# Patient Record
Sex: Female | Born: 1940 | Race: White | Hispanic: No | Marital: Married | Smoking: Former smoker
Health system: Southern US, Community
[De-identification: ages and names within clinical notes are randomized; demographics above are authoritative.]

## PROBLEM LIST (undated history)

## (undated) DIAGNOSIS — I4819 Other persistent atrial fibrillation: Secondary | ICD-10-CM

## (undated) DIAGNOSIS — E785 Hyperlipidemia, unspecified: Secondary | ICD-10-CM

## (undated) DIAGNOSIS — I351 Nonrheumatic aortic (valve) insufficiency: Secondary | ICD-10-CM

## (undated) DIAGNOSIS — I272 Pulmonary hypertension, unspecified: Secondary | ICD-10-CM

## (undated) DIAGNOSIS — C801 Malignant (primary) neoplasm, unspecified: Secondary | ICD-10-CM

## (undated) DIAGNOSIS — Z780 Asymptomatic menopausal state: Secondary | ICD-10-CM

## (undated) DIAGNOSIS — J392 Other diseases of pharynx: Secondary | ICD-10-CM

## (undated) DIAGNOSIS — J385 Laryngeal spasm: Secondary | ICD-10-CM

## (undated) DIAGNOSIS — I639 Cerebral infarction, unspecified: Secondary | ICD-10-CM

## (undated) DIAGNOSIS — K5790 Diverticulosis of intestine, part unspecified, without perforation or abscess without bleeding: Secondary | ICD-10-CM

## (undated) DIAGNOSIS — IMO0002 Reserved for concepts with insufficient information to code with codable children: Secondary | ICD-10-CM

## (undated) DIAGNOSIS — H409 Unspecified glaucoma: Secondary | ICD-10-CM

## (undated) DIAGNOSIS — N183 Chronic kidney disease, stage 3 unspecified: Secondary | ICD-10-CM

## (undated) DIAGNOSIS — E042 Nontoxic multinodular goiter: Secondary | ICD-10-CM

## (undated) DIAGNOSIS — K648 Other hemorrhoids: Secondary | ICD-10-CM

## (undated) DIAGNOSIS — I10 Essential (primary) hypertension: Secondary | ICD-10-CM

## (undated) DIAGNOSIS — R05 Cough: Secondary | ICD-10-CM

## (undated) DIAGNOSIS — J309 Allergic rhinitis, unspecified: Secondary | ICD-10-CM

## (undated) DIAGNOSIS — I340 Nonrheumatic mitral (valve) insufficiency: Secondary | ICD-10-CM

## (undated) DIAGNOSIS — F0390 Unspecified dementia without behavioral disturbance: Secondary | ICD-10-CM

## (undated) DIAGNOSIS — E039 Hypothyroidism, unspecified: Secondary | ICD-10-CM

## (undated) DIAGNOSIS — K449 Diaphragmatic hernia without obstruction or gangrene: Secondary | ICD-10-CM

## (undated) DIAGNOSIS — M1990 Unspecified osteoarthritis, unspecified site: Secondary | ICD-10-CM

## (undated) DIAGNOSIS — J45991 Cough variant asthma: Secondary | ICD-10-CM

## (undated) DIAGNOSIS — N882 Stricture and stenosis of cervix uteri: Secondary | ICD-10-CM

## (undated) DIAGNOSIS — R053 Chronic cough: Secondary | ICD-10-CM

## (undated) DIAGNOSIS — M8580 Other specified disorders of bone density and structure, unspecified site: Secondary | ICD-10-CM

## (undated) DIAGNOSIS — K219 Gastro-esophageal reflux disease without esophagitis: Secondary | ICD-10-CM

## (undated) DIAGNOSIS — I482 Chronic atrial fibrillation, unspecified: Secondary | ICD-10-CM

## (undated) DIAGNOSIS — I5032 Chronic diastolic (congestive) heart failure: Secondary | ICD-10-CM

## (undated) DIAGNOSIS — IMO0001 Reserved for inherently not codable concepts without codable children: Secondary | ICD-10-CM

## (undated) HISTORY — DX: Diverticulosis of intestine, part unspecified, without perforation or abscess without bleeding: K57.90

## (undated) HISTORY — PX: COLONOSCOPY: SHX174

## (undated) HISTORY — DX: Allergic rhinitis, unspecified: J30.9

## (undated) HISTORY — DX: Reserved for inherently not codable concepts without codable children: IMO0001

## (undated) HISTORY — DX: Unspecified glaucoma: H40.9

## (undated) HISTORY — DX: Chronic kidney disease, stage 3 (moderate): N18.3

## (undated) HISTORY — PX: BREAST SURGERY: SHX581

## (undated) HISTORY — DX: Reserved for concepts with insufficient information to code with codable children: IMO0002

## (undated) HISTORY — DX: Other specified disorders of bone density and structure, unspecified site: M85.80

## (undated) HISTORY — DX: Chronic kidney disease, stage 3 unspecified: N18.30

## (undated) HISTORY — DX: Chronic atrial fibrillation, unspecified: I48.20

## (undated) HISTORY — DX: Asymptomatic menopausal state: Z78.0

## (undated) HISTORY — DX: Essential (primary) hypertension: I10

## (undated) HISTORY — DX: Other persistent atrial fibrillation: I48.19

## (undated) HISTORY — DX: Stricture and stenosis of cervix uteri: N88.2

## (undated) HISTORY — PX: BREAST LUMPECTOMY: SHX2

## (undated) HISTORY — DX: Diaphragmatic hernia without obstruction or gangrene: K44.9

## (undated) HISTORY — DX: Gastro-esophageal reflux disease without esophagitis: K21.9

## (undated) HISTORY — DX: Other hemorrhoids: K64.8

## (undated) HISTORY — DX: Pulmonary hypertension, unspecified: I27.20

## (undated) HISTORY — DX: Other diseases of pharynx: J39.2

## (undated) HISTORY — DX: Nonrheumatic aortic (valve) insufficiency: I35.1

## (undated) HISTORY — DX: Chronic diastolic (congestive) heart failure: I50.32

## (undated) HISTORY — PX: EYE SURGERY: SHX253

## (undated) HISTORY — DX: Nonrheumatic mitral (valve) insufficiency: I34.0

## (undated) HISTORY — DX: Hyperlipidemia, unspecified: E78.5

## (undated) HISTORY — DX: Malignant (primary) neoplasm, unspecified: C80.1

## (undated) HISTORY — DX: Cough variant asthma: J45.991

## (undated) HISTORY — DX: Laryngeal spasm: J38.5

---

## 2001-01-19 ENCOUNTER — Ambulatory Visit (HOSPITAL_COMMUNITY): Admission: RE | Admit: 2001-01-19 | Discharge: 2001-01-19 | Payer: Self-pay | Admitting: Gastroenterology

## 2001-03-02 HISTORY — PX: LUNG CANCER SURGERY: SHX702

## 2005-05-22 ENCOUNTER — Encounter: Admission: RE | Admit: 2005-05-22 | Discharge: 2005-05-22 | Payer: Self-pay | Admitting: Family Medicine

## 2005-05-25 ENCOUNTER — Encounter: Admission: RE | Admit: 2005-05-25 | Discharge: 2005-05-25 | Payer: Self-pay | Admitting: Family Medicine

## 2005-05-27 ENCOUNTER — Ambulatory Visit: Payer: Self-pay | Admitting: Internal Medicine

## 2005-05-29 ENCOUNTER — Ambulatory Visit (HOSPITAL_COMMUNITY): Admission: RE | Admit: 2005-05-29 | Discharge: 2005-05-29 | Payer: Self-pay | Admitting: Internal Medicine

## 2005-06-19 ENCOUNTER — Encounter (INDEPENDENT_AMBULATORY_CARE_PROVIDER_SITE_OTHER): Payer: Self-pay | Admitting: *Deleted

## 2005-06-19 ENCOUNTER — Inpatient Hospital Stay (HOSPITAL_COMMUNITY): Admission: RE | Admit: 2005-06-19 | Discharge: 2005-06-23 | Payer: Self-pay | Admitting: Thoracic Surgery

## 2005-06-30 ENCOUNTER — Encounter: Admission: RE | Admit: 2005-06-30 | Discharge: 2005-06-30 | Payer: Self-pay | Admitting: Thoracic Surgery

## 2005-07-21 ENCOUNTER — Encounter: Admission: RE | Admit: 2005-07-21 | Discharge: 2005-07-21 | Payer: Self-pay | Admitting: Thoracic Surgery

## 2005-09-23 ENCOUNTER — Encounter: Admission: RE | Admit: 2005-09-23 | Discharge: 2005-09-23 | Payer: Self-pay | Admitting: Thoracic Surgery

## 2006-01-13 ENCOUNTER — Ambulatory Visit: Payer: Self-pay | Admitting: Family Medicine

## 2006-01-13 LAB — CONVERTED CEMR LAB
Chol/HDL Ratio, serum: 3
HCT: 42.1 % (ref 36.0–46.0)
Hemoglobin: 14.7 g/dL (ref 12.0–15.0)
MCHC: 35 g/dL (ref 30.0–36.0)
MCV: 91.3 fL (ref 78.0–100.0)
Platelets: 300 10*3/uL (ref 150–400)
RDW: 12 % (ref 11.5–14.6)
WBC: 5.1 10*3/uL (ref 4.5–10.5)

## 2006-01-25 ENCOUNTER — Ambulatory Visit: Payer: Self-pay | Admitting: Family Medicine

## 2006-01-25 LAB — CONVERTED CEMR LAB
Free T4: 0.9 ng/dL (ref 0.9–1.8)
TSH: 0.56 microintl units/mL (ref 0.35–5.50)

## 2006-01-27 ENCOUNTER — Encounter: Admission: RE | Admit: 2006-01-27 | Discharge: 2006-01-27 | Payer: Self-pay | Admitting: Thoracic Surgery

## 2006-05-03 ENCOUNTER — Ambulatory Visit: Payer: Self-pay | Admitting: Family Medicine

## 2006-05-12 ENCOUNTER — Ambulatory Visit: Payer: Self-pay | Admitting: Internal Medicine

## 2006-05-17 ENCOUNTER — Encounter: Admission: RE | Admit: 2006-05-17 | Discharge: 2006-05-17 | Payer: Self-pay | Admitting: Internal Medicine

## 2006-05-31 ENCOUNTER — Ambulatory Visit: Payer: Self-pay | Admitting: Family Medicine

## 2006-07-21 ENCOUNTER — Ambulatory Visit: Payer: Self-pay | Admitting: Family Medicine

## 2006-07-28 ENCOUNTER — Encounter: Admission: RE | Admit: 2006-07-28 | Discharge: 2006-07-28 | Payer: Self-pay | Admitting: Thoracic Surgery

## 2006-07-28 ENCOUNTER — Ambulatory Visit: Payer: Self-pay | Admitting: Thoracic Surgery

## 2006-07-29 DIAGNOSIS — J309 Allergic rhinitis, unspecified: Secondary | ICD-10-CM | POA: Insufficient documentation

## 2006-07-29 DIAGNOSIS — Z85118 Personal history of other malignant neoplasm of bronchus and lung: Secondary | ICD-10-CM | POA: Insufficient documentation

## 2007-01-12 ENCOUNTER — Ambulatory Visit: Payer: Self-pay | Admitting: Thoracic Surgery

## 2007-01-12 ENCOUNTER — Encounter: Admission: RE | Admit: 2007-01-12 | Discharge: 2007-01-12 | Payer: Self-pay | Admitting: Thoracic Surgery

## 2007-01-21 ENCOUNTER — Ambulatory Visit: Payer: Self-pay | Admitting: Family Medicine

## 2007-01-21 DIAGNOSIS — R053 Chronic cough: Secondary | ICD-10-CM | POA: Insufficient documentation

## 2007-01-21 DIAGNOSIS — R05 Cough: Secondary | ICD-10-CM

## 2007-01-21 DIAGNOSIS — D499 Neoplasm of unspecified behavior of unspecified site: Secondary | ICD-10-CM

## 2007-01-31 ENCOUNTER — Telehealth (INDEPENDENT_AMBULATORY_CARE_PROVIDER_SITE_OTHER): Payer: Self-pay | Admitting: *Deleted

## 2007-02-01 ENCOUNTER — Ambulatory Visit: Payer: Self-pay | Admitting: Family Medicine

## 2007-02-07 ENCOUNTER — Telehealth (INDEPENDENT_AMBULATORY_CARE_PROVIDER_SITE_OTHER): Payer: Self-pay | Admitting: *Deleted

## 2007-02-11 ENCOUNTER — Telehealth (INDEPENDENT_AMBULATORY_CARE_PROVIDER_SITE_OTHER): Payer: Self-pay | Admitting: *Deleted

## 2007-02-14 ENCOUNTER — Ambulatory Visit: Payer: Self-pay | Admitting: Family Medicine

## 2007-02-14 DIAGNOSIS — J392 Other diseases of pharynx: Secondary | ICD-10-CM

## 2007-02-25 ENCOUNTER — Telehealth (INDEPENDENT_AMBULATORY_CARE_PROVIDER_SITE_OTHER): Payer: Self-pay | Admitting: *Deleted

## 2007-02-25 ENCOUNTER — Ambulatory Visit: Payer: Self-pay | Admitting: Family Medicine

## 2007-03-04 ENCOUNTER — Ambulatory Visit: Payer: Self-pay | Admitting: Gastroenterology

## 2007-03-10 ENCOUNTER — Encounter (INDEPENDENT_AMBULATORY_CARE_PROVIDER_SITE_OTHER): Payer: Self-pay | Admitting: Family Medicine

## 2007-03-17 ENCOUNTER — Encounter (INDEPENDENT_AMBULATORY_CARE_PROVIDER_SITE_OTHER): Payer: Self-pay | Admitting: Family Medicine

## 2007-03-17 ENCOUNTER — Ambulatory Visit: Payer: Self-pay | Admitting: Gastroenterology

## 2007-03-17 LAB — HM COLONOSCOPY: HM Colonoscopy: NORMAL

## 2007-03-21 ENCOUNTER — Ambulatory Visit: Payer: Self-pay | Admitting: Internal Medicine

## 2007-03-24 ENCOUNTER — Ambulatory Visit: Payer: Self-pay | Admitting: Cardiology

## 2007-04-25 ENCOUNTER — Ambulatory Visit: Payer: Self-pay | Admitting: Internal Medicine

## 2007-05-06 ENCOUNTER — Other Ambulatory Visit: Admission: RE | Admit: 2007-05-06 | Discharge: 2007-05-06 | Payer: Self-pay | Admitting: Family Medicine

## 2007-05-06 ENCOUNTER — Ambulatory Visit: Payer: Self-pay | Admitting: Family Medicine

## 2007-05-06 ENCOUNTER — Encounter: Payer: Self-pay | Admitting: Family Medicine

## 2007-05-06 DIAGNOSIS — N882 Stricture and stenosis of cervix uteri: Secondary | ICD-10-CM | POA: Insufficient documentation

## 2007-05-06 DIAGNOSIS — N951 Menopausal and female climacteric states: Secondary | ICD-10-CM

## 2007-05-09 ENCOUNTER — Telehealth (INDEPENDENT_AMBULATORY_CARE_PROVIDER_SITE_OTHER): Payer: Self-pay | Admitting: *Deleted

## 2007-05-09 ENCOUNTER — Encounter: Payer: Self-pay | Admitting: Internal Medicine

## 2007-05-09 ENCOUNTER — Ambulatory Visit (HOSPITAL_COMMUNITY): Admission: RE | Admit: 2007-05-09 | Discharge: 2007-05-09 | Payer: Self-pay | Admitting: Internal Medicine

## 2007-05-16 ENCOUNTER — Encounter (INDEPENDENT_AMBULATORY_CARE_PROVIDER_SITE_OTHER): Payer: Self-pay | Admitting: *Deleted

## 2007-05-18 ENCOUNTER — Encounter: Payer: Self-pay | Admitting: Family Medicine

## 2007-05-19 ENCOUNTER — Encounter: Payer: Self-pay | Admitting: Family Medicine

## 2007-05-20 ENCOUNTER — Telehealth (INDEPENDENT_AMBULATORY_CARE_PROVIDER_SITE_OTHER): Payer: Self-pay | Admitting: *Deleted

## 2007-05-20 ENCOUNTER — Ambulatory Visit: Payer: Self-pay | Admitting: Family Medicine

## 2007-05-20 LAB — CONVERTED CEMR LAB
Bilirubin Urine: NEGATIVE
Blood in Urine, dipstick: NEGATIVE
Glucose, Urine, Semiquant: NEGATIVE
Nitrite: NEGATIVE
Protein, U semiquant: NEGATIVE
Urobilinogen, UA: 0.2
WBC Urine, dipstick: NEGATIVE
pH: 7.5

## 2007-05-24 ENCOUNTER — Ambulatory Visit: Payer: Self-pay | Admitting: Internal Medicine

## 2007-05-28 ENCOUNTER — Encounter: Payer: Self-pay | Admitting: Family Medicine

## 2007-05-28 DIAGNOSIS — M8580 Other specified disorders of bone density and structure, unspecified site: Secondary | ICD-10-CM

## 2007-05-28 LAB — CONVERTED CEMR LAB
AST: 24 units/L (ref 0–37)
Albumin: 4.2 g/dL (ref 3.5–5.2)
Basophils Absolute: 0 10*3/uL (ref 0.0–0.1)
Basophils Relative: 0.7 % (ref 0.0–1.0)
CO2: 30 meq/L (ref 19–32)
Chloride: 102 meq/L (ref 96–112)
Creatinine, Ser: 0.9 mg/dL (ref 0.4–1.2)
Direct LDL: 161.6 mg/dL
Eosinophils Relative: 0.1 % (ref 0.0–5.0)
Glucose, Bld: 97 mg/dL (ref 70–99)
HCT: 43.2 % (ref 36.0–46.0)
Hemoglobin: 14.6 g/dL (ref 12.0–15.0)
MCHC: 33.8 g/dL (ref 30.0–36.0)
Monocytes Absolute: 0.4 10*3/uL (ref 0.2–0.7)
Neutrophils Relative %: 54.8 % (ref 43.0–77.0)
Potassium: 4.1 meq/L (ref 3.5–5.1)
RBC: 4.73 M/uL (ref 3.87–5.11)
RDW: 11.6 % (ref 11.5–14.6)
Sodium: 138 meq/L (ref 135–145)
TSH: 0.36 microintl units/mL (ref 0.35–5.50)
Total Bilirubin: 1 mg/dL (ref 0.3–1.2)
Total CHOL/HDL Ratio: 3.2
Total Protein: 7.1 g/dL (ref 6.0–8.3)
Triglycerides: 56 mg/dL (ref 0–149)
WBC: 4.4 10*3/uL — ABNORMAL LOW (ref 4.5–10.5)

## 2007-05-30 ENCOUNTER — Telehealth (INDEPENDENT_AMBULATORY_CARE_PROVIDER_SITE_OTHER): Payer: Self-pay | Admitting: *Deleted

## 2007-05-31 ENCOUNTER — Encounter (INDEPENDENT_AMBULATORY_CARE_PROVIDER_SITE_OTHER): Payer: Self-pay | Admitting: *Deleted

## 2007-06-06 ENCOUNTER — Encounter: Payer: Self-pay | Admitting: Family Medicine

## 2007-06-09 ENCOUNTER — Encounter: Payer: Self-pay | Admitting: Family Medicine

## 2007-06-27 ENCOUNTER — Ambulatory Visit: Payer: Self-pay | Admitting: Pulmonary Disease

## 2007-06-27 ENCOUNTER — Telehealth (INDEPENDENT_AMBULATORY_CARE_PROVIDER_SITE_OTHER): Payer: Self-pay | Admitting: *Deleted

## 2007-06-27 DIAGNOSIS — J45991 Cough variant asthma: Secondary | ICD-10-CM | POA: Insufficient documentation

## 2007-07-13 ENCOUNTER — Ambulatory Visit: Payer: Self-pay | Admitting: Internal Medicine

## 2007-07-28 ENCOUNTER — Ambulatory Visit: Payer: Self-pay | Admitting: Internal Medicine

## 2007-08-24 ENCOUNTER — Ambulatory Visit: Payer: Self-pay | Admitting: Internal Medicine

## 2007-09-09 ENCOUNTER — Ambulatory Visit: Payer: Self-pay | Admitting: Internal Medicine

## 2007-10-19 ENCOUNTER — Ambulatory Visit: Payer: Self-pay | Admitting: Internal Medicine

## 2008-01-02 ENCOUNTER — Telehealth (INDEPENDENT_AMBULATORY_CARE_PROVIDER_SITE_OTHER): Payer: Self-pay | Admitting: *Deleted

## 2008-01-17 ENCOUNTER — Ambulatory Visit: Payer: Self-pay | Admitting: Internal Medicine

## 2008-02-07 ENCOUNTER — Ambulatory Visit: Payer: Self-pay | Admitting: Internal Medicine

## 2008-03-08 ENCOUNTER — Ambulatory Visit: Payer: Self-pay | Admitting: Internal Medicine

## 2008-04-11 ENCOUNTER — Ambulatory Visit: Payer: Self-pay | Admitting: Family Medicine

## 2008-04-11 ENCOUNTER — Emergency Department (HOSPITAL_COMMUNITY): Admission: EM | Admit: 2008-04-11 | Discharge: 2008-04-12 | Payer: Self-pay | Admitting: Emergency Medicine

## 2008-04-11 DIAGNOSIS — J040 Acute laryngitis: Secondary | ICD-10-CM | POA: Insufficient documentation

## 2008-04-12 ENCOUNTER — Encounter (INDEPENDENT_AMBULATORY_CARE_PROVIDER_SITE_OTHER): Payer: Self-pay | Admitting: *Deleted

## 2008-04-13 ENCOUNTER — Ambulatory Visit: Payer: Self-pay | Admitting: Internal Medicine

## 2008-04-25 ENCOUNTER — Encounter: Payer: Self-pay | Admitting: Family Medicine

## 2008-04-30 ENCOUNTER — Encounter: Payer: Self-pay | Admitting: Family Medicine

## 2008-05-01 ENCOUNTER — Telehealth (INDEPENDENT_AMBULATORY_CARE_PROVIDER_SITE_OTHER): Payer: Self-pay | Admitting: *Deleted

## 2008-05-28 ENCOUNTER — Ambulatory Visit: Payer: Self-pay | Admitting: Internal Medicine

## 2008-06-04 ENCOUNTER — Ambulatory Visit: Payer: Self-pay | Admitting: Family Medicine

## 2008-06-06 ENCOUNTER — Encounter (INDEPENDENT_AMBULATORY_CARE_PROVIDER_SITE_OTHER): Payer: Self-pay | Admitting: *Deleted

## 2008-06-06 LAB — CONVERTED CEMR LAB
ALT: 19 units/L (ref 0–35)
AST: 19 units/L (ref 0–37)
Albumin: 3.9 g/dL (ref 3.5–5.2)
BUN: 22 mg/dL (ref 6–23)
Chloride: 111 meq/L (ref 96–112)
Direct LDL: 135.5 mg/dL
Eosinophils Relative: 0.1 % (ref 0.0–5.0)
GFR calc non Af Amer: 66.25 mL/min (ref >60)
Glucose, Bld: 100 mg/dL — ABNORMAL HIGH (ref 70–99)
HCT: 41.4 % (ref 36.0–46.0)
HDL: 70.2 mg/dL (ref >39.00)
Hemoglobin: 14.4 g/dL (ref 12.0–15.0)
Lymphs Abs: 1 10*3/uL (ref 0.7–4.0)
MCV: 92.3 fL (ref 78.0–100.0)
Monocytes Absolute: 0.4 10*3/uL (ref 0.1–1.0)
Monocytes Relative: 5.9 % (ref 3.0–12.0)
Neutro Abs: 4.6 10*3/uL (ref 1.4–7.7)
Potassium: 4.3 meq/L (ref 3.5–5.1)
RDW: 12.3 % (ref 11.5–14.6)
Sodium: 144 meq/L (ref 135–145)
TSH: 0.21 microintl units/mL — ABNORMAL LOW (ref 0.35–5.50)
WBC: 6 10*3/uL (ref 4.5–10.5)

## 2008-06-26 ENCOUNTER — Ambulatory Visit: Payer: Self-pay | Admitting: Internal Medicine

## 2008-07-02 ENCOUNTER — Ambulatory Visit: Payer: Self-pay | Admitting: Family Medicine

## 2008-07-02 DIAGNOSIS — R0989 Other specified symptoms and signs involving the circulatory and respiratory systems: Secondary | ICD-10-CM

## 2008-07-02 DIAGNOSIS — R0609 Other forms of dyspnea: Secondary | ICD-10-CM | POA: Insufficient documentation

## 2008-07-03 ENCOUNTER — Encounter (INDEPENDENT_AMBULATORY_CARE_PROVIDER_SITE_OTHER): Payer: Self-pay | Admitting: *Deleted

## 2008-07-03 LAB — CONVERTED CEMR LAB
BUN: 22 mg/dL (ref 6–23)
CO2: 30 meq/L (ref 19–32)
Calcium: 9.1 mg/dL (ref 8.4–10.5)
Creatinine, Ser: 0.8 mg/dL (ref 0.4–1.2)
Glucose, Bld: 94 mg/dL (ref 70–99)

## 2008-07-13 ENCOUNTER — Ambulatory Visit: Payer: Self-pay | Admitting: Family Medicine

## 2008-07-17 ENCOUNTER — Telehealth (INDEPENDENT_AMBULATORY_CARE_PROVIDER_SITE_OTHER): Payer: Self-pay | Admitting: *Deleted

## 2008-08-06 ENCOUNTER — Telehealth: Payer: Self-pay | Admitting: Internal Medicine

## 2008-09-21 ENCOUNTER — Ambulatory Visit: Payer: Self-pay | Admitting: Internal Medicine

## 2008-11-20 ENCOUNTER — Telehealth: Payer: Self-pay | Admitting: Internal Medicine

## 2008-12-25 ENCOUNTER — Ambulatory Visit: Payer: Self-pay | Admitting: Internal Medicine

## 2009-01-01 ENCOUNTER — Ambulatory Visit: Payer: Self-pay | Admitting: Family Medicine

## 2009-01-07 ENCOUNTER — Ambulatory Visit: Payer: Self-pay | Admitting: Family Medicine

## 2009-01-07 DIAGNOSIS — L509 Urticaria, unspecified: Secondary | ICD-10-CM

## 2009-01-07 DIAGNOSIS — R21 Rash and other nonspecific skin eruption: Secondary | ICD-10-CM | POA: Insufficient documentation

## 2009-01-25 ENCOUNTER — Encounter: Payer: Self-pay | Admitting: Internal Medicine

## 2009-02-06 ENCOUNTER — Encounter: Payer: Self-pay | Admitting: Internal Medicine

## 2009-02-07 ENCOUNTER — Ambulatory Visit: Payer: Self-pay | Admitting: Internal Medicine

## 2009-02-07 DIAGNOSIS — H409 Unspecified glaucoma: Secondary | ICD-10-CM | POA: Insufficient documentation

## 2009-05-17 ENCOUNTER — Encounter (INDEPENDENT_AMBULATORY_CARE_PROVIDER_SITE_OTHER): Payer: Self-pay | Admitting: *Deleted

## 2009-05-17 ENCOUNTER — Encounter: Payer: Self-pay | Admitting: Family Medicine

## 2009-05-22 ENCOUNTER — Encounter: Payer: Self-pay | Admitting: Family Medicine

## 2009-05-22 ENCOUNTER — Encounter (INDEPENDENT_AMBULATORY_CARE_PROVIDER_SITE_OTHER): Payer: Self-pay | Admitting: *Deleted

## 2009-06-04 ENCOUNTER — Encounter (INDEPENDENT_AMBULATORY_CARE_PROVIDER_SITE_OTHER): Payer: Self-pay | Admitting: *Deleted

## 2009-06-04 ENCOUNTER — Ambulatory Visit: Payer: Self-pay | Admitting: Family Medicine

## 2009-06-04 ENCOUNTER — Telehealth (INDEPENDENT_AMBULATORY_CARE_PROVIDER_SITE_OTHER): Payer: Self-pay | Admitting: *Deleted

## 2009-06-04 DIAGNOSIS — R1310 Dysphagia, unspecified: Secondary | ICD-10-CM | POA: Insufficient documentation

## 2009-06-06 ENCOUNTER — Telehealth (INDEPENDENT_AMBULATORY_CARE_PROVIDER_SITE_OTHER): Payer: Self-pay | Admitting: *Deleted

## 2009-06-10 ENCOUNTER — Ambulatory Visit: Payer: Self-pay | Admitting: Gastroenterology

## 2009-06-10 ENCOUNTER — Encounter (INDEPENDENT_AMBULATORY_CARE_PROVIDER_SITE_OTHER): Payer: Self-pay | Admitting: *Deleted

## 2009-06-10 DIAGNOSIS — K5730 Diverticulosis of large intestine without perforation or abscess without bleeding: Secondary | ICD-10-CM

## 2009-06-10 DIAGNOSIS — R1319 Other dysphagia: Secondary | ICD-10-CM

## 2009-06-10 DIAGNOSIS — E785 Hyperlipidemia, unspecified: Secondary | ICD-10-CM | POA: Insufficient documentation

## 2009-07-03 ENCOUNTER — Ambulatory Visit: Payer: Self-pay | Admitting: Gastroenterology

## 2009-07-08 ENCOUNTER — Encounter: Payer: Self-pay | Admitting: Gastroenterology

## 2009-07-10 ENCOUNTER — Telehealth (INDEPENDENT_AMBULATORY_CARE_PROVIDER_SITE_OTHER): Payer: Self-pay | Admitting: *Deleted

## 2009-08-15 ENCOUNTER — Ambulatory Visit: Payer: Self-pay | Admitting: Internal Medicine

## 2009-08-15 DIAGNOSIS — H9190 Unspecified hearing loss, unspecified ear: Secondary | ICD-10-CM | POA: Insufficient documentation

## 2009-08-15 DIAGNOSIS — H6120 Impacted cerumen, unspecified ear: Secondary | ICD-10-CM

## 2009-08-21 ENCOUNTER — Ambulatory Visit: Payer: Self-pay | Admitting: Family Medicine

## 2009-08-22 LAB — CONVERTED CEMR LAB
ALT: 18 units/L (ref 0–35)
Total Bilirubin: 0.6 mg/dL (ref 0.3–1.2)
Total Protein: 6.9 g/dL (ref 6.0–8.3)

## 2009-09-03 ENCOUNTER — Telehealth (INDEPENDENT_AMBULATORY_CARE_PROVIDER_SITE_OTHER): Payer: Self-pay | Admitting: *Deleted

## 2009-09-26 ENCOUNTER — Ambulatory Visit: Payer: Self-pay | Admitting: Gastroenterology

## 2009-09-26 DIAGNOSIS — K219 Gastro-esophageal reflux disease without esophagitis: Secondary | ICD-10-CM | POA: Insufficient documentation

## 2009-09-26 DIAGNOSIS — K222 Esophageal obstruction: Secondary | ICD-10-CM

## 2009-11-28 ENCOUNTER — Ambulatory Visit: Payer: Self-pay | Admitting: Family Medicine

## 2009-11-28 LAB — CONVERTED CEMR LAB
ALT: 19 units/L (ref 0–35)
AST: 23 units/L (ref 0–37)
Albumin: 4.2 g/dL (ref 3.5–5.2)
Alkaline Phosphatase: 59 units/L (ref 39–117)
Calcium: 9.3 mg/dL (ref 8.4–10.5)
Cholesterol: 157 mg/dL (ref 0–200)
Creatinine, Ser: 0.8 mg/dL (ref 0.4–1.2)
Total Protein: 6.8 g/dL (ref 6.0–8.3)
Triglycerides: 44 mg/dL (ref 0.0–149.0)

## 2010-01-15 ENCOUNTER — Telehealth (INDEPENDENT_AMBULATORY_CARE_PROVIDER_SITE_OTHER): Payer: Self-pay | Admitting: *Deleted

## 2010-01-28 ENCOUNTER — Telehealth (INDEPENDENT_AMBULATORY_CARE_PROVIDER_SITE_OTHER): Payer: Self-pay | Admitting: *Deleted

## 2010-03-23 ENCOUNTER — Encounter: Payer: Self-pay | Admitting: Thoracic Surgery

## 2010-03-30 LAB — CONVERTED CEMR LAB
ALT: 18 units/L (ref 0–35)
Alkaline Phosphatase: 63 units/L (ref 39–117)
BUN: 24 mg/dL — ABNORMAL HIGH (ref 6–23)
Basophils Absolute: 0 10*3/uL (ref 0.0–0.1)
Bilirubin, Direct: 0.1 mg/dL (ref 0.0–0.3)
Calcium: 9.1 mg/dL (ref 8.4–10.5)
Creatinine, Ser: 0.9 mg/dL (ref 0.4–1.2)
GFR calc non Af Amer: 66.05 mL/min (ref >60)
Glucose, Bld: 78 mg/dL (ref 70–99)
HCT: 42 % (ref 36.0–46.0)
HDL: 73.2 mg/dL (ref >39.00)
Lymphs Abs: 1.6 10*3/uL (ref 0.7–4.0)
Monocytes Relative: 7.6 % (ref 3.0–12.0)
Neutrophils Relative %: 59.5 % (ref 43.0–77.0)
Platelets: 305 10*3/uL (ref 150.0–400.0)
Potassium: 4.2 meq/L (ref 3.5–5.1)
RDW: 13 % (ref 11.5–14.6)
TSH: 0.36 microintl units/mL (ref 0.35–5.50)
Total Protein: 6.7 g/dL (ref 6.0–8.3)

## 2010-04-01 NOTE — Miscellaneous (Signed)
Summary: omeprazole prescription  Clinical Lists Changes  Medications: Added new medication of OMEPRAZOLE 20 MG  CPDR (OMEPRAZOLE) 1 each day 30 minutes before meal - Signed Rx of OMEPRAZOLE 20 MG  CPDR (OMEPRAZOLE) 1 each day 30 minutes before meal;  #30 x 11;  Signed;  Entered by: Laverna Peace RN;  Authorized by: Meryl Dare MD [facility];  Method used: [facility]  [address] [facility] [address] [place], [place], [place]  [address], Ph: [phone] or [phone], Fax: [phone]    Prescriptions: OMEPRAZOLE 20 MG  CPDR (OMEPRAZOLE) 1 each day 30 minutes before meal  #30 x 11   Entered by:   Laverna Peace RN   Authorized by:   Meryl Dare MD [facility]   Signed by:   Laverna Peace RN on 07/03/2009   Method used:   [facility]ically to        [facility]  [address] [facility]       [address]       [place], [place]  [address]       Ph: [phone] or [phone]       Fax: [phone]   RxID:   [id number]

## 2010-04-01 NOTE — Assessment & Plan Note (Signed)
Summary: RIGHT EAR PAIN--PH   Vital Signs:  Patient profile:   70 year old female Weight:      135.4 pounds Temp:     98.0 degrees F oral Pulse rate:   64 / minute Resp:     14 per minute BP sitting:   118 / 62  (left arm) Cuff size:   regular  Vitals Entered By: Shonna Chock (August 15, 2009 3:04 PM) CC: Right ear feels blocked Comments REVIEWED MED LIST, PATIENT AGREED DOSE AND INSTRUCTION CORRECT    Primary Care Provider:  Neena Rhymes, MD   CC:  Right ear feels blocked.  History of Present Illness: Pressure in R ear for several weeks  with decreased hearing . No better with flushing & drops.She has used Qtips . No URI symptoms.  Allergies: 1)  ! Sulfa  Review of Systems General:  Denies chills, fever, and sweats. ENT:  Denies nasal congestion, ringing in ears, and sinus pressure; No facial pain , purulence, or frontal headache. Allergy:  Denies itching eyes and sneezing.  Physical Exam  General:  well-nourished,in no acute distress; alert,appropriate and cooperative throughout examination Eyes:  No corneal or conjunctival inflammation noted. Ears:  L ear normal.  Complete occlusion with dramatic hearing loss on  R. After soaking & gavage ; the impaction was removed in toto .TM normal ; whisper heard @ 6 feet after procedure. Nose:  External nasal examination shows no deformity or inflammation. Nasal mucosa are pink and moist without lesions or exudates. Mouth:  Oral mucosa and oropharynx without lesions or exudates.  Teeth in good repair. Cervical Nodes:  No lymphadenopathy noted Axillary Nodes:  No palpable lymphadenopathy   Impression & Recommendations:  Problem # 1:  HEARING LOSS, UNSPEC. (ICD-389.9)  Problem # 2:  IMPACTED CERUMEN (ICD-380.4)  Complete Medication List: 1)  Flonase 50 Mcg/act Susp (Fluticasone propionate) .... One spray each nostril twice dialy 2)  Adult Aspirin Low Strength 81 Mg Tbdp (Aspirin) .Marland Kitchen.. 1 by mouth daily 3)  Multivitamin/iron  Tabs (Multiple vitamins-iron) .Marland Kitchen.. 1 by mouth daily 4)  Diovan 80 Mg Tabs (Valsartan) .Marland Kitchen.. 1 by mouth daily 5)  Neurontin 400 Mg Caps (Gabapentin) .... One twice daily 6)  Chlor-trimeton 4 Mg Tabs (Chlorpheniramine maleate) .... One every 6 hours as needed for nasal drainage 7)  Tramadol Hcl 50 Mg Tabs (Tramadol hcl) .... One to two by mouth every 4-6 hours as needed 8)  Ventolin Hfa 108 (90 Base) Mcg/act Aers (Albuterol sulfate) .... 2 puffs every 4 hours as needed with spacer 9)  Symbicort 80-4.5 Mcg/act Aero (Budesonide-formoterol fumarate) .... 2 puffs two times a day 10)  Lumigan 0.03 % Soln (Bimatoprost) .Marland Kitchen.. 1 drop each eye every evening 11)  Istalol 0.5 % Soln (Timolol maleate) .Marland Kitchen.. 1 drop each eye every am 12)  Simvastatin 40 Mg Tabs (Simvastatin) .... Take one tablet at bedtime 13)  Omeprazole 20 Mg Cpdr (Omeprazole) .Marland Kitchen.. 1 each day 30 minutes before meal  Patient Instructions: 1)  Ear Hygiene as discussed (written instructions provided).

## 2010-04-01 NOTE — Assessment & Plan Note (Signed)
Summary: 6 month roa//lch   Vital Signs:  Patient profile:   70 year old female Height:      58 inches (147.32 cm) Weight:      132.38 pounds (60.17 kg) BMI:     27.77 Temp:     97.0 degrees F (36.11 degrees C) oral BP sitting:   130 / 78  (left arm)  Vitals Entered By: Lucious Groves CMA (November 28, 2009 9:35 AM) CC: 6 mo rtn ov./kb Is Patient Diabetic? No Pain Assessment Patient in pain? no      Comments Patient notes that she would like flu vacc. today and did come fasting just in case labs are needed./kb   History of Present Illness: 70 yo woman here today for f/u on  1) HTN- BP well controlled on Diovan.  no CP, SOB, HAs, visual changes, edema  2) hyperlipidemia- tolerating Simvastatin w/out difficulty.  no N/V, abd pain, myalgias.  Current Medications (verified): 1)  Adult Aspirin Low Strength 81 Mg  Tbdp (Aspirin) .Marland Kitchen.. 1 By Mouth Daily 2)  Multivitamin/iron   Tabs (Multiple Vitamins-Iron) .Marland Kitchen.. 1 By Mouth Daily 3)  Diovan 80 Mg Tabs (Valsartan) .Marland Kitchen.. 1 By Mouth Daily 4)  Neurontin 400 Mg Caps (Gabapentin) .... One Twice Daily 5)  Chlor-Trimeton 4 Mg Tabs (Chlorpheniramine Maleate) .... One Every 6 Hours As Needed For Nasal Drainage 6)  Tramadol Hcl 50 Mg  Tabs (Tramadol Hcl) .... One To Two By Mouth Every 4-6 Hours As Needed 7)  Ventolin Hfa 108 (90 Base) Mcg/act Aers (Albuterol Sulfate) .... 2 Puffs Every 4 Hours As Needed With Spacer 8)  Symbicort 80-4.5 Mcg/act Aero (Budesonide-Formoterol Fumarate) .... 2 Puffs Two Times A Day 9)  Lumigan 0.03 % Soln (Bimatoprost) .Marland Kitchen.. 1 Drop Each Eye Every Evening 10)  Istalol 0.5 % Soln (Timolol Maleate) .Marland Kitchen.. 1 Drop Each Eye Every Am 11)  Simvastatin 40 Mg Tabs (Simvastatin) .... Take One Tablet At Bedtime 12)  Omeprazole 20 Mg  Cpdr (Omeprazole) .Marland Kitchen.. 1 Each Day 30 Minutes Before Meal  Allergies (verified): 1)  ! 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 No apparent alopecia or balding. Lungs:  Normal respiratory effort, chest expands symmetrically. Lungs are clear to auscultation, no crackles or wheezes. Heart:  Normal rate and regular rhythm. S1 and S2 normal without gallop, murmur, click, rub or other extra sounds. Abdomen:  Bowel sounds positive,abdomen soft and non-tender without masses, organomegaly or hernias noted. Pulses:  +2 carotid, radial, DP Extremities:  No clubbing, cyanosis, edema, or deformity noted    Impression & Recommendations:  Problem # 1:  HYPERTENSION, BENIGN ESSENTIAL (ICD-401.1) Assessment Unchanged BP adequately controlled.  asymptomatic.  check labs. Her updated medication list for this problem includes:    Diovan 80 Mg Tabs (Valsartan) .Marland Kitchen... 1 by mouth daily  Orders: TLB-BMP (Basic Metabolic Panel-BMET) (80048-METABOL) Specimen Handling ([address])  Problem # 2:  HYPERLIPIDEMIA (ICD-272.4) Assessment: Unchanged tolerating statin.  due for labs.  adjust meds as needed based on lab results Her updated medication list for this problem includes:    Simvastatin 40 Mg Tabs (Simvastatin) .Marland KitchenMarland KitchenMarland KitchenMarland Kitchen  Take one tablet at bedtime  Orders: Venipuncture ([address]) TLB-Lipid Panel (80061-LIPID) TLB-Hepatic/Liver Function Pnl (80076-HEPATIC) Specimen Handling ([address])  Complete Medication List: 1)  Adult Aspirin Low Strength 81 Mg Tbdp (Aspirin) .Marland Kitchen.. 1 by mouth daily 2)  Multivitamin/iron Tabs (Multiple vitamins-iron) .Marland Kitchen.. 1 by mouth daily 3)  Diovan 80 Mg Tabs (Valsartan) .Marland Kitchen.. 1 by mouth daily 4)  Neurontin 400 Mg Caps  (Gabapentin) .... One twice daily 5)  Chlor-trimeton 4 Mg Tabs (Chlorpheniramine maleate) .... One every 6 hours as needed for nasal drainage 6)  Tramadol Hcl 50 Mg Tabs (Tramadol hcl) .... One to two by mouth every 4-6 hours as needed 7)  Ventolin Hfa 108 (90 Base) Mcg/act Aers (Albuterol sulfate) .... 2 puffs every 4 hours as needed with spacer 8)  Symbicort 80-4.5 Mcg/act Aero (Budesonide-formoterol fumarate) .... 2 puffs two times a day 9)  Lumigan 0.03 % Soln (Bimatoprost) .Marland Kitchen.. 1 drop each eye every evening 10)  Istalol 0.5 % Soln (Timolol maleate) .Marland Kitchen.. 1 drop each eye every am 11)  Simvastatin 40 Mg Tabs (Simvastatin) .... Take one tablet at bedtime 12)  Omeprazole 20 Mg Cpdr (Omeprazole) .Marland Kitchen.. 1 each day 30 minutes before meal  Other Orders: Flu Vaccine 69yrs + MEDICARE PATIENTS (U9811) Administration Flu vaccine - MCR (B1478) Prescription Created Electronically [phone])  Patient Instructions: 1)  Please schedule a follow-up appointment in 6 months for your complete physical. 2)  We'll notify you of your lab results 3)  Keep up the good work- you look fantastic! 4)  Call with any questions or concerns 5)  Have a great holiday season!!!  Prescriptions: OMEPRAZOLE 20 MG  CPDR (OMEPRAZOLE) 1 each day 30 minutes before meal  #90 x 3   Entered and Authorized by:   Neena Rhymes MD   Signed by:   Neena Rhymes MD on 11/28/2009   Method used:   Faxed to ...       [facility] education [facility])       [address] [address]       [place], [place]  [address]       Ph: [phone]       Fax: [phone]   RxID:   [id number]     Flu Vaccine Consent Questions     Do you have a history of severe allergic reactions to this vaccine? no    Any prior history of allergic reactions to egg and/or gelatin? no    Do you have a sensitivity to the preservative Thimersol? no    Do you have a past history of Guillan-Barre Syndrome? no    Do you currently have an acute febrile illness? no    Have you  ever had a severe reaction to latex? no    Vaccine information given and explained to patient? yes    Are you currently pregnant? no    Lot Number:[id number]   Exp Date:08/30/2010   Site Given  Left Deltoid IMdflu

## 2010-04-01 NOTE — Letter (Signed)
Summary: Patient [facility] Biopsy Results  [facility]  [address] [place], [place] [address]   Phone: [phone]  Fax: [phone]        Jul 08, 2009 MRN: [id number]    [facility] [address] [place], [place]  [address]    Dear Ms. Alcocer,  I am pleased to inform you that the biopsies taken during your recent endoscopic examination did not show any evidence of cancer upon pathologic examination. The biopsies showed a mild fungal infection.  Please keep your scheduled return visit to review your condition.  Continue with the treatment plan as outlined on the day of your      exam.  Please call us if you are having persistent problems or have questions about your condition that have not been fully answered at this time.  Sincerely,  Meryl Dare MD [facility]  This letter has been electronically signed by your physician.  Appended Document: Patient Notice-Endo Biopsy Results letter mailed

## 2010-04-01 NOTE — Procedures (Signed)
Summary: Upper Endoscopy w/DIL  Patient: Patricia [name] Note: All result statuses are Final unless otherwise noted.  Tests: (1) Upper Endoscopy w/DIL (UED)  UED Upper Endoscopy w/DIL                             DONE     [facility]     [address]     [place], [place]  [address]           ENDOSCOPY PROCEDURE REPORT           PATIENT:  Patricia [name], Patricia [name]  MR#:  [id number]     BIRTHDATE:  1940/06/07, 68 yrs. old  GENDER:  female     ENDOSCOPIST:  Judie Petit T. Russella Dar, MD, [facility]           PROCEDURE DATE:  07/03/2009     PROCEDURE:  EGD with dilatation over guidewire, EGD with biopsy     ASA CLASS:  Class II     INDICATIONS:  1) dysphagia     MEDICATIONS:  Fentanyl 25 mcg IV, Versed 5 mg IV     TOPICAL ANESTHETIC:  Exactacain Spray     DESCRIPTION OF PROCEDURE:   After the risks benefits and     alternatives of the procedure were thoroughly explained, informed     consent was obtained.  The [id number] endoscope was     introduced through the mouth and advanced to the second portion of     the duodenum, without limitations.  The instrument was slowly     withdrawn as the mucosa was carefully examined.     <<PROCEDUREIMAGES>>           A stricture was found in the distal esophagus. It was     circumferential and benign appearing. Multiple biopsies were     obtained and sent to pathology.  Esophagitis was found in the     distal esophagus. It was erosive. LA Classification Grade A.     Multiple erosions were found in the cardia. They were linear     arrayed and associate with the [facility]. Mild gastritis was found in the     body and the antrum of the stomach. It was erosive and     erythematous. Multiple biopsies were obtained and sent to     pathology.  The duodenal bulb was normal in appearance, as was the     postbulbar duodenum.  A hiatal hernia was found in the cardia. It     was 4 cm in size.  The examination with otherwise normal. 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 Russella Dar, MD, [facility]           CC:  Helane Rima. Beverely Low, M.D.           n.     eSIGNEDVenita Lick. Stark at 07/03/2009 03:38 PM           Patricia [name], [id number]  Note: An exclamation mark (!) indicates a result that was not dispersed into the flowsheet. Document Creation Date: 07/03/2009 3:39 PM _______________________________________________________________________  (1) Order result status: Final Collection or observation date-time: 07/03/2009 15:29 Requested date-time:  Receipt date-time:  Reported date-time:  Referring Physician:   Ordering Physician: Claudette Head [phone]) Specimen Source:  Source: Launa Grill Order Number: [id number] Lab site:

## 2010-04-01 NOTE — Progress Notes (Signed)
Summary: dx for lft -   Phone Note Call from Patient   Caller: Patient Summary of Call:  patient was called on [id number] to start simvastatin & have lab lft  6-8 weeks after starting -lab scheduled [id number] -is that the only lab - also need dx for lft Initial call taken by: Okey Regal Spring,  Jul 10, 2009 10:15 AM  Follow-up for Phone Call        pt needs liver function test (LFT) 6 - 8 weeks after starting med  Dx is Hyperlipidemia 272.4  dx added to lab order Follow-up by: Okey Regal Spring,  Jul 10, 2009 4:35 PM

## 2010-04-01 NOTE — Progress Notes (Signed)
Summary: flonase  Phone Note Refill Request Message from:  Patient  Refills Requested: Medication #1:  FLONASE 50 MCG/ACT SUSP one spray each nostril twice dialy [facility] - ph  [phone] -----     Method Requested: Fax to [facility] Initial call taken by: Okey Regal Spring,  Jul 10, 2009 10:08 AM    Prescriptions: Aleda Grana 50 MCG/ACT SUSP (FLUTICASONE PROPIONATE) one spray each nostril twice dialy  #3 x 1   Entered by:   Doristine Devoid   Authorized by:   Neena Rhymes MD   Signed by:   Doristine Devoid on 07/10/2009   Method used:   Faxed to ...       [facility])       [address] [address]       [place], [place]  [address]       Ph: [phone]       Fax: [phone]   RxID:   [id number]

## 2010-04-01 NOTE — Progress Notes (Signed)
Summary: discuss labs  Phone Note Outgoing Call   Call placed by: Doristine Devoid,  June 06, 2009 4:30 PM Call placed to: Patient Summary of Call: pt's total cholesterol and LDL are elevated.  needs to start Simvastatin 40mg  at bedtime. recheck LFTs in 6-8 weeks.   Follow-up for Phone Call        left message on machine ..........Marland KitchenDoristine Devoid  June 06, 2009 4:30 PM   spoke w/ patient aware of labs and start of medication .Marland KitchenMarland KitchenMarland KitchenDoristine Devoid  June 06, 2009 4:44 PM     New/Updated Medications: SIMVASTATIN 40 MG TABS (SIMVASTATIN) take one tablet at bedtime Prescriptions: SIMVASTATIN 40 MG TABS (SIMVASTATIN) take one tablet at bedtime  #90 x 0   Entered by:   Doristine Devoid   Authorized by:   Neena Rhymes MD   Signed by:   Doristine Devoid on 06/06/2009   Method used:   Faxed to ...       [facility] education [facility])       [address] [address]       [place], [place]  [address]       Ph: [phone]       Fax: [phone]   RxID:   [id number]

## 2010-04-01 NOTE — Progress Notes (Signed)
Summary: diovan refill   Phone Note Refill Request Message from:  Patient on [facility]  Refills Requested: Medication #1:  DIOVAN 80 MG TABS 1 by mouth daily   Dosage confirmed as above?Dosage Confirmed   Supply Requested: 3 months Next Appointment Scheduled: just seen today Initial call taken by: Harold Barban,  June 04, 2009 9:47 AM    Prescriptions: DIOVAN 80 MG TABS (VALSARTAN) 1 by mouth daily  #90 x 3   Entered by:   Doristine Devoid   Authorized by:   Neena Rhymes MD   Signed by:   Doristine Devoid on 06/04/2009   Method used:   Faxed to ...       [facility] education [facility])       [address] [address]       [place], [place]  [address]       Ph: [phone]       Fax: [phone]   RxID:   [id number]

## 2010-04-01 NOTE — Miscellaneous (Signed)
Summary: additonal view of left breast   Clinical Lists Changes  Observations: Added new observation of MAMMOGRAM: normal left (05/22/2009 8:42)      Preventive Care Screening  Mammogram:    Date:  05/22/2009    Results:  normal left

## 2010-04-01 NOTE — Progress Notes (Signed)
Summary: simvastatin refill   Phone Note Refill Request Message from:  Patient  Refills Requested: Medication #1:  SIMVASTATIN 40 MG TABS take one tablet at bedtime [facility]   Initial call taken by: Okey Regal Spring,  September 03, 2009 1:08 PM    Prescriptions: SIMVASTATIN 40 MG TABS (SIMVASTATIN) take one tablet at bedtime  #90 x 1   Entered by:   Doristine Devoid   Authorized by:   Neena Rhymes MD   Signed by:   Doristine Devoid on 09/03/2009   Method used:   Faxed to ...       [facility] Environmental education [facility])       [address] [address]       [place], [place]  [address]       Ph: [phone]       Fax: [phone]   RxID:   [id number]

## 2010-04-01 NOTE — Letter (Signed)
Summary: New Patient letter  [facility]  [address] [place], [place] [address]   Phone: [phone]  Fax: [phone]       06/04/2009 MRN: [id number]  [facility] [address] [place], [place]  [address]  Dear Patricia [name],  Welcome to the Gastroenterology Division at [facility]    You are scheduled to see Dr.  Russella Dar on 07-22-09 at 10:15a.m. on the 3rd floor at [facility], [address]  We ask that you try to arrive at our office 15 minutes prior to your appointment time to allow for check-in.  We would like you to complete the enclosed self-administered evaluation form prior to your visit and bring it with you on the day of your appointment.  We will review it with you.  Also, please bring a complete list of all your medications or, if you prefer, bring the medication bottles and we will list them.  Please bring your insurance card so that we may make a copy of it.  If your insurance requires a referral to see a specialist, please bring your referral form from your primary care physician.  Co-payments are due at the time of your visit and may be paid by cash, check or credit card.     Your office visit will consist of a consult with your physician (includes a physical exam), any laboratory testing he/she may order, scheduling of any necessary diagnostic testing (e.g. x-ray, ultrasound, CT-scan), and scheduling of a procedure (e.g. Endoscopy, Colonoscopy) if required.  Please allow enough time on your schedule to allow for any/all of these possibilities.    If you cannot keep your appointment, please call [phone] to cancel or reschedule prior to your appointment date.  This allows [name] the opportunity to schedule an appointment for another patient in need of care.  If you do not cancel or reschedule by 5 p.m. the business day prior to your appointment date, you will be charged a $50.00 late cancellation/no-show fee.    Thank you for choosing  [facility] for your medical needs.  We appreciate the opportunity to care for you.  Please visit [name] at our website  to learn more about our practice.                     Sincerely,                                                             The Gastroenterology Division

## 2010-04-01 NOTE — Procedures (Signed)
Summary: Colonoscopy   Colonoscopy  Procedure date:  03/17/2007  Findings:      Location:  [facility].    Colonoscopy  Procedure date:  03/17/2007  Findings:      Location:  [facility].   Patient Name: Patricia [name], Patricia [name] MRN:  Procedure Procedures: Colorectal cancer screening, average risk CPT: G0121.  Personnel: Endoscopist: Venita Lick. Russella Dar, MD, [facility].  Referred By: Leanne Chang, MD.  Exam Location: Exam performed in Outpatient Clinic. Outpatient  Patient Consent: Procedure, Alternatives, Risks and Benefits discussed, consent obtained, from patient. Consent was obtained by the RN.  Indications  Average Risk Screening Routine.  History  Current Medications: Patient is not currently taking Coumadin.  Pre-Exam Physical: Performed Mar 17, 2007. Cardio-pulmonary exam, Rectal exam, HEENT exam , Abdominal exam, Mental status exam WNL.  Comments: Pt. history reviewed/updated, physical exam performed prior to initiation of sedation?Yes Exam Exam: Extent of exam reached: Cecum, extent intended: Cecum.  The cecum was identified by appendiceal orifice and IC valve. Time to Cecum: 00:04: 30. Time for Withdrawl: 00:11:08. Colon retroflexion performed. ASA Classification: II. Tolerance: excellent.  Monitoring: Pulse and BP monitoring, Oximetry used. Supplemental O2 given.  Colon Prep Used MoviPrep for colon prep. Prep results: excellent.  Sedation Meds: Patient assessed and found to be appropriate for moderate (conscious) sedation. Fentanyl 50 mcg. given IV. Versed 7 mg. given IV.  Findings - DIVERTICULOSIS: Sigmoid Colon. Not bleeding. ICD9: Diverticulosis: 562.10.  NORMAL EXAM: Cecum to Descending Colon.  HEMORRHOIDS: Internal. Size: Small. Not bleeding. Not thrombosed. ICD9: Hemorrhoids, Internal: 455.0.   Assessment  Diagnoses: 562.10: Diverticulosis.  455.0: Hemorrhoids, Internal.   Events  Unplanned Interventions: No  intervention was required.  Unplanned Events: There were no complications. Plans  Post Exam Instructions: Post sedation instructions given.  Patient Education: Patient given standard instructions for: Diverticulosis. Hemorrhoids.  Disposition: After procedure patient sent to recovery. After recovery patient sent home.  Scheduling/Referral: Colonoscopy, to [facility] T. Russella Dar, MD, [facility], around Mar 16, 2017.    cc: Leanne Chang, MD  This report was created from the original endoscopy report, which was reviewed and signed by the above listed endoscopist.

## 2010-04-01 NOTE — Assessment & Plan Note (Signed)
Summary: dysphagia--ch.   History of Present Illness Visit Type: Follow-up Visit Primary GI MD: Elie Goody MD [facility] Primary Provider: Neena Rhymes, MD  Requesting Provider: Neena Rhymes, MD  Chief Complaint: dysphagia with solids  History of Present Illness:   This is a 70 year old female with a 6 month history of solid food dysphasia. Her symptoms have occurred intermittently and mainly with breads. She has had to induce vomiting on several occasions. She has no reflux symptoms and no prior history of swallowing difficulties.  She underwent colonoscopy for routine colorectal cancer screening in January 2009.   GI Review of Systems    Reports dysphagia with solids.      Denies abdominal pain, acid reflux, belching, bloating, chest pain, dysphagia with liquids, heartburn, loss of appetite, nausea, vomiting, vomiting blood, weight loss, and  weight gain.        Denies anal fissure, black tarry stools, change in bowel habit, constipation, diarrhea, diverticulosis, fecal incontinence, heme positive stool, hemorrhoids, irritable bowel syndrome, jaundice, light color stool, liver problems, rectal bleeding, and  rectal pain.   Current Medications (verified): 1)  Flonase 50 Mcg/act Susp (Fluticasone Propionate) .... One Spray Each Nostril Twice Dialy 2)  Adult Aspirin Low Strength 81 Mg  Tbdp (Aspirin) .Marland Kitchen.. 1 By Mouth Daily 3)  Multivitamin/iron   Tabs (Multiple Vitamins-Iron) .Marland Kitchen.. 1 By Mouth Daily 4)  Diovan 80 Mg Tabs (Valsartan) .Marland Kitchen.. 1 By Mouth Daily 5)  Neurontin 400 Mg Caps (Gabapentin) .... One Twice Daily 6)  Chlor-Trimeton 4 Mg Tabs (Chlorpheniramine Maleate) .... One Every 6 Hours As Needed For Nasal Drainage 7)  Tramadol Hcl 50 Mg  Tabs (Tramadol Hcl) .... One To Two By Mouth Every 4-6 Hours As Needed 8)  Ventolin Hfa 108 (90 Base) Mcg/act Aers (Albuterol Sulfate) .... 2 Puffs Every 4 Hours As Needed With Spacer 9)  Symbicort 80-4.5 Mcg/act Aero (Budesonide-Formoterol  Fumarate) .... 2 Puffs Two Times A Day 10)  Lumigan 0.03 % Soln (Bimatoprost) .Marland Kitchen.. 1 Drop Each Eye Every Evening 11)  Istalol 0.5 % Soln (Timolol Maleate) .Marland Kitchen.. 1 Drop Each Eye Every Am 12)  Simvastatin 40 Mg Tabs (Simvastatin) .... Take One Tablet At Bedtime  Allergies (verified): 1)  ! Sulfa  Past History:  Past Medical History: Reviewed history from 06/10/2009 and no changes required. 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 lung cancer resection carcinoid lingula April 2007 breast biopsy  Family History: Family History Stroke-- F and M F--TIA, carotid artery stenosis  Brother and sister have sinus problems otherwise no resp dz or atopy mother w/ HTN No FH of Colon Cancer:  Social History: Retired  Married Alcohol use-yes Drug use-no Regular exercise-yes Patient is a former smoker: Quit 1965  Smoking Status:  quit  Review of Systems       The pertinent positives  and negatives are noted as above and in the HPI. All other ROS were reviewed and were negative.   Vital Signs:  Patient profile:   70 year old female Height:      58 inches Weight:      136 pounds BMI:     28.53 BSA:      1.55 Pulse rate:   72 / minute Pulse rhythm:   regular BP sitting:   132 / 84  (left arm) Cuff size:   regular  Vitals Entered By: Ok Anis CMA (June 10, 2009 2:57 PM)  Physical Exam  General:  Well developed, well nourished, no acute distress. Head:  Normocephalic and atraumatic. Eyes:  PERRLA, no icterus. Ears:  Normal auditory acuity. Mouth:  No deformity or lesions, dentition normal. Neck:  Supple; no masses or thyromegaly. Lungs:  Clear throughout to auscultation. Heart:  Regular rate and rhythm; no murmurs, rubs,  or bruits. Abdomen:  Soft, nontender and nondistended. No masses, hepatosplenomegaly or hernias noted. Normal bowel sounds. Msk:  Symmetrical with no gross deformities. Normal posture. Pulses:  Normal pulses noted. Extremities:  No clubbing, cyanosis, edema or deformities noted. Neurologic:  Alert and  oriented x4;  grossly normal neurologically. Cervical Nodes:  No significant cervical adenopathy. Inguinal Nodes:  No significant inguinal adenopathy. Psych:  Alert and cooperative. Normal mood and affect.  Impression & Recommendations:  Problem # 1:  OTHER DYSPHAGIA (ICD-787.29) Solid food dysphagia. Rule out strictures and less likely neoplasms. The risks, benefits and alternatives to endoscopy with possible biopsy and possible dilation were discussed with the patient and they consent to proceed. The procedure will be scheduled electively. Orders: EGD SAV (EGD SAV)  Problem # 2:  DIVERTICULOSIS-COLON (ICD-562.10) Long-term high fiber diet with adequate fluid intake.  Patient Instructions: 1)  Upper Endoscopy with Dilatation brochure given.  2)  Copy sent to : Neena Rhymes, MD 3)  The medication list was reviewed and reconciled.  All changed / newly prescribed medications were explained.  A complete medication list was provided to the patient / caregiver.

## 2010-04-01 NOTE — Miscellaneous (Signed)
Summary: mammogram    Clinical Lists Changes  Observations: Added new observation of MAMMOGRAM: abnormal left (05/17/2009 13:17)      Preventive Care Screening  Mammogram:    Date:  05/17/2009    Results:  abnormal left     going for additional view 05/22/09

## 2010-04-01 NOTE — Assessment & Plan Note (Signed)
Summary: f/u from procedure--ch.   History of Present Illness Visit Type: Follow-up Visit Primary GI MD: Elie Goody MD [facility] Primary Provider: Neena Rhymes, MD  Requesting Provider: Neena Rhymes, MD  Chief Complaint: follow-up endoscopy for dysphagia History of Present Illness:   Patricia [name] returns today for followup after esophageal dilation to 16 mm. She states for the first week after dilation she had some mild dysphagia but since that time she has had no difficulty swallowing. She has no gastrointestinal complaints.   GI Review of Systems      Denies abdominal pain, acid reflux, belching, bloating, chest pain, dysphagia with liquids, dysphagia with solids, heartburn, loss of appetite, nausea, vomiting, vomiting blood, weight loss, and  weight gain.        Denies anal fissure, black tarry stools, change in bowel habit, constipation, diarrhea, diverticulosis, fecal incontinence, heme positive stool, hemorrhoids, irritable bowel syndrome, jaundice, light color stool, liver problems, rectal bleeding, and  rectal pain.   Current Medications (verified): 1)  Adult Aspirin Low Strength 81 Mg  Tbdp (Aspirin) .Marland Kitchen.. 1 By Mouth Daily 2)  Multivitamin/iron   Tabs (Multiple Vitamins-Iron) .Marland Kitchen.. 1 By Mouth Daily 3)  Diovan 80 Mg Tabs (Valsartan) .Marland Kitchen.. 1 By Mouth Daily 4)  Neurontin 400 Mg Caps (Gabapentin) .... One Twice Daily 5)  Chlor-Trimeton 4 Mg Tabs (Chlorpheniramine Maleate) .... One Every 6 Hours As Needed For Nasal Drainage 6)  Tramadol Hcl 50 Mg  Tabs (Tramadol Hcl) .... One To Two By Mouth Every 4-6 Hours As Needed 7)  Ventolin Hfa 108 (90 Base) Mcg/act Aers (Albuterol Sulfate) .... 2 Puffs Every 4 Hours As Needed With Spacer 8)  Symbicort 80-4.5 Mcg/act Aero (Budesonide-Formoterol Fumarate) .... 2 Puffs Two Times A Day 9)  Lumigan 0.03 % Soln (Bimatoprost) .Marland Kitchen.. 1 Drop Each Eye Every Evening 10)  Istalol 0.5 % Soln (Timolol Maleate) .Marland Kitchen.. 1 Drop Each Eye Every Am 11)   Simvastatin 40 Mg Tabs (Simvastatin) .... Take One Tablet At Bedtime 12)  Omeprazole 20 Mg  Cpdr (Omeprazole) .Marland Kitchen.. 1 Each Day 30 Minutes Before Meal  Allergies (verified): 1)  ! 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 lung cancer resection carcinoid lingula April 2007 breast biopsy  Family History: Reviewed history from 06/10/2009 and no changes required. Family History Stroke-- F and M F--TIA, carotid artery stenosis  Brother and sister have sinus problems otherwise no resp dz or atopy mother w/ HTN No FH of Colon Cancer:  Social History: Reviewed history from 06/10/2009 and no changes required. Retired  Married Alcohol use-yes Drug use-no Regular exercise-yes Patient is a former smoker: Quit 1965   Review of Systems       The patient complains of cough.  The pertinent positives and negatives are noted as above and in the HPI. All other ROS were reviewed and were negative.   Vital Signs:  Patient profile:   70 year old female Height:      58 inches Weight:      136  pounds BMI:     28.53 Pulse rate:   62 / minute Pulse rhythm:   regular BP sitting:   112 / 68  (left arm)  Vitals Entered By: Milford Cage NCMA (September 26, 2009 10:52 AM)  Physical Exam  General:  Well developed, well nourished, no acute distress. Head:  Normocephalic and atraumatic. Eyes:  PERRLA, no icterus. Ears:  Normal auditory acuity. Mouth:  No deformity or lesions, dentition normal. Lungs:  Clear throughout to auscultation. Heart:  Regular rate and rhythm; no murmurs, rubs,  or bruits. Abdomen:  Soft, nontender and nondistended. No masses, hepatosplenomegaly or hernias noted. Normal bowel sounds. Psych:  Alert and cooperative. Normal mood and affect.  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 She has had a very good response to dilation. Continue a daily proton pump inhibitor long term. Return as needed for recurrent dysphagia or other gastrointestinal problems.  Problem # 2:  ESOPHAGEAL STRICTURE (ICD-530.3) See problem #1.  Problem # 3:  SCREENING COLORECTAL-CANCER (ICD-V76.51) Colon cancer screening with colonoscopy January 2019.  Patient Instructions: 1)  Please continue current medications.  2)  Please schedule a follow-up appointment as needed.  3)  Copy sent to : Neena Rhymes, MD 4)  The medication list was reviewed and reconciled.  All changed / newly prescribed medications were explained.  A complete medication list was provided to the patient / caregiver.

## 2010-04-01 NOTE — Progress Notes (Signed)
Summary: simvastatin refill   Phone Note Refill Request Call back at [phone] Message from:  Pharmacy on January 15, 2010 8:16 AM  Refills Requested: Medication #1:  SIMVASTATIN 40 MG TABS take one tablet at bedtime   Dosage confirmed as above?Dosage Confirmed   Supply Requested: 3 months   Last Refilled: 09/03/2009   Notes: 1 refill [facility]  Next Appointment Scheduled: 3.5.12 Initial call taken by: Harold Barban,  January 15, 2010 8:16 AM    Prescriptions: SIMVASTATIN 40 MG TABS (SIMVASTATIN) take one tablet at bedtime  #90 x 1   Entered by:   Doristine Devoid CMA   Authorized by:   Neena Rhymes MD   Signed by:   Doristine Devoid CMA on 01/15/2010   Method used:   Faxed to ...       [facility] [employer] education [employer])       [address] [address]       [place], [place]  [address]       Ph: [phone]       Fax: [phone]   RxID:   [id number]

## 2010-04-01 NOTE — Letter (Signed)
Summary: EGD Instructions  [facility]  [address] [place], [place] [address]   Phone: [phone]  Fax: [phone]       LYNDIE VANDERLOOP    Dec 26, 1940    MRN: [id number]       Procedure Day Dorna Bloom: Wednesday May 4th, 2011     Arrival Time: 2:00pm     Procedure Time: 3:00pm     Location of Procedure:                    _ x _  [facility]oscopy Center (4th Floor)    PREPARATION FOR ENDOSCOPY   On 07/03/09 THE DAY OF THE PROCEDURE:  1.   No solid foods, milk or milk products are allowed after midnight the night before your procedure.  2.   Do not drink anything colored red or purple.  Avoid juices with pulp.  No orange juice.  3.  You may drink clear liquids until 1:00pm, which is 2 hours before your procedure.                                                                                                CLEAR LIQUIDS INCLUDE: Water Jello Ice Popsicles Tea (sugar ok, no milk/cream) Powdered fruit flavored drinks Coffee (sugar ok, no milk/cream) Gatorade Juice: apple, white grape, white cranberry  Lemonade Clear bullion, consomm, broth Carbonated beverages (any kind) Strained chicken noodle soup Hard Candy   MEDICATION INSTRUCTIONS  Unless otherwise instructed, you should take regular prescription medications with a small sip of water as early as possible the morning of your procedure.          OTHER INSTRUCTIONS  You will need a responsible adult at least 70 years of age to accompany you and drive you home.   This person must remain in the waiting room during your procedure.  Wear loose fitting clothing that is easily removed.  Leave jewelry and other valuables at home.  However, you may wish to bring a book to read or an iPod/MP3 player to listen to music as you wait for your procedure to start.  Remove all body piercing jewelry and leave at home.  Total time from sign-in until discharge is approximately 2-3 hours.  You should go home  directly after your procedure and rest.  You can resume normal activities the day after your procedure.  The day of your procedure you should not:   Drive   Make legal decisions   Operate machinery   Drink alcohol   Return to work  You will receive specific instructions about eating, activities and medications before you leave.    The above instructions have been reviewed and explained to me by   Marchelle Folks.     I fully understand and can verbalize these instructions _____________________________ Date _________

## 2010-04-01 NOTE — Assessment & Plan Note (Signed)
Summary: cpx//pt will be fasting//lch   Vital Signs:  Patient profile:   70 year old female Height:      58 inches Weight:      136 pounds BMI:     28.53 Pulse rate:   66 / minute BP sitting:   130 / 80  (left arm)  Vitals Entered By: Doristine Devoid (June 04, 2009 8:29 AM) CC: CPX AND LABS    History of Present Illness: 70 yo woman here today for yearly visit  1) HTN- BP well controlled on Diovan.  no CP, SOB, HAs, edema.  2) Dysphagia- pt notices difficulty swallowing w/ food, especially breads.  feels as if they get stuck.  sxs started 4-6 months ago.  no difficulty w/ liquids.  some regurg.  stopped Omeprazole 2 months ago but not related to sxs.  Has seen GI for colonoscopy ([facility])  3) Health Maintainence- UTD on pap (09), Colonoscopy, Mammogram.  4) Allergic Rhinitis- allergies well controlled on current regimen.  occasional bloody nose w/ Flonase.  chronic cough now controlled.  told by Dr [name] that she no longer needs to see him.  Preventive Screening-Counseling & Management  Alcohol-Tobacco     Alcohol drinks/day: <1     Smoking Status: quit > 6 months  Caffeine-Diet-Exercise     Does Patient Exercise: yes     Type of exercise: Curves     Times/week: 3      Sexual History:  currently monogamous.        Drug Use:  never.    Current Medications (verified): 1)  Flonase 50 Mcg/act Susp (Fluticasone Propionate) .... One Spray Each Nostril Twice Dialy 2)  Adult Aspirin Low Strength 81 Mg  Tbdp (Aspirin) .Marland Kitchen.. 1 By Mouth Daily 3)  Multivitamin/iron   Tabs (Multiple Vitamins-Iron) .Marland Kitchen.. 1 By Mouth Daily 4)  Diovan 80 Mg Tabs (Valsartan) .Marland Kitchen.. 1 By Mouth Daily 5)  Neurontin 400 Mg Caps (Gabapentin) .... One Twice Daily 6)  Chlor-Trimeton 4 Mg Tabs (Chlorpheniramine Maleate) .... One Every 6 Hours As Needed For Nasal Drainage 7)  Tramadol Hcl 50 Mg  Tabs (Tramadol Hcl) .... One To Two By Mouth Every 4-6 Hours As Needed 8)  Ventolin Hfa 108 (90 Base) Mcg/act Aers  (Albuterol Sulfate) .... 2 Puffs Every 4 Hours As Needed With Spacer 9)  Symbicort 80-4.5 Mcg/act Aero (Budesonide-Formoterol Fumarate) .... 2 Puffs Two Times A Day 10)  Lumigan 0.03 % Soln (Bimatoprost) .Marland Kitchen.. 1 Drop Each Eye Every Evening 11)  Istalol 0.5 % Soln (Timolol Maleate) .Marland Kitchen.. 1 Drop Each Eye Every Am  Allergies (verified): 1)  ! Sulfa  Past History:  Past Surgical History: Last updated: 03/21/2007 lung cancer resection carcinoid lingula April 2007 breast biopsy  Family History: Last updated: 06/04/2008 Family History Stroke-- F and M F--TIA, carotid artery stenosis  Brother and sister have sinus problems otherwise no resp dz or atopy mother w/ HTN  Social History: Last updated: 09/09/2007 Former Smoker quit 1965 Married Alcohol use-yes Drug use-no Regular exercise-yes  Review of Systems  The patient denies anorexia, fever, weight loss, weight gain, vision loss, decreased hearing, hoarseness, chest pain, syncope, dyspnea on exertion, peripheral edema, prolonged cough, headaches, abdominal pain, melena, hematochezia, severe indigestion/heartburn, hematuria, suspicious skin lesions, depression, abnormal bleeding, enlarged lymph nodes, and breast masses.    Physical Exam  General:  Well-developed,well-nourished,in no acute distress; alert,appropriate and cooperative throughout examination Head:  Normocephalic and atraumatic without obvious abnormalities. No apparent alopecia or balding. Eyes:  No  corneal or conjunctival inflammation noted. EOMI. Perrla. Funduscopic exam benign, without hemorrhages, exudates or papilledema. Vision grossly normal. Ears:  External ear exam shows no significant lesions or deformities.  Otoscopic examination reveals clear canals, tympanic membranes are intact bilaterally without bulging, retraction, inflammation or discharge. Hearing is grossly normal bilaterally. Nose:  External nasal examination shows no deformity or inflammation. Nasal  mucosa are pink and moist without lesions or exudates. Mouth:  Oral mucosa and oropharynx without lesions or exudates.  Teeth in good repair.  + PND Neck:  No deformities, masses, or tenderness noted. Breasts:  No mass, nodules, thickening, tenderness, bulging, retraction, inflamation, nipple discharge or skin changes noted.   Lungs:  Normal respiratory effort, chest expands symmetrically. Lungs are clear to auscultation, no crackles or wheezes. Heart:  Normal rate and regular rhythm. S1 and S2 normal without gallop, murmur, click, rub or other extra sounds. Abdomen:  Bowel sounds positive,abdomen soft and non-tender without masses, organomegaly or hernias noted. Genitalia:  Pelvic Exam:        External: normal female genitalia without lesions or masses        Vagina: normal without lesions or masses        Cervix: normal to palpation        Adnexa: normal bimanual exam without masses or fullness        Uterus: normal by palpation Msk:  No deformity or scoliosis noted of thoracic or lumbar spine.   Pulses:  +2 carotid, radial, DP Extremities:  No clubbing, cyanosis, edema, or deformity noted with normal full range of motion of all joints.   Neurologic:  No cranial nerve deficits noted. Station and gait are normal. Plantar reflexes are down-going bilaterally. DTRs are symmetrical throughout. Sensory, motor and coordinative functions appear intact. Skin:  small inclusion cyst in center of back Cervical Nodes:  No lymphadenopathy noted Axillary Nodes:  No palpable lymphadenopathy Psych:  Cognition and judgment appear intact. Alert and cooperative with normal attention span and concentration. No apparent delusions, illusions, hallucinations   Impression & Recommendations:  Problem # 1:  HYPERTENSION, BENIGN ESSENTIAL (ICD-401.1) Assessment Unchanged BP adequately controlled.  no need to change meds at this time.  check labs. Her updated medication list for this problem includes:    Diovan 80  Mg Tabs (Valsartan) .Marland Kitchen... 1 by mouth daily  Orders: Venipuncture (13086) TLB-BMP (Basic Metabolic Panel-BMET) (80048-METABOL) TLB-CBC Platelet - w/Differential (85025-CBCD) TLB-TSH (Thyroid Stimulating Hormone) (84443-TSH) TLB-Lipid Panel (80061-LIPID) TLB-Hepatic/Liver Function Pnl (80076-HEPATIC) EKG w/ Interpretation (93000)  Problem # 2:  DYSPHAGIA (ICD-787.20) Assessment: New pt w/ sxs while eating solids.  diff dx includes stricture, hiatal hernia.  sxs started before pt stopped PPI.  will refer to GI for evaluation and tx. Orders: Gastroenterology Referral (GI)  Problem # 3:  ALLERGIC RHINITIS (ICD-477.9) Assessment: Unchanged pt's sxs currently well controlled.  no need to change meds at this time. Her updated medication list for this problem includes:    Flonase 50 Mcg/act Susp (Fluticasone propionate) ..... One spray each nostril twice dialy    Chlor-trimeton 4 Mg Tabs (Chlorpheniramine maleate) ..... One every 6 hours as needed for nasal drainage  Problem # 4:  HEALTHY ADULT FEMALE (ICD-V70.0) Assessment: Unchanged pt UTD on health maintainence.  will be due for pap next year.  PE WNL.  labs collected as above.  Complete Medication List: 1)  Flonase 50 Mcg/act Susp (Fluticasone propionate) .... One spray each nostril twice dialy 2)  Adult Aspirin Low Strength 81 Mg Tbdp (Aspirin) .Marland KitchenMarland KitchenMarland Kitchen  1 by mouth daily 3)  Multivitamin/iron Tabs (Multiple vitamins-iron) .Marland Kitchen.. 1 by mouth daily 4)  Diovan 80 Mg Tabs (Valsartan) .Marland Kitchen.. 1 by mouth daily 5)  Neurontin 400 Mg Caps (Gabapentin) .... One twice daily 6)  Chlor-trimeton 4 Mg Tabs (Chlorpheniramine maleate) .... One every 6 hours as needed for nasal drainage 7)  Tramadol Hcl 50 Mg Tabs (Tramadol hcl) .... One to two by mouth every 4-6 hours as needed 8)  Ventolin Hfa 108 (90 Base) Mcg/act Aers (Albuterol sulfate) .... 2 puffs every 4 hours as needed with spacer 9)  Symbicort 80-4.5 Mcg/act Aero (Budesonide-formoterol fumarate) .... 2  puffs two times a day 10)  Lumigan 0.03 % Soln (Bimatoprost) .Marland Kitchen.. 1 drop each eye every evening 11)  Istalol 0.5 % Soln (Timolol maleate) .Marland Kitchen.. 1 drop each eye every am  Patient Instructions: 1)  Please schedule a follow-up appointment in 6 months to recheck blood pressure. 2)  Keep up the good work!  Your exam looks great! 3)  We'll notify you of your lab results 4)  Someone will call you with your GI appt 5)  Please call with any questions or concerns 6)  Happy Easter!!   Preventive Care Screening  Pap Smear:    Date:  05/06/2007    Results:  normal

## 2010-04-01 NOTE — Progress Notes (Signed)
Summary: neurontin and symbicort refill   Phone Note Refill Request Message from:  Patient on January 28, 2010 11:12 AM  Refills Requested: Medication #1:  NEURONTIN 400 MG CAPS one twice daily   Dosage confirmed as above?Dosage Confirmed   Supply Requested: 3 months   Last Refilled: 11/20/2009  Medication #2:  SYMBICORT 80-4.5 MCG/ACT AERO 2 puffs two times a day   Dosage confirmed as above?Dosage Confirmed   Supply Requested: 3 months [facility]  Next Appointment Scheduled: 3.5.11 Initial call taken by: Harold Barban,  January 28, 2010 11:12 AM    Prescriptions: SYMBICORT 80-4.5 MCG/ACT AERO (BUDESONIDE-FORMOTEROL FUMARATE) 2 puffs two times a day  #3 x 2   Entered by:   Doristine Devoid CMA   Authorized by:   Neena Rhymes MD   Signed by:   Doristine Devoid CMA on 01/28/2010   Method used:   Faxed to ...       [facility] Environmental education officer)       [address] [address]       [place], [place]  [address]       Ph: [phone]       Fax: [phone]   RxID:   [id number] NEURONTIN 400 MG CAPS (GABAPENTIN) one twice daily  #180 x 2   Entered by:   Doristine Devoid CMA   Authorized by:   Neena Rhymes MD   Signed by:   Doristine Devoid CMA on 01/28/2010   Method used:   Faxed to ...       [facility] Environmental education officer)       [address] [address]       [place], [place]  [address]       Ph: [phone]       Fax: [phone]   RxID:   [id number]

## 2010-04-25 ENCOUNTER — Encounter: Payer: Self-pay | Admitting: Family Medicine

## 2010-05-05 ENCOUNTER — Other Ambulatory Visit: Payer: Self-pay | Admitting: Family Medicine

## 2010-05-05 ENCOUNTER — Ambulatory Visit (INDEPENDENT_AMBULATORY_CARE_PROVIDER_SITE_OTHER): Payer: Medicare Other | Admitting: Family Medicine

## 2010-05-05 ENCOUNTER — Encounter: Payer: Self-pay | Admitting: Family Medicine

## 2010-05-05 DIAGNOSIS — M899 Disorder of bone, unspecified: Secondary | ICD-10-CM

## 2010-05-05 DIAGNOSIS — I10 Essential (primary) hypertension: Secondary | ICD-10-CM

## 2010-05-05 DIAGNOSIS — E785 Hyperlipidemia, unspecified: Secondary | ICD-10-CM

## 2010-05-05 LAB — HEPATIC FUNCTION PANEL
ALT: 22 U/L (ref 0–35)
Total Bilirubin: 0.8 mg/dL (ref 0.3–1.2)

## 2010-05-05 LAB — CBC WITH DIFFERENTIAL/PLATELET
Eosinophils Relative: 0.1 % (ref 0.0–5.0)
HCT: 42.5 % (ref 36.0–46.0)
Hemoglobin: 14.9 g/dL (ref 12.0–15.0)
Lymphs Abs: 1.9 10*3/uL (ref 0.7–4.0)
MCV: 93 fl (ref 78.0–100.0)
Monocytes Relative: 6.8 % (ref 3.0–12.0)
Neutro Abs: 2.9 10*3/uL (ref 1.4–7.7)
RDW: 12.5 % (ref 11.5–14.6)

## 2010-05-05 LAB — BASIC METABOLIC PANEL
Chloride: 100 mEq/L (ref 96–112)
GFR: 63.43 mL/min (ref >60.00)
Glucose, Bld: 97 mg/dL (ref 70–99)
Potassium: 4.3 mEq/L (ref 3.5–5.1)
Sodium: 135 mEq/L (ref 135–145)

## 2010-05-05 LAB — TSH: TSH: 0.43 u[IU]/mL (ref 0.35–5.50)

## 2010-05-05 LAB — LIPID PANEL
HDL: 69.2 mg/dL (ref >39.00)
LDL Cholesterol: 89 mg/dL (ref 0–99)
Total CHOL/HDL Ratio: 2
VLDL: 13 mg/dL (ref 0.0–40.0)

## 2010-05-06 LAB — CONVERTED CEMR LAB: Vit D, 25-Hydroxy: 38 ng/mL (ref 30–89)

## 2010-05-13 NOTE — Assessment & Plan Note (Signed)
Summary: 6 mo f/u -[facility]   Vital Signs:  Patient profile:   70 year old female Height:      58 inches (147.32 cm) Weight:      138.38 pounds (62.90 kg) BMI:     29.03 Temp:     97.4 degrees F (36.33 degrees C) oral BP sitting:   130 / 80  (left arm) Cuff size:   regular  Vitals Entered By: Lucious Groves CMA (May 05, 2010 9:19 AM) CC: 6 mo fu./kb Is Patient Diabetic? No Pain Assessment Patient in pain? no      Comments Patient notes that she need renewal of Diovan. Patient denies any symptoms or complaints.   History of Present Illness: 70 yo woman here today for f/u on  1) HTN- BP well controlled.  no CP, SOB, HAs, visual changes, edema.  exercising regularly.  2) Hyperlipidemia- on simvastatin.  no abd pain, N/V, myalgias.    Current Medications (verified): 1)  Adult Aspirin Low Strength 81 Mg  Tbdp (Aspirin) .Marland Kitchen.. 1 By Mouth Daily 2)  Multivitamin/iron   Tabs (Multiple Vitamins-Iron) .Marland Kitchen.. 1 By Mouth Daily 3)  Diovan 80 Mg Tabs (Valsartan) .Marland Kitchen.. 1 By Mouth Daily 4)  Neurontin 400 Mg Caps (Gabapentin) .... One Twice Daily 5)  Chlor-Trimeton 4 Mg Tabs (Chlorpheniramine Maleate) .... One Every 6 Hours As Needed For Nasal Drainage 6)  Tramadol Hcl 50 Mg  Tabs (Tramadol Hcl) .... One To Two By Mouth Every 4-6 Hours As Needed 7)  Ventolin Hfa 108 (90 Base) Mcg/act Aers (Albuterol Sulfate) .... 2 Puffs Every 4 Hours As Needed With Spacer 8)  Symbicort 80-4.5 Mcg/act Aero (Budesonide-Formoterol Fumarate) .... 2 Puffs Two Times A Day 9)  Lumigan 0.03 % Soln (Bimatoprost) .Marland Kitchen.. 1 Drop Each Eye Every Evening 10)  Istalol 0.5 % Soln (Timolol Maleate) .Marland Kitchen.. 1 Drop Each Eye Every Am 11)  Simvastatin 40 Mg Tabs (Simvastatin) .... Take One Tablet At Bedtime 12)  Omeprazole 20 Mg  Cpdr (Omeprazole) .Marland Kitchen.. 1 Each Day 30 Minutes Before Meal  Allergies (verified): 1)  ! Sulfa  Review of Systems      See HPI  Physical Exam  General:  well-nourished,in no acute distress; alert,appropriate  and cooperative throughout examination Head:  Normocephalic and atraumatic without obvious abnormalities. No apparent alopecia or balding. Neck:  No deformities, masses, or tenderness noted. Lungs:  Normal respiratory effort, chest expands symmetrically. Lungs are clear to auscultation, no crackles or wheezes. Heart:  Normal rate and regular rhythm. S1 and S2 normal without gallop, murmur, click, rub or other extra sounds. Abdomen:  Bowel sounds positive,abdomen soft and non-tender without masses, organomegaly or hernias noted. Pulses:  +2 carotid, radial, DP Extremities:  No clubbing, cyanosis, edema, or deformity noted    Impression & Recommendations:  Problem # 1:  HYPERTENSION, BENIGN ESSENTIAL (ICD-401.1) Assessment Unchanged BP adequately controlled.  check labs. Her updated medication list for this problem includes:    Diovan 80 Mg Tabs (Valsartan) .Marland Kitchen... 1 by mouth daily  Orders: TLB-BMP (Basic Metabolic Panel-BMET) (80048-METABOL) TLB-CBC Platelet - w/Differential (85025-CBCD) TLB-TSH (Thyroid Stimulating Hormone) (84443-TSH) Specimen Handling (81191)  Problem # 2:  HYPERLIPIDEMIA (ICD-272.4) Assessment: Unchanged making healthy food choices, exercising regularly, taking Simvastatin.  check labs and adjust meds as needed. Her updated medication list for this problem includes:    Simvastatin 40 Mg Tabs (Simvastatin) .Marland Kitchen... Take one tablet at bedtime  Orders: Venipuncture (47829) TLB-Lipid Panel (80061-LIPID) TLB-Hepatic/Liver Function Pnl (80076-HEPATIC) Specimen Handling (56213)  Complete Medication  List: 1)  Adult Aspirin Low Strength 81 Mg Tbdp (Aspirin) .Marland Kitchen.. 1 by mouth daily 2)  Multivitamin/iron Tabs (Multiple vitamins-iron) .Marland Kitchen.. 1 by mouth daily 3)  Diovan 80 Mg Tabs (Valsartan) .Marland Kitchen.. 1 by mouth daily 4)  Neurontin 400 Mg Caps (Gabapentin) .... One twice daily 5)  Chlor-trimeton 4 Mg Tabs (Chlorpheniramine maleate) .... One every 6 hours as needed for nasal  drainage 6)  Tramadol Hcl 50 Mg Tabs (Tramadol hcl) .... One to two by mouth every 4-6 hours as needed 7)  Ventolin Hfa 108 (90 Base) Mcg/act Aers (Albuterol sulfate) .... 2 puffs every 4 hours as needed with spacer 8)  Symbicort 80-4.5 Mcg/act Aero (Budesonide-formoterol fumarate) .... 2 puffs two times a day 9)  Lumigan 0.03 % Soln (Bimatoprost) .Marland Kitchen.. 1 drop each eye every evening 10)  Istalol 0.5 % Soln (Timolol maleate) .Marland Kitchen.. 1 drop each eye every am 11)  Simvastatin 40 Mg Tabs (Simvastatin) .... Take one tablet at bedtime 12)  Omeprazole 20 Mg Cpdr (Omeprazole) .Marland Kitchen.. 1 each day 30 minutes before meal  Other Orders: T-Vitamin D (25-Hydroxy) (04540-98119)  Patient Instructions: 1)  Schedule your complete physical in late April/May- you can eat before this appt 2)  We'll notify you of your lab results 3)  You look great! 4)  Call with any questions or concerns 5)  Enjoy your spring!!! Prescriptions: DIOVAN 80 MG TABS (VALSARTAN) 1 by mouth daily  #90 x 3   Entered and Authorized by:   Neena Rhymes MD   Signed by:   Neena Rhymes MD on 05/05/2010   Method used:   Faxed to ...       [facility] Environmental education officer)       [address] [address]       [place], [place]  [address]       Ph: [phone]       Fax: [phone]   RxID:   [id number]    Orders Added: 1)  Venipuncture [64403] 2)  TLB-Lipid Panel [80061-LIPID] 3)  TLB-Hepatic/Liver Function Pnl [80076-HEPATIC] 4)  TLB-BMP (Basic Metabolic Panel-BMET) [80048-METABOL] 5)  TLB-CBC Platelet - w/Differential [85025-CBCD] 6)  TLB-TSH (Thyroid Stimulating Hormone) [84443-TSH] 7)  T-Vitamin D (25-Hydroxy) [47425-95638] 8)  Specimen Handling [99000] 9)  Est. Patient Level III [75643]

## 2010-06-06 ENCOUNTER — Encounter: Payer: Self-pay | Admitting: Family Medicine

## 2010-06-09 ENCOUNTER — Ambulatory Visit (INDEPENDENT_AMBULATORY_CARE_PROVIDER_SITE_OTHER): Payer: Medicare Other | Admitting: Family Medicine

## 2010-06-09 ENCOUNTER — Other Ambulatory Visit (HOSPITAL_COMMUNITY)
Admission: RE | Admit: 2010-06-09 | Discharge: 2010-06-09 | Disposition: A | Discharge status: Home / Self Care | Payer: Medicare Other | Source: Ambulatory Visit | Attending: Family Medicine | Admitting: Family Medicine

## 2010-06-09 DIAGNOSIS — Z124 Encounter for screening for malignant neoplasm of cervix: Secondary | ICD-10-CM | POA: Insufficient documentation

## 2010-06-09 DIAGNOSIS — Z85118 Personal history of other malignant neoplasm of bronchus and lung: Secondary | ICD-10-CM

## 2010-06-09 DIAGNOSIS — M949 Disorder of cartilage, unspecified: Secondary | ICD-10-CM

## 2010-06-09 DIAGNOSIS — M899 Disorder of bone, unspecified: Secondary | ICD-10-CM

## 2010-06-09 DIAGNOSIS — Z0000 Encounter for general adult medical examination without abnormal findings: Secondary | ICD-10-CM

## 2010-06-09 DIAGNOSIS — I10 Essential (primary) hypertension: Secondary | ICD-10-CM

## 2010-06-09 DIAGNOSIS — E785 Hyperlipidemia, unspecified: Secondary | ICD-10-CM

## 2010-06-09 NOTE — Assessment & Plan Note (Signed)
Well controlled.  Recent labs look good.  No changes.

## 2010-06-09 NOTE — Assessment & Plan Note (Signed)
Pt w/ excellent control as evidenced by recent labs.  No changes.

## 2010-06-09 NOTE — Assessment & Plan Note (Signed)
Pt's PE WNL.  Recent labs normal.  Will refer for mammo and DEXA.  UTD on remaining health maintenance.

## 2010-06-09 NOTE — Assessment & Plan Note (Signed)
Check CXR- pt 5 yrs out from cancer dx.

## 2010-06-09 NOTE — Assessment & Plan Note (Signed)
Pap collected. 

## 2010-06-09 NOTE — Assessment & Plan Note (Signed)
Due for DEXA.  Recent vitamin D level normal.

## 2010-06-09 NOTE — Progress Notes (Signed)
  Subjective:    Patient ID: Patricia [name], female    DOB: Mar 21, 1940, 70 y.o.   MRN: [id number]  HPI Here today for CPE.  Risk Factors: HTN-  Well controlled on current meds, asymptomatic Hyperlipidemia- Well controlled on current meds, labs checked at last visit. Osteopenia- due for DEXA. Hx of lung cancer- 5 yrs out.  Due for CXR.  Physical Activity: exercising regularly, 3 days/week, plays golf regularly Depression: denies sxs Hearing: normal to whispered voice at 6 ft ADL's: independent Cognitive: normal linear thought process, memory intact for recent and remote, no deficits noted Home Safety: safe at home, lives w/ husband Height, Weight, BMI, Visual Acuity: see vitals, vision corrected to 20/20 w/ glasses Counseling: UTD on colonoscopy.  Due for mammo/bone density.  Pap today.  UTD on flu, pneumovax, zoster.  Reviewed importance of healthy diet, regular exercise. Labs Ordered: Done at last visit. Care Plan: See A&P    Review of Systems Patient reports no vision/hearing changes, adenopathy, fever, weight change,  persistant/recurrent hoarseness , swallowing issues, chest pain, palpitations, edema, persistant /recurrent cough, hemoptysis, dyspnea( rest/ exertional/paroxysmal nocturnal), gastrointestinal bleeding(melena, rectal bleeding), abdominal pain, significant heartburn, bowel changes,GU symptoms(dysuria, hematuria,pyuria, incontinence) ), Gyn symptoms(abnormal  bleeding , pain),  syncope, focal weakness, memory loss, numbness & tingling, skin/hair/nail changes, abnormal bruising or bleeding, anxiety,or depression.      Objective:   Physical Exam BP 126/88  Pulse 74  Ht 4' 10.25" (1.48 m)  Wt 138 lb (62.596 kg)  BMI 28.59 kg/m2  General Appearance:    Alert, cooperative, no distress, appears stated age  Head:    Normocephalic, without obvious abnormality, atraumatic  Eyes:    PERRL, conjunctiva/corneas clear, EOM's intact  Ears:    Normal TM's and external ear  canals, both ears  Nose:   Nares normal, septum midline, mucosa normal, no drainage    or sinus tenderness  Throat:   Lips, mucosa, and tongue normal; teeth and gums normal  Neck:   Supple, symmetrical, trachea midline, no adenopathy;    thyroid:  no enlargement/tenderness/nodules  Back:     Symmetric, ROM normal, no CVA tenderness  Lungs:     Clear to auscultation bilaterally, respirations unlabored  Chest Wall:    No tenderness or deformity   Heart:    Regular rate and rhythm, S1 and S2 normal, no murmur, rub   or gallop  Breast Exam:    No tenderness, masses, or nipple abnormality  Abdomen:     Soft, non-tender, bowel sounds active all four quadrants,    no masses, no organomegaly  Genitalia:    Normal female without lesion, discharge or tenderness, adnexa normal, uterus normal in size, shape, position.  Cervix w/out abnormality.  No external abnormalities.  Rectal:    Small external hemorrhoid  Extremities:   Extremities normal, atraumatic, no cyanosis or edema  Pulses:   2+ and symmetric all extremities  Skin:   Skin color, texture, turgor normal, no rashes or lesions  Lymph nodes:   Cervical, supraclavicular, and axillary nodes normal  Neurologic:   CNII-XII intact, normal strength, sensation and reflexes    throughout          Assessment & Plan:

## 2010-06-09 NOTE — Patient Instructions (Addendum)
Follow up in 6 months- sooner if you need me! Your exam looks great!  Keep up the good work! Go to [address] for your chest xray- any time at your convenience Someone will call you with your mammogram and bone density appt Call with any questions or concerns Happy Spring!

## 2010-06-12 NOTE — Progress Notes (Signed)
Addended byCandie Echevaria on: 06/12/2010 10:00 AM   Modules accepted: Orders

## 2010-07-14 ENCOUNTER — Ambulatory Visit (INDEPENDENT_AMBULATORY_CARE_PROVIDER_SITE_OTHER)
Admission: RE | Admit: 2010-07-14 | Discharge: 2010-07-14 | Disposition: A | Discharge status: Home / Self Care | Payer: Medicare Other | Source: Ambulatory Visit | Attending: Family Medicine | Admitting: Family Medicine

## 2010-07-14 DIAGNOSIS — Z85118 Personal history of other malignant neoplasm of bronchus and lung: Secondary | ICD-10-CM

## 2010-07-15 NOTE — Assessment & Plan Note (Signed)
OFFICE VISIT   HEIRESS, WILLIAMSON  DOB:  01/04/1941                                        Jul 28, 2006  CHART #:  [id number]   She came today for one year followup after having a left lingulectomy  for a carcinoid tumor. A CT scan today showed no evidence of recurrence.  She is doing well overall.   Her blood pressure was 162/80, pulse 71, respirations 18, sats were 99%.   I will see her back again in six months with a chest x-ray.   Ines Bloomer, M.D.  Electronically Signed   DPB/MEDQ  D:  07/28/2006  T:  07/28/2006  Job:  [id number]

## 2010-07-15 NOTE — Assessment & Plan Note (Signed)
OFFICE VISIT   ANGELIQUE, CHEVALIER  DOB:  02-11-1941                                        January 12, 2007  CHART #:  [id number]   She now returns 18 months since her lingulectomy for a carcinoid tumor.  She is doing well.  Her chest x-ray just showed scarring in her left  lingua.  She is doing well overall.  I will see her back again in six  months with a CT scan.  At that time, we will probably discontinue  following her.   Ines Bloomer, M.D.  Electronically Signed   DPB/MEDQ  D:  01/12/2007  T:  01/13/2007  Job:  [id number]

## 2010-07-18 NOTE — H&P (Signed)
NAMETAJE, TONDREAU              ACCOUNT NO.:  [id number]   MEDICAL RECORD NO.:  [id number]           PATIENT TYPE:   LOCATION:                                 FACILITY:   PHYSICIAN:  Ines Bloomer, M.D.      DATE OF BIRTH:   DATE OF ADMISSION:  06/19/2005  DATE OF DISCHARGE:                                HISTORY & PHYSICAL   HISTORY OF PRESENT ILLNESS:  This 70 year old female was having a lot of  trouble with post nasal drip, cough, and sinusitis. Had a chest x-ray that  showed a left lingular lesion. CT scan showed a 2 cm lesion in the left  lingula. A PET scan showed that this was a 2.9 lesion, felt to be a low  grade cancer or possibly a benign lesion. She quit smoking in 07-31-1966. She  smoked for 6 years. She has had no pleuritic pain, weight loss, hemoptysis,  fever, chills, __________ or dyspnea. Pulmonary function tests showed an FVC  of 1.79 or 69% of predicted. An FEV 1 of 1.42 or 67% of predicted.   PAST MEDICAL HISTORY:  Significant for breast biopsy in 07/31/70.   ALLERGIES:  SULFA.   MEDICATIONS:  Include baby aspirin, multivitamin, and __________ twice  daily.   FAMILY HISTORY:  Father died of cardiac disease. Her mother died of a stroke  in 07/30/1997. Her father also had a history of carotid disease. Family history is  negative for cancer.   SOCIAL HISTORY:  She is married and has 2 children. Quit smoking in 07-31-66.  Does not drink alcohol on a regular basis.   REVIEW OF SYSTEMS:  GENERAL:  She is 130 pounds. Her weight had been stable.  She is 5 foot. CARDIOVASCULAR:  She has no angina or atrial fibrillation.  PULMONARY:  See history of present illness. GASTROINTESTINAL:  No nausea,  vomiting, constipation, diarrhea or dysphagia. GENITOURINARY:  No dysuria or  frequent urination. VASCULAR:  No claudication, DVT, or TIA's. NEUROLOGIC:  No headache, blackout, or seizures. ORTHOPEDIC:  No arthritis, joint pain,  or rash. PSYCHIATRIC:  No illnesses.  HEENT:  No change in  her eye sight or  hearing. HEMATOLOGIC:  No problems with bleeding or clotting disorders.   PHYSICAL EXAMINATION:  GENERAL:  She is a well developed female in no acute  distress.  VITAL SIGNS:  Blood pressure 140/80, pulse 90, respiratory rate 18.  Saturations were 98%.  HEENT:  Head atraumatic. Eyes:  Pupils are equal, round, and reactive to  light and accommodation. Extraocular movements are normal. Ears:  Tympanic  membranes intact.  Nose, there  is no septal deviation. Throat is without  lesions.  NECK:  Supple. No thyromegaly.  CHEST:  Clear to auscultation and percussion.  HEART:  Regular sinus rhythm. No murmurs.  ABDOMEN:  Soft. There is no hepatosplenomegaly.  EXTREMITIES:  Pulses are 2+. No clubbing or edema.  NEUROLOGIC:  Intact. Oriented x3. Sensory and motor intact.  SKIN:  Without lesions.   IMPRESSION:  Left upper lobe lesion.   PLAN:  Left lingulectomy.  ______________________________  Ines Bloomer, M.D.     DPB/MEDQ  D:  06/17/2005  T:  06/18/2005  Job:  [id number]

## 2010-07-18 NOTE — Op Note (Signed)
Patricia [name], Patricia [name]              ACCOUNT NO.:  [id number]   MEDICAL RECORD NO.:  [id number]          PATIENT TYPE:  INP   LOCATION:  3304                         FACILITY:  [facility]   PHYSICIAN:  Ines Bloomer, M.D. DATE OF BIRTH:  12-18-1940   DATE OF PROCEDURE:  06/19/2005  DATE OF DISCHARGE:                                 OPERATIVE REPORT   PREOPERATIVE DIAGNOSIS:  Left lingular mass.   POSTOPERATIVE DIAGNOSIS:  Carcinoid tumor, left lingula.   OPERATION PERFORMED:  Left video assisted thoracoscopic surgery, left  lingulectomy.   SURGEON:  Ines Bloomer, M.D.   FIRST ASSISTANT:  Rowe Clack, P.A.-C.   ANESTHESIA:  General.   DESCRIPTION OF PROCEDURE:  After general anesthesia and percutaneous  insertion of all monitoring lines, the patient was turned to the left  lateral thoracotomy position.  A dual lumen tube was inserted.  The left  lung was deflated.  The patient was prepped and draped in the usual sterile  manner.  Two trocar sites were made, one in the anterior axillary line at  the 7th intercostal space, one at the midaxillary line in the posterior  intercostal space.  Two trocars were inserted.  A 30 degree scope was  inserted.  A 0 degree scope was inserted.  Exploration was carried out.  The  lesion was seen in the lingula of the left lower lobe.  An anterior  thoracotomy was made over the fifth intercostal space with the serratus  being split.  The fifth intercostal space was entered and a small retractor  was inserted to keep the tissues away and not spread the ribs.  Through this  and using the scope, a left lingulectomy was performed.  The first stapled  the fissure with two applications of the EZ-45 stapler.  Then we dissected  down in the fissure, found the lingula and the medial basilar artery.  There  was a large 11L node between then and this was removed and then dissecting  inferiorly, a 10L node was dissected out.  The rest of the fissure was  divided with the EZ-45 stapler.  This exposed the lingular branch of the  pulmonary artery which was ligated proximally and distally with 2-0 silk,  clipped and divided.  Then the bronchus was dissected free and it was stable  with the Auto Suture 30 blue stapler.  Finally the lingular branch of the  vein was clipped proximally and distally and divided.  The distal part had  evidence of bleeding, so it was oversewed with 2-0 silk.  Then the rest of  the lingulectomy was performed with EZ-45 stapler, resecting above the mass,  giving at least 2 to 3 cm margin.  After this had been done, it was sent to  frozen section and was found to be a carcinoid. CoSeal was applied to the  staple line.  Two chest tubes were brought into the trocar sites.  An On-Q  catheter was placed subpleurally in the usual fashion.  The chest was closed  with two pericostals #1 Vicryl in the muscle layer and 3-0 Vicryl as  subcuticular stitch.  The patient was then transferred to the recovery room  in stable condition.           ______________________________  Ines Bloomer, M.D.    DPB/MEDQ  D:  06/19/2005  T:  06/20/2005  Job:  [id number]

## 2010-07-18 NOTE — Discharge Summary (Signed)
Patricia [name], Patricia [name]              ACCOUNT NO.:  [id number]   MEDICAL RECORD NO.:  [id number]          PATIENT TYPE:  INP   LOCATION:  3304                         FACILITY:  [facility]   PHYSICIAN:  Rowe Clack, P.A.-C. DATE OF BIRTH:  Jan 01, 1941   DATE OF ADMISSION:  06/19/2005  DATE OF DISCHARGE:  06/23/2005                                 DISCHARGE SUMMARY   HISTORY OF PRESENT ILLNESS:  The patient is a 70 year old female referred to  Dr. Edwyna Shell in thoracic surgical consultation.  The patient was found on  chest x-ray to have a left lingular lesion, and this was confirmed by a CT  scan revealing a 2 cm lesion in the left lingula.  PET scan was subsequently  done and it showed the possibility of a low grade carcinoma versus possible  benign lesion with an SUV of 2.9.  The patient quit smoking in 1968 and  smoked for approximately 6 years.  She has no recent pleuritic pain, weight  loss, hemoptysis, fevers, chills or dyspnea.  She was felt to be a candidate  for surgical resection of this lesion by Dr. Edwyna Shell and admitted this  hospitalization for the procedure.   PAST MEDICAL HISTORY:  Breast biopsy in 1972.   ALLERGIES:  SULFA.   MEDICATIONS PRIOR TO ADMISSION:  1.  Baby aspirin daily.  2.  Multivitamin daily.  3.  Bromphenex b.i.d.   FAMILY/SOCIAL HISTORY/REVIEW OF SYSTEMS/PHYSICAL EXAMINATION:  Please see  the History and Physical done at the time of admission.   HOSPITAL COURSE:  The patient was admitted electively on June 19, 2005, and  was taken to the operating room.  She underwent the following procedure:  Left video assisted thoracoscopic surgery/mini thoracotomy with left  lingulectomy and lymph node sampling.  Patient tolerated the procedure well  and was taken to the post anesthesia care unit in stable condition.  Frozen  section revealed what was felt to be a carcinoid tumor.   POSTOPERATIVE HOSPITAL COURSE:  The patient has done well.  Final pathology  is  currently pending to the chart.  All routine lines, monitors and drainage  devices have been discontinued in the standard fashion.  She has tolerated a  rapid advancement in activity commensurate for level of postoperative  convalescence using routine protocols.  Incision is healing well without  evidence of infection.  Laboratory values are stable.  Most recent  hemoglobin and hematocrit dated June 22, 2005, are 13 and 36.8  respectively.  Electrolytes, BUN and creatinine are all within normal  limits.  The patient has been weaned from oxygen, maintained good  saturations on room air.  The patient has had some difficulties with cough  but is improving.  The patient's overall status is felt to be stable for  discharge in the morning of June 23, 2005, pending morning round  reevaluation.   DISCHARGE MEDICATIONS:  1.  Tylox 1-2 q.6 h p.r.n. pain.  2.  Other medications as previously prior to admission.   FOLLOWUP:  Dr. Edwyna Shell on Tuesday, May 1 at 4 p.m. with chest x-ray from  [facility].   DISCHARGE INSTRUCTIONS:  The patient received written instructions regarding  the medications, activity and diet, wound care and follow up.   FINAL DIAGNOSES:  Left lingular mass, now status post surgical resection as  described.  Frozen section was carcinoid.  Final pathology pending.   OTHER DIAGNOSES:  1.  Remote tobacco use.  2.  Previous breast biopsy.      Rowe Clack, P.A.-C.     Sherryll Burger  D:  06/22/2005  T:  06/23/2005  Job:  [id number]

## 2010-07-18 NOTE — Op Note (Signed)
[facility]  Patient:    Patricia [name], Patricia [name] Visit Number: [id number] MRN: [id number]          Service Type: END Location: ENDO Attending Physician:  Louie Bun Dictated by:   Everardo All Madilyn Fireman, M.D. Proc. Date: 01/19/01 Admit Date:  01/19/2001 Discharge Date: 01/19/2001   CC:         Crista Luria, M.D.   Operative Report  PROCEDURE:  Colonoscopy.  ENDOSCOPIST:  Everardo All. Madilyn Fireman, M.D.  INDICATIONS:  Screening colonoscopy in a 70 year old patient.  DESCRIPTION OF PROCEDURE:  The patient was placed in the left lateral decubitus position and placed on the pulse monitor with continuous low flow oxygen delivered by nasal cannula.  She was sedated with 70 mg of IV Demerol and 8 mg of IV Versed.  The Olympus video colonoscope was inserted into the rectum and advanced to the cecum, confirmed by transillumination of McBurneys point and visualization of the ileocecal valve and appendiceal orifice.  The prep was excellent.  The cecum, ascending, transverse and descending colon all appeared normal with no masses, polyps, diverticula or other mucosal abnormalities.  The rectum likewise appeared normal and retroflexed view of the anus revealed no obvious internal hemorrhoids.  The colonoscope was then withdrawn, and the patient returned to the recovery roomin stable condition. She tolerated the procedure well.  There were no immediate complications.  IMPRESSION:  Normal colonoscopy.  PLAN:  Hemoccults and flexible sigmoidoscopy in five years. Dictated by:   Everardo All Madilyn Fireman, M.D. Attending Physician:  Louie Bun DD:  01/20/23 TD:  01/20/01 Job: [id number]

## 2010-07-29 ENCOUNTER — Encounter: Payer: Self-pay | Admitting: Family Medicine

## 2010-07-31 ENCOUNTER — Encounter: Payer: Self-pay | Admitting: Family Medicine

## 2010-08-29 ENCOUNTER — Other Ambulatory Visit: Payer: Self-pay | Admitting: Family Medicine

## 2010-08-29 NOTE — Telephone Encounter (Signed)
Sent one refill, per [facility] pt is due for CPX. Sent letter.

## 2010-10-23 ENCOUNTER — Other Ambulatory Visit: Payer: Self-pay | Admitting: Family Medicine

## 2010-10-23 NOTE — Telephone Encounter (Signed)
Rx sent 

## 2010-10-23 NOTE — Telephone Encounter (Signed)
Last filled 01-28-10 #180 2, last OV 06-09-10

## 2010-12-02 ENCOUNTER — Other Ambulatory Visit: Payer: Self-pay | Admitting: Family Medicine

## 2010-12-10 ENCOUNTER — Ambulatory Visit: Payer: Medicare Other | Admitting: Family Medicine

## 2010-12-11 ENCOUNTER — Ambulatory Visit: Payer: Medicare Other | Admitting: Family Medicine

## 2010-12-11 ENCOUNTER — Encounter: Payer: Self-pay | Admitting: Family Medicine

## 2010-12-11 ENCOUNTER — Ambulatory Visit (INDEPENDENT_AMBULATORY_CARE_PROVIDER_SITE_OTHER): Payer: Medicare Other | Admitting: Family Medicine

## 2010-12-11 DIAGNOSIS — Z23 Encounter for immunization: Secondary | ICD-10-CM

## 2010-12-11 DIAGNOSIS — E785 Hyperlipidemia, unspecified: Secondary | ICD-10-CM

## 2010-12-11 DIAGNOSIS — I10 Essential (primary) hypertension: Secondary | ICD-10-CM

## 2010-12-11 DIAGNOSIS — J309 Allergic rhinitis, unspecified: Secondary | ICD-10-CM

## 2010-12-11 LAB — BASIC METABOLIC PANEL
Chloride: 102 mEq/L (ref 96–112)
Potassium: 4.1 mEq/L (ref 3.5–5.1)
Sodium: 138 mEq/L (ref 135–145)

## 2010-12-11 LAB — CBC WITH DIFFERENTIAL/PLATELET
Basophils Absolute: 0 10*3/uL (ref 0.0–0.1)
Eosinophils Absolute: 0 10*3/uL (ref 0.0–0.7)
Lymphocytes Relative: 28.9 % (ref 12.0–46.0)
MCHC: 33.9 g/dL (ref 30.0–36.0)
Neutrophils Relative %: 63.3 % (ref 43.0–77.0)
RBC: 4.25 Mil/uL (ref 3.87–5.11)
RDW: 12.8 % (ref 11.5–14.6)

## 2010-12-11 LAB — HEPATIC FUNCTION PANEL
ALT: 19 U/L (ref 0–35)
AST: 21 U/L (ref 0–37)
Bilirubin, Direct: 0 mg/dL (ref 0.0–0.3)
Total Bilirubin: 0.6 mg/dL (ref 0.3–1.2)

## 2010-12-11 LAB — LIPID PANEL
LDL Cholesterol: 72 mg/dL (ref 0–99)
Total CHOL/HDL Ratio: 2

## 2010-12-11 MED ORDER — VALSARTAN 40 MG PO TABS: 40.0000 mg | ORAL_TABLET | Freq: Every day | ORAL | Status: DC

## 2010-12-11 MED ORDER — TRAMADOL HCL 50 MG PO TABS: 50.0000 mg | ORAL_TABLET | Freq: Four times a day (QID) | ORAL | Status: DC | PRN

## 2010-12-11 NOTE — Assessment & Plan Note (Signed)
Pt's sxs appear well controlled on PE.  Continue current meds.

## 2010-12-11 NOTE — Patient Instructions (Signed)
Follow up in 6 weeks to recheck BP We'll notify you of your lab results Start Diovan 40mg - 1/2 tab of what you have at home, 1 of the new tabs Continue the Simvastatin Call with any questions or concerns You look great! Happy Fall!

## 2010-12-11 NOTE — Assessment & Plan Note (Signed)
Pt actually trending towards hypotension.  Decrease diovan to 40mg  daily.  Encouraged increased fluids, changing position slowly.  Will follow BP closely.

## 2010-12-11 NOTE — Progress Notes (Signed)
Quick Note:    Labs mailed.  ______

## 2010-12-11 NOTE — Progress Notes (Signed)
  Subjective:    Patient ID: Patricia [name], female    DOB: Sep 20, 1940, 70 y.o.   MRN: [id number]  HPI HTN- chronic problem for pt.  On Diovan 80mg  daily.  Has been using Glaucoma meds- Combigan and other med.  Reports she has had increased dizziness w/ position change, increased fatigue.  No CP, SOB, HAs, edema  Hyperlipidemia- chronic problem for pt, on Simvastatin daily.  Denies abdominal pain, N/V, myalgias.  Allergic rhinitis- chronic problem for pt, reports cough is 'starting to get bad'.  Hx of similar.  Using Albuterol, chlorpheniramine, Symbicort and OTC antihistamine.  Thought some of dizziness was related to sinus congestion.  Review of Systems For ROS see HPI     Objective:   Physical Exam  Vitals reviewed. Constitutional: She is oriented to person, place, and time. She appears well-developed and well-nourished. No distress.  HENT:  Head: Normocephalic and atraumatic.  Nose: Nose normal.  Mouth/Throat: Oropharynx is clear and moist.       TMs normal bilaterally No TTP over sinuses  Eyes: Conjunctivae and EOM are normal. Pupils are equal, round, and reactive to light.  Neck: Normal range of motion. Neck supple. No thyromegaly present.  Cardiovascular: Normal rate, regular rhythm and intact distal pulses.   Pulmonary/Chest: Effort normal and breath sounds normal. No respiratory distress.  Abdominal: Soft. She exhibits no distension. There is no tenderness.  Musculoskeletal: She exhibits no edema.  Lymphadenopathy:    She has no cervical adenopathy.  Neurological: She is alert and oriented to person, place, and time. No cranial nerve deficit. Coordination normal.  Skin: Skin is warm and dry.  Psychiatric: She has a normal mood and affect. Her behavior is normal.          Assessment & Plan:

## 2010-12-11 NOTE — Assessment & Plan Note (Signed)
Tolerating simvastatin w/out difficulty.  Exercising 3x/week.  Check labs.  Adjust meds prn.

## 2011-01-02 ENCOUNTER — Other Ambulatory Visit: Payer: Self-pay | Admitting: Family Medicine

## 2011-01-02 NOTE — Telephone Encounter (Signed)
rx sent to pharmacy by e-script For zocor,symbicort,prilosec and gabepentin

## 2011-01-26 ENCOUNTER — Ambulatory Visit (INDEPENDENT_AMBULATORY_CARE_PROVIDER_SITE_OTHER): Payer: Medicare Other | Admitting: Family Medicine

## 2011-01-26 ENCOUNTER — Encounter: Payer: Self-pay | Admitting: Family Medicine

## 2011-01-26 VITALS — BP 121/62 | HR 61 | Temp 98.0°F | Ht 58.25 in | Wt 139.6 lb

## 2011-01-26 DIAGNOSIS — I10 Essential (primary) hypertension: Secondary | ICD-10-CM

## 2011-01-26 DIAGNOSIS — Z23 Encounter for immunization: Secondary | ICD-10-CM

## 2011-01-26 NOTE — Progress Notes (Signed)
  Subjective:    Patient ID: Patricia [name], female    DOB: [birth year], 70 y.o.   MRN: [id number]  HPI HTN- we decreased Diovan at last visit b/c of low BP.  Pt was experiencing dizziness w/ standing.  Now 'I feel great!'.  Denies dizziness, CP, SOB, HAs, visual changes.  Health maintenance- had Pneumovax in 2000.  Due for repeat dose.   Review of Systems For ROS see HPI     Objective:   Physical Exam  Vitals reviewed. Constitutional: She is oriented to person, place, and time. She appears well-developed and well-nourished. No distress.  HENT:  Head: Normocephalic and atraumatic.  Eyes: Conjunctivae and EOM are normal. Pupils are equal, round, and reactive to light.  Neck: Normal range of motion. Neck supple. No thyromegaly present.  Cardiovascular: Normal rate, regular rhythm, normal heart sounds and intact distal pulses.   No murmur heard. Pulmonary/Chest: Effort normal and breath sounds normal. No respiratory distress.  Abdominal: Soft. She exhibits no distension. There is no tenderness.  Musculoskeletal: She exhibits no edema.  Lymphadenopathy:    She has no cervical adenopathy.  Neurological: She is alert and oriented to person, place, and time.  Skin: Skin is warm and dry.  Psychiatric: She has a normal mood and affect. Her behavior is normal.          Assessment & Plan:

## 2011-01-26 NOTE — Patient Instructions (Signed)
Follow up in 2 months to recheck BP (if they change your eye drops) Your blood pressure looks great!! Happy Holidays!

## 2011-01-27 NOTE — Assessment & Plan Note (Signed)
Pt feels much better since decreasing meds.  BP remains well controlled.  Asymptomatic.  No changes at this time.  Will continue to follow.

## 2011-02-25 ENCOUNTER — Telehealth: Payer: Self-pay | Admitting: Family Medicine

## 2011-02-25 MED ORDER — ALBUTEROL SULFATE HFA 108 (90 BASE) MCG/ACT IN AERS: 2.0000 | INHALATION_SPRAY | RESPIRATORY_TRACT | Status: DC | PRN

## 2011-02-25 NOTE — Telephone Encounter (Signed)
Patient would like a refill of albuterol be sent to [facility].

## 2011-02-25 NOTE — Telephone Encounter (Signed)
rx sent to pharmacy by e-script  

## 2011-03-06 DIAGNOSIS — H4010X0 Unspecified open-angle glaucoma, stage unspecified: Secondary | ICD-10-CM | POA: Diagnosis not present

## 2011-03-10 ENCOUNTER — Telehealth: Payer: Self-pay | Admitting: Family Medicine

## 2011-03-10 MED ORDER — VALSARTAN 40 MG PO TABS: 40.0000 mg | ORAL_TABLET | Freq: Every day | ORAL | Status: DC

## 2011-03-10 NOTE — Telephone Encounter (Signed)
rx sent to pharmacy by e-script #[id number] to [facility]

## 2011-03-10 NOTE — Telephone Encounter (Signed)
Please send a new rx of diovan 40mg  to [facility] on [address]. Patient states that [facility] but will run out before the meds arrive through the mail.

## 2011-05-05 ENCOUNTER — Telehealth: Payer: Self-pay | Admitting: Family Medicine

## 2011-05-05 NOTE — Telephone Encounter (Signed)
rx refill  gabapentin 400 MG capsule             Dispense Quantity: 180 capsule                Sig: TAKE 1 CAPSULE TWICE DAILY

## 2011-05-06 MED ORDER — GABAPENTIN 400 MG PO CAPS: 400.0000 mg | ORAL_CAPSULE | Freq: Two times a day (BID) | ORAL | Status: DC

## 2011-05-06 NOTE — Telephone Encounter (Signed)
rx sent to pharmacy by e-script  

## 2011-05-07 ENCOUNTER — Ambulatory Visit (INDEPENDENT_AMBULATORY_CARE_PROVIDER_SITE_OTHER): Payer: Medicare Other | Admitting: Family Medicine

## 2011-05-07 ENCOUNTER — Encounter: Payer: Self-pay | Admitting: Family Medicine

## 2011-05-07 VITALS — BP 117/80 | HR 75 | Temp 98.3°F | Ht <= 58 in | Wt 138.8 lb

## 2011-05-07 DIAGNOSIS — J019 Acute sinusitis, unspecified: Secondary | ICD-10-CM | POA: Insufficient documentation

## 2011-05-07 MED ORDER — BENZONATATE 200 MG PO CAPS: 200.0000 mg | ORAL_CAPSULE | Freq: Three times a day (TID) | ORAL | Status: AC | PRN

## 2011-05-07 MED ORDER — AMOXICILLIN 875 MG PO TABS: 875.0000 mg | ORAL_TABLET | Freq: Two times a day (BID) | ORAL | Status: AC

## 2011-05-07 NOTE — Progress Notes (Signed)
  Subjective:    Patient ID: Patricia [name], female    DOB: 1940-10-10, 71 y.o.   MRN: [id number]  HPI URI- sxs started 2 days ago.  Increased fatigue, chills, chest congestion, 'bad cough'- productive.  Some dizziness.  + sinus pain.  No ear pain.  No fever.  + sick contacts.   Review of Systems For ROS see HPI     Objective:   Physical Exam  Vitals reviewed. Constitutional: She appears well-developed and well-nourished. No distress.  HENT:  Head: Normocephalic and atraumatic.  Right Ear: Tympanic membrane normal.  Left Ear: Tympanic membrane normal.  Nose: Mucosal edema and rhinorrhea present. Right sinus exhibits maxillary sinus tenderness and frontal sinus tenderness. Left sinus exhibits maxillary sinus tenderness and frontal sinus tenderness.  Mouth/Throat: Uvula is midline and mucous membranes are normal. Posterior oropharyngeal erythema present. No oropharyngeal exudate.  Eyes: Conjunctivae and EOM are normal. Pupils are equal, round, and reactive to light.  Neck: Normal range of motion. Neck supple.  Cardiovascular: Normal rate, regular rhythm and normal heart sounds.   Pulmonary/Chest: Effort normal and breath sounds normal. No respiratory distress. She has no wheezes.  Lymphadenopathy:    She has no cervical adenopathy.          Assessment & Plan:

## 2011-05-07 NOTE — Assessment & Plan Note (Signed)
New.  Pt's sxs and PE consistent w/ infxn.  Start abx.  Cough meds prn.  Reviewed supportive care and red flags that should prompt return.  Pt expressed understanding and is in agreement w/ plan.  

## 2011-05-07 NOTE — Patient Instructions (Signed)
This is a sinus infection Drink plenty of fluids Start the Amoxicillin twice daily for the sinuses- take w/ food Add Mucinex to thin your chest and nasal congestion Use the Tessalon as needed for cough REST! Hang in there!!!

## 2011-05-25 ENCOUNTER — Other Ambulatory Visit: Payer: Self-pay | Admitting: Family Medicine

## 2011-05-25 NOTE — Telephone Encounter (Signed)
Refill for   1-Gabapentin, 400 MG, Capsule  qty 180 Take 1-capsule twice daily Last fill date 3.6.13  2-Symbicort, 80-4.5 MCG HFA AER AD Qty 2 Inhale 2-puffs twice daily  Last written 11.2.12  3-simvastatin, 40 MG, tablet  Qty 90 Take 1-tablet at bedtime Last written 11.2.12

## 2011-05-25 NOTE — Telephone Encounter (Signed)
Gabapentin 400mg  capsule. Take 1 capsule twice a day. Qty 180. 90 day supply  Simvastatin 40mg  tablet. Take 1 tablet at bedtime. Qty 90.  90 day supply  Symbicort 80-4.5 mcg HFA aer ad. Inhale 2 puffs twice daily. Qty 2. 60 day supply

## 2011-05-26 MED ORDER — GABAPENTIN 400 MG PO CAPS: 400.0000 mg | ORAL_CAPSULE | Freq: Two times a day (BID) | ORAL | Status: DC

## 2011-05-26 MED ORDER — SIMVASTATIN 40 MG PO TABS: 40.0000 mg | ORAL_TABLET | Freq: Every day | ORAL | Status: DC

## 2011-05-26 MED ORDER — BUDESONIDE-FORMOTEROL FUMARATE 80-4.5 MCG/ACT IN AERO: 2.0000 | INHALATION_SPRAY | Freq: Two times a day (BID) | RESPIRATORY_TRACT | Status: DC

## 2011-05-26 NOTE — Telephone Encounter (Signed)
rx sent to pharmacy by e-script  

## 2011-06-04 DIAGNOSIS — H409 Unspecified glaucoma: Secondary | ICD-10-CM | POA: Diagnosis not present

## 2011-06-04 DIAGNOSIS — H4010X0 Unspecified open-angle glaucoma, stage unspecified: Secondary | ICD-10-CM | POA: Diagnosis not present

## 2011-06-11 ENCOUNTER — Ambulatory Visit: Payer: Medicare Other | Admitting: Family Medicine

## 2011-06-11 ENCOUNTER — Encounter: Payer: Self-pay | Admitting: Family Medicine

## 2011-06-11 ENCOUNTER — Ambulatory Visit (INDEPENDENT_AMBULATORY_CARE_PROVIDER_SITE_OTHER): Payer: Medicare Other | Admitting: Family Medicine

## 2011-06-11 VITALS — BP 117/78 | HR 80 | Temp 98.3°F | Ht <= 58 in | Wt 130.6 lb

## 2011-06-11 DIAGNOSIS — E785 Hyperlipidemia, unspecified: Secondary | ICD-10-CM

## 2011-06-11 DIAGNOSIS — H409 Unspecified glaucoma: Secondary | ICD-10-CM | POA: Diagnosis not present

## 2011-06-11 DIAGNOSIS — I10 Essential (primary) hypertension: Secondary | ICD-10-CM

## 2011-06-11 DIAGNOSIS — H4011X0 Primary open-angle glaucoma, stage unspecified: Secondary | ICD-10-CM | POA: Diagnosis not present

## 2011-06-11 MED ORDER — BUDESONIDE-FORMOTEROL FUMARATE 80-4.5 MCG/ACT IN AERO: 2.0000 | INHALATION_SPRAY | Freq: Two times a day (BID) | RESPIRATORY_TRACT | Status: DC

## 2011-06-11 MED ORDER — SIMVASTATIN 40 MG PO TABS: 40.0000 mg | ORAL_TABLET | Freq: Every day | ORAL | Status: DC

## 2011-06-11 NOTE — Patient Instructions (Signed)
Schedule your complete physical for October You look great (minus the eyes!) keep up the good work! We'll notify you of your lab results We sent your refills to [facility] Call with any questions or concerns Happy Spring!

## 2011-06-11 NOTE — Progress Notes (Signed)
  Subjective:    Patient ID: Patricia [name], female    DOB: 1940-08-28, 71 y.o.   MRN: [id number]  HPI HTN- chronic problem, excellent control today.  Denies CP, SOB, HAs, visual changes, edema.  Glaucoma- pt was switched to Combigan eye drops due to increased eye pressure.  And this caused decreased BP.  Was switched back to Lumigan.  Hyperlipidemia- chronic problem, on Simvastatin.  Denies abd pain, N/V, myalgias.     Review of Systems For ROS see HPI     Objective:   Physical Exam  Vitals reviewed. Constitutional: She is oriented to person, place, and time. She appears well-developed and well-nourished. No distress.  HENT:  Head: Normocephalic and atraumatic.  Eyes: EOM are normal. Pupils are equal, round, and reactive to light.       Injected conjunctiva bilaterally  Neck: Normal range of motion. Neck supple. No thyromegaly present.  Cardiovascular: Normal rate, regular rhythm, normal heart sounds and intact distal pulses.   No murmur heard. Pulmonary/Chest: Effort normal and breath sounds normal. No respiratory distress.  Abdominal: Soft. She exhibits no distension. There is no tenderness.  Musculoskeletal: She exhibits no edema.  Lymphadenopathy:    She has no cervical adenopathy.  Neurological: She is alert and oriented to person, place, and time.  Skin: Skin is warm and dry.  Psychiatric: She has a normal mood and affect. Her behavior is normal.          Assessment & Plan:

## 2011-06-12 LAB — BASIC METABOLIC PANEL
Calcium: 8.8 mg/dL (ref 8.4–10.5)
Creatinine, Ser: 0.9 mg/dL (ref 0.4–1.2)
GFR: 63.23 mL/min (ref >60.00)
Glucose, Bld: 97 mg/dL (ref 70–99)
Sodium: 139 mEq/L (ref 135–145)

## 2011-06-12 LAB — LIPID PANEL
HDL: 68.5 mg/dL (ref >39.00)
Total CHOL/HDL Ratio: 2
Triglycerides: 70 mg/dL (ref 0.0–149.0)
VLDL: 14 mg/dL (ref 0.0–40.0)

## 2011-06-12 LAB — HEPATIC FUNCTION PANEL
Albumin: 4 g/dL (ref 3.5–5.2)
Alkaline Phosphatase: 64 U/L (ref 39–117)
Bilirubin, Direct: 0.1 mg/dL (ref 0.0–0.3)

## 2011-06-14 NOTE — Assessment & Plan Note (Signed)
Chronic problem.  Tolerating meds w/out difficulty.  Due for labs.  Adjust meds prn. 

## 2011-06-14 NOTE — Assessment & Plan Note (Signed)
Deteriorated.  Pt currently recovering from allergic rxn to eye drop.  Pt now seeing a specialist and recently had meds changed.  Will follow along.

## 2011-06-14 NOTE — Assessment & Plan Note (Signed)
Chronic problem.  Well controlled.  Asymptomatic.  No changes. 

## 2011-06-15 ENCOUNTER — Encounter: Payer: Self-pay | Admitting: *Deleted

## 2011-07-30 DIAGNOSIS — H409 Unspecified glaucoma: Secondary | ICD-10-CM | POA: Diagnosis not present

## 2011-07-30 DIAGNOSIS — H4011X0 Primary open-angle glaucoma, stage unspecified: Secondary | ICD-10-CM | POA: Diagnosis not present

## 2011-08-10 ENCOUNTER — Other Ambulatory Visit: Payer: Self-pay | Admitting: Family Medicine

## 2011-08-10 NOTE — Telephone Encounter (Signed)
Refill Gabapentin 400 MG Capsule Qty 180 Take one capsule by mouth two times daily  Last wrt. 3.26.13 Last OV 4.11.13

## 2011-08-11 MED ORDER — GABAPENTIN 400 MG PO CAPS: 400.0000 mg | ORAL_CAPSULE | Freq: Two times a day (BID) | ORAL | Status: DC

## 2011-08-11 NOTE — Telephone Encounter (Signed)
rx sent to pharmacy by e-script  

## 2011-09-17 DIAGNOSIS — H4011X0 Primary open-angle glaucoma, stage unspecified: Secondary | ICD-10-CM | POA: Diagnosis not present

## 2011-09-17 DIAGNOSIS — H409 Unspecified glaucoma: Secondary | ICD-10-CM | POA: Diagnosis not present

## 2011-09-19 LAB — HM MAMMOGRAPHY

## 2011-10-08 ENCOUNTER — Ambulatory Visit (INDEPENDENT_AMBULATORY_CARE_PROVIDER_SITE_OTHER): Payer: Medicare Other | Admitting: Family Medicine

## 2011-10-08 ENCOUNTER — Encounter: Payer: Self-pay | Admitting: Family Medicine

## 2011-10-08 VITALS — BP 118/76 | HR 79 | Temp 98.0°F | Ht <= 58 in | Wt 139.2 lb

## 2011-10-08 DIAGNOSIS — J3489 Other specified disorders of nose and nasal sinuses: Secondary | ICD-10-CM | POA: Insufficient documentation

## 2011-10-08 MED ORDER — MUPIROCIN CALCIUM 2 % NA OINT: TOPICAL_OINTMENT | Freq: Two times a day (BID) | NASAL | Status: DC

## 2011-10-08 NOTE — Patient Instructions (Addendum)
This appears to be a skin infection and not a skin cancer Apply the Bactroban twice daily both inside and outside This will likely flatten and scab and then heal.  If not, call me and we'll get you to see Derm Call with any questions or concerns Happy Early Birthday!!!

## 2011-10-08 NOTE — Progress Notes (Signed)
  Subjective:    Patient ID: Patricia [name], female    DOB: 28-Dec-1940, 71 y.o.   MRN: [id number]  HPI Nasal lesion- R lateral nostril.  It's been there 'awhile' but recently 'came up like a boil'.  Had a head and was inflammed.  Can feel corresponding area on inner nostril- this is sore.  External area drained over night.  No hx of similar.   Review of Systems For ROS see HPI     Objective:   Physical Exam  Vitals reviewed. Constitutional: She appears well-developed and well-nourished. No distress.  HENT:  Nose: Sinus tenderness present. No mucosal edema.            Assessment & Plan:

## 2011-10-08 NOTE — Assessment & Plan Note (Signed)
New.  Start topical bactroban both internally and externally.  Pt to call if sxs don't improve.  Reviewed supportive care and red flags that should prompt return.  Pt expressed understanding and is in agreement w/ plan.

## 2011-11-16 DIAGNOSIS — Z1231 Encounter for screening mammogram for malignant neoplasm of breast: Secondary | ICD-10-CM | POA: Diagnosis not present

## 2011-11-20 ENCOUNTER — Encounter: Payer: Self-pay | Admitting: Family Medicine

## 2011-12-03 ENCOUNTER — Other Ambulatory Visit: Payer: Self-pay | Admitting: Family Medicine

## 2011-12-03 MED ORDER — BUDESONIDE-FORMOTEROL FUMARATE 80-4.5 MCG/ACT IN AERO: 2.0000 | INHALATION_SPRAY | Freq: Two times a day (BID) | RESPIRATORY_TRACT | Status: DC

## 2011-12-03 MED ORDER — VALSARTAN 40 MG PO TABS: 40.0000 mg | ORAL_TABLET | Freq: Every day | ORAL | Status: DC

## 2011-12-03 NOTE — Telephone Encounter (Signed)
SYMBICORT INH AEROSOL 80/4.5 QTY: 3 INHALE 2 PUFFS INTO THE LUNGS TWICE DAILY  DIOVAN 40 MG QTY:90 TAKE 1 TABLET BY MOUTH DAILY

## 2011-12-03 NOTE — Telephone Encounter (Signed)
rx sent to pharmacy by e-script  

## 2011-12-08 ENCOUNTER — Other Ambulatory Visit: Payer: Self-pay | Admitting: Family Medicine

## 2011-12-08 MED ORDER — GABAPENTIN 400 MG PO CAPS: 400.0000 mg | ORAL_CAPSULE | Freq: Two times a day (BID) | ORAL | Status: DC

## 2011-12-08 NOTE — Telephone Encounter (Signed)
rx sent to pharmacy by e-script  

## 2011-12-08 NOTE — Telephone Encounter (Signed)
refill Gabapentin (Cap) 400 MG Take 1 capsule (400 mg total) by mouth 2 (two) times daily. #180, last wrt 6.11.13 #180, last ov 8.8.13 acute

## 2011-12-11 ENCOUNTER — Encounter: Payer: Medicare Other | Admitting: Family Medicine

## 2011-12-18 ENCOUNTER — Ambulatory Visit (INDEPENDENT_AMBULATORY_CARE_PROVIDER_SITE_OTHER): Payer: Medicare Other | Admitting: Family Medicine

## 2011-12-18 ENCOUNTER — Encounter: Payer: Self-pay | Admitting: Family Medicine

## 2011-12-18 ENCOUNTER — Encounter (HOSPITAL_BASED_OUTPATIENT_CLINIC_OR_DEPARTMENT_OTHER): Payer: Self-pay

## 2011-12-18 ENCOUNTER — Emergency Department (HOSPITAL_BASED_OUTPATIENT_CLINIC_OR_DEPARTMENT_OTHER)
Admission: EM | Admit: 2011-12-18 | Discharge: 2011-12-18 | Disposition: A | Discharge status: Home / Self Care | Payer: Medicare Other | Attending: Emergency Medicine | Admitting: Emergency Medicine

## 2011-12-18 ENCOUNTER — Emergency Department (HOSPITAL_BASED_OUTPATIENT_CLINIC_OR_DEPARTMENT_OTHER): Payer: Medicare Other

## 2011-12-18 VITALS — BP 115/80 | HR 87 | Temp 98.2°F | Ht 59.0 in | Wt 140.8 lb

## 2011-12-18 DIAGNOSIS — J45991 Cough variant asthma: Secondary | ICD-10-CM | POA: Diagnosis not present

## 2011-12-18 DIAGNOSIS — J302 Other seasonal allergic rhinitis: Secondary | ICD-10-CM

## 2011-12-18 DIAGNOSIS — J309 Allergic rhinitis, unspecified: Secondary | ICD-10-CM | POA: Diagnosis not present

## 2011-12-18 DIAGNOSIS — R0989 Other specified symptoms and signs involving the circulatory and respiratory systems: Secondary | ICD-10-CM

## 2011-12-18 DIAGNOSIS — J45901 Unspecified asthma with (acute) exacerbation: Secondary | ICD-10-CM

## 2011-12-18 DIAGNOSIS — R05 Cough: Secondary | ICD-10-CM

## 2011-12-18 DIAGNOSIS — R0609 Other forms of dyspnea: Secondary | ICD-10-CM

## 2011-12-18 DIAGNOSIS — I10 Essential (primary) hypertension: Secondary | ICD-10-CM | POA: Insufficient documentation

## 2011-12-18 DIAGNOSIS — Z79899 Other long term (current) drug therapy: Secondary | ICD-10-CM | POA: Insufficient documentation

## 2011-12-18 DIAGNOSIS — Z7982 Long term (current) use of aspirin: Secondary | ICD-10-CM | POA: Diagnosis not present

## 2011-12-18 DIAGNOSIS — R0602 Shortness of breath: Secondary | ICD-10-CM | POA: Diagnosis not present

## 2011-12-18 DIAGNOSIS — J385 Laryngeal spasm: Secondary | ICD-10-CM

## 2011-12-18 MED ORDER — LORATADINE 10 MG PO TABS
10.0000 mg | ORAL_TABLET | Freq: Once | ORAL | Status: AC
  Administered 2011-12-18: 10 mg via ORAL
  Filled 2011-12-18: qty 1

## 2011-12-18 MED ORDER — LORATADINE 10 MG PO TABS: 10.0000 mg | ORAL_TABLET | Freq: Every day | ORAL | Status: DC

## 2011-12-18 MED ORDER — ALBUTEROL SULFATE (5 MG/ML) 0.5% IN NEBU
2.5000 mg | INHALATION_SOLUTION | Freq: Once | RESPIRATORY_TRACT | Status: AC
  Administered 2011-12-18: 2.5 mg via RESPIRATORY_TRACT

## 2011-12-18 MED ORDER — ALBUTEROL SULFATE (5 MG/ML) 0.5% IN NEBU
5.0000 mg | INHALATION_SOLUTION | Freq: Once | RESPIRATORY_TRACT | Status: AC
  Administered 2011-12-18: 5 mg via RESPIRATORY_TRACT
  Filled 2011-12-18: qty 1

## 2011-12-18 MED ORDER — IPRATROPIUM BROMIDE 0.02 % IN SOLN
0.5000 mg | Freq: Once | RESPIRATORY_TRACT | Status: AC
  Administered 2011-12-18: 0.5 mg via RESPIRATORY_TRACT

## 2011-12-18 MED ORDER — AEROCHAMBER PLUS FLO-VU LARGE MISC
1.0000 | Freq: Once | Status: DC
  Filled 2011-12-18: qty 1

## 2011-12-18 MED ORDER — PREDNISONE 50 MG PO TABS: 50.0000 mg | ORAL_TABLET | Freq: Every day | ORAL | Status: DC

## 2011-12-18 MED ORDER — METHYLPREDNISOLONE ACETATE 80 MG/ML IJ SUSP
80.0000 mg | Freq: Once | INTRAMUSCULAR | Status: AC
  Administered 2011-12-18: 80 mg via INTRAMUSCULAR

## 2011-12-18 NOTE — ED Notes (Signed)
Pt was given DepoMedrol 80mg  IM at PMD office

## 2011-12-18 NOTE — Progress Notes (Signed)
  Subjective:    Patient ID: Patricia [name], female    DOB: Apr 01, 1940, 71 y.o.   MRN: [id number]  HPI SOB- pt w/ SOB but no wheezing when walking from waiting room to exam room.  Started on duoneb.  Pt started having coughing fit w/ severe respiratory distress and periods of inability to breathe.  Pt was started on 4 L O2 and received 80mg  Depomedrol.  EMS called for emergent transport.  Pt was finally able to stop coughing and report that she has been coughing and short of breath since golfing on Tuesday.  No fevers.  No known sick contacts.  O2 96% on 4 L.  Denies CP.  + diaphoresis   Review of Systems For ROS see HPI     Objective:   Physical Exam  Vitals reviewed. Constitutional: She is oriented to person, place, and time. She appears distressed (obvious respiratory distress).  HENT:  Head: Normocephalic and atraumatic.  Neck: Neck supple.  Cardiovascular: Regular rhythm, normal heart sounds and intact distal pulses.   Pulmonary/Chest:       Increased WOB Decreased BS diffusely No wheezing + paroxysm of cough   Lymphadenopathy:    She has no cervical adenopathy.  Neurological: She is alert and oriented to person, place, and time.  Skin: Skin is warm.       diaphoretic          Assessment & Plan:

## 2011-12-18 NOTE — ED Provider Notes (Addendum)
History     CSN: [id number]  Arrival date & time 12/18/11  1444   First MD Initiated Contact with Patient 12/18/11 1456      Chief Complaint  Patient presents with  . Shortness of Breath  . Cough    (Consider location/radiation/quality/duration/timing/severity/associated sxs/prior treatment) HPI Patricia [name] is a 71 y.o. female the past medical history significant for allergic rhinitis and remote smoking history presents from PCPs office With difficu she says she developed a cough and some allergy symptoms on Tuesday while golfing which worsened on Wednesday. She was seen today by her PCP and had a sensation that her throat was closing while getting a duo neb treatments. Patient is had a history of this before-on coughing fits, like preceded this episode , whi and felt an immediate sense of her throat closing. This was severe, it resolves, but it was constant for a short time. This resolved with time and some oxygen. At one point she had to have EMS called to her house twice in one day secondary to coughing fits.  At that point they gave her a intramuscular injection of steroids and sent her to the ER for further evaluation.   She currently has no sensation that her throat is closing, she is breathing unassisted on room air.  She still has her allergic symptoms and sensation of wheezing. She denies any chest pain. She's had a productive cough with yellow sputum, no hemoptysis.  No fevers, no chills, no lower extremity edema.  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 She's had no symptoms similar to that.  Patient was previously on Flonase for allergic rhinitis however terminated at due to ulcerations in her mucosa. Patient has not tried any Claritin/Allegra/Zyrtec.    Past Medical History  Diagnosis Date  . Cough variant asthma   . Osteopenia   . Stricture and stenosis of cervix   . Postmenopausal   . GERD (gastroesophageal reflux  disease)   . Disease of pharynx or nasopharynx   . Elevated BP   . Neoplasm   . Personal history of malignant neoplasm of bronchus and lung   . Allergic rhinitis, cause unspecified   . Diverticulosis   . Internal hemorrhoids   . Hiatal hernia   . Hypertension     Past Surgical History  Procedure Date  . Lung cancer surgery     resection carcinoid lingula  . Breast biopsy     Family History  Problem Relation Age of Onset  . Stroke Mother   . Hypertension Mother   . Stroke Father   . Transient ischemic attack Father     History  Substance Use Topics  . Smoking status: Former Games developer  . Smokeless tobacco: Not on file  . Alcohol Use: No    OB History    Grav Para Term Preterm Abortions TAB SAB Ect Mult Living                  Review of Systems At least 10pt or greater review of systems completed and are negative except where specified in the HPI.  Allergies  Combigan and Sulfonamide derivatives  Home Medications   Current Outpatient Rx  Name Route Sig Dispense Refill  . ALBUTEROL SULFATE HFA 108 (90 BASE) MCG/ACT IN AERS Inhalation Inhale 2 puffs into the lungs every 4 (four) hours as needed. 1 Inhaler 3  . ASPIRIN 81 MG PO TBDP Oral Take 81 mg by mouth  daily.      Marland Kitchen BIMATOPROST 0.03 % OP SOLN  1 drop at bedtime.      . BUDESONIDE-FORMOTEROL FUMARATE 80-4.5 MCG/ACT IN AERO Inhalation Inhale 2 puffs into the lungs 2 (two) times daily. 20.7 g 3  . CHLORPHENIRAMINE MALEATE 4 MG PO TABS Oral Take 4 mg by mouth. One every 6 hours as needed for nasal drainage     . GABAPENTIN 400 MG PO CAPS Oral Take 1 capsule (400 mg total) by mouth 2 (two) times daily. 180 capsule 0  . MULTIVITAMIN/IRON PO TABS Oral Take by mouth daily.      Marland Kitchen MUPIROCIN CALCIUM 2 % NA OINT Nasal Place into the nose 2 (two) times daily. for five (5) days. After application, press sides of nose together and gently massage.    Marland Kitchen OMEPRAZOLE 20 MG PO CPDR  TAKE 1 CAPSULE EVERY DAY 30 MINUTES BEFORE A MEAL  90 capsule 2  . SIMVASTATIN 40 MG PO TABS Oral Take 1 tablet (40 mg total) by mouth at bedtime. 90 tablet 3  . TIMOLOL MALEATE 0.5 % (DAILY) OP SOLN Ophthalmic Apply to eye. 1 drop each eye every am     . TRAMADOL HCL 50 MG PO TABS Oral Take 1 tablet (50 mg total) by mouth every 6 (six) hours as needed. 1-2 tablets 45 tablet 1  . VALSARTAN 40 MG PO TABS Oral Take 1 tablet (40 mg total) by mouth daily. 90 tablet 1    BP 144/71  Pulse 80  Temp 98.6 F (37 C) (Oral)  Resp 18  Ht 4\' 11"  (1.499 m)  Wt 138 lb (62.596 kg)  BMI 27.87 kg/m2  SpO2 100%  Physical Exam  Nursing notes reviewed.  Electronic medical record reviewed. VITAL SIGNS:   Filed Vitals:   12/18/11 1445 12/18/11 1448 12/18/11 1513 12/18/11 1515  BP: 144/71     Pulse: 80     Temp: 98.6 F (37 C)     TempSrc: Oral     Resp: 18     Height: 4\' 11"  (1.499 m)     Weight: 138 lb (62.596 kg)     SpO2: 95% 98% 97% 100%   CONSTITUTIONAL: Awake, oriented, appears non-toxic HENT: Atraumatic, normocephalic, oral mucosa pink and moist, airway patent. Nares patent without drainage. External ears normal. EYES: Conjunctiva clear, EOMI, PERRLA NECK: Trachea midline, non-tender, supple CARDIOVASCULAR: Normal heart rate, Normal rhythm, No murmurs, rubs, gallops PULMONARY/CHEST:  Expiratory phase prolonged, expiratory wheezes. Slightly diminished bilaterally ABDOMINAL: Non-distended, soft, non-tender - no rebound or guarding.  BS normal. NEUROLOGIC: Non-focal, moving all four extremities, no gross sensory or motor deficits. EXTREMITIES: No clubbing, cyanosis, or edema SKIN: Warm, Dry, No erythema, No rash  ED Course  Procedures (including critical care time)  Date: 12/18/2011  Rate: 74  Rhythm: Sinus arrhythmia  QRS Axis: normal  Intervals: normal  ST/T Wave abnormalities: normal  Conduction Disutrbances: none  Narrative Interpretation: Sinus arrhythmia-no malignant arrhythmia, no ST or T wave abnormalities suggestive of  ischemia or infarction. No real change since last EKG dated 06/17/2005-no rhythm strip for comparison     Labs Reviewed - No data to display Dg Chest 2 View  12/18/2011  *RADIOLOGY REPORT*  Clinical Data: Shortness of breath and cough  CHEST - 2 VIEW  Comparison: 07/14/2010  Findings: Anastomotic chain sutures project over the left mid chest.  Heart size at upper limits of normal. Aorta is ectatic and unfolded.  No focal pulmonary opacity.  No pleural effusion.  Lower thoracic spine kyphosis is stable.  IMPRESSION: No acute cardiopulmonary process.   Original Report Authenticated By: Harrel Lemon, M.D.      1. Asthma exacerbation   2. Seasonal allergies   3. Laryngospasm   4. Cough     MDM  Patricia [name] is a 71 y.o. female presenting with seasonal allergies, asthma exacerbation, and apparent laryngospasm. It sounds like the patient has had laryngospasm in the past triggered by coughing episodes. Sounds like her nebulized treatment today her PCPs office triggered another such episode which resolved.    Patient's chest x-ray is clear-no indication of pneumonia, no indication to treat with antibiotics at this time. I think the best way to control her cough and subsequent laryngospasm is to control her allergic symptoms and inflammatory cascade with anti-histamines and steroids respectively. Patient does have an albuterol inhaler at home however does not have a spacer-will provide spacer to use with her rescue inhaler.  I do not think her shortness of breath is an anginal equivalent-I think this is likely due to her asthma and allergy exacerbation.  I explained the diagnosis and have given explicit precautions to return to the ER including chest pain, worsening shortness of breath, laryngospasm or any other new or worsening symptoms. The patient understands and accepts the medical plan as it's been dictated and I have answered their questions. Discharge instructions concerning home care  and prescriptions have been given.  The patient is STABLE and is discharged to home in good condition.   Jones Skene, MD 12/18/11 1653

## 2011-12-18 NOTE — Assessment & Plan Note (Signed)
Deteriorated.  Pt w/ severe respiratory distress w/ paroxysms of cough.  Diff dx includes PNA, pertussis, asthma.  Due to new O2 requirement and witnessed inability to breathe pt to transport to ER for evaluation.  Pt expressed understanding and is in agreement w/ plan.

## 2011-12-18 NOTE — ED Notes (Signed)
Pt developed a cough on Tuesday, seen by PMD PTA and developed a sensation of her throat closing during nebulizer tx.

## 2011-12-23 ENCOUNTER — Encounter: Payer: Self-pay | Admitting: Family Medicine

## 2011-12-23 ENCOUNTER — Ambulatory Visit (INDEPENDENT_AMBULATORY_CARE_PROVIDER_SITE_OTHER): Payer: Medicare Other | Admitting: Family Medicine

## 2011-12-23 VITALS — BP 126/84 | HR 80 | Temp 98.2°F | Ht <= 58 in | Wt 144.8 lb

## 2011-12-23 DIAGNOSIS — J385 Laryngeal spasm: Secondary | ICD-10-CM

## 2011-12-23 MED ORDER — PREDNISONE 20 MG PO TABS: ORAL_TABLET | ORAL | Status: DC

## 2011-12-23 MED ORDER — TRAMADOL HCL 50 MG PO TABS: 50.0000 mg | ORAL_TABLET | Freq: Four times a day (QID) | ORAL | Status: DC | PRN

## 2011-12-23 NOTE — Progress Notes (Signed)
  Subjective:    Patient ID: Patricia [name], female    DOB: 09-06-1940, 71 y.o.   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  Nearly complete airway obstruction.  Pt reports hx of similar- 7 or 8 of them.  Was given 5 day course of steroids.  Still having SOB.  Normal CXR.  Has not seen pulm recently Patricia [name]).  Will take Ultram prn for cough w/ good relief.  Using Symbicort regularly, has not been using albuterol- 'i don't know when to use it'.  Denies productive cough, fever, wheezing.   Review of Systems For ROS see HPI     Objective:   Physical Exam  Vitals reviewed. Constitutional: She appears well-developed and well-nourished. No distress.  HENT:  Head: Normocephalic and atraumatic.       TMs normal bilaterally Mild nasal congestion Throat w/out erythema, edema, or exudate  Eyes: Conjunctivae normal and EOM are normal. Pupils are equal, round, and reactive to light.  Neck: Normal range of motion. Neck supple.  Cardiovascular: Normal rate, regular rhythm, normal heart sounds and intact distal pulses.   No murmur heard. Pulmonary/Chest: Effort normal and breath sounds normal. No respiratory distress. She has no wheezes.       + dry cough  Lymphadenopathy:    She has no cervical adenopathy.          Assessment & Plan:

## 2011-12-23 NOTE — Patient Instructions (Addendum)
We'll call you with your ENT referral Restart the prednisone- 2 tabs at the same time daily- take w/ food Use the inhaler w/ any shortness of breath Use the tramadol as needed for cough Hang in there!!

## 2011-12-23 NOTE — Assessment & Plan Note (Signed)
New.  Pt had severe episode in office last week, had repeat episode on Sunday.  Will extend steroid course for total of 10 days.  Instructed on Albuterol use.  Refill on Ultram.  Refer to ENT for formal eval.  Reviewed supportive care and red flags that should prompt return.  Pt expressed understanding and is in agreement w/ plan.

## 2011-12-24 DIAGNOSIS — J385 Laryngeal spasm: Secondary | ICD-10-CM | POA: Diagnosis not present

## 2011-12-28 ENCOUNTER — Encounter: Payer: Self-pay | Admitting: Family Medicine

## 2011-12-28 ENCOUNTER — Ambulatory Visit (INDEPENDENT_AMBULATORY_CARE_PROVIDER_SITE_OTHER): Payer: Medicare Other | Admitting: Family Medicine

## 2011-12-28 VITALS — BP 125/80 | HR 90 | Temp 98.3°F | Ht <= 58 in | Wt 145.0 lb

## 2011-12-28 DIAGNOSIS — M949 Disorder of cartilage, unspecified: Secondary | ICD-10-CM | POA: Diagnosis not present

## 2011-12-28 DIAGNOSIS — R6889 Other general symptoms and signs: Secondary | ICD-10-CM

## 2011-12-28 DIAGNOSIS — E785 Hyperlipidemia, unspecified: Secondary | ICD-10-CM

## 2011-12-28 DIAGNOSIS — I10 Essential (primary) hypertension: Secondary | ICD-10-CM | POA: Diagnosis not present

## 2011-12-28 DIAGNOSIS — Z0000 Encounter for general adult medical examination without abnormal findings: Secondary | ICD-10-CM

## 2011-12-28 DIAGNOSIS — M899 Disorder of bone, unspecified: Secondary | ICD-10-CM

## 2011-12-28 DIAGNOSIS — R7989 Other specified abnormal findings of blood chemistry: Secondary | ICD-10-CM

## 2011-12-28 LAB — HEPATIC FUNCTION PANEL
ALT: 20 U/L (ref 0–35)
Total Bilirubin: 0.8 mg/dL (ref 0.3–1.2)
Total Protein: 6.6 g/dL (ref 6.0–8.3)

## 2011-12-28 LAB — BASIC METABOLIC PANEL
BUN: 24 mg/dL — ABNORMAL HIGH (ref 6–23)
CO2: 28 mEq/L (ref 19–32)
Chloride: 103 mEq/L (ref 96–112)
Creatinine, Ser: 0.9 mg/dL (ref 0.4–1.2)

## 2011-12-28 LAB — TSH: TSH: 0.11 u[IU]/mL — ABNORMAL LOW (ref 0.35–5.50)

## 2011-12-28 LAB — CBC WITH DIFFERENTIAL/PLATELET
Basophils Relative: 0.4 % (ref 0.0–3.0)
Eosinophils Absolute: 0 10*3/uL (ref 0.0–0.7)
HCT: 42.8 % (ref 36.0–46.0)
Hemoglobin: 14.4 g/dL (ref 12.0–15.0)
Lymphocytes Relative: 31.1 % (ref 12.0–46.0)
Lymphs Abs: 2.8 10*3/uL (ref 0.7–4.0)
MCHC: 33.5 g/dL (ref 30.0–36.0)
Neutro Abs: 5.3 10*3/uL (ref 1.4–7.7)
RBC: 4.55 Mil/uL (ref 3.87–5.11)

## 2011-12-28 LAB — LIPID PANEL: Cholesterol: 198 mg/dL (ref 0–200)

## 2011-12-28 NOTE — Assessment & Plan Note (Signed)
Chronic problem.  Well controlled on current med.  Asymptomatic.  No changes.  Will follow.

## 2011-12-28 NOTE — Patient Instructions (Addendum)
Follow up in 6 months to recheck your cholesterol and blood pressure We'll notify you of your lab results and make any changes if needed You look great!  Keep up the good work! Call with any questions or concerns Happy Fall!!!

## 2011-12-28 NOTE — Assessment & Plan Note (Signed)
Pt's PE WNL.  UTD on health maintenance.  Check labs.  Anticipatory guidance provided.  

## 2011-12-28 NOTE — Progress Notes (Signed)
  Subjective:    Patient ID: Patricia [name], female    DOB: Jan 18, 1941, 71 y.o.   MRN: [id number]  HPI Here today for CPE.  Risk Factors: HTN- chronic problem, good control on Diovan.  Denies CP, SOB, HAs, visual changes.  Mild edema since starting steriods Hyperlipidemia- chronic problem, tolerating on Zocor.  Denies abd pain, N/V, myalgias Physical Activity: very active Fall Risk: low risk, very steady on feet Depression: no current sxs Hearing: normal to whispered voice and conversational tones at 6 ft ADL's: independent Cognitive: normal linear thought process, memory and attention intact Home Safety: safe at home, lives w/ husband Height, Weight, BMI, Visual Acuity: see vitals, vision corrected to 20/20 w/ glasses Counseling: UTD on pap, mammo, DEXA, colonoscopy Labs Ordered: See A&P Care Plan: See A&P    Review of Systems Patient reports no vision/ hearing changes, adenopathy,fever, weight change,  persistant/recurrent hoarseness , swallowing issues, chest pain, palpitations, edema, persistant/recurrent cough, hemoptysis, gastrointestinal bleeding (melena, rectal bleeding), abdominal pain, significant heartburn, bowel changes, GU symptoms (dysuria, hematuria, incontinence), Gyn symptoms (abnormal  bleeding, pain),  syncope, focal weakness, memory loss, numbness & tingling, skin/hair/nail changes, abnormal bruising or bleeding, anxiety, or depression.     Objective:   Physical Exam General Appearance:    Alert, cooperative, no distress, appears stated age  Head:    Normocephalic, without obvious abnormality, atraumatic  Eyes:    PERRL, conjunctiva/corneas clear, EOM's intact, fundi    benign, both eyes  Ears:    Normal TM's and external ear canals, both ears  Nose:   Nares normal, septum midline, mucosa normal, no drainage    or sinus tenderness  Throat:   Lips, mucosa, and tongue normal; teeth and gums normal  Neck:   Supple, symmetrical, trachea midline, no adenopathy;   Thyroid: no enlargement/tenderness/nodules  Back:     Symmetric, no curvature, ROM normal, no CVA tenderness  Lungs:     Clear to auscultation bilaterally, respirations unlabored  Chest Wall:    No tenderness or deformity   Heart:    Regular rate and rhythm, S1 and S2 normal, no murmur, rub   or gallop  Breast Exam:    Deferred  Abdomen:     Soft, non-tender, bowel sounds active all four quadrants,    no masses, no organomegaly  Genitalia:    Deferred  Rectal:    Extremities:   Extremities normal, atraumatic, no cyanosis or edema  Pulses:   2+ and symmetric all extremities  Skin:   Skin color, texture, turgor normal, no rashes or lesions  Lymph nodes:   Cervical, supraclavicular, and axillary nodes normal  Neurologic:   CNII-XII intact, normal strength, sensation and reflexes    throughout          Assessment & Plan:

## 2011-12-28 NOTE — Assessment & Plan Note (Signed)
Chronic problem.  Tolerating statin w/out difficulty.  Check labs.  Adjust meds prn  

## 2011-12-28 NOTE — Assessment & Plan Note (Signed)
UTD on DEXA.  Check labs.  Replete prn.

## 2011-12-29 LAB — T3, FREE: T3, Free: 2.2 pg/mL — ABNORMAL LOW (ref 2.3–4.2)

## 2011-12-29 LAB — T4, FREE: Free T4: 0.99 ng/dL (ref 0.60–1.60)

## 2011-12-30 ENCOUNTER — Encounter: Payer: Self-pay | Admitting: *Deleted

## 2011-12-30 NOTE — Addendum Note (Signed)
Addended by: Derry Lory A on: 12/30/2011 05:35 PM   Modules accepted: Orders

## 2012-01-03 LAB — VITAMIN D 1,25 DIHYDROXY
Vitamin D2 1, 25 (OH)2: <8 pg/mL
Vitamin D3 1, 25 (OH)2: 59 pg/mL

## 2012-01-05 ENCOUNTER — Ambulatory Visit: Payer: Medicare Other | Admitting: Endocrinology

## 2012-01-08 ENCOUNTER — Encounter: Payer: Self-pay | Admitting: Family Medicine

## 2012-01-12 ENCOUNTER — Ambulatory Visit: Payer: Medicare Other | Admitting: Endocrinology

## 2012-01-20 ENCOUNTER — Encounter: Payer: Self-pay | Admitting: Endocrinology

## 2012-01-20 ENCOUNTER — Ambulatory Visit (INDEPENDENT_AMBULATORY_CARE_PROVIDER_SITE_OTHER): Payer: Medicare Other | Admitting: Endocrinology

## 2012-01-20 VITALS — BP 118/64 | HR 73 | Temp 97.9°F | Resp 16 | Ht <= 58 in | Wt 141.0 lb

## 2012-01-20 DIAGNOSIS — E0590 Thyrotoxicosis, unspecified without thyrotoxic crisis or storm: Secondary | ICD-10-CM

## 2012-01-20 NOTE — Progress Notes (Signed)
Subjective:    Patient ID: Patricia [name], female    DOB: 1940-12-19, 71 y.o.   MRN: [id number]  HPI Pt has a few years of intermittently suppressed TSH.  She says she is unaware of ever having had any other type of thyroid tests or treatment.   She has intermittent hoarseness sensation in the throat, and assoc wheezing. Past Medical History  Diagnosis Date  . Cough variant asthma   . Osteopenia   . Stricture and stenosis of cervix   . Postmenopausal   . GERD (gastroesophageal reflux disease)   . Disease of pharynx or nasopharynx   . Elevated BP   . Neoplasm   . Personal history of malignant neoplasm of bronchus and lung   . Allergic rhinitis, cause unspecified   . Diverticulosis   . Internal hemorrhoids   . Hiatal hernia   . Hypertension   . Laryngospasm   . 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 Hyperlipidemia   . Glaucoma   . Laryngospasm     Past Surgical History  Procedure Date  . Lung cancer surgery     resection carcinoid lingula  . Breast biopsy   . Lung surgery     carcinoid tumor removed    History   Social History  . Marital Status: Married    Spouse Name: N/A    Number of Children: N/A  . Years of Education: N/A   Occupational History  . Not on file.   Social History Main Topics  . Smoking status: Former Games developer  . Smokeless tobacco: Not on file  . Alcohol Use: No  . Drug Use: No  . Sexually Active: Not on file   Other Topics Concern  . Not on file   Social History Narrative  . No narrative on file    Current Outpatient Prescriptions on File Prior to Visit  Medication Sig Dispense Refill  . albuterol (VENTOLIN HFA) 108 (90 BASE) MCG/ACT inhaler Inhale 2 puffs into the lungs every 4 (four) hours as needed.  1 Inhaler  3  . Aspirin (ADULT ASPIRIN LOW STRENGTH) 81 MG EC tablet Take 81 mg by mouth daily.        . bimatoprost (LUMIGAN) 0.03 % ophthalmic drops 1 drop at bedtime.        . budesonide-formoterol (SYMBICORT) 80-4.5  MCG/ACT inhaler Inhale 2 puffs into the lungs 2 (two) times daily.  20.7 g  3  . chlorpheniramine (CHLOR-TRIMETON) 4 MG tablet Take 4 mg by mouth. One every 6 hours as needed for nasal drainage       . loratadine (CLARITIN) 10 MG tablet Take 1 tablet (10 mg total) by mouth daily.  30 tablet  0  . Multiple Vitamins-Iron (MULTIVITAMIN/IRON) TABS Take by mouth daily.        Marland Kitchen omeprazole (PRILOSEC) 20 MG capsule TAKE 1 CAPSULE EVERY DAY 30 MINUTES BEFORE A MEAL  90 capsule  2  . simvastatin (ZOCOR) 40 MG tablet Take 1 tablet (40 mg total) by mouth at bedtime.  90 tablet  3  . Timolol Maleate (ISTALOL) 0.5 % (DAILY) SOLN Apply to eye. 1 drop each eye every am       . traMADol (ULTRAM) 50 MG tablet Take 1 tablet (50 mg total) by mouth every 6 (six) hours as needed. 1-2 tablets  45 tablet  1  . valsartan (DIOVAN) 40 MG tablet Take 1 tablet (40 mg total) by mouth daily.  90 tablet  1  . gabapentin (NEURONTIN) 400 MG capsule Take 1 capsule (400 mg total) by mouth 2 (two) times daily.  180 capsule  0  . mupirocin nasal ointment (BACTROBAN) 2 % Place into the nose 2 (two) times daily. for five (5) days. After application, press sides of nose together and gently massage.      . predniSONE (DELTASONE) 20 MG tablet 2 tabs daily x5 days- take w/ food  10 tablet  0    Allergies  Allergen Reactions  . Combigan (Brimonidine Tartrate-Timolol)     Eyes itch, reddened  . Sulfonamide Derivatives     Family History  Problem Relation Age of Onset  . Stroke Mother   . Hypertension Mother   . Hyperlipidemia Mother   . Stroke Father   . Transient ischemic attack Father   . Hyperlipidemia Father   . Hypertension Father   son has uncertain type of thyroid cancer  BP 118/64  Pulse 73  Temp 97.9 F (36.6 C) (Oral)  Resp 16  Ht 4\' 10"  (1.473 m)  Wt 141 lb (63.957 kg)  BMI 29.47 kg/m2  SpO2 95%   Review of Systems denies headache, double vision, palpitations, diarrhea, polyuria, myalgias, numbness,  tremor, anxiety, menopausal sxs, and easy bruising.  She has weight gain. She has intermittent sob, rhinorrhea, and excessive diaphoresis.      Objective:   Physical Exam VS: see vs page GEN: no distress HEAD: head: no deformity eyes: no periorbital swelling, no proptosis external nose and ears are normal mouth: no lesion seen NECK: 2 cm right thyroid nodule CHEST WALL: no deformity LUNGS:  Clear to auscultation CV: reg rate and rhythm, no murmur ABD: abdomen is soft, nontender.  no hepatosplenomegaly.  not distended.  no hernia MUSCULOSKELETAL: muscle bulk and strength are grossly normal.  no obvious joint swelling.  gait is normal and steady EXTEMITIES: no deformity.  no edema PULSES: no carotid bruit NEURO:  cn 2-12 grossly intact.   readily moves all 4's.  sensation is intact to touch on all 4's. SKIN:  Normal texture and temperature.  No rash or suspicious lesion is visible.   NODES:  None palpable at the neck PSYCH: alert, oriented x3.  Does not appear anxious nor depressed.  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  This causes high risk to her health Hoarseness, very unlikely to be thyroid-related Weight gain, not thyroid-related Excessive diaphoresis.  This could be thyroid-related

## 2012-01-20 NOTE — Patient Instructions (Addendum)
Please let me know what type of thyroid cancer your son has.   Please consider the treatment options we discussed today, and let me know.

## 2012-01-21 DIAGNOSIS — E0590 Thyrotoxicosis, unspecified without thyrotoxic crisis or storm: Secondary | ICD-10-CM | POA: Insufficient documentation

## 2012-01-22 DIAGNOSIS — H52209 Unspecified astigmatism, unspecified eye: Secondary | ICD-10-CM | POA: Diagnosis not present

## 2012-01-22 DIAGNOSIS — H409 Unspecified glaucoma: Secondary | ICD-10-CM | POA: Diagnosis not present

## 2012-01-22 DIAGNOSIS — H4011X0 Primary open-angle glaucoma, stage unspecified: Secondary | ICD-10-CM | POA: Diagnosis not present

## 2012-01-22 DIAGNOSIS — H2510 Age-related nuclear cataract, unspecified eye: Secondary | ICD-10-CM | POA: Diagnosis not present

## 2012-01-25 ENCOUNTER — Telehealth: Payer: Self-pay | Admitting: *Deleted

## 2012-01-25 DIAGNOSIS — E0590 Thyrotoxicosis, unspecified without thyrotoxic crisis or storm: Secondary | ICD-10-CM

## 2012-01-25 NOTE — Telephone Encounter (Signed)
Patient called stated her son had Papillary Thyroid Cancer 6 years ago. Patient is now requesting to pursue having the testing you discussed with her on last visit.

## 2012-01-26 NOTE — Telephone Encounter (Signed)
i ordered

## 2012-01-26 NOTE — Telephone Encounter (Signed)
Pt advised and states an understanding 

## 2012-01-27 ENCOUNTER — Other Ambulatory Visit: Payer: Self-pay | Admitting: *Deleted

## 2012-01-27 MED ORDER — OMEPRAZOLE 20 MG PO CPDR: DELAYED_RELEASE_CAPSULE | ORAL | Status: DC

## 2012-01-27 NOTE — Telephone Encounter (Signed)
Rx sent 

## 2012-02-09 ENCOUNTER — Other Ambulatory Visit: Payer: Self-pay | Admitting: *Deleted

## 2012-02-09 MED ORDER — GABAPENTIN 400 MG PO CAPS: 400.0000 mg | ORAL_CAPSULE | Freq: Two times a day (BID) | ORAL | Status: DC

## 2012-02-09 NOTE — Telephone Encounter (Signed)
Rx refill sent//AB/CMA 

## 2012-02-15 ENCOUNTER — Telehealth: Payer: Self-pay | Admitting: Family Medicine

## 2012-02-15 NOTE — Telephone Encounter (Signed)
Refill: tramadol hcl 50 mg tablet. Take 1 tablet by mouth every 6 hours as needed.

## 2012-02-15 NOTE — Telephone Encounter (Signed)
Ok for #45, 1 refill 

## 2012-02-15 NOTE — Telephone Encounter (Signed)
Last ov 12-28-11, last filled 12-23-11 #45 1

## 2012-02-16 MED ORDER — TRAMADOL HCL 50 MG PO TABS: 50.0000 mg | ORAL_TABLET | Freq: Four times a day (QID) | ORAL | Status: DC | PRN

## 2012-02-16 NOTE — Telephone Encounter (Signed)
Rx sent 

## 2012-03-03 ENCOUNTER — Encounter (HOSPITAL_COMMUNITY)
Admission: RE | Admit: 2012-03-03 | Discharge: 2012-03-03 | Disposition: A | Discharge status: Home / Self Care | Payer: Medicare Other | Source: Ambulatory Visit | Attending: Endocrinology | Admitting: Endocrinology

## 2012-03-03 DIAGNOSIS — E042 Nontoxic multinodular goiter: Secondary | ICD-10-CM | POA: Diagnosis not present

## 2012-03-03 DIAGNOSIS — E0590 Thyrotoxicosis, unspecified without thyrotoxic crisis or storm: Secondary | ICD-10-CM | POA: Insufficient documentation

## 2012-03-04 ENCOUNTER — Encounter (HOSPITAL_COMMUNITY)
Admission: RE | Admit: 2012-03-04 | Discharge: 2012-03-04 | Disposition: A | Discharge status: Home / Self Care | Payer: Medicare Other | Source: Ambulatory Visit | Attending: Endocrinology | Admitting: Endocrinology

## 2012-03-04 ENCOUNTER — Encounter (HOSPITAL_COMMUNITY): Payer: Self-pay

## 2012-03-04 DIAGNOSIS — E0590 Thyrotoxicosis, unspecified without thyrotoxic crisis or storm: Secondary | ICD-10-CM | POA: Diagnosis not present

## 2012-03-04 MED ORDER — SODIUM PERTECHNETATE TC 99M INJECTION
10.2000 | Freq: Once | INTRAVENOUS | Status: AC | PRN
  Administered 2012-03-04: 10.2 via INTRAVENOUS

## 2012-03-04 MED ORDER — SODIUM IODIDE I 131 CAPSULE
11.8000 | Freq: Once | INTRAVENOUS | Status: AC | PRN
  Administered 2012-03-03: 11.8 via ORAL

## 2012-03-07 ENCOUNTER — Telehealth: Payer: Self-pay | Admitting: Endocrinology

## 2012-03-07 ENCOUNTER — Other Ambulatory Visit: Payer: Self-pay | Admitting: Endocrinology

## 2012-03-07 DIAGNOSIS — E0590 Thyrotoxicosis, unspecified without thyrotoxic crisis or storm: Secondary | ICD-10-CM

## 2012-03-07 NOTE — Telephone Encounter (Signed)
The patient states she was called today, 03/07/12, regarding a procedure or test she is supposed to have and she would like more information regarding this phone call.  Please call the patient at [phone].

## 2012-03-07 NOTE — Telephone Encounter (Signed)
This is the radioactive pill, to treat the overactive thyroid.

## 2012-03-08 NOTE — Telephone Encounter (Signed)
Pt advised and states an understanding 

## 2012-03-18 ENCOUNTER — Encounter (HOSPITAL_COMMUNITY): Payer: Self-pay

## 2012-03-18 ENCOUNTER — Encounter (HOSPITAL_COMMUNITY)
Admission: RE | Admit: 2012-03-18 | Discharge: 2012-03-18 | Disposition: A | Discharge status: Home / Self Care | Payer: Medicare Other | Source: Ambulatory Visit | Attending: Endocrinology | Admitting: Endocrinology

## 2012-03-18 DIAGNOSIS — E0520 Thyrotoxicosis with toxic multinodular goiter without thyrotoxic crisis or storm: Secondary | ICD-10-CM | POA: Diagnosis not present

## 2012-03-18 DIAGNOSIS — E0590 Thyrotoxicosis, unspecified without thyrotoxic crisis or storm: Secondary | ICD-10-CM

## 2012-03-18 HISTORY — DX: Nontoxic multinodular goiter: E04.2

## 2012-03-18 MED ORDER — SODIUM IODIDE I 131 CAPSULE
32.1000 | Freq: Once | INTRAVENOUS | Status: AC | PRN
  Administered 2012-03-18: 32.1 via ORAL

## 2012-03-24 DIAGNOSIS — J385 Laryngeal spasm: Secondary | ICD-10-CM | POA: Diagnosis not present

## 2012-04-06 ENCOUNTER — Other Ambulatory Visit: Payer: Self-pay | Admitting: Family Medicine

## 2012-04-06 NOTE — Telephone Encounter (Signed)
refill TraMADol HCl (Tab) 50 MG Take 1 tablet (50 mg total) by mouth every 6 (six) hours as needed. 1-2 tablets #45 wt/1-refill last fill 1.15.14

## 2012-04-07 NOTE — Telephone Encounter (Signed)
Please advise on RF request.  Last OV:12-28-11.//AB/CMA

## 2012-04-07 NOTE — Telephone Encounter (Signed)
Ok for #45 w/ 3 refills

## 2012-04-08 MED ORDER — TRAMADOL HCL 50 MG PO TABS: 50.0000 mg | ORAL_TABLET | Freq: Four times a day (QID) | ORAL | Status: DC | PRN

## 2012-04-08 NOTE — Telephone Encounter (Signed)
Rx sent to the pharmacy by e-script.//AB/CMA 

## 2012-04-22 DIAGNOSIS — H4011X0 Primary open-angle glaucoma, stage unspecified: Secondary | ICD-10-CM | POA: Diagnosis not present

## 2012-04-22 DIAGNOSIS — H409 Unspecified glaucoma: Secondary | ICD-10-CM | POA: Diagnosis not present

## 2012-04-22 DIAGNOSIS — H04129 Dry eye syndrome of unspecified lacrimal gland: Secondary | ICD-10-CM | POA: Diagnosis not present

## 2012-04-25 ENCOUNTER — Ambulatory Visit (INDEPENDENT_AMBULATORY_CARE_PROVIDER_SITE_OTHER): Payer: Medicare Other | Admitting: Endocrinology

## 2012-04-25 ENCOUNTER — Encounter: Payer: Self-pay | Admitting: Endocrinology

## 2012-04-25 VITALS — BP 124/74 | HR 85 | Wt 147.0 lb

## 2012-04-25 DIAGNOSIS — E0590 Thyrotoxicosis, unspecified without thyrotoxic crisis or storm: Secondary | ICD-10-CM | POA: Diagnosis not present

## 2012-04-25 NOTE — Progress Notes (Signed)
Subjective:    Patient ID: Patricia [name], female    DOB: 05-Oct-1940, 72 y.o.   MRN: [id number]  HPI In January, 2014, pt had i-131 rx for hyperthyroidism, due to a multinodular goiter.  pt states she feels well in general, except for fatigue.   Past Medical History  Diagnosis Date  . Cough variant asthma   . Osteopenia   . Stricture and stenosis of cervix   . Postmenopausal   . GERD (gastroesophageal reflux disease)   . Disease of pharynx or nasopharynx   . Elevated BP   . Neoplasm   . Personal history of malignant neoplasm of bronchus and lung   . Allergic rhinitis, cause unspecified   . Diverticulosis   . Internal hemorrhoids   . Hiatal hernia   . Hypertension   . Laryngospasm   . 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 Hyperlipidemia   . Glaucoma   . Laryngospasm   . Multinodular goiter     Past Surgical History  Procedure Laterality Date  . Lung cancer surgery      resection carcinoid lingula  . Breast biopsy    . Lung surgery      carcinoid tumor removed    History   Social History  . Marital Status: Married    Spouse Name: N/A    Number of Children: N/A  . Years of Education: N/A   Occupational History  . Not on file.   Social History Main Topics  . Smoking status: Former Games developer  . Smokeless tobacco: Not on file  . Alcohol Use: No  . Drug Use: No  . Sexually Active: Not on file   Other Topics Concern  . Not on file   Social History Narrative  . No narrative on file    Current Outpatient Prescriptions on File Prior to Visit  Medication Sig Dispense Refill  . albuterol (VENTOLIN HFA) 108 (90 BASE) MCG/ACT inhaler Inhale 2 puffs into the lungs every 4 (four) hours as needed.  1 Inhaler  3  . Aspirin (ADULT ASPIRIN LOW STRENGTH) 81 MG EC tablet Take 81 mg by mouth daily.        . bimatoprost (LUMIGAN) 0.03 % ophthalmic drops 1 drop at bedtime.        . budesonide-formoterol (SYMBICORT) 80-4.5 MCG/ACT inhaler Inhale 2 puffs into  the lungs 2 (two) times daily.  20.7 g  3  . chlorpheniramine (CHLOR-TRIMETON) 4 MG tablet Take 4 mg by mouth. One every 6 hours as needed for nasal drainage       . gabapentin (NEURONTIN) 400 MG capsule Take 1 capsule (400 mg total) by mouth 2 (two) times daily.  180 capsule  2  . loratadine (CLARITIN) 10 MG tablet Take 1 tablet (10 mg total) by mouth daily.  30 tablet  0  . Multiple Vitamins-Iron (MULTIVITAMIN/IRON) TABS Take by mouth daily.        . mupirocin nasal ointment (BACTROBAN) 2 % Place into the nose 2 (two) times daily. for five (5) days. After application, press sides of nose together and gently massage.      Marland Kitchen omeprazole (PRILOSEC) 20 MG capsule TAKE 1 CAPSULE EVERY DAY 30 MINUTES BEFORE A MEAL  90 capsule  1  . predniSONE (DELTASONE) 20 MG tablet 2 tabs daily x5 days- take w/ food  10 tablet  0  . simvastatin (ZOCOR) 40 MG tablet Take 1 tablet (40 mg total) by mouth at bedtime.  90 tablet  3  . Timolol Maleate (ISTALOL) 0.5 % (DAILY) SOLN Apply to eye. 1 drop each eye every am       . traMADol (ULTRAM) 50 MG tablet Take 1 tablet (50 mg total) by mouth every 6 (six) hours as needed. 1-2 tablets  45 tablet  3  . valsartan (DIOVAN) 40 MG tablet Take 1 tablet (40 mg total) by mouth daily.  90 tablet  1   No current facility-administered medications on file prior to visit.    Allergies  Allergen Reactions  . Combigan (Brimonidine Tartrate-Timolol)     Eyes itch, reddened  . Sulfonamide Derivatives     Family History  Problem Relation Age of Onset  . Stroke Mother   . Hypertension Mother   . Hyperlipidemia Mother   . Stroke Father   . Transient ischemic attack Father   . Hyperlipidemia Father   . Hypertension Father     BP 124/74  Pulse 85  Wt 147 lb (66.679 kg)  BMI 30.73 kg/m2  SpO2 96%  Review of Systems She has slight weight gain    Objective:   Physical Exam VITAL SIGNS:  See vs page GENERAL: no distress NECK: 2 cm right thyroid nodule, unchanged      Assessment & Plan:  S/p i-131 rx, ready for recheck of tft Dominant nodule is unchanged

## 2012-04-25 NOTE — Patient Instructions (Addendum)
blood tests are being requested for you today.  We'll contact you with results. Please come back for a follow-up appointment for 1 month.

## 2012-05-05 ENCOUNTER — Other Ambulatory Visit: Payer: Self-pay | Admitting: Family Medicine

## 2012-05-17 DIAGNOSIS — H4011X0 Primary open-angle glaucoma, stage unspecified: Secondary | ICD-10-CM | POA: Diagnosis not present

## 2012-05-17 DIAGNOSIS — H409 Unspecified glaucoma: Secondary | ICD-10-CM | POA: Diagnosis not present

## 2012-05-23 ENCOUNTER — Encounter: Payer: Self-pay | Admitting: Endocrinology

## 2012-05-23 ENCOUNTER — Ambulatory Visit (INDEPENDENT_AMBULATORY_CARE_PROVIDER_SITE_OTHER): Payer: Medicare Other | Admitting: Endocrinology

## 2012-05-23 VITALS — BP 126/80 | HR 77 | Wt 147.0 lb

## 2012-05-23 DIAGNOSIS — E0590 Thyrotoxicosis, unspecified without thyrotoxic crisis or storm: Secondary | ICD-10-CM | POA: Diagnosis not present

## 2012-05-23 NOTE — Patient Instructions (Addendum)
blood tests are being requested for you today.  We'll contact you with results. Please come back for a follow-up appointment for 1 month.

## 2012-05-23 NOTE — Progress Notes (Signed)
Subjective:    Patient ID: Patricia [name], female    DOB: 29-Sep-1940, 72 y.o.   MRN: [id number]  HPI In January, 2014, pt had i-131 rx for hyperthyroidism, due to a multinodular goiter.  pt states she feels well in general, except for fatigue and weight gain. Past Medical History  Diagnosis Date  . Cough variant asthma   . Osteopenia   . Stricture and stenosis of cervix   . Postmenopausal   . GERD (gastroesophageal reflux disease)   . Disease of pharynx or nasopharynx   . Elevated BP   . Neoplasm   . Personal history of malignant neoplasm of bronchus and lung   . Allergic rhinitis, cause unspecified   . Diverticulosis   . Internal hemorrhoids   . Hiatal hernia   . Hypertension   . Laryngospasm   . 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 Hyperlipidemia   . Glaucoma   . Laryngospasm   . Multinodular goiter     Past Surgical History  Procedure Laterality Date  . Lung cancer surgery      resection carcinoid lingula  . Breast biopsy    . Lung surgery      carcinoid tumor removed    History   Social History  . Marital Status: Married    Spouse Name: N/A    Number of Children: N/A  . Years of Education: N/A   Occupational History  . Not on file.   Social History Main Topics  . Smoking status: Former Games developer  . Smokeless tobacco: Not on file  . Alcohol Use: No  . Drug Use: No  . Sexually Active: Not on file   Other Topics Concern  . Not on file   Social History Narrative  . No narrative on file    Current Outpatient Prescriptions on File Prior to Visit  Medication Sig Dispense Refill  . albuterol (VENTOLIN HFA) 108 (90 BASE) MCG/ACT inhaler Inhale 2 puffs into the lungs every 4 (four) hours as needed.  1 Inhaler  3  . Aspirin (ADULT ASPIRIN LOW STRENGTH) 81 MG EC tablet Take 81 mg by mouth daily.        . bimatoprost (LUMIGAN) 0.03 % ophthalmic drops 1 drop at bedtime.        . budesonide-formoterol (SYMBICORT) 80-4.5 MCG/ACT inhaler Inhale 2  puffs into the lungs 2 (two) times daily.  20.7 g  3  . chlorpheniramine (CHLOR-TRIMETON) 4 MG tablet Take 4 mg by mouth. One every 6 hours as needed for nasal drainage       . DIOVAN 40 MG tablet TAKE 1 TABLET BY MOUTH DAILY  90 tablet  3  . gabapentin (NEURONTIN) 400 MG capsule Take 1 capsule (400 mg total) by mouth 2 (two) times daily.  180 capsule  2  . loratadine (CLARITIN) 10 MG tablet Take 1 tablet (10 mg total) by mouth daily.  30 tablet  0  . Multiple Vitamins-Iron (MULTIVITAMIN/IRON) TABS Take by mouth daily.        . mupirocin nasal ointment (BACTROBAN) 2 % Place into the nose 2 (two) times daily. for five (5) days. After application, press sides of nose together and gently massage.      Marland Kitchen omeprazole (PRILOSEC) 20 MG capsule TAKE 1 CAPSULE EVERY DAY 30 MINUTES BEFORE A MEAL  90 capsule  1  . predniSONE (DELTASONE) 20 MG tablet 2 tabs daily x5 days- take w/ food  10 tablet  0  .  simvastatin (ZOCOR) 40 MG tablet TAKE 1 TABLET BY MOUTH AT BEDTIME  90 tablet  3  . Timolol Maleate (ISTALOL) 0.5 % (DAILY) SOLN Apply to eye. 1 drop each eye every am       . traMADol (ULTRAM) 50 MG tablet Take 1 tablet (50 mg total) by mouth every 6 (six) hours as needed. 1-2 tablets  45 tablet  3   No current facility-administered medications on file prior to visit.    Allergies  Allergen Reactions  . Combigan (Brimonidine Tartrate-Timolol)     Eyes itch, reddened  . Sulfonamide Derivatives     Family History  Problem Relation Age of Onset  . Stroke Mother   . Hypertension Mother   . Hyperlipidemia Mother   . Stroke Father   . Transient ischemic attack Father   . Hyperlipidemia Father   . Hypertension Father     BP 126/80  Pulse 77  Wt 147 lb (66.679 kg)  BMI 30.73 kg/m2  SpO2 97%  Review of Systems Denies neck pain    Objective:   Physical Exam VITAL SIGNS:  See vs page GENERAL: no distress NECK: 2 cm right thyroid nodule, unchanged.  Lab Results  Component Value Date   TSH  1.68 05/23/2012      Assessment & Plan:  Hyperthyroidism, resolved with i-131 rx

## 2012-06-24 ENCOUNTER — Encounter: Payer: Self-pay | Admitting: Lab

## 2012-06-27 ENCOUNTER — Ambulatory Visit (INDEPENDENT_AMBULATORY_CARE_PROVIDER_SITE_OTHER): Payer: Medicare Other | Admitting: Family Medicine

## 2012-06-27 ENCOUNTER — Encounter: Payer: Self-pay | Admitting: Family Medicine

## 2012-06-27 ENCOUNTER — Ambulatory Visit (INDEPENDENT_AMBULATORY_CARE_PROVIDER_SITE_OTHER): Payer: Medicare Other | Admitting: Endocrinology

## 2012-06-27 ENCOUNTER — Encounter: Payer: Self-pay | Admitting: Endocrinology

## 2012-06-27 VITALS — BP 126/72 | HR 75 | Wt 143.0 lb

## 2012-06-27 VITALS — BP 128/80 | HR 66 | Temp 97.8°F | Ht 58.25 in | Wt 143.8 lb

## 2012-06-27 DIAGNOSIS — E785 Hyperlipidemia, unspecified: Secondary | ICD-10-CM

## 2012-06-27 DIAGNOSIS — R7309 Other abnormal glucose: Secondary | ICD-10-CM

## 2012-06-27 DIAGNOSIS — E0590 Thyrotoxicosis, unspecified without thyrotoxic crisis or storm: Secondary | ICD-10-CM

## 2012-06-27 DIAGNOSIS — L989 Disorder of the skin and subcutaneous tissue, unspecified: Secondary | ICD-10-CM | POA: Diagnosis not present

## 2012-06-27 DIAGNOSIS — I10 Essential (primary) hypertension: Secondary | ICD-10-CM | POA: Diagnosis not present

## 2012-06-27 LAB — T4, FREE: Free T4: 0.86 ng/dL (ref 0.60–1.60)

## 2012-06-27 LAB — HEPATIC FUNCTION PANEL
ALT: 19 U/L (ref 0–35)
AST: 20 U/L (ref 0–37)
Albumin: 3.9 g/dL (ref 3.5–5.2)
Total Bilirubin: 0.7 mg/dL (ref 0.3–1.2)

## 2012-06-27 LAB — LIPID PANEL
Cholesterol: 151 mg/dL (ref 0–200)
LDL Cholesterol: 79 mg/dL (ref 0–99)

## 2012-06-27 LAB — BASIC METABOLIC PANEL
BUN: 26 mg/dL — ABNORMAL HIGH (ref 6–23)
CO2: 26 mEq/L (ref 19–32)
Chloride: 107 mEq/L (ref 96–112)
Glucose, Bld: 116 mg/dL — ABNORMAL HIGH (ref 70–99)
Potassium: 4.3 mEq/L (ref 3.5–5.1)

## 2012-06-27 NOTE — Progress Notes (Signed)
Subjective:    Patient ID: Patricia [name], female    DOB: January 18, 1941, 72 y.o.   MRN: [id number]  HPI In January, 2014, pt had i-131 rx for hyperthyroidism, due to a multinodular goiter.  pt states she feels well in general, except for fatigue and weight gain.  Past Medical History  Diagnosis Date  . Cough variant asthma   . Osteopenia   . Stricture and stenosis of cervix   . Postmenopausal   . GERD (gastroesophageal reflux disease)   . Disease of pharynx or nasopharynx   . Elevated BP   . Neoplasm   . Personal history of malignant neoplasm of bronchus and lung   . Allergic rhinitis, cause unspecified   . Diverticulosis   . Internal hemorrhoids   . Hiatal hernia   . Hypertension   . Laryngospasm   . 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 Hyperlipidemia   . Glaucoma   . Laryngospasm   . Multinodular goiter     Past Surgical History  Procedure Laterality Date  . Lung cancer surgery      resection carcinoid lingula  . Breast biopsy    . Lung surgery      carcinoid tumor removed    History   Social History  . Marital Status: Married    Spouse Name: N/A    Number of Children: N/A  . Years of Education: N/A   Occupational History  . Not on file.   Social History Main Topics  . Smoking status: Former Games developer  . Smokeless tobacco: Not on file  . Alcohol Use: No  . Drug Use: No  . Sexually Active: Not on file   Other Topics Concern  . Not on file   Social History Narrative  . No narrative on file    Current Outpatient Prescriptions on File Prior to Visit  Medication Sig Dispense Refill  . albuterol (VENTOLIN HFA) 108 (90 BASE) MCG/ACT inhaler Inhale 2 puffs into the lungs every 4 (four) hours as needed.  1 Inhaler  3  . Aspirin (ADULT ASPIRIN LOW STRENGTH) 81 MG EC tablet Take 81 mg by mouth daily.        . bimatoprost (LUMIGAN) 0.03 % ophthalmic drops 1 drop at bedtime.        . budesonide-formoterol (SYMBICORT) 80-4.5 MCG/ACT inhaler Inhale  2 puffs into the lungs 2 (two) times daily.  20.7 g  3  . chlorpheniramine (CHLOR-TRIMETON) 4 MG tablet Take 4 mg by mouth. One every 6 hours as needed for nasal drainage       . DIOVAN 40 MG tablet TAKE 1 TABLET BY MOUTH DAILY  90 tablet  3  . gabapentin (NEURONTIN) 400 MG capsule Take 1 capsule (400 mg total) by mouth 2 (two) times daily.  180 capsule  2  . loratadine (CLARITIN) 10 MG tablet Take 1 tablet (10 mg total) by mouth daily.  30 tablet  0  . Multiple Vitamins-Iron (MULTIVITAMIN/IRON) TABS Take by mouth daily.        . mupirocin nasal ointment (BACTROBAN) 2 % Place into the nose 2 (two) times daily. for five (5) days. After application, press sides of nose together and gently massage.      Marland Kitchen omeprazole (PRILOSEC) 20 MG capsule TAKE 1 CAPSULE EVERY DAY 30 MINUTES BEFORE A MEAL  90 capsule  1  . predniSONE (DELTASONE) 20 MG tablet 2 tabs daily x5 days- take w/ food  10 tablet  0  . simvastatin (ZOCOR) 40 MG tablet TAKE 1 TABLET BY MOUTH AT BEDTIME  90 tablet  3  . Timolol Maleate (ISTALOL) 0.5 % (DAILY) SOLN Apply to eye. 1 drop each eye every am       . traMADol (ULTRAM) 50 MG tablet Take 1 tablet (50 mg total) by mouth every 6 (six) hours as needed. 1-2 tablets  45 tablet  3   No current facility-administered medications on file prior to visit.    Allergies  Allergen Reactions  . Combigan (Brimonidine Tartrate-Timolol)     Eyes itch, reddened  . Sulfonamide Derivatives     Family History  Problem Relation Age of Onset  . Stroke Mother   . Hypertension Mother   . Hyperlipidemia Mother   . Stroke Father   . Transient ischemic attack Father   . Hyperlipidemia Father   . Hypertension Father     BP 126/72  Pulse 75  Wt 143 lb (64.864 kg)  BMI 29.61 kg/m2  SpO2 98%  Review of Systems She does not notice the goiter.    Objective:   Physical Exam VITAL SIGNS:  See vs page GENERAL: no distress NECK: 2 cm right thyroid nodule, unchanged  Lab Results  Component Value  Date   TSH 0.66 06/27/2012      Assessment & Plan:  Hyperthyroidism, resolved with i-131 rx Multinodular goiter, clinically unchanged after i-131 rx

## 2012-06-27 NOTE — Patient Instructions (Addendum)
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

## 2012-06-27 NOTE — Assessment & Plan Note (Signed)
Chronic problem.  Tolerating med w/out difficulty.  Check labs.  Adjust meds prn  

## 2012-06-27 NOTE — Progress Notes (Signed)
  Subjective:    Patient ID: Patricia [name], female    DOB: 29-May-1940, 72 y.o.   MRN: [id number]  HPI HTN- chronic problem, well controlled today.  On Diovan daily.  No CP, SOB, HAs, visual changes, edema.  + weight gain since thyroid ablation.  Hyperlipidemia- chronic problem, on Zocor nightly.  No abd pain, N/V, myalgias.  Spot on face- first noticed 4-6 weeks ago.  Initially thought it was a black head but has not resolved.  No pain, no drainage, not crusting.  Pt is concerned for possible skin cancer   Review of Systems For ROS see HPI     Objective:   Physical Exam  Vitals reviewed. Constitutional: She is oriented to person, place, and time. She appears well-developed and well-nourished. No distress.  HENT:  Head: Normocephalic and atraumatic.  Eyes: Conjunctivae and EOM are normal. Pupils are equal, round, and reactive to light.  Neck: Normal range of motion. Neck supple. No thyromegaly present.  Cardiovascular: Normal rate, regular rhythm, normal heart sounds and intact distal pulses.   No murmur heard. Pulmonary/Chest: Effort normal and breath sounds normal. No respiratory distress.  Abdominal: Soft. She exhibits no distension. There is no tenderness.  Musculoskeletal: She exhibits no edema.  Lymphadenopathy:    She has no cervical adenopathy.  Neurological: She is alert and oriented to person, place, and time.  Skin: Skin is warm and dry.  ~1 cm hyperpigmented area on R cheek under eye.  Color drains w/ pressure- appears consistent w/ venous lake  Psychiatric: She has a normal mood and affect. Her behavior is normal.          Assessment & Plan:

## 2012-06-27 NOTE — Assessment & Plan Note (Signed)
New.  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  Refer to derm for complete evaluation.  Pt expressed understanding and is in agreement w/ plan.

## 2012-06-27 NOTE — Assessment & Plan Note (Signed)
Chronic problem.  Adequate control.  Asymptomatic.  Check labs.  No anticipated changes. 

## 2012-06-27 NOTE — Patient Instructions (Addendum)
blood tests are being requested for you today.  We'll contact you with results. Please come back for a follow-up appointment in 4-6 weeks. 

## 2012-06-29 DIAGNOSIS — Z79899 Other long term (current) drug therapy: Secondary | ICD-10-CM | POA: Diagnosis not present

## 2012-06-30 DIAGNOSIS — H409 Unspecified glaucoma: Secondary | ICD-10-CM | POA: Diagnosis not present

## 2012-06-30 DIAGNOSIS — H4011X0 Primary open-angle glaucoma, stage unspecified: Secondary | ICD-10-CM | POA: Diagnosis not present

## 2012-07-08 NOTE — Addendum Note (Signed)
Addended by: Jackson Latino on: 07/08/2012 09:02 AM   Modules accepted: Orders

## 2012-07-13 DIAGNOSIS — D485 Neoplasm of uncertain behavior of skin: Secondary | ICD-10-CM | POA: Diagnosis not present

## 2012-07-13 DIAGNOSIS — D1801 Hemangioma of skin and subcutaneous tissue: Secondary | ICD-10-CM | POA: Diagnosis not present

## 2012-08-01 ENCOUNTER — Encounter: Payer: Self-pay | Admitting: Endocrinology

## 2012-08-01 ENCOUNTER — Ambulatory Visit (INDEPENDENT_AMBULATORY_CARE_PROVIDER_SITE_OTHER): Payer: Medicare Other | Admitting: Endocrinology

## 2012-08-01 VITALS — BP 126/78 | HR 75 | Ht 59.0 in | Wt 141.0 lb

## 2012-08-01 DIAGNOSIS — R7309 Other abnormal glucose: Secondary | ICD-10-CM | POA: Diagnosis not present

## 2012-08-01 DIAGNOSIS — E0590 Thyrotoxicosis, unspecified without thyrotoxic crisis or storm: Secondary | ICD-10-CM | POA: Diagnosis not present

## 2012-08-01 LAB — T4, FREE: Free T4: 0.82 ng/dL (ref 0.60–1.60)

## 2012-08-01 LAB — TSH: TSH: 0.89 u[IU]/mL (ref 0.35–5.50)

## 2012-08-01 NOTE — Progress Notes (Signed)
Subjective:    Patient ID: Patricia [name], female    DOB: [birth year], 72 y.o.   MRN: [id number]  HPI In January, 2014, pt had i-131 rx for hyperthyroidism, due to a multinodular goiter.  pt states she feels well in general, except for weight gain. Past Medical History  Diagnosis Date  . Cough variant asthma   . Osteopenia   . Stricture and stenosis of cervix   . Postmenopausal   . GERD (gastroesophageal reflux disease)   . Disease of pharynx or nasopharynx   . Elevated BP   . Neoplasm   . Personal history of malignant neoplasm of bronchus and lung   . Allergic rhinitis, cause unspecified   . Diverticulosis   . Internal hemorrhoids   . Hiatal hernia   . Hypertension   . Laryngospasm   . 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 Hyperlipidemia   . Glaucoma   . Laryngospasm   . Multinodular goiter     Past Surgical History  Procedure Laterality Date  . Lung cancer surgery      resection carcinoid lingula  . Breast biopsy    . Lung surgery      carcinoid tumor removed    History   Social History  . Marital Status: Married    Spouse Name: N/A    Number of Children: N/A  . Years of Education: N/A   Occupational History  . Not on file.   Social History Main Topics  . Smoking status: Former Games developer  . Smokeless tobacco: Not on file  . Alcohol Use: No  . Drug Use: No  . Sexually Active: Not on file   Other Topics Concern  . Not on file   Social History Narrative  . No narrative on file    Current Outpatient Prescriptions on File Prior to Visit  Medication Sig Dispense Refill  . albuterol (VENTOLIN HFA) 108 (90 BASE) MCG/ACT inhaler Inhale 2 puffs into the lungs every 4 (four) hours as needed.  1 Inhaler  3  . Aspirin (ADULT ASPIRIN LOW STRENGTH) 81 MG EC tablet Take 81 mg by mouth daily.        . bimatoprost (LUMIGAN) 0.03 % ophthalmic drops 1 drop at bedtime.        . budesonide-formoterol (SYMBICORT) 80-4.5 MCG/ACT inhaler Inhale 2 puffs into  the lungs 2 (two) times daily.  20.7 g  3  . chlorpheniramine (CHLOR-TRIMETON) 4 MG tablet Take 4 mg by mouth. One every 6 hours as needed for nasal drainage       . DIOVAN 40 MG tablet TAKE 1 TABLET BY MOUTH DAILY  90 tablet  3  . gabapentin (NEURONTIN) 400 MG capsule Take 1 capsule (400 mg total) by mouth 2 (two) times daily.  180 capsule  2  . loratadine (CLARITIN) 10 MG tablet Take 1 tablet (10 mg total) by mouth daily.  30 tablet  0  . Multiple Vitamins-Iron (MULTIVITAMIN/IRON) TABS Take by mouth daily.        . mupirocin nasal ointment (BACTROBAN) 2 % Place into the nose 2 (two) times daily. for five (5) days. After application, press sides of nose together and gently massage.      Marland Kitchen omeprazole (PRILOSEC) 20 MG capsule TAKE 1 CAPSULE EVERY DAY 30 MINUTES BEFORE A MEAL  90 capsule  1  . predniSONE (DELTASONE) 20 MG tablet 2 tabs daily x5 days- take w/ food  10 tablet  0  .  simvastatin (ZOCOR) 40 MG tablet TAKE 1 TABLET BY MOUTH AT BEDTIME  90 tablet  3  . Timolol Maleate (ISTALOL) 0.5 % (DAILY) SOLN Apply to eye. 1 drop each eye every am       . traMADol (ULTRAM) 50 MG tablet Take 1 tablet (50 mg total) by mouth every 6 (six) hours as needed. 1-2 tablets  45 tablet  3   No current facility-administered medications on file prior to visit.    Allergies  Allergen Reactions  . Combigan (Brimonidine Tartrate-Timolol)     Eyes itch, reddened  . Sulfonamide Derivatives     Family History  Problem Relation Age of Onset  . Stroke Mother   . Hypertension Mother   . Hyperlipidemia Mother   . Stroke Father   . Transient ischemic attack Father   . Hyperlipidemia Father   . Hypertension Father     BP 126/78  Pulse 75  Ht 4\' 11"  (1.499 m)  Wt 141 lb (63.957 kg)  BMI 28.46 kg/m2  SpO2 98%    Review of Systems Denies neck pain.    Objective:   Physical Exam VITAL SIGNS:  See vs page. GENERAL: no distress. NECK: 2 cm right thyroid nodule, unchanged.        Assessment & Plan:   Goiter, unchanged Hyperthyroidism, resolved with i-131 rx

## 2012-08-01 NOTE — Patient Instructions (Signed)
Thyroid blood tests are being requested for you today.  We'll contact you with results. Please come back for a follow-up appointment in 2 months. 

## 2012-08-05 ENCOUNTER — Other Ambulatory Visit: Payer: Self-pay | Admitting: Family Medicine

## 2012-08-05 MED ORDER — FLUTICASONE-SALMETEROL 230-21 MCG/ACT IN AERO: 2.0000 | INHALATION_SPRAY | Freq: Two times a day (BID) | RESPIRATORY_TRACT | Status: DC

## 2012-08-05 NOTE — Telephone Encounter (Signed)
Pt called stating that she received a letter from [facility] symbicort no longer covered without a PA. However, pt can be switched to Advair. Please advise.

## 2012-08-05 NOTE — Telephone Encounter (Signed)
Med filled.  

## 2012-08-05 NOTE — Telephone Encounter (Signed)
meds filled. Pt notified.

## 2012-08-05 NOTE — Telephone Encounter (Signed)
Ok to switch to Advair MDI 250/50- 2 puffs twice daily, #3, 3 refills

## 2012-08-09 ENCOUNTER — Other Ambulatory Visit: Payer: Self-pay | Admitting: Family Medicine

## 2012-08-10 NOTE — Telephone Encounter (Signed)
Rx sent to the pharmacy by e-script.//AB/CMA 

## 2012-10-05 ENCOUNTER — Other Ambulatory Visit: Payer: Self-pay | Admitting: Family Medicine

## 2012-10-06 NOTE — Telephone Encounter (Signed)
Last refill:08-12-12 Last OV:06-27-12-sch'ed for CPE (10-30-140 UDS:06-27-12-Low Risk Please advise.//AB/CMA

## 2012-10-06 NOTE — Telephone Encounter (Signed)
OK #45

## 2012-10-10 ENCOUNTER — Telehealth: Payer: Self-pay | Admitting: *Deleted

## 2012-10-10 ENCOUNTER — Ambulatory Visit: Payer: Medicare Other | Admitting: Endocrinology

## 2012-10-10 ENCOUNTER — Other Ambulatory Visit: Payer: Medicare Other

## 2012-10-10 MED ORDER — TRAMADOL HCL 50 MG PO TABS: 50.0000 mg | ORAL_TABLET | Freq: Four times a day (QID) | ORAL | Status: DC | PRN

## 2012-10-10 NOTE — Telephone Encounter (Signed)
Ok for #45, 3 refills 

## 2012-10-10 NOTE — Telephone Encounter (Signed)
Pharmacy is requesting a refill for Tramadol 50 mg.  Last ov 06/27/2012 last date filled #45 R-3 No UDS on file patient has a controlled substance contract on file.  Please Advise   Ag CMA

## 2012-10-10 NOTE — Telephone Encounter (Signed)
Rx printed and faxed to the pharmacy.//AB/CMA 

## 2012-10-11 NOTE — Telephone Encounter (Signed)
Rx was refilled on 10-10-12.//AB/CMA

## 2012-10-12 ENCOUNTER — Ambulatory Visit (INDEPENDENT_AMBULATORY_CARE_PROVIDER_SITE_OTHER): Payer: Medicare Other | Admitting: Endocrinology

## 2012-10-12 ENCOUNTER — Other Ambulatory Visit: Payer: Self-pay | Admitting: Family Medicine

## 2012-10-12 ENCOUNTER — Encounter: Payer: Self-pay | Admitting: Endocrinology

## 2012-10-12 VITALS — BP 126/80 | HR 76 | Ht 61.0 in | Wt 141.0 lb

## 2012-10-12 DIAGNOSIS — D485 Neoplasm of uncertain behavior of skin: Secondary | ICD-10-CM | POA: Diagnosis not present

## 2012-10-12 DIAGNOSIS — E0590 Thyrotoxicosis, unspecified without thyrotoxic crisis or storm: Secondary | ICD-10-CM

## 2012-10-12 DIAGNOSIS — D1801 Hemangioma of skin and subcutaneous tissue: Secondary | ICD-10-CM | POA: Diagnosis not present

## 2012-10-12 NOTE — Telephone Encounter (Signed)
Rx sent to the pharmacy on 10-10-12.//AB/CMA

## 2012-10-12 NOTE — Patient Instructions (Addendum)
A thyroid blood test is being requested for you today.  We'll contact you with results. Please come back for a follow-up appointment in 3 months.   most of the time, a "lumpy thyroid" will eventually become overactive again.  this may be a slow process, happening over the span of many years.

## 2012-10-12 NOTE — Progress Notes (Signed)
Subjective:    Patient ID: Patricia [name], female    DOB: January 07, 1941, 72 y.o.   MRN: [id number]  HPI In January, 2014, pt had i-131 rx for hyperthyroidism, due to a multinodular goiter.  pt states she feels well in general.   Past Medical History  Diagnosis Date  . Cough variant asthma   . Osteopenia   . Stricture and stenosis of cervix   . Postmenopausal   . GERD (gastroesophageal reflux disease)   . Disease of pharynx or nasopharynx   . Elevated BP   . Neoplasm   . Personal history of malignant neoplasm of bronchus and lung   . Allergic rhinitis, cause unspecified   . Diverticulosis   . Internal hemorrhoids   . Hiatal hernia   . Hypertension   . Laryngospasm   . 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 Hyperlipidemia   . Glaucoma   . Laryngospasm   . Multinodular goiter     Past Surgical History  Procedure Laterality Date  . Lung cancer surgery      resection carcinoid lingula  . Breast biopsy    . Lung surgery      carcinoid tumor removed    History   Social History  . Marital Status: Married    Spouse Name: N/A    Number of Children: N/A  . Years of Education: N/A   Occupational History  . Not on file.   Social History Main Topics  . Smoking status: Former Games developer  . Smokeless tobacco: Not on file  . Alcohol Use: No  . Drug Use: No  . Sexual Activity: Not on file   Other Topics Concern  . Not on file   Social History Narrative  . No narrative on file    Current Outpatient Prescriptions on File Prior to Visit  Medication Sig Dispense Refill  . albuterol (VENTOLIN HFA) 108 (90 BASE) MCG/ACT inhaler Inhale 2 puffs into the lungs every 4 (four) hours as needed.  1 Inhaler  3  . Aspirin (ADULT ASPIRIN LOW STRENGTH) 81 MG EC tablet Take 81 mg by mouth daily.        . bimatoprost (LUMIGAN) 0.03 % ophthalmic drops 1 drop at bedtime.        . chlorpheniramine (CHLOR-TRIMETON) 4 MG tablet Take 4 mg by mouth. One every 6 hours as needed for  nasal drainage       . DIOVAN 40 MG tablet TAKE 1 TABLET BY MOUTH DAILY  90 tablet  3  . fluticasone-salmeterol (ADVAIR HFA) 230-21 MCG/ACT inhaler Inhale 2 puffs into the lungs 2 (two) times daily.  3 Inhaler  3  . gabapentin (NEURONTIN) 400 MG capsule Take 1 capsule (400 mg total) by mouth 2 (two) times daily.  180 capsule  2  . loratadine (CLARITIN) 10 MG tablet Take 1 tablet (10 mg total) by mouth daily.  30 tablet  0  . Multiple Vitamins-Iron (MULTIVITAMIN/IRON) TABS Take by mouth daily.        . mupirocin nasal ointment (BACTROBAN) 2 % Place into the nose 2 (two) times daily. for five (5) days. After application, press sides of nose together and gently massage.      Marland Kitchen omeprazole (PRILOSEC) 20 MG capsule TAKE 1 CAPSULE DAILY 30 MINUTES BEFORE A MEAL  90 capsule  0  . predniSONE (DELTASONE) 20 MG tablet 2 tabs daily x5 days- take w/ food  10 tablet  0  . simvastatin (ZOCOR)  40 MG tablet TAKE 1 TABLET BY MOUTH AT BEDTIME  90 tablet  3  . SYMBICORT 80-4.5 MCG/ACT inhaler INHALE 2 PUFFS INTO THE LUNGS TWICE A DAY  6.9 g  5  . Timolol Maleate (ISTALOL) 0.5 % (DAILY) SOLN Apply to eye. 1 drop each eye every am       . traMADol (ULTRAM) 50 MG tablet Take 1 tablet (50 mg total) by mouth every 6 (six) hours as needed. 1-2 tablets  45 tablet  3   No current facility-administered medications on file prior to visit.    Allergies  Allergen Reactions  . Combigan [Brimonidine Tartrate-Timolol]     Eyes itch, reddened  . Sulfonamide Derivatives     Family History  Problem Relation Age of Onset  . Stroke Mother   . Hypertension Mother   . Hyperlipidemia Mother   . Stroke Father   . Transient ischemic attack Father   . Hyperlipidemia Father   . Hypertension Father    BP 126/80  Pulse 76  Ht 5\' 1"  (1.549 m)  Wt 141 lb (63.957 kg)  BMI 26.66 kg/m2  SpO2 98%  Review of Systems denies neck pain.      Objective:   Physical Exam VITAL SIGNS:  See vs page GENERAL: no distress NECK: 2 cm  right thyroid nodule, unchanged.    Lab Results  Component Value Date   TSH 2.45 10/12/2012      Assessment & Plan:  Multinodular goiter, clinically unchanged Hyperthyroidism, resolved with i-131 rx

## 2012-10-13 ENCOUNTER — Other Ambulatory Visit (INDEPENDENT_AMBULATORY_CARE_PROVIDER_SITE_OTHER): Payer: Medicare Other

## 2012-10-13 DIAGNOSIS — R7309 Other abnormal glucose: Secondary | ICD-10-CM

## 2012-10-13 LAB — BASIC METABOLIC PANEL
BUN: 25 mg/dL — ABNORMAL HIGH (ref 6–23)
Calcium: 9.1 mg/dL (ref 8.4–10.5)
GFR: 70.84 mL/min (ref >60.00)
Glucose, Bld: 99 mg/dL (ref 70–99)
Sodium: 137 mEq/L (ref 135–145)

## 2012-10-16 ENCOUNTER — Other Ambulatory Visit: Payer: Self-pay | Admitting: Family Medicine

## 2012-10-17 NOTE — Telephone Encounter (Signed)
Med filled.  

## 2012-10-19 ENCOUNTER — Ambulatory Visit: Payer: Medicare Other | Admitting: Family Medicine

## 2012-10-28 DIAGNOSIS — H4011X0 Primary open-angle glaucoma, stage unspecified: Secondary | ICD-10-CM | POA: Diagnosis not present

## 2012-10-28 DIAGNOSIS — H409 Unspecified glaucoma: Secondary | ICD-10-CM | POA: Diagnosis not present

## 2012-11-30 ENCOUNTER — Other Ambulatory Visit: Payer: Self-pay | Admitting: Family Medicine

## 2012-11-30 NOTE — Telephone Encounter (Signed)
Last OV 06-27-12 Med filled 10-10-12 #45 with 3 refills

## 2012-11-30 NOTE — Telephone Encounter (Signed)
Med filled.  

## 2012-12-27 ENCOUNTER — Telehealth: Payer: Self-pay

## 2012-12-27 NOTE — Telephone Encounter (Signed)
Left message for call back  identifiable     

## 2012-12-28 ENCOUNTER — Other Ambulatory Visit: Payer: Self-pay | Admitting: Family Medicine

## 2012-12-28 NOTE — Telephone Encounter (Signed)
Medication and allergies: reviewed and updated  90 day supply/mail order: [facility] Local pharmacy: [facility] [address] [place]   Immunizations due: Tdap (discussed with patient)   A/P:   Thyroid nodules--followed by Dr Pricilla Larsson appt 01/11/2013 Needs Tdap this visit Pneumonia vaccine---1/07 and 01/2011 Zostavax vaccine---05/2006 Flu vaccine---10/2012 (HM updated) CCS--07/2007--Dr Stark--next 2019 Dexa---07/2010--next 2014 MMG--11/2011--next 2014  To Discuss with Provider: Will be going to [place] in Feb 2015 Questions about any immunizations she will need

## 2012-12-28 NOTE — Telephone Encounter (Signed)
Med filled.  

## 2012-12-29 ENCOUNTER — Encounter: Payer: Self-pay | Admitting: Family Medicine

## 2012-12-29 ENCOUNTER — Ambulatory Visit (INDEPENDENT_AMBULATORY_CARE_PROVIDER_SITE_OTHER): Payer: Medicare Other | Admitting: Family Medicine

## 2012-12-29 VITALS — BP 124/74 | HR 72 | Temp 98.1°F | Resp 16 | Ht 58.5 in | Wt 144.0 lb

## 2012-12-29 DIAGNOSIS — Z23 Encounter for immunization: Secondary | ICD-10-CM

## 2012-12-29 DIAGNOSIS — Z79899 Other long term (current) drug therapy: Secondary | ICD-10-CM | POA: Diagnosis not present

## 2012-12-29 DIAGNOSIS — Z0184 Encounter for antibody response examination: Secondary | ICD-10-CM

## 2012-12-29 DIAGNOSIS — I10 Essential (primary) hypertension: Secondary | ICD-10-CM

## 2012-12-29 DIAGNOSIS — Z0000 Encounter for general adult medical examination without abnormal findings: Secondary | ICD-10-CM | POA: Diagnosis not present

## 2012-12-29 DIAGNOSIS — E785 Hyperlipidemia, unspecified: Secondary | ICD-10-CM

## 2012-12-29 LAB — CBC WITH DIFFERENTIAL/PLATELET
Basophils Absolute: 0 10*3/uL (ref 0.0–0.1)
Eosinophils Relative: 0 % (ref 0.0–5.0)
Hemoglobin: 14.5 g/dL (ref 12.0–15.0)
Lymphocytes Relative: 26.5 % (ref 12.0–46.0)
Monocytes Relative: 8.2 % (ref 3.0–12.0)
Platelets: 275 10*3/uL (ref 150.0–400.0)
RDW: 13 % (ref 11.5–14.6)
WBC: 4.4 10*3/uL — ABNORMAL LOW (ref 4.5–10.5)

## 2012-12-29 LAB — HEPATIC FUNCTION PANEL
ALT: 20 U/L (ref 0–35)
AST: 23 U/L (ref 0–37)
Alkaline Phosphatase: 77 U/L (ref 39–117)
Bilirubin, Direct: 0 mg/dL (ref 0.0–0.3)
Total Protein: 6.9 g/dL (ref 6.0–8.3)

## 2012-12-29 LAB — LIPID PANEL
LDL Cholesterol: 93 mg/dL (ref 0–99)
Total CHOL/HDL Ratio: 2

## 2012-12-29 LAB — BASIC METABOLIC PANEL
CO2: 27 mEq/L (ref 19–32)
Chloride: 105 mEq/L (ref 96–112)
Sodium: 140 mEq/L (ref 135–145)

## 2012-12-29 NOTE — Progress Notes (Signed)
  Subjective:    Patient ID: Patricia [name], female    DOB: [birth year], 72 y.o.   MRN: [id number]  HPI Here today for CPE.  Risk Factors: HTN- chronic problem, well controlled on Diovan.  No CP, SOB, HAs, visual changes, edema. Hyperlipidemia- chronic problem, on Zocor.  No abd pain, N/V, myalgias Physical Activity: exercising regularly Fall Risk: low Depression: no current sxs Hearing: normal to conversational tones, decreased to whispered voice ADL's: independent Cognitive: normal linear thought process, memory and attention intact Home Safety: safe at home Height, Weight, BMI, Visual Acuity: see vitals, vision corrected to 20/20 w/ glasses Counseling: UTD on colonoscopy, mammo, pap, DEXA Labs Ordered: See A&P Care Plan: See A&P    Review of Systems Patient reports no vision/ hearing changes, adenopathy,fever, weight change,  persistant/recurrent hoarseness , swallowing issues, chest pain, palpitations, edema, persistant/recurrent cough, hemoptysis, dyspnea (rest/exertional/paroxysmal nocturnal), gastrointestinal bleeding (melena, rectal bleeding), abdominal pain, significant heartburn, bowel changes, GU symptoms (dysuria, hematuria, incontinence), Gyn symptoms (abnormal  bleeding, pain),  syncope, focal weakness, memory loss, numbness & tingling, skin/hair/nail changes, abnormal bruising or bleeding, anxiety, or depression.     Objective:   Physical Exam General Appearance:    Alert, cooperative, no distress, appears stated age  Head:    Normocephalic, without obvious abnormality, atraumatic  Eyes:    PERRL, conjunctiva/corneas clear, EOM's intact, fundi    benign, both eyes  Ears:    Normal TM's and external ear canals, both ears  Nose:   Nares normal, septum midline, mucosa normal, no drainage    or sinus tenderness  Throat:   Lips, mucosa, and tongue normal; teeth and gums normal  Neck:   Supple, symmetrical, trachea midline, no adenopathy;    Thyroid: no  enlargement/tenderness/nodules  Back:     Symmetric, no curvature, ROM normal, no CVA tenderness  Lungs:     Clear to auscultation bilaterally, respirations unlabored  Chest Wall:    No tenderness or deformity   Heart:    Regular rate and rhythm, S1 and S2 normal, no murmur, rub   or gallop  Breast Exam:    Deferred to mammo  Abdomen:     Soft, non-tender, bowel sounds active all four quadrants,    no masses, no organomegaly  Genitalia:    Deferred  Rectal:    Extremities:   Extremities normal, atraumatic, no cyanosis or edema  Pulses:   2+ and symmetric all extremities  Skin:   Skin color, texture, turgor normal, no rashes or lesions  Lymph nodes:   Cervical, supraclavicular, and axillary nodes normal  Neurologic:   CNII-XII intact, normal strength, sensation and reflexes    throughout          Assessment & Plan:

## 2012-12-29 NOTE — Assessment & Plan Note (Signed)
Chronic problem.  Good control.  Asymptomatic.  Check labs.  No anticipated med changes.

## 2012-12-29 NOTE — Assessment & Plan Note (Signed)
Pt's PE WNL.  UTD on health maintenance.  Check labs.  Anticipatory guidance provided.  

## 2012-12-29 NOTE — Patient Instructions (Signed)
Follow up in 6 months to recheck BP and cholesterol We'll notify you of your lab results and make any changes if needed Keep up the good work!  You look great! Go get your mammo at your convenience Call with any questions or concerns Happy Holidays and have a wonderful trip!!

## 2012-12-29 NOTE — Assessment & Plan Note (Signed)
Chronic problem.  Tolerating statin w/out difficulty.  Check labs.  Adjust meds prn  

## 2012-12-30 ENCOUNTER — Encounter: Payer: Self-pay | Admitting: General Practice

## 2012-12-30 LAB — MEASLES/MUMPS/RUBELLA IMMUNITY
Mumps IgG: 188 AU/mL — ABNORMAL HIGH (ref <9.00)
Rubella: 7.38 Index — ABNORMAL HIGH (ref <0.90)

## 2012-12-30 LAB — HEPATITIS B SURFACE ANTIBODY, QUANTITATIVE: Hepatitis B-Post: 1.8 m[IU]/mL

## 2013-01-11 ENCOUNTER — Encounter: Payer: Self-pay | Admitting: Endocrinology

## 2013-01-11 ENCOUNTER — Ambulatory Visit (INDEPENDENT_AMBULATORY_CARE_PROVIDER_SITE_OTHER): Payer: Medicare Other | Admitting: Endocrinology

## 2013-01-11 VITALS — BP 130/80 | HR 80 | Temp 97.7°F | Resp 20 | Ht <= 58 in | Wt 143.5 lb

## 2013-01-11 DIAGNOSIS — E042 Nontoxic multinodular goiter: Secondary | ICD-10-CM | POA: Diagnosis not present

## 2013-01-11 NOTE — Progress Notes (Signed)
Subjective:    Patient ID: Patricia [name], female    DOB: 10-22-1940, 72 y.o.   MRN: [id number]  HPI In January, 2014, pt had i-131 rx for hyperthyroidism, due to a multinodular goiter.  pt states she feels well in general, except for weight gain.   Past Medical History  Diagnosis Date  . Cough variant asthma   . Osteopenia   . Stricture and stenosis of cervix   . Postmenopausal   . GERD (gastroesophageal reflux disease)   . Disease of pharynx or nasopharynx   . Elevated BP   . Neoplasm   . Personal history of malignant neoplasm of bronchus and lung   . Allergic rhinitis, cause unspecified   . Diverticulosis   . Internal hemorrhoids   . Hiatal hernia   . Hypertension   . Laryngospasm   . 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 Hyperlipidemia   . Glaucoma   . Laryngospasm   . Multinodular goiter     Past Surgical History  Procedure Laterality Date  . Lung cancer surgery      resection carcinoid lingula  . Breast biopsy    . Lung surgery      carcinoid tumor removed    History   Social History  . Marital Status: Married    Spouse Name: N/A    Number of Children: N/A  . Years of Education: N/A   Occupational History  . Not on file.   Social History Main Topics  . Smoking status: Former Games developer  . Smokeless tobacco: Not on file  . Alcohol Use: No  . Drug Use: No  . Sexual Activity: Not on file   Other Topics Concern  . Not on file   Social History Narrative  . No narrative on file    Current Outpatient Prescriptions on File Prior to Visit  Medication Sig Dispense Refill  . albuterol (VENTOLIN HFA) 108 (90 BASE) MCG/ACT inhaler Inhale 2 puffs into the lungs every 4 (four) hours as needed.  1 Inhaler  3  . Aspirin (ADULT ASPIRIN LOW STRENGTH) 81 MG EC tablet Take 81 mg by mouth daily.        . bimatoprost (LUMIGAN) 0.03 % ophthalmic drops 1 drop at bedtime.        . chlorpheniramine (CHLOR-TRIMETON) 4 MG tablet Take 4 mg by mouth. One every  6 hours as needed for nasal drainage       . DIOVAN 40 MG tablet TAKE 1 TABLET BY MOUTH DAILY  90 tablet  3  . fluticasone-salmeterol (ADVAIR HFA) 230-21 MCG/ACT inhaler Inhale 2 puffs into the lungs 2 (two) times daily.  3 Inhaler  3  . gabapentin (NEURONTIN) 400 MG capsule Take 1 capsule (400 mg total) by mouth 2 (two) times daily.  180 capsule  2  . loratadine (CLARITIN) 10 MG tablet Take 1 tablet (10 mg total) by mouth daily.  30 tablet  0  . Multiple Vitamins-Iron (MULTIVITAMIN/IRON) TABS Take by mouth daily.        Marland Kitchen omeprazole (PRILOSEC) 20 MG capsule TAKE 1 CAPSULE DAILY 30 MINUTES BEFORE A MEAL  90 capsule  1  . simvastatin (ZOCOR) 40 MG tablet TAKE 1 TABLET BY MOUTH AT BEDTIME  90 tablet  3   No current facility-administered medications on file prior to visit.   Allergies  Allergen Reactions  . Combigan [Brimonidine Tartrate-Timolol]     Eyes itch, reddened  . Sulfonamide Derivatives  Family History  Problem Relation Age of Onset  . Stroke Mother   . Hypertension Mother   . Hyperlipidemia Mother   . Stroke Father   . Transient ischemic attack Father   . Hyperlipidemia Father   . Hypertension Father    BP 130/80  Pulse 80  Temp(Src) 97.7 F (36.5 C) (Oral)  Resp 20  Ht 4\' 10"  (1.473 m)  Wt 143 lb 8 oz (65.091 kg)  BMI 30.00 kg/m2  Review of Systems Denies tremor.    Objective:   Physical Exam VITAL SIGNS:  See vs page GENERAL: no distress NECK: 2 cm right thyroid nodule, unchanged.    Lab Results  Component Value Date   TSH 4.04 12/29/2012      Assessment & Plan:  Hyperthyroidism, due to multinodular goiter.  Resolved with i-131 rx.  She is at risk for the development of hypothyroidism, or recurrent hyperthyroidism.

## 2013-01-11 NOTE — Patient Instructions (Addendum)
No medication is needed for the thyroid now Let's check the thyroid ultrasound, so we can follow this in the future. Please come back for a follow-up appointment in 6 months

## 2013-01-12 ENCOUNTER — Other Ambulatory Visit: Payer: Self-pay | Admitting: Family Medicine

## 2013-01-12 NOTE — Telephone Encounter (Signed)
Med faxed   

## 2013-01-12 NOTE — Telephone Encounter (Signed)
Last ov 12-29-12 Med filled 11-30-12 345 with 0

## 2013-01-13 ENCOUNTER — Other Ambulatory Visit: Payer: Self-pay | Admitting: Family Medicine

## 2013-01-16 ENCOUNTER — Ambulatory Visit
Admission: RE | Admit: 2013-01-16 | Discharge: 2013-01-16 | Disposition: A | Discharge status: Home / Self Care | Payer: Medicare Other | Source: Ambulatory Visit | Attending: Endocrinology | Admitting: Endocrinology

## 2013-01-16 DIAGNOSIS — E041 Nontoxic single thyroid nodule: Secondary | ICD-10-CM | POA: Diagnosis not present

## 2013-01-16 NOTE — Telephone Encounter (Signed)
Med filled.  

## 2013-02-09 ENCOUNTER — Encounter: Payer: Self-pay | Admitting: Family Medicine

## 2013-02-21 ENCOUNTER — Other Ambulatory Visit: Payer: Self-pay | Admitting: Family Medicine

## 2013-02-21 NOTE — Telephone Encounter (Signed)
Last OV 12-29-12 Med filled 01-12-13 #45 with 0  Low Risk

## 2013-02-21 NOTE — Telephone Encounter (Signed)
Med filled and faxed.  

## 2013-02-24 ENCOUNTER — Other Ambulatory Visit: Payer: Self-pay | Admitting: General Practice

## 2013-02-24 MED ORDER — FLUTICASONE-SALMETEROL 230-21 MCG/ACT IN AERO: 2.0000 | INHALATION_SPRAY | Freq: Two times a day (BID) | RESPIRATORY_TRACT | Status: DC

## 2013-03-09 DIAGNOSIS — H2510 Age-related nuclear cataract, unspecified eye: Secondary | ICD-10-CM | POA: Diagnosis not present

## 2013-03-09 DIAGNOSIS — H4011X0 Primary open-angle glaucoma, stage unspecified: Secondary | ICD-10-CM | POA: Diagnosis not present

## 2013-03-09 DIAGNOSIS — H04129 Dry eye syndrome of unspecified lacrimal gland: Secondary | ICD-10-CM | POA: Diagnosis not present

## 2013-03-09 DIAGNOSIS — H409 Unspecified glaucoma: Secondary | ICD-10-CM | POA: Diagnosis not present

## 2013-04-11 ENCOUNTER — Other Ambulatory Visit: Payer: Self-pay | Admitting: Family Medicine

## 2013-04-11 NOTE — Telephone Encounter (Signed)
Med filled and faxed.  

## 2013-04-11 NOTE — Telephone Encounter (Signed)
Last OV 12-29-12 Med filled 02-21-13 #45 with 0  Low Risk

## 2013-04-13 DIAGNOSIS — H4011X0 Primary open-angle glaucoma, stage unspecified: Secondary | ICD-10-CM | POA: Diagnosis not present

## 2013-04-13 DIAGNOSIS — H409 Unspecified glaucoma: Secondary | ICD-10-CM | POA: Diagnosis not present

## 2013-04-30 ENCOUNTER — Other Ambulatory Visit: Payer: Self-pay | Admitting: Family Medicine

## 2013-05-01 NOTE — Telephone Encounter (Signed)
Med filled.  

## 2013-05-03 ENCOUNTER — Other Ambulatory Visit: Payer: Self-pay | Admitting: Family Medicine

## 2013-05-03 NOTE — Telephone Encounter (Signed)
Med filled.  

## 2013-05-26 DIAGNOSIS — N63 Unspecified lump in unspecified breast: Secondary | ICD-10-CM | POA: Diagnosis not present

## 2013-05-26 DIAGNOSIS — N6459 Other signs and symptoms in breast: Secondary | ICD-10-CM | POA: Diagnosis not present

## 2013-05-26 DIAGNOSIS — Z1231 Encounter for screening mammogram for malignant neoplasm of breast: Secondary | ICD-10-CM | POA: Diagnosis not present

## 2013-05-26 LAB — HM MAMMOGRAPHY

## 2013-05-29 ENCOUNTER — Other Ambulatory Visit: Payer: Self-pay | Admitting: Family Medicine

## 2013-05-29 NOTE — Telephone Encounter (Signed)
Last OV 12-29-12 Med filled 04-11-13  Low risk

## 2013-05-29 NOTE — Telephone Encounter (Signed)
Med filled and faxed.  

## 2013-05-31 ENCOUNTER — Encounter: Payer: Self-pay | Admitting: General Practice

## 2013-06-02 DIAGNOSIS — Z1231 Encounter for screening mammogram for malignant neoplasm of breast: Secondary | ICD-10-CM | POA: Diagnosis not present

## 2013-06-02 DIAGNOSIS — N6459 Other signs and symptoms in breast: Secondary | ICD-10-CM | POA: Diagnosis not present

## 2013-06-02 DIAGNOSIS — N63 Unspecified lump in unspecified breast: Secondary | ICD-10-CM | POA: Diagnosis not present

## 2013-06-02 LAB — HM MAMMOGRAPHY

## 2013-06-05 ENCOUNTER — Other Ambulatory Visit: Payer: Self-pay

## 2013-06-05 DIAGNOSIS — C50919 Malignant neoplasm of unspecified site of unspecified female breast: Secondary | ICD-10-CM | POA: Diagnosis not present

## 2013-06-08 ENCOUNTER — Telehealth: Payer: Self-pay | Admitting: *Deleted

## 2013-06-08 ENCOUNTER — Other Ambulatory Visit: Payer: Self-pay | Admitting: Radiology

## 2013-06-08 DIAGNOSIS — C50912 Malignant neoplasm of unspecified site of left female breast: Secondary | ICD-10-CM

## 2013-06-08 DIAGNOSIS — C50412 Malignant neoplasm of upper-outer quadrant of left female breast: Secondary | ICD-10-CM | POA: Insufficient documentation

## 2013-06-08 DIAGNOSIS — Z170 Estrogen receptor positive status [ER+]: Secondary | ICD-10-CM

## 2013-06-08 NOTE — Telephone Encounter (Signed)
Received call back from patient and confirmed [facility] appointment for 06/14/13 at 8am.  Instructions and contact information given.  Encouraged patient to call with any needs.

## 2013-06-08 NOTE — Telephone Encounter (Signed)
Left message for a return phone call to schedule patient for [facility] 06/14/13.  Awaiting patient response.

## 2013-06-13 ENCOUNTER — Ambulatory Visit
Admission: RE | Admit: 2013-06-13 | Discharge: 2013-06-13 | Disposition: A | Discharge status: Home / Self Care | Payer: Medicare Other | Source: Ambulatory Visit | Attending: Radiology | Admitting: Radiology

## 2013-06-13 DIAGNOSIS — C50919 Malignant neoplasm of unspecified site of unspecified female breast: Secondary | ICD-10-CM | POA: Diagnosis not present

## 2013-06-13 DIAGNOSIS — C50912 Malignant neoplasm of unspecified site of left female breast: Secondary | ICD-10-CM

## 2013-06-13 MED ORDER — GADOBENATE DIMEGLUMINE 529 MG/ML IV SOLN
13.0000 mL | Freq: Once | INTRAVENOUS | Status: AC | PRN
  Administered 2013-06-13: 13 mL via INTRAVENOUS

## 2013-06-14 ENCOUNTER — Other Ambulatory Visit: Payer: Self-pay

## 2013-06-14 ENCOUNTER — Ambulatory Visit (HOSPITAL_BASED_OUTPATIENT_CLINIC_OR_DEPARTMENT_OTHER): Payer: Medicare Other | Admitting: General Surgery

## 2013-06-14 ENCOUNTER — Ambulatory Visit (HOSPITAL_BASED_OUTPATIENT_CLINIC_OR_DEPARTMENT_OTHER): Payer: Medicare Other | Admitting: Oncology

## 2013-06-14 ENCOUNTER — Ambulatory Visit: Payer: Medicare Other | Attending: General Surgery | Admitting: Physical Therapy

## 2013-06-14 ENCOUNTER — Ambulatory Visit
Admission: RE | Admit: 2013-06-14 | Discharge: 2013-06-14 | Disposition: A | Discharge status: Home / Self Care | Payer: Medicare Other | Source: Ambulatory Visit | Attending: Radiation Oncology | Admitting: Radiation Oncology

## 2013-06-14 ENCOUNTER — Telehealth: Payer: Self-pay | Admitting: Oncology

## 2013-06-14 ENCOUNTER — Other Ambulatory Visit (HOSPITAL_BASED_OUTPATIENT_CLINIC_OR_DEPARTMENT_OTHER): Payer: Medicare Other

## 2013-06-14 ENCOUNTER — Ambulatory Visit: Payer: Medicare Other

## 2013-06-14 ENCOUNTER — Encounter: Payer: Self-pay | Admitting: Oncology

## 2013-06-14 VITALS — BP 164/99 | HR 59 | Temp 97.9°F | Resp 18 | Ht <= 58 in | Wt 145.3 lb

## 2013-06-14 DIAGNOSIS — R293 Abnormal posture: Secondary | ICD-10-CM | POA: Diagnosis not present

## 2013-06-14 DIAGNOSIS — M8580 Other specified disorders of bone density and structure, unspecified site: Secondary | ICD-10-CM

## 2013-06-14 DIAGNOSIS — C50919 Malignant neoplasm of unspecified site of unspecified female breast: Secondary | ICD-10-CM

## 2013-06-14 DIAGNOSIS — M949 Disorder of cartilage, unspecified: Secondary | ICD-10-CM

## 2013-06-14 DIAGNOSIS — M899 Disorder of bone, unspecified: Secondary | ICD-10-CM

## 2013-06-14 DIAGNOSIS — C50419 Malignant neoplasm of upper-outer quadrant of unspecified female breast: Secondary | ICD-10-CM | POA: Diagnosis not present

## 2013-06-14 DIAGNOSIS — Z170 Estrogen receptor positive status [ER+]: Secondary | ICD-10-CM | POA: Diagnosis not present

## 2013-06-14 DIAGNOSIS — C50412 Malignant neoplasm of upper-outer quadrant of left female breast: Secondary | ICD-10-CM

## 2013-06-14 DIAGNOSIS — IMO0001 Reserved for inherently not codable concepts without codable children: Secondary | ICD-10-CM | POA: Diagnosis not present

## 2013-06-14 LAB — CBC WITH DIFFERENTIAL/PLATELET
BASO%: 0.4 % (ref 0.0–2.0)
BASOS ABS: 0 10*3/uL (ref 0.0–0.1)
EOS%: 0.1 % (ref 0.0–7.0)
Eosinophils Absolute: 0 10*3/uL (ref 0.0–0.5)
HEMATOCRIT: 41.5 % (ref 34.8–46.6)
HEMOGLOBIN: 14.1 g/dL (ref 11.6–15.9)
LYMPH#: 1.4 10*3/uL (ref 0.9–3.3)
LYMPH%: 23.3 % (ref 14.0–49.7)
MCH: 31 pg (ref 25.1–34.0)
MCHC: 33.9 g/dL (ref 31.5–36.0)
MCV: 91.4 fL (ref 79.5–101.0)
MONO#: 0.5 10*3/uL (ref 0.1–0.9)
MONO%: 8.1 % (ref 0.0–14.0)
NEUT#: 4 10*3/uL (ref 1.5–6.5)
NEUT%: 68.1 % (ref 38.4–76.8)
PLATELETS: 269 10*3/uL (ref 145–400)
RBC: 4.55 10*6/uL (ref 3.70–5.45)
RDW: 13.1 % (ref 11.2–14.5)
WBC: 5.9 10*3/uL (ref 3.9–10.3)

## 2013-06-14 LAB — COMPREHENSIVE METABOLIC PANEL (CC13)
ALT: 14 U/L (ref 0–55)
AST: 18 U/L (ref 5–34)
Albumin: 3.7 g/dL (ref 3.5–5.0)
Alkaline Phosphatase: 79 U/L (ref 40–150)
Anion Gap: 8 mEq/L (ref 3–11)
BUN: 22 mg/dL (ref 7.0–26.0)
CALCIUM: 9.1 mg/dL (ref 8.4–10.4)
CHLORIDE: 112 meq/L — AB (ref 98–109)
CO2: 22 mEq/L (ref 22–29)
CREATININE: 0.8 mg/dL (ref 0.6–1.1)
Glucose: 79 mg/dl (ref 70–140)
Potassium: 4.3 mEq/L (ref 3.5–5.1)
Sodium: 142 mEq/L (ref 136–145)
Total Bilirubin: 0.45 mg/dL (ref 0.20–1.20)
Total Protein: 6.5 g/dL (ref 6.4–8.3)

## 2013-06-14 MED ORDER — ANASTROZOLE 1 MG PO TABS: 1.0000 mg | ORAL_TABLET | Freq: Every day | ORAL | Status: DC

## 2013-06-14 NOTE — Progress Notes (Signed)
Checked in new patient with no financial asst. She has appt card and [facility] packet. [place] been [place]

## 2013-06-14 NOTE — Telephone Encounter (Signed)
, °

## 2013-06-14 NOTE — Progress Notes (Signed)
Radiation Oncology         [phone]) [phone] ________________________________  Initial outpatient Consultation - Date: 06/14/2013   Name: Patricia [name] MRN: [id number]   DOB: Feb 27, 1941  REFERRING PHYSICIAN: Stark Klein, MD  DIAGNOSIS:  1. Breast cancer of upper-outer quadrant of left female breast     STAGE: Breast cancer of upper-outer quadrant of left female breast   Primary site: Breast (Left)   Staging method: AJCC 7th Edition   Clinical: Stage IIA (T2, N0, cM0)   Summary: Stage IIA (T2, N0, cM0)   Clinical comments: Staged at breast conference 06/14/13  HISTORY OF PRESENT ILLNESS::Patricia [name] is a 73 y.o. female  was noted to have architectural distortion. A 2 cm irregular mass was noted. A biopsy showed invasive mammary carcinoma which is likely lobular. ER positive PR negative Ki-67 was 20%. MRI of the bilateral breasts showed a 1.9 x 2.8 x 1.6 cm mass. The right breast was negative. The tumor was ER positive PR negative Ki-67 was 20%. There was not enough tissue to perform HER-2 testing. He is accompanied by her daughter-in-law and 2 friends. She is very active and lives alone. She has elected for neoadjuvant antiestrogen therapy. She had menses at 13 and her date of her last period was over 50 years ago. She asked P2 with her first live birth 6. He took hormone replacement for a year but quit many years ago. She has no family history of malignancy.Marland Kitchen  PREVIOUS RADIATION THERAPY: No  PAST MEDICAL HISTORY:  has a past medical history of Cough variant asthma; Osteopenia; Stricture and stenosis of cervix; Postmenopausal; GERD (gastroesophageal reflux disease); Disease of pharynx or nasopharynx; Elevated BP; Neoplasm; Personal history of malignant neoplasm of bronchus and lung; Allergic rhinitis, cause unspecified; Diverticulosis; Internal hemorrhoids; Hiatal hernia; Hypertension; Laryngospasm; Cancer; Hyperlipidemia; Glaucoma; Laryngospasm; and Multinodular goiter.    PAST SURGICAL  HISTORY: Past Surgical History  Procedure Laterality Date  . Lung cancer surgery      resection carcinoid lingula  . Breast biopsy    . Lung surgery      carcinoid tumor removed    FAMILY HISTORY:  Family History  Problem Relation Age of Onset  . Stroke Mother   . Hypertension Mother   . Hyperlipidemia Mother   . Stroke Father   . Transient ischemic attack Father   . Hyperlipidemia Father   . Hypertension Father     SOCIAL HISTORY:  History  Substance Use Topics  . Smoking status: Former Smoker    Quit date: 06/15/1963  . Smokeless tobacco: Not on file  . Alcohol Use: Yes     Comment: occasional wine    ALLERGIES: Combigan and Sulfonamide derivatives  MEDICATIONS:  Current Outpatient Prescriptions  Medication Sig Dispense Refill  . albuterol (VENTOLIN HFA) 108 (90 BASE) MCG/ACT inhaler Inhale 2 puffs into the lungs every 4 (four) hours as needed.  1 Inhaler  3  . Aspirin (ADULT ASPIRIN LOW STRENGTH) 81 MG EC tablet Take 81 mg by mouth daily.        . bimatoprost (LUMIGAN) 0.03 % ophthalmic drops 1 drop at bedtime.        . chlorpheniramine (CHLOR-TRIMETON) 4 MG tablet Take 4 mg by mouth. One every 6 hours as needed for nasal drainage       . DIOVAN 40 MG tablet TAKE 1 TABLET DAILY  90 tablet  1  . fluticasone-salmeterol (ADVAIR HFA) 230-21 MCG/ACT inhaler Inhale 2 puffs into the lungs 2 (two) times daily.  3 Inhaler  3  . gabapentin (NEURONTIN) 400 MG capsule TAKE 1 CAPSULE TWO TIMES DAILY  180 capsule  1  . loratadine (CLARITIN) 10 MG tablet Take 1 tablet (10 mg total) by mouth daily.  30 tablet  0  . Multiple Vitamins-Iron (MULTIVITAMIN/IRON) TABS Take by mouth daily.        Marland Kitchen omeprazole (PRILOSEC) 20 MG capsule TAKE 1 CAPSULE DAILY 30 MINUTES BEFORE A MEAL  90 capsule  1  . simvastatin (ZOCOR) 40 MG tablet TAKE 1 TABLET AT BEDTIME  90 tablet  0  . traMADol (ULTRAM) 50 MG tablet TAKE 1 TABLET EVERY 6 HOURS AS NEEDED FOR PAIN  45 tablet  0   No current  facility-administered medications for this encounter.    REVIEW OF SYSTEMS:  A 15 point review of systems is documented in the electronic medical record. This was obtained by the nursing staff. However, I reviewed this with the patient to discuss relevant findings and make appropriate changes.  Pertinent items are noted in HPI.  PHYSICAL EXAM: There were no vitals filed for this visit.. . Pleasant female in no distress sitting comfortably on examining table. She appears younger than her stated age. She has no palpable cervical or supraclavicular adenopathy. No palpable axillary adenopathy bilaterally. The right breast has no palpable abnormalities. There is an upper outer quadrant fullness although no discrete mass in the left breast. She is alert and oriented x3.  LABORATORY DATA:  Lab Results  Component Value Date   WBC 5.9 06/14/2013   HGB 14.1 06/14/2013   HCT 41.5 06/14/2013   MCV 91.4 06/14/2013   PLT 269 06/14/2013   Lab Results  Component Value Date   NA 142 06/14/2013   K 4.3 06/14/2013   CL 105 12/29/2012   CO2 22 06/14/2013   Lab Results  Component Value Date   ALT 14 06/14/2013   AST 18 06/14/2013   ALKPHOS 79 06/14/2013   BILITOT 0.45 06/14/2013     RADIOGRAPHY: Mr Breast Bilateral W Wo Contrast  06/13/2013   CLINICAL DATA:  73 year old female with recently diagnosed invasive mammary carcinoma of the left breast at 12 o'clock (at palpable mass at site of distortion).  LABS:  BUN and creatinine were obtained on site at [place] at  [address]  Results:  BUN 21 mg/dL,  Creatinine 0 point No mg/dL.  EXAM: BILATERAL BREAST MRI WITH AND WITHOUT CONTRAST  TECHNIQUE: Multiplanar, multisequence MR images of both breasts were obtained prior to and following the intravenous administration of 60m of MultiHance.  THREE-DIMENSIONAL MR IMAGE RENDERING ON INDEPENDENT WORKSTATION:  Three-dimensional MR images were rendered by post-processing of the original MR data on an  independent workstation. The three-dimensional MR images were interpreted, and findings are reported in the following complete MRI report for this study. Three dimensional images were evaluated at the independent DynaCad workstation  COMPARISON:  Previous exams.  FINDINGS: Breast composition: c:  Heterogeneous fibroglandular tissue  Background parenchymal enhancement: Minimal.  Right breast: No suspicious rapidly enhancing masses or abnormal areas of enhancement in the right breast to suggest malignancy.  Left breast: There is an irregular enhancing mass/ area of distortion in the superior left breast at the 12 o'clock position measuring approximately 1.9 cm AP, 2.8 cm transverse, and approximately 1.6 cm craniocaudal. The biopsy marking clip is present along the lateral margin of the mass. No additional suspicious enhancing masses or abnormal areas of enhancement are seen in the left breast to  suggest additional sites of disease.  Lymph nodes: No morphologically abnormal axillary lymph nodes. No internal mammary lymphadenopathy.  Ancillary findings:  None.  IMPRESSION: 1. Biopsy proven malignancy in the superior left breast at 12 o'clock measuring up to 2.8 cm transverse. No findings to suggest multifocal or multicentric disease.  2. No MRI evidence of malignancy in the right breast.  RECOMMENDATION: Treatment plan for known left breast malignancy.  BI-RADS CATEGORY  6: Known biopsy-proven malignancy - appropriate action should be taken.   Electronically Signed   By: Everlean Alstrom M.D.   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 Wechter. She is very interested in breast conservation. This will require neoadjuvant treatment and she elected for neoadjuvant antiestrogen treatment. She is going to proceed without for 6-8 weeks and undergo reevaluation. We discussed that if she did have a lumpectomy she will require  her post lumpectomy radiation. We discussed the process of simulation the placement tattoos. We discussed 3-6 weeks of treatment as an outpatient. We discussed in redness and fatigue as possible side effects. She will need her to performed on her surgical specimen. We discussed that she could also elected for a mastectomy at any point. At this point she doesn't have any indications for postmastectomy radiation however with her lobular cancer and given its size I would hold off on immediate reconstruction which she may or may not be interested in at this point anyway. We discussed the low likelihood of symptomatic lung Rippon heart damage. We discussed the low likelihood of radiation-induced malignancies. I'll plan on seeing her back after surgery.  I spent 60 minutes  face to face with the patient and more than 50% of that time was spent in counseling and/or coordination of care.   ------------------------------------------------  Thea Silversmith, MD

## 2013-06-14 NOTE — Assessment & Plan Note (Signed)
Patient is to undergo a short course of neoadjuvant hormones.  If she does have some significant response, we would continue for a short period of time longer. I am concerned that if we went straight for lumpectomy, that we would have difficulty getting a good margin with a good cosmetic result.  She will need a lumpectomy with sentinel lymph node by biopsy versus a mastectomy with sentinel node biopsy.  She is going to receive anastrozole. I will see her back at the midpoint of treatment.    We briefly discussed what surgery would entail. I advised her to write down any questions that she comes up with in order for [name] to answer them at her next clinic visit. She should not need any cardiac clearance prior to surgery. She is not on any blood thinners other than baby aspirin.    45 min spent with evaluation, examination, counseling, and coordination of care.    >50% spent in counseling.

## 2013-06-14 NOTE — Progress Notes (Signed)
[place]  Telephone:([phone]) [phone] Fax:(336) [phone]     ID: Patricia [name] OB: 1940-07-22  MR#: [id number]  ERD#:[id number]  PCP: Annye Asa, MD GYN:   SU: Stark Klein OTHER MD: Thea Silversmith, Charlena Cross   CHIEF COMPLAINT: "I have breast cancer"  BREAST CANCER HISTORY: Patricia [name] had screening mammography with tomography at [facility] 05/26/2013 showing some architectural distortion in the left breast superiorly. Ultrasound of the left breast 06/02/2013 showed a 2 cm irregular mass superiorly in the left breast, medial to the scar on the skin from a prior biopsy. This mass was palpable. 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  On 06/13/2013 the patient underwent bilateral breast MRI. This showed a 2.8 cm irregular enhancing mass in the superior left breast but no additional suspicious masses, and no abnormal adenopathy, and no findings in the right breast.  The patient's subsequent history is as detailed below.  INTERVAL HISTORY: Patricia [name] was evaluated in the multidisciplinary breast cancer clinic 06/14/2013 accompanied by her friends Patricia [name], and Patricia [name]. Her case was also discussed at the multidisciplinary breast cancer conference that morning  REVIEW OF SYSTEMS: There were no specific symptoms leading to the original mammogram, which was routinely scheduled. The patient denies unusual headaches, visual changes, nausea, vomiting, stiff neck, dizziness, or gait imbalance. There has been no cough, phlegm production, or pleurisy, no chest pain or pressure, and no change in bowel or bladder habits. The patient denies fever, rash, bleeding, unexplained fatigue or unexplained weight loss. A detailed review of systems was otherwise entirely  negative.  PAST MEDICAL HISTORY: Past Medical History  Diagnosis Date  . Cough variant asthma   . Osteopenia   . Stricture and stenosis of cervix   . Postmenopausal   . GERD (gastroesophageal reflux disease)   . Disease of pharynx or nasopharynx   . Elevated BP   . Neoplasm   . Personal history of malignant neoplasm of bronchus and lung   . Allergic rhinitis, cause unspecified   . Diverticulosis   . Internal hemorrhoids   . Hiatal hernia   . Hypertension   . Laryngospasm   . 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 Hyperlipidemia   . Glaucoma   . Laryngospasm   . Multinodular goiter     PAST SURGICAL HISTORY: Past Surgical History  Procedure Laterality Date  . Lung cancer surgery      resection carcinoid lingula  . Breast biopsy    . Lung surgery      carcinoid tumor removed    FAMILY HISTORY Family History  Problem Relation Age of Onset  . Stroke Mother   . Hypertension Mother   . Hyperlipidemia Mother   . Stroke Father   . Transient ischemic attack Father   . Hyperlipidemia Father   . Hypertension Father    the patient's father died from pneumonia the age of 66, in the setting of dementia. The patient's mother died at the age of 95 following a stroke. The patient had one brother, 4 sisters. There is no history of breast or ovarian cancer in the family  GYNECOLOGIC HISTORY:  Menarche age 51, first live birth age 33, she is [name]. She did not use hormone replacement at menopause. She never took birth control pills.   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 She does a little bit of swelling on the side. Her husband Patricia [name] is retired from the [employer] and then worked for her [employer]. He is an avid Animator. Son Patricia [name] lives in [place] and is an Art gallery manager. Daughter Patricia [name] lives in [place] and is a Astronomer. The patient has 5 grandchildren. She attends the [employer]    ADVANCED  DIRECTIVES: In place   HEALTH MAINTENANCE: History  Substance Use Topics  . Smoking status: Former Smoker    Quit date: 06/15/1963  . Smokeless tobacco: Not on file  . Alcohol Use: Yes     Comment: occasional wine     Colonoscopy:  PAP:  Bone density: 2012 at [place], "normal"  Lipid panel:  Allergies  Allergen Reactions  . Combigan [Brimonidine Tartrate-Timolol]     Eyes itch, reddened  . Sulfonamide Derivatives     Current Outpatient Prescriptions  Medication Sig Dispense Refill  . albuterol (VENTOLIN HFA) 108 (90 BASE) MCG/ACT inhaler Inhale 2 puffs into the lungs every 4 (four) hours as needed.  1 Inhaler  3  . Aspirin (ADULT ASPIRIN LOW STRENGTH) 81 MG EC tablet Take 81 mg by mouth daily.        . bimatoprost (LUMIGAN) 0.03 % ophthalmic drops 1 drop at bedtime.        . chlorpheniramine (CHLOR-TRIMETON) 4 MG tablet Take 4 mg by mouth. One every 6 hours as needed for nasal drainage       . DIOVAN 40 MG tablet TAKE 1 TABLET DAILY  90 tablet  1  . fluticasone-salmeterol (ADVAIR HFA) 230-21 MCG/ACT inhaler Inhale 2 puffs into the lungs 2 (two) times daily.  3 Inhaler  3  . gabapentin (NEURONTIN) 400 MG capsule TAKE 1 CAPSULE TWO TIMES DAILY  180 capsule  1  . loratadine (CLARITIN) 10 MG tablet Take 1 tablet (10 mg total) by mouth daily.  30 tablet  0  . Multiple Vitamins-Iron (MULTIVITAMIN/IRON) TABS Take by mouth daily.        Marland Kitchen omeprazole (PRILOSEC) 20 MG capsule TAKE 1 CAPSULE DAILY 30 MINUTES BEFORE A MEAL  90 capsule  1  . simvastatin (ZOCOR) 40 MG tablet TAKE 1 TABLET AT BEDTIME  90 tablet  0  . traMADol (ULTRAM) 50 MG tablet TAKE 1 TABLET EVERY 6 HOURS AS NEEDED FOR PAIN  45 tablet  0   No current facility-administered medications for this visit.    OBJECTIVE: Middle-aged white woman in no acute distress Filed Vitals:   06/14/13 0857  BP: 164/99  Pulse: 59  Temp: 97.9 F (36.6 C)  Resp: 18     Body mass index is 30.38 kg/(m^2).    ECOG FS:0 -  Asymptomatic  Ocular: Sclerae unicteric, pupils equal, round and reactive to light Ear-nose-throat: Oropharynx clear, dentition fair Lymphatic: No cervical or supraclavicular adenopathy Lungs no rales or rhonchi, good excursion bilaterally Heart regularly irregular rhythm (bigeminy), no murmur appreciated Abd soft, nontender, positive bowel sounds MSK no focal spinal tenderness, no joint edema Neuro: non-focal, well-oriented, appropriate affect Breasts: The right breast is unremarkable. The left breast is status post recent biopsy and remote biopsy. There is an area superiorly which is thickened, measuring approximately 2 cm. The patient however tells me this has been there for many years and has not changed. This may be from scar from the prior biopsy. I do not palpate a central mass. There are no skin or nipple changes  of concern. The left axilla is benign.  LAB RESULTS:  CMP     Component Value Date/Time   NA 142 06/14/2013 0756   NA 140 12/29/2012 0922   K 4.3 06/14/2013 0756   K 4.2 12/29/2012 0922   CL 105 12/29/2012 0922   CO2 22 06/14/2013 0756   CO2 27 12/29/2012 0922   GLUCOSE 79 06/14/2013 0756   GLUCOSE 95 12/29/2012 0922   GLUCOSE 88 01/13/2006 1012   BUN 22.0 06/14/2013 0756   BUN 20 12/29/2012 0922   CREATININE 0.8 06/14/2013 0756   CREATININE 0.9 12/29/2012 0922   CALCIUM 9.1 06/14/2013 0756   CALCIUM 9.3 12/29/2012 0922   PROT 6.5 06/14/2013 0756   PROT 6.9 12/29/2012 0922   ALBUMIN 3.7 06/14/2013 0756   ALBUMIN 4.1 12/29/2012 0922   AST 18 06/14/2013 0756   AST 23 12/29/2012 0922   ALT 14 06/14/2013 0756   ALT 20 12/29/2012 0922   ALKPHOS 79 06/14/2013 0756   ALKPHOS 77 12/29/2012 0922   BILITOT 0.45 06/14/2013 0756   BILITOT 0.8 12/29/2012 0922   GFRNONAA 71.43 11/28/2009 1015   GFRAA 81 05/20/2007 1015    I No results found for this basename: SPEP, UPEP,  kappa and lambda light chains    Lab Results  Component Value Date   WBC 5.9 06/14/2013   NEUTROABS 4.0  06/14/2013   HGB 14.1 06/14/2013   HCT 41.5 06/14/2013   MCV 91.4 06/14/2013   PLT 269 06/14/2013      Chemistry      Component Value Date/Time   NA 142 06/14/2013 0756   NA 140 12/29/2012 0922   K 4.3 06/14/2013 0756   K 4.2 12/29/2012 0922   CL 105 12/29/2012 0922   CO2 22 06/14/2013 0756   CO2 27 12/29/2012 0922   BUN 22.0 06/14/2013 0756   BUN 20 12/29/2012 0922   CREATININE 0.8 06/14/2013 0756   CREATININE 0.9 12/29/2012 0922      Component Value Date/Time   CALCIUM 9.1 06/14/2013 0756   CALCIUM 9.3 12/29/2012 0922   ALKPHOS 79 06/14/2013 0756   ALKPHOS 77 12/29/2012 0922   AST 18 06/14/2013 0756   AST 23 12/29/2012 0922   ALT 14 06/14/2013 0756   ALT 20 12/29/2012 0922   BILITOT 0.45 06/14/2013 0756   BILITOT 0.8 12/29/2012 0922       No results found for this basename: LABCA2    No components found with this basename: LABCA125    No results found for this basename: INR,  in the last 168 hours  Urinalysis    Component Value Date/Time   COLORURINE yellow 05/20/2007 0942   APPEARANCEUR Clear 05/20/2007 0942   LABSPEC <1.005 05/20/2007 0942   PHURINE 7.5 05/20/2007 0942   HGBUR negative 05/20/2007 0942   BILIRUBINUR negative 05/20/2007 0942   UROBILINOGEN 0.2 05/20/2007 0942   NITRITE negative 05/20/2007 0942    STUDIES: Mr Breast Bilateral W Wo Contrast  06/13/2013   CLINICAL DATA:  73 year old female with recently diagnosed invasive mammary carcinoma of the left breast at 12 o'clock (at palpable mass at site of distortion).  LABS:  BUN and creatinine were obtained on site at [place] at  [address]  Results:  BUN 21 mg/dL,  Creatinine 0 point No mg/dL.  EXAM: BILATERAL BREAST MRI WITH AND WITHOUT CONTRAST  TECHNIQUE: Multiplanar, multisequence MR images of both breasts were obtained prior to and following the intravenous administration of 93m of  MultiHance.  THREE-DIMENSIONAL MR IMAGE RENDERING ON INDEPENDENT WORKSTATION:  Three-dimensional MR images  were rendered by post-processing of the original MR data on an independent workstation. The three-dimensional MR images were interpreted, and findings are reported in the following complete MRI report for this study. Three dimensional images were evaluated at the independent DynaCad workstation  COMPARISON:  Previous exams.  FINDINGS: Breast composition: c:  Heterogeneous fibroglandular tissue  Background parenchymal enhancement: Minimal.  Right breast: No suspicious rapidly enhancing masses or abnormal areas of enhancement in the right breast to suggest malignancy.  Left breast: There is an irregular enhancing mass/ area of distortion in the superior left breast at the 12 o'clock position measuring approximately 1.9 cm AP, 2.8 cm transverse, and approximately 1.6 cm craniocaudal. The biopsy marking clip is present along the lateral margin of the mass. No additional suspicious enhancing masses or abnormal areas of enhancement are seen in the left breast to suggest additional sites of disease.  Lymph nodes: No morphologically abnormal axillary lymph nodes. No internal mammary lymphadenopathy.  Ancillary findings:  None.  IMPRESSION: 1. Biopsy proven malignancy in the superior left breast at 12 o'clock measuring up to 2.8 cm transverse. No findings to suggest multifocal or multicentric disease.  2. No MRI evidence of malignancy in the right breast.  RECOMMENDATION: Treatment plan for known left breast malignancy.  BI-RADS CATEGORY  6: Known biopsy-proven malignancy - appropriate action should be taken.   Electronically Signed   By: Everlean Alstrom M.D.   On: 06/13/2013 10:26    ASSESSMENT: 54 y.o. [place] woman status post left breast biopsy for 08/19/2013 for a clinical T2 N0, stage IIA invasive lobular breast cancer, estrogen receptor positive, progesterone receptor negative, with an MIB-1 of 20%. There was not sufficient tissue for HER-2 testing.  PLAN: We spent the better part of today's hour-long  appointment discussing the biology of breast cancer in general, and the specifics of the patient's tumor in particular. Eda Keys understands she has a lobular breast cancer, and that these can be difficult to detect both by radiology and by palpation. We do not have enough tissue for HER-2 determination. While breast conserving surgery at this point is feasible, our feeling is that she would benefit from neoadjuvant treatment to shrink the tumor if possible and make the surgery is medically more acceptable.  She understands that starting with systemic treatment does not affect survival. We then discussed anti-estrogens and since she had a normal bone density 3 years ago we will start with anastrozole. We discussed the possible toxicities, side effects and complications of this agent. We generally do not obtained as brisk a response with the progesterone receptor is negative as well and it is also positive, but we will repeat a breast MRI after she has been under treatment for 3 months. If we are not seeing a significant response we will consider proceeding to surgery at that time.  We are not making a decision regarding chemotherapy at this point. For one thing we would need be sure to information. For another we would want to send an Oncotype from her definitive surgical specimen to help [name] make that decision.  Eda Keys has a good understanding of the overall plan. She agrees with it. She knows a goal of treatment in her cases cure. She will call with any problems that may develop before her next visit here.   Chauncey Cruel, MD   06/14/2013 9:45 AM

## 2013-06-14 NOTE — Progress Notes (Signed)
Chief complaint:  New left breast cancer  HISTORY: Patient is a 73 year old female who has a new diagnosis of left breast cancer.  She presented with screening detected area of distortion. She has no family history of cancer. On her followup films, it was noted that she did have a palpable mass corresponding to the area of abnormality. There was 2 cm on ultrasound. She subsequently underwent MRI and was found to have a 2.8 cm area at 12:00 in the central breast. She on biopsies was found to have a probable lobular cancer that was ER positive, PR negative, and there was inadequate tissue for her 2. Ki-67 was 20%. She has not palpated a mass. She has been up-to-date with her mammograms. She had a benign breast cyst on the inside required surgery. She is a former smoker. She drinks alcohol occasionally. She had menarche at age 59. She is no longer having periods. She took hormone replacement for a year. She had 2 children with the first at age 58. She has not received oral or other hormonal contraceptives. She has had a colonoscopy and bone density study. She's up-to-date on her Pap smear.  Past Medical History  Diagnosis Date  . Cough variant asthma   . Osteopenia   . Stricture and stenosis of cervix   . Postmenopausal   . GERD (gastroesophageal reflux disease)   . Disease of pharynx or nasopharynx   . Elevated BP   . Neoplasm   . Personal history of malignant neoplasm of bronchus and lung   . Allergic rhinitis, cause unspecified   . Diverticulosis   . Internal hemorrhoids   . Hiatal hernia   . Hypertension   . Laryngospasm   . 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 Hyperlipidemia   . Glaucoma   . Laryngospasm   . Multinodular goiter     Past Surgical History  Procedure Laterality Date  . Lung cancer surgery      resection carcinoid lingula  . Breast biopsy    . Lung surgery      carcinoid tumor removed    Current Outpatient Prescriptions  Medication Sig Dispense  Refill  . albuterol (VENTOLIN HFA) 108 (90 BASE) MCG/ACT inhaler Inhale 2 puffs into the lungs every 4 (four) hours as needed.  1 Inhaler  3  . Aspirin (ADULT ASPIRIN LOW STRENGTH) 81 MG EC tablet Take 81 mg by mouth daily.        . bimatoprost (LUMIGAN) 0.03 % ophthalmic drops 1 drop at bedtime.        . chlorpheniramine (CHLOR-TRIMETON) 4 MG tablet Take 4 mg by mouth. One every 6 hours as needed for nasal drainage       . DIOVAN 40 MG tablet TAKE 1 TABLET DAILY  90 tablet  1  . fluticasone-salmeterol (ADVAIR HFA) 230-21 MCG/ACT inhaler Inhale 2 puffs into the lungs 2 (two) times daily.  3 Inhaler  3  . gabapentin (NEURONTIN) 400 MG capsule TAKE 1 CAPSULE TWO TIMES DAILY  180 capsule  1  . loratadine (CLARITIN) 10 MG tablet Take 1 tablet (10 mg total) by mouth daily.  30 tablet  0  . Multiple Vitamins-Iron (MULTIVITAMIN/IRON) TABS Take by mouth daily.        Marland Kitchen omeprazole (PRILOSEC) 20 MG capsule TAKE 1 CAPSULE DAILY 30 MINUTES BEFORE A MEAL  90 capsule  1  . simvastatin (ZOCOR) 40 MG tablet TAKE 1 TABLET AT BEDTIME  90 tablet  0  . traMADol (ULTRAM) 50 MG tablet TAKE 1 TABLET EVERY 6 HOURS AS NEEDED FOR PAIN  45 tablet  0   No current facility-administered medications for this visit.     Allergies  Allergen Reactions  . Combigan [Brimonidine Tartrate-Timolol]     Eyes itch, reddened  . Sulfonamide Derivatives      Family History  Problem Relation Age of Onset  . Stroke Mother   . Hypertension Mother   . Hyperlipidemia Mother   . Stroke Father   . Transient ischemic attack Father   . Hyperlipidemia Father   . Hypertension Father      History   Social History  . Marital Status: Married    Spouse Name: N/A    Number of Children: N/A  . Years of Education: N/A   Social History Main Topics  . Smoking status: Former Smoker    Quit date: 06/15/1963  . Smokeless tobacco: Not on file  . Alcohol Use: Yes     Comment: occasional wine  . Drug Use: No  . Sexual Activity: Not  on file   Other Topics Concern  . Not on file   Social History Narrative  . No narrative on file     REVIEW OF SYSTEMS - PERTINENT POSITIVES ONLY: 12 point review of systems negative other than HPI and PMH except for thumb arthritis.  Thyroid nodules.    EXAM: Wt Readings from Last 3 Encounters:  06/14/13 145 lb 4.8 oz (65.908 kg)  01/11/13 143 lb 8 oz (65.091 kg)  12/29/12 144 lb (65.318 kg)   Temp Readings from Last 3 Encounters:  06/14/13 97.9 F (36.6 C) Oral  01/11/13 97.7 F (36.5 C) Oral  12/29/12 98.1 F (36.7 C) Oral   BP Readings from Last 3 Encounters:  06/14/13 164/99  01/11/13 130/80  12/29/12 124/74   Pulse Readings from Last 3 Encounters:  06/14/13 59  01/11/13 80  12/29/12 72     Wt Readings from Last 3 Encounters:  06/14/13 145 lb 4.8 oz (65.908 kg)  01/11/13 143 lb 8 oz (65.091 kg)  12/29/12 144 lb (65.318 kg)     Gen:  No acute distress.  Well nourished and well groomed.   Neurological: Alert and oriented to person, place, and time. Coordination normal.  Head: Normocephalic and atraumatic.  Eyes: Conjunctivae are normal. Pupils are equal, round, and reactive to light. No scleral icterus.  Neck: Normal range of motion. Neck supple. No tracheal deviation or thyromegaly present.  Cardiovascular: Normal rate, regular rhythm, normal heart sounds and intact distal pulses.  Exam reveals no gallop and no friction rub.  No murmur heard. Breast: Palpable distortion at 12 o'clock.  No lymphadenopathy, no nipple discharge.  No skin dimpling.   Respiratory: Effort normal.  No respiratory distress. No chest wall tenderness. Breath sounds normal.  No wheezes, rales or rhonchi.  GI: Soft. Bowel sounds are normal. The abdomen is soft and nontender.  There is no rebound and no guarding.  Musculoskeletal: Normal range of motion. Extremities are nontender.  Lymphadenopathy: No cervical, preauricular, postauricular or axillary adenopathy is present Skin: Skin is  warm and dry. No rash noted. No diaphoresis. No erythema. No pallor. No clubbing, cyanosis, or edema.   Psychiatric: Normal mood and affect. Behavior is normal. Judgment and thought content normal.    LABORATORY RESULTS: Available labs are reviewed   Recent Results (from the past 2160 hour(s))  HM MAMMOGRAPHY     Status: None   Collection  Time    05/26/13 12:00 AM      Result Value Ref Range   HM Mammogram [name] recommended, [facility]    HM MAMMOGRAPHY     Status: None   Collection Time    06/02/13 12:00 AM      Result Value Ref Range   HM Mammogram 2cm mass, [name] recommended, [facility]    CBC WITH DIFFERENTIAL     Status: None   Collection Time    06/14/13  7:56 AM      Result Value Ref Range   WBC 5.9  3.9 - 10.3 10e3/uL   NEUT# 4.0  1.5 - 6.5 10e3/uL   HGB 14.1  11.6 - 15.9 g/dL   HCT 41.5  34.8 - 46.6 %   Platelets 269  145 - 400 10e3/uL   MCV 91.4  79.5 - 101.0 fL   MCH 31.0  25.1 - 34.0 pg   MCHC 33.9  31.5 - 36.0 g/dL   RBC 4.55  3.70 - 5.45 10e6/uL   RDW 13.1  11.2 - 14.5 %   lymph# 1.4  0.9 - 3.3 10e3/uL   MONO# 0.5  0.1 - 0.9 10e3/uL   Eosinophils Absolute 0.0  0.0 - 0.5 10e3/uL   Basophils Absolute 0.0  0.0 - 0.1 10e3/uL   NEUT% 68.1  38.4 - 76.8 %   LYMPH% 23.3  14.0 - 49.7 %   MONO% 8.1  0.0 - 14.0 %   EOS% 0.1  0.0 - 7.0 %   BASO% 0.4  0.0 - 2.0 %  COMPREHENSIVE METABOLIC PANEL (IF02)     Status: Abnormal   Collection Time    06/14/13  7:56 AM      Result Value Ref Range   Sodium 142  136 - 145 mEq/L   Potassium 4.3  3.5 - 5.1 mEq/L   Chloride 112 (*) 98 - 109 mEq/L   CO2 22  22 - 29 mEq/L   Glucose 79  70 - 140 mg/dl   BUN 22.0  7.0 - 26.0 mg/dL   Creatinine 0.8  0.6 - 1.1 mg/dL   Total Bilirubin 0.45  0.20 - 1.20 mg/dL   Alkaline Phosphatase 79  40 - 150 U/L   AST 18  5 - 34 U/L   ALT 14  0 - 55 U/L   Total Protein 6.5  6.4 - 8.3 g/dL   Albumin 3.7  3.5 - 5.0 g/dL   Calcium 9.1  8.4 - 10.4 mg/dL   Anion Gap 8  3 - 11 mEq/L     RADIOLOGY RESULTS: See  E-Chart or I-Site for most recent results.  Images and reports are reviewed.  Mammogram  2 cm irregular mass at 12 o'clock deep to nipple.  Corresponds with palpable mass.    Mr Breast Bilateral W Wo Contrast  06/13/2013   CLINICAL DATA:  73 year old female with recently diagnosed invasive mammary carcinoma of the left breast at 12 o'clock (at palpable mass at site of distortion).  LABS:  BUN and creatinine were obtained on site at [facility] at  [address]  Results:  BUN 21 mg/dL,  Creatinine 0 point No mg/dL.  EXAM: BILATERAL BREAST MRI WITH AND WITHOUT CONTRAST  TECHNIQUE: Multiplanar, multisequence MR images of both breasts were obtained prior to and following the intravenous administration of 20m of MultiHance.  THREE-DIMENSIONAL MR IMAGE RENDERING ON INDEPENDENT WORKSTATION:  Three-dimensional MR images were rendered by post-processing of the original MR data on an independent  workstation. The three-dimensional MR images were interpreted, and findings are reported in the following complete MRI report for this study. Three dimensional images were evaluated at the independent DynaCad workstation  COMPARISON:  Previous exams.  FINDINGS: Breast composition: c:  Heterogeneous fibroglandular tissue  Background parenchymal enhancement: Minimal.  Right breast: No suspicious rapidly enhancing masses or abnormal areas of enhancement in the right breast to suggest malignancy.  Left breast: There is an irregular enhancing mass/ area of distortion in the superior left breast at the 12 o'clock position measuring approximately 1.9 cm AP, 2.8 cm transverse, and approximately 1.6 cm craniocaudal. The biopsy marking clip is present along the lateral margin of the mass. No additional suspicious enhancing masses or abnormal areas of enhancement are seen in the left breast to suggest additional sites of disease.  Lymph nodes: No morphologically abnormal axillary lymph nodes. No internal mammary  lymphadenopathy.  Ancillary findings:  None.  IMPRESSION: 1. Biopsy proven malignancy in the superior left breast at 12 o'clock measuring up to 2.8 cm transverse. No findings to suggest multifocal or multicentric disease.  2. No MRI evidence of malignancy in the right breast.  RECOMMENDATION: Treatment plan for known left breast malignancy.  BI-RADS CATEGORY  6: Known biopsy-proven malignancy - appropriate action should be taken.   Electronically Signed   By: Everlean Alstrom M.D.   On: 06/13/2013 10:26      ASSESSMENT AND PLAN: Breast cancer of upper-outer quadrant of left female breast Patient is to undergo a short course of neoadjuvant hormones.  If she does have some significant response, we would continue for a short period of time longer. I am concerned that if we went straight for lumpectomy, that we would have difficulty getting a good margin with a good cosmetic result.  She will need a lumpectomy with sentinel lymph node by biopsy versus a mastectomy with sentinel node biopsy.  She is going to receive anastrozole. I will see her back at the midpoint of treatment.    We briefly discussed what surgery would entail. I advised her to write down any questions that she comes up with in order for [name] to answer them at her next clinic visit. She should not need any cardiac clearance prior to surgery. She is not on any blood thinners other than baby aspirin.    45 min spent with evaluation, examination, counseling, and coordination of care.    >50% spent in counseling.       Milus Height MD Surgical Oncology, General and [facility]      Visit Diagnoses: 1. Breast cancer of upper-outer quadrant of left female breast     Primary Care Physician: Annye Asa, MD  [facility]

## 2013-06-19 ENCOUNTER — Encounter: Payer: Self-pay | Admitting: *Deleted

## 2013-06-19 ENCOUNTER — Other Ambulatory Visit: Payer: Self-pay | Admitting: Family Medicine

## 2013-06-19 NOTE — Progress Notes (Signed)
[employer] Psychosocial Distress Screening Clinical Social Work  Patient completed distress screening protocol, and scored a 4 on the Psychosocial Distress Thermometer which indicates low distress. Clinical Social Worker met with pt in [facility] to assess for distress and other psychosocial needs.  Pt stated she was doing well,  and felt comfortable after meeting with the medical team and getting more information on her treatment plan. CSW informed pt of the support team and support services at [facility].  Pt was appreciative of support information, and describe a strong support system in her family and friends.  CSW provided contact information and encouraged pt to call with any needs or concerns.    Johnnye Lana, MSW, [place] Worker [facility] [phone]

## 2013-06-19 NOTE — Telephone Encounter (Signed)
Rx sent to the pharmacy by e-script.//AB/CMA 

## 2013-06-20 ENCOUNTER — Telehealth: Payer: Self-pay | Admitting: *Deleted

## 2013-06-20 NOTE — Telephone Encounter (Signed)
Left message for a return phone call from [facility] 06/14/13.  Awaiting patient response.

## 2013-06-23 ENCOUNTER — Encounter: Payer: Self-pay | Admitting: Family Medicine

## 2013-07-03 ENCOUNTER — Encounter: Payer: Self-pay | Admitting: Family Medicine

## 2013-07-08 ENCOUNTER — Other Ambulatory Visit: Payer: Self-pay | Admitting: Family Medicine

## 2013-07-10 NOTE — Telephone Encounter (Signed)
Med filled.  

## 2013-07-12 ENCOUNTER — Other Ambulatory Visit: Payer: Self-pay | Admitting: Family Medicine

## 2013-07-12 DIAGNOSIS — M899 Disorder of bone, unspecified: Secondary | ICD-10-CM | POA: Diagnosis not present

## 2013-07-12 DIAGNOSIS — Z1231 Encounter for screening mammogram for malignant neoplasm of breast: Secondary | ICD-10-CM | POA: Diagnosis not present

## 2013-07-12 LAB — HM DEXA SCAN

## 2013-07-12 NOTE — Telephone Encounter (Signed)
Med filled.  

## 2013-07-19 ENCOUNTER — Other Ambulatory Visit: Payer: Self-pay | Admitting: Family Medicine

## 2013-07-20 NOTE — Telephone Encounter (Signed)
Med filled and faxed.  

## 2013-07-20 NOTE — Telephone Encounter (Signed)
Last OV 12-29-12 Last refill 05-29-13 #45 with 0  Low risk, Due for UDS

## 2013-07-26 ENCOUNTER — Encounter: Payer: Self-pay | Admitting: General Practice

## 2013-08-02 ENCOUNTER — Ambulatory Visit (HOSPITAL_BASED_OUTPATIENT_CLINIC_OR_DEPARTMENT_OTHER): Payer: Medicare Other | Admitting: Oncology

## 2013-08-02 VITALS — BP 155/84 | HR 80 | Temp 98.1°F | Resp 18 | Ht <= 58 in | Wt 143.5 lb

## 2013-08-02 DIAGNOSIS — C50919 Malignant neoplasm of unspecified site of unspecified female breast: Secondary | ICD-10-CM

## 2013-08-02 DIAGNOSIS — C50412 Malignant neoplasm of upper-outer quadrant of left female breast: Secondary | ICD-10-CM

## 2013-08-02 DIAGNOSIS — M899 Disorder of bone, unspecified: Secondary | ICD-10-CM

## 2013-08-02 DIAGNOSIS — M949 Disorder of cartilage, unspecified: Secondary | ICD-10-CM | POA: Diagnosis not present

## 2013-08-02 DIAGNOSIS — Z170 Estrogen receptor positive status [ER+]: Secondary | ICD-10-CM

## 2013-08-02 MED ORDER — TAMOXIFEN CITRATE 20 MG PO TABS: 20.0000 mg | ORAL_TABLET | Freq: Every day | ORAL | Status: DC

## 2013-08-02 NOTE — Addendum Note (Signed)
Addended by: Chauncey Cruel on: 08/02/2013 04:54 PM   Modules accepted: Orders

## 2013-08-02 NOTE — Progress Notes (Signed)
[facility]  [phone] [phone]     ID: [name] OB: October 08, 1940  MR#: [id number]  KHT#:[id number]  PCP: Annye Asa, MD GYN:   SU: Stark Klein OTHER MD: Thea Silversmith, Charlena Cross   CHIEF COMPLAINT: left breast cnacer, clinical stage II TREATMENT: neoadjuvant hormone therapy  BREAST CANCER HISTORY: From the initial intake note 06/14/2013:  [name] had screening mammography with tomography at [facility] 05/26/2013 showing some architectural distortion in the left breast superiorly. Ultrasound of the left breast 06/02/2013 showed a 2 cm irregular mass superiorly in the left breast, medial to the scar on the skin from a prior biopsy. This mass was palpable. 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  On 06/13/2013 the patient underwent bilateral breast MRI. This showed a 2.8 cm irregular enhancing mass in the superior left breast but no additional suspicious masses, and no abnormal adenopathy, and no findings in the right breast.  The patient's subsequent history is as detailed below.  INTERVAL HISTORY: Kalb returns today for followup of her early stage breast cancer. She was started on neoadjuvant anastrozole mid April. She is not tolerating this well. She feels tired, and has significant discomfort not only in her hands but also her wrists and forearms. "It's just not me". In addition we obtained a bone density 07/12/2013 which shows osteopenia with a T score of -1.8 at the femoral neck.  REVIEW OF SYSTEMS: Aside from the fatigue and arthralgias/myalgias, that he has a mild cough which she attributes to seasonal allergies. She is having slightly more hot flashes than before, but this was not a major issue nor was  vaginal dryness. A detailed review of systems today was otherwise noncontributory  PAST MEDICAL HISTORY: Past Medical History  Diagnosis Date  . Cough variant asthma   . Osteopenia   . Stricture and stenosis of cervix   . Postmenopausal   . GERD (gastroesophageal reflux disease)   . Disease of pharynx or nasopharynx   . Elevated BP   . Neoplasm   . Personal history of malignant neoplasm of bronchus and lung   . Allergic rhinitis, cause unspecified   . Diverticulosis   . Internal hemorrhoids   . Hiatal hernia   . Hypertension   . Laryngospasm   . 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 Hyperlipidemia   . Glaucoma   . Laryngospasm   . Multinodular goiter     PAST SURGICAL HISTORY: Past Surgical History  Procedure Laterality Date  . Lung cancer surgery      resection carcinoid lingula  . Breast biopsy    . Lung surgery      carcinoid tumor removed    FAMILY HISTORY Family History  Problem Relation Age of Onset  . Stroke Mother   . Hypertension Mother   . Hyperlipidemia Mother   . Stroke Father   . Transient ischemic attack Father   . Hyperlipidemia Father   . Hypertension Father    the patient's father died from pneumonia the age of 93, in the setting of dementia. The patient's mother died at the age of 10 following a stroke. The patient had one brother, 4 sisters. There is no history of breast or ovarian cancer in the family  GYNECOLOGIC HISTORY:  Menarche age 73, first  live birth age 64, she is GX P2. She did not use hormone replacement at menopause. She never took birth control pills.   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 She does a little bit of swelling on the side. Her husband [name] is retired from the [employer] and then worked for her [employer]. He is an avid Animator. Son [name] lives in [place] and is an Art gallery manager. Daughter [name] lives in [place] and is a Astronomer.  The patient has 5 grandchildren. She attends the [employer]    ADVANCED DIRECTIVES: In place   HEALTH MAINTENANCE: History  Substance Use Topics  . Smoking status: Former Smoker    Quit date: 06/15/1963  . Smokeless tobacco: Not on file  . Alcohol Use: Yes     Comment: occasional wine     Colonoscopy:  PAP:  Bone density: 3 07/12/2013 [facility], T score of -1.8 at the femoral neck  Lipid panel:  Allergies  Allergen Reactions  . Combigan [Brimonidine Tartrate-Timolol]     Eyes itch, reddened  . Sulfonamide Derivatives     Current Outpatient Prescriptions  Medication Sig Dispense Refill  . albuterol (VENTOLIN HFA) 108 (90 BASE) MCG/ACT inhaler Inhale 2 puffs into the lungs every 4 (four) hours as needed.  1 Inhaler  3  . anastrozole (ARIMIDEX) 1 MG tablet Take 1 tablet (1 mg total) by mouth daily.  90 tablet  4  . Aspirin (ADULT ASPIRIN LOW STRENGTH) 81 MG EC tablet Take 81 mg by mouth daily.        . bimatoprost (LUMIGAN) 0.03 % ophthalmic drops 1 drop at bedtime.        . chlorpheniramine (CHLOR-TRIMETON) 4 MG tablet Take 4 mg by mouth. One every 6 hours as needed for nasal drainage       . DIOVAN 40 MG tablet TAKE 1 TABLET DAILY  90 tablet  1  . fluticasone-salmeterol (ADVAIR HFA) 230-21 MCG/ACT inhaler Inhale 2 puffs into the lungs 2 (two) times daily.  3 Inhaler  3  . gabapentin (NEURONTIN) 400 MG capsule TAKE 1 CAPSULE TWICE A DAY  180 capsule  0  . loratadine (CLARITIN) 10 MG tablet Take 1 tablet (10 mg total) by mouth daily.  30 tablet  0  . Multiple Vitamins-Iron (MULTIVITAMIN/IRON) TABS Take by mouth daily.        Marland Kitchen omeprazole (PRILOSEC) 20 MG capsule TAKE 1 CAPSULE DAILY 30 MINUTES BEFORE A MEAL  90 capsule  1  . simvastatin (ZOCOR) 40 MG tablet TAKE 1 TABLET AT BEDTIME  90 tablet  0  . traMADol (ULTRAM) 50 MG tablet TAKE 1 TABLET BY MOUTH EVERY 6 HOURS AS NEEDED FOR PAIN  45 tablet  0   No current facility-administered medications for this visit.    OBJECTIVE:  Middle-aged white woman who appears stated age 22 Vitals:   08/02/13 1623  BP: 155/84  Pulse: 80  Temp: 98.1 F (36.7 C)  Resp: 18     Body mass index is 30 kg/(m^2).    ECOG FS:1 - Symptomatic but completely ambulatory  Ocular: Sclerae unicteric, EOMs intact Ear-nose-throat: Oropharynx clear and moist Lymphatic: No cervical or supraclavicular adenopathy Lungs no rales or rhonchi Heart sounds regular today), no murmur appreciated Abd soft, nontender, positive bowel sounds MSK no focal spinal tenderness, no joint edema or erythema Neuro: non-focal, well-oriented, appropriate affect Breasts: Deferred  LAB RESULTS:  CMP     Component Value Date/Time  NA 142 06/14/2013 0756   NA 140 12/29/2012 0922   K 4.3 06/14/2013 0756   K 4.2 12/29/2012 0922   CL 105 12/29/2012 0922   CO2 22 06/14/2013 0756   CO2 27 12/29/2012 0922   GLUCOSE 79 06/14/2013 0756   GLUCOSE 95 12/29/2012 0922   GLUCOSE 88 01/13/2006 1012   BUN 22.0 06/14/2013 0756   BUN 20 12/29/2012 0922   CREATININE 0.8 06/14/2013 0756   CREATININE 0.9 12/29/2012 0922   CALCIUM 9.1 06/14/2013 0756   CALCIUM 9.3 12/29/2012 0922   PROT 6.5 06/14/2013 0756   PROT 6.9 12/29/2012 0922   ALBUMIN 3.7 06/14/2013 0756   ALBUMIN 4.1 12/29/2012 0922   AST 18 06/14/2013 0756   AST 23 12/29/2012 0922   ALT 14 06/14/2013 0756   ALT 20 12/29/2012 0922   ALKPHOS 79 06/14/2013 0756   ALKPHOS 77 12/29/2012 0922   BILITOT 0.45 06/14/2013 0756   BILITOT 0.8 12/29/2012 0922   GFRNONAA 71.43 11/28/2009 1015   GFRAA 81 05/20/2007 1015    I No results found for this basename: SPEP,  UPEP,   kappa and lambda light chains    Lab Results  Component Value Date   WBC 5.9 06/14/2013   NEUTROABS 4.0 06/14/2013   HGB 14.1 06/14/2013   HCT 41.5 06/14/2013   MCV 91.4 06/14/2013   PLT 269 06/14/2013      Chemistry      Component Value Date/Time   NA 142 06/14/2013 0756   NA 140 12/29/2012 0922   K 4.3 06/14/2013 0756   K 4.2 12/29/2012 0922    CL 105 12/29/2012 0922   CO2 22 06/14/2013 0756   CO2 27 12/29/2012 0922   BUN 22.0 06/14/2013 0756   BUN 20 12/29/2012 0922   CREATININE 0.8 06/14/2013 0756   CREATININE 0.9 12/29/2012 0922      Component Value Date/Time   CALCIUM 9.1 06/14/2013 0756   CALCIUM 9.3 12/29/2012 0922   ALKPHOS 79 06/14/2013 0756   ALKPHOS 77 12/29/2012 0922   AST 18 06/14/2013 0756   AST 23 12/29/2012 0922   ALT 14 06/14/2013 0756   ALT 20 12/29/2012 0922   BILITOT 0.45 06/14/2013 0756   BILITOT 0.8 12/29/2012 0922       No results found for this basename: LABCA2    No components found with this basename: LABCA125    No results found for this basename: INR,  in the last 168 hours  Urinalysis    Component Value Date/Time   COLORURINE yellow 05/20/2007 0942   APPEARANCEUR Clear 05/20/2007 0942   LABSPEC <1.005 05/20/2007 0942   PHURINE 7.5 05/20/2007 0942   HGBUR negative 05/20/2007 0942   BILIRUBINUR negative 05/20/2007 0942   UROBILINOGEN 0.2 05/20/2007 0942   NITRITE negative 05/20/2007 0942    STUDIES: No results found.    ASSESSMENT: 73 y.o. [place] woman status post left breast biopsy for 08/19/2013 for a clinical T2 N0, stage IIA invasive lobular breast cancer, estrogen receptor positive, progesterone receptor negative, with an MIB-1 of 20%. There was not sufficient tissue for HER-2 testing.  (1) neoadjuvant anastrozole started mid April 2015, discontinued early June 2015 with poor tolerance  (2) tamoxifen to be started first week in July  (3) osteopenia with a T score of -1.8 at the femoral necks 07/12/2013  PLAN: Clearly anastrozole is not going to be the drug for [facility]. I have asked her to stop it and I expect she will start feeling better  after 2-3 weeks.  We do want a "washout period" before she starts tamoxifen, since we have evidence of these 2 drugs "work against each other" from the ATAC trial  Today we went over the possible toxicities, side effects and complications of  tamoxifen, including the risk of blood clots and endometrial carcinoma. We also discussed the possible benefits. She is going to start the tamoxifen 09/04/2013 and return to see me early October. Before that visit we will repeat a breast MRI.  She has a good understanding of the overall plan, and agrees with it. She knows a goal of treatment in her cases cure.Velta Addison will call with any problems that may develop before her next visit here   Chauncey Cruel, MD   08/02/2013 4:41 PM

## 2013-08-04 ENCOUNTER — Telehealth: Payer: Self-pay | Admitting: Oncology

## 2013-08-04 NOTE — Telephone Encounter (Signed)
pt cld to chge her appt to 10/15-chgd @ gave pt appt time & date

## 2013-09-05 ENCOUNTER — Other Ambulatory Visit: Payer: Self-pay | Admitting: Family Medicine

## 2013-09-05 NOTE — Telephone Encounter (Signed)
Med filled and faxed.  

## 2013-09-05 NOTE — Telephone Encounter (Signed)
Last OV 12-29-12 Med last filled 05-29-13 #45 with 0

## 2013-09-05 NOTE — Telephone Encounter (Signed)
Left message on home phone making patient aware that her prescription has been faxed.

## 2013-09-15 DIAGNOSIS — H4011X0 Primary open-angle glaucoma, stage unspecified: Secondary | ICD-10-CM | POA: Diagnosis not present

## 2013-09-15 DIAGNOSIS — H409 Unspecified glaucoma: Secondary | ICD-10-CM | POA: Diagnosis not present

## 2013-09-20 ENCOUNTER — Other Ambulatory Visit: Payer: Self-pay | Admitting: Family Medicine

## 2013-09-20 NOTE — Telephone Encounter (Signed)
Med filled pt due for CPE in October.

## 2013-09-21 ENCOUNTER — Other Ambulatory Visit: Payer: Self-pay | Admitting: *Deleted

## 2013-09-21 MED ORDER — TAMOXIFEN CITRATE 20 MG PO TABS: 20.0000 mg | ORAL_TABLET | Freq: Every day | ORAL | Status: DC

## 2013-09-22 ENCOUNTER — Other Ambulatory Visit: Payer: Self-pay | Admitting: Family Medicine

## 2013-09-22 NOTE — Telephone Encounter (Signed)
Med filled and letter mailed.

## 2013-09-24 ENCOUNTER — Other Ambulatory Visit: Payer: Self-pay | Admitting: Family Medicine

## 2013-09-25 NOTE — Telephone Encounter (Signed)
Med filled.  

## 2013-10-19 ENCOUNTER — Other Ambulatory Visit: Payer: Self-pay | Admitting: Family Medicine

## 2013-10-19 NOTE — Telephone Encounter (Signed)
Last OV 12-29-12 Tramadol filled 09-05-13 #45 with 0  No upcoming appts,

## 2013-10-19 NOTE — Telephone Encounter (Signed)
Med filled and faxed.  

## 2013-11-10 ENCOUNTER — Other Ambulatory Visit: Payer: Self-pay | Admitting: Family Medicine

## 2013-11-10 NOTE — Telephone Encounter (Signed)
Med filled.  

## 2013-11-29 ENCOUNTER — Ambulatory Visit
Admission: RE | Admit: 2013-11-29 | Discharge: 2013-11-29 | Disposition: A | Discharge status: Home / Self Care | Payer: Medicare Other | Source: Ambulatory Visit | Attending: Oncology | Admitting: Oncology

## 2013-11-29 DIAGNOSIS — C50412 Malignant neoplasm of upper-outer quadrant of left female breast: Secondary | ICD-10-CM

## 2013-11-29 DIAGNOSIS — C50919 Malignant neoplasm of unspecified site of unspecified female breast: Secondary | ICD-10-CM | POA: Diagnosis not present

## 2013-11-29 MED ORDER — GADOBENATE DIMEGLUMINE 529 MG/ML IV SOLN
13.0000 mL | Freq: Once | INTRAVENOUS | Status: AC | PRN
  Administered 2013-11-29: 13 mL via INTRAVENOUS

## 2013-12-01 ENCOUNTER — Other Ambulatory Visit: Payer: Self-pay | Admitting: Family Medicine

## 2013-12-01 NOTE — Telephone Encounter (Signed)
Last OV:  12/29/12 CPE scheduled for 02/08/14

## 2013-12-01 NOTE — Telephone Encounter (Signed)
Refill until cpe

## 2013-12-06 ENCOUNTER — Other Ambulatory Visit: Payer: Self-pay | Admitting: Family Medicine

## 2013-12-06 NOTE — Telephone Encounter (Signed)
Med filled.  

## 2013-12-13 ENCOUNTER — Ambulatory Visit: Payer: Medicare Other | Admitting: Oncology

## 2013-12-14 ENCOUNTER — Ambulatory Visit (HOSPITAL_BASED_OUTPATIENT_CLINIC_OR_DEPARTMENT_OTHER): Payer: Medicare Other | Admitting: Oncology

## 2013-12-14 ENCOUNTER — Telehealth: Payer: Self-pay | Admitting: General Practice

## 2013-12-14 ENCOUNTER — Telehealth: Payer: Self-pay | Admitting: Oncology

## 2013-12-14 VITALS — BP 144/90 | HR 75 | Temp 98.0°F | Resp 20 | Ht <= 58 in | Wt 142.9 lb

## 2013-12-14 DIAGNOSIS — Z170 Estrogen receptor positive status [ER+]: Secondary | ICD-10-CM | POA: Diagnosis not present

## 2013-12-14 DIAGNOSIS — M8580 Other specified disorders of bone density and structure, unspecified site: Secondary | ICD-10-CM | POA: Diagnosis not present

## 2013-12-14 DIAGNOSIS — C50412 Malignant neoplasm of upper-outer quadrant of left female breast: Secondary | ICD-10-CM

## 2013-12-14 MED ORDER — TRAMADOL HCL 50 MG PO TABS: ORAL_TABLET | ORAL | Status: DC

## 2013-12-14 NOTE — Telephone Encounter (Signed)
, °

## 2013-12-14 NOTE — Telephone Encounter (Signed)
Med filled and faxed.  

## 2013-12-14 NOTE — Progress Notes (Signed)
[place]  Telephone:(336) [phone] Fax:(336) [phone]     ID: Patricia [name] OB: 07/03/1940  MR#: [id number]  ZHY#:[id number]  PCP: Annye Asa, MD GYN:   SU: Stark Klein OTHER MD: Thea Silversmith, Charlena Cross   CHIEF COMPLAINT: left breast cnacer, clinical stage II TREATMENT: neoadjuvant hormone therapy  BREAST CANCER HISTORY: From the initial intake note 06/14/2013:  Patricia [name] had screening mammography with tomography at [facility] 05/26/2013 showing some architectural distortion in the left breast superiorly. Ultrasound of the left breast 06/02/2013 showed a 2 cm irregular mass superiorly in the left breast, medial to the scar on the skin from a prior biopsy. This mass was palpable. 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  On 06/13/2013 the patient underwent bilateral breast MRI. This showed a 2.8 cm irregular enhancing mass in the superior left breast but no additional suspicious masses, and no abnormal adenopathy, and no findings in the right breast.  The patient's subsequent history is as detailed below.  INTERVAL HISTORY: Patricia [name] returns today for followup of her early stage breast cancer accompanied by her husband Harrie Jeans. After a washout period she started tamoxifen sometime in mid July. She had some hot flashes at first, but these are now milder. He mostly tend to occur at night, perhaps 2 nights a week. She has not had any vaginal wetness or other symptoms from the medication.  REVIEW OF SYSTEMS: She is feeling a little listless and tired as she puts it. She still walks 9 holes of golf 3 times a week. She has not had any ankle swelling, unusual headaches, visual changes, cough, phlegm production, pleurisy, shortness of breath, chest  pain or pressure, or change in bowel or bladder habits. A detailed review of systems today was negative except as noted  PAST MEDICAL HISTORY: Past Medical History  Diagnosis Date  . Cough variant asthma   . Osteopenia   . Stricture and stenosis of cervix   . Postmenopausal   . GERD (gastroesophageal reflux disease)   . Disease of pharynx or nasopharynx   . Elevated BP   . Neoplasm   . Personal history of malignant neoplasm of bronchus and lung   . Allergic rhinitis, cause unspecified   . Diverticulosis   . Internal hemorrhoids   . Hiatal hernia   . Hypertension   . Laryngospasm   . 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 Hyperlipidemia   . Glaucoma   . Laryngospasm   . Multinodular goiter     PAST SURGICAL HISTORY: Past Surgical History  Procedure Laterality Date  . Lung cancer surgery      resection carcinoid lingula  . Breast biopsy    . Lung surgery      carcinoid tumor removed    FAMILY HISTORY Family History  Problem Relation Age of Onset  . Stroke Mother   . Hypertension Mother   . Hyperlipidemia Mother   . Stroke Father   . Transient ischemic attack Father   . Hyperlipidemia Father   . Hypertension Father    the patient's father died from pneumonia the age of 23, in the setting of dementia. The patient's mother died at the age of 59 following a stroke. The patient had one brother, 4 sisters. There is no history of breast or ovarian  cancer in the family  GYNECOLOGIC HISTORY:  Menarche age 54, first live birth age 67, she is Patricia [name] P2. She did not use hormone replacement at menopause. She never took birth control pills.   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 She does a little bit of swelling on the side. Her husband [name] is retired from the [employer] (Designer, fashion/clothing) and then worked for [employer]. He is an avid Animator. Son [name] lives in [place] and is an Art gallery manager. Daughter [name] lives in [place] and is a Astronomer. The patient has 5 grandchildren. She attends the [employer]    ADVANCED DIRECTIVES: In place   HEALTH MAINTENANCE: History  Substance Use Topics  . Smoking status: Former Smoker    Quit date: 06/15/1963  . Smokeless tobacco: Not on file  . Alcohol Use: Yes     Comment: occasional wine     Colonoscopy:  PAP:  Bone density: 07/12/2013 at [facility], T score of -1.8 at the femoral neck  Lipid panel:  Allergies  Allergen Reactions  . Combigan [Brimonidine Tartrate-Timolol]     Eyes itch, reddened  . Sulfonamide Derivatives     Current Outpatient Prescriptions  Medication Sig Dispense Refill  . albuterol (VENTOLIN HFA) 108 (90 BASE) MCG/ACT inhaler Inhale 2 puffs into the lungs every 4 (four) hours as needed.  1 Inhaler  3  . Aspirin (ADULT ASPIRIN LOW STRENGTH) 81 MG EC tablet Take 81 mg by mouth daily.        . bimatoprost (LUMIGAN) 0.03 % ophthalmic drops 1 drop at bedtime.        . chlorpheniramine (CHLOR-TRIMETON) 4 MG tablet Take 4 mg by mouth. One every 6 hours as needed for nasal drainage       . fluticasone-salmeterol (ADVAIR HFA) 230-21 MCG/ACT inhaler Inhale 2 puffs into the lungs 2 (two) times daily.  3 Inhaler  3  . gabapentin (NEURONTIN) 400 MG capsule TAKE 1 CAPSULE TWICE A DAY  180 capsule  0  . loratadine (CLARITIN) 10 MG tablet Take 1 tablet (10 mg total) by mouth daily.  30 tablet  0  . Multiple Vitamins-Iron (MULTIVITAMIN/IRON) TABS Take by mouth daily.        Marland Kitchen omeprazole (PRILOSEC) 20 MG capsule TAKE 1 CAPSULE DAILY 30 MINUTES BEFORE A MEAL  90 capsule  0  . simvastatin (ZOCOR) 40 MG tablet TAKE 1 TABLET AT BEDTIME  90 tablet  1  . tamoxifen (NOLVADEX) 20 MG tablet Take 1 tablet (20 mg total) by mouth daily.  30 tablet  4  . traMADol (ULTRAM) 50 MG tablet TAKE 1 TABLET BY MOUTH EVERY 6 HOURS AS NEEDED FOR PAIN  45 tablet  0  . valsartan (DIOVAN) 40 MG tablet TAKE 1 TABLET DAILY  90 tablet  0   No current facility-administered  medications for this visit.    OBJECTIVE: Middle-aged white woman in no acute distress Filed Vitals:   12/14/13 1528  BP: 144/90  Pulse: 75  Temp: 98 F (36.7 C)  Resp: 20     Body mass index is 29.87 kg/(m^2).    ECOG FS:1 - Symptomatic but completely ambulatory  Ocular: Sclerae unicteric, pupils round and equal Ear-nose-throat: Oropharynx clear, teeth in good repair Lymphatic: No cervical or supraclavicular adenopathy Lungs no rales or rhonchi Heart regular rate and rhythm, distant heart sounds Abd soft, obese, nontender, positive bowel sounds MSK no focal spinal tenderness, no upper extremity edema Neuro: non-focal, well-oriented, positive  affect Breasts: The right breast is unremarkable. In the left breast upper outer quadrant there is still a palpable mass which appears to measure about 2 cm. It is easily movable. There is no skin involvement. There is no nipple involvement. The left axilla is benign.  LAB RESULTS:  CMP     Component Value Date/Time   NA 142 06/14/2013 0756   NA 140 12/29/2012 0922   K 4.3 06/14/2013 0756   K 4.2 12/29/2012 0922   CL 105 12/29/2012 0922   CO2 22 06/14/2013 0756   CO2 27 12/29/2012 0922   GLUCOSE 79 06/14/2013 0756   GLUCOSE 95 12/29/2012 0922   GLUCOSE 88 01/13/2006 1012   BUN 22.0 06/14/2013 0756   BUN 20 12/29/2012 0922   CREATININE 0.8 06/14/2013 0756   CREATININE 0.9 12/29/2012 0922   CALCIUM 9.1 06/14/2013 0756   CALCIUM 9.3 12/29/2012 0922   PROT 6.5 06/14/2013 0756   PROT 6.9 12/29/2012 0922   ALBUMIN 3.7 06/14/2013 0756   ALBUMIN 4.1 12/29/2012 0922   AST 18 06/14/2013 0756   AST 23 12/29/2012 0922   ALT 14 06/14/2013 0756   ALT 20 12/29/2012 0922   ALKPHOS 79 06/14/2013 0756   ALKPHOS 77 12/29/2012 0922   BILITOT 0.45 06/14/2013 0756   BILITOT 0.8 12/29/2012 0922   GFRNONAA 71.43 11/28/2009 1015   GFRAA 81 05/20/2007 1015    I No results found for this basename: SPEP,  UPEP,   kappa and lambda light chains    Lab Results   Component Value Date   WBC 5.9 06/14/2013   NEUTROABS 4.0 06/14/2013   HGB 14.1 06/14/2013   HCT 41.5 06/14/2013   MCV 91.4 06/14/2013   PLT 269 06/14/2013      Chemistry      Component Value Date/Time   NA 142 06/14/2013 0756   NA 140 12/29/2012 0922   K 4.3 06/14/2013 0756   K 4.2 12/29/2012 0922   CL 105 12/29/2012 0922   CO2 22 06/14/2013 0756   CO2 27 12/29/2012 0922   BUN 22.0 06/14/2013 0756   BUN 20 12/29/2012 0922   CREATININE 0.8 06/14/2013 0756   CREATININE 0.9 12/29/2012 0922      Component Value Date/Time   CALCIUM 9.1 06/14/2013 0756   CALCIUM 9.3 12/29/2012 0922   ALKPHOS 79 06/14/2013 0756   ALKPHOS 77 12/29/2012 0922   AST 18 06/14/2013 0756   AST 23 12/29/2012 0922   ALT 14 06/14/2013 0756   ALT 20 12/29/2012 0922   BILITOT 0.45 06/14/2013 0756   BILITOT 0.8 12/29/2012 0922       No results found for this basename: LABCA2    No components found with this basename: LABCA125    No results found for this basename: INR,  in the last 168 hours  Urinalysis    Component Value Date/Time   COLORURINE yellow 05/20/2007 0942   APPEARANCEUR Clear 05/20/2007 0942   LABSPEC <1.005 05/20/2007 0942   PHURINE 7.5 05/20/2007 0942   HGBUR negative 05/20/2007 0942   BILIRUBINUR negative 05/20/2007 0942   UROBILINOGEN 0.2 05/20/2007 0942   NITRITE negative 05/20/2007 0942    STUDIES: Mr Breast Bilateral W Wo Contrast  11/30/2013   CLINICAL DATA:  Re-evaluate known left breast malignancy following neoadjuvant therapy.  LABS:  BUN and creatinine were obtained on site at [facility] at  [address]  Results:  BUN 19 mg/dL,  Creatinine 1.0 mg/dL.  EXAM: BILATERAL BREAST MRI  WITH AND WITHOUT CONTRAST  TECHNIQUE: Multiplanar, multisequence MR images of both breasts were obtained prior to and following the intravenous administration of 46m of MultiHance  THREE-DIMENSIONAL MR IMAGE RENDERING ON INDEPENDENT WORKSTATION:  Three-dimensional MR images were rendered by  post-processing of the original MR data on an independent workstation. The three-dimensional MR images were interpreted, and findings are reported in the following complete MRI report for this study. Three dimensional images were evaluated at the independent DynaCad workstation  COMPARISON:  Previous breast MRI dated 06/13/2013.  FINDINGS: Breast composition: c:  Heterogeneous fibroglandular tissue  Background parenchymal enhancement: Mild  Right breast: No suspicious rapidly enhancing masses or abnormal areas of enhancement within the right breast to suggest malignancy.  Left breast: The known irregular left breast mass located at the 12:30 o'clock position shows remarkable decrease in enhancement consistent with central necrosis. A small amount of faint, wispy rim enhancement remains associated with the superior portion of this mass. There are no additional findings.  Lymph nodes: No abnormal appearing lymph nodes.  Ancillary findings:  None.  IMPRESSION: Considerable improvement in the enhancement associated with the known malignancy within the superior left breast located at the 12:30 o'clock position. No additional findings.  RECOMMENDATION: Treatment plan  BI-RADS CATEGORY  6: Known biopsy-proven malignancy.   Electronically Signed   By: RLuberta RobertsonM.D.   On: 11/30/2013 11:36    ASSESSMENT: 73y.o. [place] woman status post left breast biopsy for 08/19/2013 for a clinical T2 N0, stage IIA invasive lobular breast cancer, estrogen receptor positive, progesterone receptor negative, with an MIB-1 of 20%. There was not sufficient tissue for HER-2 testing.  (1) neoadjuvant anastrozole started mid April 2015, discontinued early June 2015 with poor tolerance  (2) tamoxifen started first week in July 2015  (3) osteopenia with a T score of -1.8 at the femoral necks 07/12/2013  PLAN: MBienaimeis having an early and significant response to tamoxifen. The tumor appears to be shrinking "in Swiss cheese  fashion"--not so much shrinking in size as just melting away. The image clearly shows decreased densities throughout.  Accordingly we are going to continue the tamoxifen for another 3 months and obtain another breast MRI at that point. Depending on results she may be ready to go to surgery then, or she may wish to continue yet another 3 months before definitive surgery. She does understand that surgery is inevitable regardless.  She has a good understanding of the overall plan, and agrees with it. She knows of the goal of treatment in her case is cure..Velta Addisonwill call with any problems that may develop before her next visit here   MChauncey Cruel MD   12/14/2013 3:52 PM

## 2013-12-14 NOTE — Telephone Encounter (Signed)
Last OV 12-29-12 Tramadol filled 10-19-13 #45 with 0

## 2013-12-14 NOTE — Telephone Encounter (Signed)
Ok for #45

## 2014-01-21 ENCOUNTER — Other Ambulatory Visit: Payer: Self-pay | Admitting: Family Medicine

## 2014-01-22 NOTE — Telephone Encounter (Signed)
Med filled CPE next month.

## 2014-01-26 ENCOUNTER — Other Ambulatory Visit: Payer: Self-pay | Admitting: Family Medicine

## 2014-01-26 NOTE — Telephone Encounter (Signed)
Med filled.  

## 2014-02-01 ENCOUNTER — Telehealth: Payer: Self-pay | Admitting: General Practice

## 2014-02-01 MED ORDER — TRAMADOL HCL 50 MG PO TABS: ORAL_TABLET | ORAL | Status: DC

## 2014-02-01 NOTE — Telephone Encounter (Signed)
Last OV 12-29-12 Tramadol filled 12-14-13 #45 with 0  CPE scheduled 02-08-14

## 2014-02-01 NOTE — Telephone Encounter (Signed)
Ok for #45

## 2014-02-01 NOTE — Telephone Encounter (Signed)
Med filled and faxed.  

## 2014-02-08 ENCOUNTER — Encounter: Payer: Self-pay | Admitting: Family Medicine

## 2014-02-08 ENCOUNTER — Ambulatory Visit (INDEPENDENT_AMBULATORY_CARE_PROVIDER_SITE_OTHER): Payer: Medicare Other | Admitting: Family Medicine

## 2014-02-08 VITALS — BP 124/84 | HR 79 | Temp 98.0°F | Resp 16 | Ht 59.0 in | Wt 145.2 lb

## 2014-02-08 DIAGNOSIS — Z23 Encounter for immunization: Secondary | ICD-10-CM | POA: Diagnosis not present

## 2014-02-08 DIAGNOSIS — I10 Essential (primary) hypertension: Secondary | ICD-10-CM

## 2014-02-08 DIAGNOSIS — C50412 Malignant neoplasm of upper-outer quadrant of left female breast: Secondary | ICD-10-CM

## 2014-02-08 DIAGNOSIS — M8580 Other specified disorders of bone density and structure, unspecified site: Secondary | ICD-10-CM

## 2014-02-08 DIAGNOSIS — E785 Hyperlipidemia, unspecified: Secondary | ICD-10-CM

## 2014-02-08 DIAGNOSIS — Z0000 Encounter for general adult medical examination without abnormal findings: Secondary | ICD-10-CM | POA: Diagnosis not present

## 2014-02-08 NOTE — Progress Notes (Signed)
   Subjective:    Patient ID: Patricia [name], female    DOB: Jul 25, 1940, 73 y.o.   MRN: [id number]  HPI Here today for CPE.  Risk Factors: HTN- chronic problem, on Diovan daily. Hyperlipidemia- chronic problem, on Simvastatin Osteopenia- chronic problem.  UTD on DEXA.  On Ca and Vit D Breast Cancer- dx'd this summer, following w/ Dr [name].  Currently on Tamoxifen.  Pt has plans for surgery  in January. Physical Activity: exercising regularly Fall Risk: low Depression: denies current sxs Hearing: normal to conversational tones, mildly decreased to whispered voice ADL's: independent Cognitive: normal linear thought process, memory and attention intact. Home Safety: safe at home Height, Weight, BMI, Visual Acuity: see vitals, vision corrected to 20/20 w/ glasses Counseling: UTD on mammo, DEXA, colonoscopy.  No need for paps. Labs Ordered: See A&P Care Plan: See A&P    Review of Systems Patient reports no vision/ hearing changes, adenopathy,fever, weight change,  persistant/recurrent hoarseness , swallowing issues, chest pain, palpitations, edema, persistant/recurrent cough, hemoptysis, dyspnea (rest/exertional/paroxysmal nocturnal), gastrointestinal bleeding (melena, rectal bleeding), abdominal pain, significant heartburn, bowel changes, GU symptoms (dysuria, hematuria, incontinence), Gyn symptoms (abnormal  bleeding, pain),  syncope, focal weakness, memory loss, numbness & tingling, skin/hair/nail changes, abnormal bruising or bleeding, anxiety, or depression.     Objective:   Physical Exam General Appearance:    Alert, cooperative, no distress, appears stated age  Head:    Normocephalic, without obvious abnormality, atraumatic  Eyes:    PERRL, conjunctiva/corneas clear, EOM's intact, fundi    benign, both eyes  Ears:    Normal TM's and external ear canals, both ears  Nose:   Nares normal, septum midline, mucosa normal, no drainage    or sinus tenderness  Throat:   Lips, mucosa, and  tongue normal; teeth and gums normal  Neck:   Supple, symmetrical, trachea midline, no adenopathy;    Thyroid: no enlargement/tenderness/nodules  Back:     Symmetric, no curvature, ROM normal, no CVA tenderness  Lungs:     Clear to auscultation bilaterally, respirations unlabored  Chest Wall:    No tenderness or deformity   Heart:    Regular rate and rhythm, S1 and S2 normal, no murmur, rub   or gallop  Breast Exam:    Deferred to GYN  Abdomen:     Soft, non-tender, bowel sounds active all four quadrants,    no masses, no organomegaly  Genitalia:    Deferred to GYN  Rectal:    Extremities:   Extremities normal, atraumatic, no cyanosis or edema  Pulses:   2+ and symmetric all extremities  Skin:   Skin color, texture, turgor normal, no rashes or lesions  Lymph nodes:   Cervical, supraclavicular, and axillary nodes normal  Neurologic:   CNII-XII intact, normal strength, sensation and reflexes    throughout          Assessment & Plan:

## 2014-02-08 NOTE — Patient Instructions (Signed)
Follow up in 6 months to recheck BP We'll notify you of your lab results and make any changes if needed Keep up the good work!  You look great! Call with any questions or concerns Happy Holidays! Enjoy your trips!!

## 2014-02-08 NOTE — Progress Notes (Signed)
Pre visit review using our clinic review tool, if applicable. No additional management support is needed unless otherwise documented below in the visit note. 

## 2014-02-09 ENCOUNTER — Other Ambulatory Visit: Payer: Self-pay | Admitting: Family Medicine

## 2014-02-09 DIAGNOSIS — R7989 Other specified abnormal findings of blood chemistry: Secondary | ICD-10-CM

## 2014-02-09 LAB — LIPID PANEL
CHOLESTEROL: 185 mg/dL (ref 0–200)
HDL: 63.5 mg/dL (ref >39.00)
LDL CALC: 90 mg/dL (ref 0–99)
NonHDL: 121.5
TRIGLYCERIDES: 159 mg/dL — AB (ref 0.0–149.0)
Total CHOL/HDL Ratio: 3
VLDL: 31.8 mg/dL (ref 0.0–40.0)

## 2014-02-09 LAB — CBC WITH DIFFERENTIAL/PLATELET
BASOS ABS: 0 10*3/uL (ref 0.0–0.1)
Basophils Relative: 0.3 % (ref 0.0–3.0)
Eosinophils Absolute: 0 10*3/uL (ref 0.0–0.7)
Eosinophils Relative: 0.2 % (ref 0.0–5.0)
HCT: 41 % (ref 36.0–46.0)
Hemoglobin: 13.8 g/dL (ref 12.0–15.0)
LYMPHS PCT: 23.6 % (ref 12.0–46.0)
Lymphs Abs: 1.3 10*3/uL (ref 0.7–4.0)
MCHC: 33.6 g/dL (ref 30.0–36.0)
MCV: 93.7 fl (ref 78.0–100.0)
MONOS PCT: 4.2 % (ref 3.0–12.0)
Monocytes Absolute: 0.2 10*3/uL (ref 0.1–1.0)
NEUTROS PCT: 71.7 % (ref 43.0–77.0)
Neutro Abs: 3.9 10*3/uL (ref 1.4–7.7)
PLATELETS: 267 10*3/uL (ref 150.0–400.0)
RBC: 4.37 Mil/uL (ref 3.87–5.11)
RDW: 12.9 % (ref 11.5–15.5)
WBC: 5.5 10*3/uL (ref 4.0–10.5)

## 2014-02-09 LAB — HEPATIC FUNCTION PANEL
ALBUMIN: 3.7 g/dL (ref 3.5–5.2)
ALT: 16 U/L (ref 0–35)
AST: 21 U/L (ref 0–37)
Alkaline Phosphatase: 53 U/L (ref 39–117)
Bilirubin, Direct: 0.1 mg/dL (ref 0.0–0.3)
TOTAL PROTEIN: 6.4 g/dL (ref 6.0–8.3)
Total Bilirubin: 0.5 mg/dL (ref 0.2–1.2)

## 2014-02-09 LAB — TSH: TSH: 5.15 u[IU]/mL — ABNORMAL HIGH (ref 0.35–4.50)

## 2014-02-09 LAB — BASIC METABOLIC PANEL
BUN: 25 mg/dL — AB (ref 6–23)
CHLORIDE: 109 meq/L (ref 96–112)
CO2: 25 mEq/L (ref 19–32)
CREATININE: 1 mg/dL (ref 0.4–1.2)
Calcium: 9 mg/dL (ref 8.4–10.5)
GFR: 59.78 mL/min — ABNORMAL LOW (ref >60.00)
GLUCOSE: 109 mg/dL — AB (ref 70–99)
POTASSIUM: 3.5 meq/L (ref 3.5–5.1)
Sodium: 138 mEq/L (ref 135–145)

## 2014-02-09 MED ORDER — LEVOTHYROXINE SODIUM 50 MCG PO TABS: 50.0000 ug | ORAL_TABLET | Freq: Every day | ORAL | Status: DC

## 2014-02-11 ENCOUNTER — Other Ambulatory Visit: Payer: Self-pay | Admitting: Family Medicine

## 2014-02-11 NOTE — Assessment & Plan Note (Signed)
Pt's PE WNL.  UTD on health maintenance.  Written screening schedule updated and given to pt.  Check labs.  Anticipatory guidance provided.  

## 2014-02-11 NOTE — Assessment & Plan Note (Signed)
Pt dx'd earlier this year.  Following w/ Dr [name].  Currently on Tamoxifen, attempting to shrink tumor to allow for better surgical outcome next year.  Will follow and assist as able.

## 2014-02-11 NOTE — Assessment & Plan Note (Signed)
Chronic problem.  Well controlled.  Asymptomatic.  Check labs.  No anticipated med changes. 

## 2014-02-11 NOTE — Assessment & Plan Note (Signed)
Chronic problem.  Tolerating statin w/o difficulty.  Check labs.  Adjust meds prn  

## 2014-02-11 NOTE — Assessment & Plan Note (Signed)
Chronic problem.  UTD on DEXA.  Attempted to check Vit D but insurance would not cover this test.  Will follow.

## 2014-02-12 NOTE — Telephone Encounter (Signed)
Med filled.  

## 2014-02-13 ENCOUNTER — Other Ambulatory Visit: Payer: Self-pay | Admitting: Family Medicine

## 2014-02-13 NOTE — Telephone Encounter (Signed)
Med filled.  

## 2014-02-15 ENCOUNTER — Other Ambulatory Visit: Payer: Self-pay | Admitting: Oncology

## 2014-02-15 DIAGNOSIS — H52203 Unspecified astigmatism, bilateral: Secondary | ICD-10-CM | POA: Diagnosis not present

## 2014-02-15 DIAGNOSIS — H2513 Age-related nuclear cataract, bilateral: Secondary | ICD-10-CM | POA: Diagnosis not present

## 2014-02-15 DIAGNOSIS — H4011X3 Primary open-angle glaucoma, severe stage: Secondary | ICD-10-CM | POA: Diagnosis not present

## 2014-02-15 DIAGNOSIS — C50412 Malignant neoplasm of upper-outer quadrant of left female breast: Secondary | ICD-10-CM

## 2014-02-15 DIAGNOSIS — H04123 Dry eye syndrome of bilateral lacrimal glands: Secondary | ICD-10-CM | POA: Diagnosis not present

## 2014-03-07 ENCOUNTER — Other Ambulatory Visit: Payer: Self-pay | Admitting: *Deleted

## 2014-03-07 DIAGNOSIS — C50412 Malignant neoplasm of upper-outer quadrant of left female breast: Secondary | ICD-10-CM

## 2014-03-08 ENCOUNTER — Other Ambulatory Visit (HOSPITAL_BASED_OUTPATIENT_CLINIC_OR_DEPARTMENT_OTHER): Payer: Medicare Other

## 2014-03-08 DIAGNOSIS — C50412 Malignant neoplasm of upper-outer quadrant of left female breast: Secondary | ICD-10-CM | POA: Diagnosis not present

## 2014-03-08 LAB — CBC WITH DIFFERENTIAL/PLATELET
BASO%: 0.9 % (ref 0.0–2.0)
Basophils Absolute: 0.1 10*3/uL (ref 0.0–0.1)
EOS%: 0.1 % (ref 0.0–7.0)
Eosinophils Absolute: 0 10*3/uL (ref 0.0–0.5)
HCT: 43.2 % (ref 34.8–46.6)
HGB: 14.1 g/dL (ref 11.6–15.9)
LYMPH%: 22.6 % (ref 14.0–49.7)
MCH: 31.4 pg (ref 25.1–34.0)
MCHC: 32.7 g/dL (ref 31.5–36.0)
MCV: 95.8 fL (ref 79.5–101.0)
MONO#: 0.4 10*3/uL (ref 0.1–0.9)
MONO%: 7 % (ref 0.0–14.0)
NEUT#: 3.9 10*3/uL (ref 1.5–6.5)
NEUT%: 69.4 % (ref 38.4–76.8)
PLATELETS: 239 10*3/uL (ref 145–400)
RBC: 4.5 10*6/uL (ref 3.70–5.45)
RDW: 12.6 % (ref 11.2–14.5)
WBC: 5.5 10*3/uL (ref 3.9–10.3)
lymph#: 1.3 10*3/uL (ref 0.9–3.3)

## 2014-03-08 LAB — COMPREHENSIVE METABOLIC PANEL (CC13)
ALT: 15 U/L (ref 0–55)
AST: 19 U/L (ref 5–34)
Albumin: 3.6 g/dL (ref 3.5–5.0)
Alkaline Phosphatase: 63 U/L (ref 40–150)
Anion Gap: 8 mEq/L (ref 3–11)
BUN: 24 mg/dL (ref 7.0–26.0)
CALCIUM: 8.9 mg/dL (ref 8.4–10.4)
CHLORIDE: 107 meq/L (ref 98–109)
CO2: 27 meq/L (ref 22–29)
Creatinine: 0.9 mg/dL (ref 0.6–1.1)
EGFR: 60 mL/min/{1.73_m2} — AB (ref >90)
Glucose: 79 mg/dl (ref 70–140)
Potassium: 4.2 mEq/L (ref 3.5–5.1)
Sodium: 141 mEq/L (ref 136–145)
Total Bilirubin: 0.46 mg/dL (ref 0.20–1.20)
Total Protein: 6.5 g/dL (ref 6.4–8.3)

## 2014-03-15 ENCOUNTER — Telehealth: Payer: Self-pay | Admitting: Oncology

## 2014-03-15 ENCOUNTER — Ambulatory Visit (HOSPITAL_BASED_OUTPATIENT_CLINIC_OR_DEPARTMENT_OTHER): Payer: Medicare Other | Admitting: Oncology

## 2014-03-15 VITALS — BP 114/63 | HR 77 | Temp 98.1°F | Resp 18 | Ht 59.0 in | Wt 147.3 lb

## 2014-03-15 DIAGNOSIS — I10 Essential (primary) hypertension: Secondary | ICD-10-CM

## 2014-03-15 DIAGNOSIS — C50812 Malignant neoplasm of overlapping sites of left female breast: Secondary | ICD-10-CM | POA: Diagnosis not present

## 2014-03-15 DIAGNOSIS — C50412 Malignant neoplasm of upper-outer quadrant of left female breast: Secondary | ICD-10-CM

## 2014-03-15 DIAGNOSIS — M8580 Other specified disorders of bone density and structure, unspecified site: Secondary | ICD-10-CM | POA: Diagnosis not present

## 2014-03-15 DIAGNOSIS — K5730 Diverticulosis of large intestine without perforation or abscess without bleeding: Secondary | ICD-10-CM

## 2014-03-15 DIAGNOSIS — J45991 Cough variant asthma: Secondary | ICD-10-CM

## 2014-03-15 DIAGNOSIS — Z170 Estrogen receptor positive status [ER+]: Secondary | ICD-10-CM | POA: Diagnosis not present

## 2014-03-15 DIAGNOSIS — K222 Esophageal obstruction: Secondary | ICD-10-CM

## 2014-03-15 DIAGNOSIS — Z85118 Personal history of other malignant neoplasm of bronchus and lung: Secondary | ICD-10-CM

## 2014-03-15 MED ORDER — TAMOXIFEN CITRATE 20 MG PO TABS: 20.0000 mg | ORAL_TABLET | Freq: Every day | ORAL | Status: DC

## 2014-03-15 NOTE — Telephone Encounter (Signed)
[facility] APPT SCHEDULE FOR APRIL AND APPT W/DR Barry Dienes FORO 3/21 @ 2:15PM - 1ST AVAILABLE.

## 2014-03-15 NOTE — Progress Notes (Signed)
[facility]  [phone] [phone]     ID: [name] OB: May 15, 74 1942  MR#: [id number]  POE#:[id number]  PCP: Annye Asa, MD GYN:   SU: Stark Klein OTHER MD: Thea Silversmith, Charlena Cross   CHIEF COMPLAINT: left breast cnacer, clinical stage II TREATMENT: neoadjuvant tamoxifen  BREAST CANCER HISTORY: From the initial intake note 06/14/2013:  [name] had screening mammography with tomography at [facility] 05/26/2013 showing some architectural distortion in the left breast superiorly. Ultrasound of the left breast 06/02/2013 showed a 2 cm irregular mass superiorly in the left breast, medial to the scar on the skin from a prior biopsy. This mass was palpable. 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  On 06/13/2013 the patient underwent bilateral breast MRI. This showed a 2.8 cm irregular enhancing mass in the superior left breast but no additional suspicious masses, and no abnormal adenopathy, and no findings in the right breast.  The patient's subsequent history is as detailed below.  INTERVAL HISTORY: Bethune returns today for followup of her early stage breast the interval history is unremarkable. She is tolerating the tamoxifen with no hot flashes, vaginal discharge, or other side effects that she is aware of..She pays $9 a month for the drug, but tells me if [facility] she could receive 3 months for the same cost.   REVIEW OF SYSTEMS: She keeps a mild cough secondary to her asthma, but this is well-controlled. She has minimal arthritis symptoms. She plays golf twice a week. She takes occasional walks. A detailed review of systems today was otherwise stable  PAST MEDICAL HISTORY: Past  Medical History  Diagnosis Date  . Cough variant asthma   . Osteopenia   . Stricture and stenosis of cervix   . Postmenopausal   . GERD (gastroesophageal reflux disease)   . Disease of pharynx or nasopharynx   . Elevated BP   . Neoplasm   . Personal history of malignant neoplasm of bronchus and lung   . Allergic rhinitis, cause unspecified   . Diverticulosis   . Internal hemorrhoids   . Hiatal hernia   . Hypertension   . Laryngospasm   . 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 Hyperlipidemia   . Glaucoma   . Laryngospasm   . Multinodular goiter     PAST SURGICAL HISTORY: Past Surgical History  Procedure Laterality Date  . Lung cancer surgery      resection carcinoid lingula  . Breast biopsy    . Lung surgery      carcinoid tumor removed    FAMILY HISTORY Family History  Problem Relation Age of Onset  . Stroke Mother   . Hypertension Mother   . Hyperlipidemia Mother   . Stroke Father   . Transient ischemic attack Father   . Hyperlipidemia Father   . Hypertension Father    the patient's father died from pneumonia the age of 23, in the setting of dementia. The patient's mother died at the age of 61 following a stroke. The patient had one brother, 4 sisters. There is no history of breast or ovarian cancer in the family  GYNECOLOGIC HISTORY:  Menarche age 18, first live birth age 43, she is [place] P2. She did not use hormone replacement at menopause.  She never took birth control pills.   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 She does a little bit of swelling on the side. Her husband [name] is retired from the [employer] (Designer, fashion/clothing) and then worked for [employer]. He is an avid Animator. Son [name] lives in [place] and is an Art gallery manager. Daughter [name] lives in [place] and is a Astronomer. The patient has 5 grandchildren. She attends the [employer]    ADVANCED DIRECTIVES: In place   HEALTH  MAINTENANCE: History  Substance Use Topics  . Smoking status: Former Smoker    Quit date: 06/15/1963  . Smokeless tobacco: Not on file  . Alcohol Use: Yes     Comment: occasional wine     Colonoscopy:  PAP:  Bone density: 07/12/2013 at [facility], T score of -1.8 at the femoral neck  Lipid panel:  Allergies  Allergen Reactions  . Combigan [Brimonidine Tartrate-Timolol]     Eyes itch, reddened  . Sulfonamide Derivatives     Current Outpatient Prescriptions  Medication Sig Dispense Refill  . ADVAIR HFA 230-21 MCG/ACT inhaler INHALE 2 PUFFS INTO THE LUNGS TWICE A DAY 3 Inhaler 2  . albuterol (VENTOLIN HFA) 108 (90 BASE) MCG/ACT inhaler Inhale 2 puffs into the lungs every 4 (four) hours as needed. 1 Inhaler 3  . Aspirin (ADULT ASPIRIN LOW STRENGTH) 81 MG EC tablet Take 81 mg by mouth daily.      . bimatoprost (LUMIGAN) 0.03 % ophthalmic drops 1 drop at bedtime.      . chlorpheniramine (CHLOR-TRIMETON) 4 MG tablet Take 4 mg by mouth. One every 6 hours as needed for nasal drainage     . gabapentin (NEURONTIN) 400 MG capsule TAKE 1 CAPSULE TWICE A DAY 180 capsule 0  . levothyroxine (SYNTHROID, LEVOTHROID) 50 MCG tablet Take 1 tablet (50 mcg total) by mouth daily. 30 tablet 1  . loratadine (CLARITIN) 10 MG tablet Take 1 tablet (10 mg total) by mouth daily. 30 tablet 0  . Multiple Vitamins-Iron (MULTIVITAMIN/IRON) TABS Take by mouth daily.      Marland Kitchen omeprazole (PRILOSEC) 20 MG capsule TAKE 1 CAPSULE DAILY 30 MINUTES BEFORE A MEAL 90 capsule 0  . simvastatin (ZOCOR) 40 MG tablet TAKE 1 TABLET AT BEDTIME 90 tablet 0  . tamoxifen (NOLVADEX) 20 MG tablet TAKE 1 TABLET (20 MG TOTAL) BY MOUTH DAILY. 30 tablet 1  . traMADol (ULTRAM) 50 MG tablet TAKE 1 TABLET BY MOUTH EVERY 6 HOURS AS NEEDED FOR PAIN 45 tablet 0  . valsartan (DIOVAN) 40 MG tablet TAKE 1 TABLET DAILY 90 tablet 0   No current facility-administered medications for this visit.    OBJECTIVE: Middle-aged white woman who appears stated  age  74 Vitals:   03/15/14 1415  BP: 114/63  Pulse: 77  Temp: 98.1 F (36.7 C)  Resp: 18     Body mass index is 29.74 kg/(m^2).    ECOG FS:1 - Symptomatic but completely ambulatory  Sclerae unicteric but the right sclera is slightly injected  Oropharynx clear, teeth in good repair No cervical or supraclavicular adenopathy Lungs no rales or rhonchi Heart regular rate and rhythm, Abd soft, obese, nontender, positive bowel sounds MSK no focal spinal tenderness, no upper extremity edema Neuro: non-focal, well-oriented, positive affect Breasts: The right breast is unremarkable. In the left breast upper outer quadrant therewas a palpable mass which I can no longer locate  There is no skin involvement. There is no nipple involvement. The left  axilla is benign.  LAB RESULTS:  CMP     Component Value Date/Time   NA 141 03/08/2014 1031   NA 138 02/08/2014 1614   K 4.2 03/08/2014 1031   K 3.5 02/08/2014 1614   CL 109 02/08/2014 1614   CO2 27 03/08/2014 1031   CO2 25 02/08/2014 1614   GLUCOSE 79 03/08/2014 1031   GLUCOSE 109* 02/08/2014 1614   GLUCOSE 88 01/13/2006 1012   BUN 24.0 03/08/2014 1031   BUN 25* 02/08/2014 1614   CREATININE 0.9 03/08/2014 1031   CREATININE 1.0 02/08/2014 1614   CALCIUM 8.9 03/08/2014 1031   CALCIUM 9.0 02/08/2014 1614   PROT 6.5 03/08/2014 1031   PROT 6.4 02/08/2014 1614   ALBUMIN 3.6 03/08/2014 1031   ALBUMIN 3.7 02/08/2014 1614   AST 19 03/08/2014 1031   AST 21 02/08/2014 1614   ALT 15 03/08/2014 1031   ALT 16 02/08/2014 1614   ALKPHOS 63 03/08/2014 1031   ALKPHOS 53 02/08/2014 1614   BILITOT 0.46 03/08/2014 1031   BILITOT 0.5 02/08/2014 1614   GFRNONAA 71.43 11/28/2009 1015   GFRAA 81 05/20/2007 1015    I No results found for: SPEP  Lab Results  Component Value Date   WBC 5.5 03/08/2014   NEUTROABS 3.9 03/08/2014   HGB 14.1 03/08/2014   HCT 43.2 03/08/2014   MCV 95.8 03/08/2014   PLT 239 03/08/2014      Chemistry       Component Value Date/Time   NA 141 03/08/2014 1031   NA 138 02/08/2014 1614   K 4.2 03/08/2014 1031   K 3.5 02/08/2014 1614   CL 109 02/08/2014 1614   CO2 27 03/08/2014 1031   CO2 25 02/08/2014 1614   BUN 24.0 03/08/2014 1031   BUN 25* 02/08/2014 1614   CREATININE 0.9 03/08/2014 1031   CREATININE 1.0 02/08/2014 1614      Component Value Date/Time   CALCIUM 8.9 03/08/2014 1031   CALCIUM 9.0 02/08/2014 1614   ALKPHOS 63 03/08/2014 1031   ALKPHOS 53 02/08/2014 1614   AST 19 03/08/2014 1031   AST 21 02/08/2014 1614   ALT 15 03/08/2014 1031   ALT 16 02/08/2014 1614   BILITOT 0.46 03/08/2014 1031   BILITOT 0.5 02/08/2014 1614       No results found for: LABCA2  No components found for: LABCA125  No results for input(s): INR in the last 168 hours.  Urinalysis    Component Value Date/Time   COLORURINE yellow 05/20/2007 0942   APPEARANCEUR Clear 05/20/2007 0942   LABSPEC <1.005 05/20/2007 0942   PHURINE 7.5 05/20/2007 0942   HGBUR negative 05/20/2007 0942   BILIRUBINUR negative 05/20/2007 0942   UROBILINOGEN 0.2 05/20/2007 0942   NITRITE negative 05/20/2007 0942    STUDIES: CLINICAL DATA: Re-evaluate known left breast malignancy following neoadjuvant therapy.  LABS: BUN and creatinine were obtained on site at [place] at  [address]  Results: BUN 19 mg/dL, Creatinine 1.0 mg/dL.  EXAM: BILATERAL BREAST MRI WITH AND WITHOUT CONTRAST  TECHNIQUE: Multiplanar, multisequence MR images of both breasts were obtained prior to and following the intravenous administration of 43m of MultiHance  THREE-DIMENSIONAL MR IMAGE RENDERING ON INDEPENDENT WORKSTATION:  Three-dimensional MR images were rendered by post-processing of the original MR data on an independent workstation. The three-dimensional MR images were interpreted, and findings are reported in the following complete MRI report for this study. Three dimensional images were  evaluated at the independent DynaCad workstation  COMPARISON: Previous breast MRI dated 06/13/2013.  FINDINGS: Breast composition: c: Heterogeneous fibroglandular tissue  Background parenchymal enhancement: Mild  Right breast: No suspicious rapidly enhancing masses or abnormal areas of enhancement within the right breast to suggest malignancy.  Left breast: The known irregular left breast mass located at the 12:30 o'clock position shows remarkable decrease in enhancement consistent with central necrosis. A small amount of faint, wispy rim enhancement remains associated with the superior portion of this mass. There are no additional findings.  Lymph nodes: No abnormal appearing lymph nodes.  Ancillary findings: None.  IMPRESSION: Considerable improvement in the enhancement associated with the known malignancy within the superior left breast located at the 12:30 o'clock position. No additional findings.  RECOMMENDATION: Treatment plan  BI-RADS CATEGORY 6: Known biopsy-proven malignancy.   Electronically Signed  By: Luberta Robertson M.D.  On: 11/30/2013 11:36   ASSESSMENT: 74 y.o. [place] woman status post left breast biopsy for 08/19/2013 for a clinical T2 N0, stage IIA invasive lobular breast cancer, estrogen receptor positive, progesterone receptor negative, with an MIB-1 of 20%. There was not sufficient tissue for HER-2 testing.  (1) neoadjuvant anastrozole started mid April 2015, discontinued early June 2015 with poor tolerance  (2) tamoxifen started first week in July 2015  (3) osteopenia with a T score of -1.8 at the femoral necks 07/12/2013  (4) definitive surgery tentatively planned for March 2016  (5) radiation to follow surgery as appropriate   PLAN: Reveron is tolerating the tamoxifen well, and it appears to be working, since there is no palpable abnormality in the right breast at present. The MRI in October showed a significant  response.  she is obtaining the tamoxifen at a good price but she will get it at an even better price by [facility] that I was glad to transfer the prescription there today.   At this time we are ready to proceed to surgery. The patient has a [place] cruise planned and it will not be returning until the end of February. Accordingly I'm requesting an appointment with Dr. Barry Dienes for the first week in March.  In the meantime of course we are continuing the tamoxifen and the plan is to stay on that drug for a minimum of 5 years. After the surgery the patient will return to see me and radiation oncology to make a decision regarding adjuvant radiation treatment  Montejano has a good understanding of the overall plan, and agrees with it. She knows of the goal of treatment in her case is cure.Velta Addison will call with any problems that may develop before her next visit here   Chauncey Cruel, MD   03/15/2014 2:36 PM

## 2014-03-15 NOTE — Telephone Encounter (Signed)
ADD TO PREVIOUS NOTE - S/W JESSICA AT [facility] TO SCHEDULE 3/21 APPT.

## 2014-03-20 ENCOUNTER — Other Ambulatory Visit: Payer: Self-pay | Admitting: Family Medicine

## 2014-03-20 ENCOUNTER — Telehealth: Payer: Self-pay | Admitting: General Practice

## 2014-03-20 MED ORDER — TRAMADOL HCL 50 MG PO TABS: ORAL_TABLET | ORAL | Status: DC

## 2014-03-20 NOTE — Telephone Encounter (Signed)
Last OV 02-08-14 Tramadol last filled 02-01-14 #45 with 0

## 2014-03-20 NOTE — Telephone Encounter (Signed)
Ok for #45

## 2014-03-20 NOTE — Telephone Encounter (Signed)
Med filled.  

## 2014-03-20 NOTE — Telephone Encounter (Signed)
Med filled and faxed.  

## 2014-03-29 ENCOUNTER — Other Ambulatory Visit: Payer: Self-pay | Admitting: Family Medicine

## 2014-03-29 NOTE — Telephone Encounter (Signed)
Med filled.  

## 2014-04-04 ENCOUNTER — Other Ambulatory Visit: Payer: Self-pay | Admitting: Family Medicine

## 2014-04-04 NOTE — Telephone Encounter (Signed)
Med filled.  

## 2014-04-26 ENCOUNTER — Other Ambulatory Visit: Payer: Self-pay | Admitting: Family Medicine

## 2014-04-27 ENCOUNTER — Other Ambulatory Visit: Payer: Self-pay | Admitting: Family Medicine

## 2014-04-27 NOTE — Telephone Encounter (Signed)
Med filled.  

## 2014-05-03 ENCOUNTER — Other Ambulatory Visit: Payer: Self-pay | Admitting: General Practice

## 2014-05-03 MED ORDER — TRAMADOL HCL 50 MG PO TABS: ORAL_TABLET | ORAL | Status: DC

## 2014-05-03 NOTE — Telephone Encounter (Signed)
Last OV 02-08-14 Tramadol last filled 03-20-14 #45 with 0

## 2014-05-03 NOTE — Telephone Encounter (Signed)
Med filled and faxed.  

## 2014-05-14 ENCOUNTER — Ambulatory Visit: Payer: Medicare Other | Admitting: Family Medicine

## 2014-05-14 ENCOUNTER — Ambulatory Visit (INDEPENDENT_AMBULATORY_CARE_PROVIDER_SITE_OTHER): Payer: Medicare Other | Admitting: Physician Assistant

## 2014-05-14 VITALS — BP 124/78 | HR 76 | Temp 98.3°F | Resp 16 | Ht 59.0 in | Wt 145.4 lb

## 2014-05-14 DIAGNOSIS — R5383 Other fatigue: Secondary | ICD-10-CM | POA: Diagnosis not present

## 2014-05-14 DIAGNOSIS — E032 Hypothyroidism due to medicaments and other exogenous substances: Secondary | ICD-10-CM

## 2014-05-14 NOTE — Progress Notes (Signed)
Pre visit review using our clinic review tool, if applicable. No additional management support is needed unless otherwise documented below in the visit note/SLS  

## 2014-05-14 NOTE — Patient Instructions (Signed)
Please go to the lab for blood work.  I will call you with your results. Stay well hydrated and eat a well balanced diet.  Continue medications as directed.  We will alter your regimen based on results.

## 2014-05-15 ENCOUNTER — Encounter: Payer: Self-pay | Admitting: Physician Assistant

## 2014-05-15 DIAGNOSIS — E032 Hypothyroidism due to medicaments and other exogenous substances: Secondary | ICD-10-CM | POA: Insufficient documentation

## 2014-05-15 LAB — CBC
HEMATOCRIT: 40.5 % (ref 36.0–46.0)
HEMOGLOBIN: 13.7 g/dL (ref 12.0–15.0)
MCHC: 33.9 g/dL (ref 30.0–36.0)
MCV: 92.6 fl (ref 78.0–100.0)
Platelets: 248 10*3/uL (ref 150.0–400.0)
RBC: 4.38 Mil/uL (ref 3.87–5.11)
RDW: 13 % (ref 11.5–15.5)
WBC: 5.6 10*3/uL (ref 4.0–10.5)

## 2014-05-15 LAB — COMPREHENSIVE METABOLIC PANEL
ALK PHOS: 51 U/L (ref 39–117)
ALT: 18 U/L (ref 0–35)
AST: 20 U/L (ref 0–37)
Albumin: 3.8 g/dL (ref 3.5–5.2)
BILIRUBIN TOTAL: 0.4 mg/dL (ref 0.2–1.2)
BUN: 19 mg/dL (ref 6–23)
CO2: 26 mEq/L (ref 19–32)
Calcium: 8.8 mg/dL (ref 8.4–10.5)
Chloride: 109 mEq/L (ref 96–112)
Creatinine, Ser: 1.05 mg/dL (ref 0.40–1.20)
GFR: 54.51 mL/min — ABNORMAL LOW (ref >60.00)
GLUCOSE: 127 mg/dL — AB (ref 70–99)
POTASSIUM: 3.6 meq/L (ref 3.5–5.1)
Sodium: 139 mEq/L (ref 135–145)
Total Protein: 6.3 g/dL (ref 6.0–8.3)

## 2014-05-15 LAB — TSH: TSH: 2.15 u[IU]/mL (ref 0.35–4.50)

## 2014-05-15 NOTE — Progress Notes (Signed)
Patient presents to clinic today c/o continued fatigue, mental sluggishness and cold intolerance.  Patient recently underwent RIA of thyroid.  Was started on 50 mcg levothyroxine due to hypothyroid state and symptoms.  Endorses taking as directed.  Denies increased stress or anxiety. Endorses good fluid and food intake.  Endorses good urinary and bowel output.  Past Medical History  Diagnosis Date  . Cough variant asthma   . Osteopenia   . Stricture and stenosis of cervix   . Postmenopausal   . GERD (gastroesophageal reflux disease)   . Disease of pharynx or nasopharynx   . Elevated BP   . Neoplasm   . Personal history of malignant neoplasm of bronchus and lung   . Allergic rhinitis, cause unspecified   . Diverticulosis   . Internal hemorrhoids   . Hiatal hernia   . Hypertension   . Laryngospasm   . 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 Hyperlipidemia   . Glaucoma   . Laryngospasm   . Multinodular goiter     Current Outpatient Prescriptions on File Prior to Visit  Medication Sig Dispense Refill  . ADVAIR HFA 230-21 MCG/ACT inhaler INHALE 2 PUFFS INTO THE LUNGS TWICE A DAY 3 Inhaler 2  . albuterol (VENTOLIN HFA) 108 (90 BASE) MCG/ACT inhaler Inhale 2 puffs into the lungs every 4 (four) hours as needed. 1 Inhaler 3  . Aspirin (ADULT ASPIRIN LOW STRENGTH) 81 MG EC tablet Take 81 mg by mouth daily.      . bimatoprost (LUMIGAN) 0.03 % ophthalmic drops 1 drop at bedtime.      . gabapentin (NEURONTIN) 400 MG capsule TAKE 1 CAPSULE TWICE A DAY 180 capsule 1  . levothyroxine (SYNTHROID, LEVOTHROID) 50 MCG tablet TAKE 1 TABLET (50 MCG TOTAL) BY MOUTH DAILY. 30 tablet 3  . loratadine (CLARITIN) 10 MG tablet Take 1 tablet (10 mg total) by mouth daily. 30 tablet 0  . Multiple Vitamins-Iron (MULTIVITAMIN/IRON) TABS Take by mouth daily.      Marland Kitchen omeprazole (PRILOSEC) 20 MG capsule TAKE 1 CAPSULE DAILY 30 MINUTES BEFORE A MEAL 90 capsule 0  . simvastatin (ZOCOR) 40 MG tablet  TAKE 1 TABLET AT BEDTIME 90 tablet 1  . tamoxifen (NOLVADEX) 20 MG tablet Take 1 tablet (20 mg total) by mouth daily. 90 tablet 4  . traMADol (ULTRAM) 50 MG tablet TAKE 1 TABLET BY MOUTH EVERY 6 HOURS AS NEEDED FOR PAIN 45 tablet 0  . valsartan (DIOVAN) 40 MG tablet TAKE 1 TABLET DAILY 90 tablet 1   No current facility-administered medications on file prior to visit.    Allergies  Allergen Reactions  . Combigan [Brimonidine Tartrate-Timolol]     Eyes itch, reddened  . Sulfonamide Derivatives     Family History  Problem Relation Age of Onset  . Stroke Mother   . Hypertension Mother   . Hyperlipidemia Mother   . Stroke Father   . Transient ischemic attack Father   . Hyperlipidemia Father   . Hypertension Father     History   Social History  . Marital Status: Married    Spouse Name: N/A  . Number of Children: N/A  . Years of Education: N/A   Social History Main Topics  . Smoking status: Former Smoker    Quit date: 06/15/1963  . Smokeless tobacco: Not on file  . Alcohol Use: Yes     Comment: occasional wine  . Drug Use: No  . Sexual Activity:  Not on file   Other Topics Concern  . Not on file   Social History Narrative    Review of Systems - See HPI.  All other ROS are negative.  BP 124/78 mmHg  Pulse 76  Temp(Src) 98.3 F (36.8 C) (Oral)  Resp 16  Ht 4' 11"  (1.499 m)  Wt 145 lb 6 oz (65.942 kg)  BMI 29.35 kg/m2  SpO2 98%  Physical Exam  Constitutional: She is oriented to person, place, and time and well-developed, well-nourished, and in no distress.  HENT:  Head: Normocephalic and atraumatic.  Eyes: Conjunctivae are normal.  Neck: Neck supple.  Cardiovascular: Normal rate, regular rhythm, normal heart sounds and intact distal pulses.   Pulmonary/Chest: Effort normal and breath sounds normal. No respiratory distress. She has no wheezes. She has no rales. She exhibits no tenderness.  Lymphadenopathy:    She has no cervical adenopathy.  Neurological:  She is alert and oriented to person, place, and time.  Skin: Skin is warm and dry. No rash noted.  Psychiatric: Affect normal.  Vitals reviewed.   Recent Results (from the past 2160 hour(s))  CBC with Differential     Status: None   Collection Time: 03/08/14 10:31 AM  Result Value Ref Range   WBC 5.5 3.9 - 10.3 10e3/uL   NEUT# 3.9 1.5 - 6.5 10e3/uL   HGB 14.1 11.6 - 15.9 g/dL   HCT 43.2 34.8 - 46.6 %   Platelets 239 145 - 400 10e3/uL   MCV 95.8 79.5 - 101.0 fL   MCH 31.4 25.1 - 34.0 pg   MCHC 32.7 31.5 - 36.0 g/dL   RBC 4.50 3.70 - 5.45 10e6/uL   RDW 12.6 11.2 - 14.5 %   lymph# 1.3 0.9 - 3.3 10e3/uL   MONO# 0.4 0.1 - 0.9 10e3/uL   Eosinophils Absolute 0.0 0.0 - 0.5 10e3/uL   Basophils Absolute 0.1 0.0 - 0.1 10e3/uL   NEUT% 69.4 38.4 - 76.8 %   LYMPH% 22.6 14.0 - 49.7 %   MONO% 7.0 0.0 - 14.0 %   EOS% 0.1 0.0 - 7.0 %   BASO% 0.9 0.0 - 2.0 %  Comprehensive metabolic panel (Cmet) - [facility]     Status: Abnormal   Collection Time: 03/08/14 10:31 AM  Result Value Ref Range   Sodium 141 136 - 145 mEq/L   Potassium 4.2 3.5 - 5.1 mEq/L   Chloride 107 98 - 109 mEq/L   CO2 27 22 - 29 mEq/L   Glucose 79 70 - 140 mg/dl   BUN 24.0 7.0 - 26.0 mg/dL   Creatinine 0.9 0.6 - 1.1 mg/dL   Total Bilirubin 0.46 0.20 - 1.20 mg/dL   Alkaline Phosphatase 63 40 - 150 U/L   AST 19 5 - 34 U/L   ALT 15 0 - 55 U/L   Total Protein 6.5 6.4 - 8.3 g/dL   Albumin 3.6 3.5 - 5.0 g/dL   Calcium 8.9 8.4 - 10.4 mg/dL   Anion Gap 8 3 - 11 mEq/L   EGFR 60 (L) >90 ml/min/1.73 m2    Comment: eGFR is calculated using the CKD-EPI Creatinine Equation (2009)    Assessment/Plan: Hypothyroidism due to medication Will recheck TSH today.  Giving fatigue will also check CBC and CMP.  Will treat or proceed with further workup based on results. Supportive measures discussed.  Continue levothyroxine at current dose until called with results.

## 2014-05-15 NOTE — Assessment & Plan Note (Signed)
Will recheck TSH today.  Giving fatigue will also check CBC and CMP.  Will treat or proceed with further workup based on results. Supportive measures discussed.  Continue levothyroxine at current dose until called with results.

## 2014-05-21 ENCOUNTER — Other Ambulatory Visit: Payer: Self-pay | Admitting: General Surgery

## 2014-05-21 ENCOUNTER — Telehealth: Payer: Self-pay | Admitting: Family Medicine

## 2014-05-21 DIAGNOSIS — C50412 Malignant neoplasm of upper-outer quadrant of left female breast: Secondary | ICD-10-CM

## 2014-05-21 MED ORDER — LEVOTHYROXINE SODIUM 50 MCG PO TABS: ORAL_TABLET | ORAL | Status: DC

## 2014-05-21 NOTE — Telephone Encounter (Signed)
Caller name:Nofziger, Malcolm Relation to RA:QTMA Call back [phone] [facility]  Reason for call: pt states she is needing her rx levothyroxine (SYNTHROID, LEVOTHROID) 50 MCG tablet [facility].

## 2014-05-21 NOTE — Telephone Encounter (Signed)
Med filled.  

## 2014-06-01 ENCOUNTER — Other Ambulatory Visit: Payer: Self-pay | Admitting: General Surgery

## 2014-06-01 DIAGNOSIS — C50412 Malignant neoplasm of upper-outer quadrant of left female breast: Secondary | ICD-10-CM

## 2014-06-04 ENCOUNTER — Telehealth: Payer: Self-pay | Admitting: Family Medicine

## 2014-06-04 DIAGNOSIS — E032 Hypothyroidism due to medicaments and other exogenous substances: Secondary | ICD-10-CM

## 2014-06-04 MED ORDER — THYROID 15 MG PO TABS: 15.0000 mg | ORAL_TABLET | Freq: Every day | ORAL | Status: DC

## 2014-06-04 NOTE — Telephone Encounter (Signed)
It is very unlikely to be related to her levothyroxine.  We can switch her to Armour Thyroid 15 if she prefers but I don't think this will explain her hip and knee pain.  If she does want to switch, will need lab only TSH visit in 1 month

## 2014-06-04 NOTE — Telephone Encounter (Signed)
Pt notified, med filled to local pharmacy as requested, labs ordered, and pt scheduled for 07/04/14.

## 2014-06-04 NOTE — Telephone Encounter (Signed)
Caller name: Patricia [name] Relation to pt: self Call back number: [phone] Pharmacy: [facility] on [address]  Reason for call:   Patient states that she has been having joint pain(hip and knee) and wants to know if that could be a side effect to levothryoxine? If so, she wants to know if she could start taking armour thryroid?

## 2014-06-12 ENCOUNTER — Other Ambulatory Visit: Payer: Self-pay | Admitting: *Deleted

## 2014-06-12 DIAGNOSIS — C50412 Malignant neoplasm of upper-outer quadrant of left female breast: Secondary | ICD-10-CM

## 2014-06-13 ENCOUNTER — Other Ambulatory Visit: Payer: Self-pay | Admitting: *Deleted

## 2014-06-13 ENCOUNTER — Telehealth: Payer: Self-pay | Admitting: Oncology

## 2014-06-13 ENCOUNTER — Ambulatory Visit (HOSPITAL_BASED_OUTPATIENT_CLINIC_OR_DEPARTMENT_OTHER): Payer: Medicare Other | Admitting: Oncology

## 2014-06-13 ENCOUNTER — Other Ambulatory Visit (HOSPITAL_BASED_OUTPATIENT_CLINIC_OR_DEPARTMENT_OTHER): Payer: Medicare Other

## 2014-06-13 VITALS — BP 150/89 | HR 61 | Temp 98.0°F | Resp 18 | Ht 59.0 in | Wt 146.7 lb

## 2014-06-13 DIAGNOSIS — C50412 Malignant neoplasm of upper-outer quadrant of left female breast: Secondary | ICD-10-CM

## 2014-06-13 DIAGNOSIS — Z853 Personal history of malignant neoplasm of breast: Secondary | ICD-10-CM

## 2014-06-13 DIAGNOSIS — M8580 Other specified disorders of bone density and structure, unspecified site: Secondary | ICD-10-CM | POA: Diagnosis not present

## 2014-06-13 DIAGNOSIS — Z79810 Long term (current) use of selective estrogen receptor modulators (SERMs): Secondary | ICD-10-CM

## 2014-06-13 LAB — COMPREHENSIVE METABOLIC PANEL (CC13)
ALT: 18 U/L (ref 0–55)
ANION GAP: 8 meq/L (ref 3–11)
AST: 21 U/L (ref 5–34)
Albumin: 3.7 g/dL (ref 3.5–5.0)
Alkaline Phosphatase: 61 U/L (ref 40–150)
BUN: 22.3 mg/dL (ref 7.0–26.0)
CO2: 27 meq/L (ref 22–29)
CREATININE: 0.9 mg/dL (ref 0.6–1.1)
Calcium: 8.7 mg/dL (ref 8.4–10.4)
Chloride: 107 mEq/L (ref 98–109)
EGFR: 68 mL/min/{1.73_m2} — ABNORMAL LOW (ref >90)
Glucose: 100 mg/dl (ref 70–140)
Potassium: 4.5 mEq/L (ref 3.5–5.1)
Sodium: 142 mEq/L (ref 136–145)
Total Bilirubin: 0.54 mg/dL (ref 0.20–1.20)
Total Protein: 6.5 g/dL (ref 6.4–8.3)

## 2014-06-13 LAB — CBC WITH DIFFERENTIAL/PLATELET
BASO%: 0.7 % (ref 0.0–2.0)
BASOS ABS: 0 10*3/uL (ref 0.0–0.1)
EOS%: 0.1 % (ref 0.0–7.0)
Eosinophils Absolute: 0 10*3/uL (ref 0.0–0.5)
HCT: 42.9 % (ref 34.8–46.6)
HGB: 14.1 g/dL (ref 11.6–15.9)
LYMPH%: 27.2 % (ref 14.0–49.7)
MCH: 31 pg (ref 25.1–34.0)
MCHC: 32.9 g/dL (ref 31.5–36.0)
MCV: 94.3 fL (ref 79.5–101.0)
MONO#: 0.5 10*3/uL (ref 0.1–0.9)
MONO%: 8.3 % (ref 0.0–14.0)
NEUT#: 4 10*3/uL (ref 1.5–6.5)
NEUT%: 63.7 % (ref 38.4–76.8)
Platelets: 244 10*3/uL (ref 145–400)
RBC: 4.55 10*6/uL (ref 3.70–5.45)
RDW: 12.4 % (ref 11.2–14.5)
WBC: 6.2 10*3/uL (ref 3.9–10.3)
lymph#: 1.7 10*3/uL (ref 0.9–3.3)

## 2014-06-13 SURGERY — Surgical Case
Anesthesia: *Unknown

## 2014-06-13 NOTE — Progress Notes (Signed)
[place]  [phone] Fax:([phone]) [phone]     ID: Patricia [name] OB: [birth year]  MR#: [id number]  WSF#:[id number]  PCP: Annye Asa, MD GYN:   SU: Stark Klein OTHER MD: Thea Silversmith, Charlena Cross   CHIEF COMPLAINT: left breast cnacer, clinical stage II  TREATMENT: neoadjuvant tamoxifen; awaiting definitive surgery  BREAST CANCER HISTORY: From the initial intake note 06/14/2013:  Patricia [name] had screening mammography with tomography at [facility] 05/26/2013 showing some architectural distortion in the left breast superiorly. Ultrasound of the left breast 06/02/2013 showed a 2 cm irregular mass superiorly in the left breast, medial to the scar on the skin from a prior biopsy. This mass was palpable. 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  On 06/13/2013 the patient underwent bilateral breast MRI. This showed a 2.8 cm irregular enhancing mass in the superior left breast but no additional suspicious masses, and no abnormal adenopathy, and no findings in the right breast.  The patient's subsequent history is as detailed below.  INTERVAL HISTORY: Patricia [name] returns today for followup of her estrogen receptor positive breast cancer. She just returned from her [place] cruise which she enjoyed quite a bit. She continues on tamoxifen. She is not having any hot flashes or vaginal discharge from this medication. She obtains it at an excellent price. She is scheduled for definitive surgery, namely lumpectomy and sentinel lymph node sampling, next week.    REVIEW OF SYSTEMS: She noted mild bilateral ankle swelling while she was in her cruise. She has arthritic pains here and there, not more persistent or intense than prior. She is having some  issues with her thyroid, which her primary care physician is dealing with. Otherwise a detailed review of systems today was noncontributory   PAST MEDICAL HISTORY: Past Medical History  Diagnosis Date  . Cough variant asthma   . Osteopenia   . Stricture and stenosis of cervix   . Postmenopausal   . GERD (gastroesophageal reflux disease)   . Disease of pharynx or nasopharynx   . Elevated BP   . Neoplasm   . Personal history of malignant neoplasm of bronchus and lung   . Allergic rhinitis, cause unspecified   . Diverticulosis   . Internal hemorrhoids   . Hiatal hernia   . Hypertension   . Laryngospasm   . 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 Hyperlipidemia   . Glaucoma   . Laryngospasm   . Multinodular goiter     PAST SURGICAL HISTORY: Past Surgical History  Procedure Laterality Date  . Lung cancer surgery      resection carcinoid lingula  . Breast biopsy    . Lung surgery      carcinoid tumor removed    FAMILY HISTORY Family History  Problem Relation Age of Onset  . Stroke Mother   . Hypertension Mother   . Hyperlipidemia Mother   . Stroke Father   . Transient ischemic attack Father   . Hyperlipidemia Father   . Hypertension Father    the patient's father died from pneumonia the age of 31, in the setting of dementia. The patient's mother died at the age of 40 following a stroke. The patient had one brother, 4 sisters. There is no history of breast or ovarian cancer in the family  GYNECOLOGIC  HISTORY:  Menarche age 31, first live birth age 26, she is Patricia [name] P2. She did not use hormone replacement at menopause. She never took birth control pills.   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 She does a little bit of swelling on the side. Her husband [name] is retired from the [employer] (Designer, fashion/clothing) and then worked for [employer]. He is an avid Animator. Son Patricia [name] lives in [place] and is an Art gallery manager. Daughter Patricia [name]  lives in [place] and is a Astronomer. The patient has 74 grandchildren. She attends the [employer]    ADVANCED DIRECTIVES: In place   HEALTH MAINTENANCE: History  Substance Use Topics  . Smoking status: Former Smoker    Quit date: 06/15/1963  . Smokeless tobacco: Not on file  . Alcohol Use: Yes     Comment: occasional wine     Colonoscopy:  PAP:  Bone density: 07/12/2013 at [facility], T score of -1.8 at the femoral neck  Lipid panel:  Allergies  Allergen Reactions  . Combigan [Brimonidine Tartrate-Timolol]     Eyes itch, reddened  . Sulfonamide Derivatives     Current Outpatient Prescriptions  Medication Sig Dispense Refill  . ADVAIR HFA 230-21 MCG/ACT inhaler INHALE 2 PUFFS INTO THE LUNGS TWICE A DAY 3 Inhaler 2  . albuterol (VENTOLIN HFA) 108 (90 BASE) MCG/ACT inhaler Inhale 2 puffs into the lungs every 4 (four) hours as needed. 1 Inhaler 3  . Aspirin (ADULT ASPIRIN LOW STRENGTH) 81 MG EC tablet Take 81 mg by mouth daily.      . bimatoprost (LUMIGAN) 0.03 % ophthalmic drops 1 drop at bedtime.      . gabapentin (NEURONTIN) 400 MG capsule TAKE 1 CAPSULE TWICE A DAY 180 capsule 1  . levothyroxine (SYNTHROID, LEVOTHROID) 50 MCG tablet TAKE 1 TABLET (50 MCG TOTAL) BY MOUTH DAILY. 90 tablet 1  . loratadine (CLARITIN) 10 MG tablet Take 1 tablet (10 mg total) by mouth daily. 30 tablet 0  . Multiple Vitamins-Iron (MULTIVITAMIN/IRON) TABS Take by mouth daily.      Marland Kitchen omeprazole (PRILOSEC) 20 MG capsule TAKE 1 CAPSULE DAILY 30 MINUTES BEFORE A MEAL 90 capsule 0  . simvastatin (ZOCOR) 40 MG tablet TAKE 1 TABLET AT BEDTIME 90 tablet 1  . tamoxifen (NOLVADEX) 20 MG tablet Take 1 tablet (20 mg total) by mouth daily. 90 tablet 4  . thyroid (ARMOUR) 15 MG tablet Take 1 tablet (15 mg total) by mouth daily. 30 tablet 1  . traMADol (ULTRAM) 50 MG tablet TAKE 1 TABLET BY MOUTH EVERY 6 HOURS AS NEEDED FOR PAIN 45 tablet 0  . valsartan (DIOVAN) 40 MG tablet TAKE 1 TABLET DAILY 90  tablet 1   No current facility-administered medications for this visit.    OBJECTIVE: Middle-aged white woman who appears stated age  74 Vitals:   06/13/14 1121  BP: 150/89  Pulse: 61  Temp: 98 F (36.7 C)  Resp: 18     Body mass index is 29.61 kg/(m^2).    ECOG FS:1 - Symptomatic but completely ambulatory  Sclerae unicteric but the right sclera is slightly injected  Oropharynx clear, teeth in good repair No cervical or supraclavicular adenopathy Lungs no rales or rhonchi Heart regular rate and rhythm, Abd soft, obese, nontender, positive bowel sounds MSK no focal spinal tenderness, no upper extremity edema Neuro: non-focal, well-oriented, positive affect Breasts: The right breast is unremarkable. In the left breast upper outer quadrant therewas a palpable mass which I  can no longer locate  There is no skin involvement. There is no nipple involvement. The left axilla is benign.  LAB RESULTS:  CMP     Component Value Date/Time   NA 142 06/13/2014 1036   NA 139 05/14/2014 1656   K 4.5 06/13/2014 1036   K 3.6 05/14/2014 1656   CL 109 05/14/2014 1656   CO2 27 06/13/2014 1036   CO2 26 05/14/2014 1656   GLUCOSE 100 06/13/2014 1036   GLUCOSE 127* 05/14/2014 1656   GLUCOSE 88 01/13/2006 1012   BUN 22.3 06/13/2014 1036   BUN 19 05/14/2014 1656   CREATININE 0.9 06/13/2014 1036   CREATININE 1.05 05/14/2014 1656   CALCIUM 8.7 06/13/2014 1036   CALCIUM 8.8 05/14/2014 1656   PROT 6.5 06/13/2014 1036   PROT 6.3 05/14/2014 1656   ALBUMIN 3.7 06/13/2014 1036   ALBUMIN 3.8 05/14/2014 1656   AST 21 06/13/2014 1036   AST 20 05/14/2014 1656   ALT 18 06/13/2014 1036   ALT 18 05/14/2014 1656   ALKPHOS 61 06/13/2014 1036   ALKPHOS 51 05/14/2014 1656   BILITOT 0.54 06/13/2014 1036   BILITOT 0.4 05/14/2014 1656   GFRNONAA 71.43 11/28/2009 1015   GFRAA 81 05/20/2007 1015    I No results found for: SPEP  Lab Results  Component Value Date   WBC 6.2 06/13/2014   NEUTROABS 4.0  06/13/2014   HGB 14.1 06/13/2014   HCT 42.9 06/13/2014   MCV 94.3 06/13/2014   PLT 244 06/13/2014      Chemistry      Component Value Date/Time   NA 142 06/13/2014 1036   NA 139 05/14/2014 1656   K 4.5 06/13/2014 1036   K 3.6 05/14/2014 1656   CL 109 05/14/2014 1656   CO2 27 06/13/2014 1036   CO2 26 05/14/2014 1656   BUN 22.3 06/13/2014 1036   BUN 19 05/14/2014 1656   CREATININE 0.9 06/13/2014 1036   CREATININE 1.05 05/14/2014 1656      Component Value Date/Time   CALCIUM 8.7 06/13/2014 1036   CALCIUM 8.8 05/14/2014 1656   ALKPHOS 61 06/13/2014 1036   ALKPHOS 51 05/14/2014 1656   AST 21 06/13/2014 1036   AST 20 05/14/2014 1656   ALT 18 06/13/2014 1036   ALT 18 05/14/2014 1656   BILITOT 0.54 06/13/2014 1036   BILITOT 0.4 05/14/2014 1656       No results found for: LABCA2  No components found for: LABCA125  No results for input(s): INR in the last 168 hours.  Urinalysis    Component Value Date/Time   COLORURINE yellow 05/20/2007 0942   APPEARANCEUR Clear 05/20/2007 0942   LABSPEC <1.005 05/20/2007 0942   PHURINE 7.5 05/20/2007 0942   HGBUR negative 05/20/2007 0942   BILIRUBINUR negative 05/20/2007 0942   UROBILINOGEN 0.2 05/20/2007 0942   NITRITE negative 05/20/2007 0942    STUDIES: CLINICAL DATA: Re-evaluate known left breast malignancy following neoadjuvant therapy.  LABS: BUN and creatinine were obtained on site at [place] at  [address]  Results: BUN 19 mg/dL, Creatinine 1.0 mg/dL.  EXAM: BILATERAL BREAST MRI WITH AND WITHOUT CONTRAST  TECHNIQUE: Multiplanar, multisequence MR images of both breasts were obtained prior to and following the intravenous administration of 15m of MultiHance  THREE-DIMENSIONAL MR IMAGE RENDERING ON INDEPENDENT WORKSTATION:  Three-dimensional MR images were rendered by post-processing of the original MR data on an independent workstation. The three-dimensional MR images were  interpreted, and findings are reported in the  following complete MRI report for this study. Three dimensional images were evaluated at the independent DynaCad workstation  COMPARISON: Previous breast MRI dated 06/13/2013.  FINDINGS: Breast composition: c: Heterogeneous fibroglandular tissue  Background parenchymal enhancement: Mild  Right breast: No suspicious rapidly enhancing masses or abnormal areas of enhancement within the right breast to suggest malignancy.  Left breast: The known irregular left breast mass located at the 12:30 o'clock position shows remarkable decrease in enhancement consistent with central necrosis. A small amount of faint, wispy rim enhancement remains associated with the superior portion of this mass. There are no additional findings.  Lymph nodes: No abnormal appearing lymph nodes.  Ancillary findings: None.  IMPRESSION: Considerable improvement in the enhancement associated with the known malignancy within the superior left breast located at the 12:30 o'clock position. No additional findings.  RECOMMENDATION: Treatment plan  BI-RADS CATEGORY 6: Known biopsy-proven malignancy.   Electronically Signed  By: Patricia [name] M.D.  On: 11/30/2013 11:36   ASSESSMENT: 74 y.o. [place] woman status post left breast biopsy for 08/19/2013 for a clinical T2 N0, stage IIA invasive lobular breast cancer, estrogen receptor positive, progesterone receptor negative, with an MIB-1 of 20%. There was not sufficient tissue for HER-2 testing.  (1) neoadjuvant anastrozole started mid April 2015, discontinued early June 2015 with poor tolerance  (2) tamoxifen started first week in July 2015  (3) osteopenia with a T score of -1.8 at the femoral necks 07/12/2013  (4) definitive surgery tentatively planned for 06/26/2014  (5) radiation to follow surgery as appropriate   PLAN: Zirkelbach is scheduled for her definitive surgery next week. Unless  we have a major surprise from the pathology, the plan is for her not to receive chemotherapy, so the next step after surgery will be radiation and I am making her an appointment with Dr. Pablo Ledger for early May 2 to operationalize that. She will see [name] around the same time just to discuss the surgical results  The plan for now is to continue tamoxifen for 5 years and then consider whether to continue or stop at that point. Because the tamoxifen blood level takes about 4 months to decreased significantly, it would make very little difference of tamoxifen were stopped preop for example so we are discontinuing that drug right through surgery and radiation.  She had many questions regarding what the breast will look like postop, with her reconstruction will be necessary, and so on. Hopefully she will end up with a good cosmetic result, but if not certainly we can place a plastic referral for consideration perhaps of fat "fillings" of any dimples or other areas that might not look as well as she would like.  Sieben has a good understanding of the overall plan. She agrees with it. She knows the goal of treatment in her case is cure. She knows to call for any problems that may develop before her next visit here.      Chauncey Cruel, MD   06/13/2014 11:24 AM

## 2014-06-13 NOTE — Telephone Encounter (Signed)
per pof ot sch pt appt-per pof to [facility] on same day-per [place] Glad she will sch appt-pt aware

## 2014-06-14 ENCOUNTER — Other Ambulatory Visit: Payer: Self-pay | Admitting: Family Medicine

## 2014-06-14 NOTE — Telephone Encounter (Signed)
Med filled.  

## 2014-06-15 ENCOUNTER — Telehealth: Payer: Self-pay | Admitting: General Practice

## 2014-06-15 MED ORDER — TRAMADOL HCL 50 MG PO TABS: ORAL_TABLET | ORAL | Status: DC

## 2014-06-15 NOTE — Telephone Encounter (Signed)
Med filled and faxed.  

## 2014-06-15 NOTE — Telephone Encounter (Signed)
Ok for #45

## 2014-06-15 NOTE — Telephone Encounter (Signed)
Last OV 02/08/14 Tramadol last filled 05/03/14 #45 with 0

## 2014-06-25 ENCOUNTER — Encounter (HOSPITAL_BASED_OUTPATIENT_CLINIC_OR_DEPARTMENT_OTHER): Payer: Self-pay | Admitting: *Deleted

## 2014-06-25 DIAGNOSIS — C50912 Malignant neoplasm of unspecified site of left female breast: Secondary | ICD-10-CM | POA: Diagnosis not present

## 2014-06-25 DIAGNOSIS — Z0000 Encounter for general adult medical examination without abnormal findings: Secondary | ICD-10-CM | POA: Diagnosis not present

## 2014-06-25 NOTE — H&P (Signed)
Patricia [name] Location: [facility] Patient #: [phone] DOB: December 07, 1940 Married / Language: English / Race: White Female  History of Present Illness  Patient words: breast follow up.  The patient is a 74 year old female who presents with breast cancer. Pt is a 74 yo F who has been undergoing neoadjuvant hormonal therapy since last summer. Diagnosis was in June 2015 with cT2N0 Stage IIA invasive lobular carcinoma +/-/qns cancer of the upper outer quadrant of the left female breast. She was started on neoadjuvant anastrazole, but did not tolerate it. She was switched to tamoxifen, and she has been tolerating this better. She had good response on MR in october. She now has no palpable disease.  She is here to discuss definitive surgery wtih goal for cure. She desires breast conservation.    Other Problems Asthma Breast Cancer High blood pressure Hypercholesterolemia Thyroid Disease  Past Surgical History  Breast Biopsy Left. Colon Polyp Removal - Colonoscopy Lung Surgery Left.  Diagnostic Studies History Pap Smear 1-5 years ago  Allergies  Combigan *OPHTHALMIC AGENTS*  Medication History Aspirin EC ('81MG'$  Tablet DR, Oral) Active. Lumigan (0.03% Solution, Ophthalmic) Active. Neurontin ('400MG'$  Capsule, Oral) Active. Synthroid (50MCG Tablet, Oral) Active. Claritin ('10MG'$  Tablet, Oral) Active. PriLOSEC ('20MG'$  Capsule DR, Oral) Active. Zocor ('40MG'$  Tablet, Oral) Active. Nolvadex ('20MG'$  Tablet, Oral) Active. Ultram ('50MG'$  Tablet, Oral) Active. Diovan ('40MG'$  Tablet, Oral) Active. Medications Reconciled  Social History  Alcohol use Occasional alcohol use. Caffeine use Carbonated beverages, Coffee, Tea. No drug use Tobacco use Former smoker.  Family History Cerebrovascular Accident Father, Mother. Heart Disease Mother. Hypertension Mother. Respiratory Condition Brother.  Pregnancy / Birth History  Contraceptive History Oral  contraceptives. Gravida 2 Para 2  Review of Systems  General Present- Appetite Loss, Fatigue and Weight Gain. Not Present- Chills, Fever, Night Sweats and Weight Loss. Skin Not Present- Change in Wart/Mole, Dryness, Hives, Jaundice, New Lesions, Non-Healing Wounds, Rash and Ulcer. HEENT Present- Seasonal Allergies. Not Present- Earache, Hearing Loss, Hoarseness, Nose Bleed, Oral Ulcers, Ringing in the Ears, Sinus Pain, Sore Throat, Visual Disturbances, Wears glasses/contact lenses and Yellow Eyes. Cardiovascular Present- Swelling of Extremities. Not Present- Chest Pain, Difficulty Breathing Lying Down, Leg Cramps, Palpitations, Rapid Heart Rate and Shortness of Breath. Female Genitourinary Present- Nocturia. Not Present- Frequency, Painful Urination, Pelvic Pain and Urgency. Musculoskeletal Present- Joint Pain. Not Present- Back Pain, Joint Stiffness, Muscle Pain, Muscle Weakness and Swelling of Extremities. Neurological Not Present- Decreased Memory, Fainting, Headaches, Numbness, Seizures, Tingling, Tremor, Trouble walking and Weakness. Endocrine Present- Hair Changes. Not Present- Cold Intolerance, Excessive Hunger, Heat Intolerance, Hot flashes and New Diabetes.   Vitals Wt Readings from Last 3 Encounters:  06/13/14 66.543 kg (146 lb 11.2 oz)  06/25/14 65.772 kg (145 lb)  05/14/14 65.942 kg (145 lb 6 oz)   Temp Readings from Last 3 Encounters:  06/13/14 98 F (36.7 C) Oral  05/14/14 98.3 F (36.8 C) Oral  03/15/14 98.1 F (36.7 C) Oral   BP Readings from Last 3 Encounters:  06/13/14 150/89  05/14/14 124/78  03/15/14 114/63   Pulse Readings from Last 3 Encounters:  06/13/14 61  05/14/14 76  03/15/14 77    Physical Exam General Mental Status-Alert. General Appearance-Consistent with stated age. Hydration-Well hydrated. Voice-Normal.  Head and Neck Head-normocephalic, atraumatic with no lesions or palpable masses.  Eye Sclera/Conjunctiva -  Bilateral-No scleral icterus.  Chest and Lung Exam Chest and lung exam reveals -quiet, even and easy respiratory effort with no use of accessory muscles. Inspection  Chest Wall - Normal. Back - normal.  Breast Note: no palpable mass or lymphadenopathy. no nipple retraction, discharge, or skin dimpling.   Cardiovascular Cardiovascular examination reveals -normal pedal pulses bilaterally. Note: regular rate and rhythm  Abdomen Inspection-Inspection Normal. Palpation/Percussion Palpation and Percussion of the abdomen reveal - Soft, Non Tender, No Rebound tenderness, No Rigidity (guarding) and No hepatosplenomegaly.  Peripheral Vascular Upper Extremity Inspection - Bilateral - Normal - No Clubbing, No Cyanosis, No Edema, Pulses Intact. Lower Extremity Palpation - Edema - Bilateral - No edema.  Neurologic Neurologic evaluation reveals -alert and oriented x 3 with no impairment of recent or remote memory. Mental Status-Normal.  Musculoskeletal Global Assessment -Note: no gross deformities.  Normal Exam - Left-Upper Extremity Strength Normal and Lower Extremity Strength Normal. Normal Exam - Right-Upper Extremity Strength Normal and Lower Extremity Strength Normal.  Lymphatic Head & Neck  General Head & Neck Lymphatics: Bilateral - Description - Normal. Axillary  General Axillary Region: Bilateral - Description - Normal. Tenderness - Non Tender.    Assessment & Plan PRIMARY CANCER OF UPPER OUTER QUADRANT OF LEFT FEMALE BREAST (174.4  C50.412) Impression: Pt is ready for surgery. We discussed the process for seed localized lumpectomy and SLN bx. The surgical procedure was described to the patient. I discussed the incision type and location and that we would need radiology involved on with a wire or seed marker and/or sentinel node.  The risks and benefits of the procedure were described to the patient and she wishes to proceed.  We discussed the risks  bleeding, infection, damage to other structures, need for further procedures/surgeries. We discussed the risk of seroma. The patient was advised if the area in the breast in cancer, we may need to go back to surgery for additional tissue to obtain negative margins or for additional lymph nodes. The patient was advised that these are the most common complications, but that others can occur as well. She were advised against taking aspirin or other anti-inflammatory agents/blood thinners the week before surgery. Current Plans  Schedule for Surgery Pt Education - flb breast cancer surgery: discussed with patient and provided information.

## 2014-06-25 NOTE — Progress Notes (Signed)
Labs and ekg done

## 2014-06-26 ENCOUNTER — Encounter (HOSPITAL_COMMUNITY)
Admission: RE | Admit: 2014-06-26 | Discharge: 2014-06-26 | Disposition: A | Discharge status: Home / Self Care | Payer: Medicare Other | Source: Ambulatory Visit | Attending: General Surgery | Admitting: General Surgery

## 2014-06-26 ENCOUNTER — Ambulatory Visit (HOSPITAL_BASED_OUTPATIENT_CLINIC_OR_DEPARTMENT_OTHER): Payer: Medicare Other | Admitting: Anesthesiology

## 2014-06-26 ENCOUNTER — Ambulatory Visit (HOSPITAL_BASED_OUTPATIENT_CLINIC_OR_DEPARTMENT_OTHER)
Admission: RE | Admit: 2014-06-26 | Discharge: 2014-06-26 | Disposition: A | Discharge status: Home / Self Care | Payer: Medicare Other | Source: Ambulatory Visit | Attending: General Surgery | Admitting: General Surgery

## 2014-06-26 ENCOUNTER — Encounter (HOSPITAL_BASED_OUTPATIENT_CLINIC_OR_DEPARTMENT_OTHER): Payer: Self-pay | Admitting: *Deleted

## 2014-06-26 ENCOUNTER — Encounter (HOSPITAL_BASED_OUTPATIENT_CLINIC_OR_DEPARTMENT_OTHER)
Admission: RE | Disposition: A | Discharge status: Home / Self Care | Payer: Self-pay | Source: Ambulatory Visit | Attending: General Surgery

## 2014-06-26 DIAGNOSIS — E780 Pure hypercholesterolemia: Secondary | ICD-10-CM | POA: Insufficient documentation

## 2014-06-26 DIAGNOSIS — Z87891 Personal history of nicotine dependence: Secondary | ICD-10-CM | POA: Insufficient documentation

## 2014-06-26 DIAGNOSIS — Z79891 Long term (current) use of opiate analgesic: Secondary | ICD-10-CM | POA: Diagnosis not present

## 2014-06-26 DIAGNOSIS — E079 Disorder of thyroid, unspecified: Secondary | ICD-10-CM | POA: Diagnosis not present

## 2014-06-26 DIAGNOSIS — N6012 Diffuse cystic mastopathy of left breast: Secondary | ICD-10-CM | POA: Diagnosis not present

## 2014-06-26 DIAGNOSIS — R079 Chest pain, unspecified: Secondary | ICD-10-CM | POA: Diagnosis not present

## 2014-06-26 DIAGNOSIS — Z0000 Encounter for general adult medical examination without abnormal findings: Secondary | ICD-10-CM | POA: Diagnosis not present

## 2014-06-26 DIAGNOSIS — Z7982 Long term (current) use of aspirin: Secondary | ICD-10-CM | POA: Diagnosis not present

## 2014-06-26 DIAGNOSIS — N6092 Unspecified benign mammary dysplasia of left breast: Secondary | ICD-10-CM | POA: Insufficient documentation

## 2014-06-26 DIAGNOSIS — C50412 Malignant neoplasm of upper-outer quadrant of left female breast: Secondary | ICD-10-CM | POA: Insufficient documentation

## 2014-06-26 DIAGNOSIS — R920 Mammographic microcalcification found on diagnostic imaging of breast: Secondary | ICD-10-CM | POA: Diagnosis not present

## 2014-06-26 DIAGNOSIS — G8918 Other acute postprocedural pain: Secondary | ICD-10-CM | POA: Diagnosis not present

## 2014-06-26 DIAGNOSIS — J45909 Unspecified asthma, uncomplicated: Secondary | ICD-10-CM | POA: Insufficient documentation

## 2014-06-26 DIAGNOSIS — I10 Essential (primary) hypertension: Secondary | ICD-10-CM | POA: Diagnosis not present

## 2014-06-26 DIAGNOSIS — N6022 Fibroadenosis of left breast: Secondary | ICD-10-CM | POA: Diagnosis not present

## 2014-06-26 DIAGNOSIS — Z79899 Other long term (current) drug therapy: Secondary | ICD-10-CM | POA: Insufficient documentation

## 2014-06-26 DIAGNOSIS — C50912 Malignant neoplasm of unspecified site of left female breast: Secondary | ICD-10-CM | POA: Diagnosis not present

## 2014-06-26 DIAGNOSIS — Z170 Estrogen receptor positive status [ER+]: Secondary | ICD-10-CM | POA: Diagnosis not present

## 2014-06-26 HISTORY — PX: RADIOACTIVE SEED GUIDED PARTIAL MASTECTOMY WITH AXILLARY SENTINEL LYMPH NODE BIOPSY: SHX6520

## 2014-06-26 HISTORY — DX: Chronic cough: R05.3

## 2014-06-26 HISTORY — DX: Cough: R05

## 2014-06-26 SURGERY — RADIOACTIVE SEED GUIDED PARTIAL MASTECTOMY WITH AXILLARY SENTINEL LYMPH NODE BIOPSY
Anesthesia: General | Site: Breast | Laterality: Left

## 2014-06-26 MED ORDER — EPHEDRINE SULFATE 50 MG/ML IJ SOLN
INTRAMUSCULAR | Status: DC | PRN
  Administered 2014-06-26: 10 mg via INTRAVENOUS
  Administered 2014-06-26: 15 mg via INTRAVENOUS
  Administered 2014-06-26: 10 mg via INTRAVENOUS

## 2014-06-26 MED ORDER — ONDANSETRON HCL 4 MG/2ML IJ SOLN
INTRAMUSCULAR | Status: DC | PRN
  Administered 2014-06-26: 4 mg via INTRAVENOUS

## 2014-06-26 MED ORDER — BUPIVACAINE-EPINEPHRINE (PF) 0.25% -1:200000 IJ SOLN
INTRAMUSCULAR | Status: AC
  Filled 2014-06-26: qty 30

## 2014-06-26 MED ORDER — CEFAZOLIN SODIUM-DEXTROSE 2-3 GM-% IV SOLR
2.0000 g | INTRAVENOUS | Status: AC
  Administered 2014-06-26: 2 g via INTRAVENOUS

## 2014-06-26 MED ORDER — MIDAZOLAM HCL 2 MG/2ML IJ SOLN
INTRAMUSCULAR | Status: AC
  Filled 2014-06-26: qty 2

## 2014-06-26 MED ORDER — FENTANYL CITRATE (PF) 100 MCG/2ML IJ SOLN
INTRAMUSCULAR | Status: AC
  Filled 2014-06-26: qty 2

## 2014-06-26 MED ORDER — FENTANYL CITRATE (PF) 100 MCG/2ML IJ SOLN
50.0000 ug | INTRAMUSCULAR | Status: DC | PRN
  Administered 2014-06-26: 50 ug via INTRAVENOUS

## 2014-06-26 MED ORDER — TECHNETIUM TC 99M SULFUR COLLOID FILTERED
1.0000 | Freq: Once | INTRAVENOUS | Status: AC | PRN
  Administered 2014-06-26: 1 via INTRADERMAL

## 2014-06-26 MED ORDER — PROPOFOL 500 MG/50ML IV EMUL
INTRAVENOUS | Status: AC
  Filled 2014-06-26: qty 100

## 2014-06-26 MED ORDER — PROPOFOL 10 MG/ML IV BOLUS
INTRAVENOUS | Status: DC | PRN
  Administered 2014-06-26: 180 mg via INTRAVENOUS

## 2014-06-26 MED ORDER — ONDANSETRON HCL 4 MG/2ML IJ SOLN: 4.0000 mg | Freq: Once | INTRAMUSCULAR | Status: DC | PRN

## 2014-06-26 MED ORDER — FENTANYL CITRATE (PF) 100 MCG/2ML IJ SOLN
INTRAMUSCULAR | Status: DC | PRN
  Administered 2014-06-26: 75 ug via INTRAVENOUS

## 2014-06-26 MED ORDER — LIDOCAINE-EPINEPHRINE (PF) 1 %-1:200000 IJ SOLN
INTRAMUSCULAR | Status: AC
  Filled 2014-06-26: qty 10

## 2014-06-26 MED ORDER — HYDROMORPHONE HCL 1 MG/ML IJ SOLN: 0.2500 mg | INTRAMUSCULAR | Status: DC | PRN

## 2014-06-26 MED ORDER — OXYCODONE HCL 5 MG/5ML PO SOLN: 5.0000 mg | Freq: Once | ORAL | Status: DC | PRN

## 2014-06-26 MED ORDER — LACTATED RINGERS IV SOLN
INTRAVENOUS | Status: DC
Start: 2014-06-26 — End: 2014-06-26
  Administered 2014-06-26 (×3): via INTRAVENOUS

## 2014-06-26 MED ORDER — SUCCINYLCHOLINE CHLORIDE 20 MG/ML IJ SOLN
INTRAMUSCULAR | Status: AC
  Filled 2014-06-26: qty 1

## 2014-06-26 MED ORDER — GLYCOPYRROLATE 0.2 MG/ML IJ SOLN: 0.2000 mg | Freq: Once | INTRAMUSCULAR | Status: DC | PRN

## 2014-06-26 MED ORDER — LIDOCAINE-EPINEPHRINE (PF) 1 %-1:200000 IJ SOLN
INTRAMUSCULAR | Status: DC | PRN
  Administered 2014-06-26: 30 mL

## 2014-06-26 MED ORDER — SODIUM CHLORIDE 0.9 % IJ SOLN
INTRAMUSCULAR | Status: AC
  Filled 2014-06-26: qty 10

## 2014-06-26 MED ORDER — METHYLENE BLUE 1 % INJ SOLN
INTRAMUSCULAR | Status: AC
  Filled 2014-06-26: qty 10

## 2014-06-26 MED ORDER — LIDOCAINE HCL (CARDIAC) 20 MG/ML IV SOLN
INTRAVENOUS | Status: DC | PRN
  Administered 2014-06-26: 50 mg via INTRAVENOUS

## 2014-06-26 MED ORDER — DEXAMETHASONE SODIUM PHOSPHATE 4 MG/ML IJ SOLN
INTRAMUSCULAR | Status: DC | PRN
  Administered 2014-06-26: 10 mg via INTRAVENOUS

## 2014-06-26 MED ORDER — OXYCODONE-ACETAMINOPHEN 5-325 MG PO TABS: 1.0000 | ORAL_TABLET | ORAL | Status: DC | PRN

## 2014-06-26 MED ORDER — BUPIVACAINE HCL (PF) 0.25 % IJ SOLN
INTRAMUSCULAR | Status: AC
  Filled 2014-06-26: qty 30

## 2014-06-26 MED ORDER — CEFAZOLIN SODIUM-DEXTROSE 2-3 GM-% IV SOLR
INTRAVENOUS | Status: AC
  Filled 2014-06-26: qty 50

## 2014-06-26 MED ORDER — MIDAZOLAM HCL 2 MG/2ML IJ SOLN
1.0000 mg | INTRAMUSCULAR | Status: DC | PRN
  Administered 2014-06-26: 1 mg via INTRAVENOUS

## 2014-06-26 MED ORDER — FENTANYL CITRATE (PF) 100 MCG/2ML IJ SOLN
INTRAMUSCULAR | Status: AC
  Filled 2014-06-26: qty 6

## 2014-06-26 MED ORDER — OXYCODONE HCL 5 MG PO TABS: 5.0000 mg | ORAL_TABLET | Freq: Once | ORAL | Status: DC | PRN

## 2014-06-26 SURGICAL SUPPLY — 66 items
APPLIER CLIP 9.375 MED OPEN (MISCELLANEOUS)
BINDER BREAST LRG (GAUZE/BANDAGES/DRESSINGS) IMPLANT
BINDER BREAST MEDIUM (GAUZE/BANDAGES/DRESSINGS) ×3 IMPLANT
BINDER BREAST XLRG (GAUZE/BANDAGES/DRESSINGS) IMPLANT
BINDER BREAST XXLRG (GAUZE/BANDAGES/DRESSINGS) IMPLANT
BLADE HEX COATED 2.75 (ELECTRODE) ×3 IMPLANT
BLADE SURG 10 STRL SS (BLADE) ×3 IMPLANT
BLADE SURG 15 STRL LF DISP TIS (BLADE) ×1 IMPLANT
BLADE SURG 15 STRL SS (BLADE) ×2
BNDG COHESIVE 4X5 TAN STRL (GAUZE/BANDAGES/DRESSINGS) ×3 IMPLANT
CANISTER SUC SOCK COL 7IN (MISCELLANEOUS) IMPLANT
CANISTER SUCT 1200ML W/VALVE (MISCELLANEOUS) ×3 IMPLANT
CHLORAPREP W/TINT 26ML (MISCELLANEOUS) ×3 IMPLANT
CLIP APPLIE 9.375 MED OPEN (MISCELLANEOUS) IMPLANT
CLIP TI LARGE 6 (CLIP) ×6 IMPLANT
CLIP TI MEDIUM 6 (CLIP) ×6 IMPLANT
CLIP TI WIDE RED SMALL 6 (CLIP) IMPLANT
CLOSURE WOUND 1/2 X4 (GAUZE/BANDAGES/DRESSINGS) ×1
COVER MAYO STAND STRL (DRAPES) ×3 IMPLANT
COVER PROBE W GEL 5X96 (DRAPES) ×3 IMPLANT
DECANTER SPIKE VIAL GLASS SM (MISCELLANEOUS) IMPLANT
DEVICE DUBIN W/COMP PLATE 8390 (MISCELLANEOUS) ×3 IMPLANT
DRAPE UTILITY XL STRL (DRAPES) ×3 IMPLANT
DRSG PAD ABDOMINAL 8X10 ST (GAUZE/BANDAGES/DRESSINGS) ×3 IMPLANT
ELECT REM PT RETURN 9FT ADLT (ELECTROSURGICAL) ×3
ELECTRODE REM PT RTRN 9FT ADLT (ELECTROSURGICAL) ×1 IMPLANT
GLOVE BIO SURGEON STRL SZ 6 (GLOVE) ×3 IMPLANT
GLOVE BIO SURGEON STRL SZ7 (GLOVE) ×3 IMPLANT
GLOVE BIOGEL PI IND STRL 6.5 (GLOVE) ×1 IMPLANT
GLOVE BIOGEL PI IND STRL 7.0 (GLOVE) ×3 IMPLANT
GLOVE BIOGEL PI INDICATOR 6.5 (GLOVE) ×2
GLOVE BIOGEL PI INDICATOR 7.0 (GLOVE) ×6
GLOVE ECLIPSE 6.5 STRL STRAW (GLOVE) ×3 IMPLANT
GLOVE EXAM NITRILE EXT CUFF MD (GLOVE) ×3 IMPLANT
GLOVE SURG SS PI 7.0 STRL IVOR (GLOVE) ×3 IMPLANT
GOWN STRL REUS W/ TWL LRG LVL3 (GOWN DISPOSABLE) ×3 IMPLANT
GOWN STRL REUS W/TWL 2XL LVL3 (GOWN DISPOSABLE) ×3 IMPLANT
GOWN STRL REUS W/TWL LRG LVL3 (GOWN DISPOSABLE) ×9
KIT MARKER MARGIN INK (KITS) ×3 IMPLANT
LIQUID BAND (GAUZE/BANDAGES/DRESSINGS) ×3 IMPLANT
NEEDLE HYPO 25X1 1.5 SAFETY (NEEDLE) ×3 IMPLANT
NS IRRIG 1000ML POUR BTL (IV SOLUTION) ×3 IMPLANT
PACK BASIN DAY SURGERY FS (CUSTOM PROCEDURE TRAY) ×3 IMPLANT
PACK UNIVERSAL I (CUSTOM PROCEDURE TRAY) ×3 IMPLANT
PENCIL BUTTON HOLSTER BLD 10FT (ELECTRODE) ×3 IMPLANT
SLEEVE SCD COMPRESS KNEE MED (MISCELLANEOUS) ×3 IMPLANT
SPONGE GAUZE 4X4 12PLY STER LF (GAUZE/BANDAGES/DRESSINGS) ×3 IMPLANT
SPONGE LAP 18X18 X RAY DECT (DISPOSABLE) ×3 IMPLANT
STAPLER VISISTAT 35W (STAPLE) IMPLANT
STOCKINETTE IMPERVIOUS LG (DRAPES) ×3 IMPLANT
STRIP CLOSURE SKIN 1/2X4 (GAUZE/BANDAGES/DRESSINGS) ×2 IMPLANT
SUT ETHILON 2 0 FS 18 (SUTURE) IMPLANT
SUT MNCRL AB 4-0 PS2 18 (SUTURE) ×6 IMPLANT
SUT MON AB 5-0 PS2 18 (SUTURE) IMPLANT
SUT SILK 2 0 SH (SUTURE) IMPLANT
SUT VIC AB 2-0 SH 27 (SUTURE) ×3
SUT VIC AB 2-0 SH 27XBRD (SUTURE) ×1 IMPLANT
SUT VIC AB 3-0 SH 27 (SUTURE) ×6
SUT VIC AB 3-0 SH 27X BRD (SUTURE) ×2 IMPLANT
SUT VIC AB 5-0 PS2 18 (SUTURE) IMPLANT
SYR CONTROL 10ML LL (SYRINGE) ×3 IMPLANT
TOWEL OR 17X24 6PK STRL BLUE (TOWEL DISPOSABLE) ×3 IMPLANT
TOWEL OR NON WOVEN STRL DISP B (DISPOSABLE) IMPLANT
TUBE CONNECTING 20'X1/4 (TUBING) ×1
TUBE CONNECTING 20X1/4 (TUBING) ×2 IMPLANT
YANKAUER SUCT BULB TIP NO VENT (SUCTIONS) ×3 IMPLANT

## 2014-06-26 NOTE — Transfer of Care (Signed)
Immediate Anesthesia Transfer of Care Note  Patient: Patricia [name]  Procedure(s) Performed: Procedure(s): RADIOACTIVE SEED GUIDED PARTIAL MASTECTOMY WITH AXILLARY SENTINEL LYMPH NODE BIOPSY (Left)  Patient Location: PACU  Anesthesia Type:General  Level of Consciousness: awake, alert  and oriented  Airway & Oxygen Therapy: Patient Spontanous Breathing and Patient connected to face mask oxygen  Post-op Assessment: Report given to RN and Post -op Vital signs reviewed and stable  Post vital signs: Reviewed and stable  Last Vitals:  Filed Vitals:   06/26/14 0725  BP:   Pulse: 51  Temp:   Resp: 13    Complications: No apparent anesthesia complications

## 2014-06-26 NOTE — Anesthesia Procedure Notes (Addendum)
Anesthesia Regional Block:  Pectoralis block  Pre-Anesthetic Checklist: ,, timeout performed, Correct Patient, Correct Site, Correct Laterality, Correct Procedure, Correct Position, site marked, Risks and benefits discussed,  Surgical consent,  Pre-op evaluation,  At surgeon's request and post-op pain management  Laterality: Left  Prep: chloraprep       Needles:  Injection technique: Single-shot  Needle Type: Echogenic Stimulator Needle     Needle Length: 9cm 9 cm Needle Gauge: 22 and 22 G    Additional Needles:  Procedures: ultrasound guided (picture in chart) Pectoralis block Narrative:  Start time: 06/26/2014 7:15 AM End time: 06/26/2014 7:20 AM Injection made incrementally with aspirations every 5 mL.  Performed by: Personally   Additional Notes: 30 cc 0.5% bupivacaine wth 1:200 Epi injected easily   Procedure Name: LMA Insertion Date/Time: 06/26/2014 7:44 AM Performed by: Melynda Ripple D Pre-anesthesia Checklist: Patient identified, Emergency Drugs available, Suction available and Patient being monitored Patient Re-evaluated:Patient Re-evaluated prior to inductionOxygen Delivery Method: Circle System Utilized Preoxygenation: Pre-oxygenation with 100% oxygen Intubation Type: IV induction Ventilation: Mask ventilation without difficulty LMA: LMA inserted LMA Size: 4.0 Number of attempts: 1 Airway Equipment and Method: Bite block Placement Confirmation: positive ETCO2 Tube secured with: Tape Dental Injury: Teeth and Oropharynx as per pre-operative assessment

## 2014-06-26 NOTE — Progress Notes (Signed)
Assisted Dr. Joslin with left, ultrasound guided, pectoralis block. Side rails up, monitors on throughout procedure. See vital signs in flow sheet. Tolerated Procedure well. 

## 2014-06-26 NOTE — Anesthesia Preprocedure Evaluation (Signed)
Anesthesia Evaluation  Patient identified by MRN, date of birth, ID band Patient awake    Reviewed: Allergy & Precautions, NPO status , Patient's Chart, lab work & pertinent test results  Airway Mallampati: II  TM Distance: >3 FB Neck ROM: Full    Dental  (+) Teeth Intact, Dental Advisory Given   Pulmonary former smoker,  breath sounds clear to auscultation        Cardiovascular hypertension, Rhythm:Regular Rate:Normal     Neuro/Psych    GI/Hepatic   Endo/Other    Renal/GU      Musculoskeletal   Abdominal   Peds  Hematology   Anesthesia Other Findings   Reproductive/Obstetrics                             Anesthesia Physical Anesthesia Plan  ASA: III  Anesthesia Plan: General   Post-op Pain Management:    Induction: Intravenous  Airway Management Planned: LMA  Additional Equipment:   Intra-op Plan:   Post-operative Plan:   Informed Consent: I have reviewed the patients History and Physical, chart, labs and discussed the procedure including the risks, benefits and alternatives for the proposed anesthesia with the patient or authorized representative who has indicated his/her understanding and acceptance.   Dental advisory given  Plan Discussed with: CRNA and Anesthesiologist  Anesthesia Plan Comments:         Anesthesia Quick Evaluation

## 2014-06-26 NOTE — Discharge Instructions (Addendum)
[facility] °Office Phone Number [phone] ° °BREAST BIOPSY/ PARTIAL MASTECTOMY: POST OP INSTRUCTIONS ° °Always review your discharge instruction sheet given to you by the facility where your surgery was performed. ° °IF YOU HAVE DISABILITY OR FAMILY LEAVE FORMS, YOU MUST BRING THEM TO THE OFFICE FOR PROCESSING.  DO NOT GIVE THEM TO YOUR DOCTOR. ° °1. A prescription for pain medication may be given to you upon discharge.  Take your pain medication as prescribed, if needed.  If narcotic pain medicine is not needed, then you may take acetaminophen (Tylenol) or ibuprofen (Advil) as needed. °2. Take your usually prescribed medications unless otherwise directed °3. If you need a refill on your pain medication, please contact your pharmacy.  They will contact our office to request authorization.  Prescriptions will not be filled after 5pm or on week-ends. °4. You should eat very light the first 24 hours after surgery, such as soup, crackers, pudding, etc.  Resume your normal diet the day after surgery. °5. Most patients will experience some swelling and bruising in the breast.  Ice packs and a good support bra will help.  Swelling and bruising can take several days to resolve.  °6. It is common to experience some constipation if taking pain medication after surgery.  Increasing fluid intake and taking a stool softener will usually help or prevent this problem from occurring.  A mild laxative (Milk of Magnesia or Miralax) should be taken according to package directions if there are no bowel movements after 48 hours. °7. Unless discharge instructions indicate otherwise, you may remove your bandages 48 hours after surgery, and you may shower at that time.  You may have steri-strips (small skin tapes) in place directly over the incision.  These strips should be left on the skin for 7-10 days.   Any sutures or staples will be removed at the office during your follow-up visit. °8. ACTIVITIES:  You may resume  regular daily activities (gradually increasing) beginning the next day.  Wearing a good support bra or sports bra (or the breast binder) minimizes pain and swelling.  You may have sexual intercourse when it is comfortable. °a. You may drive when you no longer are taking prescription pain medication, you can comfortably wear a seatbelt, and you can safely maneuver your car and apply brakes. °b. RETURN TO WORK:  __________1 week_______________ °9. You should see your doctor in the office for a follow-up appointment approximately two weeks after your surgery.  Your doctor’s nurse will typically make your follow-up appointment when she calls you with your pathology report.  Expect your pathology report 2-3 business days after your surgery.  You may call to check if you do not hear from us after three days. ° ° °WHEN TO CALL YOUR DOCTOR: °1. Fever over 101.0 °2. Nausea and/or vomiting. °3. Extreme swelling or bruising. °4. Continued bleeding from incision. °5. Increased pain, redness, or drainage from the incision. ° °The clinic staff is available to answer your questions during regular business hours.  Please don’t hesitate to call and ask to speak to one of the nurses for clinical concerns.  If you have a medical emergency, go to the nearest emergency room or call 911.  A surgeon from [facility] is always on call at the hospital. ° °For further questions, please visit [email or web address]  ° ° °Post Anesthesia Home Care Instructions ° °Activity: °Get plenty of rest for the remainder of the day. A responsible adult should stay with you for 24   hours following the procedure.  °For the next 24 hours, DO NOT: °-Drive a car °-Operate machinery °-Drink alcoholic beverages °-Take any medication unless instructed by your physician °-Make any legal decisions or sign important papers. ° °Meals: °Start with liquid foods such as gelatin or soup. Progress to regular foods as tolerated. Avoid greasy, spicy, heavy  foods. If nausea and/or vomiting occur, drink only clear liquids until the nausea and/or vomiting subsides. Call your physician if vomiting continues. ° °Special Instructions/Symptoms: °Your throat may feel dry or sore from the anesthesia or the breathing tube placed in your throat during surgery. If this causes discomfort, gargle with warm salt water. The discomfort should disappear within 24 hours. ° °If you had a scopolamine patch placed behind your ear for the management of post- operative nausea and/or vomiting: ° °1. The medication in the patch is effective for 72 hours, after which it should be removed.  Wrap patch in a tissue and discard in the trash. Wash hands thoroughly with soap and water. °2. You may remove the patch earlier than 72 hours if you experience unpleasant side effects which may include dry mouth, dizziness or visual disturbances. °3. Avoid touching the patch. Wash your hands with soap and water after contact with the patch. °  ° °

## 2014-06-26 NOTE — Interval H&P Note (Signed)
History and Physical Interval Note:  06/26/2014 7:25 AM  Patricia [name]  has presented today for surgery, with the diagnosis of LEFT BREAST CANCER  The various methods of treatment have been discussed with the patient and family. After consideration of risks, benefits and other options for treatment, the patient has consented to  Procedure(s): RADIOACTIVE SEED GUIDED PARTIAL MASTECTOMY WITH AXILLARY SENTINEL LYMPH NODE BIOPSY (Left) as a surgical intervention .  The patient's history has been reviewed, patient examined, no change in status, stable for surgery.  I have reviewed the patient's chart and labs.  Questions were answered to the patient's satisfaction.     Mauri Tolen

## 2014-06-26 NOTE — Anesthesia Postprocedure Evaluation (Signed)
  Anesthesia Post-op Note  Patient: Patricia [name]  Procedure(s) Performed: Procedure(s): RADIOACTIVE SEED GUIDED PARTIAL MASTECTOMY WITH AXILLARY SENTINEL LYMPH NODE BIOPSY (Left)  Patient Location: PACU  Anesthesia Type:GA combined with regional for post-op pain  Level of Consciousness: awake and alert   Airway and Oxygen Therapy: Patient Spontanous Breathing  Post-op Pain: mild  Post-op Assessment: Post-op Vital signs reviewed  Post-op Vital Signs: Reviewed  Last Vitals:  Filed Vitals:   06/26/14 1101  BP: 123/65  Pulse: 79  Temp: 36.1 C  Resp: 18    Complications: No apparent anesthesia complications

## 2014-06-26 NOTE — Op Note (Signed)
Left Breast Radioactive seed localized lumpectomy and sentinel lymph node biopsy  Indications: This patient presents with history of left breast cancer, upper outer quadrant, cT2N0M0, s/p neoadjuvant chemo (aborted), followed by neoadjuvant tamoxifen  Pre-operative Diagnosis: left breast cancer  Post-operative Diagnosis: same  Surgeon: Stark Klein   Anesthesia: General endotracheal anesthesia  ASA Class: 3  Procedure Details  The patient was seen in the Holding Room. The risks, benefits, complications, treatment options, and expected outcomes were discussed with the patient. The possibilities of bleeding, infection, the need for additional procedures, failure to diagnose a condition, and creating a complication requiring transfusion or operation were discussed with the patient. The patient concurred with the proposed plan, giving informed consent.  The site of surgery properly noted/marked. The patient was taken to Operating Room # 8, identified, and the procedure verified as left Breast seed localized Lumpectomy with sentinel lymph node biopsy. A Time Out was held and the above information confirmed.  The left arm, breast, and chest were prepped and draped in standard fashion. The lumpectomy was performed by creating an transverse incision over the upper otuer quadrant of the breast over the previously placed radioactive seed.  Dissection was carried down to around the point of maximum signal intensity. The cautery was used to perform the dissection.  Hemostasis was achieved with cautery. The edges of the cavity were marked with large clips, with one each medial, lateral, inferior and superior, and two clips posteriorly.   The posterior margin is pectoralis.  The specimen was inked with the margin marker paint kit.    Specimen radiography confirmed inclusion of the mammographic lesion, the clip, and the seed.  The background signal in the breast was zero.  The wound was irrigated and closed with  3-0 vicryl in layers and 4-0 monocryl subcuticular suture.    Using a hand-held gamma probe, left axillary sentinel nodes were identified transcutaneously.  An oblique incision was created below the axillary hairline.  Dissection was carried through the clavipectoral fascia.  Two level 2 axillary sentinel nodes were removed.  Counts per second were 890 and 175.    The background count was 0 cps.  The wound was irrigated.  Hemostasis was achieved with cautery.  The axillary incision was closed with a 3-0 vicryl deep dermal interrupted sutures and a 4-0 monocryl subcuticular closure.    Sterile dressings were applied. At the end of the operation, all sponge, instrument, and needle counts were correct.  Findings: grossly clear surgical margins and no adenopathy  Estimated Blood Loss:  min         Specimens: Left breast lumpectomy and two axillary sentinel lymph nodes.             Complications:  None; patient tolerated the procedure well.         Disposition: PACU - hemodynamically stable.         Condition: stable

## 2014-06-27 ENCOUNTER — Encounter (HOSPITAL_BASED_OUTPATIENT_CLINIC_OR_DEPARTMENT_OTHER): Payer: Self-pay | Admitting: General Surgery

## 2014-06-28 NOTE — Anesthesia Postprocedure Evaluation (Signed)
  Anesthesia Post-op Note  Patient: Patricia [name]  Procedure(s) Performed: Procedure(s): RADIOACTIVE SEED GUIDED PARTIAL MASTECTOMY WITH AXILLARY SENTINEL LYMPH NODE BIOPSY (Left)  Patient Location: PACU  Anesthesia Type:General  Level of Consciousness: awake, alert  and oriented  Airway and Oxygen Therapy: Patient Spontanous Breathing  Post-op Pain: none  Post-op Assessment: Post-op Vital signs reviewed, Patient's Cardiovascular Status Stable, Respiratory Function Stable, Patent Airway and Pain level controlled  Post-op Vital Signs: stable  Last Vitals:  Filed Vitals:   06/26/14 1101  BP: 123/65  Pulse: 79  Temp: 36.1 C  Resp: 18    Complications: No apparent anesthesia complications

## 2014-07-02 ENCOUNTER — Other Ambulatory Visit: Payer: Self-pay | Admitting: Nurse Practitioner

## 2014-07-02 ENCOUNTER — Other Ambulatory Visit: Payer: Self-pay | Admitting: Oncology

## 2014-07-03 ENCOUNTER — Telehealth: Payer: Self-pay | Admitting: General Surgery

## 2014-07-03 NOTE — Telephone Encounter (Signed)
Discussed path with patient.  LN negative, one margin positive.  Will plan reexcision.  Reviewed plan with patient.

## 2014-07-04 ENCOUNTER — Telehealth: Payer: Self-pay | Admitting: Oncology

## 2014-07-04 ENCOUNTER — Other Ambulatory Visit (INDEPENDENT_AMBULATORY_CARE_PROVIDER_SITE_OTHER): Payer: Medicare Other

## 2014-07-04 DIAGNOSIS — E032 Hypothyroidism due to medicaments and other exogenous substances: Secondary | ICD-10-CM

## 2014-07-04 NOTE — Telephone Encounter (Signed)
Left message to confirm appointment change for 05/11

## 2014-07-05 ENCOUNTER — Other Ambulatory Visit: Payer: Self-pay | Admitting: General Surgery

## 2014-07-05 LAB — TSH: TSH: 3.66 u[IU]/mL (ref 0.35–4.50)

## 2014-07-09 ENCOUNTER — Other Ambulatory Visit: Payer: Self-pay | Admitting: Oncology

## 2014-07-09 ENCOUNTER — Other Ambulatory Visit: Payer: Self-pay | Admitting: *Deleted

## 2014-07-10 ENCOUNTER — Telehealth: Payer: Self-pay | Admitting: Oncology

## 2014-07-10 NOTE — Telephone Encounter (Signed)
Called and left a message with new appointment per pof   anne

## 2014-07-11 ENCOUNTER — Ambulatory Visit
Admission: RE | Admit: 2014-07-11 | Discharge: 2014-07-11 | Disposition: A | Discharge status: Home / Self Care | Payer: Medicare Other | Source: Ambulatory Visit | Attending: Radiation Oncology | Admitting: Radiation Oncology

## 2014-07-11 ENCOUNTER — Ambulatory Visit: Admission: RE | Admit: 2014-07-11 | Payer: Medicare Other | Source: Ambulatory Visit

## 2014-07-11 ENCOUNTER — Ambulatory Visit: Payer: Medicare Other | Admitting: Oncology

## 2014-07-12 ENCOUNTER — Encounter: Payer: Self-pay | Admitting: *Deleted

## 2014-07-12 ENCOUNTER — Telehealth: Payer: Self-pay | Admitting: Oncology

## 2014-07-12 NOTE — Telephone Encounter (Signed)
Called patient and she is aware of her new follow up   Patricia [name]

## 2014-07-31 ENCOUNTER — Other Ambulatory Visit: Payer: Self-pay | Admitting: Family Medicine

## 2014-07-31 NOTE — Telephone Encounter (Signed)
Med filled.  

## 2014-08-01 ENCOUNTER — Encounter (HOSPITAL_COMMUNITY)
Admission: RE | Admit: 2014-08-01 | Discharge: 2014-08-01 | Disposition: A | Discharge status: Home / Self Care | Payer: Medicare Other | Source: Ambulatory Visit | Attending: General Surgery | Admitting: General Surgery

## 2014-08-01 ENCOUNTER — Encounter (HOSPITAL_COMMUNITY): Payer: Self-pay

## 2014-08-01 DIAGNOSIS — M8580 Other specified disorders of bone density and structure, unspecified site: Secondary | ICD-10-CM | POA: Diagnosis not present

## 2014-08-01 DIAGNOSIS — C50912 Malignant neoplasm of unspecified site of left female breast: Secondary | ICD-10-CM | POA: Diagnosis not present

## 2014-08-01 DIAGNOSIS — Z888 Allergy status to other drugs, medicaments and biological substances status: Secondary | ICD-10-CM | POA: Diagnosis not present

## 2014-08-01 DIAGNOSIS — K5730 Diverticulosis of large intestine without perforation or abscess without bleeding: Secondary | ICD-10-CM | POA: Diagnosis not present

## 2014-08-01 DIAGNOSIS — E0590 Thyrotoxicosis, unspecified without thyrotoxic crisis or storm: Secondary | ICD-10-CM | POA: Diagnosis not present

## 2014-08-01 DIAGNOSIS — Z882 Allergy status to sulfonamides status: Secondary | ICD-10-CM | POA: Diagnosis not present

## 2014-08-01 DIAGNOSIS — K648 Other hemorrhoids: Secondary | ICD-10-CM | POA: Diagnosis not present

## 2014-08-01 DIAGNOSIS — I10 Essential (primary) hypertension: Secondary | ICD-10-CM | POA: Diagnosis not present

## 2014-08-01 DIAGNOSIS — M1990 Unspecified osteoarthritis, unspecified site: Secondary | ICD-10-CM | POA: Diagnosis not present

## 2014-08-01 DIAGNOSIS — Z85110 Personal history of malignant carcinoid tumor of bronchus and lung: Secondary | ICD-10-CM | POA: Diagnosis not present

## 2014-08-01 DIAGNOSIS — E042 Nontoxic multinodular goiter: Secondary | ICD-10-CM | POA: Diagnosis not present

## 2014-08-01 DIAGNOSIS — Z87891 Personal history of nicotine dependence: Secondary | ICD-10-CM | POA: Diagnosis not present

## 2014-08-01 DIAGNOSIS — Z902 Acquired absence of lung [part of]: Secondary | ICD-10-CM | POA: Diagnosis not present

## 2014-08-01 DIAGNOSIS — K449 Diaphragmatic hernia without obstruction or gangrene: Secondary | ICD-10-CM | POA: Diagnosis not present

## 2014-08-01 DIAGNOSIS — Z7982 Long term (current) use of aspirin: Secondary | ICD-10-CM | POA: Diagnosis not present

## 2014-08-01 DIAGNOSIS — Z9012 Acquired absence of left breast and nipple: Secondary | ICD-10-CM | POA: Diagnosis not present

## 2014-08-01 DIAGNOSIS — E785 Hyperlipidemia, unspecified: Secondary | ICD-10-CM | POA: Diagnosis not present

## 2014-08-01 DIAGNOSIS — K219 Gastro-esophageal reflux disease without esophagitis: Secondary | ICD-10-CM | POA: Diagnosis not present

## 2014-08-01 DIAGNOSIS — J45909 Unspecified asthma, uncomplicated: Secondary | ICD-10-CM | POA: Diagnosis not present

## 2014-08-01 HISTORY — DX: Unspecified osteoarthritis, unspecified site: M19.90

## 2014-08-01 LAB — CBC
HCT: 41.4 % (ref 36.0–46.0)
HEMOGLOBIN: 14 g/dL (ref 12.0–15.0)
MCH: 31.6 pg (ref 26.0–34.0)
MCHC: 33.8 g/dL (ref 30.0–36.0)
MCV: 93.5 fL (ref 78.0–100.0)
Platelets: 219 10*3/uL (ref 150–400)
RBC: 4.43 MIL/uL (ref 3.87–5.11)
RDW: 12.4 % (ref 11.5–15.5)
WBC: 6.3 10*3/uL (ref 4.0–10.5)

## 2014-08-01 LAB — BASIC METABOLIC PANEL
ANION GAP: 7 (ref 5–15)
BUN: 15 mg/dL (ref 6–20)
CO2: 25 mmol/L (ref 22–32)
CREATININE: 0.96 mg/dL (ref 0.44–1.00)
Calcium: 8.7 mg/dL — ABNORMAL LOW (ref 8.9–10.3)
Chloride: 108 mmol/L (ref 101–111)
GFR, EST NON AFRICAN AMERICAN: 57 mL/min — AB (ref >60)
GLUCOSE: 106 mg/dL — AB (ref 65–99)
Potassium: 4.2 mmol/L (ref 3.5–5.1)
Sodium: 140 mmol/L (ref 135–145)

## 2014-08-01 NOTE — Pre-Procedure Instructions (Signed)
    Patricia [name]  08/01/2014      [facility] - [place], [place] [address] [place] [address] Phone: [phone] Fax: [phone]  [id number] [address] [place] [address]: [phone]: [phone]   Your procedure is scheduled on Tuesday August 07, 2014 at 11:15 AM .  [facility]  Call this number if you have problems the morning of surgery:  [phone]   Remember:  Do not eat food or drink liquids after midnight.  Take these medicines the morning of surgery with A SIP OF WATER all inhalers, armour thyroid, bimatroprost (Lumigan), gabapentin  (Neurotin), loratadine (Claritin), omeprazole (Prilosec), tamoxifen (Nolvadex), tramadol (ultram).  DO NOT TAKE ASPIRIN AND HERBAL MEDICATIONS five days prior to surgery.    Do not wear jewelry, make-up or nail polish.  Do not wear lotions, powders, or perfumes.  You may wear deodorant.  Do not shave 48 hours prior to surgery.  Men may shave face and neck.  Do not bring valuables to the hospital.  [facility] or [facility].   Please read over the following fact sheets that you were given. Pain Booklet, Coughing and Deep Breathing and Surgical Site Infection Prevention

## 2014-08-03 ENCOUNTER — Encounter (HOSPITAL_COMMUNITY): Payer: Self-pay | Admitting: *Deleted

## 2014-08-06 ENCOUNTER — Other Ambulatory Visit: Payer: Self-pay | Admitting: General Practice

## 2014-08-06 MED ORDER — THYROID 15 MG PO TABS: ORAL_TABLET | ORAL | Status: DC

## 2014-08-07 ENCOUNTER — Encounter (HOSPITAL_BASED_OUTPATIENT_CLINIC_OR_DEPARTMENT_OTHER): Payer: Self-pay | Admitting: Anesthesiology

## 2014-08-07 ENCOUNTER — Ambulatory Visit (HOSPITAL_BASED_OUTPATIENT_CLINIC_OR_DEPARTMENT_OTHER): Payer: Medicare Other | Admitting: Anesthesiology

## 2014-08-07 ENCOUNTER — Ambulatory Visit (HOSPITAL_COMMUNITY)
Admission: RE | Admit: 2014-08-07 | Discharge: 2014-08-07 | Disposition: A | Discharge status: Home / Self Care | Payer: Medicare Other | Source: Ambulatory Visit | Attending: General Surgery | Admitting: General Surgery

## 2014-08-07 ENCOUNTER — Encounter (HOSPITAL_BASED_OUTPATIENT_CLINIC_OR_DEPARTMENT_OTHER)
Admission: RE | Disposition: A | Discharge status: Home / Self Care | Payer: Self-pay | Source: Ambulatory Visit | Attending: General Surgery

## 2014-08-07 DIAGNOSIS — M8580 Other specified disorders of bone density and structure, unspecified site: Secondary | ICD-10-CM | POA: Diagnosis not present

## 2014-08-07 DIAGNOSIS — J45909 Unspecified asthma, uncomplicated: Secondary | ICD-10-CM | POA: Insufficient documentation

## 2014-08-07 DIAGNOSIS — C50912 Malignant neoplasm of unspecified site of left female breast: Secondary | ICD-10-CM | POA: Diagnosis not present

## 2014-08-07 DIAGNOSIS — K449 Diaphragmatic hernia without obstruction or gangrene: Secondary | ICD-10-CM | POA: Insufficient documentation

## 2014-08-07 DIAGNOSIS — E042 Nontoxic multinodular goiter: Secondary | ICD-10-CM | POA: Insufficient documentation

## 2014-08-07 DIAGNOSIS — K648 Other hemorrhoids: Secondary | ICD-10-CM | POA: Insufficient documentation

## 2014-08-07 DIAGNOSIS — Z9012 Acquired absence of left breast and nipple: Secondary | ICD-10-CM | POA: Insufficient documentation

## 2014-08-07 DIAGNOSIS — Z902 Acquired absence of lung [part of]: Secondary | ICD-10-CM | POA: Insufficient documentation

## 2014-08-07 DIAGNOSIS — Z888 Allergy status to other drugs, medicaments and biological substances status: Secondary | ICD-10-CM | POA: Insufficient documentation

## 2014-08-07 DIAGNOSIS — M1990 Unspecified osteoarthritis, unspecified site: Secondary | ICD-10-CM | POA: Insufficient documentation

## 2014-08-07 DIAGNOSIS — E0590 Thyrotoxicosis, unspecified without thyrotoxic crisis or storm: Secondary | ICD-10-CM | POA: Insufficient documentation

## 2014-08-07 DIAGNOSIS — Z7982 Long term (current) use of aspirin: Secondary | ICD-10-CM | POA: Insufficient documentation

## 2014-08-07 DIAGNOSIS — Z882 Allergy status to sulfonamides status: Secondary | ICD-10-CM | POA: Insufficient documentation

## 2014-08-07 DIAGNOSIS — K5730 Diverticulosis of large intestine without perforation or abscess without bleeding: Secondary | ICD-10-CM | POA: Insufficient documentation

## 2014-08-07 DIAGNOSIS — Z87891 Personal history of nicotine dependence: Secondary | ICD-10-CM | POA: Insufficient documentation

## 2014-08-07 DIAGNOSIS — Z85110 Personal history of malignant carcinoid tumor of bronchus and lung: Secondary | ICD-10-CM | POA: Insufficient documentation

## 2014-08-07 DIAGNOSIS — K219 Gastro-esophageal reflux disease without esophagitis: Secondary | ICD-10-CM | POA: Diagnosis not present

## 2014-08-07 DIAGNOSIS — E785 Hyperlipidemia, unspecified: Secondary | ICD-10-CM | POA: Insufficient documentation

## 2014-08-07 DIAGNOSIS — I10 Essential (primary) hypertension: Secondary | ICD-10-CM | POA: Insufficient documentation

## 2014-08-07 HISTORY — PX: RE-EXCISION OF BREAST LUMPECTOMY: SHX6048

## 2014-08-07 SURGERY — EXCISION, LESION, BREAST
Anesthesia: General | Site: Breast | Laterality: Left

## 2014-08-07 MED ORDER — LIDOCAINE HCL (PF) 1 % IJ SOLN
INTRAMUSCULAR | Status: AC
  Filled 2014-08-07: qty 60

## 2014-08-07 MED ORDER — PHENYLEPHRINE HCL 10 MG/ML IJ SOLN
INTRAMUSCULAR | Status: DC | PRN
  Administered 2014-08-07: 80 ug via INTRAVENOUS

## 2014-08-07 MED ORDER — BUPIVACAINE HCL (PF) 0.25 % IJ SOLN
INTRAMUSCULAR | Status: AC
  Filled 2014-08-07: qty 60

## 2014-08-07 MED ORDER — OXYCODONE HCL 5 MG PO TABS
5.0000 mg | ORAL_TABLET | Freq: Once | ORAL | Status: DC | PRN
Start: 2014-08-07 — End: 2014-08-07

## 2014-08-07 MED ORDER — MIDAZOLAM HCL 2 MG/2ML IJ SOLN
INTRAMUSCULAR | Status: AC
  Filled 2014-08-07: qty 2

## 2014-08-07 MED ORDER — EPHEDRINE SULFATE 50 MG/ML IJ SOLN
INTRAMUSCULAR | Status: DC | PRN
  Administered 2014-08-07: 10 mg via INTRAVENOUS

## 2014-08-07 MED ORDER — FENTANYL CITRATE (PF) 100 MCG/2ML IJ SOLN
INTRAMUSCULAR | Status: DC | PRN
  Administered 2014-08-07 (×2): 50 ug via INTRAVENOUS

## 2014-08-07 MED ORDER — DEXAMETHASONE SODIUM PHOSPHATE 4 MG/ML IJ SOLN
INTRAMUSCULAR | Status: DC | PRN
  Administered 2014-08-07: 10 mg via INTRAVENOUS

## 2014-08-07 MED ORDER — BUPIVACAINE-EPINEPHRINE (PF) 0.5% -1:200000 IJ SOLN
INTRAMUSCULAR | Status: AC
  Filled 2014-08-07: qty 30

## 2014-08-07 MED ORDER — MIDAZOLAM HCL 5 MG/5ML IJ SOLN
INTRAMUSCULAR | Status: DC | PRN
  Administered 2014-08-07: 2 mg via INTRAVENOUS

## 2014-08-07 MED ORDER — FENTANYL CITRATE (PF) 100 MCG/2ML IJ SOLN: 25.0000 ug | INTRAMUSCULAR | Status: DC | PRN

## 2014-08-07 MED ORDER — FENTANYL CITRATE (PF) 100 MCG/2ML IJ SOLN
INTRAMUSCULAR | Status: AC
  Filled 2014-08-07: qty 4

## 2014-08-07 MED ORDER — LIDOCAINE HCL 1 % IJ SOLN
INTRAMUSCULAR | Status: DC | PRN
  Administered 2014-08-07: 10 mL

## 2014-08-07 MED ORDER — PROPOFOL 10 MG/ML IV BOLUS
INTRAVENOUS | Status: DC | PRN
  Administered 2014-08-07: 150 mg via INTRAVENOUS

## 2014-08-07 MED ORDER — OXYCODONE HCL 5 MG/5ML PO SOLN: 5.0000 mg | Freq: Once | ORAL | Status: DC | PRN

## 2014-08-07 MED ORDER — GLYCOPYRROLATE 0.2 MG/ML IJ SOLN: 0.2000 mg | Freq: Once | INTRAMUSCULAR | Status: DC | PRN

## 2014-08-07 MED ORDER — MIDAZOLAM HCL 2 MG/2ML IJ SOLN: 1.0000 mg | INTRAMUSCULAR | Status: DC | PRN

## 2014-08-07 MED ORDER — LACTATED RINGERS IV SOLN
INTRAVENOUS | Status: DC
  Administered 2014-08-07 (×2): via INTRAVENOUS

## 2014-08-07 MED ORDER — ONDANSETRON HCL 4 MG/2ML IJ SOLN: 4.0000 mg | Freq: Four times a day (QID) | INTRAMUSCULAR | Status: DC | PRN

## 2014-08-07 MED ORDER — CEFAZOLIN SODIUM-DEXTROSE 2-3 GM-% IV SOLR
2.0000 g | INTRAVENOUS | Status: AC
  Administered 2014-08-07: 2 g via INTRAVENOUS

## 2014-08-07 MED ORDER — FENTANYL CITRATE (PF) 100 MCG/2ML IJ SOLN: 50.0000 ug | INTRAMUSCULAR | Status: DC | PRN

## 2014-08-07 MED ORDER — CEFAZOLIN SODIUM-DEXTROSE 2-3 GM-% IV SOLR
INTRAVENOUS | Status: AC
  Filled 2014-08-07: qty 50

## 2014-08-07 SURGICAL SUPPLY — 58 items
BINDER BREAST 3XL (BIND) IMPLANT
BINDER BREAST LRG (GAUZE/BANDAGES/DRESSINGS) IMPLANT
BINDER BREAST MEDIUM (GAUZE/BANDAGES/DRESSINGS) IMPLANT
BINDER BREAST XLRG (GAUZE/BANDAGES/DRESSINGS) IMPLANT
BINDER BREAST XXLRG (GAUZE/BANDAGES/DRESSINGS) IMPLANT
BLADE HEX COATED 2.75 (ELECTRODE) ×3 IMPLANT
BLADE SURG 15 STRL LF DISP TIS (BLADE) ×1 IMPLANT
BLADE SURG 15 STRL SS (BLADE) ×2
CANISTER SUCT 1200ML W/VALVE (MISCELLANEOUS) ×3 IMPLANT
CHLORAPREP W/TINT 26ML (MISCELLANEOUS) ×3 IMPLANT
CLIP TI LARGE 6 (CLIP) IMPLANT
CLOSURE WOUND 1/2 X4 (GAUZE/BANDAGES/DRESSINGS) ×1
COVER BACK TABLE 60X90IN (DRAPES) ×3 IMPLANT
COVER MAYO STAND STRL (DRAPES) ×3 IMPLANT
DECANTER SPIKE VIAL GLASS SM (MISCELLANEOUS) IMPLANT
DRAPE LAPAROTOMY 100X72 PEDS (DRAPES) ×3 IMPLANT
DRAPE UTILITY XL STRL (DRAPES) ×3 IMPLANT
ELECT REM PT RETURN 9FT ADLT (ELECTROSURGICAL) ×3
ELECTRODE REM PT RTRN 9FT ADLT (ELECTROSURGICAL) ×1 IMPLANT
GAUZE SPONGE 4X4 12PLY STRL (GAUZE/BANDAGES/DRESSINGS) IMPLANT
GLOVE BIO SURGEON STRL SZ 6 (GLOVE) ×3 IMPLANT
GLOVE BIO SURGEON STRL SZ7 (GLOVE) ×3 IMPLANT
GLOVE BIOGEL PI IND STRL 6.5 (GLOVE) ×1 IMPLANT
GLOVE BIOGEL PI IND STRL 7.5 (GLOVE) ×1 IMPLANT
GLOVE BIOGEL PI IND STRL 8 (GLOVE) ×1 IMPLANT
GLOVE BIOGEL PI INDICATOR 6.5 (GLOVE) ×2
GLOVE BIOGEL PI INDICATOR 7.5 (GLOVE) ×2
GLOVE BIOGEL PI INDICATOR 8 (GLOVE) ×2
GLOVE SURG SS PI 7.5 STRL IVOR (GLOVE) ×3 IMPLANT
GOWN STRL REUS W/ TWL LRG LVL3 (GOWN DISPOSABLE) ×1 IMPLANT
GOWN STRL REUS W/TWL 2XL LVL3 (GOWN DISPOSABLE) ×3 IMPLANT
GOWN STRL REUS W/TWL LRG LVL3 (GOWN DISPOSABLE) ×2
KIT MARKER MARGIN INK (KITS) IMPLANT
LIQUID BAND (GAUZE/BANDAGES/DRESSINGS) ×3 IMPLANT
NEEDLE HYPO 25X1 1.5 SAFETY (NEEDLE) ×3 IMPLANT
NS IRRIG 1000ML POUR BTL (IV SOLUTION) ×3 IMPLANT
PACK BASIN DAY SURGERY FS (CUSTOM PROCEDURE TRAY) ×3 IMPLANT
PENCIL BUTTON HOLSTER BLD 10FT (ELECTRODE) ×3 IMPLANT
SLEEVE SCD COMPRESS KNEE MED (MISCELLANEOUS) ×3 IMPLANT
SPONGE GAUZE 4X4 12PLY STER LF (GAUZE/BANDAGES/DRESSINGS) ×3 IMPLANT
SPONGE LAP 18X18 X RAY DECT (DISPOSABLE) ×3 IMPLANT
STAPLER VISISTAT 35W (STAPLE) IMPLANT
STRIP CLOSURE SKIN 1/2X4 (GAUZE/BANDAGES/DRESSINGS) ×2 IMPLANT
SUT MON AB 4-0 PC3 18 (SUTURE) ×3 IMPLANT
SUT SILK 2 0 SH (SUTURE) IMPLANT
SUT VIC AB 2-0 SH 27 (SUTURE) ×3
SUT VIC AB 2-0 SH 27XBRD (SUTURE) ×1 IMPLANT
SUT VIC AB 3-0 54X BRD REEL (SUTURE) IMPLANT
SUT VIC AB 3-0 BRD 54 (SUTURE)
SUT VIC AB 3-0 SH 27 (SUTURE) ×3
SUT VIC AB 3-0 SH 27X BRD (SUTURE) ×1 IMPLANT
SYR BULB 3OZ (MISCELLANEOUS) ×3 IMPLANT
SYR CONTROL 10ML LL (SYRINGE) ×3 IMPLANT
TOWEL OR 17X24 6PK STRL BLUE (TOWEL DISPOSABLE) ×3 IMPLANT
TOWEL OR NON WOVEN STRL DISP B (DISPOSABLE) ×3 IMPLANT
TUBE CONNECTING 20'X1/4 (TUBING) ×1
TUBE CONNECTING 20X1/4 (TUBING) ×2 IMPLANT
YANKAUER SUCT BULB TIP NO VENT (SUCTIONS) ×3 IMPLANT

## 2014-08-07 NOTE — H&P (Signed)
Patricia [name] is an 74 y.o. female.   Chief Complaint: left breast cancer HPI:  Pt is s/p breast conservation surgery and had a positive superior margin.  She presents for reexcisional lumpectomy.    Past Medical History  Diagnosis Date  . Cough variant asthma   . Osteopenia   . Stricture and stenosis of cervix   . Postmenopausal   . GERD (gastroesophageal reflux disease)   . Disease of pharynx or nasopharynx   . Elevated BP   . Neoplasm   . Personal history of malignant neoplasm of bronchus and lung   . Allergic rhinitis, cause unspecified   . Diverticulosis   . Internal hemorrhoids   . Hiatal hernia   . Hypertension   . Laryngospasm   . 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 Hyperlipidemia   . Glaucoma   . Laryngospasm   . Multinodular goiter   . Chronic cough   . 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 Lung cancer surgery  2003    resection carcinoid lingula-lt upper lobe  . Colonoscopy    . Radioactive seed guided mastectomy with axillary sentinel lymph node biopsy Left 06/26/2014    Procedure: RADIOACTIVE SEED GUIDED PARTIAL MASTECTOMY WITH AXILLARY SENTINEL LYMPH NODE BIOPSY;  Surgeon: Stark Klein, MD;  Location: [facility];  Service: General;  Laterality: Left;  . Breast surgery      Family History  Problem Relation Age of Onset  . Stroke Mother   . Hypertension Mother   . Hyperlipidemia Mother   . Stroke Father   . Transient ischemic attack Father   . Hyperlipidemia Father   . Hypertension Father    Social History:  reports that she quit smoking about 51 years ago. She does not have any smokeless tobacco history on file. She reports that she drinks alcohol. She reports that she does not use illicit drugs.  Allergies:  Allergies  Allergen Reactions  . Combigan [Brimonidine Tartrate-Timolol]     Eyes itch, reddened  . Sulfa Antibiotics Rash    Medications Prior to Admission   Medication Sig Dispense Refill  . ADVAIR HFA 230-21 MCG/ACT inhaler INHALE 2 PUFFS INTO THE LUNGS TWICE A DAY 3 Inhaler 2  . albuterol (VENTOLIN HFA) 108 (90 BASE) MCG/ACT inhaler Inhale 2 puffs into the lungs every 4 (four) hours as needed. (Patient taking differently: Inhale 2 puffs into the lungs as needed for wheezing or shortness of breath. ) 1 Inhaler 3  . aspirin EC 81 MG tablet Take 81 mg by mouth daily.    . bimatoprost (LUMIGAN) 0.03 % ophthalmic drops Place 1 drop into both eyes at bedtime.     . gabapentin (NEURONTIN) 400 MG capsule TAKE 1 CAPSULE TWICE A DAY 180 capsule 1  . loratadine (CLARITIN) 10 MG tablet Take 1 tablet (10 mg total) by mouth daily. 30 tablet 0  . Multiple Vitamins-Iron (MULTIVITAMIN/IRON) TABS Take 1 tablet by mouth daily.     Marland Kitchen omeprazole (PRILOSEC) 20 MG capsule TAKE 1 CAPSULE DAILY 30 MINUTES BEFORE A MEAL 90 capsule 1  . simvastatin (ZOCOR) 40 MG tablet TAKE 1 TABLET AT BEDTIME 90 tablet 1  . tamoxifen (NOLVADEX) 20 MG tablet Take 1 tablet (20 mg total) by mouth daily. 90 tablet 4  . traMADol (ULTRAM) 50 MG tablet TAKE 1 TABLET BY MOUTH EVERY 6 HOURS AS NEEDED FOR PAIN (  Patient taking differently: Take 50 mg by mouth daily. ) 45 tablet 0  . valsartan (DIOVAN) 40 MG tablet TAKE 1 TABLET DAILY 90 tablet 1  . thyroid (ARMOUR THYROID) 15 MG tablet TAKE 1 TABLET (15 MG TOTAL) BY MOUTH DAILY. 90 tablet 1    No results found for this or any previous visit (from the past 48 hour(s)). No results found.  Review of Systems  All other systems reviewed and are negative.   Height '4\' 11"'$  (1.499 m), weight 65.318 kg (144 lb). Physical Exam  Constitutional: She is oriented to person, place, and time. She appears well-developed and well-nourished. No distress.  HENT:  Head: Normocephalic and atraumatic.  Eyes: Conjunctivae are normal. Pupils are equal, round, and reactive to light. No scleral icterus.  Neck: Neck supple.  Cardiovascular: Normal rate.    Respiratory: Effort normal. No respiratory distress.  Left breast incision without evidence of infection.    Musculoskeletal: Normal range of motion.  Neurological: She is alert and oriented to person, place, and time.  Skin: Skin is warm and dry. She is not diaphoretic.  Psychiatric: She has a normal mood and affect. Her behavior is normal. Judgment and thought content normal.     Assessment/Plan Left breast cancer Plan reexcision of superior margin of left breast cancer. Discussed risks and benefits.    Sylar Voong 08/07/2014, 10:14 AM

## 2014-08-07 NOTE — Anesthesia Preprocedure Evaluation (Signed)
Anesthesia Evaluation  Patient identified by MRN, date of birth, ID band Patient awake    Reviewed: Allergy & Precautions, NPO status , Patient's Chart, lab work & pertinent test results  Airway Mallampati: II   Neck ROM: full    Dental   Pulmonary asthma , former smoker,  H/o lung CA breath sounds clear to auscultation        Cardiovascular hypertension, Rhythm:regular Rate:Normal     Neuro/Psych  Neuromuscular disease    GI/Hepatic hiatal hernia, GERD-  ,  Endo/Other  Hypothyroidism Hyperthyroidism   Renal/GU      Musculoskeletal  (+) Arthritis -,   Abdominal   Peds  Hematology   Anesthesia Other Findings   Reproductive/Obstetrics Breast CA                             Anesthesia Physical Anesthesia Plan  ASA: III  Anesthesia Plan: General   Post-op Pain Management:    Induction: Intravenous  Airway Management Planned: LMA  Additional Equipment:   Intra-op Plan:   Post-operative Plan:   Informed Consent: I have reviewed the patients History and Physical, chart, labs and discussed the procedure including the risks, benefits and alternatives for the proposed anesthesia with the patient or authorized representative who has indicated his/her understanding and acceptance.     Plan Discussed with: CRNA, Anesthesiologist and Surgeon  Anesthesia Plan Comments:         Anesthesia Quick Evaluation

## 2014-08-07 NOTE — Anesthesia Postprocedure Evaluation (Signed)
Anesthesia Post Note  Patient: Patricia [name]  Procedure(s) Performed: Procedure(s) (LRB): RE-EXCISION OF LEFT BREAST LUMPECTOMY (Left)  Anesthesia type: General  Patient location: PACU  Post pain: Pain level controlled and Adequate analgesia  Post assessment: Post-op Vital signs reviewed, Patient's Cardiovascular Status Stable, Respiratory Function Stable, Patent Airway and Pain level controlled  Last Vitals:  Filed Vitals:   08/07/14 1330  BP: 118/65  Pulse: 74  Temp:   Resp: 15    Post vital signs: Reviewed and stable  Level of consciousness: awake, alert  and oriented  Complications: No apparent anesthesia complications

## 2014-08-07 NOTE — Transfer of Care (Signed)
Immediate Anesthesia Transfer of Care Note  Patient: Patricia [name]  Procedure(s) Performed: Procedure(s): RE-EXCISION OF LEFT BREAST LUMPECTOMY (Left)  Patient Location: PACU  Anesthesia Type:General  Level of Consciousness: awake and patient cooperative  Airway & Oxygen Therapy: Patient Spontanous Breathing and Patient connected to face mask oxygen  Post-op Assessment: Report given to RN and Post -op Vital signs reviewed and stable  Post vital signs: Reviewed and stable  Last Vitals:  Filed Vitals:   08/07/14 1319  BP:   Pulse: 78  Temp:   Resp: 21    Complications: No apparent anesthesia complications

## 2014-08-07 NOTE — Discharge Instructions (Addendum)
[facility] °Office Phone Number [phone] ° °BREAST BIOPSY/ PARTIAL MASTECTOMY: POST OP INSTRUCTIONS ° °Always review your discharge instruction sheet given to you by the facility where your surgery was performed. ° °IF YOU HAVE DISABILITY OR FAMILY LEAVE FORMS, YOU MUST BRING THEM TO THE OFFICE FOR PROCESSING.  DO NOT GIVE THEM TO YOUR DOCTOR. ° °1. A prescription for pain medication may be given to you upon discharge.  Take your pain medication as prescribed, if needed.  If narcotic pain medicine is not needed, then you may take acetaminophen (Tylenol) or ibuprofen (Advil) as needed. °2. Take your usually prescribed medications unless otherwise directed °3. If you need a refill on your pain medication, please contact your pharmacy.  They will contact our office to request authorization.  Prescriptions will not be filled after 5pm or on week-ends. °4. You should eat very light the first 24 hours after surgery, such as soup, crackers, pudding, etc.  Resume your normal diet the day after surgery. °5. Most patients will experience some swelling and bruising in the breast.  Ice packs and a good support bra will help.  Swelling and bruising can take several days to resolve.  °6. It is common to experience some constipation if taking pain medication after surgery.  Increasing fluid intake and taking a stool softener will usually help or prevent this problem from occurring.  A mild laxative (Milk of Magnesia or Miralax) should be taken according to package directions if there are no bowel movements after 48 hours. °7. Unless discharge instructions indicate otherwise, you may remove your bandages 48 hours after surgery, and you may shower at that time.  You may have steri-strips (small skin tapes) in place directly over the incision.  These strips should be left on the skin for 7-10 days.   Any sutures or staples will be removed at the office during your follow-up visit. °8. ACTIVITIES:  You may resume  regular daily activities (gradually increasing) beginning the next day.  Wearing a good support bra or sports bra (or the breast binder) minimizes pain and swelling.  You may have sexual intercourse when it is comfortable. °a. You may drive when you no longer are taking prescription pain medication, you can comfortably wear a seatbelt, and you can safely maneuver your car and apply brakes. °b. RETURN TO WORK:  __________1 week_______________ °9. You should see your doctor in the office for a follow-up appointment approximately two weeks after your surgery.  Your doctor’s nurse will typically make your follow-up appointment when she calls you with your pathology report.  Expect your pathology report 2-3 business days after your surgery.  You may call to check if you do not hear from us after three days. ° ° °WHEN TO CALL YOUR DOCTOR: °1. Fever over 101.0 °2. Nausea and/or vomiting. °3. Extreme swelling or bruising. °4. Continued bleeding from incision. °5. Increased pain, redness, or drainage from the incision. ° °The clinic staff is available to answer your questions during regular business hours.  Please don’t hesitate to call and ask to speak to one of the nurses for clinical concerns.  If you have a medical emergency, go to the nearest emergency room or call 911.  A surgeon from [facility] is always on call at the hospital. ° °For further questions, please visit [email or web address]  ° ° °Post Anesthesia Home Care Instructions ° °Activity: °Get plenty of rest for the remainder of the day. A responsible adult should stay with you for 24   hours following the procedure.  °For the next 24 hours, DO NOT: °-Drive a car °-Operate machinery °-Drink alcoholic beverages °-Take any medication unless instructed by your physician °-Make any legal decisions or sign important papers. ° °Meals: °Start with liquid foods such as gelatin or soup. Progress to regular foods as tolerated. Avoid greasy, spicy, heavy  foods. If nausea and/or vomiting occur, drink only clear liquids until the nausea and/or vomiting subsides. Call your physician if vomiting continues. ° °Special Instructions/Symptoms: °Your throat may feel dry or sore from the anesthesia or the breathing tube placed in your throat during surgery. If this causes discomfort, gargle with warm salt water. The discomfort should disappear within 24 hours. ° °If you had a scopolamine patch placed behind your ear for the management of post- operative nausea and/or vomiting: ° °1. The medication in the patch is effective for 72 hours, after which it should be removed.  Wrap patch in a tissue and discard in the trash. Wash hands thoroughly with soap and water. °2. You may remove the patch earlier than 72 hours if you experience unpleasant side effects which may include dry mouth, dizziness or visual disturbances. °3. Avoid touching the patch. Wash your hands with soap and water after contact with the patch. °  ° °

## 2014-08-07 NOTE — Anesthesia Procedure Notes (Signed)
Procedure Name: LMA Insertion Date/Time: 08/07/2014 12:33 PM Performed by: Toula Moos L Pre-anesthesia Checklist: Patient identified, Emergency Drugs available, Suction available, Patient being monitored and Timeout performed Patient Re-evaluated:Patient Re-evaluated prior to inductionOxygen Delivery Method: Circle System Utilized Preoxygenation: Pre-oxygenation with 100% oxygen Intubation Type: IV induction Ventilation: Mask ventilation without difficulty LMA: LMA inserted LMA Size: 4.0 Number of attempts: 1 Airway Equipment and Method: Bite block Placement Confirmation: positive ETCO2 Tube secured with: Tape Dental Injury: Teeth and Oropharynx as per pre-operative assessment

## 2014-08-07 NOTE — Op Note (Signed)
Re-excisional left Breast Lumpectomy   Indications: This patient presents with history of positive superior margin after partial mastectomy for left breast cancer   Pre-operative Diagnosis: left breast cancer   Post-operative Diagnosis: left breast cancer   Surgeon: Stark Klein   Assistants: n/a   Anesthesia: General anesthesia and Local anesthesia 0.5% bupivacaine, with epinephrine   ASA Class: 3   Procedure Details  The patient was seen in the Holding Room. The risks, benefits, complications, treatment options, and expected outcomes were discussed with the patient. The possibilities of reaction to medication, pulmonary aspiration, bleeding, infection, the need for additional procedures, failure to diagnose a condition, and creating a complication requiring transfusion or operation were discussed with the patient. The patient concurred with the proposed plan, giving informed consent. The site of surgery properly noted/marked. The patient was taken to Operating Room # 8, identified, and the procedure verified as re-excision of left breast cancer.  After induction of anesthesia, the left breast and chest were prepped and draped in standard fashion.  The lumpectomy was performed by reopening the prior incision. Seroma was aspirated. The mastopexy sutures were removed. Additional margins were taken at superior borders of the partial mastectomy cavity. Dissection was carried down to the pectoral fascia. The specimen was inked. Hemostasis was achieved with cautery. The wound was irrigated and closed with a 3-0 Vicryl deep dermal interrupted and a 4-0 Monocryl subcuticular closure in layers.  Sterile dressings were applied. At the end of the operation, all sponge, instrument, and needle counts were correct.   Findings:  grossly clear surgical margins  Estimated Blood Loss: Minimal   Drains: none   Specimens: additional superior margin  Complications: None; patient tolerated the procedure  well.   Disposition: PACU - hemodynamically stable.   Condition: stable

## 2014-08-08 NOTE — Addendum Note (Signed)
Addendum  created 08/08/14 0754 by Ernesta Amble Corrina Steffensen, CRNA   Modules edited: Charges VN

## 2014-08-09 ENCOUNTER — Ambulatory Visit: Payer: Medicare Other | Admitting: Oncology

## 2014-08-09 ENCOUNTER — Encounter (HOSPITAL_BASED_OUTPATIENT_CLINIC_OR_DEPARTMENT_OTHER): Payer: Self-pay | Admitting: General Surgery

## 2014-08-09 ENCOUNTER — Telehealth: Payer: Self-pay | Admitting: General Practice

## 2014-08-09 MED ORDER — TRAMADOL HCL 50 MG PO TABS: ORAL_TABLET | ORAL | Status: DC

## 2014-08-09 NOTE — Telephone Encounter (Signed)
Ok for #45

## 2014-08-09 NOTE — Telephone Encounter (Signed)
Med filled and faxed.  

## 2014-08-09 NOTE — Telephone Encounter (Signed)
Last OV 02/08/14 Tramadol last filled 06-15-14 #45 with 0

## 2014-08-10 ENCOUNTER — Other Ambulatory Visit: Payer: Self-pay | Admitting: Oncology

## 2014-08-10 ENCOUNTER — Ambulatory Visit (INDEPENDENT_AMBULATORY_CARE_PROVIDER_SITE_OTHER): Payer: Medicare Other | Admitting: Family Medicine

## 2014-08-10 ENCOUNTER — Encounter: Payer: Self-pay | Admitting: Family Medicine

## 2014-08-10 VITALS — BP 140/70 | HR 65 | Temp 98.2°F | Ht 59.0 in | Wt 146.2 lb

## 2014-08-10 DIAGNOSIS — I10 Essential (primary) hypertension: Secondary | ICD-10-CM

## 2014-08-10 DIAGNOSIS — E785 Hyperlipidemia, unspecified: Secondary | ICD-10-CM

## 2014-08-10 LAB — LIPID PANEL
CHOL/HDL RATIO: 3
Cholesterol: 180 mg/dL (ref 0–200)
HDL: 62.5 mg/dL (ref >39.00)
LDL CALC: 98 mg/dL (ref 0–99)
NONHDL: 117.5
TRIGLYCERIDES: 97 mg/dL (ref 0.0–149.0)
VLDL: 19.4 mg/dL (ref 0.0–40.0)

## 2014-08-10 LAB — BASIC METABOLIC PANEL
BUN: 25 mg/dL — AB (ref 6–23)
CO2: 27 meq/L (ref 19–32)
Calcium: 8.9 mg/dL (ref 8.4–10.5)
Chloride: 107 mEq/L (ref 96–112)
Creatinine, Ser: 0.95 mg/dL (ref 0.40–1.20)
GFR: 61.15 mL/min (ref >60.00)
Glucose, Bld: 95 mg/dL (ref 70–99)
Potassium: 4.2 mEq/L (ref 3.5–5.1)
Sodium: 138 mEq/L (ref 135–145)

## 2014-08-10 LAB — HEPATIC FUNCTION PANEL
ALK PHOS: 51 U/L (ref 39–117)
ALT: 13 U/L (ref 0–35)
AST: 17 U/L (ref 0–37)
Albumin: 3.7 g/dL (ref 3.5–5.2)
Bilirubin, Direct: 0.1 mg/dL (ref 0.0–0.3)
TOTAL PROTEIN: 6.2 g/dL (ref 6.0–8.3)
Total Bilirubin: 0.5 mg/dL (ref 0.2–1.2)

## 2014-08-10 MED ORDER — FLUTICASONE-SALMETEROL 230-21 MCG/ACT IN AERO: INHALATION_SPRAY | RESPIRATORY_TRACT | Status: DC

## 2014-08-10 MED ORDER — VALSARTAN 40 MG PO TABS: 40.0000 mg | ORAL_TABLET | Freq: Every day | ORAL | Status: DC

## 2014-08-10 MED ORDER — GABAPENTIN 400 MG PO CAPS: 400.0000 mg | ORAL_CAPSULE | Freq: Two times a day (BID) | ORAL | Status: DC

## 2014-08-10 MED ORDER — SIMVASTATIN 40 MG PO TABS: 40.0000 mg | ORAL_TABLET | Freq: Every day | ORAL | Status: DC

## 2014-08-10 NOTE — Assessment & Plan Note (Signed)
Chronic problem.  Adequate control.  Asymptomatic.  Check labs.  No anticipated med changes 

## 2014-08-10 NOTE — Assessment & Plan Note (Signed)
Chronic problem.  Tolerating statin w/o difficulty.  Check labs.  Adjust meds prn  

## 2014-08-10 NOTE — Progress Notes (Unsigned)
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  Margins were positive. She had additional resection 08/07/2014([id number]) with persistently positive margins.

## 2014-08-10 NOTE — Patient Instructions (Signed)
Schedule your complete physical in 6 months We'll notify you of your lab results and make any changes if needed Keep up the good work!  You look great! Call with any questions or concerns Have an AMAZING adventure and bring pictures next time!!!

## 2014-08-10 NOTE — Progress Notes (Signed)
Pre visit review using our clinic review tool, if applicable. No additional management support is needed unless otherwise documented below in the visit note. 

## 2014-08-10 NOTE — Progress Notes (Signed)
   Subjective:    Patient ID: Tayana Shankle, female    DOB: [birth year], 74 y.o.   MRN: [id number]  HPI HTN- chronic problem, on Diovan.  Denies CP, SOB, HAs, visual changes, edema.  Hyperlipidemia- chronic problem, on Simvastatin.  Denies abd pain, N/V.  Review of Systems For ROS see HPI     Objective:   Physical Exam  Constitutional: She is oriented to person, place, and time. She appears well-developed and well-nourished. No distress.  HENT:  Head: Normocephalic and atraumatic.  Eyes: Conjunctivae and EOM are normal. Pupils are equal, round, and reactive to light.  Neck: Normal range of motion. Neck supple. No thyromegaly present.  Cardiovascular: Normal rate, regular rhythm, normal heart sounds and intact distal pulses.   No murmur heard. Pulmonary/Chest: Effort normal and breath sounds normal. No respiratory distress.  Abdominal: Soft. She exhibits no distension. There is no tenderness.  Musculoskeletal: She exhibits no edema.  Lymphadenopathy:    She has no cervical adenopathy.  Neurological: She is alert and oriented to person, place, and time.  Skin: Skin is warm and dry.  Psychiatric: She has a normal mood and affect. Her behavior is normal.  Vitals reviewed.         Assessment & Plan:

## 2014-08-15 NOTE — Progress Notes (Signed)
Quick Note:  Please let patient know we do not need to do additional surgery. Also make sure we have a follow up appt. ______

## 2014-08-17 NOTE — Progress Notes (Signed)
Location of Breast Cancer:Left breast  Histology per Pathology Report:08/07/14 Diagnosis Breast, excision, Left breast superior margin - INVASIVE LOBULAR CARCINOMA, SEE COMMENT. - TUMOR IS PRESENT AT POSTERIOR MARGIN.  Receptor Status: ER(+, PR (-), Her2-neu (-)  Did patient present with symptoms (if so, please note symptoms) or was this found on screening mammography?: Found on 3-d imaging.Patient states she always palpated area over old lumpectomy scar but nothing showed up on mammogram. Mri on 06/13/13 . Past/Anticipated interventions by surgeon, if any:08/07/14 RE-EXCISION OF BREAST LUMPECTOMY  Past/Anticipated interventions by medical oncology, if any: Chemotherapy not reccommended.Plan for radiation followed by anti-estrogen.  Lymphedema issues, if any: No  Pain issues, if any: No  SAFETY ISSUES:  Prior radiation? No  Pacemaker/ICD?No  Possible current pregnancy?post-menopausal  Is the patient on methotrexate?No  Current Complaints / other details:Married. menses age 74.first live birth age 58.took hormone replacement therapy 1 to 1.5 years. No family h/o cancer. Allergies:combigan and sulfa BP 131/61 mmHg  Pulse 58  Temp(Src) 98.3 F (36.8 C)  Wt 143 lb 6.4 oz (65.046 kg)    Arlyss Repress, RN 08/17/2014,10:36 AM

## 2014-08-21 ENCOUNTER — Ambulatory Visit (HOSPITAL_BASED_OUTPATIENT_CLINIC_OR_DEPARTMENT_OTHER): Payer: Medicare Other | Admitting: Oncology

## 2014-08-21 ENCOUNTER — Telehealth: Payer: Self-pay | Admitting: Oncology

## 2014-08-21 VITALS — BP 119/58 | HR 64 | Temp 98.2°F | Resp 18 | Ht 59.0 in | Wt 142.6 lb

## 2014-08-21 DIAGNOSIS — Z853 Personal history of malignant neoplasm of breast: Secondary | ICD-10-CM

## 2014-08-21 DIAGNOSIS — M8580 Other specified disorders of bone density and structure, unspecified site: Secondary | ICD-10-CM

## 2014-08-21 DIAGNOSIS — C50412 Malignant neoplasm of upper-outer quadrant of left female breast: Secondary | ICD-10-CM

## 2014-08-21 NOTE — Telephone Encounter (Signed)
Gave avs & calendar for September °

## 2014-08-21 NOTE — Progress Notes (Signed)
[facility]  [phone] [phone]     ID: Patricia [name] OB: June 22, 1940  MR#: [id number]  JHE#:[id number]  PCP: Annye Asa, MD GYN:   SU: Stark Klein OTHER MD: Thea Silversmith, Charlena Cross   CHIEF COMPLAINT: left breast cnacer, clinical stage II  TREATMENT: neoadjuvant tamoxifen; awaiting adjuvant radiation  BREAST CANCER HISTORY: From the initial intake note 06/14/2013:  Patricia [name] had screening mammography with tomography at [facility] 05/26/2013 showing some architectural distortion in the left breast superiorly. Ultrasound of the left breast 06/02/2013 showed a 2 cm irregular mass superiorly in the left breast, medial to the scar on the skin from a prior biopsy. This mass was palpable. 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  On 06/13/2013 the patient underwent bilateral breast MRI. This showed a 2.8 cm irregular enhancing mass in the superior left breast but no additional suspicious masses, and no abnormal adenopathy, and no findings in the right breast.  The patient's subsequent history is as detailed below.  INTERVAL HISTORY: Patricia [name] returns today for followup of her estrogen receptor positive breast cancer. Since her last visit here, she underwent left lumpectomy and sentinel lymph node sampling 06/26/2014. The final pathology ([id number]) showed an invasive lobular carcinoma, grade 1, measuring 1.5 cm. Both sentinel lymph nodes were clear. Repeat HER-2 was again negative, with a signals ratio of 1.28 and number per cell 1.60. The superior margin was broadly positive. Accordingly on 08/07/2014 the patient underwent left superior margin reexcision. This showed ([id number]) multiple foci of invasive lobular carcinoma  involving multiple tissue sections. Tumor was broadly adjacent to the posterior margin.  The patient's situation was discussed at the multidisciplinary breast cancer conference 08/15/2014. At that time the issue of margins was discussed. The posterior margin was at the pectoralis fashion and therefore is done. The other margin was focally positive. It was felt the patient could proceed to radiation, with concurrent antiestrogen therapy. All this has been discussed with the patient by Dr. Barry Dienes. Patricia [name] is meeting with radiation oncology 2 days from now.  REVIEW OF SYSTEMS: Patricia [name] did not have significant problems with pain fever or bleeding from the recent surgeries. She is tolerating tamoxifen with no side effects that she is aware of. She is back on the golf course, with good range of motion in the left upper extremity and no lymphedema problems. A detailed review of systems today was noncontributory  PAST MEDICAL HISTORY: Past Medical History  Diagnosis Date  . Cough variant asthma   . Osteopenia   . Stricture and stenosis of cervix   . Postmenopausal   . GERD (gastroesophageal reflux disease)   . Disease of pharynx or nasopharynx   . Elevated BP   . Neoplasm   . Personal history of malignant neoplasm of bronchus and lung   . Allergic rhinitis, cause unspecified   . Diverticulosis   . Internal hemorrhoids   . Hiatal hernia   . Hypertension   . Laryngospasm   . 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 Hyperlipidemia   . Glaucoma   . Laryngospasm   . Multinodular goiter   . Chronic cough   . 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  Lung cancer surgery  2003    resection carcinoid lingula-lt upper lobe  . Colonoscopy    . Radioactive seed guided mastectomy with axillary sentinel lymph node biopsy Left 06/26/2014    Procedure: RADIOACTIVE SEED GUIDED PARTIAL MASTECTOMY WITH AXILLARY SENTINEL LYMPH NODE  BIOPSY;  Surgeon: Stark Klein, MD;  Location: [facility];  Service: General;  Laterality: Left;  . Breast surgery    . Re-excision of breast lumpectomy Left 08/07/2014    Procedure: RE-EXCISION OF LEFT BREAST LUMPECTOMY;  Surgeon: Stark Klein, MD;  Location: [facility];  Service: General;  Laterality: Left;    FAMILY HISTORY Family History  Problem Relation Age of Onset  . Stroke Mother   . Hypertension Mother   . Hyperlipidemia Mother   . Stroke Father   . Transient ischemic attack Father   . Hyperlipidemia Father   . Hypertension Father    the patient's father died from pneumonia the age of 31, in the setting of dementia. The patient's mother died at the age of 6 following a stroke. The patient had one brother, 4 sisters. There is no history of breast or ovarian cancer in the family  GYNECOLOGIC HISTORY:  Menarche age 77, first live birth age 9, she is [place] P2. She did not use hormone replacement at menopause. She never took birth control pills.   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 She does a little bit of swelling on the side. Her husband [name] is retired from the [employer] (Designer, fashion/clothing) and then worked for [employer]. He is an avid Animator. Son [name] lives in [place] and is an Art gallery manager. Daughter [name] lives in [place] and is a Astronomer. The patient has 5 grandchildren. She attends the [employer]    ADVANCED DIRECTIVES: In place   HEALTH MAINTENANCE: History  Substance Use Topics  . Smoking status: Former Smoker    Quit date: 06/15/1963  . Smokeless tobacco: Not on file  . Alcohol Use: Yes     Comment: occasional wine     Colonoscopy:  PAP:  Bone density: 07/12/2013 at [facility], T score of -1.8 at the femoral neck  Lipid panel:  Allergies  Allergen Reactions  . Combigan [Brimonidine Tartrate-Timolol]     Eyes itch, reddened  . Sulfa Antibiotics Rash    Current Outpatient  Prescriptions  Medication Sig Dispense Refill  . albuterol (VENTOLIN HFA) 108 (90 BASE) MCG/ACT inhaler Inhale 2 puffs into the lungs every 4 (four) hours as needed. (Patient taking differently: Inhale 2 puffs into the lungs as needed for wheezing or shortness of breath. ) 1 Inhaler 3  . aspirin EC 81 MG tablet Take 81 mg by mouth daily.    . bimatoprost (LUMIGAN) 0.03 % ophthalmic drops Place 1 drop into both eyes at bedtime.     . fluticasone-salmeterol (ADVAIR HFA) 230-21 MCG/ACT inhaler INHALE 2 PUFFS INTO THE LUNGS TWICE A DAY 3 Inhaler 2  . gabapentin (NEURONTIN) 400 MG capsule Take 1 capsule (400 mg total) by mouth 2 (two) times daily. 180 capsule 1  . loratadine (CLARITIN) 10 MG tablet Take 1 tablet (10 mg total) by mouth daily. 30 tablet 0  . Multiple Vitamins-Iron (MULTIVITAMIN/IRON) TABS Take 1 tablet by mouth daily.     Marland Kitchen omeprazole (PRILOSEC) 20 MG capsule TAKE 1 CAPSULE DAILY 30 MINUTES BEFORE A MEAL 90 capsule 1  . simvastatin (ZOCOR) 40 MG tablet Take 1 tablet (40 mg total) by mouth at  bedtime. 90 tablet 1  . tamoxifen (NOLVADEX) 20 MG tablet Take 1 tablet (20 mg total) by mouth daily. 90 tablet 4  . thyroid (ARMOUR THYROID) 15 MG tablet TAKE 1 TABLET (15 MG TOTAL) BY MOUTH DAILY. 90 tablet 1  . traMADol (ULTRAM) 50 MG tablet TAKE 1 TABLET BY MOUTH EVERY 6 HOURS AS NEEDED FOR PAIN 45 tablet 0  . valsartan (DIOVAN) 40 MG tablet Take 1 tablet (40 mg total) by mouth daily. 90 tablet 1   No current facility-administered medications for this visit.    OBJECTIVE: Middle-aged white woman  Filed Vitals:   08/21/14 0846  BP: 119/58  Pulse: 64  Temp: 98.2 F (36.8 C)  Resp: 18     Body mass index is 28.79 kg/(m^2).    ECOG FS:0 - Asymptomatic  Sclerae unicteric, pupils round and equal Oropharynx clear and moist-- no thrush or other lesions No cervical or supraclavicular adenopathy Lungs no rales or rhonchi Heart regular rate and rhythm Abd soft, nontender, positive bowel  sounds MSK no focal spinal tenderness, no upper extremity lymphedema Neuro: nonfocal, well oriented, appropriate affect Breasts: The right breast is unremarkable. The left breast is status post recent lumpectomy. The cosmetic result is excellent. The incisions are healing nicely, with no swelling, erythema, or dehiscence. There is no evidence of residual or recurrent disease. The left axilla is benign.  Marland Kitchen  LAB RESULTS:  CMP     Component Value Date/Time   NA 138 08/10/2014 1012   NA 142 06/13/2014 1036   K 4.2 08/10/2014 1012   K 4.5 06/13/2014 1036   CL 107 08/10/2014 1012   CO2 27 08/10/2014 1012   CO2 27 06/13/2014 1036   GLUCOSE 95 08/10/2014 1012   GLUCOSE 100 06/13/2014 1036   GLUCOSE 88 01/13/2006 1012   BUN 25* 08/10/2014 1012   BUN 22.3 06/13/2014 1036   CREATININE 0.95 08/10/2014 1012   CREATININE 0.9 06/13/2014 1036   CALCIUM 8.9 08/10/2014 1012   CALCIUM 8.7 06/13/2014 1036   PROT 6.2 08/10/2014 1012   PROT 6.5 06/13/2014 1036   ALBUMIN 3.7 08/10/2014 1012   ALBUMIN 3.7 06/13/2014 1036   AST 17 08/10/2014 1012   AST 21 06/13/2014 1036   ALT 13 08/10/2014 1012   ALT 18 06/13/2014 1036   ALKPHOS 51 08/10/2014 1012   ALKPHOS 61 06/13/2014 1036   BILITOT 0.5 08/10/2014 1012   BILITOT 0.54 06/13/2014 1036   GFRNONAA 57* 08/01/2014 1417   GFRAA >60 08/01/2014 1417    I No results found for: SPEP  Lab Results  Component Value Date   WBC 6.3 08/01/2014   NEUTROABS 4.0 06/13/2014   HGB 14.0 08/01/2014   HCT 41.4 08/01/2014   MCV 93.5 08/01/2014   PLT 219 08/01/2014      Chemistry      Component Value Date/Time   NA 138 08/10/2014 1012   NA 142 06/13/2014 1036   K 4.2 08/10/2014 1012   K 4.5 06/13/2014 1036   CL 107 08/10/2014 1012   CO2 27 08/10/2014 1012   CO2 27 06/13/2014 1036   BUN 25* 08/10/2014 1012   BUN 22.3 06/13/2014 1036   CREATININE 0.95 08/10/2014 1012   CREATININE 0.9 06/13/2014 1036      Component Value Date/Time   CALCIUM 8.9  08/10/2014 1012   CALCIUM 8.7 06/13/2014 1036   ALKPHOS 51 08/10/2014 1012   ALKPHOS 61 06/13/2014 1036   AST 17 08/10/2014 1012   AST 21 06/13/2014  1036   ALT 13 08/10/2014 1012   ALT 18 06/13/2014 1036   BILITOT 0.5 08/10/2014 1012   BILITOT 0.54 06/13/2014 1036       No results found for: LABCA2  No components found for: LABCA125  No results for input(s): INR in the last 168 hours.  Urinalysis    Component Value Date/Time   COLORURINE yellow 05/20/2007 0942   APPEARANCEUR Clear 05/20/2007 0942   LABSPEC <1.005 05/20/2007 0942   PHURINE 7.5 05/20/2007 0942   HGBUR negative 05/20/2007 0942   BILIRUBINUR negative 05/20/2007 0942   UROBILINOGEN 0.2 05/20/2007 0942   NITRITE negative 05/20/2007 0942    STUDIES: No results found.  ASSESSMENT: 74 y.o.  woman status post left breast biopsy for 08/19/2013 for a clinical T2 N0, stage IIA invasive lobular breast cancer, estrogen receptor positive, progesterone receptor negative, with an MIB-1 of 20%. There was not sufficient tissue for HER-2 testing.  (1) neoadjuvant anastrozole started mid April 2015, discontinued early June 2015 with poor tolerance  (2) tamoxifen started first week in July 2015  (3) osteopenia with a T score of -1.8 at the femoral necks 07/12/2013  (4) left lumpectomy 06/26/2014 showed a pT1c pN0, stage IA invasive lobular carcinoma, grade 1, again HER-2 negative, with positive margins   (a) additional surgery 08/07/2014 did not completely clear margins, but no further surgery was advised  (5) radiation to follow surgery  (6) continue tamoxifen to total 10 years   PLAN: Patricia [name] did well with her surgery. She understands that while the margins are not entirely clear, further margin excision likely wouldn't lead only to mastectomy. We feel comfortable with her keeping her breast and proceeding to radiation, which is the next.  We sometimes stop anti-estrogens during radiation, but in her case  I would feel comfortable continuing so long as her radiation oncologist also is comfortable. The overall plan is for [facility] to be an anti-estrogens for 10 years total.  She will see me again in about 3 months. She will be done with radiation at that time. We will start her routine follow-up then. She will call with any problems that may develop before that visit.     Chauncey Cruel, MD   08/21/2014 9:26 AM

## 2014-08-22 ENCOUNTER — Ambulatory Visit
Admission: RE | Admit: 2014-08-22 | Discharge: 2014-08-22 | Disposition: A | Discharge status: Home / Self Care | Payer: Medicare Other | Source: Ambulatory Visit | Attending: Radiation Oncology | Admitting: Radiation Oncology

## 2014-08-22 VITALS — BP 131/61 | HR 58 | Temp 98.3°F | Wt 143.4 lb

## 2014-08-22 DIAGNOSIS — C50412 Malignant neoplasm of upper-outer quadrant of left female breast: Secondary | ICD-10-CM

## 2014-08-22 NOTE — Progress Notes (Signed)
   Department of Radiation Oncology  Phone:  [phone] Fax:        [phone]   Name: Patricia [name] MRN: [id number]  DOB: [birth year]  Date: 08/22/2014  Follow Up Visit Note  Diagnosis: Breast cancer of upper-outer quadrant of left female breast   Staging form: Breast, AJCC 7th Edition     Clinical: Stage IIA (T2, N0, cM0) - Unsigned       Staging comments: Staged at breast conference 06/14/13      Pathologic stage from 06/28/2014: Stage IA (yT1c, N0, cM0) - Signed by Enid Cutter, MD on 07/12/2014       Staging comments: Staged on final lumpectomy specimen by Dr. Donato Heinz  Interval History: Patricia [name] presents today for routine followup.  She had her lumpectomy on 4/26 and then had a re-excision for margins on 6/7. The lumpectomy showed a 1.5 cm invasive lobular carcinoma. Her re-excision showed more invasive lobular carcinoma which was focally present at the posterior margin which was the pec muscle. After discussion at tumor board, we elected to proceed with radiation. She has healed up well. She and her husband are embarking on a cruise in July for 15 days and she would like to wait until after that to begin. She was treated with neoadjuvant antiestrogen therapy and will not be receiving chemotherapy.   Physical Exam:  Filed Vitals:   08/22/14 1405  BP: 131/61  Pulse: 58  Temp: 98.3 F (36.8 C)  Weight: 143 lb 6.4 oz (65.046 kg)   Pleasant. No distress.   IMPRESSION: Patricia [name] is a 74 y.o. female s/p neoadjuvant antiestrogen therapy followed by lumpectomy with positive posterior margin  PLAN:  I spoke to the patient today regarding her diagnosis and options for treatment. We discussed the equivalence in terms of survival and local failure between mastectomy and breast conservation. We discussed the role of radiation in decreasing local failures in patients who undergo lumpectomy. We discussed the process of simulation and the placement tattoos. We discussed 4 weeks of treatment as an outpatient  including a boost to this area of concern along the posterior margin/chest wall.. We discussed the possibility of asymptomatic lung damage. We discussed the low likelihood of secondary malignancies. We discussed the possible side effects including but not limited to skin redness, fatigue, permanent skin darkening, and breast swelling. She signed informed consent. She will call to schedule simulation when she returns from her trip.     Thea Silversmith, MD

## 2014-08-30 DIAGNOSIS — H4011X3 Primary open-angle glaucoma, severe stage: Secondary | ICD-10-CM | POA: Diagnosis not present

## 2014-08-30 NOTE — Addendum Note (Signed)
Encounter addended by: Norm Salt, RN on: 08/30/2014  9:29 AM<BR>     Documentation filed: Charges VN

## 2014-09-21 ENCOUNTER — Telehealth: Payer: Self-pay | Admitting: Family Medicine

## 2014-09-21 MED ORDER — TRAMADOL HCL 50 MG PO TABS: ORAL_TABLET | ORAL | Status: DC

## 2014-09-21 NOTE — Telephone Encounter (Signed)
Caller name: Remingtyn Relation to pt: Call back number: [phone] Pharmacy: [facility] [address]   Reason for call:   Patient is requesting tramadol refill. She states that she had called the pharmacy earlier in the week and is needing this by this afternoon because she will be leaving the country for a couple of weeks.

## 2014-09-21 NOTE — Telephone Encounter (Signed)
Ok for #45

## 2014-09-21 NOTE — Telephone Encounter (Signed)
Rx faxed to [id number] [place], [place] And patient notified.

## 2014-09-21 NOTE — Telephone Encounter (Signed)
Patient requesting Tramadol: Last refill 08/09/14 for 45 and 0 LOV 08/10/14  Last UDS 12/29/12 low risk  Please advise.

## 2014-09-24 ENCOUNTER — Other Ambulatory Visit: Payer: Self-pay | Admitting: Family Medicine

## 2014-10-11 ENCOUNTER — Ambulatory Visit
Admission: RE | Admit: 2014-10-11 | Discharge: 2014-10-11 | Disposition: A | Discharge status: Home / Self Care | Payer: Medicare Other | Source: Ambulatory Visit | Attending: Radiation Oncology | Admitting: Radiation Oncology

## 2014-10-11 DIAGNOSIS — C50412 Malignant neoplasm of upper-outer quadrant of left female breast: Secondary | ICD-10-CM | POA: Diagnosis not present

## 2014-10-11 DIAGNOSIS — Z510 Encounter for antineoplastic radiation therapy: Secondary | ICD-10-CM | POA: Insufficient documentation

## 2014-10-11 DIAGNOSIS — C50912 Malignant neoplasm of unspecified site of left female breast: Secondary | ICD-10-CM | POA: Diagnosis not present

## 2014-10-11 NOTE — Progress Notes (Signed)
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  IMMOBILIZATION: The following immobilization was used:Custom Moldable Pillow, breast board.  NARRATIVE: Patricia [name] was brought to the [facility].  Identity was confirmed.  All relevant records and images related to the planned course of therapy were reviewed.  Then, the patient was positioned in a stable reproducible clinical set-up for radiation therapy.  Wires were placed to delineate the clinical extent of breast tissue. A wire was placed on the scar as well.  CT images were obtained.  An isocenter was placed. Skin markings were placed.  The position of the heart was then analyzed.  Due to the proximity of the heart to the chest wall, I felt she would benefit from deep inspiration breath hold for cardiac sparing.  She was then coached and rescanned in the breath hold position.  Acceptable cardiac sparing was achieved. The CT images were loaded into the planning software where the target and avoidance structures were contoured.  The radiation prescription was entered and confirmed. The patient was discharged in stable condition and tolerated simulation well.    TREATMENT PLANNING NOTE/3D Simulation Note Treatment planning then occurred. I have requested : MLC's, isodose plan, basic dose calculation  3D simulation was performed.  I personally supervised and oversaw the construction of 5 medically necessary complex treatment devices in the form of MLCs which will be used for beam modification purposes and to protect critical structures including the heart and lung as well as the immobilization devices which will be used to ensure reproducible set up.  I have requested a dose volume histogram of the heart lung and tumor cavity.    Special treatment procedure was  performed today due to the extra time and effort required by myself to plan and prepare this patient for deep inspiration breath hold technique.  I have determined cardiac sparing to be of benefit to this patient to prevent long term cardiac damage due to radiation of the heart.  Bellows were placed on the patient's abdomen. To facilitate cardiac sparing, the patient was coached by the radiation therapists on breath hold techniques and breathing practice was performed. Practice waveforms were obtained. The patient was then scanned while maintaining breath hold in the treatment position.  This image was then transferred over to the imaging specialist. The imaging specialist then created a fusion of the free breathing and breath hold scans using the chest wall as the stable structure. I personally reviewed the fusion in axial, coronal and sagittal image planes.  Excellent cardiac sparing was obtained.  I felt the patient is an appropriate candidate for breath hold and the patient will be treated as such.  The image fusion was then reviewed with the patient to reinforce the necessity of reproducible breath hold.  If she develops any further questions or concerns in regards to her treatment, she has been encouraged to contact Dr. Pablo Ledger.  This document serves as a record of services personally performed by Thea Silversmith , MD. It was created on her behalf by Lenn Cal, a trained medical scribe. The creation of this record is based on the scribe's personal observations and the provider's statements to them. This document has been checked and approved by the attending provider.   ------------------------------------------------  Thea Silversmith, MD

## 2014-10-16 DIAGNOSIS — C50412 Malignant neoplasm of upper-outer quadrant of left female breast: Secondary | ICD-10-CM | POA: Diagnosis not present

## 2014-10-16 DIAGNOSIS — C50912 Malignant neoplasm of unspecified site of left female breast: Secondary | ICD-10-CM | POA: Diagnosis not present

## 2014-10-16 DIAGNOSIS — Z510 Encounter for antineoplastic radiation therapy: Secondary | ICD-10-CM | POA: Diagnosis not present

## 2014-10-17 DIAGNOSIS — C50912 Malignant neoplasm of unspecified site of left female breast: Secondary | ICD-10-CM | POA: Diagnosis not present

## 2014-10-17 DIAGNOSIS — Z510 Encounter for antineoplastic radiation therapy: Secondary | ICD-10-CM | POA: Diagnosis not present

## 2014-10-18 ENCOUNTER — Ambulatory Visit
Admission: RE | Admit: 2014-10-18 | Discharge: 2014-10-18 | Disposition: A | Discharge status: Home / Self Care | Payer: Medicare Other | Source: Ambulatory Visit | Attending: Radiation Oncology | Admitting: Radiation Oncology

## 2014-10-18 DIAGNOSIS — C50912 Malignant neoplasm of unspecified site of left female breast: Secondary | ICD-10-CM | POA: Diagnosis not present

## 2014-10-18 DIAGNOSIS — Z510 Encounter for antineoplastic radiation therapy: Secondary | ICD-10-CM | POA: Diagnosis not present

## 2014-10-18 DIAGNOSIS — C50412 Malignant neoplasm of upper-outer quadrant of left female breast: Secondary | ICD-10-CM | POA: Diagnosis not present

## 2014-10-22 ENCOUNTER — Ambulatory Visit
Admission: RE | Admit: 2014-10-22 | Discharge: 2014-10-22 | Disposition: A | Discharge status: Home / Self Care | Payer: Medicare Other | Source: Ambulatory Visit | Attending: Radiation Oncology | Admitting: Radiation Oncology

## 2014-10-22 DIAGNOSIS — C50412 Malignant neoplasm of upper-outer quadrant of left female breast: Secondary | ICD-10-CM | POA: Diagnosis not present

## 2014-10-22 DIAGNOSIS — Z510 Encounter for antineoplastic radiation therapy: Secondary | ICD-10-CM | POA: Diagnosis not present

## 2014-10-22 DIAGNOSIS — C50912 Malignant neoplasm of unspecified site of left female breast: Secondary | ICD-10-CM | POA: Diagnosis not present

## 2014-10-23 ENCOUNTER — Encounter: Payer: Self-pay | Admitting: Radiation Oncology

## 2014-10-23 ENCOUNTER — Ambulatory Visit
Admission: RE | Admit: 2014-10-23 | Discharge: 2014-10-23 | Disposition: A | Discharge status: Home / Self Care | Payer: Medicare Other | Source: Ambulatory Visit | Attending: Radiation Oncology | Admitting: Radiation Oncology

## 2014-10-23 VITALS — BP 114/62 | HR 63 | Temp 98.2°F | Ht 71.0 in | Wt 146.7 lb

## 2014-10-23 DIAGNOSIS — C50912 Malignant neoplasm of unspecified site of left female breast: Secondary | ICD-10-CM | POA: Diagnosis not present

## 2014-10-23 DIAGNOSIS — Z510 Encounter for antineoplastic radiation therapy: Secondary | ICD-10-CM | POA: Diagnosis not present

## 2014-10-23 DIAGNOSIS — C50412 Malignant neoplasm of upper-outer quadrant of left female breast: Secondary | ICD-10-CM | POA: Insufficient documentation

## 2014-10-23 MED ORDER — RADIAPLEXRX EX GEL
Freq: Once | CUTANEOUS | Status: AC
  Administered 2014-10-23: 18:00:00 via TOPICAL

## 2014-10-23 MED ORDER — ALRA NON-METALLIC DEODORANT (RAD-ONC)
1.0000 "application " | Freq: Once | TOPICAL | Status: AC
  Administered 2014-10-23: 1 via TOPICAL

## 2014-10-23 NOTE — Addendum Note (Signed)
Encounter addended by: Norm Salt, RN on: 10/23/2014  5:54 PM<BR>     Documentation filed: [employer], Orders

## 2014-10-23 NOTE — Progress Notes (Signed)
Patricia [name] has received 2 fractions to his left breast.  No voiced concerns at this time. Pt here for patient teaching.  Pt given Radiation and You booklet, skin care instructions, Alra deodorant and Radiaplex gel. Pt reports they have Given link ot watch, watched, not watched and refused to watch the Radiation Therapy Education video on Reviewed areas of pertinence such as fatigue, skin changes, breast tenderness, breast swelling, earaches and taste changes . Pt able to give teach back of to pat skin, use unscented/gentle soap and drink plenty of water,apply Radiaplex bid, avoid applying anything to skin within 4 hours of treatment, avoid wearing an under wire bra and to use an electric razor if they must shave. Pt demonstrated understanding and verbalizes understanding of information given and will contact nursing with any questions or concerns.    Will watch video at home.

## 2014-10-23 NOTE — Addendum Note (Signed)
Encounter addended by: Norm Salt, RN on: 10/23/2014  6:00 PM<BR>     Documentation filed: Inpatient MAR

## 2014-10-23 NOTE — Progress Notes (Signed)
Weekly Management Note Current Dose: 5.34  Gy  Projected Dose: 42.72 Gy   Narrative:  The patient presents for routine under treatment assessment.  CBCT/MVCT images/Port film x-rays were reviewed.  The chart was checked. Doing well. RN education performed.  Physical Findings: Weight: 146 lb 11.2 oz (66.543 kg). Unchanged  Impression:  The patient is tolerating radiation.  Plan:  Continue treatment as planned. Start radiaplex.

## 2014-10-24 ENCOUNTER — Ambulatory Visit
Admission: RE | Admit: 2014-10-24 | Discharge: 2014-10-24 | Disposition: A | Discharge status: Home / Self Care | Payer: Medicare Other | Source: Ambulatory Visit | Attending: Radiation Oncology | Admitting: Radiation Oncology

## 2014-10-24 DIAGNOSIS — Z510 Encounter for antineoplastic radiation therapy: Secondary | ICD-10-CM | POA: Diagnosis not present

## 2014-10-24 DIAGNOSIS — C50412 Malignant neoplasm of upper-outer quadrant of left female breast: Secondary | ICD-10-CM | POA: Diagnosis not present

## 2014-10-24 DIAGNOSIS — C50912 Malignant neoplasm of unspecified site of left female breast: Secondary | ICD-10-CM | POA: Diagnosis not present

## 2014-10-25 ENCOUNTER — Ambulatory Visit
Admission: RE | Admit: 2014-10-25 | Discharge: 2014-10-25 | Disposition: A | Discharge status: Home / Self Care | Payer: Medicare Other | Source: Ambulatory Visit | Attending: Radiation Oncology | Admitting: Radiation Oncology

## 2014-10-25 DIAGNOSIS — C50412 Malignant neoplasm of upper-outer quadrant of left female breast: Secondary | ICD-10-CM | POA: Diagnosis not present

## 2014-10-25 DIAGNOSIS — Z510 Encounter for antineoplastic radiation therapy: Secondary | ICD-10-CM | POA: Diagnosis not present

## 2014-10-25 DIAGNOSIS — C50912 Malignant neoplasm of unspecified site of left female breast: Secondary | ICD-10-CM | POA: Diagnosis not present

## 2014-10-26 ENCOUNTER — Ambulatory Visit
Admission: RE | Admit: 2014-10-26 | Discharge: 2014-10-26 | Disposition: A | Discharge status: Home / Self Care | Payer: Medicare Other | Source: Ambulatory Visit | Attending: Radiation Oncology | Admitting: Radiation Oncology

## 2014-10-26 DIAGNOSIS — Z510 Encounter for antineoplastic radiation therapy: Secondary | ICD-10-CM | POA: Diagnosis not present

## 2014-10-26 DIAGNOSIS — C50412 Malignant neoplasm of upper-outer quadrant of left female breast: Secondary | ICD-10-CM | POA: Diagnosis not present

## 2014-10-26 DIAGNOSIS — C50912 Malignant neoplasm of unspecified site of left female breast: Secondary | ICD-10-CM | POA: Diagnosis not present

## 2014-10-29 ENCOUNTER — Ambulatory Visit
Admission: RE | Admit: 2014-10-29 | Discharge: 2014-10-29 | Disposition: A | Discharge status: Home / Self Care | Payer: Medicare Other | Source: Ambulatory Visit | Attending: Radiation Oncology | Admitting: Radiation Oncology

## 2014-10-29 DIAGNOSIS — Z510 Encounter for antineoplastic radiation therapy: Secondary | ICD-10-CM | POA: Diagnosis not present

## 2014-10-29 DIAGNOSIS — C50912 Malignant neoplasm of unspecified site of left female breast: Secondary | ICD-10-CM | POA: Diagnosis not present

## 2014-10-29 DIAGNOSIS — C50412 Malignant neoplasm of upper-outer quadrant of left female breast: Secondary | ICD-10-CM | POA: Diagnosis not present

## 2014-10-30 ENCOUNTER — Ambulatory Visit
Admission: RE | Admit: 2014-10-30 | Discharge: 2014-10-30 | Disposition: A | Discharge status: Home / Self Care | Payer: Medicare Other | Source: Ambulatory Visit | Attending: Radiation Oncology | Admitting: Radiation Oncology

## 2014-10-30 DIAGNOSIS — C50412 Malignant neoplasm of upper-outer quadrant of left female breast: Secondary | ICD-10-CM | POA: Diagnosis not present

## 2014-10-30 DIAGNOSIS — C50912 Malignant neoplasm of unspecified site of left female breast: Secondary | ICD-10-CM | POA: Diagnosis not present

## 2014-10-30 DIAGNOSIS — Z510 Encounter for antineoplastic radiation therapy: Secondary | ICD-10-CM | POA: Diagnosis not present

## 2014-10-30 NOTE — Progress Notes (Signed)
Weekly Management Note Current Dose: 10.68  Gy  Projected Dose: 42.72 Gy   Narrative:  The patient presents for routine under treatment assessment.  CBCT/MVCT images/Port film x-rays were reviewed.  The chart was checked. Doing well.   Physical Findings: Weight:  . Unchanged skin.   Impression:  The patient is tolerating radiation.  Plan:  Continue treatment as planned. Continueradiaplex.

## 2014-10-31 ENCOUNTER — Ambulatory Visit
Admission: RE | Admit: 2014-10-31 | Discharge: 2014-10-31 | Disposition: A | Discharge status: Home / Self Care | Payer: Medicare Other | Source: Ambulatory Visit | Attending: Radiation Oncology | Admitting: Radiation Oncology

## 2014-10-31 DIAGNOSIS — C50412 Malignant neoplasm of upper-outer quadrant of left female breast: Secondary | ICD-10-CM | POA: Diagnosis not present

## 2014-10-31 DIAGNOSIS — C50912 Malignant neoplasm of unspecified site of left female breast: Secondary | ICD-10-CM | POA: Diagnosis not present

## 2014-10-31 DIAGNOSIS — Z510 Encounter for antineoplastic radiation therapy: Secondary | ICD-10-CM | POA: Diagnosis not present

## 2014-11-01 ENCOUNTER — Ambulatory Visit
Admission: RE | Admit: 2014-11-01 | Discharge: 2014-11-01 | Disposition: A | Discharge status: Home / Self Care | Payer: Medicare Other | Source: Ambulatory Visit | Attending: Radiation Oncology | Admitting: Radiation Oncology

## 2014-11-01 DIAGNOSIS — C50412 Malignant neoplasm of upper-outer quadrant of left female breast: Secondary | ICD-10-CM | POA: Diagnosis not present

## 2014-11-01 DIAGNOSIS — C50912 Malignant neoplasm of unspecified site of left female breast: Secondary | ICD-10-CM | POA: Diagnosis not present

## 2014-11-01 DIAGNOSIS — Z510 Encounter for antineoplastic radiation therapy: Secondary | ICD-10-CM | POA: Diagnosis not present

## 2014-11-02 ENCOUNTER — Ambulatory Visit
Admission: RE | Admit: 2014-11-02 | Discharge: 2014-11-02 | Disposition: A | Discharge status: Home / Self Care | Payer: Medicare Other | Source: Ambulatory Visit | Attending: Radiation Oncology | Admitting: Radiation Oncology

## 2014-11-02 DIAGNOSIS — Z510 Encounter for antineoplastic radiation therapy: Secondary | ICD-10-CM | POA: Diagnosis not present

## 2014-11-02 DIAGNOSIS — C50412 Malignant neoplasm of upper-outer quadrant of left female breast: Secondary | ICD-10-CM | POA: Diagnosis not present

## 2014-11-02 DIAGNOSIS — C50912 Malignant neoplasm of unspecified site of left female breast: Secondary | ICD-10-CM | POA: Diagnosis not present

## 2014-11-06 ENCOUNTER — Ambulatory Visit
Admission: RE | Admit: 2014-11-06 | Discharge: 2014-11-06 | Disposition: A | Discharge status: Home / Self Care | Payer: Medicare Other | Source: Ambulatory Visit | Attending: Radiation Oncology | Admitting: Radiation Oncology

## 2014-11-06 VITALS — BP 144/82 | HR 63 | Temp 97.5°F | Wt 146.3 lb

## 2014-11-06 DIAGNOSIS — Z510 Encounter for antineoplastic radiation therapy: Secondary | ICD-10-CM | POA: Diagnosis not present

## 2014-11-06 DIAGNOSIS — C50412 Malignant neoplasm of upper-outer quadrant of left female breast: Secondary | ICD-10-CM | POA: Diagnosis not present

## 2014-11-06 DIAGNOSIS — C50912 Malignant neoplasm of unspecified site of left female breast: Secondary | ICD-10-CM | POA: Diagnosis not present

## 2014-11-06 NOTE — Progress Notes (Signed)
Weekly assessment of radiation to left breast.Completed 11 of 16 fractions.Denies pain.No skin changes.Has some fatigue.

## 2014-11-06 NOTE — Progress Notes (Signed)
Weekly Management Note Current Dose:  29.37 Gy  Projected Dose: 42.72 Gy   Narrative:  The patient presents for routine under treatment assessment.  CBCT/MVCT images/Port film x-rays were reviewed.  The chart was checked. Some fatigue. Doing well.   Physical Findings: Weight: 146 lb 4.8 oz (66.361 kg). Unchanged  Impression:  The patient is tolerating radiation.  Plan:  Continue treatment as planned. Continue radiaplex.

## 2014-11-07 ENCOUNTER — Ambulatory Visit
Admission: RE | Admit: 2014-11-07 | Discharge: 2014-11-07 | Disposition: A | Discharge status: Home / Self Care | Payer: Medicare Other | Source: Ambulatory Visit | Attending: Radiation Oncology | Admitting: Radiation Oncology

## 2014-11-07 DIAGNOSIS — C50912 Malignant neoplasm of unspecified site of left female breast: Secondary | ICD-10-CM | POA: Diagnosis not present

## 2014-11-07 DIAGNOSIS — Z510 Encounter for antineoplastic radiation therapy: Secondary | ICD-10-CM | POA: Diagnosis not present

## 2014-11-07 DIAGNOSIS — C50412 Malignant neoplasm of upper-outer quadrant of left female breast: Secondary | ICD-10-CM | POA: Diagnosis not present

## 2014-11-08 ENCOUNTER — Ambulatory Visit
Admission: RE | Admit: 2014-11-08 | Discharge: 2014-11-08 | Disposition: A | Discharge status: Home / Self Care | Payer: Medicare Other | Source: Ambulatory Visit | Attending: Radiation Oncology | Admitting: Radiation Oncology

## 2014-11-08 DIAGNOSIS — C50412 Malignant neoplasm of upper-outer quadrant of left female breast: Secondary | ICD-10-CM | POA: Diagnosis not present

## 2014-11-08 DIAGNOSIS — Z510 Encounter for antineoplastic radiation therapy: Secondary | ICD-10-CM | POA: Diagnosis not present

## 2014-11-08 DIAGNOSIS — C50912 Malignant neoplasm of unspecified site of left female breast: Secondary | ICD-10-CM | POA: Diagnosis not present

## 2014-11-09 ENCOUNTER — Ambulatory Visit
Admission: RE | Admit: 2014-11-09 | Discharge: 2014-11-09 | Disposition: A | Discharge status: Home / Self Care | Payer: Medicare Other | Source: Ambulatory Visit | Attending: Radiation Oncology | Admitting: Radiation Oncology

## 2014-11-09 ENCOUNTER — Other Ambulatory Visit: Payer: Self-pay | Admitting: Family Medicine

## 2014-11-09 DIAGNOSIS — Z510 Encounter for antineoplastic radiation therapy: Secondary | ICD-10-CM | POA: Diagnosis not present

## 2014-11-09 DIAGNOSIS — C50412 Malignant neoplasm of upper-outer quadrant of left female breast: Secondary | ICD-10-CM | POA: Diagnosis not present

## 2014-11-09 DIAGNOSIS — C50912 Malignant neoplasm of unspecified site of left female breast: Secondary | ICD-10-CM | POA: Diagnosis not present

## 2014-11-09 NOTE — Telephone Encounter (Signed)
Medication filled to pharmacy as requested.   

## 2014-11-09 NOTE — Telephone Encounter (Signed)
Last OV 08/10/14 Tramadol last filled 09/21/14 #45 with 0

## 2014-11-12 ENCOUNTER — Ambulatory Visit
Admission: RE | Admit: 2014-11-12 | Discharge: 2014-11-12 | Disposition: A | Discharge status: Home / Self Care | Payer: Medicare Other | Source: Ambulatory Visit | Attending: Radiation Oncology | Admitting: Radiation Oncology

## 2014-11-12 DIAGNOSIS — C50912 Malignant neoplasm of unspecified site of left female breast: Secondary | ICD-10-CM | POA: Diagnosis not present

## 2014-11-12 DIAGNOSIS — Z510 Encounter for antineoplastic radiation therapy: Secondary | ICD-10-CM | POA: Diagnosis not present

## 2014-11-12 DIAGNOSIS — C50412 Malignant neoplasm of upper-outer quadrant of left female breast: Secondary | ICD-10-CM | POA: Diagnosis not present

## 2014-11-13 ENCOUNTER — Ambulatory Visit: Admission: RE | Admit: 2014-11-13 | Payer: Medicare Other | Source: Ambulatory Visit | Admitting: Radiation Oncology

## 2014-11-13 ENCOUNTER — Ambulatory Visit
Admission: RE | Admit: 2014-11-13 | Discharge: 2014-11-13 | Disposition: A | Discharge status: Home / Self Care | Payer: Medicare Other | Source: Ambulatory Visit | Attending: Radiation Oncology | Admitting: Radiation Oncology

## 2014-11-13 DIAGNOSIS — C50912 Malignant neoplasm of unspecified site of left female breast: Secondary | ICD-10-CM | POA: Diagnosis not present

## 2014-11-13 DIAGNOSIS — Z510 Encounter for antineoplastic radiation therapy: Secondary | ICD-10-CM | POA: Diagnosis not present

## 2014-11-13 DIAGNOSIS — C50412 Malignant neoplasm of upper-outer quadrant of left female breast: Secondary | ICD-10-CM | POA: Diagnosis not present

## 2014-11-14 ENCOUNTER — Ambulatory Visit
Admission: RE | Admit: 2014-11-14 | Discharge: 2014-11-14 | Disposition: A | Discharge status: Home / Self Care | Payer: Medicare Other | Source: Ambulatory Visit | Attending: Radiation Oncology | Admitting: Radiation Oncology

## 2014-11-14 DIAGNOSIS — C50412 Malignant neoplasm of upper-outer quadrant of left female breast: Secondary | ICD-10-CM | POA: Diagnosis not present

## 2014-11-14 DIAGNOSIS — C50912 Malignant neoplasm of unspecified site of left female breast: Secondary | ICD-10-CM | POA: Diagnosis not present

## 2014-11-14 DIAGNOSIS — Z510 Encounter for antineoplastic radiation therapy: Secondary | ICD-10-CM | POA: Diagnosis not present

## 2014-11-15 ENCOUNTER — Ambulatory Visit
Admission: RE | Admit: 2014-11-15 | Discharge: 2014-11-15 | Disposition: A | Discharge status: Home / Self Care | Payer: Medicare Other | Source: Ambulatory Visit | Attending: Radiation Oncology | Admitting: Radiation Oncology

## 2014-11-15 DIAGNOSIS — C50912 Malignant neoplasm of unspecified site of left female breast: Secondary | ICD-10-CM | POA: Diagnosis not present

## 2014-11-15 DIAGNOSIS — C50412 Malignant neoplasm of upper-outer quadrant of left female breast: Secondary | ICD-10-CM

## 2014-11-15 DIAGNOSIS — Z510 Encounter for antineoplastic radiation therapy: Secondary | ICD-10-CM | POA: Diagnosis not present

## 2014-11-16 ENCOUNTER — Ambulatory Visit
Admission: RE | Admit: 2014-11-16 | Discharge: 2014-11-16 | Disposition: A | Discharge status: Home / Self Care | Payer: Medicare Other | Source: Ambulatory Visit | Attending: Radiation Oncology | Admitting: Radiation Oncology

## 2014-11-16 ENCOUNTER — Other Ambulatory Visit: Payer: Self-pay | Admitting: Family Medicine

## 2014-11-16 DIAGNOSIS — Z510 Encounter for antineoplastic radiation therapy: Secondary | ICD-10-CM | POA: Diagnosis not present

## 2014-11-16 DIAGNOSIS — C50912 Malignant neoplasm of unspecified site of left female breast: Secondary | ICD-10-CM | POA: Diagnosis not present

## 2014-11-16 NOTE — Telephone Encounter (Signed)
Medication was refilled 11/09/14.  Notified patient of this via voicemail and told patient to please return call if there were any issues or she was unable to get medication.

## 2014-11-16 NOTE — Telephone Encounter (Signed)
Patient requested refill too soon.  Notified patient via voicemail and stated for patient to return call if she was unable to get medication or if there was an issue.

## 2014-11-19 ENCOUNTER — Ambulatory Visit
Admission: RE | Admit: 2014-11-19 | Discharge: 2014-11-19 | Disposition: A | Discharge status: Home / Self Care | Payer: Medicare Other | Source: Ambulatory Visit | Attending: Radiation Oncology | Admitting: Radiation Oncology

## 2014-11-19 ENCOUNTER — Telehealth: Payer: Self-pay | Admitting: Family Medicine

## 2014-11-19 DIAGNOSIS — C50912 Malignant neoplasm of unspecified site of left female breast: Secondary | ICD-10-CM | POA: Diagnosis not present

## 2014-11-19 DIAGNOSIS — Z510 Encounter for antineoplastic radiation therapy: Secondary | ICD-10-CM | POA: Diagnosis not present

## 2014-11-19 MED ORDER — TRAMADOL HCL 50 MG PO TABS: 50.0000 mg | ORAL_TABLET | Freq: Four times a day (QID) | ORAL | Status: DC | PRN

## 2014-11-19 NOTE — Addendum Note (Signed)
Addended by: Davis Gourd on: 11/19/2014 04:29 PM   Modules accepted: Orders

## 2014-11-19 NOTE — Telephone Encounter (Signed)
Noted, med filled and faxed as requested.

## 2014-11-19 NOTE — Telephone Encounter (Signed)
Pt called stating that she has been having [facility] and she is not going to use them at all. Please remove from her list. [facility]. She will be out today but wants med sent to  [place], [place] [phone] (Phone) [phone] (Fax)        She states short term or 30 day meds sent to [facility] moving forward. She said [facility] for long term meds.

## 2014-11-20 ENCOUNTER — Ambulatory Visit
Admission: RE | Admit: 2014-11-20 | Discharge: 2014-11-20 | Disposition: A | Discharge status: Home / Self Care | Payer: Medicare Other | Source: Ambulatory Visit | Attending: Radiation Oncology | Admitting: Radiation Oncology

## 2014-11-20 DIAGNOSIS — C50912 Malignant neoplasm of unspecified site of left female breast: Secondary | ICD-10-CM | POA: Diagnosis not present

## 2014-11-20 DIAGNOSIS — Z510 Encounter for antineoplastic radiation therapy: Secondary | ICD-10-CM | POA: Diagnosis not present

## 2014-11-20 DIAGNOSIS — C50412 Malignant neoplasm of upper-outer quadrant of left female breast: Secondary | ICD-10-CM

## 2014-11-20 NOTE — Progress Notes (Signed)
Weekly Management Note Current Dose: 50.72  Gy  Projected Dose:  Gy   Narrative:  The patient presents for routine under treatment assessment.  CBCT/MVCT images/Port film x-rays were reviewed.  The chart was checked.  Physical Findings: Weight:  . Unchanged  Impression:  The patient is tolerating radiation.  Plan:  Continue treatment as planned.

## 2014-11-21 ENCOUNTER — Other Ambulatory Visit: Payer: Self-pay

## 2014-11-21 ENCOUNTER — Ambulatory Visit: Payer: Medicare Other

## 2014-11-21 ENCOUNTER — Ambulatory Visit
Admission: RE | Admit: 2014-11-21 | Discharge: 2014-11-21 | Disposition: A | Discharge status: Home / Self Care | Payer: Medicare Other | Source: Ambulatory Visit | Attending: Radiation Oncology | Admitting: Radiation Oncology

## 2014-11-21 DIAGNOSIS — Z510 Encounter for antineoplastic radiation therapy: Secondary | ICD-10-CM | POA: Diagnosis not present

## 2014-11-21 DIAGNOSIS — C50412 Malignant neoplasm of upper-outer quadrant of left female breast: Secondary | ICD-10-CM

## 2014-11-21 DIAGNOSIS — C50912 Malignant neoplasm of unspecified site of left female breast: Secondary | ICD-10-CM | POA: Diagnosis not present

## 2014-11-22 ENCOUNTER — Ambulatory Visit
Admission: RE | Admit: 2014-11-22 | Discharge: 2014-11-22 | Disposition: A | Discharge status: Home / Self Care | Payer: Medicare Other | Source: Ambulatory Visit | Attending: Radiation Oncology | Admitting: Radiation Oncology

## 2014-11-22 ENCOUNTER — Other Ambulatory Visit (HOSPITAL_BASED_OUTPATIENT_CLINIC_OR_DEPARTMENT_OTHER): Payer: Medicare Other

## 2014-11-22 ENCOUNTER — Telehealth: Payer: Self-pay | Admitting: Oncology

## 2014-11-22 ENCOUNTER — Ambulatory Visit: Payer: Medicare Other

## 2014-11-22 ENCOUNTER — Ambulatory Visit (HOSPITAL_BASED_OUTPATIENT_CLINIC_OR_DEPARTMENT_OTHER): Payer: Medicare Other | Admitting: Oncology

## 2014-11-22 VITALS — BP 132/73 | HR 61 | Temp 98.4°F | Resp 18 | Ht 71.0 in | Wt 145.9 lb

## 2014-11-22 DIAGNOSIS — Z853 Personal history of malignant neoplasm of breast: Secondary | ICD-10-CM

## 2014-11-22 DIAGNOSIS — M8580 Other specified disorders of bone density and structure, unspecified site: Secondary | ICD-10-CM

## 2014-11-22 DIAGNOSIS — C50412 Malignant neoplasm of upper-outer quadrant of left female breast: Secondary | ICD-10-CM

## 2014-11-22 LAB — COMPREHENSIVE METABOLIC PANEL (CC13)
ALBUMIN: 3.3 g/dL — AB (ref 3.5–5.0)
ALK PHOS: 61 U/L (ref 40–150)
ALT: 28 U/L (ref 0–55)
AST: 20 U/L (ref 5–34)
Anion Gap: 6 mEq/L (ref 3–11)
BUN: 19.9 mg/dL (ref 7.0–26.0)
CHLORIDE: 111 meq/L — AB (ref 98–109)
CO2: 25 mEq/L (ref 22–29)
Calcium: 8.6 mg/dL (ref 8.4–10.4)
Creatinine: 0.9 mg/dL (ref 0.6–1.1)
EGFR: 62 mL/min/{1.73_m2} — AB (ref >90)
Glucose: 97 mg/dl (ref 70–140)
POTASSIUM: 4 meq/L (ref 3.5–5.1)
Sodium: 142 mEq/L (ref 136–145)
Total Bilirubin: 0.4 mg/dL (ref 0.20–1.20)
Total Protein: 6 g/dL — ABNORMAL LOW (ref 6.4–8.3)

## 2014-11-22 LAB — CBC WITH DIFFERENTIAL/PLATELET
BASO%: 0.6 % (ref 0.0–2.0)
BASOS ABS: 0 10*3/uL (ref 0.0–0.1)
EOS ABS: 0 10*3/uL (ref 0.0–0.5)
EOS%: 0.1 % (ref 0.0–7.0)
HCT: 40.4 % (ref 34.8–46.6)
HGB: 13.8 g/dL (ref 11.6–15.9)
LYMPH%: 14 % (ref 14.0–49.7)
MCH: 32.1 pg (ref 25.1–34.0)
MCHC: 34.1 g/dL (ref 31.5–36.0)
MCV: 94 fL (ref 79.5–101.0)
MONO#: 0.4 10*3/uL (ref 0.1–0.9)
MONO%: 8.3 % (ref 0.0–14.0)
NEUT#: 3.7 10*3/uL (ref 1.5–6.5)
NEUT%: 77 % — AB (ref 38.4–76.8)
Platelets: 212 10*3/uL (ref 145–400)
RBC: 4.3 10*6/uL (ref 3.70–5.45)
RDW: 12.8 % (ref 11.2–14.5)
WBC: 4.8 10*3/uL (ref 3.9–10.3)
lymph#: 0.7 10*3/uL — ABNORMAL LOW (ref 0.9–3.3)

## 2014-11-22 MED ORDER — TAMOXIFEN CITRATE 20 MG PO TABS: 20.0000 mg | ORAL_TABLET | Freq: Every day | ORAL | Status: DC

## 2014-11-22 NOTE — Progress Notes (Signed)
[facility] [place]  [phone] [phone]     ID: [name] OB: [birth year]  MR#: [id number]  QHU#:[id number]  PCP: Annye Asa, MD GYN:   SU: Stark Klein OTHER MD: Thea Silversmith, Charlena Cross   CHIEF COMPLAINT: Estrogen receptor positive breast cancer  TREATMENT: Tamoxifen  BREAST CANCER HISTORY: From the initial intake note 06/14/2013:  [name] had screening mammography with tomography at [facility] 05/26/2013 showing some architectural distortion in the left breast superiorly. Ultrasound of the left breast 06/02/2013 showed a 2 cm irregular mass superiorly in the left breast, medial to the scar on the skin from a prior biopsy. This mass was palpable. 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  On 06/13/2013 the patient underwent bilateral breast MRI. This showed a 2.8 cm irregular enhancing mass in the superior left breast but no additional suspicious masses, and no abnormal adenopathy, and no findings in the right breast.  The patient's subsequent history is as detailed below.  INTERVAL HISTORY: Holck returns today for followup of her estrogen receptor positive breast cancer. She completes her radiation treatments today. Generally she has done well, with no desquamation and only some sensitivity around the nipple. She did fine she got tired more easily and she is no longer playing cards in the evening as she did before. She gets to bed by 9. She continues on tamoxifen. She does not have hot flashes or vaginal wetness. She obtains a drug at an excellent price  REVIEW OF SYSTEMS: For exercise [name] mostly walks and plays golf. A detailed review of systems today was otherwise entirely negative.  PAST MEDICAL  HISTORY: Past Medical History  Diagnosis Date  . Cough variant asthma   . Osteopenia   . Stricture and stenosis of cervix   . Postmenopausal   . GERD (gastroesophageal reflux disease)   . Disease of pharynx or nasopharynx   . Elevated BP   . Neoplasm   . Personal history of malignant neoplasm of bronchus and lung   . Allergic rhinitis, cause unspecified   . Diverticulosis   . Internal hemorrhoids   . Hiatal hernia   . Hypertension   . Laryngospasm   . 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 Hyperlipidemia   . Glaucoma   . Laryngospasm   . Multinodular goiter   . Chronic cough   . 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 Lung cancer surgery  2003    resection carcinoid lingula-lt upper lobe  . Colonoscopy    . Radioactive seed guided mastectomy with axillary sentinel lymph node biopsy Left 06/26/2014    Procedure: RADIOACTIVE SEED GUIDED PARTIAL MASTECTOMY WITH AXILLARY SENTINEL LYMPH NODE BIOPSY;  Surgeon: Stark Klein, MD;  Location: [facility];  Service: General;  Laterality: Left;  . Breast surgery    . Re-excision of breast lumpectomy Left 08/07/2014    Procedure: RE-EXCISION OF LEFT BREAST LUMPECTOMY;  Surgeon: Stark Klein, MD;  Location: [facility];  Service: General;  Laterality: Left;    FAMILY HISTORY Family History  Problem Relation Age of Onset  . Stroke Mother   . Hypertension Mother   . Hyperlipidemia Mother   . Stroke Father   .  Transient ischemic attack Father   . Hyperlipidemia Father   . Hypertension Father    the patient's father died from pneumonia the age of 32, in the setting of dementia. The patient's mother died at the age of 74 following a stroke. The patient had one brother, 4 sisters. There is no history of breast or ovarian cancer in the family  GYNECOLOGIC HISTORY:  Menarche age 10, first live birth age 35, she is [place] P2. She did  not use hormone replacement at menopause. She never took birth control pills.   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 She does a little bit of swelling on the side. Her husband [name] is retired from the [employer] (Designer, fashion/clothing) and then worked for [employer]. He is an avid Animator. Son [name] lives in [place] and is an Art gallery manager. Daughter [name] lives in [place] and is a Astronomer. The patient has 5 grandchildren. She attends the [employer]    ADVANCED DIRECTIVES: In place   HEALTH MAINTENANCE: Social History  Substance Use Topics  . Smoking status: Former Smoker    Quit date: 06/15/1963  . Smokeless tobacco: Not on file  . Alcohol Use: Yes     Comment: occasional wine     Colonoscopy:  PAP:  Bone density: 07/12/2013 at [facility], T score of -1.8 at the femoral neck  Lipid panel:  Allergies  Allergen Reactions  . Combigan [Brimonidine Tartrate-Timolol]     Eyes itch, reddened  . Sulfa Antibiotics Rash    Current Outpatient Prescriptions  Medication Sig Dispense Refill  . albuterol (VENTOLIN HFA) 108 (90 BASE) MCG/ACT inhaler Inhale 2 puffs into the lungs every 4 (four) hours as needed. (Patient taking differently: Inhale 2 puffs into the lungs as needed for wheezing or shortness of breath. ) 1 Inhaler 3  . aspirin EC 81 MG tablet Take 81 mg by mouth daily.    . bimatoprost (LUMIGAN) 0.03 % ophthalmic drops Place 1 drop into both eyes at bedtime.     . fluticasone-salmeterol (ADVAIR HFA) 230-21 MCG/ACT inhaler INHALE 2 PUFFS INTO THE LUNGS TWICE A DAY 3 Inhaler 2  . gabapentin (NEURONTIN) 400 MG capsule Take 1 capsule (400 mg total) by mouth 2 (two) times daily. 180 capsule 1  . loratadine (CLARITIN) 10 MG tablet Take 1 tablet (10 mg total) by mouth daily. 30 tablet 0  . Multiple Vitamins-Iron (MULTIVITAMIN/IRON) TABS Take 1 tablet by mouth daily.     . simvastatin (ZOCOR) 40 MG tablet Take 1 tablet (40 mg total) by mouth  at bedtime. 90 tablet 1  . tamoxifen (NOLVADEX) 20 MG tablet Take 1 tablet (20 mg total) by mouth daily. 90 tablet 4  . thyroid (ARMOUR THYROID) 15 MG tablet TAKE 1 TABLET (15 MG TOTAL) BY MOUTH DAILY. 90 tablet 1  . traMADol (ULTRAM) 50 MG tablet Take 1 tablet (50 mg total) by mouth every 6 (six) hours as needed. for pain 45 tablet 0  . valsartan (DIOVAN) 40 MG tablet Take 1 tablet (40 mg total) by mouth daily. 90 tablet 1   No current facility-administered medications for this visit.    OBJECTIVE: Middle-aged white woman who appears well Filed Vitals:   11/22/14 0913  BP: 132/73  Pulse: 61  Temp: 98.4 F (36.9 C)  Resp: 18     Body mass index is 20.36 kg/(m^2).    ECOG FS:0 - Asymptomatic  Sclerae unicteric, EOMs intact Oropharynx clear, dentition in good repair No  cervical or supraclavicular adenopathy Lungs no rales or rhonchi Heart regular rate and rhythm Abd soft, nontender, positive bowel sounds MSK no focal spinal tenderness, no upper extremity lymphedema Neuro: nonfocal, well oriented, appropriate affect Breasts: The right breast is benign. The left breast is status post lumpectomy and is currently receiving radiation. There is erythema but no desquamation. There are no palpable masses or any suggestion of disease recurrence. The cosmetic result is excellent. The left axilla is benign.  Marland Kitchen  LAB RESULTS:  CMP     Component Value Date/Time   NA 138 08/10/2014 1012   NA 142 06/13/2014 1036   K 4.2 08/10/2014 1012   K 4.5 06/13/2014 1036   CL 107 08/10/2014 1012   CO2 27 08/10/2014 1012   CO2 27 06/13/2014 1036   GLUCOSE 95 08/10/2014 1012   GLUCOSE 100 06/13/2014 1036   GLUCOSE 88 01/13/2006 1012   BUN 25* 08/10/2014 1012   BUN 22.3 06/13/2014 1036   CREATININE 0.95 08/10/2014 1012   CREATININE 0.9 06/13/2014 1036   CALCIUM 8.9 08/10/2014 1012   CALCIUM 8.7 06/13/2014 1036   PROT 6.2 08/10/2014 1012   PROT 6.5 06/13/2014 1036   ALBUMIN 3.7 08/10/2014 1012    ALBUMIN 3.7 06/13/2014 1036   AST 17 08/10/2014 1012   AST 21 06/13/2014 1036   ALT 13 08/10/2014 1012   ALT 18 06/13/2014 1036   ALKPHOS 51 08/10/2014 1012   ALKPHOS 61 06/13/2014 1036   BILITOT 0.5 08/10/2014 1012   BILITOT 0.54 06/13/2014 1036   GFRNONAA 57* 08/01/2014 1417   GFRAA >60 08/01/2014 1417    I No results found for: SPEP  Lab Results  Component Value Date   WBC 4.8 11/22/2014   NEUTROABS 3.7 11/22/2014   HGB 13.8 11/22/2014   HCT 40.4 11/22/2014   MCV 94.0 11/22/2014   PLT 212 11/22/2014      Chemistry      Component Value Date/Time   NA 138 08/10/2014 1012   NA 142 06/13/2014 1036   K 4.2 08/10/2014 1012   K 4.5 06/13/2014 1036   CL 107 08/10/2014 1012   CO2 27 08/10/2014 1012   CO2 27 06/13/2014 1036   BUN 25* 08/10/2014 1012   BUN 22.3 06/13/2014 1036   CREATININE 0.95 08/10/2014 1012   CREATININE 0.9 06/13/2014 1036      Component Value Date/Time   CALCIUM 8.9 08/10/2014 1012   CALCIUM 8.7 06/13/2014 1036   ALKPHOS 51 08/10/2014 1012   ALKPHOS 61 06/13/2014 1036   AST 17 08/10/2014 1012   AST 21 06/13/2014 1036   ALT 13 08/10/2014 1012   ALT 18 06/13/2014 1036   BILITOT 0.5 08/10/2014 1012   BILITOT 0.54 06/13/2014 1036       No results found for: LABCA2  No components found for: LABCA125  No results for input(s): INR in the last 168 hours.  Urinalysis    Component Value Date/Time   COLORURINE yellow 05/20/2007 0942   APPEARANCEUR Clear 05/20/2007 0942   LABSPEC <1.005 05/20/2007 0942   PHURINE 7.5 05/20/2007 0942   HGBUR negative 05/20/2007 0942   BILIRUBINUR negative 05/20/2007 0942   UROBILINOGEN 0.2 05/20/2007 0942   NITRITE negative 05/20/2007 0942    STUDIES: No results found.  ASSESSMENT: 74 y.o. Stark woman status post left breast biopsy for 08/19/2013 for a clinical T2 N0, stage IIA invasive lobular breast cancer, estrogen receptor positive, progesterone receptor negative, with an MIB-1 of 20%. There was  not sufficient  tissue for HER-2 testing.  (1) neoadjuvant anastrozole started mid April 2015, discontinued early June 2015 with poor tolerance  (2) tamoxifen started first week in July 2015  (3) osteopenia with a T score of -1.8 at the femoral necks 07/12/2013  (4) left lumpectomy 06/26/2014 showed a pT1c pN0, stage IA invasive lobular carcinoma, grade 1, HER-2 negative, with positive margins   (a) additional surgery 08/07/2014 did not completely clear margins, but no further surgery was advised  (5) radiation completed 11/22/2014  (6) continue tamoxifen to total 10 years   PLAN: Karp completes her radiation today. She has done very well with that. She is also doing excellently with tamoxifen.  Currently the plan is to continue tamoxifen for total of 10 years. However she will have the opportunity to change that at 2 years and 5 years. I will make sure to see her at those possible transition points  We discussed the fact that normal follow-up for someone like her would be every 3 months for the first 2 years and every 6 months for 3 years then yearly for 5 years. However she really is not only doing very well but is also very competent and she will let us know if any problems develop.  She will be seeing Dr. Pablo Ledger in about a month for radiation follow-up and she will see Dr. Birdie Riddle shortly after that. Accordingly she will return to see [name] in April 2017, after her marrow March mammography. If she then sees Dr. Barry Dienes in July 2017 and Dr. Birdie Riddle in October 2017, we can start seeing her on an every 6 month basis thereafter (which means she would see [name] here every April)  She is very favorable this plan. She will call with any problems that may develop before her next visit here.     Chauncey Cruel, MD   11/22/2014 9:40 AM

## 2014-11-22 NOTE — Telephone Encounter (Signed)
Mailed calender for April 2017

## 2014-11-23 ENCOUNTER — Ambulatory Visit
Admission: RE | Admit: 2014-11-23 | Discharge: 2014-11-23 | Disposition: A | Discharge status: Home / Self Care | Payer: Medicare Other | Source: Ambulatory Visit | Attending: Radiation Oncology | Admitting: Radiation Oncology

## 2014-11-23 ENCOUNTER — Telehealth: Payer: Self-pay | Admitting: *Deleted

## 2014-11-23 DIAGNOSIS — C50912 Malignant neoplasm of unspecified site of left female breast: Secondary | ICD-10-CM | POA: Diagnosis not present

## 2014-11-23 DIAGNOSIS — Z510 Encounter for antineoplastic radiation therapy: Secondary | ICD-10-CM | POA: Diagnosis not present

## 2014-11-23 DIAGNOSIS — H4011X3 Primary open-angle glaucoma, severe stage: Secondary | ICD-10-CM | POA: Diagnosis not present

## 2014-11-23 NOTE — Telephone Encounter (Signed)
CALLED PATIENT TO INFORM OF SURVIVORSHIP PROGRAM APPT. ON 11-29-14, LVM FOR A RETURN CALL

## 2014-11-29 ENCOUNTER — Encounter: Payer: Self-pay | Admitting: Radiation Oncology

## 2014-11-29 ENCOUNTER — Encounter: Payer: Self-pay | Admitting: Nurse Practitioner

## 2014-11-29 NOTE — Progress Notes (Signed)
  Radiation Oncology         [phone] ________________________________  Name: Patricia [name] MRN: [id number]  Date: 11/29/2014  DOB: 1940-12-16  End of Treatment Note  Diagnosis:   Breast cancer of upper-outer quadrant of left female breast   Staging form: Breast, AJCC 7th Edition     Clinical: Stage IIA (T2, N0, cM0) - Unsigned       Staging comments: Staged at breast conference 06/14/13      Pathologic stage from 06/28/2014: Stage IA (yT1c, N0, cM0) - Signed by Enid Cutter, MD on 07/12/2014       Staging comments: Staged on final lumpectomy specimen by Dr. Donato Heinz     Indication for treatment:  Curative     Radiation treatment dates: 10/22/2014-11/23/2014  Site/dose:   Left breast/ 42.72 Gy at 2.67 Gy per fraction x 16 fractions.  Left breast boost/ 12 Gy at 2 Gy per fraction x 6 fractions  Beams/energy:  Opposed tangents with 3D breath hold with 6/10 MV photons Enface/ 15 MeV  Narrative: The patient tolerated radiation treatment relatively well. She had minimal dermitis medially. She used provided Radiaplex cream to address this symptom.  Plan: The patient has completed radiation treatment. The patient will return to radiation oncology clinic for routine followup in one month. I advised them to call or return sooner if they have any questions or concerns related to their recovery or treatment.  This document serves as a record of services personally performed by Thea Silversmith , MD. It was created on her behalf by Lenn Cal, a trained medical scribe. The creation of this record is based on the scribe's personal observations and the Arden Axon's statements to them. This document has been checked and approved by the attending Kristine Chahal.   ------------------------------------------------  Thea Silversmith, MD

## 2014-11-29 NOTE — Progress Notes (Signed)
Name: Patricia [name]   MRN: [id number]  Date:  11/08/2014   DOB: August 30, 1940  Status:outpatient    DIAGNOSIS: Breast cancer of upper-outer quadrant of left female breast   Staging form: Breast, AJCC 7th Edition     Clinical: Stage IIA (T2, N0, cM0) - Unsigned       Staging comments: Staged at breast conference 06/14/13      Pathologic stage from 06/28/2014: Stage IA (yT1c, N0, cM0) - Signed by Enid Cutter, MD on 07/12/2014       Staging comments: Staged on final lumpectomy specimen by Dr. Donato Heinz    CONSENT VERIFIED: yes   SET UP: Patient is setup supine   IMMOBILIZATION:  The following immobilization was used:Custom Moldable Pillow, breast board.   NARRATIVE: Patricia [name] underwent complex simulation and treatment planning for her boost treatment today.  Her tumor volume was outlined on the planning CT scan. The depth of her cavity was felt to be appropriate for treatment with electrons    15  MeV electrons will be prescribed to the 100%  isodose line.   I personally oversaw and approved the construction of a unique block which will be used for beam modification purposes.  An isodose plan is requested.   This document serves as a record of services personally performed by Thea Silversmith , MD. It was created on her behalf by Lenn Cal, a trained medical scribe. The creation of this record is based on the scribe's personal observations and the provider's statements to them. This document has been checked and approved by the attending provider.   ------------------------------------------------  Thea Silversmith, MD

## 2014-11-29 NOTE — Progress Notes (Signed)
Radiation Oncology         [phone] ________________________________  Name: Brylinn Teaney      MRN: [id number]          Date: 10/11/2014           DOB: 24-Mar-1940  Optical Surface Tracking Plan:  Since intensity modulated radiotherapy (IMRT) and 3D conformal radiation treatment methods are predicated on accurate and precise positioning for treatment, intrafraction motion monitoring is medically necessary to ensure accurate and safe treatment delivery.  The ability to quantify intrafraction motion without excessive ionizing radiation dose can only be performed with optical surface tracking. Accordingly, surface imaging offers the opportunity to obtain 3D measurements of patient position throughout IMRT and 3D treatments without excessive radiation exposure.  I am ordering optical surface tracking for this patient's upcoming course of radiotherapy. ________________________________ Signature   Reference:   Ursula Alert, J, et al. Surface imaging-based analysis of intrafraction motion for breast radiotherapy patients.Journal of [place], n. 6, nov. 2014. ISSN [id number].   Available at: <http://www.jacmp.org/index.php/jacmp/article/view/4957>.   This document serves as a record of services personally performed by Thea Silversmith , MD. It was created on her behalf by Lenn Cal, a trained medical scribe. The creation of this record is based on the scribe's personal observations and the provider's statements to them. This document has been checked and approved by the attending provider.   ------------------------------------------------  Thea Silversmith, MD

## 2014-12-04 ENCOUNTER — Telehealth: Payer: Self-pay | Admitting: *Deleted

## 2014-12-04 NOTE — Telephone Encounter (Signed)
Called pt to reschedule missed appt with Survivorship Program. Pt declines an appt at this time states she is doing fine. She does not want to have any meetings with anyone until April with Dr. [name]. Pt would like the care plan mailed to her address.

## 2014-12-05 ENCOUNTER — Encounter: Payer: Self-pay | Admitting: Nurse Practitioner

## 2014-12-05 NOTE — Progress Notes (Signed)
The Survivorship Care Plan was mailed to Patricia [name] as she reported not being able to come in to the Survivorship Clinic for an in-person visit at this time. A letter was mailed to her outlining the purpose of the content of the care plan, as well as encouraging her to reach out to me with any questions or concerns.  My business card was included in the correspondence to the patient as well.  A copy of the care plan was also routed/faxed/mailed to Patricia Asa, MD, the patient's PCP.  I will not be placing any follow-up appointments to the Survivorship Clinic for Patricia [name], but I am happy to see her at any time in the future for any survivorship concerns that may arise. Thank you for allowing me to participate in her care!  Kenn File, [place] [phone]

## 2014-12-11 ENCOUNTER — Other Ambulatory Visit: Payer: Self-pay | Admitting: Family Medicine

## 2014-12-11 NOTE — Telephone Encounter (Signed)
Medication filled to pharmacy as requested.   

## 2014-12-20 ENCOUNTER — Ambulatory Visit
Admission: RE | Admit: 2014-12-20 | Discharge: 2014-12-20 | Disposition: A | Discharge status: Home / Self Care | Payer: Medicare Other | Source: Ambulatory Visit | Attending: Radiation Oncology | Admitting: Radiation Oncology

## 2014-12-20 ENCOUNTER — Encounter: Payer: Self-pay | Admitting: Radiation Oncology

## 2014-12-20 VITALS — BP 125/62 | HR 70 | Resp 16 | Wt 145.3 lb

## 2014-12-20 DIAGNOSIS — C50412 Malignant neoplasm of upper-outer quadrant of left female breast: Secondary | ICD-10-CM

## 2014-12-20 NOTE — Progress Notes (Signed)
Department of Radiation Oncology  Phone:  [phone] Fax:        [phone]   Name: Patricia [name] MRN: [id number]  DOB: [birth year]  Date: 12/20/2014  Follow Up Visit Note  Diagnosis: Breast cancer of upper-outer quadrant of left female breast [facility])   Staging form: Breast, AJCC 7th Edition     Clinical: Stage IIA (T2, N0, cM0) - Unsigned       Staging comments: Staged at breast conference 06/14/13      Pathologic stage from 06/28/2014: Stage IA (yT1c, N0, cM0) - Signed by Enid Cutter, MD on 07/12/2014       Staging comments: Staged on final lumpectomy specimen by Dr. Donato Heinz    Summary and Interval since last radiation: The patient's radiation treatment dates extended from 10/22/2014-11/23/2014. The site and dose included the left breast at 42.72 Gy at 2.67 Gy per fraction x 16 fractions and the left breast boost at 12 Gy at 2 Gy per fraction x 6 fractions. The beams and energy includes Opposed tangents with 3D breath hold with 6/10 MV photons and Enface/ 15 MeV.  Interval History: Patricia [name] presents today for routine followup. Her weight and vitals are stable. She denies symptoms of  pain. Reports she is disappointed her energy level has yet to return to normal. However, patient reports she played 18 holes of golf yesterday without a break. Reports occasional sharp shooting pain in her left/treated breast continues. Denies nipple discharge from her left breast. Denies palpating any abnormalities in her left breast. Full ROM of both upper extremities demonstrated. No edema noted in either upper extremity. Reports skin within treatment field remains slightly hyperpigmented. She reports administering Tamoxifen. "I'm just disappointed with my energy level. I'm kind of a high energy person and low energy is not my thing," the patient stated. She also vocalized that her nipple looks different. This address was concerned.   Physical Exam:  Filed Vitals:   12/20/14 1455  BP: 125/62  Pulse: 70    Resp: 16  Weight: 145 lb 4.8 oz (65.908 kg)  SpO2: 100%  The patient is alert and oriented. There is no significant changes to the status of overall health to be noted at this time. Everted nipple on the left with some volume loss. Minor skin darkening superiorly.   IMPRESSION: Patricia [name] is a 74 y.o. female presenting to clinic in regards to her breast cancer of upper-outer quadrant of left female breast ([place]). She is recovering well from radiation treatments and understands the importance of receiving annual mammograms. She understands the importance of using sun protection and the benefits of applying vitamin E cream. She is managing reports symptoms appropriately and administering Tamoxifen approrpriatley. The patient understands that she can access her appointments and medical records via [place].   PLAN: She is doing well. We discussed the need for follow up every 4-6 months which she has scheduled.  We discussed the need for yearly mammograms which she can schedule with her OBGYN or with medical oncology. We discussed the need for sun protection in the treated area. She can always call me with questions.  I will follow up with her on an as needed basis. Healthy methods of management in regards to reported symptoms were reviewed in detail. The patient is advised of her appointment with Leo Rod, NP to take place as schduled and of her appointment with Dr. Birdie Riddle, MD in December of 2016. All vocalized questions and concerns have been addressed. If the patient develops any further  questions or concerns in regards to her treatment and recovery, she has been encouraged to contact Dr. Pablo Ledger, MD.   This document serves as a record of services personally performed by Thea Silversmith , MD. It was created on her behalf by Lenn Cal, a trained medical scribe. The creation of this record is based on the scribe's personal observations and the provider's statements to them. This document has been  checked and approved by the attending provider.   ------------------------------------------------  Thea Silversmith, MD

## 2014-12-20 NOTE — Progress Notes (Signed)
Weight and vitals stable. Denies pain. Reports she is disappointed her energy level has yet to return to normal. However, patient reports she played 18 holes of golf yesterday without a break. Reports occasional sharp shooting pain in her left/treated breast continues. Denies nipple discharge from her left breast. Denies palpating any abnormalities in her left breast. Full ROM of both upper extremities demonstrated. No edema noted in either upper extremity. Reports skin within treatment field remains slightly hyperpigmented.   BP 125/62 mmHg  Pulse 70  Resp 16  Wt 145 lb 4.8 oz (65.908 kg)  SpO2 100% Wt Readings from Last 3 Encounters:  12/20/14 145 lb 4.8 oz (65.908 kg)  11/22/14 145 lb 14.4 oz (66.18 kg)  11/06/14 146 lb 4.8 oz (66.361 kg)

## 2014-12-31 ENCOUNTER — Other Ambulatory Visit: Payer: Self-pay | Admitting: Family Medicine

## 2014-12-31 ENCOUNTER — Telehealth: Payer: Self-pay | Admitting: General Practice

## 2014-12-31 MED ORDER — TRAMADOL HCL 50 MG PO TABS: 50.0000 mg | ORAL_TABLET | Freq: Four times a day (QID) | ORAL | Status: DC | PRN

## 2014-12-31 NOTE — Telephone Encounter (Signed)
Ok for #45

## 2014-12-31 NOTE — Telephone Encounter (Signed)
This prescription was filled last week by your office. Pt is now requesting it be filled to [facility]. Can you please resend?

## 2014-12-31 NOTE — Telephone Encounter (Signed)
Last OV 08/10/14 Tramadol last filled 11-19-14 #45 with 0

## 2014-12-31 NOTE — Telephone Encounter (Signed)
Caller name: Patricia [name]   Relationship to patient: Self  Can be reached: [phone]  Pharmacy: [facility]   Reason for call: pt is requesting a refill on her tamoxifen Rx.

## 2014-12-31 NOTE — Telephone Encounter (Signed)
Medication filled to pharmacy as requested.   

## 2015-01-01 ENCOUNTER — Other Ambulatory Visit: Payer: Self-pay | Admitting: *Deleted

## 2015-01-01 DIAGNOSIS — C50412 Malignant neoplasm of upper-outer quadrant of left female breast: Secondary | ICD-10-CM

## 2015-01-01 MED ORDER — TAMOXIFEN CITRATE 20 MG PO TABS: 20.0000 mg | ORAL_TABLET | Freq: Every day | ORAL | Status: DC

## 2015-01-01 NOTE — Telephone Encounter (Signed)
Please do

## 2015-01-14 ENCOUNTER — Other Ambulatory Visit: Payer: Self-pay | Admitting: Family Medicine

## 2015-01-15 NOTE — Telephone Encounter (Signed)
done

## 2015-02-13 ENCOUNTER — Telehealth: Payer: Self-pay | Admitting: Behavioral Health

## 2015-02-13 NOTE — Telephone Encounter (Signed)
Unable to reach patient at time of Pre-Visit Call.  Left message for patient to return call when available.    

## 2015-02-14 ENCOUNTER — Encounter: Payer: Self-pay | Admitting: Family Medicine

## 2015-02-14 ENCOUNTER — Other Ambulatory Visit: Payer: Self-pay | Admitting: General Practice

## 2015-02-14 ENCOUNTER — Ambulatory Visit (INDEPENDENT_AMBULATORY_CARE_PROVIDER_SITE_OTHER): Payer: Medicare Other | Admitting: Family Medicine

## 2015-02-14 VITALS — BP 122/68 | HR 68 | Temp 98.1°F | Resp 16 | Ht 59.0 in | Wt 145.5 lb

## 2015-02-14 DIAGNOSIS — Z0000 Encounter for general adult medical examination without abnormal findings: Secondary | ICD-10-CM

## 2015-02-14 DIAGNOSIS — E785 Hyperlipidemia, unspecified: Secondary | ICD-10-CM | POA: Diagnosis not present

## 2015-02-14 DIAGNOSIS — M8580 Other specified disorders of bone density and structure, unspecified site: Secondary | ICD-10-CM | POA: Diagnosis not present

## 2015-02-14 DIAGNOSIS — E032 Hypothyroidism due to medicaments and other exogenous substances: Secondary | ICD-10-CM

## 2015-02-14 DIAGNOSIS — I10 Essential (primary) hypertension: Secondary | ICD-10-CM | POA: Diagnosis not present

## 2015-02-14 LAB — BASIC METABOLIC PANEL
BUN: 22 mg/dL (ref 6–23)
CHLORIDE: 105 meq/L (ref 96–112)
CO2: 28 meq/L (ref 19–32)
CREATININE: 0.98 mg/dL (ref 0.40–1.20)
Calcium: 9.1 mg/dL (ref 8.4–10.5)
GFR: 58.91 mL/min — ABNORMAL LOW (ref >60.00)
Glucose, Bld: 98 mg/dL (ref 70–99)
Potassium: 4.4 mEq/L (ref 3.5–5.1)
Sodium: 140 mEq/L (ref 135–145)

## 2015-02-14 LAB — CBC WITH DIFFERENTIAL/PLATELET
BASOS PCT: 0.3 % (ref 0.0–3.0)
Basophils Absolute: 0 10*3/uL (ref 0.0–0.1)
EOS ABS: 0 10*3/uL (ref 0.0–0.7)
Eosinophils Relative: 0.1 % (ref 0.0–5.0)
HCT: 42.8 % (ref 36.0–46.0)
HEMOGLOBIN: 14.4 g/dL (ref 12.0–15.0)
Lymphocytes Relative: 21.5 % (ref 12.0–46.0)
Lymphs Abs: 1.3 10*3/uL (ref 0.7–4.0)
MCHC: 33.8 g/dL (ref 30.0–36.0)
MCV: 94.2 fl (ref 78.0–100.0)
MONO ABS: 0.4 10*3/uL (ref 0.1–1.0)
Monocytes Relative: 6.5 % (ref 3.0–12.0)
NEUTROS ABS: 4.4 10*3/uL (ref 1.4–7.7)
NEUTROS PCT: 71.6 % (ref 43.0–77.0)
PLATELETS: 254 10*3/uL (ref 150.0–400.0)
RBC: 4.54 Mil/uL (ref 3.87–5.11)
RDW: 12.7 % (ref 11.5–15.5)
WBC: 6.2 10*3/uL (ref 4.0–10.5)

## 2015-02-14 LAB — LIPID PANEL
Cholesterol: 192 mg/dL (ref 0–200)
HDL: 70 mg/dL (ref >39.00)
LDL CALC: 105 mg/dL — AB (ref 0–99)
NONHDL: 122.15
Total CHOL/HDL Ratio: 3
Triglycerides: 88 mg/dL (ref 0.0–149.0)
VLDL: 17.6 mg/dL (ref 0.0–40.0)

## 2015-02-14 LAB — HEPATIC FUNCTION PANEL
ALT: 15 U/L (ref 0–35)
AST: 17 U/L (ref 0–37)
Albumin: 4 g/dL (ref 3.5–5.2)
Alkaline Phosphatase: 56 U/L (ref 39–117)
BILIRUBIN TOTAL: 0.5 mg/dL (ref 0.2–1.2)
Bilirubin, Direct: 0.1 mg/dL (ref 0.0–0.3)
Total Protein: 6.6 g/dL (ref 6.0–8.3)

## 2015-02-14 LAB — TSH: TSH: 5.63 u[IU]/mL — ABNORMAL HIGH (ref 0.35–4.50)

## 2015-02-14 LAB — VITAMIN D 25 HYDROXY (VIT D DEFICIENCY, FRACTURES): VITD: 33.77 ng/mL (ref 30.00–100.00)

## 2015-02-14 MED ORDER — ARMOUR THYROID 30 MG PO TABS: 30.0000 mg | ORAL_TABLET | Freq: Every day | ORAL | Status: DC

## 2015-02-14 NOTE — Assessment & Plan Note (Signed)
Chronic problem.  Asymptomatic.  Check labs.  Adjust meds prn  

## 2015-02-14 NOTE — Assessment & Plan Note (Signed)
Pt's PE unchanged from previous.  UTD on colonoscopy, mammo, DEXA, vaccines.  Written screening schedule updated and given to pt.  Check labs.  Anticipatory guidance provided.

## 2015-02-14 NOTE — Progress Notes (Signed)
Pre visit review using our clinic review tool, if applicable. No additional management support is needed unless otherwise documented below in the visit note. 

## 2015-02-14 NOTE — Patient Instructions (Signed)
Follow up in 6 months to recheck BP and cholesterol We'll notify you of your lab results and make any changes if needed Keep up the good work on healthy diet and regular exercise- you look great!!! Call with any questions or concerns If you want to join [name] at the new [place] office, any scheduled appointments will automatically transfer and we will see you at [address] [name] [address] [place], [place] [address] ([place] 03/05/15) Happy Holidays!!!

## 2015-02-14 NOTE — Assessment & Plan Note (Signed)
Chronic problem.  UTD on DEXA.  Check Vit D level.  Replete prn. 

## 2015-02-14 NOTE — Assessment & Plan Note (Signed)
Chronic problem.  Tolerating statin w/o difficulty.  Check labs.  Adjust meds prn  

## 2015-02-14 NOTE — Assessment & Plan Note (Signed)
Chronic problem.  Well controlled.  Asymptomatic.  Check labs.  No anticipated med changes. 

## 2015-02-14 NOTE — Progress Notes (Signed)
   Subjective:    Patient ID: Patricia [name], female    DOB: [birth year], 74 y.o.   MRN: [id number]  HPI Here today for CPE.  Risk Factors: HTN- chronic problem, on Diovan daily w/ good BP control. Hyperlipidemia- chronic problem, on Simvastatin daily Hypothyroid- chronic problem, on Armour thyroid daily. Osteopenia- UTD on DEXA.  Due for Vit D Physical Activity: walking regularly Depression: denies current sxs Fall Risk: low Hearing: normal to conversational tones, mildly decreased to whispered voice ADL's: independent Cognitive: normal linear thought process, memory and attention intact Home Safety: safe at home Height, Weight, BMI, Visual Acuity: see vitals, vision corrected to 20/20 w/ glasses Counseling: UTD on mammo, DEXA, colonoscopy (due 2019).  UTD on vaccines. Care team reviewed and updated w/ pt Labs Ordered: See A&P Care Plan: See A&P    Review of Systems Patient reports no vision/ hearing changes, adenopathy,fever, weight change,  persistant/recurrent hoarseness , swallowing issues, chest pain, palpitations, edema, persistant/recurrent cough, hemoptysis, dyspnea (rest/exertional/paroxysmal nocturnal), gastrointestinal bleeding (melena, rectal bleeding), abdominal pain, significant heartburn, bowel changes, GU symptoms (dysuria, hematuria, incontinence), Gyn symptoms (abnormal  bleeding, pain),  syncope, focal weakness, memory loss, numbness & tingling, skin/hair/nail changes, abnormal bruising or bleeding, anxiety, or depression.     Objective:   Physical Exam General Appearance:    Alert, cooperative, no distress, appears stated age  Head:    Normocephalic, without obvious abnormality, atraumatic  Eyes:    PERRL, conjunctiva/corneas clear, EOM's intact, fundi    benign, both eyes  Ears:    Normal TM's and external ear canals, both ears  Nose:   Nares normal, septum midline, mucosa normal, no drainage    or sinus tenderness  Throat:   Lips, mucosa, and tongue normal;  teeth and gums normal  Neck:   Supple, symmetrical, trachea midline, no adenopathy;    Thyroid: no enlargement/tenderness/nodules  Back:     Symmetric, no curvature, ROM normal, no CVA tenderness  Lungs:     Clear to auscultation bilaterally, respirations unlabored  Chest Wall:    No tenderness or deformity   Heart:    Regular rate and rhythm, S1 and S2 normal, no murmur, rub   or gallop  Breast Exam:    Deferred to mammo  Abdomen:     Soft, non-tender, bowel sounds active all four quadrants,    no masses, no organomegaly  Genitalia:    Deferred  Rectal:    Extremities:   Extremities normal, atraumatic, no cyanosis or edema  Pulses:   2+ and symmetric all extremities  Skin:   Skin color, texture, turgor normal, no rashes or lesions  Lymph nodes:   Cervical, supraclavicular, and axillary nodes normal  Neurologic:   CNII-XII intact, normal strength, sensation and reflexes    throughout          Assessment & Plan:

## 2015-02-18 ENCOUNTER — Other Ambulatory Visit: Payer: Self-pay | Admitting: General Practice

## 2015-02-18 MED ORDER — TRAMADOL HCL 50 MG PO TABS: 50.0000 mg | ORAL_TABLET | Freq: Four times a day (QID) | ORAL | Status: DC | PRN

## 2015-02-18 NOTE — Telephone Encounter (Signed)
Medication filled to pharmacy as requested.   

## 2015-02-18 NOTE — Telephone Encounter (Signed)
Last OV 02/14/15 Tramadol last filled 12/31/14 #45 with 0

## 2015-02-27 ENCOUNTER — Encounter: Payer: Self-pay | Admitting: *Deleted

## 2015-04-01 DIAGNOSIS — C50412 Malignant neoplasm of upper-outer quadrant of left female breast: Secondary | ICD-10-CM | POA: Diagnosis not present

## 2015-04-03 ENCOUNTER — Other Ambulatory Visit: Payer: Self-pay | Admitting: Family Medicine

## 2015-04-03 NOTE — Telephone Encounter (Signed)
Medication filled to pharmacy as requested.   

## 2015-04-08 ENCOUNTER — Other Ambulatory Visit: Payer: Self-pay

## 2015-04-08 DIAGNOSIS — H401122 Primary open-angle glaucoma, left eye, moderate stage: Secondary | ICD-10-CM | POA: Insufficient documentation

## 2015-04-08 DIAGNOSIS — H401111 Primary open-angle glaucoma, right eye, mild stage: Secondary | ICD-10-CM | POA: Diagnosis not present

## 2015-04-08 MED ORDER — TRAMADOL HCL 50 MG PO TABS: 50.0000 mg | ORAL_TABLET | Freq: Four times a day (QID) | ORAL | Status: DC | PRN

## 2015-04-08 NOTE — Telephone Encounter (Signed)
Medication filled to pharmacy as requested.   

## 2015-04-10 ENCOUNTER — Other Ambulatory Visit: Payer: Self-pay | Admitting: Family Medicine

## 2015-04-11 ENCOUNTER — Other Ambulatory Visit: Payer: Self-pay

## 2015-04-11 MED ORDER — TRAMADOL HCL 50 MG PO TABS: 50.0000 mg | ORAL_TABLET | Freq: Four times a day (QID) | ORAL | Status: DC | PRN

## 2015-04-11 NOTE — Telephone Encounter (Signed)
Just wanted to make sure you printed this prescription and faxed today. Saw that per the chart it was printed this morning.

## 2015-04-27 ENCOUNTER — Inpatient Hospital Stay (HOSPITAL_COMMUNITY)
Admission: EM | Admit: 2015-04-27 | Discharge: 2015-05-02 | DRG: 308 | Disposition: A | Discharge status: Home / Self Care | Payer: Medicare Other | Attending: Internal Medicine | Admitting: Internal Medicine

## 2015-04-27 ENCOUNTER — Encounter (HOSPITAL_COMMUNITY): Payer: Self-pay | Admitting: Emergency Medicine

## 2015-04-27 ENCOUNTER — Emergency Department (HOSPITAL_COMMUNITY): Payer: Medicare Other

## 2015-04-27 DIAGNOSIS — M8580 Other specified disorders of bone density and structure, unspecified site: Secondary | ICD-10-CM | POA: Diagnosis present

## 2015-04-27 DIAGNOSIS — R0789 Other chest pain: Secondary | ICD-10-CM | POA: Diagnosis not present

## 2015-04-27 DIAGNOSIS — Z85118 Personal history of other malignant neoplasm of bronchus and lung: Secondary | ICD-10-CM | POA: Diagnosis not present

## 2015-04-27 DIAGNOSIS — J811 Chronic pulmonary edema: Secondary | ICD-10-CM | POA: Diagnosis present

## 2015-04-27 DIAGNOSIS — K219 Gastro-esophageal reflux disease without esophagitis: Secondary | ICD-10-CM | POA: Diagnosis not present

## 2015-04-27 DIAGNOSIS — I480 Paroxysmal atrial fibrillation: Principal | ICD-10-CM

## 2015-04-27 DIAGNOSIS — Z7982 Long term (current) use of aspirin: Secondary | ICD-10-CM

## 2015-04-27 DIAGNOSIS — J4522 Mild intermittent asthma with status asthmaticus: Secondary | ICD-10-CM

## 2015-04-27 DIAGNOSIS — Z923 Personal history of irradiation: Secondary | ICD-10-CM

## 2015-04-27 DIAGNOSIS — R4702 Dysphasia: Secondary | ICD-10-CM | POA: Diagnosis not present

## 2015-04-27 DIAGNOSIS — M47812 Spondylosis without myelopathy or radiculopathy, cervical region: Secondary | ICD-10-CM | POA: Diagnosis present

## 2015-04-27 DIAGNOSIS — J385 Laryngeal spasm: Secondary | ICD-10-CM | POA: Diagnosis not present

## 2015-04-27 DIAGNOSIS — J45901 Unspecified asthma with (acute) exacerbation: Secondary | ICD-10-CM | POA: Diagnosis not present

## 2015-04-27 DIAGNOSIS — E039 Hypothyroidism, unspecified: Secondary | ICD-10-CM | POA: Diagnosis present

## 2015-04-27 DIAGNOSIS — R0602 Shortness of breath: Secondary | ICD-10-CM | POA: Diagnosis not present

## 2015-04-27 DIAGNOSIS — H409 Unspecified glaucoma: Secondary | ICD-10-CM | POA: Diagnosis present

## 2015-04-27 DIAGNOSIS — R471 Dysarthria and anarthria: Secondary | ICD-10-CM | POA: Diagnosis not present

## 2015-04-27 DIAGNOSIS — E785 Hyperlipidemia, unspecified: Secondary | ICD-10-CM | POA: Diagnosis present

## 2015-04-27 DIAGNOSIS — R410 Disorientation, unspecified: Secondary | ICD-10-CM

## 2015-04-27 DIAGNOSIS — I63411 Cerebral infarction due to embolism of right middle cerebral artery: Secondary | ICD-10-CM | POA: Diagnosis not present

## 2015-04-27 DIAGNOSIS — R071 Chest pain on breathing: Secondary | ICD-10-CM

## 2015-04-27 DIAGNOSIS — R079 Chest pain, unspecified: Secondary | ICD-10-CM | POA: Diagnosis not present

## 2015-04-27 DIAGNOSIS — J45909 Unspecified asthma, uncomplicated: Secondary | ICD-10-CM

## 2015-04-27 DIAGNOSIS — I639 Cerebral infarction, unspecified: Secondary | ICD-10-CM | POA: Diagnosis not present

## 2015-04-27 DIAGNOSIS — I082 Rheumatic disorders of both aortic and tricuspid valves: Secondary | ICD-10-CM | POA: Diagnosis not present

## 2015-04-27 DIAGNOSIS — Z853 Personal history of malignant neoplasm of breast: Secondary | ICD-10-CM

## 2015-04-27 DIAGNOSIS — E0590 Thyrotoxicosis, unspecified without thyrotoxic crisis or storm: Secondary | ICD-10-CM | POA: Diagnosis not present

## 2015-04-27 DIAGNOSIS — I481 Persistent atrial fibrillation: Secondary | ICD-10-CM | POA: Diagnosis present

## 2015-04-27 DIAGNOSIS — I10 Essential (primary) hypertension: Secondary | ICD-10-CM | POA: Diagnosis present

## 2015-04-27 DIAGNOSIS — Z66 Do not resuscitate: Secondary | ICD-10-CM | POA: Diagnosis present

## 2015-04-27 DIAGNOSIS — R0600 Dyspnea, unspecified: Secondary | ICD-10-CM | POA: Diagnosis not present

## 2015-04-27 DIAGNOSIS — Z79810 Long term (current) use of selective estrogen receptor modulators (SERMs): Secondary | ICD-10-CM

## 2015-04-27 DIAGNOSIS — Z87891 Personal history of nicotine dependence: Secondary | ICD-10-CM

## 2015-04-27 LAB — BASIC METABOLIC PANEL
ANION GAP: 9 (ref 5–15)
BUN: 18 mg/dL (ref 6–20)
CALCIUM: 8.7 mg/dL — AB (ref 8.9–10.3)
CO2: 21 mmol/L — AB (ref 22–32)
CREATININE: 0.91 mg/dL (ref 0.44–1.00)
Chloride: 110 mmol/L (ref 101–111)
GFR calc Af Amer: >60 mL/min (ref >60)
GFR calc non Af Amer: >60 mL/min (ref >60)
GLUCOSE: 92 mg/dL (ref 65–99)
Potassium: 4.2 mmol/L (ref 3.5–5.1)
Sodium: 140 mmol/L (ref 135–145)

## 2015-04-27 LAB — CBC
HCT: 41.3 % (ref 36.0–46.0)
HEMOGLOBIN: 13.9 g/dL (ref 12.0–15.0)
MCH: 32 pg (ref 26.0–34.0)
MCHC: 33.7 g/dL (ref 30.0–36.0)
MCV: 94.9 fL (ref 78.0–100.0)
Platelets: 202 10*3/uL (ref 150–400)
RBC: 4.35 MIL/uL (ref 3.87–5.11)
RDW: 12.8 % (ref 11.5–15.5)
WBC: 8 10*3/uL (ref 4.0–10.5)

## 2015-04-27 LAB — TSH: TSH: 2.802 u[IU]/mL (ref 0.350–4.500)

## 2015-04-27 LAB — I-STAT TROPONIN, ED: TROPONIN I, POC: 0.01 ng/mL (ref 0.00–0.08)

## 2015-04-27 LAB — PROTIME-INR
INR: 1.04 (ref 0.00–1.49)
PROTHROMBIN TIME: 13.8 s (ref 11.6–15.2)

## 2015-04-27 LAB — D-DIMER, QUANTITATIVE (NOT AT ARMC): D DIMER QUANT: 0.44 ug{FEU}/mL (ref 0.00–0.50)

## 2015-04-27 LAB — BRAIN NATRIURETIC PEPTIDE: B Natriuretic Peptide: 404.7 pg/mL — ABNORMAL HIGH (ref 0.0–100.0)

## 2015-04-27 MED ORDER — GABAPENTIN 400 MG PO CAPS
400.0000 mg | ORAL_CAPSULE | Freq: Two times a day (BID) | ORAL | Status: DC
  Administered 2015-04-27 – 2015-05-02 (×11): 400 mg via ORAL
  Filled 2015-04-27 (×11): qty 1

## 2015-04-27 MED ORDER — MOMETASONE FURO-FORMOTEROL FUM 100-5 MCG/ACT IN AERO
2.0000 | INHALATION_SPRAY | Freq: Two times a day (BID) | RESPIRATORY_TRACT | Status: DC
  Administered 2015-04-27 – 2015-05-02 (×8): 2 via RESPIRATORY_TRACT
  Filled 2015-04-27: qty 8.8

## 2015-04-27 MED ORDER — NITROGLYCERIN 0.4 MG SL SUBL
0.4000 mg | SUBLINGUAL_TABLET | SUBLINGUAL | Status: AC | PRN
  Administered 2015-04-27 (×3): 0.4 mg via SUBLINGUAL
  Filled 2015-04-27: qty 1

## 2015-04-27 MED ORDER — SIMVASTATIN 40 MG PO TABS
40.0000 mg | ORAL_TABLET | Freq: Every day | ORAL | Status: DC
  Administered 2015-04-27 – 2015-04-28 (×2): 40 mg via ORAL
  Filled 2015-04-27 (×2): qty 1

## 2015-04-27 MED ORDER — FUROSEMIDE 10 MG/ML IJ SOLN
40.0000 mg | Freq: Once | INTRAMUSCULAR | Status: AC
  Administered 2015-04-27: 40 mg via INTRAVENOUS
  Filled 2015-04-27: qty 4

## 2015-04-27 MED ORDER — TAMOXIFEN CITRATE 10 MG PO TABS
20.0000 mg | ORAL_TABLET | Freq: Two times a day (BID) | ORAL | Status: DC
  Administered 2015-04-27 – 2015-04-29 (×5): 20 mg via ORAL
  Filled 2015-04-27 (×7): qty 2

## 2015-04-27 MED ORDER — IRBESARTAN 75 MG PO TABS: 37.5000 mg | ORAL_TABLET | Freq: Every day | ORAL | Status: DC

## 2015-04-27 MED ORDER — ASPIRIN 81 MG PO CHEW: 324.0000 mg | CHEWABLE_TABLET | Freq: Once | ORAL | Status: DC

## 2015-04-27 MED ORDER — PREDNISONE 20 MG PO TABS
40.0000 mg | ORAL_TABLET | Freq: Every day | ORAL | Status: DC
  Administered 2015-04-27 – 2015-04-28 (×2): 40 mg via ORAL
  Filled 2015-04-27 (×2): qty 2

## 2015-04-27 MED ORDER — TAMOXIFEN CITRATE 20 MG PO TABS: 20.0000 mg | ORAL_TABLET | Freq: Every day | ORAL | Status: DC

## 2015-04-27 MED ORDER — ONDANSETRON HCL 4 MG/2ML IJ SOLN: 4.0000 mg | Freq: Four times a day (QID) | INTRAMUSCULAR | Status: DC | PRN

## 2015-04-27 MED ORDER — LEVALBUTEROL HCL 1.25 MG/0.5ML IN NEBU
1.2500 mg | INHALATION_SOLUTION | Freq: Four times a day (QID) | RESPIRATORY_TRACT | Status: DC | PRN
  Filled 2015-04-27: qty 0.5

## 2015-04-27 MED ORDER — GUAIFENESIN ER 600 MG PO TB12
600.0000 mg | ORAL_TABLET | Freq: Two times a day (BID) | ORAL | Status: DC
  Administered 2015-04-27 – 2015-05-02 (×10): 600 mg via ORAL
  Filled 2015-04-27 (×11): qty 1

## 2015-04-27 MED ORDER — SODIUM CHLORIDE 0.9% FLUSH
3.0000 mL | Freq: Two times a day (BID) | INTRAVENOUS | Status: DC
  Administered 2015-04-27: 3 mL via INTRAVENOUS

## 2015-04-27 MED ORDER — SODIUM CHLORIDE 0.9 % IV SOLN: 250.0000 mL | INTRAVENOUS | Status: DC | PRN

## 2015-04-27 MED ORDER — SODIUM CHLORIDE 0.9% FLUSH: 3.0000 mL | INTRAVENOUS | Status: DC | PRN

## 2015-04-27 MED ORDER — ENOXAPARIN SODIUM 40 MG/0.4ML ~~LOC~~ SOLN
40.0000 mg | SUBCUTANEOUS | Status: DC
  Administered 2015-04-27: 40 mg via SUBCUTANEOUS
  Filled 2015-04-27 (×2): qty 0.4

## 2015-04-27 MED ORDER — THYROID 30 MG PO TABS
30.0000 mg | ORAL_TABLET | Freq: Every day | ORAL | Status: DC
  Administered 2015-04-28 – 2015-05-02 (×5): 30 mg via ORAL
  Filled 2015-04-27 (×6): qty 1

## 2015-04-27 MED ORDER — ASPIRIN EC 81 MG PO TBEC
81.0000 mg | DELAYED_RELEASE_TABLET | Freq: Every day | ORAL | Status: DC
Start: 2015-04-27 — End: 2015-04-28
  Administered 2015-04-28: 81 mg via ORAL
  Filled 2015-04-27: qty 1

## 2015-04-27 MED ORDER — ACETAMINOPHEN 325 MG PO TABS
650.0000 mg | ORAL_TABLET | ORAL | Status: DC | PRN
  Administered 2015-04-27: 650 mg via ORAL
  Filled 2015-04-27: qty 2

## 2015-04-27 MED ORDER — GUAIFENESIN-DM 100-10 MG/5ML PO SYRP
5.0000 mL | ORAL_SOLUTION | ORAL | Status: DC | PRN
  Administered 2015-05-01: 5 mL via ORAL
  Filled 2015-04-27: qty 5

## 2015-04-27 MED ORDER — PANTOPRAZOLE SODIUM 40 MG PO TBEC
40.0000 mg | DELAYED_RELEASE_TABLET | Freq: Every day | ORAL | Status: DC
  Administered 2015-04-27 – 2015-05-02 (×6): 40 mg via ORAL
  Filled 2015-04-27 (×6): qty 1

## 2015-04-27 MED ORDER — LATANOPROST 0.005 % OP SOLN
1.0000 [drp] | Freq: Every day | OPHTHALMIC | Status: DC
  Administered 2015-04-27 – 2015-05-01 (×5): 1 [drp] via OPHTHALMIC
  Filled 2015-04-27: qty 2.5

## 2015-04-27 NOTE — ED Notes (Signed)
Admitting at bedside 

## 2015-04-27 NOTE — H&P (Signed)
[facility] History and Physical  Patricia [name] UUV:[id number] DOB: 26-Nov-1940 DOA: 04/27/2015  Referring physician: Regenia Skeeter, MD PCP: Annye Asa, MD   Chief Complaint: Chest pain and Shortness of Breath  HPI: Patricia [name] is a 75 y.o. female with a history of lung cancer s/p resection 2003,  and ER+ breast Ca, s/p  lumpectomy on observation after Tamoxifen and radiation, last in 11/2014, HTN, hyperlipidemia, hypothyroidism, presenting with 1 day history of substernal chest pain described as 6/10, sharp, constant since yesterday and stabbing in nature without radiation. She did not seek medical help at the time. Pain not  worsened with deep inspiration, movement or exertion. Patient took an unknown amount of her home nitroglycerin without improvement as well as a baby aspirin. She reports not feeling well for the last 2 days. She denies any jaw pain. Denies any dizziness or falls. Reports some acute on chronic dyspnea, worse on exertion. She has chronic cough, not worse since symptoms began.  Mild sorethroat reported. She denies any fever or chills. She denies any nausea or vomiting or abdominal pain. Appetite is normal and she eats salt rich foods. Denies any leg swelling or calf pain.. Denies any headaches or vision changes. She denies any seizures. She denies any confusion. She denies any prior history of cardiac disease.The patient never had an echocardiogram or seen by Cardiology. She has just returned form a long distance trip to [place] to a [place] to [place], but denies any sick contacts in the process.   At the ED,  She received ASA and Nitroglycerin, her EKG shows NSR with non specific ST and TW abnormality, new since last EKG 2016. QTC 410. Her CMET and CBC are essentially normal. Her troponins are negative to date, 0.01. 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   HENT: Positive for sore throat.   Eyes:       She has glaucoma  Respiratory: Positive for cough, sputum production and shortness of breath. Negative for hemoptysis and wheezing.   Cardiovascular: Positive for chest pain and orthopnea. Negative for palpitations, claudication, leg swelling and PND.  Gastrointestinal: Negative.   Genitourinary: Negative.   Musculoskeletal: Negative.   Skin: Negative.   Neurological: Negative.   Endo/Heme/Allergies: Negative.   Psychiatric/Behavioral: Negative.     Past Medical History  Diagnosis Date  . Cough variant asthma   . Osteopenia   . Stricture and stenosis of cervix   . Postmenopausal   . GERD (gastroesophageal reflux disease)   . Disease of pharynx or nasopharynx   . Elevated BP   . Neoplasm   . Personal history of malignant neoplasm of bronchus and lung   . Allergic rhinitis, cause unspecified   . Diverticulosis   . Internal hemorrhoids   . Hiatal hernia   . Hypertension   . Laryngospasm   . 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 Hyperlipidemia   . Glaucoma   . Laryngospasm   . Multinodular goiter   . Chronic cough   . 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 Radiation 10/22/14-11/23/14    Left Breast   Past Surgical History  Procedure Laterality Date  . Lung cancer surgery  2003    resection carcinoid lingula-lt upper lobe  . Colonoscopy    . Radioactive seed guided mastectomy with  axillary sentinel lymph node biopsy Left 06/26/2014    Procedure: RADIOACTIVE SEED GUIDED PARTIAL MASTECTOMY WITH AXILLARY SENTINEL LYMPH NODE BIOPSY;  Surgeon: Stark Klein, MD;  Location: [place];  Service: General;  Laterality: Left;  . Breast surgery    . Re-excision of breast lumpectomy Left 08/07/2014    Procedure: RE-EXCISION OF LEFT BREAST LUMPECTOMY;  Surgeon: Stark Klein, MD;  Location: [place];  Service: General;  Laterality:  Left;   Social History:  reports that she quit smoking about 51 years ago. She does not have any smokeless tobacco history on file. She reports that she drinks alcohol. She reports that she does not use illicit drugs.  Allergies  Allergen Reactions  . Combigan [Brimonidine Tartrate-Timolol]     Eyes itch, reddened  . Sulfa Antibiotics Rash    Family History  Problem Relation Age of Onset  . Stroke Mother   . Hypertension Mother   . Hyperlipidemia Mother   . Stroke Father   . Transient ischemic attack Father   . Hyperlipidemia Father   . Hypertension Father      Prior to Admission medications   Medication Sig Start Date End Date Taking? Authorizing Provider  albuterol (VENTOLIN HFA) 108 (90 BASE) MCG/ACT inhaler Inhale 2 puffs into the lungs every 4 (four) hours as needed. Patient taking differently: Inhale 2 puffs into the lungs as needed for wheezing or shortness of breath.  02/25/11  Yes Midge Minium, MD  ARMOUR THYROID 30 MG tablet Take 1 tablet (30 mg total) by mouth daily before breakfast. 02/14/15  Yes Midge Minium, MD  aspirin EC 81 MG tablet Take 81 mg by mouth daily.   Yes Historical Provider, MD  bimatoprost (LUMIGAN) 0.03 % ophthalmic drops Place 1 drop into both eyes at bedtime.    Yes Historical Provider, MD  dorzolamide (TRUSOPT) 2 % ophthalmic solution Place 1 drop into both eyes 2 (two) times daily.   Yes Historical Provider, MD  fluticasone-salmeterol (ADVAIR HFA) 230-21 MCG/ACT inhaler INHALE 2 PUFFS INTO THE LUNGS TWICE A DAY 08/10/14  Yes Midge Minium, MD  gabapentin (NEURONTIN) 400 MG capsule Take 1 capsule (400 mg total) by mouth 2 (two) times daily. 08/10/14  Yes Midge Minium, MD  loratadine (CLARITIN) 10 MG tablet Take 1 tablet (10 mg total) by mouth daily. Patient taking differently: Take 10 mg by mouth daily as needed for allergies.  12/18/11  Yes John-Adam Bonk, MD  Multiple Vitamins-Iron (MULTIVITAMIN/IRON) TABS Take 1 tablet by  mouth daily.    Yes Historical Provider, MD  omeprazole (PRILOSEC) 20 MG capsule TAKE 1 CAPSULE DAILY 30 MINUTES BEFORE A MEAL 12/11/14  Yes Midge Minium, MD  simvastatin (ZOCOR) 40 MG tablet Take 1 tablet (40 mg total) by mouth at bedtime. 08/10/14  Yes Midge Minium, MD  tamoxifen (NOLVADEX) 20 MG tablet Take 1 tablet (20 mg total) by mouth daily. 01/01/15  Yes Chauncey Cruel, MD  traMADol (ULTRAM) 50 MG tablet Take 1 tablet (50 mg total) by mouth every 6 (six) hours as needed. for pain 04/11/15  Yes Midge Minium, MD  valsartan (DIOVAN) 40 MG tablet TAKE 1 TABLET DAILY 04/03/15  Yes Midge Minium, MD   Physical Exam: Filed Vitals:   04/27/15 1216 04/27/15 1300 04/27/15 1400 04/27/15 1604  BP: 114/67 120/74 122/76   Pulse:  73 75   Temp: 98.6 F (37 C)     TempSrc: Oral  Resp: '18 20 24   '$ Height:    '4\' 11"'$  (1.499 m)  Weight:    68.266 kg (150 lb 8 oz)  SpO2: 94% 98% 96%     Wt Readings from Last 3 Encounters:  04/27/15 68.266 kg (150 lb 8 oz)  02/14/15 65.998 kg (145 lb 8 oz)  12/20/14 65.908 kg (145 lb 4.8 oz)    Physical Exam  Constitutional: She is oriented to person, place, and time and well-developed, well-nourished, and in no distress.  HENT:  Head: Normocephalic and atraumatic.  Mouth/Throat: No oropharyngeal exudate.  Eyes: EOM are normal. Pupils are equal, round, and reactive to light. No scleral icterus.  Neck: Normal range of motion. Neck supple. No JVD present.  Cardiovascular: Normal rate and regular rhythm.  Exam reveals no gallop and no friction rub.   No murmur heard. Pulmonary/Chest: Effort normal. No respiratory distress. She has wheezes. She has no rales. She exhibits no tenderness.  Rhonchi and decreased breath sounds at the bases  Abdominal: Soft. Bowel sounds are normal. She exhibits no distension. There is no tenderness.  Musculoskeletal: Normal range of motion. She exhibits no edema or tenderness.  Lymphadenopathy:    She has no  cervical adenopathy.  Neurological: She is alert and oriented to person, place, and time. She exhibits normal muscle tone.  Skin: Skin is warm and dry. No rash noted. No erythema. No pallor.  Psychiatric: Mood, memory, affect and judgment normal.            Labs on Admission:  Basic Metabolic Panel:  Recent Labs Lab 04/27/15 1140  NA 140  K 4.2  CL 110  CO2 21*  GLUCOSE 92  BUN 18  CREATININE 0.91  CALCIUM 8.7*    Liver Function Tests: No results for input(s): AST, ALT, ALKPHOS, BILITOT, PROT, ALBUMIN in the last 168 hours. No results for input(s): LIPASE, AMYLASE in the last 168 hours. No results for input(s): AMMONIA in the last 168 hours.  CBC:  Recent Labs Lab 04/27/15 1140  WBC 8.0  HGB 13.9  HCT 41.3  MCV 94.9  PLT 202    Cardiac Enzymes: No results for input(s): CKTOTAL, CKMB, CKMBINDEX, TROPONINI in the last 168 hours.  BNP (last 3 results)  Recent Labs  04/27/15 1140  BNP 404.7*    ProBNP (last 3 results) No results for input(s): PROBNP in the last 8760 hours.   CREATININE: 0.91 (04/27/15 1140) Estimated creatinine clearance - 45.6 mL/min  CBG: No results for input(s): GLUCAP in the last 168 hours.  Radiological Exams on Admission: Dg Chest 2 View  04/27/2015  CLINICAL DATA:  75 year old current history of asthma presenting with acute onset of shortness of breath and chest heaviness which began yesterday. Personal history of left upper lobe lung cancer post resection in 2007 and personal history of left breast cancer post lumpectomy in 2016. EXAM: CHEST  2 VIEW COMPARISON:  12/18/2011 and earlier. FINDINGS: Cardiac silhouette upper normal in size to slightly enlarged, unchanged. Thoracic aorta mildly atherosclerotic and tortuous, unchanged. Hilar and mediastinal contours otherwise unremarkable. Interstitial opacities throughout both lungs associated with small bilateral pleural effusions. Mild atelectasis in the lower lobes. Postsurgical  changes in the lingula and left hilum. Surgical clips in the left breast and left axilla prior lumpectomy and node dissection. Degenerative changes throughout the thoracic and lumbar spine with thoracolumbar scoliosis convex left. IMPRESSION: Mild diffuse interstitial pulmonary edema and small bilateral pleural effusions likely indicating mild CHF. However, lymphangitic carcinomatosis can have a  similar appearance. Electronically Signed   By: Evangeline Dakin M.D.   On: 04/27/2015 11:15    EKG: NSR with non specific ST and TW abnormality, new since last EKG 2016. QTC 410     Assessment/Plan Principal Problem:   Chest pain Active Problems:   Pulmonary edema   HYPERTENSION, BENIGN ESSENTIAL   GERD   CHEST PAIN   Hyperthyroidism   Asthma   Chest pain syndrome  HEART score 4-5. Troponin negative to date, EKG NSR with non specific ST and TW abnormality, new since last EKG 2016. QTC 410  No prior History of CHF, or CAD or MI.  CP relieved by nitroglycerin, aspirin. CXR mild diffuse interstitial pulmonary edema and small bilateral pleural effusions likely secondary to mild CHF   - Admit to Telemetry/ Observation Cycle troponins 2 D echo EKG in am continue  ASA, O2 and NTG as needed Continue Statins  Hold Avapro due to risk of renal failure GI cocktail Lasix 40 mg IV x1, no further diuretics till echo results  D Dimer due to pt history of malignancy , Tamoxifen and recent long distance trip. If positive will proceed with CT angio chest.  Asthma. SOB present. Question acute exacerbation vs. Viral illness vs. CHF exacerbation with a component of laryngitis Dulera inhaler Xopenex nebulizer Prednisone 40 mg po daily Robitussin DM for cough  Mucinex  Hypothyroidism: Chronic. Last TSH was on 5.63 02/14/15 -Continue home Armour  Hypertension, controled BP 122/76 mmHg  Hold Avapro due to risk of renal failure May add hydralazine forSBP >254 and 270 diastolic  Hyperlipidemia HDL 70, LDL  105, TG 88, total cholesterol 192, VLDL 17.6, Continue home statins with Zocor  GERD  continue PPI  History of Breast Cancer,  Clinical St 2 A , s/p lumpectomy L, on Tamoxifen Continue Tamoxifen  History of lung cancer on observation cxr admission  mild diffuse interstitial pulmonary edema and small bilateral pleural effusions likely secondary to mild CHF versus lymphangitic carcinomatosis      Code Status: DNR DVT Prophylaxis: Lovenox Family Communication: husband at bedside Disposition Plan: Pending Improvement. Admitted for observation in tele bed. Expected LOS 24-48 hrs  [facility] E,PA-C [facility] [email or web address] Password [email or web address]

## 2015-04-27 NOTE — ED Provider Notes (Signed)
CSN: [id number]     Arrival date & time 04/27/15  1036 History   First MD Initiated Contact with Patient 04/27/15 1136     Chief Complaint  Patient presents with  . Shortness of Breath  . Chest Pain     (Consider location/radiation/quality/duration/timing/severity/associated sxs/prior Treatment) HPI  75 year old female presents with chest heaviness and shorts of breath since yesterday at around 1 PM. Pain has been pretty constant since starting. Does not radiate anywhere. No back pain. Has not been feeling well for the couple days prior to this but no fevers. Chronic cough but no change in her cough. Prior history of lung cancer and a breast lump but has had no treatment for either of these in over one year. No leg swelling. Shortness of breath is much worse with minimal exertion. No history of CAD or CHF. Currently rates her heaviness had a 6/10.  Past Medical History  Diagnosis Date  . Cough variant asthma   . Osteopenia   . Stricture and stenosis of cervix   . Postmenopausal   . GERD (gastroesophageal reflux disease)   . Disease of pharynx or nasopharynx   . Elevated BP   . Neoplasm   . Personal history of malignant neoplasm of bronchus and lung   . Allergic rhinitis, cause unspecified   . Diverticulosis   . Internal hemorrhoids   . Hiatal hernia   . Hypertension   . Laryngospasm   . 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 Hyperlipidemia   . Glaucoma   . Laryngospasm   . Multinodular goiter   . Chronic cough   . 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 Radiation 10/22/14-11/23/14    Left Breast   Past Surgical History  Procedure Laterality Date  . Lung cancer surgery  2003    resection carcinoid lingula-lt upper lobe  . Colonoscopy    . Radioactive seed guided mastectomy with axillary sentinel lymph node biopsy Left 06/26/2014    Procedure: RADIOACTIVE SEED GUIDED PARTIAL MASTECTOMY WITH AXILLARY SENTINEL LYMPH NODE BIOPSY;  Surgeon: Stark Klein, MD;   Location: [place];  Service: General;  Laterality: Left;  . Breast surgery    . Re-excision of breast lumpectomy Left 08/07/2014    Procedure: RE-EXCISION OF LEFT BREAST LUMPECTOMY;  Surgeon: Stark Klein, MD;  Location: [place];  Service: General;  Laterality: Left;   Family History  Problem Relation Age of Onset  . Stroke Mother   . Hypertension Mother   . Hyperlipidemia Mother   . Stroke Father   . Transient ischemic attack Father   . Hyperlipidemia Father   . Hypertension Father    Social History  Substance Use Topics  . Smoking status: Former Smoker    Quit date: 06/15/1963  . Smokeless tobacco: None  . Alcohol Use: Yes     Comment: occasional wine   OB History    No data available     Review of Systems  Constitutional: Negative for fever.  Respiratory: Positive for cough and shortness of breath.   Cardiovascular: Positive for chest pain. Negative for leg swelling.  All other systems reviewed and are negative.     Allergies  Combigan and Sulfa antibiotics  Home Medications   Prior to Admission medications   Medication Sig Start Date End Date Taking? Authorizing Provider  albuterol (VENTOLIN HFA) 108 (90 BASE) MCG/ACT inhaler Inhale 2 puffs into the lungs every 4 (  four) hours as needed. Patient taking differently: Inhale 2 puffs into the lungs as needed for wheezing or shortness of breath.  02/25/11  Yes Midge Minium, MD  ARMOUR THYROID 30 MG tablet Take 1 tablet (30 mg total) by mouth daily before breakfast. 02/14/15  Yes Midge Minium, MD  aspirin EC 81 MG tablet Take 81 mg by mouth daily.   Yes Historical Provider, MD  bimatoprost (LUMIGAN) 0.03 % ophthalmic drops Place 1 drop into both eyes at bedtime.    Yes Historical Provider, MD  dorzolamide (TRUSOPT) 2 % ophthalmic solution Place 1 drop into both eyes 2 (two) times daily.   Yes Historical Provider, MD  fluticasone-salmeterol (ADVAIR HFA) 230-21 MCG/ACT inhaler  INHALE 2 PUFFS INTO THE LUNGS TWICE A DAY 08/10/14  Yes Midge Minium, MD  gabapentin (NEURONTIN) 400 MG capsule Take 1 capsule (400 mg total) by mouth 2 (two) times daily. 08/10/14  Yes Midge Minium, MD  loratadine (CLARITIN) 10 MG tablet Take 1 tablet (10 mg total) by mouth daily. Patient taking differently: Take 10 mg by mouth daily as needed for allergies.  12/18/11  Yes John-Adam Bonk, MD  Multiple Vitamins-Iron (MULTIVITAMIN/IRON) TABS Take 1 tablet by mouth daily.    Yes Historical Provider, MD  omeprazole (PRILOSEC) 20 MG capsule TAKE 1 CAPSULE DAILY 30 MINUTES BEFORE A MEAL 12/11/14  Yes Midge Minium, MD  simvastatin (ZOCOR) 40 MG tablet Take 1 tablet (40 mg total) by mouth at bedtime. 08/10/14  Yes Midge Minium, MD  tamoxifen (NOLVADEX) 20 MG tablet Take 1 tablet (20 mg total) by mouth daily. 01/01/15  Yes Chauncey Cruel, MD  traMADol (ULTRAM) 50 MG tablet Take 1 tablet (50 mg total) by mouth every 6 (six) hours as needed. for pain 04/11/15  Yes Midge Minium, MD  valsartan (DIOVAN) 40 MG tablet TAKE 1 TABLET DAILY 04/03/15  Yes Midge Minium, MD   BP 153/94 mmHg  Pulse 79  Temp(Src) 98 F (36.7 C) (Oral)  Resp 18  Ht '4\' 11"'$  (1.499 m)  Wt 143 lb (64.864 kg)  BMI 28.87 kg/m2  SpO2 92% Physical Exam  Constitutional: She is oriented to person, place, and time. She appears well-developed and well-nourished. No distress.  HENT:  Head: Normocephalic and atraumatic.  Right Ear: External ear normal.  Left Ear: External ear normal.  Nose: Nose normal.  Eyes: Right eye exhibits no discharge. Left eye exhibits no discharge.  Cardiovascular: Normal rate, regular rhythm and normal heart sounds.   Pulmonary/Chest: Effort normal and breath sounds normal.  Abdominal: Soft. She exhibits no distension. There is no tenderness.  Musculoskeletal: She exhibits no edema.  Neurological: She is alert and oriented to person, place, and time.  Skin: Skin is warm and dry.  She is not diaphoretic.  Nursing note and vitals reviewed.   ED Course  Procedures (including critical care time) Labs Review Labs Reviewed  BASIC METABOLIC PANEL - Abnormal; Notable for the following:    CO2 21 (*)    Calcium 8.7 (*)    All other components within normal limits  BRAIN NATRIURETIC PEPTIDE - Abnormal; Notable for the following:    B Natriuretic Peptide 404.7 (*)    All other components within normal limits  CBC  PROTIME-INR  TSH  D-DIMER, QUANTITATIVE (NOT AT [facility])  BASIC METABOLIC PANEL  Randolm Idol, ED    Imaging Review Dg Chest 2 View  04/27/2015  CLINICAL DATA:  75 year old current history of asthma  presenting with acute onset of shortness of breath and chest heaviness which began yesterday. Personal history of left upper lobe lung cancer post resection in 2007 and personal history of left breast cancer post lumpectomy in 2016. EXAM: CHEST  2 VIEW COMPARISON:  12/18/2011 and earlier. FINDINGS: Cardiac silhouette upper normal in size to slightly enlarged, unchanged. Thoracic aorta mildly atherosclerotic and tortuous, unchanged. Hilar and mediastinal contours otherwise unremarkable. Interstitial opacities throughout both lungs associated with small bilateral pleural effusions. Mild atelectasis in the lower lobes. Postsurgical changes in the lingula and left hilum. Surgical clips in the left breast and left axilla prior lumpectomy and node dissection. Degenerative changes throughout the thoracic and lumbar spine with thoracolumbar scoliosis convex left. IMPRESSION: Mild diffuse interstitial pulmonary edema and small bilateral pleural effusions likely indicating mild CHF. However, lymphangitic carcinomatosis can have a similar appearance. Electronically Signed   By: Evangeline Dakin M.D.   On: 04/27/2015 11:15   I have personally reviewed and evaluated these images and lab results as part of my medical decision-making.   EKG Interpretation   Date/Time:  Saturday  April 27 2015 10:45:33 EST Ventricular Rate:  75 PR Interval:  146 QRS Duration: 76 QT Interval:  368 QTC Calculation: 410 R Axis:   88 Text Interpretation:  Normal sinus rhythm Low voltage QRS Nonspecific ST  and T wave abnormality Abnormal ECG changes noted since June 2016  Confirmed by Regenia Skeeter  MD, Sarrinah Gardin ([id number]) on 04/27/2015 10:52:50 AM      MDM   Final diagnoses:  Shortness of breath    Chest heaviness resolved with nitro, and her BP improved. Appears to have mild CHF which is new. EKG with nonspecific changes but no ST depression/elevation. New compared to most recent. Troponin normal. Given new findings with chest pain, will admit to hospitalist for ACS/CHF workup.    Sherwood Gambler, MD 04/27/15 [id number]

## 2015-04-27 NOTE — ED Notes (Signed)
Pt received '325mg'$  of aspirin at [facility]. D/c order for aspirin in ED.

## 2015-04-27 NOTE — ED Notes (Signed)
Pt sent from urgent care for further eval of shortness of breath and heaviness in chest onset yesterday at 5pm.

## 2015-04-28 ENCOUNTER — Observation Stay (HOSPITAL_COMMUNITY): Payer: Medicare Other

## 2015-04-28 ENCOUNTER — Observation Stay (HOSPITAL_BASED_OUTPATIENT_CLINIC_OR_DEPARTMENT_OTHER): Payer: Medicare Other

## 2015-04-28 DIAGNOSIS — Z5181 Encounter for therapeutic drug level monitoring: Secondary | ICD-10-CM

## 2015-04-28 DIAGNOSIS — R9431 Abnormal electrocardiogram [ECG] [EKG]: Secondary | ICD-10-CM

## 2015-04-28 DIAGNOSIS — R079 Chest pain, unspecified: Secondary | ICD-10-CM | POA: Diagnosis not present

## 2015-04-28 DIAGNOSIS — R0602 Shortness of breath: Secondary | ICD-10-CM | POA: Diagnosis not present

## 2015-04-28 DIAGNOSIS — I480 Paroxysmal atrial fibrillation: Secondary | ICD-10-CM | POA: Diagnosis not present

## 2015-04-28 DIAGNOSIS — I10 Essential (primary) hypertension: Secondary | ICD-10-CM

## 2015-04-28 DIAGNOSIS — I639 Cerebral infarction, unspecified: Secondary | ICD-10-CM | POA: Insufficient documentation

## 2015-04-28 DIAGNOSIS — J810 Acute pulmonary edema: Secondary | ICD-10-CM

## 2015-04-28 DIAGNOSIS — J4520 Mild intermittent asthma, uncomplicated: Secondary | ICD-10-CM | POA: Diagnosis not present

## 2015-04-28 DIAGNOSIS — I63411 Cerebral infarction due to embolism of right middle cerebral artery: Secondary | ICD-10-CM | POA: Diagnosis not present

## 2015-04-28 DIAGNOSIS — Z7901 Long term (current) use of anticoagulants: Secondary | ICD-10-CM

## 2015-04-28 DIAGNOSIS — I4891 Unspecified atrial fibrillation: Secondary | ICD-10-CM

## 2015-04-28 DIAGNOSIS — M7989 Other specified soft tissue disorders: Secondary | ICD-10-CM | POA: Diagnosis not present

## 2015-04-28 LAB — URINALYSIS, ROUTINE W REFLEX MICROSCOPIC
BILIRUBIN URINE: NEGATIVE
GLUCOSE, UA: NEGATIVE mg/dL
KETONES UR: NEGATIVE mg/dL
LEUKOCYTES UA: NEGATIVE
Nitrite: NEGATIVE
PH: 6.5 (ref 5.0–8.0)
Protein, ur: NEGATIVE mg/dL
Specific Gravity, Urine: 1.01 (ref 1.005–1.030)

## 2015-04-28 LAB — URINE MICROSCOPIC-ADD ON

## 2015-04-28 LAB — BASIC METABOLIC PANEL
ANION GAP: 11 (ref 5–15)
BUN: 22 mg/dL — ABNORMAL HIGH (ref 6–20)
CALCIUM: 9.2 mg/dL (ref 8.9–10.3)
CO2: 25 mmol/L (ref 22–32)
Chloride: 107 mmol/L (ref 101–111)
Creatinine, Ser: 1.17 mg/dL — ABNORMAL HIGH (ref 0.44–1.00)
GFR, EST AFRICAN AMERICAN: 52 mL/min — AB (ref >60)
GFR, EST NON AFRICAN AMERICAN: 45 mL/min — AB (ref >60)
Glucose, Bld: 157 mg/dL — ABNORMAL HIGH (ref 65–99)
POTASSIUM: 4.2 mmol/L (ref 3.5–5.1)
SODIUM: 143 mmol/L (ref 135–145)

## 2015-04-28 LAB — TROPONIN I

## 2015-04-28 LAB — MAGNESIUM: MAGNESIUM: 2.1 mg/dL (ref 1.7–2.4)

## 2015-04-28 LAB — HEPARIN LEVEL (UNFRACTIONATED): Heparin Unfractionated: 0.22 IU/mL — ABNORMAL LOW (ref 0.30–0.70)

## 2015-04-28 MED ORDER — METOPROLOL TARTRATE 12.5 MG HALF TABLET
12.5000 mg | ORAL_TABLET | Freq: Two times a day (BID) | ORAL | Status: DC
Start: 2015-04-28 — End: 2015-04-29
  Administered 2015-04-28 (×2): 12.5 mg via ORAL
  Filled 2015-04-28 (×2): qty 1

## 2015-04-28 MED ORDER — HEPARIN (PORCINE) IN NACL 100-0.45 UNIT/ML-% IJ SOLN
750.0000 [IU]/h | INTRAMUSCULAR | Status: DC
  Administered 2015-04-28: 750 [IU]/h via INTRAVENOUS
  Administered 2015-04-29: 850 [IU]/h via INTRAVENOUS
  Filled 2015-04-28: qty 250

## 2015-04-28 MED ORDER — STROKE: EARLY STAGES OF RECOVERY BOOK
Freq: Once | Status: AC
  Administered 2015-04-28: 1
  Filled 2015-04-28: qty 1

## 2015-04-28 NOTE — Consult Note (Signed)
Cardiologist:  New Reason for Consult: Chest Pain Referring Physician:   Charniece Venturino is an 75 y.o. female.  HPI:   The patient is a 75 yo female with a history of lung cancer with resection in 2003, breast cancer- radiation therapy last year, GERD, HLD.  She presents with  Chest pain.  She reports right-sided chest pressure since Tuesday of last week.   It did ease off a little bit at times and was associated with a little shortness of breath. She plays golf and feels she's been more short of breath with some chest pressure when walking up hills.  She's had more lower extremity edema when usual and is sleeping on an extra pillow because it makes her breathing easier.  She also feels generally fatigued.  Quit smoking over 40 years ago. She says she was given a sublingual nitroglycerin in the emergency room with which relieved the pain. She also reports reports being confused since Saturday and having difficulty getting her words out.  The patient currently denies nausea, vomiting, fever, chills, palpitations,  dizziness, PND, cough, congestion, abdominal pain, hematochezia, melena, claudication.   Past Medical History  Diagnosis Date  . Cough variant asthma   . Osteopenia   . Stricture and stenosis of cervix   . Postmenopausal   . GERD (gastroesophageal reflux disease)   . Disease of pharynx or nasopharynx   . Elevated BP   . Neoplasm   . Personal history of malignant neoplasm of bronchus and lung   . Allergic rhinitis, cause unspecified   . Diverticulosis   . Internal hemorrhoids   . Hiatal hernia   . Hypertension   . Laryngospasm   . 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 Hyperlipidemia   . Glaucoma   . Laryngospasm   . Multinodular goiter   . Chronic cough   . 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 Radiation 10/22/14-11/23/14    Left Breast    Past Surgical History  Procedure Laterality Date  . Lung cancer surgery  2003    resection carcinoid lingula-lt  upper lobe  . Colonoscopy    . Radioactive seed guided mastectomy with axillary sentinel lymph node biopsy Left 06/26/2014    Procedure: RADIOACTIVE SEED GUIDED PARTIAL MASTECTOMY WITH AXILLARY SENTINEL LYMPH NODE BIOPSY;  Surgeon: Stark Klein, MD;  Location: [place];  Service: General;  Laterality: Left;  . Breast surgery    . Re-excision of breast lumpectomy Left 08/07/2014    Procedure: RE-EXCISION OF LEFT BREAST LUMPECTOMY;  Surgeon: Stark Klein, MD;  Location: [place];  Service: General;  Laterality: Left;    Family History  Problem Relation Age of Onset  . Stroke Mother   . Hypertension Mother   . Hyperlipidemia Mother   . Stroke Father   . Transient ischemic attack Father   . Hyperlipidemia Father   . Hypertension Father     Social History:  reports that she quit smoking about 51 years ago. She does not have any smokeless tobacco history on file. She reports that she drinks alcohol. She reports that she does not use illicit drugs.  Allergies:  Allergies  Allergen Reactions  . Combigan [Brimonidine Tartrate-Timolol]     Eyes itch, reddened  . Sulfa Antibiotics Rash    Medications:  Scheduled Meds: . aspirin EC  81 mg Oral Daily  . enoxaparin (LOVENOX) injection  40 mg Subcutaneous Q24H  .  gabapentin  400 mg Oral BID  . guaiFENesin  600 mg Oral BID  . latanoprost  1 drop Both Eyes QHS  . mometasone-formoterol  2 puff Inhalation BID  . pantoprazole  40 mg Oral Daily  . simvastatin  40 mg Oral QHS  . sodium chloride flush  3 mL Intravenous Q12H  . tamoxifen  20 mg Oral BID  . thyroid  30 mg Oral QAC breakfast   Continuous Infusions:  PRN Meds:.sodium chloride, acetaminophen, guaiFENesin-dextromethorphan, levalbuterol, ondansetron (ZOFRAN) IV, sodium chloride flush   Results for orders placed or performed during the hospital encounter of 04/27/15 (from the past 48 hour(s))  Basic metabolic panel     Status: Abnormal   Collection  Time: 04/27/15 11:40 AM  Result Value Ref Range   Sodium 140 135 - 145 mmol/L   Potassium 4.2 3.5 - 5.1 mmol/L   Chloride 110 101 - 111 mmol/L   CO2 21 (L) 22 - 32 mmol/L   Glucose, Bld 92 65 - 99 mg/dL   BUN 18 6 - 20 mg/dL   Creatinine, Ser 0.91 0.44 - 1.00 mg/dL   Calcium 8.7 (L) 8.9 - 10.3 mg/dL   GFR calc non Af Amer >60 >60 mL/min   GFR calc Af Amer >60 >60 mL/min    Comment: (NOTE) The eGFR has been calculated using the CKD EPI equation. This calculation has not been validated in all clinical situations. eGFR's persistently <60 mL/min signify possible Chronic Kidney Disease.    Anion gap 9 5 - 15  CBC     Status: None   Collection Time: 04/27/15 11:40 AM  Result Value Ref Range   WBC 8.0 4.0 - 10.5 K/uL   RBC 4.35 3.87 - 5.11 MIL/uL   Hemoglobin 13.9 12.0 - 15.0 g/dL   HCT 41.3 36.0 - 46.0 %   MCV 94.9 78.0 - 100.0 fL   MCH 32.0 26.0 - 34.0 pg   MCHC 33.7 30.0 - 36.0 g/dL   RDW 12.8 11.5 - 15.5 %   Platelets 202 150 - 400 K/uL  Brain natriuretic peptide     Status: Abnormal   Collection Time: 04/27/15 11:40 AM  Result Value Ref Range   B Natriuretic Peptide 404.7 (H) 0.0 - 100.0 pg/mL  I-stat troponin, ED (not at [facility], [facility])     Status: None   Collection Time: 04/27/15 11:49 AM  Result Value Ref Range   Troponin i, poc 0.01 0.00 - 0.08 ng/mL   Comment 3            Comment: Due to the release kinetics of cTnI, a negative result within the first hours of the onset of symptoms does not rule out myocardial infarction with certainty. If myocardial infarction is still suspected, repeat the test at appropriate intervals.   Protime-INR     Status: None   Collection Time: 04/27/15  3:20 PM  Result Value Ref Range   Prothrombin Time 13.8 11.6 - 15.2 seconds   INR 1.04 0.00 - 1.49  TSH     Status: None   Collection Time: 04/27/15  3:20 PM  Result Value Ref Range   TSH 2.802 0.350 - 4.500 uIU/mL  D-dimer, quantitative (not at [facility])     Status: None   Collection  Time: 04/27/15  3:20 PM  Result Value Ref Range   D-Dimer, Quant 0.44 0.00 - 0.50 ug/mL-FEU    Comment: (NOTE) At the manufacturer cut-off of 0.50 ug/mL FEU, this assay has been documented to  exclude PE with a sensitivity and negative predictive value of 97 to 99%.  At this time, this assay has not been approved by the FDA to exclude DVT/VTE. Results should be correlated with clinical presentation.   Basic metabolic panel     Status: Abnormal   Collection Time: 04/28/15  6:27 AM  Result Value Ref Range   Sodium 143 135 - 145 mmol/L   Potassium 4.2 3.5 - 5.1 mmol/L   Chloride 107 101 - 111 mmol/L   CO2 25 22 - 32 mmol/L   Glucose, Bld 157 (H) 65 - 99 mg/dL   BUN 22 (H) 6 - 20 mg/dL   Creatinine, Ser 1.17 (H) 0.44 - 1.00 mg/dL   Calcium 9.2 8.9 - 10.3 mg/dL   GFR calc non Af Amer 45 (L) >60 mL/min   GFR calc Af Amer 52 (L) >60 mL/min    Comment: (NOTE) The eGFR has been calculated using the CKD EPI equation. This calculation has not been validated in all clinical situations. eGFR's persistently <60 mL/min signify possible Chronic Kidney Disease.    Anion gap 11 5 - 15  Magnesium     Status: None   Collection Time: 04/28/15  6:27 AM  Result Value Ref Range   Magnesium 2.1 1.7 - 2.4 mg/dL    Dg Chest 2 View  04/28/2015  CLINICAL DATA:  Shortness of breath today EXAM: CHEST  2 VIEW COMPARISON:  04/27/2015 FINDINGS: Bibasilar atelectasis. Small left pleural effusion. Heart is borderline in size. Postoperative changes in the left breast/axilla and left hilum. Improving interstitial prominence. IMPRESSION: Improving interstitial edema pattern. Bibasilar atelectasis with small left effusion. Electronically Signed   By: Rolm Baptise M.D.   On: 04/28/2015 07:25   Dg Chest 2 View  04/27/2015  CLINICAL DATA:  75 year old current history of asthma presenting with acute onset of shortness of breath and chest heaviness which began yesterday. Personal history of left upper lobe lung cancer  post resection in 2007 and personal history of left breast cancer post lumpectomy in 2016. EXAM: CHEST  2 VIEW COMPARISON:  12/18/2011 and earlier. FINDINGS: Cardiac silhouette upper normal in size to slightly enlarged, unchanged. Thoracic aorta mildly atherosclerotic and tortuous, unchanged. Hilar and mediastinal contours otherwise unremarkable. Interstitial opacities throughout both lungs associated with small bilateral pleural effusions. Mild atelectasis in the lower lobes. Postsurgical changes in the lingula and left hilum. Surgical clips in the left breast and left axilla prior lumpectomy and node dissection. Degenerative changes throughout the thoracic and lumbar spine with thoracolumbar scoliosis convex left. IMPRESSION: Mild diffuse interstitial pulmonary edema and small bilateral pleural effusions likely indicating mild CHF. However, lymphangitic carcinomatosis can have a similar appearance. Electronically Signed   By: Evangeline Dakin M.D.   On: 04/27/2015 11:15    Review of Systems  Constitutional: Positive for malaise/fatigue. Negative for fever, chills and diaphoresis.  HENT: Negative for congestion and sore throat.   Respiratory: Positive for shortness of breath. Negative for cough.   Cardiovascular: Positive for chest pain, orthopnea and leg swelling. Negative for palpitations and PND.  Gastrointestinal: Negative for nausea, vomiting, abdominal pain, blood in stool and melena.  Musculoskeletal: Negative for myalgias and neck pain.  Neurological: Positive for speech change. Negative for dizziness.        Confusion  All other systems reviewed and are negative.  Blood pressure 107/57, pulse 86, temperature 97.8 F (36.6 C), temperature source Oral, resp. rate 15, height 4' 11" (1.499 m), weight 146 lb 4.8 oz (66.361  kg), SpO2 96 %. Physical Exam  Nursing note and vitals reviewed. Constitutional: She is oriented to person, place, and time. She appears well-developed and well-nourished.  No distress.  HENT:  Head: Normocephalic and atraumatic.  Mouth/Throat: No oropharyngeal exudate.  Eyes: EOM are normal. Pupils are equal, round, and reactive to light. No scleral icterus.  Neck: Normal range of motion. Neck supple. No JVD present.  Cardiovascular: Normal rate, S1 normal and S2 normal.  An irregular rhythm present. Exam reveals no gallop.   No murmur heard. Pulses:      Radial pulses are 2+ on the right side, and 2+ on the left side.       Dorsalis pedis pulses are 2+ on the right side, and 2+ on the left side.  No carotid bruit  Respiratory: Effort normal and breath sounds normal. No respiratory distress. She has no wheezes. She has no rales.  GI: Soft. Bowel sounds are normal. She exhibits no distension. There is no tenderness.  Musculoskeletal: She exhibits no edema.  No lower extremity edema  Lymphadenopathy:    She has no cervical adenopathy.  Neurological: She is alert and oriented to person, place, and time. She exhibits normal muscle tone.  Skin: Skin is warm and dry.  Psychiatric: She has a normal mood and affect.    Assessment/Plan: Principal Problem:   Chest pain Active Problems:   Paroxysmal atrial fibrillation (HCC)   HYPERTENSION, BENIGN ESSENTIAL   GERD   CHEST PAIN   Hyperthyroidism   Pulmonary edema   Asthma   75 yo female with a history of lung cancer with resection in 2003, breast cancer- radiation therapy last year, GERD, HLD.  She presents with  Chest pain, confusion, fatigue. Initial troponin was negative.  EKG shows lateral Q waves and flattened T waves in V2 and 3.  Rechecking troponin this morning 2.  Chest pain seemed to be responsive to nitroglycerin.   Echocardiogram pending.  Lexiscan tomorrow unless the Troponin is positive or wall motion abnormality on echo.  NPO after MDN.   Review of telemetry appears to show a short run of atrial fibrillation last night it 0244 hrs. she is mostly in sinus rhythm.  TSH within normal limits.  She  also reports confusion since Saturday. Dr.Singh is checking a UA and MRI head.  Prolonged fatigued would coincide with a longer runs of Afib.  She needs IV heparin once MRI is done.  I would not start oral anticoagulation until stress test is complete.  Start lopressor 12.5 BID and monitor BP.  She did have an elevated BNP of 404.  Chest x-ray revealed mild diffuse interstitial pulmonary edema and small bilateral pleural effusions likely indicating mild CHF. She was given 40 mg of IV Lasix. Follow-up chest x-ray showed improving edema pattern.  SCr increase slightly.  She looks euvolemic right now.     Tarri Fuller, [place] 04/28/2015, 9:08 AM   The patient was seen and examined, and I agree with the assessment and plan as documented above, with modifications as noted below. Pt with aforementioned presentation and admitted with chest pain, shortness of breath, leg swelling, pulmonary edema, and fatigue. ECG with nonspecific abnormalities. Telemetry demonstrates paroxysms of atrial fibrillation. Echo pending. One troponin normal, others to be checked this morning. If normal, Lexiscan Cardiolite stress test on 2/27.  Brain MRI shows multifocal areas of acute infarction affecting right MCA territory. Based on this as well as documented atrial fibrillation, she will require direct oral anticoagulant therapy. However,  would defer on this until after stress testing is completed as she may require coronary angiography as well. Discussed with Dr. Candiss Norse. I have recommended she be placed on IV heparin if permissible with neurology. Continue metoprolol and simvastatin.   Kate Sable, MD, [facility]  04/28/2015 10:45 AM

## 2015-04-28 NOTE — Consult Note (Signed)
NEURO HOSPITALIST CONSULT NOTE   Requestig physician: Dr. Candiss Norse   Reason for Consult: Dysphasia.   HPI:                                                                                                                                          Patricia [name] is an 75 y.o. female admitted for evaluation of chest pain yesterday. On admission she stated that her speech had been slurred for 2 days. A new diagnosis of paroxysmal atrial fibrillation was made this admission. Has chads-vasc score of 4. Given the slurred speech and atrial fibrillation, possible stroke was suspected. 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 Shower emboli were suspected based upon the overall MRI appearance. She was started on heparin without bolus and statin was continued. Plan was to switch anticoagulation to Eliquis following cardiac catheterization.   Past Medical History  Diagnosis Date  . Cough variant asthma   . Osteopenia   . Stricture and stenosis of cervix   . Postmenopausal   . GERD (gastroesophageal reflux disease)   . Disease of pharynx or nasopharynx   . Elevated BP   . Neoplasm   . Personal history of malignant neoplasm of bronchus and lung   . Allergic rhinitis, cause unspecified   . Diverticulosis   . Internal hemorrhoids   . Hiatal hernia   . Hypertension   . Laryngospasm   . 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 Hyperlipidemia   . Glaucoma   . Laryngospasm   . Multinodular goiter   . Chronic cough   . 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 Radiation 10/22/14-11/23/14    Left Breast    Past Surgical History  Procedure Laterality Date  . Lung cancer surgery  2003    resection carcinoid lingula-lt upper lobe  . Colonoscopy    . Radioactive seed guided mastectomy with axillary sentinel lymph node biopsy Left 06/26/2014    Procedure: RADIOACTIVE SEED GUIDED PARTIAL MASTECTOMY WITH AXILLARY  SENTINEL LYMPH NODE BIOPSY;  Surgeon: Stark Klein, MD;  Location: [place];  Service: General;  Laterality: Left;  . Breast surgery    . Re-excision of breast lumpectomy Left 08/07/2014    Procedure: RE-EXCISION OF LEFT BREAST LUMPECTOMY;  Surgeon: Stark Klein, MD;  Location: [place];  Service: General;  Laterality: Left;    Family History  Problem Relation Age of Onset  . Stroke Mother   . Hypertension Mother   . Hyperlipidemia Mother   . Stroke Father   . Transient ischemic attack Father   . Hyperlipidemia Father   . Hypertension Father     Social History:  reports that  she quit smoking about 51 years ago. She does not have any smokeless tobacco history on file. She reports that she drinks alcohol. She reports that she does not use illicit drugs.  Allergies  Allergen Reactions  . Combigan [Brimonidine Tartrate-Timolol]     Eyes itch, reddened  . Sulfa Antibiotics Rash    MEDICATIONS:                                                                                                                     . gabapentin  400 mg Oral BID  . guaiFENesin  600 mg Oral BID  . latanoprost  1 drop Both Eyes QHS  . metoprolol tartrate  12.5 mg Oral BID  . mometasone-formoterol  2 puff Inhalation BID  . pantoprazole  40 mg Oral Daily  . simvastatin  40 mg Oral QHS  . tamoxifen  20 mg Oral BID  . thyroid  30 mg Oral QAC breakfast   Continuous medications: IV heparin   ROS:                                                                                                                                       History obtained from patient. No headache, abdominal pain or limb pain. No fevers or chills.   Blood pressure 109/79, pulse 76, temperature 98.4 F (36.9 C), temperature source Oral, resp. rate 16, height '4\' 11"'$  (1.499 m), weight 66.361 kg (146 lb 4.8 oz), SpO2 95 %.  Neurologic Examination:                                                                                                       HEENT-  Normocephalic, no lesions, without obvious abnormality.   Extremities - Warm and well-perfused  Neurological Examination Mental Status: Alert, oriented, thought content appropriate.  Speech fluent with intact naming, comprehension and fluency. Occasional phonemic paraphasia noted.  Cranial Nerves: II: Visual fields intact, PERRL III,IV, VI: ptosis not present, EOMI, no nystagmus V,VII: smile  symmetric, facial light touch sensation normal bilaterally VIII: hearing intact to conversation IX,X: no dysphonia XI: bilateral shoulder shrug equal XII: midline tongue extension Motor: Right : Upper extremity   5/5    Left:     Upper extremity   5/5  Lower extremity   5/5     Lower extremity   5/5 Normal tone throughout; no atrophy noted Sensory: Light touch intact x 4 without extinction Deep Tendon Reflexes: 2+ and symmetric throughout Cerebellar: normal finger-to-nose bilaterally.  Gait: Deferred.    Lab Results: Basic Metabolic Panel:  Recent Labs Lab 04/27/15 1140 04/28/15 0627  NA 140 143  K 4.2 4.2  CL 110 107  CO2 21* 25  GLUCOSE 92 157*  BUN 18 22*  CREATININE 0.91 1.17*  CALCIUM 8.7* 9.2  MG  --  2.1    Liver Function Tests: No results for input(s): AST, ALT, ALKPHOS, BILITOT, PROT, ALBUMIN in the last 168 hours. No results for input(s): LIPASE, AMYLASE in the last 168 hours. No results for input(s): AMMONIA in the last 168 hours.  CBC:  Recent Labs Lab 04/27/15 1140  WBC 8.0  HGB 13.9  HCT 41.3  MCV 94.9  PLT 202    Cardiac Enzymes:  Recent Labs Lab 04/28/15 0806 04/28/15 1502  TROPONINI <0.03 <0.03    Lipid Panel: No results for input(s): CHOL, TRIG, HDL, CHOLHDL, VLDL, LDLCALC in the last 168 hours.  CBG: No results for input(s): GLUCAP in the last 168 hours.  Microbiology: No results found for this or any previous visit.  Coagulation Studies:  Recent Labs  04/27/15 1520  LABPROT 13.8  INR  1.04    Imaging: Dg Chest 2 View  04/28/2015  CLINICAL DATA:  Shortness of breath today EXAM: CHEST  2 VIEW COMPARISON:  04/27/2015 FINDINGS: Bibasilar atelectasis. Small left pleural effusion. Heart is borderline in size. Postoperative changes in the left breast/axilla and left hilum. Improving interstitial prominence. IMPRESSION: Improving interstitial edema pattern. Bibasilar atelectasis with small left effusion. Electronically Signed   By: Rolm Baptise M.D.   On: 04/28/2015 07:25   Dg Chest 2 View  04/27/2015  CLINICAL DATA:  75 year old current history of asthma presenting with acute onset of shortness of breath and chest heaviness which began yesterday. Personal history of left upper lobe lung cancer post resection in 2007 and personal history of left breast cancer post lumpectomy in 2016. EXAM: CHEST  2 VIEW COMPARISON:  12/18/2011 and earlier. FINDINGS: Cardiac silhouette upper normal in size to slightly enlarged, unchanged. Thoracic aorta mildly atherosclerotic and tortuous, unchanged. Hilar and mediastinal contours otherwise unremarkable. Interstitial opacities throughout both lungs associated with small bilateral pleural effusions. Mild atelectasis in the lower lobes. Postsurgical changes in the lingula and left hilum. Surgical clips in the left breast and left axilla prior lumpectomy and node dissection. Degenerative changes throughout the thoracic and lumbar spine with thoracolumbar scoliosis convex left. IMPRESSION: Mild diffuse interstitial pulmonary edema and small bilateral pleural effusions likely indicating mild CHF. However, lymphangitic carcinomatosis can have a similar appearance. Electronically Signed   By: Evangeline Dakin M.D.   On: 04/27/2015 11:15   [name] Contrast  04/28/2015  CLINICAL DATA:  Chest pain.  Confusion.  Breast cancer. EXAM: MRI HEAD WITHOUT CONTRAST TECHNIQUE: Multiplanar, multiecho pulse sequences of the brain and surrounding structures were obtained without  intravenous contrast. COMPARISON:  MR brain 05/16/2004. FINDINGS: Small foci of restricted diffusion are seen in the RIGHT parietal cortex, RIGHT insula, and RIGHT temporal operculum,  consistent with multifocal areas of acute infarction. Although they are clustered within the same vascular distribution (RIGHT ICA/MCA), shower of emboli is suggested as the distribution is not typical of a watershed insult. No other areas infarction are seen. No hemorrhage, mass lesion, hydrocephalus, or extra-axial fluid. Moderate atrophy. T2 and FLAIR hyperintensity throughout the periventricular and subcortical white matter, likely chronic microvascular ischemic change. LEFT parietal subcortical infarct was present 2006. Flow voids are maintained throughout the carotid, basilar, and vertebral arteries. There are no areas of chronic hemorrhage. No intracranial large vessel occlusion. Normal pituitary and cerebellar tonsils. Advanced cervical spondylosis at C2-3, C3-4, and C4-5. Cord flattening may be present at the C4-5 level, similar to priors. There is no sinus disease. Negative orbits. 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 IMPRESSION: Multifocal subcentimeter areas of acute infarction affect the RIGHT MCA territory. Shower of emboli is suspected. No hemorrhage or significant mass effect. Chronic changes of atrophy and white matter disease as described. No MR evidence for large vessel occlusion. Cervical spondylosis, most severe at C4-C5. Electronically Signed   By: Staci Righter M.D.   On: 04/28/2015 10:33   Assessment: 1. Acute right opercular ischemic stroke. Most likely secondary to atrial fibrillation. The stroke is small in size with low risk of hemorrhagic conversion.  2. Degenerative cervical spondylosis. If clinically indicated, an MRI of the cervical spine can be obtained to further evaluate.  3.  HTN  Recommendations: 1. MRA of the brain without contrast 2. HgbA1c, fasting lipid panel 3. PT consult, OT consult, Speech consult 4. Carotid dopplers 5. Continue IV heparin. Low risk of hemorrhagic conversion given small size of the stroke. Agree with switching to oral anticoagulant following catheterization 6. BP control 7. Continue statin 8. Telemetry monitoring 9. Frequent neuro checks 10. NPO until passes stroke swallow screen   Kerney Elbe, MD 04/28/2015, 8:29 PM

## 2015-04-28 NOTE — Progress Notes (Signed)
  Echocardiogram 2D Echocardiogram has been performed.  Johny Chess 04/28/2015, 1:18 PM

## 2015-04-28 NOTE — Progress Notes (Addendum)
Patient Demographics:    Patricia [name], is a 75 y.o. female, DOB - 27-Apr-1940, YPP:[id number]  Admit date - 04/27/2015   Admitting Physician Reyne Dumas, MD  Outpatient Primary MD for the patient is Annye Asa, MD  LOS -    Chief Complaint  Patient presents with  . Shortness of Breath  . Chest Pain        Subjective:    Patricia [name] today has, No headache, No chest pain, No abdominal pain - No Nausea, No new weakness tingling or numbness, No Cough - SOB. Slight problems with her speech.   Assessment  & Plan :     1. Chest pain. Ruled out for MI, cardiology on board, she is now pain-free, on beta blocker and statin, due for left heart cath on 04/29/2015.  2. Paroxysmal atrial fibrillation diagnosed this admission. [place] vasc 2 score of 4 above. Discussed with both cardiology and neurology, for now heparin drip without bolus, full stroke protocol initiated. Neurology consulted. Continue statin.  3. 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 neuro consulted, stroke protocol initiated, Heparin without bolus, after her left heart catheterization on 04/29/2015 she will be switched to Eliquis, continue statin.  4. Mild asthma exacerbation upon admission. Resolved. Stop steroids and monitor with supportive care.  5. Hypothyroidism. Continue supplementation.  6. Dyslipidemia. On statin continue. Repeat lipid panel pending.  7. GERD. On PPI continue.  8. Essential hypertension. Continue present regimen.  9. History of carcinoid lung cancer and breast cancer. Continue follow-up with Dr. [name]. Repeat 2 view chest x-ray in 7-10 days.    Code Status : Full  Family Communication  : None  Disposition Plan  : Home 1-2 days  Consults  :  Cards,  Neuro  Procedures  :   MRI brain. Right MCA territory embolic infarcts.  Echogram  Carotid duplex  Left heart cath 04/29/2015  DVT Prophylaxis  :    Heparin gtt  Lab Results  Component Value Date   PLT 202 04/27/2015    Inpatient Medications  Scheduled Meds: .  stroke: mapping our early stages of recovery book   Does not apply Once  . enoxaparin (LOVENOX) injection  40 mg Subcutaneous Q24H  . gabapentin  400 mg Oral BID  . guaiFENesin  600 mg Oral BID  . latanoprost  1 drop Both Eyes QHS  . metoprolol tartrate  12.5 mg Oral BID  . mometasone-formoterol  2 puff Inhalation BID  . pantoprazole  40 mg Oral Daily  . simvastatin  40 mg Oral QHS  . sodium chloride flush  3 mL Intravenous Q12H  . tamoxifen  20 mg Oral BID  . thyroid  30 mg Oral QAC breakfast   Continuous Infusions:  PRN Meds:.sodium chloride, acetaminophen, guaiFENesin-dextromethorphan, levalbuterol, ondansetron (ZOFRAN) IV, sodium chloride flush  Antibiotics  :     Anti-infectives    None        Objective:   Filed Vitals:   04/27/15 1802 04/27/15 1824 04/28/15 0501 04/28/15 0819  BP:  151/82 107/57   Pulse:  87 86   Temp:  98.6 F (37 C) 97.8 F (36.6 C)   TempSrc:  Oral Oral   Resp:   15  Height: '4\' 11"'$  (1.499 m)     Weight: 67.1 kg (147 lb 14.9 oz)  66.361 kg (146 lb 4.8 oz)   SpO2:  97% 91% 96%    Wt Readings from Last 3 Encounters:  04/28/15 66.361 kg (146 lb 4.8 oz)  02/14/15 65.998 kg (145 lb 8 oz)  12/20/14 65.908 kg (145 lb 4.8 oz)     Intake/Output Summary (Last 24 hours) at 04/28/15 1050 Last data filed at 04/27/15 2209  Gross per 24 hour  Intake    243 ml  Output    900 ml  Net   -657 ml     Physical Exam  Awake Alert, Oriented X 3, No new F.N deficits, Mild dysarthria, Normal affect [place].AT,PERRAL Supple Neck,No JVD, No cervical lymphadenopathy appriciated.  Symmetrical Chest wall movement, Good air movement bilaterally, CTAB RRR,No Gallops,Rubs or new Murmurs, No  Parasternal Heave +ve B.Sounds, Abd Soft, No tenderness, No organomegaly appriciated, No rebound - guarding or rigidity. No Cyanosis, Clubbing or edema, No new Rash or bruise      Data Review:   Micro Results No results found for this or any previous visit (from the past 240 hour(s)).  Radiology Reports Dg Chest 2 View  04/28/2015  CLINICAL DATA:  Shortness of breath today EXAM: CHEST  2 VIEW COMPARISON:  04/27/2015 FINDINGS: Bibasilar atelectasis. Small left pleural effusion. Heart is borderline in size. Postoperative changes in the left breast/axilla and left hilum. Improving interstitial prominence. IMPRESSION: Improving interstitial edema pattern. Bibasilar atelectasis with small left effusion. Electronically Signed   By: Rolm Baptise M.D.   On: 04/28/2015 07:25   Dg Chest 2 View  04/27/2015  CLINICAL DATA:  75 year old current history of asthma presenting with acute onset of shortness of breath and chest heaviness which began yesterday. Personal history of left upper lobe lung cancer post resection in 2007 and personal history of left breast cancer post lumpectomy in 2016. EXAM: CHEST  2 VIEW COMPARISON:  12/18/2011 and earlier. FINDINGS: Cardiac silhouette upper normal in size to slightly enlarged, unchanged. Thoracic aorta mildly atherosclerotic and tortuous, unchanged. Hilar and mediastinal contours otherwise unremarkable. Interstitial opacities throughout both lungs associated with small bilateral pleural effusions. Mild atelectasis in the lower lobes. Postsurgical changes in the lingula and left hilum. Surgical clips in the left breast and left axilla prior lumpectomy and node dissection. Degenerative changes throughout the thoracic and lumbar spine with thoracolumbar scoliosis convex left. IMPRESSION: Mild diffuse interstitial pulmonary edema and small bilateral pleural effusions likely indicating mild CHF. However, lymphangitic carcinomatosis can have a similar appearance. Electronically  Signed   By: Evangeline Dakin M.D.   On: 04/27/2015 11:15   [name] Contrast  04/28/2015  CLINICAL DATA:  Chest pain.  Confusion.  Breast cancer. EXAM: MRI HEAD WITHOUT CONTRAST TECHNIQUE: Multiplanar, multiecho pulse sequences of the brain and surrounding structures were obtained without intravenous contrast. COMPARISON:  MR brain 05/16/2004. FINDINGS: Small foci of restricted diffusion are seen in the RIGHT parietal cortex, RIGHT insula, and RIGHT temporal operculum, consistent with multifocal areas of acute infarction. Although they are clustered within the same vascular distribution (RIGHT ICA/MCA), shower of emboli is suggested as the distribution is not typical of a watershed insult. No other areas infarction are seen. No hemorrhage, mass lesion, hydrocephalus, or extra-axial fluid. Moderate atrophy. T2 and FLAIR hyperintensity throughout the periventricular and subcortical white matter, likely chronic microvascular ischemic change. LEFT parietal subcortical infarct was present 2006. Flow voids are maintained throughout the carotid, basilar, and  vertebral arteries. There are no areas of chronic hemorrhage. No intracranial large vessel occlusion. Normal pituitary and cerebellar tonsils. Advanced cervical spondylosis at C2-3, C3-4, and C4-5. Cord flattening may be present at the C4-5 level, similar to priors. There is no sinus disease. Negative orbits. 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 IMPRESSION: Multifocal subcentimeter areas of acute infarction affect the RIGHT MCA territory. Shower of emboli is suspected. No hemorrhage or significant mass effect. Chronic changes of atrophy and white matter disease as described. No MR evidence for large vessel occlusion. Cervical spondylosis, most severe at C4-C5. Electronically Signed   By: Staci Righter M.D.   On: 04/28/2015 10:33     CBC  Recent Labs Lab  04/27/15 1140  WBC 8.0  HGB 13.9  HCT 41.3  PLT 202  MCV 94.9  MCH 32.0  MCHC 33.7  RDW 12.8    Chemistries   Recent Labs Lab 04/27/15 1140 04/28/15 0627  NA 140 143  K 4.2 4.2  CL 110 107  CO2 21* 25  GLUCOSE 92 157*  BUN 18 22*  CREATININE 0.91 1.17*  CALCIUM 8.7* 9.2  MG  --  2.1   ------------------------------------------------------------------------------------------------------------------ No results for input(s): CHOL, HDL, LDLCALC, TRIG, CHOLHDL, LDLDIRECT in the last 72 hours.  Lab Results  Component Value Date   HGBA1C 5.7 08/01/2012   ------------------------------------------------------------------------------------------------------------------  Recent Labs  04/27/15 1520  TSH 2.802   ------------------------------------------------------------------------------------------------------------------ No results for input(s): VITAMINB12, FOLATE, FERRITIN, TIBC, IRON, RETICCTPCT in the last 72 hours.  Coagulation profile  Recent Labs Lab 04/27/15 1520  INR 1.04     Recent Labs  04/27/15 1520  DDIMER 0.44    Cardiac Enzymes  Recent Labs Lab 04/28/15 0806  TROPONINI <0.03   ------------------------------------------------------------------------------------------------------------------    Component Value Date/Time   BNP 404.7* 04/27/2015 1140    Time Spent in minutes  30   SINGH,PRASHANT K M.D on 04/28/2015 at 10:50 AM  Between 7am to 7pm - Pager - [phone]  After 7pm go to [email or web address] - password [facility]  [facility] -  Office  [phone]

## 2015-04-28 NOTE — Progress Notes (Signed)
[place] for Heparin Indication: chest pain/ACS, new atrial fibrillation and recent stroke  Allergies  Allergen Reactions  . Combigan [Brimonidine Tartrate-Timolol]     Eyes itch, reddened  . Sulfa Antibiotics Rash    Patient Measurements: Height: '4\' 11"'$  (149.9 cm) Weight: 146 lb 4.8 oz (66.361 kg) IBW/kg (Calculated) : 43.2 Heparin Dosing Weight: 58 kg  Vital Signs: Temp: 98.4 F (36.9 C) (02/26 1550) Temp Source: Oral (02/26 1550) BP: 109/79 mmHg (02/26 1550) Pulse Rate: 76 (02/26 1550)  Labs:  Recent Labs  04/27/15 1140 04/27/15 1520 04/28/15 0627 04/28/15 0806 04/28/15 1502 04/28/15 2011  HGB 13.9  --   --   --   --   --   HCT 41.3  --   --   --   --   --   PLT 202  --   --   --   --   --   LABPROT  --  13.8  --   --   --   --   INR  --  1.04  --   --   --   --   HEPARINUNFRC  --   --   --   --   --  0.22*  CREATININE 0.91  --  1.17*  --   --   --   TROPONINI  --   --   --  <0.03 <0.03  --     Estimated Creatinine Clearance: 35 mL/min (by C-G formula based on Cr of 1.17).   Medical History: Past Medical History  Diagnosis Date  . Cough variant asthma   . Osteopenia   . Stricture and stenosis of cervix   . Postmenopausal   . GERD (gastroesophageal reflux disease)   . Disease of pharynx or nasopharynx   . Elevated BP   . Neoplasm   . Personal history of malignant neoplasm of bronchus and lung   . Allergic rhinitis, cause unspecified   . Diverticulosis   . Internal hemorrhoids   . Hiatal hernia   . Hypertension   . Laryngospasm   . Cancer [facility])     lung carcinoid tumor removed 6 year ago  . Hyperlipidemia   . Glaucoma   . Laryngospasm   . Multinodular goiter   . Chronic cough   . 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 Radiation 10/22/14-11/23/14    Left Breast    Medications:  Prescriptions prior to admission  Medication Sig Dispense Refill Last Dose  . albuterol (VENTOLIN HFA) 108 (90 BASE) MCG/ACT inhaler  Inhale 2 puffs into the lungs every 4 (four) hours as needed. (Patient taking differently: Inhale 2 puffs into the lungs as needed for wheezing or shortness of breath. ) 1 Inhaler 3 04/26/2015 at Unknown time  . ARMOUR THYROID 30 MG tablet Take 1 tablet (30 mg total) by mouth daily before breakfast. 90 tablet 1 04/27/2015 at Unknown time  . aspirin EC 81 MG tablet Take 81 mg by mouth daily.   04/26/2015 at Unknown time  . bimatoprost (LUMIGAN) 0.03 % ophthalmic drops Place 1 drop into both eyes at bedtime.    04/26/2015 at Unknown time  . dorzolamide (TRUSOPT) 2 % ophthalmic solution Place 1 drop into both eyes 2 (two) times daily.   04/27/2015 at Unknown time  . fluticasone-salmeterol (ADVAIR HFA) 230-21 MCG/ACT inhaler INHALE 2 PUFFS INTO THE LUNGS TWICE A DAY 3 Inhaler 2 04/26/2015 at Unknown time  . gabapentin (NEURONTIN)  400 MG capsule Take 1 capsule (400 mg total) by mouth 2 (two) times daily. 180 capsule 1 04/26/2015 at Unknown time  . loratadine (CLARITIN) 10 MG tablet Take 1 tablet (10 mg total) by mouth daily. (Patient taking differently: Take 10 mg by mouth daily as needed for allergies. ) 30 tablet 0 Past Month at Unknown time  . Multiple Vitamins-Iron (MULTIVITAMIN/IRON) TABS Take 1 tablet by mouth daily.    04/26/2015 at Unknown time  . omeprazole (PRILOSEC) 20 MG capsule TAKE 1 CAPSULE DAILY 30 MINUTES BEFORE A MEAL 90 capsule 1 04/26/2015 at Unknown time  . simvastatin (ZOCOR) 40 MG tablet Take 1 tablet (40 mg total) by mouth at bedtime. 90 tablet 1 04/26/2015 at Unknown time  . tamoxifen (NOLVADEX) 20 MG tablet Take 1 tablet (20 mg total) by mouth daily. 90 tablet 4 04/26/2015 at Unknown time  . traMADol (ULTRAM) 50 MG tablet Take 1 tablet (50 mg total) by mouth every 6 (six) hours as needed. for pain 45 tablet 0 04/27/2015 at Unknown time  . 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 Trop  neg and seen by cards who recommended stress test 2/27. Of note, pt also reported increased confusion so MD checked MRI which showed multifocal areas of infarct. Recent stroke but unsure of timing based on patient report of increased confusion for the last week.  Also found to have PAF.  To start low dose heparin.  Ultimately will need oral anticoagulation. Patient did receive Lovenox '40mg'$  SQ 2/25 at 1835.  Initial HL low (0.22) on 750 units/h. No bleed/iv line issues per RN.  Goal of Therapy:  Heparin level 0.3-0.5 units/ml Monitor platelets by anticoagulation protocol: Yes   Plan:  Increase heparin to 850 units/h 8h HL Daily HL/CBC Monitor s/sx bleeding  Elicia Lamp, PharmD, BCPS Clinical Pharmacist Pager [phone] 04/28/2015 9:00 PM

## 2015-04-28 NOTE — Progress Notes (Signed)
ANTICOAGULATION CONSULT NOTE - Initial Consult  Pharmacy Consult for Heparin Indication: chest pain/ACS, new atrial fibrillation and recent stroke  Allergies  Allergen Reactions  . Combigan [Brimonidine Tartrate-Timolol]     Eyes itch, reddened  . Sulfa Antibiotics Rash    Patient Measurements: Height: '4\' 11"'$  (149.9 cm) Weight: 146 lb 4.8 oz (66.361 kg) IBW/kg (Calculated) : 43.2 Heparin Dosing Weight: 58 kg  Vital Signs: Temp: 97.8 F (36.6 C) (02/26 0501) Temp Source: Oral (02/26 0501) BP: 107/57 mmHg (02/26 0501) Pulse Rate: 86 (02/26 0501)  Labs:  Recent Labs  04/27/15 1140 04/27/15 1520 04/28/15 0627 04/28/15 0806  HGB 13.9  --   --   --   HCT 41.3  --   --   --   PLT 202  --   --   --   LABPROT  --  13.8  --   --   INR  --  1.04  --   --   CREATININE 0.91  --  1.17*  --   TROPONINI  --   --   --  <0.03    Estimated Creatinine Clearance: 35 mL/min (by C-G formula based on Cr of 1.17).   Medical History: Past Medical History  Diagnosis Date  . Cough variant asthma   . Osteopenia   . Stricture and stenosis of cervix   . Postmenopausal   . GERD (gastroesophageal reflux disease)   . Disease of pharynx or nasopharynx   . Elevated BP   . Neoplasm   . Personal history of malignant neoplasm of bronchus and lung   . Allergic rhinitis, cause unspecified   . Diverticulosis   . Internal hemorrhoids   . Hiatal hernia   . Hypertension   . Laryngospasm   . Cancer [facility])     lung carcinoid tumor removed 6 year ago  . Hyperlipidemia   . Glaucoma   . Laryngospasm   . Multinodular goiter   . Chronic cough   . 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 Radiation 10/22/14-11/23/14    Left Breast    Medications:  Prescriptions prior to admission  Medication Sig Dispense Refill Last Dose  . albuterol (VENTOLIN HFA) 108 (90 BASE) MCG/ACT inhaler Inhale 2 puffs into the lungs every 4 (four) hours as needed. (Patient taking differently: Inhale 2 puffs into the lungs as  needed for wheezing or shortness of breath. ) 1 Inhaler 3 04/26/2015 at Unknown time  . ARMOUR THYROID 30 MG tablet Take 1 tablet (30 mg total) by mouth daily before breakfast. 90 tablet 1 04/27/2015 at Unknown time  . aspirin EC 81 MG tablet Take 81 mg by mouth daily.   04/26/2015 at Unknown time  . bimatoprost (LUMIGAN) 0.03 % ophthalmic drops Place 1 drop into both eyes at bedtime.    04/26/2015 at Unknown time  . dorzolamide (TRUSOPT) 2 % ophthalmic solution Place 1 drop into both eyes 2 (two) times daily.   04/27/2015 at Unknown time  . fluticasone-salmeterol (ADVAIR HFA) 230-21 MCG/ACT inhaler INHALE 2 PUFFS INTO THE LUNGS TWICE A DAY 3 Inhaler 2 04/26/2015 at Unknown time  . gabapentin (NEURONTIN) 400 MG capsule Take 1 capsule (400 mg total) by mouth 2 (two) times daily. 180 capsule 1 04/26/2015 at Unknown time  . loratadine (CLARITIN) 10 MG tablet Take 1 tablet (10 mg total) by mouth daily. (Patient taking differently: Take 10 mg by mouth daily as needed for allergies. ) 30 tablet 0 Past Month at Unknown  time  . Multiple Vitamins-Iron (MULTIVITAMIN/IRON) TABS Take 1 tablet by mouth daily.    04/26/2015 at Unknown time  . omeprazole (PRILOSEC) 20 MG capsule TAKE 1 CAPSULE DAILY 30 MINUTES BEFORE A MEAL 90 capsule 1 04/26/2015 at Unknown time  . simvastatin (ZOCOR) 40 MG tablet Take 1 tablet (40 mg total) by mouth at bedtime. 90 tablet 1 04/26/2015 at Unknown time  . tamoxifen (NOLVADEX) 20 MG tablet Take 1 tablet (20 mg total) by mouth daily. 90 tablet 4 04/26/2015 at Unknown time  . traMADol (ULTRAM) 50 MG tablet Take 1 tablet (50 mg total) by mouth every 6 (six) hours as needed. for pain 45 tablet 0 04/27/2015 at Unknown time  . 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 Trop neg and seen by cards who recommended stress test 2/27. Of note, pt also reported increased confusion so MD checked MRI  which showed multifocal areas of infarct. Recent stroke but unsure of timing based on patient report of increased confusion for the last week.  Also found to have PAF.  To start low dose heparin.  Ultimately will need oral anticoagulation.  Patient did receive Lovenox '40mg'$  SQ 2/25 at 1835  Goal of Therapy:  Heparin level 0.3-0.5 units/ml Monitor platelets by anticoagulation protocol: Yes   Plan:  Start heparin infusion at 750 units/hr Check anti-Xa level in 8 hours and daily while on heparin Continue to monitor H&H and platelets  [employer], Pharm.D., BCPS Clinical Pharmacist Pager [phone] 04/28/2015 11:36 AM

## 2015-04-29 ENCOUNTER — Encounter (HOSPITAL_COMMUNITY): Payer: Self-pay | Admitting: Radiology

## 2015-04-29 ENCOUNTER — Observation Stay (HOSPITAL_COMMUNITY): Payer: Medicare Other

## 2015-04-29 ENCOUNTER — Encounter (HOSPITAL_COMMUNITY): Payer: Self-pay

## 2015-04-29 DIAGNOSIS — J45901 Unspecified asthma with (acute) exacerbation: Secondary | ICD-10-CM | POA: Diagnosis not present

## 2015-04-29 DIAGNOSIS — R079 Chest pain, unspecified: Secondary | ICD-10-CM | POA: Diagnosis not present

## 2015-04-29 DIAGNOSIS — J4520 Mild intermittent asthma, uncomplicated: Secondary | ICD-10-CM | POA: Diagnosis not present

## 2015-04-29 DIAGNOSIS — I480 Paroxysmal atrial fibrillation: Secondary | ICD-10-CM | POA: Diagnosis not present

## 2015-04-29 DIAGNOSIS — I63412 Cerebral infarction due to embolism of left middle cerebral artery: Secondary | ICD-10-CM | POA: Diagnosis not present

## 2015-04-29 DIAGNOSIS — E0590 Thyrotoxicosis, unspecified without thyrotoxic crisis or storm: Secondary | ICD-10-CM | POA: Diagnosis not present

## 2015-04-29 DIAGNOSIS — R918 Other nonspecific abnormal finding of lung field: Secondary | ICD-10-CM | POA: Diagnosis not present

## 2015-04-29 DIAGNOSIS — I63411 Cerebral infarction due to embolism of right middle cerebral artery: Secondary | ICD-10-CM | POA: Diagnosis not present

## 2015-04-29 DIAGNOSIS — I6300 Cerebral infarction due to thrombosis of unspecified precerebral artery: Secondary | ICD-10-CM | POA: Diagnosis not present

## 2015-04-29 DIAGNOSIS — I639 Cerebral infarction, unspecified: Secondary | ICD-10-CM | POA: Diagnosis not present

## 2015-04-29 DIAGNOSIS — I10 Essential (primary) hypertension: Secondary | ICD-10-CM | POA: Diagnosis not present

## 2015-04-29 LAB — LIPID PANEL
CHOL/HDL RATIO: 2.9 ratio
Cholesterol: 167 mg/dL (ref 0–200)
HDL: 57 mg/dL (ref >40)
LDL Cholesterol: 95 mg/dL (ref 0–99)
Triglycerides: 75 mg/dL (ref <150)
VLDL: 15 mg/dL (ref 0–40)

## 2015-04-29 LAB — CBC
HEMATOCRIT: 39.8 % (ref 36.0–46.0)
HEMOGLOBIN: 13.1 g/dL (ref 12.0–15.0)
MCH: 31.1 pg (ref 26.0–34.0)
MCHC: 32.9 g/dL (ref 30.0–36.0)
MCV: 94.5 fL (ref 78.0–100.0)
Platelets: 195 10*3/uL (ref 150–400)
RBC: 4.21 MIL/uL (ref 3.87–5.11)
RDW: 13 % (ref 11.5–15.5)
WBC: 11.1 10*3/uL — ABNORMAL HIGH (ref 4.0–10.5)

## 2015-04-29 LAB — BASIC METABOLIC PANEL
ANION GAP: 11 (ref 5–15)
BUN: 23 mg/dL — AB (ref 6–20)
CALCIUM: 8.7 mg/dL — AB (ref 8.9–10.3)
CO2: 22 mmol/L (ref 22–32)
Chloride: 107 mmol/L (ref 101–111)
Creatinine, Ser: 1.01 mg/dL — ABNORMAL HIGH (ref 0.44–1.00)
GFR calc Af Amer: >60 mL/min (ref >60)
GFR calc non Af Amer: 53 mL/min — ABNORMAL LOW (ref >60)
GLUCOSE: 112 mg/dL — AB (ref 65–99)
Potassium: 3.9 mmol/L (ref 3.5–5.1)
Sodium: 140 mmol/L (ref 135–145)

## 2015-04-29 LAB — URINE CULTURE

## 2015-04-29 LAB — HEPARIN LEVEL (UNFRACTIONATED)
HEPARIN UNFRACTIONATED: 0.51 [IU]/mL (ref 0.30–0.70)
HEPARIN UNFRACTIONATED: 0.34 [IU]/mL (ref 0.30–0.70)

## 2015-04-29 MED ORDER — METOPROLOL TARTRATE 25 MG PO TABS
25.0000 mg | ORAL_TABLET | Freq: Two times a day (BID) | ORAL | Status: DC
  Administered 2015-04-29 – 2015-05-02 (×7): 25 mg via ORAL
  Filled 2015-04-29 (×7): qty 1

## 2015-04-29 MED ORDER — ROSUVASTATIN CALCIUM 10 MG PO TABS
40.0000 mg | ORAL_TABLET | Freq: Every day | ORAL | Status: DC
  Administered 2015-04-29 – 2015-05-01 (×3): 40 mg via ORAL
  Filled 2015-04-29 (×3): qty 4

## 2015-04-29 MED ORDER — METOPROLOL TARTRATE 1 MG/ML IV SOLN
5.0000 mg | INTRAVENOUS | Status: DC | PRN
  Administered 2015-04-29: 5 mg via INTRAVENOUS
  Filled 2015-04-29 (×2): qty 5

## 2015-04-29 MED ORDER — IOHEXOL 350 MG/ML SOLN
50.0000 mL | Freq: Once | INTRAVENOUS | Status: AC | PRN
  Administered 2015-04-29: 50 mL via INTRAVENOUS

## 2015-04-29 MED ORDER — TECHNETIUM TC 99M SESTAMIBI GENERIC - CARDIOLITE
10.0000 | Freq: Once | INTRAVENOUS | Status: AC | PRN
  Administered 2015-04-29: 10 via INTRAVENOUS

## 2015-04-29 MED ORDER — DILTIAZEM HCL 25 MG/5ML IV SOLN
10.0000 mg | Freq: Four times a day (QID) | INTRAVENOUS | Status: DC | PRN
  Administered 2015-04-29 – 2015-04-30 (×2): 10 mg via INTRAVENOUS
  Filled 2015-04-29 (×4): qty 5

## 2015-04-29 MED ORDER — DILTIAZEM HCL 100 MG IV SOLR
5.0000 mg/h | INTRAVENOUS | Status: DC
  Administered 2015-04-29: 5 mg/h via INTRAVENOUS
  Administered 2015-04-29 – 2015-04-30 (×3): 15 mg/h via INTRAVENOUS
  Filled 2015-04-29 (×6): qty 100

## 2015-04-29 MED ORDER — SODIUM CHLORIDE 0.9 % IV BOLUS (SEPSIS)
500.0000 mL | Freq: Once | INTRAVENOUS | Status: AC
  Administered 2015-04-29: 500 mL via INTRAVENOUS

## 2015-04-29 NOTE — Progress Notes (Signed)
Pt went into afib in 140's at stress test. Cardiology and Candiss Norse, MD notified. Called [facility] to have them stop and bring patient back to floor.

## 2015-04-29 NOTE — Progress Notes (Signed)
STROKE TEAM PROGRESS NOTE   HISTORY OF PRESENT ILLNESS Patricia [name] is an 75 y.o. female admitted for evaluation of chest pain yesterday 04/27/2015. On admission she stated that her speech had been slurred for 2 days. A new diagnosis of paroxysmal atrial fibrillation was made this admission. Has chads-vasc score of 4. Given the slurred speech and atrial fibrillation, possible stroke was suspected. 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 Shower emboli were suspected based upon the overall MRI appearance. She was started on heparin without bolus and statin was continued. Plan was to switch anticoagulation to Eliquis following cardiac catheterization. Patient was not considered for TPA secondary to delay in arrival. She was admitted for further evaluation and treatment.   SUBJECTIVE (INTERVAL HISTORY) Dr. Percival Spanish is at the bedside. She is scheduled for nuclear stress test tomorrow. 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 She also has left breast cancer s/p radiation and currently on tamoxifen. Her slurry speech much improved.      OBJECTIVE Temp:  [97.2 F (36.2 C)-98.9 F (37.2 C)] 97.2 F (36.2 C) (02/27 0400) Pulse Rate:  [76-119] 119 (02/27 0851) Cardiac Rhythm:  [-] Normal sinus rhythm (02/27 0700) Resp:  [16-35] 35 (02/27 0850) BP: (109-155)/(59-107) 155/107 mmHg (02/27 0822) SpO2:  [93 %-96 %] 94 % (02/27 0903) Weight:  [66.452 kg (146 lb 8 oz)] 66.452 kg (146 lb 8 oz) (02/27 0400)  CBC:  Recent Labs Lab 04/27/15 1140 04/29/15 0510  WBC 8.0 11.1*  HGB 13.9 13.1  HCT 41.3 39.8  MCV 94.9 94.5  PLT 202 742    Basic Metabolic Panel:  Recent Labs Lab 04/28/15 0627 04/29/15 0510  NA 143 140  K 4.2 3.9  CL 107 107  CO2 25 22  GLUCOSE 157* 112*  BUN 22* 23*  CREATININE 1.17* 1.01*  CALCIUM 9.2 8.7*  MG 2.1  --     Lipid Panel:    Component Value Date/Time   CHOL 167 04/29/2015 0510    TRIG 75 04/29/2015 0510   TRIG 52 01/13/2006 1012   HDL 57 04/29/2015 0510   CHOLHDL 2.9 04/29/2015 0510   CHOLHDL 3.0 CALC 01/13/2006 1012   VLDL 15 04/29/2015 0510   LDLCALC 95 04/29/2015 0510   HgbA1c:  Lab Results  Component Value Date   HGBA1C 5.7 08/01/2012   Urine Drug Screen: No results found for: LABOPIA, COCAINSCRNUR, LABBENZ, AMPHETMU, THCU, LABBARB    IMAGING I have personally reviewed the radiological images below and agree with the radiology interpretations.  Dg Chest 2 View 04/29/2015   1. Improving aeration. Persistent patchy opacities at the left lung base.  04/28/2015  Improving interstitial edema pattern. Bibasilar atelectasis with small left effusion.   [name] Contrast 04/28/2015  Multifocal subcentimeter areas of acute infarction affect the RIGHT MCA territory. Shower of emboli is suspected. No hemorrhage or significant mass effect. Chronic changes of atrophy and white matter disease as described. No MR evidence for large vessel occlusion. Cervical spondylosis, most severe at C4-C5.   2D Echocardiogram  - Left ventricle: The cavity size was normal. Systolic function wasnormal. The estimated ejection fraction was in the range of 55%to 60%. Wall motion was normal; there were no regional wallmotion abnormalities. The study is not technically sufficient toallow evaluation of LV diastolic function. Doppler parameters areconsistent with high ventricular filling pressure. - Aortic valve: Transvalvular velocity was within the normal range.There was no stenosis. There was  mild to moderate regurgitation.Regurgitation pressure half-time: 369 ms. - Mitral valve: Transvalvular velocity was within the normal range.There was no evidence for stenosis. There was trivialregurgitation. - Left atrium: The atrium was severely dilated. - Right ventricle: The cavity size was normal. Wall thickness wasnormal. Systolic function was normal. - Atrial septum: No defect or  patent foramen ovale was identifiedby color flow Doppler. - Tricuspid valve: There was mild regurgitation. - Inferior vena cava: The vessel was normal in size. Therespirophasic diameter changes were in the normal range (>= 50%),consistent with normal central venous pressure.  CTA head and neck 1. Stable appearance of posterior right insular cortex infarct. No new infarcts or significant progression are evident. 2. Atherosclerotic changes at the carotid bifurcations bilaterally without significant stenosis. 3. Less than 50% narrowing of the proximal right vertebral artery. 4. Atherosclerotic changes within the cavernous internal carotid arteries bilaterally without focal stenosis.   Physical exam  Temp:  [97.2 F (36.2 C)-98.2 F (36.8 C)] 98.2 F (36.8 C) (02/27 1600) Pulse Rate:  [76-119] 107 (02/27 1600) Resp:  [16-35] 25 (02/27 1028) BP: (107-155)/(49-107) 111/73 mmHg (02/27 1900) SpO2:  [93 %-95 %] 93 % (02/27 1600) Weight:  [146 lb 8 oz (66.452 kg)] 146 lb 8 oz (66.452 kg) (02/27 0400)  General - Well nourished, well developed, in no apparent distress.  Ophthalmologic - Fundi not visualized due to eye movement.  Cardiovascular - irregularly irregular heart rate and rhythm.  Mental Status -  Level of arousal and orientation to time, place, and person were intact. Language including expression, naming, repetition, comprehension was assessed and found intact. Fund of Knowledge was assessed and was intact.  Cranial Nerves II - XII - II - Visual field intact OU. III, IV, VI - Extraocular movements intact. V - Facial sensation intact bilaterally. VII - Facial movement intact bilaterally. VIII - Hearing & vestibular intact bilaterally. X - Palate elevates symmetrically. XI - Chin turning & shoulder shrug intact bilaterally XII - Tongue protrusion intact.  Motor Strength - The patient's strength was normal in all extremities and pronator drift was absent.  Bulk was  normal and fasciculations were absent   Motor Tone - Muscle tone was assessed at the neck and appendages and was normal.  Reflexes - The patient's reflexes were 1+ in all extremities and she had no pathological reflexes  Sensory - Light touch, temperature/pinprick were assessed and were symmetrical.    Coordination - The patient had normal movements in the hands and feet with no ataxia or dysmetria.  Tremor was absent.  Gait and Station - deferred due to safety concerns.   ASSESSMENT/PLAN [name] is a 75 y.o. female with history of lung carcinoid cancer s/p resection 2003, and ER+ breast Ca s/p lumpectomy, XRT and  on Tamoxifen, HTN, hyperlipidemia, hypothyroidism, admitted with chest pain who also reported slurred reach 2 days. She did not receive IV t-PA due to unknown last known well.   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 Plan change to eliquis after stress test.  Ongoing aggressive stroke risk factor management  Therapy recommendations:  Outpatient OT, no PT  Disposition:  Return home  Atrial Fibrillation with RVR  New diagnosis this admission  Home anticoagulation:  No antithrombotic   on IV heparin with plans to change to Eliquis after stress test.   Hypertension  Cardiology adjusting meds  Long-term goal normotensive  Hyperlipidemia  Home meds:  zocor 40   LDL 95, goal < 70  Changed to crestor  Continue statin at discharge  Other Stroke Risk Factors  Advanced age  Former Cigarette smoker, quit smoking 51 years ago   ETOH use  Family hx stroke (mother and father)  Coronary artery  disease  Other Active Problems  Chest pain  Mild asthma exacerbation  Hypothyroidism   GERD  Hx carcinoid lung cancer tumor, removed  Hx breast cancer s/p lumpectomy and XRT followed by tamoxifen   Hospital day # 1  Neurology will sign off. Please call with questions. Pt will follow up with carolyn Hassell Done NP at [facility] in about 1 month. Thanks for the consult.  Rosalin Hawking, MD PhD Stroke Neurology 04/29/2015 9:04 PM     To contact Stroke Continuity provider, please refer to [email or web address] After hours, contact General Neurology

## 2015-04-29 NOTE — Plan of Care (Signed)
Problem: Education: Goal: Knowledge of disease or condition will improve Outcome: Progressing Stroke care plan initiated; MRI positive for several acute right MCA-territory infarcts. Patient still has some difficulty with slight memory deficits Very mild expressive aphasia that was also noted earlier this shift appears to have resolved at this point. Patient was initially admitted with chest pain to rule-out MI and was found to have some degree of heart failure exacerbation; has been chest pain-free since admission. Good urine output with one dose of IV lasix earlier today; still urinating frequently. Cardiac work-up ongoing; patient for myoview in AM. Stroke work-up ongoing; scheduled for carotid ultrasound and follow-up labs in AM. Continuing frequent neuro assessments and NIHSS documentation every shift per orders.  Vital signs have been stable with slight elevation in oral temp this evening (afebrile): Filed Vitals:    04/28/15 1316 04/28/15 1550 04/28/15 1916 04/28/15 2000  BP: 151/85 109/79   123/59  Pulse: 84 76   90  Temp:   98.4 F (36.9 C)   98.9 F (37.2 C)  TempSrc:   Oral      Resp:   16   18  Height:          Weight:          SpO2:   93% 95% 96%    Patient was educated earlier this evening re:  Plans of care (chest pain/CHF/stroke)  Labs/tests/procedures  Carotid doppler  Myoview  AM labs  Medications:  Gabapentin  Tamoxifen  Metoprolol tartrate  IV heparin infusion  Guaifenesin  Lovastatin  Latanoprost  Frequent neuro checks  Activity progression  Safety plan  Initial discharge plan  Patient was primarily concerned with timing/schedule of tests ordered in AM. Explained to patient that she will likely be early on the schedule for myoview, considering that she was cancelled yesterday after mistakenly eating breakfast tray. Also explained that it was not possible to ascertain a time for her carotid doppler study after normal business hours;  that RN on day shift will call to enquire tomorrow during the day. She seemed satisfied with this information.  Patricia [name] is still exhibiting some mild memory deficit, though the aphasia as previously noted appears to be resolved. She is not receiving education very effectively at this time. She frequently changes the subject and it is difficult to steer conversation back to the education plan; needs reinforcement. She will need continued and focused education with family/caregiver present to ensure safety and compliance with medications and plan of care post-discharge.  No acute needs at this time. Continuing to monitor closely.

## 2015-04-29 NOTE — Progress Notes (Signed)
Physical Therapy Evaluation Patient Details Name: Patricia [name] MRN: [id number] DOB: 05/09/1940 Today's Date: 04/29/2015   History of Present Illness  This 75 y.o. female admitted with CP.  MI was ruled out.  Telemetry revealed new onset paroxysmal A-Fib.  On 04/28/15, she developed acute dysphasia.  MRI of brain showed multifocal areas of acute infarct affecting MCA territory.  PMH includes:  h/o lung CA with resection 2003; breast CA s/p radiation 2015, GERD.     Clinical Impression  Pt admitted with above diagnosis. Pt currently with functional limitations due to the deficits listed below (see PT Problem List). [name] presents w/ memory deficits and is impulsive.  Session limited to ambulating short distance in her room as her HR increased to the 150's w/ this.  No instability noted w/ ambulation; however pt reaching out for counter and bed while ambulating in room.  She will have 24/7 assist available from her husband at d/c.  Pt will benefit from skilled PT to increase their independence and safety with mobility to allow discharge to the venue listed below.      Follow Up Recommendations No PT follow up;Supervision for mobility/OOB    Equipment Recommendations  None recommended by PT    Recommendations for Other Services OT consult     Precautions / Restrictions Precautions Precaution Comments: monitor HR  Restrictions Weight Bearing Restrictions: No      Mobility  Bed Mobility Overal bed mobility: Independent                Transfers Overall transfer level: Needs assistance Equipment used: None Transfers: Sit to/from Stand Sit to Stand: Supervision Stand pivot transfers: Supervision       General transfer comment: Supervision for safety and management of IV pole.    Ambulation/Gait Ambulation/Gait assistance: Supervision Ambulation Distance (Feet): 30 Feet Assistive device: None Gait Pattern/deviations: Step-through pattern   Gait velocity  interpretation: at or above normal speed for age/gender General Gait Details: Limited to ambulating in room as pt's HR increased to 150's after ambulating to door.  No instability noted while ambulating in room although pt reaching out for counter and bed while walking by.  Stairs            Wheelchair Mobility    Modified Rankin (Stroke Patients Only) Modified Rankin (Stroke Patients Only) Pre-Morbid Rankin Score: No symptoms Modified Rankin: Moderately severe disability (supervision for safe ambulation)     Balance Overall balance assessment: Needs assistance Sitting-balance support: Feet supported;No upper extremity supported Sitting balance-Leahy Scale: Normal     Standing balance support: No upper extremity supported;During functional activity Standing balance-Leahy Scale: Good Standing balance comment: No instability noted but pt reaching out for counter and bed during dynamic tasks                             Pertinent Vitals/Pain Pain Assessment: No/denies pain    Home Living Family/patient expects to be discharged to:: Private residence Living Arrangements: Spouse/significant other Available Help at Discharge: Family;Available 24 hours/day Type of Home: House Home Access: Stairs to enter Entrance Stairs-Rails: Right Entrance Stairs-Number of Steps: 2   Home Equipment: Shower seat - built in      Prior Function Level of Independence: Independent         Comments: Pt playes golf two days/wk, 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      Hand Dominance   Dominant Hand: Right  Extremity/Trunk Assessment   Upper Extremity Assessment: Defer to OT evaluation           Lower Extremity Assessment: Overall WFL for tasks assessed      Cervical / Trunk Assessment: Normal  Communication   Communication: No difficulties  Cognition Arousal/Alertness: Awake/alert Behavior During Therapy: Impulsive Overall Cognitive Status:  Impaired/Different from baseline Area of Impairment: Attention;Memory;Problem solving   Current Attention Level: Selective Memory: Decreased short-term memory       Problem Solving: Difficulty sequencing;Requires verbal cues General Comments: Pt impulsive and attempting to get OOB w/ IV still in place and turning w/ phone cord wrapped around her.  She did this x3 each after already being told to wait until the therapist could assist.    General Comments      Exercises        Assessment/Plan    PT Assessment Patient needs continued PT services  PT Diagnosis Altered mental status;Difficulty walking   PT Problem List Decreased activity tolerance;Decreased balance;Decreased knowledge of use of DME;Decreased safety awareness;Cardiopulmonary status limiting activity  PT Treatment Interventions DME instruction;Gait training;Stair training;Functional mobility training;Therapeutic activities;Balance training;Therapeutic exercise;Neuromuscular re-education;Cognitive remediation;Patient/family education   PT Goals (Current goals can be found in the Care Plan section) Acute Rehab PT Goals Patient Stated Goal: to return to normal  PT Goal Formulation: With patient Time For Goal Achievement: 05/13/15 Potential to Achieve Goals: Good    Frequency Min 3X/week   Barriers to discharge Inaccessible home environment steps to enter home    Co-evaluation               End of Session Equipment Utilized During Treatment: Gait belt Activity Tolerance: Patient tolerated treatment well Patient left: in chair;with call bell/phone within reach;with chair alarm set Nurse Communication: Mobility status;Other (comment) (HR)    Functional Assessment Tool Used: Clinical Judgement Functional Limitation: Mobility: Walking and moving around Mobility: Walking and Moving Around Current Status [phone]): At least 1 percent but less than 20 percent impaired, limited or restricted Mobility: Walking and  Moving Around Goal Status [phone]): 0 percent impaired, limited or restricted    Time: 6256-3893 PT Time Calculation (min) (ACUTE ONLY): 18 min   Charges:   PT Evaluation $PT Eval Low Complexity: 1 Procedure     PT G Codes:   PT G-Codes **NOT FOR INPATIENT CLASS** Functional Assessment Tool Used: Clinical Judgement Functional Limitation: Mobility: Walking and moving around Mobility: Walking and Moving Around Current Status (T3428): At least 1 percent but less than 20 percent impaired, limited or restricted Mobility: Walking and Moving Around Goal Status [phone]): 0 percent impaired, limited or restricted   Joslyn Hy PT, DPT [phone] Pager: [phone] 04/29/2015, 2:28 PM

## 2015-04-29 NOTE — Progress Notes (Signed)
[facility] for Heparin Indication: chest pain/ACS, new atrial fibrillation and recent stroke  Allergies  Allergen Reactions  . Combigan [Brimonidine Tartrate-Timolol]     Eyes itch, reddened  . Sulfa Antibiotics Rash    Patient Measurements: Height: '4\' 11"'$  (149.9 cm) Weight: 146 lb 8 oz (66.452 kg) IBW/kg (Calculated) : 43.2 Heparin Dosing Weight: 58 kg  Vital Signs: Temp: 97.2 F (36.2 C) (02/27 0400) BP: 155/107 mmHg (02/27 0822) Pulse Rate: 119 (02/27 0851)  Labs:  Recent Labs  04/27/15 1140 04/27/15 1520 04/28/15 0627 04/28/15 0806 04/28/15 1502 04/28/15 2011 04/29/15 0450 04/29/15 0510 04/29/15 1419  HGB 13.9  --   --   --   --   --   --  13.1  --   HCT 41.3  --   --   --   --   --   --  39.8  --   PLT 202  --   --   --   --   --   --  195  --   LABPROT  --  13.8  --   --   --   --   --   --   --   INR  --  1.04  --   --   --   --   --   --   --   HEPARINUNFRC  --   --   --   --   --  0.22* 0.51  --  0.34  CREATININE 0.91  --  1.17*  --   --   --   --  1.01*  --   TROPONINI  --   --   --  <0.03 <0.03 <0.03  --   --   --     Estimated Creatinine Clearance: 40.5 mL/min (by C-G formula based on Cr of 1.01).   Medical History: Past Medical History  Diagnosis Date  . Cough variant asthma   . Osteopenia   . Stricture and stenosis of cervix   . Postmenopausal   . GERD (gastroesophageal reflux disease)   . Disease of pharynx or nasopharynx   . Elevated BP   . Neoplasm   . Personal history of malignant neoplasm of bronchus and lung   . Allergic rhinitis, cause unspecified   . Diverticulosis   . Internal hemorrhoids   . Hiatal hernia   . Hypertension   . Laryngospasm   . Cancer [facility])     lung carcinoid tumor removed 6 year ago  . Hyperlipidemia   . Glaucoma   . Laryngospasm   . Multinodular goiter   . Chronic cough   . 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 Radiation 10/22/14-11/23/14    Left Breast  . CHF (congestive  heart failure) [facility])      Assessment: 75 yo F admitted 2/25 with 1d hx of SSCP. Trop neg and seen by cards who recommended stress test 2/27. Of note, pt also reported increased confusion so MD checked MRI which showed multifocal areas of infarct. Recent stroke but unsure of timing based on patient report of increased confusion for the last week.  Also found to have PAF.  To start low dose heparin.  Ultimately will need oral anticoagulation. Patient did receive Lovenox '40mg'$  SQ 2/25 at [facility].  Heparin level therapeutic at 0.34 on 850 un/hr  Goal of Therapy:  Heparin level 0.3-0.5 units/ml Monitor platelets by anticoagulation protocol: Yes   Plan:  Continue heparin at 850 units/h, check f/u 8 hour confirmatory level  Daily Heparin Level/CBC Monitor s/sx bleeding  Thank you for allowing [name] to participate in this patients care. [name], PharmD Pager: [phone] 04/29/2015 3:53 PM

## 2015-04-29 NOTE — Progress Notes (Signed)
Occupational Therapy Treatment Patient Details Name: Patricia [name] MRN: [id number] DOB: 11-05-1940 Today's Date: 04/29/2015    History of present illness This 75 y.o. female admitted with CP.  MI was rules out.  Telemetry revealed new onset paroxysmal A-Fib.  On 04/28/15, she developed acute dysphasia.  MRI of brain showed multifocal areas of cute infarct affecting MCA territory.  PMH includes:  h/o lung CA with resection 2003; breast CA s/p radiation 2015, GERD.    OT comments  Pt admitted with above. She demonstrates the below listed deficits and will benefit from continued OT to maximize safety and independence with BADLs.  OT eval limited to bedside due to HR to 150 upon standing.  She presents with impaired cognition including decreased attention, memory, and problem solving (all appear mild).   She also has mild lt visual field deficit which she reports is long standing.   She was very active and independent PTA.  Will follow acutely.  At this time, recommend OPOT.      Follow Up Recommendations  Outpatient OT;Other (comment) (depending on progress )    Equipment Recommendations  None recommended by OT    Recommendations for Other Services      Precautions / Restrictions Precautions Precaution Comments: monitor HR        Mobility Bed Mobility Overal bed mobility: Independent                Transfers Overall transfer level: Needs assistance   Transfers: Sit to/from Stand;Stand Pivot Transfers Sit to Stand: Supervision Stand pivot transfers: Supervision            Balance Overall balance assessment: No apparent balance deficits (not formally assessed) (very bried assessment )                                 ADL Overall ADL's : Needs assistance/impaired                                       General ADL Comments: Pt able to perform ADLs with supervision to min guard assist.  Eval limited due to HR elevated to 151 with standing.  No  apparent balance deficits noted with that minimal activity       Vision Eye Alignment: Within Functional Limits     Tracking/Visual Pursuits: Able to track stimulus in all quads without difficulty         Additional Comments: Pt reports she has long standing Lt field deficit due to glaucoma - field testing was inconsitent in Lt lower field.  She reports she frequently has fields tested by ophthalmology    Perception Perception Perception Tested?: Yes   Praxis Praxis Praxis tested?: Within functional limits    Cognition   Behavior During Therapy: [facility] for tasks assessed/performed Overall Cognitive Status: Impaired/Different from baseline Area of Impairment: Attention;Problem solving   Current Attention Level: Selective Memory: Decreased short-term memory        Problem Solving: Difficulty sequencing;Requires verbal cues General Comments: Pt with mod difficulty performing serial subtraction by 7s, and she endorses mild memory deficits     Extremity/Trunk Assessment  Upper Extremity Assessment Upper Extremity Assessment: Overall WFL for tasks assessed   Lower Extremity Assessment Lower Extremity Assessment: Defer to PT evaluation   Cervical / Trunk Assessment Cervical / Trunk Assessment: Normal    Exercises  Shoulder Instructions       General Comments      Pertinent Vitals/ Pain       Pain Assessment: No/denies pain  Home Living Family/patient expects to be discharged to:: Private residence Living Arrangements: Spouse/significant other Available Help at Discharge: Family Type of Home: House             Bathroom Shower/Tub: Occupational psychologist: Standard     Home Equipment: Civil engineer, contracting - built in          Prior Functioning/Environment Level of Independence: Independent        Comments: Pt playes golf two days/wk, 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    Frequency Min 2X/week     Progress Toward  Goals  OT Goals(current goals can now be found in the care plan section)     Acute Rehab OT Goals Patient Stated Goal: to return to normal  OT Goal Formulation: With patient Time For Goal Achievement: 05/13/15 Potential to Achieve Goals: Good ADL Goals Additional ADL Goal #1: Pt will be independent with ADLs Additional ADL Goal #2: Pt will perform path finding with supervision  Additional ADL Goal #3: Pt will perform math related calculations mod I as needed for financial management and work related activities   Plan      Co-evaluation                 End of Session     Activity Tolerance Other (comment) (elevated HR )   Patient Left in bed;with call bell/phone within reach;with bed alarm set   Nurse Communication Mobility status    Functional Limitation: Self care Self Care Current Status (B1694): At least 1 percent but less than 20 percent impaired, limited or restricted Self Care Goal Status (H0388): 0 percent impaired, limited or restricted   Time: 1330-1350 OT Time Calculation (min): 20 min  Charges: OT G-codes **NOT FOR INPATIENT CLASS** Functional Limitation: Self care Self Care Current Status (E2800): At least 1 percent but less than 20 percent impaired, limited or restricted Self Care Goal Status (L4917): 0 percent impaired, limited or restricted OT General Charges $OT Visit: 1 Procedure OT Evaluation $OT Eval Moderate Complexity: 1 Procedure  Ival Pacer M 04/29/2015, 2:03 PM

## 2015-04-29 NOTE — Progress Notes (Signed)
ANTICOAGULATION CONSULT NOTE - Follow Up Consult  Pharmacy Consult for heparin Indication: atrial fibrillation and stroke  Labs:  Recent Labs  04/27/15 1140 04/27/15 1520 04/28/15 0627 04/28/15 0806 04/28/15 1502 04/28/15 2011 04/29/15 0450 04/29/15 0510  HGB 13.9  --   --   --   --   --   --  13.1  HCT 41.3  --   --   --   --   --   --  39.8  PLT 202  --   --   --   --   --   --  195  LABPROT  --  13.8  --   --   --   --   --   --   INR  --  1.04  --   --   --   --   --   --   HEPARINUNFRC  --   --   --   --   --  0.22* 0.51  --   CREATININE 0.91  --  1.17*  --   --   --   --   --   TROPONINI  --   --   --  <0.03 <0.03 <0.03  --   --     Assessment/Plan:  74yo female therapeutic on heparin after rate increase. Will continue gtt at current rate and confirm stable with additional level.   Wynona Neat, PharmD, BCPS  04/29/2015,5:50 AM

## 2015-04-29 NOTE — Progress Notes (Signed)
Daughter states, "pt is finally asleep, she needs sleep. Please don't wake her."

## 2015-04-29 NOTE — Progress Notes (Addendum)
Patient Demographics:    Patricia [name], is a 75 y.o. female, DOB - Oct 08, 1940, AGT:[id number]  Admit date - 04/27/2015   Admitting Physician Reyne Dumas, MD  Outpatient Primary MD for the patient is Annye Asa, MD  LOS -    Chief Complaint  Patient presents with  . Shortness of Breath  . Chest Pain        Subjective:    Patricia [name] today has, No headache, No chest pain, No abdominal pain - No Nausea, No new weakness tingling or numbness, No Cough - SOB. Slight problems with her speech.   Assessment  & Plan :     1. Chest pain. Ruled out for MI, cardiology on board, she is now pain-free, on beta blocker and statin, due for stress test on 04/29/2015.  2. Paroxysmal atrial fibrillation diagnosed this admission. [place] vasc 2 score of 4 above. Discussed with both cardiology and neurology, for now heparin drip without bolus, full stroke protocol initiated. Neurology consulted. Continue statin. Placed on as needed IV Cardizem and Lopressor for rate control. Cardiology following.  3. 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 neuro consulted, stroke protocol initiated, Heparin without bolus, after Stress test she will be switched to Eliquis, continue statin. LDL was above goal and she has been switched to Crestor, A1c was 5.7. CT angiogram head and neck pending. Echo stable.  4. Mild asthma exacerbation upon admission. Resolved. Stop steroids and monitor with supportive care.  5. Hypothyroidism. Continue supplementation.  6. Dyslipidemia. On statin continue. Repeat lipid panel pending.  7. GERD. On PPI continue.  8. Essential hypertension. Continue present regimen.  9. History of carcinoid lung cancer and breast cancer. Continue follow-up with Dr. [name]. Repeat 2  view chest x-ray in 7-10 days.    Code Status : Full  Family Communication  : None  Disposition Plan  : Home 1-2 days  Consults  :  Cards, Neuro  Procedures  :   MRI brain. Right MCA territory embolic infarcts.  Echogram   Left ventricle: The cavity size was normal. Systolic function wasnormal. The estimated ejection fraction was in the range of 55%to 60%. Wall motion was normal; there were no regional wallmotion abnormalities. The study is not technically sufficient toallow evaluation of LV diastolic function. Doppler parameters areconsistent with high ventricular filling pressure. - Aortic valve: Transvalvular velocity was within the normal range.There was no stenosis. There was mild to moderate regurgitation.Regurgitation pressure half-time: 369 ms. - Mitral valve: Transvalvular velocity was within the normal range.There was no evidence for stenosis. There was trivialregurgitation. - Left atrium: The atrium was severely dilated. - Right ventricle: The cavity size was normal. Wall thickness wasnormal. Systolic function was normal. - Atrial septum: No defect or patent foramen ovale was identifiedby color flow Doppler. - Tricuspid valve: There was mild regurgitation. - Inferior vena cava: The vessel was normal in size. Therespirophasic diameter changes were in the normal range (>= 50%),consistent with normal central venous pressure  CT angiogram head and neck  Stress test 04/29/2015  DVT Prophylaxis  :    Heparin gtt  Lab Results  Component Value Date   PLT 195 04/29/2015    Inpatient Medications  Scheduled Meds: . gabapentin  400 mg  Oral BID  . guaiFENesin  600 mg Oral BID  . latanoprost  1 drop Both Eyes QHS  . metoprolol tartrate  25 mg Oral BID  . mometasone-formoterol  2 puff Inhalation BID  . pantoprazole  40 mg Oral Daily  . simvastatin  40 mg Oral QHS  . sodium chloride  500 mL Intravenous Once  . tamoxifen  20 mg Oral BID  . thyroid  30 mg Oral  QAC breakfast   Continuous Infusions: . heparin 850 Units/hr (04/28/15 2101)   PRN Meds:.acetaminophen, diltiazem, guaiFENesin-dextromethorphan, levalbuterol, metoprolol, ondansetron (ZOFRAN) IV  Antibiotics  :     Anti-infectives    None        Objective:   Filed Vitals:   04/29/15 0822 04/29/15 0850 04/29/15 0851 04/29/15 0903  BP: 155/107     Pulse:   119   Temp:      TempSrc:      Resp:  35    Height:      Weight:      SpO2:    94%    Wt Readings from Last 3 Encounters:  04/29/15 66.452 kg (146 lb 8 oz)  02/14/15 65.998 kg (145 lb 8 oz)  12/20/14 65.908 kg (145 lb 4.8 oz)     Intake/Output Summary (Last 24 hours) at 04/29/15 0936 Last data filed at 04/29/15 0659  Gross per 24 hour  Intake 875.47 ml  Output   1150 ml  Net -274.53 ml     Physical Exam  Awake Alert, Oriented X 3, No new F.N deficits, Mild dysarthria, Normal affect [place].AT,PERRAL Supple Neck,No JVD, No cervical lymphadenopathy appriciated.  Symmetrical Chest wall movement, Good air movement bilaterally, CTAB RRR,No Gallops,Rubs or new Murmurs, No Parasternal Heave +ve B.Sounds, Abd Soft, No tenderness, No organomegaly appriciated, No rebound - guarding or rigidity. No Cyanosis, Clubbing or edema, No new Rash or bruise      Data Review:   Micro Results No results found for this or any previous visit (from the past 240 hour(s)).  Radiology Reports Dg Chest 2 View  04/28/2015  CLINICAL DATA:  Shortness of breath today EXAM: CHEST  2 VIEW COMPARISON:  04/27/2015 FINDINGS: Bibasilar atelectasis. Small left pleural effusion. Heart is borderline in size. Postoperative changes in the left breast/axilla and left hilum. Improving interstitial prominence. IMPRESSION: Improving interstitial edema pattern. Bibasilar atelectasis with small left effusion. Electronically Signed   By: Patricia [name] M.D.   On: 04/28/2015 07:25   Dg Chest 2 View  04/27/2015  CLINICAL DATA:  75 year old current history of  asthma presenting with acute onset of shortness of breath and chest heaviness which began yesterday. Personal history of left upper lobe lung cancer post resection in 2007 and personal history of left breast cancer post lumpectomy in 2016. EXAM: CHEST  2 VIEW COMPARISON:  12/18/2011 and earlier. FINDINGS: Cardiac silhouette upper normal in size to slightly enlarged, unchanged. Thoracic aorta mildly atherosclerotic and tortuous, unchanged. Hilar and mediastinal contours otherwise unremarkable. Interstitial opacities throughout both lungs associated with small bilateral pleural effusions. Mild atelectasis in the lower lobes. Postsurgical changes in the lingula and left hilum. Surgical clips in the left breast and left axilla prior lumpectomy and node dissection. Degenerative changes throughout the thoracic and lumbar spine with thoracolumbar scoliosis convex left. IMPRESSION: Mild diffuse interstitial pulmonary edema and small bilateral pleural effusions likely indicating mild CHF. However, lymphangitic carcinomatosis can have a similar appearance. Electronically Signed   By: Evangeline Dakin M.D.   On: 04/27/2015  11:15   [name] Contrast  04/28/2015  CLINICAL DATA:  Chest pain.  Confusion.  Breast cancer. EXAM: MRI HEAD WITHOUT CONTRAST TECHNIQUE: Multiplanar, multiecho pulse sequences of the brain and surrounding structures were obtained without intravenous contrast. COMPARISON:  MR brain 05/16/2004. FINDINGS: Small foci of restricted diffusion are seen in the RIGHT parietal cortex, RIGHT insula, and RIGHT temporal operculum, consistent with multifocal areas of acute infarction. Although they are clustered within the same vascular distribution (RIGHT ICA/MCA), shower of emboli is suggested as the distribution is not typical of a watershed insult. No other areas infarction are seen. No hemorrhage, mass lesion, hydrocephalus, or extra-axial fluid. Moderate atrophy. T2 and FLAIR hyperintensity throughout the  periventricular and subcortical white matter, likely chronic microvascular ischemic change. LEFT parietal subcortical infarct was present 2006. Flow voids are maintained throughout the carotid, basilar, and vertebral arteries. There are no areas of chronic hemorrhage. No intracranial large vessel occlusion. Normal pituitary and cerebellar tonsils. Advanced cervical spondylosis at C2-3, C3-4, and C4-5. Cord flattening may be present at the C4-5 level, similar to priors. There is no sinus disease. Negative orbits. 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 IMPRESSION: Multifocal subcentimeter areas of acute infarction affect the RIGHT MCA territory. Shower of emboli is suspected. No hemorrhage or significant mass effect. Chronic changes of atrophy and white matter disease as described. No MR evidence for large vessel occlusion. Cervical spondylosis, most severe at C4-C5. Electronically Signed   By: Staci Righter M.D.   On: 04/28/2015 10:33     CBC  Recent Labs Lab 04/27/15 1140 04/29/15 0510  WBC 8.0 11.1*  HGB 13.9 13.1  HCT 41.3 39.8  PLT 202 195  MCV 94.9 94.5  MCH 32.0 31.1  MCHC 33.7 32.9  RDW 12.8 13.0    Chemistries   Recent Labs Lab 04/27/15 1140 04/28/15 0627 04/29/15 0510  NA 140 143 140  K 4.2 4.2 3.9  CL 110 107 107  CO2 21* 25 22  GLUCOSE 92 157* 112*  BUN 18 22* 23*  CREATININE 0.91 1.17* 1.01*  CALCIUM 8.7* 9.2 8.7*  MG  --  2.1  --    ------------------------------------------------------------------------------------------------------------------  Recent Labs  04/29/15 0510  CHOL 167  HDL 57  LDLCALC 95  TRIG 75  CHOLHDL 2.9    Lab Results  Component Value Date   HGBA1C 5.7 08/01/2012   ------------------------------------------------------------------------------------------------------------------  Recent Labs  04/27/15 1520  TSH 2.802    ------------------------------------------------------------------------------------------------------------------ No results for input(s): VITAMINB12, FOLATE, FERRITIN, TIBC, IRON, RETICCTPCT in the last 72 hours.  Coagulation profile  Recent Labs Lab 04/27/15 1520  INR 1.04     Recent Labs  04/27/15 1520  DDIMER 0.44    Cardiac Enzymes  Recent Labs Lab 04/28/15 0806 04/28/15 1502 04/28/15 2011  TROPONINI <0.03 <0.03 <0.03   ------------------------------------------------------------------------------------------------------------------    Component Value Date/Time   BNP 404.7* 04/27/2015 1140    Time Spent in minutes  30   Josyah Achor K M.D on 04/29/2015 at 9:36 AM  Between 7am to 7pm - Pager - [phone]  After 7pm go to [email or web address] - password [facility]  [facility] -  Office  [phone]

## 2015-04-29 NOTE — Progress Notes (Signed)
Patient Profile: 75 yo female with a history of lung cancer with resection in 2003, breast cancer s/p radiation therapy last year, GERD and HLD.No prior Cardiac history. She presented 04/27/15 withChest pain and ruled out for MI with negative enzymes. Telemetry showed new onset paroxsymal atrial fibrillation. 04/28/15 she developed acute dysphasia. Brain MRI shows multifocal areas of acute infarction affecting right MCA territory. EF 55-60% by echo. Scheduled for nuclear stress test  today to assess for ischemia, however study canceled as patient went into atrial fibrillation w/ RVR.   Subjective: No complaints. She is fairly asymptomatic with her arrhthymia. No chest pain or dyspnea. Speech is back to normal. She denies any other neurological deficits.   Objective: Vital signs in last 24 hours: Temp:  [97.2 F (36.2 C)-98.9 F (37.2 C)] 97.2 F (36.2 C) (02/27 0400) Pulse Rate:  [76-90] 76 (02/27 0400) Resp:  [16-22] 22 (02/27 0400) BP: (109-151)/(59-85) 151/66 mmHg (02/27 0400) SpO2:  [93 %-96 %] 95 % (02/27 0400) Weight:  [146 lb 8 oz (66.452 kg)] 146 lb 8 oz (66.452 kg) (02/27 0400) Last BM Date: 04/27/15  Intake/Output from previous day: 02/26 0701 - 02/27 0700 In: 995.5 [P.O.:720; I.V.:155.5] Out: 1150 [Urine:1150] Intake/Output this shift:    Medications Current Facility-Administered Medications  Medication Dose Route Frequency Provider Last Rate Last Dose  . acetaminophen (TYLENOL) tablet 650 mg  650 mg Oral Q4H PRN Rondel Jumbo, PA-C   650 mg at 04/27/15 1835  . diltiazem (CARDIZEM) injection 10 mg  10 mg Intravenous Q6H PRN Thurnell Lose, MD      . gabapentin (NEURONTIN) capsule 400 mg  400 mg Oral BID Rondel Jumbo, PA-C   400 mg at 04/28/15 2116  . guaiFENesin (MUCINEX) 12 hr tablet 600 mg  600 mg Oral BID Rondel Jumbo, PA-C   600 mg at 04/28/15 2116  . guaiFENesin-dextromethorphan (ROBITUSSIN DM) 100-10 MG/5ML syrup 5 mL  5 mL Oral Q4H PRN Rondel Jumbo,  PA-C      . heparin ADULT infusion 100 units/mL (25000 units/250 mL)  850 Units/hr Intravenous Continuous Romona Curls, RPH 8.5 mL/hr at 04/28/15 2101 850 Units/hr at 04/28/15 2101  . latanoprost (XALATAN) 0.005 % ophthalmic solution 1 drop  1 drop Both Eyes QHS Rondel Jumbo, PA-C   1 drop at 04/28/15 2116  . levalbuterol (XOPENEX) nebulizer solution 1.25 mg  1.25 mg Nebulization Q6H PRN Rondel Jumbo, PA-C      . metoprolol (LOPRESSOR) injection 5 mg  5 mg Intravenous Q4H PRN Thurnell Lose, MD      . metoprolol tartrate (LOPRESSOR) tablet 12.5 mg  12.5 mg Oral BID Brett Canales, PA-C   12.5 mg at 04/28/15 2116  . mometasone-formoterol (DULERA) 100-5 MCG/ACT inhaler 2 puff  2 puff Inhalation BID Rondel Jumbo, PA-C   2 puff at 04/28/15 1914  . ondansetron (ZOFRAN) injection 4 mg  4 mg Intravenous Q6H PRN Rondel Jumbo, PA-C      . pantoprazole (PROTONIX) EC tablet 40 mg  40 mg Oral Daily Rondel Jumbo, PA-C   40 mg at 04/28/15 0804  . simvastatin (ZOCOR) tablet 40 mg  40 mg Oral QHS Rondel Jumbo, PA-C   40 mg at 04/28/15 2116  . tamoxifen (NOLVADEX) tablet 20 mg  20 mg Oral BID Reyne Dumas, MD   20 mg at 04/28/15 2116  . thyroid (ARMOUR) tablet 30 mg  30 mg Oral QAC breakfast Clarise Cruz  E Wertman, PA-C   30 mg at 04/28/15 0805    PE: General appearance: alert, cooperative and no distress Neck: no carotid bruit and no JVD Lungs: clear to auscultation bilaterally Heart: irregularly irregular rhythm and tachy rate Extremities: no LEE Pulses: 2+ and symmetric Skin: warm and dry Neurologic: Grossly normal  Lab Results:   Recent Labs  04/27/15 1140 04/29/15 0510  WBC 8.0 11.1*  HGB 13.9 13.1  HCT 41.3 39.8  PLT 202 195   BMET  Recent Labs  04/27/15 1140 04/28/15 0627 04/29/15 0510  NA 140 143 140  K 4.2 4.2 3.9  CL 110 107 107  CO2 21* 25 22  GLUCOSE 92 157* 112*  BUN 18 22* 23*  CREATININE 0.91 1.17* 1.01*  CALCIUM 8.7* 9.2 8.7*   PT/INR  Recent Labs   04/27/15 1520  LABPROT 13.8  INR 1.04   Cholesterol  Recent Labs  04/29/15 0510  CHOL 167   Cardiac Panel (last 3 results)  Recent Labs  04/28/15 0806 04/28/15 1502 04/28/15 2011  TROPONINI <0.03 <0.03 <0.03    Studies/Results: 2D echo 04/28/15 Study Conclusions   - Left ventricle: The cavity size was normal. Systolic function was normal. The estimated ejection fraction was in the range of 55% to 60%. Wall motion was normal; there were no regional wall motion abnormalities. The study is not technically sufficient to allow evaluation of LV diastolic function. Doppler parameters are consistent with high ventricular filling pressure. - Aortic valve: Transvalvular velocity was within the normal range. There was no stenosis. There was mild to moderate regurgitation. Regurgitation pressure half-time: 369 ms. - Mitral valve: Transvalvular velocity was within the normal range. There was no evidence for stenosis. There was trivial regurgitation. - Left atrium: The atrium was severely dilated. - Right ventricle: The cavity size was normal. Wall thickness was normal. Systolic function was normal. - Atrial septum: No defect or patent foramen ovale was identified by color flow Doppler. - Tricuspid valve: There was mild regurgitation. - Inferior vena cava: The vessel was normal in size. The respirophasic diameter changes were in the normal range (>= 50%), consistent with normal central venous pressure.  Brain MRI 04/28/15  IMPRESSION: Multifocal subcentimeter areas of acute infarction affect the RIGHT MCA territory. Shower of emboli is suspected. No hemorrhage or significant mass effect.  Chronic changes of atrophy and white matter disease as described.  No MR evidence for large vessel occlusion.  Cervical spondylosis, most severe at C4-C5. Assessment/Plan    Principal Problem:   Chest pain Active Problems:   HYPERTENSION, BENIGN ESSENTIAL    GERD   CHEST PAIN   Hyperthyroidism   Pulmonary edema   Asthma   Paroxysmal atrial fibrillation (HCC)   CVA (cerebral infarction)   1. Atrial Fibrillation w/ RVR: new onset. Discovered this admission. TSH, Mg and K all WNL. Rate is currently poorly controlled in the 130s. BP is stable and patient is fairly asymptomatic. IV Lopressor given by IM earlier. We will increase PO metoprolol to 25 mg BID. May need to add PO Cardizem if additional rate control is needed. EF is normal. Continue IV heparin for now. Will hold stress test until rate improves.   2. CVA: in the setting of atrial fibrillation. Multifocal subcentimeter areas of acute infarction affect the RIGHT MCA territory. Shower of emboli is suspected. Neurological deficits have resolved. Given Afib as likely cause, she will need long term oral anticoagulation, however ontinue IV heparin for now. Plan is to see if  patient may potentially need LHC to rule out underlying CAD prior to committing to oral anticoagulation.   3. HTN: moderately elevated. Will increase metoprolol to 25 mg BID for BP and for better rate control.   Brittainy M. Rosita Fire, PA-C 04/29/2015 8:21 AM  History and all data above reviewed.  Patient examined.  I agree with the findings as above.  The Lexiscan Myoview was scheduled for today but she went into atrial fib with RVR.    The patient exam reveals [id number]  ,  Lungs: Clear  ,  Abd: Positive bowel sounds, no rebound no guarding, Ext No edema  .  All available labs, radiology testing, previous records reviewed. Agree with documented assessment and plan. Atrial fib:  Eliquis after Lexiscan Myoview.     Chest pain:  For Lexiscan Myoview. In am.    Minus Breeding  10:23 AM  04/29/2015

## 2015-04-29 NOTE — Progress Notes (Signed)
At 06:22, patient had short run of atrial fibrillation with rapid ventricular response with rate in 140s. She sustained atrial fibrillation for several minutes with rates 90-110 on telemetry. Stated she had felt "a little funny," but did not qualify this further; was OOB at sink brushing hair and teeth with no overt signs of problems.  Escorted back to bed and obtained 12-lead ECG at 06:32, which did not capture rhythm change. Telemetry currently shows sinus rhythm with wandering atrial pacemaker.  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  She is receiving IV heparin at 850 units/hr without complications.  Noted for MD review and follow-up as necessary.

## 2015-04-29 NOTE — Care Management Obs Status (Signed)
[place] NOTIFICATION   Patient Details  Name: Patricia [name] MRN: [id number] Date of Birth: 1941-01-27   Medicare Observation Status Notification Given:  Yes    Bethena Roys, RN 04/29/2015, 3:49 PM

## 2015-04-30 DIAGNOSIS — Z923 Personal history of irradiation: Secondary | ICD-10-CM | POA: Diagnosis not present

## 2015-04-30 DIAGNOSIS — R4702 Dysphasia: Secondary | ICD-10-CM | POA: Diagnosis not present

## 2015-04-30 DIAGNOSIS — E785 Hyperlipidemia, unspecified: Secondary | ICD-10-CM | POA: Diagnosis present

## 2015-04-30 DIAGNOSIS — I480 Paroxysmal atrial fibrillation: Secondary | ICD-10-CM | POA: Diagnosis not present

## 2015-04-30 DIAGNOSIS — I10 Essential (primary) hypertension: Secondary | ICD-10-CM | POA: Diagnosis not present

## 2015-04-30 DIAGNOSIS — Z85118 Personal history of other malignant neoplasm of bronchus and lung: Secondary | ICD-10-CM | POA: Diagnosis not present

## 2015-04-30 DIAGNOSIS — I082 Rheumatic disorders of both aortic and tricuspid valves: Secondary | ICD-10-CM | POA: Diagnosis present

## 2015-04-30 DIAGNOSIS — E0590 Thyrotoxicosis, unspecified without thyrotoxic crisis or storm: Secondary | ICD-10-CM | POA: Diagnosis not present

## 2015-04-30 DIAGNOSIS — Z66 Do not resuscitate: Secondary | ICD-10-CM | POA: Diagnosis present

## 2015-04-30 DIAGNOSIS — R0602 Shortness of breath: Secondary | ICD-10-CM | POA: Diagnosis not present

## 2015-04-30 DIAGNOSIS — M47812 Spondylosis without myelopathy or radiculopathy, cervical region: Secondary | ICD-10-CM | POA: Diagnosis present

## 2015-04-30 DIAGNOSIS — H409 Unspecified glaucoma: Secondary | ICD-10-CM | POA: Diagnosis present

## 2015-04-30 DIAGNOSIS — K219 Gastro-esophageal reflux disease without esophagitis: Secondary | ICD-10-CM | POA: Diagnosis present

## 2015-04-30 DIAGNOSIS — J45901 Unspecified asthma with (acute) exacerbation: Secondary | ICD-10-CM | POA: Diagnosis not present

## 2015-04-30 DIAGNOSIS — Z853 Personal history of malignant neoplasm of breast: Secondary | ICD-10-CM | POA: Diagnosis not present

## 2015-04-30 DIAGNOSIS — I639 Cerebral infarction, unspecified: Secondary | ICD-10-CM | POA: Diagnosis not present

## 2015-04-30 DIAGNOSIS — M8580 Other specified disorders of bone density and structure, unspecified site: Secondary | ICD-10-CM | POA: Diagnosis present

## 2015-04-30 DIAGNOSIS — R471 Dysarthria and anarthria: Secondary | ICD-10-CM | POA: Diagnosis not present

## 2015-04-30 DIAGNOSIS — Z87891 Personal history of nicotine dependence: Secondary | ICD-10-CM | POA: Diagnosis not present

## 2015-04-30 DIAGNOSIS — R079 Chest pain, unspecified: Secondary | ICD-10-CM | POA: Diagnosis not present

## 2015-04-30 DIAGNOSIS — I63411 Cerebral infarction due to embolism of right middle cerebral artery: Secondary | ICD-10-CM | POA: Diagnosis not present

## 2015-04-30 DIAGNOSIS — I481 Persistent atrial fibrillation: Secondary | ICD-10-CM | POA: Diagnosis not present

## 2015-04-30 DIAGNOSIS — Z79810 Long term (current) use of selective estrogen receptor modulators (SERMs): Secondary | ICD-10-CM | POA: Diagnosis not present

## 2015-04-30 DIAGNOSIS — E039 Hypothyroidism, unspecified: Secondary | ICD-10-CM | POA: Diagnosis present

## 2015-04-30 DIAGNOSIS — Z7982 Long term (current) use of aspirin: Secondary | ICD-10-CM | POA: Diagnosis not present

## 2015-04-30 DIAGNOSIS — J385 Laryngeal spasm: Secondary | ICD-10-CM | POA: Diagnosis not present

## 2015-04-30 LAB — BASIC METABOLIC PANEL
Anion gap: 13 (ref 5–15)
BUN: 19 mg/dL (ref 6–20)
CALCIUM: 9 mg/dL (ref 8.9–10.3)
CO2: 24 mmol/L (ref 22–32)
Chloride: 104 mmol/L (ref 101–111)
Creatinine, Ser: 1.1 mg/dL — ABNORMAL HIGH (ref 0.44–1.00)
GFR calc Af Amer: 56 mL/min — ABNORMAL LOW (ref >60)
GFR, EST NON AFRICAN AMERICAN: 48 mL/min — AB (ref >60)
GLUCOSE: 127 mg/dL — AB (ref 65–99)
Potassium: 3.4 mmol/L — ABNORMAL LOW (ref 3.5–5.1)
Sodium: 141 mmol/L (ref 135–145)

## 2015-04-30 LAB — NM MYOCAR MULTI W/SPECT W/WALL MOTION / EF
Estimated workload: 1 METS
Exercise duration (min): 7 min
Exercise duration (sec): 27 s
Peak HR: 110 {beats}/min
Rest HR: 83 {beats}/min

## 2015-04-30 LAB — CBC
HEMATOCRIT: 44.5 % (ref 36.0–46.0)
HEMOGLOBIN: 14.7 g/dL (ref 12.0–15.0)
MCH: 31.6 pg (ref 26.0–34.0)
MCHC: 33 g/dL (ref 30.0–36.0)
MCV: 95.7 fL (ref 78.0–100.0)
Platelets: 265 10*3/uL (ref 150–400)
RBC: 4.65 MIL/uL (ref 3.87–5.11)
RDW: 13.2 % (ref 11.5–15.5)
WBC: 9.9 10*3/uL (ref 4.0–10.5)

## 2015-04-30 LAB — HEPARIN LEVEL (UNFRACTIONATED): HEPARIN UNFRACTIONATED: 0.71 [IU]/mL — AB (ref 0.30–0.70)

## 2015-04-30 LAB — HEMOGLOBIN A1C
Hgb A1c MFr Bld: 5.7 % — ABNORMAL HIGH (ref 4.8–5.6)
Mean Plasma Glucose: 117 mg/dL

## 2015-04-30 MED ORDER — POTASSIUM CHLORIDE CRYS ER 20 MEQ PO TBCR
40.0000 meq | EXTENDED_RELEASE_TABLET | ORAL | Status: DC
  Administered 2015-04-30: 40 meq via ORAL
  Filled 2015-04-30: qty 2

## 2015-04-30 MED ORDER — TECHNETIUM TC 99M SESTAMIBI GENERIC - CARDIOLITE
30.0000 | Freq: Once | INTRAVENOUS | Status: AC | PRN
  Administered 2015-04-30: 30 via INTRAVENOUS

## 2015-04-30 MED ORDER — POTASSIUM CHLORIDE CRYS ER 20 MEQ PO TBCR
40.0000 meq | EXTENDED_RELEASE_TABLET | Freq: Once | ORAL | Status: AC
  Administered 2015-04-30: 40 meq via ORAL
  Filled 2015-04-30: qty 2

## 2015-04-30 MED ORDER — REGADENOSON 0.4 MG/5ML IV SOLN
0.4000 mg | Freq: Once | INTRAVENOUS | Status: DC
  Filled 2015-04-30: qty 5

## 2015-04-30 MED ORDER — TAMOXIFEN CITRATE 10 MG PO TABS
20.0000 mg | ORAL_TABLET | Freq: Every day | ORAL | Status: DC
  Administered 2015-04-30 – 2015-05-01 (×2): 20 mg via ORAL
  Filled 2015-04-30 (×2): qty 2

## 2015-04-30 MED ORDER — APIXABAN 5 MG PO TABS
5.0000 mg | ORAL_TABLET | Freq: Two times a day (BID) | ORAL | Status: DC
  Administered 2015-04-30 – 2015-05-02 (×5): 5 mg via ORAL
  Filled 2015-04-30 (×5): qty 1

## 2015-04-30 MED ORDER — MAGNESIUM HYDROXIDE 400 MG/5ML PO SUSP
5.0000 mL | Freq: Every day | ORAL | Status: DC | PRN
  Filled 2015-04-30: qty 30

## 2015-04-30 MED ORDER — REGADENOSON 0.4 MG/5ML IV SOLN
INTRAVENOUS | Status: AC
  Filled 2015-04-30: qty 5

## 2015-04-30 NOTE — Progress Notes (Signed)
Occupational Therapy Treatment Patient Details Name: Patricia [name] MRN: [id number] DOB: Apr 03, 1940 Today's Date: 04/30/2015    History of present illness This 75 y.o. female admitted with CP.  MI was ruled out.  Telemetry revealed new onset paroxysmal A-Fib.  On 04/28/15, she developed acute dysphasia.  MRI of brain showed multifocal areas of acute infarct affecting MCA territory.  PMH includes:  h/o lung CA with resection 2003; breast CA s/p radiation 2015, GERD.    OT comments  Pt educated on [place] results ( 27 out 61) and how this relates to everyday task. Husband present and also verbalizes understanding of executive cogntive deficits.    Follow Up Recommendations  Outpatient OT;Other (comment)    Equipment Recommendations  None recommended by OT    Recommendations for Other Services      Precautions / Restrictions Precautions Precaution Comments: monitor HR  Restrictions Weight Bearing Restrictions: No       Mobility Bed Mobility Overal bed mobility: Independent                Transfers Overall transfer level: Modified independent Equipment used: None Transfers: Sit to/from Stand Sit to Stand: Modified independent (Device/Increase time)         General transfer comment: No cues or assist needed    Balance Overall balance assessment: Modified Independent Sitting-balance support: Feet supported;No upper extremity supported Sitting balance-Leahy Scale: Normal     Standing balance support: No upper extremity supported;During functional activity Standing balance-Leahy Scale: Good                     ADL                                         General ADL Comments: pt supine with cardizem drip going and kept to bed level for IV running at this time. Pt passed MOCA and demonstrates hand stitch. Pt and husband encouraged to return to work slowly with very simple task.       Vision                     Perception      Praxis      Cognition   Behavior During Therapy: [facility] for tasks assessed/performed Overall Cognitive Status: Impaired/Different from baseline Area of Impairment: Attention   Current Attention Level: Alternating            General Comments: Pt completed moca exam- pt demonstrates visuospaital deficits with omission of "12" when making a clock and inability to count backward from 100 once total of 14 had been subtracted . 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 pt demonstrates ability to thread string through various hole punches to simulate hand stitch.     Extremity/Trunk Assessment               Exercises     Shoulder Instructions       General Comments      Pertinent Vitals/ Pain       Pain Assessment: No/denies pain  Home Living                                          Prior Functioning/Environment  Frequency Min 2X/week     Progress Toward Goals  OT Goals(current goals can now be found in the care plan section)  Progress towards OT goals: Progressing toward goals  Acute Rehab OT Goals Patient Stated Goal: to go home tomorrow OT Goal Formulation: With patient Time For Goal Achievement: 05/13/15 Potential to Achieve Goals: Good ADL Goals Additional ADL Goal #1: Pt will be independent with ADLs Additional ADL Goal #2: Pt will perform path finding with supervision  Additional ADL Goal #3: Pt will perform math related calculations mod I as needed for financial management and work related activities   Plan Discharge plan remains appropriate    Co-evaluation                 End of Session     Activity Tolerance Patient tolerated treatment well   Patient Left in bed;with call bell/phone within reach;with nursing/sitter in room;with family/visitor present   Nurse Communication Mobility status;Precautions        Time: 5520-8022 OT Time Calculation (min): 56 min  Charges: OT General  Charges $OT Visit: 1 Procedure OT Treatments $Cognitive Skills Development: 53-67 mins  Parke Poisson B 04/30/2015, 3:40 PM   Patricia [name]   OTR/L Pager: [phone] Office: [phone] .

## 2015-04-30 NOTE — Progress Notes (Signed)
Patient Name: Patricia [name] Date of Encounter: 04/30/2015   SUBJECTIVE  Seen in nuc med. Feeling well. No chest pain, sob or palpitations.   CURRENT MEDS . gabapentin  400 mg Oral BID  . guaiFENesin  600 mg Oral BID  . latanoprost  1 drop Both Eyes QHS  . metoprolol tartrate  25 mg Oral BID  . mometasone-formoterol  2 puff Inhalation BID  . pantoprazole  40 mg Oral Daily  . potassium chloride  40 mEq Oral Q4H  . regadenoson      . regadenoson  0.4 mg Intravenous Once  . rosuvastatin  40 mg Oral QHS  . tamoxifen  20 mg Oral QHS  . thyroid  30 mg Oral QAC breakfast    OBJECTIVE  Filed Vitals:   04/30/15 1111 04/30/15 1113 04/30/15 1115 04/30/15 1117  BP: 137/46 91/56 103/62 91/64  Pulse: 90 97 102 100  Temp:      TempSrc:      Resp:      Height:      Weight:      SpO2:        Intake/Output Summary (Last 24 hours) at 04/30/15 1118 Last data filed at 04/30/15 0816  Gross per 24 hour  Intake 1629.35 ml  Output   3600 ml  Net -1970.65 ml   Filed Weights   04/28/15 0501 04/29/15 0400 04/30/15 0523  Weight: 146 lb 4.8 oz (66.361 kg) 146 lb 8 oz (66.452 kg) 145 lb 1.6 oz (65.817 kg)    PHYSICAL EXAM  General: Pleasant, NAD. Neuro: Alert and oriented X 3. Moves all extremities spontaneously. Psych: Normal affect. HEENT:  Normal  Neck: Supple without bruits or JVD. Lungs:  Resp regular and unlabored, CTA. Heart: RRR no s3, s4, or murmurs. Abdomen: Soft, non-tender, non-distended, BS + x 4.  Extremities: No clubbing, cyanosis or edema. DP/PT/Radials 2+ and equal bilaterally.  Accessory Clinical Findings  CBC  Recent Labs  04/29/15 0510 04/30/15 0610  WBC 11.1* 9.9  HGB 13.1 14.7  HCT 39.8 44.5  MCV 94.5 95.7  PLT 195 694   Basic Metabolic Panel  Recent Labs  04/28/15 0627 04/29/15 0510 04/30/15 0610  NA 143 140 141  K 4.2 3.9 3.4*  CL 107 107 104  CO2 '25 22 24  '$ GLUCOSE 157* 112* 127*  BUN 22* 23* 19  CREATININE 1.17* 1.01* 1.10*  CALCIUM  9.2 8.7* 9.0  MG 2.1  --   --    Cardiac Enzymes  Recent Labs  04/28/15 0806 04/28/15 1502 04/28/15 2011  TROPONINI <0.03 <0.03 <0.03   BNP Invalid input(s): POCBNP D-Dimer  Recent Labs  04/27/15 1520  DDIMER 0.44   Hemoglobin A1C  Recent Labs  04/29/15 0510  HGBA1C 5.7*   Fasting Lipid Panel  Recent Labs  04/29/15 0510  CHOL 167  HDL 57  LDLCALC 95  TRIG 75  CHOLHDL 2.9   Thyroid Function Tests  Recent Labs  04/27/15 1520  TSH 2.802    TELE  Unable to review as patient seen in nuc med.   Radiology/Studies  Ct Angio Head W/cm &/or Wo Cm  04/29/2015  CLINICAL DATA:  Multi focal areas of acute nonhemorrhagic right MCA territory infarct. EXAM: CT ANGIOGRAPHY HEAD AND NECK TECHNIQUE: Multidetector CT imaging of the head and neck was performed using the standard protocol during bolus administration of intravenous contrast. Multiplanar CT image reconstructions and MIPs were obtained to evaluate the vascular anatomy. Carotid stenosis measurements (when applicable) are  obtained utilizing NASCET criteria, using the distal internal carotid diameter as the denominator. CONTRAST:  65m OMNIPAQUE IOHEXOL 350 MG/ML SOLN COMPARISON:  MRI brain 04/28/2015. FINDINGS: CT HEAD Brain: The area of acute infarction involving the posterior right insular cortex and sylvian fissure is not well visualized on either the noncontrast or source images. No other acute infarcts are present. Mild atrophy and white matter disease is again noted. Calvarium and skull base: Within normal limits. Paranasal sinuses: Clear. Orbits: The globes and orbits are intact. CTA NECK Aortic arch: A 3 vessel arch configuration is present. No significant atherosclerotic changes or stenosis are present at the level of the arch. Right carotid system: The right common carotid artery is within normal limits. Minimal atherosclerotic changes are present at the right carotid bifurcation and proximal right ICA without a  significant stenosis. The more distal cervical right ICA is normal. Left carotid system: The left common carotid artery is mildly tortuous. Atherosclerotic irregularity is present at the left carotid bifurcation. There is no significant stenosis at the carotid bifurcation or within the cervical left ICA. Vertebral arteries:Mild narrowing of less than 50% is present at the origin of the right vertebral artery. Both vertebral arteries originate from the subclavian arteries. The left vertebral artery is slightly dominant to the right. There is tortuosity in both vertebral arteries without a significant focal stenosis or vessel injury in the neck. Skeleton: There straightening and slight reversal of the normal cervical lordosis. Slight degenerative anterolisthesis is present at C2-3 and C3-4. There is chronic loss of disc height with endplate change in uncovertebral spurring at C4-5, C5-6, and C6-7 Other neck: A 13 mm hypodense nodule is present in the right lobe of the thyroid. There is mild heterogeneity throughout the thyroid. No other focal lesions are present. No focal mucosal or submucosal lesions are present. The salivary glands are within normal limits bilaterally. No significant adenopathy is present. Bilateral pleural effusions are present. There is associated atelectasis. Scattered ground-glass attenuation reflects edema or atelectasis. CTA HEAD Anterior circulation: Atherosclerotic changes are present within the cavernous internal carotid arteries bilaterally. There is no significant focal stenosis. The supraclinoid internal carotid artery is normal bilaterally. The A1 and M1 segments are within normal limits. The anterior communicating artery is patent. The MCA bifurcations are unremarkable. The ACA and MCA branch vessels are within normal limits. Posterior circulation: The left vertebral artery is the dominant vessel. The vertebrobasilar junction is within normal limits. The PICA origins are visualized and  normal bilaterally. The left posterior cerebral artery originates from the basilar tip. The right posterior cerebral artery is of fetal type. The PCA branch vessels are within normal limits bilaterally. Venous sinuses: The dural sinuses are patent. The right transverse sinus is dominant. The straight sinus and deep cerebral veins are intact. The cortical veins are unremarkable. Anatomic variants: Fetal type posterior artery on the right. Delayed phase: The postcontrast images demonstrate no pathologic enhancement. The posterior right insular cortex infarct is better visualized. IMPRESSION: 1. Stable appearance of posterior right insular cortex infarct. No new infarcts or significant progression are evident. 2. Atherosclerotic changes at the carotid bifurcations bilaterally without significant stenosis. 3. Less than 50% narrowing of the proximal right vertebral artery. 4. Atherosclerotic changes within the cavernous internal carotid arteries bilaterally without focal stenosis. Electronically Signed   By: CSan MorelleM.D.   On: 04/29/2015 15:45   Dg Chest 2 View  04/29/2015  CLINICAL DATA:  Admitted Friday for Afib and rapid heart rate. No chest complaints  today. EXAM: CHEST - 2 VIEW COMPARISON:  the previous day's study FINDINGS: Heart size upper limits normal as before. Slightly improved aeration. Patchy airspace opacities in the left lower lung. Coarse interstitial markings with some improvement in the interstitial edema or infiltrate seen previously. Surgical clips and staple lines left mid lung. No pneumothorax. No significant effusion. Spondylitic changes   in the lower thoracic and upper lumbar spine. IMPRESSION: 1. Improving aeration. Persistent patchy opacities at the left lung base. Electronically Signed   By: Lucrezia Europe M.D.   On: 04/29/2015 11:07   Dg Chest 2 View  04/28/2015  CLINICAL DATA:  Shortness of breath today EXAM: CHEST  2 VIEW COMPARISON:  04/27/2015 FINDINGS: Bibasilar  atelectasis. Small left pleural effusion. Heart is borderline in size. Postoperative changes in the left breast/axilla and left hilum. Improving interstitial prominence. IMPRESSION: Improving interstitial edema pattern. Bibasilar atelectasis with small left effusion. Electronically Signed   By: Rolm Baptise M.D.   On: 04/28/2015 07:25   Dg Chest 2 View  04/27/2015  CLINICAL DATA:  75 year old current history of asthma presenting with acute onset of shortness of breath and chest heaviness which began yesterday. Personal history of left upper lobe lung cancer post resection in 2007 and personal history of left breast cancer post lumpectomy in 2016. EXAM: CHEST  2 VIEW COMPARISON:  12/18/2011 and earlier. FINDINGS: Cardiac silhouette upper normal in size to slightly enlarged, unchanged. Thoracic aorta mildly atherosclerotic and tortuous, unchanged. Hilar and mediastinal contours otherwise unremarkable. Interstitial opacities throughout both lungs associated with small bilateral pleural effusions. Mild atelectasis in the lower lobes. Postsurgical changes in the lingula and left hilum. Surgical clips in the left breast and left axilla prior lumpectomy and node dissection. Degenerative changes throughout the thoracic and lumbar spine with thoracolumbar scoliosis convex left. IMPRESSION: Mild diffuse interstitial pulmonary edema and small bilateral pleural effusions likely indicating mild CHF. However, lymphangitic carcinomatosis can have a similar appearance. Electronically Signed   By: Evangeline Dakin M.D.   On: 04/27/2015 11:15   Ct Angio Neck W/cm &/or Wo/cm  04/29/2015  CLINICAL DATA:  Multi focal areas of acute nonhemorrhagic right MCA territory infarct. EXAM: CT ANGIOGRAPHY HEAD AND NECK TECHNIQUE: Multidetector CT imaging of the head and neck was performed using the standard protocol during bolus administration of intravenous contrast. Multiplanar CT image reconstructions and MIPs were obtained to evaluate  the vascular anatomy. Carotid stenosis measurements (when applicable) are obtained utilizing NASCET criteria, using the distal internal carotid diameter as the denominator. CONTRAST:  31m OMNIPAQUE IOHEXOL 350 MG/ML SOLN COMPARISON:  MRI brain 04/28/2015. FINDINGS: CT HEAD Brain: The area of acute infarction involving the posterior right insular cortex and sylvian fissure is not well visualized on either the noncontrast or source images. No other acute infarcts are present. Mild atrophy and white matter disease is again noted. Calvarium and skull base: Within normal limits. Paranasal sinuses: Clear. Orbits: The globes and orbits are intact. CTA NECK Aortic arch: A 3 vessel arch configuration is present. No significant atherosclerotic changes or stenosis are present at the level of the arch. Right carotid system: The right common carotid artery is within normal limits. Minimal atherosclerotic changes are present at the right carotid bifurcation and proximal right ICA without a significant stenosis. The more distal cervical right ICA is normal. Left carotid system: The left common carotid artery is mildly tortuous. Atherosclerotic irregularity is present at the left carotid bifurcation. There is no significant stenosis at the carotid bifurcation or within the  cervical left ICA. Vertebral arteries:Mild narrowing of less than 50% is present at the origin of the right vertebral artery. Both vertebral arteries originate from the subclavian arteries. The left vertebral artery is slightly dominant to the right. There is tortuosity in both vertebral arteries without a significant focal stenosis or vessel injury in the neck. Skeleton: There straightening and slight reversal of the normal cervical lordosis. Slight degenerative anterolisthesis is present at C2-3 and C3-4. There is chronic loss of disc height with endplate change in uncovertebral spurring at C4-5, C5-6, and C6-7 Other neck: A 13 mm hypodense nodule is present  in the right lobe of the thyroid. There is mild heterogeneity throughout the thyroid. No other focal lesions are present. No focal mucosal or submucosal lesions are present. The salivary glands are within normal limits bilaterally. No significant adenopathy is present. Bilateral pleural effusions are present. There is associated atelectasis. Scattered ground-glass attenuation reflects edema or atelectasis. CTA HEAD Anterior circulation: Atherosclerotic changes are present within the cavernous internal carotid arteries bilaterally. There is no significant focal stenosis. The supraclinoid internal carotid artery is normal bilaterally. The A1 and M1 segments are within normal limits. The anterior communicating artery is patent. The MCA bifurcations are unremarkable. The ACA and MCA branch vessels are within normal limits. Posterior circulation: The left vertebral artery is the dominant vessel. The vertebrobasilar junction is within normal limits. The PICA origins are visualized and normal bilaterally. The left posterior cerebral artery originates from the basilar tip. The right posterior cerebral artery is of fetal type. The PCA branch vessels are within normal limits bilaterally. Venous sinuses: The dural sinuses are patent. The right transverse sinus is dominant. The straight sinus and deep cerebral veins are intact. The cortical veins are unremarkable. Anatomic variants: Fetal type posterior artery on the right. Delayed phase: The postcontrast images demonstrate no pathologic enhancement. The posterior right insular cortex infarct is better visualized. IMPRESSION: 1. Stable appearance of posterior right insular cortex infarct. No new infarcts or significant progression are evident. 2. Atherosclerotic changes at the carotid bifurcations bilaterally without significant stenosis. 3. Less than 50% narrowing of the proximal right vertebral artery. 4. Atherosclerotic changes within the cavernous internal carotid arteries  bilaterally without focal stenosis. Electronically Signed   By: San Morelle M.D.   On: 04/29/2015 15:45   [name] Contrast  04/28/2015  CLINICAL DATA:  Chest pain.  Confusion.  Breast cancer. EXAM: MRI HEAD WITHOUT CONTRAST TECHNIQUE: Multiplanar, multiecho pulse sequences of the brain and surrounding structures were obtained without intravenous contrast. COMPARISON:  MR brain 05/16/2004. FINDINGS: Small foci of restricted diffusion are seen in the RIGHT parietal cortex, RIGHT insula, and RIGHT temporal operculum, consistent with multifocal areas of acute infarction. Although they are clustered within the same vascular distribution (RIGHT ICA/MCA), shower of emboli is suggested as the distribution is not typical of a watershed insult. No other areas infarction are seen. No hemorrhage, mass lesion, hydrocephalus, or extra-axial fluid. Moderate atrophy. T2 and FLAIR hyperintensity throughout the periventricular and subcortical white matter, likely chronic microvascular ischemic change. LEFT parietal subcortical infarct was present 2006. Flow voids are maintained throughout the carotid, basilar, and vertebral arteries. There are no areas of chronic hemorrhage. No intracranial large vessel occlusion. Normal pituitary and cerebellar tonsils. Advanced cervical spondylosis at C2-3, C3-4, and C4-5. Cord flattening may be present at the C4-5 level, similar to priors. There is no sinus disease. Negative orbits. 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 IMPRESSION: Multifocal subcentimeter areas of acute infarction affect the RIGHT MCA territory. Shower of emboli is suspected. No hemorrhage or significant mass effect. Chronic changes of atrophy and white matter disease as described. No MR evidence for large vessel occlusion. Cervical spondylosis, most severe at C4-C5. Electronically Signed   By: Staci Righter M.D.   On:  04/28/2015 10:33    ASSESSMENT AND PLAN   1. Atrial Fibrillation w/ RVR: new onset. n. TSH, Mg and K all WNL. Rate was stable in nuc med. On Iv heparin for anticoagulations.  - echo showed LV Ef of 55-60%, no wm abnormality, technically difficult study, mild to moderate AR.   2. CVA: in the setting of atrial fibrillation. Multifocal subcentimeter areas of acute infarction affect the RIGHT MCA territory. Shower of emboli is suspected. Neurological deficits have resolved. Given Afib as likely cause, she will need long term oral anticoagulation, however continue IV heparin for now. Pending nuc result.   3. HTN: Borderline low. Follow closely.   Signed, Bhagat,Bhavinkumar PA-C Pager [phone]  History and all data above reviewed.  Patient examined.  I agree with the findings as above.   No chest pain.  No SOB.  The patient exam reveals NOB:[id number]  ,  Lungs: Clear  ,  Abd: Positive bowel sounds, no rebound no guarding, Ext no edema  .  All available labs, radiology testing, previous records reviewed. Agree with documented assessment and plan. Chest pain:  Nuclear negative.  No further chest pain work up.  OK to stop heparin and start Eliquis.  Need to understand from neuro whether to keep on ASA.  No indication for ASA from a cardiac standpoint.  Atrial fib:  Rate seems to be OK.  We can follow up with a Holter to make sure that she has good rate control.   Jeneen Rinks Larsen Zettel  1:26 PM  04/30/2015

## 2015-04-30 NOTE — Progress Notes (Signed)
Patricia [name] for Heparin Indication: chest pain/ACS, new atrial fibrillation and recent stroke  Allergies  Allergen Reactions  . Combigan [Brimonidine Tartrate-Timolol]     Eyes itch, reddened  . Sulfa Antibiotics Rash    Patient Measurements: Height: '4\' 11"'$  (149.9 cm) Weight: 145 lb 1.6 oz (65.817 kg) IBW/kg (Calculated) : 43.2 Heparin Dosing Weight: 58 kg  Vital Signs: Temp: 98.1 F (36.7 C) (02/28 0523) BP: 119/88 mmHg (02/28 0621) Pulse Rate: 117 (02/28 0017)  Labs:  Recent Labs  04/27/15 1140 04/27/15 1520 04/28/15 0627 04/28/15 0806 04/28/15 1502  04/28/15 2011 04/29/15 0450 04/29/15 0510 04/29/15 1419 04/30/15 0610  HGB 13.9  --   --   --   --   --   --   --  13.1  --  14.7  HCT 41.3  --   --   --   --   --   --   --  39.8  --  44.5  PLT 202  --   --   --   --   --   --   --  195  --  265  LABPROT  --  13.8  --   --   --   --   --   --   --   --   --   INR  --  1.04  --   --   --   --   --   --   --   --   --   HEPARINUNFRC  --   --   --   --   --   < > 0.22* 0.51  --  0.34 0.71*  CREATININE 0.91  --  1.17*  --   --   --   --   --  1.01*  --  1.10*  TROPONINI  --   --   --  <0.03 <0.03  --  <0.03  --   --   --   --   < > = values in this interval not displayed.  Estimated Creatinine Clearance: 37 mL/min (by C-G formula based on Cr of 1.1).   Medical History: Past Medical History  Diagnosis Date  . Cough variant asthma   . Osteopenia   . Stricture and stenosis of cervix   . Postmenopausal   . GERD (gastroesophageal reflux disease)   . Disease of pharynx or nasopharynx   . Elevated BP   . Neoplasm   . Personal history of malignant neoplasm of bronchus and lung   . Allergic rhinitis, cause unspecified   . Diverticulosis   . Internal hemorrhoids   . Hiatal hernia   . Hypertension   . Laryngospasm   . 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 Hyperlipidemia   . Glaucoma   . Laryngospasm   .  Multinodular goiter   . Chronic cough   . 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 Radiation 10/22/14-11/23/14    Left Breast  . CHF (congestive heart failure) [facility])      Assessment: 75 yo F admitted 2/25 with 1d hx of SSCP. Trop neg and seen by cards who recommended stress test 2/27. Of note, pt also reported increased confusion so MD checked MRI which showed multifocal areas of infarct. Recent stroke but unsure of timing based on patient report of increased confusion for the last week.  Also found to have PAF.  To start low dose heparin.  Ultimately will need oral anticoagulation.  Heparin level supratherapeutic at 0.71 on 850 un/hr  Goal of Therapy:  Heparin level 0.3-0.5 units/ml Monitor platelets by anticoagulation protocol: Yes   Plan:  Decrease heparin drip to 750 units/h, check 8 hour level  Daily Heparin Level/CBC Monitor s/sx bleeding  Thank you for allowing [name] to participate in this patients care. [name], PharmD Pager: [phone] 04/30/2015 7:44 AM

## 2015-04-30 NOTE — Progress Notes (Signed)
Physical Therapy Treatment and Discharge Patient Details Name: Patricia [name] MRN: [id number] DOB: 1940-10-26 Today's Date: 04/30/2015    History of Present Illness This 75 y.o. female admitted with CP.  MI was ruled out.  Telemetry revealed new onset paroxysmal A-Fib.  On 04/28/15, she developed acute dysphasia.  MRI of brain showed multifocal areas of acute infarct affecting MCA territory.  PMH includes:  h/o lung CA with resection 2003; breast CA s/p radiation 2015, GERD.     PT Comments    Ms. Hamm is at supervision level ambulating in hall. Reviewed signs and symptoms of a stroke w/ pt and pt's husband.  No skilled PT needs at this time, PT is signing off.   Follow Up Recommendations  No PT follow up;Supervision for mobility/OOB     Equipment Recommendations  None recommended by PT    Recommendations for Other Services       Precautions / Restrictions Precautions Precaution Comments: monitor HR  Restrictions Weight Bearing Restrictions: No    Mobility  Bed Mobility Overal bed mobility: Independent                Transfers Overall transfer level: Modified independent Equipment used: None Transfers: Sit to/from Stand Sit to Stand: Modified independent (Device/Increase time)         General transfer comment: No cues or assist needed  Ambulation/Gait Ambulation/Gait assistance: Supervision Ambulation Distance (Feet): 300 Feet Assistive device: None Gait Pattern/deviations: Step-through pattern   Gait velocity interpretation: at or above normal speed for age/gender General Gait Details: HR highest at 110.  Min instability when turning to look at therapist while ambulating but otherwise steady.   Stairs            Wheelchair Mobility    Modified Rankin (Stroke Patients Only) Modified Rankin (Stroke Patients Only) Pre-Morbid Rankin Score: No symptoms Modified Rankin: Moderately severe disability     Balance Overall balance assessment: Modified  Independent Sitting-balance support: Feet supported;No upper extremity supported Sitting balance-Leahy Scale: Normal     Standing balance support: No upper extremity supported;During functional activity Standing balance-Leahy Scale: Good                      Cognition Arousal/Alertness: Awake/alert Behavior During Therapy: Impulsive Overall Cognitive Status: Impaired/Different from baseline Area of Impairment: Attention   Current Attention Level: Alternating                Exercises      General Comments General comments (skin integrity, edema, etc.): Reviewed signs and symptoms of a stroke w/ the pt and her husband and what to do if any noted.      Pertinent Vitals/Pain Pain Assessment: No/denies pain    Home Living                      Prior Function            PT Goals (current goals can now be found in the care plan section) Acute Rehab PT Goals Patient Stated Goal: to go home tomorrow Progress towards PT goals: Goals met/education completed, patient discharged from PT    Frequency       PT Plan      Co-evaluation             End of Session Equipment Utilized During Treatment: Gait belt Activity Tolerance: Patient tolerated treatment well Patient left: with call bell/phone within reach;in bed;with family/visitor present     Time: 6269-4854  PT Time Calculation (min) (ACUTE ONLY): 20 min  Charges:  $Gait Training: 8-22 mins                    G Codes:      Collie Siad PT, DPT  Pager: [phone] Phone: [phone] 04/30/2015, 3:34 PM

## 2015-04-30 NOTE — Progress Notes (Signed)
Patient Demographics:    Patricia [name], is a 75 y.o. female, DOB - January 10, 1941, MWN:[id number]  Admit date - 04/27/2015   Admitting Physician Reyne Dumas, MD  Outpatient Primary MD for the patient is Annye Asa, MD  LOS -    Chief Complaint  Patient presents with  . Shortness of Breath  . Chest Pain        Subjective:    Patricia [name] today has, No headache, No chest pain, No abdominal pain - No Nausea, No new weakness tingling or numbness, No Cough - SOB. Slight problems with her speech.   Assessment  & Plan :     1. Chest pain. Ruled out for MI, cardiology on board, she is now pain-free, on beta blocker and statin, due for stress test on 04/29/2015.  2. Paroxysmal atrial fibrillation diagnosed this admission. [place] vasc 2 score of 4 above. Discussed with both cardiology and neurology, for now heparin drip without bolus, full stroke protocol initiated. Neurology consulted. Continue statin. Placed on as needed IV Cardizem and Lopressor for rate control. Cardiology following.  3. 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 Likely due to undiagnosed Afib, Neuro consulted, stroke protocol initiated, Heparin without bolus, after Stress test she will be switched to Eliquis. LDL was above goal and she has been switched to Crestor, A1c was 5.7. CT angiogram head and neck non acute. Echo stable.  4. Mild asthma exacerbation upon admission. Resolved. Stop steroids and monitor with supportive care.  5. Hypothyroidism. Continue supplementation.  6. Dyslipidemia. On statin continue. Repeat lipid panel pending.  7. GERD. On PPI continue.  8. Essential hypertension. Continue present regimen.  9. History of carcinoid lung cancer and breast cancer. Continue follow-up with Dr.  [name]. Repeat 2 view chest x-ray in 7-10 days.    Code Status : Full  Family Communication  : None  Disposition Plan  : Home 1-2 days  Consults  :  Cards, Neuro  Procedures  :   MRI brain. Right MCA territory embolic infarcts.  Echogram   Left ventricle: The cavity size was normal. Systolic function wasnormal. The estimated ejection fraction was in the range of 55%to 60%. Wall motion was normal; there were no regional wallmotion abnormalities. The study is not technically sufficient toallow evaluation of LV diastolic function. Doppler parameters areconsistent with high ventricular filling pressure. - Aortic valve: Transvalvular velocity was within the normal range.There was no stenosis. There was mild to moderate regurgitation.Regurgitation pressure half-time: 369 ms. - Mitral valve: Transvalvular velocity was within the normal range.There was no evidence for stenosis. There was trivialregurgitation. - Left atrium: The atrium was severely dilated. - Right ventricle: The cavity size was normal. Wall thickness wasnormal. Systolic function was normal. - Atrial septum: No defect or patent foramen ovale was identifiedby color flow Doppler. - Tricuspid valve: There was mild regurgitation. - Inferior vena cava: The vessel was normal in size. Therespirophasic diameter changes were in the normal range (>= 50%),consistent with normal central venous pressure  CT angiogram head and neck  Stress test 04/29/2015  DVT Prophylaxis  :    Heparin gtt  Lab Results  Component Value Date   PLT 265 04/30/2015    Inpatient Medications  Scheduled Meds: .  gabapentin  400 mg Oral BID  . guaiFENesin  600 mg Oral BID  . latanoprost  1 drop Both Eyes QHS  . metoprolol tartrate  25 mg Oral BID  . mometasone-formoterol  2 puff Inhalation BID  . pantoprazole  40 mg Oral Daily  . potassium chloride  40 mEq Oral Once  . regadenoson      . regadenoson  0.4 mg Intravenous Once  .  rosuvastatin  40 mg Oral QHS  . tamoxifen  20 mg Oral QHS  . thyroid  30 mg Oral QAC breakfast   Continuous Infusions: . diltiazem (CARDIZEM) infusion 15 mg/hr (04/29/15 2143)  . heparin 750 Units/hr (04/30/15 0755)   PRN Meds:.acetaminophen, diltiazem, guaiFENesin-dextromethorphan, levalbuterol, magnesium hydroxide, metoprolol, ondansetron (ZOFRAN) IV  Antibiotics  :     Anti-infectives    None        Objective:   Filed Vitals:   04/30/15 1111 04/30/15 1113 04/30/15 1115 04/30/15 1117  BP: 137/46 91/56 103/62 91/64  Pulse: 90 97 102 100  Temp:      TempSrc:      Resp:      Height:      Weight:      SpO2:        Wt Readings from Last 3 Encounters:  04/30/15 65.817 kg (145 lb 1.6 oz)  02/14/15 65.998 kg (145 lb 8 oz)  12/20/14 65.908 kg (145 lb 4.8 oz)     Intake/Output Summary (Last 24 hours) at 04/30/15 1128 Last data filed at 04/30/15 0816  Gross per 24 hour  Intake 1629.35 ml  Output   3600 ml  Net -1970.65 ml     Physical Exam  Awake Alert, Oriented X 3, No new F.N deficits, Mild dysarthria, Normal affect [place].AT,PERRAL Supple Neck,No JVD, No cervical lymphadenopathy appriciated.  Symmetrical Chest wall movement, Good air movement bilaterally, CTAB RRR,No Gallops,Rubs or new Murmurs, No Parasternal Heave +ve B.Sounds, Abd Soft, No tenderness, No organomegaly appriciated, No rebound - guarding or rigidity. No Cyanosis, Clubbing or edema, No new Rash or bruise      Data Review:   Micro Results Recent Results (from the past 240 hour(s))  Urine culture     Status: None   Collection Time: 04/28/15 11:14 AM  Result Value Ref Range Status   Specimen Description URINE, RANDOM  Final   Special Requests NONE  Final   Culture 3,000 COLONIES/mL INSIGNIFICANT GROWTH  Final   Report Status 04/29/2015 FINAL  Final    Radiology Reports Ct Angio Head W/cm &/or Wo Cm  04/29/2015  CLINICAL DATA:  Multi focal areas of acute nonhemorrhagic right MCA territory  infarct. EXAM: CT ANGIOGRAPHY HEAD AND NECK TECHNIQUE: Multidetector CT imaging of the head and neck was performed using the standard protocol during bolus administration of intravenous contrast. Multiplanar CT image reconstructions and MIPs were obtained to evaluate the vascular anatomy. Carotid stenosis measurements (when applicable) are obtained utilizing NASCET criteria, using the distal internal carotid diameter as the denominator. CONTRAST:  58m OMNIPAQUE IOHEXOL 350 MG/ML SOLN COMPARISON:  MRI brain 04/28/2015. FINDINGS: CT HEAD Brain: The area of acute infarction involving the posterior right insular cortex and sylvian fissure is not well visualized on either the noncontrast or source images. No other acute infarcts are present. Mild atrophy and white matter disease is again noted. Calvarium and skull base: Within normal limits. Paranasal sinuses: Clear. Orbits: The globes and orbits are intact. CTA NECK Aortic arch: A 3 vessel arch configuration is present. No  significant atherosclerotic changes or stenosis are present at the level of the arch. Right carotid system: The right common carotid artery is within normal limits. Minimal atherosclerotic changes are present at the right carotid bifurcation and proximal right ICA without a significant stenosis. The more distal cervical right ICA is normal. Left carotid system: The left common carotid artery is mildly tortuous. Atherosclerotic irregularity is present at the left carotid bifurcation. There is no significant stenosis at the carotid bifurcation or within the cervical left ICA. Vertebral arteries:Mild narrowing of less than 50% is present at the origin of the right vertebral artery. Both vertebral arteries originate from the subclavian arteries. The left vertebral artery is slightly dominant to the right. There is tortuosity in both vertebral arteries without a significant focal stenosis or vessel injury in the neck. Skeleton: There straightening and  slight reversal of the normal cervical lordosis. Slight degenerative anterolisthesis is present at C2-3 and C3-4. There is chronic loss of disc height with endplate change in uncovertebral spurring at C4-5, C5-6, and C6-7 Other neck: A 13 mm hypodense nodule is present in the right lobe of the thyroid. There is mild heterogeneity throughout the thyroid. No other focal lesions are present. No focal mucosal or submucosal lesions are present. The salivary glands are within normal limits bilaterally. No significant adenopathy is present. Bilateral pleural effusions are present. There is associated atelectasis. Scattered ground-glass attenuation reflects edema or atelectasis. CTA HEAD Anterior circulation: Atherosclerotic changes are present within the cavernous internal carotid arteries bilaterally. There is no significant focal stenosis. The supraclinoid internal carotid artery is normal bilaterally. The A1 and M1 segments are within normal limits. The anterior communicating artery is patent. The MCA bifurcations are unremarkable. The ACA and MCA branch vessels are within normal limits. Posterior circulation: The left vertebral artery is the dominant vessel. The vertebrobasilar junction is within normal limits. The PICA origins are visualized and normal bilaterally. The left posterior cerebral artery originates from the basilar tip. The right posterior cerebral artery is of fetal type. The PCA branch vessels are within normal limits bilaterally. Venous sinuses: The dural sinuses are patent. The right transverse sinus is dominant. The straight sinus and deep cerebral veins are intact. The cortical veins are unremarkable. Anatomic variants: Fetal type posterior artery on the right. Delayed phase: The postcontrast images demonstrate no pathologic enhancement. The posterior right insular cortex infarct is better visualized. IMPRESSION: 1. Stable appearance of posterior right insular cortex infarct. No new infarcts or  significant progression are evident. 2. Atherosclerotic changes at the carotid bifurcations bilaterally without significant stenosis. 3. Less than 50% narrowing of the proximal right vertebral artery. 4. Atherosclerotic changes within the cavernous internal carotid arteries bilaterally without focal stenosis. Electronically Signed   By: San Morelle M.D.   On: 04/29/2015 15:45   Dg Chest 2 View  04/29/2015  CLINICAL DATA:  Admitted Friday for Afib and rapid heart rate. No chest complaints today. EXAM: CHEST - 2 VIEW COMPARISON:  the previous day's study FINDINGS: Heart size upper limits normal as before. Slightly improved aeration. Patchy airspace opacities in the left lower lung. Coarse interstitial markings with some improvement in the interstitial edema or infiltrate seen previously. Surgical clips and staple lines left mid lung. No pneumothorax. No significant effusion. Spondylitic changes   in the lower thoracic and upper lumbar spine. IMPRESSION: 1. Improving aeration. Persistent patchy opacities at the left lung base. Electronically Signed   By: Lucrezia Europe M.D.   On: 04/29/2015 11:07   Dg  Chest 2 View  04/28/2015  CLINICAL DATA:  Shortness of breath today EXAM: CHEST  2 VIEW COMPARISON:  04/27/2015 FINDINGS: Bibasilar atelectasis. Small left pleural effusion. Heart is borderline in size. Postoperative changes in the left breast/axilla and left hilum. Improving interstitial prominence. IMPRESSION: Improving interstitial edema pattern. Bibasilar atelectasis with small left effusion. Electronically Signed   By: Rolm Baptise M.D.   On: 04/28/2015 07:25   Dg Chest 2 View  04/27/2015  CLINICAL DATA:  75 year old current history of asthma presenting with acute onset of shortness of breath and chest heaviness which began yesterday. Personal history of left upper lobe lung cancer post resection in 2007 and personal history of left breast cancer post lumpectomy in 2016. EXAM: CHEST  2 VIEW COMPARISON:   12/18/2011 and earlier. FINDINGS: Cardiac silhouette upper normal in size to slightly enlarged, unchanged. Thoracic aorta mildly atherosclerotic and tortuous, unchanged. Hilar and mediastinal contours otherwise unremarkable. Interstitial opacities throughout both lungs associated with small bilateral pleural effusions. Mild atelectasis in the lower lobes. Postsurgical changes in the lingula and left hilum. Surgical clips in the left breast and left axilla prior lumpectomy and node dissection. Degenerative changes throughout the thoracic and lumbar spine with thoracolumbar scoliosis convex left. IMPRESSION: Mild diffuse interstitial pulmonary edema and small bilateral pleural effusions likely indicating mild CHF. However, lymphangitic carcinomatosis can have a similar appearance. Electronically Signed   By: Evangeline Dakin M.D.   On: 04/27/2015 11:15   Ct Angio Neck W/cm &/or Wo/cm  04/29/2015  CLINICAL DATA:  Multi focal areas of acute nonhemorrhagic right MCA territory infarct. EXAM: CT ANGIOGRAPHY HEAD AND NECK TECHNIQUE: Multidetector CT imaging of the head and neck was performed using the standard protocol during bolus administration of intravenous contrast. Multiplanar CT image reconstructions and MIPs were obtained to evaluate the vascular anatomy. Carotid stenosis measurements (when applicable) are obtained utilizing NASCET criteria, using the distal internal carotid diameter as the denominator. CONTRAST:  35m OMNIPAQUE IOHEXOL 350 MG/ML SOLN COMPARISON:  MRI brain 04/28/2015. FINDINGS: CT HEAD Brain: The area of acute infarction involving the posterior right insular cortex and sylvian fissure is not well visualized on either the noncontrast or source images. No other acute infarcts are present. Mild atrophy and white matter disease is again noted. Calvarium and skull base: Within normal limits. Paranasal sinuses: Clear. Orbits: The globes and orbits are intact. CTA NECK Aortic arch: A 3 vessel arch  configuration is present. No significant atherosclerotic changes or stenosis are present at the level of the arch. Right carotid system: The right common carotid artery is within normal limits. Minimal atherosclerotic changes are present at the right carotid bifurcation and proximal right ICA without a significant stenosis. The more distal cervical right ICA is normal. Left carotid system: The left common carotid artery is mildly tortuous. Atherosclerotic irregularity is present at the left carotid bifurcation. There is no significant stenosis at the carotid bifurcation or within the cervical left ICA. Vertebral arteries:Mild narrowing of less than 50% is present at the origin of the right vertebral artery. Both vertebral arteries originate from the subclavian arteries. The left vertebral artery is slightly dominant to the right. There is tortuosity in both vertebral arteries without a significant focal stenosis or vessel injury in the neck. Skeleton: There straightening and slight reversal of the normal cervical lordosis. Slight degenerative anterolisthesis is present at C2-3 and C3-4. There is chronic loss of disc height with endplate change in uncovertebral spurring at C4-5, C5-6, and C6-7 Other neck: A 13 mm  hypodense nodule is present in the right lobe of the thyroid. There is mild heterogeneity throughout the thyroid. No other focal lesions are present. No focal mucosal or submucosal lesions are present. The salivary glands are within normal limits bilaterally. No significant adenopathy is present. Bilateral pleural effusions are present. There is associated atelectasis. Scattered ground-glass attenuation reflects edema or atelectasis. CTA HEAD Anterior circulation: Atherosclerotic changes are present within the cavernous internal carotid arteries bilaterally. There is no significant focal stenosis. The supraclinoid internal carotid artery is normal bilaterally. The A1 and M1 segments are within normal limits.  The anterior communicating artery is patent. The MCA bifurcations are unremarkable. The ACA and MCA branch vessels are within normal limits. Posterior circulation: The left vertebral artery is the dominant vessel. The vertebrobasilar junction is within normal limits. The PICA origins are visualized and normal bilaterally. The left posterior cerebral artery originates from the basilar tip. The right posterior cerebral artery is of fetal type. The PCA branch vessels are within normal limits bilaterally. Venous sinuses: The dural sinuses are patent. The right transverse sinus is dominant. The straight sinus and deep cerebral veins are intact. The cortical veins are unremarkable. Anatomic variants: Fetal type posterior artery on the right. Delayed phase: The postcontrast images demonstrate no pathologic enhancement. The posterior right insular cortex infarct is better visualized. IMPRESSION: 1. Stable appearance of posterior right insular cortex infarct. No new infarcts or significant progression are evident. 2. Atherosclerotic changes at the carotid bifurcations bilaterally without significant stenosis. 3. Less than 50% narrowing of the proximal right vertebral artery. 4. Atherosclerotic changes within the cavernous internal carotid arteries bilaterally without focal stenosis. Electronically Signed   By: San Morelle M.D.   On: 04/29/2015 15:45   [name] Contrast  04/28/2015  CLINICAL DATA:  Chest pain.  Confusion.  Breast cancer. EXAM: MRI HEAD WITHOUT CONTRAST TECHNIQUE: Multiplanar, multiecho pulse sequences of the brain and surrounding structures were obtained without intravenous contrast. COMPARISON:  MR brain 05/16/2004. FINDINGS: Small foci of restricted diffusion are seen in the RIGHT parietal cortex, RIGHT insula, and RIGHT temporal operculum, consistent with multifocal areas of acute infarction. Although they are clustered within the same vascular distribution (RIGHT ICA/MCA), shower of emboli  is suggested as the distribution is not typical of a watershed insult. No other areas infarction are seen. No hemorrhage, mass lesion, hydrocephalus, or extra-axial fluid. Moderate atrophy. T2 and FLAIR hyperintensity throughout the periventricular and subcortical white matter, likely chronic microvascular ischemic change. LEFT parietal subcortical infarct was present 2006. Flow voids are maintained throughout the carotid, basilar, and vertebral arteries. There are no areas of chronic hemorrhage. No intracranial large vessel occlusion. Normal pituitary and cerebellar tonsils. Advanced cervical spondylosis at C2-3, C3-4, and C4-5. Cord flattening may be present at the C4-5 level, similar to priors. There is no sinus disease. Negative orbits. 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 IMPRESSION: Multifocal subcentimeter areas of acute infarction affect the RIGHT MCA territory. Shower of emboli is suspected. No hemorrhage or significant mass effect. Chronic changes of atrophy and white matter disease as described. No MR evidence for large vessel occlusion. Cervical spondylosis, most severe at C4-C5. Electronically Signed   By: Staci Righter M.D.   On: 04/28/2015 10:33     CBC  Recent Labs Lab 04/27/15 1140 04/29/15 0510 04/30/15 0610  WBC 8.0 11.1* 9.9  HGB 13.9 13.1 14.7  HCT 41.3 39.8 44.5  PLT 202 195  265  MCV 94.9 94.5 95.7  MCH 32.0 31.1 31.6  MCHC 33.7 32.9 33.0  RDW 12.8 13.0 13.2    Chemistries   Recent Labs Lab 04/27/15 1140 04/28/15 0627 04/29/15 0510 04/30/15 0610  NA 140 143 140 141  K 4.2 4.2 3.9 3.4*  CL 110 107 107 104  CO2 21* '25 22 24  '$ GLUCOSE 92 157* 112* 127*  BUN 18 22* 23* 19  CREATININE 0.91 1.17* 1.01* 1.10*  CALCIUM 8.7* 9.2 8.7* 9.0  MG  --  2.1  --   --     ------------------------------------------------------------------------------------------------------------------  Recent Labs  04/29/15 0510  CHOL 167  HDL 57  LDLCALC 95  TRIG 75  CHOLHDL 2.9    Lab Results  Component Value Date   HGBA1C 5.7* 04/29/2015   ------------------------------------------------------------------------------------------------------------------  Recent Labs  04/27/15 1520  TSH 2.802   ------------------------------------------------------------------------------------------------------------------ No results for input(s): VITAMINB12, FOLATE, FERRITIN, TIBC, IRON, RETICCTPCT in the last 72 hours.  Coagulation profile  Recent Labs Lab 04/27/15 1520  INR 1.04     Recent Labs  04/27/15 1520  DDIMER 0.44    Cardiac Enzymes  Recent Labs Lab 04/28/15 0806 04/28/15 1502 04/28/15 2011  TROPONINI <0.03 <0.03 <0.03   ------------------------------------------------------------------------------------------------------------------    Component Value Date/Time   BNP 404.7* 04/27/2015 1140    Time Spent in minutes  30   Kerry Chisolm K M.D on 04/30/2015 at 11:28 AM  Between 7am to 7pm - Pager - [phone]  After 7pm go to [email or web address] - password [facility]  [facility] -  Office  [phone]

## 2015-05-01 DIAGNOSIS — I481 Persistent atrial fibrillation: Secondary | ICD-10-CM

## 2015-05-01 LAB — CBC
HEMATOCRIT: 40.6 % (ref 36.0–46.0)
Hemoglobin: 13.4 g/dL (ref 12.0–15.0)
MCH: 31.5 pg (ref 26.0–34.0)
MCHC: 33 g/dL (ref 30.0–36.0)
MCV: 95.5 fL (ref 78.0–100.0)
PLATELETS: 229 10*3/uL (ref 150–400)
RBC: 4.25 MIL/uL (ref 3.87–5.11)
RDW: 13.3 % (ref 11.5–15.5)
WBC: 6.6 10*3/uL (ref 4.0–10.5)

## 2015-05-01 LAB — BASIC METABOLIC PANEL
Anion gap: 9 (ref 5–15)
BUN: 21 mg/dL — ABNORMAL HIGH (ref 6–20)
CALCIUM: 8.9 mg/dL (ref 8.9–10.3)
CO2: 22 mmol/L (ref 22–32)
CREATININE: 1.2 mg/dL — AB (ref 0.44–1.00)
Chloride: 111 mmol/L (ref 101–111)
GFR calc non Af Amer: 43 mL/min — ABNORMAL LOW (ref >60)
GFR, EST AFRICAN AMERICAN: 50 mL/min — AB (ref >60)
GLUCOSE: 116 mg/dL — AB (ref 65–99)
Potassium: 4.2 mmol/L (ref 3.5–5.1)
Sodium: 142 mmol/L (ref 135–145)

## 2015-05-01 LAB — MAGNESIUM: Magnesium: 2.1 mg/dL (ref 1.7–2.4)

## 2015-05-01 MED ORDER — DILTIAZEM HCL 100 MG IV SOLR: 5.0000 mg/h | INTRAVENOUS | Status: DC

## 2015-05-01 MED ORDER — DILTIAZEM HCL 60 MG PO TABS: 60.0000 mg | ORAL_TABLET | Freq: Two times a day (BID) | ORAL | Status: DC

## 2015-05-01 MED ORDER — DIGOXIN 0.25 MG/ML IJ SOLN
0.1250 mg | Freq: Four times a day (QID) | INTRAMUSCULAR | Status: AC
  Administered 2015-05-01 (×2): 0.125 mg via INTRAVENOUS
  Filled 2015-05-01 (×2): qty 2

## 2015-05-01 MED ORDER — DILTIAZEM HCL 60 MG PO TABS
60.0000 mg | ORAL_TABLET | Freq: Four times a day (QID) | ORAL | Status: DC
  Administered 2015-05-01 – 2015-05-02 (×4): 60 mg via ORAL
  Filled 2015-05-01 (×4): qty 1

## 2015-05-01 MED ORDER — DIGOXIN 125 MCG PO TABS
0.1250 mg | ORAL_TABLET | Freq: Every day | ORAL | Status: DC
  Administered 2015-05-02: 0.125 mg via ORAL
  Filled 2015-05-01: qty 1

## 2015-05-01 NOTE — Discharge Instructions (Signed)

## 2015-05-01 NOTE — Progress Notes (Signed)
Notified Dr. Candiss Norse of pt's current HR at rest and with ambulation. Also informed him of cardiology's recommendations. New orders received for HR control. Will continue to monitor patient closely

## 2015-05-01 NOTE — Clinical Documentation Improvement (Signed)
Cardiology Internal Medicine  Can chest pain be further specified?   2/2 afib  2/2 asthma  2/2 CHF/pulmoary edema (please specify acuity and type of CHF)  Angina  Other  Clinically Undetermined  Supporting Information: -- New diagnosis PAF this admit -- CXR shows CHF -- ECHO 55-60% EF  Please exercise your independent, professional judgment when responding. A specific answer is not anticipated or expected.  Thank You,  Ezekiel Ina RN [place] [phone]

## 2015-05-01 NOTE — Progress Notes (Signed)
Patricia [name] Profile: 75 yo female with a history of lung cancer with resection in 2003, breast cancer s/p radiation therapy last year, GERD and HLD.No prior Cardiac history. She presented 04/27/15 withChest pain and ruled out for MI with negative enzymes. Telemetry showed new onset paroxsymal atrial fibrillation. 04/28/15 she developed acute dysphasia. Brain MRI shows multifocal areas of acute infarction affecting right MCA territory. EF 55-60% by echo.   Subjective: Feels fine. Denies any recurrent CP. Asymptomatic from her afib. No residual neurological deficits.   Objective: Vital signs in last 24 hours: Temp:  [98 F (36.7 C)-98.8 F (37.1 C)] 98.4 F (36.9 C) (03/01 0751) Pulse Rate:  [75-102] 83 (03/01 0751) Resp:  [15-18] 18 (03/01 0751) BP: (91-152)/(46-77) 113/63 mmHg (03/01 0751) SpO2:  [94 %-96 %] 94 % (03/01 0751) Weight:  [145 lb 3.2 oz (65.862 kg)] 145 lb 3.2 oz (65.862 kg) (03/01 0335) Last BM Date: 05/01/15  Intake/Output from previous day: 02/28 0701 - 03/01 0700 In: 919.9 [P.O.:480; I.V.:439.9] Out: 200 [Urine:200] Intake/Output this shift:    Medications Current Facility-Administered Medications  Medication Dose Route Frequency Provider Last Rate Last Dose  . acetaminophen (TYLENOL) tablet 650 mg  650 mg Oral Q4H PRN Rondel Jumbo, PA-C   650 mg at 04/27/15 1835  . apixaban (ELIQUIS) tablet 5 mg  5 mg Oral BID Minus Breeding, MD   5 mg at 05/01/15 0758  . diltiazem (CARDIZEM) 100 mg in dextrose 5 % 100 mL (1 mg/mL) infusion  5-15 mg/hr Intravenous Titrated Brittainy Erie Noe, PA-C 15 mL/hr at 05/01/15 0400 15 mg/hr at 05/01/15 0400  . diltiazem (CARDIZEM) injection 10 mg  10 mg Intravenous Q6H PRN Thurnell Lose, MD   10 mg at 04/30/15 0522  . gabapentin (NEURONTIN) capsule 400 mg  400 mg Oral BID Rondel Jumbo, PA-C   400 mg at 05/01/15 0757  . guaiFENesin (MUCINEX) 12 hr tablet 600 mg  600 mg Oral BID Rondel Jumbo, PA-C   600 mg at 05/01/15 7341  .  guaiFENesin-dextromethorphan (ROBITUSSIN DM) 100-10 MG/5ML syrup 5 mL  5 mL Oral Q4H PRN Rondel Jumbo, PA-C      . latanoprost (XALATAN) 0.005 % ophthalmic solution 1 drop  1 drop Both Eyes QHS Rondel Jumbo, PA-C   1 drop at 04/30/15 2112  . levalbuterol (XOPENEX) nebulizer solution 1.25 mg  1.25 mg Nebulization Q6H PRN Rondel Jumbo, PA-C      . magnesium hydroxide (MILK OF MAGNESIA) suspension 5 mL  5 mL Oral Daily PRN Thurnell Lose, MD      . metoprolol (LOPRESSOR) injection 5 mg  5 mg Intravenous Q4H PRN Thurnell Lose, MD   5 mg at 04/29/15 0824  . metoprolol tartrate (LOPRESSOR) tablet 25 mg  25 mg Oral BID Consuelo Pandy, PA-C   25 mg at 05/01/15 0758  . mometasone-formoterol (DULERA) 100-5 MCG/ACT inhaler 2 puff  2 puff Inhalation BID Rondel Jumbo, PA-C   2 puff at 05/01/15 0757  . ondansetron (ZOFRAN) injection 4 mg  4 mg Intravenous Q6H PRN Rondel Jumbo, PA-C      . pantoprazole (PROTONIX) EC tablet 40 mg  40 mg Oral Daily Rondel Jumbo, PA-C   40 mg at 05/01/15 0758  . regadenoson (LEXISCAN) injection SOLN 0.4 mg  0.4 mg Intravenous Once Thurnell Lose, MD   0.4 mg at 04/30/15 1236  . rosuvastatin (CRESTOR) tablet 40 mg  40 mg Oral  QHS Thurnell Lose, MD   40 mg at 04/30/15 2111  . tamoxifen (NOLVADEX) tablet 20 mg  20 mg Oral QHS Thurnell Lose, MD   20 mg at 04/30/15 2111  . thyroid (ARMOUR) tablet 30 mg  30 mg Oral QAC breakfast Rondel Jumbo, PA-C   30 mg at 05/01/15 0757    PE: General appearance: alert, cooperative and no distress Neck: no carotid bruit and no JVD Lungs: clear to auscultation bilaterally Heart: irregularly irregular rhythm and tachy rate Extremities: no LEE Pulses: 2+ and symmetric Skin: warm and dry Neurologic: Grossly normal  Lab Results:   Recent Labs  04/29/15 0510 04/30/15 0610 05/01/15 0420  WBC 11.1* 9.9 6.6  HGB 13.1 14.7 13.4  HCT 39.8 44.5 40.6  PLT 195 265 229   BMET  Recent Labs  04/29/15 0510  04/30/15 0610 05/01/15 0420  NA 140 141 142  K 3.9 3.4* 4.2  CL 107 104 111  CO2 '22 24 22  '$ GLUCOSE 112* 127* 116*  BUN 23* 19 21*  CREATININE 1.01* 1.10* 1.20*  CALCIUM 8.7* 9.0 8.9   PT/INR No results for input(s): LABPROT, INR in the last 72 hours. Cholesterol  Recent Labs  04/29/15 0510  CHOL 167   Cardiac Panel (last 3 results)  Recent Labs  04/28/15 1502 04/28/15 2011  TROPONINI <0.03 <0.03    Studies/Results: NST 04/30/15  FINDINGS: Perfusion: No decreased activity in the left ventricle on stress imaging to suggest reversible ischemia or infarction. There is a combination of mild breast attenuation and apical thinning.  Wall Motion: Normal left ventricular wall motion. No left ventricular dilation.  Left Ventricular Ejection Fraction: 60 %  End diastolic volume 47 ml  End systolic volume 19 ml  IMPRESSION: 1. No reversible ischemia or infarction.  2. Normal left ventricular wall motion.  3. Left ventricular ejection fraction 60%  4. Low-risk stress test findings*. Assessment/Plan  Principal Problem:   Chest pain Active Problems:   HYPERTENSION, BENIGN ESSENTIAL   GERD   CHEST PAIN   Hyperthyroidism   Pulmonary edema   Asthma   Paroxysmal atrial fibrillation (HCC)   CVA (cerebral infarction)   Acute CVA (cerebrovascular accident) ([place])   1. Chest Pain: Patricia [name] ruled out for MI with negative cardiac enzymes. 2D echo with normal LVEF of 55-60%. NST was negative for reversible ischemia and infarction. Low risk study. Symptoms may have been caused by her atrial fibrillation w/ RVR. No further ischemic w/u indicated at this time.   2. New Onset Atrial Fibrillation: still in atrial fibrillation. Rate is better controlled but still slightly elevated in the low 100-110s. She had difficulties with rate control with PO and PRN metoprolol along. She has had better success with addition of IV Cardizem. Recommend continuation of dual therapy  with metoprolol and Cardizem for rate control. Will transition to PO Cardizem today with 2 hr IV Cardizem overlap. CHA2DS2 VASc score is 4 for CVA, female sex and age 62-74. Her stroke occurred this admission. Continue Eliquis for secondary prevention.   3.  CVA: in the setting of atrial fibrillation. Multifocal subcentimeter areas of acute infarction affect the RIGHT MCA territory. Shower of emboli is suspected. Neurological deficits have resolved. Given her Afib, she will need to continue long term oral anticoagulation. Continue Eliquis 5 mg BID.   5. DLD: LDL 105 mg/dL. She is on statin therapy with Crestor.      LOS: 1 day    Brittainy M. Rosita Fire, PA-C 05/01/2015  8:29 AM  History and all data above reviewed.  Patricia [name] examined.  I agree with the findings as above.  No chest pain.  No SOB. Ambulated.    The Patricia [name] exam reveals [id number]  ,  Lungs: .clear  ,  Abd: Positive bowel sounds, no rebound no guarding, Ext No edema   .  All available labs, radiology testing, previous records reviewed. Agree with documented assessment and plan. Atrial fib:  OK to go home later today I think when on PO Cardizem.  We can further adjust meds for rate control as an outpatient.   Jeneen Rinks Goldie Tregoning  11:49 AM  05/01/2015

## 2015-05-01 NOTE — Progress Notes (Addendum)
Patient Demographics:    Patricia [name], is a 75 y.o. female, DOB - January 11, 1941, YIF:[id number]  Admit date - 04/27/2015   Admitting Physician Reyne Dumas, MD  Outpatient Primary MD for the patient is Annye Asa, MD  LOS -   Summary - pleasant 75yrold caucasian female with H/O lung cancer s/p resection 2003, and ER+ breast Ca, s/p lumpectomy on observation after Tamoxifen and radiation, last in 11/2014, HTN, hyperlipidemia, hypothyroidism, presenting with 1 day history of substernal chest pain described as 6/10, she ruled out for MI, but was found to have new Afib and CVA upon admission, Cards and Neuro following, on Cardizem drip, likely transition to PO meds soon and DC, symptom free now.   Chief Complaint  Patient presents with  . Shortness of Breath  . Chest Pain        Subjective:    [name] has, No headache, No chest pain, No abdominal pain - No Nausea, No new weakness tingling or numbness, No Cough - SOB. Slight problems with her speech.   Assessment  & Plan :     1. Chest pain. Ruled out for MI, cardiology on board, she is now pain-free, on beta blocker and statin, -ve Myoview stress test on 04/29/2015.  2. Paroxysmal atrial fibrillation diagnosed this admission. CMalivasc 2 score of 4 above. Discussed with both cardiology and neurology, was on heparin drip and now on Eliquis, currently on IV Cardizem drip and PO Lopressor for rate control. Cardiology following. Will likely be switched to PO meds soon.  Addendum - at 3.30pm H Rate still 115s, SBP 110, added Digoxin.  3. 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 Likely due to undiagnosed Afib, Neuro consulted, stroke protocol initiated, initially on Heparin now switched to Eliquis. LDL was above  goal and she has been switched to Crestor, A1c was 5.7. CT angiogram head and neck non acute. Echo stable. Symptoms almost resolved.  4. Mild asthma exacerbation upon admission. Resolved. Stop steroids and monitor with supportive care.  5. Hypothyroidism. Continue supplementation.  6. Dyslipidemia. On statin continue. Repeat lipid panel pending.  7. GERD. On PPI continue.  8. Essential hypertension. Continue present regimen.  9. History of carcinoid lung cancer and breast cancer. Continue follow-up with Dr. MJana Hakim Repeat 2 view chest x-ray in 7-10 days.    Code Status : Full  Family Communication  : None  Disposition Plan  : Home 1-2 days  Consults  :  Cards, Neuro  Procedures  :   MRI brain. Right MCA territory embolic infarcts.  Echogram   Left ventricle: The cavity size was normal. Systolic function wasnormal. The estimated ejection fraction was in the range of 55%to 60%. Wall motion was normal; there were no regional wallmotion abnormalities. The study is not technically sufficient toallow evaluation of LV diastolic function. Doppler parameters areconsistent with high ventricular filling pressure. - Aortic valve: Transvalvular velocity was within the normal range.There was no stenosis. There was mild to moderate regurgitation.Regurgitation pressure half-time: 369 ms. - Mitral valve: Transvalvular velocity was within the normal range.There was no evidence for stenosis. There was trivialregurgitation. - Left atrium: The atrium was severely dilated. - Right ventricle: The cavity size was normal. Wall thickness  wasnormal. Systolic function was normal. - Atrial septum: No defect or patent foramen ovale was identifiedby color flow Doppler. - Tricuspid valve: There was mild regurgitation. - Inferior vena cava: The vessel was normal in size. Therespirophasic diameter changes were in the normal range (>= 50%),consistent with normal central venous pressure  CT  angiogram head and neck  Myoview Stress test 04/29/2015 -  Negative   DVT Prophylaxis  :    Heparin gtt  Lab Results  Component Value Date   PLT 229 05/01/2015    Inpatient Medications  Scheduled Meds: . apixaban  5 mg Oral BID  . gabapentin  400 mg Oral BID  . guaiFENesin  600 mg Oral BID  . latanoprost  1 drop Both Eyes QHS  . metoprolol tartrate  25 mg Oral BID  . mometasone-formoterol  2 puff Inhalation BID  . pantoprazole  40 mg Oral Daily  . regadenoson  0.4 mg Intravenous Once  . rosuvastatin  40 mg Oral QHS  . tamoxifen  20 mg Oral QHS  . thyroid  30 mg Oral QAC breakfast   Continuous Infusions: . diltiazem (CARDIZEM) infusion 15 mg/hr (05/01/15 0400)   PRN Meds:.acetaminophen, diltiazem, guaiFENesin-dextromethorphan, levalbuterol, magnesium hydroxide, metoprolol, ondansetron (ZOFRAN) IV  Antibiotics  :     Anti-infectives    None        Objective:   Filed Vitals:   04/30/15 2111 05/01/15 0015 05/01/15 0335 05/01/15 0751  BP: 111/77 91/62 114/69 113/63  Pulse:    83  Temp:  98 F (36.7 C) 98.1 F (36.7 C) 98.4 F (36.9 C)  TempSrc:  Oral Oral Oral  Resp:   18 18  Height:      Weight:   65.862 kg (145 lb 3.2 oz)   SpO2:   96% 94%    Wt Readings from Last 3 Encounters:  05/01/15 65.862 kg (145 lb 3.2 oz)  02/14/15 65.998 kg (145 lb 8 oz)  12/20/14 65.908 kg (145 lb 4.8 oz)     Intake/Output Summary (Last 24 hours) at 05/01/15 0816 Last data filed at 05/01/15 0600  Gross per 24 hour  Intake 919.88 ml  Output      0 ml  Net 919.88 ml     Physical Exam  Awake Alert, Oriented X 3, No new F.N deficits, Mild dysarthria, Normal affect .AT,PERRAL Supple Neck,No JVD, No cervical lymphadenopathy appriciated.  Symmetrical Chest wall movement, Good air movement bilaterally, CTAB RRR,No Gallops,Rubs or new Murmurs, No Parasternal Heave +ve B.Sounds, Abd Soft, No tenderness, No organomegaly appriciated, No rebound - guarding or rigidity. No  Cyanosis, Clubbing or edema, No new Rash or bruise      Data Review:   Micro Results Recent Results (from the past 240 hour(s))  Urine culture     Status: None   Collection Time: 04/28/15 11:14 AM  Result Value Ref Range Status   Specimen Description URINE, RANDOM  Final   Special Requests NONE  Final   Culture 3,000 COLONIES/mL INSIGNIFICANT GROWTH  Final   Report Status 04/29/2015 FINAL  Final    Radiology Reports Ct Angio Head W/cm &/or Wo Cm  04/29/2015  CLINICAL DATA:  Multi focal areas of acute nonhemorrhagic right MCA territory infarct. EXAM: CT ANGIOGRAPHY HEAD AND NECK TECHNIQUE: Multidetector CT imaging of the head and neck was performed using the standard protocol during bolus administration of intravenous contrast. Multiplanar CT image reconstructions and MIPs were obtained to evaluate the vascular anatomy. Carotid stenosis measurements (when applicable) are  obtained utilizing NASCET criteria, using the distal internal carotid diameter as the denominator. CONTRAST:  81m OMNIPAQUE IOHEXOL 350 MG/ML SOLN COMPARISON:  MRI brain 04/28/2015. FINDINGS: CT HEAD Brain: The area of acute infarction involving the posterior right insular cortex and sylvian fissure is not well visualized on either the noncontrast or source images. No other acute infarcts are present. Mild atrophy and white matter disease is again noted. Calvarium and skull base: Within normal limits. Paranasal sinuses: Clear. Orbits: The globes and orbits are intact. CTA NECK Aortic arch: A 3 vessel arch configuration is present. No significant atherosclerotic changes or stenosis are present at the level of the arch. Right carotid system: The right common carotid artery is within normal limits. Minimal atherosclerotic changes are present at the right carotid bifurcation and proximal right ICA without a significant stenosis. The more distal cervical right ICA is normal. Left carotid system: The left common carotid artery is mildly  tortuous. Atherosclerotic irregularity is present at the left carotid bifurcation. There is no significant stenosis at the carotid bifurcation or within the cervical left ICA. Vertebral arteries:Mild narrowing of less than 50% is present at the origin of the right vertebral artery. Both vertebral arteries originate from the subclavian arteries. The left vertebral artery is slightly dominant to the right. There is tortuosity in both vertebral arteries without a significant focal stenosis or vessel injury in the neck. Skeleton: There straightening and slight reversal of the normal cervical lordosis. Slight degenerative anterolisthesis is present at C2-3 and C3-4. There is chronic loss of disc height with endplate change in uncovertebral spurring at C4-5, C5-6, and C6-7 Other neck: A 13 mm hypodense nodule is present in the right lobe of the thyroid. There is mild heterogeneity throughout the thyroid. No other focal lesions are present. No focal mucosal or submucosal lesions are present. The salivary glands are within normal limits bilaterally. No significant adenopathy is present. Bilateral pleural effusions are present. There is associated atelectasis. Scattered ground-glass attenuation reflects edema or atelectasis. CTA HEAD Anterior circulation: Atherosclerotic changes are present within the cavernous internal carotid arteries bilaterally. There is no significant focal stenosis. The supraclinoid internal carotid artery is normal bilaterally. The A1 and M1 segments are within normal limits. The anterior communicating artery is patent. The MCA bifurcations are unremarkable. The ACA and MCA branch vessels are within normal limits. Posterior circulation: The left vertebral artery is the dominant vessel. The vertebrobasilar junction is within normal limits. The PICA origins are visualized and normal bilaterally. The left posterior cerebral artery originates from the basilar tip. The right posterior cerebral artery is of  fetal type. The PCA branch vessels are within normal limits bilaterally. Venous sinuses: The dural sinuses are patent. The right transverse sinus is dominant. The straight sinus and deep cerebral veins are intact. The cortical veins are unremarkable. Anatomic variants: Fetal type posterior artery on the right. Delayed phase: The postcontrast images demonstrate no pathologic enhancement. The posterior right insular cortex infarct is better visualized. IMPRESSION: 1. Stable appearance of posterior right insular cortex infarct. No new infarcts or significant progression are evident. 2. Atherosclerotic changes at the carotid bifurcations bilaterally without significant stenosis. 3. Less than 50% narrowing of the proximal right vertebral artery. 4. Atherosclerotic changes within the cavernous internal carotid arteries bilaterally without focal stenosis. Electronically Signed   By: CSan MorelleM.D.   On: 04/29/2015 15:45   Dg Chest 2 View  04/29/2015  CLINICAL DATA:  Admitted Friday for Afib and rapid heart rate. No chest complaints  today. EXAM: CHEST - 2 VIEW COMPARISON:  the previous day's study FINDINGS: Heart size upper limits normal as before. Slightly improved aeration. Patchy airspace opacities in the left lower lung. Coarse interstitial markings with some improvement in the interstitial edema or infiltrate seen previously. Surgical clips and staple lines left mid lung. No pneumothorax. No significant effusion. Spondylitic changes   in the lower thoracic and upper lumbar spine. IMPRESSION: 1. Improving aeration. Persistent patchy opacities at the left lung base. Electronically Signed   By: Lucrezia Europe M.D.   On: 04/29/2015 11:07   Dg Chest 2 View  04/28/2015  CLINICAL DATA:  Shortness of breath today EXAM: CHEST  2 VIEW COMPARISON:  04/27/2015 FINDINGS: Bibasilar atelectasis. Small left pleural effusion. Heart is borderline in size. Postoperative changes in the left breast/axilla and left hilum.  Improving interstitial prominence. IMPRESSION: Improving interstitial edema pattern. Bibasilar atelectasis with small left effusion. Electronically Signed   By: Rolm Baptise M.D.   On: 04/28/2015 07:25   Dg Chest 2 View  04/27/2015  CLINICAL DATA:  75 year old current history of asthma presenting with acute onset of shortness of breath and chest heaviness which began yesterday. Personal history of left upper lobe lung cancer post resection in 2007 and personal history of left breast cancer post lumpectomy in 2016. EXAM: CHEST  2 VIEW COMPARISON:  12/18/2011 and earlier. FINDINGS: Cardiac silhouette upper normal in size to slightly enlarged, unchanged. Thoracic aorta mildly atherosclerotic and tortuous, unchanged. Hilar and mediastinal contours otherwise unremarkable. Interstitial opacities throughout both lungs associated with small bilateral pleural effusions. Mild atelectasis in the lower lobes. Postsurgical changes in the lingula and left hilum. Surgical clips in the left breast and left axilla prior lumpectomy and node dissection. Degenerative changes throughout the thoracic and lumbar spine with thoracolumbar scoliosis convex left. IMPRESSION: Mild diffuse interstitial pulmonary edema and small bilateral pleural effusions likely indicating mild CHF. However, lymphangitic carcinomatosis can have a similar appearance. Electronically Signed   By: Evangeline Dakin M.D.   On: 04/27/2015 11:15   Ct Angio Neck W/cm &/or Wo/cm  04/29/2015  CLINICAL DATA:  Multi focal areas of acute nonhemorrhagic right MCA territory infarct. EXAM: CT ANGIOGRAPHY HEAD AND NECK TECHNIQUE: Multidetector CT imaging of the head and neck was performed using the standard protocol during bolus administration of intravenous contrast. Multiplanar CT image reconstructions and MIPs were obtained to evaluate the vascular anatomy. Carotid stenosis measurements (when applicable) are obtained utilizing NASCET criteria, using the distal internal  carotid diameter as the denominator. CONTRAST:  40m OMNIPAQUE IOHEXOL 350 MG/ML SOLN COMPARISON:  MRI brain 04/28/2015. FINDINGS: CT HEAD Brain: The area of acute infarction involving the posterior right insular cortex and sylvian fissure is not well visualized on either the noncontrast or source images. No other acute infarcts are present. Mild atrophy and white matter disease is again noted. Calvarium and skull base: Within normal limits. Paranasal sinuses: Clear. Orbits: The globes and orbits are intact. CTA NECK Aortic arch: A 3 vessel arch configuration is present. No significant atherosclerotic changes or stenosis are present at the level of the arch. Right carotid system: The right common carotid artery is within normal limits. Minimal atherosclerotic changes are present at the right carotid bifurcation and proximal right ICA without a significant stenosis. The more distal cervical right ICA is normal. Left carotid system: The left common carotid artery is mildly tortuous. Atherosclerotic irregularity is present at the left carotid bifurcation. There is no significant stenosis at the carotid bifurcation or within the  cervical left ICA. Vertebral arteries:Mild narrowing of less than 50% is present at the origin of the right vertebral artery. Both vertebral arteries originate from the subclavian arteries. The left vertebral artery is slightly dominant to the right. There is tortuosity in both vertebral arteries without a significant focal stenosis or vessel injury in the neck. Skeleton: There straightening and slight reversal of the normal cervical lordosis. Slight degenerative anterolisthesis is present at C2-3 and C3-4. There is chronic loss of disc height with endplate change in uncovertebral spurring at C4-5, C5-6, and C6-7 Other neck: A 13 mm hypodense nodule is present in the right lobe of the thyroid. There is mild heterogeneity throughout the thyroid. No other focal lesions are present. No focal  mucosal or submucosal lesions are present. The salivary glands are within normal limits bilaterally. No significant adenopathy is present. Bilateral pleural effusions are present. There is associated atelectasis. Scattered ground-glass attenuation reflects edema or atelectasis. CTA HEAD Anterior circulation: Atherosclerotic changes are present within the cavernous internal carotid arteries bilaterally. There is no significant focal stenosis. The supraclinoid internal carotid artery is normal bilaterally. The A1 and M1 segments are within normal limits. The anterior communicating artery is patent. The MCA bifurcations are unremarkable. The ACA and MCA branch vessels are within normal limits. Posterior circulation: The left vertebral artery is the dominant vessel. The vertebrobasilar junction is within normal limits. The PICA origins are visualized and normal bilaterally. The left posterior cerebral artery originates from the basilar tip. The right posterior cerebral artery is of fetal type. The PCA branch vessels are within normal limits bilaterally. Venous sinuses: The dural sinuses are patent. The right transverse sinus is dominant. The straight sinus and deep cerebral veins are intact. The cortical veins are unremarkable. Anatomic variants: Fetal type posterior artery on the right. Delayed phase: The postcontrast images demonstrate no pathologic enhancement. The posterior right insular cortex infarct is better visualized. IMPRESSION: 1. Stable appearance of posterior right insular cortex infarct. No new infarcts or significant progression are evident. 2. Atherosclerotic changes at the carotid bifurcations bilaterally without significant stenosis. 3. Less than 50% narrowing of the proximal right vertebral artery. 4. Atherosclerotic changes within the cavernous internal carotid arteries bilaterally without focal stenosis. Electronically Signed   By: San Morelle M.D.   On: 04/29/2015 15:45   [name]  Contrast  04/28/2015  CLINICAL DATA:  Chest pain.  Confusion.  Breast cancer. EXAM: MRI HEAD WITHOUT CONTRAST TECHNIQUE: Multiplanar, multiecho pulse sequences of the brain and surrounding structures were obtained without intravenous contrast. COMPARISON:  MR brain 05/16/2004. FINDINGS: Small foci of restricted diffusion are seen in the RIGHT parietal cortex, RIGHT insula, and RIGHT temporal operculum, consistent with multifocal areas of acute infarction. Although they are clustered within the same vascular distribution (RIGHT ICA/MCA), shower of emboli is suggested as the distribution is not typical of a watershed insult. No other areas infarction are seen. No hemorrhage, mass lesion, hydrocephalus, or extra-axial fluid. Moderate atrophy. T2 and FLAIR hyperintensity throughout the periventricular and subcortical white matter, likely chronic microvascular ischemic change. LEFT parietal subcortical infarct was present 2006. Flow voids are maintained throughout the carotid, basilar, and vertebral arteries. There are no areas of chronic hemorrhage. No intracranial large vessel occlusion. Normal pituitary and cerebellar tonsils. Advanced cervical spondylosis at C2-3, C3-4, and C4-5. Cord flattening may be present at the C4-5 level, similar to priors. There is no sinus disease. Negative orbits. 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 IMPRESSION: Multifocal subcentimeter areas of acute infarction affect the RIGHT MCA territory. Shower of emboli is suspected. No hemorrhage or significant mass effect. Chronic changes of atrophy and white matter disease as described. No MR evidence for large vessel occlusion. Cervical spondylosis, most severe at C4-C5. Electronically Signed   By: Staci Righter M.D.   On: 04/28/2015 10:33   Nm Myocar Multi W/spect W/wall Motion / Ef  04/30/2015  CLINICAL DATA:  Chest pain since last Tuesday.   Shortness of breath. EXAM: MYOCARDIAL IMAGING WITH SPECT (REST AND PHARMACOLOGIC-STRESS) GATED LEFT VENTRICULAR WALL MOTION STUDY LEFT VENTRICULAR EJECTION FRACTION TECHNIQUE: Standard myocardial SPECT imaging was performed after resting intravenous injection of 10 mCi [id number]. Subsequently, intravenous infusion of Lexiscan was performed under the supervision of the Cardiology staff. At peak effect of the drug, 30 mCi [id number] was injected intravenously and standard myocardial SPECT imaging was performed. Quantitative gated imaging was also performed to evaluate left ventricular wall motion, and estimate left ventricular ejection fraction. COMPARISON:  None. FINDINGS: Perfusion: No decreased activity in the left ventricle on stress imaging to suggest reversible ischemia or infarction. There is a combination of mild breast attenuation and apical thinning. Wall Motion: Normal left ventricular wall motion. No left ventricular dilation. Left Ventricular Ejection Fraction: 60 % End diastolic volume 47 ml End systolic volume 19 ml IMPRESSION: 1. No reversible ischemia or infarction. 2. Normal left ventricular wall motion. 3. Left ventricular ejection fraction 60% 4. Low-risk stress test findings*. *2012 Appropriate Use Criteria for Coronary Revascularization Focused Update: J Am Coll Cardiol. 203532;99(2):[email or web address]?articleid=[email or web address] Electronically Signed   By: P.Marijo Sanes.D.   On: 04/30/2015 12:45     CBC  Recent Labs Lab 04/27/15 1140 04/29/15 0510 04/30/15 0610 05/01/15 0420  WBC 8.0 11.1* 9.9 6.6  HGB 13.9 13.1 14.7 13.4  HCT 41.3 39.8 44.5 40.6  PLT 202 195 265 229  MCV 94.9 94.5 95.7 95.5  MCH 32.0 31.1 31.6 31.5  MCHC 33.7 32.9 33.0 33.0  RDW 12.8 13.0 13.2 13.3    Chemistries   Recent Labs Lab 04/27/15 1140 04/28/15 0627 04/29/15 0510 04/30/15 0610 05/01/15 0420  NA 140 143 140 141 142  K 4.2 4.2 3.9 3.4* 4.2  CL 110 107  107 104 111  CO2 21* '25 22 24 22  '$ GLUCOSE 92 157* 112* 127* 116*  BUN 18 22* 23* 19 21*  CREATININE 0.91 1.17* 1.01* 1.10* 1.20*  CALCIUM 8.7* 9.2 8.7* 9.0 8.9  MG  --  2.1  --   --  2.1   ------------------------------------------------------------------------------------------------------------------  Recent Labs  04/29/15 0510  CHOL 167  HDL 57  LDLCALC 95  TRIG 75  CHOLHDL 2.9    Lab Results  Component Value Date   HGBA1C 5.7* 04/29/2015   ------------------------------------------------------------------------------------------------------------------ No results for input(s): TSH, T4TOTAL, T3FREE, THYROIDAB in the last 72 hours.  Invalid input(s): FREET3 ------------------------------------------------------------------------------------------------------------------ No results for input(s): VITAMINB12, FOLATE, FERRITIN, TIBC, IRON, RETICCTPCT in the last 72 hours.  Coagulation profile  Recent Labs Lab 04/27/15 1520  INR 1.04    No results for input(s): DDIMER in the last 72 hours.  Cardiac Enzymes  Recent Labs Lab 04/28/15 0806 04/28/15 1502 04/28/15 2011  TROPONINI <0.03 <0.03 <0.03   ------------------------------------------------------------------------------------------------------------------    Component Value Date/Time   BNP 404.7* 04/27/2015 1140    Time Spent in minutes  30   SINGH,PRASHANT K M.D on 05/01/2015 at 8:16 AM  Between 7am to 7pm -  Pager - [phone]  After 7pm go to [email or web address] - password [facility]  [facility] -  Office  [phone]

## 2015-05-02 LAB — CBC
HCT: 42.5 % (ref 36.0–46.0)
Hemoglobin: 13.9 g/dL (ref 12.0–15.0)
MCH: 31.3 pg (ref 26.0–34.0)
MCHC: 32.7 g/dL (ref 30.0–36.0)
MCV: 95.7 fL (ref 78.0–100.0)
PLATELETS: 244 10*3/uL (ref 150–400)
RBC: 4.44 MIL/uL (ref 3.87–5.11)
RDW: 13 % (ref 11.5–15.5)
WBC: 7.2 10*3/uL (ref 4.0–10.5)

## 2015-05-02 MED ORDER — ROSUVASTATIN CALCIUM 40 MG PO TABS: 40.0000 mg | ORAL_TABLET | Freq: Every day | ORAL | Status: DC

## 2015-05-02 MED ORDER — DILTIAZEM HCL ER COATED BEADS 180 MG PO CP24: 180.0000 mg | ORAL_CAPSULE | Freq: Every day | ORAL | Status: DC

## 2015-05-02 MED ORDER — APIXABAN 5 MG PO TABS: 5.0000 mg | ORAL_TABLET | Freq: Two times a day (BID) | ORAL | Status: DC

## 2015-05-02 MED ORDER — SIMVASTATIN 80 MG PO TABS: 80.0000 mg | ORAL_TABLET | Freq: Every day | ORAL | Status: DC

## 2015-05-02 MED ORDER — METOPROLOL TARTRATE 25 MG PO TABS: 25.0000 mg | ORAL_TABLET | Freq: Two times a day (BID) | ORAL | Status: DC

## 2015-05-02 MED ORDER — DILTIAZEM HCL ER COATED BEADS 180 MG PO CP24
180.0000 mg | ORAL_CAPSULE | Freq: Every day | ORAL | Status: DC
  Administered 2015-05-02: 180 mg via ORAL
  Filled 2015-05-02: qty 1

## 2015-05-02 MED ORDER — DIGOXIN 125 MCG PO TABS: 0.1250 mg | ORAL_TABLET | Freq: Every day | ORAL | Status: DC

## 2015-05-02 NOTE — Discharge Summary (Signed)
Physician Discharge Summary  Patricia [name] KGU:[id number] DOB: [birth year] DOA: 04/27/2015  PCP: Annye Asa, MD  Admit date: 04/27/2015 Discharge date: 05/02/2015  Time spent:55 minutes  Recommendations for Outpatient Follow-up:  Repeat 2 view chest x-ray in 7-10 days to ensure clearing of b/l basilar infiltrates   Discharge Condition: stable    Discharge Diagnoses:  Principal Problem:   Chest pain Active Problems:   HYPERTENSION, BENIGN ESSENTIAL   GERD   CHEST PAIN   Hyperthyroidism   Pulmonary edema   Asthma   Paroxysmal atrial fibrillation (HCC)   CVA (cerebral infarction)   Acute CVA (cerebrovascular accident) ([place])   Persistent atrial fibrillation ([place])   History of present illness:  Pleasant 75yrold caucasian female with H/O lung cancer s/p resection 2003, and ER+ breast Ca, s/p lumpectomy on observation after Tamoxifen and radiation, last in 11/2014, HTN, hyperlipidemia, hypothyroidism, presenting with 1 day history of substernal chest pain described as 6/10, she ruled out for MI, but was found to have new Afib and CVA upon admission.  Hospital Course:  1. Chest pain- cause uncertain ? Due to A-fib with RVR - Ruled out for MI, she is now pain-free, on beta blocker and statin, -ve Myoview stress test on 04/29/2015.  2. Paroxysmal atrial fibrillation diagnosed this admission. - CMalivasc 2 score of 4 above - plan was discussed with both cardiology and neurology, was on heparin drip and now on Eliquis -  IV Cardizem drip transitioned to oral Cardizem- cont PO Lopressor as well  3. 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  -Likely due to undiagnosed Afib, Neuro consulted, stroke protocol initiated, initially on Heparin now switched to Eliquis - Neuro recommendeds to stop ASA 81 mg - LDL at 95 was above goal - attempted to  switch to from Zocor 40 mg to Crestor 40 mg but insurance is not covering it -will increase Zocor to 80 mg for  now - A1c was 5.7. CT angiogram head and neck non acute. Echo stable. Symptoms almost resolved.  4. Mild asthma exacerbation upon admission. Resolved. Prednisone stopped on 2/ 26  5. Hypothyroidism. Continue supplementation.  6. Dyslipidemia. On statin continue. Repeat lipid panel pending.  7. GERD. On PPI continue.  8. Essential hypertension. Continue present regimen.  9. History of carcinoid lung cancer and breast cancer. Continue follow-up with Dr. MJana Hakim Repeat 2 view chest x-ray in 7-10 days.    Procedures: MRI brain. Right MCA territory embolic infarcts.  Echogram   Left ventricle: The cavity size was normal. Systolic function wasnormal. The estimated ejection fraction was in the range of 55%to 60%. Wall motion was normal; there were no regional wallmotion abnormalities. The study is not technically sufficient toallow evaluation of LV diastolic function. Doppler parameters areconsistent with high ventricular filling pressure. - Aortic valve: Transvalvular velocity was within the normal range.There was no stenosis. There was mild to moderate regurgitation.Regurgitation pressure half-time: 369 ms. - Mitral valve: Transvalvular velocity was within the normal range.There was no evidence for stenosis. There was trivialregurgitation. - Left atrium: The atrium was severely dilated. - Right ventricle: The cavity size was normal. Wall thickness wasnormal. Systolic function was normal. - Atrial septum: No defect or patent foramen ovale was identifiedby color flow Doppler. - Tricuspid valve: There was mild regurgitation. - Inferior vena cava: The vessel was normal in size. Therespirophasic diameter changes were in the normal range (>= 50%),consistent with normal central venous pressure  CT angiogram head and neck  Myoview Stress  test 04/29/2015 - Negative    Consultations:  Neurology  Cardiology   Discharge Exam: Filed Weights   04/30/15 0523 05/01/15 0335  05/02/15 0522  Weight: 65.817 kg (145 lb 1.6 oz) 65.862 kg (145 lb 3.2 oz) 64.91 kg (143 lb 1.6 oz)   Filed Vitals:   05/02/15 1100 05/02/15 1248  BP: 106/72 116/65  Pulse:  76  Temp:  97.4 F (36.3 C)  Resp:      General: AAO x 3, no distress Cardiovascular: RRR, no murmurs  Respiratory: clear to auscultation bilaterally GI: soft, non-tender, non-distended, bowel sound positive  Discharge Instructions You were cared for by a hospitalist during your hospital stay. If you have any questions about your discharge medications or the care you received while you were in the hospital after you are discharged, you can call the unit and asked to speak with the hospitalist on call if the hospitalist that took care of you is not available. Once you are discharged, your primary care physician will handle any further medical issues. Please note that NO REFILLS for any discharge medications will be authorized once you are discharged, as it is imperative that you return to your primary care physician (or establish a relationship with a primary care physician if you do not have one) for your aftercare needs so that they can reassess your need for medications and monitor your lab values.      Discharge Instructions    Ambulatory referral to Neurology    Complete by:  As directed   Follow up with carolyn martin NP at [facility] in one month. Thanks.     Diet - low sodium heart healthy    Complete by:  As directed      Increase activity slowly    Complete by:  As directed             Medication List    STOP taking these medications        aspirin EC 81 MG tablet      TAKE these medications        albuterol 108 (90 Base) MCG/ACT inhaler  Commonly known as:  VENTOLIN HFA  Inhale 2 puffs into the lungs every 4 (four) hours as needed.     apixaban 5 MG Tabs tablet  Commonly known as:  ELIQUIS  Take 1 tablet (5 mg total) by mouth 2 (two) times daily.     ARMOUR THYROID 30 MG tablet  Generic  drug:  thyroid  Take 1 tablet (30 mg total) by mouth daily before breakfast.     digoxin 0.125 MG tablet  Commonly known as:  LANOXIN  Take 1 tablet (0.125 mg total) by mouth daily.     diltiazem 180 MG 24 hr capsule  Commonly known as:  CARDIZEM CD  Take 1 capsule (180 mg total) by mouth daily.     dorzolamide 2 % ophthalmic solution  Commonly known as:  TRUSOPT  Place 1 drop into both eyes 2 (two) times daily.     fluticasone-salmeterol 230-21 MCG/ACT inhaler  Commonly known as:  ADVAIR HFA  INHALE 2 PUFFS INTO THE LUNGS TWICE A DAY     gabapentin 400 MG capsule  Commonly known as:  NEURONTIN  Take 1 capsule (400 mg total) by mouth 2 (two) times daily.     loratadine 10 MG tablet  Commonly known as:  CLARITIN  Take 1 tablet (10 mg total) by mouth daily.     LUMIGAN 0.03 % ophthalmic  solution  Generic drug:  bimatoprost  Place 1 drop into both eyes at bedtime.     metoprolol tartrate 25 MG tablet  Commonly known as:  LOPRESSOR  Take 1 tablet (25 mg total) by mouth 2 (two) times daily.     Multivitamin/Iron Tabs  Take 1 tablet by mouth daily.     omeprazole 20 MG capsule  Commonly known as:  PRILOSEC  TAKE 1 CAPSULE DAILY 30 MINUTES BEFORE A MEAL     simvastatin 80 MG tablet  Commonly known as:  ZOCOR  Take 1 tablet (80 mg total) by mouth at bedtime.     tamoxifen 20 MG tablet  Commonly known as:  NOLVADEX  Take 1 tablet (20 mg total) by mouth daily.     traMADol 50 MG tablet  Commonly known as:  ULTRAM  Take 1 tablet (50 mg total) by mouth every 6 (six) hours as needed. for pain     valsartan 40 MG tablet  Commonly known as:  DIOVAN  TAKE 1 TABLET DAILY       Allergies  Allergen Reactions  . Combigan [Brimonidine Tartrate-Timolol]     Eyes itch, reddened  . Sulfa Antibiotics Rash   Follow-up Information    Follow up with Dennie Bible, NP. Call in 1 month.   Specialty:  Family Medicine   Why:  stroke clinic   Contact information:   [address] [place] [address] [phone]       Follow up with Eileen Stanford, PA-C On 05/08/2015.   Specialties:  Cardiology, Radiology   Why:  9:00 am (cardiology follow-up).   Contact information:   [address] [place] [address] [phone]        The results of significant diagnostics from this hospitalization (including imaging, microbiology, ancillary and laboratory) are listed below for reference.    Significant Diagnostic Studies: Ct Angio Head W/cm &/or Wo Cm  04/29/2015  CLINICAL DATA:  Multi focal areas of acute nonhemorrhagic right MCA territory infarct. EXAM: CT ANGIOGRAPHY HEAD AND NECK TECHNIQUE: Multidetector CT imaging of the head and neck was performed using the standard protocol during bolus administration of intravenous contrast. Multiplanar CT image reconstructions and MIPs were obtained to evaluate the vascular anatomy. Carotid stenosis measurements (when applicable) are obtained utilizing NASCET criteria, using the distal internal carotid diameter as the denominator. CONTRAST:  48m OMNIPAQUE IOHEXOL 350 MG/ML SOLN COMPARISON:  MRI brain 04/28/2015. FINDINGS: CT HEAD Brain: The area of acute infarction involving the posterior right insular cortex and sylvian fissure is not well visualized on either the noncontrast or source images. No other acute infarcts are present. Mild atrophy and white matter disease is again noted. Calvarium and skull base: Within normal limits. Paranasal sinuses: Clear. Orbits: The globes and orbits are intact. CTA NECK Aortic arch: A 3 vessel arch configuration is present. No significant atherosclerotic changes or stenosis are present at the level of the arch. Right carotid system: The right common carotid artery is within normal limits. Minimal atherosclerotic changes are present at the right carotid bifurcation and proximal right ICA without a significant stenosis. The more distal cervical right ICA is  normal. Left carotid system: The left common carotid artery is mildly tortuous. Atherosclerotic irregularity is present at the left carotid bifurcation. There is no significant stenosis at the carotid bifurcation or within the cervical left ICA. Vertebral arteries:Mild narrowing of less than 50% is present at the origin of the right vertebral artery.  Both vertebral arteries originate from the subclavian arteries. The left vertebral artery is slightly dominant to the right. There is tortuosity in both vertebral arteries without a significant focal stenosis or vessel injury in the neck. Skeleton: There straightening and slight reversal of the normal cervical lordosis. Slight degenerative anterolisthesis is present at C2-3 and C3-4. There is chronic loss of disc height with endplate change in uncovertebral spurring at C4-5, C5-6, and C6-7 Other neck: A 13 mm hypodense nodule is present in the right lobe of the thyroid. There is mild heterogeneity throughout the thyroid. No other focal lesions are present. No focal mucosal or submucosal lesions are present. The salivary glands are within normal limits bilaterally. No significant adenopathy is present. Bilateral pleural effusions are present. There is associated atelectasis. Scattered ground-glass attenuation reflects edema or atelectasis. CTA HEAD Anterior circulation: Atherosclerotic changes are present within the cavernous internal carotid arteries bilaterally. There is no significant focal stenosis. The supraclinoid internal carotid artery is normal bilaterally. The A1 and M1 segments are within normal limits. The anterior communicating artery is patent. The MCA bifurcations are unremarkable. The ACA and MCA branch vessels are within normal limits. Posterior circulation: The left vertebral artery is the dominant vessel. The vertebrobasilar junction is within normal limits. The PICA origins are visualized and normal bilaterally. The left posterior cerebral artery  originates from the basilar tip. The right posterior cerebral artery is of fetal type. The PCA branch vessels are within normal limits bilaterally. Venous sinuses: The dural sinuses are patent. The right transverse sinus is dominant. The straight sinus and deep cerebral veins are intact. The cortical veins are unremarkable. Anatomic variants: Fetal type posterior artery on the right. Delayed phase: The postcontrast images demonstrate no pathologic enhancement. The posterior right insular cortex infarct is better visualized. IMPRESSION: 1. Stable appearance of posterior right insular cortex infarct. No new infarcts or significant progression are evident. 2. Atherosclerotic changes at the carotid bifurcations bilaterally without significant stenosis. 3. Less than 50% narrowing of the proximal right vertebral artery. 4. Atherosclerotic changes within the cavernous internal carotid arteries bilaterally without focal stenosis. Electronically Signed   By: San Morelle M.D.   On: 04/29/2015 15:45   Dg Chest 2 View  04/29/2015  CLINICAL DATA:  Admitted Friday for Afib and rapid heart rate. No chest complaints today. EXAM: CHEST - 2 VIEW COMPARISON:  the previous day's study FINDINGS: Heart size upper limits normal as before. Slightly improved aeration. Patchy airspace opacities in the left lower lung. Coarse interstitial markings with some improvement in the interstitial edema or infiltrate seen previously. Surgical clips and staple lines left mid lung. No pneumothorax. No significant effusion. Spondylitic changes   in the lower thoracic and upper lumbar spine. IMPRESSION: 1. Improving aeration. Persistent patchy opacities at the left lung base. Electronically Signed   By: Lucrezia Europe M.D.   On: 04/29/2015 11:07   Dg Chest 2 View  04/28/2015  CLINICAL DATA:  Shortness of breath today EXAM: CHEST  2 VIEW COMPARISON:  04/27/2015 FINDINGS: Bibasilar atelectasis. Small left pleural effusion. Heart is borderline in  size. Postoperative changes in the left breast/axilla and left hilum. Improving interstitial prominence. IMPRESSION: Improving interstitial edema pattern. Bibasilar atelectasis with small left effusion. Electronically Signed   By: Rolm Baptise M.D.   On: 04/28/2015 07:25   Dg Chest 2 View  04/27/2015  CLINICAL DATA:  75 year old current history of asthma presenting with acute onset of shortness of breath and chest heaviness which began yesterday. Personal history  of left upper lobe lung cancer post resection in 2007 and personal history of left breast cancer post lumpectomy in 2016. EXAM: CHEST  2 VIEW COMPARISON:  12/18/2011 and earlier. FINDINGS: Cardiac silhouette upper normal in size to slightly enlarged, unchanged. Thoracic aorta mildly atherosclerotic and tortuous, unchanged. Hilar and mediastinal contours otherwise unremarkable. Interstitial opacities throughout both lungs associated with small bilateral pleural effusions. Mild atelectasis in the lower lobes. Postsurgical changes in the lingula and left hilum. Surgical clips in the left breast and left axilla prior lumpectomy and node dissection. Degenerative changes throughout the thoracic and lumbar spine with thoracolumbar scoliosis convex left. IMPRESSION: Mild diffuse interstitial pulmonary edema and small bilateral pleural effusions likely indicating mild CHF. However, lymphangitic carcinomatosis can have a similar appearance. Electronically Signed   By: Evangeline Dakin M.D.   On: 04/27/2015 11:15   Ct Angio Neck W/cm &/or Wo/cm  04/29/2015  CLINICAL DATA:  Multi focal areas of acute nonhemorrhagic right MCA territory infarct. EXAM: CT ANGIOGRAPHY HEAD AND NECK TECHNIQUE: Multidetector CT imaging of the head and neck was performed using the standard protocol during bolus administration of intravenous contrast. Multiplanar CT image reconstructions and MIPs were obtained to evaluate the vascular anatomy. Carotid stenosis measurements (when  applicable) are obtained utilizing NASCET criteria, using the distal internal carotid diameter as the denominator. CONTRAST:  61m OMNIPAQUE IOHEXOL 350 MG/ML SOLN COMPARISON:  MRI brain 04/28/2015. FINDINGS: CT HEAD Brain: The area of acute infarction involving the posterior right insular cortex and sylvian fissure is not well visualized on either the noncontrast or source images. No other acute infarcts are present. Mild atrophy and white matter disease is again noted. Calvarium and skull base: Within normal limits. Paranasal sinuses: Clear. Orbits: The globes and orbits are intact. CTA NECK Aortic arch: A 3 vessel arch configuration is present. No significant atherosclerotic changes or stenosis are present at the level of the arch. Right carotid system: The right common carotid artery is within normal limits. Minimal atherosclerotic changes are present at the right carotid bifurcation and proximal right ICA without a significant stenosis. The more distal cervical right ICA is normal. Left carotid system: The left common carotid artery is mildly tortuous. Atherosclerotic irregularity is present at the left carotid bifurcation. There is no significant stenosis at the carotid bifurcation or within the cervical left ICA. Vertebral arteries:Mild narrowing of less than 50% is present at the origin of the right vertebral artery. Both vertebral arteries originate from the subclavian arteries. The left vertebral artery is slightly dominant to the right. There is tortuosity in both vertebral arteries without a significant focal stenosis or vessel injury in the neck. Skeleton: There straightening and slight reversal of the normal cervical lordosis. Slight degenerative anterolisthesis is present at C2-3 and C3-4. There is chronic loss of disc height with endplate change in uncovertebral spurring at C4-5, C5-6, and C6-7 Other neck: A 13 mm hypodense nodule is present in the right lobe of the thyroid. There is mild  heterogeneity throughout the thyroid. No other focal lesions are present. No focal mucosal or submucosal lesions are present. The salivary glands are within normal limits bilaterally. No significant adenopathy is present. Bilateral pleural effusions are present. There is associated atelectasis. Scattered ground-glass attenuation reflects edema or atelectasis. CTA HEAD Anterior circulation: Atherosclerotic changes are present within the cavernous internal carotid arteries bilaterally. There is no significant focal stenosis. The supraclinoid internal carotid artery is normal bilaterally. The A1 and M1 segments are within normal limits. The anterior communicating artery  is patent. The MCA bifurcations are unremarkable. The ACA and MCA branch vessels are within normal limits. Posterior circulation: The left vertebral artery is the dominant vessel. The vertebrobasilar junction is within normal limits. The PICA origins are visualized and normal bilaterally. The left posterior cerebral artery originates from the basilar tip. The right posterior cerebral artery is of fetal type. The PCA branch vessels are within normal limits bilaterally. Venous sinuses: The dural sinuses are patent. The right transverse sinus is dominant. The straight sinus and deep cerebral veins are intact. The cortical veins are unremarkable. Anatomic variants: Fetal type posterior artery on the right. Delayed phase: The postcontrast images demonstrate no pathologic enhancement. The posterior right insular cortex infarct is better visualized. IMPRESSION: 1. Stable appearance of posterior right insular cortex infarct. No new infarcts or significant progression are evident. 2. Atherosclerotic changes at the carotid bifurcations bilaterally without significant stenosis. 3. Less than 50% narrowing of the proximal right vertebral artery. 4. Atherosclerotic changes within the cavernous internal carotid arteries bilaterally without focal stenosis.  Electronically Signed   By: San Morelle M.D.   On: 04/29/2015 15:45   [name] Contrast  04/28/2015  CLINICAL DATA:  Chest pain.  Confusion.  Breast cancer. EXAM: MRI HEAD WITHOUT CONTRAST TECHNIQUE: Multiplanar, multiecho pulse sequences of the brain and surrounding structures were obtained without intravenous contrast. COMPARISON:  MR brain 05/16/2004. FINDINGS: Small foci of restricted diffusion are seen in the RIGHT parietal cortex, RIGHT insula, and RIGHT temporal operculum, consistent with multifocal areas of acute infarction. Although they are clustered within the same vascular distribution (RIGHT ICA/MCA), shower of emboli is suggested as the distribution is not typical of a watershed insult. No other areas infarction are seen. No hemorrhage, mass lesion, hydrocephalus, or extra-axial fluid. Moderate atrophy. T2 and FLAIR hyperintensity throughout the periventricular and subcortical white matter, likely chronic microvascular ischemic change. LEFT parietal subcortical infarct was present 2006. Flow voids are maintained throughout the carotid, basilar, and vertebral arteries. There are no areas of chronic hemorrhage. No intracranial large vessel occlusion. Normal pituitary and cerebellar tonsils. Advanced cervical spondylosis at C2-3, C3-4, and C4-5. Cord flattening may be present at the C4-5 level, similar to priors. There is no sinus disease. Negative orbits. 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 IMPRESSION: Multifocal subcentimeter areas of acute infarction affect the RIGHT MCA territory. Shower of emboli is suspected. No hemorrhage or significant mass effect. Chronic changes of atrophy and white matter disease as described. No MR evidence for large vessel occlusion. Cervical spondylosis, most severe at C4-C5. Electronically Signed   By: Staci Righter M.D.   On: 04/28/2015 10:33   Nm Myocar Multi  W/spect W/wall Motion / Ef  04/30/2015  CLINICAL DATA:  Chest pain since last Tuesday.  Shortness of breath. EXAM: MYOCARDIAL IMAGING WITH SPECT (REST AND PHARMACOLOGIC-STRESS) GATED LEFT VENTRICULAR WALL MOTION STUDY LEFT VENTRICULAR EJECTION FRACTION TECHNIQUE: Standard myocardial SPECT imaging was performed after resting intravenous injection of 10 mCi [id number]. Subsequently, intravenous infusion of Lexiscan was performed under the supervision of the Cardiology staff. At peak effect of the drug, 30 mCi [id number] was injected intravenously and standard myocardial SPECT imaging was performed. Quantitative gated imaging was also performed to evaluate left ventricular wall motion, and estimate left ventricular ejection fraction. COMPARISON:  None. FINDINGS: Perfusion: No decreased activity in the left ventricle on stress imaging to suggest reversible ischemia or infarction. There is a combination of  mild breast attenuation and apical thinning. Wall Motion: Normal left ventricular wall motion. No left ventricular dilation. Left Ventricular Ejection Fraction: 60 % End diastolic volume 47 ml End systolic volume 19 ml IMPRESSION: 1. No reversible ischemia or infarction. 2. Normal left ventricular wall motion. 3. Left ventricular ejection fraction 60% 4. Low-risk stress test findings*. *2012 Appropriate Use Criteria for Coronary Revascularization Focused Update: J Am Coll Cardiol. 0355;97(4):163-845. http://content.airportbarriers.com.aspx?articleid=[email or web address] Electronically Signed   By: Marijo Sanes M.D.   On: 04/30/2015 12:45    Microbiology: Recent Results (from the past 240 hour(s))  Urine culture     Status: None   Collection Time: 04/28/15 11:14 AM  Result Value Ref Range Status   Specimen Description URINE, RANDOM  Final   Special Requests NONE  Final   Culture 3,000 COLONIES/mL INSIGNIFICANT GROWTH  Final   Report Status 04/29/2015 FINAL  Final     Labs: Basic Metabolic  Panel:  Recent Labs Lab 04/27/15 1140 04/28/15 0627 04/29/15 0510 04/30/15 0610 05/01/15 0420  NA 140 143 140 141 142  K 4.2 4.2 3.9 3.4* 4.2  CL 110 107 107 104 111  CO2 21* '25 22 24 22  '$ GLUCOSE 92 157* 112* 127* 116*  BUN 18 22* 23* 19 21*  CREATININE 0.91 1.17* 1.01* 1.10* 1.20*  CALCIUM 8.7* 9.2 8.7* 9.0 8.9  MG  --  2.1  --   --  2.1   Liver Function Tests: No results for input(s): AST, ALT, ALKPHOS, BILITOT, PROT, ALBUMIN in the last 168 hours. No results for input(s): LIPASE, AMYLASE in the last 168 hours. No results for input(s): AMMONIA in the last 168 hours. CBC:  Recent Labs Lab 04/27/15 1140 04/29/15 0510 04/30/15 0610 05/01/15 0420 05/02/15 0520  WBC 8.0 11.1* 9.9 6.6 7.2  HGB 13.9 13.1 14.7 13.4 13.9  HCT 41.3 39.8 44.5 40.6 42.5  MCV 94.9 94.5 95.7 95.5 95.7  PLT 202 195 265 229 244   Cardiac Enzymes:  Recent Labs Lab 04/28/15 0806 04/28/15 1502 04/28/15 2011  TROPONINI <0.03 <0.03 <0.03   BNP: BNP (last 3 results)  Recent Labs  04/27/15 1140  BNP 404.7*    ProBNP (last 3 results) No results for input(s): PROBNP in the last 8760 hours.  CBG: No results for input(s): GLUCAP in the last 168 hours.     SignedDebbe Odea, MD [facility] 05/02/2015, 6:00 PM

## 2015-05-02 NOTE — Care Management Note (Signed)
Case Management Note  Patient Details  Name: Patricia [name] MRN: [id number] Date of Birth: November 01, 1940  Subjective/Objective: Pt admitted for Chest Pain. Plan is to return home with support of family.                    Action/Plan: CM did speak with pt and she states she will not need Outpatient Occupational Therapy at this time. CM did relay information to Staff RN Janett Billow. No further needs from CM at this time.    Expected Discharge Date:                  Expected Discharge Plan:  Home/Self Care  In-House Referral:  NA  Discharge planning Services  CM Consult  Post Acute Care Choice:  NA Choice offered to:  NA  DME Arranged:  N/A DME Agency:  NA  HH Arranged:  NA HH Agency:  NA  Status of Service:  Completed, signed off  Medicare Important Message Given:    Date Medicare IM Given:    Medicare IM give by:    Date Additional Medicare IM Given:    Additional Medicare Important Message give by:     If discussed at [place] of Stay Meetings, dates discussed:    Additional Comments:  Bethena Roys, RN 05/02/2015, 11:22 AM

## 2015-05-02 NOTE — Progress Notes (Signed)
    SUBJECTIVE:  No SOB.  No pain.  Walked and doing OK.     PHYSICAL EXAM Filed Vitals:   05/02/15 0522 05/02/15 0809 05/02/15 0821 05/02/15 0843  BP: 95/78 105/81  104/57  Pulse: 74   79  Temp: 97.7 F (36.5 C)   98.5 F (36.9 C)  TempSrc: Oral   Oral  Resp:      Height:      Weight: 143 lb 1.6 oz (64.91 kg)     SpO2: 97%  98% 96%   General:  No distress Lungs:  Clear Heart:  Irregular Abdomen:  Positive bowel sounds, no rebound no guarding Extremities:  No edema  LABS:  Results for orders placed or performed during the hospital encounter of 04/27/15 (from the past 24 hour(s))  CBC     Status: None   Collection Time: 05/02/15  5:20 AM  Result Value Ref Range   WBC 7.2 4.0 - 10.5 K/uL   RBC 4.44 3.87 - 5.11 MIL/uL   Hemoglobin 13.9 12.0 - 15.0 g/dL   HCT 42.5 36.0 - 46.0 %   MCV 95.7 78.0 - 100.0 fL   MCH 31.3 26.0 - 34.0 pg   MCHC 32.7 30.0 - 36.0 g/dL   RDW 13.0 11.5 - 15.5 %   Platelets 244 150 - 400 K/uL    Intake/Output Summary (Last 24 hours) at 05/02/15 0949 Last data filed at 05/02/15 0843  Gross per 24 hour  Intake   1440 ml  Output      0 ml  Net   1440 ml     ASSESSMENT AND PLAN:  CHEST PAIN:  Unclear etiology.  Possibly related to atrial fib with rapid rate.  However, no ischemia on perfusion study.  No further work up.  ATRIAL FIB WITH RVR:   Rate at rest is OK.  OK to go home.  I would change to Cardizem CD 180 mg and low dose beta blocker.  I asked her to get a pulse ox for home use and to let us know if her average heart rate is above 100 and we would need to further adjust.  We will see her within the week to further assess rate control and further plans for atrial fib management.    Jeneen Rinks [facility] 05/02/2015 9:49 AM

## 2015-05-03 ENCOUNTER — Telehealth: Payer: Self-pay | Admitting: *Deleted

## 2015-05-03 NOTE — Telephone Encounter (Signed)
Unable to reach patient at time of TCM call. Left message for patient to return call when available.

## 2015-05-06 NOTE — Progress Notes (Signed)
Cardiology Office Note   Date:  05/08/2015   ID:  Patricia [name], DOB 1940-06-16, MRN [id number]  PCP:  Annye Asa, MD  Cardiologist:  Dr. Radford Pax ( her husband's and son's Dr.)   [place] hospital follow up    History of Present Illness: Patricia [name] is a 75 y.o. female with a history of lung CA s/p resection (2003), breast CA s/p lumpectomy/Tamoxifen/XRT, GERD, hypothyroidism, HTN and HLD who presents to clinic for post hospitalization follow up after a recent admission for chest pain, CHF and afib with RVR as well as acute CVA.   She presented to [facility] on 04/27/15 with chest pain, SOB, orthopnea as well as some confusion and word finding difficulties. 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 CXR with CHF and BNP elevated. She was given a short course of IV lasix with improvement and started on IV heparin gtt. She underwent nuclear stress testing on 04/28/14 which was low risk for ischemia; EF 60%. 2D ECHO showed normal LV function, no RWMA, high ventricular filling pressures, mild-mod AR, severe LA dilation, mild TR. She was eventually transitioned to Eliquis '5mg'$  BID and started on oral lopressor 12.'5mg'$  BID, digoxin 0.'125mg'$  daily and Cardizem '180mg'$  daily. She had some mild renal insufficiency creat did go up from 0.91--> 1.2 during her admission.  Today she presents to clinic for follow up. No residual deficits from recent CVA. She just notes that she has generalized fatigue. But no chest pain, shortness of breath, lower extremity edema, palpitations, orthopnea, PND. No dizziness or syncope. She has been taking all of her medicines as prescribed.    Past Medical History  Diagnosis Date  . Cough variant asthma   . Osteopenia   . Stricture and stenosis of cervix   . Postmenopausal   . GERD (gastroesophageal reflux disease)   . Disease of pharynx or nasopharynx   . Elevated BP   . Neoplasm   . Personal  history of malignant neoplasm of bronchus and lung   . Allergic rhinitis, cause unspecified   . Diverticulosis   . Internal hemorrhoids   . Hiatal hernia   . Hypertension   . Laryngospasm   . 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 Hyperlipidemia   . Glaucoma   . Laryngospasm   . Multinodular goiter   . Chronic cough   . 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 Radiation 10/22/14-11/23/14    Left Breast  . CHF (congestive heart failure) [facility])     Past Surgical History  Procedure Laterality Date  . Lung cancer surgery  2003    resection carcinoid lingula-lt upper lobe  . Colonoscopy    . Radioactive seed guided mastectomy with axillary sentinel lymph node biopsy Left 06/26/2014    Procedure: RADIOACTIVE SEED GUIDED PARTIAL MASTECTOMY WITH AXILLARY SENTINEL LYMPH NODE BIOPSY;  Surgeon: Stark Klein, MD;  Location: [place];  Service: General;  Laterality: Left;  . Breast surgery    . Re-excision of breast lumpectomy Left 08/07/2014    Procedure: RE-EXCISION OF LEFT BREAST LUMPECTOMY;  Surgeon: Stark Klein, MD;  Location: [place];  Service: General;  Laterality: Left;     Current Outpatient Prescriptions  Medication Sig Dispense Refill  . albuterol (PROVENTIL HFA;VENTOLIN HFA) 108 (90 Base) MCG/ACT inhaler Inhale 2 puffs into the lungs every 6 (six) hours as needed  for wheezing or shortness of breath.    Marland Kitchen apixaban (ELIQUIS) 5 MG TABS tablet Take 1 tablet (5 mg total) by mouth 2 (two) times daily. 180 tablet 3  . ARMOUR THYROID 30 MG tablet Take 1 tablet (30 mg total) by mouth daily before breakfast. 90 tablet 1  . bimatoprost (LUMIGAN) 0.03 % ophthalmic drops Place 1 drop into both eyes at bedtime.     . digoxin (LANOXIN) 0.125 MG tablet Take 1 tablet (0.125 mg total) by mouth daily. 90 tablet 3  . diltiazem (CARDIZEM CD) 180 MG 24 hr capsule Take 1 capsule (180 mg total) by mouth daily. 30 capsule 0  . dorzolamide (TRUSOPT) 2 %  ophthalmic solution Place 1 drop into both eyes 2 (two) times daily.    . fluticasone-salmeterol (ADVAIR HFA) 230-21 MCG/ACT inhaler INHALE 2 PUFFS INTO THE LUNGS TWICE A DAY 3 Inhaler 2  . gabapentin (NEURONTIN) 400 MG capsule Take 1 capsule (400 mg total) by mouth 2 (two) times daily. 180 capsule 1  . loratadine (CLARITIN) 10 MG tablet Take 10 mg by mouth daily as needed for allergies.    . metoprolol tartrate (LOPRESSOR) 25 MG tablet Take 1 tablet (25 mg total) by mouth 2 (two) times daily. 180 tablet 3  . Multiple Vitamins-Iron (MULTIVITAMIN/IRON) TABS Take 1 tablet by mouth daily.     Marland Kitchen omeprazole (PRILOSEC) 20 MG capsule TAKE 1 CAPSULE DAILY 30 MINUTES BEFORE A MEAL 90 capsule 1  . simvastatin (ZOCOR) 40 MG tablet Take 1 tablet (40 mg total) by mouth at bedtime. 90 tablet 3  . tamoxifen (NOLVADEX) 20 MG tablet Take 1 tablet (20 mg total) by mouth daily. 90 tablet 4  . traMADol (ULTRAM) 50 MG tablet Take 1 tablet (50 mg total) by mouth every 6 (six) hours as needed. for pain 45 tablet 0   No current facility-administered medications for this visit.    Allergies:   Combigan and Sulfa antibiotics    Social History:  The patient  reports that she quit smoking about 51 years ago. She does not have any smokeless tobacco history on file. She reports that she drinks alcohol. She reports that she does not use illicit drugs.   Family History:  The patient's family history includes Hyperlipidemia in her father and mother; Hypertension in her father and mother; Stroke in her father and mother; Transient ischemic attack in her father. There is no history of Heart attack.    ROS:  Please see the history of present illness.   Otherwise, review of systems are positive for none.   All other systems are reviewed and negative.    PHYSICAL EXAM: VS:  BP 90/60 mmHg  Pulse 68  Ht '4\' 11"'$  (1.499 m)  Wt 144 lb 12.8 oz (65.681 kg)  BMI 29.23 kg/m2 , BMI Body mass index is 29.23 kg/(m^2). GEN: Well  nourished, well developed, in no acute distressOverweight HEENT: normal Neck: no JVD, carotid bruits, or masses Cardiac: irreg irreg; no murmurs, rubs, or gallops,no edema  Respiratory:  clear to auscultation bilaterally, normal work of breathing GI: soft, nontender, nondistended, + BS MS: no deformity or atrophy Skin: warm and dry, no rash Neuro:  Strength and sensation are intact Psych: euthymic mood, full affect   EKG:  EKG is ordered today. The ekg ordered today demonstrates afib with HR 68   Recent Labs: 02/14/2015: ALT 15 04/27/2015: B Natriuretic Peptide 404.7*; TSH 2.802 05/01/2015: BUN 21*; Creatinine, Ser 1.20*; Magnesium 2.1; Potassium 4.2; Sodium 142  05/02/2015: Hemoglobin 13.9; Platelets 244    Lipid Panel    Component Value Date/Time   CHOL 167 04/29/2015 0510   TRIG 75 04/29/2015 0510   TRIG 52 01/13/2006 1012   HDL 57 04/29/2015 0510   CHOLHDL 2.9 04/29/2015 0510   CHOLHDL 3.0 CALC 01/13/2006 1012   VLDL 15 04/29/2015 0510   LDLCALC 95 04/29/2015 0510   LDLDIRECT 174.3 06/04/2009 0000   LDLDIRECT 158.9 01/13/2006 1012      Wt Readings from Last 3 Encounters:  05/08/15 144 lb 12.8 oz (65.681 kg)  05/02/15 143 lb 1.6 oz (64.91 kg)  02/14/15 145 lb 8 oz (65.998 kg)      Other studies Reviewed: Additional studies/ records that were reviewed today include: lexscan myoview, 2D ECHO. Review of the above records demonstrates:   04/29/15 [employer].  IMPRESSION:  1. No reversible ischemia or infarction.  2. Normal left ventricular wall motion.  3. Left ventricular ejection fraction 60%  4. Low-risk stress test findings*.  2D ECHO 04/28/15 LV EF: 55% -  60% Study Conclusions - Left ventricle: The cavity size was normal. Systolic function was normal. The estimated ejection fraction was in the range of 55% to 60%. Wall motion was normal; there were no regional wall motion abnormalities. The study is not technically sufficient to allow  evaluation of LV diastolic function. Doppler parameters are consistent with high ventricular filling pressure. - Aortic valve: Transvalvular velocity was within the normal range. There was no stenosis. There was mild to moderate regurgitation. Regurgitation pressure half-time: 369 ms. - Mitral valve: Transvalvular velocity was within the normal range. There was no evidence for stenosis. There was trivial regurgitation. - Left atrium: The atrium was severely dilated. - Right ventricle: The cavity size was normal. Wall thickness was normal. Systolic function was normal. - Atrial septum: No defect or patent foramen ovale was identified by color flow Doppler. - Tricuspid valve: There was mild regurgitation. - Inferior vena cava: The vessel was normal in size. The respirophasic diameter changes were in the normal range (>= 50%), consistent with normal central venous pressure.    ASSESSMENT AND PLAN:  Patricia [name] is a 75 y.o. female with a history of lung CA s/p resection (2003), breast CA s/p lumpectomy/Tamoxifen/XRT, GERD, hypothyroidism, HTN and HLD who presents to clinic for post hospitalization follow up after a recent admission for chest pain, CHF and afib with RVR as well as acute CVA.   PAF: plan is for rate control. ECG today with afib with HR 68. Continue Cardizem CD '180mg'$  daily, digoxin 0.'125mg'$  daily, and Lopressor '25mg'$  BID for rate control -- CHADVASC at least 4 (CHF 1, HTN 1, Age 15, F sex1). She was started on Eliquis '5mg'$  BID.   Chronic diastolic CHF: likely related to afib with RVR. Now that HR is under control, she has had no issues with swelling or SOB.  -- She was not discharged on lasix. Today she appears euvolemic today. If she notices any signs of volume overload or SOB she will call us and we can start a low-dose diuretic  HTN: BP soft on Lopressor '25mg'$  BID, valsartan '40mg'$  daily and Cardizem CD '180mg'$  daily. I will stop Valsartan '40mg'$  today and she will  continue to watch her BP at home. If becomes elevated >140/90 she will call us and we will start her back on '20mg'$  Valsartan  AKI: creat did go up from 0.91--> 1.2 during her admission. Will check a BMET today.   CVA: continue Eliquis  BID   HLD: continue simvastatin '40mg'$  daily   Fatigue: She does complain of generalized fatigue. We discussed how this could be related to her low blood pressures or some the medications she is on. We will continue to watch this for now.  Current medicines are reviewed at length with the patient today.  The patient does not any questions have regarding medicines.  The following changes have been made:  Stop valsartan.  Labs/ tests ordered today include:   Orders Placed This Encounter  Procedures  . Basic Metabolic Panel (BMET)  . EKG 12-Lead     Disposition:   FU with Dr. Radford Pax in 6 weeks.   Renea Ee  05/08/2015 9:28 AM    [facility] [address], [place], [place]  [address] Phone: [phone]; Fax: [phone]

## 2015-05-06 NOTE — Telephone Encounter (Signed)
Transition Care Management Follow-up Telephone Call  PCP: Annye Asa, MD  Admit date: 04/27/2015 Discharge date: 05/02/2015  Time spent:55 minutes  Recommendations for Outpatient Follow-up:  Repeat 2 view chest x-ray in 7-10 days to ensure clearing of b/l basilar infiltrates   Discharge Condition: stable   Discharge Diagnoses:  Principal Problem:  Chest pain Active Problems:  HYPERTENSION, BENIGN ESSENTIAL  GERD  CHEST PAIN  Hyperthyroidism  Pulmonary edema  Asthma  Paroxysmal atrial fibrillation (HCC)  CVA (cerebral infarction)  Acute CVA (cerebrovascular accident) ([place])  Persistent atrial fibrillation ([place])   How have you been since you were released from the hospital? Patient stated, "I've been fine, just trying to get some of my energy back".   Do you understand why you were in the hospital? yes   Do you understand the discharge instructions? yes   Where were you discharged to? Home with husband.    Items Reviewed:  Medications reviewed: yes  Allergies reviewed: yes  Dietary changes reviewed: yes, heart healthy, low sodium diet  Referrals reviewed: Per the patient, no referrals made other than a follow-up for chest x-ray.   Functional Questionnaire:   Activities of Daily Living (ADLs):   She states they are independent in the following: ambulation, bathing and hygiene, feeding, continence, grooming, toileting and dressing States they require assistance with the following: None   Any transportation issues/concerns?: no   Any patient concerns? no   Confirmed importance and date/time of follow-up visits scheduled yes, 05/09/15 at 11:00 AM.  Provider Appointment booked with Dr. Birdie Riddle.  Confirmed with patient if condition begins to worsen call PCP or go to the ER.  Patient was given the office number and encouraged to call back with question or concerns.  : yes

## 2015-05-06 NOTE — Addendum Note (Signed)
Addended by: Kathlen Brunswick on: 05/06/2015 03:43 PM   Modules accepted: Medications

## 2015-05-08 ENCOUNTER — Encounter: Payer: Self-pay | Admitting: Physician Assistant

## 2015-05-08 ENCOUNTER — Ambulatory Visit (INDEPENDENT_AMBULATORY_CARE_PROVIDER_SITE_OTHER): Payer: Medicare Other | Admitting: Physician Assistant

## 2015-05-08 ENCOUNTER — Other Ambulatory Visit: Payer: Self-pay | Admitting: *Deleted

## 2015-05-08 VITALS — BP 90/60 | HR 68 | Ht 59.0 in | Wt 144.8 lb

## 2015-05-08 DIAGNOSIS — I480 Paroxysmal atrial fibrillation: Secondary | ICD-10-CM

## 2015-05-08 DIAGNOSIS — I10 Essential (primary) hypertension: Secondary | ICD-10-CM

## 2015-05-08 DIAGNOSIS — N179 Acute kidney failure, unspecified: Secondary | ICD-10-CM

## 2015-05-08 LAB — BASIC METABOLIC PANEL
BUN: 25 mg/dL (ref 7–25)
CHLORIDE: 104 mmol/L (ref 98–110)
CO2: 27 mmol/L (ref 20–31)
Calcium: 8.8 mg/dL (ref 8.6–10.4)
Creat: 1.32 mg/dL — ABNORMAL HIGH (ref 0.60–0.93)
GLUCOSE: 104 mg/dL — AB (ref 65–99)
POTASSIUM: 4.7 mmol/L (ref 3.5–5.3)
SODIUM: 140 mmol/L (ref 135–146)

## 2015-05-08 MED ORDER — DIGOXIN 125 MCG PO TABS: 0.1250 mg | ORAL_TABLET | Freq: Every day | ORAL | Status: DC

## 2015-05-08 MED ORDER — APIXABAN 5 MG PO TABS: 5.0000 mg | ORAL_TABLET | Freq: Two times a day (BID) | ORAL | Status: DC

## 2015-05-08 MED ORDER — SIMVASTATIN 40 MG PO TABS: 40.0000 mg | ORAL_TABLET | Freq: Every day | ORAL | Status: DC

## 2015-05-08 MED ORDER — METOPROLOL TARTRATE 25 MG PO TABS: 25.0000 mg | ORAL_TABLET | Freq: Two times a day (BID) | ORAL | Status: DC

## 2015-05-08 MED ORDER — DILTIAZEM HCL ER COATED BEADS 180 MG PO CP24: 180.0000 mg | ORAL_CAPSULE | Freq: Every day | ORAL | Status: DC

## 2015-05-08 NOTE — Patient Instructions (Signed)
Medication Instructions:  Your physician has recommended you make the following change in your medication:  1.  DECREASE the Simvastatin back to 40 mg taking 1 tablet daily 2.  STOP the Valsartan   We have sent in 90 refills to your [facility]  Labwork: TODAY:  BMET  Testing/Procedures: None ordered  Follow-Up: Your physician recommends that you schedule a follow-up appointment in: [place]   Any Other Special Instructions Will Be Listed Below (If Applicable).  Please monitor your blood pressure.  If it goes over 140/90, please call our office at the # listed above.  If you need a refill on your cardiac medications before your next appointment, please call your pharmacy.

## 2015-05-09 ENCOUNTER — Ambulatory Visit (HOSPITAL_BASED_OUTPATIENT_CLINIC_OR_DEPARTMENT_OTHER)
Admission: RE | Admit: 2015-05-09 | Discharge: 2015-05-09 | Disposition: A | Discharge status: Home / Self Care | Payer: Medicare Other | Source: Ambulatory Visit | Attending: Family Medicine | Admitting: Family Medicine

## 2015-05-09 ENCOUNTER — Encounter: Payer: Self-pay | Admitting: Family Medicine

## 2015-05-09 ENCOUNTER — Ambulatory Visit (INDEPENDENT_AMBULATORY_CARE_PROVIDER_SITE_OTHER): Payer: Medicare Other | Admitting: Family Medicine

## 2015-05-09 VITALS — BP 132/70 | HR 75 | Temp 98.0°F | Resp 16 | Ht 59.0 in | Wt 145.2 lb

## 2015-05-09 DIAGNOSIS — J45909 Unspecified asthma, uncomplicated: Secondary | ICD-10-CM | POA: Diagnosis not present

## 2015-05-09 DIAGNOSIS — I4819 Other persistent atrial fibrillation: Secondary | ICD-10-CM

## 2015-05-09 DIAGNOSIS — J810 Acute pulmonary edema: Secondary | ICD-10-CM | POA: Diagnosis not present

## 2015-05-09 DIAGNOSIS — I481 Persistent atrial fibrillation: Secondary | ICD-10-CM

## 2015-05-09 DIAGNOSIS — I639 Cerebral infarction, unspecified: Secondary | ICD-10-CM | POA: Diagnosis not present

## 2015-05-09 DIAGNOSIS — I63412 Cerebral infarction due to embolism of left middle cerebral artery: Secondary | ICD-10-CM

## 2015-05-09 DIAGNOSIS — Z9889 Other specified postprocedural states: Secondary | ICD-10-CM | POA: Insufficient documentation

## 2015-05-09 DIAGNOSIS — R0989 Other specified symptoms and signs involving the circulatory and respiratory systems: Secondary | ICD-10-CM | POA: Diagnosis not present

## 2015-05-09 NOTE — Patient Instructions (Signed)
Follow up as scheduled Go downstairs and get your chest xray Keep up the good work- you look great! Call with any questions or concerns If you want to join [name] at the new [place] office, any scheduled appointments will automatically transfer and we will see you at [address] [name] [address] [place], [place], [place] [address] ([place]) [place] in there!!!  You're doing great!!!

## 2015-05-09 NOTE — Progress Notes (Signed)
Pre visit review using our clinic review tool, if applicable. No additional management support is needed unless otherwise documented below in the visit note. 

## 2015-05-09 NOTE — Progress Notes (Signed)
   Subjective:    Patient ID: Patricia [name], female    DOB: June 14, 1940, 75 y.o.   MRN: [id number]  HPI Hospital F/U- pt was admitted 2/25-3/2 w/ chest pain and found to have new onset Afib and CVA in [facility] distribution.  Was started on Eliquis '5mg'$  BID and Cardizem.  Now following w/ Cards and started on dig.  Has neuro f/u scheduled.  Pt denies any deficits from CVA.  No CP, SOB w/ exception of deconditioning, HAs, visual changes, edema, palpitations, bleeding/bruising.  Denies cough.   Review of Systems For ROS see HPI     Objective:   Physical Exam  Constitutional: She is oriented to person, place, and time. She appears well-developed and well-nourished. No distress.  HENT:  Head: Normocephalic and atraumatic.  Eyes: Conjunctivae and EOM are normal. Pupils are equal, round, and reactive to light.  Neck: Normal range of motion. Neck supple. No thyromegaly present.  Cardiovascular: Normal rate, normal heart sounds and intact distal pulses.   No murmur heard. Irregularly irregular S1/S2  Pulmonary/Chest: Effort normal and breath sounds normal. No respiratory distress.  Abdominal: Soft. She exhibits no distension. There is no tenderness.  Musculoskeletal: She exhibits no edema.  Lymphadenopathy:    She has no cervical adenopathy.  Neurological: She is alert and oriented to person, place, and time.  Skin: Skin is warm and dry.  Psychiatric: She has a normal mood and affect. Her behavior is normal.  Vitals reviewed.         Assessment & Plan:

## 2015-05-12 NOTE — Assessment & Plan Note (Signed)
New.  Noted on CXR during recent hospitalizations.  D/C summary requested repeat CXR to assess.  CXR ordered.  Will make adjustments based on results.  Pt expressed understanding and is in agreement w/ plan.

## 2015-05-12 NOTE — Assessment & Plan Note (Signed)
Pt remains in Afib today.  Following w/ Cards.  Now on Dig, Dilt, Metoprolol.  On Eliquis to reduce risk for future emboli.  She is currently asymptomatic.  Will follow along and assist as able.

## 2015-05-12 NOTE — Assessment & Plan Note (Signed)
New.  Due to embolism from afib.  Pt currently on Eliquis and following w/ Cardiology.  Has neuro f/u but thankfully no current deficits.  On statin.  Will continue to follow.

## 2015-05-14 ENCOUNTER — Telehealth: Payer: Self-pay | Admitting: *Deleted

## 2015-05-14 DIAGNOSIS — I10 Essential (primary) hypertension: Secondary | ICD-10-CM

## 2015-05-14 NOTE — Telephone Encounter (Signed)
-----   Message from Eileen Stanford, PA-C sent at 05/08/2015  8:23 PM EST ----- Renal function a little worse than last week. Lets recheck in 1 week.

## 2015-05-14 NOTE — Telephone Encounter (Signed)
Pt has been made aware of her lab results and kidney function being little worse than the previous week.  She will come in 05/16/15 and do a repeat BMET. Order in [place]. Pt verbalized understanding.

## 2015-05-16 ENCOUNTER — Other Ambulatory Visit (INDEPENDENT_AMBULATORY_CARE_PROVIDER_SITE_OTHER): Payer: Medicare Other | Admitting: *Deleted

## 2015-05-16 DIAGNOSIS — I10 Essential (primary) hypertension: Secondary | ICD-10-CM | POA: Diagnosis not present

## 2015-05-16 LAB — BASIC METABOLIC PANEL
BUN: 24 mg/dL (ref 7–25)
CHLORIDE: 105 mmol/L (ref 98–110)
CO2: 27 mmol/L (ref 20–31)
CREATININE: 1.1 mg/dL — AB (ref 0.60–0.93)
Calcium: 8.7 mg/dL (ref 8.6–10.4)
Glucose, Bld: 106 mg/dL — ABNORMAL HIGH (ref 65–99)
POTASSIUM: 4.4 mmol/L (ref 3.5–5.3)
SODIUM: 140 mmol/L (ref 135–146)

## 2015-05-16 NOTE — Addendum Note (Signed)
Addended by: Eulis Foster on: 05/16/2015 10:20 AM   Modules accepted: Orders

## 2015-05-24 DIAGNOSIS — H52202 Unspecified astigmatism, left eye: Secondary | ICD-10-CM | POA: Diagnosis not present

## 2015-05-24 DIAGNOSIS — H2513 Age-related nuclear cataract, bilateral: Secondary | ICD-10-CM | POA: Diagnosis not present

## 2015-05-24 DIAGNOSIS — H01004 Unspecified blepharitis left upper eyelid: Secondary | ICD-10-CM | POA: Diagnosis not present

## 2015-05-24 DIAGNOSIS — H01001 Unspecified blepharitis right upper eyelid: Secondary | ICD-10-CM | POA: Diagnosis not present

## 2015-06-03 ENCOUNTER — Other Ambulatory Visit: Payer: Self-pay | Admitting: Family Medicine

## 2015-06-03 NOTE — Telephone Encounter (Signed)
Medication filled to pharmacy as requested.   

## 2015-06-04 ENCOUNTER — Encounter: Payer: Self-pay | Admitting: Nurse Practitioner

## 2015-06-04 ENCOUNTER — Ambulatory Visit (INDEPENDENT_AMBULATORY_CARE_PROVIDER_SITE_OTHER): Payer: Medicare Other | Admitting: Nurse Practitioner

## 2015-06-04 VITALS — BP 113/80 | HR 67 | Ht 59.0 in | Wt 149.4 lb

## 2015-06-04 DIAGNOSIS — E785 Hyperlipidemia, unspecified: Secondary | ICD-10-CM

## 2015-06-04 DIAGNOSIS — I480 Paroxysmal atrial fibrillation: Secondary | ICD-10-CM | POA: Diagnosis not present

## 2015-06-04 DIAGNOSIS — I639 Cerebral infarction, unspecified: Secondary | ICD-10-CM | POA: Diagnosis not present

## 2015-06-04 DIAGNOSIS — I10 Essential (primary) hypertension: Secondary | ICD-10-CM

## 2015-06-04 NOTE — Progress Notes (Signed)
I reviewed above note and agree with the assessment and plan.  Rosalin Hawking, MD PhD Stroke Neurology 06/04/2015 10:52 PM

## 2015-06-04 NOTE — Progress Notes (Signed)
[facility]  PATIENT: Patricia [name] DOB: [birth year]   REASON FOR VISIT: Hospital follow-up for stroke  HISTORY FROM: Patient and husband    HISTORY OF PRESENT ILLNESS:Patricia [name] is an 75 y.o. female returns for hospital follow-up after a stroke in February. She was originally admitted for evaluation of chest pain . On admission she stated that her speech had been slurred for 2 days. A new diagnosis of paroxysmal atrial fibrillation was made. Given the slurred speech and atrial fibrillation, possible stroke was suspected. 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 Shower emboli were suspected based upon the overall MRI appearance. Patient was not considered for TPA secondary to delay in arrival. CT angiogram of the head and neck was unremarkable. 2-D echo no source of embolism. LDL 95. Hemoglobin A1c 5.7. She was switched to Eliquis from daily aspirin prior to admission. She was changed to Crestor for her LDL however her insurance would not cover this. She was placed on Zocor 80 mg, but Dr. Radford Pax reduced to 40 mg at  follow-up visit. She has not had further stroke or TIA symptoms. She does complain of some fatigue she returns for reevaluation   REVIEW OF SYSTEMS: Full 14 system review of systems performed and notable only for those listed, all others are neg:  Constitutional: Fatigue Cardiovascular: neg Ear/Nose/Throat: neg  Skin: neg Eyes: neg Respiratory: neg Gastroitestinal: neg  Hematology/Lymphatic: neg  Endocrine: neg Musculoskeletal:neg Allergy/Immunology: neg Neurological: neg Psychiatric: neg Sleep : neg   ALLERGIES: Allergies  Allergen Reactions  . Combigan [Brimonidine Tartrate-Timolol]     Eyes itch, reddened  . Sulfa Antibiotics Rash    HOME MEDICATIONS: Outpatient Prescriptions Prior to Visit  Medication Sig Dispense Refill  . albuterol (PROVENTIL HFA;VENTOLIN HFA) 108 (90 Base)  MCG/ACT inhaler Inhale 2 puffs into the lungs every 6 (six) hours as needed for wheezing or shortness of breath.    Marland Kitchen apixaban (ELIQUIS) 5 MG TABS tablet Take 1 tablet (5 mg total) by mouth 2 (two) times daily. 180 tablet 3  . ARMOUR THYROID 15 MG tablet TAKE 1 TABLET DAILY 90 tablet 1  . bimatoprost (LUMIGAN) 0.03 % ophthalmic drops Place 1 drop into both eyes at bedtime.     . digoxin (LANOXIN) 0.125 MG tablet Take 1 tablet (0.125 mg total) by mouth daily. 90 tablet 3  . diltiazem (CARDIZEM CD) 180 MG 24 hr capsule Take 1 capsule (180 mg total) by mouth daily. 90 capsule 3  . dorzolamide (TRUSOPT) 2 % ophthalmic solution Place 1 drop into both eyes 2 (two) times daily.    . fluticasone-salmeterol (ADVAIR HFA) 230-21 MCG/ACT inhaler INHALE 2 PUFFS INTO THE LUNGS TWICE A DAY 3 Inhaler 2  . gabapentin (NEURONTIN) 400 MG capsule Take 1 capsule (400 mg total) by mouth 2 (two) times daily. 180 capsule 1  . loratadine (CLARITIN) 10 MG tablet Take 10 mg by mouth daily as needed for allergies.    . metoprolol tartrate (LOPRESSOR) 25 MG tablet Take 1 tablet (25 mg total) by mouth 2 (two) times daily. 180 tablet 3  . Multiple Vitamins-Iron (MULTIVITAMIN/IRON) TABS Take 1 tablet by mouth daily.     Marland Kitchen omeprazole (PRILOSEC) 20 MG capsule TAKE 1 CAPSULE DAILY 30 MINUTES BEFORE A MEAL 90 capsule 1  . simvastatin (ZOCOR) 40 MG tablet Take 1 tablet (40 mg total) by mouth at bedtime. 90 tablet 3  . tamoxifen (NOLVADEX) 20 MG tablet Take 1 tablet (20  mg total) by mouth daily. 90 tablet 4  . traMADol (ULTRAM) 50 MG tablet Take 1 tablet (50 mg total) by mouth every 6 (six) hours as needed. for pain 45 tablet 0   No facility-administered medications prior to visit.    PAST MEDICAL HISTORY: Past Medical History  Diagnosis Date  . Cough variant asthma   . Osteopenia   . Stricture and stenosis of cervix   . Postmenopausal   . GERD (gastroesophageal reflux disease)   . Disease of pharynx or nasopharynx   .  Elevated BP   . Neoplasm   . Personal history of malignant neoplasm of bronchus and lung   . Allergic rhinitis, cause unspecified   . Diverticulosis   . Internal hemorrhoids   . Hiatal hernia   . Hypertension   . Laryngospasm   . 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 Hyperlipidemia   . Glaucoma   . Laryngospasm   . Multinodular goiter   . Chronic cough   . 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 Radiation 10/22/14-11/23/14    Left Breast  . CHF (congestive heart failure) ([place])     PAST SURGICAL HISTORY: Past Surgical History  Procedure Laterality Date  . Lung cancer surgery  2003    resection carcinoid lingula-lt upper lobe  . Colonoscopy    . Radioactive seed guided mastectomy with axillary sentinel lymph node biopsy Left 06/26/2014    Procedure: RADIOACTIVE SEED GUIDED PARTIAL MASTECTOMY WITH AXILLARY SENTINEL LYMPH NODE BIOPSY;  Surgeon: Stark Klein, MD;  Location: [place];  Service: General;  Laterality: Left;  . Breast surgery    . Re-excision of breast lumpectomy Left 08/07/2014    Procedure: RE-EXCISION OF LEFT BREAST LUMPECTOMY;  Surgeon: Stark Klein, MD;  Location: [place];  Service: General;  Laterality: Left;    FAMILY HISTORY: Family History  Problem Relation Age of Onset  . Stroke Mother   . Hypertension Mother   . Hyperlipidemia Mother   . Stroke Father   . Transient ischemic attack Father   . Hyperlipidemia Father   . Hypertension Father   . Heart attack Neg Hx   . Atrial fibrillation Son     SOCIAL HISTORY: Social History   Social History  . Marital Status: Married    Spouse Name: N/A  . Number of Children: N/A  . Years of Education: N/A   Occupational History  . Not on file.   Social History Main Topics  . Smoking status: Former Smoker    Quit date: 06/15/1963  . Smokeless tobacco: Not on file  . Alcohol Use: Yes     Comment: occasional wine  . Drug Use: No  . Sexual Activity: Not on  file   Other Topics Concern  . Not on file   Social History Narrative     PHYSICAL EXAM  Filed Vitals:   06/04/15 0951  BP: 113/80  Pulse: 67  Height: '4\' 11"'$  (1.499 m)  Weight: 149 lb 6.4 oz (67.767 kg)   Body mass index is 30.16 kg/(m^2).  Generalized: Well developed, in no acute distress  Head: normocephalic and atraumatic,. Oropharynx benign  Neck: Supple, no carotid bruits  Cardiac: Irregularly irregular rate  Musculoskeletal: No deformity   Neurological examination   Mentation: Alert oriented to time, place, history taking. Attention span and concentration appropriate. Recent and remote memory intact.  Follows all commands speech and language fluent.  Cranial nerve II-XII: Pupils were equal round reactive to light extraocular movements were full, visual field were full on confrontational test. Facial sensation and strength were normal. hearing was intact to finger rubbing bilaterally. Uvula tongue midline. head turning and shoulder shrug were normal and symmetric.Tongue protrusion into cheek strength was normal. Motor: normal bulk and tone, full strength in the BUE, BLE, fine finger movements normal, no pronator drift. No focal weakness Sensory: normal and symmetric to light touch,  Coordination: finger-nose-finger, heel-to-shin bilaterally, no dysmetria Reflexes: 1+ upper lower and symmetric plantar responses were flexor bilaterally. Gait and Station: Rising up from seated position without assistance, normal stance,  moderate stride, good arm swing, smooth turning, able to perform tiptoe, and heel walking without difficulty. Tandem gait is steady  DIAGNOSTIC DATA (LABS, IMAGING, TESTING) - I reviewed patient records, labs, notes, testing and imaging myself where available.  Lab Results  Component Value Date   WBC 7.2 05/02/2015   HGB 13.9 05/02/2015   HCT 42.5 05/02/2015   MCV 95.7 05/02/2015   PLT 244 05/02/2015      Component Value Date/Time   NA 140  05/16/2015 1020   NA 142 11/22/2014 0902   K 4.4 05/16/2015 1020   K 4.0 11/22/2014 0902   CL 105 05/16/2015 1020   CO2 27 05/16/2015 1020   CO2 25 11/22/2014 0902   GLUCOSE 106* 05/16/2015 1020   GLUCOSE 97 11/22/2014 0902   GLUCOSE 88 01/13/2006 1012   BUN 24 05/16/2015 1020   BUN 19.9 11/22/2014 0902   CREATININE 1.10* 05/16/2015 1020   CREATININE 1.20* 05/01/2015 0420   CREATININE 0.9 11/22/2014 0902   CALCIUM 8.7 05/16/2015 1020   CALCIUM 8.6 11/22/2014 0902   PROT 6.6 02/14/2015 1032   PROT 6.0* 11/22/2014 0902   ALBUMIN 4.0 02/14/2015 1032   ALBUMIN 3.3* 11/22/2014 0902   AST 17 02/14/2015 1032   AST 20 11/22/2014 0902   ALT 15 02/14/2015 1032   ALT 28 11/22/2014 0902   ALKPHOS 56 02/14/2015 1032   ALKPHOS 61 11/22/2014 0902   BILITOT 0.5 02/14/2015 1032   BILITOT 0.40 11/22/2014 0902   GFRNONAA 43* 05/01/2015 0420   GFRAA 50* 05/01/2015 0420   Lab Results  Component Value Date   CHOL 167 04/29/2015   HDL 57 04/29/2015   LDLCALC 95 04/29/2015   LDLDIRECT 174.3 06/04/2009   TRIG 75 04/29/2015   CHOLHDL 2.9 04/29/2015   Lab Results  Component Value Date   HGBA1C 5.7* 04/29/2015    Lab Results  Component Value Date   TSH 2.802 04/27/2015      ASSESSMENT AND PLAN  75 y.o. year old female  with hospital admission for right MCA infarct secondary to newly diagnosed atrial fibrillation.She has risk factors of hypertension, hyperlipidemia.  PLAN: Monitoring stroke risk factors Continue Eliquis for atrial fibrillation and secondary stroke prevention Blood pressure in excellent control today 341/96 keep systolic less than 222 continue present management Continue statin Zocor 40 mg daily for secondary stroke prevention reduced by Dr. Radford Pax (Crestor not covered) Heart healthy low sodium diet Increase activity slowly Follow-up in 3 months Dennie Bible, [facility], [facility], [facility] [address], [place], [place]  [address] [phone]

## 2015-06-04 NOTE — Patient Instructions (Signed)
Continue Eliquis for atrial fibrillation and secondary stroke prevention Blood pressure in excellent control today 504/13 keep systolic less than 643 continue present management Continue statin Zocor 80 mg daily for secondary stroke prevention Heart healthy low sodium diet Increase activity slowly Follow-up in 3 months

## 2015-06-05 ENCOUNTER — Other Ambulatory Visit: Payer: Self-pay | Admitting: General Practice

## 2015-06-05 MED ORDER — TRAMADOL HCL 50 MG PO TABS: 50.0000 mg | ORAL_TABLET | Freq: Four times a day (QID) | ORAL | Status: DC | PRN

## 2015-06-05 NOTE — Telephone Encounter (Signed)
Last OV 05/09/15 Tramadol last filled 04-11-15 #45 with 0

## 2015-06-05 NOTE — Telephone Encounter (Signed)
Medication filled to pharmacy as requested.   

## 2015-06-07 ENCOUNTER — Other Ambulatory Visit: Payer: Self-pay | Admitting: General Practice

## 2015-06-07 MED ORDER — TRAMADOL HCL 50 MG PO TABS: 50.0000 mg | ORAL_TABLET | Freq: Four times a day (QID) | ORAL | Status: DC | PRN

## 2015-06-10 ENCOUNTER — Other Ambulatory Visit: Payer: Self-pay | Admitting: Family Medicine

## 2015-06-10 NOTE — Telephone Encounter (Signed)
Medication filled to pharmacy as requested.   

## 2015-06-11 ENCOUNTER — Telehealth: Payer: Self-pay | Admitting: Oncology

## 2015-06-11 NOTE — Telephone Encounter (Signed)
returned call and s.w. pt and r/s lab to later time on the same day....pt ok and aware

## 2015-06-12 ENCOUNTER — Telehealth: Payer: Self-pay | Admitting: Family Medicine

## 2015-06-12 NOTE — Telephone Encounter (Signed)
Can be reached: [phone]   Reason for call: pt said that she just received new shipment of thyroid meds last week of '30mg'$  and yesterday she received '15mg'$ . She isn't sure which to take. If before 1pm leave msg or after 1pm to talk with pt.

## 2015-06-12 NOTE — Telephone Encounter (Signed)
Called pt and LMOVM to inform that the error occurred on [facility] end. They did not cancel out the '15mg'$  prescription and it was set for automatic refill. Pt advised to continue '30mg'$  thyroid medication and to contact her pharmacy to have this '15mg'$  rx discontinued.

## 2015-06-13 ENCOUNTER — Other Ambulatory Visit: Payer: Self-pay

## 2015-06-13 ENCOUNTER — Other Ambulatory Visit (HOSPITAL_BASED_OUTPATIENT_CLINIC_OR_DEPARTMENT_OTHER): Payer: Medicare Other

## 2015-06-13 DIAGNOSIS — M8580 Other specified disorders of bone density and structure, unspecified site: Secondary | ICD-10-CM

## 2015-06-13 DIAGNOSIS — Z853 Personal history of malignant neoplasm of breast: Secondary | ICD-10-CM

## 2015-06-13 DIAGNOSIS — H401111 Primary open-angle glaucoma, right eye, mild stage: Secondary | ICD-10-CM | POA: Diagnosis not present

## 2015-06-13 DIAGNOSIS — C50412 Malignant neoplasm of upper-outer quadrant of left female breast: Secondary | ICD-10-CM

## 2015-06-13 LAB — CBC WITH DIFFERENTIAL/PLATELET
BASO%: 0.2 % (ref 0.0–2.0)
BASOS ABS: 0 10*3/uL (ref 0.0–0.1)
EOS%: 0 % (ref 0.0–7.0)
Eosinophils Absolute: 0 10*3/uL (ref 0.0–0.5)
HEMATOCRIT: 41.1 % (ref 34.8–46.6)
HGB: 14.1 g/dL (ref 11.6–15.9)
LYMPH#: 1.7 10*3/uL (ref 0.9–3.3)
LYMPH%: 26.3 % (ref 14.0–49.7)
MCH: 32.5 pg (ref 25.1–34.0)
MCHC: 34.3 g/dL (ref 31.5–36.0)
MCV: 94.7 fL (ref 79.5–101.0)
MONO#: 0.5 10*3/uL (ref 0.1–0.9)
MONO%: 7.8 % (ref 0.0–14.0)
NEUT#: 4.3 10*3/uL (ref 1.5–6.5)
NEUT%: 65.7 % (ref 38.4–76.8)
PLATELETS: 208 10*3/uL (ref 145–400)
RBC: 4.34 10*6/uL (ref 3.70–5.45)
RDW: 12.9 % (ref 11.2–14.5)
WBC: 6.5 10*3/uL (ref 3.9–10.3)

## 2015-06-13 LAB — COMPREHENSIVE METABOLIC PANEL
ALT: 15 U/L (ref 0–55)
ANION GAP: 8 meq/L (ref 3–11)
AST: 17 U/L (ref 5–34)
Albumin: 3.5 g/dL (ref 3.5–5.0)
Alkaline Phosphatase: 57 U/L (ref 40–150)
BUN: 20.5 mg/dL (ref 7.0–26.0)
CALCIUM: 8.7 mg/dL (ref 8.4–10.4)
CHLORIDE: 108 meq/L (ref 98–109)
CO2: 25 mEq/L (ref 22–29)
Creatinine: 1.1 mg/dL (ref 0.6–1.1)
EGFR: 49 mL/min/{1.73_m2} — AB (ref >90)
Glucose: 124 mg/dl (ref 70–140)
POTASSIUM: 4 meq/L (ref 3.5–5.1)
Sodium: 141 mEq/L (ref 136–145)
Total Bilirubin: 0.36 mg/dL (ref 0.20–1.20)
Total Protein: 6.3 g/dL — ABNORMAL LOW (ref 6.4–8.3)

## 2015-06-14 DIAGNOSIS — H401111 Primary open-angle glaucoma, right eye, mild stage: Secondary | ICD-10-CM | POA: Diagnosis not present

## 2015-06-17 ENCOUNTER — Encounter: Payer: Self-pay | Admitting: Cardiology

## 2015-06-17 ENCOUNTER — Other Ambulatory Visit: Payer: Self-pay | Admitting: Cardiology

## 2015-06-17 ENCOUNTER — Ambulatory Visit (INDEPENDENT_AMBULATORY_CARE_PROVIDER_SITE_OTHER): Payer: Medicare Other | Admitting: Cardiology

## 2015-06-17 VITALS — BP 122/62 | HR 74 | Ht 59.0 in | Wt 147.4 lb

## 2015-06-17 DIAGNOSIS — I10 Essential (primary) hypertension: Secondary | ICD-10-CM | POA: Diagnosis not present

## 2015-06-17 DIAGNOSIS — Z01812 Encounter for preprocedural laboratory examination: Secondary | ICD-10-CM

## 2015-06-17 DIAGNOSIS — I4891 Unspecified atrial fibrillation: Secondary | ICD-10-CM | POA: Diagnosis not present

## 2015-06-17 DIAGNOSIS — I481 Persistent atrial fibrillation: Secondary | ICD-10-CM

## 2015-06-17 DIAGNOSIS — I351 Nonrheumatic aortic (valve) insufficiency: Secondary | ICD-10-CM

## 2015-06-17 DIAGNOSIS — I639 Cerebral infarction, unspecified: Secondary | ICD-10-CM

## 2015-06-17 DIAGNOSIS — I480 Paroxysmal atrial fibrillation: Secondary | ICD-10-CM

## 2015-06-17 DIAGNOSIS — I5032 Chronic diastolic (congestive) heart failure: Secondary | ICD-10-CM | POA: Diagnosis not present

## 2015-06-17 DIAGNOSIS — I4819 Other persistent atrial fibrillation: Secondary | ICD-10-CM

## 2015-06-17 DIAGNOSIS — I359 Nonrheumatic aortic valve disorder, unspecified: Secondary | ICD-10-CM | POA: Diagnosis not present

## 2015-06-17 HISTORY — DX: Chronic diastolic (congestive) heart failure: I50.32

## 2015-06-17 HISTORY — DX: Nonrheumatic aortic (valve) insufficiency: I35.1

## 2015-06-17 LAB — CBC WITH DIFFERENTIAL/PLATELET
BASOS PCT: 0 %
Basophils Absolute: 0 cells/uL (ref 0–200)
EOS ABS: 0 {cells}/uL — AB (ref 15–500)
EOS PCT: 0 %
HCT: 39.9 % (ref 35.0–45.0)
Hemoglobin: 13.5 g/dL (ref 11.7–15.5)
Lymphocytes Relative: 26 %
Lymphs Abs: 1872 cells/uL (ref 850–3900)
MCH: 32.1 pg (ref 27.0–33.0)
MCHC: 33.8 g/dL (ref 32.0–36.0)
MCV: 95 fL (ref 80.0–100.0)
MONOS PCT: 8 %
MPV: 9 fL (ref 7.5–12.5)
Monocytes Absolute: 576 cells/uL (ref 200–950)
NEUTROS ABS: 4752 {cells}/uL (ref 1500–7800)
Neutrophils Relative %: 66 %
PLATELETS: 221 10*3/uL (ref 140–400)
RBC: 4.2 MIL/uL (ref 3.80–5.10)
RDW: 13.4 % (ref 11.0–15.0)
WBC: 7.2 10*3/uL (ref 3.8–10.8)

## 2015-06-17 LAB — BASIC METABOLIC PANEL
BUN: 22 mg/dL (ref 7–25)
CHLORIDE: 107 mmol/L (ref 98–110)
CO2: 23 mmol/L (ref 20–31)
CREATININE: 1.14 mg/dL — AB (ref 0.60–0.93)
Calcium: 8.5 mg/dL — ABNORMAL LOW (ref 8.6–10.4)
Glucose, Bld: 125 mg/dL — ABNORMAL HIGH (ref 65–99)
Potassium: 4 mmol/L (ref 3.5–5.3)
Sodium: 140 mmol/L (ref 135–146)

## 2015-06-17 LAB — APTT: aPTT: 29 seconds (ref 24–37)

## 2015-06-17 LAB — PROTIME-INR
INR: 1.41 (ref <1.50)
PROTHROMBIN TIME: 17.4 s — AB (ref 11.6–15.2)

## 2015-06-17 MED ORDER — FUROSEMIDE 20 MG PO TABS: 20.0000 mg | ORAL_TABLET | Freq: Every day | ORAL | Status: DC | PRN

## 2015-06-17 MED ORDER — FUROSEMIDE 20 MG PO TABS: 20.0000 mg | ORAL_TABLET | Freq: Every day | ORAL | Status: DC

## 2015-06-17 NOTE — Progress Notes (Signed)
Cardiology Office Note    Date:  06/17/2015   ID:  Patricia [name], DOB [birth year], MRN [id number]  PCP:  Annye Asa, MD  Cardiologist:  Sueanne Margarita, MD   Chief Complaint  Patient presents with  . Atrial Fibrillation  . Congestive Heart Failure    History of Present Illness:  Patricia [name] is a 75 y.o. female with a history of lung CA s/p resection (2003), breast CA s/p lumpectomy/Tamoxifen/XRT, GERD, hypothyroidism, HTN and HLD who presents for follow up after a recent admission for chest pain, CHF and afib with RVR as well as acute CVA.   She presented to [facility] with chest pain, SOB, orthopnea as well as some confusion and word finding difficulties. 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 CXR with CHF and BNP elevated. She was given a short course of IV lasix with improvement and started on IV heparin gtt. She underwent nuclear stress testing on 04/29/15 which was low risk for ischemia; EF 60%. 2D ECHO showed normal LV function, no RWMA, high ventricular filling pressures, mild-mod AR, severe LA dilation, mild TR. She was eventually transitioned to Eliquis '5mg'$  BID and started on oral lopressor 12.'5mg'$  BID, digoxin 0.'125mg'$  daily and Cardizem '180mg'$  daily. She had some mild renal insufficiency creat did go up from 0.91--> 1.2 during her admission.  Today she presents to clinic for follow up.  She denies any chest pain,  palpitations, orthopnea, PND. No dizziness or syncope. She has been taking all of her medicines as prescribed.  She has had some LE edema since coming home.  She has been using the salt shaker and today her legs are swollen more but she ate Ham yesterday for Easter.  She feels more fatigued and has noticed some SOB since having afib that she did not have in the past.       Past Medical History  Diagnosis Date  . Cough variant asthma   . Osteopenia   . Stricture and stenosis  of cervix   . Postmenopausal   . GERD (gastroesophageal reflux disease)   . Disease of pharynx or nasopharynx   . Benign essential HTN   . Allergic rhinitis, cause unspecified   . Diverticulosis   . Internal hemorrhoids   . Hiatal hernia   . Laryngospasm   . 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 Hyperlipidemia   . Glaucoma   . Laryngospasm   . Multinodular goiter   . Chronic cough   . 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 Radiation 10/22/14-11/23/14    Left Breast  . Chronic diastolic heart failure ([place]) 06/17/2015  . Aortic regurgitation 06/17/2015    Mild to moderate AR by echo 04/2015  . Persistent atrial fibrillation ([facility])     CHADS2VASC score is 7 - on Apixaban    Past Surgical History  Procedure Laterality Date  . Lung cancer surgery  2003    resection carcinoid lingula-lt upper lobe  . Colonoscopy    . Radioactive seed guided mastectomy with axillary sentinel lymph node biopsy Left 06/26/2014    Procedure: RADIOACTIVE SEED GUIDED PARTIAL MASTECTOMY WITH AXILLARY SENTINEL LYMPH NODE BIOPSY;  Surgeon: Stark Klein, MD;  Location: [place];  Service: General;  Laterality: Left;  . Breast surgery    . Re-excision of breast lumpectomy Left 08/07/2014    Procedure: RE-EXCISION OF  LEFT BREAST LUMPECTOMY;  Surgeon: Stark Klein, MD;  Location: [place];  Service: General;  Laterality: Left;    Current Medications: Outpatient Prescriptions Prior to Visit  Medication Sig Dispense Refill  . albuterol (PROVENTIL HFA;VENTOLIN HFA) 108 (90 Base) MCG/ACT inhaler Inhale 2 puffs into the lungs every 6 (six) hours as needed for wheezing or shortness of breath.    Marland Kitchen apixaban (ELIQUIS) 5 MG TABS tablet Take 1 tablet (5 mg total) by mouth 2 (two) times daily. 180 tablet 3  . ARMOUR THYROID 15 MG tablet TAKE 1 TABLET DAILY 90 tablet 1  . bimatoprost (LUMIGAN) 0.03 % ophthalmic drops Place 1 drop into both eyes at bedtime.     . digoxin  (LANOXIN) 0.125 MG tablet Take 1 tablet (0.125 mg total) by mouth daily. 90 tablet 3  . diltiazem (CARDIZEM CD) 180 MG 24 hr capsule Take 1 capsule (180 mg total) by mouth daily. 90 capsule 3  . dorzolamide (TRUSOPT) 2 % ophthalmic solution Place 1 drop into both eyes 2 (two) times daily.    . fluticasone-salmeterol (ADVAIR HFA) 230-21 MCG/ACT inhaler INHALE 2 PUFFS INTO THE LUNGS TWICE A DAY 3 Inhaler 2  . gabapentin (NEURONTIN) 400 MG capsule Take 1 capsule (400 mg total) by mouth 2 (two) times daily. 180 capsule 1  . loratadine (CLARITIN) 10 MG tablet Take 10 mg by mouth daily as needed for allergies.    . metoprolol tartrate (LOPRESSOR) 25 MG tablet Take 1 tablet (25 mg total) by mouth 2 (two) times daily. 180 tablet 3  . Multiple Vitamins-Iron (MULTIVITAMIN/IRON) TABS Take 1 tablet by mouth daily.     Marland Kitchen omeprazole (PRILOSEC) 20 MG capsule TAKE 1 CAPSULE DAILY 30 MINUTES BEFORE A MEAL 90 capsule 1  . simvastatin (ZOCOR) 40 MG tablet Take 1 tablet (40 mg total) by mouth at bedtime. 90 tablet 3  . tamoxifen (NOLVADEX) 20 MG tablet Take 1 tablet (20 mg total) by mouth daily. 90 tablet 4  . traMADol (ULTRAM) 50 MG tablet Take 1 tablet (50 mg total) by mouth every 6 (six) hours as needed. for pain 45 tablet 0   No facility-administered medications prior to visit.     Allergies:   Combigan and Sulfa antibiotics   Social History   Social History  . Marital Status: Married    Spouse Name: N/A  . Number of Children: N/A  . Years of Education: N/A   Social History Main Topics  . Smoking status: Former Smoker    Quit date: 06/15/1963  . Smokeless tobacco: Never Used  . Alcohol Use: 0.0 oz/week    0 Standard drinks or equivalent per week     Comment: occasional wine  . Drug Use: No  . Sexual Activity: Not Asked   Other Topics Concern  . None   Social History Narrative     Family History:  The patient's family history includes Atrial fibrillation in her son; Hyperlipidemia in her  father and mother; Hypertension in her father and mother; Stroke in her father and mother; Transient ischemic attack in her father. There is no history of Heart attack.   ROS:   Please see the history of present illness.    Review of Systems  Constitution: Positive for malaise/fatigue.  HENT: Negative.   Eyes: Negative.   Cardiovascular: Positive for dyspnea on exertion and leg swelling.  Respiratory: Negative.   Skin: Negative.   Musculoskeletal: Negative.   Gastrointestinal: Negative.   Genitourinary: Negative.   Neurological:  Negative.   Psychiatric/Behavioral: Negative.    All other systems reviewed and are negative.   PHYSICAL EXAM:   VS:  BP 122/62 mmHg  Pulse 74  Ht '4\' 11"'$  (1.499 m)  Wt 147 lb 6.4 oz (66.86 kg)  BMI 29.76 kg/m2   GEN: Well nourished, well developed, in no acute distress HEENT: normal Neck: no JVD, carotid bruits, or masses Cardiac: irregularly irregular; no murmurs, rubs, or gallops.  Intact distal pulses bilaterally. 1+ edema Respiratory:  clear to auscultation bilaterally, normal work of breathing GI: soft, nontender, nondistended, + BS MS: no deformity or atrophy Skin: warm and dry, no rash Neuro:  Alert and Oriented x 3, Strength and sensation are intact Psych: euthymic mood, full affect  Wt Readings from Last 3 Encounters:  06/17/15 147 lb 6.4 oz (66.86 kg)  06/04/15 149 lb 6.4 oz (67.767 kg)  05/09/15 145 lb 4 oz (65.885 kg)      Studies/Labs Reviewed:   EKG:  EKG is not ordered today.    Recent Labs: 04/27/2015: B Natriuretic Peptide 404.7*; TSH 2.802 05/01/2015: Magnesium 2.1 06/13/2015: ALT 15; BUN 20.5; Creatinine 1.1; HGB 14.1; Platelets 208; Potassium 4.0; Sodium 141   Lipid Panel    Component Value Date/Time   CHOL 167 04/29/2015 0510   TRIG 75 04/29/2015 0510   TRIG 52 01/13/2006 1012   HDL 57 04/29/2015 0510   CHOLHDL 2.9 04/29/2015 0510   CHOLHDL 3.0 CALC 01/13/2006 1012   VLDL 15 04/29/2015 0510   LDLCALC 95 04/29/2015  0510   LDLDIRECT 174.3 06/04/2009 0000   LDLDIRECT 158.9 01/13/2006 1012    Additional studies/ records that were reviewed today include:  none    ASSESSMENT:    1. Persistent atrial fibrillation ([place])   2. Chronic diastolic heart failure ([place])   3. Aortic regurgitation   4. Benign essential HTN      PLAN:  In order of problems listed above:  1. Persistent atrial fibrillation with controlled VR.  She is having some LE edema, fatigue and SOB which I think are from the atrial fibrillation.  Her CHADS2VASC score is 7.  Continue Apixaban/digoxin/BB and CCB.  She has not missed any doses of Apixaban since March 2nd so will set up for DCCV.  I have explained the risks and benefits of DCCV including MI, CVA, death, aspiration PNA, arrhythmia or bradyarrhythmias requiring temporary transcutaneous pacing.  She understands and wishes to proceed. 2. Chronic diastolic CHF - she has had some DOE and has LE edema which is secondary to dietary indiscretion with sodium as well as loss of atrial kick with afib. Continue BB/CCB.  Start lasix '20mg'$  take 2 tablets daily for 2 days then 1 tablet PRN for edema.  We discussed 2gm sodium diet.   3. Mild to moderate AR - will repeat echo in 1 year.   4. HTN - controlled on BB and CCB    Medication Adjustments/Labs and Tests Ordered: Current medicines are reviewed at length with the patient today.  Concerns regarding medicines are outlined above.  Medication changes, Labs and Tests ordered today are listed in the Patient Instructions below. There are no Patient Instructions on file for this visit.   Lurena Nida, MD  06/17/2015 2:39 PM    [facility] [address], [place], [place]  [address] Phone: [phone]; Fax: [phone]

## 2015-06-17 NOTE — Patient Instructions (Addendum)
Medication Instructions:  Your physician has recommended you make the following change in your medication:  1) START LASIX 20 mg DAILY AS NEEDED for swelling. For the first 2 days, take 2 tablets (for a total of 40 mg).  Labwork: TODAY: BMET, CBC, PT/INR  Testing/Procedures: Your physician has requested that you have an echocardiogram in February, 2018. Echocardiography is a painless test that uses sound waves to create images of your heart. It provides your doctor with information about the size and shape of your heart and how well your heart's chambers and valves are working. This procedure takes approximately one hour. There are no restrictions for this procedure.  Your physician has recommended that you have a Cardioversion (DCCV) on June 24, 2015 at 10:00AM. Electrical Cardioversion uses a jolt of electricity to your heart either through paddles or wired patches attached to your chest. This is a controlled, usually prescheduled, procedure. Defibrillation is done under light anesthesia in the hospital, and you usually go home the day of the procedure. This is done to get your heart back into a normal rhythm. You are not awake for the procedure. Please see the instruction sheet given to you today.  Follow-Up: You have a follow-up appointment with Dr. Radford Pax scheduled 07/30/15 at 3:15 PM.  Any Other Special Instructions Will Be Listed Below (If Applicable).     If you need a refill on your cardiac medications before your next appointment, please call your pharmacy.

## 2015-06-18 ENCOUNTER — Other Ambulatory Visit: Payer: Self-pay | Admitting: General Practice

## 2015-06-18 NOTE — Telephone Encounter (Signed)
Received a tramadol refill request. This is from [facility] and they are requesting 90 days of this medication. Last filled 06-07-15 #45 with 0.

## 2015-06-19 MED ORDER — TRAMADOL HCL 50 MG PO TABS: 50.0000 mg | ORAL_TABLET | Freq: Four times a day (QID) | ORAL | Status: DC | PRN

## 2015-06-19 NOTE — Telephone Encounter (Signed)
Medication filled to pharmacy as requested.   

## 2015-06-20 ENCOUNTER — Ambulatory Visit (HOSPITAL_BASED_OUTPATIENT_CLINIC_OR_DEPARTMENT_OTHER): Payer: Medicare Other | Admitting: Nurse Practitioner

## 2015-06-20 ENCOUNTER — Encounter: Payer: Self-pay | Admitting: Nurse Practitioner

## 2015-06-20 ENCOUNTER — Telehealth: Payer: Self-pay | Admitting: Nurse Practitioner

## 2015-06-20 VITALS — BP 116/84 | HR 69 | Temp 98.0°F | Resp 18 | Wt 146.3 lb

## 2015-06-20 DIAGNOSIS — C50412 Malignant neoplasm of upper-outer quadrant of left female breast: Secondary | ICD-10-CM

## 2015-06-20 DIAGNOSIS — Z170 Estrogen receptor positive status [ER+]: Secondary | ICD-10-CM

## 2015-06-20 DIAGNOSIS — M8580 Other specified disorders of bone density and structure, unspecified site: Secondary | ICD-10-CM | POA: Diagnosis not present

## 2015-06-20 DIAGNOSIS — C50912 Malignant neoplasm of unspecified site of left female breast: Secondary | ICD-10-CM | POA: Diagnosis not present

## 2015-06-20 DIAGNOSIS — E2839 Other primary ovarian failure: Secondary | ICD-10-CM

## 2015-06-20 MED ORDER — ANASTROZOLE 1 MG PO TABS: 1.0000 mg | ORAL_TABLET | Freq: Every day | ORAL | Status: DC

## 2015-06-20 NOTE — Telephone Encounter (Signed)
appt made and avs printed. Mammo sch for [facility] May 9th at 1015 am

## 2015-06-20 NOTE — Telephone Encounter (Signed)
Bone density is sch for May 9 at [place] as well

## 2015-06-20 NOTE — Progress Notes (Signed)
[place]  [phone] [phone]   ID: Patricia [name] OB: [birth year]  MR#: [id number]  OFH#:[id number]  PCP: Patricia Asa, MD GYN:   SU: Patricia [name] OTHER MD: Patricia [name], Patricia [name]  CHIEF COMPLAINT: Estrogen receptor positive breast cancer  TREATMENT: Tamoxifen  BREAST CANCER HISTORY: From the initial intake note 06/14/2013:  Patricia [name] had screening mammography with tomography at [facility] 05/26/2013 showing some architectural distortion in the left breast superiorly. Ultrasound of the left breast 06/02/2013 showed a 2 cm irregular mass superiorly in the left breast, medial to the scar on the skin from a prior biopsy. This mass was palpable. 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  On 06/13/2013 the patient underwent bilateral breast MRI. This showed a 2.8 cm irregular enhancing mass in the superior left breast but no additional suspicious masses, and no abnormal adenopathy, and no findings in the right breast.  The patient's subsequent history is as detailed below.  INTERVAL HISTORY: Maryreturns today for followup of her estrogen receptor positive breast cancer. She has been on tamoxifen since July 2015. She has mild vaginal wetness, but denies hot flashes or other issues. The interval history is remarkable for a hospitalization earlier this year. She was diagnosed with a-fib and suffered a stroke while admitted. She has no residual damage from this event. She is scheduled for a cardioversion early next week. She denies chest pain or palpitations today.  REVIEW OF SYSTEMS: Patricia [name] continues to be regularly physically active by walking and playing golf three times weekly. She has some shortness of breath with exertion. She  denies pain. A detailed review of systems today was otherwise entirely negative.  PAST MEDICAL HISTORY: Past Medical History  Diagnosis Date  . Cough variant asthma   . Osteopenia   . Stricture and stenosis of cervix   . Postmenopausal   . GERD (gastroesophageal reflux disease)   . Disease of pharynx or nasopharynx   . Benign essential HTN   . Allergic rhinitis, cause unspecified   . Diverticulosis   . Internal hemorrhoids   . Hiatal hernia   . Laryngospasm   . 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 Hyperlipidemia   . Glaucoma   . Laryngospasm   . Multinodular goiter   . Chronic cough   . 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 Radiation 10/22/14-11/23/14    Left Breast  . Chronic diastolic heart failure (Patricia [name]) 06/17/2015  . Aortic regurgitation 06/17/2015    Mild to moderate AR by echo 04/2015  . Persistent atrial fibrillation (HCC)     CHADS2VASC score is 7 - on Apixaban    PAST SURGICAL HISTORY: Past Surgical History  Procedure Laterality Date  . Lung cancer surgery  2003    resection carcinoid lingula-lt upper lobe  . Colonoscopy    . Radioactive seed guided mastectomy with axillary sentinel lymph node biopsy Left 06/26/2014    Procedure: RADIOACTIVE SEED GUIDED PARTIAL MASTECTOMY WITH AXILLARY SENTINEL LYMPH NODE BIOPSY;  Surgeon: Patricia Klein, MD;  Location: [facility];  Service: General;  Laterality: Left;  . Breast surgery    . Re-excision of breast lumpectomy Left 08/07/2014    Procedure: RE-EXCISION OF LEFT BREAST LUMPECTOMY;  Surgeon: Patricia Klein, MD;  Location:   SUR[facility]ERY [facility];  Service: General;  Laterality: Left;    FAMILY HISTORY Family History  Problem Relation Age of Onset  . Stroke Mother   . Hypertension Mother   . Hyperlipidemia Mother   . Stroke Father   . Transient ischemic attack Father   . Hyperlipidemia Father   . Hypertension Father   . Heart attack Neg Hx   . Atrial fibrillation Son    the patient's  father died from pneumonia the age of 79, in the setting of dementia. The patient's mother died at the age of 33 following a stroke. The patient had one brother, 4 sisters. There is no history of breast or ovarian cancer in the family  GYNECOLOGIC HISTORY:  Menarche age 47, first live birth age 57, she is [place] P2. She did not use hormone replacement at menopause. She never took birth control pills.   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 She does a little bit of swelling on the side. Her husband Patricia [name] is retired from the [employer] (Designer, fashion/clothing) and then worked for [employer]. He is an avid Animator. Son Patricia [name] lives in [place] and is an Art gallery manager. Daughter Patricia [name] lives in [place] and is a Astronomer. The patient has 5 grandchildren. She attends the [employer]    ADVANCED DIRECTIVES: In place   HEALTH MAINTENANCE: Social History  Substance Use Topics  . Smoking status: Former Smoker    Quit date: 06/15/1963  . Smokeless tobacco: Never Used  . Alcohol Use: 0.0 oz/week    0 Standard drinks or equivalent per week     Comment: occasional wine     Colonoscopy:  PAP:  Bone density: 07/12/2013 at [facility], T score of -1.8 at the femoral neck  Lipid panel:  Allergies  Allergen Reactions  . Combigan [Brimonidine Tartrate-Timolol]     Eyes itch, reddened  . Sulfa Antibiotics Rash    Current Outpatient Prescriptions  Medication Sig Dispense Refill  . albuterol (PROVENTIL HFA;VENTOLIN HFA) 108 (90 Base) MCG/ACT inhaler Inhale 2 puffs into the lungs every 6 (six) hours as needed for wheezing or shortness of breath.    Marland Kitchen apixaban (ELIQUIS) 5 MG TABS tablet Take 1 tablet (5 mg total) by mouth 2 (two) times daily. 180 tablet 3  . ARMOUR THYROID 15 MG tablet TAKE 1 TABLET DAILY 90 tablet 1  . bimatoprost (LUMIGAN) 0.03 % ophthalmic drops Place 1 drop into both eyes at bedtime.     . digoxin (LANOXIN) 0.125 MG tablet Take 1 tablet (0.125 mg  total) by mouth daily. 90 tablet 3  . diltiazem (CARDIZEM CD) 180 MG 24 hr capsule Take 1 capsule (180 mg total) by mouth daily. 90 capsule 3  . dorzolamide (TRUSOPT) 2 % ophthalmic solution Place 1 drop into both eyes 2 (two) times daily.    . fluticasone-salmeterol (ADVAIR HFA) 230-21 MCG/ACT inhaler INHALE 2 PUFFS INTO THE LUNGS TWICE A DAY 3 Inhaler 2  . furosemide (LASIX) 20 MG tablet Take 1 tablet (20 mg total) by mouth daily as needed. 30 tablet 6  . gabapentin (NEURONTIN) 400 MG capsule Take 1 capsule (400 mg total) by mouth 2 (two) times daily. 180 capsule 1  . loratadine (CLARITIN) 10 MG tablet Take 10 mg by mouth daily as needed for allergies.    . metoprolol tartrate (LOPRESSOR) 25 MG tablet Take 1 tablet (25 mg total) by mouth 2 (two) times daily. 180 tablet 3  . Multiple Vitamins-Iron (MULTIVITAMIN/IRON) TABS Take  1 tablet by mouth daily.     Marland Kitchen omeprazole (PRILOSEC) 20 MG capsule TAKE 1 CAPSULE DAILY 30 MINUTES BEFORE A MEAL 90 capsule 1  . simvastatin (ZOCOR) 80 MG tablet   0  . tamoxifen (NOLVADEX) 20 MG tablet Take 1 tablet (20 mg total) by mouth daily. 90 tablet 4  . traMADol (ULTRAM) 50 MG tablet Take 1 tablet (50 mg total) by mouth every 6 (six) hours as needed. for pain 90 tablet 0   No current facility-administered medications for this visit.    OBJECTIVE: Middle-aged white woman who appears well Filed Vitals:   06/20/15 1017  BP: 116/84  Pulse: 69  Temp: 98 F (36.7 C)  Resp: 18     Body mass index is 29.53 kg/(m^2).    ECOG FS:0 - Asymptomatic  Skin: warm, dry  HEENT: sclerae anicteric, conjunctivae pink, oropharynx clear. No thrush or mucositis.  Lymph Nodes: No cervical or supraclavicular lymphadenopathy  Lungs: clear to auscultation bilaterally, no rales, wheezes, or rhonci  Heart: regular rate and rhythm  Abdomen: round, soft, non tender, positive bowel sounds  Musculoskeletal: No focal spinal tenderness, no peripheral edema  Neuro: non focal, well  oriented, positive affect  Breasts: left breast status post lumpectomy and radiation. No evidence of recurrent disease. Left axilla benign. Right breast unremarkable.  LAB RESULTS:  CMP     Component Value Date/Time   NA 140 06/17/2015 1521   NA 141 06/13/2015 1340   K 4.0 06/17/2015 1521   K 4.0 06/13/2015 1340   CL 107 06/17/2015 1521   CO2 23 06/17/2015 1521   CO2 25 06/13/2015 1340   GLUCOSE 125* 06/17/2015 1521   GLUCOSE 124 06/13/2015 1340   GLUCOSE 88 01/13/2006 1012   BUN 22 06/17/2015 1521   BUN 20.5 06/13/2015 1340   CREATININE 1.14* 06/17/2015 1521   CREATININE 1.1 06/13/2015 1340   CREATININE 1.20* 05/01/2015 0420   CALCIUM 8.5* 06/17/2015 1521   CALCIUM 8.7 06/13/2015 1340   PROT 6.3* 06/13/2015 1340   PROT 6.6 02/14/2015 1032   ALBUMIN 3.5 06/13/2015 1340   ALBUMIN 4.0 02/14/2015 1032   AST 17 06/13/2015 1340   AST 17 02/14/2015 1032   ALT 15 06/13/2015 1340   ALT 15 02/14/2015 1032   ALKPHOS 57 06/13/2015 1340   ALKPHOS 56 02/14/2015 1032   BILITOT 0.36 06/13/2015 1340   BILITOT 0.5 02/14/2015 1032   GFRNONAA 43* 05/01/2015 0420   GFRAA 50* 05/01/2015 0420    I No results found for: SPEP  Lab Results  Component Value Date   WBC 7.2 06/17/2015   NEUTROABS 4752 06/17/2015   HGB 13.5 06/17/2015   HCT 39.9 06/17/2015   MCV 95.0 06/17/2015   PLT 221 06/17/2015      Chemistry      Component Value Date/Time   NA 140 06/17/2015 1521   NA 141 06/13/2015 1340   K 4.0 06/17/2015 1521   K 4.0 06/13/2015 1340   CL 107 06/17/2015 1521   CO2 23 06/17/2015 1521   CO2 25 06/13/2015 1340   BUN 22 06/17/2015 1521   BUN 20.5 06/13/2015 1340   CREATININE 1.14* 06/17/2015 1521   CREATININE 1.1 06/13/2015 1340   CREATININE 1.20* 05/01/2015 0420      Component Value Date/Time   CALCIUM 8.5* 06/17/2015 1521   CALCIUM 8.7 06/13/2015 1340   ALKPHOS 57 06/13/2015 1340   ALKPHOS 56 02/14/2015 1032   AST 17 06/13/2015 1340   AST 17 02/14/2015  1032   ALT  15 06/13/2015 1340   ALT 15 02/14/2015 1032   BILITOT 0.36 06/13/2015 1340   BILITOT 0.5 02/14/2015 1032       No results found for: LABCA2  No components found for: LABCA125   Recent Labs Lab 06/17/15 1521  INR 1.41    Urinalysis    Component Value Date/Time   COLORURINE YELLOW 04/28/2015 1114   APPEARANCEUR CLEAR 04/28/2015 1114   LABSPEC 1.010 04/28/2015 1114   PHURINE 6.5 04/28/2015 1114   GLUCOSEU NEGATIVE 04/28/2015 1114   HGBUR TRACE* 04/28/2015 1114   HGBUR negative 05/20/2007 0942   BILIRUBINUR NEGATIVE 04/28/2015 1114   KETONESUR NEGATIVE 04/28/2015 1114   PROTEINUR NEGATIVE 04/28/2015 1114   UROBILINOGEN 0.2 05/20/2007 0942   NITRITE NEGATIVE 04/28/2015 1114   LEUKOCYTESUR NEGATIVE 04/28/2015 1114    STUDIES: No results found.  ASSESSMENT: 75 y.o. [place] woman status post left breast biopsy for 08/19/2013 for a clinical T2 N0, stage IIA invasive lobular breast cancer, estrogen receptor positive, progesterone receptor negative, with an MIB-1 of 20%. There was not sufficient tissue for HER-2 testing.  (1) neoadjuvant anastrozole started mid April 2015, discontinued early June 2015 with poor tolerance  (2) tamoxifen started first week in July 2015. Discontinued 06/20/15 after stroke. Anastrozole to start 07/01/15.   (3) osteopenia with a T score of -1.8 at the femoral necks 07/12/2013  (4) left lumpectomy 06/26/2014 showed a pT1c pN0, stage IA invasive lobular carcinoma, grade 1, HER-2 negative, with positive margins   (a) additional surgery 08/07/2014 did not completely clear margins, but no further surgery was advised  (5) radiation completed 11/22/2014   PLAN: Arwa is doing well today as far as her breast cancer is concerned. Her breast exam was normal. She is due for a repeat mammogram this month, so I have placed orders for this to be performed at [facility].   Due to her history of afib and acute CVA, I am discontinuing the tamoxifen. She will begin  anastrozole in its place in 2 weeks. This drug was sent to her pharmacy today. We reviewed potential side effects such as hot flashes, vagina dryness, and arthralgias. She will have a bone density scan performed along with her mammogram.   Patricia [name] will return in July for follow up with Dr. [name] to assess her tolerance to this drug. She understands and agrees with this plan. She knows the goal of treatment in her case is cure. She has been encouraged to call with any issues that might arise before her next visit here.    Patricia Panda, NP   06/20/2015 10:59 AM

## 2015-06-20 NOTE — Telephone Encounter (Signed)
lvm to inform pt of Sept appt change to July 19 at 230 pm

## 2015-06-24 ENCOUNTER — Ambulatory Visit (HOSPITAL_COMMUNITY): Payer: Medicare Other | Admitting: Certified Registered Nurse Anesthetist

## 2015-06-24 ENCOUNTER — Ambulatory Visit (HOSPITAL_COMMUNITY)
Admission: RE | Admit: 2015-06-24 | Discharge: 2015-06-24 | Disposition: A | Discharge status: Home / Self Care | Payer: Medicare Other | Source: Ambulatory Visit | Attending: Cardiology | Admitting: Cardiology

## 2015-06-24 ENCOUNTER — Encounter (HOSPITAL_COMMUNITY)
Admission: RE | Disposition: A | Discharge status: Home / Self Care | Payer: Self-pay | Source: Ambulatory Visit | Attending: Cardiology

## 2015-06-24 ENCOUNTER — Other Ambulatory Visit: Payer: Self-pay | Admitting: Nurse Practitioner

## 2015-06-24 ENCOUNTER — Encounter (HOSPITAL_COMMUNITY): Payer: Self-pay | Admitting: *Deleted

## 2015-06-24 ENCOUNTER — Telehealth: Payer: Self-pay | Admitting: Cardiology

## 2015-06-24 DIAGNOSIS — I4819 Other persistent atrial fibrillation: Secondary | ICD-10-CM | POA: Insufficient documentation

## 2015-06-24 DIAGNOSIS — I5032 Chronic diastolic (congestive) heart failure: Secondary | ICD-10-CM | POA: Diagnosis not present

## 2015-06-24 DIAGNOSIS — I10 Essential (primary) hypertension: Secondary | ICD-10-CM | POA: Diagnosis not present

## 2015-06-24 DIAGNOSIS — K219 Gastro-esophageal reflux disease without esophagitis: Secondary | ICD-10-CM | POA: Diagnosis not present

## 2015-06-24 DIAGNOSIS — Z85118 Personal history of other malignant neoplasm of bronchus and lung: Secondary | ICD-10-CM | POA: Diagnosis not present

## 2015-06-24 DIAGNOSIS — I480 Paroxysmal atrial fibrillation: Secondary | ICD-10-CM

## 2015-06-24 DIAGNOSIS — Z8673 Personal history of transient ischemic attack (TIA), and cerebral infarction without residual deficits: Secondary | ICD-10-CM | POA: Diagnosis not present

## 2015-06-24 DIAGNOSIS — I509 Heart failure, unspecified: Secondary | ICD-10-CM | POA: Diagnosis not present

## 2015-06-24 DIAGNOSIS — E039 Hypothyroidism, unspecified: Secondary | ICD-10-CM | POA: Diagnosis not present

## 2015-06-24 DIAGNOSIS — Z7901 Long term (current) use of anticoagulants: Secondary | ICD-10-CM | POA: Insufficient documentation

## 2015-06-24 DIAGNOSIS — I481 Persistent atrial fibrillation: Secondary | ICD-10-CM | POA: Insufficient documentation

## 2015-06-24 DIAGNOSIS — E785 Hyperlipidemia, unspecified: Secondary | ICD-10-CM | POA: Diagnosis not present

## 2015-06-24 DIAGNOSIS — I110 Hypertensive heart disease with heart failure: Secondary | ICD-10-CM | POA: Insufficient documentation

## 2015-06-24 DIAGNOSIS — I4891 Unspecified atrial fibrillation: Secondary | ICD-10-CM | POA: Diagnosis present

## 2015-06-24 DIAGNOSIS — Z87891 Personal history of nicotine dependence: Secondary | ICD-10-CM | POA: Diagnosis not present

## 2015-06-24 HISTORY — PX: CARDIOVERSION: SHX1299

## 2015-06-24 SURGERY — CARDIOVERSION
Anesthesia: Monitor Anesthesia Care

## 2015-06-24 MED ORDER — HYDROCORTISONE 1 % EX CREA: 1.0000 "application " | TOPICAL_CREAM | Freq: Three times a day (TID) | CUTANEOUS | Status: DC | PRN

## 2015-06-24 MED ORDER — LIDOCAINE HCL (CARDIAC) 20 MG/ML IV SOLN
INTRAVENOUS | Status: DC | PRN
  Administered 2015-06-24: 100 mg via INTRAVENOUS

## 2015-06-24 MED ORDER — SODIUM CHLORIDE 0.9% FLUSH: 3.0000 mL | INTRAVENOUS | Status: DC | PRN

## 2015-06-24 MED ORDER — SODIUM CHLORIDE 0.9% FLUSH: 3.0000 mL | Freq: Two times a day (BID) | INTRAVENOUS | Status: DC

## 2015-06-24 MED ORDER — SODIUM CHLORIDE 0.9 % IV SOLN
250.0000 mL | INTRAVENOUS | Status: DC
  Administered 2015-06-24: 250 mL via INTRAVENOUS

## 2015-06-24 MED ORDER — SODIUM CHLORIDE 0.9 % IV SOLN: 250.0000 mL | INTRAVENOUS | Status: DC

## 2015-06-24 MED ORDER — PROPOFOL 10 MG/ML IV BOLUS
INTRAVENOUS | Status: DC | PRN
  Administered 2015-06-24: 50 mg via INTRAVENOUS

## 2015-06-24 NOTE — Anesthesia Preprocedure Evaluation (Signed)
Anesthesia Evaluation  Patient identified by MRN, date of birth, ID band Patient awake    Reviewed: Allergy & Precautions, NPO status , Patient's Chart, lab work & pertinent test results  Airway Mallampati: II  TM Distance: >3 FB Neck ROM: Full    Dental no notable dental hx.    Pulmonary asthma , former smoker,    Pulmonary exam normal breath sounds clear to auscultation       Cardiovascular hypertension, +CHF  Normal cardiovascular exam+ dysrhythmias Atrial Fibrillation + Valvular Problems/Murmurs (mild to moderate) AI  Rhythm:Regular Rate:Normal     Neuro/Psych negative neurological ROS  negative psych ROS   GI/Hepatic negative GI ROS, Neg liver ROS,   Endo/Other  negative endocrine ROS  Renal/GU negative Renal ROS  negative genitourinary   Musculoskeletal negative musculoskeletal ROS (+)   Abdominal   Peds negative pediatric ROS (+)  Hematology negative hematology ROS (+)   Anesthesia Other Findings   Reproductive/Obstetrics negative OB ROS                             Anesthesia Physical Anesthesia Plan  ASA: III  Anesthesia Plan: MAC   Post-op Pain Management:    Induction: Intravenous  Airway Management Planned: Nasal Cannula  Additional Equipment:   Intra-op Plan:   Post-operative Plan:   Informed Consent: I have reviewed the patients History and Physical, chart, labs and discussed the procedure including the risks, benefits and alternatives for the proposed anesthesia with the patient or authorized representative who has indicated his/her understanding and acceptance.   Dental advisory given  Plan Discussed with: CRNA  Anesthesia Plan Comments:         Anesthesia Quick Evaluation

## 2015-06-24 NOTE — Interval H&P Note (Signed)
History and Physical Interval Note:  06/24/2015 11:58 AM  Patricia [name]  has presented today for surgery, with the diagnosis of AFIB  The various methods of treatment have been discussed with the patient and family. After consideration of risks, benefits and other options for treatment, the patient has consented to  Procedure(s): CARDIOVERSION (N/A) as a surgical intervention .  The patient's history has been reviewed, patient examined, no change in status, stable for surgery.  I have reviewed the patient's chart and labs.  Questions were answered to the patient's satisfaction.     Patricia [name] R

## 2015-06-24 NOTE — Telephone Encounter (Signed)
Please call patient with followup appt post cardioversion with APP in 2 weeks

## 2015-06-24 NOTE — Transfer of Care (Signed)
Immediate Anesthesia Transfer of Care Note  Patient: Patricia [name]  Procedure(s) Performed: Procedure(s): CARDIOVERSION (N/A)  Patient Location: Endoscopy Unit  Anesthesia Type:MAC  Level of Consciousness: sedated, patient cooperative and responds to stimulation  Airway & Oxygen Therapy: Patient Spontanous Breathing and Patient connected to nasal cannula oxygen  Post-op Assessment: Report given to RN and Post -op Vital signs reviewed and stable  Post vital signs: Reviewed and stable  Last Vitals:  Filed Vitals:   06/24/15 1005  BP: 126/80  Pulse: 80  Temp: 36.6 C  Resp: 18    Complications: No apparent anesthesia complications

## 2015-06-24 NOTE — CV Procedure (Signed)
   Electrical Cardioversion Procedure Note Patricia [name] [id number] 03-29-1940  Procedure: Electrical Cardioversion Indications:  Atrial Fibrillation  Time Out: Verified patient identification, verified procedure,medications/allergies/relevent history reviewed, required imaging and test results available.  Performed  Procedure Details  The patient was NPO after midnight. Anesthesia was administered at the beside  by Dr.Carigan with '50mg'$  of propofol and Lidocaine '100mg'$ .  Cardioversion was done with synchronized biphasic defibrillation with AP pads with 150watts.  The patient converted to normal sinus rhythm. The patient tolerated the procedure well   IMPRESSION:  Successful cardioversion of atrial fibrillation    Patricia [name] 06/24/2015, 11:59 AM

## 2015-06-24 NOTE — Anesthesia Postprocedure Evaluation (Signed)
Anesthesia Post Note  Patient: Patricia [name]  Procedure(s) Performed: Procedure(s) (LRB): CARDIOVERSION (N/A)  Patient location during evaluation: Endoscopy Anesthesia Type: MAC Level of consciousness: awake and alert Pain management: pain level controlled Vital Signs Assessment: post-procedure vital signs reviewed and stable Respiratory status: spontaneous breathing, nonlabored ventilation, respiratory function stable and patient connected to nasal cannula oxygen Cardiovascular status: stable and blood pressure returned to baseline Anesthetic complications: no    Last Vitals:  Filed Vitals:   06/24/15 1220 06/24/15 1230  BP: 120/69 138/81  Pulse: 71 69  Temp:    Resp: 21 24    Last Pain: There were no vitals filed for this visit.               Montez Hageman

## 2015-06-24 NOTE — H&P (View-Only) (Signed)
Cardiology Office Note    Date:  06/17/2015   ID:  Patricia [name], DOB May 17, 1940, MRN [id number]  PCP:  Patricia Asa, MD  Cardiologist:  Patricia Margarita, MD   Chief Complaint  Patient presents with  . Atrial Fibrillation  . Congestive Heart Failure    History of Present Illness:  Patricia [name] is a 75 y.o. female with a history of lung CA s/p resection (2003), breast CA s/p lumpectomy/Tamoxifen/XRT, GERD, hypothyroidism, HTN and HLD who presents for follow up after a recent admission for chest pain, CHF and afib with RVR as well as acute CVA.   She presented to [facility] on 04/27/15 with chest pain, SOB, orthopnea as well as some confusion and word finding difficulties. 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 CXR with CHF and BNP elevated. She was given a short course of IV lasix with improvement and started on IV heparin gtt. She underwent nuclear stress testing on 04/29/15 which was low risk for ischemia; EF 60%. 2D ECHO showed normal LV function, no RWMA, high ventricular filling pressures, mild-mod AR, severe LA dilation, mild TR. She was eventually transitioned to Eliquis '5mg'$  BID and started on oral lopressor 12.'5mg'$  BID, digoxin 0.'125mg'$  daily and Cardizem '180mg'$  daily. She had some mild renal insufficiency creat did go up from 0.91--> 1.2 during her admission.  Today she presents to clinic for follow up.  She denies any chest pain,  palpitations, orthopnea, PND. No dizziness or syncope. She has been taking all of her medicines as prescribed.  She has had some LE edema since coming home.  She has been using the salt shaker and today her legs are swollen more but she ate Ham yesterday for Easter.  She feels more fatigued and has noticed some SOB since having afib that she did not have in the past.       Past Medical History  Diagnosis Date  . Cough variant asthma   . Osteopenia   . Stricture and stenosis  of cervix   . Postmenopausal   . GERD (gastroesophageal reflux disease)   . Disease of pharynx or nasopharynx   . Benign essential HTN   . Allergic rhinitis, cause unspecified   . Diverticulosis   . Internal hemorrhoids   . Hiatal hernia   . Laryngospasm   . 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 Hyperlipidemia   . Glaucoma   . Laryngospasm   . Multinodular goiter   . Chronic cough   . 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 Radiation 10/22/14-11/23/14    Left Breast  . Chronic diastolic heart failure ([place]) 06/17/2015  . Aortic regurgitation 06/17/2015    Mild to moderate AR by echo 04/2015  . Persistent atrial fibrillation (HCC)     CHADS2VASC score is 7 - on Apixaban    Past Surgical History  Procedure Laterality Date  . Lung cancer surgery  2003    resection carcinoid lingula-lt upper lobe  . Colonoscopy    . Radioactive seed guided mastectomy with axillary sentinel lymph node biopsy Left 06/26/2014    Procedure: RADIOACTIVE SEED GUIDED PARTIAL MASTECTOMY WITH AXILLARY SENTINEL LYMPH NODE BIOPSY;  Surgeon: Patricia Klein, MD;  Location: [facility];  Service: General;  Laterality: Left;  . Breast surgery    . Re-excision of breast lumpectomy Left 08/07/2014    Procedure: RE-EXCISION OF  LEFT BREAST LUMPECTOMY;  Surgeon: Patricia Klein, MD;  Location: [place];  Service: General;  Laterality: Left;    Current Medications: Outpatient Prescriptions Prior to Visit  Medication Sig Dispense Refill  . albuterol (PROVENTIL HFA;VENTOLIN HFA) 108 (90 Base) MCG/ACT inhaler Inhale 2 puffs into the lungs every 6 (six) hours as needed for wheezing or shortness of breath.    Marland Kitchen apixaban (ELIQUIS) 5 MG TABS tablet Take 1 tablet (5 mg total) by mouth 2 (two) times daily. 180 tablet 3  . ARMOUR THYROID 15 MG tablet TAKE 1 TABLET DAILY 90 tablet 1  . bimatoprost (LUMIGAN) 0.03 % ophthalmic drops Place 1 drop into both eyes at bedtime.     . digoxin  (LANOXIN) 0.125 MG tablet Take 1 tablet (0.125 mg total) by mouth daily. 90 tablet 3  . diltiazem (CARDIZEM CD) 180 MG 24 hr capsule Take 1 capsule (180 mg total) by mouth daily. 90 capsule 3  . dorzolamide (TRUSOPT) 2 % ophthalmic solution Place 1 drop into both eyes 2 (two) times daily.    . fluticasone-salmeterol (ADVAIR HFA) 230-21 MCG/ACT inhaler INHALE 2 PUFFS INTO THE LUNGS TWICE A DAY 3 Inhaler 2  . gabapentin (NEURONTIN) 400 MG capsule Take 1 capsule (400 mg total) by mouth 2 (two) times daily. 180 capsule 1  . loratadine (CLARITIN) 10 MG tablet Take 10 mg by mouth daily as needed for allergies.    . metoprolol tartrate (LOPRESSOR) 25 MG tablet Take 1 tablet (25 mg total) by mouth 2 (two) times daily. 180 tablet 3  . Multiple Vitamins-Iron (MULTIVITAMIN/IRON) TABS Take 1 tablet by mouth daily.     Marland Kitchen omeprazole (PRILOSEC) 20 MG capsule TAKE 1 CAPSULE DAILY 30 MINUTES BEFORE A MEAL 90 capsule 1  . simvastatin (ZOCOR) 40 MG tablet Take 1 tablet (40 mg total) by mouth at bedtime. 90 tablet 3  . tamoxifen (NOLVADEX) 20 MG tablet Take 1 tablet (20 mg total) by mouth daily. 90 tablet 4  . traMADol (ULTRAM) 50 MG tablet Take 1 tablet (50 mg total) by mouth every 6 (six) hours as needed. for pain 45 tablet 0   No facility-administered medications prior to visit.     Allergies:   Combigan and Sulfa antibiotics   Social History   Social History  . Marital Status: Married    Spouse Name: N/A  . Number of Children: N/A  . Years of Education: N/A   Social History Main Topics  . Smoking status: Former Smoker    Quit date: 06/15/1963  . Smokeless tobacco: Never Used  . Alcohol Use: 0.0 oz/week    0 Standard drinks or equivalent per week     Comment: occasional wine  . Drug Use: No  . Sexual Activity: Not Asked   Other Topics Concern  . None   Social History Narrative     Family History:  The patient's family history includes Atrial fibrillation in her son; Hyperlipidemia in her  father and mother; Hypertension in her father and mother; Stroke in her father and mother; Transient ischemic attack in her father. There is no history of Heart attack.   ROS:   Please see the history of present illness.    Review of Systems  Constitution: Positive for malaise/fatigue.  HENT: Negative.   Eyes: Negative.   Cardiovascular: Positive for dyspnea on exertion and leg swelling.  Respiratory: Negative.   Skin: Negative.   Musculoskeletal: Negative.   Gastrointestinal: Negative.   Genitourinary: Negative.   Neurological:  Negative.   Psychiatric/Behavioral: Negative.    All other systems reviewed and are negative.   PHYSICAL EXAM:   VS:  BP 122/62 mmHg  Pulse 74  Ht '4\' 11"'$  (1.499 m)  Wt 147 lb 6.4 oz (66.86 kg)  BMI 29.76 kg/m2   GEN: Well nourished, well developed, in no acute distress HEENT: normal Neck: no JVD, carotid bruits, or masses Cardiac: irregularly irregular; no murmurs, rubs, or gallops.  Intact distal pulses bilaterally. 1+ edema Respiratory:  clear to auscultation bilaterally, normal work of breathing GI: soft, nontender, nondistended, + BS MS: no deformity or atrophy Skin: warm and dry, no rash Neuro:  Alert and Oriented x 3, Strength and sensation are intact Psych: euthymic mood, full affect  Wt Readings from Last 3 Encounters:  06/17/15 147 lb 6.4 oz (66.86 kg)  06/04/15 149 lb 6.4 oz (67.767 kg)  05/09/15 145 lb 4 oz (65.885 kg)      Studies/Labs Reviewed:   EKG:  EKG is not ordered today.    Recent Labs: 04/27/2015: B Natriuretic Peptide 404.7*; TSH 2.802 05/01/2015: Magnesium 2.1 06/13/2015: ALT 15; BUN 20.5; Creatinine 1.1; HGB 14.1; Platelets 208; Potassium 4.0; Sodium 141   Lipid Panel    Component Value Date/Time   CHOL 167 04/29/2015 0510   TRIG 75 04/29/2015 0510   TRIG 52 01/13/2006 1012   HDL 57 04/29/2015 0510   CHOLHDL 2.9 04/29/2015 0510   CHOLHDL 3.0 CALC 01/13/2006 1012   VLDL 15 04/29/2015 0510   LDLCALC 95 04/29/2015  0510   LDLDIRECT 174.3 06/04/2009 0000   LDLDIRECT 158.9 01/13/2006 1012    Additional studies/ records that were reviewed today include:  none    ASSESSMENT:    1. Persistent atrial fibrillation ([place])   2. Chronic diastolic heart failure ([place])   3. Aortic regurgitation   4. Benign essential HTN      PLAN:  In order of problems listed above:  1. Persistent atrial fibrillation with controlled VR.  She is having some LE edema, fatigue and SOB which I think are from the atrial fibrillation.  Her CHADS2VASC score is 7.  Continue Apixaban/digoxin/BB and CCB.  She has not missed any doses of Apixaban since March 2nd so will set up for DCCV.  I have explained the risks and benefits of DCCV including MI, CVA, death, aspiration PNA, arrhythmia or bradyarrhythmias requiring temporary transcutaneous pacing.  She understands and wishes to proceed. 2. Chronic diastolic CHF - she has had some DOE and has LE edema which is secondary to dietary indiscretion with sodium as well as loss of atrial kick with afib. Continue BB/CCB.  Start lasix '20mg'$  take 2 tablets daily for 2 days then 1 tablet PRN for edema.  We discussed 2gm sodium diet.   3. Mild to moderate AR - will repeat echo in 1 year.   4. HTN - controlled on BB and CCB    Medication Adjustments/Labs and Tests Ordered: Current medicines are reviewed at length with the patient today.  Concerns regarding medicines are outlined above.  Medication changes, Labs and Tests ordered today are listed in the Patient Instructions below. There are no Patient Instructions on file for this visit.   Lurena Nida, MD  06/17/2015 2:39 PM    [facility] [place], [place], [place]  [address] Phone: [phone]; Fax: [phone]

## 2015-06-24 NOTE — Discharge Instructions (Signed)
Electrical Cardioversion, Care After °Refer to this sheet in the next few weeks. These instructions provide you with information on caring for yourself after your procedure. Your health care provider may also give you more specific instructions. Your treatment has been planned according to current medical practices, but problems sometimes occur. Call your health care provider if you have any problems or questions after your procedure. °WHAT TO EXPECT AFTER THE PROCEDURE °After your procedure, it is typical to have the following sensations: °· Some redness on the skin where the shocks were delivered. If this is tender, a sunburn lotion or hydrocortisone cream may help. °· Possible return of an abnormal heart rhythm within hours or days after the procedure. °HOME CARE INSTRUCTIONS °· Take medicines only as directed by your health care provider. Be sure you understand how and when to take your medicine. °· Learn how to feel your pulse and check it often. °· Limit your activity for 48 hours after the procedure or as directed by your health care provider. °· Avoid or minimize caffeine and other stimulants as directed by your health care provider. °SEEK MEDICAL CARE IF: °· You feel like your heart is beating too fast or your pulse is not regular. °· You have any questions about your medicines. °· You have bleeding that will not stop. °SEEK IMMEDIATE MEDICAL CARE IF: °· You are dizzy or feel faint. °· It is hard to breathe or you feel short of breath. °· There is a change in discomfort in your chest. °· Your speech is slurred or you have trouble moving an arm or leg on one side of your body. °· You get a serious muscle cramp that does not go away. °· Your fingers or toes turn cold or blue. °  °This information is not intended to replace advice given to you by your health care provider. Make sure you discuss any questions you have with your health care provider. °  °Document Released: 12/07/2012 Document Revised: 03/09/2014  Document Reviewed: 12/07/2012 °Elsevier Interactive Patient Education ©2016 [employer] Inc[employer] ° °

## 2015-06-26 ENCOUNTER — Encounter (HOSPITAL_COMMUNITY): Payer: Self-pay | Admitting: Cardiology

## 2015-07-09 DIAGNOSIS — Z853 Personal history of malignant neoplasm of breast: Secondary | ICD-10-CM | POA: Diagnosis not present

## 2015-07-09 DIAGNOSIS — M8589 Other specified disorders of bone density and structure, multiple sites: Secondary | ICD-10-CM | POA: Diagnosis not present

## 2015-07-09 LAB — HM DEXA SCAN

## 2015-07-10 ENCOUNTER — Encounter: Payer: Self-pay | Admitting: Gastroenterology

## 2015-07-12 ENCOUNTER — Telehealth: Payer: Self-pay | Admitting: Cardiology

## 2015-07-12 NOTE — Telephone Encounter (Signed)
Pt c/o swelling: STAT is pt has developed SOB within 24 hours  1. How long have you been experiencing swelling? This am- 5/12  2. Where is the swelling located? feet  3.  Are you currently taking a "fluid pill"? Normally takes fluid pill as needed.   4.  Are you currently SOB? Not really- some palp  5.  Have you traveled recently? no

## 2015-07-12 NOTE — Telephone Encounter (Signed)
Pt called to report that last night while lying down, she had a "funny feeling" in her chest that felt like fluttering. This lasted about 10-15 minutes and was accompanied with slight SOB. She st her heart was not racing. After the episode was over, she felt better and was able to sleep.  This was the first episode she has had since her DCCV in April. She has had no fluttering since. She also complains of swelling in her ankles/feet that started today. There is no indentation when she presses on the swollen parts. They are not red and they do not hurt. She is still able to easily put on her shoes. Instructed patient to take PRN Lasix today and tomorrow. She st she had not taken any doses in about a week.  Instructed her to elevate legs while sitting and to limit salt in diet.   To Dr. Radford Pax for further instructions.

## 2015-07-13 NOTE — Telephone Encounter (Signed)
Agree with recommendations.  She needs to call if swelling does not resolve or if she has any further flutterint

## 2015-07-15 NOTE — Telephone Encounter (Signed)
Left message to call back  

## 2015-07-16 ENCOUNTER — Encounter: Payer: Self-pay | Admitting: General Practice

## 2015-07-17 NOTE — Telephone Encounter (Signed)
Patient st she feels much better since taking her PRN Lasix over the weekend and her swelling is down. She st she cannot tell if she is actually out of NSR, but sometimes gets this "mental feeling" that she is out of rhythm.  Instructed her to call if symptoms return. Confirmed OV with Dr. Radford Pax 5/30. Patient was grateful for call.

## 2015-07-30 ENCOUNTER — Ambulatory Visit (INDEPENDENT_AMBULATORY_CARE_PROVIDER_SITE_OTHER): Payer: Medicare Other | Admitting: Cardiology

## 2015-07-30 ENCOUNTER — Encounter: Payer: Self-pay | Admitting: Cardiology

## 2015-07-30 VITALS — BP 116/73 | HR 64 | Ht 59.0 in | Wt 140.8 lb

## 2015-07-30 DIAGNOSIS — I5032 Chronic diastolic (congestive) heart failure: Secondary | ICD-10-CM | POA: Diagnosis not present

## 2015-07-30 DIAGNOSIS — I480 Paroxysmal atrial fibrillation: Secondary | ICD-10-CM | POA: Diagnosis not present

## 2015-07-30 DIAGNOSIS — I351 Nonrheumatic aortic (valve) insufficiency: Secondary | ICD-10-CM

## 2015-07-30 DIAGNOSIS — I10 Essential (primary) hypertension: Secondary | ICD-10-CM | POA: Diagnosis not present

## 2015-07-30 DIAGNOSIS — E785 Hyperlipidemia, unspecified: Secondary | ICD-10-CM

## 2015-07-30 DIAGNOSIS — I639 Cerebral infarction, unspecified: Secondary | ICD-10-CM

## 2015-07-30 MED ORDER — ATORVASTATIN CALCIUM 40 MG PO TABS: 40.0000 mg | ORAL_TABLET | Freq: Every day | ORAL | Status: DC

## 2015-07-30 MED ORDER — AMIODARONE HCL 200 MG PO TABS: 200.0000 mg | ORAL_TABLET | Freq: Two times a day (BID) | ORAL | Status: DC

## 2015-07-30 NOTE — Progress Notes (Signed)
Cardiology Office Note    Date:  07/30/2015   ID:  Patricia [name], DOB Dec 28, 1940, MRN [id number]  PCP:  Annye Asa, MD  Cardiologist:  Fransico Him, MD   Chief Complaint  Patient presents with  . Atrial Fibrillation  . Hypertension    History of Present Illness:  Patricia [name] is a 75 y.o. female with a history of lung CA s/p resection (2003), breast CA s/p lumpectomy/Tamoxifen/XRT, GERD, hypothyroidism, HTN and HLD who presents for follow up. 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  She underwent DCCV to NSR on 06/24/2015.  Today she presents to clinic for follow up. She says that about 2 weeks ago while laying in bed, she felt her heart flip flop and since then she has felt SOB.  She denies any chest pain, palpitations, orthopnea, PND. No dizziness or syncope. She has been taking all of her medicines as prescribed.    Past Medical History  Diagnosis Date  . Cough variant asthma   . Osteopenia   . Stricture and stenosis of cervix   . Postmenopausal   . GERD (gastroesophageal reflux disease)   . Disease of pharynx or nasopharynx   . Benign essential HTN   . Allergic rhinitis, cause unspecified   . Diverticulosis   . Internal hemorrhoids   . Hiatal hernia   . Laryngospasm   . 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 Hyperlipidemia   . Glaucoma   . Laryngospasm   . Multinodular goiter   . Chronic cough   . 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 Radiation 10/22/14-11/23/14    Left Breast  . Chronic diastolic heart failure ([place]) 06/17/2015  . Aortic regurgitation 06/17/2015    Mild to moderate AR by echo 04/2015  . PAF (paroxysmal atrial fibrillation) (HCC)     CHADS2VASC score is 7 - on Apixaban.  S/P DCCV 06/2015    Past Surgical History  Procedure Laterality Date  . Lung cancer surgery  2003    resection carcinoid lingula-lt upper lobe  . Colonoscopy    .  Radioactive seed guided mastectomy with axillary sentinel lymph node biopsy Left 06/26/2014    Procedure: RADIOACTIVE SEED GUIDED PARTIAL MASTECTOMY WITH AXILLARY SENTINEL LYMPH NODE BIOPSY;  Surgeon: Stark Klein, MD;  Location: [facility];  Service: General;  Laterality: Left;  . Breast surgery    . Re-excision of breast lumpectomy Left 08/07/2014    Procedure: RE-EXCISION OF LEFT BREAST LUMPECTOMY;  Surgeon: Stark Klein, MD;  Location: [facility];  Service: General;  Laterality: Left;  . Cardioversion N/A 06/24/2015    Procedure: CARDIOVERSION;  Surgeon: Sueanne Margarita, MD;  Location: [facility];  Service: Cardiovascular;  Laterality: N/A;    Current Medications: Outpatient Prescriptions Prior to Visit  Medication Sig Dispense Refill  . albuterol (PROVENTIL HFA;VENTOLIN HFA) 108 (90 Base) MCG/ACT inhaler Inhale 2 puffs into the lungs every 6 (six) hours as needed for wheezing or shortness of breath.    . anastrozole (ARIMIDEX) 1 MG tablet Take 1 tablet (1 mg total) by mouth daily. 90 tablet 3  . apixaban (ELIQUIS) 5 MG TABS tablet Take 1 tablet (5 mg total) by mouth 2 (two) times daily. 180 tablet 3  . ARMOUR THYROID 15 MG tablet TAKE 1 TABLET DAILY 90 tablet 1  . bimatoprost (LUMIGAN) 0.03 %  ophthalmic drops Place 1 drop into both eyes at bedtime.     Marland Kitchen diltiazem (CARDIZEM CD) 180 MG 24 hr capsule Take 1 capsule (180 mg total) by mouth daily. 90 capsule 3  . dorzolamide (TRUSOPT) 2 % ophthalmic solution Place 1 drop into both eyes 2 (two) times daily.    . fluticasone-salmeterol (ADVAIR HFA) 230-21 MCG/ACT inhaler INHALE 2 PUFFS INTO THE LUNGS TWICE A DAY 3 Inhaler 2  . furosemide (LASIX) 20 MG tablet Take 1 tablet (20 mg total) by mouth daily as needed. 30 tablet 6  . gabapentin (NEURONTIN) 400 MG capsule Take 1 capsule (400 mg total) by mouth 2 (two) times daily. 180 capsule 1  . loratadine (CLARITIN) 10 MG tablet Take 10 mg by mouth daily as needed for  allergies.    . metoprolol tartrate (LOPRESSOR) 25 MG tablet Take 1 tablet (25 mg total) by mouth 2 (two) times daily. 180 tablet 3  . Multiple Vitamins-Iron (MULTIVITAMIN/IRON) TABS Take 1 tablet by mouth daily.     Marland Kitchen omeprazole (PRILOSEC) 20 MG capsule TAKE 1 CAPSULE DAILY 30 MINUTES BEFORE A MEAL 90 capsule 1  . traMADol (ULTRAM) 50 MG tablet Take 1 tablet (50 mg total) by mouth every 6 (six) hours as needed. for pain 90 tablet 0  . digoxin (LANOXIN) 0.125 MG tablet Take 1 tablet (0.125 mg total) by mouth daily. 90 tablet 3  . simvastatin (ZOCOR) 80 MG tablet   0   No facility-administered medications prior to visit.     Allergies:   Combigan and Sulfa antibiotics   Social History   Social History  . Marital Status: Married    Spouse Name: N/A  . Number of Children: N/A  . Years of Education: N/A   Social History Main Topics  . Smoking status: Former Smoker    Quit date: 06/15/1963  . Smokeless tobacco: Never Used  . Alcohol Use: 0.0 oz/week    0 Standard drinks or equivalent per week     Comment: occasional wine  . Drug Use: No  . Sexual Activity: Not Asked   Other Topics Concern  . None   Social History Narrative     Family History:  The patient's family history includes Atrial fibrillation in her son; Hyperlipidemia in her father and mother; Hypertension in her father and mother; Stroke in her father and mother; Transient ischemic attack in her father. There is no history of Heart attack.   ROS:   Please see the history of present illness.    ROS All other systems reviewed and are negative.   PHYSICAL EXAM:   VS:  BP 116/73 mmHg  Pulse 64  Ht '4\' 11"'$  (1.499 m)  Wt 140 lb 12.8 oz (63.866 kg)  BMI 28.42 kg/m2   GEN: Well nourished, well developed, in no acute distress HEENT: normal Neck: no JVD, carotid bruits, or masses Cardiac: RRR; no murmurs, rubs, or gallops,no edema.  Intact distal pulses bilaterally.  Respiratory:  clear to auscultation bilaterally,  normal work of breathing GI: soft, nontender, nondistended, + BS MS: no deformity or atrophy Skin: warm and dry, no rash Neuro:  Alert and Oriented x 3, Strength and sensation are intact Psych: euthymic mood, full affect  Wt Readings from Last 3 Encounters:  07/30/15 140 lb 12.8 oz (63.866 kg)  06/20/15 146 lb 4.8 oz (66.361 kg)  06/17/15 147 lb 6.4 oz (66.86 kg)      Studies/Labs Reviewed:   EKG:  EKG is  ordered today.  The ekg ordered today demonstrates atrial fibrillation with CVR at 75bpm.  Nonspecific ST abnormality in the inferior and lateral leads ? Dig effect  Recent Labs: 04/27/2015: B Natriuretic Peptide 404.7*; TSH 2.802 05/01/2015: Magnesium 2.1 06/13/2015: ALT 15 06/17/2015: BUN 22; Creat 1.14*; Hemoglobin 13.5; Platelets 221; Potassium 4.0; Sodium 140   Lipid Panel    Component Value Date/Time   CHOL 167 04/29/2015 0510   TRIG 75 04/29/2015 0510   TRIG 52 01/13/2006 1012   HDL 57 04/29/2015 0510   CHOLHDL 2.9 04/29/2015 0510   CHOLHDL 3.0 CALC 01/13/2006 1012   VLDL 15 04/29/2015 0510   LDLCALC 95 04/29/2015 0510   LDLDIRECT 174.3 06/04/2009 0000   LDLDIRECT 158.9 01/13/2006 1012    Additional studies/ records that were reviewed today include:  none    ASSESSMENT:    1. Paroxysmal atrial fibrillation (HCC)   2. Benign essential HTN   3. Aortic regurgitation   4. Chronic diastolic heart failure ([place])   5. Hyperlipidemia      PLAN:  In order of problems listed above:  1. PAF s/p DCC to NSR and now back in atrial fibrillation rate controlled on CCB and BB.  Continue NOAC. Renal function stable.  Stop digoxin in the setting of normal LVF and starting Amio.  Start Amio '200mg'$  BID.  I will see her in 2 weeks and check an Amio level and if therapeutic will plan DCCV.  I have discussed the potential side effects of Amio with her and she understands.  2. HTN - BP controlled on current medical management. 3. Mild to moderate AR - repeat echo  04/2016 4. Chronic diastolic CHF - appears euvolemic on exam.  Continue BB/CCB and diuretic.   5. Dyslipidemia - stop simvastatin due to Amio and start Lipitor '40mg'$  daily and repeat FLp and ALT in 6 weeks    Medication Adjustments/Labs and Tests Ordered: Current medicines are reviewed at length with the patient today.  Concerns regarding medicines are outlined above.  Medication changes, Labs and Tests ordered today are listed in the Patient Instructions below.  Patient Instructions  Medication Instructions:  1) STOP DIGOXIN 2) START AMIODARONE 200 mg TWICE DAILY  Labwork: None  Testing/Procedures: Your physician has recommended that you have a pulmonary function test. Pulmonary Function Tests are a group of tests that measure how well air moves in and out of your lungs.  Follow-Up: Your physician recommends that you schedule a follow-up appointment on August 14, 2015 at 1:45 PM with Dr. Radford Pax.  Any Other Special Instructions Will Be Listed Below (If Applicable).     If you need a refill on your cardiac medications before your next appointment, please call your pharmacy.       Signed, Fransico Him, MD  07/30/2015 3:44 PM    [facility] [place], [place], [place]  [address] Phone: [phone]; Fax: [phone]

## 2015-07-30 NOTE — Patient Instructions (Addendum)
Medication Instructions:  1) STOP DIGOXIN 2) START AMIODARONE 200 mg TWICE DAILY 3) STOP ZOCOR 4) START LIPITOR 40 mg daily  Labwork: Your physician recommends that you return for FASTING lab work in 6 weeks.   Testing/Procedures: Your physician has recommended that you have a pulmonary function test. Pulmonary Function Tests are a group of tests that measure how well air moves in and out of your lungs.  Follow-Up: Your physician recommends that you schedule a follow-up appointment on August 14, 2015 at 1:45 PM with Dr. Radford Pax.  Any Other Special Instructions Will Be Listed Below (If Applicable).     If you need a refill on your cardiac medications before your next appointment, please call your pharmacy.

## 2015-08-02 NOTE — Addendum Note (Signed)
Addended by: Freada Bergeron on: 08/02/2015 09:06 AM   Modules accepted: Orders

## 2015-08-05 ENCOUNTER — Other Ambulatory Visit: Payer: Self-pay | Admitting: *Deleted

## 2015-08-05 DIAGNOSIS — C50412 Malignant neoplasm of upper-outer quadrant of left female breast: Secondary | ICD-10-CM

## 2015-08-12 DIAGNOSIS — H401122 Primary open-angle glaucoma, left eye, moderate stage: Secondary | ICD-10-CM | POA: Diagnosis not present

## 2015-08-12 DIAGNOSIS — H401111 Primary open-angle glaucoma, right eye, mild stage: Secondary | ICD-10-CM | POA: Diagnosis not present

## 2015-08-14 ENCOUNTER — Other Ambulatory Visit: Payer: Self-pay | Admitting: Family Medicine

## 2015-08-14 ENCOUNTER — Ambulatory Visit (INDEPENDENT_AMBULATORY_CARE_PROVIDER_SITE_OTHER): Payer: Medicare Other | Admitting: Cardiology

## 2015-08-14 ENCOUNTER — Encounter: Payer: Self-pay | Admitting: Cardiology

## 2015-08-14 VITALS — BP 134/74 | HR 80 | Ht 59.0 in | Wt 143.0 lb

## 2015-08-14 DIAGNOSIS — I481 Persistent atrial fibrillation: Secondary | ICD-10-CM

## 2015-08-14 DIAGNOSIS — I639 Cerebral infarction, unspecified: Secondary | ICD-10-CM

## 2015-08-14 DIAGNOSIS — I10 Essential (primary) hypertension: Secondary | ICD-10-CM | POA: Diagnosis not present

## 2015-08-14 DIAGNOSIS — I4891 Unspecified atrial fibrillation: Secondary | ICD-10-CM | POA: Diagnosis not present

## 2015-08-14 DIAGNOSIS — I4819 Other persistent atrial fibrillation: Secondary | ICD-10-CM

## 2015-08-14 DIAGNOSIS — I351 Nonrheumatic aortic (valve) insufficiency: Secondary | ICD-10-CM | POA: Diagnosis not present

## 2015-08-14 DIAGNOSIS — I5032 Chronic diastolic (congestive) heart failure: Secondary | ICD-10-CM | POA: Diagnosis not present

## 2015-08-14 MED ORDER — FUROSEMIDE 20 MG PO TABS: 40.0000 mg | ORAL_TABLET | Freq: Every day | ORAL | Status: DC

## 2015-08-14 NOTE — Patient Instructions (Signed)
Medication Instructions:  1) INCREASE LASIX to 40 mg daily  Labwork: TODAY: BMET, amiodarone level  Testing/Procedures: None  Follow-Up: Your physician recommends that you schedule a follow-up appointment in 10 DAYS with a PA or NP.  Your physician recommends that you schedule a follow-up appointment in 3 MONTHS with Dr. Radford Pax.  Any Other Special Instructions Will Be Listed Below (If Applicable).     If you need a refill on your cardiac medications before your next appointment, please call your pharmacy.

## 2015-08-14 NOTE — Telephone Encounter (Signed)
Medication filled to pharmacy as requested.   

## 2015-08-14 NOTE — Progress Notes (Signed)
Cardiology Office Note    Date:  08/14/2015   ID:  Patricia [name], DOB Nov 13, 1940, MRN [id number]  PCP:  Annye Asa, MD  Cardiologist:  Fransico Him, MD   Chief Complaint  Patient presents with  . Atrial Fibrillation  . Hypertension  . Congestive Heart Failure    History of Present Illness:  Patricia [name] is a 75 y.o. female  with a history of lung CA s/p resection (2003), breast CA s/p lumpectomy/Tamoxifen/XRT, GERD, hypothyroidism, HTN and HLD who presents for follow up. 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 She underwent DCCV to NSR on 06/24/2015. I saw her back in followup and she was back in atrial fibrillation.  She was started on Amio '200mg'$  BID and she is now back for followup.  She denies any chest pain, palpitations, orthopnea, PND. No dizziness or syncope. She says that she thinks she is still in afib because she has been having some DOE which she had int he past with her afib.  She also has had some ankle edema.  She has been taking all of her medicines as prescribed with no missed doses of Eliquis.     Past Medical History  Diagnosis Date  . Cough variant asthma   . Osteopenia   . Stricture and stenosis of cervix   . Postmenopausal   . GERD (gastroesophageal reflux disease)   . Disease of pharynx or nasopharynx   . Benign essential HTN   . Allergic rhinitis, cause unspecified   . Diverticulosis   . Internal hemorrhoids   . Hiatal hernia   . Laryngospasm   . 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 Hyperlipidemia   . Glaucoma   . Laryngospasm   . Multinodular goiter   . Chronic cough   . 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 Radiation 10/22/14-11/23/14    Left Breast  . Chronic diastolic heart failure ([place]) 06/17/2015  . Aortic regurgitation 06/17/2015    Mild to moderate AR by echo 04/2015  . PAF (paroxysmal atrial fibrillation) (HCC)     CHADS2VASC  score is 7 - on Apixaban.  S/P DCCV 06/2015    Past Surgical History  Procedure Laterality Date  . Lung cancer surgery  2003    resection carcinoid lingula-lt upper lobe  . Colonoscopy    . Radioactive seed guided mastectomy with axillary sentinel lymph node biopsy Left 06/26/2014    Procedure: RADIOACTIVE SEED GUIDED PARTIAL MASTECTOMY WITH AXILLARY SENTINEL LYMPH NODE BIOPSY;  Surgeon: Stark Klein, MD;  Location: [place];  Service: General;  Laterality: Left;  . Breast surgery    . Re-excision of breast lumpectomy Left 08/07/2014    Procedure: RE-EXCISION OF LEFT BREAST LUMPECTOMY;  Surgeon: Stark Klein, MD;  Location: [place];  Service: General;  Laterality: Left;  . Cardioversion N/A 06/24/2015    Procedure: CARDIOVERSION;  Surgeon: Sueanne Margarita, MD;  Location: [place] [facility];  Service: Cardiovascular;  Laterality: N/A;    Current Medications: Outpatient Prescriptions Prior to Visit  Medication Sig Dispense Refill  . ADVAIR HFA 230-21 MCG/ACT inhaler USE 2 INHALATIONS TWICE A DAY 36 g 1  . albuterol (PROVENTIL HFA;VENTOLIN HFA) 108 (90 Base) MCG/ACT inhaler Inhale 2 puffs into the lungs every 6 (six) hours as needed for wheezing or shortness of breath.    Marland Kitchen amiodarone (  PACERONE) 200 MG tablet Take 1 tablet (200 mg total) by mouth 2 (two) times daily. 60 tablet 6  . anastrozole (ARIMIDEX) 1 MG tablet Take 1 tablet (1 mg total) by mouth daily. 90 tablet 3  . apixaban (ELIQUIS) 5 MG TABS tablet Take 1 tablet (5 mg total) by mouth 2 (two) times daily. 180 tablet 3  . ARMOUR THYROID 15 MG tablet TAKE 1 TABLET DAILY 90 tablet 1  . atorvastatin (LIPITOR) 40 MG tablet Take 1 tablet (40 mg total) by mouth daily. 30 tablet 6  . bimatoprost (LUMIGAN) 0.03 % ophthalmic drops Place 1 drop into both eyes at bedtime.     Marland Kitchen diltiazem (CARDIZEM CD) 180 MG 24 hr capsule Take 1 capsule (180 mg total) by mouth daily. 90 capsule 3  . dorzolamide (TRUSOPT) 2 % ophthalmic  solution Place 1 drop into both eyes 2 (two) times daily.    Marland Kitchen gabapentin (NEURONTIN) 400 MG capsule TAKE 1 CAPSULE TWICE A DAY 180 capsule 1  . loratadine (CLARITIN) 10 MG tablet Take 10 mg by mouth daily as needed for allergies.    . metoprolol tartrate (LOPRESSOR) 25 MG tablet Take 1 tablet (25 mg total) by mouth 2 (two) times daily. 180 tablet 3  . Multiple Vitamins-Iron (MULTIVITAMIN/IRON) TABS Take 1 tablet by mouth daily.     Marland Kitchen omeprazole (PRILOSEC) 20 MG capsule TAKE 1 CAPSULE DAILY 30 MINUTES BEFORE A MEAL 90 capsule 1  . traMADol (ULTRAM) 50 MG tablet Take 1 tablet (50 mg total) by mouth every 6 (six) hours as needed. for pain 90 tablet 0  . furosemide (LASIX) 20 MG tablet Take 1 tablet (20 mg total) by mouth daily as needed. 30 tablet 6   No facility-administered medications prior to visit.     Allergies:   Combigan and Sulfa antibiotics   Social History   Social History  . Marital Status: Married    Spouse Name: N/A  . Number of Children: N/A  . Years of Education: N/A   Social History Main Topics  . Smoking status: Former Smoker    Quit date: 06/15/1963  . Smokeless tobacco: Never Used  . Alcohol Use: 0.0 oz/week    0 Standard drinks or equivalent per week     Comment: occasional wine  . Drug Use: No  . Sexual Activity: Not Asked   Other Topics Concern  . None   Social History Narrative     Family History:  The patient's family history includes Atrial fibrillation in her son; Hyperlipidemia in her father and mother; Hypertension in her father and mother; Stroke in her father and mother; Transient ischemic attack in her father. There is no history of Heart attack.   ROS:   Please see the history of present illness.    ROS All other systems reviewed and are negative.   PHYSICAL EXAM:   VS:  BP 134/74 mmHg  Pulse 80  Ht '4\' 11"'$  (1.499 m)  Wt 143 lb (64.864 kg)  BMI 28.87 kg/m2   GEN: Well nourished, well developed, in no acute distress HEENT: normal Neck:  no JVD, carotid bruits, or masses Cardiac: irregularly irregular; no murmurs, rubs, or gallops,no edema.  Intact distal pulses bilaterally.  Respiratory:  clear to auscultation bilaterally, normal work of breathing GI: soft, nontender, nondistended, + BS MS: no deformity or atrophy Skin: warm and dry, no rash Neuro:  Alert and Oriented x 3, Strength and sensation are intact Psych: euthymic mood, full affect  Wt Readings  from Last 3 Encounters:  08/14/15 143 lb (64.864 kg)  07/30/15 140 lb 12.8 oz (63.866 kg)  06/20/15 146 lb 4.8 oz (66.361 kg)      Studies/Labs Reviewed:   EKG:  EKG is not ordered today.    Recent Labs: 04/27/2015: B Natriuretic Peptide 404.7*; TSH 2.802 05/01/2015: Magnesium 2.1 06/13/2015: ALT 15 06/17/2015: BUN 22; Creat 1.14*; Hemoglobin 13.5; Platelets 221; Potassium 4.0; Sodium 140   Lipid Panel    Component Value Date/Time   CHOL 167 04/29/2015 0510   TRIG 75 04/29/2015 0510   TRIG 52 01/13/2006 1012   HDL 57 04/29/2015 0510   CHOLHDL 2.9 04/29/2015 0510   CHOLHDL 3.0 CALC 01/13/2006 1012   VLDL 15 04/29/2015 0510   LDLCALC 95 04/29/2015 0510   LDLDIRECT 174.3 06/04/2009 0000   LDLDIRECT 158.9 01/13/2006 1012    Additional studies/ records that were reviewed today include:  none    ASSESSMENT:    1. Persistent atrial fibrillation ([place])   2. Benign essential HTN   3. Aortic regurgitation   4. Chronic diastolic heart failure (HCC)      PLAN:  In order of problems listed above:  1. Persistent atrial fibrillation - she remains in afib despite Amio load for 3 weeks.  I will check an amio level today and if in the therapeutic range, will set up for DCCV.  She has not missed any doses of her Eliquis.  Continue Amkio/Eliquis/BB/CCB. 2. HTN - her BP is controlled today.  Continue BB/CCB. 3. Aortic insufficiency - followed on echo 4. Chronic diastolic CHF - her weight is up 3lbs today along with LE edema and SOB. I suspect her afib is triggering  this. I will increase Lasix to '40mg'$  daily.  Continue BB and CCB.  I will have her see the PA in 10 days to go over Amio level and set up DCCV if Amio level therapeutic.  Medication Adjustments/Labs and Tests Ordered: Current medicines are reviewed at length with the patient today.  Concerns regarding medicines are outlined above.  Medication changes, Labs and Tests ordered today are listed in the Patient Instructions below.  Patient Instructions  Medication Instructions:  1) INCREASE LASIX to 40 mg daily  Labwork: TODAY: BMET, amiodarone level  Testing/Procedures: None  Follow-Up: Your physician recommends that you schedule a follow-up appointment in 10 DAYS with a PA or NP.  Your physician recommends that you schedule a follow-up appointment in 3 MONTHS with Dr. Radford Pax.  Any Other Special Instructions Will Be Listed Below (If Applicable).     If you need a refill on your cardiac medications before your next appointment, please call your pharmacy.       Signed, Fransico Him, MD  08/14/2015 2:16 PM    [facility] [place], [place], [place]  [address] Phone: [phone]; Fax: [phone]

## 2015-08-15 ENCOUNTER — Encounter: Payer: Self-pay | Admitting: Family Medicine

## 2015-08-15 ENCOUNTER — Ambulatory Visit (INDEPENDENT_AMBULATORY_CARE_PROVIDER_SITE_OTHER): Payer: Medicare Other | Admitting: Family Medicine

## 2015-08-15 VITALS — BP 114/72 | HR 62 | Temp 98.1°F | Resp 16 | Ht 59.0 in | Wt 142.5 lb

## 2015-08-15 DIAGNOSIS — I639 Cerebral infarction, unspecified: Secondary | ICD-10-CM | POA: Diagnosis not present

## 2015-08-15 DIAGNOSIS — I481 Persistent atrial fibrillation: Secondary | ICD-10-CM | POA: Diagnosis not present

## 2015-08-15 DIAGNOSIS — E785 Hyperlipidemia, unspecified: Secondary | ICD-10-CM

## 2015-08-15 DIAGNOSIS — I10 Essential (primary) hypertension: Secondary | ICD-10-CM | POA: Diagnosis not present

## 2015-08-15 DIAGNOSIS — I4819 Other persistent atrial fibrillation: Secondary | ICD-10-CM

## 2015-08-15 LAB — BASIC METABOLIC PANEL
BUN: 19 mg/dL (ref 7–25)
CO2: 22 mmol/L (ref 20–31)
CREATININE: 1.14 mg/dL — AB (ref 0.60–0.93)
Calcium: 8.7 mg/dL (ref 8.6–10.4)
Chloride: 107 mmol/L (ref 98–110)
Glucose, Bld: 125 mg/dL — ABNORMAL HIGH (ref 65–99)
POTASSIUM: 3.7 mmol/L (ref 3.5–5.3)
Sodium: 140 mmol/L (ref 135–146)

## 2015-08-15 LAB — HEPATIC FUNCTION PANEL
ALK PHOS: 64 U/L (ref 39–117)
ALT: 30 U/L (ref 0–35)
AST: 18 U/L (ref 0–37)
Albumin: 3.9 g/dL (ref 3.5–5.2)
BILIRUBIN DIRECT: 0.1 mg/dL (ref 0.0–0.3)
TOTAL PROTEIN: 6.2 g/dL (ref 6.0–8.3)
Total Bilirubin: 0.5 mg/dL (ref 0.2–1.2)

## 2015-08-15 LAB — LIPID PANEL
CHOL/HDL RATIO: 3
Cholesterol: 141 mg/dL (ref 0–200)
HDL: 48.4 mg/dL (ref >39.00)
LDL Cholesterol: 69 mg/dL (ref 0–99)
NONHDL: 92.52
TRIGLYCERIDES: 118 mg/dL (ref 0.0–149.0)
VLDL: 23.6 mg/dL (ref 0.0–40.0)

## 2015-08-15 LAB — CBC WITH DIFFERENTIAL/PLATELET
Basophils Absolute: 0 10*3/uL (ref 0.0–0.1)
Basophils Relative: 0.1 % (ref 0.0–3.0)
EOS PCT: 0.1 % (ref 0.0–5.0)
Eosinophils Absolute: 0 10*3/uL (ref 0.0–0.7)
HCT: 42.3 % (ref 36.0–46.0)
Hemoglobin: 14.2 g/dL (ref 12.0–15.0)
LYMPHS ABS: 1.3 10*3/uL (ref 0.7–4.0)
Lymphocytes Relative: 16.7 % (ref 12.0–46.0)
MCHC: 33.6 g/dL (ref 30.0–36.0)
MCV: 94.5 fl (ref 78.0–100.0)
MONO ABS: 0.6 10*3/uL (ref 0.1–1.0)
MONOS PCT: 6.8 % (ref 3.0–12.0)
NEUTROS ABS: 6.2 10*3/uL (ref 1.4–7.7)
NEUTROS PCT: 76.3 % (ref 43.0–77.0)
PLATELETS: 256 10*3/uL (ref 150.0–400.0)
RBC: 4.48 Mil/uL (ref 3.87–5.11)
RDW: 13.1 % (ref 11.5–15.5)
WBC: 8.1 10*3/uL (ref 4.0–10.5)

## 2015-08-15 NOTE — Patient Instructions (Signed)
Schedule your complete physical in 6 months We'll notify you of your lab results and make any changes if needed Keep up the good work on healthy diet and regular exercise- you look great! Call with any questions or concerns Thanks for sticking with [name]!!! Have a great summer!!!

## 2015-08-15 NOTE — Progress Notes (Signed)
   Subjective:    Patient ID: Patricia [name], female    DOB: 04-24-1940, 75 y.o.   MRN: [id number]  HPI HTN- chronic problem, on Dilt, Lasix, Metoprolol w/ good BP control.  Denies CP, SOB, HAs, visual changes.  Some ongoing foot/ankle edema- lasix increased yesterday by Dr Radford Pax  Hyperlipidemia- chronic problem, on Lipitor.  Still walking and playing golf.  Denies abd pain, N/V, myalgias.  Afib- ongoing issue, following w/ Dr Radford Pax.  Not able to get heart back in rhythm despite cardioversion and Amiodarone.  Plan is to increase amiodarone and Lasix.  Pt reports fatigue.  Intermittent palpitations.     Review of Systems For ROS see HPI     Objective:   Physical Exam  Constitutional: She is oriented to person, place, and time. She appears well-developed and well-nourished. No distress.  HENT:  Head: Normocephalic and atraumatic.  Eyes: Conjunctivae and EOM are normal. Pupils are equal, round, and reactive to light.  Neck: Normal range of motion. Neck supple. No thyromegaly present.  Cardiovascular: Normal rate, normal heart sounds and intact distal pulses.   No murmur heard. Irregularly irregular  Pulmonary/Chest: Effort normal and breath sounds normal. No respiratory distress.  Abdominal: Soft. She exhibits no distension. There is no tenderness.  Musculoskeletal: She exhibits edema (1-2+ swelling of feet bilaterally).  Lymphadenopathy:    She has no cervical adenopathy.  Neurological: She is alert and oriented to person, place, and time.  Skin: Skin is warm and dry.  Psychiatric: She has a normal mood and affect. Her behavior is normal.  Vitals reviewed.         Assessment & Plan:

## 2015-08-15 NOTE — Assessment & Plan Note (Signed)
Chronic problem.  Well controlled on current medications.  Reviewed BMP done yesterday.  No med changes at this time.  Will follow closely.

## 2015-08-15 NOTE — Assessment & Plan Note (Signed)
Chronic problem.  Tolerating statin w/o difficulty.  Check labs.  Adjust meds prn  

## 2015-08-15 NOTE — Assessment & Plan Note (Signed)
Ongoing issue.  Pt saw Dr Radford Pax yesterday.  Still in Afib, symptomatic w/ swelling and fatigue.  Plan is to increase Lasix and Amiodarone.  Will continue to follow along and assist as able.

## 2015-08-15 NOTE — Progress Notes (Signed)
Pre visit review using our clinic review tool, if applicable. No additional management support is needed unless otherwise documented below in the visit note. 

## 2015-08-20 LAB — AMIODARONE LEVEL
AMIODARONE LVL: 0.8 ug/mL — AB (ref 1.5–2.5)
DESETHYLAMIODARONE: 0.4 ug/mL — AB (ref 1.5–2.5)

## 2015-08-23 ENCOUNTER — Ambulatory Visit (INDEPENDENT_AMBULATORY_CARE_PROVIDER_SITE_OTHER): Payer: Medicare Other | Admitting: Physician Assistant

## 2015-08-23 ENCOUNTER — Encounter: Payer: Self-pay | Admitting: Physician Assistant

## 2015-08-23 VITALS — BP 120/80 | HR 80 | Ht 59.0 in | Wt 140.0 lb

## 2015-08-23 DIAGNOSIS — I481 Persistent atrial fibrillation: Secondary | ICD-10-CM

## 2015-08-23 DIAGNOSIS — I351 Nonrheumatic aortic (valve) insufficiency: Secondary | ICD-10-CM

## 2015-08-23 DIAGNOSIS — I5032 Chronic diastolic (congestive) heart failure: Secondary | ICD-10-CM

## 2015-08-23 DIAGNOSIS — I10 Essential (primary) hypertension: Secondary | ICD-10-CM

## 2015-08-23 DIAGNOSIS — I639 Cerebral infarction, unspecified: Secondary | ICD-10-CM

## 2015-08-23 DIAGNOSIS — I4819 Other persistent atrial fibrillation: Secondary | ICD-10-CM

## 2015-08-23 LAB — BASIC METABOLIC PANEL
BUN: 26 mg/dL — ABNORMAL HIGH (ref 7–25)
CHLORIDE: 106 mmol/L (ref 98–110)
CO2: 25 mmol/L (ref 20–31)
CREATININE: 1.49 mg/dL — AB (ref 0.60–0.93)
Calcium: 8.5 mg/dL — ABNORMAL LOW (ref 8.6–10.4)
Glucose, Bld: 119 mg/dL — ABNORMAL HIGH (ref 65–99)
POTASSIUM: 4.3 mmol/L (ref 3.5–5.3)
SODIUM: 140 mmol/L (ref 135–146)

## 2015-08-23 NOTE — Addendum Note (Signed)
Addended by: Eulis Foster on: 08/23/2015 12:50 PM   Modules accepted: Orders

## 2015-08-23 NOTE — Progress Notes (Signed)
Cardiology Office Note    Date:  08/23/2015   ID:  Tyne Banta, DOB 1940/04/14, MRN [id number]  PCP:  Annye Asa, MD  Cardiologist:  Dr. Radford Pax  Chief Complaint: Atrial fibrillation  History of Present Illness:   Patricia [name] is a 75 y.o. 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   She presented [facility]15 with chest pain, SOB, orthopnea as well as some confusion and word finding difficulties. 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 CXR with CHF and BNP elevated. She was given a short course of IV lasix with improvement and started on IV heparin gtt. She underwent nuclear stress testing on 04/28/14 which was low risk for ischemia; EF 60%. 2D ECHO showed normal LV function, no RWMA, high ventricular filling pressures, mild-mod AR, severe LA dilation, mild TR. She was eventually transitioned to Eliquis '5mg'$  BID   She underwent DCCV to NSR on 06/24/2015. She was back in atrial fibrillation during follow visit 07/30/15. At that time, she was started on Amio '200mg'$  BID. She remained  in afib despite Amio load for 3 weeks during office visit of 08/14/15. Amio level was low on 08/14/15 and her amiodarone increased to '400mg'$  BID for 1 week then '300mg'$  daily and repeat amio load in 2 weeks. LFT were normal. Increased lasix to '40mg'$  daily during last OV.   She presents today for followup.She states that she never increased her amiodarone. Taking lasix '40mg'$  qd with resolution  of LE edema.  She denies any chest pain, palpitations, orthopnea, PND. No dizziness or syncope. Still having DOE however improved sightly. She has been taking all of her other medicines as prescribed with no missed doses of  Eliquis.    Past Medical History  Diagnosis Date  . Cough variant asthma   . Osteopenia   . Stricture and stenosis of cervix   . Postmenopausal   . GERD (gastroesophageal reflux disease)   . Disease of pharynx or nasopharynx   . Benign essential HTN   . Allergic rhinitis, cause unspecified   . Diverticulosis   . Internal hemorrhoids   . Hiatal hernia   . Laryngospasm   . 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 Hyperlipidemia   . Glaucoma   . Laryngospasm   . Multinodular goiter   . Chronic cough   . 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 Radiation 10/22/14-11/23/14    Left Breast  . Chronic diastolic heart failure ([place]) 06/17/2015  . Aortic regurgitation 06/17/2015    Mild to moderate AR by echo 04/2015  . PAF (paroxysmal atrial fibrillation) (HCC)     CHADS2VASC score is 7 - on Apixaban.  S/P DCCV 06/2015    Past Surgical History  Procedure Laterality Date  . Lung cancer surgery  2003    resection carcinoid lingula-lt upper lobe  . Colonoscopy    . Radioactive seed guided mastectomy with axillary sentinel lymph node biopsy Left 06/26/2014    Procedure: RADIOACTIVE SEED GUIDED PARTIAL MASTECTOMY WITH AXILLARY SENTINEL LYMPH NODE BIOPSY;  Surgeon: Stark Klein, MD;  Location: [place];  Service: General;  Laterality: Left;  . Breast surgery    .  Re-excision of breast lumpectomy Left 08/07/2014    Procedure: RE-EXCISION OF LEFT BREAST LUMPECTOMY;  Surgeon: Stark Klein, MD;  Location: [place];  Service: General;  Laterality: Left;  . Cardioversion N/A 06/24/2015    Procedure: CARDIOVERSION;  Surgeon: Sueanne Margarita, MD;  Location: [place] [facility];  Service: Cardiovascular;  Laterality: N/A;    Current Medications: Prior to Admission medications   Medication Sig Start Date End Date Taking? Authorizing Provider  ADVAIR HFA 230-21 MCG/ACT inhaler USE 2 INHALATIONS TWICE A DAY 08/14/15   Midge Minium, MD  albuterol (PROVENTIL  HFA;VENTOLIN HFA) 108 (90 Base) MCG/ACT inhaler Inhale 2 puffs into the lungs every 6 (six) hours as needed for wheezing or shortness of breath.    Historical Provider, MD  amiodarone (PACERONE) 200 MG tablet Take 1 tablet (200 mg total) by mouth 2 (two) times daily. 07/30/15   Sueanne Margarita, MD  anastrozole (ARIMIDEX) 1 MG tablet Take 1 tablet (1 mg total) by mouth daily. 06/20/15   Laurie Panda, NP  apixaban (ELIQUIS) 5 MG TABS tablet Take 1 tablet (5 mg total) by mouth 2 (two) times daily. 05/08/15   Eileen Stanford, PA-C  ARMOUR THYROID 15 MG tablet TAKE 1 TABLET DAILY 06/03/15   Midge Minium, MD  atorvastatin (LIPITOR) 40 MG tablet Take 1 tablet (40 mg total) by mouth daily. 07/30/15   Sueanne Margarita, MD  bimatoprost (LUMIGAN) 0.03 % ophthalmic drops Place 1 drop into both eyes at bedtime.     Historical Provider, MD  diltiazem (CARDIZEM CD) 180 MG 24 hr capsule Take 1 capsule (180 mg total) by mouth daily. 05/08/15   Eileen Stanford, PA-C  dorzolamide (TRUSOPT) 2 % ophthalmic solution Place 1 drop into both eyes 2 (two) times daily.    Historical Provider, MD  furosemide (LASIX) 20 MG tablet Take 2 tablets (40 mg total) by mouth daily. 08/14/15   Sueanne Margarita, MD  gabapentin (NEURONTIN) 400 MG capsule TAKE 1 CAPSULE TWICE A DAY 08/14/15   Midge Minium, MD  loratadine (CLARITIN) 10 MG tablet Take 10 mg by mouth daily as needed for allergies.    Historical Provider, MD  metoprolol tartrate (LOPRESSOR) 25 MG tablet Take 1 tablet (25 mg total) by mouth 2 (two) times daily. 05/08/15   Eileen Stanford, PA-C  Multiple Vitamins-Iron (MULTIVITAMIN/IRON) TABS Take 1 tablet by mouth daily.     Historical Provider, MD  omeprazole (PRILOSEC) 20 MG capsule TAKE 1 CAPSULE DAILY 30 MINUTES BEFORE A MEAL 06/10/15   Midge Minium, MD  traMADol (ULTRAM) 50 MG tablet Take 1 tablet (50 mg total) by mouth every 6 (six) hours as needed. for pain 06/19/15   Midge Minium, MD    Allergies:    Combigan and Sulfa antibiotics   Social History   Social History  . Marital Status: Married    Spouse Name: N/A  . Number of Children: N/A  . Years of Education: N/A   Social History Main Topics  . Smoking status: Former Smoker    Quit date: 06/15/1963  . Smokeless tobacco: Never Used  . Alcohol Use: 0.0 oz/week    0 Standard drinks or equivalent per week     Comment: occasional wine  . Drug Use: No  . Sexual Activity: Not on file   Other Topics Concern  . Not on file   Social History Narrative     Family History:  The patient's family history includes  Atrial fibrillation in her son; Hyperlipidemia in her father and mother; Hypertension in her father and mother; Stroke in her father and mother; Transient ischemic attack in her father. There is no history of Heart attack.   ROS:   Please see the history of present illness.    ROS All other systems reviewed and are negative.   PHYSICAL EXAM:   VS:  There were no vitals taken for this visit.   GEN: Well nourished, well developed, in no acute distress HEENT: normal Neck: no JVD, carotid bruits, or masses Cardiac: Ir Ir RRR; no murmurs, rubs, or gallops,no edema  Respiratory:  clear to auscultation bilaterally, normal work of breathing GI: soft, nontender, nondistended, + BS MS: no deformity or atrophy Skin: warm and dry, no rash Neuro:  Alert and Oriented x 3, Strength and sensation are intact Psych: euthymic mood, full affect  Wt Readings from Last 3 Encounters:  08/15/15 142 lb 8 oz (64.638 kg)  08/14/15 143 lb (64.864 kg)  07/30/15 140 lb 12.8 oz (63.866 kg)      Studies/Labs Reviewed:   EKG:  EKG is ordered today.  The ekg ordered today demonstrates Afib at rate of 80 bpm. Non specific ST/ T changes. No acute changes.   Recent Labs: 04/27/2015: B Natriuretic Peptide 404.7*; TSH 2.802 05/01/2015: Magnesium 2.1 08/14/2015: BUN 19; Creat 1.14*; Potassium 3.7; Sodium 140 08/15/2015: ALT 30; Hemoglobin 14.2;  Platelets 256.0   Lipid Panel    Component Value Date/Time   CHOL 141 08/15/2015 1118   TRIG 118.0 08/15/2015 1118   TRIG 52 01/13/2006 1012   HDL 48.40 08/15/2015 1118   CHOLHDL 3 08/15/2015 1118   CHOLHDL 3.0 CALC 01/13/2006 1012   VLDL 23.6 08/15/2015 1118   LDLCALC 69 08/15/2015 1118   LDLDIRECT 174.3 06/04/2009 0000   LDLDIRECT 158.9 01/13/2006 1012    Additional studies/ records that were reviewed today include:   Echocardiogram: 04/28/15 LV EF: 55% - 60%  ------------------------------------------------------------------- Indications: Chest pain 786.51.  ------------------------------------------------------------------- History: Risk factors: Paroxysmal A-Fib. Asthma. Pulmonary edema. Hypertension.  ------------------------------------------------------------------- Study Conclusions  - Left ventricle: The cavity size was normal. Systolic function was  normal. The estimated ejection fraction was in the range of 55%  to 60%. Wall motion was normal; there were no regional wall  motion abnormalities. The study is not technically sufficient to  allow evaluation of LV diastolic function. Doppler parameters are  consistent with high ventricular filling pressure. - Aortic valve: Transvalvular velocity was within the normal range.  There was no stenosis. There was mild to moderate regurgitation.  Regurgitation pressure half-time: 369 ms. - Mitral valve: Transvalvular velocity was within the normal range.  There was no evidence for stenosis. There was trivial  regurgitation. - Left atrium: The atrium was severely dilated. - Right ventricle: The cavity size was normal. Wall thickness was  normal. Systolic function was normal. - Atrial septum: No defect or patent foramen ovale was identified  by color flow Doppler. - Tricuspid valve: There was mild regurgitation. - Inferior vena cava: The vessel was normal in size. The  respirophasic diameter  changes were in the normal range (>= 50%),  consistent with normal central venous pressure.    ASSESSMENT & PLAN:   1. Persistent atrial fibrillation  - she remains in afib at controlled rate. She never increased her amiodarone as advised due to miss communications. Will go back to original plan to increase amiodarone to '400mg'$  BID for 1 week then '300mg'$  daily. Recheck in 2  weeks and we will repeat amio level at that time and possible set up for DCCV if therapeutic range of amio. Continue Amio/Eliquis/BB/CCB.  2. Chronic diastolic CHF  -Resolved LE Edema. Continue lasix '40mg'$  qd. Will get BMET today. Euvolemic on exam. Heart failure education given.   3. HTN  - BP is stable and well controlled. Continue BB/CCB.  4. Aortic insufficiency  - Followed on echo    Medication Adjustments/Labs and Tests Ordered: Current medicines are reviewed at length with the patient today.  Concerns regarding medicines are outlined above.  Medication changes, Labs and Tests ordered today are listed in the Patient Instructions below. There are no Patient Instructions on file for this visit.   Jarrett Soho, [place]  08/23/2015 9:13 AM    [facility] [place], [place], [place]  [address] Phone: [phone]; Fax: [phone]

## 2015-08-23 NOTE — Addendum Note (Signed)
Addended by: Eulis Foster on: 08/23/2015 12:02 PM   Modules accepted: Orders

## 2015-08-23 NOTE — Patient Instructions (Addendum)
Medication Instructions:   TAKE AMIODARONE 400 MG TWICE A DAY FOR 1  WEEK   THEN START TAKING AMIODARONE 300 MG ONCE A DAY   If you need a refill on your cardiac medications before your next appointment, please call your pharmacy.  Labwork: BNP    Testing/Procedures: NONE ORDER TODAY    Follow-Up: ON July 10 WITH BHAGAT    Any Other Special Instructions Will Be Listed Below (If Applicable).

## 2015-08-25 ENCOUNTER — Encounter (HOSPITAL_COMMUNITY): Payer: Self-pay | Admitting: *Deleted

## 2015-08-25 ENCOUNTER — Emergency Department (HOSPITAL_COMMUNITY): Payer: Medicare Other

## 2015-08-25 ENCOUNTER — Emergency Department (HOSPITAL_COMMUNITY)
Admission: EM | Admit: 2015-08-25 | Discharge: 2015-08-25 | Disposition: A | Discharge status: Home / Self Care | Payer: Medicare Other | Attending: Emergency Medicine | Admitting: Emergency Medicine

## 2015-08-25 DIAGNOSIS — Z7901 Long term (current) use of anticoagulants: Secondary | ICD-10-CM | POA: Insufficient documentation

## 2015-08-25 DIAGNOSIS — I110 Hypertensive heart disease with heart failure: Secondary | ICD-10-CM | POA: Diagnosis not present

## 2015-08-25 DIAGNOSIS — N289 Disorder of kidney and ureter, unspecified: Secondary | ICD-10-CM | POA: Diagnosis not present

## 2015-08-25 DIAGNOSIS — Z87891 Personal history of nicotine dependence: Secondary | ICD-10-CM | POA: Diagnosis not present

## 2015-08-25 DIAGNOSIS — J45991 Cough variant asthma: Secondary | ICD-10-CM | POA: Insufficient documentation

## 2015-08-25 DIAGNOSIS — I5033 Acute on chronic diastolic (congestive) heart failure: Secondary | ICD-10-CM | POA: Diagnosis not present

## 2015-08-25 DIAGNOSIS — R0602 Shortness of breath: Secondary | ICD-10-CM | POA: Diagnosis not present

## 2015-08-25 DIAGNOSIS — I4891 Unspecified atrial fibrillation: Secondary | ICD-10-CM | POA: Diagnosis present

## 2015-08-25 DIAGNOSIS — E785 Hyperlipidemia, unspecified: Secondary | ICD-10-CM | POA: Diagnosis not present

## 2015-08-25 DIAGNOSIS — R05 Cough: Secondary | ICD-10-CM | POA: Diagnosis not present

## 2015-08-25 DIAGNOSIS — Z85110 Personal history of malignant carcinoid tumor of bronchus and lung: Secondary | ICD-10-CM | POA: Insufficient documentation

## 2015-08-25 LAB — BASIC METABOLIC PANEL
Anion gap: 6 (ref 5–15)
BUN: 27 mg/dL — AB (ref 6–20)
CHLORIDE: 105 mmol/L (ref 101–111)
CO2: 24 mmol/L (ref 22–32)
CREATININE: 1.34 mg/dL — AB (ref 0.44–1.00)
Calcium: 8.8 mg/dL — ABNORMAL LOW (ref 8.9–10.3)
GFR calc Af Amer: 44 mL/min — ABNORMAL LOW (ref >60)
GFR calc non Af Amer: 38 mL/min — ABNORMAL LOW (ref >60)
GLUCOSE: 139 mg/dL — AB (ref 65–99)
POTASSIUM: 4.2 mmol/L (ref 3.5–5.1)
Sodium: 135 mmol/L (ref 135–145)

## 2015-08-25 LAB — CBC
HEMATOCRIT: 42 % (ref 36.0–46.0)
Hemoglobin: 13.5 g/dL (ref 12.0–15.0)
MCH: 30.5 pg (ref 26.0–34.0)
MCHC: 32.1 g/dL (ref 30.0–36.0)
MCV: 94.8 fL (ref 78.0–100.0)
PLATELETS: 238 10*3/uL (ref 150–400)
RBC: 4.43 MIL/uL (ref 3.87–5.11)
RDW: 13 % (ref 11.5–15.5)
WBC: 8.7 10*3/uL (ref 4.0–10.5)

## 2015-08-25 LAB — I-STAT TROPONIN, ED: Troponin i, poc: 0 ng/mL (ref 0.00–0.08)

## 2015-08-25 LAB — BRAIN NATRIURETIC PEPTIDE: B Natriuretic Peptide: 484.1 pg/mL — ABNORMAL HIGH (ref 0.0–100.0)

## 2015-08-25 MED ORDER — FUROSEMIDE 10 MG/ML IJ SOLN
40.0000 mg | Freq: Once | INTRAMUSCULAR | Status: AC
  Administered 2015-08-25: 40 mg via INTRAVENOUS
  Filled 2015-08-25: qty 4

## 2015-08-25 NOTE — ED Notes (Signed)
Pt c/o shortness of breath and afib. Pt has hx of afib and on Eliquis. Pt some chest pressure and some mild dizziness when walking today.

## 2015-08-25 NOTE — Discharge Instructions (Signed)
Increase your furosemide (Lasix) to 80 mg (4 tablets) once a day for the next 3 days. After that, he resumed taking 40 mg (2 tablets) once a day.  Heart Failure Heart failure is a condition in which the heart has trouble pumping blood. This means your heart does not pump blood efficiently for your body to work well. In some cases of heart failure, fluid may back up into your lungs or you may have swelling (edema) in your lower legs. Heart failure is usually a long-term (chronic) condition. It is important for you to take good care of yourself and follow your health care provider's treatment plan. CAUSES  Some health conditions can cause heart failure. Those health conditions include:  High blood pressure (hypertension). Hypertension causes the heart muscle to work harder than normal. When pressure in the blood vessels is high, the heart needs to pump (contract) with more force in order to circulate blood throughout the body. High blood pressure eventually causes the heart to become stiff and weak.  Coronary artery disease (CAD). CAD is the buildup of cholesterol and fat (plaque) in the arteries of the heart. The blockage in the arteries deprives the heart muscle of oxygen and blood. This can cause chest pain and may lead to a heart attack. High blood pressure can also contribute to CAD.  Heart attack (myocardial infarction). A heart attack occurs when one or more arteries in the heart become blocked. The loss of oxygen damages the muscle tissue of the heart. When this happens, part of the heart muscle dies. The injured tissue does not contract as well and weakens the heart's ability to pump blood.  Abnormal heart valves. When the heart valves do not open and close properly, it can cause heart failure. This makes the heart muscle pump harder to keep the blood flowing.  Heart muscle disease (cardiomyopathy or myocarditis). Heart muscle disease is damage to the heart muscle from a variety of causes.  These can include drug or alcohol abuse, infections, or unknown reasons. These can increase the risk of heart failure.  Lung disease. Lung disease makes the heart work harder because the lungs do not work properly. This can cause a strain on the heart, leading it to fail.  Diabetes. Diabetes increases the risk of heart failure. High blood sugar contributes to high fat (lipid) levels in the blood. Diabetes can also cause slow damage to tiny blood vessels that carry important nutrients to the heart muscle. When the heart does not get enough oxygen and food, it can cause the heart to become weak and stiff. This leads to a heart that does not contract efficiently.  Other conditions can contribute to heart failure. These include abnormal heart rhythms, thyroid problems, and low blood counts (anemia). Certain unhealthy behaviors can increase the risk of heart failure, including:  Being overweight.  Smoking or chewing tobacco.  Eating foods high in fat and cholesterol.  Abusing illicit drugs or alcohol.  Lacking physical activity. SYMPTOMS  Heart failure symptoms may vary and can be hard to detect. Symptoms may include:  Shortness of breath with activity, such as climbing stairs.  Persistent cough.  Swelling of the feet, ankles, legs, or abdomen.  Unexplained weight gain.  Difficulty breathing when lying flat (orthopnea).  Waking from sleep because of the need to sit up and get more air.  Rapid heartbeat.  Fatigue and loss of energy.  Feeling light-headed, dizzy, or close to fainting.  Loss of appetite.  Nausea.  Increased urination  during the night (nocturia). DIAGNOSIS  A diagnosis of heart failure is based on your history, symptoms, physical examination, and diagnostic tests. Diagnostic tests for heart failure may include:  Echocardiography.  Electrocardiography.  Chest X-ray.  Blood tests.  Exercise stress test.  Cardiac angiography.  Radionuclide  scans. TREATMENT  Treatment is aimed at managing the symptoms of heart failure. Medicines, behavioral changes, or surgical intervention may be necessary to treat heart failure.  Medicines to help treat heart failure may include:  Angiotensin-converting enzyme (ACE) inhibitors. This type of medicine blocks the effects of a blood protein called angiotensin-converting enzyme. ACE inhibitors relax (dilate) the blood vessels and help lower blood pressure.  Angiotensin receptor blockers (ARBs). This type of medicine blocks the actions of a blood protein called angiotensin. Angiotensin receptor blockers dilate the blood vessels and help lower blood pressure.  Water pills (diuretics). Diuretics cause the kidneys to remove salt and water from the blood. The extra fluid is removed through urination. This loss of extra fluid lowers the volume of blood the heart pumps.  Beta blockers. These prevent the heart from beating too fast and improve heart muscle strength.  Digitalis. This increases the force of the heartbeat.  Healthy behavior changes include:  Obtaining and maintaining a healthy weight.  Stopping smoking or chewing tobacco.  Eating heart-healthy foods.  Limiting or avoiding alcohol.  Stopping illicit drug use.  Physical activity as directed by your health care provider.  Surgical treatment for heart failure may include:  A procedure to open blocked arteries, repair damaged heart valves, or remove damaged heart muscle tissue.  A pacemaker to improve heart muscle function and control certain abnormal heart rhythms.  An internal cardioverter defibrillator to treat certain serious abnormal heart rhythms.  A left ventricular assist device (LVAD) to assist the pumping ability of the heart. HOME CARE INSTRUCTIONS   Take medicines only as directed by your health care provider. Medicines are important in reducing the workload of your heart, slowing the progression of heart failure, and  improving your symptoms.  Do not stop taking your medicine unless directed by your health care provider.  Do not skip any dose of medicine.  Refill your prescriptions before you run out of medicine. Your medicines are needed every day.  Engage in moderate physical activity if directed by your health care provider. Moderate physical activity can benefit some people. The elderly and people with severe heart failure should consult with a health care provider for physical activity recommendations.  Eat heart-healthy foods. Food choices should be free of trans fat and low in saturated fat, cholesterol, and salt (sodium). Healthy choices include fresh or frozen fruits and vegetables, fish, lean meats, legumes, fat-free or low-fat dairy products, and whole grain or high fiber foods. Talk to a dietitian to learn more about heart-healthy foods.  Limit sodium if directed by your health care provider. Sodium restriction may reduce symptoms of heart failure in some people. Talk to a dietitian to learn more about heart-healthy seasonings.  Use healthy cooking methods. Healthy cooking methods include roasting, grilling, broiling, baking, poaching, steaming, or stir-frying. Talk to a dietitian to learn more about healthy cooking methods.  Limit fluids if directed by your health care provider. Fluid restriction may reduce symptoms of heart failure in some people.  Weigh yourself every day. Daily weights are important in the early recognition of excess fluid. You should weigh yourself every morning after you urinate and before you eat breakfast. Wear the same amount of clothing each  time you weigh yourself. Record your daily weight. Provide your health care provider with your weight record.  Monitor and record your blood pressure if directed by your health care provider.  Check your pulse if directed by your health care provider.  Lose weight if directed by your health care provider. Weight loss may reduce  symptoms of heart failure in some people.  Stop smoking or chewing tobacco. Nicotine makes your heart work harder by causing your blood vessels to constrict. Do not use nicotine gum or patches before talking to your health care provider.  Keep all follow-up visits as directed by your health care provider. This is important.  Limit alcohol intake to no more than 1 drink per day for nonpregnant women and 2 drinks per day for men. One drink equals 12 ounces of beer, 5 ounces of wine, or 1 ounces of hard liquor. Drinking more than that is harmful to your heart. Tell your health care provider if you drink alcohol several times a week. Talk with your health care provider about whether alcohol is safe for you. If your heart has already been damaged by alcohol or you have severe heart failure, drinking alcohol should be stopped completely.  Stop illicit drug use.  Stay up-to-date with immunizations. It is especially important to prevent respiratory infections through current pneumococcal and influenza immunizations.  Manage other health conditions such as hypertension, diabetes, thyroid disease, or abnormal heart rhythms as directed by your health care provider.  Learn to manage stress.  Plan rest periods when fatigued.  Learn strategies to manage high temperatures. If the weather is extremely hot:  Avoid vigorous physical activity.  Use air conditioning or fans or seek a cooler location.  Avoid caffeine and alcohol.  Wear loose-fitting, lightweight, and light-colored clothing.  Learn strategies to manage cold temperatures. If the weather is extremely cold:  Avoid vigorous physical activity.  Layer clothes.  Wear mittens or gloves, a hat, and a scarf when going outside.  Avoid alcohol.  Obtain ongoing education and support as needed.  Participate in or seek rehabilitation as needed to maintain or improve independence and quality of life. SEEK MEDICAL CARE IF:   You have a rapid  weight gain.  You have increasing shortness of breath that is unusual for you.  You are unable to participate in your usual physical activities.  You tire easily.  You cough more than normal, especially with physical activity.  You have any or more swelling in areas such as your hands, feet, ankles, or abdomen.  You are unable to sleep because it is hard to breathe.  You feel like your heart is beating fast (palpitations).  You become dizzy or light-headed upon standing up. SEEK IMMEDIATE MEDICAL CARE IF:   You have difficulty breathing.  There is a change in mental status such as decreased alertness or difficulty with concentration.  You have a pain or discomfort in your chest.  You have an episode of fainting (syncope). MAKE SURE YOU:   Understand these instructions.  Will watch your condition.  Will get help right away if you are not doing well or get worse.   This information is not intended to replace advice given to you by your health care provider. Make sure you discuss any questions you have with your health care provider.   Document Released: 02/16/2005 Document Revised: 07/03/2014 Document Reviewed: 03/18/2012 Elsevier Interactive Patient Education [employer].

## 2015-08-25 NOTE — ED Notes (Signed)
Noted to be visibly dyspneic at rest with O2 sat 90-92%.  O2 placed at 2L for comfort at this time.

## 2015-08-25 NOTE — ED Provider Notes (Signed)
CSN: [id number]     Arrival date & time 08/25/15  0054 History  By signing my name below, I, Reola Mosher, attest that this documentation has been prepared under the direction and in the presence of Delora Fuel, MD.  Electronically Signed: Reola Mosher, ED Scribe. 08/25/2015. 2:02 AM.      Chief Complaint  Patient presents with  . Atrial Fibrillation  . Shortness of Breath   The history is provided by the patient and the spouse. No language interpreter was used.   HPI Comments: Patricia [name] is a 75 y.o. female with a PMHx significant for paroxysmal Afib, cough variant asthma, GERD, HLD, and CHF who presents to the Emergency Department complaining of gradual onset, gradually worsening, constant, 6/10, heavy center chest pain onset two days ago. Pt reports that she has a hx of Afib and states that her pain today feels similar to her previous episodes. Pt has had associated mild dizziness and SOB. She reports that her dizziness and chest pain are both worsened with ambulation or positional changes. Pt notes that her last episode of Afib was 2 weeks ago, and Dr. Radford Pax (Cardiology) performed cardioversion on her and sent her home with a prescription for Amiodarone. Pt also is currently taking '5mg'$  Eliquis and '20mg'$  Lasix for her hx of Afib, and has been compliant with all of her medications. Pt denies nausea or diaphoresis.   Past Medical History  Diagnosis Date  . Cough variant asthma   . Osteopenia   . Stricture and stenosis of cervix   . Postmenopausal   . GERD (gastroesophageal reflux disease)   . Disease of pharynx or nasopharynx   . Benign essential HTN   . Allergic rhinitis, cause unspecified   . Diverticulosis   . Internal hemorrhoids   . Hiatal hernia   . Laryngospasm   . 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 Hyperlipidemia   . Glaucoma   . Laryngospasm   . Multinodular goiter   . Chronic cough   . 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 Radiation  10/22/14-11/23/14    Left Breast  . Chronic diastolic heart failure ([place]) 06/17/2015  . Aortic regurgitation 06/17/2015    Mild to moderate AR by echo 04/2015  . PAF (paroxysmal atrial fibrillation) (HCC)     CHADS2VASC score is 7 - on Apixaban.  S/P DCCV 06/2015   Past Surgical History  Procedure Laterality Date  . Lung cancer surgery  2003    resection carcinoid lingula-lt upper lobe  . Colonoscopy    . Radioactive seed guided mastectomy with axillary sentinel lymph node biopsy Left 06/26/2014    Procedure: RADIOACTIVE SEED GUIDED PARTIAL MASTECTOMY WITH AXILLARY SENTINEL LYMPH NODE BIOPSY;  Surgeon: Stark Klein, MD;  Location: [place];  Service: General;  Laterality: Left;  . Breast surgery    . Re-excision of breast lumpectomy Left 08/07/2014    Procedure: RE-EXCISION OF LEFT BREAST LUMPECTOMY;  Surgeon: Stark Klein, MD;  Location: [place];  Service: General;  Laterality: Left;  . Cardioversion N/A 06/24/2015    Procedure: CARDIOVERSION;  Surgeon: Sueanne Margarita, MD;  Location: [facility];  Service: Cardiovascular;  Laterality: N/A;   Family History  Problem Relation Age of Onset  . Stroke Mother   . Hypertension Mother   . Hyperlipidemia Mother   . Stroke Father   . Transient ischemic attack Father   . Hyperlipidemia Father   .  Hypertension Father   . Heart attack Neg Hx   . Atrial fibrillation Son    Social History  Substance Use Topics  . Smoking status: Former Smoker    Quit date: 06/15/1963  . Smokeless tobacco: Never Used  . Alcohol Use: 0.0 oz/week    0 Standard drinks or equivalent per week     Comment: occasional wine   OB History    No data available     Review of Systems  Constitutional: Negative for diaphoresis.  Respiratory: Positive for shortness of breath.   Cardiovascular: Positive for chest pain.  Gastrointestinal: Negative for nausea.  Neurological: Positive for dizziness.  All other systems reviewed and are  negative.  Allergies  Combigan and Sulfa antibiotics  Home Medications   Prior to Admission medications   Medication Sig Start Date End Date Taking? Authorizing Provider  ADVAIR HFA 230-21 MCG/ACT inhaler USE 2 INHALATIONS TWICE A DAY 08/14/15   Midge Minium, MD  albuterol (PROVENTIL HFA;VENTOLIN HFA) 108 (90 Base) MCG/ACT inhaler Inhale 2 puffs into the lungs every 6 (six) hours as needed for wheezing or shortness of breath.    Historical Provider, MD  amiodarone (PACERONE) 200 MG tablet Take 1 tablet (200 mg total) by mouth 2 (two) times daily. 07/30/15   Sueanne Margarita, MD  anastrozole (ARIMIDEX) 1 MG tablet Take 1 tablet (1 mg total) by mouth daily. 06/20/15   Laurie Panda, NP  apixaban (ELIQUIS) 5 MG TABS tablet Take 1 tablet (5 mg total) by mouth 2 (two) times daily. 05/08/15   Eileen Stanford, PA-C  atorvastatin (LIPITOR) 40 MG tablet Take 1 tablet (40 mg total) by mouth daily. 07/30/15   Sueanne Margarita, MD  bimatoprost (LUMIGAN) 0.03 % ophthalmic drops Place 1 drop into both eyes at bedtime.     Historical Provider, MD  diltiazem (CARDIZEM CD) 180 MG 24 hr capsule Take 1 capsule (180 mg total) by mouth daily. 05/08/15   Eileen Stanford, PA-C  dorzolamide (TRUSOPT) 2 % ophthalmic solution Place 1 drop into both eyes 2 (two) times daily.    Historical Provider, MD  furosemide (LASIX) 20 MG tablet Take 2 tablets (40 mg total) by mouth daily. 08/14/15   Sueanne Margarita, MD  gabapentin (NEURONTIN) 400 MG capsule Take 400 mg by mouth 2 (two) times daily.    Historical Provider, MD  loratadine (CLARITIN) 10 MG tablet Take 10 mg by mouth daily as needed for allergies.    Historical Provider, MD  metoprolol tartrate (LOPRESSOR) 25 MG tablet Take 1 tablet (25 mg total) by mouth 2 (two) times daily. 05/08/15   Eileen Stanford, PA-C  Multiple Vitamins-Iron (MULTIVITAMIN/IRON) TABS Take 1 tablet by mouth daily.     Historical Provider, MD  omeprazole (PRILOSEC) 20 MG capsule Take 20 mg by  mouth daily. TAKE 30 MINUTES BEFORE A MEAL    Historical Provider, MD  thyroid (ARMOUR) 15 MG tablet Take 15 mg by mouth daily.    Historical Provider, MD  traMADol (ULTRAM) 50 MG tablet Take 1 tablet (50 mg total) by mouth every 6 (six) hours as needed. for pain 06/19/15   Midge Minium, MD   BP 140/93 mmHg  Pulse 63  Temp(Src) 97.5 F (36.4 C) (Oral)  Resp 26  Ht '4\' 11"'$  (1.499 m)  Wt 140 lb (63.504 kg)  BMI 28.26 kg/m2  SpO2 95%   Physical Exam  Constitutional: She is oriented to person, place, and time. She appears well-developed  and well-nourished.  HENT:  Head: Normocephalic and atraumatic.  Eyes: Conjunctivae are normal. Pupils are equal, round, and reactive to light.  Neck: Normal range of motion. Neck supple. JVD (60 degrees) present.  Cardiovascular: Normal rate, regular rhythm and normal heart sounds.   Pulmonary/Chest: Effort normal. She has no wheezes. She has rales (L lung). She exhibits no tenderness.  L lung: Pt has few bibasilar rales to auscultation.   Abdominal: Soft. Bowel sounds are normal. She exhibits no distension and no mass. There is no tenderness.  Musculoskeletal: Normal range of motion. She exhibits edema.  Pt has 1+ pitting edema bilaterally and presacral edema.  Neurological: She is alert and oriented to person, place, and time. No cranial nerve deficit. She exhibits normal muscle tone. Coordination normal.  Skin: Skin is warm and dry. No rash noted.  Psychiatric: She has a normal mood and affect. Her behavior is normal. Judgment and thought content normal.  Nursing note and vitals reviewed.  ED Course  Procedures (including critical care time) DIAGNOSTIC STUDIES: Oxygen Saturation is 95% on RA, adequate by my interpretation.   COORDINATION OF CARE: 1:55 AM-Discussed next steps with pt including IV Lasix. Pt verbalized understanding and is agreeable with the plan.   Labs Review Results for orders placed or performed during the hospital  encounter of 56/38/75  Basic metabolic panel  Result Value Ref Range   Sodium 135 135 - 145 mmol/L   Potassium 4.2 3.5 - 5.1 mmol/L   Chloride 105 101 - 111 mmol/L   CO2 24 22 - 32 mmol/L   Glucose, Bld 139 (H) 65 - 99 mg/dL   BUN 27 (H) 6 - 20 mg/dL   Creatinine, Ser 1.34 (H) 0.44 - 1.00 mg/dL   Calcium 8.8 (L) 8.9 - 10.3 mg/dL   GFR calc non Af Amer 38 (L) >60 mL/min   GFR calc Af Amer 44 (L) >60 mL/min   Anion gap 6 5 - 15  CBC  Result Value Ref Range   WBC 8.7 4.0 - 10.5 K/uL   RBC 4.43 3.87 - 5.11 MIL/uL   Hemoglobin 13.5 12.0 - 15.0 g/dL   HCT 42.0 36.0 - 46.0 %   MCV 94.8 78.0 - 100.0 fL   MCH 30.5 26.0 - 34.0 pg   MCHC 32.1 30.0 - 36.0 g/dL   RDW 13.0 11.5 - 15.5 %   Platelets 238 150 - 400 K/uL  Brain natriuretic peptide  Result Value Ref Range   B Natriuretic Peptide 484.1 (H) 0.0 - 100.0 pg/mL  I-stat troponin, ED  Result Value Ref Range   Troponin i, poc 0.00 0.00 - 0.08 ng/mL   Comment 3           Imaging Review Dg Chest 2 View  08/25/2015  CLINICAL DATA:  Acute onset of cough and shortness of breath. Atrial fibrillation. Initial encounter. EXAM: CHEST  2 VIEW COMPARISON:  Chest radiograph performed 05/09/2015 FINDINGS: The lungs are well-aerated. Vascular congestion is noted. Increased interstitial markings raise concern for pulmonary edema. A small left pleural effusion is noted. There is no evidence of pneumothorax. The heart is borderline normal in size. No acute osseous abnormalities are seen. Postoperative change is noted overlying the left chest wall. IMPRESSION: Vascular congestion noted. Increased interstitial markings raise concern for pulmonary edema. Small left pleural effusion noted. Electronically Signed   By: Garald Balding M.D.   On: 08/25/2015 01:39   I have personally reviewed and evaluated these images and lab results as  part of my medical decision-making.   EKG Interpretation   Date/Time:  Sunday August 25 2015 01:03:42 EDT Ventricular Rate:   61 PR Interval:  168 QRS Duration: 80 QT Interval:  526 QTC Calculation: 529 R Axis:   84 Text Interpretation:  Normal sinus rhythm Anterior infarct , age  undetermined Abnormal ECG When compared with ECG of 06/24/2015, Nonspecific  T wave abnormality has improved Confirmed by [facility]  MD, Tayo Maute ([id number]) on  08/25/2015 3:24:53 AM         MDM   Final diagnoses:  Acute on chronic diastolic heart failure (HCC)  Renal insufficiency    CHF exacerbation review of past records shows that she has known history of diastolic heart failure as well as atrial fibrillation. ECG shows that she is not in atrial fibrillation. Chest x-ray shows mild pulmonary edema. Laboratory workup shows stable renal insufficiency and BNP level at about baseline. She's given a dose of furosemide in the ED with good diuresis and subjective improvement. She was ambulated in the ED without any oxygen desaturation. She is discharged with instructions to increase her furosemide to 80 mg a day for the next 3 days and then resume her prior dose of 40 mg once a day. Follow-up with PCP.  I personally performed the services described in this documentation, which was scribed in my presence. The recorded information has been reviewed and is accurate.      Delora Fuel, MD 60/45/40 [id number]

## 2015-08-25 NOTE — ED Notes (Signed)
Ambulated in hall.  Sat remained between 96 and 97%.  Tolerated well.

## 2015-08-30 ENCOUNTER — Encounter: Payer: Self-pay | Admitting: *Deleted

## 2015-08-30 ENCOUNTER — Encounter: Payer: Self-pay | Admitting: Family Medicine

## 2015-08-30 ENCOUNTER — Ambulatory Visit (INDEPENDENT_AMBULATORY_CARE_PROVIDER_SITE_OTHER): Payer: Medicare Other | Admitting: Family Medicine

## 2015-08-30 VITALS — BP 131/83 | HR 81 | Temp 98.1°F | Resp 16 | Ht 59.0 in | Wt 137.4 lb

## 2015-08-30 DIAGNOSIS — I639 Cerebral infarction, unspecified: Secondary | ICD-10-CM

## 2015-08-30 DIAGNOSIS — I5032 Chronic diastolic (congestive) heart failure: Secondary | ICD-10-CM

## 2015-08-30 LAB — BASIC METABOLIC PANEL
BUN: 23 mg/dL (ref 6–23)
CALCIUM: 9.2 mg/dL (ref 8.4–10.5)
CO2: 31 mEq/L (ref 19–32)
Chloride: 104 mEq/L (ref 96–112)
Creatinine, Ser: 1.38 mg/dL — ABNORMAL HIGH (ref 0.40–1.20)
GFR: 39.63 mL/min — AB (ref >60.00)
GLUCOSE: 96 mg/dL (ref 70–99)
POTASSIUM: 4.7 meq/L (ref 3.5–5.1)
SODIUM: 141 meq/L (ref 135–145)

## 2015-08-30 NOTE — Progress Notes (Signed)
   Subjective:    Patient ID: Patricia [name], female    DOB: 1940/12/21, 75 y.o.   MRN: [id number]  HPI ER f/u- pt was seen on 6/25 w/ 6 out of 10 CP for 2 days.  Pt was volume overloaded on xray and PE- 1+ edema, rales.  Her Lasix was increased to '80mg'$  x3 days (per ER notes but '60mg'$  per pt) and then decreased back to '40mg'$  daily.  Pt reports feeling better at this point.  She admits to increased salt in her diet Thursday/Friday.  Was unable to lie down Saturday night due to SOB.  Still has mild swelling in ankles bilaterally.  No SOB, CP.   Review of Systems For ROS see HPI     Objective:   Physical Exam  Constitutional: She is oriented to person, place, and time. She appears well-developed and well-nourished. No distress.  HENT:  Head: Normocephalic and atraumatic.  Eyes: Conjunctivae and EOM are normal. Pupils are equal, round, and reactive to light.  Neck: Normal range of motion. Neck supple. No thyromegaly present.  Cardiovascular: Normal rate, normal heart sounds and intact distal pulses.   No murmur heard. Irregularly irregular  Pulmonary/Chest: Effort normal and breath sounds normal. No respiratory distress.  Abdominal: Soft. She exhibits no distension. There is no tenderness.  Musculoskeletal: She exhibits edema (trace edema on R). She exhibits no tenderness.  Lymphadenopathy:    She has no cervical adenopathy.  Neurological: She is alert and oriented to person, place, and time.  Skin: Skin is warm and dry.  Psychiatric: She has a normal mood and affect. Her behavior is normal.  Vitals reviewed.         Assessment & Plan:

## 2015-08-30 NOTE — Progress Notes (Signed)
Pre visit review using our clinic review tool, if applicable. No additional management support is needed unless otherwise documented below in the visit note. 

## 2015-08-30 NOTE — Patient Instructions (Signed)
Follow up as needed We'll notify you of your lab results and make any changes if needed Continue the Furosemide 2 tabs daily Try and limit your salt intake Call with any questions or concerns Happy 4th!!!

## 2015-08-30 NOTE — Assessment & Plan Note (Signed)
Pt w/ recent CHF exacerbation after eating high salt diet for 2-3 days.  She is now asymptomatic since increasing Lasix x3 days.  Reviewed lifestyle and dietary modifications to help keep her out of ER.  Check BMP to assess electrolytes in setting of increased lasix dose.  Reviewed supportive care and red flags that should prompt return.  Pt expressed understanding and is in agreement w/ plan.

## 2015-09-02 DIAGNOSIS — C50412 Malignant neoplasm of upper-outer quadrant of left female breast: Secondary | ICD-10-CM | POA: Diagnosis not present

## 2015-09-06 ENCOUNTER — Telehealth: Payer: Self-pay | Admitting: Cardiology

## 2015-09-06 MED ORDER — FUROSEMIDE 20 MG PO TABS: 20.0000 mg | ORAL_TABLET | Freq: Every day | ORAL | Status: DC

## 2015-09-06 NOTE — Telephone Encounter (Signed)
Pt received the letter we sent out for her to call us back to get her lab results.  On the lab results, she was to decrease the Lasix 20 mg to 1 daily and repeat bmet on her f/u app 09/09/15. I spoke with Dr. Acie Fredrickson since the las lab was 08/23/15 and pt is just now calling for her results. Per Dr. Acie Fredrickson, go ahead and have her cut her lasix to 20 mg daily and repeat bmet 09/09/15.  Pt agreeable with this plan.

## 2015-09-06 NOTE — Telephone Encounter (Signed)
Pt got letter regarding labs-pls call

## 2015-09-08 NOTE — Progress Notes (Signed)
Cardiology Office Note:    Date:  09/09/2015   ID:  [name], DOB [birth year], MRN [id number]  PCP:  Annye Asa, MD  Cardiologist:  Dr. Fransico Him   Electrophysiologist:  n/a  Referring MD: Midge Minium, MD   Chief Complaint  Patient presents with  . Follow-up    PAF, CHF    History of Present Illness:     Patricia [name] is a 75 y.o. female with a hx of Lung CA status post resection in 2003, breast CA status post lumpectomy, tamoxifen and radiation therapy, GERD, hypothyroidism, HTN, HL, diastolic CHF, mild to moderate AI, prior CVA and PAF. She underwent DCCV in 4/17. In follow-up, she was noted to be back in atrial fibrillation. She was placed on amiodarone. Amiodarone level was low in follow-up in her dose was adjusted. Last seen in this office by Robbie Lis, PA-C 08/23/15.  She remained in atrial fibrillation. Amiodarone dose had not been adjusted as previously recommended. At that visit, her amiodarone was increased to 400 mg twice a day 1 week, then 300 mg daily. She returns for follow-up.   Here with her husband.  Since last seen, she developed volume excess.  Her PCP increased her Lasix for several days.  The patient notes return of symptoms of DOE, orthopnea, PND.  She notes chest pressure with lying flat when she gets short of breath that resolves with sitting up. She denies exertional chest pain.  She denies syncope.  She she notes a non-productive cough.    Past Medical History  Diagnosis Date  . Cough variant asthma   . Osteopenia   . Stricture and stenosis of cervix   . Postmenopausal   . GERD (gastroesophageal reflux disease)   . Disease of pharynx or nasopharynx   . Benign essential HTN   . Allergic rhinitis, cause unspecified   . Diverticulosis   . Internal hemorrhoids   . Hiatal hernia   . Laryngospasm   . 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 Hyperlipidemia   . Glaucoma   . Laryngospasm   . Multinodular goiter   .  Chronic cough   . 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 Radiation 10/22/14-11/23/14    Left Breast  . Chronic diastolic heart failure ([place]) 06/17/2015  . Aortic regurgitation 06/17/2015    Mild to moderate AR by echo 04/2015  . PAF (paroxysmal atrial fibrillation) (HCC)     CHADS2VASC score is 7 - on Apixaban.  S/P DCCV 06/2015    Past Surgical History  Procedure Laterality Date  . Lung cancer surgery  2003    resection carcinoid lingula-lt upper lobe  . Colonoscopy    . Radioactive seed guided mastectomy with axillary sentinel lymph node biopsy Left 06/26/2014    Procedure: RADIOACTIVE SEED GUIDED PARTIAL MASTECTOMY WITH AXILLARY SENTINEL LYMPH NODE BIOPSY;  Surgeon: Stark Klein, MD;  Location: [facility];  Service: General;  Laterality: Left;  . Breast surgery    . Re-excision of breast lumpectomy Left 08/07/2014    Procedure: RE-EXCISION OF LEFT BREAST LUMPECTOMY;  Surgeon: Stark Klein, MD;  Location: [place];  Service: General;  Laterality: Left;  . Cardioversion N/A 06/24/2015    Procedure: CARDIOVERSION;  Surgeon: Sueanne Margarita, MD;  Location: [facility];  Service: Cardiovascular;  Laterality: N/A;    Current Medications: Outpatient Prescriptions Prior to Visit  Medication Sig Dispense Refill  . ADVAIR HFA  230-21 MCG/ACT inhaler USE 2 INHALATIONS TWICE A DAY 36 g 1  . albuterol (PROVENTIL HFA;VENTOLIN HFA) 108 (90 Base) MCG/ACT inhaler Inhale 2 puffs into the lungs every 6 (six) hours as needed for wheezing or shortness of breath.    Marland Kitchen amiodarone (PACERONE) 200 MG tablet Take 1 tablet (200 mg total) by mouth 2 (two) times daily. 60 tablet 6  . anastrozole (ARIMIDEX) 1 MG tablet Take 1 tablet (1 mg total) by mouth daily. 90 tablet 3  . apixaban (ELIQUIS) 5 MG TABS tablet Take 1 tablet (5 mg total) by mouth 2 (two) times daily. 180 tablet 3  . atorvastatin (LIPITOR) 40 MG tablet Take 1 tablet (40 mg total) by mouth daily. 30 tablet 6  . bimatoprost  (LUMIGAN) 0.03 % ophthalmic drops Place 1 drop into both eyes at bedtime.     . dorzolamide (TRUSOPT) 2 % ophthalmic solution Place 1 drop into both eyes 2 (two) times daily.    Marland Kitchen gabapentin (NEURONTIN) 400 MG capsule Take 400 mg by mouth 2 (two) times daily.    Marland Kitchen loratadine (CLARITIN) 10 MG tablet Take 10 mg by mouth daily as needed for allergies.    . metoprolol tartrate (LOPRESSOR) 25 MG tablet Take 1 tablet (25 mg total) by mouth 2 (two) times daily. 180 tablet 3  . Multiple Vitamins-Iron (MULTIVITAMIN/IRON) TABS Take 1 tablet by mouth daily.     Marland Kitchen omeprazole (PRILOSEC) 20 MG capsule Take 20 mg by mouth daily. TAKE 30 MINUTES BEFORE A MEAL    . thyroid (ARMOUR) 15 MG tablet Take 15 mg by mouth daily.    Marland Kitchen diltiazem (CARDIZEM CD) 180 MG 24 hr capsule Take 1 capsule (180 mg total) by mouth daily. 90 capsule 3  . furosemide (LASIX) 20 MG tablet Take 1 tablet (20 mg total) by mouth daily. (Patient not taking: Reported on 09/09/2015) 30 tablet 11  . traMADol (ULTRAM) 50 MG tablet Take 1 tablet (50 mg total) by mouth every 6 (six) hours as needed. for pain (Patient not taking: Reported on 09/09/2015) 90 tablet 0   No facility-administered medications prior to visit.      Allergies:   Combigan and Sulfa antibiotics   Social History   Social History  . Marital Status: Married    Spouse Name: N/A  . Number of Children: N/A  . Years of Education: N/A   Social History Main Topics  . Smoking status: Former Smoker    Quit date: 06/15/1963  . Smokeless tobacco: Never Used  . Alcohol Use: 0.0 oz/week    0 Standard drinks or equivalent per week     Comment: occasional wine  . Drug Use: No  . Sexual Activity: Not Asked   Other Topics Concern  . None   Social History Narrative     Family History:  The patient's family history includes Atrial fibrillation in her son; Hyperlipidemia in her father and mother; Hypertension in her father and mother; Stroke in her father and mother; Transient  ischemic attack in her father. There is no history of Heart attack.   ROS:   Please see the history of present illness.    Review of Systems  Cardiovascular: Positive for chest pain, dyspnea on exertion and leg swelling.  Respiratory: Positive for cough and shortness of breath.    All other systems reviewed and are negative.   Physical Exam:    VS:  BP 90/50 mmHg  Pulse 52  Ht '4\' 11"'$  (1.499 m)  Wt 137  lb (62.143 kg)  BMI 27.66 kg/m2  SpO2 95%   Physical Exam  Constitutional: She is oriented to person, place, and time. She appears well-developed and well-nourished.  HENT:  Head: Normocephalic and atraumatic.  Neck:  JVP 7-8 cm  Cardiovascular: Normal rate, regular rhythm, S1 normal and S2 normal.  Exam reveals no gallop.   No murmur heard. Pulmonary/Chest: Effort normal and breath sounds normal. She has no wheezes. She has no rales.  Abdominal: Soft. There is no tenderness.  Musculoskeletal:  1+ bilateral LE edema  Neurological: She is alert and oriented to person, place, and time.  Skin: Skin is warm and dry.  Psychiatric: She has a normal mood and affect.    Wt Readings from Last 3 Encounters:  09/09/15 137 lb (62.143 kg)  08/30/15 137 lb 6 oz (62.313 kg)  08/25/15 140 lb (63.504 kg)      Studies/Labs Reviewed:     EKG:  EKG is  ordered today.  The ekg ordered today demonstrates Sinus bradycardia, HR 52, normal axis, QTc of 580 ms  Recent Labs: 04/27/2015: TSH 2.802 05/01/2015: Magnesium 2.1 08/25/2015: B Natriuretic Peptide 484.1*; Hemoglobin 13.5; Platelets 238 08/30/2015: BUN 23; Creatinine, Ser 1.38*; Potassium 4.7; Sodium 141 09/09/2015: ALT 28   Recent Lipid Panel    Component Value Date/Time   CHOL 126 09/09/2015 1031   TRIG 71 09/09/2015 1031   TRIG 52 01/13/2006 1012   HDL 58 09/09/2015 1031   CHOLHDL 2.2 09/09/2015 1031   CHOLHDL 3.0 CALC 01/13/2006 1012   VLDL 14 09/09/2015 1031   LDLCALC 54 09/09/2015 1031   LDLDIRECT 174.3 06/04/2009 0000    LDLDIRECT 158.9 01/13/2006 1012    Additional studies/ records that were reviewed today include:   Myoview 04/30/15 IMPRESSION: 1. No reversible ischemia or infarction. 2. Normal left ventricular wall motion.  3. Left ventricular ejection fraction 60%  4. Low-risk stress test findings*.  Echo 04/28/15 EF 55-60%, no RWMA, mild to mod AI, severe LAE, mild TR   ASSESSMENT:     1. Acute on chronic diastolic CHF (congestive heart failure) (HCC)   2. Persistent atrial fibrillation ([place])   3. Aortic regurgitation   4. Benign essential HTN   5. CKD (chronic kidney disease), stage 3 (moderate)     PLAN:     In order of problems listed above:  1. A/C Diastolic CHF - She is mildly volume overloaded on exam. She felt better on furosemide 40 mg in the morning and 20 mg in the evening. Diuresis has been somewhat limited by increased creatinine. I suspect that her volume overload is exacerbated by chronic kidney disease. She seems to be adherent to a low-salt diet.  -  BMET, magnesium today  -  Increase Lasix to 40 mg in the morning and 20 mg in the afternoon  -  BMET 1 week  2. Persistent AF - She is back in sinus rhythm today. Heart rate is somewhat slow and her blood pressure is also low. I will cut back on her diltiazem to 120 mg daily. She remains on amiodarone 200 mg twice a day (instead of 300 mg QD. QTc is somewhat prolonged. I reviewed this with Dr. Rayann Heman (DOD). He prefers that I discuss this with Dr. Radford Pax. I will be in touch with the patient once we have made a decision regarding her amiodarone dose.  Continue Eliquis.  3. Aortic Insufficiency - Mild to mod by last echo. Plan repeat Echo in 2/18.    4.  HTN - BP now running low. She is not symptomatic. Decrease Cardizem to 120 mg QD.  5. CKD - I suspect her baseline creatinine is 1.2-1.4.  We will have to likely accept a higher creatinine level to keep her volume status stable.    Medication Adjustments/Labs and Tests  Ordered: Current medicines are reviewed at length with the patient today.  Concerns regarding medicines are outlined above.  Medication changes, Labs and Tests ordered today are outlined in the Patient Instructions noted below. Patient Instructions  Medication Instructions:  1. DECREASE CARDIZEM TO 120 MG DAILY; NEW RX SENT TO [facility] 2. INCREASE LASIX TO 40 MG IN THE MORNING AND 20 MG IN THE PM; RX SENT TO [facility]  Labwork: 1. TODAY BMET, MAGNESIUM LEVEL, BNP 2. IN 1 WEEK BMET 09/19/15  Testing/Procedures: NONE Follow-Up: 10/03/15 @ 9:45 WITH Patricia [name], PAC  Any Other Special Instructions Will Be Listed Below (If Applicable). If you need a refill on your cardiac medications before your next appointment, please call your pharmacy.    Signed, Richardson Dopp, PA-C  09/09/2015 [facility] [place], [place],   [address] Phone: [phone]; Fax: [phone]

## 2015-09-09 ENCOUNTER — Other Ambulatory Visit (INDEPENDENT_AMBULATORY_CARE_PROVIDER_SITE_OTHER): Payer: Medicare Other | Admitting: *Deleted

## 2015-09-09 ENCOUNTER — Encounter: Payer: Self-pay | Admitting: Physician Assistant

## 2015-09-09 ENCOUNTER — Ambulatory Visit (INDEPENDENT_AMBULATORY_CARE_PROVIDER_SITE_OTHER): Payer: Medicare Other | Admitting: Physician Assistant

## 2015-09-09 ENCOUNTER — Other Ambulatory Visit: Payer: Self-pay | Admitting: Cardiology

## 2015-09-09 VITALS — BP 90/50 | HR 52 | Ht 59.0 in | Wt 137.0 lb

## 2015-09-09 DIAGNOSIS — N183 Chronic kidney disease, stage 3 (moderate): Secondary | ICD-10-CM

## 2015-09-09 DIAGNOSIS — I4819 Other persistent atrial fibrillation: Secondary | ICD-10-CM

## 2015-09-09 DIAGNOSIS — I5033 Acute on chronic diastolic (congestive) heart failure: Secondary | ICD-10-CM | POA: Diagnosis not present

## 2015-09-09 DIAGNOSIS — E785 Hyperlipidemia, unspecified: Secondary | ICD-10-CM | POA: Diagnosis not present

## 2015-09-09 DIAGNOSIS — I10 Essential (primary) hypertension: Secondary | ICD-10-CM

## 2015-09-09 DIAGNOSIS — I639 Cerebral infarction, unspecified: Secondary | ICD-10-CM

## 2015-09-09 DIAGNOSIS — I351 Nonrheumatic aortic (valve) insufficiency: Secondary | ICD-10-CM

## 2015-09-09 DIAGNOSIS — I481 Persistent atrial fibrillation: Secondary | ICD-10-CM | POA: Diagnosis not present

## 2015-09-09 LAB — HEPATIC FUNCTION PANEL
ALBUMIN: 3.7 g/dL (ref 3.6–5.1)
ALK PHOS: 68 U/L (ref 33–130)
ALT: 28 U/L (ref 6–29)
AST: 18 U/L (ref 10–35)
BILIRUBIN INDIRECT: 0.5 mg/dL (ref 0.2–1.2)
Bilirubin, Direct: 0.1 mg/dL (ref <=0.2)
TOTAL PROTEIN: 5.8 g/dL — AB (ref 6.1–8.1)
Total Bilirubin: 0.6 mg/dL (ref 0.2–1.2)

## 2015-09-09 LAB — LIPID PANEL
Cholesterol: 126 mg/dL (ref 125–200)
HDL: 58 mg/dL (ref >=46)
LDL CALC: 54 mg/dL (ref <130)
Total CHOL/HDL Ratio: 2.2 Ratio (ref <=5.0)
Triglycerides: 71 mg/dL (ref <150)
VLDL: 14 mg/dL (ref <30)

## 2015-09-09 MED ORDER — DILTIAZEM HCL ER COATED BEADS 120 MG PO CP24: 120.0000 mg | ORAL_CAPSULE | Freq: Every day | ORAL | Status: DC

## 2015-09-09 MED ORDER — FUROSEMIDE 20 MG PO TABS: 20.0000 mg | ORAL_TABLET | ORAL | Status: DC

## 2015-09-09 NOTE — Patient Instructions (Addendum)
Medication Instructions:  1. DECREASE CARDIZEM TO 120 MG DAILY; NEW RX SENT TO [facility] 2. INCREASE LASIX TO 40 MG IN THE MORNING AND 20 MG IN THE PM; RX SENT TO [facility]  Labwork: 1. TODAY BMET, MAGNESIUM LEVEL,  2. IN 1 WEEK BMET 09/20/15 Testing/Procedures: NONE Follow-Up: 10/03/15 @ 9:45 WITH SCOTT WEAVER, PAC  Any Other Special Instructions Will Be Listed Below (If Applicable). If you need a refill on your cardiac medications before your next appointment, please call your pharmacy.

## 2015-09-09 NOTE — Addendum Note (Signed)
Addended by: Eulis Foster on: 09/09/2015 10:30 AM   Modules accepted: Orders

## 2015-09-10 ENCOUNTER — Other Ambulatory Visit: Payer: Self-pay

## 2015-09-10 ENCOUNTER — Telehealth: Payer: Self-pay | Admitting: Physician Assistant

## 2015-09-10 DIAGNOSIS — I4819 Other persistent atrial fibrillation: Secondary | ICD-10-CM

## 2015-09-10 DIAGNOSIS — I5033 Acute on chronic diastolic (congestive) heart failure: Secondary | ICD-10-CM

## 2015-09-10 LAB — BASIC METABOLIC PANEL
BUN: 23 mg/dL (ref 7–25)
CHLORIDE: 103 mmol/L (ref 98–110)
CO2: 28 mmol/L (ref 20–31)
Calcium: 8.3 mg/dL — ABNORMAL LOW (ref 8.6–10.4)
Creat: 1.39 mg/dL — ABNORMAL HIGH (ref 0.60–0.93)
GLUCOSE: 104 mg/dL — AB (ref 65–99)
POTASSIUM: 3.9 mmol/L (ref 3.5–5.3)
SODIUM: 140 mmol/L (ref 135–146)

## 2015-09-10 LAB — MAGNESIUM: Magnesium: 2.1 mg/dL (ref 1.5–2.5)

## 2015-09-10 MED ORDER — AMIODARONE HCL 200 MG PO TABS: 200.0000 mg | ORAL_TABLET | Freq: Every day | ORAL | Status: DC

## 2015-09-10 NOTE — Telephone Encounter (Signed)
Ptcb and has been advised per Dr. Radford Pax and Brynda Rim. PA to decrease Amiodarone to 200 mg daily and that she will need an EKG this Friday per PA and Dr. Radford Pax . Pt agreeable to plan of care.

## 2015-09-10 NOTE — Telephone Encounter (Signed)
Please tell patient I reviewed her case with Dr. Fransico Him. She would like the patient to decrease her Amiodarone to 200 mg QD. Have the patient come in Friday AM for an ECG with the RN. Richardson Dopp, PA-C   09/10/2015 8:56 AM

## 2015-09-10 NOTE — Addendum Note (Signed)
Addended by: Michae Kava on: 09/10/2015 10:38 AM   Modules accepted: Orders

## 2015-09-10 NOTE — Telephone Encounter (Deleted)
furosemide (LASIX) 20 MG tablet  Medication   Date: 09/09/2015  Department: [address] Office  Ordering/Authorizing: Liliane Shi, PA-C      Order Providers    Prescribing Provider Encounter Provider   Liliane Shi, PA-C Liliane Shi, PA-C    Supervision Information    Supervising Provider Type of Supervision   Thompson Grayer, MD Incident To    Medication Detail      Disp Refills Start End     furosemide (LASIX) 20 MG tablet 270 tablet 3 09/09/2015     Sig - Route: Take 1 tablet (20 mg total) by mouth as directed. Take 2 tabs in the morning and 1 tab in the pm - Oral    E-Prescribing Status: Receipt confirmed by pharmacy (09/09/2015 4:41 PM EDT)     Associated Diagnoses    Acute on chronic diastolic CHF (congestive heart failure) ([place]) - [place] on file with [facility] as a 90 day supply as of 09/09/15

## 2015-09-10 NOTE — Telephone Encounter (Signed)
Lmtcb to s/w pt in regards to Amiodarone dose change per Brynda Rim. PA and Dr. Radford Pax. Pt will need Nurse visit appt for EKG 7/14 per [employer]. PA

## 2015-09-10 NOTE — Telephone Encounter (Signed)
furosemide (LASIX) 20 MG tablet  Medication   Date: 09/09/2015  Department: [address] Office  Ordering/Authorizing: Liliane Shi, PA-C      Order Providers    Prescribing Provider Encounter Provider   Liliane Shi, PA-C Liliane Shi, PA-C    Supervision Information    Supervising Provider Type of Supervision   Thompson Grayer, MD Incident To    Medication Detail      Disp Refills Start End     furosemide (LASIX) 20 MG tablet 270 tablet 3 09/09/2015     Sig - Route: Take 1 tablet (20 mg total) by mouth as directed. Take 2 tabs in the morning and 1 tab in the pm - Oral    E-Prescribing Status: Receipt confirmed by pharmacy (09/09/2015 4:41 PM EDT)     Associated Diagnoses    Acute on chronic diastolic CHF (congestive heart failure) ([place]) - [place], [place] [facility] say supply as of 09/09/15

## 2015-09-11 ENCOUNTER — Telehealth: Payer: Self-pay | Admitting: Physician Assistant

## 2015-09-11 NOTE — Telephone Encounter (Signed)
Spoke with pharmacist at [facility] and verified Furosemide 20 mg 2 tablets in the morning and 1 in the evening

## 2015-09-11 NOTE — Telephone Encounter (Signed)
New message      Calling to get clairfications on directions on furosemide tab '20mg'$ .  Please use ref num [id number]. Please call before fri.

## 2015-09-12 ENCOUNTER — Encounter: Payer: Self-pay | Admitting: Nurse Practitioner

## 2015-09-12 ENCOUNTER — Ambulatory Visit (INDEPENDENT_AMBULATORY_CARE_PROVIDER_SITE_OTHER): Payer: Medicare Other | Admitting: Nurse Practitioner

## 2015-09-12 VITALS — BP 115/74 | HR 52 | Ht 59.0 in | Wt 133.8 lb

## 2015-09-12 DIAGNOSIS — I639 Cerebral infarction, unspecified: Secondary | ICD-10-CM

## 2015-09-12 DIAGNOSIS — I63412 Cerebral infarction due to embolism of left middle cerebral artery: Secondary | ICD-10-CM

## 2015-09-12 DIAGNOSIS — I10 Essential (primary) hypertension: Secondary | ICD-10-CM | POA: Diagnosis not present

## 2015-09-12 DIAGNOSIS — I481 Persistent atrial fibrillation: Secondary | ICD-10-CM

## 2015-09-12 DIAGNOSIS — I4819 Other persistent atrial fibrillation: Secondary | ICD-10-CM

## 2015-09-12 NOTE — Progress Notes (Signed)
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 She has a history of stroke event in February 2017. She has not had further stroke or TIA symptoms. She is currently on Eliquis for secondary stroke prevention and atrial fibrillation along with Lipitor. She recently had some fluid retention and was placed on Lasix. She says she is playing some golf and is back to most of her normal activities. She returns for reevaluation  HISTORY: 06/04/15 CMMary [name] is an 75 y.o. female returns for hospital follow-up after a stroke in February. She was originally admitted for evaluation of chest pain . On admission she stated that her speech had been slurred for 2 days. A new diagnosis of paroxysmal atrial fibrillation was made. Given the slurred speech and atrial fibrillation, possible stroke was suspected. 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 Shower emboli were suspected based upon the overall MRI appearance. Patient was not considered for TPA secondary to delay in arrival. CT angiogram of the head and neck was unremarkable. 2-D echo no source of embolism. LDL 95. Hemoglobin A1c 5.7. She was switched to Eliquis from daily aspirin prior to admission. She was changed to Crestor for her LDL however her insurance would not cover this. She was placed on Lipitor 80 mg, but Dr. Radford Pax reduced to 40 mg at follow-up visit. She has not had further stroke or TIA symptoms. She does complain of some fatigue she returns for reevaluation REVIEW OF SYSTEMS: Full 14 system review of systems performed and notable only for those listed, all others are neg:  Constitutional: neg  Cardiovascular:  neg Ear/Nose/Throat: neg  Skin: neg Eyes: neg Respiratory: neg Gastroitestinal: neg  Hematology/Lymphatic: neg  Endocrine: neg Musculoskeletal:neg Allergy/Immunology: neg Neurological: neg Psychiatric: neg Sleep : neg   ALLERGIES: Allergies  Allergen Reactions  . Combigan [Brimonidine Tartrate-Timolol] Itching    Eyes itch, reddened  . Sulfa Antibiotics Rash    HOME MEDICATIONS: Outpatient Prescriptions Prior to Visit  Medication Sig Dispense Refill  . ADVAIR HFA 230-21 MCG/ACT inhaler USE 2 INHALATIONS TWICE A DAY 36 g 1  . albuterol (PROVENTIL HFA;VENTOLIN HFA) 108 (90 Base) MCG/ACT inhaler Inhale 2 puffs into the lungs every 6 (six) hours as needed for wheezing or shortness of breath.    Marland Kitchen amiodarone (PACERONE) 200 MG tablet Take 1 tablet (200 mg total) by mouth daily.    Marland Kitchen anastrozole (ARIMIDEX) 1 MG tablet Take 1 tablet (1 mg total) by mouth daily. 90 tablet 3  . apixaban (ELIQUIS) 5 MG TABS tablet Take 1 tablet (5 mg total) by mouth 2 (two) times daily. 180 tablet 3  . atorvastatin (LIPITOR) 40 MG tablet Take 1 tablet (40 mg total) by mouth daily. 30 tablet 6  . bimatoprost (LUMIGAN) 0.03 % ophthalmic drops Place 1 drop into both eyes at bedtime.     Marland Kitchen diltiazem (CARDIZEM CD) 120 MG 24 hr capsule Take 1 capsule (120 mg total) by mouth daily. 30 capsule 3  . dorzolamide (TRUSOPT) 2 % ophthalmic solution Place 1 drop into both eyes 2 (two) times daily.    . furosemide (LASIX) 20 MG tablet Take 1 tablet (20 mg total) by mouth as directed. Take 2 tabs in the morning and 1 tab in the pm 270 tablet  3  . gabapentin (NEURONTIN) 400 MG capsule Take 400 mg by mouth 2 (two) times daily.    Marland Kitchen loratadine (CLARITIN) 10 MG tablet Take 10 mg by mouth daily as needed for allergies.    . metoprolol tartrate (LOPRESSOR) 25 MG tablet Take 1 tablet (25 mg total) by mouth 2 (two) times daily. 180 tablet 3  . Multiple Vitamins-Iron (MULTIVITAMIN/IRON) TABS Take 1 tablet by mouth daily.     Marland Kitchen  omeprazole (PRILOSEC) 20 MG capsule Take 20 mg by mouth daily. TAKE 30 MINUTES BEFORE A MEAL    . thyroid (ARMOUR) 15 MG tablet Take 15 mg by mouth daily.    . traMADol (ULTRAM) 50 MG tablet Take 50 mg by mouth every 6 (six) hours as needed for moderate pain.     No facility-administered medications prior to visit.    PAST MEDICAL HISTORY: Past Medical History  Diagnosis Date  . Cough variant asthma   . Osteopenia   . Stricture and stenosis of cervix   . Postmenopausal   . GERD (gastroesophageal reflux disease)   . Disease of pharynx or nasopharynx   . Benign essential HTN   . Allergic rhinitis, cause unspecified   . Diverticulosis   . Internal hemorrhoids   . Hiatal hernia   . Laryngospasm   . 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 Hyperlipidemia   . Glaucoma   . Laryngospasm   . Multinodular goiter   . Chronic cough   . 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 Radiation 10/22/14-11/23/14    Left Breast  . Chronic diastolic heart failure ([place]) 06/17/2015  . Aortic regurgitation 06/17/2015    Mild to moderate AR by echo 04/2015  . PAF (paroxysmal atrial fibrillation) (HCC)     CHADS2VASC score is 7 - on Apixaban.  S/P DCCV 06/2015    PAST SURGICAL HISTORY: Past Surgical History  Procedure Laterality Date  . Lung cancer surgery  2003    resection carcinoid lingula-lt upper lobe  . Colonoscopy    . Radioactive seed guided mastectomy with axillary sentinel lymph node biopsy Left 06/26/2014    Procedure: RADIOACTIVE SEED GUIDED PARTIAL MASTECTOMY WITH AXILLARY SENTINEL LYMPH NODE BIOPSY;  Surgeon: Stark Klein, MD;  Location: [place];  Service: General;  Laterality: Left;  . Breast surgery    . Re-excision of breast lumpectomy Left 08/07/2014    Procedure: RE-EXCISION OF LEFT BREAST LUMPECTOMY;  Surgeon: Stark Klein, MD;  Location: [place];  Service: General;  Laterality: Left;  . Cardioversion N/A 06/24/2015    Procedure:  CARDIOVERSION;  Surgeon: Sueanne Margarita, MD;  Location: [facility];  Service: Cardiovascular;  Laterality: N/A;    FAMILY HISTORY: Family History  Problem Relation Age of Onset  . Stroke Mother   . Hypertension Mother   . Hyperlipidemia Mother   . Stroke Father   . Transient ischemic attack Father   . Hyperlipidemia Father   . Hypertension Father   . Heart attack Neg Hx   . Atrial fibrillation Son     SOCIAL HISTORY: Social History   Social History  . Marital Status: Married    Spouse Name: N/A  . Number of Children: N/A  . Years of Education: N/A   Occupational History  . Not on file.   Social History Main Topics  . Smoking status: Former Smoker    Quit date: 06/15/1963  . Smokeless tobacco: Never  Used  . Alcohol Use: 0.0 oz/week    0 Standard drinks or equivalent per week     Comment: occasional wine  . Drug Use: No  . Sexual Activity: Not on file   Other Topics Concern  . Not on file   Social History Narrative     PHYSICAL EXAM  Filed Vitals:   09/12/15 0859  BP: 115/74  Pulse: 52  Height: '4\' 11"'$  (1.499 m)  Weight: 133 lb 12.8 oz (60.691 kg)   Body mass index is 27.01 kg/(m^2). Generalized: Well developed, in no acute distress  Head: normocephalic and atraumatic,. Oropharynx benign  Neck: Supple, no carotid bruits  Cardiac: regular rate and rhythm no murmur Skin no peripheral edema  Musculoskeletal: No deformity   Neurological examination   Mentation: Alert oriented to time, place, history taking. Attention span and concentration appropriate. Recent and remote memory intact. Follows all commands speech and language fluent.   Cranial nerve II-XII: Pupils were equal round reactive to light extraocular movements were full, visual field were full on confrontational test. Facial sensation and strength were normal. hearing was intact to finger rubbing bilaterally. Uvula tongue midline. head turning and shoulder shrug were normal and  symmetric.Tongue protrusion into cheek strength was normal. Motor: normal bulk and tone, full strength in the BUE, BLE, fine finger movements normal, no pronator drift. No focal weakness Sensory: normal and symmetric to light touch, pinprick and vibratory in the upper and lower extremities  Coordination: finger-nose-finger, heel-to-shin bilaterally, no dysmetria Reflexes: 1+ upper lower and symmetric plantar responses were flexor bilaterally. Gait and Station: Rising up from seated position without assistance, normal stance, moderate stride, good arm swing, smooth turning, able to perform tiptoe, and heel walking without difficulty. Tandem gait is steady  DIAGNOSTIC DATA (LABS, IMAGING, TESTING) - I reviewed patient records, labs, notes, testing and imaging myself where available.  Lab Results  Component Value Date   WBC 8.7 08/25/2015   HGB 13.5 08/25/2015   HCT 42.0 08/25/2015   MCV 94.8 08/25/2015   PLT 238 08/25/2015      Component Value Date/Time   NA 140 09/09/2015 1031   NA 141 06/13/2015 1340   K 3.9 09/09/2015 1031   K 4.0 06/13/2015 1340   CL 103 09/09/2015 1031   CO2 28 09/09/2015 1031   CO2 25 06/13/2015 1340   GLUCOSE 104* 09/09/2015 1031   GLUCOSE 124 06/13/2015 1340   GLUCOSE 88 01/13/2006 1012   BUN 23 09/09/2015 1031   BUN 20.5 06/13/2015 1340   CREATININE 1.39* 09/09/2015 1031   CREATININE 1.38* 08/30/2015 1139   CREATININE 1.1 06/13/2015 1340   CALCIUM 8.3* 09/09/2015 1031   CALCIUM 8.7 06/13/2015 1340   PROT 5.8* 09/09/2015 1031   PROT 6.3* 06/13/2015 1340   ALBUMIN 3.7 09/09/2015 1031   ALBUMIN 3.5 06/13/2015 1340   AST 18 09/09/2015 1031   AST 17 06/13/2015 1340   ALT 28 09/09/2015 1031   ALT 15 06/13/2015 1340   ALKPHOS 68 09/09/2015 1031   ALKPHOS 57 06/13/2015 1340   BILITOT 0.6 09/09/2015 1031   BILITOT 0.36 06/13/2015 1340   GFRNONAA 38* 08/25/2015 0104   GFRAA 44* 08/25/2015 0104   Lab Results  Component Value Date   CHOL 126  09/09/2015   HDL 58 09/09/2015   LDLCALC 54 09/09/2015   LDLDIRECT 174.3 06/04/2009   TRIG 71 09/09/2015   CHOLHDL 2.2 09/09/2015   Lab Results  Component Value Date   HGBA1C 5.7* 04/29/2015  No results found for: VITAMINB12 Lab Results  Component Value Date   TSH 2.802 04/27/2015      ASSESSMENT AND PLAN 75 y.o. year old female with hospital admission for right MCA infarct secondary to newly diagnosed atrial fibrillation.She has risk factors of hypertension, hyperlipidemia.  PLAN: Monitoring stroke risk factors Continue Eliquis for atrial fibrillation and secondary stroke prevention Blood pressure in excellent control today 989/21 keep systolic less than 194 continue present management Continue statin Lipitor 40 mg daily for secondary stroke prevention  Heart healthy low sodium diet Continue exercise as tolerated Stay well hydrated Follow-up in 6 months Dennie Bible, [facility], [facility], [facility] [address], [place], [place] [address] [phone]

## 2015-09-12 NOTE — Patient Instructions (Addendum)
Continue Eliquis for atrial fibrillation and secondary stroke prevention Blood pressure in excellent control today 017/79 keep systolic less than 390 continue present management Continue statin Lipitor 40 mg daily for secondary stroke prevention  Heart healthy low sodium diet Continue exercise as tolerated Stay well hydrated Follow-up in 6 months

## 2015-09-12 NOTE — Progress Notes (Signed)
I reviewed above note and agree with the assessment and plan.  If she continues to do well, we may send her back to her PCP after next visit. Thanks.  Rosalin Hawking, MD PhD Stroke Neurology 09/12/2015 6:19 PM

## 2015-09-13 ENCOUNTER — Ambulatory Visit (INDEPENDENT_AMBULATORY_CARE_PROVIDER_SITE_OTHER): Payer: Medicare Other

## 2015-09-13 DIAGNOSIS — I4819 Other persistent atrial fibrillation: Secondary | ICD-10-CM

## 2015-09-13 DIAGNOSIS — I481 Persistent atrial fibrillation: Secondary | ICD-10-CM | POA: Diagnosis not present

## 2015-09-13 NOTE — Patient Instructions (Addendum)
**Note De-Identified  Obfuscation** The pt arrives in the office for an EKG. On 7/11 the pt was advised per Dr Radford Pax and Richardson Dopp, PA-c to decrease Amiodarone to 200 mg daily and to come to the office this morning for an EKG. EKG was obtained and given to Dr Radford Pax for her review.  Per Dr Radford Pax the pt is advised to stop taking Amiodarone and that she needs to be seen in our A-fib clinic. The pt verbalized understanding and is in agreement with plan. Appt scheduled with the A-Fib clinic on Monday 09/16/15 at 9 am and the pt is in agreement with date and time of appt.

## 2015-09-16 ENCOUNTER — Ambulatory Visit (HOSPITAL_COMMUNITY)
Admission: RE | Admit: 2015-09-16 | Discharge: 2015-09-16 | Disposition: A | Discharge status: Home / Self Care | Payer: Medicare Other | Source: Ambulatory Visit | Attending: Nurse Practitioner | Admitting: Nurse Practitioner

## 2015-09-16 ENCOUNTER — Encounter (HOSPITAL_COMMUNITY): Payer: Self-pay | Admitting: Nurse Practitioner

## 2015-09-16 VITALS — BP 118/70 | HR 51 | Ht 59.0 in | Wt 135.8 lb

## 2015-09-16 DIAGNOSIS — Z79899 Other long term (current) drug therapy: Secondary | ICD-10-CM | POA: Diagnosis not present

## 2015-09-16 DIAGNOSIS — E039 Hypothyroidism, unspecified: Secondary | ICD-10-CM | POA: Diagnosis not present

## 2015-09-16 DIAGNOSIS — K219 Gastro-esophageal reflux disease without esophagitis: Secondary | ICD-10-CM | POA: Insufficient documentation

## 2015-09-16 DIAGNOSIS — I481 Persistent atrial fibrillation: Secondary | ICD-10-CM | POA: Insufficient documentation

## 2015-09-16 DIAGNOSIS — Z85118 Personal history of other malignant neoplasm of bronchus and lung: Secondary | ICD-10-CM | POA: Insufficient documentation

## 2015-09-16 DIAGNOSIS — I110 Hypertensive heart disease with heart failure: Secondary | ICD-10-CM | POA: Diagnosis not present

## 2015-09-16 DIAGNOSIS — Z87891 Personal history of nicotine dependence: Secondary | ICD-10-CM | POA: Insufficient documentation

## 2015-09-16 DIAGNOSIS — I480 Paroxysmal atrial fibrillation: Secondary | ICD-10-CM | POA: Diagnosis not present

## 2015-09-16 DIAGNOSIS — I5032 Chronic diastolic (congestive) heart failure: Secondary | ICD-10-CM | POA: Diagnosis not present

## 2015-09-16 DIAGNOSIS — Z7901 Long term (current) use of anticoagulants: Secondary | ICD-10-CM | POA: Insufficient documentation

## 2015-09-16 NOTE — Progress Notes (Signed)
Hi Scott ,  Bmet and Mag level in pt's chart 09/10/15. Looks like went to Dr. Radford Pax maybe.  Arbie Cookey

## 2015-09-16 NOTE — Progress Notes (Signed)
Patient ID: Patricia [name], female   DOB: 10-04-1940, 75 y.o.   MRN: [id number]     Primary Care Physician: Annye Asa, MD Referring Physician:Dr. Amarrah Meinhart is a 75 y.o. female with a h/o Lung CA status post resection in 2003, breast CA status post lumpectomy, tamoxifen and radiation therapy, GERD, hypothyroidism, HTN, HL, diastolic CHF, mild to moderate AI, prior CVA and PAF. She underwent DCCV in 4/17. In follow-up, she was noted to be back in atrial fibrillation. She was placed on amiodarone. Amiodarone level was low in follow-up in her dose was adjusted. Last seen by Patricia [name] 09/09/15. Amiodarone dose had not been adjusted as previously recommended. Prior to seeing Patricia [name], her amiodarone was increased to 400 mg twice a day 1 week, then 300 mg daily. She returned for follow-up.  Since last seen, she developed volume excess. Her PCP increased her Lasix for several days. The patient notes return of symptoms of DOE, orthopnea, PND. She notes chest pressure with lying flat when she gets short of breath that resolves with sitting up. She denies exertional chest pain. She denies syncope. She she notes a non-productive cough.   Patricia Dopp, PA, increased lasix to 40 mg am and 20 mg PM, She does have CKD. Cardizem was decreased to 120 mg qd and Amiodsrone dose was kept at 200 mg a day but due to prolonged QTc of 599, on repeat EKG, 7/14, amiodarone was stopped.  Today, in the afib clinic, pt is in NSR. She feels as though her fluid status is stable. She has just been off amiodarone for a few days and probably still getting effects from the amiodarone. She was in SR 7/11 and 7/14 when evaluated. Pt c/o feeling heavy in her chest, fatigued and winded when in afib, but does not feel palpitations. She denies any significant alcohol, no caffeine, no significant snoring.  Today, she denies symptoms of palpitations, chest pain, shortness of breath, orthopnea, PND, lower extremity edema,  dizziness, presyncope, syncope, or neurologic sequela. The patient is tolerating medications without difficulties and is otherwise without complaint today.   Past Medical History  Diagnosis Date  . Cough variant asthma   . Osteopenia   . Stricture and stenosis of cervix   . Postmenopausal   . GERD (gastroesophageal reflux disease)   . Disease of pharynx or nasopharynx   . Benign essential HTN   . Allergic rhinitis, cause unspecified   . Diverticulosis   . Internal hemorrhoids   . Hiatal hernia   . Laryngospasm   . 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 Hyperlipidemia   . Glaucoma   . Laryngospasm   . Multinodular goiter   . Chronic cough   . 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 Radiation 10/22/14-11/23/14    Left Breast  . Chronic diastolic heart failure ([place]) 06/17/2015  . Aortic regurgitation 06/17/2015    Mild to moderate AR by echo 04/2015  . PAF (paroxysmal atrial fibrillation) (HCC)     CHADS2VASC score is 7 - on Apixaban.  S/P DCCV 06/2015   Past Surgical History  Procedure Laterality Date  . Lung cancer surgery  2003    resection carcinoid lingula-lt upper lobe  . Colonoscopy    . Radioactive seed guided mastectomy with axillary sentinel lymph node biopsy Left 06/26/2014    Procedure: RADIOACTIVE SEED GUIDED PARTIAL MASTECTOMY WITH AXILLARY SENTINEL LYMPH NODE BIOPSY;  Surgeon: Stark Klein, MD;  Location: [place];  Service: General;  Laterality: Left;  . Breast surgery    . Re-excision of breast lumpectomy Left 08/07/2014    Procedure: RE-EXCISION OF LEFT BREAST LUMPECTOMY;  Surgeon: Stark Klein, MD;  Location: [place];  Service: General;  Laterality: Left;  . Cardioversion N/A 06/24/2015    Procedure: CARDIOVERSION;  Surgeon: Sueanne Margarita, MD;  Location: [place] [facility];  Service: Cardiovascular;  Laterality: N/A;    Current Outpatient Prescriptions  Medication Sig Dispense Refill  . ADVAIR HFA 230-21 MCG/ACT inhaler  USE 2 INHALATIONS TWICE A DAY 36 g 1  . albuterol (PROVENTIL HFA;VENTOLIN HFA) 108 (90 Base) MCG/ACT inhaler Inhale 2 puffs into the lungs every 6 (six) hours as needed for wheezing or shortness of breath.    . anastrozole (ARIMIDEX) 1 MG tablet Take 1 tablet (1 mg total) by mouth daily. 90 tablet 3  . apixaban (ELIQUIS) 5 MG TABS tablet Take 1 tablet (5 mg total) by mouth 2 (two) times daily. 180 tablet 3  . atorvastatin (LIPITOR) 40 MG tablet Take 1 tablet (40 mg total) by mouth daily. 30 tablet 6  . bimatoprost (LUMIGAN) 0.03 % ophthalmic drops Place 1 drop into both eyes at bedtime.     Marland Kitchen diltiazem (CARDIZEM CD) 120 MG 24 hr capsule Take 1 capsule (120 mg total) by mouth daily. 30 capsule 3  . dorzolamide (TRUSOPT) 2 % ophthalmic solution Place 1 drop into both eyes 2 (two) times daily.    . furosemide (LASIX) 20 MG tablet Take 1 tablet (20 mg total) by mouth as directed. Take 2 tabs in the morning and 1 tab in the pm 270 tablet 3  . gabapentin (NEURONTIN) 400 MG capsule Take 400 mg by mouth 2 (two) times daily.    . metoprolol tartrate (LOPRESSOR) 25 MG tablet Take 1 tablet (25 mg total) by mouth 2 (two) times daily. 180 tablet 3  . Multiple Vitamins-Iron (MULTIVITAMIN/IRON) TABS Take 1 tablet by mouth daily.     Marland Kitchen omeprazole (PRILOSEC) 20 MG capsule Take 20 mg by mouth daily. TAKE 30 MINUTES BEFORE A MEAL    . thyroid (ARMOUR) 15 MG tablet Take 15 mg by mouth daily.    . traMADol (ULTRAM) 50 MG tablet Take 50 mg by mouth every 6 (six) hours as needed for moderate pain.    Marland Kitchen loratadine (CLARITIN) 10 MG tablet Take 10 mg by mouth daily as needed for allergies. Reported on 09/16/2015     No current facility-administered medications for this encounter.    Allergies  Allergen Reactions  . Combigan [Brimonidine Tartrate-Timolol] Itching    Eyes itch, reddened  . Sulfa Antibiotics Rash    Social History   Social History  . Marital Status: Married    Spouse Name: N/A  . Number of  Children: N/A  . Years of Education: N/A   Occupational History  . Not on file.   Social History Main Topics  . Smoking status: Former Smoker    Quit date: 06/15/1963  . Smokeless tobacco: Never Used  . Alcohol Use: 0.0 oz/week    0 Standard drinks or equivalent per week     Comment: occasional wine  . Drug Use: No  . Sexual Activity: Not on file   Other Topics Concern  . Not on file   Social History Narrative    Family History  Problem Relation Age of Onset  . Stroke Mother   . Hypertension Mother   . Hyperlipidemia  Mother   . Stroke Father   . Transient ischemic attack Father   . Hyperlipidemia Father   . Hypertension Father   . Heart attack Neg Hx   . Atrial fibrillation Son     ROS- All systems are reviewed and negative except as per the HPI above  Physical Exam: Filed Vitals:   09/16/15 0905  BP: 118/70  Pulse: 51  Height: '4\' 11"'$  (1.499 m)  Weight: 135 lb 12.8 oz (61.598 kg)    GEN- The patient is well appearing, alert and oriented x 3 today.   Head- normocephalic, atraumatic Eyes-  Sclera clear, conjunctiva pink Ears- hearing intact Oropharynx- clear Neck- supple, no JVP Lymph- no cervical lymphadenopathy Lungs- Clear to ausculation bilaterally, normal work of breathing Heart- Slow, regular rate and rhythm, no murmurs, rubs or gallops, PMI not laterally displaced GI- soft, NT, ND, + BS Extremities- no clubbing, cyanosis, or edema MS- no significant deformity or atrophy Skin- no rash or lesion Psych- euthymic mood, full affect Neuro- strength and sensation are intact  EKG- Sinus brady at 51 bpm, Pr int 178 ms, qrs int 74 ms, qtc 458 ms Epic records reviewed Creatinine  1.39, K+at 3.9, mag at 2.1 Echo-2/17-------------------------------------------------------------------- LV EF: 55% - 60%  ------------------------------------------------------------------- Indications: Chest pain  786.51.  ------------------------------------------------------------------- History: Risk factors: Paroxysmal A-Fib. Asthma. Pulmonary edema. Hypertension.  ------------------------------------------------------------------- Study Conclusions  - Left ventricle: The cavity size was normal. Systolic function was  normal. The estimated ejection fraction was in the range of 55%  to 60%. Wall motion was normal; there were no regional wall  motion abnormalities. The study is not technically sufficient to  allow evaluation of LV diastolic function. Doppler parameters are  consistent with high ventricular filling pressure. - Aortic valve: Transvalvular velocity was within the normal range.  There was no stenosis. There was mild to moderate regurgitation.  Regurgitation pressure half-time: 369 ms. - Mitral valve: Transvalvular velocity was within the normal range.  There was no evidence for stenosis. There was trivial  regurgitation. - Left atrium: The atrium was severely dilated. 49 mm - Right ventricle: The cavity size was normal. Wall thickness was  normal. Systolic function was normal. - Atrial septum: No defect or patent foramen ovale was identified  by color flow Doppler. - Tricuspid valve: There was mild regurgitation. - Inferior vena cava: The vessel was normal in size. The  respirophasic diameter changes were in the normal range (>= 50%),  consistent with normal central venous pressure.   Assessment and Plan: 1. Persistent afib Off amiodarone and maintaining SR but probably still under the effects of amiodarone Will get amiodarone level at next lab visit 7/21 Will have to discuss with Dr. Rayann Heman re next best step to keep in rhythm She may be an ablation candidate having failed one antiarrythmic due to prolonged QTc, but her left atrium is severly enlarged at 49 mm and am not sure if he thinks she would be best candidate. Changes to left atrium possibly  secondary to radiation from L breast cancer. Amiodarone level is pending in another week, but if considering tikosyn, will have to make sure amiodarone is out of her system. I also have some concerns re kidney function and the ability to get sufficient amount of tikosyn on board without prolonging qtc.  Continue apixaban  2. Diastolic HF Continue lasix 40 mg am and 20 mg pm Limit fluids Limit salt  3. HTN Stable   F/u in afib  Clinic in 3 weeks  Geroge Baseman Fusako Tanabe, [facility] [address] [place], [place] [address] [phone]

## 2015-09-17 ENCOUNTER — Ambulatory Visit (INDEPENDENT_AMBULATORY_CARE_PROVIDER_SITE_OTHER): Payer: Medicare Other | Admitting: Internal Medicine

## 2015-09-17 DIAGNOSIS — I480 Paroxysmal atrial fibrillation: Secondary | ICD-10-CM

## 2015-09-17 LAB — PULMONARY FUNCTION TEST
DL/VA % pred: 97 %
DL/VA: 3.81 ml/min/mmHg/L
DLCO COR % PRED: 69 %
DLCO cor: 11.32 ml/min/mmHg
DLCO unc % pred: 72 %
DLCO unc: 11.72 ml/min/mmHg
FEF 25-75 Post: 1.28 L/sec
FEF 25-75 Pre: 1.56 L/sec
FEF2575-%CHANGE-POST: -17 %
FEF2575-%PRED-POST: 96 %
FEF2575-%Pred-Pre: 117 %
FEV1-%CHANGE-POST: -2 %
FEV1-%PRED-POST: 79 %
FEV1-%Pred-Pre: 81 %
FEV1-POST: 1.25 L
FEV1-Pre: 1.28 L
FEV1FVC-%CHANGE-POST: 2 %
FEV1FVC-%Pred-Pre: 112 %
FEV6-%Change-Post: -4 %
FEV6-%Pred-Post: 72 %
FEV6-%Pred-Pre: 75 %
FEV6-PRE: 1.51 L
FEV6-Post: 1.45 L
FEV6FVC-%Change-Post: 0 %
FEV6FVC-%PRED-PRE: 105 %
FEV6FVC-%Pred-Post: 106 %
FVC-%CHANGE-POST: -4 %
FVC-%Pred-Post: 68 %
FVC-%Pred-Pre: 71 %
FVC-Post: 1.45 L
FVC-Pre: 1.52 L
POST FEV1/FVC RATIO: 86 %
PRE FEV6/FVC RATIO: 99 %
Post FEV6/FVC ratio: 100 %
Pre FEV1/FVC ratio: 85 %
RV % PRED: 84 %
RV: 1.67 L
TLC % pred: 83 %
TLC: 3.48 L

## 2015-09-17 NOTE — Progress Notes (Signed)
PFT done today. 

## 2015-09-18 ENCOUNTER — Other Ambulatory Visit (HOSPITAL_BASED_OUTPATIENT_CLINIC_OR_DEPARTMENT_OTHER): Payer: Medicare Other

## 2015-09-18 ENCOUNTER — Telehealth: Payer: Self-pay | Admitting: Oncology

## 2015-09-18 ENCOUNTER — Ambulatory Visit (HOSPITAL_BASED_OUTPATIENT_CLINIC_OR_DEPARTMENT_OTHER): Payer: Medicare Other | Admitting: Oncology

## 2015-09-18 VITALS — BP 109/62 | HR 58 | Temp 98.3°F | Resp 18 | Ht 59.0 in | Wt 136.5 lb

## 2015-09-18 DIAGNOSIS — Z170 Estrogen receptor positive status [ER+]: Secondary | ICD-10-CM | POA: Diagnosis not present

## 2015-09-18 DIAGNOSIS — C50412 Malignant neoplasm of upper-outer quadrant of left female breast: Secondary | ICD-10-CM

## 2015-09-18 DIAGNOSIS — M8580 Other specified disorders of bone density and structure, unspecified site: Secondary | ICD-10-CM

## 2015-09-18 DIAGNOSIS — Z8673 Personal history of transient ischemic attack (TIA), and cerebral infarction without residual deficits: Secondary | ICD-10-CM

## 2015-09-18 LAB — CBC WITH DIFFERENTIAL/PLATELET
BASO%: 0.8 % (ref 0.0–2.0)
BASOS ABS: 0 10*3/uL (ref 0.0–0.1)
EOS ABS: 0 10*3/uL (ref 0.0–0.5)
EOS%: 0.1 % (ref 0.0–7.0)
HEMATOCRIT: 43.5 % (ref 34.8–46.6)
HEMOGLOBIN: 14.5 g/dL (ref 11.6–15.9)
LYMPH#: 1.8 10*3/uL (ref 0.9–3.3)
LYMPH%: 28.8 % (ref 14.0–49.7)
MCH: 31.4 pg (ref 25.1–34.0)
MCHC: 33.3 g/dL (ref 31.5–36.0)
MCV: 94.5 fL (ref 79.5–101.0)
MONO#: 0.5 10*3/uL (ref 0.1–0.9)
MONO%: 8.9 % (ref 0.0–14.0)
NEUT#: 3.8 10*3/uL (ref 1.5–6.5)
NEUT%: 61.4 % (ref 38.4–76.8)
PLATELETS: 207 10*3/uL (ref 145–400)
RBC: 4.6 10*6/uL (ref 3.70–5.45)
RDW: 13 % (ref 11.2–14.5)
WBC: 6.1 10*3/uL (ref 3.9–10.3)

## 2015-09-18 LAB — COMPREHENSIVE METABOLIC PANEL
ALBUMIN: 3.6 g/dL (ref 3.5–5.0)
ALK PHOS: 75 U/L (ref 40–150)
ALT: 30 U/L (ref 0–55)
AST: 24 U/L (ref 5–34)
Anion Gap: 9 mEq/L (ref 3–11)
BUN: 21.6 mg/dL (ref 7.0–26.0)
CALCIUM: 8.9 mg/dL (ref 8.4–10.4)
CO2: 28 mEq/L (ref 22–29)
Chloride: 106 mEq/L (ref 98–109)
Creatinine: 1.4 mg/dL — ABNORMAL HIGH (ref 0.6–1.1)
EGFR: 37 mL/min/{1.73_m2} — AB (ref >90)
Glucose: 107 mg/dl (ref 70–140)
POTASSIUM: 3.9 meq/L (ref 3.5–5.1)
Sodium: 143 mEq/L (ref 136–145)
Total Bilirubin: 0.67 mg/dL (ref 0.20–1.20)
Total Protein: 6.7 g/dL (ref 6.4–8.3)

## 2015-09-18 NOTE — Progress Notes (Signed)
[place]  [phone] [phone]) [phone]   ID: [name] OB: [birth year]  MR#: [id number]  XBL#:[id number]  PCP: Annye Asa, MD GYN:   SU: Stark Klein OTHER MD: Thea Silversmith, Charlena Cross  CHIEF COMPLAINT: Estrogen receptor positive breast cancer  TREATMENT: Tamoxifen; denosumab/Prolia  BREAST CANCER HISTORY: From the initial intake note 06/14/2013:  [name] had screening mammography with tomography at [facility] 05/26/2013 showing some architectural distortion in the left breast superiorly. Ultrasound of the left breast 06/02/2013 showed a 2 cm irregular mass superiorly in the left breast, medial to the scar on the skin from a prior biopsy. This mass was palpable. 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  On 06/13/2013 the patient underwent bilateral breast MRI. This showed a 2.8 cm irregular enhancing mass in the superior left breast but no additional suspicious masses, and no abnormal adenopathy, and no findings in the right breast.  The patient's subsequent history is as detailed below.  INTERVAL HISTORY: Maryreturns today for followup of her  breast cancer. She resumed anastrozole in May and is tolerating it well so far. She has had no hot flashes and no worsening vaginal dryness. She did not experience the arthralgias and myalgias that so many patients have on this medication.  REVIEW OF SYSTEMS: Angelly is playing golf for fun--she would not tell me her scores--and that involves a great deal of walking. She is busy with her yard as well. She continues on apixaban, with no bleeding complications. A detailed review of systems today was entirely noncontributory  PAST MEDICAL HISTORY: Past Medical History  Diagnosis Date   . Cough variant asthma   . Osteopenia   . Stricture and stenosis of cervix   . Postmenopausal   . GERD (gastroesophageal reflux disease)   . Disease of pharynx or nasopharynx   . Benign essential HTN   . Allergic rhinitis, cause unspecified   . Diverticulosis   . Internal hemorrhoids   . Hiatal hernia   . Laryngospasm   . 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 Hyperlipidemia   . Glaucoma   . Laryngospasm   . Multinodular goiter   . Chronic cough   . 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 Radiation 10/22/14-11/23/14    Left Breast  . Chronic diastolic heart failure ([place]) 06/17/2015  . Aortic regurgitation 06/17/2015    Mild to moderate AR by echo 04/2015  . PAF (paroxysmal atrial fibrillation) ([facility])     CHADS2VASC score is 7 - on Apixaban.  S/P DCCV 06/2015    PAST SURGICAL HISTORY: Past Surgical History  Procedure Laterality Date  . Lung cancer surgery  2003    resection carcinoid lingula-lt upper lobe  . Colonoscopy    . Radioactive seed guided mastectomy with axillary sentinel lymph node biopsy Left 06/26/2014    Procedure: RADIOACTIVE SEED GUIDED PARTIAL MASTECTOMY WITH AXILLARY SENTINEL LYMPH NODE BIOPSY;  Surgeon: Stark Klein, MD;  Location: [place];  Service: General;  Laterality: Left;  . Breast surgery    . Re-excision of breast lumpectomy Left 08/07/2014    Procedure: RE-EXCISION OF LEFT BREAST LUMPECTOMY;  Surgeon: Stark Klein, MD;  Location: [place];  Service: General;  Laterality: Left;  . Cardioversion N/A 06/24/2015  Procedure: CARDIOVERSION;  Surgeon: Sueanne Margarita, MD;  Location: [facility];  Service: Cardiovascular;  Laterality: N/A;    FAMILY HISTORY Family History  Problem Relation Age of Onset  . Stroke Mother   . Hypertension Mother   . Hyperlipidemia Mother   . Stroke Father   . Transient ischemic attack Father   . Hyperlipidemia Father   . Hypertension Father   . Heart attack Neg Hx   .  Atrial fibrillation Son    the patient's father died from pneumonia the age of 24, in the setting of dementia. The patient's mother died at the age of 70 following a stroke. The patient had one brother, 4 sisters. There is no history of breast or ovarian cancer in the family  GYNECOLOGIC HISTORY:  Menarche age 82, first live birth age 17, she is [place] P2. She did not use hormone replacement at menopause. She never took birth control pills.   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 She does a little bit of swelling on the side. Her husband [name] is retired from the [employer] (Designer, fashion/clothing) and then worked for [employer]. He is an avid Animator. Son [name] lives in [place] and is an Art gallery manager. Daughter [name] lives in [place] and is a Astronomer. The patient has 5 grandchildren. She attends the [employer]    ADVANCED DIRECTIVES: In place   HEALTH MAINTENANCE: Social History  Substance Use Topics  . Smoking status: Former Smoker    Quit date: 06/15/1963  . Smokeless tobacco: Never Used  . Alcohol Use: 0.0 oz/week    0 Standard drinks or equivalent per week     Comment: occasional wine     Colonoscopy:  PAP:  Bone density: 07/12/2013 at [facility], T score of -1.8 at the femoral neck  Lipid panel:  Allergies  Allergen Reactions  . Combigan [Brimonidine Tartrate-Timolol] Itching    Eyes itch, reddened  . Sulfa Antibiotics Rash    Current Outpatient Prescriptions  Medication Sig Dispense Refill  . ADVAIR HFA 230-21 MCG/ACT inhaler USE 2 INHALATIONS TWICE A DAY 36 g 1  . albuterol (PROVENTIL HFA;VENTOLIN HFA) 108 (90 Base) MCG/ACT inhaler Inhale 2 puffs into the lungs every 6 (six) hours as needed for wheezing or shortness of breath.    . anastrozole (ARIMIDEX) 1 MG tablet Take 1 tablet (1 mg total) by mouth daily. 90 tablet 3  . apixaban (ELIQUIS) 5 MG TABS tablet Take 1 tablet (5 mg total) by mouth 2 (two) times daily. 180 tablet 3   . atorvastatin (LIPITOR) 40 MG tablet Take 1 tablet (40 mg total) by mouth daily. 30 tablet 6  . bimatoprost (LUMIGAN) 0.03 % ophthalmic drops Place 1 drop into both eyes at bedtime.     Marland Kitchen diltiazem (CARDIZEM CD) 120 MG 24 hr capsule Take 1 capsule (120 mg total) by mouth daily. 30 capsule 3  . dorzolamide (TRUSOPT) 2 % ophthalmic solution Place 1 drop into both eyes 2 (two) times daily.    . furosemide (LASIX) 20 MG tablet Take 1 tablet (20 mg total) by mouth as directed. Take 2 tabs in the morning and 1 tab in the pm 270 tablet 3  . gabapentin (NEURONTIN) 400 MG capsule Take 400 mg by mouth 2 (two) times daily.    Marland Kitchen loratadine (CLARITIN) 10 MG tablet Take 10 mg by mouth daily as needed for allergies. Reported on 09/16/2015    . metoprolol tartrate (LOPRESSOR) 25 MG tablet Take  1 tablet (25 mg total) by mouth 2 (two) times daily. 180 tablet 3  . Multiple Vitamins-Iron (MULTIVITAMIN/IRON) TABS Take 1 tablet by mouth daily.     Marland Kitchen omeprazole (PRILOSEC) 20 MG capsule Take 20 mg by mouth daily. TAKE 30 MINUTES BEFORE A MEAL    . thyroid (ARMOUR) 15 MG tablet Take 15 mg by mouth daily.    . traMADol (ULTRAM) 50 MG tablet Take 50 mg by mouth every 6 (six) hours as needed for moderate pain.     No current facility-administered medications for this visit.    OBJECTIVE: Middle-aged white woman In no acute distress  Filed Vitals:   09/18/15 1510  BP: 109/62  Pulse: 58  Temp: 98.3 F (36.8 C)  Resp: 18     Body mass index is 27.55 kg/(m^2).    ECOG FS:0 - Asymptomatic  Sclerae unicteric, pupils round and equal Oropharynx clear and moist-- no thrush or other lesions No cervical or supraclavicular adenopathy Lungs no rales or rhonchi Heart regular rate and rhythm Abd soft, nontender, positive bowel sounds MSK no focal spinal tenderness, no upper extremity lymphedema Neuro: nonfocal, well oriented, appropriate affect Breasts: The right breast is unremarkable. The left breast is status post  lumpectomy followed by radiation. There is no evidence of local recurrence. Axilla is benign.    LAB RESULTS:  CMP     Component Value Date/Time   NA 143 09/18/2015 1428   NA 140 09/09/2015 1031   K 3.9 09/18/2015 1428   K 3.9 09/09/2015 1031   CL 103 09/09/2015 1031   CO2 28 09/18/2015 1428   CO2 28 09/09/2015 1031   GLUCOSE 107 09/18/2015 1428   GLUCOSE 104* 09/09/2015 1031   GLUCOSE 88 01/13/2006 1012   BUN 21.6 09/18/2015 1428   BUN 23 09/09/2015 1031   CREATININE 1.4* 09/18/2015 1428   CREATININE 1.39* 09/09/2015 1031   CREATININE 1.38* 08/30/2015 1139   CALCIUM 8.9 09/18/2015 1428   CALCIUM 8.3* 09/09/2015 1031   PROT 6.7 09/18/2015 1428   PROT 5.8* 09/09/2015 1031   ALBUMIN 3.6 09/18/2015 1428   ALBUMIN 3.7 09/09/2015 1031   AST 24 09/18/2015 1428   AST 18 09/09/2015 1031   ALT 30 09/18/2015 1428   ALT 28 09/09/2015 1031   ALKPHOS 75 09/18/2015 1428   ALKPHOS 68 09/09/2015 1031   BILITOT 0.67 09/18/2015 1428   BILITOT 0.6 09/09/2015 1031   GFRNONAA 38* 08/25/2015 0104   GFRAA 44* 08/25/2015 0104    I No results found for: SPEP  Lab Results  Component Value Date   WBC 6.1 09/18/2015   NEUTROABS 3.8 09/18/2015   HGB 14.5 09/18/2015   HCT 43.5 09/18/2015   MCV 94.5 09/18/2015   PLT 207 09/18/2015      Chemistry      Component Value Date/Time   NA 143 09/18/2015 1428   NA 140 09/09/2015 1031   K 3.9 09/18/2015 1428   K 3.9 09/09/2015 1031   CL 103 09/09/2015 1031   CO2 28 09/18/2015 1428   CO2 28 09/09/2015 1031   BUN 21.6 09/18/2015 1428   BUN 23 09/09/2015 1031   CREATININE 1.4* 09/18/2015 1428   CREATININE 1.39* 09/09/2015 1031   CREATININE 1.38* 08/30/2015 1139      Component Value Date/Time   CALCIUM 8.9 09/18/2015 1428   CALCIUM 8.3* 09/09/2015 1031   ALKPHOS 75 09/18/2015 1428   ALKPHOS 68 09/09/2015 1031   AST 24 09/18/2015 1428   AST 18  09/09/2015 1031   ALT 30 09/18/2015 1428   ALT 28 09/09/2015 1031   BILITOT 0.67  09/18/2015 1428   BILITOT 0.6 09/09/2015 1031       No results found for: LABCA2  No components found for: LABCA125  No results for input(s): INR in the last 168 hours.  Urinalysis    Component Value Date/Time   COLORURINE YELLOW 04/28/2015 1114   APPEARANCEUR CLEAR 04/28/2015 1114   LABSPEC 1.010 04/28/2015 1114   PHURINE 6.5 04/28/2015 1114   GLUCOSEU NEGATIVE 04/28/2015 1114   HGBUR TRACE* 04/28/2015 1114   HGBUR negative 05/20/2007 0942   BILIRUBINUR NEGATIVE 04/28/2015 1114   KETONESUR NEGATIVE 04/28/2015 1114   PROTEINUR NEGATIVE 04/28/2015 1114   UROBILINOGEN 0.2 05/20/2007 0942   NITRITE NEGATIVE 04/28/2015 1114   LEUKOCYTESUR NEGATIVE 04/28/2015 1114    STUDIES: Dg Chest 2 View  08/25/2015  CLINICAL DATA:  Acute onset of cough and shortness of breath. Atrial fibrillation. Initial encounter. EXAM: CHEST  2 VIEW COMPARISON:  Chest radiograph performed 05/09/2015 FINDINGS: The lungs are well-aerated. Vascular congestion is noted. Increased interstitial markings raise concern for pulmonary edema. A small left pleural effusion is noted. There is no evidence of pneumothorax. The heart is borderline normal in size. No acute osseous abnormalities are seen. Postoperative change is noted overlying the left chest wall. IMPRESSION: Vascular congestion noted. Increased interstitial markings raise concern for pulmonary edema. Small left pleural effusion noted. Electronically Signed   By: Garald Balding M.D.   On: 08/25/2015 01:39    ASSESSMENT: 75 y.o. [place] woman status post left breast upper outer quadrant biopsy 08/19/2013 for a clinical T2 N0, stage IIA invasive lobular breast cancer, estrogen receptor positive, progesterone receptor negative, with an MIB-1 of 20%. There was not sufficient tissue for HER-2 testing.  (1) neoadjuvant anastrozole started mid April 2015, discontinued early June 2015 with poor tolerance  (2) tamoxifen started first week in July 2015.  Discontinued 06/20/15 after stroke.  (a) anastrozole resumed  07/01/2015   (3) osteopenia with a T score of -1.8 at the femoral necks 07/12/2013  (a) bone density at [facility]  07/09/2015 shows T score of -2.0  (b) Prolia started 09/24/2015  (4) left lumpectomy 06/26/2014 showed a pT1c pN0, stage IA invasive lobular carcinoma, grade 1, HER-2 negative, with positive margins   (a) additional surgery 08/07/2014 did not completely clear margins, but no further surgery was advised  (5) radiation completed 11/22/2014   PLAN: Madilynne is now a little over a year out from definitive surgery for her breast cancer with no evidence of disease recurrence. This is very favorable.  She is tolerating the anastrozole excellently and obtaining it at a good price. The plan will be to continue that until July 2020  The one concern with this drug is worsening osteopenia. She already has a T score of -2.0. Today we discussed the multiple possible treatments including bisphosphonates, Forteo, and denosumab. Because denosumab is non-nephrotoxic and because it is very convenient to receive, she decided for that option. She has a good understanding of the possible toxicities, side effects and complications including the rare possibility of osteonecrosis of the jaw. She will let her dentist know that she is on this drug. Also she will take additional calcium on the day of Prolia and the following day.  Incidentally she is behind on her mammography and I have gone ahead and placed that order through [facility].  She will receive her first Prolia dose next week, and then a  second dose in 6 months. She will return to see me a year from now. She will see Dr. Barry Dienes in approximately 6 months.  [place] knows to call for any problems that may develop before her next visit.   Chauncey Cruel, MD   09/18/2015 7:45 PM

## 2015-09-18 NOTE — Telephone Encounter (Signed)
appts made and avs printed.mammogram sch for [facility] has been done in May of 2017

## 2015-09-19 ENCOUNTER — Telehealth: Payer: Self-pay | Admitting: Cardiology

## 2015-09-19 ENCOUNTER — Other Ambulatory Visit: Payer: Self-pay

## 2015-09-19 DIAGNOSIS — R942 Abnormal results of pulmonary function studies: Secondary | ICD-10-CM

## 2015-09-19 NOTE — Telephone Encounter (Signed)
-----   Message from Sueanne Margarita, MD sent at 09/18/2015  9:46 AM EDT ----- PFTs with pulmonary vascular process with reduced DLCO - refer to Dr. Chase Caller

## 2015-09-19 NOTE — Telephone Encounter (Signed)
Follow-up ° ° ° ° °The pt is returning the nurses call °

## 2015-09-19 NOTE — Telephone Encounter (Signed)
Informed patient of results and verbal understanding expressed.   Pulmonary referral placed for scheduling. Patient agrees with treatment plan. 

## 2015-09-20 ENCOUNTER — Other Ambulatory Visit (INDEPENDENT_AMBULATORY_CARE_PROVIDER_SITE_OTHER): Payer: Medicare Other | Admitting: *Deleted

## 2015-09-20 DIAGNOSIS — I5033 Acute on chronic diastolic (congestive) heart failure: Secondary | ICD-10-CM

## 2015-09-20 DIAGNOSIS — N183 Chronic kidney disease, stage 3 (moderate): Secondary | ICD-10-CM

## 2015-09-20 DIAGNOSIS — I480 Paroxysmal atrial fibrillation: Secondary | ICD-10-CM | POA: Diagnosis not present

## 2015-09-20 LAB — BASIC METABOLIC PANEL
BUN: 28 mg/dL — AB (ref 7–25)
CALCIUM: 8.5 mg/dL — AB (ref 8.6–10.4)
CO2: 27 mmol/L (ref 20–31)
Chloride: 104 mmol/L (ref 98–110)
Creat: 1.25 mg/dL — ABNORMAL HIGH (ref 0.60–0.93)
GLUCOSE: 92 mg/dL (ref 65–99)
Potassium: 3.8 mmol/L (ref 3.5–5.3)
SODIUM: 141 mmol/L (ref 135–146)

## 2015-09-20 NOTE — Addendum Note (Signed)
Addended by: Eulis Foster on: 09/20/2015 09:29 AM   Modules accepted: Orders

## 2015-09-23 ENCOUNTER — Other Ambulatory Visit: Payer: Self-pay | Admitting: *Deleted

## 2015-09-23 DIAGNOSIS — R030 Elevated blood-pressure reading, without diagnosis of hypertension: Secondary | ICD-10-CM

## 2015-09-24 ENCOUNTER — Other Ambulatory Visit (HOSPITAL_BASED_OUTPATIENT_CLINIC_OR_DEPARTMENT_OTHER): Payer: Medicare Other

## 2015-09-24 ENCOUNTER — Ambulatory Visit (HOSPITAL_BASED_OUTPATIENT_CLINIC_OR_DEPARTMENT_OTHER): Payer: Medicare Other

## 2015-09-24 VITALS — BP 120/65 | HR 54 | Temp 98.2°F | Resp 20

## 2015-09-24 DIAGNOSIS — M8580 Other specified disorders of bone density and structure, unspecified site: Secondary | ICD-10-CM

## 2015-09-24 DIAGNOSIS — C50412 Malignant neoplasm of upper-outer quadrant of left female breast: Secondary | ICD-10-CM

## 2015-09-24 DIAGNOSIS — Z853 Personal history of malignant neoplasm of breast: Secondary | ICD-10-CM | POA: Diagnosis present

## 2015-09-24 LAB — CBC WITH DIFFERENTIAL/PLATELET
BASO%: 0.6 % (ref 0.0–2.0)
Basophils Absolute: 0 10e3/uL (ref 0.0–0.1)
EOS%: 0.1 % (ref 0.0–7.0)
Eosinophils Absolute: 0 10e3/uL (ref 0.0–0.5)
HCT: 45.4 % (ref 34.8–46.6)
HGB: 15 g/dL (ref 11.6–15.9)
LYMPH%: 26.1 % (ref 14.0–49.7)
MCH: 31.2 pg (ref 25.1–34.0)
MCHC: 33 g/dL (ref 31.5–36.0)
MCV: 94.3 fL (ref 79.5–101.0)
MONO#: 0.6 10e3/uL (ref 0.1–0.9)
MONO%: 9.6 % (ref 0.0–14.0)
NEUT#: 4.2 10e3/uL (ref 1.5–6.5)
NEUT%: 63.6 % (ref 38.4–76.8)
Platelets: 218 10e3/uL (ref 145–400)
RBC: 4.82 10e6/uL (ref 3.70–5.45)
RDW: 13.5 % (ref 11.2–14.5)
WBC: 6.6 10e3/uL (ref 3.9–10.3)
lymph#: 1.7 10e3/uL (ref 0.9–3.3)

## 2015-09-24 LAB — COMPREHENSIVE METABOLIC PANEL
ALBUMIN: 3.7 g/dL (ref 3.5–5.0)
ALK PHOS: 71 U/L (ref 40–150)
ALT: 27 U/L (ref 0–55)
AST: 21 U/L (ref 5–34)
Anion Gap: 9 mEq/L (ref 3–11)
BILIRUBIN TOTAL: 0.65 mg/dL (ref 0.20–1.20)
BUN: 26.4 mg/dL — AB (ref 7.0–26.0)
CALCIUM: 9.2 mg/dL (ref 8.4–10.4)
CO2: 28 mEq/L (ref 22–29)
Chloride: 105 mEq/L (ref 98–109)
Creatinine: 1.3 mg/dL — ABNORMAL HIGH (ref 0.6–1.1)
EGFR: 39 mL/min/{1.73_m2} — AB (ref >90)
GLUCOSE: 89 mg/dL (ref 70–140)
Potassium: 4 mEq/L (ref 3.5–5.1)
SODIUM: 143 meq/L (ref 136–145)
TOTAL PROTEIN: 6.9 g/dL (ref 6.4–8.3)

## 2015-09-24 MED ORDER — DENOSUMAB 60 MG/ML ~~LOC~~ SOLN
60.0000 mg | Freq: Once | SUBCUTANEOUS | Status: AC
  Administered 2015-09-24: 60 mg via SUBCUTANEOUS
  Filled 2015-09-24: qty 1

## 2015-09-24 NOTE — Patient Instructions (Signed)
Denosumab injection  What is this medicine?  DENOSUMAB (den oh sue mab) slows bone breakdown. Prolia is used to treat osteoporosis in women after menopause and in men. Xgeva is used to prevent bone fractures and other bone problems caused by cancer bone metastases. Xgeva is also used to treat giant cell tumor of the bone.  This medicine may be used for other purposes; ask your health care provider or pharmacist if you have questions.  What should I tell my health care provider before I take this medicine?  They need to know if you have any of these conditions:  -dental disease  -eczema  -infection or history of infections  -kidney disease or on dialysis  -low blood calcium or vitamin D  -malabsorption syndrome  -scheduled to have surgery or tooth extraction  -taking medicine that contains denosumab  -thyroid or parathyroid disease  -an unusual reaction to denosumab, other medicines, foods, dyes, or preservatives  -pregnant or trying to get pregnant  -breast-feeding  How should I use this medicine?  This medicine is for injection under the skin. It is given by a health care professional in a hospital or clinic setting.  If you are getting Prolia, a special MedGuide will be given to you by the pharmacist with each prescription and refill. Be sure to read this information carefully each time.  For Prolia, talk to your pediatrician regarding the use of this medicine in children. Special care may be needed. For Xgeva, talk to your pediatrician regarding the use of this medicine in children. While this drug may be prescribed for children as young as 13 years for selected conditions, precautions do apply.  Overdosage: If you think you have taken too much of this medicine contact a poison control center or emergency room at once.  NOTE: This medicine is only for you. Do not share this medicine with others.  What if I miss a dose?  It is important not to miss your dose. Call your doctor or health care professional if you are  unable to keep an appointment.  What may interact with this medicine?  Do not take this medicine with any of the following medications:  -other medicines containing denosumab  This medicine may also interact with the following medications:  -medicines that suppress the immune system  -medicines that treat cancer  -steroid medicines like prednisone or cortisone  This list may not describe all possible interactions. Give your health care provider a list of all the medicines, herbs, non-prescription drugs, or dietary supplements you use. Also tell them if you smoke, drink alcohol, or use illegal drugs. Some items may interact with your medicine.  What should I watch for while using this medicine?  Visit your doctor or health care professional for regular checks on your progress. Your doctor or health care professional may order blood tests and other tests to see how you are doing.  Call your doctor or health care professional if you get a cold or other infection while receiving this medicine. Do not treat yourself. This medicine may decrease your body's ability to fight infection.  You should make sure you get enough calcium and vitamin D while you are taking this medicine, unless your doctor tells you not to. Discuss the foods you eat and the vitamins you take with your health care professional.  See your dentist regularly. Brush and floss your teeth as directed. Before you have any dental work done, tell your dentist you are receiving this medicine.  Do   not become pregnant while taking this medicine or for 5 months after stopping it. Women should inform their doctor if they wish to become pregnant or think they might be pregnant. There is a potential for serious side effects to an unborn child. Talk to your health care professional or pharmacist for more information.  What side effects may I notice from receiving this medicine?  Side effects that you should report to your doctor or health care professional as soon as  possible:  -allergic reactions like skin rash, itching or hives, swelling of the face, lips, or tongue  -breathing problems  -chest pain  -fast, irregular heartbeat  -feeling faint or lightheaded, falls  -fever, chills, or any other sign of infection  -muscle spasms, tightening, or twitches  -numbness or tingling  -skin blisters or bumps, or is dry, peels, or red  -slow healing or unexplained pain in the mouth or jaw  -unusual bleeding or bruising  Side effects that usually do not require medical attention (Report these to your doctor or health care professional if they continue or are bothersome.):  -muscle pain  -stomach upset, gas  This list may not describe all possible side effects. Call your doctor for medical advice about side effects. You may report side effects to FDA at 1-800-FDA-1088.  Where should I keep my medicine?  This medicine is only given in a clinic, doctor's office, or other health care setting and will not be stored at home.  NOTE: This sheet is a summary. It may not cover all possible information. If you have questions about this medicine, talk to your doctor, pharmacist, or health care provider.      2016, Elsevier/Gold Standard. (2011-08-17 12:37:47)

## 2015-09-25 LAB — AMIODARONE LEVEL
Amiodarone Lvl: 0.4 ug/mL — ABNORMAL LOW (ref 1.5–2.5)
Desethylamiodarone: 0.7 ug/mL — ABNORMAL LOW (ref 1.5–2.5)

## 2015-10-03 ENCOUNTER — Ambulatory Visit: Payer: Self-pay | Admitting: Physician Assistant

## 2015-10-07 ENCOUNTER — Other Ambulatory Visit (INDEPENDENT_AMBULATORY_CARE_PROVIDER_SITE_OTHER): Payer: Medicare Other

## 2015-10-07 DIAGNOSIS — R030 Elevated blood-pressure reading, without diagnosis of hypertension: Secondary | ICD-10-CM

## 2015-10-07 LAB — BASIC METABOLIC PANEL
BUN: 21 mg/dL (ref 7–25)
CHLORIDE: 105 mmol/L (ref 98–110)
CO2: 27 mmol/L (ref 20–31)
Calcium: 8.4 mg/dL — ABNORMAL LOW (ref 8.6–10.4)
Creat: 1.16 mg/dL — ABNORMAL HIGH (ref 0.60–0.93)
Glucose, Bld: 88 mg/dL (ref 65–99)
POTASSIUM: 3.7 mmol/L (ref 3.5–5.3)
SODIUM: 139 mmol/L (ref 135–146)

## 2015-10-08 ENCOUNTER — Telehealth: Payer: Self-pay | Admitting: *Deleted

## 2015-10-08 NOTE — Telephone Encounter (Signed)
Pt notified of lab results by phone with verbal understanding.  

## 2015-10-11 ENCOUNTER — Encounter (HOSPITAL_COMMUNITY): Payer: Self-pay | Admitting: Nurse Practitioner

## 2015-10-11 ENCOUNTER — Ambulatory Visit (HOSPITAL_COMMUNITY)
Admission: RE | Admit: 2015-10-11 | Discharge: 2015-10-11 | Disposition: A | Discharge status: Home / Self Care | Payer: Medicare Other | Source: Ambulatory Visit | Attending: Nurse Practitioner | Admitting: Nurse Practitioner

## 2015-10-11 VITALS — BP 122/66 | HR 52 | Ht 59.0 in | Wt 134.8 lb

## 2015-10-11 DIAGNOSIS — Z8249 Family history of ischemic heart disease and other diseases of the circulatory system: Secondary | ICD-10-CM | POA: Insufficient documentation

## 2015-10-11 DIAGNOSIS — Z8673 Personal history of transient ischemic attack (TIA), and cerebral infarction without residual deficits: Secondary | ICD-10-CM | POA: Insufficient documentation

## 2015-10-11 DIAGNOSIS — Z923 Personal history of irradiation: Secondary | ICD-10-CM | POA: Insufficient documentation

## 2015-10-11 DIAGNOSIS — I481 Persistent atrial fibrillation: Secondary | ICD-10-CM | POA: Diagnosis not present

## 2015-10-11 DIAGNOSIS — Z79811 Long term (current) use of aromatase inhibitors: Secondary | ICD-10-CM | POA: Diagnosis not present

## 2015-10-11 DIAGNOSIS — E785 Hyperlipidemia, unspecified: Secondary | ICD-10-CM | POA: Insufficient documentation

## 2015-10-11 DIAGNOSIS — Z823 Family history of stroke: Secondary | ICD-10-CM | POA: Diagnosis not present

## 2015-10-11 DIAGNOSIS — Z87891 Personal history of nicotine dependence: Secondary | ICD-10-CM | POA: Insufficient documentation

## 2015-10-11 DIAGNOSIS — I5022 Chronic systolic (congestive) heart failure: Secondary | ICD-10-CM | POA: Insufficient documentation

## 2015-10-11 DIAGNOSIS — Z79899 Other long term (current) drug therapy: Secondary | ICD-10-CM | POA: Diagnosis not present

## 2015-10-11 DIAGNOSIS — E039 Hypothyroidism, unspecified: Secondary | ICD-10-CM | POA: Diagnosis not present

## 2015-10-11 DIAGNOSIS — Z7901 Long term (current) use of anticoagulants: Secondary | ICD-10-CM | POA: Diagnosis not present

## 2015-10-11 DIAGNOSIS — I130 Hypertensive heart and chronic kidney disease with heart failure and stage 1 through stage 4 chronic kidney disease, or unspecified chronic kidney disease: Secondary | ICD-10-CM | POA: Diagnosis not present

## 2015-10-11 DIAGNOSIS — Z85118 Personal history of other malignant neoplasm of bronchus and lung: Secondary | ICD-10-CM | POA: Insufficient documentation

## 2015-10-11 DIAGNOSIS — Z853 Personal history of malignant neoplasm of breast: Secondary | ICD-10-CM | POA: Insufficient documentation

## 2015-10-11 DIAGNOSIS — I351 Nonrheumatic aortic (valve) insufficiency: Secondary | ICD-10-CM | POA: Diagnosis not present

## 2015-10-11 DIAGNOSIS — Z888 Allergy status to other drugs, medicaments and biological substances status: Secondary | ICD-10-CM | POA: Insufficient documentation

## 2015-10-11 DIAGNOSIS — I4891 Unspecified atrial fibrillation: Secondary | ICD-10-CM | POA: Diagnosis present

## 2015-10-11 DIAGNOSIS — Z882 Allergy status to sulfonamides status: Secondary | ICD-10-CM | POA: Diagnosis not present

## 2015-10-11 DIAGNOSIS — K219 Gastro-esophageal reflux disease without esophagitis: Secondary | ICD-10-CM | POA: Diagnosis not present

## 2015-10-11 DIAGNOSIS — I480 Paroxysmal atrial fibrillation: Secondary | ICD-10-CM

## 2015-10-11 DIAGNOSIS — M8580 Other specified disorders of bone density and structure, unspecified site: Secondary | ICD-10-CM | POA: Diagnosis not present

## 2015-10-11 DIAGNOSIS — N189 Chronic kidney disease, unspecified: Secondary | ICD-10-CM | POA: Diagnosis not present

## 2015-10-11 NOTE — Progress Notes (Addendum)
Patient ID: Patricia [name], female   DOB: 01/18/1941, 75 y.o.   MRN: [id number]     Primary Care Physician: Annye Asa, MD Referring Physician:Dr. Leeya Rusconi is a 75 y.o. female with a h/o Lung CA status post resection in 2003, breast CA status post lumpectomy, tamoxifen and radiation therapy, GERD, hypothyroidism, HTN, HL, diastolic CHF, mild to moderate AI, prior CVA and PAF. She underwent DCCV in 4/17. In follow-up, she was noted to be back in atrial fibrillation. She was placed on amiodarone. Amiodarone level was low in follow-up in her dose was adjusted. Last seen by Richardson Dopp 09/09/15. Amiodarone dose had not been adjusted as previously recommended. Prior to seeing Nicki Reaper, her amiodarone was increased to 400 mg twice a day 1 week, then 300 mg daily. She returned for follow-up.  Since last seen, she developed volume excess. Her PCP increased her Lasix for several days. The patient notes return of symptoms of DOE, orthopnea, PND. She notes chest pressure with lying flat when she gets short of breath that resolves with sitting up. She denies exertional chest pain. She denies syncope. She  notes a non-productive cough.   Richardson Dopp, PA, increased lasix to 40 mg am and 20 mg PM, She does have CKD. Cardizem was decreased to 120 mg qd and Amiodarone dose was kept at 200 mg a day but due to prolonged QTc of 599, on repeat EKG, 7/14, amiodarone was stopped.  Today, in the afib clinic, pt is in NSR. She feels as though her fluid status is stable. She has just been off amiodarone for a few days and probably still getting effects from the amiodarone. She was in SR 7/11 and 7/14 when evaluated. Pt c/o feeling heavy in her chest, fatigued and winded when in afib, but does not feel palpitations. She denies any significant alcohol, no caffeine, no significant snoring.  F/u afib clinic, 8/11, she is doing well. No further afib. Fluid status is normal. Amiodarone level was checked and low. But  pt prefers as long as afib is quiescent, she prefers to stay off AAD's. However, if she notices any increase in afib, she will be back in touch.  Today, she denies symptoms of palpitations, chest pain, shortness of breath, orthopnea, PND, lower extremity edema, dizziness, presyncope, syncope, or neurologic sequela. The patient is tolerating medications without difficulties and is otherwise without complaint today.   Past Medical History:  Diagnosis Date  . Allergic rhinitis, cause unspecified   . Aortic regurgitation 06/17/2015   Mild to moderate AR by echo 04/2015  . 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 Benign essential HTN   . 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 Chronic cough   . Chronic diastolic heart failure ([place]) 06/17/2015  . Cough variant asthma   . Disease of pharynx or nasopharynx   . Diverticulosis   . GERD (gastroesophageal reflux disease)   . Glaucoma   . Hiatal hernia   . Hyperlipidemia   . Internal hemorrhoids   . Laryngospasm   . Laryngospasm   . Multinodular goiter   . Osteopenia   . PAF (paroxysmal atrial fibrillation) ([facility])    CHADS2VASC score is 7 - on Apixaban.  S/P DCCV 06/2015  . Postmenopausal   . Radiation 10/22/14-11/23/14   Left Breast  . Stricture and stenosis of cervix    Past Surgical History:  Procedure Laterality Date  . BREAST SURGERY    .  CARDIOVERSION N/A 06/24/2015   Procedure: CARDIOVERSION;  Surgeon: Sueanne Margarita, MD;  [facility];  Service: Cardiovascular;  Laterality: N/A;  . COLONOSCOPY    . LUNG CANCER SURGERY  2003   resection carcinoid lingula-lt upper lobe  . RADIOACTIVE SEED GUIDED MASTECTOMY WITH AXILLARY SENTINEL LYMPH NODE BIOPSY Left 06/26/2014   Procedure: RADIOACTIVE SEED GUIDED PARTIAL MASTECTOMY WITH AXILLARY SENTINEL LYMPH NODE BIOPSY;  Surgeon: Stark Klein, MD;  Location: [place];  Service: General;  Laterality: Left;  . RE-EXCISION OF BREAST LUMPECTOMY Left 08/07/2014    Procedure: RE-EXCISION OF LEFT BREAST LUMPECTOMY;  Surgeon: Stark Klein, MD;  Location: [place];  Service: General;  Laterality: Left;    Current Outpatient Prescriptions  Medication Sig Dispense Refill  . ADVAIR HFA 230-21 MCG/ACT inhaler USE 2 INHALATIONS TWICE A DAY 36 g 1  . albuterol (PROVENTIL HFA;VENTOLIN HFA) 108 (90 Base) MCG/ACT inhaler Inhale 2 puffs into the lungs every 6 (six) hours as needed for wheezing or shortness of breath.    . anastrozole (ARIMIDEX) 1 MG tablet Take 1 tablet (1 mg total) by mouth daily. 90 tablet 3  . apixaban (ELIQUIS) 5 MG TABS tablet Take 1 tablet (5 mg total) by mouth 2 (two) times daily. 180 tablet 3  . atorvastatin (LIPITOR) 40 MG tablet Take 1 tablet (40 mg total) by mouth daily. 30 tablet 6  . bimatoprost (LUMIGAN) 0.03 % ophthalmic drops Place 1 drop into both eyes at bedtime.     Marland Kitchen diltiazem (CARDIZEM CD) 120 MG 24 hr capsule Take 1 capsule (120 mg total) by mouth daily. 30 capsule 3  . dorzolamide (TRUSOPT) 2 % ophthalmic solution Place 1 drop into both eyes 2 (two) times daily.    . furosemide (LASIX) 20 MG tablet Take 1 tablet (20 mg total) by mouth as directed. Take 2 tabs in the morning and 1 tab in the pm 270 tablet 3  . gabapentin (NEURONTIN) 400 MG capsule Take 400 mg by mouth 2 (two) times daily.    Marland Kitchen loratadine (CLARITIN) 10 MG tablet Take 10 mg by mouth daily as needed for allergies. Reported on 09/16/2015    . metoprolol tartrate (LOPRESSOR) 25 MG tablet Take 1 tablet (25 mg total) by mouth 2 (two) times daily. 180 tablet 3  . Multiple Vitamins-Iron (MULTIVITAMIN/IRON) TABS Take 1 tablet by mouth daily.     Marland Kitchen omeprazole (PRILOSEC) 20 MG capsule Take 20 mg by mouth daily. TAKE 30 MINUTES BEFORE A MEAL    . thyroid (ARMOUR) 15 MG tablet Take 15 mg by mouth daily.    . traMADol (ULTRAM) 50 MG tablet Take 50 mg by mouth every 6 (six) hours as needed for moderate pain.     No current facility-administered medications for  this encounter.     Allergies  Allergen Reactions  . Combigan [Brimonidine Tartrate-Timolol] Itching    Eyes itch, reddened  . Sulfa Antibiotics Rash    Social History   Social History  . Marital status: Married    Spouse name: N/A  . Number of children: N/A  . Years of education: N/A   Occupational History  . Not on file.   Social History Main Topics  . Smoking status: Former Smoker    Quit date: 06/15/1963  . Smokeless tobacco: Never Used  . Alcohol use 0.0 oz/week     Comment: occasional wine  . Drug use: No  . Sexual activity: Not on file   Other Topics Concern  .  Not on file   Social History Narrative  . No narrative on file    Family History  Problem Relation Age of Onset  . Stroke Mother   . Hypertension Mother   . Hyperlipidemia Mother   . Stroke Father   . Transient ischemic attack Father   . Hyperlipidemia Father   . Hypertension Father   . Heart attack Neg Hx   . Atrial fibrillation Son     ROS- All systems are reviewed and negative except as per the HPI above  Physical Exam: Vitals:   10/11/15 0933  BP: 122/66  Pulse: (!) 52  Weight: 134 lb 12.8 oz (61.1 kg)  Height: '4\' 11"'$  (1.499 m)    GEN- The patient is well appearing, alert and oriented x 3 today.   Head- normocephalic, atraumatic Eyes-  Sclera clear, conjunctiva pink Ears- hearing intact Oropharynx- clear Neck- supple, no JVP Lymph- no cervical lymphadenopathy Lungs- Clear to ausculation bilaterally, normal work of breathing Heart-  regular rate and rhythm, no murmurs, rubs or gallops, PMI not laterally displaced GI- soft, NT, ND, + BS Extremities- no clubbing, cyanosis, or edema MS- no significant deformity or atrophy Skin- no rash or lesion Psych- euthymic mood, full affect Neuro- strength and sensation are intact  EKG- Sinus brady at 51 bpm, Pr int 178 ms, qrs int 74 ms, qtc 458 ms Epic records reviewed Creatinine  1.39, K+at 3.9, mag at  2.1 Echo-2/17-------------------------------------------------------------------- LV EF: 55% - 60%  ------------------------------------------------------------------- Indications: Chest pain 786.51.  ------------------------------------------------------------------- History: Risk factors: Paroxysmal A-Fib. Asthma. Pulmonary edema. Hypertension.  ------------------------------------------------------------------- Study Conclusions  - Left ventricle: The cavity size was normal. Systolic function was  normal. The estimated ejection fraction was in the range of 55%  to 60%. Wall motion was normal; there were no regional wall  motion abnormalities. The study is not technically sufficient to  allow evaluation of LV diastolic function. Doppler parameters are  consistent with high ventricular filling pressure. - Aortic valve: Transvalvular velocity was within the normal range.  There was no stenosis. There was mild to moderate regurgitation.  Regurgitation pressure half-time: 369 ms. - Mitral valve: Transvalvular velocity was within the normal range.  There was no evidence for stenosis. There was trivial  regurgitation. - Left atrium: The atrium was severely dilated. 49 mm - Right ventricle: The cavity size was normal. Wall thickness was  normal. Systolic function was normal. - Atrial septum: No defect or patent foramen ovale was identified  by color flow Doppler. - Tricuspid valve: There was mild regurgitation. - Inferior vena cava: The vessel was normal in size. The  respirophasic diameter changes were in the normal range (>= 50%),  consistent with normal central venous pressure.   Assessment and Plan: 1. Persistent afib Off amiodarone and maintaining SR  Amiodarone level is low Will have to discuss with Dr. Rayann Heman re next best step to keep in rhythm/ when afib returns, for now, she would like to avoid AAD's. She may be an ablation candidate having  failed one antiarrythmic due to prolonged QTc, but her left atrium is severly enlarged at 49 mm and am not sure if he thinks she would be best candidate. Changes to left atrium possibly secondary to radiation from L breast cancer. I have concerns with tikosyn, due to qtc prolongation on amiodarone. Continue apixaban  2. Diastolic HF Fluid status stable Continue lasix 40 mg am and 20 mg pm Limit fluids Limit salt  3. HTN Stable   F/u in afib  Clinic in 4 months, sooner if return of [place]. [place], [facility] [address] [place], [place] [address] [phone]

## 2015-10-24 ENCOUNTER — Encounter: Payer: Self-pay | Admitting: Internal Medicine

## 2015-10-24 ENCOUNTER — Ambulatory Visit (INDEPENDENT_AMBULATORY_CARE_PROVIDER_SITE_OTHER): Payer: Medicare Other | Admitting: Internal Medicine

## 2015-10-24 VITALS — BP 106/62 | HR 61 | Ht 59.0 in | Wt 132.8 lb

## 2015-10-24 DIAGNOSIS — R0600 Dyspnea, unspecified: Secondary | ICD-10-CM

## 2015-10-24 DIAGNOSIS — R942 Abnormal results of pulmonary function studies: Secondary | ICD-10-CM | POA: Diagnosis not present

## 2015-10-24 DIAGNOSIS — R0689 Other abnormalities of breathing: Secondary | ICD-10-CM

## 2015-10-24 DIAGNOSIS — R053 Chronic cough: Secondary | ICD-10-CM

## 2015-10-24 DIAGNOSIS — R05 Cough: Secondary | ICD-10-CM | POA: Diagnosis not present

## 2015-10-24 NOTE — Patient Instructions (Signed)
ICD-9-CM ICD-10-CM   1. Dyspnea and respiratory abnormality 786.09 R06.00     R06.89   2. Chronic cough 786.2 R05   3. Abnormal PFT 794.2 R94.2     Need to rule out lung inflammation or a group of diseases called ILD - Interstitial lung disease   Plan HRCT chest supine and prone; will call with results to discuss next step

## 2015-10-24 NOTE — Addendum Note (Signed)
Addended by: Collier Salina on: 10/24/2015 03:39 PM   Modules accepted: Orders

## 2015-10-24 NOTE — Progress Notes (Signed)
Subjective:    Patient ID: Patricia [name], female    DOB: Sep 28, 1940, 75 y.o.   MRN: [id number]  PCP Annye Asa, MD  Onc - Dr Magrinat [facility] [place]  HPI  IOV 10/24/2015  Chief Complaint  Patient presents with  . Pulmonary Consult    Pt referred by Dr. Radford Pax for abnormal PFT. Pt has occassional SOB with activity, cough with little mucus production - clear in color. Pt denies CP/tightness and f/c/s.    Patricia [name] is a 75 year old female with history of breast cancer status post left lumpectomy April 2016 stage IA. Status post urinary resection followed by radiation completing 11/22/2014. Last seen by oncology July 2017 and believed to be in complete remission on anastrozole which she is supposed to take to July 2020. She is also on tamoxifen denosumab/prlia. 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 She tells me for the last few to several years she's had chronic cough. Sometimes it is mild sometimes it is severe. It is improved overall. He can occasionally wake her up. It is mostly dry. There is no associated postnasal drainage or acid reflux. She does have significant shortness of breath for the last 1 year. It is on exertion moderate in severity. There is no associated chest pain or wheezing. Walking desaturation test 185 feet 3 laps on room air on a full head probe resting pulse ox was 97%. She dropped to 92% at the end. Also ox went from 60 at rest to 77/m.    Radiology review visualization of the images show in 2007 she had surgery in her lingula. Pathology shows that was done 06/19/2005 and it was a carcinoid. 2008 CT chest shows postop changes in the lingula but otherwise clear lung fields. However in June 2017 test etc. she seems to have increased interstitial infiltrates.   july 2017 Pulm function test: Restriction with low DLCO. FVC 1.5 L/71%, FEV1 1.28 L/81% ratio of 85/112%. Total lung capacity 3.48 L/83%. DLCO 11.3/69%  She did get started on amiodarone which  she believes was after the pulmonary function test. It was then. Within 2 weeks.    has a past medical history of Allergic rhinitis, cause unspecified; Aortic regurgitation (06/17/2015); Arthritis; Benign essential HTN; Cancer ([place]); Chronic cough; Chronic diastolic heart failure ([place]) (06/17/2015); Cough variant asthma; Disease of pharynx or nasopharynx; Diverticulosis; GERD (gastroesophageal reflux disease); Glaucoma; Hiatal hernia; Hyperlipidemia; Internal hemorrhoids; Laryngospasm; Laryngospasm; Multinodular goiter; Osteopenia; PAF (paroxysmal atrial fibrillation) ([place]); Postmenopausal; Radiation (10/22/14-11/23/14); and Stricture and stenosis of cervix.   reports that she quit smoking about 54 years ago. She has a 3.75 pack-year smoking history. She has never used smokeless tobacco.  Past Surgical History:  Procedure Laterality Date  . BREAST SURGERY    . CARDIOVERSION N/A 06/24/2015   Procedure: CARDIOVERSION;  Surgeon: Sueanne Margarita, MD;  [facility];  Service: Cardiovascular;  Laterality: N/A;  . COLONOSCOPY    . LUNG CANCER SURGERY  2003   resection carcinoid lingula-lt upper lobe  . RADIOACTIVE SEED GUIDED MASTECTOMY WITH AXILLARY SENTINEL LYMPH NODE BIOPSY Left 06/26/2014   Procedure: RADIOACTIVE SEED GUIDED PARTIAL MASTECTOMY WITH AXILLARY SENTINEL LYMPH NODE BIOPSY;  Surgeon: Stark Klein, MD;  Location: [place];  Service: General;  Laterality: Left;  . RE-EXCISION OF BREAST LUMPECTOMY Left 08/07/2014   Procedure: RE-EXCISION OF LEFT BREAST LUMPECTOMY;  Surgeon: Stark Klein, MD;  Location: [place];  Service: General;  Laterality: Left;    Allergies  Allergen Reactions  .  Combigan [Brimonidine Tartrate-Timolol] Itching    Eyes itch, reddened  . Sulfa Antibiotics Rash    Immunization History  Administered Date(s) Administered  . Hepatitis A 10/01/2015  . Hepatitis A, Adult 12/29/2012  . Influenza Split 12/11/2010, 12/01/2011  .  Influenza Whole 01/21/2007, 01/01/2009, 11/28/2009  . Influenza, High Dose Seasonal PF 12/01/2013, 12/14/2014  . Influenza,inj,Quad PF,36+ Mos 12/29/2012, 12/01/2013  . Pneumococcal Conjugate-13 02/08/2014  . Pneumococcal Polysaccharide-23 03/02/2005, 01/26/2011  . Td 03/02/2000, 12/29/2012  . Typhoid Inactivated 10/01/2015  . Yellow Fever 10/01/2015  . Zoster 05/03/2006    Family History  Problem Relation Age of Onset  . Stroke Mother   . Hypertension Mother   . Hyperlipidemia Mother   . Stroke Father   . Transient ischemic attack Father   . Hyperlipidemia Father   . Hypertension Father   . Atrial fibrillation Son   . Heart attack Neg Hx      Current Outpatient Prescriptions:  .  ADVAIR HFA 400-86 MCG/ACT inhaler, USE 2 INHALATIONS TWICE A DAY, Disp: 36 g, Rfl: 1 .  albuterol (PROVENTIL HFA;VENTOLIN HFA) 108 (90 Base) MCG/ACT inhaler, Inhale 2 puffs into the lungs every 6 (six) hours as needed for wheezing or shortness of breath., Disp: , Rfl:  .  anastrozole (ARIMIDEX) 1 MG tablet, Take 1 tablet (1 mg total) by mouth daily., Disp: 90 tablet, Rfl: 3 .  apixaban (ELIQUIS) 5 MG TABS tablet, Take 1 tablet (5 mg total) by mouth 2 (two) times daily., Disp: 180 tablet, Rfl: 3 .  atorvastatin (LIPITOR) 40 MG tablet, Take 1 tablet (40 mg total) by mouth daily., Disp: 30 tablet, Rfl: 6 .  bimatoprost (LUMIGAN) 0.03 % ophthalmic drops, Place 1 drop into both eyes at bedtime. , Disp: , Rfl:  .  diltiazem (CARDIZEM CD) 120 MG 24 hr capsule, Take 1 capsule (120 mg total) by mouth daily., Disp: 30 capsule, Rfl: 3 .  dorzolamide (TRUSOPT) 2 % ophthalmic solution, Place 1 drop into both eyes 2 (two) times daily., Disp: , Rfl:  .  furosemide (LASIX) 20 MG tablet, Take 1 tablet (20 mg total) by mouth as directed. Take 2 tabs in the morning and 1 tab in the pm, Disp: 270 tablet, Rfl: 3 .  gabapentin (NEURONTIN) 400 MG capsule, Take 400 mg by mouth 2 (two) times daily., Disp: , Rfl:  .  loratadine  (CLARITIN) 10 MG tablet, Take 10 mg by mouth daily as needed for allergies. Reported on 09/16/2015, Disp: , Rfl:  .  metoprolol tartrate (LOPRESSOR) 25 MG tablet, Take 1 tablet (25 mg total) by mouth 2 (two) times daily., Disp: 180 tablet, Rfl: 3 .  Multiple Vitamins-Iron (MULTIVITAMIN/IRON) TABS, Take 1 tablet by mouth daily. , Disp: , Rfl:  .  omeprazole (PRILOSEC) 20 MG capsule, Take 20 mg by mouth daily. TAKE 30 MINUTES BEFORE A MEAL, Disp: , Rfl:  .  thyroid (ARMOUR) 15 MG tablet, Take 15 mg by mouth daily., Disp: , Rfl:  .  traMADol (ULTRAM) 50 MG tablet, Take 50 mg by mouth every 6 (six) hours as needed for moderate pain., Disp: , Rfl:     Review of Systems  Constitutional: Negative for fever and unexpected weight change.  HENT: Negative for congestion, dental problem, ear pain, nosebleeds, postnasal drip, rhinorrhea, sinus pressure, sneezing, sore throat and trouble swallowing.   Eyes: Negative for redness and itching.  Respiratory: Negative for cough, chest tightness, shortness of breath and wheezing.   Cardiovascular: Negative for palpitations and leg swelling.  Gastrointestinal: Negative for nausea and vomiting.  Genitourinary: Negative for dysuria.  Musculoskeletal: Negative for joint swelling.  Skin: Negative for rash.  Neurological: Negative for headaches.  Hematological: Does not bruise/bleed easily.  Psychiatric/Behavioral: Negative for dysphoric mood. The patient is not nervous/anxious.        Objective:   Physical Exam  Constitutional: She is oriented to person, place, and time. She appears well-developed and well-nourished. No distress.  HENT:  Head: Normocephalic and atraumatic.  Right Ear: External ear normal.  Left Ear: External ear normal.  Mouth/Throat: Oropharynx is clear and moist. No oropharyngeal exudate.  Eyes: Conjunctivae and EOM are normal. Pupils are equal, round, and reactive to light. Right eye exhibits no discharge. Left eye exhibits no discharge.  No scleral icterus.  Neck: Normal range of motion. Neck supple. No JVD present. No tracheal deviation present. No thyromegaly present.  Cardiovascular: Normal rate, regular rhythm, normal heart sounds and intact distal pulses.  Exam reveals no gallop and no friction rub.   No murmur heard. Pulmonary/Chest: Effort normal and breath sounds normal. No respiratory distress. She has no wheezes. She has no rales. She exhibits no tenderness.  Infra-axillary scar from previous resection Left greater than right basal crackles very mild in intensity  Abdominal: Soft. Bowel sounds are normal. She exhibits no distension and no mass. There is no tenderness. There is no rebound and no guarding.  Musculoskeletal: Normal range of motion. She exhibits no edema or tenderness.  Lymphadenopathy:    She has no cervical adenopathy.  Neurological: She is alert and oriented to person, place, and time. She has normal reflexes. No cranial nerve deficit. She exhibits normal muscle tone. Coordination normal.  Skin: Skin is warm and dry. No rash noted. She is not diaphoretic. No erythema. No pallor.  Psychiatric: She has a normal mood and affect. Her behavior is normal. Judgment and thought content normal.  Vitals reviewed.   Vitals:   10/24/15 1454  BP: 106/62  Pulse: 61  SpO2: 95%  Weight: 132 lb 12.8 oz (60.2 kg)  Height: '4\' 11"'$  (1.499 m)          Assessment & Plan:     ICD-9-CM ICD-10-CM   1. Dyspnea and respiratory abnormality 786.09 R06.00     R06.89   2. Chronic cough 786.2 R05   3. Abnormal PFT 794.2 R94.2     The constellation of shortness of breath, cough, possible crackles and a tendency to desaturate even though pulse ox is adequate associated with a history of radiation therapy and breast cancer suggest that we need to rule out interstitial lung disease   Plan HRCT chest supine and prone; will call with results to discuss next step If she does have ILD and then we will proceed with  autoimmune workup and medication review look for drug causes of ILD If there is no ILD or CT scan does not reveal a source of the dyspnea, consider CPST +/- feno   She verbalizes agreement and understanding for the plan  Dr. Brand Males, M.D., [facility].C.P Pulmonary and Critical Care Medicine Staff Physician [facility] Pager: [phone], If no answer or between  15:00h - 7:00h: call [phone]  10/24/2015 3:37 PM

## 2015-10-29 ENCOUNTER — Ambulatory Visit (INDEPENDENT_AMBULATORY_CARE_PROVIDER_SITE_OTHER)
Admission: RE | Admit: 2015-10-29 | Discharge: 2015-10-29 | Disposition: A | Discharge status: Home / Self Care | Payer: Medicare Other | Source: Ambulatory Visit | Attending: Internal Medicine | Admitting: Internal Medicine

## 2015-10-29 DIAGNOSIS — R0600 Dyspnea, unspecified: Secondary | ICD-10-CM | POA: Diagnosis not present

## 2015-10-29 DIAGNOSIS — R0602 Shortness of breath: Secondary | ICD-10-CM | POA: Diagnosis not present

## 2015-10-29 DIAGNOSIS — R0689 Other abnormalities of breathing: Secondary | ICD-10-CM | POA: Diagnosis not present

## 2015-11-01 ENCOUNTER — Telehealth: Payer: Self-pay | Admitting: Internal Medicine

## 2015-11-01 DIAGNOSIS — R0600 Dyspnea, unspecified: Secondary | ICD-10-CM

## 2015-11-01 NOTE — Telephone Encounter (Signed)
No evidence of ILD Dyspnea unexplained  Plan  - do cpst with eib challenge and return for fu  Thanks  Dr. Brand Males, M.D., [facility].C.P Pulmonary and Critical Care Medicine Staff Physician [place] [facility] Pager: [phone], If no answer or between  15:00h - 7:00h: call [phone]  11/01/2015 3:29 PM      Ct Chest High Resolution  Result Date: 10/30/2015 CLINICAL DATA:  Shortness of breath and respiratory abnormality. History of lung and breast cancer. EXAM: CT CHEST WITHOUT CONTRAST TECHNIQUE: Multidetector CT imaging of the chest was performed following the standard protocol without intravenous contrast. High resolution imaging of the lungs, as well as inspiratory and expiratory imaging, was performed. COMPARISON:  Chest radiographs dating back to 09/09/2007. CT chest 07/28/2006. FINDINGS: Cardiovascular: Atherosclerotic calcification of the arterial vasculature. Heart is mildly enlarged. No pericardial effusion. Mediastinum/Nodes: Low-attenuation lesion in the right lobe of the thyroid measures 1.6 cm, incompletely imaged but similar to 07/28/2006. No pathologically enlarged mediastinal or axillary lymph nodes. There are surgical clips in the left axilla. Hilar regions are difficult to definitively evaluate without IV contrast. Tiny hiatal hernia. Esophagus is grossly unremarkable. Lungs/Pleura: No subpleural reticulation, traction bronchiectasis/bronchiolectasis, architectural distortion or honeycombing. There is mosaic attenuation in the lungs, worst at the lung bases which manifests as air trapping on expiratory imaging. Postoperative changes in the left hemi thorax with associated volume loss and scarring. Subpleural radiation changes in the anterior left upper lobe. No pleural fluid. Airway is otherwise unremarkable. Upper Abdomen: Visualized portions of the liver, gallbladder are and right adrenal gland are unremarkable. Fluid density  nodule in the left adrenal gland measures 2.1 cm. Visualized portions of the kidneys, spleen, pancreas, stomach and bowel are grossly unremarkable with the exception of a tiny hiatal hernia. No upper abdominal adenopathy. Musculoskeletal: No worrisome lytic or sclerotic lesions. Degenerative changes are seen in the spine. Postoperative changes in the left breast. IMPRESSION: 1. 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 No evidence of fibrotic interstitial lung disease. 2. Aortic atherosclerosis. 3. Left adrenal adenoma. Electronically Signed   By: Lorin Picket M.D.   On: 10/30/2015 08:22

## 2015-11-01 NOTE — Telephone Encounter (Signed)
LVM for pt to return call

## 2015-11-12 NOTE — Telephone Encounter (Signed)
Spoke with pt and gave results and recommendations. She is fine with doing CPST. Order placed. F/u appt made with MR for 12/06/15. Nothing further needed.

## 2015-11-12 NOTE — Telephone Encounter (Signed)
Patient returning our call - pt can be reached at [phone]

## 2015-11-20 ENCOUNTER — Ambulatory Visit: Payer: Self-pay | Admitting: Oncology

## 2015-11-20 ENCOUNTER — Other Ambulatory Visit: Payer: Self-pay

## 2015-11-21 ENCOUNTER — Ambulatory Visit: Payer: Self-pay | Admitting: Cardiology

## 2015-11-25 ENCOUNTER — Other Ambulatory Visit: Payer: Self-pay | Admitting: General Practice

## 2015-11-25 ENCOUNTER — Encounter: Payer: Self-pay | Admitting: Cardiology

## 2015-11-25 ENCOUNTER — Ambulatory Visit (INDEPENDENT_AMBULATORY_CARE_PROVIDER_SITE_OTHER): Payer: Medicare Other | Admitting: Cardiology

## 2015-11-25 VITALS — BP 120/62 | HR 46 | Ht 59.0 in | Wt 136.0 lb

## 2015-11-25 DIAGNOSIS — I10 Essential (primary) hypertension: Secondary | ICD-10-CM

## 2015-11-25 DIAGNOSIS — I351 Nonrheumatic aortic (valve) insufficiency: Secondary | ICD-10-CM

## 2015-11-25 DIAGNOSIS — I639 Cerebral infarction, unspecified: Secondary | ICD-10-CM

## 2015-11-25 DIAGNOSIS — I4819 Other persistent atrial fibrillation: Secondary | ICD-10-CM

## 2015-11-25 DIAGNOSIS — I480 Paroxysmal atrial fibrillation: Secondary | ICD-10-CM

## 2015-11-25 DIAGNOSIS — I5032 Chronic diastolic (congestive) heart failure: Secondary | ICD-10-CM

## 2015-11-25 MED ORDER — GABAPENTIN 400 MG PO CAPS: 400.0000 mg | ORAL_CAPSULE | Freq: Two times a day (BID) | ORAL | 1 refills | Status: DC

## 2015-11-25 MED ORDER — TRAMADOL HCL 50 MG PO TABS: 50.0000 mg | ORAL_TABLET | Freq: Four times a day (QID) | ORAL | 0 refills | Status: DC | PRN

## 2015-11-25 NOTE — Telephone Encounter (Signed)
Last OV 08/30/15 Gabapentin last filled 08/10/14 #180 with 1 Tramadol last filled 06/19/15 #90 with 0  Pt wants these to mail order please advise?

## 2015-11-25 NOTE — Patient Instructions (Addendum)
Medication Instructions:  1) STOP CARDIZEM  Labwork: None  Testing/Procedures: Your physician has requested that you have an echocardiogram in March, 2018. Echocardiography is a painless test that uses sound waves to create images of your heart. It provides your doctor with information about the size and shape of your heart and how well your heart's chambers and valves are working. This procedure takes approximately one hour. There are no restrictions for this procedure.  Follow-Up: Your physician recommends that you schedule a follow-up appointment in 2 weeks with Roderic Palau in the [facility].  Your physician wants you to follow-up in: 6 months with Dr. Radford Pax. You will receive a reminder letter in the mail two months in advance. If you don't receive a letter, please call our office to schedule the follow-up appointment.   Any Other Special Instructions Will Be Listed Below (If Applicable).     If you need a refill on your cardiac medications before your next appointment, please call your pharmacy.

## 2015-11-25 NOTE — Progress Notes (Signed)
Cardiology Office Note    Date:  11/25/2015   ID:  Patricia [name], DOB 09/19/1940, MRN [id number]  PCP:  Annye Asa, MD  Cardiologist:  Fransico Him, MD   No chief complaint on file.   History of Present Illness:  Patricia [name] is a 75 y.o. female with a history of lung CA s/p resection (2003), breast CA s/p lumpectomy/Tamoxifen/XRT, GERD, hypothyroidism, HTN and HLD who presents for follow up. 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 She underwent DCCV to NSR on 06/24/2015. I saw her back in followup and she was back in atrial fibrillation.  She was started on Amio '200mg'$  BID but this was subsequently stopped due to prolonged QTc.  She also had some problems with diastolic CHF and her Lasix was increased for a few days by Richardson Dopp, PA.  She was recently seen in afib clinic and complained of chest heaviness and fatigue as well as SOB when in atrial fibrillation but nuclear stress test with no ischemia 04/2015.  She was not in afib on that OV.  The patient did not want to go back on AADs.  She was seen recently by Dr. Chase Caller for SOB and a CPST is scheduled for tomorrow.  She is currently undergoing evaluation for other etiologies of SOB.  Chest CT showed no evidence of ILD.  There was some calcification of the aorta by chest CT.  She is now back for followup.  She denies any chest pain,palpitations, orthopnea, PND. No dizziness, LE edema or syncope. She has not had any further SOB and also has not had any further PAF.   Past Medical History:  Diagnosis Date  . Allergic rhinitis, cause unspecified   . Aortic regurgitation 06/17/2015   Mild to moderate AR by echo 04/2015  . 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 Benign essential HTN   . 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 Chronic cough   . Chronic diastolic heart failure ([place]) 06/17/2015  . Cough variant asthma   . Disease of  pharynx or nasopharynx   . Diverticulosis   . GERD (gastroesophageal reflux disease)   . Glaucoma   . Hiatal hernia   . Hyperlipidemia   . Internal hemorrhoids   . Laryngospasm   . Laryngospasm   . Multinodular goiter   . Osteopenia   . PAF (paroxysmal atrial fibrillation) (HCC)    CHADS2VASC score is 7 - on Apixaban.  S/P DCCV 06/2015  . Postmenopausal   . Radiation 10/22/14-11/23/14   Left Breast  . Stricture and stenosis of cervix     Past Surgical History:  Procedure Laterality Date  . BREAST SURGERY    . CARDIOVERSION N/A 06/24/2015   Procedure: CARDIOVERSION;  Surgeon: Sueanne Margarita, MD;  [facility];  Service: Cardiovascular;  Laterality: N/A;  . COLONOSCOPY    . LUNG CANCER SURGERY  2003   resection carcinoid lingula-lt upper lobe  . RADIOACTIVE SEED GUIDED MASTECTOMY WITH AXILLARY SENTINEL LYMPH NODE BIOPSY Left 06/26/2014   Procedure: RADIOACTIVE SEED GUIDED PARTIAL MASTECTOMY WITH AXILLARY SENTINEL LYMPH NODE BIOPSY;  Surgeon: Stark Klein, MD;  Location: [place];  Service: General;  Laterality: Left;  . RE-EXCISION OF BREAST LUMPECTOMY Left 08/07/2014   Procedure: RE-EXCISION OF LEFT BREAST LUMPECTOMY;  Surgeon: Stark Klein, MD;  Location: [place];  Service: General;  Laterality: Left;    Current Medications: Outpatient Medications Prior to Visit  Medication Sig Dispense Refill  . ADVAIR HFA 230-21 MCG/ACT inhaler USE 2 INHALATIONS TWICE A DAY 36 g 1  . albuterol (PROVENTIL HFA;VENTOLIN HFA) 108 (90 Base) MCG/ACT inhaler Inhale 2 puffs into the lungs every 6 (six) hours as needed for wheezing or shortness of breath.    . anastrozole (ARIMIDEX) 1 MG tablet Take 1 tablet (1 mg total) by mouth daily. 90 tablet 3  . apixaban (ELIQUIS) 5 MG TABS tablet Take 1 tablet (5 mg total) by mouth 2 (two) times daily. 180 tablet 3  . atorvastatin (LIPITOR) 40 MG tablet Take 1 tablet (40 mg total) by mouth daily. 30 tablet 6  .  bimatoprost (LUMIGAN) 0.03 % ophthalmic drops Place 1 drop into both eyes at bedtime.     Marland Kitchen diltiazem (CARDIZEM CD) 120 MG 24 hr capsule Take 1 capsule (120 mg total) by mouth daily. 30 capsule 3  . dorzolamide (TRUSOPT) 2 % ophthalmic solution Place 1 drop into both eyes 2 (two) times daily.    . furosemide (LASIX) 20 MG tablet Take 1 tablet (20 mg total) by mouth as directed. Take 2 tabs in the morning and 1 tab in the pm 270 tablet 3  . gabapentin (NEURONTIN) 400 MG capsule Take 400 mg by mouth 2 (two) times daily.    Marland Kitchen loratadine (CLARITIN) 10 MG tablet Take 10 mg by mouth daily as needed for allergies. Reported on 09/16/2015    . metoprolol tartrate (LOPRESSOR) 25 MG tablet Take 1 tablet (25 mg total) by mouth 2 (two) times daily. 180 tablet 3  . Multiple Vitamins-Iron (MULTIVITAMIN/IRON) TABS Take 1 tablet by mouth daily.     Marland Kitchen omeprazole (PRILOSEC) 20 MG capsule Take 20 mg by mouth daily. TAKE 30 MINUTES BEFORE A MEAL    . thyroid (ARMOUR) 15 MG tablet Take 15 mg by mouth daily.    . traMADol (ULTRAM) 50 MG tablet Take 50 mg by mouth every 6 (six) hours as needed for moderate pain.     No facility-administered medications prior to visit.      Allergies:   Combigan [brimonidine tartrate-timolol]; Other; Sulfamethoxazole-trimethoprim; and Sulfa antibiotics   Social History   Social History  . Marital status: Married    Spouse name: N/A  . Number of children: N/A  . Years of education: N/A   Social History Main Topics  . Smoking status: Former Smoker    Packs/day: 0.75    Years: 5.00    Quit date: 06/14/1961  . Smokeless tobacco: Never Used  . Alcohol use 0.0 oz/week     Comment: occasional wine  . Drug use: No  . Sexual activity: Not Asked   Other Topics Concern  . None   Social History Narrative   Retired. Lives with husband.      Family History:  The patient's family history includes Atrial fibrillation in her son; Hyperlipidemia in her father and mother; Hypertension  in her father and mother; Stroke in her father and mother; Transient ischemic attack in her father.   ROS:   Please see the history of present illness.    ROS All other systems reviewed and are negative.  No flowsheet data found.     PHYSICAL EXAM:   VS:  BP 120/62   Pulse (!) 46   Ht '4\' 11"'$  (1.499 m)   Wt 136 lb (61.7 kg)   SpO2 96%   BMI 27.47 kg/m  GEN: Well nourished, well developed, in no acute distress  HEENT: normal  Neck: no JVD, carotid bruits, or masses Cardiac: RRR; no murmurs, rubs, or gallops,no edema.  Intact distal pulses bilaterally.  Respiratory:  clear to auscultation bilaterally, normal work of breathing GI: soft, nontender, nondistended, + BS MS: no deformity or atrophy  Skin: warm and dry, no rash Neuro:  Alert and Oriented x 3, Strength and sensation are intact Psych: euthymic mood, full affect  Wt Readings from Last 3 Encounters:  11/25/15 136 lb (61.7 kg)  10/24/15 132 lb 12.8 oz (60.2 kg)  10/11/15 134 lb 12.8 oz (61.1 kg)      Studies/Labs Reviewed:   EKG:  EKG is ordered today.  The ekg ordered today demonstrates sinus bradycardia at 47bpm with nonspecific T wave abnormality  Recent Labs: 04/27/2015: TSH 2.802 08/25/2015: B Natriuretic Peptide 484.1 09/09/2015: Magnesium 2.1 09/24/2015: ALT 27; HGB 15.0; Platelets 218 10/07/2015: BUN 21; Creat 1.16; Potassium 3.7; Sodium 139   Lipid Panel    Component Value Date/Time   CHOL 126 09/09/2015 1031   TRIG 71 09/09/2015 1031   TRIG 52 01/13/2006 1012   HDL 58 09/09/2015 1031   CHOLHDL 2.2 09/09/2015 1031   VLDL 14 09/09/2015 1031   LDLCALC 54 09/09/2015 1031   LDLDIRECT 174.3 06/04/2009 0000    Additional studies/ records that were reviewed today include:  none    ASSESSMENT:    1. PAF (paroxysmal atrial fibrillation) ([place])   2. Chronic diastolic heart failure ([place])   3. Benign essential HTN   4. Aortic regurgitation      PLAN:  In order of problems listed above:  1.  PAF  with no further episodes of afib recently.  She is off Amio due to QT prolongation.  She is not interested in pursuring other AADs but unfortunately is very symptomatic when she goes into afib.  Not sure she would be an ablation candidate with severe LAE on echo.  LA volume was 38m/m2.  She is bradycardic today with a HR of 47bpm.  I will stop her CCB and have her followup in afib clinic in 2 weeks.  Continue DOAC and BB.  Creatinine and Hbg stable.  2.  Chronic diastolic CHF - she appears euvolemic on exam. I stressed the importance of low sodium diet and daily weights.   Continue diuretic/CCB/BB.   3.  HTN - BP controlled on current meds.  Continue CCB/BB 4.  Mild to moderate AR - repeat echo 05/2015.      Medication Adjustments/Labs and Tests Ordered: Current medicines are reviewed at length with the patient today.  Concerns regarding medicines are outlined above.  Medication changes, Labs and Tests ordered today are listed in the Patient Instructions below.  There are no Patient Instructions on file for this visit.   Signed, TFransico Him MD  11/25/2015 10:39 AM    [facility] [address] [place]  [id number]: [phone] Fax: [phone]

## 2015-11-25 NOTE — Addendum Note (Signed)
Addended by: Harland German A on: 11/25/2015 11:42 AM   Modules accepted: Orders

## 2015-11-25 NOTE — Telephone Encounter (Signed)
Medication filled to pharmacy as requested.   

## 2015-11-26 ENCOUNTER — Ambulatory Visit (HOSPITAL_COMMUNITY): Payer: Medicare Other | Attending: Internal Medicine

## 2015-11-26 DIAGNOSIS — R0600 Dyspnea, unspecified: Secondary | ICD-10-CM | POA: Diagnosis not present

## 2015-11-27 ENCOUNTER — Telehealth: Payer: Self-pay | Admitting: Family Medicine

## 2015-11-27 ENCOUNTER — Other Ambulatory Visit: Payer: Self-pay | Admitting: Cardiology

## 2015-11-27 MED ORDER — METOPROLOL TARTRATE 25 MG PO TABS: 25.0000 mg | ORAL_TABLET | Freq: Two times a day (BID) | ORAL | 0 refills | Status: DC

## 2015-11-27 NOTE — Telephone Encounter (Signed)
Pt last OV 08/30/15, ok for medication?

## 2015-11-27 NOTE — Telephone Encounter (Signed)
Pt states that she will be traveling the [facility] in Jan and asking for a Rx for malaria pills before her travels, [place] [facility]

## 2015-11-29 ENCOUNTER — Other Ambulatory Visit: Payer: Self-pay | Admitting: *Deleted

## 2015-11-29 MED ORDER — ATORVASTATIN CALCIUM 40 MG PO TABS: 40.0000 mg | ORAL_TABLET | Freq: Every day | ORAL | 3 refills | Status: DC

## 2015-11-30 ENCOUNTER — Other Ambulatory Visit: Payer: Self-pay | Admitting: Family Medicine

## 2015-11-30 DIAGNOSIS — I351 Nonrheumatic aortic (valve) insufficiency: Secondary | ICD-10-CM

## 2015-11-30 DIAGNOSIS — I5032 Chronic diastolic (congestive) heart failure: Secondary | ICD-10-CM

## 2015-11-30 DIAGNOSIS — I4819 Other persistent atrial fibrillation: Secondary | ICD-10-CM

## 2015-11-30 DIAGNOSIS — I10 Essential (primary) hypertension: Secondary | ICD-10-CM

## 2015-12-02 DIAGNOSIS — R0600 Dyspnea, unspecified: Secondary | ICD-10-CM | POA: Diagnosis not present

## 2015-12-03 NOTE — Telephone Encounter (Signed)
I will be happy to prescribe a malaria medication but I have a few questions: how long are you traveling for?  This makes a difference in how long you need to take the medication. Also, they are recommending a Typhoid vaccine

## 2015-12-03 NOTE — Telephone Encounter (Signed)
Pt is traveling to [place], she will be gone from December 16th -January 6th, typhoid and yellow fever and second Hepatitis vaccinations were completed at the [facility] travel clinic.   Mr. Defino advised that Dr. Larose Kells was sending in his malaria medication in November.

## 2015-12-03 NOTE — Telephone Encounter (Signed)
Called pt and LMOVM to find out what country exactly is she traveling to? How long will she be gone? Has she had a typhoid vaccination?

## 2015-12-06 ENCOUNTER — Ambulatory Visit: Payer: Self-pay | Admitting: Internal Medicine

## 2015-12-07 ENCOUNTER — Other Ambulatory Visit: Payer: Self-pay | Admitting: Family Medicine

## 2015-12-07 DIAGNOSIS — I4819 Other persistent atrial fibrillation: Secondary | ICD-10-CM

## 2015-12-07 DIAGNOSIS — I5032 Chronic diastolic (congestive) heart failure: Secondary | ICD-10-CM

## 2015-12-07 DIAGNOSIS — I351 Nonrheumatic aortic (valve) insufficiency: Secondary | ICD-10-CM

## 2015-12-07 DIAGNOSIS — I10 Essential (primary) hypertension: Secondary | ICD-10-CM

## 2015-12-09 ENCOUNTER — Encounter (HOSPITAL_COMMUNITY): Payer: Self-pay | Admitting: Nurse Practitioner

## 2015-12-09 ENCOUNTER — Ambulatory Visit (HOSPITAL_COMMUNITY)
Admission: RE | Admit: 2015-12-09 | Discharge: 2015-12-09 | Disposition: A | Discharge status: Home / Self Care | Payer: Medicare Other | Source: Ambulatory Visit | Attending: Nurse Practitioner | Admitting: Nurse Practitioner

## 2015-12-09 VITALS — BP 122/74 | HR 58 | Ht 59.0 in | Wt 137.6 lb

## 2015-12-09 DIAGNOSIS — K219 Gastro-esophageal reflux disease without esophagitis: Secondary | ICD-10-CM | POA: Insufficient documentation

## 2015-12-09 DIAGNOSIS — K5790 Diverticulosis of intestine, part unspecified, without perforation or abscess without bleeding: Secondary | ICD-10-CM | POA: Diagnosis not present

## 2015-12-09 DIAGNOSIS — Z85118 Personal history of other malignant neoplasm of bronchus and lung: Secondary | ICD-10-CM | POA: Diagnosis not present

## 2015-12-09 DIAGNOSIS — K648 Other hemorrhoids: Secondary | ICD-10-CM | POA: Diagnosis not present

## 2015-12-09 DIAGNOSIS — I4819 Other persistent atrial fibrillation: Secondary | ICD-10-CM

## 2015-12-09 DIAGNOSIS — E042 Nontoxic multinodular goiter: Secondary | ICD-10-CM | POA: Insufficient documentation

## 2015-12-09 DIAGNOSIS — I5032 Chronic diastolic (congestive) heart failure: Secondary | ICD-10-CM | POA: Insufficient documentation

## 2015-12-09 DIAGNOSIS — I10 Essential (primary) hypertension: Secondary | ICD-10-CM | POA: Diagnosis not present

## 2015-12-09 DIAGNOSIS — I481 Persistent atrial fibrillation: Secondary | ICD-10-CM | POA: Insufficient documentation

## 2015-12-09 DIAGNOSIS — I351 Nonrheumatic aortic (valve) insufficiency: Secondary | ICD-10-CM | POA: Insufficient documentation

## 2015-12-09 DIAGNOSIS — Z7901 Long term (current) use of anticoagulants: Secondary | ICD-10-CM | POA: Insufficient documentation

## 2015-12-09 DIAGNOSIS — Z87891 Personal history of nicotine dependence: Secondary | ICD-10-CM | POA: Diagnosis not present

## 2015-12-09 DIAGNOSIS — J309 Allergic rhinitis, unspecified: Secondary | ICD-10-CM | POA: Diagnosis not present

## 2015-12-09 DIAGNOSIS — M19049 Primary osteoarthritis, unspecified hand: Secondary | ICD-10-CM | POA: Insufficient documentation

## 2015-12-09 DIAGNOSIS — H409 Unspecified glaucoma: Secondary | ICD-10-CM | POA: Insufficient documentation

## 2015-12-09 DIAGNOSIS — E785 Hyperlipidemia, unspecified: Secondary | ICD-10-CM | POA: Insufficient documentation

## 2015-12-09 DIAGNOSIS — K449 Diaphragmatic hernia without obstruction or gangrene: Secondary | ICD-10-CM | POA: Diagnosis not present

## 2015-12-09 DIAGNOSIS — M8580 Other specified disorders of bone density and structure, unspecified site: Secondary | ICD-10-CM | POA: Insufficient documentation

## 2015-12-09 NOTE — Progress Notes (Signed)
Primary Care Physician: Annye Asa, MD Primary Cardiologist: Anjannette Gauger is a 75 y.o. female with a history of persistent atrial fibrillation who presents for follow up in the [facility].  Since last being seen in clinic, the patient reports doing very well. She is largely unaware of atrial fibrillation and is unable to tell when she is in SR vs AF.  Today, she denies symptoms of palpitations, chest pain, shortness of breath, orthopnea, PND, lower extremity edema, dizziness, presyncope, syncope, snoring, daytime somnolence, bleeding, or neurologic sequela. The patient is tolerating medications without difficulties and is otherwise without complaint today.    Past Medical History:  Diagnosis Date  . Allergic rhinitis, cause unspecified   . Aortic regurgitation 06/17/2015   Mild to moderate AR by echo 04/2015  . 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 Benign essential HTN   . 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 Chronic cough   . Chronic diastolic heart failure ([place]) 06/17/2015  . Cough variant asthma   . Disease of pharynx or nasopharynx   . Diverticulosis   . GERD (gastroesophageal reflux disease)   . Glaucoma   . Hiatal hernia   . Hyperlipidemia   . Internal hemorrhoids   . Laryngospasm   . Laryngospasm   . Multinodular goiter   . Osteopenia   . PAF (paroxysmal atrial fibrillation) ([facility])    CHADS2VASC score is 7 - on Apixaban.  S/P DCCV 06/2015  . Postmenopausal   . Radiation 10/22/14-11/23/14   Left Breast  . Stricture and stenosis of cervix    Past Surgical History:  Procedure Laterality Date  . BREAST SURGERY    . CARDIOVERSION N/A 06/24/2015   Procedure: CARDIOVERSION;  Surgeon: Sueanne Margarita, MD;  [facility];  Service: Cardiovascular;  Laterality: N/A;  . COLONOSCOPY    . LUNG CANCER SURGERY  2003   resection carcinoid lingula-lt upper lobe  . RADIOACTIVE SEED GUIDED MASTECTOMY WITH AXILLARY SENTINEL  LYMPH NODE BIOPSY Left 06/26/2014   Procedure: RADIOACTIVE SEED GUIDED PARTIAL MASTECTOMY WITH AXILLARY SENTINEL LYMPH NODE BIOPSY;  Surgeon: Stark Klein, MD;  Location: [place];  Service: General;  Laterality: Left;  . RE-EXCISION OF BREAST LUMPECTOMY Left 08/07/2014   Procedure: RE-EXCISION OF LEFT BREAST LUMPECTOMY;  Surgeon: Stark Klein, MD;  Location: [place];  Service: General;  Laterality: Left;    Current Outpatient Prescriptions  Medication Sig Dispense Refill  . ADVAIR HFA 230-21 MCG/ACT inhaler USE 2 INHALATIONS TWICE A DAY 36 g 1  . albuterol (PROVENTIL HFA;VENTOLIN HFA) 108 (90 Base) MCG/ACT inhaler Inhale 2 puffs into the lungs every 6 (six) hours as needed for wheezing or shortness of breath.    . anastrozole (ARIMIDEX) 1 MG tablet Take 1 tablet (1 mg total) by mouth daily. 90 tablet 3  . apixaban (ELIQUIS) 5 MG TABS tablet Take 1 tablet (5 mg total) by mouth 2 (two) times daily. 180 tablet 3  . ARMOUR THYROID 15 MG tablet TAKE 1 TABLET DAILY 90 tablet 1  . atorvastatin (LIPITOR) 40 MG tablet Take 1 tablet (40 mg total) by mouth daily. 90 tablet 3  . bimatoprost (LUMIGAN) 0.03 % ophthalmic drops Place 1 drop into both eyes at bedtime.     . dorzolamide (TRUSOPT) 2 % ophthalmic solution Place 1 drop into both eyes 2 (two) times daily.    . furosemide (LASIX) 20 MG tablet  Take 1 tablet (20 mg total) by mouth as directed. Take 2 tabs in the morning and 1 tab in the pm 270 tablet 3  . gabapentin (NEURONTIN) 400 MG capsule Take 1 capsule (400 mg total) by mouth 2 (two) times daily. 180 capsule 1  . loratadine (CLARITIN) 10 MG tablet Take 10 mg by mouth daily as needed for allergies. Reported on 09/16/2015    . metoprolol tartrate (LOPRESSOR) 25 MG tablet Take 1 tablet (25 mg total) by mouth 2 (two) times daily. 20 tablet 0  . Multiple Vitamins-Iron (MULTIVITAMIN/IRON) TABS Take 1 tablet by mouth daily.     Marland Kitchen omeprazole (PRILOSEC) 20 MG capsule TAKE 1  CAPSULE DAILY 30 MINUTES BEFORE A MEAL 90 capsule 1  . traMADol (ULTRAM) 50 MG tablet Take 1 tablet (50 mg total) by mouth every 6 (six) hours as needed for moderate pain. 90 tablet 0   No current facility-administered medications for this encounter.     Allergies  Allergen Reactions  . Combigan [Brimonidine Tartrate-Timolol] Itching    Eyes itch, reddened  . Other Other (See Comments)    Per patient made OU red, Sore, and sensitivity to light  . Sulfamethoxazole-Trimethoprim Hives  . Sulfa Antibiotics Rash    Social History   Social History  . Marital status: Married    Spouse name: N/A  . Number of children: N/A  . Years of education: N/A   Occupational History  . Not on file.   Social History Main Topics  . Smoking status: Former Smoker    Packs/day: 0.75    Years: 5.00    Quit date: 06/14/1961  . Smokeless tobacco: Never Used  . Alcohol use 0.0 oz/week     Comment: occasional wine  . Drug use: No  . Sexual activity: Not on file   Other Topics Concern  . Not on file   Social History Narrative   Retired. Lives with husband.     Family History  Problem Relation Age of Onset  . Stroke Mother   . Hypertension Mother   . Hyperlipidemia Mother   . Stroke Father   . Transient ischemic attack Father   . Hyperlipidemia Father   . Hypertension Father   . Atrial fibrillation Son   . Heart attack Neg Hx     ROS- All systems are reviewed and negative except as per the HPI above.  Physical Exam: Vitals:   12/09/15 0855  BP: 122/74  Pulse: (!) 58  Weight: 137 lb 9.6 oz (62.4 kg)  Height: '4\' 11"'$  (1.499 m)    GEN- The patient is elderly appearing, alert and oriented x 3 today.   Head- normocephalic, atraumatic Eyes-  Sclera clear, conjunctiva pink Ears- hearing intact Oropharynx- clear Neck- supple  Lungs- Clear to ausculation bilaterally, normal work of breathing Heart- Regular rate and rhythm  GI- soft, NT, ND, + BS Extremities- no clubbing, cyanosis,  or edema MS- no significant deformity or atrophy Skin- no rash or lesion Psych- euthymic mood, full affect Neuro- strength and sensation are intact  Wt Readings from Last 3 Encounters:  12/09/15 137 lb 9.6 oz (62.4 kg)  11/25/15 136 lb (61.7 kg)  10/24/15 132 lb 12.8 oz (60.2 kg)    EKG today demonstrates sinus bradycardia, rate 58, QRS 71mec, QTc 4138mc Echo 04/2015 demonstrated EF 55-60%, no RWMA, LA 49  Epic records are reviewed at length today  Assessment and Plan:  1. Persistent atrial fibrillation The patient has asymptomatic persistent atrial fibrillation.  With severely enlarged LA, I think our ability to maintain SR long term is reduced. For now, will continue current therapy.  She does rarely have increased shortness of breath with AF - ok to take 1 extra Lasix as needed no more than once per week. She does not want to take other AAD's which I think is reasonable with low burden of symptoms.  Continue Eliquis for CHADS2VASC of 4  2. HTN Stable No change required today  3.  Chronic diastolic heart failure Euvolemic on exam Continue current therapy  Follow up with Butch Penny in AF clinic in 3 months.    Chanetta Marshall, NP 12/09/2015 9:30 AM

## 2015-12-10 ENCOUNTER — Telehealth: Payer: Self-pay | Admitting: Internal Medicine

## 2015-12-10 ENCOUNTER — Encounter: Payer: Self-pay | Admitting: Internal Medicine

## 2015-12-10 ENCOUNTER — Ambulatory Visit (INDEPENDENT_AMBULATORY_CARE_PROVIDER_SITE_OTHER): Payer: Medicare Other | Admitting: Internal Medicine

## 2015-12-10 VITALS — BP 112/68 | HR 63 | Ht 59.0 in | Wt 138.0 lb

## 2015-12-10 DIAGNOSIS — R0689 Other abnormalities of breathing: Secondary | ICD-10-CM

## 2015-12-10 DIAGNOSIS — I639 Cerebral infarction, unspecified: Secondary | ICD-10-CM | POA: Diagnosis not present

## 2015-12-10 DIAGNOSIS — R0600 Dyspnea, unspecified: Secondary | ICD-10-CM | POA: Diagnosis not present

## 2015-12-10 NOTE — Patient Instructions (Addendum)
ICD-9-CM ICD-10-CM   1. Dyspnea and respiratory abnormality 786.09 R06.00     R06.89     Will talk to Dr Radford Pax about reducing lopressor If this does not help, then might need right heart cath or cardiac stress test or both  Followup  - as needed

## 2015-12-10 NOTE — Progress Notes (Signed)
Pt seen today . 12/10/15 Results were discussed. Nothing further needed at this time.

## 2015-12-10 NOTE — Telephone Encounter (Signed)
Discussed with Dr. Chase Caller - please stop metoprolol and increase Cardizem to '180mg'$  daily for afib suppression.  Please have her seen in office by extender in 2 weeks for followup

## 2015-12-10 NOTE — Progress Notes (Signed)
Subjective:     Patient ID: Patricia [name], female   DOB: 1940/07/06, 75 y.o.   MRN: [id number]  HPI  PCP Annye Asa, MD  Onc - Dr [name] [facility] [place]  HPI  IOV 10/24/2015  Chief Complaint  Patient presents with  . Pulmonary Consult    Pt referred by Dr. Radford Pax for abnormal PFT. Pt has occassional SOB with activity, cough with little mucus production - clear in color. Pt denies CP/tightness and f/c/s.    Patricia [name] is a 75 year old female with history of breast cancer status post left lumpectomy April 2016 stage IA. Status post urinary resection followed by radiation completing 11/22/2014. Last seen by oncology July 2017 and believed to be in complete remission on anastrozole which she is supposed to take to July 2020. She is also on tamoxifen denosumab/prlia. 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 She tells me for the last few to several years she's had chronic cough. Sometimes it is mild sometimes it is severe. It is improved overall. He can occasionally wake her up. It is mostly dry. There is no associated postnasal drainage or acid reflux. She does have significant shortness of breath for the last 1 year. It is on exertion moderate in severity. There is no associated chest pain or wheezing. Walking desaturation test 185 feet 3 laps on room air on a full head probe resting pulse ox was 97%. She dropped to 92% at the end. Also ox went from 60 at rest to 77/m.    Radiology review visualization of the images show in 2007 she had surgery in her lingula. Pathology shows that was done 06/19/2005 and it was a carcinoid. 2008 CT chest shows postop changes in the lingula but otherwise clear lung fields. However in June 2017 test etc. she seems to have increased interstitial infiltrates.   july 2017 Pulm function test: Restriction with low DLCO. FVC 1.5 L/71%, FEV1 1.28 L/81% ratio of 85/112%. Total lung capacity 3.48 L/83%. DLCO 11.3/69%  She did get started on amiodarone  which she believes was after the pulmonary function test. It was then. Within 2 weeks.    has a past medical history of Allergic rhinitis, cause unspecified; Aortic regurgitation (06/17/2015); Arthritis; Benign essential HTN; Cancer (Patricia [name]); Chronic cough; Chronic diastolic heart failure (Patricia [name]) (06/17/2015); Cough variant asthma; Disease of pharynx or nasopharynx; Diverticulosis; GERD (gastroesophageal reflux disease); Glaucoma; Hiatal hernia; Hyperlipidemia; Internal hemorrhoids; Laryngospasm; Laryngospasm; Multinodular goiter; Osteopenia; PAF (paroxysmal atrial fibrillation) ([place]); Postmenopausal; Radiation (10/22/14-11/23/14); and Stricture and stenosis of cervix.   reports that she quit smoking about 54 years ago. She has a 3.75 pack-year smoking history. She has never used smokeless tobacco.   ................ OV 12/10/2015  Chief Complaint  Patient presents with  . Follow-up    Pt here after CPST. Pt states her SOB is unchanged - at baseline. Pt denies significant cough, CP/tightness and f/c/s.    Dyspnea fu - review of results  High-resolution CT chest 10/29/2015: No evidence of interstitial lung disease  Pulmonary stress test 11/27/2015: She gave a very good effort.Marland Kitchen Respiratory exchange ratio 1.2 suggesting good effort There is a transient drop in pulse ox symmetry to 93% but was still adequate. She had a normal peak VO2 of 16.3 ML per kilogram per minute which is 96%. When adjusted for ideal body weight was 103% predicted suggesting her being overweight might be playing a role in dyspnea. The delta VO2/delta WR is low normal at 9.5 indicating possible mild cardiovascular impairment. The VE/VCO2  slope is significantly elevated increase dead space ventilation. Anaerobic threshold was normal. Patient reports 70% of maximum voluntary ventilation suggesting ventilatory limits were approaching her CO2 was low. There is no indication of exercise-induced. Resting ECG was normal sinus bradycardia with  T-wave abnormality heart rate response was markedly blunted blood pressure response was appropriate. She is noted to be on Lopressor for several years. Was on amio for 30 days only some 3 to 4 months ago   No cough anymore    has a past medical history of Allergic rhinitis, cause unspecified; Aortic regurgitation (06/17/2015); Arthritis; Benign essential HTN; Cancer ([place]); Chronic cough; Chronic diastolic heart failure (Patricia [name]) (06/17/2015); Cough variant asthma; Disease of pharynx or nasopharynx; Diverticulosis; GERD (gastroesophageal reflux disease); Glaucoma; Hiatal hernia; Hyperlipidemia; Internal hemorrhoids; Laryngospasm; Laryngospasm; Multinodular goiter; Osteopenia; PAF (paroxysmal atrial fibrillation) (Patricia [name]); Postmenopausal; Radiation (10/22/14-11/23/14); and Stricture and stenosis of cervix.   reports that she quit smoking about 54 years ago. She has a 3.75 pack-year smoking history. She has never used smokeless tobacco.  Past Surgical History:  Procedure Laterality Date  . BREAST SURGERY    . CARDIOVERSION N/A 06/24/2015   Procedure: CARDIOVERSION;  Surgeon: Sueanne Margarita, MD;  [facility];  Service: Cardiovascular;  Laterality: N/A;  . COLONOSCOPY    . LUNG CANCER SURGERY  2003   resection carcinoid lingula-lt upper lobe  . RADIOACTIVE SEED GUIDED MASTECTOMY WITH AXILLARY SENTINEL LYMPH NODE BIOPSY Left 06/26/2014   Procedure: RADIOACTIVE SEED GUIDED PARTIAL MASTECTOMY WITH AXILLARY SENTINEL LYMPH NODE BIOPSY;  Surgeon: Stark Klein, MD;  Location: [facility];  Service: General;  Laterality: Left;  . RE-EXCISION OF BREAST LUMPECTOMY Left 08/07/2014   Procedure: RE-EXCISION OF LEFT BREAST LUMPECTOMY;  Surgeon: Stark Klein, MD;  Location: [facility];  Service: General;  Laterality: Left;    Allergies  Allergen Reactions  . Combigan [Brimonidine Tartrate-Timolol] Itching    Eyes itch, reddened  . Other Other (See Comments)    Per patient made OU  red, Sore, and sensitivity to light  . Sulfamethoxazole-Trimethoprim Hives  . Sulfa Antibiotics Rash    Immunization History  Administered Date(s) Administered  . Hepatitis A 10/01/2015  . Hepatitis A, Adult 12/29/2012  . Influenza Split 12/11/2010, 12/01/2011  . Influenza Whole 01/21/2007, 01/01/2009, 11/28/2009  . Influenza, High Dose Seasonal PF 12/01/2013, 12/14/2014, 12/02/2015  . Influenza,inj,Quad PF,36+ Mos 12/29/2012, 12/01/2013  . Pneumococcal Conjugate-13 02/08/2014  . Pneumococcal Polysaccharide-23 03/02/2005, 01/26/2011  . Td 03/02/2000, 12/29/2012  . Typhoid Inactivated 10/01/2015  . Yellow Fever 10/01/2015  . Zoster 05/03/2006    Family History  Problem Relation Age of Onset  . Stroke Mother   . Hypertension Mother   . Hyperlipidemia Mother   . Stroke Father   . Transient ischemic attack Father   . Hyperlipidemia Father   . Hypertension Father   . Atrial fibrillation Son   . Heart attack Neg Hx      Current Outpatient Prescriptions:  .  ADVAIR HFA 725-36 MCG/ACT inhaler, USE 2 INHALATIONS TWICE A DAY, Disp: 36 g, Rfl: 1 .  albuterol (PROVENTIL HFA;VENTOLIN HFA) 108 (90 Base) MCG/ACT inhaler, Inhale 2 puffs into the lungs every 6 (six) hours as needed for wheezing or shortness of breath., Disp: , Rfl:  .  anastrozole (ARIMIDEX) 1 MG tablet, Take 1 tablet (1 mg total) by mouth daily., Disp: 90 tablet, Rfl: 3 .  apixaban (ELIQUIS) 5 MG TABS tablet, Take 1 tablet (5 mg total) by mouth 2 (two) times daily.,  Disp: 180 tablet, Rfl: 3 .  ARMOUR THYROID 15 MG tablet, TAKE 1 TABLET DAILY, Disp: 90 tablet, Rfl: 1 .  atorvastatin (LIPITOR) 40 MG tablet, Take 1 tablet (40 mg total) by mouth daily., Disp: 90 tablet, Rfl: 3 .  bimatoprost (LUMIGAN) 0.03 % ophthalmic drops, Place 1 drop into both eyes at bedtime. , Disp: , Rfl:  .  dorzolamide (TRUSOPT) 2 % ophthalmic solution, Place 1 drop into both eyes 2 (two) times daily., Disp: , Rfl:  .  furosemide (LASIX) 20 MG  tablet, Take 1 tablet (20 mg total) by mouth as directed. Take 2 tabs in the morning and 1 tab in the pm, Disp: 270 tablet, Rfl: 3 .  gabapentin (NEURONTIN) 400 MG capsule, Take 1 capsule (400 mg total) by mouth 2 (two) times daily., Disp: 180 capsule, Rfl: 1 .  loratadine (CLARITIN) 10 MG tablet, Take 10 mg by mouth daily as needed for allergies. Reported on 09/16/2015, Disp: , Rfl:  .  metoprolol tartrate (LOPRESSOR) 25 MG tablet, Take 1 tablet (25 mg total) by mouth 2 (two) times daily., Disp: 20 tablet, Rfl: 0 .  Multiple Vitamins-Iron (MULTIVITAMIN/IRON) TABS, Take 1 tablet by mouth daily. , Disp: , Rfl:  .  omeprazole (PRILOSEC) 20 MG capsule, TAKE 1 CAPSULE DAILY 30 MINUTES BEFORE A MEAL, Disp: 90 capsule, Rfl: 1 .  traMADol (ULTRAM) 50 MG tablet, Take 1 tablet (50 mg total) by mouth every 6 (six) hours as needed for moderate pain., Disp: 90 tablet, Rfl: 0   Review of Systems       Objective:   Physical Exam   Vitals:   12/10/15 0901  BP: 112/68  Pulse: 63  SpO2: 97%  Weight: 138 lb (62.6 kg)  Height: '4\' 11"'$  (1.499 m)    Estimated body mass index is 27.87 kg/m as calculated from the following:   Height as of this encounter: '4\' 11"'$  (1.499 m).   Weight as of this encounter: 138 lb (62.6 kg).  Discussion only visit    Assessment:       ICD-9-CM ICD-10-CM   1. Dyspnea and respiratory abnormality 786.09 R06.00     R06.89        Plan:     The principal finding here is significant bradycardia which could be contributing to dyspnea.. So step one would be to try to reduce Lopressor and see if dyspnea improves. We have to keep in mind the mild desaturation although still adequate that takes place. This would suggest possible pulmonary vascular etiology. Therefore second step would be to consider right heart cath versus cardiac stress test versus both  I've explained this to the patient.  I'll send my message to Dr. Dineen Kid I will update Dr. Sheliah Hatch   (> 50% of  this 15 min visit spent in face to face counseling or/and coordination of care) Noisy feeling well   Dr. Brand Males, M.D., [facility].C.P Pulmonary and Critical Care Medicine Staff Physician [place] [facility] Pager: [phone], If no answer or between  15:00h - 7:00h: call [phone]  12/10/2015 9:27 AM

## 2015-12-10 NOTE — Telephone Encounter (Signed)
Stop metoprolol and restart Cardizem CD at '120mg'$  daily

## 2015-12-10 NOTE — Telephone Encounter (Signed)
Cardizem was stopped at [place] 9/25. To Dr. Radford Pax for medication clarification.

## 2015-12-10 NOTE — Telephone Encounter (Signed)
Hi Patricia [name]  Multiple findings on her CPST but as step 1 - ? Possible to reduce lopressor. IF this does not help dyspnea, then right heart cath   Thanks  Dr. Brand Males, M.D., [facility].C.P Pulmonary and Critical Care Medicine Staff Physician [place] [facility] Pager: [phone], If no answer or between  15:00h - 7:00h: call [phone]  12/10/2015 9:29 AM

## 2015-12-11 ENCOUNTER — Encounter: Payer: Self-pay | Admitting: Cardiology

## 2015-12-11 MED ORDER — DILTIAZEM HCL ER COATED BEADS 120 MG PO CP24: 120.0000 mg | ORAL_CAPSULE | Freq: Every day | ORAL | 3 refills | Status: DC

## 2015-12-11 NOTE — Telephone Encounter (Signed)
Instructed patient to STOP METOPROLOL and START CARDIZEM CD 120 mg daily. OV scheduled 10/27 with Dayna Dunn. Patient agrees with treatment plan.

## 2015-12-11 NOTE — Telephone Encounter (Signed)
This encounter was created in error - please disregard.

## 2015-12-11 NOTE — Telephone Encounter (Signed)
Follow Up:; ° ° °Returning your call. °

## 2015-12-11 NOTE — Telephone Encounter (Signed)
Left message to call back  

## 2015-12-13 ENCOUNTER — Encounter: Payer: Self-pay | Admitting: General Practice

## 2015-12-13 MED ORDER — ATOVAQUONE-PROGUANIL HCL 250-100 MG PO TABS: 1.0000 | ORAL_TABLET | Freq: Every day | ORAL | 0 refills | Status: DC

## 2015-12-13 NOTE — Telephone Encounter (Signed)
We will start Atovaquone-Proguanil 1 tab daily starting 2 days prior to departure and extending through trip and ending 7 days after her return.  # 30, no refills

## 2015-12-13 NOTE — Telephone Encounter (Signed)
The 250/100...Marland Kitchen The other dose is the peds formulation

## 2015-12-13 NOTE — Addendum Note (Signed)
Addended by: Davis Gourd on: 12/13/2015 09:37 AM   Modules accepted: Orders

## 2015-12-13 NOTE — Telephone Encounter (Signed)
Can you please advise on the dose? One is 250-100 and the other is 62.5-25

## 2015-12-13 NOTE — Telephone Encounter (Signed)
Pt informed on mychart that medication was sent to pharmacy and of directions for use.

## 2015-12-19 ENCOUNTER — Encounter: Payer: Self-pay | Admitting: Physician Assistant

## 2015-12-20 DIAGNOSIS — H401133 Primary open-angle glaucoma, bilateral, severe stage: Secondary | ICD-10-CM | POA: Diagnosis not present

## 2015-12-27 ENCOUNTER — Encounter: Payer: Self-pay | Admitting: Physician Assistant

## 2015-12-27 ENCOUNTER — Ambulatory Visit (INDEPENDENT_AMBULATORY_CARE_PROVIDER_SITE_OTHER): Payer: Medicare Other | Admitting: Physician Assistant

## 2015-12-27 VITALS — BP 128/74 | HR 69 | Ht 59.0 in | Wt 139.0 lb

## 2015-12-27 DIAGNOSIS — I10 Essential (primary) hypertension: Secondary | ICD-10-CM | POA: Diagnosis not present

## 2015-12-27 DIAGNOSIS — R0602 Shortness of breath: Secondary | ICD-10-CM | POA: Diagnosis not present

## 2015-12-27 DIAGNOSIS — I5032 Chronic diastolic (congestive) heart failure: Secondary | ICD-10-CM | POA: Diagnosis not present

## 2015-12-27 DIAGNOSIS — I4819 Other persistent atrial fibrillation: Secondary | ICD-10-CM

## 2015-12-27 DIAGNOSIS — I481 Persistent atrial fibrillation: Secondary | ICD-10-CM

## 2015-12-27 DIAGNOSIS — I639 Cerebral infarction, unspecified: Secondary | ICD-10-CM

## 2015-12-27 NOTE — Patient Instructions (Signed)
Medication Instructions:   Your physician recommends that you continue on your current medications as directed. Please refer to the Current Medication list given to you today.    If you need a refill on your cardiac medications before your next appointment, please call your pharmacy.  Labwork: NONE ORDERED  TODAY    Testing/Procedures: NONE ORDERED  TODAY    Follow-Up: IN 3 MONTHS WITH DR TURNER..   Any Other Special Instructions Will Be Listed Below (If Applicable).

## 2015-12-27 NOTE — Progress Notes (Signed)
Cardiology Office Note    Date:  12/27/2015  ID:  Patricia [name], DOB 01/07/1941, MRN [id number] PCP:  Annye Asa, MD  Cardiologist:  Radford Pax   Chief Complaint: SOB  History of Present Illness:  Patricia [name] is a 75 y.o. female with history of persistent atrial fibrillation, HTN, chronic diastolic CHF, cough variant asthma, hyperlipidemia, internal hemorrhoids, diverticulosis, left lingula carcinoid resection 2007, breast CA s/p lumpectomy/tamoxifen/XRT, stroke 04/2015, probable CKD Stage III (baseline 1.1-1.4) who presents for follow-up of dyspnea. She was diagnosed with afib at time of CVA in 04/2015 and was changed from aspirin to Eliquis. TSH and d-dimer wnl at that time. 2D Echo 04/2015: EF 55-60% , no GRWMA, mild-mod AI, trivial MR. Nuc 04/2015 was normal. She underwent DCCV to NSR 06/24/15. When Dr. Radford Pax saw her back in clinic she was back in afib so amiodarone '200mg'$  BID was started but subsequently stopped due to prolonged QTC. She has also had interim issues with chronic diastolic CHF. She has not wished to resume any AAD. She has been followed by the afib clinic and recommended to continue rate control only for now as the likelihood of maintaining SR without AAD would be low given her LA size. She was recently evaluated by pulm for progressive SOB and abnormal PFTs. CT chest 10/30/15 showed air trapping indicative of small airway disease, no evidence of fibrotic interstitial lung disease, aortic atherosclerosis. Dr. Golden Pop note indicates "Walking desaturation test 185 feet 3 laps on room air on a full head probe resting pulse ox was 97%. She dropped to 92% at the end. Also ox went from 60 at rest to 77/m." I think he meant pulse as his assessment was that he felt bradycardia could be contributing to dyspnea. He recommended to stop metoprolol. Per telephone notes, the patient was recommended to change to Cardizem '120mg'$  daily. Dr. Chase Caller felt that if this did not help dyspnea, to consider  R/LHC.  CP Exercise Test 11/27/15: Conclusion: Exercise testing with gas exchange demonstrates normal functional capacity when compared to matched sedentary norms. There is no indication of exercise-induced bronchospasm. Although normal functional capacity, patient appears at least mildly ventilatory limited as O2 sat gradually dropped to 93% at peak exercise with elevated VE/VCO2 slope, suggesting increased dead space ventialtion. Recommend considering evaluation for pulmonary vascular disease.  She returns to clinic today for follow. She is feeling much better. She has not noticed any recurrent DOE. She was able to work in the yard pruning bushes and pulling branches from 9:30-2:30 over the weekend and did not have any CP or DOE. She has also been able to pull her golf bag on the golf course without any problem. She is in NSR today.    Past Medical History:  Diagnosis Date  . Allergic rhinitis, cause unspecified   . Aortic regurgitation 06/17/2015   Mild to moderate AR by echo 04/2015  . 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 Chronic cough   . Chronic diastolic heart failure ([place]) 06/17/2015  . CKD (chronic kidney disease), stage III   . Cough variant asthma   . Disease of pharynx or nasopharynx   . Diverticulosis   . Essential hypertension   . GERD (gastroesophageal reflux disease)   . Glaucoma   . Hiatal hernia   . Hyperlipidemia   . Internal hemorrhoids   . Laryngospasm   . Multinodular goiter   . Osteopenia   .  Persistent atrial fibrillation ([place])    a. Dx 04/2015 at time of stroke. b. DCCV 06/2015 - did not hold. Amio started then stopped due to QT prolongation.  Marland Kitchen Postmenopausal   . Radiation 10/22/14-11/23/14   Left Breast  . Stricture and stenosis of cervix     Past Surgical History:  Procedure Laterality Date  . BREAST SURGERY    . CARDIOVERSION N/A 06/24/2015   Procedure: CARDIOVERSION;  Surgeon: Sueanne Margarita, MD;   [facility];  Service: Cardiovascular;  Laterality: N/A;  . COLONOSCOPY    . LUNG CANCER SURGERY  2003   resection carcinoid lingula-lt upper lobe  . RADIOACTIVE SEED GUIDED MASTECTOMY WITH AXILLARY SENTINEL LYMPH NODE BIOPSY Left 06/26/2014   Procedure: RADIOACTIVE SEED GUIDED PARTIAL MASTECTOMY WITH AXILLARY SENTINEL LYMPH NODE BIOPSY;  Surgeon: Stark Klein, MD;  Location: [facility];  Service: General;  Laterality: Left;  . RE-EXCISION OF BREAST LUMPECTOMY Left 08/07/2014   Procedure: RE-EXCISION OF LEFT BREAST LUMPECTOMY;  Surgeon: Stark Klein, MD;  Location: [facility];  Service: General;  Laterality: Left;    Current Medications: Current Outpatient Prescriptions  Medication Sig Dispense Refill  . ADVAIR HFA 230-21 MCG/ACT inhaler USE 2 INHALATIONS TWICE A DAY 36 g 1  . albuterol (PROVENTIL HFA;VENTOLIN HFA) 108 (90 Base) MCG/ACT inhaler Inhale 2 puffs into the lungs every 6 (six) hours as needed for wheezing or shortness of breath.    . anastrozole (ARIMIDEX) 1 MG tablet Take 1 tablet (1 mg total) by mouth daily. 90 tablet 3  . apixaban (ELIQUIS) 5 MG TABS tablet Take 1 tablet (5 mg total) by mouth 2 (two) times daily. 180 tablet 3  . ARMOUR THYROID 15 MG tablet TAKE 1 TABLET DAILY 90 tablet 1  . atorvastatin (LIPITOR) 40 MG tablet Take 1 tablet (40 mg total) by mouth daily. 90 tablet 3  . atovaquone-proguanil (MALARONE) 250-100 MG TABS tablet Take 1 tablet by mouth daily. Start medication 2 days before your trip, continue medication through your trip, and continue for 7 days after your trip. 30 tablet 0  . bimatoprost (LUMIGAN) 0.03 % ophthalmic drops Place 1 drop into both eyes at bedtime.     Marland Kitchen diltiazem (CARDIZEM CD) 120 MG 24 hr capsule Take 1 capsule (120 mg total) by mouth daily. 30 capsule 3  . dorzolamide (TRUSOPT) 2 % ophthalmic solution Place 1 drop into both eyes 2 (two) times daily.    . furosemide (LASIX) 20 MG tablet Take 1 tablet  (20 mg total) by mouth as directed. Take 2 tabs in the morning and 1 tab in the pm 270 tablet 3  . gabapentin (NEURONTIN) 400 MG capsule Take 1 capsule (400 mg total) by mouth 2 (two) times daily. 180 capsule 1  . loratadine (CLARITIN) 10 MG tablet Take 10 mg by mouth daily as needed for allergies. Reported on 09/16/2015    . Multiple Vitamins-Iron (MULTIVITAMIN/IRON) TABS Take 1 tablet by mouth daily.     Marland Kitchen omeprazole (PRILOSEC) 20 MG capsule TAKE 1 CAPSULE DAILY 30 MINUTES BEFORE A MEAL 90 capsule 1  . traMADol (ULTRAM) 50 MG tablet Take 1 tablet (50 mg total) by mouth every 6 (six) hours as needed for moderate pain. 90 tablet 0   No current facility-administered medications for this visit.      Allergies:   Combigan [brimonidine tartrate-timolol]; Other; Sulfamethoxazole-trimethoprim; and Sulfa antibiotics   Social History   Social History  . Marital status: Married  Spouse name: N/A  . Number of children: N/A  . Years of education: N/A   Social History Main Topics  . Smoking status: Former Smoker    Packs/day: 0.75    Years: 5.00    Quit date: 06/14/1961  . Smokeless tobacco: Never Used  . Alcohol use 0.0 oz/week     Comment: occasional wine  . Drug use: No  . Sexual activity: Not Asked   Other Topics Concern  . None   Social History Narrative   Retired. Lives with husband.      Family History:  The patient's family history includes Atrial fibrillation in her son; Hyperlipidemia in her father and mother; Hypertension in her father and mother; Stroke in her father and mother; Transient ischemic attack in her father.   ROS:   Please see the history of present illness.  All other systems are reviewed and otherwise negative.    PHYSICAL EXAM:   VS:  BP 128/74   Pulse 69   Ht '4\' 11"'$  (1.499 m)   Wt 139 lb (63 kg)   BMI 28.07 kg/m   BMI: Body mass index is 28.07 kg/m. GEN: Well nourished, well developed WF in no acute distress  HEENT: normocephalic,  atraumatic Neck: no JVD, carotid bruits, or masses Cardiac: RRR; no murmurs, rubs, or gallops, no edema  Respiratory:  clear to auscultation bilaterally, normal work of breathing GI: soft, nontender, nondistended, + BS MS: no deformity or atrophy  Skin: warm and dry, no rash Neuro:  Alert and Oriented x 3, Strength and sensation are intact, follows commands Psych: euthymic mood, full affect  Wt Readings from Last 3 Encounters:  12/27/15 139 lb (63 kg)  12/10/15 138 lb (62.6 kg)  12/09/15 137 lb 9.6 oz (62.4 kg)      Studies/Labs Reviewed:   EKG:  EKG was ordered today and personally reviewed by me and demonstrates NSR 69bpm, nonspecific T wave changes with TWI I, avL, flattening V3-V6  Recent Labs: 04/27/2015: TSH 2.802 08/25/2015: B Natriuretic Peptide 484.1 09/09/2015: Magnesium 2.1 09/24/2015: ALT 27; HGB 15.0; Platelets 218 10/07/2015: BUN 21; Creat 1.16; Potassium 3.7; Sodium 139   Lipid Panel    Component Value Date/Time   CHOL 126 09/09/2015 1031   TRIG 71 09/09/2015 1031   TRIG 52 01/13/2006 1012   HDL 58 09/09/2015 1031   CHOLHDL 2.2 09/09/2015 1031   VLDL 14 09/09/2015 1031   LDLCALC 54 09/09/2015 1031   LDLDIRECT 174.3 06/04/2009 0000    Additional studies/ records that were reviewed today include: Summarized above.    ASSESSMENT & PLAN:   1. Shortness of breath - improved with discontinuation of metoprolol and switch to diltiazem. Notably she is also in NSR today. Will continue current regimen as above. If she develops recurrent symptoms, will consider R/LHC as suggested by pulmonology but for now the medication switch seems to have helped. 2. Persistent atrial fib - maintaining NSR today on diltiazem. Continue Eliquis. No longer on amiodarone due to QT prolongation. 3. Chronic diastolic CHF - appears euvolemic. Reviewed low sodium diet, daily weights, and importance of BP control. 4. Essential HTN - controlled.  Disposition: F/u with Dr. Radford Pax in 3  months.   Medication Adjustments/Labs and Tests Ordered: Current medicines are reviewed at length with the patient today.  Concerns regarding medicines are outlined above. Medication changes, Labs and Tests ordered today are summarized above and listed in the Patient Instructions accessible in Encounters.   Signed, Melina Copa PA-C  12/27/2015  2:54 PM    [facility] [place], [place], [place]  [address] Phone: [phone]; Fax: [phone]

## 2016-02-03 ENCOUNTER — Telehealth: Payer: Self-pay | Admitting: Cardiology

## 2016-02-03 NOTE — Telephone Encounter (Signed)
The patient states Dr. [name] instructed her to stop Cardizem about 3-4 weeks ago. MD did not give her a reason. She states her BP is "fine," usually around 125/70. She reports she feels like her heart is in a regular rhythm.  She is taking all other medications as directed. She understands she will be called if Dr. Radford Pax has any further recommendations.   Med list updated.

## 2016-02-03 NOTE — Telephone Encounter (Signed)
Patricia [name] is calling because she is wanting the Diltiazem removed from her medication list. She no longer takes this medication . Please call if you have any questions .Marland Kitchen Thanks

## 2016-02-06 ENCOUNTER — Ambulatory Visit (HOSPITAL_COMMUNITY)
Admission: RE | Admit: 2016-02-06 | Discharge: 2016-02-06 | Disposition: A | Discharge status: Home / Self Care | Payer: Medicare Other | Source: Ambulatory Visit | Attending: Nurse Practitioner | Admitting: Nurse Practitioner

## 2016-02-06 ENCOUNTER — Ambulatory Visit (HOSPITAL_COMMUNITY): Payer: Self-pay | Admitting: Nurse Practitioner

## 2016-02-06 ENCOUNTER — Encounter (HOSPITAL_COMMUNITY): Payer: Self-pay | Admitting: Nurse Practitioner

## 2016-02-06 VITALS — BP 132/88 | HR 69 | Ht 59.0 in | Wt 149.0 lb

## 2016-02-06 DIAGNOSIS — K219 Gastro-esophageal reflux disease without esophagitis: Secondary | ICD-10-CM | POA: Insufficient documentation

## 2016-02-06 DIAGNOSIS — R05 Cough: Secondary | ICD-10-CM | POA: Diagnosis not present

## 2016-02-06 DIAGNOSIS — N183 Chronic kidney disease, stage 3 (moderate): Secondary | ICD-10-CM | POA: Insufficient documentation

## 2016-02-06 DIAGNOSIS — M19041 Primary osteoarthritis, right hand: Secondary | ICD-10-CM | POA: Diagnosis not present

## 2016-02-06 DIAGNOSIS — I481 Persistent atrial fibrillation: Secondary | ICD-10-CM | POA: Diagnosis not present

## 2016-02-06 DIAGNOSIS — J309 Allergic rhinitis, unspecified: Secondary | ICD-10-CM | POA: Insufficient documentation

## 2016-02-06 DIAGNOSIS — Z8673 Personal history of transient ischemic attack (TIA), and cerebral infarction without residual deficits: Secondary | ICD-10-CM | POA: Diagnosis not present

## 2016-02-06 DIAGNOSIS — Z9889 Other specified postprocedural states: Secondary | ICD-10-CM | POA: Insufficient documentation

## 2016-02-06 DIAGNOSIS — E042 Nontoxic multinodular goiter: Secondary | ICD-10-CM | POA: Diagnosis not present

## 2016-02-06 DIAGNOSIS — Z87891 Personal history of nicotine dependence: Secondary | ICD-10-CM | POA: Diagnosis not present

## 2016-02-06 DIAGNOSIS — I480 Paroxysmal atrial fibrillation: Secondary | ICD-10-CM

## 2016-02-06 DIAGNOSIS — M19042 Primary osteoarthritis, left hand: Secondary | ICD-10-CM | POA: Diagnosis not present

## 2016-02-06 DIAGNOSIS — K5790 Diverticulosis of intestine, part unspecified, without perforation or abscess without bleeding: Secondary | ICD-10-CM | POA: Insufficient documentation

## 2016-02-06 DIAGNOSIS — I351 Nonrheumatic aortic (valve) insufficiency: Secondary | ICD-10-CM | POA: Diagnosis not present

## 2016-02-06 DIAGNOSIS — H409 Unspecified glaucoma: Secondary | ICD-10-CM | POA: Diagnosis not present

## 2016-02-06 DIAGNOSIS — I130 Hypertensive heart and chronic kidney disease with heart failure and stage 1 through stage 4 chronic kidney disease, or unspecified chronic kidney disease: Secondary | ICD-10-CM | POA: Diagnosis not present

## 2016-02-06 DIAGNOSIS — K648 Other hemorrhoids: Secondary | ICD-10-CM | POA: Diagnosis not present

## 2016-02-06 DIAGNOSIS — E785 Hyperlipidemia, unspecified: Secondary | ICD-10-CM | POA: Insufficient documentation

## 2016-02-06 DIAGNOSIS — I5032 Chronic diastolic (congestive) heart failure: Secondary | ICD-10-CM | POA: Insufficient documentation

## 2016-02-06 DIAGNOSIS — M8580 Other specified disorders of bone density and structure, unspecified site: Secondary | ICD-10-CM | POA: Insufficient documentation

## 2016-02-06 DIAGNOSIS — Z7901 Long term (current) use of anticoagulants: Secondary | ICD-10-CM | POA: Insufficient documentation

## 2016-02-06 DIAGNOSIS — K449 Diaphragmatic hernia without obstruction or gangrene: Secondary | ICD-10-CM | POA: Diagnosis not present

## 2016-02-06 MED ORDER — DILTIAZEM HCL 30 MG PO TABS: ORAL_TABLET | ORAL | 3 refills | Status: DC

## 2016-02-06 NOTE — Progress Notes (Signed)
Primary Care Physician: Patricia Asa, MD Cardiologist: [name] is a 75 y.o. female with a h/o persisitent afib that is for f/u in the afib clinic. Since stopping amiodarone for prolonged QT, she has not had any further afib. She feels well. She will be cruising  down the [employer] [place] for  3 weeks, leaving soon.Marland Kitchen She will be given cardizem 30 mg to use if she has afib on her trip since she is not on any rate control. Triggers discussed.   Today, she denies symptoms of palpitations, chest pain, shortness of breath, orthopnea, PND, lower extremity edema, dizziness, presyncope, syncope, or neurologic sequela. The patient is tolerating medications without difficulties and is otherwise without complaint today.   Past Medical History:  Diagnosis Date  . Allergic rhinitis, cause unspecified   . Aortic regurgitation 06/17/2015   Mild to moderate AR by echo 04/2015  . 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 Chronic cough   . Chronic diastolic heart failure ([place]) 06/17/2015  . CKD (chronic kidney disease), stage III   . Cough variant asthma   . Disease of pharynx or nasopharynx   . Diverticulosis   . Essential hypertension   . GERD (gastroesophageal reflux disease)   . Glaucoma   . Hiatal hernia   . Hyperlipidemia   . Internal hemorrhoids   . Laryngospasm   . Multinodular goiter   . Osteopenia   . Persistent atrial fibrillation ([place])    a. Dx 04/2015 at time of stroke. b. DCCV 06/2015 - did not hold. Amio started then stopped due to QT prolongation.  Marland Kitchen Postmenopausal   . Radiation 10/22/14-11/23/14   Left Breast  . Stricture and stenosis of cervix    Past Surgical History:  Procedure Laterality Date  . BREAST SURGERY    . CARDIOVERSION N/A 06/24/2015   Procedure: CARDIOVERSION;  Surgeon: Sueanne Margarita, MD;  [facility];  Service: Cardiovascular;  Laterality: N/A;  . COLONOSCOPY    . LUNG CANCER SURGERY  2003   resection carcinoid lingula-lt upper lobe  . RADIOACTIVE SEED GUIDED MASTECTOMY WITH AXILLARY SENTINEL LYMPH NODE BIOPSY Left 06/26/2014   Procedure: RADIOACTIVE SEED GUIDED PARTIAL MASTECTOMY WITH AXILLARY SENTINEL LYMPH NODE BIOPSY;  Surgeon: Stark Klein, MD;  Location: [place];  Service: General;  Laterality: Left;  . RE-EXCISION OF BREAST LUMPECTOMY Left 08/07/2014   Procedure: RE-EXCISION OF LEFT BREAST LUMPECTOMY;  Surgeon: Stark Klein, MD;  Location: [place];  Service: General;  Laterality: Left;    Current Outpatient Prescriptions  Medication Sig Dispense Refill  . ADVAIR HFA 230-21 MCG/ACT inhaler USE 2 INHALATIONS TWICE A DAY 36 g 1  . albuterol (PROVENTIL HFA;VENTOLIN HFA) 108 (90 Base) MCG/ACT inhaler Inhale 2 puffs into the lungs every 6 (six) hours as needed for wheezing or shortness of breath.    . anastrozole (ARIMIDEX) 1 MG tablet Take 1 tablet (1 mg total) by mouth daily. 90 tablet 3  . apixaban (ELIQUIS) 5 MG TABS tablet Take 1 tablet (5 mg total) by mouth 2 (two) times daily. 180 tablet 3  . ARMOUR THYROID 15 MG tablet TAKE 1 TABLET DAILY 90 tablet 1  . atorvastatin (LIPITOR) 40 MG tablet Take 1 tablet (40 mg total) by mouth daily. 90 tablet 3  . bimatoprost (LUMIGAN) 0.03 % ophthalmic drops Place 1 drop into both eyes at bedtime.     Marland Kitchen  dorzolamide (TRUSOPT) 2 % ophthalmic solution Place 1 drop into both eyes 2 (two) times daily.    . furosemide (LASIX) 20 MG tablet Take 1 tablet (20 mg total) by mouth as directed. Take 2 tabs in the morning and 1 tab in the pm 270 tablet 3  . gabapentin (NEURONTIN) 400 MG capsule Take 1 capsule (400 mg total) by mouth 2 (two) times daily. 180 capsule 1  . loratadine (CLARITIN) 10 MG tablet Take 10 mg by mouth daily as needed for allergies. Reported on 09/16/2015    . Multiple Vitamins-Iron (MULTIVITAMIN/IRON) TABS Take 1 tablet by mouth daily.     Marland Kitchen omeprazole (PRILOSEC) 20 MG capsule TAKE 1 CAPSULE DAILY 30  MINUTES BEFORE A MEAL 90 capsule 1  . traMADol (ULTRAM) 50 MG tablet Take 1 tablet (50 mg total) by mouth every 6 (six) hours as needed for moderate pain. 90 tablet 0  . atovaquone-proguanil (MALARONE) 250-100 MG TABS tablet Take 1 tablet by mouth daily. Start medication 2 days before your trip, continue medication through your trip, and continue for 7 days after your trip. (Patient not taking: Reported on 02/06/2016) 30 tablet 0  . diltiazem (CARDIZEM) 30 MG tablet Take 1 tablet every 4 hours AS NEEDED for fasting heart rate >100 as long as blood pressure >100. 45 tablet 3   No current facility-administered medications for this encounter.     Allergies  Allergen Reactions  . Combigan [Brimonidine Tartrate-Timolol] Itching    Eyes itch, reddened  . Other Other (See Comments)    Per patient made OU red, Sore, and sensitivity to light  . Sulfamethoxazole-Trimethoprim Hives  . Sulfa Antibiotics Rash    Social History   Social History  . Marital status: Married    Spouse name: N/A  . Number of children: N/A  . Years of education: N/A   Occupational History  . Not on file.   Social History Main Topics  . Smoking status: Former Smoker    Packs/day: 0.75    Years: 5.00    Quit date: 06/14/1961  . Smokeless tobacco: Never Used  . Alcohol use 0.0 oz/week     Comment: occasional wine  . Drug use: No  . Sexual activity: Not on file   Other Topics Concern  . Not on file   Social History Narrative   Retired. Lives with husband.     Family History  Problem Relation Age of Onset  . Stroke Mother   . Hypertension Mother   . Hyperlipidemia Mother   . Stroke Father   . Transient ischemic attack Father   . Hyperlipidemia Father   . Hypertension Father   . Atrial fibrillation Son   . Heart attack Neg Hx     ROS- All systems are reviewed and negative except as per the HPI above  Physical Exam: Vitals:   02/06/16 0920  BP: 132/88  Pulse: 69  Weight: 149 lb (67.6 kg)    Height: '4\' 11"'$  (1.499 m)   Wt Readings from Last 3 Encounters:  02/06/16 149 lb (67.6 kg)  12/27/15 139 lb (63 kg)  12/10/15 138 lb (62.6 kg)    Labs: Lab Results  Component Value Date   NA 139 10/07/2015   K 3.7 10/07/2015   CL 105 10/07/2015   CO2 27 10/07/2015   GLUCOSE 88 10/07/2015   BUN 21 10/07/2015   CREATININE 1.16 (H) 10/07/2015   CALCIUM 8.4 (L) 10/07/2015   MG 2.1 09/09/2015   Lab Results  Component Value Date   INR 1.41 06/17/2015   Lab Results  Component Value Date   CHOL 126 09/09/2015   HDL 58 09/09/2015   LDLCALC 54 09/09/2015   TRIG 71 09/09/2015     GEN- The patient is well appearing, alert and oriented x 3 today.   Head- normocephalic, atraumatic Eyes-  Sclera clear, conjunctiva pink Ears- hearing intact Oropharynx- clear Neck- supple, no JVP Lymph- no cervical lymphadenopathy Lungs- Clear to ausculation bilaterally, normal work of breathing Heart- Regular rate and rhythm, no murmurs, rubs or gallops, PMI not laterally displaced GI- soft, NT, ND, + BS Extremities- no clubbing, cyanosis, or edema MS- no significant deformity or atrophy Skin- no rash or lesion Psych- euthymic mood, full affect Neuro- strength and sensation are intact  EKG-NSR ar 69 bpm, Pr int 148 ms, qrs itn 74 ms, qtc 409 ms Epic records reviewed    Assessment and Plan: 1. PAF Doing well off amiodarone and staying in SR Given rx for pill in pocket cardizem 30 mg q 4 hours as needed  with instructions for use, if has afib on trip  Continue on eliquis 5 mg bid  F/u with Dr. Radford Pax 03/26/16 afib clinic as needed  Butch Penny C. Alix Stowers, [facility] [address] [place], [place] [address] [phone]

## 2016-02-10 ENCOUNTER — Other Ambulatory Visit: Payer: Self-pay | Admitting: Family Medicine

## 2016-03-19 ENCOUNTER — Ambulatory Visit: Payer: Medicare Other | Admitting: Nurse Practitioner

## 2016-03-20 ENCOUNTER — Ambulatory Visit (HOSPITAL_COMMUNITY): Payer: Medicare Other | Admitting: Nurse Practitioner

## 2016-03-23 ENCOUNTER — Other Ambulatory Visit: Payer: Self-pay | Admitting: *Deleted

## 2016-03-23 DIAGNOSIS — C50412 Malignant neoplasm of upper-outer quadrant of left female breast: Secondary | ICD-10-CM

## 2016-03-24 ENCOUNTER — Other Ambulatory Visit: Payer: Self-pay | Admitting: *Deleted

## 2016-03-24 ENCOUNTER — Other Ambulatory Visit: Payer: Self-pay | Admitting: Oncology

## 2016-03-24 ENCOUNTER — Other Ambulatory Visit (HOSPITAL_BASED_OUTPATIENT_CLINIC_OR_DEPARTMENT_OTHER): Payer: Medicare Other

## 2016-03-24 ENCOUNTER — Ambulatory Visit (HOSPITAL_BASED_OUTPATIENT_CLINIC_OR_DEPARTMENT_OTHER): Payer: Medicare Other

## 2016-03-24 VITALS — BP 117/61 | HR 63 | Temp 98.0°F | Resp 18

## 2016-03-24 DIAGNOSIS — M8580 Other specified disorders of bone density and structure, unspecified site: Secondary | ICD-10-CM | POA: Diagnosis present

## 2016-03-24 DIAGNOSIS — C50412 Malignant neoplasm of upper-outer quadrant of left female breast: Secondary | ICD-10-CM

## 2016-03-24 LAB — CBC WITH DIFFERENTIAL/PLATELET
BASO%: 0.8 % (ref 0.0–2.0)
BASOS ABS: 0 10*3/uL (ref 0.0–0.1)
EOS%: 0.1 % (ref 0.0–7.0)
Eosinophils Absolute: 0 10*3/uL (ref 0.0–0.5)
HCT: 44.4 % (ref 34.8–46.6)
HGB: 15 g/dL (ref 11.6–15.9)
LYMPH%: 29.9 % (ref 14.0–49.7)
MCH: 32.5 pg (ref 25.1–34.0)
MCHC: 33.9 g/dL (ref 31.5–36.0)
MCV: 95.8 fL (ref 79.5–101.0)
MONO#: 0.4 10*3/uL (ref 0.1–0.9)
MONO%: 7.1 % (ref 0.0–14.0)
NEUT#: 3.6 10*3/uL (ref 1.5–6.5)
NEUT%: 62.1 % (ref 38.4–76.8)
Platelets: 261 10*3/uL (ref 145–400)
RBC: 4.63 10*6/uL (ref 3.70–5.45)
RDW: 12.6 % (ref 11.2–14.5)
WBC: 5.8 10*3/uL (ref 3.9–10.3)
lymph#: 1.7 10*3/uL (ref 0.9–3.3)

## 2016-03-24 LAB — COMPREHENSIVE METABOLIC PANEL
ALT: 17 U/L (ref 0–55)
AST: 18 U/L (ref 5–34)
Albumin: 3.8 g/dL (ref 3.5–5.0)
Alkaline Phosphatase: 76 U/L (ref 40–150)
Anion Gap: 8 mEq/L (ref 3–11)
BUN: 18.9 mg/dL (ref 7.0–26.0)
CALCIUM: 9.3 mg/dL (ref 8.4–10.4)
CHLORIDE: 105 meq/L (ref 98–109)
CO2: 28 meq/L (ref 22–29)
CREATININE: 1.1 mg/dL (ref 0.6–1.1)
EGFR: 51 mL/min/{1.73_m2} — AB (ref >90)
Glucose: 110 mg/dl (ref 70–140)
POTASSIUM: 3.4 meq/L — AB (ref 3.5–5.1)
Sodium: 141 mEq/L (ref 136–145)
Total Bilirubin: 0.9 mg/dL (ref 0.20–1.20)
Total Protein: 7.1 g/dL (ref 6.4–8.3)

## 2016-03-24 MED ORDER — DENOSUMAB 60 MG/ML ~~LOC~~ SOLN
60.0000 mg | Freq: Once | SUBCUTANEOUS | Status: AC
  Administered 2016-03-24: 60 mg via SUBCUTANEOUS
  Filled 2016-03-24: qty 1

## 2016-03-26 ENCOUNTER — Other Ambulatory Visit: Payer: Self-pay | Admitting: General Practice

## 2016-03-26 ENCOUNTER — Ambulatory Visit (INDEPENDENT_AMBULATORY_CARE_PROVIDER_SITE_OTHER): Payer: Medicare Other | Admitting: Cardiology

## 2016-03-26 ENCOUNTER — Encounter: Payer: Self-pay | Admitting: Cardiology

## 2016-03-26 VITALS — BP 122/70 | HR 70 | Ht 59.0 in | Wt 141.8 lb

## 2016-03-26 DIAGNOSIS — I4819 Other persistent atrial fibrillation: Secondary | ICD-10-CM

## 2016-03-26 DIAGNOSIS — I481 Persistent atrial fibrillation: Secondary | ICD-10-CM | POA: Diagnosis not present

## 2016-03-26 DIAGNOSIS — I351 Nonrheumatic aortic (valve) insufficiency: Secondary | ICD-10-CM

## 2016-03-26 DIAGNOSIS — I10 Essential (primary) hypertension: Secondary | ICD-10-CM | POA: Diagnosis not present

## 2016-03-26 DIAGNOSIS — I5032 Chronic diastolic (congestive) heart failure: Secondary | ICD-10-CM

## 2016-03-26 MED ORDER — POTASSIUM CHLORIDE CRYS ER 20 MEQ PO TBCR: 20.0000 meq | EXTENDED_RELEASE_TABLET | Freq: Every day | ORAL | 11 refills | Status: DC

## 2016-03-26 NOTE — Progress Notes (Signed)
Cardiology Office Note    Date:  03/26/2016   ID:  Patricia [name], DOB March 04, 1940, MRN [id number]  PCP:  Annye Asa, MD  Cardiologist:  Fransico Him, MD   Chief Complaint  Patient presents with  . Congestive Heart Failure  . Atrial Fibrillation  . Hypertension    History of Present Illness:  Patricia [name] is a 76 y.o. female  with a history of lung CA s/p resection (2003), breast CA s/p lumpectomy/Tamoxifen/XRT, GERD, hypothyroidism, HTN and HLD who presents for follow up. 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 She underwent DCCV to NSR on 06/24/2015. I saw her back in followup and she was back in atrial fibrillation. She was started on Amio '200mg'$  BID but this was subsequently stopped due to prolonged QTc.  She also had some problems with diastolic CHF and her Lasix was increased for a few days by Richardson Dopp, PA.  She was  seen in afib clinic and complained of chest heaviness and fatigue as well as SOB when in atrial fibrillation but nuclear stress test with no ischemia 04/2015.  She was not in afib on that OV.  The patient did not want to go back on AADs.  She is followed by Dr. Chase Caller for SOB which has now resolved and thought to be due to deconditioning.  She is now back for followup.  She denies any chest pain,SOB, DOE, palpitations, orthopnea, PND. No dizziness, LE edema or syncope. She has not had any further SOB and also has not had any further PAF.     Past Medical History:  Diagnosis Date  . Allergic rhinitis, cause unspecified   . Aortic regurgitation 06/17/2015   Mild to moderate AR by echo 04/2015  . 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 Cancer 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 Chronic cough   . Chronic diastolic heart failure ([place]) 06/17/2015  . CKD (chronic kidney disease), stage III   . Cough variant asthma   . Disease of pharynx or nasopharynx   .  Diverticulosis   . Essential hypertension   . GERD (gastroesophageal reflux disease)   . Glaucoma   . Hiatal hernia   . Hyperlipidemia   . Internal hemorrhoids   . Laryngospasm   . Multinodular goiter   . Osteopenia   . Persistent atrial fibrillation ([place])    a. Dx 04/2015 at time of stroke. b. DCCV 06/2015 - did not hold. Amio started then stopped due to QT prolongation.  Marland Kitchen Postmenopausal   . Radiation 10/22/14-11/23/14   Left Breast  . Stricture and stenosis of cervix     Past Surgical History:  Procedure Laterality Date  . BREAST SURGERY    . CARDIOVERSION N/A 06/24/2015   Procedure: CARDIOVERSION;  Surgeon: Sueanne Margarita, MD;  [facility];  Service: Cardiovascular;  Laterality: N/A;  . COLONOSCOPY    . LUNG CANCER SURGERY  2003   resection carcinoid lingula-lt upper lobe  . RADIOACTIVE SEED GUIDED MASTECTOMY WITH AXILLARY SENTINEL LYMPH NODE BIOPSY Left 06/26/2014   Procedure: RADIOACTIVE SEED GUIDED PARTIAL MASTECTOMY WITH AXILLARY SENTINEL LYMPH NODE BIOPSY;  Surgeon: Stark Klein, MD;  Location: [place];  Service: General;  Laterality: Left;  . RE-EXCISION OF BREAST LUMPECTOMY Left 08/07/2014   Procedure: RE-EXCISION OF LEFT BREAST LUMPECTOMY;  Surgeon: Stark Klein, MD;  Location: [facility];  Service: General;  Laterality: Left;    Current Medications: Outpatient Medications Prior to Visit  Medication Sig Dispense Refill  . ADVAIR HFA 230-21 MCG/ACT inhaler USE 2 INHALATIONS TWICE A DAY 36 g 1  . albuterol (PROVENTIL HFA;VENTOLIN HFA) 108 (90 Base) MCG/ACT inhaler Inhale 2 puffs into the lungs every 6 (six) hours as needed for wheezing or shortness of breath.    . anastrozole (ARIMIDEX) 1 MG tablet Take 1 tablet (1 mg total) by mouth daily. 90 tablet 3  . apixaban (ELIQUIS) 5 MG TABS tablet Take 1 tablet (5 mg total) by mouth 2 (two) times daily. 180 tablet 3  . ARMOUR THYROID 15 MG tablet TAKE 1 TABLET DAILY 90 tablet 1  .  atorvastatin (LIPITOR) 40 MG tablet Take 1 tablet (40 mg total) by mouth daily. 90 tablet 3  . bimatoprost (LUMIGAN) 0.03 % ophthalmic drops Place 1 drop into both eyes at bedtime.     Marland Kitchen diltiazem (CARDIZEM) 30 MG tablet Take 1 tablet every 4 hours AS NEEDED for fasting heart rate >100 as long as blood pressure >100. 45 tablet 3  . dorzolamide (TRUSOPT) 2 % ophthalmic solution Place 1 drop into both eyes 2 (two) times daily.    . furosemide (LASIX) 20 MG tablet Take 1 tablet (20 mg total) by mouth as directed. Take 2 tabs in the morning and 1 tab in the pm 270 tablet 3  . gabapentin (NEURONTIN) 400 MG capsule Take 1 capsule (400 mg total) by mouth 2 (two) times daily. 180 capsule 1  . loratadine (CLARITIN) 10 MG tablet Take 10 mg by mouth daily as needed for allergies. Reported on 09/16/2015    . Multiple Vitamins-Iron (MULTIVITAMIN/IRON) TABS Take 1 tablet by mouth daily.     Marland Kitchen omeprazole (PRILOSEC) 20 MG capsule TAKE 1 CAPSULE DAILY 30 MINUTES BEFORE A MEAL 90 capsule 1  . traMADol (ULTRAM) 50 MG tablet Take 1 tablet (50 mg total) by mouth every 6 (six) hours as needed for moderate pain. 90 tablet 0  . atovaquone-proguanil (MALARONE) 250-100 MG TABS tablet Take 1 tablet by mouth daily. Start medication 2 days before your trip, continue medication through your trip, and continue for 7 days after your trip. (Patient not taking: Reported on 02/06/2016) 30 tablet 0   No facility-administered medications prior to visit.      Allergies:   Combigan [brimonidine tartrate-timolol]; Other; Sulfamethoxazole-trimethoprim; and Sulfa antibiotics   Social History   Social History  . Marital status: Married    Spouse name: N/A  . Number of children: N/A  . Years of education: N/A   Social History Main Topics  . Smoking status: Former Smoker    Packs/day: 0.75    Years: 5.00    Quit date: 06/14/1961  . Smokeless tobacco: Never Used  . Alcohol use 0.0 oz/week     Comment: occasional wine  . Drug use:  No  . Sexual activity: Not Asked   Other Topics Concern  . None   Social History Narrative   Retired. Lives with husband.      Family History:  The patient's family history includes Atrial fibrillation in her son; Hyperlipidemia in her father and mother; Hypertension in her father and mother; Stroke in her father and mother; Transient ischemic attack in her father.   ROS:   Please see the history of present illness.    ROS All other systems reviewed and are negative.  No flowsheet data found.  PHYSICAL EXAM:   VS:  BP 122/70   Pulse 70   Ht '4\' 11"'$  (1.499 m)   Wt 141 lb 12.8 oz (64.3 kg)   BMI 28.64 kg/m    GEN: Well nourished, well developed, in no acute distress  HEENT: normal  Neck: no JVD, carotid bruits, or masses Cardiac: RRR; no murmurs, rubs, or gallops,no edema.  Intact distal pulses bilaterally.  Respiratory:  clear to auscultation bilaterally, normal work of breathing GI: soft, nontender, nondistended, + BS MS: no deformity or atrophy  Skin: warm and dry, no rash Neuro:  Alert and Oriented x 3, Strength and sensation are intact Psych: euthymic mood, full affect  Wt Readings from Last 3 Encounters:  03/26/16 141 lb 12.8 oz (64.3 kg)  02/06/16 149 lb (67.6 kg)  12/27/15 139 lb (63 kg)      Studies/Labs Reviewed:   EKG:  EKG is not ordered today.    Recent Labs: 04/27/2015: TSH 2.802 08/25/2015: B Natriuretic Peptide 484.1 09/09/2015: Magnesium 2.1 03/24/2016: ALT 17; BUN 18.9; Creatinine 1.1; HGB 15.0; Platelets 261; Potassium 3.4; Sodium 141   Lipid Panel    Component Value Date/Time   CHOL 126 09/09/2015 1031   TRIG 71 09/09/2015 1031   TRIG 52 01/13/2006 1012   HDL 58 09/09/2015 1031   CHOLHDL 2.2 09/09/2015 1031   VLDL 14 09/09/2015 1031   LDLCALC 54 09/09/2015 1031   LDLDIRECT 174.3 06/04/2009 0000    Additional studies/ records that were reviewed today include:  none    ASSESSMENT:    1. Chronic diastolic heart failure ([place])     2. Aortic valve insufficiency, etiology of cardiac valve disease unspecified   3. Benign essential HTN   4. Persistent atrial fibrillation ([facility])      PLAN:  In order of problems listed above:  1. Chronic diastolic CHF - she appears euvolemic on exam and weight is stable. Continue Lasix. Renal function stable but K was 3.4 on labs 1/23.  I will start her on Kdur 48mq daily and repeat BMET in 1 week. 2. Aortic insufficiency mild to moderate by echo 04/2015 - I will repeat echo 04/2016. 3. HTN - BP controlled on diet alone. 4. Persistent atrial fibrillation maintaining NSR off amio.  Continue CCB PRN for breakthrough.  Continue Eliquis.  Creatinine and CBC are normal.    Medication Adjustments/Labs and Tests Ordered: Current medicines are reviewed at length with the patient today.  Concerns regarding medicines are outlined above.  Medication changes, Labs and Tests ordered today are listed in the Patient Instructions below.  There are no Patient Instructions on file for this visit.   Signed, TFransico Him MD  03/26/2016 1:49 PM    [facility] [address]  [id number]: [phone] Fax: [phone]

## 2016-03-26 NOTE — Telephone Encounter (Signed)
Last OV 08/30/15 Tramadol last filled 11/25/15 #90 with 0

## 2016-03-26 NOTE — Patient Instructions (Signed)
Medication Instructions:  1) START KDUR 20 meq daily  Labwork: IN ONE WEEK: BMET  Testing/Procedures: Please reschedule your ECHO from March to February.  Follow-Up: Your physician wants you to follow-up in: 6 months with Dr. Radford Pax. You will receive a reminder letter in the mail two months in advance. If you don't receive a letter, please call our office to schedule the follow-up appointment.   Any Other Special Instructions Will Be Listed Below (If Applicable).     If you need a refill on your cardiac medications before your next appointment, please call your pharmacy.

## 2016-03-27 MED ORDER — TRAMADOL HCL 50 MG PO TABS: 50.0000 mg | ORAL_TABLET | Freq: Four times a day (QID) | ORAL | 0 refills | Status: DC | PRN

## 2016-03-27 NOTE — Telephone Encounter (Signed)
Medication filled to pharmacy as requested.   

## 2016-03-27 NOTE — Telephone Encounter (Signed)
[place] for refill, #90, 0 refills. Contract signed Database reviewed 03/27/16 Please verify w/ pt what she is taking this for so we can complete our documentation

## 2016-04-01 ENCOUNTER — Encounter: Payer: Self-pay | Admitting: Family Medicine

## 2016-04-01 ENCOUNTER — Ambulatory Visit (INDEPENDENT_AMBULATORY_CARE_PROVIDER_SITE_OTHER): Payer: Medicare Other | Admitting: Family Medicine

## 2016-04-01 VITALS — BP 110/70 | HR 80 | Temp 97.9°F | Resp 16 | Ht 59.0 in | Wt 142.1 lb

## 2016-04-01 DIAGNOSIS — E785 Hyperlipidemia, unspecified: Secondary | ICD-10-CM | POA: Diagnosis not present

## 2016-04-01 DIAGNOSIS — I481 Persistent atrial fibrillation: Secondary | ICD-10-CM

## 2016-04-01 DIAGNOSIS — Z0000 Encounter for general adult medical examination without abnormal findings: Secondary | ICD-10-CM

## 2016-04-01 DIAGNOSIS — C50412 Malignant neoplasm of upper-outer quadrant of left female breast: Secondary | ICD-10-CM

## 2016-04-01 DIAGNOSIS — I10 Essential (primary) hypertension: Secondary | ICD-10-CM | POA: Diagnosis not present

## 2016-04-01 DIAGNOSIS — E032 Hypothyroidism due to medicaments and other exogenous substances: Secondary | ICD-10-CM

## 2016-04-01 DIAGNOSIS — I4819 Other persistent atrial fibrillation: Secondary | ICD-10-CM

## 2016-04-01 DIAGNOSIS — M8580 Other specified disorders of bone density and structure, unspecified site: Secondary | ICD-10-CM

## 2016-04-01 LAB — CBC WITH DIFFERENTIAL/PLATELET
Basophils Absolute: 0 10*3/uL (ref 0.0–0.1)
Basophils Relative: 0.7 % (ref 0.0–3.0)
EOS PCT: 0.2 % (ref 0.0–5.0)
Eosinophils Absolute: 0 10*3/uL (ref 0.0–0.7)
HCT: 43 % (ref 36.0–46.0)
HEMOGLOBIN: 14.8 g/dL (ref 12.0–15.0)
Lymphocytes Relative: 25.2 % (ref 12.0–46.0)
Lymphs Abs: 1.7 10*3/uL (ref 0.7–4.0)
MCHC: 34.4 g/dL (ref 30.0–36.0)
MCV: 95.1 fl (ref 78.0–100.0)
MONO ABS: 0.5 10*3/uL (ref 0.1–1.0)
Monocytes Relative: 7.1 % (ref 3.0–12.0)
Neutro Abs: 4.4 10*3/uL (ref 1.4–7.7)
Neutrophils Relative %: 66.8 % (ref 43.0–77.0)
Platelets: 284 10*3/uL (ref 150.0–400.0)
RBC: 4.53 Mil/uL (ref 3.87–5.11)
RDW: 13 % (ref 11.5–15.5)
WBC: 6.6 10*3/uL (ref 4.0–10.5)

## 2016-04-01 LAB — LIPID PANEL
CHOL/HDL RATIO: 3
Cholesterol: 180 mg/dL (ref 0–200)
HDL: 67.2 mg/dL (ref >39.00)
LDL CALC: 94 mg/dL (ref 0–99)
NonHDL: 113.05
TRIGLYCERIDES: 96 mg/dL (ref 0.0–149.0)
VLDL: 19.2 mg/dL (ref 0.0–40.0)

## 2016-04-01 LAB — BASIC METABOLIC PANEL
BUN: 22 mg/dL (ref 6–23)
CALCIUM: 9.1 mg/dL (ref 8.4–10.5)
CHLORIDE: 104 meq/L (ref 96–112)
CO2: 30 mEq/L (ref 19–32)
CREATININE: 1 mg/dL (ref 0.40–1.20)
GFR: 57.38 mL/min — ABNORMAL LOW (ref >60.00)
Glucose, Bld: 109 mg/dL — ABNORMAL HIGH (ref 70–99)
Potassium: 4.5 mEq/L (ref 3.5–5.1)
Sodium: 140 mEq/L (ref 135–145)

## 2016-04-01 LAB — HEPATIC FUNCTION PANEL
ALBUMIN: 4.3 g/dL (ref 3.5–5.2)
ALT: 16 U/L (ref 0–35)
AST: 18 U/L (ref 0–37)
Alkaline Phosphatase: 70 U/L (ref 39–117)
Bilirubin, Direct: 0.2 mg/dL (ref 0.0–0.3)
Total Bilirubin: 0.9 mg/dL (ref 0.2–1.2)
Total Protein: 7 g/dL (ref 6.0–8.3)

## 2016-04-01 LAB — TSH: TSH: 19.27 u[IU]/mL — AB (ref 0.35–4.50)

## 2016-04-01 NOTE — Assessment & Plan Note (Signed)
Following w/ Dr [name].  Remains on Arimidex tx.

## 2016-04-01 NOTE — Assessment & Plan Note (Signed)
Chronic problem.  Well controlled today.  Asymptomatic.  Check labs.  No anticipated med changes.  Will follow. 

## 2016-04-01 NOTE — Assessment & Plan Note (Signed)
Chronic problem.  Currently asymptomatic.  Check labs.  Adjust meds prn  

## 2016-04-01 NOTE — Assessment & Plan Note (Signed)
Chronic problem.  Tolerating statin w/o difficulty.  Check labs.  Adjust meds prn  

## 2016-04-01 NOTE — Progress Notes (Signed)
   Subjective:    Patient ID: Patricia [name], female    DOB: January 18, 1941, 76 y.o.   MRN: [id number]  HPI Here today for CPE.  Risk Factors: HTN- chronic problem, on Lasix, Diltiazem w/ good control.  Denies CP, SOB, HAs, visual changes, edema. Hypothyroid- chronic problem, on Armour thyroid 24mg daily.  Denies fatigue, heat/cold intolerance, bowel changes. Hyperlipidemia- chronic problem, on Lipitor '40mg'$  daily.  Exercising regularly.  Denies abd pain, N/V, myalgias. Afib- on Eliquis, asymptomatic since cardioversion.  No CP, SOB, edema, palpitations Physical Activity: exercising regularly Fall Risk: low Depression: denies Hearing: normal to conversational tones and whispered voice ADL's: independent Cognitive: normal linear thought process, memory and attention intact Home Safety: safe at home Height, Weight, BMI, Visual Acuity: see vitals, vision corrected to 20/20 w/ glasses Counseling: UTD on colonoscopy.  [facility]  [facility] on immunizations Care team reviewed and updated w/ pt Labs Ordered: See A&P Care Plan: See A&P    Review of Systems Patient reports no vision/ hearing changes, adenopathy,fever, weight change,  persistant/recurrent hoarseness , swallowing issues, chest pain, palpitations, edema, persistant/recurrent cough, hemoptysis, dyspnea (rest/exertional/paroxysmal nocturnal), gastrointestinal bleeding (melena, rectal bleeding), abdominal pain, significant heartburn, bowel changes, GU symptoms (dysuria, hematuria, incontinence), Gyn symptoms (abnormal  bleeding, pain),  syncope, focal weakness, memory loss, numbness & tingling, skin/hair/nail changes, abnormal bruising or bleeding, anxiety, or depression.     Objective:   Physical Exam General Appearance:    Alert, cooperative, no distress, appears stated age  Head:    Normocephalic, without obvious abnormality, atraumatic  Eyes:    PERRL, conjunctiva/corneas clear, EOM's intact, fundi   benign, both eyes  Ears:    Normal TM's and external ear canals, both ears  Nose:   Nares normal, septum midline, mucosa normal, no drainage    or sinus tenderness  Throat:   Lips, mucosa, and tongue normal; teeth and gums normal  Neck:   Supple, symmetrical, trachea midline, no adenopathy;    Thyroid: no enlargement/tenderness/nodules  Back:     Symmetric, no curvature, ROM normal, no CVA tenderness  Lungs:     Clear to auscultation bilaterally, respirations unlabored  Chest Wall:    No tenderness or deformity   Heart:    Regular rate and rhythm, S1 and S2 normal, no murmur, rub   or gallop  Breast Exam:    Deferred to mammo  Abdomen:     Soft, non-tender, bowel sounds active all four quadrants,    no masses, no organomegaly  Genitalia:    Deferred  Rectal:    Extremities:   Extremities normal, atraumatic, no cyanosis or edema  Pulses:   2+ and symmetric all extremities  Skin:   Skin color, texture, turgor normal, no rashes or lesions  Lymph nodes:   Cervical, supraclavicular, and axillary nodes normal  Neurologic:   CNII-XII intact, normal strength, sensation and reflexes    throughout          Assessment & Plan:

## 2016-04-01 NOTE — Assessment & Plan Note (Signed)
Pt's PE WNL.  UTD on colonoscopy (due next year).  Overdue on mammo- pt has order entered by Oncology.  UTD on immunizations.  Written screening schedule updated and given to pt.  Check labs.  Anticipatory guidance provided.

## 2016-04-01 NOTE — Patient Instructions (Signed)
Follow up in 6 months to recheck BP and cholesterol We'll notify you of your lab results and make any changes if needed Continue to work on healthy diet and regular exercise- you look great! Please call [facility] and schedule your mammogram You are up to date on bone density, colonoscopy (due next year), immunizations- yay! Call with any questions or concerns Happy New Year!!!

## 2016-04-01 NOTE — Progress Notes (Signed)
Pre visit review using our clinic review tool, if applicable. No additional management support is needed unless otherwise documented below in the visit note. 

## 2016-04-02 ENCOUNTER — Other Ambulatory Visit: Payer: Self-pay | Admitting: General Practice

## 2016-04-02 DIAGNOSIS — E032 Hypothyroidism due to medicaments and other exogenous substances: Secondary | ICD-10-CM

## 2016-04-02 MED ORDER — THYROID 60 MG PO TABS: 60.0000 mg | ORAL_TABLET | Freq: Every day | ORAL | 1 refills | Status: DC

## 2016-04-09 ENCOUNTER — Ambulatory Visit (HOSPITAL_COMMUNITY): Payer: Medicare Other | Attending: Cardiology

## 2016-04-09 ENCOUNTER — Other Ambulatory Visit: Payer: Self-pay

## 2016-04-09 ENCOUNTER — Other Ambulatory Visit: Payer: Medicare Other | Admitting: *Deleted

## 2016-04-09 DIAGNOSIS — Z87891 Personal history of nicotine dependence: Secondary | ICD-10-CM | POA: Diagnosis not present

## 2016-04-09 DIAGNOSIS — I340 Nonrheumatic mitral (valve) insufficiency: Secondary | ICD-10-CM | POA: Insufficient documentation

## 2016-04-09 DIAGNOSIS — I5032 Chronic diastolic (congestive) heart failure: Secondary | ICD-10-CM

## 2016-04-09 DIAGNOSIS — I110 Hypertensive heart disease with heart failure: Secondary | ICD-10-CM | POA: Insufficient documentation

## 2016-04-09 DIAGNOSIS — I361 Nonrheumatic tricuspid (valve) insufficiency: Secondary | ICD-10-CM | POA: Insufficient documentation

## 2016-04-09 DIAGNOSIS — E785 Hyperlipidemia, unspecified: Secondary | ICD-10-CM | POA: Insufficient documentation

## 2016-04-09 DIAGNOSIS — I351 Nonrheumatic aortic (valve) insufficiency: Secondary | ICD-10-CM | POA: Diagnosis not present

## 2016-04-09 DIAGNOSIS — I509 Heart failure, unspecified: Secondary | ICD-10-CM | POA: Insufficient documentation

## 2016-04-10 LAB — BASIC METABOLIC PANEL
BUN/Creatinine Ratio: 20 (ref 12–28)
BUN: 19 mg/dL (ref 8–27)
CO2: 26 mmol/L (ref 18–29)
Calcium: 8.8 mg/dL (ref 8.7–10.3)
Chloride: 101 mmol/L (ref 96–106)
Creatinine, Ser: 0.95 mg/dL (ref 0.57–1.00)
GFR, EST AFRICAN AMERICAN: 68 mL/min/{1.73_m2} (ref >59)
GFR, EST NON AFRICAN AMERICAN: 59 mL/min/{1.73_m2} — AB (ref >59)
Glucose: 98 mg/dL (ref 65–99)
POTASSIUM: 3.7 mmol/L (ref 3.5–5.2)
SODIUM: 143 mmol/L (ref 134–144)

## 2016-04-16 ENCOUNTER — Ambulatory Visit (INDEPENDENT_AMBULATORY_CARE_PROVIDER_SITE_OTHER): Payer: Medicare Other | Admitting: Cardiology

## 2016-04-16 ENCOUNTER — Encounter: Payer: Self-pay | Admitting: Cardiology

## 2016-04-16 VITALS — BP 140/62 | HR 84 | Ht 59.0 in | Wt 138.8 lb

## 2016-04-16 DIAGNOSIS — I270 Primary pulmonary hypertension: Secondary | ICD-10-CM | POA: Diagnosis not present

## 2016-04-16 NOTE — Patient Instructions (Addendum)
Medication Instructions:  Your physician recommends that you continue on your current medications as directed. Please refer to the Current Medication list given to you today.   Labwork: Your physician recommends that you return for lab work on: 04/24/16 for CBC, BMET, PT/INR.   Testing/Procedures: Your physician has requested that you have a cardiac catheterization. Cardiac catheterization is used to diagnose and/or treat various heart conditions. Doctors may recommend this procedure for a number of different reasons. The most common reason is to evaluate chest pain. Chest pain can be a symptom of coronary artery disease (CAD), and cardiac catheterization can show whether plaque is narrowing or blocking your heart's arteries. This procedure is also used to evaluate the valves, as well as measure the blood flow and oxygen levels in different parts of your heart. For further information please visit [email or web address]. Please follow instruction sheet, as given.   Your provider has recommended a cardiac catherization   Coronary Angiogram A coronary angiogram, also called coronary angiography, is an X-ray procedure used to look at the arteries in the heart. In this procedure, a dye (contrast dye) is injected through a long, hollow tube (catheter). The catheter is about the size of a piece of cooked spaghetti and is inserted through your groin, wrist, or arm. The dye is injected into each artery, and X-rays are then taken to show if there is a blockage in the arteries of your heart.  LET [facility] CARE PROVIDER KNOW ABOUT:  Any allergies you have, including allergies to shellfish or contrast dye.   All medicines you are taking, including vitamins, herbs, eye drops, creams, and over-the-counter medicines.   Previous problems you or members of your family have had with the use of anesthetics.   Any blood disorders you have.   Previous surgeries you have had.  History of kidney problems  or failure.   Other medical conditions you have.  RISKS AND COMPLICATIONS  Generally, a coronary angiogram is a safe procedure. However, about 1 person out of 1000 can have problems that may include:  Allergic reaction to the dye.  Bleeding/bruising from the access site or other locations.  Kidney injury, especially in people with impaired kidney function.  Stroke (rare).  Heart attack (rare).  Irregular rhythms (rare)  Death (rare)  BEFORE THE PROCEDURE   Do not eat or drink anything after midnight the night before the procedure or as directed by your health care provider.   Ask your health care provider about changing or stopping your regular medicines. This is especially important if you are taking diabetes medicines or blood thinners.  PROCEDURE  You may be given a medicine to help you relax (sedative) before the procedure. This medicine is given through an intravenous (IV) access tube that is inserted into one of your veins.   The area where the catheter will be inserted will be washed and shaved. This is usually done in the groin but may be done in the fold of your arm (near your elbow) or in the wrist.   A medicine will be given to numb the area where the catheter will be inserted (local anesthetic).   The health care provider will insert the catheter into an artery. The catheter will be guided by using a special type of X-ray (fluoroscopy) of the blood vessel being examined.   A special dye will then be injected into the catheter, and X-rays will be taken. The dye will help to show where any narrowing or blockages are located  in the heart arteries.    AFTER THE PROCEDURE   If the procedure is done through the leg, you will be kept in bed lying flat for several hours. You will be instructed to not bend or cross your legs.  The insertion site will be checked frequently.   The pulse in your feet or wrist will be checked frequently.   Additional blood  tests, X-rays, and an electrocardiogram may be done.        Follow-Up: Follow up with Dr. Radford Pax in 5 months.   Any Other Special Instructions Will Be Listed Below (If Applicable).     If you need a refill on your cardiac medications before your next appointment, please call your pharmacy.

## 2016-04-16 NOTE — Progress Notes (Signed)
04/16/2016 Patricia [name]   [birth year]  [id number]  Primary Physician Annye Asa, MD Primary Cardiologist: Dr. Radford Pax    Reason for Visit/CC: Abnormal 2D Echo, discuss indication for RHC  HPI:  The patient is a 76 y/o female, followed by Dr. Radford Pax, with a prior h/o lung CA s/p resection (2003), breast CA s/p lumpectomy/Tamoxifen/XRT, GERD, hypothyroidism, HTN and HLD. 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 She underwent DCCV to NSR on 06/24/2015. She was on amiodarone for a period of time but this was discontinued due to prolonged QT. She is on chronic anticoagulation therapy with Eliquis. She has also been followed by Dr. Chase Caller for SOB.  She was recently seen by Dr. Radford Pax on 03/26/16 for routine f/u.  Pt was noted to be stable w/o any symptoms or issues.  Dr. Radford Pax elected to check a repeat 2D echo for surveillance of her aortic valve. Echo showed normal LVF with increased stiffness of heart muscle, mild AR, mild MR, severe LAE, moderate TR and moderate pulmonary HTN. Compared to prior echo, her pulmonary HTN was noted to be new and considered moderate. PA pressure 52 mmHg. It was also noted that Dr. Chase Caller was concerned she could have a pulmonary vascular problem as chest CT was negative for interstitial lung disease. Given her new findings on echo and history of dyspnea, it was recommended that she undergo a RHC for further assessment.   She presents to clinic today with her husband to review indication for procedure and to outline potential risk. She is stable at rest. She denies resting dyspnea however notes exertional dyspnea with moderate activity. No chest pain or breakthrough symptoms of afib. No orthopnea, PND or LEE. No syncope/ near syncope.     Current Meds  Medication Sig  . ADVAIR HFA 230-21 MCG/ACT inhaler USE 2 INHALATIONS TWICE A DAY  . albuterol (PROVENTIL HFA;VENTOLIN  HFA) 108 (90 Base) MCG/ACT inhaler Inhale 2 puffs into the lungs every 6 (six) hours as needed for wheezing or shortness of breath.  . anastrozole (ARIMIDEX) 1 MG tablet Take 1 tablet (1 mg total) by mouth daily.  Marland Kitchen apixaban (ELIQUIS) 5 MG TABS tablet Take 1 tablet (5 mg total) by mouth 2 (two) times daily.  Marland Kitchen atorvastatin (LIPITOR) 40 MG tablet Take 1 tablet (40 mg total) by mouth daily.  . bimatoprost (LUMIGAN) 0.03 % ophthalmic drops Place 1 drop into both eyes at bedtime.   Marland Kitchen diltiazem (CARDIZEM) 30 MG tablet Take 1 tablet every 4 hours AS NEEDED for fasting heart rate >100 as long as blood pressure >100.  . dorzolamide (TRUSOPT) 2 % ophthalmic solution Place 1 drop into both eyes 2 (two) times daily.  . furosemide (LASIX) 20 MG tablet Take 1 tablet (20 mg total) by mouth as directed. Take 2 tabs in the morning and 1 tab in the pm  . gabapentin (NEURONTIN) 400 MG capsule Take 1 capsule (400 mg total) by mouth 2 (two) times daily.  Marland Kitchen loratadine (CLARITIN) 10 MG tablet Take 10 mg by mouth daily as needed for allergies. Reported on 09/16/2015  . Multiple Vitamins-Iron (MULTIVITAMIN/IRON) TABS Take 1 tablet by mouth daily.   Marland Kitchen omeprazole (PRILOSEC) 20 MG capsule TAKE 1 CAPSULE DAILY 30 MINUTES BEFORE A MEAL  . potassium chloride SA (K-DUR,KLOR-CON) 20 MEQ tablet Take 1 tablet (20 mEq total) by mouth daily.  Marland Kitchen thyroid (ARMOUR THYROID) 60 MG tablet Take  1 tablet (60 mg total) by mouth daily before breakfast.  . traMADol (ULTRAM) 50 MG tablet Take 1 tablet (50 mg total) by mouth every 6 (six) hours as needed for moderate pain.   Allergies  Allergen Reactions  . Combigan [Brimonidine Tartrate-Timolol] Itching    Eyes itch, reddened  . Other Other (See Comments)    Per patient made OU red, Sore, and sensitivity to light  . Sulfamethoxazole-Trimethoprim Hives  . Sulfa Antibiotics Rash   Past Medical History:  Diagnosis Date  . Allergic rhinitis, cause unspecified   . Aortic regurgitation  06/17/2015   Mild to moderate AR by echo 04/2015  . 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 Chronic cough   . Chronic diastolic heart failure ([place]) 06/17/2015  . CKD (chronic kidney disease), stage III   . Cough variant asthma   . Disease of pharynx or nasopharynx   . Diverticulosis   . Essential hypertension   . GERD (gastroesophageal reflux disease)   . Glaucoma   . Hiatal hernia   . Hyperlipidemia   . Internal hemorrhoids   . Laryngospasm   . Multinodular goiter   . Osteopenia   . Persistent atrial fibrillation ([place])    a. Dx 04/2015 at time of stroke. b. DCCV 06/2015 - did not hold. Amio started then stopped due to QT prolongation.  Marland Kitchen Postmenopausal   . Radiation 10/22/14-11/23/14   Left Breast  . Stricture and stenosis of cervix    Family History  Problem Relation Age of Onset  . Stroke Mother   . Hypertension Mother   . Hyperlipidemia Mother   . Stroke Father   . Transient ischemic attack Father   . Hyperlipidemia Father   . Hypertension Father   . Atrial fibrillation Son   . Heart attack Neg Hx    Past Surgical History:  Procedure Laterality Date  . BREAST SURGERY    . CARDIOVERSION N/A 06/24/2015   Procedure: CARDIOVERSION;  Surgeon: Sueanne Margarita, MD;  Location: [facility];  Service: Cardiovascular;  Laterality: N/A;  . COLONOSCOPY    . LUNG CANCER SURGERY  2003   resection carcinoid lingula-lt upper lobe  . RADIOACTIVE SEED GUIDED MASTECTOMY WITH AXILLARY SENTINEL LYMPH NODE BIOPSY Left 06/26/2014   Procedure: RADIOACTIVE SEED GUIDED PARTIAL MASTECTOMY WITH AXILLARY SENTINEL LYMPH NODE BIOPSY;  Surgeon: Stark Klein, MD;  Location: [place];  Service: General;  Laterality: Left;  . RE-EXCISION OF BREAST LUMPECTOMY Left 08/07/2014   Procedure: RE-EXCISION OF LEFT BREAST LUMPECTOMY;  Surgeon: Stark Klein, MD;  Location: [place];  Service: General;  Laterality: Left;   Social  History   Social History  . Marital status: Married    Spouse name: N/A  . Number of children: N/A  . Years of education: N/A   Occupational History  . Not on file.   Social History Main Topics  . Smoking status: Former Smoker    Packs/day: 0.75    Years: 5.00    Quit date: 06/14/1961  . Smokeless tobacco: Never Used  . Alcohol use 0.0 oz/week     Comment: occasional wine  . Drug use: No  . Sexual activity: Not on file   Other Topics Concern  . Not on file   Social History Narrative   Retired. Lives with husband.      Review of Systems: General: negative for chills, fever, night sweats  or weight changes.  Cardiovascular: negative for chest pain, dyspnea on exertion, edema, orthopnea, palpitations, paroxysmal nocturnal dyspnea or shortness of breath Dermatological: negative for rash Respiratory: negative for cough or wheezing Urologic: negative for hematuria Abdominal: negative for nausea, vomiting, diarrhea, bright red blood per rectum, melena, or hematemesis Neurologic: negative for visual changes, syncope, or dizziness All other systems reviewed and are otherwise negative except as noted above.   Physical Exam:  Blood pressure 140/62, pulse 84, height '4\' 11"'$  (1.499 m), weight 138 lb 12.8 oz (63 kg).  General appearance: alert, cooperative and no distress Neck: no carotid bruit and no JVD Lungs: clear to auscultation bilaterally Heart: regular rate and rhythm, S1, S2 normal, no murmur, click, rub or gallop Extremities: extremities normal, atraumatic, no cyanosis or edema Pulses: 2+ and symmetric Skin: Skin color, texture, turgor normal. No rashes or lesions Neurologic: Grossly normal  EKG not performed   2D Echo 04/09/16 Study Conclusions  - Left ventricle: The cavity size was normal. Wall thickness was   normal. Systolic function was normal. The estimated ejection   fraction was in the range of 55% to 60%. Wall motion was normal;   there were no regional  wall motion abnormalities. Features are   consistent with a pseudonormal left ventricular filling pattern,   with concomitant abnormal relaxation and increased filling   pressure (grade 2 diastolic dysfunction). Doppler parameters are   consistent with high ventricular filling pressure. - Aortic valve: There was mild regurgitation. - Mitral valve: Calcified annulus. There was mild regurgitation. - Left atrium: The atrium was severely dilated. - Tricuspid valve: There was moderate regurgitation. - Pulmonary arteries: Systolic pressure was moderately increased.   PA peak pressure: 52 mm Hg (S).  Impressions:  - Normal LV systolic function; grade 2 diastolic dysfunction;   elevated LV filling pressure; mild AI; mild MR; severe LAE;   moderate TR with moderately elevated pulmonary pressure.   ASSESSMENT AND PLAN:   1. Abnormal 2D Echo Suggestive of Moderate PAH: PA pressured measured at 52 mgHg with moderate TR by echocardiogram. Pt has h/o exertional dyspnea. Non ischemic stress test 04/2015. She has been followed by Dr. Chase Caller, who was concerned she could have a pulmonary vascular problem as chest CT was negative for interstitial lung disease. Given her new findings on echo and history of dyspnea, it was recommended that she undergo a RHC for further assessment. We discussed indication for procedure and well as discussed potential risk. Pt agrees to proceed. We will schedule her for outpatient RHC with Dr. Aundra Dubin on 04/27/16. I've discussed case with pharmacy, who has recommended that she hold Eliquis for 24 hrs prior to procedure.   2. Chronic Diastolic HF: euvolemic on physical exam. Continue Lasix. Pt instructed to hold morning of cath.   3. Aortic Insuffiencey: mild by recent echo. Continue routine survalience per Dr. Radford Pax.   4. Atrial fibrillation:  maintaining NSR off amio.  Continue CCB PRN for breakthrough.  Continue Eliquis but hold 24 hrs prior to cath.    PLAN  Plan for  RHC with Dr. Aundra Dubin 04/27/16.   Brittainy Simmons PA-C 04/16/2016 10:11 AM

## 2016-04-24 ENCOUNTER — Other Ambulatory Visit: Payer: Medicare Other

## 2016-04-24 DIAGNOSIS — H401133 Primary open-angle glaucoma, bilateral, severe stage: Secondary | ICD-10-CM | POA: Diagnosis not present

## 2016-04-24 DIAGNOSIS — I270 Primary pulmonary hypertension: Secondary | ICD-10-CM

## 2016-04-24 DIAGNOSIS — H04123 Dry eye syndrome of bilateral lacrimal glands: Secondary | ICD-10-CM | POA: Diagnosis not present

## 2016-04-24 DIAGNOSIS — H524 Presbyopia: Secondary | ICD-10-CM | POA: Diagnosis not present

## 2016-04-24 DIAGNOSIS — H2513 Age-related nuclear cataract, bilateral: Secondary | ICD-10-CM | POA: Diagnosis not present

## 2016-04-25 LAB — CBC
Hematocrit: 43 % (ref 34.0–46.6)
Hemoglobin: 14.6 g/dL (ref 11.1–15.9)
MCH: 31.7 pg (ref 26.6–33.0)
MCHC: 34 g/dL (ref 31.5–35.7)
MCV: 94 fL (ref 79–97)
PLATELETS: 288 10*3/uL (ref 150–379)
RBC: 4.6 x10E6/uL (ref 3.77–5.28)
RDW: 13.6 % (ref 12.3–15.4)
WBC: 7 10*3/uL (ref 3.4–10.8)

## 2016-04-25 LAB — BASIC METABOLIC PANEL
BUN/Creatinine Ratio: 21 (ref 12–28)
BUN: 22 mg/dL (ref 8–27)
CALCIUM: 9.5 mg/dL (ref 8.7–10.3)
CHLORIDE: 103 mmol/L (ref 96–106)
CO2: 28 mmol/L (ref 18–29)
Creatinine, Ser: 1.07 mg/dL — ABNORMAL HIGH (ref 0.57–1.00)
GFR calc Af Amer: 59 mL/min/{1.73_m2} — ABNORMAL LOW (ref >59)
GFR calc non Af Amer: 51 mL/min/{1.73_m2} — ABNORMAL LOW (ref >59)
Glucose: 97 mg/dL (ref 65–99)
POTASSIUM: 4 mmol/L (ref 3.5–5.2)
Sodium: 146 mmol/L — ABNORMAL HIGH (ref 134–144)

## 2016-04-25 LAB — PROTIME-INR
INR: 1 (ref 0.8–1.2)
PROTHROMBIN TIME: 10.5 s (ref 9.1–12.0)

## 2016-04-27 ENCOUNTER — Ambulatory Visit (HOSPITAL_COMMUNITY)
Admission: RE | Admit: 2016-04-27 | Discharge: 2016-04-27 | Disposition: A | Discharge status: Home / Self Care | Payer: Medicare Other | Source: Ambulatory Visit | Attending: Cardiology | Admitting: Cardiology

## 2016-04-27 ENCOUNTER — Encounter (HOSPITAL_COMMUNITY)
Admission: RE | Disposition: A | Discharge status: Home / Self Care | Payer: Self-pay | Source: Ambulatory Visit | Attending: Cardiology

## 2016-04-27 DIAGNOSIS — I130 Hypertensive heart and chronic kidney disease with heart failure and stage 1 through stage 4 chronic kidney disease, or unspecified chronic kidney disease: Secondary | ICD-10-CM | POA: Diagnosis not present

## 2016-04-27 DIAGNOSIS — M8580 Other specified disorders of bone density and structure, unspecified site: Secondary | ICD-10-CM | POA: Insufficient documentation

## 2016-04-27 DIAGNOSIS — Z8673 Personal history of transient ischemic attack (TIA), and cerebral infarction without residual deficits: Secondary | ICD-10-CM | POA: Insufficient documentation

## 2016-04-27 DIAGNOSIS — N183 Chronic kidney disease, stage 3 (moderate): Secondary | ICD-10-CM | POA: Insufficient documentation

## 2016-04-27 DIAGNOSIS — I272 Pulmonary hypertension, unspecified: Secondary | ICD-10-CM | POA: Diagnosis present

## 2016-04-27 DIAGNOSIS — Z8249 Family history of ischemic heart disease and other diseases of the circulatory system: Secondary | ICD-10-CM | POA: Diagnosis not present

## 2016-04-27 DIAGNOSIS — I340 Nonrheumatic mitral (valve) insufficiency: Secondary | ICD-10-CM | POA: Diagnosis not present

## 2016-04-27 DIAGNOSIS — I480 Paroxysmal atrial fibrillation: Secondary | ICD-10-CM | POA: Diagnosis not present

## 2016-04-27 DIAGNOSIS — I5032 Chronic diastolic (congestive) heart failure: Secondary | ICD-10-CM | POA: Insufficient documentation

## 2016-04-27 DIAGNOSIS — Z7951 Long term (current) use of inhaled steroids: Secondary | ICD-10-CM | POA: Insufficient documentation

## 2016-04-27 DIAGNOSIS — Z823 Family history of stroke: Secondary | ICD-10-CM | POA: Diagnosis not present

## 2016-04-27 DIAGNOSIS — Z882 Allergy status to sulfonamides status: Secondary | ICD-10-CM | POA: Insufficient documentation

## 2016-04-27 DIAGNOSIS — K449 Diaphragmatic hernia without obstruction or gangrene: Secondary | ICD-10-CM | POA: Diagnosis not present

## 2016-04-27 DIAGNOSIS — Z87891 Personal history of nicotine dependence: Secondary | ICD-10-CM | POA: Diagnosis not present

## 2016-04-27 DIAGNOSIS — K219 Gastro-esophageal reflux disease without esophagitis: Secondary | ICD-10-CM | POA: Diagnosis not present

## 2016-04-27 DIAGNOSIS — H409 Unspecified glaucoma: Secondary | ICD-10-CM | POA: Insufficient documentation

## 2016-04-27 DIAGNOSIS — Z7901 Long term (current) use of anticoagulants: Secondary | ICD-10-CM | POA: Insufficient documentation

## 2016-04-27 DIAGNOSIS — E785 Hyperlipidemia, unspecified: Secondary | ICD-10-CM | POA: Insufficient documentation

## 2016-04-27 DIAGNOSIS — E039 Hypothyroidism, unspecified: Secondary | ICD-10-CM | POA: Insufficient documentation

## 2016-04-27 DIAGNOSIS — M19049 Primary osteoarthritis, unspecified hand: Secondary | ICD-10-CM | POA: Insufficient documentation

## 2016-04-27 HISTORY — PX: RIGHT HEART CATH: CATH118263

## 2016-04-27 LAB — POCT I-STAT 3, VENOUS BLOOD GAS (G3P V)
BICARBONATE: 25.2 mmol/L (ref 20.0–28.0)
O2 SAT: 61 %
PCO2 VEN: 42.5 mmHg — AB (ref 44.0–60.0)
TCO2: 26 mmol/L (ref 0–100)
pH, Ven: 7.381 (ref 7.250–7.430)
pO2, Ven: 32 mmHg (ref 32.0–45.0)

## 2016-04-27 SURGERY — RIGHT HEART CATH
Anesthesia: LOCAL

## 2016-04-27 MED ORDER — SODIUM CHLORIDE 0.9% FLUSH: 3.0000 mL | Freq: Two times a day (BID) | INTRAVENOUS | Status: DC

## 2016-04-27 MED ORDER — SODIUM CHLORIDE 0.9% FLUSH: 3.0000 mL | INTRAVENOUS | Status: DC | PRN

## 2016-04-27 MED ORDER — HEPARIN (PORCINE) IN NACL 2-0.9 UNIT/ML-% IJ SOLN
INTRAMUSCULAR | Status: AC
  Filled 2016-04-27: qty 500

## 2016-04-27 MED ORDER — SODIUM CHLORIDE 0.9 % IV SOLN: 250.0000 mL | INTRAVENOUS | Status: DC | PRN

## 2016-04-27 MED ORDER — SODIUM CHLORIDE 0.9 % WEIGHT BASED INFUSION: 1.0000 mL/kg/h | INTRAVENOUS | Status: DC

## 2016-04-27 MED ORDER — LIDOCAINE HCL (PF) 1 % IJ SOLN
INTRAMUSCULAR | Status: DC | PRN
  Administered 2016-04-27: 2 mL via INTRADERMAL

## 2016-04-27 MED ORDER — DILTIAZEM HCL ER COATED BEADS 180 MG PO CP24
180.0000 mg | ORAL_CAPSULE | Freq: Every day | ORAL | Status: DC
  Administered 2016-04-27: 180 mg via ORAL
  Filled 2016-04-27 (×2): qty 1

## 2016-04-27 MED ORDER — LIDOCAINE HCL (PF) 1 % IJ SOLN
INTRAMUSCULAR | Status: AC
  Filled 2016-04-27: qty 30

## 2016-04-27 MED ORDER — HEPARIN (PORCINE) IN NACL 2-0.9 UNIT/ML-% IJ SOLN
INTRAMUSCULAR | Status: DC | PRN
  Administered 2016-04-27: 500 mL

## 2016-04-27 MED ORDER — SODIUM CHLORIDE 0.9 % WEIGHT BASED INFUSION
3.0000 mL/kg/h | INTRAVENOUS | Status: AC
  Administered 2016-04-27: 3 mL/kg/h via INTRAVENOUS

## 2016-04-27 MED ORDER — ONDANSETRON HCL 4 MG/2ML IJ SOLN: 4.0000 mg | Freq: Four times a day (QID) | INTRAMUSCULAR | Status: DC | PRN

## 2016-04-27 MED ORDER — APIXABAN 5 MG PO TABS: 5.0000 mg | ORAL_TABLET | Freq: Two times a day (BID) | ORAL | Status: DC

## 2016-04-27 MED ORDER — ACETAMINOPHEN 325 MG PO TABS: 650.0000 mg | ORAL_TABLET | ORAL | Status: DC | PRN

## 2016-04-27 SURGICAL SUPPLY — 6 items
CATH BALLN WEDGE 5F 110CM (CATHETERS) ×2 IMPLANT
KIT HEART LEFT (KITS) ×2 IMPLANT
PACK CARDIAC CATHETERIZATION (CUSTOM PROCEDURE TRAY) ×2 IMPLANT
SYR MEDRAD MARK V 150ML (SYRINGE) ×2 IMPLANT
TRANSDUCER W/STOPCOCK (MISCELLANEOUS) ×2 IMPLANT
TUBING CIL FLEX 10 FLL-RA (TUBING) ×2 IMPLANT

## 2016-04-27 NOTE — H&P (View-Only) (Signed)
04/16/2016 Patricia [name]   05-Dec-1940  [id number]  Primary Physician Patricia Asa, MD Primary Cardiologist: Dr. Radford [name]    Reason for Visit/CC: Abnormal 2D Echo, discuss indication for RHC  HPI:  The patient is a 76 y/o female, followed by Dr. Radford [name], with a prior h/o lung CA s/p resection (2003), breast CA s/p lumpectomy/Tamoxifen/XRT, GERD, hypothyroidism, HTN and HLD. 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 She underwent DCCV to NSR on 06/24/2015. She was on amiodarone for a period of time but this was discontinued due to prolonged QT. She is on chronic anticoagulation therapy with Eliquis. She has also been followed by Dr. Chase [name] for SOB.  She was recently seen by Dr. Radford [name] on 03/26/16 for routine f/u.  Pt was noted to be stable w/o any symptoms or issues.  Dr. Radford [name] elected to check a repeat 2D echo for surveillance of her aortic valve. Echo showed normal LVF with increased stiffness of heart muscle, mild AR, mild MR, severe LAE, moderate TR and moderate pulmonary HTN. Compared to prior echo, her pulmonary HTN was noted to be new and considered moderate. PA pressure 52 mmHg. It was also noted that Dr. Chase [name] was concerned she could have a pulmonary vascular problem as chest CT was negative for interstitial lung disease. Given her new findings on echo and history of dyspnea, it was recommended that she undergo a RHC for further assessment.   She presents to clinic today with her husband to review indication for procedure and to outline potential risk. She is stable at rest. She denies resting dyspnea however notes exertional dyspnea with moderate activity. No chest pain or breakthrough symptoms of afib. No orthopnea, PND or LEE. No syncope/ near syncope.     Current Meds  Medication Sig  . ADVAIR HFA 230-21 MCG/ACT inhaler USE 2 INHALATIONS TWICE A DAY  . albuterol (PROVENTIL HFA;VENTOLIN  HFA) 108 (90 Base) MCG/ACT inhaler Inhale 2 puffs into the lungs every 6 (six) hours as needed for wheezing or shortness of breath.  . anastrozole (ARIMIDEX) 1 MG tablet Take 1 tablet (1 mg total) by mouth daily.  Marland Kitchen apixaban (ELIQUIS) 5 MG TABS tablet Take 1 tablet (5 mg total) by mouth 2 (two) times daily.  Marland Kitchen atorvastatin (LIPITOR) 40 MG tablet Take 1 tablet (40 mg total) by mouth daily.  . bimatoprost (LUMIGAN) 0.03 % ophthalmic drops Place 1 drop into both eyes at bedtime.   Marland Kitchen diltiazem (CARDIZEM) 30 MG tablet Take 1 tablet every 4 hours AS NEEDED for fasting heart rate >100 as long as blood pressure >100.  . dorzolamide (TRUSOPT) 2 % ophthalmic solution Place 1 drop into both eyes 2 (two) times daily.  . furosemide (LASIX) 20 MG tablet Take 1 tablet (20 mg total) by mouth as directed. Take 2 tabs in the morning and 1 tab in the pm  . gabapentin (NEURONTIN) 400 MG capsule Take 1 capsule (400 mg total) by mouth 2 (two) times daily.  Marland Kitchen loratadine (CLARITIN) 10 MG tablet Take 10 mg by mouth daily as needed for allergies. Reported on 09/16/2015  . Multiple Vitamins-Iron (MULTIVITAMIN/IRON) TABS Take 1 tablet by mouth daily.   Marland Kitchen omeprazole (PRILOSEC) 20 MG capsule TAKE 1 CAPSULE DAILY 30 MINUTES BEFORE A MEAL  . potassium chloride SA (K-DUR,KLOR-CON) 20 MEQ tablet Take 1 tablet (20 mEq total) by mouth daily.  Marland Kitchen thyroid (ARMOUR THYROID) 60 MG tablet Take  1 tablet (60 mg total) by mouth daily before breakfast.  . traMADol (ULTRAM) 50 MG tablet Take 1 tablet (50 mg total) by mouth every 6 (six) hours as needed for moderate pain.   Allergies  Allergen Reactions  . Combigan [Brimonidine Tartrate-Timolol] Itching    Eyes itch, reddened  . Other Other (See Comments)    Per patient made OU red, Sore, and sensitivity to light  . Sulfamethoxazole-Trimethoprim Hives  . Sulfa Antibiotics Rash   Past Medical History:  Diagnosis Date  . Allergic rhinitis, cause unspecified   . Aortic regurgitation  06/17/2015   Mild to moderate AR by echo 04/2015  . 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 Chronic cough   . Chronic diastolic heart failure ([place]) 06/17/2015  . CKD (chronic kidney disease), stage III   . Cough variant asthma   . Disease of pharynx or nasopharynx   . Diverticulosis   . Essential hypertension   . GERD (gastroesophageal reflux disease)   . Glaucoma   . Hiatal hernia   . Hyperlipidemia   . Internal hemorrhoids   . Laryngospasm   . Multinodular goiter   . Osteopenia   . Persistent atrial fibrillation ([place])    a. Dx 04/2015 at time of stroke. b. DCCV 06/2015 - did not hold. Amio started then stopped due to QT prolongation.  Marland Kitchen Postmenopausal   . Radiation 10/22/14-11/23/14   Left Breast  . Stricture and stenosis of cervix    Family History  Problem Relation Age of Onset  . Stroke Mother   . Hypertension Mother   . Hyperlipidemia Mother   . Stroke Father   . Transient ischemic attack Father   . Hyperlipidemia Father   . Hypertension Father   . Atrial fibrillation Son   . Heart attack Neg Hx    Past Surgical History:  Procedure Laterality Date  . BREAST SURGERY    . CARDIOVERSION N/A 06/24/2015   Procedure: CARDIOVERSION;  Surgeon: Patricia Margarita, MD;  Location[facility];  Service: Cardiovascular;  Laterality: N/A;  . COLONOSCOPY    . LUNG CANCER SURGERY  2003   resection carcinoid lingula-lt upper lobe  . RADIOACTIVE SEED GUIDED MASTECTOMY WITH AXILLARY SENTINEL LYMPH NODE BIOPSY Left 06/26/2014   Procedure: RADIOACTIVE SEED GUIDED PARTIAL MASTECTOMY WITH AXILLARY SENTINEL LYMPH NODE BIOPSY;  Surgeon: Patricia Klein, MD;  Location: [place];  Service: General;  Laterality: Left;  . RE-EXCISION OF BREAST LUMPECTOMY Left 08/07/2014   Procedure: RE-EXCISION OF LEFT BREAST LUMPECTOMY;  Surgeon: Patricia Klein, MD;  Location: [place];  Service: General;  Laterality: Left;   Social  History   Social History  . Marital status: Married    Spouse name: N/A  . Number of children: N/A  . Years of education: N/A   Occupational History  . Not on file.   Social History Main Topics  . Smoking status: Former Smoker    Packs/day: 0.75    Years: 5.00    Quit date: 06/14/1961  . Smokeless tobacco: Never Used  . Alcohol use 0.0 oz/week     Comment: occasional wine  . Drug use: No  . Sexual activity: Not on file   Other Topics Concern  . Not on file   Social History Narrative   Retired. Lives with husband.      Review of Systems: General: negative for chills, fever, night sweats  or weight changes.  Cardiovascular: negative for chest pain, dyspnea on exertion, edema, orthopnea, palpitations, paroxysmal nocturnal dyspnea or shortness of breath Dermatological: negative for rash Respiratory: negative for cough or wheezing Urologic: negative for hematuria Abdominal: negative for nausea, vomiting, diarrhea, bright red blood per rectum, melena, or hematemesis Neurologic: negative for visual changes, syncope, or dizziness All other systems reviewed and are otherwise negative except as noted above.   Physical Exam:  Blood pressure 140/62, pulse 84, height '4\' 11"'$  (1.499 m), weight 138 lb 12.8 oz (63 kg).  General appearance: alert, cooperative and no distress Neck: no carotid bruit and no JVD Lungs: clear to auscultation bilaterally Heart: regular rate and rhythm, S1, S2 normal, no murmur, click, rub or gallop Extremities: extremities normal, atraumatic, no cyanosis or edema Pulses: 2+ and symmetric Skin: Skin color, texture, turgor normal. No rashes or lesions Neurologic: Grossly normal  EKG not performed   2D Echo 04/09/16 Study Conclusions  - Left ventricle: The cavity size was normal. Wall thickness was   normal. Systolic function was normal. The estimated ejection   fraction was in the range of 55% to 60%. Wall motion was normal;   there were no regional  wall motion abnormalities. Features are   consistent with a pseudonormal left ventricular filling pattern,   with concomitant abnormal relaxation and increased filling   pressure (grade 2 diastolic dysfunction). Doppler parameters are   consistent with high ventricular filling pressure. - Aortic valve: There was mild regurgitation. - Mitral valve: Calcified annulus. There was mild regurgitation. - Left atrium: The atrium was severely dilated. - Tricuspid valve: There was moderate regurgitation. - Pulmonary arteries: Systolic pressure was moderately increased.   PA peak pressure: 52 mm Hg (S).  Impressions:  - Normal LV systolic function; grade 2 diastolic dysfunction;   elevated LV filling pressure; mild AI; mild MR; severe LAE;   moderate TR with moderately elevated pulmonary pressure.   ASSESSMENT AND PLAN:   1. Abnormal 2D Echo Suggestive of Moderate PAH: PA pressured measured at 52 mgHg with moderate TR by echocardiogram. Pt has h/o exertional dyspnea. Non ischemic stress test 04/2015. She has been followed by Dr. Chase [name], who was concerned she could have a pulmonary vascular problem as chest CT was negative for interstitial lung disease. Given her new findings on echo and history of dyspnea, it was recommended that she undergo a RHC for further assessment. We discussed indication for procedure and well as discussed potential risk. Pt agrees to proceed. We will schedule her for outpatient RHC with Dr. Aundra Dubin on 04/27/16. I've discussed case with pharmacy, who has recommended that she hold Eliquis for 24 hrs prior to procedure.   2. Chronic Diastolic HF: euvolemic on physical exam. Continue Lasix. Pt instructed to hold morning of cath.   3. Aortic Insuffiencey: mild by recent echo. Continue routine survalience per Dr. Radford [name].   4. Atrial fibrillation:  maintaining NSR off amio.  Continue CCB PRN for breakthrough.  Continue Eliquis but hold 24 hrs prior to cath.    PLAN  Plan for  RHC with Dr. Aundra Dubin 04/27/16.   Scheryl Sanborn PA-C 04/16/2016 10:11 AM

## 2016-04-27 NOTE — Interval H&P Note (Signed)
History and Physical Interval Note:  04/27/2016 8:12 AM  Patricia [name]  has presented today for surgery, with the diagnosis of pul htn  The various methods of treatment have been discussed with the patient and family. After consideration of risks, benefits and other options for treatment, the patient has consented to  Procedure(s): Right Heart Cath (N/A) as a surgical intervention .  The patient's history has been reviewed, patient examined, no change in status, stable for surgery.  I have reviewed the patient's chart and labs.  Questions were answered to the patient's satisfaction.     Shawntelle Ungar [employer]

## 2016-04-27 NOTE — Discharge Instructions (Signed)
Please monitor right arm site.  Call for any signs or symptoms of infection. Next dose of Eliquis is 5:00pm today, 04/27/16  You have an appointment set up with the [facility].  Multiple studies have shown that being followed by a dedicated atrial fibrillation clinic in addition to the standard care you receive from your other physicians improves health. We believe that enrollment in the atrial fibrillation clinic will allow [name] to better care for you.   The phone number to the [facility] is [phone]. The clinic is staffed Monday through Friday from 8:30am to 5pm.  Parking Directions: The clinic is located in the Heart and Vascular Building connected to [facility]. 1)From [address] turn on to [address] and go to the 3rd entrance  (Heart and Vascular entrance) on the right. 2)Look to the right for Heart &Vascular Parking Garage. 3)A code for the entrance is required please call the clinic to receive this.   4)Take the elevators to the 1st floor. Registration is in the room with the glass walls at the end of the hallway.  If you have any trouble parking or locating the clinic, please dont hesitate to call [phone].

## 2016-04-28 ENCOUNTER — Encounter (HOSPITAL_COMMUNITY): Payer: Self-pay | Admitting: Cardiology

## 2016-04-29 ENCOUNTER — Ambulatory Visit (HOSPITAL_COMMUNITY)
Admit: 2016-04-29 | Discharge: 2016-04-29 | Disposition: A | Discharge status: Home / Self Care | Payer: Medicare Other | Attending: Nurse Practitioner | Admitting: Nurse Practitioner

## 2016-04-29 ENCOUNTER — Telehealth: Payer: Self-pay | Admitting: Cardiology

## 2016-04-29 ENCOUNTER — Encounter (HOSPITAL_COMMUNITY): Payer: Self-pay | Admitting: Nurse Practitioner

## 2016-04-29 VITALS — BP 118/82 | HR 110 | Ht 59.0 in | Wt 140.0 lb

## 2016-04-29 DIAGNOSIS — H409 Unspecified glaucoma: Secondary | ICD-10-CM | POA: Diagnosis not present

## 2016-04-29 DIAGNOSIS — K449 Diaphragmatic hernia without obstruction or gangrene: Secondary | ICD-10-CM | POA: Diagnosis not present

## 2016-04-29 DIAGNOSIS — I130 Hypertensive heart and chronic kidney disease with heart failure and stage 1 through stage 4 chronic kidney disease, or unspecified chronic kidney disease: Secondary | ICD-10-CM | POA: Diagnosis not present

## 2016-04-29 DIAGNOSIS — I480 Paroxysmal atrial fibrillation: Secondary | ICD-10-CM | POA: Diagnosis not present

## 2016-04-29 DIAGNOSIS — Z8673 Personal history of transient ischemic attack (TIA), and cerebral infarction without residual deficits: Secondary | ICD-10-CM | POA: Insufficient documentation

## 2016-04-29 DIAGNOSIS — E785 Hyperlipidemia, unspecified: Secondary | ICD-10-CM | POA: Insufficient documentation

## 2016-04-29 DIAGNOSIS — I5032 Chronic diastolic (congestive) heart failure: Secondary | ICD-10-CM | POA: Diagnosis not present

## 2016-04-29 DIAGNOSIS — K648 Other hemorrhoids: Secondary | ICD-10-CM | POA: Diagnosis not present

## 2016-04-29 DIAGNOSIS — I272 Pulmonary hypertension, unspecified: Secondary | ICD-10-CM | POA: Diagnosis not present

## 2016-04-29 DIAGNOSIS — M8580 Other specified disorders of bone density and structure, unspecified site: Secondary | ICD-10-CM | POA: Diagnosis not present

## 2016-04-29 DIAGNOSIS — I481 Persistent atrial fibrillation: Secondary | ICD-10-CM | POA: Diagnosis not present

## 2016-04-29 DIAGNOSIS — Z85118 Personal history of other malignant neoplasm of bronchus and lung: Secondary | ICD-10-CM | POA: Insufficient documentation

## 2016-04-29 DIAGNOSIS — K219 Gastro-esophageal reflux disease without esophagitis: Secondary | ICD-10-CM | POA: Diagnosis not present

## 2016-04-29 DIAGNOSIS — Z87891 Personal history of nicotine dependence: Secondary | ICD-10-CM | POA: Insufficient documentation

## 2016-04-29 DIAGNOSIS — N183 Chronic kidney disease, stage 3 (moderate): Secondary | ICD-10-CM | POA: Diagnosis not present

## 2016-04-29 LAB — POCT I-STAT 3, VENOUS BLOOD GAS (G3P V)
BICARBONATE: 25.1 mmol/L (ref 20.0–28.0)
O2 SAT: 62 %
PO2 VEN: 33 mmHg (ref 32.0–45.0)
TCO2: 26 mmol/L (ref 0–100)
pCO2, Ven: 42.6 mmHg — ABNORMAL LOW (ref 44.0–60.0)
pH, Ven: 7.378 (ref 7.250–7.430)

## 2016-04-29 NOTE — Telephone Encounter (Signed)
Returned pts call and we went over her pre-cath labs and pt was very appreciative of the call.

## 2016-04-29 NOTE — Progress Notes (Signed)
Primary Care Physician: Annye Asa, MD Cardiologist: Dr. Radford Pax Referring MD: Dr. Gordy Clement Patricia [name] is a 76 y.o. female with a h/o persisitent afib that with last f/u in the afib clinic 02/16/16 for f/u in the afib clinic. Since stopping amiodarone for prolonged QT, she has not had any further afib. She felt well.  She had a rt heart cath with Dr. Aundra Dubin on Monday, after echo showed pulmonary hypertension, and found to be in afib with RVR. She was asymptomatic. He started diltazem 180 mg which she has now taken twice. She missed one day of eliquis and restarted pm of cath. Dr. Aundra Dubin mentioned that [place] may be needed.  In the afib clinic,she is in afib at 110 bpm, again she cannot tell she is in afib. She feels well.  Today, she denies symptoms of palpitations, chest pain, shortness of breath, orthopnea, PND, lower extremity edema, dizziness, presyncope, syncope, or neurologic sequela. The patient is tolerating medications without difficulties and is otherwise without complaint today.   Past Medical History:  Diagnosis Date  . Allergic rhinitis, cause unspecified   . Aortic regurgitation 06/17/2015   Mild to moderate AR by echo 04/2015  . 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 Chronic cough   . Chronic diastolic heart failure ([place]) 06/17/2015  . CKD (chronic kidney disease), stage III   . Cough variant asthma   . Disease of pharynx or nasopharynx   . Diverticulosis   . Essential hypertension   . GERD (gastroesophageal reflux disease)   . Glaucoma   . Hiatal hernia   . Hyperlipidemia   . Internal hemorrhoids   . Laryngospasm   . Multinodular goiter   . Osteopenia   . Persistent atrial fibrillation ([place])    a. Dx 04/2015 at time of stroke. b. DCCV 06/2015 - did not hold. Amio started then stopped due to QT prolongation.  Marland Kitchen Postmenopausal   . Radiation 10/22/14-11/23/14   Left Breast  . Stricture and stenosis of  cervix    Past Surgical History:  Procedure Laterality Date  . BREAST SURGERY    . CARDIOVERSION N/A 06/24/2015   Procedure: CARDIOVERSION;  Surgeon: Sueanne Margarita, MD;  [facility]: [facility];  Service: Cardiovascular;  Laterality: N/A;  . COLONOSCOPY    . LUNG CANCER SURGERY  2003   resection carcinoid lingula-lt upper lobe  . RADIOACTIVE SEED GUIDED MASTECTOMY WITH AXILLARY SENTINEL LYMPH NODE BIOPSY Left 06/26/2014   Procedure: RADIOACTIVE SEED GUIDED PARTIAL MASTECTOMY WITH AXILLARY SENTINEL LYMPH NODE BIOPSY;  Surgeon: Stark Klein, MD;  Location: [place];  Service: General;  Laterality: Left;  . RE-EXCISION OF BREAST LUMPECTOMY Left 08/07/2014   Procedure: RE-EXCISION OF LEFT BREAST LUMPECTOMY;  Surgeon: Stark Klein, MD;  Location: [place];  Service: General;  Laterality: Left;  . RIGHT HEART CATH N/A 04/27/2016   Procedure: Right Heart Cath;  Surgeon: Larey Dresser, MD;  Location: [place] CV LAB;  Service: Cardiovascular;  Laterality: N/A;    Current Outpatient Prescriptions  Medication Sig Dispense Refill  . ADVAIR HFA 230-21 MCG/ACT inhaler USE 2 INHALATIONS TWICE A DAY 36 g 1  . albuterol (PROVENTIL HFA;VENTOLIN HFA) 108 (90 Base) MCG/ACT inhaler Inhale 2 puffs into the lungs every 6 (six) hours as needed for wheezing or shortness of breath.    . anastrozole (ARIMIDEX) 1 MG tablet Take 1 tablet (1 mg  total) by mouth daily. 90 tablet 3  . apixaban (ELIQUIS) 5 MG TABS tablet Take 1 tablet (5 mg total) by mouth 2 (two) times daily. (Patient taking differently: Take 5 mg by mouth 2 (two) times daily. Will stop prior to procedure) 180 tablet 3  . atorvastatin (LIPITOR) 40 MG tablet Take 1 tablet (40 mg total) by mouth daily. (Patient taking differently: Take 40 mg by mouth daily at 6 PM. ) 90 tablet 3  . bimatoprost (LUMIGAN) 0.03 % ophthalmic drops Place 1 drop into both eyes at bedtime.     Marland Kitchen diltiazem (CARDIZEM) 30 MG tablet Take 1 tablet  every 4 hours AS NEEDED for fasting heart rate >100 as long as blood pressure >100. (Patient taking differently: Take 30 mg by mouth See admin instructions. Take 1 tablet every 4 hours AS NEEDED for fasting heart rate >100 as long as blood pressure >100.) 45 tablet 3  . dorzolamide (TRUSOPT) 2 % ophthalmic solution Place 1 drop into both eyes 2 (two) times daily.    . furosemide (LASIX) 20 MG tablet Take 1 tablet (20 mg total) by mouth as directed. Take 2 tabs in the morning and 1 tab in the pm (Patient taking differently: Take 20-40 mg by mouth See admin instructions. Take 2 tabs in the morning and 1 tab in the pm) 270 tablet 3  . gabapentin (NEURONTIN) 400 MG capsule Take 1 capsule (400 mg total) by mouth 2 (two) times daily. 180 capsule 1  . loratadine (CLARITIN) 10 MG tablet Take 10 mg by mouth daily as needed for allergies. Reported on 09/16/2015    . Multiple Vitamins-Iron (MULTIVITAMIN/IRON) TABS Take 1 tablet by mouth daily.     Marland Kitchen omeprazole (PRILOSEC) 20 MG capsule TAKE 1 CAPSULE DAILY 30 MINUTES BEFORE A MEAL 90 capsule 1  . potassium chloride SA (K-DUR,KLOR-CON) 20 MEQ tablet Take 1 tablet (20 mEq total) by mouth daily. 30 tablet 11  . thyroid (ARMOUR THYROID) 60 MG tablet Take 1 tablet (60 mg total) by mouth daily before breakfast. 30 tablet 1  . traMADol (ULTRAM) 50 MG tablet Take 1 tablet (50 mg total) by mouth every 6 (six) hours as needed for moderate pain. 90 tablet 0   No current facility-administered medications for this encounter.     Allergies  Allergen Reactions  . Combigan [Brimonidine Tartrate-Timolol] Itching    Eyes itch, reddened  . Other Other (See Comments)    Per patient made OU red, Sore, and sensitivity to light  . Sulfamethoxazole-Trimethoprim Hives  . Sulfa Antibiotics Rash    Social History   Social History  . Marital status: Married    Spouse name: N/A  . Number of children: N/A  . Years of education: N/A   Occupational History  . Not on file.    Social History Main Topics  . Smoking status: Former Smoker    Packs/day: 0.75    Years: 5.00    Quit date: 06/14/1961  . Smokeless tobacco: Never Used  . Alcohol use 0.0 oz/week     Comment: occasional wine  . Drug use: No  . Sexual activity: Not on file   Other Topics Concern  . Not on file   Social History Narrative   Retired. Lives with husband.     Family History  Problem Relation Age of Onset  . Stroke Mother   . Hypertension Mother   . Hyperlipidemia Mother   . Stroke Father   . Transient ischemic attack Father   .  Hyperlipidemia Father   . Hypertension Father   . Atrial fibrillation Son   . Heart attack Neg Hx     ROS- All systems are reviewed and negative except as per the HPI above  Physical Exam: Vitals:   04/29/16 0917  BP: 118/82  Pulse: (!) 110  Weight: 140 lb (63.5 kg)  Height: '4\' 11"'$  (1.499 m)   Wt Readings from Last 3 Encounters:  04/29/16 140 lb (63.5 kg)  04/27/16 135 lb (61.2 kg)  04/16/16 138 lb 12.8 oz (63 kg)    Labs: Lab Results  Component Value Date   NA 146 (H) 04/24/2016   K 4.0 04/24/2016   CL 103 04/24/2016   CO2 28 04/24/2016   GLUCOSE 97 04/24/2016   BUN 22 04/24/2016   CREATININE 1.07 (H) 04/24/2016   CALCIUM 9.5 04/24/2016   MG 2.1 09/09/2015   Lab Results  Component Value Date   INR 1.0 04/24/2016   Lab Results  Component Value Date   CHOL 180 04/01/2016   HDL 67.20 04/01/2016   LDLCALC 94 04/01/2016   TRIG 96.0 04/01/2016     GEN- The patient is well appearing, alert and oriented x 3 today.   Head- normocephalic, atraumatic Eyes-  Sclera clear, conjunctiva pink Ears- hearing intact Oropharynx- clear Neck- supple, no JVP Lymph- no cervical lymphadenopathy Lungs- Clear to ausculation bilaterally, normal work of breathing Heart- Regular rate and rhythm, no murmurs, rubs or gallops, PMI not laterally displaced GI- soft, NT, ND, + BS Extremities- no clubbing, cyanosis, or edema MS- no significant  deformity or atrophy Skin- no rash or lesion Psych- euthymic mood, full affect Neuro- strength and sensation are intact  EKG-NSR ar 69 bpm, Pr int 148 ms, qrs itn 74 ms, qtc 409 ms Epic records reviewed Echo-04/09/16-Study Conclusions  - Left ventricle: The cavity size was normal. Wall thickness was   normal. Systolic function was normal. The estimated ejection   fraction was in the range of 55% to 60%. Wall motion was normal;   there were no regional wall motion abnormalities. Features are   consistent with a pseudonormal left ventricular filling pattern,   with concomitant abnormal relaxation and increased filling   pressure (grade 2 diastolic dysfunction). Doppler parameters are   consistent with high ventricular filling pressure. - Aortic valve: There was mild regurgitation. - Mitral valve: Calcified annulus. There was mild regurgitation. - Left atrium: The atrium was severely dilated.48 mm - Tricuspid valve: There was moderate regurgitation. - Pulmonary arteries: Systolic pressure was moderately increased.   PA peak pressure: 52 mm Hg (S).  Impressions:  - Normal LV systolic function; grade 2 diastolic dysfunction;   elevated LV filling pressure; mild AI; mild MR; severe LAE;   moderate TR with moderately elevated pulmonary pressure.  Rt heart cath-Conclusion   1. Mildly elevated right and left heart filling pressures.  2. Borderline pulmonary hypertension.   Overall, hemodynamics look ok. I do not think she has significant pulmonary hypertension.  However, she is in atrial fibrillation with RVR today.  She is not on rate control meds at home.  I will start diltiazem CD 180 mg daily, giving dose now.  She will restart Eliquis this evening.  I will have her followup in atrial fibrillation clinic.  If she does not revert to NSR, would consider DCCV.  She is, of note, not particularly symptomatic (did not know that she was back in atrial fibrillation).          Assessment and  Plan: 1. Paroxsymal afib Off amiodarone due to QTC prolongation, concern is that would happen with other antiarrythmic's Not a good ablation candidate due to left atrial size Held SR for some time off amiodarone, but now has returned Diltiazem just started at 180 mg a day x 2 doses Eliquis was interrupted Sunday x one day for rt heart cath  Will f/u in one week to reevaluate rhythm and rate, anticipate cardioversion but she did interrupt eliquis, will have to have 3 weeks of uninterrupted use or if becomes symptomatic, TEE guided cardioversion  Butch Penny C. Sohil Timko, [facility] [address] [place], [place] [address] [phone]

## 2016-04-29 NOTE — Telephone Encounter (Signed)
New message ° ° ° ° °Returning a call to the nurse to get lab results °

## 2016-05-01 ENCOUNTER — Telehealth: Payer: Self-pay | Admitting: Cardiology

## 2016-05-01 NOTE — Telephone Encounter (Signed)
Attempted to call pt back.  Phone rang several times with no answer and no VM option.  Unable to LVM.

## 2016-05-01 NOTE — Telephone Encounter (Signed)
New Message  Pt voiced wanting to speak with Lenice Llamas, but advised pt she's out of the office and would like for another nurse to give her a call.  Pt did not elaborate when I asked as to why.  Please f/u with pt

## 2016-05-04 ENCOUNTER — Other Ambulatory Visit (HOSPITAL_COMMUNITY): Payer: Medicare Other

## 2016-05-04 NOTE — Telephone Encounter (Signed)
Patient's husband called to report the patient is not taking Cardizem 30 mg as needed but is taking Cardizem CD 180 mg daily. He states Dr. Aundra Dubin started her on it when she was in afib the day of her cath. Confirmed OV in Afib Clinic this Thursday.  Med list updated.

## 2016-05-04 NOTE — Telephone Encounter (Signed)
New Message    Patient husband called stating that his wife's chart is not correct, and he needs to get it fixed. Per husband diltiazem (CARDIZEM) 30 MG tablet is showing up, and it's not the correct prescription dosage.He is requesting call back from nurse

## 2016-05-07 ENCOUNTER — Ambulatory Visit (HOSPITAL_COMMUNITY)
Admission: RE | Admit: 2016-05-07 | Discharge: 2016-05-07 | Disposition: A | Discharge status: Home / Self Care | Payer: Medicare Other | Source: Ambulatory Visit | Attending: Nurse Practitioner | Admitting: Nurse Practitioner

## 2016-05-07 ENCOUNTER — Encounter (HOSPITAL_COMMUNITY): Payer: Self-pay | Admitting: Nurse Practitioner

## 2016-05-07 VITALS — BP 124/82 | HR 118 | Ht 59.0 in | Wt 140.0 lb

## 2016-05-07 DIAGNOSIS — I272 Pulmonary hypertension, unspecified: Secondary | ICD-10-CM | POA: Insufficient documentation

## 2016-05-07 DIAGNOSIS — E042 Nontoxic multinodular goiter: Secondary | ICD-10-CM | POA: Diagnosis not present

## 2016-05-07 DIAGNOSIS — I481 Persistent atrial fibrillation: Secondary | ICD-10-CM | POA: Diagnosis not present

## 2016-05-07 DIAGNOSIS — E785 Hyperlipidemia, unspecified: Secondary | ICD-10-CM | POA: Insufficient documentation

## 2016-05-07 DIAGNOSIS — N183 Chronic kidney disease, stage 3 (moderate): Secondary | ICD-10-CM | POA: Insufficient documentation

## 2016-05-07 DIAGNOSIS — Z85118 Personal history of other malignant neoplasm of bronchus and lung: Secondary | ICD-10-CM | POA: Diagnosis not present

## 2016-05-07 DIAGNOSIS — K219 Gastro-esophageal reflux disease without esophagitis: Secondary | ICD-10-CM | POA: Diagnosis not present

## 2016-05-07 DIAGNOSIS — M8580 Other specified disorders of bone density and structure, unspecified site: Secondary | ICD-10-CM | POA: Diagnosis not present

## 2016-05-07 DIAGNOSIS — Z87891 Personal history of nicotine dependence: Secondary | ICD-10-CM | POA: Insufficient documentation

## 2016-05-07 DIAGNOSIS — I5032 Chronic diastolic (congestive) heart failure: Secondary | ICD-10-CM | POA: Diagnosis not present

## 2016-05-07 DIAGNOSIS — I4581 Long QT syndrome: Secondary | ICD-10-CM | POA: Diagnosis not present

## 2016-05-07 DIAGNOSIS — I130 Hypertensive heart and chronic kidney disease with heart failure and stage 1 through stage 4 chronic kidney disease, or unspecified chronic kidney disease: Secondary | ICD-10-CM | POA: Insufficient documentation

## 2016-05-07 DIAGNOSIS — Z8673 Personal history of transient ischemic attack (TIA), and cerebral infarction without residual deficits: Secondary | ICD-10-CM | POA: Diagnosis not present

## 2016-05-07 DIAGNOSIS — H409 Unspecified glaucoma: Secondary | ICD-10-CM | POA: Insufficient documentation

## 2016-05-07 DIAGNOSIS — K648 Other hemorrhoids: Secondary | ICD-10-CM | POA: Diagnosis not present

## 2016-05-07 DIAGNOSIS — I4819 Other persistent atrial fibrillation: Secondary | ICD-10-CM

## 2016-05-07 DIAGNOSIS — K449 Diaphragmatic hernia without obstruction or gangrene: Secondary | ICD-10-CM | POA: Insufficient documentation

## 2016-05-07 LAB — CBC
HCT: 42.8 % (ref 36.0–46.0)
HEMOGLOBIN: 14.5 g/dL (ref 12.0–15.0)
MCH: 31.7 pg (ref 26.0–34.0)
MCHC: 33.9 g/dL (ref 30.0–36.0)
MCV: 93.7 fL (ref 78.0–100.0)
Platelets: 262 10*3/uL (ref 150–400)
RBC: 4.57 MIL/uL (ref 3.87–5.11)
RDW: 13.2 % (ref 11.5–15.5)
WBC: 5 10*3/uL (ref 4.0–10.5)

## 2016-05-07 LAB — BASIC METABOLIC PANEL
ANION GAP: 9 (ref 5–15)
BUN: 20 mg/dL (ref 6–20)
CHLORIDE: 103 mmol/L (ref 101–111)
CO2: 27 mmol/L (ref 22–32)
Calcium: 9.1 mg/dL (ref 8.9–10.3)
Creatinine, Ser: 0.91 mg/dL (ref 0.44–1.00)
GFR calc Af Amer: >60 mL/min (ref >60)
Glucose, Bld: 122 mg/dL — ABNORMAL HIGH (ref 65–99)
POTASSIUM: 3.3 mmol/L — AB (ref 3.5–5.1)
SODIUM: 139 mmol/L (ref 135–145)

## 2016-05-07 NOTE — Progress Notes (Signed)
Primary Care Physician: Annye Asa, MD Cardiologist: Dr. Radford Pax Referring MD: Dr. Gordy Clement Patricia [name] is a 76 y.o. female with a h/o persisitent afib that with last f/u in the afib clinic 02/16/16 for f/u in the afib clinic. Since stopping amiodarone for prolonged QT, she had not had any further afib. She felt well.  She had a rt heart cath with Dr. Aundra Dubin on Monday,2/26, after echo showed pulmonary hypertension, and found to be in afib with RVR. She was asymptomatic. He started diltazem 180 mg which she has now taken twice. She missed one day of eliquis and restarted pm of cath. Dr. Aundra Dubin mentioned that [place] may be needed.  In the afib clinic,she is in afib at 110 bpm, again she cannot tell she is in afib. She feels well.  F/u in the afib clinic, 3/8, she is in rapid afib but after several minutes of sitting, HR decreased to 85-100. She still cannot tell she is in afib. Feels well. Will plan for cardioversion when she has been back on anticoagulation x 3 weeks which should be satisfied 3/19.  Today, she denies symptoms of palpitations, chest pain, shortness of breath, orthopnea, PND, lower extremity edema, dizziness, presyncope, syncope, or neurologic sequela. The patient is tolerating medications without difficulties and is otherwise without complaint today.   Past Medical History:  Diagnosis Date  . Allergic rhinitis, cause unspecified   . Aortic regurgitation 06/17/2015   Mild to moderate AR by echo 04/2015  . 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 Chronic cough   . Chronic diastolic heart failure ([place]) 06/17/2015  . CKD (chronic kidney disease), stage III   . Cough variant asthma   . Disease of pharynx or nasopharynx   . Diverticulosis   . Essential hypertension   . GERD (gastroesophageal reflux disease)   . Glaucoma   . Hiatal hernia   . Hyperlipidemia   . Internal hemorrhoids   . Laryngospasm   . Multinodular  goiter   . Osteopenia   . Persistent atrial fibrillation ([place])    a. Dx 04/2015 at time of stroke. b. DCCV 06/2015 - did not hold. Amio started then stopped due to QT prolongation.  Marland Kitchen Postmenopausal   . Radiation 10/22/14-11/23/14   Left Breast  . Stricture and stenosis of cervix    Past Surgical History:  Procedure Laterality Date  . BREAST SURGERY    . CARDIOVERSION N/A 06/24/2015   Procedure: CARDIOVERSION;  Surgeon: Sueanne Margarita, MD;  [facility];  Service: Cardiovascular;  Laterality: N/A;  . COLONOSCOPY    . LUNG CANCER SURGERY  2003   resection carcinoid lingula-lt upper lobe  . RADIOACTIVE SEED GUIDED MASTECTOMY WITH AXILLARY SENTINEL LYMPH NODE BIOPSY Left 06/26/2014   Procedure: RADIOACTIVE SEED GUIDED PARTIAL MASTECTOMY WITH AXILLARY SENTINEL LYMPH NODE BIOPSY;  Surgeon: Stark Klein, MD;  Location: [place];  Service: General;  Laterality: Left;  . RE-EXCISION OF BREAST LUMPECTOMY Left 08/07/2014   Procedure: RE-EXCISION OF LEFT BREAST LUMPECTOMY;  Surgeon: Stark Klein, MD;  Location: [place];  Service: General;  Laterality: Left;  . RIGHT HEART CATH N/A 04/27/2016   Procedure: Right Heart Cath;  Surgeon: Larey Dresser, MD;  Location: [place] CV LAB;  Service: Cardiovascular;  Laterality: N/A;    Current Outpatient Prescriptions  Medication Sig Dispense Refill  . ADVAIR HFA 230-21 MCG/ACT inhaler USE  2 INHALATIONS TWICE A DAY 36 g 1  . albuterol (PROVENTIL HFA;VENTOLIN HFA) 108 (90 Base) MCG/ACT inhaler Inhale 2 puffs into the lungs every 6 (six) hours as needed for wheezing or shortness of breath.    . anastrozole (ARIMIDEX) 1 MG tablet Take 1 tablet (1 mg total) by mouth daily. 90 tablet 3  . apixaban (ELIQUIS) 5 MG TABS tablet Take 1 tablet (5 mg total) by mouth 2 (two) times daily. (Patient taking differently: Take 5 mg by mouth 2 (two) times daily. Will stop prior to procedure) 180 tablet 3  . atorvastatin (LIPITOR) 40 MG  tablet Take 1 tablet (40 mg total) by mouth daily. (Patient taking differently: Take 40 mg by mouth daily at 6 PM. ) 90 tablet 3  . bimatoprost (LUMIGAN) 0.03 % ophthalmic drops Place 1 drop into both eyes at bedtime.     Marland Kitchen diltiazem (CARDIZEM CD) 180 MG 24 hr capsule Take 180 mg by mouth daily.    . dorzolamide (TRUSOPT) 2 % ophthalmic solution Place 1 drop into both eyes 2 (two) times daily.    . furosemide (LASIX) 20 MG tablet Take 1 tablet (20 mg total) by mouth as directed. Take 2 tabs in the morning and 1 tab in the pm (Patient taking differently: Take 20-40 mg by mouth See admin instructions. Take 2 tabs in the morning and 1 tab in the pm) 270 tablet 3  . gabapentin (NEURONTIN) 400 MG capsule Take 1 capsule (400 mg total) by mouth 2 (two) times daily. 180 capsule 1  . loratadine (CLARITIN) 10 MG tablet Take 10 mg by mouth daily as needed for allergies. Reported on 09/16/2015    . Multiple Vitamins-Iron (MULTIVITAMIN/IRON) TABS Take 1 tablet by mouth daily.     Marland Kitchen omeprazole (PRILOSEC) 20 MG capsule TAKE 1 CAPSULE DAILY 30 MINUTES BEFORE A MEAL 90 capsule 1  . potassium chloride SA (K-DUR,KLOR-CON) 20 MEQ tablet Take 1 tablet (20 mEq total) by mouth daily. 30 tablet 11  . thyroid (ARMOUR THYROID) 60 MG tablet Take 1 tablet (60 mg total) by mouth daily before breakfast. 30 tablet 1  . traMADol (ULTRAM) 50 MG tablet Take 1 tablet (50 mg total) by mouth every 6 (six) hours as needed for moderate pain. 90 tablet 0   No current facility-administered medications for this encounter.     Allergies  Allergen Reactions  . Combigan [Brimonidine Tartrate-Timolol] Itching    Eyes itch, reddened  . Other Other (See Comments)    Per patient made OU red, Sore, and sensitivity to light  . Sulfamethoxazole-Trimethoprim Hives  . Sulfa Antibiotics Rash    Social History   Social History  . Marital status: Married    Spouse name: N/A  . Number of children: N/A  . Years of education: N/A    Occupational History  . Not on file.   Social History Main Topics  . Smoking status: Former Smoker    Packs/day: 0.75    Years: 5.00    Quit date: 06/14/1961  . Smokeless tobacco: Never Used  . Alcohol use 0.0 oz/week     Comment: occasional wine  . Drug use: No  . Sexual activity: Not on file   Other Topics Concern  . Not on file   Social History Narrative   Retired. Lives with husband.     Family History  Problem Relation Age of Onset  . Stroke Mother   . Hypertension Mother   . Hyperlipidemia Mother   . Stroke  Father   . Transient ischemic attack Father   . Hyperlipidemia Father   . Hypertension Father   . Atrial fibrillation Son   . Heart attack Neg Hx     ROS- All systems are reviewed and negative except as per the HPI above  Physical Exam: Vitals:   05/07/16 1332  BP: 124/82  Pulse: (!) 118  Weight: 140 lb (63.5 kg)  Height: '4\' 11"'$  (1.499 m)   Wt Readings from Last 3 Encounters:  05/07/16 140 lb (63.5 kg)  04/29/16 140 lb (63.5 kg)  04/27/16 135 lb (61.2 kg)    Labs: Lab Results  Component Value Date   NA 146 (H) 04/24/2016   K 4.0 04/24/2016   CL 103 04/24/2016   CO2 28 04/24/2016   GLUCOSE 97 04/24/2016   BUN 22 04/24/2016   CREATININE 1.07 (H) 04/24/2016   CALCIUM 9.5 04/24/2016   MG 2.1 09/09/2015   Lab Results  Component Value Date   INR 1.0 04/24/2016   Lab Results  Component Value Date   CHOL 180 04/01/2016   HDL 67.20 04/01/2016   LDLCALC 94 04/01/2016   TRIG 96.0 04/01/2016     GEN- The patient is well appearing, alert and oriented x 3 today.   Head- normocephalic, atraumatic Eyes-  Sclera clear, conjunctiva pink Ears- hearing intact Oropharynx- clear Neck- supple, no JVP Lymph- no cervical lymphadenopathy Lungs- Clear to ausculation bilaterally, normal work of breathing Heart- Regular rate and rhythm, no murmurs, rubs or gallops, PMI not laterally displaced GI- soft, NT, ND, + BS Extremities- no clubbing,  cyanosis, or edema MS- no significant deformity or atrophy Skin- no rash or lesion Psych- euthymic mood, full affect Neuro- strength and sensation are intact  EKG-NSR ar 69 bpm, Pr int 148 ms, qrs itn 74 ms, qtc 409 ms Epic records reviewed Echo-04/09/16-Study Conclusions  - Left ventricle: The cavity size was normal. Wall thickness was   normal. Systolic function was normal. The estimated ejection   fraction was in the range of 55% to 60%. Wall motion was normal;   there were no regional wall motion abnormalities. Features are   consistent with a pseudonormal left ventricular filling pattern,   with concomitant abnormal relaxation and increased filling   pressure (grade 2 diastolic dysfunction). Doppler parameters are   consistent with high ventricular filling pressure. - Aortic valve: There was mild regurgitation. - Mitral valve: Calcified annulus. There was mild regurgitation. - Left atrium: The atrium was severely dilated.48 mm - Tricuspid valve: There was moderate regurgitation. - Pulmonary arteries: Systolic pressure was moderately increased.   PA peak pressure: 52 mm Hg (S).  Impressions:  - Normal LV systolic function; grade 2 diastolic dysfunction;   elevated LV filling pressure; mild AI; mild MR; severe LAE;   moderate TR with moderately elevated pulmonary pressure.  Rt heart cath-Conclusion   1. Mildly elevated right and left heart filling pressures.  2. Borderline pulmonary hypertension.   Overall, hemodynamics look ok. I do not think she has significant pulmonary hypertension.  However, she is in atrial fibrillation with RVR today.  She is not on rate control meds at home.  I will start diltiazem CD 180 mg daily, giving dose now.  She will restart Eliquis this evening.  I will have her followup in atrial fibrillation clinic.  If she does not revert to NSR, would consider DCCV.  She is, of note, not particularly symptomatic (did not know that she was back in atrial  fibrillation).  Assessment and Plan: 1. Paroxsymal afib Off amiodarone due to QTC prolongation, concern is that would happen with other antiarrythmic's Not a good ablation candidate due to left atrial size Held SR for some time off amiodarone, but now has returned to afib Continue cardizem 180 mg a day  Will have to have 3 weeks of uninterrupted [place] use, will schedule for cardioversion 3/20. I have asked her to check BP/HR at home,and make me aware if her heart rate is above 100, the majority of the time, for additional rate control.   Geroge Baseman Nidhi Jacome, [facility] [address] [place], [place] [address] [phone]

## 2016-05-07 NOTE — Patient Instructions (Signed)
Cardioversion scheduled for Tuesday, March 20th  - Arrive at the [facility] and go to admitting at 11:30AM  -Do not eat or drink anything after midnight the night prior to your procedure.  - Take all your medication with a sip of water prior to arrival.  - You will not be able to drive home after your procedure.

## 2016-05-14 ENCOUNTER — Ambulatory Visit (INDEPENDENT_AMBULATORY_CARE_PROVIDER_SITE_OTHER): Payer: Medicare Other | Admitting: Nurse Practitioner

## 2016-05-14 ENCOUNTER — Telehealth: Payer: Self-pay | Admitting: General Practice

## 2016-05-14 ENCOUNTER — Encounter: Payer: Self-pay | Admitting: Nurse Practitioner

## 2016-05-14 VITALS — BP 110/82 | HR 81 | Ht 59.0 in | Wt 138.4 lb

## 2016-05-14 DIAGNOSIS — E785 Hyperlipidemia, unspecified: Secondary | ICD-10-CM | POA: Diagnosis not present

## 2016-05-14 DIAGNOSIS — I10 Essential (primary) hypertension: Secondary | ICD-10-CM

## 2016-05-14 DIAGNOSIS — Z8673 Personal history of transient ischemic attack (TIA), and cerebral infarction without residual deficits: Secondary | ICD-10-CM | POA: Insufficient documentation

## 2016-05-14 DIAGNOSIS — I481 Persistent atrial fibrillation: Secondary | ICD-10-CM | POA: Diagnosis not present

## 2016-05-14 DIAGNOSIS — I4819 Other persistent atrial fibrillation: Secondary | ICD-10-CM

## 2016-05-14 MED ORDER — TRAMADOL HCL 50 MG PO TABS: 50.0000 mg | ORAL_TABLET | Freq: Four times a day (QID) | ORAL | 0 refills | Status: DC | PRN

## 2016-05-14 NOTE — Telephone Encounter (Signed)
Medication filled to pharmacy as requested.   

## 2016-05-14 NOTE — Progress Notes (Signed)
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 She is currently on eliquis for secondary stroke prevention and atrial fibrillation without recurrent stroke or TIA symptoms. She was found to be in atrial fib during cardiac cath last month, she is due for cardioversion next week by Dr. Radford Pax. She remains on Lipitor for hyperlipidemia without complaints of myalgias. Blood pressure the office today 110/82. She is continuing to exercise by playing golf several times a week and has to increase that as the weather improves. She is participating in her normal activities and feels she is back to baseline. She does however state her husband says she has some memory issues. It is noted that her recent TSH was 19.27 with adjustment in  medication She is not aware of her irregular heartbeat she returns for reevaluation   UPDATE 07/13/2017[name], 76 year old female returns for follow-up. She has a history of stroke event in February 2017. She has not had further stroke or TIA symptoms. She is currently on Eliquis for secondary stroke prevention and atrial fibrillation along with Lipitor. She recently had some fluid retention and was placed on Lasix. She says she is playing some golf and is back to most of her normal activities. She returns for reevaluation  HISTORY: 06/04/15 CMMary [name] is an 76 y.o. female returns for hospital follow-up after a stroke in February. She was originally admitted for evaluation of chest pain . On admission she stated that her speech had been slurred for 2 days. A  new diagnosis of paroxysmal atrial fibrillation was made. Given the slurred speech and atrial fibrillation, possible stroke was suspected. 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 Shower emboli were suspected based upon the overall MRI appearance. Patient was not considered for TPA secondary to delay in arrival. CT angiogram of the head and neck was unremarkable. 2-D echo no source of embolism. LDL 95. Hemoglobin A1c 5.7. She was switched to Eliquis from daily aspirin prior to admission. She was changed to Crestor for her LDL however her insurance would not cover this. She was placed on Lipitor 80 mg, but Dr. Radford Pax reduced to 40 mg at follow-up visit. She has not had further stroke or TIA symptoms. She does complain of some fatigue she returns for reevaluation REVIEW OF SYSTEMS: Full 14 system review of systems performed and notable only for those listed, all others are neg:  Constitutional: neg  Cardiovascular: For cardioversion next week   Ear/Nose/Throat: neg  Skin: neg Eyes: neg Respiratory: neg Gastroitestinal: neg  Hematology/Lymphatic: neg  Endocrine: neg Musculoskeletal:neg Allergy/Immunology: neg Neurological: Mild memory difficulties Psychiatric: neg Sleep : neg   ALLERGIES: Allergies  Allergen Reactions  . Combigan [Brimonidine Tartrate-Timolol] Itching    Eyes itch, reddened  . Other Other (See Comments)    Per patient made OU red, Sore, and sensitivity to light  . Sulfamethoxazole-Trimethoprim Hives  . Sulfa Antibiotics Rash    HOME MEDICATIONS: Outpatient Medications Prior to Visit  Medication Sig Dispense Refill  . ADVAIR HFA 230-21 MCG/ACT inhaler USE 2 INHALATIONS TWICE A DAY 36 g 1  . albuterol (PROVENTIL HFA;VENTOLIN HFA) 108 (90  Base) MCG/ACT inhaler Inhale 2 puffs into the lungs every 6 (six) hours as needed for wheezing or shortness of breath.    . anastrozole (ARIMIDEX) 1 MG tablet Take 1  tablet (1 mg total) by mouth daily. 90 tablet 3  . apixaban (ELIQUIS) 5 MG TABS tablet Take 1 tablet (5 mg total) by mouth 2 (two) times daily. (Patient taking differently: Take 5 mg by mouth 2 (two) times daily. Will stop prior to procedure) 180 tablet 3  . atorvastatin (LIPITOR) 40 MG tablet Take 1 tablet (40 mg total) by mouth daily. (Patient taking differently: Take 40 mg by mouth daily at 6 PM. ) 90 tablet 3  . bimatoprost (LUMIGAN) 0.03 % ophthalmic drops Place 1 drop into both eyes at bedtime.     Marland Kitchen diltiazem (CARDIZEM CD) 180 MG 24 hr capsule Take 180 mg by mouth daily.    . dorzolamide (TRUSOPT) 2 % ophthalmic solution Place 1 drop into both eyes 2 (two) times daily.    . furosemide (LASIX) 20 MG tablet Take 1 tablet (20 mg total) by mouth as directed. Take 2 tabs in the morning and 1 tab in the pm (Patient taking differently: Take 20-40 mg by mouth See admin instructions. Take 2 tabs in the morning and 1 tab in the pm) 270 tablet 3  . gabapentin (NEURONTIN) 400 MG capsule Take 1 capsule (400 mg total) by mouth 2 (two) times daily. 180 capsule 1  . loratadine (CLARITIN) 10 MG tablet Take 10 mg by mouth daily as needed for allergies. Reported on 09/16/2015    . Multiple Vitamins-Iron (MULTIVITAMIN/IRON) TABS Take 1 tablet by mouth daily.     Marland Kitchen omeprazole (PRILOSEC) 20 MG capsule TAKE 1 CAPSULE DAILY 30 MINUTES BEFORE A MEAL 90 capsule 1  . potassium chloride SA (K-DUR,KLOR-CON) 20 MEQ tablet Take 1 tablet (20 mEq total) by mouth daily. 30 tablet 11  . thyroid (ARMOUR THYROID) 60 MG tablet Take 1 tablet (60 mg total) by mouth daily before breakfast. 30 tablet 1  . traMADol (ULTRAM) 50 MG tablet Take 1 tablet (50 mg total) by mouth every 6 (six) hours as needed for moderate pain. 90 tablet 0   No facility-administered medications prior to visit.     PAST MEDICAL HISTORY: Past Medical History:  Diagnosis Date  . Allergic rhinitis, cause unspecified   . Aortic regurgitation 06/17/2015   Mild  to moderate AR by echo 04/2015  . 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 Chronic cough   . Chronic diastolic heart failure ([place]) 06/17/2015  . CKD (chronic kidney disease), stage III   . Cough variant asthma   . Disease of pharynx or nasopharynx   . Diverticulosis   . Essential hypertension   . GERD (gastroesophageal reflux disease)   . Glaucoma   . Hiatal hernia   . Hyperlipidemia   . Internal hemorrhoids   . Laryngospasm   . Multinodular goiter   . Osteopenia   . Persistent atrial fibrillation ([place])    a. Dx 04/2015 at time of stroke. b. DCCV 06/2015 - did not hold. Amio started then stopped due to QT prolongation.  Marland Kitchen Postmenopausal   . Radiation 10/22/14-11/23/14   Left Breast  . Stricture and stenosis of cervix     PAST SURGICAL HISTORY: Past Surgical History:  Procedure Laterality Date  . BREAST SURGERY    . CARDIOVERSION N/A 06/24/2015  Procedure: CARDIOVERSION;  Surgeon: Sueanne Margarita, MD;  Location[facility] ENDOSCOPY;  Service: Cardiovascular;  Laterality: N/A;  . COLONOSCOPY    . LUNG CANCER SURGERY  2003   resection carcinoid lingula-lt upper lobe  . RADIOACTIVE SEED GUIDED MASTECTOMY WITH AXILLARY SENTINEL LYMPH NODE BIOPSY Left 06/26/2014   Procedure: RADIOACTIVE SEED GUIDED PARTIAL MASTECTOMY WITH AXILLARY SENTINEL LYMPH NODE BIOPSY;  Surgeon: Stark Klein, MD;  Location: [place];  Service: General;  Laterality: Left;  . RE-EXCISION OF BREAST LUMPECTOMY Left 08/07/2014   Procedure: RE-EXCISION OF LEFT BREAST LUMPECTOMY;  Surgeon: Stark Klein, MD;  Location: [place];  Service: General;  Laterality: Left;  . RIGHT HEART CATH N/A 04/27/2016   Procedure: Right Heart Cath;  Surgeon: Larey Dresser, MD;  Location: [place] [facility];  Service: Cardiovascular;  Laterality: N/A;    FAMILY HISTORY: Family History  Problem Relation Age of Onset  . Stroke Mother   . Hypertension Mother   .  Hyperlipidemia Mother   . Stroke Father   . Transient ischemic attack Father   . Hyperlipidemia Father   . Hypertension Father   . Atrial fibrillation Son   . Heart attack Neg Hx     SOCIAL HISTORY: Social History   Social History  . Marital status: Married    Spouse name: N/A  . Number of children: N/A  . Years of education: N/A   Occupational History  . Not on file.   Social History Main Topics  . Smoking status: Former Smoker    Packs/day: 0.75    Years: 5.00    Quit date: 06/14/1961  . Smokeless tobacco: Never Used  . Alcohol use 0.0 oz/week     Comment: occasional wine  . Drug use: No  . Sexual activity: Not on file   Other Topics Concern  . Not on file   Social History Narrative   Retired. Lives with husband.      PHYSICAL EXAM  Vitals:   05/14/16 1043  BP: 110/82  Pulse: 81  Weight: 138 lb 6.4 oz (62.8 kg)  Height: '4\' 11"'$  (1.499 m)   Body mass index is 27.95 kg/m. Generalized: Well developed, in no acute distress  Head: normocephalic and atraumatic,. Oropharynx benign  Neck: Supple, no carotid bruits  Cardiac: irregular rate and rhythm no murmur Skin no peripheral edema  Musculoskeletal: No deformity   Neurological examination   Mentation: Alert oriented to time, place, history taking. Attention span and concentration appropriate. Recent and remote memory intact. Follows all commands speech and language fluent.MMSE 28/30. AFT 7. Clock drawing 4/4   Cranial nerve II-XII: Pupils were equal round reactive to light extraocular movements were full, visual field were full on confrontational test. Facial sensation and strength were normal. hearing was intact to finger rubbing bilaterally. Uvula tongue midline. head turning and shoulder shrug were normal and symmetric.Tongue protrusion into cheek strength was normal. Motor: normal bulk and tone, full strength in the BUE, BLE, fine finger movements normal, no pronator drift. No focal weakness Sensory:  normal and symmetric to light touch, pinprick and vibratory in the upper and lower extremities  Coordination: finger-nose-finger, heel-to-shin bilaterally, no dysmetria Reflexes: 1+ upper lower and symmetric plantar responses were flexor bilaterally. Gait and Station: Rising up from seated position without assistance, normal stance, moderate stride, good arm swing, smooth turning, able to perform tiptoe, and heel walking without difficulty. Tandem gait is steady  DIAGNOSTIC DATA (LABS, IMAGING, TESTING) - I reviewed patient records,  labs, notes, testing and imaging myself where available.  Lab Results  Component Value Date   WBC 5.0 05/07/2016   HGB 14.5 05/07/2016   HCT 42.8 05/07/2016   MCV 93.7 05/07/2016   PLT 262 05/07/2016      Component Value Date/Time   NA 139 05/07/2016 1435   NA 146 (H) 04/24/2016 1515   NA 141 03/24/2016 1435   K 3.3 (L) 05/07/2016 1435   K 3.4 (L) 03/24/2016 1435   CL 103 05/07/2016 1435   CO2 27 05/07/2016 1435   CO2 28 03/24/2016 1435   GLUCOSE 122 (H) 05/07/2016 1435   GLUCOSE 110 03/24/2016 1435   GLUCOSE 88 01/13/2006 1012   BUN 20 05/07/2016 1435   BUN 22 04/24/2016 1515   BUN 18.9 03/24/2016 1435   CREATININE 0.91 05/07/2016 1435   CREATININE 1.1 03/24/2016 1435   CALCIUM 9.1 05/07/2016 1435   CALCIUM 9.3 03/24/2016 1435   PROT 7.0 04/01/2016 0952   PROT 7.1 03/24/2016 1435   ALBUMIN 4.3 04/01/2016 0952   ALBUMIN 3.8 03/24/2016 1435   AST 18 04/01/2016 0952   AST 18 03/24/2016 1435   ALT 16 04/01/2016 0952   ALT 17 03/24/2016 1435   ALKPHOS 70 04/01/2016 0952   ALKPHOS 76 03/24/2016 1435   BILITOT 0.9 04/01/2016 0952   BILITOT 0.90 03/24/2016 1435   GFRNONAA >60 05/07/2016 1435   GFRAA >60 05/07/2016 1435   Lab Results  Component Value Date   CHOL 180 04/01/2016   HDL 67.20 04/01/2016   LDLCALC 94 04/01/2016   LDLDIRECT 174.3 06/04/2009   TRIG 96.0 04/01/2016   CHOLHDL 3 04/01/2016   Lab Results  Component Value Date     HGBA1C 5.7 (H) 04/29/2015   No results found for: VITAMINB12 Lab Results  Component Value Date   TSH 19.27 (H) 04/01/2016      ASSESSMENT AND PLAN 76 y.o. year old female returns for follow-up with history of  right MCA infarct secondary to newly diagnosed atrial fibrillation.She has risk factors of hypertension, hyperlipidemia. She reports mild memory problems today. In reviewing the chart recent TSH 19.27. The patient is a current patient of Dr. Erlinda Hong  who is out of the office today . This note is sent to the work in doctor.     PLAN: Continue to Monitor stroke risk factors Continue Eliquis for atrial fibrillation and secondary stroke prevention Blood pressure in excellent control today 779/39 keep systolic less than 030 continue present management Continue statin Lipitor 40 mg daily for secondary stroke prevention  Heart healthy low sodium diet Continue exercise as tolerated Stay well hydrated Follow-up in 6 months to recheck memory, if stable distress at that time Greater than 50% of time during this 25 minute visit was spent on counseling,explanation of diagnosis, planning of further management, discussion with patient  and coordination of care and answering her questions about memory loss and thyroid disease. Dennie Bible, [facility], [facility], APRN  [facility] [address] [place],  [address] [phone]

## 2016-05-14 NOTE — Patient Instructions (Addendum)
Continue to Monitor stroke risk factors Continue Eliquis for atrial fibrillation and secondary stroke prevention Blood pressure in excellent control today 643/83 keep systolic less than 779 continue present management Continue statin Lipitor 40 mg daily for secondary stroke prevention  Heart healthy low sodium diet Continue exercise as tolerated Stay well hydrated Follow-up in 6 months to recheck memory

## 2016-05-14 NOTE — Telephone Encounter (Signed)
Last OV 04/01/16 Tramadol last filled 03/27/16 #90 with 0

## 2016-05-14 NOTE — Telephone Encounter (Signed)
Ok for #90, no refills 

## 2016-05-15 NOTE — Progress Notes (Signed)
I agree with the above plan 

## 2016-05-19 ENCOUNTER — Ambulatory Visit (HOSPITAL_COMMUNITY): Payer: Medicare Other | Admitting: Certified Registered Nurse Anesthetist

## 2016-05-19 ENCOUNTER — Encounter (HOSPITAL_COMMUNITY)
Admission: RE | Disposition: A | Discharge status: Home / Self Care | Payer: Self-pay | Source: Ambulatory Visit | Attending: Cardiology

## 2016-05-19 ENCOUNTER — Encounter (HOSPITAL_COMMUNITY): Payer: Self-pay | Admitting: *Deleted

## 2016-05-19 ENCOUNTER — Ambulatory Visit (HOSPITAL_COMMUNITY)
Admission: RE | Admit: 2016-05-19 | Discharge: 2016-05-19 | Disposition: A | Discharge status: Home / Self Care | Payer: Medicare Other | Source: Ambulatory Visit | Attending: Cardiology | Admitting: Cardiology

## 2016-05-19 DIAGNOSIS — I130 Hypertensive heart and chronic kidney disease with heart failure and stage 1 through stage 4 chronic kidney disease, or unspecified chronic kidney disease: Secondary | ICD-10-CM | POA: Insufficient documentation

## 2016-05-19 DIAGNOSIS — Z888 Allergy status to other drugs, medicaments and biological substances status: Secondary | ICD-10-CM | POA: Diagnosis not present

## 2016-05-19 DIAGNOSIS — I481 Persistent atrial fibrillation: Secondary | ICD-10-CM | POA: Insufficient documentation

## 2016-05-19 DIAGNOSIS — Z7951 Long term (current) use of inhaled steroids: Secondary | ICD-10-CM | POA: Insufficient documentation

## 2016-05-19 DIAGNOSIS — K219 Gastro-esophageal reflux disease without esophagitis: Secondary | ICD-10-CM | POA: Insufficient documentation

## 2016-05-19 DIAGNOSIS — N183 Chronic kidney disease, stage 3 (moderate): Secondary | ICD-10-CM | POA: Diagnosis not present

## 2016-05-19 DIAGNOSIS — Z79899 Other long term (current) drug therapy: Secondary | ICD-10-CM | POA: Diagnosis not present

## 2016-05-19 DIAGNOSIS — I272 Pulmonary hypertension, unspecified: Secondary | ICD-10-CM | POA: Insufficient documentation

## 2016-05-19 DIAGNOSIS — H409 Unspecified glaucoma: Secondary | ICD-10-CM | POA: Diagnosis not present

## 2016-05-19 DIAGNOSIS — I4891 Unspecified atrial fibrillation: Secondary | ICD-10-CM | POA: Diagnosis present

## 2016-05-19 DIAGNOSIS — Z87891 Personal history of nicotine dependence: Secondary | ICD-10-CM | POA: Diagnosis not present

## 2016-05-19 DIAGNOSIS — Z79811 Long term (current) use of aromatase inhibitors: Secondary | ICD-10-CM | POA: Diagnosis not present

## 2016-05-19 DIAGNOSIS — Z882 Allergy status to sulfonamides status: Secondary | ICD-10-CM | POA: Insufficient documentation

## 2016-05-19 DIAGNOSIS — E785 Hyperlipidemia, unspecified: Secondary | ICD-10-CM | POA: Insufficient documentation

## 2016-05-19 DIAGNOSIS — I5032 Chronic diastolic (congestive) heart failure: Secondary | ICD-10-CM | POA: Diagnosis not present

## 2016-05-19 DIAGNOSIS — Z7901 Long term (current) use of anticoagulants: Secondary | ICD-10-CM | POA: Diagnosis not present

## 2016-05-19 DIAGNOSIS — I10 Essential (primary) hypertension: Secondary | ICD-10-CM | POA: Diagnosis not present

## 2016-05-19 HISTORY — PX: CARDIOVERSION: SHX1299

## 2016-05-19 LAB — POCT I-STAT, CHEM 8
BUN: 18 mg/dL (ref 6–20)
CREATININE: 0.9 mg/dL (ref 0.44–1.00)
Calcium, Ion: 1.12 mmol/L — ABNORMAL LOW (ref 1.15–1.40)
Chloride: 105 mmol/L (ref 101–111)
Glucose, Bld: 109 mg/dL — ABNORMAL HIGH (ref 65–99)
HCT: 42 % (ref 36.0–46.0)
Hemoglobin: 14.3 g/dL (ref 12.0–15.0)
Potassium: 3.5 mmol/L (ref 3.5–5.1)
Sodium: 143 mmol/L (ref 135–145)
TCO2: 27 mmol/L (ref 0–100)

## 2016-05-19 SURGERY — CARDIOVERSION
Anesthesia: General

## 2016-05-19 MED ORDER — POTASSIUM CHLORIDE CRYS ER 20 MEQ PO TBCR: 20.0000 meq | EXTENDED_RELEASE_TABLET | Freq: Two times a day (BID) | ORAL | 3 refills | Status: DC

## 2016-05-19 MED ORDER — PROPOFOL 10 MG/ML IV BOLUS
INTRAVENOUS | Status: DC | PRN
  Administered 2016-05-19: 80 mg via INTRAVENOUS

## 2016-05-19 MED ORDER — SODIUM CHLORIDE 0.9 % IV SOLN
INTRAVENOUS | Status: DC
  Administered 2016-05-19: 12:00:00 via INTRAVENOUS

## 2016-05-19 MED ORDER — LIDOCAINE 2% (20 MG/ML) 5 ML SYRINGE
INTRAMUSCULAR | Status: DC | PRN
  Administered 2016-05-19: 100 mg via INTRAVENOUS

## 2016-05-19 MED ORDER — PHENYLEPHRINE 40 MCG/ML (10ML) SYRINGE FOR IV PUSH (FOR BLOOD PRESSURE SUPPORT)
PREFILLED_SYRINGE | INTRAVENOUS | Status: DC | PRN
  Administered 2016-05-19 (×2): 120 ug via INTRAVENOUS

## 2016-05-19 NOTE — H&P (View-Only) (Signed)
Primary Care Physician: Patricia Asa, MD Cardiologist: Dr. Radford [name] Referring MD: Dr. Gordy [name] Patricia [name] is a 76 y.o. female with a h/o persisitent afib that with last f/u in the afib clinic 02/16/16 for f/u in the afib clinic. Since stopping amiodarone for prolonged QT, she had not had any further afib. She felt well.  She had a rt heart cath with Dr. Aundra [name] on Monday,2/26, after echo showed pulmonary hypertension, and found to be in afib with RVR. She was asymptomatic. He started diltazem 180 mg which she has now taken twice. She missed one day of eliquis and restarted pm of cath. Dr. Aundra [name] mentioned that [place] may be needed.  In the afib clinic,she is in afib at 110 bpm, again she cannot tell she is in afib. She feels well.  F/u in the afib clinic, 3/8, she is in rapid afib but after several minutes of sitting, HR decreased to 85-100. She still cannot tell she is in afib. Feels well. Will plan for cardioversion when she has been back on anticoagulation x 3 weeks which should be satisfied 3/19.  Today, she denies symptoms of palpitations, chest pain, shortness of breath, orthopnea, PND, lower extremity edema, dizziness, presyncope, syncope, or neurologic sequela. The patient is tolerating medications without difficulties and is otherwise without complaint today.   Past Medical History:  Diagnosis Date  . Allergic rhinitis, cause unspecified   . Aortic regurgitation 06/17/2015   Mild to moderate AR by echo 04/2015  . 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 Chronic cough   . Chronic diastolic heart failure ([place]) 06/17/2015  . CKD (chronic kidney disease), stage III   . Cough variant asthma   . Disease of pharynx or nasopharynx   . Diverticulosis   . Essential hypertension   . GERD (gastroesophageal reflux disease)   . Glaucoma   . Hiatal hernia   . Hyperlipidemia   . Internal hemorrhoids   . Laryngospasm   . Multinodular  goiter   . Osteopenia   . Persistent atrial fibrillation ([place])    a. Dx 04/2015 at time of stroke. b. DCCV 06/2015 - did not hold. Amio started then stopped due to QT prolongation.  Marland Kitchen Postmenopausal   . Radiation 10/22/14-11/23/14   Left Breast  . Stricture and stenosis of cervix    Past Surgical History:  Procedure Laterality Date  . BREAST SURGERY    . CARDIOVERSION N/A 06/24/2015   Procedure: CARDIOVERSION;  Surgeon: Patricia Margarita, MD;  Location: [facility];  Service: Cardiovascular;  Laterality: N/A;  . COLONOSCOPY    . LUNG CANCER SURGERY  2003   resection carcinoid lingula-lt upper lobe  . RADIOACTIVE SEED GUIDED MASTECTOMY WITH AXILLARY SENTINEL LYMPH NODE BIOPSY Left 06/26/2014   Procedure: RADIOACTIVE SEED GUIDED PARTIAL MASTECTOMY WITH AXILLARY SENTINEL LYMPH NODE BIOPSY;  Surgeon: Patricia Klein, MD;  Location: [place];  Service: General;  Laterality: Left;  . RE-EXCISION OF BREAST LUMPECTOMY Left 08/07/2014   Procedure: RE-EXCISION OF LEFT BREAST LUMPECTOMY;  Surgeon: Patricia Klein, MD;  Location: [place];  Service: General;  Laterality: Left;  . RIGHT HEART CATH N/A 04/27/2016   Procedure: Right Heart Cath;  Surgeon: Patricia Dresser, MD;  Location: [place] [facility];  Service: Cardiovascular;  Laterality: N/A;    Current Outpatient Prescriptions  Medication Sig Dispense Refill  . ADVAIR HFA 230-21 MCG/ACT inhaler USE  2 INHALATIONS TWICE A DAY 36 g 1  . albuterol (PROVENTIL HFA;VENTOLIN HFA) 108 (90 Base) MCG/ACT inhaler Inhale 2 puffs into the lungs every 6 (six) hours as needed for wheezing or shortness of breath.    . anastrozole (ARIMIDEX) 1 MG tablet Take 1 tablet (1 mg total) by mouth daily. 90 tablet 3  . apixaban (ELIQUIS) 5 MG TABS tablet Take 1 tablet (5 mg total) by mouth 2 (two) times daily. (Patient taking differently: Take 5 mg by mouth 2 (two) times daily. Will stop prior to procedure) 180 tablet 3  . atorvastatin (LIPITOR) 40 MG  tablet Take 1 tablet (40 mg total) by mouth daily. (Patient taking differently: Take 40 mg by mouth daily at 6 PM. ) 90 tablet 3  . bimatoprost (LUMIGAN) 0.03 % ophthalmic drops Place 1 drop into both eyes at bedtime.     Marland Kitchen diltiazem (CARDIZEM CD) 180 MG 24 hr capsule Take 180 mg by mouth daily.    . dorzolamide (TRUSOPT) 2 % ophthalmic solution Place 1 drop into both eyes 2 (two) times daily.    . furosemide (LASIX) 20 MG tablet Take 1 tablet (20 mg total) by mouth as directed. Take 2 tabs in the morning and 1 tab in the pm (Patient taking differently: Take 20-40 mg by mouth See admin instructions. Take 2 tabs in the morning and 1 tab in the pm) 270 tablet 3  . gabapentin (NEURONTIN) 400 MG capsule Take 1 capsule (400 mg total) by mouth 2 (two) times daily. 180 capsule 1  . loratadine (CLARITIN) 10 MG tablet Take 10 mg by mouth daily as needed for allergies. Reported on 09/16/2015    . Multiple Vitamins-Iron (MULTIVITAMIN/IRON) TABS Take 1 tablet by mouth daily.     Marland Kitchen omeprazole (PRILOSEC) 20 MG capsule TAKE 1 CAPSULE DAILY 30 MINUTES BEFORE A MEAL 90 capsule 1  . potassium chloride SA (K-DUR,KLOR-CON) 20 MEQ tablet Take 1 tablet (20 mEq total) by mouth daily. 30 tablet 11  . thyroid (ARMOUR THYROID) 60 MG tablet Take 1 tablet (60 mg total) by mouth daily before breakfast. 30 tablet 1  . traMADol (ULTRAM) 50 MG tablet Take 1 tablet (50 mg total) by mouth every 6 (six) hours as needed for moderate pain. 90 tablet 0   No current facility-administered medications for this encounter.     Allergies  Allergen Reactions  . Combigan [Brimonidine Tartrate-Timolol] Itching    Eyes itch, reddened  . Other Other (See Comments)    Per patient made OU red, Sore, and sensitivity to light  . Sulfamethoxazole-Trimethoprim Hives  . Sulfa Antibiotics Rash    Social History   Social History  . Marital status: Married    Spouse name: N/A  . Number of children: N/A  . Years of education: N/A    Occupational History  . Not on file.   Social History Main Topics  . Smoking status: Former Smoker    Packs/day: 0.75    Years: 5.00    Quit date: 06/14/1961  . Smokeless tobacco: Never Used  . Alcohol use 0.0 oz/week     Comment: occasional wine  . Drug use: No  . Sexual activity: Not on file   Other Topics Concern  . Not on file   Social History Narrative   Retired. Lives with husband.     Family History  Problem Relation Age of Onset  . Stroke Mother   . Hypertension Mother   . Hyperlipidemia Mother   . Stroke  Father   . Transient ischemic attack Father   . Hyperlipidemia Father   . Hypertension Father   . Atrial fibrillation Son   . Heart attack Neg Hx     ROS- All systems are reviewed and negative except as per the HPI above  Physical Exam: Vitals:   05/07/16 1332  BP: 124/82  Pulse: (!) 118  Weight: 140 lb (63.5 kg)  Height: '4\' 11"'$  (1.499 m)   Wt Readings from Last 3 Encounters:  05/07/16 140 lb (63.5 kg)  04/29/16 140 lb (63.5 kg)  04/27/16 135 lb (61.2 kg)    Labs: Lab Results  Component Value Date   NA 146 (H) 04/24/2016   K 4.0 04/24/2016   CL 103 04/24/2016   CO2 28 04/24/2016   GLUCOSE 97 04/24/2016   BUN 22 04/24/2016   CREATININE 1.07 (H) 04/24/2016   CALCIUM 9.5 04/24/2016   MG 2.1 09/09/2015   Lab Results  Component Value Date   INR 1.0 04/24/2016   Lab Results  Component Value Date   CHOL 180 04/01/2016   HDL 67.20 04/01/2016   LDLCALC 94 04/01/2016   TRIG 96.0 04/01/2016     GEN- The patient is well appearing, alert and oriented x 3 today.   Head- normocephalic, atraumatic Eyes-  Sclera clear, conjunctiva pink Ears- hearing intact Oropharynx- clear Neck- supple, no JVP Lymph- no cervical lymphadenopathy Lungs- Clear to ausculation bilaterally, normal work of breathing Heart- Regular rate and rhythm, no murmurs, rubs or gallops, PMI not laterally displaced GI- soft, NT, ND, + BS Extremities- no clubbing,  cyanosis, or edema MS- no significant deformity or atrophy Skin- no rash or lesion Psych- euthymic mood, full affect Neuro- strength and sensation are intact  EKG-NSR ar 69 bpm, Pr int 148 ms, qrs itn 74 ms, qtc 409 ms Epic records reviewed Echo-04/09/16-Study Conclusions  - Left ventricle: The cavity size was normal. Wall thickness was   normal. Systolic function was normal. The estimated ejection   fraction was in the range of 55% to 60%. Wall motion was normal;   there were no regional wall motion abnormalities. Features are   consistent with a pseudonormal left ventricular filling pattern,   with concomitant abnormal relaxation and increased filling   pressure (grade 2 diastolic dysfunction). Doppler parameters are   consistent with high ventricular filling pressure. - Aortic valve: There was mild regurgitation. - Mitral valve: Calcified annulus. There was mild regurgitation. - Left atrium: The atrium was severely dilated.48 mm - Tricuspid valve: There was moderate regurgitation. - Pulmonary arteries: Systolic pressure was moderately increased.   PA peak pressure: 52 mm Hg (S).  Impressions:  - Normal LV systolic function; grade 2 diastolic dysfunction;   elevated LV filling pressure; mild AI; mild MR; severe LAE;   moderate TR with moderately elevated pulmonary pressure.  Rt heart cath-Conclusion   1. Mildly elevated right and left heart filling pressures.  2. Borderline pulmonary hypertension.   Overall, hemodynamics look ok. I do not think she has significant pulmonary hypertension.  However, she is in atrial fibrillation with RVR today.  She is not on rate control meds at home.  I will start diltiazem CD 180 mg daily, giving dose now.  She will restart Eliquis this evening.  I will have her followup in atrial fibrillation clinic.  If she does not revert to NSR, would consider DCCV.  She is, of note, not particularly symptomatic (did not know that she was back in atrial  fibrillation).  Assessment and Plan: 1. Paroxsymal afib Off amiodarone due to QTC prolongation, concern is that would happen with other antiarrythmic's Not a good ablation candidate due to left atrial size Held SR for some time off amiodarone, but now has returned to afib Continue cardizem 180 mg a day  Will have to have 3 weeks of uninterrupted Doac use, will schedule for cardioversion 3/20. I have asked her to check BP/HR at home,and make me aware if her heart rate is above 100, the majority of the time, for additional rate control.   Geroge Baseman Meiling Hendriks, [facility] [address] [place], [place] [address] [phone]

## 2016-05-19 NOTE — CV Procedure (Addendum)
   Electrical Cardioversion Procedure Note Patricia [name] [id number] January 08, 1941  Procedure: Electrical Cardioversion Indications:  Atrial Fibrillation  Time Out: Verified patient identification, verified procedure,medications/allergies/relevent history reviewed, required imaging and test results available.  Performed  Procedure Details  The patient was NPO after midnight. Anesthesia was administered at the beside  by [facility] with '80mg'$  of propofol and '100mg'$  Lidocaine.  Cardioversion was done with synchronized biphasic defibrillation with AP pads with 150watts.  The patient converted to normal sinus rhythm. The patient tolerated the procedure well   IMPRESSION:  Successful cardioversion of atrial fibrillation  Patient noted to have K+ of 3.5.  I have instructed patient to increase Kdur to 24mq BID and will set up BMET in 1 week.      Patricia [name] 05/19/2016, 11:28 AM

## 2016-05-19 NOTE — Anesthesia Postprocedure Evaluation (Signed)
Anesthesia Post Note  Patient: Patricia [name]  Procedure(s) Performed: Procedure(s) (LRB): CARDIOVERSION (N/A)  Patient location during evaluation: PACU Anesthesia Type: General Level of consciousness: awake and alert Pain management: pain level controlled Vital Signs Assessment: post-procedure vital signs reviewed and stable Respiratory status: spontaneous breathing, nonlabored ventilation, respiratory function stable and patient connected to nasal cannula oxygen Cardiovascular status: blood pressure returned to baseline and stable Postop Assessment: no signs of nausea or vomiting Anesthetic complications: no       Last Vitals:  Vitals:   05/19/16 1305 05/19/16 1315  BP: 104/82 103/76  Pulse: 74 71  Resp: 15 19  Temp:      Last Pain:  Vitals:   05/19/16 1248  TempSrc: Oral                 [name] DAVID

## 2016-05-19 NOTE — Anesthesia Preprocedure Evaluation (Signed)
Anesthesia Evaluation  Patient identified by MRN, date of birth, ID band Patient awake    Reviewed: Allergy & Precautions, NPO status , Patient's Chart, lab work & pertinent test results  Airway Mallampati: I  TM Distance: >3 FB Neck ROM: Full    Dental   Pulmonary former smoker,    Pulmonary exam normal        Cardiovascular hypertension, Normal cardiovascular exam     Neuro/Psych    GI/Hepatic GERD  Medicated and Controlled,  Endo/Other    Renal/GU Renal InsufficiencyRenal disease     Musculoskeletal   Abdominal   Peds  Hematology   Anesthesia Other Findings   Reproductive/Obstetrics                             Anesthesia Physical Anesthesia Plan  ASA: III  Anesthesia Plan: General   Post-op Pain Management:    Induction: Intravenous  Airway Management Planned: Mask  Additional Equipment:   Intra-op Plan:   Post-operative Plan:   Informed Consent: I have reviewed the patients History and Physical, chart, labs and discussed the procedure including the risks, benefits and alternatives for the proposed anesthesia with the patient or authorized representative who has indicated his/her understanding and acceptance.     Plan Discussed with: CRNA and Surgeon  Anesthesia Plan Comments:         Anesthesia Quick Evaluation

## 2016-05-19 NOTE — Interval H&P Note (Signed)
History and Physical Interval Note:  05/19/2016 11:27 AM  Patricia [name]  has presented today for surgery, with the diagnosis of AFIB  The various methods of treatment have been discussed with the patient and family. After consideration of risks, benefits and other options for treatment, the patient has consented to  Procedure(s): CARDIOVERSION (N/A) as a surgical intervention .  The patient's history has been reviewed, patient examined, no change in status, stable for surgery.  I have reviewed the patient's chart and labs.  Questions were answered to the patient's satisfaction.     Fransico Him

## 2016-05-19 NOTE — Transfer of Care (Signed)
Immediate Anesthesia Transfer of Care Note  Patient: Patricia [name]  Procedure(s) Performed: Procedure(s): CARDIOVERSION (N/A)  Patient Location: PACU  Anesthesia Type:General  Level of Consciousness: awake, alert , oriented and patient cooperative  Airway & Oxygen Therapy: Patient Spontanous Breathing and Patient connected to nasal cannula oxygen  Post-op Assessment: Report given to RN and Post -op Vital signs reviewed and stable  Post vital signs: Reviewed and stable  Last Vitals:  Vitals:   05/19/16 1244 05/19/16 1245  BP:  107/69  Pulse: 65 68  Resp: 17 18    Last Pain:  Vitals:   05/19/16 1139  TempSrc: Oral         Complications: No apparent anesthesia complications

## 2016-05-19 NOTE — Anesthesia Procedure Notes (Signed)
Date/Time: 05/19/2016 12:35 PM Performed by: Everlean Cherry A Pre-anesthesia Checklist: Patient identified, Emergency Drugs available, Patient being monitored and Suction available Patient Re-evaluated:Patient Re-evaluated prior to inductionOxygen Delivery Method: Ambu bag Preoxygenation: Pre-oxygenation with 100% oxygen Intubation Type: IV induction Ventilation: Mask ventilation without difficulty

## 2016-05-19 NOTE — Discharge Instructions (Signed)
Electrical Cardioversion, Care After °This sheet gives you information about how to care for yourself after your procedure. Your health care provider may also give you more specific instructions. If you have problems or questions, contact your health care provider. °What can I expect after the procedure? °After the procedure, it is common to have: °· Some redness on the skin where the shocks were given. °Follow these instructions at home: °· Do not drive for 24 hours if you were given a medicine to help you relax (sedative). °· Take over-the-counter and prescription medicines only as told by your health care provider. °· Ask your health care provider how to check your pulse. Check it often. °· Rest for 48 hours after the procedure or as told by your health care provider. °· Avoid or limit your caffeine use as told by your health care provider. °Contact a health care provider if: °· You feel like your heart is beating too quickly or your pulse is not regular. °· You have a serious muscle cramp that does not go away. °Get help right away if: °· You have discomfort in your chest. °· You are dizzy or you feel faint. °· You have trouble breathing or you are short of breath. °· Your speech is slurred. °· You have trouble moving an arm or leg on one side of your body. °· Your fingers or toes turn cold or blue. °This information is not intended to replace advice given to you by your health care provider. Make sure you discuss any questions you have with your health care provider. °Document Released: 12/07/2012 Document Revised: 09/20/2015 Document Reviewed: 08/23/2015 °Elsevier Interactive Patient Education © 2017 [employer] ° °

## 2016-05-20 ENCOUNTER — Telehealth: Payer: Self-pay

## 2016-05-20 ENCOUNTER — Telehealth: Payer: Self-pay | Admitting: Family Medicine

## 2016-05-20 ENCOUNTER — Other Ambulatory Visit: Payer: Self-pay | Admitting: *Deleted

## 2016-05-20 DIAGNOSIS — I351 Nonrheumatic aortic (valve) insufficiency: Secondary | ICD-10-CM

## 2016-05-20 DIAGNOSIS — I10 Essential (primary) hypertension: Secondary | ICD-10-CM

## 2016-05-20 DIAGNOSIS — I5032 Chronic diastolic (congestive) heart failure: Secondary | ICD-10-CM

## 2016-05-20 DIAGNOSIS — I4819 Other persistent atrial fibrillation: Secondary | ICD-10-CM

## 2016-05-20 MED ORDER — GABAPENTIN 400 MG PO CAPS: 400.0000 mg | ORAL_CAPSULE | Freq: Two times a day (BID) | ORAL | 1 refills | Status: DC

## 2016-05-20 NOTE — Telephone Encounter (Signed)
Sueanne Margarita, MD  Theodoro Parma, RN        Patient told to increase Kdur to 6mq BID. Please set up BMET in 1 week     [facility] Spoke with SMarzetta Board RN who states BMET will be drawn at that time.

## 2016-05-20 NOTE — Telephone Encounter (Signed)
Pt asking that gabapentin be sent in for a [facility] day supply [facility].

## 2016-05-20 NOTE — Telephone Encounter (Signed)
Ok to send 90 day supply w/ 1 refill

## 2016-05-20 NOTE — Telephone Encounter (Signed)
Medication filled to pharmacy as requested.   

## 2016-05-21 ENCOUNTER — Telehealth: Payer: Self-pay | Admitting: Cardiology

## 2016-05-21 ENCOUNTER — Other Ambulatory Visit: Payer: Self-pay | Admitting: General Practice

## 2016-05-21 MED ORDER — THYROID 60 MG PO TABS: 60.0000 mg | ORAL_TABLET | Freq: Every day | ORAL | 1 refills | Status: DC

## 2016-05-21 NOTE — Telephone Encounter (Signed)
Patient had DCCV 3/20. Patient states she converted to afib yesterday afternoon and is still in it now. She denies CP and SOB - she can simply feel she is out of rhythm.  Her Fitbit reads her HR in the 90s. She states she has not missed any Eliquis and understands to take as directed.  Confirmed with patient OV in the AFib Clinic Monday. She will call if symptoms occur or if HR maintains over 110 bpm. She was grateful for call and agrees with treatment plan.  Dr. Radford Pax made aware and agrees with above plan.

## 2016-05-21 NOTE — Telephone Encounter (Signed)
Patient calling, states that she had a recent A FIB episode. It started yesterday afternoon. Please call to discuss, thanks.

## 2016-05-25 ENCOUNTER — Encounter (HOSPITAL_COMMUNITY): Payer: Self-pay | Admitting: Nurse Practitioner

## 2016-05-25 ENCOUNTER — Ambulatory Visit (HOSPITAL_COMMUNITY)
Admission: RE | Admit: 2016-05-25 | Discharge: 2016-05-25 | Disposition: A | Discharge status: Home / Self Care | Payer: Medicare Other | Source: Ambulatory Visit | Attending: Nurse Practitioner | Admitting: Nurse Practitioner

## 2016-05-25 VITALS — BP 118/72 | HR 72 | Ht 59.0 in | Wt 141.4 lb

## 2016-05-25 DIAGNOSIS — E042 Nontoxic multinodular goiter: Secondary | ICD-10-CM | POA: Insufficient documentation

## 2016-05-25 DIAGNOSIS — E785 Hyperlipidemia, unspecified: Secondary | ICD-10-CM | POA: Insufficient documentation

## 2016-05-25 DIAGNOSIS — I130 Hypertensive heart and chronic kidney disease with heart failure and stage 1 through stage 4 chronic kidney disease, or unspecified chronic kidney disease: Secondary | ICD-10-CM | POA: Diagnosis not present

## 2016-05-25 DIAGNOSIS — I5032 Chronic diastolic (congestive) heart failure: Secondary | ICD-10-CM | POA: Insufficient documentation

## 2016-05-25 DIAGNOSIS — Z7901 Long term (current) use of anticoagulants: Secondary | ICD-10-CM | POA: Insufficient documentation

## 2016-05-25 DIAGNOSIS — N183 Chronic kidney disease, stage 3 (moderate): Secondary | ICD-10-CM | POA: Insufficient documentation

## 2016-05-25 DIAGNOSIS — I272 Pulmonary hypertension, unspecified: Secondary | ICD-10-CM | POA: Diagnosis not present

## 2016-05-25 DIAGNOSIS — I481 Persistent atrial fibrillation: Secondary | ICD-10-CM | POA: Insufficient documentation

## 2016-05-25 DIAGNOSIS — K219 Gastro-esophageal reflux disease without esophagitis: Secondary | ICD-10-CM | POA: Insufficient documentation

## 2016-05-25 DIAGNOSIS — M8580 Other specified disorders of bone density and structure, unspecified site: Secondary | ICD-10-CM | POA: Diagnosis not present

## 2016-05-25 DIAGNOSIS — H409 Unspecified glaucoma: Secondary | ICD-10-CM | POA: Insufficient documentation

## 2016-05-25 DIAGNOSIS — K648 Other hemorrhoids: Secondary | ICD-10-CM | POA: Diagnosis not present

## 2016-05-25 DIAGNOSIS — K449 Diaphragmatic hernia without obstruction or gangrene: Secondary | ICD-10-CM | POA: Diagnosis not present

## 2016-05-25 DIAGNOSIS — Z85118 Personal history of other malignant neoplasm of bronchus and lung: Secondary | ICD-10-CM | POA: Insufficient documentation

## 2016-05-25 DIAGNOSIS — Z87891 Personal history of nicotine dependence: Secondary | ICD-10-CM | POA: Diagnosis not present

## 2016-05-25 DIAGNOSIS — I351 Nonrheumatic aortic (valve) insufficiency: Secondary | ICD-10-CM | POA: Insufficient documentation

## 2016-05-25 DIAGNOSIS — Z8673 Personal history of transient ischemic attack (TIA), and cerebral infarction without residual deficits: Secondary | ICD-10-CM | POA: Diagnosis not present

## 2016-05-25 DIAGNOSIS — I4819 Other persistent atrial fibrillation: Secondary | ICD-10-CM

## 2016-05-25 DIAGNOSIS — Z8249 Family history of ischemic heart disease and other diseases of the circulatory system: Secondary | ICD-10-CM | POA: Insufficient documentation

## 2016-05-25 LAB — BASIC METABOLIC PANEL
Anion gap: 7 (ref 5–15)
BUN: 14 mg/dL (ref 6–20)
CO2: 28 mmol/L (ref 22–32)
Calcium: 8.7 mg/dL — ABNORMAL LOW (ref 8.9–10.3)
Chloride: 105 mmol/L (ref 101–111)
Creatinine, Ser: 0.85 mg/dL (ref 0.44–1.00)
GFR calc Af Amer: >60 mL/min (ref >60)
GLUCOSE: 101 mg/dL — AB (ref 65–99)
POTASSIUM: 3.7 mmol/L (ref 3.5–5.1)
Sodium: 140 mmol/L (ref 135–145)

## 2016-05-25 NOTE — Progress Notes (Signed)
Primary Care Physician: Annye Asa, MD Cardiologist: Dr. Radford Pax Referring MD: Dr. Gordy Clement Brecht is a 76 y.o. female with a h/o persisitent afib that with last f/u in the afib clinic 02/16/16 for f/u in the afib clinic. Since stopping amiodarone for prolonged QT, she had not had any further afib. She felt well.  She had a rt heart cath with Dr. Aundra Dubin on Monday,2/26, after echo showed pulmonary hypertension, and found to be in afib with RVR. She was asymptomatic. He started diltazem 180 mg which she has now taken twice. She missed one day of eliquis and restarted pm of cath. Dr. Aundra Dubin mentioned that [place] may be needed.  In the afib clinic,she is in afib at 110 bpm, again she cannot tell she is in afib. She feels well.  F/u in the afib clinic, 3/8, she is in rapid afib but after several minutes of sitting, HR decreased to 85-100. She still cannot tell she is in afib. Feels well. Will plan for cardioversion when she has been back on anticoagulation x 3 weeks which should be satisfied 3/19.  F/u cardioversion which was done 3/20. She felt that one night a few days ago she felt that she may be in afib but EKG today shows SR. She feels good today.  Today, she denies symptoms of palpitations, chest pain, shortness of breath, orthopnea, PND, lower extremity edema, dizziness, presyncope, syncope, or neurologic sequela. The patient is tolerating medications without difficulties and is otherwise without complaint today.   Past Medical History:  Diagnosis Date  . Allergic rhinitis, cause unspecified   . Aortic regurgitation 06/17/2015   Mild to moderate AR by echo 04/2015  . 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 Chronic cough   . Chronic diastolic heart failure ([place]) 06/17/2015  . CKD (chronic kidney disease), stage III   . Cough variant asthma   . Disease of pharynx or nasopharynx   . Diverticulosis   . Essential  hypertension   . GERD (gastroesophageal reflux disease)   . Glaucoma   . Hiatal hernia   . Hyperlipidemia   . Internal hemorrhoids   . Laryngospasm   . Multinodular goiter   . Osteopenia   . Persistent atrial fibrillation ([place])    a. Dx 04/2015 at time of stroke. b. DCCV 06/2015 - did not hold. Amio started then stopped due to QT prolongation.  Marland Kitchen Postmenopausal   . Radiation 10/22/14-11/23/14   Left Breast  . Stricture and stenosis of cervix    Past Surgical History:  Procedure Laterality Date  . BREAST SURGERY    . CARDIOVERSION N/A 06/24/2015   Procedure: CARDIOVERSION;  Surgeon: Sueanne Margarita, MD;  Location: [place];  Service: Cardiovascular;  Laterality: N/A;  . CARDIOVERSION N/A 05/19/2016   Procedure: CARDIOVERSION;  Surgeon: Sueanne Margarita, MD;  Location: [facility];  Service: Cardiovascular;  Laterality: N/A;  . COLONOSCOPY    . LUNG CANCER SURGERY  2003   resection carcinoid lingula-lt upper lobe  . RADIOACTIVE SEED GUIDED MASTECTOMY WITH AXILLARY SENTINEL LYMPH NODE BIOPSY Left 06/26/2014   Procedure: RADIOACTIVE SEED GUIDED PARTIAL MASTECTOMY WITH AXILLARY SENTINEL LYMPH NODE BIOPSY;  Surgeon: Stark Klein, MD;  Location: [place];  Service: General;  Laterality: Left;  . RE-EXCISION OF BREAST LUMPECTOMY Left 08/07/2014   Procedure: RE-EXCISION OF LEFT BREAST LUMPECTOMY;  Surgeon: Stark Klein, MD;  Location: [place]  [facility];  Service: General;  Laterality: Left;  . RIGHT HEART CATH N/A 04/27/2016   Procedure: Right Heart Cath;  Surgeon: Larey Dresser, MD;  Location: [place] [facility];  Service: Cardiovascular;  Laterality: N/A;    Current Outpatient Prescriptions  Medication Sig Dispense Refill  . ADVAIR HFA 230-21 MCG/ACT inhaler USE 2 INHALATIONS TWICE A DAY 36 g 1  . albuterol (PROVENTIL HFA;VENTOLIN HFA) 108 (90 Base) MCG/ACT inhaler Inhale 2 puffs into the lungs every 6 (six) hours as needed for wheezing or shortness of breath.    .  anastrozole (ARIMIDEX) 1 MG tablet Take 1 tablet (1 mg total) by mouth daily. 90 tablet 3  . apixaban (ELIQUIS) 5 MG TABS tablet Take 1 tablet (5 mg total) by mouth 2 (two) times daily. (Patient taking differently: Take 5 mg by mouth 2 (two) times daily. Will stop prior to procedure) 180 tablet 3  . atorvastatin (LIPITOR) 40 MG tablet Take 1 tablet (40 mg total) by mouth daily. (Patient taking differently: Take 40 mg by mouth daily at 6 PM. ) 90 tablet 3  . bimatoprost (LUMIGAN) 0.03 % ophthalmic drops Place 1 drop into both eyes at bedtime.     Marland Kitchen diltiazem (CARDIZEM CD) 180 MG 24 hr capsule Take 180 mg by mouth daily.    . dorzolamide (TRUSOPT) 2 % ophthalmic solution Place 1 drop into both eyes 2 (two) times daily.    . furosemide (LASIX) 20 MG tablet Take 1 tablet (20 mg total) by mouth as directed. Take 2 tabs in the morning and 1 tab in the pm (Patient taking differently: Take 20-40 mg by mouth See admin instructions. Take 2 tabs in the morning and 1 tab in the pm) 270 tablet 3  . gabapentin (NEURONTIN) 400 MG capsule Take 1 capsule (400 mg total) by mouth 2 (two) times daily. 180 capsule 1  . loratadine (CLARITIN) 10 MG tablet Take 10 mg by mouth daily as needed for allergies. Reported on 09/16/2015    . Multiple Vitamins-Iron (MULTIVITAMIN/IRON) TABS Take 1 tablet by mouth daily.     Marland Kitchen omeprazole (PRILOSEC) 20 MG capsule TAKE 1 CAPSULE DAILY 30 MINUTES BEFORE A MEAL 90 capsule 1  . potassium chloride SA (K-DUR,KLOR-CON) 20 MEQ tablet Take 1 tablet (20 mEq total) by mouth 2 (two) times daily. 180 tablet 3  . thyroid (ARMOUR THYROID) 60 MG tablet Take 1 tablet (60 mg total) by mouth daily before breakfast. 90 tablet 1  . traMADol (ULTRAM) 50 MG tablet Take 1 tablet (50 mg total) by mouth every 6 (six) hours as needed for moderate pain. 90 tablet 0   No current facility-administered medications for this encounter.     Allergies  Allergen Reactions  . Combigan [Brimonidine Tartrate-Timolol]  Itching    Eyes itch, reddened  . Other Other (See Comments)    Per patient made OU red, Sore, and sensitivity to light  . Sulfamethoxazole-Trimethoprim Hives  . Sulfa Antibiotics Rash    Social History   Social History  . Marital status: Married    Spouse name: N/A  . Number of children: N/A  . Years of education: N/A   Occupational History  . Not on file.   Social History Main Topics  . Smoking status: Former Smoker    Packs/day: 0.75    Years: 5.00    Quit date: 06/14/1961  . Smokeless tobacco: Never Used  . Alcohol use 0.0 oz/week     Comment: occasional wine  .  Drug use: No  . Sexual activity: Not on file   Other Topics Concern  . Not on file   Social History Narrative   Retired. Lives with husband.     Family History  Problem Relation Age of Onset  . Stroke Mother   . Hypertension Mother   . Hyperlipidemia Mother   . Stroke Father   . Transient ischemic attack Father   . Hyperlipidemia Father   . Hypertension Father   . Atrial fibrillation Son   . Heart attack Neg Hx     ROS- All systems are reviewed and negative except as per the HPI above  Physical Exam: Vitals:   05/25/16 1359  BP: 118/72  Pulse: 72  Weight: 141 lb 6.4 oz (64.1 kg)  Height: '4\' 11"'$  (1.499 m)   Wt Readings from Last 3 Encounters:  05/25/16 141 lb 6.4 oz (64.1 kg)  05/14/16 138 lb 6.4 oz (62.8 kg)  05/07/16 140 lb (63.5 kg)    Labs: Lab Results  Component Value Date   NA 140 05/25/2016   K 3.7 05/25/2016   CL 105 05/25/2016   CO2 28 05/25/2016   GLUCOSE 101 (H) 05/25/2016   BUN 14 05/25/2016   CREATININE 0.85 05/25/2016   CALCIUM 8.7 (L) 05/25/2016   MG 2.1 09/09/2015   Lab Results  Component Value Date   INR 1.0 04/24/2016   Lab Results  Component Value Date   CHOL 180 04/01/2016   HDL 67.20 04/01/2016   LDLCALC 94 04/01/2016   TRIG 96.0 04/01/2016     GEN- The patient is well appearing, alert and oriented x 3 today.   Head- normocephalic,  atraumatic Eyes-  Sclera clear, conjunctiva pink Ears- hearing intact Oropharynx- clear Neck- supple, no JVP Lymph- no cervical lymphadenopathy Lungs- Clear to ausculation bilaterally, normal work of breathing Heart- Regular rate and rhythm, no murmurs, rubs or gallops, PMI not laterally displaced GI- soft, NT, ND, + BS Extremities- no clubbing, cyanosis, or edema MS- no significant deformity or atrophy Skin- no rash or lesion Psych- euthymic mood, full affect Neuro- strength and sensation are intact  EKG-NSR ar 69 bpm, Pr int 148 ms, qrs itn 74 ms, qtc 409 ms Epic records reviewed Echo-04/09/16-Study Conclusions  - Left ventricle: The cavity size was normal. Wall thickness was   normal. Systolic function was normal. The estimated ejection   fraction was in the range of 55% to 60%. Wall motion was normal;   there were no regional wall motion abnormalities. Features are   consistent with a pseudonormal left ventricular filling pattern,   with concomitant abnormal relaxation and increased filling   pressure (grade 2 diastolic dysfunction). Doppler parameters are   consistent with high ventricular filling pressure. - Aortic valve: There was mild regurgitation. - Mitral valve: Calcified annulus. There was mild regurgitation. - Left atrium: The atrium was severely dilated.48 mm - Tricuspid valve: There was moderate regurgitation. - Pulmonary arteries: Systolic pressure was moderately increased.   PA peak pressure: 52 mm Hg (S).  Impressions:  - Normal LV systolic function; grade 2 diastolic dysfunction;   elevated LV filling pressure; mild AI; mild MR; severe LAE;   moderate TR with moderately elevated pulmonary pressure.  Rt heart cath-Conclusion   1. Mildly elevated right and left heart filling pressures.  2. Borderline pulmonary hypertension.   Overall, hemodynamics look ok. I do not think she has significant pulmonary hypertension.  However, she is in atrial fibrillation  with RVR today.  She is  not on rate control meds at home.  I will start diltiazem CD 180 mg daily, giving dose now.  She will restart Eliquis this evening.  I will have her followup in atrial fibrillation clinic.  If she does not revert to NSR, would consider DCCV.  She is, of note, not particularly symptomatic (did not know that she was back in atrial fibrillation).         Assessment and Plan: 1. Paroxsymal afib Off amiodarone due to QTC prolongation, concern is that would happen with other antiarrythmic's, sotalol/tikosyn Not a good ablation candidate due to left atrial size Held SR for some time off amiodarone, but now has returned to afib In SR today following cardioversion Continue cardizem 180 mg a day  Continue eliquis 5 mg bid Bmet today shows K+ at 3.7, continue K+ supplements at 20 meq bid   F/u per scheduled with Dr, Radford Pax afib clinic as needed   Geroge Baseman. Kashlyn Salinas, [facility] [address] [place], [place] [address] [phone]

## 2016-05-29 ENCOUNTER — Other Ambulatory Visit: Payer: Self-pay | Admitting: Physician Assistant

## 2016-06-04 ENCOUNTER — Other Ambulatory Visit: Payer: Self-pay | Admitting: *Deleted

## 2016-06-04 MED ORDER — DILTIAZEM HCL ER COATED BEADS 180 MG PO CP24: 180.0000 mg | ORAL_CAPSULE | Freq: Every day | ORAL | 0 refills | Status: DC

## 2016-06-06 ENCOUNTER — Other Ambulatory Visit: Payer: Self-pay | Admitting: Family Medicine

## 2016-06-06 DIAGNOSIS — I4819 Other persistent atrial fibrillation: Secondary | ICD-10-CM

## 2016-06-06 DIAGNOSIS — I351 Nonrheumatic aortic (valve) insufficiency: Secondary | ICD-10-CM

## 2016-06-06 DIAGNOSIS — I5032 Chronic diastolic (congestive) heart failure: Secondary | ICD-10-CM

## 2016-06-06 DIAGNOSIS — I10 Essential (primary) hypertension: Secondary | ICD-10-CM

## 2016-06-07 ENCOUNTER — Other Ambulatory Visit: Payer: Self-pay | Admitting: Physician Assistant

## 2016-06-08 ENCOUNTER — Other Ambulatory Visit: Payer: Self-pay | Admitting: General Practice

## 2016-06-08 MED ORDER — TRAMADOL HCL 50 MG PO TABS: 50.0000 mg | ORAL_TABLET | Freq: Four times a day (QID) | ORAL | 0 refills | Status: DC | PRN

## 2016-06-08 NOTE — Telephone Encounter (Signed)
Medication filled to pharmacy as requested.   

## 2016-06-08 NOTE — Telephone Encounter (Signed)
Last OV 04/01/16 Tramadol last filled 05/14/16 #90 with 0

## 2016-06-15 ENCOUNTER — Other Ambulatory Visit: Payer: Self-pay

## 2016-06-15 MED ORDER — ANASTROZOLE 1 MG PO TABS: 1.0000 mg | ORAL_TABLET | Freq: Every day | ORAL | 3 refills | Status: DC

## 2016-07-07 ENCOUNTER — Other Ambulatory Visit: Payer: Self-pay | Admitting: Family Medicine

## 2016-07-21 DIAGNOSIS — R922 Inconclusive mammogram: Secondary | ICD-10-CM | POA: Diagnosis not present

## 2016-07-21 DIAGNOSIS — Z853 Personal history of malignant neoplasm of breast: Secondary | ICD-10-CM | POA: Diagnosis not present

## 2016-07-21 LAB — HM MAMMOGRAPHY

## 2016-07-28 ENCOUNTER — Encounter: Payer: Self-pay | Admitting: General Practice

## 2016-08-19 ENCOUNTER — Other Ambulatory Visit: Payer: Self-pay | Admitting: Physician Assistant

## 2016-08-19 ENCOUNTER — Other Ambulatory Visit: Payer: Self-pay | Admitting: Family Medicine

## 2016-08-19 DIAGNOSIS — I5033 Acute on chronic diastolic (congestive) heart failure: Secondary | ICD-10-CM

## 2016-08-20 ENCOUNTER — Other Ambulatory Visit: Payer: Self-pay | Admitting: General Practice

## 2016-08-20 MED ORDER — TRAMADOL HCL 50 MG PO TABS: 50.0000 mg | ORAL_TABLET | Freq: Four times a day (QID) | ORAL | 0 refills | Status: DC | PRN

## 2016-08-20 NOTE — Telephone Encounter (Signed)
Medication filled to pharmacy as requested.   

## 2016-08-20 NOTE — Telephone Encounter (Signed)
Last OV 04/01/16 Tramadol last filled 06/08/16 #90 with 0

## 2016-08-26 ENCOUNTER — Other Ambulatory Visit: Payer: Self-pay | Admitting: General Practice

## 2016-08-26 MED ORDER — THYROID 60 MG PO TABS: 60.0000 mg | ORAL_TABLET | Freq: Every day | ORAL | 1 refills | Status: DC

## 2016-08-31 ENCOUNTER — Telehealth: Payer: Self-pay | Admitting: Cardiology

## 2016-08-31 DIAGNOSIS — D692 Other nonthrombocytopenic purpura: Secondary | ICD-10-CM | POA: Diagnosis not present

## 2016-08-31 NOTE — Telephone Encounter (Signed)
Patient states she went on a [place] cruise and traveled home yesterday. Before she got on the plane, she noticed red blotchiness on both of her legs. It did not itch, but she said she thought she walked through some plants and maybe got a rash.  When she got off the plane, both her legs were very swollen. She was even scared to take her shoes off in case she couldn't get them back on.  She has remained sitting with her legs elevated all day and the swelling has improved (it has not resolved but it is improved).  She reports her legs are the same size, are not painful, and are not hot to the touch.  She reports no other symptoms and denies SOB and CP. Since the swelling is improving and the redness was present on both extremities prior to the flight, the patient was instructed to go to PCP for evaluation.  She understands to call if PCP requires further Cardiology follow-up.

## 2016-08-31 NOTE — Telephone Encounter (Signed)
New message    Pt c/o swelling: STAT is pt has developed SOB within 24 hours  1. How long have you been experiencing swelling? Both legs have a red rash on them and swollen  2. Where is the swelling located? legs  3.  Are you currently taking a "fluid pill"? Yes takes 2 furosemide pilles  4.  Are you currently SOB? no  5.  Have you traveled recently? Happened after [place]

## 2016-09-02 ENCOUNTER — Other Ambulatory Visit: Payer: Self-pay | Admitting: Cardiology

## 2016-09-08 ENCOUNTER — Ambulatory Visit (INDEPENDENT_AMBULATORY_CARE_PROVIDER_SITE_OTHER): Payer: Medicare Other | Admitting: Family Medicine

## 2016-09-08 ENCOUNTER — Encounter: Payer: Self-pay | Admitting: Family Medicine

## 2016-09-08 VITALS — BP 138/82 | HR 77 | Ht 59.0 in | Wt 134.7 lb

## 2016-09-08 DIAGNOSIS — I482 Chronic atrial fibrillation, unspecified: Secondary | ICD-10-CM

## 2016-09-08 DIAGNOSIS — C3492 Malignant neoplasm of unspecified part of left bronchus or lung: Secondary | ICD-10-CM

## 2016-09-08 DIAGNOSIS — I638 Other cerebral infarction: Secondary | ICD-10-CM

## 2016-09-08 DIAGNOSIS — C3410 Malignant neoplasm of upper lobe, unspecified bronchus or lung: Secondary | ICD-10-CM | POA: Insufficient documentation

## 2016-09-08 DIAGNOSIS — I5032 Chronic diastolic (congestive) heart failure: Secondary | ICD-10-CM

## 2016-09-08 DIAGNOSIS — C50412 Malignant neoplasm of upper-outer quadrant of left female breast: Secondary | ICD-10-CM

## 2016-09-08 DIAGNOSIS — I6389 Other cerebral infarction: Secondary | ICD-10-CM

## 2016-09-08 NOTE — Progress Notes (Signed)
New patient office visit note:  Impression and Recommendations:    1. Chronic atrial fibrillation ([place])   2. Cerebrovascular accident (CVA) due to other mechanism ([place])   3. Chronic diastolic heart failure ([place])   4. Malignant neoplasm of upper-outer quadrant of left female breast, unspecified estrogen receptor status ([place])   5. Malignant neoplasm of left lung, unspecified part of lung (HCC)      Chronic diastolic heart failure (HCC)  Around afib- 10 yrs ago.    The patient was counseled, risk factors were discussed, anticipatory guidance given.   This SmartLink is deprecated. Use AVSMEDLIST instead to display the medication list for a patient.   This SmartLink is deprecated. Use AVSMEDLIST instead to display the medication list for a patient.    No orders of the defined types were placed in this encounter.    Gross side effects, risk and benefits, and alternatives of medications discussed with patient.  Patient is aware that all medications have potential side effects and we are unable to predict every side effect or drug-drug interaction that may occur.  Expresses verbal understanding and consents to current therapy plan and treatment regimen.  Return for 4 months follow-up chronic medical issues.  Please see AVS handed out to patient at the end of our visit for further patient instructions/ counseling done pertaining to today's office visit.    Note: This document was prepared using Dragon voice recognition software and may include unintentional dictation errors.  ----------------------------------------------------------------------------------------------------------------------    Subjective:    Chief complaint:   Chief Complaint  Patient presents with  . Establish Care     HPI: Patricia [name] is a pleasant 76 y.o. female who presents to [facility] today to review their medical history with me and establish care.   I asked  the patient to review their chronic problem list with me to ensure everything was updated and accurate.    All recent office visits with other providers, any medical records that patient brought in etc  - I reviewed today.     Also asked pt to get me medical records from [facility] providers/ specialists that they had seen within the past 3-5 years- if they are in private practice and/or do not work for a [employer], [facility], [place], [place] or [employer] owned practice.  Told them to call their specialists to clarify this if they are not sure.   Changing from Dr Birdie Riddle in [place] due to location- 42yrs been with her.   Problem  Chronic Diastolic Heart Failure (Hcc)   EF 60% by echo 04/2015       Wt Readings from Last 3 Encounters:  01/06/17 129 lb 14.4 oz (58.9 kg)  12/02/16 133 lb (60.3 kg)  11/19/16 131 lb 9.6 oz (59.7 kg)   BP Readings from Last 3 Encounters:  01/06/17 119/76  12/02/16 116/64  11/19/16 139/76   Pulse Readings from Last 3 Encounters:  01/06/17 77  12/02/16 86  11/19/16 86   BMI Readings from Last 3 Encounters:  01/06/17 26.24 kg/m  12/02/16 26.86 kg/m  11/19/16 26.58 kg/m    Patient Care Team    Relationship Specialty Notifications Start End  Mellody Dance, DO PCP - General Family Medicine  09/08/16   Thea Silversmith, MD (Inactive) Consulting Physician Radiation Oncology  11/29/14   Magrinat, Virgie Dad, MD Consulting Physician Oncology  11/29/14   Stark Klein, MD Consulting Physician General Surgery  11/29/14   Jake Shark,  Johny Blamer, NP Nurse Practitioner Nurse Practitioner  11/29/14    Comment: Survivorship  Ladene Artist, MD Consulting Physician Gastroenterology  02/14/15   Sueanne Margarita, MD Consulting Physician Cardiology  09/08/16     Patient Active Problem List   Diagnosis Date Noted  . Persistent atrial fibrillation (HCC)     Priority: High  . Chronic diastolic heart failure ([place]) 06/17/2015    Priority: High  . CVA (cerebral vascular  accident) ([place]) 04/30/2015    Priority: High  . Lung cancer, lingula ([place]) 09/08/2016    Priority: Medium  . Malignant neoplasm of upper-outer quadrant of left breast in female, estrogen receptor positive ([place]) 06/08/2013    Priority: Medium  . CKD (chronic kidney disease), stage III ([place]) 10/30/2016  . History of CVA (cerebrovascular accident) 05/14/2016  . Abnormal PFT 10/24/2015  . Aortic regurgitation 06/17/2015  . Essential hypertension 06/17/2015  . CVA (cerebral infarction)   . Pulmonary edema 04/27/2015  . 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 Multinodular goiter 01/11/2013  . 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 General medical examination 06/09/2010  . Screening for malignant neoplasm of the cervix 06/09/2010  . ESOPHAGEAL STRICTURE 09/26/2009  . GERD 09/26/2009  . HEARING LOSS, UNSPEC. 08/15/2009  . Hyperlipidemia 06/10/2009  . Diverticulosis of large intestine 06/10/2009  . GLAUCOMA, BILATERAL 02/07/2009  . COUGH VARIANT ASTHMA 06/27/2007  . Osteopenia 05/28/2007  . STRICTURE AND STENOSIS OF CERVIX 05/06/2007  . POSTMENOPAUSAL STATUS 05/06/2007  . Chronic cough 01/21/2007  . LUNG CANCER, HX OF 07/29/2006     Past Medical History:  Diagnosis Date  . Allergic rhinitis, cause unspecified   . Aortic regurgitation 06/17/2015   Mild to moderate AR by echo 04/2015  . 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 Chronic cough   . Chronic diastolic heart failure ([place]) 06/17/2015  . CKD (chronic kidney disease), stage III ([place])   . Cough variant asthma   . Disease of pharynx or nasopharynx   . Diverticulosis   . Essential hypertension   . GERD (gastroesophageal reflux disease)   . Glaucoma   . Hiatal hernia   . Hyperlipidemia   . Internal hemorrhoids   . Laryngospasm   . Multinodular goiter   . Osteopenia   . Persistent atrial fibrillation ([place])    a. Dx 04/2015 at time of stroke. b. DCCV 06/2015 - did not hold. Amio  started then stopped due to QT prolongation.  Marland Kitchen Postmenopausal   . Radiation 10/22/14-11/23/14   Left Breast  . Stricture and stenosis of cervix      Past Medical History:  Diagnosis Date  . Allergic rhinitis, cause unspecified   . Aortic regurgitation 06/17/2015   Mild to moderate AR by echo 04/2015  . 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 Chronic cough   . Chronic diastolic heart failure ([place]) 06/17/2015  . CKD (chronic kidney disease), stage III ([place])   . Cough variant asthma   . Disease of pharynx or nasopharynx   . Diverticulosis   . Essential hypertension   . GERD (gastroesophageal reflux disease)   . Glaucoma   . Hiatal hernia   . Hyperlipidemia   . Internal hemorrhoids   . Laryngospasm   . Multinodular goiter   . Osteopenia   . Persistent atrial fibrillation ([place])  a. Dx 04/2015 at time of stroke. b. DCCV 06/2015 - did not hold. Amio started then stopped due to QT prolongation.  Marland Kitchen Postmenopausal   . Radiation 10/22/14-11/23/14   Left Breast  . Stricture and stenosis of cervix      Past Surgical History:  Procedure Laterality Date  . BREAST SURGERY    . COLONOSCOPY    . LUNG CANCER SURGERY  2003   resection carcinoid lingula-lt upper lobe     Family History  Problem Relation Age of Onset  . Stroke Mother   . Hypertension Mother   . Hyperlipidemia Mother   . Stroke Father   . Transient ischemic attack Father   . Hyperlipidemia Father   . Hypertension Father   . Atrial fibrillation Son   . Heart attack Neg Hx      Social History   Substance and Sexual Activity  Drug Use No     Social History   Substance and Sexual Activity  Alcohol Use Yes  . Alcohol/week: 0.0 oz   Comment: occasional wine     Social History   Tobacco Use  Smoking Status Former Smoker  . Packs/day: 0.75  . Years: 5.00  . Pack years: 3.75  . Last attempt to quit: 06/14/1961  . Years since quitting: 55.6  Smokeless  Tobacco Never Used     Outpatient Encounter Medications as of 09/08/2016  Medication Sig  . albuterol (PROVENTIL HFA;VENTOLIN HFA) 108 (90 Base) MCG/ACT inhaler Inhale 2 puffs into the lungs every 6 (six) hours as needed for wheezing or shortness of breath.  . anastrozole (ARIMIDEX) 1 MG tablet Take 1 tablet (1 mg total) by mouth daily.  . digoxin (LANOXIN) 0.125 MG tablet TAKE 1 TABLET DAILY  . diltiazem (CARDIZEM CD) 180 MG 24 hr capsule TAKE 1 CAPSULE DAILY  . furosemide (LASIX) 20 MG tablet TAKE 2 TABLETS EVERY MORNING AND 1 TABLET EVERY EVENING  . loratadine (CLARITIN) 10 MG tablet Take 10 mg by mouth daily as needed for allergies. Reported on 09/16/2015  . Multiple Vitamins-Iron (MULTIVITAMIN/IRON) TABS Take 1 tablet by mouth daily.   . potassium chloride SA (K-DUR,KLOR-CON) 20 MEQ tablet Take 1 tablet (20 mEq total) by mouth 2 (two) times daily.  . [DISCONTINUED] ADVAIR HFA 230-21 MCG/ACT inhaler USE 2 INHALATIONS TWICE A DAY  . [DISCONTINUED] atorvastatin (LIPITOR) 40 MG tablet Take 1 tablet (40 mg total) by mouth daily. (Patient taking differently: Take 40 mg by mouth daily at 6 PM. )  . [DISCONTINUED] ELIQUIS 5 MG TABS tablet TAKE 1 TABLET TWICE A DAY  . [DISCONTINUED] gabapentin (NEURONTIN) 400 MG capsule Take 1 capsule (400 mg total) by mouth 2 (two) times daily.  . [DISCONTINUED] omeprazole (PRILOSEC) 20 MG capsule TAKE 1 CAPSULE DAILY 30 MINUTES BEFORE A MEAL  . [DISCONTINUED] thyroid (ARMOUR THYROID) 60 MG tablet Take 1 tablet (60 mg total) by mouth daily before breakfast.  . [DISCONTINUED] bimatoprost (LUMIGAN) 0.03 % ophthalmic drops Place 1 drop into both eyes at bedtime.   . [DISCONTINUED] dorzolamide (TRUSOPT) 2 % ophthalmic solution Place 1 drop into both eyes 2 (two) times daily.  . [DISCONTINUED] metoprolol tartrate (LOPRESSOR) 25 MG tablet TAKE 1 TABLET TWICE A DAY  . [DISCONTINUED] traMADol (ULTRAM) 50 MG tablet Take 1 tablet (50 mg total) by mouth every 6 (six) hours as  needed for moderate pain.   No facility-administered encounter medications on file as of 09/08/2016.     Allergies: Combigan [brimonidine tartrate-timolol]; Other; Sulfa antibiotics; and Sulfamethoxazole-trimethoprim  Review of Systems  Constitutional: Negative for chills, diaphoresis, fever, malaise/fatigue and weight loss.  HENT: Negative for congestion, sore throat and tinnitus.   Eyes: Negative for blurred vision, double vision and photophobia.  Respiratory: Negative for cough and wheezing.   Cardiovascular: Negative for chest pain and palpitations.  Gastrointestinal: Negative for blood in stool, diarrhea, nausea and vomiting.  Genitourinary: Negative for dysuria, frequency and urgency.  Musculoskeletal: Negative for joint pain and myalgias.  Skin: Negative for itching and rash.  Neurological: Negative for dizziness, focal weakness, weakness and headaches.  Endo/Heme/Allergies: Negative for environmental allergies and polydipsia. Does not bruise/bleed easily.  Psychiatric/Behavioral: Negative for depression and memory loss. The patient is not nervous/anxious and does not have insomnia.      Objective:   Blood pressure 138/82, pulse 77, height 4\' 11"  (1.499 m), weight 134 lb 11.2 oz (61.1 kg). Body mass index is 27.21 kg/m. General: Well Developed, well nourished, and in no acute distress.  Neuro: Alert and oriented x3, extra-ocular muscles intact, sensation grossly intact.  HEENT:[place]/AT, PERRLA, neck supple, No carotid bruits Skin: no gross rashes  Cardiac: Regular rate and rhythm Respiratory: Essentially clear to auscultation bilaterally. Not using accessory muscles, speaking in full sentences.  Abdominal: not grossly distended Musculoskeletal: Ambulates w/o diff, FROM * 4 ext.  Vasc: less 2 sec cap RF, warm and pink  Psych:  No HI/SI, judgement and insight good, Euthymic mood. Full Affect.    Recent Results (from the past 2160 hour(s))  CBC With Differential      Status: None   Collection Time: 11/05/16  8:31 AM  Result Value Ref Range   WBC 6.6 3.4 - 10.8 x10E3/uL   RBC 4.63 3.77 - 5.28 x10E6/uL   Hemoglobin 14.8 11.1 - 15.9 g/dL   Hematocrit 42.7 34.0 - 46.6 %   MCV 92 79 - 97 fL   MCH 32.0 26.6 - 33.0 pg   MCHC 34.7 31.5 - 35.7 g/dL   RDW 13.9 12.3 - 15.4 %   Neutrophils 65 Not Estab. %   Lymphs 25 Not Estab. %   Monocytes 9 Not Estab. %   Eos 0 Not Estab. %   Basos 0 Not Estab. %   Neutrophils Absolute 4.4 1.4 - 7.0 x10E3/uL   Lymphocytes Absolute 1.6 0.7 - 3.1 x10E3/uL   Monocytes Absolute 0.6 0.1 - 0.9 x10E3/uL   EOS (ABSOLUTE) 0.0 0.0 - 0.4 x10E3/uL   Basophils Absolute 0.0 0.0 - 0.2 x10E3/uL   Immature Granulocytes 1 Not Estab. %   Immature Grans (Abs) 0.0 0.0 - 0.1 x10E3/uL  Comprehensive metabolic panel     Status: Abnormal   Collection Time: 11/05/16  8:31 AM  Result Value Ref Range   Glucose 116 (H) 65 - 99 mg/dL   BUN 18 8 - 27 mg/dL   Creatinine, Ser 0.98 0.57 - 1.00 mg/dL   GFR calc non Af Amer 56 (L) >59 mL/min/1.73   GFR calc Af Amer 65 >59 mL/min/1.73   BUN/Creatinine Ratio 18 12 - 28   Sodium 143 134 - 144 mmol/L   Potassium 4.2 3.5 - 5.2 mmol/L   Chloride 102 96 - 106 mmol/L   CO2 24 20 - 29 mmol/L   Calcium 9.2 8.7 - 10.3 mg/dL   Total Protein 6.6 6.0 - 8.5 g/dL   Albumin 4.2 3.5 - 4.8 g/dL   Globulin, Total 2.4 1.5 - 4.5 g/dL   Albumin/Globulin Ratio 1.8 1.2 - 2.2   Bilirubin Total 0.6 0.0 - 1.2  mg/dL   Alkaline Phosphatase 95 39 - 117 IU/L   AST 14 0 - 40 IU/L   ALT 21 0 - 32 IU/L  Hemoglobin A1c     Status: Abnormal   Collection Time: 11/05/16  8:31 AM  Result Value Ref Range   Hgb A1c MFr Bld 6.0 (H) 4.8 - 5.6 %    Comment:          Prediabetes: 5.7 - 6.4          Diabetes: >6.4          Glycemic control for adults with diabetes: <7.0    Est. average glucose Bld gHb Est-mCnc 126 mg/dL  Lipid panel     Status: None   Collection Time: 11/05/16  8:31 AM  Result Value Ref Range   Cholesterol, Total  136 100 - 199 mg/dL   Triglycerides 116 0 - 149 mg/dL   HDL 43 >39 mg/dL   VLDL Cholesterol Cal 23 5 - 40 mg/dL   LDL Calculated 70 0 - 99 mg/dL   Chol/HDL Ratio 3.2 0.0 - 4.4 ratio    Comment:                                   T. Chol/HDL Ratio                                             Men  Women                               1/2 Avg.Risk  3.4    3.3                                   Avg.Risk  5.0    4.4                                2X Avg.Risk  9.6    7.1                                3X Avg.Risk 23.4   11.0   VITAMIN D 25 Hydroxy (Vit-D Deficiency, Fractures)     Status: Abnormal   Collection Time: 11/05/16  8:31 AM  Result Value Ref Range   Vit D, 25-Hydroxy 26.7 (L) 30.0 - 100.0 ng/mL    Comment: Vitamin D deficiency has been defined by the [place] practice guideline as a level of serum 25-OH vitamin D less than 20 ng/mL (1,2). The Endocrine Society went on to further define vitamin D insufficiency as a level between 21 and 29 ng/mL (2). 1. IOM (Institute of Medicine). 2010. Dietary reference    intakes for calcium and D. [place]: The    [employer]. 2. Holick MF, Binkley [place], Bischoff-Ferrari HA, et al.    Evaluation, treatment, and prevention of vitamin D    deficiency: an Endocrine Society clinical practice    guideline. JCEM. 2011 Jul; 96(7):1911-30.   TSH     Status: None   Collection Time: 11/05/16  8:31 AM  Result Value Ref Range   TSH 1.540 0.450 - 4.500 uIU/mL  Vitamin B12     Status: None   Collection Time: 11/05/16  8:31 AM  Result Value Ref Range   Vitamin B-12 441 232 - 1,245 pg/mL  T4, free     Status: None   Collection Time: 11/05/16  8:31 AM  Result Value Ref Range   Free T4 0.95 0.82 - 1.77 ng/dL

## 2016-09-08 NOTE — Patient Instructions (Signed)
 Please realize, EXERCISE IS MEDICINE!  -  American Heart Association ( AHA) guidelines for exercise : If you are in good health, without any medical conditions, you should engage in 150 minutes of moderate intensity aerobic activity per week.  This means you should be huffing and puffing throughout your workout.   Engaging in regular exercise will improve brain function and memory, as well as improve mood, boost immune system and help with weight management.  As well as the other, more well-known effects of exercise such as decreasing blood sugar levels, decreasing blood pressure,  and decreasing bad cholesterol levels/ increasing good cholesterol levels.     -  The AHA strongly endorses consumption of a diet that contains a variety of foods from all the food categories with an emphasis on fruits and vegetables; fat-free and low-fat dairy products; cereal and grain products; legumes and nuts; and fish, poultry, and/or extra lean meats.    Excessive food intake, especially of foods high in saturated and trans fats, sugar, and salt, should be avoided.    Adequate water intake of roughly 1/2 of your weight in pounds, should equal the ounces of water per day you should drink.  So for instance, if you're 200 pounds, that would be 100 ounces of water per day.         Mediterranean Diet  Why follow it? Research shows. . Those who follow the Mediterranean diet have a reduced risk of heart disease  . The diet is associated with a reduced incidence of Parkinson's and Alzheimer's diseases . People following the diet may have longer life expectancies and lower rates of chronic diseases  . The Dietary Guidelines for Americans recommends the Mediterranean diet as an eating plan to promote health and prevent disease  What Is the Mediterranean Diet?  . Healthy eating plan based on typical foods and recipes of Mediterranean-style cooking . The diet is primarily a plant based diet; these foods should make up a  majority of meals   Starches - Plant based foods should make up a majority of meals - They are an important sources of vitamins, minerals, energy, antioxidants, and fiber - Choose whole grains, foods high in fiber and minimally processed items  - Typical grain sources include wheat, oats, barley, corn, brown rice, bulgar, farro, millet, polenta, couscous  - Various types of beans include chickpeas, lentils, fava beans, black beans, white beans   Fruits  Veggies - Large quantities of antioxidant rich fruits & veggies; 6 or more servings  - Vegetables can be eaten raw or lightly drizzled with oil and cooked  - Vegetables common to the traditional Mediterranean Diet include: artichokes, arugula, beets, broccoli, brussel sprouts, cabbage, carrots, celery, collard greens, cucumbers, eggplant, kale, leeks, lemons, lettuce, mushrooms, okra, onions, peas, peppers, potatoes, pumpkin, radishes, rutabaga, shallots, spinach, sweet potatoes, turnips, zucchini - Fruits common to the Mediterranean Diet include: apples, apricots, avocados, cherries, clementines, dates, figs, grapefruits, grapes, melons, nectarines, oranges, peaches, pears, pomegranates, strawberries, tangerines  Fats - Replace butter and margarine with healthy oils, such as olive oil, canola oil, and tahini  - Limit nuts to no more than a handful a day  - Nuts include walnuts, almonds, pecans, pistachios, pine nuts  - Limit or avoid candied, honey roasted or heavily salted nuts - Olives are central to the Mediterranean diet - can be eaten whole or used in a variety of dishes   Meats Protein - Limiting red meat: no more than a few times a   month - When eating red meat: choose lean cuts and keep the portion to the size of deck of cards - Eggs: approx. 0 to 4 times a week  - Fish and lean poultry: at least 2 a week  - Healthy protein sources include, chicken, turkey, lean beef, lamb - Increase intake of seafood such as tuna, salmon, trout,  mackerel, shrimp, scallops - Avoid or limit high fat processed meats such as sausage and bacon  Dairy - Include moderate amounts of low fat dairy products  - Focus on healthy dairy such as fat free yogurt, skim milk, low or reduced fat cheese - Limit dairy products higher in fat such as whole or 2% milk, cheese, ice cream  Alcohol - Moderate amounts of red wine is ok  - No more than 5 oz daily for women (all ages) and men older than age 65  - No more than 10 oz of wine daily for men younger than 65  Other - Limit sweets and other desserts  - Use herbs and spices instead of salt to flavor foods  - Herbs and spices common to the traditional Mediterranean Diet include: basil, bay leaves, chives, cloves, cumin, fennel, garlic, lavender, marjoram, mint, oregano, parsley, pepper, rosemary, sage, savory, sumac, tarragon, thyme   It's not just a diet, it's a lifestyle:  . The Mediterranean diet includes lifestyle factors typical of those in the region  . Foods, drinks and meals are best eaten with others and savored . Daily physical activity is important for overall good health . This could be strenuous exercise like running and aerobics . This could also be more leisurely activities such as walking, housework, yard-work, or taking the stairs . Moderation is the key; a balanced and healthy diet accommodates most foods and drinks . Consider portion sizes and frequency of consumption of certain foods   Meal Ideas & Options:  . Breakfast:  o Whole wheat toast or whole wheat English muffins with peanut butter & hard boiled egg o Steel cut oats topped with apples & cinnamon and skim milk  o Fresh fruit: banana, strawberries, melon, berries, peaches  o Smoothies: strawberries, bananas, greek yogurt, peanut butter o Low fat greek yogurt with blueberries and granola  o Egg white omelet with spinach and mushrooms o Breakfast couscous: whole wheat couscous, apricots, skim milk, cranberries  . Sandwiches:   o Hummus and grilled vegetables (peppers, zucchini, squash) on whole wheat bread   o Grilled chicken on whole wheat pita with lettuce, tomatoes, cucumbers or tzatziki  o Tuna salad on whole wheat bread: tuna salad made with greek yogurt, olives, red peppers, capers, green onions o Garlic rosemary lamb pita: lamb sauted with garlic, rosemary, salt & pepper; add lettuce, cucumber, greek yogurt to pita - flavor with lemon juice and black pepper  . Seafood:  o Mediterranean grilled salmon, seasoned with garlic, basil, parsley, lemon juice and black pepper o Shrimp, lemon, and spinach whole-grain pasta salad made with low fat greek yogurt  o Seared scallops with lemon orzo  o Seared tuna steaks seasoned salt, pepper, coriander topped with tomato mixture of olives, tomatoes, olive oil, minced garlic, parsley, green onions and cappers  . Meats:  o Herbed greek chicken salad with kalamata olives, cucumber, feta  o Red bell peppers stuffed with spinach, bulgur, lean ground beef (or lentils) & topped with feta   o Kebabs: skewers of chicken, tomatoes, onions, zucchini, squash  o Turkey burgers: made with red onions, mint, dill, lemon   juice, feta cheese topped with roasted red peppers . Vegetarian o Cucumber salad: cucumbers, artichoke hearts, celery, red onion, feta cheese, tossed in olive oil & lemon juice  o Hummus and whole grain pita points with a greek salad (lettuce, tomato, feta, olives, cucumbers, red onion) o Lentil soup with celery, carrots made with vegetable broth, garlic, salt and pepper  o Tabouli salad: parsley, bulgur, mint, scallions, cucumbers, tomato, radishes, lemon juice, olive oil, salt and pepper.  

## 2016-09-08 NOTE — Assessment & Plan Note (Signed)
Around afib- 10 yrs ago.

## 2016-09-15 DIAGNOSIS — H401123 Primary open-angle glaucoma, left eye, severe stage: Secondary | ICD-10-CM | POA: Diagnosis not present

## 2016-09-16 ENCOUNTER — Ambulatory Visit: Payer: Self-pay | Admitting: Oncology

## 2016-09-21 ENCOUNTER — Other Ambulatory Visit: Payer: Self-pay

## 2016-09-21 DIAGNOSIS — C50412 Malignant neoplasm of upper-outer quadrant of left female breast: Secondary | ICD-10-CM

## 2016-09-21 DIAGNOSIS — C3492 Malignant neoplasm of unspecified part of left bronchus or lung: Secondary | ICD-10-CM

## 2016-09-21 NOTE — Progress Notes (Signed)
Patricia [name]  [phone] Fax:([phone]) [phone]   ID: Patricia [name] OB: 1940/05/22  MR#: [id number]  TMA#:[id number]  PCP: Mellody Dance, DO GYN:   SU: Stark Klein OTHER MD: Thea Silversmith, Charlena Cross  CHIEF COMPLAINT: Estrogen receptor positive breast cancer  TREATMENT: Anastrozole; denosumab/Prolia  BREAST CANCER HISTORY: From the initial intake note 06/14/2013:  Patricia [name] had screening mammography with tomography at [facility] 05/26/2013 showing some architectural distortion in the left breast superiorly. Ultrasound of the left breast 06/02/2013 showed a 2 cm irregular mass superiorly in the left breast, medial to the scar on the skin from a prior biopsy. This mass was palpable. 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  On 06/13/2013 the patient underwent bilateral breast MRI. This showed a 2.8 cm irregular enhancing mass in the superior left breast but no additional suspicious masses, and no abnormal adenopathy, and no findings in the right breast.  The patient's subsequent history is as detailed below.  INTERVAL HISTORY: Patricia [name] returns today for follow-up and treatment of her estrogen receptor positive breast cancer. Interval history is generally benign. She continues on anastrozole, with excellent tolerance.Hot flashes and vaginal dryness are not a major issue. She never developed the arthralgias or myalgias that many patients can experience on this medication. She obtains it at a good price.  REVIEW OF SYSTEMS: Patricia [name] plays 9 holes of golf about 4 times a week. She doesn't exercise otherwise. She is having no bleeding or bruising, dictations from her apixaban. A detailed review of systems today was noncontributory  PAST MEDICAL  HISTORY: Past Medical History:  Diagnosis Date  . A-fib ([place])   . Allergic rhinitis, cause unspecified   . Aortic regurgitation 06/17/2015   Mild to moderate AR by echo 04/2015  . 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 Chronic cough   . Chronic diastolic heart failure ([place]) 06/17/2015  . CKD (chronic kidney disease), stage III   . Cough variant asthma   . Disease of pharynx or nasopharynx   . Diverticulosis   . Essential hypertension   . GERD (gastroesophageal reflux disease)   . Glaucoma   . Hiatal hernia   . Hyperlipidemia   . Internal hemorrhoids   . Laryngospasm   . Multinodular goiter   . Osteopenia   . Persistent atrial fibrillation ([place])    a. Dx 04/2015 at time of stroke. b. DCCV 06/2015 - did not hold. Amio started then stopped due to QT prolongation.  Marland Kitchen Postmenopausal   . Radiation 10/22/14-11/23/14   Left Breast  . Stricture and stenosis of cervix     PAST SURGICAL HISTORY: Past Surgical History:  Procedure Laterality Date  . BREAST SURGERY    . CARDIOVERSION N/A 06/24/2015   Procedure: CARDIOVERSION;  Surgeon: Sueanne Margarita, MD;  Location: [place];  Service: Cardiovascular;  Laterality: N/A;  . CARDIOVERSION N/A 05/19/2016   Procedure: CARDIOVERSION;  Surgeon: Sueanne Margarita, MD;  [facility];  Service: Cardiovascular;  Laterality: N/A;  . COLONOSCOPY    . LUNG CANCER SURGERY  2003   resection carcinoid lingula-lt upper lobe  . RADIOACTIVE SEED GUIDED MASTECTOMY WITH AXILLARY SENTINEL LYMPH NODE BIOPSY Left 06/26/2014   Procedure: RADIOACTIVE SEED GUIDED PARTIAL MASTECTOMY WITH AXILLARY SENTINEL  LYMPH NODE BIOPSY;  Surgeon: Stark Klein, MD;  Location: [place];  Service: General;  Laterality: Left;  . RE-EXCISION OF BREAST LUMPECTOMY Left 08/07/2014   Procedure: RE-EXCISION OF LEFT BREAST LUMPECTOMY;  Surgeon: Stark Klein, MD;  Location: [place];  Service: General;   Laterality: Left;  . RIGHT HEART CATH N/A 04/27/2016   Procedure: Right Heart Cath;  Surgeon: Larey Dresser, MD;  Location: [place] [facility];  Service: Cardiovascular;  Laterality: N/A;    FAMILY HISTORY Family History  Problem Relation Age of Onset  . Stroke Mother   . Hypertension Mother   . Hyperlipidemia Mother   . Stroke Father   . Transient ischemic attack Father   . Hyperlipidemia Father   . Hypertension Father   . Atrial fibrillation Son   . Heart attack Neg Hx    the patient's father died from pneumonia the age of 40, in the setting of dementia. The patient's mother died at the age of 31 following a stroke. The patient had one brother, 4 sisters. There is no history of breast or ovarian cancer in the family  GYNECOLOGIC HISTORY:  Menarche age 62, first live birth age 54, she is [place] P2. She did not use hormone replacement at menopause. She never took birth control pills.   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 Her husband Patricia [name] is retired from the [employer] and then worked for [employer]. He is an avid Animator. Son Patricia [name] lives in [place] and is an Art gallery manager. Daughter Patricia [name] lives in [place] and is a Astronomer. The patient has 5 grandchildren. She attends the [employer]    ADVANCED DIRECTIVES: In place   HEALTH MAINTENANCE: Social History  Substance Use Topics  . Smoking status: Former Smoker    Packs/day: 0.75    Years: 5.00    Quit date: 06/14/1961  . Smokeless tobacco: Never Used  . Alcohol use 0.0 oz/week     Comment: occasional wine     Colonoscopy:  PAP:  Bone density: 07/12/2013 at [facility], T score of -1.8 at the femoral neck  Lipid panel:  Allergies  Allergen Reactions  . Combigan [Brimonidine Tartrate-Timolol] Itching    Eyes itch, reddened  . Other Other (See Comments)    Per patient made OU red, Sore, and sensitivity to light  .  Sulfa Antibiotics Rash  . Sulfamethoxazole-Trimethoprim Hives    Current Outpatient Prescriptions  Medication Sig Dispense Refill  . ADVAIR HFA 230-21 MCG/ACT inhaler USE 2 INHALATIONS TWICE A DAY 36 g 1  . albuterol (PROVENTIL HFA;VENTOLIN HFA) 108 (90 Base) MCG/ACT inhaler Inhale 2 puffs into the lungs every 6 (six) hours as needed for wheezing or shortness of breath.    . anastrozole (ARIMIDEX) 1 MG tablet Take 1 tablet (1 mg total) by mouth daily. 90 tablet 3  . atorvastatin (LIPITOR) 40 MG tablet Take 1 tablet (40 mg total) by mouth daily. (Patient taking differently: Take 40 mg by mouth daily at 6 PM. ) 90 tablet 3  . digoxin (LANOXIN) 0.125 MG tablet TAKE 1 TABLET DAILY 90 tablet 3  . diltiazem (CARDIZEM CD) 180 MG 24 hr capsule TAKE 1 CAPSULE DAILY 90 capsule 1  . ELIQUIS 5 MG TABS tablet TAKE 1 TABLET TWICE A DAY 180 tablet 1  . furosemide (LASIX) 20 MG tablet TAKE 2 TABLETS EVERY MORNING AND 1 TABLET EVERY  EVENING 270 tablet 3  . gabapentin (NEURONTIN) 400 MG capsule Take 1 capsule (400 mg total) by mouth 2 (two) times daily. 180 capsule 1  . loratadine (CLARITIN) 10 MG tablet Take 10 mg by mouth daily as needed for allergies. Reported on 09/16/2015    . Multiple Vitamins-Iron (MULTIVITAMIN/IRON) TABS Take 1 tablet by mouth daily.     Marland Kitchen omeprazole (PRILOSEC) 20 MG capsule TAKE 1 CAPSULE DAILY 30 MINUTES BEFORE A MEAL 90 capsule 1  . potassium chloride SA (K-DUR,KLOR-CON) 20 MEQ tablet Take 1 tablet (20 mEq total) by mouth 2 (two) times daily. 180 tablet 3  . thyroid (ARMOUR THYROID) 60 MG tablet Take 1 tablet (60 mg total) by mouth daily before breakfast. 90 tablet 1   No current facility-administered medications for this visit.    Facility-Administered Medications Ordered in Other Visits  Medication Dose Route Frequency Provider Last Rate Last Dose  . denosumab (PROLIA) injection 60 mg  60 mg Subcutaneous Once Magrinat, Virgie Dad, MD        OBJECTIVE: Middle-aged white woman Who  appears stated age  89:   09/22/16 1524  BP: 105/86  Pulse: (!) 107  Resp: 18  Temp: 98.6 F (37 C)     Body mass index is 27.57 kg/m.    ECOG FS:0 - Asymptomatic Sclerae unicteric, EOMs intact Oropharynx clear and moist No cervical or supraclavicular adenopathy Lungs no rales or rhonchi Heart regular rate and rhythm Abd soft, nontender, positive bowel sounds MSK no focal spinal tenderness, no upper extremity lymphedema Neuro: nonfocal, well oriented, appropriate affect Breasts: The right breast is benign. The left breast is undergone lumpectomy and radiation with no evidence of local recurrence. Both axillae are benign.  LAB RESULTS:  CMP     Component Value Date/Time   NA 141 09/22/2016 1459   K 3.8 09/22/2016 1459   CL 105 05/25/2016 1409   CO2 26 09/22/2016 1459   GLUCOSE 116 09/22/2016 1459   GLUCOSE 88 01/13/2006 1012   BUN 19.8 09/22/2016 1459   CREATININE 1.1 09/22/2016 1459   CALCIUM 9.2 09/22/2016 1459   PROT 7.1 09/22/2016 1459   ALBUMIN 3.8 09/22/2016 1459   AST 15 09/22/2016 1459   ALT 21 09/22/2016 1459   ALKPHOS 98 09/22/2016 1459   BILITOT 0.65 09/22/2016 1459   GFRNONAA >60 05/25/2016 1409   GFRAA >60 05/25/2016 1409    I No results found for: SPEP  Lab Results  Component Value Date   WBC 8.5 09/22/2016   NEUTROABS 5.5 09/22/2016   HGB 15.1 09/22/2016   HCT 44.7 09/22/2016   MCV 93.6 09/22/2016   PLT 295 09/22/2016      Chemistry      Component Value Date/Time   NA 141 09/22/2016 1459   K 3.8 09/22/2016 1459   CL 105 05/25/2016 1409   CO2 26 09/22/2016 1459   BUN 19.8 09/22/2016 1459   CREATININE 1.1 09/22/2016 1459      Component Value Date/Time   CALCIUM 9.2 09/22/2016 1459   ALKPHOS 98 09/22/2016 1459   AST 15 09/22/2016 1459   ALT 21 09/22/2016 1459   BILITOT 0.65 09/22/2016 1459       No results found for: LABCA2  No components found for: LABCA125  No results for input(s): INR in the last 168  hours.  Urinalysis    Component Value Date/Time   COLORURINE YELLOW 04/28/2015 1114   APPEARANCEUR CLEAR 04/28/2015 1114   LABSPEC 1.010 04/28/2015 1114  PHURINE 6.5 04/28/2015 1114   GLUCOSEU NEGATIVE 04/28/2015 1114   HGBUR TRACE (A) 04/28/2015 1114   HGBUR negative 05/20/2007 0942   BILIRUBINUR NEGATIVE 04/28/2015 1114   KETONESUR NEGATIVE 04/28/2015 1114   PROTEINUR NEGATIVE 04/28/2015 1114   UROBILINOGEN 0.2 05/20/2007 0942   NITRITE NEGATIVE 04/28/2015 1114   LEUKOCYTESUR NEGATIVE 04/28/2015 1114    STUDIES: Bilateral diagnostic mammography at [facility] 07/21/2016 found a breast density to be category C. There was no evidence of malignancy.  ASSESSMENT: 76 y.o. [place] woman status post left breast upper outer quadrant biopsy 08/19/2013 for a clinical T2 N0, stage IIA invasive lobular breast cancer, estrogen receptor positive, progesterone receptor negative, with an MIB-1 of 20%. There was not sufficient tissue for HER-2 testing.  (1) neoadjuvant anastrozole started mid April 2015, discontinued early June 2015 with poor tolerance  (2) tamoxifen started first week in July 2015. Discontinued 06/20/15 after stroke.  (a) anastrozole resumed  07/01/2015   (3) osteopenia with a T score of -1.8 at the femoral necks 07/12/2013  (a) bone density at [facility]  07/09/2015 shows T score of -2.0  (b) Prolia started 09/24/2015  (4) left lumpectomy 06/26/2014 showed a pT1c pN0, stage IA invasive lobular carcinoma, grade 1, HER-2 negative, with positive margins   (a) additional surgery 08/07/2014 did not completely clear margins, but no further surgery was advised  (5) radiation completed 10/22/2014-11/23/2014 Site/dose:   Left breast/ 42.72 Gy at 2.67 Gy per fraction x 16 fractions.  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 This is very favorable.  She continues on anastrozole, with good tolerance. The one concern of course is the bone density. She was started on denosumab/Prolia for this year ago and is tolerating it well.  She will receive her treatment today, then again in 6 months, and then again in 12 months. We will repeat a bone density may of 2019  Otherwise I encouraged her to continue her excellent walking program (namely golf) and vitamin D supplementation  She knows to call for any problems that may develop before the next visit here.  Chauncey Cruel, MD   09/22/2016 8:31 PM

## 2016-09-22 ENCOUNTER — Ambulatory Visit: Payer: Medicare Other

## 2016-09-22 ENCOUNTER — Other Ambulatory Visit (HOSPITAL_BASED_OUTPATIENT_CLINIC_OR_DEPARTMENT_OTHER): Payer: Medicare Other

## 2016-09-22 ENCOUNTER — Ambulatory Visit (HOSPITAL_BASED_OUTPATIENT_CLINIC_OR_DEPARTMENT_OTHER): Payer: Medicare Other | Admitting: Oncology

## 2016-09-22 VITALS — BP 105/86 | HR 107 | Temp 98.6°F | Resp 18 | Ht 59.0 in | Wt 136.5 lb

## 2016-09-22 DIAGNOSIS — C50412 Malignant neoplasm of upper-outer quadrant of left female breast: Secondary | ICD-10-CM

## 2016-09-22 DIAGNOSIS — C3410 Malignant neoplasm of upper lobe, unspecified bronchus or lung: Secondary | ICD-10-CM

## 2016-09-22 DIAGNOSIS — Z79811 Long term (current) use of aromatase inhibitors: Secondary | ICD-10-CM

## 2016-09-22 DIAGNOSIS — M8580 Other specified disorders of bone density and structure, unspecified site: Secondary | ICD-10-CM

## 2016-09-22 DIAGNOSIS — Z170 Estrogen receptor positive status [ER+]: Secondary | ICD-10-CM | POA: Diagnosis not present

## 2016-09-22 DIAGNOSIS — C3492 Malignant neoplasm of unspecified part of left bronchus or lung: Secondary | ICD-10-CM

## 2016-09-22 DIAGNOSIS — Z85110 Personal history of malignant carcinoid tumor of bronchus and lung: Secondary | ICD-10-CM | POA: Diagnosis not present

## 2016-09-22 LAB — COMPREHENSIVE METABOLIC PANEL
ALBUMIN: 3.8 g/dL (ref 3.5–5.0)
ALK PHOS: 98 U/L (ref 40–150)
ALT: 21 U/L (ref 0–55)
AST: 15 U/L (ref 5–34)
Anion Gap: 9 mEq/L (ref 3–11)
BILIRUBIN TOTAL: 0.65 mg/dL (ref 0.20–1.20)
BUN: 19.8 mg/dL (ref 7.0–26.0)
CALCIUM: 9.2 mg/dL (ref 8.4–10.4)
CO2: 26 mEq/L (ref 22–29)
CREATININE: 1.1 mg/dL (ref 0.6–1.1)
Chloride: 105 mEq/L (ref 98–109)
EGFR: 48 mL/min/{1.73_m2} — ABNORMAL LOW (ref >90)
Glucose: 116 mg/dl (ref 70–140)
Potassium: 3.8 mEq/L (ref 3.5–5.1)
Sodium: 141 mEq/L (ref 136–145)
Total Protein: 7.1 g/dL (ref 6.4–8.3)

## 2016-09-22 LAB — CBC WITH DIFFERENTIAL/PLATELET
BASO%: 0.8 % (ref 0.0–2.0)
Basophils Absolute: 0.1 10*3/uL (ref 0.0–0.1)
EOS%: 0 % (ref 0.0–7.0)
Eosinophils Absolute: 0 10*3/uL (ref 0.0–0.5)
HCT: 44.7 % (ref 34.8–46.6)
HGB: 15.1 g/dL (ref 11.6–15.9)
LYMPH%: 26.5 % (ref 14.0–49.7)
MCH: 31.7 pg (ref 25.1–34.0)
MCHC: 33.9 g/dL (ref 31.5–36.0)
MCV: 93.6 fL (ref 79.5–101.0)
MONO#: 0.7 10*3/uL (ref 0.1–0.9)
MONO%: 7.8 % (ref 0.0–14.0)
NEUT%: 64.9 % (ref 38.4–76.8)
NEUTROS ABS: 5.5 10*3/uL (ref 1.5–6.5)
PLATELETS: 295 10*3/uL (ref 145–400)
RBC: 4.78 10*6/uL (ref 3.70–5.45)
RDW: 13.7 % (ref 11.2–14.5)
WBC: 8.5 10*3/uL (ref 3.9–10.3)
lymph#: 2.3 10*3/uL (ref 0.9–3.3)

## 2016-09-22 LAB — DRAW EXTRA CLOT TUBE

## 2016-09-22 MED ORDER — DENOSUMAB 60 MG/ML ~~LOC~~ SOLN
60.0000 mg | Freq: Once | SUBCUTANEOUS | Status: DC
  Filled 2016-09-22: qty 1

## 2016-09-22 NOTE — Progress Notes (Unsigned)
Paged pt several times to give injection and was unable to locate.

## 2016-09-28 ENCOUNTER — Ambulatory Visit: Payer: Medicare Other | Admitting: Family Medicine

## 2016-10-29 ENCOUNTER — Other Ambulatory Visit: Payer: Self-pay

## 2016-10-29 DIAGNOSIS — E032 Hypothyroidism due to medicaments and other exogenous substances: Secondary | ICD-10-CM

## 2016-10-29 NOTE — Telephone Encounter (Signed)
Last thyroid function test in patient's chart was early 2018 and was abnormal.  Patient needs labs before I will refill.

## 2016-10-29 NOTE — Telephone Encounter (Signed)
Patient requesting a refill on Armour Thyroid 60mg . Medication was last fill by previous provider.  Sent to Dr. [name] for review.  MPulliam, CMA/RT(R)

## 2016-10-30 ENCOUNTER — Other Ambulatory Visit: Payer: Self-pay

## 2016-10-30 DIAGNOSIS — E032 Hypothyroidism due to medicaments and other exogenous substances: Secondary | ICD-10-CM

## 2016-10-30 DIAGNOSIS — Z131 Encounter for screening for diabetes mellitus: Secondary | ICD-10-CM

## 2016-10-30 DIAGNOSIS — N183 Chronic kidney disease, stage 3 unspecified: Secondary | ICD-10-CM

## 2016-10-30 DIAGNOSIS — I10 Essential (primary) hypertension: Secondary | ICD-10-CM

## 2016-10-30 DIAGNOSIS — E7849 Other hyperlipidemia: Secondary | ICD-10-CM

## 2016-10-30 DIAGNOSIS — I5032 Chronic diastolic (congestive) heart failure: Secondary | ICD-10-CM

## 2016-10-30 DIAGNOSIS — M8580 Other specified disorders of bone density and structure, unspecified site: Secondary | ICD-10-CM

## 2016-10-30 DIAGNOSIS — I4891 Unspecified atrial fibrillation: Secondary | ICD-10-CM

## 2016-10-30 DIAGNOSIS — R5383 Other fatigue: Secondary | ICD-10-CM

## 2016-10-30 NOTE — Telephone Encounter (Signed)
Patient is going to come in Tuesday afternoon for labs.  MPulliam, CMA/RT(R)

## 2016-11-05 ENCOUNTER — Other Ambulatory Visit: Payer: Self-pay | Admitting: Cardiology

## 2016-11-05 ENCOUNTER — Other Ambulatory Visit: Payer: Medicare Other

## 2016-11-05 DIAGNOSIS — I10 Essential (primary) hypertension: Secondary | ICD-10-CM

## 2016-11-05 DIAGNOSIS — Z131 Encounter for screening for diabetes mellitus: Secondary | ICD-10-CM

## 2016-11-05 DIAGNOSIS — R5383 Other fatigue: Secondary | ICD-10-CM | POA: Diagnosis not present

## 2016-11-05 DIAGNOSIS — N183 Chronic kidney disease, stage 3 unspecified: Secondary | ICD-10-CM

## 2016-11-05 DIAGNOSIS — E032 Hypothyroidism due to medicaments and other exogenous substances: Secondary | ICD-10-CM | POA: Diagnosis not present

## 2016-11-05 DIAGNOSIS — I4891 Unspecified atrial fibrillation: Secondary | ICD-10-CM

## 2016-11-05 DIAGNOSIS — M8580 Other specified disorders of bone density and structure, unspecified site: Secondary | ICD-10-CM

## 2016-11-05 DIAGNOSIS — E784 Other hyperlipidemia: Secondary | ICD-10-CM | POA: Diagnosis not present

## 2016-11-05 DIAGNOSIS — I5032 Chronic diastolic (congestive) heart failure: Secondary | ICD-10-CM

## 2016-11-05 DIAGNOSIS — E7849 Other hyperlipidemia: Secondary | ICD-10-CM

## 2016-11-05 DIAGNOSIS — R7302 Impaired glucose tolerance (oral): Secondary | ICD-10-CM | POA: Diagnosis not present

## 2016-11-05 DIAGNOSIS — Z1321 Encounter for screening for nutritional disorder: Secondary | ICD-10-CM | POA: Diagnosis not present

## 2016-11-06 LAB — LIPID PANEL
CHOL/HDL RATIO: 3.2 ratio (ref 0.0–4.4)
Cholesterol, Total: 136 mg/dL (ref 100–199)
HDL: 43 mg/dL (ref >39)
LDL CALC: 70 mg/dL (ref 0–99)
TRIGLYCERIDES: 116 mg/dL (ref 0–149)
VLDL Cholesterol Cal: 23 mg/dL (ref 5–40)

## 2016-11-06 LAB — T4, FREE: Free T4: 0.95 ng/dL (ref 0.82–1.77)

## 2016-11-06 LAB — COMPREHENSIVE METABOLIC PANEL
A/G RATIO: 1.8 (ref 1.2–2.2)
ALBUMIN: 4.2 g/dL (ref 3.5–4.8)
ALK PHOS: 95 IU/L (ref 39–117)
ALT: 21 IU/L (ref 0–32)
AST: 14 IU/L (ref 0–40)
BILIRUBIN TOTAL: 0.6 mg/dL (ref 0.0–1.2)
BUN / CREAT RATIO: 18 (ref 12–28)
BUN: 18 mg/dL (ref 8–27)
CHLORIDE: 102 mmol/L (ref 96–106)
CO2: 24 mmol/L (ref 20–29)
Calcium: 9.2 mg/dL (ref 8.7–10.3)
Creatinine, Ser: 0.98 mg/dL (ref 0.57–1.00)
GFR calc non Af Amer: 56 mL/min/{1.73_m2} — ABNORMAL LOW (ref >59)
GFR, EST AFRICAN AMERICAN: 65 mL/min/{1.73_m2} (ref >59)
GLOBULIN, TOTAL: 2.4 g/dL (ref 1.5–4.5)
Glucose: 116 mg/dL — ABNORMAL HIGH (ref 65–99)
POTASSIUM: 4.2 mmol/L (ref 3.5–5.2)
SODIUM: 143 mmol/L (ref 134–144)
TOTAL PROTEIN: 6.6 g/dL (ref 6.0–8.5)

## 2016-11-06 LAB — CBC WITH DIFFERENTIAL
BASOS ABS: 0 10*3/uL (ref 0.0–0.2)
BASOS: 0 %
EOS (ABSOLUTE): 0 10*3/uL (ref 0.0–0.4)
Eos: 0 %
Hematocrit: 42.7 % (ref 34.0–46.6)
Hemoglobin: 14.8 g/dL (ref 11.1–15.9)
IMMATURE GRANS (ABS): 0 10*3/uL (ref 0.0–0.1)
Immature Granulocytes: 1 %
LYMPHS ABS: 1.6 10*3/uL (ref 0.7–3.1)
LYMPHS: 25 %
MCH: 32 pg (ref 26.6–33.0)
MCHC: 34.7 g/dL (ref 31.5–35.7)
MCV: 92 fL (ref 79–97)
MONOCYTES: 9 %
Monocytes Absolute: 0.6 10*3/uL (ref 0.1–0.9)
NEUTROS ABS: 4.4 10*3/uL (ref 1.4–7.0)
Neutrophils: 65 %
RBC: 4.63 x10E6/uL (ref 3.77–5.28)
RDW: 13.9 % (ref 12.3–15.4)
WBC: 6.6 10*3/uL (ref 3.4–10.8)

## 2016-11-06 LAB — HEMOGLOBIN A1C
Est. average glucose Bld gHb Est-mCnc: 126 mg/dL
HEMOGLOBIN A1C: 6 % — AB (ref 4.8–5.6)

## 2016-11-06 LAB — VITAMIN B12: VITAMIN B 12: 441 pg/mL (ref 232–1245)

## 2016-11-06 LAB — TSH: TSH: 1.54 u[IU]/mL (ref 0.450–4.500)

## 2016-11-06 LAB — VITAMIN D 25 HYDROXY (VIT D DEFICIENCY, FRACTURES): VIT D 25 HYDROXY: 26.7 ng/mL — AB (ref 30.0–100.0)

## 2016-11-11 ENCOUNTER — Other Ambulatory Visit: Payer: Self-pay

## 2016-11-11 DIAGNOSIS — E032 Hypothyroidism due to medicaments and other exogenous substances: Secondary | ICD-10-CM

## 2016-11-11 NOTE — Telephone Encounter (Signed)
Patient requesting a refill on Armour Thyroid.  Medication was last filled by a pervious provider, sent to Dr. [name] to review.  MPulliam, CMA/RT(R)

## 2016-11-13 MED ORDER — THYROID 60 MG PO TABS: 60.0000 mg | ORAL_TABLET | Freq: Every day | ORAL | 0 refills | Status: DC

## 2016-11-18 NOTE — Progress Notes (Signed)
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 She has risk factors of hypertension atrial fibrillation hyperlipidemia. She remains on eliquis for secondary stroke prevention and atrial fibrillation. She has minimal bruising and no bleeding she remains on Lipitor without myalgias. She is currently playing golf 3 times a week. Blood pressure in the office today 139/76. She continues to participate in normal activities and is back to her baseline. She says her memory is stable. She is following a low-fat diet. She has no new neurologic complaints.  UPDATE 03/15/2018[name], 76 year old female returns for follow-up with history of CVA in February 2017 with risk factors of hypertension atrial fibrillation and hyperlipidemia. She is currently on eliquis for secondary stroke prevention and atrial fibrillation without recurrent stroke or TIA symptoms. She was found to be in atrial fib during cardiac cath last month, she is due for cardioversion next week by Dr. Radford Pax. She remains on Lipitor for hyperlipidemia without complaints of myalgias. Blood pressure the office today 110/82. She is continuing to exercise by playing golf several times a week and has to increase that as the weather improves. She is participating in her normal activities and feels she is back to baseline. She does however state her husband says she has some memory issues. It is noted that her recent TSH was 19.27 with adjustment in  medication She is not aware of her irregular heartbeat she returns for reevaluation   UPDATE 07/13/2017[name], 76 year old female returns for follow-up. She has a history of stroke event in  February 2017. She has not had further stroke or TIA symptoms. She is currently on Eliquis for secondary stroke prevention and atrial fibrillation along with Lipitor. She recently had some fluid retention and was placed on Lasix. She says she is playing some golf and is back to most of her normal activities. She returns for reevaluation  HISTORY: 06/04/15 CMMary [name] is an 76 y.o. female returns for hospital follow-up after a stroke in February. She was originally admitted for evaluation of chest pain . On admission she stated that her speech had been slurred for 2 days. A new diagnosis of paroxysmal atrial fibrillation was made. Given the slurred speech and atrial fibrillation, possible stroke was suspected. 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 Shower emboli were suspected based upon the overall MRI appearance. Patient was not considered for TPA secondary to delay in arrival. CT angiogram of the head and neck was unremarkable. 2-D echo no source of embolism. LDL 95. Hemoglobin A1c 5.7. She was switched to Eliquis from daily aspirin prior to admission. She was changed to Crestor for her LDL however her insurance would not cover this. She was placed on Lipitor 80 mg, but Dr. Radford Pax reduced to 40 mg at follow-up visit. She has not had further stroke or TIA symptoms. She does complain of some fatigue she returns for reevaluation REVIEW OF SYSTEMS: Full 14 system review of systems performed and notable only for those listed, all others are neg:  Constitutional: neg  Cardiovascular: For cardioversion next week   Ear/Nose/Throat: neg  Skin: neg Eyes: neg Respiratory: neg Gastroitestinal: neg  Hematology/Lymphatic: neg  Endocrine: neg Musculoskeletal:neg Allergy/Immunology: neg Neurological: neg Psychiatric: neg  Sleep : neg   ALLERGIES: Allergies  Allergen Reactions  . Combigan [Brimonidine Tartrate-Timolol] Itching    Eyes itch,  reddened  . Other Other (See Comments)    Per patient made OU red, Sore, and sensitivity to light  . Sulfa Antibiotics Rash  . Sulfamethoxazole-Trimethoprim Hives    HOME MEDICATIONS: Outpatient Medications Prior to Visit  Medication Sig Dispense Refill  . ADVAIR HFA 230-21 MCG/ACT inhaler USE 2 INHALATIONS TWICE A DAY 36 g 1  . albuterol (PROVENTIL HFA;VENTOLIN HFA) 108 (90 Base) MCG/ACT inhaler Inhale 2 puffs into the lungs every 6 (six) hours as needed for wheezing or shortness of breath.    . anastrozole (ARIMIDEX) 1 MG tablet Take 1 tablet (1 mg total) by mouth daily. 90 tablet 3  . atorvastatin (LIPITOR) 40 MG tablet TAKE 1 TABLET DAILY 90 tablet 3  . digoxin (LANOXIN) 0.125 MG tablet TAKE 1 TABLET DAILY 90 tablet 3  . diltiazem (CARDIZEM CD) 180 MG 24 hr capsule TAKE 1 CAPSULE DAILY 90 capsule 1  . ELIQUIS 5 MG TABS tablet TAKE 1 TABLET TWICE A DAY 180 tablet 1  . furosemide (LASIX) 20 MG tablet TAKE 2 TABLETS EVERY MORNING AND 1 TABLET EVERY EVENING 270 tablet 3  . gabapentin (NEURONTIN) 400 MG capsule Take 1 capsule (400 mg total) by mouth 2 (two) times daily. 180 capsule 1  . loratadine (CLARITIN) 10 MG tablet Take 10 mg by mouth daily as needed for allergies. Reported on 09/16/2015    . Multiple Vitamins-Iron (MULTIVITAMIN/IRON) TABS Take 1 tablet by mouth daily.     Marland Kitchen omeprazole (PRILOSEC) 20 MG capsule TAKE 1 CAPSULE DAILY 30 MINUTES BEFORE A MEAL 90 capsule 1  . potassium chloride SA (K-DUR,KLOR-CON) 20 MEQ tablet Take 1 tablet (20 mEq total) by mouth 2 (two) times daily. 180 tablet 3  . thyroid (ARMOUR THYROID) 60 MG tablet Take 1 tablet (60 mg total) by mouth daily before breakfast. 90 tablet 0   Facility-Administered Medications Prior to Visit  Medication Dose Route Frequency Provider Last Rate Last Dose  . denosumab (PROLIA) injection 60 mg  60 mg Subcutaneous Once Magrinat, Virgie Dad, MD        PAST MEDICAL HISTORY: Past Medical History:  Diagnosis Date  . A-fib  ([place])   . Allergic rhinitis, cause unspecified   . Aortic regurgitation 06/17/2015   Mild to moderate AR by echo 04/2015  . 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 Chronic cough   . Chronic diastolic heart failure ([place]) 06/17/2015  . CKD (chronic kidney disease), stage III   . Cough variant asthma   . Disease of pharynx or nasopharynx   . Diverticulosis   . Essential hypertension   . GERD (gastroesophageal reflux disease)   . Glaucoma   . Hiatal hernia   . Hyperlipidemia   . Internal hemorrhoids   . Laryngospasm   . Multinodular goiter   . Osteopenia   . Persistent atrial fibrillation ([place])    a. Dx 04/2015 at time of stroke. b. DCCV 06/2015 - did not hold. Amio started then stopped due to QT prolongation.  Marland Kitchen Postmenopausal   . Radiation 10/22/14-11/23/14   Left Breast  . Stricture and stenosis of cervix     PAST SURGICAL HISTORY: Past Surgical History:  Procedure Laterality Date  . BREAST SURGERY    . CARDIOVERSION N/A 06/24/2015   Procedure: CARDIOVERSION;  Surgeon:  Sueanne Margarita, MD;  Location: [place];  Service: Cardiovascular;  Laterality: N/A;  . CARDIOVERSION N/A 05/19/2016   Procedure: CARDIOVERSION;  Surgeon: Sueanne Margarita, MD;  [facility];  Service: Cardiovascular;  Laterality: N/A;  . COLONOSCOPY    . LUNG CANCER SURGERY  2003   resection carcinoid lingula-lt upper lobe  . RADIOACTIVE SEED GUIDED MASTECTOMY WITH AXILLARY SENTINEL LYMPH NODE BIOPSY Left 06/26/2014   Procedure: RADIOACTIVE SEED GUIDED PARTIAL MASTECTOMY WITH AXILLARY SENTINEL LYMPH NODE BIOPSY;  Surgeon: Stark Klein, MD;  Location: [place];  Service: General;  Laterality: Left;  . RE-EXCISION OF BREAST LUMPECTOMY Left 08/07/2014   Procedure: RE-EXCISION OF LEFT BREAST LUMPECTOMY;  Surgeon: Stark Klein, MD;  Location: [place];  Service: General;  Laterality: Left;  . RIGHT HEART CATH N/A 04/27/2016    Procedure: Right Heart Cath;  Surgeon: Larey Dresser, MD;  Location: [place] [facility];  Service: Cardiovascular;  Laterality: N/A;    FAMILY HISTORY: Family History  Problem Relation Age of Onset  . Stroke Mother   . Hypertension Mother   . Hyperlipidemia Mother   . Stroke Father   . Transient ischemic attack Father   . Hyperlipidemia Father   . Hypertension Father   . Atrial fibrillation Son   . Heart attack Neg Hx     SOCIAL HISTORY: Social History   Social History  . Marital status: Married    Spouse name: N/A  . Number of children: N/A  . Years of education: N/A   Occupational History  . Not on file.   Social History Main Topics  . Smoking status: Former Smoker    Packs/day: 0.75    Years: 5.00    Quit date: 06/14/1961  . Smokeless tobacco: Never Used  . Alcohol use 0.0 oz/week     Comment: occasional wine  . Drug use: No  . Sexual activity: Not Currently   Other Topics Concern  . Not on file   Social History Narrative   Retired. Lives with husband.      PHYSICAL EXAM  Vitals:   11/19/16 1057  BP: 139/76  Pulse: 86  Weight: 131 lb 9.6 oz (59.7 kg)  Height: 4\' 11"  (1.499 m)   Body mass index is 26.58 kg/m. Generalized: Well developed, in no acute distress  Head: normocephalic and atraumatic,. Oropharynx benign  Neck: Supple, no carotid bruits  Cardiac:regular rate and rhythm no murmur Skin no peripheral edema  Musculoskeletal: No deformity   Neurological examination   Mentation: Alert oriented to time, place, history taking. Attention span and concentration appropriate. Recent and remote memory intact. Follows all commands speech and language fluent.MMSE 29/30. Missing the date .AFT 12. Clock drawing 4/4   Cranial nerve II-XII: Pupils were equal round reactive to light extraocular movements were full, visual field were full on confrontational test. Facial sensation and strength were normal. hearing was intact to finger rubbing  bilaterally. Uvula tongue midline. head turning and shoulder shrug were normal and symmetric.Tongue protrusion into cheek strength was normal. Motor: normal bulk and tone, full strength in the BUE, BLE, fine finger movements normal, no pronator drift. No focal weakness Sensory: normal and symmetric to light touch, pinprick and vibratory in the upper and lower extremities  Coordination: finger-nose-finger, heel-to-shin bilaterally, no dysmetria Reflexes: 1+ upper lower and symmetric plantar responses were flexor bilaterally. Gait and Station: Rising up from seated position without assistance, normal stance, moderate stride, good arm swing, smooth turning, able to  perform tiptoe, and heel walking without difficulty. Tandem gait is steady  DIAGNOSTIC DATA (LABS, IMAGING, TESTING) - I reviewed patient records, labs, notes, testing and imaging myself where available.  Lab Results  Component Value Date   WBC 6.6 11/05/2016   HGB 14.8 11/05/2016   HCT 42.7 11/05/2016   MCV 92 11/05/2016   PLT 295 09/22/2016      Component Value Date/Time   NA 143 11/05/2016 0831   NA 141 09/22/2016 1459   K 4.2 11/05/2016 0831   K 3.8 09/22/2016 1459   CL 102 11/05/2016 0831   CO2 24 11/05/2016 0831   CO2 26 09/22/2016 1459   GLUCOSE 116 (H) 11/05/2016 0831   GLUCOSE 116 09/22/2016 1459   GLUCOSE 88 01/13/2006 1012   BUN 18 11/05/2016 0831   BUN 19.8 09/22/2016 1459   CREATININE 0.98 11/05/2016 0831   CREATININE 1.1 09/22/2016 1459   CALCIUM 9.2 11/05/2016 0831   CALCIUM 9.2 09/22/2016 1459   PROT 6.6 11/05/2016 0831   PROT 7.1 09/22/2016 1459   ALBUMIN 4.2 11/05/2016 0831   ALBUMIN 3.8 09/22/2016 1459   AST 14 11/05/2016 0831   AST 15 09/22/2016 1459   ALT 21 11/05/2016 0831   ALT 21 09/22/2016 1459   ALKPHOS 95 11/05/2016 0831   ALKPHOS 98 09/22/2016 1459   BILITOT 0.6 11/05/2016 0831   BILITOT 0.65 09/22/2016 1459   GFRNONAA 56 (L) 11/05/2016 0831   GFRAA 65 11/05/2016 0831   Lab  Results  Component Value Date   CHOL 136 11/05/2016   HDL 43 11/05/2016   LDLCALC 70 11/05/2016   LDLDIRECT 174.3 06/04/2009   TRIG 116 11/05/2016   CHOLHDL 3.2 11/05/2016   Lab Results  Component Value Date   HGBA1C 6.0 (H) 11/05/2016   Lab Results  Component Value Date   VITAMINB12 441 11/05/2016   Lab Results  Component Value Date   TSH 1.540 11/05/2016      ASSESSMENT AND PLAN 76 y.o. year old female returns for follow-up with history of  right MCA infarct  newly diagnosed atrial fibrillation.She has risk factors of hypertension, hyperlipidemia.  The patient is a current patient of Dr. Erlinda Hong  who is out of the office today . This note is sent to the work in doctor.     PLAN: Continue to Monitor stroke risk factors and follow up with PCP Continue Eliquis for atrial fibrillation and secondary stroke prevention Keep systolic B/P less than 287 continue present management Continue statin Lipitor 40 mg daily for secondary stroke prevention  Heart healthy low sodium diet Continue exercise as tolerated and golf Stay well hydrated Memory score is stable Will dismiss from stroke clinic I spent 25 minutes in total face to face time with the patient more than 50% of which was spent counseling and coordination of care, reviewing test results reviewing medications and discussing and reviewing the diagnosis of stroke and importance of management of risk factors. Written information provided as well Dennie Bible, [facility], [facility], APRN  [facility] [address] [place], [place] [address] [phone]

## 2016-11-19 ENCOUNTER — Encounter: Payer: Self-pay | Admitting: Nurse Practitioner

## 2016-11-19 ENCOUNTER — Ambulatory Visit (INDEPENDENT_AMBULATORY_CARE_PROVIDER_SITE_OTHER): Payer: Medicare Other | Admitting: Nurse Practitioner

## 2016-11-19 VITALS — BP 139/76 | HR 86 | Ht 59.0 in | Wt 131.6 lb

## 2016-11-19 DIAGNOSIS — I10 Essential (primary) hypertension: Secondary | ICD-10-CM | POA: Diagnosis not present

## 2016-11-19 DIAGNOSIS — Z8673 Personal history of transient ischemic attack (TIA), and cerebral infarction without residual deficits: Secondary | ICD-10-CM

## 2016-11-19 DIAGNOSIS — E784 Other hyperlipidemia: Secondary | ICD-10-CM

## 2016-11-19 DIAGNOSIS — I638 Other cerebral infarction: Secondary | ICD-10-CM

## 2016-11-19 DIAGNOSIS — E7849 Other hyperlipidemia: Secondary | ICD-10-CM

## 2016-11-19 NOTE — Patient Instructions (Addendum)
Continue to Monitor stroke risk factors and follow up with PCP Continue Eliquis for atrial fibrillation and secondary stroke prevention Keep systolic B/P less than 182 continue present management Continue statin Lipitor 40 mg daily for secondary stroke prevention  Heart healthy low sodium diet Continue exercise as tolerated and golf Stay well hydrated Memory score is stable Will dismiss from stroke clinic Stroke Prevention Some medical conditions and behaviors are associated with an increased chance of having a stroke. You may prevent a stroke by making healthy choices and managing medical conditions. How can I reduce my risk of having a stroke?  Stay physically active. Get at least 30 minutes of activity on most or all days.  Do not smoke. It may also be helpful to avoid exposure to secondhand smoke.  Limit alcohol use. Moderate alcohol use is considered to be: ? No more than 2 drinks per day for men. ? No more than 1 drink per day for nonpregnant women.  Eat healthy foods. This involves: ? Eating 5 or more servings of fruits and vegetables a day. ? Making dietary changes that address high blood pressure (hypertension), high cholesterol, diabetes, or obesity.  Manage your cholesterol levels. ? Making food choices that are high in fiber and low in saturated fat, trans fat, and cholesterol may control cholesterol levels. ? Take any prescribed medicines to control cholesterol as directed by your health care provider.  Manage your diabetes. ? Controlling your carbohydrate and sugar intake is recommended to manage diabetes. ? Take any prescribed medicines to control diabetes as directed by your health care provider.  Control your hypertension. ? Making food choices that are low in salt (sodium), saturated fat, trans fat, and cholesterol is recommended to manage hypertension. ? Ask your health care provider if you need treatment to lower your blood pressure. Take any prescribed  medicines to control hypertension as directed by your health care provider. ? If you are 95-71 years of age, have your blood pressure checked every 3-5 years. If you are 36 years of age or older, have your blood pressure checked every year.  Maintain a healthy weight. ? Reducing calorie intake and making food choices that are low in sodium, saturated fat, trans fat, and cholesterol are recommended to manage weight.  Stop drug abuse.  Avoid taking birth control pills. ? Talk to your health care provider about the risks of taking birth control pills if you are over 74 years old, smoke, get migraines, or have ever had a blood clot.  Get evaluated for sleep disorders (sleep apnea). ? Talk to your health care provider about getting a sleep evaluation if you snore a lot or have excessive sleepiness.  Take medicines only as directed by your health care provider. ? For some people, aspirin or blood thinners (anticoagulants) are helpful in reducing the risk of forming abnormal blood clots that can lead to stroke. If you have the irregular heart rhythm of atrial fibrillation, you should be on a blood thinner unless there is a good reason you cannot take them. ? Understand all your medicine instructions.  Make sure that other conditions (such as anemia or atherosclerosis) are addressed. Get help right away if:  You have sudden weakness or numbness of the face, arm, or leg, especially on one side of the body.  Your face or eyelid droops to one side.  You have sudden confusion.  You have trouble speaking (aphasia) or understanding.  You have sudden trouble seeing in one or both eyes.  You have sudden trouble walking.  You have dizziness.  You have a loss of balance or coordination.  You have a sudden, severe headache with no known cause.  You have new chest pain or an irregular heartbeat. Any of these symptoms may represent a serious problem that is an emergency. Do not wait to see if the  symptoms will go away. Get medical help at once. Call your local emergency services (911 in [place] Do not drive yourself to the hospital. This information is not intended to replace advice given to you by your health care provider. Make sure you discuss any questions you have with your health care provider. Document Released: 03/26/2004 Document Revised: 07/25/2015 Document Reviewed: 08/19/2012 Elsevier Interactive Patient Education  2017 [employer].

## 2016-11-25 ENCOUNTER — Telehealth: Payer: Self-pay

## 2016-11-25 ENCOUNTER — Other Ambulatory Visit: Payer: Self-pay | Admitting: Cardiology

## 2016-11-25 NOTE — Telephone Encounter (Signed)
Received Epic notification that pt had not read MyChart message sent on 11/10/16.  Verbal report given to pt and confirmed appt info with the patient.  Charyl Bigger, CMA

## 2016-11-25 NOTE — Telephone Encounter (Signed)
Eliquis 5mg  refill request received; pt is 76 yrs old, Wt-59.7kg, Crea-0.98 on 11/15/16, last seen by Roderic Palau on 05/25/16. Will send in refill request to requested pharamacy.

## 2016-12-01 ENCOUNTER — Telehealth: Payer: Self-pay | Admitting: Family Medicine

## 2016-12-01 DIAGNOSIS — I4819 Other persistent atrial fibrillation: Secondary | ICD-10-CM

## 2016-12-01 DIAGNOSIS — I351 Nonrheumatic aortic (valve) insufficiency: Secondary | ICD-10-CM

## 2016-12-01 DIAGNOSIS — I10 Essential (primary) hypertension: Secondary | ICD-10-CM

## 2016-12-01 DIAGNOSIS — I5032 Chronic diastolic (congestive) heart failure: Secondary | ICD-10-CM

## 2016-12-01 IMAGING — DX DG CHEST 2V
2 series · 2 of 2 positions shown · non-contrast
Comparison: PA and lateral chest x-ray April 29, 2015

CLINICAL DATA: Acute pulmonary edema recently, history of asthma,
lung malignancy, former smoker.

EXAM:
CHEST  2 VIEW

[chest pa]
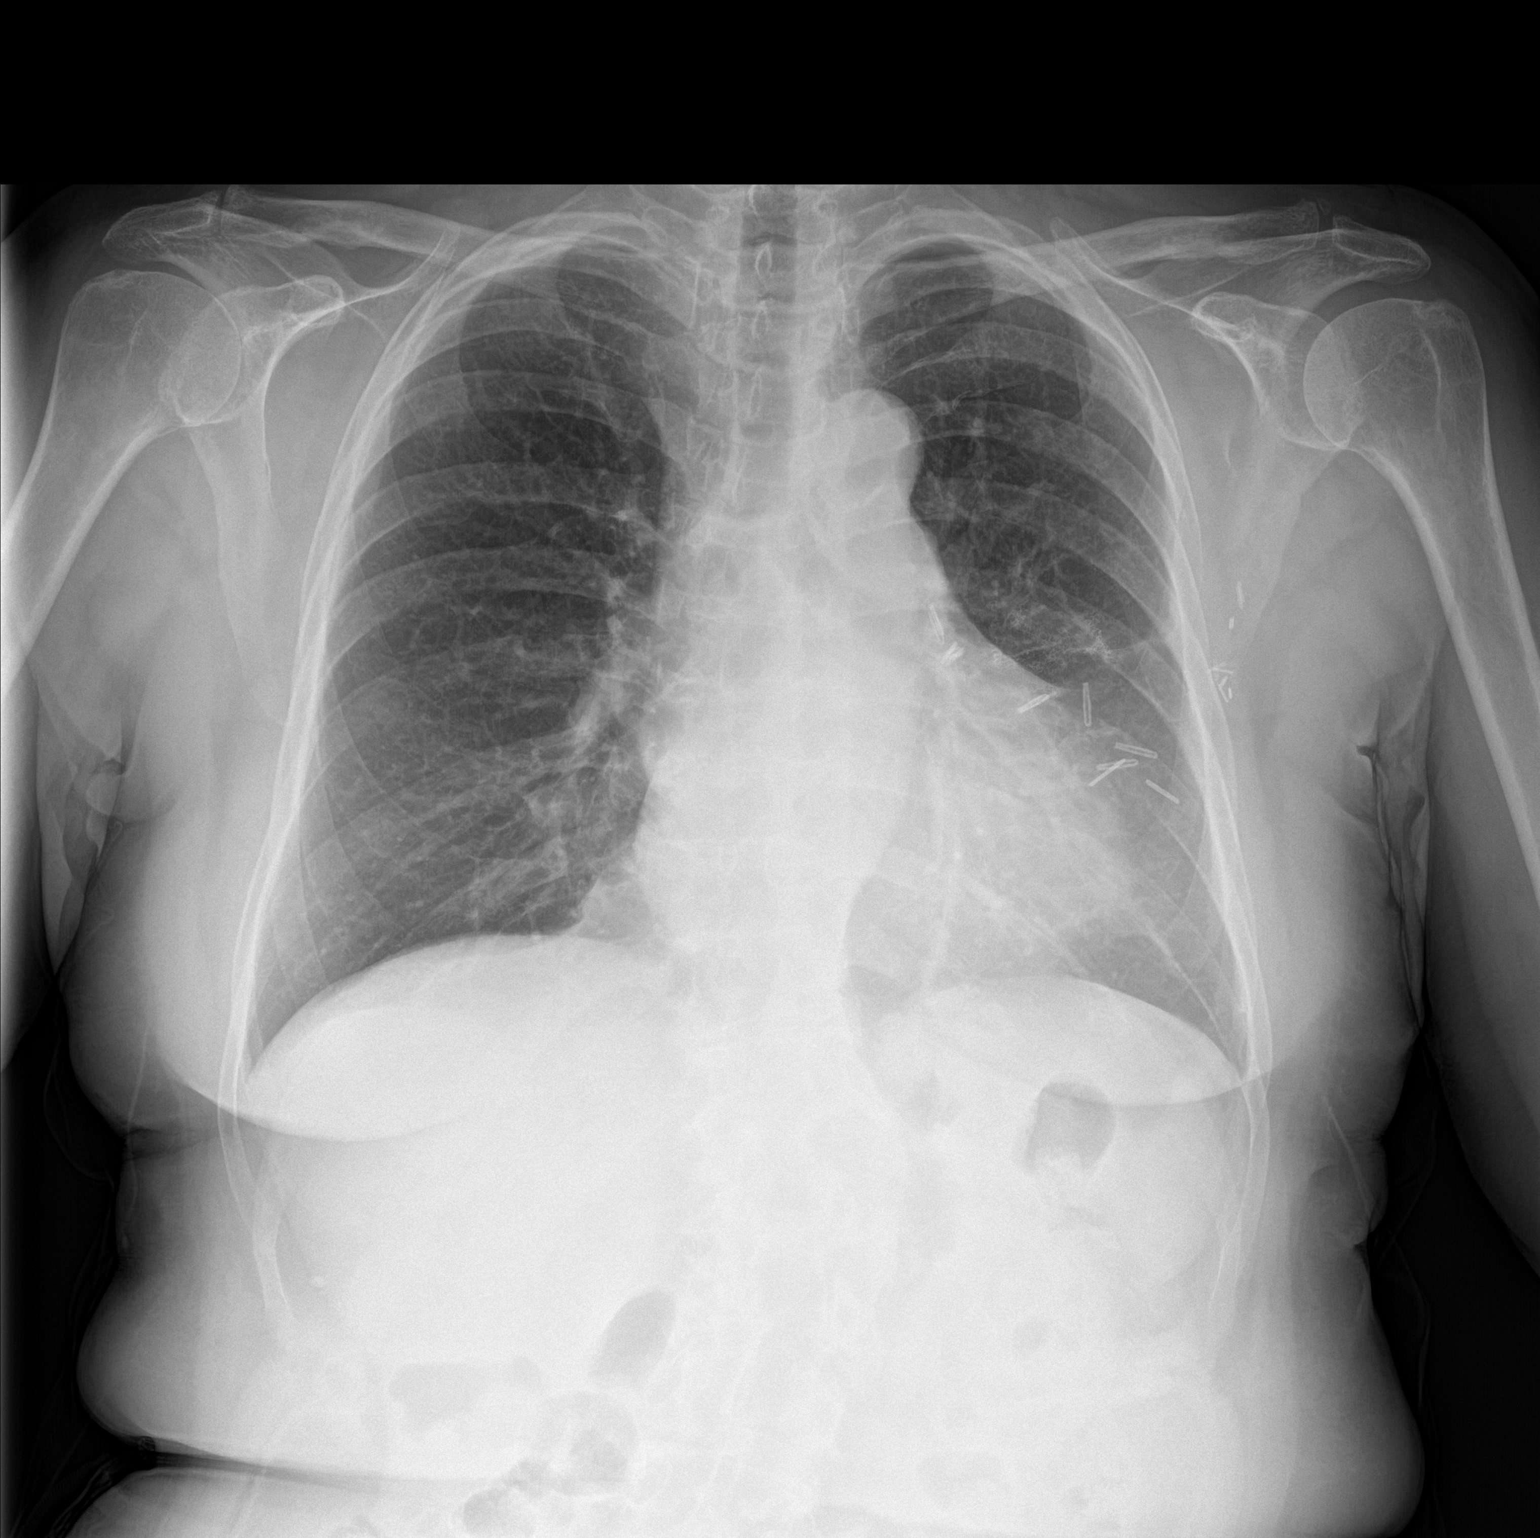

[chest lat]
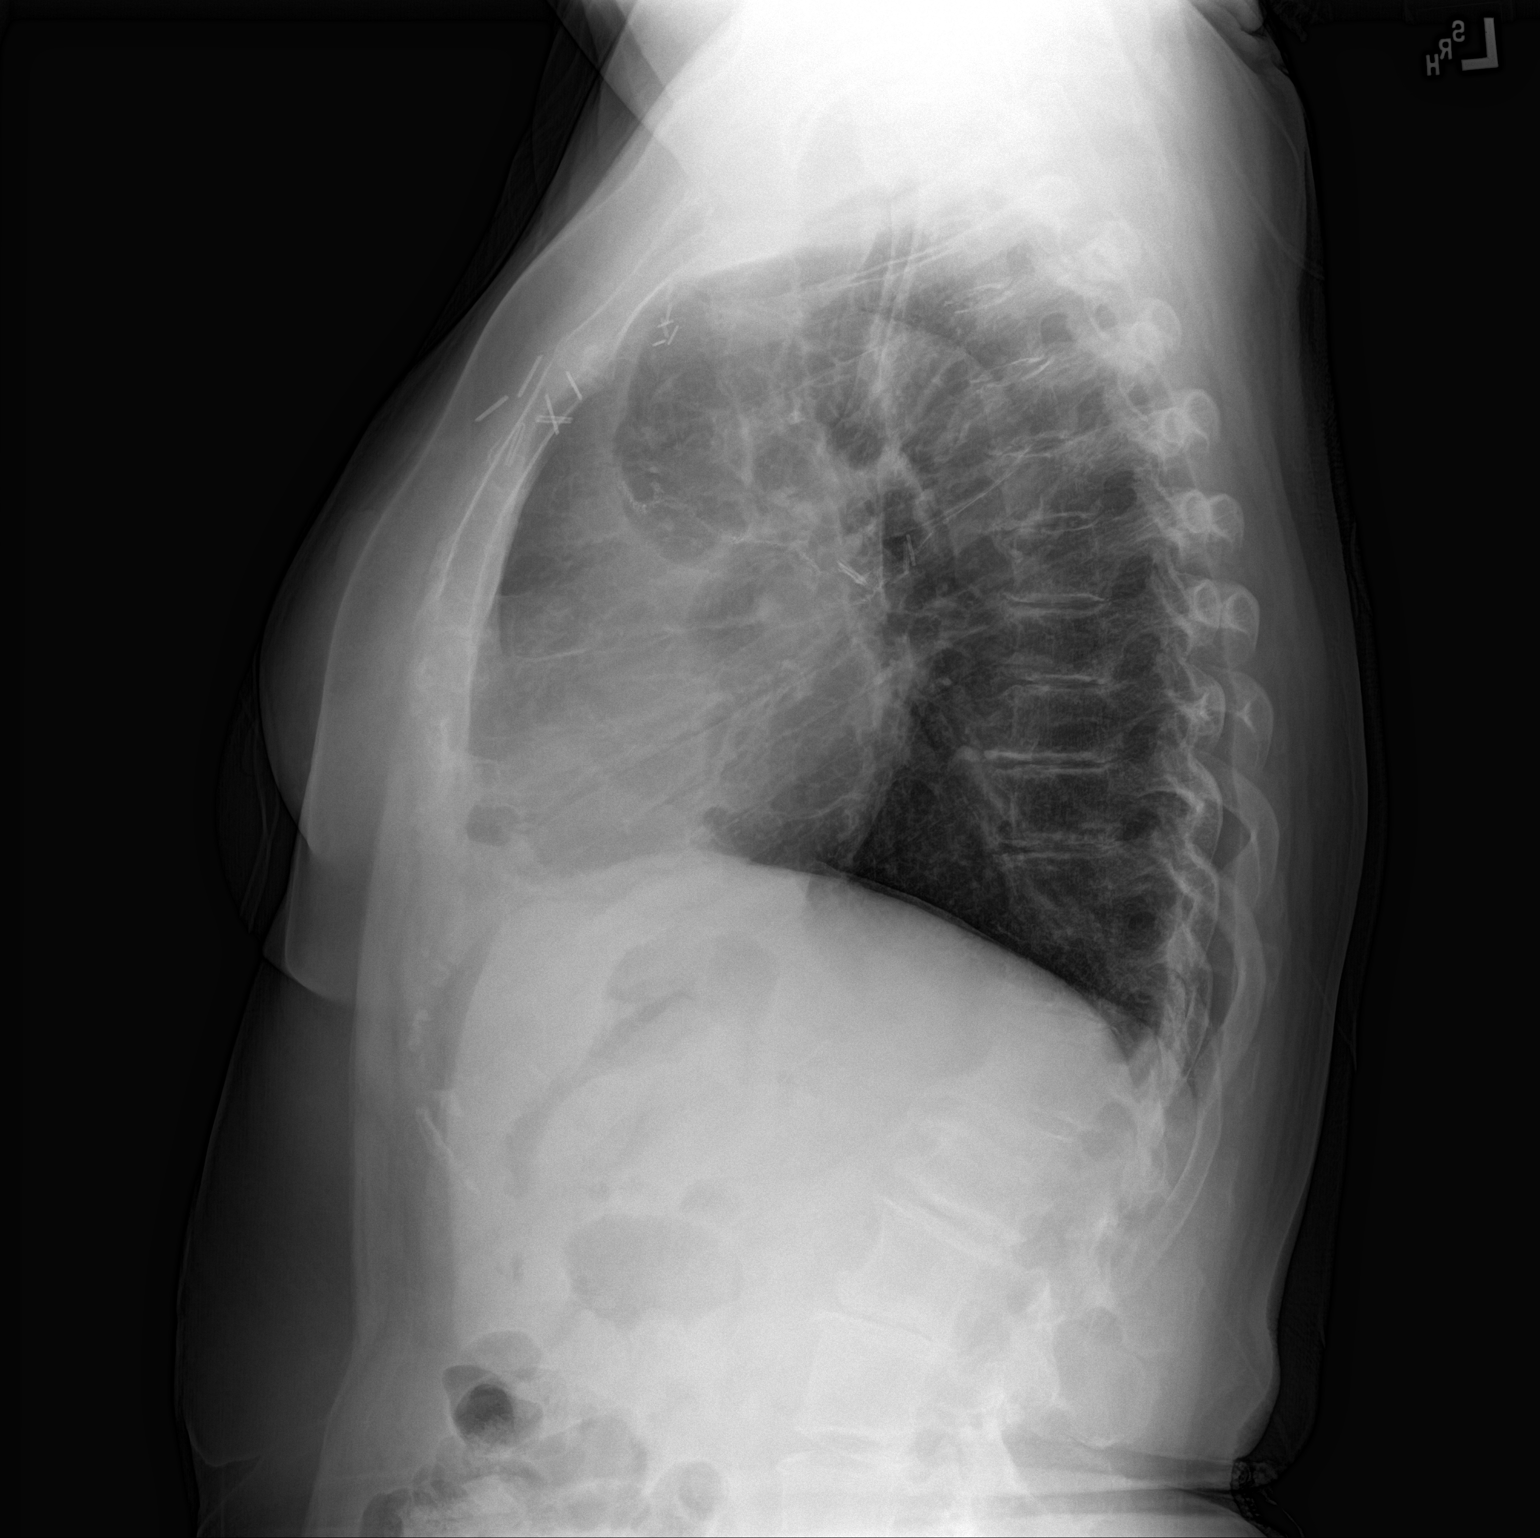

[2 of 2 positions shown; findings below may reference images not displayed]

FINDINGS: The lungs are adequately inflated. There is no pleural effusion or
pneumothorax. There is no alveolar infiltrate. The pulmonary
interstitium is normal. There are postsurgical changes on the left.
The heart is normal in size. The pulmonary vascularity is not
engorged. The bony thorax exhibits no acute abnormality.
IMPRESSION: There is no evidence of CHF or pneumonia. There is underlying
reactive airway disease. Postsurgical changes in the left lung are
stable.

## 2016-12-01 MED ORDER — FLUTICASONE-SALMETEROL 230-21 MCG/ACT IN AERO: INHALATION_SPRAY | RESPIRATORY_TRACT | 1 refills | Status: DC

## 2016-12-01 MED ORDER — GABAPENTIN 400 MG PO CAPS: 400.0000 mg | ORAL_CAPSULE | Freq: Two times a day (BID) | ORAL | 0 refills | Status: DC

## 2016-12-01 MED ORDER — OMEPRAZOLE 20 MG PO CPDR: DELAYED_RELEASE_CAPSULE | ORAL | 0 refills | Status: DC

## 2016-12-01 NOTE — Telephone Encounter (Signed)
Patient is requesting a refill of her omeprazole, gabapentin, and Advair inhaler. Sent to [facility], and needs to be a 90 day supply for insurance.

## 2016-12-01 NOTE — Addendum Note (Signed)
Addended by: Fonnie Mu on: 12/01/2016 03:14 PM   Modules accepted: Orders

## 2016-12-02 ENCOUNTER — Ambulatory Visit (INDEPENDENT_AMBULATORY_CARE_PROVIDER_SITE_OTHER): Payer: Medicare Other | Admitting: Cardiology

## 2016-12-02 ENCOUNTER — Encounter: Payer: Self-pay | Admitting: Cardiology

## 2016-12-02 VITALS — BP 116/64 | HR 86 | Ht 59.0 in | Wt 133.0 lb

## 2016-12-02 DIAGNOSIS — I5032 Chronic diastolic (congestive) heart failure: Secondary | ICD-10-CM

## 2016-12-02 DIAGNOSIS — I10 Essential (primary) hypertension: Secondary | ICD-10-CM | POA: Diagnosis not present

## 2016-12-02 DIAGNOSIS — I481 Persistent atrial fibrillation: Secondary | ICD-10-CM | POA: Diagnosis not present

## 2016-12-02 DIAGNOSIS — I6389 Other cerebral infarction: Secondary | ICD-10-CM

## 2016-12-02 DIAGNOSIS — I351 Nonrheumatic aortic (valve) insufficiency: Secondary | ICD-10-CM

## 2016-12-02 DIAGNOSIS — I4819 Other persistent atrial fibrillation: Secondary | ICD-10-CM

## 2016-12-02 NOTE — Progress Notes (Signed)
Cardiology Office Note:    Date:  12/02/2016   ID:  Patricia [name], DOB December 08, 1940, MRN [id number]  PCP:  Mellody Dance, DO  Cardiologist:  Fransico Him, MD   Referring MD: Mellody Dance, DO   Chief Complaint  Patient presents with  . Hypertension  . Congestive Heart Failure  . Aortic Insuffiency  . Atrial Fibrillation    History of Present Illness:    Patricia [name] is a 76 y.o. female with a hx of history of lung CA s/p resection (2003), breast CA s/p lumpectomy/Tamoxifen/XRT, GERD, hypothyroidism, HTN and HLD.Marland Kitchen She has a history chest pain (normal nuclear stress test), chronic diastolic CHF, mild to moderate AR and persistent afib in setting of CVA. She underwent DCCV to NSR on 06/24/2015. I saw her back in followup and she was back in atrial fibrillation. She was started on Amio 200mg  BID but this was subsequently stopped due to prolonged QTc. Nuclear stress test showed no ischemia 04/2015.She is followed by Dr. Chase Caller for SOB which has now resolved and thought to be due to deconditioning.  She is here today for followup and is doing well.  She denies any chest pain or pressure, SOB, DOE, PND, orthopnea, LE edema, dizziness, palpitations or syncope. She is compliant with her meds and is tolerating meds with no SE.    Past Medical History:  Diagnosis Date  . Allergic rhinitis, cause unspecified   . Aortic regurgitation 06/17/2015   Mild to moderate AR by echo 04/2015  . 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 Chronic cough   . Chronic diastolic heart failure ([place]) 06/17/2015  . CKD (chronic kidney disease), stage III ([place])   . Cough variant asthma   . Disease of pharynx or nasopharynx   . Diverticulosis   . Essential hypertension   . GERD (gastroesophageal reflux disease)   . Glaucoma   . Hiatal hernia   . Hyperlipidemia   . Internal hemorrhoids   . Laryngospasm   . Multinodular goiter   . Osteopenia   .  Persistent atrial fibrillation ([place])    a. Dx 04/2015 at time of stroke. b. DCCV 06/2015 - did not hold. Amio started then stopped due to QT prolongation.  Marland Kitchen Postmenopausal   . Radiation 10/22/14-11/23/14   Left Breast  . Stricture and stenosis of cervix     Past Surgical History:  Procedure Laterality Date  . BREAST SURGERY    . CARDIOVERSION N/A 06/24/2015   Procedure: CARDIOVERSION;  Surgeon: Sueanne Margarita, MD;  Location: [place];  Service: Cardiovascular;  Laterality: N/A;  . CARDIOVERSION N/A 05/19/2016   Procedure: CARDIOVERSION;  Surgeon: Sueanne Margarita, MD;  [facility];  Service: Cardiovascular;  Laterality: N/A;  . COLONOSCOPY    . LUNG CANCER SURGERY  2003   resection carcinoid lingula-lt upper lobe  . RADIOACTIVE SEED GUIDED MASTECTOMY WITH AXILLARY SENTINEL LYMPH NODE BIOPSY Left 06/26/2014   Procedure: RADIOACTIVE SEED GUIDED PARTIAL MASTECTOMY WITH AXILLARY SENTINEL LYMPH NODE BIOPSY;  Surgeon: Stark Klein, MD;  Location: [place];  Service: General;  Laterality: Left;  . RE-EXCISION OF BREAST LUMPECTOMY Left 08/07/2014   Procedure: RE-EXCISION OF LEFT BREAST LUMPECTOMY;  Surgeon: Stark Klein, MD;  Location: [place];  Service: General;  Laterality: Left;  . RIGHT HEART CATH N/A 04/27/2016   Procedure: Right Heart Cath;  Surgeon: Larey Dresser, MD;  Location: [facility]  INVASIVE CV LAB;  Service: Cardiovascular;  Laterality: N/A;    Current Medications: Current Meds  Medication Sig  . albuterol (PROVENTIL HFA;VENTOLIN HFA) 108 (90 Base) MCG/ACT inhaler Inhale 2 puffs into the lungs every 6 (six) hours as needed for wheezing or shortness of breath.  . anastrozole (ARIMIDEX) 1 MG tablet Take 1 tablet (1 mg total) by mouth daily.  Marland Kitchen atorvastatin (LIPITOR) 40 MG tablet TAKE 1 TABLET DAILY  . digoxin (LANOXIN) 0.125 MG tablet TAKE 1 TABLET DAILY  . diltiazem (CARDIZEM CD) 180 MG 24 hr capsule TAKE 1 CAPSULE DAILY  . ELIQUIS 5 MG TABS  tablet TAKE 1 TABLET TWICE A DAY  . fluticasone-salmeterol (ADVAIR HFA) 230-21 MCG/ACT inhaler USE 2 INHALATIONS TWICE A DAY  . furosemide (LASIX) 20 MG tablet TAKE 2 TABLETS EVERY MORNING AND 1 TABLET EVERY EVENING  . gabapentin (NEURONTIN) 400 MG capsule Take 1 capsule (400 mg total) by mouth 2 (two) times daily.  Marland Kitchen loratadine (CLARITIN) 10 MG tablet Take 10 mg by mouth daily as needed for allergies. Reported on 09/16/2015  . Multiple Vitamins-Iron (MULTIVITAMIN/IRON) TABS Take 1 tablet by mouth daily.   Marland Kitchen omeprazole (PRILOSEC) 20 MG capsule TAKE 1 CAPSULE DAILY 30 MINUTES BEFORE A MEAL  . potassium chloride SA (K-DUR,KLOR-CON) 20 MEQ tablet Take 1 tablet (20 mEq total) by mouth 2 (two) times daily.  Marland Kitchen thyroid (ARMOUR THYROID) 60 MG tablet Take 1 tablet (60 mg total) by mouth daily before breakfast.     Allergies:   Combigan [brimonidine tartrate-timolol]; Other; Sulfa antibiotics; and Sulfamethoxazole-trimethoprim   Social History   Social History  . Marital status: Married    Spouse name: N/A  . Number of children: N/A  . Years of education: N/A   Social History Main Topics  . Smoking status: Former Smoker    Packs/day: 0.75    Years: 5.00    Quit date: 06/14/1961  . Smokeless tobacco: Never Used  . Alcohol use 0.0 oz/week     Comment: occasional wine  . Drug use: No  . Sexual activity: Not Currently   Other Topics Concern  . None   Social History Narrative   Retired. Lives with husband.      Family History: The patient's family history includes Atrial fibrillation in her son; Hyperlipidemia in her father and mother; Hypertension in her father and mother; Stroke in her father and mother; Transient ischemic attack in her father. There is no history of Heart attack.  ROS:   Please see the history of present illness.    ROS  All other systems reviewed and negative.   EKGs/Labs/Other Studies Reviewed:    The following studies were reviewed today: none  EKG:  EKG is   ordered today.  The ekg ordered today demonstrates atrial fibrillation with CVR at 86bpm with ST/T wave abnormality inferolaterally  Recent Labs: 09/22/2016: Platelets 295 11/05/2016: ALT 21; BUN 18; Creatinine, Ser 0.98; Hemoglobin 14.8; Potassium 4.2; Sodium 143; TSH 1.540   Recent Lipid Panel    Component Value Date/Time   CHOL 136 11/05/2016 0831   TRIG 116 11/05/2016 0831   TRIG 52 01/13/2006 1012   HDL 43 11/05/2016 0831   CHOLHDL 3.2 11/05/2016 0831   CHOLHDL 3 04/01/2016 0952   VLDL 19.2 04/01/2016 0952   LDLCALC 70 11/05/2016 0831   LDLDIRECT 174.3 06/04/2009 0000    Physical Exam:    VS:  BP 116/64   Pulse 86   Ht 4\' 11"  (1.499 m)   Wt  133 lb (60.3 kg)   SpO2 96%   BMI 26.86 kg/m     Wt Readings from Last 3 Encounters:  12/02/16 133 lb (60.3 kg)  11/19/16 131 lb 9.6 oz (59.7 kg)  09/22/16 136 lb 8 oz (61.9 kg)     GEN:  Well nourished, well developed in no acute distress HEENT: Normal NECK: No JVD; No carotid bruits LYMPHATICS: No lymphadenopathy CARDIAC: RRR, no rubs, gallops.  1/6 SM at RUSB  RESPIRATORY:  Clear to auscultation without rales, wheezing or rhonchi  ABDOMEN: Soft, non-tender, non-distended MUSCULOSKELETAL:  No edema; No deformity  SKIN: Warm and dry NEUROLOGIC:  Alert and oriented x 3 PSYCHIATRIC:  Normal affect   ASSESSMENT:    1. Chronic diastolic heart failure ([place])   2. Nonrheumatic aortic valve insufficiency   3. Essential hypertension   4. Persistent atrial fibrillation ([facility])    PLAN:    In order of problems listed above:  1.  Chronic diastolic CHF - she appears euvolemic on exam today and weight is stable.  She will continue on Lasix 40mg  daily.    2.  Mild AR by echo 04/2016.    3.  HTN - BP is well controlled on current meds.  She will continue on Cardizem CD 180mg  daily  4.  Persistent atrial fibrillation - she is back in afib after DCCV.  Her LA is severely dilated so not a candidate for ablation.  She did not tolerate  amio due to prolonged QT so likely would happen with other Class III AAD.  She is completely asymptomatic and so we discussed rate vs. Rhythm control.  She has opted for rate control at this time.  Will continue on Cardizem CD and Eliquis 5mg  BID for CHADS2VASC score of 5.  Creatinine was normal at 0.98 last month and Hbg 14.8.  5.  Pulmonary HTN - RHC with no significant pulmonary HTN with mean PAP 43mmHg and PCWP elevated at 38mmHg in the setting of afib.     Medication Adjustments/Labs and Tests Ordered: Current medicines are reviewed at length with the patient today.  Concerns regarding medicines are outlined above.  No orders of the defined types were placed in this encounter.  No orders of the defined types were placed in this encounter.   Signed, Fransico Him, MD  12/02/2016 1:51 PM    [place]

## 2016-12-02 NOTE — Patient Instructions (Signed)
Medication Instructions:  Your physician recommends that you continue on your current medications as directed. Please refer to the Current Medication list given to you today.   Labwork: None   Testing/Procedures: None  Follow-Up: Your physician wants you to follow-up in: 6 months with Dr Radford Pax. (April 2019).  You will receive a reminder letter in the mail two months in advance. If you don't receive a letter, please call our office to schedule the follow-up appointment.        If you need a refill on your cardiac medications before your next appointment, please call your pharmacy.

## 2016-12-30 NOTE — Progress Notes (Signed)
Personally  participated in, made any corrections needed, and agree with history, physical, neuro exam,assessment and plan as stated above.    Antonia Ahern, MD [facility] 

## 2017-01-06 ENCOUNTER — Ambulatory Visit (INDEPENDENT_AMBULATORY_CARE_PROVIDER_SITE_OTHER): Payer: Medicare Other | Admitting: Family Medicine

## 2017-01-06 ENCOUNTER — Encounter: Payer: Self-pay | Admitting: Family Medicine

## 2017-01-06 VITALS — BP 119/76 | HR 77 | Ht 59.0 in | Wt 129.9 lb

## 2017-01-06 DIAGNOSIS — I6389 Other cerebral infarction: Secondary | ICD-10-CM | POA: Diagnosis not present

## 2017-01-06 DIAGNOSIS — R7303 Prediabetes: Secondary | ICD-10-CM

## 2017-01-06 DIAGNOSIS — Z719 Counseling, unspecified: Secondary | ICD-10-CM | POA: Diagnosis not present

## 2017-01-06 DIAGNOSIS — Z0000 Encounter for general adult medical examination without abnormal findings: Secondary | ICD-10-CM

## 2017-01-06 DIAGNOSIS — R944 Abnormal results of kidney function studies: Secondary | ICD-10-CM | POA: Diagnosis not present

## 2017-01-06 DIAGNOSIS — E559 Vitamin D deficiency, unspecified: Secondary | ICD-10-CM | POA: Diagnosis not present

## 2017-01-06 DIAGNOSIS — Z13820 Encounter for screening for osteoporosis: Secondary | ICD-10-CM

## 2017-01-06 DIAGNOSIS — M810 Age-related osteoporosis without current pathological fracture: Secondary | ICD-10-CM | POA: Insufficient documentation

## 2017-01-06 MED ORDER — VITAMIN D (ERGOCALCIFEROL) 1.25 MG (50000 UNIT) PO CAPS: 50000.0000 [IU] | ORAL_CAPSULE | ORAL | 10 refills | Status: DC

## 2017-01-06 NOTE — Progress Notes (Signed)
Impression and Recommendations:    1. Encounter for wellness examination   2. Health education/counseling   3. Prediabetes   4. Vitamin D deficiency   5. Postmenopausal bone loss   6. Osteoporosis screening   7. Decreased calculated GFR    -A lso discussed with patient her recent lab work.  It was news to her that she is now a prediabetic.  Also it was news to her that her GFR is decreased and she is stage III chronic kidney disease.  We did extensive counseling regarding this.  Handouts were provided in addition to the counseling done. -Vitamin D deficiency-patient preferred once weekly dosing of ergocalciferol.  Recheck in 4 months or so. -Last bone density was done  5 years ago.   I ordered another one for patient  Please see orders section below for further details of actions taken during this office visit.  Gross side effects, risk and benefits, and alternatives of medications discussed with patient.  Patient is aware that all medications have potential side effects and we are unable to predict every side effect or drug-drug interaction that may occur.  Expresses verbal understanding and consents to current therapy plan and treatment regiment.  1) Anticipatory Guidance: Discussed importance of wearing a seatbelt while driving, not texting while driving; sunscreen when outside along with yearly skin surveillance; eating a well balanced and modest diet; physical activity at least 25 minutes per day or 150 min/ week of moderate to intense activity.  2) Immunizations / Screenings / Labs:  All immunizations and screenings that patient agrees to, are up-to-date per recommendations or will be updated today.  Patient understands the needs for q 66mo dental and yearly vision screens which pt will schedule independently. Obtain CBC, CMP, HgA1c, Lipid panel, TSH and vit D when fasting if not already done recently.   3) Weight:   Discussed goal of losing even 5-10% of current body weight  which would improve overall feelings of well being and improve objective health data significantly.   Improve nutrient density of diet through increasing intake of fruits and vegetables and decreasing saturated/trans fats, white flour products and refined sugar products.   F-up preventative CPE in 1 year. F/up sooner for chronic care management as discussed and/or prn.   No problem-specific Assessment & Plan notes found for this encounter.    Orders Placed This Encounter  Procedures  . Hemoglobin A1c     Meds ordered this encounter  Medications  . DISCONTD: Vitamin D, Ergocalciferol, (DRISDOL) 50000 units CAPS capsule    Sig: Take 1 capsule (50,000 Units total) every 7 (seven) days by mouth.    Dispense:  12 capsule    Refill:  10     Discontinued Medications   ANASTROZOLE (ARIMIDEX) 1 MG TABLET    Take 1 tablet (1 mg total) by mouth daily.   ATORVASTATIN (LIPITOR) 40 MG TABLET    TAKE 1 TABLET DAILY   DILTIAZEM (CARDIZEM CD) 180 MG 24 HR CAPSULE    TAKE 1 CAPSULE DAILY   GABAPENTIN (NEURONTIN) 400 MG CAPSULE    Take 1 capsule (400 mg total) by mouth 2 (two) times daily.   OMEPRAZOLE (PRILOSEC) 20 MG CAPSULE    TAKE 1 CAPSULE DAILY 30 MINUTES BEFORE A MEAL   POTASSIUM CHLORIDE SA (K-DUR,KLOR-CON) 20 MEQ TABLET    Take 1 tablet (20 mEq total) by mouth 2 (two) times daily.   THYROID (ARMOUR THYROID) 60 MG TABLET    Take 1  tablet (60 mg total) by mouth daily before breakfast.      Please see orders placed and AVS handed out to patient at the end of our visit for further patient instructions/ counseling done pertaining to today's office visit.     Subjective:   No chief complaint on file.  CC: physical  HPI: Kagan Hietpas is a 76 y.o. female who presents to [facility] [facility] today a yearly health maintenance exam.  Health Maintenance Summary Reviewed and updated, unless pt declines services.  --> moved here 15-20 yrs from [facility]. No derm eval  since.    Aspirin: administering 81 mg daily Colonoscopy:      Last performed on 03/16/2017 only found internal hemorrhoids and diverticulosis.  Was told to repeat in 10 years per Dr. Lucio Edward on March 16, 2017. Tobacco History Reviewed:   Y -  smoked btwn [age] yo-  Socially.  Alcohol:    No concerns, no excessive use Exercise Habits:   Not meeting AHA guidelines STD concerns:   none Drug Use:   None Eye exam:   Glaucoma- within last year.  Dental exam- within past 3 mo-->   Every 6-15mo.  Birth control method:   n/a Menses regular:     n/a Lumps or breast concerns:       Breast Cancer Family History:      No Bone/ DEXA scan:    See health records  Health Maintenance  Topic Date Due  . MAMMOGRAM  07/23/2018  . TETANUS/TDAP  12/30/2022  . INFLUENZA VACCINE  Completed  . DEXA SCAN  Completed  . PNA vac Low Risk Adult  Completed      Depression screen [facility] 2/9 04/18/2018 02/08/2018 12/14/2017 09/27/2017 05/27/2017  Decreased Interest 0 0 0 0 0  Down, Depressed, Hopeless 0 0 0 0 0  PHQ - 2 Score 0 0 0 0 0  Altered sleeping 0 0 0 0 0  Tired, decreased energy 0 0 0 0 0  Change in appetite 0 0 0 0 0  Feeling bad or failure about yourself  0 0 0 0 0  Trouble concentrating 0 0 0 0 0  Moving slowly or fidgety/restless 0 0 0 0 0  Suicidal thoughts 0 0 0 0 0  PHQ-9 Score 0 0 0 0 0  Difficult doing work/chores Not difficult at all Not difficult at all Not difficult at all - Not difficult at all  Some recent data might be hidden     Health Maintenance  Topic Date Due  . MAMMOGRAM  07/23/2018  . TETANUS/TDAP  12/30/2022  . INFLUENZA VACCINE  Completed  . DEXA SCAN  Completed  . PNA vac Low Risk Adult  Completed     Wt Readings from Last 3 Encounters:  04/18/18 134 lb 4.8 oz (60.9 kg)  02/08/18 130 lb 9.6 oz (59.2 kg)  12/14/17 131 lb 9.6 oz (59.7 kg)   BP Readings from Last 3 Encounters:  04/18/18 122/82  02/08/18 120/82  12/14/17 109/76   Pulse Readings from Last  3 Encounters:  04/18/18 82  02/08/18 69  12/14/17 76     Past Medical History:  Diagnosis Date  . Allergic rhinitis, cause unspecified   . Aortic regurgitation 06/17/2015   Mild to moderate AR by echo 04/2015  . 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 Chronic cough   .  Chronic diastolic heart failure ([place]) 06/17/2015  . CKD (chronic kidney disease), stage III ([place])   . Cough variant asthma   . Disease of pharynx or nasopharynx   . Diverticulosis   . Essential hypertension   . GERD (gastroesophageal reflux disease)   . Glaucoma   . Hiatal hernia   . Hyperlipidemia   . Internal hemorrhoids   . Laryngospasm   . Multinodular goiter   . Osteopenia   . Persistent atrial fibrillation    a. Dx 04/2015 at time of stroke. b. DCCV 06/2015 - did not hold. Amio started then stopped due to QT prolongation.  Marland Kitchen Postmenopausal   . Radiation 10/22/14-11/23/14   Left Breast  . Stricture and stenosis of cervix       Past Surgical History:  Procedure Laterality Date  . BREAST SURGERY    . CARDIOVERSION N/A 06/24/2015   Procedure: CARDIOVERSION;  Surgeon: Sueanne Margarita, MD;  Location: [place];  Service: Cardiovascular;  Laterality: N/A;  . CARDIOVERSION N/A 05/19/2016   Procedure: CARDIOVERSION;  Surgeon: Sueanne Margarita, MD;  [facility];  Service: Cardiovascular;  Laterality: N/A;  . COLONOSCOPY    . LUNG CANCER SURGERY  2003   resection carcinoid lingula-lt upper lobe  . RADIOACTIVE SEED GUIDED PARTIAL MASTECTOMY WITH AXILLARY SENTINEL LYMPH NODE BIOPSY Left 06/26/2014   Procedure: RADIOACTIVE SEED GUIDED PARTIAL MASTECTOMY WITH AXILLARY SENTINEL LYMPH NODE BIOPSY;  Surgeon: Stark Klein, MD;  Location: [place];  Service: General;  Laterality: Left;  . RE-EXCISION OF BREAST LUMPECTOMY Left 08/07/2014   Procedure: RE-EXCISION OF LEFT BREAST LUMPECTOMY;  Surgeon: Stark Klein, MD;  Location: [place];  Service: General;  Laterality: Left;  . RIGHT HEART CATH N/A 04/27/2016   Procedure: Right Heart Cath;  Surgeon: Larey Dresser, MD;  Location: [place] CV LAB;  Service: Cardiovascular;  Laterality: N/A;      Family History  Problem Relation Age of Onset  . Stroke Mother   . Hypertension Mother   . Hyperlipidemia Mother   . Stroke Father   . Transient ischemic attack Father   . Hyperlipidemia Father   . Hypertension Father   . Atrial fibrillation Son   . Heart attack Neg Hx       Social History   Substance and Sexual Activity  Drug Use No  ,   Social History   Substance and Sexual Activity  Alcohol Use Yes  . Alcohol/week: 0.0 standard drinks   Comment: occasional wine  ,   Social History   Tobacco Use  Smoking Status Former Smoker  . Packs/day: 0.75  . Years: 5.00  . Pack years: 3.75  . Last attempt to quit: 06/14/1961  . Years since quitting: 56.9  Smokeless Tobacco Never Used  ,   Social History   Substance and Sexual Activity  Sexual Activity Not Currently    Current Outpatient Medications on File Prior to Visit  Medication Sig Dispense Refill  . albuterol (PROVENTIL HFA;VENTOLIN HFA) 108 (90 Base) MCG/ACT inhaler Inhale 2 puffs into the lungs every 6 (six) hours as needed for wheezing or shortness of breath.    . loratadine (CLARITIN) 10 MG tablet Take 10 mg by mouth daily as needed for allergies. Reported on 09/16/2015    . Multiple Vitamins-Iron (MULTIVITAMIN/IRON) TABS Take 1 tablet by mouth daily.      Current Facility-Administered Medications on File Prior to Visit  Medication Dose Route Frequency Provider Last Rate Last Dose  .  denosumab (PROLIA) injection 60 mg  60 mg Subcutaneous Once Magrinat, Virgie Dad, MD        Allergies: Combigan [brimonidine tartrate-timolol]; Other; Sulfa antibiotics; and Sulfamethoxazole-trimethoprim  Review of Systems: General:   Denies fever, chills, unexplained weight loss.  Optho/Auditory:    Denies visual changes, blurred vision/LOV Respiratory:   Denies SOB, DOE more than baseline levels.  Cardiovascular:   Denies chest pain, palpitations, new onset peripheral edema  Gastrointestinal:   Denies nausea, vomiting, diarrhea.  Genitourinary: Denies dysuria, freq/ urgency, flank pain or discharge from genitals.  Endocrine:     Denies hot or cold intolerance, polyuria, polydipsia. Musculoskeletal:   Denies unexplained myalgias, joint swelling, unexplained arthralgias, gait problems.  Skin:  Denies rash, suspicious lesions Neurological:     Denies dizziness, unexplained weakness, numbness  Psychiatric/Behavioral:   Denies mood changes, suicidal or homicidal ideations, hallucinations    Objective:    Blood pressure 119/76, pulse 77, height 4\' 11"  (1.499 m), weight 129 lb 14.4 oz (58.9 kg). Body mass index is 26.24 kg/m. General Appearance:    Alert, cooperative, no distress, appears stated age  Head:    Normocephalic, without obvious abnormality, atraumatic  Eyes:    PERRL, conjunctiva/corneas clear, EOM's intact, fundi    benign, both eyes  Ears:    Normal TM's and external ear canals, both ears  Nose:   Nares normal, septum midline, mucosa normal, no drainage    or sinus tenderness  Throat:   Lips w/o lesion, mucosa moist, and tongue normal; teeth and   gums normal  Neck:   Supple, symmetrical, trachea midline, no adenopathy;    thyroid:  no enlargement/tenderness/nodules; no carotid   bruit or JVD  Back:     Symmetric, no curvature, ROM normal, no CVA tenderness  Lungs:     Clear to auscultation bilaterally, respirations unlabored, no       Wh/ R/ R  Chest Wall:    No tenderness or gross deformity; normal excursion   Heart:    Regular rate and rhythm, S1 and S2 normal, no murmur, rub   or gallop  Breast Exam:    No tenderness, masses, or nipple abnormality b/l; no d/c  Abdomen:     Soft, non-tender, bowel sounds active all four quadrants, NO   G/R/R, no masses, no  organomegaly  Genitalia:    Ext genitalia: without lesion, no rash or discharge, No         tenderness;  Cervix: WNL's w/o discharge or lesion;        Adnexa:  No tenderness or palpable masses   Rectal:    Normal tone, no masses or tenderness;   guaiac negative stool  Extremities:   Extremities normal, atraumatic, no cyanosis or gross edema  Pulses:   2+ and symmetric all extremities  Skin:   Warm, dry, Skin color, texture, turgor normal, no obvious rashes or lesions Psych: No HI/SI, judgement and insight good, Euthymic mood. Full Affect.  Neurologic:   CNII-XII intact, normal strength, sensation and reflexes    Throughout   DEXA:    [email or web address]   Based on the [place] FRAX tool, a 76 year old white woman with no other risk factors has a 9.3% 10-year risk for any osteoporotic fracture. White women between the ages of 47 and 45 years with equivalent or greater 10-year fracture risks based on specific risk factors include but are not limited to the following persons: 26) a 76 year old current smoker with a BMI less than 21 kg/m2, daily  alcohol use, and parental fracture history; 31) a 76 year old woman with a parental fracture history; 46) a 76 year old woman with a BMI less than 21 kg/m2 and daily alcohol use; and 13) a 76 year old current smoker with daily alcohol use.   The FRAX tool also predicts 10-year fracture risks for black, Asian, and Hispanic women in the [place]. In general, estimated fracture risks in nonwhite women are lower than those for white women of the same age. Although the USPSTF recommends using a 9.3% 10-year fracture risk threshold to screen women aged 85 to 16 years,

## 2017-01-06 NOTE — Patient Instructions (Addendum)
-Please locate patient's [place] results,   -I put in referral for you to get a bone density.  If you do not hear anything within a week please let Dorothea Ogle our referral coordinator now.  -You will need a repeat colonoscopy in January 2019 per Dr. Lucio Edward.     Risk factors for prediabetes and type 2 diabetes  Researchers don't fully understand why some people develop prediabetes and type 2 diabetes and others don't.  It's clear that certain factors increase the risk, however, including:  Weight. The more fatty tissue you have, the more resistant your cells become to insulin.  Inactivity. The less active you are, the greater your risk. Physical activity helps you control your weight, uses up glucose as energy and makes your cells more sensitive to insulin.  Family history. Your risk increases if a parent or sibling has type 2 diabetes.  Race. Although it's unclear why, people of certain races - including blacks, Hispanics, American Indians and Asian-Americans - are at higher risk.  Age. Your risk increases as you get older. This may be because you tend to exercise less, lose muscle mass and gain weight as you age. But type 2 diabetes is also increasing dramatically among children, adolescents and younger adults.  Gestational diabetes. If you developed gestational diabetes when you were pregnant, your risk of developing prediabetes and type 2 diabetes later increases. If you gave birth to a baby weighing more than 9 pounds (4 kilograms), you're also at risk of type 2 diabetes.  Polycystic ovary syndrome. For women, having polycystic ovary syndrome - a common condition characterized by irregular menstrual periods, excess hair growth and obesity - increases the risk of diabetes.  High blood pressure. Having blood pressure over 140/90 millimeters of mercury (mm Hg) is linked to an increased risk of type 2 diabetes.  Abnormal cholesterol and triglyceride levels. If you have low levels of  high-density lipoprotein (HDL), or "good," cholesterol, your risk of type 2 diabetes is higher. Triglycerides are another type of fat carried in the blood. People with high levels of triglycerides have an increased risk of type 2 diabetes. Your doctor can let you know what your cholesterol and triglyceride levels are.  A good guide to good carbs: The glycemic index ---If you have diabetes, or at risk for diabetes, you know all too well that when you eat carbohydrates, your blood sugar goes up. The total amount of carbs you consume at a meal or in a snack mostly determines what your blood sugar will do. But the food itself also plays a role. A serving of white rice has almost the same effect as eating pure table sugar - a quick, high spike in blood sugar. A serving of lentils has a slower, smaller effect.  ---Picking good sources of carbs can help you control your blood sugar and your weight. Even if you don't have diabetes, eating healthier carbohydrate-rich foods can help ward off a host of chronic conditions, from heart disease to various cancers to, well, diabetes.  ---One way to choose foods is with the glycemic index (GI). This tool measures how much a food boosts blood sugar.  The glycemic index rates the effect of a specific amount of a food on blood sugar compared with the same amount of pure glucose. A food with a glycemic index of 28 boosts blood sugar only 28% as much as pure glucose. One with a GI of 95 acts like pure glucose.    High glycemic foods result  in a quick spike in insulin and blood sugar (also known as blood glucose).  Low glycemic foods have a slower, smaller effect- these are healthier for you.   Using the glycemic index Using the glycemic index is easy: choose foods in the low GI category instead of those in the high GI category (see below), and go easy on those in between. Low glycemic index (GI of 55 or less): Most fruits and vegetables, beans, minimally processed grains,  pasta, low-fat dairy foods, and nuts.  Moderate glycemic index (GI 56 to 69): White and sweet potatoes, corn, white rice, couscous, breakfast cereals such as Cream of Wheat and Mini Wheats.  High glycemic index (GI of 70 or higher): White bread, rice cakes, most crackers, bagels, cakes, doughnuts, croissants, most packaged breakfast cereals. You can see the values for 100 commons foods and get links to more at www.[email or web address].si.  Swaps for lowering glycemic index  Instead of this high-glycemic index food Eat this lower-glycemic index food  White rice Brown rice or converted rice  Instant oatmeal Steel-cut oats  Cornflakes Bran flakes  Baked potato Pasta, bulgur  White bread Whole-grain bread  Corn Peas or leafy greens       Prediabetes Eating Plan  Prediabetes--also called impaired glucose tolerance or impaired fasting glucose--is a condition that causes blood sugar (blood glucose) levels to be higher than normal. Following a healthy diet can help to keep prediabetes under control. It can also help to lower the risk of type 2 diabetes and heart disease, which are increased in people who have prediabetes. Along with regular exercise, a healthy diet:  Promotes weight loss.  Helps to control blood sugar levels.  Helps to improve the way that the body uses insulin.   WHAT DO I NEED TO KNOW ABOUT THIS EATING PLAN?   Use the glycemic index (GI) to plan your meals. The index tells you how quickly a food will raise your blood sugar. Choose low-GI foods. These foods take a longer time to raise blood sugar.  Pay close attention to the amount of carbohydrates in the food that you eat. Carbohydrates increase blood sugar levels.  Keep track of how many calories you take in. Eating the right amount of calories will help you to achieve a healthy weight. Losing about 7 percent of your starting weight can help to prevent type 2 diabetes.  You may want to follow a Mediterranean  diet. This diet includes a lot of vegetables, lean meats or fish, whole grains, fruits, and healthy oils and fats.   WHAT FOODS CAN I EAT?  Grains Whole grains, such as whole-wheat or whole-grain breads, crackers, cereals, and pasta. Unsweetened oatmeal. Bulgur. Barley. Quinoa. Brown rice. Corn or whole-wheat flour tortillas or taco shells. Vegetables Lettuce. Spinach. Peas. Beets. Cauliflower. Cabbage. Broccoli. Carrots. Tomatoes. Squash. Eggplant. Herbs. Peppers. Onions. Cucumbers. Brussels sprouts. Fruits Berries. Bananas. Apples. Oranges. Grapes. Papaya. Mango. Pomegranate. Kiwi. Grapefruit. Cherries. Meats and Other Protein Sources Seafood. Lean meats, such as chicken and [place] or lean cuts of pork and beef. Tofu. Eggs. Nuts. Beans. Dairy Low-fat or fat-free dairy products, such as yogurt, cottage cheese, and cheese. Beverages Water. Tea. Coffee. Sugar-free or diet soda. Seltzer water. Milk. Milk alternatives, such as soy or almond milk. Condiments Mustard. Relish. Low-fat, low-sugar ketchup. Low-fat, low-sugar barbecue sauce. Low-fat or fat-free mayonnaise. Sweets and Desserts Sugar-free or low-fat pudding. Sugar-free or low-fat ice cream and other frozen treats. Fats and Oils Avocado. Walnuts. Olive oil. The items listed above may  not be a complete list of recommended foods or beverages. Contact your dietitian for more options.    WHAT FOODS ARE NOT RECOMMENDED?  Grains Refined white flour and flour products, such as bread, pasta, snack foods, and cereals. Beverages Sweetened drinks, such as sweet iced tea and soda. Sweets and Desserts Baked goods, such as cake, cupcakes, pastries, cookies, and cheesecake. The items listed above may not be a complete list of foods and beverages to avoid. Contact your dietitian for more information.   This information is not intended to replace advice given to you by your health care provider. Make sure you discuss any questions you have  with your health care provider.   Document Released: 07/03/2014 Document Reviewed: 07/03/2014 Elsevier Interactive Patient Education 2016 [place] for Adults, Female  A healthy lifestyle and preventive care can promote health and wellness. Preventive health guidelines for women include the following key practices.   A routine yearly physical is a good way to check with your health care provider about your health and preventive screening. It is a chance to share any concerns and updates on your health and to receive a thorough exam.   Visit your dentist for a routine exam and preventive care every 6 months. Brush your teeth twice a day and floss once a day. Good oral hygiene prevents tooth decay and gum disease.   The frequency of eye exams is based on your age, health, family medical history, use of contact lenses, and other factors. Follow your health care provider's recommendations for frequency of eye exams.   Eat a healthy diet. Foods like vegetables, fruits, whole grains, low-fat dairy products, and lean protein foods contain the nutrients you need without too many calories. Decrease your intake of foods high in solid fats, added sugars, and salt. Eat the right amount of calories for you.Get information about a proper diet from your health care provider, if necessary.   Regular physical exercise is one of the most important things you can do for your health. Most adults should get at least 150 minutes of moderate-intensity exercise (any activity that increases your heart rate and causes you to sweat) each week. In addition, most adults need muscle-strengthening exercises on 2 or more days a week.   Maintain a healthy weight. The body mass index (BMI) is a screening tool to identify possible weight problems. It provides an estimate of body fat based on height and weight. Your health care provider can find your BMI, and can help you achieve or maintain  a healthy weight.For adults 20 years and older:   - A BMI below 18.5 is considered underweight.   - A BMI of 18.5 to 24.9 is normal.   - A BMI of 25 to 29.9 is considered overweight.   - A BMI of 30 and above is considered obese.   Maintain normal blood lipids and cholesterol levels by exercising and minimizing your intake of trans and saturated fats.  Eat a balanced diet with plenty of fruit and vegetables. Blood tests for lipids and cholesterol should begin at age 4 and be repeated every 5 years minimum.  If your lipid or cholesterol levels are high, you are over 40, or you are at high risk for heart disease, you may need your cholesterol levels checked more frequently.Ongoing high lipid and cholesterol levels should be treated with medicines if diet and exercise are not working.   If you smoke, find out  from your health care provider how to quit. If you do not use tobacco, do not start.   Lung cancer screening is recommended for adults aged 39-80 years who are at high risk for developing lung cancer because of a history of smoking. A yearly low-dose CT scan of the lungs is recommended for people who have at least a 30-pack-year history of smoking and are a current smoker or have quit within the past 15 years. A pack year of smoking is smoking an average of 1 pack of cigarettes a day for 1 year (for example: 1 pack a day for 30 years or 2 packs a day for 15 years). Yearly screening should continue until the smoker has stopped smoking for at least 15 years. Yearly screening should be stopped for people who develop a health problem that would prevent them from having lung cancer treatment.   If you are pregnant, do not drink alcohol. If you are breastfeeding, be very cautious about drinking alcohol. If you are not pregnant and choose to drink alcohol, do not have more than 1 drink per day. One drink is considered to be 12 ounces (355 mL) of beer, 5 ounces (148 mL) of wine, or 1.5 ounces (44 mL) of  liquor.   Avoid use of street drugs. Do not share needles with anyone. Ask for help if you need support or instructions about stopping the use of drugs.   High blood pressure causes heart disease and increases the risk of stroke. Your blood pressure should be checked at least yearly.  Ongoing high blood pressure should be treated with medicines if weight loss and exercise do not work.   If you are 73-52 years old, ask your health care provider if you should take aspirin to prevent strokes.   Diabetes screening involves taking a blood sample to check your fasting blood sugar level. This should be done once every 3 years, after age 97, if you are within normal weight and without risk factors for diabetes. Testing should be considered at a younger age or be carried out more frequently if you are overweight and have at least 1 risk factor for diabetes.   Breast cancer screening is essential preventive care for women. You should practice "breast self-awareness."  This means understanding the normal appearance and feel of your breasts and may include breast self-examination.  Any changes detected, no matter how small, should be reported to a health care provider.  Women in their 70s and 30s should have a clinical breast exam (CBE) by a health care provider as part of a regular health exam every 1 to 3 years.  After age 69, women should have a CBE every year.  Starting at age 65, women should consider having a mammogram (breast X-ray test) every year.  Women who have a family history of breast cancer should talk to their health care provider about genetic screening.  Women at a high risk of breast cancer should talk to their health care providers about having an MRI and a mammogram every year.   -Breast cancer gene (BRCA)-related cancer risk assessment is recommended for women who have family members with BRCA-related cancers. BRCA-related cancers include breast, ovarian, tubal, and peritoneal cancers.  Having family members with these cancers may be associated with an increased risk for harmful changes (mutations) in the breast cancer genes BRCA1 and BRCA2. Results of the assessment will determine the need for genetic counseling and BRCA1 and BRCA2 testing.   The Pap test is  a screening test for cervical cancer. A Pap test can show cell changes on the cervix that might become cervical cancer if left untreated. A Pap test is a procedure in which cells are obtained and examined from the lower end of the uterus (cervix).   - Women should have a Pap test starting at age 63.   - Between ages 23 and 57, Pap tests should be repeated every 2 years.   - Beginning at age 47, you should have a Pap test every 3 years as long as the past 3 Pap tests have been normal.   - Some women have medical problems that increase the chance of getting cervical cancer. Talk to your health care provider about these problems. It is especially important to talk to your health care provider if a new problem develops soon after your last Pap test. In these cases, your health care provider may recommend more frequent screening and Pap tests.   - The above recommendations are the same for women who have or have not gotten the vaccine for human papillomavirus (HPV).   - If you had a hysterectomy for a problem that was not cancer or a condition that could lead to cancer, then you no longer need Pap tests. Even if you no longer need a Pap test, a regular exam is a good idea to make sure no other problems are starting.   - If you are between ages 20 and 31 years, and you have had normal Pap tests going back 10 years, you no longer need Pap tests. Even if you no longer need a Pap test, a regular exam is a good idea to make sure no other problems are starting.   - If you have had past treatment for cervical cancer or a condition that could lead to cancer, you need Pap tests and screening for cancer for at least 20 years after your  treatment.   - If Pap tests have been discontinued, risk factors (such as a new sexual partner) need to be reassessed to determine if screening should be resumed.   - The HPV test is an additional test that may be used for cervical cancer screening. The HPV test looks for the virus that can cause the cell changes on the cervix. The cells collected during the Pap test can be tested for HPV. The HPV test could be used to screen women aged 38 years and older, and should be used in women of any age who have unclear Pap test results. After the age of 69, women should have HPV testing at the same frequency as a Pap test.   Colorectal cancer can be detected and often prevented. Most routine colorectal cancer screening begins at the age of 95 years and continues through age 60 years. However, your health care provider may recommend screening at an earlier age if you have risk factors for colon cancer. On a yearly basis, your health care provider may provide home test kits to check for hidden blood in the stool.  Use of a small camera at the end of a tube, to directly examine the colon (sigmoidoscopy or colonoscopy), can detect the earliest forms of colorectal cancer. Talk to your health care provider about this at age 46, when routine screening begins. Direct exam of the colon should be repeated every 5 -10 years through age 75 years, unless early forms of pre-cancerous polyps or small growths are found.   People who are at an increased risk for  hepatitis B should be screened for this virus. You are considered at high risk for hepatitis B if:  -You were born in a country where hepatitis B occurs often. Talk with your health care provider about which countries are considered high risk.  - Your parents were born in a high-risk country and you have not received a shot to protect against hepatitis B (hepatitis B vaccine).  - You have HIV or AIDS.  - You use needles to inject street drugs.  - You live with, or  have sex with, someone who has Hepatitis B.  - You get hemodialysis treatment.  - You take certain medicines for conditions like cancer, organ transplantation, and autoimmune conditions.   Hepatitis C blood testing is recommended for all people born from [birth year] through 1965 and any individual with known risks for hepatitis C.   Practice safe sex. Use condoms and avoid high-risk sexual practices to reduce the spread of sexually transmitted infections (STIs). STIs include gonorrhea, chlamydia, syphilis, trichomonas, herpes, HPV, and human immunodeficiency virus (HIV). Herpes, HIV, and HPV are viral illnesses that have no cure. They can result in disability, cancer, and death. Sexually active women aged 43 years and younger should be checked for chlamydia. Older women with new or multiple partners should also be tested for chlamydia. Testing for other STIs is recommended if you are sexually active and at increased risk.   Osteoporosis is a disease in which the bones lose minerals and strength with aging. This can result in serious bone fractures or breaks. The risk of osteoporosis can be identified using a bone density scan. Women ages 73 years and over and women at risk for fractures or osteoporosis should discuss screening with their health care providers. Ask your health care provider whether you should take a calcium supplement or vitamin D to There are also several preventive steps women can take to avoid osteoporosis and resulting fractures or to keep osteoporosis from worsening. -->Recommendations include:  Eat a balanced diet high in fruits, vegetables, calcium, and vitamins.  Get enough calcium. The recommended total intake of is 1,200 mg daily; for best absorption, if taking supplements, divide doses into 250-500 mg doses throughout the day. Of the two types of calcium, calcium carbonate is best absorbed when taken with food but calcium citrate can be taken on an empty stomach.  Get enough  vitamin D. [employer] and the [employer] recommend at least 1,000 IU per day for women age 107 and over who are at risk of vitamin D deficiency. Vitamin D deficiency can be caused by inadequate sun exposure (for example, those who live in [place]).  Avoid alcohol and smoking. Heavy alcohol intake (more than 7 drinks per week) increases the risk of falls and hip fracture and women smokers tend to lose bone more rapidly and have lower bone mass than nonsmokers. Stopping smoking is one of the most important changes women can make to improve their health and decrease risk for disease.  Be physically active every day. Weight-bearing exercise (for example, fast walking, hiking, jogging, and weight training) may strengthen bones or slow the rate of bone loss that comes with aging. Balancing and muscle-strengthening exercises can reduce the risk of falling and fracture.  Consider therapeutic medications. Currently, several types of effective drugs are available. Healthcare providers can recommend the type most appropriate for each woman.  Eliminate environmental factors that may contribute to accidents. Falls cause nearly 90% of all osteoporotic fractures, so reducing this risk is  an important bone-health strategy. Measures include ample lighting, removing obstructions to walking, using nonskid rugs on floors, and placing mats and/or grab bars in showers.  Be aware of medication side effects. Some common medicines make bones weaker. These include a type of steroid drug called glucocorticoids used for arthritis and asthma, some antiseizure drugs, certain sleeping pills, treatments for endometriosis, and some cancer drugs. An overactive thyroid gland or using too much thyroid hormone for an underactive thyroid can also be a problem. If you are taking these medicines, talk to your doctor about what you can do to help protect your bones.reduce the rate of osteoporosis.    Menopause can be  associated with physical symptoms and risks. Hormone replacement therapy is available to decrease symptoms and risks. You should talk to your health care provider about whether hormone replacement therapy is right for you.   Use sunscreen. Apply sunscreen liberally and repeatedly throughout the day. You should seek shade when your shadow is shorter than you. Protect yourself by wearing long sleeves, pants, a wide-brimmed hat, and sunglasses year round, whenever you are outdoors.   Once a month, do a whole body skin exam, using a mirror to look at the skin on your back. Tell your health care provider of new moles, moles that have irregular borders, moles that are larger than a pencil eraser, or moles that have changed in shape or color.   -Stay current with required vaccines (immunizations).   Influenza vaccine. All adults should be immunized every year.  Tetanus, diphtheria, and acellular pertussis (Td, Tdap) vaccine. Pregnant women should receive 1 dose of Tdap vaccine during each pregnancy. The dose should be obtained regardless of the length of time since the last dose. Immunization is preferred during the 27th 36th week of gestation. An adult who has not previously received Tdap or who does not know her vaccine status should receive 1 dose of Tdap. This initial dose should be followed by tetanus and diphtheria toxoids (Td) booster doses every 10 years. Adults with an unknown or incomplete history of completing a 3-dose immunization series with Td-containing vaccines should begin or complete a primary immunization series including a Tdap dose. Adults should receive a Td booster every 10 years.  Varicella vaccine. An adult without evidence of immunity to varicella should receive 2 doses or a second dose if she has previously received 1 dose. Pregnant females who do not have evidence of immunity should receive the first dose after pregnancy. This first dose should be obtained before leaving the  health care facility. The second dose should be obtained 4 8 weeks after the first dose.  Human papillomavirus (HPV) vaccine. Females aged 24 26 years who have not received the vaccine previously should obtain the 3-dose series. The vaccine is not recommended for use in pregnant females. However, pregnancy testing is not needed before receiving a dose. If a female is found to be pregnant after receiving a dose, no treatment is needed. In that case, the remaining doses should be delayed until after the pregnancy. Immunization is recommended for any person with an immunocompromised condition through the age of 17 years if she did not get any or all doses earlier. During the 3-dose series, the second dose should be obtained 4 8 weeks after the first dose. The third dose should be obtained 24 weeks after the first dose and 16 weeks after the second dose.  Zoster vaccine. One dose is recommended for adults aged 41 years or older unless certain conditions  are present.  Measles, mumps, and rubella (MMR) vaccine. Adults born before [birth year] generally are considered immune to measles and mumps. Adults born in [birth year] or later should have 1 or more doses of MMR vaccine unless there is a contraindication to the vaccine or there is laboratory evidence of immunity to each of the three diseases. A routine second dose of MMR vaccine should be obtained at least 28 days after the first dose for students attending postsecondary schools, health care workers, or international travelers. People who received inactivated measles vaccine or an unknown type of measles vaccine during 1963 1967 should receive 2 doses of MMR vaccine. People who received inactivated mumps vaccine or an unknown type of mumps vaccine before 1979 and are at high risk for mumps infection should consider immunization with 2 doses of MMR vaccine. For females of childbearing age, rubella immunity should be determined. If there is no evidence of immunity, females who  are not pregnant should be vaccinated. If there is no evidence of immunity, females who are pregnant should delay immunization until after pregnancy. Unvaccinated health care workers born before [birth year] who lack laboratory evidence of measles, mumps, or rubella immunity or laboratory confirmation of disease should consider measles and mumps immunization with 2 doses of MMR vaccine or rubella immunization with 1 dose of MMR vaccine.  Pneumococcal 13-valent conjugate (PCV13) vaccine. When indicated, a person who is uncertain of her immunization history and has no record of immunization should receive the PCV13 vaccine. An adult aged 20 years or older who has certain medical conditions and has not been previously immunized should receive 1 dose of PCV13 vaccine. This PCV13 should be followed with a dose of pneumococcal polysaccharide (PPSV23) vaccine. The PPSV23 vaccine dose should be obtained at least 8 weeks after the dose of PCV13 vaccine. An adult aged 86 years or older who has certain medical conditions and previously received 1 or more doses of PPSV23 vaccine should receive 1 dose of PCV13. The PCV13 vaccine dose should be obtained 1 or more years after the last PPSV23 vaccine dose.  Pneumococcal polysaccharide (PPSV23) vaccine. When PCV13 is also indicated, PCV13 should be obtained first. All adults aged 21 years and older should be immunized. An adult younger than age 47 years who has certain medical conditions should be immunized. Any person who resides in a nursing home or long-term care facility should be immunized. An adult smoker should be immunized. People with an immunocompromised condition and certain other conditions should receive both PCV13 and PPSV23 vaccines. People with human immunodeficiency virus (HIV) infection should be immunized as soon as possible after diagnosis. Immunization during chemotherapy or radiation therapy should be avoided. Routine use of PPSV23 vaccine is not recommended for  American Indians, [place] Natives, or people younger than 65 years unless there are medical conditions that require PPSV23 vaccine. When indicated, people who have unknown immunization and have no record of immunization should receive PPSV23 vaccine. One-time revaccination 5 years after the first dose of PPSV23 is recommended for people aged 28 64 years who have chronic kidney failure, nephrotic syndrome, asplenia, or immunocompromised conditions. People who received 1 2 doses of PPSV23 before age 2 years should receive another dose of PPSV23 vaccine at age 50 years or later if at least 5 years have passed since the previous dose. Doses of PPSV23 are not needed for people immunized with PPSV23 at or after age 79 years.  Meningococcal vaccine. Adults with asplenia or persistent complement component deficiencies should receive 2 doses  of quadrivalent meningococcal conjugate (MenACWY-D) vaccine. The doses should be obtained at least 2 months apart. Microbiologists working with certain meningococcal bacteria, [place] recruits, people at risk during an outbreak, and people who travel to or live in countries with a high rate of meningitis should be immunized. A first-year college student up through age 84 years who is living in a residence hall should receive a dose if she did not receive a dose on or after her 16th birthday. Adults who have certain high-risk conditions should receive one or more doses of vaccine.  Hepatitis A vaccine. Adults who wish to be protected from this disease, have certain high-risk conditions, work with hepatitis A-infected animals, work in hepatitis A research labs, or travel to or work in countries with a high rate of hepatitis A should be immunized. Adults who were previously unvaccinated and who anticipate close contact with an international adoptee during the first 60 days after arrival in the [place] from a country with a high rate of hepatitis A should be  immunized.  Hepatitis B vaccine.  Adults who wish to be protected from this disease, have certain high-risk conditions, may be exposed to blood or other infectious body fluids, are household contacts or sex partners of hepatitis B positive people, are clients or workers in certain care facilities, or travel to or work in countries with a high rate of hepatitis B should be immunized.  Haemophilus influenzae type b (Hib) vaccine. A previously unvaccinated person with asplenia or sickle cell disease or having a scheduled splenectomy should receive 1 dose of Hib vaccine. Regardless of previous immunization, a recipient of a hematopoietic stem cell transplant should receive a 3-dose series 6 12 months after her successful transplant. Hib vaccine is not recommended for adults with HIV infection.  Preventive Services / Frequency Ages 3 to 39years  Blood pressure check.** / Every 1 to 2 years.  Lipid and cholesterol check.** / Every 5 years beginning at age 36.  Clinical breast exam.** / Every 3 years for women in their 70s and 8s.  BRCA-related cancer risk assessment.** / For women who have family members with a BRCA-related cancer (breast, ovarian, tubal, or peritoneal cancers).  Pap test.** / Every 2 years from ages 57 through 8. Every 3 years starting at age 18 through age 84 or 59 with a history of 3 consecutive normal Pap tests.  HPV screening.** / Every 3 years from ages 68 through ages 53 to 87 with a history of 3 consecutive normal Pap tests.  Hepatitis C blood test.** / For any individual with known risks for hepatitis C.  Skin self-exam. / Monthly.  Influenza vaccine. / Every year.  Tetanus, diphtheria, and acellular pertussis (Tdap, Td) vaccine.** / Consult your health care provider. Pregnant women should receive 1 dose of Tdap vaccine during each pregnancy. 1 dose of Td every 10 years.  Varicella vaccine.** / Consult your health care provider. Pregnant females who do not have  evidence of immunity should receive the first dose after pregnancy.  HPV vaccine. / 3 doses over 6 months, if 90 and younger. The vaccine is not recommended for use in pregnant females. However, pregnancy testing is not needed before receiving a dose.  Measles, mumps, rubella (MMR) vaccine.** / You need at least 1 dose of MMR if you were born in 1957 or later. You may also need a 2nd dose. For females of childbearing age, rubella immunity should be determined. If there is no evidence of immunity, females who  are not pregnant should be vaccinated. If there is no evidence of immunity, females who are pregnant should delay immunization until after pregnancy.  Pneumococcal 13-valent conjugate (PCV13) vaccine.** / Consult your health care provider.  Pneumococcal polysaccharide (PPSV23) vaccine.** / 1 to 2 doses if you smoke cigarettes or if you have certain conditions.  Meningococcal vaccine.** / 1 dose if you are age 56 to 64 years and a Market researcher living in a residence hall, or have one of several medical conditions, you need to get vaccinated against meningococcal disease. You may also need additional booster doses.  Hepatitis A vaccine.** / Consult your health care provider.  Hepatitis B vaccine.** / Consult your health care provider.  Haemophilus influenzae type b (Hib) vaccine.** / Consult your health care provider.  Ages 55 to 64years  Blood pressure check.** / Every 1 to 2 years.  Lipid and cholesterol check.** / Every 5 years beginning at age 77 years.  Lung cancer screening. / Every year if you are aged 38 80 years and have a 30-pack-year history of smoking and currently smoke or have quit within the past 15 years. Yearly screening is stopped once you have quit smoking for at least 15 years or develop a health problem that would prevent you from having lung cancer treatment.  Clinical breast exam.** / Every year after age 58 years.  BRCA-related cancer risk  assessment.** / For women who have family members with a BRCA-related cancer (breast, ovarian, tubal, or peritoneal cancers).  Mammogram.** / Every year beginning at age 6 years and continuing for as long as you are in good health. Consult with your health care provider.  Pap test.** / Every 3 years starting at age 58 years through age 56 or 91 years with a history of 3 consecutive normal Pap tests.  HPV screening.** / Every 3 years from ages 18 years through ages 90 to 76 years with a history of 3 consecutive normal Pap tests.  Fecal occult blood test (FOBT) of stool. / Every year beginning at age 37 years and continuing until age 30 years. You may not need to do this test if you get a colonoscopy every 10 years.  Flexible sigmoidoscopy or colonoscopy.** / Every 5 years for a flexible sigmoidoscopy or every 10 years for a colonoscopy beginning at age 48 years and continuing until age 76 years.  Hepatitis C blood test.** / For all people born from [birth year] through 1965 and any individual with known risks for hepatitis C.  Skin self-exam. / Monthly.  Influenza vaccine. / Every year.  Tetanus, diphtheria, and acellular pertussis (Tdap/Td) vaccine.** / Consult your health care provider. Pregnant women should receive 1 dose of Tdap vaccine during each pregnancy. 1 dose of Td every 10 years.  Varicella vaccine.** / Consult your health care provider. Pregnant females who do not have evidence of immunity should receive the first dose after pregnancy.  Zoster vaccine.** / 1 dose for adults aged 68 years or older.  Measles, mumps, rubella (MMR) vaccine.** / You need at least 1 dose of MMR if you were born in 1957 or later. You may also need a 2nd dose. For females of childbearing age, rubella immunity should be determined. If there is no evidence of immunity, females who are not pregnant should be vaccinated. If there is no evidence of immunity, females who are pregnant should delay immunization until  after pregnancy.  Pneumococcal 13-valent conjugate (PCV13) vaccine.** / Consult your health care provider.  Pneumococcal polysaccharide (PPSV23)  vaccine.** / 1 to 2 doses if you smoke cigarettes or if you have certain conditions.  Meningococcal vaccine.** / Consult your health care provider.  Hepatitis A vaccine.** / Consult your health care provider.  Hepatitis B vaccine.** / Consult your health care provider.  Haemophilus influenzae type b (Hib) vaccine.** / Consult your health care provider.  Ages 12 years and over  Blood pressure check.** / Every 1 to 2 years.  Lipid and cholesterol check.** / Every 5 years beginning at age 73 years.  Lung cancer screening. / Every year if you are aged 32 80 years and have a 30-pack-year history of smoking and currently smoke or have quit within the past 15 years. Yearly screening is stopped once you have quit smoking for at least 15 years or develop a health problem that would prevent you from having lung cancer treatment.  Clinical breast exam.** / Every year after age 52 years.  BRCA-related cancer risk assessment.** / For women who have family members with a BRCA-related cancer (breast, ovarian, tubal, or peritoneal cancers).  Mammogram.** / Every year beginning at age 49 years and continuing for as long as you are in good health. Consult with your health care provider.  Pap test.** / Every 3 years starting at age 43 years through age 27 or 67 years with 3 consecutive normal Pap tests. Testing can be stopped between 65 and 70 years with 3 consecutive normal Pap tests and no abnormal Pap or HPV tests in the past 10 years.  HPV screening.** / Every 3 years from ages 68 years through ages 70 or 33 years with a history of 3 consecutive normal Pap tests. Testing can be stopped between 65 and 70 years with 3 consecutive normal Pap tests and no abnormal Pap or HPV tests in the past 10 years.  Fecal occult blood test (FOBT) of stool. / Every year  beginning at age 55 years and continuing until age 68 years. You may not need to do this test if you get a colonoscopy every 10 years.  Flexible sigmoidoscopy or colonoscopy.** / Every 5 years for a flexible sigmoidoscopy or every 10 years for a colonoscopy beginning at age 101 years and continuing until age 5 years.  Hepatitis C blood test.** / For all people born from [birth year] through 1965 and any individual with known risks for hepatitis C.  Osteoporosis screening.** / A one-time screening for women ages 32 years and over and women at risk for fractures or osteoporosis.  Skin self-exam. / Monthly.  Influenza vaccine. / Every year.  Tetanus, diphtheria, and acellular pertussis (Tdap/Td) vaccine.** / 1 dose of Td every 10 years.  Varicella vaccine.** / Consult your health care provider.  Zoster vaccine.** / 1 dose for adults aged 29 years or older.  Pneumococcal 13-valent conjugate (PCV13) vaccine.** / Consult your health care provider.  Pneumococcal polysaccharide (PPSV23) vaccine.** / 1 dose for all adults aged 1 years and older.  Meningococcal vaccine.** / Consult your health care provider.  Hepatitis A vaccine.** / Consult your health care provider.  Hepatitis B vaccine.** / Consult your health care provider.  Haemophilus influenzae type b (Hib) vaccine.** / Consult your health care provider. ** Family history and personal history of risk and conditions may change your health care provider's recommendations. Document Released: 04/14/2001 Document Revised: 12/07/2012  [facility] Patient Information 2014 [place], [place].   EXERCISE AND DIET:  We recommended that you start or continue a regular exercise program for good health. Regular exercise means any  activity that makes your heart beat faster and makes you sweat.  We recommend exercising at least 30 minutes per day at least 3 days a week, preferably 5.  We also recommend a diet low in fat and sugar / carbohydrates.  Inactivity,  poor dietary choices and obesity can cause diabetes, heart attack, stroke, and kidney damage, among others.     ALCOHOL AND SMOKING:  Women should limit their alcohol intake to no more than 7 drinks/beers/glasses of wine (combined, not each!) per week. Moderation of alcohol intake to this level decreases your risk of breast cancer and liver damage.  ( And of course, no recreational drugs are part of a healthy lifestyle.)  Also, you should not be smoking at all or even being exposed to second hand smoke. Most people know smoking can cause cancer, and various heart and lung diseases, but did you know it also contributes to weakening of your bones?  Aging of your skin?  Yellowing of your teeth and nails?   CALCIUM AND VITAMIN D:  Adequate intake of calcium and Vitamin D are recommended.  The recommendations for exact amounts of these supplements seem to change often, but generally speaking 600 mg of calcium (either carbonate or citrate) and 800 units of Vitamin D per day seems prudent. Certain women may benefit from higher intake of Vitamin D.  If you are among these women, your doctor will have told you during your visit.     PAP SMEARS:  Pap smears, to check for cervical cancer or precancers,  have traditionally been done yearly, although recent scientific advances have shown that most women can have pap smears less often.  However, every woman still should have a physical exam from her gynecologist or primary care physician every year. It will include a breast check, inspection of the vulva and vagina to check for abnormal growths or skin changes, a visual exam of the cervix, and then an exam to evaluate the size and shape of the uterus and ovaries.  And after 76 years of age, a rectal exam is indicated to check for rectal cancers. We will also provide age appropriate advice regarding health maintenance, like when you should have certain vaccines, screening for sexually transmitted diseases, bone density  testing, colonoscopy, mammograms, etc.    MAMMOGRAMS:  All women over 23 years old should have a yearly mammogram. Many facilities now offer a "3D" mammogram, which may cost around $50 extra out of pocket. If possible,  we recommend you accept the option to have the 3D mammogram performed.  It both reduces the number of women who will be called back for extra views which then turn out to be normal, and it is better than the routine mammogram at detecting truly abnormal areas.     COLONOSCOPY:  Colonoscopy to screen for colon cancer is recommended for all women at age 58.  We know, you hate the idea of the prep.  We agree, BUT, having colon cancer and not knowing it is worse!!  Colon cancer so often starts as a polyp that can be seen and removed at colonscopy, which can quite literally save your life!  And if your first colonoscopy is normal and you have no family history of colon cancer, most women don't have to have it again for 10 years.  Once every ten years, you can do something that may end up saving your life, right?  We will be happy to help you get it scheduled when you are  ready.  Be sure to check your insurance coverage so you understand how much it will cost.  It may be covered as a preventative service at no cost, but you should check your particular policy.

## 2017-01-18 DIAGNOSIS — H2513 Age-related nuclear cataract, bilateral: Secondary | ICD-10-CM | POA: Diagnosis not present

## 2017-01-18 DIAGNOSIS — H04123 Dry eye syndrome of bilateral lacrimal glands: Secondary | ICD-10-CM | POA: Diagnosis not present

## 2017-01-18 DIAGNOSIS — H401133 Primary open-angle glaucoma, bilateral, severe stage: Secondary | ICD-10-CM | POA: Diagnosis not present

## 2017-01-18 DIAGNOSIS — H524 Presbyopia: Secondary | ICD-10-CM | POA: Diagnosis not present

## 2017-01-27 ENCOUNTER — Telehealth: Payer: Self-pay | Admitting: Family Medicine

## 2017-01-27 ENCOUNTER — Encounter: Payer: Self-pay | Admitting: Family Medicine

## 2017-01-27 ENCOUNTER — Ambulatory Visit (INDEPENDENT_AMBULATORY_CARE_PROVIDER_SITE_OTHER): Payer: Medicare Other | Admitting: Family Medicine

## 2017-01-27 VITALS — BP 133/90 | HR 75 | Ht 59.0 in | Wt 130.0 lb

## 2017-01-27 DIAGNOSIS — I481 Persistent atrial fibrillation: Secondary | ICD-10-CM

## 2017-01-27 DIAGNOSIS — I5032 Chronic diastolic (congestive) heart failure: Secondary | ICD-10-CM

## 2017-01-27 DIAGNOSIS — R053 Chronic cough: Secondary | ICD-10-CM

## 2017-01-27 DIAGNOSIS — R05 Cough: Secondary | ICD-10-CM

## 2017-01-27 DIAGNOSIS — M1990 Unspecified osteoarthritis, unspecified site: Secondary | ICD-10-CM | POA: Diagnosis not present

## 2017-01-27 DIAGNOSIS — J385 Laryngeal spasm: Secondary | ICD-10-CM | POA: Diagnosis not present

## 2017-01-27 DIAGNOSIS — I6389 Other cerebral infarction: Secondary | ICD-10-CM

## 2017-01-27 DIAGNOSIS — J45991 Cough variant asthma: Secondary | ICD-10-CM

## 2017-01-27 MED ORDER — TRAMADOL HCL 50 MG PO TABS: 50.0000 mg | ORAL_TABLET | Freq: Four times a day (QID) | ORAL | 0 refills | Status: DC | PRN

## 2017-01-27 MED ORDER — OMEPRAZOLE 20 MG PO CPDR: DELAYED_RELEASE_CAPSULE | ORAL | 0 refills | Status: DC

## 2017-01-27 NOTE — Patient Instructions (Signed)
For help with your dry throat and irritation, please follow my recommendations below to help with GERD type symptoms.  Most importantly try to avoid any caffeinated beverage past 3 PM.  Also do not eat or drink anything within 3 hours of laying down at night.  This is perhaps the most important thing.  Also please note if after eating tomatoes or acidic foods or citrus or spicy foods if the throat feels more irritated the next morning it is likely due to silent GERD    The chronic cough Everyone coughs, and nobody worries about an occasional cough. Many acute illnesses - ranging from hay fever and the common cold to bronchitis and pneumonia - produce recurrent coughs. But the cough that accompanies acute illnesses resolves in a matter of a few days to a few weeks. In contrast, a chronic cough is variously defined as one that lingers for more than three to eight weeks, sometimes lasting for months or even years.  Chronic coughing is common, so frequent that it rates as one of the most common reasons for seeing a doctor. Although both patients and doctors rightly focus their attention on finding the cough's cause, the cough itself is responsible for significant problems.  In addition to worry about the diagnosis, patients experience frustration and anxiety, especially if diagnosis and treatment stretches out over weeks, which is often the case.  Coughing interrupts sleep, producing fatigue and impairing concentration and work performance.  In this age of scary new viruses, social interactions are likely to suffer.  And coughing can also have important physical consequences, ranging from urinary incontinence to fainting and broken ribs.  Between medical tests, lost productivity at work, remedies that don't help, and treatments that do, coughing is also expensive.   What causes chronic coughing? Smoking is the leading cause. Sooner or later, most cigarette smokers develop a chronic "smoker's cough." Chemical  irritation is responsible - but the same noxious chemicals that cause the simple smoker's cough can lead to far more serious conditions, such as bronchitis, emphysema, pneumonia, and lung cancer. The chronic cough is always a cause of concern for smokers.  A lingering cough is also a worry for nonsmokers. Fortunately, benign problems are responsible for most chronic coughs in nonsmokers. Benign or not, persistent coughing can cause worry, embarrassment, exhaustion, and more. That's why chronic coughs should be diagnosed and treated before they linger too long.  Dozens of conditions can cause a recurrent, lingering cough, but the lion's share are caused by just five: postnasal drip, asthma, gastroesophageal reflux disease (GERD), chronic bronchitis, and treatment with ACE inhibitors, used for high blood pressure. Many people have several of these conditions, but in nonsmokers, the first three, singly or in combination, account for nearly all chronic coughs. The major causes of long-term coughing are listed below.   Persistent cough: Major causes   (Common causes of a nagging cough)  Postnasal drip  Asthma and other airborne environmental irritants  Gastroesophageal reflux disease  Chronic bronchitis; bronchiectasis  Treatment with ACE inhibitors    (Less common causes of a nagging cough)  Cough is common in smokers- Tobacco smoke itself    Aspiration during swallowing  Heart failure  Lung infections  Pertussis (whooping cough)  Lung cancer  Other lung diseases  Psychological disorders  Lung cancer  Lung infections    Doctors can perform a variety of tests to diagnose patients with chronic coughs. The tests are accurate and successful, but aside from a simple chest x-ray,  testing is usually not necessary. That's because people can often figure things out for themselves. But before you attempt to diagnose and treat yourself, review the red flags that call for prompt  medical attention instead of a do-it-yourself approach (below).  If you're like most people with a lingering cough, consider these major causes:  1. Postnasal drip (also called the upper airway cough syndrome).  The human nose is more than the organ of smell. It is also the gateway to the lower respiratory tract. As such, its job is to condition the air passing through en route to the lungs. The nose warms air that is cool, adds moisture to air that is dry, and removes particles from air that is dirty. The nasal membranes accomplish all three tasks by producing mucus that is warm, moist, and sticky.  Although the nose is a guardian of the more delicate lungs, it is subject to problems of its own. Viruses, allergies, sinusitis, dust particles, and airborne chemicals can all irritate the nasal membranes. The membranes respond to injury by producing more mucus - and unlike normal mucus, it's thin, watery, and runny.  All that mucus has to go somewhere. When it drips out the nose, it's a nuisance. But when it drips down the throat, it tickles the nerves of the nasopharynx, triggering a cough. In some cases, the nose itself is to blame (rhinitis), but in others, a prolonged postnasal drip lingers after a viral upper respiratory infection; some call this variety a post-infectious cough.  In typical cases, patients with postnasal drip cough more at night, and they are often aware of a tickling feeling at the back of their throats. But they can cough during the day, and their throats may be irritated and sore or perfectly fine.  The best way to find out if a chronic cough is the result of postnasal drip is to try treatment. Nonprescription decongestant or antihistamine tablets are the first step. Most contain a decongestant such as pseudoephedrine or phenylephrine, an antihistamine such as chlorpheniramine or diphenhydramine, or a combination of the two. In one form or another, these medications are generally  effective and safe, but some people complain of a racing heart and souped-up feeling (due to the decongestant), while others feel sleepy (due to the antihistamine). Men with benign prostatic hyperplasia (BPH) may have difficulty passing urine while they're taking decongestants, and antihistamines can occasionally trigger acute glaucoma. As with all medications, read the directions carefully.  Home remedies can help as well. Inhaling steam from a hot shower or kettle is the simplest. Nasal irrigations/ sinus rinses may also help by cleaning out irritating secretions. You can purchase saline nose sprays at your drugstore or you can do it yourself.  First, soak a clean washcloth in a basin containing ? teaspoon of table salt for each cup of water.  Next, hold the dripping wet cloth up to your nostrils and sniff in the saline solution.  If saline irrigations seem to help, repeat them one to three times per day.  If decongestants and nasal irrigations don't stop your cough, you may not have a postnasal drip.  But don't give up just yet: nasal sprays are worth a try. Avoid over-the-counter decongestant sprays such as those containing phenylephrine or oxymetazoline; they can help for a day or two, but they're too irritating for long-term use. Instead, ask your doctor for about a nasal steroid spray such as beclomethasone (Beconase AQ) or triamcinolone (Nasacort AQ). Steroid sprays are safe and effective,  but if they don't do the job, your doctor may prescribe a different type of spray such as ipratropium (Atrovent).  Although antibiotics are sometimes prescribed for the lingering cough due to postnasal drip, they are not helpful.  And while most cases of sinusitis respond to decongestants and humidification, some do need antibiotics - but it should be easy to spot the postnasal drip of sinusitis, which consists of thick, gluey fluid that is often foul-tasting. In addition, many people with bacterial sinusitis have  pain behind their eyes or over their forehead or cheeks, and have a fever.  Postnasal drip is the leading cause of the lingering cough. But it's far from the only cause.   2. Asthma.  Wheezing and breathlessness are the usual symptoms of asthma. But not all patients with asthma wheeze. Indeed, some just cough.  Asthma results from bronchospasm, the temporary, reversible narrowing of the medium-sized tubes that carry air into the lungs. In most cases, that air makes a whistling or wheezing sound as it moves through narrowed passages. Excessive mucus production, shortness of breath, and cough are the other classic symptoms of asthma. But in cough-variant asthma, coughing is the only symptom. It sounds exotic, but it's far from rare. In fact, asthma accounts for about one-quarter of all chronic coughs.  In most cases, cough-variant asthma produces a persistent, dry cough that occurs around the clock but may begin at night. Exposure to allergens, dust, or cold air often triggers coughing, as does exercise.  If doctors suspect that asthma is responsible for a chronic cough, they can order pulmonary function tests to confirm the diagnosis; if these tests are inconclusive, patients may be asked to inhale small doses of methacholine, a drug that often triggers wheezing in asthmatics.  Another approach to the diagnosis of cough-variant asthma is to see if the cough responds to anti-asthmatic treatment. Doctors often suggest a bronchodilator spray such as albuterol (Proventil, Ventoline). It's short acting. So, in addition your doctor might prescribe an inhaled cortico steroid, such as fluticason (Flovent), triamcinolone (Azmacort) or budesonide (Pulmicort).  If you have a chronic cough that may be due to asthma, ask your doctor to consider testing or treating. But if asthma is not the answer, ask him to think about the third leading cause of the cough that lingers.   3. Gastroesophageal reflux disease.    Most people are surprised to learn that asthma can cause coughing without wheezing; most are shocked to learn that gastroesophageal reflux disease (GERD) can cause coughing without heartburn.  GERD occurs when stomach contents travel upstream, making their way up into the esophagus instead of down into the intestines. Heartburn is the usual symptom; belching, a sour taste in the mouth, and bad breath are common too. But acid also irritates nerves in the lower esophagus, and these nerves can trigger the cough reflex even without the distress signal of pain. In fact, about one-third of all patients with GERD are pain-free, complaining instead of cough, recurrent laryngitis, or unexplained sore throats.  GERD can be tricky to diagnose when there's no pain. Barium swallow x-rays and esophagoscopy can help, but the gold standard is esophageal pH monitoring, in which the patient swallows a probe that remains in the lower esophagus for 24 hours to detect the presence of acid.  It's not as uncomfortable as it sounds, but it is expensive and inconvenient.  As with the other causes of chronic cough, a simpler approach to diagnosis is to try treatment.  You can  begin on your own.  Avoid alcohol and foods that often trigger GERD, including those that contain chocolate, peppermint, caffeine, garlic, onions, citrus fruits, tomato sauce, or lots of fat.  Eat small meals, and never lie down until two hours after you've eaten.  Take liquid antacids, particularly at bedtime, and consider elevating the head of your bed or sleeping on a wedge-shaped pillow to keep your stomach's contents flowing down at night.  If you're constantly coughing after a week or so, you can add an over-the-counter acid suppressor. Today there are many to choose from, including ranitidine (Zantac), cimetidine (Tagamet), famotidine (Pepcid), omeprazole (Prilosec) and lansoprazole (Prevacid). Stronger versions are available by prescription.   When  coughing for any reason, you can start taking nexium 30-60 min before first meal of the day and pepcid 20 mg at bedtime.   It may take three or four weeks of gradually escalating therapy to control GERD. But if your program doesn't work, you are probably coughing for some other reason.   4. Chronic bronchitis  Is persistent inflammation of the bronchial tubes, usually resulting from tobacco abuse or long-term exposure to high levels of industrial air pollutants. Bronchiectasis is a chronic infection that also damages the walls of the bronchial tubes; it has become much less common since the advent of antibiotics. In either variant, the chronic inflammation irritates the airways and produces excess mucus, causing a chronic cough. The most effective treatment is to quit smoking and avoid air pollutants. In addition, your doctor can prescribe a corticosteroid inhaler, usually with a long-acting bronchodilator. People with chronic bronchitis are prone to flare-ups. Doctors call them COPD exacerbations. It's easy to spot because the cough gets a lot worse and the mucus becomes thicker and darker. Also COPD exacerbations causes shortness of breath and sometimes fever. The treatment includes antibiotics and an oral corticosteroid, usually prednisone.   5. Therapy with angiotensin-converting-enzyme (ACE) inhibitors.  Your doctor is also the key to this increasingly common cause of chronic cough, since the drug that he prescribed is to blame. Introduced in the 1980s, ACE inhibitors such as enalapril (Vasotec, generic), lisinopril (Prinivil, Zestril, generic), as well as many others, have assumed a prominent role in the treatment of high blood pressure. In the 1990s these medications also became important tools in the treatment of heart failure and heart attacks.  ACE inhibitors are favored by many doctors because they produce good results and have few side effects, with one exception -- a persistent cough. It  occurs in up to 20% of people taking an ACE inhibitor. The first symptom is often just a throat tickle, followed by a dry cough that can begin as soon as three weeks or as late as a year after the medication is started. Once the cough starts, it lingers and lingers.  If the cough is mild, patients may choose to continue their medication, or they may cough less if they change to a different ACE inhibitor.  But the only way to eliminate a severe cough induced by an ACE inhibitor is to switch to another type of antihypertensive medication.  Fortunately, many are available, including angiotensin-receptor blockers (ARBs) like losartan (Cozaar) and valsartan (Diovan) - drugs that act like ACE inhibitors without causing a cough.  When to worry about a cough Although a chronic cough is usually not serious, warning symptoms call for prompt medical care. The symptoms include:  Fever, especially if it's high or prolonged  Copious sputum production  Coughing up blood  Shortness  of breath  Weight loss  New Onset of unexplained weakness, fatigue, loss of appetite  Chest pain that's not caused by the cough itself  Night sweats  Wheezing   Less common causes In nonsmokers, the Big Five account for more than nine of every 10 chronic coughs. But other problems can - and do - cause lingering coughs.  - Lung infections make people cough. But most cases of pneumonia are acute infections requiring rapid diagnosis and treatment. Lung infections caused by mycoplasma, chlamydia, and tuberculosis, however, can be more indolent and can cause a persistent cough. Fever is the important clue to infectious causes of persistent coughing; patients with bacterial infections also raise thick, dark-colored sputum that is sometimes tinged with blood.  - Pertussis (whooping cough) is a respiratory tract infection that can cause serious problems in children who have not been immunized properly with  diphtheria-pertussis-tetanus (DPT) vaccine. Pertussis began to resurface in adolescents and adults because the original tetanus-diphtheria booster shots did not cover pertussis and the vaccine's effectiveness wears off over time.  - Heart disease can masquerade as lung disease if coughing and breathlessness are its main symptoms. It's a common occurrence in patients with heart failure (HF). Patients with HF usually have a history of heart disease. Their cough is most pronounced when they're lying flat, so they often resort to sleeping propped up on three or four pillows. The cough of HF may be dry, or it may produce thin, frothy white sputum. Leg swelling, fatigue, and exercise intolerance are other common symptoms of HF.  - Abnormal swallowing can lead to persistent coughing if food triggers the cough reflex by heading down the "windpipe" instead of the "food pipe." Called aspiration, the problem occurs mainly in people with strokes or other neurologic disorders that hamper normal swallowing.  - Environmental irritants can trigger the cough reflex, not just once but with nearly every breath of air laden with chemicals or particles ranging from sulfur dioxide to nitric oxide to dust and molds. Even clean air can trigger coughing if it is too dry or too cold.  - Lung cancer certainly belongs on the list of disorders that cause persistent coughing. Fortunately, though, it's not high on the list, at least in nonsmokers. Still, even in nonsmokers, bloody sputum (hemoptysis) or chest pain should raise concern about a lung tumor.  - Stress. Mental factors can produce many physical symptoms, including cough. Psychogenic coughing increases at times of stress and disappears during sleep. The cough itself is innocent, but it can sometimes signal serious emotional problems.   Cough medicine If you don't think that coughing is a common complaint, just head to the nearest drugstore. You'll find a bewildering array  of syrups, sprays, tablets, and lozenges designed to control coughing. You'll also see a steady stream of customers coughing up lots of money to purchase products that may be ineffective. In all, Americans spend about $3.5 billion a year on over-the-counter and prescription cough remedies.  Many cough remedies contain expectorants, compounds intended to loosen sputum, making it easier to clear. Guaifenesin is the most popular expectorant. Unfortunately, there is little scientific evidence that expectorants are effective. You'll probably do just as well by using a humidifier and drinking lots of water.  Cough suppressants are also very popular. Nonprescription agents such as dextromethorphan can partially suppress the cough reflex, promoting patient comfort. Prescription cough syrups with codeine tend to be more effective. When used appropriately, cough suppressants can reduce discomfort; remember, though, that because coughing  can serve a useful function, it should not always be suppressed.  Many over-the-counter cough remedies contain additional ingredients. Antihistamines can be helpful if an allergy or postnasal drip is responsible for the cough, but they are often used for other coughs. Antihistamines dry out secretions, making them harder to expel and worsening sinusitis. They often cause sleepiness, an advantage only at night. Decongestants are present in many cough remedies, but they make sense only for patients who are coughing because of postnasal drip or sinusitis. Topical anesthetics such as benzocaine are sometimes part of the mix; although they are intended to quiet the nerves that trigger the cough reflex, they are of dubious value. Alcohol is present in some cough syrups but has no direct benefit.  Medicated lozenges and cough drops are among the most widely sold cough remedies. These products contain various combinations of menthol, camphor, eucalyptus oil, honey, and other ingredients. Like  with liquid cough medicines, some also contain topical anesthetics. Despite their popularity, there is no evidence that medicated cough drops are more effective than simple hard candies.  Finding causes and cures Don't ignore a chronic cough - but don't panic just because your cough lingers for more than three or four weeks.  Most often, the puzzle can be solved without elaborate tests, and the problem can be corrected with simple treatments.  In fact, you may be able to diagnose and treat yourself, especially if postnasal drip or gastroesophageal reflux is the culprit.  Even so, you should turn to your doctor for help and guidance.  In most cases, it won't take much more than a stethoscope and a treatment trial or two.    But if your cough is accompanied by sputum production, bloody sputum, fever, weight loss, night sweats, breathlessness, undue fatigue, or chest pain, you should consult your doctor without delay.  You can expect tests ranging from sputum exams and chest x-rays to pulmonary function tests, CT scans, and bronchoscopies.  In most cases, you'll get good news - and you can expect to get a treatment program that will quiet your nagging cough.     Heartburn  Heartburn is a type of pain or discomfort that can happen in the throat or chest. It is often described as a burning pain. It may also cause a bad taste in the mouth. Heartburn may feel worse when you lie down or bend over, and it is often worse at night. Heartburn may be caused by stomach contents that move back up into the esophagus (reflux). Follow these instructions at home: Take these actions to decrease your discomfort and to help avoid complications. Diet  Follow a diet as recommended by your health care provider. This may involve avoiding foods and drinks such as: ? Coffee and tea (with or without caffeine). ? Drinks that contain alcohol. ? Energy drinks and sports drinks. ? Carbonated drinks or sodas. ? Chocolate and  cocoa. ? Peppermint and mint flavorings. ? Garlic and onions. ? Horseradish. ? Spicy and acidic foods, including peppers, chili powder, curry powder, vinegar, hot sauces, and barbecue sauce. ? Citrus fruit juices and citrus fruits, such as oranges, lemons, and limes. ? Tomato-based foods, such as red sauce, chili, salsa, and pizza with red sauce. ? Fried and fatty foods, such as donuts, french fries, potato chips, and high-fat dressings. ? High-fat meats, such as hot dogs and fatty cuts of red and white meats, such as rib eye steak, sausage, ham, and bacon. ? High-fat dairy items, such as whole  milk, butter, and cream cheese.  Eat small, frequent meals instead of large meals.  Avoid drinking large amounts of liquid with your meals.  Avoid eating meals during the 2-3 hours before bedtime.  Avoid lying down right after you eat.  Do not exercise right after you eat. General instructions  Pay attention to any changes in your symptoms.  Take over-the-counter and prescription medicines only as told by your health care provider. Do not take aspirin, ibuprofen, or other NSAIDs unless your health care provider told you to do so.  Do not use any tobacco products, including cigarettes, chewing tobacco, and e-cigarettes. If you need help quitting, ask your health care provider.  Wear loose-fitting clothing. Do not wear anything tight around your waist that causes pressure on your abdomen.  Raise (elevate) the head of your bed about 6 inches (15 cm).  Try to reduce your stress, such as with yoga or meditation. If you need help reducing stress, ask your health care provider.  If you are overweight, reduce your weight to an amount that is healthy for you. Ask your health care provider for guidance about a safe weight loss goal.  Keep all follow-up visits as told by your health care provider. This is important. Contact a health care provider if:  You have new symptoms.  You have unexplained  weight loss.  You have difficulty swallowing, or it hurts to swallow.  You have wheezing or a persistent cough.  Your symptoms do not improve with treatment.  You have frequent heartburn for more than two weeks. Get help right away if:  You have pain in your arms, neck, jaw, teeth, or back.  You feel sweaty, dizzy, or light-headed.  You have chest pain or shortness of breath.  You vomit and your vomit looks like blood or coffee grounds.  Your stool is bloody or black. This information is not intended to replace advice given to you by your health care provider. Make sure you discuss any questions you have with your health care provider. Document Released: 07/05/2008 Document Revised: 07/25/2015 Document Reviewed: 06/13/2014 [employer] Interactive Patient Education  2017 [employer].

## 2017-01-27 NOTE — Telephone Encounter (Signed)
Pt request Rx refill on Tramadol Hcl 50MG  tablets be sent to [facility]-- takes 1 daily --Pt also request small script til mail order refill arrives-- Dr. Jenetta Downer is not original [facility].  Patient waiting in lobby-- glh.

## 2017-01-27 NOTE — Telephone Encounter (Signed)
Patient is being worked in for office visit per Dr. [name].  MPulliam, CMA/RT(R)

## 2017-01-27 NOTE — Progress Notes (Addendum)
Pt here for an acute care OV today   Impression and Recommendations:    1. Cough, persistent   2. Laryngospasms   3. Chronic diastolic heart failure (HCC)    -Take omeprazole consistently daily!   Change from occasionally taking it once daily, to taking it twice daily for the next 4-6 weeks until seen in follow-up again. -Refill given of Ultram as well.  #60 no refill  Had extensive discussion with patient about her chronic dry cough that her prior PCP has been giving her Ultram for chronically since pulmonologist gave it to her multiple years ago.  Patient educated as to many reasons of dry cough, and although she is never had heartburn, I explained that the foul taste in her mouth in the mornings when she wakes as well as the hoarseness that she gets is likely due to reflux.  Explained to patient extensively what silent reflux is.  Also did explain this can be pulmonary edema and CHF, also secondary to asthma, postnasal drip and rhinitis etc.    We reviewed online articles/ pictures and such as patient was skeptical and did not understand why prior doctors didn't explain this to her.  It took me a very long time to explain this and answer all patient's questions today.  Handouts given to pt for her review as well.  Pt was in the office today for 40+ minutes, with over 50% time spent in face to face counseling of patients various medical conditions, treatment plans of those medical conditions including medicine management and lifestyle modification, strategies to improve health and well being; and in coordination of care. SEE ABOVE FOR DETAILS There are no diagnoses linked to this encounter.   Education and routine counseling performed. Handouts provided  Gross side effects, risk and benefits, and alternatives of medications and treatment plan in general discussed with patient.  Patient is aware that all medications have potential side effects and we are unable to predict every side effect  or drug-drug interaction that may occur.   Patient will call with any questions prior to using medication if they have concerns.  Expresses verbal understanding and consents to current therapy and treatment regimen.  No barriers to understanding were identified.  Red flag symptoms and signs discussed in detail.  Patient expressed understanding regarding what to do in case of emergency\urgent symptoms  Please see AVS handed out to patient at the end of our visit for further patient instructions/ counseling done pertaining to today's office visit.   Return for 2 mo- f/up chronic cough/ laryngeal spasms/ haorse.     Note: This document was prepared using Dragon voice recognition software and may include unintentional dictation errors.  Mellody Dance 2:43 PM --------------------------------------------------------------------------------------------------------------------------------------------------------------------------------------------------------------------------------------------    Subjective:    CC:  Chief Complaint  Patient presents with  . Medication Refill    HPI: Patricia [name] is a 76 y.o. female who presents to [place] at [facility] today for issues as discussed below.    Cough starts as a tremendous tickle in throat- makes her hoarse.   Problem  Persistent Atrial Fibrillation (Hcc)   7-8 yrs now- not sure what precipitated it.  Caused CVA-    Chronic Diastolic Heart Failure (Hcc)   EF 60% by echo 04/2015   Cva (Cerebral Vascular Accident) (Hcc)   10 yrs ago--> after Afib    Vitamin D Deficiency     Wt Readings from Last 3 Encounters:  01/27/17 130 lb (59 kg)  01/06/17 129 lb 14.4 oz (58.9 kg)  12/02/16 133 lb (60.3 kg)   BP Readings from Last 3 Encounters:  01/27/17 133/90  01/06/17 119/76  12/02/16 116/64   BMI Readings from Last 3 Encounters:  01/27/17 26.26 kg/m  01/06/17 26.24 kg/m  12/02/16 26.86 kg/m     Patient  Care Team    Relationship Specialty Notifications Start End  Mellody Dance, DO PCP - General Family Medicine  09/08/16   Thea Silversmith, MD (Inactive) Consulting Physician Radiation Oncology  11/29/14   Magrinat, Virgie Dad, MD Consulting Physician Oncology  11/29/14   Stark Klein, MD Consulting Physician General Surgery  11/29/14   Sylvan Cheese, NP Nurse Practitioner Nurse Practitioner  11/29/14    Comment: Survivorship  Ladene Artist, MD Consulting Physician Gastroenterology  02/14/15   Sueanne Margarita, MD Consulting Physician Cardiology  09/08/16   Marygrace Drought, MD Consulting Physician Ophthalmology  01/06/17    Comment: Sees him for glaucoma     Patient Active Problem List   Diagnosis Date Noted  . Prediabetes 01/06/2017    Priority: High  . Persistent atrial fibrillation (HCC)     Priority: High  . Chronic diastolic heart failure ([place]) 06/17/2015    Priority: High  . CVA (cerebral vascular accident) ([place]) 04/30/2015    Priority: High  . Lung cancer, lingula ([place]) 09/08/2016    Priority: Medium  . Malignant neoplasm of upper-outer quadrant of left breast in female, estrogen receptor positive ([place]) 06/08/2013    Priority: Medium  . Vitamin D deficiency 01/06/2017  . Postmenopausal bone loss 01/06/2017  . CKD (chronic kidney disease), stage III ([place]) 10/30/2016  . History of CVA (cerebrovascular accident) 05/14/2016  . Abnormal PFT 10/24/2015  . Aortic regurgitation 06/17/2015  . Essential hypertension 06/17/2015  . CVA (cerebral infarction)   . Pulmonary edema 04/27/2015  . 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 Multinodular goiter 01/11/2013  . 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 General medical examination 06/09/2010  . Screening for malignant neoplasm of the cervix 06/09/2010  . ESOPHAGEAL STRICTURE 09/26/2009  . GERD 09/26/2009  . HEARING LOSS, UNSPEC. 08/15/2009  . Hyperlipidemia 06/10/2009  . Diverticulosis of large intestine 06/10/2009  .  GLAUCOMA, BILATERAL 02/07/2009  . COUGH VARIANT ASTHMA 06/27/2007  . Osteopenia 05/28/2007  . STRICTURE AND STENOSIS OF CERVIX 05/06/2007  . POSTMENOPAUSAL STATUS 05/06/2007  . Chronic cough 01/21/2007  . LUNG CANCER, HX OF 07/29/2006    Past Medical history, Surgical history, Family history, Social history, Allergies and Medications have been entered into the medical record, reviewed and changed as needed.    No outpatient medications have been marked as taking for the 01/27/17 encounter (Office Visit) with Mellody Dance, DO.    Allergies:  Allergies  Allergen Reactions  . Combigan [Brimonidine Tartrate-Timolol] Itching    Eyes itch, reddened  . Other Other (See Comments)    Per patient made OU red, Sore, and sensitivity to light  . Sulfa Antibiotics Rash  . Sulfamethoxazole-Trimethoprim Hives     Review of Systems: General:   Denies fever, chills, unexplained weight loss.  Optho/Auditory:   Denies visual changes, blurred vision/LOV Respiratory:   Denies wheeze, DOE more than baseline levels.  Cardiovascular:   Denies chest pain, palpitations, new onset peripheral edema  Gastrointestinal:   Denies nausea, vomiting, diarrhea, abd pain.  Genitourinary: Denies dysuria, freq/ urgency, flank pain or discharge from genitals.  Endocrine:     Denies hot or cold intolerance, polyuria, polydipsia. Musculoskeletal:   Denies unexplained  myalgias, joint swelling, unexplained arthralgias, gait problems.  Skin:  Denies new onset rash, suspicious lesions Neurological:     Denies dizziness, unexplained weakness, numbness  Psychiatric/Behavioral:   Denies mood changes, suicidal or homicidal ideations, hallucinations    Objective:   Blood pressure 133/90, pulse 75, height 4\' 11"  (1.499 m), weight 130 lb (59 kg). Body mass index is 26.26 kg/m. General:  Well Developed, well nourished, appropriate for stated age.  Neuro:  Alert and oriented,  extra-ocular muscles intact  HEENT:   Normocephalic, atraumatic, neck supple Skin:  no gross rash, warm, pink. Cardiac:  RRR, S1 S2 Respiratory:  ECTA B/L and A/P, Not using accessory muscles, speaking in full sentences- unlabored. Vascular:  Ext warm, no cyanosis apprec.; cap RF less 2 sec. Psych:  No HI/SI, judgement and insight good, Euthymic mood. Full Affect.

## 2017-03-04 ENCOUNTER — Other Ambulatory Visit: Payer: Self-pay | Admitting: Nurse Practitioner

## 2017-03-08 ENCOUNTER — Encounter: Payer: Self-pay | Admitting: Family Medicine

## 2017-03-08 ENCOUNTER — Ambulatory Visit (INDEPENDENT_AMBULATORY_CARE_PROVIDER_SITE_OTHER): Payer: Medicare Other | Admitting: Family Medicine

## 2017-03-08 VITALS — BP 130/90 | HR 99 | Ht 59.0 in | Wt 129.6 lb

## 2017-03-08 DIAGNOSIS — R7303 Prediabetes: Secondary | ICD-10-CM

## 2017-03-08 DIAGNOSIS — R05 Cough: Secondary | ICD-10-CM

## 2017-03-08 DIAGNOSIS — R413 Other amnesia: Secondary | ICD-10-CM

## 2017-03-08 DIAGNOSIS — R053 Chronic cough: Secondary | ICD-10-CM

## 2017-03-08 DIAGNOSIS — J385 Laryngeal spasm: Secondary | ICD-10-CM

## 2017-03-08 LAB — POCT GLYCOSYLATED HEMOGLOBIN (HGB A1C): Hemoglobin A1C: 6.3

## 2017-03-08 NOTE — Progress Notes (Signed)
Impression and Recommendations:    1. Prediabetes   2. Cough, persistent   3. Laryngospasms   4. Memory changes     Prediabetes - A1c is 6.3 today  1 year ago: 5.7  4 months ago: 6.0 - The nature of diabetes was reviewed and discussed in detail with the patient. Prediabetes educational materials provided. - Prediabetes diet recommended, low glycemic index foods, less breads. Educational materials provided on prediabetic food consumption. - 150 minutes of cardiovascular exercise per week recommended per AHA guidelines. Patient knows to work toward this goal, starting with about 15 minutes a day. - We will check her A1c again in 3 months.  Persistent Cough & Laryngospasms - Reflux was discussed in detail with the patient today. - continues taking 1 omeprazole every day, with symptoms much improved. Persistent chronic cough and laryngopasms have decreased in severity and frequency. - If symptoms worsen again and further treatment is needed, patient knows to look into taking Zantac OTC 150 mg twice daily in addition to omeprazole.  Memory Concerns - Dementia screen administered today to help evaluate memory loss- was WNL"s for age/ education level. - Cont to monitor sx - Educational materials provided today on dementia vs age-related memory loss given the patient from Alzheimer's Association website - Exercise and mental stimulation (learning a new language or instrument), along with a healthy diet with fruits and vegetables, was encouraged as a preventative measure for memory loss.   Orders Placed This Encounter  Procedures  . POCT glycosylated hemoglobin (Hb A1C)    Gross side effects, risk and benefits, and alternatives of medications and treatment plan in general discussed with patient.  Patient is aware that all medications have potential side effects and we are unable to predict every side effect or drug-drug interaction that may occur.   Patient will call with any  questions prior to using medication if they have concerns.  Expresses verbal understanding and consents to current therapy and treatment regimen.  No barriers to understanding were identified.  Red flag symptoms and signs discussed in detail.  Patient expressed understanding regarding what to do in case of emergency\urgent symptoms  Please see AVS handed out to patient at the end of our visit for further patient instructions/ counseling done pertaining to today's office visit.   Return in about 3 months (around 06/06/2017) for check A1c.    Note: This note was prepared with assistance of Dragon voice recognition software. Occasional wrong-word or sound-a-like substitutions may have occurred due to the inherent limitations of voice recognition software.   This document serves as a record of services personally performed by Mellody Dance, DO. It was created on her behalf by [name], a trained medical scribe. The creation of this record is based on the scribe's personal observations and the provider's statements to them.   I have reviewed the above medical documentation for accuracy and completeness and I concur.  Mellody Dance 03/08/17 4:06 PM   --------------------------------------------------------------------------------------------------------------------------------------------------------------------------------------------------------------------------------------------    Subjective:     HPI: Patricia [name] is a 77 y.o. female who presents to [place] at [facility] today for issues as discussed below.  Since her last visit, regarding her chronic cough laryngospasms, she believes her cough has decreased and everything is going well. She continues taking one omeprazole every day, and believes that her symptoms are now very manageable.  She did state she still does not believe in silent reflux though.  Pt further notes that drinking  warm water helps to help  alleviate her symptoms, with ice water exacerbating her symptoms.  She declines need for any further management or treatment.  She does know certain trigger foods do make her symptoms worse  Had a cold that started 3 days ago, but notes that it is "almost gone." As a result, she has post-nasal drip and "throat stuff," and is taking Cold-EEZE for relief, noting that "for some reason, zinc seems to help."  She notes that her husband says that he's concerned about her memory. He says that she does not remember certain things she tells him.  They also had a family friend who also also stated to her husband he was concerned about her as well.    6CIT Screen 03/08/2017  What Year? 0 points  What month? 0 points  What time? 0 points  Count back from 20 0 points  Months in reverse 0 points  Repeat phrase 2 points  Total Score 2   Denies hearing problems.  Wt Readings from Last 3 Encounters:  03/08/17 129 lb 9.6 oz (58.8 kg)  01/27/17 130 lb (59 kg)  01/06/17 129 lb 14.4 oz (58.9 kg)   BP Readings from Last 3 Encounters:  03/08/17 130/90  01/27/17 133/90  01/06/17 119/76   Pulse Readings from Last 3 Encounters:  03/08/17 99  01/27/17 75  01/06/17 77   BMI Readings from Last 3 Encounters:  03/08/17 26.18 kg/m  01/27/17 26.26 kg/m  01/06/17 26.24 kg/m     Patient Care Team    Relationship Specialty Notifications Start End  Mellody Dance, DO PCP - General Family Medicine  09/08/16   Thea Silversmith, MD (Inactive) Consulting Physician Radiation Oncology  11/29/14   Magrinat, Virgie Dad, MD Consulting Physician Oncology  11/29/14   Stark Klein, MD Consulting Physician General Surgery  11/29/14   Sylvan Cheese, NP Nurse Practitioner Nurse Practitioner  11/29/14    Comment: Survivorship  Ladene Artist, MD Consulting Physician Gastroenterology  02/14/15   Sueanne Margarita, MD Consulting Physician Cardiology  09/08/16   Marygrace Drought, MD Consulting Physician Ophthalmology   01/06/17    Comment: Sees him for glaucoma     Patient Active Problem List   Diagnosis Date Noted  . Prediabetes 01/06/2017    Priority: High  . CKD (chronic kidney disease), stage III ([place]) 10/30/2016    Priority: High  . Persistent atrial fibrillation (HCC)     Priority: High  . Chronic diastolic heart failure ([place]) 06/17/2015    Priority: High  . Essential hypertension 06/17/2015    Priority: High  . CVA (cerebral vascular accident) ([place]) 04/30/2015    Priority: High  . Hyperlipidemia 06/10/2009    Priority: High  . Lung cancer, lingula ([place]) 09/08/2016    Priority: Medium  . 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 Malignant neoplasm of upper-outer quadrant of left breast in female, estrogen receptor positive ([place]) 06/08/2013    Priority: Medium  . Multinodular goiter 01/11/2013    Priority: Medium  . 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 ESOPHAGEAL STRICTURE 09/26/2009    Priority: Medium  . GERD 09/26/2009    Priority: Medium  . Cough variant asthma 06/27/2007    Priority: Medium  . Chronic cough 01/21/2007    Priority: Medium  . LUNG CANCER, HX OF 07/29/2006    Priority: Medium  . Arthritis 01/27/2017    Priority: Low  . Vitamin D deficiency 01/06/2017  Priority: Low  . Postmenopausal bone loss 01/06/2017    Priority: Low  . Osteopenia 05/28/2007    Priority: Low  . History of CVA (cerebrovascular accident) 05/14/2016  . Abnormal PFT 10/24/2015  . Aortic regurgitation 06/17/2015  . CVA (cerebral infarction)   . Pulmonary edema 04/27/2015  . General medical examination 06/09/2010  . Screening for malignant neoplasm of the cervix 06/09/2010  . HEARING LOSS, UNSPEC. 08/15/2009  . Diverticulosis of large intestine 06/10/2009  . GLAUCOMA, BILATERAL 02/07/2009  . STRICTURE AND STENOSIS OF CERVIX 05/06/2007  . POSTMENOPAUSAL STATUS 05/06/2007    Past Medical history, Surgical history, Family history, Social history,  Allergies and Medications have been entered into the medical record, reviewed and changed as needed.    Current Meds  Medication Sig  . albuterol (PROVENTIL HFA;VENTOLIN HFA) 108 (90 Base) MCG/ACT inhaler Inhale 2 puffs into the lungs every 6 (six) hours as needed for wheezing or shortness of breath.  . anastrozole (ARIMIDEX) 1 MG tablet Take 1 tablet (1 mg total) by mouth daily.  Marland Kitchen atorvastatin (LIPITOR) 40 MG tablet TAKE 1 TABLET DAILY  . digoxin (LANOXIN) 0.125 MG tablet TAKE 1 TABLET DAILY  . diltiazem (CARDIZEM CD) 180 MG 24 hr capsule TAKE 1 CAPSULE DAILY  . ELIQUIS 5 MG TABS tablet TAKE 1 TABLET TWICE A DAY  . fluticasone-salmeterol (ADVAIR HFA) 230-21 MCG/ACT inhaler USE 2 INHALATIONS TWICE A DAY  . furosemide (LASIX) 20 MG tablet TAKE 2 TABLETS EVERY MORNING AND 1 TABLET EVERY EVENING  . gabapentin (NEURONTIN) 400 MG capsule Take 1 capsule (400 mg total) by mouth 2 (two) times daily.  Marland Kitchen loratadine (CLARITIN) 10 MG tablet Take 10 mg by mouth daily as needed for allergies. Reported on 09/16/2015  . Multiple Vitamins-Iron (MULTIVITAMIN/IRON) TABS Take 1 tablet by mouth daily.   Marland Kitchen omeprazole (PRILOSEC) 20 MG capsule TAKE 1 CAPSULE 30 MINUTES BEFORE A MEAL, BID ( before lunch and dinner)  . potassium chloride SA (K-DUR,KLOR-CON) 20 MEQ tablet Take 1 tablet (20 mEq total) by mouth 2 (two) times daily.  Marland Kitchen thyroid (ARMOUR THYROID) 60 MG tablet Take 1 tablet (60 mg total) by mouth daily before breakfast.  . traMADol (ULTRAM) 50 MG tablet Take 1 tablet (50 mg total) by mouth every 6 (six) hours as needed. OV prior to RF  . Vitamin D, Ergocalciferol, (DRISDOL) 50000 units CAPS capsule Take 1 capsule (50,000 Units total) every 7 (seven) days by mouth.    Allergies:  Allergies  Allergen Reactions  . Combigan [Brimonidine Tartrate-Timolol] Itching    Eyes itch, reddened  . Other Other (See Comments)    Per patient made OU red, Sore, and sensitivity to light  . Sulfa Antibiotics Rash  .  Sulfamethoxazole-Trimethoprim Hives    Review of Systems:  A fourteen system review of systems was performed and found to be positive as per HPI.   Objective:   Blood pressure 130/90, pulse 99, height 4\' 11"  (1.499 m), weight 129 lb 9.6 oz (58.8 kg), SpO2 98 %. Body mass index is 26.18 kg/m. General:  Well Developed, well nourished, appropriate for stated age.  Neuro:  Alert and oriented,  extra-ocular muscles intact  HEENT: Erythematous posterior oropharynx consistent with PND. Normocephalic, atraumatic, neck supple, no LAD apprec cervical region Skin:  no gross rash, warm, pink. Cardiac:  RRR, S1 S2 Respiratory:  ECTA B/L and A/P, Not using accessory muscles, speaking in full sentences- unlabored. Vascular:  Ext warm, no cyanosis apprec.; cap RF less 2 sec. Psych:  No HI/SI, judgement and insight good, Euthymic mood. Full Affect.

## 2017-03-08 NOTE — Patient Instructions (Addendum)
So glad to hear the your symptoms have improved very much on the omeprazole daily.  However if the symptoms do flare up you can always take Zantac over-the-counter 150 mg twice daily in addition to your omeprazole.   Heartburn Heartburn is a type of pain or discomfort that can happen in the throat or chest. It is often described as a burning pain. It may also cause a bad taste in the mouth. Heartburn may feel worse when you lie down or bend over, and it is often worse at night. Heartburn may be caused by stomach contents that move back up into the esophagus (reflux). Follow these instructions at home: Take these actions to decrease your discomfort and to help avoid complications. Diet  Follow a diet as recommended by your health care provider. This may involve avoiding foods and drinks such as: ? Coffee and tea (with or without caffeine). ? Drinks that contain alcohol. ? Energy drinks and sports drinks. ? Carbonated drinks or sodas. ? Chocolate and cocoa. ? Peppermint and mint flavorings. ? Garlic and onions. ? Horseradish. ? Spicy and acidic foods, including peppers, chili powder, curry powder, vinegar, hot sauces, and barbecue sauce. ? Citrus fruit juices and citrus fruits, such as oranges, lemons, and limes. ? Tomato-based foods, such as red sauce, chili, salsa, and pizza with red sauce. ? Fried and fatty foods, such as donuts, french fries, potato chips, and high-fat dressings. ? High-fat meats, such as hot dogs and fatty cuts of red and white meats, such as rib eye steak, sausage, ham, and bacon. ? High-fat dairy items, such as whole milk, butter, and cream cheese.  Eat small, frequent meals instead of large meals.  Avoid drinking large amounts of liquid with your meals.  Avoid eating meals during the 2-3 hours before bedtime.  Avoid lying down right after you eat.  Do not exercise right after you eat. General instructions  Pay attention to any changes in your  symptoms.  Take over-the-counter and prescription medicines only as told by your health care provider. Do not take aspirin, ibuprofen, or other NSAIDs unless your health care provider told you to do so.  Do not use any tobacco products, including cigarettes, chewing tobacco, and e-cigarettes. If you need help quitting, ask your health care provider.  Wear loose-fitting clothing. Do not wear anything tight around your waist that causes pressure on your abdomen.  Raise (elevate) the head of your bed about 6 inches (15 cm).  Try to reduce your stress, such as with yoga or meditation. If you need help reducing stress, ask your health care provider.  If you are overweight, reduce your weight to an amount that is healthy for you. Ask your health care provider for guidance about a safe weight loss goal.  Keep all follow-up visits as told by your health care provider. This is important. Contact a health care provider if:  You have new symptoms.  You have unexplained weight loss.  You have difficulty swallowing, or it hurts to swallow.  You have wheezing or a persistent cough.  Your symptoms do not improve with treatment.  You have frequent heartburn for more than two weeks. Get help right away if:  You have pain in your arms, neck, jaw, teeth, or back.  You feel sweaty, dizzy, or light-headed.  You have chest pain or shortness of breath.  You vomit and your vomit looks like blood or coffee grounds.  Your stool is bloody or black. This information is not intended  to replace advice given to you by your health care provider. Make sure you discuss any questions you have with your health care provider. Document Released: 07/05/2008 Document Revised: 07/25/2015 Document Reviewed: 06/13/2014 Elsevier Interactive Patient Education  2018 [place] factors for prediabetes and type 2 diabetes  Researchers don't fully understand why some people develop prediabetes and type 2  diabetes and others don't.  It's clear that certain factors increase the risk, however, including:  Weight. The more fatty tissue you have, the more resistant your cells become to insulin.  Inactivity. The less active you are, the greater your risk. Physical activity helps you control your weight, uses up glucose as energy and makes your cells more sensitive to insulin.  Family history. Your risk increases if a parent or sibling has type 2 diabetes.  Race. Although it's unclear why, people of certain races - including blacks, Hispanics, American Indians and Asian-Americans - are at higher risk.  Age. Your risk increases as you get older. This may be because you tend to exercise less, lose muscle mass and gain weight as you age. But type 2 diabetes is also increasing dramatically among children, adolescents and younger adults.  Gestational diabetes. If you developed gestational diabetes when you were pregnant, your risk of developing prediabetes and type 2 diabetes later increases. If you gave birth to a baby weighing more than 9 pounds (4 kilograms), you're also at risk of type 2 diabetes.  Polycystic ovary syndrome. For women, having polycystic ovary syndrome - a common condition characterized by irregular menstrual periods, excess hair growth and obesity - increases the risk of diabetes.  High blood pressure. Having blood pressure over 140/90 millimeters of mercury (mm Hg) is linked to an increased risk of type 2 diabetes.  Abnormal cholesterol and triglyceride levels. If you have low levels of high-density lipoprotein (HDL), or "good," cholesterol, your risk of type 2 diabetes is higher. Triglycerides are another type of fat carried in the blood. People with high levels of triglycerides have an increased risk of type 2 diabetes. Your doctor can let you know what your cholesterol and triglyceride levels are.  A good guide to good carbs: The glycemic index ---If you have diabetes, or at risk for  diabetes, you know all too well that when you eat carbohydrates, your blood sugar goes up. The total amount of carbs you consume at a meal or in a snack mostly determines what your blood sugar will do. But the food itself also plays a role. A serving of white rice has almost the same effect as eating pure table sugar - a quick, high spike in blood sugar. A serving of lentils has a slower, smaller effect.  ---Picking good sources of carbs can help you control your blood sugar and your weight. Even if you don't have diabetes, eating healthier carbohydrate-rich foods can help ward off a host of chronic conditions, from heart disease to various cancers to, well, diabetes.  ---One way to choose foods is with the glycemic index (GI). This tool measures how much a food boosts blood sugar.  The glycemic index rates the effect of a specific amount of a food on blood sugar compared with the same amount of pure glucose. A food with a glycemic index of 28 boosts blood sugar only 28% as much as pure glucose. One with a GI of 95 acts like pure glucose.   High glycemic foods result in a quick spike in insulin and blood sugar (  also known as blood glucose).  Low glycemic foods have a slower, smaller effect- these are healthier for you.   Using the glycemic index Using the glycemic index is easy: choose foods in the low GI category instead of those in the high GI category (see below), and go easy on those in between. Low glycemic index (GI of 55 or less): Most fruits and vegetables, beans, minimally processed grains, pasta, low-fat dairy foods, and nuts.  Moderate glycemic index (GI 56 to 69): White and sweet potatoes, corn, white rice, couscous, breakfast cereals such as Cream of Wheat and Mini Wheats.  High glycemic index (GI of 70 or higher): White bread, rice cakes, most crackers, bagels, cakes, doughnuts, croissants, most packaged breakfast cereals. You can see the values for 100 commons foods and get links to more  at www.health.CheapToothpicks.si.  Swaps for lowering glycemic index  Instead of this high-glycemic index food Eat this lower-glycemic index food  White rice Brown rice or converted rice  Instant oatmeal Steel-cut oats  Cornflakes Bran flakes  Baked potato Pasta, bulgur  White bread Whole-grain bread  Corn Peas or leafy greens       Prediabetes Eating Plan  Prediabetes--also called impaired glucose tolerance or impaired fasting glucose--is a condition that causes blood sugar (blood glucose) levels to be higher than normal. Following a healthy diet can help to keep prediabetes under control. It can also help to lower the risk of type 2 diabetes and heart disease, which are increased in people who have prediabetes. Along with regular exercise, a healthy diet:  Promotes weight loss.  Helps to control blood sugar levels.  Helps to improve the way that the body uses insulin.   WHAT DO I NEED TO KNOW ABOUT THIS EATING PLAN?   Use the glycemic index (GI) to plan your meals. The index tells you how quickly a food will raise your blood sugar. Choose low-GI foods. These foods take a longer time to raise blood sugar.  Pay close attention to the amount of carbohydrates in the food that you eat. Carbohydrates increase blood sugar levels.  Keep track of how many calories you take in. Eating the right amount of calories will help you to achieve a healthy weight. Losing about 7 percent of your starting weight can help to prevent type 2 diabetes.  You may want to follow a Mediterranean diet. This diet includes a lot of vegetables, lean meats or fish, whole grains, fruits, and healthy oils and fats.   WHAT FOODS CAN I EAT?  Grains Whole grains, such as whole-wheat or whole-grain breads, crackers, cereals, and pasta. Unsweetened oatmeal. Bulgur. Barley. Quinoa. Brown rice. Corn or whole-wheat flour tortillas or taco shells. Vegetables Lettuce. Spinach. Peas. Beets. Cauliflower. Cabbage.  Broccoli. Carrots. Tomatoes. Squash. Eggplant. Herbs. Peppers. Onions. Cucumbers. Brussels sprouts. Fruits Berries. Bananas. Apples. Oranges. Grapes. Papaya. Mango. Pomegranate. Kiwi. Grapefruit. Cherries. Meats and Other Protein Sources Seafood. Lean meats, such as chicken and [place] or lean cuts of pork and beef. Tofu. Eggs. Nuts. Beans. Dairy Low-fat or fat-free dairy products, such as yogurt, cottage cheese, and cheese. Beverages Water. Tea. Coffee. Sugar-free or diet soda. Seltzer water. Milk. Milk alternatives, such as soy or almond milk. Condiments Mustard. Relish. Low-fat, low-sugar ketchup. Low-fat, low-sugar barbecue sauce. Low-fat or fat-free mayonnaise. Sweets and Desserts Sugar-free or low-fat pudding. Sugar-free or low-fat ice cream and other frozen treats. Fats and Oils Avocado. Walnuts. Olive oil. The items listed above may not be a complete list of recommended foods or  beverages. Contact your dietitian for more options.    WHAT FOODS ARE NOT RECOMMENDED?  Grains Refined white flour and flour products, such as bread, pasta, snack foods, and cereals. Beverages Sweetened drinks, such as sweet iced tea and soda. Sweets and Desserts Baked goods, such as cake, cupcakes, pastries, cookies, and cheesecake. The items listed above may not be a complete list of foods and beverages to avoid. Contact your dietitian for more information.   This information is not intended to replace advice given to you by your health care provider. Make sure you discuss any questions you have with your health care provider.   Document Released: 07/03/2014 Document Reviewed: 07/03/2014 Elsevier Interactive Patient Education [employer].

## 2017-03-12 ENCOUNTER — Other Ambulatory Visit: Payer: Self-pay | Admitting: Family Medicine

## 2017-03-12 DIAGNOSIS — I10 Essential (primary) hypertension: Secondary | ICD-10-CM

## 2017-03-12 DIAGNOSIS — I5032 Chronic diastolic (congestive) heart failure: Secondary | ICD-10-CM

## 2017-03-12 DIAGNOSIS — I4819 Other persistent atrial fibrillation: Secondary | ICD-10-CM

## 2017-03-12 DIAGNOSIS — I351 Nonrheumatic aortic (valve) insufficiency: Secondary | ICD-10-CM

## 2017-03-19 IMAGING — CR DG CHEST 2V
2 series · 2 of 2 positions shown · non-contrast
Comparison: Chest radiograph performed 05/09/2015

CLINICAL DATA: Acute onset of cough and shortness of breath. Atrial
fibrillation. Initial encounter.

EXAM:
CHEST  2 VIEW

[chest pa]
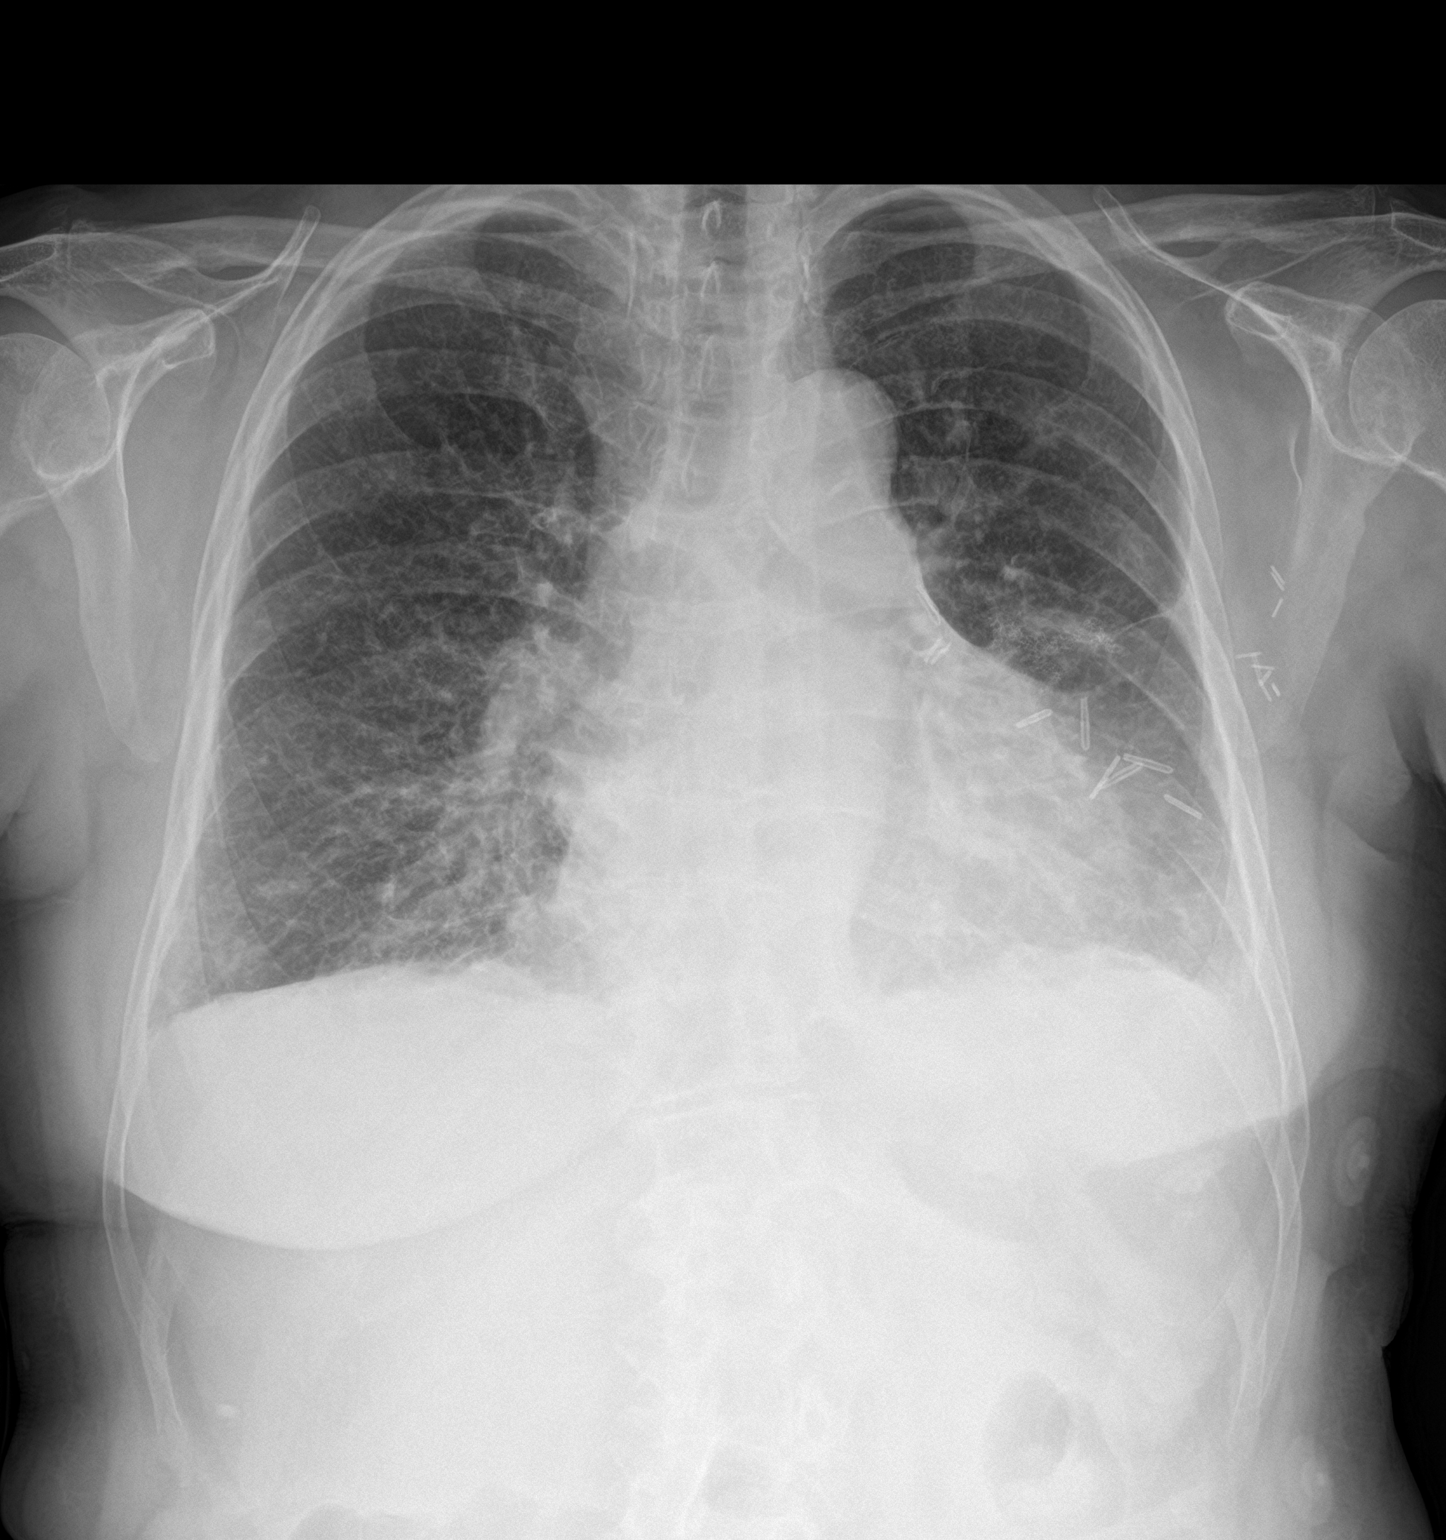

[chest lat]
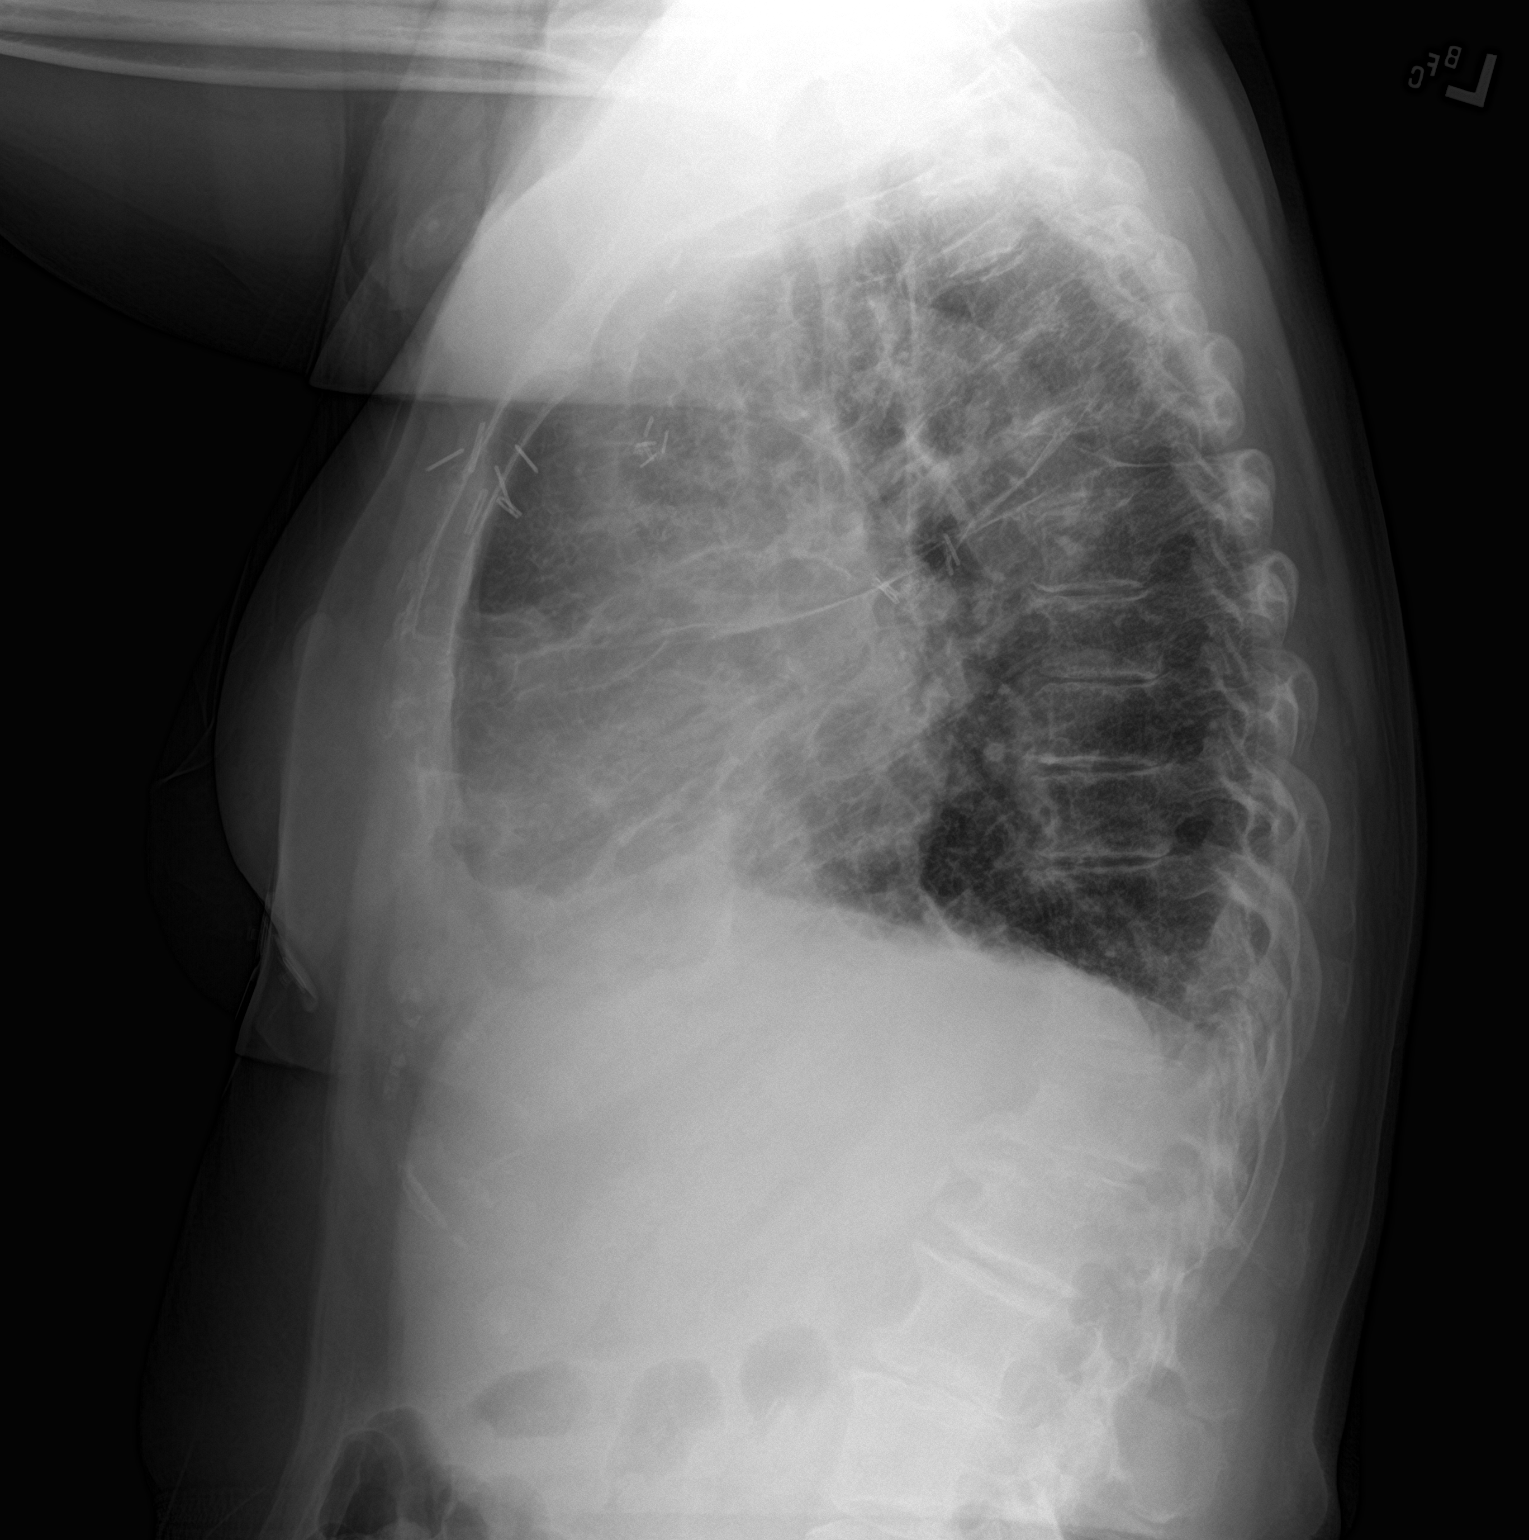

[2 of 2 positions shown; findings below may reference images not displayed]

FINDINGS: The lungs are well-aerated. Vascular congestion is noted. Increased
interstitial markings raise concern for pulmonary edema. A small
left pleural effusion is noted. There is no evidence of
pneumothorax.

The heart is borderline normal in size. No acute osseous
abnormalities are seen. Postoperative change is noted overlying the
left chest wall.
IMPRESSION: Vascular congestion noted. Increased interstitial markings raise
concern for pulmonary edema. Small left pleural effusion noted.

## 2017-03-24 ENCOUNTER — Other Ambulatory Visit: Payer: Self-pay | Admitting: *Deleted

## 2017-03-24 DIAGNOSIS — Z170 Estrogen receptor positive status [ER+]: Principal | ICD-10-CM

## 2017-03-24 DIAGNOSIS — C50412 Malignant neoplasm of upper-outer quadrant of left female breast: Secondary | ICD-10-CM

## 2017-03-25 ENCOUNTER — Inpatient Hospital Stay: Payer: Medicare Other

## 2017-03-25 ENCOUNTER — Other Ambulatory Visit: Payer: Self-pay | Admitting: Oncology

## 2017-03-25 ENCOUNTER — Inpatient Hospital Stay: Payer: Medicare Other | Attending: Oncology

## 2017-03-25 VITALS — BP 116/65 | HR 88 | Temp 97.7°F | Resp 18

## 2017-03-25 DIAGNOSIS — M8580 Other specified disorders of bone density and structure, unspecified site: Secondary | ICD-10-CM | POA: Diagnosis not present

## 2017-03-25 DIAGNOSIS — C50412 Malignant neoplasm of upper-outer quadrant of left female breast: Secondary | ICD-10-CM | POA: Insufficient documentation

## 2017-03-25 DIAGNOSIS — Z170 Estrogen receptor positive status [ER+]: Secondary | ICD-10-CM | POA: Insufficient documentation

## 2017-03-25 LAB — CBC WITH DIFFERENTIAL (CANCER CENTER ONLY)
BASOS PCT: 0 %
Basophils Absolute: 0 10*3/uL (ref 0.0–0.1)
EOS ABS: 0 10*3/uL (ref 0.0–0.5)
Eosinophils Relative: 0 %
HEMATOCRIT: 46 % (ref 34.8–46.6)
HEMOGLOBIN: 15.7 g/dL (ref 11.6–15.9)
Lymphocytes Relative: 28 %
Lymphs Abs: 1.7 10*3/uL (ref 0.9–3.3)
MCH: 32.3 pg (ref 25.1–34.0)
MCHC: 34.1 g/dL (ref 31.5–36.0)
MCV: 94.7 fL (ref 79.5–101.0)
Monocytes Absolute: 0.5 10*3/uL (ref 0.1–0.9)
Monocytes Relative: 8 %
NEUTROS ABS: 3.9 10*3/uL (ref 1.5–6.5)
NEUTROS PCT: 64 %
Platelet Count: 261 10*3/uL (ref 145–400)
RBC: 4.86 MIL/uL (ref 3.70–5.45)
RDW: 12.9 % (ref 11.2–16.1)
WBC Count: 6 10*3/uL (ref 3.9–10.3)

## 2017-03-25 LAB — CMP (CANCER CENTER ONLY)
ALK PHOS: 122 U/L (ref 40–150)
ALT: 28 U/L (ref 0–55)
ANION GAP: 10 (ref 3–11)
AST: 19 U/L (ref 5–34)
Albumin: 3.8 g/dL (ref 3.5–5.0)
BILIRUBIN TOTAL: 0.7 mg/dL (ref 0.2–1.2)
BUN: 17 mg/dL (ref 7–26)
CALCIUM: 9.5 mg/dL (ref 8.4–10.4)
CO2: 29 mmol/L (ref 22–29)
CREATININE: 1.04 mg/dL (ref 0.60–1.10)
Chloride: 105 mmol/L (ref 98–109)
GFR, EST AFRICAN AMERICAN: 59 mL/min — AB (ref >60)
GFR, Estimated: 51 mL/min — ABNORMAL LOW (ref >60)
Glucose, Bld: 117 mg/dL (ref 70–140)
Potassium: 3.9 mmol/L (ref 3.3–4.7)
SODIUM: 144 mmol/L (ref 136–145)
TOTAL PROTEIN: 7.2 g/dL (ref 6.4–8.3)

## 2017-03-25 MED ORDER — DENOSUMAB 60 MG/ML ~~LOC~~ SOLN
60.0000 mg | Freq: Once | SUBCUTANEOUS | Status: AC
  Administered 2017-03-25: 60 mg via SUBCUTANEOUS

## 2017-04-02 ENCOUNTER — Telehealth: Payer: Self-pay | Admitting: Family Medicine

## 2017-04-02 NOTE — Telephone Encounter (Signed)
Patient is requesting a refill of her tramadol, if approved would like a small supply sent to [place] since it will take time for [facility] to get her the full amount. Please advise.

## 2017-04-05 ENCOUNTER — Other Ambulatory Visit: Payer: Self-pay

## 2017-04-05 DIAGNOSIS — J385 Laryngeal spasm: Secondary | ICD-10-CM

## 2017-04-05 DIAGNOSIS — R053 Chronic cough: Secondary | ICD-10-CM

## 2017-04-05 DIAGNOSIS — R05 Cough: Secondary | ICD-10-CM

## 2017-04-05 NOTE — Telephone Encounter (Signed)
Error. MPulliam, CMA/RT(R)

## 2017-04-05 NOTE — Telephone Encounter (Signed)
Patient called requesting a refill of her tramadol,  she would like a small supply sent to [facility] and full amount sent to [facility] to get her the full amount. Medication was last filled 01/27/2017 and per Dr. [name] patient needs an office visit for refills. Last office visit 03/08/2017.  I called the patient to notify that she needs an office visit.  Patient wants to verify that since she was in for office visit recently that she still needs to follow up for refills. Please advise. MPulliam, CMA/RT(R)

## 2017-04-06 ENCOUNTER — Encounter: Payer: Self-pay | Admitting: Gastroenterology

## 2017-04-06 ENCOUNTER — Telehealth: Payer: Self-pay | Admitting: Family Medicine

## 2017-04-06 NOTE — Telephone Encounter (Signed)
Sent request to Dr. [name] for review. MPulliam, CMA/RT(R)

## 2017-04-06 NOTE — Telephone Encounter (Signed)
Patient states provider called left msg for her to call office-- forwarding message to medical assistant that Patient returned phone call.  --glh

## 2017-04-06 NOTE — Telephone Encounter (Signed)
If we do not have a pain contract with patient, she will need to come in for office visit for me to document why she takes this medicine on a regular basis, and assess her pain etc.  She will also need to enter into a pain contract with [name] if she does not have one.

## 2017-04-06 NOTE — Telephone Encounter (Signed)
Per Dr. [name] patient will need to come in for a follow up appointment and to sign pain contract if this is needed for long term care. MPulliam, CMA/RT(R)

## 2017-04-07 NOTE — Telephone Encounter (Signed)
Called patient left message to call back. MPulliam, CMA/RT(R)

## 2017-04-07 NOTE — Telephone Encounter (Signed)
Please see pervious note. MPulliam, CMA/RT(R)

## 2017-04-09 NOTE — Telephone Encounter (Signed)
Patient has been called several times to make appointment, messages left for the patient to call the office. MPulliam, CMA/RT(R)

## 2017-04-09 NOTE — Telephone Encounter (Signed)
Called patient left message to call the office. MPulliam, CMA/RT(R)  

## 2017-04-12 ENCOUNTER — Telehealth: Payer: Self-pay

## 2017-04-12 MED ORDER — ANASTROZOLE 1 MG PO TABS: 1.0000 mg | ORAL_TABLET | Freq: Every day | ORAL | 3 refills | Status: DC

## 2017-04-12 NOTE — Telephone Encounter (Signed)
Prescription for anastrozole faxed to [facility].

## 2017-04-14 ENCOUNTER — Ambulatory Visit (INDEPENDENT_AMBULATORY_CARE_PROVIDER_SITE_OTHER): Payer: Medicare Other | Admitting: Family Medicine

## 2017-04-14 ENCOUNTER — Encounter: Payer: Self-pay | Admitting: Family Medicine

## 2017-04-14 ENCOUNTER — Other Ambulatory Visit: Payer: Self-pay | Admitting: *Deleted

## 2017-04-14 ENCOUNTER — Telehealth: Payer: Self-pay | Admitting: Family Medicine

## 2017-04-14 VITALS — BP 134/90 | HR 89 | Ht 59.0 in | Wt 133.0 lb

## 2017-04-14 DIAGNOSIS — I10 Essential (primary) hypertension: Secondary | ICD-10-CM

## 2017-04-14 DIAGNOSIS — Z85118 Personal history of other malignant neoplasm of bronchus and lung: Secondary | ICD-10-CM

## 2017-04-14 DIAGNOSIS — IMO0002 Reserved for concepts with insufficient information to code with codable children: Secondary | ICD-10-CM

## 2017-04-14 DIAGNOSIS — E7849 Other hyperlipidemia: Secondary | ICD-10-CM

## 2017-04-14 DIAGNOSIS — E559 Vitamin D deficiency, unspecified: Secondary | ICD-10-CM | POA: Diagnosis not present

## 2017-04-14 DIAGNOSIS — R7303 Prediabetes: Secondary | ICD-10-CM | POA: Diagnosis not present

## 2017-04-14 DIAGNOSIS — Z79899 Other long term (current) drug therapy: Secondary | ICD-10-CM

## 2017-04-14 DIAGNOSIS — Z170 Estrogen receptor positive status [ER+]: Secondary | ICD-10-CM

## 2017-04-14 DIAGNOSIS — R053 Chronic cough: Secondary | ICD-10-CM

## 2017-04-14 DIAGNOSIS — E032 Hypothyroidism due to medicaments and other exogenous substances: Secondary | ICD-10-CM

## 2017-04-14 DIAGNOSIS — N183 Chronic kidney disease, stage 3 unspecified: Secondary | ICD-10-CM

## 2017-04-14 DIAGNOSIS — M810 Age-related osteoporosis without current pathological fracture: Secondary | ICD-10-CM | POA: Diagnosis not present

## 2017-04-14 DIAGNOSIS — I5032 Chronic diastolic (congestive) heart failure: Secondary | ICD-10-CM | POA: Diagnosis not present

## 2017-04-14 DIAGNOSIS — C50412 Malignant neoplasm of upper-outer quadrant of left female breast: Secondary | ICD-10-CM

## 2017-04-14 DIAGNOSIS — M8580 Other specified disorders of bone density and structure, unspecified site: Secondary | ICD-10-CM

## 2017-04-14 DIAGNOSIS — I6389 Other cerebral infarction: Secondary | ICD-10-CM

## 2017-04-14 DIAGNOSIS — R05 Cough: Secondary | ICD-10-CM

## 2017-04-14 DIAGNOSIS — I481 Persistent atrial fibrillation: Secondary | ICD-10-CM | POA: Diagnosis not present

## 2017-04-14 DIAGNOSIS — IMO0001 Reserved for inherently not codable concepts without codable children: Secondary | ICD-10-CM | POA: Insufficient documentation

## 2017-04-14 DIAGNOSIS — I4819 Other persistent atrial fibrillation: Secondary | ICD-10-CM

## 2017-04-14 MED ORDER — ANASTROZOLE 1 MG PO TABS: 1.0000 mg | ORAL_TABLET | Freq: Every day | ORAL | 3 refills | Status: DC

## 2017-04-14 MED ORDER — THYROID 60 MG PO TABS
60.0000 mg | ORAL_TABLET | Freq: Every day | ORAL | 1 refills | Status: DC
Start: 2017-04-14 — End: 2017-10-23

## 2017-04-14 MED ORDER — OMEPRAZOLE 20 MG PO CPDR: DELAYED_RELEASE_CAPSULE | ORAL | 0 refills | Status: DC

## 2017-04-14 NOTE — Telephone Encounter (Signed)
Patient is requesting a new prescription for [place] Thyroid 60mg . If approved please send to [facility]

## 2017-04-14 NOTE — Telephone Encounter (Signed)
Medication sent into the pharmacy. MPulliam, CMA/RT(R)

## 2017-04-14 NOTE — Progress Notes (Signed)
Impression and Recommendations:    1. Chronic cough   2. Persistent atrial fibrillation ([place])   3. CKD (chronic kidney disease), stage III ([place])   4. Chronic diastolic heart failure ([place])   5. Malignant neoplasm of upper-outer quadrant of left breast in female, estrogen receptor positive ([place])   6. Prediabetes   7. Essential hypertension   8. Other hyperlipidemia   9. Vitamin D deficiency   10. Cerebrovascular accident (CVA) due to other mechanism   31. LUNG CANCER, HX OF   12. Postmenopausal bone loss   13. Osteopenia, unspecified location   14. High risk medication use   15. Hypothyroidism due to medication     1. Persistent Cough & Laryngospasms - Silent Reflux - Reviewed half-lives and the fact that the tramadol hasn't been in her system for several days.  Discontinuing the tramadol.  - Patient should continue taking her omeprazole twice daily as prescribed.  She should look for the chronic cough or the funny taste in her mouth as evidence of her reflux, since it is silent.  Reviewed that our goal is to treat the reflux before the cough returns.  - Noted that omeprazole has a slight tie to increased incidence of dementia, and our goal to limit the patient's use of it.  Eventually, the patient should go down to one omeprazole per day on those days she doesn't eat trigger foods.  If she's having a very light lunch or meal, she should try to avoid taking the omeprazole.  - Reviewed trigger foods for reflux: caffeine, spicy foods, citrusy foods, acetic acid, tomatoes, lemons, oranges, acidic foods, salsa, eating late at night, waiting at least 3 hours from when she last ate or drank something.  Particulraly recommended that the patient wait 3 hours before sleeping after the last thing she eats or drinks at night.  - If she knows she's going to have citrusy, tomatoes, sauce, [place] with salsa, then the patient should make sure she's very good about not eating or drinking within  3 hours of going to bed at night, and taking an extra heartburn pill at times she might be triggered.  - Scientist, clinical (histocompatibility and immunogenetics) on food choices for GERD supplied to the patient today.  A list of foods to avoid with reflux was also provided.  2. General Health - Generally reviewed the patient's last lab work.  It looked very good, especially given her history of chronic issues.    - We will continue monitoring the patient very closely given her history of CHF, lung cancer, breast cancer, stroke, chronic kidney disease, chronic anticoagulation, afib, etc.   3. Eye Health - Recommended that the patient go once a year for a dilated eye exam.  4. Osteopenia - To continue strengthening her bones, recommended getting on the machines and doing some weight lifting with very low weights.  5. Prediabetes - Reviewed the fact that we need to prevent her from becoming diabetic if at all possible.  A1C 2 years ago, A1C was 5.7 (January 2017). A1C back on 11/05/2016 was 6.0.   On 03/08/2017, her A1C was 6.3    - Patient should cut back on carbs, bread, rice, pasta, potatoes - sugary foods, sweets, candies.  - We will check A1C again in late March/early April with the goal of it being under 6.0.  6. Follow-Up - Non-fasting bloodwork in mid March.  Bloodwork will include CMP to check kidney function, vitamin D (history of vitamin D deficiency -  patient has been compliant with taking her weekly vitamin D), A1C.  - OV March 18-22 to follow up on blood work.   Meds ordered this encounter  Medications  . omeprazole (PRILOSEC) 20 MG capsule    Sig: TAKE 1 CAPSULE 30 MINUTES BEFORE A MEAL, BID ( before lunch and dinner)    Dispense:  180 capsule    Refill:  0  . thyroid (ARMOUR THYROID) 60 MG tablet    Sig: Take 1 tablet (60 mg total) by mouth daily before breakfast.    Dispense:  90 tablet    Refill:  1    Gross side effects, risk and benefits, and alternatives of medications and treatment plan in  general discussed with patient.  Patient is aware that all medications have potential side effects and we are unable to predict every side effect or drug-drug interaction that may occur.   Patient will call with any questions prior to using medication if they have concerns.  Expresses verbal understanding and consents to current therapy and treatment regimen.  No barriers to understanding were identified.  Red flag symptoms and signs discussed in detail.  Patient expressed understanding regarding what to do in case of emergency\urgent symptoms  Please see AVS handed out to patient at the end of our visit for further patient instructions/ counseling done pertaining to today's office visit.   Return for come in for bldwrk- A!C, cmp, vit D- not fasting  mid-March,  ov WITH ME mARCH 18-22nd.    Note: This note was prepared with assistance of Dragon voice recognition software. Occasional wrong-word or sound-a-like substitutions may have occurred due to the inherent limitations of voice recognition software.   This document serves as a record of services personally performed by Mellody Dance, DO. It was created on her behalf by [name], a trained medical scribe. The creation of this record is based on the scribe's personal observations and the provider's statements to them.   I have reviewed the above medical documentation for accuracy and completeness and I concur.  Mellody Dance 05/06/17 4:23 PM   -----------------------------------------------------------------------------------------------------------------------------------------------------------------------------------------------   Subjective:     HPI: Patricia [name] is a 77 y.o. female who presents to [place] at [facility] today for issues as discussed below.  Persistent Cough & Laryngospasms The patient returns to discuss her ongoing use of tramadol to control a cough.  Pulmonologist or ENT started her on  it in the past, due to a chronic cough she couldn't get rid of.  01/27/2017 was the last appointment at this clinic regarding her dry cough.  She describes the cough as starting as a tickle, which then became a cough she couldn't stop.  Warm water would be the only way she could stop it sometimes.    She ran out of tramadol about a week ago, but she hasn't been bothered by the cough since.  She has not noticed any trouble with pain in her body in the meantime.  In the meantime, she has been taking omeprazole consistently daily, and confirms that she's been taking it twice per day.  The cough hasn't been bothering her since taking the omeprazole daily.  She is obtaining her omeprazole via prescription.    She does still sometimes have a sour/metallic taste in the mornings.  She doesn't notice any kind of reflux or heartburn symptoms, "from what I understand of that, I've never noticed that."  She believes that any reflux she has is likely silent.  Heart Health / Cardiology Patient continues to see her heart specialist - has a follow-up appointment in March.  Notes that she follows up with the specialist every 6-8 months.  Osteopenia Had her last prolia shot about a month ago, with Dr. [name].  She does not continue to see radiation oncology.  Well-Being & Health Overall, she feels good.  She's still playing golf - she just played Tuesday.  When she plays golf, she walks, but has a powered pushcart.  Given her history of CHF, the patient denies swelling in her legs, no difficulties lying flat, denies pitting edema unless she eats a bunch of salty foods.  Given her history of afib, the patient denies dizziness, lightheadedness, chest pains - she gets dizzy if she "flips around too quickly."  No SOB or chest pain.    Wt Readings from Last 3 Encounters:  04/14/17 133 lb (60.3 kg)  03/08/17 129 lb 9.6 oz (58.8 kg)  01/27/17 130 lb (59 kg)   BP Readings from Last 3 Encounters:  04/14/17  134/90  03/25/17 116/65  03/08/17 130/90   Pulse Readings from Last 3 Encounters:  04/14/17 89  03/25/17 88  03/08/17 99   BMI Readings from Last 3 Encounters:  04/14/17 26.86 kg/m  03/08/17 26.18 kg/m  01/27/17 26.26 kg/m     Patient Care Team    Relationship Specialty Notifications Start End  Mellody Dance, DO PCP - General Family Medicine  09/08/16   Thea Silversmith, MD (Inactive) Consulting Physician Radiation Oncology  11/29/14   Magrinat, Virgie Dad, MD Consulting Physician Oncology  11/29/14   Stark Klein, MD Consulting Physician General Surgery  11/29/14   Sylvan Cheese, NP Nurse Practitioner Nurse Practitioner  11/29/14    Comment: Survivorship  Ladene Artist, MD Consulting Physician Gastroenterology  02/14/15   Sueanne Margarita, MD Consulting Physician Cardiology  09/08/16   Marygrace Drought, MD Consulting Physician Ophthalmology  01/06/17    Comment: Sees him for glaucoma     Patient Active Problem List   Diagnosis Date Noted  . Prediabetes 01/06/2017    Priority: High  . CKD (chronic kidney disease), stage III ([place]) 10/30/2016    Priority: High  . Persistent atrial fibrillation ([facility])     Priority: High  . Chronic diastolic heart failure ([place]) 06/17/2015    Priority: High  . Essential hypertension 06/17/2015    Priority: High  . CVA (cerebral vascular accident) ([place]) 04/30/2015    Priority: High  . Hyperlipidemia 06/10/2009    Priority: High  . Lung cancer, lingula ([place]) 09/08/2016    Priority: Medium  . 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 Malignant neoplasm of upper-outer quadrant of left breast in female, estrogen receptor positive ([place]) 06/08/2013    Priority: Medium  . Multinodular goiter 01/11/2013    Priority: Medium  . 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 ESOPHAGEAL STRICTURE 09/26/2009    Priority: Medium  . GERD 09/26/2009    Priority: Medium  . Cough variant asthma 06/27/2007     Priority: Medium  . Chronic cough 01/21/2007    Priority: Medium  . LUNG CANCER, HX OF 07/29/2006    Priority: Medium  . Arthritis 01/27/2017    Priority: Low  . Vitamin D deficiency 01/06/2017    Priority: Low  . Postmenopausal bone loss 01/06/2017    Priority: Low  . Osteopenia 05/28/2007    Priority: Low  . h/o Radiation 04/14/2017  . High  risk medication use 04/14/2017  . History of CVA (cerebrovascular accident) 05/14/2016  . Abnormal PFT 10/24/2015  . Aortic regurgitation 06/17/2015  . CVA (cerebral infarction)   . Pulmonary edema 04/27/2015  . General medical examination 06/09/2010  . Screening for malignant neoplasm of the cervix 06/09/2010  . HEARING LOSS, UNSPEC. 08/15/2009  . Diverticulosis of large intestine 06/10/2009  . GLAUCOMA, BILATERAL 02/07/2009  . STRICTURE AND STENOSIS OF CERVIX 05/06/2007  . POSTMENOPAUSAL STATUS 05/06/2007    Past Medical history, Surgical history, Family history, Social history, Allergies and Medications have been entered into the medical record, reviewed and changed as needed.    Current Meds  Medication Sig  . albuterol (PROVENTIL HFA;VENTOLIN HFA) 108 (90 Base) MCG/ACT inhaler Inhale 2 puffs into the lungs every 6 (six) hours as needed for wheezing or shortness of breath.  Marland Kitchen atorvastatin (LIPITOR) 40 MG tablet TAKE 1 TABLET DAILY  . digoxin (LANOXIN) 0.125 MG tablet TAKE 1 TABLET DAILY  . diltiazem (CARDIZEM CD) 180 MG 24 hr capsule TAKE 1 CAPSULE DAILY  . ELIQUIS 5 MG TABS tablet TAKE 1 TABLET TWICE A DAY  . fluticasone-salmeterol (ADVAIR HFA) 230-21 MCG/ACT inhaler USE 2 INHALATIONS TWICE A DAY  . furosemide (LASIX) 20 MG tablet TAKE 2 TABLETS EVERY MORNING AND 1 TABLET EVERY EVENING  . gabapentin (NEURONTIN) 400 MG capsule TAKE 1 CAPSULE TWICE A DAY  . loratadine (CLARITIN) 10 MG tablet Take 10 mg by mouth daily as needed for allergies. Reported on 09/16/2015  . Multiple Vitamins-Iron (MULTIVITAMIN/IRON) TABS Take 1 tablet by  mouth daily.   Marland Kitchen omeprazole (PRILOSEC) 20 MG capsule TAKE 1 CAPSULE 30 MINUTES BEFORE A MEAL, BID ( before lunch and dinner)  . thyroid (ARMOUR THYROID) 60 MG tablet Take 1 tablet (60 mg total) by mouth daily before breakfast.  . Vitamin D, Ergocalciferol, (DRISDOL) 50000 units CAPS capsule Take 1 capsule (50,000 Units total) every 7 (seven) days by mouth.  . [DISCONTINUED] anastrozole (ARIMIDEX) 1 MG tablet Take 1 tablet (1 mg total) by mouth daily.  . [DISCONTINUED] omeprazole (PRILOSEC) 20 MG capsule TAKE 1 CAPSULE 30 MINUTES BEFORE A MEAL, BID ( before lunch and dinner)  . [DISCONTINUED] potassium chloride SA (K-DUR,KLOR-CON) 20 MEQ tablet Take 1 tablet (20 mEq total) by mouth 2 (two) times daily.  . [DISCONTINUED] thyroid (ARMOUR THYROID) 60 MG tablet Take 1 tablet (60 mg total) by mouth daily before breakfast.  . [DISCONTINUED] traMADol (ULTRAM) 50 MG tablet Take 1 tablet (50 mg total) by mouth every 6 (six) hours as needed. OV prior to RF    Allergies:  Allergies  Allergen Reactions  . Combigan [Brimonidine Tartrate-Timolol] Itching    Eyes itch, reddened  . Other Other (See Comments)    Per patient made OU red, Sore, and sensitivity to light  . Sulfa Antibiotics Rash  . Sulfamethoxazole-Trimethoprim Hives     Review of Systems:  A fourteen system review of systems was performed and found to be positive as per HPI.   Objective:   Blood pressure 134/90, pulse 89, height 4\' 11"  (1.499 m), weight 133 lb (60.3 kg), SpO2 99 %. Body mass index is 26.86 kg/m. General:  Well Developed, well nourished, appropriate for stated age.  Neuro:  Alert and oriented,  extra-ocular muscles intact  HEENT:  Normocephalic, atraumatic, neck supple, no carotid bruits appreciated  Skin:  no gross rash, warm, pink. Cardiac:  Irregularly irregular, S1 S2 Respiratory:  ECTA B/L and A/P, Not using accessory muscles, speaking in full  sentences- unlabored. Vascular:  Ext warm, no cyanosis apprec.; cap  RF less 2 sec. Psych:  No HI/SI, judgement and insight good, Euthymic mood. Full Affect.

## 2017-04-14 NOTE — Patient Instructions (Addendum)
Omeprazole Usage Try to only take omeprazole once daily before your nighttime meal.   If you are not eating any of the trigger foods for reflux, try to stick the lunchtime omeprazole. If you start getting the cough or bad taste in your mouth, resume taking it twice a year. Look for the chronic cough and funny taste in your mouth as evidence of the reflux.    Gastroesophageal Reflux Disease, Adult Normally, food travels down the esophagus and stays in the stomach to be digested. However, when a person has gastroesophageal reflux disease (GERD), food and stomach acid move back up into the esophagus. When this happens, the esophagus becomes sore and inflamed. Over time, GERD can create small holes (ulcers) in the lining of the esophagus. What are the causes? This condition is caused by a problem with the muscle between the esophagus and the stomach (lower esophageal sphincter, or LES). Normally, the LES muscle closes after food passes through the esophagus to the stomach. When the LES is weakened or abnormal, it does not close properly, and that allows food and stomach acid to go back up into the esophagus. The LES can be weakened by certain dietary substances, medicines, and medical conditions, including:  Tobacco use.  Pregnancy.  Having a hiatal hernia.  Heavy alcohol use.  Certain foods and beverages, such as coffee, chocolate, onions, and peppermint.  What increases the risk? This condition is more likely to develop in:  People who have an increased body weight.  People who have connective tissue disorders.  People who use NSAID medicines.  What are the signs or symptoms? Symptoms of this condition include:  Heartburn.  Difficult or painful swallowing.  The feeling of having a lump in the throat.  Abitter taste in the mouth.  Bad breath.  Having a large amount of saliva.  Having an upset or bloated stomach.  Belching.  Chest pain.  Shortness of breath or  wheezing.  Ongoing (chronic) cough or a night-time cough.  Wearing away of tooth enamel.  Weight loss.  Different conditions can cause chest pain. Make sure to see your health care provider if you experience chest pain. How is this diagnosed? Your health care provider will take a medical history and perform a physical exam. To determine if you have mild or severe GERD, your health care provider may also monitor how you respond to treatment. You may also have other tests, including:  An endoscopy toexamine your stomach and esophagus with a small camera.  A test thatmeasures the acidity level in your esophagus.  A test thatmeasures how much pressure is on your esophagus.  A barium swallow or modified barium swallow to show the shape, size, and functioning of your esophagus.  How is this treated? The goal of treatment is to help relieve your symptoms and to prevent complications. Treatment for this condition may vary depending on how severe your symptoms are. Your health care provider may recommend:  Changes to your diet.  Medicine.  Surgery.  Follow these instructions at home: Diet  Follow a diet as recommended by your health care provider. This may involve avoiding foods and drinks such as: ? Coffee and tea (with or without caffeine). ? Drinks that containalcohol. ? Energy drinks and sports drinks. ? Carbonated drinks or sodas. ? Chocolate and cocoa. ? Peppermint and mint flavorings. ? Garlic and onions. ? Horseradish. ? Spicy and acidic foods, including peppers, chili powder, curry powder, vinegar, hot sauces, and barbecue sauce. ? Citrus fruit juices  and citrus fruits, such as oranges, lemons, and limes. ? Tomato-based foods, such as red sauce, chili, salsa, and pizza with red sauce. ? Fried and fatty foods, such as donuts, french fries, potato chips, and high-fat dressings. ? High-fat meats, such as hot dogs and fatty cuts of red and white meats, such as rib eye  steak, sausage, ham, and bacon. ? High-fat dairy items, such as whole milk, butter, and cream cheese.  Eat small, frequent meals instead of large meals.  Avoid drinking large amounts of liquid with your meals.  Avoid eating meals during the 2-3 hours before bedtime.  Avoid lying down right after you eat.  Do not exercise right after you eat. General instructions  Pay attention to any changes in your symptoms.  Take over-the-counter and prescription medicines only as told by your health care provider. Do not take aspirin, ibuprofen, or other NSAIDs unless your health care provider told you to do so.  Do not use any tobacco products, including cigarettes, chewing tobacco, and e-cigarettes. If you need help quitting, ask your health care provider.  Wear loose-fitting clothing. Do not wear anything tight around your waist that causes pressure on your abdomen.  Raise (elevate) the head of your bed 6 inches (15cm).  Try to reduce your stress, such as with yoga or meditation. If you need help reducing stress, ask your health care provider.  If you are overweight, reduce your weight to an amount that is healthy for you. Ask your health care provider for guidance about a safe weight loss goal.  Keep all follow-up visits as told by your health care provider. This is important. Contact a health care provider if:  You have new symptoms.  You have unexplained weight loss.  You have difficulty swallowing, or it hurts to swallow.  You have wheezing or a persistent cough.  Your symptoms do not improve with treatment.  You have a hoarse voice. Get help right away if:  You have pain in your arms, neck, jaw, teeth, or back.  You feel sweaty, dizzy, or light-headed.  You have chest pain or shortness of breath.  You vomit and your vomit looks like blood or coffee grounds.  You faint.  Your stool is bloody or black.  You cannot swallow, drink, or eat. This information is not  intended to replace advice given to you by your health care provider. Make sure you discuss any questions you have with your health care provider. Document Released: 11/26/2004 Document Revised: 07/17/2015 Document Reviewed: 06/13/2014 Elsevier Interactive Patient Education  2018 [place] for Gastroesophageal Reflux Disease, Adult When you have gastroesophageal reflux disease (GERD), the foods you eat and your eating habits are very important. Choosing the right foods can help ease the discomfort of GERD. Consider working with a diet and nutrition specialist (dietitian) to help you make healthy food choices. What general guidelines should I follow? Eating plan  Choose healthy foods low in fat, such as fruits, vegetables, whole grains, low-fat dairy products, and lean meat, fish, and poultry.  Eat frequent, small meals instead of three large meals each day. Eat your meals slowly, in a relaxed setting. Avoid bending over or lying down until 2-3 hours after eating.  Limit high-fat foods such as fatty meats or fried foods.  Limit your intake of oils, butter, and shortening to less than 8 teaspoons each day.  Avoid the following: ? Foods that cause symptoms. These may be different for different people.  Keep a food diary to keep track of foods that cause symptoms. ? Alcohol. ? Drinking large amounts of liquid with meals. ? Eating meals during the 2-3 hours before bed.  Cook foods using methods other than frying. This may include baking, grilling, or broiling. Lifestyle   Maintain a healthy weight. Ask your health care provider what weight is healthy for you. If you need to lose weight, work with your health care provider to do so safely.  Exercise for at least 30 minutes on 5 or more days each week, or as told by your health care provider.  Avoid wearing clothes that fit tightly around your waist and chest.  Do not use any products that contain nicotine or  tobacco, such as cigarettes and e-cigarettes. If you need help quitting, ask your health care provider.  Sleep with the head of your bed raised. Use a wedge under the mattress or blocks under the bed frame to raise the head of the bed. What foods are not recommended? The items listed may not be a complete list. Talk with your dietitian about what dietary choices are best for you. Grains Pastries or quick breads with added fat. [place] toast. Vegetables Deep fried vegetables. [place] fries. Any vegetables prepared with added fat. Any vegetables that cause symptoms. For some people this may include tomatoes and tomato products, chili peppers, onions and garlic, and horseradish. Fruits Any fruits prepared with added fat. Any fruits that cause symptoms. For some people this may include citrus fruits, such as oranges, grapefruit, pineapple, and lemons. Meats and other protein foods High-fat meats, such as fatty beef or pork, hot dogs, ribs, ham, sausage, salami and bacon. Fried meat or protein, including fried fish and fried chicken. Nuts and nut butters. Dairy Whole milk and chocolate milk. Sour cream. Cream. Ice cream. Cream cheese. Milk shakes. Beverages Coffee and tea, with or without caffeine. Carbonated beverages. Sodas. Energy drinks. Fruit juice made with acidic fruits (such as orange or grapefruit). Tomato juice. Alcoholic drinks. Fats and oils Butter. Margarine. Shortening. Ghee. Sweets and desserts Chocolate and cocoa. Donuts. Seasoning and other foods Pepper. Peppermint and spearmint. Any condiments, herbs, or seasonings that cause symptoms. For some people, this may include curry, hot sauce, or vinegar-based salad dressings. Summary  When you have gastroesophageal reflux disease (GERD), food and lifestyle choices are very important to help ease the discomfort of GERD.  Eat frequent, small meals instead of three large meals each day. Eat your meals slowly, in a relaxed setting. Avoid  bending over or lying down until 2-3 hours after eating.  Limit high-fat foods such as fatty meat or fried foods. This information is not intended to replace advice given to you by your health care provider. Make sure you discuss any questions you have with your health care provider. Document Released: 02/16/2005 Document Revised: 02/18/2016 Document Reviewed: 02/18/2016 Elsevier Interactive Patient Education  [employer].

## 2017-04-29 ENCOUNTER — Other Ambulatory Visit: Payer: Self-pay | Admitting: Cardiology

## 2017-05-13 ENCOUNTER — Other Ambulatory Visit: Payer: Self-pay

## 2017-05-13 ENCOUNTER — Other Ambulatory Visit: Payer: Medicare Other

## 2017-05-13 DIAGNOSIS — N183 Chronic kidney disease, stage 3 unspecified: Secondary | ICD-10-CM

## 2017-05-13 DIAGNOSIS — E559 Vitamin D deficiency, unspecified: Secondary | ICD-10-CM

## 2017-05-13 DIAGNOSIS — R7303 Prediabetes: Secondary | ICD-10-CM | POA: Diagnosis not present

## 2017-05-14 LAB — COMPREHENSIVE METABOLIC PANEL
A/G RATIO: 1.5 (ref 1.2–2.2)
ALK PHOS: 96 IU/L (ref 39–117)
ALT: 25 IU/L (ref 0–32)
AST: 17 IU/L (ref 0–40)
Albumin: 3.8 g/dL (ref 3.5–4.8)
BILIRUBIN TOTAL: 0.6 mg/dL (ref 0.0–1.2)
BUN/Creatinine Ratio: 14 (ref 12–28)
BUN: 14 mg/dL (ref 8–27)
CO2: 25 mmol/L (ref 20–29)
Calcium: 8.4 mg/dL — ABNORMAL LOW (ref 8.7–10.3)
Chloride: 101 mmol/L (ref 96–106)
Creatinine, Ser: 1.02 mg/dL — ABNORMAL HIGH (ref 0.57–1.00)
GFR calc Af Amer: 62 mL/min/{1.73_m2} (ref >59)
GFR calc non Af Amer: 54 mL/min/{1.73_m2} — ABNORMAL LOW (ref >59)
GLOBULIN, TOTAL: 2.5 g/dL (ref 1.5–4.5)
Glucose: 116 mg/dL — ABNORMAL HIGH (ref 65–99)
POTASSIUM: 4.4 mmol/L (ref 3.5–5.2)
SODIUM: 140 mmol/L (ref 134–144)
Total Protein: 6.3 g/dL (ref 6.0–8.5)

## 2017-05-14 LAB — VITAMIN D 25 HYDROXY (VIT D DEFICIENCY, FRACTURES): Vit D, 25-Hydroxy: 21.1 ng/mL — ABNORMAL LOW (ref 30.0–100.0)

## 2017-05-14 LAB — HEMOGLOBIN A1C
ESTIMATED AVERAGE GLUCOSE: 128 mg/dL
HEMOGLOBIN A1C: 6.1 % — AB (ref 4.8–5.6)

## 2017-05-20 ENCOUNTER — Ambulatory Visit: Payer: Medicare Other | Admitting: Family Medicine

## 2017-05-22 ENCOUNTER — Other Ambulatory Visit: Payer: Self-pay | Admitting: Physician Assistant

## 2017-05-23 ENCOUNTER — Encounter: Payer: Self-pay | Admitting: Gastroenterology

## 2017-05-27 ENCOUNTER — Ambulatory Visit (INDEPENDENT_AMBULATORY_CARE_PROVIDER_SITE_OTHER): Payer: Medicare Other | Admitting: Family Medicine

## 2017-05-27 ENCOUNTER — Telehealth: Payer: Self-pay

## 2017-05-27 ENCOUNTER — Encounter: Payer: Self-pay | Admitting: Family Medicine

## 2017-05-27 VITALS — BP 138/82 | HR 76 | Ht 59.0 in | Wt 131.4 lb

## 2017-05-27 DIAGNOSIS — N183 Chronic kidney disease, stage 3 unspecified: Secondary | ICD-10-CM

## 2017-05-27 DIAGNOSIS — E559 Vitamin D deficiency, unspecified: Secondary | ICD-10-CM

## 2017-05-27 DIAGNOSIS — R7303 Prediabetes: Secondary | ICD-10-CM | POA: Diagnosis not present

## 2017-05-27 DIAGNOSIS — M8580 Other specified disorders of bone density and structure, unspecified site: Secondary | ICD-10-CM | POA: Diagnosis not present

## 2017-05-27 DIAGNOSIS — I481 Persistent atrial fibrillation: Secondary | ICD-10-CM | POA: Diagnosis not present

## 2017-05-27 DIAGNOSIS — I5032 Chronic diastolic (congestive) heart failure: Secondary | ICD-10-CM | POA: Diagnosis not present

## 2017-05-27 DIAGNOSIS — I10 Essential (primary) hypertension: Secondary | ICD-10-CM

## 2017-05-27 DIAGNOSIS — I6389 Other cerebral infarction: Secondary | ICD-10-CM

## 2017-05-27 DIAGNOSIS — E785 Hyperlipidemia, unspecified: Secondary | ICD-10-CM | POA: Diagnosis not present

## 2017-05-27 DIAGNOSIS — I4819 Other persistent atrial fibrillation: Secondary | ICD-10-CM

## 2017-05-27 MED ORDER — VITAMIN D (ERGOCALCIFEROL) 1.25 MG (50000 UNIT) PO CAPS: ORAL_CAPSULE | ORAL | 3 refills | Status: DC

## 2017-05-27 NOTE — Patient Instructions (Addendum)
Please do not do any heavy exertion and putting herself through any more cardiac stress test.  Please do not lift anything more than 10 pounds.  If you have any funny rhythm change or funny sensation of fluttering in your heart during exertion, please stop exerting yourself until symptoms resolve.  If they do not, dial 911.  Please follow-up with cardiology in the very near future as we have scheduled for you.  Since you are not experience any chest pain, shortness of breath, dizziness etc., if you do develop any along with exertion, please dial 911   -Switch once weekly vitamin D to twice weekly.  Take 1 tablet on Wednesday and 1 tablet on Sunday.      Continue taking your multivitamin daily as long as it has 1200 mg of elemental calcium in it.   Remember it is important for your chronic kidney disease to make sure that your blood sugars and blood pressures are well controlled.  Continue to make sure you are drinking at least half of your weight in ounces of water per day.  Try to follow the prediabetes diet below and choose low glycemic index foods.  We will recheck your A1c in 4 months.  As well we will recheck your BMP with GFR  .Risk factors for prediabetes and type 2 diabetes  Researchers don't fully understand why some people develop prediabetes and type 2 diabetes and others don't.  It's clear that certain factors increase the risk, however, including:  Weight. The more fatty tissue you have, the more resistant your cells become to insulin.  Inactivity. The less active you are, the greater your risk. Physical activity helps you control your weight, uses up glucose as energy and makes your cells more sensitive to insulin.  Family history. Your risk increases if a parent or sibling has type 2 diabetes.  Race. Although it's unclear why, people of certain races -- including blacks, Hispanics, American Indians and Asian-Americans -- are at higher risk.  Age. Your risk increases as you get older.  This may be because you tend to exercise less, lose muscle mass and gain weight as you age. But type 2 diabetes is also increasing dramatically among children, adolescents and younger adults.  Gestational diabetes. If you developed gestational diabetes when you were pregnant, your risk of developing prediabetes and type 2 diabetes later increases. If you gave birth to a baby weighing more than 9 pounds (4 kilograms), you're also at risk of type 2 diabetes.  Polycystic ovary syndrome. For women, having polycystic ovary syndrome -- a common condition characterized by irregular menstrual periods, excess hair growth and obesity -- increases the risk of diabetes.  High blood pressure. Having blood pressure over 140/90 millimeters of mercury (mm Hg) is linked to an increased risk of type 2 diabetes.  Abnormal cholesterol and triglyceride levels. If you have low levels of high-density lipoprotein (HDL), or "good," cholesterol, your risk of type 2 diabetes is higher. Triglycerides are another type of fat carried in the blood. People with high levels of triglycerides have an increased risk of type 2 diabetes. Your doctor can let you know what your cholesterol and triglyceride levels are.  A good guide to good carbs: The glycemic index ---If you have diabetes, or at risk for diabetes, you know all too well that when you eat carbohydrates, your blood sugar goes up. The total amount of carbs you consume at a meal or in a snack mostly determines what your blood sugar will  do. But the food itself also plays a role. A serving of white rice has almost the same effect as eating pure table sugar -- a quick, high spike in blood sugar. A serving of lentils has a slower, smaller effect.  ---Picking good sources of carbs can help you control your blood sugar and your weight. Even if you don't have diabetes, eating healthier carbohydrate-rich foods can help ward off a host of chronic conditions, from heart disease to various  cancers to, well, diabetes.  ---One way to choose foods is with the glycemic index (GI). This tool measures how much a food boosts blood sugar.  The glycemic index rates the effect of a specific amount of a food on blood sugar compared with the same amount of pure glucose. A food with a glycemic index of 28 boosts blood sugar only 28% as much as pure glucose. One with a GI of 95 acts like pure glucose.    High glycemic foods result in a quick spike in insulin and blood sugar (also known as blood glucose).  Low glycemic foods have a slower, smaller effect- these are healthier for you.   Using the glycemic index Using the glycemic index is easy: choose foods in the low GI category instead of those in the high GI category (see below), and go easy on those in between. Low glycemic index (GI of 55 or less): Most fruits and vegetables, beans, minimally processed grains, pasta, low-fat dairy foods, and nuts.  Moderate glycemic index (GI 56 to 69): White and sweet potatoes, corn, white rice, couscous, breakfast cereals such as Cream of Wheat and Mini Wheats.  High glycemic index (GI of 70 or higher): White bread, rice cakes, most crackers, bagels, cakes, doughnuts, croissants, most packaged breakfast cereals. You can see the values for 100 commons foods and get links to more at www.health.CheapToothpicks.si.  Swaps for lowering glycemic index  Instead of this high-glycemic index food Eat this lower-glycemic index food  White rice Brown rice or converted rice  Instant oatmeal Steel-cut oats  Cornflakes Bran flakes  Baked potato Pasta, bulgur  White bread Whole-grain bread  Corn Peas or leafy greens       Prediabetes Eating Plan  Prediabetes--also called impaired glucose tolerance or impaired fasting glucose--is a condition that causes blood sugar (blood glucose) levels to be higher than normal. Following a healthy diet can help to keep prediabetes under control. It can also help to lower the  risk of type 2 diabetes and heart disease, which are increased in people who have prediabetes. Along with regular exercise, a healthy diet:  Promotes weight loss.  Helps to control blood sugar levels.  Helps to improve the way that the body uses insulin.   WHAT DO I NEED TO KNOW ABOUT THIS EATING PLAN?   Use the glycemic index (GI) to plan your meals. The index tells you how quickly a food will raise your blood sugar. Choose low-GI foods. These foods take a longer time to raise blood sugar.  Pay close attention to the amount of carbohydrates in the food that you eat. Carbohydrates increase blood sugar levels.  Keep track of how many calories you take in. Eating the right amount of calories will help you to achieve a healthy weight. Losing about 7 percent of your starting weight can help to prevent type 2 diabetes.  You may want to follow a Mediterranean diet. This diet includes a lot of vegetables, lean meats or fish, whole grains, fruits, and healthy  oils and fats.   WHAT FOODS CAN I EAT?  Grains Whole grains, such as whole-wheat or whole-grain breads, crackers, cereals, and pasta. Unsweetened oatmeal. Bulgur. Barley. Quinoa. Brown rice. Corn or whole-wheat flour tortillas or taco shells. Vegetables Lettuce. Spinach. Peas. Beets. Cauliflower. Cabbage. Broccoli. Carrots. Tomatoes. Squash. Eggplant. Herbs. Peppers. Onions. Cucumbers. Brussels sprouts. Fruits Berries. Bananas. Apples. Oranges. Grapes. Papaya. Mango. Pomegranate. Kiwi. Grapefruit. Cherries. Meats and Other Protein Sources Seafood. Lean meats, such as chicken and [place] or lean cuts of pork and beef. Tofu. Eggs. Nuts. Beans. Dairy Low-fat or fat-free dairy products, such as yogurt, cottage cheese, and cheese. Beverages Water. Tea. Coffee. Sugar-free or diet soda. Seltzer water. Milk. Milk alternatives, such as soy or almond milk. Condiments Mustard. Relish. Low-fat, low-sugar ketchup. Low-fat, low-sugar barbecue  sauce. Low-fat or fat-free mayonnaise. Sweets and Desserts Sugar-free or low-fat pudding. Sugar-free or low-fat ice cream and other frozen treats. Fats and Oils Avocado. Walnuts. Olive oil. The items listed above may not be a complete list of recommended foods or beverages. Contact your dietitian for more options.    WHAT FOODS ARE NOT RECOMMENDED?  Grains Refined white flour and flour products, such as bread, pasta, snack foods, and cereals. Beverages Sweetened drinks, such as sweet iced tea and soda. Sweets and Desserts Baked goods, such as cake, cupcakes, pastries, cookies, and cheesecake. The items listed above may not be a complete list of foods and beverages to avoid. Contact your dietitian for more information.   This information is not intended to replace advice given to you by your health care provider. Make sure you discuss any questions you have with your health care provider.   Document Released: 07/03/2014 Document Reviewed: 07/03/2014 Elsevier Interactive Patient Education [employer].

## 2017-05-27 NOTE — Telephone Encounter (Signed)
Call received from Pt PCP office requesting appt for Pt to see Dr. Radford Pax or APP within next week for irregular heartbeats with exertion.  Pt scheduled with Dr. Curt Bears.

## 2017-05-27 NOTE — Progress Notes (Signed)
Impression and Recommendations:    1. Persistent atrial fibrillation ([place])   2. Prediabetes   3. CKD (chronic kidney disease), stage III ([place])   4. Chronic diastolic heart failure ([place])   5. Hyperlipidemia, unspecified hyperlipidemia type   6. Essential hypertension   7. Osteopenia, unspecified location   8. Vitamin D deficiency   9. Hypocalcemia     1. Persistent atrial fibrillation -Pt is stable at this time and is not currently having any symptoms. She describes her symptoms as lasting 1-2 seconds and only with heavy exertion. She denies CP and SOB or any other associated symptoms.  -Follow up with Dr. Fransico Him or like colleague in cardiology, to address Afib and whether or not further evaluation is necessary to r/o ischemia. -Pt was told to do no heavy exertion until seen by cardiology. If symptoms worsen, she will dial 911.  2. Prediabetes -A1c is 6.1 on 05-13-17 , up slightly from prior on 11-05-16 where it was 6.0.  -Continue to monitor. Recheck in 4 months.  -Continue AHA dietary and exercise guidelines. -Reduce sugar intake.   3. CKD stage 3 -Drink adequate amounts of water, equal to half of your weight in oz per day.  -serum creatinine was slightly improved from 2 months prior, as well as GFR improved as well. Will continue to monitor in 4 months.   4. Chronic diastolic heart failure-  -Pt completely asymptomatic at this time.  -Pt is followed closely by Dr. Radford Pax, cardiology.   5. HLD -continue meds. Pt is stable at this time. Repeat liver enzymes were normal.   6. Essential HTN -BP is well-controlled in office today.  -Continue meds per cardiology, whom she follows up with closely.  7. Osteopenia -Continue daily multivitamin. Ensure the MVI includes 1200 mg elemental calcium. -will continue to monitor levels.   8. Vitamin D deficiency -Increase ergocalciferol to twice weekly. -Pt's recent blood work shows Vitamin D at 21.1 ng/mL, which is not  at goal of being between 50-60 ng/mL.   9. Hypocalcemia -Ensure the MVI includes 1200 mg elemental calcium. -will check ionized calcium in near future.  -Recommend daily exercise.     Orders Placed This Encounter  Procedures  . BASIC METABOLIC PANEL WITH GFR  . Hemoglobin A1c  . VITAMIN D 25 Hydroxy (Vit-D Deficiency, Fractures)  . Calcium, ionized  . Magnesium  . Phosphorus    Meds ordered this encounter  Medications  . Vitamin D, Ergocalciferol, (DRISDOL) 50000 units CAPS capsule    Sig: 1 capsule every Wednesday and Sunday    Dispense:  24 capsule    Refill:  3    Gross side effects, risk and benefits, and alternatives of medications and treatment plan in general discussed with patient.  Patient is aware that all medications have potential side effects and we are unable to predict every side effect or drug-drug interaction that may occur.   Patient will call with any questions prior to using medication if they have concerns.  Expresses verbal understanding and consents to current therapy and treatment regimen.  No barriers to understanding were identified.  Red flag symptoms and signs discussed in detail.  Patient expressed understanding regarding what to do in case of emergency\urgent symptoms  Please see AVS handed out to patient at the end of our visit for further patient instructions/ counseling done pertaining to today's office visit.   Return in about 4 months (around 09/26/2017).    Note: This note was prepared  with assistance of Systems analyst. Occasional wrong-word or sound-a-like substitutions may have occurred due to the inherent limitations of voice recognition software.  This document serves as a record of services personally performed by Mellody Dance, DO. It was created on her behalf by Mayer Masker, a trained medical scribe. The creation of this record is based on the scribe's personal observations and the provider's statements to them.    I have reviewed the above medical documentation for accuracy and completeness and I concur.  Mellody Dance 06/08/17 6:31 PM  --------------------------------------------------------------------------------------------------------------------------------------------------------------------------------------------------------------------------------------------    Subjective:     HPI: Patricia [name] is a 77 y.o. female who presents to [place] [facility] today for issues as discussed below.  Heart Pt has a h/o Afib and CVA. She has occasional heart flutter she describes as if she was climbing a hill and felt a small blip in her heart beat. This was exertional at the time when her symptoms began (either playing golf or walking up flights of stairs with 6-8 grocery bags). This has only happened 1-2 times in about 2 months and lasts for 1 about second. This does not happen every time she plays golf or walk up the stairs. She denies CP and SOB. She denies any associated symptoms other than general tiredness. She is on eliquis and is not missing any doses.   She only takes 1 advil per week.   She is taking a multivitamin but not calcium vitamin D. She has not gotten that much sunlight recently since the winter season.   Wt Readings from Last 3 Encounters:  05/31/17 133 lb 9.6 oz (60.6 kg)  05/27/17 131 lb 6.4 oz (59.6 kg)  04/14/17 133 lb (60.3 kg)   BP Readings from Last 3 Encounters:  05/31/17 124/78  05/27/17 138/82  04/14/17 134/90   Pulse Readings from Last 3 Encounters:  05/31/17 73  05/27/17 76  04/14/17 89   BMI Readings from Last 3 Encounters:  05/31/17 26.98 kg/m  05/27/17 26.54 kg/m  04/14/17 26.86 kg/m     Patient Care Team    Relationship Specialty Notifications Start End  Mellody Dance, DO PCP - General Family Medicine  09/08/16   Thea Silversmith, MD (Inactive) Consulting Physician Radiation Oncology  11/29/14   Magrinat, Virgie Dad, MD  Consulting Physician Oncology  11/29/14   Stark Klein, MD Consulting Physician General Surgery  11/29/14   Sylvan Cheese, NP Nurse Practitioner Nurse Practitioner  11/29/14    Comment: Survivorship  Ladene Artist, MD Consulting Physician Gastroenterology  02/14/15   Sueanne Margarita, MD Consulting Physician Cardiology  09/08/16   Marygrace Drought, MD Consulting Physician Ophthalmology  01/06/17    Comment: Sees him for glaucoma     Patient Active Problem List   Diagnosis Date Noted  . Prediabetes 01/06/2017    Priority: High  . CKD (chronic kidney disease), stage III ([employer]) 10/30/2016    Priority: High  . Persistent atrial fibrillation ([facility])     Priority: High  . Chronic diastolic heart failure ([place]) 06/17/2015    Priority: High  . Essential hypertension 06/17/2015    Priority: High  . CVA (cerebral vascular accident) ([place]) 04/30/2015    Priority: High  . Hyperlipidemia 06/10/2009    Priority: High  . Lung cancer, lingula ([place]) 09/08/2016    Priority: Medium  . 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 Malignant neoplasm of upper-outer quadrant of left breast in female,  estrogen receptor positive ([place]) 06/08/2013    Priority: Medium  . Multinodular goiter 01/11/2013    Priority: Medium  . 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 ESOPHAGEAL STRICTURE 09/26/2009    Priority: Medium  . GERD 09/26/2009    Priority: Medium  . Cough variant asthma 06/27/2007    Priority: Medium  . Chronic cough 01/21/2007    Priority: Medium  . LUNG CANCER, HX OF 07/29/2006    Priority: Medium  . Arthritis 01/27/2017    Priority: Low  . Vitamin D deficiency 01/06/2017    Priority: Low  . Postmenopausal bone loss 01/06/2017    Priority: Low  . Osteopenia 05/28/2007    Priority: Low  . Hypocalcemia 05/27/2017  . h/o Radiation 04/14/2017  . High risk medication use 04/14/2017  . History of CVA (cerebrovascular accident) 05/14/2016  . Abnormal  PFT 10/24/2015  . Aortic regurgitation 06/17/2015  . CVA (cerebral infarction)   . Pulmonary edema 04/27/2015  . General medical examination 06/09/2010  . Screening for malignant neoplasm of the cervix 06/09/2010  . HEARING LOSS, UNSPEC. 08/15/2009  . Diverticulosis of large intestine 06/10/2009  . GLAUCOMA, BILATERAL 02/07/2009  . STRICTURE AND STENOSIS OF CERVIX 05/06/2007  . POSTMENOPAUSAL STATUS 05/06/2007    Past Medical history, Surgical history, Family history, Social history, Allergies and Medications have been entered into the medical record, reviewed and changed as needed.    Current Meds  Medication Sig  . albuterol (PROVENTIL HFA;VENTOLIN HFA) 108 (90 Base) MCG/ACT inhaler Inhale 2 puffs into the lungs every 6 (six) hours as needed for wheezing or shortness of breath.  . anastrozole (ARIMIDEX) 1 MG tablet Take 1 tablet (1 mg total) by mouth daily.  Marland Kitchen atorvastatin (LIPITOR) 40 MG tablet TAKE 1 TABLET DAILY  . diltiazem (CARDIZEM CD) 180 MG 24 hr capsule TAKE 1 CAPSULE DAILY  . ELIQUIS 5 MG TABS tablet TAKE 1 TABLET TWICE A DAY  . fluticasone-salmeterol (ADVAIR HFA) 230-21 MCG/ACT inhaler USE 2 INHALATIONS TWICE A DAY  . furosemide (LASIX) 20 MG tablet TAKE 2 TABLETS EVERY MORNING AND 1 TABLET EVERY EVENING  . KLOR-CON M20 20 MEQ tablet TAKE 1 TABLET TWICE A DAY  . loratadine (CLARITIN) 10 MG tablet Take 10 mg by mouth daily as needed for allergies. Reported on 09/16/2015  . Multiple Vitamins-Iron (MULTIVITAMIN/IRON) TABS Take 1 tablet by mouth daily.   Marland Kitchen omeprazole (PRILOSEC) 20 MG capsule TAKE 1 CAPSULE 30 MINUTES BEFORE A MEAL, BID ( before lunch and dinner)  . thyroid (ARMOUR THYROID) 60 MG tablet Take 1 tablet (60 mg total) by mouth daily before breakfast.  . Vitamin D, Ergocalciferol, (DRISDOL) 50000 units CAPS capsule 1 capsule every Wednesday and Sunday  . [DISCONTINUED] digoxin (LANOXIN) 0.125 MG tablet TAKE 1 TABLET DAILY  . [DISCONTINUED] gabapentin (NEURONTIN) 400  MG capsule TAKE 1 CAPSULE TWICE A DAY  . [DISCONTINUED] Vitamin D, Ergocalciferol, (DRISDOL) 50000 units CAPS capsule Take 1 capsule (50,000 Units total) every 7 (seven) days by mouth.    Allergies:  Allergies  Allergen Reactions  . Combigan [Brimonidine Tartrate-Timolol] Itching    Eyes itch, reddened  . Other Other (See Comments)    Per patient made OU red, Sore, and sensitivity to light  . Sulfa Antibiotics Rash  . Sulfamethoxazole-Trimethoprim Hives     Review of Systems:  A fourteen system review of systems was performed and found to be positive as per HPI.   Objective:   Blood pressure 138/82, pulse 76, height 4'  11" (1.499 m), weight 131 lb 6.4 oz (59.6 kg), SpO2 98 %. Body mass index is 26.54 kg/m. General:  Well Developed, well nourished, appropriate for stated age.  Neuro:  Alert and oriented,  extra-ocular muscles intact  HEENT:  Normocephalic, atraumatic, neck supple, no carotid bruits appreciated  Skin:  no gross rash, warm, pink. Cardiac:  S1 S2 irregularly irregular. Respiratory:  ECTA B/L and A/P, Not using accessory muscles, speaking in full sentences- unlabored. Vascular:  Ext warm, no cyanosis apprec.; cap RF less 2 sec. Psych:  No HI/SI, judgement and insight good, Euthymic mood. Full Affect.

## 2017-05-31 ENCOUNTER — Encounter: Payer: Self-pay | Admitting: Cardiology

## 2017-05-31 ENCOUNTER — Other Ambulatory Visit: Payer: Self-pay

## 2017-05-31 ENCOUNTER — Ambulatory Visit (INDEPENDENT_AMBULATORY_CARE_PROVIDER_SITE_OTHER): Payer: Medicare Other | Admitting: Cardiology

## 2017-05-31 VITALS — BP 124/78 | HR 73 | Ht 59.0 in | Wt 133.6 lb

## 2017-05-31 DIAGNOSIS — I481 Persistent atrial fibrillation: Secondary | ICD-10-CM | POA: Diagnosis not present

## 2017-05-31 DIAGNOSIS — I10 Essential (primary) hypertension: Secondary | ICD-10-CM | POA: Diagnosis not present

## 2017-05-31 DIAGNOSIS — I4819 Other persistent atrial fibrillation: Secondary | ICD-10-CM

## 2017-05-31 DIAGNOSIS — I5032 Chronic diastolic (congestive) heart failure: Secondary | ICD-10-CM

## 2017-05-31 NOTE — Patient Instructions (Signed)
Medication Instructions:  Your physician recommends that you continue on your current medications as directed. Please refer to the Current Medication list given to you today.  Labwork: None ordered  Testing/Procedures: None ordered  Follow-Up: No follow up is needed at this time with Dr. Curt Bears.  He will see you on an as needed basis.  * If you need a refill on your cardiac medications before your next appointment, please call your pharmacy.   *Please note that any paperwork needing to be filled out by the provider will need to be addressed at the front desk prior to seeing the provider. Please note that any FMLA, disability or other documents regarding health condition is subject to a $25.00 charge that must be received prior to completion of paperwork in the form of a money order or check.  Thank you for choosing [facility]!!   Trinidad Curet, RN [phone]

## 2017-05-31 NOTE — Addendum Note (Signed)
Addended by: Stanton Kidney on: 05/31/2017 11:28 AM   Modules accepted: Orders

## 2017-05-31 NOTE — Progress Notes (Signed)
Electrophysiology Office Note   Date:  05/31/2017   ID:  Patricia [name], DOB 08/01/1940, MRN [id number]  PCP:  Mellody Dance, DO  Cardiologist:  Radford Pax Primary Electrophysiologist:  Constance Haw, MD    Chief Complaint  Patient presents with  . Atrial Fibrillation     History of Present Illness: Patricia [name] is a 77 y.o. female who is being seen today for the evaluation of atrial fibrillation at the request of Mellody Dance. Presenting today for electrophysiology evaluation.  She has a history of lung cancer status post resection in 2003, breast cancer status post lumpectomy chemo, and radiation, hypothyroidism, hypertension, hyperlipidemia.  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  She had cardioversion to sinus rhythm in 2017.  She was previously put on amiodarone 200 mg twice a day which was stopped due to prolonged QTC.  She overall is feeling well today.  She has noted that at times when she exerts herself, she has palpitations.  The palpitations last a second or 2 and feel like a flip flopping of her heart.  This only occurs when she exerts herself.  She has not had any further episodes of rapid heart rates with her atrial fibrillation.  Today, she denies symptoms of chest pain, shortness of breath, orthopnea, PND, lower extremity edema, claudication, dizziness, presyncope, syncope, bleeding, or neurologic sequela. The patient is tolerating medications without difficulties.    Past Medical History:  Diagnosis Date  . Allergic rhinitis, cause unspecified   . Aortic regurgitation 06/17/2015   Mild to moderate AR by echo 04/2015  . 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 Chronic cough   . Chronic diastolic heart failure ([place]) 06/17/2015  . CKD (chronic kidney disease), stage III ([place])   . Cough variant asthma     . Disease of pharynx or nasopharynx   . Diverticulosis   . Essential hypertension   . GERD (gastroesophageal reflux disease)   . Glaucoma   . Hiatal hernia   . Hyperlipidemia   . Internal hemorrhoids   . Laryngospasm   . Multinodular goiter   . Osteopenia   . Persistent atrial fibrillation ([place])    a. Dx 04/2015 at time of stroke. b. DCCV 06/2015 - did not hold. Amio started then stopped due to QT prolongation.  Marland Kitchen Postmenopausal   . Radiation 10/22/14-11/23/14   Left Breast  . Stricture and stenosis of cervix    Past Surgical History:  Procedure Laterality Date  . BREAST SURGERY    . CARDIOVERSION N/A 06/24/2015   Procedure: CARDIOVERSION;  Surgeon: Sueanne Margarita, MD;  Location: [facility];  Service: Cardiovascular;  Laterality: N/A;  . CARDIOVERSION N/A 05/19/2016   Procedure: CARDIOVERSION;  Surgeon: Sueanne Margarita, MD;  Location: [facility];  Service: Cardiovascular;  Laterality: N/A;  . COLONOSCOPY    . LUNG CANCER SURGERY  2003   resection carcinoid lingula-lt upper lobe  . RADIOACTIVE SEED GUIDED PARTIAL MASTECTOMY WITH AXILLARY SENTINEL LYMPH NODE BIOPSY Left 06/26/2014   Procedure: RADIOACTIVE SEED GUIDED PARTIAL MASTECTOMY WITH AXILLARY SENTINEL LYMPH NODE BIOPSY;  Surgeon: Stark Klein, MD;  Location: [place];  Service: General;  Laterality: Left;  . RE-EXCISION OF BREAST LUMPECTOMY Left 08/07/2014   Procedure: RE-EXCISION OF LEFT BREAST LUMPECTOMY;  Surgeon: Stark Klein, MD;  Location:  [place];  Service: General;  Laterality: Left;  . RIGHT HEART CATH N/A 04/27/2016   Procedure: Right Heart Cath;  Surgeon: Larey Dresser, MD;  Location: [facility];  Service: Cardiovascular;  Laterality: N/A;     Current Outpatient Medications  Medication Sig Dispense Refill  . albuterol (PROVENTIL HFA;VENTOLIN HFA) 108 (90 Base) MCG/ACT inhaler Inhale 2 puffs into the lungs every 6 (six) hours as needed for wheezing or shortness of breath.     . anastrozole (ARIMIDEX) 1 MG tablet Take 1 tablet (1 mg total) by mouth daily. 90 tablet 3  . atorvastatin (LIPITOR) 40 MG tablet TAKE 1 TABLET DAILY 90 tablet 3  . digoxin (LANOXIN) 0.125 MG tablet TAKE 1 TABLET DAILY 90 tablet 3  . diltiazem (CARDIZEM CD) 180 MG 24 hr capsule TAKE 1 CAPSULE DAILY 90 capsule 2  . ELIQUIS 5 MG TABS tablet TAKE 1 TABLET TWICE A DAY 180 tablet 3  . fluticasone-salmeterol (ADVAIR HFA) 230-21 MCG/ACT inhaler USE 2 INHALATIONS TWICE A DAY 36 g 1  . furosemide (LASIX) 20 MG tablet TAKE 2 TABLETS EVERY MORNING AND 1 TABLET EVERY EVENING 270 tablet 3  . gabapentin (NEURONTIN) 400 MG capsule TAKE 1 CAPSULE TWICE A DAY 180 capsule 0  . KLOR-CON M20 20 MEQ tablet TAKE 1 TABLET TWICE A DAY 180 tablet 1  . loratadine (CLARITIN) 10 MG tablet Take 10 mg by mouth daily as needed for allergies. Reported on 09/16/2015    . Multiple Vitamins-Iron (MULTIVITAMIN/IRON) TABS Take 1 tablet by mouth daily.     Marland Kitchen omeprazole (PRILOSEC) 20 MG capsule TAKE 1 CAPSULE 30 MINUTES BEFORE A MEAL, BID ( before lunch and dinner) 180 capsule 0  . thyroid (ARMOUR THYROID) 60 MG tablet Take 1 tablet (60 mg total) by mouth daily before breakfast. 90 tablet 1  . Vitamin D, Ergocalciferol, (DRISDOL) 50000 units CAPS capsule 1 capsule every Wednesday and Sunday 24 capsule 3   No current facility-administered medications for this visit.    Facility-Administered Medications Ordered in Other Visits  Medication Dose Route Frequency Provider Last Rate Last Dose  . denosumab (PROLIA) injection 60 mg  60 mg Subcutaneous Once Magrinat, Virgie Dad, MD        Allergies:   Combigan [brimonidine tartrate-timolol]; Other; Sulfa antibiotics; and Sulfamethoxazole-trimethoprim   Social History:  The patient  reports that she quit smoking about 56 years ago. She has a 3.75 pack-year smoking history. She has never used smokeless tobacco. She reports that she drinks alcohol. She reports that she does not use drugs.    Family History:  The patient's family history includes Atrial fibrillation in her son; Hyperlipidemia in her father and mother; Hypertension in her father and mother; Stroke in her father and mother; Transient ischemic attack in her father.    ROS:  Please see the history of present illness.   Otherwise, review of systems is positive for none.   All other systems are reviewed and negative.    PHYSICAL EXAM: VS:  BP 124/78   Pulse 73   Ht 4\' 11"  (1.499 m)   Wt 133 lb 9.6 oz (60.6 kg)   BMI 26.98 kg/m  , BMI Body mass index is 26.98 kg/m. GEN: Well nourished, well developed, in no acute distress  HEENT: normal  Neck: no JVD, carotid bruits, or masses Cardiac: iRRR; no murmurs, rubs, or gallops,no edema  Respiratory:  clear to auscultation bilaterally, normal work of breathing GI: soft, nontender, nondistended, +  BS MS: no deformity or atrophy  Skin: warm and dry Neuro:  Strength and sensation are intact Psych: euthymic mood, full affect  EKG:  EKG is ordered today. Personal review of the ekg ordered shows atrial fibrillation, rate 73, nonspecific T wave abnormalities  Recent Labs: 11/05/2016: Hemoglobin 14.8; TSH 1.540 03/25/2017: Platelet Count 261 05/13/2017: ALT 25; BUN 14; Creatinine, Ser 1.02; Potassium 4.4; Sodium 140    Lipid Panel     Component Value Date/Time   CHOL 136 11/05/2016 0831   TRIG 116 11/05/2016 0831   TRIG 52 01/13/2006 1012   HDL 43 11/05/2016 0831   CHOLHDL 3.2 11/05/2016 0831   CHOLHDL 3 04/01/2016 0952   VLDL 19.2 04/01/2016 0952   LDLCALC 70 11/05/2016 0831   LDLDIRECT 174.3 06/04/2009 0000     Wt Readings from Last 3 Encounters:  05/31/17 133 lb 9.6 oz (60.6 kg)  05/27/17 131 lb 6.4 oz (59.6 kg)  04/14/17 133 lb (60.3 kg)      Other studies Reviewed: Additional studies/ records that were reviewed today include: TTE 04/09/16  Review of the above records today demonstrates:  - Left ventricle: The cavity size was normal. Wall thickness  was   normal. Systolic function was normal. The estimated ejection   fraction was in the range of 55% to 60%. Wall motion was normal;   there were no regional wall motion abnormalities. Features are   consistent with a pseudonormal left ventricular filling pattern,   with concomitant abnormal relaxation and increased filling   pressure (grade 2 diastolic dysfunction). Doppler parameters are   consistent with high ventricular filling pressure. - Aortic valve: There was mild regurgitation. - Mitral valve: Calcified annulus. There was mild regurgitation. - Left atrium: The atrium was severely dilated. - Tricuspid valve: There was moderate regurgitation. - Pulmonary arteries: Systolic pressure was moderately increased.   PA peak pressure: 52 mm Hg (S).   ASSESSMENT AND PLAN:  1.  Persistent atrial fibrillation: Left atrium severely dilated on echo.  Currently on Eliquis.  She is minimally symptomatic thus a rate control strategy has been adopted.  She has been having palpitations when she exerts herself the palpitations last for a second or 2.  It is likely that her palpitations are due to either atrial fibrillation or isolated PVCs.  I did tell her that we could put a cardiac monitor on to see exactly what they are, but at this point, she would like to hold off.  I told her that if she has further symptoms that worsen, we would be happy to see her again.  This patients CHA2DS2-VASc Score and unadjusted Ischemic Stroke Rate (% per year) is equal to 7.2 % stroke rate/year from a score of 5  Above score calculated as 1 point each if present [CHF, HTN, DM, Vascular=MI/PAD/Aortic Plaque, Age if 65-74, or Female] Above score calculated as 2 points each if present [Age > 75, or Stroke/TIA/TE]  2.  Chronic diastolic heart failure: Volume status stable  3.  Hypertension: Well-controlled today   Current medicines are reviewed at length with the patient today.   The patient does not have concerns  regarding her medicines.  The following changes were made today:  none  Labs/ tests ordered today include:  No orders of the defined types were placed in this encounter.    Disposition:   FU with Taniaya Rudder PRN  Signed, Kryslyn Helbig Meredith Leeds, MD  05/31/2017 8:42 AM     [address] [place] [address] [phone] (office) [phone] (fax)

## 2017-06-01 DIAGNOSIS — H401133 Primary open-angle glaucoma, bilateral, severe stage: Secondary | ICD-10-CM | POA: Diagnosis not present

## 2017-06-03 ENCOUNTER — Other Ambulatory Visit: Payer: Self-pay | Admitting: Physician Assistant

## 2017-06-04 ENCOUNTER — Other Ambulatory Visit: Payer: Self-pay | Admitting: Family Medicine

## 2017-06-04 DIAGNOSIS — I5032 Chronic diastolic (congestive) heart failure: Secondary | ICD-10-CM

## 2017-06-04 DIAGNOSIS — I10 Essential (primary) hypertension: Secondary | ICD-10-CM

## 2017-06-04 DIAGNOSIS — I351 Nonrheumatic aortic (valve) insufficiency: Secondary | ICD-10-CM

## 2017-06-04 DIAGNOSIS — I4819 Other persistent atrial fibrillation: Secondary | ICD-10-CM

## 2017-07-13 ENCOUNTER — Other Ambulatory Visit: Payer: Self-pay | Admitting: Family Medicine

## 2017-07-13 DIAGNOSIS — I5032 Chronic diastolic (congestive) heart failure: Secondary | ICD-10-CM

## 2017-07-22 DIAGNOSIS — R922 Inconclusive mammogram: Secondary | ICD-10-CM | POA: Diagnosis not present

## 2017-07-22 DIAGNOSIS — Z853 Personal history of malignant neoplasm of breast: Secondary | ICD-10-CM | POA: Diagnosis not present

## 2017-07-22 DIAGNOSIS — M8589 Other specified disorders of bone density and structure, multiple sites: Secondary | ICD-10-CM | POA: Diagnosis not present

## 2017-08-09 DIAGNOSIS — H401133 Primary open-angle glaucoma, bilateral, severe stage: Secondary | ICD-10-CM | POA: Diagnosis not present

## 2017-08-14 ENCOUNTER — Other Ambulatory Visit: Payer: Self-pay | Admitting: Physician Assistant

## 2017-08-14 DIAGNOSIS — I5033 Acute on chronic diastolic (congestive) heart failure: Secondary | ICD-10-CM

## 2017-08-24 ENCOUNTER — Other Ambulatory Visit: Payer: Self-pay | Admitting: Family Medicine

## 2017-08-24 DIAGNOSIS — I4819 Other persistent atrial fibrillation: Secondary | ICD-10-CM

## 2017-08-24 DIAGNOSIS — I5032 Chronic diastolic (congestive) heart failure: Secondary | ICD-10-CM

## 2017-08-24 DIAGNOSIS — I351 Nonrheumatic aortic (valve) insufficiency: Secondary | ICD-10-CM

## 2017-08-24 DIAGNOSIS — I10 Essential (primary) hypertension: Secondary | ICD-10-CM

## 2017-08-25 ENCOUNTER — Telehealth: Payer: Self-pay | Admitting: Oncology

## 2017-08-25 NOTE — Telephone Encounter (Signed)
Patient called to reschedule  °

## 2017-09-14 DIAGNOSIS — H401133 Primary open-angle glaucoma, bilateral, severe stage: Secondary | ICD-10-CM | POA: Diagnosis not present

## 2017-09-22 ENCOUNTER — Ambulatory Visit: Payer: Medicare Other | Admitting: Oncology

## 2017-09-22 ENCOUNTER — Ambulatory Visit: Payer: Medicare Other

## 2017-09-22 ENCOUNTER — Other Ambulatory Visit: Payer: Medicare Other

## 2017-09-27 ENCOUNTER — Encounter: Payer: Self-pay | Admitting: Family Medicine

## 2017-09-27 ENCOUNTER — Ambulatory Visit (INDEPENDENT_AMBULATORY_CARE_PROVIDER_SITE_OTHER): Payer: Medicare Other | Admitting: Family Medicine

## 2017-09-27 VITALS — BP 135/83 | HR 80 | Ht 59.0 in | Wt 130.5 lb

## 2017-09-27 DIAGNOSIS — E559 Vitamin D deficiency, unspecified: Secondary | ICD-10-CM | POA: Diagnosis not present

## 2017-09-27 DIAGNOSIS — I6389 Other cerebral infarction: Secondary | ICD-10-CM | POA: Diagnosis not present

## 2017-09-27 DIAGNOSIS — K2100 Gastro-esophageal reflux disease with esophagitis, without bleeding: Secondary | ICD-10-CM

## 2017-09-27 DIAGNOSIS — E032 Hypothyroidism due to medicaments and other exogenous substances: Secondary | ICD-10-CM

## 2017-09-27 DIAGNOSIS — I5032 Chronic diastolic (congestive) heart failure: Secondary | ICD-10-CM | POA: Diagnosis not present

## 2017-09-27 DIAGNOSIS — R7303 Prediabetes: Secondary | ICD-10-CM

## 2017-09-27 DIAGNOSIS — R413 Other amnesia: Secondary | ICD-10-CM

## 2017-09-27 DIAGNOSIS — N183 Chronic kidney disease, stage 3 unspecified: Secondary | ICD-10-CM

## 2017-09-27 DIAGNOSIS — I10 Essential (primary) hypertension: Secondary | ICD-10-CM

## 2017-09-27 DIAGNOSIS — E785 Hyperlipidemia, unspecified: Secondary | ICD-10-CM

## 2017-09-27 DIAGNOSIS — J45991 Cough variant asthma: Secondary | ICD-10-CM

## 2017-09-27 DIAGNOSIS — M810 Age-related osteoporosis without current pathological fracture: Secondary | ICD-10-CM | POA: Diagnosis not present

## 2017-09-27 DIAGNOSIS — I481 Persistent atrial fibrillation: Secondary | ICD-10-CM | POA: Diagnosis not present

## 2017-09-27 DIAGNOSIS — I4819 Other persistent atrial fibrillation: Secondary | ICD-10-CM

## 2017-09-27 DIAGNOSIS — I639 Cerebral infarction, unspecified: Secondary | ICD-10-CM | POA: Insufficient documentation

## 2017-09-27 NOTE — Progress Notes (Signed)
Impression and Recommendations:    1. Chronic diastolic heart failure ([place])   2. Cerebrovascular accident (CVA) due to other mechanism ([place])   3. Persistent atrial fibrillation ([place])   4. CKD (chronic kidney disease), stage III ([place])   5. Prediabetes   6. Essential hypertension   7. Cough variant asthma   8. Gastroesophageal reflux disease with esophagitis   9. Hyperlipidemia, unspecified hyperlipidemia type   10. Hypocalcemia   11. Vitamin D deficiency   12. Postmenopausal bone loss   13. Memory changes   14. Hypothyroidism due to medication     Chronic Heart Failure -Asymptomatic, stable.  - Managed by cardiology  CVA  - last seen by Bartolo Darter 10/2016 and discharged from stroke clinic; remains completely asymptomatic and on Eliquis  HTN -Discussed risk for cerebrovascular disease and red flag symptoms due to HTN -Reviewed previous neurology encounters regarding stroke with the patient  -Recommended referral to neurology  Memory -Conducted an in office memory test -Knows person, place and time; accurately drew a clock; remembered three assigned objects within the room; counted down from 100 in increments of three -Counseled patient to continue to monitor memory and notify her if issues arise for referral to neurology -Recommended learning new skills such as language or instruments, brainteasers, and remaining active as ways to improve memory retention  Lifestyle Please realize, EXERCISE IS MEDICINE! -  American Heart Association ( AHA) guidelines for exercise : you should engage in 150 minutes of moderate intensity aerobic activity per week.  - Engaging in regular exercise will improve brain function and memory, as well as improve mood, boost immune system and help with weight management.   - Other beneficial effects of exercise: decreasing blood sugar levels, decreasing blood pressure,  and decreasing bad cholesterol levels/ increasing good cholesterol levels.   -   The AHA strongly endorses consumption of a diet that contains a variety of foods from all the food categories with an emphasis on fruits and vegetables; fat-free and low-fat dairy products; cereal and grain products; legumes and nuts; and fish, poultry, and/or extra lean meats.      GERD -Counseled patient on importance of preventing acid reflux for overall health  -Discussed avoiding acidic food or drinks and avoiding eating or drinking before bed   Cholesterol -Dietary changes such as low saturated & trans fat and low carb/ ketogenic diets discussed with patient.  -Encouraged regular exercise and weight loss when appropriate.   -Educational handouts provided at patient's desire.  -Continue current medication(s).   Also, risks and benefits of medications discussed with patient, including alternative treatments.   Encouraged patient to read drug information handouts to further educate self about the medicine prior to starting it.   -Contact us prior with any Q's/ concerns.  -Instructed patient to schedule follow up in September for lipid panel  Chronic Kidney Disease -CMP last obtained Mar 2019, Serum creatinine stable at 1.02  Continuing Care -Informed patient to come in for fasting blood work in September.  -Pt was in the office today for 32.5+ minutes, with over 50% time spent in face to face counseling of patients various medical conditions, treatment plans of those medical conditions including medicine management and lifestyle modification, strategies to improve health and well being; and in coordination of care. SEE ABOVE TREATMENT PLAN FOR DETAILS    Education and routine counseling performed. Handouts provided.  Orders Placed This Encounter  Procedures  . T4, free  . VITAMIN D 25 Hydroxy (  Vit-D Deficiency, Fractures)  . Magnesium  . Phosphorus  . Comprehensive metabolic panel  . Hemoglobin A1c  . RPR  . Vitamin B12  . Sedimentation rate  . CBC  . TSH  . Folate  .  Lipid panel  . Calcium, ionized  . POCT glycosylated hemoglobin (Hb A1C)    The patient was counseled, risk factors were discussed, anticipatory guidance given.  Gross side effects, risk and benefits, and alternatives of medications discussed with patient.  Patient is aware that all medications have potential side effects and we are unable to predict every side effect or drug-drug interaction that may occur.  Expresses verbal understanding and consents to current therapy plan and treatment regimen.  Return for sept- FBW then OV with  me.  Please see AVS handed out to patient at the end of our visit for further patient instructions/ counseling done pertaining to today's office visit.    Note:  This document was prepared using Dragon voice recognition software and may include unintentional dictation errors.    This document serves as a record of services personally performed by Mellody Dance, MD. It was created on her behalf by Georga Bora, a trained medical scribe. The creation of this record is based on the scribe's personal observations and the provider's statements to them.   I have reviewed the above medical documentation for accuracy and completeness and I concur.  Mellody Dance 10/07/17 1:11 PM    Subjective:    HPI: Patricia [name] is a 77 y.o. female who presents to [place] at [facility] today for follow up for HTN.      HTN:  -  Her blood pressure has been controlled at home.  -Denies dizziness, headaches, slurred speech  - Patient reports good compliance with blood pressure medications  - Denies medication S-E   - Smoking Status noted   - She denies new onset of: chest pain, exercise intolerance, shortness of breath, dizziness, visual changes, headache, lower extremity swelling or claudication.   Breathing -Patient states she has no issues breathing and has not needed her albuterol inhaler  GERD -Patient states she has been compliant with  medication -She does not remember having issues with reflux, but does remember having an esophageal endoscopy  Prediabetes -A1C reduced to 6.0 in Sep 2019 from 6.1 in Mar 2019   Lifestyle -States husband believes her memory has been slightly changed -Patient says she doesn't remember things that she is not concerned with -Stays active playing golf for several hours 4-5 days per week  Cholesterol -Last lipid panel 10/2016 -LDL 70   Last 3 blood pressure readings in our office are as follows: BP Readings from Last 3 Encounters:  09/27/17 135/83  05/31/17 124/78  05/27/17 138/82    Pulse Readings from Last 3 Encounters:  09/27/17 80  05/31/17 73  05/27/17 76    Filed Weights   09/27/17 1511  Weight: 130 lb 8 oz (59.2 kg)      Patient Care Team    Relationship Specialty Notifications Start End  Mellody Dance, DO PCP - General Family Medicine  09/08/16   Thea Silversmith, MD Consulting Physician Radiation Oncology  11/29/14   Magrinat, Virgie Dad, MD Consulting Physician Oncology  11/29/14   Stark Klein, MD Consulting Physician General Surgery  11/29/14   Sylvan Cheese, NP Nurse Practitioner Nurse Practitioner  11/29/14    Comment: Survivorship  Ladene Artist, MD Consulting Physician Gastroenterology  02/14/15   Sueanne Margarita,  MD Consulting Physician Cardiology  09/08/16   Marygrace Drought, MD Consulting Physician Ophthalmology  01/06/17    Comment: Sees him for glaucoma     Lab Results  Component Value Date   CREATININE 1.02 (H) 05/13/2017   BUN 14 05/13/2017   NA 140 05/13/2017   K 4.4 05/13/2017   CL 101 05/13/2017   CO2 25 05/13/2017    Lab Results  Component Value Date   CHOL 136 11/05/2016   CHOL 180 04/01/2016   CHOL 126 09/09/2015    Lab Results  Component Value Date   HDL 43 11/05/2016   HDL 67.20 04/01/2016   HDL 58 09/09/2015    Lab Results  Component Value Date   LDLCALC 70 11/05/2016   LDLCALC 94 04/01/2016   LDLCALC 54  09/09/2015    Lab Results  Component Value Date   TRIG 116 11/05/2016   TRIG 96.0 04/01/2016   TRIG 71 09/09/2015    Lab Results  Component Value Date   CHOLHDL 3.2 11/05/2016   CHOLHDL 3 04/01/2016   CHOLHDL 2.2 09/09/2015    Lab Results  Component Value Date   LDLDIRECT 174.3 06/04/2009   LDLDIRECT 135.5 06/04/2008   LDLDIRECT 161.6 05/20/2007   ===================================================================   Patient Active Problem List   Diagnosis Date Noted  . Prediabetes 01/06/2017    Priority: High  . CKD (chronic kidney disease), stage III ([place]) 10/30/2016    Priority: High  . Persistent atrial fibrillation ([facility])     Priority: High  . Chronic diastolic heart failure ([place]) 06/17/2015    Priority: High  . Essential hypertension 06/17/2015    Priority: High  . CVA (cerebral vascular accident) ([place]) 04/30/2015    Priority: High  . Hyperlipidemia 06/10/2009    Priority: High  . Lung cancer, lingula ([place]) 09/08/2016    Priority: Medium  . 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 Malignant neoplasm of upper-outer quadrant of left breast in female, estrogen receptor positive ([place]) 06/08/2013    Priority: Medium  . Multinodular goiter 01/11/2013    Priority: Medium  . 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 ESOPHAGEAL STRICTURE 09/26/2009    Priority: Medium  . GERD 09/26/2009    Priority: Medium  . Cough variant asthma 06/27/2007    Priority: Medium  . Chronic cough 01/21/2007    Priority: Medium  . LUNG CANCER, HX OF 07/29/2006    Priority: Medium  . Arthritis 01/27/2017    Priority: Low  . Vitamin D deficiency 01/06/2017    Priority: Low  . Postmenopausal bone loss 01/06/2017    Priority: Low  . Osteopenia 05/28/2007    Priority: Low  . Cerebrovascular accident ([place]) 09/27/2017  . Memory changes 09/27/2017  . Hypocalcemia 05/27/2017  . h/o Radiation 04/14/2017  . High risk medication use 04/14/2017  .  History of CVA (cerebrovascular accident) 05/14/2016  . Abnormal PFT 10/24/2015  . Aortic regurgitation 06/17/2015  . CVA (cerebral infarction)   . Pulmonary edema 04/27/2015  . General medical examination 06/09/2010  . Screening for malignant neoplasm of the cervix 06/09/2010  . HEARING LOSS, UNSPEC. 08/15/2009  . Diverticulosis of large intestine 06/10/2009  . GLAUCOMA, BILATERAL 02/07/2009  . STRICTURE AND STENOSIS OF CERVIX 05/06/2007  . POSTMENOPAUSAL STATUS 05/06/2007     Past Medical History:  Diagnosis Date  . Allergic rhinitis, cause unspecified   . Aortic regurgitation 06/17/2015   Mild to moderate AR by echo 04/2015  . Arthritis  in my fingers  . Cancer 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 Chronic cough   . Chronic diastolic heart failure ([place]) 06/17/2015  . CKD (chronic kidney disease), stage III ([place])   . Cough variant asthma   . Disease of pharynx or nasopharynx   . Diverticulosis   . Essential hypertension   . GERD (gastroesophageal reflux disease)   . Glaucoma   . Hiatal hernia   . Hyperlipidemia   . Internal hemorrhoids   . Laryngospasm   . Multinodular goiter   . Osteopenia   . Persistent atrial fibrillation ([place])    a. Dx 04/2015 at time of stroke. b. DCCV 06/2015 - did not hold. Amio started then stopped due to QT prolongation.  Marland Kitchen Postmenopausal   . Radiation 10/22/14-11/23/14   Left Breast  . Stricture and stenosis of cervix      Past Surgical History:  Procedure Laterality Date  . BREAST SURGERY    . CARDIOVERSION N/A 06/24/2015   Procedure: CARDIOVERSION;  Surgeon: Sueanne Margarita, MD;  Location: [facility];  Service: Cardiovascular;  Laterality: N/A;  . CARDIOVERSION N/A 05/19/2016   Procedure: CARDIOVERSION;  Surgeon: Sueanne Margarita, MD;  Location: [facility];  Service: Cardiovascular;  Laterality: N/A;  . COLONOSCOPY    . LUNG CANCER SURGERY  2003   resection carcinoid lingula-lt upper lobe  . RADIOACTIVE SEED GUIDED  PARTIAL MASTECTOMY WITH AXILLARY SENTINEL LYMPH NODE BIOPSY Left 06/26/2014   Procedure: RADIOACTIVE SEED GUIDED PARTIAL MASTECTOMY WITH AXILLARY SENTINEL LYMPH NODE BIOPSY;  Surgeon: Stark Klein, MD;  Location: [place];  Service: General;  Laterality: Left;  . RE-EXCISION OF BREAST LUMPECTOMY Left 08/07/2014   Procedure: RE-EXCISION OF LEFT BREAST LUMPECTOMY;  Surgeon: Stark Klein, MD;  Location: [place];  Service: General;  Laterality: Left;  . RIGHT HEART CATH N/A 04/27/2016   Procedure: Right Heart Cath;  Surgeon: Larey Dresser, MD;  Location: [place] [facility];  Service: Cardiovascular;  Laterality: N/A;     Family History  Problem Relation Age of Onset  . Stroke Mother   . Hypertension Mother   . Hyperlipidemia Mother   . Stroke Father   . Transient ischemic attack Father   . Hyperlipidemia Father   . Hypertension Father   . Atrial fibrillation Son   . Heart attack Neg Hx      Social History   Substance and Sexual Activity  Drug Use No  ,  Social History   Substance and Sexual Activity  Alcohol Use Yes  . Alcohol/week: 0.0 standard drinks   Comment: occasional wine  ,  Social History   Tobacco Use  Smoking Status Former Smoker  . Packs/day: 0.75  . Years: 5.00  . Pack years: 3.75  . Last attempt to quit: 06/14/1961  . Years since quitting: 56.3  Smokeless Tobacco Never Used  ,    Current Outpatient Medications on File Prior to Visit  Medication Sig Dispense Refill  . albuterol (PROVENTIL HFA;VENTOLIN HFA) 108 (90 Base) MCG/ACT inhaler Inhale 2 puffs into the lungs every 6 (six) hours as needed for wheezing or shortness of breath.    . anastrozole (ARIMIDEX) 1 MG tablet Take 1 tablet (1 mg total) by mouth daily. 90 tablet 3  . atorvastatin (LIPITOR) 40 MG tablet TAKE 1 TABLET DAILY 90 tablet 3  . digoxin (LANOXIN) 0.125 MG tablet TAKE 1 TABLET DAILY 90 tablet 3  . diltiazem (CARDIZEM CD) 180  MG 24 hr capsule TAKE 1 CAPSULE  DAILY 90 capsule 2  . ELIQUIS 5 MG TABS tablet TAKE 1 TABLET TWICE A DAY 180 tablet 3  . fluticasone-salmeterol (ADVAIR HFA) 230-21 MCG/ACT inhaler USE 2 INHALATIONS TWICE A DAY 36 g 1  . furosemide (LASIX) 20 MG tablet TAKE 2 TABLETS EVERY MORNING AND 1 TABLET EVERY EVENING 270 tablet 3  . gabapentin (NEURONTIN) 400 MG capsule TAKE 1 CAPSULE TWICE A DAY 180 capsule 0  . KLOR-CON M20 20 MEQ tablet TAKE 1 TABLET TWICE A DAY 180 tablet 1  . loratadine (CLARITIN) 10 MG tablet Take 10 mg by mouth daily as needed for allergies. Reported on 09/16/2015    . Multiple Vitamins-Iron (MULTIVITAMIN/IRON) TABS Take 1 tablet by mouth daily.     Marland Kitchen omeprazole (PRILOSEC) 20 MG capsule TAKE 1 CAPSULE TWICE A DAY 30 MINUTES BEFORE MEALS (BEFORE LUNCH AND DINNER) 180 capsule 0  . thyroid (ARMOUR THYROID) 60 MG tablet Take 1 tablet (60 mg total) by mouth daily before breakfast. 90 tablet 1  . Vitamin D, Ergocalciferol, (DRISDOL) 50000 units CAPS capsule 1 capsule every Wednesday and Sunday 24 capsule 3   Current Facility-Administered Medications on File Prior to Visit  Medication Dose Route Frequency Provider Last Rate Last Dose  . denosumab (PROLIA) injection 60 mg  60 mg Subcutaneous Once Magrinat, Virgie Dad, MD         Allergies  Allergen Reactions  . Combigan [Brimonidine Tartrate-Timolol] Itching    Eyes itch, reddened  . Other Other (See Comments)    Per patient made OU red, Sore, and sensitivity to light  . Sulfa Antibiotics Rash  . Sulfamethoxazole-Trimethoprim Hives     Review of Systems:   General:  Denies fever, chills Optho/Auditory:   Denies visual changes, blurred vision Respiratory:   Denies SOB, cough, wheeze, DIB  Cardiovascular:   Denies chest pain, palpitations, painful respirations Gastrointestinal:   Denies nausea, vomiting, diarrhea.  Endocrine:     Denies new hot or cold intolerance Musculoskeletal:  Denies joint swelling, gait issues, or new unexplained myalgias/  arthralgias Skin:  Denies rash, suspicious lesions  Neurological:    Denies dizziness, unexplained weakness, numbness  Psychiatric/Behavioral:   Denies mood changes  Objective:    Blood pressure 135/83, pulse 80, height 4\' 11"  (1.499 m), weight 130 lb 8 oz (59.2 kg), SpO2 97 %.  Body mass index is 26.36 kg/m.  General: Well Developed, well nourished, and in no acute distress.  HEENT: Normocephalic, atraumatic, pupils equal round reactive to light, neck supple, No carotid bruits, no JVD Skin: Warm and dry, cap RF less 2 sec Cardiac: Regularly irregular, S1, S2 WNL's, no murmurs rubs or gallops Respiratory: ECTA B/L, Not using accessory muscles, speaking in full sentences. NeuroM-Sk: Ambulates w/o assistance, moves ext * 4 w/o difficulty, sensation grossly intact.  Ext: scant edema b/l lower ext Psych: No HI/SI, judgement and insight good, Euthymic mood. Full Affect.

## 2017-09-27 NOTE — Patient Instructions (Addendum)
Around 11/05/2017, Loyda please come in for fasting blood work.  This would be a complete panel to include but not limited to your cholesterol panel, kidney function, calcium levels, thyroid etc.  Then please come in for office visit with me shortly there after to discuss them.  Also please stay on the omeprazole as written as we looked back and Dr. Fuller Plan found erosive esophagitis due to reflux in the past.  Your memory test in the office today all looked great and totally appropriate for someone your age.  However if you have further concerns or questions, please do not hesitate to contact us as we can refer you to neurology for more extensive evaluation if you would like.    Please realize, EXERCISE IS MEDICINE!  -  American Heart Association [facility]) guidelines for exercise : If you are in good health, without any medical conditions, you should engage in 150 minutes of moderate intensity aerobic activity per week.  This means you should be huffing and puffing throughout your workout.   Engaging in regular exercise will improve brain function and memory, as well as improve mood, boost immune system and help with weight management.  As well as the other, more well-known effects of exercise such as decreasing blood sugar levels, decreasing blood pressure,  and decreasing bad cholesterol levels/ increasing good cholesterol levels.     -  The AHA strongly endorses consumption of a diet that contains a variety of foods from all the food categories with an emphasis on fruits and vegetables; fat-free and low-fat dairy products; cereal and grain products; legumes and nuts; and fish, poultry, and/or extra lean meats.    Excessive food intake, especially of foods high in saturated and trans fats, sugar, and salt, should be avoided.    Adequate water intake of roughly 1/2 of your weight in pounds, should equal the ounces of water per day you should drink.  So for instance, if you're 200 pounds, that would be 100  ounces of water per day.         Mediterranean Diet  Why follow it? Research shows. . Those who follow the Mediterranean diet have a reduced risk of heart disease  . The diet is associated with a reduced incidence of Parkinson's and Alzheimer's diseases . People following the diet may have longer life expectancies and lower rates of chronic diseases  . The Dietary Guidelines for Americans recommends the Mediterranean diet as an eating plan to promote health and prevent disease  What Is the Mediterranean Diet?  . Healthy eating plan based on typical foods and recipes of Mediterranean-style cooking . The diet is primarily a plant based diet; these foods should make up a majority of meals   Starches - Plant based foods should make up a majority of meals - They are an important sources of vitamins, minerals, energy, antioxidants, and fiber - Choose whole grains, foods high in fiber and minimally processed items  - Typical grain sources include wheat, oats, barley, corn, brown rice, bulgar, farro, millet, polenta, couscous  - Various types of beans include chickpeas, lentils, fava beans, black beans, white beans   Fruits  Veggies - Large quantities of antioxidant rich fruits & veggies; 6 or more servings  - Vegetables can be eaten raw or lightly drizzled with oil and cooked  - Vegetables common to the traditional Mediterranean Diet include: artichokes, arugula, beets, broccoli, brussel sprouts, cabbage, carrots, celery, collard greens, cucumbers, eggplant, kale, leeks, lemons, lettuce, mushrooms, okra, onions,  peas, peppers, potatoes, pumpkin, radishes, rutabaga, shallots, spinach, sweet potatoes, turnips, zucchini - Fruits common to the Mediterranean Diet include: apples, apricots, avocados, cherries, clementines, dates, figs, grapefruits, grapes, melons, nectarines, oranges, peaches, pears, pomegranates, strawberries, tangerines  Fats - Replace butter and margarine with healthy oils, such as  olive oil, canola oil, and tahini  - Limit nuts to no more than a handful a day  - Nuts include walnuts, almonds, pecans, pistachios, pine nuts  - Limit or avoid candied, honey roasted or heavily salted nuts - Olives are central to the [employer] - can be eaten whole or used in a variety of dishes   Meats Protein - Limiting red meat: no more than a few times a month - When eating red meat: choose lean cuts and keep the portion to the size of deck of cards - Eggs: approx. 0 to 4 times a week  - Fish and lean poultry: at least 2 a week  - Healthy protein sources include, chicken, [place], lean beef, lamb - Increase intake of seafood such as tuna, salmon, trout, mackerel, shrimp, scallops - Avoid or limit high fat processed meats such as sausage and bacon  Dairy - Include moderate amounts of low fat dairy products  - Focus on healthy dairy such as fat free yogurt, skim milk, low or reduced fat cheese - Limit dairy products higher in fat such as whole or 2% milk, cheese, ice cream  Alcohol - Moderate amounts of red wine is ok  - No more than 5 oz daily for women (all ages) and men older than age 45  - No more than 10 oz of wine daily for men younger than 40  Other - Limit sweets and other desserts  - Use herbs and spices instead of salt to flavor foods  - Herbs and spices common to the traditional Mediterranean Diet include: basil, bay leaves, chives, cloves, cumin, fennel, garlic, lavender, marjoram, mint, oregano, parsley, pepper, rosemary, sage, savory, sumac, tarragon, thyme   It's not just a diet, it's a lifestyle:  . The Mediterranean diet includes lifestyle factors typical of those in the region  . Foods, drinks and meals are best eaten with others and savored . Daily physical activity is important for overall good health . This could be strenuous exercise like running and aerobics . This could also be more leisurely activities such as walking, housework, yard-work, or taking the  stairs . Moderation is the key; a balanced and healthy diet accommodates most foods and drinks . Consider portion sizes and frequency of consumption of certain foods   Meal Ideas & Options:  . Breakfast:  o Whole wheat toast or whole wheat English muffins with peanut butter & hard boiled egg o Steel cut oats topped with apples & cinnamon and skim milk  o Fresh fruit: banana, strawberries, melon, berries, peaches  o Smoothies: strawberries, bananas, greek yogurt, peanut butter o Low fat greek yogurt with blueberries and granola  o Egg white omelet with spinach and mushrooms o Breakfast couscous: whole wheat couscous, apricots, skim milk, cranberries  . Sandwiches:  o Hummus and grilled vegetables (peppers, zucchini, squash) on whole wheat bread   o Grilled chicken on whole wheat pita with lettuce, tomatoes, cucumbers or tzatziki  o Tuna salad on whole wheat bread: tuna salad made with greek yogurt, olives, red peppers, capers, green onions o Garlic rosemary lamb pita: lamb sauted with garlic, rosemary, salt & pepper; add lettuce, cucumber, greek yogurt to [employer] - flavor  with lemon juice and black pepper  . Seafood:  o Mediterranean grilled salmon, seasoned with garlic, basil, parsley, lemon juice and black pepper o Shrimp, lemon, and spinach whole-grain pasta salad made with low fat greek yogurt  o Seared scallops with lemon orzo  o Seared tuna steaks seasoned salt, pepper, coriander topped with tomato mixture of olives, tomatoes, olive oil, minced garlic, parsley, green onions and cappers  . Meats:  o Herbed greek chicken salad with kalamata olives, cucumber, feta  o Red bell peppers stuffed with spinach, bulgur, lean ground beef (or lentils) & topped with feta   o Kebabs: skewers of chicken, tomatoes, onions, zucchini, squash  o [place] burgers: made with red onions, mint, dill, lemon juice, feta cheese topped with roasted red peppers . Vegetarian o Cucumber salad: cucumbers, artichoke  hearts, celery, red onion, feta cheese, tossed in olive oil & lemon juice  o Hummus and whole grain pita points with a greek salad (lettuce, tomato, feta, olives, cucumbers, red onion) o Lentil soup with celery, carrots made with vegetable broth, garlic, salt and pepper  o Tabouli salad: parsley, bulgur, mint, scallions, cucumbers, tomato, radishes, lemon juice, olive oil, salt and pepper.

## 2017-10-07 LAB — POCT GLYCOSYLATED HEMOGLOBIN (HGB A1C): Hemoglobin A1C: 6.2 % — AB (ref 4.0–5.6)

## 2017-10-11 ENCOUNTER — Other Ambulatory Visit: Payer: Self-pay | Admitting: Family Medicine

## 2017-10-11 DIAGNOSIS — I5032 Chronic diastolic (congestive) heart failure: Secondary | ICD-10-CM

## 2017-10-16 ENCOUNTER — Other Ambulatory Visit: Payer: Self-pay | Admitting: Cardiology

## 2017-10-19 ENCOUNTER — Other Ambulatory Visit: Payer: Self-pay | Admitting: *Deleted

## 2017-10-19 DIAGNOSIS — Z170 Estrogen receptor positive status [ER+]: Principal | ICD-10-CM

## 2017-10-19 DIAGNOSIS — C50412 Malignant neoplasm of upper-outer quadrant of left female breast: Secondary | ICD-10-CM

## 2017-10-19 NOTE — Progress Notes (Signed)
[place]  [phone] [phone]   ID: Patricia [name] OB: [birth year]  MR#: [id number]  KJZ#:[id number]  PCP: Mellody Dance, DO GYN:   SU: Stark Klein OTHER MD: Thea Silversmith, Charlena Cross  CHIEF COMPLAINT: Estrogen receptor positive breast cancer  TREATMENT: Anastrozole; denosumab/Prolia  BREAST CANCER HISTORY: From the initial intake note 06/14/2013:  Patricia [name] had screening mammography with tomography [facility] 05/26/2013 showing some architectural distortion in the left breast superiorly. Ultrasound of the left breast 06/02/2013 showed a 2 cm irregular mass superiorly in the left breast, medial to the scar on the skin from a prior biopsy. This mass was palpable. 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  On 06/13/2013 the patient underwent bilateral breast MRI. This showed a 2.8 cm irregular enhancing mass in the superior left breast but no additional suspicious masses, and no abnormal adenopathy, and no findings in the right breast.  The patient's subsequent history is as detailed below.  INTERVAL HISTORY: Patricia [name] returns today for follow-up and treatment of her estrogen receptor positive breast cancer. She continues on anastrozole, with good tolerance. She denies having hot flashes. She has some vaginal dryness, but it is not bothersome.   She also receive denosumab/ Prolia every 6 months, with a dose due today. She also tolerates this well without any complications.   Since her last visit, she underwent diagnostic bilateral mammography with CAD and tomography on 07/22/2017 at [facility] showing: breast density category D. There was no evidence of malignancy.   She also completed a bone density at [facility] on 07/22/2017 which  showed a T score of -1.9.  This is slightly improved.   REVIEW OF SYSTEMS: Patricia [name] reports that she plays golf and does house work for exercise. She denies unusual headaches, visual changes, nausea, vomiting, or dizziness. There has been no unusual cough, phlegm production, or pleurisy. There has been no change in bowel or bladder habits. She denies unexplained fatigue or unexplained weight loss, bleeding, rash, or fever. A detailed review of systems was otherwise stable.    PAST MEDICAL HISTORY: Past Medical History:  Diagnosis Date  . Allergic rhinitis, cause unspecified   . Aortic regurgitation 06/17/2015   Mild to moderate AR by echo 04/2015  . 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 Chronic cough   . Chronic diastolic heart failure ([place]) 06/17/2015  . CKD (chronic kidney disease), stage III ([place])   . Cough variant asthma   . Disease of pharynx or nasopharynx   . Diverticulosis   . Essential hypertension   . GERD (gastroesophageal reflux disease)   . Glaucoma   . Hiatal hernia   . Hyperlipidemia   . Internal hemorrhoids   . Laryngospasm   . Multinodular goiter   . Osteopenia   . Persistent atrial fibrillation ([place])    a. Dx 04/2015 at time of stroke. b. DCCV 06/2015 - did not hold. Amio started then stopped due to QT prolongation.  Marland Kitchen Postmenopausal   . Radiation 10/22/14-11/23/14   Left Breast  . Stricture and stenosis of cervix     PAST SURGICAL HISTORY: Past Surgical History:  Procedure Laterality Date  . BREAST SURGERY    . CARDIOVERSION N/A 06/24/2015   Procedure: CARDIOVERSION;  Surgeon:  Sueanne Margarita, MD;  Location: [place];  Service: Cardiovascular;  Laterality: N/A;  . CARDIOVERSION N/A 05/19/2016   Procedure: CARDIOVERSION;  Surgeon: Sueanne Margarita, MD;  Location[facility];  Service: Cardiovascular;  Laterality: N/A;  . COLONOSCOPY    . LUNG CANCER SURGERY  2003   resection carcinoid lingula-lt upper lobe   . RADIOACTIVE SEED GUIDED PARTIAL MASTECTOMY WITH AXILLARY SENTINEL LYMPH NODE BIOPSY Left 06/26/2014   Procedure: RADIOACTIVE SEED GUIDED PARTIAL MASTECTOMY WITH AXILLARY SENTINEL LYMPH NODE BIOPSY;  Surgeon: Stark Klein, MD;  Location: [place];  Service: General;  Laterality: Left;  . RE-EXCISION OF BREAST LUMPECTOMY Left 08/07/2014   Procedure: RE-EXCISION OF LEFT BREAST LUMPECTOMY;  Surgeon: Stark Klein, MD;  Location: [place];  Service: General;  Laterality: Left;  . RIGHT HEART CATH N/A 04/27/2016   Procedure: Right Heart Cath;  Surgeon: Larey Dresser, MD;  Location: [place] CV LAB;  Service: Cardiovascular;  Laterality: N/A;    FAMILY HISTORY Family History  Problem Relation Age of Onset  . Stroke Mother   . Hypertension Mother   . Hyperlipidemia Mother   . Stroke Father   . Transient ischemic attack Father   . Hyperlipidemia Father   . Hypertension Father   . Atrial fibrillation Son   . Heart attack Neg Hx    the patient's father died from pneumonia the age of 45, in the setting of dementia. The patient's mother died at the age of 62 following a stroke. The patient had one brother, 4 sisters. There is no history of breast or ovarian cancer in the family  GYNECOLOGIC HISTORY:  Menarche age 39, first live birth age 39, she is [place] P2. She did not use hormone replacement at menopause. She never took birth control pills.   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 Her husband [name] is retired from the [employer]) and then worked for [employer]. He is an avid Animator. Son [name] lives in [place] and is an Art gallery manager. Daughter [name] lives in [place] and is a Astronomer. The patient has 5 grandchildren. She attends the [employer]    ADVANCED DIRECTIVES: In place   HEALTH MAINTENANCE: Social History   Tobacco Use  . Smoking status:  Former Smoker    Packs/day: 0.75    Years: 5.00    Pack years: 3.75    Last attempt to quit: 06/14/1961    Years since quitting: 56.3  . Smokeless tobacco: Never Used  Substance Use Topics  . Alcohol use: Yes    Alcohol/week: 0.0 standard drinks    Comment: occasional wine  . Drug use: No     Colonoscopy:  PAP:  Bone density: 07/22/2017 [facility] which showed a T score of -1.9  Lipid panel:  Allergies  Allergen Reactions  . Combigan [Brimonidine Tartrate-Timolol] Itching    Eyes itch, reddened  . Other Other (See Comments)    Per patient made OU red, Sore, and sensitivity to light  . Sulfa Antibiotics Rash  . Sulfamethoxazole-Trimethoprim Hives    Current Outpatient Medications  Medication Sig Dispense Refill  . albuterol (PROVENTIL HFA;VENTOLIN HFA) 108 (90 Base) MCG/ACT inhaler Inhale 2 puffs into the lungs every 6 (six) hours as needed for wheezing or shortness of breath.    . anastrozole (ARIMIDEX) 1 MG tablet Take 1 tablet (1 mg total) by mouth daily. 90 tablet  3  . atorvastatin (LIPITOR) 40 MG tablet TAKE 1 TABLET DAILY 90 tablet 0  . digoxin (LANOXIN) 0.125 MG tablet TAKE 1 TABLET DAILY 90 tablet 3  . diltiazem (CARDIZEM CD) 180 MG 24 hr capsule TAKE 1 CAPSULE DAILY 90 capsule 2  . ELIQUIS 5 MG TABS tablet TAKE 1 TABLET TWICE A DAY 180 tablet 3  . fluticasone-salmeterol (ADVAIR HFA) 230-21 MCG/ACT inhaler USE 2 INHALATIONS TWICE A DAY 36 g 1  . furosemide (LASIX) 20 MG tablet TAKE 2 TABLETS EVERY MORNING AND 1 TABLET EVERY EVENING 270 tablet 3  . gabapentin (NEURONTIN) 400 MG capsule TAKE 1 CAPSULE TWICE A DAY 180 capsule 0  . KLOR-CON M20 20 MEQ tablet TAKE 1 TABLET TWICE A DAY 180 tablet 1  . loratadine (CLARITIN) 10 MG tablet Take 10 mg by mouth daily as needed for allergies. Reported on 09/16/2015    . Multiple Vitamins-Iron (MULTIVITAMIN/IRON) TABS Take 1 tablet by mouth daily.     Marland Kitchen omeprazole (PRILOSEC) 20 MG capsule TAKE 1 CAPSULE TWICE A DAY 30 MINUTES BEFORE  MEALS (BEFORE LUNCH AND DINNER) 180 capsule 0  . thyroid (ARMOUR THYROID) 60 MG tablet Take 1 tablet (60 mg total) by mouth daily before breakfast. 90 tablet 1  . Vitamin D, Ergocalciferol, (DRISDOL) 50000 units CAPS capsule 1 capsule every Wednesday and Sunday 24 capsule 3   No current facility-administered medications for this visit.    Facility-Administered Medications Ordered in Other Visits  Medication Dose Route Frequency Provider Last Rate Last Dose  . denosumab (PROLIA) injection 60 mg  60 mg Subcutaneous Once , Virgie Dad, MD        OBJECTIVE: Middle-aged white woman in no acute distress  Vitals:   10/20/17 0958  BP: 118/72  Pulse: 91  Resp: 18  Temp: 98.2 F (36.8 C)  SpO2: 98%     Body mass index is 26.68 kg/m.    ECOG FS:0 - Asymptomatic  Sclerae unicteric, EOMs intact Oropharynx clear and moist No cervical or supraclavicular adenopathy Lungs no rales or rhonchi Heart regular rate and rhythm Abd soft, nontender, positive bowel sounds MSK no focal spinal tenderness, no upper extremity lymphedema Neuro: nonfocal, well oriented, appropriate affect Breasts: Right breast is unremarkable.  The left breast status post lumpectomy and radiation.  There is no evidence of local recurrence.  Both axillae are benign.  LAB RESULTS:  CMP     Component Value Date/Time   NA 140 05/13/2017 0852   NA 141 09/22/2016 1459   K 4.4 05/13/2017 0852   K 3.8 09/22/2016 1459   CL 101 05/13/2017 0852   CO2 25 05/13/2017 0852   CO2 26 09/22/2016 1459   GLUCOSE 116 (H) 05/13/2017 0852   GLUCOSE 117 03/25/2017 1411   GLUCOSE 116 09/22/2016 1459   GLUCOSE 88 01/13/2006 1012   BUN 14 05/13/2017 0852   BUN 19.8 09/22/2016 1459   CREATININE 1.02 (H) 05/13/2017 0852   CREATININE 1.04 03/25/2017 1411   CREATININE 1.1 09/22/2016 1459   CALCIUM 8.4 (L) 05/13/2017 0852   CALCIUM 9.2 09/22/2016 1459   PROT 6.3 05/13/2017 0852   PROT 7.1 09/22/2016 1459   ALBUMIN 3.8 05/13/2017  0852   ALBUMIN 3.8 09/22/2016 1459   AST 17 05/13/2017 0852   AST 19 03/25/2017 1411   AST 15 09/22/2016 1459   ALT 25 05/13/2017 0852   ALT 28 03/25/2017 1411   ALT 21 09/22/2016 1459   ALKPHOS 96 05/13/2017 0852   ALKPHOS 98  09/22/2016 1459   BILITOT 0.6 05/13/2017 0852   BILITOT 0.7 03/25/2017 1411   BILITOT 0.65 09/22/2016 1459   GFRNONAA 54 (L) 05/13/2017 0852   GFRNONAA 51 (L) 03/25/2017 1411   GFRAA 62 05/13/2017 0852   GFRAA 59 (L) 03/25/2017 1411    I No results found for: SPEP  Lab Results  Component Value Date   WBC 7.5 10/20/2017   NEUTROABS 5.2 10/20/2017   HGB 15.1 10/20/2017   HCT 45.3 10/20/2017   MCV 96.2 10/20/2017   PLT 235 10/20/2017      Chemistry      Component Value Date/Time   NA 140 05/13/2017 0852   NA 141 09/22/2016 1459   K 4.4 05/13/2017 0852   K 3.8 09/22/2016 1459   CL 101 05/13/2017 0852   CO2 25 05/13/2017 0852   CO2 26 09/22/2016 1459   BUN 14 05/13/2017 0852   BUN 19.8 09/22/2016 1459   CREATININE 1.02 (H) 05/13/2017 0852   CREATININE 1.04 03/25/2017 1411   CREATININE 1.1 09/22/2016 1459      Component Value Date/Time   CALCIUM 8.4 (L) 05/13/2017 0852   CALCIUM 9.2 09/22/2016 1459   ALKPHOS 96 05/13/2017 0852   ALKPHOS 98 09/22/2016 1459   AST 17 05/13/2017 0852   AST 19 03/25/2017 1411   AST 15 09/22/2016 1459   ALT 25 05/13/2017 0852   ALT 28 03/25/2017 1411   ALT 21 09/22/2016 1459   BILITOT 0.6 05/13/2017 0852   BILITOT 0.7 03/25/2017 1411   BILITOT 0.65 09/22/2016 1459       No results found for: LABCA2  No components found for: LABCA125  No results for input(s): INR in the last 168 hours.  Urinalysis    Component Value Date/Time   COLORURINE YELLOW 04/28/2015 1114   APPEARANCEUR CLEAR 04/28/2015 1114   LABSPEC 1.010 04/28/2015 1114   PHURINE 6.5 04/28/2015 1114   GLUCOSEU NEGATIVE 04/28/2015 1114   HGBUR TRACE (A) 04/28/2015 1114   HGBUR negative 05/20/2007 0942   BILIRUBINUR NEGATIVE 04/28/2015  1114   KETONESUR NEGATIVE 04/28/2015 1114   PROTEINUR NEGATIVE 04/28/2015 1114   UROBILINOGEN 0.2 05/20/2007 0942   NITRITE NEGATIVE 04/28/2015 1114   LEUKOCYTESUR NEGATIVE 04/28/2015 1114    STUDIES: Since her last visit, she underwent diagnostic bilateral mammography with CAD and tomography on 07/22/2017 at [facility] showing: breast density category D. There was no evidence of malignancy.   She also completed a bone density at [facility] on 07/22/2017 which showed a T score of -1.9  ASSESSMENT: 77 y.o. [place] woman status post left breast upper outer quadrant biopsy 08/19/2013 for a clinical T2 N0, stage IIA invasive lobular breast cancer, estrogen receptor positive, progesterone receptor negative, with an MIB-1 of 20%. There was not sufficient tissue for HER-2 testing.  (1) neoadjuvant anastrozole started mid April 2015, discontinued early June 2015 with poor tolerance  (2) tamoxifen started first week in July 2015. Discontinued 06/20/15 after stroke.  (a) anastrozole resumed  07/01/2015   (3) osteopenia with a T score of -1.8 at the femoral necks 07/12/2013  (a) bone density at [facility]  07/09/2015 shows T score of -2.0  (b) Prolia started 09/24/2015  (c)  bone density on 07/22/2017 showed a T score of -1.9  (4) left lumpectomy 06/26/2014 showed a pT1c pN0, stage IA invasive lobular carcinoma, grade 1, HER-2 negative, with positive margins   (a) additional surgery 08/07/2014 did not completely clear margins, but no further surgery was advised  (5) radiation  completed 10/22/2014-11/23/2014 Site/dose:   Left breast/ 42.72 Gy at 2.67 Gy per fraction x 16 fractions.  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  This is very  favorable.  She is tolerating anastrozole well and the plan will be to continue that a total of 5 years.  She continues on Prolia every 6 months.  This is keeping her bone density stable, or actually slightly improved.  The plan is to continue the Prolia so long as she continues on anastrozole  The only concern I have is that her breasts are very dense.  This means the mammograms essentially useless.  I am going to write for breast MRI which will be able to see the right through the density and let us know if there is an early evidence of recurrence or a new breast cancer is developing  Otherwise she will see me again in a year.  She knows to call for any other issues that may develop before then.  Verdell [name], Virgie Dad, MD  10/20/17 10:11 AM Medical Oncology and Hematology [facility] [address] [place], [place] [address] Tel. [phone]    Fax. [phone]  Alice Rieger, am acting as scribe for Chauncey Cruel MD.  I, Lurline Del MD, have reviewed the above documentation for accuracy and completeness, and I agree with the above.

## 2017-10-20 ENCOUNTER — Inpatient Hospital Stay (HOSPITAL_BASED_OUTPATIENT_CLINIC_OR_DEPARTMENT_OTHER): Payer: Medicare Other | Admitting: Oncology

## 2017-10-20 ENCOUNTER — Inpatient Hospital Stay: Payer: Medicare Other | Attending: Oncology

## 2017-10-20 ENCOUNTER — Inpatient Hospital Stay: Payer: Medicare Other

## 2017-10-20 ENCOUNTER — Telehealth: Payer: Self-pay | Admitting: Oncology

## 2017-10-20 VITALS — BP 118/72 | HR 91 | Temp 98.2°F | Resp 18 | Ht 59.0 in | Wt 132.1 lb

## 2017-10-20 DIAGNOSIS — Z170 Estrogen receptor positive status [ER+]: Secondary | ICD-10-CM

## 2017-10-20 DIAGNOSIS — R922 Inconclusive mammogram: Secondary | ICD-10-CM

## 2017-10-20 DIAGNOSIS — Z79811 Long term (current) use of aromatase inhibitors: Secondary | ICD-10-CM

## 2017-10-20 DIAGNOSIS — C3410 Malignant neoplasm of upper lobe, unspecified bronchus or lung: Secondary | ICD-10-CM

## 2017-10-20 DIAGNOSIS — M8580 Other specified disorders of bone density and structure, unspecified site: Secondary | ICD-10-CM | POA: Diagnosis not present

## 2017-10-20 DIAGNOSIS — C50412 Malignant neoplasm of upper-outer quadrant of left female breast: Secondary | ICD-10-CM | POA: Diagnosis not present

## 2017-10-20 DIAGNOSIS — Z85118 Personal history of other malignant neoplasm of bronchus and lung: Secondary | ICD-10-CM

## 2017-10-20 LAB — CBC WITH DIFFERENTIAL (CANCER CENTER ONLY)
BASOS ABS: 0 10*3/uL (ref 0.0–0.1)
BASOS PCT: 0 %
EOS PCT: 0 %
Eosinophils Absolute: 0 10*3/uL (ref 0.0–0.5)
HCT: 45.3 % (ref 34.8–46.6)
Hemoglobin: 15.1 g/dL (ref 11.6–15.9)
Lymphocytes Relative: 23 %
Lymphs Abs: 1.7 10*3/uL (ref 0.9–3.3)
MCH: 32.1 pg (ref 25.1–34.0)
MCHC: 33.3 g/dL (ref 31.5–36.0)
MCV: 96.2 fL (ref 79.5–101.0)
MONO ABS: 0.5 10*3/uL (ref 0.1–0.9)
MONOS PCT: 7 %
Neutro Abs: 5.2 10*3/uL (ref 1.5–6.5)
Neutrophils Relative %: 70 %
PLATELETS: 235 10*3/uL (ref 145–400)
RBC: 4.71 MIL/uL (ref 3.70–5.45)
RDW: 13.5 % (ref 11.2–14.5)
WBC Count: 7.5 10*3/uL (ref 3.9–10.3)

## 2017-10-20 LAB — CMP (CANCER CENTER ONLY)
ALT: 28 U/L (ref 0–44)
ANION GAP: 9 (ref 5–15)
AST: 18 U/L (ref 15–41)
Albumin: 3.8 g/dL (ref 3.5–5.0)
Alkaline Phosphatase: 104 U/L (ref 38–126)
BILIRUBIN TOTAL: 0.7 mg/dL (ref 0.3–1.2)
BUN: 19 mg/dL (ref 8–23)
CHLORIDE: 104 mmol/L (ref 98–111)
CO2: 29 mmol/L (ref 22–32)
Calcium: 8.9 mg/dL (ref 8.9–10.3)
Creatinine: 0.98 mg/dL (ref 0.44–1.00)
GFR, EST NON AFRICAN AMERICAN: 54 mL/min — AB (ref >60)
GFR, Est AFR Am: >60 mL/min (ref >60)
Glucose, Bld: 121 mg/dL — ABNORMAL HIGH (ref 70–99)
Potassium: 3.7 mmol/L (ref 3.5–5.1)
Sodium: 142 mmol/L (ref 135–145)
TOTAL PROTEIN: 7.1 g/dL (ref 6.5–8.1)

## 2017-10-20 MED ORDER — ANASTROZOLE 1 MG PO TABS: 1.0000 mg | ORAL_TABLET | Freq: Every day | ORAL | 3 refills | Status: DC

## 2017-10-20 MED ORDER — DENOSUMAB 60 MG/ML ~~LOC~~ SOLN
60.0000 mg | Freq: Once | SUBCUTANEOUS | Status: AC
  Administered 2017-10-20: 60 mg via SUBCUTANEOUS

## 2017-10-20 NOTE — Telephone Encounter (Signed)
Gave patient avs and calendar.  She is going to be inquiring about cost of MRI.

## 2017-10-20 NOTE — Patient Instructions (Signed)

## 2017-10-23 ENCOUNTER — Other Ambulatory Visit: Payer: Self-pay | Admitting: Family Medicine

## 2017-10-23 DIAGNOSIS — E032 Hypothyroidism due to medicaments and other exogenous substances: Secondary | ICD-10-CM

## 2017-10-26 ENCOUNTER — Other Ambulatory Visit: Payer: Self-pay | Admitting: Cardiology

## 2017-11-03 ENCOUNTER — Other Ambulatory Visit: Payer: Self-pay

## 2017-11-03 MED ORDER — CALCIUM CARBONATE-VITAMIN D 500-200 MG-UNIT PO TABS: 1.0000 | ORAL_TABLET | Freq: Every day | ORAL | 0 refills | Status: DC

## 2017-11-05 ENCOUNTER — Other Ambulatory Visit: Payer: Medicare Other

## 2017-11-05 DIAGNOSIS — I481 Persistent atrial fibrillation: Secondary | ICD-10-CM | POA: Diagnosis not present

## 2017-11-05 DIAGNOSIS — I5032 Chronic diastolic (congestive) heart failure: Secondary | ICD-10-CM

## 2017-11-05 DIAGNOSIS — N183 Chronic kidney disease, stage 3 unspecified: Secondary | ICD-10-CM

## 2017-11-05 DIAGNOSIS — E032 Hypothyroidism due to medicaments and other exogenous substances: Secondary | ICD-10-CM

## 2017-11-05 DIAGNOSIS — E785 Hyperlipidemia, unspecified: Secondary | ICD-10-CM | POA: Diagnosis not present

## 2017-11-05 DIAGNOSIS — I10 Essential (primary) hypertension: Secondary | ICD-10-CM | POA: Diagnosis not present

## 2017-11-05 DIAGNOSIS — R413 Other amnesia: Secondary | ICD-10-CM

## 2017-11-05 DIAGNOSIS — M810 Age-related osteoporosis without current pathological fracture: Secondary | ICD-10-CM

## 2017-11-05 DIAGNOSIS — E559 Vitamin D deficiency, unspecified: Secondary | ICD-10-CM | POA: Diagnosis not present

## 2017-11-05 DIAGNOSIS — R7303 Prediabetes: Secondary | ICD-10-CM | POA: Diagnosis not present

## 2017-11-05 DIAGNOSIS — I4819 Other persistent atrial fibrillation: Secondary | ICD-10-CM

## 2017-11-05 DIAGNOSIS — I6389 Other cerebral infarction: Secondary | ICD-10-CM | POA: Diagnosis not present

## 2017-11-06 LAB — CBC
Hematocrit: 45.6 % (ref 34.0–46.6)
Hemoglobin: 15.4 g/dL (ref 11.1–15.9)
MCH: 31.8 pg (ref 26.6–33.0)
MCHC: 33.8 g/dL (ref 31.5–35.7)
MCV: 94 fL (ref 79–97)
PLATELETS: 263 10*3/uL (ref 150–450)
RBC: 4.85 x10E6/uL (ref 3.77–5.28)
RDW: 13.4 % (ref 12.3–15.4)
WBC: 6.1 10*3/uL (ref 3.4–10.8)

## 2017-11-06 LAB — LIPID PANEL
CHOL/HDL RATIO: 2.7 ratio (ref 0.0–4.4)
Cholesterol, Total: 145 mg/dL (ref 100–199)
HDL: 54 mg/dL (ref >39)
LDL CALC: 66 mg/dL (ref 0–99)
TRIGLYCERIDES: 123 mg/dL (ref 0–149)
VLDL Cholesterol Cal: 25 mg/dL (ref 5–40)

## 2017-11-06 LAB — COMPREHENSIVE METABOLIC PANEL
ALK PHOS: 98 IU/L (ref 39–117)
ALT: 26 IU/L (ref 0–32)
AST: 17 IU/L (ref 0–40)
Albumin/Globulin Ratio: 1.8 (ref 1.2–2.2)
Albumin: 4.4 g/dL (ref 3.5–4.8)
BUN/Creatinine Ratio: 19 (ref 12–28)
BUN: 18 mg/dL (ref 8–27)
Bilirubin Total: 0.8 mg/dL (ref 0.0–1.2)
CALCIUM: 8.8 mg/dL (ref 8.7–10.3)
CO2: 24 mmol/L (ref 20–29)
CREATININE: 0.97 mg/dL (ref 0.57–1.00)
Chloride: 103 mmol/L (ref 96–106)
GFR calc Af Amer: 65 mL/min/{1.73_m2} (ref >59)
GFR, EST NON AFRICAN AMERICAN: 57 mL/min/{1.73_m2} — AB (ref >59)
Globulin, Total: 2.4 g/dL (ref 1.5–4.5)
Glucose: 110 mg/dL — ABNORMAL HIGH (ref 65–99)
POTASSIUM: 4.6 mmol/L (ref 3.5–5.2)
Sodium: 143 mmol/L (ref 134–144)
Total Protein: 6.8 g/dL (ref 6.0–8.5)

## 2017-11-06 LAB — HEMOGLOBIN A1C
ESTIMATED AVERAGE GLUCOSE: 126 mg/dL
HEMOGLOBIN A1C: 6 % — AB (ref 4.8–5.6)

## 2017-11-06 LAB — SEDIMENTATION RATE: SED RATE: 5 mm/h (ref 0–40)

## 2017-11-06 LAB — RPR: RPR Ser Ql: NONREACTIVE

## 2017-11-06 LAB — T4, FREE: FREE T4: 1.06 ng/dL (ref 0.82–1.77)

## 2017-11-06 LAB — TSH: TSH: 1.72 u[IU]/mL (ref 0.450–4.500)

## 2017-11-06 LAB — VITAMIN D 25 HYDROXY (VIT D DEFICIENCY, FRACTURES): VIT D 25 HYDROXY: 93.9 ng/mL (ref 30.0–100.0)

## 2017-11-06 LAB — CALCIUM, IONIZED: Calcium, Ion: 4.7 mg/dL (ref 4.5–5.6)

## 2017-11-06 LAB — FOLATE: FOLATE: 9.3 ng/mL (ref >3.0)

## 2017-11-06 LAB — VITAMIN B12: Vitamin B-12: 403 pg/mL (ref 232–1245)

## 2017-11-06 LAB — MAGNESIUM: MAGNESIUM: 2.4 mg/dL — AB (ref 1.6–2.3)

## 2017-11-08 ENCOUNTER — Other Ambulatory Visit: Payer: Self-pay | Admitting: *Deleted

## 2017-11-08 DIAGNOSIS — Z1231 Encounter for screening mammogram for malignant neoplasm of breast: Secondary | ICD-10-CM

## 2017-11-20 ENCOUNTER — Other Ambulatory Visit: Payer: Self-pay | Admitting: Cardiology

## 2017-11-22 NOTE — Telephone Encounter (Signed)
Eliquis 5mg  refill request; pt is 77 yrs old, wt-59.9kg, Crea-0.97 on 11/15/17, last seen by Dr. Curt Bears on 05/31/17; will send in refill to requested pharmacy.

## 2017-11-23 ENCOUNTER — Other Ambulatory Visit: Payer: Self-pay | Admitting: Family Medicine

## 2017-11-23 DIAGNOSIS — I351 Nonrheumatic aortic (valve) insufficiency: Secondary | ICD-10-CM

## 2017-11-23 DIAGNOSIS — I4819 Other persistent atrial fibrillation: Secondary | ICD-10-CM

## 2017-11-23 DIAGNOSIS — E032 Hypothyroidism due to medicaments and other exogenous substances: Secondary | ICD-10-CM

## 2017-11-23 DIAGNOSIS — I10 Essential (primary) hypertension: Secondary | ICD-10-CM

## 2017-11-23 DIAGNOSIS — I5032 Chronic diastolic (congestive) heart failure: Secondary | ICD-10-CM

## 2017-11-25 ENCOUNTER — Ambulatory Visit
Admission: RE | Admit: 2017-11-25 | Discharge: 2017-11-25 | Disposition: A | Discharge status: Home / Self Care | Payer: Medicare Other | Source: Ambulatory Visit | Attending: Oncology | Admitting: Oncology

## 2017-11-25 DIAGNOSIS — Z1231 Encounter for screening mammogram for malignant neoplasm of breast: Secondary | ICD-10-CM

## 2017-11-25 DIAGNOSIS — Z803 Family history of malignant neoplasm of breast: Secondary | ICD-10-CM | POA: Diagnosis not present

## 2017-11-25 MED ORDER — GADOBENATE DIMEGLUMINE 529 MG/ML IV SOLN
12.0000 mL | Freq: Once | INTRAVENOUS | Status: AC | PRN
  Administered 2017-11-25: 12 mL via INTRAVENOUS

## 2017-11-26 ENCOUNTER — Telehealth: Payer: Self-pay | Admitting: Adult Health

## 2017-11-26 ENCOUNTER — Other Ambulatory Visit: Payer: Self-pay | Admitting: Adult Health

## 2017-11-26 DIAGNOSIS — I517 Cardiomegaly: Secondary | ICD-10-CM

## 2017-11-26 NOTE — Telephone Encounter (Signed)
Called and reviewed MRI breast results.  No cancer in bilateral breasts.  Concern for cardiomegaly noted on MRI.  Will repeat chest xray for comparison to 2017 heart.  Patient understands and will come next week.   Wilber Bihari NP

## 2017-11-29 ENCOUNTER — Other Ambulatory Visit: Payer: Self-pay | Admitting: Cardiology

## 2017-12-01 ENCOUNTER — Ambulatory Visit (HOSPITAL_COMMUNITY)
Admission: RE | Admit: 2017-12-01 | Discharge: 2017-12-01 | Disposition: A | Discharge status: Home / Self Care | Payer: Medicare Other | Source: Ambulatory Visit | Attending: Adult Health | Admitting: Adult Health

## 2017-12-01 DIAGNOSIS — I517 Cardiomegaly: Secondary | ICD-10-CM | POA: Diagnosis not present

## 2017-12-01 DIAGNOSIS — R918 Other nonspecific abnormal finding of lung field: Secondary | ICD-10-CM | POA: Diagnosis not present

## 2017-12-02 ENCOUNTER — Telehealth: Payer: Self-pay | Admitting: Adult Health

## 2017-12-02 NOTE — Telephone Encounter (Signed)
Called patient and left message at her house to please call me back.  Was planning on reviewing her chest xray results with her and that they her heart size is stable when compared to the chest xray from 2017.  No other areas of concern noted.  Wilber Bihari, NP

## 2017-12-06 ENCOUNTER — Telehealth: Payer: Self-pay

## 2017-12-06 NOTE — Telephone Encounter (Signed)
Per [facility] note on 12/02/17 reviewed xray result with pt.  Pt voiced understanding.  No further questions or concerns.

## 2017-12-14 ENCOUNTER — Encounter: Payer: Self-pay | Admitting: Family Medicine

## 2017-12-14 ENCOUNTER — Ambulatory Visit (INDEPENDENT_AMBULATORY_CARE_PROVIDER_SITE_OTHER): Payer: Medicare Other | Admitting: Family Medicine

## 2017-12-14 VITALS — BP 109/76 | HR 76 | Ht 59.0 in | Wt 131.6 lb

## 2017-12-14 DIAGNOSIS — N183 Chronic kidney disease, stage 3 unspecified: Secondary | ICD-10-CM

## 2017-12-14 DIAGNOSIS — Z23 Encounter for immunization: Secondary | ICD-10-CM | POA: Diagnosis not present

## 2017-12-14 DIAGNOSIS — I10 Essential (primary) hypertension: Secondary | ICD-10-CM

## 2017-12-14 DIAGNOSIS — C50412 Malignant neoplasm of upper-outer quadrant of left female breast: Secondary | ICD-10-CM | POA: Diagnosis not present

## 2017-12-14 DIAGNOSIS — E785 Hyperlipidemia, unspecified: Secondary | ICD-10-CM | POA: Diagnosis not present

## 2017-12-14 DIAGNOSIS — Z85118 Personal history of other malignant neoplasm of bronchus and lung: Secondary | ICD-10-CM | POA: Diagnosis not present

## 2017-12-14 DIAGNOSIS — Z170 Estrogen receptor positive status [ER+]: Secondary | ICD-10-CM

## 2017-12-14 DIAGNOSIS — R7303 Prediabetes: Secondary | ICD-10-CM

## 2017-12-14 DIAGNOSIS — E032 Hypothyroidism due to medicaments and other exogenous substances: Secondary | ICD-10-CM

## 2017-12-14 DIAGNOSIS — I6389 Other cerebral infarction: Secondary | ICD-10-CM | POA: Diagnosis not present

## 2017-12-14 DIAGNOSIS — I4819 Other persistent atrial fibrillation: Secondary | ICD-10-CM

## 2017-12-14 NOTE — Progress Notes (Signed)
Assessment and plan:  1. CKD (chronic kidney disease), stage III ([place])   2. Essential hypertension   3. Hyperlipidemia, unspecified hyperlipidemia type   4. Prediabetes   5. Persistent atrial fibrillation   6. Hypothyroidism due to medication   7. Malignant neoplasm of upper-outer quadrant of left breast in female, estrogen receptor positive ([place])   8. LUNG CANCER, HX OF   9. Cerebrovascular accident (CVA) due to other mechanism ([place])   10. Flu vaccine need     1. Reviewed recent lab work (11/05/2017) in depth with patient today.  All lab work within normal limits unless otherwise noted.  2. CKD (chronic kidney disease), stage III (HCC) - GFR = 57. - Encouraged patient to drink more water and prudently hydrate.  3. Essential hypertension - Stable at this time. - Continue treatment plan as prescribed.  See med list below. - Patient tolerating meds well without complication.  Denies S-E  4. Hyperlipidemia, unspecified hyperlipidemia type Triglycerides = 123 LDL = 66 HDL = 54  - Stable at this time. - Continue treatment plan as prescribed.  See med list below. - Patient tolerating meds well without complication.  Denies S-E  - Dietary changes such as low saturated & trans fat and low carb/ ketogenic diets discussed with patient.  Encouraged regular cardiovascular exercise and weight loss when appropriate.   Educational handouts provided at patient's desire.  5. Vitamin D - Up to 93.9, stable at this time. - Reviewed that we do not want her value to increase too far above current value. - Advised patient to continue habits and treatment as established.  However, do not take any other Vitamin D besides the prescription and multivitamin.  6. Persistent atrial fibrillation - Stable at this time. - Continue treatment plan as prescribed.  See med list today. - Continue to have treatment plan managed by  specialists as recommended.  7. Hypothyroidism due to medication - Stable at this time. - Continue treatment plan as prescribed.  See med list below. - Patient tolerating meds well without complication.  Denies S-E  8. Magnesium - slightly elevated - 2.4, up from 2.1 two years ago. - Advised patient to continue eating her regular diet.   9. HbA1c - Prediabetes - Down to 6.0 from 6.2 last check 2 months ago. - Advised and educated patient at length about importance of keeping A1c down.  - Counseled patient on prevention of disease and discussed various treatment options, which often includes prudent dietary and lifestyle modifications as first line.  Importance of low carb/ketogenic diet discussed with patient in addition to regular exercise.   - Continue to exercise and eat more healthfully.  10. BMI Counseling Explained to patient what BMI refers to, and what it means medically.    Told patient to think about it as a "medical risk stratification measurement" and how increasing BMI is associated with increasing risk/ or worsening state of various diseases such as hypertension, hyperlipidemia, diabetes, premature OA, depression etc.  American Heart Association guidelines for healthy diet, basically Mediterranean diet, and exercise guidelines of 30 minutes 5 days per week or more discussed in detail.  Health counseling performed.  All questions answered.  11. Lifestyle & Preventative Health Maintenance - Advised patient to continue working toward exercising to improve overall mental, physical, and emotional health.   - Encouraged patient to continue taking a multivitamin such as Centrum Silver.   - Encouraged patient to engage in daily physical activity,  especially a formal exercise routine.  Recommended that the patient eventually strive for at least 150 minutes of moderate cardiovascular activity per week according to guidelines established by the [facility].   - Healthy dietary habits  encouraged, including low-carb, and high amounts of lean protein in diet.   - Patient should also be sure to consume adequate amounts of water.   Education and routine counseling performed. Handouts provided.  12. Follow-Up - Prescriptions refilled today PRN. - Continue to follow up with specialists as established. - Otherwise, continue to return for CPE and chronic follow-up in 4 months.   - Patient knows to call in sooner if desired to address acute concerns.   Orders Placed This Encounter  Procedures  . Flu vaccine HIGH DOSE PF (Fluzone High dose)    Return for Hypertension, Pre-Dm, Chol follow up every 4 mo.   Anticipatory guidance and routine counseling done re: condition, txmnt options and need for follow up. All questions of patient's were answered.   Gross side effects, risk and benefits, and alternatives of medications discussed with patient.  Patient is aware that all medications have potential side effects and we are unable to predict every sideeffect or drug-drug interaction that may occur.  Expresses verbal understanding and consents to current therapy plan and treatment regiment.  Please see AVS handed out to patient at the end of our visit for additional patient instructions/ counseling done pertaining to today's office visit.  Note:  This document was prepared using Dragon voice recognition software and may include unintentional dictation errors.    This document serves as a record of services personally performed by Mellody Dance, DO. It was created on her behalf by [name], a trained medical scribe. The creation of this record is based on the scribe's personal observations and the provider's statements to them.   I have reviewed the above medical documentation for accuracy and completeness and I concur.  Mellody Dance 12/14/17 5:21  PM   ----------------------------------------------------------------------------------------------------------------------  Subjective:   CC:   Patricia [name] is a 77 y.o. female who presents to [place] [facility] today for review and discussion of recent bloodwork that was done in addition to f/up on chronic conditions we are managing for pt.  1. All recent blood work that we ordered was reviewed with patient today.  Patient was counseled on all abnormalities and we discussed dietary and lifestyle changes that could help those values (also medications when appropriate).  Extensive health counseling performed and all patient's concerns/ questions were addressed.  See labs below and also plan for more details of these abnormalities  Lifestyle & Exercise States she's been good.  Still golfing.  Patient is taking prescriptions as recommended. She also takes a multivitamin, Centrum Silver.  1. HTN HPI:  -  Her blood pressure may or may not be controlled at home.  Pt is not checking her blood pressure at home.  Patient is asymptomatic.  - Patient reports good compliance with blood pressure medications  - Denies medication S-E   - Smoking Status noted   - She denies new onset of: chest pain, exercise intolerance, shortness of breath, dizziness, visual changes, headache, lower extremity swelling or claudication.   Last 3 blood pressure readings in our office are as follows: BP Readings from Last 3 Encounters:  12/14/17 109/76  10/20/17 118/72  09/27/17 135/83    Filed Weights   12/14/17 1508  Weight: 131 lb 9.6 oz (59.7 kg)    Wt  Readings from Last 3 Encounters:  12/14/17 131 lb 9.6 oz (59.7 kg)  10/20/17 132 lb 1.6 oz (59.9 kg)  09/27/17 130 lb 8 oz (59.2 kg)   BP Readings from Last 3 Encounters:  12/14/17 109/76  10/20/17 118/72  09/27/17 135/83   Pulse Readings from Last 3 Encounters:  12/14/17 76  10/20/17 91  09/27/17 80   BMI Readings from  Last 3 Encounters:  12/14/17 26.58 kg/m  10/20/17 26.68 kg/m  09/27/17 26.36 kg/m     Patient Care Team    Relationship Specialty Notifications Start End  Mellody Dance, DO PCP - General Family Medicine  09/08/16   Thea Silversmith, MD Consulting Physician Radiation Oncology  11/29/14   Magrinat, Virgie Dad, MD Consulting Physician Oncology  11/29/14   Stark Klein, MD Consulting Physician General Surgery  11/29/14   Sylvan Cheese, NP Nurse Practitioner Nurse Practitioner  11/29/14    Comment: Survivorship  Ladene Artist, MD Consulting Physician Gastroenterology  02/14/15   Sueanne Margarita, MD Consulting Physician Cardiology  09/08/16   Marygrace Drought, MD Consulting Physician Ophthalmology  01/06/17    Comment: Sees him for glaucoma    Full medical history updated and reviewed in the office today  Patient Active Problem List   Diagnosis Date Noted  . Prediabetes 01/06/2017    Priority: High  . CKD (chronic kidney disease), stage III ([place]) 10/30/2016    Priority: High  . Persistent atrial fibrillation     Priority: High  . Chronic diastolic heart failure ([place]) 06/17/2015    Priority: High  . Essential hypertension 06/17/2015    Priority: High  . CVA (cerebral vascular accident) ([place]) 04/30/2015    Priority: High  . Hyperlipidemia 06/10/2009    Priority: High  . Lung cancer, lingula ([place]) 09/08/2016    Priority: Medium  . 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 Malignant neoplasm of upper-outer quadrant of left breast in female, estrogen receptor positive ([place]) 06/08/2013    Priority: Medium  . Multinodular goiter 01/11/2013    Priority: Medium  . 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 ESOPHAGEAL STRICTURE 09/26/2009    Priority: Medium  . GERD 09/26/2009    Priority: Medium  . Cough variant asthma 06/27/2007    Priority: Medium  . Chronic cough 01/21/2007    Priority: Medium  . LUNG CANCER, HX OF 07/29/2006     Priority: Medium  . Arthritis 01/27/2017    Priority: Low  . Vitamin D deficiency 01/06/2017    Priority: Low  . Postmenopausal bone loss 01/06/2017    Priority: Low  . Osteopenia 05/28/2007    Priority: Low  . Cerebrovascular accident ([place]) 09/27/2017  . Memory changes 09/27/2017  . Hypocalcemia 05/27/2017  . h/o Radiation 04/14/2017  . High risk medication use 04/14/2017  . History of CVA (cerebrovascular accident) 05/14/2016  . Abnormal PFT 10/24/2015  . Aortic regurgitation 06/17/2015  . CVA (cerebral infarction)   . Pulmonary edema 04/27/2015  . General medical examination 06/09/2010  . Screening for malignant neoplasm of the cervix 06/09/2010  . HEARING LOSS, UNSPEC. 08/15/2009  . Diverticulosis of large intestine 06/10/2009  . GLAUCOMA, BILATERAL 02/07/2009  . STRICTURE AND STENOSIS OF CERVIX 05/06/2007  . POSTMENOPAUSAL STATUS 05/06/2007    Past Medical History:  Diagnosis Date  . Allergic rhinitis, cause unspecified   . Aortic regurgitation 06/17/2015   Mild to moderate AR by echo 04/2015  . Arthritis    in  my fingers  . 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 Chronic cough   . Chronic diastolic heart failure ([place]) 06/17/2015  . CKD (chronic kidney disease), stage III ([place])   . Cough variant asthma   . Disease of pharynx or nasopharynx   . Diverticulosis   . Essential hypertension   . GERD (gastroesophageal reflux disease)   . Glaucoma   . Hiatal hernia   . Hyperlipidemia   . Internal hemorrhoids   . Laryngospasm   . Multinodular goiter   . Osteopenia   . Persistent atrial fibrillation    a. Dx 04/2015 at time of stroke. b. DCCV 06/2015 - did not hold. Amio started then stopped due to QT prolongation.  Marland Kitchen Postmenopausal   . Radiation 10/22/14-11/23/14   Left Breast  . Stricture and stenosis of cervix     Past Surgical History:  Procedure Laterality Date  . BREAST SURGERY    . CARDIOVERSION N/A 06/24/2015   Procedure: CARDIOVERSION;   Surgeon: Sueanne Margarita, MD;  Location: [facility];  Service: Cardiovascular;  Laterality: N/A;  . CARDIOVERSION N/A 05/19/2016   Procedure: CARDIOVERSION;  Surgeon: Sueanne Margarita, MD;  [facility];  Service: Cardiovascular;  Laterality: N/A;  . COLONOSCOPY    . LUNG CANCER SURGERY  2003   resection carcinoid lingula-lt upper lobe  . RADIOACTIVE SEED GUIDED PARTIAL MASTECTOMY WITH AXILLARY SENTINEL LYMPH NODE BIOPSY Left 06/26/2014   Procedure: RADIOACTIVE SEED GUIDED PARTIAL MASTECTOMY WITH AXILLARY SENTINEL LYMPH NODE BIOPSY;  Surgeon: Stark Klein, MD;  Location: [facility];  Service: General;  Laterality: Left;  . RE-EXCISION OF BREAST LUMPECTOMY Left 08/07/2014   Procedure: RE-EXCISION OF LEFT BREAST LUMPECTOMY;  Surgeon: Stark Klein, MD;  Location: [facility];  Service: General;  Laterality: Left;  . RIGHT HEART CATH N/A 04/27/2016   Procedure: Right Heart Cath;  Surgeon: Larey Dresser, MD;  Location: [facility];  Service: Cardiovascular;  Laterality: N/A;    Social History   Tobacco Use  . Smoking status: Former Smoker    Packs/day: 0.75    Years: 5.00    Pack years: 3.75    Last attempt to quit: 06/14/1961    Years since quitting: 56.5  . Smokeless tobacco: Never Used  Substance Use Topics  . Alcohol use: Yes    Alcohol/week: 0.0 standard drinks    Comment: occasional wine    Family Hx: Family History  Problem Relation Age of Onset  . Stroke Mother   . Hypertension Mother   . Hyperlipidemia Mother   . Stroke Father   . Transient ischemic attack Father   . Hyperlipidemia Father   . Hypertension Father   . Atrial fibrillation Son   . Heart attack Neg Hx      Medications: Current Outpatient Medications  Medication Sig Dispense Refill  . albuterol (PROVENTIL HFA;VENTOLIN HFA) 108 (90 Base) MCG/ACT inhaler Inhale 2 puffs into the lungs every 6 (six) hours as needed for wheezing or shortness of breath.    .  anastrozole (ARIMIDEX) 1 MG tablet Take 1 tablet (1 mg total) by mouth daily. 90 tablet 3  . atorvastatin (LIPITOR) 40 MG tablet TAKE 1 TABLET DAILY 90 tablet 0  . calcium-vitamin D (OSCAL WITH D) 500-200 MG-UNIT tablet Take 1 tablet by mouth daily with breakfast. 30 tablet 0  . digoxin (LANOXIN) 0.125 MG tablet TAKE 1 TABLET DAILY 90 tablet 3  . diltiazem (CARDIZEM  CD) 180 MG 24 hr capsule Take 1 capsule (180 mg total) by mouth daily. Please make overdue yearly appt with Dr. Radford Pax for October before anymore refills.1st attempt 30 capsule 0  . ELIQUIS 5 MG TABS tablet TAKE 1 TABLET TWICE A DAY 180 tablet 3  . fluticasone-salmeterol (ADVAIR HFA) 230-21 MCG/ACT inhaler USE 2 INHALATIONS TWICE A DAY 36 g 1  . furosemide (LASIX) 20 MG tablet TAKE 2 TABLETS EVERY MORNING AND 1 TABLET EVERY EVENING 270 tablet 3  . gabapentin (NEURONTIN) 400 MG capsule TAKE 1 CAPSULE TWICE A DAY 180 capsule 0  . loratadine (CLARITIN) 10 MG tablet Take 10 mg by mouth daily as needed for allergies. Reported on 09/16/2015    . Multiple Vitamins-Iron (MULTIVITAMIN/IRON) TABS Take 1 tablet by mouth daily.     Marland Kitchen omeprazole (PRILOSEC) 20 MG capsule TAKE 1 CAPSULE TWICE A DAY 30 MINUTES BEFORE MEALS (BEFORE LUNCH AND DINNER) 180 capsule 0  . potassium chloride SA (K-DUR,KLOR-CON) 20 MEQ tablet Take 1 tablet (20 mEq total) by mouth 2 (two) times daily. Please make yearly appt with Dr.Turner for October for future refills.1st attempt 180 tablet 0  . thyroid (ARMOUR THYROID) 60 MG tablet Take 1 tablet (60 mg total) by mouth daily before breakfast. 90 tablet 0  . Vitamin D, Ergocalciferol, (DRISDOL) 50000 units CAPS capsule 1 capsule every Wednesday and Sunday 24 capsule 3   No current facility-administered medications for this visit.    Facility-Administered Medications Ordered in Other Visits  Medication Dose Route Frequency Provider Last Rate Last Dose  . denosumab (PROLIA) injection 60 mg  60 mg Subcutaneous Once Magrinat,  Virgie Dad, MD        Allergies:  Allergies  Allergen Reactions  . Combigan [Brimonidine Tartrate-Timolol] Itching    Eyes itch, reddened  . Other Other (See Comments)    Per patient made OU red, Sore, and sensitivity to light  . Sulfa Antibiotics Rash  . Sulfamethoxazole-Trimethoprim Hives     Review of Systems: General:   No F/C, wt loss Pulm:   No DIB, SOB, pleuritic chest pain Card:  No CP, palpitations Abd:  No n/v/d or pain Ext:  No inc edema from baseline  Objective:  Blood pressure 109/76, pulse 76, height 4' 11"  (1.499 m), weight 131 lb 9.6 oz (59.7 kg), SpO2 98 %. Body mass index is 26.58 kg/m. Gen:   Well NAD, A and O *3 HEENT:    [place]/AT, EOMI,  MMM Lungs:   Normal work of breathing. CTA B/L, no Wh, rhonchi Heart:   Irregularly irregular, S1, S2 WNL's, no MRG Abd:   No gross distention Exts:    warm, pink,  Brisk capillary refill, warm and well perfused.  Psych:    No HI/SI, judgement and insight good, Euthymic mood. Full Affect.   Recent Results (from the past 2160 hour(s))  POCT glycosylated hemoglobin (Hb A1C)     Status: Abnormal   Collection Time: 10/07/17  1:36 PM  Result Value Ref Range   Hemoglobin A1C 6.2 (A) 4.0 - 5.6 %   HbA1c POC (<> result, manual entry)     HbA1c, POC (prediabetic range)     HbA1c, POC (controlled diabetic range)    CBC with Differential (Cancer Center Only)     Status: None   Collection Time: 10/20/17  9:38 AM  Result Value Ref Range   WBC Count 7.5 3.9 - 10.3 K/uL   RBC 4.71 3.70 - 5.45 MIL/uL   Hemoglobin 15.1 11.6 -  15.9 g/dL   HCT 45.3 34.8 - 46.6 %   MCV 96.2 79.5 - 101.0 fL   MCH 32.1 25.1 - 34.0 pg   MCHC 33.3 31.5 - 36.0 g/dL   RDW 13.5 11.2 - 14.5 %   Platelet Count 235 145 - 400 K/uL   Neutrophils Relative % 70 %   Neutro Abs 5.2 1.5 - 6.5 K/uL   Lymphocytes Relative 23 %   Lymphs Abs 1.7 0.9 - 3.3 K/uL   Monocytes Relative 7 %   Monocytes Absolute 0.5 0.1 - 0.9 K/uL   Eosinophils Relative 0 %    Eosinophils Absolute 0.0 0.0 - 0.5 K/uL   Basophils Relative 0 %   Basophils Absolute 0.0 0.0 - 0.1 K/uL    Comment: Performed at [facility], [place] [address], [place], [place] [address]  CMP ([place] only)     Status: Abnormal   Collection Time: 10/20/17  9:38 AM  Result Value Ref Range   Sodium 142 135 - 145 mmol/L   Potassium 3.7 3.5 - 5.1 mmol/L   Chloride 104 98 - 111 mmol/L   CO2 29 22 - 32 mmol/L   Glucose, Bld 121 (H) 70 - 99 mg/dL   BUN 19 8 - 23 mg/dL   Creatinine 0.98 0.44 - 1.00 mg/dL   Calcium 8.9 8.9 - 10.3 mg/dL   Total Protein 7.1 6.5 - 8.1 g/dL   Albumin 3.8 3.5 - 5.0 g/dL   AST 18 15 - 41 U/L   ALT 28 0 - 44 U/L   Alkaline Phosphatase 104 38 - 126 U/L   Total Bilirubin 0.7 0.3 - 1.2 mg/dL   GFR, Est Non Af Am 54 (L) >60 mL/min   GFR, Est AFR Am >60 >60 mL/min    Comment: (NOTE) The eGFR has been calculated using the CKD EPI equation. This calculation has not been validated in all clinical situations. eGFR's persistently <60 mL/min signify possible Chronic Kidney Disease.    Anion gap 9 5 - 15    Comment: Performed at [facility], [address] [place], [place] [address]  Calcium, ionized     Status: None   Collection Time: 11/05/17  8:42 AM  Result Value Ref Range   Calcium, Ion 4.7 4.5 - 5.6 mg/dL  Lipid panel     Status: None   Collection Time: 11/05/17  8:42 AM  Result Value Ref Range   Cholesterol, Total 145 100 - 199 mg/dL   Triglycerides 123 0 - 149 mg/dL   HDL 54 >39 mg/dL   VLDL Cholesterol Cal 25 5 - 40 mg/dL   LDL Calculated 66 0 - 99 mg/dL   Chol/HDL Ratio 2.7 0.0 - 4.4 ratio    Comment:                                   T. Chol/HDL Ratio                                             Men  Women                               1/2 Avg.Risk  3.4    3.3  Avg.Risk  5.0    4.4                                2X Avg.Risk  9.6    7.1                                 3X Avg.Risk 23.4   11.0   Folate     Status: None   Collection Time: 11/05/17  8:42 AM  Result Value Ref Range   Folate 9.3 >3.0 ng/mL    Comment: A serum folate concentration of less than 3.1 ng/mL is considered to represent clinical deficiency.   TSH     Status: None   Collection Time: 11/05/17  8:42 AM  Result Value Ref Range   TSH 1.720 0.450 - 4.500 uIU/mL  CBC     Status: None   Collection Time: 11/05/17  8:42 AM  Result Value Ref Range   WBC 6.1 3.4 - 10.8 x10E3/uL   RBC 4.85 3.77 - 5.28 x10E6/uL   Hemoglobin 15.4 11.1 - 15.9 g/dL   Hematocrit 45.6 34.0 - 46.6 %   MCV 94 79 - 97 fL   MCH 31.8 26.6 - 33.0 pg   MCHC 33.8 31.5 - 35.7 g/dL   RDW 13.4 12.3 - 15.4 %   Platelets 263 150 - 450 x10E3/uL  Sedimentation rate     Status: None   Collection Time: 11/05/17  8:42 AM  Result Value Ref Range   Sed Rate 5 0 - 40 mm/hr  Vitamin B12     Status: None   Collection Time: 11/05/17  8:42 AM  Result Value Ref Range   Vitamin B-12 403 232 - 1,245 pg/mL  RPR     Status: None   Collection Time: 11/05/17  8:42 AM  Result Value Ref Range   RPR Ser Ql Non Reactive Non Reactive  Hemoglobin A1c     Status: Abnormal   Collection Time: 11/05/17  8:42 AM  Result Value Ref Range   Hgb A1c MFr Bld 6.0 (H) 4.8 - 5.6 %    Comment:          Prediabetes: 5.7 - 6.4          Diabetes: >6.4          Glycemic control for adults with diabetes: <7.0    Est. average glucose Bld gHb Est-mCnc 126 mg/dL  Comprehensive metabolic panel     Status: Abnormal   Collection Time: 11/05/17  8:42 AM  Result Value Ref Range   Glucose 110 (H) 65 - 99 mg/dL   BUN 18 8 - 27 mg/dL   Creatinine, Ser 0.97 0.57 - 1.00 mg/dL   GFR calc non Af Amer 57 (L) >59 mL/min/1.73   GFR calc Af Amer 65 >59 mL/min/1.73   BUN/Creatinine Ratio 19 12 - 28   Sodium 143 134 - 144 mmol/L   Potassium 4.6 3.5 - 5.2 mmol/L   Chloride 103 96 - 106 mmol/L   CO2 24 20 - 29 mmol/L   Calcium 8.8 8.7 - 10.3 mg/dL   Total  Protein 6.8 6.0 - 8.5 g/dL   Albumin 4.4 3.5 - 4.8 g/dL   Globulin, Total 2.4 1.5 - 4.5 g/dL   Albumin/Globulin Ratio 1.8 1.2 - 2.2   Bilirubin Total 0.8 0.0 - 1.2 mg/dL   Alkaline Phosphatase  98 39 - 117 IU/L   AST 17 0 - 40 IU/L   ALT 26 0 - 32 IU/L  Magnesium     Status: Abnormal   Collection Time: 11/05/17  8:42 AM  Result Value Ref Range   Magnesium 2.4 (H) 1.6 - 2.3 mg/dL  VITAMIN D 25 Hydroxy (Vit-D Deficiency, Fractures)     Status: None   Collection Time: 11/05/17  8:42 AM  Result Value Ref Range   Vit D, 25-Hydroxy 93.9 30.0 - 100.0 ng/mL    Comment: Vitamin D deficiency has been defined by the [place] practice guideline as a level of serum 25-OH vitamin D less than 20 ng/mL (1,2). The Endocrine Society went on to further define vitamin D insufficiency as a level between 21 and 29 ng/mL (2). 1. IOM (Institute of Medicine). 2010. Dietary reference    intakes for calcium and D. [place]: The    [employer]. 2. Holick MF, Binkley [place], Bischoff-Ferrari HA, et al.    Evaluation, treatment, and prevention of vitamin D    deficiency: an Endocrine Society clinical practice    guideline. JCEM. 2011 Jul; 96(7):1911-30.   T4, free     Status: None   Collection Time: 11/05/17  8:42 AM  Result Value Ref Range   Free T4 1.06 0.82 - 1.77 ng/dL

## 2017-12-14 NOTE — Patient Instructions (Signed)
patient needs her flu vaccine.  -[name] keep up the good work with controlling your cholesterol and blood sugars as well as blood pressures.  Please continue to keep active and eating a heart healthy diet.  Please realize, EXERCISE IS MEDICINE!  -  American Heart Association [facility]) guidelines for exercise : If you are in good health, without any medical conditions, you should engage in 150 minutes of moderate intensity aerobic activity per week.  This means you should be huffing and puffing throughout your workout.   Engaging in regular exercise will improve brain function and memory, as well as improve mood, boost immune system and help with weight management.  As well as the other, more well-known effects of exercise such as decreasing blood sugar levels, decreasing blood pressure,  and decreasing bad cholesterol levels/ increasing good cholesterol levels.     -  The AHA strongly endorses consumption of a diet that contains a variety of foods from all the food categories with an emphasis on fruits and vegetables; fat-free and low-fat dairy products; cereal and grain products; legumes and nuts; and fish, poultry, and/or extra lean meats.    Excessive food intake, especially of foods high in saturated and trans fats, sugar, and salt, should be avoided.    Adequate water intake of roughly 1/2 of your weight in pounds, should equal the ounces of water per day you should drink.  So for instance, if you're 200 pounds, that would be 100 ounces of water per day.         Mediterranean Diet  Why follow it? Research shows. . Those who follow the Mediterranean diet have a reduced risk of heart disease  . The diet is associated with a reduced incidence of Parkinson's and Alzheimer's diseases . People following the diet may have longer life expectancies and lower rates of chronic diseases  . The Dietary Guidelines for Americans recommends the Mediterranean diet as an eating plan to promote health and  prevent disease  What Is the Mediterranean Diet?  . Healthy eating plan based on typical foods and recipes of Mediterranean-style cooking . The diet is primarily a plant based diet; these foods should make up a majority of meals   Starches - Plant based foods should make up a majority of meals - They are an important sources of vitamins, minerals, energy, antioxidants, and fiber - Choose whole grains, foods high in fiber and minimally processed items  - Typical grain sources include wheat, oats, barley, corn, brown rice, bulgar, farro, millet, polenta, couscous  - Various types of beans include chickpeas, lentils, fava beans, black beans, white beans   Fruits  Veggies - Large quantities of antioxidant rich fruits & veggies; 6 or more servings  - Vegetables can be eaten raw or lightly drizzled with oil and cooked  - Vegetables common to the traditional Mediterranean Diet include: artichokes, arugula, beets, broccoli, brussel sprouts, cabbage, carrots, celery, collard greens, cucumbers, eggplant, kale, leeks, lemons, lettuce, mushrooms, okra, onions, peas, peppers, potatoes, pumpkin, radishes, rutabaga, shallots, spinach, sweet potatoes, turnips, zucchini - Fruits common to the Mediterranean Diet include: apples, apricots, avocados, cherries, clementines, dates, figs, grapefruits, grapes, melons, nectarines, oranges, peaches, pears, pomegranates, strawberries, tangerines  Fats - Replace butter and margarine with healthy oils, such as olive oil, canola oil, and tahini  - Limit nuts to no more than a handful a day  - Nuts include walnuts, almonds, pecans, pistachios, pine nuts  - Limit or avoid candied, honey roasted or heavily  salted nuts - Olives are central to the Mediterranean diet - can be eaten whole or used in a variety of dishes   Meats Protein - Limiting red meat: no more than a few times a month - When eating red meat: choose lean cuts and keep the portion to the size of deck of cards -  Eggs: approx. 0 to 4 times a week  - Fish and lean poultry: at least 2 a week  - Healthy protein sources include, chicken, [place], lean beef, lamb - Increase intake of seafood such as tuna, salmon, trout, mackerel, shrimp, scallops - Avoid or limit high fat processed meats such as sausage and bacon  Dairy - Include moderate amounts of low fat dairy products  - Focus on healthy dairy such as fat free yogurt, skim milk, low or reduced fat cheese - Limit dairy products higher in fat such as whole or 2% milk, cheese, ice cream  Alcohol - Moderate amounts of red wine is ok  - No more than 5 oz daily for women (all ages) and men older than age 68  - No more than 10 oz of wine daily for men younger than 41  Other - Limit sweets and other desserts  - Use herbs and spices instead of salt to flavor foods  - Herbs and spices common to the traditional Mediterranean Diet include: basil, bay leaves, chives, cloves, cumin, fennel, garlic, lavender, marjoram, mint, oregano, parsley, pepper, rosemary, sage, savory, sumac, tarragon, thyme   It's not just a diet, it's a lifestyle:  . The Mediterranean diet includes lifestyle factors typical of those in the region  . Foods, drinks and meals are best eaten with others and savored . Daily physical activity is important for overall good health . This could be strenuous exercise like running and aerobics . This could also be more leisurely activities such as walking, housework, yard-work, or taking the stairs . Moderation is the key; a balanced and healthy diet accommodates most foods and drinks . Consider portion sizes and frequency of consumption of certain foods   Meal Ideas & Options:  . Breakfast:  o Whole wheat toast or whole wheat English muffins with peanut butter & hard boiled egg o Steel cut oats topped with apples & cinnamon and skim milk  o Fresh fruit: banana, strawberries, melon, berries, peaches  o Smoothies: strawberries, bananas, greek yogurt,  peanut butter o Low fat greek yogurt with blueberries and granola  o Egg white omelet with spinach and mushrooms o Breakfast couscous: whole wheat couscous, apricots, skim milk, cranberries  . Sandwiches:  o Hummus and grilled vegetables (peppers, zucchini, squash) on whole wheat bread   o Grilled chicken on whole wheat pita with lettuce, tomatoes, cucumbers or tzatziki  o Tuna salad on whole wheat bread: tuna salad made with greek yogurt, olives, red peppers, capers, green onions o Garlic rosemary lamb pita: lamb sauted with garlic, rosemary, salt & pepper; add lettuce, cucumber, greek yogurt to pita - flavor with lemon juice and black pepper  . Seafood:  o Mediterranean grilled salmon, seasoned with garlic, basil, parsley, lemon juice and black pepper o Shrimp, lemon, and spinach whole-grain pasta salad made with low fat greek yogurt  o Seared scallops with lemon orzo  o Seared tuna steaks seasoned salt, pepper, coriander topped with tomato mixture of olives, tomatoes, olive oil, minced garlic, parsley, green onions and cappers  . Meats:  o Herbed greek chicken salad with kalamata olives, cucumber, feta  o Red  bell peppers stuffed with spinach, bulgur, lean ground beef (or lentils) & topped with feta   o Kebabs: skewers of chicken, tomatoes, onions, zucchini, squash  o [place] burgers: made with red onions, mint, dill, lemon juice, feta cheese topped with roasted red peppers . Vegetarian o Cucumber salad: cucumbers, artichoke hearts, celery, red onion, feta cheese, tossed in olive oil & lemon juice  o Hummus and whole grain pita points with a greek salad (lettuce, tomato, feta, olives, cucumbers, red onion) o Lentil soup with celery, carrots made with vegetable broth, garlic, salt and pepper  o Tabouli salad: parsley, bulgur, mint, scallions, cucumbers, tomato, radishes, lemon juice, olive oil, salt and pepper.

## 2017-12-30 ENCOUNTER — Other Ambulatory Visit: Payer: Self-pay | Admitting: Cardiology

## 2017-12-30 ENCOUNTER — Other Ambulatory Visit: Payer: Self-pay | Admitting: Family Medicine

## 2018-01-09 ENCOUNTER — Other Ambulatory Visit: Payer: Self-pay | Admitting: Family Medicine

## 2018-01-09 DIAGNOSIS — I5032 Chronic diastolic (congestive) heart failure: Secondary | ICD-10-CM

## 2018-01-14 ENCOUNTER — Other Ambulatory Visit: Payer: Self-pay | Admitting: Cardiology

## 2018-01-17 DIAGNOSIS — H2513 Age-related nuclear cataract, bilateral: Secondary | ICD-10-CM | POA: Diagnosis not present

## 2018-01-17 DIAGNOSIS — H401133 Primary open-angle glaucoma, bilateral, severe stage: Secondary | ICD-10-CM | POA: Diagnosis not present

## 2018-01-17 DIAGNOSIS — H524 Presbyopia: Secondary | ICD-10-CM | POA: Diagnosis not present

## 2018-01-17 DIAGNOSIS — H04123 Dry eye syndrome of bilateral lacrimal glands: Secondary | ICD-10-CM | POA: Diagnosis not present

## 2018-01-24 ENCOUNTER — Other Ambulatory Visit: Payer: Self-pay | Admitting: Cardiology

## 2018-02-08 ENCOUNTER — Encounter: Payer: Self-pay | Admitting: Family Medicine

## 2018-02-08 ENCOUNTER — Ambulatory Visit (INDEPENDENT_AMBULATORY_CARE_PROVIDER_SITE_OTHER): Payer: Medicare Other | Admitting: Family Medicine

## 2018-02-08 VITALS — BP 120/82 | HR 69 | Temp 97.4°F | Ht 59.0 in | Wt 130.6 lb

## 2018-02-08 DIAGNOSIS — Z719 Counseling, unspecified: Secondary | ICD-10-CM | POA: Diagnosis not present

## 2018-02-08 DIAGNOSIS — Z79899 Other long term (current) drug therapy: Secondary | ICD-10-CM

## 2018-02-08 DIAGNOSIS — I4819 Other persistent atrial fibrillation: Secondary | ICD-10-CM

## 2018-02-08 DIAGNOSIS — B9789 Other viral agents as the cause of diseases classified elsewhere: Secondary | ICD-10-CM

## 2018-02-08 DIAGNOSIS — J069 Acute upper respiratory infection, unspecified: Secondary | ICD-10-CM

## 2018-02-08 DIAGNOSIS — I5032 Chronic diastolic (congestive) heart failure: Secondary | ICD-10-CM

## 2018-02-08 DIAGNOSIS — N183 Chronic kidney disease, stage 3 unspecified: Secondary | ICD-10-CM

## 2018-02-08 DIAGNOSIS — I6389 Other cerebral infarction: Secondary | ICD-10-CM | POA: Diagnosis not present

## 2018-02-08 MED ORDER — HYDROCOD POLST-CPM POLST ER 10-8 MG/5ML PO SUER: 5.0000 mL | Freq: Two times a day (BID) | ORAL | 0 refills | Status: DC | PRN

## 2018-02-08 MED ORDER — FLUTICASONE PROPIONATE 50 MCG/ACT NA SUSP: NASAL | 2 refills | Status: DC

## 2018-02-08 MED ORDER — AMOXICILLIN-POT CLAVULANATE 875-125 MG PO TABS: 1.0000 | ORAL_TABLET | Freq: Two times a day (BID) | ORAL | 0 refills | Status: DC

## 2018-02-08 NOTE — Patient Instructions (Signed)
  Symptoms for a upper respiratory tract infection usually last 3-7 days but can stretch out to 2-3 weeks before you're feeling back to normal.  Your symptoms should not worsen after 7-10 days and if they do, please notify our office, as you may need additional evaluation/ antibiotics.   You can use over-the-counter afrin nasal spray for up to 3 days (NO longer than that) which will help acutely with nasal drainage/ congestion short term.   Also, sterile saline nasal rinses, such as Milta Deiters med or AYR sinus rinses, can be very helpful and should be done twice daily- especially throughout the allergy season.   Remember you should use distilled water or previously boiled water to do this.  Then you may use over-the-counter Flonase 1 spray each nostril twice daily after sinus rinses.   You can also use an over the counter cold and flu medication such as Tylenol Severe Cold and Sinus/Flu or Dayquil, Nyquil and the like, which will help with cough, congestion, headache/ pain, fevers/chills etc.  Please note, if you being treated for hypertension or have high blood pressure, you should be using the cold meds designated "HBP".    Wash your hands frequently, as you did not want to get those around you sick as well. Never sneeze or cough on others.  And you should not be going to school or work if you are running a temperature of 100.5 or more on two separate occasions.   Drink plenty of fluids and stay hydrated, especially if you are running fevers.  We don't know why, but chicken soup also helps, try it! :)

## 2018-02-08 NOTE — Progress Notes (Signed)
Acute Care Office visit  Assessment and plan:  1. Viral URI with cough   2. Chronic diastolic heart failure ([place])   3. CKD (chronic kidney disease), stage III (HCC)   4. Persistent atrial fibrillation   5. High risk medication use   6. Health education/counseling     Viral URI with cough:    - Viral vs Allergic vs Bacterial causes for pt's symptoms reveiwed.    - Supportive care and various OTC medications discussed in addition to any prescribed. - Call or RTC if new symptoms, or if no improvement or worse over next several days.   - Will consider ABX if sx continue past 10 days and worsening if not already given.   -Informed the patient that I doubt that this is an exacerbation of her CHF. However, I advised the patient to decrease salt intake, weight yourself daily, and monitor for leg swelling.   Follow up PRN or as previously scheduled.    Meds ordered this encounter  Medications  . amoxicillin-clavulanate (AUGMENTIN) 875-125 MG tablet    Sig: Take 1 tablet by mouth 2 (two) times daily.    Dispense:  20 tablet    Refill:  0  . chlorpheniramine-HYDROcodone (TUSSIONEX) 10-8 MG/5ML SUER    Sig: Take 5 mLs by mouth every 12 (twelve) hours as needed for cough (cough, will cause drowsiness.).    Dispense:  115 mL    Refill:  0  . fluticasone (FLONASE) 50 MCG/ACT nasal spray    Sig: 1 spray each nostril following sinus rinses twice daily    Dispense:  16 g    Refill:  2   Note:  This document was prepared occasionally using Dragon voice recognition software and may include unintentional dictation errors in addition to a scribe.  This document serves as a record of services personally performed by Mellody Dance, DO. It was created on her behalf by Steva Colder, a trained medical scribe. The creation of this record is based on the scribe's personal observations and the provider's statements to them.   I have reviewed the above medical documentation for accuracy and  completeness and I concur.  Mellody Dance, DO      Subjective:    CC:  Chief Complaint  Patient presents with  . Cough     HPI: Patricia [name] is a 77 y.o. female who presents to [place] [facility] today for issues as discussed below.  She is complaining of cough onset less than 48 hours ago. She is having associated symptoms of sore throat, chest heaviness, post-nasal drip, SOB attributed to her current cold, eye redness, and mild left sided facial pain. She has not tried any medications for the relief of her symptoms. She denies fever, leg swelling, weight gain, and any other symptoms.     Patient Care Team    Relationship Specialty Notifications Start End  Mellody Dance, DO PCP - General Family Medicine  09/08/16   Thea Silversmith, MD Consulting Physician Radiation Oncology  11/29/14   Magrinat, Virgie Dad, MD Consulting Physician Oncology  11/29/14   Stark Klein, MD Consulting Physician General Surgery  11/29/14   Sylvan Cheese, NP Nurse Practitioner Nurse Practitioner  11/29/14    Comment: Survivorship  Ladene Artist, MD Consulting Physician Gastroenterology  02/14/15   Sueanne Margarita, MD Consulting Physician Cardiology  09/08/16   Marygrace Drought, MD Consulting Physician Ophthalmology  01/06/17    Comment: Sees him  for glaucoma    Past medical history, Surgical history, Family history reviewed and noted below, Social history, Allergies, and Medications have been entered into the medical record, reviewed and changed as needed.   Allergies  Allergen Reactions  . Combigan [Brimonidine Tartrate-Timolol] Itching    Eyes itch, reddened  . Other Other (See Comments)    Per patient made OU red, Sore, and sensitivity to light  . Sulfa Antibiotics Rash  . Sulfamethoxazole-Trimethoprim Hives    Review of Systems: - see above HPI for pertinent positives General:   No F/C, wt loss Pulm:   No DIB, pleuritic chest pain Card:  No CP,  palpitations Abd:  No n/v/d or pain Ext:  No inc edema from baseline   Objective:   Blood pressure 120/82, pulse 69, temperature (!) 97.4 F (36.3 C), height 4\' 11"  (1.499 m), weight 130 lb 9.6 oz (59.2 kg), SpO2 97 %. Body mass index is 26.38 kg/m.    General: Well Developed, well nourished, appropriate for stated age.  Neuro: Alert and oriented x3, extra-ocular muscles intact, sensation grossly intact.  HEENT: Normocephalic, atraumatic, pupils equal round reactive to light, neck supple, no masses, no painful lymphadenopathy, TM's intact B/L, no acute findings. Nares- patent, clear d/c, OP- clear, mild erythema, No TTP sinuses Skin: Warm and dry, no gross rash. Cardiac: RRR, S1 S2,  no murmurs rubs or gallops.  Respiratory: ECTA B/L and A/P, Not using accessory muscles, speaking in full sentences- unlabored. Vascular:  No gross lower ext edema, cap RF less 2 sec. Psych: No HI/SI, judgement and insight good, Euthymic mood. Full Affect.

## 2018-02-22 ENCOUNTER — Other Ambulatory Visit: Payer: Self-pay | Admitting: Family Medicine

## 2018-02-22 DIAGNOSIS — E032 Hypothyroidism due to medicaments and other exogenous substances: Secondary | ICD-10-CM

## 2018-03-26 ENCOUNTER — Other Ambulatory Visit: Payer: Self-pay | Admitting: Family Medicine

## 2018-03-26 DIAGNOSIS — E559 Vitamin D deficiency, unspecified: Secondary | ICD-10-CM

## 2018-04-18 ENCOUNTER — Ambulatory Visit (INDEPENDENT_AMBULATORY_CARE_PROVIDER_SITE_OTHER): Payer: Medicare Other | Admitting: Family Medicine

## 2018-04-18 ENCOUNTER — Encounter: Payer: Self-pay | Admitting: Family Medicine

## 2018-04-18 VITALS — BP 122/82 | HR 82 | Temp 97.8°F | Ht 59.0 in | Wt 134.3 lb

## 2018-04-18 DIAGNOSIS — N183 Chronic kidney disease, stage 3 unspecified: Secondary | ICD-10-CM

## 2018-04-18 DIAGNOSIS — I5032 Chronic diastolic (congestive) heart failure: Secondary | ICD-10-CM

## 2018-04-18 DIAGNOSIS — R7303 Prediabetes: Secondary | ICD-10-CM | POA: Diagnosis not present

## 2018-04-18 DIAGNOSIS — I10 Essential (primary) hypertension: Secondary | ICD-10-CM | POA: Diagnosis not present

## 2018-04-18 DIAGNOSIS — E785 Hyperlipidemia, unspecified: Secondary | ICD-10-CM

## 2018-04-18 DIAGNOSIS — I4819 Other persistent atrial fibrillation: Secondary | ICD-10-CM | POA: Diagnosis not present

## 2018-04-18 DIAGNOSIS — Z79899 Other long term (current) drug therapy: Secondary | ICD-10-CM | POA: Diagnosis not present

## 2018-04-18 LAB — POCT GLYCOSYLATED HEMOGLOBIN (HGB A1C): Hemoglobin A1C: 6 % — AB (ref 4.0–5.6)

## 2018-04-18 NOTE — Progress Notes (Signed)
Impression and Recommendations:    1. Prediabetes   2. Chronic diastolic heart failure (HCC)   3. Persistent atrial fibrillation   4. CKD (chronic kidney disease), stage III ([place])   5. High risk medication use   6. Essential hypertension   7. Hyperlipidemia, unspecified hyperlipidemia type     - Patient knows to take her med list and compare it to what she is actually taking at home.  1. Prediabetes - A1c last check was 6.0.  A1c is 6.0 today, stable.  - Counseled patient on prevention of disease and discussed prudent dietary and lifestyle modifications as first line.  Importance of low carb/ketogenic diet discussed with patient in addition to regular exercise.   - Will continue to monitor.  2. Eye Redness - Erythematous infiltrate of conjunctiva bilaterally.  No exudate, pus, etc.  Per patient, feels they are red and dry, but not itchy.  - Recommended Naphcon-A or Refresh eye drops OTC for relief PRN. - Will continue to monitor.  3. Chronic Diastolic Heart Failure, Persistent Atrial Fibrillation - Treatment plan managed by cardiology. - Continue on medications as prescribed. - Patient knows to continue to follow up with cardiology as established.  - Will continue to monitor.  4. Essential Hypertension - Stable at this time. - Continue treatment plan as established. - Will continue to monitor.  5. CKD Stage III - Reviewed critical need to control blood pressure and blood sugar.  - To help improve kidney and overall organ health, avoid nephrotoxic substances, increase hydration, and engage in regular physical activity to improve blood flow to all areas of the body.  - Strongly emphasized importance of adequate intake daily of water.  6. Lifestyle & Preventative Health Maintenance - Advised patient to continue working toward daily exercising to improve overall mental, physical, and emotional health.    - American Heart Association guidelines for healthy diet,  basically Mediterranean diet, and exercise guidelines of 30 minutes 5 days per week or more discussed in detail.  - Health counseling performed.  All questions answered.  - Encouraged patient to engage in daily physical activity, especially a formal exercise routine.  Recommended that the patient eventually strive for at least 150 minutes of moderate cardiovascular activity per week according to guidelines established by the [facility].   - Healthy dietary habits encouraged, including low-carb, and high amounts of lean protein in diet.   - Patient should also consume adequate amounts of water.   Orders Placed This Encounter  Procedures  . POCT glycosylated hemoglobin (Hb A1C)    Medications Discontinued During This Encounter  Medication Reason  . amoxicillin-clavulanate (AUGMENTIN) 875-125 MG tablet Completed Course  . chlorpheniramine-HYDROcodone (TUSSIONEX) 10-8 MG/5ML SUER Completed Course     Gross side effects, risk and benefits, and alternatives of medications and treatment plan in general discussed with patient.  Patient is aware that all medications have potential side effects and we are unable to predict every side effect or drug-drug interaction that may occur.   Patient will call with any questions prior to using medication if they have concerns.    Expresses verbal understanding and consents to current therapy and treatment regimen.  No barriers to understanding were identified.  Red flag symptoms and signs discussed in detail.  Patient expressed understanding regarding what to do in case of emergency\urgent symptoms  Please see AVS handed out to patient at the end of our visit for further patient instructions/ counseling done pertaining to today's office visit.  Return for pt to be seen in 4mo for chornic care or sooner if concerns.     Note:  This note was prepared with assistance of Dragon voice recognition software. Occasional wrong-word or sound-a-like substitutions may  have occurred due to the inherent limitations of voice recognition software.   This document serves as a record of services personally performed by Mellody Dance, DO. It was created on her behalf by [name], a trained medical scribe. The creation of this record is based on the scribe's personal observations and the provider's statements to them.   I have reviewed the above medical documentation for accuracy and completeness and I concur.  Mellody Dance, DO 04/19/2018 9:43 AM      ---------------------------------------------------------------------------------------------------------------------------------    Subjective:     HPI: Jaiyanna Safran is a 78 y.o. female who presents to [place] [facility] today for issues as discussed below.  Eye Dryness & Redness Per patient, her eyes are just dry.  Feeling good, feeling fine.    Sleep Sleeping well, about 8 hours every night.  Hydration Feels she is not drinking enough water.  1. HTN HPI:  -  Her blood pressure has been controlled at home.  Pt is checking it at home.   Her blood pressure runs around 120/70-80 at home.  Continues playing golf, walking the golf course.  Does 9 about three times per week, around 2-3 miles.  - Patient reports good compliance with treatment plan.  - Denies medication S-E   - Smoking Status noted   - She denies new onset of: chest pain, exercise intolerance, shortness of breath, dizziness, visual changes, headache, lower extremity swelling or claudication.   Last 3 blood pressure readings in our office are as follows: BP Readings from Last 3 Encounters:  04/18/18 122/82  02/08/18 120/82  12/14/17 109/76    Filed Weights   04/18/18 0940  Weight: 134 lb 4.8 oz (60.9 kg)     Wt Readings from Last 3 Encounters:  04/18/18 134 lb 4.8 oz (60.9 kg)  02/08/18 130 lb 9.6 oz (59.2 kg)  12/14/17 131 lb 9.6 oz (59.7 kg)   BP Readings from Last 3 Encounters:   04/18/18 122/82  02/08/18 120/82  12/14/17 109/76   Pulse Readings from Last 3 Encounters:  04/18/18 82  02/08/18 69  12/14/17 76   BMI Readings from Last 3 Encounters:  04/18/18 27.13 kg/m  02/08/18 26.38 kg/m  12/14/17 26.58 kg/m     Patient Care Team    Relationship Specialty Notifications Start End  Mellody Dance, DO PCP - General Family Medicine  09/08/16   Thea Silversmith, [place] Physician Radiation Oncology  11/29/14   Magrinat, Virgie Dad, MD Consulting Physician Oncology  11/29/14   Stark Klein, MD Consulting Physician General Surgery  11/29/14   Sylvan Cheese, NP Nurse Practitioner Nurse Practitioner  11/29/14    Comment: Survivorship  Ladene Artist, MD Consulting Physician Gastroenterology  02/14/15   Sueanne Margarita, MD Consulting Physician Cardiology  09/08/16   Marygrace Drought, MD Consulting Physician Ophthalmology  01/06/17    Comment: Sees him for glaucoma     Patient Active Problem List   Diagnosis Date Noted  . Prediabetes 01/06/2017    Priority: High  . CKD (chronic kidney disease), stage III ([place]) 10/30/2016    Priority: High  . Persistent atrial fibrillation     Priority: High  . Chronic diastolic heart failure ([place]) 06/17/2015    Priority: High  .  Essential hypertension 06/17/2015    Priority: High  . CVA (cerebral vascular accident) ([place]) 04/30/2015    Priority: High  . Hyperlipidemia 06/10/2009    Priority: High  . Lung cancer, lingula ([place]) 09/08/2016    Priority: Medium  . 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 Malignant neoplasm of upper-outer quadrant of left breast in female, estrogen receptor positive ([place]) 06/08/2013    Priority: Medium  . Multinodular goiter 01/11/2013    Priority: Medium  . 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 ESOPHAGEAL STRICTURE 09/26/2009    Priority: Medium  . GERD 09/26/2009    Priority: Medium  . Cough variant asthma 06/27/2007    Priority:  Medium  . Chronic cough 01/21/2007    Priority: Medium  . LUNG CANCER, HX OF 07/29/2006    Priority: Medium  . Arthritis 01/27/2017    Priority: Low  . Vitamin D deficiency 01/06/2017    Priority: Low  . Postmenopausal bone loss 01/06/2017    Priority: Low  . Osteopenia 05/28/2007    Priority: Low  . Cerebrovascular accident ([place]) 09/27/2017  . Memory changes 09/27/2017  . Hypocalcemia 05/27/2017  . h/o Radiation 04/14/2017  . High risk medication use 04/14/2017  . History of CVA (cerebrovascular accident) 05/14/2016  . Abnormal PFT 10/24/2015  . Aortic regurgitation 06/17/2015  . CVA (cerebral infarction)   . Pulmonary edema 04/27/2015  . General medical examination 06/09/2010  . Screening for malignant neoplasm of the cervix 06/09/2010  . HEARING LOSS, UNSPEC. 08/15/2009  . Diverticulosis of large intestine 06/10/2009  . GLAUCOMA, BILATERAL 02/07/2009  . STRICTURE AND STENOSIS OF CERVIX 05/06/2007  . POSTMENOPAUSAL STATUS 05/06/2007    Past Medical history, Surgical history, Family history, Social history, Allergies and Medications have been entered into the medical record, reviewed and changed as needed.    Current Meds  Medication Sig  . albuterol (PROVENTIL HFA;VENTOLIN HFA) 108 (90 Base) MCG/ACT inhaler Inhale 2 puffs into the lungs every 6 (six) hours as needed for wheezing or shortness of breath.  . anastrozole (ARIMIDEX) 1 MG tablet Take 1 tablet (1 mg total) by mouth daily.  Francia Greaves THYROID 60 MG tablet TAKE 1 TABLET DAILY BEFORE BREAKFAST  . atorvastatin (LIPITOR) 40 MG tablet TAKE 1 TABLET DAILY  . calcium-vitamin D (OSCAL WITH D) 500-200 MG-UNIT tablet Take 1 tablet by mouth daily with breakfast.  . digoxin (LANOXIN) 0.125 MG tablet TAKE 1 TABLET DAILY  . diltiazem (CARDIZEM CD) 180 MG 24 hr capsule TAKE AS INSTRUCTED BY YOUR PRESCRIBER (NEED APPOINTMENT)  . ELIQUIS 5 MG TABS tablet TAKE 1 TABLET TWICE A DAY  . fluticasone (FLONASE) 50 MCG/ACT nasal spray 1  spray each nostril following sinus rinses twice daily  . fluticasone-salmeterol (ADVAIR HFA) 230-21 MCG/ACT inhaler USE 2 INHALATIONS TWICE A DAY  . furosemide (LASIX) 20 MG tablet TAKE 2 TABLETS EVERY MORNING AND 1 TABLET EVERY EVENING  . gabapentin (NEURONTIN) 400 MG capsule TAKE 1 CAPSULE TWICE A DAY  . loratadine (CLARITIN) 10 MG tablet Take 10 mg by mouth daily as needed for allergies. Reported on 09/16/2015  . Multiple Vitamins-Iron (MULTIVITAMIN/IRON) TABS Take 1 tablet by mouth daily.   Marland Kitchen omeprazole (PRILOSEC) 20 MG capsule TAKE 1 CAPSULE TWICE A DAY 30 MINUTES BEFORE MEALS (BEFORE LUNCH AND DINNER)  . potassium chloride SA (K-DUR,KLOR-CON) 20 MEQ tablet Take 1 tablet by mouth twice a day  . Vitamin D, Ergocalciferol, (DRISDOL) 1.25 MG (50000 UT) CAPS capsule  TAKE 1 CAPSULE EVERY WEDNESDAY AND SUNDAY    Allergies:  Allergies  Allergen Reactions  . Combigan [Brimonidine Tartrate-Timolol] Itching    Eyes itch, reddened  . Other Other (See Comments)    Per patient made OU red, Sore, and sensitivity to light  . Sulfa Antibiotics Rash  . Sulfamethoxazole-Trimethoprim Hives     Review of Systems:  A fourteen system review of systems was performed and found to be positive as per HPI.   Objective:   Blood pressure 122/82, pulse 82, temperature 97.8 F (36.6 C), height 4\' 11"  (1.499 m), weight 134 lb 4.8 oz (60.9 kg), SpO2 97 %. Body mass index is 27.13 kg/m. General:  Well Developed, well nourished, appropriate for stated age.  Neuro:  Alert and oriented,  extra-ocular muscles intact  HEENT:  Normocephalic, atraumatic, neck supple, no carotid bruits appreciated  Eyes: Erythematous infiltrate of conjunctiva bilaterally.  No exudate, pus, etc.  Patient feels they are red but not itchy. Skin:  no gross rash, warm, pink. Cardiac:  Irregularly irregular, S1 S2 Respiratory:  ECTA B/L and A/P, Not using accessory muscles, speaking in full sentences- unlabored. Vascular:  Ext warm, no  cyanosis apprec.; cap RF less 2 sec. Psych:  No HI/SI, judgement and insight good, Euthymic mood. Full Affect.

## 2018-04-18 NOTE — Patient Instructions (Addendum)
Naphcon A for eyes if itchy otherwise if only red- take any OTC visine -type solution for dry eyes   Please realize, EXERCISE IS MEDICINE!  -  American Heart Association [facility]) guidelines for exercise : If you are in good health, without any medical conditions, you should engage in 150-300 minutes of moderate intensity aerobic activity per week.  This means you should be huffing and puffing throughout your workout.   Engaging in regular exercise will improve brain function and memory, as well as improve mood, boost immune system and help with weight management.  As well as the other, more well-known effects of exercise such as decreasing blood sugar levels, decreasing blood pressure,  and decreasing bad cholesterol levels/ increasing good cholesterol levels.     -  The AHA strongly endorses consumption of a diet that contains a variety of foods from all the food categories with an emphasis on fruits and vegetables; fat-free and low-fat dairy products; cereal and grain products; legumes and nuts; and fish, poultry, and/or extra lean meats.    Excessive food intake, especially of foods high in saturated and trans fats, sugar, and salt, should be avoided.    Adequate water intake of roughly 1/2 of your weight in pounds, should equal the ounces of water per day you should drink.  So for instance, if you're 200 pounds, that would be 100 ounces of water per day.         Mediterranean Diet  Why follow it? Research shows. . Those who follow the Mediterranean diet have a reduced risk of heart disease  . The diet is associated with a reduced incidence of Parkinson's and Alzheimer's diseases . People following the diet may have longer life expectancies and lower rates of chronic diseases  . The Dietary Guidelines for Americans recommends the Mediterranean diet as an eating plan to promote health and prevent disease  What Is the Mediterranean Diet?  . Healthy eating plan based on typical foods and recipes  of Mediterranean-style cooking . The diet is primarily a plant based diet; these foods should make up a majority of meals   Starches - Plant based foods should make up a majority of meals - They are an important sources of vitamins, minerals, energy, antioxidants, and fiber - Choose whole grains, foods high in fiber and minimally processed items  - Typical grain sources include wheat, oats, barley, corn, brown rice, bulgar, farro, millet, polenta, couscous  - Various types of beans include chickpeas, lentils, fava beans, black beans, white beans   Fruits  Veggies - Large quantities of antioxidant rich fruits & veggies; 6 or more servings  - Vegetables can be eaten raw or lightly drizzled with oil and cooked  - Vegetables common to the traditional Mediterranean Diet include: artichokes, arugula, beets, broccoli, brussel sprouts, cabbage, carrots, celery, collard greens, cucumbers, eggplant, kale, leeks, lemons, lettuce, mushrooms, okra, onions, peas, peppers, potatoes, pumpkin, radishes, rutabaga, shallots, spinach, sweet potatoes, turnips, zucchini - Fruits common to the Mediterranean Diet include: apples, apricots, avocados, cherries, clementines, dates, figs, grapefruits, grapes, melons, nectarines, oranges, peaches, pears, pomegranates, strawberries, tangerines  Fats - Replace butter and margarine with healthy oils, such as olive oil, canola oil, and tahini  - Limit nuts to no more than a handful a day  - Nuts include walnuts, almonds, pecans, pistachios, pine nuts  - Limit or avoid candied, honey roasted or heavily salted nuts - Olives are central to the Mediterranean diet - can be eaten whole or used  in a variety of dishes   Meats Protein - Limiting red meat: no more than a few times a month - When eating red meat: choose lean cuts and keep the portion to the size of deck of cards - Eggs: approx. 0 to 4 times a week  - Fish and lean poultry: at least 2 a week  - Healthy protein sources  include, chicken, [place], lean beef, lamb - Increase intake of seafood such as tuna, salmon, trout, mackerel, shrimp, scallops - Avoid or limit high fat processed meats such as sausage and bacon  Dairy - Include moderate amounts of low fat dairy products  - Focus on healthy dairy such as fat free yogurt, skim milk, low or reduced fat cheese - Limit dairy products higher in fat such as whole or 2% milk, cheese, ice cream  Alcohol - Moderate amounts of red wine is ok  - No more than 5 oz daily for women (all ages) and men older than age 26  - No more than 10 oz of wine daily for men younger than 28  Other - Limit sweets and other desserts  - Use herbs and spices instead of salt to flavor foods  - Herbs and spices common to the traditional Mediterranean Diet include: basil, bay leaves, chives, cloves, cumin, fennel, garlic, lavender, marjoram, mint, oregano, parsley, pepper, rosemary, sage, savory, sumac, tarragon, thyme   It's not just a diet, it's a lifestyle:  . The Mediterranean diet includes lifestyle factors typical of those in the region  . Foods, drinks and meals are best eaten with others and savored . Daily physical activity is important for overall good health . This could be strenuous exercise like running and aerobics . This could also be more leisurely activities such as walking, housework, yard-work, or taking the stairs . Moderation is the key; a balanced and healthy diet accommodates most foods and drinks . Consider portion sizes and frequency of consumption of certain foods   Meal Ideas & Options:  . Breakfast:  o Whole wheat toast or whole wheat English muffins with peanut butter & hard boiled egg o Steel cut oats topped with apples & cinnamon and skim milk  o Fresh fruit: banana, strawberries, melon, berries, peaches  o Smoothies: strawberries, bananas, greek yogurt, peanut butter o Low fat greek yogurt with blueberries and granola  o Egg white omelet with spinach and  mushrooms o Breakfast couscous: whole wheat couscous, apricots, skim milk, cranberries  . Sandwiches:  o Hummus and grilled vegetables (peppers, zucchini, squash) on whole wheat bread   o Grilled chicken on whole wheat pita with lettuce, tomatoes, cucumbers or tzatziki  o Tuna salad on whole wheat bread: tuna salad made with greek yogurt, olives, red peppers, capers, green onions o Garlic rosemary lamb pita: lamb sauted with garlic, rosemary, salt & pepper; add lettuce, cucumber, greek yogurt to pita - flavor with lemon juice and black pepper  . Seafood:  o Mediterranean grilled salmon, seasoned with garlic, basil, parsley, lemon juice and black pepper o Shrimp, lemon, and spinach whole-grain pasta salad made with low fat greek yogurt  o Seared scallops with lemon orzo  o Seared tuna steaks seasoned salt, pepper, coriander topped with tomato mixture of olives, tomatoes, olive oil, minced garlic, parsley, green onions and cappers  . Meats:  o Herbed greek chicken salad with kalamata olives, cucumber, feta  o Red bell peppers stuffed with spinach, bulgur, lean ground beef (or lentils) & topped with feta  o Kebabs: skewers of chicken, tomatoes, onions, zucchini, squash  o [place] burgers: made with red onions, mint, dill, lemon juice, feta cheese topped with roasted red peppers . Vegetarian o Cucumber salad: cucumbers, artichoke hearts, celery, red onion, feta cheese, tossed in olive oil & lemon juice  o Hummus and whole grain pita points with a greek salad (lettuce, tomato, feta, olives, cucumbers, red onion) o Lentil soup with celery, carrots made with vegetable broth, garlic, salt and pepper  o Tabouli salad: parsley, bulgur, mint, scallions, cucumbers, tomato, radishes, lemon juice, olive oil, salt and pepper.

## 2018-04-19 ENCOUNTER — Telehealth: Payer: Self-pay | Admitting: Oncology

## 2018-04-19 NOTE — Telephone Encounter (Signed)
Called patient per scheduling voicemail log.  Rescheduled patient injection appt.  Patient aware of appt date and time.

## 2018-04-25 ENCOUNTER — Ambulatory Visit: Payer: Medicare Other

## 2018-04-29 ENCOUNTER — Telehealth: Payer: Self-pay | Admitting: Oncology

## 2018-04-29 NOTE — Telephone Encounter (Signed)
Scheduled appt per 2/27 sch message - pt is aware of appt date and time

## 2018-05-02 ENCOUNTER — Other Ambulatory Visit: Payer: Self-pay | Admitting: *Deleted

## 2018-05-02 ENCOUNTER — Inpatient Hospital Stay: Payer: Medicare Other | Attending: Oncology

## 2018-05-02 ENCOUNTER — Inpatient Hospital Stay: Payer: Medicare Other

## 2018-05-02 DIAGNOSIS — M8580 Other specified disorders of bone density and structure, unspecified site: Secondary | ICD-10-CM

## 2018-05-02 DIAGNOSIS — Z170 Estrogen receptor positive status [ER+]: Principal | ICD-10-CM

## 2018-05-02 DIAGNOSIS — C50412 Malignant neoplasm of upper-outer quadrant of left female breast: Secondary | ICD-10-CM | POA: Diagnosis not present

## 2018-05-02 DIAGNOSIS — Z79811 Long term (current) use of aromatase inhibitors: Secondary | ICD-10-CM | POA: Diagnosis not present

## 2018-05-02 LAB — CBC WITH DIFFERENTIAL (CANCER CENTER ONLY)
ABS IMMATURE GRANULOCYTES: 0.05 10*3/uL (ref 0.00–0.07)
BASOS ABS: 0 10*3/uL (ref 0.0–0.1)
BASOS PCT: 1 %
Eosinophils Absolute: 0 10*3/uL (ref 0.0–0.5)
Eosinophils Relative: 0 %
HCT: 43.6 % (ref 36.0–46.0)
Hemoglobin: 14.3 g/dL (ref 12.0–15.0)
IMMATURE GRANULOCYTES: 1 %
LYMPHS ABS: 1.5 10*3/uL (ref 0.7–4.0)
Lymphocytes Relative: 23 %
MCH: 31.4 pg (ref 26.0–34.0)
MCHC: 32.8 g/dL (ref 30.0–36.0)
MCV: 95.6 fL (ref 80.0–100.0)
MONOS PCT: 8 %
Monocytes Absolute: 0.5 10*3/uL (ref 0.1–1.0)
NEUTROS ABS: 4.5 10*3/uL (ref 1.7–7.7)
NEUTROS PCT: 67 %
NRBC: 0 % (ref 0.0–0.2)
Platelet Count: 264 10*3/uL (ref 150–400)
RBC: 4.56 MIL/uL (ref 3.87–5.11)
RDW: 12.9 % (ref 11.5–15.5)
WBC Count: 6.5 10*3/uL (ref 4.0–10.5)

## 2018-05-02 LAB — CMP (CANCER CENTER ONLY)
ALBUMIN: 3.5 g/dL (ref 3.5–5.0)
ALT: 40 U/L (ref 0–44)
AST: 27 U/L (ref 15–41)
Alkaline Phosphatase: 91 U/L (ref 38–126)
Anion gap: 11 (ref 5–15)
BUN: 15 mg/dL (ref 8–23)
CO2: 27 mmol/L (ref 22–32)
CREATININE: 0.97 mg/dL (ref 0.44–1.00)
Calcium: 9.5 mg/dL (ref 8.9–10.3)
Chloride: 104 mmol/L (ref 98–111)
GFR, EST NON AFRICAN AMERICAN: 56 mL/min — AB (ref >60)
GFR, Est AFR Am: >60 mL/min (ref >60)
GLUCOSE: 119 mg/dL — AB (ref 70–99)
Potassium: 4.2 mmol/L (ref 3.5–5.1)
Sodium: 142 mmol/L (ref 135–145)
Total Bilirubin: 1.1 mg/dL (ref 0.3–1.2)
Total Protein: 7 g/dL (ref 6.5–8.1)

## 2018-05-02 MED ORDER — DENOSUMAB 60 MG/ML ~~LOC~~ SOLN
60.0000 mg | Freq: Once | SUBCUTANEOUS | Status: AC
  Administered 2018-05-02: 60 mg via SUBCUTANEOUS

## 2018-05-02 MED ORDER — DENOSUMAB 60 MG/ML ~~LOC~~ SOSY: 60.0000 mg | PREFILLED_SYRINGE | Freq: Once | SUBCUTANEOUS | Status: DC

## 2018-05-02 MED ORDER — DENOSUMAB 60 MG/ML ~~LOC~~ SOSY
PREFILLED_SYRINGE | SUBCUTANEOUS | Status: AC
  Filled 2018-05-02: qty 1

## 2018-05-02 NOTE — Patient Instructions (Signed)

## 2018-05-03 ENCOUNTER — Other Ambulatory Visit: Payer: Self-pay | Admitting: Cardiology

## 2018-05-11 ENCOUNTER — Other Ambulatory Visit: Payer: Medicare Other

## 2018-05-19 ENCOUNTER — Other Ambulatory Visit: Payer: Self-pay | Admitting: Family Medicine

## 2018-05-19 ENCOUNTER — Other Ambulatory Visit: Payer: Self-pay | Admitting: Physician Assistant

## 2018-05-19 DIAGNOSIS — I5032 Chronic diastolic (congestive) heart failure: Secondary | ICD-10-CM

## 2018-05-19 DIAGNOSIS — I10 Essential (primary) hypertension: Secondary | ICD-10-CM

## 2018-05-19 DIAGNOSIS — I351 Nonrheumatic aortic (valve) insufficiency: Secondary | ICD-10-CM

## 2018-05-19 DIAGNOSIS — I4819 Other persistent atrial fibrillation: Secondary | ICD-10-CM

## 2018-05-23 DIAGNOSIS — H401133 Primary open-angle glaucoma, bilateral, severe stage: Secondary | ICD-10-CM | POA: Diagnosis not present

## 2018-07-09 ENCOUNTER — Other Ambulatory Visit: Payer: Self-pay | Admitting: Cardiology

## 2018-07-25 ENCOUNTER — Other Ambulatory Visit: Payer: Self-pay | Admitting: Physician Assistant

## 2018-08-02 ENCOUNTER — Other Ambulatory Visit: Payer: Self-pay | Admitting: Cardiology

## 2018-08-04 DIAGNOSIS — Z853 Personal history of malignant neoplasm of breast: Secondary | ICD-10-CM | POA: Diagnosis not present

## 2018-08-04 LAB — HM MAMMOGRAPHY

## 2018-08-14 ENCOUNTER — Other Ambulatory Visit: Payer: Self-pay | Admitting: Physician Assistant

## 2018-08-14 DIAGNOSIS — I5033 Acute on chronic diastolic (congestive) heart failure: Secondary | ICD-10-CM

## 2018-08-15 ENCOUNTER — Other Ambulatory Visit: Payer: Self-pay

## 2018-08-15 ENCOUNTER — Ambulatory Visit (INDEPENDENT_AMBULATORY_CARE_PROVIDER_SITE_OTHER): Payer: Medicare Other | Admitting: Family Medicine

## 2018-08-15 ENCOUNTER — Encounter: Payer: Self-pay | Admitting: Family Medicine

## 2018-08-15 VITALS — BP 113/75 | HR 87 | Temp 98.4°F | Ht 60.0 in | Wt 131.0 lb

## 2018-08-15 DIAGNOSIS — N183 Chronic kidney disease, stage 3 unspecified: Secondary | ICD-10-CM

## 2018-08-15 DIAGNOSIS — J385 Laryngeal spasm: Secondary | ICD-10-CM | POA: Diagnosis not present

## 2018-08-15 DIAGNOSIS — I10 Essential (primary) hypertension: Secondary | ICD-10-CM

## 2018-08-15 DIAGNOSIS — E559 Vitamin D deficiency, unspecified: Secondary | ICD-10-CM

## 2018-08-15 DIAGNOSIS — M810 Age-related osteoporosis without current pathological fracture: Secondary | ICD-10-CM | POA: Diagnosis not present

## 2018-08-15 DIAGNOSIS — E042 Nontoxic multinodular goiter: Secondary | ICD-10-CM | POA: Diagnosis not present

## 2018-08-15 DIAGNOSIS — I5032 Chronic diastolic (congestive) heart failure: Secondary | ICD-10-CM | POA: Diagnosis not present

## 2018-08-15 DIAGNOSIS — R7303 Prediabetes: Secondary | ICD-10-CM

## 2018-08-15 NOTE — Progress Notes (Signed)
Impression and Recommendations:    1. Laryngospasm   2. Prediabetes   3. Essential hypertension   4. CKD (chronic kidney disease), stage III ([place])   5. Chronic diastolic heart failure ([place])   6. Multinodular goiter   7. Vitamin D deficiency   8. Postmenopausal bone loss     Laryngospasm - Plan: Patient will continue the Neurontin that were giving her.  Tolerating well.  No side effects.  Prediabetes - Plan: A1c is checked every 6 months.  Has consistently been 6.0 for the past year.  Will recheck in 3 months.  Encourage patient to maintain weight, activity, and prudent diet to prevent onset of becoming diabetic  Essential hypertension - Plan: Encouraged patient to check blood pressure at home couple times a week and whenever she is symptomatic at all.  Followed by cardiology closely for this due to extensive cardiovascular history  CKD (chronic kidney disease), stage III ([place]) - Plan: Last CMP obtained by Dr. [name] about 3 months ago was perfect.  Serum creatinine less than 1.  GFR 56  Chronic diastolic heart failure ([place]) - Plan: Symptoms stable.  Continue treatment per cards  Multinodular goiter - Plan: We will give patient her thyroid medicines.  Symptoms stable.  TSH within normal limits will recheck yearly, or sooner if becomes symptomatic  Vitamin D deficiency - Plan: Told patient to cut back to 1 tablet weekly during the summer and take 2 tablets weekly during the winter.  We will recheck her levels in 3 months-once yearly.  Postmenopausal bone loss - Plan: Prolia infusions per Dr. [name].  Recently had a 3 months ago.  She understands importance of weightbearing exercises, vitamin D and calcium supplements.  Gross side effects, risk and benefits, and alternatives of medications and treatment plan in general discussed with patient.  Patient is aware that all medications have potential side effects and we are unable to predict every side effect or drug-drug  interaction that may occur.   Patient will call with any questions prior to using medication if they have concerns.    Expresses verbal understanding and consents to current therapy and treatment regimen.  No barriers to understanding were identified.  Red flag symptoms and signs discussed in detail.  Patient expressed understanding regarding what to do in case of emergency\urgent symptoms  Please see AVS handed out to patient at the end of our visit for further patient instructions/ counseling done pertaining to today's office visit.   Return Will follow-up as needed as well., for medicare yrly in 3 mo-telehealth. NEEDs full set FBW at that time as well- lab only sep appt.     Note:  This note was prepared with assistance of Dragon voice recognition software. Occasional wrong-word or sound-a-like substitutions may have occurred due to the inherent limitations of voice recognition software.  Mellody Dance, DO 08/15/2018 10:40 AM     ------------------------------------------------------------------------------------------------------------------------------------------------------------------------------------------------------------------------------    Subjective:     HPI: Patricia [name] is a 78 y.o. female who presents to [place] at [facility] today for issues as discussed below.   Chf/ A-fib:  Still very active.  Denies any heart palpitations, increase in weight, orthopnea or paroxysmal nocturnal dyspnea/ swelling in the legs etc.  Pre-dm:   a1c 3 mo ago- 6.0.  Patient is continuing to eat well, keep her weight consistent and has maintained her walking a full set of 18 holes 3 times weekly for her golf game.    Bp;  113/75, P-87, bp's well controlled at home whenever she checks.  Patient is completely asymptomatic and feeling well  laryngospasms-patient still taking her Neurontin twice daily.  Tolerating well no problems.  Osteoporosis.  Approximately 8 to now  3 months ago patient went in for her Prolia infusion with Dr. [name].  CMP and CBC at that time was normal.  Patient is taking her vitamin D as well as her calcium supplements.     Wt Readings from Last 3 Encounters:  08/15/18 131 lb (59.4 kg)  04/18/18 134 lb 4.8 oz (60.9 kg)  02/08/18 130 lb 9.6 oz (59.2 kg)   BP Readings from Last 3 Encounters:  08/15/18 113/75  04/18/18 122/82  02/08/18 120/82   Pulse Readings from Last 3 Encounters:  08/15/18 87  04/18/18 82  02/08/18 69   BMI Readings from Last 3 Encounters:  08/15/18 25.58 kg/m  04/18/18 27.13 kg/m  02/08/18 26.38 kg/m     Patient Care Team    Relationship Specialty Notifications Start End  Mellody Dance, DO PCP - General Family Medicine  09/08/16   Thea Silversmith, MD Consulting Physician Radiation Oncology  11/29/14   Magrinat, Virgie Dad, MD Consulting Physician Oncology  11/29/14   Stark Klein, MD Consulting Physician General Surgery  11/29/14   Sylvan Cheese, NP Nurse Practitioner Nurse Practitioner  11/29/14    Comment: Survivorship  Ladene Artist, MD Consulting Physician Gastroenterology  02/14/15   Sueanne Margarita, MD Consulting Physician Cardiology  09/08/16   Marygrace Drought, MD Consulting Physician Ophthalmology  01/06/17    Comment: Sees him for glaucoma     Patient Active Problem List   Diagnosis Date Noted  . Prediabetes 01/06/2017    Priority: High  . CKD (chronic kidney disease), stage III ([place]) 10/30/2016    Priority: High  . Persistent atrial fibrillation     Priority: High  . Chronic diastolic heart failure ([employer]) 06/17/2015    Priority: High  . Essential hypertension 06/17/2015    Priority: High  . CVA (cerebral vascular accident) ([place]) 04/30/2015    Priority: High  . Hyperlipidemia 06/10/2009    Priority: High  . Lung cancer, lingula ([place]) 09/08/2016    Priority: Medium  . 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 Malignant neoplasm of  upper-outer quadrant of left breast in female, estrogen receptor positive ([place]) 06/08/2013    Priority: Medium  . Multinodular goiter 01/11/2013    Priority: Medium  . 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 ESOPHAGEAL STRICTURE 09/26/2009    Priority: Medium  . GERD 09/26/2009    Priority: Medium  . Cough variant asthma 06/27/2007    Priority: Medium  . Chronic cough 01/21/2007    Priority: Medium  . LUNG CANCER, HX OF 07/29/2006    Priority: Medium  . Arthritis 01/27/2017    Priority: Low  . Vitamin D deficiency 01/06/2017    Priority: Low  . Postmenopausal bone loss 01/06/2017    Priority: Low  . Osteopenia 05/28/2007    Priority: Low  . Cerebrovascular accident ([place]) 09/27/2017  . Memory changes 09/27/2017  . Hypocalcemia 05/27/2017  . h/o Radiation 04/14/2017  . High risk medication use 04/14/2017  . History of CVA (cerebrovascular accident) 05/14/2016  . Abnormal PFT 10/24/2015  . Aortic regurgitation 06/17/2015  . CVA (cerebral infarction)   . Pulmonary edema 04/27/2015  . General medical examination 06/09/2010  . Screening for malignant neoplasm of the cervix 06/09/2010  .  HEARING LOSS, UNSPEC. 08/15/2009  . Diverticulosis of large intestine 06/10/2009  . GLAUCOMA, BILATERAL 02/07/2009  . STRICTURE AND STENOSIS OF CERVIX 05/06/2007  . POSTMENOPAUSAL STATUS 05/06/2007    Past Medical history, Surgical history, Family history, Social history, Allergies and Medications have been entered into the medical record, reviewed and changed as needed.    Current Meds  Medication Sig  . albuterol (PROVENTIL HFA;VENTOLIN HFA) 108 (90 Base) MCG/ACT inhaler Inhale 2 puffs into the lungs every 6 (six) hours as needed for wheezing or shortness of breath.  . anastrozole (ARIMIDEX) 1 MG tablet Take 1 tablet (1 mg total) by mouth daily.  Francia Greaves THYROID 60 MG tablet TAKE 1 TABLET DAILY BEFORE BREAKFAST  . atorvastatin (LIPITOR) 40 MG tablet TAKE 1 TABLET DAILY  .  calcium-vitamin D (OSCAL WITH D) 500-200 MG-UNIT tablet Take 1 tablet by mouth daily with breakfast.  . digoxin (LANOXIN) 0.125 MG tablet TAKE 1 TABLET DAILY (NEED TO CALL AND MAKE APPOINTMENT BY APRIL FOR FURTHER REFILLS)  . diltiazem (CARDIZEM CD) 180 MG 24 hr capsule Take 1 capsule (180 mg total) by mouth daily. Please schedule an appt 1st attempt  . ELIQUIS 5 MG TABS tablet TAKE 1 TABLET TWICE A DAY  . fluticasone (FLONASE) 50 MCG/ACT nasal spray 1 spray each nostril following sinus rinses twice daily  . fluticasone-salmeterol (ADVAIR HFA) 230-21 MCG/ACT inhaler USE 2 INHALATIONS TWICE A DAY  . furosemide (LASIX) 20 MG tablet TAKE 2 TABLETS EVERY MORNING AND 1 TABLET EVERY EVENING  . gabapentin (NEURONTIN) 400 MG capsule TAKE 1 CAPSULE TWICE A DAY  . loratadine (CLARITIN) 10 MG tablet Take 10 mg by mouth daily as needed for allergies. Reported on 09/16/2015  . Multiple Vitamins-Iron (MULTIVITAMIN/IRON) TABS Take 1 tablet by mouth daily.   Marland Kitchen omeprazole (PRILOSEC) 20 MG capsule TAKE 1 CAPSULE TWICE A DAY 30 MINUTES BEFORE MEALS (BEFORE LUNCH AND DINNER)  . potassium chloride SA (K-DUR,KLOR-CON) 20 MEQ tablet Take 1 tablet by mouth twice a day  . Vitamin D, Ergocalciferol, (DRISDOL) 1.25 MG (50000 UT) CAPS capsule TAKE 1 CAPSULE EVERY WEDNESDAY AND SUNDAY    Allergies:  Allergies  Allergen Reactions  . Combigan [Brimonidine Tartrate-Timolol] Itching    Eyes itch, reddened  . Other Other (See Comments)    Per patient made OU red, Sore, and sensitivity to light  . Sulfa Antibiotics Rash  . Sulfamethoxazole-Trimethoprim Hives     Review of Systems:  A fourteen system review of systems was performed and found to be positive as per HPI.   Objective:   Blood pressure 113/75, pulse 87, temperature 98.4 F (36.9 C), height 5' (1.524 m), weight 131 lb (59.4 kg). Body mass index is 25.58 kg/m. General:  Well Developed, well nourished, appropriate for stated age.  Neuro:  Alert and  oriented,  extra-ocular muscles intact  HEENT:  Normocephalic, atraumatic, neck supple, no carotid bruits appreciated  Skin:  no gross rash, warm, pink. Cardiac:  RRR, S1 S2 Respiratory:  ECTA B/L and A/P, Not using accessory muscles, speaking in full sentences- unlabored. Vascular:  Ext warm, no cyanosis apprec.; cap RF less 2 sec. Psych:  No HI/SI, judgement and insight good, Euthymic mood. Full Affect.

## 2018-09-01 ENCOUNTER — Other Ambulatory Visit: Payer: Self-pay | Admitting: Family Medicine

## 2018-09-01 DIAGNOSIS — I10 Essential (primary) hypertension: Secondary | ICD-10-CM

## 2018-09-01 DIAGNOSIS — I4819 Other persistent atrial fibrillation: Secondary | ICD-10-CM

## 2018-09-01 DIAGNOSIS — I351 Nonrheumatic aortic (valve) insufficiency: Secondary | ICD-10-CM

## 2018-09-01 DIAGNOSIS — I5032 Chronic diastolic (congestive) heart failure: Secondary | ICD-10-CM

## 2018-09-15 ENCOUNTER — Telehealth: Payer: Self-pay | Admitting: Oncology

## 2018-09-15 NOTE — Telephone Encounter (Signed)
[facility] 8/26 appointments moved from 8/26 to 8/31. Confirmed with patient.

## 2018-09-20 ENCOUNTER — Telehealth: Payer: Self-pay | Admitting: Family Medicine

## 2018-09-20 NOTE — Telephone Encounter (Signed)
Patient called states she is taking an OTC calcium suppliment which is different from one  PCP prescribed she request call back to discuss which one she should be taking.  Dr.O prescribed :  calcium-vitamin D (OSCAL WITH D) 500-200 MG-UNIT tablet [[id number]]   Order Details Dose: 1 tablet Route: Oral Frequency: Daily with breakfast  Dispense Quantity: 30 tablet Refills: 0 Fills remaining      ---- Forwarding message to medical assistant to call her at [phone].  --glh

## 2018-09-21 DIAGNOSIS — H401133 Primary open-angle glaucoma, bilateral, severe stage: Secondary | ICD-10-CM | POA: Diagnosis not present

## 2018-09-21 NOTE — Telephone Encounter (Signed)
Called patient is taking calcium 600 mg with Vitamin d.  Advised the patient that this is okay to continue. MPulliam, CMA/RT(R)

## 2018-10-15 DIAGNOSIS — Z23 Encounter for immunization: Secondary | ICD-10-CM | POA: Diagnosis not present

## 2018-10-24 ENCOUNTER — Ambulatory Visit: Payer: Medicare Other | Admitting: Oncology

## 2018-10-24 ENCOUNTER — Ambulatory Visit: Payer: Medicare Other

## 2018-10-24 ENCOUNTER — Other Ambulatory Visit: Payer: Medicare Other

## 2018-10-26 ENCOUNTER — Ambulatory Visit: Payer: Self-pay

## 2018-10-26 ENCOUNTER — Ambulatory Visit: Payer: Medicare Other

## 2018-10-26 ENCOUNTER — Other Ambulatory Visit: Payer: Medicare Other

## 2018-10-26 ENCOUNTER — Ambulatory Visit: Payer: Medicare Other | Admitting: Oncology

## 2018-10-27 ENCOUNTER — Other Ambulatory Visit: Payer: Self-pay | Admitting: Oncology

## 2018-10-28 ENCOUNTER — Other Ambulatory Visit: Payer: Self-pay | Admitting: *Deleted

## 2018-10-28 ENCOUNTER — Other Ambulatory Visit: Payer: Self-pay | Admitting: Physician Assistant

## 2018-10-28 DIAGNOSIS — I5033 Acute on chronic diastolic (congestive) heart failure: Secondary | ICD-10-CM

## 2018-10-28 DIAGNOSIS — C50412 Malignant neoplasm of upper-outer quadrant of left female breast: Secondary | ICD-10-CM

## 2018-10-29 NOTE — Progress Notes (Signed)
[place]  [phone] [phone]) [phone]    ID: [name] OB: [birth year]  MR#: [id number]  OAC#:[id number]  PCP: Mellody Dance, DO GYN:   SU: Stark Klein OTHER MD: Thea Silversmith, Charlena Cross   CHIEF COMPLAINT: Estrogen receptor positive breast cancer  TREATMENT: Anastrozole; denosumab/Prolia   BREAST CANCER HISTORY: From the initial intake note 06/14/2013:  [name] had screening mammography with tomography at [facility] 05/26/2013 showing some architectural distortion in the left breast superiorly. Ultrasound of the left breast 06/02/2013 showed a 2 cm irregular mass superiorly in the left breast, medial to the scar on the skin from a prior biopsy. This mass was palpable. 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  On 06/13/2013 the patient underwent bilateral breast MRI. This showed a 2.8 cm irregular enhancing mass in the superior left breast but no additional suspicious masses, and no abnormal adenopathy, and no findings in the right breast.  The patient's subsequent history is as detailed below.   INTERVAL HISTORY: Vonnetta returns today for follow-up and treatment of her estrogen receptor positive breast cancer. She was last seen here on 10/20/2017.   She continues on anastrozole.  She has no unusual side effects and hot flashes have become easier.  She does not feel she needs any intervention for this  She also continues on Prolia.  She tolerates this well  Clemma's last bone density screening on 05 23 2019 at [facility] showed a T score of -1.9.  Since her last visit here, she underwent a bilateral breast MRI with and without contrast on 11/25/2017 showing: Breast Density Category D.  No evidence of malignancy within either  breast. Expected postsurgical changes of the LEFT breast. On today's MRI, the heart appears to be enlarged, at least mildly increased since previous MRI. Patient's most recent prior chest x-ray reports in 2017 described the heart size as upper normal. Recommend follow-up chest x-ray now to compare with the previous exams.  She also underwent a digital diagnostic bilateral mammogram on 08/04/2018 at [facility] showing breast density category D.  There was no evidence of malignancy.    REVIEW OF SYSTEMS: Setsuko walks regularly and does a lot of gardening.  She is keeping appropriate pandemic precautions.  Her husband does most of the shopping.  A detailed review of systems today was otherwise noncontributory    PAST MEDICAL HISTORY: Past Medical History:  Diagnosis Date  . Allergic rhinitis, cause unspecified   . Aortic regurgitation 06/17/2015   Mild to moderate AR by echo 04/2015  . 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 Chronic cough   . Chronic diastolic heart failure ([place]) 06/17/2015  . CKD (chronic kidney disease), stage III ([place])   . Cough variant asthma   . Disease of pharynx or nasopharynx   . Diverticulosis   . Essential hypertension   . GERD (gastroesophageal reflux disease)   . Glaucoma   . Hiatal hernia   . Hyperlipidemia   . Internal hemorrhoids   . Laryngospasm   . Multinodular goiter   . Osteopenia   . Persistent atrial fibrillation    a. Dx 04/2015 at time of stroke. b. DCCV 06/2015 - did not hold. Amio started then stopped due to QT prolongation.  Marland Kitchen  Postmenopausal   . Radiation 10/22/14-11/23/14   Left Breast  . Stricture and stenosis of cervix     PAST SURGICAL HISTORY: Past Surgical History:  Procedure Laterality Date  . BREAST SURGERY    . CARDIOVERSION N/A 06/24/2015   Procedure: CARDIOVERSION;  Surgeon: Sueanne Margarita, MD;  Location: [place];  Service: Cardiovascular;  Laterality: N/A;  . CARDIOVERSION N/A  05/19/2016   Procedure: CARDIOVERSION;  Surgeon: Sueanne Margarita, MD;  [facility];  Service: Cardiovascular;  Laterality: N/A;  . COLONOSCOPY    . LUNG CANCER SURGERY  2003   resection carcinoid lingula-lt upper lobe  . RADIOACTIVE SEED GUIDED PARTIAL MASTECTOMY WITH AXILLARY SENTINEL LYMPH NODE BIOPSY Left 06/26/2014   Procedure: RADIOACTIVE SEED GUIDED PARTIAL MASTECTOMY WITH AXILLARY SENTINEL LYMPH NODE BIOPSY;  Surgeon: Stark Klein, MD;  Location: [place];  Service: General;  Laterality: Left;  . RE-EXCISION OF BREAST LUMPECTOMY Left 08/07/2014   Procedure: RE-EXCISION OF LEFT BREAST LUMPECTOMY;  Surgeon: Stark Klein, MD;  Location: [place];  Service: General;  Laterality: Left;  . RIGHT HEART CATH N/A 04/27/2016   Procedure: Right Heart Cath;  Surgeon: Larey Dresser, MD;  Location: [place] CV LAB;  Service: Cardiovascular;  Laterality: N/A;    FAMILY HISTORY Family History  Problem Relation Age of Onset  . Stroke Mother   . Hypertension Mother   . Hyperlipidemia Mother   . Stroke Father   . Transient ischemic attack Father   . Hyperlipidemia Father   . Hypertension Father   . Atrial fibrillation Son   . Heart attack Neg Hx    the patient's father died from pneumonia the age of 58, in the setting of dementia. The patient's mother died at the age of 60 following a stroke. The patient had one brother, 4 sisters. There is no history of breast or ovarian cancer in the family  GYNECOLOGIC HISTORY:  Menarche age 30, first live birth age 55, she is [place] P2. She did not use hormone replacement at menopause. She never took birth control pills.   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 Her husband [name] is retired from the [employer]) and then worked for [employer]. He is an avid Animator. Son [name] lives in [place] and is an Art gallery manager. Daughter  [name] lives in [place] and is a Astronomer. The patient has 5 grandchildren. She attends the [employer]    ADVANCED DIRECTIVES: In place   HEALTH MAINTENANCE: Social History   Tobacco Use  . Smoking status: Former Smoker    Packs/day: 0.75    Years: 5.00    Pack years: 3.75    Quit date: 06/14/1961    Years since quitting: 57.4  . Smokeless tobacco: Never Used  Substance Use Topics  . Alcohol use: Yes    Alcohol/week: 0.0 standard drinks    Comment: occasional wine  . Drug use: No     Colonoscopy:  PAP:  Bone density: 07/22/2017 at [facility] which showed a T score of -1.9  Lipid panel:  Allergies  Allergen Reactions  . Combigan [Brimonidine Tartrate-Timolol] Itching    Eyes itch, reddened  . Other Other (See Comments)    Per patient made OU red, Sore, and sensitivity to light  . Sulfa Antibiotics Rash  . Sulfamethoxazole-Trimethoprim Hives    Current Outpatient Medications  Medication Sig Dispense Refill  . albuterol (  PROVENTIL HFA;VENTOLIN HFA) 108 (90 Base) MCG/ACT inhaler Inhale 2 puffs into the lungs every 6 (six) hours as needed for wheezing or shortness of breath.    . anastrozole (ARIMIDEX) 1 MG tablet Take 1 tablet (1 mg total) by mouth daily. 90 tablet 3  . ARMOUR THYROID 60 MG tablet TAKE 1 TABLET DAILY BEFORE BREAKFAST 90 tablet 1  . atorvastatin (LIPITOR) 40 MG tablet TAKE 1 TABLET DAILY 90 tablet 1  . calcium-vitamin D (OSCAL WITH D) 500-200 MG-UNIT tablet Take 1 tablet by mouth daily with breakfast. 30 tablet 0  . digoxin (LANOXIN) 0.125 MG tablet TAKE 1 TABLET DAILY (NEED TO CALL AND MAKE APPOINTMENT BY APRIL FOR FURTHER REFILLS) 90 tablet 1  . diltiazem (CARDIZEM CD) 180 MG 24 hr capsule Take 1 capsule (180 mg total) by mouth daily. Please schedule an appt 1st attempt 90 capsule 0  . ELIQUIS 5 MG TABS tablet TAKE 1 TABLET TWICE A DAY 180 tablet 3  . fluticasone (FLONASE) 50 MCG/ACT nasal spray 1 spray each nostril following sinus rinses  twice daily 16 g 2  . fluticasone-salmeterol (ADVAIR HFA) 230-21 MCG/ACT inhaler USE 2 INHALATIONS TWICE A DAY 36 g 4  . furosemide (LASIX) 20 MG tablet TAKE 2 TABLETS EVERY MORNING AND 1 TABLET EVERY EVENING (NEED APPOINTMENT, 2ND ATTEMPT) 90 tablet 1  . gabapentin (NEURONTIN) 400 MG capsule TAKE 1 CAPSULE TWICE A DAY 180 capsule 1  . loratadine (CLARITIN) 10 MG tablet Take 10 mg by mouth daily as needed for allergies. Reported on 09/16/2015    . Multiple Vitamins-Iron (MULTIVITAMIN/IRON) TABS Take 1 tablet by mouth daily.     Marland Kitchen omeprazole (PRILOSEC) 20 MG capsule TAKE 1 CAPSULE TWICE A DAY 30 MINUTES BEFORE MEALS (BEFORE LUNCH AND DINNER) 180 capsule 1  . potassium chloride SA (K-DUR,KLOR-CON) 20 MEQ tablet Take 1 tablet by mouth twice a day 180 tablet 3  . Vitamin D, Ergocalciferol, (DRISDOL) 1.25 MG (50000 UT) CAPS capsule TAKE 1 CAPSULE EVERY WEDNESDAY AND SUNDAY 24 capsule 4   No current facility-administered medications for this visit.    Facility-Administered Medications Ordered in Other Visits  Medication Dose Route Frequency Provider Last Rate Last Dose  . denosumab (PROLIA) injection 60 mg  60 mg Subcutaneous Once Osha Errico, Virgie Dad, MD        OBJECTIVE: Middle-aged white woman who appears stated age  There were no vitals filed for this visit. Wt Readings from Last 3 Encounters:  08/15/18 131 lb (59.4 kg)  04/18/18 134 lb 4.8 oz (60.9 kg)  02/08/18 130 lb 9.6 oz (59.2 kg)   There is no height or weight on file to calculate BMI.    ECOG FS:1  Ocular: Sclerae unicteric, pupils round and equal Ear-nose-throat: Wearing a mask Lymphatic: No cervical or supraclavicular adenopathy Lungs no rales or rhonchi Heart regular rate and rhythm Abd soft, nontender, positive bowel sounds MSK no focal spinal tenderness, no joint edema Neuro: non-focal, well-oriented, appropriate affect Breasts: The right breast is benign.  The left breast has undergone lumpectomy and radiation.  There is  no evidence of disease recurrence.  Both axillae are benign.   LAB RESULTS:  CMP     Component Value Date/Time   NA 140 10/31/2018 1412   NA 143 11/05/2017 0842   NA 141 09/22/2016 1459   K 4.2 10/31/2018 1412   K 3.8 09/22/2016 1459   CL 101 10/31/2018 1412   CO2 30 10/31/2018 1412   CO2 26 09/22/2016 1459  GLUCOSE 95 10/31/2018 1412   GLUCOSE 116 09/22/2016 1459   GLUCOSE 88 01/13/2006 1012   BUN 17 10/31/2018 1412   BUN 18 11/05/2017 0842   BUN 19.8 09/22/2016 1459   CREATININE 1.09 (H) 10/31/2018 1412   CREATININE 1.1 09/22/2016 1459   CALCIUM 9.3 10/31/2018 1412   CALCIUM 9.2 09/22/2016 1459   PROT 7.1 10/31/2018 1412   PROT 6.8 11/05/2017 0842   PROT 7.1 09/22/2016 1459   ALBUMIN 3.9 10/31/2018 1412   ALBUMIN 4.4 11/05/2017 0842   ALBUMIN 3.8 09/22/2016 1459   AST 25 10/31/2018 1412   AST 15 09/22/2016 1459   ALT 36 10/31/2018 1412   ALT 21 09/22/2016 1459   ALKPHOS 90 10/31/2018 1412   ALKPHOS 98 09/22/2016 1459   BILITOT 0.7 10/31/2018 1412   BILITOT 0.65 09/22/2016 1459   GFRNONAA 49 (L) 10/31/2018 1412   GFRAA 56 (L) 10/31/2018 1412    I No results found for: SPEP  Lab Results  Component Value Date   WBC 8.1 10/31/2018   NEUTROABS 5.2 10/31/2018   HGB 14.9 10/31/2018   HCT 44.4 10/31/2018   MCV 94.3 10/31/2018   PLT 265 10/31/2018      Chemistry      Component Value Date/Time   NA 140 10/31/2018 1412   NA 143 11/05/2017 0842   NA 141 09/22/2016 1459   K 4.2 10/31/2018 1412   K 3.8 09/22/2016 1459   CL 101 10/31/2018 1412   CO2 30 10/31/2018 1412   CO2 26 09/22/2016 1459   BUN 17 10/31/2018 1412   BUN 18 11/05/2017 0842   BUN 19.8 09/22/2016 1459   CREATININE 1.09 (H) 10/31/2018 1412   CREATININE 1.1 09/22/2016 1459      Component Value Date/Time   CALCIUM 9.3 10/31/2018 1412   CALCIUM 9.2 09/22/2016 1459   ALKPHOS 90 10/31/2018 1412   ALKPHOS 98 09/22/2016 1459   AST 25 10/31/2018 1412   AST 15 09/22/2016 1459   ALT 36  10/31/2018 1412   ALT 21 09/22/2016 1459   BILITOT 0.7 10/31/2018 1412   BILITOT 0.65 09/22/2016 1459       No results found for: LABCA2  No components found for: LABCA125  No results for input(s): INR in the last 168 hours.  Urinalysis    Component Value Date/Time   COLORURINE YELLOW 04/28/2015 1114   APPEARANCEUR CLEAR 04/28/2015 1114   LABSPEC 1.010 04/28/2015 1114   PHURINE 6.5 04/28/2015 1114   GLUCOSEU NEGATIVE 04/28/2015 1114   HGBUR TRACE (A) 04/28/2015 1114   HGBUR negative 05/20/2007 0942   BILIRUBINUR NEGATIVE 04/28/2015 1114   KETONESUR NEGATIVE 04/28/2015 1114   PROTEINUR NEGATIVE 04/28/2015 1114   UROBILINOGEN 0.2 05/20/2007 0942   NITRITE NEGATIVE 04/28/2015 1114   LEUKOCYTESUR NEGATIVE 04/28/2015 1114    STUDIES: No results found.   ASSESSMENT: 78 y.o. [place] woman status post left breast upper outer quadrant biopsy 08/19/2013 for a clinical T2 N0, stage IIA invasive lobular breast cancer, estrogen receptor positive, progesterone receptor negative, with an MIB-1 of 20%. There was not sufficient tissue for HER-2 testing.  (1) neoadjuvant anastrozole started mid April 2015, discontinued early June 2015 with poor tolerance  (2) tamoxifen started first week in July 2015. Discontinued 06/20/15 after stroke.  (a) anastrozole resumed  07/01/2015   (3) osteopenia with a T score of -1.8 at the femoral necks 07/12/2013  (a) bone density at [facility]  07/09/2015 shows T score of -2.0  (b) Prolia started 09/24/2015  (  c)  bone density on 07/22/2017 showed a T score of -1.9  (4) left lumpectomy 06/26/2014 showed a pT1c pN0, stage IA invasive lobular carcinoma, grade 1, HER-2 negative, with positive margins   (a) additional surgery 08/07/2014 did not completely clear margins, but no further surgery was advised  (5) radiation completed 10/22/2014-11/23/2014 Site/dose:   Left breast/ 42.72 Gy at 2.67 Gy per fraction x 16 fractions.  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  This is very favorable.  She is tolerating anastrozole well and the plan will be to continue that a minimum of 5 years.  She continues to have very dense breasts.  I am hoping they will become less dense as she continues on the anastrozole.  In the meantime I think it would be prudent to do MRIs on a yearly basis.  She will receive Prolia today and again 6 months from now.  She will see me at that point.  She knows to call for any other issue that may develop before the next visit. Macall Mccroskey, Virgie Dad, MD  10/31/18 3:04 PM Medical Oncology and Hematology [facility] [address] [place], [place] [address] Tel. [phone]    Fax. [phone]  I, Jacqualyn Posey am acting as a Education administrator for Chauncey Cruel, MD.   I, Lurline Del MD, have reviewed the above documentation for accuracy and completeness, and I agree with the above.

## 2018-10-31 ENCOUNTER — Inpatient Hospital Stay: Payer: Medicare Other

## 2018-10-31 ENCOUNTER — Inpatient Hospital Stay (HOSPITAL_BASED_OUTPATIENT_CLINIC_OR_DEPARTMENT_OTHER): Payer: Medicare Other | Admitting: Oncology

## 2018-10-31 ENCOUNTER — Other Ambulatory Visit: Payer: Self-pay

## 2018-10-31 ENCOUNTER — Inpatient Hospital Stay: Payer: Medicare Other | Attending: Oncology

## 2018-10-31 VITALS — BP 128/68 | HR 84 | Temp 98.2°F | Resp 18 | Ht 60.0 in | Wt 132.1 lb

## 2018-10-31 DIAGNOSIS — M8580 Other specified disorders of bone density and structure, unspecified site: Secondary | ICD-10-CM | POA: Diagnosis not present

## 2018-10-31 DIAGNOSIS — C50919 Malignant neoplasm of unspecified site of unspecified female breast: Secondary | ICD-10-CM | POA: Diagnosis not present

## 2018-10-31 DIAGNOSIS — Z79811 Long term (current) use of aromatase inhibitors: Secondary | ICD-10-CM | POA: Insufficient documentation

## 2018-10-31 DIAGNOSIS — M8588 Other specified disorders of bone density and structure, other site: Secondary | ICD-10-CM | POA: Diagnosis not present

## 2018-10-31 DIAGNOSIS — C50412 Malignant neoplasm of upper-outer quadrant of left female breast: Secondary | ICD-10-CM | POA: Diagnosis not present

## 2018-10-31 DIAGNOSIS — Z170 Estrogen receptor positive status [ER+]: Secondary | ICD-10-CM | POA: Diagnosis not present

## 2018-10-31 DIAGNOSIS — Z7901 Long term (current) use of anticoagulants: Secondary | ICD-10-CM | POA: Insufficient documentation

## 2018-10-31 DIAGNOSIS — C3410 Malignant neoplasm of upper lobe, unspecified bronchus or lung: Secondary | ICD-10-CM

## 2018-10-31 DIAGNOSIS — Z1231 Encounter for screening mammogram for malignant neoplasm of breast: Secondary | ICD-10-CM | POA: Diagnosis not present

## 2018-10-31 LAB — CBC WITH DIFFERENTIAL (CANCER CENTER ONLY)
Abs Immature Granulocytes: 0.05 10*3/uL (ref 0.00–0.07)
Basophils Absolute: 0 10*3/uL (ref 0.0–0.1)
Basophils Relative: 0 %
Eosinophils Absolute: 0 10*3/uL (ref 0.0–0.5)
Eosinophils Relative: 0 %
HCT: 44.4 % (ref 36.0–46.0)
Hemoglobin: 14.9 g/dL (ref 12.0–15.0)
Immature Granulocytes: 1 %
Lymphocytes Relative: 26 %
Lymphs Abs: 2.1 10*3/uL (ref 0.7–4.0)
MCH: 31.6 pg (ref 26.0–34.0)
MCHC: 33.6 g/dL (ref 30.0–36.0)
MCV: 94.3 fL (ref 80.0–100.0)
Monocytes Absolute: 0.7 10*3/uL (ref 0.1–1.0)
Monocytes Relative: 9 %
Neutro Abs: 5.2 10*3/uL (ref 1.7–7.7)
Neutrophils Relative %: 64 %
Platelet Count: 265 10*3/uL (ref 150–400)
RBC: 4.71 MIL/uL (ref 3.87–5.11)
RDW: 12.8 % (ref 11.5–15.5)
WBC Count: 8.1 10*3/uL (ref 4.0–10.5)
nRBC: 0 % (ref 0.0–0.2)

## 2018-10-31 LAB — CMP (CANCER CENTER ONLY)
ALT: 36 U/L (ref 0–44)
AST: 25 U/L (ref 15–41)
Albumin: 3.9 g/dL (ref 3.5–5.0)
Alkaline Phosphatase: 90 U/L (ref 38–126)
Anion gap: 9 (ref 5–15)
BUN: 17 mg/dL (ref 8–23)
CO2: 30 mmol/L (ref 22–32)
Calcium: 9.3 mg/dL (ref 8.9–10.3)
Chloride: 101 mmol/L (ref 98–111)
Creatinine: 1.09 mg/dL — ABNORMAL HIGH (ref 0.44–1.00)
GFR, Est AFR Am: 56 mL/min — ABNORMAL LOW (ref >60)
GFR, Estimated: 49 mL/min — ABNORMAL LOW (ref >60)
Glucose, Bld: 95 mg/dL (ref 70–99)
Potassium: 4.2 mmol/L (ref 3.5–5.1)
Sodium: 140 mmol/L (ref 135–145)
Total Bilirubin: 0.7 mg/dL (ref 0.3–1.2)
Total Protein: 7.1 g/dL (ref 6.5–8.1)

## 2018-10-31 MED ORDER — DENOSUMAB 60 MG/ML ~~LOC~~ SOSY
PREFILLED_SYRINGE | SUBCUTANEOUS | Status: AC
  Filled 2018-10-31: qty 1

## 2018-10-31 MED ORDER — DENOSUMAB 60 MG/ML ~~LOC~~ SOSY
60.0000 mg | PREFILLED_SYRINGE | Freq: Once | SUBCUTANEOUS | Status: AC
  Administered 2018-10-31: 60 mg via SUBCUTANEOUS

## 2018-10-31 NOTE — Patient Instructions (Signed)

## 2018-11-01 ENCOUNTER — Other Ambulatory Visit: Payer: Self-pay | Admitting: Cardiology

## 2018-11-01 ENCOUNTER — Telehealth: Payer: Self-pay | Admitting: Oncology

## 2018-11-01 NOTE — Telephone Encounter (Signed)
I talk with patient regarding schedule  

## 2018-11-12 ENCOUNTER — Other Ambulatory Visit: Payer: Self-pay | Admitting: Cardiology

## 2018-11-28 ENCOUNTER — Other Ambulatory Visit: Payer: Self-pay

## 2018-11-28 ENCOUNTER — Encounter (HOSPITAL_COMMUNITY): Payer: Self-pay

## 2018-11-28 ENCOUNTER — Emergency Department (HOSPITAL_COMMUNITY)
Admission: EM | Admit: 2018-11-28 | Discharge: 2018-11-28 | Discharge status: Against Medical Advice | Payer: Medicare Other | Attending: Emergency Medicine | Admitting: Emergency Medicine

## 2018-11-28 DIAGNOSIS — Z5321 Procedure and treatment not carried out due to patient leaving prior to being seen by health care provider: Secondary | ICD-10-CM | POA: Diagnosis not present

## 2018-11-28 DIAGNOSIS — R42 Dizziness and giddiness: Secondary | ICD-10-CM | POA: Diagnosis present

## 2018-11-28 LAB — CBG MONITORING, ED: Glucose-Capillary: 87 mg/dL (ref 70–99)

## 2018-11-28 LAB — BASIC METABOLIC PANEL
Anion gap: 12 (ref 5–15)
BUN: 15 mg/dL (ref 8–23)
CO2: 26 mmol/L (ref 22–32)
Calcium: 9.7 mg/dL (ref 8.9–10.3)
Chloride: 98 mmol/L (ref 98–111)
Creatinine, Ser: 1.02 mg/dL — ABNORMAL HIGH (ref 0.44–1.00)
GFR calc Af Amer: >60 mL/min (ref >60)
GFR calc non Af Amer: 53 mL/min — ABNORMAL LOW (ref >60)
Glucose, Bld: 113 mg/dL — ABNORMAL HIGH (ref 70–99)
Potassium: 3.7 mmol/L (ref 3.5–5.1)
Sodium: 136 mmol/L (ref 135–145)

## 2018-11-28 LAB — CBC
HCT: 46.5 % — ABNORMAL HIGH (ref 36.0–46.0)
Hemoglobin: 16 g/dL — ABNORMAL HIGH (ref 12.0–15.0)
MCH: 32.5 pg (ref 26.0–34.0)
MCHC: 34.4 g/dL (ref 30.0–36.0)
MCV: 94.5 fL (ref 80.0–100.0)
Platelets: 359 10*3/uL (ref 150–400)
RBC: 4.92 MIL/uL (ref 3.87–5.11)
RDW: 12.8 % (ref 11.5–15.5)
WBC: 11.9 10*3/uL — ABNORMAL HIGH (ref 4.0–10.5)
nRBC: 0 % (ref 0.0–0.2)

## 2018-11-28 MED ORDER — SODIUM CHLORIDE 0.9% FLUSH: 3.0000 mL | Freq: Once | INTRAVENOUS | Status: DC

## 2018-11-28 NOTE — ED Notes (Signed)
No answer for vitals recheck x1 

## 2018-11-28 NOTE — ED Triage Notes (Signed)
Pt reports dizziness this morning and "stumbling" Denies a fall today. Pt a.o, nad noted.

## 2018-11-29 ENCOUNTER — Telehealth: Payer: Self-pay | Admitting: Cardiology

## 2018-11-29 NOTE — Telephone Encounter (Signed)
I spoke to the patient's husband who informed us that the patient went to the ED on 9/28 concerned about stroke.  Apparently they left without advisement, but have an appointment on 9/30 with Estella Husk.  They will monitor her symptoms today and update [name] with changes, if any.

## 2018-11-29 NOTE — Progress Notes (Deleted)
Cardiology Office Note    Date:  11/30/2018   ID:  Patricia [name], DOB 1940-04-17, MRN [id number]  PCP:  Mellody Dance, DO  Cardiologist: Fransico Him, MD EPS: None  Chief Complaint  Patient presents with  . Hospitalization Follow-up    History of Present Illness:  Patricia [name] is a 79 y.o. female with history of persistent AFib on Eliquis, CVA, amiodarone stopped because of prolonged QT, chronic diastolic CHF, mild-mod AI,normal NST 2017, lung CA resection 2003, breast CA s/p lumpectomy/Tamoxifen/XRT, HTN, HLD. RHC 04/2016 borderline pulmonary HTN.  LOV Dr. Radford Pax 11/2016, Dr. Curt Bears 05/2017 and opted for rate control.  11/29/18 patient was found slumped against a wall with slurred speech and weakness. Symptoms resolved spontaneously. Went to ER but left triage because of wait time. Labs stable and EKG unchanged.  Patient went into house to get checkbook. Husband found her laying on the floor slumped against the wall. She was awake but incoherent and wouldn't move. She was back to normal within 5 min.EMS blood sugar normal, vitals normal and stroke eval normal. Yesterday husband noticed her left eye was droopy but it's fine today. Also more fatigued.She feels off balance. Can't tell me who the president is or senator of [place]. Denies dizziness, or any prodrome. No loss of bowel or bladder funciton. No history of syncope, seizure. Hasn't missed any Eliquis doses.     Past Medical History:  Diagnosis Date  . Allergic rhinitis, cause unspecified   . Aortic regurgitation 06/17/2015   Mild to moderate AR by echo 04/2015  . 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 Chronic cough   . Chronic diastolic heart failure ([place]) 06/17/2015  . CKD (chronic kidney disease), stage III ([place])   . Cough variant asthma   . Disease of pharynx or nasopharynx   . Diverticulosis   . Essential hypertension   . GERD (gastroesophageal reflux disease)   .  Glaucoma   . Hiatal hernia   . Hyperlipidemia   . Internal hemorrhoids   . Laryngospasm   . Multinodular goiter   . Osteopenia   . Persistent atrial fibrillation    a. Dx 04/2015 at time of stroke. b. DCCV 06/2015 - did not hold. Amio started then stopped due to QT prolongation.  Marland Kitchen Postmenopausal   . Radiation 10/22/14-11/23/14   Left Breast  . Stricture and stenosis of cervix     Past Surgical History:  Procedure Laterality Date  . BREAST SURGERY    . CARDIOVERSION N/A 06/24/2015   Procedure: CARDIOVERSION;  Surgeon: Sueanne Margarita, MD;  Location: [place];  Service: Cardiovascular;  Laterality: N/A;  . CARDIOVERSION N/A 05/19/2016   Procedure: CARDIOVERSION;  Surgeon: Sueanne Margarita, MD;  [facility];  Service: Cardiovascular;  Laterality: N/A;  . COLONOSCOPY    . LUNG CANCER SURGERY  2003   resection carcinoid lingula-lt upper lobe  . RADIOACTIVE SEED GUIDED PARTIAL MASTECTOMY WITH AXILLARY SENTINEL LYMPH NODE BIOPSY Left 06/26/2014   Procedure: RADIOACTIVE SEED GUIDED PARTIAL MASTECTOMY WITH AXILLARY SENTINEL LYMPH NODE BIOPSY;  Surgeon: Stark Klein, MD;  Location: [place];  Service: General;  Laterality: Left;  . RE-EXCISION OF BREAST LUMPECTOMY Left 08/07/2014   Procedure: RE-EXCISION OF LEFT BREAST LUMPECTOMY;  Surgeon: Stark Klein, MD;  Location: [place];  Service: General;  Laterality: Left;  . RIGHT HEART CATH N/A 04/27/2016   Procedure: Right Heart  Cath;  Surgeon: Larey Dresser, MD;  Location: [place] [facility];  Service: Cardiovascular;  Laterality: N/A;    Current Medications: Current Meds  Medication Sig  . albuterol (PROVENTIL HFA;VENTOLIN HFA) 108 (90 Base) MCG/ACT inhaler Inhale 2 puffs into the lungs every 6 (six) hours as needed for wheezing or shortness of breath.  . anastrozole (ARIMIDEX) 1 MG tablet Take 1 tablet (1 mg total) by mouth daily.  Francia Greaves THYROID 60 MG tablet TAKE 1 TABLET DAILY BEFORE BREAKFAST  .  atorvastatin (LIPITOR) 40 MG tablet TAKE 1 TABLET DAILY  . calcium-vitamin D (OSCAL WITH D) 500-200 MG-UNIT tablet Take 1 tablet by mouth daily with breakfast.  . digoxin (LANOXIN) 0.125 MG tablet TAKE 1 TABLET DAILY (NEED TO CALL AND MAKE APPOINTMENT BY APRIL FOR FURTHER REFILLS)  . diltiazem (CARDIZEM CD) 180 MG 24 hr capsule TAKE 1 CAPSULE DAILY (PLEASE MAKE OVERDUE APPOINTMENT WITH DR. Radford Pax BEFORE ANYMORE REFILLS [phone])  . ELIQUIS 5 MG TABS tablet TAKE 1 TABLET TWICE A DAY  . fluticasone (FLONASE) 50 MCG/ACT nasal spray 1 spray each nostril following sinus rinses twice daily  . fluticasone-salmeterol (ADVAIR HFA) 230-21 MCG/ACT inhaler USE 2 INHALATIONS TWICE A DAY  . furosemide (LASIX) 20 MG tablet TAKE 2 TABLETS EVERY MORNING AND 1 TABLET EVERY EVENING (NEED APPOINTMENT, 2ND ATTEMPT)  . gabapentin (NEURONTIN) 400 MG capsule TAKE 1 CAPSULE TWICE A DAY  . Multiple Vitamins-Iron (MULTIVITAMIN/IRON) TABS Take 1 tablet by mouth daily.   Marland Kitchen omeprazole (PRILOSEC) 20 MG capsule TAKE 1 CAPSULE TWICE A DAY 30 MINUTES BEFORE MEALS (BEFORE LUNCH AND DINNER)  . potassium chloride SA (K-DUR,KLOR-CON) 20 MEQ tablet Take 1 tablet by mouth twice a day  . Vitamin D, Ergocalciferol, (DRISDOL) 1.25 MG (50000 UT) CAPS capsule TAKE 1 CAPSULE EVERY WEDNESDAY AND SUNDAY     Allergies:   Combigan [brimonidine tartrate-timolol], Other, Sulfa antibiotics, and Sulfamethoxazole-trimethoprim   Social History   Socioeconomic History  . Marital status: Married    Spouse name: Not on file  . Number of children: Not on file  . Years of education: Not on file  . Highest education level: Not on file  Occupational History  . Not on file  Social Needs  . Financial resource strain: Not on file  . Food insecurity    Worry: Not on file    Inability: Not on file  . Transportation needs    Medical: Not on file    Non-medical: Not on file  Tobacco Use  . Smoking status: Former Smoker    Packs/day: 0.75     Years: 5.00    Pack years: 3.75    Quit date: 06/14/1961    Years since quitting: 57.5  . Smokeless tobacco: Never Used  Substance and Sexual Activity  . Alcohol use: Yes    Alcohol/week: 0.0 standard drinks    Comment: occasional wine  . Drug use: No  . Sexual activity: Not Currently  Lifestyle  . Physical activity    Days per week: Not on file    Minutes per session: Not on file  . Stress: Not on file  Relationships  . Social Herbalist on phone: Not on file    Gets together: Not on file    Attends religious service: Not on file    Active member of club or organization: Not on file    Attends meetings of clubs or organizations: Not on file    Relationship status: Not on file  Other Topics Concern  . Not on file  Social History Narrative   Retired. Lives with husband.      Family History:  The patient's   family history includes Atrial fibrillation in her son; Hyperlipidemia in her father and mother; Hypertension in her father and mother; Stroke in her father and mother; Transient ischemic attack in her father.   ROS:   Please see the history of present illness.    Review of Systems  Constitution: Negative.  HENT: Negative.   Eyes: Negative.   Cardiovascular: Negative.   Respiratory: Negative.   Hematologic/Lymphatic: Negative.   Musculoskeletal: Negative.  Negative for joint pain.  Gastrointestinal: Negative.   Genitourinary: Negative.   Neurological: Positive for difficulty with concentration, loss of balance and weakness.  Psychiatric/Behavioral: Positive for memory loss.   All other systems reviewed and are negative.   PHYSICAL EXAM:   VS:  BP 116/60   Pulse 70   Ht 5' (1.524 m)   Wt 129 lb 1.9 oz (58.6 kg)   SpO2 97%   BMI 25.22 kg/m   Physical Exam  GEN: Well nourished, well developed, in no acute distress  HEENT: normal  Neck: no JVD, carotid bruits, or masses Cardiac:RRR; no murmurs, rubs, or gallops  Respiratory:  clear to auscultation  bilaterally, normal work of breathing GI: soft, nontender, nondistended, + BS Ext: without cyanosis, clubbing, or edema, Good distal pulses bilaterally MS: no deformity or atrophy  Skin: warm and dry, no rash Neuro:  Alert and Oriented x 2, Strength and sensation are intact Off balance, can't tell me who [name] president is or [place] senator Psych: euthymic mood, full affect  Wt Readings from Last 3 Encounters:  11/30/18 129 lb 1.9 oz (58.6 kg)  10/31/18 132 lb 1.6 oz (59.9 kg)  08/15/18 131 lb (59.4 kg)      Studies/Labs Reviewed:   EKG:  EKG is not ordered today.  The ekg reviewed from EF 11/28/18 Afib with old septal MI and inf/lat ST changes similar to 05/2017  Recent Labs: 10/31/2018: ALT 36 11/28/2018: BUN 15; Creatinine, Ser 1.02; Hemoglobin 16.0; Platelets 359; Potassium 3.7; Sodium 136   Lipid Panel    Component Value Date/Time   CHOL 145 11/05/2017 0842   TRIG 123 11/05/2017 0842   TRIG 52 01/13/2006 1012   HDL 54 11/05/2017 0842   CHOLHDL 2.7 11/05/2017 0842   CHOLHDL 3 04/01/2016 0952   VLDL 19.2 04/01/2016 0952   LDLCALC 66 11/05/2017 0842   LDLDIRECT 174.3 06/04/2009 0000    Additional studies/ records that were reviewed today include:   Echo 2/8/18Study Conclusions   - Left ventricle: The cavity size was normal. Wall thickness was   normal. Systolic function was normal. The estimated ejection   fraction was in the range of 55% to 60%. Wall motion was normal;   there were no regional wall motion abnormalities. Features are   consistent with a pseudonormal left ventricular filling pattern,   with concomitant abnormal relaxation and increased filling   pressure (grade 2 diastolic dysfunction). Doppler parameters are   consistent with high ventricular filling pressure. - Aortic valve: There was mild regurgitation. - Mitral valve: Calcified annulus. There was mild regurgitation. - Left atrium: The atrium was severely dilated. - Tricuspid valve: There was moderate  regurgitation. - Pulmonary arteries: Systolic pressure was moderately increased.   PA peak pressure: 52 mm Hg (S).   Impressions:   - Normal LV systolic function; grade 2 diastolic dysfunction;   elevated  LV filling pressure; mild AI; mild MR; severe LAE;   moderate TR with moderately elevated pulmonary pressure.  [place] 2/20181. Mildly elevated right and left heart filling pressures.  2. Borderline pulmonary hypertension.    Overall, hemodynamics look ok. I do not think she has significant pulmonary hypertension.  However, she is in atrial fibrillation with RVR today.  She is not on rate control meds at home.  I will start diltiazem CD 180 mg daily, giving dose now.  She will restart Eliquis this evening.  I will have her followup in atrial fibrillation clinic.  If she does not revert to NSR, would consider DCCV.  She is, of note, not particularly symptomatic (did not know that she was back in atrial fibrillation    ASSESSMENT:    1. Suspected cerebrovascular accident (CVA)   2. Persistent atrial fibrillation   3. Essential hypertension   4. Chronic diastolic heart failure ([place])   5. Aortic valve insufficiency, etiology of cardiac valve disease unspecified   6. Hyperlipidemia, unspecified hyperlipidemia type   7. History of CVA (cerebrovascular accident)   8. CKD (chronic kidney disease), stage III ([place])      PLAN:  In order of problems listed above:  Suspected CVA-patient found slumped on the floor disoriented 2 days ago went to ER but wait was too long so left before seen. She had eye drooping yest, normal today, off balance, can't tell me who president is. Is in Afib rate controlled, hasn't missed eliquis dose. I spoke with Dr.Dohmeier who recommends inpatient workup as this is only 29 days old and will facilitate treatment. She will alert her team at [facility] so the patient is seen quickly and avoid wait in ER. Patient and husband agreeable.  History of CVA 2017 in setting of Afib.  Suspect another CVA 2 days ago. Recent memory problems as well  Paroxysmal Afib on Eliquis  Essential HTN BP compensated  Chronic diastolic CHF compensated  Aortic regurgitation last echo 2018  HLD LDL 66 10/2017  CKD stage 3 Crt 1.02-9/28/20     Medication Adjustments/Labs and Tests Ordered: Current medicines are reviewed at length with the patient today.  Concerns regarding medicines are outlined above.  Medication changes, Labs and Tests ordered today are listed in the Patient Instructions below. Patient Instructions  YOU ARE BEING SENT TO THE EMERGENCY ROOM    Signed, Ermalinda Barrios, PA-C  11/30/2018 10:16 AM    [facility] [place], [place], [place]  [address] Phone: [phone]; Fax: [phone]

## 2018-11-29 NOTE — Telephone Encounter (Signed)
°  Spouse calling to report on 9/28 around 3pm he found patient slumped against a wall. Patient had slurred speech and weakness temporarily  EMS called , went to ED, started feeling better,  left after being triaged due to the wait time.  Patient feeling cold and tired today. No other symptoms . Requesting call from nurse

## 2018-11-30 ENCOUNTER — Emergency Department (HOSPITAL_COMMUNITY): Payer: Medicare Other

## 2018-11-30 ENCOUNTER — Telehealth: Payer: Self-pay | Admitting: Neurology

## 2018-11-30 ENCOUNTER — Other Ambulatory Visit: Payer: Self-pay

## 2018-11-30 ENCOUNTER — Encounter: Payer: Self-pay | Admitting: Physician Assistant

## 2018-11-30 ENCOUNTER — Emergency Department (HOSPITAL_COMMUNITY)
Admission: EM | Admit: 2018-11-30 | Discharge: 2018-11-30 | Disposition: A | Discharge status: Home / Self Care | Payer: Medicare Other | Attending: Emergency Medicine | Admitting: Emergency Medicine

## 2018-11-30 ENCOUNTER — Ambulatory Visit (INDEPENDENT_AMBULATORY_CARE_PROVIDER_SITE_OTHER): Payer: Medicare Other | Admitting: Physician Assistant

## 2018-11-30 VITALS — BP 116/60 | HR 70 | Ht 60.0 in | Wt 129.1 lb

## 2018-11-30 DIAGNOSIS — I4819 Other persistent atrial fibrillation: Secondary | ICD-10-CM | POA: Diagnosis not present

## 2018-11-30 DIAGNOSIS — R0989 Other specified symptoms and signs involving the circulatory and respiratory systems: Secondary | ICD-10-CM

## 2018-11-30 DIAGNOSIS — R270 Ataxia, unspecified: Secondary | ICD-10-CM | POA: Diagnosis not present

## 2018-11-30 DIAGNOSIS — R2681 Unsteadiness on feet: Secondary | ICD-10-CM | POA: Insufficient documentation

## 2018-11-30 DIAGNOSIS — I5032 Chronic diastolic (congestive) heart failure: Secondary | ICD-10-CM | POA: Diagnosis not present

## 2018-11-30 DIAGNOSIS — Z8673 Personal history of transient ischemic attack (TIA), and cerebral infarction without residual deficits: Secondary | ICD-10-CM

## 2018-11-30 DIAGNOSIS — N183 Chronic kidney disease, stage 3 unspecified: Secondary | ICD-10-CM

## 2018-11-30 DIAGNOSIS — I130 Hypertensive heart and chronic kidney disease with heart failure and stage 1 through stage 4 chronic kidney disease, or unspecified chronic kidney disease: Secondary | ICD-10-CM | POA: Diagnosis not present

## 2018-11-30 DIAGNOSIS — R4189 Other symptoms and signs involving cognitive functions and awareness: Secondary | ICD-10-CM | POA: Insufficient documentation

## 2018-11-30 DIAGNOSIS — Z20828 Contact with and (suspected) exposure to other viral communicable diseases: Secondary | ICD-10-CM | POA: Insufficient documentation

## 2018-11-30 DIAGNOSIS — R531 Weakness: Secondary | ICD-10-CM | POA: Diagnosis not present

## 2018-11-30 DIAGNOSIS — I10 Essential (primary) hypertension: Secondary | ICD-10-CM | POA: Diagnosis not present

## 2018-11-30 DIAGNOSIS — Z79899 Other long term (current) drug therapy: Secondary | ICD-10-CM | POA: Insufficient documentation

## 2018-11-30 DIAGNOSIS — Z85118 Personal history of other malignant neoplasm of bronchus and lung: Secondary | ICD-10-CM | POA: Insufficient documentation

## 2018-11-30 DIAGNOSIS — Z7901 Long term (current) use of anticoagulants: Secondary | ICD-10-CM | POA: Diagnosis not present

## 2018-11-30 DIAGNOSIS — E785 Hyperlipidemia, unspecified: Secondary | ICD-10-CM | POA: Diagnosis not present

## 2018-11-30 DIAGNOSIS — I4891 Unspecified atrial fibrillation: Secondary | ICD-10-CM | POA: Diagnosis not present

## 2018-11-30 DIAGNOSIS — Z87891 Personal history of nicotine dependence: Secondary | ICD-10-CM | POA: Diagnosis not present

## 2018-11-30 DIAGNOSIS — I351 Nonrheumatic aortic (valve) insufficiency: Secondary | ICD-10-CM | POA: Diagnosis not present

## 2018-11-30 DIAGNOSIS — R464 Slowness and poor responsiveness: Secondary | ICD-10-CM | POA: Diagnosis not present

## 2018-11-30 LAB — URINALYSIS, ROUTINE W REFLEX MICROSCOPIC
Bilirubin Urine: NEGATIVE
Glucose, UA: NEGATIVE mg/dL
Hgb urine dipstick: NEGATIVE
Ketones, ur: NEGATIVE mg/dL
Nitrite: NEGATIVE
Protein, ur: NEGATIVE mg/dL
Specific Gravity, Urine: 1.012 (ref 1.005–1.030)
pH: 6 (ref 5.0–8.0)

## 2018-11-30 LAB — COMPREHENSIVE METABOLIC PANEL
ALT: 29 U/L (ref 0–44)
AST: 23 U/L (ref 15–41)
Albumin: 3.4 g/dL — ABNORMAL LOW (ref 3.5–5.0)
Alkaline Phosphatase: 71 U/L (ref 38–126)
Anion gap: 13 (ref 5–15)
BUN: 17 mg/dL (ref 8–23)
CO2: 24 mmol/L (ref 22–32)
Calcium: 8.9 mg/dL (ref 8.9–10.3)
Chloride: 99 mmol/L (ref 98–111)
Creatinine, Ser: 1.14 mg/dL — ABNORMAL HIGH (ref 0.44–1.00)
GFR calc Af Amer: 53 mL/min — ABNORMAL LOW (ref >60)
GFR calc non Af Amer: 46 mL/min — ABNORMAL LOW (ref >60)
Glucose, Bld: 134 mg/dL — ABNORMAL HIGH (ref 70–99)
Potassium: 3.7 mmol/L (ref 3.5–5.1)
Sodium: 136 mmol/L (ref 135–145)
Total Bilirubin: 1.7 mg/dL — ABNORMAL HIGH (ref 0.3–1.2)
Total Protein: 7.4 g/dL (ref 6.5–8.1)

## 2018-11-30 LAB — DIFFERENTIAL
Abs Immature Granulocytes: 0.07 10*3/uL (ref 0.00–0.07)
Basophils Absolute: 0 10*3/uL (ref 0.0–0.1)
Basophils Relative: 0 %
Eosinophils Absolute: 0 10*3/uL (ref 0.0–0.5)
Eosinophils Relative: 0 %
Immature Granulocytes: 1 %
Lymphocytes Relative: 11 %
Lymphs Abs: 1.4 10*3/uL (ref 0.7–4.0)
Monocytes Absolute: 1.1 10*3/uL — ABNORMAL HIGH (ref 0.1–1.0)
Monocytes Relative: 8 %
Neutro Abs: 10.1 10*3/uL — ABNORMAL HIGH (ref 1.7–7.7)
Neutrophils Relative %: 80 %

## 2018-11-30 LAB — RAPID URINE DRUG SCREEN, HOSP PERFORMED
Amphetamines: NOT DETECTED
Barbiturates: NOT DETECTED
Benzodiazepines: NOT DETECTED
Cocaine: NOT DETECTED
Opiates: NOT DETECTED
Tetrahydrocannabinol: NOT DETECTED

## 2018-11-30 LAB — CBC
HCT: 43.7 % (ref 36.0–46.0)
Hemoglobin: 14.4 g/dL (ref 12.0–15.0)
MCH: 31.8 pg (ref 26.0–34.0)
MCHC: 33 g/dL (ref 30.0–36.0)
MCV: 96.5 fL (ref 80.0–100.0)
Platelets: 313 10*3/uL (ref 150–400)
RBC: 4.53 MIL/uL (ref 3.87–5.11)
RDW: 12.8 % (ref 11.5–15.5)
WBC: 12.7 10*3/uL — ABNORMAL HIGH (ref 4.0–10.5)
nRBC: 0 % (ref 0.0–0.2)

## 2018-11-30 LAB — PROTIME-INR
INR: 1.7 — ABNORMAL HIGH (ref 0.8–1.2)
Prothrombin Time: 19.9 seconds — ABNORMAL HIGH (ref 11.4–15.2)

## 2018-11-30 LAB — TROPONIN I (HIGH SENSITIVITY)
Troponin I (High Sensitivity): 12 ng/L (ref <18)
Troponin I (High Sensitivity): 11 ng/L (ref <18)

## 2018-11-30 LAB — APTT: aPTT: 39 seconds — ABNORMAL HIGH (ref 24–36)

## 2018-11-30 LAB — SARS CORONAVIRUS 2 (TAT 6-24 HRS): SARS Coronavirus 2: NEGATIVE

## 2018-11-30 MED ORDER — IOHEXOL 300 MG/ML  SOLN: 80.0000 mL | Freq: Once | INTRAMUSCULAR | Status: DC | PRN

## 2018-11-30 MED ORDER — IOHEXOL 350 MG/ML SOLN
80.0000 mL | Freq: Once | INTRAVENOUS | Status: AC | PRN
  Administered 2018-11-30: 14:00:00 100 mL via INTRAVENOUS

## 2018-11-30 NOTE — ED Notes (Signed)
Patient discharged to home and discharge instructions discussed. Patient was able to teach-back the education she received. Patient left the facility with her husband.

## 2018-11-30 NOTE — Procedures (Addendum)
Patient Name: [name]  MRN: [id number]  Epilepsy Attending: Lora Havens  Referring Physician/Provider: Etta Quill, PA Date: 11/30/2018  Duration: 2  Patient history: 78yo F with transient weakness and fall. EEG to evaluate for seizure.  Level of alertness: awake, asleep  AEDs during EEG study: None  Technical aspects: This EEG study was done with scalp electrodes positioned according to the 10-20 International system of electrode placement. Electrical activity was acquired at a sampling rate of 500Hz  and reviewed with a high frequency filter of 70Hz  and a low frequency filter of 1Hz . EEG data were recorded continuously and digitally stored.   DESCRIPTION: The posterior dominant rhythm consists of 9-10 Hz activity of moderate voltage (25-35 uV) seen predominantly in posterior head regions, symmetric and reactive to eye opening and eye closing. Sleep was characterized by sleep spindles ( 12-14hz ), maximal frontocentral. There was intermittent 2-3Hz  delta slowing, in left and right temporal region.  Hyperventilation and photic stimulation were not performed.  ABNORMALITY: - Intermittent slow, bitemporal  IMPRESSION: This study is suggestive of non specific cortical dysfunction in bitemporal region. No seizures or epileptiform discharges were seen throughout the recording.  Lenora Gomes Barbra Sarks

## 2018-11-30 NOTE — ED Triage Notes (Signed)
Patient states that she came to the ER because yesterday she was talking while standing up and reached for an object. When she did, she leaned against a mobile chair and slid down the wall. It was at this time the husband says she was disoriented. The patient states she feels the way she does normally now.

## 2018-11-30 NOTE — Consult Note (Addendum)
Neurology Consultation  Reason for Consult: stroke Referring Physician: Melina Copa  History is obtained from: husband and patient who has some neurodegenerative decline  HPI: Patricia [name] is a 78 y.o. female past medical history of left breast cancer with radiation, persistent atrial fibrillation on Eliquis, multinodular goiter, laryngeal spasms, essential hypertension, chronic kidney disease, diastolic heart failure, and hyperlipidemia.  Per husband patient was normal on Monday until approximately 12:00.  Patient was sent up to get a checkbook.  She recalls trying to move a rolling chair and then suddenly she fell backwards toward the wall and slid down to the floor.  She states that she recalls some of what happened but there was an amnestic to it she does not recall her husband coming up in finding her.  Husband states that there was no shaking, urinary incontinence however she was talking nonsensically.  He called EMS and by the time EMS had come she was back to her baseline.  Could not give me a significant timeline.  Patient denies any type of tunnel vision or sensation as though she was going to faint at that time.  Patient states since that event she is noticed that she has been off balance and using the wall for balance.  She does not tend to lean one way or the other.  Patient denies missing any of her Eliquis.  Patient was seen for stroke back in 2017.  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  Echo was normal at 55 to 60%, LDL was 95, HbA1c was 5.7 and patient was changed from aspirin to Eliquis at that time.   LKW: 1200 on 11/28/2018 tpa given?: no, out of the window Premorbid modified Rankin scale (mRS): 0 NIH stroke score of 0   ROS: A 14 point ROS was performed and is negative except as noted in the HPI.   Past Medical History:  Diagnosis Date  . Allergic rhinitis, cause unspecified   . Aortic  regurgitation 06/17/2015   Mild to moderate AR by echo 04/2015  . 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 Chronic cough   . Chronic diastolic heart failure ([place]) 06/17/2015  . CKD (chronic kidney disease), stage III ([place])   . Cough variant asthma   . Disease of pharynx or nasopharynx   . Diverticulosis   . Essential hypertension   . GERD (gastroesophageal reflux disease)   . Glaucoma   . Hiatal hernia   . Hyperlipidemia   . Internal hemorrhoids   . Laryngospasm   . Multinodular goiter   . Osteopenia   . Persistent atrial fibrillation    a. Dx 04/2015 at time of stroke. b. DCCV 06/2015 - did not hold. Amio started then stopped due to QT prolongation.  Marland Kitchen Postmenopausal   . Radiation 10/22/14-11/23/14   Left Breast  . Stricture and stenosis of cervix     Family History  Problem Relation Age of Onset  . Stroke Mother   . Hypertension Mother   . Hyperlipidemia Mother   . Stroke Father   . Transient ischemic attack Father   . Hyperlipidemia Father   . Hypertension Father   . Atrial fibrillation Son   . Heart attack Neg Hx     Social History:   reports that she quit smoking about 57 years ago. She has a 3.75 pack-year smoking history.  She has never used smokeless tobacco. She reports current alcohol use. She reports that she does not use drugs.  Medications No current facility-administered medications for this encounter.   Current Outpatient Medications:  .  albuterol (PROVENTIL HFA;VENTOLIN HFA) 108 (90 Base) MCG/ACT inhaler, Inhale 2 puffs into the lungs every 6 (six) hours as needed for wheezing or shortness of breath., Disp: , Rfl:  .  anastrozole (ARIMIDEX) 1 MG tablet, Take 1 tablet (1 mg total) by mouth daily., Disp: 90 tablet, Rfl: 3 .  ARMOUR THYROID 60 MG tablet, TAKE 1 TABLET DAILY BEFORE BREAKFAST, Disp: 90 tablet, Rfl: 1 .  atorvastatin (LIPITOR) 40 MG tablet, TAKE 1 TABLET DAILY, Disp: 90 tablet, Rfl: 1 .   calcium-vitamin D (OSCAL WITH D) 500-200 MG-UNIT tablet, Take 1 tablet by mouth daily with breakfast., Disp: 30 tablet, Rfl: 0 .  digoxin (LANOXIN) 0.125 MG tablet, TAKE 1 TABLET DAILY (NEED TO CALL AND MAKE APPOINTMENT BY APRIL FOR FURTHER REFILLS), Disp: 90 tablet, Rfl: 1 .  diltiazem (CARDIZEM CD) 180 MG 24 hr capsule, TAKE 1 CAPSULE DAILY (PLEASE MAKE OVERDUE APPOINTMENT WITH DR. Radford Pax BEFORE ANYMORE REFILLS [phone]), Disp: 15 capsule, Rfl: 0 .  ELIQUIS 5 MG TABS tablet, TAKE 1 TABLET TWICE A DAY, Disp: 180 tablet, Rfl: 3 .  fluticasone (FLONASE) 50 MCG/ACT nasal spray, 1 spray each nostril following sinus rinses twice daily, Disp: 16 g, Rfl: 2 .  fluticasone-salmeterol (ADVAIR HFA) 230-21 MCG/ACT inhaler, USE 2 INHALATIONS TWICE A DAY, Disp: 36 g, Rfl: 4 .  furosemide (LASIX) 20 MG tablet, TAKE 2 TABLETS EVERY MORNING AND 1 TABLET EVERY EVENING (NEED APPOINTMENT, 2ND ATTEMPT), Disp: 90 tablet, Rfl: 1 .  gabapentin (NEURONTIN) 400 MG capsule, TAKE 1 CAPSULE TWICE A DAY, Disp: 180 capsule, Rfl: 1 .  Multiple Vitamins-Iron (MULTIVITAMIN/IRON) TABS, Take 1 tablet by mouth daily. , Disp: , Rfl:  .  omeprazole (PRILOSEC) 20 MG capsule, TAKE 1 CAPSULE TWICE A DAY 30 MINUTES BEFORE MEALS (BEFORE LUNCH AND DINNER), Disp: 180 capsule, Rfl: 1 .  potassium chloride SA (K-DUR,KLOR-CON) 20 MEQ tablet, Take 1 tablet by mouth twice a day, Disp: 180 tablet, Rfl: 3 .  Vitamin D, Ergocalciferol, (DRISDOL) 1.25 MG (50000 UT) CAPS capsule, TAKE 1 CAPSULE EVERY WEDNESDAY AND SUNDAY, Disp: 24 capsule, Rfl: 4  Facility-Administered Medications Ordered in Other Encounters:  .  denosumab (PROLIA) injection 60 mg, 60 mg, Subcutaneous, Once, Magrinat, Virgie Dad, MD   Exam: Current vital signs: BP 123/67   Pulse 91   Temp 98.4 F (36.9 C) (Oral)   Resp (!) 37   Ht 5' (1.524 m)   Wt 58.6 kg   SpO2 91%   BMI 25.23 kg/m  Vital signs in last 24 hours: Temp:  [98.4 F (36.9 C)] 98.4 F (36.9 C) (09/30  1032) Pulse Rate:  [70-91] 91 (09/30 1145) Resp:  [14-37] 37 (09/30 1145) BP: (116-133)/(60-84) 123/67 (09/30 1145) SpO2:  [91 %-99 %] 91 % (09/30 1145) Weight:  [58.6 kg] 58.6 kg (09/30 1053)  Physical Exam  Constitutional: Appears well-developed and well-nourished.  Psych: Affect appropriate to situation Eyes: No scleral injection HENT: No OP obstrucion Head: Normocephalic.  Cardiovascular: Normal rate and regular rhythm.  Respiratory: Effort normal, non-labored breathing GI: Soft.  No distension. There is no tenderness.  Skin: WDI  Neuro: Mental Status: Patient is awake, alert, oriented to person, place, month, year, and situation. Patient is able to give a clear and coherent history. No signs of aphasia or neglect  Cranial Nerves: II: Visual Fields are full.  III,IV, VI: EOMI without ptosis or diploplia. Pupils equal, round and reactive to light V: Facial sensation is symmetric to temperature VII: Facial movement is symmetric.  VIII: hearing is intact to voice X: Palat elevates symmetrically XI: Shoulder shrug is symmetric. XII: tongue is midline without atrophy or fasciculations.  Motor: Tone is normal. Bulk is normal. 5/5 strength was present in all four extremities.  Sensory: Sensation is symmetric to light touch and temperature in the arms and legs. Deep Tendon Reflexes: 2+ and symmetric in the biceps and patellae.  Plantars: Toes are downgoing bilaterally.  Cerebellar: FNF and HKS are intact bilaterally  Labs I have reviewed labs in epic and the results pertinent to this consultation are:   CBC    Component Value Date/Time   WBC 11.9 (H) 11/28/2018 1746   RBC 4.92 11/28/2018 1746   HGB 16.0 (H) 11/28/2018 1746   HGB 14.9 10/31/2018 1412   HGB 15.4 11/05/2017 0842   HGB 15.1 09/22/2016 1459   HCT 46.5 (H) 11/28/2018 1746   HCT 45.6 11/05/2017 0842   HCT 44.7 09/22/2016 1459   PLT 359 11/28/2018 1746   PLT 265 10/31/2018 1412   PLT 263 11/05/2017  0842   MCV 94.5 11/28/2018 1746   MCV 94 11/05/2017 0842   MCV 93.6 09/22/2016 1459   MCH 32.5 11/28/2018 1746   MCHC 34.4 11/28/2018 1746   RDW 12.8 11/28/2018 1746   RDW 13.4 11/05/2017 0842   RDW 13.7 09/22/2016 1459   LYMPHSABS 2.1 10/31/2018 1412   LYMPHSABS 1.6 11/05/2016 0831   LYMPHSABS 2.3 09/22/2016 1459   MONOABS 0.7 10/31/2018 1412   MONOABS 0.7 09/22/2016 1459   EOSABS 0.0 10/31/2018 1412   EOSABS 0.0 11/05/2016 0831   BASOSABS 0.0 10/31/2018 1412   BASOSABS 0.0 11/05/2016 0831   BASOSABS 0.1 09/22/2016 1459    CMP     Component Value Date/Time   NA 136 11/28/2018 1746   NA 143 11/05/2017 0842   NA 141 09/22/2016 1459   K 3.7 11/28/2018 1746   K 3.8 09/22/2016 1459   CL 98 11/28/2018 1746   CO2 26 11/28/2018 1746   CO2 26 09/22/2016 1459   GLUCOSE 113 (H) 11/28/2018 1746   GLUCOSE 116 09/22/2016 1459   GLUCOSE 88 01/13/2006 1012   BUN 15 11/28/2018 1746   BUN 18 11/05/2017 0842   BUN 19.8 09/22/2016 1459   CREATININE 1.02 (H) 11/28/2018 1746   CREATININE 1.09 (H) 10/31/2018 1412   CREATININE 1.1 09/22/2016 1459   CALCIUM 9.7 11/28/2018 1746   CALCIUM 9.2 09/22/2016 1459   PROT 7.1 10/31/2018 1412   PROT 6.8 11/05/2017 0842   PROT 7.1 09/22/2016 1459   ALBUMIN 3.9 10/31/2018 1412   ALBUMIN 4.4 11/05/2017 0842   ALBUMIN 3.8 09/22/2016 1459   AST 25 10/31/2018 1412   AST 15 09/22/2016 1459   ALT 36 10/31/2018 1412   ALT 21 09/22/2016 1459   ALKPHOS 90 10/31/2018 1412   ALKPHOS 98 09/22/2016 1459   BILITOT 0.7 10/31/2018 1412   BILITOT 0.65 09/22/2016 1459   GFRNONAA 53 (L) 11/28/2018 1746   GFRNONAA 49 (L) 10/31/2018 1412   GFRAA >60 11/28/2018 1746   GFRAA 56 (L) 10/31/2018 1412    Lipid Panel     Component Value Date/Time   CHOL 145 11/05/2017 0842   TRIG 123 11/05/2017 0842   TRIG 52 01/13/2006 1012   HDL 54 11/05/2017 0842  CHOLHDL 2.7 11/05/2017 0842   CHOLHDL 3 04/01/2016 0952   VLDL 19.2 04/01/2016 0952   LDLCALC 66 11/05/2017  0842   LDLDIRECT 174.3 06/04/2009 0000     Imaging I have reviewed the images obtained:    CTA of head and neck- pending  MRI examination of the brain- pending  Etta Quill PA-C [facility] [phone]  M-F  (9:00 am- 5:00 PM)  11/30/2018, 12:02 PM  I have seen the patient and reviewed the above note.  It is unclear exactly how long she was confused for but she did feel lightheaded prior to going down.  She is continued to feel slightly woozy since that time.   Assessment: 78 year old female with transient alteration of awareness of unclear etiology.  The description with period of amnesia and the description of going down, are not clearly consistent with transient ischemic attack.  Even if it was TIA, she is already anticoagulated and vascular imaging does not reveal any clearly actionable lesions.  There is no description of any type of seizure activity and the fact that she remembers going down would argue against this, but could be a possibility.  Finally, I think most likely this represents syncope/presyncope.  I would favor further evaluation with MRI/EEG, but if these are negative, then I am not certain how much benefit we would obtain through further work-up.  Certainly if she has recurrent spells, then further consideration of seizure or other etiologies may need to be pursued.  Impression: Transient alteration of awareness  Recommend MRI EEG If negative, could follow-up with outpatient neurology  Roland Rack, MD [facility] [phone]  If 7pm- 7am, please page neurology on call as listed in [place].

## 2018-11-30 NOTE — Telephone Encounter (Signed)
[facility] PA member called and asked about urgent work in for a known stroke patient on anticoagulation having stroke symptoms for 36 hours , progressing .  I am not sure from which office, and which speciality-" across from hospital "  I urged the proivder to send patient to [place] ED , and to announce her as a stroke, not code stroke. There is a high risk of bleeding.  Inpatient work up will be faster than out-patient.   Pramod, can you help with facilitating her being seen in ED without a waiting time?  CD   Addendum, Dr Leonie Man will contact neuro hospitalist, patient should go to [place] -

## 2018-11-30 NOTE — Patient Instructions (Signed)
YOU ARE BEING SENT TO THE EMERGENCY ROOM

## 2018-11-30 NOTE — Discharge Instructions (Addendum)
You were seen in the emergency department for evaluation of an unresponsive episode.  You had a neurology consult along with an MRI and a CAT scan that did not show an obvious explanation for your symptoms.  The recommendation from neurology is that you not drive until you follow-up outpatient with your primary neurologist [facility] neurology.  It is possible that this was a syncopal or fainting spell.  You should stay well-hydrated and continue your regular medications.  Please return to the emergency department if any concerns.

## 2018-11-30 NOTE — Telephone Encounter (Signed)
Thanks. She was seen. No acute stroke or hemorrhage

## 2018-11-30 NOTE — ED Provider Notes (Signed)
[facility] EMERGENCY DEPARTMENT Provider Note   CSN: [id number] Arrival date & time: 11/30/18  1028     History   Chief Complaint Chief Complaint  Patient presents with   Weakness    HPI Patricia [name] is a 78 y.o. female.  She was sent here from cardiology office for evaluation of a possible strokelike event.  She said yesterday although cardiology note says 2 days ago she was at home and walked into the house to get something when she apparently slumped against the wall and lay on the floor.  Her husband reportedly found her unresponsive with some possible garbled speech.  Her symptoms lasted an unclear period of time and then she ultimately came to the emergency department but the wait was so long so she went home.  She said yesterday she is noticed her left eyelid drooping a little bit.  Today she went to her cardiologist's office where they heard the story and recommended that she come here for evaluation.  She notices that her balance feels off that she staggers a little bit.  No headache blurry vision double vision weakness numbness.  No prior history of same.  She is A. fib on Eliquis and does not think she is missed any doses.     The history is provided by the patient.  Cerebrovascular Accident This is a new problem. The current episode started 2 days ago. The problem has been gradually improving. Pertinent negatives include no chest pain, no abdominal pain, no headaches and no shortness of breath. Nothing aggravates the symptoms. Nothing relieves the symptoms. She has tried rest for the symptoms. The treatment provided moderate relief.    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 Dx 04/2015 at time of stroke. b. DCCV 06/2015 - did not hold. Amio started then stopped due to QT prolongation.   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   History of CVA (cerebrovascular accident) 05/14/2016   Abnormal PFT 10/24/2015   Persistent atrial fibrillation    Chronic diastolic heart failure ([place]) 06/17/2015   Aortic regurgitation 06/17/2015   Essential hypertension 06/17/2015   CVA (cerebral vascular accident) ([place]) 04/30/2015   CVA (cerebral infarction)  Pulmonary edema 04/27/2015   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 08/15/2009   Hyperlipidemia 06/10/2009   Diverticulosis of large intestine 06/10/2009   GLAUCOMA, BILATERAL  02/07/2009   Cough variant asthma 06/27/2007   Osteopenia 05/28/2007   STRICTURE AND STENOSIS OF CERVIX 05/06/2007   POSTMENOPAUSAL STATUS 05/06/2007   Chronic cough 01/21/2007   LUNG CANCER, HX OF 07/29/2006    Past Surgical History:  Procedure Laterality Date   BREAST SURGERY     CARDIOVERSION N/A 06/24/2015   Procedure: CARDIOVERSION;  Surgeon: Sueanne Margarita, MD;  Location: [place];  Service: Cardiovascular;  Laterality: N/A;   CARDIOVERSION N/A 05/19/2016   Procedure: CARDIOVERSION;  Surgeon: Sueanne Margarita, MD;  Location: [place];  Service: Cardiovascular;  Laterality: N/A;   COLONOSCOPY     LUNG CANCER SURGERY  2003   resection carcinoid lingula-lt upper lobe   RADIOACTIVE SEED GUIDED PARTIAL MASTECTOMY WITH AXILLARY SENTINEL LYMPH NODE BIOPSY Left 06/26/2014   Procedure: RADIOACTIVE SEED GUIDED PARTIAL MASTECTOMY WITH AXILLARY SENTINEL LYMPH NODE BIOPSY;  Surgeon: Stark Klein, MD;  Location: [place];  Service: General;  Laterality: Left;   RE-EXCISION OF BREAST LUMPECTOMY Left 08/07/2014   Procedure: RE-EXCISION OF LEFT BREAST LUMPECTOMY;  Surgeon: Stark Klein, MD;  Location: [place];  Service: General;  Laterality: Left;   RIGHT HEART CATH N/A 04/27/2016   Procedure: Right Heart Cath;  Surgeon: Larey Dresser, MD;  Location: [facility];  Service: Cardiovascular;  Laterality: N/A;     OB History   No obstetric history on file.      Home Medications    Prior to Admission medications   Medication Sig Start Date End Date Taking? Authorizing Provider  albuterol (PROVENTIL HFA;VENTOLIN HFA) 108 (90 Base) MCG/ACT inhaler Inhale 2 puffs into the lungs every 6 (six) hours as needed for wheezing or shortness of breath.    [provider]  anastrozole (ARIMIDEX) 1 MG tablet Take 1 tablet (1 mg total) by mouth daily. 10/20/17   Magrinat, Virgie Dad, MD  ARMOUR THYROID 60 MG tablet TAKE 1 TABLET DAILY BEFORE  BREAKFAST 02/24/18   Opalski, Neoma Laming, DO  atorvastatin (LIPITOR) 40 MG tablet TAKE 1 TABLET DAILY 07/11/18   Sueanne Margarita, MD  calcium-vitamin D (OSCAL WITH D) 500-200 MG-UNIT tablet Take 1 tablet by mouth daily with breakfast. 11/03/17   Opalski, Neoma Laming, DO  digoxin (LANOXIN) 0.125 MG tablet TAKE 1 TABLET DAILY (NEED TO CALL AND MAKE APPOINTMENT BY APRIL FOR FURTHER REFILLS) 07/27/18   Eileen Stanford, PA-C  diltiazem (CARDIZEM CD) 180 MG 24 hr capsule TAKE 1 CAPSULE DAILY (PLEASE MAKE OVERDUE APPOINTMENT WITH DR. Radford Pax BEFORE ANYMORE REFILLS [phone]) 11/14/18   Sueanne Margarita, MD  ELIQUIS 5 MG TABS tablet TAKE 1 TABLET TWICE A DAY 11/22/17   Sueanne Margarita, MD  fluticasone (FLONASE) 50 MCG/ACT nasal spray 1 spray each nostril following sinus rinses twice daily 02/08/18   Mellody Dance, DO  fluticasone-salmeterol (ADVAIR HFA) 230-21 MCG/ACT inhaler USE 2 INHALATIONS TWICE A DAY 12/31/17   Opalski, Deborah, DO  furosemide (LASIX) 20 MG tablet TAKE 2 TABLETS EVERY MORNING AND 1 TABLET  EVERY EVENING (NEED APPOINTMENT, 2ND ATTEMPT) 10/31/18   Sueanne Margarita, MD  gabapentin (NEURONTIN) 400 MG capsule TAKE 1 CAPSULE TWICE A DAY 09/01/18   Opalski, Deborah, DO  Multiple Vitamins-Iron (MULTIVITAMIN/IRON) TABS Take 1 tablet by mouth daily.     [provider]  omeprazole (PRILOSEC) 20 MG capsule TAKE 1 CAPSULE TWICE A DAY 30 MINUTES BEFORE MEALS (BEFORE LUNCH AND DINNER) 01/10/18   Opalski, Neoma Laming, DO  potassium chloride SA (K-DUR,KLOR-CON) 20 MEQ tablet Take 1 tablet by mouth twice a day 01/24/18   Sueanne Margarita, MD  Vitamin D, Ergocalciferol, (DRISDOL) 1.25 MG (50000 UT) CAPS capsule TAKE 1 CAPSULE EVERY [facility] AND SUNDAY 03/28/18   Mellody Dance, DO    Family History Family History  Problem Relation Age of Onset   Stroke Mother    Hypertension Mother    Hyperlipidemia Mother    Stroke Father    Transient ischemic attack Father    Hyperlipidemia Father     Hypertension Father    Atrial fibrillation Son    Heart attack Neg Hx     Social History Social History   Tobacco Use   Smoking status: Former Smoker    Packs/day: 0.75    Years: 5.00    Pack years: 3.75    Quit date: 06/14/1961    Years since quitting: 57.5   Smokeless tobacco: Never Used  Substance Use Topics   Alcohol use: Yes    Alcohol/week: 0.0 standard drinks    Comment: occasional wine   Drug use: No     Allergies   Combigan [brimonidine tartrate-timolol], Other, Sulfa antibiotics, and Sulfamethoxazole-trimethoprim   Review of Systems Review of Systems  Constitutional: Negative for fever.  HENT: Negative for sore throat.   Eyes: Negative for visual disturbance.  Respiratory: Negative for shortness of breath.   Cardiovascular: Negative for chest pain.  Gastrointestinal: Negative for abdominal pain.  Genitourinary: Negative for dysuria.  Musculoskeletal: Positive for gait problem.  Skin: Negative for rash.  Neurological: Positive for speech difficulty. Negative for headaches.     Physical Exam Updated Vital Signs BP 133/69 (BP Location: Right Arm)    Pulse 87    Temp 98.4 F (36.9 C) (Oral)    Resp 20    Ht 5' (1.524 m)    Wt 58.6 kg    SpO2 96%    BMI 25.23 kg/m   Physical Exam Vitals signs and nursing note reviewed.  Constitutional:      General: She is not in acute distress.    Appearance: She is well-developed.  HENT:     Head: Normocephalic and atraumatic.  Eyes:     Extraocular Movements: Extraocular movements intact.     Conjunctiva/sclera: Conjunctivae normal.     Pupils: Pupils are equal, round, and reactive to light.  Neck:     Musculoskeletal: Neck supple.  Cardiovascular:     Rate and Rhythm: Normal rate. Rhythm irregular.     Pulses: Normal pulses.     Heart sounds: No murmur.  Pulmonary:     Effort: Pulmonary effort is normal. No respiratory distress.     Breath sounds: Normal breath sounds.  Abdominal:     Palpations:  Abdomen is soft.     Tenderness: There is no abdominal tenderness.  Musculoskeletal: Normal range of motion.     Right lower leg: No edema.     Left lower leg: No edema.  Skin:    General: Skin is warm and dry.  Capillary Refill: Capillary refill takes less than 2 seconds.  Neurological:     Mental Status: She is alert. She is disoriented.     Cranial Nerves: No cranial nerve deficit.     Sensory: No sensory deficit.     Motor: No weakness.     Coordination: Coordination normal.     Comments: Not sure of day of week      ED Treatments / Results  Labs (all labs ordered are listed, but only abnormal results are displayed) Labs Reviewed  PROTIME-INR - Abnormal; Notable for the following components:      Result Value   Prothrombin Time 19.9 (*)    INR 1.7 (*)    All other components within normal limits  APTT - Abnormal; Notable for the following components:   aPTT 39 (*)    All other components within normal limits  CBC - Abnormal; Notable for the following components:   WBC 12.7 (*)    All other components within normal limits  DIFFERENTIAL - Abnormal; Notable for the following components:   Neutro Abs 10.1 (*)    Monocytes Absolute 1.1 (*)    All other components within normal limits  COMPREHENSIVE METABOLIC PANEL - Abnormal; Notable for the following components:   Glucose, Bld 134 (*)    Creatinine, Ser 1.14 (*)    Albumin 3.4 (*)    Total Bilirubin 1.7 (*)    GFR calc non Af Amer 46 (*)    GFR calc Af Amer 53 (*)    All other components within normal limits  URINALYSIS, ROUTINE W REFLEX MICROSCOPIC - Abnormal; Notable for the following components:   Leukocytes,Ua LARGE (*)    Bacteria, UA RARE (*)    All other components within normal limits  SARS CORONAVIRUS 2 (TAT 6-24 HRS)  RAPID URINE DRUG SCREEN, HOSP PERFORMED  ETHANOL  TROPONIN I (HIGH SENSITIVITY)  TROPONIN I (HIGH SENSITIVITY)    EKG None  Radiology Ct Angio Head W Or Wo Contrast  Result  Date: 11/30/2018 CLINICAL DATA:  Ataxia, stroke suspected EXAM: CT ANGIOGRAPHY HEAD AND NECK TECHNIQUE: Multidetector CT imaging of the head and neck was performed using the standard protocol during bolus administration of intravenous contrast. Multiplanar CT image reconstructions and MIPs were obtained to evaluate the vascular anatomy. Carotid stenosis measurements (when applicable) are obtained utilizing NASCET criteria, using the distal internal carotid diameter as the denominator. CONTRAST:  80 mL OMNIPAQUE IOHEXOL 350 MG/ML SOLN COMPARISON:  Brain MRI 11/30/2018, CT angiogram head/neck 04/29/2015 FINDINGS: CT HEAD FINDINGS No evidence of acute intracranial hemorrhage. No evidence of acute demarcated cortical infarction. Small chronic infarcts within the posterior right insula and right parietooccipital lobe were better appreciated on prior MRI examinations. No evidence of intracranial mass. No midline shift or extra-axial fluid collection. Mild ill-defined hypoattenuation of the cerebral white matter is nonspecific, but consistent with chronic small vessel ischemic disease. Mild generalized parenchymal atrophy. Vascular: Reported separately Skull: Normal. Negative for fracture or focal lesion. Sinuses: Mild scattered paranasal sinus mucosal thickening. Small amount of frothy secretions within posterior left ethmoid air cells. No significant mastoid effusion. Orbits: Visualized orbits demonstrate no acute abnormality. Review of the MIP images confirms the above findings CTA NECK FINDINGS Aortic arch: Standard branching. Included portions of the aortic arch demonstrate no evidence of dissection or aneurysm. Mild atherosclerotic plaque within the visualized aortic arch and proximal major branch vessels of the neck. Right carotid system: CCA and ICA patent within the neck without significant stenosis (50%  or greater). Minimal plaque within the proximal right ICA. Left carotid system: CCA and ICA patent within the  neck without significant stenosis (50% or greater). Mild soft and calcified plaque within the carotid bulb. Vertebral arteries: The bilateral vertebral arteries are patent within the neck without significant stenosis (50% or greater). Calcified plaque at the origin of the right vertebral artery results in mild ostial narrowing. Skeleton: Cervical spondylosis with multilevel posterior disc osteophytes, uncovertebral and facet hypertrophy. Moderate/severe disc height loss at C4-C5 through C6-C7. Grade 1 anterolisthesis at C2-C3, C3-C4, C7-T1. Other neck: 13 mm hypodense right thyroid lobe nodule with a small amount of associated coarse calcification, unchanged as compared to CTA head neck 04/29/2015. Upper chest: Nonspecific mosaic attenuation within the imaged lung apices (greater on the left). Review of the MIP images confirms the above findings CTA HEAD FINDINGS Anterior circulation: Calcified plaque within the bilateral carotid artery siphons. No significant stenosis within these vessels. Right middle and anterior cerebral arteries patent without significant proximal stenosis. Left middle and anterior cerebral arteries patent without significant proximal stenosis. No intracranial aneurysm identified. Posterior circulation: Intracranial vertebral arteries are patent without significant stenosis. Basilar artery is patent without significant stenosis. Bilateral posterior cerebral arteries patent without significant proximal stenosis. Fetal origin of the right posterior cerebral artery. Venous sinuses: Within limitations of contrast timing, no convincing thrombus. Anatomic variants: As described Review of the MIP images confirms the above findings IMPRESSION: CT head: 1. No CT evidence of acute intracranial abnormality. 2. Small chronic cortical infarcts within the posterior right insula and right parietooccipital lobes better appreciated on prior brain MRI. 3. Mild generalized parenchymal atrophy and chronic small  vessel ischemic disease. 4. Paranasal sinus disease as described. This includes a small amount of frothy secretions within posterior left ethmoid air cells. Correlate for acute sinusitis. CTA head: 1. No intracranial large vessel occlusion or proximal high-grade arterial stenosis. 2. Atherosclerotic calcification of the carotid artery siphons without significant stenosis within these vessels. CTA neck: 1. Common carotid, internal carotid and vertebral arteries patent within the neck bilaterally without significant stenosis (50% or greater). Mild atherosclerotic disease as described. 2. Nonspecific mosaic attenuation within the imaged lung apices (greater on the left). This may reflect edema or sequela of small airways disease. 3. Cervical spondylosis. Electronically Signed   By: Kellie Simmering   On: 11/30/2018 15:11   Ct Angio Neck W And/or Wo Contrast  Result Date: 11/30/2018 CLINICAL DATA:  Ataxia, stroke suspected EXAM: CT ANGIOGRAPHY HEAD AND NECK TECHNIQUE: Multidetector CT imaging of the head and neck was performed using the standard protocol during bolus administration of intravenous contrast. Multiplanar CT image reconstructions and MIPs were obtained to evaluate the vascular anatomy. Carotid stenosis measurements (when applicable) are obtained utilizing NASCET criteria, using the distal internal carotid diameter as the denominator. CONTRAST:  80 mL OMNIPAQUE IOHEXOL 350 MG/ML SOLN COMPARISON:  Brain MRI 11/30/2018, CT angiogram head/neck 04/29/2015 FINDINGS: CT HEAD FINDINGS No evidence of acute intracranial hemorrhage. No evidence of acute demarcated cortical infarction. Small chronic infarcts within the posterior right insula and right parietooccipital lobe were better appreciated on prior MRI examinations. No evidence of intracranial mass. No midline shift or extra-axial fluid collection. Mild ill-defined hypoattenuation of the cerebral white matter is nonspecific, but consistent with chronic small  vessel ischemic disease. Mild generalized parenchymal atrophy. Vascular: Reported separately Skull: Normal. Negative for fracture or focal lesion. Sinuses: Mild scattered paranasal sinus mucosal thickening. Small amount of frothy secretions within posterior left ethmoid air cells. No  significant mastoid effusion. Orbits: Visualized orbits demonstrate no acute abnormality. Review of the MIP images confirms the above findings CTA NECK FINDINGS Aortic arch: Standard branching. Included portions of the aortic arch demonstrate no evidence of dissection or aneurysm. Mild atherosclerotic plaque within the visualized aortic arch and proximal major branch vessels of the neck. Right carotid system: CCA and ICA patent within the neck without significant stenosis (50% or greater). Minimal plaque within the proximal right ICA. Left carotid system: CCA and ICA patent within the neck without significant stenosis (50% or greater). Mild soft and calcified plaque within the carotid bulb. Vertebral arteries: The bilateral vertebral arteries are patent within the neck without significant stenosis (50% or greater). Calcified plaque at the origin of the right vertebral artery results in mild ostial narrowing. Skeleton: Cervical spondylosis with multilevel posterior disc osteophytes, uncovertebral and facet hypertrophy. Moderate/severe disc height loss at C4-C5 through C6-C7. Grade 1 anterolisthesis at C2-C3, C3-C4, C7-T1. Other neck: 13 mm hypodense right thyroid lobe nodule with a small amount of associated coarse calcification, unchanged as compared to CTA head neck 04/29/2015. Upper chest: Nonspecific mosaic attenuation within the imaged lung apices (greater on the left). Review of the MIP images confirms the above findings CTA HEAD FINDINGS Anterior circulation: Calcified plaque within the bilateral carotid artery siphons. No significant stenosis within these vessels. Right middle and anterior cerebral arteries patent without  significant proximal stenosis. Left middle and anterior cerebral arteries patent without significant proximal stenosis. No intracranial aneurysm identified. Posterior circulation: Intracranial vertebral arteries are patent without significant stenosis. Basilar artery is patent without significant stenosis. Bilateral posterior cerebral arteries patent without significant proximal stenosis. Fetal origin of the right posterior cerebral artery. Venous sinuses: Within limitations of contrast timing, no convincing thrombus. Anatomic variants: As described Review of the MIP images confirms the above findings IMPRESSION: CT head: 1. No CT evidence of acute intracranial abnormality. 2. Small chronic cortical infarcts within the posterior right insula and right parietooccipital lobes better appreciated on prior brain MRI. 3. Mild generalized parenchymal atrophy and chronic small vessel ischemic disease. 4. Paranasal sinus disease as described. This includes a small amount of frothy secretions within posterior left ethmoid air cells. Correlate for acute sinusitis. CTA head: 1. No intracranial large vessel occlusion or proximal high-grade arterial stenosis. 2. Atherosclerotic calcification of the carotid artery siphons without significant stenosis within these vessels. CTA neck: 1. Common carotid, internal carotid and vertebral arteries patent within the neck bilaterally without significant stenosis (50% or greater). Mild atherosclerotic disease as described. 2. Nonspecific mosaic attenuation within the imaged lung apices (greater on the left). This may reflect edema or sequela of small airways disease. 3. Cervical spondylosis. Electronically Signed   By: Kellie Simmering   On: 11/30/2018 15:11   [name] Contrast  Result Date: 11/30/2018 CLINICAL DATA:  Initial evaluation for acute ataxia, stroke suspected. EXAM: MRI HEAD WITHOUT CONTRAST TECHNIQUE: Multiplanar, multiecho pulse sequences of the brain and surrounding  structures were obtained without intravenous contrast. COMPARISON:  Prior MRI from 04/28/2015. FINDINGS: Brain: Examination moderately degraded by motion artifact. Generalized age-related cerebral atrophy with mild chronic small vessel ischemic disease. Small remote cortical infarct noted involving the right temporal occipital region. Small amount of chronic hemosiderin staining. No abnormal foci of restricted diffusion to suggest acute or subacute ischemia. Gray-white matter differentiation maintained. No evidence for acute intracranial hemorrhage. No mass lesion, midline shift or mass effect. No hydrocephalus. No extra-axial fluid collection. Pituitary gland and suprasellar region normal. Midline structures intact.  Vascular: Major intracranial vascular flow voids are maintained. Skull and upper cervical spine: Craniocervical junction within normal limits. Upper cervical spine normal. Bone marrow signal intensity within normal limits. No scalp soft tissue abnormality. Sinuses/Orbits: Globes and orbital soft tissues within normal limits. Paranasal sinuses are largely clear. Trace left mastoid effusion, of doubtful significance. Inner ear structures grossly normal. Other: None. IMPRESSION: 1. No acute intracranial infarct or other process identified. 2. Small remote right temporal occipital cortical infarct. 3. Generalized age-related cerebral atrophy with mild chronic small vessel ischemic disease. Electronically Signed   By: Jeannine Boga M.D.   On: 11/30/2018 14:09    Procedures Procedures (including critical care time)  Medications Ordered in ED Medications  iohexol (OMNIPAQUE) 350 MG/ML injection 80 mL (100 mLs Intravenous Contrast Given 11/30/18 1402)     Initial Impression / Assessment and Plan / ED Course  I have reviewed the triage vital signs and the nursing notes.  Pertinent labs & imaging results that were available during my care of the patient were reviewed by me and considered in  my medical decision making (see chart for details).  Clinical Course as of Nov 30 1918  Wed Nov 30, 3275  [id number] 78 year old female A. fib on Eliquis here after a possible unresponsive episode 2 days ago with some speech difficulty.  That seems to have resolved although she still feels like her gait is off.  Differential includes TIA, stroke, bleed, metabolic derangement.  Checking labs EKG head CT and neurology consult.   [MB]  1108 Discussed with Dr. Leonel Ramsay from neurology.  He is recommending to go straight to MRI noncontrast brain instead of a head CT first.  Will follow along.   [MB]  1110 EKG shows her to be in A. fib at a rate of 91, nonspecific ST depressions inferiorly and laterally.  Similar to prior EKG 9/20   [MB]  1234 Neuro has evaluated the patient other asking for CTA head and neck along with her MRI.  Working differential is stroke versus seizure.  Likely will need admission   [MB]  1538 Patient has an EEG ongoing right now.   [MB]  6948 MRI not crossing over into epic but it sounds like no acute findings, comment about an old small infarct.   [MB]  5462 Neuro feels ok with patient being discharged and following up with outpatient neuro.    [MB]    Clinical Course User Index [MB] Hayden Rasmussen, MD        Final Clinical Impressions(s) / ED Diagnoses   Final diagnoses:  Unresponsive episode    ED Discharge Orders    None       Hayden Rasmussen, MD 11/30/18 1921

## 2018-11-30 NOTE — Progress Notes (Signed)
EEG complete - results pending 

## 2018-11-30 NOTE — ED Notes (Signed)
EEG tech at bedside. EEG in process

## 2018-11-30 NOTE — ED Notes (Signed)
EEG completed, swallow screen performed and patient passed

## 2018-11-30 NOTE — Progress Notes (Signed)
Cardiology Office Note    Date:  11/30/2018   ID:  Patricia [name], DOB December 26, 1940, MRN [id number]  PCP:  Mellody Dance, DO  Cardiologist: Fransico Him, MD EPS: None  Chief Complaint  Patient presents with  . Hospitalization Follow-up    History of Present Illness:  Patricia [name] is a 78 y.o. female with history of persistent AFib on Eliquis, CVA, amiodarone stopped because of prolonged QT, chronic diastolic CHF, mild-mod AI,normal NST 2017, lung CA resection 2003, breast CA s/p lumpectomy/Tamoxifen/XRT, HTN, HLD. RHC 04/2016 borderline pulmonary HTN.  LOV Dr. Radford Pax 11/2016, Dr. Curt Bears 05/2017 and opted for rate control.  11/29/18 patient was found slumped against a wall with slurred speech and weakness. Symptoms resolved spontaneously. Went to ER but left triage because of wait time. Labs stable and EKG unchanged.  Patient went into house to get checkbook. Husband found her laying on the floor slumped against the wall. She was awake but incoherent and wouldn't move. She was back to normal within 5 min.EMS blood sugar normal, vitals normal and stroke eval normal. Yesterday husband noticed her left eye was droopy but it's fine today. Also more fatigued.She feels off balance. Can't tell me who the president is or senator of [place]. Denies dizziness, or any prodrome. No loss of bowel or bladder funciton. No history of syncope, seizure. Hasn't missed any Eliquis doses.     Past Medical History:  Diagnosis Date  . Allergic rhinitis, cause unspecified   . Aortic regurgitation 06/17/2015   Mild to moderate AR by echo 04/2015  . 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 Chronic cough   . Chronic diastolic heart failure ([place]) 06/17/2015  . CKD (chronic kidney disease), stage III ([place])   . Cough variant asthma   . Disease of pharynx or nasopharynx   . Diverticulosis   . Essential hypertension   . GERD (gastroesophageal reflux disease)   .  Glaucoma   . Hiatal hernia   . Hyperlipidemia   . Internal hemorrhoids   . Laryngospasm   . Multinodular goiter   . Osteopenia   . Persistent atrial fibrillation    a. Dx 04/2015 at time of stroke. b. DCCV 06/2015 - did not hold. Amio started then stopped due to QT prolongation.  Marland Kitchen Postmenopausal   . Radiation 10/22/14-11/23/14   Left Breast  . Stricture and stenosis of cervix     Past Surgical History:  Procedure Laterality Date  . BREAST SURGERY    . CARDIOVERSION N/A 06/24/2015   Procedure: CARDIOVERSION;  Surgeon: Sueanne Margarita, MD;  Location: [place];  Service: Cardiovascular;  Laterality: N/A;  . CARDIOVERSION N/A 05/19/2016   Procedure: CARDIOVERSION;  Surgeon: Sueanne Margarita, MD;  Location[facility];  Service: Cardiovascular;  Laterality: N/A;  . COLONOSCOPY    . LUNG CANCER SURGERY  2003   resection carcinoid lingula-lt upper lobe  . RADIOACTIVE SEED GUIDED PARTIAL MASTECTOMY WITH AXILLARY SENTINEL LYMPH NODE BIOPSY Left 06/26/2014   Procedure: RADIOACTIVE SEED GUIDED PARTIAL MASTECTOMY WITH AXILLARY SENTINEL LYMPH NODE BIOPSY;  Surgeon: Stark Klein, MD;  Location: [facility];  Service: General;  Laterality: Left;  . RE-EXCISION OF BREAST LUMPECTOMY Left 08/07/2014   Procedure: RE-EXCISION OF LEFT BREAST LUMPECTOMY;  Surgeon: Stark Klein, MD;  Location: [facility];  Service: General;  Laterality: Left;  . RIGHT HEART CATH N/A 04/27/2016   Procedure: Right Heart  Cath;  Surgeon: Larey Dresser, MD;  Location: [facility];  Service: Cardiovascular;  Laterality: N/A;    Current Medications: Current Meds  Medication Sig  . albuterol (PROVENTIL HFA;VENTOLIN HFA) 108 (90 Base) MCG/ACT inhaler Inhale 2 puffs into the lungs every 6 (six) hours as needed for wheezing or shortness of breath.  . anastrozole (ARIMIDEX) 1 MG tablet Take 1 tablet (1 mg total) by mouth daily.  Francia Greaves THYROID 60 MG tablet TAKE 1 TABLET DAILY BEFORE BREAKFAST  .  atorvastatin (LIPITOR) 40 MG tablet TAKE 1 TABLET DAILY  . calcium-vitamin D (OSCAL WITH D) 500-200 MG-UNIT tablet Take 1 tablet by mouth daily with breakfast.  . digoxin (LANOXIN) 0.125 MG tablet TAKE 1 TABLET DAILY (NEED TO CALL AND MAKE APPOINTMENT BY APRIL FOR FURTHER REFILLS)  . diltiazem (CARDIZEM CD) 180 MG 24 hr capsule TAKE 1 CAPSULE DAILY (PLEASE MAKE OVERDUE APPOINTMENT WITH DR. Radford Pax BEFORE ANYMORE REFILLS [phone])  . ELIQUIS 5 MG TABS tablet TAKE 1 TABLET TWICE A DAY  . fluticasone (FLONASE) 50 MCG/ACT nasal spray 1 spray each nostril following sinus rinses twice daily  . fluticasone-salmeterol (ADVAIR HFA) 230-21 MCG/ACT inhaler USE 2 INHALATIONS TWICE A DAY  . furosemide (LASIX) 20 MG tablet TAKE 2 TABLETS EVERY MORNING AND 1 TABLET EVERY EVENING (NEED APPOINTMENT, 2ND ATTEMPT)  . gabapentin (NEURONTIN) 400 MG capsule TAKE 1 CAPSULE TWICE A DAY  . Multiple Vitamins-Iron (MULTIVITAMIN/IRON) TABS Take 1 tablet by mouth daily.   Marland Kitchen omeprazole (PRILOSEC) 20 MG capsule TAKE 1 CAPSULE TWICE A DAY 30 MINUTES BEFORE MEALS (BEFORE LUNCH AND DINNER)  . potassium chloride SA (K-DUR,KLOR-CON) 20 MEQ tablet Take 1 tablet by mouth twice a day  . Vitamin D, Ergocalciferol, (DRISDOL) 1.25 MG (50000 UT) CAPS capsule TAKE 1 CAPSULE EVERY WEDNESDAY AND SUNDAY     Allergies:   Combigan [brimonidine tartrate-timolol], Other, Sulfa antibiotics, and Sulfamethoxazole-trimethoprim   Social History   Socioeconomic History  . Marital status: Married    Spouse name: Not on file  . Number of children: Not on file  . Years of education: Not on file  . Highest education level: Not on file  Occupational History  . Not on file  Social Needs  . Financial resource strain: Not on file  . Food insecurity    Worry: Not on file    Inability: Not on file  . Transportation needs    Medical: Not on file    Non-medical: Not on file  Tobacco Use  . Smoking status: Former Smoker    Packs/day: 0.75     Years: 5.00    Pack years: 3.75    Quit date: 06/14/1961    Years since quitting: 57.5  . Smokeless tobacco: Never Used  Substance and Sexual Activity  . Alcohol use: Yes    Alcohol/week: 0.0 standard drinks    Comment: occasional wine  . Drug use: No  . Sexual activity: Not Currently  Lifestyle  . Physical activity    Days per week: Not on file    Minutes per session: Not on file  . Stress: Not on file  Relationships  . Social Herbalist on phone: Not on file    Gets together: Not on file    Attends religious service: Not on file    Active member of club or organization: Not on file    Attends meetings of clubs or organizations: Not on file    Relationship status: Not on file  Other Topics Concern  . Not on file  Social History Narrative   Retired. Lives with husband.      Family History:  The patient's   family history includes Atrial fibrillation in her son; Hyperlipidemia in her father and mother; Hypertension in her father and mother; Stroke in her father and mother; Transient ischemic attack in her father.   ROS:   Please see the history of present illness.    Review of Systems  Constitution: Negative.  HENT: Negative.   Eyes: Negative.   Cardiovascular: Negative.   Respiratory: Negative.   Hematologic/Lymphatic: Negative.   Musculoskeletal: Negative.  Negative for joint pain.  Gastrointestinal: Negative.   Genitourinary: Negative.   Neurological: Positive for difficulty with concentration, loss of balance and weakness.  Psychiatric/Behavioral: Positive for memory loss.   All other systems reviewed and are negative.   PHYSICAL EXAM:   VS:  BP 116/60   Pulse 70   Ht 5' (1.524 m)   Wt 129 lb 1.9 oz (58.6 kg)   SpO2 97%   BMI 25.22 kg/m   Physical Exam  GEN: Well nourished, well developed, in no acute distress  HEENT: normal  Neck: no JVD, carotid bruits, or masses Cardiac:RRR; no murmurs, rubs, or gallops  Respiratory:  clear to auscultation  bilaterally, normal work of breathing GI: soft, nontender, nondistended, + BS Ext: without cyanosis, clubbing, or edema, Good distal pulses bilaterally MS: no deformity or atrophy  Skin: warm and dry, no rash Neuro:  Alert and Oriented x 2, Strength and sensation are intact Off balance, can't tell me who [name] president is or [place] senator Psych: euthymic mood, full affect  Wt Readings from Last 3 Encounters:  11/30/18 129 lb 1.9 oz (58.6 kg)  10/31/18 132 lb 1.6 oz (59.9 kg)  08/15/18 131 lb (59.4 kg)      Studies/Labs Reviewed:   EKG:  EKG is not ordered today.  The ekg reviewed from EF 11/28/18 Afib with old septal MI and inf/lat ST changes similar to 05/2017  Recent Labs: 10/31/2018: ALT 36 11/28/2018: BUN 15; Creatinine, Ser 1.02; Hemoglobin 16.0; Platelets 359; Potassium 3.7; Sodium 136   Lipid Panel    Component Value Date/Time   CHOL 145 11/05/2017 0842   TRIG 123 11/05/2017 0842   TRIG 52 01/13/2006 1012   HDL 54 11/05/2017 0842   CHOLHDL 2.7 11/05/2017 0842   CHOLHDL 3 04/01/2016 0952   VLDL 19.2 04/01/2016 0952   LDLCALC 66 11/05/2017 0842   LDLDIRECT 174.3 06/04/2009 0000    Additional studies/ records that were reviewed today include:   Echo 2/8/18Study Conclusions   - Left ventricle: The cavity size was normal. Wall thickness was   normal. Systolic function was normal. The estimated ejection   fraction was in the range of 55% to 60%. Wall motion was normal;   there were no regional wall motion abnormalities. Features are   consistent with a pseudonormal left ventricular filling pattern,   with concomitant abnormal relaxation and increased filling   pressure (grade 2 diastolic dysfunction). Doppler parameters are   consistent with high ventricular filling pressure. - Aortic valve: There was mild regurgitation. - Mitral valve: Calcified annulus. There was mild regurgitation. - Left atrium: The atrium was severely dilated. - Tricuspid valve: There was moderate  regurgitation. - Pulmonary arteries: Systolic pressure was moderately increased.   PA peak pressure: 52 mm Hg (S).   Impressions:   - Normal LV systolic function; grade 2 diastolic dysfunction;   elevated  LV filling pressure; mild AI; mild MR; severe LAE;   moderate TR with moderately elevated pulmonary pressure.  [place] 2/20181. Mildly elevated right and left heart filling pressures.  2. Borderline pulmonary hypertension.    Overall, hemodynamics look ok. I do not think she has significant pulmonary hypertension.  However, she is in atrial fibrillation with RVR today.  She is not on rate control meds at home.  I will start diltiazem CD 180 mg daily, giving dose now.  She will restart Eliquis this evening.  I will have her followup in atrial fibrillation clinic.  If she does not revert to NSR, would consider DCCV.  She is, of note, not particularly symptomatic (did not know that she was back in atrial fibrillation    ASSESSMENT:    1. Suspected cerebrovascular accident (CVA)   2. Persistent atrial fibrillation   3. Essential hypertension   4. Chronic diastolic heart failure ([place])   5. Aortic valve insufficiency, etiology of cardiac valve disease unspecified   6. Hyperlipidemia, unspecified hyperlipidemia type   7. History of CVA (cerebrovascular accident)   8. CKD (chronic kidney disease), stage III ([place])      PLAN:  In order of problems listed above:  Suspected CVA-patient found slumped on the floor disoriented 2 days ago went to ER but wait was too long so left before seen. She had eye drooping yest, normal today, off balance, can't tell me who president is. Is in Afib rate controlled, hasn't missed eliquis dose. I spoke with Dr.Dohmeier who recommends inpatient workup as this is only 8 days old and will facilitate treatment. She will alert her team at [facility] so the patient is seen quickly and avoid wait in ER. Patient and husband agreeable.  History of CVA 2017 in setting of Afib.  Suspect another CVA 2 days ago. Recent memory problems as well  Paroxysmal Afib on Eliquis  Essential HTN BP compensated  Chronic diastolic CHF compensated  Aortic regurgitation last echo 2018  HLD LDL 66 10/2017  CKD stage 3 Crt 1.02-9/28/20     Medication Adjustments/Labs and Tests Ordered: Current medicines are reviewed at length with the patient today.  Concerns regarding medicines are outlined above.  Medication changes, Labs and Tests ordered today are listed in the Patient Instructions below. Patient Instructions  YOU ARE BEING SENT TO THE EMERGENCY ROOM    Signed, Ermalinda Barrios, PA-C  11/30/2018 10:16 AM    [facility] [address], [place], [place]  [address] Phone: [phone]; Fax: [phone]

## 2018-12-03 ENCOUNTER — Other Ambulatory Visit: Payer: Self-pay | Admitting: Cardiology

## 2018-12-05 ENCOUNTER — Encounter: Payer: Self-pay | Admitting: Family Medicine

## 2018-12-05 ENCOUNTER — Other Ambulatory Visit: Payer: Self-pay

## 2018-12-05 ENCOUNTER — Ambulatory Visit (INDEPENDENT_AMBULATORY_CARE_PROVIDER_SITE_OTHER): Payer: Medicare Other | Admitting: Family Medicine

## 2018-12-05 VITALS — BP 121/81 | HR 82 | Ht 60.0 in | Wt 132.0 lb

## 2018-12-05 DIAGNOSIS — R413 Other amnesia: Secondary | ICD-10-CM | POA: Diagnosis not present

## 2018-12-05 DIAGNOSIS — R0989 Other specified symptoms and signs involving the circulatory and respiratory systems: Secondary | ICD-10-CM

## 2018-12-05 DIAGNOSIS — Z09 Encounter for follow-up examination after completed treatment for conditions other than malignant neoplasm: Secondary | ICD-10-CM | POA: Diagnosis not present

## 2018-12-05 DIAGNOSIS — R4182 Altered mental status, unspecified: Secondary | ICD-10-CM | POA: Diagnosis not present

## 2018-12-05 DIAGNOSIS — D72829 Elevated white blood cell count, unspecified: Secondary | ICD-10-CM | POA: Diagnosis not present

## 2018-12-05 DIAGNOSIS — R8271 Bacteriuria: Secondary | ICD-10-CM | POA: Diagnosis not present

## 2018-12-05 LAB — POCT URINALYSIS DIPSTICK
Bilirubin, UA: NEGATIVE
Glucose, UA: NEGATIVE
Ketones, UA: NEGATIVE
Nitrite, UA: NEGATIVE
Protein, UA: NEGATIVE
Spec Grav, UA: 1.02 (ref 1.010–1.025)
Urobilinogen, UA: 1 E.U./dL
pH, UA: 7 (ref 5.0–8.0)

## 2018-12-05 NOTE — Telephone Encounter (Signed)
Eliquis 5mg  refill request received; pt is 78 yrs old, weight-58.6kg, Crea-1.14 on 11/30/2018, Diagnosis-Afib, and last seen by Dr. Radford Pax on 11/30/2018. Dose is appropriate based on dosing criteria. Will send in refill to requested pharmacy.

## 2018-12-05 NOTE — Patient Instructions (Addendum)
Please follow-up with your cardiology's office for your atrial fibrillation as they discussed with you recently.  -Also its extreme important you follow-up with neurology at your upcoming appointment on the 22nd with Dr. Antony Contras

## 2018-12-05 NOTE — Progress Notes (Signed)
Impression and Recommendations:    1. Hospital/ED discharge follow-up   2. Altered mental status, unspecified altered mental status type   3. Memory changes   4. Bacteria in urine   5. Leukocytosis, unspecified type     Hospital Follow-Up - Altered Mental Status - Regarding pt's recent hospitalization and/or ED visit: reviewed in great detail recent hospitalization notes, clinical lab tests, tests in the radiology section of CPT, tests in the medicine testing of CPT, and obtained history from family member. Moderate-to-significant complexity  - Recent imaging reviewed and discussed at length with patient during appointment. - Discussed that there were no acute findings in the hospital.  - Patient dismissed from stroke clinic in 9/ 2018.  - Ambulatory referral to neurology discussed for evaluation of mental status changes.  - Per patient's husband, she already has an appointment upcoming with [facility] on October 22. - Will be followed by Dr. Antony Contras.  -  Very lengthy discussion held with patient during appointment. -   In addition, discussion held with patient's husband separately and also in the room with him and patient as well.  -  Had several questions about findings in the ER and what work-up was done as well as next steps.   -  We discussed differential diagnosis of vascular dementia, cerebrovascular disease, in rare cases meningitis, possibly pyelonephritis or other infectious process etc. etc.  -   education provided and all questions answered.  - Will continue to monitor.  - Patient and husband know to continue to obtain management with other specialists as established.    - Discussed red flag symptoms with patient and husband today. - Advised return to ED if experiencing acute emergency changes as discussed   Bacteria in Urine, Leukocytosis -Upon review of all labs etc. recently drawn in hospital-urinalysis in hospital was positive for bacteria,  positive for leukocytes.  However there was no urine culture performed. -Explained to family-patient and husband that acute mental status changes can be common in older folks with UTI.  We discussed this phenomenon and, if it comes up positive for UTI, I stressed the importance of her still following up with neurology for further evaluation of memory and mental status change - Urinalysis drawn today for further evaluation-see test result.    Will be sent to culture. - Will wait on antibiotics today and treat with antibiotics ONLY if culture is positive  - Reviewed recent lab work 11/30/2018. - Patient with left shift, and white blood cell count up to 12.7.   Acute Memory Changes - Dementia panel ordered today.  See orders. - Extensive education provided and all questions answered.  - Will continue to monitor closely.   Patient in Atrial Fibrillation; Cardiac Care - Per pt husband, patient was last seen by cardiology last Tuesday. - Discussed that if tiredness/sluggish feelings continue and seem to be cardiologically related, patient or husband should contact cardiology PRN. - Will continue to monitor.   Patient was interviewed and evaluated by me/ [facility] staff members in the clinic today for 40+ minutes, with over 50% of my time spent in face to face counseling of patients various medical conditions, treatment plans of those medical conditions including medicine management and lifestyle modification, strategies to improve health and well being; and in coordination of care.   SEE TREATMENT PLAN FOR DETAILS    Orders Placed This Encounter  Procedures  . CULTURE, URINE COMPREHENSIVE  . RPR  . Vitamin B12  .  Sedimentation rate  . CBC  . TSH  . Folate  . BASIC METABOLIC PANEL WITH GFR  . POCT urinalysis dipstick    Gross side effects, risk and benefits, and alternatives of medications and treatment plan in general discussed with patient.  Patient is aware that all medications have  potential side effects and we are unable to predict every side effect or drug-drug interaction that may occur.   Patient will call with any questions prior to using medication if they have concerns.    Expresses verbal understanding and consents to current therapy and treatment regimen.  No barriers to understanding were identified.  Red flag symptoms and signs discussed in detail.  Patient expressed understanding regarding what to do in case of emergency\urgent symptoms  Please see AVS handed out to patient at the end of our visit for further patient instructions/ counseling done pertaining to today's office visit.   Return if symptoms worsen or fail to improve- go to ED for acute mental status changes/ chest pain, SOB etc, for f/up with specialists and with me in 2-61mo or sooner if issues.     Note:  This note was prepared with assistance of Dragon voice recognition software. Occasional wrong-word or sound-a-like substitutions may have occurred due to the inherent limitations of voice recognition software.  Patient was interviewed and evaluated by me/ [facility] staff members in the clinic today for 40+ minutes, with over 50% of my time spent in face to face counseling of patients various medical conditions, treatment plans of those medical conditions including medicine management and lifestyle modification, strategies to improve health and well being; and in coordination of care.   SEE TREATMENT PLAN FOR DETAILS  This document serves as a record of services personally performed by Mellody Dance, DO. It was created on her behalf by [name], a trained medical scribe. The creation of this record is based on the scribe's personal observations and the provider's statements to them.   I have reviewed the above medical documentation for accuracy and completeness and I concur.  Mellody Dance, DO 12/05/2018 5:49 PM         --------------------------------------------------------------------------------------------------------------------------------------------------------------------------------------------------------------------------------------------    Subjective:     HPI: Patricia [name] is a 78 y.o. female who presents to [place] at [facility] today for issues as discussed below.  Last seen in June for chronic follow-up.  Notes she's doing okay, but says "I've just been not as well as last year; I have to say that."  Says "I don't seem as interested in the things as I was last year."  Says "I don't know.  I'm not nervous and worried about this COVID, but I might be more nervous and worried than I think I am.  Sometimes I can't find the words I want."  Says "I forget what I'm doing; I can go downstairs to get something from the freezer and then wonder 'what did I come here for.'"  Says most of these developments are new.  Says it's happened between the last month and three months.  States this visit "was supposed to be just a routine yearly checkup," but was recently in the hospital.  Confirms that her memory has been off and "this is what my husband is concerned about."  - [place]; Acute Mental Status Change Was recently seen for evaluation for stroke-like event.  They went to the ER initially on Monday; followed up with cardiology on Tuesday, and seen again in the hospital on Wednesday.  Notes cardiology told the patient that she was in afib at that time.   Notes "walking into house to get something when she slumped on the wall and laid down on floor."  Husband found her unresponsive after this event with possible garbled speech.  Husband was out on the porch and they were talking about power washing the house and painting it; "he sent me in for the checkbook."  States to get to the check book, she moved a black chair with wheels, and this motion made her dizzy and she "slid  down the wall and sat there."  Notes husband came and saw her laying on the floor "and he couldn't get a coherent answer out of me," and then he called 911.  Confirms using a once-weekly pillbox.  Does not think that there was a chance of her taking an unusual medication.  - Interview with Husband Husband thinks her recent memory loss started about three months ago.  Says "It's getting worse and worse now."  Denies incident or event three months ago that he can remember.  Says the thought the recent event this past Monday was a stroke.  Notes "when paramedics came in with the ambulance, they did a stroke test," and confirms she was negative for this.  Husband states patient has an appointment coming up on October 22 with the stroke department through [facility].  Notes she hasn't had much energy lately.  States "Friday she had pretty good energy, but Saturday, Sunday, and today, she hasn't."  States back in 2017, she felt poorly for a week and they were told at Urgent Care that she was in atrial fibrillation.  Husband states that her current tiredness is similar to this.   Wt Readings from Last 3 Encounters:  12/05/18 132 lb (59.9 kg)  11/30/18 129 lb 3 oz (58.6 kg)  11/30/18 129 lb 1.9 oz (58.6 kg)   BP Readings from Last 3 Encounters:  12/05/18 121/81  11/30/18 125/75  11/30/18 116/60   Pulse Readings from Last 3 Encounters:  12/05/18 82  11/30/18 96  11/30/18 70   BMI Readings from Last 3 Encounters:  12/05/18 25.78 kg/m  11/30/18 25.23 kg/m  11/30/18 25.22 kg/m   Urinalysis    Component Value Date/Time   COLORURINE YELLOW 11/30/2018 1104   APPEARANCEUR CLEAR 11/30/2018 1104   LABSPEC 1.012 11/30/2018 1104   PHURINE 6.0 11/30/2018 [place] 11/30/2018 [place] 11/30/2018 1104   HGBUR negative 05/20/2007 0942   BILIRUBINUR negative 12/05/2018 [place] 11/30/2018 1104   PROTEINUR Negative 12/05/2018 1540    PROTEINUR NEGATIVE 11/30/2018 1104   UROBILINOGEN 1.0 12/05/2018 1540   UROBILINOGEN 0.2 05/20/2007 0942   NITRITE negative 12/05/2018 1540   NITRITE NEGATIVE 11/30/2018 1104   LEUKOCYTESUR Small (1+) (A) 12/05/2018 1540   LEUKOCYTESUR LARGE (A) 11/30/2018 1104      Patient Care Team    Relationship Specialty Notifications Start End  Mellody Dance, DO PCP - General Family Medicine  09/08/16   Sueanne Margarita, MD PCP - Cardiology Cardiology Admissions 11/30/18   Constance Haw, MD PCP - Electrophysiology Cardiology Admissions 11/30/18   Thea Silversmith, MD Consulting Physician Radiation Oncology  11/29/14   Magrinat, Virgie Dad, MD Consulting Physician Oncology  11/29/14   Stark Klein, MD Consulting Physician General Surgery  11/29/14   Sylvan Cheese, NP Nurse Practitioner Nurse Practitioner  11/29/14    Comment: Survivorship  Ladene Artist,  MD Consulting Physician Gastroenterology  02/14/15   Sueanne Margarita, MD Consulting Physician Cardiology  09/08/16   Marygrace Drought, MD Consulting Physician Ophthalmology  01/06/17    Comment: Sees him for glaucoma  Garvin Fila, MD Consulting Physician Neurology  12/05/18      Patient Active Problem List   Diagnosis Date Noted  . Prediabetes 01/06/2017    Priority: High  . CKD (chronic kidney disease), stage III 10/30/2016    Priority: High  . Persistent atrial fibrillation (HCC)     Priority: High  . Chronic diastolic heart failure ([place]) 06/17/2015    Priority: High  . Essential hypertension 06/17/2015    Priority: High  . CVA (cerebral vascular accident) ([place]) 04/30/2015    Priority: High  . Hyperlipidemia 06/10/2009    Priority: High  . Lung cancer, lingula ([place]) 09/08/2016    Priority: Medium  . 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 Malignant neoplasm of upper-outer quadrant of left breast in female, estrogen receptor positive ([place]) 06/08/2013    Priority: Medium  . Multinodular  goiter 01/11/2013    Priority: Medium  . 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 ESOPHAGEAL STRICTURE 09/26/2009    Priority: Medium  . GERD 09/26/2009    Priority: Medium  . Cough variant asthma 06/27/2007    Priority: Medium  . Chronic cough 01/21/2007    Priority: Medium  . LUNG CANCER, HX OF 07/29/2006    Priority: Medium  . Arthritis 01/27/2017    Priority: Low  . Vitamin D deficiency 01/06/2017    Priority: Low  . Postmenopausal bone loss 01/06/2017    Priority: Low  . Osteopenia 05/28/2007    Priority: Low  . Leukocytosis 12/05/2018  . Bacteria in urine 12/05/2018  . Altered mental status 12/05/2018  . Cerebrovascular accident ([place]) 09/27/2017  . Memory changes 09/27/2017  . Hypocalcemia 05/27/2017  . h/o Radiation 04/14/2017  . High risk medication use 04/14/2017  . History of CVA (cerebrovascular accident) 05/14/2016  . Abnormal PFT 10/24/2015  . Aortic regurgitation 06/17/2015  . CVA (cerebral infarction)   . Pulmonary edema 04/27/2015  . General medical examination 06/09/2010  . Screening for malignant neoplasm of the cervix 06/09/2010  . HEARING LOSS, UNSPEC. 08/15/2009  . Diverticulosis of large intestine 06/10/2009  . GLAUCOMA, BILATERAL 02/07/2009  . STRICTURE AND STENOSIS OF CERVIX 05/06/2007  . POSTMENOPAUSAL STATUS 05/06/2007    Past Medical history, Surgical history, Family history, Social history, Allergies and Medications have been entered into the medical record, reviewed and changed as needed.    Current Meds  Medication Sig  . albuterol (PROVENTIL HFA;VENTOLIN HFA) 108 (90 Base) MCG/ACT inhaler Inhale 2 puffs into the lungs every 6 (six) hours as needed for wheezing or shortness of breath.  . anastrozole (ARIMIDEX) 1 MG tablet Take 1 tablet (1 mg total) by mouth daily.  Francia Greaves THYROID 60 MG tablet TAKE 1 TABLET DAILY BEFORE BREAKFAST  . atorvastatin (LIPITOR) 40 MG tablet TAKE 1 TABLET DAILY  . calcium-vitamin D (OSCAL WITH D)  500-200 MG-UNIT tablet Take 1 tablet by mouth daily with breakfast.  . digoxin (LANOXIN) 0.125 MG tablet TAKE 1 TABLET DAILY (NEED TO CALL AND MAKE APPOINTMENT BY APRIL FOR FURTHER REFILLS)  . diltiazem (CARDIZEM CD) 180 MG 24 hr capsule TAKE 1 CAPSULE DAILY (PLEASE MAKE OVERDUE APPOINTMENT WITH DR. Radford Pax BEFORE ANYMORE REFILLS [phone])  . ELIQUIS 5 MG TABS tablet TAKE 1 TABLET TWICE A DAY  .  fluticasone (FLONASE) 50 MCG/ACT nasal spray 1 spray each nostril following sinus rinses twice daily  . fluticasone-salmeterol (ADVAIR HFA) 230-21 MCG/ACT inhaler USE 2 INHALATIONS TWICE A DAY  . furosemide (LASIX) 20 MG tablet TAKE 2 TABLETS EVERY MORNING AND 1 TABLET EVERY EVENING (NEED APPOINTMENT, 2ND ATTEMPT)  . gabapentin (NEURONTIN) 400 MG capsule TAKE 1 CAPSULE TWICE A DAY  . Multiple Vitamins-Iron (MULTIVITAMIN/IRON) TABS Take 1 tablet by mouth daily.   Marland Kitchen omeprazole (PRILOSEC) 20 MG capsule TAKE 1 CAPSULE TWICE A DAY 30 MINUTES BEFORE MEALS (BEFORE LUNCH AND DINNER)  . potassium chloride SA (K-DUR,KLOR-CON) 20 MEQ tablet Take 1 tablet by mouth twice a day  . Vitamin D, Ergocalciferol, (DRISDOL) 1.25 MG (50000 UT) CAPS capsule TAKE 1 CAPSULE EVERY WEDNESDAY AND SUNDAY    Allergies:  Allergies  Allergen Reactions  . Combigan [Brimonidine Tartrate-Timolol] Itching    Eyes itch, reddened  . Other Other (See Comments)    Per patient made OU red, Sore, and sensitivity to light  . Sulfa Antibiotics Rash  . Sulfamethoxazole-Trimethoprim Hives     Review of Systems:  A fourteen system review of systems was performed and found to be positive as per HPI.   Objective:   Blood pressure 121/81, pulse 82, height 5' (1.524 m), weight 132 lb (59.9 kg), SpO2 97 %. Body mass index is 25.78 kg/m. General:  Well Developed, well nourished, appropriate for stated age.  Neuro:  Alert and oriented,  extra-ocular muscles intact  HEENT:  Normocephalic, atraumatic, neck supple, no carotid bruits  appreciated  Skin:  no gross rash, warm, pink. Cardiac: Irregularly irregular Respiratory:  ECTA B/L and A/P, Not using accessory muscles, speaking in full sentences- unlabored. Vascular:  Ext warm, no cyanosis apprec.; cap RF less 2 sec. Psych:  No HI/SI, judgement and insight good but patient does appear confused, Euthymic mood. Full Affect.  6CIT Screen 12/05/2018 03/08/2017  What Year? 0 points 0 points  What month? 0 points 0 points  What time? 0 points 0 points  Count back from 20 0 points 0 points  Months in reverse 0 points 0 points  Repeat phrase 0 points 2 points  Total Score 0 2

## 2018-12-07 LAB — BASIC METABOLIC PANEL
BUN/Creatinine Ratio: 16 (ref 12–28)
BUN: 15 mg/dL (ref 8–27)
CO2: 25 mmol/L (ref 20–29)
Calcium: 9.5 mg/dL (ref 8.7–10.3)
Chloride: 96 mmol/L (ref 96–106)
Creatinine, Ser: 0.91 mg/dL (ref 0.57–1.00)
GFR calc Af Amer: 70 mL/min/{1.73_m2} (ref >59)
GFR calc non Af Amer: 61 mL/min/{1.73_m2} (ref >59)
Glucose: 79 mg/dL (ref 65–99)
Potassium: 4 mmol/L (ref 3.5–5.2)
Sodium: 138 mmol/L (ref 134–144)

## 2018-12-07 LAB — CBC
Hematocrit: 39.2 % (ref 34.0–46.6)
Hemoglobin: 13.8 g/dL (ref 11.1–15.9)
MCH: 31.6 pg (ref 26.6–33.0)
MCHC: 35.2 g/dL (ref 31.5–35.7)
MCV: 90 fL (ref 79–97)
Platelets: 441 10*3/uL (ref 150–450)
RBC: 4.37 x10E6/uL (ref 3.77–5.28)
RDW: 12.6 % (ref 11.7–15.4)
WBC: 9.7 10*3/uL (ref 3.4–10.8)

## 2018-12-07 LAB — SPECIMEN STATUS REPORT

## 2018-12-07 LAB — TSH: TSH: 1.35 u[IU]/mL (ref 0.450–4.500)

## 2018-12-07 LAB — CULTURE, URINE COMPREHENSIVE

## 2018-12-07 LAB — RPR: RPR Ser Ql: NONREACTIVE

## 2018-12-07 LAB — VITAMIN B12: Vitamin B-12: 451 pg/mL (ref 232–1245)

## 2018-12-07 LAB — FOLATE: Folate: 8.9 ng/mL (ref >3.0)

## 2018-12-07 LAB — SEDIMENTATION RATE: Sed Rate: 71 mm/hr — ABNORMAL HIGH (ref 0–40)

## 2018-12-20 ENCOUNTER — Other Ambulatory Visit: Payer: Self-pay | Admitting: Oncology

## 2018-12-22 ENCOUNTER — Other Ambulatory Visit: Payer: Self-pay

## 2018-12-22 ENCOUNTER — Ambulatory Visit (INDEPENDENT_AMBULATORY_CARE_PROVIDER_SITE_OTHER): Payer: Medicare Other | Admitting: Neurology

## 2018-12-22 ENCOUNTER — Encounter: Payer: Self-pay | Admitting: Neurology

## 2018-12-22 VITALS — BP 111/74 | HR 96 | Temp 96.3°F | Wt 127.6 lb

## 2018-12-22 DIAGNOSIS — R0989 Other specified symptoms and signs involving the circulatory and respiratory systems: Secondary | ICD-10-CM | POA: Diagnosis not present

## 2018-12-22 DIAGNOSIS — R410 Disorientation, unspecified: Secondary | ICD-10-CM | POA: Diagnosis not present

## 2018-12-22 DIAGNOSIS — G3184 Mild cognitive impairment, so stated: Secondary | ICD-10-CM

## 2018-12-22 DIAGNOSIS — I6389 Other cerebral infarction: Secondary | ICD-10-CM | POA: Diagnosis not present

## 2018-12-22 MED ORDER — LEVETIRACETAM ER 500 MG PO TB24: 500.0000 mg | ORAL_TABLET | Freq: Every day | ORAL | 3 refills | Status: DC

## 2018-12-22 NOTE — Progress Notes (Signed)
[facility] [address] [place] [address] [phone]       OFFICE FOLLOW-UP NOTE  [name] Date of Birth:  [birth year] Medical Record Number:  [id number]   HPI: [name] is a 78 year old pleasant Caucasian lady with past medical history of left breast cancer with radiation, persistent atrial fibrillation on Eliquis, multinodular goiter, laryngeal spasms, essential hypertension, chronic kidney disease, diastolic heart failure, and hyperlipidemia who was last seen in office on 11/19/2016.  She is seen today for follow-up following recent ER visit on November 30, 2018.  She was doing well until 2 days prior when the husband sent her to another room at home to get her checkbook.  The patient recalled trying to move a rolling chair out of her way and suddenly she fell backwards leaning on the wall and slid down to the floor.  The husband arrived a few minutes later and noted that she was unresponsive and was not speaking or answering his questions.  He called EMS and by the time they arrived patient was gradually returning back to normal.  She could not remember the episode and did not have a memory of the event.  She denied any chest pain, sweating, palpitations, headache, tongue bite injury or incontinence.  CT scan of the head was unremarkable and MRI scan showed no evidence of acute stroke or hemorrhage.  EEG showed bitemporal focal slowing.  Patient was on Eliquis for atrial fibrillation and prior stroke which was continued.  She states she is done well since then and has not had any further episodes of office consciousness or seizures.  The patient's husband however feels that is noticed that her memory is not the same and she has had some cognitive decline.  There have been no episodes of confusion, disorientation or agitation.  She has not had any delusions hallucinations.  She has not had any lab work to look for reversible causes of cognitive impairment.  She  denies any headache, falls or injuries.  She feels her balance may be slightly off. 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 Dx 04/2015 at time of stroke. b. DCCV 06/2015 - did not hold. Amio started then stopped due to QT prolongation.   Postmenopausal    Radiation 10/22/14-11/23/14   Left Breast   Stricture and stenosis of cervix     Social History:  Social History   Socioeconomic History   Marital status: Married    Spouse name: Not on file   Number of children: Not on file  Years of education: Not on file   Highest education level: Not on file  Occupational History   Not on file  Social Needs   Financial resource strain: Not on file   Food insecurity    Worry: Not on file    Inability: Not on file   Transportation needs    Medical: Not on file    Non-medical: Not on file  Tobacco Use   Smoking status: Former Smoker    Packs/day: 0.75    Years: 5.00    Pack years: 3.75    Quit date: 06/14/1961    Years since quitting: 57.5   Smokeless tobacco: Never Used  Substance and Sexual Activity   Alcohol use: Yes    Alcohol/week: 0.0 standard drinks    Comment:  occasional wine   Drug use: No   Sexual activity: Not Currently  Lifestyle   Physical activity    Days per week: Not on file    Minutes per session: Not on file   Stress: Not on file  Relationships   Social connections    Talks on phone: Not on file    Gets together: Not on file    Attends religious service: Not on file    Active member of club or organization: Not on file    Attends meetings of clubs or organizations: Not on file    Relationship status: Not on file   Intimate partner violence    Fear of current or ex partner: Not on file    Emotionally abused: Not on file    Physically abused: Not on file    Forced sexual activity: Not on file  Other Topics Concern   Not on file  Social History Narrative   Retired. Lives with husband.     Medications:   Current Outpatient Medications on File Prior to Visit  Medication Sig Dispense Refill   albuterol (PROVENTIL HFA;VENTOLIN HFA) 108 (90 Base) MCG/ACT inhaler Inhale 2 puffs into the lungs every 6 (six) hours as needed for wheezing or shortness of breath.     anastrozole (ARIMIDEX) 1 MG tablet TAKE 1 TABLET DAILY 90 tablet 3   ARMOUR THYROID 60 MG tablet TAKE 1 TABLET DAILY BEFORE BREAKFAST 90 tablet 1   atorvastatin (LIPITOR) 40 MG tablet TAKE 1 TABLET DAILY 90 tablet 1   calcium-vitamin D (OSCAL WITH D) 500-200 MG-UNIT tablet Take 1 tablet by mouth daily with breakfast. 30 tablet 0   digoxin (LANOXIN) 0.125 MG tablet TAKE 1 TABLET DAILY (NEED TO CALL AND MAKE APPOINTMENT BY APRIL FOR FURTHER REFILLS) 90 tablet 1   diltiazem (CARDIZEM CD) 180 MG 24 hr capsule TAKE 1 CAPSULE DAILY (PLEASE MAKE OVERDUE APPOINTMENT WITH DR. Radford Pax BEFORE ANYMORE REFILLS [phone]) 15 capsule 0   ELIQUIS 5 MG TABS tablet TAKE 1 TABLET TWICE A DAY 180 tablet 3   fluticasone (FLONASE) 50 MCG/ACT nasal spray 1 spray each nostril following sinus rinses twice daily 16 g 2   fluticasone-salmeterol (ADVAIR HFA) 230-21 MCG/ACT inhaler  USE 2 INHALATIONS TWICE A DAY 36 g 4   furosemide (LASIX) 20 MG tablet TAKE 2 TABLETS EVERY MORNING AND 1 TABLET EVERY EVENING (NEED APPOINTMENT, 2ND ATTEMPT) 90 tablet 1   gabapentin (NEURONTIN) 400 MG capsule TAKE 1 CAPSULE TWICE A DAY 180 capsule 1   Multiple Vitamins-Iron (MULTIVITAMIN/IRON) TABS Take 1 tablet by mouth daily.      omeprazole (PRILOSEC) 20 MG capsule TAKE 1 CAPSULE TWICE A DAY 30 MINUTES BEFORE MEALS (BEFORE LUNCH  AND DINNER) 180 capsule 1   potassium chloride SA (K-DUR,KLOR-CON) 20 MEQ tablet Take 1 tablet by mouth twice a day 180 tablet 3   Vitamin D, Ergocalciferol, (DRISDOL) 1.25 MG (50000 UT) CAPS capsule TAKE 1 CAPSULE EVERY WEDNESDAY AND SUNDAY 24 capsule 4   Current Facility-Administered Medications on File Prior to Visit  Medication Dose Route Frequency Provider Last Rate Last Dose   denosumab (PROLIA) injection 60 mg  60 mg Subcutaneous Once Magrinat, Virgie Dad, MD        Allergies:   Allergies  Allergen Reactions   Combigan [Brimonidine Tartrate-Timolol] Itching    Eyes itch, reddened   Other Other (See Comments)    Per patient made OU red, Sore, and sensitivity to light   Sulfa Antibiotics Rash   Sulfamethoxazole-Trimethoprim Hives    Physical Exam General: Petite elderly Caucasian lady, seated, in no evident distress Head: head normocephalic and atraumatic.  Neck: supple with no carotid or supraclavicular bruits Cardiovascular: regular rate and rhythm, no murmurs Musculoskeletal: Mild kyphoscoliosis Skin:  no rash/petichiae Vascular:  Normal pulses all extremities Vitals:   12/22/18 1304  BP: 111/74  Pulse: 96  Temp: (!) 96.3 F (35.7 C)   Neurologic Exam Mental Status: Awake and fully alert. Oriented to place and time. Recent and remote memory diminished. Attention span, concentration and fund of knowledge appropriate. Mood and affect appropriate.  Diminished recall 0/3.  Able to name only 5 animals which can walk on 4 legs.  Clock  drawing 3/4. Cranial Nerves: Fundoscopic exam reveals sharp disc margins. Pupils equal, briskly reactive to light. Extraocular movements full without nystagmus. Visual fields full to confrontation. Hearing intact. Facial sensation intact. Face, tongue, palate moves normally and symmetrically.  Motor: Normal bulk and tone. Normal strength in all tested extremity muscles. Sensory.: intact to touch ,pinprick .position and vibratory sensation.  Coordination: Rapid alternating movements normal in all extremities. Finger-to-nose and heel-to-shin performed accurately bilaterally. Gait and Station: Arises from chair without difficulty. Stance is normal. Gait demonstrates normal stride length and balance . Able to heel, toe and tandem walk with moderate difficulty.  Reflexes: 1+ and symmetric. Toes downgoing.       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  Recent episode of brief loss of consciousness followed by confusion possibly unwitnessed seizure with postictal confusion in September 2020.  Also new complaints of memory loss and mild cognitive impairment.     PLAN: I had a long discussion the patient and her husband regarding her recent episode of brief loss of consciousness confusion as well as mild memory loss.  I believe she may be had an unwitnessed seizure followed by postictal confusion.  I recommend repeating EEG as well as trial of Keppra XR 500 mg daily.  She was advised not to drive 6 months since her episode in September as per [facility] law check dementia panel labs.  She was also encouraged to participate in increasing cognitively challenging activities like solving crossword puzzles, playing bridge and sudoku.  Continue Eliquis for stroke prevention for A. fib and maintain strict control of hypertension with blood pressure goal below 130/90,  lipids with LDL cholesterol goal of 70 mg percent and diabetes with hemoglobin A1c goal below 6.5%.  She will return for follow-up in the future in 3 months or call earlier if necessary. Greater than 50% of time during this 25 minute visit was spent on  counseling,explanation of diagnosis, planning of further management, discussion with patient and family and coordination of care Antony Contras, MD Note: This document was prepared with digital dictation and possible smart phrase technology. Any transcriptional errors that result from this process are unintentional

## 2018-12-22 NOTE — Patient Instructions (Addendum)
I had a long discussion the patient and her husband regarding her recent episode of brief loss of consciousness confusion as well as mild memory loss.  I believe she may be had an unwitnessed seizure followed by postictal confusion.  I recommend repeating EEG as well as trial of Keppra XR 500 mg daily.  She was advised not to drive 6 months since her episode in September as per [facility] law check dementia panel labs.  She was also encouraged to participate in increasing cognitively challenging activities like solving crossword puzzles, playing bridge and sudoku.  Continue Eliquis for stroke prevention for A. fib and maintain strict control of hypertension with blood pressure goal below 130/90, lipids with LDL cholesterol goal of 70 mg percent and diabetes with hemoglobin A1c goal below 6.5%.  She will return for follow-up in the future in 3 months or call earlier if necessary.

## 2018-12-23 LAB — DEMENTIA PANEL
Homocysteine: 13 umol/L (ref 0.0–19.2)
RPR Ser Ql: NONREACTIVE
TSH: 0.729 u[IU]/mL (ref 0.450–4.500)
Vitamin B-12: 376 pg/mL (ref 232–1245)

## 2018-12-28 ENCOUNTER — Other Ambulatory Visit: Payer: Self-pay | Admitting: Cardiology

## 2018-12-30 ENCOUNTER — Other Ambulatory Visit: Payer: Self-pay | Admitting: Cardiology

## 2019-01-08 ENCOUNTER — Other Ambulatory Visit: Payer: Self-pay | Admitting: Cardiology

## 2019-01-11 ENCOUNTER — Other Ambulatory Visit: Payer: Medicare Other

## 2019-01-13 DIAGNOSIS — H524 Presbyopia: Secondary | ICD-10-CM | POA: Diagnosis not present

## 2019-01-13 DIAGNOSIS — H2513 Age-related nuclear cataract, bilateral: Secondary | ICD-10-CM | POA: Diagnosis not present

## 2019-01-13 DIAGNOSIS — H43813 Vitreous degeneration, bilateral: Secondary | ICD-10-CM | POA: Diagnosis not present

## 2019-01-13 DIAGNOSIS — H401133 Primary open-angle glaucoma, bilateral, severe stage: Secondary | ICD-10-CM | POA: Diagnosis not present

## 2019-01-18 ENCOUNTER — Ambulatory Visit (INDEPENDENT_AMBULATORY_CARE_PROVIDER_SITE_OTHER): Payer: Medicare Other | Admitting: Neurology

## 2019-01-18 ENCOUNTER — Other Ambulatory Visit: Payer: Self-pay

## 2019-01-18 DIAGNOSIS — G3184 Mild cognitive impairment, so stated: Secondary | ICD-10-CM

## 2019-01-18 DIAGNOSIS — R410 Disorientation, unspecified: Secondary | ICD-10-CM | POA: Diagnosis not present

## 2019-01-23 ENCOUNTER — Other Ambulatory Visit: Payer: Self-pay | Admitting: Physician Assistant

## 2019-01-30 ENCOUNTER — Other Ambulatory Visit: Payer: Self-pay | Admitting: Family Medicine

## 2019-01-30 DIAGNOSIS — I5032 Chronic diastolic (congestive) heart failure: Secondary | ICD-10-CM

## 2019-02-16 ENCOUNTER — Other Ambulatory Visit: Payer: Self-pay | Admitting: Family Medicine

## 2019-02-16 DIAGNOSIS — I10 Essential (primary) hypertension: Secondary | ICD-10-CM

## 2019-02-16 DIAGNOSIS — I351 Nonrheumatic aortic (valve) insufficiency: Secondary | ICD-10-CM

## 2019-02-16 DIAGNOSIS — I4819 Other persistent atrial fibrillation: Secondary | ICD-10-CM

## 2019-02-16 DIAGNOSIS — I5032 Chronic diastolic (congestive) heart failure: Secondary | ICD-10-CM

## 2019-02-17 NOTE — Telephone Encounter (Signed)
Pt last visit was 12/05/2018 for hospital f/u and told to f/u in 2-3 months.  She has appt 03/06/2019, which is within the 2-3 month f/u period as directed.  Please review for appropriateness of refill and authorize if appropriate.  Charyl Bigger, CMA

## 2019-03-06 ENCOUNTER — Other Ambulatory Visit: Payer: Self-pay

## 2019-03-06 ENCOUNTER — Encounter: Payer: Self-pay | Admitting: Family Medicine

## 2019-03-06 ENCOUNTER — Ambulatory Visit (INDEPENDENT_AMBULATORY_CARE_PROVIDER_SITE_OTHER): Payer: Medicare Other | Admitting: Family Medicine

## 2019-03-06 DIAGNOSIS — E785 Hyperlipidemia, unspecified: Secondary | ICD-10-CM | POA: Diagnosis not present

## 2019-03-06 DIAGNOSIS — I4819 Other persistent atrial fibrillation: Secondary | ICD-10-CM

## 2019-03-06 DIAGNOSIS — I6389 Other cerebral infarction: Secondary | ICD-10-CM

## 2019-03-06 DIAGNOSIS — N183 Chronic kidney disease, stage 3 unspecified: Secondary | ICD-10-CM | POA: Diagnosis not present

## 2019-03-06 DIAGNOSIS — I639 Cerebral infarction, unspecified: Secondary | ICD-10-CM | POA: Diagnosis not present

## 2019-03-06 DIAGNOSIS — R413 Other amnesia: Secondary | ICD-10-CM

## 2019-03-06 DIAGNOSIS — Z79899 Other long term (current) drug therapy: Secondary | ICD-10-CM

## 2019-03-06 DIAGNOSIS — R4182 Altered mental status, unspecified: Secondary | ICD-10-CM | POA: Diagnosis not present

## 2019-03-06 DIAGNOSIS — G40909 Epilepsy, unspecified, not intractable, without status epilepticus: Secondary | ICD-10-CM | POA: Insufficient documentation

## 2019-03-06 DIAGNOSIS — I10 Essential (primary) hypertension: Secondary | ICD-10-CM | POA: Diagnosis not present

## 2019-03-06 DIAGNOSIS — E032 Hypothyroidism due to medicaments and other exogenous substances: Secondary | ICD-10-CM | POA: Diagnosis not present

## 2019-03-06 DIAGNOSIS — I5032 Chronic diastolic (congestive) heart failure: Secondary | ICD-10-CM | POA: Diagnosis not present

## 2019-03-06 DIAGNOSIS — E559 Vitamin D deficiency, unspecified: Secondary | ICD-10-CM

## 2019-03-06 DIAGNOSIS — R7303 Prediabetes: Secondary | ICD-10-CM | POA: Diagnosis not present

## 2019-03-06 NOTE — Progress Notes (Signed)
Telehealth office visit note for Patricia [name], D.O- at [facility] at [facility]   I connected with current patient today and verified that I am speaking with the correct person using two identifiers.   . Location of the patient: Home . Location of the provider: Office Only the patient (+/- their family members at pt's discretion) and myself were participating in the encounter - This visit type was conducted due to national recommendations for restrictions regarding the COVID-19 Pandemic (e.g. social distancing) in an effort to limit this patient's exposure and mitigate transmission in our community.  This format is felt to be most appropriate for this patient at this time.   - The patient did not have access to video technology or had technical difficulties with video requiring transitioning to audio format only. - No physical exam could be performed with this format, beyond that communicated to [name] by the patient/ family members as noted.   - Additionally my office staff/ schedulers discussed with the patient that there may be a monetary charge related to this service, depending on their medical insurance.   The patient expressed understanding, and agreed to proceed.       History of Present Illness: Hypertension and Chronic Kidney Disease   I, Patricia [name], am serving as scribe for Dr. Mellody [name].  Notes that she's been fine; overall feeling well.  - Hospitalization after Seizure, Neurology Follow-Up She has followed up with neurology; "he really didn't make much sense to me."  Say "I won't say he poopoo'ed it, he just didn't seem to make much sense."  Patient agrees that there is no way to know whether she had a seizure or not, because she was alone when the incident happened.  She has not obtained her repeat EEG.  Patient notes she has not driven since the episode.  Says "it's pretty rough, but Chuck's good at taking me where I want to go."  Notes she has  friends and people to go places with if desired, "but it's not really the time of year to go places."  She is not currently taking anti-seizure medication.  Says "he had one refill, and when that was over, I just assumed that was the end."  Notes she was taking one tablet by mouth daily, and has one refill before October of 2021.  Her husband feels her memory is not what it used to be.  States her husband has voiced his concerns regarding her memory several times.  HPI:  Hypertension:  Denies concerns about BP at home.  Says she's been fine; hasn't had any further episodes.  - Patient reports good compliance with medication and/or lifestyle modification  - Her denies acute concerns or problems related to treatment plan  - She denies new onset of: chest pain, exercise intolerance, shortness of breath, dizziness, visual changes, headache, lower extremity swelling or claudication.   Last 3 blood pressure readings in our office are as follows: BP Readings from Last 3 Encounters:  12/22/18 111/74  12/05/18 121/81  11/30/18 125/75   There were no vitals filed for this visit.      No flowsheet data found.  Depression screen [facility] 2/9 03/06/2019 12/05/2018 08/15/2018 04/18/2018 02/08/2018  Decreased Interest 0 1 0 0 0  Down, Depressed, Hopeless 0 0 0 0 0  PHQ - 2 Score 0 1 0 0 0  Altered sleeping 0 3 0 0 0  Tired, decreased energy 0 3 0 0 0  Change in appetite 0 2 0 0 0  Feeling bad or failure about yourself  0 0 0 0 0  Trouble concentrating 0 0 0 0 0  Moving slowly or fidgety/restless 0 0 0 0 0  Suicidal thoughts 0 0 0 0 0  PHQ-9 Score 0 9 0 0 0  Difficult doing work/chores - Somewhat difficult Somewhat difficult Not difficult at all Not difficult at all  Some recent data might be hidden      Impression and Recommendations:    1. Cerebrovascular accident (CVA) due to other mechanism ([place])   2. Seizure disorder ([place])- txed by Leonie Man- Neuro   3. Memory changes-  txed by Leonie Man-  Neuro   4. Essential hypertension   5. Prediabetes   6. Hyperlipidemia, unspecified hyperlipidemia type   7. Persistent atrial fibrillation ([place])   8. Cerebrovascular accident (CVA), unspecified mechanism ([place])   9. Stage 3 chronic kidney disease, unspecified whether stage 3a or 3b CKD   10. Chronic diastolic heart failure ([place])   11. Hypothyroidism due to medication   12. Altered mental status, unspecified altered mental status type   13. High risk medication use   14. Vitamin D deficiency disease      Essential Hypertension - Stable at this time. - Continue treatment plan as established.  - Ongoing ambulatory BP monitoring encouraged. - Will continue to monitor.  CVA, Memory Changes, Seizure Disorder, Treated by Neuro - Stable at this time.  - Discussed that per evaluation of neurology, patient may have had an unwitnessed seizure. - Discussed need for repeat EEG and all follow-up as recommended by neurology.  - Encouraged patient to call Neurology and ask whether or not she is meant to be continuing anti-seizure medication as prescribed.  - Told patient to ask Dr. Leonie Man of neurology all applicable concerns and questions. - Reviewed need for follow-up with neurology on January 25 as scheduled. - Encouraged patient to ask Neurology regarding evaluation for memory deficit as well.  - Will continue to monitor alongside specialist.  Recommendations - Need for Medicare Wellness and blood work near future. - Discussed that this appointment may be held over the phone.    - As part of my medical decision making, I reviewed the following data within the [place] History obtained from pt /family, CMA notes reviewed and incorporated if applicable, Labs reviewed, Radiograph/ tests reviewed if applicable and OV notes from prior OV's with me, as well as other specialists she/he has seen since seeing me last, were all reviewed and used in my medical decision making  process today.    - Additionally, discussion had with patient regarding our treatment plan, and their biases/concerns about that plan were used in my medical decision making today.    - The patient agreed with the plan and demonstrated an understanding of the instructions.   No barriers to understanding were identified.     Return for Medicare Wellness and full fasting lab work same day in near future.    Orders Placed This Encounter  Procedures  . Magnesium  . Phosphorus  . T4, free- Future  . TSH  . VITAMIN D 25 Hydroxy (Vit-D Deficiency, Fractures)  . Lipid panel  . Hemoglobin A1c  . Comprehensive metabolic panel    Medications Discontinued During This Encounter  Medication Reason  . levETIRAcetam (KEPPRA XR) 500 MG 24 hr tablet Error      I provided 20+ minutes of non face-to-face time during this encounter.  Additional time was spent with charting and coordination of care before and after the actual visit commenced.   Note:  This note was prepared with assistance of Dragon voice recognition software. Occasional wrong-word or sound-a-like substitutions may have occurred due to the inherent limitations of voice recognition software.   This document serves as a record of services personally performed by Patricia Dance, DO. It was created on her behalf by Patricia [name], a trained medical scribe. The creation of this record is based on the scribe's personal observations and the provider's statements to them.   This case required medical decision making of at least moderate complexity. The above documentation has been reviewed to be accurate and was completed by Marjory Sneddon, D.O.      Patient Care Team    Relationship Specialty Notifications Start End  Patricia Dance, DO PCP - General Family Medicine  09/08/16   Sueanne Margarita, MD PCP - Cardiology Cardiology Admissions 11/30/18   Constance Haw, MD PCP - Electrophysiology Cardiology Admissions 11/30/18    Thea Silversmith, MD Consulting Physician Radiation Oncology  11/29/14   Magrinat, Virgie Dad, MD Consulting Physician Oncology  11/29/14   Stark Klein, MD Consulting Physician General Surgery  11/29/14   Sylvan Cheese, NP Nurse Practitioner Nurse Practitioner  11/29/14    Comment: Survivorship  Ladene Artist, MD Consulting Physician Gastroenterology  02/14/15   Sueanne Margarita, MD Consulting Physician Cardiology  09/08/16   Marygrace Drought, MD Consulting Physician Ophthalmology  01/06/17    Comment: Sees him for glaucoma  Garvin Fila, MD Consulting Physician Neurology  12/05/18      -Vitals obtained; medications/ allergies reconciled;  personal medical, social, Sx etc.histories were updated by CMA, reviewed by me and are reflected in chart   Patient Active Problem List   Diagnosis Date Noted  . Prediabetes 01/06/2017  . CKD (chronic kidney disease), stage III 10/30/2016  . Persistent atrial fibrillation ([place])   . Chronic diastolic heart failure ([place]) 06/17/2015  . Essential hypertension 06/17/2015  . CVA (cerebral vascular accident) ([place]) 04/30/2015  . Hyperlipidemia 06/10/2009  . Lung cancer, lingula ([place]) 09/08/2016  . 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 Malignant neoplasm of upper-outer quadrant of left breast in female, estrogen receptor positive ([place]) 06/08/2013  . Multinodular goiter 01/11/2013  . 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 ESOPHAGEAL STRICTURE 09/26/2009  . GERD 09/26/2009  . Cough variant asthma 06/27/2007  . Chronic cough 01/21/2007  . LUNG CANCER, HX OF 07/29/2006  . Arthritis 01/27/2017  . Vitamin D deficiency 01/06/2017  . Postmenopausal bone loss 01/06/2017  . Osteopenia 05/28/2007  . Seizure disorder ([place])- txed by Leonie Man- Neuro 03/06/2019  . Leukocytosis 12/05/2018  . Bacteria in urine 12/05/2018  . Altered mental status 12/05/2018  . Cerebrovascular accident ([place]) 09/27/2017  . Memory changes 09/27/2017  . Hypocalcemia 05/27/2017   . h/o Radiation 04/14/2017  . High risk medication use 04/14/2017  . History of CVA (cerebrovascular accident) 05/14/2016  . Abnormal PFT 10/24/2015  . Aortic regurgitation 06/17/2015  . CVA (cerebral infarction)   . Pulmonary edema 04/27/2015  . General medical examination 06/09/2010  . Screening for malignant neoplasm of the cervix 06/09/2010  . HEARING LOSS, UNSPEC. 08/15/2009  . Diverticulosis of large intestine 06/10/2009  . GLAUCOMA, BILATERAL 02/07/2009  . STRICTURE AND STENOSIS OF CERVIX 05/06/2007  . POSTMENOPAUSAL STATUS 05/06/2007     Current Meds  Medication Sig  . albuterol (PROVENTIL HFA;VENTOLIN HFA) 108 (90 Base) MCG/ACT inhaler Inhale 2  puffs into the lungs every 6 (six) hours as needed for wheezing or shortness of breath.  . anastrozole (ARIMIDEX) 1 MG tablet TAKE 1 TABLET DAILY  . ARMOUR THYROID 60 MG tablet TAKE 1 TABLET DAILY BEFORE BREAKFAST  . atorvastatin (LIPITOR) 40 MG tablet TAKE 1 TABLET DAILY  . calcium-vitamin D (OSCAL WITH D) 500-200 MG-UNIT tablet Take 1 tablet by mouth daily with breakfast.  . digoxin (LANOXIN) 0.125 MG tablet Take 1 tablet (0.125 mg total) by mouth daily.  Marland Kitchen diltiazem (CARDIZEM CD) 180 MG 24 hr capsule Take 1 capsule (180 mg total) by mouth daily.  Marland Kitchen ELIQUIS 5 MG TABS tablet TAKE 1 TABLET TWICE A DAY  . fluticasone (FLONASE) 50 MCG/ACT nasal spray 1 spray each nostril following sinus rinses twice daily  . fluticasone-salmeterol (ADVAIR HFA) 230-21 MCG/ACT inhaler USE 2 INHALATIONS TWICE A DAY  . furosemide (LASIX) 20 MG tablet TAKE 2 TABLETS EVERY MORNING AND 1 TABLET EVERY EVENING (NEED APPOINTMENT, 2ND ATTEMPT)  . gabapentin (NEURONTIN) 400 MG capsule TAKE 1 CAPSULE TWICE A DAY  . Multiple Vitamins-Iron (MULTIVITAMIN/IRON) TABS Take 1 tablet by mouth daily.   Marland Kitchen omeprazole (PRILOSEC) 20 MG capsule TAKE 1 CAPSULE TWICE A DAY 30 MINUTES BEFORE MEALS (BEFORE LUNCH AND DINNER)  . potassium chloride SA (K-DUR,KLOR-CON) 20 MEQ tablet  Take 1 tablet by mouth twice a day  . Vitamin D, Ergocalciferol, (DRISDOL) 1.25 MG (50000 UT) CAPS capsule TAKE 1 CAPSULE EVERY WEDNESDAY AND SUNDAY  . [DISCONTINUED] levETIRAcetam (KEPPRA XR) 500 MG 24 hr tablet Take 500 mg by mouth daily.     Allergies:  Allergies  Allergen Reactions  . Combigan [Brimonidine Tartrate-Timolol] Itching    Eyes itch, reddened  . Other Other (See Comments)    Per patient made OU red, Sore, and sensitivity to light  . Sulfa Antibiotics Rash  . Sulfamethoxazole-Trimethoprim Hives     ROS:  See above HPI for pertinent positives and negatives   Objective:   There were no vitals taken for this visit.  (if some vitals are omitted, this means that patient was UNABLE to obtain them even though they were asked to get them prior to OV today.  They were asked to call us at their earliest convenience with these once obtained. )  General: A & O * 3; sounds in no acute distress; in usual state of health.  Skin: Pt confirms warm and dry extremities and pink fingertips HEENT: Pt confirms lips non-cyanotic Chest: Patient confirms normal chest excursion and movement Respiratory: speaking in full sentences, no conversational dyspnea; patient confirms no use of accessory muscles Psych: insight appears good, mood- appears full

## 2019-03-13 DIAGNOSIS — H532 Diplopia: Secondary | ICD-10-CM | POA: Diagnosis not present

## 2019-03-13 DIAGNOSIS — H401133 Primary open-angle glaucoma, bilateral, severe stage: Secondary | ICD-10-CM | POA: Diagnosis not present

## 2019-03-14 ENCOUNTER — Ambulatory Visit: Payer: Medicare Other | Attending: Internal Medicine

## 2019-03-14 DIAGNOSIS — Z23 Encounter for immunization: Secondary | ICD-10-CM | POA: Diagnosis not present

## 2019-03-14 NOTE — Progress Notes (Signed)
   Covid-19 Vaccination Clinic  Name:  Patricia [name]    MRN: [id number] DOB: [birth year]  03/14/2019  [name] was observed post Covid-19 immunization for 30 minutes based on pre-vaccination screening without incidence. She was provided with Vaccine Information Sheet and instruction to access the V-Safe system.   [name] was instructed to call 911 with any severe reactions post vaccine: Marland Kitchen Difficulty breathing  . Swelling of your face and throat  . A fast heartbeat  . A bad rash all over your body  . Dizziness and weakness    Immunizations Administered    Name Date Dose VIS Date Route   Pfizer COVID-19 Vaccine 03/14/2019 10:39 AM 0.3 mL 02/10/2019 Intramuscular   Manufacturer: [employer]   Lot: [id number]   [place]: [id number]

## 2019-03-15 ENCOUNTER — Other Ambulatory Visit: Payer: Self-pay

## 2019-03-15 MED ORDER — LEVETIRACETAM ER 500 MG PO TB24: 500.0000 mg | ORAL_TABLET | Freq: Every day | ORAL | 1 refills | Status: DC

## 2019-03-27 ENCOUNTER — Telehealth (INDEPENDENT_AMBULATORY_CARE_PROVIDER_SITE_OTHER): Payer: Medicare Other | Admitting: Neurology

## 2019-03-27 ENCOUNTER — Other Ambulatory Visit: Payer: Self-pay

## 2019-03-27 DIAGNOSIS — I6389 Other cerebral infarction: Secondary | ICD-10-CM

## 2019-03-27 DIAGNOSIS — G3184 Mild cognitive impairment, so stated: Secondary | ICD-10-CM

## 2019-03-27 MED ORDER — PREVAGEN 10 MG PO CAPS: 1.0000 | ORAL_CAPSULE | Freq: Every morning | ORAL | 1 refills | Status: DC

## 2019-03-27 NOTE — Progress Notes (Signed)
Virtual Visit via Telephone Note  I connected with Patricia [name] on 03/27/19 at  9:30 AM EST by telephone and verified that I am speaking with the correct person using two identifiers.  Location: Patient: at her home Provider: at remote office   I discussed the limitations, risks, security and privacy concerns of performing an evaluation and management service by telephone and the availability of in person appointments. I also discussed with the patient that there may be a patient responsible charge related to this service. The patient expressed understanding and agreed to proceed.   History of Present Illness: Patient is seen today for remote telephonic follow-up visit following last office visit on 12/22/2018.  She is accompanied by her husband who was present throughout this visit.  Patient states that she is doing well she has had no further episodes of confusion, disorientation or seizure-like episode.  She has been tolerating Keppra XR 500 mg quite well without any side effects.  She has been compliant with taking it every day.  She had lab work done at the last visit and vitamin B12, TSH, homocystine and RPR were normal.  She also had EEG done which was normal without any epileptiform activity.  The patient has not been participating in regular activities which are mentally challenging like solving crossword puzzles, bridge of sudoku.  She has not been driving.  She has no new complaints today.   Observations/Objective: Physical and neurological exams are limited due to constraints of virtual telephonic visit.  Patient seems pleasant awake alert and cooperative.  Speech and language appear normal.  She has diminished recall 2/3.  She is able to name only 4 animals which can walk on 4 legs.  There is no dysarthria.  Assessment and Plan: 79 year old Caucasian lady with remote history of right MCA infarct from A. fib in February 2017 with vascular risk factors of hypertension, hyperlipidemia and  atrial fibrillation.  Brief episode of loss of consciousness followed by confusion possibly unwitnessed seizure and postictal confusion in September 2020 as well as mild cognitive impairment. I had a long discussion with the patient and husband regarding episode of confusion, memory loss and mild cognitive impairment and answered questions.  I recommend she start taking provision 10 mg capsule daily to help with cognitive impairment as well as participate in mentally challenging activities like solving crossword puzzles, playing bridge and sudoku.  She was advised to continue Keppra XR 500 mg daily for seizure prophylaxis and not to drive for 2 more months.   She will return for follow-up for in person visit in 2 months or call earlier if necessary.  Follow Up Instructions:    I discussed the assessment and treatment plan with the patient. The patient was provided an opportunity to ask questions and all were answered. The patient agreed with the plan and demonstrated an understanding of the instructions.   The patient was advised to call back or seek an in-person evaluation if the symptoms worsen or if the condition fails to improve as anticipated.  I provided 25 minutes of non-face-to-face time during this encounter.   Antony Contras, MD

## 2019-03-27 NOTE — Patient Instructions (Signed)
I advised the patient to continue participating in cognitively challenging activities like solving crossword puzzles, playing bridge and sudoku.  She was advised to start taking prevention 1 tablet daily to help with her mild cognitive impairment.  I also advised her to not to drive for 2 more months.  She will return for follow-up in the future in 2 months or call earlier if necessary.

## 2019-03-28 ENCOUNTER — Other Ambulatory Visit: Payer: Self-pay | Admitting: Family Medicine

## 2019-03-28 DIAGNOSIS — E032 Hypothyroidism due to medicaments and other exogenous substances: Secondary | ICD-10-CM

## 2019-04-03 ENCOUNTER — Ambulatory Visit: Payer: Medicare Other | Attending: Internal Medicine

## 2019-04-03 DIAGNOSIS — Z23 Encounter for immunization: Secondary | ICD-10-CM | POA: Insufficient documentation

## 2019-04-03 NOTE — Progress Notes (Signed)
   Covid-19 Vaccination Clinic  Name:  Patricia [name]    MRN: [id number] DOB: 09-30-1940  04/03/2019  Ms. [name] was observed post Covid-19 immunization for 30 minutes based on pre-vaccination screening without incidence. She was provided with Vaccine Information Sheet and instruction to access the V-Safe system.   Ms. [name] was instructed to call 911 with any severe reactions post vaccine: Marland Kitchen Difficulty breathing  . Swelling of your face and throat  . A fast heartbeat  . A bad rash all over your body  . Dizziness and weakness    Immunizations Administered    Name Date Dose VIS Date Route   Pfizer COVID-19 Vaccine 04/03/2019  9:16 AM 0.3 mL 02/10/2019 Intramuscular   Manufacturer: [place]   Lot: [id number]   [place]: [id number]

## 2019-04-04 ENCOUNTER — Other Ambulatory Visit: Payer: Self-pay | Admitting: Cardiology

## 2019-04-13 ENCOUNTER — Other Ambulatory Visit: Payer: Self-pay | Admitting: Family Medicine

## 2019-04-13 DIAGNOSIS — E559 Vitamin D deficiency, unspecified: Secondary | ICD-10-CM

## 2019-04-16 NOTE — Progress Notes (Signed)
[place]  [phone] [phone]     ID: Patricia [name] DOB: April 01, 1940  MR#: [id number]  PFX#:[id number]  Patient Care Team: Mellody Dance, DO as PCP - General (Family Medicine) Sueanne Margarita, MD as PCP - Cardiology (Cardiology) Constance Haw, MD as PCP - Electrophysiology (Cardiology) Thea Silversmith, MD as Consulting Physician (Radiation Oncology) Melaney Tellefsen, Virgie Dad, MD as Consulting Physician (Oncology) Stark Klein, MD as Consulting Physician (General Surgery) Ladene Artist, MD as Consulting Physician (Gastroenterology) Marygrace Drought, MD as Consulting Physician (Ophthalmology) Garvin Fila, MD as Consulting Physician (Neurology) Chauncey Cruel, MD OTHER MD:  CHIEF COMPLAINT: Estrogen receptor positive breast cancer  CURRENT TREATMENT: Completing 5 years of antiestrogens; denosumab/Prolia   INTERVAL HISTORY: Patricia [name] returns today for follow-up and treatment of her estrogen receptor positive breast cancer.   She continues on anastrozole.  She has no unusual side effects and hot flashes have become easier.  She does not feel she needs any intervention for this  She also continues on Prolia.  She tolerates this well. Patricia [name]'s last bone density screening on 07/22/2017 at [facility] showed a T score of -1.9.  Her most recent MRI was on 11/25/2017, and her most recent mammogram was on 08/04/2018 at [facility].  That showed breast density category D.  There was no evidence of malignancy.  Since her last visit, she presented to the ED on 11/28/2018 with possible strokelike event. She reports she ultimately left because the wait time was so long. When she saw her cardiologist on 11/30/2018, they recommended she return to the ED for evaluation. Per report, she went to get something in her house and apparently slumped against the wall and lay down on the floor. Her husband found her unresponsive with some possible garbled speech. 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  She was seen by neurology as an outpatient on 12/22/2018. Per Dr. Clydene Fake note, he believes she may have had a seizure as her recent brain MRI was negative. She was given a trial of Keppra and underwent EEG on 01/19/2019, which was normal. Per most recent video visit with Dr. Leonie Man on 03/27/2019, she continues on [place] and was started on provision 10 mg.   REVIEW OF SYSTEMS: Patricia "Areola" has been doing "fine".  She usually exercises by walking maybe 30 minutes may be 3 days a week, although of course with the recent weather she has not been doing that.  She also enjoys playing 9 holes of golf.  She has already received both her Covid vaccine shots with no complications.  He tells me family is doing fine.  A detailed review of systems today was otherwise stable   HISTORY OF CURRENT ILLNESS: From the initial intake note 06/14/2013:  Patricia [name] had screening mammography with tomography at [facility] 05/26/2013 showing some architectural distortion in the left breast superiorly. Ultrasound of the left breast 06/02/2013 showed a 2 cm irregular mass superiorly in the left breast, medial to the scar on the skin from a prior biopsy. This mass was palpable. 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  On 06/13/2013 the patient underwent bilateral breast MRI. This showed a 2.8 cm irregular enhancing mass in the superior left breast but no additional suspicious masses, and no  abnormal adenopathy, and no findings in the right breast.  The patient's subsequent history is as detailed below.   PAST MEDICAL HISTORY: Past Medical History:  Diagnosis Date  . Allergic rhinitis,  cause unspecified   . Aortic regurgitation 06/17/2015   Mild to moderate AR by echo 04/2015  . 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 Chronic cough   . Chronic diastolic heart failure ([place]) 06/17/2015  . CKD (chronic kidney disease), stage III   . Cough variant asthma   . Disease of pharynx or nasopharynx   . Diverticulosis   . Essential hypertension   . GERD (gastroesophageal reflux disease)   . Glaucoma   . Hiatal hernia   . Hyperlipidemia   . Internal hemorrhoids   . Laryngospasm   . Multinodular goiter   . Osteopenia   . Persistent atrial fibrillation ([place])    a. Dx 04/2015 at time of stroke. b. DCCV 06/2015 - did not hold. Amio started then stopped due to QT prolongation.  Marland Kitchen Postmenopausal   . Radiation 10/22/14-11/23/14   Left Breast  . Stricture and stenosis of cervix     PAST SURGICAL HISTORY: Past Surgical History:  Procedure Laterality Date  . BREAST SURGERY    . CARDIOVERSION N/A 06/24/2015   Procedure: CARDIOVERSION;  Surgeon: Sueanne Margarita, MD;  Location: [place];  Service: Cardiovascular;  Laterality: N/A;  . CARDIOVERSION N/A 05/19/2016   Procedure: CARDIOVERSION;  Surgeon: Sueanne Margarita, MD;  [facility];  Service: Cardiovascular;  Laterality: N/A;  . COLONOSCOPY    . LUNG CANCER SURGERY  2003   resection carcinoid lingula-lt upper lobe  . RADIOACTIVE SEED GUIDED PARTIAL MASTECTOMY WITH AXILLARY SENTINEL LYMPH NODE BIOPSY Left 06/26/2014   Procedure: RADIOACTIVE SEED GUIDED PARTIAL MASTECTOMY WITH AXILLARY SENTINEL LYMPH NODE BIOPSY;  Surgeon: Stark Klein, MD;  Location: [place];  Service: General;  Laterality: Left;  . RE-EXCISION OF BREAST LUMPECTOMY Left 08/07/2014   Procedure: RE-EXCISION OF LEFT BREAST LUMPECTOMY;  Surgeon: Stark Klein, MD;  Location: [place];  Service: General;  Laterality: Left;  . RIGHT HEART CATH N/A 04/27/2016   Procedure: Right Heart  Cath;  Surgeon: Larey Dresser, MD;  Location: [place] [facility];  Service: Cardiovascular;  Laterality: N/A;    FAMILY HISTORY: Family History  Problem Relation Age of Onset  . Stroke Mother   . Hypertension Mother   . Hyperlipidemia Mother   . Stroke Father   . Transient ischemic attack Father   . Hyperlipidemia Father   . Hypertension Father   . Atrial fibrillation Son   . Heart attack Neg Hx   The patient's father died from pneumonia the age of 53, in the setting of dementia. The patient's mother died at the age of 44 following a stroke. The patient had one brother, 4 sisters. There is no history of breast or ovarian cancer in the family   GYNECOLOGIC HISTORY:  No LMP recorded. Patient is postmenopausal. Menarche: 80 years old Age at first live birth: 79 years old Patricia [name] P 2 Contraceptive: never used HRT never used  Hysterectomy? no BSO? 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 Her husband Patricia [name] is retired from the [employer] and then worked for [employer]. He is an avid Animator. Son Patricia [name] lives in  [place] and is an Art gallery manager. Daughter Patricia [name] lives in [place] and is a Astronomer. The patient has 5 grandchildren. She attends the [employer]    ADVANCED DIRECTIVES: In place   HEALTH MAINTENANCE: Social History   Tobacco Use  . Smoking status: Former Smoker    Packs/day: 0.75    Years: 5.00    Pack years: 3.75    Quit date: 06/14/1961    Years since quitting: 57.8  . Smokeless tobacco: Never Used  Substance Use Topics  . Alcohol use: Yes    Alcohol/week: 0.0 standard drinks    Comment: occasional wine  . Drug use: No     Colonoscopy: 03/2007  PAP: 06/2010  Bone density: 06/2017, -1.9 ([facility])   Allergies  Allergen Reactions  . Combigan [Brimonidine Tartrate-Timolol] Itching    Eyes itch, reddened  . Other Other (See Comments)    Per patient made  OU red, Sore, and sensitivity to light  . Sulfa Antibiotics Rash  . Sulfamethoxazole-Trimethoprim Hives    Current Outpatient Medications  Medication Sig Dispense Refill  . albuterol (PROVENTIL HFA;VENTOLIN HFA) 108 (90 Base) MCG/ACT inhaler Inhale 2 puffs into the lungs every 6 (six) hours as needed for wheezing or shortness of breath.    . anastrozole (ARIMIDEX) 1 MG tablet TAKE 1 TABLET DAILY 90 tablet 3  . Apoaequorin (PREVAGEN) 10 MG CAPS Take 1 capsule by mouth every morning. 1 capsule 1  . ARMOUR THYROID 60 MG tablet TAKE 1 TABLET DAILY BEFORE BREAKFAST 90 tablet 1  . atorvastatin (LIPITOR) 40 MG tablet TAKE 1 TABLET DAILY 90 tablet 3  . calcium-vitamin D (OSCAL WITH D) 500-200 MG-UNIT tablet Take 1 tablet by mouth daily with breakfast. 30 tablet 0  . digoxin (LANOXIN) 0.125 MG tablet Take 1 tablet (0.125 mg total) by mouth daily. 90 tablet 2  . diltiazem (CARDIZEM CD) 180 MG 24 hr capsule Take 1 capsule (180 mg total) by mouth daily. 90 capsule 1  . ELIQUIS 5 MG TABS tablet TAKE 1 TABLET TWICE A DAY 180 tablet 3  . fluticasone (FLONASE) 50 MCG/ACT nasal spray 1 spray each nostril following sinus rinses twice daily 16 g 2  . fluticasone-salmeterol (ADVAIR HFA) 230-21 MCG/ACT inhaler USE 2 INHALATIONS TWICE A DAY 36 g 4  . furosemide (LASIX) 20 MG tablet TAKE 2 TABLETS EVERY MORNING AND 1 TABLET EVERY EVENING (NEED APPOINTMENT, 2ND ATTEMPT) 90 tablet 1  . gabapentin (NEURONTIN) 400 MG capsule TAKE 1 CAPSULE TWICE A DAY 180 capsule 1  . levETIRAcetam (KEPPRA XR) 500 MG 24 hr tablet Take 1 tablet (500 mg total) by mouth daily. 90 tablet 1  . Multiple Vitamins-Iron (MULTIVITAMIN/IRON) TABS Take 1 tablet by mouth daily.     Marland Kitchen omeprazole (PRILOSEC) 20 MG capsule TAKE 1 CAPSULE TWICE A DAY 30 MINUTES BEFORE MEALS (BEFORE LUNCH AND DINNER) 180 capsule 3  . potassium chloride SA (KLOR-CON) 20 MEQ tablet TAKE 1 TABLET TWICE A DAY 180 tablet 1  . Vitamin D, Ergocalciferol, (DRISDOL) 1.25 MG  (50000 UNIT) CAPS capsule TAKE 1 CAPSULE EVERY WEDNESDAY AND SUNDAY 24 capsule 3   No current facility-administered medications for this visit.   Facility-Administered Medications Ordered in Other Visits  Medication Dose Route Frequency Provider Last Rate Last Admin  . denosumab (PROLIA) injection 60 mg  60 mg Subcutaneous Once Laretta Pyatt, Virgie Dad, MD        OBJECTIVE: Middle-aged white woman  in no acute distress  Vitals:   04/17/19 1429  BP: 116/73  Pulse: 80  Resp: 16  Temp: 98 F (36.7 C)  SpO2: 99%     Body mass index is 25.27 kg/m.   Wt Readings from Last 3 Encounters:  04/17/19 129 lb 6.4 oz (58.7 kg)  12/22/18 127 lb 9.6 oz (57.9 kg)  12/05/18 132 lb (59.9 kg)      ECOG FS:1 - Symptomatic but completely ambulatory  Ocular: Sclerae unicteric, pupils round and equal Ear-nose-throat: Wearing a mask Lymphatic: No cervical or supraclavicular adenopathy Lungs no rales or rhonchi Heart regular rate and rhythm Abd soft, nontender, positive bowel sounds MSK no focal spinal tenderness, no joint edema Neuro: non-focal, well-oriented, appropriate affect Breasts: The right breast is unremarkable.  The left breast is status post lumpectomy followed by radiation with no evidence of disease recurrence.  Both axillae are benign.  LAB RESULTS:  CMP     Component Value Date/Time   NA 138 12/05/2018 0000   NA 141 09/22/2016 1459   K 4.0 12/05/2018 0000   K 3.8 09/22/2016 1459   CL 96 12/05/2018 0000   CO2 25 12/05/2018 0000   CO2 26 09/22/2016 1459   GLUCOSE 79 12/05/2018 0000   GLUCOSE 134 (H) 11/30/2018 1104   GLUCOSE 116 09/22/2016 1459   GLUCOSE 88 01/13/2006 1012   BUN 15 12/05/2018 0000   BUN 19.8 09/22/2016 1459   CREATININE 0.91 12/05/2018 0000   CREATININE 1.09 (H) 10/31/2018 1412   CREATININE 1.1 09/22/2016 1459   CALCIUM 9.5 12/05/2018 0000   CALCIUM 9.2 09/22/2016 1459   PROT 7.4 11/30/2018 1104   PROT 6.8 11/05/2017 0842   PROT 7.1 09/22/2016 1459    ALBUMIN 3.4 (L) 11/30/2018 1104   ALBUMIN 4.4 11/05/2017 0842   ALBUMIN 3.8 09/22/2016 1459   AST 23 11/30/2018 1104   AST 25 10/31/2018 1412   AST 15 09/22/2016 1459   ALT 29 11/30/2018 1104   ALT 36 10/31/2018 1412   ALT 21 09/22/2016 1459   ALKPHOS 71 11/30/2018 1104   ALKPHOS 98 09/22/2016 1459   BILITOT 1.7 (H) 11/30/2018 1104   BILITOT 0.7 10/31/2018 1412   BILITOT 0.65 09/22/2016 1459   GFRNONAA 61 12/05/2018 0000   GFRNONAA 49 (L) 10/31/2018 1412   GFRAA 70 12/05/2018 0000   GFRAA 56 (L) 10/31/2018 1412    No results found for: TOTALPROTELP, ALBUMINELP, A1GS, A2GS, BETS, BETA2SER, GAMS, MSPIKE, SPEI  Lab Results  Component Value Date   WBC 9.7 12/05/2018   NEUTROABS 10.1 (H) 11/30/2018   HGB 13.8 12/05/2018   HCT 39.2 12/05/2018   MCV 90 12/05/2018   PLT 441 12/05/2018    No results found for: LABCA2  No components found for: [id number]  No results for input(s): INR in the last 168 hours.  No results found for: LABCA2  No results found for: XHB716  No results found for: RCV893  No results found for: YBO175  No results found for: CA2729  No components found for: HGQUANT  No results found for: CEA1 / No results found for: CEA1   No results found for: AFPTUMOR  No results found for: CHROMOGRNA  No results found for: KPAFRELGTCHN, LAMBDASER, KAPLAMBRATIO (kappa/lambda light chains)  No results found for: HGBA, HGBA2QUANT, HGBFQUANT, HGBSQUAN (Hemoglobinopathy evaluation)   No results found for: LDH  No results found for: IRON, TIBC, IRONPCTSAT (Iron and TIBC)  No results found for: FERRITIN  Urinalysis    Component Value Date/Time   COLORURINE YELLOW 11/30/2018 1104   APPEARANCEUR  CLEAR 11/30/2018 1104   LABSPEC 1.012 11/30/2018 1104   PHURINE 6.0 11/30/2018 1104   GLUCOSEU NEGATIVE 11/30/2018 1104   [place] 11/30/2018 1104   HGBUR negative 05/20/2007 0942   BILIRUBINUR negative 12/05/2018 [place] 11/30/2018  1104   PROTEINUR Negative 12/05/2018 [place] 11/30/2018 1104   UROBILINOGEN 1.0 12/05/2018 1540   UROBILINOGEN 0.2 05/20/2007 0942   NITRITE negative 12/05/2018 1540   NITRITE NEGATIVE 11/30/2018 1104   LEUKOCYTESUR Small (1+) (A) 12/05/2018 1540   LEUKOCYTESUR LARGE (A) 11/30/2018 1104     STUDIES: No results found.   ELIGIBLE FOR AVAILABLE RESEARCH PROTOCOL: No  ASSESSMENT: 79 y.o. [place] woman status post left breast upper outer quadrant biopsy 08/19/2013 for a clinical T2 N0, stage IIA invasive lobular breast cancer, estrogen receptor positive, progesterone receptor negative, with an MIB-1 of 20%. There was not sufficient tissue for HER-2 testing.  (1) neoadjuvant anastrozole started mid April 2015, discontinued early June 2015 with poor tolerance  (2) tamoxifen started first week in July 2015. Discontinued 06/20/15 after stroke.             (a) anastrozole resumed  07/01/2015, discontinued February 2021              (3) osteopenia with a T score of -1.8 at the femoral necks 07/12/2013             (a) [facility] density at [facility]  07/09/2015 shows T score of -2.0             (b) Prolia started 09/24/2015, last dose February 2021             (c)  bone density on 07/22/2017 showed a T score of -1.9  (4) left lumpectomy 06/26/2014 showed a pT1c pN0, stage IA invasive lobular carcinoma, grade 1, HER-2 negative, with positive margins              (a) additional surgery 08/07/2014 did not completely clear margins, but no further surgery was advised  (5) radiation completed 10/22/2014-11/23/2014  (a) Left breast/ 42.72 Gy at 2.67 Gy per fraction x 16 fractions.   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  This is very favorable.  She has a little over than 5 years on aromatase inhibitors and I am comfortable with her stopping anastrozole now.  She has tolerated it well.  Despite being on anastrozole she still has density D breasts.  This means that mammography is just not very sensitive.  I think she will benefit from breast MRI at least every other year.  I am setting her up for MRI at this April and then 6 months later mammography in October.  She could then continue with mammography in October and 2022 and repeat breast MRI April 2023 with continuing yearly mammography in October.  She will receive Prolia this week.  I am not scheduling her for further doses here as she is "graduating" after today's visit  At this point I feel comfortable releasing her to her primary care  physician.  She will need in terms of breast cancer follow-up is the yearly mammography with the every other year MRI as noted above, and a yearly physician breast exam  I will be glad to see Patricia [name] at any point in the future if and when the need arises but as of now are making no further routine appointments for her here.  Total encounter time 30 minutes.Chauncey Cruel, MD   04/17/2019 2:50 PM Medical Oncology and Hematology [facility] [place], [place] [address] Tel. [phone]    Fax. [phone]   I, Wilburn Mylar, am acting as scribe for Dr. Virgie Dad. Patricia [name].  I, Lurline Del MD, have reviewed the above documentation for accuracy and completeness, and I agree with the above.    *Total Encounter Time as defined by the Centers for Medicare and Medicaid Services includes, in addition to the face-to-face time of a patient visit (documented in the note above) non-face-to-face time: obtaining and reviewing outside history, ordering and reviewing medications, tests or  procedures, care coordination (communications with other health care professionals or caregivers) and documentation in the medical record.

## 2019-04-17 ENCOUNTER — Inpatient Hospital Stay: Payer: Medicare Other | Attending: Oncology | Admitting: Oncology

## 2019-04-17 ENCOUNTER — Inpatient Hospital Stay: Payer: Medicare Other

## 2019-04-17 ENCOUNTER — Other Ambulatory Visit: Payer: Self-pay

## 2019-04-17 ENCOUNTER — Other Ambulatory Visit: Payer: Self-pay | Admitting: *Deleted

## 2019-04-17 ENCOUNTER — Telehealth: Payer: Self-pay | Admitting: Oncology

## 2019-04-17 VITALS — BP 116/73 | HR 80 | Temp 98.0°F | Resp 16 | Ht 60.0 in | Wt 129.4 lb

## 2019-04-17 DIAGNOSIS — M8588 Other specified disorders of bone density and structure, other site: Secondary | ICD-10-CM

## 2019-04-17 DIAGNOSIS — C50912 Malignant neoplasm of unspecified site of left female breast: Secondary | ICD-10-CM | POA: Insufficient documentation

## 2019-04-17 DIAGNOSIS — E032 Hypothyroidism due to medicaments and other exogenous substances: Secondary | ICD-10-CM | POA: Diagnosis not present

## 2019-04-17 DIAGNOSIS — Z170 Estrogen receptor positive status [ER+]: Secondary | ICD-10-CM | POA: Insufficient documentation

## 2019-04-17 DIAGNOSIS — Z79811 Long term (current) use of aromatase inhibitors: Secondary | ICD-10-CM | POA: Diagnosis not present

## 2019-04-17 DIAGNOSIS — C3410 Malignant neoplasm of upper lobe, unspecified bronchus or lung: Secondary | ICD-10-CM | POA: Diagnosis not present

## 2019-04-17 DIAGNOSIS — M8580 Other specified disorders of bone density and structure, unspecified site: Secondary | ICD-10-CM | POA: Insufficient documentation

## 2019-04-17 DIAGNOSIS — C50412 Malignant neoplasm of upper-outer quadrant of left female breast: Secondary | ICD-10-CM | POA: Diagnosis not present

## 2019-04-17 DIAGNOSIS — I6389 Other cerebral infarction: Secondary | ICD-10-CM

## 2019-04-17 DIAGNOSIS — E782 Mixed hyperlipidemia: Secondary | ICD-10-CM

## 2019-04-17 DIAGNOSIS — G40909 Epilepsy, unspecified, not intractable, without status epilepticus: Secondary | ICD-10-CM

## 2019-04-17 NOTE — Addendum Note (Signed)
Addended by: Laureen Abrahams on: 04/17/2019 03:14 PM   Modules accepted: Orders

## 2019-04-17 NOTE — Telephone Encounter (Signed)
I talk with patient regarding 2/16

## 2019-04-18 ENCOUNTER — Other Ambulatory Visit: Payer: Self-pay | Admitting: Cardiology

## 2019-04-18 ENCOUNTER — Telehealth: Payer: Self-pay | Admitting: Oncology

## 2019-04-18 ENCOUNTER — Ambulatory Visit: Payer: Medicare Other

## 2019-04-18 DIAGNOSIS — I5033 Acute on chronic diastolic (congestive) heart failure: Secondary | ICD-10-CM

## 2019-04-18 DIAGNOSIS — H2513 Age-related nuclear cataract, bilateral: Secondary | ICD-10-CM | POA: Diagnosis not present

## 2019-04-18 DIAGNOSIS — H401133 Primary open-angle glaucoma, bilateral, severe stage: Secondary | ICD-10-CM | POA: Diagnosis not present

## 2019-04-18 MED ORDER — FUROSEMIDE 20 MG PO TABS: ORAL_TABLET | ORAL | 1 refills | Status: DC

## 2019-04-18 NOTE — Telephone Encounter (Signed)
Scheduled appt per 2/16 sch message - unable to reach pt .left message with appt date and time

## 2019-04-18 NOTE — Telephone Encounter (Signed)
Patient called in to reschedule injection

## 2019-04-19 ENCOUNTER — Inpatient Hospital Stay: Payer: Medicare Other

## 2019-04-19 ENCOUNTER — Other Ambulatory Visit: Payer: Self-pay

## 2019-04-19 VITALS — BP 118/72 | HR 72 | Temp 98.2°F | Resp 18

## 2019-04-19 DIAGNOSIS — M8588 Other specified disorders of bone density and structure, other site: Secondary | ICD-10-CM

## 2019-04-19 DIAGNOSIS — Z79811 Long term (current) use of aromatase inhibitors: Secondary | ICD-10-CM | POA: Diagnosis not present

## 2019-04-19 DIAGNOSIS — C50912 Malignant neoplasm of unspecified site of left female breast: Secondary | ICD-10-CM | POA: Diagnosis not present

## 2019-04-19 DIAGNOSIS — Z170 Estrogen receptor positive status [ER+]: Secondary | ICD-10-CM | POA: Diagnosis not present

## 2019-04-19 DIAGNOSIS — C50412 Malignant neoplasm of upper-outer quadrant of left female breast: Secondary | ICD-10-CM

## 2019-04-19 DIAGNOSIS — M8580 Other specified disorders of bone density and structure, unspecified site: Secondary | ICD-10-CM

## 2019-04-19 LAB — CMP (CANCER CENTER ONLY)
ALT: 26 U/L (ref 0–44)
AST: 14 U/L — ABNORMAL LOW (ref 15–41)
Albumin: 3.7 g/dL (ref 3.5–5.0)
Alkaline Phosphatase: 90 U/L (ref 38–126)
Anion gap: 11 (ref 5–15)
BUN: 21 mg/dL (ref 8–23)
CO2: 28 mmol/L (ref 22–32)
Calcium: 9.3 mg/dL (ref 8.9–10.3)
Chloride: 102 mmol/L (ref 98–111)
Creatinine: 1.15 mg/dL — ABNORMAL HIGH (ref 0.44–1.00)
GFR, Est AFR Am: 53 mL/min — ABNORMAL LOW (ref >60)
GFR, Estimated: 46 mL/min — ABNORMAL LOW (ref >60)
Glucose, Bld: 212 mg/dL — ABNORMAL HIGH (ref 70–99)
Potassium: 3.7 mmol/L (ref 3.5–5.1)
Sodium: 141 mmol/L (ref 135–145)
Total Bilirubin: 0.6 mg/dL (ref 0.3–1.2)
Total Protein: 7 g/dL (ref 6.5–8.1)

## 2019-04-19 LAB — CBC WITH DIFFERENTIAL (CANCER CENTER ONLY)
Abs Immature Granulocytes: 0.03 10*3/uL (ref 0.00–0.07)
Basophils Absolute: 0.1 10*3/uL (ref 0.0–0.1)
Basophils Relative: 1 %
Eosinophils Absolute: 0 10*3/uL (ref 0.0–0.5)
Eosinophils Relative: 0 %
HCT: 42.7 % (ref 36.0–46.0)
Hemoglobin: 14.2 g/dL (ref 12.0–15.0)
Immature Granulocytes: 1 %
Lymphocytes Relative: 25 %
Lymphs Abs: 1.6 10*3/uL (ref 0.7–4.0)
MCH: 30.4 pg (ref 26.0–34.0)
MCHC: 33.3 g/dL (ref 30.0–36.0)
MCV: 91.4 fL (ref 80.0–100.0)
Monocytes Absolute: 0.5 10*3/uL (ref 0.1–1.0)
Monocytes Relative: 8 %
Neutro Abs: 4.3 10*3/uL (ref 1.7–7.7)
Neutrophils Relative %: 65 %
Platelet Count: 238 10*3/uL (ref 150–400)
RBC: 4.67 MIL/uL (ref 3.87–5.11)
RDW: 13.5 % (ref 11.5–15.5)
WBC Count: 6.5 10*3/uL (ref 4.0–10.5)
nRBC: 0 % (ref 0.0–0.2)

## 2019-04-19 MED ORDER — DENOSUMAB 60 MG/ML ~~LOC~~ SOSY
60.0000 mg | PREFILLED_SYRINGE | Freq: Once | SUBCUTANEOUS | Status: AC
  Administered 2019-04-19: 60 mg via SUBCUTANEOUS

## 2019-04-19 MED ORDER — DENOSUMAB 60 MG/ML ~~LOC~~ SOSY
PREFILLED_SYRINGE | SUBCUTANEOUS | Status: AC
  Filled 2019-04-19: qty 1

## 2019-04-19 NOTE — Patient Instructions (Signed)

## 2019-05-23 DIAGNOSIS — H04123 Dry eye syndrome of bilateral lacrimal glands: Secondary | ICD-10-CM | POA: Diagnosis not present

## 2019-05-23 DIAGNOSIS — H401133 Primary open-angle glaucoma, bilateral, severe stage: Secondary | ICD-10-CM | POA: Diagnosis not present

## 2019-05-23 DIAGNOSIS — H2513 Age-related nuclear cataract, bilateral: Secondary | ICD-10-CM | POA: Diagnosis not present

## 2019-05-23 DIAGNOSIS — H25043 Posterior subcapsular polar age-related cataract, bilateral: Secondary | ICD-10-CM | POA: Diagnosis not present

## 2019-05-29 ENCOUNTER — Telehealth: Payer: Self-pay | Admitting: Neurology

## 2019-05-29 NOTE — Telephone Encounter (Signed)
Pt called wanting to know if she has to wait until her apt with Dr.Sethi to drive or if she can begin driving before then.

## 2019-05-29 NOTE — Telephone Encounter (Signed)
She can drive as long as she has not had any further episodes of confusion or disorientation in the last 6 months

## 2019-05-30 NOTE — Telephone Encounter (Signed)
I called pt that per Dr.Sethi she can drive as long as she has not had any episodes of confusion or disorientation in the last 6 months. Pt stated she has not had any of the episodes listed above and verbalized understanding of being able to drive.

## 2019-06-12 ENCOUNTER — Inpatient Hospital Stay: Admission: RE | Admit: 2019-06-12 | Payer: Medicare Other | Source: Ambulatory Visit

## 2019-06-12 ENCOUNTER — Other Ambulatory Visit: Payer: Medicare Other

## 2019-06-14 ENCOUNTER — Other Ambulatory Visit: Payer: Self-pay

## 2019-06-14 ENCOUNTER — Ambulatory Visit
Admission: RE | Admit: 2019-06-14 | Discharge: 2019-06-14 | Disposition: A | Discharge status: Home / Self Care | Payer: Medicare Other | Source: Ambulatory Visit | Attending: Oncology | Admitting: Oncology

## 2019-06-14 DIAGNOSIS — E032 Hypothyroidism due to medicaments and other exogenous substances: Secondary | ICD-10-CM

## 2019-06-14 DIAGNOSIS — C3410 Malignant neoplasm of upper lobe, unspecified bronchus or lung: Secondary | ICD-10-CM

## 2019-06-14 DIAGNOSIS — E782 Mixed hyperlipidemia: Secondary | ICD-10-CM

## 2019-06-14 DIAGNOSIS — C50412 Malignant neoplasm of upper-outer quadrant of left female breast: Secondary | ICD-10-CM

## 2019-06-14 DIAGNOSIS — Z1231 Encounter for screening mammogram for malignant neoplasm of breast: Secondary | ICD-10-CM | POA: Diagnosis not present

## 2019-06-14 DIAGNOSIS — M8580 Other specified disorders of bone density and structure, unspecified site: Secondary | ICD-10-CM

## 2019-06-14 DIAGNOSIS — G40909 Epilepsy, unspecified, not intractable, without status epilepticus: Secondary | ICD-10-CM

## 2019-06-22 ENCOUNTER — Other Ambulatory Visit: Payer: Self-pay

## 2019-06-22 ENCOUNTER — Ambulatory Visit (INDEPENDENT_AMBULATORY_CARE_PROVIDER_SITE_OTHER): Payer: Medicare Other | Admitting: Neurology

## 2019-06-22 ENCOUNTER — Encounter: Payer: Self-pay | Admitting: Neurology

## 2019-06-22 VITALS — BP 120/75 | HR 76 | Temp 98.0°F | Ht 60.0 in | Wt 131.0 lb

## 2019-06-22 DIAGNOSIS — I6389 Other cerebral infarction: Secondary | ICD-10-CM

## 2019-06-22 DIAGNOSIS — R413 Other amnesia: Secondary | ICD-10-CM | POA: Diagnosis not present

## 2019-06-22 MED ORDER — CEREFOLIN 6-1-50-5 MG PO TABS: 1.0000 | ORAL_TABLET | Freq: Every morning | ORAL | 3 refills | Status: DC

## 2019-06-22 NOTE — Patient Instructions (Signed)
I had a long discussion with the patient and husband regarding episode of confusion, memory loss and mild cognitive impairment and answered questions.  I recommend she continue Prevagen 10 mg capsule daily as well as start Cerefolin NAC 1 capsule daily to help with cognitive impairment as well as participate in mentally challenging activities like solving crossword puzzles, playing bridge and sudoku.  She may also consider possible participation and trailblazer early dementia trial if interested given her family history of dementia .  We also discussed memory compensation strategies.  She was advised to continue Keppra XR 500 mg daily for seizure prophylaxis and continue Eliquis for stroke prevention for atrial fibrillation and maintain aggressive risk factor modification. She will return for follow-up for in person visit in 6 months or call earlier if necessary. Memory Compensation Strategies  1. Use "WARM" strategy.  W= write it down  A= associate it  R= repeat it  M= make a mental note  2.   You can keep a Social worker.  Use a 3-ring notebook with sections for the following: calendar, important names and phone numbers,  medications, doctors' names/phone numbers, lists/reminders, and a section to journal what you did  each day.   3.    Use a calendar to write appointments down.  4.    Write yourself a schedule for the day.  This can be placed on the calendar or in a separate section of the Memory Notebook.  Keeping a  regular schedule can help memory.  5.    Use medication organizer with sections for each day or morning/evening pills.  You may need help loading it  6.    Keep a basket, or pegboard by the door.  Place items that you need to take out with you in the basket or on the pegboard.  You may also want to  include a message board for reminders.  7.    Use sticky notes.  Place sticky notes with reminders in a place where the task is performed.  For example: " turn off the  stove"  placed by the stove, "lock the door" placed on the door at eye level, " take your medications" on  the bathroom mirror or by the place where you normally take your medications.  8.    Use alarms/timers.  Use while cooking to remind yourself to check on food or as a reminder to take your medicine, or as a  reminder to make a call, or as a reminder to perform another task, etc.

## 2019-06-22 NOTE — Progress Notes (Signed)
[facility] [address] [place]. [place] [address] [phone]       OFFICE FOLLOW-UP NOTE  Patricia [name] Date of Birth:  04-01-1940 Medical Record Number:  [id number]   HPI: Initial visit 03/27/2019 [name] is a 79 year old pleasant Caucasian lady with past medical history of left breast cancer with radiation, persistent atrial fibrillation on Eliquis, multinodular goiter, laryngeal spasms, essential hypertension, chronic kidney disease, diastolic heart failure, and hyperlipidemia who was last seen in office on 11/19/2016.  She is seen today for follow-up following recent ER visit on November 30, 2018.  She was doing well until 2 days prior when the husband sent her to another room at home to get her checkbook.  The patient recalled trying to move a rolling chair out of her way and suddenly she fell backwards leaning on the wall and slid down to the floor.  The husband arrived a few minutes later and noted that she was unresponsive and was not speaking or answering his questions.  He called EMS and by the time they arrived patient was gradually returning back to normal.  She could not remember the episode and did not have a memory of the event.  She denied any chest pain, sweating, palpitations, headache, tongue bite injury or incontinence.  CT scan of the head was unremarkable and MRI scan showed no evidence of acute stroke or hemorrhage.  EEG showed bitemporal focal slowing.  Patient was on Eliquis for atrial fibrillation and prior stroke which was continued.  She states she is done well since then and has not had any further episodes of office consciousness or seizures.  The patient's husband however feels that is noticed that her memory is not the same and she has had some cognitive decline.  There have been no episodes of confusion, disorientation or agitation.  She has not had any delusions hallucinations.  She has not had any lab work to look for reversible causes of  cognitive impairment.  She denies any headache, falls or injuries.  She feels her balance may be slightly off. Update 06/22/2019 : She returns for follow-up after last visit 6 months ago. She is accompanied by her husband. She states that him memory difficulties are about unchanged. She is started taking Prevagen 10 mg 1 capsule daily and tolerating it well. She has had no further episodes of confusion or seizure-like episode and she remains on Keppra Exar 20 mg daily which is tolerating well without side effects. She has had no recurrent stroke or TIAs either. She remains on Eliquis and is tolerating it well with minor bruising but no bleeding. Blood pressure is well controlled though it is elevated today in office. She has been participating in mentally challenging activities and she does have family history of Alzheimer's and worries about it and wants me to try something else to help  ROS:   14 system review of systems is positive for  , confusion, memory loss, disorientation and all other systems negative  PMH:  Past Medical History:  Diagnosis Date  . Allergic rhinitis, cause unspecified   . Aortic regurgitation 06/17/2015   Mild to moderate AR by echo 04/2015  . 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 Chronic cough   . Chronic diastolic heart failure ([place]) 06/17/2015  . CKD (chronic kidney disease), stage III   . Cough variant asthma   . Disease of pharynx  or nasopharynx   . Diverticulosis   . Essential hypertension   . GERD (gastroesophageal reflux disease)   . Glaucoma   . Hiatal hernia   . Hyperlipidemia   . Internal hemorrhoids   . Laryngospasm   . Multinodular goiter   . Osteopenia   . Persistent atrial fibrillation ([place])    a. Dx 04/2015 at time of stroke. b. DCCV 06/2015 - did not hold. Amio started then stopped due to QT prolongation.  Marland Kitchen Postmenopausal   . Radiation 10/22/14-11/23/14   Left Breast  . Stricture and stenosis of  cervix     Social History:  Social History   Socioeconomic History  . Marital status: Married    Spouse name: Not on file  . Number of children: Not on file  . Years of education: Not on file  . Highest education level: Not on file  Occupational History  . Not on file  Tobacco Use  . Smoking status: Former Smoker    Packs/day: 0.75    Years: 5.00    Pack years: 3.75    Quit date: 06/14/1961    Years since quitting: 58.0  . Smokeless tobacco: Never Used  Substance and Sexual Activity  . Alcohol use: Yes    Alcohol/week: 0.0 standard drinks    Comment: occasional wine  . Drug use: No  . Sexual activity: Not Currently  Other Topics Concern  . Not on file  Social History Narrative   Retired. Lives with husband.    Social Determinants of Health   Financial Resource Strain:   . Difficulty of Paying Living Expenses:   Food Insecurity:   . Worried About Charity fundraiser in the Last Year:   . Arboriculturist in the Last Year:   Transportation Needs:   . Film/video editor (Medical):   Marland Kitchen Lack of Transportation (Non-Medical):   Physical Activity:   . Days of Exercise per Week:   . Minutes of Exercise per Session:   Stress:   . Feeling of Stress :   Social Connections:   . Frequency of Communication with Friends and Family:   . Frequency of Social Gatherings with Friends and Family:   . Attends Religious Services:   . Active Member of Clubs or Organizations:   . Attends Archivist Meetings:   Marland Kitchen Marital Status:   Intimate Partner Violence:   . Fear of Current or Ex-Partner:   . Emotionally Abused:   Marland Kitchen Physically Abused:   . Sexually Abused:     Medications:   Current Outpatient Medications on File Prior to Visit  Medication Sig Dispense Refill  . albuterol (PROVENTIL HFA;VENTOLIN HFA) 108 (90 Base) MCG/ACT inhaler Inhale 2 puffs into the lungs every 6 (six) hours as needed for wheezing or shortness of breath.    . anastrozole (ARIMIDEX) 1 MG tablet  TAKE 1 TABLET DAILY 90 tablet 3  . Apoaequorin (PREVAGEN) 10 MG CAPS Take 1 capsule by mouth every morning. 1 capsule 1  . ARMOUR THYROID 60 MG tablet TAKE 1 TABLET DAILY BEFORE BREAKFAST 90 tablet 1  . atorvastatin (LIPITOR) 40 MG tablet TAKE 1 TABLET DAILY 90 tablet 3  . calcium-vitamin D (OSCAL WITH D) 500-200 MG-UNIT tablet Take 1 tablet by mouth daily with breakfast. 30 tablet 0  . digoxin (LANOXIN) 0.125 MG tablet Take 1 tablet (0.125 mg total) by mouth daily. 90 tablet 2  . diltiazem (CARDIZEM CD) 180 MG 24 hr capsule Take 1 capsule (180  mg total) by mouth daily. 90 capsule 1  . ELIQUIS 5 MG TABS tablet TAKE 1 TABLET TWICE A DAY 180 tablet 3  . fluticasone (FLONASE) 50 MCG/ACT nasal spray 1 spray each nostril following sinus rinses twice daily 16 g 2  . fluticasone-salmeterol (ADVAIR HFA) 230-21 MCG/ACT inhaler USE 2 INHALATIONS TWICE A DAY 36 g 4  . furosemide (LASIX) 20 MG tablet TAKE 2 TABLETS BY MOUTH EVERY MORNING AND 1 TABLET BY MOUTH EVERY EVENING 270 tablet 1  . gabapentin (NEURONTIN) 400 MG capsule TAKE 1 CAPSULE TWICE A DAY 180 capsule 1  . levETIRAcetam (KEPPRA XR) 500 MG 24 hr tablet Take 1 tablet (500 mg total) by mouth daily. 90 tablet 1  . Multiple Vitamins-Iron (MULTIVITAMIN/IRON) TABS Take 1 tablet by mouth daily.     Marland Kitchen omeprazole (PRILOSEC) 20 MG capsule TAKE 1 CAPSULE TWICE A DAY 30 MINUTES BEFORE MEALS (BEFORE LUNCH AND DINNER) 180 capsule 3  . potassium chloride SA (KLOR-CON) 20 MEQ tablet TAKE 1 TABLET TWICE A DAY 180 tablet 1  . Vitamin D, Ergocalciferol, (DRISDOL) 1.25 MG (50000 UNIT) CAPS capsule TAKE 1 CAPSULE EVERY WEDNESDAY AND SUNDAY 24 capsule 3   Current Facility-Administered Medications on File Prior to Visit  Medication Dose Route Frequency Provider Last Rate Last Admin  . denosumab (PROLIA) injection 60 mg  60 mg Subcutaneous Once Magrinat, Virgie Dad, MD        Allergies:   Allergies  Allergen Reactions  . Combigan [Brimonidine Tartrate-Timolol]  Itching    Eyes itch, reddened  . Other Other (See Comments)    Per patient made OU red, Sore, and sensitivity to light  . Sulfa Antibiotics Rash  . Sulfamethoxazole-Trimethoprim Hives    Physical Exam General: Petite elderly Caucasian lady, seated, in no evident distress Head: head normocephalic and atraumatic.  Neck: supple with no carotid or supraclavicular bruits Cardiovascular: regular rate and rhythm, no murmurs Musculoskeletal: Mild kyphoscoliosis Skin:  no rash/petichiae Vascular:  Normal pulses all extremities Vitals:   06/22/19 1453  BP: 120/75  Pulse: 76  Temp: 98 F (36.7 C)   Neurologic Exam Mental Status: Awake and fully alert. Oriented to place and time. Recent and remote memory diminished. Attention span, concentration and fund of knowledge appropriate. Mood and affect appropriate.  Diminished recall 2/3.  Able to name only 5 animals which can walk on 4 legs.  Clock drawing 4/4. Cranial Nerves: Fundoscopic exam reveals sharp disc margins. Pupils equal, briskly reactive to light. Extraocular movements full without nystagmus. Visual fields full to confrontation. Hearing intact. Facial sensation intact. Face, tongue, palate moves normally and symmetrically.  Motor: Normal bulk and tone. Normal strength in all tested extremity muscles. Sensory.: intact to touch ,pinprick .position and vibratory sensation.  Coordination: Rapid alternating movements normal in all extremities. Finger-to-nose and heel-to-shin performed accurately bilaterally. Gait and Station: Arises from chair without difficulty. Stance is normal. Gait demonstrates normal stride length and balance . Able to heel, toe and tandem walk with moderate difficulty.  Reflexes: 1+ and symmetric. Toes downgoing.       ASSESSMENT: 79 year old Caucasian lady with remote history of right MCA infarct from atrial fibrillation in February 2017 with vascular risk factors of hypertension hyperlipidemia.  Recent episode of  brief loss of consciousness followed by confusion possibly unwitnessed seizure with postictal confusion in September 2020.  Also new complaints of memory loss and mild cognitive impairment.     PLAN: I had a long discussion with the patient and husband regarding episode  of confusion, memory loss and mild cognitive impairment and answered questions.  I recommend she continue Prevagen 10 mg capsule daily as well as start Cerefolin NAC 1 capsule daily to help with cognitive impairment as well as participate in mentally challenging activities like solving crossword puzzles, playing bridge and sudoku.  She may also consider possible participation and trailblazer early dementia trial if interested given her family history of dementia .  We also discussed memory compensation strategies.  She was advised to continue Keppra XR 500 mg daily for seizure prophylaxis and continue Eliquis for stroke prevention for atrial fibrillation and maintain aggressive risk factor modification. She will return for follow-up for in person visit in 6 months or call earlier if necessary. Greater than 50% of time during this 25 minute visit was spent on counseling,explanation of diagnosis, planning of further management, discussion with patient and family and coordination of care Antony Contras, MD Note: This document was prepared with digital dictation and possible smart phrase technology. Any transcriptional errors that result from this process are unintentional

## 2019-07-08 ENCOUNTER — Other Ambulatory Visit: Payer: Self-pay | Admitting: Cardiology

## 2019-08-04 ENCOUNTER — Other Ambulatory Visit: Payer: Self-pay | Admitting: Neurology

## 2019-08-15 ENCOUNTER — Other Ambulatory Visit: Payer: Self-pay | Admitting: Physician Assistant

## 2019-08-15 MED ORDER — ADVAIR HFA 230-21 MCG/ACT IN AERO: INHALATION_SPRAY | RESPIRATORY_TRACT | 4 refills | Status: DC

## 2019-08-15 NOTE — Telephone Encounter (Signed)
Patient's husband called requested refill of :   fluticasone-salmeterol (ADVAIR HFA) 230-21 MCG/ACT inhaler [[id number]]   Order Details Dose, Route, Frequency: As Directed  Dispense Quantity: 36 g Refills: 4       Sig: USE 2 INHALATIONS TWICE A DAY       --Forwarding request to med asst that if approved send refill order to :   Pharmacy  [facility] [place]  [address], [place] [address]  Phone:  [phone] Fax:  [phone]    --Dion Body

## 2019-08-15 NOTE — Telephone Encounter (Signed)
Refill sent to pharmacy. AS, CMA 

## 2019-08-16 ENCOUNTER — Other Ambulatory Visit: Payer: Self-pay

## 2019-08-16 MED ORDER — DILTIAZEM HCL ER COATED BEADS 180 MG PO CP24: 180.0000 mg | ORAL_CAPSULE | Freq: Every day | ORAL | 2 refills | Status: DC

## 2019-08-22 ENCOUNTER — Other Ambulatory Visit: Payer: Self-pay | Admitting: Family Medicine

## 2019-08-22 DIAGNOSIS — I5032 Chronic diastolic (congestive) heart failure: Secondary | ICD-10-CM

## 2019-08-22 DIAGNOSIS — I351 Nonrheumatic aortic (valve) insufficiency: Secondary | ICD-10-CM

## 2019-08-22 DIAGNOSIS — I4819 Other persistent atrial fibrillation: Secondary | ICD-10-CM

## 2019-08-22 DIAGNOSIS — I10 Essential (primary) hypertension: Secondary | ICD-10-CM

## 2019-09-05 ENCOUNTER — Other Ambulatory Visit: Payer: Self-pay | Admitting: Cardiology

## 2019-09-05 DIAGNOSIS — I5033 Acute on chronic diastolic (congestive) heart failure: Secondary | ICD-10-CM

## 2019-09-11 ENCOUNTER — Telehealth: Payer: Self-pay | Admitting: Physician Assistant

## 2019-09-11 DIAGNOSIS — I5032 Chronic diastolic (congestive) heart failure: Secondary | ICD-10-CM

## 2019-09-11 DIAGNOSIS — I10 Essential (primary) hypertension: Secondary | ICD-10-CM

## 2019-09-11 DIAGNOSIS — I351 Nonrheumatic aortic (valve) insufficiency: Secondary | ICD-10-CM

## 2019-09-11 DIAGNOSIS — I4819 Other persistent atrial fibrillation: Secondary | ICD-10-CM

## 2019-09-11 MED ORDER — GABAPENTIN 400 MG PO CAPS: 400.0000 mg | ORAL_CAPSULE | Freq: Two times a day (BID) | ORAL | 0 refills | Status: DC

## 2019-09-11 NOTE — Addendum Note (Signed)
Addended by: Fonnie Mu on: 09/11/2019 02:56 PM   Modules accepted: Orders

## 2019-09-11 NOTE — Telephone Encounter (Signed)
Patient called in needing a refill on gabapentin

## 2019-09-14 ENCOUNTER — Telehealth: Payer: Self-pay | Admitting: Physician Assistant

## 2019-09-14 NOTE — Telephone Encounter (Signed)
Patient is requesting a refill of her Vit D and thyroid med, if approved please send to [facility].

## 2019-09-14 NOTE — Telephone Encounter (Signed)
Pt informed that it is too early to refill either of these medications.  Pt expressed understanding.  Charyl Bigger, CMA

## 2019-09-23 ENCOUNTER — Other Ambulatory Visit: Payer: Self-pay | Admitting: Family Medicine

## 2019-09-23 DIAGNOSIS — E032 Hypothyroidism due to medicaments and other exogenous substances: Secondary | ICD-10-CM

## 2019-09-26 DIAGNOSIS — H401133 Primary open-angle glaucoma, bilateral, severe stage: Secondary | ICD-10-CM | POA: Diagnosis not present

## 2019-09-26 DIAGNOSIS — H2513 Age-related nuclear cataract, bilateral: Secondary | ICD-10-CM | POA: Diagnosis not present

## 2019-09-26 DIAGNOSIS — H25043 Posterior subcapsular polar age-related cataract, bilateral: Secondary | ICD-10-CM | POA: Diagnosis not present

## 2019-09-26 DIAGNOSIS — H25013 Cortical age-related cataract, bilateral: Secondary | ICD-10-CM | POA: Diagnosis not present

## 2019-09-27 ENCOUNTER — Other Ambulatory Visit: Payer: Self-pay

## 2019-09-27 DIAGNOSIS — R7303 Prediabetes: Secondary | ICD-10-CM

## 2019-09-27 DIAGNOSIS — E032 Hypothyroidism due to medicaments and other exogenous substances: Secondary | ICD-10-CM

## 2019-09-27 DIAGNOSIS — E559 Vitamin D deficiency, unspecified: Secondary | ICD-10-CM

## 2019-09-27 DIAGNOSIS — N183 Chronic kidney disease, stage 3 unspecified: Secondary | ICD-10-CM

## 2019-09-27 DIAGNOSIS — Z79899 Other long term (current) drug therapy: Secondary | ICD-10-CM

## 2019-09-27 DIAGNOSIS — I10 Essential (primary) hypertension: Secondary | ICD-10-CM

## 2019-09-27 DIAGNOSIS — E042 Nontoxic multinodular goiter: Secondary | ICD-10-CM

## 2019-09-27 DIAGNOSIS — E785 Hyperlipidemia, unspecified: Secondary | ICD-10-CM

## 2019-09-28 ENCOUNTER — Encounter: Payer: Self-pay | Admitting: Physician Assistant

## 2019-09-28 ENCOUNTER — Other Ambulatory Visit: Payer: Self-pay

## 2019-09-28 ENCOUNTER — Ambulatory Visit (INDEPENDENT_AMBULATORY_CARE_PROVIDER_SITE_OTHER): Payer: Medicare Other | Admitting: Physician Assistant

## 2019-09-28 ENCOUNTER — Other Ambulatory Visit: Payer: Medicare Other

## 2019-09-28 VITALS — Ht 60.0 in | Wt 130.0 lb

## 2019-09-28 DIAGNOSIS — E559 Vitamin D deficiency, unspecified: Secondary | ICD-10-CM | POA: Diagnosis not present

## 2019-09-28 DIAGNOSIS — R7303 Prediabetes: Secondary | ICD-10-CM | POA: Diagnosis not present

## 2019-09-28 DIAGNOSIS — E032 Hypothyroidism due to medicaments and other exogenous substances: Secondary | ICD-10-CM

## 2019-09-28 DIAGNOSIS — N183 Chronic kidney disease, stage 3 unspecified: Secondary | ICD-10-CM | POA: Diagnosis not present

## 2019-09-28 DIAGNOSIS — E785 Hyperlipidemia, unspecified: Secondary | ICD-10-CM | POA: Diagnosis not present

## 2019-09-28 DIAGNOSIS — Z79899 Other long term (current) drug therapy: Secondary | ICD-10-CM | POA: Diagnosis not present

## 2019-09-28 DIAGNOSIS — I10 Essential (primary) hypertension: Secondary | ICD-10-CM

## 2019-09-28 DIAGNOSIS — E042 Nontoxic multinodular goiter: Secondary | ICD-10-CM | POA: Diagnosis not present

## 2019-09-28 DIAGNOSIS — Z0000 Encounter for general adult medical examination without abnormal findings: Secondary | ICD-10-CM | POA: Diagnosis not present

## 2019-09-28 NOTE — Progress Notes (Signed)
Virtual Visit via Telephone Note:  I connected with Patricia [name] by telephone and verified that I am speaking with the correct person using two identifiers.    I discussed the limitations, risks, security and privacy concerns for performing an evaluation and management service by telephone and the availability of in person appointments. The staff discussed with patient that there may be a patient responsible charge related to this service. The patient expressed understanding and agreed to proceed.   Location of Patient- Home Location of Provider- Office   Subjective:   Patricia [name] is a 79 y.o. female who presents for Medicare Annual (Subsequent) preventive examination.  Review of Systems     General:   No F/C, wt loss Pulm:   No DIB, SOB, pleuritic chest pain Card:  No CP, palpitations Abd:  No n/v/d or pain Ext:  No inc edema from baseline     Objective:    Today's Vitals   09/28/19 1317  Weight: 130 lb (59 kg)  Height: 5' (1.524 m)   Body mass index is 25.39 kg/m.  Advanced Directives 11/30/2018 09/08/2016 05/19/2016 04/27/2016 09/18/2015 08/25/2015 06/20/2015  Does Patient Have a Medical Advance Directive? Yes Yes Yes Yes Yes No Yes  Type of Paramedic of [place];Living will [place];Living will;Out of facility DNR (pink MOST or yellow form) [place];Living will Living will - - -  Does patient want to make changes to medical advance directive? - - - No - Patient declined - - -  Copy of [place] in Chart? No - copy requested No - copy requested - - - - Yes  Would patient like information on creating a medical advance directive? No - Patient declined - - - - No - patient declined information -    Current Medications (verified) Outpatient Encounter Medications as of 09/28/2019  Medication Sig  . albuterol (PROVENTIL HFA;VENTOLIN HFA) 108 (90 Base) MCG/ACT inhaler Inhale 2 puffs into the lungs every 6  (six) hours as needed for wheezing or shortness of breath.  . anastrozole (ARIMIDEX) 1 MG tablet TAKE 1 TABLET DAILY  . Apoaequorin (PREVAGEN) 10 MG CAPS Take 1 capsule by mouth every morning.  Marland Kitchen atorvastatin (LIPITOR) 40 MG tablet TAKE 1 TABLET DAILY  . calcium-vitamin D (OSCAL WITH D) 500-200 MG-UNIT tablet Take 1 tablet by mouth daily with breakfast.  . digoxin (LANOXIN) 0.125 MG tablet Take 1 tablet (0.125 mg total) by mouth daily.  Marland Kitchen diltiazem (CARDIZEM CD) 180 MG 24 hr capsule Take 1 capsule (180 mg total) by mouth daily. Please make yearly appt with Dr. Radford Pax for September for future refills. 1st attempt  . ELIQUIS 5 MG TABS tablet TAKE 1 TABLET TWICE A DAY  . fluticasone (FLONASE) 50 MCG/ACT nasal spray 1 spray each nostril following sinus rinses twice daily  . fluticasone-salmeterol (ADVAIR HFA) 230-21 MCG/ACT inhaler USE 2 INHALATIONS TWICE A DAY  . furosemide (LASIX) 20 MG tablet TAKE 2 TABLETS EVERY MORNING AND 1 TABLET EVERY EVENING  . L-Methylfolate-B12-B6-B2 (CEREFOLIN) 07-31-48-5 MG TABS Take 1 capsule by mouth every morning.  . Multiple Vitamins-Iron (MULTIVITAMIN/IRON) TABS Take 1 tablet by mouth daily.   . Vitamin D, Ergocalciferol, (DRISDOL) 1.25 MG (50000 UNIT) CAPS capsule TAKE 1 CAPSULE EVERY WEDNESDAY AND SUNDAY  . [DISCONTINUED] ARMOUR THYROID 60 MG tablet TAKE 1 TABLET DAILY BEFORE BREAKFAST  . [DISCONTINUED] gabapentin (NEURONTIN) 400 MG capsule Take 1 capsule (400 mg total) by mouth 2 (two) times daily. OFFICE  VISIT REQUIRED PRIOR TO ANY FURTHER REFILLS  . [DISCONTINUED] omeprazole (PRILOSEC) 20 MG capsule TAKE 1 CAPSULE TWICE A DAY 30 MINUTES BEFORE MEALS (BEFORE LUNCH AND DINNER)  . [DISCONTINUED] potassium chloride SA (KLOR-CON) 20 MEQ tablet TAKE 1 TABLET TWICE A DAY  . levETIRAcetam (KEPPRA XR) 500 MG 24 hr tablet TAKE 1 TABLET DAILY (Patient not taking: Reported on 09/28/2019)   Facility-Administered Encounter Medications as of 09/28/2019  Medication  . denosumab  (PROLIA) injection 60 mg    Allergies (verified) Combigan [brimonidine tartrate-timolol], Other, Sulfa antibiotics, and Sulfamethoxazole-trimethoprim   History: Past Medical History:  Diagnosis Date  . Allergic rhinitis, cause unspecified   . Aortic regurgitation 06/17/2015   Mild to moderate AR by echo 04/2015  . 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 Chronic cough   . Chronic diastolic heart failure ([place]) 06/17/2015  . CKD (chronic kidney disease), stage III   . Cough variant asthma   . Disease of pharynx or nasopharynx   . Diverticulosis   . Essential hypertension   . GERD (gastroesophageal reflux disease)   . Glaucoma   . Hiatal hernia   . Hyperlipidemia   . Internal hemorrhoids   . Laryngospasm   . Multinodular goiter   . Osteopenia   . Persistent atrial fibrillation ([place])    a. Dx 04/2015 at time of stroke. b. DCCV 06/2015 - did not hold. Amio started then stopped due to QT prolongation.  Marland Kitchen Postmenopausal   . Radiation 10/22/14-11/23/14   Left Breast  . Stricture and stenosis of cervix    Past Surgical History:  Procedure Laterality Date  . BREAST LUMPECTOMY Left   . BREAST SURGERY    . CARDIOVERSION N/A 06/24/2015   Procedure: CARDIOVERSION;  Surgeon: Sueanne Margarita, MD;  Location: [place];  Service: Cardiovascular;  Laterality: N/A;  . CARDIOVERSION N/A 05/19/2016   Procedure: CARDIOVERSION;  Surgeon: Sueanne Margarita, MD;  [facility];  Service: Cardiovascular;  Laterality: N/A;  . COLONOSCOPY    . LUNG CANCER SURGERY  2003   resection carcinoid lingula-lt upper lobe  . RADIOACTIVE SEED GUIDED PARTIAL MASTECTOMY WITH AXILLARY SENTINEL LYMPH NODE BIOPSY Left 06/26/2014   Procedure: RADIOACTIVE SEED GUIDED PARTIAL MASTECTOMY WITH AXILLARY SENTINEL LYMPH NODE BIOPSY;  Surgeon: Stark Klein, MD;  Location: [place];  Service: General;  Laterality: Left;  . RE-EXCISION OF BREAST LUMPECTOMY  Left 08/07/2014   Procedure: RE-EXCISION OF LEFT BREAST LUMPECTOMY;  Surgeon: Stark Klein, MD;  Location: [place];  Service: General;  Laterality: Left;  . RIGHT HEART CATH N/A 04/27/2016   Procedure: Right Heart Cath;  Surgeon: Larey Dresser, MD;  Location: [place] CV [facility];  Service: Cardiovascular;  Laterality: N/A;   Family History  Problem Relation Age of Onset  . Stroke Mother   . Hypertension Mother   . Hyperlipidemia Mother   . Stroke Father   . Transient ischemic attack Father   . Hyperlipidemia Father   . Hypertension Father   . Atrial fibrillation Son   . Heart attack Neg Hx    Social History   Socioeconomic History  . Marital status: Married    Spouse name: Not on file  . Number of children: Not on file  . Years of education: Not on file  . Highest education level: Not on file  Occupational History  . Not on file  Tobacco Use  .  Smoking status: Former Smoker    Packs/day: 0.75    Years: 5.00    Pack years: 3.75    Quit date: 06/14/1961    Years since quitting: 58.3  . Smokeless tobacco: Never Used  Vaping Use  . Vaping Use: Never used  Substance and Sexual Activity  . Alcohol use: Yes    Alcohol/week: 0.0 standard drinks    Comment: occasional wine  . Drug use: No  . Sexual activity: Not Currently  Other Topics Concern  . Not on file  Social History Narrative   Retired. Lives with husband.    Social Determinants of Health   Financial Resource Strain:   . Difficulty of Paying Living Expenses:   Food Insecurity:   . Worried About Charity fundraiser in the Last Year:   . Arboriculturist in the Last Year:   Transportation Needs:   . Film/video editor (Medical):   Marland Kitchen Lack of Transportation (Non-Medical):   Physical Activity:   . Days of Exercise per Week:   . Minutes of Exercise per Session:   Stress:   . Feeling of Stress :   Social Connections:   . Frequency of Communication with Friends and Family:   . Frequency of  Social Gatherings with Friends and Family:   . Attends Religious Services:   . Active Member of Clubs or Organizations:   . Attends Archivist Meetings:   Marland Kitchen Marital Status:     Tobacco Counseling Counseling given: Not Answered   Diabetic?no    Activities of Daily Living In your present state of health, do you have any difficulty performing the following activities: 09/28/2019 03/06/2019  Hearing? N N  Vision? N N  Difficulty concentrating or making decisions? N N  Walking or climbing stairs? N N  Dressing or bathing? N N  Doing errands, shopping? N N  Some recent data might be hidden    Patient Care Team: Lorrene Reid, PA-C as PCP - General Sueanne Margarita, MD as PCP - Cardiology (Cardiology) Constance Haw, MD as PCP - Electrophysiology (Cardiology) Thea Silversmith, MD as Consulting Physician (Radiation Oncology) Magrinat, Virgie Dad, MD as Consulting Physician (Oncology) Stark Klein, MD as Consulting Physician (General Surgery) Ladene Artist, MD as Consulting Physician (Gastroenterology) Marygrace Drought, MD as Consulting Physician (Ophthalmology) Garvin Fila, MD as Consulting Physician (Neurology)  Indicate any recent Medical Services you may have received from other than [facility] providers in the past year (date may be approximate).     Assessment:   This is a routine wellness examination for [facility].  Hearing/Vision screen No exam data present  Dietary issues and exercise activities discussed: -Continue to follow well balanced diet. -Continue to stay active with golf and walking.  Goals   None    Depression Screen PHQ 2/9 Scores 09/28/2019 03/06/2019 12/05/2018 08/15/2018 04/18/2018 02/08/2018 12/14/2017  PHQ - 2 Score 0 0 1 0 0 0 0  PHQ- 9 Score 0 0 9 0 0 0 0    Fall Risk Fall Risk  09/28/2019 12/22/2018 12/05/2018 04/18/2018 09/27/2017  Falls in the past year? 0 0 0 0 No  Number falls in past yr: - - - - -  Injury with Fall? - - - - -   Follow up Falls evaluation completed - - Falls evaluation completed -    Any stairs in or around the home? Yes  If so, are there any without handrails? Yes  Home free of loose throw  rugs in walkways, pet beds, electrical cords, etc? Yes  Adequate lighting in your home to reduce risk of falls? Yes   ASSISTIVE DEVICES UTILIZED TO PREVENT FALLS:  Life alert? No  Use of a cane, walker or w/c? No  Grab bars in the bathroom? No  Shower chair or bench in shower? Yes  Elevated toilet seat or a handicapped toilet? Yes   TIMED UP AND GO:  Was the test performed? Yes .  Length of time to ambulate 10 feet:  sec. --not performed, TELEHEALTH    Cognitive Function: MMSE - Mini Mental State Exam 09/27/2017 03/08/2017 11/19/2016 05/14/2016  Orientation to time 4 5 3 3   Orientation to Place 5 5 5 5   Registration 3 3 3 3   Attention/ Calculation 5 5 5 3   Recall 3 3 3 2   Language- name 2 objects 2 2 2 2   Language- repeat 1 1 1 1   Language- follow 3 step command 3 3 3 3   Language- read & follow direction 1 1 1 1   Write a sentence 1 1 1 1   Copy design 1 1 1 1   Total score 29 30 28 25      6CIT Screen 09/28/2019 12/05/2018 03/08/2017  What Year? 0 points 0 points 0 points  What month? 0 points 0 points 0 points  What time? 0 points 0 points 0 points  Count back from 20 0 points 0 points 0 points  Months in reverse 0 points 0 points 0 points  Repeat phrase 0 points 0 points 2 points  Total Score 0 0 2    Immunizations Immunization History  Administered Date(s) Administered  . Hepatitis A 10/01/2015  . Hepatitis A, Adult 12/29/2012  . Influenza Split 12/11/2010, 12/01/2011  . Influenza Whole 01/21/2007, 01/01/2009, 11/28/2009  . Influenza, High Dose Seasonal PF 12/01/2013, 12/14/2014, 12/02/2015, 12/18/2016, 12/14/2017  . Influenza,inj,Quad PF,6+ Mos 12/29/2012, 12/01/2013  . Influenza-Unspecified 12/18/2016  . PFIZER SARS-COV-2 Vaccination 03/14/2019, 04/03/2019  . Pneumococcal  Conjugate-13 02/08/2014  . Pneumococcal Polysaccharide-23 03/02/2005, 01/26/2011  . Td 03/02/2000, 12/29/2012  . Typhoid Inactivated 10/01/2015  . Yellow Fever 10/01/2015  . Zoster 05/03/2006    TDAP status: Up to date Flu Vaccine status: Up to date Pneumococcal vaccine status: Up to date Covid-19 vaccine status: Completed vaccines  Qualifies for Shingles Vaccine? Yes   Zostavax completed No   Shingrix Completed?: No.    Education has been provided regarding the importance of this vaccine. Patient has been advised to call insurance company to determine out of pocket expense if they have not yet received this vaccine. Advised may also receive vaccine at local pharmacy or Health Dept. Verbalized acceptance and understanding.  Screening Tests Health Maintenance  Topic Date Due  . INFLUENZA VACCINE  10/01/2019  . Hepatitis C Screening  09/27/2020 (Originally [birth year])  . MAMMOGRAM  06/13/2020  . TETANUS/TDAP  12/30/2022  . DEXA SCAN  Completed  . COVID-19 Vaccine  Completed  . PNA vac Low Risk Adult  Completed    Health Maintenance  Health Maintenance Due  Topic Date Due  . INFLUENZA VACCINE  10/01/2019    Colorectal cancer screening: No longer required.  Mammogram status: No longer required.  Bone Density status: Completed 07/22/2017. Results reflect: Bone density results: NORMAL. Repeat every 3 years.  Lung Cancer Screening: (Low Dose CT Chest recommended if Age 28-80 years, 30 pack-year currently smoking OR have quit w/in 15years.) does not qualify.   Lung Cancer Screening Referral: N/A  Additional Screening:  Hepatitis  C Screening: does Patient declined  Vision Screening: Recommended annual ophthalmology exams for early detection of glaucoma and other disorders of the eye. Is the patient up to date with their annual eye exam?  Yes  Who is the provider or what is the name of the office in which the patient attends annual eye exams?  Dr. Satira Sark If pt is not  established with a provider, would they like to be referred to a provider to establish care? No .   Dental Screening: Recommended annual dental exams for proper oral hygiene  Community Resource Referral / Chronic Care Management: CRR required this visit?  No   CCM required this visit?  No      Plan:  -Continue current medication regimen. -Continue to follow-up with various specialist. -Will notify you of fasting blood work results obtained today. -Follow-up in 4 to 6 months for regular office visit: HTN, HLD, hypothyroid  I have personally reviewed and noted the following in the patient's chart:   . Medical and social history . Use of alcohol, tobacco or illicit drugs  . Current medications and supplements . Functional ability and status . Nutritional status . Physical activity . Advanced directives . List of other physicians . Hospitalizations, surgeries, and ER visits in previous 12 months . Vitals . Screenings to include cognitive, depression, and falls . Referrals and appointments  In addition, I have reviewed and discussed with patient certain preventive protocols, quality metrics, and best practice recommendations. A written personalized care plan for preventive services as well as general preventive health recommendations were provided to patient.       10/15/2019   Nurse Notes: none

## 2019-09-29 LAB — COMPREHENSIVE METABOLIC PANEL
ALT: 36 IU/L — ABNORMAL HIGH (ref 0–32)
AST: 24 IU/L (ref 0–40)
Albumin/Globulin Ratio: 1.9 (ref 1.2–2.2)
Albumin: 4.1 g/dL (ref 3.7–4.7)
Alkaline Phosphatase: 83 IU/L (ref 48–121)
BUN/Creatinine Ratio: 18 (ref 12–28)
BUN: 18 mg/dL (ref 8–27)
Bilirubin Total: 0.6 mg/dL (ref 0.0–1.2)
CO2: 26 mmol/L (ref 20–29)
Calcium: 9.1 mg/dL (ref 8.7–10.3)
Chloride: 104 mmol/L (ref 96–106)
Creatinine, Ser: 1.02 mg/dL — ABNORMAL HIGH (ref 0.57–1.00)
GFR calc Af Amer: 61 mL/min/{1.73_m2} (ref >59)
GFR calc non Af Amer: 53 mL/min/{1.73_m2} — ABNORMAL LOW (ref >59)
Globulin, Total: 2.2 g/dL (ref 1.5–4.5)
Glucose: 109 mg/dL — ABNORMAL HIGH (ref 65–99)
Potassium: 4.2 mmol/L (ref 3.5–5.2)
Sodium: 141 mmol/L (ref 134–144)
Total Protein: 6.3 g/dL (ref 6.0–8.5)

## 2019-09-29 LAB — CBC WITH DIFFERENTIAL/PLATELET
Basophils Absolute: 0 10*3/uL (ref 0.0–0.2)
Basos: 1 %
EOS (ABSOLUTE): 0.2 10*3/uL (ref 0.0–0.4)
Eos: 3 %
Hematocrit: 44 % (ref 34.0–46.6)
Hemoglobin: 14.5 g/dL (ref 11.1–15.9)
Immature Grans (Abs): 0.1 10*3/uL (ref 0.0–0.1)
Immature Granulocytes: 1 %
Lymphocytes Absolute: 1.4 10*3/uL (ref 0.7–3.1)
Lymphs: 21 %
MCH: 31.5 pg (ref 26.6–33.0)
MCHC: 33 g/dL (ref 31.5–35.7)
MCV: 95 fL (ref 79–97)
Monocytes Absolute: 0.6 10*3/uL (ref 0.1–0.9)
Monocytes: 9 %
Neutrophils Absolute: 4.4 10*3/uL (ref 1.4–7.0)
Neutrophils: 65 %
Platelets: 234 10*3/uL (ref 150–450)
RBC: 4.61 x10E6/uL (ref 3.77–5.28)
RDW: 13.8 % (ref 11.7–15.4)
WBC: 6.7 10*3/uL (ref 3.4–10.8)

## 2019-09-29 LAB — HEMOGLOBIN A1C
Est. average glucose Bld gHb Est-mCnc: 134 mg/dL
Hgb A1c MFr Bld: 6.3 % — ABNORMAL HIGH (ref 4.8–5.6)

## 2019-09-29 LAB — LIPID PANEL
Chol/HDL Ratio: 3 ratio (ref 0.0–4.4)
Cholesterol, Total: 139 mg/dL (ref 100–199)
HDL: 47 mg/dL (ref >39)
LDL Chol Calc (NIH): 75 mg/dL (ref 0–99)
Triglycerides: 86 mg/dL (ref 0–149)
VLDL Cholesterol Cal: 17 mg/dL (ref 5–40)

## 2019-09-29 LAB — VITAMIN D 25 HYDROXY (VIT D DEFICIENCY, FRACTURES): Vit D, 25-Hydroxy: 122 ng/mL — ABNORMAL HIGH (ref 30.0–100.0)

## 2019-09-29 LAB — TSH: TSH: 2.94 u[IU]/mL (ref 0.450–4.500)

## 2019-10-02 ENCOUNTER — Telehealth: Payer: Self-pay | Admitting: Physician Assistant

## 2019-10-02 NOTE — Telephone Encounter (Signed)
Error

## 2019-10-03 ENCOUNTER — Ambulatory Visit: Payer: Medicare Other | Admitting: Physician Assistant

## 2019-10-05 ENCOUNTER — Telehealth: Payer: Self-pay | Admitting: Physician Assistant

## 2019-10-05 ENCOUNTER — Other Ambulatory Visit: Payer: Self-pay | Admitting: Cardiology

## 2019-10-05 DIAGNOSIS — E032 Hypothyroidism due to medicaments and other exogenous substances: Secondary | ICD-10-CM

## 2019-10-05 DIAGNOSIS — I351 Nonrheumatic aortic (valve) insufficiency: Secondary | ICD-10-CM

## 2019-10-05 DIAGNOSIS — I5032 Chronic diastolic (congestive) heart failure: Secondary | ICD-10-CM

## 2019-10-05 DIAGNOSIS — I4819 Other persistent atrial fibrillation: Secondary | ICD-10-CM

## 2019-10-05 DIAGNOSIS — I10 Essential (primary) hypertension: Secondary | ICD-10-CM

## 2019-10-05 MED ORDER — THYROID 60 MG PO TABS: 60.0000 mg | ORAL_TABLET | Freq: Every day | ORAL | 0 refills | Status: DC

## 2019-10-05 MED ORDER — GABAPENTIN 400 MG PO CAPS: 400.0000 mg | ORAL_CAPSULE | Freq: Two times a day (BID) | ORAL | 0 refills | Status: DC

## 2019-10-05 MED ORDER — OMEPRAZOLE 20 MG PO CPDR: 20.0000 mg | DELAYED_RELEASE_CAPSULE | Freq: Two times a day (BID) | ORAL | 0 refills | Status: DC

## 2019-10-05 NOTE — Telephone Encounter (Signed)
Patient is requesting a refill of her thyroid med, gabapentin and omeprazole. If approved please send to [facility].

## 2019-10-05 NOTE — Addendum Note (Signed)
Addended by: Mickel Crow on: 10/05/2019 11:51 AM   Modules accepted: Orders

## 2019-10-05 NOTE — Telephone Encounter (Signed)
Refill sent to requested pharmacy. AS, CMA 

## 2019-10-09 ENCOUNTER — Other Ambulatory Visit: Payer: Self-pay

## 2019-10-09 MED ORDER — POTASSIUM CHLORIDE CRYS ER 20 MEQ PO TBCR: 20.0000 meq | EXTENDED_RELEASE_TABLET | Freq: Two times a day (BID) | ORAL | 0 refills | Status: DC

## 2019-10-10 ENCOUNTER — Telehealth: Payer: Medicare Other | Admitting: Cardiology

## 2019-10-27 ENCOUNTER — Other Ambulatory Visit: Payer: Self-pay | Admitting: Physician Assistant

## 2019-11-02 DIAGNOSIS — H25012 Cortical age-related cataract, left eye: Secondary | ICD-10-CM | POA: Diagnosis not present

## 2019-11-02 DIAGNOSIS — H25812 Combined forms of age-related cataract, left eye: Secondary | ICD-10-CM | POA: Diagnosis not present

## 2019-11-02 DIAGNOSIS — H25042 Posterior subcapsular polar age-related cataract, left eye: Secondary | ICD-10-CM | POA: Diagnosis not present

## 2019-11-02 DIAGNOSIS — H2512 Age-related nuclear cataract, left eye: Secondary | ICD-10-CM | POA: Diagnosis not present

## 2019-11-08 ENCOUNTER — Other Ambulatory Visit: Payer: Self-pay | Admitting: Cardiology

## 2019-11-21 ENCOUNTER — Encounter: Payer: Self-pay | Admitting: Cardiology

## 2019-11-21 ENCOUNTER — Other Ambulatory Visit: Payer: Self-pay

## 2019-11-21 ENCOUNTER — Ambulatory Visit (INDEPENDENT_AMBULATORY_CARE_PROVIDER_SITE_OTHER): Payer: Medicare Other | Admitting: Cardiology

## 2019-11-21 VITALS — BP 122/82 | HR 73 | Ht 60.0 in | Wt 132.0 lb

## 2019-11-21 DIAGNOSIS — E7800 Pure hypercholesterolemia, unspecified: Secondary | ICD-10-CM | POA: Diagnosis not present

## 2019-11-21 DIAGNOSIS — I10 Essential (primary) hypertension: Secondary | ICD-10-CM

## 2019-11-21 DIAGNOSIS — I351 Nonrheumatic aortic (valve) insufficiency: Secondary | ICD-10-CM | POA: Diagnosis not present

## 2019-11-21 DIAGNOSIS — I272 Pulmonary hypertension, unspecified: Secondary | ICD-10-CM | POA: Diagnosis not present

## 2019-11-21 DIAGNOSIS — I5032 Chronic diastolic (congestive) heart failure: Secondary | ICD-10-CM | POA: Diagnosis not present

## 2019-11-21 DIAGNOSIS — I482 Chronic atrial fibrillation, unspecified: Secondary | ICD-10-CM | POA: Diagnosis not present

## 2019-11-21 DIAGNOSIS — I6389 Other cerebral infarction: Secondary | ICD-10-CM

## 2019-11-21 DIAGNOSIS — N1831 Chronic kidney disease, stage 3a: Secondary | ICD-10-CM | POA: Diagnosis not present

## 2019-11-21 NOTE — Patient Instructions (Signed)
Medication Instructions:  Your physician recommends that you continue on your current medications as directed. Please refer to the Current Medication list given to you today.  *If you need a refill on your cardiac medications before your next appointment, please call your pharmacy*   Lab Work: TODAY: digoxin If you have labs (blood work) drawn today and your tests are completely normal, you will receive your results only by: Marland Kitchen MyChart Message (if you have MyChart) OR . A paper copy in the mail If you have any lab test that is abnormal or we need to change your treatment, we will call you to review the results.   Testing/Procedures: Your physician has requested that you have an echocardiogram. Echocardiography is a painless test that uses sound waves to create images of your heart. It provides your doctor with information about the size and shape of your heart and how well your heart's chambers and valves are working. This procedure takes approximately one hour. There are no restrictions for this procedure.  Follow-Up: At [facility], you and your health needs are our priority.  As part of our continuing mission to provide you with exceptional heart care, we have created designated Provider Care Teams.  These Care Teams include your primary Cardiologist (physician) and Advanced Practice Providers (APPs -  Physician Assistants and Nurse Practitioners) who all work together to provide you with the care you need, when you need it.  Your next appointment:   6 month(s)  The format for your next appointment:   In Person  Provider:   You may see Fransico Him, MD or one of the following Advanced Practice Providers on your designated Care Team:    Melina Copa, PA-C  Ermalinda Barrios, PA-C

## 2019-11-21 NOTE — Addendum Note (Signed)
Addended by: Antonieta Iba on: 11/21/2019 01:46 PM   Modules accepted: Orders

## 2019-11-21 NOTE — Progress Notes (Signed)
Cardiology Office Note    Date:  11/30/2018   ID:  Patricia [name], DOB June 06, 1940, MRN [id number]  PCP:  Mellody Dance, DO  Cardiologist: Fransico Him, MD EPS: None  Chief Complaint  Patient presents with  . Hospitalization Follow-up    History of Present Illness:  Patricia [name] is a 79 y.o. female with history of chronic AFib on Eliquis, CVA, amiodarone stopped because of prolonged QT, chronic diastolic CHF, mild  AI, normal NST 2017, lung CA resection 2003, breast CA s/p lumpectomy/Tamoxifen/XRT, HTN, HLD. 2D echo in 2018 showed moderate PHTN with PASP 57mmHg and RHC 04/2016 with borderline pulmonary HTN.  11/29/18 patient was found slumped against a wall with slurred speech and weakness. Symptoms resolved spontaneously. Went to ER but left triage because of wait time. Labs stable and EKG unchanged.  Patient went into house to get checkbook. Husband found her laying on the floor slumped against the wall. She was awake but incoherent and wouldn't move. She was back to normal within 5 min.EMS blood sugar normal, vitals normal and stroke eval normal. She went to ER and evaluation with MRI was normal.  She is followed with Dr. Leonie Man.    She is here today for followup and is doing well.  She denies any chest pain or pressure, SOB, DOE, PND, orthopnea, LE edema, dizziness, palpitations or syncope. She is compliant with her meds and is tolerating meds with no SE.     Past Medical History:  Diagnosis Date  . Allergic rhinitis, cause unspecified   . Aortic regurgitation 06/17/2015   Mild to moderate AR by echo 04/2015  . 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 Chronic cough   . Chronic diastolic heart failure ([place]) 06/17/2015  . CKD (chronic kidney disease), stage III ([place])   . Cough variant asthma   . Disease of pharynx or nasopharynx   . Diverticulosis   . Essential hypertension   . GERD (gastroesophageal reflux disease)   . Glaucoma   .  Hiatal hernia   . Hyperlipidemia   . Internal hemorrhoids   . Laryngospasm   . Multinodular goiter   . Osteopenia   . Persistent atrial fibrillation    a. Dx 04/2015 at time of stroke. b. DCCV 06/2015 - did not hold. Amio started then stopped due to QT prolongation.  Marland Kitchen Postmenopausal   . Radiation 10/22/14-11/23/14   Left Breast  . Stricture and stenosis of cervix     Past Surgical History:  Procedure Laterality Date  . BREAST SURGERY    . CARDIOVERSION N/A 06/24/2015   Procedure: CARDIOVERSION;  Surgeon: Sueanne Margarita, MD;  Location: [place];  Service: Cardiovascular;  Laterality: N/A;  . CARDIOVERSION N/A 05/19/2016   Procedure: CARDIOVERSION;  Surgeon: Sueanne Margarita, MD;  [facility];  Service: Cardiovascular;  Laterality: N/A;  . COLONOSCOPY    . LUNG CANCER SURGERY  2003   resection carcinoid lingula-lt upper lobe  . RADIOACTIVE SEED GUIDED PARTIAL MASTECTOMY WITH AXILLARY SENTINEL LYMPH NODE BIOPSY Left 06/26/2014   Procedure: RADIOACTIVE SEED GUIDED PARTIAL MASTECTOMY WITH AXILLARY SENTINEL LYMPH NODE BIOPSY;  Surgeon: Stark Klein, MD;  Location: [facility];  Service: General;  Laterality: Left;  . RE-EXCISION OF BREAST LUMPECTOMY Left 08/07/2014   Procedure: RE-EXCISION OF LEFT BREAST LUMPECTOMY;  Surgeon: Stark Klein, MD;  Location: [place];  Service: General;  Laterality: Left;  .  RIGHT HEART CATH N/A 04/27/2016   Procedure: Right Heart Cath;  Surgeon: Larey Dresser, MD;  Location: [place] [facility];  Service: Cardiovascular;  Laterality: N/A;    Current Medications: Current Meds  Medication Sig  . albuterol (PROVENTIL HFA;VENTOLIN HFA) 108 (90 Base) MCG/ACT inhaler Inhale 2 puffs into the lungs every 6 (six) hours as needed for wheezing or shortness of breath.  . anastrozole (ARIMIDEX) 1 MG tablet Take 1 tablet (1 mg total) by mouth daily.  Francia Greaves THYROID 60 MG tablet TAKE 1 TABLET DAILY BEFORE BREAKFAST  . atorvastatin  (LIPITOR) 40 MG tablet TAKE 1 TABLET DAILY  . calcium-vitamin D (OSCAL WITH D) 500-200 MG-UNIT tablet Take 1 tablet by mouth daily with breakfast.  . digoxin (LANOXIN) 0.125 MG tablet TAKE 1 TABLET DAILY (NEED TO CALL AND MAKE APPOINTMENT BY APRIL FOR FURTHER REFILLS)  . diltiazem (CARDIZEM CD) 180 MG 24 hr capsule TAKE 1 CAPSULE DAILY (PLEASE MAKE OVERDUE APPOINTMENT WITH DR. Radford Pax BEFORE ANYMORE REFILLS [phone])  . ELIQUIS 5 MG TABS tablet TAKE 1 TABLET TWICE A DAY  . fluticasone (FLONASE) 50 MCG/ACT nasal spray 1 spray each nostril following sinus rinses twice daily  . fluticasone-salmeterol (ADVAIR HFA) 230-21 MCG/ACT inhaler USE 2 INHALATIONS TWICE A DAY  . furosemide (LASIX) 20 MG tablet TAKE 2 TABLETS EVERY MORNING AND 1 TABLET EVERY EVENING (NEED APPOINTMENT, 2ND ATTEMPT)  . gabapentin (NEURONTIN) 400 MG capsule TAKE 1 CAPSULE TWICE A DAY  . Multiple Vitamins-Iron (MULTIVITAMIN/IRON) TABS Take 1 tablet by mouth daily.   Marland Kitchen omeprazole (PRILOSEC) 20 MG capsule TAKE 1 CAPSULE TWICE A DAY 30 MINUTES BEFORE MEALS (BEFORE LUNCH AND DINNER)  . potassium chloride SA (K-DUR,KLOR-CON) 20 MEQ tablet Take 1 tablet by mouth twice a day  . Vitamin D, Ergocalciferol, (DRISDOL) 1.25 MG (50000 UT) CAPS capsule TAKE 1 CAPSULE EVERY WEDNESDAY AND SUNDAY     Allergies:   Combigan [brimonidine tartrate-timolol], Other, Sulfa antibiotics, and Sulfamethoxazole-trimethoprim   Social History   Socioeconomic History  . Marital status: Married    Spouse name: Not on file  . Number of children: Not on file  . Years of education: Not on file  . Highest education level: Not on file  Occupational History  . Not on file  Social Needs  . Financial resource strain: Not on file  . Food insecurity    Worry: Not on file    Inability: Not on file  . Transportation needs    Medical: Not on file    Non-medical: Not on file  Tobacco Use  . Smoking status: Former Smoker    Packs/day: 0.75    Years: 5.00     Pack years: 3.75    Quit date: 06/14/1961    Years since quitting: 57.5  . Smokeless tobacco: Never Used  Substance and Sexual Activity  . Alcohol use: Yes    Alcohol/week: 0.0 standard drinks    Comment: occasional wine  . Drug use: No  . Sexual activity: Not Currently  Lifestyle  . Physical activity    Days per week: Not on file    Minutes per session: Not on file  . Stress: Not on file  Relationships  . Social Herbalist on phone: Not on file    Gets together: Not on file    Attends religious service: Not on file    Active member of club or organization: Not on file    Attends meetings of clubs or organizations: Not  on file    Relationship status: Not on file  Other Topics Concern  . Not on file  Social History Narrative   Retired. Lives with husband.      Family History:  The patient's   family history includes Atrial fibrillation in her son; Hyperlipidemia in her father and mother; Hypertension in her father and mother; Stroke in her father and mother; Transient ischemic attack in her father.   ROS:   Please see the history of present illness.    Review of Systems  Constitution: Negative.  HENT: Negative.   Eyes: Negative.   Cardiovascular: Negative.   Respiratory: Negative.   Hematologic/Lymphatic: Negative.   Musculoskeletal: Negative.  Negative for joint pain.  Gastrointestinal: Negative.   Genitourinary: Negative.   Neurological: Positive for difficulty with concentration, loss of balance and weakness.  Psychiatric/Behavioral: Positive for memory loss.   All other systems reviewed and are negative.   PHYSICAL EXAM:   VS:  BP 116/60   Pulse 70   Ht 5' (1.524 m)   Wt 129 lb 1.9 oz (58.6 kg)   SpO2 97%   BMI 25.22 kg/m   Physical Exam  GEN: Well nourished, well developed in no acute distress HEENT: Normal NECK: No JVD; No carotid bruits LYMPHATICS: No lymphadenopathy CARDIAC:irregularly irregular, no murmurs, rubs, gallops RESPIRATORY:   Clear to auscultation without rales, wheezing or rhonchi  ABDOMEN: Soft, non-tender, non-distended MUSCULOSKELETAL:  No edema; No deformity  SKIN: Warm and dry NEUROLOGIC:  Alert and oriented x 3 PSYCHIATRIC:  Normal affect    Wt Readings from Last 3 Encounters:  11/30/18 129 lb 1.9 oz (58.6 kg)  10/31/18 132 lb 1.6 oz (59.9 kg)  08/15/18 131 lb (59.4 kg)      Studies/Labs Reviewed:   EKG:  EKG is ordered today and showed atrial fibrillation with CVR at 73bpm with nonspecific ST abnormality  Recent Labs: 10/31/2018: ALT 36 11/28/2018: BUN 15; Creatinine, Ser 1.02; Hemoglobin 16.0; Platelets 359; Potassium 3.7; Sodium 136   Lipid Panel    Component Value Date/Time   CHOL 145 11/05/2017 0842   TRIG 123 11/05/2017 0842   TRIG 52 01/13/2006 1012   HDL 54 11/05/2017 0842   CHOLHDL 2.7 11/05/2017 0842   CHOLHDL 3 04/01/2016 0952   VLDL 19.2 04/01/2016 0952   LDLCALC 66 11/05/2017 0842   LDLDIRECT 174.3 06/04/2009 0000    Additional studies/ records that were reviewed today include:   Echo 2/8/18Study Conclusions   - Left ventricle: The cavity size was normal. Wall thickness was   normal. Systolic function was normal. The estimated ejection   fraction was in the range of 55% to 60%. Wall motion was normal;   there were no regional wall motion abnormalities. Features are   consistent with a pseudonormal left ventricular filling pattern,   with concomitant abnormal relaxation and increased filling   pressure (grade 2 diastolic dysfunction). Doppler parameters are   consistent with high ventricular filling pressure. - Aortic valve: There was mild regurgitation. - Mitral valve: Calcified annulus. There was mild regurgitation. - Left atrium: The atrium was severely dilated. - Tricuspid valve: There was moderate regurgitation. - Pulmonary arteries: Systolic pressure was moderately increased.   PA peak pressure: 52 mm Hg (S).   Impressions:   - Normal LV systolic function;  grade 2 diastolic dysfunction;   elevated LV filling pressure; mild AI; mild MR; severe LAE;   moderate TR with moderately elevated pulmonary pressure.  [place] 2/20181. Mildly elevated right and left  heart filling pressures.  2. Borderline pulmonary hypertension.    Overall, hemodynamics look ok. I do not think she has significant pulmonary hypertension.  However, she is in atrial fibrillation with RVR today.  She is not on rate control meds at home.  I will start diltiazem CD 180 mg daily, giving dose now.  She will restart Eliquis this evening.  I will have her followup in atrial fibrillation clinic.  If she does not revert to NSR, would consider DCCV.  She is, of note, not particularly symptomatic (did not know that she was back in atrial fibrillation    ASSESSMENT:    1. Chronic atrial fibrillation   2. Essential hypertension   3. Chronic diastolic heart failure ([place])   4. Aortic valve insufficiency, etiology of cardiac valve disease unspecified   5. Hyperlipidemia, unspecified hyperlipidemia type   6. History of CVA (cerebrovascular accident)   7. CKD (chronic kidney disease), stage III ([place])      PLAN:  In order of problems listed above:  1.  Chronic  Afib  -denies any palpitations -EKG shows atrial fibrillation today with CVR -denies any bleeding issues on DOAC -continue Eliquis 5mg  BID, Cardizem CD 180mg  daily and dig 0.125mg  daily.  -check dig level  2.  Essential HTN  -BP controlled -continue Cardizem CD 180mg  daily  3.  Chronic diastolic CHF  -she appears euvolemic on exam today -weight is stable -continue lasix 40mg  daily -SCr was 1.02 in July 2021  4.  Aortic regurgitation  -mild by echo 2018  5.  HLD  -LDL goal < 100 -LDL was 75 in July 2021  6.  CKD stage 3  -SCr1.02 in July 2021  7.  Moderate Pulmonary HTN -PASP 22mmHg by echo 2018 -likely Group 2 from pulmonary venous HTN from chronic diastolic CHF -repeat echo to reassess PAP -moderate TR by  echo -continue diuretics -if PAP has increased would recommend RHC    Medication Adjustments/Labs and Tests Ordered: Current medicines are reviewed at length with the patient today.  Concerns regarding medicines are outlined above.  Medication changes, Labs and Tests ordered today are listed in the Patient Instructions below. Patient Instructions  YOU ARE BEING SENT TO THE EMERGENCY ROOM    Signed, Ermalinda Barrios, PA-C  11/30/2018 10:16 AM    [facility] [place], [place], [place]  [address] Phone: [phone]; Fax: [phone]

## 2019-11-22 ENCOUNTER — Telehealth: Payer: Self-pay | Admitting: *Deleted

## 2019-11-22 ENCOUNTER — Telehealth: Payer: Self-pay

## 2019-11-22 DIAGNOSIS — I482 Chronic atrial fibrillation, unspecified: Secondary | ICD-10-CM

## 2019-11-22 DIAGNOSIS — Z79899 Other long term (current) drug therapy: Secondary | ICD-10-CM

## 2019-11-22 LAB — DIGOXIN LEVEL: Digoxin, Serum: 1.2 ng/mL — ABNORMAL HIGH (ref 0.5–0.9)

## 2019-11-22 NOTE — Telephone Encounter (Signed)
-----   Message from Sueanne Margarita, MD sent at 11/22/2019  9:53 AM EDT ----- Dig level too high - stop digoxin and place a 3 day ziopatch to assess HR control off digoxin.

## 2019-11-22 NOTE — Telephone Encounter (Signed)
Left message for patient with results. Advised her to discontinue digoxin and that we would be sending her a heart monitor. Advised to call back with any questions.

## 2019-11-22 NOTE — Telephone Encounter (Signed)
Patient enrolled for [employer] to ship a 3 day ZIO XT long term holter monitor to her home.   Brief instructions given as they are included in the monitor kit.

## 2019-11-27 DIAGNOSIS — Z23 Encounter for immunization: Secondary | ICD-10-CM | POA: Diagnosis not present

## 2019-12-07 ENCOUNTER — Ambulatory Visit (INDEPENDENT_AMBULATORY_CARE_PROVIDER_SITE_OTHER): Payer: Medicare Other

## 2019-12-07 DIAGNOSIS — I482 Chronic atrial fibrillation, unspecified: Secondary | ICD-10-CM

## 2019-12-07 DIAGNOSIS — Z79899 Other long term (current) drug therapy: Secondary | ICD-10-CM

## 2019-12-08 DIAGNOSIS — H43822 Vitreomacular adhesion, left eye: Secondary | ICD-10-CM | POA: Diagnosis not present

## 2019-12-11 ENCOUNTER — Ambulatory Visit (HOSPITAL_COMMUNITY): Payer: Medicare Other | Attending: Cardiology

## 2019-12-11 ENCOUNTER — Other Ambulatory Visit: Payer: Self-pay

## 2019-12-11 DIAGNOSIS — I272 Pulmonary hypertension, unspecified: Secondary | ICD-10-CM | POA: Diagnosis not present

## 2019-12-11 LAB — ECHOCARDIOGRAM COMPLETE
MV M vel: 4.35 m/s
MV Peak grad: 75.6 mmHg
P 1/2 time: 614 msec
Radius: 0.6 cm
S' Lateral: 2.3 cm

## 2019-12-11 MED ORDER — PERFLUTREN LIPID MICROSPHERE
1.0000 mL | INTRAVENOUS | Status: AC | PRN
  Administered 2019-12-11: 1 mL via INTRAVENOUS

## 2019-12-15 ENCOUNTER — Telehealth: Payer: Self-pay | Admitting: *Deleted

## 2019-12-15 DIAGNOSIS — I482 Chronic atrial fibrillation, unspecified: Secondary | ICD-10-CM

## 2019-12-15 DIAGNOSIS — I340 Nonrheumatic mitral (valve) insufficiency: Secondary | ICD-10-CM

## 2019-12-15 DIAGNOSIS — I517 Cardiomegaly: Secondary | ICD-10-CM

## 2019-12-15 DIAGNOSIS — M8589 Other specified disorders of bone density and structure, multiple sites: Secondary | ICD-10-CM | POA: Diagnosis not present

## 2019-12-15 DIAGNOSIS — I071 Rheumatic tricuspid insufficiency: Secondary | ICD-10-CM

## 2019-12-15 DIAGNOSIS — I519 Heart disease, unspecified: Secondary | ICD-10-CM

## 2019-12-15 DIAGNOSIS — M810 Age-related osteoporosis without current pathological fracture: Secondary | ICD-10-CM | POA: Diagnosis not present

## 2019-12-15 DIAGNOSIS — Z853 Personal history of malignant neoplasm of breast: Secondary | ICD-10-CM | POA: Diagnosis not present

## 2019-12-15 DIAGNOSIS — I351 Nonrheumatic aortic (valve) insufficiency: Secondary | ICD-10-CM

## 2019-12-15 NOTE — Telephone Encounter (Signed)
Left message to call office

## 2019-12-15 NOTE — Telephone Encounter (Signed)
-----   Message from Sueanne Margarita, MD sent at 12/13/2019  3:35 PM EDT ----- Echo showed normal heart function with mildly thickened heart muscle, mildly reduced RV function and severe enlargement of both upper chambers.  Mildly to moderately leaky MV, moderately leaky TV and mildly leaky AV.  Repeat echo in 1 year for TR, MR and Ar

## 2019-12-15 NOTE — Telephone Encounter (Signed)
Patricia Margarita, MD  12/13/2019 3:39 PM EDT     Please get VQ scan to rule out pulmonary emboli, PFTs with DLCO and home sleep study all for right cardiac enlargement

## 2019-12-15 NOTE — Telephone Encounter (Signed)
Patient's husband called in returning call. Connected with Mardene Celeste.

## 2019-12-15 NOTE — Telephone Encounter (Signed)
I spoke with patient's husband and reviewed results with him.  He would like to proceed with recommended testing.

## 2019-12-18 ENCOUNTER — Telehealth: Payer: Self-pay

## 2019-12-18 ENCOUNTER — Telehealth: Payer: Self-pay | Admitting: *Deleted

## 2019-12-18 DIAGNOSIS — I4819 Other persistent atrial fibrillation: Secondary | ICD-10-CM

## 2019-12-18 DIAGNOSIS — I482 Chronic atrial fibrillation, unspecified: Secondary | ICD-10-CM | POA: Diagnosis not present

## 2019-12-18 MED ORDER — DILTIAZEM HCL ER COATED BEADS 240 MG PO CP24: 240.0000 mg | ORAL_CAPSULE | Freq: Every day | ORAL | 3 refills | Status: DC

## 2019-12-18 NOTE — Telephone Encounter (Signed)
-----   Message from Nuala Alpha, LPN sent at 06/77/0340 11:29 AM EDT -----  ----- Message ----- From: Sueanne Margarita, MD Sent: 12/18/2019  11:26 AM EDT To: Lorrene Reid, PA-C, [facility] Triage  Heart monitor showed inadequately controlled afib HR since stopping dig.  Please increase cardizem CD to 240mg  daily and get a 24 hour ziopatch

## 2019-12-18 NOTE — Telephone Encounter (Signed)
-----   Message from Thompson Grayer, RN sent at 12/15/2019  4:19 PM EDT ----- Regarding: home sleep study Dr Radford Pax has ordered a home sleep study for this patient. Thanks, Fraser Din

## 2019-12-18 NOTE — Telephone Encounter (Signed)
The patient has been notified of the result and verbalized understanding.  All questions (if any) were answered. Antonieta Iba, RN 12/18/2019 5:10 PM  Patient will increase diltiazem to 240 mg daily. Order placed for Zio patch - will need to be 3 days/

## 2019-12-19 ENCOUNTER — Other Ambulatory Visit (HOSPITAL_COMMUNITY)
Admission: RE | Admit: 2019-12-19 | Discharge: 2019-12-19 | Disposition: A | Discharge status: Home / Self Care | Payer: Medicare Other | Source: Ambulatory Visit | Attending: Cardiology | Admitting: Cardiology

## 2019-12-19 DIAGNOSIS — Z20822 Contact with and (suspected) exposure to covid-19: Secondary | ICD-10-CM | POA: Diagnosis not present

## 2019-12-19 DIAGNOSIS — Z01812 Encounter for preprocedural laboratory examination: Secondary | ICD-10-CM | POA: Insufficient documentation

## 2019-12-19 LAB — SARS CORONAVIRUS 2 (TAT 6-24 HRS): SARS Coronavirus 2: NEGATIVE

## 2019-12-20 NOTE — Telephone Encounter (Signed)
No PA Required

## 2019-12-21 ENCOUNTER — Other Ambulatory Visit: Payer: Self-pay

## 2019-12-21 ENCOUNTER — Ambulatory Visit (HOSPITAL_COMMUNITY)
Admission: RE | Admit: 2019-12-21 | Discharge: 2019-12-21 | Disposition: A | Discharge status: Home / Self Care | Payer: Medicare Other | Source: Ambulatory Visit | Attending: Cardiology | Admitting: Cardiology

## 2019-12-21 ENCOUNTER — Encounter (HOSPITAL_COMMUNITY)
Admission: RE | Admit: 2019-12-21 | Discharge: 2019-12-21 | Disposition: A | Discharge status: Home / Self Care | Payer: Medicare Other | Source: Ambulatory Visit | Attending: Cardiology | Admitting: Cardiology

## 2019-12-21 DIAGNOSIS — I519 Heart disease, unspecified: Secondary | ICD-10-CM | POA: Insufficient documentation

## 2019-12-21 DIAGNOSIS — I10 Essential (primary) hypertension: Secondary | ICD-10-CM | POA: Diagnosis not present

## 2019-12-21 DIAGNOSIS — Z85118 Personal history of other malignant neoplasm of bronchus and lung: Secondary | ICD-10-CM | POA: Diagnosis not present

## 2019-12-21 DIAGNOSIS — I509 Heart failure, unspecified: Secondary | ICD-10-CM | POA: Diagnosis not present

## 2019-12-21 DIAGNOSIS — Z853 Personal history of malignant neoplasm of breast: Secondary | ICD-10-CM | POA: Diagnosis not present

## 2019-12-21 DIAGNOSIS — J849 Interstitial pulmonary disease, unspecified: Secondary | ICD-10-CM | POA: Insufficient documentation

## 2019-12-21 DIAGNOSIS — I517 Cardiomegaly: Secondary | ICD-10-CM

## 2019-12-21 DIAGNOSIS — I4891 Unspecified atrial fibrillation: Secondary | ICD-10-CM | POA: Diagnosis not present

## 2019-12-21 LAB — PULMONARY FUNCTION TEST
DL/VA % pred: 95 %
DL/VA: 4.07 ml/min/mmHg/L
DLCO unc % pred: 60 %
DLCO unc: 9.51 ml/min/mmHg
FEF 25-75 Post: 2.01 L/sec
FEF 25-75 Pre: 1.54 L/sec
FEF2575-%Change-Post: 30 %
FEF2575-%Pred-Post: 170 %
FEF2575-%Pred-Pre: 130 %
FEV1-%Change-Post: 4 %
FEV1-%Pred-Post: 91 %
FEV1-%Pred-Pre: 86 %
FEV1-Post: 1.38 L
FEV1-Pre: 1.32 L
FEV1FVC-%Change-Post: 2 %
FEV1FVC-%Pred-Pre: 112 %
FEV6-%Change-Post: 4 %
FEV6-%Pred-Post: 83 %
FEV6-%Pred-Pre: 80 %
FEV6-Post: 1.62 L
FEV6-Pre: 1.55 L
FEV6FVC-%Change-Post: 1 %
FEV6FVC-%Pred-Post: 106 %
FEV6FVC-%Pred-Pre: 104 %
FVC-%Change-Post: 2 %
FVC-%Pred-Post: 78 %
FVC-%Pred-Pre: 76 %
FVC-Post: 1.62 L
FVC-Pre: 1.58 L
Post FEV1/FVC ratio: 85 %
Post FEV6/FVC ratio: 100 %
Pre FEV1/FVC ratio: 83 %
Pre FEV6/FVC Ratio: 98 %
RV % pred: 40 %
RV: 0.85 L
TLC % pred: 61 %
TLC: 2.63 L

## 2019-12-21 MED ORDER — ALBUTEROL SULFATE (2.5 MG/3ML) 0.083% IN NEBU
2.5000 mg | INHALATION_SOLUTION | Freq: Once | RESPIRATORY_TRACT | Status: AC
  Administered 2019-12-21: 2.5 mg via RESPIRATORY_TRACT

## 2019-12-21 MED ORDER — TECHNETIUM TO 99M ALBUMIN AGGREGATED
4.3000 | Freq: Once | INTRAVENOUS | Status: AC | PRN
  Administered 2019-12-21: 4.3 via INTRAVENOUS

## 2019-12-22 ENCOUNTER — Ambulatory Visit (INDEPENDENT_AMBULATORY_CARE_PROVIDER_SITE_OTHER): Payer: Medicare Other

## 2019-12-22 ENCOUNTER — Other Ambulatory Visit: Payer: Self-pay | Admitting: Physician Assistant

## 2019-12-22 ENCOUNTER — Telehealth: Payer: Self-pay

## 2019-12-22 DIAGNOSIS — I4819 Other persistent atrial fibrillation: Secondary | ICD-10-CM | POA: Diagnosis not present

## 2019-12-22 DIAGNOSIS — E032 Hypothyroidism due to medicaments and other exogenous substances: Secondary | ICD-10-CM

## 2019-12-22 DIAGNOSIS — R942 Abnormal results of pulmonary function studies: Secondary | ICD-10-CM

## 2019-12-22 NOTE — Telephone Encounter (Signed)
-----   Message from Sueanne Margarita, MD sent at 12/22/2019  7:30 AM EDT ----- PFTs shows moderate restrictive lung disease - please refer to Dr. Chase Caller for evaluation

## 2019-12-25 ENCOUNTER — Other Ambulatory Visit: Payer: Self-pay | Admitting: Cardiology

## 2019-12-25 NOTE — Telephone Encounter (Signed)
Prescription refill request for Eliquis received.  Last office visit: [place], 11/21/2019 Scr: 1.02, 09/28/2019 Age: 79 y.o. Weight: 59.9 kg   Prescription refill sent.

## 2019-12-27 NOTE — Addendum Note (Signed)
Addended by: Freada Bergeron on: 12/27/2019 02:05 PM   Modules accepted: Orders

## 2019-12-29 NOTE — Telephone Encounter (Signed)
Patient is scheduled for lab study on 01/23/20. Patient understands his sleep study will be done at [facility] sleep lab. Patient understands he will receive a sleep packet in a week or so. Patient understands to call if he does not receive the sleep packet in a timely manner.   Scheduled with Bernerd Pho

## 2020-01-01 DIAGNOSIS — I4819 Other persistent atrial fibrillation: Secondary | ICD-10-CM | POA: Diagnosis not present

## 2020-01-03 ENCOUNTER — Telehealth: Payer: Self-pay

## 2020-01-03 ENCOUNTER — Other Ambulatory Visit: Payer: Self-pay | Admitting: Cardiology

## 2020-01-03 MED ORDER — DILTIAZEM HCL ER COATED BEADS 300 MG PO CP24: 300.0000 mg | ORAL_CAPSULE | Freq: Every day | ORAL | 3 refills | Status: DC

## 2020-01-03 NOTE — Telephone Encounter (Signed)
The patient has been notified of the result and verbalized understanding.  All questions (if any) were answered. Antonieta Iba, RN 01/03/2020 4:14 PM  Rx has been sent in. FU has been scheduled.

## 2020-01-03 NOTE — Telephone Encounter (Signed)
-----   Message from Sueanne Margarita, MD sent at 01/03/2020 11:58 AM EDT ----- Average HR too high on  heart monitor.  Increase Cardizem to 300mg  daily  and followup with PA in 2 weeks

## 2020-01-04 ENCOUNTER — Ambulatory Visit (INDEPENDENT_AMBULATORY_CARE_PROVIDER_SITE_OTHER): Payer: Medicare Other | Admitting: Neurology

## 2020-01-04 ENCOUNTER — Encounter: Payer: Self-pay | Admitting: Neurology

## 2020-01-04 VITALS — BP 117/76 | HR 92 | Ht 60.0 in | Wt 134.0 lb

## 2020-01-04 DIAGNOSIS — I6389 Other cerebral infarction: Secondary | ICD-10-CM | POA: Diagnosis not present

## 2020-01-04 DIAGNOSIS — I6990 Unspecified sequelae of unspecified cerebrovascular disease: Secondary | ICD-10-CM

## 2020-01-04 DIAGNOSIS — G3184 Mild cognitive impairment, so stated: Secondary | ICD-10-CM | POA: Diagnosis not present

## 2020-01-04 NOTE — Progress Notes (Signed)
[facility] [address] [place]. [place] [address] [phone]       OFFICE FOLLOW-UP NOTE  [name] Date of Birth:  02-27-1941 Medical Record Number:  [id number]   HPI: Initial visit 03/27/2019 Patricia [name] is a 79 year old pleasant Caucasian lady with past medical history of left breast cancer with radiation, persistent atrial fibrillation on Eliquis, multinodular goiter, laryngeal spasms, essential hypertension, chronic kidney disease, diastolic heart failure, and hyperlipidemia who was last seen in office on 11/19/2016.  She is seen today for follow-up following recent ER visit on November 30, 2018.  She was doing well until 2 days prior when the husband sent her to another room at home to get her checkbook.  The patient recalled trying to move a rolling chair out of her way and suddenly she fell backwards leaning on the wall and slid down to the floor.  The husband arrived a few minutes later and noted that she was unresponsive and was not speaking or answering his questions.  He called EMS and by the time they arrived patient was gradually returning back to normal.  She could not remember the episode and did not have a memory of the event.  She denied any chest pain, sweating, palpitations, headache, tongue bite injury or incontinence.  CT scan of the head was unremarkable and MRI scan showed no evidence of acute stroke or hemorrhage.  EEG showed bitemporal focal slowing.  Patient was on Eliquis for atrial fibrillation and prior stroke which was continued.  She states she is done well since then and has not had any further episodes of office consciousness or seizures.  The patient's husband however feels that is noticed that her memory is not the same and she has had some cognitive decline.  There have been no episodes of confusion, disorientation or agitation.  She has not had any delusions hallucinations.  She has not had any lab work to look for reversible causes of  cognitive impairment.  She denies any headache, falls or injuries.  She feels her balance may be slightly off. Update 06/22/2019 : She returns for follow-up after last visit 6 months ago. She is accompanied by her husband. She states that him memory difficulties are about unchanged. She is started taking Prevagen 10 mg 1 capsule daily and tolerating it well. She has had no further episodes of confusion or seizure-like episode and she remains on Keppra XR 500 mg daily which is tolerating well without side effects. She has had no recurrent stroke or TIAs either. She remains on Eliquis and is tolerating it well with minor bruising but no bleeding. Blood pressure is well controlled though it is elevated today in office. She has been participating in mentally challenging activities and she does have family history of Alzheimer's and worries about it and wants me to try something else to help . Update 01/04/2020: She returns for follow-up after last visit 6 months ago.  She is accompanied by her husband.  She states she is doing well.  Subjective memory difficulties appear about unchanged.  She remains on provision as well as she started taking Cerefolin NAC and is tolerating it well.  She has had no episodes of seizure confusion.  She is tolerating Keppra XR 500 mg without any side effects.  She has had no recurrent stroke or TIAs either.  She remains on Eliquis which is tolerating well without bruising or bleeding.  Her blood pressures well controlled and usually in the 854 systolic range  and today it is 117/74 in office.  She has no new complaints. ROS:   14 system review of systems is positive for confusion, memory loss, disorientation and all other systems negative  PMH:  Past Medical History:  Diagnosis Date  . Allergic rhinitis, cause unspecified   . Aortic regurgitation 06/17/2015   Mild by echo 2018  . 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 Chronic  atrial fibrillation ([place])    a. Dx 04/2015 at time of stroke. b. DCCV 06/2015 - did not hold. Amio started then stopped due to QT prolongation.  . Chronic cough   . Chronic diastolic heart failure ([place]) 06/17/2015  . CKD (chronic kidney disease), stage III ([place])   . Cough variant asthma   . Disease of pharynx or nasopharynx   . Diverticulosis   . Essential hypertension   . GERD (gastroesophageal reflux disease)   . Glaucoma   . Hiatal hernia   . Hyperlipidemia   . Internal hemorrhoids   . Laryngospasm   . Multinodular goiter   . Osteopenia   . Postmenopausal   . Pulmonary HTN (HCC)     Moderate with PASP 20mmHg by echo 2018 likely Group 2 from pulmonary venous HTN from CHF  . Radiation 10/22/14-11/23/14   Left Breast  . Stricture and stenosis of cervix     Social History:  Social History   Socioeconomic History  . Marital status: Married    Spouse name: Not on file  . Number of children: Not on file  . Years of education: Not on file  . Highest education level: Not on file  Occupational History  . Not on file  Tobacco Use  . Smoking status: Former Smoker    Packs/day: 0.75    Years: 5.00    Pack years: 3.75    Quit date: 06/14/1961    Years since quitting: 58.5  . Smokeless tobacco: Never Used  Vaping Use  . Vaping Use: Never used  Substance and Sexual Activity  . Alcohol use: Yes    Alcohol/week: 0.0 standard drinks    Comment: occasional wine  . Drug use: No  . Sexual activity: Not Currently  Other Topics Concern  . Not on file  Social History Narrative   Retired. Lives with husband.    Right Handed   Drinks 1-2 cups caffeine daily   Social Determinants of Health   Financial Resource Strain:   . Difficulty of Paying Living Expenses: Not on file  Food Insecurity:   . Worried About Charity fundraiser in the Last Year: Not on file  . Ran Out of Food in the Last Year: Not on file  Transportation Needs:   . Lack of Transportation (Medical): Not on file  .  Lack of Transportation (Non-Medical): Not on file  Physical Activity:   . Days of Exercise per Week: Not on file  . Minutes of Exercise per Session: Not on file  Stress:   . Feeling of Stress : Not on file  Social Connections:   . Frequency of Communication with Friends and Family: Not on file  . Frequency of Social Gatherings with Friends and Family: Not on file  . Attends Religious Services: Not on file  . Active Member of Clubs or Organizations: Not on file  . Attends Archivist Meetings: Not on file  . Marital Status: Not on file  Intimate Partner  Violence:   . Fear of Current or Ex-Partner: Not on file  . Emotionally Abused: Not on file  . Physically Abused: Not on file  . Sexually Abused: Not on file    Medications:   Current Outpatient Medications on File Prior to Visit  Medication Sig Dispense Refill  . albuterol (PROVENTIL HFA;VENTOLIN HFA) 108 (90 Base) MCG/ACT inhaler Inhale 2 puffs into the lungs every 6 (six) hours as needed for wheezing or shortness of breath.    . anastrozole (ARIMIDEX) 1 MG tablet TAKE 1 TABLET DAILY 90 tablet 3  . Apoaequorin (PREVAGEN) 10 MG CAPS Take 1 capsule by mouth every morning. 1 capsule 1  . ARMOUR THYROID 60 MG tablet TAKE 1 TABLET DAILY BEFORE BREAKFAST 90 tablet 1  . atorvastatin (LIPITOR) 40 MG tablet TAKE 1 TABLET DAILY 90 tablet 3  . calcium-vitamin D (OSCAL WITH D) 500-200 MG-UNIT tablet Take 1 tablet by mouth daily with breakfast. 30 tablet 0  . diltiazem (CARDIZEM CD) 300 MG 24 hr capsule Take 1 capsule (300 mg total) by mouth daily. 90 capsule 3  . ELIQUIS 5 MG TABS tablet TAKE 1 TABLET TWICE A DAY 180 tablet 1  . fluticasone (FLONASE) 50 MCG/ACT nasal spray 1 spray each nostril following sinus rinses twice daily 16 g 2  . fluticasone-salmeterol (ADVAIR HFA) 230-21 MCG/ACT inhaler USE 2 INHALATIONS TWICE A DAY 36 g 4  . furosemide (LASIX) 20 MG tablet TAKE 2 TABLETS EVERY MORNING AND 1 TABLET EVERY EVENING 270 tablet 3   . gabapentin (NEURONTIN) 400 MG capsule Take 1 capsule (400 mg total) by mouth 2 (two) times daily. OFFICE VISIT REQUIRED PRIOR TO ANY FURTHER REFILLS 180 capsule 0  . L-Methylfolate-B12-B6-B2 (CEREFOLIN) 07-31-48-5 MG TABS Take 1 capsule by mouth every morning. 90 tablet 3  . levETIRAcetam (KEPPRA XR) 500 MG 24 hr tablet TAKE 1 TABLET DAILY 90 tablet 3  . Multiple Vitamins-Iron (MULTIVITAMIN/IRON) TABS Take 1 tablet by mouth daily.     Marland Kitchen omeprazole (PRILOSEC) 20 MG capsule Take 1 capsule (20 mg total) by mouth 2 (two) times daily before a meal. 180 capsule 0  . potassium chloride SA (KLOR-CON) 20 MEQ tablet TAKE 1 TABLET TWICE A DAY (KEEP UPCOMING APPOINTMENT IN SEPTEMBER WITH DR TURNER BEFORE ANYMORE REFILLS) 180 tablet 2  . Vitamin D, Ergocalciferol, (DRISDOL) 1.25 MG (50000 UNIT) CAPS capsule TAKE 1 CAPSULE EVERY WEDNESDAY AND SUNDAY 24 capsule 3   Current Facility-Administered Medications on File Prior to Visit  Medication Dose Route Frequency Provider Last Rate Last Admin  . denosumab (PROLIA) injection 60 mg  60 mg Subcutaneous Once Magrinat, Virgie Dad, MD        Allergies:   Allergies  Allergen Reactions  . Combigan [Brimonidine Tartrate-Timolol] Itching    Eyes itch, reddened  . Other Other (See Comments)    Per patient made OU red, Sore, and sensitivity to light  . Sulfa Antibiotics Rash  . Sulfamethoxazole-Trimethoprim Hives    Physical Exam General: Petite elderly Caucasian lady, seated, in no evident distress Head: head normocephalic and atraumatic.  Neck: supple with no carotid or supraclavicular bruits Cardiovascular: regular rate and rhythm, no murmurs Musculoskeletal: Mild kyphoscoliosis Skin:  no rash/petichiae Vascular:  Normal pulses all extremities Vitals:   01/04/20 1308  BP: 117/76  Pulse: 92   Neurologic Exam Mental Status: Awake and fully alert. Oriented to place and time. Recent and remote memory diminished. Attention span, concentration and fund of  knowledge appropriate. Mood and affect appropriate.  Diminished recall 2/3.  Able to name only 9 animals which can walk on 4 legs.  Clock drawing 4/4. Cranial Nerves: Fundoscopic exam not done pupils equal, briskly reactive to light. Extraocular movements full without nystagmus. Visual fields full to confrontation. Hearing intact. Facial sensation intact. Face, tongue, palate moves normally and symmetrically.  Motor: Normal bulk and tone. Normal strength in all tested extremity muscles. Sensory.: intact to touch ,pinprick .position and vibratory sensation.  Coordination: Rapid alternating movements normal in all extremities. Finger-to-nose and heel-to-shin performed accurately bilaterally. Gait and Station: Arises from chair without difficulty. Stance is normal. Gait demonstrates normal stride length and balance . Able to heel, toe and tandem walk with moderate difficulty.  Reflexes: 1+ and symmetric. Toes downgoing.       ASSESSMENT: 79 year old Caucasian lady with remote history of right MCA infarct from atrial fibrillation in February 2017 with vascular risk factors of hypertension hyperlipidemia.  Recent episode of brief loss of consciousness followed by confusion possibly unwitnessed seizure with postictal confusion in September 2020.  Also   mild cognitive impairment which appears stable.     PLAN: I had a long discussion with the patient and husband regarding episode of confusion, memory loss and mild cognitive impairment and answered questions. I recommend she continue Prevagen 10 mg and  Cerefolin NAC 1 capsule daily to help with cognitive impairment as well as participate in mentally challenging activities like solving crossword puzzles, playing bridge and sudoku.    We also discussed memory compensation strategies.  She was advised to continue Keppra XR 500 mg daily for seizure prophylaxis and continue Eliquis for stroke prevention for atrial fibrillation and maintain aggressive risk  factor modification.  Check follow-up screening carotid ultrasound study. She will return for follow-up for in person visit in 6 months or call earlier if necessary. Greater than 50% of time during this 25 minute visit was spent on counseling,explanation of diagnosis, planning of further management, discussion with patient and family and coordination of care Antony Contras, MD Note: This document was prepared with digital dictation and possible smart phrase technology. Any transcriptional errors that result from this process are unintentional

## 2020-01-04 NOTE — Patient Instructions (Addendum)
I had a long discussion with the patient and husband regarding episode of confusion, memory loss and mild cognitive impairment and answered questions. I recommend she continue Prevagen 10 mg and  Cerefolin NAC 1 capsule daily to help with cognitive impairment as well as participate in mentally challenging activities like solving crossword puzzles, playing bridge and sudoku.    We also discussed memory compensation strategies.  She was advised to continue Keppra XR 500 mg daily for seizure prophylaxis and continue Eliquis for stroke prevention for atrial fibrillation and maintain aggressive risk factor modification.  Check follow-up screening carotid ultrasound study. She will return for follow-up for in person visit in 6 months or call earlier if necessary.

## 2020-01-12 ENCOUNTER — Other Ambulatory Visit: Payer: Self-pay | Admitting: Cardiology

## 2020-01-17 ENCOUNTER — Other Ambulatory Visit: Payer: Self-pay | Admitting: Physician Assistant

## 2020-01-17 DIAGNOSIS — I4819 Other persistent atrial fibrillation: Secondary | ICD-10-CM

## 2020-01-17 DIAGNOSIS — I351 Nonrheumatic aortic (valve) insufficiency: Secondary | ICD-10-CM

## 2020-01-17 DIAGNOSIS — I5032 Chronic diastolic (congestive) heart failure: Secondary | ICD-10-CM

## 2020-01-17 DIAGNOSIS — I10 Essential (primary) hypertension: Secondary | ICD-10-CM

## 2020-01-18 ENCOUNTER — Encounter (HOSPITAL_BASED_OUTPATIENT_CLINIC_OR_DEPARTMENT_OTHER): Payer: Self-pay

## 2020-01-19 ENCOUNTER — Telehealth: Payer: Self-pay | Admitting: Cardiology

## 2020-01-19 NOTE — Telephone Encounter (Signed)
Patient husband is following up regarding split night study scheduled for 01/23/20 with Dr. Radford Pax (see patient message from 01/18/20). They would like to discuss instructions for this appointment. He states [facility] and [facility], where this appointment will take place, however he has not heard back from anyone. Please return call to discuss.

## 2020-01-20 NOTE — Telephone Encounter (Signed)
°  Patient is scheduled for lab study on 01/23/20. Patient understands his sleep study will be done at [facility] sleep lab. Patient understands he will receive a sleep packet in a week or so. Patient understands to call if he does not receive the sleep packet in a timely manner.

## 2020-01-23 ENCOUNTER — Encounter: Payer: Self-pay | Admitting: Internal Medicine

## 2020-01-23 ENCOUNTER — Other Ambulatory Visit: Payer: Self-pay

## 2020-01-23 ENCOUNTER — Ambulatory Visit (INDEPENDENT_AMBULATORY_CARE_PROVIDER_SITE_OTHER): Payer: Medicare Other | Admitting: Internal Medicine

## 2020-01-23 ENCOUNTER — Encounter (HOSPITAL_BASED_OUTPATIENT_CLINIC_OR_DEPARTMENT_OTHER): Payer: Self-pay | Admitting: Cardiology

## 2020-01-23 ENCOUNTER — Ambulatory Visit (HOSPITAL_BASED_OUTPATIENT_CLINIC_OR_DEPARTMENT_OTHER): Payer: Medicare Other | Attending: Cardiology | Admitting: Cardiology

## 2020-01-23 VITALS — BP 118/68 | HR 101 | Temp 97.9°F | Ht 60.0 in | Wt 134.8 lb

## 2020-01-23 DIAGNOSIS — I519 Heart disease, unspecified: Secondary | ICD-10-CM | POA: Insufficient documentation

## 2020-01-23 DIAGNOSIS — I517 Cardiomegaly: Secondary | ICD-10-CM | POA: Diagnosis not present

## 2020-01-23 DIAGNOSIS — I4811 Longstanding persistent atrial fibrillation: Secondary | ICD-10-CM | POA: Diagnosis not present

## 2020-01-23 DIAGNOSIS — G4733 Obstructive sleep apnea (adult) (pediatric): Secondary | ICD-10-CM | POA: Diagnosis not present

## 2020-01-23 DIAGNOSIS — I4891 Unspecified atrial fibrillation: Secondary | ICD-10-CM | POA: Diagnosis not present

## 2020-01-23 DIAGNOSIS — R053 Chronic cough: Secondary | ICD-10-CM | POA: Diagnosis not present

## 2020-01-23 HISTORY — DX: Cardiomegaly: I51.7

## 2020-01-23 NOTE — Progress Notes (Signed)
Patricia [name]    [id number]    [birth year]  Primary Care Physician:Abonza, Samantha Crimes  Referring Physician: Sueanne Margarita, MD [phone] [address] [place],  [place] [address] Reason for Consultation: shortness of breath, abnormal PFT Date of Consultation: 01/23/2020  Chief complaint:   Chief Complaint  Patient presents with  . Consult    review of PFT      HPI: Patricia [name] is a 79 y.o. woman with history of ER positive breast cancer on anastrazole, atrial fibrillation on eliquis who presents for new patient evaluation. 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   Has poorly controlled atrial fibrillation s/p cardioversion x2. Had PFTs ordered as a part of her work up for a fib. Has sleep study tonight. PFT showed mild restriction to ventilation.  She denies any dyspnea. No difficulty with ADLs. Very remote former smoker. No recurrent pneumonia or bronchitis.  She does have daily chronic cough which is dry, occasional phlegm. Made worse usually with walking, but occurs with sitting. No nocturnal symptoms. She does have frequent throat clearing and nasal drainage. She is on gabapentin for cough related to laryngospasm- prescribed by primary care.    She takes advair twice a day. Albuterol inhaler use hasn't been in months. Cough gets worse if she doesn't take advair. She isn't sure exactly why she is on this, but has been on this for a long time.    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 Originally from [place] Exposures: Lives at home with husband.  Smoking history: remote in the 1960s  Social History   Occupational History  . Not on file  Tobacco Use  . Smoking status: Former Smoker    Packs/day: 0.75    Years: 5.00    Pack years: 3.75    Types: Cigarettes    Quit date: 06/15/1966    Years since quitting: 53.6  . Smokeless tobacco: Never Used  Vaping Use  . Vaping Use: Never used  Substance and  Sexual Activity  . Alcohol use: Yes    Alcohol/week: 0.0 standard drinks    Comment: occasional wine  . Drug use: No  . Sexual activity: Not Currently    Relevant family history: Family History  Problem Relation Age of Onset  . Stroke Mother   . Hypertension Mother   . Hyperlipidemia Mother   . Stroke Father   . Transient ischemic attack Father   . Hyperlipidemia Father   . Hypertension Father   . Atrial fibrillation Son   . Heart attack Neg Hx     Past Medical History:  Diagnosis Date  . Allergic rhinitis, cause unspecified   . Aortic regurgitation 06/17/2015   Mild by echo 2018  . 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 Chronic atrial fibrillation ([place])    a. Dx 04/2015 at time of stroke. b. DCCV 06/2015 - did not hold. Amio started then stopped due to QT prolongation.  . Chronic cough   . Chronic diastolic heart failure ([place]) 06/17/2015  . CKD (chronic kidney disease), stage III ([place])   . Cough variant asthma   . Disease of pharynx or nasopharynx   . Diverticulosis   . Essential hypertension   . GERD (gastroesophageal reflux disease)   . Glaucoma   . Hiatal hernia   .  Hyperlipidemia   . Internal hemorrhoids   . Laryngospasm   . Multinodular goiter   . Osteopenia   . Postmenopausal   . Pulmonary HTN (HCC)     Moderate with PASP 28mmHg by echo 2018 likely Group 2 from pulmonary venous HTN from CHF  . Radiation 10/22/14-11/23/14   Left Breast  . Stricture and stenosis of cervix     Past Surgical History:  Procedure Laterality Date  . BREAST LUMPECTOMY Left   . BREAST SURGERY    . CARDIOVERSION N/A 06/24/2015   Procedure: CARDIOVERSION;  Surgeon: Sueanne Margarita, MD;  Location: [place];  Service: Cardiovascular;  Laterality: N/A;  . CARDIOVERSION N/A 05/19/2016   Procedure: CARDIOVERSION;  Surgeon: Sueanne Margarita, MD;  Location: [facility];  Service: Cardiovascular;  Laterality: N/A;  . COLONOSCOPY    .  LUNG CANCER SURGERY  2003   resection carcinoid lingula-lt upper lobe  . RADIOACTIVE SEED GUIDED PARTIAL MASTECTOMY WITH AXILLARY SENTINEL LYMPH NODE BIOPSY Left 06/26/2014   Procedure: RADIOACTIVE SEED GUIDED PARTIAL MASTECTOMY WITH AXILLARY SENTINEL LYMPH NODE BIOPSY;  Surgeon: Stark Klein, MD;  Location: [place];  Service: General;  Laterality: Left;  . RE-EXCISION OF BREAST LUMPECTOMY Left 08/07/2014   Procedure: RE-EXCISION OF LEFT BREAST LUMPECTOMY;  Surgeon: Stark Klein, MD;  Location: [place];  Service: General;  Laterality: Left;  . RIGHT HEART CATH N/A 04/27/2016   Procedure: Right Heart Cath;  Surgeon: Larey Dresser, MD;  Location: [place] [facility];  Service: Cardiovascular;  Laterality: N/A;     Physical Exam: Blood pressure 118/68, pulse (!) 101, temperature 97.9 F (36.6 C), height 5' (1.524 m), weight 134 lb 12.8 oz (61.1 kg), SpO2 98 %. Gen:      No acute distress, occasional dry cough. ENT:  no nasal polyps, mucus membranes moist Lungs:    No increased respiratory effort, symmetric chest wall excursion, clear to auscultation bilaterally, no wheezes or crackles CV:         Regular rate and rhythm; no murmurs, rubs, or gallops.  No pedal edema Abd:      + bowel sounds; soft, non-tender; no distension MSK: no acute synovitis of DIP or PIP joints, no mechanics hands. Mild osteoarthritis changes Skin:      Warm and dry; no rashes Neuro: normal speech, no focal facial asymmetry Psych: alert and oriented x3, normal mood and affect   Data Reviewed/Medical Decision Making:  Independent interpretation of tests: Imaging: . Review of patient's CT Chest 2017 images revealed post-surgical changes in the left lung. The patient's images have been independently reviewed by me.    PFTs: I have personally reviewed the patient's PFTs and there is mild restriction to ventilation. Lung function is stable since 2017. PFT Results Latest Ref Rng & Units  12/21/2019 09/17/2015  FVC-Pre L 1.58 1.52  FVC-Predicted Pre % 76 71  FVC-Post L 1.62 1.45  FVC-Predicted Post % 78 68  Pre FEV1/FVC % % 83 85  Post FEV1/FCV % % 85 86  FEV1-Pre L 1.32 1.28  FEV1-Predicted Pre % 86 81  FEV1-Post L 1.38 1.25  DLCO uncorrected ml/min/mmHg 9.51 11.72  DLCO UNC% % 60 72  DLCO corrected ml/min/mmHg - 11.32  DLCO COR %Predicted % - 69  DLVA Predicted % 95 97  TLC L 2.63 3.48  TLC % Predicted % 61 83  RV % Predicted % 40 84    Labs:  Lab Results  Component Value Date  WBC 6.7 09/28/2019   HGB 14.5 09/28/2019   HCT 44.0 09/28/2019   MCV 95 09/28/2019   PLT 234 09/28/2019   Lab Results  Component Value Date   NA 141 09/28/2019   K 4.2 09/28/2019   CL 104 09/28/2019   CO2 26 09/28/2019     Immunization status:  Immunization History  Administered Date(s) Administered  . Hepatitis A 10/01/2015  . Hepatitis A, Adult 12/29/2012  . Influenza Split 12/11/2010, 12/01/2011  . Influenza Whole 01/21/2007, 01/01/2009, 11/28/2009  . Influenza, High Dose Seasonal PF 12/01/2013, 12/14/2014, 12/02/2015, 12/18/2016, 12/14/2017  . Influenza,inj,Quad PF,6+ Mos 12/29/2012, 12/01/2013  . Influenza-Unspecified 12/18/2016, 12/14/2019  . PFIZER SARS-COV-2 Vaccination 03/14/2019, 04/03/2019, 12/18/2019  . Pneumococcal Conjugate-13 02/08/2014  . Pneumococcal Polysaccharide-23 03/02/2005, 01/26/2011  . Td 03/02/2000, 12/29/2012  . Typhoid Inactivated 10/01/2015  . Yellow Fever 10/01/2015  . Zoster 05/03/2006    . I reviewed prior external note(s) from cardiology, primary care, oncology . I reviewed the result(s) of the labs and imaging as noted above.    Assessment:  Ms. Helbling is a 79 y.o. woman with history of breast cancer and carcinoid tumor s/p resection who presents with:  Chronic cough Mild restriction to ventilation Atrial fibrillation  Plan/Recommendations: She presents for follow-up today after abnormal PFTs in the process of working up  causes for her persistent atrial fibrillation.  She has some mild restriction to ventilation but given that she has no crackles, no clubbing no hypoxemia, and has stable PFTs since her evaluation in 2017, I do not think that there is any evidence of interstitial lung disease.  Her CT chest in 2017 was also clear.  She does have chronic cough and has some postnasal drip I have asked her to start taking Flonase which she was previously prescribed.  She is also on Neurontin and Advair.  Should her cough symptoms not improve she can come back and see me in 6 months.  I do not think there is anything her lungs that would be responsible for causing persistent atrial fibrillation.  Agree with sleep study to complete evaluation for A. fib.  I am happy to see her back as needed if her symptoms do not improve.   Return to Care: Return if symptoms worsen or fail to improve.  Lenice Llamas, MD Pulmonary and [place]  CC: Sueanne Margarita, MD

## 2020-01-23 NOTE — Patient Instructions (Addendum)
Please come see me again as needed if breathing changes or cough gets worse.   Flonase - 1 spray on each side of your nose twice a day for first week, then 1 spray on each side.   Instructions for use:  If you also use a saline nasal spray or rinse, use that first.  Position the head with the chin slightly tucked. Use the right hand to spray into the left nostril and the right hand to spray into the left nostril.   Point the bottle away from the septum of your nose (cartilage that divides the two sides of your nose).   Hold the nostril closed on the opposite side from where you will spray  Spray once and gently sniff to pull the medicine into the higher parts of your nose.  Don't sniff too hard as the medicine will drain down the back of your throat instead.  Repeat with a second spray on the same side if prescribed.  Repeat on the other side of your nose.

## 2020-01-28 NOTE — Procedures (Signed)
   Patient Name: Patricia [name], Patricia [name] Date: 01/23/2020 Gender: Female D.O.B: [birth year] Age (years): 74 Referring Provider: Fransico Him MD, ABSM Height (inches): 60 Interpreting Physician: Fransico Him MD, ABSM Weight (lbs): 134 RPSGT: Carolin Coy BMI: 26 MRN: [id number] Neck Size: 13.50  CLINICAL INFORMATION Sleep Study Type: NPSG  Indication for sleep study: Hypertension, Snoring  Epworth Sleepiness Score: 4  SLEEP STUDY TECHNIQUE As per the AASM Manual for the Scoring of Sleep and Associated Events v2.3 (April 2016) with a hypopnea requiring 4% desaturations.  The channels recorded and monitored were frontal, central and occipital EEG, electrooculogram (EOG), submentalis EMG (chin), nasal and oral airflow, thoracic and abdominal wall motion, anterior tibialis EMG, snore microphone, electrocardiogram, and pulse oximetry.  MEDICATIONS Medications self-administered by patient taken the night of the study : N/A  SLEEP ARCHITECTURE The study was initiated at 10:19:41 PM and ended at 4:42:44 AM.  Sleep onset time was 13.7 minutes and the sleep efficiency was 52.0%. The total sleep time was 199.3 minutes.  Stage REM latency was N/A minutes.  The patient spent 19.7% of the night in stage N1 sleep, 80.3% in stage N2 sleep, 0.0% in stage N3 and 0% in REM.  Alpha intrusion was absent.  Supine sleep was 0.00%.  RESPIRATORY PARAMETERS The overall apnea/hypopnea index (AHI) was 9.6 per hour. There were 1 total apneas, including 0 obstructive, 1 central and 0 mixed apneas. There were 31 hypopneas and 87 RERAs.  The AHI during Stage REM sleep was N/A per hour.  AHI while supine was N/A per hour.  The mean oxygen saturation was 94.1%. The minimum SpO2 during sleep was 92.0%.  soft snoring was noted during this study.  CARDIAC DATA The 2 lead EKG demonstrated atrial fibrillation. The mean heart rate was 94.4 beats per minute.   LEG MOVEMENT DATA The total PLMS were 0  with a resulting PLMS index of 0.0. Associated arousal with leg movement index was 8.1 .  IMPRESSIONS - Mild obstructive sleep apnea occurred during this study (AHI = 9.6/h). - No significant central sleep apnea occurred during this study (CAI = 0.3/h). - The patient had minimal or no oxygen desaturation during the study (Min O2 = 92.0%) - The patient snored with soft snoring volume. - EKG findings include atrial fibrillation - Clinically significant periodic limb movements did not occur during sleep. Associated arousals were significant.  DIAGNOSIS - Obstructive Sleep Apnea (G47.33)  RECOMMENDATIONS - Therapeutic CPAP titration to determine optimal pressure required to alleviate sleep disordered breathing. - Avoid alcohol, sedatives and other CNS depressants that may worsen sleep apnea and disrupt normal sleep architecture. - Sleep hygiene should be reviewed to assess factors that may improve sleep quality. - Weight management and regular exercise should be initiated or continued if appropriate.  [Electronically signed] 01/28/2020 09:04 AM  Fransico Him MD, ABSM Diplomate, American Board of Sleep Medicine

## 2020-01-31 ENCOUNTER — Ambulatory Visit: Payer: Medicare Other | Admitting: Physician Assistant

## 2020-01-31 ENCOUNTER — Telehealth: Payer: Self-pay | Admitting: *Deleted

## 2020-01-31 ENCOUNTER — Ambulatory Visit (HOSPITAL_COMMUNITY)
Admission: RE | Admit: 2020-01-31 | Discharge: 2020-01-31 | Disposition: A | Discharge status: Home / Self Care | Payer: Medicare Other | Source: Ambulatory Visit | Attending: Neurology | Admitting: Neurology

## 2020-01-31 ENCOUNTER — Other Ambulatory Visit: Payer: Self-pay

## 2020-01-31 DIAGNOSIS — I6990 Unspecified sequelae of unspecified cerebrovascular disease: Secondary | ICD-10-CM | POA: Diagnosis not present

## 2020-01-31 DIAGNOSIS — G4733 Obstructive sleep apnea (adult) (pediatric): Secondary | ICD-10-CM

## 2020-01-31 NOTE — Telephone Encounter (Signed)
Informed patient of sleep study results and patient understanding was verbalized. Patient understandshersleep study showed they have sleep apnea and recommend CPAP titration. Please set up titration in the sleep lab.   IMPRESSIONS - Mild obstructive sleep apnea occurred during this study (AHI = 9.6/h). - The patient had minimal or no oxygen desaturation during the study (Min O2 = 92.0%) - The patient snored with soft snoring volume. - EKG findings include atrial fibrillation - Clinically significant periodic limb movements did not occur during sleep. Associated arousals were significant.  DIAGNOSIS - Obstructive Sleep Apnea (G47.33)  RECOMMENDATIONS - Therapeutic CPAP titration to determine optimal pressure required to alleviate sleep disordered breathing  Titration sent to sleep pool Pt is aware and agreeable to her results

## 2020-01-31 NOTE — Telephone Encounter (Signed)
-----   Message from Sueanne Margarita, MD sent at 01/28/2020  9:09 AM EST ----- Please let patient know that they have sleep apnea and recommend CPAP titration. Please set up titration in the sleep lab.

## 2020-01-31 NOTE — Progress Notes (Signed)
Carotid duplex has been completed.   Preliminary results in CV Proc.   Abram Sander 01/31/2020 10:02 AM

## 2020-02-04 NOTE — Progress Notes (Signed)
Kindly inform the patient that carotid ultrasound shows no significant blockages of either carotid artery in the neck.

## 2020-02-05 ENCOUNTER — Encounter: Payer: Self-pay | Admitting: *Deleted

## 2020-02-12 ENCOUNTER — Encounter: Payer: Self-pay | Admitting: Cardiology

## 2020-02-12 ENCOUNTER — Other Ambulatory Visit: Payer: Self-pay

## 2020-02-12 ENCOUNTER — Ambulatory Visit (INDEPENDENT_AMBULATORY_CARE_PROVIDER_SITE_OTHER): Payer: Medicare Other | Admitting: Cardiology

## 2020-02-12 VITALS — BP 110/70 | HR 107 | Ht 60.0 in | Wt 134.2 lb

## 2020-02-12 DIAGNOSIS — I4819 Other persistent atrial fibrillation: Secondary | ICD-10-CM

## 2020-02-12 DIAGNOSIS — I340 Nonrheumatic mitral (valve) insufficiency: Secondary | ICD-10-CM | POA: Diagnosis not present

## 2020-02-12 DIAGNOSIS — I10 Essential (primary) hypertension: Secondary | ICD-10-CM

## 2020-02-12 DIAGNOSIS — I272 Pulmonary hypertension, unspecified: Secondary | ICD-10-CM

## 2020-02-12 DIAGNOSIS — I6389 Other cerebral infarction: Secondary | ICD-10-CM

## 2020-02-12 DIAGNOSIS — I351 Nonrheumatic aortic (valve) insufficiency: Secondary | ICD-10-CM

## 2020-02-12 DIAGNOSIS — I5032 Chronic diastolic (congestive) heart failure: Secondary | ICD-10-CM

## 2020-02-12 MED ORDER — METOPROLOL SUCCINATE ER 25 MG PO TB24: 25.0000 mg | ORAL_TABLET | Freq: Every day | ORAL | 3 refills | Status: DC

## 2020-02-12 NOTE — Progress Notes (Signed)
Cardiology Office Note:    Date:  02/12/2020   ID:  Patricia [name], DOB Sep 28, 1940, MRN [id number]  PCP:  Patricia Reid, PA-C  Cardiologist:  Patricia Him, MD   Referring MD: Patricia Reid, PA-C   Chief Complaint  Patient presents with   Congestive Heart Failure   Aortic Insuffiency   New Patient (Initial Visit)   Hypertension   Atrial Fibrillation    History of Present Illness:    Patricia [name] is a 79 y.o. female with history of chronic AFib on Eliquis (amiodarone stopped because of prolonged QT), CVA, chronic diastolic CHF, mild  AI, normal NST 2017, lung CA resection 2003, breast CA s/p lumpectomy/Tamoxifen/XRT, HTN, HLD.  2D echo in 2018 showed moderate PHTN with PASP 79mmHg and RHC 04/2016 with borderline pulmonary HTN.  Repeat 2D echo 12/2019 showed normal LVF with mild to moderate MR and normal PASP.    She is here today for followup and is doing well.  She denies any chest pain or pressure, SOB, DOE, PND, orthopnea, LE edema, dizziness, palpitations or syncope. She is compliant with her meds and is tolerating meds with no SE.    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 Dx 04/2015 at time of stroke. b. DCCV 06/2015 - did not hold. Amio started then stopped due to QT prolongation.   Chronic cough    Chronic diastolic heart failure ([place]) 06/17/2015   CKD (chronic kidney disease), stage III (HCC)    Cough variant asthma    Disease of pharynx or nasopharynx    Diverticulosis    Enlargement of right atrium 01/23/2020   Essential hypertension    GERD (gastroesophageal reflux disease)    Glaucoma    Hiatal hernia    Hyperlipidemia    Internal hemorrhoids    Laryngospasm    Multinodular goiter    Osteopenia    Postmenopausal     Pulmonary HTN (HCC)     Moderate with PASP 78mmHg by echo 2018 likely Group 2 from pulmonary venous HTN from CHF   Radiation 10/22/14-11/23/14   Left Breast   Stricture and stenosis of cervix     Past Surgical History:  Procedure Laterality Date   BREAST LUMPECTOMY Left    BREAST SURGERY     CARDIOVERSION N/A 06/24/2015   Procedure: CARDIOVERSION;  Surgeon: Sueanne Margarita, MD;  Location: [place];  Service: Cardiovascular;  Laterality: N/A;   CARDIOVERSION N/A 05/19/2016   Procedure: CARDIOVERSION;  Surgeon: Sueanne Margarita, MD;  Location: [facility];  Service: Cardiovascular;  Laterality: N/A;   COLONOSCOPY     LUNG CANCER SURGERY  2003   resection carcinoid lingula-lt upper lobe   RADIOACTIVE SEED GUIDED PARTIAL MASTECTOMY WITH AXILLARY SENTINEL LYMPH NODE BIOPSY Left 06/26/2014   Procedure: RADIOACTIVE SEED GUIDED PARTIAL MASTECTOMY WITH AXILLARY SENTINEL LYMPH NODE BIOPSY;  Surgeon: Stark Klein, MD;  Location: [place];  Service: General;  Laterality: Left;   RE-EXCISION OF BREAST LUMPECTOMY Left 08/07/2014   Procedure: RE-EXCISION OF LEFT BREAST LUMPECTOMY;  Surgeon: Stark Klein, MD;  Location: [place];  Service: General;  Laterality: Left;   RIGHT HEART CATH N/A 04/27/2016   Procedure: Right Heart Cath;  Surgeon: Kirk Ruths  Claris Gladden, MD;  Location: [place] [facility];  Service: Cardiovascular;  Laterality: N/A;    Current Medications: Current Meds  Medication Sig   albuterol (PROVENTIL HFA;VENTOLIN HFA) 108 (90 Base) MCG/ACT inhaler Inhale 2 puffs into the lungs every 6 (six) hours as needed for wheezing or shortness of breath.   anastrozole (ARIMIDEX) 1 MG tablet TAKE 1 TABLET DAILY   Apoaequorin (PREVAGEN) 10 MG CAPS Take 1 capsule by mouth every morning.   ARMOUR THYROID 60 MG tablet TAKE 1 TABLET DAILY BEFORE BREAKFAST   atorvastatin (LIPITOR) 40 MG tablet Take 1 tablet (40 mg total) by mouth daily. Please keep upcoming  appointment for future refills. Thank you   calcium-vitamin D (OSCAL WITH D) 500-200 MG-UNIT tablet Take 1 tablet by mouth daily with breakfast.   diltiazem (CARDIZEM CD) 300 MG 24 hr capsule Take 1 capsule (300 mg total) by mouth daily.   ELIQUIS 5 MG TABS tablet TAKE 1 TABLET TWICE A DAY   fluticasone (FLONASE) 50 MCG/ACT nasal spray 1 spray each nostril following sinus rinses twice daily   fluticasone-salmeterol (ADVAIR HFA) 230-21 MCG/ACT inhaler USE 2 INHALATIONS TWICE A DAY   furosemide (LASIX) 20 MG tablet TAKE 2 TABLETS EVERY MORNING AND 1 TABLET EVERY EVENING   gabapentin (NEURONTIN) 400 MG capsule TAKE 1 CAPSULE TWICE A DAY. OFFICE VISIT REQUIRED PRIOR TO ANY FURTHER REFILLS.   L-Methylfolate-B12-B6-B2 (CEREFOLIN) 07-31-48-5 MG TABS Take 1 capsule by mouth every morning.   levETIRAcetam (KEPPRA XR) 500 MG 24 hr tablet TAKE 1 TABLET DAILY   Multiple Vitamins-Iron (MULTIVITAMIN/IRON) TABS Take 1 tablet by mouth daily.   omeprazole (PRILOSEC) 20 MG capsule Take 1 capsule (20 mg total) by mouth 2 (two) times daily before a meal.   potassium chloride SA (KLOR-CON) 20 MEQ tablet TAKE 1 TABLET TWICE A DAY (KEEP UPCOMING APPOINTMENT IN SEPTEMBER WITH DR Marcha Licklider BEFORE ANYMORE REFILLS)   Vitamin D, Ergocalciferol, (DRISDOL) 1.25 MG (50000 UNIT) CAPS capsule TAKE 1 CAPSULE EVERY WEDNESDAY AND SUNDAY     Allergies:   Combigan [brimonidine tartrate-timolol], Other, Sulfa antibiotics, and Sulfamethoxazole-trimethoprim   Social History   Socioeconomic History   Marital status: Married    Spouse name: Not on file   Number of children: Not on file   Years of education: Not on file   Highest education level: Not on file  Occupational History   Not on file  Tobacco Use   Smoking status: Former Smoker    Packs/day: 0.75    Years: 5.00    Pack years: 3.75    Types: Cigarettes    Quit date: 06/15/1966    Years since quitting: 53.6   Smokeless tobacco: Never Used  Vaping Use    Vaping Use: Never used  Substance and Sexual Activity   Alcohol use: Yes    Alcohol/week: 0.0 standard drinks    Comment: occasional wine   Drug use: No   Sexual activity: Not Currently  Other Topics Concern   Not on file  Social History Narrative   Retired. Lives with husband.    Right Handed   Drinks 1-2 cups caffeine daily   Social Determinants of Health   Financial Resource Strain: Not on file  Food Insecurity: Not on file  Transportation Needs: Not on file  Physical Activity: Not on file  Stress: Not on file  Social Connections: Not on file     Family History: The patient's family history includes Atrial fibrillation in her son; Hyperlipidemia in her father and  mother; Hypertension in her father and mother; Stroke in her father and mother; Transient ischemic attack in her father. There is no history of Heart attack.  ROS:   Please see the history of present illness.    ROS  All other systems reviewed and negative.   EKGs/Labs/Other Studies Reviewed:    The following studies were reviewed today:  2D echo 12/2019 IMPRESSIONS   1. Left ventricular ejection fraction, by estimation, is 55 to 60%. The  left ventricle has normal function. The left ventricle has no regional  wall motion abnormalities. There is mild left ventricular hypertrophy.  Left ventricular diastolic parameters  are indeterminate.  2. Right ventricular systolic function is mildly reduced. The right  ventricular size is normal. There is mildly elevated pulmonary artery  systolic pressure.  3. Left atrial size was severely dilated.  4. Right atrial size was severely dilated.  5. The mitral valve is normal in structure. Mild to moderate mitral valve  regurgitation. ERO 0.19 cm^2, RV 23 cc. Moderate mitral annular  calcification.  6. Tricuspid valve regurgitation is moderate.  7. The aortic valve is tricuspid. Aortic valve regurgitation is mild.  Mild to moderate aortic valve  sclerosis/calcification is present, without  any evidence of aortic stenosis.   EKG:  EKG is  ordered today.  The ekg ordered today demonstrates atrial fibrillation with RVR at 107bpm Recent Labs: 09/28/2019: ALT 36; BUN 18; Creatinine, Ser 1.02; Hemoglobin 14.5; Platelets 234; Potassium 4.2; Sodium 141; TSH 2.940   Recent Lipid Panel    Component Value Date/Time   CHOL 139 09/28/2019 0847   TRIG 86 09/28/2019 0847   TRIG 52 01/13/2006 1012   HDL 47 09/28/2019 0847   CHOLHDL 3.0 09/28/2019 0847   CHOLHDL 3 04/01/2016 0952   VLDL 19.2 04/01/2016 0952   LDLCALC 75 09/28/2019 0847   LDLDIRECT 174.3 06/04/2009 0000    Physical Exam:    VS:  BP 110/70    Pulse (!) 107    Ht 5' (1.524 m)    Wt 134 lb 3.2 oz (60.9 kg)    SpO2 95%    BMI 26.21 kg/m     Wt Readings from Last 3 Encounters:  02/12/20 134 lb 3.2 oz (60.9 kg)  01/23/20 134 lb (60.8 kg)  01/23/20 134 lb 12.8 oz (61.1 kg)     GEN: Well nourished, well developed in no acute distress HEENT: Normal NECK: No JVD; No carotid bruits LYMPHATICS: No lymphadenopathy CARDIAC:irregularly irregular, no murmurs, rubs, gallops RESPIRATORY:  Clear to auscultation without rales, wheezing or rhonchi  ABDOMEN: Soft, non-tender, non-distended MUSCULOSKELETAL:  No edema; No deformity  SKIN: Warm and dry NEUROLOGIC:  Alert and oriented x 3 PSYCHIATRIC:  Normal affect   ASSESSMENT:    1. Chronic diastolic heart failure ([place])   2. Nonrheumatic aortic valve insufficiency   3. Essential hypertension   4. Persistent atrial fibrillation ([place])   5. Pulmonary HTN ([place])   6. Nonrheumatic mitral valve regurgitation    PLAN:    In order of problems listed above:  1.  Chronic diastolic CHF  -she appears euvolemic on exam today -continue on lasix 40mg  daily -SCr 1.02 and K+ 4.2 in July 2021  2.  Aortic insufficiency -mild AI by echo 12/2019  3.  HTN  -BP controlled on exam today -continue Cardizem CD 180mg  daily  4.  Persistent  atrial fibrillation  -she reversion back to afib ager her DCCV - Her LA is severely dilated so not a candidate  for ablation.   -She did not tolerate amio due to prolonged QT so likely would happen with other Class III AAD.   -She is completely asymptomatic and now on rate control therapy.   -HR borderline controlled on exam today>>she wore a heart monitor a few weeks ago that showed an elevated HR and Cardizem was increased -continue on Cardizem CD and Eliquis 5mg  BID for CHADS2VASC score of 5.   -add Toprol XL 25mg  daily and repeat a 24 hour Ziopatch to assess for adequacy of HR control -she denies any bleeding issues on DOAC -SCr normal at 1.02 and Hbg 14.5 in July 2021  5.  Pulmonary HTN  - RHC with no significant pulmonary HTN with mean PAP 70mmHg and PCWP elevated at 34mmHg in the setting of afib.   -2D echo 12/2019 with no PHTN  6.  Mitral Regurgitation -mild to moderate by echo 12/2019 -repeat echo in 1 year   Medication Adjustments/Labs and Tests Ordered: Current medicines are reviewed at length with the patient today.  Concerns regarding medicines are outlined above.  Orders Placed This Encounter  Procedures   EKG 12-Lead   No orders of the defined types were placed in this encounter.   Signed, Patricia Him, MD  02/12/2020 2:47 PM    [place]

## 2020-02-12 NOTE — Patient Instructions (Signed)
Medication Instructions:  Your physician has recommended you make the following change in your medication:  1) START taking metoprolol 25 mg daily  *If you need a refill on your cardiac medications before your next appointment, please call your pharmacy*  Testing/Procedures: Your physician has recommended that you wear an event monitor. Event monitors are medical devices that record the heart's electrical activity. Doctors most often [name] these monitors to diagnose arrhythmias. Arrhythmias are problems with the speed or rhythm of the heartbeat. The monitor is a small, portable device. You can wear one while you do your normal daily activities. This is usually used to diagnose what is causing palpitations/syncope (passing out).  Your physician has requested that you have an echocardiogram in October 2022. Echocardiography is a painless test that uses sound waves to create images of your heart. It provides your doctor with information about the size and shape of your heart and how well your heart's chambers and valves are working. This procedure takes approximately one hour. There are no restrictions for this procedure.   Follow-Up: At [facility], you and your health needs are our priority.  As part of our continuing mission to provide you with exceptional heart care, we have created designated Provider Care Teams.  These Care Teams include your primary Cardiologist (physician) and Advanced Practice Providers (APPs -  Physician Assistants and Nurse Practitioners) who all work together to provide you with the care you need, when you need it.     Your next appointment:   1 month(s)  The format for your next appointment:   In Person  Provider:   You will see one of the following Advanced Practice Providers on your designated Care Team:    Melina Copa, PA-C  Ermalinda Barrios, PA-C

## 2020-02-12 NOTE — Addendum Note (Signed)
Addended by: Antonieta Iba on: 02/12/2020 03:00 PM   Modules accepted: Orders

## 2020-02-17 ENCOUNTER — Ambulatory Visit (INDEPENDENT_AMBULATORY_CARE_PROVIDER_SITE_OTHER): Payer: Medicare Other

## 2020-02-17 DIAGNOSIS — I5032 Chronic diastolic (congestive) heart failure: Secondary | ICD-10-CM | POA: Diagnosis not present

## 2020-02-17 DIAGNOSIS — I351 Nonrheumatic aortic (valve) insufficiency: Secondary | ICD-10-CM

## 2020-02-17 DIAGNOSIS — I4819 Other persistent atrial fibrillation: Secondary | ICD-10-CM | POA: Diagnosis not present

## 2020-02-17 DIAGNOSIS — I10 Essential (primary) hypertension: Secondary | ICD-10-CM | POA: Diagnosis not present

## 2020-02-17 DIAGNOSIS — I272 Pulmonary hypertension, unspecified: Secondary | ICD-10-CM

## 2020-02-17 DIAGNOSIS — I340 Nonrheumatic mitral (valve) insufficiency: Secondary | ICD-10-CM

## 2020-02-20 NOTE — Telephone Encounter (Signed)
Patient is scheduled for CPAP Titration on 03/24/20. Patient understands she titration study will be done at [facility] sleep lab. Patient understands she will receive a letter in a week or so detailing appointment, date, time, and location. Patient understands to call if she does not receive the letter  in a timely manner. Patient agrees with treatment and thanked me for call.

## 2020-02-21 ENCOUNTER — Other Ambulatory Visit: Payer: Self-pay | Admitting: Physician Assistant

## 2020-02-21 DIAGNOSIS — I5032 Chronic diastolic (congestive) heart failure: Secondary | ICD-10-CM

## 2020-02-22 ENCOUNTER — Ambulatory Visit (INDEPENDENT_AMBULATORY_CARE_PROVIDER_SITE_OTHER): Payer: Medicare Other

## 2020-02-22 ENCOUNTER — Ambulatory Visit
Admission: EM | Admit: 2020-02-22 | Discharge: 2020-02-22 | Disposition: A | Discharge status: Home / Self Care | Payer: Medicare Other | Attending: Emergency Medicine | Admitting: Emergency Medicine

## 2020-02-22 ENCOUNTER — Other Ambulatory Visit: Payer: Self-pay

## 2020-02-22 DIAGNOSIS — R0602 Shortness of breath: Secondary | ICD-10-CM

## 2020-02-22 DIAGNOSIS — R059 Cough, unspecified: Secondary | ICD-10-CM | POA: Diagnosis not present

## 2020-02-22 DIAGNOSIS — J069 Acute upper respiratory infection, unspecified: Secondary | ICD-10-CM | POA: Diagnosis not present

## 2020-02-22 MED ORDER — BENZONATATE 100 MG PO CAPS: 100.0000 mg | ORAL_CAPSULE | Freq: Three times a day (TID) | ORAL | 0 refills | Status: DC

## 2020-02-22 MED ORDER — DOXYCYCLINE HYCLATE 100 MG PO CAPS: 100.0000 mg | ORAL_CAPSULE | Freq: Two times a day (BID) | ORAL | 0 refills | Status: AC

## 2020-02-22 NOTE — Discharge Instructions (Addendum)

## 2020-02-22 NOTE — ED Triage Notes (Signed)
Patient states she has had a cough and congestion since yesterday and both conditions are worsening. Pt has not had any otc meds. Pt states the congestion is more in her chest than nasal and has a productive cough. Pt is aox4 and ambulatory.

## 2020-02-22 NOTE — ED Provider Notes (Addendum)
[facility]    CSN: [id number] Arrival date & time: 02/22/20  1043      History   Chief Complaint Chief Complaint  Patient presents with  . Cough    Since yesterday  . Chest Congestion    HPI Patricia [name] is a 79 y.o. female  With extensive history as below presenting for cough and congestion since yesterday.  Patient feels that her breathing is at baseline, denies lower leg swelling.  No chest pain, palpitations, fever.  Has not taken anything for this.  Past Medical History:  Diagnosis Date  . Allergic rhinitis, cause unspecified   . Aortic regurgitation 06/17/2015   mild by echo 2021  . 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 Chronic atrial fibrillation ([place])    a. Dx 04/2015 at time of stroke. b. DCCV 06/2015 - did not hold. Amio started then stopped due to QT prolongation.  . Chronic cough   . Chronic diastolic heart failure ([place]) 06/17/2015  . CKD (chronic kidney disease), stage III ([place])   . Cough variant asthma   . Disease of pharynx or nasopharynx   . Diverticulosis   . Enlargement of right atrium 01/23/2020  . Essential hypertension   . GERD (gastroesophageal reflux disease)   . Glaucoma   . Hiatal hernia   . Hyperlipidemia   . Internal hemorrhoids   . Laryngospasm   . Mitral regurgitation    mild to moderate by echo 12/2019  . Multinodular goiter   . Osteopenia   . Postmenopausal   . Pulmonary HTN ([place])     Moderate with PASP 54mmHg by echo 2018 likely Group 2 from pulmonary venous HTN from CHF>>normal PAP by echo 12/2019  . Radiation 10/22/14-11/23/14   Left Breast  . Stricture and stenosis of cervix     Patient Active Problem List   Diagnosis Date Noted  . Mitral regurgitation   . Enlargement of right atrium 01/23/2020  . Pulmonary HTN ([place])   . Seizure disorder ([place])- txed by Leonie Man- Neuro 03/06/2019  . Leukocytosis 12/05/2018  . Bacteria in urine 12/05/2018  . Altered mental  status 12/05/2018  . Cerebrovascular accident ([place]) 09/27/2017  . Memory changes 09/27/2017  . Hypocalcemia 05/27/2017  . h/o Radiation 04/14/2017  . High risk medication use 04/14/2017  . Arthritis 01/27/2017  . Prediabetes 01/06/2017  . Vitamin D deficiency 01/06/2017  . Postmenopausal bone loss 01/06/2017  . CKD (chronic kidney disease), stage III ([place]) 10/30/2016  . 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 History of CVA (cerebrovascular accident) 05/14/2016  . Abnormal PFT 10/24/2015  . Persistent atrial fibrillation ([place])   . Chronic diastolic heart failure ([place]) 06/17/2015  . Aortic regurgitation 06/17/2015  . Essential hypertension 06/17/2015  . CVA (cerebral vascular accident) ([place]) 04/30/2015  . CVA (cerebral infarction)   . Pulmonary edema 04/27/2015  . 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 Malignant neoplasm of upper-outer quadrant of left breast in female, estrogen receptor positive ([place]) 06/08/2013  . Multinodular goiter 01/11/2013  . 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 General medical examination 06/09/2010  . Screening for malignant neoplasm of the cervix 06/09/2010  . ESOPHAGEAL STRICTURE 09/26/2009  . GERD 09/26/2009  . HEARING LOSS, UNSPEC. 08/15/2009  . Hyperlipidemia 06/10/2009  . Diverticulosis of large intestine 06/10/2009  . GLAUCOMA, BILATERAL 02/07/2009  . Cough variant asthma 06/27/2007  . Osteopenia 05/28/2007  . STRICTURE AND STENOSIS OF CERVIX  05/06/2007  . POSTMENOPAUSAL STATUS 05/06/2007  . Chronic cough 01/21/2007  . LUNG CANCER, HX OF 07/29/2006    Past Surgical History:  Procedure Laterality Date  . BREAST LUMPECTOMY Left   . BREAST SURGERY    . CARDIOVERSION N/A 06/24/2015   Procedure: CARDIOVERSION;  Surgeon: Sueanne Margarita, MD;  Location: [facility];  Service: Cardiovascular;  Laterality: N/A;  . CARDIOVERSION N/A 05/19/2016   Procedure: CARDIOVERSION;  Surgeon: Sueanne Margarita, MD;  Location: [facility];  Service: Cardiovascular;   Laterality: N/A;  . COLONOSCOPY    . LUNG CANCER SURGERY  2003   resection carcinoid lingula-lt upper lobe  . RADIOACTIVE SEED GUIDED PARTIAL MASTECTOMY WITH AXILLARY SENTINEL LYMPH NODE BIOPSY Left 06/26/2014   Procedure: RADIOACTIVE SEED GUIDED PARTIAL MASTECTOMY WITH AXILLARY SENTINEL LYMPH NODE BIOPSY;  Surgeon: Stark Klein, MD;  Location: [place];  Service: General;  Laterality: Left;  . RE-EXCISION OF BREAST LUMPECTOMY Left 08/07/2014   Procedure: RE-EXCISION OF LEFT BREAST LUMPECTOMY;  Surgeon: Stark Klein, MD;  Location: [place];  Service: General;  Laterality: Left;  . RIGHT HEART CATH N/A 04/27/2016   Procedure: Right Heart Cath;  Surgeon: Larey Dresser, MD;  Location: [facility];  Service: Cardiovascular;  Laterality: N/A;    OB History   No obstetric history on file.      Home Medications    Prior to Admission medications   Medication Sig Start Date End Date Taking? Authorizing Provider  albuterol (PROVENTIL HFA;VENTOLIN HFA) 108 (90 Base) MCG/ACT inhaler Inhale 2 puffs into the lungs every 6 (six) hours as needed for wheezing or shortness of breath.   Yes [provider]  anastrozole (ARIMIDEX) 1 MG tablet TAKE 1 TABLET DAILY 12/21/18  Yes Magrinat, Virgie Dad, MD  Apoaequorin (PREVAGEN) 10 MG CAPS Take 1 capsule by mouth every morning. 03/27/19  Yes Garvin Fila, MD  ARMOUR THYROID 60 MG tablet TAKE 1 TABLET DAILY BEFORE BREAKFAST 12/22/19  Yes Abonza, Maritza, PA-C  atorvastatin (LIPITOR) 40 MG tablet Take 1 tablet (40 mg total) by mouth daily. Please keep upcoming appointment for future refills. Thank you 01/12/20  Yes Turner, Eber Hong, MD  calcium-vitamin D (OSCAL WITH D) 500-200 MG-UNIT tablet Take 1 tablet by mouth daily with breakfast. 11/03/17  Yes Opalski, Neoma Laming, DO  diltiazem (CARDIZEM CD) 300 MG 24 hr capsule Take 1 capsule (300 mg total) by mouth daily. 01/03/20  Yes Turner, Eber Hong, MD  ELIQUIS 5 MG TABS tablet  TAKE 1 TABLET TWICE A DAY 12/25/19  Yes Turner, Eber Hong, MD  fluticasone (FLONASE) 50 MCG/ACT nasal spray 1 spray each nostril following sinus rinses twice daily 02/08/18  Yes Opalski, Deborah, DO  fluticasone-salmeterol (ADVAIR HFA) 230-21 MCG/ACT inhaler USE 2 INHALATIONS TWICE A DAY 08/15/19  Yes Abonza, Maritza, PA-C  gabapentin (NEURONTIN) 400 MG capsule TAKE 1 CAPSULE TWICE A DAY. OFFICE VISIT REQUIRED PRIOR TO ANY FURTHER REFILLS. 01/17/20  Yes Abonza, Herb Grays, PA-C  L-Methylfolate-B12-B6-B2 (CEREFOLIN) 07-31-48-5 MG TABS Take 1 capsule by mouth every morning. 06/22/19  Yes Garvin Fila, MD  levETIRAcetam (KEPPRA XR) 500 MG 24 hr tablet TAKE 1 TABLET DAILY 08/07/19  Yes Garvin Fila, MD  metoprolol succinate (TOPROL XL) 25 MG 24 hr tablet Take 1 tablet (25 mg total) by mouth daily. 02/12/20  Yes Turner, Eber Hong, MD  Multiple Vitamins-Iron (MULTIVITAMIN/IRON) TABS Take 1 tablet by mouth daily.   Yes [provider]  omeprazole (PRILOSEC) 20 MG  capsule TAKE 1 CAPSULE TWICE A DAY BEFORE MEALS 02/21/20  Yes Abonza, Maritza, PA-C  potassium chloride SA (KLOR-CON) 20 MEQ tablet TAKE 1 TABLET TWICE A DAY (KEEP UPCOMING APPOINTMENT IN SEPTEMBER WITH DR TURNER BEFORE ANYMORE REFILLS) 01/03/20  Yes Turner, Eber Hong, MD  Vitamin D, Ergocalciferol, (DRISDOL) 1.25 MG (50000 UNIT) CAPS capsule TAKE 1 CAPSULE EVERY WEDNESDAY AND SUNDAY 04/13/19  Yes Opalski, Neoma Laming, DO  benzonatate (TESSALON) 100 MG capsule Take 1 capsule (100 mg total) by mouth every 8 (eight) hours. 02/22/20   Hall-Potvin, [place], PA-C  doxycycline (VIBRAMYCIN) 100 MG capsule Take 1 capsule (100 mg total) by mouth 2 (two) times daily for 5 days. 02/22/20 02/27/20  Hall-Potvin, [place], PA-C  furosemide (LASIX) 20 MG tablet TAKE 2 TABLETS EVERY MORNING AND 1 TABLET EVERY EVENING 09/05/19   Sueanne Margarita, MD    Family History Family History  Problem Relation Age of Onset  . Stroke Mother   . Hypertension Mother   .  Hyperlipidemia Mother   . Stroke Father   . Transient ischemic attack Father   . Hyperlipidemia Father   . Hypertension Father   . Atrial fibrillation Son   . Heart attack Neg Hx     Social History Social History   Tobacco Use  . Smoking status: Former Smoker    Packs/day: 0.75    Years: 5.00    Pack years: 3.75    Types: Cigarettes    Quit date: 06/15/1966    Years since quitting: 53.7  . Smokeless tobacco: Never Used  Vaping Use  . Vaping Use: Never used  Substance Use Topics  . Alcohol use: Yes    Alcohol/week: 0.0 standard drinks    Comment: occasional wine  . Drug use: No     Allergies   Combigan [brimonidine tartrate-timolol], Other, Sulfa antibiotics, and Sulfamethoxazole-trimethoprim   Review of Systems Review of Systems  Constitutional: Positive for fatigue. Negative for fever.  HENT: Positive for congestion. Negative for dental problem, ear pain, facial swelling, hearing loss, sinus pain, sore throat, trouble swallowing and voice change.   Eyes: Negative for photophobia, pain and visual disturbance.  Respiratory: Positive for cough. Negative for shortness of breath and wheezing.   Cardiovascular: Negative for chest pain and palpitations.  Gastrointestinal: Negative for diarrhea and vomiting.  Musculoskeletal: Negative for arthralgias and myalgias.  Neurological: Negative for dizziness and headaches.     Physical Exam Triage Vital Signs ED Triage Vitals  Enc Vitals Group     BP      Pulse      Resp      Temp      Temp src      SpO2      Weight      Height      Head Circumference      Peak Flow      Pain Score      Pain Loc      Pain Edu?      Excl. in [place]?    No data found.  Updated Vital Signs BP 127/86 (BP Location: Left Arm)   Pulse (!) 114   Temp 98.3 F (36.8 C) (Oral)   Resp 20   SpO2 93%   Visual Acuity Right Eye Distance:   Left Eye Distance:   Bilateral Distance:    Right Eye Near:   Left Eye Near:    Bilateral  Near:     Physical Exam Constitutional:      General: She  is not in acute distress.    Appearance: She is not ill-appearing or diaphoretic.  HENT:     Head: Normocephalic and atraumatic.     Right Ear: Tympanic membrane and ear canal normal.     Left Ear: Tympanic membrane and ear canal normal.     Mouth/Throat:     Mouth: Mucous membranes are moist.     Pharynx: Oropharynx is clear. No oropharyngeal exudate or posterior oropharyngeal erythema.  Eyes:     General: No scleral icterus.    Conjunctiva/sclera: Conjunctivae normal.     Pupils: Pupils are equal, round, and reactive to light.  Neck:     Comments: Trachea midline, negative JVD Cardiovascular:     Rate and Rhythm: Regular rhythm. Tachycardia present.     Heart sounds: No murmur heard. No gallop.   Pulmonary:     Effort: Pulmonary effort is normal. No respiratory distress.     Breath sounds: Rales present. No wheezing or rhonchi.     Comments: Scattered, mild.  No prolonged expiratory phase.  Good air entry bilaterally. Musculoskeletal:     Cervical back: Neck supple. No tenderness.  Lymphadenopathy:     Cervical: No cervical adenopathy.  Skin:    Capillary Refill: Capillary refill takes less than 2 seconds.     Coloration: Skin is not jaundiced or pale.     Findings: No rash.  Neurological:     General: No focal deficit present.     Mental Status: She is alert and oriented to person, place, and time.      UC Treatments / Results  Labs (all labs ordered are listed, but only abnormal results are displayed) Labs Reviewed  NOVEL CORONAVIRUS, NAA    EKG   Radiology DG Chest 2 View  Result Date: 02/22/2020 CLINICAL DATA:  Shortness of breath and cough EXAM: CHEST - 2 VIEW COMPARISON:  December 21, 2019 FINDINGS: Postoperative change noted in the right mid lung. There is no edema or airspace opacity. Heart is upper normal in size with pulmonary vascularity normal. No adenopathy. There is aortic atherosclerosis.  Postoperative change noted in the left hemithorax. Status post left mastectomy. There is degenerative change in the thoracic spine. IMPRESSION: Status post left mastectomy. Surgical clips on the left. No edema or airspace opacity. Heart upper normal in size. No appreciable adenopathy. Aortic Atherosclerosis (ICD10-I70.0). Electronically Signed   By: Lowella Grip III M.D.   On: 02/22/2020 12:06    Procedures Procedures (including critical care time)  Medications Ordered in UC Medications - No data to display  Initial Impression / Assessment and Plan / UC Course  I have reviewed the triage vital signs and the nursing notes.  Pertinent labs & imaging results that were available during my care of the patient were reviewed by me and considered in my medical decision making (see chart for details).     Patient afebrile, nontoxic, with SpO2 93%.  Covid PCR pending.  Patient to quarantine until results are back.  We will treat supportively as outlined below, cover with doxycycline given age, comorbidities.  Return precautions discussed, patient verbalized understanding and is agreeable to plan. Final Clinical Impressions(s) / UC Diagnoses   Final diagnoses:  URI with cough and congestion   Discharge Instructions   None    ED Prescriptions    Medication Sig Dispense Auth. Provider   doxycycline (VIBRAMYCIN) 100 MG capsule Take 1 capsule (100 mg total) by mouth 2 (two) times daily for 5 days. 10 capsule Hall-Potvin,  [place], PA-C   benzonatate (TESSALON) 100 MG capsule Take 1 capsule (100 mg total) by mouth every 8 (eight) hours. 21 capsule Hall-Potvin, [place], PA-C     PDMP not reviewed this encounter.   Hall-Potvin, [place], PA-C 02/22/20 [place], [place], [place] 02/22/20 1211

## 2020-02-24 LAB — NOVEL CORONAVIRUS, NAA: SARS-CoV-2, NAA: NOT DETECTED

## 2020-02-24 LAB — SARS-COV-2, NAA 2 DAY TAT

## 2020-02-29 DIAGNOSIS — I5032 Chronic diastolic (congestive) heart failure: Secondary | ICD-10-CM | POA: Diagnosis not present

## 2020-02-29 DIAGNOSIS — I4819 Other persistent atrial fibrillation: Secondary | ICD-10-CM | POA: Diagnosis not present

## 2020-03-04 ENCOUNTER — Ambulatory Visit (INDEPENDENT_AMBULATORY_CARE_PROVIDER_SITE_OTHER): Payer: Medicare Other

## 2020-03-04 ENCOUNTER — Telehealth: Payer: Self-pay

## 2020-03-04 DIAGNOSIS — I4819 Other persistent atrial fibrillation: Secondary | ICD-10-CM

## 2020-03-04 MED ORDER — METOPROLOL SUCCINATE ER 50 MG PO TB24: 50.0000 mg | ORAL_TABLET | Freq: Every day | ORAL | 3 refills | Status: DC

## 2020-03-04 NOTE — Telephone Encounter (Signed)
-----   Message from Sueanne Margarita, MD sent at 02/29/2020  4:17 PM EST ----- HR still not adequately controlled.  Please increase Toprol XL to 50mg  daily and check BP and HR daily for a week and repeat 3 day ziopatch after being on higher dose of toprol for a week

## 2020-03-04 NOTE — Telephone Encounter (Signed)
The patient has been notified of the result and verbalized understanding.  All questions (if any) were answered. Antonieta Iba, RN 03/04/2020 11:29 AM  Patient will increase Toprol XL to 50 mg daily.  Order placed for 3 day zio patch.

## 2020-03-08 DIAGNOSIS — I4819 Other persistent atrial fibrillation: Secondary | ICD-10-CM | POA: Diagnosis not present

## 2020-03-13 NOTE — Progress Notes (Signed)
Virtual Visit via Video Note   This visit type was conducted due to national recommendations for restrictions regarding the COVID-19 Pandemic (e.g. social distancing) in an effort to limit this patient's exposure and mitigate transmission in our community.  Due to her co-morbid illnesses, this patient is at least at moderate risk for complications without adequate follow up.  This format is felt to be most appropriate for this patient at this time.  All issues noted in this document were discussed and addressed.  A limited physical exam was performed with this format.  Please refer to the patient's chart for her consent to telehealth for [facility].       Date:  03/19/2020   ID:  Patricia [name], DOB 09/27/1940, MRN [id number] The patient was identified using 2 identifiers.  Patient Location: Home Provider Location: Home Office  PCP:  Lorrene Reid, PA-C  Cardiologist:  Fransico Him, MD   Electrophysiologist:  Constance Haw, MD   Evaluation Performed:  Follow-Up Visit  Chief Complaint:  Follow up  History of Present Illness:    Patricia [name] is a 80 y.o. female with with history of chronic AFib on Eliquis (amiodarone stopped because of prolonged QT), CVA, chronic diastolic CHF, mild  AI, normal NST 2017, lung CA resection 2003, breast CA s/p lumpectomy/Tamoxifen/XRT, HTN, HLD.  2D echo in 2018 showed moderate PHTN with PASP 53mmHg and RHC 04/2016 with borderline pulmonary HTN.  Repeat 2D echo 12/2019 showed normal LVF with mild to moderate MR and normal PASP.     Patient last saw Dr. Radford Pax 02/12/2020 and was doing well. Heart rate was borderline controlled. Cardizem had been increased after an heart monitor showed elevated heart rates. She added Toprol-XL 25 mg daily and repeated a 24-hour Zio patch to assess heart rate control.Heart rate still elevated so Toprol LX increased to 50 mg daily.  Patient says she feels fine. HR this am 90's. Just mailed monitor back last week. No  results yet. Walks when she plays golf but unable to play with the weather. Asymptomatic with fast HR's. Has a fitbit but not wearing it. Denies chest pain, palpitations, dyspnea, dizziness or edema.    The patient does not have symptoms concerning for COVID-19 infection (fever, chills, cough, or new shortness of breath).    Past Medical History:  Diagnosis Date  . Allergic rhinitis, cause unspecified   . Aortic regurgitation 06/17/2015   mild by echo 2021  . 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 Chronic atrial fibrillation ([place])    a. Dx 04/2015 at time of stroke. b. DCCV 06/2015 - did not hold. Amio started then stopped due to QT prolongation.  . Chronic cough   . Chronic diastolic heart failure ([place]) 06/17/2015  . CKD (chronic kidney disease), stage III ([place])   . Cough variant asthma   . Disease of pharynx or nasopharynx   . Diverticulosis   . Enlargement of right atrium 01/23/2020  . Essential hypertension   . GERD (gastroesophageal reflux disease)   . Glaucoma   . Hiatal hernia   . Hyperlipidemia   . Internal hemorrhoids   . Laryngospasm   . Mitral regurgitation    mild to moderate by echo 12/2019  . Multinodular goiter   . Osteopenia   . Postmenopausal   . Pulmonary HTN ([place])     Moderate with PASP 56mmHg by echo 2018 likely Group 2  from pulmonary venous HTN from CHF>>normal PAP by echo 12/2019  . Radiation 10/22/14-11/23/14   Left Breast  . Stricture and stenosis of cervix    Past Surgical History:  Procedure Laterality Date  . BREAST LUMPECTOMY Left   . BREAST SURGERY    . CARDIOVERSION N/A 06/24/2015   Procedure: CARDIOVERSION;  Surgeon: Sueanne Margarita, MD;  Location: [place];  Service: Cardiovascular;  Laterality: N/A;  . CARDIOVERSION N/A 05/19/2016   Procedure: CARDIOVERSION;  Surgeon: Sueanne Margarita, MD;  [facility]: [facility];  Service: Cardiovascular;  Laterality: N/A;  . COLONOSCOPY    . LUNG CANCER  SURGERY  2003   resection carcinoid lingula-lt upper lobe  . RADIOACTIVE SEED GUIDED PARTIAL MASTECTOMY WITH AXILLARY SENTINEL LYMPH NODE BIOPSY Left 06/26/2014   Procedure: RADIOACTIVE SEED GUIDED PARTIAL MASTECTOMY WITH AXILLARY SENTINEL LYMPH NODE BIOPSY;  Surgeon: Stark Klein, MD;  Location: [place];  Service: General;  Laterality: Left;  . RE-EXCISION OF BREAST LUMPECTOMY Left 08/07/2014   Procedure: RE-EXCISION OF LEFT BREAST LUMPECTOMY;  Surgeon: Stark Klein, MD;  Location: [place];  Service: General;  Laterality: Left;  . RIGHT HEART CATH N/A 04/27/2016   Procedure: Right Heart Cath;  Surgeon: Larey Dresser, MD;  Location: [place] [facility];  Service: Cardiovascular;  Laterality: N/A;     Current Meds  Medication Sig  . albuterol (PROVENTIL HFA;VENTOLIN HFA) 108 (90 Base) MCG/ACT inhaler Inhale 2 puffs into the lungs every 6 (six) hours as needed for wheezing or shortness of breath.  . anastrozole (ARIMIDEX) 1 MG tablet TAKE 1 TABLET DAILY  . Apoaequorin (PREVAGEN) 10 MG CAPS Take 1 capsule by mouth every morning.  Francia Greaves THYROID 60 MG tablet TAKE 1 TABLET DAILY BEFORE BREAKFAST  . atorvastatin (LIPITOR) 40 MG tablet Take 1 tablet (40 mg total) by mouth daily. Please keep upcoming appointment for future refills. Thank you  . calcium-vitamin D (OSCAL WITH D) 500-200 MG-UNIT tablet Take 1 tablet by mouth daily with breakfast.  . diltiazem (CARDIZEM CD) 300 MG 24 hr capsule Take 1 capsule (300 mg total) by mouth daily.  Marland Kitchen ELIQUIS 5 MG TABS tablet TAKE 1 TABLET TWICE A DAY  . fluticasone (FLONASE) 50 MCG/ACT nasal spray 1 spray each nostril following sinus rinses twice daily  . fluticasone-salmeterol (ADVAIR HFA) 230-21 MCG/ACT inhaler USE 2 INHALATIONS TWICE A DAY  . furosemide (LASIX) 20 MG tablet TAKE 2 TABLETS EVERY MORNING AND 1 TABLET EVERY EVENING  . gabapentin (NEURONTIN) 400 MG capsule TAKE 1 CAPSULE TWICE A DAY. OFFICE VISIT REQUIRED PRIOR  TO ANY FURTHER REFILLS.  Marland Kitchen L-Methylfolate-B12-B6-B2 (CEREFOLIN) 07-31-48-5 MG TABS Take 1 capsule by mouth every morning.  . levETIRAcetam (KEPPRA XR) 500 MG 24 hr tablet TAKE 1 TABLET DAILY  . metoprolol succinate (TOPROL-XL) 50 MG 24 hr tablet Take 1 tablet (50 mg total) by mouth daily. Take with or immediately following a meal.  . Multiple Vitamins-Iron (MULTIVITAMIN/IRON) TABS Take 1 tablet by mouth daily.  Marland Kitchen omeprazole (PRILOSEC) 20 MG capsule TAKE 1 CAPSULE TWICE A DAY BEFORE MEALS  . potassium chloride SA (KLOR-CON) 20 MEQ tablet TAKE 1 TABLET TWICE A DAY (KEEP UPCOMING APPOINTMENT IN SEPTEMBER WITH DR TURNER BEFORE ANYMORE REFILLS)  . Vitamin D, Ergocalciferol, (DRISDOL) 1.25 MG (50000 UNIT) CAPS capsule TAKE 1 CAPSULE EVERY WEDNESDAY AND SUNDAY     Allergies:   Combigan [brimonidine tartrate-timolol], Other, Sulfa antibiotics, and Sulfamethoxazole-trimethoprim   Social History   Tobacco Use  . Smoking  status: Former Smoker    Packs/day: 0.75    Years: 5.00    Pack years: 3.75    Types: Cigarettes    Quit date: 06/15/1966    Years since quitting: 53.7  . Smokeless tobacco: Never Used  Vaping Use  . Vaping Use: Never used  Substance Use Topics  . Alcohol use: Yes    Alcohol/week: 0.0 standard drinks    Comment: occasional wine  . Drug use: No     Family Hx: The patient's family history includes Atrial fibrillation in her son; Hyperlipidemia in her father and mother; Hypertension in her father and mother; Stroke in her father and mother; Transient ischemic attack in her father. There is no history of Heart attack.  ROS:   Please see the history of present illness.      All other systems reviewed and are negative.   Prior CV studies:   The following studies were reviewed today: 2D echo 12/2019 IMPRESSIONS    1. Left ventricular ejection fraction, by estimation, is 55 to 60%. The  left ventricle has normal function. The left ventricle has no regional  wall motion  abnormalities. There is mild left ventricular hypertrophy.  Left ventricular diastolic parameters  are indeterminate.   2. Right ventricular systolic function is mildly reduced. The right  ventricular size is normal. There is mildly elevated pulmonary artery  systolic pressure.   3. Left atrial size was severely dilated.   4. Right atrial size was severely dilated.   5. The mitral valve is normal in structure. Mild to moderate mitral valve  regurgitation. ERO 0.19 cm^2, RV 23 cc. Moderate mitral annular  calcification.   6. Tricuspid valve regurgitation is moderate.   7. The aortic valve is tricuspid. Aortic valve regurgitation is mild.  Mild to moderate aortic valve sclerosis/calcification is present, without  any evidence of aortic stenosis.      Labs/Other Tests and Data Reviewed:    EKG:  No ECG reviewed.  Recent Labs: 09/28/2019: ALT 36; BUN 18; Creatinine, Ser 1.02; Hemoglobin 14.5; Platelets 234; Potassium 4.2; Sodium 141; TSH 2.940   Recent Lipid Panel Lab Results  Component Value Date/Time   CHOL 139 09/28/2019 08:47 AM   TRIG 86 09/28/2019 08:47 AM   TRIG 52 01/13/2006 10:12 AM   HDL 47 09/28/2019 08:47 AM   CHOLHDL 3.0 09/28/2019 08:47 AM   CHOLHDL 3 04/01/2016 09:52 AM   LDLCALC 75 09/28/2019 08:47 AM   LDLDIRECT 174.3 06/04/2009 12:00 AM    Wt Readings from Last 3 Encounters:  03/19/20 134 lb (60.8 kg)  02/12/20 134 lb 3.2 oz (60.9 kg)  01/23/20 134 lb (60.8 kg)     Risk Assessment/Calculations:     CHA2DS2-VASc Score = 7  This indicates a 11.2% annual risk of stroke. The patient's score is based upon: CHF History: Yes HTN History: Yes Diabetes History: No Stroke History: Yes Vascular Disease History: No Age Score: 2 Gender Score: 1     Objective:    Vital Signs:  BP 117/86   Pulse 98   Ht 5' (1.524 m)   Wt 134 lb (60.8 kg)   BMI 26.17 kg/m    VITAL SIGNS:  reviewed GEN:  no acute distress RESPIRATORY:  normal respiratory effort,  symmetric expansion CARDIOVASCULAR:  no peripheral edema  ASSESSMENT & PLAN:    Persistent atrial fibrillation CHA2DS2-VASc equals 7 on Eliquis did not tolerate amiodarone because of prolonged QT. Recently elevated heart rate so her Cardizem increased and Toprol-XL  25 mg added. 24-hour ZIO: HR's still elevated so Toprol XL increased 50 mg daily. Another monitor ordered and has been received but not processed. HR 90's this am but just took her meds. Will wait for monitor results to come back before making any further adjustments. She is asymptomatic  Chronic diastolic CHF-no edema, compensated  Hypertension BP well controlled  Mild to moderate MR on echo 12/2019 plan to repeat 11/2020         COVID-19 Education: The signs and symptoms of COVID-19 were discussed with the patient and how to seek care for testing (follow up with PCP or arrange E-visit).  The importance of social distancing was discussed today.  Time:   Today, I have spent 13:13 minutes with the patient with telehealth technology discussing the above problems.     Medication Adjustments/Labs and Tests Ordered: Current medicines are reviewed at length with the patient today.  Concerns regarding medicines are outlined above.   Tests Ordered: No orders of the defined types were placed in this encounter.   Medication Changes: No orders of the defined types were placed in this encounter.   Follow Up:  In Person in 3 month(s)  Signed, Ermalinda Barrios, PA-C  03/19/2020 8:43 AM    [facility]

## 2020-03-14 ENCOUNTER — Ambulatory Visit: Payer: Medicare Other | Admitting: Physician Assistant

## 2020-03-19 ENCOUNTER — Other Ambulatory Visit: Payer: Self-pay

## 2020-03-19 ENCOUNTER — Encounter: Payer: Self-pay | Admitting: Physician Assistant

## 2020-03-19 ENCOUNTER — Telehealth: Payer: Self-pay

## 2020-03-19 ENCOUNTER — Telehealth (INDEPENDENT_AMBULATORY_CARE_PROVIDER_SITE_OTHER): Payer: Medicare Other | Admitting: Physician Assistant

## 2020-03-19 VITALS — BP 117/86 | HR 98 | Ht 60.0 in | Wt 134.0 lb

## 2020-03-19 DIAGNOSIS — I340 Nonrheumatic mitral (valve) insufficiency: Secondary | ICD-10-CM | POA: Diagnosis not present

## 2020-03-19 DIAGNOSIS — I10 Essential (primary) hypertension: Secondary | ICD-10-CM

## 2020-03-19 DIAGNOSIS — I5032 Chronic diastolic (congestive) heart failure: Secondary | ICD-10-CM | POA: Diagnosis not present

## 2020-03-19 DIAGNOSIS — I4819 Other persistent atrial fibrillation: Secondary | ICD-10-CM

## 2020-03-19 NOTE — Telephone Encounter (Signed)
  Patient Consent for Virtual Visit         Patricia [name] has provided verbal consent on 03/19/2020 for a virtual visit (video or telephone).   CONSENT FOR VIRTUAL VISIT FOR:  Patricia [name]  By participating in this virtual visit I agree to the following:  I hereby voluntarily request, consent and authorize [facility] and its employed or contracted physicians, physician assistants, nurse practitioners or other licensed health care professionals (the Practitioner), to provide me with telemedicine health care services (the "Services") as deemed necessary by the treating Practitioner. I acknowledge and consent to receive the Services by the Practitioner via telemedicine. I understand that the telemedicine visit will involve communicating with the Practitioner through live audiovisual communication technology and the disclosure of certain medical information by electronic transmission. I acknowledge that I have been given the opportunity to request an in-person assessment or other available alternative prior to the telemedicine visit and am voluntarily participating in the telemedicine visit.  I understand that I have the right to withhold or withdraw my consent to the use of telemedicine in the course of my care at any time, without affecting my right to future care or treatment, and that the Practitioner or I may terminate the telemedicine visit at any time. I understand that I have the right to inspect all information obtained and/or recorded in the course of the telemedicine visit and may receive copies of available information for a reasonable fee.  I understand that some of the potential risks of receiving the Services via telemedicine include:  Marland Kitchen Delay or interruption in medical evaluation due to technological equipment failure or disruption; . Information transmitted may not be sufficient (e.g. poor resolution of images) to allow for appropriate medical decision making by the Practitioner; and/or   . In rare instances, security protocols could fail, causing a breach of personal health information.  Furthermore, I acknowledge that it is my responsibility to provide information about my medical history, conditions and care that is complete and accurate to the best of my ability. I acknowledge that Practitioner's advice, recommendations, and/or decision may be based on factors not within their control, such as incomplete or inaccurate data provided by me or distortions of diagnostic images or specimens that may result from electronic transmissions. I understand that the practice of medicine is not an exact science and that Practitioner makes no warranties or guarantees regarding treatment outcomes. I acknowledge that a copy of this consent can be made available to me via my patient portal ([facility]), or I can request a printed copy by calling the office of [facility].    I understand that my insurance will be billed for this visit.   I have read or had this consent read to me. . I understand the contents of this consent, which adequately explains the benefits and risks of the Services being provided via telemedicine.  . I have been provided ample opportunity to ask questions regarding this consent and the Services and have had my questions answered to my satisfaction. . I give my informed consent for the services to be provided through the use of telemedicine in my medical care

## 2020-03-19 NOTE — Patient Instructions (Signed)
Medication Instructions:  Your physician recommends that you continue on your current medications as directed. Please refer to the Current Medication list given to you today.  *If you need a refill on your cardiac medications before your next appointment, please call your pharmacy*   Lab Work: None ordered today If you have labs (blood work) drawn today and your tests are completely normal, you will receive your results only by: Marland Kitchen MyChart Message (if you have MyChart) OR . A paper copy in the mail If you have any lab test that is abnormal or we need to change your treatment, we will call you to review the results.   Follow-Up: At [facility], you and your health needs are our priority.  As part of our continuing mission to provide you with exceptional heart care, we have created designated Provider Care Teams.  These Care Teams include your primary Cardiologist (physician) and Advanced Practice Providers (APPs -  Physician Assistants and Nurse Practitioners) who all work together to provide you with the care you need, when you need it.  Your next appointment:   Patricia [name] 28, 2022 @ 2:20  The format for your next appointment:   In Person  Provider:   Fransico Him, MD

## 2020-03-24 ENCOUNTER — Ambulatory Visit (HOSPITAL_BASED_OUTPATIENT_CLINIC_OR_DEPARTMENT_OTHER): Payer: Medicare Other | Attending: Cardiology | Admitting: Cardiology

## 2020-03-24 ENCOUNTER — Other Ambulatory Visit: Payer: Self-pay

## 2020-03-24 DIAGNOSIS — G4733 Obstructive sleep apnea (adult) (pediatric): Secondary | ICD-10-CM | POA: Diagnosis not present

## 2020-03-24 DIAGNOSIS — I4891 Unspecified atrial fibrillation: Secondary | ICD-10-CM | POA: Insufficient documentation

## 2020-03-25 ENCOUNTER — Telehealth: Payer: Self-pay | Admitting: Cardiology

## 2020-03-25 DIAGNOSIS — I4819 Other persistent atrial fibrillation: Secondary | ICD-10-CM

## 2020-03-25 MED ORDER — DILTIAZEM HCL ER COATED BEADS 300 MG PO CP24
300.0000 mg | ORAL_CAPSULE | Freq: Every day | ORAL | 3 refills | Status: DC
Start: 2020-03-25 — End: 2020-12-31

## 2020-03-25 NOTE — Telephone Encounter (Signed)
Pt's medication was sent to pt's pharmacy as requested. Confirmation received.  °

## 2020-03-25 NOTE — Telephone Encounter (Signed)
*  STAT* If patient is at the pharmacy, call can be transferred to refill team.   1. Which medications need to be refilled? (please list name of each medication and dose if known) diltiazem (CARDIZEM CD) 300 MG 24 hr capsule  2. Which pharmacy/location (including street and city if local pharmacy) is medication to be sent to? [facility], [place]  3. Do they need a 30 day or 90 day supply? 90 day supply

## 2020-03-27 ENCOUNTER — Other Ambulatory Visit: Payer: Self-pay | Admitting: Cardiology

## 2020-04-05 ENCOUNTER — Telehealth: Payer: Self-pay | Admitting: Physician Assistant

## 2020-04-05 NOTE — Telephone Encounter (Signed)
Vaccine record has been updated. AS, CMA

## 2020-04-08 DIAGNOSIS — H401133 Primary open-angle glaucoma, bilateral, severe stage: Secondary | ICD-10-CM | POA: Diagnosis not present

## 2020-04-14 NOTE — Procedures (Signed)
   Patient Name: Patricia [name], Binz Date: 03/24/2020 Gender: Female D.O.B: [birth year] Age (years): 69 Referring Provider: Fransico Him MD, ABSM Height (inches): 60 Interpreting Physician: Fransico Him MD, ABSM Weight (lbs): 134 RPSGT: Earney [place] BMI: 26 MRN: [id number] Neck Size: 13.50  CLINICAL INFORMATION The patient is referred for a BiPAP titration to treat sleep apnea.  SLEEP STUDY TECHNIQUE As per the AASM Manual for the Scoring of Sleep and Associated Events v2.3 (April 2016) with a hypopnea requiring 4% desaturations.  The channels recorded and monitored were frontal, central and occipital EEG, electrooculogram (EOG), submentalis EMG (chin), nasal and oral airflow, thoracic and abdominal wall motion, anterior tibialis EMG, snore microphone, electrocardiogram, and pulse oximetry. Bilevel positive airway pressure (BPAP) was initiated at the beginning of the study and titrated to treat sleep-disordered breathing.  MEDICATIONS Medications self-administered by patient taken the night of the study : N/A  RESPIRATORY PARAMETERS Optimal IPAP Pressure (cm): 17  AHI at Optimal Pressure (/hr) N/A Optimal EPAP Pressure (cm):11  Overall Minimal O2 (%):89.0  Minimal O2 at Optimal Pressure (%):89.0  SLEEP ARCHITECTURE Start Time:9:50:26 PM  Stop Time:4:32:30 AM  Total Time (min):402.1  Total Sleep Time (min):283.5 Sleep Latency (min):14.9  Sleep Efficiency (%):70.5%  REM Latency (min):118.5  WASO (min):103.6 Stage N1 (%):2.5%  Stage N2 (%):77.4%  Stage N3 (%):2.3%  Stage R (%):17.8 Supine (%):40.06  Arousal Index (/hr):10.8   CARDIAC DATA The 2 lead EKG demonstrated Atrial Fibrillation The mean heart rate was 90.3 beats per minute.   LEG MOVEMENT DATA The total Periodic Limb Movements of Sleep (PLMS) were 0. The PLMS index was 0.0. A PLMS index of <15 is considered normal in adults.  IMPRESSIONS - An optimal PAP pressure was selected for this patient ( 17 /  cm of water) - Central sleep apnea was not noted during this titration (CAI = 4.0/h). - Mild oxygen desaturations were observed during this titration (min O2 = 89.0%). - The patient snored with soft snoring volume. - Atrial Fibrillation was observed during this study. - Clinically significant periodic limb movements were not noted during this study. Arousals associated with PLMs were significant.  DIAGNOSIS - Obstructive Sleep Apnea (G47.33) - Atrial Fibrillation  RECOMMENDATIONS - Trial of BiPAP therapy on 17/11 cm H2O with a Small size Fisher&Paykel Full Face Mask Simplus mask and heated humidification. - Avoid alcohol, sedatives and other CNS depressants that may worsen sleep apnea and disrupt normal sleep architecture. - Sleep hygiene should be reviewed to assess factors that may improve sleep quality. - Weight management and regular exercise should be initiated or continued. - Return to Sleep Center for re-evaluation after 6 weeks of therapy  [Electronically signed] 04/14/2020 10:18 AM  Fransico Him MD, ABSM Diplomate, American Board of Sleep Medicine

## 2020-04-16 ENCOUNTER — Encounter (HOSPITAL_COMMUNITY): Payer: Self-pay | Admitting: Nurse Practitioner

## 2020-04-16 ENCOUNTER — Other Ambulatory Visit: Payer: Self-pay

## 2020-04-16 ENCOUNTER — Ambulatory Visit (HOSPITAL_COMMUNITY)
Admission: RE | Admit: 2020-04-16 | Discharge: 2020-04-16 | Disposition: A | Discharge status: Home / Self Care | Payer: Medicare Other | Source: Ambulatory Visit | Attending: Nurse Practitioner | Admitting: Nurse Practitioner

## 2020-04-16 VITALS — BP 128/74 | HR 89 | Ht 60.0 in | Wt 140.2 lb

## 2020-04-16 DIAGNOSIS — I4819 Other persistent atrial fibrillation: Secondary | ICD-10-CM

## 2020-04-16 DIAGNOSIS — I4821 Permanent atrial fibrillation: Secondary | ICD-10-CM | POA: Diagnosis not present

## 2020-04-16 DIAGNOSIS — Z79899 Other long term (current) drug therapy: Secondary | ICD-10-CM | POA: Insufficient documentation

## 2020-04-16 DIAGNOSIS — Z888 Allergy status to other drugs, medicaments and biological substances status: Secondary | ICD-10-CM | POA: Diagnosis not present

## 2020-04-16 DIAGNOSIS — Z882 Allergy status to sulfonamides status: Secondary | ICD-10-CM | POA: Diagnosis not present

## 2020-04-16 DIAGNOSIS — Z87891 Personal history of nicotine dependence: Secondary | ICD-10-CM | POA: Diagnosis not present

## 2020-04-16 DIAGNOSIS — I10 Essential (primary) hypertension: Secondary | ICD-10-CM | POA: Insufficient documentation

## 2020-04-16 DIAGNOSIS — D6869 Other thrombophilia: Secondary | ICD-10-CM

## 2020-04-16 DIAGNOSIS — Z7901 Long term (current) use of anticoagulants: Secondary | ICD-10-CM | POA: Insufficient documentation

## 2020-04-16 DIAGNOSIS — Z8249 Family history of ischemic heart disease and other diseases of the circulatory system: Secondary | ICD-10-CM | POA: Diagnosis not present

## 2020-04-16 MED ORDER — METOPROLOL SUCCINATE ER 50 MG PO TB24
50.0000 mg | ORAL_TABLET | Freq: Every day | ORAL | 3 refills | Status: DC
Start: 2020-04-16 — End: 2020-07-10

## 2020-04-16 NOTE — Addendum Note (Signed)
Addended by: Antonieta Iba on: 04/16/2020 11:17 AM   Modules accepted: Orders

## 2020-04-16 NOTE — Addendum Note (Signed)
Encounter addended by: Enid Derry, CMA on: 04/16/2020 3:39 PM  Actions taken: Order Reconciliation Section accessed

## 2020-04-16 NOTE — Progress Notes (Signed)
Primary Care Physician: Lorrene Reid, PA-C Referring Physician: Dr. Jim Like Winbush is a 80 y.o. female with a h/o permanent afib since 2018 that was previously on amiodarone and this was stopped in 2018 for prolonged qt and was determined then other qt prolonging meds (sotalol/tikosyn) would have the same outcome. She was not a good ablation candidate for enlarged left atrium. Rate control was her treatment option.   A monitor was placed recently by Dr. Radford Pax and her rate control had an average of 104. Her toprol was increased to 50 mg daily and a repeat monitor was placed then  showed an average of 91. She was referred here for f/u. Pt states that she feels well. She does everything that she wishes to do , she is not bothered by the afib. Her HR in the clinic is 89 bpm. She continues on eliquis 5 mg bid for a CHA2DS2VASc score of 7.   Today, she denies symptoms of palpitations, chest pain, shortness of breath, orthopnea, PND, lower extremity edema, dizziness, presyncope, syncope, or neurologic sequela. The patient is tolerating medications without difficulties and is otherwise without complaint today.   Past Medical History:  Diagnosis Date  . Allergic rhinitis, cause unspecified   . Aortic regurgitation 06/17/2015   mild by echo 2021  . 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 Chronic atrial fibrillation ([place])    a. Dx 04/2015 at time of stroke. b. DCCV 06/2015 - did not hold. Amio started then stopped due to QT prolongation.  . Chronic cough   . Chronic diastolic heart failure ([place]) 06/17/2015  . CKD (chronic kidney disease), stage III ([place])   . Cough variant asthma   . Disease of pharynx or nasopharynx   . Diverticulosis   . Enlargement of right atrium 01/23/2020  . Essential hypertension   . GERD (gastroesophageal reflux disease)   . Glaucoma   . Hiatal hernia   . Hyperlipidemia   . Internal hemorrhoids   .  Laryngospasm   . Mitral regurgitation    mild to moderate by echo 12/2019  . Multinodular goiter   . Osteopenia   . Postmenopausal   . Pulmonary HTN ([place])     Moderate with PASP 76mmHg by echo 2018 likely Group 2 from pulmonary venous HTN from CHF>>normal PAP by echo 12/2019  . Radiation 10/22/14-11/23/14   Left Breast  . Stricture and stenosis of cervix    Past Surgical History:  Procedure Laterality Date  . BREAST LUMPECTOMY Left   . BREAST SURGERY    . CARDIOVERSION N/A 06/24/2015   Procedure: CARDIOVERSION;  Surgeon: Sueanne Margarita, MD;  Location: [place];  Service: Cardiovascular;  Laterality: N/A;  . CARDIOVERSION N/A 05/19/2016   Procedure: CARDIOVERSION;  Surgeon: Sueanne Margarita, MD;  Location[facility];  Service: Cardiovascular;  Laterality: N/A;  . COLONOSCOPY    . LUNG CANCER SURGERY  2003   resection carcinoid lingula-lt upper lobe  . RADIOACTIVE SEED GUIDED PARTIAL MASTECTOMY WITH AXILLARY SENTINEL LYMPH NODE BIOPSY Left 06/26/2014   Procedure: RADIOACTIVE SEED GUIDED PARTIAL MASTECTOMY WITH AXILLARY SENTINEL LYMPH NODE BIOPSY;  Surgeon: Stark Klein, MD;  Location: [place];  Service: General;  Laterality: Left;  . RE-EXCISION OF BREAST LUMPECTOMY Left 08/07/2014   Procedure: RE-EXCISION OF LEFT BREAST LUMPECTOMY;  Surgeon: Stark Klein, MD;  Location: [place];  Service: General;  Laterality: Left;  . RIGHT HEART CATH N/A 04/27/2016   Procedure: Right Heart Cath;  Surgeon: Larey Dresser, MD;  Location: [place] [facility];  Service: Cardiovascular;  Laterality: N/A;    Current Outpatient Medications  Medication Sig Dispense Refill  . albuterol (PROVENTIL HFA;VENTOLIN HFA) 108 (90 Base) MCG/ACT inhaler Inhale 2 puffs into the lungs every 6 (six) hours as needed for wheezing or shortness of breath.    . Apoaequorin (PREVAGEN) 10 MG CAPS Take 1 capsule by mouth every morning. 1 capsule 1  . ARMOUR THYROID 60 MG tablet TAKE 1 TABLET  DAILY BEFORE BREAKFAST 90 tablet 1  . atorvastatin (LIPITOR) 40 MG tablet Take 1 tablet (40 mg total) by mouth daily. 90 tablet 1  . calcium-vitamin D (OSCAL WITH D) 500-200 MG-UNIT tablet Take 1 tablet by mouth daily with breakfast. 30 tablet 0  . diltiazem (CARDIZEM CD) 300 MG 24 hr capsule Take 1 capsule (300 mg total) by mouth daily. 90 capsule 3  . ELIQUIS 5 MG TABS tablet TAKE 1 TABLET TWICE A DAY 180 tablet 1  . fluticasone (FLONASE) 50 MCG/ACT nasal spray 1 spray each nostril following sinus rinses twice daily 16 g 2  . fluticasone-salmeterol (ADVAIR HFA) 230-21 MCG/ACT inhaler USE 2 INHALATIONS TWICE A DAY 36 g 4  . furosemide (LASIX) 20 MG tablet TAKE 2 TABLETS EVERY MORNING AND 1 TABLET EVERY EVENING 270 tablet 3  . gabapentin (NEURONTIN) 400 MG capsule TAKE 1 CAPSULE TWICE A DAY. OFFICE VISIT REQUIRED PRIOR TO ANY FURTHER REFILLS. 180 capsule 0  . L-Methylfolate-B12-B6-B2 (CEREFOLIN) 07-31-48-5 MG TABS Take 1 capsule by mouth every morning. 90 tablet 3  . levETIRAcetam (KEPPRA XR) 500 MG 24 hr tablet TAKE 1 TABLET DAILY 90 tablet 3  . metoprolol succinate (TOPROL-XL) 50 MG 24 hr tablet Take 1 tablet (50 mg total) by mouth daily. Take with or immediately following a meal. 90 tablet 3  . Multiple Vitamins-Iron (MULTIVITAMIN/IRON) TABS Take 1 tablet by mouth daily.    Marland Kitchen omeprazole (PRILOSEC) 20 MG capsule TAKE 1 CAPSULE TWICE A DAY BEFORE MEALS 180 capsule 3  . potassium chloride SA (KLOR-CON) 20 MEQ tablet TAKE 1 TABLET TWICE A DAY (KEEP UPCOMING APPOINTMENT IN SEPTEMBER WITH DR TURNER BEFORE ANYMORE REFILLS) 180 tablet 2  . Vitamin D, Ergocalciferol, (DRISDOL) 1.25 MG (50000 UNIT) CAPS capsule TAKE 1 CAPSULE EVERY WEDNESDAY AND SUNDAY 24 capsule 3   No current facility-administered medications for this encounter.   Facility-Administered Medications Ordered in Other Encounters  Medication Dose Route Frequency Provider Last Rate Last Admin  . denosumab (PROLIA) injection 60 mg  60 mg  Subcutaneous Once Magrinat, Virgie Dad, MD        Allergies  Allergen Reactions  . Combigan [Brimonidine Tartrate-Timolol] Itching    Eyes itch, reddened  . Other Other (See Comments)    Per patient made OU red, Sore, and sensitivity to light  . Sulfa Antibiotics Rash  . Sulfamethoxazole-Trimethoprim Hives    Social History   Socioeconomic History  . Marital status: Married    Spouse name: Not on file  . Number of children: Not on file  . Years of education: Not on file  . Highest education level: Not on file  Occupational History  . Not on file  Tobacco Use  . Smoking status: Former Smoker    Packs/day: 0.75    Years: 5.00    Pack years: 3.75    Types: Cigarettes    Quit date: 06/15/1966  Years since quitting: 53.8  . Smokeless tobacco: Never Used  Vaping Use  . Vaping Use: Never used  Substance and Sexual Activity  . Alcohol use: Yes    Alcohol/week: 0.0 standard drinks    Comment: occasional wine  . Drug use: No  . Sexual activity: Not Currently  Other Topics Concern  . Not on file  Social History Narrative   Retired. Lives with husband.    Right Handed   Drinks 1-2 cups caffeine daily   Social Determinants of Health   Financial Resource Strain: Not on file  Food Insecurity: Not on file  Transportation Needs: Not on file  Physical Activity: Not on file  Stress: Not on file  Social Connections: Not on file  Intimate Partner Violence: Not on file    Family History  Problem Relation Age of Onset  . Stroke Mother   . Hypertension Mother   . Hyperlipidemia Mother   . Stroke Father   . Transient ischemic attack Father   . Hyperlipidemia Father   . Hypertension Father   . Atrial fibrillation Son   . Heart attack Neg Hx     ROS- All systems are reviewed and negative except as per the HPI above  Physical Exam: Vitals:   04/16/20 0826  BP: 128/74  Pulse: 89  Weight: 63.6 kg  Height: 5' (1.524 m)   Wt Readings from Last 3 Encounters:  04/16/20  63.6 kg  03/24/20 59.9 kg  03/19/20 60.8 kg    Labs: Lab Results  Component Value Date   NA 141 09/28/2019   K 4.2 09/28/2019   CL 104 09/28/2019   CO2 26 09/28/2019   GLUCOSE 109 (H) 09/28/2019   BUN 18 09/28/2019   CREATININE 1.02 (H) 09/28/2019   CALCIUM 9.1 09/28/2019   MG 2.4 (H) 11/05/2017   Lab Results  Component Value Date   INR 1.7 (H) 11/30/2018   Lab Results  Component Value Date   CHOL 139 09/28/2019   HDL 47 09/28/2019   LDLCALC 75 09/28/2019   TRIG 86 09/28/2019     GEN- The patient is well appearing, alert and oriented x 3 today.   Head- normocephalic, atraumatic Eyes-  Sclera clear, conjunctiva pink Ears- hearing intact Oropharynx- clear Neck- supple, no JVP Lymph- no cervical lymphadenopathy Lungs- Clear to ausculation bilaterally, normal work of breathing Heart- irregular rate and rhythm, no murmurs, rubs or gallops, PMI not laterally displaced GI- soft, NT, ND, + BS Extremities- no clubbing, cyanosis, or edema MS- no significant deformity or atrophy Skin- no rash or lesion Psych- euthymic mood, full affect Neuro- strength and sensation are intact  EKG- afib at 89 bpm qrs int 76 ms, qtc 420 ms  Echo-1. Left ventricular ejection fraction, by estimation, is 55 to 60%. The  left ventricle has normal function. The left ventricle has no regional  wall motion abnormalities. There is mild left ventricular hypertrophy.  Left ventricular diastolic parameters  are indeterminate.  2. Right ventricular systolic function is mildly reduced. The right  ventricular size is normal. There is mildly elevated pulmonary artery  systolic pressure.  3. Left atrial size was severely dilated.  4. Right atrial size was severely dilated.  5. The mitral valve is normal in structure. Mild to moderate mitral valve  regurgitation. ERO 0.19 cm^2, RV 23 cc. Moderate mitral annular  calcification.  6. Tricuspid valve regurgitation is moderate.  7. The aortic valve  is tricuspid. Aortic valve regurgitation is mild.  Mild to moderate  aortic valve sclerosis/calcification is present, without  any evidence of aortic stenosis.  ZIO  patch monitors reviewed    Assessment and Plan: 1. Permanent afib at 89 bpm Pt has good rate control She is asymptomatic and has  good QOL.  Continue diltiazem 300 mg daily  Continue toprol xl 50 mg daily  She is not a rhythm control candidate for prolonged qt on AAD drugs, nor an ablation candidate for severely enlarged left atrium  Continue with rate control strategy   2. CHA2DS2VASc score of at least 7 Continue eliquis 5 mg bid   3.HTN Stable   F/u with Dr. Radford Pax as scheduled   Geroge Baseman. Marisella Puccio, [facility] [address] [place], [place] [address] [phone]

## 2020-04-29 ENCOUNTER — Telehealth: Payer: Self-pay | Admitting: *Deleted

## 2020-04-29 ENCOUNTER — Other Ambulatory Visit: Payer: Self-pay | Admitting: Physician Assistant

## 2020-04-29 DIAGNOSIS — I10 Essential (primary) hypertension: Secondary | ICD-10-CM

## 2020-04-29 DIAGNOSIS — I351 Nonrheumatic aortic (valve) insufficiency: Secondary | ICD-10-CM

## 2020-04-29 DIAGNOSIS — I5032 Chronic diastolic (congestive) heart failure: Secondary | ICD-10-CM

## 2020-04-29 DIAGNOSIS — I4819 Other persistent atrial fibrillation: Secondary | ICD-10-CM

## 2020-04-29 NOTE — Telephone Encounter (Signed)
-----   Message from Sueanne Margarita, MD sent at 04/14/2020 10:22 AM EST ----- Please let patient know that they had a successful PAP titration and let DME know that orders are in EPIC.  Please set up 6 week OV with me.

## 2020-04-29 NOTE — Telephone Encounter (Signed)
Informed patient of sleep study results and patient understanding was verbalized. Patient understands her sleep study showed they had a successful PAP titration and let DME know that orders are in EPIC. Please set up 6 week OV with me.  Left detailed message on voicemail and informed patient to call back with questions.  Upon patient request DME selection is [facility]. Patient understands she/he will be contacted by [facility] to set up her/he cpap. Patient understands to call if [facility] does not contact her/he with new setup in a timely manner. Patient understands they will be called once confirmation has been received from [facility] that they have received their new machine to schedule 10 week follow up appointment.   [facility] notified of new cpap order  Please add to airview Patient was grateful for the call and thanked me.  Left detailed message on voicemail and informed patient to call back with questions.

## 2020-04-30 ENCOUNTER — Telehealth: Payer: Self-pay | Admitting: Physician Assistant

## 2020-04-30 NOTE — Telephone Encounter (Signed)
Please contact patient to schedule apt per last AVS for further med refills. AS, CMA

## 2020-04-30 NOTE — Telephone Encounter (Signed)
Pt is aware and agreeable to her results.

## 2020-05-01 NOTE — Telephone Encounter (Signed)
  Return for HTN, HLD, Hypothyroid in 4-6 months.  Above per last AVS. Please schedule as advised by provider. AS, CMA

## 2020-05-01 NOTE — Telephone Encounter (Signed)
Left voicemail letting patient know to call and schedule.

## 2020-05-01 NOTE — Telephone Encounter (Signed)
Patient was last seen in January of 2021 and it states return for a medicare wellness and fasting labs. I went to schedule her appointment and it said her last medicare wellness was in July. Do you want me to schedule her that far out?

## 2020-05-02 ENCOUNTER — Other Ambulatory Visit: Payer: Self-pay | Admitting: Oncology

## 2020-05-02 DIAGNOSIS — Z1231 Encounter for screening mammogram for malignant neoplasm of breast: Secondary | ICD-10-CM

## 2020-05-03 ENCOUNTER — Ambulatory Visit: Payer: Medicare Other | Admitting: Physician Assistant

## 2020-05-09 ENCOUNTER — Encounter: Payer: Self-pay | Admitting: Oncology

## 2020-05-20 ENCOUNTER — Ambulatory Visit: Payer: Medicare Other | Admitting: Physician Assistant

## 2020-05-23 ENCOUNTER — Ambulatory Visit: Payer: Medicare Other | Admitting: Cardiology

## 2020-05-28 ENCOUNTER — Other Ambulatory Visit: Payer: Self-pay | Admitting: Neurology

## 2020-05-31 DIAGNOSIS — Z23 Encounter for immunization: Secondary | ICD-10-CM | POA: Diagnosis not present

## 2020-06-03 ENCOUNTER — Other Ambulatory Visit: Payer: Self-pay

## 2020-06-03 ENCOUNTER — Other Ambulatory Visit: Payer: Self-pay | Admitting: Family Medicine

## 2020-06-03 ENCOUNTER — Encounter: Payer: Self-pay | Admitting: Physician Assistant

## 2020-06-03 ENCOUNTER — Ambulatory Visit (INDEPENDENT_AMBULATORY_CARE_PROVIDER_SITE_OTHER): Payer: Medicare Other | Admitting: Physician Assistant

## 2020-06-03 VITALS — BP 94/67 | HR 91 | Temp 98.2°F | Ht 60.0 in | Wt 143.4 lb

## 2020-06-03 DIAGNOSIS — E559 Vitamin D deficiency, unspecified: Secondary | ICD-10-CM

## 2020-06-03 DIAGNOSIS — E032 Hypothyroidism due to medicaments and other exogenous substances: Secondary | ICD-10-CM

## 2020-06-03 DIAGNOSIS — J45991 Cough variant asthma: Secondary | ICD-10-CM

## 2020-06-03 DIAGNOSIS — E785 Hyperlipidemia, unspecified: Secondary | ICD-10-CM

## 2020-06-03 DIAGNOSIS — I10 Essential (primary) hypertension: Secondary | ICD-10-CM | POA: Diagnosis not present

## 2020-06-03 NOTE — Patient Instructions (Addendum)
Tapering of Gabapentin: Start taking 1 capsule once daily x 2 weeks then take every other day x 2 weeks   Heart-Healthy Eating Plan Many factors influence your heart (coronary) health, including eating and exercise habits. Coronary risk increases with abnormal blood fat (lipid) levels. Heart-healthy meal planning includes limiting unhealthy fats, increasing healthy fats, and making other diet and lifestyle changes. What is my plan? Your health care provider may recommend that you:  Limit your fat intake to _________% or less of your total calories each day.  Limit your saturated fat intake to _________% or less of your total calories each day.  Limit the amount of cholesterol in your diet to less than _________ mg per day. What are tips for following this plan? Cooking Cook foods using methods other than frying. Baking, boiling, grilling, and broiling are all good options. Other ways to reduce fat include:  Removing the skin from poultry.  Removing all visible fats from meats.  Steaming vegetables in water or broth. Meal planning  At meals, imagine dividing your plate into fourths: ? Fill one-half of your plate with vegetables and green salads. ? Fill one-fourth of your plate with whole grains. ? Fill one-fourth of your plate with lean protein foods.  Eat 4-5 servings of vegetables per day. One serving equals 1 cup raw or cooked vegetable, or 2 cups raw leafy greens.  Eat 4-5 servings of fruit per day. One serving equals 1 medium whole fruit,  cup dried fruit,  cup fresh, frozen, or canned fruit, or  cup 100% fruit juice.  Eat more foods that contain soluble fiber. Examples include apples, broccoli, carrots, beans, peas, and barley. Aim to get 25-30 g of fiber per day.  Increase your consumption of legumes, nuts, and seeds to 4-5 servings per week. One serving of dried beans or legumes equals  cup cooked, 1 serving of nuts is  cup, and 1 serving of seeds equals 1 tablespoon.    Fats  Choose healthy fats more often. Choose monounsaturated and polyunsaturated fats, such as olive and canola oils, flaxseeds, walnuts, almonds, and seeds.  Eat more omega-3 fats. Choose salmon, mackerel, sardines, tuna, flaxseed oil, and ground flaxseeds. Aim to eat fish at least 2 times each week.  Check food labels carefully to identify foods with trans fats or high amounts of saturated fat.  Limit saturated fats. These are found in animal products, such as meats, butter, and cream. Plant sources of saturated fats include palm oil, palm kernel oil, and coconut oil.  Avoid foods with partially hydrogenated oils in them. These contain trans fats. Examples are stick margarine, some tub margarines, cookies, crackers, and other baked goods.  Avoid fried foods. General information  Eat more home-cooked food and less restaurant, buffet, and fast food.  Limit or avoid alcohol.  Limit foods that are high in starch and sugar.  Lose weight if you are overweight. Losing just 5-10% of your body weight can help your overall health and prevent diseases such as diabetes and heart disease.  Monitor your salt (sodium) intake, especially if you have high blood pressure. Talk with your health care provider about your sodium intake.  Try to incorporate more vegetarian meals weekly. What foods can I eat? Fruits All fresh, canned (in natural juice), or frozen fruits. Vegetables Fresh or frozen vegetables (raw, steamed, roasted, or grilled). Green salads. Grains Most grains. Choose whole wheat and whole grains most of the time. Rice and pasta, including brown rice and pastas made with  whole wheat. Meats and other proteins Lean, well-trimmed beef, veal, pork, and lamb. Chicken and [place] without skin. All fish and shellfish. Wild duck, rabbit, pheasant, and venison. Egg whites or low-cholesterol egg substitutes. Dried beans, peas, lentils, and tofu. Seeds and most nuts. Dairy Low-fat or nonfat  cheeses, including ricotta and mozzarella. Skim or 1% milk (liquid, powdered, or evaporated). Buttermilk made with low-fat milk. Nonfat or low-fat yogurt. Fats and oils Non-hydrogenated (trans-free) margarines. Vegetable oils, including soybean, sesame, sunflower, olive, peanut, safflower, corn, canola, and cottonseed. Salad dressings or mayonnaise made with a vegetable oil. Beverages Water (mineral or sparkling). Coffee and tea. Diet carbonated beverages. Sweets and desserts Sherbet, gelatin, and fruit ice. Small amounts of dark chocolate. Limit all sweets and desserts. Seasonings and condiments All seasonings and condiments. The items listed above may not be a complete list of foods and beverages you can eat. Contact a dietitian for more options. What foods are not recommended? Fruits Canned fruit in heavy syrup. Fruit in cream or butter sauce. Fried fruit. Limit coconut. Vegetables Vegetables cooked in cheese, cream, or butter sauce. Fried vegetables. Grains Breads made with saturated or trans fats, oils, or whole milk. Croissants. Sweet rolls. Donuts. High-fat crackers, such as cheese crackers. Meats and other proteins Fatty meats, such as hot dogs, ribs, sausage, bacon, rib-eye roast or steak. High-fat deli meats, such as salami and bologna. Caviar. Domestic duck and goose. Organ meats, such as liver. Dairy Cream, sour cream, cream cheese, and creamed cottage cheese. Whole milk cheeses. Whole or 2% milk (liquid, evaporated, or condensed). Whole buttermilk. Cream sauce or high-fat cheese sauce. Whole-milk yogurt. Fats and oils Meat fat, or shortening. Cocoa butter, hydrogenated oils, palm oil, coconut oil, palm kernel oil. Solid fats and shortenings, including bacon fat, salt pork, lard, and butter. Nondairy cream substitutes. Salad dressings with cheese or sour cream. Beverages Regular sodas and any drinks with added sugar. Sweets and desserts Frosting. Pudding. Cookies. Cakes. Pies.  Milk chocolate or white chocolate. Buttered syrups. Full-fat ice cream or ice cream drinks. The items listed above may not be a complete list of foods and beverages to avoid. Contact a dietitian for more information. Summary  Heart-healthy meal planning includes limiting unhealthy fats, increasing healthy fats, and making other diet and lifestyle changes.  Lose weight if you are overweight. Losing just 5-10% of your body weight can help your overall health and prevent diseases such as diabetes and heart disease.  Focus on eating a balance of foods, including fruits and vegetables, low-fat or nonfat dairy, lean protein, nuts and legumes, whole grains, and heart-healthy oils and fats. This information is not intended to replace advice given to you by your health care provider. Make sure you discuss any questions you have with your health care provider. Document Revised: 03/26/2017 Document Reviewed: 03/26/2017 Elsevier Patient Education  2021 [employer].

## 2020-06-03 NOTE — Progress Notes (Signed)
Established Patient Office Visit  Subjective:  Patient ID: Patricia [name], female    DOB: 1941-01-21  Age: 80 y.o. MRN: [id number]  CC:  Chief Complaint  Patient presents with  . Follow-up  . Hypertension  . Hyperlipidemia  . Thyroid Problem    HPI Patricia [name] presents for follow up on hypertension, hyperlipidemia and thyroid.  HTN: Pt denies chest pain, palpitations, dizziness or lower extremity swelling. Taking medication as directed without side effects. Does not check BP at home routinely but when she does it is usually in the 120s/70s. Pt monitors sodium and reports does not drink as much as she should.  HLD: Pt taking medication as directed without issues. Denies side effects including myalgias and RUQ pain. Stays active with playing golf. States limits fried foods, like red meat and also eats vegetables and fruits.  Hypothyroidism: Patient has history of multinodular goiter. Reports was initially started on Synthroid but did not respond well and was changed to [place] thyroid and has been on same dose for a while. Denies fatigue, palpitations, heat/cold intolerance or bowel changes.  Cough: Patient reports was started on Gabapentin for a cough. Unable to recall which provider. Reports does not have a cough any longer.  Asthma: Reports compliance with Advair. Denies wheezing or shortness of breath. States during allergy season, usually fall will trigger some of her symptoms.    Past Medical History:  Diagnosis Date  . Allergic rhinitis, cause unspecified   . Aortic regurgitation 06/17/2015   mild by echo 2021  . 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 Chronic atrial fibrillation ([place])    a. Dx 04/2015 at time of stroke. b. DCCV 06/2015 - did not hold. Amio started then stopped due to QT prolongation.  . Chronic cough   . Chronic diastolic heart failure ([place]) 06/17/2015  . CKD (chronic kidney disease), stage III ([place])   .  Cough variant asthma   . Disease of pharynx or nasopharynx   . Diverticulosis   . Enlargement of right atrium 01/23/2020  . Essential hypertension   . GERD (gastroesophageal reflux disease)   . Glaucoma   . Hiatal hernia   . Hyperlipidemia   . Internal hemorrhoids   . Laryngospasm   . Mitral regurgitation    mild to moderate by echo 12/2019  . Multinodular goiter   . Osteopenia   . Postmenopausal   . Pulmonary HTN ([place])     Moderate with PASP 48mHg by echo 2018 likely Group 2 from pulmonary venous HTN from CHF>>normal PAP by echo 12/2019  . Radiation 10/22/14-11/23/14   Left Breast  . Stricture and stenosis of cervix     Past Surgical History:  Procedure Laterality Date  . BREAST LUMPECTOMY Left   . BREAST SURGERY    . CARDIOVERSION N/A 06/24/2015   Procedure: CARDIOVERSION;  Surgeon: TSueanne Margarita MD;  [facility]  Service: Cardiovascular;  Laterality: N/A;  . CARDIOVERSION N/A 05/19/2016   Procedure: CARDIOVERSION;  Surgeon: TSueanne Margarita MD;  [facility];  Service: Cardiovascular;  Laterality: N/A;  . COLONOSCOPY    . LUNG CANCER SURGERY  2003   resection carcinoid lingula-lt upper lobe  . RADIOACTIVE SEED GUIDED PARTIAL MASTECTOMY WITH AXILLARY SENTINEL LYMPH NODE BIOPSY Left 06/26/2014   Procedure: RADIOACTIVE SEED GUIDED PARTIAL MASTECTOMY WITH AXILLARY SENTINEL LYMPH NODE BIOPSY;  Surgeon: FStark Klein MD;  Location: [facility];  Service: General;  Laterality: Left;  . RE-EXCISION OF BREAST LUMPECTOMY Left 08/07/2014   Procedure: RE-EXCISION OF LEFT BREAST LUMPECTOMY;  Surgeon: Stark Klein, MD;  Location: [place];  Service: General;  Laterality: Left;  . RIGHT HEART CATH N/A 04/27/2016   Procedure: Right Heart Cath;  Surgeon: Larey Dresser, MD;  Location: [place] [facility];  Service: Cardiovascular;  Laterality: N/A;    Family History  Problem Relation Age of Onset  . Stroke Mother   . Hypertension Mother    . Hyperlipidemia Mother   . Stroke Father   . Transient ischemic attack Father   . Hyperlipidemia Father   . Hypertension Father   . Atrial fibrillation Son   . Heart attack Neg Hx     Social History   Socioeconomic History  . Marital status: Married    Spouse name: Not on file  . Number of children: Not on file  . Years of education: Not on file  . Highest education level: Not on file  Occupational History  . Not on file  Tobacco Use  . Smoking status: Former Smoker    Packs/day: 0.75    Years: 5.00    Pack years: 3.75    Types: Cigarettes    Quit date: 06/15/1966    Years since quitting: 54.0  . Smokeless tobacco: Never Used  Vaping Use  . Vaping Use: Never used  Substance and Sexual Activity  . Alcohol use: Yes    Alcohol/week: 0.0 standard drinks    Comment: occasional wine  . Drug use: No  . Sexual activity: Not Currently  Other Topics Concern  . Not on file  Social History Narrative   Retired. Lives with husband.    Right Handed   Drinks 1-2 cups caffeine daily   Social Determinants of Health   Financial Resource Strain: Not on file  Food Insecurity: Not on file  Transportation Needs: Not on file  Physical Activity: Not on file  Stress: Not on file  Social Connections: Not on file  Intimate Partner Violence: Not on file    Outpatient Medications Prior to Visit  Medication Sig Dispense Refill  . albuterol (PROVENTIL HFA;VENTOLIN HFA) 108 (90 Base) MCG/ACT inhaler Inhale 2 puffs into the lungs every 6 (six) hours as needed for wheezing or shortness of breath.    . Apoaequorin (PREVAGEN) 10 MG CAPS Take 1 capsule by mouth every morning. 1 capsule 1  . ARMOUR THYROID 60 MG tablet TAKE 1 TABLET DAILY BEFORE BREAKFAST 90 tablet 1  . atorvastatin (LIPITOR) 40 MG tablet Take 1 tablet (40 mg total) by mouth daily. 90 tablet 1  . calcium-vitamin D (OSCAL WITH D) 500-200 MG-UNIT tablet Take 1 tablet by mouth daily with breakfast. 30 tablet 0  . diltiazem  (CARDIZEM CD) 300 MG 24 hr capsule Take 1 capsule (300 mg total) by mouth daily. 90 capsule 3  . ELIQUIS 5 MG TABS tablet TAKE 1 TABLET TWICE A DAY 180 tablet 1  . fluticasone (FLONASE) 50 MCG/ACT nasal spray 1 spray each nostril following sinus rinses twice daily 16 g 2  . fluticasone-salmeterol (ADVAIR HFA) 230-21 MCG/ACT inhaler USE 2 INHALATIONS TWICE A DAY 36 g 4  . furosemide (LASIX) 20 MG tablet TAKE 2 TABLETS EVERY MORNING AND 1 TABLET EVERY EVENING 270 tablet 3  . L-Methylfolate-B12-B6-B2 (CEREFOLIN) 07-31-48-5 MG TABS TAKE 1 TABLET BY MOUTH EVERY MORNING 90 tablet 3  . levETIRAcetam (KEPPRA XR) 500 MG 24 hr tablet  TAKE 1 TABLET DAILY 90 tablet 3  . metoprolol succinate (TOPROL-XL) 50 MG 24 hr tablet Take 1 tablet (50 mg total) by mouth daily. Take with or immediately following a meal. 90 tablet 3  . Multiple Vitamins-Iron (MULTIVITAMIN/IRON) TABS Take 1 tablet by mouth daily.    Marland Kitchen omeprazole (PRILOSEC) 20 MG capsule TAKE 1 CAPSULE TWICE A DAY BEFORE MEALS 180 capsule 3  . potassium chloride SA (KLOR-CON) 20 MEQ tablet TAKE 1 TABLET TWICE A DAY (KEEP UPCOMING APPOINTMENT IN SEPTEMBER WITH DR TURNER BEFORE ANYMORE REFILLS) 180 tablet 2  . Vitamin D, Ergocalciferol, (DRISDOL) 1.25 MG (50000 UNIT) CAPS capsule TAKE 1 CAPSULE EVERY WEDNESDAY AND SUNDAY 24 capsule 3  . gabapentin (NEURONTIN) 400 MG capsule TAKE 1 CAPSULE TWICE A DAY (OFFICE VISIT REQUIRED PRIOR TO ANY FURTHER REFILLS) 60 capsule 0   Facility-Administered Medications Prior to Visit  Medication Dose Route Frequency Provider Last Rate Last Admin  . denosumab (PROLIA) injection 60 mg  60 mg Subcutaneous Once Magrinat, Virgie Dad, MD        Allergies  Allergen Reactions  . Combigan [Brimonidine Tartrate-Timolol] Itching    Eyes itch, reddened  . Other Other (See Comments)    Per patient made OU red, Sore, and sensitivity to light  . Sulfa Antibiotics Rash  . Sulfamethoxazole-Trimethoprim Hives    ROS Review of Systems A  fourteen system review of systems was performed and found to be positive as per HPI.  Objective:    Physical Exam General:  Well Developed, well nourished, in no acute distress  Neuro:  Alert and oriented,  extra-ocular muscles intact  HEENT:  Normocephalic, atraumatic, neck supple Skin:  no gross rash, warm, pink. Cardiac: Irregular rhythm  Respiratory:  ECTA B/L w/o wheezing, Not using accessory muscles, speaking in full sentences- unlabored. Vascular:  Ext warm, no cyanosis apprec.; cap RF less 2 sec. Psych:  No HI/SI, judgement and insight good, Euthymic mood. Full Affect.   BP 94/67   Pulse 91   Temp 98.2 F (36.8 C)   Ht 5' (1.524 m)   Wt 143 lb 6.4 oz (65 kg)   SpO2 95%   BMI 28.01 kg/m  Wt Readings from Last 3 Encounters:  06/03/20 143 lb 6.4 oz (65 kg)  04/16/20 140 lb 3.2 oz (63.6 kg)  03/24/20 132 lb (59.9 kg)     Health Maintenance Due  Topic Date Due  . COVID-19 Vaccine (4 - Booster for Pfizer series) 05/26/2020    There are no preventive care reminders to display for this patient.  Lab Results  Component Value Date   TSH 2.940 09/28/2019   Lab Results  Component Value Date   WBC 6.7 09/28/2019   HGB 14.5 09/28/2019   HCT 44.0 09/28/2019   MCV 95 09/28/2019   PLT 234 09/28/2019   Lab Results  Component Value Date   NA 141 09/28/2019   K 4.2 09/28/2019   CHLORIDE 105 09/22/2016   CO2 26 09/28/2019   GLUCOSE 109 (H) 09/28/2019   BUN 18 09/28/2019   CREATININE 1.02 (H) 09/28/2019   BILITOT 0.6 09/28/2019   ALKPHOS 83 09/28/2019   AST 24 09/28/2019   ALT 36 (H) 09/28/2019   PROT 6.3 09/28/2019   ALBUMIN 4.1 09/28/2019   CALCIUM 9.1 09/28/2019   ANIONGAP 11 04/19/2019   EGFR 48 (L) 09/22/2016   GFR 57.38 (L) 04/01/2016   Lab Results  Component Value Date   CHOL 139 09/28/2019   Lab Results  Component Value Date   HDL 47 09/28/2019   Lab Results  Component Value Date   LDLCALC 75 09/28/2019   Lab Results  Component Value Date    TRIG 86 09/28/2019   Lab Results  Component Value Date   CHOLHDL 3.0 09/28/2019   Lab Results  Component Value Date   HGBA1C 6.3 (H) 09/28/2019      Assessment & Plan:   Problem List Items Addressed This Visit      Cardiovascular and Mediastinum   Essential hypertension - Primary (Chronic)     Respiratory   Cough variant asthma (Chronic)     Endocrine   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 Advised patient to monitor BP/pulse at home and if BP consistently <100/60 then recommend to notify the office or cardiologist. -Recommend to increase hydration and continue low sodium diet. -Will continue to monitor.  Hyperlipidemia: -Last lipid panel: total cholesterol 139, triglycerides 86, HDL 47, LDL 75 -Continue current medication regimen. -Recommend to continue a heart healthy and limit red meat. Continue to stay as active as possible. -Will continue to monitor alongside cardiology and repeat lipid panel and hepatic function with [facility].  Hypothyroidism: -Last TSH 2.940, wnl. Asymptomatic. -Will continue current medication regimen and plant to repeat TSH with [facility].  Cough variant asthma: -Discussed with patient trial discontinuing Gabapentin if has been on medication for several years and no longer symptomatic. Patient verbalized understanding and agreeable. Provided tapering instructions. If cough resumes, then recommend restarting medication. -Continue Advair for asthma.    No orders of the defined types were placed in this encounter.   Follow-up: Return in about 4 months (around 10/03/2020) for [place] and [facility].   Note:  This note was prepared with assistance of Dragon voice recognition software. Occasional wrong-word or sound-a-like substitutions may have occurred due to the inherent limitations of voice recognition software.  Lorrene Reid, PA-C

## 2020-06-05 DIAGNOSIS — H401133 Primary open-angle glaucoma, bilateral, severe stage: Secondary | ICD-10-CM | POA: Diagnosis not present

## 2020-06-19 ENCOUNTER — Other Ambulatory Visit: Payer: Self-pay | Admitting: Physician Assistant

## 2020-06-19 DIAGNOSIS — E032 Hypothyroidism due to medicaments and other exogenous substances: Secondary | ICD-10-CM

## 2020-06-21 ENCOUNTER — Other Ambulatory Visit: Payer: Self-pay | Admitting: Cardiology

## 2020-06-21 NOTE — Telephone Encounter (Signed)
Prescription refill request for Eliquis received. Indication: afib  Last office visit: [place], 03/19/2020 Scr: 1.02, 09/28/2019 Age: 80 yo  Weight: 65 kg    Pt is on the correct dose of Eliquis per dosing criteria, prescription refill sent for Eliquis 5mg  BID.

## 2020-06-25 ENCOUNTER — Ambulatory Visit
Admission: RE | Admit: 2020-06-25 | Discharge: 2020-06-25 | Disposition: A | Discharge status: Home / Self Care | Payer: Medicare Other | Source: Ambulatory Visit | Attending: Oncology | Admitting: Oncology

## 2020-06-25 ENCOUNTER — Other Ambulatory Visit: Payer: Self-pay

## 2020-06-25 DIAGNOSIS — Z1231 Encounter for screening mammogram for malignant neoplasm of breast: Secondary | ICD-10-CM | POA: Diagnosis not present

## 2020-06-27 ENCOUNTER — Ambulatory Visit (INDEPENDENT_AMBULATORY_CARE_PROVIDER_SITE_OTHER): Payer: Medicare Other

## 2020-06-27 ENCOUNTER — Other Ambulatory Visit: Payer: Self-pay

## 2020-06-27 ENCOUNTER — Ambulatory Visit (INDEPENDENT_AMBULATORY_CARE_PROVIDER_SITE_OTHER): Payer: Medicare Other | Admitting: Cardiology

## 2020-06-27 ENCOUNTER — Encounter: Payer: Self-pay | Admitting: Cardiology

## 2020-06-27 VITALS — BP 118/60 | HR 94 | Ht 60.0 in | Wt 138.0 lb

## 2020-06-27 DIAGNOSIS — G4733 Obstructive sleep apnea (adult) (pediatric): Secondary | ICD-10-CM

## 2020-06-27 DIAGNOSIS — I4819 Other persistent atrial fibrillation: Secondary | ICD-10-CM | POA: Diagnosis not present

## 2020-06-27 DIAGNOSIS — I340 Nonrheumatic mitral (valve) insufficiency: Secondary | ICD-10-CM

## 2020-06-27 DIAGNOSIS — I10 Essential (primary) hypertension: Secondary | ICD-10-CM

## 2020-06-27 DIAGNOSIS — I351 Nonrheumatic aortic (valve) insufficiency: Secondary | ICD-10-CM

## 2020-06-27 DIAGNOSIS — I272 Pulmonary hypertension, unspecified: Secondary | ICD-10-CM | POA: Diagnosis not present

## 2020-06-27 DIAGNOSIS — I5032 Chronic diastolic (congestive) heart failure: Secondary | ICD-10-CM

## 2020-06-27 NOTE — Progress Notes (Signed)
Cardiology Office Note:    Date:  06/27/2020   ID:  Patricia [name], DOB 30-Jul-1940, MRN [id number]  PCP:  Lorrene Reid, PA-C  Cardiologist:  Fransico Him, MD   Referring MD: Lorrene Reid, PA-C   Chief Complaint  Patient presents with  . Congestive Heart Failure  . Hypertension  . Atrial Fibrillation  . Mitral Regurgitation  . Sleep Apnea    History of Present Illness:    Patricia [name] is a 80 y.o. female with history of chronic AFib on Eliquis (amiodarone stopped because of prolonged QT), CVA, chronic diastolic CHF, mild  AI, normal NST 2017, lung CA resection 2003, breast CA s/p lumpectomy/Tamoxifen/XRT, HTN, HLD.  2D echo in 2018 showed moderate PHTN with PASP 57mmHg and RHC 04/2016 with borderline pulmonary HTN.  Repeat 2D echo 12/2019 showed normal LVF with mild to moderate MR and normal PASP.  She is followed in afib clinic and now permanent atrial fibrillation.    She underwent sleep study in Nov 2021 and was found to have mild OSA with an AHI of 9.6/hr with no central apneas and underwent CPAP titration but could not be adequately treated due to ongoing events and underwent BiPAP titration and was ultimately placed on BIPAP.   She is here today for followup and is doing well.  She denies any chest pain or pressure, SOB, DOE, PND, orthopnea, LE edema, dizziness, palpitations or syncope. She is compliant with her meds and is tolerating meds with no SE.    Past Medical History:  Diagnosis Date  . Allergic rhinitis, cause unspecified   . Aortic regurgitation 06/17/2015   mild by echo 2021  . 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 Chronic atrial fibrillation ([place])    a. Dx 04/2015 at time of stroke. b. DCCV 06/2015 - did not hold. Amio started then stopped due to QT prolongation.  . Chronic cough   . Chronic diastolic heart failure ([place]) 06/17/2015  . CKD (chronic kidney disease), stage III ([place])   . Cough variant asthma   .  Disease of pharynx or nasopharynx   . Diverticulosis   . Enlargement of right atrium 01/23/2020  . Essential hypertension   . GERD (gastroesophageal reflux disease)   . Glaucoma   . Hiatal hernia   . Hyperlipidemia   . Internal hemorrhoids   . Laryngospasm   . Mitral regurgitation    mild to moderate by echo 12/2019  . Multinodular goiter   . Osteopenia   . Postmenopausal   . Pulmonary HTN ([place])     Moderate with PASP 48mmHg by echo 2018 likely Group 2 from pulmonary venous HTN from CHF>>normal PAP by echo 12/2019  . Radiation 10/22/14-11/23/14   Left Breast  . Stricture and stenosis of cervix     Past Surgical History:  Procedure Laterality Date  . BREAST LUMPECTOMY Left   . BREAST SURGERY    . CARDIOVERSION N/A 06/24/2015   Procedure: CARDIOVERSION;  Surgeon: Sueanne Margarita, MD;  Location: [place];  Service: Cardiovascular;  Laterality: N/A;  . CARDIOVERSION N/A 05/19/2016   Procedure: CARDIOVERSION;  Surgeon: Sueanne Margarita, MD;  [facility];  Service: Cardiovascular;  Laterality: N/A;  . COLONOSCOPY    . LUNG CANCER SURGERY  2003   resection carcinoid lingula-lt upper lobe  . RADIOACTIVE SEED GUIDED PARTIAL MASTECTOMY WITH AXILLARY SENTINEL LYMPH NODE BIOPSY Left 06/26/2014   Procedure: RADIOACTIVE  SEED GUIDED PARTIAL MASTECTOMY WITH AXILLARY SENTINEL LYMPH NODE BIOPSY;  Surgeon: Stark Klein, MD;  Location: [place];  Service: General;  Laterality: Left;  . RE-EXCISION OF BREAST LUMPECTOMY Left 08/07/2014   Procedure: RE-EXCISION OF LEFT BREAST LUMPECTOMY;  Surgeon: Stark Klein, MD;  Location: [place];  Service: General;  Laterality: Left;  . RIGHT HEART CATH N/A 04/27/2016   Procedure: Right Heart Cath;  Surgeon: Larey Dresser, MD;  Location: [place] [facility];  Service: Cardiovascular;  Laterality: N/A;    Current Medications: Current Meds  Medication Sig  . albuterol (PROVENTIL HFA;VENTOLIN HFA) 108 (90 Base) MCG/ACT  inhaler Inhale 2 puffs into the lungs every 6 (six) hours as needed for wheezing or shortness of breath.  . Apoaequorin (PREVAGEN) 10 MG CAPS Take 1 capsule by mouth every morning.  Francia Greaves THYROID 60 MG tablet TAKE 1 TABLET DAILY BEFORE BREAKFAST  . atorvastatin (LIPITOR) 40 MG tablet Take 1 tablet (40 mg total) by mouth daily.  . calcium-vitamin D (OSCAL WITH D) 500-200 MG-UNIT tablet Take 1 tablet by mouth daily with breakfast.  . diltiazem (CARDIZEM CD) 300 MG 24 hr capsule Take 1 capsule (300 mg total) by mouth daily.  Marland Kitchen ELIQUIS 5 MG TABS tablet TAKE 1 TABLET TWICE A DAY  . fluticasone (FLONASE) 50 MCG/ACT nasal spray 1 spray each nostril following sinus rinses twice daily  . fluticasone-salmeterol (ADVAIR HFA) 230-21 MCG/ACT inhaler USE 2 INHALATIONS TWICE A DAY  . furosemide (LASIX) 20 MG tablet TAKE 2 TABLETS EVERY MORNING AND 1 TABLET EVERY EVENING  . L-Methylfolate-B12-B6-B2 (CEREFOLIN) 07-31-48-5 MG TABS TAKE 1 TABLET BY MOUTH EVERY MORNING  . levETIRAcetam (KEPPRA XR) 500 MG 24 hr tablet TAKE 1 TABLET DAILY  . metoprolol succinate (TOPROL-XL) 50 MG 24 hr tablet Take 1 tablet (50 mg total) by mouth daily. Take with or immediately following a meal.  . Multiple Vitamins-Iron (MULTIVITAMIN/IRON) TABS Take 1 tablet by mouth daily.  Marland Kitchen omeprazole (PRILOSEC) 20 MG capsule TAKE 1 CAPSULE TWICE A DAY BEFORE MEALS  . potassium chloride SA (KLOR-CON) 20 MEQ tablet TAKE 1 TABLET TWICE A DAY (KEEP UPCOMING APPOINTMENT IN SEPTEMBER WITH DR Daisy Mcneel BEFORE ANYMORE REFILLS)  . Vitamin D, Ergocalciferol, (DRISDOL) 1.25 MG (50000 UNIT) CAPS capsule TAKE 1 CAPSULE EVERY WEDNESDAY AND SUNDAY     Allergies:   Combigan [brimonidine tartrate-timolol], Other, Sulfa antibiotics, and Sulfamethoxazole-trimethoprim   Social History   Socioeconomic History  . Marital status: Married    Spouse name: Not on file  . Number of children: Not on file  . Years of education: Not on file  . Highest education level:  Not on file  Occupational History  . Not on file  Tobacco Use  . Smoking status: Former Smoker    Packs/day: 0.75    Years: 5.00    Pack years: 3.75    Types: Cigarettes    Quit date: 06/15/1966    Years since quitting: 54.0  . Smokeless tobacco: Never Used  Vaping Use  . Vaping Use: Never used  Substance and Sexual Activity  . Alcohol use: Yes    Alcohol/week: 0.0 standard drinks    Comment: occasional wine  . Drug use: No  . Sexual activity: Not Currently  Other Topics Concern  . Not on file  Social History Narrative   Retired. Lives with husband.    Right Handed   Drinks 1-2 cups caffeine daily   Social Determinants of Radio broadcast assistant  Strain: Not on file  Food Insecurity: Not on file  Transportation Needs: Not on file  Physical Activity: Not on file  Stress: Not on file  Social Connections: Not on file     Family History: The patient's family history includes Atrial fibrillation in her son; Hyperlipidemia in her father and mother; Hypertension in her father and mother; Stroke in her father and mother; Transient ischemic attack in her father. There is no history of Heart attack.  ROS:   Please see the history of present illness.    ROS  All other systems reviewed and negative.   EKGs/Labs/Other Studies Reviewed:    The following studies were reviewed today:  2D echo 12/2019 IMPRESSIONS   1. Left ventricular ejection fraction, by estimation, is 55 to 60%. The  left ventricle has normal function. The left ventricle has no regional  wall motion abnormalities. There is mild left ventricular hypertrophy.  Left ventricular diastolic parameters  are indeterminate.  2. Right ventricular systolic function is mildly reduced. The right  ventricular size is normal. There is mildly elevated pulmonary artery  systolic pressure.  3. Left atrial size was severely dilated.  4. Right atrial size was severely dilated.  5. The mitral valve is normal in  structure. Mild to moderate mitral valve  regurgitation. ERO 0.19 cm^2, RV 23 cc. Moderate mitral annular  calcification.  6. Tricuspid valve regurgitation is moderate.  7. The aortic valve is tricuspid. Aortic valve regurgitation is mild.  Mild to moderate aortic valve sclerosis/calcification is present, without  any evidence of aortic stenosis.   EKG:  EKG is not ordered today.  Recent Labs: 09/28/2019: ALT 36; BUN 18; Creatinine, Ser 1.02; Hemoglobin 14.5; Platelets 234; Potassium 4.2; Sodium 141; TSH 2.940   Recent Lipid Panel    Component Value Date/Time   CHOL 139 09/28/2019 0847   TRIG 86 09/28/2019 0847   TRIG 52 01/13/2006 1012   HDL 47 09/28/2019 0847   CHOLHDL 3.0 09/28/2019 0847   CHOLHDL 3 04/01/2016 0952   VLDL 19.2 04/01/2016 0952   LDLCALC 75 09/28/2019 0847   LDLDIRECT 174.3 06/04/2009 0000    Physical Exam:    VS:  BP 118/60   Pulse 94   Ht 5' (1.524 m)   Wt 138 lb (62.6 kg)   SpO2 96%   BMI 26.95 kg/m     Wt Readings from Last 3 Encounters:  06/27/20 138 lb (62.6 kg)  06/03/20 143 lb 6.4 oz (65 kg)  04/16/20 140 lb 3.2 oz (63.6 kg)     GEN: Well nourished, well developed in no acute distress HEENT: Normal NECK: No JVD; No carotid bruits LYMPHATICS: No lymphadenopathy CARDIAC:irregularly irregular, no murmurs, rubs, gallops RESPIRATORY:  Clear to auscultation without rales, wheezing or rhonchi  ABDOMEN: Soft, non-tender, non-distended MUSCULOSKELETAL:  No edema; No deformity  SKIN: Warm and dry NEUROLOGIC:  Alert and oriented x 3 PSYCHIATRIC:  Normal affect    ASSESSMENT:    1. Chronic diastolic heart failure ([place])   2. Nonrheumatic aortic valve insufficiency   3. Essential hypertension   4. Persistent atrial fibrillation ([place])   5. Pulmonary HTN ([place])   6. Nonrheumatic mitral valve regurgitation   7. OSA (obstructive sleep apnea)    PLAN:    In order of problems listed above:  1.  Chronic diastolic CHF  -she does not appear  volume overloaded on exam today -weight is down 5lbs today -continue on lasix 40mg  daily -repeat BMET today  2.  Aortic  insufficiency -mild AI by echo 12/2019  3.  HTN  -Bp is adequately controlled on exam today -continue Cardizem CD 300mg  daily  4.  Permanent atrial fibrillation  -she is asymptomatic and did not hold in NSR after DCCV due to severely dilated LA -Her LA is severely dilated so not a candidate for ablation.   -She did not tolerate amio due to prolonged QT so likely would happen with other Class III AAD.   -She is completely asymptomatic and now on rate control therapy.   -HR is well controlled on exam today -continue on Cardizem CD 300mg  daily, Toprol XL 25mg  daily and Eliquis 5mg  BID for CHADS2VASC score of 5.   -she denies any bleeding Issues on DOAC -repeat BMET and CBC -re[eat 48 hour ZIopatch to assess adequacy of HR control  5.  Pulmonary HTN  - RHC with no significant pulmonary HTN with mean PAP 53mmHg and PCWP elevated at 57mmHg in the setting of afib.   -2D echo 12/2019 with no PHTN  6.  Mitral Regurgitation -mild to moderate by echo 12/2019 -repeat echo in 1 year  7.  OSA  -she has mild OSA on sleep study and BIPAP was ordered but she has not gotten her device yet -I will check with the DME  Medication Adjustments/Labs and Tests Ordered: Current medicines are reviewed at length with the patient today.  Concerns regarding medicines are outlined above.  No orders of the defined types were placed in this encounter.  No orders of the defined types were placed in this encounter.   Signed, Fransico Him, MD  06/27/2020 2:44 PM    [place]

## 2020-06-27 NOTE — Addendum Note (Signed)
Addended by: Antonieta Iba on: 06/27/2020 02:53 PM   Modules accepted: Orders

## 2020-06-27 NOTE — Patient Instructions (Signed)
Medication Instructions:  Your physician recommends that you continue on your current medications as directed. Please refer to the Current Medication list given to you today.  *If you need a refill on your cardiac medications before your next appointment, please call your pharmacy*   Lab Work: TODAY: BMET, CBC If you have labs (blood work) drawn today and your tests are completely normal, you will receive your results only by: Marland Kitchen MyChart Message (if you have MyChart) OR . A paper copy in the mail If you have any lab test that is abnormal or we need to change your treatment, we will call you to review the results.   Testing/Procedures: Your physician has recommended that you wear an event monitor. Event monitors are medical devices that record the heart's electrical activity. Doctors most often [name] these monitors to diagnose arrhythmias. Arrhythmias are problems with the speed or rhythm of the heartbeat. The monitor is a small, portable device. You can wear one while you do your normal daily activities. This is usually used to diagnose what is causing palpitations/syncope (passing out).   Follow-Up: At [facility], you and your health needs are our priority.  As part of our continuing mission to provide you with exceptional heart care, we have created designated Provider Care Teams.  These Care Teams include your primary Cardiologist (physician) and Advanced Practice Providers (APPs -  Physician Assistants and Nurse Practitioners) who all work together to provide you with the care you need, when you need it.  Your next appointment:   6 month(s)  The format for your next appointment:   In Person  Provider:   You may see Fransico Him, MD or one of the following Advanced Practice Providers on your designated Care Team:    Melina Copa, PA-C  Ermalinda Barrios, PA-C    Other Instructions Bryn Gulling- Long Term Monitor Instructions   Your physician has requested you wear a ZIO patch monitor  for _3_ days.  This is a single patch monitor.   [employer] supplies one patch monitor per enrollment. Additional stickers are not available. Please do not apply patch if you will be having a Nuclear Stress Test, Echocardiogram, Cardiac CT, MRI, or Chest Xray during the period you would be wearing the monitor. The patch cannot be worn during these tests. You cannot remove and re-apply the ZIO XT patch monitor.  Your ZIO patch monitor will be sent Fed Ex from [employer] directly to your home address. It may take 3-5 days to receive your monitor after you have been enrolled.  Once you have received your monitor, please review the enclosed instructions. Your monitor has already been registered assigning a specific monitor serial # to you.  Billing and Patient Assistance Program Information   We have supplied [employer] with any of your insurance information on file for billing purposes. [employer] offers a sliding scale Patient Assistance Program for patients that do not have insurance, or whose insurance does not completely cover the cost of the ZIO monitor.   You must apply for the Patient Assistance Program to qualify for this discounted rate.     To apply, please call [employer] at [phone], select option 4, then select option 2, and ask to apply for Patient Assistance Program.  Theodore Demark will ask your household income, and how many people are in your household.  They will quote your out-of-pocket cost based on that information.  [employer] will also be able to set up a 27-month, interest-free payment plan if needed.  Applying the monitor   Shave hair from upper left chest.  Hold abrader disc by orange tab. Rub abrader in 40 strokes over the upper left chest as indicated in your monitor instructions.  Clean area with 4 enclosed alcohol pads. Let dry.  Apply patch as indicated in monitor instructions. Patch will be placed under collarbone on left side of chest with arrow pointing upward.  Rub patch  adhesive wings for 2 minutes. Remove white label marked "1". Remove the white label marked "2". Rub patch adhesive wings for 2 additional minutes.  While looking in a mirror, press and release button in center of patch. A small green light will flash 3-4 times. This will be your only indicator that the monitor has been turned on. ?  Do not shower for the first 24 hours. You may shower after the first 24 hours.  Press the button if you feel a symptom. You will hear a small click. Record Date, Time and Symptom in the Patient Logbook.  When you are ready to remove the patch, follow instructions on the last 2 pages of the Patient Logbook. Stick patch monitor onto the last page of Patient Logbook.  Place Patient Logbook in the blue and white box.  Use locking tab on box and tape box closed securely.  The blue and white box has prepaid postage on it. Please place it in the mailbox as soon as possible. Your physician should have your test results approximately 7 days after the monitor has been mailed back to [facility].  Call [place] at [phone] if you have questions regarding your ZIO XT patch monitor. Call them immediately if you see an orange light blinking on your monitor.  If your monitor falls off in less than 4 days, contact our Monitor department at [phone]. ?If your monitor becomes loose or falls off after 4 days call [employer] at [phone] for suggestions on securing your monitor.?

## 2020-06-28 LAB — BASIC METABOLIC PANEL
BUN/Creatinine Ratio: 24 (ref 12–28)
BUN: 23 mg/dL (ref 8–27)
CO2: 23 mmol/L (ref 20–29)
Calcium: 9.1 mg/dL (ref 8.7–10.3)
Chloride: 104 mmol/L (ref 96–106)
Creatinine, Ser: 0.97 mg/dL (ref 0.57–1.00)
Glucose: 103 mg/dL — ABNORMAL HIGH (ref 65–99)
Potassium: 4.2 mmol/L (ref 3.5–5.2)
Sodium: 143 mmol/L (ref 134–144)
eGFR: 59 mL/min/{1.73_m2} — ABNORMAL LOW (ref >59)

## 2020-06-28 LAB — CBC
Hematocrit: 46.5 % (ref 34.0–46.6)
Hemoglobin: 15.6 g/dL (ref 11.1–15.9)
MCH: 30.8 pg (ref 26.6–33.0)
MCHC: 33.5 g/dL (ref 31.5–35.7)
MCV: 92 fL (ref 79–97)
Platelets: 246 10*3/uL (ref 150–450)
RBC: 5.06 x10E6/uL (ref 3.77–5.28)
RDW: 13.5 % (ref 11.7–15.4)
WBC: 6.5 10*3/uL (ref 3.4–10.8)

## 2020-07-03 DIAGNOSIS — I4819 Other persistent atrial fibrillation: Secondary | ICD-10-CM | POA: Diagnosis not present

## 2020-07-09 DIAGNOSIS — I351 Nonrheumatic aortic (valve) insufficiency: Secondary | ICD-10-CM | POA: Diagnosis not present

## 2020-07-09 DIAGNOSIS — I4819 Other persistent atrial fibrillation: Secondary | ICD-10-CM | POA: Diagnosis not present

## 2020-07-09 DIAGNOSIS — I5032 Chronic diastolic (congestive) heart failure: Secondary | ICD-10-CM | POA: Diagnosis not present

## 2020-07-09 DIAGNOSIS — I10 Essential (primary) hypertension: Secondary | ICD-10-CM | POA: Diagnosis not present

## 2020-07-10 ENCOUNTER — Telehealth: Payer: Self-pay

## 2020-07-10 MED ORDER — METOPROLOL SUCCINATE ER 50 MG PO TB24: 75.0000 mg | ORAL_TABLET | Freq: Every day | ORAL | 3 refills | Status: DC

## 2020-07-10 NOTE — Telephone Encounter (Signed)
The patient's husband has been notified of the result and verbalized understanding.  All questions (if any) were answered. Antonieta Iba, RN 07/10/2020 1:40 PM  She will increase her Toprol to 75 mg daily and record her HR and BP daily for one week and call with results.

## 2020-07-10 NOTE — Telephone Encounter (Signed)
-----   Message from Sueanne Margarita, MD sent at 07/09/2020  5:19 PM EDT ----- Heart monitor showed atrial fibrillation with a few extra heart beats from the bottom of the heart.  Her average HR is 94bpm but gets up as high as 155bpm.  I would like her to increase her Toprol XL to 75mg  daily and check BP and HR daily for a week and call with results.

## 2020-07-15 ENCOUNTER — Encounter: Payer: Self-pay | Admitting: Neurology

## 2020-07-15 ENCOUNTER — Ambulatory Visit (INDEPENDENT_AMBULATORY_CARE_PROVIDER_SITE_OTHER): Payer: Medicare Other | Admitting: Neurology

## 2020-07-15 VITALS — BP 104/73 | HR 58 | Ht 60.0 in | Wt 137.8 lb

## 2020-07-15 DIAGNOSIS — G40909 Epilepsy, unspecified, not intractable, without status epilepticus: Secondary | ICD-10-CM | POA: Diagnosis not present

## 2020-07-15 DIAGNOSIS — G3184 Mild cognitive impairment, so stated: Secondary | ICD-10-CM | POA: Diagnosis not present

## 2020-07-15 DIAGNOSIS — Z8673 Personal history of transient ischemic attack (TIA), and cerebral infarction without residual deficits: Secondary | ICD-10-CM

## 2020-07-15 NOTE — Patient Instructions (Signed)
I had a long discussion with the patient and her  husband regarding her mild cognitive impairment and prior history of stroke and seizures and answered questions. I recommend she continue Prevagen 10 mg and  Cerefolin NAC 1 capsule daily to help with cognitive impairment as well as participate in mentally challenging activities like solving crossword puzzles, playing bridge and sudoku.   We also discussed memory compensation strategies. She was advised to continue Keppra XR 500 mg daily for seizure prophylaxis and continue Eliquis for stroke prevention for atrial fibrillation and maintain aggressive risk factor modification. .  I also encouraged her to do memory compensation strategies. She will return for follow-up for in person visit in 6 months or call earlier if necessary. Memory Compensation Strategies  1. Use "WARM" strategy.  W= write it down  A= associate it  R= repeat it  M= make a mental note  2.   You can keep a Social worker.  Use a 3-ring notebook with sections for the following: calendar, important names and phone numbers,  medications, doctors' names/phone numbers, lists/reminders, and a section to journal what you did  each day.   3.    Use a calendar to write appointments down.  4.    Write yourself a schedule for the day.  This can be placed on the calendar or in a separate section of the Memory Notebook.  Keeping a  regular schedule can help memory.  5.    Use medication organizer with sections for each day or morning/evening pills.  You may need help loading it  6.    Keep a basket, or pegboard by the door.  Place items that you need to take out with you in the basket or on the pegboard.  You may also want to  include a message board for reminders.  7.    Use sticky notes.  Place sticky notes with reminders in a place where the task is performed.  For example: " turn off the  stove" placed by the stove, "lock the door" placed on the door at eye level, " take your  medications" on  the bathroom mirror or by the place where you normally take your medications.  8.    Use alarms/timers.  Use while cooking to remind yourself to check on food or as a reminder to take your medicine, or as a  reminder to make a call, or as a reminder to perform another task, etc.

## 2020-07-15 NOTE — Progress Notes (Signed)
[facility] [address] [place]. [place] [address] [phone]       OFFICE FOLLOW-UP NOTE  Patricia [name] Date of Birth:  06-Apr-1940 Medical Record Number:  [id number]   HPI: Initial visit 03/27/2019 [name] is a 80 year old pleasant Caucasian lady with past medical history of left breast cancer with radiation, persistent atrial fibrillation on Eliquis, multinodular goiter, laryngeal spasms, essential hypertension, chronic kidney disease, diastolic heart failure, and hyperlipidemia who was last seen in office on 11/19/2016.  She is seen today for follow-up following recent ER visit on November 30, 2018.  She was doing well until 2 days prior when the husband sent her to another room at home to get her checkbook.  The patient recalled trying to move a rolling chair out of her way and suddenly she fell backwards leaning on the wall and slid down to the floor.  The husband arrived a few minutes later and noted that she was unresponsive and was not speaking or answering his questions.  He called EMS and by the time they arrived patient was gradually returning back to normal.  She could not remember the episode and did not have a memory of the event.  She denied any chest pain, sweating, palpitations, headache, tongue bite injury or incontinence.  CT scan of the head was unremarkable and MRI scan showed no evidence of acute stroke or hemorrhage.  EEG showed bitemporal focal slowing.  Patient was on Eliquis for atrial fibrillation and prior stroke which was continued.  She states she is done well since then and has not had any further episodes of office consciousness or seizures.  The patient's husband however feels that is noticed that her memory is not the same and she has had some cognitive decline.  There have been no episodes of confusion, disorientation or agitation.  She has not had any delusions hallucinations.  She has not had any lab work to look for reversible causes of  cognitive impairment.  She denies any headache, falls or injuries.  She feels her balance may be slightly off. Update 06/22/2019 : She returns for follow-up after last visit 6 months ago. She is accompanied by her husband. She states that him memory difficulties are about unchanged. She is started taking Prevagen 10 mg 1 capsule daily and tolerating it well. She has had no further episodes of confusion or seizure-like episode and she remains on Keppra XR 500 mg daily which is tolerating well without side effects. She has had no recurrent stroke or TIAs either. She remains on Eliquis and is tolerating it well with minor bruising but no bleeding. Blood pressure is well controlled though it is elevated today in office. She has been participating in mentally challenging activities and she does have family history of Alzheimer's and worries about it and wants me to try something else to help . Update 01/04/2020: She returns for follow-up after last visit 6 months ago.  She is accompanied by her husband.  She states she is doing well.  Subjective memory difficulties appear about unchanged.  She remains on provision as well as she started taking Cerefolin NAC and is tolerating it well.  She has had no episodes of seizure confusion.  She is tolerating Keppra XR 500 mg without any side effects.  She has had no recurrent stroke or TIAs either.  She remains on Eliquis which is tolerating well without bruising or bleeding.  Her blood pressures well controlled and usually in the 875 systolic range  and today it is 117/74 in office.  She has no new complaints. Update 07/15/2020 : She returns for follow-up after last visit 6 months ago.  She is accompanied by her husband.  Patient states she is doing well and the memory difficulties are unchanged however the husband feels that the short-term memory is getting worse.  However she remains quite independent in activities of daily living.  She just does not remember to write things  down.  She still ending most of her own affairs.  She remains on Prevagen as well as Cerefolin NAC.  She plays bridge regularly but is not doing any other cognitively challenging activities.  She has had no recurrent stroke or TIA symptoms.  She has had no other episodes of loss of consciousness or seizures either.  She remains on Eliquis which is tolerating well with only minor bruising and no bleeding.  Blood pressure is well controlled.  Dr. Radford Pax her cardiologist increase the dose of metoprolol and blood pressure today is 104/73 and heart rate is 58.  She also remains on Keppra XR 5 mg daily which she is tolerating well without side effects.  She did have follow-up carotid ultrasound done on 01/31/2020 which showed no significant extracranial stenosis.  She has no new complaints. ROS:   14 system review of systems is positive for confusion, memory loss, disorientation and all other systems negative  PMH:  Past Medical History:  Diagnosis Date  . Allergic rhinitis, cause unspecified   . Aortic regurgitation 06/17/2015   mild by echo 2021  . 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 Chronic atrial fibrillation ([place])    a. Dx 04/2015 at time of stroke. b. DCCV 06/2015 - did not hold. Amio started then stopped due to QT prolongation.  . Chronic cough   . Chronic diastolic heart failure ([place]) 06/17/2015  . CKD (chronic kidney disease), stage III ([place])   . Cough variant asthma   . Disease of pharynx or nasopharynx   . Diverticulosis   . Enlargement of right atrium 01/23/2020  . Essential hypertension   . GERD (gastroesophageal reflux disease)   . Glaucoma   . Hiatal hernia   . Hyperlipidemia   . Internal hemorrhoids   . Laryngospasm   . Mitral regurgitation    mild to moderate by echo 12/2019  . Multinodular goiter   . Osteopenia   . Postmenopausal   . Pulmonary HTN ([place])     Moderate with PASP 49mmHg by echo 2018 likely Group 2 from  pulmonary venous HTN from CHF>>normal PAP by echo 12/2019  . Radiation 10/22/14-11/23/14   Left Breast  . Stricture and stenosis of cervix     Social History:  Social History   Socioeconomic History  . Marital status: Married    Spouse name: [name]  . Number of children: Not on file  . Years of education: Not on file  . Highest education level: Not on file  Occupational History  . Not on file  Tobacco Use  . Smoking status: Former Smoker    Packs/day: 0.75    Years: 5.00    Pack years: 3.75    Types: Cigarettes    Quit date: 06/15/1966    Years since quitting: 54.1  . Smokeless tobacco: Never Used  Vaping Use  . Vaping Use: Never used  Substance and Sexual Activity  . Alcohol use: Yes  Alcohol/week: 0.0 standard drinks    Comment: occasional wine  . Drug use: No  . Sexual activity: Not Currently  Other Topics Concern  . Not on file  Social History Narrative   Retired. Lives with husband.    Right Handed   Drinks 1-2 cups caffeine daily   Social Determinants of Health   Financial Resource Strain: Not on file  Food Insecurity: Not on file  Transportation Needs: Not on file  Physical Activity: Not on file  Stress: Not on file  Social Connections: Not on file  Intimate Partner Violence: Not on file    Medications:   Current Outpatient Medications on File Prior to Visit  Medication Sig Dispense Refill  . albuterol (PROVENTIL HFA;VENTOLIN HFA) 108 (90 Base) MCG/ACT inhaler Inhale 2 puffs into the lungs every 6 (six) hours as needed for wheezing or shortness of breath.    . Apoaequorin (PREVAGEN) 10 MG CAPS Take 1 capsule by mouth every morning. 1 capsule 1  . ARMOUR THYROID 60 MG tablet TAKE 1 TABLET DAILY BEFORE BREAKFAST 90 tablet 1  . atorvastatin (LIPITOR) 40 MG tablet Take 1 tablet (40 mg total) by mouth daily. 90 tablet 1  . calcium-vitamin D (OSCAL WITH D) 500-200 MG-UNIT tablet Take 1 tablet by mouth daily with breakfast. 30 tablet 0  . diltiazem  (CARDIZEM CD) 300 MG 24 hr capsule Take 1 capsule (300 mg total) by mouth daily. 90 capsule 3  . ELIQUIS 5 MG TABS tablet TAKE 1 TABLET TWICE A DAY 180 tablet 1  . fluticasone (FLONASE) 50 MCG/ACT nasal spray 1 spray each nostril following sinus rinses twice daily 16 g 2  . fluticasone-salmeterol (ADVAIR HFA) 230-21 MCG/ACT inhaler USE 2 INHALATIONS TWICE A DAY 36 g 4  . furosemide (LASIX) 20 MG tablet TAKE 2 TABLETS EVERY MORNING AND 1 TABLET EVERY EVENING 270 tablet 3  . L-Methylfolate-B12-B6-B2 (CEREFOLIN) 07-31-48-5 MG TABS TAKE 1 TABLET BY MOUTH EVERY MORNING 90 tablet 3  . levETIRAcetam (KEPPRA XR) 500 MG 24 hr tablet TAKE 1 TABLET DAILY 90 tablet 3  . metoprolol succinate (TOPROL-XL) 50 MG 24 hr tablet Take 1.5 tablets (75 mg total) by mouth daily. Take with or immediately following a meal. 135 tablet 3  . Multiple Vitamins-Iron (MULTIVITAMIN/IRON) TABS Take 1 tablet by mouth daily.    Marland Kitchen omeprazole (PRILOSEC) 20 MG capsule TAKE 1 CAPSULE TWICE A DAY BEFORE MEALS 180 capsule 3  . potassium chloride SA (KLOR-CON) 20 MEQ tablet TAKE 1 TABLET TWICE A DAY (KEEP UPCOMING APPOINTMENT IN SEPTEMBER WITH DR TURNER BEFORE ANYMORE REFILLS) 180 tablet 2  . Vitamin D, Ergocalciferol, (DRISDOL) 1.25 MG (50000 UNIT) CAPS capsule TAKE 1 CAPSULE EVERY WEDNESDAY AND SUNDAY 24 capsule 3   Current Facility-Administered Medications on File Prior to Visit  Medication Dose Route Frequency Provider Last Rate Last Admin  . denosumab (PROLIA) injection 60 mg  60 mg Subcutaneous Once Magrinat, Virgie Dad, MD        Allergies:   Allergies  Allergen Reactions  . Combigan [Brimonidine Tartrate-Timolol] Itching    Eyes itch, reddened  . Other Other (See Comments)    Per patient made OU red, Sore, and sensitivity to light  . Sulfa Antibiotics Rash  . Sulfamethoxazole-Trimethoprim Hives    Physical Exam General: Petite elderly Caucasian lady, seated, in no evident distress Head: head normocephalic and atraumatic.   Neck: supple with no carotid or supraclavicular bruits Cardiovascular: regular rate and rhythm, no murmurs Musculoskeletal: Mild kyphoscoliosis Skin:  no  rash/petichiae Vascular:  Normal pulses all extremities Vitals:   07/15/20 1248  BP: 104/73  Pulse: (!) 58   Neurologic Exam Mental Status: Awake and fully alert. Oriented to place and time. Recent and remote memory diminished. Attention span, concentration and fund of knowledge appropriate. Mood and affect appropriate.  Diminished recall 0/3.  Able to name only 6 animals which can walk on 4 legs.  Clock drawing 4/4. Cranial Nerves: Fundoscopic exam not done .Pupils equal, briskly reactive to light. Extraocular movements full without nystagmus. Visual fields full to confrontation. Hearing intact. Facial sensation intact. Face, tongue, palate moves normally and symmetrically.  Motor: Normal bulk and tone. Normal strength in all tested extremity muscles. Sensory.: intact to touch ,pinprick .position and vibratory sensation.  Coordination: Rapid alternating movements normal in all extremities. Finger-to-nose and heel-to-shin performed accurately bilaterally. Gait and Station: Arises from chair without difficulty. Stance is normal. Gait demonstrates normal stride length and balance . Able to heel, toe and tandem walk with moderate difficulty.  Reflexes: 1+ and symmetric. Toes downgoing.       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  Recent episode of brief loss of consciousness followed by confusion possibly unwitnessed seizure with postictal confusion in September 2020.  Also   mild cognitive impairment which appears stable.     PLAN: I had a long discussion with the patient and husband regarding episode of confusion, memory loss and mild cognitive impairment and answered questions. I recommend she  continue Prevagen 10 mg and  Cerefolin NAC 1 capsule daily to help with cognitive impairment as well as participate in mentally challenging activities like solving crossword puzzles, playing bridge and sudoku.    We also discussed memory compensation strategies.  She was advised to continue Keppra XR 500 mg daily for seizure prophylaxis and continue Eliquis for stroke prevention for atrial fibrillation and maintain aggressive risk factor modification.  Check follow-up screening carotid ultrasound study. She will return for follow-up for in person visit in 6 months or call earlier if necessary. Greater than 50% of time during this 25 minute visit was spent on counseling,explanation of diagnosis of mild cognitive impairment, planning of further management, discussion with patient and family and coordination of care Antony Contras, MD Note: This document was prepared with digital dictation and possible smart phrase technology. Any transcriptional errors that result from this process are unintentional

## 2020-07-25 ENCOUNTER — Telehealth: Payer: Self-pay | Admitting: Physician Assistant

## 2020-07-25 ENCOUNTER — Other Ambulatory Visit: Payer: Self-pay

## 2020-07-25 ENCOUNTER — Other Ambulatory Visit: Payer: Self-pay | Admitting: Cardiology

## 2020-07-25 DIAGNOSIS — M8580 Other specified disorders of bone density and structure, unspecified site: Secondary | ICD-10-CM

## 2020-07-25 DIAGNOSIS — E559 Vitamin D deficiency, unspecified: Secondary | ICD-10-CM

## 2020-07-25 NOTE — Telephone Encounter (Signed)
Recommend to schedule a lab visit to repeat Vitamin D and calcium levels (CMP) before approving medication refills to ensure Vitamin D is within an appropriate range.  Thank you, Herb Grays

## 2020-07-25 NOTE — Telephone Encounter (Signed)
Patient is requesting refills on Vitamin D, Calcium-Vitamin D, and Multiple Vitamins-Iron and uses [facility]. Thanks.

## 2020-07-25 NOTE — Telephone Encounter (Signed)
Patient scheduled for tomorrow

## 2020-07-26 ENCOUNTER — Other Ambulatory Visit: Payer: Medicare Other

## 2020-07-26 ENCOUNTER — Other Ambulatory Visit: Payer: Self-pay

## 2020-07-26 DIAGNOSIS — E559 Vitamin D deficiency, unspecified: Secondary | ICD-10-CM | POA: Diagnosis not present

## 2020-07-26 DIAGNOSIS — M8580 Other specified disorders of bone density and structure, unspecified site: Secondary | ICD-10-CM

## 2020-07-27 LAB — COMPREHENSIVE METABOLIC PANEL
ALT: 27 IU/L (ref 0–32)
AST: 26 IU/L (ref 0–40)
Albumin/Globulin Ratio: 1.7 (ref 1.2–2.2)
Albumin: 4.2 g/dL (ref 3.7–4.7)
Alkaline Phosphatase: 136 IU/L — ABNORMAL HIGH (ref 44–121)
BUN/Creatinine Ratio: 17 (ref 12–28)
BUN: 20 mg/dL (ref 8–27)
Bilirubin Total: 0.6 mg/dL (ref 0.0–1.2)
CO2: 23 mmol/L (ref 20–29)
Calcium: 9.2 mg/dL (ref 8.7–10.3)
Chloride: 102 mmol/L (ref 96–106)
Creatinine, Ser: 1.19 mg/dL — ABNORMAL HIGH (ref 0.57–1.00)
Globulin, Total: 2.5 g/dL (ref 1.5–4.5)
Glucose: 124 mg/dL — ABNORMAL HIGH (ref 65–99)
Potassium: 4.5 mmol/L (ref 3.5–5.2)
Sodium: 140 mmol/L (ref 134–144)
Total Protein: 6.7 g/dL (ref 6.0–8.5)
eGFR: 47 mL/min/{1.73_m2} — ABNORMAL LOW (ref >59)

## 2020-07-27 LAB — VITAMIN D 25 HYDROXY (VIT D DEFICIENCY, FRACTURES): Vit D, 25-Hydroxy: 90.7 ng/mL (ref 30.0–100.0)

## 2020-07-30 NOTE — Progress Notes (Signed)
Patient is aware of the results and verbalized understanding. AS, CMA

## 2020-08-25 ENCOUNTER — Other Ambulatory Visit: Payer: Self-pay | Admitting: Cardiology

## 2020-08-25 DIAGNOSIS — I5033 Acute on chronic diastolic (congestive) heart failure: Secondary | ICD-10-CM

## 2020-08-26 NOTE — Telephone Encounter (Signed)
Rx(s) sent to pharmacy electronically.  

## 2020-08-29 ENCOUNTER — Other Ambulatory Visit: Payer: Self-pay | Admitting: Physician Assistant

## 2020-08-29 ENCOUNTER — Telehealth: Payer: Self-pay | Admitting: Physician Assistant

## 2020-08-29 DIAGNOSIS — R053 Chronic cough: Secondary | ICD-10-CM

## 2020-08-29 MED ORDER — GABAPENTIN 400 MG PO CAPS: 400.0000 mg | ORAL_CAPSULE | Freq: Two times a day (BID) | ORAL | 0 refills | Status: DC

## 2020-08-29 NOTE — Telephone Encounter (Signed)
Patient's husband called into the office to let Herb Grays know that Patricia [name]'s cough has come back.  Maritza stopped the Gabapentin back in April due to [facility] not having a cough any longer.  She would like to go back on the Gabapentin.   Patricia [name] takes Calcium -Vitamin D (Oscal w/D) and she can no longer find it.  What vitamin can she take to substitute this?  Please advise!

## 2020-08-29 NOTE — Telephone Encounter (Signed)
Left msg for patient to call back. AS, CMA

## 2020-08-29 NOTE — Telephone Encounter (Signed)
Ok to resume original dose of Gabapentin. Will send in new rx. Recommend to take individual vitamins- Calcium 1200 mg and Vitamin D 2000 units daily.  Thank you, Herb Grays

## 2020-08-30 NOTE — Telephone Encounter (Signed)
Patient is aware of the below and verbalized understanding. AS, CMA 

## 2020-09-02 ENCOUNTER — Other Ambulatory Visit: Payer: Self-pay | Admitting: Cardiology

## 2020-09-03 ENCOUNTER — Other Ambulatory Visit: Payer: Self-pay | Admitting: Cardiology

## 2020-09-03 ENCOUNTER — Other Ambulatory Visit: Payer: Self-pay | Admitting: Neurology

## 2020-09-03 DIAGNOSIS — I5033 Acute on chronic diastolic (congestive) heart failure: Secondary | ICD-10-CM

## 2020-09-05 NOTE — Telephone Encounter (Signed)
Rx(s) sent to pharmacy electronically.  

## 2020-09-09 ENCOUNTER — Telehealth: Payer: Self-pay | Admitting: Neurology

## 2020-09-09 NOTE — Telephone Encounter (Signed)
Pt's husband(on DPR) has called to report that pt went off today and had great difficulty in finding her way home.  Husband is asking if Dr Leonie Man would make a change to pt's medication or if he would give the ok for pt to be seen before Nov.  Please call.

## 2020-09-10 ENCOUNTER — Encounter: Payer: Self-pay | Admitting: Oncology

## 2020-09-12 ENCOUNTER — Other Ambulatory Visit: Payer: Self-pay

## 2020-09-12 ENCOUNTER — Encounter: Payer: Self-pay | Admitting: Oncology

## 2020-09-12 ENCOUNTER — Ambulatory Visit (INDEPENDENT_AMBULATORY_CARE_PROVIDER_SITE_OTHER): Payer: Medicare Other | Admitting: Neurology

## 2020-09-12 VITALS — BP 132/90 | HR 120 | Ht 60.0 in | Wt 141.0 lb

## 2020-09-12 DIAGNOSIS — R410 Disorientation, unspecified: Secondary | ICD-10-CM | POA: Diagnosis not present

## 2020-09-12 DIAGNOSIS — G3184 Mild cognitive impairment, so stated: Secondary | ICD-10-CM

## 2020-09-12 NOTE — Patient Instructions (Signed)
I had a long discussion with the patient and her husband regarding her recent episodes of disorientation and confusion [facility] of unclear etiology but seems to have resolved.  She does have mild cognitive impairment on cognitive testing today she has not shown significant progression hence we will hold off on medication like Aricept or Namenda at the present time. ontinue Cerefolin NAC daily as well as Prevagen for her memory loss and increase participation in cognitively challenging activities like solving crossword puzzles, playing bridge and sodoku.  We also discussed memory compensation strategies. Continue Keppra for seizure prophylaxis and Eliquis for stroke prevention for atrial fibrillation and maintain aggressive risk factor modification strict control of hypertension blood pressure goal below 130/90, lipids with LDL cholesterol goal below 70 mg percent and diabetes with hemoglobin A1c goal below 6.5%.  C  She will return for follow-up in the future in 6 months or call earlier if necessary. Memory Compensation Strategies  Use "WARM" strategy.  W= write it down  A= associate it  R= repeat it  M= make a mental note  2.   You can keep a Social worker.  Use a 3-ring notebook with sections for the following: calendar, important names and phone numbers,  medications, doctors' names/phone numbers, lists/reminders, and a section to journal what you did  each day.   3.    Use a calendar to write appointments down.  4.    Write yourself a schedule for the day.  This can be placed on the calendar or in a separate section of the Memory Notebook.  Keeping a  regular schedule can help memory.  5.    Use medication organizer with sections for each day or morning/evening pills.  You may need help loading it  6.    Keep a basket, or pegboard by the door.  Place items that you need to take out with you in the basket or on the pegboard.  You may also want to  include a message board for reminders.  7.     Use sticky notes.  Place sticky notes with reminders in a place where the task is performed.  For example: " turn off the  stove" placed by the stove, "lock the door" placed on the door at eye level, " take your medications" on  the bathroom mirror or by the place where you normally take your medications.  8.    Use alarms/timers.  Use while cooking to remind yourself to check on food or as a reminder to take your medicine, or as a  reminder to make a call, or as a reminder to perform another task, etc.

## 2020-09-12 NOTE — Progress Notes (Signed)
[facility] [address] [place]. [place] [address] [phone]       OFFICE FOLLOW-UP NOTE  [name] Date of Birth:  26-Jun-1940 Medical Record Number:  [id number]   HPI: Initial visit 03/27/2019 Patricia [name] is a 80 year old pleasant Caucasian lady with past medical history of left breast cancer with radiation, persistent atrial fibrillation on Eliquis, multinodular goiter, laryngeal spasms, essential hypertension, chronic kidney disease, diastolic heart failure, and hyperlipidemia who was last seen in office on 11/19/2016.  She is seen today for follow-up following recent ER visit on November 30, 2018.  She was doing well until 2 days prior when the husband sent her to another room at home to get her checkbook.  The patient recalled trying to move a rolling chair out of her way and suddenly she fell backwards leaning on the wall and slid down to the floor.  The husband arrived a few minutes later and noted that she was unresponsive and was not speaking or answering his questions.  He called EMS and by the time they arrived patient was gradually returning back to normal.  She could not remember the episode and did not have a memory of the event.  She denied any chest pain, sweating, palpitations, headache, tongue bite injury or incontinence.  CT scan of the head was unremarkable and MRI scan showed no evidence of acute stroke or hemorrhage.  EEG showed bitemporal focal slowing.  Patient was on Eliquis for atrial fibrillation and prior stroke which was continued.  She states she is done well since then and has not had any further episodes of office consciousness or seizures.  The patient's husband however feels that is noticed that her memory is not the same and she has had some cognitive decline.  There have been no episodes of confusion, disorientation or agitation.  She has not had any delusions hallucinations.  She has not had any lab work to look for reversible causes of  cognitive impairment.  She denies any headache, falls or injuries.  She feels her balance may be slightly off. Update 06/22/2019 : She returns for follow-up after last visit 6 months ago. She is accompanied by her husband. She states that him memory difficulties are about unchanged. She is started taking Prevagen 10 mg 1 capsule daily and tolerating it well. She has had no further episodes of confusion or seizure-like episode and she remains on Keppra XR 500 mg daily which is tolerating well without side effects. She has had no recurrent stroke or TIAs either. She remains on Eliquis and is tolerating it well with minor bruising but no bleeding. Blood pressure is well controlled though it is elevated today in office. She has been participating in mentally challenging activities and she does have family history of Alzheimer's and worries about it and wants me to try something else to help . Update 01/04/2020: She returns for follow-up after last visit 6 months ago.  She is accompanied by her husband.  She states she is doing well.  Subjective memory difficulties appear about unchanged.  She remains on provision as well as she started taking Cerefolin NAC and is tolerating it well.  She has had no episodes of seizure confusion.  She is tolerating Keppra XR 500 mg without any side effects.  She has had no recurrent stroke or TIAs either.  She remains on Eliquis which is tolerating well without bruising or bleeding.  Her blood pressures well controlled and usually in the 213 systolic range  and today it is 117/74 in office.  She has no new complaints. Update 07/15/2020 : She returns for follow-up after last visit 6 months ago.  She is accompanied by her husband.  Patient states she is doing well and the memory difficulties are unchanged however the husband feels that the short-term memory is getting worse.  However she remains quite independent in activities of daily living.  She just does not remember to write things  down.  She still ending most of her own affairs.  She remains on Prevagen as well as Cerefolin NAC.  She plays bridge regularly but is not doing any other cognitively challenging activities.  She has had no recurrent stroke or TIA symptoms.  She has had no other episodes of loss of consciousness or seizures either.  She remains on Eliquis which is tolerating well with only minor bruising and no bleeding.  Blood pressure is well controlled.  Dr. Radford Pax her cardiologist increase the dose of metoprolol and blood pressure today is 104/73 and heart rate is 58.  She also remains on Keppra XR 5 mg daily which she is tolerating well without side effects.  She did have follow-up carotid ultrasound done on 01/31/2020 which showed no significant extracranial stenosis.  She has no new complaints. Update 09/12/2020 : She returns for follow-up after last visit with me 2 months ago.  This appointment was made when the husband called requesting she be seen sooner.  She had an episode a week ago when she was to drive to her dentist and her husband program the address and the GPS and the GPS took her through a different route and she appeared confused and lost and missed her appointment.  She came home an hour later and.  Slightly disoriented and frustrated but quickly returned back to baseline.  She denied any headache, tongue bite, seizure-like activity.  She continues to have mild short-term memory difficulties but does do crossword puzzles and mentally challenging activities.  She has had no recurrent TIA or strokelike episodes or seizure-like episodes.  She remains on Keppra which is tolerating well without side effects.  She is also on Eliquis tolerating it well without bruising or bleeding. 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 Dx 04/2015 at time of stroke. b. DCCV 06/2015 - did not hold. Amio started then stopped due to QT prolongation.   Chronic cough    Chronic diastolic heart failure ([place]) 06/17/2015   CKD (chronic kidney disease), stage III (HCC)    Cough variant asthma    Disease of pharynx or nasopharynx    Diverticulosis    Enlargement of right atrium 01/23/2020   Essential hypertension    GERD (gastroesophageal reflux disease)    Glaucoma    Hiatal hernia    Hyperlipidemia    Internal hemorrhoids    Laryngospasm    Mitral regurgitation    mild to moderate by echo 12/2019   Multinodular goiter    Osteopenia    Postmenopausal    Pulmonary HTN (HCC)     Moderate with PASP  10mmHg by echo 2018 likely Group 2 from pulmonary venous HTN from CHF>>normal PAP by echo 12/2019   Radiation 10/22/14-11/23/14   Left Breast   Stricture and stenosis of cervix     Social History:  Social History   Socioeconomic History   Marital status: Married    Spouse name: [name]   Number of children: Not on file   Years of education: Not on file   Highest education level: Not on file  Occupational History   Not on file  Tobacco Use   Smoking status: Former    Packs/day: 0.75    Years: 5.00    Pack years: 3.75    Types: Cigarettes    Quit date: 06/15/1966    Years since quitting: 54.2   Smokeless tobacco: Never  Vaping Use   Vaping Use: Never used  Substance and Sexual Activity   Alcohol use: Yes    Alcohol/week: 0.0 standard drinks    Comment: occasional wine   Drug use: No   Sexual activity: Not Currently  Other Topics Concern   Not on file  Social History Narrative   Retired. Lives with husband.    Right Handed   Drinks 1-2 cups caffeine daily   Social Determinants of Health   Financial Resource Strain: Not on file  Food  Insecurity: Not on file  Transportation Needs: Not on file  Physical Activity: Not on file  Stress: Not on file  Social Connections: Not on file  Intimate Partner Violence: Not on file    Medications:   Current Outpatient Medications on File Prior to Visit  Medication Sig Dispense Refill   albuterol (PROVENTIL HFA;VENTOLIN HFA) 108 (90 Base) MCG/ACT inhaler Inhale 2 puffs into the lungs every 6 (six) hours as needed for wheezing or shortness of breath.     Apoaequorin (PREVAGEN) 10 MG CAPS Take 1 capsule by mouth every morning. 1 capsule 1   ARMOUR THYROID 60 MG tablet TAKE 1 TABLET DAILY BEFORE BREAKFAST 90 tablet 1   atorvastatin (LIPITOR) 40 MG tablet TAKE 1 TABLET DAILY 90 tablet 3   calcium-vitamin D (OSCAL WITH D) 500-200 MG-UNIT tablet Take 1 tablet by mouth daily with breakfast. 30 tablet 0   diltiazem (CARDIZEM CD) 300 MG 24 hr capsule Take 1 capsule (300 mg total) by mouth daily. 90 capsule 3   ELIQUIS 5 MG TABS tablet TAKE 1 TABLET TWICE A DAY 180 tablet 1   fluticasone (FLONASE) 50 MCG/ACT nasal spray 1 spray each nostril following sinus rinses twice daily 16 g 2   fluticasone-salmeterol (ADVAIR HFA) 230-21 MCG/ACT inhaler USE 2 INHALATIONS TWICE A DAY 36 g 4   furosemide (LASIX) 20 MG tablet TAKE 2 TABLETS EVERY MORNING AND 1 TABLET EVERY EVENING 270 tablet 1   gabapentin (NEURONTIN) 400 MG capsule Take 1 capsule (400 mg total) by mouth 2 (two) times daily. 180 capsule 0   L-Methylfolate-B12-B6-B2 (CEREFOLIN) 07-31-48-5 MG TABS TAKE 1 TABLET BY MOUTH EVERY MORNING 90 tablet 3   levETIRAcetam (KEPPRA XR) 500 MG 24 hr tablet TAKE 1 TABLET DAILY 90 tablet 3   metoprolol succinate (TOPROL-XL) 50 MG 24 hr tablet Take 1.5 tablets (75 mg total) by mouth daily. Take with or immediately following a meal. 135 tablet 3   Multiple Vitamins-Iron (MULTIVITAMIN/IRON) TABS Take 1 tablet by mouth daily.     omeprazole (PRILOSEC) 20 MG capsule TAKE 1 CAPSULE TWICE A DAY BEFORE MEALS 180 capsule 3    potassium chloride SA (KLOR-CON) 20 MEQ tablet  TAKE 1 TABLET TWICE A DAY (KEEP UPCOMING APPOINTMENT IN SEPTEMBER WITH DR TURNER BEFORE ANYMORE REFILLS) 180 tablet 3   Vitamin D, Ergocalciferol, (DRISDOL) 1.25 MG (50000 UNIT) CAPS capsule TAKE 1 CAPSULE EVERY WEDNESDAY AND SUNDAY 24 capsule 3   Current Facility-Administered Medications on File Prior to Visit  Medication Dose Route Frequency Provider Last Rate Last Admin   denosumab (PROLIA) injection 60 mg  60 mg Subcutaneous Once Magrinat, Virgie Dad, MD        Allergies:   Allergies  Allergen Reactions   Combigan [Brimonidine Tartrate-Timolol] Itching    Eyes itch, reddened   Other Other (See Comments)    Per patient made OU red, Sore, and sensitivity to light   Sulfa Antibiotics Rash   Sulfamethoxazole-Trimethoprim Hives    Physical Exam General: Petite elderly Caucasian lady, seated, in no evident distress Head: head normocephalic and atraumatic.  Neck: supple with no carotid or supraclavicular bruits Cardiovascular: regular rate and rhythm, no murmurs Musculoskeletal: Mild kyphoscoliosis Skin:  no rash/petichiae Vascular:  Normal pulses all extremities Vitals:   09/12/20 1501  BP: 132/90  Pulse: (!) 120   Neurologic Exam Mental Status: Awake and fully alert. Oriented to place and time. Recent and remote memory diminished. Attention span, concentration and fund of knowledge appropriate. Mood and affect appropriate.  Diminished recall 2/3.  Able to name 12 animals which can walk on 4 legs.  Clock drawing 4/4.  Mini-Mental status exam score 27/30 with deficits in orientation and recall only. Cranial Nerves: Fundoscopic exam not done .Pupils equal, briskly reactive to light. Extraocular movements full without nystagmus. Visual fields full to confrontation. Hearing intact. Facial sensation intact. Face, tongue, palate moves normally and symmetrically.  Motor: Normal bulk and tone. Normal strength in all tested extremity  muscles. Sensory.: intact to touch ,pinprick .position and vibratory sensation.  Coordination: Rapid alternating movements normal in all extremities. Finger-to-nose and heel-to-shin performed accurately bilaterally. Gait and Station: Arises from chair without difficulty. Stance is normal. Gait demonstrates normal stride length and balance . Able to heel, toe and tandem walk with moderate difficulty.  Reflexes: 1+ and symmetric. Toes downgoing.   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  Recent episode of brief loss of consciousness followed by confusion possibly unwitnessed seizure with postictal confusion in September 2020.  Also   mild cognitive impairment which appears stable. Recent episode of transient confusion and disorientation likely related to her baseline mild cognitive impairment doubt seizure    PLAN: I had a long discussion with the patient and husband regarding episode of confusion, memory loss and mild cognitive impairment and answered questions.  I recommend she continue Prevagen 10 mg and  Cerefolin NAC 1 capsule daily to help with cognitive impairment as well as participate in mentally challenging activities like solving crossword puzzles, playing bridge and sudoku.  We also discussed memory compensation strategies.  She was advised to continue Keppra XR 500 mg daily for seizure prophylaxis and continue Eliquis for stroke prevention for atrial  fibrillation and maintain aggressive risk factor modification.  Check follow-up screening carotid ultrasound study. She will return for follow-up for in person visit in 6 months or call earlier if necessary. Greater than 50% of time during this 25 minute visit was spent on counseling,explanation of diagnosis of mild cognitive impairment, planning of further management, discussion with patient and family and coordination of care Antony Contras, MD Note: This document was prepared with digital dictation and possible smart phrase technology. Any transcriptional errors that result from this process are unintentional

## 2020-09-20 DIAGNOSIS — H401133 Primary open-angle glaucoma, bilateral, severe stage: Secondary | ICD-10-CM | POA: Diagnosis not present

## 2020-09-20 DIAGNOSIS — H04123 Dry eye syndrome of bilateral lacrimal glands: Secondary | ICD-10-CM | POA: Diagnosis not present

## 2020-09-20 DIAGNOSIS — H524 Presbyopia: Secondary | ICD-10-CM | POA: Diagnosis not present

## 2020-09-20 DIAGNOSIS — H2511 Age-related nuclear cataract, right eye: Secondary | ICD-10-CM | POA: Diagnosis not present

## 2020-09-23 ENCOUNTER — Encounter: Payer: Self-pay | Admitting: Oncology

## 2020-09-30 ENCOUNTER — Other Ambulatory Visit: Payer: Self-pay

## 2020-09-30 ENCOUNTER — Other Ambulatory Visit: Payer: Medicare Other

## 2020-09-30 DIAGNOSIS — R7303 Prediabetes: Secondary | ICD-10-CM | POA: Diagnosis not present

## 2020-09-30 DIAGNOSIS — N183 Chronic kidney disease, stage 3 unspecified: Secondary | ICD-10-CM

## 2020-09-30 DIAGNOSIS — I10 Essential (primary) hypertension: Secondary | ICD-10-CM | POA: Diagnosis not present

## 2020-09-30 DIAGNOSIS — E032 Hypothyroidism due to medicaments and other exogenous substances: Secondary | ICD-10-CM

## 2020-09-30 DIAGNOSIS — E785 Hyperlipidemia, unspecified: Secondary | ICD-10-CM | POA: Diagnosis not present

## 2020-10-01 LAB — LIPID PANEL
Chol/HDL Ratio: 2.2 ratio (ref 0.0–4.4)
Cholesterol, Total: 134 mg/dL (ref 100–199)
HDL: 61 mg/dL (ref >39)
LDL Chol Calc (NIH): 62 mg/dL (ref 0–99)
Triglycerides: 49 mg/dL (ref 0–149)
VLDL Cholesterol Cal: 11 mg/dL (ref 5–40)

## 2020-10-01 LAB — HEPATIC FUNCTION PANEL: Bilirubin, Direct: 0.18 mg/dL (ref 0.00–0.40)

## 2020-10-01 LAB — CBC WITH DIFFERENTIAL/PLATELET
Basophils Absolute: 0 10*3/uL (ref 0.0–0.2)
Basos: 1 %
EOS (ABSOLUTE): 0 10*3/uL (ref 0.0–0.4)
Eos: 0 %
Hematocrit: 44.4 % (ref 34.0–46.6)
Hemoglobin: 15.1 g/dL (ref 11.1–15.9)
Immature Grans (Abs): 0 10*3/uL (ref 0.0–0.1)
Immature Granulocytes: 1 %
Lymphocytes Absolute: 1.5 10*3/uL (ref 0.7–3.1)
Lymphs: 22 %
MCH: 32.2 pg (ref 26.6–33.0)
MCHC: 34 g/dL (ref 31.5–35.7)
MCV: 95 fL (ref 79–97)
Monocytes Absolute: 0.5 10*3/uL (ref 0.1–0.9)
Monocytes: 8 %
Neutrophils Absolute: 4.8 10*3/uL (ref 1.4–7.0)
Neutrophils: 68 %
Platelets: 267 10*3/uL (ref 150–450)
RBC: 4.69 x10E6/uL (ref 3.77–5.28)
RDW: 13.4 % (ref 11.7–15.4)
WBC: 6.9 10*3/uL (ref 3.4–10.8)

## 2020-10-01 LAB — COMPREHENSIVE METABOLIC PANEL
ALT: 20 IU/L (ref 0–32)
AST: 16 IU/L (ref 0–40)
Albumin/Globulin Ratio: 1.6 (ref 1.2–2.2)
Albumin: 4 g/dL (ref 3.7–4.7)
Alkaline Phosphatase: 120 IU/L (ref 44–121)
BUN/Creatinine Ratio: 21 (ref 12–28)
BUN: 23 mg/dL (ref 8–27)
Bilirubin Total: 0.4 mg/dL (ref 0.0–1.2)
CO2: 25 mmol/L (ref 20–29)
Calcium: 8.9 mg/dL (ref 8.7–10.3)
Chloride: 104 mmol/L (ref 96–106)
Creatinine, Ser: 1.09 mg/dL — ABNORMAL HIGH (ref 0.57–1.00)
Globulin, Total: 2.5 g/dL (ref 1.5–4.5)
Glucose: 104 mg/dL — ABNORMAL HIGH (ref 65–99)
Potassium: 4.3 mmol/L (ref 3.5–5.2)
Sodium: 142 mmol/L (ref 134–144)
Total Protein: 6.5 g/dL (ref 6.0–8.5)
eGFR: 52 mL/min/{1.73_m2} — ABNORMAL LOW (ref >59)

## 2020-10-01 LAB — HEMOGLOBIN A1C
Est. average glucose Bld gHb Est-mCnc: 128 mg/dL
Hgb A1c MFr Bld: 6.1 % — ABNORMAL HIGH (ref 4.8–5.6)

## 2020-10-01 LAB — TSH: TSH: 4.94 u[IU]/mL — ABNORMAL HIGH (ref 0.450–4.500)

## 2020-10-03 ENCOUNTER — Other Ambulatory Visit: Payer: Self-pay

## 2020-10-03 ENCOUNTER — Ambulatory Visit (INDEPENDENT_AMBULATORY_CARE_PROVIDER_SITE_OTHER): Payer: Medicare Other | Admitting: Physician Assistant

## 2020-10-03 ENCOUNTER — Encounter: Payer: Self-pay | Admitting: Physician Assistant

## 2020-10-03 VITALS — BP 95/66 | HR 83 | Temp 98.3°F | Ht 60.0 in | Wt 141.5 lb

## 2020-10-03 DIAGNOSIS — Z0000 Encounter for general adult medical examination without abnormal findings: Secondary | ICD-10-CM | POA: Diagnosis not present

## 2020-10-03 DIAGNOSIS — I10 Essential (primary) hypertension: Secondary | ICD-10-CM | POA: Diagnosis not present

## 2020-10-03 NOTE — Patient Instructions (Signed)
Preventive Care 80 Years and Older, Female Preventive care refers to lifestyle choices and visits with your health care provider that can promote health and wellness. This includes: A yearly physical exam. This is also called an annual wellness visit. Regular dental and eye exams. Immunizations. Screening for certain conditions. Healthy lifestyle choices, such as: Eating a healthy diet. Getting regular exercise. Not using drugs or products that contain nicotine and tobacco. Limiting alcohol use. What can I expect for my preventive care visit? Physical exam Your health care provider will check your: Height and weight. These may be used to calculate your BMI (body mass index). BMI is a measurement that tells if you are at a healthy weight. Heart rate and blood pressure. Body temperature. Skin for abnormal spots. Counseling Your health care provider may ask you questions about your: Past medical problems. Family's medical history. Alcohol, tobacco, and drug use. Emotional well-being. Home life and relationship well-being. Sexual activity. Diet, exercise, and sleep habits. History of falls. Memory and ability to understand (cognition). Work and work Statistician. Pregnancy and menstrual history. Access to firearms. What immunizations do I need?  Vaccines are usually given at various ages, according to a schedule. Your health care provider will recommend vaccines for you based on your age, medicalhistory, and lifestyle or other factors, such as travel or where you work. What tests do I need? Blood tests Lipid and cholesterol levels. These may be checked every 5 years, or more often depending on your overall health. Hepatitis C test. Hepatitis B test. Screening Lung cancer screening. You may have this screening every year starting at age 44 if you have a 30-pack-year history of smoking and currently smoke or have quit within the past 15 years. Colorectal cancer screening. All  adults should have this screening starting at age 39 and continuing until age 65. Your health care provider may recommend screening at age 61 if you are at increased risk. You will have tests every 1-10 years, depending on your results and the type of screening test. Diabetes screening. This is done by checking your blood sugar (glucose) after you have not eaten for a while (fasting). You may have this done every 1-3 years. Mammogram. This may be done every 1-2 years. Talk with your health care provider about how often you should have regular mammograms. Abdominal aortic aneurysm (AAA) screening. You may need this if you are a current or former smoker. BRCA-related cancer screening. This may be done if you have a family history of breast, ovarian, tubal, or peritoneal cancers. Other tests STD (sexually transmitted disease) testing, if you are at risk. Bone density scan. This is done to screen for osteoporosis. You may have this done starting at age 54. Talk with your health care provider about your test results, treatment options,and if necessary, the need for more tests. Follow these instructions at home: Eating and drinking  Eat a diet that includes fresh fruits and vegetables, whole grains, lean protein, and low-fat dairy products. Limit your intake of foods with high amounts of sugar, saturated fats, and salt. Take vitamin and mineral supplements as recommended by your health care provider. Do not drink alcohol if your health care provider tells you not to drink. If you drink alcohol: Limit how much you have to 0-1 drink a day. Be aware of how much alcohol is in your drink. In the [place] one drink equals one 12 oz bottle of beer (355 mL), one 5 oz glass of wine (148 mL), or one 1  oz glass of hard liquor (44 mL).  Lifestyle Take daily care of your teeth and gums. Brush your teeth every morning and night with fluoride toothpaste. Floss one time each day. Stay active. Exercise for at  least 30 minutes 5 or more days each week. Do not use any products that contain nicotine or tobacco, such as cigarettes, e-cigarettes, and chewing tobacco. If you need help quitting, ask your health care provider. Do not use drugs. If you are sexually active, practice safe sex. Use a condom or other form of protection in order to prevent STIs (sexually transmitted infections). Talk with your health care provider about taking a low-dose aspirin or statin. Find healthy ways to cope with stress, such as: Meditation, yoga, or listening to music. Journaling. Talking to a trusted person. Spending time with friends and family. Safety Always wear your seat belt while driving or riding in a vehicle. Do not drive: If you have been drinking alcohol. Do not ride with someone who has been drinking. When you are tired or distracted. While texting. Wear a helmet and other protective equipment during sports activities. If you have firearms in your house, make sure you follow all gun safety procedures. What's next? Visit your health care provider once a year for an annual wellness visit. Ask your health care provider how often you should have your eyes and teeth checked. Stay up to date on all vaccines. This information is not intended to replace advice given to you by your health care provider. Make sure you discuss any questions you have with your healthcare provider. Document Revised: 02/07/2020 Document Reviewed: 02/10/2018 Elsevier Patient Education  2022 [employer].

## 2020-10-03 NOTE — Progress Notes (Signed)
Subjective:   Patricia [name] is a 80 y.o. female who presents for Medicare Annual (Subsequent) preventive examination.  Review of Systems    General:   No F/C, wt loss Pulm:   No DIB, SOB, pleuritic chest pain Card:  No CP, palpitations Abd:  No n/v/d or pain Ext:  No inc edema from baseline  Objective:    Today's Vitals   10/03/20 1033  BP: 95/66  Pulse: 83  Temp: 98.3 F (36.8 C)  SpO2: 96%  Weight: 141 lb 8 oz (64.2 kg)  Height: 5' (1.524 m)   Body mass index is 27.63 kg/m.  Advanced Directives 03/24/2020 01/23/2020 11/30/2018 09/08/2016 05/19/2016 04/27/2016 09/18/2015  Does Patient Have a Medical Advance Directive? Yes Yes Yes Yes Yes Yes Yes  Type of Advance Directive Living will [place];Living will [employer];Living will [place];Living will;Out of facility DNR (pink MOST or yellow form) [place];Living will Living will -  Does patient want to make changes to medical advance directive? No - Guardian declined No - Patient declined - - - No - Patient declined -  Copy of [place] in Chart? - No - copy requested No - copy requested No - copy requested - - -  Would patient like information on creating a medical advance directive? - - No - Patient declined - - - -    Current Medications (verified) Outpatient Encounter Medications as of 10/03/2020  Medication Sig   albuterol (PROVENTIL HFA;VENTOLIN HFA) 108 (90 Base) MCG/ACT inhaler Inhale 2 puffs into the lungs every 6 (six) hours as needed for wheezing or shortness of breath.   Apoaequorin (PREVAGEN) 10 MG CAPS Take 1 capsule by mouth every morning.   ARMOUR THYROID 60 MG tablet TAKE 1 TABLET DAILY BEFORE BREAKFAST   atorvastatin (LIPITOR) 40 MG tablet TAKE 1 TABLET DAILY   calcium-vitamin D (OSCAL WITH D) 500-200 MG-UNIT tablet Take 1 tablet by mouth daily with breakfast.   diltiazem (CARDIZEM CD) 300 MG 24 hr capsule Take 1 capsule  (300 mg total) by mouth daily.   ELIQUIS 5 MG TABS tablet TAKE 1 TABLET TWICE A DAY   fluticasone (FLONASE) 50 MCG/ACT nasal spray 1 spray each nostril following sinus rinses twice daily   fluticasone-salmeterol (ADVAIR HFA) 230-21 MCG/ACT inhaler USE 2 INHALATIONS TWICE A DAY   furosemide (LASIX) 20 MG tablet TAKE 2 TABLETS EVERY MORNING AND 1 TABLET EVERY EVENING   gabapentin (NEURONTIN) 400 MG capsule Take 1 capsule (400 mg total) by mouth 2 (two) times daily.   L-Methylfolate-B12-B6-B2 (CEREFOLIN) 07-31-48-5 MG TABS TAKE 1 TABLET BY MOUTH EVERY MORNING   levETIRAcetam (KEPPRA XR) 500 MG 24 hr tablet TAKE 1 TABLET DAILY   metoprolol succinate (TOPROL-XL) 50 MG 24 hr tablet Take 1.5 tablets (75 mg total) by mouth daily. Take with or immediately following a meal.   Multiple Vitamins-Iron (MULTIVITAMIN/IRON) TABS Take 1 tablet by mouth daily.   omeprazole (PRILOSEC) 20 MG capsule TAKE 1 CAPSULE TWICE A DAY BEFORE MEALS   potassium chloride SA (KLOR-CON) 20 MEQ tablet TAKE 1 TABLET TWICE A DAY (KEEP UPCOMING APPOINTMENT IN SEPTEMBER WITH DR TURNER BEFORE ANYMORE REFILLS)   Vitamin D, Ergocalciferol, (DRISDOL) 1.25 MG (50000 UNIT) CAPS capsule TAKE 1 CAPSULE EVERY WEDNESDAY AND SUNDAY   Facility-Administered Encounter Medications as of 10/03/2020  Medication   denosumab (PROLIA) injection 60 mg    Allergies (verified) Combigan [brimonidine tartrate-timolol], Other, Sulfa antibiotics, and Sulfamethoxazole-trimethoprim  History: Past Medical History:  Diagnosis Date   Allergic rhinitis, cause unspecified    Aortic regurgitation 06/17/2015   mild by echo 2021   Arthritis    in my fingers   Cancer ([place])    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 Dx 04/2015 at time of stroke. b. DCCV 06/2015 - did not hold. Amio started then stopped due to QT prolongation.   Chronic cough    Chronic diastolic heart failure ([place]) 06/17/2015   CKD (chronic kidney  disease), stage III (HCC)    Cough variant asthma    Disease of pharynx or nasopharynx    Diverticulosis    Enlargement of right atrium 01/23/2020   Essential hypertension    GERD (gastroesophageal reflux disease)    Glaucoma    Hiatal hernia    Hyperlipidemia    Internal hemorrhoids    Laryngospasm    Mitral regurgitation    mild to moderate by echo 12/2019   Multinodular goiter    Osteopenia    Postmenopausal    Pulmonary HTN (HCC)     Moderate with PASP 41mmHg by echo 2018 likely Group 2 from pulmonary venous HTN from CHF>>normal PAP by echo 12/2019   Radiation 10/22/14-11/23/14   Left Breast   Stricture and stenosis of cervix    Past Surgical History:  Procedure Laterality Date   BREAST LUMPECTOMY Left    BREAST SURGERY     CARDIOVERSION N/A 06/24/2015   Procedure: CARDIOVERSION;  Surgeon: Sueanne Margarita, MD;  Location: [facility];  Service: Cardiovascular;  Laterality: N/A;   CARDIOVERSION N/A 05/19/2016   Procedure: CARDIOVERSION;  Surgeon: Sueanne Margarita, MD;  Location: [facility];  Service: Cardiovascular;  Laterality: N/A;   COLONOSCOPY     LUNG CANCER SURGERY  2003   resection carcinoid lingula-lt upper lobe   RADIOACTIVE SEED GUIDED PARTIAL MASTECTOMY WITH AXILLARY SENTINEL LYMPH NODE BIOPSY Left 06/26/2014   Procedure: RADIOACTIVE SEED GUIDED PARTIAL MASTECTOMY WITH AXILLARY SENTINEL LYMPH NODE BIOPSY;  Surgeon: Stark Klein, MD;  Location: [facility];  Service: General;  Laterality: Left;   RE-EXCISION OF BREAST LUMPECTOMY Left 08/07/2014   Procedure: RE-EXCISION OF LEFT BREAST LUMPECTOMY;  Surgeon: Stark Klein, MD;  Location: [facility];  Service: General;  Laterality: Left;   RIGHT HEART CATH N/A 04/27/2016   Procedure: Right Heart Cath;  Surgeon: Larey Dresser, MD;  Location: [facility];  Service: Cardiovascular;  Laterality: N/A;   Family History  Problem Relation Age of Onset   Stroke Mother    Hypertension Mother     Hyperlipidemia Mother    Stroke Father    Transient ischemic attack Father    Hyperlipidemia Father    Hypertension Father    Atrial fibrillation Son    Heart attack Neg Hx    Social History   Socioeconomic History   Marital status: Married    Spouse name: [name]   Number of children: Not on file   Years of education: Not on file   Highest education level: Not on file  Occupational History   Not on file  Tobacco Use   Smoking status: Former    Packs/day: 0.75    Years: 5.00    Pack years: 3.75    Types: Cigarettes    Quit date: 06/15/1966    Years since quitting: 54.3   Smokeless tobacco: Never  Vaping Use   Vaping Use: Never  used  Substance and Sexual Activity   Alcohol use: Yes    Alcohol/week: 0.0 standard drinks    Comment: occasional wine   Drug use: No   Sexual activity: Not Currently  Other Topics Concern   Not on file  Social History Narrative   Retired. Lives with husband.    Right Handed   Drinks 1-2 cups caffeine daily   Social Determinants of Health   Financial Resource Strain: Not on file  Food Insecurity: Not on file  Transportation Needs: Not on file  Physical Activity: Not on file  Stress: Not on file  Social Connections: Not on file    Tobacco Counseling Counseling given: Not Answered   Diabetic?no         Activities of Daily Living In your present state of health, do you have any difficulty performing the following activities: 10/03/2020 06/03/2020  Hearing? N N  Vision? N N  Difficulty concentrating or making decisions? N N  Walking or climbing stairs? N N  Dressing or bathing? N N  Doing errands, shopping? N N  Some recent data might be hidden    Patient Care Team: Lorrene Reid, PA-C as PCP - General Sueanne Margarita, MD as PCP - Cardiology (Cardiology) Constance Haw, MD as PCP - Electrophysiology (Cardiology) Thea Silversmith, MD as Consulting Physician (Radiation Oncology) Magrinat, Virgie Dad, MD as Consulting  Physician (Oncology) Stark Klein, MD as Consulting Physician (General Surgery) Ladene Artist, MD as Consulting Physician (Gastroenterology) Marygrace Drought, MD as Consulting Physician (Ophthalmology) Garvin Fila, MD as Consulting Physician (Neurology)  Indicate any recent Medical Services you may have received from other than [facility] providers in the past year (date may be approximate).     Assessment:   This is a routine wellness examination for [facility]  Hearing/Vision screen No results found.  Dietary issues and exercise activities discussed: -Continue with playing golf and walking. Recommend to stay well hydrated especially when outside working or playing golf. Continue with heart healthy diet.   Goals Addressed   None   Depression Screen PHQ 2/9 Scores 10/03/2020 06/03/2020 09/28/2019 03/06/2019 12/05/2018 08/15/2018 04/18/2018  PHQ - 2 Score 0 0 0 0 1 0 0  PHQ- 9 Score 0 0 0 0 9 0 0    Fall Risk Fall Risk  10/03/2020 06/03/2020 09/28/2019 12/22/2018 12/05/2018  Falls in the past year? 0 0 0 0 0  Number falls in past yr: 0 - - - -  Injury with Fall? 0 - - - -  Risk for fall due to : No Fall Risks - - - -  Follow up Falls evaluation completed Falls evaluation completed Falls evaluation completed - -    FALL RISK PREVENTION PERTAINING TO THE HOME:  Any stairs in or around the home? Yes  If so, are there any without handrails? No  Home free of loose throw rugs in walkways, pet beds, electrical cords, etc? Yes  Adequate lighting in your home to reduce risk of falls? Yes   ASSISTIVE DEVICES UTILIZED TO PREVENT FALLS:  Life alert? No  Use of a cane, walker or w/c? No  Grab bars in the bathroom? No  Shower chair or bench in shower? Yes  Elevated toilet seat or a handicapped toilet? Yes   TIMED UP AND GO:  Was the test performed? Yes .  Length of time to ambulate 10 feet: 14 sec.   Gait slow and steady without use of assistive device  Cognitive Function: stable, followed  by  Neurology  MMSE - Mini Mental State Exam 09/12/2020 09/27/2017 03/08/2017 11/19/2016 05/14/2016  Orientation to time 3 4 5 3 3   Orientation to Place 5 5 5 5 5   Registration 3 3 3 3 3   Attention/ Calculation 5 5 5 5 3   Recall 2 3 3 3 2   Language- name 2 objects 2 2 2 2 2   Language- repeat 1 1 1 1 1   Language- follow 3 step command 3 3 3 3 3   Language- read & follow direction 1 1 1 1 1   Write a sentence 1 1 1 1 1   Copy design 1 1 1 1 1   Total score 27 29 30 28 25      6CIT Screen 10/03/2020 09/28/2019 12/05/2018 03/08/2017  What Year? 0 points 0 points 0 points 0 points  What month? 3 points 0 points 0 points 0 points  What time? 3 points 0 points 0 points 0 points  Count back from 20 0 points 0 points 0 points 0 points  Months in reverse 0 points 0 points 0 points 0 points  Repeat phrase 6 points 0 points 0 points 2 points  Total Score 12 0 0 2    Immunizations Immunization History  Administered Date(s) Administered   Hepatitis A 10/01/2015   Hepatitis A, Adult 12/29/2012   Influenza Split 12/11/2010, 12/01/2011   Influenza Whole 01/21/2007, 01/01/2009, 11/28/2009   Influenza, High Dose Seasonal PF 12/01/2013, 12/14/2014, 12/02/2015, 12/18/2016, 12/14/2017   Influenza,inj,Quad PF,6+ Mos 12/29/2012, 12/01/2013   Influenza-Unspecified 12/18/2016, 12/14/2019   PFIZER(Purple Top)SARS-COV-2 Vaccination 03/14/2019, 04/03/2019, 11/27/2019   Pneumococcal Conjugate-13 02/08/2014   Pneumococcal Polysaccharide-23 03/02/2005, 01/26/2011   Td 03/02/2000, 12/29/2012   Typhoid Inactivated 10/01/2015   Yellow Fever 10/01/2015   Zoster, Live 05/03/2006    TDAP status: Up to date  Flu Vaccine status: Up to date  Pneumococcal vaccine status: Up to date  Covid-19 vaccine status: Completed vaccines  Qualifies for Shingles Vaccine? Yes   Zostavax completed Yes   Shingrix Completed?: No.    Education has been provided regarding the importance of this vaccine. Patient has been advised to call  insurance company to determine out of pocket expense if they have not yet received this vaccine. Advised may also receive vaccine at local pharmacy or Health Dept. Verbalized acceptance and understanding.  Screening Tests Health Maintenance  Topic Date Due   Hepatitis C Screening  Never done   Zoster Vaccines- Shingrix (1 of 2) Never done   COVID-19 Vaccine (4 - Booster for Pfizer series) 02/26/2020   INFLUENZA VACCINE  09/30/2020   MAMMOGRAM  06/25/2021   TETANUS/TDAP  12/30/2022   DEXA SCAN  Completed   PNA vac Low Risk Adult  Completed   HPV VACCINES  Aged Out    Health Maintenance  Health Maintenance Due  Topic Date Due   Hepatitis C Screening  Never done   Zoster Vaccines- Shingrix (1 of 2) Never done   COVID-19 Vaccine (4 - Booster for Pfizer series) 02/26/2020   INFLUENZA VACCINE  09/30/2020    Colorectal cancer screening: Type of screening: Colonoscopy. Completed 03/17/2007. Repeat every 10 years  Mammogram status: Completed 06/25/2020. Repeat every year  Bone Density status: Completed 12/15/2019. Results reflect: Bone density results: OSTEOPOROSIS. Repeat every 2 years.  Lung Cancer Screening: (Low Dose CT Chest recommended if Age 61-80 years, 30 pack-year currently smoking OR have quit w/in 15years.) does not qualify.   Lung Cancer Screening Referral: n/a  Additional Screening:  Hepatitis C  Screening: does qualify; Completed pt declined   Vision Screening: Recommended annual ophthalmology exams for early detection of glaucoma and other disorders of the eye. Is the patient up to date with their annual eye exam?  Yes  Who is the provider or what is the name of the office in which the patient attends annual eye exams? Dr. Satira Sark If pt is not established with a provider, would they like to be referred to a provider to establish care? No .   Dental Screening: Recommended annual dental exams for proper oral hygiene  Community Resource Referral / Chronic Care  Management: CRR required this visit?  No   CCM required this visit?  No      Plan:  -Discussed most recent labs which are essentially within normal limits or stable from prior.  Kidney function and A1c have improved TSH is mildly elevated.  Will repeat thyroid labs (TSH, free T4, T3) in 6 weeks for medication adjustments. -Continue to follow-up with various specialists. -Continue to engage in cognitive stimulating activities. -Follow up in 6 months for HTN, HLD, thyroid   I have personally reviewed and noted the following in the patient's chart:   Medical and social history Use of alcohol, tobacco or illicit drugs  Current medications and supplements including opioid prescriptions.  Functional ability and status Nutritional status Physical activity Advanced directives List of other physicians Hospitalizations, surgeries, and ER visits in previous 12 months Vitals Screenings to include cognitive, depression, and falls Referrals and appointments  In addition, I have reviewed and discussed with patient certain preventive protocols, quality metrics, and best practice recommendations. A written personalized care plan for preventive services as well as general preventive health recommendations were provided to patient.     Lorrene Reid, PA-C   10/03/2020

## 2020-10-10 DIAGNOSIS — H25811 Combined forms of age-related cataract, right eye: Secondary | ICD-10-CM | POA: Diagnosis not present

## 2020-10-10 DIAGNOSIS — H25041 Posterior subcapsular polar age-related cataract, right eye: Secondary | ICD-10-CM | POA: Diagnosis not present

## 2020-10-10 DIAGNOSIS — H25011 Cortical age-related cataract, right eye: Secondary | ICD-10-CM | POA: Diagnosis not present

## 2020-10-10 DIAGNOSIS — H2511 Age-related nuclear cataract, right eye: Secondary | ICD-10-CM | POA: Diagnosis not present

## 2020-10-15 ENCOUNTER — Ambulatory Visit (INDEPENDENT_AMBULATORY_CARE_PROVIDER_SITE_OTHER): Payer: Medicare Other | Admitting: Nurse Practitioner

## 2020-10-15 ENCOUNTER — Encounter: Payer: Self-pay | Admitting: Nurse Practitioner

## 2020-10-15 ENCOUNTER — Other Ambulatory Visit: Payer: Self-pay

## 2020-10-15 VITALS — BP 102/68 | HR 93 | Temp 97.0°F | Ht 60.0 in | Wt 142.3 lb

## 2020-10-15 DIAGNOSIS — I10 Essential (primary) hypertension: Secondary | ICD-10-CM

## 2020-10-15 DIAGNOSIS — K219 Gastro-esophageal reflux disease without esophagitis: Secondary | ICD-10-CM | POA: Diagnosis not present

## 2020-10-15 DIAGNOSIS — J45991 Cough variant asthma: Secondary | ICD-10-CM | POA: Diagnosis not present

## 2020-10-15 MED ORDER — PANTOPRAZOLE SODIUM 40 MG PO TBEC: 40.0000 mg | DELAYED_RELEASE_TABLET | Freq: Every day | ORAL | 3 refills | Status: DC

## 2020-10-15 NOTE — Progress Notes (Signed)
Established Patient Office Visit  Subjective:  Patient ID: Patricia [name], female    DOB: 09-21-1940  Age: 80 y.o. MRN: [id number]  CC:  Chief Complaint  Patient presents with   Cough    HPI Patricia [name] presents for evaluation of chronic cough.  This has been going on for several weeks.  She denies shortness of breath.  Has noticed some mild wheezing.  She denies chest pain or chest pressure.  She denies unusual headache.  She states that drinking warm water times does stop the cough.  She states that cold water seems to make it worse.  In the past, she has had removal of her thyroid.  She is on Armour Thyroid which does control function of her thyroid.  States that she was a smoker.  She quit many years ago.  Blood pressure has been well managed.  She is currently not taking medications that have cough is a common side effect.  She does take omeprazole for acid reflux.  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 Dx 04/2015 at time of stroke. b. DCCV 06/2015 - did not hold. Amio started then stopped due to QT prolongation.   Chronic cough    Chronic diastolic heart failure ([place]) 06/17/2015   CKD (chronic kidney disease), stage III (HCC)    Cough variant asthma    Disease of pharynx or nasopharynx    Diverticulosis    Enlargement of right atrium 01/23/2020   Essential hypertension    GERD (gastroesophageal reflux disease)    Glaucoma    Hiatal hernia    Hyperlipidemia    Internal hemorrhoids    Laryngospasm    Mitral regurgitation    mild to moderate by echo 12/2019   Multinodular goiter    Osteopenia    Postmenopausal    Pulmonary HTN (HCC)     Moderate with PASP 60mHg by echo 2018 likely Group 2 from pulmonary venous HTN from CHF>>normal PAP by echo 12/2019    Radiation 10/22/14-11/23/14   Left Breast   Stricture and stenosis of cervix     Past Surgical History:  Procedure Laterality Date   BREAST LUMPECTOMY Left    BREAST SURGERY     CARDIOVERSION N/A 06/24/2015   Procedure: CARDIOVERSION;  Surgeon: TSueanne Margarita MD;  [facility]  Service: Cardiovascular;  Laterality: N/A;   CARDIOVERSION N/A 05/19/2016   Procedure: CARDIOVERSION;  Surgeon: TSueanne Margarita MD;  [facility];  Service: Cardiovascular;  Laterality: N/A;   COLONOSCOPY     LUNG CANCER SURGERY  2003   resection carcinoid lingula-lt upper lobe   RADIOACTIVE SEED GUIDED PARTIAL MASTECTOMY WITH AXILLARY SENTINEL LYMPH NODE BIOPSY Left 06/26/2014   Procedure: RADIOACTIVE SEED GUIDED PARTIAL MASTECTOMY WITH AXILLARY SENTINEL LYMPH NODE BIOPSY;  Surgeon: FStark Klein MD;  [place] [facility]  Service: General;  Laterality: Left;   RE-EXCISION OF BREAST LUMPECTOMY Left 08/07/2014   Procedure: RE-EXCISION OF LEFT BREAST LUMPECTOMY;  Surgeon: FStark Klein MD;  [place] [facility]  Service: General;  Laterality: Left;   RIGHT HEART CATH N/A 04/27/2016   Procedure: Right Heart Cath;  Surgeon: DLarey Dresser MD;  [facility];  Service: Cardiovascular;  Laterality: N/A;    Family History  Problem Relation Age of Onset   Stroke Mother    Hypertension Mother    Hyperlipidemia Mother    Stroke Father    Transient ischemic attack Father    Hyperlipidemia Father    Hypertension Father    Atrial fibrillation Son    Heart attack Neg Hx     Social History   Socioeconomic History   Marital status: Married    Spouse name: [name]   Number of children: Not on file   Years of education: Not on file   Highest education level: Not on file  Occupational History   Not on file  Tobacco Use   Smoking status: Former    Packs/day: 0.75    Years: 5.00    Pack years: 3.75    Types: Cigarettes    Quit date: 06/15/1966     Years since quitting: 54.3   Smokeless tobacco: Never  Vaping Use   Vaping Use: Never used  Substance and Sexual Activity   Alcohol use: Yes    Alcohol/week: 0.0 standard drinks    Comment: occasional wine   Drug use: No   Sexual activity: Not Currently  Other Topics Concern   Not on file  Social History Narrative   Retired. Lives with husband.    Right Handed   Drinks 1-2 cups caffeine daily   Social Determinants of Health   Financial Resource Strain: Not on file  Food Insecurity: Not on file  Transportation Needs: Not on file  Physical Activity: Not on file  Stress: Not on file  Social Connections: Not on file  Intimate Partner Violence: Not on file    Outpatient Medications Prior to Visit  Medication Sig Dispense Refill   albuterol (PROVENTIL HFA;VENTOLIN HFA) 108 (90 Base) MCG/ACT inhaler Inhale 2 puffs into the lungs every 6 (six) hours as needed for wheezing or shortness of breath.     Apoaequorin (PREVAGEN) 10 MG CAPS Take 1 capsule by mouth every morning. 1 capsule 1   ARMOUR THYROID 60 MG tablet TAKE 1 TABLET DAILY BEFORE BREAKFAST 90 tablet 1   atorvastatin (LIPITOR) 40 MG tablet TAKE 1 TABLET DAILY 90 tablet 3   calcium-vitamin D (OSCAL WITH D) 500-200 MG-UNIT tablet Take 1 tablet by mouth daily with breakfast. 30 tablet 0   diltiazem (CARDIZEM CD) 300 MG 24 hr capsule Take 1 capsule (300 mg total) by mouth daily. 90 capsule 3   ELIQUIS 5 MG TABS tablet TAKE 1 TABLET TWICE A DAY 180 tablet 1   fluticasone (FLONASE) 50 MCG/ACT nasal spray 1 spray each nostril following sinus rinses twice daily 16 g 2   fluticasone-salmeterol (ADVAIR HFA) 230-21 MCG/ACT inhaler USE 2 INHALATIONS TWICE A DAY 36 g 4   furosemide (LASIX) 20 MG tablet TAKE 2 TABLETS EVERY MORNING AND 1 TABLET EVERY EVENING 270 tablet 1   gabapentin (NEURONTIN) 400 MG capsule Take 1 capsule (400 mg total) by mouth 2 (two) times daily. 180 capsule 0   L-Methylfolate-B12-B6-B2 (CEREFOLIN) 07-31-48-5 MG TABS  TAKE 1 TABLET BY MOUTH EVERY MORNING 90 tablet 3   levETIRAcetam (KEPPRA XR) 500 MG 24 hr tablet TAKE 1 TABLET DAILY 90 tablet 3   metoprolol succinate (TOPROL-XL) 50 MG 24 hr tablet Take 1.5 tablets (75 mg total) by mouth daily. Take with or immediately following a meal. 135 tablet 3   Multiple Vitamins-Iron (MULTIVITAMIN/IRON) TABS Take 1 tablet by mouth daily.     potassium  chloride SA (KLOR-CON) 20 MEQ tablet TAKE 1 TABLET TWICE A DAY (KEEP UPCOMING APPOINTMENT IN SEPTEMBER WITH DR TURNER BEFORE ANYMORE REFILLS) 180 tablet 3   Vitamin D, Ergocalciferol, (DRISDOL) 1.25 MG (50000 UNIT) CAPS capsule TAKE 1 CAPSULE EVERY WEDNESDAY AND SUNDAY 24 capsule 3   omeprazole (PRILOSEC) 20 MG capsule TAKE 1 CAPSULE TWICE A DAY BEFORE MEALS 180 capsule 3   Facility-Administered Medications Prior to Visit  Medication Dose Route Frequency Provider Last Rate Last Admin   denosumab (PROLIA) injection 60 mg  60 mg Subcutaneous Once Magrinat, Virgie Dad, MD        Allergies  Allergen Reactions   Combigan [Brimonidine Tartrate-Timolol] Itching    Eyes itch, reddened   Other Other (See Comments)    Per patient made OU red, Sore, and sensitivity to light   Sulfa Antibiotics Rash   Sulfamethoxazole-Trimethoprim Hives    ROS Review of Systems  Constitutional:  Positive for fatigue. Negative for activity change, appetite change, chills and fever.  HENT:  Negative for congestion, postnasal drip, rhinorrhea, sinus pressure, sinus pain and sore throat.        Has had some increased mood to clear her throat.  Often has hoarse voice.  Eyes: Negative.   Respiratory:  Positive for cough and wheezing.   Cardiovascular:  Negative for chest pain and palpitations.  Gastrointestinal:  Negative for constipation, diarrhea, nausea and vomiting.  Endocrine: Negative for cold intolerance, heat intolerance, polydipsia and polyuria.  Genitourinary: Negative.   Musculoskeletal:  Negative for back pain and myalgias.  Skin:   Negative for rash.  Allergic/Immunologic: Negative.   Neurological:  Negative for dizziness, weakness and headaches.  Psychiatric/Behavioral:  Negative for dysphoric mood. The patient is not nervous/anxious.      Objective:    Physical Exam Vitals and nursing note reviewed.  Constitutional:      Appearance: Normal appearance. She is well-developed.  HENT:     Head: Normocephalic and atraumatic.     Right Ear: Tympanic membrane, ear canal and external ear normal.     Left Ear: Tympanic membrane, ear canal and external ear normal.     Nose: Nose normal.     Mouth/Throat:     Mouth: Mucous membranes are moist.     Pharynx: Oropharynx is clear.  Eyes:     Extraocular Movements: Extraocular movements intact.     Conjunctiva/sclera: Conjunctivae normal.     Pupils: Pupils are equal, round, and reactive to light.  Neck:     Vascular: No carotid bruit.  Cardiovascular:     Rate and Rhythm: Normal rate and regular rhythm.     Pulses: Normal pulses.     Heart sounds: Normal heart sounds.  Pulmonary:     Effort: Pulmonary effort is normal.     Breath sounds: Normal breath sounds.  Abdominal:     General: Bowel sounds are normal.     Palpations: Abdomen is soft.     Tenderness: There is no abdominal tenderness.  Musculoskeletal:        General: Normal range of motion.     Cervical back: Normal range of motion and neck supple.  Lymphadenopathy:     Cervical: No cervical adenopathy.  Skin:    General: Skin is warm and dry.     Capillary Refill: Capillary refill takes less than 2 seconds.  Neurological:     General: No focal deficit present.     Mental Status: She is alert and oriented to person, place, and  time.  Psychiatric:        Mood and Affect: Mood normal.        Behavior: Behavior normal.        Thought Content: Thought content normal.        Judgment: Judgment normal.   Today's Vitals   10/15/20 0819  BP: 102/68  Pulse: 93  Temp: (!) 97 F (36.1 C)  SpO2: 95%   Weight: 142 lb 4.8 oz (64.5 kg)  Height: 5' (1.524 m)   Body mass index is 27.79 kg/m.   Wt Readings from Last 3 Encounters:  10/15/20 142 lb 4.8 oz (64.5 kg)  10/03/20 141 lb 8 oz (64.2 kg)  09/12/20 141 lb (64 kg)     Health Maintenance Due  Topic Date Due   Zoster Vaccines- Shingrix (1 of 2) Never done   COVID-19 Vaccine (4 - Booster for Pfizer series) 02/26/2020   INFLUENZA VACCINE  09/30/2020    There are no preventive care reminders to display for this patient.  Lab Results  Component Value Date   TSH 4.940 (H) 09/30/2020   Lab Results  Component Value Date   WBC 6.9 09/30/2020   HGB 15.1 09/30/2020   HCT 44.4 09/30/2020   MCV 95 09/30/2020   PLT 267 09/30/2020   Lab Results  Component Value Date   NA 142 09/30/2020   K 4.3 09/30/2020   CHLORIDE 105 09/22/2016   CO2 25 09/30/2020   GLUCOSE 104 (H) 09/30/2020   BUN 23 09/30/2020   CREATININE 1.09 (H) 09/30/2020   BILITOT 0.4 09/30/2020   ALKPHOS 120 09/30/2020   AST 16 09/30/2020   ALT 20 09/30/2020   PROT 6.5 09/30/2020   ALBUMIN 4.0 09/30/2020   CALCIUM 8.9 09/30/2020   ANIONGAP 11 04/19/2019   EGFR 52 (L) 09/30/2020   GFR 57.38 (L) 04/01/2016   Lab Results  Component Value Date   CHOL 134 09/30/2020   Lab Results  Component Value Date   HDL 61 09/30/2020   Lab Results  Component Value Date   LDLCALC 62 09/30/2020   Lab Results  Component Value Date   TRIG 49 09/30/2020   Lab Results  Component Value Date   CHOLHDL 2.2 09/30/2020   Lab Results  Component Value Date   HGBA1C 6.1 (H) 09/30/2020      Assessment & Plan:  1. Gastroesophageal reflux disease without esophagitis Suspect that cough may be related to worsening GERD.  Will stop omeprazole.  Trial of pantoprazole 40 mg daily.  Encouraged her to limit exposure to common trigger foods, especially those that are spicy.  Also encouraged her to sleep with her head of bed raised at 30 degrees.  We will reassess at next  visit. - pantoprazole (PROTONIX) 40 MG tablet; Take 1 tablet (40 mg total) by mouth daily.  Dispense: 30 tablet; Refill: 3  2. Cough variant asthma Will get chest x-ray for further evaluation. - DG Chest 2 View; Future  3. Essential hypertension Stable.  Continue blood pressure medications as prescribed.  Problem List Items Addressed This Visit       Cardiovascular and Mediastinum   Essential hypertension (Chronic)     Respiratory   Cough variant asthma (Chronic)   Relevant Orders   DG Chest 2 View     Digestive   GERD - Primary   Relevant Medications   pantoprazole (PROTONIX) 40 MG tablet    Meds ordered this encounter  Medications   pantoprazole (PROTONIX) 40 MG tablet  Sig: Take 1 tablet (40 mg total) by mouth daily.    Dispense:  30 tablet    Refill:  3    Omeprazole not controlling symptoms. Patient having cough related to eating. Trial of pantoprazole.    Order Specific Question:   Supervising Provider    Answer:   Beatrice Lecher D [[id number]]    This note was dictated using Dragon Voice Recognition Software. Rapid proofreading was performed to expedite the delivery of the information. Despite proofreading, phonetic errors will occur which are common with this voice recognition software. Please take this into consideration. If there are any concerns, please contact our office.    Follow-up: Return in about 6 weeks (around 11/26/2020) for chane omeprazole to pantoprazole. review chest x-ray .    Ronnell Freshwater, NP

## 2020-10-31 ENCOUNTER — Other Ambulatory Visit: Payer: Self-pay | Admitting: Physician Assistant

## 2020-11-04 ENCOUNTER — Other Ambulatory Visit: Payer: Self-pay | Admitting: Physician Assistant

## 2020-11-04 DIAGNOSIS — R053 Chronic cough: Secondary | ICD-10-CM

## 2020-11-12 DIAGNOSIS — Z23 Encounter for immunization: Secondary | ICD-10-CM | POA: Diagnosis not present

## 2020-11-14 ENCOUNTER — Other Ambulatory Visit: Payer: Self-pay

## 2020-11-14 ENCOUNTER — Other Ambulatory Visit: Payer: Medicare Other

## 2020-11-14 DIAGNOSIS — E032 Hypothyroidism due to medicaments and other exogenous substances: Secondary | ICD-10-CM

## 2020-11-15 LAB — T3: T3, Total: 87 ng/dL (ref 71–180)

## 2020-11-15 LAB — T4, FREE: Free T4: 0.87 ng/dL (ref 0.82–1.77)

## 2020-11-15 LAB — TSH: TSH: 2.93 u[IU]/mL (ref 0.450–4.500)

## 2020-11-26 ENCOUNTER — Ambulatory Visit: Payer: Medicare Other | Admitting: Nurse Practitioner

## 2020-11-28 ENCOUNTER — Ambulatory Visit (INDEPENDENT_AMBULATORY_CARE_PROVIDER_SITE_OTHER): Payer: Medicare Other | Admitting: Nurse Practitioner

## 2020-11-28 ENCOUNTER — Other Ambulatory Visit: Payer: Self-pay

## 2020-11-28 ENCOUNTER — Encounter: Payer: Self-pay | Admitting: Nurse Practitioner

## 2020-11-28 VITALS — BP 100/70 | HR 84 | Temp 97.7°F | Ht 60.0 in | Wt 144.3 lb

## 2020-11-28 DIAGNOSIS — K219 Gastro-esophageal reflux disease without esophagitis: Secondary | ICD-10-CM | POA: Diagnosis not present

## 2020-11-28 DIAGNOSIS — E559 Vitamin D deficiency, unspecified: Secondary | ICD-10-CM | POA: Diagnosis not present

## 2020-11-28 DIAGNOSIS — R413 Other amnesia: Secondary | ICD-10-CM

## 2020-11-28 DIAGNOSIS — J45991 Cough variant asthma: Secondary | ICD-10-CM

## 2020-11-28 MED ORDER — VITAMIN D (ERGOCALCIFEROL) 1.25 MG (50000 UNIT) PO CAPS: ORAL_CAPSULE | ORAL | 3 refills | Status: DC

## 2020-11-28 MED ORDER — PREVAGEN 10 MG PO CAPS
1.0000 | ORAL_CAPSULE | Freq: Every morning | ORAL | 3 refills | Status: DC
Start: 2020-11-28 — End: 2023-09-09

## 2020-11-28 NOTE — Progress Notes (Signed)
Established Patient Office Visit  Subjective:  Patient ID: Patricia [name], female    DOB: 1940-08-29  Age: 80 y.o. MRN: [id number]  CC:  Chief Complaint  Patient presents with   Follow-up    HPI Patricia [name] presents for follow up visit. Had seen me previously due to chronic cough. Had been concerned that this may have been related to worsening GERD. Changed omeprazole to pantoprazole. She is now taking protonix every day. She states that cough has returned to it's baseline. She does have chronic cough and has diagnosis of asthma. Uses Advair twice daily. States that she rarely has to use her rescue inhaler. Most times, taking a cough drop will help the cough. She denies chest pain, chest pressure or shortness of breath.  She has no new concerns or complaints today. She states that she does need to have refills for vitamin d and prevegen. 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 Dx 04/2015 at time of stroke. b. DCCV 06/2015 - did not hold. Amio started then stopped due to QT prolongation.   Chronic cough    Chronic diastolic heart failure ([place]) 06/17/2015   CKD (chronic kidney disease), stage III (HCC)    Cough variant asthma    Disease of pharynx or nasopharynx    Diverticulosis    Enlargement of right atrium 01/23/2020   Essential hypertension    GERD (gastroesophageal reflux disease)    Glaucoma    Hiatal hernia    Hyperlipidemia    Internal hemorrhoids    Laryngospasm    Mitral regurgitation    mild to moderate by echo 12/2019   Multinodular goiter    Osteopenia    Postmenopausal    Pulmonary HTN (HCC)     Moderate with PASP 85mHg by echo 2018 likely Group 2 from pulmonary venous HTN from CHF>>normal PAP by echo 12/2019    Radiation 10/22/14-11/23/14   Left Breast   Stricture and stenosis of cervix     Past Surgical History:  Procedure Laterality Date   BREAST LUMPECTOMY Left    BREAST SURGERY     CARDIOVERSION N/A 06/24/2015   Procedure: CARDIOVERSION;  Surgeon: TSueanne Margarita MD;  [facility]  Service: Cardiovascular;  Laterality: N/A;   CARDIOVERSION N/A 05/19/2016   Procedure: CARDIOVERSION;  Surgeon: TSueanne Margarita MD;  [facility];  Service: Cardiovascular;  Laterality: N/A;   COLONOSCOPY     LUNG CANCER SURGERY  2003   resection carcinoid lingula-lt upper lobe   RADIOACTIVE SEED GUIDED PARTIAL MASTECTOMY WITH AXILLARY SENTINEL LYMPH NODE BIOPSY Left 06/26/2014   Procedure: RADIOACTIVE SEED GUIDED PARTIAL MASTECTOMY WITH AXILLARY SENTINEL LYMPH NODE BIOPSY;  Surgeon: FStark Klein MD;  [facility]  Service: General;  Laterality: Left;   RE-EXCISION OF BREAST LUMPECTOMY Left 08/07/2014   Procedure: RE-EXCISION OF LEFT BREAST LUMPECTOMY;  Surgeon: FStark Klein MD;  [facility]  Service: General;  Laterality: Left;   RIGHT HEART CATH N/A 04/27/2016   Procedure: Right Heart Cath;  Surgeon: DLarey Dresser MD;  [facility];  Service: Cardiovascular;  Laterality: N/A;    Family History  Problem Relation Age of Onset   Stroke Mother    Hypertension Mother    Hyperlipidemia Mother    Stroke Father    Transient ischemic attack Father    Hyperlipidemia Father    Hypertension Father    Atrial fibrillation Son    Heart attack Neg Hx     Social History   Socioeconomic History   Marital status: Married    Spouse name: [name]   Number of children: Not on file   Years of education: Not on file   Highest education level: Not on file  Occupational History   Not on file  Tobacco Use   Smoking status: Former    Packs/day: 0.75    Years: 5.00    Pack years: 3.75    Types: Cigarettes    Quit date: 06/15/1966     Years since quitting: 54.5   Smokeless tobacco: Never  Vaping Use   Vaping Use: Never used  Substance and Sexual Activity   Alcohol use: Yes    Alcohol/week: 0.0 standard drinks    Comment: occasional wine   Drug use: No   Sexual activity: Not Currently  Other Topics Concern   Not on file  Social History Narrative   Retired. Lives with husband.    Right Handed   Drinks 1-2 cups caffeine daily   Social Determinants of Health   Financial Resource Strain: Not on file  Food Insecurity: Not on file  Transportation Needs: Not on file  Physical Activity: Not on file  Stress: Not on file  Social Connections: Not on file  Intimate Partner Violence: Not on file    Outpatient Medications Prior to Visit  Medication Sig Dispense Refill   ADVAIR HFA 230-21 MCG/ACT inhaler USE 2 INHALATIONS TWICE A DAY 36 g 0   albuterol (PROVENTIL HFA;VENTOLIN HFA) 108 (90 Base) MCG/ACT inhaler Inhale 2 puffs into the lungs every 6 (six) hours as needed for wheezing or shortness of breath.     ARMOUR THYROID 60 MG tablet TAKE 1 TABLET DAILY BEFORE BREAKFAST 90 tablet 1   atorvastatin (LIPITOR) 40 MG tablet TAKE 1 TABLET DAILY 90 tablet 3   calcium-vitamin D (OSCAL WITH D) 500-200 MG-UNIT tablet Take 1 tablet by mouth daily with breakfast. 30 tablet 0   diltiazem (CARDIZEM CD) 300 MG 24 hr capsule Take 1 capsule (300 mg total) by mouth daily. 90 capsule 3   ELIQUIS 5 MG TABS tablet TAKE 1 TABLET TWICE A DAY 180 tablet 1   fluticasone (FLONASE) 50 MCG/ACT nasal spray 1 spray each nostril following sinus rinses twice daily 16 g 2   furosemide (LASIX) 20 MG tablet TAKE 2 TABLETS EVERY MORNING AND 1 TABLET EVERY EVENING 270 tablet 1   gabapentin (NEURONTIN) 400 MG capsule TAKE 1 CAPSULE TWICE A DAY 180 capsule 3   L-Methylfolate-B12-B6-B2 (CEREFOLIN) 07-31-48-5 MG TABS TAKE 1 TABLET BY MOUTH EVERY MORNING 90 tablet 3   levETIRAcetam (KEPPRA XR) 500 MG 24 hr tablet TAKE 1 TABLET DAILY 90 tablet 3   metoprolol  succinate (TOPROL-XL) 50 MG 24 hr tablet Take 1.5 tablets (75 mg total) by mouth daily. Take with or immediately following a meal. 135 tablet 3   Multiple Vitamins-Iron (MULTIVITAMIN/IRON) TABS Take 1 tablet by mouth daily.     pantoprazole (PROTONIX) 40 MG tablet Take 1 tablet (40 mg total) by mouth daily. 30 tablet 3   potassium chloride SA (KLOR-CON) 20 MEQ tablet TAKE 1 TABLET TWICE A DAY (KEEP UPCOMING APPOINTMENT IN  SEPTEMBER WITH DR TURNER BEFORE ANYMORE REFILLS) 180 tablet 3   Apoaequorin (PREVAGEN) 10 MG CAPS Take 1 capsule by mouth every morning. 1 capsule 1   Vitamin D, Ergocalciferol, (DRISDOL) 1.25 MG (50000 UNIT) CAPS capsule TAKE 1 CAPSULE EVERY WEDNESDAY AND SUNDAY 24 capsule 3   Facility-Administered Medications Prior to Visit  Medication Dose Route Frequency Provider Last Rate Last Admin   denosumab (PROLIA) injection 60 mg  60 mg Subcutaneous Once Magrinat, Virgie Dad, MD        Allergies  Allergen Reactions   Combigan [Brimonidine Tartrate-Timolol] Itching    Eyes itch, reddened   Other Other (See Comments)    Per patient made OU red, Sore, and sensitivity to light   Sulfa Antibiotics Rash   Sulfamethoxazole-Trimethoprim Hives    ROS Review of Systems  Constitutional:  Negative for activity change, appetite change, chills, fatigue and fever.  HENT:  Negative for congestion, postnasal drip, rhinorrhea, sinus pressure, sinus pain, sneezing and sore throat.   Eyes: Negative.   Respiratory:  Positive for cough. Negative for chest tightness, shortness of breath and wheezing.        Cough has returned to baseline since changing omeprazole to pantoprazole.   Cardiovascular:  Negative for chest pain and palpitations.  Gastrointestinal:  Negative for abdominal pain, constipation, diarrhea, nausea and vomiting.       Improved acid reflux symptoms.  Endocrine: Negative for cold intolerance, heat intolerance, polydipsia and polyuria.  Genitourinary:  Negative for dyspareunia,  dysuria, flank pain, frequency and urgency.  Musculoskeletal:  Negative for arthralgias, back pain and myalgias.  Skin:  Negative for rash.  Allergic/Immunologic: Negative for environmental allergies.  Neurological:  Negative for dizziness, weakness and headaches.  Hematological:  Negative for adenopathy.  Psychiatric/Behavioral:  The patient is not nervous/anxious.      Objective:    Physical Exam Vitals and nursing note reviewed.  Constitutional:      Appearance: Normal appearance. She is well-developed.  HENT:     Head: Normocephalic and atraumatic.     Nose: Nose normal.     Mouth/Throat:     Mouth: Mucous membranes are moist.  Eyes:     Extraocular Movements: Extraocular movements intact.     Conjunctiva/sclera: Conjunctivae normal.     Pupils: Pupils are equal, round, and reactive to light.  Cardiovascular:     Rate and Rhythm: Normal rate and regular rhythm.     Pulses: Normal pulses.     Heart sounds: Normal heart sounds.  Pulmonary:     Effort: Pulmonary effort is normal.     Breath sounds: Normal breath sounds.  Abdominal:     Palpations: Abdomen is soft.  Musculoskeletal:        General: Normal range of motion.     Cervical back: Normal range of motion and neck supple.  Lymphadenopathy:     Cervical: No cervical adenopathy.  Skin:    General: Skin is warm and dry.     Capillary Refill: Capillary refill takes less than 2 seconds.  Neurological:     General: No focal deficit present.     Mental Status: She is alert and oriented to person, place, and time.  Psychiatric:        Mood and Affect: Mood normal.        Behavior: Behavior normal.        Thought Content: Thought content normal.        Judgment: Judgment normal.    Today's Vitals  11/28/20 0924  BP: 100/70  Pulse: 84  Temp: 97.7 F (36.5 C)  SpO2: 97%  Weight: 144 lb 4.8 oz (65.5 kg)  Height: 5' (1.524 m)   Body mass index is 28.18 kg/m.   Wt Readings from Last 3 Encounters:  11/28/20  144 lb 4.8 oz (65.5 kg)  10/15/20 142 lb 4.8 oz (64.5 kg)  10/03/20 141 lb 8 oz (64.2 kg)     Health Maintenance Due  Topic Date Due   Zoster Vaccines- Shingrix (1 of 2) Never done    There are no preventive care reminders to display for this patient.  Lab Results  Component Value Date   TSH 2.930 11/14/2020   Lab Results  Component Value Date   WBC 6.9 09/30/2020   HGB 15.1 09/30/2020   HCT 44.4 09/30/2020   MCV 95 09/30/2020   PLT 267 09/30/2020   Lab Results  Component Value Date   NA 142 09/30/2020   K 4.3 09/30/2020   CHLORIDE 105 09/22/2016   CO2 25 09/30/2020   GLUCOSE 104 (H) 09/30/2020   BUN 23 09/30/2020   CREATININE 1.09 (H) 09/30/2020   BILITOT 0.4 09/30/2020   ALKPHOS 120 09/30/2020   AST 16 09/30/2020   ALT 20 09/30/2020   PROT 6.5 09/30/2020   ALBUMIN 4.0 09/30/2020   CALCIUM 8.9 09/30/2020   ANIONGAP 11 04/19/2019   EGFR 52 (L) 09/30/2020   GFR 57.38 (L) 04/01/2016   Lab Results  Component Value Date   CHOL 134 09/30/2020   Lab Results  Component Value Date   HDL 61 09/30/2020   Lab Results  Component Value Date   LDLCALC 62 09/30/2020   Lab Results  Component Value Date   TRIG 49 09/30/2020   Lab Results  Component Value Date   CHOLHDL 2.2 09/30/2020   Lab Results  Component Value Date   HGBA1C 6.1 (H) 09/30/2020      Assessment & Plan:  1. Gastroesophageal reflux disease without esophagitis Improved.  Continue with pantoprazole once daily.  2. Cough variant asthma At baseline.  Continue Advair twice daily.  Use rescue inhaler as needed as prescribed.  3. Vitamin D deficiency Drisdol 50,000 IUs weekly.  New prescription sent to pharmacy. - Vitamin D, Ergocalciferol, (DRISDOL) 1.25 MG (50000 UNIT) CAPS capsule; Take one capsule weekly  Dispense: 12 capsule; Refill: 3  4. Hypocalcemia Continue with calcium and vitamin D supplementation.  New prescription sent for Drisdol 50,000 IUs weekly. - Vitamin D, Ergocalciferol,  (DRISDOL) 1.25 MG (50000 UNIT) CAPS capsule; Take one capsule weekly  Dispense: 12 capsule; Refill: 3  5. Memory changes Renew prescription for Prevagen.  Take daily. - Apoaequorin (PREVAGEN) 10 MG CAPS; Take 1 capsule by mouth every morning.  Dispense: 90 capsule; Refill: 3   Problem List Items Addressed This Visit       Respiratory   Cough variant asthma (Chronic)     Digestive   Gastroesophageal reflux disease without esophagitis - Primary     Other   Vitamin D deficiency (Chronic)   Relevant Medications   Vitamin D, Ergocalciferol, (DRISDOL) 1.25 MG (50000 UNIT) CAPS capsule   Hypocalcemia   Relevant Medications   Vitamin D, Ergocalciferol, (DRISDOL) 1.25 MG (50000 UNIT) CAPS capsule   Memory changes   Relevant Medications   Apoaequorin (PREVAGEN) 10 MG CAPS    Meds ordered this encounter  Medications   Vitamin D, Ergocalciferol, (DRISDOL) 1.25 MG (50000 UNIT) CAPS capsule    Sig: Take one capsule  weekly    Dispense:  12 capsule    Refill:  3    Order Specific Question:   Supervising Provider    Answer:   Beatrice Lecher D [[id number]]   Apoaequorin (PREVAGEN) 10 MG CAPS    Sig: Take 1 capsule by mouth every morning.    Dispense:  90 capsule    Refill:  3    Order Specific Question:   Supervising Provider    Answer:   Beatrice Lecher D [[id number]]   This note was dictated using Dragon Voice Recognition Software. Rapid proofreading was performed to expedite the delivery of the information. Despite proofreading, phonetic errors will occur which are common with this voice recognition software. Please take this into consideration. If there are any concerns, please contact our office.    Follow-up: Return for as scheduled.    Ronnell Freshwater, NP

## 2020-12-10 ENCOUNTER — Other Ambulatory Visit: Payer: Self-pay

## 2020-12-10 ENCOUNTER — Ambulatory Visit (HOSPITAL_COMMUNITY): Payer: Medicare Other | Attending: Cardiovascular Disease

## 2020-12-10 DIAGNOSIS — I351 Nonrheumatic aortic (valve) insufficiency: Secondary | ICD-10-CM | POA: Insufficient documentation

## 2020-12-10 DIAGNOSIS — I5032 Chronic diastolic (congestive) heart failure: Secondary | ICD-10-CM

## 2020-12-10 LAB — ECHOCARDIOGRAM COMPLETE
Area-P 1/2: 4.11 cm2
MV M vel: 3.95 m/s
MV Peak grad: 62.3 mmHg
P 1/2 time: 884 msec
Radius: 0.65 cm
S' Lateral: 3.3 cm

## 2020-12-16 ENCOUNTER — Other Ambulatory Visit: Payer: Self-pay | Admitting: Physician Assistant

## 2020-12-16 DIAGNOSIS — E032 Hypothyroidism due to medicaments and other exogenous substances: Secondary | ICD-10-CM

## 2020-12-18 ENCOUNTER — Other Ambulatory Visit: Payer: Self-pay | Admitting: Cardiology

## 2020-12-18 NOTE — Telephone Encounter (Signed)
Pt last saw Dr Radford Pax 06/27/20, last labs 09/30/20 Creat 1.09, age 80, weight 65.5kg, based on specified criteria pt is on appropriate dosage of Eliquis 5mg  BID for afib.  Will refill rx.

## 2020-12-24 ENCOUNTER — Telehealth: Payer: Self-pay | Admitting: Neurology

## 2020-12-24 NOTE — Telephone Encounter (Signed)
I called patient to offer her appointments with an NP to discuss worsening memory. No answer, left a message asking them to call me back.  There are several openings on the NP's schedule at this time. Please offer the patient one of those appointments when she calls back.

## 2020-12-24 NOTE — Telephone Encounter (Signed)
Pt's husband called wanting wife to be seen sooner due to her memory declining even more. Pt's husband is requesting a call back.

## 2020-12-25 NOTE — Telephone Encounter (Signed)
Pt's returned call. Please call back.

## 2020-12-26 ENCOUNTER — Other Ambulatory Visit: Payer: Self-pay

## 2020-12-26 DIAGNOSIS — K219 Gastro-esophageal reflux disease without esophagitis: Secondary | ICD-10-CM

## 2020-12-26 MED ORDER — PANTOPRAZOLE SODIUM 40 MG PO TBEC: 40.0000 mg | DELAYED_RELEASE_TABLET | Freq: Every day | ORAL | 0 refills | Status: DC

## 2020-12-31 ENCOUNTER — Encounter: Payer: Self-pay | Admitting: Cardiology

## 2020-12-31 ENCOUNTER — Other Ambulatory Visit: Payer: Self-pay

## 2020-12-31 ENCOUNTER — Encounter (HOSPITAL_COMMUNITY): Payer: Self-pay | Admitting: Cardiovascular Disease

## 2020-12-31 ENCOUNTER — Ambulatory Visit (INDEPENDENT_AMBULATORY_CARE_PROVIDER_SITE_OTHER): Payer: Medicare Other | Admitting: Cardiology

## 2020-12-31 VITALS — BP 108/74 | HR 80 | Ht 60.0 in | Wt 147.2 lb

## 2020-12-31 DIAGNOSIS — I340 Nonrheumatic mitral (valve) insufficiency: Secondary | ICD-10-CM

## 2020-12-31 DIAGNOSIS — I4819 Other persistent atrial fibrillation: Secondary | ICD-10-CM

## 2020-12-31 DIAGNOSIS — I351 Nonrheumatic aortic (valve) insufficiency: Secondary | ICD-10-CM

## 2020-12-31 DIAGNOSIS — I272 Pulmonary hypertension, unspecified: Secondary | ICD-10-CM | POA: Diagnosis not present

## 2020-12-31 DIAGNOSIS — I5032 Chronic diastolic (congestive) heart failure: Secondary | ICD-10-CM

## 2020-12-31 DIAGNOSIS — I5033 Acute on chronic diastolic (congestive) heart failure: Secondary | ICD-10-CM | POA: Diagnosis not present

## 2020-12-31 DIAGNOSIS — I10 Essential (primary) hypertension: Secondary | ICD-10-CM

## 2020-12-31 MED ORDER — FUROSEMIDE 20 MG PO TABS: ORAL_TABLET | ORAL | 3 refills | Status: DC

## 2020-12-31 MED ORDER — METOPROLOL SUCCINATE ER 50 MG PO TB24: 75.0000 mg | ORAL_TABLET | Freq: Every day | ORAL | 3 refills | Status: DC

## 2020-12-31 MED ORDER — POTASSIUM CHLORIDE CRYS ER 20 MEQ PO TBCR: EXTENDED_RELEASE_TABLET | ORAL | 3 refills | Status: DC

## 2020-12-31 MED ORDER — DILTIAZEM HCL ER COATED BEADS 300 MG PO CP24: 300.0000 mg | ORAL_CAPSULE | Freq: Every day | ORAL | 3 refills | Status: DC

## 2020-12-31 NOTE — Progress Notes (Signed)
Cardiology Office Note:    Date:  12/31/2020   ID:  Patricia [name], DOB 1940-11-09, MRN [id number]  PCP:  Lorrene Reid, PA-C  Cardiologist:  Fransico Him, MD   Referring MD: Lorrene Reid, PA-C   Chief Complaint  Patient presents with   Congestive Heart Failure   Aortic Insuffiency   Hypertension   Atrial Fibrillation   Sleep Apnea   Mitral Regurgitation    History of Present Illness:    Patricia [name] is a 80 y.o. female with history of chronic AFib on Eliquis (amiodarone stopped because of prolonged QT), CVA, chronic diastolic CHF, mild  AI, normal NST 2017, lung CA resection 2003, breast CA s/p lumpectomy/Tamoxifen/XRT, HTN, HLD.  2D echo in 2018 showed moderate PHTN with PASP 40mmHg and RHC 04/2016 with borderline pulmonary HTN.  Repeat 2D echo 12/2019 showed normal LVF with mild to moderate MR and normal PASP.  She is followed in afib clinic and now permanent atrial fibrillation.    She underwent sleep study in Nov 2021 and was found to have mild OSA with an AHI of 9.6/hr with no central apneas and underwent CPAP titration but could not be adequately treated due to ongoing events and underwent BiPAP titration and was ultimately placed on BIPAP.    She is here today for followup and is doing well.  She denies any chest pain or pressure, SOB, DOE, PND, orthopnea, LE edema, dizziness, palpitations or syncope. She is compliant with her meds and is tolerating meds with no SE.   She recently had a 2D echocardiogram done for follow-up of her mitral regurgitation which showed normal LV size and systolic function, mild RV enlargement and moderate right atrial enlargement as well as moderate to severe MR and TR which are new.  She is now here to discuss findings and set up for TEE  She still has not gotten her PAP device so I will call the DME.   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 Dx 04/2015 at time of stroke. b. DCCV 06/2015 - did not hold. Amio started then stopped due to QT prolongation.   Chronic cough    Chronic diastolic heart failure ([place]) 06/17/2015   CKD (chronic kidney disease), stage III (HCC)    Cough variant asthma    Disease of pharynx or nasopharynx    Diverticulosis    Enlargement of right atrium 01/23/2020   Essential hypertension    GERD (gastroesophageal reflux disease)    Glaucoma    Hiatal hernia    Hyperlipidemia    Internal hemorrhoids    Laryngospasm    Mitral regurgitation    mild to moderate by echo 12/2019   Multinodular goiter    Osteopenia    Postmenopausal    Pulmonary HTN (HCC)     Moderate with PASP 60mmHg by echo 2018 likely Group 2 from pulmonary venous HTN from CHF>>normal PAP by echo 12/2019   Radiation 10/22/14-11/23/14   Left Breast   Stricture and stenosis of cervix     Past Surgical History:  Procedure Laterality Date   BREAST LUMPECTOMY Left    BREAST SURGERY     CARDIOVERSION N/A 06/24/2015   Procedure: CARDIOVERSION;  Surgeon: Eber Hong  Radford Pax, MD;  Location: [place] [facility];  Service: Cardiovascular;  Laterality: N/A;   CARDIOVERSION N/A 05/19/2016   Procedure: CARDIOVERSION;  Surgeon: Sueanne Margarita, MD;  Location: [facility];  Service: Cardiovascular;  Laterality: N/A;   COLONOSCOPY     LUNG CANCER SURGERY  2003   resection carcinoid lingula-lt upper lobe   RADIOACTIVE SEED GUIDED PARTIAL MASTECTOMY WITH AXILLARY SENTINEL LYMPH NODE BIOPSY Left 06/26/2014   Procedure: RADIOACTIVE SEED GUIDED PARTIAL MASTECTOMY WITH AXILLARY SENTINEL LYMPH NODE BIOPSY;  Surgeon: Stark Klein, MD;  Location: [place];  Service: General;  Laterality: Left;   RE-EXCISION OF BREAST LUMPECTOMY Left 08/07/2014   Procedure: RE-EXCISION OF LEFT BREAST LUMPECTOMY;  Surgeon: Stark Klein, MD;  Location: [place];  Service: General;  Laterality: Left;   RIGHT HEART CATH N/A 04/27/2016   Procedure: Right Heart Cath;  Surgeon: Larey Dresser, MD;  Location: [facility];  Service: Cardiovascular;  Laterality: N/A;    Current Medications: Current Meds  Medication Sig   ADVAIR HFA 230-21 MCG/ACT inhaler USE 2 INHALATIONS TWICE A DAY   albuterol (PROVENTIL HFA;VENTOLIN HFA) 108 (90 Base) MCG/ACT inhaler Inhale 2 puffs into the lungs every 6 (six) hours as needed for wheezing or shortness of breath.   apixaban (ELIQUIS) 5 MG TABS tablet TAKE 1 TABLET TWICE A DAY   Apoaequorin (PREVAGEN) 10 MG CAPS Take 1 capsule by mouth every morning.   ARMOUR THYROID 60 MG tablet TAKE 1 TABLET DAILY BEFORE BREAKFAST   atorvastatin (LIPITOR) 40 MG tablet TAKE 1 TABLET DAILY   calcium-vitamin D (OSCAL WITH D) 500-200 MG-UNIT tablet Take 1 tablet by mouth daily with breakfast.   diltiazem (CARDIZEM CD) 300 MG 24 hr capsule Take 1 capsule (300 mg total) by mouth daily.   fluticasone (FLONASE) 50 MCG/ACT nasal spray 1 spray each nostril following sinus rinses twice daily   furosemide (LASIX) 20 MG tablet TAKE 2 TABLETS EVERY MORNING AND 1 TABLET EVERY EVENING   gabapentin (NEURONTIN) 400 MG capsule TAKE 1 CAPSULE TWICE A DAY   L-Methylfolate-B12-B6-B2 (CEREFOLIN) 07-31-48-5 MG TABS TAKE 1 TABLET BY MOUTH EVERY MORNING   levETIRAcetam (KEPPRA XR) 500 MG 24 hr tablet TAKE 1 TABLET DAILY   metoprolol succinate (TOPROL-XL) 50 MG 24 hr tablet Take 1.5 tablets (75 mg total) by mouth daily. Take with or immediately following a meal.   Multiple Vitamins-Iron (MULTIVITAMIN/IRON) TABS Take 1 tablet by mouth daily.   pantoprazole (PROTONIX) 40 MG tablet Take 1 tablet (40 mg total) by mouth daily.   potassium chloride SA (KLOR-CON) 20 MEQ tablet TAKE 1 TABLET TWICE A DAY (KEEP UPCOMING APPOINTMENT IN SEPTEMBER WITH DR Senaida Chilcote BEFORE ANYMORE REFILLS)   Vitamin D, Ergocalciferol, (DRISDOL) 1.25 MG (50000 UNIT) CAPS  capsule Take one capsule weekly     Allergies:   Combigan [brimonidine tartrate-timolol], Other, Sulfa antibiotics, and Sulfamethoxazole-trimethoprim   Social History   Socioeconomic History   Marital status: Married    Spouse name: [name]   Number of children: Not on file   Years of education: Not on file   Highest education level: Not on file  Occupational History   Not on file  Tobacco Use   Smoking status: Former    Packs/day: 0.75    Years: 5.00    Pack years: 3.75    Types: Cigarettes    Quit date: 06/15/1966    Years since quitting: 54.5   Smokeless tobacco: Never  Vaping Use   Vaping Use:  Never used  Substance and Sexual Activity   Alcohol use: Yes    Alcohol/week: 0.0 standard drinks    Comment: occasional wine   Drug use: No   Sexual activity: Not Currently  Other Topics Concern   Not on file  Social History Narrative   Retired. Lives with husband.    Right Handed   Drinks 1-2 cups caffeine daily   Social Determinants of Health   Financial Resource Strain: Not on file  Food Insecurity: Not on file  Transportation Needs: Not on file  Physical Activity: Not on file  Stress: Not on file  Social Connections: Not on file     Family History: The patient's family history includes Atrial fibrillation in her son; Hyperlipidemia in her father and mother; Hypertension in her father and mother; Stroke in her father and mother; Transient ischemic attack in her father. There is no history of Heart attack.  ROS:   Please see the history of present illness.    ROS  All other systems reviewed and negative.   EKGs/Labs/Other Studies Reviewed:    The following studies were reviewed today:  2D echo 12/2019 IMPRESSIONS    1. Left ventricular ejection fraction, by estimation, is 55 to 60%. The  left ventricle has normal function. The left ventricle has no regional  wall motion abnormalities. There is mild left ventricular hypertrophy.  Left ventricular diastolic  parameters  are indeterminate.   2. Right ventricular systolic function is mildly reduced. The right  ventricular size is normal. There is mildly elevated pulmonary artery  systolic pressure.   3. Left atrial size was severely dilated.   4. Right atrial size was severely dilated.   5. The mitral valve is normal in structure. Mild to moderate mitral valve  regurgitation. ERO 0.19 cm^2, RV 23 cc. Moderate mitral annular  calcification.   6. Tricuspid valve regurgitation is moderate.   7. The aortic valve is tricuspid. Aortic valve regurgitation is mild.  Mild to moderate aortic valve sclerosis/calcification is present, without  any evidence of aortic stenosis.   2D echo 11/2020 IMPRESSIONS    1. Left ventricular ejection fraction, by estimation, is 60 to 65%. The  left ventricle has normal function. The left ventricle has no regional  wall motion abnormalities. Left ventricular diastolic function could not  be evaluated. There is the  interventricular septum is flattened in systole and diastole, consistent  with right ventricular pressure and volume overload.   2. Right ventricular systolic function is mildly reduced. The right  ventricular size is mildly enlarged. There is moderately elevated  pulmonary artery systolic pressure. The estimated right ventricular  systolic pressure is 30.0 mmHg.   3. Left atrial size was severely dilated.   4. Right atrial size was severely dilated.   5. The mitral valve is degenerative. Moderate to severe mitral valve  regurgitation. No evidence of mitral stenosis.   6. The tricuspid valve is abnormal. Tricuspid valve regurgitation is  moderate to severe.   7. The aortic valve is tricuspid. Aortic valve regurgitation is mild. No  aortic stenosis is present. Aortic regurgitation PHT measures 884 msec.   8. The inferior vena cava is normal in size with greater than 50%  respiratory variability, suggesting right atrial pressure of 3 mmHg.    Comparison(s): Changes from prior study are noted. MR/TR appear to be  moderate to severe on this study. LV function unchanged. AI remains mild.   EKG:  EKG is not ordered today.  Recent Labs:  09/30/2020: ALT 20; BUN 23; Creatinine, Ser 1.09; Hemoglobin 15.1; Platelets 267; Potassium 4.3; Sodium 142 11/14/2020: TSH 2.930   Recent Lipid Panel    Component Value Date/Time   CHOL 134 09/30/2020 0831   TRIG 49 09/30/2020 0831   TRIG 52 01/13/2006 1012   HDL 61 09/30/2020 0831   CHOLHDL 2.2 09/30/2020 0831   CHOLHDL 3 04/01/2016 0952   VLDL 19.2 04/01/2016 0952   LDLCALC 62 09/30/2020 0831   LDLDIRECT 174.3 06/04/2009 0000    Physical Exam:    VS:  BP 108/74   Pulse 80   Ht 5' (1.524 m)   Wt 147 lb 3.2 oz (66.8 kg)   SpO2 97%   BMI 28.75 kg/m     Wt Readings from Last 3 Encounters:  12/31/20 147 lb 3.2 oz (66.8 kg)  11/28/20 144 lb 4.8 oz (65.5 kg)  10/15/20 142 lb 4.8 oz (64.5 kg)     GEN: Well nourished, well developed in no acute distress HEENT: Normal NECK: No JVD; No carotid bruits LYMPHATICS: No lymphadenopathy CARDIAC:RRR, no murmurs, rubs, gallops RESPIRATORY:  Clear to auscultation without rales, wheezing or rhonchi  ABDOMEN: Soft, non-tender, non-distended MUSCULOSKELETAL:  No edema; No deformity  SKIN: Warm and dry NEUROLOGIC:  Alert and oriented x 3 PSYCHIATRIC:  Normal affect    ASSESSMENT:    1. Chronic diastolic heart failure ([place])   2. Nonrheumatic aortic valve insufficiency   3. Essential hypertension   4. Persistent atrial fibrillation ([place])   5. Pulmonary HTN ([place])   6. Nonrheumatic mitral valve regurgitation    PLAN:    In order of problems listed above:  1.  Chronic diastolic CHF  -Her weight is stable and she does not appear volume overloaded on exam today -continue prescription drug management with Lasix 40 mg daily with as needed refills -I have personally reviewed and interpreted outside labs performed by patient's PCP which  showed SCR 1.09 and K+ 4.3 and Hbg 15.1 in Aug 2022   2.  Aortic insufficiency -mild AI by echo 11/2020  3.  HTN  -Bp is well controlled on exam today -continue prescription drug management with Cardizem CD 300mg  daily and Toprol XL 75mg  daily with PRN refills   4.  Permanent atrial fibrillation  -she is asymptomatic and did not hold in NSR after DCCV due to severely dilated LA -Her LA is severely dilated so not a candidate for ablation.   -She did not tolerate amio due to prolonged QT so likely would happen with other Class III AAD.   -Her heart rate is well controlled on exam today and she denies any palpitations -Continue prescription drug management with Toprol XL 75mg  daily and Elquis 5mg  BID for CHADSVASC score of 5 with as needed refills  5.  Pulmonary HTN  - RHC with no significant pulmonary HTN with mean PAP 29mmHg and PCWP elevated at 65mmHg in the setting of afib.   -2D echo 12/2019 with no PHTN but recent echo showed mildly reduced RV function and mild right ventricular enlargement as well as moderate pulmonary hypertension moderate to severe MR as well as TR. PASP 46.9 mmHg -continue Lasix 40mg  daily -plan for TEE as MR is likely contributing to PHTN  6.  Mitral Regurgitation -mild to moderate by echo 12/2019 and moderate to severe by echo 11/2020 -I have discussed the findings of the echo with worsening MR and pulmonary HTN and have recommended that we proceed with TEE to further define the etiology of  her MR and severity -Shared Decision Making/Informed Consent The risks [esophageal damage, perforation (1:10,000 risk), bleeding, pharyngeal hematoma as well as other potential complications associated with conscious sedation including aspiration, arrhythmia, respiratory failure and death], benefits (treatment guidance and diagnostic support) and alternatives of a transesophageal echocardiogram were discussed in detail with Patricia [name] and she is willing to proceed.   7.  OSA -  She has not gotten her device so I will check with the DME   Medication Adjustments/Labs and Tests Ordered: Current medicines are reviewed at length with the patient today.  Concerns regarding medicines are outlined above.  Orders Placed This Encounter  Procedures   EKG 12-Lead   No orders of the defined types were placed in this encounter.   Signed, Fransico Him, MD  12/31/2020 2:12 PM    [facility]

## 2020-12-31 NOTE — Addendum Note (Signed)
Addended by: Antonieta Iba on: 12/31/2020 02:31 PM   Modules accepted: Orders

## 2020-12-31 NOTE — H&P (View-Only) (Signed)
Cardiology Office Note:    Date:  12/31/2020   ID:  Patricia [name], DOB Aug 15, 1940, MRN [id number]  PCP:  Lorrene Reid, PA-C  Cardiologist:  Fransico Him, MD   Referring MD: Lorrene Reid, PA-C   Chief Complaint  Patient presents with   Congestive Heart Failure   Aortic Insuffiency   Hypertension   Atrial Fibrillation   Sleep Apnea   Mitral Regurgitation    History of Present Illness:    Patricia [name] is a 80 y.o. female with history of chronic AFib on Eliquis (amiodarone stopped because of prolonged QT), CVA, chronic diastolic CHF, mild  AI, normal NST 2017, lung CA resection 2003, breast CA s/p lumpectomy/Tamoxifen/XRT, HTN, HLD.  2D echo in 2018 showed moderate PHTN with PASP 30mmHg and RHC 04/2016 with borderline pulmonary HTN.  Repeat 2D echo 12/2019 showed normal LVF with mild to moderate MR and normal PASP.  She is followed in afib clinic and now permanent atrial fibrillation.    She underwent sleep study in Nov 2021 and was found to have mild OSA with an AHI of 9.6/hr with no central apneas and underwent CPAP titration but could not be adequately treated due to ongoing events and underwent BiPAP titration and was ultimately placed on BIPAP.    She is here today for followup and is doing well.  She denies any chest pain or pressure, SOB, DOE, PND, orthopnea, LE edema, dizziness, palpitations or syncope. She is compliant with her meds and is tolerating meds with no SE.   She recently had a 2D echocardiogram done for follow-up of her mitral regurgitation which showed normal LV size and systolic function, mild RV enlargement and moderate right atrial enlargement as well as moderate to severe MR and TR which are new.  She is now here to discuss findings and set up for TEE  She still has not gotten her PAP device so I will call the DME.   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 Dx 04/2015 at time of stroke. b. DCCV 06/2015 - did not hold. Amio started then stopped due to QT prolongation.   Chronic cough    Chronic diastolic heart failure ([place]) 06/17/2015   CKD (chronic kidney disease), stage III (HCC)    Cough variant asthma    Disease of pharynx or nasopharynx    Diverticulosis    Enlargement of right atrium 01/23/2020   Essential hypertension    GERD (gastroesophageal reflux disease)    Glaucoma    Hiatal hernia    Hyperlipidemia    Internal hemorrhoids    Laryngospasm    Mitral regurgitation    mild to moderate by echo 12/2019   Multinodular goiter    Osteopenia    Postmenopausal    Pulmonary HTN (HCC)     Moderate with PASP 49mmHg by echo 2018 likely Group 2 from pulmonary venous HTN from CHF>>normal PAP by echo 12/2019   Radiation 10/22/14-11/23/14   Left Breast   Stricture and stenosis of cervix     Past Surgical History:  Procedure Laterality Date   BREAST LUMPECTOMY Left    BREAST SURGERY     CARDIOVERSION N/A 06/24/2015   Procedure: CARDIOVERSION;  Surgeon: Eber Hong  Radford Pax, MD;  Location: [facility];  Service: Cardiovascular;  Laterality: N/A;   CARDIOVERSION N/A 05/19/2016   Procedure: CARDIOVERSION;  Surgeon: Sueanne Margarita, MD;  Location[facility];  Service: Cardiovascular;  Laterality: N/A;   COLONOSCOPY     LUNG CANCER SURGERY  2003   resection carcinoid lingula-lt upper lobe   RADIOACTIVE SEED GUIDED PARTIAL MASTECTOMY WITH AXILLARY SENTINEL LYMPH NODE BIOPSY Left 06/26/2014   Procedure: RADIOACTIVE SEED GUIDED PARTIAL MASTECTOMY WITH AXILLARY SENTINEL LYMPH NODE BIOPSY;  Surgeon: Stark Klein, MD;  Location: [facility];  Service: General;  Laterality: Left;   RE-EXCISION OF BREAST LUMPECTOMY Left 08/07/2014   Procedure: RE-EXCISION OF LEFT BREAST LUMPECTOMY;  Surgeon: Stark Klein, MD;  Location: [facility];  Service: General;  Laterality: Left;   RIGHT HEART CATH N/A 04/27/2016   Procedure: Right Heart Cath;  Surgeon: Larey Dresser, MD;  Location: [facility];  Service: Cardiovascular;  Laterality: N/A;    Current Medications: Current Meds  Medication Sig   ADVAIR HFA 230-21 MCG/ACT inhaler USE 2 INHALATIONS TWICE A DAY   albuterol (PROVENTIL HFA;VENTOLIN HFA) 108 (90 Base) MCG/ACT inhaler Inhale 2 puffs into the lungs every 6 (six) hours as needed for wheezing or shortness of breath.   apixaban (ELIQUIS) 5 MG TABS tablet TAKE 1 TABLET TWICE A DAY   Apoaequorin (PREVAGEN) 10 MG CAPS Take 1 capsule by mouth every morning.   ARMOUR THYROID 60 MG tablet TAKE 1 TABLET DAILY BEFORE BREAKFAST   atorvastatin (LIPITOR) 40 MG tablet TAKE 1 TABLET DAILY   calcium-vitamin D (OSCAL WITH D) 500-200 MG-UNIT tablet Take 1 tablet by mouth daily with breakfast.   diltiazem (CARDIZEM CD) 300 MG 24 hr capsule Take 1 capsule (300 mg total) by mouth daily.   fluticasone (FLONASE) 50 MCG/ACT nasal spray 1 spray each nostril following sinus rinses twice daily   furosemide (LASIX) 20 MG tablet TAKE 2 TABLETS EVERY MORNING AND 1 TABLET EVERY EVENING   gabapentin (NEURONTIN) 400 MG capsule TAKE 1 CAPSULE TWICE A DAY   L-Methylfolate-B12-B6-B2 (CEREFOLIN) 07-31-48-5 MG TABS TAKE 1 TABLET BY MOUTH EVERY MORNING   levETIRAcetam (KEPPRA XR) 500 MG 24 hr tablet TAKE 1 TABLET DAILY   metoprolol succinate (TOPROL-XL) 50 MG 24 hr tablet Take 1.5 tablets (75 mg total) by mouth daily. Take with or immediately following a meal.   Multiple Vitamins-Iron (MULTIVITAMIN/IRON) TABS Take 1 tablet by mouth daily.   pantoprazole (PROTONIX) 40 MG tablet Take 1 tablet (40 mg total) by mouth daily.   potassium chloride SA (KLOR-CON) 20 MEQ tablet TAKE 1 TABLET TWICE A DAY (KEEP UPCOMING APPOINTMENT IN SEPTEMBER WITH DR Rosamund Nyland BEFORE ANYMORE REFILLS)   Vitamin D, Ergocalciferol, (DRISDOL) 1.25 MG (50000 UNIT) CAPS  capsule Take one capsule weekly     Allergies:   Combigan [brimonidine tartrate-timolol], Other, Sulfa antibiotics, and Sulfamethoxazole-trimethoprim   Social History   Socioeconomic History   Marital status: Married    Spouse name: [name]   Number of children: Not on file   Years of education: Not on file   Highest education level: Not on file  Occupational History   Not on file  Tobacco Use   Smoking status: Former    Packs/day: 0.75    Years: 5.00    Pack years: 3.75    Types: Cigarettes    Quit date: 06/15/1966    Years since quitting: 54.5   Smokeless tobacco: Never  Vaping Use   Vaping Use:  Never used  Substance and Sexual Activity   Alcohol use: Yes    Alcohol/week: 0.0 standard drinks    Comment: occasional wine   Drug use: No   Sexual activity: Not Currently  Other Topics Concern   Not on file  Social History Narrative   Retired. Lives with husband.    Right Handed   Drinks 1-2 cups caffeine daily   Social Determinants of Health   Financial Resource Strain: Not on file  Food Insecurity: Not on file  Transportation Needs: Not on file  Physical Activity: Not on file  Stress: Not on file  Social Connections: Not on file     Family History: The patient's family history includes Atrial fibrillation in her son; Hyperlipidemia in her father and mother; Hypertension in her father and mother; Stroke in her father and mother; Transient ischemic attack in her father. There is no history of Heart attack.  ROS:   Please see the history of present illness.    ROS  All other systems reviewed and negative.   EKGs/Labs/Other Studies Reviewed:    The following studies were reviewed today:  2D echo 12/2019 IMPRESSIONS    1. Left ventricular ejection fraction, by estimation, is 55 to 60%. The  left ventricle has normal function. The left ventricle has no regional  wall motion abnormalities. There is mild left ventricular hypertrophy.  Left ventricular diastolic  parameters  are indeterminate.   2. Right ventricular systolic function is mildly reduced. The right  ventricular size is normal. There is mildly elevated pulmonary artery  systolic pressure.   3. Left atrial size was severely dilated.   4. Right atrial size was severely dilated.   5. The mitral valve is normal in structure. Mild to moderate mitral valve  regurgitation. ERO 0.19 cm^2, RV 23 cc. Moderate mitral annular  calcification.   6. Tricuspid valve regurgitation is moderate.   7. The aortic valve is tricuspid. Aortic valve regurgitation is mild.  Mild to moderate aortic valve sclerosis/calcification is present, without  any evidence of aortic stenosis.   2D echo 11/2020 IMPRESSIONS    1. Left ventricular ejection fraction, by estimation, is 60 to 65%. The  left ventricle has normal function. The left ventricle has no regional  wall motion abnormalities. Left ventricular diastolic function could not  be evaluated. There is the  interventricular septum is flattened in systole and diastole, consistent  with right ventricular pressure and volume overload.   2. Right ventricular systolic function is mildly reduced. The right  ventricular size is mildly enlarged. There is moderately elevated  pulmonary artery systolic pressure. The estimated right ventricular  systolic pressure is 65.9 mmHg.   3. Left atrial size was severely dilated.   4. Right atrial size was severely dilated.   5. The mitral valve is degenerative. Moderate to severe mitral valve  regurgitation. No evidence of mitral stenosis.   6. The tricuspid valve is abnormal. Tricuspid valve regurgitation is  moderate to severe.   7. The aortic valve is tricuspid. Aortic valve regurgitation is mild. No  aortic stenosis is present. Aortic regurgitation PHT measures 884 msec.   8. The inferior vena cava is normal in size with greater than 50%  respiratory variability, suggesting right atrial pressure of 3 mmHg.    Comparison(s): Changes from prior study are noted. MR/TR appear to be  moderate to severe on this study. LV function unchanged. AI remains mild.   EKG:  EKG is not ordered today.  Recent Labs:  09/30/2020: ALT 20; BUN 23; Creatinine, Ser 1.09; Hemoglobin 15.1; Platelets 267; Potassium 4.3; Sodium 142 11/14/2020: TSH 2.930   Recent Lipid Panel    Component Value Date/Time   CHOL 134 09/30/2020 0831   TRIG 49 09/30/2020 0831   TRIG 52 01/13/2006 1012   HDL 61 09/30/2020 0831   CHOLHDL 2.2 09/30/2020 0831   CHOLHDL 3 04/01/2016 0952   VLDL 19.2 04/01/2016 0952   LDLCALC 62 09/30/2020 0831   LDLDIRECT 174.3 06/04/2009 0000    Physical Exam:    VS:  BP 108/74   Pulse 80   Ht 5' (1.524 m)   Wt 147 lb 3.2 oz (66.8 kg)   SpO2 97%   BMI 28.75 kg/m     Wt Readings from Last 3 Encounters:  12/31/20 147 lb 3.2 oz (66.8 kg)  11/28/20 144 lb 4.8 oz (65.5 kg)  10/15/20 142 lb 4.8 oz (64.5 kg)     GEN: Well nourished, well developed in no acute distress HEENT: Normal NECK: No JVD; No carotid bruits LYMPHATICS: No lymphadenopathy CARDIAC:RRR, no murmurs, rubs, gallops RESPIRATORY:  Clear to auscultation without rales, wheezing or rhonchi  ABDOMEN: Soft, non-tender, non-distended MUSCULOSKELETAL:  No edema; No deformity  SKIN: Warm and dry NEUROLOGIC:  Alert and oriented x 3 PSYCHIATRIC:  Normal affect    ASSESSMENT:    1. Chronic diastolic heart failure ([place])   2. Nonrheumatic aortic valve insufficiency   3. Essential hypertension   4. Persistent atrial fibrillation ([place])   5. Pulmonary HTN ([place])   6. Nonrheumatic mitral valve regurgitation    PLAN:    In order of problems listed above:  1.  Chronic diastolic CHF  -Her weight is stable and she does not appear volume overloaded on exam today -continue prescription drug management with Lasix 40 mg daily with as needed refills -I have personally reviewed and interpreted outside labs performed by patient's PCP which  showed SCR 1.09 and K+ 4.3 and Hbg 15.1 in Aug 2022   2.  Aortic insufficiency -mild AI by echo 11/2020  3.  HTN  -Bp is well controlled on exam today -continue prescription drug management with Cardizem CD 300mg  daily and Toprol XL 75mg  daily with PRN refills   4.  Permanent atrial fibrillation  -she is asymptomatic and did not hold in NSR after DCCV due to severely dilated LA -Her LA is severely dilated so not a candidate for ablation.   -She did not tolerate amio due to prolonged QT so likely would happen with other Class III AAD.   -Her heart rate is well controlled on exam today and she denies any palpitations -Continue prescription drug management with Toprol XL 75mg  daily and Elquis 5mg  BID for CHADSVASC score of 5 with as needed refills  5.  Pulmonary HTN  - RHC with no significant pulmonary HTN with mean PAP 30mmHg and PCWP elevated at 58mmHg in the setting of afib.   -2D echo 12/2019 with no PHTN but recent echo showed mildly reduced RV function and mild right ventricular enlargement as well as moderate pulmonary hypertension moderate to severe MR as well as TR. PASP 46.9 mmHg -continue Lasix 40mg  daily -plan for TEE as MR is likely contributing to PHTN  6.  Mitral Regurgitation -mild to moderate by echo 12/2019 and moderate to severe by echo 11/2020 -I have discussed the findings of the echo with worsening MR and pulmonary HTN and have recommended that we proceed with TEE to further define the etiology of  her MR and severity -Shared Decision Making/Informed Consent The risks [esophageal damage, perforation (1:10,000 risk), bleeding, pharyngeal hematoma as well as other potential complications associated with conscious sedation including aspiration, arrhythmia, respiratory failure and death], benefits (treatment guidance and diagnostic support) and alternatives of a transesophageal echocardiogram were discussed in detail with Ms. Gloster and she is willing to proceed.   7.  OSA -  She has not gotten her device so I will check with the DME   Medication Adjustments/Labs and Tests Ordered: Current medicines are reviewed at length with the patient today.  Concerns regarding medicines are outlined above.  Orders Placed This Encounter  Procedures   EKG 12-Lead   No orders of the defined types were placed in this encounter.   Signed, Fransico Him, MD  12/31/2020 2:12 PM    [facility]

## 2020-12-31 NOTE — Patient Instructions (Signed)
Medication Instructions:  Your physician recommends that you continue on your current medications as directed. Please refer to the Current Medication list given to you today.  *If you need a refill on your cardiac medications before your next appointment, please call your pharmacy*   Lab Work: TODAY: BMET and CBC If you have labs (blood work) drawn today and your tests are completely normal, you will receive your results only by: [place] (if you have MyChart) OR A paper copy in the mail If you have any lab test that is abnormal or we need to change your treatment, we will call you to review the results.   Testing/Procedures: Your physician has requested that you have a TEE. During a TEE, sound waves are used to create images of your heart. It provides your doctor with information about the size and shape of your heart and how well your heart's chambers and valves are working. In this test, a transducer is attached to the end of a flexible tube that's guided down your throat and into your esophagus (the tube leading from you mouth to your stomach) to get a more detailed image of your heart. You are not awake for the procedure. Please see the instruction sheet given to you today. For further information please visit [email or web address].  Follow-Up: At [facility], you and your health needs are our priority.  As part of our continuing mission to provide you with exceptional heart care, we have created designated Provider Care Teams.  These Care Teams include your primary Cardiologist (physician) and Advanced Practice Providers (APPs -  Physician Assistants and Nurse Practitioners) who all work together to provide you with the care you need, when you need it.  Your next appointment:   6 month(s)  The format for your next appointment:   In Person  Provider:   You may see Fransico Him, MD or one of the following Advanced Practice Providers on your designated Care Team:   Melina Copa,  PA-C Ermalinda Barrios, PA-C   Other Instructions: You are scheduled for a TEE on Tuesday, November 8th with Dr. Acie Fredrickson.  Please arrive at the [facility] (Main Entrance A) at [facility]: [address] [place], [place] [address] at 8:30 am. (1 hour prior to procedure unless lab work is needed; if lab work is needed arrive 1.5 hours ahead)  DIET: Nothing to eat or drink after midnight except a sip of water with medications (see medication instructions below)  FYI: For your safety, and to allow [name] to monitor your vital signs accurately during the surgery/procedure we request that   if you have artificial nails, gel coating, SNS etc. Please have those removed prior to your surgery/procedure. Not having the nail coverings /polish removed may result in cancellation or delay of your surgery/procedure.   Medication Instructions: Hold Lasix (furosemide) the morning of your procedure   Continue your anticoagulant: Eliquis You will need to continue your anticoagulant after your procedure until you  are told by your  Provider that it is safe to stop  Labs: TODAY  You must have a responsible person to drive you home and stay in the waiting area during your procedure. Failure to do so could result in cancellation.  Bring your insurance cards.  *Special Note: Every effort is made to have your procedure done on time. Occasionally there are emergencies that occur at the hospital that may cause delays. Please be patient if a delay does occur.

## 2020-12-31 NOTE — Telephone Encounter (Signed)
I called patient again to discuss her worsening memory. No answer, left a message asking them to call us back.  There are several openings on the NP's schedule at this time. Please offer the patient one of those appointments when she calls back.

## 2021-01-01 ENCOUNTER — Telehealth: Payer: Self-pay

## 2021-01-01 DIAGNOSIS — I10 Essential (primary) hypertension: Secondary | ICD-10-CM

## 2021-01-01 LAB — CBC
Hematocrit: 45.5 % (ref 34.0–46.6)
Hemoglobin: 15.5 g/dL (ref 11.1–15.9)
MCH: 31.6 pg (ref 26.6–33.0)
MCHC: 34.1 g/dL (ref 31.5–35.7)
MCV: 93 fL (ref 79–97)
Platelets: 289 10*3/uL (ref 150–450)
RBC: 4.91 x10E6/uL (ref 3.77–5.28)
RDW: 12.2 % (ref 11.7–15.4)
WBC: 7.6 10*3/uL (ref 3.4–10.8)

## 2021-01-01 LAB — BASIC METABOLIC PANEL
BUN/Creatinine Ratio: 17 (ref 12–28)
BUN: 22 mg/dL (ref 8–27)
CO2: 28 mmol/L (ref 20–29)
Calcium: 9.2 mg/dL (ref 8.7–10.3)
Chloride: 102 mmol/L (ref 96–106)
Creatinine, Ser: 1.27 mg/dL — ABNORMAL HIGH (ref 0.57–1.00)
Glucose: 97 mg/dL (ref 70–99)
Potassium: 4.4 mmol/L (ref 3.5–5.2)
Sodium: 142 mmol/L (ref 134–144)
eGFR: 43 mL/min/{1.73_m2} — ABNORMAL LOW (ref >59)

## 2021-01-01 MED ORDER — FUROSEMIDE 40 MG PO TABS: 40.0000 mg | ORAL_TABLET | Freq: Every day | ORAL | 3 refills | Status: DC

## 2021-01-01 NOTE — Progress Notes (Signed)
Attempted to obtain medical history via telephone, unable to reach at this time. I left a voicemail to return pre surgical testing department's phone call.  

## 2021-01-01 NOTE — Telephone Encounter (Signed)
-----   Message from Sueanne Margarita, MD sent at 01/01/2021 11:59 AM EDT ----- Serum creatinine has bumped from 1.09-1.27.  Please have her change Lasix to 40 mg every morning and repeat bmet in 1 week

## 2021-01-01 NOTE — Telephone Encounter (Signed)
The patient has been notified of the result and verbalized understanding.  All questions (if any) were answered. Antonieta Iba, RN 01/01/2021 4:50 PM  Patient will reduce her lasix to 40 mg once daily. She will come in next week to repeat lab work.

## 2021-01-02 ENCOUNTER — Ambulatory Visit: Payer: TRICARE For Life (TFL) | Admitting: Adult Health

## 2021-01-06 DIAGNOSIS — Z20822 Contact with and (suspected) exposure to covid-19: Secondary | ICD-10-CM | POA: Diagnosis not present

## 2021-01-07 ENCOUNTER — Ambulatory Visit (HOSPITAL_COMMUNITY): Payer: Medicare Other | Admitting: Anesthesiology

## 2021-01-07 ENCOUNTER — Ambulatory Visit (HOSPITAL_BASED_OUTPATIENT_CLINIC_OR_DEPARTMENT_OTHER): Payer: Medicare Other

## 2021-01-07 ENCOUNTER — Other Ambulatory Visit: Payer: Self-pay

## 2021-01-07 ENCOUNTER — Encounter (HOSPITAL_COMMUNITY): Payer: Self-pay | Admitting: Cardiovascular Disease

## 2021-01-07 ENCOUNTER — Other Ambulatory Visit: Payer: TRICARE For Life (TFL)

## 2021-01-07 ENCOUNTER — Other Ambulatory Visit (HOSPITAL_COMMUNITY): Payer: Self-pay | Admitting: Cardiovascular Disease

## 2021-01-07 ENCOUNTER — Ambulatory Visit (HOSPITAL_COMMUNITY)
Admission: RE | Admit: 2021-01-07 | Discharge: 2021-01-07 | Disposition: A | Discharge status: Home / Self Care | Payer: Medicare Other | Source: Ambulatory Visit | Attending: Cardiovascular Disease | Admitting: Cardiovascular Disease

## 2021-01-07 ENCOUNTER — Encounter (HOSPITAL_COMMUNITY)
Admission: RE | Disposition: A | Discharge status: Home / Self Care | Payer: Self-pay | Source: Ambulatory Visit | Attending: Cardiovascular Disease

## 2021-01-07 DIAGNOSIS — I088 Other rheumatic multiple valve diseases: Secondary | ICD-10-CM | POA: Diagnosis not present

## 2021-01-07 DIAGNOSIS — I361 Nonrheumatic tricuspid (valve) insufficiency: Secondary | ICD-10-CM

## 2021-01-07 DIAGNOSIS — I4819 Other persistent atrial fibrillation: Secondary | ICD-10-CM | POA: Diagnosis not present

## 2021-01-07 DIAGNOSIS — I272 Pulmonary hypertension, unspecified: Secondary | ICD-10-CM | POA: Diagnosis not present

## 2021-01-07 DIAGNOSIS — I5032 Chronic diastolic (congestive) heart failure: Secondary | ICD-10-CM | POA: Insufficient documentation

## 2021-01-07 DIAGNOSIS — I340 Nonrheumatic mitral (valve) insufficiency: Secondary | ICD-10-CM

## 2021-01-07 DIAGNOSIS — I639 Cerebral infarction, unspecified: Secondary | ICD-10-CM

## 2021-01-07 DIAGNOSIS — N183 Chronic kidney disease, stage 3 unspecified: Secondary | ICD-10-CM | POA: Insufficient documentation

## 2021-01-07 DIAGNOSIS — Z79899 Other long term (current) drug therapy: Secondary | ICD-10-CM | POA: Diagnosis not present

## 2021-01-07 DIAGNOSIS — Z7901 Long term (current) use of anticoagulants: Secondary | ICD-10-CM | POA: Diagnosis not present

## 2021-01-07 DIAGNOSIS — E785 Hyperlipidemia, unspecified: Secondary | ICD-10-CM | POA: Diagnosis not present

## 2021-01-07 DIAGNOSIS — G4733 Obstructive sleep apnea (adult) (pediatric): Secondary | ICD-10-CM | POA: Insufficient documentation

## 2021-01-07 DIAGNOSIS — I130 Hypertensive heart and chronic kidney disease with heart failure and stage 1 through stage 4 chronic kidney disease, or unspecified chronic kidney disease: Secondary | ICD-10-CM | POA: Diagnosis not present

## 2021-01-07 DIAGNOSIS — I083 Combined rheumatic disorders of mitral, aortic and tricuspid valves: Secondary | ICD-10-CM | POA: Diagnosis not present

## 2021-01-07 DIAGNOSIS — D72829 Elevated white blood cell count, unspecified: Secondary | ICD-10-CM | POA: Diagnosis not present

## 2021-01-07 DIAGNOSIS — I4821 Permanent atrial fibrillation: Secondary | ICD-10-CM | POA: Insufficient documentation

## 2021-01-07 HISTORY — PX: TEE WITHOUT CARDIOVERSION: SHX5443

## 2021-01-07 SURGERY — ECHOCARDIOGRAM, TRANSESOPHAGEAL
Anesthesia: Monitor Anesthesia Care

## 2021-01-07 MED ORDER — ONDANSETRON HCL 4 MG/2ML IJ SOLN: 4.0000 mg | Freq: Once | INTRAMUSCULAR | Status: DC | PRN

## 2021-01-07 MED ORDER — PROPOFOL 10 MG/ML IV BOLUS
INTRAVENOUS | Status: DC | PRN
  Administered 2021-01-07: 20 mg via INTRAVENOUS

## 2021-01-07 MED ORDER — IPRATROPIUM-ALBUTEROL 0.5-2.5 (3) MG/3ML IN SOLN
RESPIRATORY_TRACT | Status: AC
  Filled 2021-01-07: qty 3

## 2021-01-07 MED ORDER — SODIUM CHLORIDE 0.9 % IV SOLN: INTRAVENOUS | Status: DC

## 2021-01-07 MED ORDER — FENTANYL CITRATE (PF) 100 MCG/2ML IJ SOLN: 25.0000 ug | INTRAMUSCULAR | Status: DC | PRN

## 2021-01-07 MED ORDER — PROPOFOL 500 MG/50ML IV EMUL
INTRAVENOUS | Status: DC | PRN
  Administered 2021-01-07: 75 ug/kg/min via INTRAVENOUS

## 2021-01-07 MED ORDER — LIDOCAINE 2% (20 MG/ML) 5 ML SYRINGE
INTRAMUSCULAR | Status: DC | PRN
  Administered 2021-01-07: 40 mg via INTRAVENOUS

## 2021-01-07 MED ORDER — PHENYLEPHRINE HCL-NACL 20-0.9 MG/250ML-% IV SOLN
INTRAVENOUS | Status: DC | PRN
  Administered 2021-01-07: 30 ug/min via INTRAVENOUS

## 2021-01-07 MED ORDER — IPRATROPIUM-ALBUTEROL 0.5-2.5 (3) MG/3ML IN SOLN
3.0000 mL | Freq: Once | RESPIRATORY_TRACT | Status: AC
  Administered 2021-01-07: 3 mL via RESPIRATORY_TRACT

## 2021-01-07 NOTE — Anesthesia Preprocedure Evaluation (Addendum)
Anesthesia Evaluation  Patient identified by MRN, date of birth, ID band Patient awake    Reviewed: Allergy & Precautions, NPO status , Patient's Chart, lab work & pertinent test results  Airway Mallampati: III  TM Distance: >3 FB Neck ROM: Full    Dental  (+) Teeth Intact, Dental Advisory Given   Pulmonary asthma (took advair last night, last used albuterol 2 weeks ago) , former smoker,  Quit smoking 1968 Hx lung ca carcinoid s/p resection    Pulmonary exam normal breath sounds clear to auscultation       Cardiovascular hypertension, Pt. on medications and Pt. on home beta blockers pulmonary hypertension (mod pHTN)Normal cardiovascular exam+ dysrhythmias (eliquis LD yesterday) Atrial Fibrillation + Valvular Problems/Murmurs (mod-severe MR, mild AI) MR and AI  Rhythm:Regular Rate:Normal  Echo 11/2020:  1. Left ventricular ejection fraction, by estimation, is 60 to 65%. The  left ventricle has normal function. The left ventricle has no regional  wall motion abnormalities. Left ventricular diastolic function could not  be evaluated. There is the  interventricular septum is flattened in systole and diastole, consistent  with right ventricular pressure and volume overload.  2. Right ventricular systolic function is mildly reduced. The right  ventricular size is mildly enlarged. There is moderately elevated  pulmonary artery systolic pressure. The estimated right ventricular  systolic pressure is 38.1 mmHg.  3. Left atrial size was severely dilated.  4. Right atrial size was severely dilated.  5. The mitral valve is degenerative. Moderate to severe mitral valve  regurgitation. No evidence of mitral stenosis.  6. The tricuspid valve is abnormal. Tricuspid valve regurgitation is  moderate to severe.  7. The aortic valve is tricuspid. Aortic valve regurgitation is mild. No  aortic stenosis is present. Aortic regurgitation PHT  measures 884 msec.  8. The inferior vena cava is normal in size with greater than 50%  respiratory variability, suggesting right atrial pressure of 3 mmHg.    Neuro/Psych Seizures - (no meds), Well Controlled,  CVA (no residual weakness) negative psych ROS   GI/Hepatic Neg liver ROS, hiatal hernia, GERD  Controlled,  Endo/Other  Hypothyroidism   Renal/GU Renal diseaseCKD 3, Cr 1.27  negative genitourinary   Musculoskeletal  (+) Arthritis , Osteoarthritis,    Abdominal   Peds  Hematology negative hematology ROS (+) hct 45.5   Anesthesia Other Findings   Reproductive/Obstetrics negative OB ROS                            Anesthesia Physical Anesthesia Plan  ASA: 4  Anesthesia Plan: MAC   Post-op Pain Management:    Induction:   PONV Risk Score and Plan: 2 and Propofol infusion and TIVA  Airway Management Planned: Natural Airway and Simple Face Mask  Additional Equipment: None  Intra-op Plan:   Post-operative Plan:   Informed Consent: I have reviewed the patients History and Physical, chart, labs and discussed the procedure including the risks, benefits and alternatives for the proposed anesthesia with the patient or authorized representative who has indicated his/her understanding and acceptance.       Plan Discussed with: CRNA  Anesthesia Plan Comments:         Anesthesia Quick Evaluation

## 2021-01-07 NOTE — CV Procedure (Signed)
    Transesophageal Echocardiogram Note  Patricia [name] [id number] April 20, 1940  Procedure: Transesophageal Echocardiogram Indications: mitral regurgitation    Procedure Details Consent: Obtained Time Out: Verified patient identification, verified procedure, site/side was marked, verified correct patient position, special equipment/implants available, Radiology Safety Procedures followed,  medications/allergies/relevent history reviewed, required imaging and test results available.  Performed  Medications:  During this procedure the patient is administered Lidocaine 100 mg IV , Neosynephrine drip ( total of 1185 mcg Neo) , Propofol drip - total of 115 mg Propofol  for sedation.  The patient's heart rate, blood pressure, and oxygen saturation are monitored continuously during the procedure. The period of conscious sedation is 45  minutes, of which I was present face-to-face 100% of this time.  Left Ventrical:  small LV ,   normal LV function   Mitral Valve: the posterior leaflet is calcified and has limited mobility .  There is moderate MR.  There is no reversal of flow in the pulmonary veins.   Aortic Valve: trileaflet,  no AS,  trivial AI  Tricuspid Valve: mild -mod TR   Pulmonic Valve: trivial PI  Left Atrium/ Left atrial appendage: marked LAE.   No thrombi   Atrial septum: no evidence of ASD or PFO by color flow   Aorta: mild - moderate calcification .     Complications: No apparent complications Patient did tolerate procedure well.   Thayer Headings, Brooke Bonito., MD, [facility] 01/07/2021, 9:34 AM

## 2021-01-07 NOTE — Progress Notes (Incomplete)
°  Echocardiogram Echocardiogram Transesophageal has been performed.  Patricia [name] 01/07/2021, 9:47 AM

## 2021-01-07 NOTE — Transfer of Care (Signed)
Immediate Anesthesia Transfer of Care Note  Patient: Patricia [name]  Procedure(s) Performed: TRANSESOPHAGEAL ECHOCARDIOGRAM (TEE)  Patient Location: PACU  Anesthesia Type:MAC  Level of Consciousness: drowsy and patient cooperative  Airway & Oxygen Therapy: Patient Spontanous Breathing and Patient connected to face mask oxygen  Post-op Assessment: Report given to RN, Post -op Vital signs reviewed and stable and Patient moving all extremities  Post vital signs: Reviewed and stable  Last Vitals:  Vitals Value Taken Time  BP 92/69 01/07/21 0943  Temp    Pulse 94 01/07/21 0946  Resp 23 01/07/21 0946  SpO2 100 % 01/07/21 0946  Vitals shown include unvalidated device data.  Last Pain:  Vitals:   01/07/21 0842  TempSrc: Oral  PainSc: 0-No pain         Complications: No notable events documented. duoneb administered in PACU by RN per Dr. Doroteo Glassman verbal order

## 2021-01-07 NOTE — Anesthesia Postprocedure Evaluation (Signed)
Anesthesia Post Note  Patient: Patricia [name]  Procedure(s) Performed: TRANSESOPHAGEAL ECHOCARDIOGRAM (TEE)     Patient location during evaluation: PACU Anesthesia Type: MAC Level of consciousness: awake and alert Pain management: pain level controlled Vital Signs Assessment: post-procedure vital signs reviewed and stable Respiratory status: spontaneous breathing, nonlabored ventilation and respiratory function stable Cardiovascular status: blood pressure returned to baseline and stable Postop Assessment: no apparent nausea or vomiting Anesthetic complications: no   No notable events documented.  Last Vitals:  Vitals:   01/07/21 0940 01/07/21 0955  BP: 92/69 108/70  Pulse: 77 72  Resp: (!) 23 20  Temp: (!) 36.2 C   SpO2: 90% 96%    Last Pain:  Vitals:   01/07/21 0940  TempSrc:   PainSc: 0-No pain                 Pervis Hocking

## 2021-01-07 NOTE — Anesthesia Procedure Notes (Signed)
Procedure Name: MAC Date/Time: 01/07/2021 9:07 AM Performed by: Leonor Liv, CRNA Pre-anesthesia Checklist: Patient identified, Emergency Drugs available, Suction available, Patient being monitored and Timeout performed Patient Re-evaluated:Patient Re-evaluated prior to induction Oxygen Delivery Method: Nasal cannula Airway Equipment and Method: Bite block Placement Confirmation: positive ETCO2 Dental Injury: Teeth and Oropharynx as per pre-operative assessment

## 2021-01-07 NOTE — Interval H&P Note (Signed)
History and Physical Interval Note:  01/07/2021 8:43 AM  [name]  has presented today for surgery, with the diagnosis of MITRAL REGURGITATION.  The various methods of treatment have been discussed with the patient and family. After consideration of risks, benefits and other options for treatment, the patient has consented to  Procedure(s): TRANSESOPHAGEAL ECHOCARDIOGRAM (TEE) (N/A) as a surgical intervention.  The patient's history has been reviewed, patient examined, no change in status, stable for surgery.  I have reviewed the patient's chart and labs.  Questions were answered to the patient's satisfaction.     Mertie Moores

## 2021-01-08 ENCOUNTER — Encounter (HOSPITAL_COMMUNITY): Payer: Self-pay | Admitting: Cardiovascular Disease

## 2021-01-08 ENCOUNTER — Other Ambulatory Visit: Payer: Medicare Other

## 2021-01-08 DIAGNOSIS — I10 Essential (primary) hypertension: Secondary | ICD-10-CM

## 2021-01-08 MED ORDER — FUROSEMIDE 40 MG PO TABS: 40.0000 mg | ORAL_TABLET | Freq: Every day | ORAL | 3 refills | Status: DC

## 2021-01-09 ENCOUNTER — Telehealth: Payer: Self-pay | Admitting: Cardiology

## 2021-01-09 ENCOUNTER — Telehealth: Payer: Self-pay

## 2021-01-09 DIAGNOSIS — R0600 Dyspnea, unspecified: Secondary | ICD-10-CM | POA: Diagnosis not present

## 2021-01-09 DIAGNOSIS — I509 Heart failure, unspecified: Secondary | ICD-10-CM | POA: Diagnosis not present

## 2021-01-09 DIAGNOSIS — I5032 Chronic diastolic (congestive) heart failure: Secondary | ICD-10-CM

## 2021-01-09 DIAGNOSIS — I340 Nonrheumatic mitral (valve) insufficiency: Secondary | ICD-10-CM

## 2021-01-09 LAB — BASIC METABOLIC PANEL
BUN/Creatinine Ratio: 22 (ref 12–28)
BUN: 22 mg/dL (ref 8–27)
CO2: 24 mmol/L (ref 20–29)
Calcium: 9.4 mg/dL (ref 8.7–10.3)
Chloride: 107 mmol/L — ABNORMAL HIGH (ref 96–106)
Creatinine, Ser: 0.99 mg/dL (ref 0.57–1.00)
Glucose: 134 mg/dL — ABNORMAL HIGH (ref 70–99)
Potassium: 4.5 mmol/L (ref 3.5–5.2)
Sodium: 144 mmol/L (ref 134–144)
eGFR: 58 mL/min/{1.73_m2} — ABNORMAL LOW (ref >59)

## 2021-01-09 NOTE — Telephone Encounter (Signed)
Patricia Margarita, MD  Antonieta Iba, RN Moderate MR by TEE - repeat echo in 6 months 2D    The patient's husband (on [place]) has been notified of the result and verbalized understanding.  All questions (if any) were answered. Antonieta Iba, RN 01/09/2021 10:21 AM

## 2021-01-09 NOTE — Telephone Encounter (Signed)
Pt c/o swelling: STAT is pt has developed SOB within 24 hours  If swelling, where is the swelling located? Up to her knees  How much weight have you gained and in what time span? Not sure  Have you gained 3 pounds in a day or 5 pounds in a week? no  Do you have a log of your daily weights (if so, list)? November 1st she was 147 lbs, today 149 lbs  Are you currently taking a fluid pill? Yes, but it was decreased  Are you currently SOB? no  Have you traveled recently? no   Patient's husband states the patient's lasix was decreased from 60 mg to 40 mg and she is now having swelling. He says she was just at the urgent care, because she also has a cough that is worsening. He says she has a chronic cough, but the last few days it has gotten worse. He says he was concerned it was pneumonia, but her lungs were clear. He says she gets SOB when laying down and has had to sleep on the couch.

## 2021-01-09 NOTE — Telephone Encounter (Signed)
Spoke with the patient's husband who reports that the patient has had a deep cough and trouble sleeping due to some shortness of breath. She also has increased swelling in her legs. She went to urgent care today and had x-rays done. Lungs were clear. He states that the PA looked at her legs and noted pitting edema. She recommended that she call her cardiologist in regards to dosage of lasix.  Lasix was decreased to 40 mg once daily on 11/2 due to elevated creatinine. She had repeat lab work yesterday and creatinine has normalized.  Patient has gained 2 lbs in a week. She is avoiding salt. She is not elevating her legs, which I have advised that she start doing.

## 2021-01-10 ENCOUNTER — Ambulatory Visit: Payer: TRICARE For Life (TFL) | Admitting: Nurse Practitioner

## 2021-01-10 NOTE — Telephone Encounter (Signed)
Spoke with the patient who states that her swelling has improved. She has been elevating her legs. She has not been wearing compression hose which I advised that she start doing. She is following a low sodium diet. Patient is currently taking Lasix 40 mg daily.

## 2021-01-13 ENCOUNTER — Ambulatory Visit: Payer: Medicare Other | Admitting: Neurology

## 2021-01-13 ENCOUNTER — Telehealth: Payer: Self-pay | Admitting: Cardiology

## 2021-01-13 DIAGNOSIS — I5032 Chronic diastolic (congestive) heart failure: Secondary | ICD-10-CM

## 2021-01-13 NOTE — Telephone Encounter (Signed)
No availability in office tomorrow. Left message with patient's PCP. Went ahead and scheduled patient to see Dr. Radford Pax on 11/16.

## 2021-01-13 NOTE — Telephone Encounter (Signed)
  Pt c/o swelling: STAT is pt has developed SOB within 24 hours  If swelling, where is the swelling located? legs  How much weight have you gained and in what time span?   Have you gained 3 pounds in a day or 5 pounds in a week?   Do you have a log of your daily weights (if so, list)?   Are you currently taking a fluid pill? Yes   Are you currently SOB? No   Have you traveled recently? No  Pt's husband calling back, he said, pt's legs is swelling up again, it is swelling pretty bad, not sure if pt gained weight

## 2021-01-13 NOTE — Telephone Encounter (Signed)
Patient's husband states that the patient's swelling has returned. She is still having trouble sleeping and lying flat. She is trying to elevate her legs as much as possible. Denies any weight gain or shortness of breath.  Currently taking Lasix 40 mg daily.  Scheduled for BMET on Monday 11/21

## 2021-01-15 ENCOUNTER — Other Ambulatory Visit: Payer: Self-pay

## 2021-01-15 ENCOUNTER — Ambulatory Visit (INDEPENDENT_AMBULATORY_CARE_PROVIDER_SITE_OTHER): Payer: Medicare Other | Admitting: Cardiology

## 2021-01-15 ENCOUNTER — Encounter: Payer: Self-pay | Admitting: Cardiology

## 2021-01-15 VITALS — BP 126/68 | HR 94 | Ht 60.0 in | Wt 150.4 lb

## 2021-01-15 DIAGNOSIS — I4821 Permanent atrial fibrillation: Secondary | ICD-10-CM | POA: Diagnosis not present

## 2021-01-15 DIAGNOSIS — I351 Nonrheumatic aortic (valve) insufficiency: Secondary | ICD-10-CM | POA: Diagnosis not present

## 2021-01-15 DIAGNOSIS — I639 Cerebral infarction, unspecified: Secondary | ICD-10-CM

## 2021-01-15 DIAGNOSIS — I5032 Chronic diastolic (congestive) heart failure: Secondary | ICD-10-CM

## 2021-01-15 DIAGNOSIS — R0602 Shortness of breath: Secondary | ICD-10-CM

## 2021-01-15 DIAGNOSIS — I340 Nonrheumatic mitral (valve) insufficiency: Secondary | ICD-10-CM | POA: Diagnosis not present

## 2021-01-15 DIAGNOSIS — G4733 Obstructive sleep apnea (adult) (pediatric): Secondary | ICD-10-CM | POA: Diagnosis not present

## 2021-01-15 DIAGNOSIS — I10 Essential (primary) hypertension: Secondary | ICD-10-CM | POA: Diagnosis not present

## 2021-01-15 DIAGNOSIS — I272 Pulmonary hypertension, unspecified: Secondary | ICD-10-CM | POA: Diagnosis not present

## 2021-01-15 MED ORDER — FUROSEMIDE 40 MG PO TABS: 60.0000 mg | ORAL_TABLET | Freq: Every day | ORAL | 3 refills | Status: DC

## 2021-01-15 NOTE — Addendum Note (Signed)
Addended by: Antonieta Iba on: 01/15/2021 10:35 AM   Modules accepted: Orders

## 2021-01-15 NOTE — Patient Instructions (Addendum)
Medication Instructions:  Your physician has recommended you make the following change in your medication:  1) INCREASE Lasix (furosemide) to 40 mg twice daily for two days, then start taking 60 mg daily   *If you need a refill on your cardiac medications before your next appointment, please call your pharmacy*  Lab Work: TODAY: BNP, BMET, CBC, TSH IN ONE WEEK: BMET  If you have labs (blood work) drawn today and your tests are completely normal, you will receive your results only by: [place] (if you have MyChart) OR A paper copy in the mail If you have any lab test that is abnormal or we need to change your treatment, we will call you to review the results.  Testing/Procedures: You provider recommends that you have a Chest CTA scan.   Follow-Up: At [facility], you and your health needs are our priority.  As part of our continuing mission to provide you with exceptional heart care, we have created designated Provider Care Teams.  These Care Teams include your primary Cardiologist (physician) and Advanced Practice Providers (APPs -  Physician Assistants and Nurse Practitioners) who all work together to provide you with the care you need, when you need it.   Your next appointment:   3-4 months  The format for your next appointment:   In Person  Provider:   Fransico Him, MD     Other Instructions You have been referred to see our Structural Heart Team.

## 2021-01-15 NOTE — Progress Notes (Addendum)
Cardiology Office Note:    Date:  01/15/2021   ID:  Patricia [name], DOB 1940/08/09, MRN [id number]  PCP:  Lorrene Reid, PA-C  Cardiologist:  Fransico Him, MD   Referring MD: Lorrene Reid, PA-C   Chief Complaint  Patient presents with   Atrial Fibrillation   Congestive Heart Failure   Mitral Regurgitation   Hypertension    History of Present Illness:    Patricia [name] is a 80 y.o. female with history of chronic AFib on Eliquis (amiodarone stopped because of prolonged QT), CVA, chronic diastolic CHF, mild  AI, normal NST 2017, lung CA resection 2003, breast CA s/p lumpectomy/Tamoxifen/XRT, HTN, HLD.  2D echo in 2018 showed moderate PHTN with PASP 49mmHg and RHC 04/2016 with borderline pulmonary HTN.  Repeat 2D echo 12/2019 showed normal LVF with mild to moderate MR and normal PASP.  She is followed in afib clinic and now permanent atrial fibrillation.    She underwent sleep study in Nov 2021 and was found to have mild OSA with an AHI of 9.6/hr with no central apneas and underwent CPAP titration but could not be adequately treated due to ongoing events and underwent BiPAP titration and was ultimately placed on BIPAP.   She recently had a 2D echocardiogram done for follow-up of her mitral regurgitation which showed normal LV size and systolic function, mild RV enlargement and moderate right atrial enlargement as well as moderate to severe MR and TR which are new.  She underwent TEE on 01/07/2021 showing low normal LV function with EF 50 to 55% with severe left atrial enlargement, severe right atrial enlargement and moderate to severe mitral regurgitation with myxomatous tricuspid valve and moderate TR.   She called in a few days ago complaining of increased lower extremity edema up to her knees.  She had also gained 2 pounds.  This occurred after her Lasix was decreased from 60 to 40 mg daily she is also been orthopnea at night and has had to sleep on the couch.  Lasix was decreased due to a  bump in her serum creatinine which resolved on the lower dose of Lasix. She was seen at Urgent care and Cxray showed chronic interstitial lung disease with possible acute airspace disease She is now here for follow-up.  She is still having LE edema as well as SOB that she thinks has gotten worse since I saw her last.  She denies any chest pain, palpitations or dizziness. She continues to have problems with PND and orthopnea. She has not gotten her PAP device yet.   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 Dx 04/2015 at time of stroke. b. DCCV 06/2015 - did not hold. Amio started then stopped due to QT prolongation.   Chronic cough    Chronic diastolic heart failure ([place]) 06/17/2015   CKD (chronic kidney disease), stage III (HCC)    Cough variant asthma    Disease of pharynx or nasopharynx    Diverticulosis    Enlargement of right atrium 01/23/2020   Essential hypertension    GERD (gastroesophageal reflux disease)    Glaucoma    Hiatal hernia    Hyperlipidemia    Internal hemorrhoids    Laryngospasm  Mitral regurgitation    mild to moderate by echo 12/2019 and moderate by TEE 12/2020   Multinodular goiter    Osteopenia    Postmenopausal    Pulmonary HTN (HCC)     Moderate with PASP 53mmHg by echo 2018 likely Group 2 from pulmonary venous HTN from CHF>>normal PAP by echo 12/2019   Radiation 10/22/14-11/23/14   Left Breast   Stricture and stenosis of cervix     Past Surgical History:  Procedure Laterality Date   BREAST LUMPECTOMY Left    BREAST SURGERY     CARDIOVERSION N/A 06/24/2015   Procedure: CARDIOVERSION;  Surgeon: Sueanne Margarita, MD;  Location: [place];  Service: Cardiovascular;  Laterality: N/A;   CARDIOVERSION N/A 05/19/2016   Procedure:  CARDIOVERSION;  Surgeon: Sueanne Margarita, MD;  Location: [place];  Service: Cardiovascular;  Laterality: N/A;   COLONOSCOPY     LUNG CANCER SURGERY  2003   resection carcinoid lingula-lt upper lobe   RADIOACTIVE SEED GUIDED PARTIAL MASTECTOMY WITH AXILLARY SENTINEL LYMPH NODE BIOPSY Left 06/26/2014   Procedure: RADIOACTIVE SEED GUIDED PARTIAL MASTECTOMY WITH AXILLARY SENTINEL LYMPH NODE BIOPSY;  Surgeon: Stark Klein, MD;  Location: [place];  Service: General;  Laterality: Left;   RE-EXCISION OF BREAST LUMPECTOMY Left 08/07/2014   Procedure: RE-EXCISION OF LEFT BREAST LUMPECTOMY;  Surgeon: Stark Klein, MD;  Location: [place];  Service: General;  Laterality: Left;   RIGHT HEART CATH N/A 04/27/2016   Procedure: Right Heart Cath;  Surgeon: Larey Dresser, MD;  Location: [place] [facility];  Service: Cardiovascular;  Laterality: N/A;   TEE WITHOUT CARDIOVERSION N/A 01/07/2021   Procedure: TRANSESOPHAGEAL ECHOCARDIOGRAM (TEE);  Surgeon: Thayer Headings, MD;  Location: [facility];  Service: Cardiovascular;  Laterality: N/A;    Current Medications: Current Meds  Medication Sig   ADVAIR HFA 230-21 MCG/ACT inhaler USE 2 INHALATIONS TWICE A DAY (Patient taking differently: Inhale 2 puffs into the lungs 2 (two) times daily.)   albuterol (PROVENTIL HFA;VENTOLIN HFA) 108 (90 Base) MCG/ACT inhaler Inhale 2 puffs into the lungs every 6 (six) hours as needed for wheezing or shortness of breath.   apixaban (ELIQUIS) 5 MG TABS tablet TAKE 1 TABLET TWICE A DAY   Apoaequorin (PREVAGEN) 10 MG CAPS Take 1 capsule by mouth every morning.   ARMOUR THYROID 60 MG tablet TAKE 1 TABLET DAILY BEFORE BREAKFAST   atorvastatin (LIPITOR) 40 MG tablet TAKE 1 TABLET DAILY (Patient taking differently: Take 40 mg by mouth daily before supper.)   bimatoprost (LUMIGAN) 0.01 % SOLN Place 1 drop into both eyes 2 (two) times daily.   calcium-vitamin D (OSCAL WITH D) 500-200 MG-UNIT tablet Take  1 tablet by mouth daily with breakfast.   diltiazem (CARDIZEM CD) 300 MG 24 hr capsule Take 1 capsule (300 mg total) by mouth daily.   dorzolamide-timolol (COSOPT) 22.3-6.8 MG/ML ophthalmic solution Place 1 drop into both eyes at bedtime.   fluticasone (FLONASE) 50 MCG/ACT nasal spray 1 spray each nostril following sinus rinses twice daily   furosemide (LASIX) 40 MG tablet Take 1 tablet (40 mg total) by mouth daily.   gabapentin (NEURONTIN) 400 MG capsule TAKE 1 CAPSULE TWICE A DAY   L-Methylfolate-B12-B6-B2 (CEREFOLIN) 07-31-48-5 MG TABS TAKE 1 TABLET BY MOUTH EVERY MORNING   levETIRAcetam (KEPPRA XR) 500 MG 24 hr tablet TAKE 1 TABLET DAILY (Patient taking differently: Take 500 mg by mouth every evening.)   loratadine (CLARITIN) 10 MG tablet Take 10 mg by mouth  daily.   metoprolol succinate (TOPROL-XL) 50 MG 24 hr tablet Take 1.5 tablets (75 mg total) by mouth daily. Take with or immediately following a meal. (Patient taking differently: Take 75 mg by mouth every evening. Take with or immediately following a meal.)   Multiple Vitamins-Iron (MULTIVITAMIN/IRON) TABS Take 1 tablet by mouth daily.   Netarsudil Dimesylate (RHOPRESSA) 0.02 % SOLN Place 1 drop into both eyes at bedtime.   pantoprazole (PROTONIX) 40 MG tablet Take 1 tablet (40 mg total) by mouth daily.   potassium chloride SA (KLOR-CON) 20 MEQ tablet TAKE 1 TABLET TWICE A DAY (KEEP UPCOMING APPOINTMENT IN SEPTEMBER WITH DR Trevelle Mcgurn BEFORE ANYMORE REFILLS)   Vitamin D, Ergocalciferol, (DRISDOL) 1.25 MG (50000 UNIT) CAPS capsule Take one capsule weekly (Patient taking differently: Take 50,000 Units by mouth every 7 (seven) days. Sunday)     Allergies:   Combigan [brimonidine tartrate-timolol], Other, Sulfa antibiotics, and Sulfamethoxazole-trimethoprim   Social History   Socioeconomic History   Marital status: Married    Spouse name: [name]   Number of children: Not on file   Years of education: Not on file   Highest education level:  Not on file  Occupational History   Not on file  Tobacco Use   Smoking status: Former    Packs/day: 0.75    Years: 5.00    Pack years: 3.75    Types: Cigarettes    Quit date: 06/15/1966    Years since quitting: 54.6   Smokeless tobacco: Never  Vaping Use   Vaping Use: Never used  Substance and Sexual Activity   Alcohol use: Yes    Alcohol/week: 0.0 standard drinks    Comment: occasional wine   Drug use: No   Sexual activity: Not Currently  Other Topics Concern   Not on file  Social History Narrative   Retired. Lives with husband.    Right Handed   Drinks 1-2 cups caffeine daily   Social Determinants of Health   Financial Resource Strain: Not on file  Food Insecurity: Not on file  Transportation Needs: Not on file  Physical Activity: Not on file  Stress: Not on file  Social Connections: Not on file     Family History: The patient's family history includes Atrial fibrillation in her son; Hyperlipidemia in her father and mother; Hypertension in her father and mother; Stroke in her father and mother; Transient ischemic attack in her father. There is no history of Heart attack.  ROS:   Please see the history of present illness.    ROS  All other systems reviewed and negative.   EKGs/Labs/Other Studies Reviewed:    The following studies were reviewed today:  2D echo 12/2019 IMPRESSIONS    1. Left ventricular ejection fraction, by estimation, is 55 to 60%. The  left ventricle has normal function. The left ventricle has no regional  wall motion abnormalities. There is mild left ventricular hypertrophy.  Left ventricular diastolic parameters  are indeterminate.   2. Right ventricular systolic function is mildly reduced. The right  ventricular size is normal. There is mildly elevated pulmonary artery  systolic pressure.   3. Left atrial size was severely dilated.   4. Right atrial size was severely dilated.   5. The mitral valve is normal in structure. Mild to  moderate mitral valve  regurgitation. ERO 0.19 cm^2, RV 23 cc. Moderate mitral annular  calcification.   6. Tricuspid valve regurgitation is moderate.   7. The aortic valve is tricuspid. Aortic valve regurgitation is  mild.  Mild to moderate aortic valve sclerosis/calcification is present, without  any evidence of aortic stenosis.   2D echo 11/2020 IMPRESSIONS    1. Left ventricular ejection fraction, by estimation, is 60 to 65%. The  left ventricle has normal function. The left ventricle has no regional  wall motion abnormalities. Left ventricular diastolic function could not  be evaluated. There is the  interventricular septum is flattened in systole and diastole, consistent  with right ventricular pressure and volume overload.   2. Right ventricular systolic function is mildly reduced. The right  ventricular size is mildly enlarged. There is moderately elevated  pulmonary artery systolic pressure. The estimated right ventricular  systolic pressure is 10.2 mmHg.   3. Left atrial size was severely dilated.   4. Right atrial size was severely dilated.   5. The mitral valve is degenerative. Moderate to severe mitral valve  regurgitation. No evidence of mitral stenosis.   6. The tricuspid valve is abnormal. Tricuspid valve regurgitation is  moderate to severe.   7. The aortic valve is tricuspid. Aortic valve regurgitation is mild. No  aortic stenosis is present. Aortic regurgitation PHT measures 884 msec.   8. The inferior vena cava is normal in size with greater than 50%  respiratory variability, suggesting right atrial pressure of 3 mmHg.   Comparison(s): Changes from prior study are noted. MR/TR appear to be  moderate to severe on this study. LV function unchanged. AI remains mild.   EKG:  EKG is not ordered today.  Recent Labs: 09/30/2020: ALT 20 11/14/2020: TSH 2.930 12/31/2020: Hemoglobin 15.5; Platelets 289 01/08/2021: BUN 22; Creatinine, Ser 0.99; Potassium 4.5; Sodium 144    Recent Lipid Panel    Component Value Date/Time   CHOL 134 09/30/2020 0831   TRIG 49 09/30/2020 0831   TRIG 52 01/13/2006 1012   HDL 61 09/30/2020 0831   CHOLHDL 2.2 09/30/2020 0831   CHOLHDL 3 04/01/2016 0952   VLDL 19.2 04/01/2016 0952   LDLCALC 62 09/30/2020 0831   LDLDIRECT 174.3 06/04/2009 0000    Physical Exam:    VS:  BP 126/68   Pulse 94   Ht 5' (1.524 m)   Wt 150 lb 6.4 oz (68.2 kg)   SpO2 90%   BMI 29.37 kg/m     Wt Readings from Last 3 Encounters:  01/15/21 150 lb 6.4 oz (68.2 kg)  01/07/21 147 lb 4.3 oz (66.8 kg)  12/31/20 147 lb 3.2 oz (66.8 kg)    GEN: Well nourished, well developed in no acute distress HEENT: Normal NECK: No JVD; No carotid bruits LYMPHATICS: No lymphadenopathy CARDIAC:RRR, no murmurs, rubs, gallops RESPIRATORY:  Clear to auscultation without rales, wheezing or rhonchi  ABDOMEN: Soft, non-tender, non-distended MUSCULOSKELETAL:  1-2+ edema; No deformity  SKIN: Warm and dry NEUROLOGIC:  Alert and oriented x 3 PSYCHIATRIC:  Normal affect   ASSESSMENT:    1. Chronic diastolic heart failure ([place])   2. Nonrheumatic aortic valve insufficiency   3. Essential hypertension   4. Permanent atrial fibrillation ([place])   5. Pulmonary HTN ([place])   6. Nonrheumatic mitral valve regurgitation   7. OSA (obstructive sleep apnea)   8. SOB (shortness of breath)    PLAN:    In order of problems listed above:  1.  Chronic diastolic CHF  -Her Lasix was recently decreased from 60 to 40 mg daily because of a bump in serum creatinine -Unfortunately she has had worsening of her lower extremity edema as well as orthopnea and  shortness of breath requiring her to sleep at night on the couch -I suspect that her significant MR is playing a role in this -Refer her to structural heart team for evaluation for MitraClip -Continue prescription drug management but increase Lasix to 40 mg twice daily x2 days then down to 60 mg a day with as needed refills  -Check  bmet in 1 week  2.  Aortic insufficiency -mild AI by echo 11/2020  3.  HTN  -BP is adequately controlled on exam today -Continue prescription drug management with Cardizem CD 300 mg daily and Toprol-XL 75 mg daily with as needed refills  4.  Permanent atrial fibrillation  -she is asymptomatic and did not hold in NSR after DCCV due to severely dilated LA -Her LA is severely dilated so not a candidate for ablation.   -She did not tolerate amio due to prolonged QT so likely would happen with other Class III AAD.   -Heart rate is well controlled on exam today -Continue prescription drug management with Toprol-XL 75 mg daily and Eliquis 5 mg twice daily for CHADS2 Vascor of 5 with as needed refill   5.  Pulmonary HTN  - RHC with no significant pulmonary HTN with mean PAP 58mmHg and PCWP elevated at 38mmHg in the setting of afib.   -2D echo 12/2019 with no PHTN but recent echo showed mildly reduced RV function and mild right ventricular enlargement as well as moderate pulmonary hypertension moderate to severe MR as well as TR. PASP 46.9 mmHg -continue Lasix   6.  Mitral Regurgitation -mild to moderate by echo 12/2019 and moderate to severe by echo 11/2020 -TEE confirmed moderate to severe mitral regurgitation but EF appeared to be slightly reduced at 50 to 55%. -She has having worsening heart failure symptoms and I am concerned that she may be having decompensated heart failure due to worsening of her mitral regurgitation. -I am going to refer her to the structural heart team for consideration of MitraClip  7.  OSA - She has not gotten her device so I will check with the DME  8.  SOB -likely rmultifactorial related to her severe MR and possible mild restrictive lung disease -her O2 sats are 90% on RA today and lungs are clear -unlikely to have PE as she is on ELiquis but O2 is low today -Hbg was normal on 12/31/2020 -Check BNP -Cxray recently done at Urgent care showed possible  interstitial lung disease with possible acute airspace disease>PFTs were consistent with restriction -will get Chest CTA to rule out PE and assess for CHF vs ILD  Followup with me in 3 months   Medication Adjustments/Labs and Tests Ordered: Current medicines are reviewed at length with the patient today.  Concerns regarding medicines are outlined above.  No orders of the defined types were placed in this encounter.  No orders of the defined types were placed in this encounter.   Signed, Fransico Him, MD  01/15/2021 10:21 AM    [place]

## 2021-01-16 LAB — CBC
Hematocrit: 44.7 % (ref 34.0–46.6)
Hemoglobin: 15 g/dL (ref 11.1–15.9)
MCH: 31.6 pg (ref 26.6–33.0)
MCHC: 33.6 g/dL (ref 31.5–35.7)
MCV: 94 fL (ref 79–97)
Platelets: 295 10*3/uL (ref 150–450)
RBC: 4.74 x10E6/uL (ref 3.77–5.28)
RDW: 13.1 % (ref 11.7–15.4)
WBC: 7.8 10*3/uL (ref 3.4–10.8)

## 2021-01-16 LAB — BASIC METABOLIC PANEL
BUN/Creatinine Ratio: 19 (ref 12–28)
BUN: 23 mg/dL (ref 8–27)
CO2: 28 mmol/L (ref 20–29)
Calcium: 10 mg/dL (ref 8.7–10.3)
Chloride: 102 mmol/L (ref 96–106)
Creatinine, Ser: 1.22 mg/dL — ABNORMAL HIGH (ref 0.57–1.00)
Glucose: 84 mg/dL (ref 70–99)
Potassium: 4.3 mmol/L (ref 3.5–5.2)
Sodium: 144 mmol/L (ref 134–144)
eGFR: 45 mL/min/{1.73_m2} — ABNORMAL LOW (ref >59)

## 2021-01-16 LAB — TSH: TSH: 5.97 u[IU]/mL — ABNORMAL HIGH (ref 0.450–4.500)

## 2021-01-16 LAB — PRO B NATRIURETIC PEPTIDE: NT-Pro BNP: 1773 pg/mL — ABNORMAL HIGH (ref 0–738)

## 2021-01-20 ENCOUNTER — Other Ambulatory Visit: Payer: TRICARE For Life (TFL)

## 2021-01-20 NOTE — Progress Notes (Signed)
Echo Review for Patricia [name] DOB 23-Jul-1940:  Based on the following information, I would not recommend MitraClip for this patient:  -Calcium on posterior leaflet takes up the entire P2 scallop (See 3D image 1 below) -Calcium renders the posterior leaflet nearly immobile (See LVOT image 2 below) -Gradient not provided but based on previous 2 points, would likely be very high

## 2021-01-22 ENCOUNTER — Other Ambulatory Visit: Payer: Self-pay

## 2021-01-22 ENCOUNTER — Other Ambulatory Visit: Payer: Medicare Other | Admitting: *Deleted

## 2021-01-22 ENCOUNTER — Telehealth: Payer: Self-pay | Admitting: Cardiology

## 2021-01-22 DIAGNOSIS — I351 Nonrheumatic aortic (valve) insufficiency: Secondary | ICD-10-CM

## 2021-01-22 DIAGNOSIS — I4821 Permanent atrial fibrillation: Secondary | ICD-10-CM

## 2021-01-22 DIAGNOSIS — I5032 Chronic diastolic (congestive) heart failure: Secondary | ICD-10-CM

## 2021-01-22 DIAGNOSIS — I10 Essential (primary) hypertension: Secondary | ICD-10-CM | POA: Diagnosis not present

## 2021-01-22 DIAGNOSIS — G4733 Obstructive sleep apnea (adult) (pediatric): Secondary | ICD-10-CM

## 2021-01-22 DIAGNOSIS — I272 Pulmonary hypertension, unspecified: Secondary | ICD-10-CM

## 2021-01-22 DIAGNOSIS — I340 Nonrheumatic mitral (valve) insufficiency: Secondary | ICD-10-CM | POA: Diagnosis not present

## 2021-01-22 DIAGNOSIS — R0602 Shortness of breath: Secondary | ICD-10-CM | POA: Diagnosis not present

## 2021-01-22 MED ORDER — FUROSEMIDE 40 MG PO TABS: 40.0000 mg | ORAL_TABLET | Freq: Two times a day (BID) | ORAL | 3 refills | Status: DC

## 2021-01-22 NOTE — Telephone Encounter (Signed)
Pt is returning call from earlier today! 

## 2021-01-22 NOTE — Telephone Encounter (Signed)
Patricia Margarita, MD  01/21/2021 11:14 PM EST     BNP high - increase Lasix to 80mg  BID for 3 days and then decrease to 40mg  BID.  BMET in 1 week  The patient's husband has been notified of the result and verbalized understanding.  All questions (if any) were answered. Antonieta Iba, RN 01/22/2021 5:02 PM

## 2021-01-23 ENCOUNTER — Encounter: Payer: Self-pay | Admitting: Cardiology

## 2021-01-23 LAB — BASIC METABOLIC PANEL
BUN/Creatinine Ratio: 19 (ref 12–28)
BUN: 24 mg/dL (ref 8–27)
CO2: 20 mmol/L (ref 20–29)
Calcium: 9.8 mg/dL (ref 8.7–10.3)
Chloride: 104 mmol/L (ref 96–106)
Creatinine, Ser: 1.25 mg/dL — ABNORMAL HIGH (ref 0.57–1.00)
Glucose: 153 mg/dL — ABNORMAL HIGH (ref 70–99)
Potassium: 4.2 mmol/L (ref 3.5–5.2)
Sodium: 143 mmol/L (ref 134–144)
eGFR: 44 mL/min/{1.73_m2} — ABNORMAL LOW (ref >59)

## 2021-02-03 ENCOUNTER — Other Ambulatory Visit: Payer: Self-pay

## 2021-02-03 ENCOUNTER — Ambulatory Visit (INDEPENDENT_AMBULATORY_CARE_PROVIDER_SITE_OTHER): Payer: Medicare Other | Admitting: Cardiovascular Disease

## 2021-02-03 ENCOUNTER — Encounter: Payer: Self-pay | Admitting: Cardiovascular Disease

## 2021-02-03 VITALS — BP 120/70 | HR 93 | Ht 60.0 in | Wt 147.8 lb

## 2021-02-03 DIAGNOSIS — I5032 Chronic diastolic (congestive) heart failure: Secondary | ICD-10-CM

## 2021-02-03 DIAGNOSIS — I340 Nonrheumatic mitral (valve) insufficiency: Secondary | ICD-10-CM | POA: Diagnosis not present

## 2021-02-03 DIAGNOSIS — I351 Nonrheumatic aortic (valve) insufficiency: Secondary | ICD-10-CM

## 2021-02-03 NOTE — Patient Instructions (Addendum)
Medication Instructions:  Your physician recommends that you continue on your current medications as directed. Please refer to the Current Medication list given to you today.  *If you need a refill on your cardiac medications before your next appointment, please call your pharmacy*   Lab Work: none If you have labs (blood work) drawn today and your tests are completely normal, you will receive your results only by: [place] (if you have MyChart) OR A paper copy in the mail If you have any lab test that is abnormal or we need to change your treatment, we will call you to review the results.   Testing/Procedures: none   Follow-Up: At [facility], you and your health needs are our priority.  As part of our continuing mission to provide you with exceptional heart care, we have created designated Provider Care Teams.  These Care Teams include your primary Cardiologist (physician) and Advanced Practice Providers (APPs -  Physician Assistants and Nurse Practitioners) who all work together to provide you with the care you need, when you need it.  We recommend signing up for the patient portal called "MyChart".  Sign up information is provided on this After Visit Summary.  MyChart is used to connect with patients for Virtual Visits (Telemedicine).  Patients are able to view lab/test results, encounter notes, upcoming appointments, etc.  Non-urgent messages can be sent to your provider as well.   To learn more about what you can do with MyChart, go to [email or web address].

## 2021-02-03 NOTE — Progress Notes (Signed)
Cardiology Office Note:    Date:  02/03/2021   ID:  Patricia [name], DOB 1940/12/30, MRN [id number]  PCP:  Lorrene Reid, PA-C   [facility] Providers Cardiologist:  Fransico Him, MD Electrophysiologist:  Will Meredith Leeds, MD     Referring MD: Lorrene Reid, PA-C   Chief Complaint  Patient presents with   Mitral Regurgitation    History of Present Illness:    Patricia [name] is a 80 y.o. female referred by Dr. Radford Pax for evaluation of mitral regurgitation.  The patient is here with her husband today.  She has a history of permanent atrial fibrillation and chronic diastolic heart failure.  The patient underwent a surveillance echocardiogram in October demonstrating moderate to severe mitral and tricuspid regurgitation.  She was then referred for transesophageal echo which demonstrated moderate to severe mitral regurgitation, severe calcification of the posterior leaflet, and blunting of the pulmonary vein inflow.  She is referred for discussion of potential treatment options.  The patient is reasonably active.  She still plays golf, but has started using a cart because it is easier for her to keep up with the younger players.  She denies shortness of breath with walking, climbing 1 flight of stairs, or with her normal daily activities.  She denies orthopnea or PND.  She has had lower extremity edema and she takes furosemide for this.  She has no chest pain or pressure.  She has not been told of a heart murmur or heart valve problem until recently.  She has no history of rheumatic fever.  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 Dx 04/2015 at time of stroke. b. DCCV 06/2015 - did not hold. Amio started then stopped due to QT prolongation.   Chronic cough    Chronic  diastolic heart failure ([place]) 06/17/2015   CKD (chronic kidney disease), stage III (HCC)    Cough variant asthma    Disease of pharynx or nasopharynx    Diverticulosis    Enlargement of right atrium 01/23/2020   Essential hypertension    GERD (gastroesophageal reflux disease)    Glaucoma    Hiatal hernia    Hyperlipidemia    Internal hemorrhoids    Laryngospasm    Mitral regurgitation    mild to moderate by echo 12/2019 and moderate by TEE 12/2020   Multinodular goiter    Osteopenia    Postmenopausal    Pulmonary HTN (HCC)     Moderate with PASP 34mmHg by echo 2018 likely Group 2 from pulmonary venous HTN from CHF>>normal PAP by echo 12/2019   Radiation 10/22/14-11/23/14   Left Breast   Stricture and stenosis of cervix     Past Surgical History:  Procedure Laterality Date   BREAST LUMPECTOMY Left    BREAST SURGERY     CARDIOVERSION N/A 06/24/2015   Procedure: CARDIOVERSION;  Surgeon: Sueanne Margarita, MD;  Location: [place];  Service: Cardiovascular;  Laterality: N/A;   CARDIOVERSION N/A 05/19/2016   Procedure: CARDIOVERSION;  Surgeon: Sueanne Margarita, MD;  Location: [facility];  Service: Cardiovascular;  Laterality: N/A;   COLONOSCOPY     LUNG CANCER SURGERY  2003   resection carcinoid lingula-lt upper lobe   RADIOACTIVE SEED GUIDED  PARTIAL MASTECTOMY WITH AXILLARY SENTINEL LYMPH NODE BIOPSY Left 06/26/2014   Procedure: RADIOACTIVE SEED GUIDED PARTIAL MASTECTOMY WITH AXILLARY SENTINEL LYMPH NODE BIOPSY;  Surgeon: Stark Klein, MD;  Location: [place];  Service: General;  Laterality: Left;   RE-EXCISION OF BREAST LUMPECTOMY Left 08/07/2014   Procedure: RE-EXCISION OF LEFT BREAST LUMPECTOMY;  Surgeon: Stark Klein, MD;  Location: [place];  Service: General;  Laterality: Left;   RIGHT HEART CATH N/A 04/27/2016   Procedure: Right Heart Cath;  Surgeon: Larey Dresser, MD;  Location: [place] [facility];  Service: Cardiovascular;  Laterality: N/A;    TEE WITHOUT CARDIOVERSION N/A 01/07/2021   Procedure: TRANSESOPHAGEAL ECHOCARDIOGRAM (TEE);  Surgeon: Thayer Headings, MD;  Location[facility] END[facility];  Service: Cardiovascular;  Laterality: N/A;    Current Medications: Current Meds  Medication Sig   ADVAIR HFA 230-21 MCG/ACT inhaler USE 2 INHALATIONS TWICE A DAY (Patient taking differently: Inhale 2 puffs into the lungs 2 (two) times daily.)   albuterol (PROVENTIL HFA;VENTOLIN HFA) 108 (90 Base) MCG/ACT inhaler Inhale 2 puffs into the lungs every 6 (six) hours as needed for wheezing or shortness of breath.   apixaban (ELIQUIS) 5 MG TABS tablet TAKE 1 TABLET TWICE A DAY   Apoaequorin (PREVAGEN) 10 MG CAPS Take 1 capsule by mouth every morning.   ARMOUR THYROID 60 MG tablet TAKE 1 TABLET DAILY BEFORE BREAKFAST   atorvastatin (LIPITOR) 40 MG tablet TAKE 1 TABLET DAILY (Patient taking differently: Take 40 mg by mouth daily before supper.)   bimatoprost (LUMIGAN) 0.01 % SOLN Place 1 drop into both eyes 2 (two) times daily.   calcium-vitamin D (OSCAL WITH D) 500-200 MG-UNIT tablet Take 1 tablet by mouth daily with breakfast.   diltiazem (CARDIZEM CD) 300 MG 24 hr capsule Take 1 capsule (300 mg total) by mouth daily.   dorzolamide-timolol (COSOPT) 22.3-6.8 MG/ML ophthalmic solution Place 1 drop into both eyes at bedtime.   fluticasone (FLONASE) 50 MCG/ACT nasal spray 1 spray each nostril following sinus rinses twice daily   furosemide (LASIX) 40 MG tablet Take 1 tablet (40 mg total) by mouth 2 (two) times daily.   gabapentin (NEURONTIN) 400 MG capsule TAKE 1 CAPSULE TWICE A DAY   L-Methylfolate-B12-B6-B2 (CEREFOLIN) 07-31-48-5 MG TABS TAKE 1 TABLET BY MOUTH EVERY MORNING   levETIRAcetam (KEPPRA XR) 500 MG 24 hr tablet TAKE 1 TABLET DAILY (Patient taking differently: Take 500 mg by mouth every evening.)   loratadine (CLARITIN) 10 MG tablet Take 10 mg by mouth daily.   metoprolol succinate (TOPROL-XL) 50 MG 24 hr tablet Take 1.5 tablets (75 mg total) by  mouth daily. Take with or immediately following a meal. (Patient taking differently: Take 75 mg by mouth every evening. Take with or immediately following a meal.)   Multiple Vitamins-Iron (MULTIVITAMIN/IRON) TABS Take 1 tablet by mouth daily.   Netarsudil Dimesylate (RHOPRESSA) 0.02 % SOLN Place 1 drop into both eyes at bedtime.   pantoprazole (PROTONIX) 40 MG tablet Take 1 tablet (40 mg total) by mouth daily.   potassium chloride SA (KLOR-CON) 20 MEQ tablet TAKE 1 TABLET TWICE A DAY (KEEP UPCOMING APPOINTMENT IN SEPTEMBER WITH DR TURNER BEFORE ANYMORE REFILLS)   Vitamin D, Ergocalciferol, (DRISDOL) 1.25 MG (50000 UNIT) CAPS capsule Take one capsule weekly (Patient taking differently: Take 50,000 Units by mouth every 7 (seven) days. Sunday)     Allergies:   Combigan [brimonidine tartrate-timolol], Other, Sulfa antibiotics, and Sulfamethoxazole-trimethoprim   Social History   Socioeconomic History  Marital status: Married    Spouse name: [name]   Number of children: Not on file   Years of education: Not on file   Highest education level: Not on file  Occupational History   Not on file  Tobacco Use   Smoking status: Former    Packs/day: 0.75    Years: 5.00    Pack years: 3.75    Types: Cigarettes    Quit date: 06/15/1966    Years since quitting: 54.6   Smokeless tobacco: Never  Vaping Use   Vaping Use: Never used  Substance and Sexual Activity   Alcohol use: Yes    Alcohol/week: 0.0 standard drinks    Comment: occasional wine   Drug use: No   Sexual activity: Not Currently  Other Topics Concern   Not on file  Social History Narrative   Retired. Lives with husband.    Right Handed   Drinks 1-2 cups caffeine daily   Social Determinants of Health   Financial Resource Strain: Not on file  Food Insecurity: Not on file  Transportation Needs: Not on file  Physical Activity: Not on file  Stress: Not on file  Social Connections: Not on file     Family History: The  patient's family history includes Atrial fibrillation in her son; Hyperlipidemia in her father and mother; Hypertension in her father and mother; Stroke in her father and mother; Transient ischemic attack in her father. There is no history of Heart attack.  ROS:   Please see the history of present illness.    All other systems reviewed and are negative.  EKGs/Labs/Other Studies Reviewed:    The following studies were reviewed today: TEE:  1. Left ventricular ejection fraction, by estimation, is 50 to 55%. The  left ventricle has low normal function.   2. Right ventricular systolic function is normal. The right ventricular  size is normal.   3. Left atrial size was severely dilated. No left atrial/left atrial  appendage thrombus was detected.   4. Right atrial size was severely dilated.   5. There is no reversal of flow in the pulmonary veins. . The mitral  valve is abnormal. Moderate to severe mitral valve regurgitation.   6. The tricuspid valve is myxomatous. Tricuspid valve regurgitation is  moderate.   7. The aortic valve is tricuspid. Aortic valve regurgitation is trivial.   8. There is mild (Grade II) plaque.   Echo: 1. Left ventricular ejection fraction, by estimation, is 60 to 65%. The  left ventricle has normal function. The left ventricle has no regional  wall motion abnormalities. Left ventricular diastolic function could not  be evaluated. There is the  interventricular septum is flattened in systole and diastole, consistent  with right ventricular pressure and volume overload.   2. Right ventricular systolic function is mildly reduced. The right  ventricular size is mildly enlarged. There is moderately elevated  pulmonary artery systolic pressure. The estimated right ventricular  systolic pressure is 16.1 mmHg.   3. Left atrial size was severely dilated.   4. Right atrial size was severely dilated.   5. The mitral valve is degenerative. Moderate to severe mitral valve   regurgitation. No evidence of mitral stenosis.   6. The tricuspid valve is abnormal. Tricuspid valve regurgitation is  moderate to severe.   7. The aortic valve is tricuspid. Aortic valve regurgitation is mild. No  aortic stenosis is present. Aortic regurgitation PHT measures 884 msec.   8. The inferior vena cava is normal  in size with greater than 50%  respiratory variability, suggesting right atrial pressure of 3 mmHg.   Comparison(s): Changes from prior study are noted. MR/TR appear to be  moderate to severe on this study. LV function unchanged. AI remains mild.   EKG:  EKG is ordered today.  The ekg ordered today demonstrates atrial fibrillation 93 bpm  Recent Labs: 09/30/2020: ALT 20 01/15/2021: Hemoglobin 15.0; NT-Pro BNP 1,773; Platelets 295; TSH 5.970 01/22/2021: BUN 24; Creatinine, Ser 1.25; Potassium 4.2; Sodium 143  Recent Lipid Panel    Component Value Date/Time   CHOL 134 09/30/2020 0831   TRIG 49 09/30/2020 0831   TRIG 52 01/13/2006 1012   HDL 61 09/30/2020 0831   CHOLHDL 2.2 09/30/2020 0831   CHOLHDL 3 04/01/2016 0952   VLDL 19.2 04/01/2016 0952   LDLCALC 62 09/30/2020 0831   LDLDIRECT 174.3 06/04/2009 0000     Risk Assessment/Calculations:    CHA2DS2-VASc Score = 7   This indicates a 11.2% annual risk of stroke. The patient's score is based upon: CHF History: 1 HTN History: 1 Diabetes History: 0 Stroke History: 2 Vascular Disease History: 0 Age Score: 2 Gender Score: 1          Physical Exam:    VS:  BP 120/70   Pulse 93   Ht 5' (1.524 m)   Wt 147 lb 12.8 oz (67 kg)   SpO2 95%   BMI 28.87 kg/m     Wt Readings from Last 3 Encounters:  02/03/21 147 lb 12.8 oz (67 kg)  01/15/21 150 lb 6.4 oz (68.2 kg)  01/07/21 147 lb 4.3 oz (66.8 kg)     GEN:  Well nourished, well developed in no acute distress HEENT: Normal NECK: No JVD; No carotid bruits LYMPHATICS: No lymphadenopathy CARDIAC: Irregularly irregular, no murmurs, rubs,  gallops RESPIRATORY:  Clear to auscultation without rales, wheezing or rhonchi  ABDOMEN: Soft, non-tender, non-distended MUSCULOSKELETAL: Trace pretibial edema bilaterally; No deformity  SKIN: Warm and dry NEUROLOGIC:  Alert and oriented x 3 PSYCHIATRIC:  Normal affect   ASSESSMENT:    1. Nonrheumatic mitral valve regurgitation   2. Chronic diastolic heart failure ([place])   3. Nonrheumatic aortic valve insufficiency    PLAN:    In order of problems listed above:  The patient has moderate to severe (likely 3+) mitral regurgitation.  I suspect this is a combination of atrial functional mitral regurgitation with marked left atrial enlargement as well as degenerative mitral regurgitation with severe calcification of the P2 scallop of the posterior leaflet.  The patient exhibits New York Heart Association functional class I-II symptoms of fatigue.  She has mild to moderate pulmonary hypertension with an estimated RV systolic pressure of 47 mmHg.  It is notable that her pulmonary pressures have been elevated by echo criteria in the past, but invasive hemodynamics showed only borderline PA pressures with a mean pulmonary artery pressure of 24 mmHg.  We discussed the finding of mitral regurgitation today, its clinical impact, potential treatment options, and prognostic issues.  The patient is not a candidate for any commercially available transcatheter therapies such as edge-to-edge repair of the mitral valve because of severe posterior leaflet calcification.  If she were to be treated aggressively, options would be clinical trial devices or cardiac surgical repair or replacement.  Because of her relatively good functional status and quality of life, she is really not interested in either of these treatments.  I would favor ongoing medical therapy and clinical surveillance.  The  patient should have an annual echocardiogram as she has been doing over the years.  She understands to watch for symptoms of  progressive dyspnea, orthopnea, or fatigue.  I would be happy to see her in the future if further valvular problems arise.  Hopefully she will continue to do well with a rate control strategy with atrial fibrillation and echo/clinical surveillance for her valvular disease.           Medication Adjustments/Labs and Tests Ordered: Current medicines are reviewed at length with the patient today.  Concerns regarding medicines are outlined above.  Orders Placed This Encounter  Procedures   EKG 12-Lead   No orders of the defined types were placed in this encounter.   Patient Instructions  Medication Instructions:  Your physician recommends that you continue on your current medications as directed. Please refer to the Current Medication list given to you today.  *If you need a refill on your cardiac medications before your next appointment, please call your pharmacy*   Lab Work: none If you have labs (blood work) drawn today and your tests are completely normal, you will receive your results only by: [place] (if you have MyChart) OR A paper copy in the mail If you have any lab test that is abnormal or we need to change your treatment, we will call you to review the results.   Testing/Procedures: none   Follow-Up: At [facility], you and your health needs are our priority.  As part of our continuing mission to provide you with exceptional heart care, we have created designated Provider Care Teams.  These Care Teams include your primary Cardiologist (physician) and Advanced Practice Providers (APPs -  Physician Assistants and Nurse Practitioners) who all work together to provide you with the care you need, when you need it.  We recommend signing up for the patient portal called "MyChart".  Sign up information is provided on this After Visit Summary.  MyChart is used to connect with patients for Virtual Visits (Telemedicine).  Patients are able to view lab/test results,  encounter notes, upcoming appointments, etc.  Non-urgent messages can be sent to your provider as well.   To learn more about what you can do with MyChart, go to [email or web address].     Signed, Sherren Mocha, MD  02/03/2021 10:43 AM    [place]

## 2021-02-04 ENCOUNTER — Encounter: Payer: Self-pay | Admitting: Adult Health

## 2021-02-04 ENCOUNTER — Ambulatory Visit (INDEPENDENT_AMBULATORY_CARE_PROVIDER_SITE_OTHER): Payer: Medicare Other | Admitting: Adult Health

## 2021-02-04 VITALS — BP 115/81 | HR 80 | Ht 60.0 in | Wt 145.0 lb

## 2021-02-04 DIAGNOSIS — G3184 Mild cognitive impairment, so stated: Secondary | ICD-10-CM

## 2021-02-04 DIAGNOSIS — G40909 Epilepsy, unspecified, not intractable, without status epilepticus: Secondary | ICD-10-CM | POA: Diagnosis not present

## 2021-02-04 DIAGNOSIS — Z8673 Personal history of transient ischemic attack (TIA), and cerebral infarction without residual deficits: Secondary | ICD-10-CM

## 2021-02-04 MED ORDER — MEMANTINE HCL 10 MG PO TABS: 10.0000 mg | ORAL_TABLET | Freq: Two times a day (BID) | ORAL | 5 refills | Status: DC

## 2021-02-04 MED ORDER — MEMANTINE HCL 28 X 5 MG & 21 X 10 MG PO TABS: ORAL_TABLET | ORAL | 0 refills | Status: DC

## 2021-02-04 NOTE — Progress Notes (Signed)
[facility] [address] [place]. [place] [address] [phone]       OFFICE FOLLOW-UP NOTE  Patricia [name] Date of Birth:  1940-11-13 Medical Record Number:  [id number]   HPI: Initial visit 03/27/2019 Patricia [name] is a 80 year old pleasant Caucasian lady with past medical history of left breast cancer with radiation, persistent atrial fibrillation on Eliquis, multinodular goiter, laryngeal spasms, essential hypertension, chronic kidney disease, diastolic heart failure, and hyperlipidemia who was last seen in office on 11/19/2016.  She is seen today for follow-up following recent ER visit on November 30, 2018.  She was doing well until 2 days prior when the husband sent her to another room at home to get her checkbook.  The patient recalled trying to move a rolling chair out of her way and suddenly she fell backwards leaning on the wall and slid down to the floor.  The husband arrived a few minutes later and noted that she was unresponsive and was not speaking or answering his questions.  He called EMS and by the time they arrived patient was gradually returning back to normal.  She could not remember the episode and did not have a memory of the event.  She denied any chest pain, sweating, palpitations, headache, tongue bite injury or incontinence.  CT scan of the head was unremarkable and MRI scan showed no evidence of acute stroke or hemorrhage.  EEG showed bitemporal focal slowing.  Patient was on Eliquis for atrial fibrillation and prior stroke which was continued.  She states she is done well since then and has not had any further episodes of office consciousness or seizures.  The patient's husband however feels that is noticed that her memory is not the same and she has had some cognitive decline.  There have been no episodes of confusion, disorientation or agitation.  She has not had any delusions hallucinations.  She has not had any lab work to look for reversible causes of  cognitive impairment.  She denies any headache, falls or injuries.  She feels her balance may be slightly off. Update 06/22/2019 : She returns for follow-up after last visit 6 months ago. She is accompanied by her husband. She states that him memory difficulties are about unchanged. She is started taking Prevagen 10 mg 1 capsule daily and tolerating it well. She has had no further episodes of confusion or seizure-like episode and she remains on Keppra XR 500 mg daily which is tolerating well without side effects. She has had no recurrent stroke or TIAs either. She remains on Eliquis and is tolerating it well with minor bruising but no bleeding. Blood pressure is well controlled though it is elevated today in office. She has been participating in mentally challenging activities and she does have family history of Alzheimer's and worries about it and wants me to try something else to help . Update 01/04/2020: She returns for follow-up after last visit 6 months ago.  She is accompanied by her husband.  She states she is doing well.  Subjective memory difficulties appear about unchanged.  She remains on provision as well as she started taking Cerefolin NAC and is tolerating it well.  She has had no episodes of seizure confusion.  She is tolerating Keppra XR 500 mg without any side effects.  She has had no recurrent stroke or TIAs either.  She remains on Eliquis which is tolerating well without bruising or bleeding.  Her blood pressures well controlled and usually in the 683 systolic range  and today it is 117/74 in office.  She has no new complaints. Update 07/15/2020 : She returns for follow-up after last visit 6 months ago.  She is accompanied by her husband.  Patient states she is doing well and the memory difficulties are unchanged however the husband feels that the short-term memory is getting worse.  However she remains quite independent in activities of daily living.  She just does not remember to write things  down.  She still ending most of her own affairs.  She remains on Prevagen as well as Cerefolin NAC.  She plays bridge regularly but is not doing any other cognitively challenging activities.  She has had no recurrent stroke or TIA symptoms.  She has had no other episodes of loss of consciousness or seizures either.  She remains on Eliquis which is tolerating well with only minor bruising and no bleeding.  Blood pressure is well controlled.  Dr. Radford Pax her cardiologist increase the dose of metoprolol and blood pressure today is 104/73 and heart rate is 58.  She also remains on Keppra XR 5 mg daily which she is tolerating well without side effects.  She did have follow-up carotid ultrasound done on 01/31/2020 which showed no significant extracranial stenosis.  She has no new complaints. Update 09/12/2020 : She returns for follow-up after last visit with me 2 months ago.  This appointment was made when the husband called requesting she be seen sooner.  She had an episode a week ago when she was to drive to her dentist and her husband program the address and the GPS and the GPS took her through a different route and she appeared confused and lost and missed her appointment.  She came home an hour later and.  Slightly disoriented and frustrated but quickly returned back to baseline.  She denied any headache, tongue bite, seizure-like activity.  She continues to have mild short-term memory difficulties but does do crossword puzzles and mentally challenging activities.  She has had no recurrent TIA or strokelike episodes or seizure-like episodes.  She remains on Keppra which is tolerating well without side effects.  She is also on Eliquis tolerating it well without bruising or bleeding.  Update 02/04/2021 JM: Returns for sooner scheduled visit per request of husband due to concern of worsening memory.  She was seen by Dr. Leonie Man 5 months ago.  She is accompanied today by her husband.  She believes her memory has been stable  but per husband, some decline more so with short-term memory such as remembering recent conversations or events. He has been trying to encourage her to write things down in a planner but is resistant to do so.  Able to maintain ADLs and majority of IADLs independently. Husband pays bills and she only drives short distance.  She will occasionally do memory exercises.  Denies depression or anxiety.  Continues on Prevagen and Cerefolin. MMSE today 24/30 (prior 27/30). Just received CPAP machine and trying to get used to using it.  No recurrent stroke/TIA symptoms or seizure-like episodes.  Compliant on Keppra -denies side effects.  Compliant on Eliquis for history of A. fib and stroke prevention without side effects.  Blood pressure stable at 115/81 today.  No further concerns at this time.       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 Dx 04/2015 at time of stroke. b. DCCV 06/2015 - did not hold. Amio started then stopped due to QT prolongation.   Chronic cough    Chronic diastolic heart failure ([place]) 06/17/2015   CKD (chronic kidney disease), stage III (HCC)    Cough variant asthma    Disease of pharynx or nasopharynx    Diverticulosis    Enlargement of right atrium 01/23/2020   Essential hypertension    GERD (gastroesophageal reflux disease)    Glaucoma    Hiatal hernia    Hyperlipidemia    Internal hemorrhoids    Laryngospasm    Mitral regurgitation    mild to moderate by echo 12/2019 and moderate by TEE 12/2020   Multinodular goiter    Osteopenia    Postmenopausal    Pulmonary HTN (HCC)     Moderate with PASP 40mmHg by echo 2018 likely Group 2 from  pulmonary venous HTN from CHF>>normal PAP by echo 12/2019   Radiation 10/22/14-11/23/14   Left Breast   Stricture and stenosis of cervix     Social History:  Social History   Socioeconomic History   Marital status: Married    Spouse name: [name]   Number of children: Not on file   Years of education: Not on file   Highest education level: Not on file  Occupational History   Not on file  Tobacco Use   Smoking status: Former    Packs/day: 0.75    Years: 5.00    Pack years: 3.75    Types: Cigarettes    Quit date: 06/15/1966    Years since quitting: 54.6   Smokeless tobacco: Never  Vaping Use   Vaping Use: Never used  Substance and Sexual Activity   Alcohol use: Yes    Alcohol/week: 0.0 standard drinks    Comment: occasional wine   Drug use: No   Sexual activity: Not Currently  Other Topics Concern   Not on file  Social History Narrative   Retired. Lives with husband.    Right Handed   Drinks 1-2 cups caffeine daily   Social Determinants of Health   Financial Resource Strain: Not on file  Food Insecurity: Not on file  Transportation Needs: Not on file  Physical Activity: Not on file  Stress: Not on file  Social Connections: Not on file  Intimate Partner Violence: Not on file    Medications:   Current Outpatient Medications on File Prior to Visit  Medication Sig Dispense Refill   ADVAIR HFA 230-21 MCG/ACT inhaler USE 2 INHALATIONS TWICE A DAY (Patient taking differently: Inhale 2 puffs into the lungs 2 (two) times daily.) 36 g 0   albuterol (PROVENTIL HFA;VENTOLIN HFA) 108 (90 Base) MCG/ACT inhaler Inhale 2 puffs into the lungs every 6 (six) hours as needed for wheezing or shortness of breath.     apixaban (ELIQUIS) 5 MG TABS tablet TAKE 1 TABLET TWICE A DAY 180 tablet 1   Apoaequorin (PREVAGEN) 10 MG CAPS Take 1 capsule by mouth every morning. 90 capsule 3   ARMOUR THYROID 60 MG tablet TAKE 1 TABLET DAILY BEFORE BREAKFAST 90 tablet 0   atorvastatin (LIPITOR) 40  MG tablet TAKE 1 TABLET DAILY (Patient taking differently: Take 40 mg by mouth daily before supper.)  90 tablet 3   bimatoprost (LUMIGAN) 0.01 % SOLN Place 1 drop into both eyes 2 (two) times daily.     calcium-vitamin D (OSCAL WITH D) 500-200 MG-UNIT tablet Take 1 tablet by mouth daily with breakfast. 30 tablet 0   diltiazem (CARDIZEM CD) 300 MG 24 hr capsule Take 1 capsule (300 mg total) by mouth daily. 90 capsule 3   dorzolamide-timolol (COSOPT) 22.3-6.8 MG/ML ophthalmic solution Place 1 drop into both eyes at bedtime.     fluticasone (FLONASE) 50 MCG/ACT nasal spray 1 spray each nostril following sinus rinses twice daily 16 g 2   furosemide (LASIX) 40 MG tablet Take 1 tablet (40 mg total) by mouth 2 (two) times daily. 180 tablet 3   gabapentin (NEURONTIN) 400 MG capsule TAKE 1 CAPSULE TWICE A DAY 180 capsule 3   L-Methylfolate-B12-B6-B2 (CEREFOLIN) 07-31-48-5 MG TABS TAKE 1 TABLET BY MOUTH EVERY MORNING 90 tablet 3   levETIRAcetam (KEPPRA XR) 500 MG 24 hr tablet TAKE 1 TABLET DAILY (Patient taking differently: Take 500 mg by mouth every evening.) 90 tablet 3   loratadine (CLARITIN) 10 MG tablet Take 10 mg by mouth daily.     metoprolol succinate (TOPROL-XL) 50 MG 24 hr tablet Take 1.5 tablets (75 mg total) by mouth daily. Take with or immediately following a meal. (Patient taking differently: Take 75 mg by mouth every evening. Take with or immediately following a meal.) 135 tablet 3   Multiple Vitamins-Iron (MULTIVITAMIN/IRON) TABS Take 1 tablet by mouth daily.     Netarsudil Dimesylate (RHOPRESSA) 0.02 % SOLN Place 1 drop into both eyes at bedtime.     pantoprazole (PROTONIX) 40 MG tablet Take 1 tablet (40 mg total) by mouth daily. 90 tablet 0   potassium chloride SA (KLOR-CON) 20 MEQ tablet TAKE 1 TABLET TWICE A DAY (KEEP UPCOMING APPOINTMENT IN SEPTEMBER WITH DR TURNER BEFORE ANYMORE REFILLS) 180 tablet 3   Vitamin D, Ergocalciferol, (DRISDOL) 1.25 MG (50000 UNIT) CAPS capsule Take one capsule  weekly (Patient taking differently: Take 50,000 Units by mouth every 7 (seven) days. Sunday) 12 capsule 3   Current Facility-Administered Medications on File Prior to Visit  Medication Dose Route Frequency Provider Last Rate Last Admin   denosumab (PROLIA) injection 60 mg  60 mg Subcutaneous Once Magrinat, Virgie Dad, MD        Allergies:   Allergies  Allergen Reactions   Combigan [Brimonidine Tartrate-Timolol] Itching    Eyes itch, reddened   Other Other (See Comments)    Per patient made OU red, Sore, and sensitivity to light   Sulfa Antibiotics Rash   Sulfamethoxazole-Trimethoprim Hives    Physical Exam Today's Vitals   02/04/21 0826  BP: 115/81  Pulse: 80  Weight: 145 lb (65.8 kg)  Height: 5' (1.524 m)   Body mass index is 28.32 kg/m.   General: Petite elderly very pleasant Caucasian lady, seated, in no evident distress Head: head normocephalic and atraumatic.  Neck: supple with no carotid or supraclavicular bruits Cardiovascular: regular rate and rhythm, no murmurs Musculoskeletal: Mild kyphoscoliosis Skin:  no rash/petichiae Vascular:  Normal pulses all extremities  Neurologic Exam Mental Status: Awake and fully alert. Oriented to place and time. Recent and remote memory diminished. Attention span, concentration and fund of knowledge appropriate. Mood and affect appropriate.   MMSE - Mini Mental State Exam 02/04/2021 09/12/2020 09/27/2017  Orientation to time 4 3 4   Orientation to Place 4 5 5   Registration 3 3 3   Attention/ Calculation 2 5 5  Recall 2 2 3   Language- name 2 objects 2 2 2   Language- repeat 1 1 1   Language- follow 3 step command 3 3 3   Language- read & follow direction 1 1 1   Write a sentence 1 1 1   Copy design 1 1 1   Total score 24 27 29    Cranial Nerves: Pupils equal, briskly reactive to light. Extraocular movements full without nystagmus. Visual fields full to confrontation. HOH bilaterally. Facial sensation intact. Face, tongue, palate moves  normally and symmetrically.  Motor: Normal bulk and tone. Normal strength in all tested extremity muscles. Sensory.: intact to touch ,pinprick .position and vibratory sensation.  Coordination: Rapid alternating movements normal in all extremities. Finger-to-nose and heel-to-shin performed accurately bilaterally. Gait and Station: Arises from chair without difficulty. Stance is normal. Gait demonstrates normal stride length and balance . Able to heel, toe and tandem walk with moderate difficulty.  Reflexes: 1+ and symmetric. Toes downgoing.        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  Episode of brief loss of consciousness followed by confusion possibly unwitnessed seizure with postictal confusion in September 2020.  Also mild cognitive impairment with continued gradual decline of short-term memory. Episode of transient confusion and disorientation likely related to her baseline mild cognitive impairment doubt seizure in 08/2020    PLAN:  -some decline based on MMSE and short term memory decline per husband -Start Namenda titration with eventual goal of 10 mg twice daily.  Discussed potential side effects and indication for medication and both patient and husband wish to proceed -Referral placed to neuropsychology for neurocognitive evaluation - hx of stroke and family hx of Alzheimer's -continue Prevagen 10 mg and  Cerefolin NAC 1 capsule daily to help with cognitive impairment as well as increase participation in mentally challenging activities like solving crossword puzzles, playing bridge and sudoku.  We also discussed memory compensation strategies with additional information provided.   continue Keppra XR 500 mg daily for seizure prophylaxis  continue Eliquis for stroke prevention with atrial fibrillation and maintain aggressive risk factor  modification.  Check follow-up screening carotid ultrasound study.   Follow-up in 6 months or call earlier if needed    CC:  [facility] provider: Dr. Dalene Seltzer, Herb Grays, PA-C   I spent 32 minutes of face-to-face and non-face-to-face time with patient and husband.  This included previsit chart review, lab review, study review, order entry, electronic health record documentation, patient and husband education and discussion regarding gradual cognitive decline with completion and review of MMSE, further treatment options and neurocognitive evaluation, history of stroke and seizure disorder as above and answered all other questions to patient and husband satisfaction  Frann Rider, AGNP-BC  [facility] [address] [place], [place] [address]  Phone [phone] Fax [phone] Note: This document was prepared with digital dictation and possible smart phrase technology. Any transcriptional errors that result from this process are unintentional.

## 2021-02-04 NOTE — Patient Instructions (Addendum)
Start Namenda as a titration to eventual dose of 10mg  twice daily Titration as follows: 5mg  daily for 7 days 5mg  twice daily for 7 days 5mg  AM and 10mg  PM for 7 days 10 mg twice daily ongoing  You will be called to schedule a neurocognitive evaluation   Increase memory exercises at home and use of compensation strategies such as writing things down, taking notes, and use of a planner    Follow-up in 6 months or call earlier if needed     Memory Compensation Strategies  Use "WARM" strategy.  W= write it down  A= associate it  R= repeat it  M= make a mental note  2.   You can keep a Social worker.  Use a 3-ring notebook with sections for the following: calendar, important names and phone numbers,  medications, doctors' names/phone numbers, lists/reminders, and a section to journal what you did  each day.   3.    Use a calendar to write appointments down.  4.    Write yourself a schedule for the day.  This can be placed on the calendar or in a separate section of the Memory Notebook.  Keeping a  regular schedule can help memory.  5.    Use medication organizer with sections for each day or morning/evening pills.  You may need help loading it  6.    Keep a basket, or pegboard by the door.  Place items that you need to take out with you in the basket or on the pegboard.  You may also want to  include a message board for reminders.  7.    Use sticky notes.  Place sticky notes with reminders in a place where the task is performed.  For example: " turn off the  stove" placed by the stove, "lock the door" placed on the door at eye level, " take your medications" on  the bathroom mirror or by the place where you normally take your medications.  8.    Use alarms/timers.  Use while cooking to remind yourself to check on food or as a reminder to take your medicine, or as a  reminder to make a call, or as a reminder to perform another task, etc.

## 2021-02-05 ENCOUNTER — Ambulatory Visit
Admission: RE | Admit: 2021-02-05 | Discharge: 2021-02-05 | Disposition: A | Discharge status: Home / Self Care | Payer: Medicare Other | Source: Ambulatory Visit | Attending: Cardiology | Admitting: Cardiology

## 2021-02-05 ENCOUNTER — Other Ambulatory Visit: Payer: Self-pay

## 2021-02-05 DIAGNOSIS — J90 Pleural effusion, not elsewhere classified: Secondary | ICD-10-CM | POA: Diagnosis not present

## 2021-02-05 DIAGNOSIS — J701 Chronic and other pulmonary manifestations due to radiation: Secondary | ICD-10-CM | POA: Diagnosis not present

## 2021-02-05 DIAGNOSIS — I351 Nonrheumatic aortic (valve) insufficiency: Secondary | ICD-10-CM

## 2021-02-05 DIAGNOSIS — I4821 Permanent atrial fibrillation: Secondary | ICD-10-CM

## 2021-02-05 DIAGNOSIS — I10 Essential (primary) hypertension: Secondary | ICD-10-CM

## 2021-02-05 DIAGNOSIS — I5032 Chronic diastolic (congestive) heart failure: Secondary | ICD-10-CM

## 2021-02-05 DIAGNOSIS — Z853 Personal history of malignant neoplasm of breast: Secondary | ICD-10-CM | POA: Diagnosis not present

## 2021-02-05 DIAGNOSIS — I340 Nonrheumatic mitral (valve) insufficiency: Secondary | ICD-10-CM

## 2021-02-05 DIAGNOSIS — I272 Pulmonary hypertension, unspecified: Secondary | ICD-10-CM

## 2021-02-05 DIAGNOSIS — G4733 Obstructive sleep apnea (adult) (pediatric): Secondary | ICD-10-CM

## 2021-02-05 DIAGNOSIS — Z85118 Personal history of other malignant neoplasm of bronchus and lung: Secondary | ICD-10-CM | POA: Diagnosis not present

## 2021-02-05 DIAGNOSIS — R0602 Shortness of breath: Secondary | ICD-10-CM

## 2021-02-05 MED ORDER — IOPAMIDOL (ISOVUE-370) INJECTION 76%
75.0000 mL | Freq: Once | INTRAVENOUS | Status: AC | PRN
  Administered 2021-02-05: 75 mL via INTRAVENOUS

## 2021-02-07 ENCOUNTER — Encounter: Payer: Self-pay | Admitting: Cardiology

## 2021-02-07 NOTE — Progress Notes (Signed)
I agree with the above plan 

## 2021-02-10 ENCOUNTER — Other Ambulatory Visit: Payer: Self-pay | Admitting: Cardiology

## 2021-02-11 ENCOUNTER — Telehealth: Payer: Self-pay | Admitting: Adult Health

## 2021-02-11 NOTE — Telephone Encounter (Signed)
Neuropsych referral sent to [facility]. Phone: [phone].

## 2021-02-26 DIAGNOSIS — Z20822 Contact with and (suspected) exposure to covid-19: Secondary | ICD-10-CM | POA: Diagnosis not present

## 2021-03-10 ENCOUNTER — Encounter: Payer: Self-pay | Admitting: Oncology

## 2021-03-10 DIAGNOSIS — H401133 Primary open-angle glaucoma, bilateral, severe stage: Secondary | ICD-10-CM | POA: Diagnosis not present

## 2021-03-17 ENCOUNTER — Other Ambulatory Visit: Payer: Medicare Other

## 2021-03-17 ENCOUNTER — Other Ambulatory Visit: Payer: Self-pay

## 2021-03-17 ENCOUNTER — Telehealth: Payer: Self-pay | Admitting: *Deleted

## 2021-03-17 DIAGNOSIS — G4733 Obstructive sleep apnea (adult) (pediatric): Secondary | ICD-10-CM

## 2021-03-17 DIAGNOSIS — E032 Hypothyroidism due to medicaments and other exogenous substances: Secondary | ICD-10-CM

## 2021-03-17 NOTE — Telephone Encounter (Signed)
Returned the call patient agreed with treatment.

## 2021-03-17 NOTE — Telephone Encounter (Signed)
-----   Message from Sueanne Margarita, MD sent at 02/18/2021  6:57 PM EST ----- Patient has very large mask leak - please find out what mask she is using and if she uses a chin strap.  Change CPAP to auto CPAP from 4 to 20cm H2O and get a download in 2 weeks.  Please set up appt with DME about mask leak and fit ----- Message ----- From: Freada Bergeron, CMA Sent: 02/18/2021   5:30 PM EST To: Sueanne Margarita, MD  Patricia [name], [name] 01/19/2021 - 02/17/2021 DOB: 05-23-1940 Age: 82 years [name], [name] Phone: [phone] Email: [email or web address] Compliance Report Usage 01/19/2021 - 02/17/2021 Usage days 28/30 days (93%) >= 4 hours 23 days (77%) < 4 hours 5 days (17%) Usage hours 143 hours 57 minutes Average usage (total days) 4 hours 48 minutes Average usage (days used) 5 hours 8 minutes Median usage (days used) 5 hours 40 minutes Total used hours (value since last reset - 02/17/2021) 187 hours AirCurve 10 VAuto Serial number [id number] Mode Spont IPAP 17 cmH2O EPAP 11 cmH2O Easy-Breathe On Therapy Leaks - L/min Median: 2.9 95th percentile: 35.1 Maximum: 75.4 Events per hour AI: 19.9 HI: 0.2 AHI: 20.1 Apnea Index Central: 9.3 Obstructive: 9.1 Unknown: 1.5 Usage - hours Printed on 02/18/2021 - [employer]Med AirView version 4.39.0-4.0 Page 1 of  ----- Message ----- From: Antonieta Iba, RN Sent: 02/07/2021   2:53 PM EST To: Sueanne Margarita, MD, Freada Bergeron, CMA

## 2021-03-17 NOTE — Telephone Encounter (Signed)
Patient's husband is following up. He states the call got disconnected while taking to sleep study coordinator, going over results.

## 2021-03-18 ENCOUNTER — Other Ambulatory Visit: Payer: Self-pay | Admitting: Physician Assistant

## 2021-03-18 DIAGNOSIS — E032 Hypothyroidism due to medicaments and other exogenous substances: Secondary | ICD-10-CM

## 2021-03-18 LAB — TSH: TSH: 6.99 u[IU]/mL — ABNORMAL HIGH (ref 0.450–4.500)

## 2021-03-18 LAB — T4, FREE: Free T4: 0.79 ng/dL — ABNORMAL LOW (ref 0.82–1.77)

## 2021-03-18 MED ORDER — THYROID 15 MG PO TABS: 15.0000 mg | ORAL_TABLET | Freq: Every day | ORAL | 1 refills | Status: DC

## 2021-03-19 NOTE — Telephone Encounter (Addendum)
Sorry I meant to change to auto BiPAP with IPAP max 20cm H2O, EPAP min 5cm H2O and PS 4cm H2O and needs mask checked for leak   Order placed to [facility] via fax.

## 2021-03-19 NOTE — Addendum Note (Signed)
Addended by: Freada Bergeron on: 03/19/2021 11:54 AM   Modules accepted: Orders

## 2021-03-19 NOTE — Telephone Encounter (Signed)
Patient has a Airfit 10 F-30 small FF mask on a set pressure of 17. No chin strap [facility]) will work on the mask leak and mask fit

## 2021-03-20 ENCOUNTER — Other Ambulatory Visit: Payer: Self-pay | Admitting: Nurse Practitioner

## 2021-03-20 DIAGNOSIS — K219 Gastro-esophageal reflux disease without esophagitis: Secondary | ICD-10-CM

## 2021-03-21 ENCOUNTER — Other Ambulatory Visit: Payer: Self-pay | Admitting: Neurology

## 2021-03-21 DIAGNOSIS — Z20822 Contact with and (suspected) exposure to covid-19: Secondary | ICD-10-CM | POA: Diagnosis not present

## 2021-03-24 NOTE — Telephone Encounter (Signed)
Rx refilled.

## 2021-03-25 ENCOUNTER — Ambulatory Visit: Payer: Medicare Other | Admitting: Neurology

## 2021-03-25 DIAGNOSIS — R413 Other amnesia: Secondary | ICD-10-CM | POA: Diagnosis not present

## 2021-03-27 ENCOUNTER — Other Ambulatory Visit: Payer: Self-pay | Admitting: Physician Assistant

## 2021-04-01 ENCOUNTER — Telehealth: Payer: Self-pay | Admitting: Adult Health

## 2021-04-01 DIAGNOSIS — R4189 Other symptoms and signs involving cognitive functions and awareness: Secondary | ICD-10-CM

## 2021-04-01 NOTE — Telephone Encounter (Signed)
Received report from neurocognitive testing on 03/25/2021 by Dr. Eliezer Lofts [place] at [facility].   Based on summary and impressions, testing "suggestive of frontal-subcortical dysfunction as well as some indication of possible mesial temporal lobe dysfunction".  Recommended MRI of brain for "assessing relative mesial temporal lobe atrophy or possible sclerosis as well as a relative vascular burden on her brain.  Etiology of deficits uncertain but likely multifactorial with contribution from her several vascular risk factors as well as signs of potential early Alzheimer's pathology.  The course is likely to be progressive based on documented history of progressive decline over the last few years"   Prior [name] contrast completed 11/2018 which showed small remote right temporal occipital cortical infarct and generalized age-related cerebral atrophy with mild chronic small vessel ischemic disease. Due to decline of her memory since prior imaging completed, we could repeat an MRI brain if patient agrees to this as recommended by Dr. Cecelia Byars. If she would like to proceed, orders will be placed.

## 2021-04-02 ENCOUNTER — Telehealth: Payer: Self-pay | Admitting: Adult Health

## 2021-04-02 NOTE — Addendum Note (Signed)
Addended by: Frann Rider L on: 04/02/2021 11:20 AM   Modules accepted: Orders

## 2021-04-02 NOTE — Telephone Encounter (Signed)
Called patient, husband [name], on [place] answered, stated she was not home. I reviewed 2nd part of NP's message with him. He stated they go back to see Dr Cecelia Byars soon for the results of testing. He stated that they want to proceed with MRI, stated he knows his wife will agree, and he asked for MRI brain order to be placed. I advised will let NP know. He verbalized understanding, appreciation.

## 2021-04-02 NOTE — Telephone Encounter (Signed)
Medicare/tricare order sent to [facility], [facility] they will reach out to the patient to schedule.  °

## 2021-04-03 NOTE — Progress Notes (Signed)
Established patient visit   Patient: Patricia [name]   DOB: 1941-01-16   81 y.o. Female  MRN: [id number] Visit Date: 04/10/2021  Chief Complaint  Patient presents with   Follow-up   Hypertension   Hyperlipidemia   Thyroid Problem   Subjective    HPI  Patient presents for follow up on hypertension, hyperlipidemia and hypothyroid. Gabapentin 400 mg BID has helped with chronic cough. States medication was stopped to see if her cough remained stable but it came back.   HTN: Pt denies chest pain, palpitations, dizziness, shortness of breath or severe lower extremity swelling. States just took her water pill.Taking medication as directed without side effects. Does check BP at home occasionally and readings range 120s/85.  HLD: Pt taking medication as directed without problems. States tries to have a balanced diet.   Hypothyroidism: Taking new adjusted dose of armour thyroid 75 mg. Denies feeling more tired than usual, heat/cold intolerance of unintentional weight changes.  Medications: Outpatient Medications Prior to Visit  Medication Sig   ADVAIR HFA 230-21 MCG/ACT inhaler USE 2 INHALATIONS TWICE A DAY   albuterol (PROVENTIL HFA;VENTOLIN HFA) 108 (90 Base) MCG/ACT inhaler Inhale 2 puffs into the lungs every 6 (six) hours as needed for wheezing or shortness of breath.   apixaban (ELIQUIS) 5 MG TABS tablet TAKE 1 TABLET TWICE A DAY   Apoaequorin (PREVAGEN) 10 MG CAPS Take 1 capsule by mouth every morning.   ARMOUR THYROID 60 MG tablet TAKE 1 TABLET DAILY BEFORE BREAKFAST   atorvastatin (LIPITOR) 40 MG tablet TAKE 1 TABLET DAILY (Patient taking differently: Take 40 mg by mouth daily before supper.)   bimatoprost (LUMIGAN) 0.01 % SOLN Place 1 drop into both eyes 2 (two) times daily.   calcium-vitamin D (OSCAL WITH D) 500-200 MG-UNIT tablet Take 1 tablet by mouth daily with breakfast.   diltiazem (CARDIZEM CD) 300 MG 24 hr capsule TAKE 1 CAPSULE DAILY   dorzolamide-timolol (COSOPT) 22.3-6.8  MG/ML ophthalmic solution Place 1 drop into both eyes at bedtime.   fluticasone (FLONASE) 50 MCG/ACT nasal spray 1 spray each nostril following sinus rinses twice daily   furosemide (LASIX) 40 MG tablet Take 1 tablet (40 mg total) by mouth 2 (two) times daily.   gabapentin (NEURONTIN) 400 MG capsule TAKE 1 CAPSULE TWICE A DAY   L-Methylfolate-B12-B6-B2 (CEREFOLIN) 07-31-48-5 MG TABS TAKE 1 TABLET BY MOUTH EVERY MORNING   levETIRAcetam (KEPPRA XR) 500 MG 24 hr tablet TAKE 1 TABLET DAILY (Patient taking differently: Take 500 mg by mouth every evening.)   loratadine (CLARITIN) 10 MG tablet Take 10 mg by mouth daily.   memantine (NAMENDA) 10 MG tablet Take 1 tablet (10 mg total) by mouth 2 (two) times daily.   metoprolol succinate (TOPROL-XL) 50 MG 24 hr tablet Take 1.5 tablets (75 mg total) by mouth daily. Take with or immediately following a meal. (Patient taking differently: Take 75 mg by mouth every evening. Take with or immediately following a meal.)   Multiple Vitamins-Iron (MULTIVITAMIN/IRON) TABS Take 1 tablet by mouth daily.   Netarsudil Dimesylate (RHOPRESSA) 0.02 % SOLN Place 1 drop into both eyes at bedtime.   pantoprazole (PROTONIX) 40 MG tablet TAKE 1 TABLET DAILY   potassium chloride SA (KLOR-CON) 20 MEQ tablet TAKE 1 TABLET TWICE A DAY (KEEP UPCOMING APPOINTMENT IN SEPTEMBER WITH DR TURNER BEFORE ANYMORE REFILLS)   thyroid (ARMOUR THYROID) 15 MG tablet Take 1 tablet (15 mg total) by mouth daily. Take along with 60 mg.   Vitamin D, Ergocalciferol, (DRISDOL)  1.25 MG (50000 UNIT) CAPS capsule Take one capsule weekly (Patient taking differently: Take 50,000 Units by mouth every 7 (seven) days. Sunday)   memantine (NAMENDA TITRATION PAK) tablet pack 5 mg/day for =1 week; 5 mg twice daily for =1 week; 15 mg/day given in 5 mg and 10 mg separated doses for =1 week; then 10 mg twice daily   Facility-Administered Medications Prior to Visit  Medication Dose Route Frequency Provider   denosumab  (PROLIA) injection 60 mg  60 mg Subcutaneous Once Magrinat, Virgie Dad, MD    Review of Systems     Objective    BP 114/71    Pulse 96    Temp (!) 97.2 F (36.2 C)    Ht 5' (1.524 m)    Wt 141 lb (64 kg)    SpO2 90%    BMI 27.54 kg/m  BP Readings from Last 3 Encounters:  04/10/21 114/71  02/04/21 115/81  02/03/21 120/70   Wt Readings from Last 3 Encounters:  04/10/21 141 lb (64 kg)  02/04/21 145 lb (65.8 kg)  02/03/21 147 lb 12.8 oz (67 kg)    Physical Exam  General:  Well Developed, well nourished, appropriate for stated age.  Neuro:  Alert and oriented,  extra-ocular muscles intact  HEENT:  Normocephalic, atraumatic, neck supple  Skin:  no gross rash, warm, pink. Cardiac:  Irregular rhythm, no murmur  Respiratory: CTA B/L w/o wheezing, rhonchi or rales. Vascular:  Ext warm, no cyanosis apprec.; cap RF less 2 sec. 1+ edema RLE, trace edema LLE Psych:  No HI/SI, judgement and insight good, Euthymic mood. Full Affect.   No results found for any visits on 04/10/21.  Assessment & Plan      Problem List Items Addressed This Visit       Cardiovascular and Mediastinum   Essential hypertension - Primary (Chronic)    - BP today is at goal. - Continue current medication regimen. See med list. - Discussed low salt diet. - Encourage to stay as active as possible. - Will continue to monitor.       Persistent atrial fibrillation (HCC) (Chronic)    -Followed by cardiology.        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 Patient is schedule to repeat thyroid labs 04/22/2020. Pending results will make additional medication adjustments if indicated.         Nervous and Auditory   Seizure disorder ([place])- txed by [facility]ie [facility]- Neuro    -Followed by Neurology.        Other   Hyperlipidemia (Chronic)    -Last lipid panel wnl's. LDL 62 -Continue current medication regimen. -Will repeat lipid panel and  hepatic function with [facility]. -Will continue to monitor.      Chronic cough (Chronic)   [facility]: -Patient has been evaluated by neuropsychology (Dr. Eliezer Lofts [place]) with recommendation for neuroimaging which neurology has placed. -Patient on [place].  Chronic cough: -Improved and stable. -Continue current medication regimen.    Return in about 6 months (around 10/08/2021) for [place] and [place].        Lorrene Reid, PA-C  [facility] at [facility] [phone] (phone) [phone] (fax)  [place]

## 2021-04-07 DIAGNOSIS — R413 Other amnesia: Secondary | ICD-10-CM | POA: Diagnosis not present

## 2021-04-10 ENCOUNTER — Other Ambulatory Visit: Payer: Self-pay

## 2021-04-10 ENCOUNTER — Encounter: Payer: Self-pay | Admitting: Physician Assistant

## 2021-04-10 ENCOUNTER — Encounter: Payer: Self-pay | Admitting: Oncology

## 2021-04-10 ENCOUNTER — Ambulatory Visit (INDEPENDENT_AMBULATORY_CARE_PROVIDER_SITE_OTHER): Payer: Medicare Other | Admitting: Physician Assistant

## 2021-04-10 VITALS — BP 114/71 | HR 96 | Temp 97.2°F | Ht 60.0 in | Wt 141.0 lb

## 2021-04-10 DIAGNOSIS — G3184 Mild cognitive impairment, so stated: Secondary | ICD-10-CM | POA: Diagnosis not present

## 2021-04-10 DIAGNOSIS — E032 Hypothyroidism due to medicaments and other exogenous substances: Secondary | ICD-10-CM | POA: Diagnosis not present

## 2021-04-10 DIAGNOSIS — I4819 Other persistent atrial fibrillation: Secondary | ICD-10-CM

## 2021-04-10 DIAGNOSIS — R053 Chronic cough: Secondary | ICD-10-CM

## 2021-04-10 DIAGNOSIS — E785 Hyperlipidemia, unspecified: Secondary | ICD-10-CM | POA: Diagnosis not present

## 2021-04-10 DIAGNOSIS — G40909 Epilepsy, unspecified, not intractable, without status epilepticus: Secondary | ICD-10-CM | POA: Diagnosis not present

## 2021-04-10 DIAGNOSIS — I10 Essential (primary) hypertension: Secondary | ICD-10-CM | POA: Diagnosis not present

## 2021-04-10 NOTE — Assessment & Plan Note (Signed)
-  Last lipid panel wnl's. LDL 62 -Continue current medication regimen. -Will repeat lipid panel and hepatic function with [facility]. -Will continue to monitor.

## 2021-04-10 NOTE — Assessment & Plan Note (Signed)
Followed by cardiology 

## 2021-04-10 NOTE — Patient Instructions (Signed)

## 2021-04-10 NOTE — Assessment & Plan Note (Signed)
-   BP today is at goal. - Continue current medication regimen. See med list. - Discussed low salt diet. - Encourage to stay as active as possible. - Will continue to monitor.

## 2021-04-10 NOTE — Assessment & Plan Note (Signed)
-  Followed by Neurology.

## 2021-04-10 NOTE — Assessment & Plan Note (Signed)
-  Armour thyroid recently increased from 60 mg to 75 mg. Patient is schedule to repeat thyroid labs 04/22/2020. Pending results will make additional medication adjustments if indicated.

## 2021-04-16 NOTE — Progress Notes (Signed)
Cardiology Office Note:    Date:  04/17/2021   ID:  Patricia [name], DOB 1940-12-29, MRN [id number]  PCP:  Lorrene Reid, PA-C  Cardiologist:  Fransico Him, MD   Referring MD: Lorrene Reid, PA-C   Chief Complaint  Patient presents with   Congestive Heart Failure   Aortic Insuffiency   Hypertension   Atrial Fibrillation   Mitral Regurgitation   Sleep Apnea    History of Present Illness:    Patricia [name] is a 81 y.o. female with history of chronic AFib on Eliquis (amiodarone stopped because of prolonged QT), CVA, chronic diastolic CHF, mild  AI, normal NST 2017, lung CA resection 2003, breast CA s/p lumpectomy/Tamoxifen/XRT, HTN, HLD.  2D echo in 2018 showed moderate PHTN with PASP 68mmHg and RHC 04/2016 with borderline pulmonary HTN.  Repeat 2D echo 12/2019 showed normal LVF with mild to moderate MR and normal PASP.  She is followed in afib clinic and now permanent atrial fibrillation.    She underwent sleep study in Nov 2021 and was found to have mild OSA with an AHI of 9.6/hr with no central apneas and underwent CPAP titration but could not be adequately treated due to ongoing events and underwent BiPAP titration and was ultimately placed on BIPAP.   She recently had a 2D echocardiogram done for follow-up of her mitral regurgitation which showed normal LV size and systolic function, mild RV enlargement and moderate right atrial enlargement as well as moderate to severe MR and TR which are new.  She underwent TEE on 01/07/2021 showing low normal LV function with EF 50 to 55% with severe left atrial enlargement, severe right atrial enlargement and moderate to severe mitral regurgitation with myxomatous tricuspid valve and moderate TR.   When I saw her last she was having problems with increased volume overload with worsening lower extremity edema as well as orthopnea and shortness of breath.  Given her significant MR she was referred to structural heart clinic and was seen by Dr. Burt Knack who  felt she had moderate to severe 3+ mitral regurgitation due to a combination of atrial functional MR with marked left atrial enlargement as well as degenerative mitral regurgitation with severe calcification of the P2 scallop of the posterior leaflet.  At that office visit she was NYHA functional class I-II with fatigue.  It was felt she was not a candidate for transcatheter therapies because of severe posterior leaflet calcification.  Ongoing medical therapy was recommended at that time given lack of significant symptoms.  She has been continued on annual echo reassessments.  She she is here today for followup and is doing well.  She denies any chest pain or pressure, SOB, DOE, PND, orthopnea, LE edema, dizziness, palpitations or syncope.  She is compliant with her meds and is tolerating meds with no SE.     She is doing well with her CPAP device and thinks that she has gotten used to it.  She tolerates the mask and feels the pressure is adequate.  Since going on CPAP she feels rested in the am and has no significant daytime sleepiness.  She denies any significant mouth or nasal dryness or nasal congestion.  She does not think that he snores.     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 Dx 04/2015 at time of stroke. b. DCCV 06/2015 - did not hold. Amio started then stopped due to QT prolongation.   Chronic cough    Chronic diastolic heart failure ([place]) 06/17/2015   CKD (chronic kidney disease), stage III (HCC)    Cough variant asthma    Disease of pharynx or nasopharynx    Diverticulosis    Enlargement of right atrium 01/23/2020   Essential hypertension    GERD (gastroesophageal reflux disease)    Glaucoma    Hiatal hernia    Hyperlipidemia    Internal hemorrhoids     Laryngospasm    Mitral regurgitation    mild to moderate by echo 12/2019 and moderate by TEE 12/2020   Multinodular goiter    Osteopenia    Postmenopausal    Pulmonary HTN (HCC)     Moderate with PASP 73mmHg by echo 2018 likely Group 2 from pulmonary venous HTN from CHF>>normal PAP by echo 12/2019   Radiation 10/22/14-11/23/14   Left Breast   Stricture and stenosis of cervix     Past Surgical History:  Procedure Laterality Date   BREAST LUMPECTOMY Left    BREAST SURGERY     CARDIOVERSION N/A 06/24/2015   Procedure: CARDIOVERSION;  Surgeon: Sueanne Margarita, MD;  Location: [facility];  Service: Cardiovascular;  Laterality: N/A;   CARDIOVERSION N/A 05/19/2016   Procedure: CARDIOVERSION;  Surgeon: Sueanne Margarita, MD;  Location: [facility];  Service: Cardiovascular;  Laterality: N/A;   COLONOSCOPY     LUNG CANCER SURGERY  2003   resection carcinoid lingula-lt upper lobe   RADIOACTIVE SEED GUIDED PARTIAL MASTECTOMY WITH AXILLARY SENTINEL LYMPH NODE BIOPSY Left 06/26/2014   Procedure: RADIOACTIVE SEED GUIDED PARTIAL MASTECTOMY WITH AXILLARY SENTINEL LYMPH NODE BIOPSY;  Surgeon: Stark Klein, MD;  Location: [facility];  Service: General;  Laterality: Left;   RE-EXCISION OF BREAST LUMPECTOMY Left 08/07/2014   Procedure: RE-EXCISION OF LEFT BREAST LUMPECTOMY;  Surgeon: Stark Klein, MD;  Location: [facility];  Service: General;  Laterality: Left;   RIGHT HEART CATH N/A 04/27/2016   Procedure: Right Heart Cath;  Surgeon: Larey Dresser, MD;  Location: [facility];  Service: Cardiovascular;  Laterality: N/A;   TEE WITHOUT CARDIOVERSION N/A 01/07/2021   Procedure: TRANSESOPHAGEAL ECHOCARDIOGRAM (TEE);  Surgeon: Thayer Headings, MD;  Location[facility];  Service: Cardiovascular;  Laterality: N/A;    Current Medications: Current Meds  Medication Sig   ADVAIR HFA 230-21 MCG/ACT inhaler USE 2 INHALATIONS TWICE A DAY   albuterol (PROVENTIL HFA;VENTOLIN HFA)  108 (90 Base) MCG/ACT inhaler Inhale 2 puffs into the lungs every 6 (six) hours as needed for wheezing or shortness of breath.   apixaban (ELIQUIS) 5 MG TABS tablet TAKE 1 TABLET TWICE A DAY   Apoaequorin (PREVAGEN) 10 MG CAPS Take 1 capsule by mouth every morning.   ARMOUR THYROID 60 MG tablet TAKE 1 TABLET DAILY BEFORE BREAKFAST   atorvastatin (LIPITOR) 40 MG tablet TAKE 1 TABLET DAILY (Patient taking differently: Take 40 mg by mouth daily before supper.)   bimatoprost (LUMIGAN) 0.01 % SOLN Place 1 drop into both eyes 2 (two) times daily.   calcium-vitamin D (OSCAL WITH D) 500-200 MG-UNIT tablet Take 1 tablet by mouth daily with breakfast.   diltiazem (CARDIZEM CD) 300 MG 24 hr capsule TAKE 1 CAPSULE DAILY   dorzolamide-timolol (COSOPT) 22.3-6.8 MG/ML ophthalmic solution Place 1 drop into both  eyes at bedtime.   fluticasone (FLONASE) 50 MCG/ACT nasal spray 1 spray each nostril following sinus rinses twice daily   furosemide (LASIX) 40 MG tablet Take 1 tablet (40 mg total) by mouth 2 (two) times daily.   gabapentin (NEURONTIN) 400 MG capsule TAKE 1 CAPSULE TWICE A DAY   L-Methylfolate-B12-B6-B2 (CEREFOLIN) 07-31-48-5 MG TABS TAKE 1 TABLET BY MOUTH EVERY MORNING   levETIRAcetam (KEPPRA XR) 500 MG 24 hr tablet TAKE 1 TABLET DAILY (Patient taking differently: Take 500 mg by mouth every evening.)   loratadine (CLARITIN) 10 MG tablet Take 10 mg by mouth daily.   memantine (NAMENDA) 10 MG tablet Take 1 tablet (10 mg total) by mouth 2 (two) times daily.   metoprolol succinate (TOPROL-XL) 50 MG 24 hr tablet Take 1.5 tablets (75 mg total) by mouth daily. Take with or immediately following a meal. (Patient taking differently: Take 75 mg by mouth every evening. Take with or immediately following a meal.)   Multiple Vitamins-Iron (MULTIVITAMIN/IRON) TABS Take 1 tablet by mouth daily.   Netarsudil Dimesylate (RHOPRESSA) 0.02 % SOLN Place 1 drop into both eyes at bedtime.   pantoprazole (PROTONIX) 40 MG tablet  TAKE 1 TABLET DAILY   potassium chloride SA (KLOR-CON) 20 MEQ tablet TAKE 1 TABLET TWICE A DAY (KEEP UPCOMING APPOINTMENT IN SEPTEMBER WITH DR Patra Gherardi BEFORE ANYMORE REFILLS)   thyroid (ARMOUR THYROID) 15 MG tablet Take 1 tablet (15 mg total) by mouth daily. Take along with 60 mg.   Vitamin D, Ergocalciferol, (DRISDOL) 1.25 MG (50000 UNIT) CAPS capsule Take one capsule weekly (Patient taking differently: Take 50,000 Units by mouth every 7 (seven) days. Sunday)     Allergies:   Combigan [brimonidine tartrate-timolol], Other, Sulfa antibiotics, and Sulfamethoxazole-trimethoprim   Social History   Socioeconomic History   Marital status: Married    Spouse name: [name]   Number of children: Not on file   Years of education: Not on file   Highest education level: Not on file  Occupational History   Not on file  Tobacco Use   Smoking status: Former    Packs/day: 0.75    Years: 5.00    Pack years: 3.75    Types: Cigarettes    Quit date: 06/15/1966    Years since quitting: 54.8   Smokeless tobacco: Never  Vaping Use   Vaping Use: Never used  Substance and Sexual Activity   Alcohol use: Yes    Alcohol/week: 0.0 standard drinks    Comment: occasional wine   Drug use: No   Sexual activity: Not Currently  Other Topics Concern   Not on file  Social History Narrative   Retired. Lives with husband.    Right Handed   Drinks 1-2 cups caffeine daily   Social Determinants of Health   Financial Resource Strain: Not on file  Food Insecurity: Not on file  Transportation Needs: Not on file  Physical Activity: Not on file  Stress: Not on file  Social Connections: Not on file     Family History: The patient's family history includes Atrial fibrillation in her son; Hyperlipidemia in her father and mother; Hypertension in her father and mother; Stroke in her father and mother; Transient ischemic attack in her father. There is no history of Heart attack.  ROS:   Please see the history of  present illness.    ROS  All other systems reviewed and negative.   EKGs/Labs/Other Studies Reviewed:    The following studies were reviewed today:  2D echo 12/2019  IMPRESSIONS    1. Left ventricular ejection fraction, by estimation, is 55 to 60%. The  left ventricle has normal function. The left ventricle has no regional  wall motion abnormalities. There is mild left ventricular hypertrophy.  Left ventricular diastolic parameters  are indeterminate.   2. Right ventricular systolic function is mildly reduced. The right  ventricular size is normal. There is mildly elevated pulmonary artery  systolic pressure.   3. Left atrial size was severely dilated.   4. Right atrial size was severely dilated.   5. The mitral valve is normal in structure. Mild to moderate mitral valve  regurgitation. ERO 0.19 cm^2, RV 23 cc. Moderate mitral annular  calcification.   6. Tricuspid valve regurgitation is moderate.   7. The aortic valve is tricuspid. Aortic valve regurgitation is mild.  Mild to moderate aortic valve sclerosis/calcification is present, without  any evidence of aortic stenosis.   2D echo 11/2020 IMPRESSIONS    1. Left ventricular ejection fraction, by estimation, is 60 to 65%. The  left ventricle has normal function. The left ventricle has no regional  wall motion abnormalities. Left ventricular diastolic function could not  be evaluated. There is the  interventricular septum is flattened in systole and diastole, consistent  with right ventricular pressure and volume overload.   2. Right ventricular systolic function is mildly reduced. The right  ventricular size is mildly enlarged. There is moderately elevated  pulmonary artery systolic pressure. The estimated right ventricular  systolic pressure is 34.1 mmHg.   3. Left atrial size was severely dilated.   4. Right atrial size was severely dilated.   5. The mitral valve is degenerative. Moderate to severe mitral valve   regurgitation. No evidence of mitral stenosis.   6. The tricuspid valve is abnormal. Tricuspid valve regurgitation is  moderate to severe.   7. The aortic valve is tricuspid. Aortic valve regurgitation is mild. No  aortic stenosis is present. Aortic regurgitation PHT measures 884 msec.   8. The inferior vena cava is normal in size with greater than 50%  respiratory variability, suggesting right atrial pressure of 3 mmHg.   Comparison(s): Changes from prior study are noted. MR/TR appear to be  moderate to severe on this study. LV function unchanged. AI remains mild.   EKG:  EKG is not ordered today.  Recent Labs: 09/30/2020: ALT 20 01/15/2021: Hemoglobin 15.0; NT-Pro BNP 1,773; Platelets 295 01/22/2021: BUN 24; Creatinine, Ser 1.25; Potassium 4.2; Sodium 143 03/17/2021: TSH 6.990   Recent Lipid Panel    Component Value Date/Time   CHOL 134 09/30/2020 0831   TRIG 49 09/30/2020 0831   TRIG 52 01/13/2006 1012   HDL 61 09/30/2020 0831   CHOLHDL 2.2 09/30/2020 0831   CHOLHDL 3 04/01/2016 0952   VLDL 19.2 04/01/2016 0952   LDLCALC 62 09/30/2020 0831   LDLDIRECT 174.3 06/04/2009 0000    Physical Exam:    VS:  BP 106/62    Pulse 89    Ht 5' (1.524 m)    Wt 146 lb (66.2 kg)    SpO2 92%    BMI 28.51 kg/m     Wt Readings from Last 3 Encounters:  04/17/21 146 lb (66.2 kg)  04/10/21 141 lb (64 kg)  02/04/21 145 lb (65.8 kg)    GEN: Well nourished, well developed in no acute distress HEENT: Normal NECK: No JVD; No carotid bruits LYMPHATICS: No lymphadenopathy CARDIAC:irregularly irregular, no rubs, gallops RESPIRATORY:  Clear to auscultation without rales, wheezing or rhonchi  ABDOMEN: Soft, non-tender, non-distended MUSCULOSKELETAL:  trace edema; No deformity  SKIN: Warm and dry NEUROLOGIC:  Alert and oriented x 3 PSYCHIATRIC:  Normal affect   ASSESSMENT:    1. Chronic diastolic heart failure ([place])   2. Nonrheumatic aortic valve insufficiency   3. Essential hypertension    4. Permanent atrial fibrillation ([place])   5. Pulmonary HTN ([place])   6. Nonrheumatic mitral valve regurgitation   7. OSA (obstructive sleep apnea)   8. SOB (shortness of breath)    PLAN:    In order of problems listed above:  1.  Chronic diastolic CHF  -She does not appear volume overloaded on exam today -I have personally reviewed and interpreted outside labs performed by patient's PCP which showed SCr 1.25 and K+ 4.2 on 01/22/2021  -Continue prescription drug management with Lasix 60 mg daily with as needed refills  2.  Aortic insufficiency -mild AI by echo 11/2020  3.  HTN  -BP is well controlled on exam today -Continue prescription drug management Cardizem CD 300 mg daily and Toprol-XL 75 mg daily with as needed refills  4.  Permanent atrial fibrillation  -she is asymptomatic and did not hold in NSR after DCCV due to severely dilated LA -Her LA is severely dilated so not a candidate for ablation.   -She did not tolerate amio due to prolonged QT so likely would happen with other Class III AAD.   -She denies any palpitations and heart rate is adequately controlled on exam today when at rest but then became very fast when getting up on the exam table -Continue prescription drug management Toprol-XL 75 mg daily and Eliquis 5 mg twice daily for CHA2DS2-VASc score of 5 with as needed refills -3 day ziopatch to assess HR control  5.  Pulmonary HTN  - RHC with no significant pulmonary HTN with mean PAP 64mmHg and PCWP elevated at 10mmHg in the setting of afib.   -2D echo 12/2019 with no PHTN but recent echo showed mildly reduced RV function and mild right ventricular enlargement as well as moderate pulmonary hypertension moderate to severe MR as well as TR. PASP 46.9 mmHg -Continue diuretic therapy  6.  Mitral Regurgitation -mild to moderate by echo 12/2019 and moderate to severe by echo 11/2020 -TEE confirmed moderate to severe mitral regurgitation but EF appeared to be slightly  reduced at 50 to 55%. -She was evaluated by Dr. Burt Knack in structural heart clinic and felt to have a combination of atrial functional MR with moderate left atrial enlargement as well as degenerative mitral regurgitation with severe calcification of the P2 scallop of the posterior leaflet.  Medical therapy ongoing was recommended given her low NYHA functional class I-II symptoms with fatigue. -She was not felt to be a candidate for transcatheter therapies such as edge-to-edge repair of the mitral valve due to posterior leaflet calcification that was severe.  Only options if needed would be clinical trial devices or cardiac surgical repair or replacement. -2D echo May 2023  7.  OSA - The patient is tolerating PAP therapy well without any problems. The PAP download performed by his DME was personally reviewed and interpreted by me today and showed an AHI of 14.8 /hr on 17/11 cm H2O with 60% compliance in using more than 4 hours nightly.  The patient has been using and benefiting from PAP use and will continue to benefit from therapy.  -I am going to change her to auto BiPAP with an IPAP max of 20 cm H2O,  EPAP min of 5 cm H2O pressure support of 4 cm H2O -I encouraged her to be more compliant with her device -We will repeat download in 4 weeks -order supplies>>I have recommended that she change the cushion out every 6 weeks to avoid significant mask leakage which can contribute to increased AHI   8.  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  Concerns regarding medicines are outlined above.  No orders of the defined types were placed in this encounter.   No orders of the defined types were placed in this encounter.    Signed, Fransico Him, MD  04/17/2021 10:05 AM    [place]

## 2021-04-17 ENCOUNTER — Encounter: Payer: Self-pay | Admitting: Cardiology

## 2021-04-17 ENCOUNTER — Ambulatory Visit (INDEPENDENT_AMBULATORY_CARE_PROVIDER_SITE_OTHER): Payer: Medicare Other | Admitting: Cardiology

## 2021-04-17 ENCOUNTER — Other Ambulatory Visit: Payer: Self-pay

## 2021-04-17 ENCOUNTER — Ambulatory Visit (INDEPENDENT_AMBULATORY_CARE_PROVIDER_SITE_OTHER): Payer: Medicare Other

## 2021-04-17 VITALS — BP 106/62 | HR 89 | Ht 60.0 in | Wt 146.0 lb

## 2021-04-17 DIAGNOSIS — I351 Nonrheumatic aortic (valve) insufficiency: Secondary | ICD-10-CM | POA: Diagnosis not present

## 2021-04-17 DIAGNOSIS — I272 Pulmonary hypertension, unspecified: Secondary | ICD-10-CM | POA: Diagnosis not present

## 2021-04-17 DIAGNOSIS — G4733 Obstructive sleep apnea (adult) (pediatric): Secondary | ICD-10-CM | POA: Diagnosis not present

## 2021-04-17 DIAGNOSIS — I340 Nonrheumatic mitral (valve) insufficiency: Secondary | ICD-10-CM

## 2021-04-17 DIAGNOSIS — I10 Essential (primary) hypertension: Secondary | ICD-10-CM | POA: Diagnosis not present

## 2021-04-17 DIAGNOSIS — I4821 Permanent atrial fibrillation: Secondary | ICD-10-CM

## 2021-04-17 DIAGNOSIS — R0602 Shortness of breath: Secondary | ICD-10-CM

## 2021-04-17 DIAGNOSIS — I5032 Chronic diastolic (congestive) heart failure: Secondary | ICD-10-CM

## 2021-04-17 NOTE — Addendum Note (Signed)
Addended by: Molli Barrows on: 04/17/2021 10:27 AM   Modules accepted: Orders

## 2021-04-17 NOTE — Patient Instructions (Addendum)
Medication Instructions:  Your physician recommends that you continue on your current medications as directed. Please refer to the Current Medication list given to you today.  *If you need a refill on your cardiac medications before your next appointment, please call your pharmacy*   Testing/Procedures: Your physician has requested that you have an echocardiogram in May 2023. Echocardiography is a painless test that uses sound waves to create images of your heart. It provides your doctor with information about the size and shape of your heart and how well your hearts chambers and valves are working. This procedure takes approximately one hour. There are no restrictions for this procedure.   Your physician has recommended that you wear an event monitor. Event monitors are medical devices that record the hearts electrical activity. Doctors most often [name] these monitors to diagnose arrhythmias. Arrhythmias are problems with the speed or rhythm of the heartbeat. The monitor is a small, portable device. You can wear one while you do your normal daily activities. This is usually used to diagnose what is causing palpitations/syncope (passing out).   Follow-Up: At [facility], you and your health needs are our priority.  As part of our continuing mission to provide you with exceptional heart care, we have created designated Provider Care Teams.  These Care Teams include your primary Cardiologist (physician) and Advanced Practice Providers (APPs -  Physician Assistants and Nurse Practitioners) who all work together to provide you with the care you need, when you need it.  We recommend signing up for the patient portal called "MyChart".  Sign up information is provided on this After Visit Summary.  MyChart is used to connect with patients for Virtual Visits (Telemedicine).  Patients are able to view lab/test results, encounter notes, upcoming appointments, etc.  Non-urgent messages can be sent to your  provider as well.   To learn more about what you can do with MyChart, go to [email or web address].    Your next appointment:   6 month(s)  The format for your next appointment:   In Person  Provider:   Fransico Him, MD     Other Instructions ZIO XT- Long Term Monitor Instructions  Your physician has requested you wear a ZIO patch monitor for 3 days.  This is a single patch monitor. [employer] supplies one patch monitor per enrollment. Additional stickers are not available. Please do not apply patch if you will be having a Nuclear Stress Test,  Echocardiogram, Cardiac CT, MRI, or Chest Xray during the period you would be wearing the  monitor. The patch cannot be worn during these tests. You cannot remove and re-apply the  ZIO XT patch monitor.  Your ZIO patch monitor will be mailed 3 day USPS to your address on file. It may take 3-5 days  to receive your monitor after you have been enrolled.  Once you have received your monitor, please review the enclosed instructions. Your monitor  has already been registered assigning a specific monitor serial # to you.  Billing and Patient Assistance Program Information  We have supplied [employer] with any of your insurance information on file for billing purposes. [employer] offers a sliding scale Patient Assistance Program for patients that do not have  insurance, or whose insurance does not completely cover the cost of the ZIO monitor.  You must apply for the Patient Assistance Program to qualify for this discounted rate.  To apply, please call [employer] at [phone], select option 4, select option 2, ask to apply for  Patient Assistance  Program. Theodore Demark will ask your household income, and how many people  are in your household. They will quote your out-of-pocket cost based on that information.  [employer] will also be able to set up a 75-month, interest-free payment plan if needed.  Applying the monitor   Shave hair from upper left chest.  Hold  abrader disc by orange tab. Rub abrader in 40 strokes over the upper left chest as  indicated in your monitor instructions.  Clean area with 4 enclosed alcohol pads. Let dry.  Apply patch as indicated in monitor instructions. Patch will be placed under collarbone on left  side of chest with arrow pointing upward.  Rub patch adhesive wings for 2 minutes. Remove white label marked "1". Remove the white  label marked "2". Rub patch adhesive wings for 2 additional minutes.  While looking in a mirror, press and release button in center of patch. A small green light will  flash 3-4 times. This will be your only indicator that the monitor has been turned on.  Do not shower for the first 24 hours. You may shower after the first 24 hours.  Press the button if you feel a symptom. You will hear a small click. Record Date, Time and  Symptom in the Patient Logbook.  When you are ready to remove the patch, follow instructions on the last 2 pages of Patient  Logbook. Stick patch monitor onto the last page of Patient Logbook.  Place Patient Logbook in the blue and white box. Use locking tab on box and tape box closed  securely. The blue and white box has prepaid postage on it. Please place it in the mailbox as  soon as possible. Your physician should have your test results approximately 7 days after the  monitor has been mailed back to [facility].  Call [place] at [phone] if you have questions regarding  your ZIO XT patch monitor. Call them immediately if you see an orange light blinking on your  monitor.  If your monitor falls off in less than 4 days, contact our Monitor department at [phone].  If your monitor becomes loose or falls off after 4 days call [employer] at [phone] for  suggestions on securing your monitor

## 2021-04-17 NOTE — Progress Notes (Unsigned)
Enrolled for [employer] to mail a ZIO XT long term holter monitor to the patients address on file.  

## 2021-04-18 ENCOUNTER — Other Ambulatory Visit: Payer: Self-pay

## 2021-04-18 DIAGNOSIS — E032 Hypothyroidism due to medicaments and other exogenous substances: Secondary | ICD-10-CM

## 2021-04-18 DIAGNOSIS — R7989 Other specified abnormal findings of blood chemistry: Secondary | ICD-10-CM

## 2021-04-19 ENCOUNTER — Other Ambulatory Visit: Payer: Self-pay | Admitting: Physician Assistant

## 2021-04-19 DIAGNOSIS — E032 Hypothyroidism due to medicaments and other exogenous substances: Secondary | ICD-10-CM

## 2021-04-21 ENCOUNTER — Other Ambulatory Visit: Payer: Self-pay

## 2021-04-21 DIAGNOSIS — E032 Hypothyroidism due to medicaments and other exogenous substances: Secondary | ICD-10-CM

## 2021-04-21 MED ORDER — THYROID 15 MG PO TABS: 15.0000 mg | ORAL_TABLET | Freq: Every day | ORAL | 1 refills | Status: DC

## 2021-04-22 ENCOUNTER — Other Ambulatory Visit: Payer: Self-pay

## 2021-04-22 ENCOUNTER — Other Ambulatory Visit: Payer: Medicare Other

## 2021-04-22 DIAGNOSIS — E032 Hypothyroidism due to medicaments and other exogenous substances: Secondary | ICD-10-CM | POA: Diagnosis not present

## 2021-04-22 DIAGNOSIS — G4733 Obstructive sleep apnea (adult) (pediatric): Secondary | ICD-10-CM

## 2021-04-22 DIAGNOSIS — I272 Pulmonary hypertension, unspecified: Secondary | ICD-10-CM | POA: Diagnosis not present

## 2021-04-22 DIAGNOSIS — I10 Essential (primary) hypertension: Secondary | ICD-10-CM

## 2021-04-22 DIAGNOSIS — R7989 Other specified abnormal findings of blood chemistry: Secondary | ICD-10-CM | POA: Diagnosis not present

## 2021-04-22 DIAGNOSIS — I351 Nonrheumatic aortic (valve) insufficiency: Secondary | ICD-10-CM | POA: Diagnosis not present

## 2021-04-22 DIAGNOSIS — I4821 Permanent atrial fibrillation: Secondary | ICD-10-CM | POA: Diagnosis not present

## 2021-04-22 DIAGNOSIS — I340 Nonrheumatic mitral (valve) insufficiency: Secondary | ICD-10-CM | POA: Diagnosis not present

## 2021-04-22 DIAGNOSIS — I5032 Chronic diastolic (congestive) heart failure: Secondary | ICD-10-CM | POA: Diagnosis not present

## 2021-04-22 DIAGNOSIS — R0602 Shortness of breath: Secondary | ICD-10-CM

## 2021-04-23 DIAGNOSIS — M25552 Pain in left hip: Secondary | ICD-10-CM | POA: Diagnosis not present

## 2021-04-23 DIAGNOSIS — M545 Low back pain, unspecified: Secondary | ICD-10-CM | POA: Diagnosis not present

## 2021-04-23 LAB — T4, FREE: Free T4: 0.94 ng/dL (ref 0.82–1.77)

## 2021-04-23 LAB — TSH: TSH: 5.32 u[IU]/mL — ABNORMAL HIGH (ref 0.450–4.500)

## 2021-04-24 NOTE — Telephone Encounter (Signed)
I have been using a ResMed F-20 that was used during one of the 2 Sleep Test s at [facility].

## 2021-04-30 ENCOUNTER — Telehealth: Payer: Self-pay | Admitting: Cardiology

## 2021-04-30 ENCOUNTER — Telehealth: Payer: Self-pay | Admitting: *Deleted

## 2021-04-30 DIAGNOSIS — M6281 Muscle weakness (generalized): Secondary | ICD-10-CM | POA: Diagnosis not present

## 2021-04-30 DIAGNOSIS — M461 Sacroiliitis, not elsewhere classified: Secondary | ICD-10-CM | POA: Diagnosis not present

## 2021-04-30 DIAGNOSIS — M2560 Stiffness of unspecified joint, not elsewhere classified: Secondary | ICD-10-CM | POA: Diagnosis not present

## 2021-04-30 DIAGNOSIS — G4733 Obstructive sleep apnea (adult) (pediatric): Secondary | ICD-10-CM

## 2021-04-30 MED ORDER — FUROSEMIDE 40 MG PO TABS: 40.0000 mg | ORAL_TABLET | Freq: Two times a day (BID) | ORAL | 3 refills | Status: DC

## 2021-04-30 NOTE — Telephone Encounter (Signed)
Bipap pressure changed by the modem. Cpap supplies order sent to [facility]. ?

## 2021-04-30 NOTE — Telephone Encounter (Signed)
?*  STAT* If patient is at the pharmacy, call can be transferred to refill team. ? ? ?1. Which medications need to be refilled? (please list name of each medication and dose if known)  ?furosemide (LASIX) 40 MG tablet ? ?2. Which pharmacy/location (including street and city if local pharmacy) is medication to be sent to? ?[place], [place] ? ?3. Do they need a 30 day or 90 day supply?  ?90 day supply ? ?Patient's husband states the patient's prescription was sent in incorrectly. He states it should have been for 40 MG by mouth twice daily instead of once daily. I don't see this Rx. Please advise. ? ?

## 2021-04-30 NOTE — Telephone Encounter (Signed)
-----   Message from [place], [place] sent at 04/17/2021 10:32 AM EST ----- ?Per Dr. Radford Pax ?I am going to change her to auto BiPAP with an IPAP max of 20 cm H2O, EPAP min of 5 cm H2O pressure support of 4 cm H2O ? ?Get a download in 4 weeks and order new CPAP supplies, please. ? ?Thank you! ?Drue Dun ? ?

## 2021-04-30 NOTE — Telephone Encounter (Signed)
Called patient no answer so left a message. ?

## 2021-04-30 NOTE — Telephone Encounter (Signed)
Pt's medication was sent to pt's pharmacy as requested. Confirmation received.  °

## 2021-04-30 NOTE — Telephone Encounter (Signed)
Patient's husband is requesting to speak with Gae Bon, CMA regarding patient needing new CPAP equipment. ?

## 2021-05-01 NOTE — Telephone Encounter (Signed)
Reached out to patient, Mr Lorson understands the patient has to call her DME  [facility] and order her supplies each time she needs them. ?

## 2021-05-02 DIAGNOSIS — I10 Essential (primary) hypertension: Secondary | ICD-10-CM | POA: Diagnosis not present

## 2021-05-02 DIAGNOSIS — I351 Nonrheumatic aortic (valve) insufficiency: Secondary | ICD-10-CM | POA: Diagnosis not present

## 2021-05-02 DIAGNOSIS — I4821 Permanent atrial fibrillation: Secondary | ICD-10-CM | POA: Diagnosis not present

## 2021-05-02 DIAGNOSIS — I5032 Chronic diastolic (congestive) heart failure: Secondary | ICD-10-CM | POA: Diagnosis not present

## 2021-05-06 ENCOUNTER — Telehealth: Payer: Self-pay | Admitting: Cardiology

## 2021-05-06 DIAGNOSIS — I10 Essential (primary) hypertension: Secondary | ICD-10-CM

## 2021-05-06 DIAGNOSIS — I4821 Permanent atrial fibrillation: Secondary | ICD-10-CM

## 2021-05-06 DIAGNOSIS — I5032 Chronic diastolic (congestive) heart failure: Secondary | ICD-10-CM

## 2021-05-06 DIAGNOSIS — I351 Nonrheumatic aortic (valve) insufficiency: Secondary | ICD-10-CM

## 2021-05-06 NOTE — Telephone Encounter (Signed)
The patient's husband has been notified of the result and verbalized understanding.  All questions (if any) were answered. ?Antonieta Iba, RN 05/06/2021 5:11 PM  ?Labs have been scheduled.  ?

## 2021-05-06 NOTE — Telephone Encounter (Signed)
Patient's husband is returning call to discuss monitor results. ?

## 2021-05-06 NOTE — Telephone Encounter (Signed)
-----   Message from Sueanne Margarita, MD sent at 05/05/2021  1:47 PM EST ----- ?Average heart rate is still below 100 bpm but on the higher end of normal.  Please have her come in for TSH level.  BP has been too soft but beta-blocker or CCB ?

## 2021-05-07 NOTE — Telephone Encounter (Signed)
Patient has not yet been scheduled for MRI. Can this please be looked in to? Thank you! ?

## 2021-05-08 ENCOUNTER — Encounter: Payer: Self-pay | Admitting: Oncology

## 2021-05-08 ENCOUNTER — Other Ambulatory Visit: Payer: Self-pay

## 2021-05-08 ENCOUNTER — Other Ambulatory Visit: Payer: Medicare Other

## 2021-05-08 ENCOUNTER — Other Ambulatory Visit: Payer: Self-pay | Admitting: Physician Assistant

## 2021-05-08 DIAGNOSIS — I10 Essential (primary) hypertension: Secondary | ICD-10-CM

## 2021-05-08 DIAGNOSIS — I4821 Permanent atrial fibrillation: Secondary | ICD-10-CM | POA: Diagnosis not present

## 2021-05-08 DIAGNOSIS — I351 Nonrheumatic aortic (valve) insufficiency: Secondary | ICD-10-CM

## 2021-05-08 DIAGNOSIS — M6281 Muscle weakness (generalized): Secondary | ICD-10-CM | POA: Diagnosis not present

## 2021-05-08 DIAGNOSIS — I5032 Chronic diastolic (congestive) heart failure: Secondary | ICD-10-CM

## 2021-05-08 DIAGNOSIS — M2560 Stiffness of unspecified joint, not elsewhere classified: Secondary | ICD-10-CM | POA: Diagnosis not present

## 2021-05-08 DIAGNOSIS — Z1231 Encounter for screening mammogram for malignant neoplasm of breast: Secondary | ICD-10-CM

## 2021-05-08 DIAGNOSIS — M461 Sacroiliitis, not elsewhere classified: Secondary | ICD-10-CM | POA: Diagnosis not present

## 2021-05-08 LAB — TSH: TSH: 1.51 u[IU]/mL (ref 0.450–4.500)

## 2021-05-15 DIAGNOSIS — M5442 Lumbago with sciatica, left side: Secondary | ICD-10-CM | POA: Diagnosis not present

## 2021-05-20 ENCOUNTER — Encounter: Payer: Self-pay | Admitting: Oncology

## 2021-05-20 ENCOUNTER — Ambulatory Visit
Admission: RE | Admit: 2021-05-20 | Discharge: 2021-05-20 | Disposition: A | Discharge status: Home / Self Care | Payer: Medicare Other | Source: Ambulatory Visit | Attending: Adult Health | Admitting: Adult Health

## 2021-05-20 ENCOUNTER — Other Ambulatory Visit: Payer: Self-pay

## 2021-05-20 DIAGNOSIS — R4189 Other symptoms and signs involving cognitive functions and awareness: Secondary | ICD-10-CM | POA: Diagnosis not present

## 2021-05-20 DIAGNOSIS — R413 Other amnesia: Secondary | ICD-10-CM | POA: Diagnosis not present

## 2021-05-24 ENCOUNTER — Encounter: Payer: Self-pay | Admitting: Adult Health

## 2021-05-26 DIAGNOSIS — M545 Low back pain, unspecified: Secondary | ICD-10-CM | POA: Diagnosis not present

## 2021-05-26 NOTE — Telephone Encounter (Signed)
Can MRI results please be faxed to Dr. Eliezer Lofts [place] at [facility] for review. Thank you!  ?

## 2021-05-26 NOTE — Telephone Encounter (Signed)
MRI hasn't been resulted yet, please advise  ?

## 2021-05-27 ENCOUNTER — Ambulatory Visit: Payer: TRICARE For Life (TFL) | Admitting: Cardiology

## 2021-05-28 DIAGNOSIS — Z20822 Contact with and (suspected) exposure to covid-19: Secondary | ICD-10-CM | POA: Diagnosis not present

## 2021-05-29 ENCOUNTER — Encounter: Payer: Self-pay | Admitting: *Deleted

## 2021-05-29 NOTE — Progress Notes (Signed)
Received referral from Dr Rhona Raider. Patient is a previous patient of Dr [name] for history of breast cancer. She presented with hip/back pain and MRI revealed possible metastatic process.  ? ?Reached out to Patricia [name] to introduce myself as the office RN Navigator and explain our new patient process. Spoke to her husband, Patricia [name]. Reviewed the reason for their referral and scheduled their new patient appointment along with labs. Provided address and directions to the office including call back phone number. Reviewed with patient any concerns they may have or any possible barriers to attending their appointment.  ? ?Informed patient about my role as a navigator and that I will meet with them prior to their New Patient appointment and more fully discuss what services I can provide. At this time patient has no further questions or needs.   ? ?Oncology Nurse Navigator Documentation ? ? ?  05/29/2021  ?  8:30 AM  ?Oncology Nurse Navigator Flowsheets  ?Abnormal Finding Date 05/26/2021  ?Diagnosis Status Additional Work Up  ?Navigator Follow Up Date: 05/30/2021  ?Navigator Follow Up Reason: New Patient Appointment  ?Navigator Location [facility]  ?Navigator Encounter Type Introductory Phone Call  ?Patient Visit Type MedOnc  ?Treatment Phase Abnormal Scans  ?Barriers/Navigation Needs Coordination of Care;Employed  ?Interventions Coordination of Care;Education  ?Acuity Level 2-Minimal Needs (1-2 Barriers Identified)  ?Coordination of Care Appts  ?Education Method Verbal  ?Support Groups/Services Friends and Family  ?Time Spent with Patient 30  ?  ?

## 2021-05-30 ENCOUNTER — Encounter: Payer: Self-pay | Admitting: Hematology & Oncology

## 2021-05-30 ENCOUNTER — Encounter: Payer: Self-pay | Admitting: *Deleted

## 2021-05-30 ENCOUNTER — Other Ambulatory Visit: Payer: Self-pay

## 2021-05-30 ENCOUNTER — Inpatient Hospital Stay: Payer: Medicare Other | Attending: Hematology & Oncology

## 2021-05-30 ENCOUNTER — Inpatient Hospital Stay (HOSPITAL_BASED_OUTPATIENT_CLINIC_OR_DEPARTMENT_OTHER): Payer: Medicare Other | Admitting: Hematology & Oncology

## 2021-05-30 VITALS — BP 107/59 | HR 86 | Temp 97.5°F | Resp 20 | Ht 60.0 in | Wt 150.0 lb

## 2021-05-30 DIAGNOSIS — Z79899 Other long term (current) drug therapy: Secondary | ICD-10-CM | POA: Insufficient documentation

## 2021-05-30 DIAGNOSIS — M899 Disorder of bone, unspecified: Secondary | ICD-10-CM | POA: Diagnosis not present

## 2021-05-30 DIAGNOSIS — N183 Chronic kidney disease, stage 3 unspecified: Secondary | ICD-10-CM | POA: Insufficient documentation

## 2021-05-30 DIAGNOSIS — Z8673 Personal history of transient ischemic attack (TIA), and cerebral infarction without residual deficits: Secondary | ICD-10-CM | POA: Diagnosis not present

## 2021-05-30 DIAGNOSIS — Z853 Personal history of malignant neoplasm of breast: Secondary | ICD-10-CM | POA: Diagnosis not present

## 2021-05-30 DIAGNOSIS — Z170 Estrogen receptor positive status [ER+]: Secondary | ICD-10-CM

## 2021-05-30 DIAGNOSIS — C50412 Malignant neoplasm of upper-outer quadrant of left female breast: Secondary | ICD-10-CM | POA: Diagnosis not present

## 2021-05-30 DIAGNOSIS — Z85118 Personal history of other malignant neoplasm of bronchus and lung: Secondary | ICD-10-CM | POA: Insufficient documentation

## 2021-05-30 DIAGNOSIS — M8580 Other specified disorders of bone density and structure, unspecified site: Secondary | ICD-10-CM | POA: Insufficient documentation

## 2021-05-30 LAB — CBC WITH DIFFERENTIAL (CANCER CENTER ONLY)
Abs Immature Granulocytes: 0.04 10*3/uL (ref 0.00–0.07)
Basophils Absolute: 0 10*3/uL (ref 0.0–0.1)
Basophils Relative: 0 %
Eosinophils Absolute: 0 10*3/uL (ref 0.0–0.5)
Eosinophils Relative: 0 %
HCT: 45.4 % (ref 36.0–46.0)
Hemoglobin: 15.2 g/dL — ABNORMAL HIGH (ref 12.0–15.0)
Immature Granulocytes: 1 %
Lymphocytes Relative: 20 %
Lymphs Abs: 1.4 10*3/uL (ref 0.7–4.0)
MCH: 32.3 pg (ref 26.0–34.0)
MCHC: 33.5 g/dL (ref 30.0–36.0)
MCV: 96.4 fL (ref 80.0–100.0)
Monocytes Absolute: 0.6 10*3/uL (ref 0.1–1.0)
Monocytes Relative: 9 %
Neutro Abs: 4.7 10*3/uL (ref 1.7–7.7)
Neutrophils Relative %: 70 %
Platelet Count: 244 10*3/uL (ref 150–400)
RBC: 4.71 MIL/uL (ref 3.87–5.11)
RDW: 13.9 % (ref 11.5–15.5)
WBC Count: 6.8 10*3/uL (ref 4.0–10.5)
nRBC: 0 % (ref 0.0–0.2)

## 2021-05-30 LAB — CMP (CANCER CENTER ONLY)
ALT: 22 U/L (ref 0–44)
AST: 24 U/L (ref 15–41)
Albumin: 4.1 g/dL (ref 3.5–5.0)
Alkaline Phosphatase: 120 U/L (ref 38–126)
Anion gap: 7 (ref 5–15)
BUN: 22 mg/dL (ref 8–23)
CO2: 30 mmol/L (ref 22–32)
Calcium: 9.7 mg/dL (ref 8.9–10.3)
Chloride: 104 mmol/L (ref 98–111)
Creatinine: 1.2 mg/dL — ABNORMAL HIGH (ref 0.44–1.00)
GFR, Estimated: 46 mL/min — ABNORMAL LOW (ref >60)
Glucose, Bld: 83 mg/dL (ref 70–99)
Potassium: 4.1 mmol/L (ref 3.5–5.1)
Sodium: 141 mmol/L (ref 135–145)
Total Bilirubin: 0.6 mg/dL (ref 0.3–1.2)
Total Protein: 7 g/dL (ref 6.5–8.1)

## 2021-05-30 LAB — LACTATE DEHYDROGENASE: LDH: 250 U/L — ABNORMAL HIGH (ref 98–192)

## 2021-05-30 LAB — SAVE SMEAR(SSMR), FOR PROVIDER SLIDE REVIEW

## 2021-05-30 NOTE — Progress Notes (Signed)
Initial RN Navigator Patient Visit ? ?Name: Patricia [name] ?Date of Referral : 05/28/2021 ?Diagnosis: Bone Lesions ? ?Met with patient prior to their visit with MD. Hanley Seamen patient "Your Patient Navigator" handout which explains my role, areas in which I am able to help, and all the contact information for myself and the office. Also gave patient MD and Navigator business card. Reviewed with patient the general overview of expected course after initial diagnosis and time frame for all steps to be completed. ? ?New patient packet given to patient which includes: orientation to office and staff; campus directory; education on My Chart and Advance Directives; and patient centered education on cancer.  ? ?Patient comes with her husband. In addition to her breast cancer history, she also reports a lung cancer history. 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 Path report given to Dr Marin Olp.  ? ?Patient is interested in genetics. Will confirm that she qualifies for testing.  ? ?Patient completed visit with Dr. Marin Olp ? ?Patient will need a PET scan. She will also need a vertebroplasty with possible biopsy. Dr Marin Olp will reach out to IR.  ? ?PET scheduled for 06/13/21. Patient is aware of appointment date, time, location and prep of water only after 9:30a and arrival time of 3p. Radiology info sheet also mailed to patient home for reinforcement of education.  ? ?Patient understands all follow up procedures and expectations.  ? ?Oncology Nurse Navigator Documentation ? ? ?  05/30/2021  ? 11:00 AM  ?Oncology Nurse Navigator Flowsheets  ?Navigator Follow Up Date: 06/03/2021  ?Navigator Follow Up Reason: Appointment Review  ?Navigator Location [facility]  ?Navigator Encounter Type Initial MedOnc  ?Patient Visit Type MedOnc  ?Treatment Phase Abnormal Scans  ?Barriers/Navigation Needs Coordination of Care;Employed  ?Interventions Coordination of Care;Education;Psycho-Social Support  ?Acuity Level 2-Minimal  Needs (1-2 Barriers Identified)  ?Coordination of Care Radiology  ?Education Method Verbal;Written  ?Support Groups/Services Friends and Family  ?Time Spent with Patient 45  ?  ?

## 2021-05-30 NOTE — Progress Notes (Signed)
Referral MD ? 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 ?Chief Complaint  ?Patient presents with  ? New Patient (Initial Visit)  ?: I am having back pain. ? ?HPI: [name] is incredibly charming.  She is a very nice 81 year old white female.  She comes in with her husband.  They have been married 40 years.  He was in the [employer] for 20 years.  As such, he is a true American hero.  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 ? ?She has been followed by the iconic Dr. [name].  She was diagnosed with stage IIa lobular carcinoma of the left breast.  This was back in April 2015.  The tumor was estrogen positive.  It was progesterone negative.  There is none of material for HER2 studies. ? ?She underwent a lumpectomy followed by radiation therapy.  She was tried on anastrozole.  She also had been on Prolia. ? ?In September 2020, she had a possible TIA.  An MRI did not show any acute infarct. ? ?She was COVID negative. ? ?Of note, she was given neoadjuvant anastrozole back in April 2015.  This was stopped in June 2015 with poor tolerance.  She was then placed on tamoxifen in July 2015.  This was discontinued in April 2017 after having a stroke.  She was then resumed on anastrozole.  This was subsequently discontinued in February 2021. ? ?Of note, she underwent a lumpectomy in April 2016.  This actually showed a stage Ia (T1cN0M0) invasive lobular carcinoma.  She has a positive margins.  Additional surgery was done.  She was HER2 negative. ? 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  This was a small carcinoid the measuring 1.7 cm.  She had 6 lymph nodes sampled which were all negative. ? ?She was DC'd from the [facility] in 2021.  She has been doing well until recently.  She began to have some back discomfort.  She and her husband both see the wonderful Dr. Rhona Raider.  He was insightful enough to order an MRI of the  lumbar spine.  This was done on 05/26/2021.  Unfortunately, this showed that there was malignant appearing marrow replacement throughout L5.  There was some extraosseous extension of tumor.  She had left L5 neuroforamen involvement.  She had mild to moderate right L5 neural foraminal stenosis.  There was a small lesion noted in the right sacral ala. ? ?Based on this she was, referred to the [facility] for an evaluation.  She actually looks quite good.  She only is taking Tylenol. ? ?She has had no change in bowel or bladder habits.  She has had no weight loss or weight gain.  She has had no cough or shortness of breath.  She has had no headache.  There is been no leg swelling.  There is no leg weakness. ? ?Overall, I would say her performance status is probably ECOG 1. ? ? ? ?Past Medical History:  ?Diagnosis Date  ? Allergic rhinitis, cause unspecified   ? Aortic regurgitation 06/17/2015  ? mild by echo 2021  ? Arthritis   ? in my fingers  ? Cancer [facility])   ? 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 Chronic atrial fibrillation (HCC)   ? a. Dx 04/2015 at time of stroke. b. DCCV 06/2015 - did not hold. Amio started then  stopped due to QT prolongation.  ? Chronic cough   ? Chronic diastolic heart failure ([place]) 06/17/2015  ? CKD (chronic kidney disease), stage III ([place])   ? Cough variant asthma   ? Disease of pharynx or nasopharynx   ? Diverticulosis   ? Enlargement of right atrium 01/23/2020  ? Essential hypertension   ? GERD (gastroesophageal reflux disease)   ? Glaucoma   ? Hiatal hernia   ? Hyperlipidemia   ? Internal hemorrhoids   ? Laryngospasm   ? Mitral regurgitation   ? mild to moderate by echo 12/2019 and moderate by TEE 12/2020  ? Multinodular goiter   ? Osteopenia   ? Postmenopausal   ? Pulmonary HTN ([place])   ?  Moderate with PASP 69mHg by echo 2018 likely Group 2 from pulmonary venous HTN from CHF>>normal PAP by echo 12/2019  ? Radiation 10/22/14-11/23/14  ? Left Breast  ? Stricture and  stenosis of cervix   ?: ? ? ?Past Surgical History:  ?Procedure Laterality Date  ? BREAST LUMPECTOMY Left   ? BREAST SURGERY    ? CARDIOVERSION N/A 06/24/2015  ? Procedure: CARDIOVERSION;  Surgeon: TSueanne Margarita MD;  [facility]  Service: Cardiovascular;  Laterality: N/A;  ? CARDIOVERSION N/A 05/19/2016  ? Procedure: CARDIOVERSION;  Surgeon: TSueanne Margarita MD;  [facility]  Service: Cardiovascular;  Laterality: N/A;  ? COLONOSCOPY    ? LUNG CANCER SURGERY  2003  ? resection carcinoid lingula-lt upper lobe  ? RADIOACTIVE SEED GUIDED PARTIAL MASTECTOMY WITH AXILLARY SENTINEL LYMPH NODE BIOPSY Left 06/26/2014  ? Procedure: RADIOACTIVE SEED GUIDED PARTIAL MASTECTOMY WITH AXILLARY SENTINEL LYMPH NODE BIOPSY;  Surgeon: FStark Klein MD;  [facility]  Service: General;  Laterality: Left;  ? RE-EXCISION OF BREAST LUMPECTOMY Left 08/07/2014  ? Procedure: RE-EXCISION OF LEFT BREAST LUMPECTOMY;  Surgeon: FStark Klein MD;  [facility]  Service: General;  Laterality: Left;  ? RIGHT HEART CATH N/A 04/27/2016  ? Procedure: Right Heart Cath;  Surgeon: DLarey Dresser MD;  [facility];  Service: Cardiovascular;  Laterality: N/A;  ? TEE WITHOUT CARDIOVERSION N/A 01/07/2021  ? Procedure: TRANSESOPHAGEAL ECHOCARDIOGRAM (TEE);  Surgeon: NAcie FredricksonPWonda Cheng MD;  [facility];  Service: Cardiovascular;  Laterality: N/A;  ?: ? ? ?Current Outpatient Medications:  ?  ADVAIR HFA 2983-38MCG/ACT inhaler, USE 2 INHALATIONS TWICE A DAY, Disp: 36 g, Rfl: 3 ?  albuterol (PROVENTIL HFA;VENTOLIN HFA) 108 (90 Base) MCG/ACT inhaler, Inhale 2 puffs into the lungs every 6 (six) hours as needed for wheezing or shortness of breath., Disp: , Rfl:  ?  apixaban (ELIQUIS) 5 MG TABS tablet, TAKE 1 TABLET TWICE A DAY, Disp: 180 tablet, Rfl: 1 ?  Apoaequorin (PREVAGEN) 10 MG CAPS, Take 1 capsule by mouth every morning., Disp: 90 capsule, Rfl: 3 ?  ARMOUR THYROID 60 MG  tablet, TAKE 1 TABLET DAILY BEFORE BREAKFAST, Disp: 90 tablet, Rfl: 1 ?  atorvastatin (LIPITOR) 40 MG tablet, TAKE 1 TABLET DAILY (Patient taking differently: Take 40 mg by mouth daily before supper.), Disp: 90 tablet, Rfl: 3 ?  bimatoprost (LUMIGAN) 0.01 % SOLN, Place 1 drop into both eyes 2 (two) times daily., Disp: , Rfl:  ?  calcium-vitamin D (OSCAL WITH D) 500-200 MG-UNIT tablet, Take 1 tablet by mouth daily with breakfast., Disp: 30 tablet, Rfl: 0 ?  diltiazem (CARDIZEM CD) 300 MG 24 hr capsule, TAKE 1 CAPSULE DAILY, Disp: 90 capsule, Rfl:  2 ?  dorzolamide-timolol (COSOPT) 22.3-6.8 MG/ML ophthalmic solution, Place 1 drop into both eyes at bedtime., Disp: , Rfl:  ?  fluticasone (FLONASE) 50 MCG/ACT nasal spray, 1 spray each nostril following sinus rinses twice daily, Disp: 16 g, Rfl: 2 ?  furosemide (LASIX) 40 MG tablet, Take 1 tablet (40 mg total) by mouth 2 (two) times daily., Disp: 180 tablet, Rfl: 3 ?  gabapentin (NEURONTIN) 400 MG capsule, TAKE 1 CAPSULE TWICE A DAY, Disp: 180 capsule, Rfl: 3 ?  L-Methylfolate-B12-B6-B2 (CEREFOLIN) 07-31-48-5 MG TABS, TAKE 1 TABLET BY MOUTH EVERY MORNING, Disp: 90 tablet, Rfl: 3 ?  levETIRAcetam (KEPPRA XR) 500 MG 24 hr tablet, TAKE 1 TABLET DAILY (Patient taking differently: Take 500 mg by mouth every evening.), Disp: 90 tablet, Rfl: 3 ?  loratadine (CLARITIN) 10 MG tablet, Take 10 mg by mouth daily., Disp: , Rfl:  ?  memantine (NAMENDA) 10 MG tablet, Take 1 tablet (10 mg total) by mouth 2 (two) times daily., Disp: 60 tablet, Rfl: 5 ?  metoprolol succinate (TOPROL-XL) 50 MG 24 hr tablet, Take 1.5 tablets (75 mg total) by mouth daily. Take with or immediately following a meal. (Patient taking differently: Take 75 mg by mouth every evening. Take with or immediately following a meal.), Disp: 135 tablet, Rfl: 3 ?  Multiple Vitamins-Iron (MULTIVITAMIN/IRON) TABS, Take 1 tablet by mouth daily., Disp: , Rfl:  ?  Netarsudil Dimesylate (RHOPRESSA) 0.02 % SOLN, Place 1 drop into both  eyes at bedtime., Disp: , Rfl:  ?  pantoprazole (PROTONIX) 40 MG tablet, TAKE 1 TABLET DAILY, Disp: 90 tablet, Rfl: 3 ?  potassium chloride SA (KLOR-CON) 20 MEQ tablet, TAKE 1 TABLET TWICE A DAY (KEEP U

## 2021-05-31 LAB — IGG, IGA, IGM
IgA: 184 mg/dL (ref 64–422)
IgG (Immunoglobin G), Serum: 1122 mg/dL (ref 586–1602)
IgM (Immunoglobulin M), Srm: 235 mg/dL — ABNORMAL HIGH (ref 26–217)

## 2021-05-31 LAB — CANCER ANTIGEN 27.29: CA 27.29: 27.6 U/mL (ref 0.0–38.6)

## 2021-06-02 LAB — PROTEIN ELECTROPHORESIS, SERUM, WITH REFLEX
A/G Ratio: 1.1 (ref 0.7–1.7)
Albumin ELP: 3.5 g/dL (ref 2.9–4.4)
Alpha-1-Globulin: 0.2 g/dL (ref 0.0–0.4)
Alpha-2-Globulin: 0.8 g/dL (ref 0.4–1.0)
Beta Globulin: 0.9 g/dL (ref 0.7–1.3)
Gamma Globulin: 1.2 g/dL (ref 0.4–1.8)
Globulin, Total: 3.1 g/dL (ref 2.2–3.9)
Total Protein ELP: 6.6 g/dL (ref 6.0–8.5)

## 2021-06-02 LAB — KAPPA/LAMBDA LIGHT CHAINS
Kappa free light chain: 25.6 mg/L — ABNORMAL HIGH (ref 3.3–19.4)
Kappa, lambda light chain ratio: 1.64 (ref 0.26–1.65)
Lambda free light chains: 15.6 mg/L (ref 5.7–26.3)

## 2021-06-02 LAB — CEA (IN HOUSE-CHCC): CEA (CHCC-In House): 15.15 ng/mL — ABNORMAL HIGH (ref 0.00–5.00)

## 2021-06-05 ENCOUNTER — Encounter: Payer: Self-pay | Admitting: *Deleted

## 2021-06-05 NOTE — Progress Notes (Signed)
Patient has not yet been scheduled for IR evaluation for vertebroplasty. Spoke to Dr Marin Olp. He would like to wait for PET scan results prior to planning vertebroplasty and bone biopsy. PET scheduled for 06/13/21. ? ?Oncology Nurse Navigator Documentation ? ? ?  06/05/2021  ? 11:00 AM  ?Oncology Nurse Navigator Flowsheets  ?Navigator Follow Up Date: 06/13/2021  ?Navigator Follow Up Reason: Scan Review  ?Navigator Location [facility]  ?Navigator Encounter Type Appt/Treatment Plan Review  ?Patient Visit Type MedOnc  ?Treatment Phase Abnormal Scans  ?Barriers/Navigation Needs Coordination of Care;Employed  ?Interventions Coordination of Care  ?Acuity Level 2-Minimal Needs (1-2 Barriers Identified)  ?Coordination of Care Other  ?Support Groups/Services Friends and Family  ?Time Spent with Patient 15  ?  ?

## 2021-06-09 ENCOUNTER — Telehealth: Payer: Self-pay | Admitting: *Deleted

## 2021-06-09 ENCOUNTER — Other Ambulatory Visit: Payer: Self-pay | Admitting: Hematology & Oncology

## 2021-06-09 MED ORDER — TRAMADOL HCL 50 MG PO TABS: 50.0000 mg | ORAL_TABLET | Freq: Four times a day (QID) | ORAL | 0 refills | Status: DC | PRN

## 2021-06-09 NOTE — Progress Notes (Signed)
Tramadol

## 2021-06-09 NOTE — Telephone Encounter (Signed)
Received a call from patient stating that she has been having pain in her left hip and upper leg when she walks.  Requesting something to take for management of pain.  Dr Marin Olp notified.  Sent Rx to patient pharmacy.  Patient notified.   ?

## 2021-06-13 ENCOUNTER — Encounter (HOSPITAL_COMMUNITY)
Admission: RE | Admit: 2021-06-13 | Discharge: 2021-06-13 | Disposition: A | Discharge status: Home / Self Care | Payer: Medicare Other | Source: Ambulatory Visit | Attending: Hematology & Oncology | Admitting: Hematology & Oncology

## 2021-06-13 DIAGNOSIS — C50412 Malignant neoplasm of upper-outer quadrant of left female breast: Secondary | ICD-10-CM | POA: Insufficient documentation

## 2021-06-13 DIAGNOSIS — J984 Other disorders of lung: Secondary | ICD-10-CM | POA: Diagnosis not present

## 2021-06-13 DIAGNOSIS — Z170 Estrogen receptor positive status [ER+]: Secondary | ICD-10-CM | POA: Insufficient documentation

## 2021-06-13 DIAGNOSIS — J90 Pleural effusion, not elsewhere classified: Secondary | ICD-10-CM | POA: Diagnosis not present

## 2021-06-13 DIAGNOSIS — E278 Other specified disorders of adrenal gland: Secondary | ICD-10-CM | POA: Diagnosis not present

## 2021-06-13 DIAGNOSIS — C50919 Malignant neoplasm of unspecified site of unspecified female breast: Secondary | ICD-10-CM | POA: Diagnosis not present

## 2021-06-13 LAB — GLUCOSE, CAPILLARY: Glucose-Capillary: 123 mg/dL — ABNORMAL HIGH (ref 70–99)

## 2021-06-13 MED ORDER — FLUDEOXYGLUCOSE F - 18 (FDG) INJECTION
8.0000 | Freq: Once | INTRAVENOUS | Status: AC
  Administered 2021-06-13: 8 via INTRAVENOUS

## 2021-06-14 ENCOUNTER — Other Ambulatory Visit: Payer: Self-pay | Admitting: Physician Assistant

## 2021-06-14 DIAGNOSIS — E032 Hypothyroidism due to medicaments and other exogenous substances: Secondary | ICD-10-CM

## 2021-06-17 ENCOUNTER — Other Ambulatory Visit: Payer: Self-pay | Admitting: Hematology & Oncology

## 2021-06-17 ENCOUNTER — Encounter: Payer: Self-pay | Admitting: *Deleted

## 2021-06-17 DIAGNOSIS — Z170 Estrogen receptor positive status [ER+]: Secondary | ICD-10-CM

## 2021-06-17 DIAGNOSIS — C7951 Secondary malignant neoplasm of bone: Secondary | ICD-10-CM

## 2021-06-17 DIAGNOSIS — M8458XA Pathological fracture in neoplastic disease, other specified site, initial encounter for fracture: Secondary | ICD-10-CM

## 2021-06-17 NOTE — Progress Notes (Signed)
Reviewed PET with Dr Marin Olp. He would like IR to perform a kyphoplasty with biopsy. He will place orders.  ? ?Called and spoke to patient's husband, [name]. Reviewed results with him. Explained that we will need to proceed with a kyphoplasty through IR with a biopsy. He understood. Explained to him that IR would reach out to schedule. Instructed him to call my Thursday if he hadn't been called to schedule. He agreed. ? ?Oncology Nurse Navigator Documentation ? ? ?  06/17/2021  ?  9:00 AM  ?Oncology Nurse Navigator Flowsheets  ?Navigator Follow Up Date: 06/19/2021  ?Navigator Follow Up Reason: Appointment Review  ?Navigator Location [facility]  ?Navigator Encounter Type Scan Review;Telephone  ?Telephone Diagnostic Results;Education  ?Patient Visit Type MedOnc  ?Treatment Phase Abnormal Scans  ?Barriers/Navigation Needs Coordination of Care;Employed  ?Interventions Education;Psycho-Social Support  ?Acuity Level 2-Minimal Needs (1-2 Barriers Identified)  ?Coordination of Care Other  ?Education Method Verbal  ?Support Groups/Services Friends and Family  ?Time Spent with Patient 30  ?  ?

## 2021-06-20 ENCOUNTER — Other Ambulatory Visit: Payer: Self-pay | Admitting: Hematology & Oncology

## 2021-06-20 ENCOUNTER — Other Ambulatory Visit: Payer: Self-pay | Admitting: Cardiology

## 2021-06-20 ENCOUNTER — Other Ambulatory Visit: Payer: Self-pay | Admitting: Family Medicine

## 2021-06-20 ENCOUNTER — Telehealth: Payer: Self-pay | Admitting: *Deleted

## 2021-06-20 ENCOUNTER — Encounter: Payer: Self-pay | Admitting: *Deleted

## 2021-06-20 DIAGNOSIS — M8458XA Pathological fracture in neoplastic disease, other specified site, initial encounter for fracture: Secondary | ICD-10-CM

## 2021-06-20 DIAGNOSIS — C7951 Secondary malignant neoplasm of bone: Secondary | ICD-10-CM

## 2021-06-20 DIAGNOSIS — C50412 Malignant neoplasm of upper-outer quadrant of left female breast: Secondary | ICD-10-CM

## 2021-06-20 NOTE — Telephone Encounter (Signed)
? ?  Pre-operative Risk Assessment  ?  ?Patient Name: Patricia [name]  ?DOB: May 17, 1940 ?MRN: [id number]  ? ?  ? ?Request for Surgical Clearance   ? ?Procedure:   KYPHOPLASTY ? ?Date of Surgery:  Clearance TBD                              ?   ?Surgeon:  MD NOT LISTED  ?Surgeon's Group or Practice Name:  [facility]  ?Phone number:  [phone] ?Fax number:  [phone] ATTN: CATHY C ?  ?Type of Clearance Requested:   ?- Medical  ?- Pharmacy:  Hold Apixaban (Eliquis) x 2 DAYS PRIOR ?  ?Type of Anesthesia:  Local  ?  ?Additional requests/questions:   ? ?Signed, ?Julaine Hua   ?06/20/2021, 1:53 PM  ? ?

## 2021-06-20 NOTE — Progress Notes (Signed)
Patient has not yet been scheduled for kyphoplasty with biopsy, however the clinic is in the process of contacting patient and obtaining needed clearances.  ? ?Oncology Nurse Navigator Documentation ? ? ?  06/20/2021  ?  3:00 PM  ?Oncology Nurse Navigator Flowsheets  ?Navigator Location [facility]  ?Navigator Encounter Type Appt/Treatment Plan Review  ?Patient Visit Type MedOnc  ?Treatment Phase Abnormal Scans  ?Barriers/Navigation Needs Coordination of Care;Employed  ?Interventions None Required  ?Acuity Level 2-Minimal Needs (1-2 Barriers Identified)  ?Support Groups/Services Friends and Family  ?Time Spent with Patient 15  ?  ?

## 2021-06-23 ENCOUNTER — Telehealth: Payer: Self-pay | Admitting: *Deleted

## 2021-06-23 ENCOUNTER — Encounter: Payer: Self-pay | Admitting: *Deleted

## 2021-06-23 DIAGNOSIS — Z20822 Contact with and (suspected) exposure to covid-19: Secondary | ICD-10-CM | POA: Diagnosis not present

## 2021-06-23 NOTE — Telephone Encounter (Signed)
Pt agreeable to plan of care for tele pre op appt 06/24/21 @ 9 am. Med rec and consent are done.  ?

## 2021-06-23 NOTE — Progress Notes (Signed)
Patient scheduled for evaluation for kyphoplasty on 06/25/21.  ? ?Oncology Nurse Navigator Documentation ? ? ?  06/23/2021  ?  8:00 AM  ?Oncology Nurse Navigator Flowsheets  ?Navigator Follow Up Date: 06/25/2021  ?Navigator Follow Up Reason: Review Note  ?Navigator Location [facility]  ?Navigator Encounter Type Appt/Treatment Plan Review  ?Patient Visit Type MedOnc  ?Treatment Phase Abnormal Scans  ?Barriers/Navigation Needs Coordination of Care;Employed  ?Interventions None Required  ?Acuity Level 2-Minimal Needs (1-2 Barriers Identified)  ?Support Groups/Services Friends and Family  ?Time Spent with Patient 15  ?  ?

## 2021-06-23 NOTE — Telephone Encounter (Signed)
Pt agreeable to plan of care for tele pre op appt 06/24/21 @ 9 am. Med rec and consent are done.  ? ? ?  ?Patient Consent for Virtual Visit  ? ? ?   ? ?Patricia [name] has provided verbal consent on 06/23/2021 for a virtual visit (video or telephone). ? ? ?CONSENT FOR VIRTUAL VISIT FOR:  Patricia [name]  ?By participating in this virtual visit I agree to the following: ? ?I hereby voluntarily request, consent and authorize [facility] and its employed or contracted physicians, physician assistants, nurse practitioners or other licensed health care professionals (the Practitioner), to provide me with telemedicine health care services (the ?Services") as deemed necessary by the treating Practitioner. I acknowledge and consent to receive the Services by the Practitioner via telemedicine. I understand that the telemedicine visit will involve communicating with the Practitioner through live audiovisual communication technology and the disclosure of certain medical information by electronic transmission. I acknowledge that I have been given the opportunity to request an in-person assessment or other available alternative prior to the telemedicine visit and am voluntarily participating in the telemedicine visit. ? ?I understand that I have the right to withhold or withdraw my consent to the use of telemedicine in the course of my care at any time, without affecting my right to future care or treatment, and that the Practitioner or I may terminate the telemedicine visit at any time. I understand that I have the right to inspect all information obtained and/or recorded in the course of the telemedicine visit and may receive copies of available information for a reasonable fee.  I understand that some of the potential risks of receiving the Services via telemedicine include:  ?Delay or interruption in medical evaluation due to technological equipment failure or disruption; ?Information transmitted may not be sufficient (e.g. poor  resolution of images) to allow for appropriate medical decision making by the Practitioner; and/or  ?In rare instances, security protocols could fail, causing a breach of personal health information. ? ?Furthermore, I acknowledge that it is my responsibility to provide information about my medical history, conditions and care that is complete and accurate to the best of my ability. I acknowledge that Practitioner's advice, recommendations, and/or decision may be based on factors not within their control, such as incomplete or inaccurate data provided by me or distortions of diagnostic images or specimens that may result from electronic transmissions. I understand that the practice of medicine is not an exact science and that Practitioner makes no warranties or guarantees regarding treatment outcomes. I acknowledge that a copy of this consent can be made available to me via my patient portal ([facility]), or I can request a printed copy by calling the office of [facility].   ? ?I understand that my insurance will be billed for this visit.  ? ?I have read or had this consent read to me. ?I understand the contents of this consent, which adequately explains the benefits and risks of the Services being provided via telemedicine.  ?I have been provided ample opportunity to ask questions regarding this consent and the Services and have had my questions answered to my satisfaction. ?I give my informed consent for the services to be provided through the use of telemedicine in my medical care ? ? ? ?

## 2021-06-23 NOTE — Telephone Encounter (Signed)
Patient with diagnosis of afib on Eliquis for anticoagulation.   ? ?Procedure: KYPHOPLASTY ?Date of procedure: TBD ? ?CHA2DS2-VASc Score = 7  ? This indicates a 11.2% annual risk of stroke. ?The patient's score is based upon: ?CHF History: 1 ?HTN History: 1 ?Diabetes History: 0 ?Stroke History: 2 ?Vascular Disease History: 0 ?Age Score: 2 ?Gender Score: 1 ?  ?  ? ?CrCl 32 ml/min ?Platelet count 244 ? ?With patient hx of CVA and the need to hold anticoagulation 3 days since this is a spinal procedure, I will defer to Dr. Radford Pax. ?

## 2021-06-23 NOTE — Telephone Encounter (Signed)
? ? ?  Name: Patricia [name]  ?DOB: 04/02/1940  ?MRN: [id number] ? ?Primary Cardiologist: Fransico Him, MD ? ? ?Preoperative team, please contact this patient and set up a phone call appointment for further preoperative risk assessment. Please obtain consent and complete medication review. Thank you for your help. ? ?I confirm that guidance regarding antiplatelet and oral anticoagulation therapy has been completed and, if necessary, noted below. ? ?Per Dr. Radford Pax -  ?She has permanent atrial fibrillation so is at increased risk of a repeat cardioembolic event if holding Eliquis for 3 days and patient needs to understand that to consent to going through procedure  ? ? ? ?Ledora Bottcher, PA ?06/23/2021, 8:42 AM ?[facility] ?[address] ?[place], [place] [address] ? ? ?

## 2021-06-24 ENCOUNTER — Ambulatory Visit (INDEPENDENT_AMBULATORY_CARE_PROVIDER_SITE_OTHER): Payer: Medicare Other | Admitting: Physician Assistant

## 2021-06-24 DIAGNOSIS — I4821 Permanent atrial fibrillation: Secondary | ICD-10-CM | POA: Diagnosis not present

## 2021-06-24 DIAGNOSIS — I5032 Chronic diastolic (congestive) heart failure: Secondary | ICD-10-CM

## 2021-06-24 DIAGNOSIS — Z01810 Encounter for preprocedural cardiovascular examination: Secondary | ICD-10-CM | POA: Diagnosis not present

## 2021-06-24 NOTE — Progress Notes (Signed)
? ?Virtual Visit via Telephone Note  ? ?This visit type was conducted due to national recommendations for restrictions regarding the COVID-19 Pandemic (e.g. social distancing) in an effort to limit this patient's exposure and mitigate transmission in our community.  Due to her co-morbid illnesses, this patient is at least at moderate risk for complications without adequate follow up.  This format is felt to be most appropriate for this patient at this time.  The patient did not have access to video technology/had technical difficulties with video requiring transitioning to audio format only (telephone).  All issues noted in this document were discussed and addressed.  No physical exam could be performed with this format.  Please refer to the patient's chart for her  consent to telehealth for [facility]. ? ?Evaluation Performed:  Preoperative cardiovascular risk assessment ?_____________  ? ?Date:  06/24/2021  ? ?Patient ID:  Patricia [name], DOB 1941/02/13, MRN [id number] ?Patient Location:  ?Home ?Provider location:   ?Office ? ?Primary Care Provider:  Lorrene Reid, PA-C ?Primary Cardiologist:  Fransico Him, MD ? ?Chief Complaint  ?  ?81 y.o. y/o female with a h/o  of chronic AFib on Eliquis (amiodarone stopped because of prolonged QT), CVA, chronic diastolic CHF, mild  AI, normal NST 2017, lung CA resection 2003, breast CA s/p lumpectomy/Tamoxifen/XRT, HTN, HLD and OSA, who is pending kyphoplasty with biopsy, and presents today for telephonic preoperative cardiovascular risk assessment. ? ?Past Medical History  ?  ?Past Medical History:  ?Diagnosis Date  ? Allergic rhinitis, cause unspecified   ? Aortic regurgitation 06/17/2015  ? mild by echo 2021  ? Arthritis   ? in my fingers  ? Cancer [facility])   ? 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 Chronic atrial fibrillation (HCC)   ? a. Dx 04/2015 at time of stroke. b. DCCV 06/2015 - did not hold. Amio started then stopped due to QT prolongation.  ? Chronic cough   ?  Chronic diastolic heart failure ([place]) 06/17/2015  ? CKD (chronic kidney disease), stage III ([place])   ? Cough variant asthma   ? Disease of pharynx or nasopharynx   ? Diverticulosis   ? Enlargement of right atrium 01/23/2020  ? Essential hypertension   ? GERD (gastroesophageal reflux disease)   ? Glaucoma   ? Hiatal hernia   ? Hyperlipidemia   ? Internal hemorrhoids   ? Laryngospasm   ? Mitral regurgitation   ? mild to moderate by echo 12/2019 and moderate by TEE 12/2020  ? Multinodular goiter   ? Osteopenia   ? Postmenopausal   ? Pulmonary HTN ([place])   ?  Moderate with PASP 44mmHg by echo 2018 likely Group 2 from pulmonary venous HTN from CHF>>normal PAP by echo 12/2019  ? Radiation 10/22/14-11/23/14  ? Left Breast  ? Stricture and stenosis of cervix   ? ?Past Surgical History:  ?Procedure Laterality Date  ? BREAST LUMPECTOMY Left   ? BREAST SURGERY    ? CARDIOVERSION N/A 06/24/2015  ? Procedure: CARDIOVERSION;  Surgeon: Sueanne Margarita, MD;  Location: [place];  Service: Cardiovascular;  Laterality: N/A;  ? CARDIOVERSION N/A 05/19/2016  ? Procedure: CARDIOVERSION;  Surgeon: Sueanne Margarita, MD;  Location: [facility] ENDOSCOPY;  Service: Cardiovascular;  Laterality: N/A;  ? COLONOSCOPY    ? LUNG CANCER SURGERY  2003  ? resection carcinoid lingula-lt upper lobe  ? RADIOACTIVE SEED GUIDED PARTIAL MASTECTOMY WITH AXILLARY SENTINEL LYMPH NODE BIOPSY Left 06/26/2014  ? Procedure: RADIOACTIVE SEED GUIDED PARTIAL MASTECTOMY  WITH AXILLARY SENTINEL LYMPH NODE BIOPSY;  Surgeon: Stark Klein, MD;  Location: [place];  Service: General;  Laterality: Left;  ? RE-EXCISION OF BREAST LUMPECTOMY Left 08/07/2014  ? Procedure: RE-EXCISION OF LEFT BREAST LUMPECTOMY;  Surgeon: Stark Klein, MD;  Location: [place];  Service: General;  Laterality: Left;  ? RIGHT HEART CATH N/A 04/27/2016  ? Procedure: Right Heart Cath;  Surgeon: Larey Dresser, MD;  Location: [place] CV LAB;  Service: Cardiovascular;  Laterality:  N/A;  ? TEE WITHOUT CARDIOVERSION N/A 01/07/2021  ? Procedure: TRANSESOPHAGEAL ECHOCARDIOGRAM (TEE);  Surgeon: Acie Fredrickson Wonda Cheng, MD;  Location: [place];  Service: Cardiovascular;  Laterality: N/A;  ? ? ?Allergies ? ?Allergies  ?Allergen Reactions  ? Combigan [Brimonidine Tartrate-Timolol] Itching  ?  Eyes itch, reddened  ? Other Other (See Comments)  ?  Per patient made OU red, Sore, and sensitivity to light  ? Sulfa Antibiotics Rash  ? Sulfamethoxazole-Trimethoprim Hives  ? ? ?History of Present Illness  ?  ?Patricia [name] is a 81 y.o. female who presents via audio/video conferencing for a telehealth visit today.  Pt was last seen in cardiology clinic on 04/17/21 by Dr. Radford Pax.  At that time Keishawn Darsey was doing well.  The patient is now pending kyphoplasty.  Biopsy.  Since her last visit, she okay. ? ?Patient with limited ambulation/activity due to pain from back.  However, able to do ADLs and household chores without significant problem.  She denies chest pain, shortness of breath, dizziness, orthopnea, PND, syncope, lower extremity edema or melena.  Compliant with medication. ?Home Medications  ?  ?Prior to Admission medications   ?Medication Sig Start Date End Date Taking? Authorizing Provider  ?ADVAIR HFA 230-21 MCG/ACT inhaler USE 2 INHALATIONS TWICE A DAY 03/27/21   Abonza, Maritza, PA-C  ?albuterol (PROVENTIL HFA;VENTOLIN HFA) 108 (90 Base) MCG/ACT inhaler Inhale 2 puffs into the lungs every 6 (six) hours as needed for wheezing or shortness of breath.    [provider]  ?apixaban (ELIQUIS) 5 MG TABS tablet TAKE 1 TABLET TWICE A DAY 12/18/20   Sueanne Margarita, MD  ?Apoaequorin (PREVAGEN) 10 MG CAPS Take 1 capsule by mouth every morning. 11/28/20   Ronnell Freshwater, NP  ?ARMOUR THYROID 15 MG tablet TAKE 1 TABLET DAILY ALONG WITH 60 MG 06/16/21   Lorrene Reid, PA-C  ?ARMOUR THYROID 60 MG tablet TAKE 1 TABLET DAILY BEFORE BREAKFAST 04/21/21   Abonza, Herb Grays, PA-C  ?atorvastatin (LIPITOR) 40 MG  tablet TAKE 1 TABLET DAILY ?Patient taking differently: Take 40 mg by mouth daily before supper. 09/04/20   Sueanne Margarita, MD  ?bimatoprost (LUMIGAN) 0.01 % SOLN Place 1 drop into both eyes 2 (two) times daily.    [provider]  ?calcium-vitamin D (OSCAL WITH D) 500-200 MG-UNIT tablet Take 1 tablet by mouth daily with breakfast. 11/03/17   Opalski, Neoma Laming, DO  ?diltiazem (CARDIZEM CD) 300 MG 24 hr capsule TAKE 1 CAPSULE DAILY 02/11/21   Sueanne Margarita, MD  ?dorzolamide-timolol (COSOPT) 22.3-6.8 MG/ML ophthalmic solution Place 1 drop into both eyes at bedtime.    [provider]  ?fluticasone (FLONASE) 50 MCG/ACT nasal spray 1 spray each nostril following sinus rinses twice daily 02/08/18   Mellody Dance, DO  ?furosemide (LASIX) 40 MG tablet Take 1 tablet (40 mg total) by mouth 2 (two) times daily. 04/30/21   Sueanne Margarita, MD  ?gabapentin (NEURONTIN) 400 MG capsule TAKE 1 CAPSULE TWICE A DAY 11/05/20  Lorrene Reid, PA-C  ?L-Methylfolate-B12-B6-B2 (CEREFOLIN) 07-31-48-5 MG TABS TAKE 1 TABLET BY MOUTH EVERY MORNING 03/24/21   Garvin Fila, MD  ?levETIRAcetam (KEPPRA XR) 500 MG 24 hr tablet TAKE 1 TABLET DAILY ?Patient taking differently: Take 500 mg by mouth every evening. 09/04/20   Garvin Fila, MD  ?loratadine (CLARITIN) 10 MG tablet Take 10 mg by mouth daily.    [provider]  ?memantine (NAMENDA) 10 MG tablet Take 1 tablet (10 mg total) by mouth 2 (two) times daily. 03/07/21   Frann Rider, NP  ?metoprolol succinate (TOPROL-XL) 50 MG 24 hr tablet Take 1.5 tablets (75 mg total) by mouth daily. Take with or immediately following a meal. ?Patient taking differently: Take 75 mg by mouth every evening. Take with or immediately following a meal. 12/31/20   Turner, Eber Hong, MD  ?Multiple Vitamins-Iron (MULTIVITAMIN/IRON) TABS Take 1 tablet by mouth daily.    [provider]  ?Netarsudil Dimesylate (RHOPRESSA) 0.02 % SOLN Place 1 drop into both eyes at bedtime.    [provider]  ?pantoprazole (PROTONIX) 40 MG tablet TAKE 1 TABLET DAILY 03/20/21   Ronnell Freshwater, NP  ?potassium chloride SA (KLOR-CON) 20 MEQ tablet TAKE 1 TABLET TWICE A DAY (KEEP UPCOMING APPOINTMENT IN

## 2021-06-25 ENCOUNTER — Ambulatory Visit
Admission: RE | Admit: 2021-06-25 | Discharge: 2021-06-25 | Disposition: A | Discharge status: Home / Self Care | Payer: Medicare Other | Source: Ambulatory Visit | Attending: Hematology & Oncology | Admitting: Hematology & Oncology

## 2021-06-25 DIAGNOSIS — Z170 Estrogen receptor positive status [ER+]: Secondary | ICD-10-CM

## 2021-06-25 DIAGNOSIS — M898X8 Other specified disorders of bone, other site: Secondary | ICD-10-CM | POA: Diagnosis not present

## 2021-06-25 DIAGNOSIS — C7951 Secondary malignant neoplasm of bone: Secondary | ICD-10-CM

## 2021-06-25 DIAGNOSIS — M8458XA Pathological fracture in neoplastic disease, other specified site, initial encounter for fracture: Secondary | ICD-10-CM

## 2021-06-25 NOTE — Consult Note (Signed)
? ? ?Chief Complaint: ?Patient was seen in consultation today for L5 vertebral body lesion at the request of Ennever,Peter R ? ?Referring Physician(s): ?Ennever,Peter R ? ?History of Present Illness: ?Patricia [name] is a 81 y.o. female with a history of lung carcinoma resected 2007, breast carcinoma status postresection 2018, who noted low back pain while playing golf in February of this year.  This was initially treated as a sprain but when the symptoms did not resolve MR was performed demonstrating lesion involving the L5 vertebral body, without fracture or loss of height.  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  There is also a small lesion involving left ninth rib posteriorly.  Her pain is mainly in the low back and right hip.  She rates her pain 6 out of 10.  She scores 19 points on the [employer] disability questionnaire. ? ?Past Medical History:  ?Diagnosis Date  ? Allergic rhinitis, cause unspecified   ? Aortic regurgitation 06/17/2015  ? mild by echo 2021  ? Arthritis   ? in my fingers  ? Cancer [facility])   ? 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 Chronic atrial fibrillation (HCC)   ? a. Dx 04/2015 at time of stroke. b. DCCV 06/2015 - did not hold. Amio started then stopped due to QT prolongation.  ? Chronic cough   ? Chronic diastolic heart failure ([place]) 06/17/2015  ? CKD (chronic kidney disease), stage III ([place])   ? Cough variant asthma   ? Disease of pharynx or nasopharynx   ? Diverticulosis   ? Enlargement of right atrium 01/23/2020  ? Essential hypertension   ? GERD (gastroesophageal reflux disease)   ? Glaucoma   ? Hiatal hernia   ? Hyperlipidemia   ? Internal hemorrhoids   ? Laryngospasm   ? Mitral regurgitation   ? mild to moderate by echo 12/2019 and moderate by TEE 12/2020  ? Multinodular goiter   ? Osteopenia   ? Postmenopausal   ? Pulmonary HTN ([place])   ?  Moderate with PASP 51mmHg by echo 2018 likely Group 2 from pulmonary venous HTN from CHF>>normal PAP by echo  12/2019  ? Radiation 10/22/14-11/23/14  ? Left Breast  ? Stricture and stenosis of cervix   ? ? ?Past Surgical History:  ?Procedure Laterality Date  ? BREAST LUMPECTOMY Left   ? BREAST SURGERY    ? CARDIOVERSION N/A 06/24/2015  ? Procedure: CARDIOVERSION;  Surgeon: Sueanne Margarita, MD;  Location: [place];  Service: Cardiovascular;  Laterality: N/A;  ? CARDIOVERSION N/A 05/19/2016  ? Procedure: CARDIOVERSION;  Surgeon: Sueanne Margarita, MD;  Location: [facility] ENDOSCOPY;  Service: Cardiovascular;  Laterality: N/A;  ? COLONOSCOPY    ? LUNG CANCER SURGERY  2003  ? resection carcinoid lingula-lt upper lobe  ? RADIOACTIVE SEED GUIDED PARTIAL MASTECTOMY WITH AXILLARY SENTINEL LYMPH NODE BIOPSY Left 06/26/2014  ? Procedure: RADIOACTIVE SEED GUIDED PARTIAL MASTECTOMY WITH AXILLARY SENTINEL LYMPH NODE BIOPSY;  Surgeon: Stark Klein, MD;  Location: [place];  Service: General;  Laterality: Left;  ? RE-EXCISION OF BREAST LUMPECTOMY Left 08/07/2014  ? Procedure: RE-EXCISION OF LEFT BREAST LUMPECTOMY;  Surgeon: Stark Klein, MD;  Location: [place];  Service: General;  Laterality: Left;  ? RIGHT HEART CATH N/A 04/27/2016  ? Procedure: Right Heart Cath;  Surgeon: Larey Dresser, MD;  Location: [place] CV LAB;  Service: Cardiovascular;  Laterality: N/A;  ? TEE WITHOUT CARDIOVERSION N/A 01/07/2021  ? Procedure: TRANSESOPHAGEAL ECHOCARDIOGRAM (  TEE);  Surgeon: Acie Fredrickson Wonda Cheng, MD;  Location: [place];  Service: Cardiovascular;  Laterality: N/A;  ? ? ?Allergies: ?Combigan [brimonidine tartrate-timolol], Other, Sulfa antibiotics, and Sulfamethoxazole-trimethoprim ? ?Medications: ?Prior to Admission medications   ?Medication Sig Start Date End Date Taking? Authorizing Provider  ?ADVAIR HFA 230-21 MCG/ACT inhaler USE 2 INHALATIONS TWICE A DAY 03/27/21   Abonza, Maritza, PA-C  ?albuterol (PROVENTIL HFA;VENTOLIN HFA) 108 (90 Base) MCG/ACT inhaler Inhale 2 puffs into the lungs every 6 (six) hours as needed for  wheezing or shortness of breath.    [provider]  ?apixaban (ELIQUIS) 5 MG TABS tablet TAKE 1 TABLET TWICE A DAY 12/18/20   Sueanne Margarita, MD  ?Apoaequorin (PREVAGEN) 10 MG CAPS Take 1 capsule by mouth every morning. 11/28/20   Ronnell Freshwater, NP  ?ARMOUR THYROID 15 MG tablet TAKE 1 TABLET DAILY ALONG WITH 60 MG 06/16/21   Lorrene Reid, PA-C  ?ARMOUR THYROID 60 MG tablet TAKE 1 TABLET DAILY BEFORE BREAKFAST 04/21/21   Abonza, Herb Grays, PA-C  ?atorvastatin (LIPITOR) 40 MG tablet TAKE 1 TABLET DAILY ?Patient taking differently: Take 40 mg by mouth daily before supper. 09/04/20   Sueanne Margarita, MD  ?bimatoprost (LUMIGAN) 0.01 % SOLN Place 1 drop into both eyes 2 (two) times daily.    [provider]  ?calcium-vitamin D (OSCAL WITH D) 500-200 MG-UNIT tablet Take 1 tablet by mouth daily with breakfast. 11/03/17   Opalski, Neoma Laming, DO  ?diltiazem (CARDIZEM CD) 300 MG 24 hr capsule TAKE 1 CAPSULE DAILY 02/11/21   Sueanne Margarita, MD  ?dorzolamide-timolol (COSOPT) 22.3-6.8 MG/ML ophthalmic solution Place 1 drop into both eyes at bedtime.    [provider]  ?fluticasone (FLONASE) 50 MCG/ACT nasal spray 1 spray each nostril following sinus rinses twice daily 02/08/18   Mellody Dance, DO  ?furosemide (LASIX) 40 MG tablet Take 1 tablet (40 mg total) by mouth 2 (two) times daily. 04/30/21   Sueanne Margarita, MD  ?gabapentin (NEURONTIN) 400 MG capsule TAKE 1 CAPSULE TWICE A DAY 11/05/20   Lorrene Reid, PA-C  ?L-Methylfolate-B12-B6-B2 (CEREFOLIN) 07-31-48-5 MG TABS TAKE 1 TABLET BY MOUTH EVERY MORNING 03/24/21   Garvin Fila, MD  ?levETIRAcetam (KEPPRA XR) 500 MG 24 hr tablet TAKE 1 TABLET DAILY ?Patient taking differently: Take 500 mg by mouth every evening. 09/04/20   Garvin Fila, MD  ?loratadine (CLARITIN) 10 MG tablet Take 10 mg by mouth daily.    [provider]  ?memantine (NAMENDA) 10 MG tablet Take 1 tablet (10 mg total) by mouth 2 (two) times daily. 03/07/21   Frann Rider, NP   ?metoprolol succinate (TOPROL-XL) 50 MG 24 hr tablet Take 1.5 tablets (75 mg total) by mouth daily. Take with or immediately following a meal. ?Patient taking differently: Take 75 mg by mouth every evening. Take with or immediately following a meal. 12/31/20   Turner, Eber Hong, MD  ?Multiple Vitamins-Iron (MULTIVITAMIN/IRON) TABS Take 1 tablet by mouth daily.    [provider]  ?Netarsudil Dimesylate (RHOPRESSA) 0.02 % SOLN Place 1 drop into both eyes at bedtime.    [provider]  ?pantoprazole (PROTONIX) 40 MG tablet TAKE 1 TABLET DAILY 03/20/21   Ronnell Freshwater, NP  ?potassium chloride SA (KLOR-CON) 20 MEQ tablet TAKE 1 TABLET TWICE A DAY (KEEP UPCOMING APPOINTMENT IN SEPTEMBER WITH DR TURNER BEFORE ANYMORE REFILLS) 12/31/20   Sueanne Margarita, MD  ?traMADol (ULTRAM) 50 MG tablet Take 1 tablet (50 mg total) by mouth every  6 (six) hours as needed. 06/09/21   Volanda Napoleon, MD  ?Vitamin D, Ergocalciferol, (DRISDOL) 1.25 MG (50000 UNIT) CAPS capsule Take one capsule weekly ?Patient taking differently: Take 50,000 Units by mouth every 7 (seven) days. Sunday 11/28/20   Ronnell Freshwater, NP  ?  ? ?Family History  ?Problem Relation Age of Onset  ? Stroke Mother   ? Hypertension Mother   ? Hyperlipidemia Mother   ? Stroke Father   ? Transient ischemic attack Father   ? Hyperlipidemia Father   ? Hypertension Father   ? Atrial fibrillation Son   ? Heart attack Neg Hx   ? ? ?Social History  ? ?Socioeconomic History  ? Marital status: Married  ?  Spouse name: [name]  ? Number of children: Not on file  ? Years of education: Not on file  ? Highest education level: Not on file  ?Occupational History  ? Not on file  ?Tobacco Use  ? Smoking status: Former  ?  Packs/day: 0.75  ?  Years: 5.00  ?  Pack years: 3.75  ?  Types: Cigarettes  ?  Quit date: 06/15/1966  ?  Years since quitting: 55.0  ? Smokeless tobacco: Never  ?Vaping Use  ? Vaping Use: Never used  ?Substance and Sexual Activity  ? Alcohol use: Yes  ?   Alcohol/week: 0.0 standard drinks  ?  Comment: occasional wine  ? Drug use: No  ? Sexual activity: Not Currently  ?Other Topics Concern  ? Not on file  ?Social History Narrative  ? Retired. Lives [place]

## 2021-06-26 ENCOUNTER — Ambulatory Visit
Admission: RE | Admit: 2021-06-26 | Discharge: 2021-06-26 | Disposition: A | Discharge status: Home / Self Care | Payer: Medicare Other | Source: Ambulatory Visit | Attending: Physician Assistant | Admitting: Physician Assistant

## 2021-06-26 ENCOUNTER — Encounter: Payer: Self-pay | Admitting: *Deleted

## 2021-06-26 DIAGNOSIS — Z1231 Encounter for screening mammogram for malignant neoplasm of breast: Secondary | ICD-10-CM | POA: Diagnosis not present

## 2021-06-26 NOTE — Progress Notes (Signed)
Patient is scheduled for biopsy on 06/30/21. ? ?Oncology Nurse Navigator Documentation ? ? ?  06/26/2021  ?  9:15 AM  ?Oncology Nurse Navigator Flowsheets  ?Navigator Follow Up Date: 06/30/2021  ?Navigator Follow Up Reason: Other:  ?Financial planner  ?Navigator Encounter Type Appt/Treatment Plan Review  ?Patient Visit Type MedOnc  ?Treatment Phase Abnormal Scans  ?Barriers/Navigation Needs Coordination of Care;Employed  ?Interventions None Required  ?Acuity Level 2-Minimal Needs (1-2 Barriers Identified)  ?Support Groups/Services Friends and Family  ?Time Spent with Patient 15  ?  ?

## 2021-06-27 ENCOUNTER — Other Ambulatory Visit: Payer: Self-pay | Admitting: Physician Assistant

## 2021-06-27 DIAGNOSIS — R928 Other abnormal and inconclusive findings on diagnostic imaging of breast: Secondary | ICD-10-CM

## 2021-06-29 DIAGNOSIS — Z20822 Contact with and (suspected) exposure to covid-19: Secondary | ICD-10-CM | POA: Diagnosis not present

## 2021-06-30 ENCOUNTER — Ambulatory Visit
Admission: RE | Admit: 2021-06-30 | Discharge: 2021-06-30 | Disposition: A | Discharge status: Home / Self Care | Payer: Medicare Other | Source: Ambulatory Visit | Attending: Hematology & Oncology | Admitting: Hematology & Oncology

## 2021-06-30 ENCOUNTER — Other Ambulatory Visit (HOSPITAL_COMMUNITY)
Admission: RE | Admit: 2021-06-30 | Discharge: 2021-06-30 | Disposition: A | Discharge status: Home / Self Care | Payer: Medicare Other | Source: Ambulatory Visit | Attending: Interventional Radiology | Admitting: Interventional Radiology

## 2021-06-30 DIAGNOSIS — Z170 Estrogen receptor positive status [ER+]: Secondary | ICD-10-CM

## 2021-06-30 DIAGNOSIS — C7981 Secondary malignant neoplasm of breast: Secondary | ICD-10-CM | POA: Diagnosis not present

## 2021-06-30 DIAGNOSIS — C7951 Secondary malignant neoplasm of bone: Secondary | ICD-10-CM

## 2021-06-30 DIAGNOSIS — M898X8 Other specified disorders of bone, other site: Secondary | ICD-10-CM | POA: Diagnosis not present

## 2021-06-30 DIAGNOSIS — M8458XA Pathological fracture in neoplastic disease, other specified site, initial encounter for fracture: Secondary | ICD-10-CM

## 2021-06-30 DIAGNOSIS — N6039 Fibrosclerosis of unspecified breast: Secondary | ICD-10-CM | POA: Diagnosis not present

## 2021-06-30 DIAGNOSIS — C801 Malignant (primary) neoplasm, unspecified: Secondary | ICD-10-CM | POA: Diagnosis not present

## 2021-06-30 MED ORDER — SODIUM CHLORIDE 0.9 % IV SOLN: INTRAVENOUS | Status: DC

## 2021-06-30 MED ORDER — FENTANYL CITRATE PF 50 MCG/ML IJ SOSY
25.0000 ug | PREFILLED_SYRINGE | INTRAMUSCULAR | Status: DC | PRN
  Administered 2021-06-30: 25 ug via INTRAVENOUS

## 2021-06-30 MED ORDER — MIDAZOLAM HCL 2 MG/2ML IJ SOLN
1.0000 mg | INTRAMUSCULAR | Status: DC | PRN
  Administered 2021-06-30 (×2): 0.5 mg via INTRAVENOUS

## 2021-06-30 MED ORDER — KETOROLAC TROMETHAMINE 30 MG/ML IJ SOLN
30.0000 mg | Freq: Once | INTRAMUSCULAR | Status: AC
  Administered 2021-06-30: 30 mg via INTRAVENOUS

## 2021-06-30 NOTE — Progress Notes (Signed)
Pt back in nursing recovery area. Alert and oriented. Pt follows commands, talks in complete sentences and has no complaints at this time. Pt will remain in nursing station until discharge.  ?

## 2021-06-30 NOTE — Discharge Instructions (Addendum)
?  Bone Biopsy Post Procedure Discharge Instructions ? ?May resume a regular diet and any medications that you routinely take (including pain medications). However, if you are taking Aspirin or an anticoagulant/blood thinner you will be told when you can resume taking these by the healthcare provider. ?No driving day of procedure. ?The day of your procedure take it easy. You may use an ice pack as needed to injection sites on back.  Ice to back 30 minutes on and 30 minutes off, as needed. ?May remove bandaids tomorrow after taking a shower. Replace daily with a clean bandaid until healed.  ?Do not lift anything heavier than a milk jug for 1-2 weeks or determined by your physician.  ?Follow up with your physician in 2 weeks. ? ? ? ?Please contact our office at [phone] for the following symptoms or if you have any questions: ? ?Fever greater than 100 degrees ?Increased swelling, pain, or redness at injection site. ?Increased back and/or leg pain ?New numbness or change in symptoms from before the procedure.  ? ? ?Thank you for visiting [facility].  ? ?RESUME ELIQUIS 24 HOURS AFTER PROCEDURE. ?

## 2021-07-02 ENCOUNTER — Encounter: Payer: Self-pay | Admitting: *Deleted

## 2021-07-02 NOTE — Progress Notes (Signed)
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  ? ?Received the following message from Dr Marin Olp: ? ?I need to get her in as a new patient and talk to her about the treatment options.  We need the ER/PR/HER2 BEFORE I see her back!!! Please be on the lookout for these!!  I called her and left a message.  Pete ? ?Spoke to Zalma in pathology and they state that results should be back tomorrow.  ? ?Oncology Nurse Navigator Documentation ? ? ?  07/02/2021  ?  2:15 PM  ?Oncology Nurse Navigator Flowsheets  ?Confirmed Diagnosis Date 06/30/2021  ?Diagnosis Status Confirmed Diagnosis Recurrent  ?Navigator Follow Up Date: 07/03/2021  ?Navigator Follow Up Reason: Pathology  ?Navigator Location [facility]  ?Navigator Encounter Type Telephone  ?Telephone Outgoing Call;Appt Confirmation/Clarification  ?Patient Visit Type MedOnc  ?Treatment Phase Abnormal Scans  ?Barriers/Navigation Needs Coordination of Care;Employed  ?Interventions Coordination of Care;Education  ?Acuity Level 2-Minimal Needs (1-2 Barriers Identified)  ?Coordination of Care Appts  ?Education Method Verbal  ?Support Groups/Services Friends and Family  ?Time Spent with Patient 15  ?  ?

## 2021-07-03 ENCOUNTER — Encounter: Payer: Self-pay | Admitting: *Deleted

## 2021-07-03 NOTE — Progress Notes (Signed)
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  ?Her2 by FISH will be performed and the results will be reported  ?separately.  ? ?Estrogen Receptor:       Negative (0)  ?Progesterone Receptor:   Negative (0) ? ?Results also given to Dr Ennever. ? ?Oncology Nurse Navigator Documentation ? ? ?  07/03/2021  ?  1:45 PM  ?Oncology Nurse Navigator Flowsheets  ?Navigator Follow Up Date: 07/04/2021  ?Navigator Follow Up Reason: Follow-up Appointment  ?Navigator Location [facility]  ?Navigator Encounter Type Pathology Review  ?Patient Visit Type MedOnc  ?Treatment Phase Abnormal Scans  ?Barriers/Navigation Needs Coordination of Care;Employed  ?Interventions Other  ?Acuity Level 2-Minimal Needs (1-2 Barriers Identified)  ?Support Groups/Services Friends and Family  ?Time Spent with Patient 15  ?  ?

## 2021-07-04 ENCOUNTER — Encounter: Payer: Self-pay | Admitting: *Deleted

## 2021-07-04 ENCOUNTER — Inpatient Hospital Stay: Payer: Medicare Other

## 2021-07-04 ENCOUNTER — Inpatient Hospital Stay: Payer: Medicare Other | Attending: Hematology & Oncology | Admitting: Hematology & Oncology

## 2021-07-04 ENCOUNTER — Encounter: Payer: Self-pay | Admitting: Hematology & Oncology

## 2021-07-04 VITALS — BP 96/69 | HR 93 | Temp 98.2°F | Resp 20 | Wt 154.0 lb

## 2021-07-04 DIAGNOSIS — Z171 Estrogen receptor negative status [ER-]: Secondary | ICD-10-CM | POA: Insufficient documentation

## 2021-07-04 DIAGNOSIS — Z170 Estrogen receptor positive status [ER+]: Secondary | ICD-10-CM

## 2021-07-04 DIAGNOSIS — C50412 Malignant neoplasm of upper-outer quadrant of left female breast: Secondary | ICD-10-CM

## 2021-07-04 DIAGNOSIS — C50912 Malignant neoplasm of unspecified site of left female breast: Secondary | ICD-10-CM | POA: Diagnosis not present

## 2021-07-04 DIAGNOSIS — C7951 Secondary malignant neoplasm of bone: Secondary | ICD-10-CM | POA: Diagnosis not present

## 2021-07-04 LAB — CMP (CANCER CENTER ONLY)
ALT: 18 U/L (ref 0–44)
AST: 22 U/L (ref 15–41)
Albumin: 4.3 g/dL (ref 3.5–5.0)
Alkaline Phosphatase: 105 U/L (ref 38–126)
Anion gap: 8 (ref 5–15)
BUN: 23 mg/dL (ref 8–23)
CO2: 31 mmol/L (ref 22–32)
Calcium: 9.6 mg/dL (ref 8.9–10.3)
Chloride: 101 mmol/L (ref 98–111)
Creatinine: 1.22 mg/dL — ABNORMAL HIGH (ref 0.44–1.00)
GFR, Estimated: 45 mL/min — ABNORMAL LOW (ref >60)
Glucose, Bld: 95 mg/dL (ref 70–99)
Potassium: 4.6 mmol/L (ref 3.5–5.1)
Sodium: 140 mmol/L (ref 135–145)
Total Bilirubin: 0.6 mg/dL (ref 0.3–1.2)
Total Protein: 7.1 g/dL (ref 6.5–8.1)

## 2021-07-04 LAB — CBC WITH DIFFERENTIAL (CANCER CENTER ONLY)
Abs Immature Granulocytes: 0.03 10*3/uL (ref 0.00–0.07)
Basophils Absolute: 0 10*3/uL (ref 0.0–0.1)
Basophils Relative: 0 %
Eosinophils Absolute: 0 10*3/uL (ref 0.0–0.5)
Eosinophils Relative: 0 %
HCT: 44.9 % (ref 36.0–46.0)
Hemoglobin: 15.1 g/dL — ABNORMAL HIGH (ref 12.0–15.0)
Immature Granulocytes: 0 %
Lymphocytes Relative: 21 %
Lymphs Abs: 1.4 10*3/uL (ref 0.7–4.0)
MCH: 32.2 pg (ref 26.0–34.0)
MCHC: 33.6 g/dL (ref 30.0–36.0)
MCV: 95.7 fL (ref 80.0–100.0)
Monocytes Absolute: 0.6 10*3/uL (ref 0.1–1.0)
Monocytes Relative: 9 %
Neutro Abs: 4.6 10*3/uL (ref 1.7–7.7)
Neutrophils Relative %: 70 %
Platelet Count: 247 10*3/uL (ref 150–400)
RBC: 4.69 MIL/uL (ref 3.87–5.11)
RDW: 13.4 % (ref 11.5–15.5)
WBC Count: 6.7 10*3/uL (ref 4.0–10.5)
nRBC: 0 % (ref 0.0–0.2)

## 2021-07-04 LAB — SURGICAL PATHOLOGY

## 2021-07-04 LAB — LACTATE DEHYDROGENASE: LDH: 255 U/L — ABNORMAL HIGH (ref 98–192)

## 2021-07-04 NOTE — Progress Notes (Signed)
?Hematology and Oncology Follow Up Visit ? ?Patricia [name] ?[id number] ?[birth year] 81 y.o. ?07/04/2021 ? ? 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 ?Current Therapy:   ?Radiation therapy to the L5 vertebral body-start on 07/07/2021 ?Xgeva 120 mg subcu every 3 months-first dose on 06/2021 ?    ?Interim History:  Patricia [name] is back for follow-up.  We have done our staging studies on her.  She had a PET scan that was done.  This was done on 06/13/2021.  Shockingly, the PET scan only showed activity at the L5 vertebral body.  There is no evidence of disease elsewhere. ? ?She subsequently underwent a biopsy of this area.  This was done on 06/30/2021.  The pathology report ([id number]) showed metastatic carcinoma which appear to be adenocarcinoma.  This was consistent with a breast primary. ? ?Surprisingly, the tumor was ER-/PR-.  The tumor was HER2 equivocal. ? ?She feels well.  She is not having any real back pain.  She is not having any nausea or vomiting.  There is no leg swelling.  She has had no change in bowel or bladder habits.  She has had no cough or shortness of breath. ? ?I must say that this is quite unusual.  She has a solitary metastatic focus. ? ?I think that she would benefit from radiation therapy.  I will have to speak with Radiation Oncology about this. ? ?I think was good to be important is whether or not the tumor does have HER2 positivity. ? ?I really would like to avoid having to treat her as systemically.  Obviously, she must have micrometastatic disease somewhere.  She is not symptomatic.  We will see what her CA 27.29 is. ? ?I really think that we can try to avoid systemic therapy.  We are not going to cure this.  I would like to see if metastatic disease does show up and if it does, then we can consider systemic therapy on her. ? ?Currently, I would say performance status is probably ECOG 0. ? ?Medications:  ?Current Outpatient Medications:   ?  ADVAIR HFA 254-27 MCG/ACT inhaler, USE 2 INHALATIONS TWICE A DAY, Disp: 36 g, Rfl: 3 ?  apixaban (ELIQUIS) 5 MG TABS tablet, TAKE 1 TABLET TWICE A DAY, Disp: 180 tablet, Rfl: 1 ?  Apoaequorin (PREVAGEN) 10 MG CAPS, Take 1 capsule by mouth every morning., Disp: 90 capsule, Rfl: 3 ?  ARMOUR THYROID 15 MG tablet, TAKE 1 TABLET DAILY ALONG WITH 60 MG, Disp: 90 tablet, Rfl: 1 ?  ARMOUR THYROID 60 MG tablet, TAKE 1 TABLET DAILY BEFORE BREAKFAST, Disp: 90 tablet, Rfl: 1 ?  atorvastatin (LIPITOR) 40 MG tablet, TAKE 1 TABLET DAILY (Patient taking differently: Take 40 mg by mouth daily before supper.), Disp: 90 tablet, Rfl: 3 ?  bimatoprost (LUMIGAN) 0.01 % SOLN, Place 1 drop into both eyes 2 (two) times daily., Disp: , Rfl:  ?  calcium-vitamin D (OSCAL WITH D) 500-200 MG-UNIT tablet, Take 1 tablet by mouth daily with breakfast., Disp: 30 tablet, Rfl: 0 ?  diltiazem (CARDIZEM CD) 300 MG 24 hr capsule, TAKE 1 CAPSULE DAILY, Disp: 90 capsule, Rfl: 2 ?  dorzolamide-timolol (COSOPT) 22.3-6.8 MG/ML ophthalmic solution, Place 1 drop into both eyes at bedtime., Disp: , Rfl:  ?  fluticasone (FLONASE) 50 MCG/ACT nasal spray, 1 spray each nostril following sinus rinses twice daily, Disp: 16 g, Rfl: 2 ?  furosemide (LASIX) 40 MG tablet, Take 1 tablet (40 mg total) by  mouth 2 (two) times daily., Disp: 180 tablet, Rfl: 3 ?  gabapentin (NEURONTIN) 400 MG capsule, TAKE 1 CAPSULE TWICE A DAY, Disp: 180 capsule, Rfl: 3 ?  L-Methylfolate-B12-B6-B2 (CEREFOLIN) 07-31-48-5 MG TABS, TAKE 1 TABLET BY MOUTH EVERY MORNING, Disp: 90 tablet, Rfl: 3 ?  levETIRAcetam (KEPPRA XR) 500 MG 24 hr tablet, TAKE 1 TABLET DAILY (Patient taking differently: Take 500 mg by mouth every evening.), Disp: 90 tablet, Rfl: 3 ?  loratadine (CLARITIN) 10 MG tablet, Take 10 mg by mouth daily., Disp: , Rfl:  ?  memantine (NAMENDA) 10 MG tablet, Take 1 tablet (10 mg total) by mouth 2 (two) times daily., Disp: 60 tablet, Rfl: 5 ?  metoprolol succinate (TOPROL-XL) 50 MG 24 hr  tablet, Take 1.5 tablets (75 mg total) by mouth daily. Take with or immediately following a meal. (Patient taking differently: Take 75 mg by mouth every evening. Take with or immediately following a meal.), Disp: 135 tablet, Rfl: 3 ?  Multiple Vitamins-Iron (MULTIVITAMIN/IRON) TABS, Take 1 tablet by mouth daily., Disp: , Rfl:  ?  Netarsudil Dimesylate (RHOPRESSA) 0.02 % SOLN, Place 1 drop into both eyes at bedtime., Disp: , Rfl:  ?  pantoprazole (PROTONIX) 40 MG tablet, TAKE 1 TABLET DAILY, Disp: 90 tablet, Rfl: 3 ?  potassium chloride SA (KLOR-CON) 20 MEQ tablet, TAKE 1 TABLET TWICE A DAY (KEEP UPCOMING APPOINTMENT IN SEPTEMBER WITH DR TURNER BEFORE ANYMORE REFILLS), Disp: 180 tablet, Rfl: 3 ?  traMADol (ULTRAM) 50 MG tablet, Take 1 tablet (50 mg total) by mouth every 6 (six) hours as needed., Disp: 60 tablet, Rfl: 0 ?  Vitamin D, Ergocalciferol, (DRISDOL) 1.25 MG (50000 UNIT) CAPS capsule, Take one capsule weekly (Patient taking differently: Take 50,000 Units by mouth every 7 (seven) days. Sunday), Disp: 12 capsule, Rfl: 3 ?  albuterol (PROVENTIL HFA;VENTOLIN HFA) 108 (90 Base) MCG/ACT inhaler, Inhale 2 puffs into the lungs every 6 (six) hours as needed for wheezing or shortness of breath. (Patient not taking: Reported on 07/04/2021), Disp: , Rfl:  ?No current facility-administered medications for this visit. ? ?Facility-Administered Medications Ordered in Other Visits:  ?  denosumab (PROLIA) injection 60 mg, 60 mg, Subcutaneous, Once, Magrinat, Virgie Dad, MD ? ?Allergies:  ?Allergies  ?Allergen Reactions  ? Combigan [Brimonidine Tartrate-Timolol] Itching  ?  Eyes itch, reddened  ? Other Other (See Comments)  ?  Per patient made OU red, Sore, and sensitivity to light  ? Sulfa Antibiotics Rash  ? Sulfamethoxazole-Trimethoprim Hives  ? ? ?Past Medical History, Surgical history, Social history, and Family History were reviewed and updated. ? ?Review of Systems: ?Review of Systems  ?Constitutional: Negative.   ?HENT:   Negative.    ?Eyes: Negative.   ?Respiratory: Negative.    ?Cardiovascular: Negative.   ?Gastrointestinal: Negative.   ?Endocrine: Negative.   ?Genitourinary: Negative.    ?Musculoskeletal: Negative.   ?Skin: Negative.   ?Neurological: Negative.   ?Hematological: Negative.   ?Psychiatric/Behavioral: Negative.    ? ?Physical Exam: ? weight is 154 lb (69.9 kg). Her oral temperature is 98.2 ?F (36.8 ?C). Her blood pressure is 96/69 and her pulse is 93. Her respiration is 20 and oxygen saturation is 93%.  ? ?Wt Readings from Last 3 Encounters:  ?07/04/21 154 lb (69.9 kg)  ?06/25/21 145 lb (65.8 kg)  ?05/30/21 150 lb (68 kg)  ? ? ?Physical Exam ?Vitals reviewed.  ?HENT:  ?   Head: Normocephalic and atraumatic.  ?Eyes:  ?   Pupils: Pupils are equal, round, and  reactive to light.  ?Cardiovascular:  ?   Rate and Rhythm: Normal rate and regular rhythm.  ?   Heart sounds: Normal heart sounds.  ?Pulmonary:  ?   Effort: Pulmonary effort is normal.  ?   Breath sounds: Normal breath sounds.  ?Abdominal:  ?   General: Bowel sounds are normal.  ?   Palpations: Abdomen is soft.  ?Musculoskeletal:     ?   General: No tenderness or deformity. Normal range of motion.  ?   Cervical back: Normal range of motion.  ?Lymphadenopathy:  ?   Cervical: No cervical adenopathy.  ?Skin: ?   General: Skin is warm and dry.  ?   Findings: No erythema or rash.  ?Neurological:  ?   Mental Status: She is alert and oriented to person, place, and time.  ?Psychiatric:     ?   Behavior: Behavior normal.     ?   Thought Content: Thought content normal.     ?   Judgment: Judgment normal.  ? ? ? ?Lab Results  ?Component Value Date  ? WBC 6.7 07/04/2021  ? HGB 15.1 (H) 07/04/2021  ? HCT 44.9 07/04/2021  ? MCV 95.7 07/04/2021  ? PLT 247 07/04/2021  ? ?  Chemistry   ?   ?Component Value Date/Time  ? NA 141 05/30/2021 1037  ? NA 143 01/22/2021 1422  ? NA 141 09/22/2016 1459  ? K 4.1 05/30/2021 1037  ? K 3.8 09/22/2016 1459  ? CL 104 05/30/2021 1037  ? CO2 30  05/30/2021 1037  ? CO2 26 09/22/2016 1459  ? BUN 22 05/30/2021 1037  ? BUN 24 01/22/2021 1422  ? BUN 19.8 09/22/2016 1459  ? CREATININE 1.20 (H) 05/30/2021 1037  ? CREATININE 1.1 09/22/2016 1459  ?    ?Com

## 2021-07-04 NOTE — Progress Notes (Signed)
Patient and her husband here to discuss treatment after biopsy results. She is feeling well today. She tolerated bone biopsy well and said she had no pain, and site is well healed.  ? ?Patient is interested in proceeding with genetic testing and referral now that metastatic breast cancer is confirmed diagnosis. Will arrange for genetic blood collection next time she is in the office. ? ?Patient will be prescribed xgeva and radiation to treat her isolated mets. Referral order to radiation oncology placed.  ? ?Oncology Nurse Navigator Documentation ? ? ?  07/04/2021  ? 11:00 AM  ?Oncology Nurse Navigator Flowsheets  ?Navigator Follow Up Date: 08/13/2021  ?Navigator Follow Up Reason: Follow-up Appointment  ?Navigator Location [facility]  ?Navigator Encounter Type Follow-up Appt  ?Patient Visit Type MedOnc  ?Treatment Phase Pre-Tx/Tx Discussion  ?Barriers/Navigation Needs Coordination of Care;Employed  ?Interventions Education;Psycho-Social Support  ?Acuity Level 2-Minimal Needs (1-2 Barriers Identified)  ?Support Groups/Services Friends and Family  ?Time Spent with Patient 30  ?  ?

## 2021-07-05 LAB — CANCER ANTIGEN 27.29: CA 27.29: 29.3 U/mL (ref 0.0–38.6)

## 2021-07-07 ENCOUNTER — Encounter: Payer: Self-pay | Admitting: Oncology

## 2021-07-07 ENCOUNTER — Encounter: Payer: Self-pay | Admitting: *Deleted

## 2021-07-07 NOTE — Progress Notes (Signed)
Call let her know that the HER2 study was negative.  Thanks.  Pete  ? ?Patient was notified of results. Reviewed the implications for a negative results.  ? ?Patient is scheduled for new patient consultation with radiation oncology on 07/14/21. ? ?Oncology Nurse Navigator Documentation ? ? ?  07/07/2021  ? 11:15 AM  ?Oncology Nurse Navigator Flowsheets  ?Navigator Follow Up Date: 08/13/2021  ?Navigator Follow Up Reason: Follow-up Appointment  ?Navigator Location [facility]  ?Navigator Encounter Type Telephone  ?Telephone Diagnostic Results;Outgoing Call  ?Patient Visit Type MedOnc  ?Treatment Phase Pre-Tx/Tx Discussion  ?Barriers/Navigation Needs Coordination of Care;Employed  ?Interventions Education  ?Acuity Level 2-Minimal Needs (1-2 Barriers Identified)  ?Education Method Verbal;Teach-back  ?Support Groups/Services Friends and Family  ?Time Spent with Patient 15  ?  ?

## 2021-07-08 ENCOUNTER — Ambulatory Visit
Admission: RE | Admit: 2021-07-08 | Discharge: 2021-07-08 | Disposition: A | Discharge status: Home / Self Care | Payer: Medicare Other | Source: Ambulatory Visit | Attending: Physician Assistant | Admitting: Physician Assistant

## 2021-07-08 ENCOUNTER — Ambulatory Visit: Payer: Medicare Other

## 2021-07-08 DIAGNOSIS — R928 Other abnormal and inconclusive findings on diagnostic imaging of breast: Secondary | ICD-10-CM

## 2021-07-08 DIAGNOSIS — R922 Inconclusive mammogram: Secondary | ICD-10-CM | POA: Diagnosis not present

## 2021-07-10 ENCOUNTER — Inpatient Hospital Stay: Payer: Medicare Other

## 2021-07-10 VITALS — BP 97/67 | HR 85 | Temp 98.2°F

## 2021-07-10 DIAGNOSIS — C50912 Malignant neoplasm of unspecified site of left female breast: Secondary | ICD-10-CM | POA: Diagnosis not present

## 2021-07-10 DIAGNOSIS — M8580 Other specified disorders of bone density and structure, unspecified site: Secondary | ICD-10-CM

## 2021-07-10 DIAGNOSIS — C7951 Secondary malignant neoplasm of bone: Secondary | ICD-10-CM | POA: Diagnosis not present

## 2021-07-10 DIAGNOSIS — Z171 Estrogen receptor negative status [ER-]: Secondary | ICD-10-CM | POA: Diagnosis not present

## 2021-07-10 MED ORDER — DENOSUMAB 60 MG/ML ~~LOC~~ SOSY
60.0000 mg | PREFILLED_SYRINGE | Freq: Once | SUBCUTANEOUS | Status: AC
  Administered 2021-07-10: 60 mg via SUBCUTANEOUS
  Filled 2021-07-10: qty 1

## 2021-07-10 NOTE — Patient Instructions (Signed)
Denosumab injection ?What is this medication? ?DENOSUMAB (den oh sue mab) slows bone breakdown. Prolia is used to treat osteoporosis in women after menopause and in men, and in people who are taking corticosteroids for 6 months or more. Xgeva is used to treat a high calcium level due to cancer and to prevent bone fractures and other bone problems caused by multiple myeloma or cancer bone metastases. Xgeva is also used to treat giant cell tumor of the bone. ?This medicine may be used for other purposes; ask your health care provider or pharmacist if you have questions. ?COMMON BRAND NAME(S): Prolia, XGEVA ?What should I tell my care team before I take this medication? ?They need to know if you have any of these conditions: ?dental disease ?having surgery or tooth extraction ?infection ?kidney disease ?low levels of calcium or Vitamin D in the blood ?malnutrition ?on hemodialysis ?skin conditions or sensitivity ?thyroid or parathyroid disease ?an unusual reaction to denosumab, other medicines, foods, dyes, or preservatives ?pregnant or trying to get pregnant ?breast-feeding ?How should I use this medication? ?This medicine is for injection under the skin. It is given by a health care professional in a hospital or clinic setting. ?A special MedGuide will be given to you before each treatment. Be sure to read this information carefully each time. ?For Prolia, talk to your pediatrician regarding the use of this medicine in children. Special care may be needed. For Xgeva, talk to your pediatrician regarding the use of this medicine in children. While this drug may be prescribed for children as young as 13 years for selected conditions, precautions do apply. ?Overdosage: If you think you have taken too much of this medicine contact a poison control center or emergency room at once. ?NOTE: This medicine is only for you. Do not share this medicine with others. ?What if I miss a dose? ?It is important not to miss your dose.  Call your doctor or health care professional if you are unable to keep an appointment. ?What may interact with this medication? ?Do not take this medicine with any of the following medications: ?other medicines containing denosumab ?This medicine may also interact with the following medications: ?medicines that lower your chance of fighting infection ?steroid medicines like prednisone or cortisone ?This list may not describe all possible interactions. Give your health care provider a list of all the medicines, herbs, non-prescription drugs, or dietary supplements you use. Also tell them if you smoke, drink alcohol, or use illegal drugs. Some items may interact with your medicine. ?What should I watch for while using this medication? ?Visit your doctor or health care professional for regular checks on your progress. Your doctor or health care professional may order blood tests and other tests to see how you are doing. ?Call your doctor or health care professional for advice if you get a fever, chills or sore throat, or other symptoms of a cold or flu. Do not treat yourself. This drug may decrease your body's ability to fight infection. Try to avoid being around people who are sick. ?You should make sure you get enough calcium and vitamin D while you are taking this medicine, unless your doctor tells you not to. Discuss the foods you eat and the vitamins you take with your health care professional. ?See your dentist regularly. Brush and floss your teeth as directed. Before you have any dental work done, tell your dentist you are receiving this medicine. ?Do not become pregnant while taking this medicine or for 5 months after   stopping it. Talk with your doctor or health care professional about your birth control options while taking this medicine. Women should inform their doctor if they wish to become pregnant or think they might be pregnant. There is a potential for serious side effects to an unborn child. Talk to  your health care professional or pharmacist for more information. ?What side effects may I notice from receiving this medication? ?Side effects that you should report to your doctor or health care professional as soon as possible: ?allergic reactions like skin rash, itching or hives, swelling of the face, lips, or tongue ?bone pain ?breathing problems ?dizziness ?jaw pain, especially after dental work ?redness, blistering, peeling of the skin ?signs and symptoms of infection like fever or chills; cough; sore throat; pain or trouble passing urine ?signs of low calcium like fast heartbeat, muscle cramps or muscle pain; pain, tingling, numbness in the hands or feet; seizures ?unusual bleeding or bruising ?unusually weak or tired ?Side effects that usually do not require medical attention (report to your doctor or health care professional if they continue or are bothersome): ?constipation ?diarrhea ?headache ?joint pain ?loss of appetite ?muscle pain ?runny nose ?tiredness ?upset stomach ?This list may not describe all possible side effects. Call your doctor for medical advice about side effects. You may report side effects to FDA at 1-800-FDA-1088. ?Where should I keep my medication? ?This medicine is only given in a clinic, doctor's office, or other health care setting and will not be stored at home. ?NOTE: This sheet is a summary. It may not cover all possible information. If you have questions about this medicine, talk to your doctor, pharmacist, or health care provider. ?? 2023 Elsevier/Gold Standard (2017-06-25 00:00:00) ? ?

## 2021-07-11 NOTE — Progress Notes (Signed)
Histology and Location of Primary Cancer:  ?Malignant neoplasm of upper-outer quadrant of left breast in female, estrogen receptor positive  ? ?Biopsies revealed:  ?06/30/2021 ?A. BONE, L5, BIOPSY:  ?-  Metastatic carcinoma  ?-  See comment  ?COMMENT:  ?The bone biopsy shows an infiltrative epithelioid proliferation with associated fibrosis.  By immunohistochemistry, the neoplastic cells are positive for cytokeratin 7, CD138 and GATA3 but negative for cytokeratin 20, TTF-1, CDX2, PAX8 and kappa and lambda by in situ hybridization. Overall, these findings are suggestive of a primary breast metastasis. Dr. Jordan reviewed the case and agrees with the above diagnosis.  ?Prognostic panel (ER, PR and HER2) are pending and will be reported in an addendum. ?ADDENDUM:  ?PROGNOSTIC INDICATOR RESULTS:  ?- Immunohistochemical and morphometric analysis performed manually  ?- The tumor cells are equivocal for Her2 (2+).  ?- Her2 by FISH will be performed and the results will be reported separately.  ?- Estrogen Receptor:       Negative (0)  ?- Progesterone Receptor:   Negative (0) ?ADDENDUM:  ?FLOURESCENCE IN-SITU HYBRIDIZATION RESULTS:  ?GROUP 5:   HER2 **NEGATIVE** ? ?Location(s) of Symptomatic tumor(s):  ?Restaging PET Scan ?--IMPRESSION: ?No PET-CT findings for locally recurrent left breast cancer. ?No findings for metastatic disease involving the chest or abdomen/pelvis. ?Lytic destructive bony lesion involving L5 and small hypermetabolic left ninth posterior rib abnormality. Findings worrisome for metastatic disease. ?Chronic lung disease and persistent small left effusion. ? ?Past/Anticipated chemotherapy by medical oncology, if any:  ?Under care of Dr. Peter Ennever ?07/04/2021 ?--Current Therapy:        ?Radiation therapy to the L5 vertebral body-start on 07/07/2021 ?Xgeva 120 mg subcu every 3 months-first dose on 06/2021 ?--Impression and Plan: ?She has a solitary metastatic focus from breast cancer.   ?Her initial breast  cancer was probably 7 years ago.  It was estrogen positive. ?Again, I wanted to try to hold off on systemic therapy for her since this is estrogen negative. ?I think even if we are HER2 positive, I probably would hold off on anti-HER2 therapy right now. ?She definitely will need radiation therapy.  Again we will speak with Radiation Oncology on Monday. ?We will have her on Xgeva.  We will see about giving her a dose next week.   ?She will have this every 3 months. ?I would like to have her come back to see me about 3 to 4 weeks after her radiation is completed.   ?I probably would not do another PET scan on her until late July or early August. ?I answered all of their questions.  Again we will see what the CA 27.29 looks like.  This may help us ? ?Pain on a scale of 0-10 is: Reports it comes and goes (located to lower spine). Rates it usually is a 3-4 out of 10  ? ?If Spine Met(s), symptoms, if any, include: ?Bowel/Bladder retention or incontinence (please describe): Denies any changes or concerns to her bowel and bladder habits.  ?Numbness or weakness in extremities (please describe): Patient denies ?Current Decadron regimen, if applicable: Not currently prescribed ? ?Ambulatory status? Walker? Wheelchair?: Reports she sometimes feels unsteady when she first stands, but otherwise is able to ambulate without any assistive device.  ? ?SAFETY ISSUES: ?Prior radiation? Yes: 10/22/2014-11/23/2014 ?left breast at 42.72 Gy at 2.67 Gy per fraction x 16 fractions  ?the left breast boost at 12 Gy at 2 Gy per fraction x 6 fractions ?Pacemaker/ICD? No ?Possible current pregnancy? No--postmenopausal ?Is the patient   on methotrexate? No ? ?Additional Complaints / other details:  Nothing else of note ? ?

## 2021-07-13 NOTE — Progress Notes (Signed)
?Radiation Oncology         [phone] ?________________________________ ? ?Initial Outpatient Consultation ? ?Name: Patricia [name] MRN: [id number]  ?Date: 07/14/2021  DOB: 10-18-1940 ? ?LN:[id number], Maritza, PA-C  Volanda Napoleon, MD  ? ?REFERRING PHYSICIAN: Volanda Napoleon, MD ? ?DIAGNOSIS:  ?  ICD-10-CM   ?1. Metastatic cancer to spine [facility])  C79.51   ?  ?2. Malignant neoplasm of upper-outer quadrant of left breast in female, estrogen receptor positive ([place])  C50.412   ? Z17.0   ?  ?3. Metastasis to bone [facility])  C79.51   ?  ? ? 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 ? Cancer Staging  ?Malignant neoplasm of upper-outer quadrant of left breast in female, estrogen receptor positive ([place]) ?Staging form: Breast, AJCC 7th Edition ?- Clinical: Stage IIA (T2, N0, cM0) - Unsigned ?- Pathologic stage from 06/28/2014: Stage IA (yT1c, N0, cM0) - Signed by Enid Cutter, MD on 07/12/2014 ?- Pathologic stage from 07/04/2021: Stage IV (rT1b, N0, M1) - Signed by Volanda Napoleon, MD on 07/04/2021 ? ?  ?CHIEF COMPLAINT: Here to discuss management of metastatic left breast cancer  ? ?HISTORY OF PRESENT ILLNESS::Patricia [name] is a 81 y.o. female who presents today for consideration of radiation therapy in management of her recently diagnosed osseous metastasis from breast cancer primary. 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 This past February, the patient presented to Dr. Rhona Raider ([place]) with complaints of lower back discomfort. This prompted an MRI of the lumbar spine on 05/26/21 which revealed a malignant appearing tumor infiltrating the L5 vertebral body, with epidural and extraosseous extension. MRI also showed severe right and left foraminal stenosis, mid malignant spinal stenosis, and a small indeterminate but suspicious 10 mm bore lesion in the anterior right sacral ala.  ? ?She reports that this  pain makes it difficult for her to play golf as ambulation is associated with pain.  The pain is at the upper part of her underwear line corresponding to L5.  She denies any bowel or bladder symptoms or numbness or weakness in her legs.  She denies any other pain in the torso spine or rib cage ? ?PET performed on 06/13/21 redemonstrated a lytic destructive bony lesion involving L5 worrisome for metastatic disease, and a small hypermetabolic left ninth posterior rib abnormality. No other findings were seen suggestive of locally recurrent left breast cancer, or metastatic disease involving the chest or abdomen/pelvis. (Chronic lung disease and a persistent small left effusion were also noted).  ? ?Biopsy of L5 on 06/30/21 revealed findings consistent with metastatic carcinoma from breast cancer primary; likely adenocarcinoma. Prognostic indicators significant for: estrogen receptor negative; progesterone receptor negative; Her2 status negative. No lymph nodes were examined.  ? ?Accordingly, the patient followed up with Dr. Marin Olp on 07/04/21 to discuss these findings. During this visit, the patient reported feeling well and denied any symptoms including back pain. In terms of treatment options, Dr. Marin Olp would like to avoid systemic therapy and recommends radiation therapy to the L5 lesion along with Xgeva every 3 months. (If she develops metastatic disease elsewhere, Dr. Marin Olp may consider systemic therapy if need be).  ? ?Of note: the patient had a bilateral screening mammogram performed on 06/26/21 which showed a possible abnormality in the right breast.  Diagnostic right breast mammogram on 07/08/21 revealed a benign asymmetry in the superior right breast.  No evidence of axillary  adenopathy was appreciated.   ? ?MRI of her brain without contrast was conducted at the neurosurgical office in March which showed no acute findings.  The patient has a history of declining memory. ? ?I personally reviewed her  imaging with her and her husband. ? ?PREVIOUS RADIATION THERAPY: Yes, under Dr. Pablo Ledger in 2016 for left breast cancer (s/p lumpectomy, XRT, and antiestrogens) ? ?Indication for treatment:  Curative    ?Radiation treatment dates: 10/22/2014-11/23/2014 ?Site/dose:   ?Left breast/ 42.72 Gy at 2.67 Gy per fraction x 16 fractions.  ?Left breast boost/ 12 Gy at 2 Gy per fraction x 6 fractions ?Beams/energy:  ?Opposed tangents with 3D breath hold with 6/10 MV photons ?Enface/ 15 MeV ? ?PAST MEDICAL HISTORY:  has a past medical history of Allergic rhinitis, cause unspecified, Aortic regurgitation (06/17/2015), Arthritis, Cancer ([place]), Chronic atrial fibrillation ([place]), Chronic cough, Chronic diastolic heart failure ([place]) (06/17/2015), CKD (chronic kidney disease), stage III ([place]), Cough variant asthma, Disease of pharynx or nasopharynx, Diverticulosis, Enlargement of right atrium (01/23/2020), Essential hypertension, GERD (gastroesophageal reflux disease), Glaucoma, Hiatal hernia, Hyperlipidemia, Internal hemorrhoids, Laryngospasm, Mitral regurgitation, Multinodular goiter, Osteopenia, Postmenopausal, Pulmonary HTN ([place]), Radiation (10/22/14-11/23/14), and Stricture and stenosis of cervix.   ? ?PAST SURGICAL HISTORY: ?Past Surgical History:  ?Procedure Laterality Date  ? BREAST LUMPECTOMY Left   ? BREAST SURGERY    ? CARDIOVERSION N/A 06/24/2015  ? Procedure: CARDIOVERSION;  Surgeon: Sueanne Margarita, MD;  Location: [facility];  Service: Cardiovascular;  Laterality: N/A;  ? CARDIOVERSION N/A 05/19/2016  ? Procedure: CARDIOVERSION;  Surgeon: Sueanne Margarita, MD;  Location: [facility] ENDOSCOPY;  Service: Cardiovascular;  Laterality: N/A;  ? COLONOSCOPY    ? LUNG CANCER SURGERY  2003  ? resection carcinoid lingula-lt upper lobe  ? RADIOACTIVE SEED GUIDED PARTIAL MASTECTOMY WITH AXILLARY SENTINEL LYMPH NODE BIOPSY Left 06/26/2014  ? Procedure: RADIOACTIVE SEED GUIDED PARTIAL MASTECTOMY WITH AXILLARY SENTINEL LYMPH NODE BIOPSY;  Surgeon:  Stark Klein, MD;  Location: [facility];  Service: General;  Laterality: Left;  ? RE-EXCISION OF BREAST LUMPECTOMY Left 08/07/2014  ? Procedure: RE-EXCISION OF LEFT BREAST LUMPECTOMY;  Surgeon: Stark Klein, MD;  Location: [facility];  Service: General;  Laterality: Left;  ? RIGHT HEART CATH N/A 04/27/2016  ? Procedure: Right Heart Cath;  Surgeon: Larey Dresser, MD;  Location: [place] CV LAB;  Service: Cardiovascular;  Laterality: N/A;  ? TEE WITHOUT CARDIOVERSION N/A 01/07/2021  ? Procedure: TRANSESOPHAGEAL ECHOCARDIOGRAM (TEE);  Surgeon: Acie Fredrickson Wonda Cheng, MD;  Location: [facility] ENDOSCOPY;  Service: Cardiovascular;  Laterality: N/A;  ? ? ?FAMILY HISTORY: family history includes Atrial fibrillation in her son; Hyperlipidemia in her father and mother; Hypertension in her father and mother; Stroke in her father and mother; Transient ischemic attack in her father. ? ?SOCIAL HISTORY:  reports that she quit smoking about 55 years ago. Her smoking use included cigarettes. She has a 3.75 pack-year smoking history. She has been exposed to tobacco smoke. She has never used smokeless tobacco. She reports that she does not currently use alcohol. She reports that she does not use drugs. ? ?ALLERGIES: Combigan [brimonidine tartrate-timolol], Other, Sulfa antibiotics, and Sulfamethoxazole-trimethoprim ? ?MEDICATIONS:  ?Current Outpatient Medications  ?Medication Sig Dispense Refill  ? ADVAIR HFA 230-21 MCG/ACT inhaler USE 2 INHALATIONS TWICE A DAY 36 g 3  ? albuterol (PROVENTIL HFA;VENTOLIN HFA) 108 (90 Base) MCG/ACT inhaler Inhale 2 puffs into the lungs every 6 (six) hours as needed for wheezing or shortness of breath. (Patient not  taking: Reported on 07/04/2021)    ? apixaban (ELIQUIS) 5 MG TABS tablet TAKE 1 TABLET TWICE A DAY 180 tablet 1  ? Apoaequorin (PREVAGEN) 10 MG CAPS Take 1 capsule by mouth every morning. 90 capsule 3  ? ARMOUR THYROID 15 MG tablet TAKE 1 TABLET DAILY ALONG WITH 60 MG 90  tablet 1  ? ARMOUR THYROID 60 MG tablet TAKE 1 TABLET DAILY BEFORE BREAKFAST 90 tablet 1  ? atorvastatin (LIPITOR) 40 MG tablet TAKE 1 TABLET DAILY (Patient taking differently: Take 40 mg by mouth daily before s

## 2021-07-14 ENCOUNTER — Ambulatory Visit
Admission: RE | Admit: 2021-07-14 | Discharge: 2021-07-14 | Disposition: A | Discharge status: Home / Self Care | Payer: Medicare Other | Source: Ambulatory Visit | Attending: Radiation Oncology | Admitting: Radiation Oncology

## 2021-07-14 ENCOUNTER — Other Ambulatory Visit: Payer: Self-pay

## 2021-07-14 ENCOUNTER — Other Ambulatory Visit: Payer: Self-pay | Admitting: Radiation Therapy

## 2021-07-14 ENCOUNTER — Encounter: Payer: Self-pay | Admitting: Radiation Oncology

## 2021-07-14 VITALS — BP 125/88 | HR 92 | Temp 96.3°F | Resp 22 | Ht 60.0 in | Wt 152.5 lb

## 2021-07-14 DIAGNOSIS — I130 Hypertensive heart and chronic kidney disease with heart failure and stage 1 through stage 4 chronic kidney disease, or unspecified chronic kidney disease: Secondary | ICD-10-CM | POA: Diagnosis not present

## 2021-07-14 DIAGNOSIS — K219 Gastro-esophageal reflux disease without esophagitis: Secondary | ICD-10-CM | POA: Insufficient documentation

## 2021-07-14 DIAGNOSIS — Z7901 Long term (current) use of anticoagulants: Secondary | ICD-10-CM | POA: Diagnosis not present

## 2021-07-14 DIAGNOSIS — C7951 Secondary malignant neoplasm of bone: Secondary | ICD-10-CM | POA: Insufficient documentation

## 2021-07-14 DIAGNOSIS — C50412 Malignant neoplasm of upper-outer quadrant of left female breast: Secondary | ICD-10-CM | POA: Insufficient documentation

## 2021-07-14 DIAGNOSIS — H409 Unspecified glaucoma: Secondary | ICD-10-CM | POA: Diagnosis not present

## 2021-07-14 DIAGNOSIS — I509 Heart failure, unspecified: Secondary | ICD-10-CM | POA: Insufficient documentation

## 2021-07-14 DIAGNOSIS — E785 Hyperlipidemia, unspecified: Secondary | ICD-10-CM | POA: Insufficient documentation

## 2021-07-14 DIAGNOSIS — Z923 Personal history of irradiation: Secondary | ICD-10-CM | POA: Insufficient documentation

## 2021-07-14 DIAGNOSIS — N183 Chronic kidney disease, stage 3 unspecified: Secondary | ICD-10-CM | POA: Insufficient documentation

## 2021-07-14 DIAGNOSIS — Z8719 Personal history of other diseases of the digestive system: Secondary | ICD-10-CM | POA: Diagnosis not present

## 2021-07-14 DIAGNOSIS — Z79899 Other long term (current) drug therapy: Secondary | ICD-10-CM | POA: Insufficient documentation

## 2021-07-14 DIAGNOSIS — Z170 Estrogen receptor positive status [ER+]: Secondary | ICD-10-CM | POA: Diagnosis not present

## 2021-07-14 DIAGNOSIS — Z87891 Personal history of nicotine dependence: Secondary | ICD-10-CM | POA: Insufficient documentation

## 2021-07-14 DIAGNOSIS — M129 Arthropathy, unspecified: Secondary | ICD-10-CM | POA: Diagnosis not present

## 2021-07-14 DIAGNOSIS — I5032 Chronic diastolic (congestive) heart failure: Secondary | ICD-10-CM | POA: Insufficient documentation

## 2021-07-14 DIAGNOSIS — I482 Chronic atrial fibrillation, unspecified: Secondary | ICD-10-CM | POA: Diagnosis not present

## 2021-07-14 DIAGNOSIS — I480 Paroxysmal atrial fibrillation: Secondary | ICD-10-CM | POA: Diagnosis not present

## 2021-07-14 DIAGNOSIS — M8580 Other specified disorders of bone density and structure, unspecified site: Secondary | ICD-10-CM | POA: Diagnosis not present

## 2021-07-14 DIAGNOSIS — I272 Pulmonary hypertension, unspecified: Secondary | ICD-10-CM | POA: Diagnosis not present

## 2021-07-15 ENCOUNTER — Other Ambulatory Visit (HOSPITAL_COMMUNITY): Payer: Medicare Other

## 2021-07-15 DIAGNOSIS — H04123 Dry eye syndrome of bilateral lacrimal glands: Secondary | ICD-10-CM | POA: Diagnosis not present

## 2021-07-15 DIAGNOSIS — H52203 Unspecified astigmatism, bilateral: Secondary | ICD-10-CM | POA: Diagnosis not present

## 2021-07-15 DIAGNOSIS — H0100A Unspecified blepharitis right eye, upper and lower eyelids: Secondary | ICD-10-CM | POA: Diagnosis not present

## 2021-07-15 DIAGNOSIS — H401133 Primary open-angle glaucoma, bilateral, severe stage: Secondary | ICD-10-CM | POA: Diagnosis not present

## 2021-07-17 ENCOUNTER — Other Ambulatory Visit: Payer: Self-pay | Admitting: Cardiology

## 2021-07-17 DIAGNOSIS — I4819 Other persistent atrial fibrillation: Secondary | ICD-10-CM

## 2021-07-17 NOTE — Telephone Encounter (Signed)
Prescription refill request for Eliquis received. Indication: Afib  Last office visit: 06/24/21 ([place])  Scr: 1.22 (07/04/21)  Age: 81 Weight: 69.2kg  Appropriate dose and refill sent to requested pharmacy.

## 2021-07-21 ENCOUNTER — Inpatient Hospital Stay: Payer: Medicare Other

## 2021-07-22 ENCOUNTER — Ambulatory Visit (HOSPITAL_COMMUNITY): Payer: Medicare Other | Attending: Cardiology

## 2021-07-22 DIAGNOSIS — I5032 Chronic diastolic (congestive) heart failure: Secondary | ICD-10-CM | POA: Diagnosis not present

## 2021-07-22 DIAGNOSIS — I340 Nonrheumatic mitral (valve) insufficiency: Secondary | ICD-10-CM | POA: Diagnosis not present

## 2021-07-22 LAB — ECHOCARDIOGRAM COMPLETE
P 1/2 time: 626 ms
S' Lateral: 3.1 cm

## 2021-07-23 ENCOUNTER — Ambulatory Visit
Admission: RE | Admit: 2021-07-23 | Discharge: 2021-07-23 | Disposition: A | Discharge status: Home / Self Care | Payer: Medicare Other | Source: Ambulatory Visit | Attending: Radiation Oncology | Admitting: Radiation Oncology

## 2021-07-23 ENCOUNTER — Telehealth: Payer: Self-pay

## 2021-07-23 DIAGNOSIS — M4699 Unspecified inflammatory spondylopathy, multiple sites in spine: Secondary | ICD-10-CM | POA: Diagnosis not present

## 2021-07-23 DIAGNOSIS — D492 Neoplasm of unspecified behavior of bone, soft tissue, and skin: Secondary | ICD-10-CM | POA: Diagnosis not present

## 2021-07-23 DIAGNOSIS — I5032 Chronic diastolic (congestive) heart failure: Secondary | ICD-10-CM

## 2021-07-23 DIAGNOSIS — M4807 Spinal stenosis, lumbosacral region: Secondary | ICD-10-CM | POA: Diagnosis not present

## 2021-07-23 DIAGNOSIS — I340 Nonrheumatic mitral (valve) insufficiency: Secondary | ICD-10-CM

## 2021-07-23 DIAGNOSIS — Z01810 Encounter for preprocedural cardiovascular examination: Secondary | ICD-10-CM

## 2021-07-23 DIAGNOSIS — I351 Nonrheumatic aortic (valve) insufficiency: Secondary | ICD-10-CM

## 2021-07-23 DIAGNOSIS — C7951 Secondary malignant neoplasm of bone: Secondary | ICD-10-CM

## 2021-07-23 DIAGNOSIS — C50919 Malignant neoplasm of unspecified site of unspecified female breast: Secondary | ICD-10-CM | POA: Diagnosis not present

## 2021-07-23 MED ORDER — GADOBENATE DIMEGLUMINE 529 MG/ML IV SOLN
14.0000 mL | Freq: Once | INTRAVENOUS | Status: AC | PRN
  Administered 2021-07-23: 14 mL via INTRAVENOUS

## 2021-07-23 NOTE — Telephone Encounter (Signed)
-----   Message from Freada Bergeron, MD sent at 07/23/2021  7:01 AM EDT ----- Her echo shows normal pumping function and moderate leakiness of her mitral valve, mild leakiness of her aortic valve, and moderate-to-severe leakiness of her tricuspid valve. This is overall stable to slightly improved from her prior TTE. Will continue monitoring with repeat TTE in 6 months

## 2021-07-23 NOTE — Telephone Encounter (Signed)
The patient's husband has been notified of the result and verbalized understanding.  All questions (if any) were answered. Antonieta Iba, RN 07/23/2021 9:01 AM  Repeat echocardiogram has been ordered to be done in 6 months.

## 2021-07-25 ENCOUNTER — Ambulatory Visit
Admission: RE | Admit: 2021-07-25 | Discharge: 2021-07-25 | Disposition: A | Discharge status: Home / Self Care | Payer: Medicare Other | Source: Ambulatory Visit | Attending: Radiation Oncology | Admitting: Radiation Oncology

## 2021-07-25 ENCOUNTER — Other Ambulatory Visit: Payer: Self-pay

## 2021-07-25 DIAGNOSIS — Z510 Encounter for antineoplastic radiation therapy: Secondary | ICD-10-CM | POA: Diagnosis not present

## 2021-07-25 DIAGNOSIS — C7951 Secondary malignant neoplasm of bone: Secondary | ICD-10-CM | POA: Insufficient documentation

## 2021-07-25 DIAGNOSIS — Z170 Estrogen receptor positive status [ER+]: Secondary | ICD-10-CM | POA: Insufficient documentation

## 2021-07-25 DIAGNOSIS — C50919 Malignant neoplasm of unspecified site of unspecified female breast: Secondary | ICD-10-CM | POA: Diagnosis not present

## 2021-07-25 DIAGNOSIS — C50412 Malignant neoplasm of upper-outer quadrant of left female breast: Secondary | ICD-10-CM | POA: Insufficient documentation

## 2021-07-25 DIAGNOSIS — C799 Secondary malignant neoplasm of unspecified site: Secondary | ICD-10-CM | POA: Diagnosis not present

## 2021-07-30 ENCOUNTER — Other Ambulatory Visit: Payer: Self-pay

## 2021-07-30 ENCOUNTER — Ambulatory Visit
Admission: RE | Admit: 2021-07-30 | Discharge: 2021-07-30 | Disposition: A | Discharge status: Home / Self Care | Payer: Medicare Other | Source: Ambulatory Visit | Attending: Radiation Oncology | Admitting: Radiation Oncology

## 2021-07-30 ENCOUNTER — Other Ambulatory Visit: Payer: Self-pay | Admitting: Radiation Oncology

## 2021-07-30 DIAGNOSIS — Z170 Estrogen receptor positive status [ER+]: Secondary | ICD-10-CM | POA: Diagnosis not present

## 2021-07-30 DIAGNOSIS — C50412 Malignant neoplasm of upper-outer quadrant of left female breast: Secondary | ICD-10-CM | POA: Diagnosis not present

## 2021-07-30 DIAGNOSIS — C7951 Secondary malignant neoplasm of bone: Secondary | ICD-10-CM

## 2021-07-30 DIAGNOSIS — Z510 Encounter for antineoplastic radiation therapy: Secondary | ICD-10-CM | POA: Diagnosis not present

## 2021-07-30 LAB — RAD ONC ARIA SESSION SUMMARY
Course Elapsed Days: 0
Plan Fractions Treated to Date: 1
Plan Prescribed Dose Per Fraction: 8 Gy
Plan Total Fractions Prescribed: 5
Plan Total Prescribed Dose: 40 Gy
Reference Point Dosage Given to Date: 8 Gy
Reference Point Session Dosage Given: 8 Gy
Session Number: 1

## 2021-07-30 MED ORDER — METHYLPREDNISOLONE 4 MG PO TBPK: ORAL_TABLET | ORAL | 0 refills | Status: DC

## 2021-08-01 ENCOUNTER — Other Ambulatory Visit: Payer: Self-pay

## 2021-08-01 ENCOUNTER — Ambulatory Visit
Admission: RE | Admit: 2021-08-01 | Discharge: 2021-08-01 | Disposition: A | Discharge status: Home / Self Care | Payer: Medicare Other | Source: Ambulatory Visit | Attending: Radiation Oncology | Admitting: Radiation Oncology

## 2021-08-01 DIAGNOSIS — C50412 Malignant neoplasm of upper-outer quadrant of left female breast: Secondary | ICD-10-CM | POA: Insufficient documentation

## 2021-08-01 DIAGNOSIS — C7951 Secondary malignant neoplasm of bone: Secondary | ICD-10-CM | POA: Insufficient documentation

## 2021-08-01 DIAGNOSIS — Z510 Encounter for antineoplastic radiation therapy: Secondary | ICD-10-CM | POA: Insufficient documentation

## 2021-08-01 DIAGNOSIS — Z170 Estrogen receptor positive status [ER+]: Secondary | ICD-10-CM | POA: Diagnosis not present

## 2021-08-01 LAB — RAD ONC ARIA SESSION SUMMARY
Course Elapsed Days: 2
Plan Fractions Treated to Date: 1
Plan Prescribed Dose Per Fraction: 7 Gy
Plan Total Fractions Prescribed: 5
Plan Total Prescribed Dose: 35 Gy
Reference Point Dosage Given to Date: 7 Gy
Reference Point Session Dosage Given: 7 Gy
Session Number: 2

## 2021-08-01 NOTE — Op Note (Signed)
Name: Jaci Desanto    MRN: [id number]   Date: 08/01/2021    DOB: 28-Dec-1940   STEREOTACTIC BODY RADIOTHERAPY OPERATIVE NOTE  PRE-OPERATIVE DIAGNOSIS:  L5 vertebral metastasis  POST-OPERATIVE DIAGNOSIS:  L5 vertebral metastasis  PROCEDURE:  Stereotactic Body Radiotherapy using TrueBeam Linac device (5 fractions)  SURGEON:  Duffy Rhody, MD  RADIATION ONCOLOGIST: Eppie Gibson, MD  TECHNIQUE:  The patient underwent a radiation treatment planning session in the radiation oncology simulation suite under the care of the radiation oncology physician and physicist.  I participated closely in the radiation treatment planning afterwards. The patient underwent planning CT which was fused to lumbar spine MRI.  These images were fused on the planning system.  We contoured the gross target volumes and subsequently expanded this to yield the Planning Target Volume. A smaller PTV including the imaged tumor was planned, and a broader PTV encompassing the posterior elements and spanning to adjacent endplates was planned.  I actively participated in the planning process.  I helped to define and review the target contours and also the contours of the optic pathway, eyes, brainstem and selected nearby organs at risk.  All the dose constraints for critical structures were reviewed and compared to AAPM Task Group 101.  The prescription dose conformity was reviewed.  I approved the plan electronically.    Accordingly, [name]  was brought to the TrueBeam stereotactic radiation treatment linac.  The patient was aligned using x-rays with the 6DOF robotic table and the shifts were made to align the patient  [name] received her first fraction of stereotactic radiosurgery to a prescription dose of 35 Gy to the smaller PTV and 25 Gy to the larger PTV, with 4 more fractions planned.  The detailed description of the procedure is recorded in the radiation oncology procedure note.  I was present for the duration of the  procedure.  DISPOSITION:   Following delivery, the patient was transported to nursing in stable condition and monitored for possible acute effects to be discharged to home in stable condition with follow-up in one month.  Duffy Rhody, MD [facility]

## 2021-08-01 NOTE — Progress Notes (Signed)
Mrs. Maina rested with [name] for 15 minutes following SRS treatment.  Patient denies headache, dizziness, nausea, diplopia or ringing in the ears. Denies fatigue. Patient without complaints. Understands to avoid strenuous activity for the next 24 hours and call [phone] with needs. Patient ambulated out of clinic with husband unassisted without incident   Vitals:   08/01/21 1644  BP: (!) 130/91  Pulse: (!) 112  Resp: (!) 24  Temp: 98.4 F (36.9 C)  SpO2: 98%

## 2021-08-04 ENCOUNTER — Other Ambulatory Visit: Payer: Self-pay

## 2021-08-04 ENCOUNTER — Ambulatory Visit
Admission: RE | Admit: 2021-08-04 | Discharge: 2021-08-04 | Disposition: A | Discharge status: Home / Self Care | Payer: Medicare Other | Source: Ambulatory Visit | Attending: Radiation Oncology | Admitting: Radiation Oncology

## 2021-08-04 DIAGNOSIS — Z170 Estrogen receptor positive status [ER+]: Secondary | ICD-10-CM | POA: Diagnosis not present

## 2021-08-04 DIAGNOSIS — C7951 Secondary malignant neoplasm of bone: Secondary | ICD-10-CM | POA: Diagnosis not present

## 2021-08-04 DIAGNOSIS — Z510 Encounter for antineoplastic radiation therapy: Secondary | ICD-10-CM | POA: Diagnosis not present

## 2021-08-04 DIAGNOSIS — C50412 Malignant neoplasm of upper-outer quadrant of left female breast: Secondary | ICD-10-CM | POA: Diagnosis not present

## 2021-08-04 LAB — RAD ONC ARIA SESSION SUMMARY
Course Elapsed Days: 5
Plan Fractions Treated to Date: 2
Plan Prescribed Dose Per Fraction: 7 Gy
Plan Total Fractions Prescribed: 5
Plan Total Prescribed Dose: 35 Gy
Reference Point Dosage Given to Date: 14 Gy
Reference Point Session Dosage Given: 7 Gy
Session Number: 3

## 2021-08-04 NOTE — Progress Notes (Signed)
Mrs. Marcon rested with [name] for 15 minutes following SRS treatment.  Patient denies headache, dizziness, nausea, diplopia or ringing in the ears. Denies fatigue. Patient without complaints. Understands to avoid strenuous activity for the next 24 hours and call [phone] with needs. Patient ambulated out of clinic with husband unassisted without incident   Vitals:   08/04/21 1628  BP: (!) 147/97  Pulse: (!) 111  Resp: 20  Temp: 97.8 F (36.6 C)  SpO2: 97%

## 2021-08-05 ENCOUNTER — Ambulatory Visit
Admission: RE | Admit: 2021-08-05 | Discharge: 2021-08-05 | Disposition: A | Discharge status: Home / Self Care | Payer: Medicare Other | Source: Ambulatory Visit | Attending: Radiation Oncology | Admitting: Radiation Oncology

## 2021-08-05 ENCOUNTER — Other Ambulatory Visit: Payer: Self-pay

## 2021-08-05 DIAGNOSIS — C50412 Malignant neoplasm of upper-outer quadrant of left female breast: Secondary | ICD-10-CM | POA: Diagnosis not present

## 2021-08-05 DIAGNOSIS — Z510 Encounter for antineoplastic radiation therapy: Secondary | ICD-10-CM | POA: Diagnosis not present

## 2021-08-05 DIAGNOSIS — Z170 Estrogen receptor positive status [ER+]: Secondary | ICD-10-CM | POA: Diagnosis not present

## 2021-08-05 DIAGNOSIS — C7951 Secondary malignant neoplasm of bone: Secondary | ICD-10-CM | POA: Diagnosis not present

## 2021-08-05 LAB — RAD ONC ARIA SESSION SUMMARY
Course Elapsed Days: 6
Plan Fractions Treated to Date: 3
Plan Prescribed Dose Per Fraction: 7 Gy
Plan Total Fractions Prescribed: 5
Plan Total Prescribed Dose: 35 Gy
Reference Point Dosage Given to Date: 21 Gy
Reference Point Session Dosage Given: 7 Gy
Session Number: 4

## 2021-08-05 NOTE — Progress Notes (Signed)
Patricia [name] rested with [name] for 15 minutes following SRS treatment.  Patient denies headache, dizziness, nausea, diplopia or ringing in the ears. Denies fatigue. Patient without complaints.  Patient ambulated out of clinic with husband unassisted without incident.   142/91 BP 96 pulse 20 respirations 97.9 temp 97 % O2 sat

## 2021-08-06 ENCOUNTER — Ambulatory Visit
Admission: RE | Admit: 2021-08-06 | Discharge: 2021-08-06 | Disposition: A | Discharge status: Home / Self Care | Payer: Medicare Other | Source: Ambulatory Visit | Attending: Radiation Oncology | Admitting: Radiation Oncology

## 2021-08-06 ENCOUNTER — Other Ambulatory Visit: Payer: Self-pay | Admitting: Cardiology

## 2021-08-06 ENCOUNTER — Other Ambulatory Visit: Payer: Self-pay

## 2021-08-06 DIAGNOSIS — C50412 Malignant neoplasm of upper-outer quadrant of left female breast: Secondary | ICD-10-CM | POA: Diagnosis not present

## 2021-08-06 DIAGNOSIS — C7951 Secondary malignant neoplasm of bone: Secondary | ICD-10-CM | POA: Diagnosis not present

## 2021-08-06 DIAGNOSIS — Z170 Estrogen receptor positive status [ER+]: Secondary | ICD-10-CM | POA: Diagnosis not present

## 2021-08-06 DIAGNOSIS — Z510 Encounter for antineoplastic radiation therapy: Secondary | ICD-10-CM | POA: Diagnosis not present

## 2021-08-06 LAB — RAD ONC ARIA SESSION SUMMARY
Course Elapsed Days: 7
Plan Fractions Treated to Date: 4
Plan Fractions Treated to Date: 4
Plan Prescribed Dose Per Fraction: 7 Gy
Plan Prescribed Dose Per Fraction: 8 Gy
Plan Total Fractions Prescribed: 5
Plan Total Fractions Prescribed: 5
Plan Total Prescribed Dose: 35 Gy
Plan Total Prescribed Dose: 40 Gy
Reference Point Dosage Given to Date: 28 Gy
Reference Point Dosage Given to Date: 32 Gy
Reference Point Session Dosage Given: 7 Gy
Reference Point Session Dosage Given: 8 Gy
Session Number: 5

## 2021-08-06 NOTE — Progress Notes (Signed)
Mrs. Rullo rested with [name] for 15 minutes following her SRS treatment to L5 and left 9th rib.  Denies pain, bowel or bladder changes.  She is encouraged to avoid strenuous activity for the next 24 hours.  BP (!) 118/93 (BP Location: Left Arm)   Pulse (!) 110   Temp 97.8 F (36.6 C)   Resp 18   SpO2 97%    Ashlin Kreps M. Leonie Green, BSN

## 2021-08-06 NOTE — Progress Notes (Signed)
[facility] [address] [place]. [place] [address] [phone]       OFFICE FOLLOW-UP NOTE  [name] Date of Birth:  10-Jun-1940 Medical Record Number:  [id number]   HPI: Initial visit 03/27/2019 Ms. [name] is a 81 year old pleasant Caucasian lady with past medical history of left breast cancer with radiation, persistent atrial fibrillation on Eliquis, multinodular goiter, laryngeal spasms, essential hypertension, chronic kidney disease, diastolic heart failure, and hyperlipidemia who was last seen in office on 11/19/2016.  She is seen today for follow-up following recent ER visit on November 30, 2018.  She was doing well until 2 days prior when the husband sent her to another room at home to get her checkbook.  The patient recalled trying to move a rolling chair out of her way and suddenly she fell backwards leaning on the wall and slid down to the floor.  The husband arrived a few minutes later and noted that she was unresponsive and was not speaking or answering his questions.  He called EMS and by the time they arrived patient was gradually returning back to normal.  She could not remember the episode and did not have a memory of the event.  She denied any chest pain, sweating, palpitations, headache, tongue bite injury or incontinence.  CT scan of the head was unremarkable and MRI scan showed no evidence of acute stroke or hemorrhage.  EEG showed bitemporal focal slowing.  Patient was on Eliquis for atrial fibrillation and prior stroke which was continued.  She states she is done well since then and has not had any further episodes of office consciousness or seizures.  The patient's husband however feels that is noticed that her memory is not the same and she has had some cognitive decline.  There have been no episodes of confusion, disorientation or agitation.  She has not had any delusions hallucinations.  She has not had any lab work to look for reversible causes of  cognitive impairment.  She denies any headache, falls or injuries.  She feels her balance may be slightly off. Update 06/22/2019 : She returns for follow-up after last visit 6 months ago. She is accompanied by her husband. She states that him memory difficulties are about unchanged. She is started taking Prevagen 10 mg 1 capsule daily and tolerating it well. She has had no further episodes of confusion or seizure-like episode and she remains on Keppra XR 500 mg daily which is tolerating well without side effects. She has had no recurrent stroke or TIAs either. She remains on Eliquis and is tolerating it well with minor bruising but no bleeding. Blood pressure is well controlled though it is elevated today in office. She has been participating in mentally challenging activities and she does have family history of Alzheimer's and worries about it and wants me to try something else to help . Update 01/04/2020: She returns for follow-up after last visit 6 months ago.  She is accompanied by her husband.  She states she is doing well.  Subjective memory difficulties appear about unchanged.  She remains on provision as well as she started taking Cerefolin NAC and is tolerating it well.  She has had no episodes of seizure confusion.  She is tolerating Keppra XR 500 mg without any side effects.  She has had no recurrent stroke or TIAs either.  She remains on Eliquis which is tolerating well without bruising or bleeding.  Her blood pressures well controlled and usually in the 154 systolic range  and today it is 117/74 in office.  She has no new complaints. Update 07/15/2020 : She returns for follow-up after last visit 6 months ago.  She is accompanied by her husband.  Patient states she is doing well and the memory difficulties are unchanged however the husband feels that the short-term memory is getting worse.  However she remains quite independent in activities of daily living.  She just does not remember to write things  down.  She still ending most of her own affairs.  She remains on Prevagen as well as Cerefolin NAC.  She plays bridge regularly but is not doing any other cognitively challenging activities.  She has had no recurrent stroke or TIA symptoms.  She has had no other episodes of loss of consciousness or seizures either.  She remains on Eliquis which is tolerating well with only minor bruising and no bleeding.  Blood pressure is well controlled.  Dr. Radford Pax her cardiologist increase the dose of metoprolol and blood pressure today is 104/73 and heart rate is 58.  She also remains on Keppra XR 5 mg daily which she is tolerating well without side effects.  She did have follow-up carotid ultrasound done on 01/31/2020 which showed no significant extracranial stenosis.  She has no new complaints. Update 09/12/2020 : She returns for follow-up after last visit with me 2 months ago.  This appointment was made when the husband called requesting she be seen sooner.  She had an episode a week ago when she was to drive to her dentist and her husband program the address and the GPS and the GPS took her through a different route and she appeared confused and lost and missed her appointment.  She came home an hour later and.  Slightly disoriented and frustrated but quickly returned back to baseline.  She denied any headache, tongue bite, seizure-like activity.  She continues to have mild short-term memory difficulties but does do crossword puzzles and mentally challenging activities.  She has had no recurrent TIA or strokelike episodes or seizure-like episodes.  She remains on Keppra which is tolerating well without side effects.  She is also on Eliquis tolerating it well without bruising or bleeding.  Update 02/04/2021 JM: Returns for sooner scheduled visit per request of husband due to concern of worsening memory.  She was seen by Dr. Leonie Man 5 months ago.  She is accompanied today by her husband.  She believes her memory has been stable  but per husband, some decline more so with short-term memory such as remembering recent conversations or events. He has been trying to encourage her to write things down in a planner but is resistant to do so.  Able to maintain ADLs and majority of IADLs independently. Husband pays bills and she only drives short distance.  She will occasionally do memory exercises.  Denies depression or anxiety.  Continues on Prevagen and Cerefolin. MMSE today 24/30 (prior 27/30). Just received CPAP machine and trying to get used to using it.  No recurrent stroke/TIA symptoms or seizure-like episodes.  Compliant on Keppra -denies side effects.  Compliant on Eliquis for history of A. fib and stroke prevention without side effects.  Blood pressure stable at 115/81 today.  No further concerns at this time.   Update 08/06/2021 JM: Patient returns for 91-month follow-up accompanied by her husband.  Per patient, cognition has been stable although husband believes it has slightly declined. MMSE today 23/30 (prior 24/30).  She has remained on Namenda, Cerefolin and Prevagen, denies  side effects.  She plays bridge weekly, does not do any other type of memory exercise. Sleeps well, currently using CPAP machine.  She currently only drives short distance (1-2 mi from home) as she got lost a couple times driving long distance.  Continues to maintain ADLs independently.  Completed neurocognitive evaluation on 1/24 with testing suggestive of frontal-subcortical dysfunction as well as some indication of possible mesial temporal lobe dysfunction likely multifactoral with contribution from several vascular risk factors as well as signs of potential early Alzheimer's pathology.  As requested by neuropsych, completed MRI which showed moderate generalized atrophy and known right superior temporal ischemic infarct.    Denies any seizure activity and remains on Keppra, denies side effects.  Stable from stroke standpoint and remains on Eliquis without  side effects.  Blood pressure today 106/73. Occasionally monitors at home which has been stable.   Patient is currently being followed by oncology with history of early-stage breast cancer with more recent diagnosis of metastatic carcinoma, currently receiving radiation therapy to L5 vertebral body which was started on 5/8 and is to be completed tomorrow.  Has follow-up with oncology next week, per husband, she may not need to proceed chemo but they plan on discussing this at follow-up visit.  No further concerns at this time.     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 Dx 04/2015 at time of stroke. b. DCCV 06/2015 - did not hold. Amio started then stopped due to QT prolongation.   Chronic cough    Chronic diastolic heart failure ([place]) 06/17/2015   CKD (chronic kidney disease), stage III (HCC)    Cough variant asthma    Disease of pharynx or nasopharynx    Diverticulosis    Enlargement of right atrium 01/23/2020   Essential hypertension    GERD (gastroesophageal reflux disease)    Glaucoma    Hiatal hernia    Hyperlipidemia    Internal hemorrhoids    Laryngospasm    Mitral regurgitation    mild to moderate by echo 12/2019 and moderate by TEE 12/2020   Multinodular goiter    Osteopenia    Postmenopausal    Pulmonary HTN (HCC)     Moderate with PASP 54mmHg by echo 2018 likely Group 2 from pulmonary venous HTN from CHF>>normal PAP by echo 12/2019   Radiation 10/22/14-11/23/14   Left Breast   Stricture and stenosis of cervix     Social History:  Social History   Socioeconomic History   Marital status: Married    Spouse name: [name]   Number of  children: Not on file   Years of education: Not on file   Highest education level: Not on file  Occupational History   Not on file  Tobacco Use   Smoking status: Former    Packs/day: 0.75    Years: 5.00    Total pack years: 3.75    Types: Cigarettes    Quit date: 06/15/1966    Years since quitting: 55.1    Passive exposure: Past   Smokeless tobacco: Never  Vaping Use  Vaping Use: Never used  Substance and Sexual Activity   Alcohol use: Not Currently    Comment: occasional wine   Drug use: No   Sexual activity: Not Currently    Birth control/protection: Post-menopausal  Other Topics Concern   Not on file  Social History Narrative   Retired. Lives with husband.    Right Handed   Drinks 1-2 cups caffeine daily   Social Determinants of Health   Financial Resource Strain: Not on file  Food Insecurity: Not on file  Transportation Needs: Not on file  Physical Activity: Not on file  Stress: Not on file  Social Connections: Not on file  Intimate Partner Violence: Not on file    Medications:   Current Outpatient Medications on File Prior to Visit  Medication Sig Dispense Refill   ADVAIR HFA 230-21 MCG/ACT inhaler USE 2 INHALATIONS TWICE A DAY 36 g 3   albuterol (PROVENTIL HFA;VENTOLIN HFA) 108 (90 Base) MCG/ACT inhaler Inhale 2 puffs into the lungs every 6 (six) hours as needed for wheezing or shortness of breath.     apixaban (ELIQUIS) 5 MG TABS tablet TAKE 1 TABLET TWICE A DAY 180 tablet 3   Apoaequorin (PREVAGEN) 10 MG CAPS Take 1 capsule by mouth every morning. 90 capsule 3   ARMOUR THYROID 15 MG tablet TAKE 1 TABLET DAILY ALONG WITH 60 MG 90 tablet 1   ARMOUR THYROID 60 MG tablet TAKE 1 TABLET DAILY BEFORE BREAKFAST 90 tablet 1   atorvastatin (LIPITOR) 40 MG tablet TAKE 1 TABLET DAILY (Patient taking differently: Take 40 mg by mouth daily before supper.) 90 tablet 3   bimatoprost (LUMIGAN) 0.01 % SOLN Place 1 drop into both eyes 2 (two) times daily.     calcium-vitamin  D (OSCAL WITH D) 500-200 MG-UNIT tablet Take 1 tablet by mouth daily with breakfast. 30 tablet 0   diltiazem (CARDIZEM CD) 300 MG 24 hr capsule TAKE 1 CAPSULE DAILY 90 capsule 2   dorzolamide-timolol (COSOPT) 22.3-6.8 MG/ML ophthalmic solution Place 1 drop into both eyes at bedtime.     fluticasone (FLONASE) 50 MCG/ACT nasal spray 1 spray each nostril following sinus rinses twice daily 16 g 2   furosemide (LASIX) 40 MG tablet Take 1 tablet (40 mg total) by mouth 2 (two) times daily. 180 tablet 3   gabapentin (NEURONTIN) 400 MG capsule TAKE 1 CAPSULE TWICE A DAY 180 capsule 3   L-Methylfolate-B12-B6-B2 (CEREFOLIN) 07-31-48-5 MG TABS TAKE 1 TABLET BY MOUTH EVERY MORNING 90 tablet 3   levETIRAcetam (KEPPRA XR) 500 MG 24 hr tablet TAKE 1 TABLET DAILY (Patient taking differently: Take 500 mg by mouth every evening.) 90 tablet 3   loratadine (CLARITIN) 10 MG tablet Take 10 mg by mouth daily.     memantine (NAMENDA) 10 MG tablet Take 1 tablet (10 mg total) by mouth 2 (two) times daily. 60 tablet 5   Multiple Vitamins-Iron (MULTIVITAMIN/IRON) TABS Take 1 tablet by mouth daily.     Netarsudil Dimesylate (RHOPRESSA) 0.02 % SOLN Place 1 drop into both eyes at bedtime.     pantoprazole (PROTONIX) 40 MG tablet TAKE 1 TABLET DAILY 90 tablet 3   potassium chloride SA (KLOR-CON M) 20 MEQ tablet TAKE 1 TABLET TWICE A DAY 180 tablet 3   traMADol (ULTRAM) 50 MG tablet Take 1 tablet (50 mg total) by mouth every 6 (six) hours as needed. 60 tablet 0   Vitamin D, Ergocalciferol, (DRISDOL) 1.25 MG (50000 UNIT) CAPS capsule Take one capsule weekly (Patient taking  differently: Take 50,000 Units by mouth every 7 (seven) days. Sunday) 12 capsule 3   Current Facility-Administered Medications on File Prior to Visit  Medication Dose Route Frequency Provider Last Rate Last Admin   denosumab (PROLIA) injection 60 mg  60 mg Subcutaneous Once Magrinat, Virgie Dad, MD        Allergies:   Allergies  Allergen Reactions   Combigan  [Brimonidine Tartrate-Timolol] Itching    Eyes itch, reddened   Other Other (See Comments)    Per patient made OU red, Sore, and sensitivity to light   Sulfa Antibiotics Rash   Sulfamethoxazole-Trimethoprim Hives    Physical Exam Today's Vitals   08/07/21 0830  BP: 106/73  Pulse: (!) 102  Weight: 150 lb (68 kg)  Height: 5' (1.524 m)    Body mass index is 29.29 kg/m.   General: Petite elderly very pleasant Caucasian lady, seated, in no evident distress Head: head normocephalic and atraumatic.  Neck: supple with no carotid or supraclavicular bruits Cardiovascular: irregular rate and rhythm, no murmurs Musculoskeletal: Mild kyphoscoliosis Skin:  no rash/petichiae Vascular:  Normal pulses all extremities  Neurologic Exam Mental Status: Awake and fully alert.  Fluent speech and language.  Oriented to place and time. Recent and remote memory diminished. Attention span, concentration and fund of knowledge slightly diminished. Mood and affect appropriate.      08/07/2021    8:35 AM 02/04/2021    8:29 AM 09/12/2020    3:06 PM  MMSE - Mini Mental State Exam  Orientation to time 0 4 3  Orientation to Place 4 4 5   Registration 3 3 3   Attention/ Calculation 5 2 5   Recall 2 2 2   Language- name 2 objects 2 2 2   Language- repeat 1 1 1   Language- follow 3 step command 3 3 3   Language- read & follow direction 1 1 1   Write a sentence 1 1 1   Copy design 1 1 1   Total score 23 24 27    Cranial Nerves: Pupils equal, briskly reactive to light. Extraocular movements full without nystagmus. Visual fields full to confrontation. HOH bilaterally. Facial sensation intact. Face, tongue, palate moves normally and symmetrically.  Motor: Normal bulk and tone. Normal strength in all tested extremity muscles. Sensory.: intact to touch ,pinprick .position and vibratory sensation.  Coordination: Rapid alternating movements normal in all extremities. Finger-to-nose and heel-to-shin performed accurately  bilaterally. Gait and Station: Arises from chair without difficulty. Stance is normal. Gait demonstrates normal stride length and balance without use of assistive device Reflexes: 1+ and symmetric. Toes downgoing.        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  Episode of brief loss of consciousness followed by confusion possibly unwitnessed seizure with postictal confusion in September 2020.  Also mild cognitive impairment with continued gradual decline of short-term memory. Episode of transient confusion and disorientation likely related to her baseline mild cognitive impairment doubt seizure in 08/2020.  Neurocognitive evaluation 03/2021 frontal-subcortical dysfunction and some indication of possible mesial temporal lobe dysfunction with possible etiology multifactorial with combination of several vascular risk factors and signs of potential early Alzheimer's pathology. Hx of breast cancer with more recent finding of metastasis to the spine with completion of radiation therapy tomorrow.    PLAN:  -Cognition continues to gradually, although slowly, decline -Encourage continued participation playing bridge but also increase participation in other type of memory exercises as well  as importance of compensation strategies -Discussed importance of nightly use of CPAP for OSA management -Continue Namenda 10 mg twice daily -refill provided -continue Prevagen 10 mg and  Cerefolin NAC 1 capsule daily  -continue Keppra XR 500 mg daily for seizure prophylaxis -refill provided -continue close PCP and cardiology follow-up for aggressive stroke risk factor management and atrial fibrillation management.  Continue Eliquis for stroke prevention with atrial fibrillation managed by cardiology    Follow-up in 6 months or call earlier if needed    CC:  Patricia [name], [place],  PA-C   I spent 32 minutes of face-to-face and non-face-to-face time with patient and husband.  This included previsit chart review, lab review, study review, order entry, electronic health record documentation, patient and husband education and discussion regarding continued cognitive impairment with review and discussion of MMSE, review of neurocognitive evaluation, history of stroke and seizure disorder as above and answered all other questions to patient and husband satisfaction  Frann Rider, AGNP-BC  [facility] [address] [place], [place] [address]  Phone [phone] Fax [phone] Note: This document was prepared with digital dictation and possible smart phrase technology. Any transcriptional errors that result from this process are unintentional.

## 2021-08-07 ENCOUNTER — Ambulatory Visit (INDEPENDENT_AMBULATORY_CARE_PROVIDER_SITE_OTHER): Payer: Medicare Other | Admitting: Adult Health

## 2021-08-07 ENCOUNTER — Encounter: Payer: Self-pay | Admitting: Adult Health

## 2021-08-07 VITALS — BP 106/73 | HR 102 | Ht 60.0 in | Wt 150.0 lb

## 2021-08-07 DIAGNOSIS — G40909 Epilepsy, unspecified, not intractable, without status epilepticus: Secondary | ICD-10-CM

## 2021-08-07 DIAGNOSIS — F01A0 Vascular dementia, mild, without behavioral disturbance, psychotic disturbance, mood disturbance, and anxiety: Secondary | ICD-10-CM | POA: Diagnosis not present

## 2021-08-07 DIAGNOSIS — G3184 Mild cognitive impairment, so stated: Secondary | ICD-10-CM | POA: Diagnosis not present

## 2021-08-07 DIAGNOSIS — Z8673 Personal history of transient ischemic attack (TIA), and cerebral infarction without residual deficits: Secondary | ICD-10-CM

## 2021-08-07 MED ORDER — MEMANTINE HCL 10 MG PO TABS: 10.0000 mg | ORAL_TABLET | Freq: Two times a day (BID) | ORAL | 3 refills | Status: DC

## 2021-08-07 MED ORDER — LEVETIRACETAM ER 500 MG PO TB24: 500.0000 mg | ORAL_TABLET | Freq: Every day | ORAL | 3 refills | Status: DC

## 2021-08-07 NOTE — Patient Instructions (Signed)
Your Plan:  No changes today - continue current treatment plan  Try to increase participation in different memory exercises as this can help with your memory loss. Also, doing compensation strategies (as listed below) can be very helpful   Follow up in 6 months or call earlier if needed     Thank you for coming to see [name] at [facility]. I hope we have been able to provide you high quality care today.  You may receive a patient satisfaction survey over the next few weeks. We would appreciate your feedback and comments so that we may continue to improve ourselves and the health of our patients.   Memory Compensation Strategies  Use "WARM" strategy.  W= write it down  A= associate it  R= repeat it  M= make a mental note  2.   You can keep a Social worker.  Use a 3-ring notebook with sections for the following: calendar, important names and phone numbers,  medications, doctors' names/phone numbers, lists/reminders, and a section to journal what you did  each day.   3.    Use a calendar to write appointments down.  4.    Write yourself a schedule for the day.  This can be placed on the calendar or in a separate section of the Memory Notebook.  Keeping a  regular schedule can help memory.  5.    Use medication organizer with sections for each day or morning/evening pills.  You may need help loading it  6.    Keep a basket, or pegboard by the door.  Place items that you need to take out with you in the basket or on the pegboard.  You may also want to  include a message board for reminders.  7.    Use sticky notes.  Place sticky notes with reminders in a place where the task is performed.  For example: " turn off the  stove" placed by the stove, "lock the door" placed on the door at eye level, " take your medications" on  the bathroom mirror or by the place where you normally take your medications.  8.    Use alarms/timers.  Use while cooking to remind yourself  to check on food or as a reminder to take your medicine, or as a  reminder to make a call, or as a reminder to perform another task, etc.

## 2021-08-08 ENCOUNTER — Encounter: Payer: Self-pay | Admitting: Radiation Oncology

## 2021-08-08 ENCOUNTER — Other Ambulatory Visit: Payer: Self-pay

## 2021-08-08 ENCOUNTER — Ambulatory Visit
Admission: RE | Admit: 2021-08-08 | Discharge: 2021-08-08 | Disposition: A | Discharge status: Home / Self Care | Payer: Medicare Other | Source: Ambulatory Visit | Attending: Radiation Oncology | Admitting: Radiation Oncology

## 2021-08-08 DIAGNOSIS — C7951 Secondary malignant neoplasm of bone: Secondary | ICD-10-CM | POA: Diagnosis not present

## 2021-08-08 DIAGNOSIS — Z510 Encounter for antineoplastic radiation therapy: Secondary | ICD-10-CM | POA: Diagnosis not present

## 2021-08-08 DIAGNOSIS — C50412 Malignant neoplasm of upper-outer quadrant of left female breast: Secondary | ICD-10-CM | POA: Diagnosis not present

## 2021-08-08 DIAGNOSIS — Z170 Estrogen receptor positive status [ER+]: Secondary | ICD-10-CM | POA: Diagnosis not present

## 2021-08-08 LAB — RAD ONC ARIA SESSION SUMMARY
Course Elapsed Days: 9
Plan Fractions Treated to Date: 5
Plan Fractions Treated to Date: 5
Plan Prescribed Dose Per Fraction: 7 Gy
Plan Prescribed Dose Per Fraction: 8 Gy
Plan Total Fractions Prescribed: 5
Plan Total Fractions Prescribed: 5
Plan Total Prescribed Dose: 35 Gy
Plan Total Prescribed Dose: 40 Gy
Reference Point Dosage Given to Date: 35 Gy
Reference Point Dosage Given to Date: 40 Gy
Reference Point Session Dosage Given: 7 Gy
Reference Point Session Dosage Given: 8 Gy
Session Number: 6

## 2021-08-08 NOTE — Progress Notes (Signed)
Patricia [name] rested with [name] for 15 minutes following her SRS treatment to L5 and left 9th rib.  Denies pain, bowel or bladder changes.  She is encouraged to avoid strenuous activity for the next 24 hours.  BP 120/86   Pulse 94   Temp (!) 97.1 F (36.2 C)   Resp 18   SpO2 98%    [name] M. Leonie Green, BSN

## 2021-08-11 ENCOUNTER — Ambulatory Visit: Payer: Medicare Other | Admitting: Radiation Oncology

## 2021-08-13 ENCOUNTER — Inpatient Hospital Stay: Payer: Medicare Other | Attending: Hematology & Oncology

## 2021-08-13 ENCOUNTER — Encounter: Payer: Self-pay | Admitting: *Deleted

## 2021-08-13 ENCOUNTER — Inpatient Hospital Stay (HOSPITAL_BASED_OUTPATIENT_CLINIC_OR_DEPARTMENT_OTHER): Payer: Medicare Other | Admitting: Hematology & Oncology

## 2021-08-13 ENCOUNTER — Encounter: Payer: Self-pay | Admitting: Hematology & Oncology

## 2021-08-13 ENCOUNTER — Other Ambulatory Visit: Payer: Self-pay | Admitting: Oncology

## 2021-08-13 VITALS — BP 114/73 | HR 96 | Temp 98.1°F | Resp 24 | Ht 60.0 in

## 2021-08-13 DIAGNOSIS — C50412 Malignant neoplasm of upper-outer quadrant of left female breast: Secondary | ICD-10-CM

## 2021-08-13 DIAGNOSIS — Z170 Estrogen receptor positive status [ER+]: Secondary | ICD-10-CM

## 2021-08-13 DIAGNOSIS — Z853 Personal history of malignant neoplasm of breast: Secondary | ICD-10-CM | POA: Diagnosis not present

## 2021-08-13 DIAGNOSIS — C7951 Secondary malignant neoplasm of bone: Secondary | ICD-10-CM | POA: Diagnosis not present

## 2021-08-13 LAB — CMP (CANCER CENTER ONLY)
ALT: 18 U/L (ref 0–44)
AST: 19 U/L (ref 15–41)
Albumin: 4 g/dL (ref 3.5–5.0)
Alkaline Phosphatase: 76 U/L (ref 38–126)
Anion gap: 7 (ref 5–15)
BUN: 18 mg/dL (ref 8–23)
CO2: 29 mmol/L (ref 22–32)
Calcium: 9.8 mg/dL (ref 8.9–10.3)
Chloride: 106 mmol/L (ref 98–111)
Creatinine: 1.34 mg/dL — ABNORMAL HIGH (ref 0.44–1.00)
GFR, Estimated: 40 mL/min — ABNORMAL LOW (ref >60)
Glucose, Bld: 123 mg/dL — ABNORMAL HIGH (ref 70–99)
Potassium: 4.9 mmol/L (ref 3.5–5.1)
Sodium: 142 mmol/L (ref 135–145)
Total Bilirubin: 0.6 mg/dL (ref 0.3–1.2)
Total Protein: 6.2 g/dL — ABNORMAL LOW (ref 6.5–8.1)

## 2021-08-13 LAB — CBC WITH DIFFERENTIAL (CANCER CENTER ONLY)
Abs Immature Granulocytes: 0.04 10*3/uL (ref 0.00–0.07)
Basophils Absolute: 0 10*3/uL (ref 0.0–0.1)
Basophils Relative: 0 %
Eosinophils Absolute: 0 10*3/uL (ref 0.0–0.5)
Eosinophils Relative: 0 %
HCT: 45.4 % (ref 36.0–46.0)
Hemoglobin: 15 g/dL (ref 12.0–15.0)
Immature Granulocytes: 1 %
Lymphocytes Relative: 10 %
Lymphs Abs: 0.5 10*3/uL — ABNORMAL LOW (ref 0.7–4.0)
MCH: 32.5 pg (ref 26.0–34.0)
MCHC: 33 g/dL (ref 30.0–36.0)
MCV: 98.5 fL (ref 80.0–100.0)
Monocytes Absolute: 0.7 10*3/uL (ref 0.1–1.0)
Monocytes Relative: 12 %
Neutro Abs: 4.2 10*3/uL (ref 1.7–7.7)
Neutrophils Relative %: 77 %
Platelet Count: 140 10*3/uL — ABNORMAL LOW (ref 150–400)
RBC: 4.61 MIL/uL (ref 3.87–5.11)
RDW: 13.2 % (ref 11.5–15.5)
WBC Count: 5.4 10*3/uL (ref 4.0–10.5)
nRBC: 0 % (ref 0.0–0.2)

## 2021-08-13 NOTE — Progress Notes (Signed)
Hematology and Oncology Follow Up Visit  Patricia [name] [id number] 1940-04-12 81 y.o. 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  She looks quite good.  She completed the radiation therapy to her ninth left rib and the L5 vertebral body.  This was about a week or so ago.  She is feeling okay.  She had no problems with the radiation.  Sounds like this may have been stereotactic radiosurgery.  She does have some issues with her memory.  She does see neurology for some dementia.  She is on medication for this.  She has had no problems with pain.  There is been no cough or shortness of breath.  She has had no rashes.  There is been no leg swelling.  She has had no headache.  Of note, when we first saw her, her CA 27.29 was 29.3.  Surprising, she did have an elevated CEA of 15.  We may have to follow this along also.  She has had no change in bowel or bladder habits.  She has had no cough.  She has had no fever.  There is been no leg swelling.  Overall, I would have said that her performance status for now is ECOG 1.    Medications:  Current Outpatient Medications:    ADVAIR HFA 230-21 MCG/ACT inhaler, USE 2 INHALATIONS TWICE A DAY, Disp: 36 g, Rfl: 3   albuterol (PROVENTIL HFA;VENTOLIN HFA) 108 (90 Base) MCG/ACT inhaler, Inhale 2 puffs into the lungs every 6 (six) hours as needed for wheezing or shortness of breath., Disp: , Rfl:    apixaban (ELIQUIS) 5 MG TABS tablet, TAKE 1 TABLET TWICE A DAY, Disp: 180 tablet, Rfl: 3   Apoaequorin (PREVAGEN) 10 MG CAPS, Take 1 capsule by mouth every morning., Disp: 90 capsule, Rfl: 3   ARMOUR THYROID 15 MG tablet, TAKE 1 TABLET DAILY ALONG WITH 60 MG, Disp: 90 tablet, Rfl: 1    ARMOUR THYROID 60 MG tablet, TAKE 1 TABLET DAILY BEFORE BREAKFAST, Disp: 90 tablet, Rfl: 1   atorvastatin (LIPITOR) 40 MG tablet, TAKE 1 TABLET DAILY (Patient taking differently: Take 40 mg by mouth daily before supper.), Disp: 90 tablet, Rfl: 3   bimatoprost (LUMIGAN) 0.01 % SOLN, Place 1 drop into both eyes 2 (two) times daily., Disp: , Rfl:    calcium-vitamin D (OSCAL WITH D) 500-200 MG-UNIT tablet, Take 1 tablet by mouth daily with breakfast., Disp: 30 tablet, Rfl: 0   diltiazem (CARDIZEM CD) 300 MG 24 hr capsule, TAKE 1 CAPSULE DAILY, Disp: 90 capsule, Rfl: 2   dorzolamide-timolol (COSOPT) 22.3-6.8 MG/ML ophthalmic solution, Place 1 drop into both eyes at bedtime., Disp: , Rfl:    fluticasone (FLONASE) 50 MCG/ACT nasal spray, 1 spray each nostril following sinus rinses twice daily, Disp: 16 g, Rfl: 2   furosemide (LASIX) 40 MG tablet, Take 1 tablet (40 mg total) by mouth 2 (two) times daily., Disp: 180 tablet, Rfl: 3   gabapentin (NEURONTIN) 400 MG capsule, TAKE 1 CAPSULE TWICE A DAY, Disp: 180 capsule, Rfl: 3   L-Methylfolate-B12-B6-B2 (CEREFOLIN) 07-31-48-5 MG TABS, TAKE 1 TABLET BY MOUTH EVERY MORNING, Disp: 90 tablet, Rfl: 3   levETIRAcetam (KEPPRA XR) 500 MG 24 hr tablet, Take 1  tablet (500 mg total) by mouth at bedtime., Disp: 90 tablet, Rfl: 3   memantine (NAMENDA) 10 MG tablet, Take 1 tablet (10 mg total) by mouth 2 (two) times daily., Disp: 180 tablet, Rfl: 3   Multiple Vitamins-Iron (MULTIVITAMIN/IRON) TABS, Take 1 tablet by mouth daily., Disp: , Rfl:    Netarsudil Dimesylate (RHOPRESSA) 0.02 % SOLN, Place 1 drop into both eyes at bedtime., Disp: , Rfl:    pantoprazole (PROTONIX) 40 MG tablet, TAKE 1 TABLET DAILY, Disp: 90 tablet, Rfl: 3   potassium chloride SA (KLOR-CON M) 20 MEQ tablet, TAKE 1 TABLET TWICE A DAY, Disp: 180 tablet, Rfl: 3   Vitamin D, Ergocalciferol, (DRISDOL) 1.25 MG (50000 UNIT) CAPS capsule, Take one capsule weekly (Patient taking differently: Take 50,000 Units by  mouth every 7 (seven) days. Sunday), Disp: 12 capsule, Rfl: 3   loratadine (CLARITIN) 10 MG tablet, Take 10 mg by mouth daily. (Patient not taking: Reported on 08/13/2021), Disp: , Rfl:    traMADol (ULTRAM) 50 MG tablet, Take 1 tablet (50 mg total) by mouth every 6 (six) hours as needed. (Patient not taking: Reported on 08/13/2021), Disp: 60 tablet, Rfl: 0 No current facility-administered medications for this visit.  Facility-Administered Medications Ordered in Other Visits:    denosumab (PROLIA) injection 60 mg, 60 mg, Subcutaneous, Once, Magrinat, Virgie Dad, MD  Allergies:  Allergies  Allergen Reactions   Combigan [Brimonidine Tartrate-Timolol] Itching    Eyes itch, reddened   Other Other (See Comments)    Per patient made OU red, Sore, and sensitivity to light   Sulfa Antibiotics Rash   Sulfamethoxazole-Trimethoprim Hives    Past Medical History, Surgical history, Social history, and Family History were reviewed and updated.  Review of Systems: Review of Systems  Constitutional: Negative.   HENT:  Negative.    Eyes: Negative.   Respiratory: Negative.    Cardiovascular: Negative.   Gastrointestinal: Negative.   Endocrine: Negative.   Genitourinary: Negative.    Musculoskeletal: Negative.   Skin: Negative.   Neurological: Negative.   Hematological: Negative.   Psychiatric/Behavioral: Negative.      Physical Exam:  height is 5' (1.524 m). Her oral temperature is 98.1 F (36.7 C). Her blood pressure is 114/73 and her pulse is 96. Her respiration is 24 (abnormal) and oxygen saturation is 95%.   Wt Readings from Last 3 Encounters:  08/07/21 150 lb (68 kg)  07/14/21 152 lb 8 oz (69.2 kg)  07/04/21 154 lb (69.9 kg)    Physical Exam Vitals reviewed.  HENT:     Head: Normocephalic and atraumatic.  Eyes:     Pupils: Pupils are equal, round, and reactive to light.  Cardiovascular:     Rate and Rhythm: Normal rate and regular rhythm.     Heart sounds: Normal heart sounds.   Pulmonary:     Effort: Pulmonary effort is normal.     Breath sounds: Normal breath sounds.  Abdominal:     General: Bowel sounds are normal.     Palpations: Abdomen is soft.  Musculoskeletal:        General: No tenderness or deformity. Normal range of motion.     Cervical back: Normal range of motion.  Lymphadenopathy:     Cervical: No cervical adenopathy.  Skin:    General: Skin is warm and dry.     Findings: No erythema or rash.  Neurological:     Mental Status: She is alert and oriented to person, place, and time.  Psychiatric:  Behavior: Behavior normal.        Thought Content: Thought content normal.        Judgment: Judgment normal.      Lab Results  Component Value Date   WBC 5.4 08/13/2021   HGB 15.0 08/13/2021   HCT 45.4 08/13/2021   MCV 98.5 08/13/2021   PLT 140 (L) 08/13/2021     Chemistry      Component Value Date/Time   NA 140 07/04/2021 1221   NA 143 01/22/2021 1422   NA 141 09/22/2016 1459   K 4.6 07/04/2021 1221   K 3.8 09/22/2016 1459   CL 101 07/04/2021 1221   CO2 31 07/04/2021 1221   CO2 26 09/22/2016 1459   BUN 23 07/04/2021 1221   BUN 24 01/22/2021 1422   BUN 19.8 09/22/2016 1459   CREATININE 1.22 (H) 07/04/2021 1221   CREATININE 1.1 09/22/2016 1459      Component Value Date/Time   CALCIUM 9.6 07/04/2021 1221   CALCIUM 9.2 09/22/2016 1459   ALKPHOS 105 07/04/2021 1221   ALKPHOS 98 09/22/2016 1459   AST 22 07/04/2021 1221   AST 15 09/22/2016 1459   ALT 18 07/04/2021 1221   ALT 21 09/22/2016 1459   BILITOT 0.6 07/04/2021 1221   BILITOT 0.65 09/22/2016 1459      Impression and Plan: Ms. Olvera is a very nice 81 year old postmenopausal white female.  She has a solitary metastatic focus from breast cancer.  Her initial breast cancer was probably 7 years ago.  It was estrogen positive.  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  The HER2 was equivocal on initial staining but on FISH was  negative.  We will be interesting to see what the CA 27.29 is.  I would like to get another PET scan on her.  I would like to go ahead and do this in about 2 months.  We will be interested to see what we find.  Given the fact that she has triple negative metastatic disease, I think that we are going to treat her, we could consider immunotherapy.  We could also consider HER2 therapy since her tumor did stain 2+ on immunohistochemical stain.  Again, our goal here is quality of life.  We really need to make sure that she can do what she would like.  I am glad that she got through radiation without any complications.  I will plan to see her back the end of August.  We will get the PET scan before then.  When we see her back, we will also do Xgeva.   Volanda Napoleon, MD 6/14/20233:30 PM

## 2021-08-13 NOTE — Progress Notes (Signed)
Per previous discussion, blood sample for genetics drawn, and shipped today to [facility]. Genetic referral placed.   Oncology Nurse Navigator Documentation     08/13/2021    3:00 PM  Oncology Nurse Navigator Flowsheets  Planned Course of Treatment Radiation  Phase of Treatment Radiation  Radiation Actual Start Date: 07/30/2021  Radiation Actual End Date: 08/08/2021  Navigator Follow Up Date: 10/30/2021  Navigator Follow Up Reason: Follow-up Appointment  Navigator Location [facility]  Navigator Encounter Type Follow-up Appt;Appt/Treatment Plan Review  Patient Visit Type MedOnc  Treatment Phase Active Tx  Barriers/Navigation Needs Coordination of Care;Employed  Interventions Referrals  Acuity Level 2-Minimal Needs (1-2 Barriers Identified)  Referrals Genetics  Education Method Verbal  Support Groups/Services Friends and Family  Time Spent with Patient 30

## 2021-08-14 ENCOUNTER — Encounter: Payer: Self-pay | Admitting: Oncology

## 2021-08-14 ENCOUNTER — Telehealth: Payer: Self-pay | Admitting: Licensed Clinical Social Worker

## 2021-08-14 LAB — CANCER ANTIGEN 27.29: CA 27.29: 22.6 U/mL (ref 0.0–38.6)

## 2021-08-14 NOTE — Telephone Encounter (Signed)
Scheduled appt per 6/14 referral. Pt is aware of appt date and time. Pt is aware to arrive 15 mins prior to appt time and to bring and updated insurance card. Pt is aware of appt location.

## 2021-08-19 ENCOUNTER — Other Ambulatory Visit: Payer: Self-pay | Admitting: Cardiology

## 2021-08-28 ENCOUNTER — Encounter: Payer: Self-pay | Admitting: Genetic Counselor

## 2021-08-28 DIAGNOSIS — Z1379 Encounter for other screening for genetic and chromosomal anomalies: Secondary | ICD-10-CM | POA: Insufficient documentation

## 2021-08-29 ENCOUNTER — Telehealth: Payer: Self-pay | Admitting: Licensed Clinical Social Worker

## 2021-08-29 NOTE — Telephone Encounter (Signed)
Left message for patient that we have an update about her genetic testing and that we can cancel her genetic counseling appointment. Requested that she call back so we can review testing.

## 2021-09-02 ENCOUNTER — Encounter: Payer: Self-pay | Admitting: Cardiology

## 2021-09-03 ENCOUNTER — Encounter: Payer: Self-pay | Admitting: Oncology

## 2021-09-03 MED ORDER — METOPROLOL SUCCINATE ER 50 MG PO TB24: 75.0000 mg | ORAL_TABLET | Freq: Every day | ORAL | 3 refills | Status: DC

## 2021-09-03 NOTE — Progress Notes (Signed)
                                                                                                                                                             Patient Name: Patricia [name] MRN: [id number] DOB: [birth year] Referring Physician: Burney Gauze (Profile Not Attached) Date of Service: 08/08/2021  [facility] [place]                                                        End Of Treatment Note  Diagnoses: C79.51-Secondary malignant neoplasm of bone  Cancer Staging: STAGE IV  Intent: Palliative  Radiation Treatment Dates: 07/30/2021 through 08/08/2021 Site Technique Total Dose (Gy) Dose per Fx (Gy) Completed Fx Beam Energies  Lumbar Spine: Spine_L5_S1 IMRT/SBRT 35/35 7 5/5 6XFFF  Ribs, Left: Chest_L_9thRib IMRT/SBRT 40/40 8 5/5 6XFFF   Narrative: The patient tolerated radiation therapy relatively well.   Plan: The patient will follow-up with radiation oncology in 48mo -----------------------------------  Eppie Gibson, MD

## 2021-09-04 ENCOUNTER — Encounter: Payer: Self-pay | Admitting: Licensed Clinical Social Worker

## 2021-09-05 ENCOUNTER — Other Ambulatory Visit: Payer: Self-pay

## 2021-09-05 DIAGNOSIS — C799 Secondary malignant neoplasm of unspecified site: Secondary | ICD-10-CM | POA: Diagnosis not present

## 2021-09-05 MED ORDER — METOPROLOL SUCCINATE ER 50 MG PO TB24: 75.0000 mg | ORAL_TABLET | Freq: Every day | ORAL | 2 refills | Status: DC

## 2021-09-19 ENCOUNTER — Other Ambulatory Visit: Payer: Self-pay

## 2021-09-19 ENCOUNTER — Ambulatory Visit
Admission: RE | Admit: 2021-09-19 | Discharge: 2021-09-19 | Disposition: A | Discharge status: Home / Self Care | Payer: Medicare Other | Source: Ambulatory Visit | Attending: Radiation Oncology | Admitting: Radiation Oncology

## 2021-09-19 ENCOUNTER — Encounter: Payer: Self-pay | Admitting: Radiation Oncology

## 2021-09-19 VITALS — BP 114/86 | HR 61 | Temp 97.7°F | Resp 18 | Ht 60.0 in | Wt 148.5 lb

## 2021-09-19 DIAGNOSIS — C7951 Secondary malignant neoplasm of bone: Secondary | ICD-10-CM | POA: Insufficient documentation

## 2021-09-19 NOTE — Progress Notes (Signed)
Radiation Oncology         [phone] ________________________________  Name: Patricia [name] MRN: [id number]  Date: 09/19/2021  DOB: 1940/06/20  Follow-Up Visit Note  Outpatient  CC: Lorrene Reid, PA-C  Volanda Napoleon, MD  Diagnosis and Prior Radiotherapy:    ICD-10-CM   1. Metastasis to bone ([place])  C79.51       CHIEF COMPLAINT: Here for follow-up and surveillance of metastatic cancer to bone  Narrative:  The patient returns today for routine follow-up.  Patricia [name] presents today for follow-up after completing radiation to her lumbar spine and left rib on 08/08/2021  Fatigue: Reports lower energy level, but states she's able to do her daily activities  Pain: Continues to deal with left hip/pelvic pain (3 out of 10).  She states that her pain is improved.  Able to manage with OTC tylenol or an occasional Tramadol.  She denies rib pain. Ambulation: Able to ambulate without an assistive device, but does report that balance seems more impaired. Often holds onto her husband our places her hand along the wall when she's walking MedOnc F/U: Saw Dr. Marin Olp on 08/13/2021 and will F/U with him again after PET scan next month  Other issues of note: Denies any other new or worsening symptoms. Overall reports she feels good and is doing well                              ALLERGIES:  is allergic to [place] [brimonidine tartrate-timolol], other, sulfa antibiotics, and sulfamethoxazole-trimethoprim.  Meds: Current Outpatient Medications  Medication Sig Dispense Refill   ADVAIR HFA 230-21 MCG/ACT inhaler USE 2 INHALATIONS TWICE A DAY 36 g 3   albuterol (PROVENTIL HFA;VENTOLIN HFA) 108 (90 Base) MCG/ACT inhaler Inhale 2 puffs into the lungs every 6 (six) hours as needed for wheezing or shortness of breath.     apixaban (ELIQUIS) 5 MG TABS tablet TAKE 1 TABLET TWICE A DAY 180 tablet 3   Apoaequorin (PREVAGEN) 10 MG CAPS Take 1 capsule by mouth every morning. 90 capsule 3   ARMOUR THYROID 15  MG tablet TAKE 1 TABLET DAILY ALONG WITH 60 MG 90 tablet 1   ARMOUR THYROID 60 MG tablet TAKE 1 TABLET DAILY BEFORE BREAKFAST 90 tablet 1   atorvastatin (LIPITOR) 40 MG tablet TAKE 1 TABLET DAILY (Patient taking differently: Take 40 mg by mouth daily before supper.) 90 tablet 3   bimatoprost (LUMIGAN) 0.01 % SOLN Place 1 drop into both eyes 2 (two) times daily.     calcium-vitamin D (OSCAL WITH D) 500-200 MG-UNIT tablet Take 1 tablet by mouth daily with breakfast. 30 tablet 0   diltiazem (CARDIZEM CD) 300 MG 24 hr capsule TAKE 1 CAPSULE DAILY 90 capsule 2   dorzolamide-timolol (COSOPT) 22.3-6.8 MG/ML ophthalmic solution Place 1 drop into both eyes at bedtime.     fluticasone (FLONASE) 50 MCG/ACT nasal spray 1 spray each nostril following sinus rinses twice daily 16 g 2   furosemide (LASIX) 40 MG tablet Take 1 tablet (40 mg total) by mouth 2 (two) times daily. 180 tablet 3   gabapentin (NEURONTIN) 400 MG capsule TAKE 1 CAPSULE TWICE A DAY 180 capsule 3   L-Methylfolate-B12-B6-B2 (CEREFOLIN) 07-31-48-5 MG TABS TAKE 1 TABLET BY MOUTH EVERY MORNING 90 tablet 3   levETIRAcetam (KEPPRA XR) 500 MG 24 hr tablet Take 1 tablet (500 mg total) by mouth at bedtime. 90 tablet 3   loratadine (CLARITIN) 10 MG  tablet Take 10 mg by mouth daily. (Patient not taking: Reported on 08/13/2021)     memantine (NAMENDA) 10 MG tablet Take 1 tablet (10 mg total) by mouth 2 (two) times daily. 180 tablet 3   metoprolol succinate (TOPROL-XL) 50 MG 24 hr tablet Take 1.5 tablets (75 mg total) by mouth daily. Take with or immediately following a meal. 135 tablet 2   Multiple Vitamins-Iron (MULTIVITAMIN/IRON) TABS Take 1 tablet by mouth daily.     Netarsudil Dimesylate (RHOPRESSA) 0.02 % SOLN Place 1 drop into both eyes at bedtime.     pantoprazole (PROTONIX) 40 MG tablet TAKE 1 TABLET DAILY 90 tablet 3   potassium chloride SA (KLOR-CON M) 20 MEQ tablet TAKE 1 TABLET TWICE A DAY 180 tablet 3   traMADol (ULTRAM) 50 MG tablet Take 1  tablet (50 mg total) by mouth every 6 (six) hours as needed. (Patient not taking: Reported on 08/13/2021) 60 tablet 0   Vitamin D, Ergocalciferol, (DRISDOL) 1.25 MG (50000 UNIT) CAPS capsule Take one capsule weekly (Patient taking differently: Take 50,000 Units by mouth every 7 (seven) days. Sunday) 12 capsule 3   No current facility-administered medications for this encounter.   Facility-Administered Medications Ordered in Other Encounters  Medication Dose Route Frequency Provider Last Rate Last Admin   denosumab (PROLIA) injection 60 mg  60 mg Subcutaneous Once Magrinat, Virgie Dad, MD        Physical Findings: The patient is in no acute distress. Patient is alert and oriented.  height is 5' (1.524 m) and weight is 148 lb 8 oz (67.4 kg). Her oral temperature is 97.7 F (36.5 C). Her blood pressure is 114/86 and her pulse is 61. Her respiration is 18 and oxygen saturation is 94%. .    She is in no acute distress.  She is ambulatory and well-appearing.  No sign of residual radiation skin changes over her lower back.  Lab Findings: Lab Results  Component Value Date   WBC 5.4 08/13/2021   HGB 15.0 08/13/2021   HCT 45.4 08/13/2021   MCV 98.5 08/13/2021   PLT 140 (L) 08/13/2021    Radiographic Findings: No results found.  Impression/Plan:   She is doing well over 1 month post palliative stereotactic body radiation therapy to metastatic bone lesions.  She will continue to follow closely with medical oncology.  She understands that some permanent bone changes may be apparent on her future staging scans.  She understands that PET scan may show some residual inflammation in the areas that she recently received radiation to.  I will see her back on an as-needed basis.  She and her husband are pleased with this plan  On date of service, in total, I spent 25 minutes on this encounter. Patient was seen in person.  _____________________________________   Eppie Gibson, MD

## 2021-09-19 NOTE — Progress Notes (Signed)
Patricia [name] presents today for follow-up after completing radiation to her lumbar spine and left rib on 08/08/2021  Fatigue: Reports lower energy level, but states she's able to do her daily activities  Pain: Continues to deal with left help/pelvic pain (3 out of 10). Able to manage with OTC tylenol or an occasional Tramadol Ambulation: Able to ambulate without an assistive device, but does report that balance seems more impaired. Often holds onto her husband our places her hand along the wall when she's walking MedOnc F/U: Saw Dr. Marin Olp on 08/13/2021 and will F/U with him again after PET scan next month  Other issues of note: Denies any other new or worsening symptoms. Overall reports she feels good and is doing well

## 2021-09-22 ENCOUNTER — Other Ambulatory Visit: Payer: Medicare Other

## 2021-09-22 ENCOUNTER — Encounter: Payer: Medicare Other | Admitting: Licensed Clinical Social Worker

## 2021-10-09 ENCOUNTER — Encounter: Payer: Self-pay | Admitting: Physician Assistant

## 2021-10-09 ENCOUNTER — Ambulatory Visit (INDEPENDENT_AMBULATORY_CARE_PROVIDER_SITE_OTHER): Payer: Medicare Other | Admitting: Physician Assistant

## 2021-10-09 ENCOUNTER — Telehealth: Payer: Self-pay | Admitting: Physician Assistant

## 2021-10-09 VITALS — BP 110/72 | HR 100 | Temp 97.7°F | Ht 60.0 in | Wt 150.0 lb

## 2021-10-09 DIAGNOSIS — E032 Hypothyroidism due to medicaments and other exogenous substances: Secondary | ICD-10-CM | POA: Diagnosis not present

## 2021-10-09 DIAGNOSIS — Z0000 Encounter for general adult medical examination without abnormal findings: Secondary | ICD-10-CM | POA: Diagnosis not present

## 2021-10-09 DIAGNOSIS — R7989 Other specified abnormal findings of blood chemistry: Secondary | ICD-10-CM | POA: Diagnosis not present

## 2021-10-09 DIAGNOSIS — I10 Essential (primary) hypertension: Secondary | ICD-10-CM | POA: Diagnosis not present

## 2021-10-09 NOTE — Patient Instructions (Signed)
Preventive Care 65 Years and Older, Female Preventive care refers to lifestyle choices and visits with your health care provider that can promote health and wellness. Preventive care visits are also called wellness exams. What can I expect for my preventive care visit? Counseling Your health care provider may ask you questions about your: Medical history, including: Past medical problems. Family medical history. Pregnancy and menstrual history. History of falls. Current health, including: Memory and ability to understand (cognition). Emotional well-being. Home life and relationship well-being. Sexual activity and sexual health. Lifestyle, including: Alcohol, nicotine or tobacco, and drug use. Access to firearms. Diet, exercise, and sleep habits. Work and work environment. Sunscreen use. Safety issues such as seatbelt and bike helmet use. Physical exam Your health care provider will check your: Height and weight. These may be used to calculate your BMI (body mass index). BMI is a measurement that tells if you are at a healthy weight. Waist circumference. This measures the distance around your waistline. This measurement also tells if you are at a healthy weight and may help predict your risk of certain diseases, such as type 2 diabetes and high blood pressure. Heart rate and blood pressure. Body temperature. Skin for abnormal spots. What immunizations do I need?  Vaccines are usually given at various ages, according to a schedule. Your health care provider will recommend vaccines for you based on your age, medical history, and lifestyle or other factors, such as travel or where you work. What tests do I need? Screening Your health care provider may recommend screening tests for certain conditions. This may include: Lipid and cholesterol levels. Hepatitis C test. Hepatitis B test. HIV (human immunodeficiency virus) test. STI (sexually transmitted infection) testing, if you are at  risk. Lung cancer screening. Colorectal cancer screening. Diabetes screening. This is done by checking your blood sugar (glucose) after you have not eaten for a while (fasting). Mammogram. Talk with your health care provider about how often you should have regular mammograms. BRCA-related cancer screening. This may be done if you have a family history of breast, ovarian, tubal, or peritoneal cancers. Bone density scan. This is done to screen for osteoporosis. Talk with your health care provider about your test results, treatment options, and if necessary, the need for more tests. Follow these instructions at home: Eating and drinking  Eat a diet that includes fresh fruits and vegetables, whole grains, lean protein, and low-fat dairy products. Limit your intake of foods with high amounts of sugar, saturated fats, and salt. Take vitamin and mineral supplements as recommended by your health care provider. Do not drink alcohol if your health care provider tells you not to drink. If you drink alcohol: Limit how much you have to 0-1 drink a day. Know how much alcohol is in your drink. In the [place] one drink equals one 12 oz bottle of beer (355 mL), one 5 oz glass of wine (148 mL), or one 1 oz glass of hard liquor (44 mL). Lifestyle Brush your teeth every morning and night with fluoride toothpaste. Floss one time each day. Exercise for at least 30 minutes 5 or more days each week. Do not use any products that contain nicotine or tobacco. These products include cigarettes, chewing tobacco, and vaping devices, such as e-cigarettes. If you need help quitting, ask your health care provider. Do not use drugs. If you are sexually active, practice safe sex. Use a condom or other form of protection in order to prevent STIs. Take aspirin only as told by   your health care provider. Make sure that you understand how much to take and what form to take. Work with your health care provider to find out whether it  is safe and beneficial for you to take aspirin daily. Ask your health care provider if you need to take a cholesterol-lowering medicine (statin). Find healthy ways to manage stress, such as: Meditation, yoga, or listening to music. Journaling. Talking to a trusted person. Spending time with friends and family. Minimize exposure to UV radiation to reduce your risk of skin cancer. Safety Always wear your seat belt while driving or riding in a vehicle. Do not drive: If you have been drinking alcohol. Do not ride with someone who has been drinking. When you are tired or distracted. While texting. If you have been using any mind-altering substances or drugs. Wear a helmet and other protective equipment during sports activities. If you have firearms in your house, make sure you follow all gun safety procedures. What's next? Visit your health care provider once a year for an annual wellness visit. Ask your health care provider how often you should have your eyes and teeth checked. Stay up to date on all vaccines. This information is not intended to replace advice given to you by your health care provider. Make sure you discuss any questions you have with your health care provider. Document Revised: 08/14/2020 Document Reviewed: 08/14/2020 Elsevier Patient Education  2023 [employer] Inc.  

## 2021-10-09 NOTE — Telephone Encounter (Signed)
I discussed shingles vaccine with patient in room. She will need to get that at a pharmacy. Insurance will not pay for that in office. AS, CMA

## 2021-10-09 NOTE — Telephone Encounter (Signed)
Patient forgot to mention to [facility] about getting her shingles vaccine and wants to know if she can come back today and get it?

## 2021-10-09 NOTE — Progress Notes (Signed)
Subjective:   Patricia [name] is a 81 y.o. female who presents for Medicare Annual (Subsequent) preventive examination.  Review of Systems    Refer to PCP    I connected with  [name] on 10/09/21 by [place] person visit and verified that I am speaking with the correct person using two identifiers.   I discussed the limitations, risks, security and privacy concerns of performing an evaluation and management service by telephone and the availability of in person appointments. I also discussed with the patient that there may be a patient responsible charge related to this service. The patient expressed understanding and verbally consented to this telephonic visit.  Location of Patient: office Location of Provider: office  List any persons and their role that are participating in the visit with the patient.  Calyx Hawker, CMA  Objective:    There were no vitals filed for this visit. There is no height or weight on file to calculate BMI.     09/19/2021   11:42 AM 08/13/2021    3:20 PM 07/14/2021    8:01 AM 07/04/2021   11:08 AM 05/30/2021   10:59 AM 01/07/2021    8:39 AM 03/24/2020    8:24 PM  Advanced Directives  Does Patient Have a Medical Advance Directive? Yes Yes Yes Yes Yes Yes Yes  Type of Paramedic of [place];Living will Living will;Healthcare Power of [place];Living will Living will;Healthcare Power of Attorney Living will;Healthcare Power of [place];Living will Living will  Does patient want to make changes to medical advance directive?   No - Patient declined  No - Patient declined  No - Guardian declined  Copy of [place] in Chart? No - copy requested  No - copy requested   No - copy requested     Current Medications (verified) Outpatient Encounter Medications as of 10/09/2021  Medication Sig   ADVAIR HFA 230-21 MCG/ACT inhaler USE 2 INHALATIONS TWICE A DAY   albuterol  (PROVENTIL HFA;VENTOLIN HFA) 108 (90 Base) MCG/ACT inhaler Inhale 2 puffs into the lungs every 6 (six) hours as needed for wheezing or shortness of breath.   apixaban (ELIQUIS) 5 MG TABS tablet TAKE 1 TABLET TWICE A DAY   Apoaequorin (PREVAGEN) 10 MG CAPS Take 1 capsule by mouth every morning.   ARMOUR THYROID 15 MG tablet TAKE 1 TABLET DAILY ALONG WITH 60 MG   ARMOUR THYROID 60 MG tablet TAKE 1 TABLET DAILY BEFORE BREAKFAST   atorvastatin (LIPITOR) 40 MG tablet TAKE 1 TABLET DAILY (Patient taking differently: Take 40 mg by mouth daily before supper.)   bimatoprost (LUMIGAN) 0.01 % SOLN Place 1 drop into both eyes 2 (two) times daily.   calcium-vitamin D (OSCAL WITH D) 500-200 MG-UNIT tablet Take 1 tablet by mouth daily with breakfast.   diltiazem (CARDIZEM CD) 300 MG 24 hr capsule TAKE 1 CAPSULE DAILY   dorzolamide-timolol (COSOPT) 22.3-6.8 MG/ML ophthalmic solution Place 1 drop into both eyes at bedtime.   fluticasone (FLONASE) 50 MCG/ACT nasal spray 1 spray each nostril following sinus rinses twice daily   furosemide (LASIX) 40 MG tablet Take 1 tablet (40 mg total) by mouth 2 (two) times daily.   gabapentin (NEURONTIN) 400 MG capsule TAKE 1 CAPSULE TWICE A DAY   L-Methylfolate-B12-B6-B2 (CEREFOLIN) 07-31-48-5 MG TABS TAKE 1 TABLET BY MOUTH EVERY MORNING   levETIRAcetam (KEPPRA XR) 500 MG 24 hr tablet Take 1 tablet (500 mg total) by mouth at bedtime.  loratadine (CLARITIN) 10 MG tablet Take 10 mg by mouth daily. (Patient not taking: Reported on 08/13/2021)   memantine (NAMENDA) 10 MG tablet Take 1 tablet (10 mg total) by mouth 2 (two) times daily.   metoprolol succinate (TOPROL-XL) 50 MG 24 hr tablet Take 1.5 tablets (75 mg total) by mouth daily. Take with or immediately following a meal.   Multiple Vitamins-Iron (MULTIVITAMIN/IRON) TABS Take 1 tablet by mouth daily.   Netarsudil Dimesylate (RHOPRESSA) 0.02 % SOLN Place 1 drop into both eyes at bedtime.   pantoprazole (PROTONIX) 40 MG tablet TAKE  1 TABLET DAILY   potassium chloride SA (KLOR-CON M) 20 MEQ tablet TAKE 1 TABLET TWICE A DAY   traMADol (ULTRAM) 50 MG tablet Take 1 tablet (50 mg total) by mouth every 6 (six) hours as needed. (Patient not taking: Reported on 08/13/2021)   Vitamin D, Ergocalciferol, (DRISDOL) 1.25 MG (50000 UNIT) CAPS capsule Take one capsule weekly (Patient taking differently: Take 50,000 Units by mouth every 7 (seven) days. 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 Dx 04/2015 at time of stroke. b. DCCV 06/2015 - did not hold. Amio started then stopped due to QT prolongation.   Chronic cough    Chronic diastolic heart failure ([place]) 06/17/2015   CKD (chronic kidney disease), stage III (HCC)    Cough variant asthma    Disease of pharynx or nasopharynx    Diverticulosis    Enlargement of right atrium 01/23/2020   Essential hypertension    GERD (gastroesophageal reflux disease)    Glaucoma    Hiatal hernia    Hyperlipidemia    Internal hemorrhoids    Laryngospasm    Mitral regurgitation    mild to moderate by echo 12/2019 and moderate by TEE 12/2020   Multinodular goiter    Osteopenia    Postmenopausal    Pulmonary HTN (HCC)     Moderate with PASP 84mmHg by echo 2018 likely Group 2 from pulmonary venous HTN from CHF>>normal PAP by echo 12/2019   Radiation 10/22/14-11/23/14   Left Breast   Stricture and stenosis of cervix    Past Surgical History:  Procedure Laterality Date   BREAST LUMPECTOMY Left    BREAST SURGERY      CARDIOVERSION N/A 06/24/2015   Procedure: CARDIOVERSION;  Surgeon: Sueanne Margarita, MD;  Location: [facility];  Service: Cardiovascular;  Laterality: N/A;   CARDIOVERSION N/A 05/19/2016   Procedure: CARDIOVERSION;  Surgeon: Sueanne Margarita, MD;  Location: [facility];  Service: Cardiovascular;  Laterality: N/A;   COLONOSCOPY     LUNG CANCER SURGERY  2003   resection carcinoid lingula-lt upper lobe   RADIOACTIVE SEED GUIDED PARTIAL MASTECTOMY WITH AXILLARY SENTINEL LYMPH NODE BIOPSY Left 06/26/2014   Procedure: RADIOACTIVE SEED GUIDED PARTIAL MASTECTOMY WITH AXILLARY SENTINEL LYMPH NODE BIOPSY;  Surgeon: Stark Klein, MD;  Location: [facility];  Service: General;  Laterality: Left;   RE-EXCISION OF BREAST LUMPECTOMY Left 08/07/2014   Procedure: RE-EXCISION OF LEFT BREAST LUMPECTOMY;  Surgeon: Stark Klein,  MD;  Location: [facility];  Service: General;  Laterality: Left;   RIGHT HEART CATH N/A 04/27/2016   Procedure: Right Heart Cath;  Surgeon: Larey Dresser, MD;  Location: [facility];  Service: Cardiovascular;  Laterality: N/A;   TEE WITHOUT CARDIOVERSION N/A 01/07/2021   Procedure: TRANSESOPHAGEAL ECHOCARDIOGRAM (TEE);  Surgeon: Acie Fredrickson, Wonda Cheng, MD;  [facility];  Service: Cardiovascular;  Laterality: N/A;   Family History  Problem Relation Age of Onset   Stroke Mother    Hypertension Mother    Hyperlipidemia Mother    Stroke Father    Transient ischemic attack Father    Hyperlipidemia Father    Hypertension Father    Atrial fibrillation Son    Heart attack Neg Hx    Social History   Socioeconomic History   Marital status: Married    Spouse name: [name]   Number of children: Not on file   Years of education: Not on file   Highest education level: Not on file  Occupational History   Not on file  Tobacco Use   Smoking status: Former    Packs/day: 0.75    Years: 5.00    Total pack years: 3.75    Types: Cigarettes    Quit date:  06/15/1966    Years since quitting: 55.3    Passive exposure: Past   Smokeless tobacco: Never  Vaping Use   Vaping Use: Never used  Substance and Sexual Activity   Alcohol use: Not Currently    Comment: occasional wine   Drug use: No   Sexual activity: Not Currently    Birth control/protection: Post-menopausal  Other Topics Concern   Not on file  Social History Narrative   Retired. Lives with husband.    Right Handed   Drinks 1-2 cups caffeine daily   Social Determinants of Health   Financial Resource Strain: Not on file  Food Insecurity: Not on file  Transportation Needs: Not on file  Physical Activity: Not on file  Stress: Not on file  Social Connections: Not on file    Tobacco Counseling Counseling given: Not Answered   Clinical Intake:                 Diabetic?NO         Activities of Daily Living    11/28/2020    9:25 AM 10/15/2020    8:20 AM  In your present state of health, do you have any difficulty performing the following activities:  Hearing? 0 0  Vision? 0 0  Difficulty concentrating or making decisions? 0 0  Walking or climbing stairs? 0 0  Dressing or bathing? 0 0  Doing errands, shopping? 0 0    Patient Care Team: Lorrene Reid, PA-C as PCP - General Sueanne Margarita, MD as PCP - Cardiology (Cardiology) Constance Haw, MD as PCP - Electrophysiology (Cardiology) Thea Silversmith, MD as Consulting Physician (Radiation Oncology) Stark Klein, MD as Consulting Physician (General Surgery) Ladene Artist, MD as Consulting Physician (Gastroenterology) Marygrace Drought, MD as Consulting Physician (Ophthalmology) Garvin Fila, MD as Consulting Physician (Neurology) Keane Scrape, PhD as Referring Physician (Neurology) Marin Olp Rudell Cobb, MD as Medical Oncologist (Oncology) Cordelia Poche, RN as Oncology Nurse Navigator  Indicate any recent Medical Services you may have received from other than [facility] providers in the past year  (date may be approximate).     Assessment:   This is a routine wellness examination for [facility].  Hearing/Vision screen No  results found.  Dietary issues and exercise activities discussed:     Goals Addressed   None   Depression Screen    11/28/2020    9:25 AM 10/15/2020    8:20 AM 10/03/2020   10:36 AM 06/03/2020    1:39 PM 09/28/2019    1:20 PM 03/06/2019    2:54 PM 12/05/2018    2:47 PM  PHQ 2/9 Scores  PHQ - 2 Score 0 0 0 0 0 0 1  PHQ- 9 Score 0 0 0 0 0 0 9    Fall Risk    04/10/2021    9:32 AM 11/28/2020    9:25 AM 10/15/2020    8:20 AM 10/03/2020   10:36 AM 06/03/2020    1:39 PM  Fall Risk   Falls in the past year? 0 0 0 0 0  Number falls in past yr: 0 0 0 0   Injury with Fall? 0 0 0 0   Risk for fall due to : No Fall Risks   No Fall Risks   Follow up Falls evaluation completed Falls evaluation completed Falls evaluation completed Falls evaluation completed Falls evaluation completed    [place]:  Any stairs in or around the home? Yes  If so, are there any without handrails? Yes  Home free of loose throw rugs in walkways, pet beds, electrical cords, etc? Yes  Adequate lighting in your home to reduce risk of falls? Yes   ASSISTIVE DEVICES UTILIZED TO PREVENT FALLS:  Life alert? Yes  Use of a cane, walker or w/c? No  Grab bars in the bathroom? Yes  Shower chair or bench in shower? Yes  Elevated toilet seat or a handicapped toilet? Yes   TIMED UP AND GO:  Was the test performed? Yes .  Length of time to ambulate 10 feet: 14 sec.   Gait slow and steady without use of assistive device  Cognitive Function: followed by neurology    08/07/2021    8:35 AM 02/04/2021    8:29 AM 09/12/2020    3:06 PM 09/27/2017    5:07 PM 03/08/2017    3:47 PM  MMSE - Mini Mental State Exam  Orientation to time 0 4 3 4 5   Orientation to Place 4 4 5 5 5   Registration 3 3 3 3 3   Attention/ Calculation 5 2 5 5 5   Recall 2 2 2 3 3   Language- name 2  objects 2 2 2 2 2   Language- repeat 1 1 1 1 1   Language- follow 3 step command 3 3 3 3 3   Language- read & follow direction 1 1 1 1 1   Write a sentence 1 1 1 1 1   Copy design 1 1 1 1 1   Total score 23 24 27 29 30         10/03/2020   10:26 AM 09/28/2019    1:21 PM 12/05/2018    2:49 PM 03/08/2017    3:47 PM  6CIT Screen  What Year? 0 points 0 points 0 points 0 points  What month? 3 points 0 points 0 points 0 points  What time? 3 points 0 points 0 points 0 points  Count back from 20 0 points 0 points 0 points 0 points  Months in reverse 0 points 0 points 0 points 0 points  Repeat phrase 6 points 0 points 0 points 2 points  Total Score 12 points 0 points 0 points 2 points  Immunizations Immunization History  Administered Date(s) Administered   Hepatitis A 10/01/2015   Hepatitis A, Adult 12/29/2012   Influenza Split 12/11/2010, 12/01/2011   Influenza Whole 01/21/2007, 01/01/2009, 11/28/2009   Influenza, High Dose Seasonal PF 12/01/2013, 12/14/2014, 12/02/2015, 12/18/2016, 12/14/2017, 11/12/2020   Influenza,inj,Quad PF,6+ Mos 12/29/2012, 12/01/2013   Influenza-Unspecified 12/18/2016, 12/14/2019   Moderna Sars-Covid-2 Vaccination 11/12/2020   PFIZER(Purple Top)SARS-COV-2 Vaccination 03/14/2019, 04/03/2019, 11/27/2019   Pneumococcal Conjugate-13 02/08/2014   Pneumococcal Polysaccharide-23 03/02/2005, 01/26/2011   Td 03/02/2000, 12/29/2012   Typhoid Inactivated 10/01/2015   Yellow Fever 10/01/2015   Zoster, Live 05/03/2006    TDAP status: Up to date  Flu Vaccine status: Up to date  Pneumococcal vaccine status: Up to date  Covid-19 vaccine status: Completed vaccines  Qualifies for Shingles Vaccine? Yes   Zostavax completed No   Shingrix Completed?: No.    Education has been provided regarding the importance of this vaccine. Patient has been advised to call insurance company to determine out of pocket expense if they have not yet received this vaccine. Advised may also  receive vaccine at local pharmacy or Health Dept. Verbalized acceptance and understanding.  Screening Tests Health Maintenance  Topic Date Due   Zoster Vaccines- Shingrix (1 of 2) Never done   COVID-19 Vaccine (5 - Pfizer risk series) 01/07/2021   INFLUENZA VACCINE  09/30/2021   MAMMOGRAM  06/27/2022   TETANUS/TDAP  12/30/2022   Pneumonia Vaccine 5+ Years old  Completed   DEXA SCAN  Completed   HPV VACCINES  Aged Out    Health Maintenance  Health Maintenance Due  Topic Date Due   Zoster Vaccines- Shingrix (1 of 2) Never done   COVID-19 Vaccine (5 - Pfizer risk series) 01/07/2021   INFLUENZA VACCINE  09/30/2021    Colorectal cancer screening: No longer required.   Mammogram status: No longer required due to age.  Dexa: Patient declined  Lung Cancer Screening: (Low Dose CT Chest recommended if Age 30-80 years, 30 pack-year currently smoking OR have quit w/in 15years.) does not qualify.   Lung Cancer Screening Referral:   Additional Screening:  Hepatitis C Screening: does qualify; Patient declined  Vision Screening: Recommended annual ophthalmology exams for early detection of glaucoma and other disorders of the eye. Is the patient up to date with their annual eye exam?  Yes  Who is the provider or what is the name of the office in which the patient attends annual eye exams? Dr. Earma Reading If pt is not established with a provider, would they like to be referred to a provider to establish care? No .   Dental Screening: Recommended annual dental exams for proper oral hygiene  Community Resource Referral / Chronic Care Management: CRR required this visit?  No   CCM required this visit?  No      Plan:     I have personally reviewed and noted the following in the patient's chart:   Medical and social history Use of alcohol, tobacco or illicit drugs  Current medications and supplements including opioid prescriptions.  Functional ability and status Nutritional  status Physical activity Advanced directives List of other physicians Hospitalizations, surgeries, and ER visits in previous 12 months Vitals Screenings to include cognitive, depression, and falls Referrals and appointments  In addition, I have reviewed and discussed with patient certain preventive protocols, quality metrics, and best practice recommendations. A written personalized care plan for preventive services as well as general preventive health recommendations were provided to patient.     Chesley Noon  Jerilynn Mages Kriya Westra, [place]   10/09/2021   Nurse Notes:   Face to Face 20 minutes   Ms. Cobbins , Thank you for taking time to come for your Medicare Wellness Visit. I appreciate your ongoing commitment to your health goals. Please review the following plan we discussed and let me know if I can assist you in the future.   These are the goals we discussed:  Goals   None    Shingles vaccine  This is a list of the screening recommended for you and due dates:  Health Maintenance  Topic Date Due   Zoster (Shingles) Vaccine (1 of 2) Never done   COVID-19 Vaccine (5 - Pfizer risk series) 01/07/2021   Flu Shot  09/30/2021   Mammogram  06/27/2022   Tetanus Vaccine  12/30/2022   Pneumonia Vaccine  Completed   DEXA scan (bone density measurement)  Completed   HPV Vaccine  Aged Out

## 2021-10-09 NOTE — Telephone Encounter (Signed)
Patient aware.

## 2021-10-10 LAB — COMPREHENSIVE METABOLIC PANEL
ALT: 18 IU/L (ref 0–32)
AST: 21 IU/L (ref 0–40)
Albumin/Globulin Ratio: 2 (ref 1.2–2.2)
Albumin: 4.3 g/dL (ref 3.8–4.8)
Alkaline Phosphatase: 84 IU/L (ref 44–121)
BUN/Creatinine Ratio: 17 (ref 12–28)
BUN: 17 mg/dL (ref 8–27)
Bilirubin Total: 0.5 mg/dL (ref 0.0–1.2)
CO2: 23 mmol/L (ref 20–29)
Calcium: 8.9 mg/dL (ref 8.7–10.3)
Chloride: 105 mmol/L (ref 96–106)
Creatinine, Ser: 1.01 mg/dL — ABNORMAL HIGH (ref 0.57–1.00)
Globulin, Total: 2.2 g/dL (ref 1.5–4.5)
Glucose: 95 mg/dL (ref 70–99)
Potassium: 4.2 mmol/L (ref 3.5–5.2)
Sodium: 143 mmol/L (ref 134–144)
Total Protein: 6.5 g/dL (ref 6.0–8.5)
eGFR: 56 mL/min/{1.73_m2} — ABNORMAL LOW (ref >59)

## 2021-10-10 LAB — T4, FREE: Free T4: 1.09 ng/dL (ref 0.82–1.77)

## 2021-10-10 LAB — CBC
Hematocrit: 40.5 % (ref 34.0–46.6)
Hemoglobin: 14.5 g/dL (ref 11.1–15.9)
MCH: 33.6 pg — ABNORMAL HIGH (ref 26.6–33.0)
MCHC: 35.8 g/dL — ABNORMAL HIGH (ref 31.5–35.7)
MCV: 94 fL (ref 79–97)
Platelets: 249 10*3/uL (ref 150–450)
RBC: 4.31 x10E6/uL (ref 3.77–5.28)
RDW: 13.6 % (ref 11.7–15.4)
WBC: 5.5 10*3/uL (ref 3.4–10.8)

## 2021-10-10 LAB — TSH: TSH: 2.69 u[IU]/mL (ref 0.450–4.500)

## 2021-10-10 LAB — HEMOGLOBIN A1C
Est. average glucose Bld gHb Est-mCnc: 120 mg/dL
Hgb A1c MFr Bld: 5.8 % — ABNORMAL HIGH (ref 4.8–5.6)

## 2021-10-10 LAB — LDL CHOLESTEROL, DIRECT: LDL Direct: 64 mg/dL (ref 0–99)

## 2021-10-10 LAB — T3: T3, Total: 158 ng/dL (ref 71–180)

## 2021-10-17 ENCOUNTER — Other Ambulatory Visit: Payer: Self-pay | Admitting: Physician Assistant

## 2021-10-17 DIAGNOSIS — E032 Hypothyroidism due to medicaments and other exogenous substances: Secondary | ICD-10-CM

## 2021-10-20 NOTE — Progress Notes (Signed)
Prolia supportive care plan changed to [facility] q29months per Dr. Antonieta Pert instructions.

## 2021-10-24 ENCOUNTER — Encounter (HOSPITAL_COMMUNITY)
Admission: RE | Admit: 2021-10-24 | Discharge: 2021-10-24 | Disposition: A | Discharge status: Home / Self Care | Payer: Medicare Other | Source: Ambulatory Visit | Attending: Hematology & Oncology | Admitting: Hematology & Oncology

## 2021-10-24 DIAGNOSIS — C50412 Malignant neoplasm of upper-outer quadrant of left female breast: Secondary | ICD-10-CM | POA: Diagnosis not present

## 2021-10-24 DIAGNOSIS — C7951 Secondary malignant neoplasm of bone: Secondary | ICD-10-CM | POA: Diagnosis not present

## 2021-10-24 DIAGNOSIS — Z170 Estrogen receptor positive status [ER+]: Secondary | ICD-10-CM | POA: Insufficient documentation

## 2021-10-24 LAB — GLUCOSE, CAPILLARY: Glucose-Capillary: 110 mg/dL — ABNORMAL HIGH (ref 70–99)

## 2021-10-24 MED ORDER — FLUDEOXYGLUCOSE F - 18 (FDG) INJECTION
7.5000 | Freq: Once | INTRAVENOUS | Status: AC
Start: 2021-10-24 — End: 2021-10-24
  Administered 2021-10-24: 7.5 via INTRAVENOUS

## 2021-10-28 ENCOUNTER — Other Ambulatory Visit: Payer: Self-pay | Admitting: Nurse Practitioner

## 2021-10-28 DIAGNOSIS — E559 Vitamin D deficiency, unspecified: Secondary | ICD-10-CM

## 2021-10-30 ENCOUNTER — Inpatient Hospital Stay (HOSPITAL_BASED_OUTPATIENT_CLINIC_OR_DEPARTMENT_OTHER): Payer: Medicare Other | Admitting: Hematology & Oncology

## 2021-10-30 ENCOUNTER — Inpatient Hospital Stay: Payer: Medicare Other

## 2021-10-30 ENCOUNTER — Encounter: Payer: Self-pay | Admitting: Hematology & Oncology

## 2021-10-30 ENCOUNTER — Inpatient Hospital Stay: Payer: Medicare Other | Attending: Hematology & Oncology

## 2021-10-30 ENCOUNTER — Other Ambulatory Visit: Payer: Self-pay | Admitting: Oncology

## 2021-10-30 VITALS — BP 131/63 | HR 111 | Temp 97.6°F | Resp 24 | Ht 60.0 in | Wt 150.0 lb

## 2021-10-30 DIAGNOSIS — Z853 Personal history of malignant neoplasm of breast: Secondary | ICD-10-CM | POA: Diagnosis not present

## 2021-10-30 DIAGNOSIS — C7951 Secondary malignant neoplasm of bone: Secondary | ICD-10-CM | POA: Diagnosis not present

## 2021-10-30 DIAGNOSIS — C50511 Malignant neoplasm of lower-outer quadrant of right female breast: Secondary | ICD-10-CM | POA: Diagnosis not present

## 2021-10-30 DIAGNOSIS — M8580 Other specified disorders of bone density and structure, unspecified site: Secondary | ICD-10-CM

## 2021-10-30 DIAGNOSIS — Z171 Estrogen receptor negative status [ER-]: Secondary | ICD-10-CM | POA: Diagnosis not present

## 2021-10-30 DIAGNOSIS — Z170 Estrogen receptor positive status [ER+]: Secondary | ICD-10-CM

## 2021-10-30 DIAGNOSIS — M810 Age-related osteoporosis without current pathological fracture: Secondary | ICD-10-CM

## 2021-10-30 LAB — CBC WITH DIFFERENTIAL (CANCER CENTER ONLY)
Abs Immature Granulocytes: 0.04 10*3/uL (ref 0.00–0.07)
Basophils Absolute: 0 10*3/uL (ref 0.0–0.1)
Basophils Relative: 0 %
Eosinophils Absolute: 0 10*3/uL (ref 0.0–0.5)
Eosinophils Relative: 0 %
HCT: 42.1 % (ref 36.0–46.0)
Hemoglobin: 13.9 g/dL (ref 12.0–15.0)
Immature Granulocytes: 1 %
Lymphocytes Relative: 15 %
Lymphs Abs: 0.7 10*3/uL (ref 0.7–4.0)
MCH: 33.2 pg (ref 26.0–34.0)
MCHC: 33 g/dL (ref 30.0–36.0)
MCV: 100.5 fL — ABNORMAL HIGH (ref 80.0–100.0)
Monocytes Absolute: 0.4 10*3/uL (ref 0.1–1.0)
Monocytes Relative: 8 %
Neutro Abs: 3.5 10*3/uL (ref 1.7–7.7)
Neutrophils Relative %: 76 %
Platelet Count: 204 10*3/uL (ref 150–400)
RBC: 4.19 MIL/uL (ref 3.87–5.11)
RDW: 13.8 % (ref 11.5–15.5)
WBC Count: 4.7 10*3/uL (ref 4.0–10.5)
nRBC: 0 % (ref 0.0–0.2)

## 2021-10-30 LAB — CMP (CANCER CENTER ONLY)
ALT: 16 U/L (ref 0–44)
AST: 20 U/L (ref 15–41)
Albumin: 4.1 g/dL (ref 3.5–5.0)
Alkaline Phosphatase: 71 U/L (ref 38–126)
Anion gap: 8 (ref 5–15)
BUN: 23 mg/dL (ref 8–23)
CO2: 28 mmol/L (ref 22–32)
Calcium: 9.6 mg/dL (ref 8.9–10.3)
Chloride: 105 mmol/L (ref 98–111)
Creatinine: 1.23 mg/dL — ABNORMAL HIGH (ref 0.44–1.00)
GFR, Estimated: 44 mL/min — ABNORMAL LOW (ref >60)
Glucose, Bld: 142 mg/dL — ABNORMAL HIGH (ref 70–99)
Potassium: 4.2 mmol/L (ref 3.5–5.1)
Sodium: 141 mmol/L (ref 135–145)
Total Bilirubin: 0.5 mg/dL (ref 0.3–1.2)
Total Protein: 7 g/dL (ref 6.5–8.1)

## 2021-10-30 LAB — CEA (IN HOUSE-CHCC): CEA (CHCC-In House): 3.31 ng/mL (ref 0.00–5.00)

## 2021-10-30 LAB — LACTATE DEHYDROGENASE: LDH: 238 U/L — ABNORMAL HIGH (ref 98–192)

## 2021-10-30 MED ORDER — TRAMADOL HCL 50 MG PO TABS: 50.0000 mg | ORAL_TABLET | Freq: Four times a day (QID) | ORAL | 0 refills | Status: DC | PRN

## 2021-10-30 MED ORDER — DENOSUMAB 120 MG/1.7ML ~~LOC~~ SOLN
120.0000 mg | Freq: Once | SUBCUTANEOUS | Status: AC
  Administered 2021-10-30: 120 mg via SUBCUTANEOUS
  Filled 2021-10-30: qty 1.7

## 2021-10-30 NOTE — Patient Instructions (Signed)
Denosumab injection What is this medication? DENOSUMAB (den oh sue mab) slows bone breakdown. Prolia is used to treat osteoporosis in women after menopause and in men, and in people who are taking corticosteroids for 6 months or more. Xgeva is used to treat a high calcium level due to cancer and to prevent bone fractures and other bone problems caused by multiple myeloma or cancer bone metastases. Xgeva is also used to treat giant cell tumor of the bone. This medicine may be used for other purposes; ask your health care provider or pharmacist if you have questions. COMMON BRAND NAME(S): Prolia, XGEVA What should I tell my care team before I take this medication? They need to know if you have any of these conditions: dental disease having surgery or tooth extraction infection kidney disease low levels of calcium or Vitamin D in the blood malnutrition on hemodialysis skin conditions or sensitivity thyroid or parathyroid disease an unusual reaction to denosumab, other medicines, foods, dyes, or preservatives pregnant or trying to get pregnant breast-feeding How should I use this medication? This medicine is for injection under the skin. It is given by a health care professional in a hospital or clinic setting. A special MedGuide will be given to you before each treatment. Be sure to read this information carefully each time. For Prolia, talk to your pediatrician regarding the use of this medicine in children. Special care may be needed. For Xgeva, talk to your pediatrician regarding the use of this medicine in children. While this drug may be prescribed for children as young as 13 years for selected conditions, precautions do apply. Overdosage: If you think you have taken too much of this medicine contact a poison control center or emergency room at once. NOTE: This medicine is only for you. Do not share this medicine with others. What if I miss a dose? It is important not to miss your dose.  Call your doctor or health care professional if you are unable to keep an appointment. What may interact with this medication? Do not take this medicine with any of the following medications: other medicines containing denosumab This medicine may also interact with the following medications: medicines that lower your chance of fighting infection steroid medicines like prednisone or cortisone This list may not describe all possible interactions. Give your health care provider a list of all the medicines, herbs, non-prescription drugs, or dietary supplements you use. Also tell them if you smoke, drink alcohol, or use illegal drugs. Some items may interact with your medicine. What should I watch for while using this medication? Visit your doctor or health care professional for regular checks on your progress. Your doctor or health care professional may order blood tests and other tests to see how you are doing. Call your doctor or health care professional for advice if you get a fever, chills or sore throat, or other symptoms of a cold or flu. Do not treat yourself. This drug may decrease your body's ability to fight infection. Try to avoid being around people who are sick. You should make sure you get enough calcium and vitamin D while you are taking this medicine, unless your doctor tells you not to. Discuss the foods you eat and the vitamins you take with your health care professional. See your dentist regularly. Brush and floss your teeth as directed. Before you have any dental work done, tell your dentist you are receiving this medicine. Do not become pregnant while taking this medicine or for 5 months after   stopping it. Talk with your doctor or health care professional about your birth control options while taking this medicine. Women should inform their doctor if they wish to become pregnant or think they might be pregnant. There is a potential for serious side effects to an unborn child. Talk to  your health care professional or pharmacist for more information. ?What side effects may I notice from receiving this medication? ?Side effects that you should report to your doctor or health care professional as soon as possible: ?allergic reactions like skin rash, itching or hives, swelling of the face, lips, or tongue ?bone pain ?breathing problems ?dizziness ?jaw pain, especially after dental work ?redness, blistering, peeling of the skin ?signs and symptoms of infection like fever or chills; cough; sore throat; pain or trouble passing urine ?signs of low calcium like fast heartbeat, muscle cramps or muscle pain; pain, tingling, numbness in the hands or feet; seizures ?unusual bleeding or bruising ?unusually weak or tired ?Side effects that usually do not require medical attention (report to your doctor or health care professional if they continue or are bothersome): ?constipation ?diarrhea ?headache ?joint pain ?loss of appetite ?muscle pain ?runny nose ?tiredness ?upset stomach ?This list may not describe all possible side effects. Call your doctor for medical advice about side effects. You may report side effects to FDA at 1-800-FDA-1088. ?Where should I keep my medication? ?This medicine is only given in a clinic, doctor's office, or other health care setting and will not be stored at home. ?NOTE: This sheet is a summary. It may not cover all possible information. If you have questions about this medicine, talk to your doctor, pharmacist, or health care provider. ?? 2023 Elsevier/Gold Standard (2017-06-25 00:00:00) ? ?

## 2021-10-30 NOTE — Progress Notes (Signed)
Hematology and Oncology Follow Up Visit  Patricia [name] [id number] Aug 07, 1940 81 y.o. 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  We did do a PET scan on her.  This was done a week or so ago.  Surprisingly, the PET scan shows that there is a new area in the left hip area.  This is active on the PET scan.  It was not noted before.  Where she had the radiation to the L5 vertebral body, this looks fine now.  In the left acetabulum, the lesion has an SUV of 3.5.  I think going to have to get radiation involved with this also.  I would like to go ahead and get a CT scan of the left hip so we can get a better look at to the extent of this lesion and to see if there is any type of cortical disruption.  She is having some pain in the left hip area.  She has not had any problems with cough or shortness of breath.  She has had no nausea or vomiting.  There has been no change in bowel or bladder habits.  She has had no bleeding.  She has had no leg swelling.  Her last CA 27.29 was 23.  Overall, I would say her performance status is probably ECOG 1.     Medications:  Current Outpatient Medications:    ADVAIR HFA 230-21 MCG/ACT inhaler, USE 2 INHALATIONS TWICE A DAY, Disp: 36 g, Rfl: 3   albuterol (PROVENTIL HFA;VENTOLIN HFA) 108 (90 Base) MCG/ACT inhaler, Inhale 2 puffs into the lungs every 6 (six) hours as needed for wheezing or shortness of breath., Disp: , Rfl:    apixaban (ELIQUIS) 5 MG TABS tablet, TAKE 1 TABLET TWICE A DAY, Disp: 180 tablet, Rfl: 3   Apoaequorin (PREVAGEN) 10 MG CAPS, Take 1 capsule by mouth every morning., Disp: 90 capsule, Rfl: 3   ARMOUR THYROID 15 MG tablet, TAKE 1 TABLET DAILY ALONG WITH 60  MG, Disp: 90 tablet, Rfl: 1   ARMOUR THYROID 60 MG tablet, TAKE 1 TABLET DAILY BEFORE BREAKFAST, Disp: 90 tablet, Rfl: 0   atorvastatin (LIPITOR) 40 MG tablet, TAKE 1 TABLET DAILY (Patient taking differently: Take 40 mg by mouth daily before supper.), Disp: 90 tablet, Rfl: 3   bimatoprost (LUMIGAN) 0.01 % SOLN, Place 1 drop into both eyes 2 (two) times daily., Disp: , Rfl:    calcium-vitamin D (OSCAL WITH D) 500-200 MG-UNIT tablet, Take 1 tablet by mouth daily with breakfast., Disp: 30 tablet, Rfl: 0   diltiazem (CARDIZEM CD) 300 MG 24 hr capsule, TAKE 1 CAPSULE DAILY, Disp: 90 capsule, Rfl: 2   dorzolamide-timolol (COSOPT) 22.3-6.8 MG/ML ophthalmic solution, Place 1 drop into both eyes at bedtime., Disp: , Rfl:    fluticasone (FLONASE) 50 MCG/ACT nasal spray, 1 spray each nostril following sinus rinses twice daily, Disp: 16 g, Rfl: 2   furosemide (LASIX) 40 MG tablet, Take 1 tablet (40 mg total) by mouth 2 (two) times daily., Disp: 180 tablet, Rfl: 3   gabapentin (NEURONTIN) 400 MG capsule, TAKE 1 CAPSULE TWICE A DAY, Disp: 180 capsule, Rfl: 3   L-Methylfolate-B12-B6-B2 (CEREFOLIN) 07-31-48-5 MG TABS, TAKE 1 TABLET BY MOUTH EVERY MORNING, Disp:  90 tablet, Rfl: 3   levETIRAcetam (KEPPRA XR) 500 MG 24 hr tablet, Take 1 tablet (500 mg total) by mouth at bedtime., Disp: 90 tablet, Rfl: 3   loratadine (CLARITIN) 10 MG tablet, Take 10 mg by mouth daily., Disp: , Rfl:    memantine (NAMENDA) 10 MG tablet, Take 1 tablet (10 mg total) by mouth 2 (two) times daily., Disp: 180 tablet, Rfl: 3   metoprolol succinate (TOPROL-XL) 50 MG 24 hr tablet, Take 1.5 tablets (75 mg total) by mouth daily. Take with or immediately following a meal., Disp: 135 tablet, Rfl: 2   Multiple Vitamins-Iron (MULTIVITAMIN/IRON) TABS, Take 1 tablet by mouth daily., Disp: , Rfl:    Netarsudil Dimesylate (RHOPRESSA) 0.02 % SOLN, Place 1 drop into both eyes at bedtime., Disp: , Rfl:    pantoprazole (PROTONIX) 40 MG tablet, TAKE 1 TABLET  DAILY, Disp: 90 tablet, Rfl: 3   potassium chloride SA (KLOR-CON M) 20 MEQ tablet, TAKE 1 TABLET TWICE A DAY, Disp: 180 tablet, Rfl: 3   traMADol (ULTRAM) 50 MG tablet, Take 1 tablet (50 mg total) by mouth every 6 (six) hours as needed., Disp: 60 tablet, Rfl: 0   Vitamin D, Ergocalciferol, (DRISDOL) 1.25 MG (50000 UNIT) CAPS capsule, TAKE 1 CAPSULE WEEKLY, Disp: 12 capsule, Rfl: 3  Allergies:  Allergies  Allergen Reactions   Combigan [Brimonidine Tartrate-Timolol] Itching    Eyes itch, reddened   Other Other (See Comments)    Per patient made OU red, Sore, and sensitivity to light   Sulfa Antibiotics Rash   Sulfamethoxazole-Trimethoprim Hives    Past Medical History, Surgical history, Social history, and Family History were reviewed and updated.  Review of Systems: Review of Systems  Constitutional: Negative.   HENT:  Negative.    Eyes: Negative.   Respiratory: Negative.    Cardiovascular: Negative.   Gastrointestinal: Negative.   Endocrine: Negative.   Genitourinary: Negative.    Musculoskeletal: Negative.   Skin: Negative.   Neurological: Negative.   Hematological: Negative.   Psychiatric/Behavioral: Negative.      Physical Exam:  height is 5' (1.524 m) and weight is 150 lb (68 kg). Her oral temperature is 97.6 F (36.4 C). Her blood pressure is 131/63 and her pulse is 111 (abnormal). Her respiration is 24 (abnormal) and oxygen saturation is 95%.   Wt Readings from Last 3 Encounters:  10/30/21 150 lb (68 kg)  10/09/21 150 lb (68 kg)  09/19/21 148 lb 8 oz (67.4 kg)    Physical Exam Vitals reviewed.  HENT:     Head: Normocephalic and atraumatic.  Eyes:     Pupils: Pupils are equal, round, and reactive to light.  Cardiovascular:     Rate and Rhythm: Normal rate and regular rhythm.     Heart sounds: Normal heart sounds.  Pulmonary:     Effort: Pulmonary effort is normal.     Breath sounds: Normal breath sounds.  Abdominal:     General: Bowel sounds are normal.      Palpations: Abdomen is soft.  Musculoskeletal:        General: No tenderness or deformity. Normal range of motion.     Cervical back: Normal range of motion.  Lymphadenopathy:     Cervical: No cervical adenopathy.  Skin:    General: Skin is warm and dry.     Findings: No erythema or rash.  Neurological:     Mental Status: She is alert and oriented to person, place, and time.  Psychiatric:  Behavior: Behavior normal.        Thought Content: Thought content normal.        Judgment: Judgment normal.     Lab Results  Component Value Date   WBC 2.1 (L) 10/30/2021   HGB 13.9 10/30/2021   HCT 42.1 10/30/2021   MCV 100.5 (H) 10/30/2021   PLT 204 10/30/2021     Chemistry      Component Value Date/Time   NA 143 10/09/2021 1056   NA 141 09/22/2016 1459   K 4.2 10/09/2021 1056   K 3.8 09/22/2016 1459   CL 105 10/09/2021 1056   CO2 23 10/09/2021 1056   CO2 26 09/22/2016 1459   BUN 17 10/09/2021 1056   BUN 19.8 09/22/2016 1459   CREATININE 1.01 (H) 10/09/2021 1056   CREATININE 1.34 (H) 08/13/2021 1508   CREATININE 1.1 09/22/2016 1459      Component Value Date/Time   CALCIUM 8.9 10/09/2021 1056   CALCIUM 9.2 09/22/2016 1459   ALKPHOS 84 10/09/2021 1056   ALKPHOS 98 09/22/2016 1459   AST 21 10/09/2021 1056   AST 19 08/13/2021 1508   AST 15 09/22/2016 1459   ALT 18 10/09/2021 1056   ALT 18 08/13/2021 1508   ALT 21 09/22/2016 1459   BILITOT 0.5 10/09/2021 1056   BILITOT 0.6 08/13/2021 1508   BILITOT 0.65 09/22/2016 1459      Impression and Plan: Patricia [name] is a very nice 81 year old postmenopausal white female.  She has a solitary metastatic focus from breast cancer.  Her initial breast cancer was probably 7 years ago.  It was estrogen positive.  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  The HER2 was equivocal on initial staining but on FISH was negative.  I am really surprised by the fact that there does appear to be a new lesion  in the left hip.  I really thought that once she had radiation for the L5 that she would have a relatively long disease-free interval.  I think the fact that she now has a new lesion in the left hip is probably indicative of the aggressiveness of her triple negative disease.  I talked to she and her husband about the possibility that she needs to have systemic therapy.  Again, she is 81 years old.  She wants quality of life.  We will try to avoid chemotherapy.  Then the options would be immunotherapy with pembrolizumab.  We may also think about using Kadcyla or possibly Enhertu.  Again, she really wants her quality of life.  She wants to be able to do what she would like to do.  We will see about the CT scan.  We will get this done next week.  I will have to speak with radiation oncology.  She will get her Delton See today.  I would like to see her back probably in about a month or so.  I would think that by then, she will have finished the radiation then we can decide as to how we can try to treat her systemically.   Volanda Napoleon, MD 8/31/20233:15 PM

## 2021-10-31 ENCOUNTER — Encounter: Payer: Self-pay | Admitting: *Deleted

## 2021-10-31 DIAGNOSIS — C7951 Secondary malignant neoplasm of bone: Secondary | ICD-10-CM

## 2021-10-31 DIAGNOSIS — C50511 Malignant neoplasm of lower-outer quadrant of right female breast: Secondary | ICD-10-CM

## 2021-10-31 LAB — CANCER ANTIGEN 27.29: CA 27.29: 13.2 U/mL (ref 0.0–38.6)

## 2021-10-31 NOTE — Progress Notes (Signed)
Patient had recent PET which showed a new area of activity in the bone. Dr Marin Olp would like her seen by radiation oncology for possible treatment. He also needs a CT.  Referral order placed. CT already scheduled for 11/04/2021.  Due to rapid presentation of new lesion, systemic therapy will be discussed at next appointment.   Oncology Nurse Navigator Documentation     10/31/2021   10:30 AM  Oncology Nurse Navigator Flowsheets  Navigator Follow Up Date: 11/04/2021  Navigator Follow Up Reason: Scan Review  Navigator Location [facility]  Navigator Encounter Type Appt/Treatment Plan Review  Patient Visit Type MedOnc  Treatment Phase Active Tx  Barriers/Navigation Needs Coordination of Care;Employed  Interventions Referrals  Acuity Level 2-Minimal Needs (1-2 Barriers Identified)  Referrals Radiation Oncology  Support Groups/Services Friends and Family  Time Spent with Patient 15

## 2021-10-31 NOTE — Progress Notes (Incomplete)
Sites of Visceral and Bony Metastatic Disease:  PET Scan 10/24/2021 --IMPRESSION: PET-CT findings suggest a good response to radiation involving the L5 metastatic focus. New hypermetabolic lesion in the left acetabulum worrisome for new metastasis. No other definite metastatic bone lesions are identified. No PET-CT findings for locally recurrent left breast cancer, locoregional adenopathy or metastatic disease involving the chest, abdomen or pelvis.  Location(s) of Symptomatic Metastases: ***  Past/Anticipated chemotherapy by medical oncology, if any:  Under care of Dr. Burney Gauze 10/30/2021 I am really surprised by the fact that there does appear to be a new lesion in the left hip.   I think the fact that she now has a new lesion in the left hip is probably indicative of the aggressiveness of her triple negative disease. I talked to she and her husband about the possibility that she needs to have systemic therapy.  Again, she is 81 years old.   She wants quality of life.   We will try to avoid chemotherapy. Then the options would be immunotherapy with pembrolizumab.   We may also think about using Kadcyla or possibly Enhertu. We will see about the CT scan.  We will get this done next week. I will have to speak with radiation oncology. She will get her Delton See today. I would like to see her back probably in about a month or so.   I would think that by then, she will have finished the radiation then we can decide as to how we can try to treat her systemically.  Pain on a scale of 0-10 is: ***   Ambulatory status?: ***   SAFETY ISSUES: Prior radiation? Yes 07/30/2021 through 08/08/2021 Site Technique Total Dose (Gy) Dose per Fx (Gy) Completed Fx Beam Energies  Lumbar Spine: [id number] IMRT/SBRT 35/35 7 5/5 6XFFF  Ribs, Left: [id number] IMRT/SBRT 40/40 8 5/5 6XFFF   10/22/2014-11/23/2014 left breast at 42.72 Gy at 2.67 Gy per fraction x 16 fractions  the left breast boost at  12 Gy at 2 Gy per fraction x 6 fractions Pacemaker/ICD? No Possible current pregnancy? No--postmenopausal Is the patient on methotrexate? No  Current Complaints / other details:  ***

## 2021-11-04 ENCOUNTER — Encounter (HOSPITAL_BASED_OUTPATIENT_CLINIC_OR_DEPARTMENT_OTHER): Payer: Self-pay

## 2021-11-04 ENCOUNTER — Ambulatory Visit (HOSPITAL_BASED_OUTPATIENT_CLINIC_OR_DEPARTMENT_OTHER)
Admission: RE | Admit: 2021-11-04 | Discharge: 2021-11-04 | Disposition: A | Discharge status: Home / Self Care | Payer: Medicare Other | Source: Ambulatory Visit | Attending: Hematology & Oncology | Admitting: Hematology & Oncology

## 2021-11-04 DIAGNOSIS — M1612 Unilateral primary osteoarthritis, left hip: Secondary | ICD-10-CM | POA: Diagnosis not present

## 2021-11-04 DIAGNOSIS — C7951 Secondary malignant neoplasm of bone: Secondary | ICD-10-CM | POA: Diagnosis not present

## 2021-11-04 DIAGNOSIS — Z853 Personal history of malignant neoplasm of breast: Secondary | ICD-10-CM | POA: Diagnosis not present

## 2021-11-04 DIAGNOSIS — G8929 Other chronic pain: Secondary | ICD-10-CM | POA: Diagnosis not present

## 2021-11-04 DIAGNOSIS — M898X8 Other specified disorders of bone, other site: Secondary | ICD-10-CM | POA: Diagnosis not present

## 2021-11-04 MED ORDER — IOHEXOL 300 MG/ML  SOLN
100.0000 mL | Freq: Once | INTRAMUSCULAR | Status: AC | PRN
  Administered 2021-11-04: 56 mL via INTRAVENOUS

## 2021-11-04 NOTE — Progress Notes (Signed)
Radiation Oncology         [phone] ________________________________  Outpatient Re-Consultation  Name: Patricia [name] MRN: [id number]  Date: 11/05/2021  DOB: Apr 08, 1940  SF:KCLEXN, Herb Grays, PA-C  Ennever, Rudell Cobb, MD   REFERRING PHYSICIAN: Volanda Napoleon, MD  DIAGNOSIS:    ICD-10-CM   1. 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   Cancer Staging  Malignant neoplasm of upper-outer quadrant of left breast in female, estrogen receptor positive ([place]) Staging form: Breast, AJCC 7th Edition - Clinical: Stage IIA (T2, N0, cM0) - Unsigned - Pathologic stage from 06/28/2014: Stage IA (yT1c, N0, cM0) - Signed by Enid Cutter, MD on 07/12/2014 - Pathologic stage from 07/04/2021: Stage IV (rT1b, N0, M1) - Signed by Volanda Napoleon, MD on 07/04/2021  Interval Since Last Radiation Treatment: 2 months and 28 days   2) Intent: Palliative Radiation Treatment Dates: 07/30/2021 through 08/08/2021 Site Technique Total Dose (Gy) Dose per Fx (Gy) Completed Fx Beam Energies  Lumbar Spine: [id number]_S1 IMRT/SBRT 35/35 7 5/5 6XFFF  Ribs, Left: [id number]_[id number] IMRT/SBRT 40/40 8 5/5 6XFFF   1) Radiation treatment dates: 10/22/2014-11/23/2014 Site/dose:   Left breast/ 42.72 Gy at 2.67 Gy per fraction x 16 fractions.  Left breast boost/ 12 Gy at 2 Gy per fraction x 6 fractions Beams/energy:  Opposed tangents with 3D breath hold with 6/10 MV photons Enface/ 15 MeV  CHIEF COMPLAINT: Here to discuss management of osseous metastatic disease.   HISTORY OF PRESENT ILLNESS::Patricia [name] is a 81 y.o. female who presents today for consideration of radiation therapy in management of a new left hip (acetabulum) lesion. I last met with the patient on 09/19/21 for follow-up after completing radiation to her lumbar spine and left rib on 08/08/2021.   In the interval since her last visit, the patient presented for a restaging PET scan on  10/24/21 which surprisingly revealed a new hypermetabolic lesion in the left acetabulum (SUV max of 3.54), worrisome for a new bony metastatic lesion. PET otherwise showed findings suggestive of a good response to radiation involving the L5 metastatic focus, and no other evidence concerning for additional metastatic disease involving the chest, abdomen or pelvis, or recurrent left breast cancer.   Accordingly, the patient followed up with Dr. Marin Olp on 10/30/21 to discuss this finding. During this visit, the patient endorsed some left hip pain but denied any other symptoms or concerns. In terms of treatment options, Dr. Marin Olp discussed the role of radiation therapy to the new lesion and systemic therapy. Given the patient's age and desire to preserve her quality of life, she would like to avoid chemotherapy if at all possible. Given so, Dr. Marin Olp discussed possible immunotherapy consisting of pembrolizumab along with Kadcyla or possibly Enhertu. Following completion of radiation, the patient will return to Dr. Marin Olp to make a final decision regarding systemic treatment.   CT of the left hip performed yesterday (11/04/21) redemonstrated the small sclerotic lesion in the left superior acetabulum (corresponding with the area of hypermetabolic activity on PET-CT), as consistent with a small metastasis, measuring approximately 1.4 x 1.0 x 1.2 cm. CT otherwise showed no lytic lesions or pathologic fractures.   Of note: the patient received her most recent Xgeva injection on 10/30/21.  I personally reviewed her images.  Pain on a scale of 0-10 is: Reports discomfort is more prevalent in the left thigh while she's sitting than with movement.  States her left leg will frequently feel like it goes numb, and she will have to adjust or stand (this is similar to previous symptoms.  This seems to be in a dermatomal distribution related to her known lesion in her lower spine)  Ambulatory status?:  Still able to  ambulate without assistive device  PAST MEDICAL HISTORY:  has a past medical history of Allergic rhinitis, cause unspecified, Aortic regurgitation (06/17/2015), Arthritis, Cancer ([place]), Chronic atrial fibrillation ([place]), Chronic cough, Chronic diastolic heart failure ([place]) (06/17/2015), CKD (chronic kidney disease), stage III ([place]), Cough variant asthma, Disease of pharynx or nasopharynx, Diverticulosis, Enlargement of right atrium (01/23/2020), Essential hypertension, GERD (gastroesophageal reflux disease), Glaucoma, Hiatal hernia, Hyperlipidemia, Internal hemorrhoids, Laryngospasm, Mitral regurgitation, Multinodular goiter, Osteopenia, Postmenopausal, Pulmonary HTN ([place]), Radiation (10/22/14-11/23/14), and Stricture and stenosis of cervix.    PAST SURGICAL HISTORY: Past Surgical History:  Procedure Laterality Date   BREAST LUMPECTOMY Left    BREAST SURGERY     CARDIOVERSION N/A 06/24/2015   Procedure: CARDIOVERSION;  Surgeon: Sueanne Margarita, MD;  Location: [facility];  Service: Cardiovascular;  Laterality: N/A;   CARDIOVERSION N/A 05/19/2016   Procedure: CARDIOVERSION;  Surgeon: Sueanne Margarita, MD;  Location: [facility];  Service: Cardiovascular;  Laterality: N/A;   COLONOSCOPY     LUNG CANCER SURGERY  2003   resection carcinoid lingula-lt upper lobe   RADIOACTIVE SEED GUIDED PARTIAL MASTECTOMY WITH AXILLARY SENTINEL LYMPH NODE BIOPSY Left 06/26/2014   Procedure: RADIOACTIVE SEED GUIDED PARTIAL MASTECTOMY WITH AXILLARY SENTINEL LYMPH NODE BIOPSY;  Surgeon: Stark Klein, MD;  Location: [facility];  Service: General;  Laterality: Left;   RE-EXCISION OF BREAST LUMPECTOMY Left 08/07/2014   Procedure: RE-EXCISION OF LEFT BREAST LUMPECTOMY;  Surgeon: Stark Klein, MD;  Location: [facility];  Service: General;  Laterality: Left;   RIGHT HEART CATH N/A 04/27/2016   Procedure: Right Heart Cath;  Surgeon: Larey Dresser, MD;  Location: [facility];  Service:  Cardiovascular;  Laterality: N/A;   TEE WITHOUT CARDIOVERSION N/A 01/07/2021   Procedure: TRANSESOPHAGEAL ECHOCARDIOGRAM (TEE);  Surgeon: Acie Fredrickson Wonda Cheng, MD;  Location: [facility];  Service: Cardiovascular;  Laterality: N/A;    FAMILY HISTORY: family history includes Atrial fibrillation in her son; Hyperlipidemia in her father and mother; Hypertension in her father and mother; Stroke in her father and mother; Transient ischemic attack in her father.  SOCIAL HISTORY:  reports that she quit smoking about 55 years ago. Her smoking use included cigarettes. She has a 3.75 pack-year smoking history. She has been exposed to tobacco smoke. She has never used smokeless tobacco. She reports that she does not currently use alcohol. She reports that she does not use drugs.  ALLERGIES: Combigan [brimonidine tartrate-timolol], Other, Sulfa antibiotics, and Sulfamethoxazole-trimethoprim  MEDICATIONS:  Current Outpatient Medications  Medication Sig Dispense Refill   ADVAIR HFA 230-21 MCG/ACT inhaler USE 2 INHALATIONS TWICE A DAY 36 g 3   albuterol (PROVENTIL HFA;VENTOLIN HFA) 108 (90 Base) MCG/ACT inhaler Inhale 2 puffs into the lungs every 6 (six) hours as needed for wheezing or shortness of breath.     apixaban (ELIQUIS) 5 MG TABS tablet TAKE 1 TABLET TWICE A DAY 180 tablet 3   Apoaequorin (PREVAGEN) 10 MG CAPS Take 1 capsule by mouth every morning. 90 capsule 3   ARMOUR THYROID 15 MG tablet TAKE 1 TABLET DAILY ALONG WITH 60 MG 90 tablet 1   ARMOUR THYROID 60 MG tablet TAKE 1 TABLET DAILY BEFORE BREAKFAST 90 tablet  0   atorvastatin (LIPITOR) 40 MG tablet TAKE 1 TABLET DAILY (Patient taking differently: Take 40 mg by mouth daily before supper.) 90 tablet 3   bimatoprost (LUMIGAN) 0.01 % SOLN Place 1 drop into both eyes 2 (two) times daily.     calcium-vitamin D (OSCAL WITH D) 500-200 MG-UNIT tablet Take 1 tablet by mouth daily with breakfast. 30 tablet 0   diltiazem (CARDIZEM CD) 300 MG 24 hr capsule TAKE  1 CAPSULE DAILY 90 capsule 2   dorzolamide-timolol (COSOPT) 22.3-6.8 MG/ML ophthalmic solution Place 1 drop into both eyes at bedtime.     fluticasone (FLONASE) 50 MCG/ACT nasal spray 1 spray each nostril following sinus rinses twice daily 16 g 2   furosemide (LASIX) 40 MG tablet Take 1 tablet (40 mg total) by mouth 2 (two) times daily. 180 tablet 3   gabapentin (NEURONTIN) 400 MG capsule TAKE 1 CAPSULE TWICE A DAY 180 capsule 3   L-Methylfolate-B12-B6-B2 (CEREFOLIN) 07-31-48-5 MG TABS TAKE 1 TABLET BY MOUTH EVERY MORNING 90 tablet 3   levETIRAcetam (KEPPRA XR) 500 MG 24 hr tablet Take 1 tablet (500 mg total) by mouth at bedtime. 90 tablet 3   loratadine (CLARITIN) 10 MG tablet Take 10 mg by mouth daily.     memantine (NAMENDA) 10 MG tablet Take 1 tablet (10 mg total) by mouth 2 (two) times daily. 180 tablet 3   metoprolol succinate (TOPROL-XL) 50 MG 24 hr tablet Take 1.5 tablets (75 mg total) by mouth daily. Take with or immediately following a meal. 135 tablet 2   Multiple Vitamins-Iron (MULTIVITAMIN/IRON) TABS Take 1 tablet by mouth daily.     Netarsudil Dimesylate (RHOPRESSA) 0.02 % SOLN Place 1 drop into both eyes at bedtime.     pantoprazole (PROTONIX) 40 MG tablet TAKE 1 TABLET DAILY 90 tablet 3   potassium chloride SA (KLOR-CON M) 20 MEQ tablet TAKE 1 TABLET TWICE A DAY 180 tablet 3   traMADol (ULTRAM) 50 MG tablet Take 1 tablet (50 mg total) by mouth every 6 (six) hours as needed. 60 tablet 0   Vitamin D, Ergocalciferol, (DRISDOL) 1.25 MG (50000 UNIT) CAPS capsule TAKE 1 CAPSULE WEEKLY 12 capsule 3   No current facility-administered medications for this encounter.    REVIEW OF SYSTEMS:  Notable for that above.   PHYSICAL EXAM:  height is 5' (1.524 m) and weight is 149 lb 9.6 oz (67.9 kg). Her temperature is 98 F (36.7 C). Her blood pressure is 114/77 and her pulse is 87. Her respiration is 20 and oxygen saturation is 94%.   General: Alert and in no acute distress  HEENT: Head is  normocephalic. Extraocular movements are intact.  Musculoskeletal: symmetric strength and muscle tone throughout.  Ambulatory Neurologic: Cranial nerves II through XII are grossly intact. No obvious focalities. Speech is fluent. Coordination is intact. Psychiatric: Judgment and insight are intact. Affect is appropriate.   ECOG = 1  0 - Asymptomatic (Fully active, able to carry on all predisease activities without restriction)  1 - Symptomatic but completely ambulatory (Restricted in physically strenuous activity but ambulatory and able to carry out work of a light or sedentary nature. For example, light housework, office work)  2 - Symptomatic, <50% in bed during the day (Ambulatory and capable of all self care but unable to carry out any work activities. Up and about more than 50% of waking hours)  3 - Symptomatic, >50% in bed, but not bedbound (Capable of only limited self-care, confined to bed or  chair 50% or more of waking hours)  4 - Bedbound (Completely disabled. Cannot carry on any self-care. Totally confined to bed or chair)  5 - Death   Eustace Pen MM, Creech RH, Tormey DC, et al. [phone]). "Toxicity and response criteria of the [facility]". [place] Oncol. 5 (6): 649-55   LABORATORY DATA:  Lab Results  Component Value Date   WBC 4.7 10/30/2021   HGB 13.9 10/30/2021   HCT 42.1 10/30/2021   MCV 100.5 (H) 10/30/2021   PLT 204 10/30/2021   CMP     Component Value Date/Time   NA 141 10/30/2021 1449   NA 143 10/09/2021 1056   NA 141 09/22/2016 1459   K 4.2 10/30/2021 1449   K 3.8 09/22/2016 1459   CL 105 10/30/2021 1449   CO2 28 10/30/2021 1449   CO2 26 09/22/2016 1459   GLUCOSE 142 (H) 10/30/2021 1449   GLUCOSE 116 09/22/2016 1459   GLUCOSE 88 01/13/2006 1012   BUN 23 10/30/2021 1449   BUN 17 10/09/2021 1056   BUN 19.8 09/22/2016 1459   CREATININE 1.23 (H) 10/30/2021 1449   CREATININE 1.1 09/22/2016 1459   CALCIUM 9.6 10/30/2021 1449    CALCIUM 9.2 09/22/2016 1459   PROT 7.0 10/30/2021 1449   PROT 6.5 10/09/2021 1056   PROT 7.1 09/22/2016 1459   ALBUMIN 4.1 10/30/2021 1449   ALBUMIN 4.3 10/09/2021 1056   ALBUMIN 3.8 09/22/2016 1459   AST 20 10/30/2021 1449   AST 15 09/22/2016 1459   ALT 16 10/30/2021 1449   ALT 21 09/22/2016 1459   ALKPHOS 71 10/30/2021 1449   ALKPHOS 98 09/22/2016 1459   BILITOT 0.5 10/30/2021 1449   BILITOT 0.65 09/22/2016 1459   GFRNONAA 44 (L) 10/30/2021 1449   GFRAA 61 09/28/2019 0847   GFRAA 53 (L) 04/19/2019 0801         RADIOGRAPHY: CT HIP LEFT W CONTRAST  Result Date: 11/05/2021 CLINICAL DATA:  Chronic hip pain. History of breast cancer with new left acetabular hypermetabolic activity on PET-CT. EXAM: CT OF THE LOWER LEFT EXTREMITY WITH CONTRAST TECHNIQUE: Multidetector CT imaging of the left hip was performed according to the standard protocol following intravenous contrast administration. RADIATION DOSE REDUCTION: This exam was performed according to the departmental dose-optimization program which includes automated exposure control, adjustment of the mA and/or kV according to patient size and/or use of iterative reconstruction technique. CONTRAST:  56m OMNIPAQUE IOHEXOL 300 MG/ML  SOLN COMPARISON:  PET-CT 10/24/2021 and 06/13/2021. Lumbar MRI 07/23/2021. FINDINGS: Bones/Joint/Cartilage There is an ill-defined sclerotic focus in the left superior acetabulum corresponding with the area of hypermetabolic activity on recent PET-CT which is best seen on the soft tissue windows, measuring approximately 1.4 x 1.0 x 1.2 cm. This appears new compared with the older PET-CT and is likely a small metastasis. No lytic lesion or pathologic fracture identified. Mild left hip degenerative changes without significant joint effusion. Ligaments Suboptimally assessed by CT. Muscles and Tendons The left hip periarticular muscles and tendons appear unremarkable, without fluid collection, soft tissue mass or abnormal  enhancement. Soft tissues No periarticular fluid collection. Sigmoid colon diverticulosis noted. IMPRESSION: Small sclerotic lesion in the left superior acetabulum corresponding with the area of hypermetabolic activity on PET-CT, consistent with a small metastasis. No lytic lesion or pathologic fracture. Electronically Signed   By: WRichardean SaleM.D.   On: 11/05/2021 13:57   NM PET Image Restag (PS) Skull Base To Thigh  Result Date: 10/26/2021 CLINICAL  DATA:  Subsequent treatment strategy for metastatic breast cancer. EXAM: NUCLEAR MEDICINE PET SKULL BASE TO THIGH TECHNIQUE: 7.4 mCi F-18 FDG was injected intravenously. Full-ring PET imaging was performed from the skull base to thigh after the radiotracer. CT data was obtained and used for attenuation correction and anatomic localization. Fasting blood glucose: 104 mg/dl COMPARISON:  Multiple previous imaging studies. The most recent PET-CT is 06/13/2021 FINDINGS: Mediastinal blood pool activity: SUV max 2.37 Liver activity: SUV max NA NECK: No hypermetabolic lymph nodes in the neck. Hypermetabolism noted in the right maxillary region likely due to dental disease. Incidental CT findings: None. CHEST: Postoperative changes involving the left breast. No findings for recurrent hypermetabolic tumor or axillary adenopathy. No enlarged or hypermetabolic mediastinal or hilar lymph nodes. No worrisome pulmonary nodules to suggest pulmonary metastatic disease. There is a persistent left pleural effusion. Incidental CT findings: Is stable postoperative changes involving the left lung. Underlying emphysematous changes and pulmonary scarring again noted. ABDOMEN/PELVIS: No abnormal hypermetabolic activity within the liver, pancreas, adrenal glands, or spleen. No hypermetabolic lymph nodes in the abdomen or pelvis. Incidental CT findings: Stable vascular calcifications. Stable sigmoid colon diverticulosis. Stable fat containing left adrenal gland lesion consistent with  benign adenoma. SKELETON: Much improved appearance of the metastatic lesion involving the L5 vertebral body. Mild residual FDG uptake with SUV max of 4.86 which could be related to radiation. Previous SUV max was 9.28. The lesion was quite lytic and now demonstrates sclerotic change. There is a new hypermetabolic focus in the left acetabulum with SUV max of 3.54. Difficult to see a discrete abnormality on the CT scan but findings suspicious for new bony metastatic lesion. Small focus of uptake was noted on the prior study in a left posterior rib but this is no longer identified. Uptake in the left paraspinal muscles could be related to muscle injury or muscle activity. Similar findings in the right gluteus medius muscle. Incidental CT findings: None. IMPRESSION: 1. PET-CT findings suggest a good response to radiation involving the L5 metastatic focus. 2. New hypermetabolic lesion in the left acetabulum worrisome for new metastasis. 3. No other definite metastatic bone lesions are identified. 4. No PET-CT findings for locally recurrent left breast cancer, locoregional adenopathy or metastatic disease involving the chest, abdomen or pelvis. Electronically Signed   By: Marijo Sanes M.D.   On: 10/26/2021 13:49      IMPRESSION/PLAN:Today, I talked to the patient about the findings and work-up thus far.  We discussed the patient's diagnosis of oligometastatic breast cancer and general treatment for this, highlighting the role of radiotherapy in the management.  We discussed the available radiation techniques, and focused on the details of logistics and delivery.   We discussed that her PET scan showed excellent response to previous sites of SBRT which she has a new lesion in the left acetabulum worrisome for metastasis.  I reviewed her imaging and she is a good candidate for SBRT to this lesion as well.  We discussed the risks, benefits, and side effects of radiotherapy. Side effects may include but not necessarily  be limited to: Fatigue, skin irritation, bone injury or fracture, arthritis in left hip ; no guarantees of treatment were given. A consent form was signed and placed in the patient's medical record. The patient was encouraged to ask questions that I answered to the best of my ability.    We will rule her in the near future for treatment planning.  Anticipate 5 fractions of stereotactic body radiation therapy to  the left acetabular lesion.   On date of service, in total, I spent 35 minutes on this encounter. Patient was seen in person.  Note was signed after encounter.  Minutes pertaining to date of encounter only.   __________________________________________   Eppie Gibson, MD  This document serves as a record of services personally performed by Eppie Gibson, MD. It was created on her behalf by Roney Mans, a trained medical scribe. The creation of this record is based on the scribe's personal observations and the provider's statements to them. This document has been checked and approved by the attending provider.

## 2021-11-05 ENCOUNTER — Ambulatory Visit
Admission: RE | Admit: 2021-11-05 | Discharge: 2021-11-05 | Disposition: A | Discharge status: Home / Self Care | Payer: Medicare Other | Source: Ambulatory Visit | Attending: Radiation Oncology | Admitting: Radiation Oncology

## 2021-11-05 ENCOUNTER — Encounter: Payer: Self-pay | Admitting: *Deleted

## 2021-11-05 ENCOUNTER — Other Ambulatory Visit: Payer: Self-pay

## 2021-11-05 VITALS — BP 114/77 | HR 87 | Temp 98.0°F | Resp 20 | Ht 60.0 in | Wt 149.6 lb

## 2021-11-05 DIAGNOSIS — C7951 Secondary malignant neoplasm of bone: Secondary | ICD-10-CM | POA: Diagnosis not present

## 2021-11-05 DIAGNOSIS — Z170 Estrogen receptor positive status [ER+]: Secondary | ICD-10-CM | POA: Diagnosis not present

## 2021-11-05 DIAGNOSIS — C50412 Malignant neoplasm of upper-outer quadrant of left female breast: Secondary | ICD-10-CM | POA: Insufficient documentation

## 2021-11-05 DIAGNOSIS — C7931 Secondary malignant neoplasm of brain: Secondary | ICD-10-CM | POA: Diagnosis not present

## 2021-11-05 NOTE — Progress Notes (Signed)
Results of CT scan reviewed. Patient being seen by RadOnc today for consideration of therapy to the hip lesion.   Oncology Nurse Navigator Documentation     11/05/2021    1:30 PM  Oncology Nurse Navigator Flowsheets  Phase of Treatment Radiation  Radiation Pending-Reason: Surgeon or Oncologist Initiated  Navigator Follow Up Date: 12/01/2021  Navigator Follow Up Reason: Follow-up Appointment  Navigator Location [facility]  Navigator Encounter Type Scan Review  Patient Visit Type MedOnc  Treatment Phase Active Tx  Barriers/Navigation Needs Coordination of Care;Employed  Interventions None Required  Acuity Level 2-Minimal Needs (1-2 Barriers Identified)  Support Groups/Services Friends and Family  Time Spent with Patient 15

## 2021-11-07 DIAGNOSIS — Z6829 Body mass index (BMI) 29.0-29.9, adult: Secondary | ICD-10-CM | POA: Diagnosis not present

## 2021-11-07 DIAGNOSIS — Z170 Estrogen receptor positive status [ER+]: Secondary | ICD-10-CM | POA: Diagnosis not present

## 2021-11-07 DIAGNOSIS — C7951 Secondary malignant neoplasm of bone: Secondary | ICD-10-CM | POA: Diagnosis not present

## 2021-11-07 DIAGNOSIS — C50412 Malignant neoplasm of upper-outer quadrant of left female breast: Secondary | ICD-10-CM | POA: Diagnosis not present

## 2021-11-07 DIAGNOSIS — C799 Secondary malignant neoplasm of unspecified site: Secondary | ICD-10-CM | POA: Diagnosis not present

## 2021-11-10 ENCOUNTER — Telehealth: Payer: Self-pay

## 2021-11-10 ENCOUNTER — Encounter: Payer: Self-pay | Admitting: Radiation Oncology

## 2021-11-10 NOTE — Telephone Encounter (Signed)
-----   Message from Volanda Napoleon, MD sent at 11/07/2021  5:03 PM EDT ----- I tried to call her but there was no answer.  Please call her to let her know that the CT scan does show that there is a cancerous lesion in the left hip.  There is no fracture.  She does not need surgery.  I will call Radiation Oncology and let them know that they need to give her radiation.  Thanks much.  Laurey Arrow

## 2021-11-10 NOTE — Telephone Encounter (Signed)
LMOM advising pt of results ok per DPR. Requested a CB with any questions.

## 2021-11-11 ENCOUNTER — Ambulatory Visit
Admission: RE | Admit: 2021-11-11 | Discharge: 2021-11-11 | Disposition: A | Discharge status: Home / Self Care | Payer: Medicare Other | Source: Ambulatory Visit | Attending: Radiation Oncology | Admitting: Radiation Oncology

## 2021-11-11 ENCOUNTER — Other Ambulatory Visit: Payer: Self-pay

## 2021-11-11 DIAGNOSIS — C50412 Malignant neoplasm of upper-outer quadrant of left female breast: Secondary | ICD-10-CM | POA: Diagnosis not present

## 2021-11-11 DIAGNOSIS — C7951 Secondary malignant neoplasm of bone: Secondary | ICD-10-CM | POA: Insufficient documentation

## 2021-11-11 DIAGNOSIS — Z170 Estrogen receptor positive status [ER+]: Secondary | ICD-10-CM | POA: Diagnosis not present

## 2021-11-12 DIAGNOSIS — H401133 Primary open-angle glaucoma, bilateral, severe stage: Secondary | ICD-10-CM | POA: Diagnosis not present

## 2021-11-18 DIAGNOSIS — C7951 Secondary malignant neoplasm of bone: Secondary | ICD-10-CM | POA: Diagnosis not present

## 2021-11-18 DIAGNOSIS — C50412 Malignant neoplasm of upper-outer quadrant of left female breast: Secondary | ICD-10-CM | POA: Diagnosis not present

## 2021-11-18 DIAGNOSIS — Z170 Estrogen receptor positive status [ER+]: Secondary | ICD-10-CM | POA: Diagnosis not present

## 2021-11-19 ENCOUNTER — Other Ambulatory Visit: Payer: Self-pay

## 2021-11-19 ENCOUNTER — Ambulatory Visit
Admission: RE | Admit: 2021-11-19 | Discharge: 2021-11-19 | Disposition: A | Discharge status: Home / Self Care | Payer: Medicare Other | Source: Ambulatory Visit | Attending: Radiation Oncology | Admitting: Radiation Oncology

## 2021-11-19 DIAGNOSIS — C7951 Secondary malignant neoplasm of bone: Secondary | ICD-10-CM | POA: Diagnosis not present

## 2021-11-19 LAB — RAD ONC ARIA SESSION SUMMARY
Course Elapsed Days: 0
Plan Fractions Treated to Date: 1
Plan Prescribed Dose Per Fraction: 8 Gy
Plan Total Fractions Prescribed: 5
Plan Total Prescribed Dose: 40 Gy
Reference Point Dosage Given to Date: 8 Gy
Reference Point Session Dosage Given: 8 Gy
Session Number: 1

## 2021-11-20 ENCOUNTER — Ambulatory Visit: Payer: Medicare Other | Admitting: Radiation Oncology

## 2021-11-21 ENCOUNTER — Other Ambulatory Visit: Payer: Self-pay

## 2021-11-21 ENCOUNTER — Ambulatory Visit
Admission: RE | Admit: 2021-11-21 | Discharge: 2021-11-21 | Disposition: A | Discharge status: Home / Self Care | Payer: Medicare Other | Source: Ambulatory Visit | Attending: Radiation Oncology | Admitting: Radiation Oncology

## 2021-11-21 DIAGNOSIS — C7951 Secondary malignant neoplasm of bone: Secondary | ICD-10-CM | POA: Diagnosis not present

## 2021-11-21 LAB — RAD ONC ARIA SESSION SUMMARY
Course Elapsed Days: 2
Plan Fractions Treated to Date: 2
Plan Prescribed Dose Per Fraction: 8 Gy
Plan Total Fractions Prescribed: 5
Plan Total Prescribed Dose: 40 Gy
Reference Point Dosage Given to Date: 16 Gy
Reference Point Session Dosage Given: 8 Gy
Session Number: 2

## 2021-11-24 ENCOUNTER — Other Ambulatory Visit: Payer: Self-pay

## 2021-11-24 ENCOUNTER — Ambulatory Visit
Admission: RE | Admit: 2021-11-24 | Discharge: 2021-11-24 | Disposition: A | Discharge status: Home / Self Care | Payer: Medicare Other | Source: Ambulatory Visit | Attending: Radiation Oncology | Admitting: Radiation Oncology

## 2021-11-24 DIAGNOSIS — C7951 Secondary malignant neoplasm of bone: Secondary | ICD-10-CM | POA: Diagnosis not present

## 2021-11-24 LAB — RAD ONC ARIA SESSION SUMMARY
Course Elapsed Days: 5
Plan Fractions Treated to Date: 3
Plan Prescribed Dose Per Fraction: 8 Gy
Plan Total Fractions Prescribed: 5
Plan Total Prescribed Dose: 40 Gy
Reference Point Dosage Given to Date: 24 Gy
Reference Point Session Dosage Given: 8 Gy
Session Number: 3

## 2021-11-25 ENCOUNTER — Ambulatory Visit: Payer: Medicare Other | Admitting: Radiation Oncology

## 2021-11-25 ENCOUNTER — Other Ambulatory Visit: Payer: Self-pay | Admitting: Physician Assistant

## 2021-11-25 ENCOUNTER — Other Ambulatory Visit: Payer: Self-pay | Admitting: Cardiology

## 2021-11-25 DIAGNOSIS — R053 Chronic cough: Secondary | ICD-10-CM

## 2021-11-26 ENCOUNTER — Other Ambulatory Visit: Payer: Self-pay

## 2021-11-26 ENCOUNTER — Ambulatory Visit
Admission: RE | Admit: 2021-11-26 | Discharge: 2021-11-26 | Disposition: A | Discharge status: Home / Self Care | Payer: Medicare Other | Source: Ambulatory Visit | Attending: Radiation Oncology | Admitting: Radiation Oncology

## 2021-11-26 DIAGNOSIS — C7951 Secondary malignant neoplasm of bone: Secondary | ICD-10-CM | POA: Diagnosis not present

## 2021-11-26 LAB — RAD ONC ARIA SESSION SUMMARY
Course Elapsed Days: 7
Plan Fractions Treated to Date: 4
Plan Prescribed Dose Per Fraction: 8 Gy
Plan Total Fractions Prescribed: 5
Plan Total Prescribed Dose: 40 Gy
Reference Point Dosage Given to Date: 32 Gy
Reference Point Session Dosage Given: 8 Gy
Session Number: 4

## 2021-11-28 ENCOUNTER — Telehealth: Payer: Self-pay | Admitting: Radiation Therapy

## 2021-11-28 ENCOUNTER — Other Ambulatory Visit: Payer: Self-pay

## 2021-11-28 ENCOUNTER — Encounter: Payer: Self-pay | Admitting: Radiation Oncology

## 2021-11-28 ENCOUNTER — Ambulatory Visit
Admission: RE | Admit: 2021-11-28 | Discharge: 2021-11-28 | Disposition: A | Discharge status: Home / Self Care | Payer: Medicare Other | Source: Ambulatory Visit | Attending: Radiation Oncology | Admitting: Radiation Oncology

## 2021-11-28 DIAGNOSIS — C7951 Secondary malignant neoplasm of bone: Secondary | ICD-10-CM | POA: Diagnosis not present

## 2021-11-28 DIAGNOSIS — Z170 Estrogen receptor positive status [ER+]: Secondary | ICD-10-CM | POA: Diagnosis not present

## 2021-11-28 DIAGNOSIS — C50412 Malignant neoplasm of upper-outer quadrant of left female breast: Secondary | ICD-10-CM | POA: Diagnosis not present

## 2021-11-28 DIAGNOSIS — Z510 Encounter for antineoplastic radiation therapy: Secondary | ICD-10-CM | POA: Diagnosis not present

## 2021-11-28 LAB — RAD ONC ARIA SESSION SUMMARY
Course Elapsed Days: 9
Plan Fractions Treated to Date: 5
Plan Prescribed Dose Per Fraction: 8 Gy
Plan Total Fractions Prescribed: 5
Plan Total Prescribed Dose: 40 Gy
Reference Point Dosage Given to Date: 40 Gy
Reference Point Session Dosage Given: 8 Gy
Session Number: 5

## 2021-11-28 NOTE — Telephone Encounter (Signed)
Left a detailed message about upcoming appointment with Dr. Marcello Moores to evaluate her low back pain that is shooting down through her hip and LT buttock into her leg. Dr. Marcello Moores will see her on Wed 10/4 @ 11:15am.  Mont Dutton R.T.(R)(T) Radiation Special Procedures Navigator

## 2021-12-01 ENCOUNTER — Inpatient Hospital Stay: Payer: Medicare Other | Attending: Hematology & Oncology

## 2021-12-01 ENCOUNTER — Inpatient Hospital Stay (HOSPITAL_BASED_OUTPATIENT_CLINIC_OR_DEPARTMENT_OTHER): Payer: Medicare Other | Admitting: Hematology & Oncology

## 2021-12-01 ENCOUNTER — Encounter: Payer: Self-pay | Admitting: Hematology & Oncology

## 2021-12-01 ENCOUNTER — Other Ambulatory Visit: Payer: Self-pay

## 2021-12-01 VITALS — BP 122/72 | HR 86 | Temp 98.2°F | Resp 19 | Wt 148.0 lb

## 2021-12-01 DIAGNOSIS — C7951 Secondary malignant neoplasm of bone: Secondary | ICD-10-CM | POA: Diagnosis not present

## 2021-12-01 DIAGNOSIS — Z170 Estrogen receptor positive status [ER+]: Secondary | ICD-10-CM

## 2021-12-01 DIAGNOSIS — Z171 Estrogen receptor negative status [ER-]: Secondary | ICD-10-CM | POA: Insufficient documentation

## 2021-12-01 DIAGNOSIS — C50412 Malignant neoplasm of upper-outer quadrant of left female breast: Secondary | ICD-10-CM

## 2021-12-01 DIAGNOSIS — C50912 Malignant neoplasm of unspecified site of left female breast: Secondary | ICD-10-CM | POA: Diagnosis not present

## 2021-12-01 DIAGNOSIS — C50511 Malignant neoplasm of lower-outer quadrant of right female breast: Secondary | ICD-10-CM

## 2021-12-01 DIAGNOSIS — Z923 Personal history of irradiation: Secondary | ICD-10-CM | POA: Insufficient documentation

## 2021-12-01 LAB — CMP (CANCER CENTER ONLY)
ALT: 17 U/L (ref 0–44)
AST: 21 U/L (ref 15–41)
Albumin: 4.1 g/dL (ref 3.5–5.0)
Alkaline Phosphatase: 62 U/L (ref 38–126)
Anion gap: 8 (ref 5–15)
BUN: 23 mg/dL (ref 8–23)
CO2: 30 mmol/L (ref 22–32)
Calcium: 9.5 mg/dL (ref 8.9–10.3)
Chloride: 102 mmol/L (ref 98–111)
Creatinine: 1.21 mg/dL — ABNORMAL HIGH (ref 0.44–1.00)
GFR, Estimated: 45 mL/min — ABNORMAL LOW (ref >60)
Glucose, Bld: 120 mg/dL — ABNORMAL HIGH (ref 70–99)
Potassium: 4.3 mmol/L (ref 3.5–5.1)
Sodium: 140 mmol/L (ref 135–145)
Total Bilirubin: 0.7 mg/dL (ref 0.3–1.2)
Total Protein: 7.2 g/dL (ref 6.5–8.1)

## 2021-12-01 LAB — CBC WITH DIFFERENTIAL (CANCER CENTER ONLY)
Abs Immature Granulocytes: 0.09 10*3/uL — ABNORMAL HIGH (ref 0.00–0.07)
Basophils Absolute: 0 10*3/uL (ref 0.0–0.1)
Basophils Relative: 0 %
Eosinophils Absolute: 0 10*3/uL (ref 0.0–0.5)
Eosinophils Relative: 0 %
HCT: 43.2 % (ref 36.0–46.0)
Hemoglobin: 14.6 g/dL (ref 12.0–15.0)
Immature Granulocytes: 1 %
Lymphocytes Relative: 14 %
Lymphs Abs: 0.9 10*3/uL (ref 0.7–4.0)
MCH: 33 pg (ref 26.0–34.0)
MCHC: 33.8 g/dL (ref 30.0–36.0)
MCV: 97.5 fL (ref 80.0–100.0)
Monocytes Absolute: 0.7 10*3/uL (ref 0.1–1.0)
Monocytes Relative: 10 %
Neutro Abs: 4.7 10*3/uL (ref 1.7–7.7)
Neutrophils Relative %: 75 %
Platelet Count: 197 10*3/uL (ref 150–400)
RBC: 4.43 MIL/uL (ref 3.87–5.11)
RDW: 13 % (ref 11.5–15.5)
WBC Count: 6.4 10*3/uL (ref 4.0–10.5)
nRBC: 0 % (ref 0.0–0.2)

## 2021-12-01 LAB — LACTATE DEHYDROGENASE: LDH: 225 U/L — ABNORMAL HIGH (ref 98–192)

## 2021-12-01 NOTE — Progress Notes (Signed)
Hematology and Oncology Follow Up Visit  Patricia [name] [id number] 1941-01-23 81 y.o. 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 Vanvalkenburgh is back for follow-up.  She is doing pretty well.  She completed the SBRT on Friday.  She got through this without any problems.  She is  She patient still having some back discomfort.  This seems to be some radiation down the left leg.  I think she has an appointment with Neurosurgery this week.  Her last CA 27.29 in late August was stable at 13.2.  She has had no cough or shortness of breath.  She does have little bit of dementia.  She has had a good appetite.  She has had no nausea or vomiting.  She has had no change in bowel or bladder habits.  There is been no incontinence.  She has had no diarrhea.  There is no weakness in the legs.  She has had no rashes.  Overall, I would say her performance status is probably ECOG 1.   Medications:  Current Outpatient Medications:    ADVAIR HFA 230-21 MCG/ACT inhaler, USE 2 INHALATIONS TWICE A DAY, Disp: 36 g, Rfl: 3   albuterol (PROVENTIL HFA;VENTOLIN HFA) 108 (90 Base) MCG/ACT inhaler, Inhale 2 puffs into the lungs every 6 (six) hours as needed for wheezing or shortness of breath., Disp: , Rfl:    apixaban (ELIQUIS) 5 MG TABS tablet, TAKE 1 TABLET TWICE A DAY, Disp: 180 tablet, Rfl: 3   Apoaequorin (PREVAGEN) 10 MG CAPS, Take 1 capsule by mouth every morning., Disp: 90 capsule, Rfl: 3   ARMOUR THYROID 15 MG tablet, TAKE 1 TABLET DAILY ALONG WITH 60 MG, Disp: 90 tablet, Rfl: 1   ARMOUR THYROID 60 MG tablet, TAKE 1 TABLET DAILY BEFORE BREAKFAST, Disp: 90 tablet, Rfl: 0   atorvastatin (LIPITOR) 40 MG  tablet, Take 1 tablet (40 mg total) by mouth daily before supper., Disp: 90 tablet, Rfl: 3   bimatoprost (LUMIGAN) 0.01 % SOLN, Place 1 drop into both eyes 2 (two) times daily., Disp: , Rfl:    calcium-vitamin D (OSCAL WITH D) 500-200 MG-UNIT tablet, Take 1 tablet by mouth daily with breakfast., Disp: 30 tablet, Rfl: 0   diltiazem (CARDIZEM CD) 300 MG 24 hr capsule, TAKE 1 CAPSULE DAILY, Disp: 90 capsule, Rfl: 2   dorzolamide-timolol (COSOPT) 22.3-6.8 MG/ML ophthalmic solution, Place 1 drop into both eyes at bedtime., Disp: , Rfl:    fluticasone (FLONASE) 50 MCG/ACT nasal spray, 1 spray each nostril following sinus rinses twice daily, Disp: 16 g, Rfl: 2   furosemide (LASIX) 40 MG tablet, Take 1 tablet (40 mg total) by mouth 2 (two) times daily., Disp: 180 tablet, Rfl: 3   gabapentin (NEURONTIN) 400 MG capsule, TAKE 1 CAPSULE TWICE A DAY, Disp: 180 capsule, Rfl: 3   L-Methylfolate-B12-B6-B2 (CEREFOLIN) 07-31-48-5 MG TABS, TAKE 1 TABLET BY MOUTH EVERY MORNING, Disp: 90 tablet, Rfl: 3   levETIRAcetam (KEPPRA XR) 500 MG 24 hr tablet, Take 1 tablet (500 mg total) by mouth at bedtime., Disp: 90 tablet, Rfl: 3   loratadine (CLARITIN) 10 MG tablet, Take 10 mg by mouth daily., Disp: , Rfl:    memantine (NAMENDA) 10 MG tablet, Take  1 tablet (10 mg total) by mouth 2 (two) times daily., Disp: 180 tablet, Rfl: 3   metoprolol succinate (TOPROL-XL) 50 MG 24 hr tablet, Take 1.5 tablets (75 mg total) by mouth daily. Take with or immediately following a meal., Disp: 135 tablet, Rfl: 2   Multiple Vitamins-Iron (MULTIVITAMIN/IRON) TABS, Take 1 tablet by mouth daily., Disp: , Rfl:    Netarsudil Dimesylate (RHOPRESSA) 0.02 % SOLN, Place 1 drop into both eyes at bedtime., Disp: , Rfl:    pantoprazole (PROTONIX) 40 MG tablet, TAKE 1 TABLET DAILY, Disp: 90 tablet, Rfl: 3   potassium chloride SA (KLOR-CON M) 20 MEQ tablet, TAKE 1 TABLET TWICE A DAY, Disp: 180 tablet, Rfl: 3   traMADol (ULTRAM) 50 MG tablet, Take 1 tablet (50  mg total) by mouth every 6 (six) hours as needed., Disp: 60 tablet, Rfl: 0   Vitamin D, Ergocalciferol, (DRISDOL) 1.25 MG (50000 UNIT) CAPS capsule, TAKE 1 CAPSULE WEEKLY, Disp: 12 capsule, Rfl: 3  Allergies:  Allergies  Allergen Reactions   Combigan [Brimonidine Tartrate-Timolol] Itching    Eyes itch, reddened   Other Other (See Comments)    Per patient made OU red, Sore, and sensitivity to light   Sulfa Antibiotics Rash   Sulfamethoxazole-Trimethoprim Hives    Past Medical History, Surgical history, Social history, and Family History were reviewed and updated.  Review of Systems: Review of Systems  Constitutional: Negative.   HENT:  Negative.    Eyes: Negative.   Respiratory: Negative.    Cardiovascular: Negative.   Gastrointestinal: Negative.   Endocrine: Negative.   Genitourinary: Negative.    Musculoskeletal: Negative.   Skin: Negative.   Neurological: Negative.   Hematological: Negative.   Psychiatric/Behavioral: Negative.      Physical Exam:  weight is 148 lb (67.1 kg). Her oral temperature is 98.2 F (36.8 C). Her blood pressure is 122/72 and her pulse is 86. Her respiration is 19 and oxygen saturation is 100%.   Wt Readings from Last 3 Encounters:  12/01/21 148 lb (67.1 kg)  11/05/21 149 lb 9.6 oz (67.9 kg)  10/30/21 150 lb (68 kg)    Physical Exam Vitals reviewed.  HENT:     Head: Normocephalic and atraumatic.  Eyes:     Pupils: Pupils are equal, round, and reactive to light.  Cardiovascular:     Rate and Rhythm: Normal rate and regular rhythm.     Heart sounds: Normal heart sounds.  Pulmonary:     Effort: Pulmonary effort is normal.     Breath sounds: Normal breath sounds.  Abdominal:     General: Bowel sounds are normal.     Palpations: Abdomen is soft.  Musculoskeletal:        General: No tenderness or deformity. Normal range of motion.     Cervical back: Normal range of motion.  Lymphadenopathy:     Cervical: No cervical adenopathy.   Skin:    General: Skin is warm and dry.     Findings: No erythema or rash.  Neurological:     Mental Status: She is alert and oriented to person, place, and time.  Psychiatric:        Behavior: Behavior normal.        Thought Content: Thought content normal.        Judgment: Judgment normal.      Lab Results  Component Value Date   WBC 6.4 12/01/2021   HGB 14.6 12/01/2021   HCT 43.2 12/01/2021   MCV 97.5 12/01/2021  PLT 197 12/01/2021     Chemistry      Component Value Date/Time   NA 140 12/01/2021 0918   NA 143 10/09/2021 1056   NA 141 09/22/2016 1459   K 4.3 12/01/2021 0918   K 3.8 09/22/2016 1459   CL 102 12/01/2021 0918   CO2 30 12/01/2021 0918   CO2 26 09/22/2016 1459   BUN 23 12/01/2021 0918   BUN 17 10/09/2021 1056   BUN 19.8 09/22/2016 1459   CREATININE 1.21 (H) 12/01/2021 0918   CREATININE 1.1 09/22/2016 1459      Component Value Date/Time   CALCIUM 9.5 12/01/2021 0918   CALCIUM 9.2 09/22/2016 1459   ALKPHOS 62 12/01/2021 0918   ALKPHOS 98 09/22/2016 1459   AST 21 12/01/2021 0918   AST 15 09/22/2016 1459   ALT 17 12/01/2021 0918   ALT 21 09/22/2016 1459   BILITOT 0.7 12/01/2021 0918   BILITOT 0.65 09/22/2016 1459      Impression and Plan: Ms. Matlack is a very nice 81 year old postmenopausal white female.  She has a solitary metastatic focus from breast cancer.  Her initial breast cancer was probably 7 years ago.  It was estrogen positive.  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  The HER2 was equivocal on initial staining but on FISH was negative.  She completed her radiation therapy 3 days ago.  I would like to try to get another PET scan on her.  I would like to get one probably in December.  Some point, we will have to treat her.  I do worry that the cancer will come back.  For right now, I think we can probably hold on doing treatment.  Again she only has oligometastatic disease.  I probably would use a  anti-HER2 approach with therapy if we have to do it.  We will see about getting the PET scan done in November.  I will then plan to see her back afterwards and then we will decide as to how, or if, we do need to do treatment.     Volanda Napoleon, MD 10/2/202310:32 AM

## 2021-12-02 ENCOUNTER — Encounter: Payer: Self-pay | Admitting: *Deleted

## 2021-12-02 DIAGNOSIS — Z23 Encounter for immunization: Secondary | ICD-10-CM | POA: Diagnosis not present

## 2021-12-02 LAB — CANCER ANTIGEN 27.29: CA 27.29: 14.3 U/mL (ref 0.0–38.6)

## 2021-12-02 NOTE — Progress Notes (Signed)
Patient will need a PET prior to her next appointment. Will schedule closer to that date.   Oncology Nurse Navigator Documentation     12/02/2021    7:30 AM  Oncology Nurse Navigator Flowsheets  Navigator Follow Up Date: 12/31/2021  Navigator Follow Up Reason: Radiology  Navigator Location [facility]  Navigator Encounter Type Appt/Treatment Plan Review  Patient Visit Type MedOnc  Treatment Phase Active Tx  Barriers/Navigation Needs Coordination of Care;Employed  Interventions None Required  Acuity Level 2-Minimal Needs (1-2 Barriers Identified)  Support Groups/Services Friends and Family  Time Spent with Patient 15

## 2021-12-05 DIAGNOSIS — M5416 Radiculopathy, lumbar region: Secondary | ICD-10-CM | POA: Diagnosis not present

## 2021-12-05 DIAGNOSIS — Z6828 Body mass index (BMI) 28.0-28.9, adult: Secondary | ICD-10-CM | POA: Diagnosis not present

## 2021-12-05 DIAGNOSIS — C50919 Malignant neoplasm of unspecified site of unspecified female breast: Secondary | ICD-10-CM | POA: Diagnosis not present

## 2021-12-08 ENCOUNTER — Encounter: Payer: Self-pay | Admitting: Hematology & Oncology

## 2021-12-08 NOTE — Progress Notes (Signed)
                                                                                                                                                             Patient Name: Patricia [name] MRN: [id number] DOB: 1940-11-09 Referring Physician: Burney Gauze (Profile Not Attached) Date of Service: 11/28/2021 [name],                                                         End Of Treatment Note  Diagnoses: C79.51-Secondary malignant neoplasm of bone  Cancer Staging:  Cancer Staging  Malignant neoplasm of upper-outer quadrant of left breast in female, estrogen receptor positive ([place]) Staging form: Breast, AJCC 7th Edition - Clinical: Stage IIA (T2, N0, cM0) - Unsigned Specimen type: Core Needle Biopsy Laterality: Left Staging comments: Staged at breast conference 06/14/13  - Pathologic stage from 06/28/2014: Stage IA (yT1c, N0, cM0) - Signed by Enid Cutter, MD on 07/12/2014 Staged by: Pathologist Specimen type: Core Needle Biopsy Stage prefix: Post-therapy Laterality: Left Estrogen receptor status: Positive Progesterone receptor status: Negative HER2 status: Negative Stage used in treatment planning: Yes National guidelines used in treatment planning: Yes Type of national guideline used in treatment planning: NCCN Staging comments: Staged on final lumpectomy specimen by Dr. Donato Heinz - Pathologic stage from 07/04/2021: Stage IV (rT1b, N0, M1) - Signed by Volanda Napoleon, MD on 07/04/2021 Staged by: Managing physician Diagnostic confirmation: Positive histology Specimen type: Fine Needle Aspirate Histopathologic type: Adenocarcinoma, metastatic, NOS Stage prefix: Recurrence Laterality: Right Biopsy of metastatic site performed: Yes Source of metastatic specimen: Bone Histologic grade (G): G2 Lymph-vascular invasion (LVI): Presence of LVI unknown/indeterminate Residual tumor (R): RX - Cannot be assessed Paget's disease: Not assessed Estrogen receptor status:  Negative Progesterone receptor status: Negative HER2 status: Equivocal    Intent: Curative  Radiation Treatment Dates: 11/19/2021 through 11/28/2021 Site Technique Total Dose (Gy) Dose per Fx (Gy) Completed Fx Beam Energies  Hip, Left: Pelvis_L_acetab IMRT 40/40 8 5/5 6XFFF   Narrative: The patient tolerated radiation therapy relatively well.   Plan: The patient will follow-up with radiation oncology in 25mo . -----------------------------------  Eppie Gibson, MD

## 2021-12-10 DIAGNOSIS — M5416 Radiculopathy, lumbar region: Secondary | ICD-10-CM | POA: Diagnosis not present

## 2021-12-12 ENCOUNTER — Other Ambulatory Visit: Payer: Self-pay | Admitting: *Deleted

## 2021-12-12 MED ORDER — TRAMADOL HCL 50 MG PO TABS: 50.0000 mg | ORAL_TABLET | Freq: Four times a day (QID) | ORAL | 0 refills | Status: DC | PRN

## 2021-12-15 ENCOUNTER — Other Ambulatory Visit: Payer: Self-pay | Admitting: Cardiology

## 2021-12-16 ENCOUNTER — Other Ambulatory Visit: Payer: Self-pay | Admitting: Physician Assistant

## 2021-12-16 DIAGNOSIS — E032 Hypothyroidism due to medicaments and other exogenous substances: Secondary | ICD-10-CM

## 2021-12-18 ENCOUNTER — Encounter: Payer: Self-pay | Admitting: Physician Assistant

## 2021-12-22 DIAGNOSIS — M5416 Radiculopathy, lumbar region: Secondary | ICD-10-CM | POA: Diagnosis not present

## 2021-12-22 DIAGNOSIS — Z6829 Body mass index (BMI) 29.0-29.9, adult: Secondary | ICD-10-CM | POA: Diagnosis not present

## 2021-12-30 ENCOUNTER — Telehealth: Payer: Self-pay

## 2021-12-30 NOTE — Telephone Encounter (Signed)
..     Pre-operative Risk Assessment    Patient Name: Patricia [name]  DOB: Jun 13, 1940 MRN: [id number]      Request for Surgical Clearance    Procedure:   LUMBAR SPINE LEFT L4-L5-L5-S1  Date of Surgery:  Clearance TBD                                 Surgeon:  DR Lenord Carbo Surgeon's Group or Practice Name:  [facility] Phone number:  [phone] Fax number:  [phone]   Type of Clearance Requested:   - Medical  - Pharmacy:  Hold Apixaban (Eliquis) [place] 3 DAYS BEFORE SURGERY   Type of Anesthesia:  Not Indicated   Additional requests/questions:    [place], [place]   12/30/2021, 2:57 PM

## 2021-12-30 NOTE — Telephone Encounter (Signed)
Please comment on Eliquis hold.

## 2021-12-31 ENCOUNTER — Encounter: Payer: Self-pay | Admitting: *Deleted

## 2021-12-31 ENCOUNTER — Telehealth: Payer: Self-pay | Admitting: *Deleted

## 2021-12-31 ENCOUNTER — Other Ambulatory Visit (HOSPITAL_COMMUNITY): Payer: Self-pay

## 2021-12-31 NOTE — Telephone Encounter (Signed)
Left message for the pt to call back to schedule a tele pre op appt

## 2021-12-31 NOTE — Telephone Encounter (Signed)
Patient's spouse is calling back to schedule a tele pre op appt for patient

## 2021-12-31 NOTE — Telephone Encounter (Signed)
Patient with diagnosis of atrial fibrillation on Eliquis for anticoagulation.    Procedure: lumbar spine left L-4L5, L5-S1 Date of procedure: TBD   CHA2DS2-VASc Score = 7   This indicates a 11.2% annual risk of stroke. The patient's score is based upon: CHF History: 1 HTN History: 1 Diabetes History: 0 Stroke History: 2 Vascular Disease History: 0 Age Score: 2 Gender Score: 1    CrCl 39 Platelet count 197  Pt had kyphoplasty 4/23 and was reviewed by Dr. Radford Pax: Sueanne Margarita, MD to Ramond Dial, RPH-CPP  Cv Div Preop      06/23/21  8:02 AM She has permanent atrial fibrillation so is at increased risk of a repeat cardioembolic event if holding Eliquis for 3 days and patient needs to understand that to consent to going through procedure   Per office protocol, patient can hold Eliquis for 3 days prior to procedure.    Patient will not need bridging with Lovenox (enoxaparin) around procedure.  **This guidance is not considered finalized until pre-operative APP has relayed final recommendations.**

## 2021-12-31 NOTE — Telephone Encounter (Signed)
I s/w the [facility] and confirmed the date of her procedure for pt's procedure is 01/06/22 @ 2:45. Pt agreeable to plan of care for tele pre op appt 01/01/22 per pt's husband request @ 1:40 add on.   Med rec and consent are done.     Patient Consent for Virtual Visit        Patricia [name] has provided verbal consent on 12/31/2021 for a virtual visit (video or telephone).   CONSENT FOR VIRTUAL VISIT FOR:  Patricia [name]  By participating in this virtual visit I agree to the following:  I hereby voluntarily request, consent and authorize [facility] and its employed or contracted physicians, physician assistants, nurse practitioners or other licensed health care professionals (the Practitioner), to provide me with telemedicine health care services (the "Services") as deemed necessary by the treating Practitioner. I acknowledge and consent to receive the Services by the Practitioner via telemedicine. I understand that the telemedicine visit will involve communicating with the Practitioner through live audiovisual communication technology and the disclosure of certain medical information by electronic transmission. I acknowledge that I have been given the opportunity to request an in-person assessment or other available alternative prior to the telemedicine visit and am voluntarily participating in the telemedicine visit.  I understand that I have the right to withhold or withdraw my consent to the use of telemedicine in the course of my care at any time, without affecting my right to future care or treatment, and that the Practitioner or I may terminate the telemedicine visit at any time. I understand that I have the right to inspect all information obtained and/or recorded in the course of the telemedicine visit and may receive copies of available information for a reasonable fee.  I understand that some of the potential risks of receiving the Services via telemedicine include:  Delay or  interruption in medical evaluation due to technological equipment failure or disruption; Information transmitted may not be sufficient (e.g. poor resolution of images) to allow for appropriate medical decision making by the Practitioner; and/or  In rare instances, security protocols could fail, causing a breach of personal health information.  Furthermore, I acknowledge that it is my responsibility to provide information about my medical history, conditions and care that is complete and accurate to the best of my ability. I acknowledge that Practitioner's advice, recommendations, and/or decision may be based on factors not within their control, such as incomplete or inaccurate data provided by me or distortions of diagnostic images or specimens that may result from electronic transmissions. I understand that the practice of medicine is not an exact science and that Practitioner makes no warranties or guarantees regarding treatment outcomes. I acknowledge that a copy of this consent can be made available to me via my patient portal ([facility]), or I can request a printed copy by calling the office of [facility].    I understand that my insurance will be billed for this visit.   I have read or had this consent read to me. I understand the contents of this consent, which adequately explains the benefits and risks of the Services being provided via telemedicine.  I have been provided ample opportunity to ask questions regarding this consent and the Services and have had my questions answered to my satisfaction. I give my informed consent for the services to be provided through the use of telemedicine in my medical care

## 2021-12-31 NOTE — Telephone Encounter (Signed)
   Name: Patricia [name]  DOB: May 25, 1940  MRN: [id number]  Primary Cardiologist: Fransico Him, MD  Chart reviewed as part of pre-operative protocol coverage. Because of [name] past medical history and time since last visit, she will require a follow-up telephone visit in order to better assess preoperative cardiovascular risk.  Pre-op covering staff: - Please schedule appointment and call patient to inform them. If patient already had an upcoming appointment within acceptable timeframe, please add "pre-op clearance" to the appointment notes so provider is aware. - Please contact requesting surgeon's office via preferred method (i.e, phone, fax) to inform them of need for appointment prior to surgery.  A 3-day hold for Eliquis was requested.  Per our pharmacy team patient may hold Eliquis 3 days prior to the procedure.  Please restart medically safe to do so.  Per Dr. Radford Pax:  She has permanent atrial fibrillation so is at increased risk of a repeat cardioembolic event if holding Eliquis for 3 days and patient needs to understand that to consent to going through procedure.   Elgie Collard, PA-C  12/31/2021, 1:24 PM

## 2021-12-31 NOTE — Progress Notes (Signed)
Ms. Leland is her for follow-up of radiation to her left hip. Her last treatment as on 08-08-21.   Pain: 3/10 in left hip, this is an old pain- nerve issue, getting shot soon Mobility: limp with left hip Bowel: none at this time Bladder: no problems at this time Weakness: normal weakness Skin: no issues  Concerns: no questions or concerns at this time  Vitals:  Vitals:   01/02/22 1359  BP: 104/71  Pulse: (!) 114  Resp: 18  Temp: (!) 97.4 F (36.3 C)  SpO2: 95%

## 2021-12-31 NOTE — Progress Notes (Signed)
Patient needs a PET scan prior to her next appointment. Scheduled for 01/21/22.  Patient is aware of PET appointment including date, time, and location. The following prep is reviewed with patient and confirmed with teachback: - arrive 30 minutes before appointment time - NPO except water for 6h before scan. No candy, no gum - hold any diabetic medication the morning of the scan - have a low carb dinner the night prior Radiology Information sheet also mailed to patient's home for reinforcement of education.  Oncology Nurse Navigator Documentation     12/31/2021    1:30 PM  Oncology Nurse Navigator Flowsheets  Navigator Follow Up Date: 01/21/2022  Navigator Follow Up Reason: Scan Review  Navigator Location [facility]  Navigator Encounter Type Appt/Treatment Plan Review;Telephone  Telephone Appt Confirmation/Clarification;Education;Outgoing Call  Patient Visit Type MedOnc  Treatment Phase Active Tx  Barriers/Navigation Needs Coordination of Care;Employed  Interventions Coordination of Care;Education  Acuity Level 2-Minimal Needs (1-2 Barriers Identified)  Coordination of Care Radiology  Education Method Verbal;Written  Support Groups/Services Friends and Family  Time Spent with Patient 30

## 2022-01-01 ENCOUNTER — Ambulatory Visit: Payer: Medicare Other | Attending: Cardiology | Admitting: Nurse Practitioner

## 2022-01-01 DIAGNOSIS — Z01810 Encounter for preprocedural cardiovascular examination: Secondary | ICD-10-CM

## 2022-01-01 NOTE — Progress Notes (Signed)
Virtual Visit via Telephone Note   Because of Patricia [name] co-morbid illnesses, she is at least at moderate risk for complications without adequate follow up.  This format is felt to be most appropriate for this patient at this time.  The patient did not have access to video technology/had technical difficulties with video requiring transitioning to audio format only (telephone).  All issues noted in this document were discussed and addressed.  No physical exam could be performed with this format.  Please refer to the patient's chart for her consent to telehealth for [facility].  Evaluation Performed:  Preoperative cardiovascular risk assessment _____________   Date:  01/01/2022   Patient ID:  Patricia [name], DOB [birth year], MRN [id number] Patient Location:  Home Provider location:   Office  Primary Care Provider:  Lorrene Reid, PA-C Primary Cardiologist:  Fransico Him, MD  Chief Complaint / Patient Profile   81 y.o. y/o female with a h/o permanent atrial fibrillation, CVA, diastolic heart failure, mild aortic valve regurgitation, normal stress test in 2017, lung cancer s/p resection in 2003, breast cancer s/p lumpectomy/tamoxifen/XRT, hypertension, hyperlipidemia, and OSA who is pending Lumbar spine Left L4-L5, L5-S1 with Dr. Lenord Carbo of [place] and presents today for telephonic preoperative cardiovascular risk assessment.  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 Dx 04/2015 at time of stroke. b. DCCV 06/2015 - did not hold. Amio started then stopped due to QT prolongation.   Chronic cough    Chronic diastolic heart failure ([place]) 06/17/2015   CKD (chronic kidney disease), stage III (HCC)    Cough  variant asthma    Disease of pharynx or nasopharynx    Diverticulosis    Enlargement of right atrium 01/23/2020   Essential hypertension    GERD (gastroesophageal reflux disease)    Glaucoma    Hiatal hernia    Hyperlipidemia    Internal hemorrhoids    Laryngospasm    Mitral regurgitation    mild to moderate by echo 12/2019 and moderate by TEE 12/2020   Multinodular goiter    Osteopenia    Postmenopausal    Pulmonary HTN (HCC)     Moderate with PASP 20mmHg by echo 2018 likely Group 2 from pulmonary venous HTN from CHF>>normal PAP by echo 12/2019   Radiation 10/22/14-11/23/14   Left Breast   Stricture and stenosis of cervix    Past Surgical History:  Procedure Laterality Date   BREAST LUMPECTOMY Left    BREAST SURGERY     CARDIOVERSION N/A 06/24/2015   Procedure: CARDIOVERSION;  Surgeon: Sueanne Margarita, MD;  Location: [place];  Service: Cardiovascular;  Laterality: N/A;   CARDIOVERSION N/A 05/19/2016   Procedure: CARDIOVERSION;  Surgeon: Sueanne Margarita, MD;  [facility];  Service: Cardiovascular;  Laterality: N/A;   COLONOSCOPY     LUNG CANCER SURGERY  2003   resection carcinoid lingula-lt upper lobe   RADIOACTIVE SEED GUIDED PARTIAL MASTECTOMY WITH AXILLARY SENTINEL LYMPH NODE BIOPSY Left 06/26/2014   Procedure: RADIOACTIVE SEED GUIDED PARTIAL MASTECTOMY WITH AXILLARY SENTINEL LYMPH NODE BIOPSY;  Surgeon: Stark Klein, MD;  Location: [place];  Service: General;  Laterality: Left;  RE-EXCISION OF BREAST LUMPECTOMY Left 08/07/2014   Procedure: RE-EXCISION OF LEFT BREAST LUMPECTOMY;  Surgeon: Stark Klein, MD;  Location: [place];  Service: General;  Laterality: Left;   RIGHT HEART CATH N/A 04/27/2016   Procedure: Right Heart Cath;  Surgeon: Larey Dresser, MD;  Location: [place] CV [facility];  Service: Cardiovascular;  Laterality: N/A;   TEE WITHOUT CARDIOVERSION N/A 01/07/2021   Procedure: TRANSESOPHAGEAL ECHOCARDIOGRAM (TEE);  Surgeon:  Acie Fredrickson Wonda Cheng, MD;  Location: [facility] ENDOSCOPY;  Service: Cardiovascular;  Laterality: N/A;    Allergies  Allergies  Allergen Reactions   Combigan [Brimonidine Tartrate-Timolol] Itching    Eyes itch, reddened   Other Other (See Comments)    Per patient made OU red, Sore, and sensitivity to light   Sulfa Antibiotics Rash   Sulfamethoxazole-Trimethoprim Hives    History of Present Illness    Patricia [name] is a 81 y.o. female who presents via audio/video conferencing for a telehealth visit today.  Pt was last seen in cardiology clinic on 04/17/2021 by Dr. Radford Pax.  At that time Bobby Ragan was doing well.  She was seen virtually by Leanor Kail, PA on 06/24/2021 for preoperative cardiac evaluation and was stable.  The patient is now pending procedure as outlined above. Since her last visit, she stable from a cardiac standpoint. She denies chest pain, palpitations, dyspnea, pnd, orthopnea, n, v, dizziness, syncope, edema, weight gain, or early satiety. All other systems reviewed and are otherwise negative except as noted above.   Home Medications    Prior to Admission medications   Medication Sig Start Date End Date Taking? Authorizing Provider  ADVAIR HFA 326-71 MCG/ACT inhaler USE 2 INHALATIONS TWICE A DAY 03/27/21   Abonza, Maritza, PA-C  albuterol (PROVENTIL HFA;VENTOLIN HFA) 108 (90 Base) MCG/ACT inhaler Inhale 2 puffs into the lungs every 6 (six) hours as needed for wheezing or shortness of breath.    [provider]  apixaban (ELIQUIS) 5 MG TABS tablet TAKE 1 TABLET TWICE A DAY 07/17/21   Bhagat, [place], PA  Apoaequorin (PREVAGEN) 10 MG CAPS Take 1 capsule by mouth every morning. 11/28/20   Ronnell Freshwater, NP  ARMOUR THYROID 15 MG tablet TAKE 1 TABLET DAILY ALONG WITH 60 MG 12/16/21   Abonza, [place], PA-C  ARMOUR THYROID 60 MG tablet TAKE 1 TABLET DAILY BEFORE BREAKFAST 10/17/21   Abonza, Maritza, PA-C  atorvastatin (LIPITOR) 40 MG tablet Take 1 tablet (40 mg total) by  mouth daily before supper. 11/25/21 11/26/22  Sueanne Margarita, MD  bimatoprost (LUMIGAN) 0.01 % SOLN Place 1 drop into both eyes 2 (two) times daily.    [provider]  calcium-vitamin D (OSCAL WITH D) 500-200 MG-UNIT tablet Take 1 tablet by mouth daily with breakfast. 11/03/17   Opalski, Neoma Laming, DO  diltiazem (CARDIZEM CD) 300 MG 24 hr capsule Take 1 capsule (300 mg total) by mouth daily. Please keep scheduled appointment for future refills. Thank you. 12/15/21   Sueanne Margarita, MD  dorzolamide-timolol (COSOPT) 22.3-6.8 MG/ML ophthalmic solution Place 1 drop into both eyes at bedtime.    [provider]  fluticasone (FLONASE) 50 MCG/ACT nasal spray 1 spray each nostril following sinus rinses twice daily 02/08/18   Opalski, Neoma Laming, DO  furosemide (LASIX) 40 MG tablet Take 1 tablet (40 mg total) by mouth 2 (two) times daily. 04/30/21   Sueanne Margarita, MD  gabapentin (NEURONTIN) 400 MG capsule TAKE 1 CAPSULE TWICE A DAY 11/25/21   Lorrene Reid, PA-C  L-Methylfolate-B12-B6-B2 (CEREFOLIN) 07-31-48-5 MG TABS TAKE 1 TABLET BY MOUTH EVERY MORNING 03/24/21   Garvin Fila, MD  levETIRAcetam (KEPPRA XR) 500 MG 24 hr tablet Take 1 tablet (500 mg total) by mouth at bedtime. 08/07/21   Frann Rider, NP  loratadine (CLARITIN) 10 MG tablet Take 10 mg by mouth daily.    [provider]  memantine (NAMENDA) 10 MG tablet Take 1 tablet (10 mg total) by mouth 2 (two) times daily. 08/07/21   Frann Rider, NP  metoprolol succinate (TOPROL-XL) 50 MG 24 hr tablet Take 1.5 tablets (75 mg total) by mouth daily. Take with or immediately following a meal. 09/05/21   Turner, Eber Hong, MD  Multiple Vitamins-Iron (MULTIVITAMIN/IRON) TABS Take 1 tablet by mouth daily.    [provider]  Netarsudil Dimesylate (RHOPRESSA) 0.02 % SOLN Place 1 drop into both eyes at bedtime.    [provider]  pantoprazole (PROTONIX) 40 MG tablet TAKE 1 TABLET DAILY 03/20/21   Ronnell Freshwater, NP  potassium  chloride SA (KLOR-CON M) 20 MEQ tablet TAKE 1 TABLET TWICE A DAY 08/06/21   Turner, Eber Hong, MD  traMADol (ULTRAM) 50 MG tablet Take 1 tablet (50 mg total) by mouth every 6 (six) hours as needed. 12/12/21   Volanda Napoleon, MD  Vitamin D, Ergocalciferol, (DRISDOL) 1.25 MG (50000 UNIT) CAPS capsule TAKE 1 CAPSULE WEEKLY 10/28/21   Ronnell Freshwater, NP    Physical Exam    Vital Signs:  [name] does not have vital signs available for review today.  Given telephonic nature of communication, physical exam is limited. AAOx3. NAD. Normal affect.  Speech and respirations are unlabored.  Accessory Clinical Findings    None  Assessment & Plan    1.  Preoperative Cardiovascular Risk Assessment:  According to the Revised Cardiac Risk Index (RCRI), her Perioperative Risk of Major Cardiac Event is (%): 6.6. Her Functional Capacity in METs is: 7.99 according to the [facility] Activity Status Index (DASI). Therefore, based on ACC/AHA guidelines, patient would be at acceptable risk for the planned procedure without further cardiovascular testing.   The patient was advised that if she develops new symptoms prior to surgery to contact our office to arrange for a follow-up visit, and she verbalized understanding.  Per office protocol, patient may hold Eliquis 3 days prior to the procedure.  Please restart when medically safe to do so.  Per Dr. Radford Pax: "She has permanent atrial fibrillation so is at increased risk of a repeat cardioembolic event if holding Eliquis for 3 days and patient needs to understand that to consent to going through procedure." Patient verbalized understanding.  A copy of this note will be routed to requesting surgeon.  Time:   Today, I have spent 3 minutes with the patient with telehealth technology discussing medical history, symptoms, and management plan.     Lenna Sciara, NP  01/01/2022, 1:50 PM

## 2022-01-02 ENCOUNTER — Ambulatory Visit
Admission: RE | Admit: 2022-01-02 | Discharge: 2022-01-02 | Disposition: A | Discharge status: Home / Self Care | Payer: Medicare Other | Source: Ambulatory Visit | Attending: Radiation Oncology | Admitting: Radiation Oncology

## 2022-01-02 ENCOUNTER — Encounter: Payer: Self-pay | Admitting: Radiation Oncology

## 2022-01-02 VITALS — BP 104/71 | HR 114 | Temp 97.4°F | Resp 18 | Ht 60.0 in | Wt 149.5 lb

## 2022-01-02 DIAGNOSIS — I4891 Unspecified atrial fibrillation: Secondary | ICD-10-CM | POA: Insufficient documentation

## 2022-01-02 DIAGNOSIS — I482 Chronic atrial fibrillation, unspecified: Secondary | ICD-10-CM | POA: Insufficient documentation

## 2022-01-02 DIAGNOSIS — C50412 Malignant neoplasm of upper-outer quadrant of left female breast: Secondary | ICD-10-CM | POA: Diagnosis not present

## 2022-01-02 DIAGNOSIS — K219 Gastro-esophageal reflux disease without esophagitis: Secondary | ICD-10-CM | POA: Diagnosis not present

## 2022-01-02 DIAGNOSIS — C7951 Secondary malignant neoplasm of bone: Secondary | ICD-10-CM | POA: Insufficient documentation

## 2022-01-02 DIAGNOSIS — I272 Pulmonary hypertension, unspecified: Secondary | ICD-10-CM | POA: Insufficient documentation

## 2022-01-02 DIAGNOSIS — H409 Unspecified glaucoma: Secondary | ICD-10-CM | POA: Diagnosis not present

## 2022-01-02 DIAGNOSIS — N183 Chronic kidney disease, stage 3 unspecified: Secondary | ICD-10-CM | POA: Diagnosis not present

## 2022-01-02 DIAGNOSIS — Z923 Personal history of irradiation: Secondary | ICD-10-CM | POA: Diagnosis not present

## 2022-01-02 DIAGNOSIS — I5032 Chronic diastolic (congestive) heart failure: Secondary | ICD-10-CM | POA: Insufficient documentation

## 2022-01-02 DIAGNOSIS — Z87891 Personal history of nicotine dependence: Secondary | ICD-10-CM | POA: Insufficient documentation

## 2022-01-02 DIAGNOSIS — M8580 Other specified disorders of bone density and structure, unspecified site: Secondary | ICD-10-CM | POA: Diagnosis not present

## 2022-01-02 DIAGNOSIS — Z79890 Hormone replacement therapy (postmenopausal): Secondary | ICD-10-CM | POA: Diagnosis not present

## 2022-01-02 DIAGNOSIS — Z7901 Long term (current) use of anticoagulants: Secondary | ICD-10-CM | POA: Insufficient documentation

## 2022-01-02 DIAGNOSIS — E042 Nontoxic multinodular goiter: Secondary | ICD-10-CM | POA: Insufficient documentation

## 2022-01-02 DIAGNOSIS — I130 Hypertensive heart and chronic kidney disease with heart failure and stage 1 through stage 4 chronic kidney disease, or unspecified chronic kidney disease: Secondary | ICD-10-CM | POA: Diagnosis not present

## 2022-01-02 DIAGNOSIS — Z79899 Other long term (current) drug therapy: Secondary | ICD-10-CM | POA: Diagnosis not present

## 2022-01-02 DIAGNOSIS — E785 Hyperlipidemia, unspecified: Secondary | ICD-10-CM | POA: Diagnosis not present

## 2022-01-02 DIAGNOSIS — Z8719 Personal history of other diseases of the digestive system: Secondary | ICD-10-CM | POA: Insufficient documentation

## 2022-01-02 NOTE — Progress Notes (Signed)
Radiation Oncology         [phone] ________________________________  Outpatient Re-Consultation  Name: Patricia [name] MRN: [id number]  Date: 01/02/2022  DOB: 06-27-1940  [id number], Herb Grays, PA-C  Ennever, Rudell Cobb, MD   REFERRING PHYSICIAN: Volanda Napoleon, MD  DIAGNOSIS:    ICD-10-CM   1. 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   Cancer Staging  Malignant neoplasm of upper-outer quadrant of left breast in female, estrogen receptor positive ([place]) Staging form: Breast, AJCC 7th Edition - Clinical: Stage IIA (T2, N0, cM0) - Unsigned - Pathologic stage from 06/28/2014: Stage IA (yT1c, N0, cM0) - Signed by Enid Cutter, MD on 07/12/2014 - Pathologic stage from 07/04/2021: Stage IV (rT1b, N0, M1) - Signed by Volanda Napoleon, MD on 07/04/2021  Interval Since Last Radiation Treatment: 2 months and 28 days  3)Radiation Treatment Dates: 11/19/2021 through 11/28/2021 Site Technique Total Dose (Gy) Dose per Fx (Gy) Completed Fx Beam Energies  Hip, Left: Pelvis_L_acetab IMRT 40/40 8 5/5 6XFFF    2)Radiation Treatment Dates: 07/30/2021 through 08/08/2021 Site Technique Total Dose (Gy) Dose per Fx (Gy) Completed Fx Beam Energies  Lumbar Spine: Spine_L5_S1 IMRT/SBRT 35/35 7 5/5 6XFFF  Ribs, Left: Chest_L_9thRib IMRT/SBRT 40/40 8 5/5 6XFFF   1)Radiation treatment dates: 10/22/2014-11/23/2014 Site/dose:   Left breast/ 42.72 Gy at 2.67 Gy per fraction x 16 fractions.  Left breast boost/ 12 Gy at 2 Gy per fraction x 6 fractions Beams/energy:  Opposed tangents with 3D breath hold with 6/10 MV photons Enface/ 15 MeV    NARRATIVE: Patricia [name] is her for follow-up of radiation to her left hip.    Pain: 3/10 in left hip, this is an old pain- nerve issue, getting an injection soon to help with this at the neurosurgical clinic Mobility: limp with left hip Bowel: none at this time Bladder: no problems at this time Weakness: normal weakness Skin: no  issues  Concerns: no questions or concerns at this time  Vitals:  Vitals:   01/02/22 1359  BP: 104/71  Pulse: (!) 114  Resp: 18  Temp: (!) 97.4 F (36.3 C)  SpO2: 95%    PAST MEDICAL HISTORY:  has a past medical history of Allergic rhinitis, cause unspecified, Aortic regurgitation (06/17/2015), Arthritis, Cancer ([place]), Chronic atrial fibrillation ([place]), Chronic cough, Chronic diastolic heart failure ([place]) (06/17/2015), CKD (chronic kidney disease), stage III ([place]), Cough variant asthma, Disease of pharynx or nasopharynx, Diverticulosis, Enlargement of right atrium (01/23/2020), Essential hypertension, GERD (gastroesophageal reflux disease), Glaucoma, Hiatal hernia, Hyperlipidemia, Internal hemorrhoids, Laryngospasm, Mitral regurgitation, Multinodular goiter, Osteopenia, Postmenopausal, Pulmonary HTN ([place]), Radiation (10/22/14-11/23/14), and Stricture and stenosis of cervix.    PAST SURGICAL HISTORY: Past Surgical History:  Procedure Laterality Date   BREAST LUMPECTOMY Left    BREAST SURGERY     CARDIOVERSION N/A 06/24/2015   Procedure: CARDIOVERSION;  Surgeon: Sueanne Margarita, MD;  Location: [place];  Service: Cardiovascular;  Laterality: N/A;   CARDIOVERSION N/A 05/19/2016   Procedure: CARDIOVERSION;  Surgeon: Sueanne Margarita, MD;  Location: [facility];  Service: Cardiovascular;  Laterality: N/A;   COLONOSCOPY     LUNG CANCER SURGERY  2003   resection carcinoid lingula-lt upper lobe   RADIOACTIVE SEED GUIDED PARTIAL MASTECTOMY WITH AXILLARY SENTINEL LYMPH NODE BIOPSY Left 06/26/2014   Procedure: RADIOACTIVE SEED GUIDED PARTIAL MASTECTOMY WITH AXILLARY SENTINEL LYMPH NODE BIOPSY;  Surgeon: Stark Klein, MD;  Location: [place];  Service: General;  Laterality:  Left;   RE-EXCISION OF BREAST LUMPECTOMY Left 08/07/2014   Procedure: RE-EXCISION OF LEFT BREAST LUMPECTOMY;  Surgeon: Stark Klein, MD;  Location: [place];  Service: General;  Laterality: Left;    RIGHT HEART CATH N/A 04/27/2016   Procedure: Right Heart Cath;  Surgeon: Larey Dresser, MD;  Location: [place] [facility];  Service: Cardiovascular;  Laterality: N/A;   TEE WITHOUT CARDIOVERSION N/A 01/07/2021   Procedure: TRANSESOPHAGEAL ECHOCARDIOGRAM (TEE);  Surgeon: Acie Fredrickson Wonda Cheng, MD;  Location: [facility] ENDOSCOPY;  Service: Cardiovascular;  Laterality: N/A;    FAMILY HISTORY: family history includes Atrial fibrillation in her son; Hyperlipidemia in her father and mother; Hypertension in her father and mother; Stroke in her father and mother; Transient ischemic attack in her father.  SOCIAL HISTORY:  reports that she quit smoking about 55 years ago. Her smoking use included cigarettes. She has a 3.75 pack-year smoking history. She has been exposed to tobacco smoke. She has never used smokeless tobacco. She reports that she does not currently use alcohol. She reports that she does not use drugs.  ALLERGIES: Combigan [brimonidine tartrate-timolol], Other, Sulfa antibiotics, and Sulfamethoxazole-trimethoprim  MEDICATIONS:  Current Outpatient Medications  Medication Sig Dispense Refill   ADVAIR HFA 230-21 MCG/ACT inhaler USE 2 INHALATIONS TWICE A Patricia 36 g 3   albuterol (PROVENTIL HFA;VENTOLIN HFA) 108 (90 Base) MCG/ACT inhaler Inhale 2 puffs into the lungs every 6 (six) hours as needed for wheezing or shortness of breath.     apixaban (ELIQUIS) 5 MG TABS tablet TAKE 1 TABLET TWICE A Patricia 180 tablet 3   Apoaequorin (PREVAGEN) 10 MG CAPS Take 1 capsule by mouth every morning. 90 capsule 3   ARMOUR THYROID 15 MG tablet TAKE 1 TABLET DAILY ALONG WITH 60 MG 90 tablet 3   ARMOUR THYROID 60 MG tablet TAKE 1 TABLET DAILY BEFORE BREAKFAST 90 tablet 0   atorvastatin (LIPITOR) 40 MG tablet Take 1 tablet (40 mg total) by mouth daily before supper. 90 tablet 3   bimatoprost (LUMIGAN) 0.01 % SOLN Place 1 drop into both eyes 2 (two) times daily.     calcium-vitamin D (OSCAL WITH D) 500-200 MG-UNIT tablet Take  1 tablet by mouth daily with breakfast. 30 tablet 0   diltiazem (CARDIZEM CD) 300 MG 24 hr capsule Take 1 capsule (300 mg total) by mouth daily. Please keep scheduled appointment for future refills. Thank you. 90 capsule 0   dorzolamide-timolol (COSOPT) 22.3-6.8 MG/ML ophthalmic solution Place 1 drop into both eyes at bedtime.     fluticasone (FLONASE) 50 MCG/ACT nasal spray 1 spray each nostril following sinus rinses twice daily 16 g 2   furosemide (LASIX) 40 MG tablet Take 1 tablet (40 mg total) by mouth 2 (two) times daily. 180 tablet 3   gabapentin (NEURONTIN) 400 MG capsule TAKE 1 CAPSULE TWICE A Patricia 180 capsule 3   L-Methylfolate-B12-B6-B2 (CEREFOLIN) 07-31-48-5 MG TABS TAKE 1 TABLET BY MOUTH EVERY MORNING 90 tablet 3   levETIRAcetam (KEPPRA XR) 500 MG 24 hr tablet Take 1 tablet (500 mg total) by mouth at bedtime. 90 tablet 3   loratadine (CLARITIN) 10 MG tablet Take 10 mg by mouth daily.     memantine (NAMENDA) 10 MG tablet Take 1 tablet (10 mg total) by mouth 2 (two) times daily. 180 tablet 3   metoprolol succinate (TOPROL-XL) 50 MG 24 hr tablet Take 1.5 tablets (75 mg total) by mouth daily. Take with or immediately following a meal. 135 tablet 2  Multiple Vitamins-Iron (MULTIVITAMIN/IRON) TABS Take 1 tablet by mouth daily.     Netarsudil Dimesylate (RHOPRESSA) 0.02 % SOLN Place 1 drop into both eyes at bedtime.     pantoprazole (PROTONIX) 40 MG tablet TAKE 1 TABLET DAILY 90 tablet 3   potassium chloride SA (KLOR-CON M) 20 MEQ tablet TAKE 1 TABLET TWICE A Patricia 180 tablet 3   traMADol (ULTRAM) 50 MG tablet Take 1 tablet (50 mg total) by mouth every 6 (six) hours as needed. 60 tablet 0   Vitamin D, Ergocalciferol, (DRISDOL) 1.25 MG (50000 UNIT) CAPS capsule TAKE 1 CAPSULE WEEKLY 12 capsule 3   No current facility-administered medications for this encounter.    REVIEW OF SYSTEMS:  Notable for that above.   PHYSICAL EXAM:  height is 5' (1.524 m) and weight is 149 lb 8 oz (67.8 kg). Her oral  temperature is 97.4 F (36.3 C) (abnormal). Her blood pressure is 104/71 and her pulse is 114 (abnormal). Her respiration is 18 and oxygen saturation is 95%.   General: Alert and in no acute distress  HEENT: Head is normocephalic. Extraocular movements are intact.  Musculoskeletal: Well-nourished.  Ambulatory Neurologic: Cranial nerves II through XII are grossly intact. No obvious focalities. Speech is fluent. Coordination is intact. Psychiatric: Judgment and insight are intact. Affect is appropriate. Skin: No residual irritation in the left hip or groin area corresponding to radiation fields  ECOG = 1  0 - Asymptomatic (Fully active, able to carry on all predisease activities without restriction)  1 - Symptomatic but completely ambulatory (Restricted in physically strenuous activity but ambulatory and able to carry out work of a light or sedentary nature. For example, light housework, office work)  2 - Symptomatic, <50% in bed during the Patricia (Ambulatory and capable of all self care but unable to carry out any work activities. Up and about more than 50% of waking hours)  3 - Symptomatic, >50% in bed, but not bedbound (Capable of only limited self-care, confined to bed or chair 50% or more of waking hours)  4 - Bedbound (Completely disabled. Cannot carry on any self-care. Totally confined to bed or chair)  5 - Death   Eustace Pen MM, Creech RH, Tormey DC, et al. [phone]). "Toxicity and response criteria of the [facility]". [place] Oncol. 5 (6): 649-55   LABORATORY DATA:  Lab Results  Component Value Date   WBC 6.4 12/01/2021   HGB 14.6 12/01/2021   HCT 43.2 12/01/2021   MCV 97.5 12/01/2021   PLT 197 12/01/2021   CMP     Component Value Date/Time   NA 140 12/01/2021 0918   NA 143 10/09/2021 1056   NA 141 09/22/2016 1459   K 4.3 12/01/2021 0918   K 3.8 09/22/2016 1459   CL 102 12/01/2021 0918   CO2 30 12/01/2021 0918   CO2 26 09/22/2016 1459   GLUCOSE  120 (H) 12/01/2021 0918   GLUCOSE 116 09/22/2016 1459   GLUCOSE 88 01/13/2006 1012   BUN 23 12/01/2021 0918   BUN 17 10/09/2021 1056   BUN 19.8 09/22/2016 1459   CREATININE 1.21 (H) 12/01/2021 0918   CREATININE 1.1 09/22/2016 1459   CALCIUM 9.5 12/01/2021 0918   CALCIUM 9.2 09/22/2016 1459   PROT 7.2 12/01/2021 0918   PROT 6.5 10/09/2021 1056   PROT 7.1 09/22/2016 1459   ALBUMIN 4.1 12/01/2021 0918   ALBUMIN 4.3 10/09/2021 1056   ALBUMIN 3.8 09/22/2016 1459   AST 21 12/01/2021 0918  AST 15 09/22/2016 1459   ALT 17 12/01/2021 0918   ALT 21 09/22/2016 1459   ALKPHOS 62 12/01/2021 0918   ALKPHOS 98 09/22/2016 1459   BILITOT 0.7 12/01/2021 0918   BILITOT 0.65 09/22/2016 1459   GFRNONAA 45 (L) 12/01/2021 0918   GFRAA 61 09/28/2019 0847   GFRAA 53 (L) 04/19/2019 0801         RADIOGRAPHY: No results found.    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 She will continue to follow with medical oncology as well.  I will see her back on an as-needed basis.  She and her husband are pleased with this plan  On date of service, in total, I spent 20 minutes on this encounter. Patient was seen in person.   __________________________________________   Eppie Gibson, MD

## 2022-01-06 DIAGNOSIS — M5416 Radiculopathy, lumbar region: Secondary | ICD-10-CM | POA: Diagnosis not present

## 2022-01-13 ENCOUNTER — Other Ambulatory Visit: Payer: Self-pay | Admitting: Physician Assistant

## 2022-01-13 DIAGNOSIS — E032 Hypothyroidism due to medicaments and other exogenous substances: Secondary | ICD-10-CM

## 2022-01-15 ENCOUNTER — Ambulatory Visit (HOSPITAL_COMMUNITY): Payer: Medicare Other | Attending: Cardiology

## 2022-01-15 DIAGNOSIS — I5032 Chronic diastolic (congestive) heart failure: Secondary | ICD-10-CM | POA: Insufficient documentation

## 2022-01-15 DIAGNOSIS — I351 Nonrheumatic aortic (valve) insufficiency: Secondary | ICD-10-CM | POA: Insufficient documentation

## 2022-01-15 DIAGNOSIS — I340 Nonrheumatic mitral (valve) insufficiency: Secondary | ICD-10-CM | POA: Insufficient documentation

## 2022-01-16 LAB — ECHOCARDIOGRAM COMPLETE
MV M vel: 3.47 m/s
MV Peak grad: 48.3 mmHg
P 1/2 time: 633 msec
S' Lateral: 2.8 cm

## 2022-01-21 ENCOUNTER — Encounter (HOSPITAL_COMMUNITY)
Admission: RE | Admit: 2022-01-21 | Discharge: 2022-01-21 | Disposition: A | Discharge status: Home / Self Care | Payer: Medicare Other | Source: Ambulatory Visit | Attending: Hematology & Oncology | Admitting: Hematology & Oncology

## 2022-01-21 DIAGNOSIS — Z170 Estrogen receptor positive status [ER+]: Secondary | ICD-10-CM | POA: Insufficient documentation

## 2022-01-21 DIAGNOSIS — C50412 Malignant neoplasm of upper-outer quadrant of left female breast: Secondary | ICD-10-CM | POA: Diagnosis not present

## 2022-01-21 DIAGNOSIS — C50919 Malignant neoplasm of unspecified site of unspecified female breast: Secondary | ICD-10-CM | POA: Diagnosis not present

## 2022-01-21 LAB — GLUCOSE, CAPILLARY: Glucose-Capillary: 124 mg/dL — ABNORMAL HIGH (ref 70–99)

## 2022-01-21 MED ORDER — FLUDEOXYGLUCOSE F - 18 (FDG) INJECTION
7.4600 | Freq: Once | INTRAVENOUS | Status: AC
  Administered 2022-01-21: 7.46 via INTRAVENOUS

## 2022-01-26 ENCOUNTER — Encounter: Payer: Self-pay | Admitting: *Deleted

## 2022-01-27 ENCOUNTER — Encounter: Payer: Self-pay | Admitting: *Deleted

## 2022-01-27 ENCOUNTER — Encounter: Payer: Self-pay | Admitting: Hematology & Oncology

## 2022-01-27 ENCOUNTER — Inpatient Hospital Stay: Payer: Medicare Other

## 2022-01-27 ENCOUNTER — Inpatient Hospital Stay (HOSPITAL_BASED_OUTPATIENT_CLINIC_OR_DEPARTMENT_OTHER): Payer: Medicare Other | Admitting: Hematology & Oncology

## 2022-01-27 ENCOUNTER — Inpatient Hospital Stay: Payer: Medicare Other | Attending: Hematology & Oncology

## 2022-01-27 VITALS — BP 92/53 | HR 72 | Temp 98.0°F | Resp 18 | Ht 60.0 in | Wt 151.0 lb

## 2022-01-27 DIAGNOSIS — C7951 Secondary malignant neoplasm of bone: Secondary | ICD-10-CM

## 2022-01-27 DIAGNOSIS — C50412 Malignant neoplasm of upper-outer quadrant of left female breast: Secondary | ICD-10-CM | POA: Diagnosis not present

## 2022-01-27 DIAGNOSIS — M8580 Other specified disorders of bone density and structure, unspecified site: Secondary | ICD-10-CM

## 2022-01-27 DIAGNOSIS — Z170 Estrogen receptor positive status [ER+]: Secondary | ICD-10-CM | POA: Diagnosis not present

## 2022-01-27 DIAGNOSIS — Z171 Estrogen receptor negative status [ER-]: Secondary | ICD-10-CM | POA: Diagnosis not present

## 2022-01-27 DIAGNOSIS — M810 Age-related osteoporosis without current pathological fracture: Secondary | ICD-10-CM

## 2022-01-27 DIAGNOSIS — C50912 Malignant neoplasm of unspecified site of left female breast: Secondary | ICD-10-CM | POA: Diagnosis not present

## 2022-01-27 LAB — CBC WITH DIFFERENTIAL (CANCER CENTER ONLY)
Abs Immature Granulocytes: 0.08 10*3/uL — ABNORMAL HIGH (ref 0.00–0.07)
Basophils Absolute: 0 10*3/uL (ref 0.0–0.1)
Basophils Relative: 0 %
Eosinophils Absolute: 0 10*3/uL (ref 0.0–0.5)
Eosinophils Relative: 0 %
HCT: 42.6 % (ref 36.0–46.0)
Hemoglobin: 14.4 g/dL (ref 12.0–15.0)
Immature Granulocytes: 1 %
Lymphocytes Relative: 15 %
Lymphs Abs: 0.8 10*3/uL (ref 0.7–4.0)
MCH: 33.3 pg (ref 26.0–34.0)
MCHC: 33.8 g/dL (ref 30.0–36.0)
MCV: 98.4 fL (ref 80.0–100.0)
Monocytes Absolute: 0.5 10*3/uL (ref 0.1–1.0)
Monocytes Relative: 9 %
Neutro Abs: 4.2 10*3/uL (ref 1.7–7.7)
Neutrophils Relative %: 75 %
Platelet Count: 212 10*3/uL (ref 150–400)
RBC: 4.33 MIL/uL (ref 3.87–5.11)
RDW: 13.2 % (ref 11.5–15.5)
WBC Count: 5.6 10*3/uL (ref 4.0–10.5)
nRBC: 0 % (ref 0.0–0.2)

## 2022-01-27 LAB — CMP (CANCER CENTER ONLY)
ALT: 14 U/L (ref 0–44)
AST: 19 U/L (ref 15–41)
Albumin: 3.9 g/dL (ref 3.5–5.0)
Alkaline Phosphatase: 69 U/L (ref 38–126)
Anion gap: 8 (ref 5–15)
BUN: 22 mg/dL (ref 8–23)
CO2: 29 mmol/L (ref 22–32)
Calcium: 9.2 mg/dL (ref 8.9–10.3)
Chloride: 105 mmol/L (ref 98–111)
Creatinine: 1.25 mg/dL — ABNORMAL HIGH (ref 0.44–1.00)
GFR, Estimated: 43 mL/min — ABNORMAL LOW (ref >60)
Glucose, Bld: 111 mg/dL — ABNORMAL HIGH (ref 70–99)
Potassium: 4.1 mmol/L (ref 3.5–5.1)
Sodium: 142 mmol/L (ref 135–145)
Total Bilirubin: 0.6 mg/dL (ref 0.3–1.2)
Total Protein: 6.4 g/dL — ABNORMAL LOW (ref 6.5–8.1)

## 2022-01-27 MED ORDER — DENOSUMAB 120 MG/1.7ML ~~LOC~~ SOLN
120.0000 mg | Freq: Once | SUBCUTANEOUS | Status: AC
  Administered 2022-01-27: 120 mg via SUBCUTANEOUS
  Filled 2022-01-27: qty 1.7

## 2022-01-27 NOTE — Patient Instructions (Signed)
Denosumab Injection (Oncology) What is this medication? DENOSUMAB (den oh SUE mab) prevents weakened bones caused by cancer. It may also be used to treat noncancerous bone tumors that cannot be removed by surgery. It can also be used to treat high calcium levels in the blood caused by cancer. It works by blocking a protein that causes bones to break down quickly. This slows down the release of calcium from bones, which lowers calcium levels in your blood. It also makes your bones stronger and less likely to break (fracture). This medicine may be used for other purposes; ask your health care provider or pharmacist if you have questions. COMMON BRAND NAME(S): XGEVA What should I tell my care team before I take this medication? They need to know if you have any of these conditions: Dental disease Having surgery or tooth extraction Infection Kidney disease Low levels of calcium or vitamin D in the blood Malnutrition On hemodialysis Skin conditions or sensitivity Thyroid or parathyroid disease An unusual reaction to denosumab, other medications, foods, dyes, or preservatives Pregnant or trying to get pregnant Breast-feeding How should I use this medication? This medication is for injection under the skin. It is given by your care team in a hospital or clinic setting. A special MedGuide will be given to you before each treatment. Be sure to read this information carefully each time. Talk to your care team about the use of this medication in children. While it may be prescribed for children as young as 13 years for selected conditions, precautions do apply. Overdosage: If you think you have taken too much of this medicine contact a poison control center or emergency room at once. NOTE: This medicine is only for you. Do not share this medicine with others. What if I miss a dose? Keep appointments for follow-up doses. It is important not to miss your dose. Call your care team if you are unable to  keep an appointment. What may interact with this medication? Do not take this medication with any of the following: Other medications containing denosumab This medication may also interact with the following: Medications that lower your chance of fighting infection Steroid medications, such as prednisone or cortisone This list may not describe all possible interactions. Give your health care provider a list of all the medicines, herbs, non-prescription drugs, or dietary supplements you use. Also tell them if you smoke, drink alcohol, or use illegal drugs. Some items may interact with your medicine. What should I watch for while using this medication? Your condition will be monitored carefully while you are receiving this medication. You may need blood work while taking this medication. This medication may increase your risk of getting an infection. Call your care team for advice if you get a fever, chills, sore throat, or other symptoms of a cold or flu. Do not treat yourself. Try to avoid being around people who are sick. You should make sure you get enough calcium and vitamin D while you are taking this medication, unless your care team tells you not to. Discuss the foods you eat and the vitamins you take with your care team. Some people who take this medication have severe bone, joint, or muscle pain. This medication may also increase your risk for jaw problems or a broken thigh bone. Tell your care team right away if you have severe pain in your jaw, bones, joints, or muscles. Tell your care team if you have any pain that does not go away or that gets worse. Talk   to your care team if you may be pregnant. Serious birth defects can occur if you take this medication during pregnancy and for 5 months after the last dose. You will need a negative pregnancy test before starting this medication. Contraception is recommended while taking this medication and for 5 months after the last dose. Your care team  can help you find the option that works for you. What side effects may I notice from receiving this medication? Side effects that you should report to your care team as soon as possible: Allergic reactions--skin rash, itching, hives, swelling of the face, lips, tongue, or throat Bone, joint, or muscle pain Low calcium level--muscle pain or cramps, confusion, tingling, or numbness in the hands or feet Osteonecrosis of the jaw--pain, swelling, or redness in the mouth, numbness of the jaw, poor healing after dental work, unusual discharge from the mouth, visible bones in the mouth Side effects that usually do not require medical attention (report to your care team if they continue or are bothersome): Cough Diarrhea Fatigue Headache Nausea This list may not describe all possible side effects. Call your doctor for medical advice about side effects. You may report side effects to FDA at 1-800-FDA-1088. Where should I keep my medication? This medication is given in a hospital or clinic. It will not be stored at home. NOTE: This sheet is a summary. It may not cover all possible information. If you have questions about this medicine, talk to your doctor, pharmacist, or health care provider.  2023 Elsevier/Gold Standard (2021-07-07 00:00:00)  

## 2022-01-27 NOTE — Progress Notes (Signed)
Hematology and Oncology Follow Up Visit  Patricia [name] [id number] October 28, 1940 81 y.o. 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 Springsteen is back for follow-up.  She is doing quite nicely.  She feels okay.  She still has little bit of pain with the left hip.  We did do a PET scan on her.  This was done on 01/21/2022.  The PET scan showed continued improvement in the bone metastasis.  She has decreased activity in the L5 metastasis and also the left acetabular metastasis.  There is no areas of new metastasis.  She and her husband did have a nice Thanksgiving.  They are with family.  She has had no cough or shortness of breath.  She has had no nausea or vomiting.  There is been no change in bowel or bladder habits.  Her last CA 27.29 was 14.3.  Overall, I would say performance status is probably ECOG 1.   Medications:  Current Outpatient Medications:    ADVAIR HFA 230-21 MCG/ACT inhaler, USE 2 INHALATIONS TWICE A DAY, Disp: 36 g, Rfl: 3   albuterol (PROVENTIL HFA;VENTOLIN HFA) 108 (90 Base) MCG/ACT inhaler, Inhale 2 puffs into the lungs every 6 (six) hours as needed for wheezing or shortness of breath., Disp: , Rfl:    apixaban (ELIQUIS) 5 MG TABS tablet, TAKE 1 TABLET TWICE A DAY, Disp: 180 tablet, Rfl: 3   Apoaequorin (PREVAGEN) 10 MG CAPS, Take 1 capsule by mouth every morning., Disp: 90 capsule, Rfl: 3   ARMOUR THYROID 15 MG tablet, TAKE 1 TABLET DAILY ALONG WITH 60 MG, Disp: 90 tablet, Rfl: 3   ARMOUR THYROID 60 MG tablet, TAKE 1 TABLET DAILY BEFORE BREAKFAST, Disp: 90 tablet, Rfl: 3   atorvastatin (LIPITOR) 40 MG tablet, Take 1 tablet (40 mg total) by mouth daily before supper.,  Disp: 90 tablet, Rfl: 3   bimatoprost (LUMIGAN) 0.01 % SOLN, Place 1 drop into both eyes 2 (two) times daily., Disp: , Rfl:    calcium-vitamin D (OSCAL WITH D) 500-200 MG-UNIT tablet, Take 1 tablet by mouth daily with breakfast., Disp: 30 tablet, Rfl: 0   diltiazem (CARDIZEM CD) 300 MG 24 hr capsule, Take 1 capsule (300 mg total) by mouth daily. Please keep scheduled appointment for future refills. Thank you., Disp: 90 capsule, Rfl: 0   dorzolamide-timolol (COSOPT) 22.3-6.8 MG/ML ophthalmic solution, Place 1 drop into both eyes at bedtime., Disp: , Rfl:    fluticasone (FLONASE) 50 MCG/ACT nasal spray, 1 spray each nostril following sinus rinses twice daily, Disp: 16 g, Rfl: 2   furosemide (LASIX) 40 MG tablet, Take 1 tablet (40 mg total) by mouth 2 (two) times daily., Disp: 180 tablet, Rfl: 3   gabapentin (NEURONTIN) 400 MG capsule, TAKE 1 CAPSULE TWICE A DAY, Disp: 180 capsule, Rfl: 3   L-Methylfolate-B12-B6-B2 (CEREFOLIN) 07-31-48-5 MG TABS, TAKE 1 TABLET BY MOUTH EVERY MORNING, Disp: 90 tablet, Rfl: 3   levETIRAcetam (KEPPRA XR) 500 MG 24 hr tablet, Take 1 tablet (500 mg total) by mouth at bedtime., Disp: 90 tablet, Rfl: 3   loratadine (CLARITIN) 10 MG tablet, Take 10 mg by mouth daily., Disp: , Rfl:    memantine (NAMENDA) 10 MG tablet, Take 1  tablet (10 mg total) by mouth 2 (two) times daily., Disp: 180 tablet, Rfl: 3   metoprolol succinate (TOPROL-XL) 50 MG 24 hr tablet, Take 1.5 tablets (75 mg total) by mouth daily. Take with or immediately following a meal., Disp: 135 tablet, Rfl: 2   Multiple Vitamins-Iron (MULTIVITAMIN/IRON) TABS, Take 1 tablet by mouth daily., Disp: , Rfl:    Netarsudil Dimesylate (RHOPRESSA) 0.02 % SOLN, Place 1 drop into both eyes at bedtime., Disp: , Rfl:    pantoprazole (PROTONIX) 40 MG tablet, TAKE 1 TABLET DAILY, Disp: 90 tablet, Rfl: 3   potassium chloride SA (KLOR-CON M) 20 MEQ tablet, TAKE 1 TABLET TWICE A DAY, Disp: 180 tablet, Rfl: 3   traMADol (ULTRAM) 50 MG  tablet, Take 1 tablet (50 mg total) by mouth every 6 (six) hours as needed., Disp: 60 tablet, Rfl: 0   Vitamin D, Ergocalciferol, (DRISDOL) 1.25 MG (50000 UNIT) CAPS capsule, TAKE 1 CAPSULE WEEKLY, Disp: 12 capsule, Rfl: 3  Allergies:  Allergies  Allergen Reactions   Combigan [Brimonidine Tartrate-Timolol] Itching    Eyes itch, reddened   Other Other (See Comments)    Per patient made OU red, Sore, and sensitivity to light   Sulfa Antibiotics Rash   Sulfamethoxazole-Trimethoprim Hives    Past Medical History, Surgical history, Social history, and Family History were reviewed and updated.  Review of Systems: Review of Systems  Constitutional: Negative.   HENT:  Negative.    Eyes: Negative.   Respiratory: Negative.    Cardiovascular: Negative.   Gastrointestinal: Negative.   Endocrine: Negative.   Genitourinary: Negative.    Musculoskeletal: Negative.   Skin: Negative.   Neurological: Negative.   Hematological: Negative.   Psychiatric/Behavioral: Negative.      Physical Exam:  height is 5' (1.524 m) and weight is 151 lb (68.5 kg). Her oral temperature is 98 F (36.7 C). Her blood pressure is 92/53 (abnormal) and her pulse is 72. Her respiration is 18 and oxygen saturation is 97%.   Wt Readings from Last 3 Encounters:  01/27/22 151 lb (68.5 kg)  01/02/22 149 lb 8 oz (67.8 kg)  12/01/21 148 lb (67.1 kg)    Physical Exam Vitals reviewed.  HENT:     Head: Normocephalic and atraumatic.  Eyes:     Pupils: Pupils are equal, round, and reactive to light.  Cardiovascular:     Rate and Rhythm: Normal rate and regular rhythm.     Heart sounds: Normal heart sounds.  Pulmonary:     Effort: Pulmonary effort is normal.     Breath sounds: Normal breath sounds.  Abdominal:     General: Bowel sounds are normal.     Palpations: Abdomen is soft.  Musculoskeletal:        General: No tenderness or deformity. Normal range of motion.     Cervical back: Normal range of motion.   Lymphadenopathy:     Cervical: No cervical adenopathy.  Skin:    General: Skin is warm and dry.     Findings: No erythema or rash.  Neurological:     Mental Status: She is alert and oriented to person, place, and time.  Psychiatric:        Behavior: Behavior normal.        Thought Content: Thought content normal.        Judgment: Judgment normal.     Lab Results  Component Value Date   WBC 5.6 01/27/2022   HGB 14.4 01/27/2022   HCT 42.6 01/27/2022  MCV 98.4 01/27/2022   PLT 212 01/27/2022     Chemistry      Component Value Date/Time   NA 142 01/27/2022 0903   NA 143 10/09/2021 1056   NA 141 09/22/2016 1459   K 4.1 01/27/2022 0903   K 3.8 09/22/2016 1459   CL 105 01/27/2022 0903   CO2 29 01/27/2022 0903   CO2 26 09/22/2016 1459   BUN 22 01/27/2022 0903   BUN 17 10/09/2021 1056   BUN 19.8 09/22/2016 1459   CREATININE 1.25 (H) 01/27/2022 0903   CREATININE 1.1 09/22/2016 1459      Component Value Date/Time   CALCIUM 9.2 01/27/2022 0903   CALCIUM 9.2 09/22/2016 1459   ALKPHOS 69 01/27/2022 0903   ALKPHOS 98 09/22/2016 1459   AST 19 01/27/2022 0903   AST 15 09/22/2016 1459   ALT 14 01/27/2022 0903   ALT 21 09/22/2016 1459   BILITOT 0.6 01/27/2022 0903   BILITOT 0.65 09/22/2016 1459      Impression and Plan: Ms. Payeur is a very nice 81 year old postmenopausal white female.  She has a solitary metastatic focus from breast cancer.  Her initial breast cancer was probably 7 years ago.  It was estrogen positive.  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  The HER2 was equivocal on initial staining but on FISH was negative.  At this point, we will just follow her along.  She would prefer not to have any systemic therapy.  Given that she has triple negative breast cancer, our options would be anti-HER2 therapy or chemotherapy with anti-HER2 therapy.  I would like to get another PET scan on her in January.  I think this would be  reasonable.  I am sure that, at some point, her cancer will come back.  I just wanted quality life to be as good as possible.  I would like to try to hold off on treatment as long as we can.  We will plan to get her back in January.  She will get her Delton See today.  Next Delton See will probably will not be until we see her back.    Volanda Napoleon, MD 11/28/202310:09 AM

## 2022-01-27 NOTE — Progress Notes (Signed)
Patient has completed her radiation and her PET scan shows good response. At this time she will continue with observation only. She will need a PET at the end of January. Will schedule closer to that time and once authorization is obtained.  Oncology Nurse Navigator Documentation     01/27/2022   11:45 AM  Oncology Nurse Navigator Flowsheets  Navigator Follow Up Date: 03/03/2022  Navigator Follow Up Reason: Radiology  Navigator Location [facility]  Navigator Encounter Type Appt/Treatment Plan Review  Patient Visit Type MedOnc  Treatment Phase Active Tx  Barriers/Navigation Needs No Barriers At This Time  Interventions None Required  Acuity Level 1-No Barriers  Support Groups/Services Friends and Family  Time Spent with Patient 15

## 2022-01-28 LAB — CANCER ANTIGEN 27.29: CA 27.29: 13.7 U/mL (ref 0.0–38.6)

## 2022-02-04 NOTE — Progress Notes (Signed)
Cardiology Office Note:    Date:  02/05/2022   ID:  Patricia [name], DOB 1940-12-31, MRN [id number]  PCP:  Mayer Masker, PA-C   [facility] Providers Cardiologist:  Armanda Magic, MD Electrophysiologist:  Will Jorja Loa, MD     Referring MD: Mayer Masker, PA-C   Chief Complaint: abnormal echocardiogram  History of Present Illness:    Patricia [name] is a very pleasant 81 y.o. female with a hx of permanent atrial fibrillation, CVA, chronic HFpEF, aortic valve regurgitation, normal stress test in 2017, lung cancer s/p resection 2003, breast cancer s/p lumpectomy/tamoxifen/XRT, HTN, HLD, and OSA. She was previously on amiodarone for a fib but it was stopped due to prolonged QT.   She underwent sleep study in November 2021 and was found to have mild OSA with an AHI of 9.6/h with no central apneas and underwent CPAP titration but could not be adequately treated due to ongoing events and was ultimately placed on BiPAP.  2D echo 12/10/2020 for follow-up of mitral regurgitation showed normal LV size and systolic function, mild RV enlargement and moderate right atrial enlargement as well as moderate to severe MR and TR which are new.  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  She was having symptoms of worsening lower extremity edema, orthopnea, and shortness of breath.  She was referred to structural heart clinic and seen by Dr. Excell Seltzer who felt she had moderate to severe 3+ mitral regurgitation due to a combination of atrial function MR with marked left atrial enlargement as well as degenerative mitral regurgitation with severe calcification of the P2 scallop of the posterior leaflet.  She was NYHA functional class I-II with fatigue.  Felt not to be a candidate for transcatheter therapies because of severe posterior  leaflet calcification.  Ongoing medical therapy was recommended  Last in person cardiology clinic visit was 04/17/2021 with Dr. Mayford Knife at which time she was doing well and tolerating medications with no significant symptoms. No changes to treatment regimen and recommendation to follow-up in 6 months.  Cleared by telephone assessment on 12/31/21 for lumbar spine procedure.   Today, she is here with her husband. Had an echocardiogram 01/16/22 that revealed low normal LVEF 50 to 55%, mildly reduced RV function, severe biatrial enlargement, moderate MR, severe TR and mild to moderate AR. Compared to prior exams, the aortic and mitral valves appear progressively degenerative. Possible distal leaflet tip thickening on both the mitral valve and aortic valve.  Consider evaluation for nonbacterial thrombotic endocarditis. She was advised to make an appointment to come in. Reports she is overall feeling well. Having nerve pain since undergoing XRT for bone cancer. Currently showing improvement on bone lesions and with lab testing, is being followed by Dr. Myna Hidalgo.  She denies chest pain, shortness of breath, orthopnea, PND, presyncope, syncope.  Has 1+ bilateral pitting edema in her lower extremities.  Seems unaware of this. Continues to play golf with her husband.  We discussed the findings on the echocardiogram. No recent feelings of illness. She is afebrile.   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 Dx 04/2015 at time of stroke. b. DCCV 06/2015 - did not hold. Amio started then stopped due to QT prolongation.   Chronic cough    Chronic diastolic heart failure (HCC) 06/17/2015   CKD (chronic kidney disease), stage III (HCC)    Cough variant asthma    Disease of pharynx or nasopharynx     Diverticulosis    Enlargement of right atrium 01/23/2020   Essential hypertension    GERD (gastroesophageal reflux disease)    Glaucoma    Hiatal hernia    Hyperlipidemia    Internal hemorrhoids    Laryngospasm    Mitral regurgitation    mild to moderate by echo 12/2019 and moderate by TEE 12/2020   Multinodular goiter    Osteopenia    Postmenopausal    Pulmonary HTN (HCC)     Moderate with PASP by echo 2018 likely Group 2 from pulmonary venous HTN from CHF>>normal PAP by echo 12/2019   Radiation 10/22/14-11/23/14   Left Breast   Stricture and stenosis of cervix     Past Surgical History:  Procedure Laterality Date   BREAST LUMPECTOMY Left    BREAST SURGERY     CARDIOVERSION N/A 06/24/2015   Procedure: CARDIOVERSION;  Surgeon: Quintella Reichert, MD;  Location: [facility];  Service: Cardiovascular;  Laterality: N/A;   CARDIOVERSION N/A 05/19/2016   Procedure: CARDIOVERSION;  Surgeon: Quintella Reichert, MD;  Location: [facility];  Service: Cardiovascular;  Laterality: N/A;   COLONOSCOPY     LUNG CANCER SURGERY  2003   resection carcinoid lingula-lt upper lobe   RADIOACTIVE SEED GUIDED PARTIAL MASTECTOMY WITH AXILLARY SENTINEL LYMPH NODE BIOPSY Left 06/26/2014   Procedure: RADIOACTIVE SEED GUIDED PARTIAL MASTECTOMY WITH AXILLARY SENTINEL LYMPH NODE BIOPSY;  Surgeon: Almond Lint, MD;  Location: [facility];  Service: General;  Laterality: Left;   RE-EXCISION OF BREAST LUMPECTOMY Left 08/07/2014   Procedure: RE-EXCISION OF LEFT BREAST LUMPECTOMY;  Surgeon: Almond Lint, MD;  Location: [facility];  Service: General;  Laterality: Left;   RIGHT HEART CATH N/A 04/27/2016   Procedure: Right Heart Cath;  Surgeon: Laurey Morale, MD;  Location: [facility] INVASIVE CV LAB;  Service: Cardiovascular;  Laterality: N/A;   TEE WITHOUT CARDIOVERSION N/A 01/07/2021   Procedure: TRANSESOPHAGEAL ECHOCARDIOGRAM (TEE);  Surgeon: Vesta Mixer, MD;  Location: [facility] ENDOSCOPY;   Service: Cardiovascular;  Laterality: N/A;    Current Medications: Current Meds  Medication Sig   ADVAIR HFA 230-21 MCG/ACT inhaler USE 2 INHALATIONS TWICE A DAY   albuterol (PROVENTIL HFA;VENTOLIN HFA) 108 (90 Base) MCG/ACT inhaler Inhale 2 puffs into the lungs every 6 (six) hours as needed for wheezing or shortness of breath.   apixaban (ELIQUIS) 5 MG TABS tablet TAKE 1 TABLET TWICE A DAY   Apoaequorin (PREVAGEN) 10 MG CAPS Take 1 capsule by mouth every morning.   ARMOUR THYROID 15 MG tablet TAKE 1 TABLET DAILY ALONG WITH 60 MG   ARMOUR THYROID 60 MG tablet TAKE 1 TABLET DAILY BEFORE BREAKFAST   atorvastatin (LIPITOR) 40 MG tablet Take 1 tablet (40 mg total) by mouth daily before supper.   bimatoprost (LUMIGAN) 0.01 % SOLN Place 1 drop into both eyes 2 (two) times daily.   calcium-vitamin D (OSCAL WITH D) 500-200 MG-UNIT tablet Take 1 tablet by mouth daily with breakfast.   diltiazem (CARDIZEM CD) 300 MG 24 hr capsule Take 1 capsule (300 mg total) by mouth daily. Please keep  scheduled appointment for future refills. Thank you.   dorzolamide-timolol (COSOPT) 22.3-6.8 MG/ML ophthalmic solution Place 1 drop into both eyes at bedtime.   fluticasone (FLONASE) 50 MCG/ACT nasal spray 1 spray each nostril following sinus rinses twice daily   furosemide (LASIX) 40 MG tablet Take 1 tablet (40 mg total) by mouth 2 (two) times daily.   gabapentin (NEURONTIN) 400 MG capsule TAKE 1 CAPSULE TWICE A DAY   L-Methylfolate-B12-B6-B2 (CEREFOLIN) 07-31-48-5 MG TABS TAKE 1 TABLET BY MOUTH EVERY MORNING   levETIRAcetam (KEPPRA XR) 500 MG 24 hr tablet Take 1 tablet (500 mg total) by mouth at bedtime.   loratadine (CLARITIN) 10 MG tablet Take 10 mg by mouth daily.   memantine (NAMENDA) 10 MG tablet Take 1 tablet (10 mg total) by mouth 2 (two) times daily.   metoprolol succinate (TOPROL-XL) 50 MG 24 hr tablet Take 1.5 tablets (75 mg total) by mouth daily. Take with or immediately following a meal.   Multiple  Vitamins-Iron (MULTIVITAMIN/IRON) TABS Take 1 tablet by mouth daily.   Netarsudil Dimesylate (RHOPRESSA) 0.02 % SOLN Place 1 drop into both eyes at bedtime.   pantoprazole (PROTONIX) 40 MG tablet TAKE 1 TABLET DAILY   potassium chloride SA (KLOR-CON M) 20 MEQ tablet TAKE 1 TABLET TWICE A DAY   traMADol (ULTRAM) 50 MG tablet Take 1 tablet (50 mg total) by mouth every 6 (six) hours as needed.   Vitamin D, Ergocalciferol, (DRISDOL) 1.25 MG (50000 UNIT) CAPS capsule TAKE 1 CAPSULE WEEKLY     Allergies:   Combigan [brimonidine tartrate-timolol], Other, Sulfa antibiotics, and Sulfamethoxazole-trimethoprim   Social History   Socioeconomic History   Marital status: Married    Spouse name: Charles   Number of children: 2   Years of education: Not on file   Highest education level: High school graduate  Occupational History   Occupation: Retired  Tobacco Use   Smoking status: Former    Packs/day: 0.75    Years: 5.00    Total pack years: 3.75    Types: Cigarettes    Quit date: 06/15/1966    Years since quitting: 55.6    Passive exposure: Past   Smokeless tobacco: Never  Vaping Use   Vaping Use: Never used  Substance and Sexual Activity   Alcohol use: Not Currently    Comment: occasional wine   Drug use: No   Sexual activity: Not Currently    Birth control/protection: Post-menopausal  Other Topics Concern   Not on file  Social History Narrative   Retired. Lives with husband.    Right Handed   Drinks 1-2 cups caffeine daily   Social Determinants of Health   Financial Resource Strain: Low Risk  (10/09/2021)   Overall Financial Resource Strain (CARDIA)    Difficulty of Paying Living Expenses: Not hard at all  Food Insecurity: No Food Insecurity (10/09/2021)   Hunger Vital Sign    Worried About Running Out of Food in the Last Year: Never true    Ran Out of Food in the Last Year: Never true  Transportation Needs: No Transportation Needs (10/09/2021)   PRAPARE - Therapist, art (Medical): No    Lack of Transportation (Non-Medical): No  Physical Activity: Insufficiently Active (10/09/2021)   Exercise Vital Sign    Days of Exercise per Week: 3 days    Minutes of Exercise per Session: 30 min  Stress: No Stress Concern Present (10/09/2021)   [employer] of Occupational Health - Occupational Stress Questionnaire  Feeling of Stress : Not at all  Social Connections: Socially Integrated (10/09/2021)   Social Connection and Isolation Panel [NHANES]    Frequency of Communication with Friends and Family: More than three times a week    Frequency of Social Gatherings with Friends and Family: Three times a week    Attends Religious Services: More than 4 times per year    Active Member of Clubs or Organizations: Yes    Attends Engineer, structural: More than 4 times per year    Marital Status: Married     Family History: The patient's family history includes Atrial fibrillation in her son; Hyperlipidemia in her father and mother; Hypertension in her father and mother; Stroke in her father and mother; Transient ischemic attack in her father. There is no history of Heart attack.  ROS:   Please see the history of present illness.  All other systems reviewed and are negative.  Labs/Other Studies Reviewed:    The following studies were reviewed today:  Echo 01/16/22 1. Left ventricular ejection fraction, by estimation, is 50 to 55%. The  left ventricle has low normal function. The left ventricle has no regional  wall motion abnormalities. Left ventricular diastolic parameters are  indeterminate.   2. Right ventricular systolic function is mildly reduced. The right  ventricular size is normal. There is normal pulmonary artery systolic  pressure. The estimated right ventricular systolic pressure is 31.1 mmHg.   3. Left atrial size was severely dilated.   4. Right atrial size was severely dilated.   5. The mitral valve is abnormal.  Moderate mitral valve regurgitation. No  evidence of mitral stenosis.   6. Tricuspid valve regurgitation is severe.   7. The aortic valve is tricuspid. There is moderate calcification of the  aortic valve. There is mild thickening of the aortic valve. Aortic valve  regurgitation is mild to moderate. No aortic stenosis is present.   8. The inferior vena cava is normal in size with greater than 50%  respiratory variability, suggesting right atrial pressure of 3 mmHg.   Conclusion(s)/Recommendation(s): Compared to prior exams, the aortic and  mitral valves appear progressively degenerative. Possible distal leaflet  tip thickening on both the mitral valve and aortic valve. Consider  evaluation for nonbacterial thrombotic  endocarditis if clinically relevant.  Cardiac Monitor 05/05/21 Predeominant rhythm was atrial fibrillation with average heart rate 95bpm and ranged from 63 to 153bpm. Rare PVC.  Echo TEE 01/07/21 1. Left ventricular ejection fraction, by estimation, is 50 to 55%. The  left ventricle has low normal function.   2. Right ventricular systolic function is normal. The right ventricular  size is normal.   3. Left atrial size was severely dilated. No left atrial/left atrial  appendage thrombus was detected.   4. Right atrial size was severely dilated.   5. There is no reversal of flow in the pulmonary veins. . The mitral  valve is abnormal. Moderate to severe mitral valve regurgitation.   6. The tricuspid valve is myxomatous. Tricuspid valve regurgitation is  moderate.   7. The aortic valve is tricuspid. Aortic valve regurgitation is trivial.   8. There is mild (Grade II) plaque.    Recent Labs: 10/09/2021: TSH 2.690 01/27/2022: ALT 14; BUN 22; Creatinine 1.25; Hemoglobin 14.4; Platelet Count 212; Potassium 4.1; Sodium 142  Recent Lipid Panel    Component Value Date/Time   CHOL 134 09/30/2020 0831   TRIG 49 09/30/2020 0831   TRIG 52 01/13/2006 1012   HDL  61 09/30/2020  0831   CHOLHDL 2.2 09/30/2020 0831   CHOLHDL 3 04/01/2016 0952   VLDL 19.2 04/01/2016 0952   LDLCALC 62 09/30/2020 0831   LDLDIRECT 64 10/09/2021 1056   LDLDIRECT 174.3 06/04/2009 0000     Risk Assessment/Calculations:    CHA2DS2-VASc Score = 7  This indicates a 11.2% annual risk of stroke. The patient's score is based upon: CHF History: 1 HTN History: 1 Diabetes History: 0 Stroke History: 2 Vascular Disease History: 0 Age Score: 2 Gender Score: 1    Physical Exam:    VS:  BP 118/66   Pulse (!) 107   Ht 5' (1.524 m)   Wt 151 lb 6.4 oz (68.7 kg)   SpO2 93%   BMI 29.57 kg/m     Wt Readings from Last 3 Encounters:  02/05/22 151 lb 6.4 oz (68.7 kg)  01/27/22 151 lb (68.5 kg)  01/02/22 149 lb 8 oz (67.8 kg)     GEN:  Well nourished, well developed in no acute distress HEENT: Normal NECK: No JVD; No carotid bruits CARDIAC: Irregular RR. Difficult to assess for murmurs. No rubs, gallops RESPIRATORY:  Clear to auscultation without rales, wheezing or rhonchi  ABDOMEN: Soft, non-tender, non-distended MUSCULOSKELETAL:  Bilateral 1+ pitting edema; No deformity. 2+ pedal pulses, equal bilaterally SKIN: Warm and dry NEUROLOGIC:  Alert and oriented x 3 PSYCHIATRIC:  Normal affect   EKG:  EKG is ordered today.  The ekg ordered today demonstrates atrial fibrillation with rapid ventricular response at 107 bpm, nonspecific ST abnormality   Diagnoses:    1. Chronic diastolic heart failure (HCC)   2. Permanent atrial fibrillation (HCC)   3. Essential hypertension   4. Nonrheumatic mitral valve regurgitation   5. Nonrheumatic aortic valve insufficiency   6. Nonrheumatic tricuspid valve regurgitation    Assessment and Plan:     Valve disease (Mitral regurgitation, aortic insufficiency, and tricuspid regurgitation): Underwent TEE 01/07/2021 showing low normal LV function with severe left atrial enlargement, severe right atrial enlargement and moderate to severe MR with  myxomatous tricuspid valve and moderate TR. Seen by Dr. Excell Seltzer for structural heart evaluation and deemed not a candidate for transcatheter therapies. Only options if needed would be clinical trial devices or cardiac surgical repair or replacement. Repeat echo 01/16/2022 revealed progressive degeneration of aortic and mitral valves, moderate MR, severe TR, and mild to moderate AR as well as severe biatrial enlargement and mildly reduced RVF, LVEF 50-55%. Question of endocarditis.  She is tachycardic, but relatively asymptomatic today. Reviewed findings with Dr. Lynnette Caffey, DOD who advised we check CBC and CRP for evaluation of underlying infection and consideration of proceeding with repeat TEE. Will await lab results and review with Dr. Mayford Knife at that time. Patient is agreeable that if findings indicate the need, risks have been reviewed and she will proceed with TEE if indicated.   Permanent atrial fibrillation on chronic anticoagulation: Severe dilation of LA so not a candidate for ablation.  Did not tolerate amiodarone due to prolonged QT.  Rate control strategy. HR is elevated today.  Consideration given to increasing Toprol, however we will hold off on this for now. Tachycardia may be indication of underlying infection as noted above, will await lab results.  No bleeding concerns on Eliquis, is on appropriate dose at 5 mg twice daily. Continue diltiazem, Toprol, Eliquis.  Chronic diastolic CHF: 1+ pitting edema bilateral LE. No additional evidence of volume overload on exam today.  She denies dyspnea, orthopnea, PND, chest pain.  Continue Lasix 40 mg daily.  Hypertension: BP is well-controlled  Shared Decision Making/Informed Consent The risks [esophageal damage, perforation (1:10,000 risk), bleeding, pharyngeal hematoma as well as other potential complications associated with conscious sedation including aspiration, arrhythmia, respiratory failure and death], benefits (treatment guidance and diagnostic  support) and alternatives of a transesophageal echocardiogram were discussed in detail with Ms. Olguin and she is willing to proceed.    Disposition: Keep your January appointment with Dr. Mayford Knife  Medication Adjustments/Labs and Tests Ordered: Current medicines are reviewed at length with the patient today.  Concerns regarding medicines are outlined above.  Orders Placed This Encounter  Procedures   C-reactive protein   CBC   EKG 12-Lead   No orders of the defined types were placed in this encounter.   Patient Instructions  Medication Instructions:   Your physician recommends that you continue on your current medications as directed. Please refer to the Current Medication list given to you today.   *If you need a refill on your cardiac medications before your next appointment, please call your pharmacy*   Lab Work:  None ordered.  If you have labs (blood work) drawn today and your tests are completely normal, you will receive your results only by: MyChart Message (if you have MyChart) OR A paper copy in the mail If you have any lab test that is abnormal or we need to change your treatment, we will call you to review the results.   Testing/Procedures:  None ordered.   Follow-Up: At [facility], you and your health needs are our priority.  As part of our continuing mission to provide you with exceptional heart care, we have created designated Provider Care Teams.  These Care Teams include your primary Cardiologist (physician) and Advanced Practice Providers (APPs -  Physician Assistants and Nurse Practitioners) who all work together to provide you with the care you need, when you need it.  We recommend signing up for the patient portal called "MyChart".  Sign up information is provided on this After Visit Summary.  MyChart is used to connect with patients for Virtual Visits (Telemedicine).  Patients are able to view lab/test results, encounter notes, upcoming  appointments, etc.  Non-urgent messages can be sent to your provider as well.   To learn more about what you can do with MyChart, go to [email or web address].    Your next appointment:   1 month(s)  The format for your next appointment:   In Person  Provider:   Armanda Magic, MD     Important Information About Sugar         Signed, Levi Aland, NP  02/05/2022 4:04 PM    [facility]

## 2022-02-05 ENCOUNTER — Ambulatory Visit: Payer: Medicare Other | Attending: Nurse Practitioner | Admitting: Nurse Practitioner

## 2022-02-05 ENCOUNTER — Encounter: Payer: Self-pay | Admitting: Nurse Practitioner

## 2022-02-05 VITALS — BP 118/66 | HR 107 | Ht 60.0 in | Wt 151.4 lb

## 2022-02-05 DIAGNOSIS — I10 Essential (primary) hypertension: Secondary | ICD-10-CM | POA: Diagnosis not present

## 2022-02-05 DIAGNOSIS — I351 Nonrheumatic aortic (valve) insufficiency: Secondary | ICD-10-CM | POA: Insufficient documentation

## 2022-02-05 DIAGNOSIS — I5032 Chronic diastolic (congestive) heart failure: Secondary | ICD-10-CM | POA: Diagnosis not present

## 2022-02-05 DIAGNOSIS — I340 Nonrheumatic mitral (valve) insufficiency: Secondary | ICD-10-CM | POA: Insufficient documentation

## 2022-02-05 DIAGNOSIS — I4819 Other persistent atrial fibrillation: Secondary | ICD-10-CM | POA: Diagnosis not present

## 2022-02-05 DIAGNOSIS — I361 Nonrheumatic tricuspid (valve) insufficiency: Secondary | ICD-10-CM | POA: Insufficient documentation

## 2022-02-05 DIAGNOSIS — I4821 Permanent atrial fibrillation: Secondary | ICD-10-CM | POA: Insufficient documentation

## 2022-02-05 NOTE — Patient Instructions (Signed)
Medication Instructions:   Your physician recommends that you continue on your current medications as directed. Please refer to the Current Medication list given to you today.   *If you need a refill on your cardiac medications before your next appointment, please call your pharmacy*   Lab Work:  None ordered.  If you have labs (blood work) drawn today and your tests are completely normal, you will receive your results only by: [place] (if you have MyChart) OR A paper copy in the mail If you have any lab test that is abnormal or we need to change your treatment, we will call you to review the results.   Testing/Procedures:  None ordered.   Follow-Up: At [facility], you and your health needs are our priority.  As part of our continuing mission to provide you with exceptional heart care, we have created designated Provider Care Teams.  These Care Teams include your primary Cardiologist (physician) and Advanced Practice Providers (APPs -  Physician Assistants and Nurse Practitioners) who all work together to provide you with the care you need, when you need it.  We recommend signing up for the patient portal called "MyChart".  Sign up information is provided on this After Visit Summary.  MyChart is used to connect with patients for Virtual Visits (Telemedicine).  Patients are able to view lab/test results, encounter notes, upcoming appointments, etc.  Non-urgent messages can be sent to your provider as well.   To learn more about what you can do with MyChart, go to [email or web address].    Your next appointment:   1 month(s)  The format for your next appointment:   In Person  Provider:   Fransico Him, MD     Important Information About Sugar

## 2022-02-06 ENCOUNTER — Telehealth: Payer: Self-pay | Admitting: Nurse Practitioner

## 2022-02-06 LAB — CBC
Hematocrit: 41.1 % (ref 34.0–46.6)
Hemoglobin: 14.1 g/dL (ref 11.1–15.9)
MCH: 32.3 pg (ref 26.6–33.0)
MCHC: 34.3 g/dL (ref 31.5–35.7)
MCV: 94 fL (ref 79–97)
Platelets: 225 10*3/uL (ref 150–450)
RBC: 4.36 x10E6/uL (ref 3.77–5.28)
RDW: 12.4 % (ref 11.7–15.4)
WBC: 5.6 10*3/uL (ref 3.4–10.8)

## 2022-02-06 LAB — C-REACTIVE PROTEIN: CRP: 3 mg/L (ref 0–10)

## 2022-02-06 NOTE — Telephone Encounter (Signed)
Patient's spouse called back stating he is returning Patricia [name]'s call to discuss lab results and plan of action.

## 2022-02-09 NOTE — Telephone Encounter (Signed)
Attempted to return patient's call, left message for call back.

## 2022-02-09 NOTE — Progress Notes (Unsigned)
[facility] [address] [place]. [place] [address] [phone]       OFFICE FOLLOW-UP NOTE  [name] Date of Birth:  05-Nov-1940 Medical Record Number:  [id number]   HPI: Initial visit 03/27/2019 Patricia [name] is a 81 year old pleasant Caucasian lady with past medical history of left breast cancer with radiation, persistent atrial fibrillation on Eliquis, multinodular goiter, laryngeal spasms, essential hypertension, chronic kidney disease, diastolic heart failure, and hyperlipidemia who was last seen in office on 11/19/2016.  She is seen today for follow-up following recent ER visit on November 30, 2018.  She was doing well until 2 days prior when the husband sent her to another room at home to get her checkbook.  The patient recalled trying to move a rolling chair out of her way and suddenly she fell backwards leaning on the wall and slid down to the floor.  The husband arrived a few minutes later and noted that she was unresponsive and was not speaking or answering his questions.  He called EMS and by the time they arrived patient was gradually returning back to normal.  She could not remember the episode and did not have a memory of the event.  She denied any chest pain, sweating, palpitations, headache, tongue bite injury or incontinence.  CT scan of the head was unremarkable and MRI scan showed no evidence of acute stroke or hemorrhage.  EEG showed bitemporal focal slowing.  Patient was on Eliquis for atrial fibrillation and prior stroke which was continued.  She states she is done well since then and has not had any further episodes of office consciousness or seizures.  The patient's husband however feels that is noticed that her memory is not the same and she has had some cognitive decline.  There have been no episodes of confusion, disorientation or agitation.  She has not had any delusions hallucinations.  She has not had any lab work to look for reversible causes of  cognitive impairment.  She denies any headache, falls or injuries.  She feels her balance may be slightly off. Update 06/22/2019 : She returns for follow-up after last visit 6 months ago. She is accompanied by her husband. She states that him memory difficulties are about unchanged. She is started taking Prevagen 10 mg 1 capsule daily and tolerating it well. She has had no further episodes of confusion or seizure-like episode and she remains on Keppra XR 500 mg daily which is tolerating well without side effects. She has had no recurrent stroke or TIAs either. She remains on Eliquis and is tolerating it well with minor bruising but no bleeding. Blood pressure is well controlled though it is elevated today in office. She has been participating in mentally challenging activities and she does have family history of Alzheimer's and worries about it and wants me to try something else to help . Update 01/04/2020: She returns for follow-up after last visit 6 months ago.  She is accompanied by her husband.  She states she is doing well.  Subjective memory difficulties appear about unchanged.  She remains on provision as well as she started taking Cerefolin NAC and is tolerating it well.  She has had no episodes of seizure confusion.  She is tolerating Keppra XR 500 mg without any side effects.  She has had no recurrent stroke or TIAs either.  She remains on Eliquis which is tolerating well without bruising or bleeding.  Her blood pressures well controlled and usually in the 213 systolic range  and today it is 117/74 in office.  She has no new complaints. Update 07/15/2020 : She returns for follow-up after last visit 6 months ago.  She is accompanied by her husband.  Patient states she is doing well and the memory difficulties are unchanged however the husband feels that the short-term memory is getting worse.  However she remains quite independent in activities of daily living.  She just does not remember to write things  down.  She still ending most of her own affairs.  She remains on Prevagen as well as Cerefolin NAC.  She plays bridge regularly but is not doing any other cognitively challenging activities.  She has had no recurrent stroke or TIA symptoms.  She has had no other episodes of loss of consciousness or seizures either.  She remains on Eliquis which is tolerating well with only minor bruising and no bleeding.  Blood pressure is well controlled.  Dr. Radford Pax her cardiologist increase the dose of metoprolol and blood pressure today is 104/73 and heart rate is 58.  She also remains on Keppra XR 5 mg daily which she is tolerating well without side effects.  She did have follow-up carotid ultrasound done on 01/31/2020 which showed no significant extracranial stenosis.  She has no new complaints. Update 09/12/2020 : She returns for follow-up after last visit with me 2 months ago.  This appointment was made when the husband called requesting she be seen sooner.  She had an episode a week ago when she was to drive to her dentist and her husband program the address and the GPS and the GPS took her through a different route and she appeared confused and lost and missed her appointment.  She came home an hour later and.  Slightly disoriented and frustrated but quickly returned back to baseline.  She denied any headache, tongue bite, seizure-like activity.  She continues to have mild short-term memory difficulties but does do crossword puzzles and mentally challenging activities.  She has had no recurrent TIA or strokelike episodes or seizure-like episodes.  She remains on Keppra which is tolerating well without side effects.  She is also on Eliquis tolerating it well without bruising or bleeding.  Update 02/04/2021 JM: Returns for sooner scheduled visit per request of husband due to concern of worsening memory.  She was seen by Dr. Leonie Man 5 months ago.  She is accompanied today by her husband.  She believes her memory has been stable  but per husband, some decline more so with short-term memory such as remembering recent conversations or events. He has been trying to encourage her to write things down in a planner but is resistant to do so.  Able to maintain ADLs and majority of IADLs independently. Husband pays bills and she only drives short distance.  She will occasionally do memory exercises.  Denies depression or anxiety.  Continues on Prevagen and Cerefolin. MMSE today 24/30 (prior 27/30). Just received CPAP machine and trying to get used to using it.  No recurrent stroke/TIA symptoms or seizure-like episodes.  Compliant on Keppra -denies side effects.  Compliant on Eliquis for history of A. fib and stroke prevention without side effects.  Blood pressure stable at 115/81 today.  No further concerns at this time.  Update 08/06/2021 JM: Patient returns for 52-month follow-up accompanied by her husband.  Per patient, cognition has been stable although husband believes it has slightly declined. MMSE today 23/30 (prior 24/30).  She has remained on Namenda, Cerefolin and Prevagen, denies side  effects.  She plays bridge weekly, does not do any other type of memory exercise. Sleeps well, currently using CPAP machine.  She currently only drives short distance (1-2 mi from home) as she got lost a couple times driving long distance.  Continues to maintain ADLs independently.  Completed neurocognitive evaluation on 1/24 with testing suggestive of frontal-subcortical dysfunction as well as some indication of possible mesial temporal lobe dysfunction likely multifactoral with contribution from several vascular risk factors as well as signs of potential early Alzheimer's pathology.  As requested by neuropsych, completed MRI which showed moderate generalized atrophy and known right superior temporal ischemic infarct.    Denies any seizure activity and remains on Keppra, denies side effects.  Stable from stroke standpoint and remains on Eliquis without  side effects.  Blood pressure today 106/73. Occasionally monitors at home which has been stable.   Patient is currently being followed by oncology with history of early-stage breast cancer with more recent diagnosis of metastatic carcinoma, currently receiving radiation therapy to L5 vertebral body which was started on 5/8 and is to be completed tomorrow.  Has follow-up with oncology next week, per husband, she may not need to proceed chemo but they plan on discussing this at follow-up visit.  No further concerns at this time.   Update 02/10/2022 JM: Patient returns for 72-month follow-up accompanied by her husband.  Per husband, believes cognition has been relatively the same, can have good days and bad days Remains on Namenda, Cerefolin and Prevagen MMSE today 27/30 (prior 23/30) Continues to play weekly bridge, drive short distance/familiar places without issues  Denies any witnessed seizure activity Remains on Keppra  No new stroke/TIA symptoms. 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 Dx 04/2015 at time of stroke. b. DCCV 06/2015 - did not hold. Amio started then stopped due to QT prolongation.   Chronic cough    Chronic diastolic heart failure ([place]) 06/17/2015   CKD (chronic kidney disease), stage III (HCC)    Cough variant asthma    Disease of pharynx or nasopharynx    Diverticulosis    Enlargement of right atrium 01/23/2020   Essential hypertension    GERD (gastroesophageal reflux disease)    Glaucoma    Hiatal hernia    Hyperlipidemia    Internal  hemorrhoids    Laryngospasm    Mitral regurgitation    mild to moderate by echo 12/2019 and moderate by TEE 12/2020   Multinodular goiter    Osteopenia    Postmenopausal    Pulmonary HTN (HCC)     Moderate with PASP 5mmHg by echo 2018 likely Group 2 from pulmonary venous HTN from CHF>>normal PAP by echo 12/2019   Radiation 10/22/14-11/23/14   Left Breast   Stricture and stenosis of cervix     Social History:  Social History   Socioeconomic History   Marital status: Married    Spouse name: Patricia [name]   Number of children: 2   Years of education: Not on  file   Highest education level: High school graduate  Occupational History   Occupation: Retired  Tobacco Use   Smoking status: Former    Packs/day: 0.75    Years: 5.00    Total pack years: 3.75    Types: Cigarettes    Quit date: 06/15/1966    Years since quitting: 55.6    Passive exposure: Past   Smokeless tobacco: Never  Vaping Use   Vaping Use: Never used  Substance and Sexual Activity   Alcohol use: Not Currently    Comment: occasional wine   Drug use: No   Sexual activity: Not Currently    Birth control/protection: Post-menopausal  Other Topics Concern   Not on file  Social History Narrative   Retired. Lives with husband.    Right Handed   Drinks 1-2 cups caffeine daily   Social Determinants of Health   Financial Resource Strain: Low Risk  (10/09/2021)   Overall Financial Resource Strain (CARDIA)    Difficulty of Paying Living Expenses: Not hard at all  Food Insecurity: No Food Insecurity (10/09/2021)   Hunger Vital Sign    Worried About Running Out of Food in the Last Year: Never true    Ran Out of Food in the Last Year: Never true  Transportation Needs: No Transportation Needs (10/09/2021)   PRAPARE - Hydrologist (Medical): No    Lack of Transportation (Non-Medical): No  Physical Activity: Insufficiently Active (10/09/2021)   Exercise Vital Sign    Days of Exercise per Week: 3  days    Minutes of Exercise per Session: 30 min  Stress: No Stress Concern Present (10/09/2021)   [place]    Feeling of Stress : Not at all  Social Connections: [place] (10/09/2021)   Social Connection and Isolation Panel [NHANES]    Frequency of Communication with Friends and Family: More than three times a week    Frequency of Social Gatherings with Friends and Family: Three times a week    Attends Religious Services: More than 4 times per year    Active Member of Clubs or Organizations: Yes    Attends Archivist Meetings: More than 4 times per year    Marital Status: Married  Human resources officer Violence: Not At Risk (10/09/2021)   Humiliation, Afraid, Rape, and Kick questionnaire    Fear of Current or Ex-Partner: No    Emotionally Abused: No    Physically Abused: No    Sexually Abused: No    Medications:   Current Outpatient Medications on File Prior to Visit  Medication Sig Dispense Refill   ADVAIR HFA 230-21 MCG/ACT inhaler USE 2 INHALATIONS TWICE A DAY 36 g 3   albuterol (PROVENTIL HFA;VENTOLIN HFA) 108 (90 Base) MCG/ACT inhaler Inhale 2 puffs into the lungs every 6 (six) hours as needed for wheezing or shortness of breath.     apixaban (ELIQUIS) 5 MG TABS tablet TAKE 1 TABLET TWICE A DAY 180 tablet 3   Apoaequorin (PREVAGEN) 10 MG CAPS Take 1 capsule by mouth every morning. 90 capsule 3   ARMOUR THYROID 15 MG tablet TAKE 1 TABLET DAILY ALONG WITH 60 MG 90 tablet 3   ARMOUR THYROID 60 MG tablet TAKE 1 TABLET DAILY BEFORE BREAKFAST 90 tablet 3   atorvastatin (LIPITOR) 40 MG tablet Take 1 tablet (40 mg total) by mouth daily before supper. 90 tablet 3   bimatoprost (LUMIGAN) 0.01 % SOLN Place 1  drop into both eyes 2 (two) times daily.     calcium-vitamin D (OSCAL WITH D) 500-200 MG-UNIT tablet Take 1 tablet by mouth daily with breakfast. 30 tablet 0   diltiazem (CARDIZEM CD) 300 MG 24 hr capsule  Take 1 capsule (300 mg total) by mouth daily. Please keep scheduled appointment for future refills. Thank you. 90 capsule 0   dorzolamide-timolol (COSOPT) 22.3-6.8 MG/ML ophthalmic solution Place 1 drop into both eyes at bedtime.     fluticasone (FLONASE) 50 MCG/ACT nasal spray 1 spray each nostril following sinus rinses twice daily 16 g 2   furosemide (LASIX) 40 MG tablet Take 1 tablet (40 mg total) by mouth 2 (two) times daily. 180 tablet 3   gabapentin (NEURONTIN) 400 MG capsule TAKE 1 CAPSULE TWICE A DAY 180 capsule 3   L-Methylfolate-B12-B6-B2 (CEREFOLIN) 07-31-48-5 MG TABS TAKE 1 TABLET BY MOUTH EVERY MORNING 90 tablet 3   levETIRAcetam (KEPPRA XR) 500 MG 24 hr tablet Take 1 tablet (500 mg total) by mouth at bedtime. 90 tablet 3   loratadine (CLARITIN) 10 MG tablet Take 10 mg by mouth daily.     memantine (NAMENDA) 10 MG tablet Take 1 tablet (10 mg total) by mouth 2 (two) times daily. 180 tablet 3   metoprolol succinate (TOPROL-XL) 50 MG 24 hr tablet Take 1.5 tablets (75 mg total) by mouth daily. Take with or immediately following a meal. 135 tablet 2   Multiple Vitamins-Iron (MULTIVITAMIN/IRON) TABS Take 1 tablet by mouth daily.     Netarsudil Dimesylate (RHOPRESSA) 0.02 % SOLN Place 1 drop into both eyes at bedtime.     pantoprazole (PROTONIX) 40 MG tablet TAKE 1 TABLET DAILY 90 tablet 3   potassium chloride SA (KLOR-CON M) 20 MEQ tablet TAKE 1 TABLET TWICE A DAY 180 tablet 3   traMADol (ULTRAM) 50 MG tablet Take 1 tablet (50 mg total) by mouth every 6 (six) hours as needed. 60 tablet 0   Vitamin D, Ergocalciferol, (DRISDOL) 1.25 MG (50000 UNIT) CAPS capsule TAKE 1 CAPSULE WEEKLY 12 capsule 3   No current facility-administered medications on file prior to visit.    Allergies:   Allergies  Allergen Reactions   Combigan [Brimonidine Tartrate-Timolol] Itching    Eyes itch, reddened   Other Other (See Comments)    Per patient made OU red, Sore, and sensitivity to light   Sulfa  Antibiotics Rash   Sulfamethoxazole-Trimethoprim Hives    Physical Exam Today's Vitals   02/10/22 0758  BP: 115/67  Pulse: 94  Weight: 153 lb 3.2 oz (69.5 kg)  Height: 5' (1.524 m)   Body mass index is 29.92 kg/m.  General: Petite elderly very pleasant Caucasian lady, seated, in no evident distress Head: head normocephalic and atraumatic.  Neck: supple with no carotid or supraclavicular bruits Cardiovascular: irregular rate and rhythm, no murmurs Musculoskeletal: Mild kyphoscoliosis Skin:  no rash/petichiae Vascular:  Normal pulses all extremities  Neurologic Exam Mental Status: Awake and fully alert.  Fluent speech and language.  Recent memory impaired and remote memory intact. Attention span, concentration and fund of knowledge slightly diminished. Mood and affect appropriate.      02/10/2022    8:02 AM 08/07/2021    8:35 AM 02/04/2021    8:29 AM  MMSE - Mini Mental State Exam  Orientation to time 2 0 4  Orientation to Place 5 4 4   Registration 3 3 3   Attention/ Calculation 5 5 2   Recall 3 2 2   Language- name 2 objects  2 2 2   Language- repeat 1 1 1   Language- follow 3 step command 3 3 3   Language- read & follow direction 1 1 1   Write a sentence 1 1 1   Copy design 1 1 1   Total score 27 23 24    Cranial Nerves: Pupils equal, briskly reactive to light. Extraocular movements full without nystagmus. Visual fields full to confrontation. HOH bilaterally. Facial sensation intact. Face, tongue, palate moves normally and symmetrically.  Motor: Normal bulk and tone. Normal strength in all tested extremity muscles. Sensory.: intact to touch ,pinprick .position and vibratory sensation.  Coordination: Rapid alternating movements normal in all extremities. Finger-to-nose and heel-to-shin performed accurately bilaterally. Gait and Station: Arises from chair without difficulty. Stance is normal. Gait demonstrates normal stride length and balance without use of assistive device Reflexes:  1+ and symmetric. Toes downgoing.        ASSESSMENT: 81 year old Caucasian lady with remote history of right MCA infarct from atrial fibrillation in February 2017.  Episode in 11/2018 of brief loss of consciousness followed by confusion possibly unwitnessed seizure with postictal confusion.  Also mild cognitive impairment with continued gradual decline of short-term memory. Episode of transient confusion and disorientation likely related to her baseline mild cognitive impairment doubt seizure in 08/2020.  Neurocognitive evaluation 03/2021 frontal-subcortical dysfunction and some indication of possible mesial temporal lobe dysfunction with possible etiology multifactorial with combination of several vascular risk factors and signs of potential early Alzheimer's pathology. 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  Continue Eliquis for stroke prevention with atrial fibrillation managed by cardiology    Follow-up in 6 months or call earlier if needed    CC:  [name], Maritza, PA-C   I spent 33 minutes of face-to-face and non-face-to-face time with patient and husband.  This included previsit chart review, lab review, study review, order entry, electronic health record documentation, patient and husband education and discussion regarding the above and answered all questions to patient and husband  satisfaction  Frann Rider, AGNP-BC  [facility] [address] [place], [place] [address]  Phone [phone] Fax [phone] Note: This document was prepared with digital dictation and possible smart phrase technology. Any transcriptional errors that result from this process are unintentional.

## 2022-02-09 NOTE — Telephone Encounter (Signed)
Spoke with patient and husband regarding TEE. They would like to hold off on scheduling any procedures at this time. Per husband, patient has a PET scan in a few weeks and this would assist them in making further decisions. They thanked me for the call.

## 2022-02-10 ENCOUNTER — Ambulatory Visit (INDEPENDENT_AMBULATORY_CARE_PROVIDER_SITE_OTHER): Payer: Medicare Other | Admitting: Adult Health

## 2022-02-10 ENCOUNTER — Encounter: Payer: Self-pay | Admitting: Adult Health

## 2022-02-10 VITALS — BP 115/67 | HR 94 | Ht 60.0 in | Wt 153.2 lb

## 2022-02-10 DIAGNOSIS — G40909 Epilepsy, unspecified, not intractable, without status epilepticus: Secondary | ICD-10-CM

## 2022-02-10 DIAGNOSIS — Z8673 Personal history of transient ischemic attack (TIA), and cerebral infarction without residual deficits: Secondary | ICD-10-CM

## 2022-02-10 DIAGNOSIS — F01A0 Vascular dementia, mild, without behavioral disturbance, psychotic disturbance, mood disturbance, and anxiety: Secondary | ICD-10-CM | POA: Diagnosis not present

## 2022-02-10 MED ORDER — CEREFOLIN 6-1-50-5 MG PO TABS: 1.0000 | ORAL_TABLET | Freq: Every morning | ORAL | 3 refills | Status: DC

## 2022-02-10 NOTE — Patient Instructions (Addendum)
Your Plan:  Continue Keppra at current dosage for seizure prevention  Continue Namenda 10 mg twice daily and Cerefolin for cognition/memory  Continue to follow closely with your PCP and cardiologist for aggressive stroke risk factor management    Follow-up in 6 months with Dr. Leonie Man or call earlier if needed     Thank you for coming to see [name] at [facility]. I hope we have been able to provide you high quality care today.  You may receive a patient satisfaction survey over the next few weeks. We would appreciate your feedback and comments so that we may continue to improve ourselves and the health of our patients.

## 2022-02-26 ENCOUNTER — Other Ambulatory Visit: Payer: Self-pay | Admitting: Nurse Practitioner

## 2022-02-26 DIAGNOSIS — K219 Gastro-esophageal reflux disease without esophagitis: Secondary | ICD-10-CM

## 2022-03-04 ENCOUNTER — Encounter: Payer: Self-pay | Admitting: *Deleted

## 2022-03-04 NOTE — Progress Notes (Unsigned)
PET scheduled for 03/26/22.  Patient is aware of PET appointment including date, time, and location. The following prep is reviewed with patient and confirmed with teachback: - arrive 30 minutes before appointment time - NPO except water for 6h before scan. No candy, no gum - hold any diabetic medication the morning of the scan - have a low carb dinner the night prior Radiology Information sheet also mailed to patient's home for reinforcement of education.  Oncology Nurse Navigator Documentation     03/04/2022    9:00 AM  Oncology Nurse Navigator Flowsheets  Navigator Follow Up Date: 03/26/2022  Navigator Follow Up Reason: Scan Review  Navigator Location [facility]  Navigator Encounter Type Appt/Treatment Plan Review;Telephone  Telephone Education;Appt Confirmation/Clarification;Outgoing Call  Patient Visit Type MedOnc  Treatment Phase Active Tx  Barriers/Navigation Needs Coordination of Care  Interventions Coordination of Care;Education  Acuity Level 1-No Barriers  Coordination of Care Appts  Education Method Verbal;Teach-back;Written  Support Groups/Services Friends and Family  Time Spent with Patient 30

## 2022-03-05 ENCOUNTER — Encounter: Payer: Self-pay | Admitting: Hematology & Oncology

## 2022-03-09 DIAGNOSIS — H401133 Primary open-angle glaucoma, bilateral, severe stage: Secondary | ICD-10-CM | POA: Diagnosis not present

## 2022-03-16 ENCOUNTER — Other Ambulatory Visit: Payer: Self-pay | Admitting: Cardiology

## 2022-03-23 DIAGNOSIS — M5416 Radiculopathy, lumbar region: Secondary | ICD-10-CM | POA: Diagnosis not present

## 2022-03-23 DIAGNOSIS — Z6829 Body mass index (BMI) 29.0-29.9, adult: Secondary | ICD-10-CM | POA: Diagnosis not present

## 2022-03-26 ENCOUNTER — Encounter: Payer: Self-pay | Admitting: *Deleted

## 2022-03-26 ENCOUNTER — Encounter (HOSPITAL_COMMUNITY)
Admission: RE | Admit: 2022-03-26 | Discharge: 2022-03-26 | Disposition: A | Discharge status: Home / Self Care | Payer: Medicare Other | Source: Ambulatory Visit | Attending: Hematology & Oncology | Admitting: Hematology & Oncology

## 2022-03-26 DIAGNOSIS — Z170 Estrogen receptor positive status [ER+]: Secondary | ICD-10-CM | POA: Insufficient documentation

## 2022-03-26 DIAGNOSIS — K5730 Diverticulosis of large intestine without perforation or abscess without bleeding: Secondary | ICD-10-CM | POA: Diagnosis not present

## 2022-03-26 DIAGNOSIS — C50412 Malignant neoplasm of upper-outer quadrant of left female breast: Secondary | ICD-10-CM | POA: Diagnosis not present

## 2022-03-26 DIAGNOSIS — D179 Benign lipomatous neoplasm, unspecified: Secondary | ICD-10-CM | POA: Diagnosis not present

## 2022-03-26 DIAGNOSIS — C7951 Secondary malignant neoplasm of bone: Secondary | ICD-10-CM | POA: Diagnosis not present

## 2022-03-26 DIAGNOSIS — J90 Pleural effusion, not elsewhere classified: Secondary | ICD-10-CM | POA: Diagnosis not present

## 2022-03-26 LAB — GLUCOSE, CAPILLARY: Glucose-Capillary: 118 mg/dL — ABNORMAL HIGH (ref 70–99)

## 2022-03-26 MED ORDER — FLUDEOXYGLUCOSE F - 18 (FDG) INJECTION
7.7000 | Freq: Once | INTRAVENOUS | Status: AC
  Administered 2022-03-26: 7.64 via INTRAVENOUS

## 2022-03-27 ENCOUNTER — Encounter: Payer: Self-pay | Admitting: *Deleted

## 2022-03-27 NOTE — Progress Notes (Signed)
Reviewed PET which shows stable disease with some areas of improvement.   Oncology Nurse Navigator Documentation     03/27/2022    7:45 AM  Oncology Nurse Navigator Flowsheets  Navigator Follow Up Date: 03/31/2022  Navigator Follow Up Reason: Follow-up Appointment  Navigator Location [facility]  Navigator Encounter Type Scan Review  Patient Visit Type MedOnc  Treatment Phase Active Tx  Barriers/Navigation Needs Coordination of Care  Interventions None Required  Acuity Level 1-No Barriers  Support Groups/Services Friends and Family  Time Spent with Patient 15

## 2022-03-30 ENCOUNTER — Encounter: Payer: Self-pay | Admitting: Cardiology

## 2022-03-30 ENCOUNTER — Ambulatory Visit: Payer: Medicare Other | Attending: Cardiology | Admitting: Cardiology

## 2022-03-30 VITALS — BP 116/62 | HR 76 | Ht 60.0 in | Wt 144.0 lb

## 2022-03-30 DIAGNOSIS — I351 Nonrheumatic aortic (valve) insufficiency: Secondary | ICD-10-CM | POA: Insufficient documentation

## 2022-03-30 DIAGNOSIS — I340 Nonrheumatic mitral (valve) insufficiency: Secondary | ICD-10-CM | POA: Insufficient documentation

## 2022-03-30 DIAGNOSIS — R0602 Shortness of breath: Secondary | ICD-10-CM | POA: Diagnosis not present

## 2022-03-30 DIAGNOSIS — I10 Essential (primary) hypertension: Secondary | ICD-10-CM | POA: Diagnosis not present

## 2022-03-30 DIAGNOSIS — G4733 Obstructive sleep apnea (adult) (pediatric): Secondary | ICD-10-CM | POA: Insufficient documentation

## 2022-03-30 DIAGNOSIS — I5032 Chronic diastolic (congestive) heart failure: Secondary | ICD-10-CM | POA: Diagnosis not present

## 2022-03-30 DIAGNOSIS — I4821 Permanent atrial fibrillation: Secondary | ICD-10-CM | POA: Insufficient documentation

## 2022-03-30 DIAGNOSIS — I272 Pulmonary hypertension, unspecified: Secondary | ICD-10-CM | POA: Diagnosis not present

## 2022-03-30 NOTE — Patient Instructions (Signed)
Medication Instructions:  Your physician recommends that you continue on your current medications as directed. Please refer to the Current Medication list given to you today.  *If you need a refill on your cardiac medications before your next appointment, please call your pharmacy*   Lab Work: None.  If you have labs (blood work) drawn today and your tests are completely normal, you will receive your results only by: [place] (if you have MyChart) OR A paper copy in the mail If you have any lab test that is abnormal or we need to change your treatment, we will call you to review the results.   Testing/Procedures: None.   Follow-Up: At [facility], you and your health needs are our priority.  As part of our continuing mission to provide you with exceptional heart care, we have created designated Provider Care Teams.  These Care Teams include your primary Cardiologist (physician) and Advanced Practice Providers (APPs -  Physician Assistants and Nurse Practitioners) who all work together to provide you with the care you need, when you need it.  We recommend signing up for the patient portal called "MyChart".  Sign up information is provided on this After Visit Summary.  MyChart is used to connect with patients for Virtual Visits (Telemedicine).  Patients are able to view lab/test results, encounter notes, upcoming appointments, etc.  Non-urgent messages can be sent to your provider as well.   To learn more about what you can do with MyChart, go to [email or web address].    Your next appointment:   1 year(s)  Provider:   Fransico Him, MD     Other Instructions A member of our sleep team will be calling to get you set up with a new DME company. You need an appointment for a mask fitting. A member of our sleep team will also be getting a download from  your device in 2 months.

## 2022-03-30 NOTE — Progress Notes (Signed)
Cardiology Office Note:    Date:  03/30/2022   ID:  Patricia [name], DOB May 09, 1940, MRN [id number]  PCP:  Lorrene Reid, PA-C  Cardiologist:  Fransico Him, MD   Referring MD: Lorrene Reid, PA-C   Chief Complaint  Patient presents with   Atrial Fibrillation   Congestive Heart Failure   Aortic Insuffiency   Hypertension    History of Present Illness:    Patricia [name] is a 82 y.o. female with history of chronic AFib on Eliquis (amiodarone stopped because of prolonged QT), CVA, chronic diastolic CHF, mild  AI, normal NST 2017, lung CA resection 2003, breast CA s/p lumpectomy/Tamoxifen/XRT, HTN, HLD.  2D echo in 2018 showed moderate PHTN with PASP 80mmHg and RHC 04/2016 with borderline pulmonary HTN.  Repeat 2D echo 12/2019 showed normal LVF with mild to moderate MR and normal PASP.  She is followed in afib clinic and now permanent atrial fibrillation.    She underwent sleep study in Nov 2021 and was found to have mild OSA with an AHI of 9.6/hr with no central apneas and underwent CPAP titration but could not be adequately treated due to ongoing events and underwent BiPAP titration and was ultimately placed on BIPAP.   She recently had a 2D echocardiogram done for follow-up of her mitral regurgitation which showed normal LV size and systolic function, mild RV enlargement and moderate right atrial enlargement as well as moderate to severe MR and TR which are new.  She underwent TEE on 01/07/2021 showing low normal LV function with EF 50 to 55% with severe left atrial enlargement, severe right atrial enlargement and moderate to severe mitral regurgitation with myxomatous tricuspid valve and moderate TR.   She was referred to structural heart clinic and was seen by Dr. Burt Knack who felt she had moderate to severe 3+ mitral regurgitation due to a combination of atrial functional MR with marked left atrial enlargement as well as degenerative mitral regurgitation with severe calcification of the P2  scallop of the posterior leaflet.  It was felt she was not a candidate for transcatheter therapies because of severe posterior leaflet calcification.  Ongoing medical therapy was recommended at that time given lack of significant symptoms.  She has been continued on annual echo reassessments.  She is here today for followup and is doing well.  She denies any chest pain or pressure, SOB, DOE, PND, orthopnea,  dizziness, palpitations or syncope. Occasionally she will have some mild LE edema.  She is compliant with her meds and is tolerating meds with no SE.    Since I saw her last we changed her to auto BiPAP.  She is doing well with her PAP device and thinks that she has gotten used to it.  She tolerates the mask and feels the pressure is adequate.  Since going on PAP she feels rested in the am and has no significant daytime sleepiness.  She denies any significant mouth or nasal dryness or nasal congestion.  She does not think that he snores.    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 Dx 04/2015 at time of stroke. b. DCCV 06/2015 - did not hold. Amio started then stopped due to QT prolongation.   Chronic  cough    Chronic diastolic heart failure ([place]) 06/17/2015   CKD (chronic kidney disease), stage III (HCC)    Cough variant asthma    Disease of pharynx or nasopharynx    Diverticulosis    Enlargement of right atrium 01/23/2020   Essential hypertension    GERD (gastroesophageal reflux disease)    Glaucoma    Hiatal hernia    Hyperlipidemia    Internal hemorrhoids    Laryngospasm    Mitral regurgitation    mild to moderate by echo 12/2019 and moderate by TEE 12/2020   Multinodular goiter    Osteopenia    Postmenopausal    Pulmonary HTN (HCC)     Moderate with PASP  41mmHg by echo 2018 likely Group 2 from pulmonary venous HTN from CHF>>normal PAP by echo 12/2019   Radiation 10/22/14-11/23/14   Left Breast   Stricture and stenosis of cervix     Past Surgical History:  Procedure Laterality Date   BREAST LUMPECTOMY Left    BREAST SURGERY     CARDIOVERSION N/A 06/24/2015   Procedure: CARDIOVERSION;  Surgeon: Sueanne Margarita, MD;  Location: [place];  Service: Cardiovascular;  Laterality: N/A;   CARDIOVERSION N/A 05/19/2016   Procedure: CARDIOVERSION;  Surgeon: Sueanne Margarita, MD;  Location: [facility];  Service: Cardiovascular;  Laterality: N/A;   COLONOSCOPY     LUNG CANCER SURGERY  2003   resection carcinoid lingula-lt upper lobe   RADIOACTIVE SEED GUIDED PARTIAL MASTECTOMY WITH AXILLARY SENTINEL LYMPH NODE BIOPSY Left 06/26/2014   Procedure: RADIOACTIVE SEED GUIDED PARTIAL MASTECTOMY WITH AXILLARY SENTINEL LYMPH NODE BIOPSY;  Surgeon: Stark Klein, MD;  Location: [place];  Service: General;  Laterality: Left;   RE-EXCISION OF BREAST LUMPECTOMY Left 08/07/2014   Procedure: RE-EXCISION OF LEFT BREAST LUMPECTOMY;  Surgeon: Stark Klein, MD;  Location: [place];  Service: General;  Laterality: Left;   RIGHT HEART CATH N/A 04/27/2016   Procedure: Right Heart Cath;  Surgeon: Larey Dresser, MD;  Location: [place] [facility];  Service: Cardiovascular;  Laterality: N/A;   TEE WITHOUT CARDIOVERSION N/A 01/07/2021   Procedure: TRANSESOPHAGEAL ECHOCARDIOGRAM (TEE);  Surgeon: Thayer Headings, MD;  [facility];  Service: Cardiovascular;  Laterality: N/A;    Current Medications: Current Meds  Medication Sig   ADVAIR HFA 230-21 MCG/ACT inhaler USE 2 INHALATIONS TWICE A DAY   albuterol (PROVENTIL HFA;VENTOLIN HFA) 108 (90 Base) MCG/ACT inhaler Inhale 2 puffs into the lungs every 6 (six) hours as needed for wheezing or shortness of breath.   apixaban (ELIQUIS) 5 MG TABS tablet TAKE 1 TABLET TWICE A DAY   Apoaequorin  (PREVAGEN) 10 MG CAPS Take 1 capsule by mouth every morning.   ARMOUR THYROID 15 MG tablet TAKE 1 TABLET DAILY ALONG WITH 60 MG   ARMOUR THYROID 60 MG tablet TAKE 1 TABLET DAILY BEFORE BREAKFAST   atorvastatin (LIPITOR) 40 MG tablet Take 1 tablet (40 mg total) by mouth daily before supper.   bimatoprost (LUMIGAN) 0.01 % SOLN Place 1 drop into both eyes 2 (two) times daily.   calcium-vitamin D (OSCAL WITH D) 500-200 MG-UNIT tablet Take 1 tablet by mouth daily with breakfast.   diltiazem (CARDIZEM CD) 300 MG 24 hr capsule Take 1 capsule (300 mg total) by mouth daily.   dorzolamide-timolol (COSOPT) 22.3-6.8 MG/ML ophthalmic solution Place 1 drop into both eyes at bedtime.   fluticasone (FLONASE) 50 MCG/ACT nasal spray 1 spray each nostril following sinus rinses twice daily  furosemide (LASIX) 40 MG tablet Take 1 tablet (40 mg total) by mouth 2 (two) times daily.   gabapentin (NEURONTIN) 400 MG capsule TAKE 1 CAPSULE TWICE A DAY   L-Methylfolate-B12-B6-B2 (CEREFOLIN) 07-31-48-5 MG TABS Take 1 tablet by mouth every morning.   levETIRAcetam (KEPPRA XR) 500 MG 24 hr tablet Take 1 tablet (500 mg total) by mouth at bedtime.   loratadine (CLARITIN) 10 MG tablet Take 10 mg by mouth daily.   memantine (NAMENDA) 10 MG tablet Take 1 tablet (10 mg total) by mouth 2 (two) times daily.   metoprolol succinate (TOPROL-XL) 50 MG 24 hr tablet Take 1.5 tablets (75 mg total) by mouth daily. Take with or immediately following a meal.   Multiple Vitamins-Iron (MULTIVITAMIN/IRON) TABS Take 1 tablet by mouth daily.   Netarsudil Dimesylate (RHOPRESSA) 0.02 % SOLN Place 1 drop into both eyes at bedtime.   pantoprazole (PROTONIX) 40 MG tablet TAKE 1 TABLET DAILY   potassium chloride SA (KLOR-CON M) 20 MEQ tablet TAKE 1 TABLET TWICE A DAY   traMADol (ULTRAM) 50 MG tablet Take 1 tablet (50 mg total) by mouth every 6 (six) hours as needed.   Vitamin D, Ergocalciferol, (DRISDOL) 1.25 MG (50000 UNIT) CAPS capsule TAKE 1 CAPSULE  WEEKLY     Allergies:   Combigan [brimonidine tartrate-timolol], Other, Sulfa antibiotics, and Sulfamethoxazole-trimethoprim   Social History   Socioeconomic History   Marital status: Married    Spouse name: Charles   Number of children: 2   Years of education: Not on file   Highest education level: High school graduate  Occupational History   Occupation: Retired  Tobacco Use   Smoking status: Former    Packs/day: 0.75    Years: 5.00    Total pack years: 3.75    Types: Cigarettes    Quit date: 06/15/1966    Years since quitting: 55.8    Passive exposure: Past   Smokeless tobacco: Never  Vaping Use   Vaping Use: Never used  Substance and Sexual Activity   Alcohol use: Not Currently    Comment: occasional wine   Drug use: No   Sexual activity: Not Currently    Birth control/protection: Post-menopausal  Other Topics Concern   Not on file  Social History Narrative   Retired. Lives with husband.    Right Handed   Drinks 1-2 cups caffeine daily   Social Determinants of Health   Financial Resource Strain: Low Risk  (10/09/2021)   Overall Financial Resource Strain (CARDIA)    Difficulty of Paying Living Expenses: Not hard at all  Food Insecurity: No Food Insecurity (10/09/2021)   Hunger Vital Sign    Worried About Running Out of Food in the Last Year: Never true    Ran Out of Food in the Last Year: Never true  Transportation Needs: No Transportation Needs (10/09/2021)   PRAPARE - Hydrologist (Medical): No    Lack of Transportation (Non-Medical): No  Physical Activity: Insufficiently Active (10/09/2021)   Exercise Vital Sign    Days of Exercise per Week: 3 days    Minutes of Exercise per Session: 30 min  Stress: No Stress Concern Present (10/09/2021)   [place]    Feeling of Stress : Not at all  Social Connections: [place] (10/09/2021)   Social Connection and  Isolation Panel [NHANES]    Frequency of Communication with Friends and Family: More than three times a week  Frequency of Social Gatherings with Friends and Family: Three times a week    Attends Religious Services: More than 4 times per year    Active Member of Clubs or Organizations: Yes    Attends Music therapist: More than 4 times per year    Marital Status: Married     Family History: The patient's family history includes Atrial fibrillation in her son; Hyperlipidemia in her father and mother; Hypertension in her father and mother; Stroke in her father and mother; Transient ischemic attack in her father. There is no history of Heart attack.  ROS:   Please see the history of present illness.    ROS  All other systems reviewed and negative.   EKGs/Labs/Other Studies Reviewed:    The following studies were reviewed today:  2D echo 12/2019 IMPRESSIONS    1. Left ventricular ejection fraction, by estimation, is 55 to 60%. The  left ventricle has normal function. The left ventricle has no regional  wall motion abnormalities. There is mild left ventricular hypertrophy.  Left ventricular diastolic parameters  are indeterminate.   2. Right ventricular systolic function is mildly reduced. The right  ventricular size is normal. There is mildly elevated pulmonary artery  systolic pressure.   3. Left atrial size was severely dilated.   4. Right atrial size was severely dilated.   5. The mitral valve is normal in structure. Mild to moderate mitral valve  regurgitation. ERO 0.19 cm^2, RV 23 cc. Moderate mitral annular  calcification.   6. Tricuspid valve regurgitation is moderate.   7. The aortic valve is tricuspid. Aortic valve regurgitation is mild.  Mild to moderate aortic valve sclerosis/calcification is present, without  any evidence of aortic stenosis.   2D echo 11/2020 IMPRESSIONS    1. Left ventricular ejection fraction, by estimation, is 60 to 65%. The   left ventricle has normal function. The left ventricle has no regional  wall motion abnormalities. Left ventricular diastolic function could not  be evaluated. There is the  interventricular septum is flattened in systole and diastole, consistent  with right ventricular pressure and volume overload.   2. Right ventricular systolic function is mildly reduced. The right  ventricular size is mildly enlarged. There is moderately elevated  pulmonary artery systolic pressure. The estimated right ventricular  systolic pressure is 26.7 mmHg.   3. Left atrial size was severely dilated.   4. Right atrial size was severely dilated.   5. The mitral valve is degenerative. Moderate to severe mitral valve  regurgitation. No evidence of mitral stenosis.   6. The tricuspid valve is abnormal. Tricuspid valve regurgitation is  moderate to severe.   7. The aortic valve is tricuspid. Aortic valve regurgitation is mild. No  aortic stenosis is present. Aortic regurgitation PHT measures 884 msec.   8. The inferior vena cava is normal in size with greater than 50%  respiratory variability, suggesting right atrial pressure of 3 mmHg.   Comparison(s): Changes from prior study are noted. MR/TR appear to be  moderate to severe on this study. LV function unchanged. AI remains mild.   EKG:  EKG is not ordered today  Recent Labs: 10/09/2021: TSH 2.690 01/27/2022: ALT 14; BUN 22; Creatinine 1.25; Potassium 4.1; Sodium 142 02/05/2022: Hemoglobin 14.1; Platelets 225   Recent Lipid Panel    Component Value Date/Time   CHOL 134 09/30/2020 0831   TRIG 49 09/30/2020 0831   TRIG 52 01/13/2006 1012   HDL 61 09/30/2020 0831   CHOLHDL  2.2 09/30/2020 0831   CHOLHDL 3 04/01/2016 0952   VLDL 19.2 04/01/2016 0952   LDLCALC 62 09/30/2020 0831   LDLDIRECT 64 10/09/2021 1056   LDLDIRECT 174.3 06/04/2009 0000    Physical Exam:    VS:  BP 116/62   Pulse 76   Ht 5' (1.524 m)   Wt 144 lb (65.3 kg)   SpO2 95%   BMI  28.12 kg/m     Wt Readings from Last 3 Encounters:  03/30/22 144 lb (65.3 kg)  02/10/22 153 lb 3.2 oz (69.5 kg)  02/05/22 151 lb 6.4 oz (68.7 kg)    GEN: Well nourished, well developed in no acute distress HEENT: Normal NECK: No JVD; No carotid bruits LYMPHATICS: No lymphadenopathy CARDIAC:RRR, no murmurs, rubs, gallops RESPIRATORY:  Clear to auscultation without rales, wheezing or rhonchi  ABDOMEN: Soft, non-tender, non-distended MUSCULOSKELETAL:  No edema; No deformity  SKIN: Warm and dry NEUROLOGIC:  Alert and oriented x 3 PSYCHIATRIC:  Normal affect   ASSESSMENT:    1. Chronic diastolic heart failure ([place])   2. Nonrheumatic aortic valve insufficiency   3. Essential hypertension   4. Permanent atrial fibrillation ([place])   5. Pulmonary HTN ([place])   6. Nonrheumatic mitral valve regurgitation   7. OSA (obstructive sleep apnea)   8. SOB (shortness of breath)    PLAN:    In order of problems listed above:  1.  Chronic diastolic CHF  -She appears euvolemic on exam -I have personally reviewed and interpreted outside labs performed by patient's PCP which showed serum creatinine 1.25 and potassium 5.1 on 01/27/2022 -Continue prescription drug management with Lasix 40 mg twice daily and Toprol-XL 75 mg daily with as needed refills -I have personally reviewed and interpreted outside labs performed by patient's PCP which showed creatinine 1.25 and potassium 4.1 01/27/2022  2.  Aortic insufficiency -mild AI by echo 11/2020 -Moderate by echo 12/2021 -Repeat echo 01/2023  3.  HTN  -BP is adequately controlled on exam -Continue prescription drug with Cardizem CD 300 mg daily, Toprol-XL 75 mg daily with as needed refills  4.  Permanent atrial fibrillation  -she did not hold in NSR after DCCV due to severely dilated LA -Her LA is severely dilated so not a candidate for ablation.   -She did not tolerate amio due to prolonged QT so likely would happen with other Class III AAD.    -Her heart rate is adequately controlled on exam today and she denies any palpitations -No bleeding problems on DOAC -Continue with drug management with apixaban 5 mg twice daily, Cardizem CD 300mg  daily and Toprol-XL 75 mg daily with as needed refills  -I have personally reviewed and interpreted outside labs performed by patient's PCP which showed hemoglobin of 14.4 01/27/2022  5.  Pulmonary HTN  - RHC with no significant pulmonary HTN with mean PAP 36mmHg and PCWP elevated at 4mmHg in the setting of afib.   -2D echo 12/2019 with no PHTN but showed mildly reduced RV function and mild right ventricular enlargement as well as moderate pulmonary hypertension moderate to severe MR as well as TR. PASP 46.9 mmHg -2D echo 01/19/2022 showed PASP 31 mmHg with mild RV dysfunction -Continue diuretic therapy  6.  Mitral Regurgitation -mild to moderate by echo 12/2019 and moderate to severe by echo 11/2020 -TEE confirmed moderate to severe mitral regurgitation but EF appeared to be slightly reduced at 50 to 55%. -She was evaluated by Dr. Burt Knack in structural heart clinic and felt to have  a combination of atrial functional MR with moderate left atrial enlargement as well as degenerative mitral regurgitation with severe calcification of the P2 scallop of the posterior leaflet.  Medical therapy ongoing was recommended given her low NYHA functional class I-II symptoms with fatigue. -She was not felt to be a candidate for transcatheter therapies such as edge-to-edge repair of the mitral valve due to posterior leaflet calcification that was severe.  Only options if needed would be clinical trial devices or cardiac surgical repair or replacement. -2D echo 12/2021 showed moderate mitral regurgitation -Repeat echo in 1 year  7.  OSA - The patient is tolerating PAP therapy well without any problems. The PAP download performed by his DME was personally reviewed and interpreted by me today and showed an AHI of 12.3/hr  on auto BiPAP with 70% compliance in using more than 4 hours nightly.  The patient has been using and benefiting from PAP use and will continue to benefit from therapy.  -It does appear that she has a mask leak.  -I am going to get her a appointment with a new DME as her last DME was [facility] and have not gotten set up with a new one.  I will make an appointment for her to see the DME for mask fit and then repeat a download in 8 weeks   8.  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  Concerns regarding medicines are outlined above.  No orders of the defined types were placed in this encounter.   No orders of the defined types were placed in this encounter.    Signed, Fransico Him, MD  03/30/2022 2:40 PM    [place]

## 2022-03-31 ENCOUNTER — Encounter: Payer: Self-pay | Admitting: *Deleted

## 2022-03-31 ENCOUNTER — Other Ambulatory Visit: Payer: Self-pay

## 2022-03-31 ENCOUNTER — Ambulatory Visit (HOSPITAL_BASED_OUTPATIENT_CLINIC_OR_DEPARTMENT_OTHER)
Admission: RE | Admit: 2022-03-31 | Discharge: 2022-03-31 | Disposition: A | Discharge status: Home / Self Care | Payer: Medicare Other | Source: Ambulatory Visit | Attending: Hematology & Oncology | Admitting: Hematology & Oncology

## 2022-03-31 ENCOUNTER — Inpatient Hospital Stay (HOSPITAL_BASED_OUTPATIENT_CLINIC_OR_DEPARTMENT_OTHER): Payer: Medicare Other | Admitting: Hematology & Oncology

## 2022-03-31 ENCOUNTER — Inpatient Hospital Stay: Payer: Medicare Other | Attending: Hematology & Oncology

## 2022-03-31 ENCOUNTER — Inpatient Hospital Stay: Payer: Medicare Other

## 2022-03-31 ENCOUNTER — Encounter: Payer: Self-pay | Admitting: Hematology & Oncology

## 2022-03-31 ENCOUNTER — Telehealth: Payer: Self-pay | Admitting: *Deleted

## 2022-03-31 VITALS — BP 101/84 | HR 75 | Temp 98.6°F | Resp 18 | Ht 60.0 in | Wt 146.0 lb

## 2022-03-31 DIAGNOSIS — Z170 Estrogen receptor positive status [ER+]: Secondary | ICD-10-CM

## 2022-03-31 DIAGNOSIS — C50412 Malignant neoplasm of upper-outer quadrant of left female breast: Secondary | ICD-10-CM

## 2022-03-31 DIAGNOSIS — M8580 Other specified disorders of bone density and structure, unspecified site: Secondary | ICD-10-CM

## 2022-03-31 DIAGNOSIS — C7951 Secondary malignant neoplasm of bone: Secondary | ICD-10-CM | POA: Insufficient documentation

## 2022-03-31 DIAGNOSIS — Z171 Estrogen receptor negative status [ER-]: Secondary | ICD-10-CM | POA: Diagnosis not present

## 2022-03-31 DIAGNOSIS — Z853 Personal history of malignant neoplasm of breast: Secondary | ICD-10-CM | POA: Diagnosis not present

## 2022-03-31 DIAGNOSIS — I10 Essential (primary) hypertension: Secondary | ICD-10-CM

## 2022-03-31 DIAGNOSIS — R937 Abnormal findings on diagnostic imaging of other parts of musculoskeletal system: Secondary | ICD-10-CM | POA: Diagnosis not present

## 2022-03-31 DIAGNOSIS — G4733 Obstructive sleep apnea (adult) (pediatric): Secondary | ICD-10-CM

## 2022-03-31 DIAGNOSIS — I5032 Chronic diastolic (congestive) heart failure: Secondary | ICD-10-CM

## 2022-03-31 DIAGNOSIS — M810 Age-related osteoporosis without current pathological fracture: Secondary | ICD-10-CM

## 2022-03-31 LAB — CBC WITH DIFFERENTIAL (CANCER CENTER ONLY)
Abs Immature Granulocytes: 0.02 10*3/uL (ref 0.00–0.07)
Basophils Absolute: 0 10*3/uL (ref 0.0–0.1)
Basophils Relative: 0 %
Eosinophils Absolute: 0 10*3/uL (ref 0.0–0.5)
Eosinophils Relative: 0 %
HCT: 44.3 % (ref 36.0–46.0)
Hemoglobin: 14.7 g/dL (ref 12.0–15.0)
Immature Granulocytes: 0 %
Lymphocytes Relative: 16 %
Lymphs Abs: 1 10*3/uL (ref 0.7–4.0)
MCH: 32.4 pg (ref 26.0–34.0)
MCHC: 33.2 g/dL (ref 30.0–36.0)
MCV: 97.6 fL (ref 80.0–100.0)
Monocytes Absolute: 0.5 10*3/uL (ref 0.1–1.0)
Monocytes Relative: 9 %
Neutro Abs: 4.6 10*3/uL (ref 1.7–7.7)
Neutrophils Relative %: 75 %
Platelet Count: 218 10*3/uL (ref 150–400)
RBC: 4.54 MIL/uL (ref 3.87–5.11)
RDW: 12.9 % (ref 11.5–15.5)
WBC Count: 6.2 10*3/uL (ref 4.0–10.5)
nRBC: 0 % (ref 0.0–0.2)

## 2022-03-31 LAB — CMP (CANCER CENTER ONLY)
ALT: 18 U/L (ref 0–44)
AST: 21 U/L (ref 15–41)
Albumin: 4.2 g/dL (ref 3.5–5.0)
Alkaline Phosphatase: 63 U/L (ref 38–126)
Anion gap: 11 (ref 5–15)
BUN: 21 mg/dL (ref 8–23)
CO2: 29 mmol/L (ref 22–32)
Calcium: 9.9 mg/dL (ref 8.9–10.3)
Chloride: 103 mmol/L (ref 98–111)
Creatinine: 1.38 mg/dL — ABNORMAL HIGH (ref 0.44–1.00)
GFR, Estimated: 38 mL/min — ABNORMAL LOW (ref >60)
Glucose, Bld: 116 mg/dL — ABNORMAL HIGH (ref 70–99)
Potassium: 4.3 mmol/L (ref 3.5–5.1)
Sodium: 143 mmol/L (ref 135–145)
Total Bilirubin: 0.8 mg/dL (ref 0.3–1.2)
Total Protein: 6.8 g/dL (ref 6.5–8.1)

## 2022-03-31 MED ORDER — DENOSUMAB 120 MG/1.7ML ~~LOC~~ SOLN
120.0000 mg | Freq: Once | SUBCUTANEOUS | Status: AC
  Administered 2022-03-31: 120 mg via SUBCUTANEOUS
  Filled 2022-03-31: qty 1.7

## 2022-03-31 NOTE — Telephone Encounter (Signed)
-----  Message from Joni Reining, RN sent at 03/30/2022  2:54 PM EST ----- Regarding: Cpap orders Good afternoon- Per Dr. Radford Pax, this lady was with [facility] and was not set up with a new DME service when they fell apart. Her mask is leaking and she needs an appointment with anew DME company for a mask fitting.   She also needs a download in 2 months.  Thank you my dear! Danae Chen

## 2022-03-31 NOTE — Progress Notes (Unsigned)
Patient is doing well on current treatment. Recent PET showed decreased activity. No navigational needs at this time.   Oncology Nurse Navigator Documentation     03/31/2022   10:45 AM  Oncology Nurse Navigator Flowsheets  Navigator Follow Up Date: 06/01/2022  Navigator Follow Up Reason: Follow-up Appointment  Navigator Location [facility]  Navigator Encounter Type Appt/Treatment Plan Review  Patient Visit Type MedOnc  Treatment Phase Active Tx  Barriers/Navigation Needs Coordination of Care  Interventions None Required  Acuity Level 1-No Barriers  Support Groups/Services Friends and Family  Time Spent with Patient 15

## 2022-03-31 NOTE — Patient Instructions (Signed)
Denosumab Injection (Oncology) What is this medication? DENOSUMAB (den oh SUE mab) prevents weakened bones caused by cancer. It may also be used to treat noncancerous bone tumors that cannot be removed by surgery. It can also be used to treat high calcium levels in the blood caused by cancer. It works by blocking a protein that causes bones to break down quickly. This slows down the release of calcium from bones, which lowers calcium levels in your blood. It also makes your bones stronger and less likely to break (fracture). This medicine may be used for other purposes; ask your health care provider or pharmacist if you have questions. COMMON BRAND NAME(S): XGEVA What should I tell my care team before I take this medication? They need to know if you have any of these conditions: Dental disease Having surgery or tooth extraction Infection Kidney disease Low levels of calcium or vitamin D in the blood Malnutrition On hemodialysis Skin conditions or sensitivity Thyroid or parathyroid disease An unusual reaction to denosumab, other medications, foods, dyes, or preservatives Pregnant or trying to get pregnant Breast-feeding How should I use this medication? This medication is for injection under the skin. It is given by your care team in a hospital or clinic setting. A special MedGuide will be given to you before each treatment. Be sure to read this information carefully each time. Talk to your care team about the use of this medication in children. While it may be prescribed for children as young as 13 years for selected conditions, precautions do apply. Overdosage: If you think you have taken too much of this medicine contact a poison control center or emergency room at once. NOTE: This medicine is only for you. Do not share this medicine with others. What if I miss a dose? Keep appointments for follow-up doses. It is important not to miss your dose. Call your care team if you are unable to  keep an appointment. What may interact with this medication? Do not take this medication with any of the following: Other medications containing denosumab This medication may also interact with the following: Medications that lower your chance of fighting infection Steroid medications, such as prednisone or cortisone This list may not describe all possible interactions. Give your health care provider a list of all the medicines, herbs, non-prescription drugs, or dietary supplements you use. Also tell them if you smoke, drink alcohol, or use illegal drugs. Some items may interact with your medicine. What should I watch for while using this medication? Your condition will be monitored carefully while you are receiving this medication. You may need blood work while taking this medication. This medication may increase your risk of getting an infection. Call your care team for advice if you get a fever, chills, sore throat, or other symptoms of a cold or flu. Do not treat yourself. Try to avoid being around people who are sick. You should make sure you get enough calcium and vitamin D while you are taking this medication, unless your care team tells you not to. Discuss the foods you eat and the vitamins you take with your care team. Some people who take this medication have severe bone, joint, or muscle pain. This medication may also increase your risk for jaw problems or a broken thigh bone. Tell your care team right away if you have severe pain in your jaw, bones, joints, or muscles. Tell your care team if you have any pain that does not go away or that gets worse. Talk  to your care team if you may be pregnant. Serious birth defects can occur if you take this medication during pregnancy and for 5 months after the last dose. You will need a negative pregnancy test before starting this medication. Contraception is recommended while taking this medication and for 5 months after the last dose. Your care team  can help you find the option that works for you. What side effects may I notice from receiving this medication? Side effects that you should report to your care team as soon as possible: Allergic reactions--skin rash, itching, hives, swelling of the face, lips, tongue, or throat Bone, joint, or muscle pain Low calcium level--muscle pain or cramps, confusion, tingling, or numbness in the hands or feet Osteonecrosis of the jaw--pain, swelling, or redness in the mouth, numbness of the jaw, poor healing after dental work, unusual discharge from the mouth, visible bones in the mouth Side effects that usually do not require medical attention (report to your care team if they continue or are bothersome): Cough Diarrhea Fatigue Headache Nausea This list may not describe all possible side effects. Call your doctor for medical advice about side effects. You may report side effects to FDA at 1-800-FDA-1088. Where should I keep my medication? This medication is given in a hospital or clinic. It will not be stored at home. NOTE: This sheet is a summary. It may not cover all possible information. If you have questions about this medicine, talk to your doctor, pharmacist, or health care provider.  2023 Elsevier/Gold Standard (2021-07-07 00:00:00)  

## 2022-03-31 NOTE — Progress Notes (Signed)
Hematology and Oncology Follow Up Visit  Patricia [name] [id number] 1940/09/08 82 y.o. 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  We last saw her back in November.  She is doing pretty well.  I think she does have little bit of the dementia.  We did do a PET scan on her.  This was done on 03/26/2022.  The PET scan showed decreased activity in the L5 region.  She had radiation therapy in this area.  There is a small left-sided pleural effusion that was stable.  She had a well circumscribed lucency in the cortex of the left proximal femur.  Radiology is not sure what this is so we will do a plain film to see if this may show [name] anything.  Her last CA 27.29 is 13.7.  She has had no cough or shortness of breath.  She has had no headache.  She has had no nausea or vomiting.  There is no change in bowel or bladder habits.  She has had no leg swelling.  There has been no rashes.  Overall, I would say performance status is probably ECOG 1.  She is doing quite nicely.  She feels okay.  She still has little bit of pain with the left hip.  We did do a PET scan on her.  This was done on 01/21/2022.     Medications:  Current Outpatient Medications:    ADVAIR HFA 230-21 MCG/ACT inhaler, USE 2 INHALATIONS TWICE A DAY, Disp: 36 g, Rfl: 3   albuterol (PROVENTIL HFA;VENTOLIN HFA) 108 (90 Base) MCG/ACT inhaler, Inhale 2 puffs into the lungs every 6 (six) hours as needed for wheezing or shortness of breath., Disp: , Rfl:    apixaban (ELIQUIS) 5 MG TABS tablet, TAKE 1 TABLET TWICE A DAY, Disp: 180 tablet, Rfl: 3   Apoaequorin (PREVAGEN) 10 MG CAPS, Take 1 capsule by mouth every  morning., Disp: 90 capsule, Rfl: 3   ARMOUR THYROID 15 MG tablet, TAKE 1 TABLET DAILY ALONG WITH 60 MG, Disp: 90 tablet, Rfl: 3   ARMOUR THYROID 60 MG tablet, TAKE 1 TABLET DAILY BEFORE BREAKFAST, Disp: 90 tablet, Rfl: 3   atorvastatin (LIPITOR) 40 MG tablet, Take 1 tablet (40 mg total) by mouth daily before supper., Disp: 90 tablet, Rfl: 3   bimatoprost (LUMIGAN) 0.01 % SOLN, Place 1 drop into both eyes 2 (two) times daily., Disp: , Rfl:    calcium-vitamin D (OSCAL WITH D) 500-200 MG-UNIT tablet, Take 1 tablet by mouth daily with breakfast., Disp: 30 tablet, Rfl: 0   diltiazem (CARDIZEM CD) 300 MG 24 hr capsule, Take 1 capsule (300 mg total) by mouth daily., Disp: 90 capsule, Rfl: 3   dorzolamide-timolol (COSOPT) 22.3-6.8 MG/ML ophthalmic solution, Place 1 drop into both eyes at bedtime., Disp: , Rfl:    fluticasone (FLONASE) 50 MCG/ACT nasal spray, 1 spray each nostril following sinus rinses twice daily, Disp: 16 g, Rfl: 2   furosemide (LASIX) 40 MG tablet, Take 1 tablet (40 mg total) by mouth 2 (two) times daily., Disp: 180 tablet, Rfl: 3   gabapentin (NEURONTIN) 400 MG capsule, TAKE 1 CAPSULE TWICE A DAY, Disp: 180 capsule, Rfl: 3  L-Methylfolate-B12-B6-B2 (CEREFOLIN) 07-31-48-5 MG TABS, Take 1 tablet by mouth every morning., Disp: 90 tablet, Rfl: 3   levETIRAcetam (KEPPRA XR) 500 MG 24 hr tablet, Take 1 tablet (500 mg total) by mouth at bedtime., Disp: 90 tablet, Rfl: 3   loratadine (CLARITIN) 10 MG tablet, Take 10 mg by mouth daily., Disp: , Rfl:    memantine (NAMENDA) 10 MG tablet, Take 1 tablet (10 mg total) by mouth 2 (two) times daily., Disp: 180 tablet, Rfl: 3   metoprolol succinate (TOPROL-XL) 50 MG 24 hr tablet, Take 1.5 tablets (75 mg total) by mouth daily. Take with or immediately following a meal., Disp: 135 tablet, Rfl: 2   Multiple Vitamins-Iron (MULTIVITAMIN/IRON) TABS, Take 1 tablet by mouth daily., Disp: , Rfl:    Netarsudil Dimesylate (RHOPRESSA) 0.02 % SOLN, Place 1 drop into  both eyes at bedtime., Disp: , Rfl:    pantoprazole (PROTONIX) 40 MG tablet, TAKE 1 TABLET DAILY, Disp: 90 tablet, Rfl: 3   potassium chloride SA (KLOR-CON M) 20 MEQ tablet, TAKE 1 TABLET TWICE A DAY, Disp: 180 tablet, Rfl: 3   traMADol (ULTRAM) 50 MG tablet, Take 1 tablet (50 mg total) by mouth every 6 (six) hours as needed., Disp: 60 tablet, Rfl: 0   Vitamin D, Ergocalciferol, (DRISDOL) 1.25 MG (50000 UNIT) CAPS capsule, TAKE 1 CAPSULE WEEKLY, Disp: 12 capsule, Rfl: 3  Allergies:  Allergies  Allergen Reactions   Combigan [Brimonidine Tartrate-Timolol] Itching    Eyes itch, reddened   Other Other (See Comments)    Per patient made OU red, Sore, and sensitivity to light   Sulfa Antibiotics Rash   Sulfamethoxazole-Trimethoprim Hives    Past Medical History, Surgical history, Social history, and Family History were reviewed and updated.  Review of Systems: Review of Systems  Constitutional: Negative.   HENT:  Negative.    Eyes: Negative.   Respiratory: Negative.    Cardiovascular: Negative.   Gastrointestinal: Negative.   Endocrine: Negative.   Genitourinary: Negative.    Musculoskeletal: Negative.   Skin: Negative.   Neurological: Negative.   Hematological: Negative.   Psychiatric/Behavioral: Negative.      Physical Exam:  height is 5' (1.524 m) and weight is 146 lb (66.2 kg). Her oral temperature is 98.6 F (37 C). Her blood pressure is 101/84 and her pulse is 75. Her respiration is 18 and oxygen saturation is 95%.   Wt Readings from Last 3 Encounters:  03/31/22 146 lb (66.2 kg)  03/30/22 144 lb (65.3 kg)  02/10/22 153 lb 3.2 oz (69.5 kg)    Physical Exam Vitals reviewed.  HENT:     Head: Normocephalic and atraumatic.  Eyes:     Pupils: Pupils are equal, round, and reactive to light.  Cardiovascular:     Rate and Rhythm: Normal rate and regular rhythm.     Heart sounds: Normal heart sounds.  Pulmonary:     Effort: Pulmonary effort is normal.     Breath sounds:  Normal breath sounds.  Abdominal:     General: Bowel sounds are normal.     Palpations: Abdomen is soft.  Musculoskeletal:        General: No tenderness or deformity. Normal range of motion.     Cervical back: Normal range of motion.  Lymphadenopathy:     Cervical: No cervical adenopathy.  Skin:    General: Skin is warm and dry.     Findings: No erythema or rash.  Neurological:     Mental Status: She is alert  and oriented to person, place, and time.  Psychiatric:        Behavior: Behavior normal.        Thought Content: Thought content normal.        Judgment: Judgment normal.      Lab Results  Component Value Date   WBC 6.2 03/31/2022   HGB 14.7 03/31/2022   HCT 44.3 03/31/2022   MCV 97.6 03/31/2022   PLT 218 03/31/2022     Chemistry      Component Value Date/Time   NA 143 03/31/2022 0950   NA 143 10/09/2021 1056   NA 141 09/22/2016 1459   K 4.3 03/31/2022 0950   K 3.8 09/22/2016 1459   CL 103 03/31/2022 0950   CO2 29 03/31/2022 0950   CO2 26 09/22/2016 1459   BUN 21 03/31/2022 0950   BUN 17 10/09/2021 1056   BUN 19.8 09/22/2016 1459   CREATININE 1.38 (H) 03/31/2022 0950   CREATININE 1.1 09/22/2016 1459      Component Value Date/Time   CALCIUM 9.9 03/31/2022 0950   CALCIUM 9.2 09/22/2016 1459   ALKPHOS 63 03/31/2022 0950   ALKPHOS 98 09/22/2016 1459   AST 21 03/31/2022 0950   AST 15 09/22/2016 1459   ALT 18 03/31/2022 0950   ALT 21 09/22/2016 1459   BILITOT 0.8 03/31/2022 0950   BILITOT 0.65 09/22/2016 1459      Impression and Plan: Patricia [name] is a very nice 82 year old postmenopausal white female.  She has a solitary metastatic focus from breast cancer.  Her initial breast cancer was probably 7 years ago.  It was estrogen positive.  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  The HER2 was equivocal on initial staining but on FISH was negative.  I think everything is doing quite well for her.  We will give her the Xgeva  today.  For right now, I do not think we need another PET scan probably for another 3 months or so.  I like to have her come back to see me in 2 months.  She gets her Delton See every couple months.  Her cognitive issues certainly do not appear to be any worse.  We will go ahead and plan to get her back in 2 months.    Volanda Napoleon, MD 1/30/202411:09 AM

## 2022-03-31 NOTE — Telephone Encounter (Signed)
Appointment with dme order placed.

## 2022-03-31 NOTE — Telephone Encounter (Addendum)
DME selection is [facility]. Patient understands he will be contacted by [facility] to set up his cpap. Patient understands to call if [facility] does not contact him with new setup in a timely manner. Patient understands they will be called once confirmation has been received from [facility]/ that they have received their new machine to schedule 10 week follow up appointment.   [facility] notified of new cpap order  Please add to airview Patient was grateful for the call and thanked me.

## 2022-04-01 ENCOUNTER — Encounter: Payer: Self-pay | Admitting: Hematology & Oncology

## 2022-04-01 LAB — CANCER ANTIGEN 27.29: CA 27.29: 19 U/mL (ref 0.0–38.6)

## 2022-04-09 ENCOUNTER — Encounter (HOSPITAL_COMMUNITY): Payer: Self-pay | Admitting: *Deleted

## 2022-04-23 ENCOUNTER — Ambulatory Visit (INDEPENDENT_AMBULATORY_CARE_PROVIDER_SITE_OTHER): Payer: Medicare Other | Admitting: Family Medicine

## 2022-04-23 ENCOUNTER — Encounter: Payer: Self-pay | Admitting: Family Medicine

## 2022-04-23 VITALS — BP 112/79 | HR 97 | Resp 22 | Ht 60.0 in | Wt 148.0 lb

## 2022-04-23 DIAGNOSIS — R053 Chronic cough: Secondary | ICD-10-CM

## 2022-04-23 DIAGNOSIS — I272 Pulmonary hypertension, unspecified: Secondary | ICD-10-CM

## 2022-04-23 DIAGNOSIS — I10 Essential (primary) hypertension: Secondary | ICD-10-CM

## 2022-04-23 DIAGNOSIS — J45991 Cough variant asthma: Secondary | ICD-10-CM

## 2022-04-23 DIAGNOSIS — Z136 Encounter for screening for cardiovascular disorders: Secondary | ICD-10-CM | POA: Diagnosis not present

## 2022-04-23 DIAGNOSIS — Z131 Encounter for screening for diabetes mellitus: Secondary | ICD-10-CM

## 2022-04-23 DIAGNOSIS — E032 Hypothyroidism due to medicaments and other exogenous substances: Secondary | ICD-10-CM

## 2022-04-23 NOTE — Assessment & Plan Note (Signed)
Continue current regimen, will check TSH at next appointment.

## 2022-04-23 NOTE — Assessment & Plan Note (Signed)
Stable.  Continue diltiazem, Lasix.  Lab work at next visit.  Will continue to monitor.

## 2022-04-23 NOTE — Assessment & Plan Note (Signed)
Stable.  We did discuss that the last time that she was seen at pulmonology was in 2021.  She agrees that a referral to pulmonology would be beneficial since she does have a chronic cough now as well as a history of asthma and lung cancer.

## 2022-04-23 NOTE — Progress Notes (Signed)
   Established Patient Office Visit  Subjective   Patient ID: Patricia [name], female    DOB: Mar 28, 1940  Age: 82 y.o. MRN: [id number]  Chief Complaint  Patient presents with   Cough    HPI Patricia [name] is a 82 y.o. female presenting today for follow up of chronic cough. Patient has a history of asthma as well as lung cancer.  She has a rescue inhaler which she uses on very rare occasions.  She has had a chronic cough for many years which is usually productive.  She finds that the scratchy throat that accompanies it is usually alleviated by warm water.  She was previously seen by pulmonology, but her last appointment was in 2021 and she has not been seen since then.  She says she mostly came in today just to check-in as she has not been seen for about 6 months.  ROS Negative unless otherwise noted in HPI   Objective:     BP 112/79 (BP Location: Left Arm, Patient Position: Sitting, Cuff Size: Normal)   Pulse 97   Resp (!) 22   Ht 5' (1.524 m)   Wt 148 lb (67.1 kg)   SpO2 92%   BMI 28.90 kg/m   Physical Exam Constitutional:      General: She is not in acute distress.    Appearance: Normal appearance.  HENT:     Head: Normocephalic and atraumatic.  Neck:     Vascular: No carotid bruit.  Cardiovascular:     Rate and Rhythm: Normal rate and regular rhythm.     Pulses: Normal pulses.     Heart sounds: No murmur heard.    No friction rub. No gallop.  Pulmonary:     Effort: Pulmonary effort is normal. No respiratory distress.     Breath sounds: No wheezing, rhonchi or rales.  Skin:    General: Skin is warm and dry.  Neurological:     Mental Status: She is alert and oriented to person, place, and time.     Assessment & Plan:  Essential hypertension Assessment & Plan: Stable.  Continue diltiazem, Lasix.  Lab work at next visit.  Will continue to monitor.   Pulmonary HTN ([place]) -     Ambulatory referral to Pulmonology  Hypothyroidism due to medication Assessment &  Plan: Continue current regimen, will check TSH at next appointment.   Chronic cough Assessment & Plan: Stable.  We did discuss that the last time that she was seen at pulmonology was in 2021.  She agrees that a referral to pulmonology would be beneficial since she does have a chronic cough now as well as a history of asthma and lung cancer.    Orders: -     Ambulatory referral to Pulmonology  Cough variant asthma -     Ambulatory referral to Pulmonology   Return in about 6 months (around 10/22/2022) for Medicare annual wellness visit, fasting blood work 1 week before.    Velva Harman, PA

## 2022-04-29 ENCOUNTER — Encounter: Payer: Self-pay | Admitting: Cardiology

## 2022-05-05 ENCOUNTER — Ambulatory Visit (INDEPENDENT_AMBULATORY_CARE_PROVIDER_SITE_OTHER): Payer: Medicare Other | Admitting: Internal Medicine

## 2022-05-05 ENCOUNTER — Encounter: Payer: Self-pay | Admitting: Internal Medicine

## 2022-05-05 VITALS — BP 122/76 | HR 98 | Temp 97.7°F | Ht 60.0 in | Wt 148.2 lb

## 2022-05-05 DIAGNOSIS — J4540 Moderate persistent asthma, uncomplicated: Secondary | ICD-10-CM

## 2022-05-05 NOTE — Patient Instructions (Signed)
Please schedule follow up scheduled with myself in 1 year.  If my schedule is not open yet, we will contact you with a reminder closer to that time. Please call [phone] if you haven't heard from [name] a month before.   Call me sooner if any issues with your breathing.  Continue the advair for your asthma with albuterol as needed.  Call me if albuterol use is consistently increasing to use more than twice/week or is no longer effective Continue gabapentin for cough if its helping.

## 2022-05-05 NOTE — Progress Notes (Signed)
Yannis Grossman    [id number]    03/18/1940  Primary Care Physician:Edstrom, Darel Hong, PA Date of Appointment: 05/05/2022 Established Patient Visit  Chief complaint:   Chief Complaint  Patient presents with   Follow-up    Cough at night      HPI: Patricia [name] is a 82 y.o. woman with history of atrial fibrillation, breast cancer, carcinoid tumor s/p resection of lingula. Also has metastatic breast cancer and has had radiation therapy on her vertebral bodies, hip  Interval Updates: Last seen by me for chronic cough in November 2021 almost three years ago.  Here for follow up for chronic cough. Has been on advair as well for asthma.  Had asthma flare leading to worsening cough (not necessarily dyspnea or wheezing.) she did use albuterol inhaler. Also still taking gabapentin for cough.  No courses of prednisone for asthma.   Current Regimen: advair 230 2 puffs twice a day. Albuterol as needed Asthma Triggers: seasonal variation,  Exacerbations in the last year: none requiring  History of hospitalization or intubation: none Allergy Testing: yes, sensitive to molds, pollen (many years ago.) GERD: denies Allergic Rhinitis: yes on claritin ACT:      No data to display         FeNO:   I have reviewed the patient's family social and past medical history and updated as appropriate.   Past Medical History:  Diagnosis Date   Allergic rhinitis, cause unspecified    Aortic regurgitation 06/17/2015   mild by echo 2021   Arthritis    in my fingers   Cancer (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 Dx 04/2015 at time of stroke. b. DCCV 06/2015 - did not hold. Amio started then stopped due to QT prolongation.   Chronic cough    Chronic diastolic heart failure ([place]) 06/17/2015   CKD (chronic kidney disease), stage III (HCC)    Cough variant asthma    Disease of pharynx or nasopharynx    Diverticulosis    Enlargement of  right atrium 01/23/2020   Essential hypertension    GERD (gastroesophageal reflux disease)    Glaucoma    Hiatal hernia    Hyperlipidemia    Internal hemorrhoids    Laryngospasm    Mitral regurgitation    mild to moderate by echo 12/2019 and moderate by TEE 12/2020   Multinodular goiter    Osteopenia    Postmenopausal    Pulmonary HTN (HCC)     Moderate with PASP 1mHg by echo 2018 likely Group 2 from pulmonary venous HTN from CHF>>normal PAP by echo 12/2019   Radiation 10/22/14-11/23/14   Left Breast   Stricture and stenosis of cervix     Past Surgical History:  Procedure Laterality Date   BREAST LUMPECTOMY Left    BREAST SURGERY     CARDIOVERSION N/A 06/24/2015   Procedure: CARDIOVERSION;  Surgeon: TSueanne Margarita MD;  Location: [facility]  Service: Cardiovascular;  Laterality: N/A;   CARDIOVERSION N/A 05/19/2016   Procedure: CARDIOVERSION;  Surgeon: TSueanne Margarita MD;  Location: [facility];  Service: Cardiovascular;  Laterality: N/A;   COLONOSCOPY     LUNG CANCER SURGERY  2003   resection carcinoid lingula-lt upper lobe   RADIOACTIVE SEED GUIDED PARTIAL MASTECTOMY WITH AXILLARY SENTINEL LYMPH NODE BIOPSY Left 06/26/2014   Procedure: RADIOACTIVE SEED GUIDED PARTIAL MASTECTOMY WITH AXILLARY SENTINEL LYMPH NODE  BIOPSY;  Surgeon: Stark Klein, MD;  Location: [facility];  Service: General;  Laterality: Left;   RE-EXCISION OF BREAST LUMPECTOMY Left 08/07/2014   Procedure: RE-EXCISION OF LEFT BREAST LUMPECTOMY;  Surgeon: Stark Klein, MD;  Location: [facility];  Service: General;  Laterality: Left;   RIGHT HEART CATH N/A 04/27/2016   Procedure: Right Heart Cath;  Surgeon: Larey Dresser, MD;  Location: [facility];  Service: Cardiovascular;  Laterality: N/A;   TEE WITHOUT CARDIOVERSION N/A 01/07/2021   Procedure: TRANSESOPHAGEAL ECHOCARDIOGRAM (TEE);  Surgeon: Thayer Headings, MD;  Location[facility];  Service: Cardiovascular;  Laterality:  N/A;    Family History  Problem Relation Age of Onset   Stroke Mother    Hypertension Mother    Hyperlipidemia Mother    Stroke Father    Transient ischemic attack Father    Hyperlipidemia Father    Hypertension Father    Atrial fibrillation Son    Heart attack Neg Hx     Social History   Occupational History   Occupation: Retired  Tobacco Use   Smoking status: Former    Packs/day: 0.75    Years: 5.00    Total pack years: 3.75    Types: Cigarettes    Quit date: 06/15/1966    Years since quitting: 55.9    Passive exposure: Past   Smokeless tobacco: Never  Vaping Use   Vaping Use: Never used  Substance and Sexual Activity   Alcohol use: Not Currently    Comment: occasional wine   Drug use: No   Sexual activity: Not Currently    Birth control/protection: Post-menopausal     Physical Exam: Blood pressure 122/76, pulse 98, temperature 97.7 F (36.5 C), temperature source Oral, height 5' (1.524 m), weight 148 lb 3.2 oz (67.2 kg), SpO2 96 %.  Gen:      No acute distress, mildly hoarse voice ENT:  no nasal polyps, mucus membranes moist Lungs:    No increased respiratory effort, symmetric chest wall excursion, clear to auscultation bilaterally, no wheezes or crackles CV:         Regular rate and rhythm; no murmurs, rubs, or gallops.  No pedal edema   Data Reviewed: Imaging: I have personally reviewed the pet scan jan 2024 which shows no nodules or masses in the chest. Small left sided pleural effusion.   PFTs:     Latest Ref Rng & Units 12/21/2019   10:49 AM 09/17/2015    3:38 PM  PFT Results  FVC-Pre L 1.58  1.52   FVC-Predicted Pre % 76  71   FVC-Post L 1.62  1.45   FVC-Predicted Post % 78  68   Pre FEV1/FVC % % 83  85   Post FEV1/FCV % % 85  86   FEV1-Pre L 1.32  1.28   FEV1-Predicted Pre % 86  81   FEV1-Post L 1.38  1.25   DLCO uncorrected ml/min/mmHg 9.51  11.72   DLCO UNC% % 60  72   DLCO corrected ml/min/mmHg  11.32   DLCO COR %Predicted %  69    DLVA Predicted % 95  97   TLC L 2.63  3.48   TLC % Predicted % 61  83   RV % Predicted % 40  84    I have personally reviewed the patient's PFTs and no airflow limitation.   Labs:  Immunization status: Immunization History  Administered Date(s) Administered   COVID-19, mRNA, vaccine(Comirnaty)12 years and  older 12/12/2021   Hepatitis A, Adult 12/29/2012   Influenza Split 12/11/2010, 12/01/2011   Influenza Whole 01/21/2007, 01/01/2009, 11/28/2009   Influenza, High Dose Seasonal PF 12/01/2013, 12/14/2014, 12/02/2015, 12/18/2016, 12/14/2017, 11/12/2020, 12/12/2021   Influenza,inj,Quad PF,6+ Mos 12/29/2012, 12/01/2013   Influenza-Unspecified 12/18/2016, 12/14/2019   PFIZER(Purple Top)SARS-COV-2 Vaccination 03/14/2019, 04/03/2019, 11/27/2019   Pfizer Covid-19 Vaccine Bivalent Booster 70yr & up 11/12/2020   Pneumococcal Conjugate-13 02/08/2014   Pneumococcal Polysaccharide-23 03/02/2005, 01/26/2011   Rsv, Bivalent, Protein Subunit Rsvpref,pf (Evans Lance 12/15/2021   Td 03/02/2000, 12/29/2012   Typhoid Inactivated 10/01/2015   Yellow Fever 10/01/2015   Zoster Recombinat (Shingrix) 12/16/2021, 03/19/2022    External Records Personally Reviewed: oncology, pcp, pulmonary  Assessment:  MCosby Pisciottais a 82y.o. woman with metastatic breast cancer, carcinoid tumor s/p lingular resection who presents with:   Moderate persistent asthma, well controlled Chronic cough  Plan/Recommendations:  Continue the advair for your asthma with albuterol as needed.  Call me if albuterol use is consistently increasing to use more than twice/week or is no longer effective Continue gabapentin for cough if its helping.    Return to Care: Return in about 1 year (around 05/05/2023).   NLenice Llamas MD Pulmonary and [place]

## 2022-05-25 ENCOUNTER — Other Ambulatory Visit: Payer: Self-pay | Admitting: Cardiology

## 2022-06-01 ENCOUNTER — Other Ambulatory Visit: Payer: Self-pay

## 2022-06-01 ENCOUNTER — Inpatient Hospital Stay (HOSPITAL_BASED_OUTPATIENT_CLINIC_OR_DEPARTMENT_OTHER): Payer: Medicare Other | Admitting: Hematology & Oncology

## 2022-06-01 ENCOUNTER — Inpatient Hospital Stay: Payer: Medicare Other | Attending: Hematology & Oncology

## 2022-06-01 ENCOUNTER — Inpatient Hospital Stay: Payer: Medicare Other

## 2022-06-01 ENCOUNTER — Encounter: Payer: Self-pay | Admitting: Hematology & Oncology

## 2022-06-01 VITALS — BP 108/72 | Temp 97.7°F | Resp 18 | Ht 60.0 in | Wt 146.0 lb

## 2022-06-01 DIAGNOSIS — C50412 Malignant neoplasm of upper-outer quadrant of left female breast: Secondary | ICD-10-CM | POA: Diagnosis not present

## 2022-06-01 DIAGNOSIS — Z170 Estrogen receptor positive status [ER+]: Secondary | ICD-10-CM

## 2022-06-01 DIAGNOSIS — C7951 Secondary malignant neoplasm of bone: Secondary | ICD-10-CM | POA: Insufficient documentation

## 2022-06-01 DIAGNOSIS — Z853 Personal history of malignant neoplasm of breast: Secondary | ICD-10-CM | POA: Insufficient documentation

## 2022-06-01 DIAGNOSIS — M810 Age-related osteoporosis without current pathological fracture: Secondary | ICD-10-CM

## 2022-06-01 DIAGNOSIS — M8580 Other specified disorders of bone density and structure, unspecified site: Secondary | ICD-10-CM

## 2022-06-01 LAB — CMP (CANCER CENTER ONLY)
ALT: 15 U/L (ref 0–44)
AST: 21 U/L (ref 15–41)
Albumin: 4.3 g/dL (ref 3.5–5.0)
Alkaline Phosphatase: 73 U/L (ref 38–126)
Anion gap: 9 (ref 5–15)
BUN: 23 mg/dL (ref 8–23)
CO2: 31 mmol/L (ref 22–32)
Calcium: 10.7 mg/dL — ABNORMAL HIGH (ref 8.9–10.3)
Chloride: 100 mmol/L (ref 98–111)
Creatinine: 1.24 mg/dL — ABNORMAL HIGH (ref 0.44–1.00)
GFR, Estimated: 44 mL/min — ABNORMAL LOW
Glucose, Bld: 121 mg/dL — ABNORMAL HIGH (ref 70–99)
Potassium: 4.6 mmol/L (ref 3.5–5.1)
Sodium: 140 mmol/L (ref 135–145)
Total Bilirubin: 0.6 mg/dL (ref 0.3–1.2)
Total Protein: 6.7 g/dL (ref 6.5–8.1)

## 2022-06-01 LAB — CBC WITH DIFFERENTIAL (CANCER CENTER ONLY)
Abs Immature Granulocytes: 0.03 10*3/uL (ref 0.00–0.07)
Basophils Absolute: 0 10*3/uL (ref 0.0–0.1)
Basophils Relative: 0 %
Eosinophils Absolute: 0 10*3/uL (ref 0.0–0.5)
Eosinophils Relative: 0 %
HCT: 43.9 % (ref 36.0–46.0)
Hemoglobin: 14.9 g/dL (ref 12.0–15.0)
Immature Granulocytes: 1 %
Lymphocytes Relative: 21 %
Lymphs Abs: 1.2 10*3/uL (ref 0.7–4.0)
MCH: 32.7 pg (ref 26.0–34.0)
MCHC: 33.9 g/dL (ref 30.0–36.0)
MCV: 96.3 fL (ref 80.0–100.0)
Monocytes Absolute: 0.7 10*3/uL (ref 0.1–1.0)
Monocytes Relative: 12 %
Neutro Abs: 3.8 10*3/uL (ref 1.7–7.7)
Neutrophils Relative %: 66 %
Platelet Count: 226 10*3/uL (ref 150–400)
RBC: 4.56 MIL/uL (ref 3.87–5.11)
RDW: 13.3 % (ref 11.5–15.5)
WBC Count: 5.7 10*3/uL (ref 4.0–10.5)
nRBC: 0 % (ref 0.0–0.2)

## 2022-06-01 MED ORDER — TRAMADOL HCL 50 MG PO TABS: 50.0000 mg | ORAL_TABLET | Freq: Four times a day (QID) | ORAL | 0 refills | Status: DC | PRN

## 2022-06-01 MED ORDER — DENOSUMAB 120 MG/1.7ML ~~LOC~~ SOLN
120.0000 mg | Freq: Once | SUBCUTANEOUS | Status: AC
  Administered 2022-06-01: 120 mg via SUBCUTANEOUS
  Filled 2022-06-01: qty 1.7

## 2022-06-01 NOTE — Progress Notes (Signed)
Hematology and Oncology Follow Up Visit  Patricia [name] [id number] March 08, 1940 82 y.o. 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 is back for follow-up.  We last saw her back in January.  She has been doing pretty well.  She and her husband have been doing mostly things at home.  She really cannot play golf because of the hip issue.  Will go ahead and give her a handicap sticker application.  I think this will help her out.  Her last CA 27-29 was 19.7.  We will see what this level is today.  Marland Kitchen  She will be due for a PET scan.  We will get this set up for her in early May.  She has had no problems with nausea or vomiting.  There is been little cough.  Is nonproductive.  Overall, she has had no change in bowel or bladder habits.  She does get little stiff in the morning.  This is over on the left hip.  Again, tramadol seems to help this.  Overall, I would say that her performance status is probably ECOG 2.       Medications:  Current Outpatient Medications:    ADVAIR HFA 230-21 MCG/ACT inhaler, USE 2 INHALATIONS TWICE A DAY, Disp: 36 g, Rfl: 3   albuterol (PROVENTIL HFA;VENTOLIN HFA) 108 (90 Base) MCG/ACT inhaler, Inhale 2 puffs into the lungs every 6 (six) hours as needed for wheezing or shortness of breath., Disp: , Rfl:    apixaban (ELIQUIS) 5 MG TABS tablet, TAKE 1 TABLET TWICE A DAY, Disp: 180 tablet, Rfl: 3   Apoaequorin (PREVAGEN) 10 MG CAPS, Take 1 capsule by mouth every morning., Disp: 90 capsule, Rfl: 3   ARMOUR THYROID 15 MG tablet, TAKE 1 TABLET DAILY ALONG WITH 60 MG, Disp: 90 tablet, Rfl: 3   ARMOUR THYROID 60 MG tablet, TAKE 1 TABLET DAILY BEFORE BREAKFAST, Disp:  90 tablet, Rfl: 3   atorvastatin (LIPITOR) 40 MG tablet, Take 1 tablet (40 mg total) by mouth daily before supper., Disp: 90 tablet, Rfl: 3   bimatoprost (LUMIGAN) 0.01 % SOLN, Place 1 drop into both eyes 2 (two) times daily., Disp: , Rfl:    calcium-vitamin D (OSCAL WITH D) 500-200 MG-UNIT tablet, Take 1 tablet by mouth daily with breakfast., Disp: 30 tablet, Rfl: 0   diltiazem (CARDIZEM CD) 300 MG 24 hr capsule, Take 1 capsule (300 mg total) by mouth daily., Disp: 90 capsule, Rfl: 3   dorzolamide-timolol (COSOPT) 22.3-6.8 MG/ML ophthalmic solution, Place 1 drop into both eyes at bedtime., Disp: , Rfl:    fluticasone (FLONASE) 50 MCG/ACT nasal spray, 1 spray each nostril following sinus rinses twice daily, Disp: 16 g, Rfl: 2   furosemide (LASIX) 40 MG tablet, TAKE 1 TABLET TWICE A DAY, Disp: 180 tablet, Rfl: 3   gabapentin (NEURONTIN) 400 MG capsule, TAKE 1 CAPSULE TWICE A DAY, Disp: 180 capsule, Rfl: 3   L-Methylfolate-B12-B6-B2 (CEREFOLIN) 07-31-48-5 MG TABS, Take 1 tablet by mouth every morning., Disp: 90 tablet, Rfl: 3   levETIRAcetam (KEPPRA XR) 500 MG 24 hr tablet, Take 1 tablet (500 mg total) by mouth at bedtime., Disp: 90 tablet, Rfl: 3   loratadine (CLARITIN) 10 MG  tablet, Take 10 mg by mouth daily., Disp: , Rfl:    memantine (NAMENDA) 10 MG tablet, Take 1 tablet (10 mg total) by mouth 2 (two) times daily., Disp: 180 tablet, Rfl: 3   metoprolol succinate (TOPROL-XL) 50 MG 24 hr tablet, Take 1.5 tablets (75 mg total) by mouth daily. Take with or immediately following a meal., Disp: 135 tablet, Rfl: 2   Multiple Vitamins-Iron (MULTIVITAMIN/IRON) TABS, Take 1 tablet by mouth daily., Disp: , Rfl:    Netarsudil Dimesylate (RHOPRESSA) 0.02 % SOLN, Place 1 drop into both eyes at bedtime., Disp: , Rfl:    pantoprazole (PROTONIX) 40 MG tablet, TAKE 1 TABLET DAILY, Disp: 90 tablet, Rfl: 3   potassium chloride SA (KLOR-CON M) 20 MEQ tablet, TAKE 1 TABLET TWICE A DAY, Disp: 180 tablet, Rfl: 3    traMADol (ULTRAM) 50 MG tablet, Take 1 tablet (50 mg total) by mouth every 6 (six) hours as needed., Disp: 60 tablet, Rfl: 0   Vitamin D, Ergocalciferol, (DRISDOL) 1.25 MG (50000 UNIT) CAPS capsule, TAKE 1 CAPSULE WEEKLY, Disp: 12 capsule, Rfl: 3  Allergies:  Allergies  Allergen Reactions   Combigan [Brimonidine Tartrate-Timolol] Itching    Eyes itch, reddened   Other Other (See Comments)    Per patient made OU red, Sore, and sensitivity to light   Sulfa Antibiotics Rash   Sulfamethoxazole-Trimethoprim Hives    Past Medical History, Surgical history, Social history, and Family History were reviewed and updated.  Review of Systems: Review of Systems  Constitutional: Negative.   HENT:  Negative.    Eyes: Negative.   Respiratory: Negative.    Cardiovascular: Negative.   Gastrointestinal: Negative.   Endocrine: Negative.   Genitourinary: Negative.    Musculoskeletal: Negative.   Skin: Negative.   Neurological: Negative.   Hematological: Negative.   Psychiatric/Behavioral: Negative.      Physical Exam:  height is 5' (1.524 m) and weight is 146 lb (66.2 kg). Her oral temperature is 97.7 F (36.5 C). Her blood pressure is 108/72. Her respiration is 18 and oxygen saturation is 92%.   Wt Readings from Last 3 Encounters:  06/01/22 146 lb (66.2 kg)  05/05/22 148 lb 3.2 oz (67.2 kg)  04/23/22 148 lb (67.1 kg)    Physical Exam Vitals reviewed.  HENT:     Head: Normocephalic and atraumatic.  Eyes:     Pupils: Pupils are equal, round, and reactive to light.  Cardiovascular:     Rate and Rhythm: Normal rate and regular rhythm.     Heart sounds: Normal heart sounds.  Pulmonary:     Effort: Pulmonary effort is normal.     Breath sounds: Normal breath sounds.  Abdominal:     General: Bowel sounds are normal.     Palpations: Abdomen is soft.  Musculoskeletal:        General: No tenderness or deformity. Normal range of motion.     Cervical back: Normal range of motion.   Lymphadenopathy:     Cervical: No cervical adenopathy.  Skin:    General: Skin is warm and dry.     Findings: No erythema or rash.  Neurological:     Mental Status: She is alert and oriented to person, place, and time.  Psychiatric:        Behavior: Behavior normal.        Thought Content: Thought content normal.        Judgment: Judgment normal.      Lab Results  Component Value Date  WBC 5.7 06/01/2022   HGB 14.9 06/01/2022   HCT 43.9 06/01/2022   MCV 96.3 06/01/2022   PLT 226 06/01/2022     Chemistry      Component Value Date/Time   NA 143 03/31/2022 0950   NA 143 10/09/2021 1056   NA 141 09/22/2016 1459   K 4.3 03/31/2022 0950   K 3.8 09/22/2016 1459   CL 103 03/31/2022 0950   CO2 29 03/31/2022 0950   CO2 26 09/22/2016 1459   BUN 21 03/31/2022 0950   BUN 17 10/09/2021 1056   BUN 19.8 09/22/2016 1459   CREATININE 1.38 (H) 03/31/2022 0950   CREATININE 1.1 09/22/2016 1459      Component Value Date/Time   CALCIUM 9.9 03/31/2022 0950   CALCIUM 9.2 09/22/2016 1459   ALKPHOS 63 03/31/2022 0950   ALKPHOS 98 09/22/2016 1459   AST 21 03/31/2022 0950   AST 15 09/22/2016 1459   ALT 18 03/31/2022 0950   ALT 21 09/22/2016 1459   BILITOT 0.8 03/31/2022 0950   BILITOT 0.65 09/22/2016 1459      Impression and Plan: Ms. Sarubbi is a very nice 82 year old postmenopausal white female.  She has a solitary metastatic focus from breast cancer.  Her initial breast cancer was probably 7 years ago.  It was estrogen positive.  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  The HER2 was equivocal on initial staining but on FISH was negative.  She will get her Delton See today.  Again, we will set the PET scan up for her in about 3-4 weeks.  If all looks good on the PET scan, then hopefully we can try to get her through all of Summer without having to see her.  Again, she will get the Xgeva.  I called in the tramadol.   Volanda Napoleon,  MD 4/1/20243:12 PM

## 2022-06-01 NOTE — Patient Instructions (Signed)
Denosumab Injection (Oncology) What is this medication? DENOSUMAB (den oh SUE mab) prevents weakened bones caused by cancer. It may also be used to treat noncancerous bone tumors that cannot be removed by surgery. It can also be used to treat high calcium levels in the blood caused by cancer. It works by blocking a protein that causes bones to break down quickly. This slows down the release of calcium from bones, which lowers calcium levels in your blood. It also makes your bones stronger and less likely to break (fracture). This medicine may be used for other purposes; ask your health care provider or pharmacist if you have questions. COMMON BRAND NAME(S): XGEVA What should I tell my care team before I take this medication? They need to know if you have any of these conditions: Dental disease Having surgery or tooth extraction Infection Kidney disease Low levels of calcium or vitamin D in the blood Malnutrition On hemodialysis Skin conditions or sensitivity Thyroid or parathyroid disease An unusual reaction to denosumab, other medications, foods, dyes, or preservatives Pregnant or trying to get pregnant Breast-feeding How should I use this medication? This medication is for injection under the skin. It is given by your care team in a hospital or clinic setting. A special MedGuide will be given to you before each treatment. Be sure to read this information carefully each time. Talk to your care team about the use of this medication in children. While it may be prescribed for children as young as 13 years for selected conditions, precautions do apply. Overdosage: If you think you have taken too much of this medicine contact a poison control center or emergency room at once. NOTE: This medicine is only for you. Do not share this medicine with others. What if I miss a dose? Keep appointments for follow-up doses. It is important not to miss your dose. Call your care team if you are unable to  keep an appointment. What may interact with this medication? Do not take this medication with any of the following: Other medications containing denosumab This medication may also interact with the following: Medications that lower your chance of fighting infection Steroid medications, such as prednisone or cortisone This list may not describe all possible interactions. Give your health care provider a list of all the medicines, herbs, non-prescription drugs, or dietary supplements you use. Also tell them if you smoke, drink alcohol, or use illegal drugs. Some items may interact with your medicine. What should I watch for while using this medication? Your condition will be monitored carefully while you are receiving this medication. You may need blood work while taking this medication. This medication may increase your risk of getting an infection. Call your care team for advice if you get a fever, chills, sore throat, or other symptoms of a cold or flu. Do not treat yourself. Try to avoid being around people who are sick. You should make sure you get enough calcium and vitamin D while you are taking this medication, unless your care team tells you not to. Discuss the foods you eat and the vitamins you take with your care team. Some people who take this medication have severe bone, joint, or muscle pain. This medication may also increase your risk for jaw problems or a broken thigh bone. Tell your care team right away if you have severe pain in your jaw, bones, joints, or muscles. Tell your care team if you have any pain that does not go away or that gets worse. Talk   to your care team if you may be pregnant. Serious birth defects can occur if you take this medication during pregnancy and for 5 months after the last dose. You will need a negative pregnancy test before starting this medication. Contraception is recommended while taking this medication and for 5 months after the last dose. Your care team  can help you find the option that works for you. What side effects may I notice from receiving this medication? Side effects that you should report to your care team as soon as possible: Allergic reactions--skin rash, itching, hives, swelling of the face, lips, tongue, or throat Bone, joint, or muscle pain Low calcium level--muscle pain or cramps, confusion, tingling, or numbness in the hands or feet Osteonecrosis of the jaw--pain, swelling, or redness in the mouth, numbness of the jaw, poor healing after dental work, unusual discharge from the mouth, visible bones in the mouth Side effects that usually do not require medical attention (report to your care team if they continue or are bothersome): Cough Diarrhea Fatigue Headache Nausea This list may not describe all possible side effects. Call your doctor for medical advice about side effects. You may report side effects to FDA at 1-800-FDA-1088. Where should I keep my medication? This medication is given in a hospital or clinic. It will not be stored at home. NOTE: This sheet is a summary. It may not cover all possible information. If you have questions about this medicine, talk to your doctor, pharmacist, or health care provider.  2023 Elsevier/Gold Standard (2021-07-07 00:00:00)  

## 2022-06-02 ENCOUNTER — Other Ambulatory Visit: Payer: Self-pay | Admitting: Family Medicine

## 2022-06-02 ENCOUNTER — Encounter: Payer: Self-pay | Admitting: *Deleted

## 2022-06-02 DIAGNOSIS — Z1231 Encounter for screening mammogram for malignant neoplasm of breast: Secondary | ICD-10-CM

## 2022-06-02 NOTE — Progress Notes (Signed)
Patient needs a PET prior to her next provider appointment. Scheduled for 07/02/2022.  Patient is aware of PET appointment including date, time, and location. The following prep is reviewed with patient and confirmed with teachback: - arrive 30 minutes before appointment time - NPO except water for 6h before scan. No candy, no gum - hold any diabetic medication the morning of the scan - have a low carb dinner the night prior Radiology Information sheet also mailed to patient's home for reinforcement of education.   Oncology Nurse Navigator Documentation     06/02/2022   12:45 PM  Oncology Nurse Navigator Flowsheets  Navigator Follow Up Date: 07/03/2022  Navigator Follow Up Reason: Scan Review  Navigator Location [facility]  Navigator Encounter Type Appt/Treatment Plan Review;Telephone  Telephone Asess Navigation Needs;Education;Outgoing Call  Patient Visit Type MedOnc  Treatment Phase Active Tx  Barriers/Navigation Needs Coordination of Care  Interventions Coordination of Care;Education  Acuity Level 1-No Barriers  Coordination of Care Radiology  Education Method Verbal;Written  Support Groups/Services Friends and Family  Time Spent with Patient 30

## 2022-06-03 LAB — CANCER ANTIGEN 27.29: CA 27.29: 34.3 U/mL (ref 0.0–38.6)

## 2022-06-07 ENCOUNTER — Encounter: Payer: Self-pay | Admitting: Hematology & Oncology

## 2022-06-25 ENCOUNTER — Telehealth: Payer: Self-pay

## 2022-06-25 DIAGNOSIS — J45991 Cough variant asthma: Secondary | ICD-10-CM

## 2022-06-25 MED ORDER — FLUTICASONE-SALMETEROL 230-21 MCG/ACT IN AERO
INHALATION_SPRAY | RESPIRATORY_TRACT | 3 refills | Status: DC
Start: 2022-06-25 — End: 2023-12-06

## 2022-06-25 NOTE — Telephone Encounter (Signed)
Pt husband is requesting a refill on: ADVAIR [facility] 230-21 MCG/ACT inhaler   Pharmacy: [facility] - [place], [place] - [address]   LOV 04/23/22 ROV 10/22/22

## 2022-06-25 NOTE — Telephone Encounter (Signed)
Refill sent.

## 2022-07-01 ENCOUNTER — Encounter: Payer: Self-pay | Admitting: Hematology & Oncology

## 2022-07-02 ENCOUNTER — Ambulatory Visit (HOSPITAL_COMMUNITY)
Admission: RE | Admit: 2022-07-02 | Discharge: 2022-07-02 | Disposition: A | Discharge status: Home / Self Care | Payer: Medicare Other | Source: Ambulatory Visit | Attending: Hematology & Oncology | Admitting: Hematology & Oncology

## 2022-07-02 DIAGNOSIS — Z170 Estrogen receptor positive status [ER+]: Secondary | ICD-10-CM | POA: Diagnosis not present

## 2022-07-02 DIAGNOSIS — C50412 Malignant neoplasm of upper-outer quadrant of left female breast: Secondary | ICD-10-CM | POA: Diagnosis not present

## 2022-07-02 DIAGNOSIS — C50912 Malignant neoplasm of unspecified site of left female breast: Secondary | ICD-10-CM | POA: Diagnosis not present

## 2022-07-02 LAB — GLUCOSE, CAPILLARY: Glucose-Capillary: 118 mg/dL — ABNORMAL HIGH (ref 70–99)

## 2022-07-02 MED ORDER — FLUDEOXYGLUCOSE F - 18 (FDG) INJECTION
7.3000 | Freq: Once | INTRAVENOUS | Status: AC
  Administered 2022-07-02: 7.3 via INTRAVENOUS

## 2022-07-07 ENCOUNTER — Encounter: Payer: Self-pay | Admitting: *Deleted

## 2022-07-07 NOTE — Progress Notes (Signed)
Reviewed PET which unfortunately shows progressive bone mets. She is already scheduled for follow up tomorrow.  Oncology Nurse Navigator Documentation     07/07/2022    7:45 AM  Oncology Nurse Navigator Flowsheets  Navigator Follow Up Date: 07/08/2022  Navigator Follow Up Reason: Follow-up Appointment  Navigator Location [facility]  Navigator Encounter Type Scan Review  Patient Visit Type MedOnc  Treatment Phase Active Tx  Barriers/Navigation Needs Coordination of Care  Interventions None Required  Acuity Level 1-No Barriers  Support Groups/Services Friends and Family  Time Spent with Patient 15

## 2022-07-08 ENCOUNTER — Encounter: Payer: Self-pay | Admitting: Hematology & Oncology

## 2022-07-08 ENCOUNTER — Inpatient Hospital Stay (HOSPITAL_BASED_OUTPATIENT_CLINIC_OR_DEPARTMENT_OTHER): Payer: Medicare Other | Admitting: Hematology & Oncology

## 2022-07-08 ENCOUNTER — Inpatient Hospital Stay: Payer: Medicare Other | Attending: Hematology & Oncology

## 2022-07-08 ENCOUNTER — Ambulatory Visit (HOSPITAL_BASED_OUTPATIENT_CLINIC_OR_DEPARTMENT_OTHER)
Admission: RE | Admit: 2022-07-08 | Discharge: 2022-07-08 | Disposition: A | Discharge status: Home / Self Care | Payer: Medicare Other | Source: Ambulatory Visit | Attending: Hematology & Oncology | Admitting: Hematology & Oncology

## 2022-07-08 ENCOUNTER — Inpatient Hospital Stay: Payer: Medicare Other

## 2022-07-08 VITALS — BP 104/69 | HR 90 | Temp 98.4°F | Resp 28 | Ht 60.0 in | Wt 150.0 lb

## 2022-07-08 DIAGNOSIS — C7951 Secondary malignant neoplasm of bone: Secondary | ICD-10-CM

## 2022-07-08 DIAGNOSIS — I340 Nonrheumatic mitral (valve) insufficiency: Secondary | ICD-10-CM | POA: Diagnosis not present

## 2022-07-08 DIAGNOSIS — Z0389 Encounter for observation for other suspected diseases and conditions ruled out: Secondary | ICD-10-CM | POA: Diagnosis not present

## 2022-07-08 DIAGNOSIS — Z170 Estrogen receptor positive status [ER+]: Secondary | ICD-10-CM | POA: Insufficient documentation

## 2022-07-08 DIAGNOSIS — Z171 Estrogen receptor negative status [ER-]: Secondary | ICD-10-CM | POA: Insufficient documentation

## 2022-07-08 DIAGNOSIS — Z5112 Encounter for antineoplastic immunotherapy: Secondary | ICD-10-CM | POA: Diagnosis not present

## 2022-07-08 DIAGNOSIS — C50412 Malignant neoplasm of upper-outer quadrant of left female breast: Secondary | ICD-10-CM | POA: Insufficient documentation

## 2022-07-08 DIAGNOSIS — C50919 Malignant neoplasm of unspecified site of unspecified female breast: Secondary | ICD-10-CM | POA: Insufficient documentation

## 2022-07-08 LAB — CBC WITH DIFFERENTIAL (CANCER CENTER ONLY)
Abs Immature Granulocytes: 0.03 10*3/uL (ref 0.00–0.07)
Basophils Absolute: 0 10*3/uL (ref 0.0–0.1)
Basophils Relative: 0 %
Eosinophils Absolute: 0 10*3/uL (ref 0.0–0.5)
Eosinophils Relative: 0 %
HCT: 42.8 % (ref 36.0–46.0)
Hemoglobin: 14.4 g/dL (ref 12.0–15.0)
Immature Granulocytes: 1 %
Lymphocytes Relative: 16 %
Lymphs Abs: 1 10*3/uL (ref 0.7–4.0)
MCH: 32.7 pg (ref 26.0–34.0)
MCHC: 33.6 g/dL (ref 30.0–36.0)
MCV: 97.3 fL (ref 80.0–100.0)
Monocytes Absolute: 0.7 10*3/uL (ref 0.1–1.0)
Monocytes Relative: 11 %
Neutro Abs: 4.4 10*3/uL (ref 1.7–7.7)
Neutrophils Relative %: 72 %
Platelet Count: 231 10*3/uL (ref 150–400)
RBC: 4.4 MIL/uL (ref 3.87–5.11)
RDW: 13.3 % (ref 11.5–15.5)
WBC Count: 6.1 10*3/uL (ref 4.0–10.5)
nRBC: 0 % (ref 0.0–0.2)

## 2022-07-08 LAB — CMP (CANCER CENTER ONLY)
ALT: 15 U/L (ref 0–44)
AST: 21 U/L (ref 15–41)
Albumin: 4.2 g/dL (ref 3.5–5.0)
Alkaline Phosphatase: 80 U/L (ref 38–126)
Anion gap: 12 (ref 5–15)
BUN: 18 mg/dL (ref 8–23)
CO2: 28 mmol/L (ref 22–32)
Calcium: 9.3 mg/dL (ref 8.9–10.3)
Chloride: 102 mmol/L (ref 98–111)
Creatinine: 1.3 mg/dL — ABNORMAL HIGH (ref 0.44–1.00)
GFR, Estimated: 41 mL/min — ABNORMAL LOW (ref >60)
Glucose, Bld: 123 mg/dL — ABNORMAL HIGH (ref 70–99)
Potassium: 4 mmol/L (ref 3.5–5.1)
Sodium: 142 mmol/L (ref 135–145)
Total Bilirubin: 0.6 mg/dL (ref 0.3–1.2)
Total Protein: 7.1 g/dL (ref 6.5–8.1)

## 2022-07-08 MED ORDER — DENOSUMAB 120 MG/1.7ML ~~LOC~~ SOLN: 120.0000 mg | Freq: Once | SUBCUTANEOUS | Status: DC

## 2022-07-08 NOTE — Progress Notes (Signed)
Hematology and Oncology Follow Up Visit  Patricia [name] [id number] 1940/09/19 82 y.o. 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  Unfortunately, where the position that I think we will going to have to start treating her.  She did have a PET scan that was done.  This was done I think back on 07/04/2022.  This did show that she had increased osseous metastasis now.  There are 5 new areas of disease.  She did not have these before.  We have tried our best to hold off on treating her.  However, I think we are at a point where we have to.  Her tumor is HER2 positive (2+) so I think we can consider her for Enhertu.  She does not have any pain although there is some problems with the left hip.  She has had radiation to the left hip before.  There is a lesion in the left femoral shaft.  I would like to have her get a plain film of this area to make sure there is no cortical disruption.  She feels well otherwise.  Her last CA 27.29 was 34.  This was little bit higher than what she had been previously.  She has had no change in bowel or bladder habits.  She is eating well.  There is no bleeding.  She has had no headache.  Overall, I would have said that her performance status is probably ECOG 1.   Medications:  Current Outpatient Medications:    apixaban (ELIQUIS) 5 MG TABS tablet, TAKE 1 TABLET TWICE A DAY, Disp: 180 tablet, Rfl: 3   Apoaequorin (PREVAGEN) 10 MG CAPS, Take 1 capsule by mouth every morning., Disp: 90 capsule, Rfl: 3   ARMOUR THYROID 15 MG tablet, TAKE 1 TABLET DAILY ALONG WITH 60 MG, Disp: 90 tablet,  Rfl: 3   ARMOUR THYROID 60 MG tablet, TAKE 1 TABLET DAILY BEFORE BREAKFAST, Disp: 90 tablet, Rfl: 3   atorvastatin (LIPITOR) 40 MG tablet, Take 1 tablet (40 mg total) by mouth daily before supper., Disp: 90 tablet, Rfl: 3   bimatoprost (LUMIGAN) 0.01 % SOLN, Place 1 drop into both eyes 2 (two) times daily., Disp: , Rfl:    calcium-vitamin D (OSCAL WITH D) 500-200 MG-UNIT tablet, Take 1 tablet by mouth daily with breakfast., Disp: 30 tablet, Rfl: 0   diltiazem (CARDIZEM CD) 300 MG 24 hr capsule, Take 1 capsule (300 mg total) by mouth daily., Disp: 90 capsule, Rfl: 3   dorzolamide-timolol (COSOPT) 22.3-6.8 MG/ML ophthalmic solution, Place 1 drop into both eyes at bedtime., Disp: , Rfl:    fluticasone (FLONASE) 50 MCG/ACT nasal spray, 1 spray each nostril following sinus rinses twice daily, Disp: 16 g, Rfl: 2   fluticasone-salmeterol (ADVAIR HFA) 230-21 MCG/ACT inhaler, USE 2 INHALATIONS TWICE A DAY, Disp: 36 g, Rfl: 3   furosemide (LASIX) 40 MG tablet, TAKE 1 TABLET TWICE A DAY, Disp: 180 tablet, Rfl: 3   gabapentin (NEURONTIN) 400 MG capsule, TAKE 1 CAPSULE TWICE A DAY, Disp: 180 capsule, Rfl: 3   L-Methylfolate-B12-B6-B2 (CEREFOLIN) 07-31-48-5 MG TABS, Take 1 tablet by mouth every morning., Disp:  90 tablet, Rfl: 3   levETIRAcetam (KEPPRA XR) 500 MG 24 hr tablet, Take 1 tablet (500 mg total) by mouth at bedtime., Disp: 90 tablet, Rfl: 3   loratadine (CLARITIN) 10 MG tablet, Take 10 mg by mouth daily., Disp: , Rfl:    memantine (NAMENDA) 10 MG tablet, Take 1 tablet (10 mg total) by mouth 2 (two) times daily., Disp: 180 tablet, Rfl: 3   metoprolol succinate (TOPROL-XL) 50 MG 24 hr tablet, Take 1.5 tablets (75 mg total) by mouth daily. Take with or immediately following a meal., Disp: 135 tablet, Rfl: 2   Multiple Vitamins-Iron (MULTIVITAMIN/IRON) TABS, Take 1 tablet by mouth daily., Disp: , Rfl:    Netarsudil Dimesylate (RHOPRESSA) 0.02 % SOLN, Place 1 drop into both eyes at bedtime., Disp: , Rfl:     pantoprazole (PROTONIX) 40 MG tablet, TAKE 1 TABLET DAILY, Disp: 90 tablet, Rfl: 3   potassium chloride SA (KLOR-CON M) 20 MEQ tablet, TAKE 1 TABLET TWICE A DAY, Disp: 180 tablet, Rfl: 3   traMADol (ULTRAM) 50 MG tablet, Take 1 tablet (50 mg total) by mouth every 6 (six) hours as needed., Disp: 90 tablet, Rfl: 0   Vitamin D, Ergocalciferol, (DRISDOL) 1.25 MG (50000 UNIT) CAPS capsule, TAKE 1 CAPSULE WEEKLY, Disp: 12 capsule, Rfl: 3   albuterol (PROVENTIL HFA;VENTOLIN HFA) 108 (90 Base) MCG/ACT inhaler, Inhale 2 puffs into the lungs every 6 (six) hours as needed for wheezing or shortness of breath. (Patient not taking: Reported on 07/08/2022), Disp: , Rfl:   Allergies:  Allergies  Allergen Reactions   Combigan [Brimonidine Tartrate-Timolol] Itching    Eyes itch, reddened   Other Other (See Comments)    Per patient made OU red, Sore, and sensitivity to light   Sulfa Antibiotics Rash   Sulfamethoxazole-Trimethoprim Hives    Past Medical History, Surgical history, Social history, and Family History were reviewed and updated.  Review of Systems: Review of Systems  Constitutional: Negative.   HENT:  Negative.    Eyes: Negative.   Respiratory: Negative.    Cardiovascular: Negative.   Gastrointestinal: Negative.   Endocrine: Negative.   Genitourinary: Negative.    Musculoskeletal: Negative.   Skin: Negative.   Neurological: Negative.   Hematological: Negative.   Psychiatric/Behavioral: Negative.      Physical Exam:  height is 5' (1.524 m) and weight is 150 lb (68 kg). Her oral temperature is 98.4 F (36.9 C). Her blood pressure is 104/69 and her pulse is 90. Her respiration is 28 (abnormal) and oxygen saturation is 93%.   Wt Readings from Last 3 Encounters:  07/08/22 150 lb (68 kg)  06/01/22 146 lb (66.2 kg)  05/05/22 148 lb 3.2 oz (67.2 kg)    Physical Exam Vitals reviewed.  HENT:     Head: Normocephalic and atraumatic.  Eyes:     Pupils: Pupils are equal, round, and  reactive to light.  Cardiovascular:     Rate and Rhythm: Normal rate and regular rhythm.     Heart sounds: Normal heart sounds.  Pulmonary:     Effort: Pulmonary effort is normal.     Breath sounds: Normal breath sounds.  Abdominal:     General: Bowel sounds are normal.     Palpations: Abdomen is soft.  Musculoskeletal:        General: No tenderness or deformity. Normal range of motion.     Cervical back: Normal range of motion.  Lymphadenopathy:     Cervical: No cervical adenopathy.  Skin:  General: Skin is warm and dry.     Findings: No erythema or rash.  Neurological:     Mental Status: She is alert and oriented to person, place, and time.  Psychiatric:        Behavior: Behavior normal.        Thought Content: Thought content normal.        Judgment: Judgment normal.      Lab Results  Component Value Date   WBC 6.1 07/08/2022   HGB 14.4 07/08/2022   HCT 42.8 07/08/2022   MCV 97.3 07/08/2022   PLT 231 07/08/2022     Chemistry      Component Value Date/Time   NA 142 07/08/2022 1515   NA 143 10/09/2021 1056   NA 141 09/22/2016 1459   K 4.0 07/08/2022 1515   K 3.8 09/22/2016 1459   CL 102 07/08/2022 1515   CO2 28 07/08/2022 1515   CO2 26 09/22/2016 1459   BUN 18 07/08/2022 1515   BUN 17 10/09/2021 1056   BUN 19.8 09/22/2016 1459   CREATININE 1.30 (H) 07/08/2022 1515   CREATININE 1.1 09/22/2016 1459      Component Value Date/Time   CALCIUM 9.3 07/08/2022 1515   CALCIUM 9.2 09/22/2016 1459   ALKPHOS 80 07/08/2022 1515   ALKPHOS 98 09/22/2016 1459   AST 21 07/08/2022 1515   AST 15 09/22/2016 1459   ALT 15 07/08/2022 1515   ALT 21 09/22/2016 1459   BILITOT 0.6 07/08/2022 1515   BILITOT 0.65 09/22/2016 1459      Impression and Plan: Ms. Dunnaway is a very nice 82 year old postmenopausal white female.  She has a solitary metastatic focus from breast cancer.  Her initial breast cancer was probably 7 years ago.  It was estrogen positive.  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  The HER2 was equivocal on initial staining but on FISH was negative.  I still think that we can consider her for Enhertu.  I think this would be reasonable.  I would like to try to avoid chemotherapy.  This is all about quality of life.  I talked to she and her husband about the Enhertu.  I think we may have to get an echocardiogram on her.  I think this would be reasonable as a baseline.  Her last echocardiogram was in November 2023.  She does have atrial fibrillation.  Again, I think is reasonable to repeat the echocardiogram.  I will get plain films of her left femur today.  I we will start treatment on her next week.  We will give her probably 3 or 4 cycles of treatment and then repeat her scan.  I suppose that the CA 27.2 and I will also show [name] how well she is doing.  She will be due for Xgeva in June.  Josph Macho, MD 5/8/20244:49 PM

## 2022-07-08 NOTE — Progress Notes (Signed)
START OFF PATHWAY REGIMEN - Breast   [id number]:Fam-trastuzumab deruxtecan-nxki 5.4 mg/kg IV D1 q21 Days:   A cycle is every 21 days:     Fam-trastuzumab deruxtecan-nxki   **Always confirm dose/schedule in your pharmacy ordering system**  Patient Characteristics: Distant Metastases or Locoregional Recurrent Disease - Unresected, M0 or Locally Advanced Unresectable Disease Progressing after Neoadjuvant and Local Therapies, M0, HER2 Positive, ER Negative, Chemotherapy, First Line Therapeutic Status: Distant Metastases HER2 Status: Positive (+) ER Status: Negative (-) PR Status: Negative (-) Line of Therapy: First Line Intent of Therapy: Non-Curative / Palliative Intent, Discussed with Patient

## 2022-07-09 ENCOUNTER — Other Ambulatory Visit: Payer: Self-pay

## 2022-07-09 ENCOUNTER — Encounter: Payer: Self-pay | Admitting: Hematology & Oncology

## 2022-07-09 LAB — CANCER ANTIGEN 27.29: CA 27.29: 47.7 U/mL — ABNORMAL HIGH (ref 0.0–38.6)

## 2022-07-11 ENCOUNTER — Encounter: Payer: Self-pay | Admitting: Hematology & Oncology

## 2022-07-13 ENCOUNTER — Encounter: Payer: Self-pay | Admitting: Hematology & Oncology

## 2022-07-13 NOTE — Telephone Encounter (Signed)
Talked with patients husband and informed him of xray results, the surgeon they saw before was Hoyt Koch at [facility]. MD aware and will work on referral to an orthopedic surgeon.

## 2022-07-14 ENCOUNTER — Encounter: Payer: Self-pay | Admitting: *Deleted

## 2022-07-14 ENCOUNTER — Other Ambulatory Visit: Payer: Self-pay | Admitting: Orthopaedic Surgery

## 2022-07-14 DIAGNOSIS — C7951 Secondary malignant neoplasm of bone: Secondary | ICD-10-CM

## 2022-07-14 DIAGNOSIS — Z170 Estrogen receptor positive status [ER+]: Secondary | ICD-10-CM

## 2022-07-14 DIAGNOSIS — I340 Nonrheumatic mitral (valve) insufficiency: Secondary | ICD-10-CM

## 2022-07-14 NOTE — Progress Notes (Signed)
Patient will need to begin systemic treatment due to PET showing progression. She will also need referral to ortho due to impending pathological fracture.   Referral to Dr Jerl Santos placed.   Oncology Nurse Navigator Documentation     07/14/2022    7:45 AM  Oncology Nurse Navigator Flowsheets  Navigator Follow Up Date: 07/20/2022  Navigator Follow Up Reason: Chemotherapy  Navigator Location [facility]  Navigator Encounter Type Appt/Treatment Plan Review  Patient Visit Type MedOnc  Treatment Phase Active Tx  Barriers/Navigation Needs Coordination of Care  Interventions Referrals  Acuity Level 1-No Barriers  Referrals Other  Support Groups/Services Friends and Family  Time Spent with Patient 15

## 2022-07-15 ENCOUNTER — Telehealth: Payer: Self-pay

## 2022-07-15 ENCOUNTER — Telehealth: Payer: Self-pay | Admitting: Cardiology

## 2022-07-15 NOTE — Telephone Encounter (Signed)
Spoke with patient's husband (DPR on file) who 9is agreeable for patient to have a tele visit on 5/23 at 10 am. Med rec and consent done.

## 2022-07-15 NOTE — Telephone Encounter (Signed)
  Patient Consent for Virtual Visit        Patricia [name] has provided verbal consent on 07/15/2022 for a virtual visit (video or telephone).   CONSENT FOR VIRTUAL VISIT FOR:  Patricia [name]  By participating in this virtual visit I agree to the following:  I hereby voluntarily request, consent and authorize [facility] and its employed or contracted physicians, physician assistants, nurse practitioners or other licensed health care professionals (the Practitioner), to provide me with telemedicine health care services (the "Services") as deemed necessary by the treating Practitioner. I acknowledge and consent to receive the Services by the Practitioner via telemedicine. I understand that the telemedicine visit will involve communicating with the Practitioner through live audiovisual communication technology and the disclosure of certain medical information by electronic transmission. I acknowledge that I have been given the opportunity to request an in-person assessment or other available alternative prior to the telemedicine visit and am voluntarily participating in the telemedicine visit.  I understand that I have the right to withhold or withdraw my consent to the use of telemedicine in the course of my care at any time, without affecting my right to future care or treatment, and that the Practitioner or I may terminate the telemedicine visit at any time. I understand that I have the right to inspect all information obtained and/or recorded in the course of the telemedicine visit and may receive copies of available information for a reasonable fee.  I understand that some of the potential risks of receiving the Services via telemedicine include:  Delay or interruption in medical evaluation due to technological equipment failure or disruption; Information transmitted may not be sufficient (e.g. poor resolution of images) to allow for appropriate medical decision making by the Practitioner;  and/or  In rare instances, security protocols could fail, causing a breach of personal health information.  Furthermore, I acknowledge that it is my responsibility to provide information about my medical history, conditions and care that is complete and accurate to the best of my ability. I acknowledge that Practitioner's advice, recommendations, and/or decision may be based on factors not within their control, such as incomplete or inaccurate data provided by me or distortions of diagnostic images or specimens that may result from electronic transmissions. I understand that the practice of medicine is not an exact science and that Practitioner makes no warranties or guarantees regarding treatment outcomes. I acknowledge that a copy of this consent can be made available to me via my patient portal [facility] MyChart), or I can request a printed copy by calling the office of [facility].    I understand that my insurance will be billed for this visit.   I have read or had this consent read to me. I understand the contents of this consent, which adequately explains the benefits and risks of the Services being provided via telemedicine.  I have been provided ample opportunity to ask questions regarding this consent and the Services and have had my questions answered to my satisfaction. I give my informed consent for the services to be provided through the use of telemedicine in my medical care

## 2022-07-15 NOTE — Telephone Encounter (Signed)
   Pre-operative Risk Assessment    Patient Name: Patricia [name]  DOB: 07-03-1940 MRN: [id number]      Request for Surgical Clearance    Procedure:  Left Femur IM Nail Placement  Date of Surgery:  Clearance   07-28-22                               Surgeon:  Dr Gean Birchwood Surgeon's Group or Practice Name:   Phone number:  [phone] Fax number:  [phone]   Type of Clearance Requested:   Medicine- Eliquis and Medical      Type of Anesthesia:  Choice   Additional requests/questions:    Signed, Laurence Ferrari   07/15/2022, 2:08 PM

## 2022-07-15 NOTE — Telephone Encounter (Signed)
Primary Cardiologist:Traci Mayford Knife, MD   Preoperative team, please contact this patient and set up a phone call appointment for further preoperative risk assessment. Please obtain consent and complete medication review. Thank you for your help.   I confirm that guidance regarding antiplatelet and oral anticoagulation therapy has been completed and, if necessary, noted below.   Levi Aland, NP-C  07/15/2022, 4:47 PM [address] Office [phone] Fax [phone]

## 2022-07-15 NOTE — Telephone Encounter (Signed)
Patient with diagnosis of afib on Eliquis for anticoagulation.    Procedure: Left Femur IM Nail Placement  Date of procedure: 07/28/22   CHA2DS2-VASc Score = 7   This indicates a 11.2% annual risk of stroke. The patient's score is based upon: CHF History: 1 HTN History: 1 Diabetes History: 0 Stroke History: 2 Vascular Disease History: 0 Age Score: 2 Gender Score: 1      Patient with hx of stroke in 2017 prior to dx of afib. She has been previously cleared to hold Eliquis [name] to 3 days if she understands the stroke risk.  CrCl 30 ml/min  Per office protocol, patient can hold Eliquis for 3 days prior to procedure.    **This guidance is not considered finalized until pre-operative APP has relayed final recommendations.**

## 2022-07-16 ENCOUNTER — Ambulatory Visit
Admission: RE | Admit: 2022-07-16 | Discharge: 2022-07-16 | Disposition: A | Discharge status: Home / Self Care | Payer: Medicare Other | Source: Ambulatory Visit | Attending: Family Medicine | Admitting: Family Medicine

## 2022-07-16 DIAGNOSIS — Z1231 Encounter for screening mammogram for malignant neoplasm of breast: Secondary | ICD-10-CM | POA: Diagnosis not present

## 2022-07-20 ENCOUNTER — Encounter: Payer: Self-pay | Admitting: *Deleted

## 2022-07-20 ENCOUNTER — Inpatient Hospital Stay: Payer: Medicare Other

## 2022-07-20 VITALS — BP 103/69 | HR 102 | Temp 98.1°F | Resp 19

## 2022-07-20 DIAGNOSIS — C7951 Secondary malignant neoplasm of bone: Secondary | ICD-10-CM

## 2022-07-20 DIAGNOSIS — C50412 Malignant neoplasm of upper-outer quadrant of left female breast: Secondary | ICD-10-CM | POA: Diagnosis not present

## 2022-07-20 DIAGNOSIS — Z170 Estrogen receptor positive status [ER+]: Secondary | ICD-10-CM

## 2022-07-20 DIAGNOSIS — Z5112 Encounter for antineoplastic immunotherapy: Secondary | ICD-10-CM | POA: Diagnosis not present

## 2022-07-20 LAB — CMP (CANCER CENTER ONLY)
ALT: 15 U/L (ref 0–44)
AST: 22 U/L (ref 15–41)
Albumin: 4.2 g/dL (ref 3.5–5.0)
Alkaline Phosphatase: 84 U/L (ref 38–126)
Anion gap: 7 (ref 5–15)
BUN: 19 mg/dL (ref 8–23)
CO2: 28 mmol/L (ref 22–32)
Calcium: 9.7 mg/dL (ref 8.9–10.3)
Chloride: 105 mmol/L (ref 98–111)
Creatinine: 1.12 mg/dL — ABNORMAL HIGH (ref 0.44–1.00)
GFR, Estimated: 49 mL/min — ABNORMAL LOW (ref >60)
Glucose, Bld: 106 mg/dL — ABNORMAL HIGH (ref 70–99)
Potassium: 3.6 mmol/L (ref 3.5–5.1)
Sodium: 140 mmol/L (ref 135–145)
Total Bilirubin: 0.5 mg/dL (ref 0.3–1.2)
Total Protein: 7.1 g/dL (ref 6.5–8.1)

## 2022-07-20 LAB — CBC WITH DIFFERENTIAL (CANCER CENTER ONLY)
Abs Immature Granulocytes: 0.03 10*3/uL (ref 0.00–0.07)
Basophils Absolute: 0 10*3/uL (ref 0.0–0.1)
Basophils Relative: 0 %
Eosinophils Absolute: 0 10*3/uL (ref 0.0–0.5)
Eosinophils Relative: 0 %
HCT: 44.3 % (ref 36.0–46.0)
Hemoglobin: 14.8 g/dL (ref 12.0–15.0)
Immature Granulocytes: 1 %
Lymphocytes Relative: 19 %
Lymphs Abs: 1.2 10*3/uL (ref 0.7–4.0)
MCH: 32.5 pg (ref 26.0–34.0)
MCHC: 33.4 g/dL (ref 30.0–36.0)
MCV: 97.4 fL (ref 80.0–100.0)
Monocytes Absolute: 0.7 10*3/uL (ref 0.1–1.0)
Monocytes Relative: 11 %
Neutro Abs: 4.5 10*3/uL (ref 1.7–7.7)
Neutrophils Relative %: 69 %
Platelet Count: 209 10*3/uL (ref 150–400)
RBC: 4.55 MIL/uL (ref 3.87–5.11)
RDW: 13.3 % (ref 11.5–15.5)
WBC Count: 6.5 10*3/uL (ref 4.0–10.5)
nRBC: 0 % (ref 0.0–0.2)

## 2022-07-20 MED ORDER — ONDANSETRON HCL 8 MG PO TABS
8.0000 mg | ORAL_TABLET | Freq: Three times a day (TID) | ORAL | 1 refills | Status: DC | PRN
Start: 2022-07-20 — End: 2022-07-21

## 2022-07-20 MED ORDER — DIPHENHYDRAMINE HCL 25 MG PO CAPS: 50.0000 mg | ORAL_CAPSULE | Freq: Once | ORAL | Status: AC

## 2022-07-20 MED ORDER — SODIUM CHLORIDE 0.9 % IV SOLN
150.0000 mg | Freq: Once | INTRAVENOUS | Status: AC
  Filled 2022-07-20: qty 5

## 2022-07-20 MED ORDER — FAM-TRASTUZUMAB DERUXTECAN-NXKI CHEMO 100 MG IV SOLR: 5.4000 mg/kg | Freq: Once | INTRAVENOUS | Status: DC

## 2022-07-20 MED ORDER — DEXAMETHASONE 4 MG PO TABS
ORAL_TABLET | ORAL | 1 refills | Status: DC
Start: 2022-07-20 — End: 2022-07-21

## 2022-07-20 MED ORDER — DEXTROSE 5 % IV SOLN: Freq: Once | INTRAVENOUS | Status: AC

## 2022-07-20 MED ORDER — ACETAMINOPHEN 325 MG PO TABS: 650.0000 mg | ORAL_TABLET | Freq: Once | ORAL | Status: AC

## 2022-07-20 MED ORDER — PALONOSETRON HCL INJECTION 0.25 MG/5ML: 0.2500 mg | Freq: Once | INTRAVENOUS | Status: AC

## 2022-07-20 MED ORDER — PROCHLORPERAZINE MALEATE 10 MG PO TABS
10.0000 mg | ORAL_TABLET | Freq: Four times a day (QID) | ORAL | 1 refills | Status: DC | PRN
Start: 2022-07-20 — End: 2022-07-21

## 2022-07-20 MED ORDER — SODIUM CHLORIDE 0.9 % IV SOLN
10.0000 mg | Freq: Once | INTRAVENOUS | Status: AC
  Filled 2022-07-20: qty 1

## 2022-07-20 NOTE — Progress Notes (Unsigned)
Pt echo is scheduled for 5/21/.Reviewed with Maralyn Sago, NP (MD out of office) regarding  rescheduling treatment for after echo is complete. Message to scheduling for pt to come back Wednesday 5/22 as pt has pre-op appts on fhurs/Friday this week or upcoming surgery on Tuesday 5/28

## 2022-07-20 NOTE — Progress Notes (Signed)
Patient in chemotherapy education class with  husband in infusion room to being Enhertu.  Discussed side effects of Enhertu  which include but are not limited to myelosuppression, decreased appetite, fatigue, fever, allergic or infusional reaction, mucositis, cardiac toxicity, cough, SOB, altered taste, nausea and vomiting, diarrhea, constipation, rash, skin dryness, nail changes, peripheral neuropathy, delayed wound healing, mental changes (Chemo brain), increased risk of infections, weight loss.  Reviewed infusion room and office policy and procedure and phone numbers 24 hours x 7 days a week.  Reviewed when to call the office with any concerns or problems.  Transport planner given.  Discussed portacath insertion and EMLA cream administration.  Antiemetic protocol and chemotherapy schedule reviewed. Patient verbalized understanding of chemotherapy indications and possible side effects.  Teachback done

## 2022-07-20 NOTE — Patient Instructions (Signed)
Fam-Trastuzumab Deruxtecan Injection What is this medication? FAM-TRASTUZUMAB DERUXTECAN (fam-tras TOOZ eu mab DER ux TEE kan) treats some types of cancer. It works by blocking a protein that causes cancer cells to grow and multiply. This helps to slow or stop the spread of cancer cells. This medicine may be used for other purposes; ask your health care provider or pharmacist if you have questions. COMMON BRAND NAME(S): ENHERTU What should I tell my care team before I take this medication? They need to know if you have any of these conditions: Heart disease Heart failure Infection, especially a viral infection, such as chickenpox, cold sores, or herpes Liver disease Lung or breathing disease, such as asthma or COPD An unusual or allergic reaction to fam-trastuzumab deruxtecan, other medications, foods, dyes, or preservatives Pregnant or trying to get pregnant Breast-feeding How should I use this medication? This medication is injected into a vein. It is given by your care team in a hospital or clinic setting. A special MedGuide will be given to you before each treatment. Be sure to read this information carefully each time. Talk to your care team about the use of this medication in children. Special care may be needed. Overdosage: If you think you have taken too much of this medicine contact a poison control center or emergency room at once. NOTE: This medicine is only for you. Do not share this medicine with others. What if I miss a dose? It is important not to miss your dose. Call your care team if you are unable to keep an appointment. What may interact with this medication? Interactions are not expected. This list may not describe all possible interactions. Give your health care provider a list of all the medicines, herbs, non-prescription drugs, or dietary supplements you use. Also tell them if you smoke, drink alcohol, or use illegal drugs. Some items may interact with your  medicine. What should I watch for while using this medication? Visit your care team for regular checks on your progress. Tell your care team if your symptoms do not start to get better or if they get worse. Your condition will be monitored carefully while you are receiving this medication. Do not become pregnant while taking this medication or for 7 months after stopping it. Women should inform their care team if they wish to become pregnant or think they might be pregnant. Men should not father a child while taking this medication and for 4 months after stopping it. There is potential for serious side effects to an unborn child. Talk to your care team for more information. Do not breast-feed an infant while taking this medication or for 7 months after the last dose. This medication has caused decreased sperm counts in some men. This may make it more difficult to father a child. Talk to your care team if you are concerned about your fertility. This medication may increase your risk to bruise or bleed. Call your care team if you notice any unusual bleeding. Be careful brushing or flossing your teeth or using a toothpick because you may get an infection or bleed more easily. If you have any dental work done, tell your dentist you are receiving this medication. This medication may cause dry eyes and blurred vision. If you wear contact lenses, you may feel some discomfort. Lubricating eye drops may help. See your care team if the problem does not go away or is severe. This medication may increase your risk of getting an infection. Call your care team for   advice if you get a fever, chills, sore throat, or other symptoms of a cold or flu. Do not treat yourself. Try to avoid being around people who are sick. Avoid taking medications that contain aspirin, acetaminophen, ibuprofen, naproxen, or ketoprofen unless instructed by your care team. These medications may hide a fever. What side effects may I notice from  receiving this medication? Side effects that you should report to your care team as soon as possible: Allergic reactions--skin rash, itching, hives, swelling of the face, lips, tongue, or throat Dry cough, shortness of breath or trouble breathing Infection--fever, chills, cough, sore throat, wounds that don't heal, pain or trouble when passing urine, general feeling of discomfort or being unwell Heart failure--shortness of breath, swelling of the ankles, feet, or hands, sudden weight gain, unusual weakness or fatigue Unusual bruising or bleeding Side effects that usually do not require medical attention (report these to your care team if they continue or are bothersome): Constipation Diarrhea Hair loss Muscle pain Nausea Vomiting This list may not describe all possible side effects. Call your doctor for medical advice about side effects. You may report side effects to FDA at 1-800-FDA-1088. Where should I keep my medication? This medication is given in a hospital or clinic. It will not be stored at home. NOTE: This sheet is a summary. It may not cover all possible information. If you have questions about this medicine, talk to your doctor, pharmacist, or health care provider.  2023 Elsevier/Gold Standard (2020-10-30 00:00:00) 

## 2022-07-21 ENCOUNTER — Encounter: Payer: Self-pay | Admitting: Hematology & Oncology

## 2022-07-21 ENCOUNTER — Other Ambulatory Visit: Payer: Self-pay | Admitting: *Deleted

## 2022-07-21 ENCOUNTER — Ambulatory Visit (HOSPITAL_COMMUNITY): Payer: Medicare Other | Attending: Cardiology

## 2022-07-21 DIAGNOSIS — Z170 Estrogen receptor positive status [ER+]: Secondary | ICD-10-CM | POA: Diagnosis not present

## 2022-07-21 DIAGNOSIS — C7951 Secondary malignant neoplasm of bone: Secondary | ICD-10-CM

## 2022-07-21 DIAGNOSIS — I340 Nonrheumatic mitral (valve) insufficiency: Secondary | ICD-10-CM | POA: Insufficient documentation

## 2022-07-21 DIAGNOSIS — C50412 Malignant neoplasm of upper-outer quadrant of left female breast: Secondary | ICD-10-CM | POA: Diagnosis not present

## 2022-07-21 LAB — ECHOCARDIOGRAM COMPLETE
Area-P 1/2: 4.67 cm2
P 1/2 time: 389 msec
S' Lateral: 2.4 cm

## 2022-07-21 MED ORDER — DEXAMETHASONE 4 MG PO TABS
ORAL_TABLET | ORAL | 1 refills | Status: DC
Start: 2022-07-21 — End: 2022-11-09

## 2022-07-21 MED ORDER — PROCHLORPERAZINE MALEATE 10 MG PO TABS
10.0000 mg | ORAL_TABLET | Freq: Four times a day (QID) | ORAL | 1 refills | Status: DC | PRN
Start: 2022-07-21 — End: 2023-09-09

## 2022-07-21 MED ORDER — ONDANSETRON HCL 8 MG PO TABS
8.0000 mg | ORAL_TABLET | Freq: Three times a day (TID) | ORAL | 1 refills | Status: DC | PRN
Start: 2022-07-21 — End: 2023-09-09

## 2022-07-22 ENCOUNTER — Inpatient Hospital Stay: Payer: Medicare Other

## 2022-07-22 ENCOUNTER — Encounter: Payer: Self-pay | Admitting: *Deleted

## 2022-07-22 VITALS — BP 95/62 | HR 77 | Temp 98.0°F | Resp 18

## 2022-07-22 DIAGNOSIS — Z5112 Encounter for antineoplastic immunotherapy: Secondary | ICD-10-CM | POA: Diagnosis not present

## 2022-07-22 DIAGNOSIS — Z170 Estrogen receptor positive status [ER+]: Secondary | ICD-10-CM | POA: Diagnosis not present

## 2022-07-22 DIAGNOSIS — C7951 Secondary malignant neoplasm of bone: Secondary | ICD-10-CM | POA: Diagnosis not present

## 2022-07-22 DIAGNOSIS — C50412 Malignant neoplasm of upper-outer quadrant of left female breast: Secondary | ICD-10-CM | POA: Diagnosis not present

## 2022-07-22 MED ORDER — ACETAMINOPHEN 325 MG PO TABS
650.0000 mg | ORAL_TABLET | Freq: Once | ORAL | Status: AC
  Administered 2022-07-22: 650 mg via ORAL
  Filled 2022-07-22: qty 2

## 2022-07-22 MED ORDER — FAM-TRASTUZUMAB DERUXTECAN-NXKI CHEMO 100 MG IV SOLR
5.4000 mg/kg | Freq: Once | INTRAVENOUS | Status: AC
  Administered 2022-07-22: 400 mg via INTRAVENOUS
  Filled 2022-07-22: qty 20

## 2022-07-22 MED ORDER — HEPARIN SOD (PORK) LOCK FLUSH 100 UNIT/ML IV SOLN: 500.0000 [IU] | Freq: Once | INTRAVENOUS | Status: DC | PRN

## 2022-07-22 MED ORDER — DIPHENHYDRAMINE HCL 25 MG PO CAPS
50.0000 mg | ORAL_CAPSULE | Freq: Once | ORAL | Status: AC
  Administered 2022-07-22: 50 mg via ORAL
  Filled 2022-07-22 (×2): qty 2

## 2022-07-22 MED ORDER — SODIUM CHLORIDE 0.9% FLUSH: 10.0000 mL | INTRAVENOUS | Status: DC | PRN

## 2022-07-22 MED ORDER — SODIUM CHLORIDE 0.9 % IV SOLN
10.0000 mg | Freq: Once | INTRAVENOUS | Status: AC
  Administered 2022-07-22: 10 mg via INTRAVENOUS
  Filled 2022-07-22: qty 10

## 2022-07-22 MED ORDER — PALONOSETRON HCL INJECTION 0.25 MG/5ML
0.2500 mg | Freq: Once | INTRAVENOUS | Status: AC
  Administered 2022-07-22: 0.25 mg via INTRAVENOUS
  Filled 2022-07-22: qty 5

## 2022-07-22 MED ORDER — DEXTROSE 5 % IV SOLN: Freq: Once | INTRAVENOUS | Status: AC

## 2022-07-22 MED ORDER — SODIUM CHLORIDE 0.9 % IV SOLN
150.0000 mg | Freq: Once | INTRAVENOUS | Status: AC
  Administered 2022-07-22: 150 mg via INTRAVENOUS
  Filled 2022-07-22: qty 150

## 2022-07-22 NOTE — Progress Notes (Unsigned)
Virtual Visit via Telephone Note   Because of Patricia [name] co-morbid illnesses, she is at least at moderate risk for complications without adequate follow up.  This format is felt to be most appropriate for this patient at this time.  The patient did not have access to video technology/had technical difficulties with video requiring transitioning to audio format only (telephone).  All issues noted in this document were discussed and addressed.  No physical exam could be performed with this format.  Please refer to the patient's chart for her consent to telehealth for [facility].  Evaluation Performed:  Preoperative cardiovascular risk assessment _____________   Date:  07/22/2022   Patient ID:  Patricia [name], DOB 1941-01-13, MRN [id number] Patient Location:  Home Provider location:   Office  Primary Care Provider:  [name], PA Primary Cardiologist:  Armanda Magic, MD  Chief Complaint / Patient Profile   82 y.o. y/o female with a h/o chronic AFib on Eliquis (amiodarone stopped because of prolonged QT), CVA, chronic diastolic CHF, mild  AI, normal NST 2017, lung CA resection 2003, breast CA s/p lumpectomy/Tamoxifen/XRT, HTN, HLD  who is pending left femur intramedullary nail placement and presents today for telephonic preoperative cardiovascular risk assessment.  History of Present Illness    Patricia [name] is a 82 y.o. female who presents via audio/video conferencing for a telehealth visit today.  Pt was last seen in cardiology clinic on 03/30/2022 by Dr. Mayford Knife.  At that time Patricia [name] was doing well with complaint of some mild lower extremity edema. Patient shortness of breath was stable during visit. The patient is now pending procedure as outlined above. Since her last visit, she has been doing better and has followed up with her pulmonologist regarding her shortness of breath.  She has difficulty with ambulation due to her left hip socket pain.  Per husband she is able  to ambulate assisted with a cane and can complete ADLs with assistance.  She suffers from short-term memory loss and relies on her husband for majority of her ADLs.  She denies chest pain, shortness of breath, lower extremity edema, fatigue, palpitations, melena, hematuria, hemoptysis, diaphoresis, weakness, presyncope, syncope, orthopnea, and PND.    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 Dx 04/2015 at time of stroke. b. DCCV 06/2015 - did not hold. Amio started then stopped due to QT prolongation.   Chronic cough    Chronic diastolic heart failure (HCC) 06/17/2015   CKD (chronic kidney disease), stage III (HCC)    Cough variant asthma    Disease of pharynx or nasopharynx    Diverticulosis    Enlargement of right atrium 01/23/2020   Essential hypertension    GERD (gastroesophageal reflux disease)    Glaucoma    Hiatal hernia    Hyperlipidemia    Internal hemorrhoids    Laryngospasm    Mitral regurgitation    mild to moderate by echo 12/2019 and moderate by TEE 12/2020   Multinodular goiter    Osteopenia    Postmenopausal    Pulmonary HTN (HCC)     Moderate with PASP by echo 2018 likely Group 2 from pulmonary venous HTN  from CHF>>normal PAP by echo 12/2019   Radiation 10/22/14-11/23/14   Left Breast   Stricture and stenosis of cervix    Past Surgical History:  Procedure Laterality Date   BREAST LUMPECTOMY Left    BREAST SURGERY     CARDIOVERSION N/A 06/24/2015   Procedure: CARDIOVERSION;  Surgeon: Quintella Reichert, MD;  Location: [facility];  Service: Cardiovascular;  Laterality: N/A;   CARDIOVERSION N/A 05/19/2016   Procedure: CARDIOVERSION;  Surgeon: Quintella Reichert, MD;  Location: [facility];  Service: Cardiovascular;   Laterality: N/A;   COLONOSCOPY     LUNG CANCER SURGERY  2003   resection carcinoid lingula-lt upper lobe   RADIOACTIVE SEED GUIDED PARTIAL MASTECTOMY WITH AXILLARY SENTINEL LYMPH NODE BIOPSY Left 06/26/2014   Procedure: RADIOACTIVE SEED GUIDED PARTIAL MASTECTOMY WITH AXILLARY SENTINEL LYMPH NODE BIOPSY;  Surgeon: Almond Lint, MD;  Location: [facility];  Service: General;  Laterality: Left;   RE-EXCISION OF BREAST LUMPECTOMY Left 08/07/2014   Procedure: RE-EXCISION OF LEFT BREAST LUMPECTOMY;  Surgeon: Almond Lint, MD;  Location: [facility];  Service: General;  Laterality: Left;   RIGHT HEART CATH N/A 04/27/2016   Procedure: Right Heart Cath;  Surgeon: Laurey Morale, MD;  Location: [facility] INVASIVE CV LAB;  Service: Cardiovascular;  Laterality: N/A;   TEE WITHOUT CARDIOVERSION N/A 01/07/2021   Procedure: TRANSESOPHAGEAL ECHOCARDIOGRAM (TEE);  Surgeon: Elease Hashimoto Deloris Ping, MD;  Location: [facility] ENDOSCOPY;  Service: Cardiovascular;  Laterality: N/A;    Allergies  Allergies  Allergen Reactions   Combigan [Brimonidine Tartrate-Timolol] Itching    Eyes itch, reddened   Other Other (See Comments)    Per patient made OU red, Sore, and sensitivity to light   Sulfa Antibiotics Rash   Sulfamethoxazole-Trimethoprim Hives    Home Medications    Prior to Admission medications   Medication Sig Start Date End Date Taking? Authorizing Provider  albuterol (PROVENTIL HFA;VENTOLIN HFA) 108 (90 Base) MCG/ACT inhaler Inhale 2 puffs into the lungs every 6 (six) hours as needed for wheezing or shortness of breath.    [provider]  apixaban (ELIQUIS) 5 MG TABS tablet TAKE 1 TABLET TWICE A DAY 07/17/21   Bhagat, Yale, PA  Apoaequorin (PREVAGEN) 10 MG CAPS Take 1 capsule by mouth every morning. 11/28/20   Carlean Jews, NP  ARMOUR THYROID 15 MG tablet TAKE 1 TABLET DAILY ALONG WITH 60 MG 12/16/21   Abonza, Kirklin, PA-C  ARMOUR THYROID 60 MG tablet TAKE 1 TABLET DAILY  BEFORE BREAKFAST 01/13/22   Abonza, Maritza, PA-C  atorvastatin (LIPITOR) 40 MG tablet Take 1 tablet (40 mg total) by mouth daily before supper. 11/25/21 11/26/22  Quintella Reichert, MD  bimatoprost (LUMIGAN) 0.01 % SOLN Place 1 drop into both eyes 2 (two) times daily.    [provider]  calcium-vitamin D (OSCAL WITH D) 500-200 MG-UNIT tablet Take 1 tablet by mouth daily with breakfast. 11/03/17   Opalski, Gavin Pound, DO  dexamethasone (DECADRON) 4 MG tablet Take 2 tablets (8 mg) by mouth daily for 3 days starting the day after chemotherapy. Take with food. 07/21/22   Josph Macho, MD  diltiazem (CARDIZEM CD) 300 MG 24 hr capsule Take 1 capsule (300 mg total) by mouth daily. 03/16/22   Quintella Reichert, MD  dorzolamide-timolol (COSOPT) 22.3-6.8 MG/ML ophthalmic solution Place 1 drop into both eyes at bedtime.    [provider]  fluticasone (FLONASE) 50 MCG/ACT nasal spray 1 spray each nostril following sinus rinses twice daily 02/08/18  Thomasene Lot, DO  fluticasone-salmeterol (ADVAIR HFA) [phone] MCG/ACT inhaler USE 2 INHALATIONS TWICE A DAY 06/25/22   Saralyn Pilar A, PA  furosemide (LASIX) 40 MG tablet TAKE 1 TABLET TWICE A DAY 05/25/22   Quintella Reichert, MD  gabapentin (NEURONTIN) 400 MG capsule TAKE 1 CAPSULE TWICE A DAY 11/25/21   Abonza, Maritza, PA-C  L-Methylfolate-B12-B6-B2 (CEREFOLIN) 07-31-48-5 MG TABS Take 1 tablet by mouth every morning. 02/10/22   Ihor Austin, NP  levETIRAcetam (KEPPRA XR) 500 MG 24 hr tablet Take 1 tablet (500 mg total) by mouth at bedtime. 08/07/21   Ihor Austin, NP  loratadine (CLARITIN) 10 MG tablet Take 10 mg by mouth daily.    [provider]  memantine (NAMENDA) 10 MG tablet Take 1 tablet (10 mg total) by mouth 2 (two) times daily. 08/07/21   Ihor Austin, NP  metoprolol succinate (TOPROL-XL) 50 MG 24 hr tablet Take 1.5 tablets (75 mg total) by mouth daily. Take with or immediately following a meal. 09/05/21   Turner, Cornelious Bryant, MD   Multiple Vitamins-Iron (MULTIVITAMIN/IRON) TABS Take 1 tablet by mouth daily.    [provider]  Netarsudil Dimesylate (RHOPRESSA) 0.02 % SOLN Place 1 drop into both eyes at bedtime.    [provider]  ondansetron (ZOFRAN) 8 MG tablet Take 1 tablet (8 mg total) by mouth every 8 (eight) hours as needed for nausea or vomiting. Start on the third day after chemotherapy. 07/21/22   Josph Macho, MD  pantoprazole (PROTONIX) 40 MG tablet TAKE 1 TABLET DAILY 02/26/22   Carlean Jews, NP  potassium chloride SA (KLOR-CON M) 20 MEQ tablet TAKE 1 TABLET TWICE A DAY 08/06/21   Quintella Reichert, MD  prochlorperazine (COMPAZINE) 10 MG tablet Take 1 tablet (10 mg total) by mouth every 6 (six) hours as needed for nausea or vomiting. 07/21/22   Josph Macho, MD  traMADol (ULTRAM) 50 MG tablet Take 1 tablet (50 mg total) by mouth every 6 (six) hours as needed. 06/01/22   Josph Macho, MD  Vitamin D, Ergocalciferol, (DRISDOL) 1.25 MG (50000 UNIT) CAPS capsule TAKE 1 CAPSULE WEEKLY 10/28/21   Carlean Jews, NP    Physical Exam    Vital Signs:  Patricia [name] does not have vital signs available for review today.  Given telephonic nature of communication, physical exam is limited. AAOx3. NAD. Normal affect.  Speech and respirations are unlabored.  Accessory Clinical Findings    None  Assessment & Plan    1.  Preoperative Cardiovascular Risk Assessment: -Patient's RCRI score is 11%  The patient affirms she has been doing well without any new cardiac symptoms. They are able to achieve 4 METS without cardiac limitations. Therefore, based on ACC/AHA guidelines, the patient would be at acceptable risk for the planned procedure without further cardiovascular testing. The patient was advised that if she develops new symptoms prior to surgery to contact our office to arrange for a follow-up visit, and she verbalized understanding.   The patient was advised that if she develops new  symptoms prior to surgery to contact our office to arrange for a follow-up visit, and she verbalized understanding.  Patient advised to hold Eliquis 3 days prior to procedure and resume when safe as possible postprocedure.  A copy of this note will be routed to requesting surgeon.  Time:   Today, I have spent 7 minutes with the patient with telehealth technology discussing medical history, symptoms, and management plan.  Napoleon Form, Leodis Rains, NP  07/22/2022, 2:51 PM

## 2022-07-22 NOTE — Patient Instructions (Signed)
[facility]  Discharge Instructions: Thank you for choosing [facility] to provide your oncology and hematology care.   If you have a lab appointment with the Cancer Center, please go directly to the Cancer Center and check in at the registration area.  Wear comfortable clothing and clothing appropriate for easy access to any Portacath or PICC line.   We strive to give you quality time with your provider. You may need to reschedule your appointment if you arrive late (15 or more minutes).  Arriving late affects you and other patients whose appointments are after yours.  Also, if you miss three or more appointments without notifying the office, you may be dismissed from the clinic at the provider's discretion.      For prescription refill requests, have your pharmacy contact our office and allow 72 hours for refills to be completed.    Today you received the following chemotherapy and/or immunotherapy agents Enhertu      To help prevent nausea and vomiting after your treatment, we encourage you to take your nausea medication as directed.  BELOW ARE SYMPTOMS THAT SHOULD BE REPORTED IMMEDIATELY: *FEVER GREATER THAN 100.4 F (38 C) OR HIGHER *CHILLS OR SWEATING *NAUSEA AND VOMITING THAT IS NOT CONTROLLED WITH YOUR NAUSEA MEDICATION *UNUSUAL SHORTNESS OF BREATH *UNUSUAL BRUISING OR BLEEDING *URINARY PROBLEMS (pain or burning when urinating, or frequent urination) *BOWEL PROBLEMS (unusual diarrhea, constipation, pain near the anus) TENDERNESS IN MOUTH AND THROAT WITH OR WITHOUT PRESENCE OF ULCERS (sore throat, sores in mouth, or a toothache) UNUSUAL RASH, SWELLING OR PAIN  UNUSUAL VAGINAL DISCHARGE OR ITCHING   Items with * indicate a potential emergency and should be followed up as soon as possible or go to the Emergency Department if any problems should occur.  Please show the CHEMOTHERAPY ALERT CARD or IMMUNOTHERAPY ALERT CARD at check-in  to the Emergency Department and triage nurse. Should you have questions after your visit or need to cancel or reschedule your appointment, please contact [facility]  [phone] and follow the prompts.  Office hours are 8:00 a.m. to 4:30 p.m. Monday - Friday. Please note that voicemails left after 4:00 p.m. may not be returned until the following business day.  We are closed weekends and major holidays. You have access to a nurse at all times for urgent questions. Please call the main number to the clinic [phone] and follow the prompts.  For any non-urgent questions, you may also contact your provider using MyChart. We now offer e-Visits for anyone 18 and older to request care online for non-urgent symptoms. For details visit [email or web address].   Also download the MyChart app! Go to the app store, search "MyChart", open the app, select [facility], and log in with your MyChart username and password.   

## 2022-07-22 NOTE — Progress Notes (Signed)
Patient came Monday to start new treatment but hadn't yet had her ECHO. She received chemo ed on Monday, but returned today, after her ECHO to start treatment.   She is scheduled for her pinning on 07/28/2022 with Dr Jerl Santos.   Oncology Nurse Navigator Documentation     07/22/2022   12:30 PM  Oncology Nurse Navigator Flowsheets  Planned Course of Treatment Chemotherapy  Phase of Treatment Chemo  Chemotherapy Actual Start Date: 07/22/2022  Navigator Follow Up Date: 08/10/2022  Navigator Follow Up Reason: Follow-up Appointment;Chemotherapy  Navigator Location [facility]  Navigator Encounter Type Treatment;Appt/Treatment Plan Review  Patient Visit Type MedOnc  Treatment Phase Active Tx  Barriers/Navigation Needs Coordination of Care  Interventions Psycho-Social Support  Acuity Level 1-No Barriers  Support Groups/Services Friends and Family  Time Spent with Patient 15

## 2022-07-23 ENCOUNTER — Ambulatory Visit: Payer: Medicare Other | Attending: Interventional Cardiology

## 2022-07-23 DIAGNOSIS — Z01810 Encounter for preprocedural cardiovascular examination: Secondary | ICD-10-CM | POA: Diagnosis not present

## 2022-07-23 NOTE — Progress Notes (Addendum)
COVID Vaccine received:  []  No [x]  Yes Date of any COVID positive Test in last 90 days: None  PCP - Saralyn Pilar, PA  Cardiologist - Armanda Magic, MD,  Robin Searing NP EP-  Loman Brooklyn, MD  Neurology-    Pearlean Brownie, MD,  Ihor Austin, NP Oncology- Arlan Organ, MD  Chest x-ray -  EKG - 02-05-2022  Epic  Stress Test -  ECHO - 07-21-2022  Epic Cardiac Cath - 04-27-2016  RHC by Dr. Shirlee Latch  PCR screen: []  Ordered & Completed           []   No Order but Needs PROFEND           [x]   N/A for this surgery  Surgery Plan:  []  Ambulatory                            []  Outpatient in bed                            [x]  Admit  Anesthesia:    []  General  []  Spinal                           [x]   Choice []   MAC  Pacemaker / ICD device [x]  No []  Yes   Spinal Cord Stimulator:[x]  No []  Yes       History of Sleep Apnea? []  No [x]  Yes   CPAP used?- []  No [x]  Yes    Does the patient monitor blood sugar?          [x]  No []  Yes  []  N/A  Patient has: []  NO Hx DM   [x]  Pre-DM   No Meds              []  DM1  []   DM2 Does patient have a [employer] or Dexacom? [x]  No []  Yes   Fasting Blood Sugar Ranges-  Checks Blood Sugar _0_ times a day  Blood Thinner / Instructions:  Eliquis,  Hold x 3 days, per Robin Searing, NP 07-23-22 note Aspirin Instructions:  None  ERAS Protocol Ordered: []  No  [x]  Yes PRE-SURGERY []  ENSURE  [x]  G2  Patient is to be NPO after: 11;30 sm  Comments: Mrs. Tawney has dementia and memory loss; she takes Namenda. Patient was lucid and appropriate with her answers to all my PST questions. Her husband was present and did not correct any of her answers. She has  been taking all  her medications as directed with no lapses. Per her Husband, she is capable of signing her Surgery Consent today.  Activity level: Patient is unable to climb a flight of stairs without difficulty;  Patient can not perform ADLs without assistance.   Anesthesia review: Current Chemo Tx - Left breast Ca w/ Mets,  OSA-BiPAP, HTN, dementia on Namenda, Chronic A.Fib (2 cardioiversions), CHF- Pulmonary HTN, CKD3, Glaucoma, Asthma- Chronic Cough, Pre-DM, Hx CVA/ seizure,   Patient denies shortness of breath, fever, cough and chest pain at PAT appointment.  Patient verbalized understanding and agreement to the Pre-Surgical Instructions that were given to them at this PAT appointment. Patient was also educated of the need to review these PAT instructions again prior to her surgery.I reviewed the appropriate phone numbers to call if they have any and questions or concerns.

## 2022-07-23 NOTE — Patient Instructions (Addendum)
SURGICAL WAITING ROOM VISITATION Patients having surgery or a procedure may have no more than 2 support people in the waiting area - these visitors may rotate in the visitor waiting room.   Due to an increase in RSV and influenza rates and associated hospitalizations, children ages 80 and under may not visit patients in [facility] hospitals. If the patient needs to stay at the hospital during part of their recovery, the visitor guidelines for inpatient rooms apply.  PRE-OP VISITATION  Pre-op nurse will coordinate an appropriate time for 1 support person to accompany the patient in pre-op.  This support person may not rotate.  This visitor will be contacted when the time is appropriate for the visitor to come back in the pre-op area.  Please refer to the [facility] website for the visitor guidelines for Inpatients (after your surgery is over and you are in a regular room).  You are not required to quarantine at this time prior to your surgery. However, you must do this: Hand Hygiene often Do NOT share personal items Notify your provider if you are in close contact with someone who has COVID or you develop fever 100.4 or greater, new onset of sneezing, cough, sore throat, shortness of breath or body aches.  If you test positive for Covid or have been in contact with anyone that has tested positive in the last 10 days please notify you surgeon.    Your procedure is scheduled on:  Tuesday   Jul 28, 2022  Report to [facility] Main Entrance: [name] entrance where the [employer] is available.   Report to admitting at:  12:00   Noon  +++++Call this number if you have any questions or problems the morning of surgery [phone]  Do not eat food after Midnight the night prior to your surgery/procedure.  After Midnight you may have the following liquids until  11:30  AM  DAY OF SURGERY  Clear Liquid Diet Water Black Coffee (sugar ok, NO MILK/CREAM OR CREAMERS)  Tea (sugar ok,  NO MILK/CREAM OR CREAMERS) regular and decaf                             Plain Jell-O  with no fruit (NO RED)                                           Fruit ices (not with fruit pulp, NO RED)                                     Popsicles (NO RED)                                                                  Juice: apple, WHITE grape, WHITE cranberry Sports drinks like Gatorade or Powerade (NO RED)                     The day of surgery:  Drink ONE (1) Pre-Surgery G2 at   11:30 AM the morning of surgery.  Drink in one sitting. Do not sip.  This drink was given to you during your hospital pre-op appointment visit. Nothing else to drink after completing the Pre-Surgery G2 : No candy, chewing gum or throat lozenges.    FOLLOW  ANY ADDITIONAL PRE OP INSTRUCTIONS YOU RECEIVED FROM YOUR SURGEON'S OFFICE!!!   Oral Hygiene is also important to reduce your risk of infection.        Remember - BRUSH YOUR TEETH THE MORNING OF SURGERY WITH YOUR REGULAR TOOTHPASTE  Do NOT smoke after Midnight the night before surgery.  Take ONLY these medicines the morning of surgery with A SIP OF WATER: Pantoprazole (Protonix), Armour Thyroid, Memantine ( Namenda), Diltiazem (Cardizem),  Loratidine (Claritin), Gabapentin, and Tramadol if needed for pain.  You may use your Inhalers and Eye Drops.    If You have been diagnosed with Sleep Apnea - Bring CPAP mask and tubing day of surgery. We will provide you with a CPAP machine on the day of your surgery.                   You may not have any metal on your body including hair pins, jewelry, and body piercing  Do not wear make-up, lotions, powders, perfumes or deodorant  Do not wear nail polish including gel and S&S, artificial / acrylic nails, or any other type of covering on natural nails including finger and toenails. If you have artificial nails, gel coating, etc., that needs to be removed by a nail salon, Please have this removed prior to surgery. Not doing  so may mean that your surgery could be cancelled or delayed if the Surgeon or anesthesia staff feels like they are unable to monitor you safely.   Do not shave 48 hours prior to surgery to avoid nicks in your skin which may contribute to postoperative infections.   You may bring a small overnight bag with you on the day of surgery, only pack items that are not valuable. [name] IS NOT RESPONSIBLE   FOR VALUABLES THAT ARE LOST OR STOLEN.   Do not bring your home medications to the hospital  EXCEPT FOR YOUR ALBUTEROL INHALER. The Pharmacy will dispense medications listed on your medication list to you during your admission in the Hospital.  Special Instructions: Bring a copy of your healthcare power of attorney and living will documents the day of surgery, if you wish to have them scanned into your [facility] Records- EPIC  Please read over the following fact sheets you were given: IF YOU HAVE QUESTIONS ABOUT YOUR PRE-OP INSTRUCTIONS, PLEASE CALL [phone].   [facility] - Preparing for Surgery Before surgery, you can play an important role.  Because skin is not sterile, your skin needs to be as free of germs as possible.  You can reduce the number of germs on your skin by washing with CHG (chlorahexidine gluconate) soap before surgery.  CHG is an antiseptic cleaner which kills germs and bonds with the skin to continue killing germs even after washing. Please DO NOT use if you have an allergy to CHG or antibacterial soaps.  If your skin becomes reddened/irritated stop using the CHG and inform your nurse when you arrive at Short Stay. Do not shave (including legs and underarms) for at least 48 hours prior to the first CHG shower.  You may shave your face/neck.  Please follow these instructions carefully:  1.  Shower with CHG Soap the night before surgery and the  morning of surgery.  2.  If you choose to wash your hair, wash your hair first as usual with your normal  shampoo.  3.   After you shampoo, rinse your hair and body thoroughly to remove the shampoo.                             4.  Use CHG as you would any other liquid soap.  You can apply chg directly to the skin and wash.  Gently with a scrungie or clean washcloth.  5.  Apply the CHG Soap to your body ONLY FROM THE NECK DOWN.   Do not use on face/ open                           Wound or open sores. Avoid contact with eyes, ears mouth and genitals (private parts).                       Wash face,  Genitals (private parts) with your normal soap.             6.  Wash thoroughly, paying special attention to the area where your  surgery  will be performed.  7.  Thoroughly rinse your body with warm water from the neck down.  8.  DO NOT shower/wash with your normal soap after using and rinsing off the CHG Soap.            9.  Pat yourself dry with a clean towel.            10.  Wear clean pajamas.            11.  Place clean sheets on your bed the night of your first shower and do not  sleep with pets.  ON THE DAY OF SURGERY : Do not apply any lotions/deodorants the morning of surgery.  Please wear clean clothes to the hospital/surgery center.    FAILURE TO FOLLOW THESE INSTRUCTIONS MAY RESULT IN THE CANCELLATION OF YOUR SURGERY  PATIENT SIGNATURE_________________________________  NURSE SIGNATURE__________________________________  ________________________________________________________________________         Patricia [name]    An incentive spirometer is a tool that can help keep your lungs clear and active. This tool measures how well you are filling your lungs with each breath. Taking long deep breaths may help reverse or decrease the chance of developing breathing (pulmonary) problems (especially infection) following: A long period of time when you are unable to move or be active. BEFORE THE PROCEDURE  If the spirometer includes an indicator to show your best effort, your nurse or respiratory  therapist will set it to a desired goal. If possible, sit up straight or lean slightly forward. Try not to slouch. Hold the incentive spirometer in an upright position. INSTRUCTIONS FOR USE  Sit on the edge of your bed if possible, or sit up as far as you can in bed or on a chair. Hold the incentive spirometer in an upright position. Breathe out normally. Place the mouthpiece in your mouth and seal your lips tightly around it. Breathe in slowly and as deeply as possible, raising the piston or the ball toward the top of the column. Hold your breath for 3-5 seconds or for as long as possible. Allow the piston or ball to fall to the bottom of the column. Remove the mouthpiece from your mouth and breathe out normally. Rest for  a few seconds and repeat Steps 1 through 7 at least 10 times every 1-2 hours when you are awake. Take your time and take a few normal breaths between deep breaths. The spirometer may include an indicator to show your best effort. Use the indicator as a goal to work toward during each repetition. After each set of 10 deep breaths, practice coughing to be sure your lungs are clear. If you have an incision (the cut made at the time of surgery), support your incision when coughing by placing a pillow or rolled up towels firmly against it. Once you are able to get out of bed, walk around indoors and cough well. You may stop using the incentive spirometer when instructed by your caregiver.  RISKS AND COMPLICATIONS Take your time so you do not get dizzy or light-headed. If you are in pain, you may need to take or ask for pain medication before doing incentive spirometry. It is harder to take a deep breath if you are having pain. AFTER USE Rest and breathe slowly and easily. It can be helpful to keep track of a log of your progress. Your caregiver can provide you with a simple table to help with this. If you are using the spirometer at home, follow these instructions: SEEK MEDICAL  CARE IF:  You are having difficultly using the spirometer. You have trouble using the spirometer as often as instructed. Your pain medication is not giving enough relief while using the spirometer. You develop fever of 100.5 F (38.1 C) or higher.                                                                                                    SEEK IMMEDIATE MEDICAL CARE IF:  You cough up bloody sputum that had not been present before. You develop fever of 102 F (38.9 C) or greater. You develop worsening pain at or near the incision site. MAKE SURE YOU:  Understand these instructions. Will watch your condition. Will get help right away if you are not doing well or get worse. Document Released: 06/29/2006 Document Revised: 05/11/2011 Document Reviewed: 08/30/2006 [facility] Patient Information 2014 [place], [place].

## 2022-07-24 ENCOUNTER — Other Ambulatory Visit: Payer: Self-pay

## 2022-07-24 ENCOUNTER — Encounter (HOSPITAL_COMMUNITY)
Admission: RE | Admit: 2022-07-24 | Discharge: 2022-07-24 | Disposition: A | Discharge status: Home / Self Care | Payer: Medicare Other | Source: Ambulatory Visit | Attending: Orthopaedic Surgery | Admitting: Orthopaedic Surgery

## 2022-07-24 ENCOUNTER — Encounter (HOSPITAL_COMMUNITY): Payer: Self-pay

## 2022-07-24 VITALS — BP 96/60 | HR 76 | Temp 98.0°F | Resp 20 | Ht 60.0 in | Wt 145.0 lb

## 2022-07-24 DIAGNOSIS — C7951 Secondary malignant neoplasm of bone: Secondary | ICD-10-CM | POA: Diagnosis not present

## 2022-07-24 DIAGNOSIS — Z01812 Encounter for preprocedural laboratory examination: Secondary | ICD-10-CM | POA: Diagnosis not present

## 2022-07-24 DIAGNOSIS — R7303 Prediabetes: Secondary | ICD-10-CM | POA: Insufficient documentation

## 2022-07-24 DIAGNOSIS — I2510 Atherosclerotic heart disease of native coronary artery without angina pectoris: Secondary | ICD-10-CM | POA: Insufficient documentation

## 2022-07-24 HISTORY — DX: Cerebral infarction, unspecified: I63.9

## 2022-07-24 HISTORY — DX: Hypothyroidism, unspecified: E03.9

## 2022-07-24 HISTORY — DX: Unspecified dementia, unspecified severity, without behavioral disturbance, psychotic disturbance, mood disturbance, and anxiety: F03.90

## 2022-07-24 LAB — COMPREHENSIVE METABOLIC PANEL
ALT: 51 U/L — ABNORMAL HIGH (ref 0–44)
AST: 45 U/L — ABNORMAL HIGH (ref 15–41)
Albumin: 3.7 g/dL (ref 3.5–5.0)
Alkaline Phosphatase: 61 U/L (ref 38–126)
Anion gap: 10 (ref 5–15)
BUN: 49 mg/dL — ABNORMAL HIGH (ref 8–23)
CO2: 22 mmol/L (ref 22–32)
Calcium: 8 mg/dL — ABNORMAL LOW (ref 8.9–10.3)
Chloride: 106 mmol/L (ref 98–111)
Creatinine, Ser: 1.47 mg/dL — ABNORMAL HIGH (ref 0.44–1.00)
GFR, Estimated: 36 mL/min — ABNORMAL LOW (ref >60)
Glucose, Bld: 142 mg/dL — ABNORMAL HIGH (ref 70–99)
Potassium: 4.6 mmol/L (ref 3.5–5.1)
Sodium: 138 mmol/L (ref 135–145)
Total Bilirubin: 0.7 mg/dL (ref 0.3–1.2)
Total Protein: 7.2 g/dL (ref 6.5–8.1)

## 2022-07-24 LAB — CBC
HCT: 38.8 % (ref 36.0–46.0)
Hemoglobin: 12.8 g/dL (ref 12.0–15.0)
MCH: 32.6 pg (ref 26.0–34.0)
MCHC: 33 g/dL (ref 30.0–36.0)
MCV: 98.7 fL (ref 80.0–100.0)
Platelets: 203 10*3/uL (ref 150–400)
RBC: 3.93 MIL/uL (ref 3.87–5.11)
RDW: 13.3 % (ref 11.5–15.5)
WBC: 11.7 10*3/uL — ABNORMAL HIGH (ref 4.0–10.5)
nRBC: 0 % (ref 0.0–0.2)

## 2022-07-24 LAB — GLUCOSE, CAPILLARY: Glucose-Capillary: 136 mg/dL — ABNORMAL HIGH (ref 70–99)

## 2022-07-25 LAB — HEMOGLOBIN A1C
Hgb A1c MFr Bld: 6.3 % — ABNORMAL HIGH (ref 4.8–5.6)
Mean Plasma Glucose: 134 mg/dL

## 2022-07-26 NOTE — H&P (Signed)
Patricia [name] is an 82 y.o. female.   Chief Complaint: left femur pain HPI: Patricia [name] is in with [facility].  Dr. Myna Hidalgo put her through PET scan and plain films and she has a lesion on the left femur which needs to be stabilized.  She is using a cane for balance.    X-rays: I reviewed some films on the [facility] system dated 07/08/22.  She does have a lesion on the anterior aspect of the left femur midshaft which has eroded more than 50% of the cortex.  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 Dx 04/2015 at time of stroke. b. DCCV 06/2015 - did not hold. Amio started then stopped due to QT prolongation.   Chronic cough    Chronic diastolic heart failure (HCC) 06/17/2015   CKD (chronic kidney disease), stage III (HCC)    Cough variant asthma    Dementia (HCC)    Disease of pharynx or nasopharynx    Diverticulosis    Enlargement of right atrium 01/23/2020   Essential hypertension    GERD (gastroesophageal reflux disease)    Glaucoma    Hiatal hernia    Hyperlipidemia    Hypothyroidism    Internal hemorrhoids    Laryngospasm    Mitral regurgitation    mild to moderate by echo 12/2019 and moderate by TEE 12/2020   Multinodular goiter    Osteopenia    Postmenopausal    Pulmonary HTN (HCC)     Moderate with PASP by echo 2018 likely Group 2 from pulmonary venous HTN from CHF>>normal PAP by echo 12/2019   Radiation 10/22/14-11/23/14   Left Breast   Stricture and stenosis of cervix    Stroke (HCC)    TIAs    Past Surgical History:  Procedure Laterality Date   BREAST LUMPECTOMY Left    BREAST SURGERY     CARDIOVERSION N/A 06/24/2015   Procedure: CARDIOVERSION;  Surgeon: Quintella Reichert, MD;  Location: [facility];  Service: Cardiovascular;  Laterality: N/A;    CARDIOVERSION N/A 05/19/2016   Procedure: CARDIOVERSION;  Surgeon: Quintella Reichert, MD;  Location: [facility];  Service: Cardiovascular;  Laterality: N/A;   COLONOSCOPY     EYE SURGERY Bilateral    cataract removal by Dr. Burgess Estelle   LUNG CANCER SURGERY  2003   resection carcinoid lingula-lt upper lobe   RADIOACTIVE SEED GUIDED PARTIAL MASTECTOMY WITH AXILLARY SENTINEL LYMPH NODE BIOPSY Left 06/26/2014   Procedure: RADIOACTIVE SEED GUIDED PARTIAL MASTECTOMY WITH AXILLARY SENTINEL LYMPH NODE BIOPSY;  Surgeon: Almond Lint, MD;  Location: [facility];  Service: General;  Laterality: Left;   RE-EXCISION OF BREAST LUMPECTOMY Left 08/07/2014   Procedure: RE-EXCISION OF LEFT BREAST LUMPECTOMY;  Surgeon: Almond Lint, MD;  Location: [facility];  Service: General;  Laterality: Left;   RIGHT HEART CATH N/A 04/27/2016   Procedure: Right Heart Cath;  Surgeon: Laurey Morale, MD;  Location: [facility] INVASIVE CV LAB;  Service: Cardiovascular;  Laterality: N/A;   TEE WITHOUT CARDIOVERSION N/A 01/07/2021   Procedure: TRANSESOPHAGEAL ECHOCARDIOGRAM (TEE);  Surgeon: Elease Hashimoto Deloris Ping, MD;  Location: [facility] ENDOSCOPY;  Service: Cardiovascular;  Laterality: N/A;    Family History  Problem Relation Age of  Onset   Stroke Mother    Hypertension Mother    Hyperlipidemia Mother    Stroke Father    Transient ischemic attack Father    Hyperlipidemia Father    Hypertension Father    Atrial fibrillation Son    Heart attack Neg Hx    Social History:  reports that she quit smoking about 56 years ago. Her smoking use included cigarettes. She has a 3.75 pack-year smoking history. She has been exposed to tobacco smoke. She has never used smokeless tobacco. She reports that she does not currently use alcohol. She reports that she does not use drugs.  Allergies:  Allergies  Allergen Reactions   Combigan [Brimonidine Tartrate-Timolol] Itching    Per patient made eyes red, sore, and sensitivity to  light   Sulfa Antibiotics Hives and Rash   Sulfamethoxazole-Trimethoprim Hives    No medications prior to admission.    Results for orders placed or performed during the hospital encounter of 07/24/22 (from the past 48 hour(s))  Glucose, capillary     Status: Abnormal   Collection Time: 07/24/22  1:37 PM  Result Value Ref Range   Glucose-Capillary 136 (H) 70 - 99 mg/dL    Comment: Glucose reference range applies only to samples taken after fasting for at least 8 hours.  Hemoglobin A1c per protocol     Status: Abnormal   Collection Time: 07/24/22  1:52 PM  Result Value Ref Range   Hgb A1c MFr Bld 6.3 (H) 4.8 - 5.6 %    Comment: (NOTE)         Prediabetes: 5.7 - 6.4         Diabetes: >6.4         Glycemic control for adults with diabetes: <7.0    Mean Plasma Glucose 134 mg/dL    Comment: (NOTE) Performed At: [facility] [address] [place], [place] [id number] Jolene Schimke MD WU:[id number]   Comprehensive metabolic panel per protocol     Status: Abnormal   Collection Time: 07/24/22  1:52 PM  Result Value Ref Range   Sodium 138 135 - 145 mmol/L   Potassium 4.6 3.5 - 5.1 mmol/L   Chloride 106 98 - 111 mmol/L   CO2 22 22 - 32 mmol/L   Glucose, Bld 142 (H) 70 - 99 mg/dL    Comment: Glucose reference range applies only to samples taken after fasting for at least 8 hours.   BUN 49 (H) 8 - 23 mg/dL   Creatinine, Ser 9.56 (H) 0.44 - 1.00 mg/dL   Calcium 8.0 (L) 8.9 - 10.3 mg/dL   Total Protein 7.2 6.5 - 8.1 g/dL   Albumin 3.7 3.5 - 5.0 g/dL   AST 45 (H) 15 - 41 U/L   ALT 51 (H) 0 - 44 U/L   Alkaline Phosphatase 61 38 - 126 U/L   Total Bilirubin 0.7 0.3 - 1.2 mg/dL   GFR, Estimated 36 (L) >60 mL/min    Comment: (NOTE) Calculated using the CKD-EPI Creatinine Equation (2021)    Anion gap 10 5 - 15    Comment: Performed at [facility], [address] [place], [place] [address]  CBC per protocol     Status: Abnormal   Collection Time: 07/24/22   1:52 PM  Result Value Ref Range   WBC 11.7 (H) 4.0 - 10.5 K/uL   RBC 3.93 3.87 - 5.11 MIL/uL   Hemoglobin 12.8 12.0 - 15.0 g/dL   HCT 65.7 84.6 - 96.2 %  MCV 98.7 80.0 - 100.0 fL   MCH 32.6 26.0 - 34.0 pg   MCHC 33.0 30.0 - 36.0 g/dL   RDW 16.1 09.6 - 04.5 %   Platelets 203 150 - 400 K/uL   nRBC 0.0 0.0 - 0.2 %    Comment: Performed at [facility], [address] [place], [place] [address]   *Note: Due to a large number of results and/or encounters for the requested time period, some results have not been displayed. A complete set of results can be found in Results Review.   No results found.  Review of Systems  Musculoskeletal:  Positive for arthralgias.       Left femur  All other systems reviewed and are negative.   There were no vitals taken for this visit. Physical Exam Constitutional:      Appearance: Normal appearance.  HENT:     Head: Normocephalic and atraumatic.     Nose: Nose normal.     Mouth/Throat:     Pharynx: Oropharynx is clear.  Eyes:     Extraocular Movements: Extraocular movements intact.  Pulmonary:     Effort: Pulmonary effort is normal.  Abdominal:     Palpations: Abdomen is soft.  Musculoskeletal:     Cervical back: Normal range of motion.     Comments: Left hip motion is good.  Straight leg raise is negative.  Sensation is intact distally and she has palpable pulses.  The skin is benign across her leg.   Skin:    General: Skin is warm and dry.  Neurological:     General: No focal deficit present.     Mental Status: She is alert and oriented to person, place, and time.  Psychiatric:        Mood and Affect: Mood normal.        Behavior: Behavior normal.        Thought Content: Thought content normal.        Judgment: Judgment normal.      Assessment/Plan Assessment:   1.  Left femur metastasis 2.  Eliquis for atrial fibrillation  Plan: We are going to set [place] up for stabilization of this metastatic lesion  in her left femur.  I reviewed risk of anesthesia, infection, DVT as well as fracture.  Hopefully he can tackle this on an outpatient basis.    Ginger Organ Diante Barley, PA-C 07/26/2022, 8:50 AM

## 2022-07-28 ENCOUNTER — Inpatient Hospital Stay (HOSPITAL_COMMUNITY)
Admission: RE | Admit: 2022-07-28 | Discharge: 2022-07-29 | DRG: 481 | Disposition: A | Discharge status: Home / Self Care | Payer: Medicare Other | Attending: Orthopaedic Surgery | Admitting: Orthopaedic Surgery

## 2022-07-28 ENCOUNTER — Inpatient Hospital Stay (HOSPITAL_COMMUNITY): Payer: Medicare Other | Admitting: Anesthesiology

## 2022-07-28 ENCOUNTER — Inpatient Hospital Stay (HOSPITAL_COMMUNITY): Payer: Medicare Other

## 2022-07-28 ENCOUNTER — Encounter (HOSPITAL_COMMUNITY)
Admission: RE | Disposition: A | Discharge status: Home / Self Care | Payer: Self-pay | Source: Home / Self Care | Attending: Orthopaedic Surgery

## 2022-07-28 ENCOUNTER — Encounter (HOSPITAL_COMMUNITY): Payer: Self-pay | Admitting: Orthopaedic Surgery

## 2022-07-28 ENCOUNTER — Other Ambulatory Visit: Payer: Self-pay

## 2022-07-28 DIAGNOSIS — N183 Chronic kidney disease, stage 3 unspecified: Secondary | ICD-10-CM | POA: Diagnosis present

## 2022-07-28 DIAGNOSIS — E039 Hypothyroidism, unspecified: Secondary | ICD-10-CM | POA: Diagnosis present

## 2022-07-28 DIAGNOSIS — Z9011 Acquired absence of right breast and nipple: Secondary | ICD-10-CM

## 2022-07-28 DIAGNOSIS — Z888 Allergy status to other drugs, medicaments and biological substances status: Secondary | ICD-10-CM

## 2022-07-28 DIAGNOSIS — I130 Hypertensive heart and chronic kidney disease with heart failure and stage 1 through stage 4 chronic kidney disease, or unspecified chronic kidney disease: Secondary | ICD-10-CM | POA: Diagnosis present

## 2022-07-28 DIAGNOSIS — H409 Unspecified glaucoma: Secondary | ICD-10-CM | POA: Diagnosis present

## 2022-07-28 DIAGNOSIS — M8580 Other specified disorders of bone density and structure, unspecified site: Secondary | ICD-10-CM | POA: Diagnosis present

## 2022-07-28 DIAGNOSIS — Z8249 Family history of ischemic heart disease and other diseases of the circulatory system: Secondary | ICD-10-CM

## 2022-07-28 DIAGNOSIS — Z87891 Personal history of nicotine dependence: Secondary | ICD-10-CM

## 2022-07-28 DIAGNOSIS — Z882 Allergy status to sulfonamides status: Secondary | ICD-10-CM

## 2022-07-28 DIAGNOSIS — I482 Chronic atrial fibrillation, unspecified: Secondary | ICD-10-CM | POA: Diagnosis present

## 2022-07-28 DIAGNOSIS — I272 Pulmonary hypertension, unspecified: Secondary | ICD-10-CM | POA: Diagnosis present

## 2022-07-28 DIAGNOSIS — C7951 Secondary malignant neoplasm of bone: Secondary | ICD-10-CM | POA: Diagnosis not present

## 2022-07-28 DIAGNOSIS — Z7901 Long term (current) use of anticoagulants: Secondary | ICD-10-CM | POA: Diagnosis not present

## 2022-07-28 DIAGNOSIS — Z981 Arthrodesis status: Secondary | ICD-10-CM | POA: Diagnosis not present

## 2022-07-28 DIAGNOSIS — I080 Rheumatic disorders of both mitral and aortic valves: Secondary | ICD-10-CM | POA: Diagnosis present

## 2022-07-28 DIAGNOSIS — Z171 Estrogen receptor negative status [ER-]: Secondary | ICD-10-CM | POA: Diagnosis not present

## 2022-07-28 DIAGNOSIS — F0390 Unspecified dementia without behavioral disturbance: Secondary | ICD-10-CM | POA: Diagnosis present

## 2022-07-28 DIAGNOSIS — M79605 Pain in left leg: Secondary | ICD-10-CM | POA: Diagnosis not present

## 2022-07-28 DIAGNOSIS — Z83438 Family history of other disorder of lipoprotein metabolism and other lipidemia: Secondary | ICD-10-CM | POA: Diagnosis not present

## 2022-07-28 DIAGNOSIS — M898X5 Other specified disorders of bone, thigh: Principal | ICD-10-CM | POA: Diagnosis present

## 2022-07-28 DIAGNOSIS — J449 Chronic obstructive pulmonary disease, unspecified: Secondary | ICD-10-CM | POA: Diagnosis not present

## 2022-07-28 DIAGNOSIS — E785 Hyperlipidemia, unspecified: Secondary | ICD-10-CM | POA: Diagnosis present

## 2022-07-28 DIAGNOSIS — Z823 Family history of stroke: Secondary | ICD-10-CM

## 2022-07-28 DIAGNOSIS — Z780 Asymptomatic menopausal state: Secondary | ICD-10-CM

## 2022-07-28 DIAGNOSIS — M19041 Primary osteoarthritis, right hand: Secondary | ICD-10-CM | POA: Diagnosis present

## 2022-07-28 DIAGNOSIS — Z923 Personal history of irradiation: Secondary | ICD-10-CM

## 2022-07-28 DIAGNOSIS — Z8673 Personal history of transient ischemic attack (TIA), and cerebral infarction without residual deficits: Secondary | ICD-10-CM | POA: Diagnosis not present

## 2022-07-28 DIAGNOSIS — J45991 Cough variant asthma: Secondary | ICD-10-CM | POA: Diagnosis present

## 2022-07-28 DIAGNOSIS — E042 Nontoxic multinodular goiter: Secondary | ICD-10-CM | POA: Diagnosis present

## 2022-07-28 DIAGNOSIS — C50919 Malignant neoplasm of unspecified site of unspecified female breast: Secondary | ICD-10-CM

## 2022-07-28 DIAGNOSIS — Z1501 Genetic susceptibility to malignant neoplasm of breast: Secondary | ICD-10-CM

## 2022-07-28 DIAGNOSIS — R52 Pain, unspecified: Secondary | ICD-10-CM | POA: Diagnosis not present

## 2022-07-28 DIAGNOSIS — Z85118 Personal history of other malignant neoplasm of bronchus and lung: Secondary | ICD-10-CM | POA: Diagnosis not present

## 2022-07-28 DIAGNOSIS — M19042 Primary osteoarthritis, left hand: Secondary | ICD-10-CM | POA: Diagnosis present

## 2022-07-28 DIAGNOSIS — I10 Essential (primary) hypertension: Secondary | ICD-10-CM | POA: Diagnosis not present

## 2022-07-28 DIAGNOSIS — I5032 Chronic diastolic (congestive) heart failure: Secondary | ICD-10-CM | POA: Diagnosis present

## 2022-07-28 DIAGNOSIS — K219 Gastro-esophageal reflux disease without esophagitis: Secondary | ICD-10-CM | POA: Diagnosis present

## 2022-07-28 DIAGNOSIS — R7303 Prediabetes: Secondary | ICD-10-CM

## 2022-07-28 HISTORY — PX: FEMUR IM NAIL: SHX1597

## 2022-07-28 SURGERY — INSERTION, INTRAMEDULLARY ROD, FEMUR
Anesthesia: General | Laterality: Left

## 2022-07-28 MED ORDER — KETOROLAC TROMETHAMINE 15 MG/ML IJ SOLN
7.5000 mg | Freq: Four times a day (QID) | INTRAMUSCULAR | Status: AC
  Administered 2022-07-28 – 2022-07-29 (×4): 7.5 mg via INTRAVENOUS
  Filled 2022-07-28 (×3): qty 1

## 2022-07-28 MED ORDER — ACETAMINOPHEN 500 MG PO TABS: 1000.0000 mg | ORAL_TABLET | Freq: Once | ORAL | Status: DC

## 2022-07-28 MED ORDER — HYDROCODONE-ACETAMINOPHEN 5-325 MG PO TABS
1.0000 | ORAL_TABLET | ORAL | Status: DC | PRN
  Administered 2022-07-29: 1 via ORAL
  Filled 2022-07-28: qty 1

## 2022-07-28 MED ORDER — DORZOLAMIDE HCL-TIMOLOL MAL 2-0.5 % OP SOLN
1.0000 [drp] | Freq: Every day | OPHTHALMIC | Status: DC
  Administered 2022-07-28: 1 [drp] via OPHTHALMIC
  Filled 2022-07-28: qty 10

## 2022-07-28 MED ORDER — DILTIAZEM HCL ER COATED BEADS 180 MG PO CP24
300.0000 mg | ORAL_CAPSULE | Freq: Every day | ORAL | Status: DC
  Administered 2022-07-29: 300 mg via ORAL
  Filled 2022-07-28: qty 1

## 2022-07-28 MED ORDER — APOAEQUORIN 10 MG PO CAPS: 1.0000 | ORAL_CAPSULE | Freq: Every morning | ORAL | Status: DC

## 2022-07-28 MED ORDER — THYROID 60 MG PO TABS
75.0000 mg | ORAL_TABLET | Freq: Every day | ORAL | Status: DC
  Administered 2022-07-29: 75 mg via ORAL
  Filled 2022-07-28: qty 1

## 2022-07-28 MED ORDER — LIDOCAINE HCL (PF) 2 % IJ SOLN
INTRAMUSCULAR | Status: AC
  Filled 2022-07-28: qty 5

## 2022-07-28 MED ORDER — FENTANYL CITRATE (PF) 100 MCG/2ML IJ SOLN
INTRAMUSCULAR | Status: DC | PRN
  Administered 2022-07-28 (×2): 50 ug via INTRAVENOUS

## 2022-07-28 MED ORDER — FUROSEMIDE 40 MG PO TABS
40.0000 mg | ORAL_TABLET | Freq: Two times a day (BID) | ORAL | Status: DC
  Administered 2022-07-28 – 2022-07-29 (×2): 40 mg via ORAL
  Filled 2022-07-28 (×2): qty 1

## 2022-07-28 MED ORDER — HYDROCODONE-ACETAMINOPHEN 7.5-325 MG PO TABS: 1.0000 | ORAL_TABLET | ORAL | Status: DC | PRN

## 2022-07-28 MED ORDER — LATANOPROST 0.005 % OP SOLN
1.0000 [drp] | Freq: Two times a day (BID) | OPHTHALMIC | Status: DC
  Administered 2022-07-28 – 2022-07-29 (×2): 1 [drp] via OPHTHALMIC
  Filled 2022-07-28: qty 2.5

## 2022-07-28 MED ORDER — POTASSIUM CHLORIDE CRYS ER 20 MEQ PO TBCR
20.0000 meq | EXTENDED_RELEASE_TABLET | Freq: Two times a day (BID) | ORAL | Status: DC
  Administered 2022-07-28 – 2022-07-29 (×2): 20 meq via ORAL
  Filled 2022-07-28 (×2): qty 1

## 2022-07-28 MED ORDER — ORAL CARE MOUTH RINSE: 15.0000 mL | Freq: Once | OROMUCOSAL | Status: AC

## 2022-07-28 MED ORDER — CHLORHEXIDINE GLUCONATE 0.12 % MT SOLN
15.0000 mL | Freq: Once | OROMUCOSAL | Status: AC
  Administered 2022-07-28: 15 mL via OROMUCOSAL

## 2022-07-28 MED ORDER — 0.9 % SODIUM CHLORIDE (POUR BTL) OPTIME
TOPICAL | Status: DC | PRN
  Administered 2022-07-28: 1000 mL

## 2022-07-28 MED ORDER — NETARSUDIL DIMESYLATE 0.02 % OP SOLN: 1.0000 [drp] | Freq: Every day | OPHTHALMIC | Status: DC

## 2022-07-28 MED ORDER — THYROID 60 MG PO TABS: 60.0000 mg | ORAL_TABLET | Freq: Every day | ORAL | Status: DC

## 2022-07-28 MED ORDER — HYDROMORPHONE HCL 1 MG/ML IJ SOLN: 0.2500 mg | INTRAMUSCULAR | Status: DC | PRN

## 2022-07-28 MED ORDER — BUPIVACAINE-EPINEPHRINE 0.5% -1:200000 IJ SOLN
INTRAMUSCULAR | Status: DC | PRN
  Administered 2022-07-28: 30 mL

## 2022-07-28 MED ORDER — METHOCARBAMOL 500 MG PO TABS: 500.0000 mg | ORAL_TABLET | Freq: Four times a day (QID) | ORAL | Status: DC | PRN

## 2022-07-28 MED ORDER — ONDANSETRON HCL 4 MG/2ML IJ SOLN
INTRAMUSCULAR | Status: AC
  Filled 2022-07-28: qty 2

## 2022-07-28 MED ORDER — ONDANSETRON HCL 4 MG PO TABS: 8.0000 mg | ORAL_TABLET | Freq: Three times a day (TID) | ORAL | Status: DC | PRN

## 2022-07-28 MED ORDER — EPINEPHRINE PF 1 MG/ML IJ SOLN
INTRAMUSCULAR | Status: AC
  Filled 2022-07-28: qty 1

## 2022-07-28 MED ORDER — DIPHENHYDRAMINE HCL 12.5 MG/5ML PO ELIX: 12.5000 mg | ORAL_SOLUTION | ORAL | Status: DC | PRN

## 2022-07-28 MED ORDER — PANTOPRAZOLE SODIUM 40 MG PO TBEC
40.0000 mg | DELAYED_RELEASE_TABLET | Freq: Every day | ORAL | Status: DC
  Administered 2022-07-29: 40 mg via ORAL
  Filled 2022-07-28: qty 1

## 2022-07-28 MED ORDER — PROMETHAZINE HCL 25 MG/ML IJ SOLN: 6.2500 mg | INTRAMUSCULAR | Status: DC | PRN

## 2022-07-28 MED ORDER — FLUTICASONE PROPIONATE 50 MCG/ACT NA SUSP
1.0000 | Freq: Every day | NASAL | Status: DC
  Administered 2022-07-29: 1 via NASAL
  Filled 2022-07-28: qty 16

## 2022-07-28 MED ORDER — MEMANTINE HCL 10 MG PO TABS
10.0000 mg | ORAL_TABLET | Freq: Two times a day (BID) | ORAL | Status: DC
  Administered 2022-07-28 – 2022-07-29 (×2): 10 mg via ORAL
  Filled 2022-07-28 (×2): qty 1

## 2022-07-28 MED ORDER — PHENYLEPHRINE HCL (PRESSORS) 10 MG/ML IV SOLN
INTRAVENOUS | Status: AC
  Filled 2022-07-28: qty 1

## 2022-07-28 MED ORDER — BUPIVACAINE HCL (PF) 0.5 % IJ SOLN
INTRAMUSCULAR | Status: AC
  Filled 2022-07-28: qty 30

## 2022-07-28 MED ORDER — ACETAMINOPHEN 325 MG PO TABS: 325.0000 mg | ORAL_TABLET | Freq: Four times a day (QID) | ORAL | Status: DC | PRN

## 2022-07-28 MED ORDER — LACTATED RINGERS IV SOLN: INTRAVENOUS | Status: DC

## 2022-07-28 MED ORDER — PHENYLEPHRINE HCL-NACL 20-0.9 MG/250ML-% IV SOLN
INTRAVENOUS | Status: DC | PRN
  Administered 2022-07-28: 40 ug/min via INTRAVENOUS

## 2022-07-28 MED ORDER — OXYCODONE HCL 5 MG/5ML PO SOLN: 5.0000 mg | Freq: Once | ORAL | Status: DC | PRN

## 2022-07-28 MED ORDER — METOPROLOL SUCCINATE ER 50 MG PO TB24
75.0000 mg | ORAL_TABLET | Freq: Every day | ORAL | Status: DC
  Filled 2022-07-28 (×3): qty 1

## 2022-07-28 MED ORDER — APIXABAN 5 MG PO TABS
5.0000 mg | ORAL_TABLET | Freq: Two times a day (BID) | ORAL | Status: DC
  Administered 2022-07-29: 5 mg via ORAL
  Filled 2022-07-28: qty 1

## 2022-07-28 MED ORDER — PHENYLEPHRINE 80 MCG/ML (10ML) SYRINGE FOR IV PUSH (FOR BLOOD PRESSURE SUPPORT)
PREFILLED_SYRINGE | INTRAVENOUS | Status: DC | PRN
  Administered 2022-07-28: 160 ug via INTRAVENOUS
  Administered 2022-07-28 (×4): 80 ug via INTRAVENOUS

## 2022-07-28 MED ORDER — METOCLOPRAMIDE HCL 5 MG/ML IJ SOLN: 5.0000 mg | Freq: Three times a day (TID) | INTRAMUSCULAR | Status: DC | PRN

## 2022-07-28 MED ORDER — PROPOFOL 10 MG/ML IV BOLUS
INTRAVENOUS | Status: DC | PRN
  Administered 2022-07-28: 60 mg via INTRAVENOUS

## 2022-07-28 MED ORDER — ONDANSETRON HCL 4 MG/2ML IJ SOLN: 4.0000 mg | Freq: Four times a day (QID) | INTRAMUSCULAR | Status: DC | PRN

## 2022-07-28 MED ORDER — ACETAMINOPHEN 500 MG PO TABS
500.0000 mg | ORAL_TABLET | Freq: Four times a day (QID) | ORAL | Status: AC
  Administered 2022-07-28 – 2022-07-29 (×4): 500 mg via ORAL
  Filled 2022-07-28 (×3): qty 1

## 2022-07-28 MED ORDER — METOCLOPRAMIDE HCL 5 MG PO TABS: 5.0000 mg | ORAL_TABLET | Freq: Three times a day (TID) | ORAL | Status: DC | PRN

## 2022-07-28 MED ORDER — ALBUTEROL SULFATE HFA 108 (90 BASE) MCG/ACT IN AERS: 2.0000 | INHALATION_SPRAY | Freq: Four times a day (QID) | RESPIRATORY_TRACT | Status: DC | PRN

## 2022-07-28 MED ORDER — ATORVASTATIN CALCIUM 40 MG PO TABS
40.0000 mg | ORAL_TABLET | Freq: Every day | ORAL | Status: DC
  Administered 2022-07-28: 40 mg via ORAL
  Filled 2022-07-28: qty 1

## 2022-07-28 MED ORDER — LEVETIRACETAM ER 500 MG PO TB24
500.0000 mg | ORAL_TABLET | Freq: Every day | ORAL | Status: DC
  Administered 2022-07-28: 500 mg via ORAL
  Filled 2022-07-28 (×2): qty 1

## 2022-07-28 MED ORDER — CALCIUM CARBONATE-VITAMIN D 500-200 MG-UNIT PO TABS: 1.0000 | ORAL_TABLET | Freq: Every day | ORAL | Status: DC

## 2022-07-28 MED ORDER — OYSTER SHELL CALCIUM/D3 500-5 MG-MCG PO TABS
1.0000 | ORAL_TABLET | Freq: Every day | ORAL | Status: DC
  Administered 2022-07-29: 1 via ORAL
  Filled 2022-07-28: qty 1

## 2022-07-28 MED ORDER — ROCURONIUM BROMIDE 10 MG/ML (PF) SYRINGE
PREFILLED_SYRINGE | INTRAVENOUS | Status: DC | PRN
  Administered 2022-07-28: 60 mg via INTRAVENOUS

## 2022-07-28 MED ORDER — MORPHINE SULFATE (PF) 2 MG/ML IV SOLN: 0.5000 mg | INTRAVENOUS | Status: DC | PRN

## 2022-07-28 MED ORDER — DEXAMETHASONE SODIUM PHOSPHATE 10 MG/ML IJ SOLN
INTRAMUSCULAR | Status: AC
  Filled 2022-07-28: qty 1

## 2022-07-28 MED ORDER — ONDANSETRON HCL 4 MG/2ML IJ SOLN
INTRAMUSCULAR | Status: DC | PRN
  Administered 2022-07-28: 4 mg via INTRAVENOUS

## 2022-07-28 MED ORDER — FENTANYL CITRATE (PF) 100 MCG/2ML IJ SOLN
INTRAMUSCULAR | Status: AC
  Filled 2022-07-28: qty 2

## 2022-07-28 MED ORDER — PROCHLORPERAZINE MALEATE 10 MG PO TABS: 10.0000 mg | ORAL_TABLET | Freq: Four times a day (QID) | ORAL | Status: DC | PRN

## 2022-07-28 MED ORDER — DEXAMETHASONE SODIUM PHOSPHATE 10 MG/ML IJ SOLN
INTRAMUSCULAR | Status: DC | PRN
  Administered 2022-07-28: 4 mg via INTRAVENOUS

## 2022-07-28 MED ORDER — ONDANSETRON HCL 4 MG PO TABS: 4.0000 mg | ORAL_TABLET | Freq: Four times a day (QID) | ORAL | Status: DC | PRN

## 2022-07-28 MED ORDER — GABAPENTIN 400 MG PO CAPS
400.0000 mg | ORAL_CAPSULE | Freq: Two times a day (BID) | ORAL | Status: DC
  Administered 2022-07-28 – 2022-07-29 (×2): 400 mg via ORAL
  Filled 2022-07-28 (×2): qty 1

## 2022-07-28 MED ORDER — DOCUSATE SODIUM 100 MG PO CAPS
100.0000 mg | ORAL_CAPSULE | Freq: Two times a day (BID) | ORAL | Status: DC
  Administered 2022-07-28 – 2022-07-29 (×2): 100 mg via ORAL
  Filled 2022-07-28 (×2): qty 1

## 2022-07-28 MED ORDER — FLUTICASONE FUROATE-VILANTEROL 200-25 MCG/ACT IN AEPB
1.0000 | INHALATION_SPRAY | Freq: Every day | RESPIRATORY_TRACT | Status: DC
  Administered 2022-07-29: 1 via RESPIRATORY_TRACT
  Filled 2022-07-28: qty 28

## 2022-07-28 MED ORDER — MIDAZOLAM HCL 2 MG/2ML IJ SOLN: 0.5000 mg | Freq: Once | INTRAMUSCULAR | Status: DC | PRN

## 2022-07-28 MED ORDER — METHOCARBAMOL 1000 MG/10ML IJ SOLN: 500.0000 mg | Freq: Four times a day (QID) | INTRAVENOUS | Status: DC | PRN

## 2022-07-28 MED ORDER — OXYCODONE HCL 5 MG PO TABS: 5.0000 mg | ORAL_TABLET | Freq: Once | ORAL | Status: DC | PRN

## 2022-07-28 MED ORDER — STERILE WATER FOR IRRIGATION IR SOLN
Status: DC | PRN
  Administered 2022-07-28: 2000 mL

## 2022-07-28 MED ORDER — LORATADINE 10 MG PO TABS
10.0000 mg | ORAL_TABLET | Freq: Every day | ORAL | Status: DC
  Administered 2022-07-29: 10 mg via ORAL
  Filled 2022-07-28: qty 1

## 2022-07-28 MED ORDER — CEFAZOLIN SODIUM-DEXTROSE 2-4 GM/100ML-% IV SOLN
2.0000 g | INTRAVENOUS | Status: AC
  Administered 2022-07-28: 2 g via INTRAVENOUS
  Filled 2022-07-28: qty 100

## 2022-07-28 MED ORDER — ROCURONIUM BROMIDE 10 MG/ML (PF) SYRINGE
PREFILLED_SYRINGE | INTRAVENOUS | Status: AC
  Filled 2022-07-28: qty 10

## 2022-07-28 MED ORDER — THYROID 30 MG PO TABS: 15.0000 mg | ORAL_TABLET | Freq: Every day | ORAL | Status: DC

## 2022-07-28 SURGICAL SUPPLY — 43 items
BAG COUNTER SPONGE SURGICOUNT (BAG) IMPLANT
BAG SPEC THK2 15X12 ZIP CLS (MISCELLANEOUS)
BAG SPNG CNTER NS LX DISP (BAG)
BAG ZIPLOCK 12X15 (MISCELLANEOUS) IMPLANT
BIT DRILL 4.3MMS DISTAL GRDTED (BIT) IMPLANT
BLADE SURG 15 STRL LF DISP TIS (BLADE) ×1 IMPLANT
BLADE SURG 15 STRL SS (BLADE) ×1
BNDG GAUZE DERMACEA FLUFF 4 (GAUZE/BANDAGES/DRESSINGS) ×1 IMPLANT
BNDG GZE DERMACEA 4 6PLY (GAUZE/BANDAGES/DRESSINGS) ×1
COVER SURGICAL LIGHT HANDLE (MISCELLANEOUS) ×1 IMPLANT
DRAPE C-ARMOR (DRAPES) IMPLANT
DRAPE STERI IOBAN 125X83 (DRAPES) ×1 IMPLANT
DRILL 4.3MMS DISTAL GRADUATED (BIT) ×1
DRSG AQUACEL AG ADV 3.5X 4 (GAUZE/BANDAGES/DRESSINGS) ×2 IMPLANT
DRSG AQUACEL AG ADV 3.5X 6 (GAUZE/BANDAGES/DRESSINGS) IMPLANT
DURAPREP 26ML APPLICATOR (WOUND CARE) ×2 IMPLANT
ELECT REM PT RETURN 15FT ADLT (MISCELLANEOUS) ×1 IMPLANT
GLOVE BIO SURGEON STRL SZ8 (GLOVE) ×2 IMPLANT
GLOVE BIOGEL PI IND STRL 7.0 (GLOVE) ×1 IMPLANT
GLOVE BIOGEL PI IND STRL 8 (GLOVE) ×2 IMPLANT
GLOVE SURG SYN 7.0 (GLOVE) ×1 IMPLANT
GLOVE SURG SYN 7.0 PF PI (GLOVE) ×1 IMPLANT
GOWN SRG XL LVL 4 BRTHBL STRL (GOWNS) ×1 IMPLANT
GOWN STRL NON-REIN XL LVL4 (GOWNS) ×1
GOWN STRL REUS W/ TWL XL LVL3 (GOWN DISPOSABLE) ×2 IMPLANT
GOWN STRL REUS W/TWL XL LVL3 (GOWN DISPOSABLE) ×2
GUIDEPIN VERSANAIL DSP 3.2X444 (ORTHOPEDIC DISPOSABLE SUPPLIES) IMPLANT
GUIDEWIRE BALL NOSE 100CM (WIRE) IMPLANT
HIP FRA NAIL LAG SCREW 10.5X90 (Orthopedic Implant) ×1 IMPLANT
KIT BASIN OR (CUSTOM PROCEDURE TRAY) ×1 IMPLANT
KIT TURNOVER KIT A (KITS) IMPLANT
MANIFOLD NEPTUNE II (INSTRUMENTS) ×1 IMPLANT
NS IRRIG 1000ML POUR BTL (IV SOLUTION) IMPLANT
PACK GENERAL/GYN (CUSTOM PROCEDURE TRAY) ×1 IMPLANT
PROTECTOR NERVE ULNAR (MISCELLANEOUS) ×1 IMPLANT
SCREW BONE CORTICAL 5.0X42 (Screw) IMPLANT
SCREW LAG HIP FRA NAIL 10.5X90 (Orthopedic Implant) IMPLANT
SCREW LAG HIP NAIL 11X320 (Screw) IMPLANT
STAPLER VISISTAT 35W (STAPLE) ×1 IMPLANT
SUT VIC AB 0 CT1 27 (SUTURE)
SUT VIC AB 0 CT1 27XBRD ANTBC (SUTURE) IMPLANT
SUT VIC AB 2-0 CT1 27 (SUTURE) ×1
SUT VIC AB 2-0 CT1 TAPERPNT 27 (SUTURE) ×1 IMPLANT

## 2022-07-28 NOTE — Progress Notes (Signed)
PHARMACIST - PHYSICIAN ORDER COMMUNICATION  CONCERNING: P&T Medication Policy on Herbal Medications  DESCRIPTION:  This patient's order for:  Apoaequorin has been noted.  This product(s) is classified as an "herbal" or natural product. Due to a lack of definitive safety studies or FDA approval, nonstandard manufacturing practices, plus the potential risk of unknown drug-drug interactions while on inpatient medications, the Pharmacy and Therapeutics Committee does not permit the use of "herbal" or natural products of this type within [facility].   ACTION TAKEN: The pharmacy department is unable to verify this order at this time and your patient has been informed of this safety policy. Please reevaluate patient's clinical condition at discharge and address if the herbal or natural product(s) should be resumed at that time.   Lorenia Hoston S. Merilynn Finland, PharmD, BCPS Clinical Staff Pharmacist [email or web address]

## 2022-07-28 NOTE — Brief Op Note (Signed)
Patricia [name] [id number] 07/28/2022   PRE-OP DIAGNOSIS: left femur metastasis  POST-OP DIAGNOSIS: same  PROCEDURE: left femur IM nail  ANESTHESIA: general  Velna Ochs   Dictation #:  [id number]

## 2022-07-28 NOTE — Op Note (Unsigned)
Patricia [name], ABDELAZIZ MEDICAL RECORD NO: [id number] ACCOUNT NO: [id number] DATE OF BIRTH: [birth year] FACILITY: [facility] LOCATION: [facility] PHYSICIAN: Lubertha Basque. Jerl Santos, MD  Operative Report   DATE OF PROCEDURE: 07/28/2022  PREOPERATIVE DIAGNOSES: 1.  Secondary malignant neoplasm of left femur. 2.  Breast cancer.  POSTOPERATIVE DIAGNOSES: 1.  Secondary malignant neoplasm of left femur. 2.  Breast cancer.  PROCEDURE:  Left femur trochanteric nail.  ANESTHESIA:  General.  ATTENDING SURGEON:  Lubertha Basque. Jerl Santos, MD.  ASSISTANT:  Elodia Florence, PA.  INDICATIONS FOR PROCEDURE:  The patient is an 82 year old woman with metastatic breast cancer.  She unfortunately has a lesion in her femur, which has eroded most of the cortex and is prone to fracture.  She is offered intramedullary stabilization in  hopes of preventing a fracture.  Informed operative consent was obtained after discussion of possible complications including reaction to anesthesia, infection, and obviously fracture.  SUMMARY OF FINDINGS AND PROCEDURE:  Under general anesthesia, we placed a long trochanteric nail bridging the area of the lesion.  This was an 11 x 320 Affixus nail by [employer].  We locked it proximally with a 90 mm screw and distally with the 42 mm  screw.  I used fluoroscopy throughout the case to make appropriate intraoperative decisions and read these views myself.  Elodia Florence assisted throughout and was invaluable to the completion of the case and that he helped pass instruments and close  simultaneously to help minimize OR time.  DESCRIPTION OF PROCEDURE:  The patient was taken to the operating suite where general anesthetic was applied without difficulty.  She was placed on a Hana table and prepped and draped in normal sterile fashion.  After the administration of preoperative  IV Kefzol and appropriate timeout, I made a small incision proximal to the greater trochanter.  I placed a guidewire intramedullary  through the greater trochanter.  I then overreamed to about the level of the lesser trochanter with the introduction  reamer.  I then placed a guidewire through this entry spot all the way to the knee seemed to be below the patella on 2 views fluoroscopic.  We then utilized this guidewire to overream to a diameter of 12.5 mm.  I then placed the aforementioned Affixus  nail into appropriate position.  I then made a separate stab wound below the greater trochanter and placed a trocar to place the proximal locking screw and this was seen to be central in the femoral head on AP and lateral fluoroscopic views.  We then  looked at the lesion site, which was undisturbed by fluoroscopy.  I then looked distal at the static locking screw and I made another small incision there and placed a distal locking screw, which engaged both cortices well.  This also was confirmed to be  in good position by fluoroscopy in 2 planes.  I then irrigated all 3 incision sites.  We reapproximated deep tissues with Vicryl and skin with staples.  Adaptic was applied followed by dry gauze and some tape.  Estimated blood loss and intraoperative  fluids can be obtained from anesthesia records.  DISPOSITION:  The patient was extubated in the operating room and taken to recovery room in stable condition.  Plans were for her to stay at least overnight and to resume anticoagulation immediately.   PUS D: 07/28/2022 3:38:08 pm T: 07/28/2022 4:00:00 pm  JOB: [id number]/ [id number]

## 2022-07-28 NOTE — Interval H&P Note (Signed)
History and Physical Interval Note:  07/28/2022 1:25 PM  Patricia [name]  has presented today for surgery, with the diagnosis of LEFT FEMUR LESION.  The various methods of treatment have been discussed with the patient and family. After consideration of risks, benefits and other options for treatment, the patient has consented to  Procedure(s): INTRAMEDULLARY (IM) NAIL FEMORAL (Left) as a surgical intervention.  The patient's history has been reviewed, patient examined, no change in status, stable for surgery.  I have reviewed the patient's chart and labs.  Questions were answered to the patient's satisfaction.     Velna Ochs

## 2022-07-28 NOTE — Transfer of Care (Signed)
Immediate Anesthesia Transfer of Care Note  Patient: Patricia [name]  Procedure(s) Performed: INTRAMEDULLARY (IM) NAIL FEMORAL (Left)  Patient Location: PACU  Anesthesia Type:General  Level of Consciousness: drowsy and patient cooperative  Airway & Oxygen Therapy: Patient Spontanous Breathing and Patient connected to face mask oxygen  Post-op Assessment: Report given to RN and Post -op Vital signs reviewed and stable  Post vital signs: Reviewed and stable  Last Vitals:  Vitals Value Taken Time  BP 113/84 07/28/22 1543  Temp    Pulse 70 07/28/22 1543  Resp 16 07/28/22 1545  SpO2 100 % 07/28/22 1543  Vitals shown include unvalidated device data.  Last Pain:  Vitals:   07/28/22 1218  TempSrc:   PainSc: 0-No pain      Patients Stated Pain Goal: 4 (07/28/22 1218)  Complications: No notable events documented.

## 2022-07-28 NOTE — Anesthesia Postprocedure Evaluation (Signed)
Anesthesia Post Note  Patient: Patricia [name]  Procedure(s) Performed: INTRAMEDULLARY (IM) NAIL FEMORAL (Left)     Patient location during evaluation: PACU Anesthesia Type: General Level of consciousness: awake and alert, patient cooperative and oriented Pain management: pain level controlled Vital Signs Assessment: post-procedure vital signs reviewed and stable Respiratory status: spontaneous breathing, nonlabored ventilation and respiratory function stable Cardiovascular status: blood pressure returned to baseline and stable Postop Assessment: no apparent nausea or vomiting Anesthetic complications: no   No notable events documented.  Last Vitals:  Vitals:   07/28/22 1210 07/28/22 1545  BP: 121/82 105/67  Pulse: 94 70  Resp: 18 16  Temp: 36.4 C 36.5 C  SpO2: 95% 100%    Last Pain:  Vitals:   07/28/22 1545  TempSrc:   PainSc: 0-No pain                 Tobechukwu Emmick,E. Julyanna Scholle

## 2022-07-28 NOTE — Anesthesia Procedure Notes (Signed)
Procedure Name: Intubation Date/Time: 07/28/2022 2:23 PM  Performed by: Adria Dill, CRNAPre-anesthesia Checklist: Patient identified, Emergency Drugs available, Suction available and Patient being monitored Patient Re-evaluated:Patient Re-evaluated prior to induction Oxygen Delivery Method: Circle system utilized Preoxygenation: Pre-oxygenation with 100% oxygen Induction Type: IV induction Ventilation: Mask ventilation without difficulty Laryngoscope Size: Miller and 3 Grade View: Grade I Tube type: Oral Tube size: 7.0 mm Number of attempts: 1 Airway Equipment and Method: Stylet and Oral airway Placement Confirmation: ETT inserted through vocal cords under direct vision, positive ETCO2 and breath sounds checked- equal and bilateral Secured at: 20 cm Tube secured with: Tape Dental Injury: Teeth and Oropharynx as per pre-operative assessment

## 2022-07-28 NOTE — Anesthesia Preprocedure Evaluation (Addendum)
Anesthesia Evaluation  Patient identified by MRN, date of birth, ID band Patient awake    Reviewed: Allergy & Precautions, NPO status , Patient's Chart, lab work & pertinent test results, reviewed documented beta blocker date and time   History of Anesthesia Complications Negative for: history of anesthetic complications  Airway Mallampati: I  TM Distance: >3 FB Neck ROM: Full    Dental  (+) Dental Advisory Given, Implants, Caps   Pulmonary COPD,  COPD inhaler, former smoker Lung carcinoid   breath sounds clear to auscultation       Cardiovascular hypertension, Pt. on medications and Pt. 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   Abdominal   Peds  Hematology Eliquis: last dose Fri afternoon   Anesthesia Other Findings Breast cancer  Reproductive/Obstetrics                             Anesthesia Physical Anesthesia Plan  ASA: 4  Anesthesia Plan: General   Post-op Pain Management: Tylenol PO (pre-op)*   Induction: Intravenous  PONV Risk Score and Plan: 3 and Ondansetron, Dexamethasone and Treatment may vary due to age or medical condition  Airway Management Planned: Oral ETT  Additional Equipment: None  Intra-op Plan:   Post-operative Plan: Extubation in OR  Informed Consent: I have reviewed the patients History and Physical, chart, labs and discussed the procedure including the risks, benefits  and alternatives for the proposed anesthesia with the patient or authorized representative who has indicated his/her understanding and acceptance.     Dental advisory given and Consent reviewed with POA  Plan Discussed with: CRNA and Surgeon  Anesthesia Plan Comments: (Discussed with patient and her husband)       Anesthesia Quick Evaluation

## 2022-07-29 ENCOUNTER — Encounter (HOSPITAL_COMMUNITY): Payer: Self-pay | Admitting: Orthopaedic Surgery

## 2022-07-29 ENCOUNTER — Other Ambulatory Visit: Payer: Self-pay | Admitting: Cardiology

## 2022-07-29 DIAGNOSIS — C7951 Secondary malignant neoplasm of bone: Secondary | ICD-10-CM

## 2022-07-29 DIAGNOSIS — R52 Pain, unspecified: Secondary | ICD-10-CM | POA: Diagnosis not present

## 2022-07-29 DIAGNOSIS — Z171 Estrogen receptor negative status [ER-]: Secondary | ICD-10-CM

## 2022-07-29 DIAGNOSIS — C50919 Malignant neoplasm of unspecified site of unspecified female breast: Secondary | ICD-10-CM

## 2022-07-29 MED ORDER — TRAMADOL HCL 50 MG PO TABS: 50.0000 mg | ORAL_TABLET | Freq: Four times a day (QID) | ORAL | 0 refills | Status: DC | PRN

## 2022-07-29 NOTE — Progress Notes (Signed)
Subjective: 1 Day Post-Op Procedure(s) (LRB): INTRAMEDULLARY (IM) NAIL FEMORAL (Left)  Patient doing well this morning. She has no pain and is hoping to go home today.  Activity level:  wbat Diet tolerance:  ok Voiding:  ok Patient reports pain as mild.    Objective: Vital signs in last 24 hours: Temp:  [97.6 F (36.4 C)-98.2 F (36.8 C)] 97.9 F (36.6 C) (05/29 0659) Pulse Rate:  [66-101] 74 (05/29 0128) Resp:  [11-18] 16 (05/29 0659) BP: (94-121)/(62-87) 95/76 (05/29 0659) SpO2:  [93 %-100 %] 98 % (05/29 0818) Weight:  [65.8 kg] 65.8 kg (05/28 1218)  Labs: No results for input(s): "HGB" in the last 72 hours. No results for input(s): "WBC", "RBC", "HCT", "PLT" in the last 72 hours. No results for input(s): "NA", "K", "CL", "CO2", "BUN", "CREATININE", "GLUCOSE", "CALCIUM" in the last 72 hours. No results for input(s): "LABPT", "INR" in the last 72 hours.  Physical Exam:  Neurologically intact ABD soft Neurovascular intact Sensation intact distally Intact pulses distally Dorsiflexion/Plantar flexion intact Incision: dressing C/D/I and no drainage No cellulitis present Compartment soft  Assessment/Plan:  1 Day Post-Op Procedure(s) (LRB): INTRAMEDULLARY (IM) NAIL FEMORAL (Left) Advance diet Up with therapy Discharge home with home health today if cleared and moving well with PT. Follow up in office 2 weeks post op. Weightbearing as tolerated. Resume baseline eliquis today    Ginger Organ Sabre Romberger 07/29/2022, 8:22 AM

## 2022-07-29 NOTE — Evaluation (Signed)
Physical Therapy Evaluation Patient Details Name: Patricia [name] MRN: [id number] DOB: Jul 16, 1940 Today's Date: 07/29/2022  History of Present Illness  83 y.o. female admitted 07/28/22 for prophylactic IM nail L femur 2* metastatic lesion. PMH: afib, lung cancer, metastatic breast cancer, CKD.  Clinical Impression  Pt admitted with above diagnosis. Pt ambulated 69' with RW, no loss of balance, distance limited by fatigue and pain. Initiated HEP. Good progress expected.  Pt currently with functional limitations due to the deficits listed below (see PT Problem List). Pt will benefit from acute skilled PT to increase their independence and safety with mobility to allow discharge.          Recommendations for follow up therapy are one component of a multi-disciplinary discharge planning process, led by the attending physician.  Recommendations may be updated based on patient status, additional functional criteria and insurance authorization.  Follow Up Recommendations       Assistance Recommended at Discharge Intermittent Supervision/Assistance  Patient can return home with the following  A little help with walking and/or transfers;A little help with bathing/dressing/bathroom;Assistance with cooking/housework;Assist for transportation;Help with stairs or ramp for entrance    Equipment Recommendations Rolling walker (2 wheels)  Recommendations for Other Services       Functional Status Assessment Patient has had a recent decline in their functional status and demonstrates the ability to make significant improvements in function in a reasonable and predictable amount of time.     Precautions / Restrictions Precautions Precautions: Fall Restrictions Weight Bearing Restrictions: No LLE Weight Bearing: Weight bearing as tolerated      Mobility  Bed Mobility Overal bed mobility: Needs Assistance Bed Mobility: Supine to Sit     Supine to sit: Min assist     General bed mobility  comments: assist to advance LLE and raise trunk    Transfers Overall transfer level: Needs assistance Equipment used: Rolling walker (2 wheels) Transfers: Sit to/from Stand Sit to Stand: Min assist           General transfer comment: VCs hand placement, min A to power up    Ambulation/Gait Ambulation/Gait assistance: Min guard Gait Distance (Feet): 45 Feet Assistive device: Rolling walker (2 wheels) Gait Pattern/deviations: Step-to pattern, Decreased step length - right, Decreased step length - left Gait velocity: decr     General Gait Details: VCs sequencing, no loss of balance, distance limited by pain/fatigue  Stairs            Wheelchair Mobility    Modified Rankin (Stroke Patients Only)       Balance Overall balance assessment: Needs assistance Sitting-balance support: Feet supported, No upper extremity supported Sitting balance-Leahy Scale: Good     Standing balance support: Bilateral upper extremity supported, Reliant on assistive device for balance, During functional activity Standing balance-Leahy Scale: Poor                               Pertinent Vitals/Pain Pain Assessment Pain Assessment: 0-10 Pain Score: 6  Pain Location: L hip with movement Pain Descriptors / Indicators: Sore Pain Intervention(s): Limited activity within patient's tolerance, Monitored during session, Premedicated before session, Ice applied    Home Living Family/patient expects to be discharged to:: Private residence Living Arrangements: Spouse/significant other Available Help at Discharge: Family;Available 24 hours/day   Home Access: Stairs to enter Entrance Stairs-Rails: Right Entrance Stairs-Number of Steps: 3   Home Layout: One level Home Equipment: Cane - single point  Prior Function Prior Level of Function : Independent/Modified Independent             Mobility Comments: walked with SPC; denied falls in past 6 months       Hand  Dominance        Extremity/Trunk Assessment   Upper Extremity Assessment Upper Extremity Assessment: Overall WFL for tasks assessed    Lower Extremity Assessment Lower Extremity Assessment: LLE deficits/detail LLE Deficits / Details: knee ext at least 3/5, hip flexion AAROM ~35* limited by pain LLE Sensation: WNL LLE Coordination: WNL    Cervical / Trunk Assessment Cervical / Trunk Assessment: Normal  Communication   Communication: No difficulties  Cognition Arousal/Alertness: Awake/alert Behavior During Therapy: WFL for tasks assessed/performed Overall Cognitive Status: Within Functional Limits for tasks assessed                                          General Comments      Exercises General Exercises - Lower Extremity Ankle Circles/Pumps: AROM, Both, 10 reps, Supine Quad Sets: AROM, Both, 5 reps, Supine Heel Slides: AAROM, Left, 10 reps, Supine Hip ABduction/ADduction: AAROM, Left, 10 reps, Supine   Assessment/Plan    PT Assessment Patient needs continued PT services  PT Problem List Decreased activity tolerance;Decreased balance;Pain;Decreased mobility       PT Treatment Interventions Gait training;Therapeutic exercise;Therapeutic activities;DME instruction;Stair training;Patient/family education;Functional mobility training    PT Goals (Current goals can be found in the Care Plan section)  Acute Rehab PT Goals Patient Stated Goal: to be able to walk PT Goal Formulation: With patient Time For Goal Achievement: 08/05/22 Potential to Achieve Goals: Good    Frequency Min 6X/week     Co-evaluation               AM-PAC PT "6 Clicks" Mobility  Outcome Measure Help needed turning from your back to your side while in a flat bed without using bedrails?: A Little Help needed moving from lying on your back to sitting on the side of a flat bed without using bedrails?: A Little Help needed moving to and from a bed to a chair (including a  wheelchair)?: A Little Help needed standing up from a chair using your arms (e.g., wheelchair or bedside chair)?: A Little Help needed to walk in hospital room?: A Little Help needed climbing 3-5 steps with a railing? : A Lot 6 Click Score: 17    End of Session Equipment Utilized During Treatment: Gait belt Activity Tolerance: Patient tolerated treatment well;Patient limited by fatigue Patient left: in chair Nurse Communication: Mobility status PT Visit Diagnosis: Difficulty in walking, not elsewhere classified (R26.2);Pain Pain - Right/Left: Left Pain - part of body: Hip    Time: 1610-9604 PT Time Calculation (min) (ACUTE ONLY): 17 min   Charges:   PT Evaluation $PT Eval Moderate Complexity: 1 Mod         Tamala Ser PT 07/29/2022  Acute Rehabilitation Services  Office [phone]

## 2022-07-29 NOTE — TOC Transition Note (Signed)
Transition of Care [facility]) - CM/SW Discharge Note  Patient Details  Name: Patricia [name] MRN: [id number] Date of Birth: May 03, 1940  Transition of Care [facility]) CM/SW Contact:  Ewing Schlein, LCSW Phone Number: 07/29/2022, 10:17 AM  Clinical Narrative: Patient is expected to discharge home after working with PT. CSW met with patient to confirm discharge plan and needs. Patient will go home without PT per ortho PA, but can be set up with OPPT if needed. Patient will need a youth rolling walker, which was delivered by [employer]. TOC signing off.  Final next level of care: Home/Self Care Barriers to Discharge: No Barriers Identified  Patient Goals and CMS Choice CMS Medicare.gov Compare Post Acute Care list provided to:: Patient Choice offered to / list presented to : Patient  Discharge Plan and Services Additional resources added to the After Visit Summary for        DME Arranged: Walker rolling DME Agency: [employer] Date DME Agency Contacted: 07/29/22 Representative spoke with at DME Agency: Yvonna Alanis  Social Determinants of Health (SDOH) Interventions SDOH Screenings   Food Insecurity: No Food Insecurity (07/28/2022)  Housing: Low Risk  (07/28/2022)  Transportation Needs: No Transportation Needs (07/28/2022)  Utilities: Not At Risk (07/28/2022)  Alcohol Screen: Low Risk  (10/09/2021)  Depression (PHQ2-9): Low Risk  (04/23/2022)  Financial Resource Strain: Low Risk  (10/09/2021)  Physical Activity: Insufficiently Active (10/09/2021)  Social Connections: Socially Integrated (10/09/2021)  Stress: No Stress Concern Present (10/09/2021)  Tobacco Use: Medium Risk (07/28/2022)   Readmission Risk Interventions     No data to display

## 2022-07-29 NOTE — Progress Notes (Signed)
Physical Therapy Treatment Patient Details Name: Patricia [name] MRN: [id number] DOB: May 10, 1940 Today's Date: 07/29/2022   History of Present Illness 82 y.o. female admitted 07/28/22 for prophylactic IM nail L femur 2* metastatic lesion. PMH: afib, lung cancer, metastatic breast cancer, CKD.    PT Comments    Pt is progressing well with mobility, she tolerated increased ambulation distance of 34' with RW, no loss of balance. Stair training completed with spouse present. Reviewed HEP.  She is ready to DC home from a PT standpoint.    Recommendations for follow up therapy are one component of a multi-disciplinary discharge planning process, led by the attending physician.  Recommendations may be updated based on patient status, additional functional criteria and insurance authorization.  Follow Up Recommendations       Assistance Recommended at Discharge Intermittent Supervision/Assistance  Patient can return home with the following A little help with walking and/or transfers;A little help with bathing/dressing/bathroom;Assistance with cooking/housework;Assist for transportation;Help with stairs or ramp for entrance   Equipment Recommendations  Rolling walker (2 wheels)    Recommendations for Other Services       Precautions / Restrictions Precautions Precautions: Fall Restrictions Weight Bearing Restrictions: No LLE Weight Bearing: Weight bearing as tolerated     Mobility  Bed Mobility Overal bed mobility: Needs Assistance Bed Mobility: Supine to Sit     Supine to sit: Min assist     General bed mobility comments: up in recliner    Transfers Overall transfer level: Needs assistance Equipment used: Rolling walker (2 wheels) Transfers: Sit to/from Stand Sit to Stand: Supervision           General transfer comment: VCs hand placement    Ambulation/Gait Ambulation/Gait assistance: Supervision Gait Distance (Feet): 80 Feet Assistive device: Rolling walker (2  wheels) Gait Pattern/deviations: Step-to pattern, Decreased step length - right, Decreased step length - left Gait velocity: decr     General Gait Details: VCs sequencing, no loss of balance, distance limited by fatigue   Stairs Stairs: Yes Stairs assistance: Min guard Stair Management: Step to pattern, With cane, One rail Right, Forwards Number of Stairs: 3 General stair comments: VCs sequencing   Wheelchair Mobility    Modified Rankin (Stroke Patients Only)       Balance Overall balance assessment: Needs assistance Sitting-balance support: Feet supported, No upper extremity supported Sitting balance-Leahy Scale: Good     Standing balance support: Bilateral upper extremity supported, Reliant on assistive device for balance, During functional activity Standing balance-Leahy Scale: Poor                              Cognition Arousal/Alertness: Awake/alert Behavior During Therapy: WFL for tasks assessed/performed Overall Cognitive Status: Within Functional Limits for tasks assessed                                          Exercises General Exercises - Lower Extremity Ankle Circles/Pumps: AROM, Both, 10 reps, Supine Quad Sets: AROM, Both, 5 reps, Supine Short Arc Quad: AROM, Left, 5 reps, Seated Heel Slides: AAROM, Left, Supine, 5 reps Hip ABduction/ADduction: AAROM, Left, Supine, 5 reps    General Comments        Pertinent Vitals/Pain Pain Assessment Pain Assessment: 0-10 Pain Score: 4  Pain Location: L hip with movement Pain Descriptors / Indicators: Sore Pain Intervention(s): Limited activity within  patient's tolerance, Monitored during session, Premedicated before session, Ice applied    Home Living Family/patient expects to be discharged to:: Private residence Living Arrangements: Spouse/significant other Available Help at Discharge: Family;Available 24 hours/day   Home Access: Stairs to enter Entrance Stairs-Rails:  Right Entrance Stairs-Number of Steps: 3   Home Layout: One level Home Equipment: Cane - single point      Prior Function            PT Goals (current goals can now be found in the care plan section) Acute Rehab PT Goals Patient Stated Goal: to be able to walk PT Goal Formulation: With patient Time For Goal Achievement: 08/05/22 Potential to Achieve Goals: Good Progress towards PT goals: Progressing toward goals    Frequency    Min 6X/week      PT Plan Current plan remains appropriate    Co-evaluation              AM-PAC PT "6 Clicks" Mobility   Outcome Measure  Help needed turning from your back to your side while in a flat bed without using bedrails?: A Little Help needed moving from lying on your back to sitting on the side of a flat bed without using bedrails?: A Little Help needed moving to and from a bed to a chair (including a wheelchair)?: A Little Help needed standing up from a chair using your arms (e.g., wheelchair or bedside chair)?: A Little Help needed to walk in hospital room?: A Little Help needed climbing 3-5 steps with a railing? : A Little 6 Click Score: 18    End of Session Equipment Utilized During Treatment: Gait belt Activity Tolerance: Patient tolerated treatment well;Patient limited by fatigue Patient left: in chair;with call bell/phone within reach;with chair alarm set;with family/visitor present Nurse Communication: Mobility status PT Visit Diagnosis: Difficulty in walking, not elsewhere classified (R26.2);Pain Pain - Right/Left: Left Pain - part of body: Hip     Time: 1450-1515 PT Time Calculation (min) (ACUTE ONLY): 25 min  Charges:  $Gait Training: 8-22 mins $Therapeutic Exercise: 8-22 mins                     Ralene Bathe Kistler PT 07/29/2022  Acute Rehabilitation Services  Office [phone]

## 2022-07-29 NOTE — Consult Note (Signed)
[name] is well-known to me.  She is a very nice 82 year old white female.  She has recurrent/metastatic breast cancer.  Her disease has only been in her bones.  We have not had to treat her until recently.  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  She has had radiation in the past.  We have been given her Rivka Barbara to help with the bones.  When we last saw her, our staging studies showed there was a lesion in the left femoral shaft.  There is some cortical disruption.  She was having problems with pain.  We went ahead and got her to Orthopedic Surgery.  As always, Dr. Jerl Santos is very thorough.  He felt that she needed to have prophylactic pinning done.  This was done on 07/28/2022.  Everything went well.  Of note, we did give her 1 dose of Enhertu for treatment.  I think this is 07/16/2022.  She tolerated this well.  She has not complained of any pain this morning.  She does have atrial fibrillation.  She is on Eliquis.  She is going to the bathroom.  She I think we will start ambulating today.  I am just happy that she is able to have hip repair before she developed a pathologic fracture.  She has had no fever.  There has been no cough or shortness of breath.  She has had no nausea or vomiting.  She has been eating well.  Of note, her echocardiogram that was done on 07/21/2022 does show that she has a excellent ejection fraction of 65 to 70%.   Her physical exam shows vital signs 97.6.  Pulse 74.  Blood pressure 114/78.  Oxygen saturation is 98%.  Head and neck exam shows no ocular or oral lesions.  She has no palpable cervical or supraclavicular lymph nodes.  Lungs are clear bilaterally.  She has good air movement bilaterally.  Cardiac exam regular rate and rhythm consistent with atrial fibrillation.  The rate is well-controlled.  There is no murmurs.  Abdomen is soft.  She has good bowel sounds.  There is no fluid wave.  There is no palpable liver or spleen tip.  Extremities  shows the healing left hip repair scar.  Neurological exam is nonfocal.   [name] is a very charming 82 year old white female with metastatic breast cancer.  Her cancer is ER negative, which is quite usual for a postmenopausal woman, particularly when she is 82 years old.  She did receive a dose of Enhertu as her tumor was HER2 positive.  Hopefully, she will need to go home soon.  I know that Dr. Jerl Santos has done a fantastic job.  This will help [name] quality of life.  She wants to go and play golf again.  Hopefully, she will be able to do this.  We will follow along as long she is in the hospital and try to help out any way that we can.  I know that she will get fantastic care from everybody on [address].  Christin Bach, MD  Hebrews 12:12

## 2022-07-29 NOTE — Discharge Summary (Signed)
Patient ID: Jull Elias MRN: [id number] DOB/AGE: 09/03/1940 82 y.o.  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 Dx 04/2015 at time of stroke. b. DCCV 06/2015 - did not hold. Amio started then stopped due to QT prolongation.   Chronic cough    Chronic diastolic heart failure (HCC) 06/17/2015   CKD (chronic kidney disease), stage III (HCC)    Cough variant asthma    Dementia (HCC)    Disease of pharynx or nasopharynx    Diverticulosis    Enlargement of right atrium 01/23/2020   Essential hypertension    GERD (gastroesophageal reflux disease)    Glaucoma    Hiatal hernia    Hyperlipidemia    Hypothyroidism    Internal hemorrhoids    Laryngospasm    Mitral regurgitation    mild to moderate by echo 12/2019 and moderate by TEE 12/2020   Multinodular goiter    Osteopenia    Postmenopausal    Pulmonary HTN (HCC)     Moderate with PASP by echo 2018 likely Group 2 from pulmonary venous HTN from CHF>>normal PAP by echo 12/2019   Radiation 10/22/14-11/23/14   Left Breast   Stricture and stenosis of cervix    Stroke [facility])    TIAs    Surgeries: Procedure(s): INTRAMEDULLARY (IM) NAIL FEMORAL on 07/28/2022   Consultants: Treatment Team:  Josph Macho, MD  Discharged Condition: Improved  Hospital Course: Gaudalupe Coyle is an 82 y.o. female who was admitted 07/28/2022 for operative treatment ofPain of left femur. Patient has severe unremitting pain that affects sleep, daily activities, and work/hobbies. After pre-op clearance the patient was taken to the operating room on 07/28/2022 and underwent   Procedure(s): INTRAMEDULLARY (IM) NAIL FEMORAL.    Patient was given perioperative antibiotics:  Anti-infectives (From admission, onward)    Start     Dose/Rate Route Frequency Ordered Stop   07/28/22 1200  ceFAZolin (ANCEF) IVPB 2g/100 mL premix        2 g 200 mL/hr over 30 Minutes Intravenous On call to O.R. 07/28/22 1156 07/28/22 1500        Patient was given sequential compression devices, early ambulation, and chemoprophylaxis to prevent DVT.  Patient benefited maximally from hospital stay and there were no complications.    Recent vital signs: Patient Vitals for the past 24 hrs:  BP Temp Temp src Pulse Resp SpO2 Height Weight  07/29/22 0818 -- -- -- -- -- 98 % -- --  07/29/22 0659 95/76 97.9 F (36.6 C) Oral -- 16 97 % -- --  07/29/22 0128 114/78 97.6 F (36.4 C) Oral 74 16 -- -- --  07/28/22 2015 111/86 97.8 F (36.6 C) Oral 100 16 98 % -- --  07/28/22 1740 110/74 -- -- (!) 101 16 94 % -- --  07/28/22 1715 102/62 -- -- 66 13 93 % -- --  07/28/22 1700 96/67 -- -- 92 11 95 % -- --  07/28/22 1645 94/63 98.2 F (36.8 C) -- 76 11 95 % -- --  07/28/22 1630 100/67 -- -- 75 11  96 % -- --  07/28/22 1615 103/71 -- -- 89 13 96 % -- --  07/28/22 1600 107/87 -- -- 88 14 97 % -- --  07/28/22 1545 105/67 97.7 F (36.5 C) -- 70 16 100 % -- --  07/28/22 1218 -- -- -- -- -- -- 5' (1.524 m) 65.8 kg  07/28/22 1210 121/82 97.6 F (36.4 C) Oral 94 18 95 % 5' (1.524 m) 65.8 kg     Recent laboratory studies: No results for input(s): "WBC", "HGB", "HCT", "PLT", "NA", "K", "CL", "CO2", "BUN", "CREATININE", "GLUCOSE", "INR", "CALCIUM" in the last 72 hours.  Invalid input(s): "PT", "2"   Discharge Medications:   Allergies as of 07/29/2022       Reactions   Combigan [brimonidine Tartrate-timolol] Itching   Per patient made eyes red, sore, and sensitivity to light   Sulfa Antibiotics Hives, Rash   Sulfamethoxazole-trimethoprim Hives        Medication List     TAKE these  medications    albuterol 108 (90 Base) MCG/ACT inhaler Commonly known as: VENTOLIN HFA Inhale 2 puffs into the lungs every 6 (six) hours as needed for wheezing or shortness of breath.   Armour Thyroid 15 MG tablet Generic drug: thyroid TAKE 1 TABLET DAILY ALONG WITH 60 MG   Armour Thyroid 60 MG tablet Generic drug: thyroid TAKE 1 TABLET DAILY BEFORE BREAKFAST   atorvastatin 40 MG tablet Commonly known as: LIPITOR Take 1 tablet (40 mg total) by mouth daily before supper.   bimatoprost 0.01 % Soln Commonly known as: LUMIGAN Place 1 drop into both eyes 2 (two) times daily.   calcium-vitamin D 500-200 MG-UNIT tablet Commonly known as: OSCAL WITH D Take 1 tablet by mouth daily with breakfast.   Cerefolin 07-31-48-5 MG Tabs Take 1 tablet by mouth every morning.   dexamethasone 4 MG tablet Commonly known as: DECADRON Take 2 tablets (8 mg) by mouth daily for 3 days starting the day after chemotherapy. Take with food.   diltiazem 300 MG 24 hr capsule Commonly known as: CARDIZEM CD Take 1 capsule (300 mg total) by mouth daily.   dorzolamide-timolol 2-0.5 % ophthalmic solution Commonly known as: COSOPT Place 1 drop into both eyes at bedtime.   Eliquis 5 MG Tabs tablet Generic drug: apixaban TAKE 1 TABLET TWICE A DAY   fluticasone 50 MCG/ACT nasal spray Commonly known as: FLONASE 1 spray each nostril following sinus rinses twice daily   fluticasone-salmeterol 230-21 MCG/ACT inhaler Commonly known as: Advair HFA USE 2 INHALATIONS TWICE A DAY   furosemide 40 MG tablet Commonly known as: LASIX TAKE 1 TABLET TWICE A DAY   gabapentin 400 MG capsule Commonly known as: NEURONTIN TAKE 1 CAPSULE TWICE A DAY   levETIRAcetam 500 MG 24 hr tablet Commonly known as: KEPPRA XR Take 1 tablet (500 mg total) by mouth at bedtime.   loratadine 10 MG tablet Commonly known as: CLARITIN Take 10 mg by mouth daily.   memantine 10 MG tablet Commonly known as: Namenda Take 1 tablet (10  mg total) by mouth 2 (two) times daily.   metoprolol succinate 50 MG 24 hr tablet Commonly known as: TOPROL-XL Take 1.5 tablets (75 mg total) by mouth daily. Take with or immediately following a meal.   Multivitamin/Iron Tabs Take 1 tablet by mouth daily.   ondansetron 8 MG tablet Commonly known as: Zofran Take 1 tablet (8 mg total) by mouth every 8 (eight) hours as needed for nausea or vomiting. Start on the third  day after chemotherapy.   OVER THE COUNTER MEDICATION Take 1 tablet by mouth daily. Zyflamend otc herbal supplement   pantoprazole 40 MG tablet Commonly known as: PROTONIX TAKE 1 TABLET DAILY   potassium chloride SA 20 MEQ tablet Commonly known as: KLOR-CON M TAKE 1 TABLET TWICE A DAY   Prevagen 10 MG Caps Generic drug: Apoaequorin Take 1 capsule by mouth every morning.   prochlorperazine 10 MG tablet Commonly known as: COMPAZINE Take 1 tablet (10 mg total) by mouth every 6 (six) hours as needed for nausea or vomiting.   Rhopressa 0.02 % Soln Generic drug: Netarsudil Dimesylate Place 1 drop into both eyes at bedtime.   traMADol 50 MG tablet Commonly known as: ULTRAM Take 1 tablet (50 mg total) by mouth every 6 (six) hours as needed (post op pain). What changed: reasons to take this   Vitamin D (Ergocalciferol) 1.25 MG (50000 UNIT) Caps capsule Commonly known as: DRISDOL TAKE 1 CAPSULE WEEKLY               Durable Medical Equipment  (From admission, onward)           Start     Ordered   07/28/22 1740  DME Walker rolling  Once       Question Answer Comment  Walker: With 5 Inch Wheels   Patient needs a walker to treat with the following condition History of orthopedic surgery      07/28/22 1739            Diagnostic Studies: DG FEMUR MIN 2 VIEWS LEFT  Result Date: 07/28/2022 CLINICAL DATA:  Elective surgery. EXAM: LEFT FEMUR 2 VIEWS COMPARISON:  Radiograph 06/08/2022 FINDINGS: Eighteen fluoroscopic spot views of the left femur  obtained in the operating room. Sequential images during femoral intramedullary nail with trans trochanteric and distal locking screw fixation. Fluoroscopy time 61 seconds. Dose 4.7 mGy. IMPRESSION: Intraoperative fluoroscopy during left femoral intramedullary nail placement. Electronically Signed   By: Narda Rutherford M.D.   On: 07/28/2022 16:47   DG C-Arm 1-60 Min-No Report  Result Date: 07/28/2022 Fluoroscopy was utilized by the requesting physician.  No radiographic interpretation.   DG C-Arm 1-60 Min-No Report  Result Date: 07/28/2022 Fluoroscopy was utilized by the requesting physician.  No radiographic interpretation.   ECHOCARDIOGRAM COMPLETE  Result Date: 07/21/2022    ECHOCARDIOGRAM REPORT   Patient Name:   JOLIE BRANDOW   Date of Exam: 07/21/2022 Medical Rec #:  [id number]     Height:       60.0 in Accession #:    [id number]    Weight:       150.0 lb Date of Birth:  24-Mar-1940     BSA:          1.652 m Patient Age:    81 years      BP:           104/69 mmHg Patient Gender: F             HR:           97 bpm. Exam Location:  [address] Procedure: 2D Echo, Cardiac Doppler and Color Doppler Indications:    Z09 Chemo  History:        Patient has prior history of Echocardiogram examinations, most                 recent 01/15/2022. CHF, Stroke, Breast cancer and Mitral Valve  Disease, Arrythmias:Atrial Fibrillation, Signs/Symptoms:Lung                 cancer; Risk Factors:Hypertension and Dyslipidemia.  Sonographer:    Samule Ohm RDCS Referring Phys: [id number] PETER R ENNEVER  Sonographer Comments: Technically difficult study due to poor echo windows. IMPRESSIONS  1. Left ventricular ejection fraction, by estimation, is 60 to 65%. The left ventricle has normal function. The left ventricle has no regional wall motion abnormalities. There is mild left ventricular hypertrophy. Left ventricular diastolic parameters were normal.  2. Right ventricular systolic function is normal. The right  ventricular size is mildly enlarged.  3. Left atrial size was severely dilated.  4. Right atrial size was severely dilated.  5. The mitral valve is normal in structure. Mild to moderate mitral valve regurgitation. No evidence of mitral stenosis.  6. Tricuspid valve regurgitation is moderate to severe.  7. The aortic valve is tricuspid. Aortic valve regurgitation is mild to moderate. No aortic stenosis is present. Aortic regurgitation PHT measures 389 msec.  8. The inferior vena cava is normal in size with greater than 50% respiratory variability, suggesting right atrial pressure of 3 mmHg. FINDINGS  Left Ventricle: Left ventricular ejection fraction, by estimation, is 60 to 65%. The left ventricle has normal function. The left ventricle has no regional wall motion abnormalities. The left ventricular internal cavity size was normal in size. There is  mild left ventricular hypertrophy. Left ventricular diastolic parameters were normal. Right Ventricle: The right ventricular size is mildly enlarged. No increase in right ventricular wall thickness. Right ventricular systolic function is normal. Left Atrium: Left atrial size was severely dilated. Right Atrium: Right atrial size was severely dilated. Pericardium: There is no evidence of pericardial effusion. Mitral Valve: The mitral valve is normal in structure. Mild to moderate mitral valve regurgitation. No evidence of mitral valve stenosis. Tricuspid Valve: The tricuspid valve is normal in structure. Tricuspid valve regurgitation is moderate to severe. No evidence of tricuspid stenosis. Aortic Valve: The aortic valve is tricuspid. Aortic valve regurgitation is mild to moderate. Aortic regurgitation PHT measures 389 msec. No aortic stenosis is present. Pulmonic Valve: The pulmonic valve was normal in structure. Pulmonic valve regurgitation is trivial. No evidence of pulmonic stenosis. Aorta: The aortic root is normal in size and structure. Venous: The inferior vena cava  is normal in size with greater than 50% respiratory variability, suggesting right atrial pressure of 3 mmHg. IAS/Shunts: No atrial level shunt detected by color flow Doppler.  LEFT VENTRICLE PLAX 2D LVIDd:         3.30 cm   Diastology LVIDs:         2.40 cm   LV e' medial:    12.00 cm/s LV PW:         1.40 cm   LV E/e' medial:  11.9 LV IVS:        1.20 cm   LV e' lateral:   15.60 cm/s LVOT diam:     1.80 cm   LV E/e' lateral: 9.2 LV SV:         27 LV SV Index:   17 LVOT Area:     2.54 cm  RIGHT VENTRICLE            IVC RVSP:           44.5 mmHg  IVC diam: 0.80 cm LEFT ATRIUM              Index  RIGHT ATRIUM           Index LA diam:        5.40 cm  3.27 cm/m   RA Pressure: 3.00 mmHg LA Vol (A2C):   124.0 ml 75.07 ml/m  RA Area:     22.70 cm LA Vol (A4C):   127.0 ml 76.89 ml/m  RA Volume:   67.00 ml  40.56 ml/m LA Biplane Vol: 126.0 ml 76.28 ml/m  AORTIC VALVE LVOT Vmax:   62.94 cm/s LVOT Vmean:  43.480 cm/s LVOT VTI:    0.108 m AI PHT:      389 msec  AORTA Ao Root diam: 3.40 cm Ao Asc diam:  3.30 cm MITRAL VALVE                TRICUSPID VALVE MV Area (PHT): 4.67 cm     TR Peak grad:   41.5 mmHg MV Decel Time: 162 msec     TR Vmax:        322.00 cm/s MV E velocity: 143.20 cm/s  Estimated RAP:  3.00 mmHg                             RVSP:           44.5 mmHg                              SHUNTS                             Systemic VTI:  0.11 m                             Systemic Diam: 1.80 cm Donato Schultz MD Electronically signed by Donato Schultz MD Signature Date/Time: 07/21/2022/3:07:36 PM    Final    MM 3D SCREENING MAMMOGRAM BILATERAL BREAST  Result Date: 07/20/2022 CLINICAL DATA:  Screening. EXAM: DIGITAL SCREENING BILATERAL MAMMOGRAM WITH TOMOSYNTHESIS AND CAD TECHNIQUE: Bilateral screening digital craniocaudal and mediolateral oblique mammograms were obtained. Bilateral screening digital breast tomosynthesis was performed. The images were evaluated with computer-aided detection. COMPARISON:   Previous exam(s). ACR Breast Density Category d: The breasts are extremely dense, which lowers the sensitivity of mammography. FINDINGS: There are no findings suspicious for malignancy. IMPRESSION: No mammographic evidence of malignancy. A result letter of this screening mammogram will be mailed directly to the patient. RECOMMENDATION: Screening mammogram in one year. (Code:SM-B-01Y) BI-RADS CATEGORY  1: Negative. Electronically Signed   By: Frederico Hamman M.D.   On: 07/20/2022 10:33   DG FEMUR MIN 2 VIEWS LEFT  Result Date: 07/13/2022 CLINICAL DATA:  positive PET scan LEFT femoral; shaft. ?? any cortical disruption. EXAM: LEFT FEMUR 2 VIEWS COMPARISON:  March 31, 2022, Jul 02, 2022 FINDINGS: Revisualization of a subtle lucent lesion of the mid femoral shaft. There is focal cortical irregularity and disruption anteriorly which spans nearly the entire cortical thickness anteriorly. This likely corresponds to the PET CT findings. No acute fracture or dislocation. IMPRESSION: Revisualization of subtle lucent lesion of the anterior LEFT femur. This lesion is at risk for pathologic fracture given lytic changes involving the full depth of the anterior cortex of the femur at this location. Orthopedic surgery consultation is suggested These results will be called to the ordering clinician or representative by the Radiologist Assistant,  and communication documented in the PACS or [employer]. Electronically Signed   By: Meda Klinefelter M.D.   On: 07/13/2022 13:09   NM PET Image Restag (PS) Skull Base To Thigh  Result Date: 07/06/2022 CLINICAL DATA:  Subsequent treatment strategy for left breast cancer. EXAM: NUCLEAR MEDICINE PET SKULL BASE TO THIGH TECHNIQUE: 7.3 mCi F-18 FDG was injected intravenously. Full-ring PET imaging was performed from the skull base to thigh after the radiotracer. CT data was obtained and used for attenuation correction and anatomic localization. Fasting blood glucose: 118 mg/dl  COMPARISON:  PET-CT dated 03/26/2022 FINDINGS: Mediastinal blood pool activity: SUV max 3.5 Liver activity: SUV max NA NECK: No hypermetabolic cervical lymphadenopathy. Asymmetric hypermetabolism of the left vocal fold. No CT correlate. A similar appearance was present on the right on the prior study, suggesting that the finding is likely physiologic/spurious. Incidental CT findings: None. CHEST: Status post left breast lumpectomy with left axillary lymph node dissection. No associated hypermetabolism to suggest recurrent tumor. Post treatment changes in the left hemithorax with a small left pleural effusion, unchanged. No hypermetabolic thoracic lymphadenopathy. No hypermetabolic pulmonary nodules. Incidental CT findings: Cardiomegaly. Atherosclerotic calcifications of the aortic arch. ABDOMEN/PELVIS: No abnormal hypermetabolism in the liver, spleen, pancreas, or right adrenal gland. 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 No hypermetabolic abdominopelvic lymphadenopathy. Incidental CT findings: Atherosclerotic calcifications of the abdominal aorta and branch vessels. Sigmoid diverticulosis, without evidence of diverticulitis. SKELETON: Multifocal osseous metastases, new/progressive, including: --Left clavicular head, max SUV 5.8 --Right sacrum, max SUV 3.8 --Right acetabulum, max SUV 4.0 --Right parasymphyseal region, max SUV 4.8 --Left mid femoral shaft, max SUV 6.0 Sclerosis at L5 related to prior metastasis, non FDG avid Incidental CT findings: Degenerative changes of the visualized thoracolumbar spine. IMPRESSION: Status post left breast lumpectomy with left axillary lymph node dissection. Postsurgical changes in the left hemithorax, as above. Small left pleural effusion, unchanged. Multifocal osseous metastases, new/progressive. Electronically Signed   By: Charline Bills M.D.   On: 07/06/2022 22:09    Disposition: Discharge disposition: 01-Home or Self Care       Discharge  Instructions     Call MD / Call 911   Complete by: As directed    If you experience chest pain or shortness of breath, CALL 911 and be transported to the hospital emergency room.  If you develope a fever above 101 F, pus (white drainage) or increased drainage or redness at the wound, or calf pain, call your surgeon's office.   Constipation Prevention   Complete by: As directed    Drink plenty of fluids.  Prune juice may be helpful.  You may use a stool softener, such as Colace (over the counter) 100 mg twice a day.  Use MiraLax (over the counter) for constipation as needed.   Diet - low sodium heart healthy   Complete by: As directed    Discharge instructions   Complete by: As directed    Ice, elevate, weightbearing as tolerated.  Keep bandage clean and dry until follow up. Follow up in office 2 weeks post op.   Increase activity slowly as tolerated   Complete by: As directed    Post-operative opioid taper instructions:   Complete by: As directed    POST-OPERATIVE OPIOID TAPER INSTRUCTIONS: It is important to wean off of your opioid medication as soon as possible. If you do not need pain medication after your surgery it is ok to stop day one. Opioids include: Codeine, Hydrocodone(Norco, Vicodin), Oxycodone(Percocet,  oxycontin) and hydromorphone amongst others.  Long term and even short term use of opiods can cause: Increased pain response Dependence Constipation Depression Respiratory depression And more.  Withdrawal symptoms can include Flu like symptoms Nausea, vomiting And more Techniques to manage these symptoms Hydrate well Eat regular healthy meals Stay active Use relaxation techniques(deep breathing, meditating, yoga) Do Not substitute Alcohol to help with tapering If you have been on opioids for less than two weeks and do not have pain than it is ok to stop all together.  Plan to wean off of opioids This plan should start within one week post op of your joint  replacement. Maintain the same interval or time between taking each dose and first decrease the dose.  Cut the total daily intake of opioids by one tablet each day Next start to increase the time between doses. The last dose that should be eliminated is the evening dose.           Follow-up Information     Marcene Corning, MD. Schedule an appointment as soon as possible for a visit.   Specialty: Orthopedic Surgery Contact information: [address] [place] [address] [phone]                  Signed: Ginger Organ Thierno Hun 07/29/2022, 8:30 AM

## 2022-07-30 ENCOUNTER — Telehealth: Payer: Self-pay

## 2022-07-30 NOTE — Transitions of Care (Post Inpatient/ED Visit) (Signed)
07/30/2022  Name: Patricia [name] MRN: [id number] DOB: 01-23-1941  Today's TOC FU Call Status: Today's TOC FU Call Status:: Successful TOC FU Call Competed TOC FU Call Complete Date: 07/30/22  Transition Care Management Follow-up Telephone Call Date of Discharge: 07/29/22 Discharge Facility: [facility]) Type of Discharge: Inpatient Admission Primary Inpatient Discharge Diagnosis:: Left femur pain How have you been since you were released from the hospital?: Better Any questions or concerns?: No  Items Reviewed: Did you receive and understand the discharge instructions provided?: Yes Medications obtained,verified, and reconciled?: Yes (Medications Reviewed) Any new allergies since your discharge?: No Dietary orders reviewed?: Yes Type of Diet Ordered:: Low sodium, heart healthy Do you have support at home?: Yes People in Home: spouse Name of Support/Comfort Primary Source: Leonette Most  Medications Reviewed Today: Medications Reviewed Today     Reviewed by Jodelle Gross, RN (Case Manager) on 07/30/22 at 1328  Med List Status: <None>   Medication Order Taking? Sig Documenting Provider Last Dose Status Informant  albuterol (PROVENTIL HFA;VENTOLIN HFA) 108 (90 Base) MCG/ACT inhaler [id number] Yes Inhale 2 puffs into the lungs every 6 (six) hours as needed for wheezing or shortness of breath. [provider] Taking Active Spouse/Significant Other  apixaban (ELIQUIS) 5 MG TABS tablet [id number] Yes TAKE 1 TABLET TWICE A DAY Bhagat, Bhavinkumar, PA Taking Active Spouse/Significant Other  Apoaequorin (PREVAGEN) 10 MG CAPS [id number] Yes Take 1 capsule by mouth every morning. Carlean Jews, NP Taking Active Spouse/Significant Other  ARMOUR THYROID 15 MG tablet [id number] Yes TAKE 1 TABLET DAILY ALONG WITH 60 MG Mayer Masker, PA-C Taking Active Spouse/Significant Other           Med Note Georgann Housekeeper Jul 23, 2022  9:33 AM) Takes 75 mg total daily  ARMOUR THYROID 60 MG  tablet [id number] Yes TAKE 1 TABLET DAILY BEFORE BREAKFAST Mayer Masker, PA-C Taking Active Spouse/Significant Other           Med Note Joella Prince A   Thu Jul 23, 2022  9:33 AM) Takes 75 mg total daily  atorvastatin (LIPITOR) 40 MG tablet [id number] Yes Take 1 tablet (40 mg total) by mouth daily before supper. Quintella Reichert, MD Taking Active Spouse/Significant Other  bimatoprost (LUMIGAN) 0.01 % SOLN [id number] Yes Place 1 drop into both eyes 2 (two) times daily. [provider] Taking Active Spouse/Significant Other  calcium-vitamin D (OSCAL WITH D) 500-200 MG-UNIT tablet [id number] No Take 1 tablet by mouth daily with breakfast. Thomasene Lot, DO Unknown Active Spouse/Significant Other           Med Note Joella Prince A   Thu Jul 23, 2022  9:41 AM) On hold for procedure   dexamethasone (DECADRON) 4 MG tablet [id number] Yes Take 2 tablets (8 mg) by mouth daily for 3 days starting the day after chemotherapy. Take with food. Josph Macho, MD Taking Active Spouse/Significant Other  diltiazem (CARDIZEM CD) 300 MG 24 hr capsule [id number] Yes Take 1 capsule (300 mg total) by mouth daily. Quintella Reichert, MD Taking Active Spouse/Significant Other  dorzolamide-timolol (COSOPT) 22.3-6.8 MG/ML ophthalmic solution [id number] Yes Place 1 drop into both eyes at bedtime. [provider] Taking Active Spouse/Significant Other  fluticasone (FLONASE) 50 MCG/ACT nasal spray [id number] Yes 1 spray each nostril following sinus rinses twice daily Thomasene Lot, DO Taking Active Spouse/Significant Other  fluticasone-salmeterol (ADVAIR HFA) 230-21 MCG/ACT inhaler [id number] Yes USE 2 INHALATIONS TWICE A DAY [name], PA Taking Active Spouse/Significant Other  furosemide (LASIX) 40 MG tablet [id number] Yes TAKE 1 TABLET TWICE A DAY Turner, Traci R, MD Taking Active Spouse/Significant Other  gabapentin (NEURONTIN) 400 MG capsule [id number] Yes TAKE 1 CAPSULE TWICE A DAY Abonza, Maritza,  PA-C Taking Active Spouse/Significant Other  L-Methylfolate-B12-B6-B2 (CEREFOLIN) 07-31-48-5 MG TABS [id number] No Take 1 tablet by mouth every morning. Ihor Austin, NP Unknown Active Spouse/Significant Other           Med Note Joella Prince A   Thu Jul 23, 2022  9:45 AM) On hold for procedure   levETIRAcetam (KEPPRA XR) 500 MG 24 hr tablet [id number] Yes Take 1 tablet (500 mg total) by mouth at bedtime. Ihor Austin, NP Taking Active Spouse/Significant Other  loratadine (CLARITIN) 10 MG tablet [id number] Yes Take 10 mg by mouth daily. [provider] Taking Active Spouse/Significant Other  memantine (NAMENDA) 10 MG tablet [id number] Yes Take 1 tablet (10 mg total) by mouth 2 (two) times daily. Ihor Austin, NP Taking Active Spouse/Significant Other  metoprolol succinate (TOPROL-XL) 50 MG 24 hr tablet [id number] Yes Take 1.5 tablets (75 mg total) by mouth daily. Take with or immediately following a meal. Turner, Cornelious Bryant, MD Taking Active Spouse/Significant Other  Multiple Vitamins-Iron (MULTIVITAMIN/IRON) TABS [id number] Yes Take 1 tablet by mouth daily. [provider] Taking Active Spouse/Significant Other  Netarsudil Dimesylate (RHOPRESSA) 0.02 % SOLN [id number] Yes Place 1 drop into both eyes at bedtime. [provider] Taking Active Spouse/Significant Other  ondansetron (ZOFRAN) 8 MG tablet [id number] Yes Take 1 tablet (8 mg total) by mouth every 8 (eight) hours as needed for nausea or vomiting. Start on the third day after chemotherapy. Josph Macho, MD Taking Active Spouse/Significant Other  OVER THE COUNTER MEDICATION [id number] No Take 1 tablet by mouth daily. Zyflamend otc herbal supplement [provider] Unknown Active Spouse/Significant Other  pantoprazole (PROTONIX) 40 MG tablet [id number] Yes TAKE 1 TABLET DAILY Boscia, Heather E, NP Taking Active Spouse/Significant Other  potassium chloride SA (KLOR-CON M) 20 MEQ tablet [id number] Yes TAKE 1 TABLET TWICE  A DAY Turner, Cornelious Bryant, MD Taking Active Spouse/Significant Other  prochlorperazine (COMPAZINE) 10 MG tablet [id number] Yes Take 1 tablet (10 mg total) by mouth every 6 (six) hours as needed for nausea or vomiting. Josph Macho, MD Taking Active Spouse/Significant Other  traMADol (ULTRAM) 50 MG tablet [id number] Yes Take 1 tablet (50 mg total) by mouth every 6 (six) hours as needed (post op pain). Elodia Florence, PA-C Taking Active   Vitamin D, Ergocalciferol, (DRISDOL) 1.25 MG (50000 UNIT) CAPS capsule [id number] No TAKE 1 CAPSULE WEEKLY Carlean Jews, NP Unknown Active Spouse/Significant Other           Med Note Georgann Housekeeper Jul 23, 2022  9:44 AM) Leanora Ivanoff on Sundays. On hold for procedure             Home Care and Equipment/Supplies: Were Home Health Services Ordered?: No Any new equipment or medical supplies ordered?: Yes Name of Medical supply agency?: [employer] Were you able to get the equipment/medical supplies?: Yes Do you have any questions related to the use of the equipment/supplies?: No  Functional Questionnaire: Do you need assistance with bathing/showering or dressing?: Yes Do you need assistance with meal preparation?: No Do you need assistance with eating?: No Do you have difficulty maintaining continence: No Do you need assistance with getting out of bed/getting out of a chair/moving?: No Do you have difficulty managing or taking your medications?: No  Follow up  appointments reviewed: PCP Follow-up appointment confirmed?: NA Specialist Hospital Follow-up appointment confirmed?: Yes Date of Specialist follow-up appointment?:  (Patient has follow up appointment but her spouse has the date, she is not sure on the date.) Follow-Up Specialty Provider:: Dr. Jerl Santos Do you need transportation to your follow-up appointment?: No Do you understand care options if your condition(s) worsen?: Yes-patient verbalized understanding  SDOH Interventions Today    Flowsheet  Row Most Recent Value  SDOH Interventions   Food Insecurity Interventions Intervention Not Indicated  Housing Interventions Intervention Not Indicated  Transportation Interventions Intervention Not Indicated      Jodelle Gross, RN, BSN, CCM Care Management Coordinator [facility] Phone: [phone]/Fax: [phone]

## 2022-08-02 ENCOUNTER — Encounter: Payer: Self-pay | Admitting: Cardiology

## 2022-08-02 DIAGNOSIS — I4819 Other persistent atrial fibrillation: Secondary | ICD-10-CM

## 2022-08-03 MED ORDER — APIXABAN 5 MG PO TABS
5.0000 mg | ORAL_TABLET | Freq: Two times a day (BID) | ORAL | 3 refills | Status: DC
Start: 2022-08-03 — End: 2023-07-28

## 2022-08-07 ENCOUNTER — Encounter: Payer: Self-pay | Admitting: Hematology & Oncology

## 2022-08-07 DIAGNOSIS — C50919 Malignant neoplasm of unspecified site of unspecified female breast: Secondary | ICD-10-CM | POA: Diagnosis not present

## 2022-08-07 DIAGNOSIS — C7951 Secondary malignant neoplasm of bone: Secondary | ICD-10-CM | POA: Diagnosis not present

## 2022-08-07 DIAGNOSIS — Z09 Encounter for follow-up examination after completed treatment for conditions other than malignant neoplasm: Secondary | ICD-10-CM | POA: Diagnosis not present

## 2022-08-10 ENCOUNTER — Encounter: Payer: Self-pay | Admitting: *Deleted

## 2022-08-10 ENCOUNTER — Inpatient Hospital Stay (HOSPITAL_BASED_OUTPATIENT_CLINIC_OR_DEPARTMENT_OTHER): Payer: Medicare Other | Admitting: Medical Oncology

## 2022-08-10 ENCOUNTER — Encounter: Payer: Self-pay | Admitting: Hematology & Oncology

## 2022-08-10 ENCOUNTER — Other Ambulatory Visit: Payer: Medicare Other

## 2022-08-10 ENCOUNTER — Inpatient Hospital Stay: Payer: Medicare Other | Attending: Hematology & Oncology

## 2022-08-10 ENCOUNTER — Ambulatory Visit: Payer: Medicare Other | Admitting: Hematology & Oncology

## 2022-08-10 ENCOUNTER — Inpatient Hospital Stay: Payer: Medicare Other

## 2022-08-10 ENCOUNTER — Inpatient Hospital Stay: Payer: Medicare Other | Admitting: Hematology & Oncology

## 2022-08-10 ENCOUNTER — Other Ambulatory Visit: Payer: Self-pay | Admitting: Medical Oncology

## 2022-08-10 ENCOUNTER — Ambulatory Visit (HOSPITAL_BASED_OUTPATIENT_CLINIC_OR_DEPARTMENT_OTHER)
Admission: RE | Admit: 2022-08-10 | Discharge: 2022-08-10 | Disposition: A | Discharge status: Home / Self Care | Payer: Medicare Other | Source: Ambulatory Visit | Attending: Medical Oncology | Admitting: Medical Oncology

## 2022-08-10 VITALS — BP 100/66 | HR 89 | Temp 97.9°F | Resp 17 | Ht 60.0 in | Wt 152.8 lb

## 2022-08-10 DIAGNOSIS — Z4889 Encounter for other specified surgical aftercare: Secondary | ICD-10-CM | POA: Diagnosis not present

## 2022-08-10 DIAGNOSIS — R600 Localized edema: Secondary | ICD-10-CM

## 2022-08-10 DIAGNOSIS — K5730 Diverticulosis of large intestine without perforation or abscess without bleeding: Secondary | ICD-10-CM | POA: Diagnosis not present

## 2022-08-10 DIAGNOSIS — E878 Other disorders of electrolyte and fluid balance, not elsewhere classified: Secondary | ICD-10-CM | POA: Diagnosis not present

## 2022-08-10 DIAGNOSIS — I878 Other specified disorders of veins: Secondary | ICD-10-CM | POA: Diagnosis not present

## 2022-08-10 DIAGNOSIS — D649 Anemia, unspecified: Secondary | ICD-10-CM | POA: Diagnosis not present

## 2022-08-10 DIAGNOSIS — C50412 Malignant neoplasm of upper-outer quadrant of left female breast: Secondary | ICD-10-CM

## 2022-08-10 DIAGNOSIS — C7951 Secondary malignant neoplasm of bone: Secondary | ICD-10-CM

## 2022-08-10 LAB — CBC WITH DIFFERENTIAL (CANCER CENTER ONLY)
Abs Immature Granulocytes: 0.47 10*3/uL — ABNORMAL HIGH (ref 0.00–0.07)
Basophils Absolute: 0 10*3/uL (ref 0.0–0.1)
Basophils Relative: 1 %
Eosinophils Absolute: 0 10*3/uL (ref 0.0–0.5)
Eosinophils Relative: 0 %
HCT: 30.3 % — ABNORMAL LOW (ref 36.0–46.0)
Hemoglobin: 9.6 g/dL — ABNORMAL LOW (ref 12.0–15.0)
Immature Granulocytes: 9 %
Lymphocytes Relative: 17 %
Lymphs Abs: 0.9 10*3/uL (ref 0.7–4.0)
MCH: 32.5 pg (ref 26.0–34.0)
MCHC: 31.7 g/dL (ref 30.0–36.0)
MCV: 102.7 fL — ABNORMAL HIGH (ref 80.0–100.0)
Monocytes Absolute: 0.6 10*3/uL (ref 0.1–1.0)
Monocytes Relative: 12 %
Neutro Abs: 3.1 10*3/uL (ref 1.7–7.7)
Neutrophils Relative %: 61 %
Platelet Count: 307 10*3/uL (ref 150–400)
RBC: 2.95 MIL/uL — ABNORMAL LOW (ref 3.87–5.11)
RDW: 18 % — ABNORMAL HIGH (ref 11.5–15.5)
Smear Review: NORMAL
WBC Count: 5.1 10*3/uL (ref 4.0–10.5)
nRBC: 2.1 % — ABNORMAL HIGH (ref 0.0–0.2)

## 2022-08-10 LAB — CMP (CANCER CENTER ONLY)
ALT: 34 U/L (ref 0–44)
AST: 26 U/L (ref 15–41)
Albumin: 3.3 g/dL — ABNORMAL LOW (ref 3.5–5.0)
Alkaline Phosphatase: 80 U/L (ref 38–126)
Anion gap: 9 (ref 5–15)
BUN: 13 mg/dL (ref 8–23)
CO2: 21 mmol/L — ABNORMAL LOW (ref 22–32)
Calcium: 7.7 mg/dL — ABNORMAL LOW (ref 8.9–10.3)
Chloride: 108 mmol/L (ref 98–111)
Creatinine: 1.1 mg/dL — ABNORMAL HIGH (ref 0.44–1.00)
GFR, Estimated: 50 mL/min — ABNORMAL LOW (ref >60)
Glucose, Bld: 109 mg/dL — ABNORMAL HIGH (ref 70–99)
Potassium: 3.9 mmol/L (ref 3.5–5.1)
Sodium: 138 mmol/L (ref 135–145)
Total Bilirubin: 0.7 mg/dL (ref 0.3–1.2)
Total Protein: 5.6 g/dL — ABNORMAL LOW (ref 6.5–8.1)

## 2022-08-10 LAB — LACTATE DEHYDROGENASE: LDH: 345 U/L — ABNORMAL HIGH (ref 98–192)

## 2022-08-10 MED ORDER — IOHEXOL 350 MG/ML SOLN
125.0000 mL | Freq: Once | INTRAVENOUS | Status: AC | PRN
  Administered 2022-08-10: 125 mL via INTRAVENOUS

## 2022-08-10 NOTE — Progress Notes (Signed)
Hematology and Oncology Follow Up Visit  Patricia [name] [id number] 1940/08/10 82 y.o. 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 Now on treatment with Enhertu.   She did have a PET scan completed on 07/04/2022 that showed increased osseous metastasis. There are 5 new areas of disease.  Her tumor is HER2 positive (2+).   She was started on Enhertu on 07/22/2022 which she reports went well.  Since her last visit she had an intramedullary nail of her femur placed given a lytic lesions being present and her at risk of spontaneous fracture. They report left leg swelling since this time. No calf pain, new SOB or chest pain  She feels well otherwise.  Her last CA 27.29 was 47.7 which is higher than it had been. Previously 34.3 2 months ago and 19 4 months ago.   She has had no change in bowel or bladder habits.  She is eating well.  There is no bleeding.  She has had no headache. No SOB, cough, edema, dizziness, fever.   Overall, I would have said that her performance status is probably ECOG 1.   Wt Readings from Last 3 Encounters:  08/10/22 152 lb 12.8 oz (69.3 kg)  07/28/22 145 lb (65.8 kg)  07/24/22 145 lb (65.8 kg)     Medications:  Current Outpatient Medications:    albuterol (PROVENTIL HFA;VENTOLIN HFA) 108 (90 Base) MCG/ACT inhaler, Inhale 2 puffs into the lungs every 6 (six) hours as needed for wheezing or shortness of breath., Disp: , Rfl:    apixaban (ELIQUIS) 5 MG TABS tablet, Take 1 tablet (5 mg total) by mouth 2 (two) times daily.,  Disp: 180 tablet, Rfl: 3   Apoaequorin (PREVAGEN) 10 MG CAPS, Take 1 capsule by mouth every morning., Disp: 90 capsule, Rfl: 3   ARMOUR THYROID 15 MG tablet, TAKE 1 TABLET DAILY ALONG WITH 60 MG, Disp: 90 tablet, Rfl: 3   ARMOUR THYROID 60 MG tablet, TAKE 1 TABLET DAILY BEFORE BREAKFAST, Disp: 90 tablet, Rfl: 3   atorvastatin (LIPITOR) 40 MG tablet, Take 1 tablet (40 mg total) by mouth daily before supper., Disp: 90 tablet, Rfl: 3   bimatoprost (LUMIGAN) 0.01 % SOLN, Place 1 drop into both eyes 2 (two) times daily., Disp: , Rfl:    calcium-vitamin D (OSCAL WITH D) 500-200 MG-UNIT tablet, Take 1 tablet by mouth daily with breakfast., Disp: 30 tablet, Rfl: 0   dexamethasone (DECADRON) 4 MG tablet, Take 2 tablets (8 mg) by mouth daily for 3 days starting the day after chemotherapy. Take with food., Disp: 30 tablet, Rfl: 1   diltiazem (CARDIZEM CD) 300 MG 24 hr capsule, Take 1 capsule (300 mg total) by mouth daily., Disp: 90 capsule, Rfl: 3   dorzolamide-timolol (COSOPT) 22.3-6.8 MG/ML ophthalmic solution, Place 1 drop into both eyes at bedtime., Disp: , Rfl:    fluticasone (FLONASE) 50 MCG/ACT nasal spray, 1 spray each nostril following sinus rinses twice daily, Disp: 16 g, Rfl: 2   fluticasone-salmeterol (ADVAIR HFA) 230-21 MCG/ACT inhaler, USE 2 INHALATIONS TWICE A DAY, Disp:  36 g, Rfl: 3   furosemide (LASIX) 40 MG tablet, TAKE 1 TABLET TWICE A DAY, Disp: 180 tablet, Rfl: 3   gabapentin (NEURONTIN) 400 MG capsule, TAKE 1 CAPSULE TWICE A DAY, Disp: 180 capsule, Rfl: 3   L-Methylfolate-B12-B6-B2 (CEREFOLIN) 07-31-48-5 MG TABS, Take 1 tablet by mouth every morning., Disp: 90 tablet, Rfl: 3   levETIRAcetam (KEPPRA XR) 500 MG 24 hr tablet, Take 1 tablet (500 mg total) by mouth at bedtime., Disp: 90 tablet, Rfl: 3   loratadine (CLARITIN) 10 MG tablet, Take 10 mg by mouth daily., Disp: , Rfl:    memantine (NAMENDA) 10 MG tablet, Take 1 tablet (10 mg total) by mouth 2 (two) times daily., Disp: 180 tablet, Rfl:  3   metoprolol succinate (TOPROL-XL) 50 MG 24 hr tablet, Take 1.5 tablets (75 mg total) by mouth daily. Take with or immediately following a meal., Disp: 135 tablet, Rfl: 2   Multiple Vitamins-Iron (MULTIVITAMIN/IRON) TABS, Take 1 tablet by mouth daily., Disp: , Rfl:    Netarsudil Dimesylate (RHOPRESSA) 0.02 % SOLN, Place 1 drop into both eyes at bedtime., Disp: , Rfl:    ondansetron (ZOFRAN) 8 MG tablet, Take 1 tablet (8 mg total) by mouth every 8 (eight) hours as needed for nausea or vomiting. Start on the third day after chemotherapy., Disp: 30 tablet, Rfl: 1   OVER THE COUNTER MEDICATION, Take 1 tablet by mouth daily. Zyflamend otc herbal supplement, Disp: , Rfl:    pantoprazole (PROTONIX) 40 MG tablet, TAKE 1 TABLET DAILY, Disp: 90 tablet, Rfl: 3   potassium chloride SA (KLOR-CON M) 20 MEQ tablet, TAKE 1 TABLET TWICE A DAY, Disp: 180 tablet, Rfl: 3   prochlorperazine (COMPAZINE) 10 MG tablet, Take 1 tablet (10 mg total) by mouth every 6 (six) hours as needed for nausea or vomiting., Disp: 30 tablet, Rfl: 1   traMADol (ULTRAM) 50 MG tablet, Take 1 tablet (50 mg total) by mouth every 6 (six) hours as needed (post op pain)., Disp: 20 tablet, Rfl: 0   Vitamin D, Ergocalciferol, (DRISDOL) 1.25 MG (50000 UNIT) CAPS capsule, TAKE 1 CAPSULE WEEKLY, Disp: 12 capsule, Rfl: 3 No current facility-administered medications for this visit.  Facility-Administered Medications Ordered in Other Visits:    acetaminophen (TYLENOL) tablet 650 mg, 650 mg, Oral, Once, Ennever, Rose Phi, MD   dexamethasone (DECADRON) 10 mg in sodium chloride 0.9 % 50 mL IVPB, 10 mg, Intravenous, Once, Ennever, Rose Phi, MD   dextrose 5 % solution, , Intravenous, Once, Ennever, Rose Phi, MD   diphenhydrAMINE (BENADRYL) capsule 50 mg, 50 mg, Oral, Once, Ennever, Rose Phi, MD   fosaprepitant (EMEND) 150 mg in sodium chloride 0.9 % 145 mL IVPB, 150 mg, Intravenous, Once, Ennever, Rose Phi, MD   palonosetron (ALOXI) injection 0.25 mg, 0.25 mg,  Intravenous, Once, Ennever, Rose Phi, MD  Allergies:  Allergies  Allergen Reactions   Combigan [Brimonidine Tartrate-Timolol] Itching    Per patient made eyes red, sore, and sensitivity to light   Sulfa Antibiotics Hives and Rash   Sulfamethoxazole-Trimethoprim Hives    Past Medical History, Surgical history, Social history, and Family History were reviewed and updated.  Review of Systems: Review of Systems  Constitutional: Negative.   HENT:  Negative.    Eyes: Negative.   Respiratory: Negative.    Cardiovascular: Negative.   Gastrointestinal: Negative.   Endocrine: Negative.   Genitourinary: Negative.    Musculoskeletal: Negative.   Skin: Negative.   Neurological: Negative.   Hematological: Negative.   Psychiatric/Behavioral:  Negative.      Physical Exam:  height is 5' (1.524 m) and weight is 152 lb 12.8 oz (69.3 kg). Her oral temperature is 97.9 F (36.6 C). Her blood pressure is 100/66 and her pulse is 89. Her respiration is 17 and oxygen saturation is 92%.   Wt Readings from Last 3 Encounters:  08/10/22 152 lb 12.8 oz (69.3 kg)  07/28/22 145 lb (65.8 kg)  07/24/22 145 lb (65.8 kg)    Physical Exam Vitals reviewed.  HENT:     Head: Normocephalic and atraumatic.  Eyes:     Pupils: Pupils are equal, round, and reactive to light.  Cardiovascular:     Rate and Rhythm: Normal rate and regular rhythm.     Heart sounds: Normal heart sounds.  Pulmonary:     Effort: Pulmonary effort is normal.     Breath sounds: Normal breath sounds.  Abdominal:     General: Bowel sounds are normal.     Palpations: Abdomen is soft.  Musculoskeletal:        General: No tenderness or deformity. Normal range of motion.     Cervical back: Normal range of motion.     Left lower leg: Edema present.     Comments: There is 2+ edema of the left leg throughout. The leg is mildly cooler to touch than the right. 1+ DP of the left leg. No cyanosis of skin. No active bleeding.    Lymphadenopathy:     Cervical: No cervical adenopathy.  Skin:    General: Skin is warm and dry.     Findings: No erythema or rash.  Neurological:     Mental Status: She is alert and oriented to person, place, and time.  Psychiatric:        Behavior: Behavior normal.        Thought Content: Thought content normal.        Judgment: Judgment normal.      Lab Results  Component Value Date   WBC 5.1 08/10/2022   HGB 9.6 (L) 08/10/2022   HCT 30.3 (L) 08/10/2022   MCV 102.7 (H) 08/10/2022   PLT 307 08/10/2022     Chemistry      Component Value Date/Time   NA 138 08/10/2022 1052   NA 143 10/09/2021 1056   NA 141 09/22/2016 1459   K 3.9 08/10/2022 1052   K 3.8 09/22/2016 1459   CL 108 08/10/2022 1052   CO2 21 (L) 08/10/2022 1052   CO2 26 09/22/2016 1459   BUN 13 08/10/2022 1052   BUN 17 10/09/2021 1056   BUN 19.8 09/22/2016 1459   CREATININE 1.10 (H) 08/10/2022 1052   CREATININE 1.1 09/22/2016 1459      Component Value Date/Time   CALCIUM 7.7 (L) 08/10/2022 1052   CALCIUM 9.2 09/22/2016 1459   ALKPHOS 80 08/10/2022 1052   ALKPHOS 98 09/22/2016 1459   AST 26 08/10/2022 1052   AST 15 09/22/2016 1459   ALT 34 08/10/2022 1052   ALT 21 09/22/2016 1459   BILITOT 0.7 08/10/2022 1052   BILITOT 0.65 09/22/2016 1459      Impression and Plan: Ms. Padelford is a very nice 82 year old postmenopausal white female.  She has a solitary metastatic focus from breast cancer.  Her initial breast cancer was probably 7 years ago.  It was estrogen positive. She now has what Dr. 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  The HER2 was equivocal on initial staining but on FISH was  negative.  Has started on Enhertu with goals of avoiding chemotherapy per patient request. She reports that she has tolerated her first cycle of Enhertu well. Plan is to complete 3 or 4 cycles of treatment and then repeat her scan. We will continue to monitor her CA 27.29. Today on review of her labs  there is concern about her drop in hemoglobin from 12.8 to 9.6. She did just recent have surgery to her femur. There is bruising and she is on a blood thinner for chronic A. Fib. I have discussed my concerns with Dr. Myna Hidalgo and we elected to obtain a CTA of the leg to ensure no bleeding of clotting (less likely due to Eliquis use). We will HOLD her Enhertu today. We will recheck labs including nutritional labs on Thursday to see if she would benefit from iron/B12. She will start a folic acid supplement. She will increase her calcium intake and will drink one-2 ensures daily. She will continue her daily multivitamin. Nutritional consult also submitted. Referral for social work to discuss home health aid also discussed per patient request.   Disposition STAT CT orders placed Holding Enhertu/xgeva today RTC Thursday me, labs (CBC w/, CMP, iron, ferritin, retic, B12, folate) +- xgeva RTC Monday MD/APP, labs ( CBC w/, CMP, CA 27.29, LDH) + Enhertu-[place]   Rushie Chestnut, PA-C 6/10/202412:23 PM

## 2022-08-10 NOTE — Progress Notes (Signed)
Patient here today to continue treatment. She had her surgery on 07/28/22. Due to post operative drop in hgb, bruising to leg and concern for possible bleed/clot. She will have a CTA today and return to clinic on Thursday for reconsideration of treatment.   Oncology Nurse Navigator Documentation     08/10/2022   11:30 AM  Oncology Nurse Navigator Flowsheets  Navigator Follow Up Date: 08/13/2022  Navigator Follow Up Reason: Follow-up Appointment;Chemotherapy  Navigator Location [facility]  Navigator Encounter Type Appt/Treatment Plan Review  Patient Visit Type MedOnc  Treatment Phase Active Tx  Barriers/Navigation Needs Coordination of Care  Interventions None Required  Acuity Level 1-No Barriers  Support Groups/Services Friends and Family  Time Spent with Patient 15

## 2022-08-11 ENCOUNTER — Telehealth: Payer: Self-pay

## 2022-08-11 LAB — CANCER ANTIGEN 27.29: CA 27.29: 34.4 U/mL (ref 0.0–38.6)

## 2022-08-11 NOTE — Telephone Encounter (Signed)
Per the request of Clent Jacks, NP, CSW attempted to contact patient to assess needs.  Left vm.

## 2022-08-13 ENCOUNTER — Inpatient Hospital Stay: Payer: Medicare Other

## 2022-08-13 ENCOUNTER — Encounter (HOSPITAL_BASED_OUTPATIENT_CLINIC_OR_DEPARTMENT_OTHER): Payer: Self-pay | Admitting: Urology

## 2022-08-13 ENCOUNTER — Other Ambulatory Visit: Payer: Self-pay

## 2022-08-13 ENCOUNTER — Emergency Department (HOSPITAL_BASED_OUTPATIENT_CLINIC_OR_DEPARTMENT_OTHER): Payer: Medicare Other

## 2022-08-13 ENCOUNTER — Telehealth: Payer: Self-pay

## 2022-08-13 ENCOUNTER — Ambulatory Visit: Payer: Medicare Other | Admitting: Dietician

## 2022-08-13 ENCOUNTER — Encounter: Payer: Self-pay | Admitting: *Deleted

## 2022-08-13 ENCOUNTER — Inpatient Hospital Stay (HOSPITAL_BASED_OUTPATIENT_CLINIC_OR_DEPARTMENT_OTHER)
Admission: EM | Admit: 2022-08-13 | Discharge: 2022-08-16 | DRG: 308 | Disposition: A | Discharge status: Home Health | Payer: Medicare Other | Source: Ambulatory Visit | Attending: Internal Medicine | Admitting: Internal Medicine

## 2022-08-13 ENCOUNTER — Encounter: Payer: Self-pay | Admitting: Medical Oncology

## 2022-08-13 ENCOUNTER — Inpatient Hospital Stay (HOSPITAL_BASED_OUTPATIENT_CLINIC_OR_DEPARTMENT_OTHER): Payer: Medicare Other | Admitting: Medical Oncology

## 2022-08-13 VITALS — BP 98/68 | HR 100 | Temp 97.8°F | Resp 20 | Ht 60.0 in

## 2022-08-13 DIAGNOSIS — I4821 Permanent atrial fibrillation: Secondary | ICD-10-CM | POA: Diagnosis not present

## 2022-08-13 DIAGNOSIS — C7951 Secondary malignant neoplasm of bone: Secondary | ICD-10-CM | POA: Diagnosis not present

## 2022-08-13 DIAGNOSIS — I272 Pulmonary hypertension, unspecified: Secondary | ICD-10-CM | POA: Diagnosis present

## 2022-08-13 DIAGNOSIS — Z171 Estrogen receptor negative status [ER-]: Secondary | ICD-10-CM | POA: Diagnosis not present

## 2022-08-13 DIAGNOSIS — G4733 Obstructive sleep apnea (adult) (pediatric): Secondary | ICD-10-CM | POA: Diagnosis present

## 2022-08-13 DIAGNOSIS — Z66 Do not resuscitate: Secondary | ICD-10-CM | POA: Diagnosis not present

## 2022-08-13 DIAGNOSIS — Z1501 Genetic susceptibility to malignant neoplasm of breast: Secondary | ICD-10-CM

## 2022-08-13 DIAGNOSIS — E039 Hypothyroidism, unspecified: Secondary | ICD-10-CM | POA: Diagnosis present

## 2022-08-13 DIAGNOSIS — Z882 Allergy status to sulfonamides status: Secondary | ICD-10-CM

## 2022-08-13 DIAGNOSIS — J9811 Atelectasis: Secondary | ICD-10-CM | POA: Diagnosis present

## 2022-08-13 DIAGNOSIS — Z170 Estrogen receptor positive status [ER+]: Secondary | ICD-10-CM

## 2022-08-13 DIAGNOSIS — Z87891 Personal history of nicotine dependence: Secondary | ICD-10-CM

## 2022-08-13 DIAGNOSIS — Z7189 Other specified counseling: Secondary | ICD-10-CM | POA: Diagnosis not present

## 2022-08-13 DIAGNOSIS — Z7901 Long term (current) use of anticoagulants: Secondary | ICD-10-CM

## 2022-08-13 DIAGNOSIS — R0902 Hypoxemia: Secondary | ICD-10-CM | POA: Diagnosis not present

## 2022-08-13 DIAGNOSIS — D6489 Other specified anemias: Secondary | ICD-10-CM | POA: Diagnosis present

## 2022-08-13 DIAGNOSIS — Z79890 Hormone replacement therapy (postmenopausal): Secondary | ICD-10-CM

## 2022-08-13 DIAGNOSIS — Z888 Allergy status to other drugs, medicaments and biological substances status: Secondary | ICD-10-CM

## 2022-08-13 DIAGNOSIS — R918 Other nonspecific abnormal finding of lung field: Secondary | ICD-10-CM | POA: Diagnosis not present

## 2022-08-13 DIAGNOSIS — J168 Pneumonia due to other specified infectious organisms: Secondary | ICD-10-CM | POA: Diagnosis not present

## 2022-08-13 DIAGNOSIS — I952 Hypotension due to drugs: Secondary | ICD-10-CM | POA: Diagnosis not present

## 2022-08-13 DIAGNOSIS — Z515 Encounter for palliative care: Secondary | ICD-10-CM

## 2022-08-13 DIAGNOSIS — J189 Pneumonia, unspecified organism: Secondary | ICD-10-CM | POA: Diagnosis present

## 2022-08-13 DIAGNOSIS — I083 Combined rheumatic disorders of mitral, aortic and tricuspid valves: Secondary | ICD-10-CM | POA: Diagnosis present

## 2022-08-13 DIAGNOSIS — R42 Dizziness and giddiness: Secondary | ICD-10-CM

## 2022-08-13 DIAGNOSIS — J90 Pleural effusion, not elsewhere classified: Secondary | ICD-10-CM

## 2022-08-13 DIAGNOSIS — Z881 Allergy status to other antibiotic agents status: Secondary | ICD-10-CM

## 2022-08-13 DIAGNOSIS — Z923 Personal history of irradiation: Secondary | ICD-10-CM

## 2022-08-13 DIAGNOSIS — Z853 Personal history of malignant neoplasm of breast: Secondary | ICD-10-CM

## 2022-08-13 DIAGNOSIS — I130 Hypertensive heart and chronic kidney disease with heart failure and stage 1 through stage 4 chronic kidney disease, or unspecified chronic kidney disease: Secondary | ICD-10-CM | POA: Diagnosis present

## 2022-08-13 DIAGNOSIS — I10 Essential (primary) hypertension: Secondary | ICD-10-CM | POA: Diagnosis present

## 2022-08-13 DIAGNOSIS — I429 Cardiomyopathy, unspecified: Secondary | ICD-10-CM | POA: Diagnosis present

## 2022-08-13 DIAGNOSIS — I4891 Unspecified atrial fibrillation: Secondary | ICD-10-CM | POA: Diagnosis not present

## 2022-08-13 DIAGNOSIS — N183 Chronic kidney disease, stage 3 unspecified: Secondary | ICD-10-CM | POA: Diagnosis present

## 2022-08-13 DIAGNOSIS — I4819 Other persistent atrial fibrillation: Secondary | ICD-10-CM | POA: Diagnosis not present

## 2022-08-13 DIAGNOSIS — E032 Hypothyroidism due to medicaments and other exogenous substances: Secondary | ICD-10-CM

## 2022-08-13 DIAGNOSIS — Z85118 Personal history of other malignant neoplasm of bronchus and lung: Secondary | ICD-10-CM

## 2022-08-13 DIAGNOSIS — F0390 Unspecified dementia without behavioral disturbance: Secondary | ICD-10-CM | POA: Diagnosis present

## 2022-08-13 DIAGNOSIS — N1831 Chronic kidney disease, stage 3a: Secondary | ICD-10-CM | POA: Diagnosis not present

## 2022-08-13 DIAGNOSIS — M8580 Other specified disorders of bone density and structure, unspecified site: Secondary | ICD-10-CM | POA: Diagnosis present

## 2022-08-13 DIAGNOSIS — T461X5A Adverse effect of calcium-channel blockers, initial encounter: Secondary | ICD-10-CM | POA: Diagnosis not present

## 2022-08-13 DIAGNOSIS — J45909 Unspecified asthma, uncomplicated: Secondary | ICD-10-CM | POA: Diagnosis present

## 2022-08-13 DIAGNOSIS — Z8249 Family history of ischemic heart disease and other diseases of the circulatory system: Secondary | ICD-10-CM

## 2022-08-13 DIAGNOSIS — R7303 Prediabetes: Secondary | ICD-10-CM | POA: Diagnosis present

## 2022-08-13 DIAGNOSIS — C50919 Malignant neoplasm of unspecified site of unspecified female breast: Secondary | ICD-10-CM

## 2022-08-13 DIAGNOSIS — C50412 Malignant neoplasm of upper-outer quadrant of left female breast: Secondary | ICD-10-CM

## 2022-08-13 DIAGNOSIS — E876 Hypokalemia: Secondary | ICD-10-CM | POA: Diagnosis not present

## 2022-08-13 DIAGNOSIS — I5033 Acute on chronic diastolic (congestive) heart failure: Secondary | ICD-10-CM | POA: Diagnosis not present

## 2022-08-13 DIAGNOSIS — Z79899 Other long term (current) drug therapy: Secondary | ICD-10-CM

## 2022-08-13 DIAGNOSIS — Z83438 Family history of other disorder of lipoprotein metabolism and other lipidemia: Secondary | ICD-10-CM

## 2022-08-13 DIAGNOSIS — E049 Nontoxic goiter, unspecified: Secondary | ICD-10-CM | POA: Diagnosis present

## 2022-08-13 DIAGNOSIS — E785 Hyperlipidemia, unspecified: Secondary | ICD-10-CM | POA: Diagnosis present

## 2022-08-13 DIAGNOSIS — Z7952 Long term (current) use of systemic steroids: Secondary | ICD-10-CM

## 2022-08-13 DIAGNOSIS — D649 Anemia, unspecified: Secondary | ICD-10-CM

## 2022-08-13 DIAGNOSIS — Z79620 Long term (current) use of immunosuppressive biologic: Secondary | ICD-10-CM

## 2022-08-13 DIAGNOSIS — Z823 Family history of stroke: Secondary | ICD-10-CM

## 2022-08-13 DIAGNOSIS — Z8673 Personal history of transient ischemic attack (TIA), and cerebral infarction without residual deficits: Secondary | ICD-10-CM

## 2022-08-13 LAB — URINALYSIS, ROUTINE W REFLEX MICROSCOPIC
Bilirubin Urine: NEGATIVE
Glucose, UA: NEGATIVE mg/dL
Hgb urine dipstick: NEGATIVE
Ketones, ur: NEGATIVE mg/dL
Nitrite: NEGATIVE
Protein, ur: NEGATIVE mg/dL
Specific Gravity, Urine: 1.015 (ref 1.005–1.030)
pH: 7 (ref 5.0–8.0)

## 2022-08-13 LAB — CBC WITH DIFFERENTIAL (CANCER CENTER ONLY)
Abs Immature Granulocytes: 0.21 10*3/uL — ABNORMAL HIGH (ref 0.00–0.07)
Basophils Absolute: 0 10*3/uL (ref 0.0–0.1)
Basophils Relative: 0 %
Eosinophils Absolute: 0 10*3/uL (ref 0.0–0.5)
Eosinophils Relative: 0 %
HCT: 30.4 % — ABNORMAL LOW (ref 36.0–46.0)
Hemoglobin: 9.8 g/dL — ABNORMAL LOW (ref 12.0–15.0)
Immature Granulocytes: 3 %
Lymphocytes Relative: 11 %
Lymphs Abs: 0.7 10*3/uL (ref 0.7–4.0)
MCH: 33.4 pg (ref 26.0–34.0)
MCHC: 32.2 g/dL (ref 30.0–36.0)
MCV: 103.8 fL — ABNORMAL HIGH (ref 80.0–100.0)
Monocytes Absolute: 0.7 10*3/uL (ref 0.1–1.0)
Monocytes Relative: 10 %
Neutro Abs: 4.9 10*3/uL (ref 1.7–7.7)
Neutrophils Relative %: 76 %
Platelet Count: 315 10*3/uL (ref 150–400)
RBC: 2.93 MIL/uL — ABNORMAL LOW (ref 3.87–5.11)
RDW: 20.4 % — ABNORMAL HIGH (ref 11.5–15.5)
WBC Count: 6.5 10*3/uL (ref 4.0–10.5)
nRBC: 0.6 % — ABNORMAL HIGH (ref 0.0–0.2)

## 2022-08-13 LAB — TSH: TSH: 5.612 u[IU]/mL — ABNORMAL HIGH (ref 0.350–4.500)

## 2022-08-13 LAB — CBC
HCT: 31.6 % — ABNORMAL LOW (ref 36.0–46.0)
Hemoglobin: 10.3 g/dL — ABNORMAL LOW (ref 12.0–15.0)
MCH: 33.9 pg (ref 26.0–34.0)
MCHC: 32.6 g/dL (ref 30.0–36.0)
MCV: 103.9 fL — ABNORMAL HIGH (ref 80.0–100.0)
Platelets: 324 10*3/uL (ref 150–400)
RBC: 3.04 MIL/uL — ABNORMAL LOW (ref 3.87–5.11)
RDW: 20.9 % — ABNORMAL HIGH (ref 11.5–15.5)
WBC: 6.6 10*3/uL (ref 4.0–10.5)
nRBC: 0.8 % — ABNORMAL HIGH (ref 0.0–0.2)

## 2022-08-13 LAB — FERRITIN: Ferritin: 175 ng/mL (ref 11–307)

## 2022-08-13 LAB — CMP (CANCER CENTER ONLY)
ALT: 27 U/L (ref 0–44)
AST: 21 U/L (ref 15–41)
Albumin: 3.4 g/dL — ABNORMAL LOW (ref 3.5–5.0)
Alkaline Phosphatase: 98 U/L (ref 38–126)
Anion gap: 8 (ref 5–15)
BUN: 13 mg/dL (ref 8–23)
CO2: 25 mmol/L (ref 22–32)
Calcium: 8.7 mg/dL — ABNORMAL LOW (ref 8.9–10.3)
Chloride: 105 mmol/L (ref 98–111)
Creatinine: 1.05 mg/dL — ABNORMAL HIGH (ref 0.44–1.00)
GFR, Estimated: 53 mL/min — ABNORMAL LOW (ref >60)
Glucose, Bld: 102 mg/dL — ABNORMAL HIGH (ref 70–99)
Potassium: 4 mmol/L (ref 3.5–5.1)
Sodium: 138 mmol/L (ref 135–145)
Total Bilirubin: 0.7 mg/dL (ref 0.3–1.2)
Total Protein: 5.7 g/dL — ABNORMAL LOW (ref 6.5–8.1)

## 2022-08-13 LAB — URINALYSIS, MICROSCOPIC (REFLEX): RBC / HPF: NONE SEEN RBC/hpf (ref 0–5)

## 2022-08-13 LAB — RETICULOCYTES
Immature Retic Fract: 32.5 % — ABNORMAL HIGH (ref 2.3–15.9)
RBC.: 2.95 MIL/uL — ABNORMAL LOW (ref 3.87–5.11)
Retic Count, Absolute: 246.9 10*3/uL — ABNORMAL HIGH (ref 19.0–186.0)
Retic Ct Pct: 8.4 % — ABNORMAL HIGH (ref 0.4–3.1)

## 2022-08-13 LAB — IRON AND IRON BINDING CAPACITY (CC-WL,HP ONLY)
Iron: 73 ug/dL (ref 28–170)
Saturation Ratios: 29 % (ref 10.4–31.8)
TIBC: 255 ug/dL (ref 250–450)
UIBC: 182 ug/dL (ref 148–442)

## 2022-08-13 LAB — TROPONIN I (HIGH SENSITIVITY)
Troponin I (High Sensitivity): 7 ng/L (ref <18)
Troponin I (High Sensitivity): 8 ng/L (ref <18)

## 2022-08-13 LAB — BASIC METABOLIC PANEL
Anion gap: 7 (ref 5–15)
BUN: 12 mg/dL (ref 8–23)
CO2: 23 mmol/L (ref 22–32)
Calcium: 8 mg/dL — ABNORMAL LOW (ref 8.9–10.3)
Chloride: 104 mmol/L (ref 98–111)
Creatinine, Ser: 0.99 mg/dL (ref 0.44–1.00)
GFR, Estimated: 57 mL/min — ABNORMAL LOW (ref >60)
Glucose, Bld: 100 mg/dL — ABNORMAL HIGH (ref 70–99)
Potassium: 3.6 mmol/L (ref 3.5–5.1)
Sodium: 134 mmol/L — ABNORMAL LOW (ref 135–145)

## 2022-08-13 LAB — BRAIN NATRIURETIC PEPTIDE: B Natriuretic Peptide: 548.3 pg/mL — ABNORMAL HIGH (ref 0.0–100.0)

## 2022-08-13 LAB — FOLATE: Folate: >40 ng/mL (ref >5.9)

## 2022-08-13 LAB — VITAMIN B12: Vitamin B-12: 1618 pg/mL — ABNORMAL HIGH (ref 180–914)

## 2022-08-13 LAB — T4, FREE: Free T4: 0.55 ng/dL — ABNORMAL LOW (ref 0.61–1.12)

## 2022-08-13 LAB — MAGNESIUM: Magnesium: 2.3 mg/dL (ref 1.7–2.4)

## 2022-08-13 MED ORDER — APOAEQUORIN 10 MG PO CAPS: 1.0000 | ORAL_CAPSULE | Freq: Every morning | ORAL | Status: DC

## 2022-08-13 MED ORDER — TIMOLOL MALEATE 0.5 % OP SOLN
1.0000 [drp] | Freq: Every day | OPHTHALMIC | Status: DC
  Administered 2022-08-13 – 2022-08-15 (×3): 1 [drp] via OPHTHALMIC
  Filled 2022-08-13: qty 5

## 2022-08-13 MED ORDER — ALBUTEROL SULFATE HFA 108 (90 BASE) MCG/ACT IN AERS: 2.0000 | INHALATION_SPRAY | Freq: Four times a day (QID) | RESPIRATORY_TRACT | Status: DC | PRN

## 2022-08-13 MED ORDER — APIXABAN 5 MG PO TABS
5.0000 mg | ORAL_TABLET | Freq: Two times a day (BID) | ORAL | Status: DC
  Administered 2022-08-13 – 2022-08-16 (×6): 5 mg via ORAL
  Filled 2022-08-13 (×6): qty 1

## 2022-08-13 MED ORDER — ONDANSETRON HCL 4 MG/2ML IJ SOLN: 4.0000 mg | Freq: Four times a day (QID) | INTRAMUSCULAR | Status: DC | PRN

## 2022-08-13 MED ORDER — ATORVASTATIN CALCIUM 40 MG PO TABS
40.0000 mg | ORAL_TABLET | Freq: Every day | ORAL | Status: DC
  Administered 2022-08-14 – 2022-08-15 (×2): 40 mg via ORAL
  Filled 2022-08-13 (×2): qty 1

## 2022-08-13 MED ORDER — SODIUM CHLORIDE 0.9 % IV SOLN
500.0000 mg | Freq: Once | INTRAVENOUS | Status: AC
  Administered 2022-08-13: 500 mg via INTRAVENOUS
  Filled 2022-08-13: qty 5

## 2022-08-13 MED ORDER — MOMETASONE FURO-FORMOTEROL FUM 200-5 MCG/ACT IN AERO
2.0000 | INHALATION_SPRAY | Freq: Two times a day (BID) | RESPIRATORY_TRACT | Status: DC
  Administered 2022-08-13 – 2022-08-16 (×6): 2 via RESPIRATORY_TRACT
  Filled 2022-08-13: qty 8.8

## 2022-08-13 MED ORDER — METOPROLOL SUCCINATE ER 50 MG PO TB24
75.0000 mg | ORAL_TABLET | Freq: Every day | ORAL | Status: DC
  Filled 2022-08-13: qty 1

## 2022-08-13 MED ORDER — DORZOLAMIDE HCL-TIMOLOL MAL 2-0.5 % OP SOLN: 1.0000 [drp] | Freq: Every day | OPHTHALMIC | Status: DC

## 2022-08-13 MED ORDER — THYROID 30 MG PO TABS: 15.0000 mg | ORAL_TABLET | Freq: Every day | ORAL | Status: DC

## 2022-08-13 MED ORDER — GABAPENTIN 400 MG PO CAPS
400.0000 mg | ORAL_CAPSULE | Freq: Two times a day (BID) | ORAL | Status: DC
  Administered 2022-08-13 – 2022-08-16 (×5): 400 mg via ORAL
  Filled 2022-08-13 (×6): qty 1

## 2022-08-13 MED ORDER — NETARSUDIL DIMESYLATE 0.02 % OP SOLN: 1.0000 [drp] | Freq: Every day | OPHTHALMIC | Status: DC

## 2022-08-13 MED ORDER — METOPROLOL TARTRATE 5 MG/5ML IV SOLN
5.0000 mg | INTRAVENOUS | Status: AC | PRN
  Administered 2022-08-13 (×3): 5 mg via INTRAVENOUS
  Filled 2022-08-13 (×3): qty 5

## 2022-08-13 MED ORDER — ALBUTEROL SULFATE (2.5 MG/3ML) 0.083% IN NEBU: 2.5000 mg | INHALATION_SOLUTION | Freq: Four times a day (QID) | RESPIRATORY_TRACT | Status: DC | PRN

## 2022-08-13 MED ORDER — THYROID 60 MG PO TABS
75.0000 mg | ORAL_TABLET | Freq: Every day | ORAL | Status: DC
  Administered 2022-08-14 – 2022-08-16 (×3): 75 mg via ORAL
  Filled 2022-08-13 (×3): qty 1

## 2022-08-13 MED ORDER — TRAMADOL HCL 50 MG PO TABS: 50.0000 mg | ORAL_TABLET | Freq: Four times a day (QID) | ORAL | Status: DC | PRN

## 2022-08-13 MED ORDER — IOHEXOL 300 MG/ML  SOLN
80.0000 mL | Freq: Once | INTRAMUSCULAR | Status: AC | PRN
  Administered 2022-08-13: 80 mL via INTRAVENOUS

## 2022-08-13 MED ORDER — FUROSEMIDE 10 MG/ML IJ SOLN
40.0000 mg | Freq: Two times a day (BID) | INTRAMUSCULAR | Status: DC
  Administered 2022-08-13: 40 mg via INTRAVENOUS
  Filled 2022-08-13 (×2): qty 4

## 2022-08-13 MED ORDER — SODIUM CHLORIDE 0.9 % IV SOLN
1.0000 g | Freq: Once | INTRAVENOUS | Status: AC
  Administered 2022-08-13: 1 g via INTRAVENOUS
  Filled 2022-08-13: qty 10

## 2022-08-13 MED ORDER — LEVETIRACETAM ER 500 MG PO TB24
500.0000 mg | ORAL_TABLET | Freq: Every day | ORAL | Status: DC
  Administered 2022-08-13 – 2022-08-15 (×3): 500 mg via ORAL
  Filled 2022-08-13 (×3): qty 1

## 2022-08-13 MED ORDER — LATANOPROST 0.005 % OP SOLN
1.0000 [drp] | Freq: Every day | OPHTHALMIC | Status: DC
  Administered 2022-08-13 – 2022-08-15 (×3): 1 [drp] via OPHTHALMIC
  Filled 2022-08-13: qty 2.5

## 2022-08-13 MED ORDER — DORZOLAMIDE HCL 2 % OP SOLN
1.0000 [drp] | Freq: Every day | OPHTHALMIC | Status: DC
  Administered 2022-08-13 – 2022-08-15 (×3): 1 [drp] via OPHTHALMIC
  Filled 2022-08-13: qty 10

## 2022-08-13 MED ORDER — DILTIAZEM HCL-DEXTROSE 125-5 MG/125ML-% IV SOLN (PREMIX)
5.0000 mg/h | INTRAVENOUS | Status: DC
  Administered 2022-08-13: 15 mg/h via INTRAVENOUS
  Administered 2022-08-13: 5 mg/h via INTRAVENOUS
  Filled 2022-08-13 (×4): qty 125

## 2022-08-13 MED ORDER — ACETAMINOPHEN 325 MG PO TABS: 650.0000 mg | ORAL_TABLET | Freq: Four times a day (QID) | ORAL | Status: DC | PRN

## 2022-08-13 MED ORDER — THYROID 60 MG PO TABS: 60.0000 mg | ORAL_TABLET | Freq: Every day | ORAL | Status: DC

## 2022-08-13 NOTE — ED Provider Notes (Addendum)
[facility] Provider Note   CSN: [id number] Arrival date & time: 08/13/22  1205     History  Chief Complaint  Patient presents with   Atrial Fibrillation    Patricia [name] is a 82 y.o. female with breast cancer on immunotherapy, HLD, goiter, h/o lung cancer, asthma, CKD stage 3, persistent afib on eliquis, HTN, chronic diastolic HF, h/o CVA, seizures, pulm HTN, who presents with afib w/ RVR.    Pt sent to ED from oncologist for Afib w/ RVR, has history of same. Today they report that's she had a gentle fall on her way to oncology clinic. Was walking with her walker when she got dizzy after making a turn. She gently fell over without LOC or head injury. No chest pain. Dizziness is better when she is not ambulating. No neuro changes, slurred speech, facial droop. Many years ago was diagnosed w/ Afib and attempt to cardiovert in cards office x 2 with no result per patient's husband, so was DC'd on metoprolol/eliquis. Has had no recent changes to these meds. Denies any pain or discomfort at this time. Feels like she might be dizzy if she tries to stand up.  Increased fatigue but no urinary symptoms, N/V/D/C. Endorses leg swelling bilaterally as well which isn't new, had intermedullary nail placed in L femur a few months ago and still has dependent hematoma in L leg from that, so ecchymosis in L lower leg.       Atrial Fibrillation       Home Medications Prior to Admission medications   Medication Sig Start Date End Date Taking? Authorizing Provider  albuterol (PROVENTIL HFA;VENTOLIN HFA) 108 (90 Base) MCG/ACT inhaler Inhale 2 puffs into the lungs every 6 (six) hours as needed for wheezing or shortness of breath.    [provider]  apixaban (ELIQUIS) 5 MG TABS tablet Take 1 tablet (5 mg total) by mouth 2 (two) times daily. 08/03/22   Quintella Reichert, MD  Apoaequorin (PREVAGEN) 10 MG CAPS Take 1 capsule by mouth every morning. 11/28/20    Carlean Jews, NP  ARMOUR THYROID 15 MG tablet TAKE 1 TABLET DAILY ALONG WITH 60 MG 12/16/21   Abonza, [place], PA-C  ARMOUR THYROID 60 MG tablet TAKE 1 TABLET DAILY BEFORE BREAKFAST 01/13/22   Abonza, Maritza, PA-C  atorvastatin (LIPITOR) 40 MG tablet Take 1 tablet (40 mg total) by mouth daily before supper. 11/25/21 11/26/22  Quintella Reichert, MD  bimatoprost (LUMIGAN) 0.01 % SOLN Place 1 drop into both eyes 2 (two) times daily.    [provider]  calcium-vitamin D (OSCAL WITH D) 500-200 MG-UNIT tablet Take 1 tablet by mouth daily with breakfast. Patient not taking: Reported on 08/13/2022 11/03/17   Thomasene Lot, DO  dexamethasone (DECADRON) 4 MG tablet Take 2 tablets (8 mg) by mouth daily for 3 days starting the day after chemotherapy. Take with food. 07/21/22   Josph Macho, MD  diltiazem (CARDIZEM CD) 300 MG 24 hr capsule Take 1 capsule (300 mg total) by mouth daily. 03/16/22   Quintella Reichert, MD  dorzolamide-timolol (COSOPT) 22.3-6.8 MG/ML ophthalmic solution Place 1 drop into both eyes at bedtime.    [provider]  fluticasone Aleda Grana) 50 MCG/ACT nasal spray 1 spray each nostril following sinus rinses twice daily 02/08/18   Thomasene Lot, DO  fluticasone-salmeterol (ADVAIR HFA) 230-21 MCG/ACT inhaler USE 2 INHALATIONS TWICE A DAY 06/25/22   Saralyn Pilar A, PA  furosemide (LASIX) 40  MG tablet TAKE 1 TABLET TWICE A DAY 05/25/22   Quintella Reichert, MD  gabapentin (NEURONTIN) 400 MG capsule TAKE 1 CAPSULE TWICE A DAY 11/25/21   Abonza, Maritza, PA-C  L-Methylfolate-B12-B6-B2 (CEREFOLIN) 07-31-48-5 MG TABS Take 1 tablet by mouth every morning. Patient not taking: Reported on 08/13/2022 02/10/22   Ihor Austin, NP  levETIRAcetam (KEPPRA XR) 500 MG 24 hr tablet Take 1 tablet (500 mg total) by mouth at bedtime. 08/07/21   Ihor Austin, NP  loratadine (CLARITIN) 10 MG tablet Take 10 mg by mouth daily.    [provider]  memantine (NAMENDA) 10 MG tablet Take 1  tablet (10 mg total) by mouth 2 (two) times daily. 08/07/21   Ihor Austin, NP  metoprolol succinate (TOPROL-XL) 50 MG 24 hr tablet Take 1.5 tablets (75 mg total) by mouth daily. Take with or immediately following a meal. 09/05/21   Turner, Cornelious Bryant, MD  Multiple Vitamins-Iron (MULTIVITAMIN/IRON) TABS Take 1 tablet by mouth daily.    [provider]  Netarsudil Dimesylate (RHOPRESSA) 0.02 % SOLN Place 1 drop into both eyes at bedtime.    [provider]  ondansetron (ZOFRAN) 8 MG tablet Take 1 tablet (8 mg total) by mouth every 8 (eight) hours as needed for nausea or vomiting. Start on the third day after chemotherapy. 07/21/22   Josph Macho, MD  OVER THE COUNTER MEDICATION Take 1 tablet by mouth daily. Zyflamend otc herbal supplement    [provider]  pantoprazole (PROTONIX) 40 MG tablet TAKE 1 TABLET DAILY 02/26/22   Carlean Jews, NP  potassium chloride SA (KLOR-CON M) 20 MEQ tablet TAKE 1 TABLET TWICE A DAY 07/30/22   Quintella Reichert, MD  prochlorperazine (COMPAZINE) 10 MG tablet Take 1 tablet (10 mg total) by mouth every 6 (six) hours as needed for nausea or vomiting. 07/21/22   Josph Macho, MD  traMADol (ULTRAM) 50 MG tablet Take 1 tablet (50 mg total) by mouth every 6 (six) hours as needed (post op pain). 07/29/22   Elodia Florence, PA-C  Vitamin D, Ergocalciferol, (DRISDOL) 1.25 MG (50000 UNIT) CAPS capsule TAKE 1 CAPSULE WEEKLY 10/28/21   Carlean Jews, NP      Allergies    Combigan [brimonidine tartrate-timolol], Sulfa antibiotics, and Sulfamethoxazole-trimethoprim    Review of Systems   Review of Systems A 10 point review of systems was performed and is negative unless otherwise reported in HPI.  Physical Exam Updated Vital Signs BP (!) 121/90   Pulse 99   Temp 98.3 F (36.8 C) (Oral)   Resp 17   Ht 5' (1.524 m)   Wt 69.3 kg   SpO2 95%   BMI 29.84 kg/m  Physical Exam General: Normal appearing female, lying in bed.  HEENT: PERRLA, Sclera  anicteric, MMM, trachea midline.  Cardiology: Tachycardic irregularly irregular rhythm, no murmurs/rubs/gallops. BL radial and DP pulses equal bilaterally.  Resp: Normal respiratory rate and effort. CTAB, no wheezes, rhonchi, crackles.  Abd: Soft, non-tender, non-distended. No rebound tenderness or guarding.  GU: Deferred. MSK: 1+ nonpitting peripheral edema bilaterally. Extremities without deformity or TTP. No cyanosis or clubbing. Skin: warm, dry. Ecchymosis on L leg.  Neuro: A&Ox4, CNs II-XII grossly intact. MAEs. Sensation grossly intact.  Psych: Normal mood and affect.   ED Results / Procedures / Treatments   Labs (all labs ordered are listed, but only abnormal results are displayed) Labs Reviewed  BASIC METABOLIC PANEL - Abnormal; Notable for the following components:  Result Value   Sodium 134 (*)    Glucose, Bld 100 (*)    Calcium 8.0 (*)    GFR, Estimated 57 (*)    All other components within normal limits  CBC - Abnormal; Notable for the following components:   RBC 3.04 (*)    Hemoglobin 10.3 (*)    HCT 31.6 (*)    MCV 103.9 (*)    RDW 20.9 (*)    nRBC 0.8 (*)    All other components within normal limits  MAGNESIUM  URINALYSIS, ROUTINE W REFLEX MICROSCOPIC  TSH  T4, FREE    EKG EKG Interpretation  Date/Time:  Thursday August 13 2022 12:22:38 EDT Ventricular Rate:  135 PR Interval:    QRS Duration: 86 QT Interval:  287 QTC Calculation: 431 R Axis:   81 Text Interpretation: Atrial fibrillation Borderline right axis deviation Confirmed by Vivi Barrack [phone]) on 08/13/2022 12:56:38 PM  Radiology DG Chest Port 1 View  Result Date: 08/13/2022 CLINICAL DATA:  Atrial fibrillation EXAM: PORTABLE CHEST 1 VIEW COMPARISON:  Previous chest radiographs done on 02/22/2020, CT chest done on 02/05/2021 FINDINGS: Transverse diameter of heart is increased. There is poor inspiration. There are no signs of alveolar pulmonary edema. There is increased density in left lower  lung field. Surgical clips are seen in left chest wall. IMPRESSION: Cardiomegaly. There are no signs of alveolar pulmonary edema. Increased density in left lower lung field may suggest small pleural effusion and underlying atelectasis/pneumonia. Electronically Signed   By: Ernie Avena M.D.   On: 08/13/2022 13:25    Procedures .Critical Care  Performed by: Loetta Rough, MD Authorized by: Loetta Rough, MD   Critical care provider statement:    Critical care time (minutes):  30   Critical care was necessary to treat or prevent imminent or life-threatening deterioration of the following conditions:  Cardiac failure   Critical care was time spent personally by me on the following activities:  Development of treatment plan with patient or surrogate, discussions with consultants, evaluation of patient's response to treatment, examination of patient, ordering and review of laboratory studies, ordering and review of radiographic studies, ordering and performing treatments and interventions, pulse oximetry, re-evaluation of patient's condition, review of old charts and obtaining history from patient or surrogate   Care discussed with: admitting provider       Medications Ordered in ED Medications  cefTRIAXone (ROCEPHIN) 1 g in sodium chloride 0.9 % 100 mL IVPB (has no administration in time range)  azithromycin (ZITHROMAX) 500 mg in sodium chloride 0.9 % 250 mL IVPB (has no administration in time range)  metoprolol tartrate (LOPRESSOR) injection 5 mg (5 mg Intravenous Given 08/13/22 1350)    ED Course/ Medical Decision Making/ A&P                          Medical Decision Making Amount and/or Complexity of Data Reviewed Labs: ordered. Decision-making details documented in ED Course. Radiology: ordered. Decision-making details documented in ED Course.  Risk Prescription drug management. Decision regarding hospitalization.    This patient presents to the ED for concern of Afib w/  RVR, this involves an extensive number of treatment options, and is a complaint that carries with it a high risk of complications and morbidity.  I considered the following differential and admission for this acute, potentially life threatening condition.   MDM:    Patient presents from oncology clinic with A-fib with RVR.  EKG  and telemetry both demonstrate A-fib RVR without signs of ischemia.  Patient does have history of A-fib with unsuccessful cardioversion in the past manage on metoprolol.  She does have persistent A-fib and is normally in atrial fibrillation just without a rapid rate.  She has not had any changes in her meds recently that would explain this, otherwise feels in her normal state of health and is afebrile with normal p.o. intake, low concern for hypovolemia as the cause.  Considered PE given recent surgery/hospitalization and active cancer though patient does take Eliquis and has no chest pain or shortness of breath, no tachypnea/DOE on exam and it is less likely.  Considered infection as a cause though she reports no infectious symptoms but she is on immunotherapy though she is only taken one dose so far.  Will evaluate for common infections like pneumonia or UTI on labs and chest x-ray.  Also consider Electra abnormalities or thyroid abnormalities as patient does have a history of goiter and will get thyroid panel.  Will start management with metoprolol 5 mg IV every 5 minutes x 3 doses and evaluate response and rate.   Clinical Course as of 08/13/22 1507  Thu Aug 13, 2022  1316 Potassium: 3.6 wnl [HN]  1316 Sodium(!): 134 Unremarkable in the context of this patient's presentation  [HN]  1316 WBC: 6.6 No leukocytosis  [HN]  1317 Hemoglobin(!): 10.3 C/w priors [HN]  1419 Persistent Afib RVR despite 5 mg IV metop x 3 doses. Consulted to cards. [HN]  1505 DG Chest Port 1 View Cardiomegaly. There are no signs of alveolar pulmonary edema. Increased density in left lower lung  field may suggest small pleural effusion and underlying atelectasis/pneumonia.   [HN]  1505 CXR shows maybe a L PNA or pleural effusion [HN]  1506 Patient is consulted to cardiology started on IV antibiotics.  Will plan for admission for patient. Patient is signed out to the oncoming ED physician Dr. Deretha Emory who is made aware of her history, presentation, exam, workup, and plan.   [HN]    Clinical Course User Index [HN] Loetta Rough, MD    Labs: I Ordered, and personally interpreted labs.  The pertinent results include: Those listed above  Imaging Studies ordered: I ordered imaging studies including chest x-ray I independently visualized and interpreted imaging. I agree with the radiologist interpretation  Additional history obtained from husband at bedside.    Cardiac Monitoring: The patient was maintained on a cardiac monitor.  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 Dx 04/2015 at time of stroke. b. DCCV 06/2015 - did not hold. Amio started then stopped due to QT prolongation.   Chronic cough    Chronic diastolic heart failure (HCC) 06/17/2015   CKD (chronic kidney disease), stage III (HCC)    Cough variant asthma    Dementia (HCC)  Disease of pharynx or nasopharynx    Diverticulosis    Enlargement of right atrium 01/23/2020   Essential  hypertension    GERD (gastroesophageal reflux disease)    Glaucoma    Hiatal hernia    Hyperlipidemia    Hypothyroidism    Internal hemorrhoids    Laryngospasm    Mitral regurgitation    mild to moderate by echo 12/2019 and moderate by TEE 12/2020   Multinodular goiter    Osteopenia    Postmenopausal    Pulmonary HTN (HCC)     Moderate with PASP by echo 2018 likely Group 2 from pulmonary venous HTN from CHF>>normal PAP by echo 12/2019   Radiation 10/22/14-11/23/14   Left Breast   Stricture and stenosis of cervix    Stroke (HCC)    TIAs     Medicines Meds ordered this encounter  Medications   metoprolol tartrate (LOPRESSOR) injection 5 mg   cefTRIAXone (ROCEPHIN) 1 g in sodium chloride 0.9 % 100 mL IVPB    Order Specific Question:   Antibiotic Indication:    Answer:   CAP   azithromycin (ZITHROMAX) 500 mg in sodium chloride 0.9 % 250 mL IVPB    I have reviewed the patients home medicines and have made adjustments as needed  Problem List / ED Course: Problem List Items Addressed This Visit   None Visit Diagnoses     Atrial fibrillation with RVR (HCC)    -  Primary   Relevant Medications   metoprolol tartrate (LOPRESSOR) injection 5 mg (Completed)   Pneumonia of left lower lobe due to infectious organism       Relevant Medications   cefTRIAXone (ROCEPHIN) 1 g in sodium chloride 0.9 % 100 mL IVPB (Start on 08/13/2022  3:15 PM)   azithromycin (ZITHROMAX) 500 mg in sodium chloride 0.9 % 250 mL IVPB (Start on 08/13/2022  3:15 PM)             Loetta Rough, MD 08/13/22 [phone]

## 2022-08-13 NOTE — Assessment & Plan Note (Signed)
Blood pressure is well-controlled at this time.  Will monitor closely while on IV diltiazem.

## 2022-08-13 NOTE — Progress Notes (Signed)
PHARMACIST - PHYSICIAN ORDER COMMUNICATION  CONCERNING: P&T Medication Policy on Herbal Medications  DESCRIPTION:  This patient's order for:  Apoaequorin  has been noted.  This product(s) is classified as an "herbal" or natural product. Due to a lack of definitive safety studies or FDA approval, nonstandard manufacturing practices, plus the potential risk of unknown drug-drug interactions while on inpatient medications, the Pharmacy and Therapeutics Committee does not permit the use of "herbal" or natural products of this type within [facility].   ACTION TAKEN: The pharmacy department is unable to verify this order at this time and your patient has been informed of this safety policy. Please reevaluate patient's clinical condition at discharge and address if the herbal or natural product(s) should be resumed at that time.   Dorna Leitz, PharmD, BCPS 08/13/2022 8:18 PM

## 2022-08-13 NOTE — Assessment & Plan Note (Signed)
CT imaging obtained today demonstrates increased size of left pleural effusion to moderate, with new right pleural effusion.  Given marked peripheral edema, I suspect this may be HFpEF associated.  Although patient reported upper your respiratory symptoms approximately 1 week ago, I do not suspect that this would cause effusions.  Low suspicion for infectious etiology at this time.  Differential also includes malignant associated effusions given history of stage IV breast cancer with new osseous metastasis.  - Management of HFpEF as noted below - If no improvement with diuresis, could consider thoracentesis

## 2022-08-13 NOTE — ED Notes (Signed)
ED Provider at bedside. 

## 2022-08-13 NOTE — Telephone Encounter (Signed)
Patient's spouse, Virl Diamond, returned call from CSW.  Called him back and left a vm.

## 2022-08-13 NOTE — Assessment & Plan Note (Addendum)
Patient is being with atrial fibrillation with RVR with rates as high as 120, unable to be controlled with IV metoprolol.  Currently improving with diltiazem infusion.  I suspect that this was precipitated by an acute HFpEF exacerbation with bilateral pleural effusions in the setting of recent surgery.  - Telemetry monitoring - Continue diltiazem infusion with titration to goal rate of < 105 - Hold home diltiazem - Continue home metoprolol - Continue home Eliquis

## 2022-08-13 NOTE — ED Notes (Signed)
Pulse noted to be 124

## 2022-08-13 NOTE — H&P (Signed)
History and Physical    Patient: Patricia [name] UJW:[id number] DOB: [birth year] DOA: 08/13/2022 DOS: the patient was seen and examined on 08/13/2022 PCP: [name], PA  Patient coming from: Home  Chief Complaint:  Chief Complaint  Patient presents with   Atrial Fibrillation   HPI: Patricia [name] is a 82 y.o. 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  Patricia [name] states that for the last 1 week, she has been having increased work of breathing but denies any clear shortness of breath at rest or with exertion.  She denies any chest pain, palpitations, abdominal distention, orthopnea.  She notes significant lower extremity swelling, with the left worse than the right since she has had surgery in that leg approximately 3 weeks ago but states that this is from being quite sedentary since surgery.  She denies any known history of congestive heart disease.  No reported nausea, vomiting, diarrhea.  She did have upper respiratory symptoms approximately 1 week ago that have resolved at this time.  Today, when she was walking to her oncologist office, she had sudden onset dizziness is slowly slumped down to the ground but did not lose consciousness or hit her head.  When she got to the office, she was told that her heart rate was very high and to come to the ED.  ED course: On initial arrival to the ED, patient's blood pressure was 117/106 with heart rate with a heart rate of 120.  She was saturating at 92% on room air.  She was tachypneic at 25/minute.  EKG demonstrating atrial fibrillation with RVR.  Workup demonstrated hemoglobin of 10.3, MCV of 103, sodium of 134, potassium 3.6, glucose 100, creatinine 0.99 with GFR of 57.  TSH of  5.6 with free T4 of 0.55.  Urinalysis with small leukocytes, rare bacteria and 6-10 WBC/hpf. Chest x-ray was obtained that demonstrated cardiomegaly with increased density in the left lower lobe.  CT of the chest was obtained that demonstrated small right and moderate left pleural effusion.  Left pleural effusion is increased and right pleural effusion is new, in addition to bandlike atelectasis, multichamber cardiomegaly.  Rate control was unobtainable with Lopressor and so patient was started on diltiazem infusion.  [facility] contacted for transfer for admission.  Review of Systems: As mentioned in the history of present illness. All other systems reviewed and are negative.  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 Dx 04/2015 at time of stroke. b. DCCV 06/2015 - did not hold. Amio started then stopped due to QT prolongation.   Chronic cough    Chronic diastolic heart failure (HCC) 06/17/2015   CKD (chronic kidney disease), stage III (HCC)    Cough variant asthma    Dementia (HCC)    Disease of pharynx or nasopharynx    Diverticulosis    Enlargement of right atrium 01/23/2020   Essential hypertension    GERD (gastroesophageal reflux disease)    Glaucoma    Hiatal hernia    Hyperlipidemia  Hypothyroidism    Internal hemorrhoids    Laryngospasm    Mitral regurgitation    mild to moderate by echo 12/2019 and moderate by TEE 12/2020   Multinodular goiter    Osteopenia    Postmenopausal    Pulmonary HTN (HCC)     Moderate with PASP by echo 2018 likely Group 2 from pulmonary venous HTN from CHF>>normal PAP by echo 12/2019   Radiation 10/22/14-11/23/14   Left Breast   Stricture and stenosis of cervix    Stroke (HCC)    TIAs   Past Surgical History:  Procedure  Laterality Date   BREAST LUMPECTOMY Left    BREAST SURGERY     CARDIOVERSION N/A 06/24/2015   Procedure: CARDIOVERSION;  Surgeon: Quintella Reichert, MD;  Location: [facility];  Service: Cardiovascular;  Laterality: N/A;   CARDIOVERSION N/A 05/19/2016   Procedure: CARDIOVERSION;  Surgeon: Quintella Reichert, MD;  Location: [facility];  Service: Cardiovascular;  Laterality: N/A;   COLONOSCOPY     EYE SURGERY Bilateral    cataract removal by Dr. Burgess Estelle   FEMUR IM NAIL Left 07/28/2022   Procedure: INTRAMEDULLARY (IM) NAIL FEMORAL;  Surgeon: Marcene Corning, MD;  Location: [facility];  Service: Orthopedics;  Laterality: Left;   LUNG CANCER SURGERY  2003   resection carcinoid lingula-lt upper lobe   RADIOACTIVE SEED GUIDED PARTIAL MASTECTOMY WITH AXILLARY SENTINEL LYMPH NODE BIOPSY Left 06/26/2014   Procedure: RADIOACTIVE SEED GUIDED PARTIAL MASTECTOMY WITH AXILLARY SENTINEL LYMPH NODE BIOPSY;  Surgeon: Almond Lint, MD;  Location: [facility];  Service: General;  Laterality: Left;   RE-EXCISION OF BREAST LUMPECTOMY Left 08/07/2014   Procedure: RE-EXCISION OF LEFT BREAST LUMPECTOMY;  Surgeon: Almond Lint, MD;  Location: [facility];  Service: General;  Laterality: Left;   RIGHT HEART CATH N/A 04/27/2016   Procedure: Right Heart Cath;  Surgeon: Laurey Morale, MD;  Location[facility] INVASIVE CV LAB;  Service: Cardiovascular;  Laterality: N/A;   TEE WITHOUT CARDIOVERSION N/A 01/07/2021   Procedure: TRANSESOPHAGEAL ECHOCARDIOGRAM (TEE);  Surgeon: Elease Hashimoto Deloris Ping, MD;  Location: [facility] ENDOSCOPY;  Service: Cardiovascular;  Laterality: N/A;   Social History:  reports that she quit smoking about 56 years ago. Her smoking use included cigarettes. She has a 3.75 pack-year smoking history. She has been exposed to tobacco smoke. She has never used smokeless tobacco. She reports that she does not currently use alcohol. She reports that she does not use drugs.  Allergies  Allergen Reactions    Combigan [Brimonidine Tartrate-Timolol] Itching    Per patient made eyes red, sore, and sensitivity to light   Sulfa Antibiotics Hives and Rash   Sulfamethoxazole-Trimethoprim Hives    Family History  Problem Relation Age of Onset   Stroke Mother    Hypertension Mother    Hyperlipidemia Mother    Stroke Father    Transient ischemic attack Father    Hyperlipidemia Father    Hypertension Father    Atrial fibrillation Son    Heart attack Neg Hx     Prior to Admission medications   Medication Sig Start Date End Date Taking? Authorizing Provider  albuterol (PROVENTIL HFA;VENTOLIN HFA) 108 (90 Base) MCG/ACT inhaler Inhale 2 puffs into the lungs every 6 (six) hours as needed for wheezing or shortness of breath.   Yes [provider]  apixaban (ELIQUIS) 5 MG TABS tablet Take 1 tablet (5 mg total) by mouth 2 (two) times daily. 08/03/22  Yes Quintella Reichert, MD  Apoaequorin (  PREVAGEN) 10 MG CAPS Take 1 capsule by mouth every morning. 11/28/20  Yes Boscia, Heather E, NP  ARMOUR THYROID 15 MG tablet TAKE 1 TABLET DAILY ALONG WITH 60 MG Patient taking differently: Take 15 mg by mouth daily. Take with 60mg  for a total dose of 75mg  daily. 12/16/21  Yes Abonza, Maritza, PA-C  ARMOUR THYROID 60 MG tablet TAKE 1 TABLET DAILY BEFORE BREAKFAST Patient taking differently: Take 60 mg by mouth daily. Takes with 15mg  daily. 01/13/22  Yes Abonza, Maritza, PA-C  atorvastatin (LIPITOR) 40 MG tablet Take 1 tablet (40 mg total) by mouth daily before supper. 11/25/21 11/26/22 Yes Turner, Traci R, MD  bimatoprost (LUMIGAN) 0.01 % SOLN Place 1 drop into both eyes 2 (two) times daily.   Yes [provider]  calcium-vitamin D (OSCAL WITH D) 500-200 MG-UNIT tablet Take 1 tablet by mouth daily with breakfast. 11/03/17  Yes Opalski, Deborah, DO  dexamethasone (DECADRON) 4 MG tablet Take 2 tablets (8 mg) by mouth daily for 3 days starting the day after chemotherapy. Take with food. 07/21/22  Yes Josph Macho,  MD  diltiazem (CARDIZEM CD) 300 MG 24 hr capsule Take 1 capsule (300 mg total) by mouth daily. 03/16/22  Yes Turner, Cornelious Bryant, MD  dorzolamide-timolol (COSOPT) 22.3-6.8 MG/ML ophthalmic solution Place 1 drop into both eyes at bedtime.   Yes [provider]  fluticasone (FLONASE) 50 MCG/ACT nasal spray 1 spray each nostril following sinus rinses twice daily 02/08/18  Yes Opalski, Deborah, DO  fluticasone-salmeterol (ADVAIR HFA) 230-21 MCG/ACT inhaler USE 2 INHALATIONS TWICE A DAY 06/25/22  Yes Saralyn Pilar A, PA  furosemide (LASIX) 40 MG tablet TAKE 1 TABLET TWICE A DAY 05/25/22  Yes Turner, Traci R, MD  gabapentin (NEURONTIN) 400 MG capsule TAKE 1 CAPSULE TWICE A DAY 11/25/21  Yes Abonza, Maritza, PA-C  L-Methylfolate-B12-B6-B2 (CEREFOLIN) 07-31-48-5 MG TABS Take 1 tablet by mouth every morning. 02/10/22  Yes McCue, Shanda Bumps, NP  levETIRAcetam (KEPPRA XR) 500 MG 24 hr tablet Take 1 tablet (500 mg total) by mouth at bedtime. 08/07/21  Yes McCue, Shanda Bumps, NP  loratadine (CLARITIN) 10 MG tablet Take 10 mg by mouth daily.   Yes [provider]  memantine (NAMENDA) 10 MG tablet Take 1 tablet (10 mg total) by mouth 2 (two) times daily. 08/07/21  Yes McCue, Shanda Bumps, NP  metoprolol succinate (TOPROL-XL) 50 MG 24 hr tablet Take 1.5 tablets (75 mg total) by mouth daily. Take with or immediately following a meal. 09/05/21  Yes Turner, Cornelious Bryant, MD  Multiple Vitamins-Iron (MULTIVITAMIN/IRON) TABS Take 1 tablet by mouth daily.   Yes [provider]  Netarsudil Dimesylate (RHOPRESSA) 0.02 % SOLN Place 1 drop into both eyes at bedtime.   Yes [provider]  ondansetron (ZOFRAN) 8 MG tablet Take 1 tablet (8 mg total) by mouth every 8 (eight) hours as needed for nausea or vomiting. Start on the third day after chemotherapy. 07/21/22  Yes Josph Macho, MD  OVER THE COUNTER MEDICATION Take 1 tablet by mouth daily. Zyflamend otc herbal supplement   Yes [provider]  pantoprazole  (PROTONIX) 40 MG tablet TAKE 1 TABLET DAILY 02/26/22  Yes Boscia, Heather E, NP  potassium chloride SA (KLOR-CON M) 20 MEQ tablet TAKE 1 TABLET TWICE A DAY 07/30/22  Yes Turner, Cornelious Bryant, MD  traMADol (ULTRAM) 50 MG tablet Take 1 tablet (50 mg total) by mouth every 6 (six) hours as needed (post op pain). 07/29/22  Yes Elodia Florence, PA-C  Vitamin D, Ergocalciferol, (DRISDOL) 1.25 MG (50000 UNIT) CAPS capsule TAKE 1 CAPSULE WEEKLY 10/28/21  Yes Boscia, Heather E, NP  prochlorperazine (COMPAZINE) 10 MG tablet Take 1 tablet (10 mg total) by mouth every 6 (six) hours as needed for nausea or vomiting. 07/21/22   Josph Macho, MD    Physical Exam: Vitals:   08/13/22 1700 08/13/22 1705 08/13/22 1739 08/13/22 1826  BP: 104/65  106/73 107/81  Pulse: (!) 138 (!) 119  97  Resp:    (!) 24  Temp:    98.4 F (36.9 C)  TempSrc:    Oral  SpO2: 97% 97%  93%  Weight:      Height:       Physical Exam Vitals and nursing note reviewed.  Constitutional:      General: She is not in acute distress.    Appearance: She is overweight.  HENT:     Head: Normocephalic and atraumatic.     Mouth/Throat:     Mouth: Mucous membranes are moist.     Pharynx: Oropharynx is clear.  Eyes:     Pupils: Pupils are equal, round, and reactive to light.  Cardiovascular:     Rate and Rhythm: Tachycardia present. Rhythm irregularly irregular.     Pulses: No decreased pulses.     Heart sounds: No murmur heard. Pulmonary:     Effort: Pulmonary effort is normal. No respiratory distress.     Breath sounds: Rales (Rales bilaterally up to the mid lung fields) present. No wheezing or rhonchi.  Abdominal:     General: Bowel sounds are normal. There is no distension.     Palpations: Abdomen is soft.     Tenderness: There is no abdominal tenderness. There is no guarding.  Musculoskeletal:     Right lower leg: 2+ Edema present.     Left lower leg: 3+ Edema present.  Skin:    General: Skin is warm and dry.  Neurological:      General: No focal deficit present.     Mental Status: She is alert and oriented to person, place, and time. Mental status is at baseline.  Psychiatric:        Mood and Affect: Mood normal.        Behavior: Behavior normal.    Data Reviewed: CBC with WBC of 6.6, hemoglobin 10.3, MCV 103.9 and platelets of 324 BMP with sodium 134, potassium 3.6, bicarb 23, glucose 100, BUN 12, creatinine 0.99, GFR 57 Magnesium within normal limits at 2.3 TSH elevated 5.6 with free T4 low at 0.55 Urinalysis with small leukocytes, rare bacteria and 6-10 WBC/hpf Ferritin within normal limits at 175 Iron panel with no evidence of iron deficiency anemia  Initial EKG personally reviewed.  Narrow complex tachycardia with rate of 120 consistent with atrial fibrillation with RVR.  No ST or T wave changes concerning for acute ischemia.  CT Chest W Contrast  Result Date: 08/13/2022 CLINICAL DATA:  Pneumonia, complication suspected, xray done EXAM: CT CHEST WITH CONTRAST TECHNIQUE: Multidetector CT imaging of the chest was performed during intravenous contrast administration. RADIATION DOSE REDUCTION: This exam was performed according to the departmental dose-optimization program which includes automated exposure control, adjustment of the mA and/or kV according to patient size and/or use of iterative reconstruction technique. CONTRAST:  80mL OMNIPAQUE IOHEXOL 300 MG/ML  SOLN COMPARISON:  Radiograph earlier today.  PET CT 07/02/2022 FINDINGS: Cardiovascular: Multi chamber cardiomegaly, with primarily biatrial enlargement. Moderate atherosclerosis of the thoracic aorta, no dissection. There is no  pulmonary embolus, excellent pulmonary arterial opacification on this non dedicated arterial exam. No pericardial effusion. Mediastinum/Nodes: Small hiatal hernia. Scattered small mediastinal nodes are all subcentimeter, not enlarged by size criteria. No axillary adenopathy, prior left axillary node dissection. Lungs/Pleura: There are  small right and moderate left pleural effusion. The left pleural effusion has increased from prior PET. The right pleural effusion is new. No convincing pleural nodularity or enhancement. Adjacent compressive as well as bandlike atelectasis. Treatment related changes in the left anterior hemithorax. No airspace disease to suggest pneumonia. No suspicious pulmonary nodule or mass. Trachea and central airways are clear. Upper Abdomen: Mild body wall edema in the upper abdomen. Fat containing left adrenal nodule measuring 2 cm, unchanged and not hypermetabolic on PET. Musculoskeletal: The metastatic disease involving the left clavicular head on prior PET is only faintly visualized with slight cortical thinning on CT. There is slight heterogeneity of the left anterior T9 rib. Multilevel degenerative change in the spine. Postsurgical change in the left breast. IMPRESSION: 1. Small right and moderate left pleural effusions. The left pleural effusion has increased from prior PET. The right pleural effusion is new. 2. Adjacent compressive as well as bandlike atelectasis. No airspace disease to suggest pneumonia. 3. Multi chamber cardiomegaly, with primarily biatrial enlargement. 4. The metastatic disease involving the left clavicular head on prior PET is only faintly visualized with slight cortical thinning on CT. There is slight heterogeneity of the left anterior T9 rib. Aortic Atherosclerosis (ICD10-I70.0). Electronically Signed   By: Narda Rutherford M.D.   On: 08/13/2022 17:41   DG Chest Port 1 View  Result Date: 08/13/2022 CLINICAL DATA:  Atrial fibrillation EXAM: PORTABLE CHEST 1 VIEW COMPARISON:  Previous chest radiographs done on 02/22/2020, CT chest done on 02/05/2021 FINDINGS: Transverse diameter of heart is increased. There is poor inspiration. There are no signs of alveolar pulmonary edema. There is increased density in left lower lung field. Surgical clips are seen in left chest wall. IMPRESSION:  Cardiomegaly. There are no signs of alveolar pulmonary edema. Increased density in left lower lung field may suggest small pleural effusion and underlying atelectasis/pneumonia. Electronically Signed   By: Ernie Avena M.D.   On: 08/13/2022 13:25    Results are pending, will review when available.  Assessment and Plan:  * Atrial fibrillation with RVR (HCC) Patient is being with atrial fibrillation with RVR with rates as high as 120, unable to be controlled with IV metoprolol.  Currently improving with diltiazem infusion.  I suspect that this was precipitated by an acute HFpEF exacerbation with bilateral pleural effusions in the setting of recent surgery.  - Telemetry monitoring - Continue diltiazem infusion with titration to goal rate of < 105 - Hold home diltiazem - Continue home metoprolol - Continue home Eliquis  Pleural effusion, bilateral CT imaging obtained today demonstrates increased size of left pleural effusion to moderate, with new right pleural effusion.  Given marked peripheral edema, I suspect this may be HFpEF associated.  Although patient reported upper your respiratory symptoms approximately 1 week ago, I do not suspect that this would cause effusions.  Low suspicion for infectious etiology at this time.  Differential also includes malignant associated effusions given history of stage IV breast cancer with new osseous metastasis.  - Management of HFpEF as noted below - If no improvement with diuresis, could consider thoracentesis  Acute on chronic heart failure with preserved ejection fraction (HFpEF) (HCC) Per chart review, patient has a history of chronic HFpEF by cardiology, currently presenting  with atrial fibrillation with RVR, significant lower extremity edema and bilateral pleural effusions concerning for acute on chronic HFpEF exacerbation.  Recent echocardiogram on 07/21/2022 demonstrated improvement in LVEF compared to prior.  I wonder if current exacerbation  may be due to recent surgery versus starting Enhertu.  - Telemetry monitoring - BNP and troponin pending - Start diuresis with IV Lasix 40 mg IV BID - Strict in and out - Daily weights  Malignant neoplasm of upper-outer quadrant of left breast in female, estrogen receptor positive (HCC) Patient has a history of newly progressed stage IV breast cancer currently on Enhertu that was recently started on 07/22/2022.  Patient follows with Dr. Myna Hidalgo  CKD (chronic kidney disease), stage III [facility]) Renal function currently at baseline.  Will monitor closely while IV diuresing.  -Daily BMP  Essential hypertension Blood pressure is well-controlled at this time.  Will monitor closely while on IV diltiazem.  Advance Care Planning:   Code Status: Full Code.  I had a discussion with Mrs. Garraway with her husband, daughter-in-law and son in the room regarding CODE STATUS.  We discussed what CPR entails, including significant damage to the body.  She notes that she has previously signed a DO NOT RESUSCITATE form, but at this time, she does not know which she would want.  Due to this, will remain as full code until she is able to have further discussions with her family.   Consults: None  Family Communication: Patient's husband, son and daughter-in-law updated at bedside  Severity of Illness: The appropriate patient status for this patient is OBSERVATION. Observation status is judged to be reasonable and necessary in order to provide the required intensity of service to ensure the patient's safety. The patient's presenting symptoms, physical exam findings, and initial radiographic and laboratory data in the context of their medical condition is felt to place them at decreased risk for further clinical deterioration. Furthermore, it is anticipated that the patient will be medically stable for discharge from the hospital within 2 midnights of admission.   Author: Verdene Lennert, MD 08/13/2022 8:20 PM  For  on call review [email or web address].

## 2022-08-13 NOTE — Assessment & Plan Note (Signed)
Renal function currently at baseline.  Will monitor closely while IV diuresing.  -Daily BMP

## 2022-08-13 NOTE — Progress Notes (Signed)
Patient here for consideration of treatment, however she had a fall earlier today, and has symptomatic afib. Patient sent to the ED for assessment.   Oncology Nurse Navigator Documentation     08/13/2022   11:45 AM  Oncology Nurse Navigator Flowsheets  Navigator Follow Up Date: 08/17/2022  Navigator Follow Up Reason: Follow-up Appointment  Navigator Location [facility]  Navigator Encounter Type Appt/Treatment Plan Review  Patient Visit Type MedOnc  Treatment Phase Active Tx  Barriers/Navigation Needs Coordination of Care  Interventions None Required  Acuity Level 1-No Barriers  Support Groups/Services Friends and Family  Time Spent with Patient 15

## 2022-08-13 NOTE — Progress Notes (Signed)
Hematology and Oncology Follow Up Visit  Patricia [name] [id number] Jul 08, 1940 82 y.o. 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 Smigielski is back for follow-up from her first treatment of Enhertu and consideration of cycle 2. Now on treatment with Enhertu.   She did have a PET scan completed on 07/04/2022 that showed increased osseous metastasis. There are 5 new areas of disease.  Her tumor is HER2 positive (2+).   She was started on Enhertu on 07/22/2022 which she reports went well.  She recently had a intramedullary nail of her femur placed given a lytic lesions being present and her at risk of spontaneous fracture. They report continued but stable left leg swelling since this time.  Her last CA 27.29 was 34.4 which is down slightly from 47.7 1 month ago.   Today they report that's she had a gentle fall on her way here. They report that she was walking with her walker when she got dizzy after making a turn. She gently fell over without LOC or head injury. She reports some worse than normal SOB. No chest pain. Dizziness is better when she is not ambulating. No neuro changes, slurred speech, facial droop.   She has had no change in bowel or bladder habits.  There is no bleeding.  She has had no headache. No SOB, cough, edema, dizziness, fever.   Overall, I would have said that her performance status is probably ECOG 1.   Wt Readings from Last 3 Encounters:  08/10/22 152 lb 12.8 oz (69.3 kg)  07/28/22 145 lb (65.8 kg)  07/24/22 145 lb (65.8 kg)     Medications:  Current Outpatient Medications:    albuterol (PROVENTIL HFA;VENTOLIN HFA)  108 (90 Base) MCG/ACT inhaler, Inhale 2 puffs into the lungs every 6 (six) hours as needed for wheezing or shortness of breath., Disp: , Rfl:    apixaban (ELIQUIS) 5 MG TABS tablet, Take 1 tablet (5 mg total) by mouth 2 (two) times daily., Disp: 180 tablet, Rfl: 3   Apoaequorin (PREVAGEN) 10 MG CAPS, Take 1 capsule by mouth every morning., Disp: 90 capsule, Rfl: 3   ARMOUR THYROID 15 MG tablet, TAKE 1 TABLET DAILY ALONG WITH 60 MG, Disp: 90 tablet, Rfl: 3   ARMOUR THYROID 60 MG tablet, TAKE 1 TABLET DAILY BEFORE BREAKFAST, Disp: 90 tablet, Rfl: 3   atorvastatin (LIPITOR) 40 MG tablet, Take 1 tablet (40 mg total) by mouth daily before supper., Disp: 90 tablet, Rfl: 3   bimatoprost (LUMIGAN) 0.01 % SOLN, Place 1 drop into both eyes 2 (two) times daily., Disp: , Rfl:    dexamethasone (DECADRON) 4 MG tablet, Take 2 tablets (8 mg) by mouth daily for 3 days starting the day after chemotherapy. Take with food., Disp: 30 tablet, Rfl: 1   diltiazem (CARDIZEM CD) 300 MG 24 hr capsule, Take 1 capsule (300 mg total) by mouth daily., Disp: 90 capsule, Rfl: 3   dorzolamide-timolol (COSOPT) 22.3-6.8 MG/ML ophthalmic solution, Place 1 drop into both eyes at bedtime., Disp: , Rfl:    fluticasone (FLONASE) 50 MCG/ACT nasal spray, 1 spray each nostril following sinus rinses twice daily, Disp: 16 g,  Rfl: 2   fluticasone-salmeterol (ADVAIR HFA) 230-21 MCG/ACT inhaler, USE 2 INHALATIONS TWICE A DAY, Disp: 36 g, Rfl: 3   furosemide (LASIX) 40 MG tablet, TAKE 1 TABLET TWICE A DAY, Disp: 180 tablet, Rfl: 3   gabapentin (NEURONTIN) 400 MG capsule, TAKE 1 CAPSULE TWICE A DAY, Disp: 180 capsule, Rfl: 3   levETIRAcetam (KEPPRA XR) 500 MG 24 hr tablet, Take 1 tablet (500 mg total) by mouth at bedtime., Disp: 90 tablet, Rfl: 3   loratadine (CLARITIN) 10 MG tablet, Take 10 mg by mouth daily., Disp: , Rfl:    memantine (NAMENDA) 10 MG tablet, Take 1 tablet (10 mg total) by mouth 2 (two) times daily., Disp: 180 tablet, Rfl: 3    metoprolol succinate (TOPROL-XL) 50 MG 24 hr tablet, Take 1.5 tablets (75 mg total) by mouth daily. Take with or immediately following a meal., Disp: 135 tablet, Rfl: 2   Multiple Vitamins-Iron (MULTIVITAMIN/IRON) TABS, Take 1 tablet by mouth daily., Disp: , Rfl:    Netarsudil Dimesylate (RHOPRESSA) 0.02 % SOLN, Place 1 drop into both eyes at bedtime., Disp: , Rfl:    ondansetron (ZOFRAN) 8 MG tablet, Take 1 tablet (8 mg total) by mouth every 8 (eight) hours as needed for nausea or vomiting. Start on the third day after chemotherapy., Disp: 30 tablet, Rfl: 1   OVER THE COUNTER MEDICATION, Take 1 tablet by mouth daily. Zyflamend otc herbal supplement, Disp: , Rfl:    pantoprazole (PROTONIX) 40 MG tablet, TAKE 1 TABLET DAILY, Disp: 90 tablet, Rfl: 3   potassium chloride SA (KLOR-CON M) 20 MEQ tablet, TAKE 1 TABLET TWICE A DAY, Disp: 180 tablet, Rfl: 3   prochlorperazine (COMPAZINE) 10 MG tablet, Take 1 tablet (10 mg total) by mouth every 6 (six) hours as needed for nausea or vomiting., Disp: 30 tablet, Rfl: 1   traMADol (ULTRAM) 50 MG tablet, Take 1 tablet (50 mg total) by mouth every 6 (six) hours as needed (post op pain)., Disp: 20 tablet, Rfl: 0   Vitamin D, Ergocalciferol, (DRISDOL) 1.25 MG (50000 UNIT) CAPS capsule, TAKE 1 CAPSULE WEEKLY, Disp: 12 capsule, Rfl: 3   calcium-vitamin D (OSCAL WITH D) 500-200 MG-UNIT tablet, Take 1 tablet by mouth daily with breakfast. (Patient not taking: Reported on 08/13/2022), Disp: 30 tablet, Rfl: 0   L-Methylfolate-B12-B6-B2 (CEREFOLIN) 07-31-48-5 MG TABS, Take 1 tablet by mouth every morning. (Patient not taking: Reported on 08/13/2022), Disp: 90 tablet, Rfl: 3 No current facility-administered medications for this visit.  Facility-Administered Medications Ordered in Other Visits:    acetaminophen (TYLENOL) tablet 650 mg, 650 mg, Oral, Once, Ennever, Rose Phi, MD   dexamethasone (DECADRON) 10 mg in sodium chloride 0.9 % 50 mL IVPB, 10 mg, Intravenous, Once, Ennever,  Rose Phi, MD   dextrose 5 % solution, , Intravenous, Once, Ennever, Rose Phi, MD   diphenhydrAMINE (BENADRYL) capsule 50 mg, 50 mg, Oral, Once, Ennever, Rose Phi, MD   fosaprepitant (EMEND) 150 mg in sodium chloride 0.9 % 145 mL IVPB, 150 mg, Intravenous, Once, Ennever, Rose Phi, MD   palonosetron (ALOXI) injection 0.25 mg, 0.25 mg, Intravenous, Once, Ennever, Rose Phi, MD  Allergies:  Allergies  Allergen Reactions   Combigan [Brimonidine Tartrate-Timolol] Itching    Per patient made eyes red, sore, and sensitivity to light   Sulfa Antibiotics Hives and Rash   Sulfamethoxazole-Trimethoprim Hives    Past Medical History, Surgical history, Social history, and Family History were reviewed and updated.  Review of Systems: Review of Systems  Constitutional:  Positive for fatigue. Negative for appetite change.  HENT:  Negative.    Eyes: Negative.   Respiratory:  Positive for shortness of breath. Negative for chest tightness.   Cardiovascular: Negative.   Gastrointestinal: Negative.   Endocrine: Negative.   Genitourinary: Negative.    Musculoskeletal:  Positive for gait problem.  Skin: Negative.   Neurological:  Positive for dizziness and gait problem. Negative for headaches and light-headedness.  Hematological: Negative.   Psychiatric/Behavioral: Negative.      Physical Exam:  height is 5' (1.524 m). Her oral temperature is 97.8 F (36.6 C). Her blood pressure is 98/68 and her pulse is 100. Her respiration is 20 and oxygen saturation is 99%.   Wt Readings from Last 3 Encounters:  08/10/22 152 lb 12.8 oz (69.3 kg)  07/28/22 145 lb (65.8 kg)  07/24/22 145 lb (65.8 kg)    Physical Exam Vitals reviewed.  HENT:     Head: Normocephalic and atraumatic.  Eyes:     Pupils: Pupils are equal, round, and reactive to light.  Cardiovascular:     Rate and Rhythm: Normal rate and regular rhythm.     Heart sounds: Normal heart sounds.  Pulmonary:     Effort: Pulmonary effort is normal.      Breath sounds: Normal breath sounds.  Abdominal:     General: Bowel sounds are normal.     Palpations: Abdomen is soft.  Musculoskeletal:        General: No tenderness or deformity. Normal range of motion.     Cervical back: Normal range of motion.     Left lower leg: Edema present.     Comments: There is 2+ edema of the left leg throughout. The leg is mildly cooler to touch than the right. 1+ DP of the left leg. No cyanosis of skin. No active bleeding.   Lymphadenopathy:     Cervical: No cervical adenopathy.  Skin:    General: Skin is warm and dry.     Findings: No erythema or rash.  Neurological:     Mental Status: She is alert and oriented to person, place, and time.  Psychiatric:        Behavior: Behavior normal.        Thought Content: Thought content normal.        Judgment: Judgment normal.      Lab Results  Component Value Date   WBC 6.5 08/13/2022   HGB 9.8 (L) 08/13/2022   HCT 30.4 (L) 08/13/2022   MCV 103.8 (H) 08/13/2022   PLT 315 08/13/2022     Chemistry      Component Value Date/Time   NA 138 08/13/2022 1053   NA 143 10/09/2021 1056   NA 141 09/22/2016 1459   K 4.0 08/13/2022 1053   K 3.8 09/22/2016 1459   CL 105 08/13/2022 1053   CO2 25 08/13/2022 1053   CO2 26 09/22/2016 1459   BUN 13 08/13/2022 1053   BUN 17 10/09/2021 1056   BUN 19.8 09/22/2016 1459   CREATININE 1.05 (H) 08/13/2022 1053   CREATININE 1.1 09/22/2016 1459      Component Value Date/Time   CALCIUM 8.7 (L) 08/13/2022 1053   CALCIUM 9.2 09/22/2016 1459   ALKPHOS 98 08/13/2022 1053   ALKPHOS 98 09/22/2016 1459   AST 21 08/13/2022 1053   AST 15 09/22/2016 1459   ALT 27 08/13/2022 1053   ALT 21 09/22/2016 1459   BILITOT 0.7 08/13/2022 1053   BILITOT 0.65 09/22/2016 1459  Impression and Plan: Ms. Lippitt is a very nice 82 year old postmenopausal white female.  She has a solitary metastatic focus from breast cancer.  Her initial breast cancer was probably 7 years ago.  It was  estrogen positive. She now has what Dr. 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  The HER2 was equivocal on initial staining but on FISH was negative.  Has started on Enhertu with goals of avoiding chemotherapy per patient request. She reports that she has tolerated her first cycle of Enhertu well. Plan is to complete 3 or 4 cycles of treatment and then repeat her scan. We will continue to monitor her CA 27.29.   Today I am concerned about her symptoms and vitals. On initial presentation her pulse rate was 149. She has some hypotension and new dizziness. Has history of distant CHF though echo prior to starting Enhertu showed preserved function. Concern about potential A. Fib with RVR. EKG today confirms my suspicions. I discussed my concerns and they are agreeable to take her to the ER for further work up and treatment. Husband wishes to wheel her down in wheelchair. ER notified.   Disposition Discharge to ER- no xgeva today Has follow up on Monday already scheduled -[place]  Rushie Chestnut, PA-C 6/13/202412:03 PM

## 2022-08-13 NOTE — ED Triage Notes (Signed)
Pt sent from PCP for Afib, has history of same, attempt to cardiovert in office x 2 with no result per pt family  Denies any pain or discomfort at this time  Increased fatigue per pt   Takes eliquis bid

## 2022-08-13 NOTE — Treatment Plan (Signed)
[facility] to [facility]  82 yo with hx metastatic breast cancer, recent IM nail of L femur for stabilization of metastatic lesion, hypothyroidism, history of atrial fibrillation on eliquis/diltiazem and multiple other medical issues seen in oncology clinic today.  Was walking with walker and got dizzy after making Seleni Meller turn and "gently fell over".  She was noted to have RVR and send to the ED.  Workup so far notable for EKG with RVR, CXR with cardiomegaly and increased density in LLL field.  Satting well on RA at this time.  BP's reasonable.  Labs notable for anemia which appears chronic.  S/p metop 5 mg IV x3.  Started on dilt gtt by ED as well as abx for possible pneumonia.  Requesting admission for RVR and possible pneumonia.  Requested CT chest.  Will admit to [facility] (given hx Cancer) or [facility] (whichever is available).  EDP discussed with cards, Hochrein who noted they'd be available as needed.

## 2022-08-13 NOTE — Consult Note (Signed)
Patricia [name] is well-known to me.  She is very nice 82 year old white female.  She has metastatic breast cancer.  She has breast cancer that is HER2 positive but ER negative.  She recently had progressive disease.  She had an impending fracture in the left hip.  She underwent surgical or repair of this on 07/28/2022.  She had no problems with her surgery.  She does have a history of atrial fibrillation.  She is on anticoagulation.  She was in the office today for routine follow-up.  She has been taking Enhertu.  She then has had 1 or 2 cycles.  Her CA 27.29 has responded nicely with a level going down to 34.  The office she got dizzy.  She fell.  She did not herself.  She did not lose consciousness.  We an EKG on her.  She is in rapid A-fib.  She subsequently was sent to the ER and then admitted.  She feels good right now.  She seems like her old self.  She has been getting around pretty well after having the surgery.  Her labs look okay.  Sodium 134.  Potassium 3.6.  BUN 12 creatinine 0.99.  Calcium is 8.  The white cell count 6.6.  Hemoglobin 10.3.  Platelet count 324,000.  She has had no chest pain.  She has had no fever.  She did have a chest x-ray which showed an infiltrate in the left lung.  She currently is on Rocephin and Zithromax.  She is on a Cardizem drip.  She has had no bleeding.  There is been no diarrhea.  She has had no leg swelling.  Currently, I would say performance status is probably ECOG 2.     Her vital signs show temperature 98.4.  Pulse 97.  Blood pressure 107/81.  Weight is 152 pounds.  Head and neck exam shows no ocular or oral lesions.  She has no palpable cervical or supraclavicular lymph nodes.  Lungs are clear bilaterally.  I hear no wheezes.  She has decent air movement.  Cardiac exam is tachycardic but irregular.  She has a 1/6 systolic ejection murmur.  Abdomen is soft.  Bowel sounds are present.  There is no fluid wave.  There is no palpable liver or  spleen tip.  Extremity shows a left hip repair to be healing nicely.  She has decent pulses in her lower extremities.  She has maybe a little edema over the left leg.  Neurological exam is nonfocal.   Patricia [name] is a nice 82 year old white female.  She has metastatic breast cancer.  Her tumor is ER negative/HER2 positive.  We currently have her on Enhertu.  Right now, the issue is her atrial fibrillation.  She is in RVR.  She is being managed by the hospitalist and cardiology.  I know they will get her back into rhythm.  The Enhertu really is not associated with arrhythmias.  It is associated with cardiomyopathy.  She has not even close to the cumulative dosing.  There really is not much for [name] to do.  Hopefully, she will not be hospitalized for couple days.  I know that she will get outstanding care from everybody on [address].  Christin Bach, MD  Duwayne Heck 41:10 Pressure currently is on antibiotics.

## 2022-08-13 NOTE — Progress Notes (Signed)
Nutrition Assessment: Reached out to patient at home telephone number.  Spouses did most of talking and they had me on speaker phone.   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  She is under treatment with Enhertu and has undergone one cycle.  She is followed by Dr. Myna Hidalgo.  She has a PMHx that includes recent hospitalization for INTRAMEDULLARY (IM) NAIL FEMORAL on 07/28/2022, Pre DM, CKD, HTN, CVA, HLD, Osteopenia, and lung Ca.  Eating well with few NIS. Bowel movements a little slow currently and she had "a little diarrhea with past chemo cycle."  Watery stools 5-6 times for couple days after Tx.  Spouse also relayed they are getting a meal train through their church so it could have been something she ate.  He admits he is "not much of a cook." Spouse reports patient is eating 3 meals a day with some snacking in-between.       No food allergies.  She doesn't like milk, but will eat cheese and they could try some yogurt as she used to like that.  Usual Intake: Breakfast: toast, Ensure max protein, or Boost  Lunch: Salad or sandwich Dinner: Chili, pot roast, chicken potato Fluids: Ensure BID, Gatorade or sports drink  20-30 , Ginger Ale 6-8, Water 32-40oz  Nutrition Focused Physical Exam: unable to perform NFPE   Medications: Vit D2 weekly, Ca+, MVI,    Labs: 08/10/22  GFR 50, Creat 1.0, Ca+ 7.7, Albumin 3.3, Hgb 9.6, (pre-surgery 12.8).   Anthropometrics: small fluctuations range 144-152# past 6 months  Height: 60" Weight:  08/10/22  152.7# BMI: 29.84   Estimated Energy Needs  Kcals: 2000-2400 Protein: 69-83  Fluid: 2.5 L   NUTRITION DIAGNOSIS:none at this time. Interventions below for educational purposes.   INTERVENTION:   Relayed that nutrition services are wrap around service provided at no charge and encouraged continued  communication if experiencing any nutritional impact symptoms (NIS). Encouraged adequate nourishment with calorie and protein energy intake with nutrient dense foods. Suggested they ask for coupons at [facility] for Ensure oral nutrition supplements. Discussed strategies for diarrhea management. Encouraged they to ask for book Eating Well with Cancer. Emailed Nutrition Tip sheet  for  Nutrition During Cancer Treatment to [email or web address] email with contact information provided. Other email at [facility] is old and not in use.   MONITORING, EVALUATION, GOAL: weight trends, nutrition impact symptoms, PO intake, labs   Next Visit: PRN at patient or provider request.  Gennaro Africa, RDN, LDN Registered Dietitian, [facility] Part Time Remote (Usual office hours: Tuesday-Thursday) Mobile: [phone]

## 2022-08-13 NOTE — Assessment & Plan Note (Addendum)
Per chart review, patient has a history of chronic HFpEF by cardiology, currently presenting with atrial fibrillation with RVR, significant lower extremity edema and bilateral pleural effusions concerning for acute on chronic HFpEF exacerbation.  Recent echocardiogram on 07/21/2022 demonstrated improvement in LVEF compared to prior.  I wonder if current exacerbation may be due to recent surgery versus starting Enhertu.  - Telemetry monitoring - BNP and troponin pending - Start diuresis with IV Lasix 40 mg IV BID - Strict in and out - Daily weights

## 2022-08-13 NOTE — ED Notes (Signed)
ED TO INPATIENT HANDOFF REPORT  ED Nurse Name and Phone #: Maudie Mercury RN  S Name/Age/Gender Patricia [name] 82 y.o. female Room/Bed: MH03/MH03  Code Status   Code Status: Prior  Home/SNF/Other Home Patient oriented to: self, place, time, and situation Is this baseline? Yes   Triage Complete: Triage complete  Chief Complaint Atrial fibrillation with RVR (HCC) [I48.91]  Triage Note Pt sent from PCP for Afib, has history of same, attempt to cardiovert in office x 2 with no result per pt family  Denies any pain or discomfort at this time  Increased fatigue per pt   Takes eliquis bid    Allergies Allergies  Allergen Reactions   Combigan [Brimonidine Tartrate-Timolol] Itching    Per patient made eyes red, sore, and sensitivity to light   Sulfa Antibiotics Hives and Rash   Sulfamethoxazole-Trimethoprim Hives    Level of Care/Admitting Diagnosis ED Disposition     ED Disposition  Admit   Condition  --   Comment  Hospital Area: [facility] [[id number]]  Level of Care: Telemetry [5]  Admit to tele based on following criteria: Complex arrhythmia (Bradycardia/Tachycardia)  Interfacility transfer: Yes  May place patient in observation at [facility] or [facility] if equivalent level of care is available:: No  Covid Evaluation: Asymptomatic - no recent exposure (last 10 days) testing not required  Diagnosis: Atrial fibrillation with RVR [facility]) [[id number]]  Admitting Physician: Zigmund Daniel [phone]  Attending Physician: Shaune Spittle, Vanice Sarah [phone]          B Medical/Surgery History Past Medical History:  Diagnosis Date   Allergic rhinitis, cause unspecified    Aortic regurgitation 06/17/2015   mild by echo 2021   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 Dx 04/2015 at time of stroke. b. DCCV 06/2015 - did not hold. Amio started then stopped due to QT  prolongation.   Chronic cough    Chronic diastolic heart failure (HCC) 06/17/2015   CKD (chronic kidney disease), stage III (HCC)    Cough variant asthma    Dementia (HCC)    Disease of pharynx or nasopharynx    Diverticulosis    Enlargement of right atrium 01/23/2020   Essential hypertension    GERD (gastroesophageal reflux disease)    Glaucoma    Hiatal hernia    Hyperlipidemia    Hypothyroidism    Internal hemorrhoids    Laryngospasm    Mitral regurgitation    mild to moderate by echo 12/2019 and moderate by TEE 12/2020   Multinodular goiter    Osteopenia    Postmenopausal    Pulmonary HTN (HCC)     Moderate with PASP by echo 2018 likely Group 2 from pulmonary venous HTN from CHF>>normal PAP by echo 12/2019   Radiation 10/22/14-11/23/14   Left Breast   Stricture and stenosis of cervix    Stroke (HCC)    TIAs   Past Surgical History:  Procedure Laterality Date   BREAST LUMPECTOMY Left    BREAST SURGERY     CARDIOVERSION N/A 06/24/2015   Procedure: CARDIOVERSION;  Surgeon: Quintella Reichert, MD;  Location: [facility] END[facility];  Service: Cardiovascular;  Laterality: N/A;   CARDIOVERSION N/A 05/19/2016   Procedure: CARDIOVERSION;  Surgeon: Quintella Reichert, MD;  Location: [facility] ENDOS[facility];  Service: Cardiovascular;  Laterality: N/A;   COLONOSCOPY  EYE SURGERY Bilateral    cataract removal by Dr. Burgess Estelle   FEMUR IM NAIL Left 07/28/2022   Procedure: INTRAMEDULLARY (IM) NAIL FEMORAL;  Surgeon: Marcene Corning, MD;  Location: [facility];  Service: Orthopedics;  Laterality: Left;   LUNG CANCER SURGERY  2003   resection carcinoid lingula-lt upper lobe   RADIOACTIVE SEED GUIDED PARTIAL MASTECTOMY WITH AXILLARY SENTINEL LYMPH NODE BIOPSY Left 06/26/2014   Procedure: RADIOACTIVE SEED GUIDED PARTIAL MASTECTOMY WITH AXILLARY SENTINEL LYMPH NODE BIOPSY;  Surgeon: Almond Lint, MD;  Location: [facility];  Service: General;  Laterality: Left;   RE-EXCISION OF BREAST LUMPECTOMY Left  08/07/2014   Procedure: RE-EXCISION OF LEFT BREAST LUMPECTOMY;  Surgeon: Almond Lint, MD;  Location: [facility];  Service: General;  Laterality: Left;   RIGHT HEART CATH N/A 04/27/2016   Procedure: Right Heart Cath;  Surgeon: Laurey Morale, MD;  Location: [facility] INVASIVE CV LAB;  Service: Cardiovascular;  Laterality: N/A;   TEE WITHOUT CARDIOVERSION N/A 01/07/2021   Procedure: TRANSESOPHAGEAL ECHOCARDIOGRAM (TEE);  Surgeon: Vesta Mixer, MD;  Location: [facility] ENDOSCOPY;  Service: Cardiovascular;  Laterality: N/A;     A IV Location/Drains/Wounds Patient Lines/Drains/Airways Status     Active Line/Drains/Airways     Name Placement date Placement time Site Days   Peripheral IV 08/13/22 20 G Right Antecubital 08/13/22  1242  Antecubital  less than 1   Peripheral IV 08/13/22 22 G Anterior;Right Forearm 08/13/22  1616  Forearm  less than 1            Intake/Output Last 24 hours No intake or output data in the 24 hours ending 08/13/22 1653  Labs/Imaging Results for orders placed or performed during the hospital encounter of 08/13/22 (from the past 48 hour(s))  Basic metabolic panel     Status: Abnormal   Collection Time: 08/13/22 12:15 PM  Result Value Ref Range   Sodium 134 (L) 135 - 145 mmol/L   Potassium 3.6 3.5 - 5.1 mmol/L   Chloride 104 98 - 111 mmol/L   CO2 23 22 - 32 mmol/L   Glucose, Bld 100 (H) 70 - 99 mg/dL    Comment: Glucose reference range applies only to samples taken after fasting for at least 8 hours.   BUN 12 8 - 23 mg/dL   Creatinine, Ser 9.14 0.44 - 1.00 mg/dL   Calcium 8.0 (L) 8.9 - 10.3 mg/dL   GFR, Estimated 57 (L) >60 mL/min    Comment: (NOTE) Calculated using the CKD-EPI Creatinine Equation (2021)    Anion gap 7 5 - 15    Comment: Performed at [facility], [address] [place], [place] [address]  CBC     Status: Abnormal   Collection Time: 08/13/22 12:15 PM  Result Value Ref Range   WBC 6.6 4.0 - 10.5 K/uL   RBC 3.04  (L) 3.87 - 5.11 MIL/uL   Hemoglobin 10.3 (L) 12.0 - 15.0 g/dL   HCT 62.1 (L) 30.8 - 65.7 %   MCV 103.9 (H) 80.0 - 100.0 fL   MCH 33.9 26.0 - 34.0 pg   MCHC 32.6 30.0 - 36.0 g/dL   RDW 84.6 (H) 96.2 - 95.2 %   Platelets 324 150 - 400 K/uL   nRBC 0.8 (H) 0.0 - 0.2 %    Comment: Performed at [facility], [address], [place], [place] [address]  Magnesium     Status: None   Collection Time: 08/13/22 12:44 PM  Result Value Ref Range   Magnesium 2.3 1.7 - 2.4 mg/dL    Comment: Performed at [facility], [address] [place], [place] [address]  Urinalysis, Routine w reflex microscopic -Urine, Clean Catch     Status: Abnormal   Collection Time: 08/13/22  3:30 PM  Result Value Ref Range   Color, Urine YELLOW YELLOW   APPearance CLEAR CLEAR   Specific Gravity, Urine 1.015 1.005 - 1.030   pH 7.0 5.0 - 8.0   Glucose, UA NEGATIVE NEGATIVE mg/dL   Hgb urine dipstick NEGATIVE NEGATIVE   Bilirubin Urine NEGATIVE NEGATIVE   Ketones, ur NEGATIVE NEGATIVE mg/dL   Protein, ur NEGATIVE NEGATIVE mg/dL   Nitrite NEGATIVE NEGATIVE   Leukocytes,Ua SMALL (A) NEGATIVE    Comment: Performed at [facility], [address] [place], [place] [address]  Urinalysis, Microscopic (reflex)     Status: Abnormal   Collection Time: 08/13/22  3:30 PM  Result Value Ref Range   RBC / HPF NONE SEEN 0 - 5 RBC/hpf   WBC, UA 6-10 0 - 5 WBC/hpf   Bacteria, UA RARE (A) NONE SEEN   Squamous Epithelial / HPF 0-5 0 - 5 /HPF    Comment: Performed at [facility], [address] [place], [place] [address]   *Note: Due to a large number of results and/or encounters for the requested time period, some results have not been displayed. A complete set of results can be found in Results Review.   DG Chest Port 1 View  Result Date: 08/13/2022 CLINICAL DATA:  Atrial fibrillation EXAM: PORTABLE CHEST 1 VIEW COMPARISON:  Previous chest radiographs done on 02/22/2020, CT chest done on  02/05/2021 FINDINGS: Transverse diameter of heart is increased. There is poor inspiration. There are no signs of alveolar pulmonary edema. There is increased density in left lower lung field. Surgical clips are seen in left chest wall. IMPRESSION: Cardiomegaly. There are no signs of alveolar pulmonary edema. Increased density in left lower lung field may suggest small pleural effusion and underlying atelectasis/pneumonia. Electronically Signed   By: Ernie Avena M.D.   On: 08/13/2022 13:25    Pending Labs Unresulted Labs (From admission, onward)     Start     Ordered   08/13/22 1248  TSH  Add-on,   AD        08/13/22 1248   08/13/22 1248  T4, free  Add-on,   AD        08/13/22 1248            Vitals/Pain Today's Vitals   08/13/22 1530 08/13/22 1615 08/13/22 1645 08/13/22 1647  BP: (!) 134/94 129/88 117/87   Pulse: (!) 111 94 (!) 114 (!) 122  Resp:      Temp:      TempSrc:      SpO2: 94% 97% 95% 95%  Weight:      Height:      PainSc:        Isolation Precautions No active isolations  Medications Medications  azithromycin (ZITHROMAX) 500 mg in sodium chloride 0.9 % 250 mL IVPB (500 mg Intravenous New Bag/Given 08/13/22 1622)  diltiazem (CARDIZEM) 125 mg in dextrose 5% 125 mL (1 mg/mL) infusion (10 mg/hr Intravenous Rate/Dose Change 08/13/22 1648)  metoprolol tartrate (LOPRESSOR) injection 5 mg (5 mg Intravenous Given 08/13/22 1350)  cefTRIAXone (ROCEPHIN) 1 g in sodium chloride 0.9 % 100 mL IVPB (0 g Intravenous Stopped 08/13/22 1621)  Mobility manual wheelchair     Focused Assessments Cardiac Assessment Handoff:  Cardiac Rhythm: Atrial fibrillation Lab Results  Component Value Date   TROPONINI <0.03 04/28/2015   Lab Results  Component Value Date   DDIMER 0.44 04/27/2015   Does the Patient currently have chest pain? No    R Recommendations: See Admitting Provider Note  Report given to:   Additional Notes:

## 2022-08-13 NOTE — ED Notes (Signed)
Signing pad not working. Pt and husband give transfer consent to [facility]

## 2022-08-13 NOTE — ED Provider Notes (Signed)
Discussed with Dr. Antoine Poche.  He agrees that with the other factors just to start her on diltiazem drip and get her admitted by medicine.  They do not need to be involved unless there is difficulty controlling her rate because she get a known history of atrial fibs.  Patient on cardiac monitor would go anywhere from 110 all the way up to 138 even after having the 3 doses of Lopressor.  Will just start her on diltiazem drip and have them titrate.  Patient's oxygen sats at times will drop.  Seems to be reasonable waveform so we will start her on 2 L of oxygen.  Chest x-ray raise concerns for pneumonia or pleural effusion patient started on Rocephin and Zithromax for that.  Discussed with Dr. Myna Hidalgo on-call for oncology and patient's oncologist.  He agrees with the admission and they will see her in consultation.  Hospitalist will get her [facility].  CRITICAL CARE Performed by: Vanetta Mulders Total critical care time: 35 minutes Critical care time was exclusive of separately billable procedures and treating other patients. Critical care was necessary to treat or prevent imminent or life-threatening deterioration. Critical care was time spent personally by me on the following activities: development of treatment plan with patient and/or surrogate as well as nursing, discussions with consultants, evaluation of patient's response to treatment, examination of patient, obtaining history from patient or surrogate, ordering and performing treatments and interventions, ordering and review of laboratory studies, ordering and review of radiographic studies, pulse oximetry and re-evaluation of patient's condition.    Vanetta Mulders, MD 08/13/22 [phone]

## 2022-08-13 NOTE — ED Notes (Signed)
[facility] at bedside 

## 2022-08-13 NOTE — ED Notes (Signed)
Pulse varies between 105-118 after metoprolol

## 2022-08-13 NOTE — Assessment & Plan Note (Signed)
Patient has a history of newly progressed stage IV breast cancer currently on Enhertu that was recently started on 07/22/2022.  Patient follows with Dr. Myna Hidalgo

## 2022-08-14 ENCOUNTER — Encounter (HOSPITAL_COMMUNITY): Payer: Self-pay | Admitting: Family Medicine

## 2022-08-14 DIAGNOSIS — J45909 Unspecified asthma, uncomplicated: Secondary | ICD-10-CM | POA: Diagnosis present

## 2022-08-14 DIAGNOSIS — I4821 Permanent atrial fibrillation: Secondary | ICD-10-CM | POA: Diagnosis present

## 2022-08-14 DIAGNOSIS — C50412 Malignant neoplasm of upper-outer quadrant of left female breast: Secondary | ICD-10-CM | POA: Diagnosis present

## 2022-08-14 DIAGNOSIS — Z515 Encounter for palliative care: Secondary | ICD-10-CM | POA: Diagnosis not present

## 2022-08-14 DIAGNOSIS — E785 Hyperlipidemia, unspecified: Secondary | ICD-10-CM | POA: Diagnosis present

## 2022-08-14 DIAGNOSIS — I952 Hypotension due to drugs: Secondary | ICD-10-CM | POA: Diagnosis not present

## 2022-08-14 DIAGNOSIS — J9811 Atelectasis: Secondary | ICD-10-CM | POA: Diagnosis present

## 2022-08-14 DIAGNOSIS — I4891 Unspecified atrial fibrillation: Secondary | ICD-10-CM | POA: Diagnosis present

## 2022-08-14 DIAGNOSIS — G4733 Obstructive sleep apnea (adult) (pediatric): Secondary | ICD-10-CM | POA: Diagnosis present

## 2022-08-14 DIAGNOSIS — I5033 Acute on chronic diastolic (congestive) heart failure: Secondary | ICD-10-CM | POA: Diagnosis present

## 2022-08-14 DIAGNOSIS — C7951 Secondary malignant neoplasm of bone: Secondary | ICD-10-CM | POA: Diagnosis present

## 2022-08-14 DIAGNOSIS — D6489 Other specified anemias: Secondary | ICD-10-CM | POA: Diagnosis present

## 2022-08-14 DIAGNOSIS — T461X5A Adverse effect of calcium-channel blockers, initial encounter: Secondary | ICD-10-CM | POA: Diagnosis not present

## 2022-08-14 DIAGNOSIS — I083 Combined rheumatic disorders of mitral, aortic and tricuspid valves: Secondary | ICD-10-CM | POA: Diagnosis present

## 2022-08-14 DIAGNOSIS — R0902 Hypoxemia: Secondary | ICD-10-CM | POA: Diagnosis present

## 2022-08-14 DIAGNOSIS — E876 Hypokalemia: Secondary | ICD-10-CM | POA: Diagnosis not present

## 2022-08-14 DIAGNOSIS — I272 Pulmonary hypertension, unspecified: Secondary | ICD-10-CM | POA: Diagnosis present

## 2022-08-14 DIAGNOSIS — Z66 Do not resuscitate: Secondary | ICD-10-CM | POA: Diagnosis present

## 2022-08-14 DIAGNOSIS — Z7189 Other specified counseling: Secondary | ICD-10-CM | POA: Diagnosis not present

## 2022-08-14 DIAGNOSIS — Z170 Estrogen receptor positive status [ER+]: Secondary | ICD-10-CM | POA: Diagnosis not present

## 2022-08-14 DIAGNOSIS — E049 Nontoxic goiter, unspecified: Secondary | ICD-10-CM | POA: Diagnosis present

## 2022-08-14 DIAGNOSIS — M8580 Other specified disorders of bone density and structure, unspecified site: Secondary | ICD-10-CM | POA: Diagnosis present

## 2022-08-14 DIAGNOSIS — I429 Cardiomyopathy, unspecified: Secondary | ICD-10-CM | POA: Diagnosis present

## 2022-08-14 DIAGNOSIS — J90 Pleural effusion, not elsewhere classified: Secondary | ICD-10-CM | POA: Diagnosis not present

## 2022-08-14 DIAGNOSIS — N1831 Chronic kidney disease, stage 3a: Secondary | ICD-10-CM | POA: Diagnosis present

## 2022-08-14 DIAGNOSIS — I130 Hypertensive heart and chronic kidney disease with heart failure and stage 1 through stage 4 chronic kidney disease, or unspecified chronic kidney disease: Secondary | ICD-10-CM | POA: Diagnosis present

## 2022-08-14 DIAGNOSIS — E039 Hypothyroidism, unspecified: Secondary | ICD-10-CM | POA: Diagnosis present

## 2022-08-14 DIAGNOSIS — J189 Pneumonia, unspecified organism: Secondary | ICD-10-CM | POA: Diagnosis present

## 2022-08-14 DIAGNOSIS — F0390 Unspecified dementia without behavioral disturbance: Secondary | ICD-10-CM | POA: Diagnosis present

## 2022-08-14 LAB — CBC
HCT: 29.6 % — ABNORMAL LOW (ref 36.0–46.0)
Hemoglobin: 9.4 g/dL — ABNORMAL LOW (ref 12.0–15.0)
MCH: 33.7 pg (ref 26.0–34.0)
MCHC: 31.8 g/dL (ref 30.0–36.0)
MCV: 106.1 fL — ABNORMAL HIGH (ref 80.0–100.0)
Platelets: 282 10*3/uL (ref 150–400)
RBC: 2.79 MIL/uL — ABNORMAL LOW (ref 3.87–5.11)
RDW: 20.5 % — ABNORMAL HIGH (ref 11.5–15.5)
WBC: 3.9 10*3/uL — ABNORMAL LOW (ref 4.0–10.5)
nRBC: 0.5 % — ABNORMAL HIGH (ref 0.0–0.2)

## 2022-08-14 LAB — MAGNESIUM: Magnesium: 2.4 mg/dL (ref 1.7–2.4)

## 2022-08-14 LAB — BASIC METABOLIC PANEL
Anion gap: 8 (ref 5–15)
BUN: 11 mg/dL (ref 8–23)
CO2: 23 mmol/L (ref 22–32)
Calcium: 7.5 mg/dL — ABNORMAL LOW (ref 8.9–10.3)
Chloride: 108 mmol/L (ref 98–111)
Creatinine, Ser: 0.82 mg/dL (ref 0.44–1.00)
GFR, Estimated: >60 mL/min (ref >60)
Glucose, Bld: 101 mg/dL — ABNORMAL HIGH (ref 70–99)
Potassium: 3.2 mmol/L — ABNORMAL LOW (ref 3.5–5.1)
Sodium: 139 mmol/L (ref 135–145)

## 2022-08-14 MED ORDER — ORAL CARE MOUTH RINSE: 15.0000 mL | OROMUCOSAL | Status: DC | PRN

## 2022-08-14 MED ORDER — METOPROLOL TARTRATE 25 MG PO TABS
25.0000 mg | ORAL_TABLET | Freq: Four times a day (QID) | ORAL | Status: DC
  Administered 2022-08-14 – 2022-08-16 (×9): 25 mg via ORAL
  Filled 2022-08-14 (×9): qty 1

## 2022-08-14 MED ORDER — DIGOXIN 125 MCG PO TABS
0.1250 mg | ORAL_TABLET | Freq: Every day | ORAL | Status: DC
  Administered 2022-08-15 – 2022-08-16 (×2): 0.125 mg via ORAL
  Filled 2022-08-14 (×2): qty 1

## 2022-08-14 MED ORDER — DIGOXIN 0.25 MG/ML IJ SOLN
0.2500 mg | Freq: Once | INTRAMUSCULAR | Status: AC
  Administered 2022-08-14: 0.25 mg via INTRAVENOUS
  Filled 2022-08-14: qty 2

## 2022-08-14 MED ORDER — POTASSIUM CHLORIDE CRYS ER 20 MEQ PO TBCR
40.0000 meq | EXTENDED_RELEASE_TABLET | ORAL | Status: AC
  Administered 2022-08-14 (×2): 40 meq via ORAL
  Filled 2022-08-14 (×2): qty 2

## 2022-08-14 NOTE — Progress Notes (Addendum)
PROGRESS NOTE    Patricia [name]  ZOX:[id number] DOB: [birth year] DOA: 08/13/2022 PCP: [name], PA   Brief Narrative:  82 y.o. 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  Assessment & Plan:   Permanent A-fib with RVR -Currently on Cardizem drip along with Toprol-XL.  Still intermittently tachycardic.  I have consulted cardiology.  Follow recommendations.  Continue apixaban  Acute on chronic diastolic CHF Hypotension History of hypertension  mild to moderate mitral valve regurgitation and moderate to severe tricuspid valve regurgitation and mild to moderate aortic valve regurgitation.  Hyperlipidemia -Echo on 07/21/2022 had shown EF of 60 to 65% with mild to moderate mitral valve regurgitation and moderate to severe tricuspid valve regurgitation and mild to moderate aortic valve regurgitation.  - Currently on IV Lasix 40 mg twice a day along with oral metoprolol.  Strict input and output.  Daily weights.  Fluid restriction.  Blood pressure intermittently on the lower side.  Might have to hold next dose of all the antihypertensives.  Follow cardiology recommendations. -Continue statin  Bilateral pleural effusion -Left more than right on CT imaging -Most likely from CHF exacerbation.  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  Oncology following.  CKD stage IIIa -Creatinine stable.  Hypokalemia -Replace.  Repeat a.m. labs  Hypothyroidism -Continue  replacement  History of recent left femur trochanteric nail placement for left femur metastasis -Had surgical intervention on 07/28/2022 by orthopedics.  Wound healing well.  Outpatient follow-up with orthopedics.  PT eval.  Goals of care -Prognosis is guarded to poor.  Consult palliative care for goals of care discussion.   -Discussed with husband on phone who indicated that patient/family does not want any CPR or intubation for the patient but are okay with shock for A-fib/cardiac arrest if needed   DVT prophylaxis: Eliquis Code Status: No CPR or intubation; ok with shock for Afib/cardiac arrest as per husband on phone Family Communication: Husband on phone Disposition Plan: Status is: Observation The patient will require care spanning > 2 midnights and should be moved to inpatient because: Of severity of illness.  Still on Cardizem drip and IV Lasix.  Will need cardiology evaluation    Consultants: Cardiology.  Palliative care  Procedures: None  Antimicrobials: None   Subjective: Patient seen and examined at bedside.  Feels slightly better but still short of breath with exertion.  Denies any current chest pain.  No fever or vomiting reported.  Objective: Vitals:   08/14/22 0627 08/14/22 0645 08/14/22 0919 08/14/22 1025  BP: (!) 91/58 105/62 (!) 88/58 98/81  Pulse: 74 98 (!) 114 (!) 140  Resp: 20 (!) 23    Temp: 98.1 F (36.7 C)     TempSrc: Oral     SpO2: 94% 93%  (!) 86%  Weight:      Height:        Intake/Output Summary (Last 24 hours) at 08/14/2022 1048 Last data filed at 08/14/2022 0300 Gross per 24 hour  Intake 263.92 ml  Output 1550 ml  Net -1286.08 ml   Filed Weights   08/13/22 1211  Weight: 69.3  kg    Examination:  General exam: Appears calm and comfortable.  Looks chronically ill and deconditioned. Respiratory system: Bilateral decreased breath sounds at bases with scattered crackles Cardiovascular system: S1 & S2 heard,  tachycardic Gastrointestinal system: Abdomen is nondistended, soft and nontender. Normal bowel sounds heard. Extremities: No cyanosis, clubbing; bilateral lower extremity edema present, more on the left side  Central nervous system: Alert and oriented. No focal neurological deficits. Moving extremities Skin: Left lower extremity bruising present  psychiatry: Judgement and insight appear normal. Mood & affect appropriate.     Data Reviewed: I have personally reviewed following labs and imaging studies  CBC: Recent Labs  Lab 08/10/22 1052 08/13/22 1053 08/13/22 1215 08/14/22 0406  WBC 5.1 6.5 6.6 3.9*  NEUTROABS 3.1 4.9  --   --   HGB 9.6* 9.8* 10.3* 9.4*  HCT 30.3* 30.4* 31.6* 29.6*  MCV 102.7* 103.8* 103.9* 106.1*  PLT 307 315 324 282   Basic Metabolic Panel: Recent Labs  Lab 08/10/22 1052 08/13/22 1053 08/13/22 1215 08/13/22 1244 08/14/22 0406  NA 138 138 134*  --  139  K 3.9 4.0 3.6  --  3.2*  CL 108 105 104  --  108  CO2 21* 25 23  --  23  GLUCOSE 109* 102* 100*  --  101*  BUN 13 13 12   --  11  CREATININE 1.10* 1.05* 0.99  --  0.82  CALCIUM 7.7* 8.7* 8.0*  --  7.5*  MG  --   --   --  2.3 2.4   GFR: Estimated Creatinine Clearance: 46.7 mL/min (by C-G formula based on SCr of 0.82 mg/dL). Liver Function Tests: Recent Labs  Lab 08/10/22 1052 08/13/22 1053  AST 26 21  ALT 34 27  ALKPHOS 80 98  BILITOT 0.7 0.7  PROT 5.6* 5.7*  ALBUMIN 3.3* 3.4*   No results for input(s): "LIPASE", "AMYLASE" in the last 168 hours. No results for input(s): "AMMONIA" in the last 168 hours. Coagulation Profile: No results for input(s): "INR", "PROTIME" in the last 168 hours. Cardiac Enzymes: No results for input(s): "CKTOTAL", "CKMB", "CKMBINDEX", "TROPONINI" in the last 168 hours. BNP (last 3 results) No results for input(s): "PROBNP" in the last 8760 hours. HbA1C: No results for input(s): "HGBA1C" in the last 72 hours. CBG: No results for input(s): "GLUCAP" in the last  168 hours. Lipid Profile: No results for input(s): "CHOL", "HDL", "LDLCALC", "TRIG", "CHOLHDL", "LDLDIRECT" in the last 72 hours. Thyroid Function Tests: Recent Labs    08/13/22 1244  TSH 5.612*  FREET4 0.55*   Anemia Panel: Recent Labs    08/13/22 1054 08/13/22 1055  VITAMINB12 1,618*  --   FOLATE >40.0  --   FERRITIN 175  --   TIBC  --  255  IRON  --  73  RETICCTPCT 8.4*  --    Sepsis Labs: No results for input(s): "PROCALCITON", "LATICACIDVEN" in the last 168 hours.  No results found for this or any previous visit (from the past 240 hour(s)).       Radiology Studies: CT Chest W Contrast  Result Date: 08/13/2022 CLINICAL DATA:  Pneumonia, complication suspected, xray done EXAM: CT CHEST WITH CONTRAST TECHNIQUE: Multidetector CT imaging of the chest was performed during intravenous contrast administration. RADIATION DOSE REDUCTION: This exam was performed according to the departmental dose-optimization program which includes automated exposure control, adjustment of the mA and/or kV according to patient size and/or use of iterative reconstruction technique. CONTRAST:  80mL OMNIPAQUE IOHEXOL 300  MG/ML  SOLN COMPARISON:  Radiograph earlier today.  PET CT 07/02/2022 FINDINGS: Cardiovascular: Multi chamber cardiomegaly, with primarily biatrial enlargement. Moderate atherosclerosis of the thoracic aorta, no dissection. There is no pulmonary embolus, excellent pulmonary arterial opacification on this non dedicated arterial exam. No pericardial effusion. Mediastinum/Nodes: Small hiatal hernia. Scattered small mediastinal nodes are all subcentimeter, not enlarged by size criteria. No axillary adenopathy, prior left axillary node dissection. Lungs/Pleura: There are small right and moderate left pleural effusion. The left pleural effusion has increased from prior PET. The right pleural effusion is new. No convincing pleural nodularity or enhancement. Adjacent compressive as well as bandlike  atelectasis. Treatment related changes in the left anterior hemithorax. No airspace disease to suggest pneumonia. No suspicious pulmonary nodule or mass. Trachea and central airways are clear. Upper Abdomen: Mild body wall edema in the upper abdomen. Fat containing left adrenal nodule measuring 2 cm, unchanged and not hypermetabolic on PET. Musculoskeletal: The metastatic disease involving the left clavicular head on prior PET is only faintly visualized with slight cortical thinning on CT. There is slight heterogeneity of the left anterior T9 rib. Multilevel degenerative change in the spine. Postsurgical change in the left breast. IMPRESSION: 1. Small right and moderate left pleural effusions. The left pleural effusion has increased from prior PET. The right pleural effusion is new. 2. Adjacent compressive as well as bandlike atelectasis. No airspace disease to suggest pneumonia. 3. Multi chamber cardiomegaly, with primarily biatrial enlargement. 4. The metastatic disease involving the left clavicular head on prior PET is only faintly visualized with slight cortical thinning on CT. There is slight heterogeneity of the left anterior T9 rib. Aortic Atherosclerosis (ICD10-I70.0). Electronically Signed   By: Narda Rutherford M.D.   On: 08/13/2022 17:41   DG Chest Port 1 View  Result Date: 08/13/2022 CLINICAL DATA:  Atrial fibrillation EXAM: PORTABLE CHEST 1 VIEW COMPARISON:  Previous chest radiographs done on 02/22/2020, CT chest done on 02/05/2021 FINDINGS: Transverse diameter of heart is increased. There is poor inspiration. There are no signs of alveolar pulmonary edema. There is increased density in left lower lung field. Surgical clips are seen in left chest wall. IMPRESSION: Cardiomegaly. There are no signs of alveolar pulmonary edema. Increased density in left lower lung field may suggest small pleural effusion and underlying atelectasis/pneumonia. Electronically Signed   By: Ernie Avena M.D.   On:  08/13/2022 13:25        Scheduled Meds:  apixaban  5 mg Oral BID   atorvastatin  40 mg Oral QAC supper   dorzolamide  1 drop Both Eyes QHS   And   timolol  1 drop Both Eyes QHS   furosemide  40 mg Intravenous Q12H   gabapentin  400 mg Oral BID   latanoprost  1 drop Both Eyes QHS   levETIRAcetam  500 mg Oral QHS   metoprolol succinate  75 mg Oral Daily   mometasone-formoterol  2 puff Inhalation BID   Netarsudil Dimesylate  1 drop Both Eyes QHS   potassium chloride  40 mEq Oral Q4H   thyroid  75 mg Oral QAC breakfast   Continuous Infusions:  diltiazem (CARDIZEM) infusion Stopped (08/14/22 8295)          Glade Lloyd, MD [facility] 08/14/2022, 10:48 AM

## 2022-08-14 NOTE — Evaluation (Signed)
Occupational Therapy Evaluation Patient Details Name: Patricia [name] MRN: [id number] DOB: 1940/07/06 Today's Date: 08/14/2022   History of Present Illness 82 y.o. female admitted on 08/13/22 after she was walking to her oncologist office, and had sudden onset dizziness and slowly slumped down to the ground but did not lose consciousness or hit her head.  PMH: afib, lung cancer, metastatic breast cancer, CKD, 07/28/22 for prophylactic IM nail L femur 2* metastatic lesion.   Clinical Impression   Patient is currently requiring assistance with ADLs including up to maximum assist with Lower body ADLs, setup assist with Upper body ADLs,  as well as  min guard assist with bed mobility and min guard assist with functional transfers to toilet.  Current level of function is below patient's typical baseline.  During this evaluation, patient was limited by generalized weakness, impaired activity tolerance with low BP, active A-fib and HR as high as 140s with SpO2 as low as 86% on room air while standing at sink but with recovery with standing rest breaks and cues for breathing. Further impairments involved pt's recent LT hip IM nailing and subsequent balance impairments.  Limitations have the potential to impact patient's safety and independence during functional mobility, as well as performance for ADLs.  Patient lives at home, with her spouse who is able to provide 24/7 supervision and assistance and has been assisting with shower transfers and LE dressing since pt's surgery, as well as all IADLs.  Patient demonstrates good rehab potential, and should benefit from continued skilled occupational therapy services while in acute care to maximize safety, independence and quality of life at home.  Continued occupational therapy services are recommended.  ?      Recommendations for follow up therapy are one component of a multi-disciplinary discharge planning process, led by the attending physician.  Recommendations may  be updated based on patient status, additional functional criteria and insurance authorization.   Assistance Recommended at Discharge Frequent or constant Supervision/Assistance  Patient can return home with the following A little help with bathing/dressing/bathroom;A little help with walking and/or transfers;Direct supervision/assist for medications management;Direct supervision/assist for financial management;Assistance with cooking/housework;Assist for transportation;Help with stairs or ramp for entrance    Functional Status Assessment  Patient has had a recent decline in their functional status and demonstrates the ability to make significant improvements in function in a reasonable and predictable amount of time.  Equipment Recommendations  Other (comment) (Reacher, sock aid, long shoe horn)    Recommendations for Other Services       Precautions / Restrictions Precautions Precautions: Fall Precaution Comments: Mon all vitals Restrictions Weight Bearing Restrictions: Yes LLE Weight Bearing: Weight bearing as tolerated      Mobility Bed Mobility Overal bed mobility: Needs Assistance Bed Mobility: Supine to Sit     Supine to sit: HOB elevated, Min guard          Transfers                          Balance Overall balance assessment: Needs assistance, History of Falls Sitting-balance support: Feet supported, No upper extremity supported Sitting balance-Leahy Scale: Good     Standing balance support: During functional activity, Single extremity supported Standing balance-Leahy Scale: Fair                             ADL either performed or assessed with clinical judgement   ADL Overall ADL's :  Needs assistance/impaired Eating/Feeding: Independent   Grooming: Standing;Supervision/safety Grooming Details (indicate cue type and reason): Cues for rest breaks and breathing as needed based on vitals. Upper Body Bathing: Set up;Supervision/  safety;Sitting   Lower Body Bathing: Minimal assistance;Sit to/from stand;Sitting/lateral leans Lower Body Bathing Details (indicate cue type and reason): Assist needed due to pt fatigue. Upper Body Dressing : Set up;Sitting   Lower Body Dressing: Maximal assistance;Sitting/lateral leans Lower Body Dressing Details (indicate cue type and reason): For socks, due to pt fatigue. Toilet Transfer: Comfort height toilet;Ambulation;Min guard;Rolling walker (2 wheels);Supervision/safety Toilet Transfer Details (indicate cue type and reason): Pt stood from EOB to RW with Min guard. Ambulated to bathroom with Min guard and cues for walker proximity. Descended to toilet with close suervision and stood from toilet with close supervision. Toileting- Clothing Manipulation and Hygiene: Minimal assistance;Sitting/lateral lean;Sit to/from stand Toileting - Clothing Manipulation Details (indicate cue type and reason): For clothing management     Functional mobility during ADLs: Min guard;Rolling walker (2 wheels)       Vision   Vision Assessment?: No apparent visual deficits     Perception     Praxis      Pertinent Vitals/Pain Pain Assessment Pain Assessment: No/denies pain Pain Intervention(s): Monitored during session     Hand Dominance Right   Extremity/Trunk Assessment Upper Extremity Assessment Upper Extremity Assessment: Overall WFL for tasks assessed   Lower Extremity Assessment Lower Extremity Assessment: Defer to PT evaluation   Cervical / Trunk Assessment Cervical / Trunk Assessment: Normal   Communication Communication Communication: No difficulties   Cognition Arousal/Alertness: Awake/alert Behavior During Therapy: WFL for tasks assessed/performed Overall Cognitive Status: Within Functional Limits for tasks assessed                                       General Comments       Exercises     Shoulder Instructions      Home Living Family/patient  expects to be discharged to:: Private residence Living Arrangements: Spouse/significant other Available Help at Discharge: Family;Available 24 hours/day Type of Home: House Home Access: Stairs to enter [employer] of Steps: 3 Entrance Stairs-Rails: Right Home Layout: One level     Bathroom Shower/Tub: Producer, television/film/video: Handicapped height     Home Equipment: Scientist, physiological Equipment: Long-handled sponge        Prior Functioning/Environment Prior Level of Function : Needs assist;History of Falls (last six months)       Physical Assist : ADLs (physical)   ADLs (physical): IADLs;Bathing Mobility Comments: Has been using RW since surgery. ADLs Comments: Ind prior to her hip surgery on 07/28/22. Since that time, husband has been performing all cooking, cleaning, laundry, driving and sets up pt's medication.  Both pt and husband enjoy golfing.        OT Problem List: Decreased activity tolerance;Decreased strength;Decreased knowledge of use of DME or AE;Impaired balance (sitting and/or standing);Cardiopulmonary status limiting activity      OT Treatment/Interventions: Self-care/ADL training;Therapeutic activities;Energy conservation;Patient/family education;Balance training;DME and/or AE instruction    OT Goals(Current goals can be found in the care plan section) Acute Rehab OT Goals Patient Stated Goal: Per spouse, for pt to be able to tolerate some light kitchen activities. OT Goal Formulation: With patient/family Time For Goal Achievement: 08/28/22 Potential to Achieve Goals: Good ADL Goals Pt Will Perform Grooming: standing;with modified independence (completing 3/3 with  VSS) Pt Will Perform Lower Body Dressing: with modified independence;with adaptive equipment;sit to/from stand;sitting/lateral leans Pt Will Transfer to Toilet: ambulating;with modified independence;regular height toilet Pt Will Perform Toileting - Clothing  Manipulation and hygiene: with modified independence;with adaptive equipment;sitting/lateral leans;sit to/from stand Additional ADL Goal #1: Patient will identify at least 3 energy conservation strategies to employ at home in order to maximize function and quality of life and decrease caregiver burden while preventing exacerbation of symptoms and rehospitalization.  OT Frequency: Min 1X/week    Co-evaluation              AM-PAC OT "6 Clicks" Daily Activity     Outcome Measure Help from another person eating meals?: None Help from another person taking care of personal grooming?: A Little Help from another person toileting, which includes using toliet, bedpan, or urinal?: A Little Help from another person bathing (including washing, rinsing, drying)?: A Lot Help from another person to put on and taking off regular upper body clothing?: A Little Help from another person to put on and taking off regular lower body clothing?: A Lot 6 Click Score: 17   End of Session Equipment Utilized During Treatment: Gait belt;Rolling walker (2 wheels) Nurse Communication: Other (comment) (RN to room to discuss vitals and monitoring BP as it has been low.)  Activity Tolerance: Patient limited by fatigue Patient left: in bed;with call bell/phone within reach;with bed alarm set;with family/visitor present  OT Visit Diagnosis: Unsteadiness on feet (R26.81);History of falling (Z91.81);Dizziness and giddiness (R42)                Time: 4098-1191 OT Time Calculation (min): 40 min Charges:  OT General Charges $OT Visit: 1 Visit OT Evaluation $OT Eval Low Complexity: 1 Low OT Treatments $Self Care/Home Management : 8-22 mins $Therapeutic Activity: 8-22 mins  Victorino Dike, OT Acute Rehab Services Office: [phone] 08/14/2022  Theodoro Clock 08/14/2022, 10:37 AM

## 2022-08-14 NOTE — Consult Note (Addendum)
Cardiology Consultation   Patient ID: Patricia [name] MRN: [id number]; DOB: 09/29/1940  Admit date: 08/13/2022 Date of Consult: 08/14/2022  PCP:  Patricia Quitter, PA   [facility] Providers Cardiologist:  Armanda Magic, MD  Electrophysiologist:  Regan Lemming, MD       Patient Profile:   Patricia [name] is a 82 y.o. female with a hx of permanent atrial fibrillation, mild OSA, pulmonary hypertension, MR, hypertension, hyperlipidemia, history of CVA, chronic diastolic heart failure, history of lung cancer s/p resection 2003 and metastatic breast cancer s/p lumpectomy/chemo/radiation therapy who is being seen 08/14/2022 for the evaluation of CHF and permanent afib with RVR at the request of Dr. Hanley Ben.  History of Present Illness:   Ms. Much is a pleasant 82 year old female with past medical history of permanent atrial fibrillation, mild OSA, pulmonary hypertension, MR, hypertension, hyperlipidemia, history of CVA, chronic diastolic heart failure, history of lung cancer s/p resection 2003 and metastatic breast cancer s/p lumpectomy/chemo/radiation therapy.  She had prolonged QT on amiodarone therapy.  She could not hold sinus rhythm despite cardioversion due to severely dilated left atrium.  At home she is on Eliquis, Cardizem CD3 100 mg daily and Toprol-XL 75 mg daily.  She has been on BiPAP at home due to mild obstructive sleep apnea.  Echocardiogram in 2018 showed moderate pulm hypertension with PASP 52 mmHg.  Right heart cath in February 2018 showed borderline pulmonary hypertension.  TEE performed on 01/07/2021 showed low normal LV function with EF 50 to 55%, severe LAE, severe right atrial enlargement, moderate to severe mitral regurgitation with myxomatous tricuspid valve and moderate TR.  Patient was referred to structural heart clinic and was seen by Dr. Excell Seltzer who felt she had moderate to severe 3+ MR due to combination of functional MR with marked left atrial enlargement and  degenerative mitral valve regurgitation with severe calcification of the P2 scallop of posterior leaflet.  She was found not to be candidate for transcatheter therapy due to severe posterior leaflet calcification.  Medical therapy was recommended given lack of symptom.  Echocardiogram obtained on 01/15/2022 showed EF 50 to 55%, severe biatrial enlargement, moderate MR, severe TR, mild to moderate AI.  She was last seen by Dr. Mayford Knife on 03/30/2022 at which time she was doing well.  Blood pressure was 116/62 at the time, heart rate 76.  Repeat outpatient echocardiogram was recommended.  Echocardiogram was repeated on 07/21/2022 which showed EF 60 to 65%, severe biatrial enlargement, mild to moderate MR, moderate to severe TR, mild to moderate AI.  She has underwent radiation therapy to ninth left rib and L5 vertebral body last year due to bone metastasis.  Recent PET scan showed increased osseous metastasis.  She underwent intramedullary nailing of her left femur due to lytic lesions in present and her risk of spontaneous fracture.  Postprocedure, she has been able to walk around with a walker.  She remained anemic after the surgery.  Prior to the surgery, her hemoglobin was 12.8, since the surgery, her hemoglobin has been around 9.5.  She says she has been compliant with her medication at home and is somewhat independent living with her husband.  Patient was in her usual state of health until 08/13/2022 when she presented for her oncology appointment.  While at the oncologist office, she was very weak and reportedly had a gentle fall.  Heart rate was around 150 bpm.  She was in atrial fibrillation with RVR.  Blood pressure was 98/68.  Patient was  subsequently sent to [facility] for further evaluation of atrial fibrillation with RVR.  Initial blood work showed hemoglobin of 9.8, increased retic count, creatinine 1.05, albumin 3.4.  CT of chest with contrast showed small right and moderate left pleural  effusion, left pleural effusion has increased from the previous PET scan, no airspace disease to suggest pneumonia, multichamber cardiomegaly, metastatic disease involving the left clavicle head and the left anterior T9 rib.  Per home oral Cardizem has been transitioned to IV Cardizem.  She was felt to be mildly volume overloaded and was started on IV Lasix 40 mg twice a day.  Talking with the patient, her breathing has been shallow for the past week.   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 Dx 04/2015 at time of stroke. b. DCCV 06/2015 - did not hold. Amio started then stopped due to QT prolongation.   Chronic cough    Chronic diastolic heart failure (HCC) 06/17/2015   CKD (chronic kidney disease), stage III (HCC)    Cough variant asthma    Dementia (HCC)    Disease of pharynx or nasopharynx    Diverticulosis    Enlargement of right atrium 01/23/2020   Essential hypertension    GERD (gastroesophageal reflux disease)    Glaucoma    Hiatal hernia    Hyperlipidemia    Hypothyroidism    Internal hemorrhoids    Laryngospasm    Mitral regurgitation    mild to moderate by echo 12/2019 and moderate by TEE 12/2020   Multinodular goiter    Osteopenia    Postmenopausal    Pulmonary HTN (HCC)     Moderate with PASP by echo 2018 likely Group 2 from pulmonary venous HTN from CHF>>normal PAP by echo 12/2019   Radiation 10/22/14-11/23/14   Left Breast   Stricture and stenosis of cervix    Stroke (HCC)    TIAs    Past Surgical History:  Procedure Laterality Date   BREAST LUMPECTOMY Left    BREAST SURGERY     CARDIOVERSION N/A 06/24/2015   Procedure: CARDIOVERSION;  Surgeon: Quintella Reichert, MD;  Location: [facility];  Service: Cardiovascular;  Laterality: N/A;    CARDIOVERSION N/A 05/19/2016   Procedure: CARDIOVERSION;  Surgeon: Quintella Reichert, MD;  Location: [facility];  Service: Cardiovascular;  Laterality: N/A;   COLONOSCOPY     EYE SURGERY Bilateral    cataract removal by Dr. Burgess Estelle   FEMUR IM NAIL Left 07/28/2022   Procedure: INTRAMEDULLARY (IM) NAIL FEMORAL;  Surgeon: Marcene Corning, MD;  Location: [facility];  Service: Orthopedics;  Laterality: Left;   LUNG CANCER SURGERY  2003   resection carcinoid lingula-lt upper lobe   RADIOACTIVE SEED GUIDED PARTIAL MASTECTOMY WITH AXILLARY SENTINEL LYMPH NODE BIOPSY Left 06/26/2014   Procedure: RADIOACTIVE SEED GUIDED PARTIAL MASTECTOMY WITH AXILLARY SENTINEL LYMPH NODE BIOPSY;  Surgeon: Almond Lint, MD;  Location: [facility];  Service: General;  Laterality: Left;   RE-EXCISION OF BREAST LUMPECTOMY Left 08/07/2014   Procedure: RE-EXCISION OF LEFT BREAST LUMPECTOMY;  Surgeon: Almond Lint, MD;  Location: [facility];  Service: General;  Laterality: Left;   RIGHT HEART CATH N/A 04/27/2016   Procedure: Right Heart  Cath;  Surgeon: Laurey Morale, MD;  Location[facility] INVASIVE CV LAB;  Service: Cardiovascular;  Laterality: N/A;   TEE WITHOUT CARDIOVERSION N/A 01/07/2021   Procedure: TRANSESOPHAGEAL ECHOCARDIOGRAM (TEE);  Surgeon: Elease Hashimoto Deloris Ping, MD;  Location: [facility] ENDOSCOPY;  Service: Cardiovascular;  Laterality: N/A;     Home Medications:  Prior to Admission medications   Medication Sig Start Date End Date Taking? Authorizing Provider  albuterol (PROVENTIL HFA;VENTOLIN HFA) 108 (90 Base) MCG/ACT inhaler Inhale 2 puffs into the lungs every 6 (six) hours as needed for wheezing or shortness of breath.   Yes [provider]  apixaban (ELIQUIS) 5 MG TABS tablet Take 1 tablet (5 mg total) by mouth 2 (two) times daily. 08/03/22  Yes Turner, Cornelious Bryant, MD  Apoaequorin (PREVAGEN) 10 MG CAPS Take 1 capsule by mouth every morning. 11/28/20  Yes Boscia, Heather E, NP  ARMOUR THYROID 15  MG tablet TAKE 1 TABLET DAILY ALONG WITH 60 MG Patient taking differently: Take 15 mg by mouth daily. Take with 60mg  for a total dose of 75mg  daily. 12/16/21  Yes Abonza, Maritza, PA-C  ARMOUR THYROID 60 MG tablet TAKE 1 TABLET DAILY BEFORE BREAKFAST Patient taking differently: Take 60 mg by mouth daily. Takes with 15mg  daily. 01/13/22  Yes Abonza, Maritza, PA-C  atorvastatin (LIPITOR) 40 MG tablet Take 1 tablet (40 mg total) by mouth daily before supper. 11/25/21 11/26/22 Yes Turner, Traci R, MD  bimatoprost (LUMIGAN) 0.01 % SOLN Place 1 drop into both eyes 2 (two) times daily.   Yes [provider]  calcium-vitamin D (OSCAL WITH D) 500-200 MG-UNIT tablet Take 1 tablet by mouth daily with breakfast. 11/03/17  Yes Opalski, Deborah, DO  dexamethasone (DECADRON) 4 MG tablet Take 2 tablets (8 mg) by mouth daily for 3 days starting the day after chemotherapy. Take with food. 07/21/22  Yes Josph Macho, MD  diltiazem (CARDIZEM CD) 300 MG 24 hr capsule Take 1 capsule (300 mg total) by mouth daily. 03/16/22  Yes Turner, Cornelious Bryant, MD  dorzolamide-timolol (COSOPT) 22.3-6.8 MG/ML ophthalmic solution Place 1 drop into both eyes at bedtime.   Yes [provider]  fluticasone (FLONASE) 50 MCG/ACT nasal spray 1 spray each nostril following sinus rinses twice daily 02/08/18  Yes Opalski, Deborah, DO  fluticasone-salmeterol (ADVAIR HFA) 230-21 MCG/ACT inhaler USE 2 INHALATIONS TWICE A DAY 06/25/22  Yes Saralyn Pilar A, PA  furosemide (LASIX) 40 MG tablet TAKE 1 TABLET TWICE A DAY 05/25/22  Yes Turner, Traci R, MD  gabapentin (NEURONTIN) 400 MG capsule TAKE 1 CAPSULE TWICE A DAY 11/25/21  Yes Abonza, Maritza, PA-C  L-Methylfolate-B12-B6-B2 (CEREFOLIN) 07-31-48-5 MG TABS Take 1 tablet by mouth every morning. 02/10/22  Yes McCue, Shanda Bumps, NP  levETIRAcetam (KEPPRA XR) 500 MG 24 hr tablet Take 1 tablet (500 mg total) by mouth at bedtime. 08/07/21  Yes McCue, Shanda Bumps, NP  loratadine (CLARITIN) 10 MG tablet Take  10 mg by mouth daily.   Yes [provider]  memantine (NAMENDA) 10 MG tablet Take 1 tablet (10 mg total) by mouth 2 (two) times daily. 08/07/21  Yes McCue, Shanda Bumps, NP  metoprolol succinate (TOPROL-XL) 50 MG 24 hr tablet Take 1.5 tablets (75 mg total) by mouth daily. Take with or immediately following a meal. 09/05/21  Yes Turner, Cornelious Bryant, MD  Multiple Vitamins-Iron (MULTIVITAMIN/IRON) TABS Take 1 tablet by mouth daily.   Yes [provider]  Netarsudil Dimesylate (RHOPRESSA) 0.02 % SOLN Place 1 drop into both eyes at bedtime.  Yes [provider]  ondansetron (ZOFRAN) 8 MG tablet Take 1 tablet (8 mg total) by mouth every 8 (eight) hours as needed for nausea or vomiting. Start on the third day after chemotherapy. 07/21/22  Yes Josph Macho, MD  OVER THE COUNTER MEDICATION Take 1 tablet by mouth daily. Zyflamend otc herbal supplement   Yes [provider]  pantoprazole (PROTONIX) 40 MG tablet TAKE 1 TABLET DAILY 02/26/22  Yes Boscia, Heather E, NP  potassium chloride SA (KLOR-CON M) 20 MEQ tablet TAKE 1 TABLET TWICE A DAY 07/30/22  Yes Turner, Cornelious Bryant, MD  traMADol (ULTRAM) 50 MG tablet Take 1 tablet (50 mg total) by mouth every 6 (six) hours as needed (post op pain). 07/29/22  Yes Elodia Florence, PA-C  Vitamin D, Ergocalciferol, (DRISDOL) 1.25 MG (50000 UNIT) CAPS capsule TAKE 1 CAPSULE WEEKLY 10/28/21  Yes Boscia, Kathlynn Grate, NP  prochlorperazine (COMPAZINE) 10 MG tablet Take 1 tablet (10 mg total) by mouth every 6 (six) hours as needed for nausea or vomiting. 07/21/22   Josph Macho, MD    Inpatient Medications: Scheduled Meds:  apixaban  5 mg Oral BID   atorvastatin  40 mg Oral QAC supper   dorzolamide  1 drop Both Eyes QHS   And   timolol  1 drop Both Eyes QHS   furosemide  40 mg Intravenous Q12H   gabapentin  400 mg Oral BID   latanoprost  1 drop Both Eyes QHS   levETIRAcetam  500 mg Oral QHS   metoprolol succinate  75 mg Oral Daily    mometasone-formoterol  2 puff Inhalation BID   Netarsudil Dimesylate  1 drop Both Eyes QHS   potassium chloride  40 mEq Oral Q4H   thyroid  75 mg Oral QAC breakfast   Continuous Infusions:  diltiazem (CARDIZEM) infusion Stopped (08/14/22 0921)   PRN Meds: acetaminophen, albuterol, ondansetron (ZOFRAN) IV, mouth rinse, traMADol  Allergies:    Allergies  Allergen Reactions   Combigan [Brimonidine Tartrate-Timolol] Itching    Per patient made eyes red, sore, and sensitivity to light   Sulfa Antibiotics Hives and Rash   Sulfamethoxazole-Trimethoprim Hives    Social History:   Social History   Socioeconomic History   Marital status: Married    Spouse name: Charles   Number of children: 2   Years of education: Not on file   Highest education level: High school graduate  Occupational History   Occupation: Retired  Tobacco Use   Smoking status: Former    Packs/day: 0.75    Years: 5.00    Additional pack years: 0.00    Total pack years: 3.75    Types: Cigarettes    Quit date: 06/15/1966    Years since quitting: 56.2    Passive exposure: Past   Smokeless tobacco: Never  Vaping Use   Vaping Use: Never used  Substance and Sexual Activity   Alcohol use: Not Currently    Comment: occasional wine   Drug use: No   Sexual activity: Not Currently    Birth control/protection: Post-menopausal  Other Topics Concern   Not on file  Social History Narrative   Retired. Lives with husband.    Right Handed   Drinks 1-2 cups caffeine daily   Social Determinants of Health   Financial Resource Strain: Low Risk  (10/09/2021)   Overall Financial Resource Strain (CARDIA)    Difficulty of Paying Living Expenses: Not hard at all  Food Insecurity: No Food Insecurity (08/13/2022)  Hunger Vital Sign    Worried About Running Out of Food in the Last Year: Never true    Ran Out of Food in the Last Year: Never true  Transportation Needs: No Transportation Needs (08/13/2022)   PRAPARE -  Administrator, Civil Service (Medical): No    Lack of Transportation (Non-Medical): No  Physical Activity: Insufficiently Active (10/09/2021)   Exercise Vital Sign    Days of Exercise per Week: 3 days    Minutes of Exercise per Session: 30 min  Stress: No Stress Concern Present (10/09/2021)   [employer] of Occupational Health - Occupational Stress Questionnaire    Feeling of Stress : Not at all  Social Connections: Socially Integrated (10/09/2021)   Social Connection and Isolation Panel [NHANES]    Frequency of Communication with Friends and Family: More than three times a week    Frequency of Social Gatherings with Friends and Family: Three times a week    Attends Religious Services: More than 4 times per year    Active Member of Clubs or Organizations: Yes    Attends Banker Meetings: More than 4 times per year    Marital Status: Married  Catering manager Violence: Not At Risk (08/13/2022)   Humiliation, Afraid, Rape, and Kick questionnaire    Fear of Current or Ex-Partner: No    Emotionally Abused: No    Physically Abused: No    Sexually Abused: No    Family History:    Family History  Problem Relation Age of Onset   Stroke Mother    Hypertension Mother    Hyperlipidemia Mother    Stroke Father    Transient ischemic attack Father    Hyperlipidemia Father    Hypertension Father    Atrial fibrillation Son    Heart attack Neg Hx      ROS:  Please see the history of present illness.   All other ROS reviewed and negative.     Physical Exam/Data:   Vitals:   08/14/22 0240 08/14/22 0627 08/14/22 0645 08/14/22 0919  BP:  (!) 91/58 105/62 (!) 88/58  Pulse: 93 74 98 (!) 114  Resp: 20 20 (!) 23   Temp:  98.1 F (36.7 C)    TempSrc:  Oral    SpO2: 93% 94% 93%   Weight:      Height:        Intake/Output Summary (Last 24 hours) at 08/14/2022 0938 Last data filed at 08/14/2022 0300 Gross per 24 hour  Intake 263.92 ml  Output 1550 ml  Net  -1286.08 ml      08/13/2022   12:11 PM 08/10/2022   11:01 AM 07/28/2022   12:18 PM  Last 3 Weights  Weight (lbs) 152 lb 12.5 oz 152 lb 12.8 oz 145 lb  Weight (kg) 69.3 kg 69.31 kg 65.772 kg     Body mass index is 29.84 kg/m.  General:  Well nourished, well developed, in no acute distress HEENT: normal Neck: no JVD Vascular: No carotid bruits; Distal pulses 2+ bilaterally Cardiac: Irregular; no murmur  Lungs:  clear to auscultation bilaterally, no wheezing, rhonchi or rales  Abd: soft, nontender, no hepatomegaly  Ext: no edema Musculoskeletal:  No deformities, BUE and BLE strength normal and equal Skin: warm and dry  Neuro:  CNs 2-12 intact, no focal abnormalities noted Psych:  Normal affect   EKG:  The EKG was personally reviewed and demonstrates: Atrial fibrillation with RVR, heart rate 130s. Telemetry:  Telemetry  was personally reviewed and demonstrates:   Atrial fibrillation, heart rate in the 90s to low 100s overnight.  Relevant CV Studies:  Echo 07/21/2022 1. Left ventricular ejection fraction, by estimation, is 60 to 65%. The  left ventricle has normal function. The left ventricle has no regional  wall motion abnormalities. There is mild left ventricular hypertrophy.  Left ventricular diastolic parameters  were normal.   2. Right ventricular systolic function is normal. The right ventricular  size is mildly enlarged.   3. Left atrial size was severely dilated.   4. Right atrial size was severely dilated.   5. The mitral valve is normal in structure. Mild to moderate mitral valve  regurgitation. No evidence of mitral stenosis.   6. Tricuspid valve regurgitation is moderate to severe.   7. The aortic valve is tricuspid. Aortic valve regurgitation is mild to  moderate. No aortic stenosis is present. Aortic regurgitation PHT measures  389 msec.   8. The inferior vena cava is normal in size with greater than 50%  respiratory variability, suggesting right atrial pressure  of 3 mmHg.    Laboratory Data:  High Sensitivity Troponin:   Recent Labs  Lab 08/13/22 2037 08/13/22 2235  TROPONINIHS 7 8     Chemistry Recent Labs  Lab 08/13/22 1053 08/13/22 1215 08/13/22 1244 08/14/22 0406  NA 138 134*  --  139  K 4.0 3.6  --  3.2*  CL 105 104  --  108  CO2 25 23  --  23  GLUCOSE 102* 100*  --  101*  BUN 13 12  --  11  CREATININE 1.05* 0.99  --  0.82  CALCIUM 8.7* 8.0*  --  7.5*  MG  --   --  2.3 2.4  GFRNONAA 53* 57*  --  >60  ANIONGAP 8 7  --  8    Recent Labs  Lab 08/10/22 1052 08/13/22 1053  PROT 5.6* 5.7*  ALBUMIN 3.3* 3.4*  AST 26 21  ALT 34 27  ALKPHOS 80 98  BILITOT 0.7 0.7   Lipids No results for input(s): "CHOL", "TRIG", "HDL", "LABVLDL", "LDLCALC", "CHOLHDL" in the last 168 hours.  Hematology Recent Labs  Lab 08/13/22 1053 08/13/22 1054 08/13/22 1215 08/14/22 0406  WBC 6.5  --  6.6 3.9*  RBC 2.93* 2.95* 3.04* 2.79*  HGB 9.8*  --  10.3* 9.4*  HCT 30.4*  --  31.6* 29.6*  MCV 103.8*  --  103.9* 106.1*  MCH 33.4  --  33.9 33.7  MCHC 32.2  --  32.6 31.8  RDW 20.4*  --  20.9* 20.5*  PLT 315  --  324 282   Thyroid  Recent Labs  Lab 08/13/22 1244  TSH 5.612*  FREET4 0.55*    BNP Recent Labs  Lab 08/13/22 2037  BNP 548.3*    DDimer No results for input(s): "DDIMER" in the last 168 hours.   Radiology/Studies:  CT Chest W Contrast  Result Date: 08/13/2022 CLINICAL DATA:  Pneumonia, complication suspected, xray done EXAM: CT CHEST WITH CONTRAST TECHNIQUE: Multidetector CT imaging of the chest was performed during intravenous contrast administration. RADIATION DOSE REDUCTION: This exam was performed according to the departmental dose-optimization program which includes automated exposure control, adjustment of the mA and/or kV according to patient size and/or use of iterative reconstruction technique. CONTRAST:  80mL OMNIPAQUE IOHEXOL 300 MG/ML  SOLN COMPARISON:  Radiograph earlier today.  PET CT 07/02/2022 FINDINGS:  Cardiovascular: Multi chamber cardiomegaly, with primarily biatrial enlargement. Moderate atherosclerosis  of the thoracic aorta, no dissection. There is no pulmonary embolus, excellent pulmonary arterial opacification on this non dedicated arterial exam. No pericardial effusion. Mediastinum/Nodes: Small hiatal hernia. Scattered small mediastinal nodes are all subcentimeter, not enlarged by size criteria. No axillary adenopathy, prior left axillary node dissection. Lungs/Pleura: There are small right and moderate left pleural effusion. The left pleural effusion has increased from prior PET. The right pleural effusion is new. No convincing pleural nodularity or enhancement. Adjacent compressive as well as bandlike atelectasis. Treatment related changes in the left anterior hemithorax. No airspace disease to suggest pneumonia. No suspicious pulmonary nodule or mass. Trachea and central airways are clear. Upper Abdomen: Mild body wall edema in the upper abdomen. Fat containing left adrenal nodule measuring 2 cm, unchanged and not hypermetabolic on PET. Musculoskeletal: The metastatic disease involving the left clavicular head on prior PET is only faintly visualized with slight cortical thinning on CT. There is slight heterogeneity of the left anterior T9 rib. Multilevel degenerative change in the spine. Postsurgical change in the left breast. IMPRESSION: 1. Small right and moderate left pleural effusions. The left pleural effusion has increased from prior PET. The right pleural effusion is new. 2. Adjacent compressive as well as bandlike atelectasis. No airspace disease to suggest pneumonia. 3. Multi chamber cardiomegaly, with primarily biatrial enlargement. 4. The metastatic disease involving the left clavicular head on prior PET is only faintly visualized with slight cortical thinning on CT. There is slight heterogeneity of the left anterior T9 rib. Aortic Atherosclerosis (ICD10-I70.0). Electronically Signed   By:  Narda [name] M.D.   On: 08/13/2022 17:41   DG Chest Port 1 View  Result Date: 08/13/2022 CLINICAL DATA:  Atrial fibrillation EXAM: PORTABLE CHEST 1 VIEW COMPARISON:  Previous chest radiographs done on 02/22/2020, CT chest done on 02/05/2021 FINDINGS: Transverse diameter of heart is increased. There is poor inspiration. There are no signs of alveolar pulmonary edema. There is increased density in left lower lung field. Surgical clips are seen in left chest wall. IMPRESSION: Cardiomegaly. There are no signs of alveolar pulmonary edema. Increased density in left lower lung field may suggest small pleural effusion and underlying atelectasis/pneumonia. Electronically Signed   By: Ernie Avena M.D.   On: 08/13/2022 13:25   CT VENOGRAM ABD/PELVIS/LOWER EXT BILAT  Result Date: 08/10/2022 CLINICAL DATA:  Left leg edema and anemia following intramedullary nail fixation of the left femur following a pathological fracture with lytic lesions. The patient had a lung carcinoid tumor removed 2 years ago. Previous left lumpectomy for breast cancer in 2015 with subsequent left L5 vertebral body biopsied demonstrating metastatic breast cancer. Interval additional bone metastases. EXAM: CT VENOGRAM ABDOMEN AND PELVIS AND LOWER EXTREMITY BILATERAL TECHNIQUE: Venographic phase images of the abdomen, pelvis and lower extremities were obtained following the administration of intravenous contrast. Multiplanar reformats and maximum intensity projections were generated. RADIATION DOSE REDUCTION: This exam was performed according to the departmental dose-optimization program which includes automated exposure control, adjustment of the mA and/or kV according to patient size and/or use of iterative reconstruction technique. CONTRAST:  OMNIPAQUE IOHEXOL 350 MG/ML SOLN COMPARISON:  PET-CT dated 07/02/2022. Left L5 vertebral body CT biopsy dated 06/30/2021. Left femur radiographs dated 07/08/2022. FINDINGS: Lower chest:  Small right pleural effusion and moderate-sized left pleural effusion. Minimal bibasilar atelectasis/scarring. Hepatobiliary: No focal liver abnormality is seen. No gallstones, gallbladder wall thickening, or biliary dilatation. Pancreas: Unremarkable. No pancreatic ductal dilatation or surrounding inflammatory changes. Spleen: Normal in size without focal abnormality. 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 Unremarkable right adrenal gland, kidneys, ureters and urinary bladder. Stomach/Bowel: Multiple sigmoid and descending colon diverticula without evidence of diverticulitis. Unremarkable appendix, small bowel and stomach. Vascular/Lymphatic: No significant vascular findings are present. No enlarged abdominal or pelvic lymph nodes. Reproductive: Uterus and bilateral adnexa are unremarkable. Other: Tiny umbilical hernia containing fat. Musculoskeletal: Hardware fixation of the proximal left femur. This is bridging an intertrochanteric fracture. Small lytic cortical lesion in the anterior mid femoral shaft on the left, unchanged. Mixed sclerotic and lytic lesion throughout the L5 vertebral body corresponding to the known metastasis. Lumbar and lower thoracic spine degenerative changes. Mild left hip degenerative changes. Mild scoliosis. IVC: Suboptimally opacified.  No visible filling defects. Portal and mesenteric veins: Well opacified. No visible filling defects. Bilateral iliac veins: Suboptimally opacified. No visible filling defects. Right lower extremity: Suboptimally opacified veins. No visible filling defects. Mild subcutaneous edema. No hematoma. Left lower extremity: Suboptimally opacified veins. No visible filling defects. Mild subcutaneous edema. Small amount of residual postoperative soft tissue air. No hematoma. IMPRESSION: 1. Suboptimal opacification of the IVC, bilateral iliac veins and bilateral lower extremity veins. No visible deep venous thrombus.  2. No hematoma. 3. Small right pleural effusion and moderate-sized left pleural effusion. 4. 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 5. Sigmoid and descending colon diverticulosis. 6. Hardware fixation of the proximal left femur bridging an intertrochanteric fracture. 7. Small lytic cortical lesion in the anterior mid femoral shaft on the left, unchanged, compatible with a known metastasis. 8. Mixed sclerotic and lytic lesion throughout the L5 vertebral body corresponding to a known metastasis. Electronically Signed   By: Beckie Salts M.D.   On: 08/10/2022 14:05     Assessment and Plan:   Permanent atrial fibrillation with RVR: Heart rate has been borderline controlled in the 90s and low 100 overnight.  Heart rate was elevated in the 140s yesterday, however this was in the setting of borderline hypotension and frailty.  She has some degree of deconditioning which also likely contributed to the elevated heart rate.  IV Cardizem and Toprol-XL currently on hold due to hypotension.  Will restart Toprol-XL once systolic blood pressures greater than 105 mmHg.  No sign of PE or dissection on CT image.  Hypotension: Could be related to IV diuresis.  Hold IV diltiazem, metoprolol succinate and IV Lasix.  She still has crackles in the base, however I suspect the crackle is more related to atelectasis rather than pulmonary edema.  She has moderate-sized left pleural effusion and a small right pleural effusion.  May need paracentesis if breathing does not improve.   Persistent anemia: She has been anemic since her hip surgery last month.  Hemoglobin around 9.5 with elevated retic count.  Prior to hip surgery, hemoglobin was 13  Mild acute on chronic diastolic heart failure: No sign of PE on CT image.  On 40 mg twice a day of oral Lasix at home.  Has been placed on 40 mg twice daily of IV Lasix in the hospital. I/O -1.2 L overnight.  She still has mild bibasilar crackles which I suspect is more atelectasis rather than  pulmonary edema.  She has a moderate-sized left pleural effusion.  I do not think she is significantly volume overloaded based on physical exam.  Her oncology history could not certainly contribute to the pleural effusion.  Will discuss with MD.  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  Pulmonary hypertension  Mitral regurgitation: History of moderate to severe MR.  Previously seen by Dr. Excell Seltzer who felt that she is not a good candidate for transcatheter therapy due to severe posterior leaflet calcification.  Most recent echocardiogram obtained in May 2024 suggested mild to moderate MR  Hyperlipidemia Acute history of CVA   Risk Assessment/Risk Scores:       New York Heart Association (NYHA) Functional Class NYHA Class III  CHA2DS2-VASc Score = 7   This indicates a 11.2% annual risk of stroke. The patient's score is based upon: CHF History: 1 HTN History: 1 Diabetes History: 0 Stroke History: 2 Vascular Disease History: 0 Age Score: 2 Gender Score: 1    For questions or updates, please contact [facility] Please consult [email or web address] for contact info under   Signed, Azalee Course, [place]  08/14/2022 9:38 AM  History and all data above reviewed.  Patient examined.  I agree with the findings as above.  The patient has complicated history as above.  When she went to her appointment recently she had a controlled fall with just diffuse weakness.  She has been using a walker to get any distance since her leg surgery but usually walks maybe with a cane or unassisted at home.  She does not describe presyncope or syncope.  She does not really feel her atrial fibrillation usually.  She was evaluated during her office visit to have rapid atrial fibs rates.  She has not had any chest pressure, neck or arm discomfort.  She does get short of breath walking a mild to moderate distance  on level ground but she does not report any resting shortness of breath, PND or orthopnea.  She has had no chest pressure.  The patient exam reveals COR: Irregular, no murmurs,  Lungs: Decreased breath sounds at the right base greater than left,  Abd: Positive bowel sounds no rebound, guarding Ext 2+ pulses, bruising on the left leg, mild left leg edema.  All available labs, radiology testing, previous records reviewed. Agree with documented assessment and plan.  Atrial fibrillation: She has rapid rate.  This is been chronic atrial fibrillation.  This can be difficult to control because of her low blood pressure.  I think this is multifactorial and that she has had just somewhat declining and difficult time with her metastatic cancer.  She does have pleural effusions as noted on her CT but she is not overly symptomatic with this.  She is oxygenating well on 1 L.  We are going to hold her diuretic today.  I am going to discontinue any IV metoprolol which she got 3 times yesterday.  I am going to try to give her metoprolol 4 times daily today to see if we can slow this down.  We will also give her a dose of IV dig and then switch to p.o.  Overall this may be somewhat difficult to control and her frail state with along other things some malnutrition and probably third spacing of fluids.  I had a long discussion with her husband and the patient about this.  Fayrene Fearing Quadre [place]  11:02 AM  08/14/2022

## 2022-08-14 NOTE — Evaluation (Signed)
Physical Therapy Evaluation Patient Details Name: Patricia [name] MRN: [id number] DOB: 1941-01-04 Today's Date: 08/14/2022  History of Present Illness  82 y.o. female admitted on 08/13/22 after she was walking to her oncologist office, and had sudden onset dizziness and slowly slumped down to the ground but did not lose consciousness or hit her head.  PMH: afib, lung cancer, metastatic breast cancer, CKD, 07/28/22 for prophylactic IM nail L femur 2* metastatic lesion.  Clinical Impression  Pt admitted with above diagnosis. Pt from home, using RW for in home ambulation since surgery 5/28, able to ascend/descend steps to enter home with handrail and no difficulty, ind with self care and spouse completing household chores. Pt completes bed mobility and transfers with RW and min G for safety. Pt HR varying, up to 158 with stand pivot transfer; RN in room at EOS and aware of varying HR. Pt on 1L with SpO2 >92%. Will progress mobility as able respecting vitals in therapeutic range. Pt currently with functional limitations due to the deficits listed below (see PT Problem List). Pt will benefit from acute skilled PT to increase their independence and safety with mobility to allow discharge.          Recommendations for follow up therapy are one component of a multi-disciplinary discharge planning process, led by the attending physician.  Recommendations may be updated based on patient status, additional functional criteria and insurance authorization.  Follow Up Recommendations       Assistance Recommended at Discharge Frequent or constant Supervision/Assistance  Patient can return home with the following  A little help with walking and/or transfers;A little help with bathing/dressing/bathroom;Assistance with cooking/housework;Assist for transportation;Help with stairs or ramp for entrance    Equipment Recommendations None recommended by PT  Recommendations for Other Services       Functional Status  Assessment Patient has had a recent decline in their functional status and demonstrates the ability to make significant improvements in function in a reasonable and predictable amount of time.     Precautions / Restrictions Precautions Precautions: Fall Precaution Comments: monitor vitals Restrictions Weight Bearing Restrictions: No LLE Weight Bearing: Weight bearing as tolerated      Mobility  Bed Mobility Overal bed mobility: Needs Assistance Bed Mobility: Supine to Sit     Supine to sit: Min guard, HOB elevated     General bed mobility comments: increased time and effort to come to sitting EOB, min G    Transfers Overall transfer level: Needs assistance Equipment used: Rolling walker (2 wheels) Transfers: Sit to/from Stand, Bed to chair/wheelchair/BSC Sit to Stand: Min guard   Step pivot transfers: Min guard       General transfer comment: min G for safety to power up to stand, pt able to take a few steps over to recliner without overt LOB    Ambulation/Gait                  Stairs            Wheelchair Mobility    Modified Rankin (Stroke Patients Only)       Balance Overall balance assessment: Needs assistance, History of Falls Sitting-balance support: Feet supported, No upper extremity supported Sitting balance-Leahy Scale: Good     Standing balance support: During functional activity Standing balance-Leahy Scale: Fair Standing balance comment: static without UE support, dynamic wtih RW  Pertinent Vitals/Pain Pain Assessment Pain Assessment: No/denies pain    Home Living Family/patient expects to be discharged to:: Private residence Living Arrangements: Spouse/significant other Available Help at Discharge: Family;Available 24 hours/day Type of Home: House Home Access: Stairs to enter Entrance Stairs-Rails: Right Entrance Stairs-Number of Steps: 3   Home Layout: One level Home Equipment:  Cane - single Librarian, academic (2 wheels);Shower seat - built in;Grab bars - tub/shower;Hand held shower head      Prior Function Prior Level of Function : Needs assist;History of Falls (last six months)       Physical Assist : ADLs (physical)   ADLs (physical): IADLs;Bathing Mobility Comments: Has been using RW since surgery 07/28/22, using handrail for front steps without difficulty ADLs Comments: Ind prior to her hip surgery on 07/28/22. Since that time, husband has been performing all cooking, cleaning, laundry, driving and sets up pt's medication.  Both pt and husband enjoy golfing.     Hand Dominance   Dominant Hand: Right    Extremity/Trunk Assessment   Upper Extremity Assessment Upper Extremity Assessment: Defer to OT evaluation    Lower Extremity Assessment Lower Extremity Assessment: Overall WFL for tasks assessed    Cervical / Trunk Assessment Cervical / Trunk Assessment: Normal  Communication   Communication: No difficulties  Cognition Arousal/Alertness: Awake/alert Behavior During Therapy: WFL for tasks assessed/performed Overall Cognitive Status: Within Functional Limits for tasks assessed                                          General Comments General comments (skin integrity, edema, etc.): HR 107-140 noted in supine and sitting EOB, HR 120-158 noted in static standing and with step pivot to recliner, HR back to 110s once sitting in recliner- RN in room and aware    Exercises     Assessment/Plan    PT Assessment Patient needs continued PT services  PT Problem List Decreased activity tolerance;Decreased balance;Decreased mobility;Decreased knowledge of precautions;Cardiopulmonary status limiting activity       PT Treatment Interventions DME instruction;Gait training;Stair training;Functional mobility training;Therapeutic activities;Therapeutic exercise;Balance training;Neuromuscular re-education;Patient/family education    PT  Goals (Current goals can be found in the Care Plan section)  Acute Rehab PT Goals Patient Stated Goal: agreeable to therapy PT Goal Formulation: With patient/family Time For Goal Achievement: 08/28/22 Potential to Achieve Goals: Good    Frequency Min 1X/week     Co-evaluation               AM-PAC PT "6 Clicks" Mobility  Outcome Measure Help needed turning from your back to your side while in a flat bed without using bedrails?: A Little Help needed moving from lying on your back to sitting on the side of a flat bed without using bedrails?: A Little Help needed moving to and from a bed to a chair (including a wheelchair)?: A Little Help needed standing up from a chair using your arms (e.g., wheelchair or bedside chair)?: A Little Help needed to walk in hospital room?: A Little Help needed climbing 3-5 steps with a railing? : A Little 6 Click Score: 18    End of Session Equipment Utilized During Treatment: Gait belt;Oxygen Activity Tolerance: Patient tolerated treatment well;Treatment limited secondary to medical complications (Comment) (elevated HR) Patient left: in chair;with call bell/phone within reach;with family/visitor present;with nursing/sitter in room Nurse Communication: Mobility status PT Visit Diagnosis: Other abnormalities of gait  and mobility (R26.89)    Time: 4098-1191 PT Time Calculation (min) (ACUTE ONLY): 18 min   Charges:   PT Evaluation $PT Eval Moderate Complexity: 1 Mod           Tori Chancelor Hardrick PT, DPT 08/14/22, 12:45 PM

## 2022-08-14 NOTE — TOC Initial Note (Signed)
Transition of Care [facility]) - Initial/Assessment Note    Patient Details  Name: Patricia [name] MRN: [id number] Date of Birth: 06/25/1940  [facility] CM/SW Contact:    Larrie Kass, LCSW Phone Number: 08/14/2022, 5:22 PM  Clinical Narrative:                 CSW spoke with pt and her husband to discuss Home Health rec. Pt has agreed and has no preference. Pt reports no DME needs and pt's husband will transports pt home at time of  d/c.  [facility] was able to accept pt for HHPT/OT, will need orders. TOC to follow for D/C needs.  Expected Discharge Plan: Home w Home Health Services Barriers to Discharge: Continued Medical Work up   Patient Goals and CMS Choice Patient states their goals for this hospitalization and ongoing recovery are:: retrun home with home health CMS Medicare.gov Compare Post Acute Care list provided to:: Patient Choice offered to / list presented to : Patient      Expected Discharge Plan and Services In-house Referral: Clinical Social Work     Living arrangements for the past 2 months: Single Family Home                                      Prior Living Arrangements/Services Living arrangements for the past 2 months: Single Family Home Lives with:: Self Patient language and need for interpreter reviewed:: Yes Do you feel safe going back to the place where you live?: Yes      Need for Family Participation in Patient Care: No (Comment) Care giver support system in place?: Yes (comment)   Criminal Activity/Legal Involvement Pertinent to Current Situation/Hospitalization: No - Comment as needed  Activities of Daily Living Home Assistive Devices/Equipment: Hand-held shower hose, Walker (specify type) ADL Screening (condition at time of admission) Patient's cognitive ability adequate to safely complete daily activities?: No Is the patient deaf or have difficulty hearing?: Yes Does the patient have difficulty seeing, even  when wearing glasses/contacts?: No Does the patient have difficulty concentrating, remembering, or making decisions?: Yes Patient able to express need for assistance with ADLs?: Yes Does the patient have difficulty dressing or bathing?: Yes Independently performs ADLs?: No Communication: Needs assistance Is this a change from baseline?: Pre-admission baseline Dressing (OT): Needs assistance Is this a change from baseline?: Pre-admission baseline Grooming: Needs assistance Is this a change from baseline?: Pre-admission baseline Feeding: Independent Bathing: Needs assistance Is this a change from baseline?: Pre-admission baseline Toileting: Needs assistance Is this a change from baseline?: Pre-admission baseline In/Out Bed: Needs assistance Is this a change from baseline?: Pre-admission baseline Walks in Home: Needs assistance Is this a change from baseline?: Pre-admission baseline Does the patient have difficulty walking or climbing stairs?: Yes Weakness of Legs: Both Weakness of Arms/Hands: None  Permission Sought/Granted                  Emotional Assessment Appearance:: Appears stated age Attitude/Demeanor/Rapport: Gracious Affect (typically observed): Accepting Orientation: : Oriented to Place, Oriented to  Time, Oriented to Situation, Oriented to Self   Psych Involvement: No (comment)  Admission diagnosis:  Hypoxia [R09.02] Pleural effusion on left [J90] Atrial fibrillation with RVR (HCC) [I48.91] Pneumonia of left lower lobe due to infectious organism [J18.9] A-fib [facility]) [I48.91] Patient Active Problem List   Diagnosis Date Noted   A-fib (HCC) 08/14/2022   Atrial  fibrillation with RVR (HCC) 08/13/2022   Pleural effusion, bilateral 08/13/2022   Acute on chronic heart failure with preserved ejection fraction (HFpEF) (HCC) 08/13/2022   Pain of left femur 07/28/2022   Genetic testing 08/28/2021   Metastasis to bone (HCC) 07/14/2021   Mitral regurgitation     Enlargement of right atrium 01/23/2020   Pulmonary HTN (HCC)    Seizure disorder (HCC)- txed by Pearlean Brownie- Neuro 03/06/2019   Leukocytosis 12/05/2018   Bacteria in urine 12/05/2018   Altered mental status 12/05/2018   Cerebrovascular accident (HCC) 09/27/2017   Memory changes 09/27/2017   Hypocalcemia 05/27/2017   h/o Radiation 04/14/2017   High risk medication use 04/14/2017   Arthritis 01/27/2017   Prediabetes 01/06/2017   Vitamin D deficiency 01/06/2017   Postmenopausal bone loss 01/06/2017   CKD (chronic kidney disease), stage III (HCC) 10/30/2016   Lung cancer, lingula (HCC) 09/08/2016   History of CVA (cerebrovascular accident) 05/14/2016   Abnormal PFT 10/24/2015   Persistent atrial fibrillation (HCC)    Chronic diastolic heart failure (HCC) 06/17/2015   Aortic regurgitation 06/17/2015   Essential hypertension 06/17/2015   CVA (cerebral vascular accident) (HCC) 04/30/2015   CVA (cerebral infarction)    Pulmonary edema 04/27/2015   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 08/15/2009   Hyperlipidemia 06/10/2009   Diverticulosis of large intestine 06/10/2009   GLAUCOMA, BILATERAL 02/07/2009   Cough variant asthma 06/27/2007   Osteopenia 05/28/2007   STRICTURE AND STENOSIS OF CERVIX 05/06/2007   POSTMENOPAUSAL STATUS 05/06/2007   Chronic cough 01/21/2007   LUNG CANCER, HX OF 07/29/2006   PCP:  [name], PA Pharmacy:   [facility] DELIVERY - [place], [place] - [address] [place] [address] Phone: [phone] Fax:  [phone]     Social Determinants of Health (SDOH) Social History: SDOH Screenings   Food Insecurity: No Food Insecurity (08/13/2022)  Housing: Low Risk  (08/13/2022)  Transportation Needs: No Transportation Needs (08/13/2022)  Utilities: Not At Risk (08/13/2022)  Alcohol Screen: Low Risk  (10/09/2021)  Depression (PHQ2-9): Low Risk  (04/23/2022)  Financial Resource Strain: Low Risk  (10/09/2021)  Physical Activity: Insufficiently Active (10/09/2021)  Social Connections: Socially Integrated (10/09/2021)  Stress: No Stress Concern Present (10/09/2021)  Tobacco Use: Medium Risk (08/14/2022)   SDOH Interventions:     Readmission Risk Interventions     No data to display

## 2022-08-14 NOTE — Progress Notes (Signed)
Advance directives and living will brought in by spouse and placed in pt chart. Dr Hanley Ben notified of this and pt's request to discuss changing code status to DNR.

## 2022-08-14 NOTE — Progress Notes (Signed)
Patricia [name] seems to be doing better this morning.  I saw her when she came in yesterday and she certainly looked a little bit frail.  However, she is on I diltiazem drip now.  She has a lower heart rate.  I know that Cardiology has seen her.  As always, they always provide incredible input.  She has had no complaints of shortness of breath.  She has had no problems with cough or chest wall pain.  There is been no bleeding.  Her labs is more show white count 3.9.  Hemoglobin 9.4.  Platelet count 282,000.  Her potassium is 3.2.  BUN 11 creatinine 0.82.  Calcium 7.5.  She had a CT scan yesterday.  There is no obvious pneumonia.  She had the pleural effusions.  She had a larger left pleural effusion and a new small right pleural effusion.  Had to believe this is secondary to her underlying cardiac status and not malignancy.  Her last tumor marker for breast cancer was down to 34.  As such, I think she is responding to the Enhertu.  Typically, the Enhertu does not cause any type of arrhythmias.  It can certainly lead to cardiomyopathy but I would think this would happen over a period of time.  Her last echocardiogram which was done back in May showed a good ejection fraction of 60-65%.  Her vital signs are temperature 98.4.  Pulse 86.  Blood pressure 91/60.  Oxygen saturation is 100%.  Her lungs sound clear bilaterally.  Cardiac exam is slightly tachycardic and irregular consistent with atrial fibrillation.  She has 1/6 systolic ejection murmur.  Abdomen is soft.  Bowel sounds are present.  She has no fluid wave.  Extremity shows the healing left hip surgical scar.  Neurological exam is nonfocal.  Hopefully, Patricia [name] atrial fibrillation can get under control.  It does not look like there is any obvious pneumonia.  I would think that the pleural effusions would improve with improvement of her heart rate.  Hopefully, she will not need a thoracentesis.  I know that she is getting incredible care from  everybody on [address]   Christin Bach, MD  Psalm 28:7

## 2022-08-15 DIAGNOSIS — Z7189 Other specified counseling: Secondary | ICD-10-CM

## 2022-08-15 DIAGNOSIS — I4891 Unspecified atrial fibrillation: Secondary | ICD-10-CM | POA: Diagnosis not present

## 2022-08-15 DIAGNOSIS — C50412 Malignant neoplasm of upper-outer quadrant of left female breast: Secondary | ICD-10-CM | POA: Diagnosis not present

## 2022-08-15 DIAGNOSIS — J90 Pleural effusion, not elsewhere classified: Secondary | ICD-10-CM | POA: Diagnosis not present

## 2022-08-15 DIAGNOSIS — I5033 Acute on chronic diastolic (congestive) heart failure: Secondary | ICD-10-CM | POA: Diagnosis not present

## 2022-08-15 MED ORDER — FUROSEMIDE 20 MG PO TABS
10.0000 mg | ORAL_TABLET | Freq: Once | ORAL | Status: AC
  Administered 2022-08-15: 10 mg via ORAL
  Filled 2022-08-15: qty 1

## 2022-08-15 NOTE — Progress Notes (Signed)
MEWS Progress Note  Patient Details Name: Patricia [name] MRN: [id number] DOB: 13-May-1940 Today's Date: 08/15/2022   MEWS Flowsheet Documentation:  Assess: MEWS Score Temp: 98.4 F (36.9 C) BP: 111/66 MAP (mmHg): 79 Pulse Rate: (!) 118 ECG Heart Rate: (!) 120 Resp: (!) 24 Level of Consciousness: Alert SpO2: 94 % O2 Device: Room Air Patient Activity (if Appropriate): In bed O2 Flow Rate (L/min): 0.5 L/min Assess: MEWS Score MEWS Temp: 0 MEWS Systolic: 0 MEWS Pulse: 2 MEWS RR: 1 MEWS LOC: 0 MEWS Score: 3 MEWS Score Color: Yellow Assess: SIRS CRITERIA SIRS Temperature : 0 SIRS Respirations : 1 SIRS Pulse: 1 SIRS WBC: 0 SIRS Score Sum : 2 SIRS Temperature : 0 SIRS Pulse: 1 SIRS Respirations : 1 SIRS WBC: 0 SIRS Score Sum : 2 Assess: if the MEWS score is Yellow or Red Were vital signs taken at a resting state?: Yes Focused Assessment: No change from prior assessment Does the patient meet 2 or more of the SIRS criteria?: Yes Does the patient have a confirmed or suspected source of infection?: No MEWS guidelines implemented : No, previously red, continue vital signs every 4 hours Treat MEWS Interventions: Considered administering scheduled or prn medications/treatments as ordered Take Vital Signs Increase Vital Sign Frequency : Yellow: Q2hr x1, continue Q4hrs until patient remains green for 12hrs Escalate MEWS: Escalate: Yellow: Discuss with charge nurse and consider notifying provider and/or RRT Notify: Charge Nurse/RN Name of Charge Nurse/RN Notified: Barkley Bruns S.,RN Provider Notification Provider Name/Title: A.Virgel Manifold, NP Date Provider Notified: 08/14/22 Time Provider Notified: 2036 Method of Notification: Page Notification Reason: Other (Comment) (HR 130-140) Provider response: See new orders Date of Provider Response: 08/14/22 Time of Provider Response: 2046 Notify: Rapid Response Name of Rapid Response RN Notified: Linus Salmons, RN Date Rapid Response Notified:  08/14/22 Time Rapid Response Notified: 2030      [name] 08/15/2022, 12:11 AM

## 2022-08-15 NOTE — Progress Notes (Signed)
Subjective:  Denies SSCP, palpitations or Dyspnea HR;s in 80's to low 90's   Objective:  Vitals:   08/15/22 0156 08/15/22 0426 08/15/22 0428 08/15/22 0751  BP:  119/70    Pulse: 74  86 83  Resp: 20  (!) 22 (!) 25  Temp:  97.6 F (36.4 C)    TempSrc:  Oral    SpO2: 91%  97% 97%  Weight:  67.9 kg    Height:        Intake/Output from previous day:  Intake/Output Summary (Last 24 hours) at 08/15/2022 0814 Last data filed at 08/15/2022 0428 Gross per 24 hour  Intake 1568 ml  Output 550 ml  Net 1018 ml    Physical Exam: Chronically ill Lungs Decreased BS left base  JVP elevated apical MR murmur  Abdomen benign Trace edema  Lab Results: Basic Metabolic Panel: Recent Labs    08/13/22 1215 08/13/22 1244 08/14/22 0406  NA 134*  --  139  K 3.6  --  3.2*  CL 104  --  108  CO2 23  --  23  GLUCOSE 100*  --  101*  BUN 12  --  11  CREATININE 0.99  --  0.82  CALCIUM 8.0*  --  7.5*  MG  --  2.3 2.4   Liver Function Tests: Recent Labs    08/13/22 1053  AST 21  ALT 27  ALKPHOS 98  BILITOT 0.7  PROT 5.7*  ALBUMIN 3.4*   No results for input(s): "LIPASE", "AMYLASE" in the last 72 hours. CBC: Recent Labs    08/13/22 1053 08/13/22 1215 08/14/22 0406  WBC 6.5 6.6 3.9*  NEUTROABS 4.9  --   --   HGB 9.8* 10.3* 9.4*  HCT 30.4* 31.6* 29.6*  MCV 103.8* 103.9* 106.1*  PLT 315 324 282    Thyroid Function Tests: Recent Labs    08/13/22 1244  TSH 5.612*   Anemia Panel: Recent Labs    08/13/22 1054 08/13/22 1055  VITAMINB12 1,618*  --   FOLATE >40.0  --   FERRITIN 175  --   TIBC  --  255  IRON  --  73  RETICCTPCT 8.4*  --     Imaging: CT Chest W Contrast  Result Date: 08/13/2022 CLINICAL DATA:  Pneumonia, complication suspected, xray done EXAM: CT CHEST WITH CONTRAST TECHNIQUE: Multidetector CT imaging of the chest was performed during intravenous contrast administration. RADIATION DOSE REDUCTION: This exam was performed according to the  departmental dose-optimization program which includes automated exposure control, adjustment of the mA and/or kV according to patient size and/or use of iterative reconstruction technique. CONTRAST:  80mL OMNIPAQUE IOHEXOL 300 MG/ML  SOLN COMPARISON:  Radiograph earlier today.  PET CT 07/02/2022 FINDINGS: Cardiovascular: Multi chamber cardiomegaly, with primarily biatrial enlargement. Moderate atherosclerosis of the thoracic aorta, no dissection. There is no pulmonary embolus, excellent pulmonary arterial opacification on this non dedicated arterial exam. No pericardial effusion. Mediastinum/Nodes: Small hiatal hernia. Scattered small mediastinal nodes are all subcentimeter, not enlarged by size criteria. No axillary adenopathy, prior left axillary node dissection. Lungs/Pleura: There are small right and moderate left pleural effusion. The left pleural effusion has increased from prior PET. The right pleural effusion is new. No convincing pleural nodularity or enhancement. Adjacent compressive as well as bandlike atelectasis. Treatment related changes in the left anterior hemithorax. No airspace disease to suggest pneumonia. No suspicious pulmonary nodule or mass. Trachea and central airways are clear. Upper Abdomen: Mild body wall edema in  the upper abdomen. Fat containing left adrenal nodule measuring 2 cm, unchanged and not hypermetabolic on PET. Musculoskeletal: The metastatic disease involving the left clavicular head on prior PET is only faintly visualized with slight cortical thinning on CT. There is slight heterogeneity of the left anterior T9 rib. Multilevel degenerative change in the spine. Postsurgical change in the left breast. IMPRESSION: 1. Small right and moderate left pleural effusions. The left pleural effusion has increased from prior PET. The right pleural effusion is new. 2. Adjacent compressive as well as bandlike atelectasis. No airspace disease to suggest pneumonia. 3. Multi chamber  cardiomegaly, with primarily biatrial enlargement. 4. The metastatic disease involving the left clavicular head on prior PET is only faintly visualized with slight cortical thinning on CT. There is slight heterogeneity of the left anterior T9 rib. Aortic Atherosclerosis (ICD10-I70.0). Electronically Signed   By: Narda Rutherford M.D.   On: 08/13/2022 17:41   DG Chest Port 1 View  Result Date: 08/13/2022 CLINICAL DATA:  Atrial fibrillation EXAM: PORTABLE CHEST 1 VIEW COMPARISON:  Previous chest radiographs done on 02/22/2020, CT chest done on 02/05/2021 FINDINGS: Transverse diameter of heart is increased. There is poor inspiration. There are no signs of alveolar pulmonary edema. There is increased density in left lower lung field. Surgical clips are seen in left chest wall. IMPRESSION: Cardiomegaly. There are no signs of alveolar pulmonary edema. Increased density in left lower lung field may suggest small pleural effusion and underlying atelectasis/pneumonia. Electronically Signed   By: Ernie Avena M.D.   On: 08/13/2022 13:25    Cardiac Studies:  ECG: afib nonspecific ST changes    Telemetry: afib rates 80's to low 90s'  Echo: EF 60-65% severe bi atrial enlargement mild/mod MR  Medications:    apixaban  5 mg Oral BID   atorvastatin  40 mg Oral QAC supper   digoxin  0.125 mg Oral Daily   dorzolamide  1 drop Both Eyes QHS   And   timolol  1 drop Both Eyes QHS   gabapentin  400 mg Oral BID   latanoprost  1 drop Both Eyes QHS   levETIRAcetam  500 mg Oral QHS   metoprolol tartrate  25 mg Oral Q6H   mometasone-formoterol  2 puff Inhalation BID   Netarsudil Dimesylate  1 drop Both Eyes QHS   thyroid  75 mg Oral QAC breakfast      Assessment/Plan:   PAF:  Known in setting of ? Pneumonia and worsening metastatic breast cancer. Rates resting ok. See med changes by Dr hochrein now on lopressor 25 mg q6 and digoxin.  Continue Eliquis 5 mg bid With severe bi atrial enlargement likely  ability to maintain any sinus is quite low Per Dr Myna Hidalgo Rx with Enhertu not likely to increase risk of PAF. Current left pleural effusion not significant enough to suggest thoracentesis Her EF is normal and BNP only 548 She is net positive over a liter will give low dose PO lasix one time today  Charlton Haws 08/15/2022, 8:14 AM

## 2022-08-15 NOTE — Consult Note (Signed)
Consultation Note Date: 08/15/2022   Patient Name: Patricia [name]  DOB: 1940-06-18  MRN: [id number]  Age / Sex: 82 y.o., female  PCP: [name], PA Referring Physician: Glade Lloyd, MD  Reason for Consultation: Goals of care  HPI/Patient Profile: 82 y.o. female  with past medical history of breast cancer on treatment with Enhertu that is stable per scans- tolerating it well, admitted on 08/13/2022 with PAF, and pleural effusions.    Primary Decision Maker PATIENT Surrogates designated by [employer]- Francis Dowse, Magnus Ivan, Alpha Gula (to serve in that order, with request for children to consult each other if they are serving as HCPOA)  Discussion: Chart reviewed including labs, progress notes, imaging from this and previous encounters.  She has HCPOA and living will scanned into [facility].  She lives at home with her spouse. Feeling well prior to this incident.  Feels back to her normal self today and very eager to go home. No pain or other symptom burden. She has DNR in place.  Her goals of care are to stabilize and discharge home.   SUMMARY OF RECOMMENDATIONS -Continue current plan of care -Hopeful for D/C home soon -No further Palliative needs identified- please contact PMT provider if needed   Code Status/Advance Care Planning: DNR   Prognosis:   Unable to determine  Discharge Planning: To Be Determined  Primary Diagnoses: Present on Admission:  Atrial fibrillation with RVR (HCC)  Essential hypertension  CKD (chronic kidney disease), stage III (HCC)  A-fib (HCC)   Review of Systems  All other systems reviewed and are negative.   Physical Exam Vitals and nursing note reviewed.  Constitutional:      Appearance: Normal appearance.  Skin:    General: Skin is warm and dry.  Neurological:     Mental Status: She is oriented to person, place, and time.      Vital Signs: BP 119/70 (BP Location: Right Arm)   Pulse (!) 106   Temp 97.6 F (36.4 C) (Oral)   Resp 20   Ht 5' (1.524 m)   Wt 67.9 kg   SpO2 94%   BMI 29.22 kg/m  Pain Scale: 0-10 POSS *See Group Information*: 1-Acceptable,Awake and alert Pain Score: 0-No pain   SpO2: SpO2: 94 % O2 Device:SpO2: 94 % O2 Flow Rate: .O2 Flow Rate (L/min): 0.5 L/min  IO: Intake/output summary:  Intake/Output Summary (Last 24 hours) at 08/15/2022 1122 Last data filed at 08/15/2022 0428 Gross per 24 hour  Intake 1320 ml  Output 550 ml  Net 770 ml    LBM: Last BM Date : 08/14/22 Baseline Weight: Weight: 69.3 kg Most recent weight: Weight: 67.9 kg       Thank you for this consult. Palliative medicine will continue to follow and assist as needed.   Greater than 50%  of this time was spent counseling and coordinating care related to the above assessment and plan.  Signed by: Ocie Bob, AGNP-C Palliative Medicine    Please contact Palliative Medicine Team phone  at [phone] for questions and concerns.  For individual provider: See Shea Evans

## 2022-08-15 NOTE — Progress Notes (Signed)
PROGRESS NOTE    Mohogany Sonday  ZOX:[id number] DOB: 1940/11/12 DOA: 08/13/2022 PCP: [name], PA   Brief Narrative:  82 y.o. 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  Cardiology consulted.  Assessment & Plan:   Permanent A-fib with RVR -Initially started on Cardizem drip but was subsequently discontinued because of hypotension.  Rate mostly controlled currently. Continue apixaban -Cardiology following: Currently on digoxin and metoprolol tartrate   Acute on chronic diastolic CHF Hypotension History of hypertension  mild to moderate mitral valve regurgitation and moderate to severe tricuspid valve regurgitation and mild to moderate aortic valve regurgitation.  Hyperlipidemia -Echo on 07/21/2022 had shown EF of 60 to 65% with mild to moderate mitral valve regurgitation and moderate to severe tricuspid valve regurgitation and mild to moderate aortic valve regurgitation.  - Strict input and output.  Daily weights.  Fluid restriction.  Blood pressure intermittently on the lower side.  Initially started on IV Lasix but subsequently held on 08/14/2022 by cardiology because of hypotension.  Follow cardiology recommendations. -Continue statin  Bilateral pleural effusion -Left more than right on CT imaging -Most likely from CHF exacerbation.  Currently on room air and respiratory status stable: If symptoms do not improve, might need thoracentesis.  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  Oncology following.  CKD stage  IIIa -Creatinine stable.  Hypokalemia -Labs pending today  Hypothyroidism -Continue replacement  History of recent left femur trochanteric nail placement for left femur metastasis -Had surgical intervention on 07/28/2022 by orthopedics.  Wound healing well.  Outpatient follow-up with orthopedics.  PT recommends home with PT.  TOC following.  Goals of care -Prognosis is guarded to poor.  Consulted palliative care for goals of care discussion.   -Discussed with husband on phone on 08/14/2022 who indicated that patient/family does not want any CPR or intubation for the patient but are okay with shock for A-fib/cardiac arrest if needed   DVT prophylaxis: Eliquis Code Status: No CPR or intubation; ok with shock for Afib/cardiac arrest as per husband on phone on 08/14/2022 Family Communication: Husband at bedside  disposition Plan: Status is:  inpatient because: Of severity of illness.    Consultants: Cardiology.  Palliative care  Procedures: None  Antimicrobials: None   Subjective: Patient seen and examined at bedside.  Still slightly short of breath with exertion but feels a little better today.  Denies any fever, chest pain, vomiting Objective: Vitals:   08/15/22 0156 08/15/22 0426 08/15/22 0428 08/15/22 0751  BP:  119/70    Pulse: 74  86 83  Resp: 20  (!) 22 (!) 25  Temp:  97.6 F (36.4 C)    TempSrc:  Oral    SpO2: 91%  97% 97%  Weight:  67.9 kg    Height:        Intake/Output Summary (Last 24 hours) at 08/15/2022 0820 Last data filed at 08/15/2022 0428 Gross per 24 hour  Intake 1568 ml  Output 550 ml  Net 1018 ml    Filed Weights   08/13/22 1211 08/15/22 0426  Weight: 69.3 kg 67.9 kg    Examination:  General: On room air.  No distress.  Looks chronically ill and deconditioned. ENT/neck: No thyromegaly.  JVD is not elevated  respiratory: Decreased breath sounds at bases bilaterally with some crackles; no wheezing.  Intermittently tachypneic  CVS: S1-S2  heard, rate controlled currently Abdominal: Soft, nontender, slightly distended; no organomegaly, bowel sounds are heard Extremities: Trace lower extremity edema; no cyanosis  CNS: Awake and alert.  Slow to respond.  Poor historian.  No focal neurologic deficit.  Moves extremities Lymph: No obvious lymphadenopathy Skin: Left lower extremity bruising present  psych: Mostly flat affect.  Not agitated currently.   Musculoskeletal: No obvious joint swelling/deformity     Data Reviewed: I have personally reviewed following labs and imaging studies  CBC: Recent Labs  Lab 08/10/22 1052 08/13/22 1053 08/13/22 1215 08/14/22 0406  WBC 5.1 6.5 6.6 3.9*  NEUTROABS 3.1 4.9  --   --   HGB 9.6* 9.8* 10.3* 9.4*  HCT 30.3* 30.4* 31.6* 29.6*  MCV 102.7* 103.8* 103.9* 106.1*  PLT 307 315 324 282    Basic Metabolic Panel: Recent Labs  Lab 08/10/22 1052 08/13/22 1053 08/13/22 1215 08/13/22 1244 08/14/22 0406  NA 138 138 134*  --  139  K 3.9 4.0 3.6  --  3.2*  CL 108 105 104  --  108  CO2 21* 25 23  --  23  GLUCOSE 109* 102* 100*  --  101*  BUN 13 13 12   --  11  CREATININE 1.10* 1.05* 0.99  --  0.82  CALCIUM 7.7* 8.7* 8.0*  --  7.5*  MG  --   --   --  2.3 2.4    GFR: Estimated Creatinine Clearance: 46.3 mL/min (by C-G formula based on SCr of 0.82 mg/dL). Liver Function Tests: Recent Labs  Lab 08/10/22 1052 08/13/22 1053  AST 26 21  ALT 34 27  ALKPHOS 80 98  BILITOT 0.7 0.7  PROT 5.6* 5.7*  ALBUMIN 3.3* 3.4*    No results for input(s): "LIPASE", "AMYLASE" in the last 168 hours. No results for input(s): "AMMONIA" in the last 168 hours. Coagulation Profile: No results for input(s): "INR", "PROTIME" in the last 168 hours. Cardiac Enzymes: No results for input(s): "CKTOTAL", "CKMB", "CKMBINDEX", "TROPONINI" in the last 168 hours. BNP (last 3 results) No results for input(s): "PROBNP" in the last 8760 hours. HbA1C: No results for input(s): "HGBA1C" in the last 72  hours. CBG: No results for input(s): "GLUCAP" in the last 168 hours. Lipid Profile: No results for input(s): "CHOL", "HDL", "LDLCALC", "TRIG", "CHOLHDL", "LDLDIRECT" in the last 72 hours. Thyroid Function Tests: Recent Labs    08/13/22 1244  TSH 5.612*  FREET4 0.55*    Anemia Panel: Recent Labs    08/13/22 1054 08/13/22 1055  VITAMINB12 1,618*  --   FOLATE >40.0  --   FERRITIN 175  --   TIBC  --  255  IRON  --  73  RETICCTPCT 8.4*  --     Sepsis Labs: No results for input(s): "PROCALCITON", "LATICACIDVEN" in the last 168 hours.  No results found for this or any previous visit (from the past 240 hour(s)).       Radiology Studies: CT Chest W Contrast  Result Date: 08/13/2022 CLINICAL DATA:  Pneumonia, complication suspected, xray done EXAM: CT CHEST WITH CONTRAST TECHNIQUE: Multidetector CT imaging of the chest was performed during intravenous contrast administration. RADIATION DOSE REDUCTION: This exam was performed according to the departmental dose-optimization program which includes automated exposure control, adjustment of the mA and/or kV according to patient  size and/or use of iterative reconstruction technique. CONTRAST:  80mL OMNIPAQUE IOHEXOL 300 MG/ML  SOLN COMPARISON:  Radiograph earlier today.  PET CT 07/02/2022 FINDINGS: Cardiovascular: Multi chamber cardiomegaly, with primarily biatrial enlargement. Moderate atherosclerosis of the thoracic aorta, no dissection. There is no pulmonary embolus, excellent pulmonary arterial opacification on this non dedicated arterial exam. No pericardial effusion. Mediastinum/Nodes: Small hiatal hernia. Scattered small mediastinal nodes are all subcentimeter, not enlarged by size criteria. No axillary adenopathy, prior left axillary node dissection. Lungs/Pleura: There are small right and moderate left pleural effusion. The left pleural effusion has increased from prior PET. The right pleural effusion is new. No convincing pleural  nodularity or enhancement. Adjacent compressive as well as bandlike atelectasis. Treatment related changes in the left anterior hemithorax. No airspace disease to suggest pneumonia. No suspicious pulmonary nodule or mass. Trachea and central airways are clear. Upper Abdomen: Mild body wall edema in the upper abdomen. Fat containing left adrenal nodule measuring 2 cm, unchanged and not hypermetabolic on PET. Musculoskeletal: The metastatic disease involving the left clavicular head on prior PET is only faintly visualized with slight cortical thinning on CT. There is slight heterogeneity of the left anterior T9 rib. Multilevel degenerative change in the spine. Postsurgical change in the left breast. IMPRESSION: 1. Small right and moderate left pleural effusions. The left pleural effusion has increased from prior PET. The right pleural effusion is new. 2. Adjacent compressive as well as bandlike atelectasis. No airspace disease to suggest pneumonia. 3. Multi chamber cardiomegaly, with primarily biatrial enlargement. 4. The metastatic disease involving the left clavicular head on prior PET is only faintly visualized with slight cortical thinning on CT. There is slight heterogeneity of the left anterior T9 rib. Aortic Atherosclerosis (ICD10-I70.0). Electronically Signed   By: Narda Rutherford M.D.   On: 08/13/2022 17:41   DG Chest Port 1 View  Result Date: 08/13/2022 CLINICAL DATA:  Atrial fibrillation EXAM: PORTABLE CHEST 1 VIEW COMPARISON:  Previous chest radiographs done on 02/22/2020, CT chest done on 02/05/2021 FINDINGS: Transverse diameter of heart is increased. There is poor inspiration. There are no signs of alveolar pulmonary edema. There is increased density in left lower lung field. Surgical clips are seen in left chest wall. IMPRESSION: Cardiomegaly. There are no signs of alveolar pulmonary edema. Increased density in left lower lung field may suggest small pleural effusion and underlying  atelectasis/pneumonia. Electronically Signed   By: Ernie Avena M.D.   On: 08/13/2022 13:25        Scheduled Meds:  apixaban  5 mg Oral BID   atorvastatin  40 mg Oral QAC supper   digoxin  0.125 mg Oral Daily   dorzolamide  1 drop Both Eyes QHS   And   timolol  1 drop Both Eyes QHS   gabapentin  400 mg Oral BID   latanoprost  1 drop Both Eyes QHS   levETIRAcetam  500 mg Oral QHS   metoprolol tartrate  25 mg Oral Q6H   mometasone-formoterol  2 puff Inhalation BID   Netarsudil Dimesylate  1 drop Both Eyes QHS   thyroid  75 mg Oral QAC breakfast   Continuous Infusions:          Glade Lloyd, MD [facility] 08/15/2022, 8:20 AM

## 2022-08-15 NOTE — Progress Notes (Signed)
Right AC PIV removed this shift for possible phlebitis, grade 1. Pt denies pain to the area and refuses hot/cold interventions. Pharmacy notified. IV team consulted for new access site.

## 2022-08-15 NOTE — Progress Notes (Signed)
Due to limited PIV access sites and pt not receiving anything per IV route, Dr Hanley Ben in agreement not to place a new PIV at this time.

## 2022-08-16 DIAGNOSIS — I4891 Unspecified atrial fibrillation: Secondary | ICD-10-CM | POA: Diagnosis not present

## 2022-08-16 LAB — CBC WITH DIFFERENTIAL/PLATELET
Abs Immature Granulocytes: 0.04 10*3/uL (ref 0.00–0.07)
Basophils Absolute: 0 10*3/uL (ref 0.0–0.1)
Basophils Relative: 1 %
Eosinophils Absolute: 0 10*3/uL (ref 0.0–0.5)
Eosinophils Relative: 0 %
HCT: 32.5 % — ABNORMAL LOW (ref 36.0–46.0)
Hemoglobin: 10.1 g/dL — ABNORMAL LOW (ref 12.0–15.0)
Immature Granulocytes: 1 %
Lymphocytes Relative: 18 %
Lymphs Abs: 0.9 10*3/uL (ref 0.7–4.0)
MCH: 33 pg (ref 26.0–34.0)
MCHC: 31.1 g/dL (ref 30.0–36.0)
MCV: 106.2 fL — ABNORMAL HIGH (ref 80.0–100.0)
Monocytes Absolute: 0.6 10*3/uL (ref 0.1–1.0)
Monocytes Relative: 12 %
Neutro Abs: 3.3 10*3/uL (ref 1.7–7.7)
Neutrophils Relative %: 68 %
Platelets: 297 10*3/uL (ref 150–400)
RBC: 3.06 MIL/uL — ABNORMAL LOW (ref 3.87–5.11)
RDW: 20.4 % — ABNORMAL HIGH (ref 11.5–15.5)
WBC: 4.8 10*3/uL (ref 4.0–10.5)
nRBC: 0 % (ref 0.0–0.2)

## 2022-08-16 LAB — BASIC METABOLIC PANEL
Anion gap: 7 (ref 5–15)
BUN: 14 mg/dL (ref 8–23)
CO2: 22 mmol/L (ref 22–32)
Calcium: 7.9 mg/dL — ABNORMAL LOW (ref 8.9–10.3)
Chloride: 109 mmol/L (ref 98–111)
Creatinine, Ser: 0.78 mg/dL (ref 0.44–1.00)
GFR, Estimated: >60 mL/min (ref >60)
Glucose, Bld: 104 mg/dL — ABNORMAL HIGH (ref 70–99)
Potassium: 3.9 mmol/L (ref 3.5–5.1)
Sodium: 138 mmol/L (ref 135–145)

## 2022-08-16 LAB — MAGNESIUM: Magnesium: 2.2 mg/dL (ref 1.7–2.4)

## 2022-08-16 MED ORDER — THYROID 15 MG PO TABS
15.0000 mg | ORAL_TABLET | Freq: Every day | ORAL | Status: DC
Start: 2022-08-16 — End: 2022-10-22

## 2022-08-16 MED ORDER — METOPROLOL TARTRATE 50 MG PO TABS: 50.0000 mg | ORAL_TABLET | Freq: Two times a day (BID) | ORAL | 0 refills | Status: DC

## 2022-08-16 MED ORDER — DIGOXIN 125 MCG PO TABS: 0.1250 mg | ORAL_TABLET | Freq: Every day | ORAL | 0 refills | Status: DC

## 2022-08-16 MED ORDER — FUROSEMIDE 40 MG PO TABS: 20.0000 mg | ORAL_TABLET | Freq: Every day | ORAL | Status: DC | PRN

## 2022-08-16 MED ORDER — THYROID 60 MG PO TABS
60.0000 mg | ORAL_TABLET | Freq: Every day | ORAL | Status: DC
Start: 2022-08-16 — End: 2022-10-22

## 2022-08-16 NOTE — Discharge Summary (Signed)
Physician Discharge Summary  Patricia [name] WUJ:[id number] DOB: Aug 21, 1940 DOA: 08/13/2022  PCP: [name], PA  Admit date: 08/13/2022 Discharge date: 08/16/2022  Admitted From: Home Disposition: Home  Recommendations for Outpatient Follow-up:  Follow up with PCP in 1 week with repeat CBC/BMP Outpatient follow-up with cardiology and oncology Follow up in ED if symptoms worsen or new appear   Home Health: Home with PT/OT Equipment/Devices: None  Discharge Condition: Guarded  CODE STATUS: DNR: No CPR or intubation; ok with shock for Afib/cardiac arrest  Diet recommendation: Heart healthy/fluid restriction of up to 1500 cc a day  Brief/Interim Summary: 82 y.o. 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  Cardiology consulted.  She was initially on Cardizem drip and IV Lasix which was discontinued because of hypotension.  She was switched to oral metoprolol and digoxin.  Heart rate has improved.  She received a dose of oral Lasix on 08/15/2022.  She feels much better.  Cardiology has cleared the patient for discharge.  She will be discharged home today on oral digoxin and metoprolol as per cardiology recommendations.  Outpatient follow-up with PCP and cardiology.  Discharge Diagnoses:   Permanent A-fib with RVR -Initially started on Cardizem drip but was subsequently discontinued because of hypotension.  Rate mostly controlled currently. Continue apixaban -Cardiology following: Currently on digoxin and metoprolol tartrate  -She feels much better.  Cardiology has cleared the patient for discharge.  She will be discharged home today on oral  digoxin and metoprolol as per cardiology recommendations.  Outpatient follow-up with PCP and cardiology.   Acute on chronic diastolic CHF Hypotension History of hypertension  mild to moderate mitral valve regurgitation and moderate to severe tricuspid valve regurgitation and mild to moderate aortic valve regurgitation.  Hyperlipidemia -Echo on 07/21/2022 had shown EF of 60 to 65% with mild to moderate mitral valve regurgitation and moderate to severe tricuspid valve regurgitation and mild to moderate aortic valve regurgitation.  -Continue diet and fluid restriction.  Blood pressure intermittently on the lower side.  Initially started on IV Lasix but subsequently held on 08/14/2022 by cardiology because of hypotension.  She received a dose of oral Lasix on 08/15/2022 as per cardiology.  Cardiology recommends Lasix 20 mg daily as needed for weight gain of more than 3 pounds or increasing shortness of breath.  Outpatient follow-up with cardiology. -Continue statin   Bilateral pleural effusion -Left more than right on CT imaging -Most likely from CHF exacerbation.  Currently on room air and respiratory status stable.  No need for thoracentesis at this time.  Outpatient follow-up.  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  Oncology following.  Outpatient follow-up with oncology.   CKD stage IIIa -Creatinine stable.   Hypokalemia -Improving.  Continue supplementation as an outpatient.  Outpatient follow-up.  Hypothyroidism -Continue replacement   History of recent left femur trochanteric nail placement for left femur metastasis -Had surgical intervention on 07/28/2022 by orthopedics.  Wound healing well.  Outpatient follow-up with orthopedics.  PT recommends home with PT. outpatient follow-up with orthopedics.   Goals of care -Prognosis is guarded.  Palliative care evaluation  appreciated. -Discussed with husband on phone on 08/14/2022  who indicated that patient/family does not want any CPR or intubation for the patient but are okay with shock for A-fib/cardiac arrest if needed   Discharge Instructions  Discharge Instructions     Ambulatory referral to Cardiology   Complete by: As directed    Hospital follow-up   Diet - low sodium heart healthy   Complete by: As directed    Increase activity slowly   Complete by: As directed       Allergies as of 08/16/2022       Reactions   Combigan [brimonidine Tartrate-timolol] Itching   Per patient made eyes red, sore, and sensitivity to light   Sulfa Antibiotics Hives, Rash   Sulfamethoxazole-trimethoprim Hives        Medication List     STOP taking these medications    diltiazem 300 MG 24 hr capsule Commonly known as: CARDIZEM CD   metoprolol succinate 50 MG 24 hr tablet Commonly known as: TOPROL-XL       TAKE these medications    albuterol 108 (90 Base) MCG/ACT inhaler Commonly known as: VENTOLIN HFA Inhale 2 puffs into the lungs every 6 (six) hours as needed for wheezing or shortness of breath.   apixaban 5 MG Tabs tablet Commonly known as: Eliquis Take 1 tablet (5 mg total) by mouth 2 (two) times daily.   atorvastatin 40 MG tablet Commonly known as: LIPITOR Take 1 tablet (40 mg total) by mouth daily before supper.   bimatoprost 0.01 % Soln Commonly known as: LUMIGAN Place 1 drop into both eyes 2 (two) times daily.   calcium-vitamin D 500-200 MG-UNIT tablet Commonly known as: OSCAL WITH D Take 1 tablet by mouth daily with breakfast.   Cerefolin 07-31-48-5 MG Tabs Take 1 tablet by mouth every morning.   dexamethasone 4 MG tablet Commonly known as: DECADRON Take 2 tablets (8 mg) by mouth daily for 3 days starting the day after chemotherapy. Take with food.   digoxin 0.125 MG tablet Commonly known as: LANOXIN Take 1 tablet (0.125 mg total) by mouth daily. Start taking on: August 17, 2022   dorzolamide-timolol 2-0.5 % ophthalmic solution Commonly known as: COSOPT Place 1 drop into both eyes at bedtime.   fluticasone 50 MCG/ACT nasal spray Commonly known as: FLONASE 1 spray each nostril following sinus rinses twice daily   fluticasone-salmeterol 230-21 MCG/ACT inhaler Commonly known as: Advair HFA USE 2 INHALATIONS TWICE A DAY   furosemide 40 MG tablet Commonly known as: LASIX Take 0.5 tablets (20 mg total) by mouth daily as needed for edema or fluid (weight gain of >3 lbs or worsening dyspnea). What changed:  how much to take when to take this reasons to take this   gabapentin 400 MG capsule Commonly known as: NEURONTIN TAKE 1 CAPSULE TWICE A DAY   levETIRAcetam 500 MG 24 hr tablet Commonly known as: KEPPRA XR Take 1 tablet (500 mg total) by mouth at bedtime.   loratadine 10 MG tablet Commonly known as: CLARITIN Take 10 mg by mouth daily.   memantine 10 MG tablet Commonly known as: Namenda Take 1 tablet (10 mg total) by mouth 2 (two) times daily.   metoprolol tartrate 50 MG tablet Commonly known as: LOPRESSOR Take 1 tablet (50 mg total) by mouth 2 (two) times daily.   Multivitamin/Iron Tabs Take 1 tablet by mouth daily.   ondansetron 8 MG tablet Commonly known as: Zofran Take 1 tablet (8 mg total) by mouth every 8 (eight) hours as needed for nausea or  vomiting. Start on the third day after chemotherapy.   OVER THE COUNTER MEDICATION Take 1 tablet by mouth daily. Zyflamend otc herbal supplement   pantoprazole 40 MG tablet Commonly known as: PROTONIX TAKE 1 TABLET DAILY   potassium chloride SA 20 MEQ tablet Commonly known as: KLOR-CON M TAKE 1 TABLET TWICE A DAY   Prevagen 10 MG Caps Generic drug: Apoaequorin Take 1 capsule by mouth every morning.   prochlorperazine 10 MG tablet Commonly known as: COMPAZINE Take 1 tablet (10 mg total) by mouth every 6 (six) hours as needed for nausea or vomiting.   Rhopressa 0.02 % Soln Generic  drug: Netarsudil Dimesylate Place 1 drop into both eyes at bedtime.   thyroid 15 MG tablet Commonly known as: Armour Thyroid Take 1 tablet (15 mg total) by mouth daily. Take with 60mg  for a total dose of 75mg  daily. What changed: See the new instructions.   thyroid 60 MG tablet Commonly known as: Armour Thyroid Take 1 tablet (60 mg total) by mouth daily. Takes with 15mg  daily. What changed:  how much to take when to take this additional instructions   traMADol 50 MG tablet Commonly known as: ULTRAM Take 1 tablet (50 mg total) by mouth every 6 (six) hours as needed (post op pain).   Vitamin D (Ergocalciferol) 1.25 MG (50000 UNIT) Caps capsule Commonly known as: DRISDOL TAKE 1 CAPSULE WEEKLY        Follow-up Information     [name], PA. Schedule an appointment as soon as possible for a visit in 1 week(s).   Specialty: Family Medicine Contact information: [address] [place] [address] [phone]                Allergies  Allergen Reactions   Combigan [Brimonidine Tartrate-Timolol] Itching    Per patient made eyes red, sore, and sensitivity to light   Sulfa Antibiotics Hives and Rash   Sulfamethoxazole-Trimethoprim Hives    Consultations: Cardiology/oncology/palliative care   Procedures/Studies: CT Chest W Contrast  Result Date: 08/13/2022 CLINICAL DATA:  Pneumonia, complication suspected, xray done EXAM: CT CHEST WITH CONTRAST TECHNIQUE: Multidetector CT imaging of the chest was performed during intravenous contrast administration. RADIATION DOSE REDUCTION: This exam was performed according to the departmental dose-optimization program which includes automated exposure control, adjustment of the mA and/or kV according to patient size and/or use of iterative reconstruction technique. CONTRAST:  80mL OMNIPAQUE IOHEXOL 300 MG/ML  SOLN COMPARISON:  Radiograph earlier today.  PET CT 07/02/2022 FINDINGS: Cardiovascular: Multi chamber  cardiomegaly, with primarily biatrial enlargement. Moderate atherosclerosis of the thoracic aorta, no dissection. There is no pulmonary embolus, excellent pulmonary arterial opacification on this non dedicated arterial exam. No pericardial effusion. Mediastinum/Nodes: Small hiatal hernia. Scattered small mediastinal nodes are all subcentimeter, not enlarged by size criteria. No axillary adenopathy, prior left axillary node dissection. Lungs/Pleura: There are small right and moderate left pleural effusion. The left pleural effusion has increased from prior PET. The right pleural effusion is new. No convincing pleural nodularity or enhancement. Adjacent compressive as well as bandlike atelectasis. Treatment related changes in the left anterior hemithorax. No airspace disease to suggest pneumonia. No suspicious pulmonary nodule or mass. Trachea and central airways are clear. Upper Abdomen: Mild body wall edema in the upper abdomen. Fat containing left adrenal nodule measuring 2 cm, unchanged and not hypermetabolic on PET. Musculoskeletal: The metastatic disease involving the left clavicular head on prior PET is only faintly visualized with slight cortical thinning on CT.  There is slight heterogeneity of the left anterior T9 rib. Multilevel degenerative change in the spine. Postsurgical change in the left breast. IMPRESSION: 1. Small right and moderate left pleural effusions. The left pleural effusion has increased from prior PET. The right pleural effusion is new. 2. Adjacent compressive as well as bandlike atelectasis. No airspace disease to suggest pneumonia. 3. Multi chamber cardiomegaly, with primarily biatrial enlargement. 4. The metastatic disease involving the left clavicular head on prior PET is only faintly visualized with slight cortical thinning on CT. There is slight heterogeneity of the left anterior T9 rib. Aortic Atherosclerosis (ICD10-I70.0). Electronically Signed   By: Narda Rutherford M.D.   On:  08/13/2022 17:41   DG Chest Port 1 View  Result Date: 08/13/2022 CLINICAL DATA:  Atrial fibrillation EXAM: PORTABLE CHEST 1 VIEW COMPARISON:  Previous chest radiographs done on 02/22/2020, CT chest done on 02/05/2021 FINDINGS: Transverse diameter of heart is increased. There is poor inspiration. There are no signs of alveolar pulmonary edema. There is increased density in left lower lung field. Surgical clips are seen in left chest wall. IMPRESSION: Cardiomegaly. There are no signs of alveolar pulmonary edema. Increased density in left lower lung field may suggest small pleural effusion and underlying atelectasis/pneumonia. Electronically Signed   By: Ernie Avena M.D.   On: 08/13/2022 13:25   CT VENOGRAM ABD/PELVIS/LOWER EXT BILAT  Result Date: 08/10/2022 CLINICAL DATA:  Left leg edema and anemia following intramedullary nail fixation of the left femur following a pathological fracture with lytic lesions. The patient had a lung carcinoid tumor removed 2 years ago. Previous left lumpectomy for breast cancer in 2015 with subsequent left L5 vertebral body biopsied demonstrating metastatic breast cancer. Interval additional bone metastases. EXAM: CT VENOGRAM ABDOMEN AND PELVIS AND LOWER EXTREMITY BILATERAL TECHNIQUE: Venographic phase images of the abdomen, pelvis and lower extremities were obtained following the administration of intravenous contrast. Multiplanar reformats and maximum intensity projections were generated. RADIATION DOSE REDUCTION: This exam was performed according to the departmental dose-optimization program which includes automated exposure control, adjustment of the mA and/or kV according to patient size and/or use of iterative reconstruction technique. CONTRAST:  OMNIPAQUE IOHEXOL 350 MG/ML SOLN COMPARISON:  PET-CT dated 07/02/2022. Left L5 vertebral body CT biopsy dated 06/30/2021. Left femur radiographs dated 07/08/2022. FINDINGS: Lower chest: Small right pleural effusion  and moderate-sized left pleural effusion. Minimal bibasilar atelectasis/scarring. Hepatobiliary: No focal liver abnormality is seen. No gallstones, gallbladder wall thickening, or biliary dilatation. Pancreas: Unremarkable. No pancreatic ductal dilatation or surrounding inflammatory changes. Spleen: Normal in size without focal abnormality. 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 Unremarkable right adrenal gland, kidneys, ureters and urinary bladder. Stomach/Bowel: Multiple sigmoid and descending colon diverticula without evidence of diverticulitis. Unremarkable appendix, small bowel and stomach. Vascular/Lymphatic: No significant vascular findings are present. No enlarged abdominal or pelvic lymph nodes. Reproductive: Uterus and bilateral adnexa are unremarkable. Other: Tiny umbilical hernia containing fat. Musculoskeletal: Hardware fixation of the proximal left femur. This is bridging an intertrochanteric fracture. Small lytic cortical lesion in the anterior mid femoral shaft on the left, unchanged. Mixed sclerotic and lytic lesion throughout the L5 vertebral body corresponding to the known metastasis. Lumbar and lower thoracic spine degenerative changes. Mild left hip degenerative changes. Mild scoliosis. IVC: Suboptimally opacified.  No visible filling defects. Portal and mesenteric veins: Well opacified. No visible filling defects. Bilateral iliac veins: Suboptimally opacified. No visible filling defects. Right lower extremity: Suboptimally opacified veins. No visible filling defects. Mild subcutaneous edema.  No hematoma. Left lower extremity: Suboptimally opacified veins. No visible filling defects. Mild subcutaneous edema. Small amount of residual postoperative soft tissue air. No hematoma. IMPRESSION: 1. Suboptimal opacification of the IVC, bilateral iliac veins and bilateral lower extremity veins. No visible deep venous thrombus. 2. No hematoma. 3. Small right  pleural effusion and moderate-sized left pleural effusion. 4. 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 5. Sigmoid and descending colon diverticulosis. 6. Hardware fixation of the proximal left femur bridging an intertrochanteric fracture. 7. Small lytic cortical lesion in the anterior mid femoral shaft on the left, unchanged, compatible with a known metastasis. 8. Mixed sclerotic and lytic lesion throughout the L5 vertebral body corresponding to a known metastasis. Electronically Signed   By: Beckie Salts M.D.   On: 08/10/2022 14:05   DG FEMUR MIN 2 VIEWS LEFT  Result Date: 07/28/2022 CLINICAL DATA:  Elective surgery. EXAM: LEFT FEMUR 2 VIEWS COMPARISON:  Radiograph 06/08/2022 FINDINGS: Eighteen fluoroscopic spot views of the left femur obtained in the operating room. Sequential images during femoral intramedullary nail with trans trochanteric and distal locking screw fixation. Fluoroscopy time 61 seconds. Dose 4.7 mGy. IMPRESSION: Intraoperative fluoroscopy during left femoral intramedullary nail placement. Electronically Signed   By: Narda Rutherford M.D.   On: 07/28/2022 16:47   DG C-Arm 1-60 Min-No Report  Result Date: 07/28/2022 Fluoroscopy was utilized by the requesting physician.  No radiographic interpretation.   DG C-Arm 1-60 Min-No Report  Result Date: 07/28/2022 Fluoroscopy was utilized by the requesting physician.  No radiographic interpretation.   ECHOCARDIOGRAM COMPLETE  Result Date: 07/21/2022    ECHOCARDIOGRAM REPORT   Patient Name:   WINSLOW PRINDLE   Date of Exam: 07/21/2022 Medical Rec #:  [id number]     Height:       60.0 in Accession #:    [id number]    Weight:       150.0 lb Date of Birth:  [birth year]     BSA:          1.652 m Patient Age:    81 years      BP:           104/69 mmHg Patient Gender: F             HR:           97 bpm. Exam Location:  [address] Procedure: 2D Echo, Cardiac Doppler and Color Doppler Indications:    Z09 Chemo  History:        Patient has prior history of  Echocardiogram examinations, most                 recent 01/15/2022. CHF, Stroke, Breast cancer and Mitral Valve                 Disease, Arrythmias:Atrial Fibrillation, Signs/Symptoms:Lung                 cancer; Risk Factors:Hypertension and Dyslipidemia.  Sonographer:    Samule Ohm RDCS Referring Phys: [id number] PETER R ENNEVER  Sonographer Comments: Technically difficult study due to poor echo windows. IMPRESSIONS  1. Left ventricular ejection fraction, by estimation, is 60 to 65%. The left ventricle has normal function. The left ventricle has no regional wall motion abnormalities. There is mild left ventricular hypertrophy. Left ventricular diastolic parameters were normal.  2. Right ventricular systolic function is normal. The right ventricular size is mildly enlarged.  3. Left atrial size was severely dilated.  4. Right atrial size was severely dilated.  5.  The mitral valve is normal in structure. Mild to moderate mitral valve regurgitation. No evidence of mitral stenosis.  6. Tricuspid valve regurgitation is moderate to severe.  7. The aortic valve is tricuspid. Aortic valve regurgitation is mild to moderate. No aortic stenosis is present. Aortic regurgitation PHT measures 389 msec.  8. The inferior vena cava is normal in size with greater than 50% respiratory variability, suggesting right atrial pressure of 3 mmHg. FINDINGS  Left Ventricle: Left ventricular ejection fraction, by estimation, is 60 to 65%. The left ventricle has normal function. The left ventricle has no regional wall motion abnormalities. The left ventricular internal cavity size was normal in size. There is  mild left ventricular hypertrophy. Left ventricular diastolic parameters were normal. Right Ventricle: The right ventricular size is mildly enlarged. No increase in right ventricular wall thickness. Right ventricular systolic function is normal. Left Atrium: Left atrial size was severely dilated. Right Atrium: Right atrial size was  severely dilated. Pericardium: There is no evidence of pericardial effusion. Mitral Valve: The mitral valve is normal in structure. Mild to moderate mitral valve regurgitation. No evidence of mitral valve stenosis. Tricuspid Valve: The tricuspid valve is normal in structure. Tricuspid valve regurgitation is moderate to severe. No evidence of tricuspid stenosis. Aortic Valve: The aortic valve is tricuspid. Aortic valve regurgitation is mild to moderate. Aortic regurgitation PHT measures 389 msec. No aortic stenosis is present. Pulmonic Valve: The pulmonic valve was normal in structure. Pulmonic valve regurgitation is trivial. No evidence of pulmonic stenosis. Aorta: The aortic root is normal in size and structure. Venous: The inferior vena cava is normal in size with greater than 50% respiratory variability, suggesting right atrial pressure of 3 mmHg. IAS/Shunts: No atrial level shunt detected by color flow Doppler.  LEFT VENTRICLE PLAX 2D LVIDd:         3.30 cm   Diastology LVIDs:         2.40 cm   LV e' medial:    12.00 cm/s LV PW:         1.40 cm   LV E/e' medial:  11.9 LV IVS:        1.20 cm   LV e' lateral:   15.60 cm/s LVOT diam:     1.80 cm   LV E/e' lateral: 9.2 LV SV:         27 LV SV Index:   17 LVOT Area:     2.54 cm  RIGHT VENTRICLE            IVC RVSP:           44.5 mmHg  IVC diam: 0.80 cm LEFT ATRIUM              Index        RIGHT ATRIUM           Index LA diam:        5.40 cm  3.27 cm/m   RA Pressure: 3.00 mmHg LA Vol (A2C):   124.0 ml 75.07 ml/m  RA Area:     22.70 cm LA Vol (A4C):   127.0 ml 76.89 ml/m  RA Volume:   67.00 ml  40.56 ml/m LA Biplane Vol: 126.0 ml 76.28 ml/m  AORTIC VALVE LVOT Vmax:   62.94 cm/s LVOT Vmean:  43.480 cm/s LVOT VTI:    0.108 m AI PHT:      389 msec  AORTA Ao Root diam: 3.40 cm Ao Asc diam:  3.30 cm MITRAL VALVE  TRICUSPID VALVE MV Area (PHT): 4.67 cm     TR Peak grad:   41.5 mmHg MV Decel Time: 162 msec     TR Vmax:        322.00 cm/s MV E velocity:  143.20 cm/s  Estimated RAP:  3.00 mmHg                             RVSP:           44.5 mmHg                              SHUNTS                             Systemic VTI:  0.11 m                             Systemic Diam: 1.80 cm Donato Schultz MD Electronically signed by Donato Schultz MD Signature Date/Time: 07/21/2022/3:07:36 PM    Final       Subjective: Patient seen and examined at bedside.  She feels much better and feels okay to go home today.  Denies worsening chest pain or shortness of breath.  No fever or vomiting reported.  Discharge Exam: Vitals:   08/16/22 0848 08/16/22 0857  BP:  (!) 122/57  Pulse:  80  Resp:  20  Temp:  97.8 F (36.6 C)  SpO2: 97% 98%    General: Pt is alert, awake, not in acute distress.  Slow to respond, poor historian.  On room air. Cardiovascular: rate controlled, S1/S2 + Respiratory: bilateral decreased breath sounds at bases with some scattered crackles Abdominal: Soft, NT, ND, bowel sounds + Extremities: Mild lower extremity edema present, more on the left lower extremity; no cyanosis    The results of significant diagnostics from this hospitalization (including imaging, microbiology, ancillary and laboratory) are listed below for reference.     Microbiology: No results found for this or any previous visit (from the past 240 hour(s)).   Labs: BNP (last 3 results) Recent Labs    08/13/22 2037  BNP 548.3*   Basic Metabolic Panel: Recent Labs  Lab 08/10/22 1052 08/13/22 1053 08/13/22 1215 08/13/22 1244 08/14/22 0406 08/16/22 0420  NA 138 138 134*  --  139 138  K 3.9 4.0 3.6  --  3.2* 3.9  CL 108 105 104  --  108 109  CO2 21* 25 23  --  23 22  GLUCOSE 109* 102* 100*  --  101* 104*  BUN 13 13 12   --  11 14  CREATININE 1.10* 1.05* 0.99  --  0.82 0.78  CALCIUM 7.7* 8.7* 8.0*  --  7.5* 7.9*  MG  --   --   --  2.3 2.4 2.2   Liver Function Tests: Recent Labs  Lab 08/10/22 1052 08/13/22 1053  AST 26 21  ALT 34 27  ALKPHOS 80 98   BILITOT 0.7 0.7  PROT 5.6* 5.7*  ALBUMIN 3.3* 3.4*   No results for input(s): "LIPASE", "AMYLASE" in the last 168 hours. No results for input(s): "AMMONIA" in the last 168 hours. CBC: Recent Labs  Lab 08/10/22 1052 08/13/22 1053 08/13/22 1215 08/14/22 0406 08/16/22 0420  WBC 5.1 6.5 6.6 3.9* 4.8  NEUTROABS 3.1 4.9  --   --  3.3  HGB 9.6* 9.8* 10.3* 9.4* 10.1*  HCT 30.3* 30.4* 31.6* 29.6* 32.5*  MCV 102.7* 103.8* 103.9* 106.1* 106.2*  PLT 307 315 324 282 297   Cardiac Enzymes: No results for input(s): "CKTOTAL", "CKMB", "CKMBINDEX", "TROPONINI" in the last 168 hours. BNP: Invalid input(s): "POCBNP" CBG: No results for input(s): "GLUCAP" in the last 168 hours. D-Dimer No results for input(s): "DDIMER" in the last 72 hours. Hgb A1c No results for input(s): "HGBA1C" in the last 72 hours. Lipid Profile No results for input(s): "CHOL", "HDL", "LDLCALC", "TRIG", "CHOLHDL", "LDLDIRECT" in the last 72 hours. Thyroid function studies Recent Labs    08/13/22 1244  TSH 5.612*   Anemia work up Recent Labs    08/13/22 1054 08/13/22 1055  VITAMINB12 1,618*  --   FOLATE >40.0  --   FERRITIN 175  --   TIBC  --  255  IRON  --  73  RETICCTPCT 8.4*  --    Urinalysis    Component Value Date/Time   COLORURINE YELLOW 08/13/2022 1530   APPEARANCEUR CLEAR 08/13/2022 1530   LABSPEC 1.015 08/13/2022 1530   PHURINE 7.0 08/13/2022 1530   GLUCOSEU NEGATIVE 08/13/2022 1530   HGBUR NEGATIVE 08/13/2022 1530   HGBUR negative 05/20/2007 0942   BILIRUBINUR NEGATIVE 08/13/2022 1530   BILIRUBINUR negative 12/05/2018 1540   KETONESUR NEGATIVE 08/13/2022 1530   PROTEINUR NEGATIVE 08/13/2022 1530   UROBILINOGEN 1.0 12/05/2018 1540   UROBILINOGEN 0.2 05/20/2007 0942   NITRITE NEGATIVE 08/13/2022 1530   LEUKOCYTESUR SMALL (A) 08/13/2022 1530   Sepsis Labs Recent Labs  Lab 08/13/22 1053 08/13/22 1215 08/14/22 0406 08/16/22 0420  WBC 6.5 6.6 3.9* 4.8   Microbiology No results  found for this or any previous visit (from the past 240 hour(s)).   Time coordinating discharge: 35 minutes  SIGNED:   Glade Lloyd, MD  [facility] 08/16/2022, 10:34 AM

## 2022-08-16 NOTE — Progress Notes (Signed)
Subjective:  Denies SSCP, palpitations or Dyspnea HR;s in 80's to low 90's   Objective:  Vitals:   08/15/22 2042 08/16/22 0033 08/16/22 0500 08/16/22 0630  BP:  103/62  118/69  Pulse:  73  86  Resp:    (!) 24  Temp:    97.8 F (36.6 C)  TempSrc:      SpO2: 97%   95%  Weight:   68.1 kg   Height:        Intake/Output from previous day:  Intake/Output Summary (Last 24 hours) at 08/16/2022 0755 Last data filed at 08/16/2022 0546 Gross per 24 hour  Intake 240 ml  Output 1250 ml  Net -1010 ml    Physical Exam: Chronically ill Lungs Decreased BS left base  JVP elevated apical MR murmur  Abdomen benign Trace edema  Lab Results: Basic Metabolic Panel: Recent Labs    08/14/22 0406 08/16/22 0420  NA 139 138  K 3.2* 3.9  CL 108 109  CO2 23 22  GLUCOSE 101* 104*  BUN 11 14  CREATININE 0.82 0.78  CALCIUM 7.5* 7.9*  MG 2.4 2.2   Liver Function Tests: Recent Labs    08/13/22 1053  AST 21  ALT 27  ALKPHOS 98  BILITOT 0.7  PROT 5.7*  ALBUMIN 3.4*   No results for input(s): "LIPASE", "AMYLASE" in the last 72 hours. CBC: Recent Labs    08/13/22 1053 08/13/22 1215 08/14/22 0406 08/16/22 0420  WBC 6.5   < > 3.9* 4.8  NEUTROABS 4.9  --   --  3.3  HGB 9.8*   < > 9.4* 10.1*  HCT 30.4*   < > 29.6* 32.5*  MCV 103.8*   < > 106.1* 106.2*  PLT 315   < > 282 297   < > = values in this interval not displayed.    Thyroid Function Tests: Recent Labs    08/13/22 1244  TSH 5.612*   Anemia Panel: Recent Labs    08/13/22 1054 08/13/22 1055  VITAMINB12 1,618*  --   FOLATE >40.0  --   FERRITIN 175  --   TIBC  --  255  IRON  --  73  RETICCTPCT 8.4*  --     Imaging: No results found.  Cardiac Studies:  ECG: afib nonspecific ST changes    Telemetry: afib rates 80's to low 90s'  Echo: EF 60-65% severe bi atrial enlargement mild/mod MR  Medications:    apixaban  5 mg Oral BID   atorvastatin  40 mg Oral QAC supper   digoxin  0.125 mg Oral Daily    dorzolamide  1 drop Both Eyes QHS   And   timolol  1 drop Both Eyes QHS   gabapentin  400 mg Oral BID   latanoprost  1 drop Both Eyes QHS   levETIRAcetam  500 mg Oral QHS   metoprolol tartrate  25 mg Oral Q6H   mometasone-formoterol  2 puff Inhalation BID   Netarsudil Dimesylate  1 drop Both Eyes QHS   thyroid  75 mg Oral QAC breakfast      Assessment/Plan:   PAF:  Known in setting of ? Pneumonia and worsening metastatic breast cancer. Rates resting ok. See med changes by Dr hochrein now on lopressor 25 mg q6 and digoxin.  Continue Eliquis 5 mg bid With severe bi atrial enlargement likely ability to maintain any sinus is quite low Per Dr Myna Hidalgo Rx with Enhertu not likely to increase risk of  PAF. Current left pleural effusion not significant enough to suggest thoracentesis Her EF is normal and BNP only 548 1 L diuresis with a dose of lasix yesterday If she is d/c today would change lopressor to 50 mg bid and give her PRN lasix 20 mg for weight gain more than 3 Lbs or dyspnea Will arrange outpatient fu with Dr Adele Dan [facility] 08/16/2022, 7:55 AM

## 2022-08-16 NOTE — Progress Notes (Signed)
Patient discharged: Home with family  Via: Wheelchair   Discharge paperwork given: to patient and family  Reviewed with teach back  IV and telemetry disconnected  Belongings given to patient    

## 2022-08-16 NOTE — TOC Transition Note (Signed)
Transition of Care [facility]) - CM/SW Discharge Note   Patient Details  Name: Patricia [name] MRN: [id number] Date of Birth: [birth year]  [facility] of [facility]) CM/SW Contact:  Lavenia Atlas, RN Phone Number: 08/16/2022, 10:59 AM   Clinical Narrative:   Per chart review patient HHPT/OT orders are in and patient is set up with [facility] This RNCM notified Lannette Donath w/[facility] of patient's discharge and added to AVS.   No additional TOC needs.    Final next level of care: Home w Home Health Services Barriers to Discharge: No Barriers Identified   Patient Goals and CMS Choice CMS Medicare.gov Compare Post Acute Care list provided to:: Patient Choice offered to / list presented to : Patient  Discharge Placement                         Discharge Plan and Services Additional resources added to the After Visit Summary for   In-house Referral: Clinical Social Work              DME Arranged: N/A DME Agency: NA       HH Arranged: OT, PT HH Agency: Other - See comment ([facility]) Date HH Agency Contacted: 08/16/22 Time HH Agency Contacted: 1059 Representative spoke with at [facility] Agency: Conservation officer, historic buildings  Social Determinants of Health (SDOH) Interventions SDOH Screenings   Food Insecurity: No Food Insecurity (08/13/2022)  Housing: Low Risk  (08/13/2022)  Transportation Needs: No Transportation Needs (08/13/2022)  Utilities: Not At Risk (08/13/2022)  Alcohol Screen: Low Risk  (10/09/2021)  Depression (PHQ2-9): Low Risk  (04/23/2022)  Financial Resource Strain: Low Risk  (10/09/2021)  Physical Activity: Insufficiently Active (10/09/2021)  Social Connections: Socially Integrated (10/09/2021)  Stress: No Stress Concern Present (10/09/2021)  Tobacco Use: Medium Risk (08/14/2022)     Readmission Risk Interventions     No data to display

## 2022-08-17 ENCOUNTER — Inpatient Hospital Stay: Payer: Medicare Other

## 2022-08-17 ENCOUNTER — Inpatient Hospital Stay: Payer: Medicare Other | Admitting: Medical Oncology

## 2022-08-17 ENCOUNTER — Telehealth: Payer: Self-pay | Admitting: Cardiology

## 2022-08-17 DIAGNOSIS — I5033 Acute on chronic diastolic (congestive) heart failure: Secondary | ICD-10-CM | POA: Diagnosis not present

## 2022-08-17 DIAGNOSIS — I4819 Other persistent atrial fibrillation: Secondary | ICD-10-CM | POA: Diagnosis not present

## 2022-08-17 DIAGNOSIS — F0280 Dementia in other diseases classified elsewhere without behavioral disturbance: Secondary | ICD-10-CM | POA: Diagnosis not present

## 2022-08-17 DIAGNOSIS — Z7901 Long term (current) use of anticoagulants: Secondary | ICD-10-CM | POA: Diagnosis not present

## 2022-08-17 DIAGNOSIS — I272 Pulmonary hypertension, unspecified: Secondary | ICD-10-CM | POA: Diagnosis not present

## 2022-08-17 DIAGNOSIS — Z85118 Personal history of other malignant neoplasm of bronchus and lung: Secondary | ICD-10-CM | POA: Diagnosis not present

## 2022-08-17 DIAGNOSIS — Z87891 Personal history of nicotine dependence: Secondary | ICD-10-CM | POA: Diagnosis not present

## 2022-08-17 DIAGNOSIS — Z8673 Personal history of transient ischemic attack (TIA), and cerebral infarction without residual deficits: Secondary | ICD-10-CM | POA: Diagnosis not present

## 2022-08-17 DIAGNOSIS — Z7951 Long term (current) use of inhaled steroids: Secondary | ICD-10-CM | POA: Diagnosis not present

## 2022-08-17 DIAGNOSIS — C50412 Malignant neoplasm of upper-outer quadrant of left female breast: Secondary | ICD-10-CM | POA: Diagnosis not present

## 2022-08-17 DIAGNOSIS — G40909 Epilepsy, unspecified, not intractable, without status epilepticus: Secondary | ICD-10-CM | POA: Diagnosis not present

## 2022-08-17 DIAGNOSIS — M1990 Unspecified osteoarthritis, unspecified site: Secondary | ICD-10-CM | POA: Diagnosis not present

## 2022-08-17 DIAGNOSIS — E785 Hyperlipidemia, unspecified: Secondary | ICD-10-CM | POA: Diagnosis not present

## 2022-08-17 DIAGNOSIS — I080 Rheumatic disorders of both mitral and aortic valves: Secondary | ICD-10-CM | POA: Diagnosis not present

## 2022-08-17 DIAGNOSIS — I130 Hypertensive heart and chronic kidney disease with heart failure and stage 1 through stage 4 chronic kidney disease, or unspecified chronic kidney disease: Secondary | ICD-10-CM | POA: Diagnosis not present

## 2022-08-17 DIAGNOSIS — Z556 Problems related to health literacy: Secondary | ICD-10-CM | POA: Diagnosis not present

## 2022-08-17 DIAGNOSIS — M8580 Other specified disorders of bone density and structure, unspecified site: Secondary | ICD-10-CM | POA: Diagnosis not present

## 2022-08-17 DIAGNOSIS — Z604 Social exclusion and rejection: Secondary | ICD-10-CM | POA: Diagnosis not present

## 2022-08-17 DIAGNOSIS — J189 Pneumonia, unspecified organism: Secondary | ICD-10-CM | POA: Diagnosis not present

## 2022-08-17 DIAGNOSIS — N183 Chronic kidney disease, stage 3 unspecified: Secondary | ICD-10-CM | POA: Diagnosis not present

## 2022-08-17 DIAGNOSIS — J45991 Cough variant asthma: Secondary | ICD-10-CM | POA: Diagnosis not present

## 2022-08-17 DIAGNOSIS — E039 Hypothyroidism, unspecified: Secondary | ICD-10-CM | POA: Diagnosis not present

## 2022-08-17 DIAGNOSIS — Z9181 History of falling: Secondary | ICD-10-CM | POA: Diagnosis not present

## 2022-08-17 DIAGNOSIS — S72002D Fracture of unspecified part of neck of left femur, subsequent encounter for closed fracture with routine healing: Secondary | ICD-10-CM | POA: Diagnosis not present

## 2022-08-17 NOTE — Telephone Encounter (Signed)
Patient contacted regarding discharge from [facility] on 08/16/22.  Patient understands to follow up with provider Jari Favre, PA on 08/24/22 at 2:45 at [facility] [address]. Patient understands discharge instructions? Yes Patient understands medications and regiment? Yes Patient understands to bring all medications to this visit? Yes  Ask patient:  Are you enrolled in My Chart Yes

## 2022-08-17 NOTE — Telephone Encounter (Signed)
TOC per Dr. Eden Emms scheduled for 08/24/22 at 2:45pm with Jari Favre, PA

## 2022-08-18 ENCOUNTER — Encounter: Payer: Self-pay | Admitting: Hematology & Oncology

## 2022-08-18 ENCOUNTER — Encounter: Payer: Self-pay | Admitting: Cardiology

## 2022-08-19 ENCOUNTER — Telehealth: Payer: Self-pay

## 2022-08-19 NOTE — Transitions of Care (Post Inpatient/ED Visit) (Signed)
08/19/2022  Name: Patricia [name] MRN: [id number] DOB: 1940-08-15  Today's TOC FU Call Status: Today's TOC FU Call Status:: Successful TOC FU Call Competed TOC FU Call Complete Date: 08/19/22  Transition Care Management Follow-up Telephone Call Discharge Facility: [facility]) Type of Discharge: Inpatient Admission Primary Inpatient Discharge Diagnosis:: Atrial Fibrillation How have you been since you were released from the hospital?: Better Any questions or concerns?: No  Items Reviewed: Did you receive and understand the discharge instructions provided?: Yes Medications obtained,verified, and reconciled?: Yes (Medications Reviewed) Any new allergies since your discharge?: No Dietary orders reviewed?: Yes Type of Diet Ordered:: Low sodium, Heart Healthy-1500cc fluid Do you have support at home?: Yes People in Home: spouse Name of Support/Comfort Primary Source: Leonette Most  Medications Reviewed Today: Medications Reviewed Today     Reviewed by Jodelle Gross, RN (Case Manager) on 08/19/22 at 1455  Med List Status: <None>   Medication Order Taking? Sig Documenting Provider Last Dose Status Informant  acetaminophen (TYLENOL) tablet 650 mg [id number]   Josph Macho, MD  Active   albuterol (PROVENTIL HFA;VENTOLIN HFA) 108 (90 Base) MCG/ACT inhaler [id number] Yes Inhale 2 puffs into the lungs every 6 (six) hours as needed for wheezing or shortness of breath. [provider] Taking Active Self, Pharmacy Records  apixaban (ELIQUIS) 5 MG TABS tablet [id number] Yes Take 1 tablet (5 mg total) by mouth 2 (two) times daily. Quintella Reichert, MD Taking Active Self, Pharmacy Records  Apoaequorin [facility]) 10 MG CAPS [id number] Yes Take 1 capsule by mouth every morning. Carlean Jews, NP Taking Active Self, Pharmacy Records  atorvastatin (LIPITOR) 40 MG tablet [id number] Yes Take 1 tablet (40 mg total) by mouth daily before supper. Quintella Reichert, MD Taking Active Self, Pharmacy  Records  bimatoprost (LUMIGAN) 0.01 % SOLN [id number] Yes Place 1 drop into both eyes 2 (two) times daily. [provider] Taking Active Self, Pharmacy Records  calcium-vitamin D (OSCAL WITH D) 500-200 MG-UNIT tablet [id number] No Take 1 tablet by mouth daily with breakfast. Thomasene Lot, DO Unknown Active Self, Pharmacy Records           Med Note Sherrie Mustache, [place] A   Thu Jul 23, 2022  9:41 AM) On hold for procedure   dexamethasone (DECADRON) 10 mg in sodium chloride 0.9 % 50 mL IVPB [id number]   Josph Macho, MD  Active   dexamethasone (DECADRON) 4 MG tablet [id number] No Take 2 tablets (8 mg) by mouth daily for 3 days starting the day after chemotherapy. Take with food. Josph Macho, MD Unknown Active Self, Pharmacy Records  dextrose 5 % solution [id number]   Josph Macho, MD  Active   digoxin (LANOXIN) 0.125 MG tablet [id number] Yes Take 1 tablet (0.125 mg total) by mouth daily. Glade Lloyd, MD Taking Active   diphenhydrAMINE (BENADRYL) capsule 50 mg [id number]   Josph Macho, MD  Active   dorzolamide-timolol (COSOPT) 22.3-6.8 MG/ML ophthalmic solution [id number] Yes Place 1 drop into both eyes at bedtime. [provider] Taking Active Self, Pharmacy Records  fluticasone [facility]) 50 MCG/ACT nasal spray [id number] Yes 1 spray each nostril following sinus rinses twice daily Thomasene Lot, DO Taking Active Self, Pharmacy Records  fluticasone-salmeterol (ADVAIR [facility]) [phone] MCG/ACT inhaler [id number] Yes USE 2 INHALATIONS TWICE A DAY [name], PA Taking Active Self, Pharmacy Records  fosaprepitant (EMEND) 150 mg in sodium chloride 0.9 % 145 mL IVPB [id number]   Josph Macho, MD  Active   furosemide (  LASIX) 40 MG tablet [id number] Yes Take 0.5 tablets (20 mg total) by mouth daily as needed for edema or fluid (weight gain of >3 lbs or worsening dyspnea). Glade Lloyd, MD Taking Active   gabapentin (NEURONTIN) 400 MG capsule [id number] Yes TAKE 1 CAPSULE TWICE A  DAY Abonza, Maritza, PA-C Taking Active Self, Pharmacy Records  L-Methylfolate-B12-B6-B2 (CEREFOLIN) 07-31-48-5 MG TABS [id number] No Take 1 tablet by mouth every morning. Ihor Austin, NP Unknown Active Self, Pharmacy Records           Med Note Sherrie Mustache, [place] A   Thu Jul 23, 2022  9:45 AM) On hold for procedure   levETIRAcetam (KEPPRA XR) 500 MG 24 hr tablet [id number] Yes Take 1 tablet (500 mg total) by mouth at bedtime. Ihor Austin, NP Taking Active Self, Pharmacy Records  loratadine (CLARITIN) 10 MG tablet [id number] Yes Take 10 mg by mouth daily. [provider] Taking Active Self, Pharmacy Records  memantine [facility]) 10 MG tablet [id number] Yes Take 1 tablet (10 mg total) by mouth 2 (two) times daily. Ihor Austin, NP Taking Active Self, Pharmacy Records  metoprolol tartrate (LOPRESSOR) 50 MG tablet [id number] Yes Take 1 tablet (50 mg total) by mouth 2 (two) times daily. Glade Lloyd, MD Taking Active   Multiple Vitamins-Iron (MULTIVITAMIN/IRON) TABS [id number]  Take 1 tablet by mouth daily. [provider]  Active Self, Pharmacy Records  Netarsudil Dimesylate (RHOPRESSA) 0.02 % SOLN [id number]  Place 1 drop into both eyes at bedtime. [provider]  Active Self, Pharmacy Records  ondansetron (ZOFRAN) 8 MG tablet [id number]  Take 1 tablet (8 mg total) by mouth every 8 (eight) hours as needed for nausea or vomiting. Start on the third day after chemotherapy. Josph Macho, MD  Active Self, Pharmacy Records  OVER THE COUNTER MEDICATION [id number]  Take 1 tablet by mouth daily. Zyflamend otc herbal supplement [provider]  Active Self, Pharmacy Records  palonosetron (ALOXI) injection 0.25 mg [id number]   Josph Macho, MD  Active   pantoprazole (PROTONIX) 40 MG tablet [id number]  TAKE 1 TABLET DAILY Boscia, Kathlynn Grate, NP  Active Self, Pharmacy Records  potassium chloride SA (KLOR-CON M) 20 MEQ tablet [id number]  TAKE 1 TABLET TWICE A DAY Turner, Cornelious Bryant, MD   Active Self, Pharmacy Records  prochlorperazine (COMPAZINE) 10 MG tablet [id number]  Take 1 tablet (10 mg total) by mouth every 6 (six) hours as needed for nausea or vomiting. Josph Macho, MD  Active Self, Pharmacy Records  thyroid [facility] THYROID) 15 MG tablet [id number]  Take 1 tablet (15 mg total) by mouth daily. Take with 60mg  for a total dose of 75mg  daily. Glade Lloyd, MD  Active   thyroid [facility] THYROID) 60 MG tablet [id number]  Take 1 tablet (60 mg total) by mouth daily. Takes with 15mg  daily. Glade Lloyd, MD  Active   traMADol (ULTRAM) 50 MG tablet [id number]  Take 1 tablet (50 mg total) by mouth every 6 (six) hours as needed (post op pain). Elodia Florence, PA-C  Active Self, Pharmacy Records  Vitamin D, Ergocalciferol, (DRISDOL) 1.25 MG (50000 UNIT) CAPS capsule [id number]  TAKE 1 CAPSULE WEEKLY Carlean Jews, NP  Active Self, Pharmacy Records           Med Note Joella Prince A   Thu Jul 23, 2022  9:44 AM) Leanora Ivanoff on Sundays. On hold for procedure             Home Care and Equipment/Supplies: Were Home Health Services  Ordered?: Yes Name of Home Health Agency:: [facility] Has Agency set up a time to come to your home?: Yes First Home Health Visit Date: 08/18/22 Any new equipment or medical supplies ordered?: No  Functional Questionnaire: Do you need assistance with bathing/showering or dressing?: Yes Do you need assistance with meal preparation?: Yes Do you need assistance with eating?: No Do you have difficulty maintaining continence: Yes Do you need assistance with getting out of bed/getting out of a chair/moving?: Yes Do you have difficulty managing or taking your medications?: Yes  Follow up appointments reviewed: PCP Follow-up appointment confirmed?: Yes Date of PCP follow-up appointment?: 08/24/22 Follow-up Provider: Saralyn Pilar, PA Specialist Hospital Follow-up appointment confirmed?: Yes Date of Specialist follow-up appointment?:  08/24/22 Follow-Up Specialty Provider:: Jari Favre, Cardiology Do you need transportation to your follow-up appointment?: No Do you understand care options if your condition(s) worsen?: Yes-patient verbalized understanding  SDOH Interventions Today    Flowsheet Row Most Recent Value  SDOH Interventions   Food Insecurity Interventions Intervention Not Indicated  Housing Interventions Intervention Not Indicated  Transportation Interventions Intervention Not Indicated     Jodelle Gross, RN, BSN, CCM Care Management Coordinator [facility] Phone: [phone]/Fax: [phone]

## 2022-08-20 DIAGNOSIS — C50412 Malignant neoplasm of upper-outer quadrant of left female breast: Secondary | ICD-10-CM | POA: Diagnosis not present

## 2022-08-20 DIAGNOSIS — N183 Chronic kidney disease, stage 3 unspecified: Secondary | ICD-10-CM | POA: Diagnosis not present

## 2022-08-20 DIAGNOSIS — I272 Pulmonary hypertension, unspecified: Secondary | ICD-10-CM | POA: Diagnosis not present

## 2022-08-20 DIAGNOSIS — I130 Hypertensive heart and chronic kidney disease with heart failure and stage 1 through stage 4 chronic kidney disease, or unspecified chronic kidney disease: Secondary | ICD-10-CM | POA: Diagnosis not present

## 2022-08-20 DIAGNOSIS — I5033 Acute on chronic diastolic (congestive) heart failure: Secondary | ICD-10-CM | POA: Diagnosis not present

## 2022-08-20 DIAGNOSIS — I4819 Other persistent atrial fibrillation: Secondary | ICD-10-CM | POA: Diagnosis not present

## 2022-08-24 ENCOUNTER — Ambulatory Visit: Payer: Medicare Other | Attending: Physician Assistant | Admitting: Physician Assistant

## 2022-08-24 ENCOUNTER — Encounter: Payer: Self-pay | Admitting: Physician Assistant

## 2022-08-24 ENCOUNTER — Ambulatory Visit (INDEPENDENT_AMBULATORY_CARE_PROVIDER_SITE_OTHER): Payer: Medicare Other | Admitting: Family Medicine

## 2022-08-24 ENCOUNTER — Encounter: Payer: Self-pay | Admitting: Family Medicine

## 2022-08-24 VITALS — BP 127/84 | HR 100 | Resp 18 | Ht 60.0 in | Wt 146.0 lb

## 2022-08-24 VITALS — BP 108/80 | HR 75 | Ht 60.0 in | Wt 146.0 lb

## 2022-08-24 DIAGNOSIS — I272 Pulmonary hypertension, unspecified: Secondary | ICD-10-CM | POA: Diagnosis not present

## 2022-08-24 DIAGNOSIS — G4733 Obstructive sleep apnea (adult) (pediatric): Secondary | ICD-10-CM | POA: Diagnosis not present

## 2022-08-24 DIAGNOSIS — I10 Essential (primary) hypertension: Secondary | ICD-10-CM | POA: Diagnosis not present

## 2022-08-24 DIAGNOSIS — Z09 Encounter for follow-up examination after completed treatment for conditions other than malignant neoplasm: Secondary | ICD-10-CM

## 2022-08-24 DIAGNOSIS — I5032 Chronic diastolic (congestive) heart failure: Secondary | ICD-10-CM

## 2022-08-24 DIAGNOSIS — I4821 Permanent atrial fibrillation: Secondary | ICD-10-CM

## 2022-08-24 DIAGNOSIS — I4819 Other persistent atrial fibrillation: Secondary | ICD-10-CM | POA: Insufficient documentation

## 2022-08-24 DIAGNOSIS — I4891 Unspecified atrial fibrillation: Secondary | ICD-10-CM | POA: Diagnosis not present

## 2022-08-24 DIAGNOSIS — R0602 Shortness of breath: Secondary | ICD-10-CM | POA: Diagnosis not present

## 2022-08-24 MED ORDER — FUROSEMIDE 20 MG PO TABS: 20.0000 mg | ORAL_TABLET | Freq: Two times a day (BID) | ORAL | 2 refills | Status: DC

## 2022-08-24 NOTE — Patient Instructions (Addendum)
Medication Instructions:   START TAKING:  LASIX 20 MG TWICE  A DAY AND TAKE  ADDITIONAL 20 MG AS NEEDED FOR LOWER EXTREMITY EDEMA    *If you need a refill on your cardiac medications before your next appointment, please call your pharmacy*   Lab Work: NONE ORDERED  TODAY    If you have labs (blood work) drawn today and your tests are completely normal, you will receive your results only by: MyChart Message (if you have MyChart) OR A paper copy in the mail If you have any lab test that is abnormal or we need to change your treatment, we will call you to review the results.   Testing/Procedures: NONE ORDERED  TODAY    Follow-Up: At [facility], you and your health needs are our priority.  As part of our continuing mission to provide you with exceptional heart care, we have created designated Provider Care Teams.  These Care Teams include your primary Cardiologist (physician) and Advanced Practice Providers (APPs -  Physician Assistants and Nurse Practitioners) who all work together to provide you with the care you need, when you need it.  We recommend signing up for the patient portal called "MyChart".  Sign up information is provided on this After Visit Summary.  MyChart is used to connect with patients for Virtual Visits (Telemedicine).  Patients are able to view lab/test results, encounter notes, upcoming appointments, etc.  Non-urgent messages can be sent to your provider as well.   To learn more about what you can do with MyChart, go to [email or web address].    Your next appointment:   3-4  month(s)  Provider:   Armanda Magic, MD     Other Instructions Low-Sodium Eating Plan Salt (sodium) helps you keep a healthy balance of fluids in your body. Too much sodium can raise your blood pressure. It can also cause fluid and waste to be held in your body. Your health care provider or dietitian may recommend a low-sodium eating plan if you have high blood pressure  (hypertension), kidney disease, liver disease, or heart failure. Eating less sodium can help lower your blood pressure and reduce swelling. It can also protect your heart, liver, and kidneys. What are tips for following this plan? Reading food labels  Check food labels for the amount of sodium per serving. If you eat more than one serving, you must multiply the listed amount by the number of servings. Choose foods with less than 140 milligrams (mg) of sodium per serving. Avoid foods with 300 mg of sodium or more per serving. Always check how much sodium is in a product, even if the label says "unsalted" or "no salt added." Shopping  Buy products labeled as "low-sodium" or "no salt added." Buy fresh foods. Avoid canned foods and pre-made or frozen meals. Avoid canned, cured, or processed meats. Buy breads that have less than 80 mg of sodium per slice. Cooking  Eat more home-cooked food. Try to eat less restaurant, buffet, and fast food. Try not to add salt when you cook. Use salt-free seasonings or herbs instead of table salt or sea salt. Check with your provider or pharmacist before using salt substitutes. Cook with plant-based oils, such as canola, sunflower, or olive oil. Meal planning When eating at a restaurant, ask if your food can be made with less salt or no salt. Avoid dishes labeled as brined, pickled, cured, or smoked. Avoid dishes made with soy sauce, miso, or teriyaki sauce. Avoid foods that have monosodium glutamate (  MSG) in them. MSG may be added to some restaurant food, sauces, soups, bouillon, and canned foods. Make meals that can be grilled, baked, poached, roasted, or steamed. These are often made with less sodium. General information Try to limit your sodium intake to 1,500-2,300 mg each day, or the amount told by your provider. What foods should I eat? Fruits Fresh, frozen, or canned fruit. Fruit juice. Vegetables Fresh or frozen vegetables. "No salt added" canned  vegetables. "No salt added" tomato sauce and paste. Low-sodium or reduced-sodium tomato and vegetable juice. Grains Low-sodium cereals, such as oats, puffed wheat and rice, and shredded wheat. Low-sodium crackers. Unsalted rice. Unsalted pasta. Low-sodium bread. Whole grain breads and whole grain pasta. Meats and other proteins Fresh or frozen meat, poultry, seafood, and fish. These should have no added salt. Low-sodium canned tuna and salmon. Unsalted nuts. Dried peas, beans, and lentils without added salt. Unsalted canned beans. Eggs. Unsalted nut butters. Dairy Milk. Soy milk. Cheese that is naturally low in sodium, such as ricotta cheese, fresh mozzarella, or Swiss cheese. Low-sodium or reduced-sodium cheese. Cream cheese. Yogurt. Seasonings and condiments Fresh and dried herbs and spices. Salt-free seasonings. Low-sodium mustard and ketchup. Sodium-free salad dressing. Sodium-free light mayonnaise. Fresh or refrigerated horseradish. Lemon juice. Vinegar. Other foods Homemade, reduced-sodium, or low-sodium soups. Unsalted popcorn and pretzels. Low-salt or salt-free chips. The items listed above may not be all the foods and drinks you can have. Talk to a dietitian to learn more. What foods should I avoid? Vegetables Sauerkraut, pickled vegetables, and relishes. Olives. [place] fries. Onion rings. Regular canned vegetables, except low-sodium or reduced-sodium items. Regular canned tomato sauce and paste. Regular tomato and vegetable juice. Frozen vegetables in sauces. Grains Instant hot cereals. Bread stuffing, pancake, and biscuit mixes. Croutons. Seasoned rice or pasta mixes. Noodle soup cups. Boxed or frozen macaroni and cheese. Regular salted crackers. Self-rising flour. Meats and other proteins Meat or fish that is salted, canned, smoked, spiced, or pickled. Precooked or cured meat, such as sausages or meat loaves. Tomasa Blase. Ham. Pepperoni. Hot dogs. Corned beef. Chipped beef. Salt pork. Jerky.  Pickled herring, anchovies, and sardines. Regular canned tuna. Salted nuts. Dairy Processed cheese and cheese spreads. Hard cheeses. Cheese curds. Blue cheese. Feta cheese. String cheese. Regular cottage cheese. Buttermilk. Canned milk. Fats and oils Salted butter. Regular margarine. Ghee. Bacon fat. Seasonings and condiments Onion salt, garlic salt, seasoned salt, table salt, and sea salt. Canned and packaged gravies. Worcestershire sauce. Tartar sauce. Barbecue sauce. Teriyaki sauce. Soy sauce, including reduced-sodium soy sauce. Steak sauce. Fish sauce. Oyster sauce. Cocktail sauce. Horseradish that you find on the shelf. Regular ketchup and mustard. Meat flavorings and tenderizers. Bouillon cubes. Hot sauce. Pre-made or packaged marinades. Pre-made or packaged taco seasonings. Relishes. Regular salad dressings. Salsa. Other foods Salted popcorn and pretzels. Corn chips and puffs. Potato and tortilla chips. Canned or dried soups. Pizza. Frozen entrees and pot pies. The items listed above may not be all the foods and drinks you should avoid. Talk to a dietitian to learn more. This information is not intended to replace advice given to you by your health care provider. Make sure you discuss any questions you have with your health care provider. Document Revised: 03/05/2022 Document Reviewed: 03/05/2022 Elsevier Patient Education  2024 [employer]   Heart-Healthy Eating Plan Eating a healthy diet is important for the health of your heart. A heart-healthy eating plan includes: Eating less unhealthy fats. Eating more healthy fats. Eating less salt in your food. Salt  is also called sodium. Making other changes in your diet. Talk with your doctor or a diet specialist (dietitian) to create an eating plan that is right for you. What is my plan? Your doctor may recommend an eating plan that includes: Total fat: ______% or less of total calories a day. Saturated fat: ______% or less of total  calories a day. Cholesterol: less than _________mg a day. Sodium: less than _________mg a day. What are tips for following this plan? Cooking Avoid frying your food. Try to bake, boil, grill, or broil it instead. You can also reduce fat by: Removing the skin from poultry. Removing all visible fats from meats. Steaming vegetables in water or broth. Meal planning  At meals, divide your plate into four equal parts: Fill one-half of your plate with vegetables and green salads. Fill one-fourth of your plate with whole grains. Fill one-fourth of your plate with lean protein foods. Eat 2-4 cups of vegetables per day. One cup of vegetables is: 1 cup (91 g) broccoli or cauliflower florets. 2 medium carrots. 1 large bell pepper. 1 large sweet potato. 1 large tomato. 1 medium white potato. 2 cups (150 g) raw leafy greens. Eat 1-2 cups of fruit per day. One cup of fruit is: 1 small apple 1 large banana 1 cup (237 g) mixed fruit, 1 large orange,  cup (82 g) dried fruit, 1 cup (240 mL) 100% fruit juice. Eat more foods that have soluble fiber. These are apples, broccoli, carrots, beans, peas, and barley. Try to get 20-30 g of fiber per day. Eat 4-5 servings of nuts, legumes, and seeds per week: 1 serving of dried beans or legumes equals  cup (90 g) cooked. 1 serving of nuts is  oz (12 almonds, 24 pistachios, or 7 walnut halves). 1 serving of seeds equals  oz (8 g). General information Eat more home-cooked food. Eat less restaurant, buffet, and fast food. Limit or avoid alcohol. Limit foods that are high in starch and sugar. Avoid fried foods. Lose weight if you are overweight. Keep track of how much salt (sodium) you eat. This is important if you have high blood pressure. Ask your doctor to tell you more about this. Try to add vegetarian meals each week. Fats Choose healthy fats. These include olive oil and canola oil, flaxseeds, walnuts, almonds, and seeds. Eat more omega-3  fats. These include salmon, mackerel, sardines, tuna, flaxseed oil, and ground flaxseeds. Try to eat fish at least 2 times each week. Check food labels. Avoid foods with trans fats or high amounts of saturated fat. Limit saturated fats. These are often found in animal products, such as meats, butter, and cream. These are also found in plant foods, such as palm oil, palm kernel oil, and coconut oil. Avoid foods with partially hydrogenated oils in them. These have trans fats. Examples are stick margarine, some tub margarines, cookies, crackers, and other baked goods. What foods should I eat? Fruits All fresh, canned (in natural juice), or frozen fruits. Vegetables Fresh or frozen vegetables (raw, steamed, roasted, or grilled). Green salads. Grains Most grains. Choose whole wheat and whole grains most of the time. Rice and pasta, including brown rice and pastas made with whole wheat. Meats and other proteins Lean, well-trimmed beef, veal, pork, and lamb. Chicken and [place] without skin. All fish and shellfish. Wild duck, rabbit, pheasant, and venison. Egg whites or low-cholesterol egg substitutes. Dried beans, peas, lentils, and tofu. Seeds and most nuts. Dairy Low-fat or nonfat cheeses, including ricotta and  mozzarella. Skim or 1% milk that is liquid, powdered, or evaporated. Buttermilk that is made with low-fat milk. Nonfat or low-fat yogurt. Fats and oils Non-hydrogenated (trans-free) margarines. Vegetable oils, including soybean, sesame, sunflower, olive, peanut, safflower, corn, canola, and cottonseed. Salad dressings or mayonnaise made with a vegetable oil. Beverages Mineral water. Coffee and tea. Diet carbonated beverages. Sweets and desserts Sherbet, gelatin, and fruit ice. Small amounts of dark chocolate. Limit all sweets and desserts. Seasonings and condiments All seasonings and condiments. The items listed above may not be a complete list of foods and drinks you can eat. Contact a  dietitian for more options. What foods should I avoid? Fruits Canned fruit in heavy syrup. Fruit in cream or butter sauce. Fried fruit. Limit coconut. Vegetables Vegetables cooked in cheese, cream, or butter sauce. Fried vegetables. Grains Breads that are made with saturated or trans fats, oils, or whole milk. Croissants. Sweet rolls. Donuts. High-fat crackers, such as cheese crackers. Meats and other proteins Fatty meats, such as hot dogs, ribs, sausage, bacon, rib-eye roast or steak. High-fat deli meats, such as salami and bologna. Caviar. Domestic duck and goose. Organ meats, such as liver. Dairy Cream, sour cream, cream cheese, and creamed cottage cheese. Whole-milk cheeses. Whole or 2% milk that is liquid, evaporated, or condensed. Whole buttermilk. Cream sauce or high-fat cheese sauce. Yogurt that is made from whole milk. Fats and oils Meat fat, or shortening. Cocoa butter, hydrogenated oils, palm oil, coconut oil, palm kernel oil. Solid fats and shortenings, including bacon fat, salt pork, lard, and butter. Nondairy cream substitutes. Salad dressings with cheese or sour cream. Beverages Regular sodas and juice drinks with added sugar. Sweets and desserts Frosting. Pudding. Cookies. Cakes. Pies. Milk chocolate or white chocolate. Buttered syrups. Full-fat ice cream or ice cream drinks. The items listed above may not be a complete list of foods and drinks to avoid. Contact a dietitian for more information. Summary Heart-healthy meal planning includes eating less unhealthy fats, eating more healthy fats, and making other changes in your diet. Eat a balanced diet. This includes fruits and vegetables, low-fat or nonfat dairy, lean protein, nuts and legumes, whole grains, and heart-healthy oils and fats. This information is not intended to replace advice given to you by your health care provider. Make sure you discuss any questions you have with your health care provider. Document Revised:  03/24/2021 Document Reviewed: 03/24/2021 Elsevier Patient Education  2024 [employer].

## 2022-08-24 NOTE — Progress Notes (Signed)
Cardiology Office Note:  .   Date:  08/24/2022  ID:  Jkayla Spiewak, DOB [birth year], MRN [id number] PCP: [name], PA  Patricia [name] Cardiologist:  Armanda Magic, MD Electrophysiologist:  Regan Lemming, MD {  History of Present Illness: Patricia [name]   Patricia [name] is a 82 y.o. female with a history of chronic atrial fibrillation on Eliquis (amiodarone stopped because of prolonged QT), CVA, chronic diastolic CHF, mild AI, normal NST 2017, lung CA resection 2003, breast CA status post lumpectomy/tamoxifen/XRT, HTN, HLD who presents today for follow-up appointment.  2D echocardiogram in 2018 showed moderate PHTN with PASP 52 mmHg and RHC 04/2016 with borderline pulmonary hypertension.  Repeat 2D echocardiogram 12/2019 showed normal LVEF with mild-moderate MR and normal PASP.  Followed in A-fib clinic and now has permanent atrial fibrillation.  Underwent sleep study in November 2021 was found to have mild OSA with an AHI of 9.6/h with no central apneas underwent CPAP titration but cannot be adequately treated due to ongoing events and underwent BiPAP titration and was ultimately placed on BiPAP.  Most recent 2D echocardiogram done follow-up of mitral regurgitation which showed normal LVEF and systolic function, mild RV enlargement and moderate right atrial enlargement as well as moderate to severe MR and TR which are new.  Underwent TEE on 01/07/2021 showing low normal LV function with EF 50 to 55% with severe left atrial enlargement, severe right atrial enlargement and moderate to severe mitral regurgitation with my exam he is tricuspid valve and moderate TR.  Refer to structural heart clinic and was seen by Patricia [name] who felt she had moderate to severe 3 regurgitation due to combination of atrial functional MR with marked left atrial enlargement as well as degenerative mitral valve with severe calcification of the P2 scallop of the posterior leaflet.  It was felt that she was not a  candidate for transcatheter therapies because of severe posterior leaflet calcification.  Ongoing medical therapy was recommended at that time given lack of significant symptoms.  She has been continued on annual echocardiogram reassessments.  Last seen by Patricia [name] January 2024.  Was doing well with her PAP device at that time.  Tolerating mask and felt the pressure is adequate.  No other symptoms at that time.  Today, she has been fighting bone cancer.  She was at [facility] and felt.  Found to be in atrial fibrillation which is chronic.  Rates were uncontrolled at that time.  She was found to have fluid on her lungs as well.  She was given IV metoprolol as well as Cardizem.  Discharged home on p.o. digoxin and metoprolol. Her biggest issue has been getting through the physical therapy exercises.  Reports no shortness of breath nor dyspnea on exertion. Reports no chest pain, pressure, or tightness. No edema, orthopnea, PND. Reports no palpitations.   ROS: Pertinent ROS is in HPI  Studies Reviewed: .       07/21/22 Echo  IMPRESSIONS     1. Left ventricular ejection fraction, by estimation, is 60 to 65%. The  left ventricle has normal function. The left ventricle has no regional  wall motion abnormalities. There is mild left ventricular hypertrophy.  Left ventricular diastolic parameters  were normal.   2. Right ventricular systolic function is normal. The right ventricular  size is mildly enlarged.   3. Left atrial size was severely dilated.   4. Right atrial size was severely dilated.   5. The mitral valve  is normal in structure. Mild to moderate mitral valve  regurgitation. No evidence of mitral stenosis.   6. Tricuspid valve regurgitation is moderate to severe.   7. The aortic valve is tricuspid. Aortic valve regurgitation is mild to  moderate. No aortic stenosis is present. Aortic regurgitation PHT measures  389 msec.   8. The inferior vena cava is normal in size  with greater than 50%  respiratory variability, suggesting right atrial pressure of 3 mmHg.   2D echo 12/2019 IMPRESSIONS    1. Left ventricular ejection fraction, by estimation, is 55 to 60%. The  left ventricle has normal function. The left ventricle has no regional  wall motion abnormalities. There is mild left ventricular hypertrophy.  Left ventricular diastolic parameters  are indeterminate.   2. Right ventricular systolic function is mildly reduced. The right  ventricular size is normal. There is mildly elevated pulmonary artery  systolic pressure.   3. Left atrial size was severely dilated.   4. Right atrial size was severely dilated.   5. The mitral valve is normal in structure. Mild to moderate mitral valve  regurgitation. ERO 0.19 cm^2, RV 23 cc. Moderate mitral annular  calcification.   6. Tricuspid valve regurgitation is moderate.   7. The aortic valve is tricuspid. Aortic valve regurgitation is mild.  Mild to moderate aortic valve sclerosis/calcification is present, without  any evidence of aortic stenosis.    2D echo 11/2020 IMPRESSIONS    1. Left ventricular ejection fraction, by estimation, is 60 to 65%. The  left ventricle has normal function. The left ventricle has no regional  wall motion abnormalities. Left ventricular diastolic function could not  be evaluated. There is the  interventricular septum is flattened in systole and diastole, consistent  with right ventricular pressure and volume overload.   2. Right ventricular systolic function is mildly reduced. The right  ventricular size is mildly enlarged. There is moderately elevated  pulmonary artery systolic pressure. The estimated right ventricular  systolic pressure is 46.9 mmHg.   3. Left atrial size was severely dilated.   4. Right atrial size was severely dilated.   5. The mitral valve is degenerative. Moderate to severe mitral valve  regurgitation. No evidence of mitral stenosis.   6. The  tricuspid valve is abnormal. Tricuspid valve regurgitation is  moderate to severe.   7. The aortic valve is tricuspid. Aortic valve regurgitation is mild. No  aortic stenosis is present. Aortic regurgitation PHT measures 884 msec.   8. The inferior vena cava is normal in size with greater than 50%  respiratory variability, suggesting right atrial pressure of 3 mmHg.   Comparison(s): Changes from prior study are noted. MR/TR appear to be  moderate to severe on this study. LV function unchanged. AI remains mild.    Risk Assessment/Calculations:    CHA2DS2-VASc Score = 7   This indicates a 11.2% annual risk of stroke. The patient's score is based upon: CHF History: 1 HTN History: 1 Diabetes History: 0 Stroke History: 2 Vascular Disease History: 0 Age Score: 2 Gender Score: 1           Physical Exam:   VS:  BP 108/80   Pulse 75   Ht 5' (1.524 m)   Wt 146 lb (66.2 kg)   SpO2 95%   BMI 28.51 kg/m    Wt Readings from Last 3 Encounters:  08/24/22 146 lb (66.2 kg)  08/24/22 146 lb (66.2 kg)  08/16/22 150 lb 2.1 oz (68.1 kg)  GEN: Well nourished, well developed in no acute distress NECK: No JVD; No carotid bruits CARDIAC: Irregularly irregular, no murmurs, rubs, gallops RESPIRATORY:  Clear to auscultation without rales, wheezing or rhonchi  ABDOMEN: Soft, non-tender, non-distended EXTREMITIES:  No edema; No deformity   ASSESSMENT AND PLAN: .   1.  Chronic diastolic CHF -weight is coming down -lasix 20mg  BID with PRN 20mg   -Continue current medications which include Eliquis 5 mg twice a day, Lipitor 40 mg daily, Lopressor 50 mg twice a day, potassium 20 mEq twice a day -Some swelling in her lower left extremity today which is always the case, extensive bruising  2.  Chronic mitral insufficiency -This has been evaluated by Patricia [name] and treated medically -Patient asymptomatic at this time  3.  HTN -Blood pressure well-controlled today -Continue current medication  regimen -Continue low-sodium, heart healthy diet  4.  Permanent atrial fibrillation -Rate controlled atrial fibrillation today -Continue current medication regimen -Asymptomatic at this time  5.  OSA -not addressed today      Dispo: Follow-up in 3 to 4 months with myself or Patricia [name]  Signed, Sharlene Dory, PA-C

## 2022-08-24 NOTE — Progress Notes (Signed)
   Established Patient Office Visit  Subjective   Patient ID: Patricia [name], female    DOB: 1941/02/13  Age: 82 y.o. MRN: [id number]  Chief Complaint  Patient presents with   Hospitalization Follow-up    HPI Patricia [name] is a 82 y.o. female presenting today for follow up of hospitalization from 08/13/2022 through 08/16/2022.  She was sent to the ED from oncology due to atrial fibrillation with RVR and associated increased dyspnea, dizziness, lower extremity swelling.  Cardiology was consulted, initially on Cardizem drip and IV Lasix but developed hypotension.  Switch to oral metoprolol and digoxin with improved heart rate.  1 oral dose Lasix on 08/15/2022, cardiology cleared patient for discharge on oral digoxin and metoprolol.  Recommended to follow-up with PCP, cardiology, oncology. Her legs are still swollen today, right > left, but have improved. She states she feels fairly well, though she is not always consistent with her exercises after surgery.  ROS Negative unless otherwise noted in HPI   Objective:     BP 127/84 (BP Location: Right Arm, Patient Position: Sitting, Cuff Size: Normal)   Pulse 100   Resp 18   Ht 5' (1.524 m)   Wt 146 lb (66.2 kg)   SpO2 95%   BMI 28.51 kg/m   Physical Exam Constitutional:      General: She is not in acute distress.    Appearance: Normal appearance.  HENT:     Head: Normocephalic and atraumatic.  Neck:     Vascular: No carotid bruit.  Cardiovascular:     Rate and Rhythm: Normal rate. Rhythm irregular.     Pulses: Normal pulses.     Heart sounds: No murmur heard.    No friction rub. No gallop.  Pulmonary:     Effort: Pulmonary effort is normal. No respiratory distress.     Breath sounds: No wheezing, rhonchi or rales.  Musculoskeletal:     Right lower leg: Edema (2+ pitting) present.     Left lower leg: Edema (1+ pitting) present.  Skin:    General: Skin is warm and dry.  Neurological:     Mental Status: She is alert and oriented to  person, place, and time.     Assessment & Plan:  Atrial fibrillation with RVR (HCC) -     CBC with Differential/Platelet; Future -     Comprehensive metabolic panel; Future  Hospital discharge follow-up  Repeating CBC and CMP per discharge recommendations. Continue oral digoxin and metoprolol as per cardiology recommendations. Continue low-sodium diet and fluid restriction.  Use Lasix 20 mg daily as needed for weight gain of more than 3 pounds or increasing shortness of breath.  Change in therapy as directed by cardiology at follow-up appointment later today.  Return for routine follow up on 10/22/2022, fasting labs 1 week before (TSH, lipids, CBC, CMP).    [name], PA

## 2022-08-25 ENCOUNTER — Encounter: Payer: Self-pay | Admitting: Hematology & Oncology

## 2022-08-25 ENCOUNTER — Encounter: Payer: Self-pay | Admitting: Neurology

## 2022-08-25 ENCOUNTER — Ambulatory Visit (INDEPENDENT_AMBULATORY_CARE_PROVIDER_SITE_OTHER): Payer: Medicare Other | Admitting: Neurology

## 2022-08-25 ENCOUNTER — Other Ambulatory Visit: Payer: Self-pay

## 2022-08-25 VITALS — BP 118/64 | HR 85 | Ht 60.0 in | Wt 147.0 lb

## 2022-08-25 DIAGNOSIS — G40909 Epilepsy, unspecified, not intractable, without status epilepticus: Secondary | ICD-10-CM | POA: Diagnosis not present

## 2022-08-25 DIAGNOSIS — Z8673 Personal history of transient ischemic attack (TIA), and cerebral infarction without residual deficits: Secondary | ICD-10-CM

## 2022-08-25 DIAGNOSIS — R413 Other amnesia: Secondary | ICD-10-CM | POA: Diagnosis not present

## 2022-08-25 LAB — CBC WITH DIFFERENTIAL/PLATELET
Basophils Absolute: 0 10*3/uL (ref 0.0–0.2)
Basos: 1 %
EOS (ABSOLUTE): 0 10*3/uL (ref 0.0–0.4)
Eos: 0 %
Hematocrit: 36.5 % (ref 34.0–46.6)
Hemoglobin: 11.6 g/dL (ref 11.1–15.9)
Immature Grans (Abs): 0.1 10*3/uL (ref 0.0–0.1)
Immature Granulocytes: 1 %
Lymphocytes Absolute: 1.2 10*3/uL (ref 0.7–3.1)
Lymphs: 17 %
MCH: 32 pg (ref 26.6–33.0)
MCHC: 31.8 g/dL (ref 31.5–35.7)
MCV: 101 fL — ABNORMAL HIGH (ref 79–97)
Monocytes Absolute: 0.9 10*3/uL (ref 0.1–0.9)
Monocytes: 12 %
Neutrophils Absolute: 5.1 10*3/uL (ref 1.4–7.0)
Neutrophils: 69 %
Platelets: 423 10*3/uL (ref 150–450)
RBC: 3.62 x10E6/uL — ABNORMAL LOW (ref 3.77–5.28)
RDW: 16.5 % — ABNORMAL HIGH (ref 11.7–15.4)
WBC: 7.4 10*3/uL (ref 3.4–10.8)

## 2022-08-25 LAB — COMPREHENSIVE METABOLIC PANEL
ALT: 22 IU/L (ref 0–32)
AST: 24 IU/L (ref 0–40)
Albumin: 3.6 g/dL — ABNORMAL LOW (ref 3.7–4.7)
Alkaline Phosphatase: 169 IU/L — ABNORMAL HIGH (ref 44–121)
BUN/Creatinine Ratio: 22 (ref 12–28)
BUN: 18 mg/dL (ref 8–27)
Bilirubin Total: 0.5 mg/dL (ref 0.0–1.2)
CO2: 21 mmol/L (ref 20–29)
Calcium: 8.4 mg/dL — ABNORMAL LOW (ref 8.7–10.3)
Chloride: 108 mmol/L — ABNORMAL HIGH (ref 96–106)
Creatinine, Ser: 0.83 mg/dL (ref 0.57–1.00)
Globulin, Total: 2.4 g/dL (ref 1.5–4.5)
Glucose: 113 mg/dL — ABNORMAL HIGH (ref 70–99)
Potassium: 4.8 mmol/L (ref 3.5–5.2)
Sodium: 141 mmol/L (ref 134–144)
Total Protein: 6 g/dL (ref 6.0–8.5)
eGFR: 71 mL/min/{1.73_m2} (ref >59)

## 2022-08-25 NOTE — Patient Instructions (Signed)
I had a long discussion with the patient and her husband regarding her memory loss and cognitive impairment which appears stable.  Continue Prevagen, Cerefolin and Namenda in the current dosages and increased participation in cognitively challenging activities like solving crossword puzzles, playing bridge and sudoku we also discussed memory compensation strategies.  Continue Keppra for seizure prophylaxis as she is tolerating it well without any breakthrough seizures.  Continue Eliquis for stroke prevention for atrial fibrillation and maintain aggressive risk factor modification.  Check screening follow-up carotid ultrasound study.  Return for follow-up in the future in 6 months with Shanda Bumps nurse practitioner call earlier if necessary.  Memory Compensation Strategies  Use "WARM" strategy.  W= write it down  A= associate it  R= repeat it  M= make a mental note  2.   You can keep a Glass blower/designer.  Use a 3-ring notebook with sections for the following: calendar, important names and phone numbers,  medications, doctors' names/phone numbers, lists/reminders, and a section to journal what you did  each day.   3.    Use a calendar to write appointments down.  4.    Write yourself a schedule for the day.  This can be placed on the calendar or in a separate section of the Memory Notebook.  Keeping a  regular schedule can help memory.  5.    Use medication organizer with sections for each day or morning/evening pills.  You may need help loading it  6.    Keep a basket, or pegboard by the door.  Place items that you need to take out with you in the basket or on the pegboard.  You may also want to  include a message board for reminders.  7.    Use sticky notes.  Place sticky notes with reminders in a place where the task is performed.  For example: " turn off the  stove" placed by the stove, "lock the door" placed on the door at eye level, " take your medications" on  the bathroom mirror or by the  place where you normally take your medications.  8.    Use alarms/timers.  Use while cooking to remind yourself to check on food or as a reminder to take your medicine, or as a  reminder to make a call, or as a reminder to perform another task, etc.

## 2022-08-25 NOTE — Progress Notes (Signed)
[facility] [address] [place]. [place] [address] [phone]       OFFICE FOLLOW-UP NOTE  Patricia [name] Date of Birth:  02-27-1941 Medical Record Number:  [id number]   HPI: Initial visit 03/27/2019 Ms. Toman is a 82 year old pleasant Caucasian lady with past medical history of left breast cancer with radiation, persistent atrial fibrillation on Eliquis, multinodular goiter, laryngeal spasms, essential hypertension, chronic kidney disease, diastolic heart failure, and hyperlipidemia who was last seen in office on 11/19/2016.  She is seen today for follow-up following recent ER visit on November 30, 2018.  She was doing well until 2 days prior when the husband sent her to another room at home to get her checkbook.  The patient recalled trying to move a rolling chair out of her way and suddenly she fell backwards leaning on the wall and slid down to the floor.  The husband arrived a few minutes later and noted that she was unresponsive and was not speaking or answering his questions.  He called EMS and by the time they arrived patient was gradually returning back to normal.  She could not remember the episode and did not have a memory of the event.  She denied any chest pain, sweating, palpitations, headache, tongue bite injury or incontinence.  CT scan of the head was unremarkable and MRI scan showed no evidence of acute stroke or hemorrhage.  EEG showed bitemporal focal slowing.  Patient was on Eliquis for atrial fibrillation and prior stroke which was continued.  She states she is done well since then and has not had any further episodes of office consciousness or seizures.  The patient's husband however feels that is noticed that her memory is not the same and she has had some cognitive decline.  There have been no episodes of confusion, disorientation or agitation.  She has not had any delusions hallucinations.  She has not had any lab work to look for reversible causes of  cognitive impairment.  She denies any headache, falls or injuries.  She feels her balance may be slightly off. Update 06/22/2019 : She returns for follow-up after last visit 6 months ago. She is accompanied by her husband. She states that him memory difficulties are about unchanged. She is started taking Prevagen 10 mg 1 capsule daily and tolerating it well. She has had no further episodes of confusion or seizure-like episode and she remains on Keppra XR 500 mg daily which is tolerating well without side effects. She has had no recurrent stroke or TIAs either. She remains on Eliquis and is tolerating it well with minor bruising but no bleeding. Blood pressure is well controlled though it is elevated today in office. She has been participating in mentally challenging activities and she does have family history of Alzheimer's and worries about it and wants me to try something else to help . Update 01/04/2020: She returns for follow-up after last visit 6 months ago.  She is accompanied by her husband.  She states she is doing well.  Subjective memory difficulties appear about unchanged.  She remains on provision as well as she started taking Cerefolin NAC and is tolerating it well.  She has had no episodes of seizure confusion.  She is tolerating Keppra XR 500 mg without any side effects.  She has had no recurrent stroke or TIAs either.  She remains on Eliquis which is tolerating well without bruising or bleeding.  Her blood pressures well controlled and usually in the 854 systolic range  and today it is 117/74 in office.  She has no new complaints. Update 07/15/2020 : She returns for follow-up after last visit 6 months ago.  She is accompanied by her husband.  Patient states she is doing well and the memory difficulties are unchanged however the husband feels that the short-term memory is getting worse.  However she remains quite independent in activities of daily living.  She just does not remember to write things  down.  She still ending most of her own affairs.  She remains on Prevagen as well as Cerefolin NAC.  She plays bridge regularly but is not doing any other cognitively challenging activities.  She has had no recurrent stroke or TIA symptoms.  She has had no other episodes of loss of consciousness or seizures either.  She remains on Eliquis which is tolerating well with only minor bruising and no bleeding.  Blood pressure is well controlled.  Dr. Radford Pax her cardiologist increase the dose of metoprolol and blood pressure today is 104/73 and heart rate is 58.  She also remains on Keppra XR 5 mg daily which she is tolerating well without side effects.  She did have follow-up carotid ultrasound done on 01/31/2020 which showed no significant extracranial stenosis.  She has no new complaints. Update 09/12/2020 : She returns for follow-up after last visit with me 2 months ago.  This appointment was made when the husband called requesting she be seen sooner.  She had an episode a week ago when she was to drive to her dentist and her husband program the address and the GPS and the GPS took her through a different route and she appeared confused and lost and missed her appointment.  She came home an hour later and.  Slightly disoriented and frustrated but quickly returned back to baseline.  She denied any headache, tongue bite, seizure-like activity.  She continues to have mild short-term memory difficulties but does do crossword puzzles and mentally challenging activities.  She has had no recurrent TIA or strokelike episodes or seizure-like episodes.  She remains on Keppra which is tolerating well without side effects.  She is also on Eliquis tolerating it well without bruising or bleeding.  Update 02/04/2021 JM: Returns for sooner scheduled visit per request of husband due to concern of worsening memory.  She was seen by Dr. Leonie Man 5 months ago.  She is accompanied today by her husband.  She believes her memory has been stable  but per husband, some decline more so with short-term memory such as remembering recent conversations or events. He has been trying to encourage her to write things down in a planner but is resistant to do so.  Able to maintain ADLs and majority of IADLs independently. Husband pays bills and she only drives short distance.  She will occasionally do memory exercises.  Denies depression or anxiety.  Continues on Prevagen and Cerefolin. MMSE today 24/30 (prior 27/30). Just received CPAP machine and trying to get used to using it.  No recurrent stroke/TIA symptoms or seizure-like episodes.  Compliant on Keppra -denies side effects.  Compliant on Eliquis for history of A. fib and stroke prevention without side effects.  Blood pressure stable at 115/81 today.  No further concerns at this time.  Update 08/06/2021 JM: Patient returns for 52-month follow-up accompanied by her husband.  Per patient, cognition has been stable although husband believes it has slightly declined. MMSE today 23/30 (prior 24/30).  She has remained on Namenda, Cerefolin and Prevagen, denies side  effects.  She plays bridge weekly, does not do any other type of memory exercise. Sleeps well, currently using CPAP machine.  She currently only drives short distance (1-2 mi from home) as she got lost a couple times driving long distance.  Continues to maintain ADLs independently.  Completed neurocognitive evaluation on 1/24 with testing suggestive of frontal-subcortical dysfunction as well as some indication of possible mesial temporal lobe dysfunction likely multifactoral with contribution from several vascular risk factors as well as signs of potential early Alzheimer's pathology.  As requested by neuropsych, completed MRI which showed moderate generalized atrophy and known right superior temporal ischemic infarct.    Denies any seizure activity and remains on Keppra, denies side effects.  Stable from stroke standpoint and remains on Eliquis without  side effects.  Blood pressure today 106/73. Occasionally monitors at home which has been stable.   Patient is currently being followed by oncology with history of early-stage breast cancer with more recent diagnosis of metastatic carcinoma, currently receiving radiation therapy to L5 vertebral body which was started on 5/8 and is to be completed tomorrow.  Has follow-up with oncology next week, per husband, she may not need to proceed chemo but they plan on discussing this at follow-up visit.  No further concerns at this time.   Update 02/10/2022 JM: Patient returns for 48-month follow-up accompanied by her husband.  Per husband, believes cognition has been relatively the same, can have good days and bad days Remains on Namenda, Cerefolin and Prevagen MMSE today 27/30 (prior 23/30) Continues to play weekly bridge, drive short distance/familiar places without issues  Denies any witnessed seizure activity Remains on Keppra  No new stroke/TIA symptoms. Remains on Eliquis and atorvastatin managed by PCP/cardiology Reports nighty BiPAP usage  UPDATE 08/25/2022 : She returns for follow-up after last visit 6 months ago with Shanda Bumps nurse practitioner.  She continues to do well without recurrent stroke or TIA symptoms.  She remains on Eliquis which is tolerating well without bruising or bleeding.  She states her blood pressure is under good control.  She remains on Lipitor 40 mg daily which is tolerating well without muscle aches and pains.  Lab work on 07/20/2022 showed hemoglobin A1c to be 6.3.  She had a fall on June 13-spent 3 days in the hospital.  She was found to be in A-fib with RVR with shortness of breath and lower extremity swelling and dizziness.  Cardiac medications were changed and she was started on oral digoxin and metoprolol by cardiology.  She remains on Keppra which is tolerating well without side effects and has not had any further seizures.  She continues to have mild short-term memory  difficulties which are more or less unchanged.  She remains on Cerefolin, Prevagen and Namenda which she is tolerating well without side effects.  On Mini-Mental status exam testing today she scored 28/30 which is slightly improved from 27/30 at last visit.  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 Dx 04/2015 at time of stroke. b. DCCV 06/2015 - did not hold. Amio started then stopped due  to QT prolongation.   Chronic diastolic heart failure (HCC) 06/17/2015   CKD (chronic kidney disease), stage III (HCC)    Cough variant asthma    Dementia (HCC)    Diverticulosis    Essential hypertension    GERD (gastroesophageal reflux disease)    Glaucoma    Hiatal hernia    Hyperlipidemia    Hypothyroidism    Internal hemorrhoids    Laryngospasm    Mitral regurgitation    mild to moderate by echo 12/2019 and moderate by TEE 12/2020   Multinodular goiter    Osteopenia    Postmenopausal    Pulmonary HTN (HCC)     Moderate with PASP by echo 2018 likely Group 2 from pulmonary venous HTN from CHF>>normal PAP by echo 12/2019   Radiation 10/22/14-11/23/14   Left Breast   Stricture and stenosis of cervix    Stroke (HCC)    TIAs    Social History:  Social History   Socioeconomic History   Marital status: Married    Spouse name: Leonette Most   Number of children: 2   Years of education: Not on file   Highest education level: High school graduate  Occupational History   Occupation: Retired  Tobacco Use   Smoking status: Former    Packs/day: 0.75    Years: 5.00    Additional pack years: 0.00    Total pack years: 3.75    Types: Cigarettes    Quit date:  06/15/1966    Years since quitting: 56.2    Passive exposure: Past   Smokeless tobacco: Never  Vaping Use   Vaping Use: Never used  Substance and Sexual Activity   Alcohol use: Not Currently    Comment: occasional wine   Drug use: No   Sexual activity: Not Currently    Birth control/protection: Post-menopausal  Other Topics Concern   Not on file  Social History Narrative   Retired. Lives with husband.    Right Handed   Drinks 1-2 cups caffeine daily   Social Determinants of Health   Financial Resource Strain: Low Risk  (10/09/2021)   Overall Financial Resource Strain (CARDIA)    Difficulty of Paying Living Expenses: Not hard at all  Food Insecurity: No Food Insecurity (08/19/2022)   Hunger Vital Sign    Worried About Running Out of Food in the Last Year: Never true    Ran Out of Food in the Last Year: Never true  Transportation Needs: No Transportation Needs (08/19/2022)   PRAPARE - Administrator, Civil Service (Medical): No    Lack of Transportation (Non-Medical): No  Physical Activity: Insufficiently Active (10/09/2021)   Exercise Vital Sign    Days of Exercise per Week: 3 days    Minutes of Exercise per Session: 30 min  Stress: No Stress Concern Present (10/09/2021)   [employer] of Occupational Health - Occupational Stress Questionnaire    Feeling of Stress : Not at all  Social Connections: Socially Integrated (10/09/2021)   Social Connection and Isolation Panel [NHANES]    Frequency of Communication with Friends and Family: More than three times a week    Frequency of Social Gatherings with Friends and Family: Three times a week    Attends Religious Services: More than 4 times per year    Active Member of Clubs or Organizations: Yes    Attends Banker Meetings: More than 4 times per year    Marital Status: Married  Catering manager Violence: Not At  Risk (08/13/2022)   Humiliation, Afraid, Rape, and Kick questionnaire    Fear of Current or  Ex-Partner: No    Emotionally Abused: No    Physically Abused: No    Sexually Abused: No    Medications:   Current Outpatient Medications on File Prior to Visit  Medication Sig Dispense Refill   albuterol (PROVENTIL HFA;VENTOLIN HFA) 108 (90 Base) MCG/ACT inhaler Inhale 2 puffs into the lungs every 6 (six) hours as needed for wheezing or shortness of breath.     apixaban (ELIQUIS) 5 MG TABS tablet Take 1 tablet (5 mg total) by mouth 2 (two) times daily. 180 tablet 3   Apoaequorin (PREVAGEN) 10 MG CAPS Take 1 capsule by mouth every morning. 90 capsule 3   atorvastatin (LIPITOR) 40 MG tablet Take 1 tablet (40 mg total) by mouth daily before supper. 90 tablet 3   bimatoprost (LUMIGAN) 0.01 % SOLN Place 1 drop into both eyes 2 (two) times daily.     calcium-vitamin D (OSCAL WITH D) 500-200 MG-UNIT tablet Take 1 tablet by mouth daily with breakfast. 30 tablet 0   dexamethasone (DECADRON) 4 MG tablet Take 2 tablets (8 mg) by mouth daily for 3 days starting the day after chemotherapy. Take with food. 30 tablet 1   digoxin (LANOXIN) 0.125 MG tablet Take 1 tablet (0.125 mg total) by mouth daily. 30 tablet 0   dorzolamide-timolol (COSOPT) 22.3-6.8 MG/ML ophthalmic solution Place 1 drop into both eyes at bedtime.     fluticasone (FLONASE) 50 MCG/ACT nasal spray 1 spray each nostril following sinus rinses twice daily 16 g 2   fluticasone-salmeterol (ADVAIR HFA) 230-21 MCG/ACT inhaler USE 2 INHALATIONS TWICE A DAY 36 g 3   folic acid (FOLVITE) 400 MCG tablet Take 400 mcg by mouth daily.     furosemide (LASIX) 20 MG tablet Take 1 tablet (20 mg total) by mouth 2 (two) times daily. Take additional 20 mg as needed for swelling 180 tablet 2   gabapentin (NEURONTIN) 400 MG capsule TAKE 1 CAPSULE TWICE A DAY 180 capsule 3   L-Methylfolate-B12-B6-B2 (CEREFOLIN) 07-31-48-5 MG TABS Take 1 tablet by mouth every morning. 90 tablet 3   levETIRAcetam (KEPPRA XR) 500 MG 24 hr tablet Take 1 tablet (500 mg total) by mouth  at bedtime. 90 tablet 3   loratadine (CLARITIN) 10 MG tablet Take 10 mg by mouth daily.     memantine (NAMENDA) 10 MG tablet Take 1 tablet (10 mg total) by mouth 2 (two) times daily. 180 tablet 3   metoprolol tartrate (LOPRESSOR) 50 MG tablet Take 1 tablet (50 mg total) by mouth 2 (two) times daily. 60 tablet 0   Multiple Vitamins-Iron (MULTIVITAMIN/IRON) TABS Take 1 tablet by mouth daily.     Netarsudil Dimesylate (RHOPRESSA) 0.02 % SOLN Place 1 drop into both eyes at bedtime.     ondansetron (ZOFRAN) 8 MG tablet Take 1 tablet (8 mg total) by mouth every 8 (eight) hours as needed for nausea or vomiting. Start on the third day after chemotherapy. 30 tablet 1   OVER THE COUNTER MEDICATION Take 1 tablet by mouth daily. Zyflamend otc herbal supplement     pantoprazole (PROTONIX) 40 MG tablet TAKE 1 TABLET DAILY 90 tablet 3   potassium chloride SA (KLOR-CON M) 20 MEQ tablet TAKE 1 TABLET TWICE A DAY 180 tablet 3   prochlorperazine (COMPAZINE) 10 MG tablet Take 1 tablet (10 mg total) by mouth every 6 (six) hours as needed for nausea or vomiting. 30 tablet  1   thyroid (ARMOUR THYROID) 15 MG tablet Take 1 tablet (15 mg total) by mouth daily. Take with 60mg  for a total dose of 75mg  daily.     thyroid (ARMOUR THYROID) 60 MG tablet Take 1 tablet (60 mg total) by mouth daily. Takes with 15mg  daily.     traMADol (ULTRAM) 50 MG tablet Take 1 tablet (50 mg total) by mouth every 6 (six) hours as needed (post op pain). 20 tablet 0   Vitamin D, Ergocalciferol, (DRISDOL) 1.25 MG (50000 UNIT) CAPS capsule TAKE 1 CAPSULE WEEKLY 12 capsule 3   Current Facility-Administered Medications on File Prior to Visit  Medication Dose Route Frequency Provider Last Rate Last Admin   acetaminophen (TYLENOL) tablet 650 mg  650 mg Oral Once Josph Macho, MD       dexamethasone (DECADRON) 10 mg in sodium chloride 0.9 % 50 mL IVPB  10 mg Intravenous Once Josph Macho, MD       dextrose 5 % solution   Intravenous Once Josph Macho, MD       diphenhydrAMINE (BENADRYL) capsule 50 mg  50 mg Oral Once Josph Macho, MD       fosaprepitant (EMEND) 150 mg in sodium chloride 0.9 % 145 mL IVPB  150 mg Intravenous Once Josph Macho, MD       palonosetron (ALOXI) injection 0.25 mg  0.25 mg Intravenous Once Josph Macho, MD        Allergies:   Allergies  Allergen Reactions   Combigan [Brimonidine Tartrate-Timolol] Itching    Per patient made eyes red, sore, and sensitivity to light   Sulfa Antibiotics Hives and Rash   Sulfamethoxazole-Trimethoprim Hives    Physical Exam Today's Vitals   08/25/22 1345  BP: 118/64  Pulse: 85  Weight: 147 lb (66.7 kg)  Height: 5' (1.524 m)   Body mass index is 28.71 kg/m.  General: Petite elderly very pleasant Caucasian lady, seated, in no evident distress Head: head normocephalic and atraumatic.  Neck: supple with no carotid or supraclavicular bruits Cardiovascular: irregular rate and rhythm, no murmurs Musculoskeletal: Mild kyphoscoliosis Skin:  no rash/petichiae Vascular:  Normal pulses all extremities  Neurologic Exam Mental Status: Awake and fully alert.  Fluent speech and language.  Recent memory impaired and remote memory intact. Attention span, concentration and fund of knowledge slightly diminished. Mood and affect appropriate.      08/25/2022    1:49 PM 02/10/2022    8:02 AM 08/07/2021    8:35 AM  MMSE - Mini Mental State Exam  Orientation to time 4 2 0  Orientation to Place 5 5 4   Registration 3 3 3   Attention/ Calculation 5 5 5   Recall 2 3 2   Language- name 2 objects 2 2 2   Language- repeat 1 1 1   Language- follow 3 step command 3 3 3   Language- read & follow direction 1 1 1   Write a sentence 1 1 1   Copy design 1 1 1   Total score 28 27 23    Cranial Nerves: Pupils equal, briskly reactive to light. Extraocular movements full without nystagmus. Visual fields full to confrontation. HOH bilaterally. Facial sensation intact. Face, tongue, palate  moves normally and symmetrically.  Motor: Normal bulk and tone. Normal strength in all tested extremity muscles. Sensory.: intact to touch ,pinprick .position and vibratory sensation.  Coordination: Rapid alternating movements normal in all extremities. Finger-to-nose and heel-to-shin performed accurately bilaterally. Gait and Station: Arises from chair without difficulty. Stance  is normal. Gait demonstrates favoring of the left foot due to pain and uses a walker. Reflexes: 1+ and symmetric. Toes downgoing.        ASSESSMENT: 82 year old Caucasian lady with remote history of right MCA infarct from atrial fibrillation in February 2017.  Episode in 11/2018 of brief loss of consciousness followed by confusion possibly unwitnessed seizure with postictal confusion.  Also mild cognitive impairment with continued gradual decline of short-term memory. Episode of transient confusion and disorientation likely related to her baseline mild cognitive impairment doubt seizure in 08/2020.  Neurocognitive evaluation 03/2021 frontal-subcortical dysfunction and some indication of possible mesial temporal lobe dysfunction with possible etiology multifactorial with combination of several vascular risk factors and signs of potential early Alzheimer's pathology. 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  Continue Prevagen, Cerefolin and Namenda in the current dosages and increased participation in cognitively challenging activities like solving crossword puzzles, playing bridge and sudoku we also discussed memory compensation strategies.  Continue Keppra for seizure prophylaxis as she is tolerating it well without any breakthrough seizures.  Continue Eliquis for stroke prevention for atrial fibrillation and maintain aggressive risk factor modification.  Check  screening follow-up carotid ultrasound study.  Return for follow-up in the future in 6 months with Shanda Bumps nurse practitioner call earlier if necessary.  I spent 35 minutes of face-to-face and non-face-to-face time with patient and husband.  This included previsit chart review, lab review, study review, order entry, electronic health record documentation, patient and husband education and discussion regarding the above and answered all questions to patient and husband satisfaction Delia Heady, MD  [facility] [address] [place], [place] [address]  Phone [phone] Fax [phone] Note: This document was prepared with digital dictation and possible smart phrase technology. Any transcriptional errors that result from this process are unintentional.

## 2022-08-26 ENCOUNTER — Other Ambulatory Visit: Payer: Self-pay

## 2022-08-26 DIAGNOSIS — N183 Chronic kidney disease, stage 3 unspecified: Secondary | ICD-10-CM | POA: Diagnosis not present

## 2022-08-26 DIAGNOSIS — I272 Pulmonary hypertension, unspecified: Secondary | ICD-10-CM | POA: Diagnosis not present

## 2022-08-26 DIAGNOSIS — I4819 Other persistent atrial fibrillation: Secondary | ICD-10-CM | POA: Diagnosis not present

## 2022-08-26 DIAGNOSIS — I130 Hypertensive heart and chronic kidney disease with heart failure and stage 1 through stage 4 chronic kidney disease, or unspecified chronic kidney disease: Secondary | ICD-10-CM | POA: Diagnosis not present

## 2022-08-26 DIAGNOSIS — I5033 Acute on chronic diastolic (congestive) heart failure: Secondary | ICD-10-CM | POA: Diagnosis not present

## 2022-08-26 DIAGNOSIS — C50412 Malignant neoplasm of upper-outer quadrant of left female breast: Secondary | ICD-10-CM | POA: Diagnosis not present

## 2022-08-28 DIAGNOSIS — I4819 Other persistent atrial fibrillation: Secondary | ICD-10-CM | POA: Diagnosis not present

## 2022-08-28 DIAGNOSIS — C50412 Malignant neoplasm of upper-outer quadrant of left female breast: Secondary | ICD-10-CM | POA: Diagnosis not present

## 2022-08-28 DIAGNOSIS — I130 Hypertensive heart and chronic kidney disease with heart failure and stage 1 through stage 4 chronic kidney disease, or unspecified chronic kidney disease: Secondary | ICD-10-CM | POA: Diagnosis not present

## 2022-08-28 DIAGNOSIS — N183 Chronic kidney disease, stage 3 unspecified: Secondary | ICD-10-CM | POA: Diagnosis not present

## 2022-08-28 DIAGNOSIS — I5033 Acute on chronic diastolic (congestive) heart failure: Secondary | ICD-10-CM | POA: Diagnosis not present

## 2022-08-28 DIAGNOSIS — I272 Pulmonary hypertension, unspecified: Secondary | ICD-10-CM | POA: Diagnosis not present

## 2022-09-01 DIAGNOSIS — N183 Chronic kidney disease, stage 3 unspecified: Secondary | ICD-10-CM | POA: Diagnosis not present

## 2022-09-01 DIAGNOSIS — C50412 Malignant neoplasm of upper-outer quadrant of left female breast: Secondary | ICD-10-CM | POA: Diagnosis not present

## 2022-09-01 DIAGNOSIS — I5033 Acute on chronic diastolic (congestive) heart failure: Secondary | ICD-10-CM | POA: Diagnosis not present

## 2022-09-01 DIAGNOSIS — I272 Pulmonary hypertension, unspecified: Secondary | ICD-10-CM | POA: Diagnosis not present

## 2022-09-01 DIAGNOSIS — I4819 Other persistent atrial fibrillation: Secondary | ICD-10-CM | POA: Diagnosis not present

## 2022-09-01 DIAGNOSIS — I130 Hypertensive heart and chronic kidney disease with heart failure and stage 1 through stage 4 chronic kidney disease, or unspecified chronic kidney disease: Secondary | ICD-10-CM | POA: Diagnosis not present

## 2022-09-02 ENCOUNTER — Other Ambulatory Visit: Payer: Self-pay

## 2022-09-02 MED ORDER — METOPROLOL TARTRATE 50 MG PO TABS: 50.0000 mg | ORAL_TABLET | Freq: Two times a day (BID) | ORAL | 3 refills | Status: DC

## 2022-09-03 ENCOUNTER — Other Ambulatory Visit: Payer: Self-pay | Admitting: Adult Health

## 2022-09-04 ENCOUNTER — Other Ambulatory Visit: Payer: Self-pay | Admitting: *Deleted

## 2022-09-04 ENCOUNTER — Other Ambulatory Visit: Payer: Self-pay

## 2022-09-04 DIAGNOSIS — I5033 Acute on chronic diastolic (congestive) heart failure: Secondary | ICD-10-CM | POA: Diagnosis not present

## 2022-09-04 DIAGNOSIS — I272 Pulmonary hypertension, unspecified: Secondary | ICD-10-CM | POA: Diagnosis not present

## 2022-09-04 DIAGNOSIS — N183 Chronic kidney disease, stage 3 unspecified: Secondary | ICD-10-CM | POA: Diagnosis not present

## 2022-09-04 DIAGNOSIS — C50412 Malignant neoplasm of upper-outer quadrant of left female breast: Secondary | ICD-10-CM

## 2022-09-04 DIAGNOSIS — D649 Anemia, unspecified: Secondary | ICD-10-CM

## 2022-09-04 DIAGNOSIS — I4819 Other persistent atrial fibrillation: Secondary | ICD-10-CM

## 2022-09-04 DIAGNOSIS — Z171 Estrogen receptor negative status [ER-]: Secondary | ICD-10-CM

## 2022-09-04 DIAGNOSIS — I4891 Unspecified atrial fibrillation: Secondary | ICD-10-CM

## 2022-09-04 DIAGNOSIS — R600 Localized edema: Secondary | ICD-10-CM

## 2022-09-04 DIAGNOSIS — C7951 Secondary malignant neoplasm of bone: Secondary | ICD-10-CM

## 2022-09-04 DIAGNOSIS — I130 Hypertensive heart and chronic kidney disease with heart failure and stage 1 through stage 4 chronic kidney disease, or unspecified chronic kidney disease: Secondary | ICD-10-CM | POA: Diagnosis not present

## 2022-09-04 MED ORDER — DIGOXIN 125 MCG PO TABS: 0.1250 mg | ORAL_TABLET | Freq: Every day | ORAL | 3 refills | Status: DC

## 2022-09-07 ENCOUNTER — Inpatient Hospital Stay: Payer: Medicare Other | Attending: Hematology & Oncology

## 2022-09-07 ENCOUNTER — Encounter: Payer: Self-pay | Admitting: Hematology & Oncology

## 2022-09-07 ENCOUNTER — Inpatient Hospital Stay: Payer: Medicare Other

## 2022-09-07 ENCOUNTER — Other Ambulatory Visit: Payer: Self-pay

## 2022-09-07 ENCOUNTER — Inpatient Hospital Stay (HOSPITAL_BASED_OUTPATIENT_CLINIC_OR_DEPARTMENT_OTHER): Payer: Medicare Other | Admitting: Hematology & Oncology

## 2022-09-07 VITALS — BP 134/92 | HR 99 | Temp 98.1°F | Resp 18 | Ht 60.0 in | Wt 140.0 lb

## 2022-09-07 DIAGNOSIS — R600 Localized edema: Secondary | ICD-10-CM

## 2022-09-07 DIAGNOSIS — C7951 Secondary malignant neoplasm of bone: Secondary | ICD-10-CM

## 2022-09-07 DIAGNOSIS — I4891 Unspecified atrial fibrillation: Secondary | ICD-10-CM

## 2022-09-07 DIAGNOSIS — M810 Age-related osteoporosis without current pathological fracture: Secondary | ICD-10-CM

## 2022-09-07 DIAGNOSIS — Z5112 Encounter for antineoplastic immunotherapy: Secondary | ICD-10-CM | POA: Insufficient documentation

## 2022-09-07 DIAGNOSIS — C50412 Malignant neoplasm of upper-outer quadrant of left female breast: Secondary | ICD-10-CM

## 2022-09-07 DIAGNOSIS — D649 Anemia, unspecified: Secondary | ICD-10-CM

## 2022-09-07 DIAGNOSIS — Z170 Estrogen receptor positive status [ER+]: Secondary | ICD-10-CM | POA: Diagnosis not present

## 2022-09-07 DIAGNOSIS — I4819 Other persistent atrial fibrillation: Secondary | ICD-10-CM

## 2022-09-07 DIAGNOSIS — M8580 Other specified disorders of bone density and structure, unspecified site: Secondary | ICD-10-CM

## 2022-09-07 DIAGNOSIS — Z171 Estrogen receptor negative status [ER-]: Secondary | ICD-10-CM | POA: Insufficient documentation

## 2022-09-07 DIAGNOSIS — C50812 Malignant neoplasm of overlapping sites of left female breast: Secondary | ICD-10-CM | POA: Diagnosis not present

## 2022-09-07 LAB — CMP (CANCER CENTER ONLY)
ALT: 19 U/L (ref 0–44)
AST: 23 U/L (ref 15–41)
Albumin: 4 g/dL (ref 3.5–5.0)
Alkaline Phosphatase: 106 U/L (ref 38–126)
Anion gap: 9 (ref 5–15)
BUN: 27 mg/dL — ABNORMAL HIGH (ref 8–23)
CO2: 26 mmol/L (ref 22–32)
Calcium: 9.9 mg/dL (ref 8.9–10.3)
Chloride: 106 mmol/L (ref 98–111)
Creatinine: 0.87 mg/dL (ref 0.44–1.00)
GFR, Estimated: >60 mL/min (ref >60)
Glucose, Bld: 135 mg/dL — ABNORMAL HIGH (ref 70–99)
Potassium: 4.1 mmol/L (ref 3.5–5.1)
Sodium: 141 mmol/L (ref 135–145)
Total Bilirubin: 0.5 mg/dL (ref 0.3–1.2)
Total Protein: 7.1 g/dL (ref 6.5–8.1)

## 2022-09-07 LAB — CBC WITH DIFFERENTIAL (CANCER CENTER ONLY)
Abs Immature Granulocytes: 0.04 10*3/uL (ref 0.00–0.07)
Basophils Absolute: 0.1 10*3/uL (ref 0.0–0.1)
Basophils Relative: 1 %
Eosinophils Absolute: 0 10*3/uL (ref 0.0–0.5)
Eosinophils Relative: 0 %
HCT: 39.7 % (ref 36.0–46.0)
Hemoglobin: 12.7 g/dL (ref 12.0–15.0)
Immature Granulocytes: 1 %
Lymphocytes Relative: 19 %
Lymphs Abs: 1.5 10*3/uL (ref 0.7–4.0)
MCH: 32.9 pg (ref 26.0–34.0)
MCHC: 32 g/dL (ref 30.0–36.0)
MCV: 102.8 fL — ABNORMAL HIGH (ref 80.0–100.0)
Monocytes Absolute: 0.7 10*3/uL (ref 0.1–1.0)
Monocytes Relative: 9 %
Neutro Abs: 5.5 10*3/uL (ref 1.7–7.7)
Neutrophils Relative %: 70 %
Platelet Count: 271 10*3/uL (ref 150–400)
RBC: 3.86 MIL/uL — ABNORMAL LOW (ref 3.87–5.11)
RDW: 15.8 % — ABNORMAL HIGH (ref 11.5–15.5)
WBC Count: 7.8 10*3/uL (ref 4.0–10.5)
nRBC: 0 % (ref 0.0–0.2)

## 2022-09-07 LAB — LACTATE DEHYDROGENASE: LDH: 261 U/L — ABNORMAL HIGH (ref 98–192)

## 2022-09-07 MED ORDER — DEXTROSE 5 % IV SOLN: Freq: Once | INTRAVENOUS | Status: AC

## 2022-09-07 MED ORDER — SODIUM CHLORIDE 0.9 % IV SOLN
150.0000 mg | Freq: Once | INTRAVENOUS | Status: AC
  Administered 2022-09-07: 150 mg via INTRAVENOUS
  Filled 2022-09-07: qty 150

## 2022-09-07 MED ORDER — DENOSUMAB 120 MG/1.7ML ~~LOC~~ SOLN
120.0000 mg | Freq: Once | SUBCUTANEOUS | Status: AC
  Administered 2022-09-07: 120 mg via SUBCUTANEOUS
  Filled 2022-09-07: qty 1.7

## 2022-09-07 MED ORDER — ACETAMINOPHEN 325 MG PO TABS: 650.0000 mg | ORAL_TABLET | Freq: Once | ORAL | Status: DC

## 2022-09-07 MED ORDER — FAM-TRASTUZUMAB DERUXTECAN-NXKI CHEMO 100 MG IV SOLR
350.0000 mg | Freq: Once | INTRAVENOUS | Status: AC
  Administered 2022-09-07: 360 mg via INTRAVENOUS
  Filled 2022-09-07: qty 18

## 2022-09-07 MED ORDER — PALONOSETRON HCL INJECTION 0.25 MG/5ML
0.2500 mg | Freq: Once | INTRAVENOUS | Status: AC
  Administered 2022-09-07: 0.25 mg via INTRAVENOUS
  Filled 2022-09-07: qty 5

## 2022-09-07 MED ORDER — DIPHENHYDRAMINE HCL 25 MG PO CAPS: 50.0000 mg | ORAL_CAPSULE | Freq: Once | ORAL | Status: DC

## 2022-09-07 MED ORDER — SODIUM CHLORIDE 0.9 % IV SOLN
10.0000 mg | Freq: Once | INTRAVENOUS | Status: AC
  Administered 2022-09-07: 10 mg via INTRAVENOUS
  Filled 2022-09-07: qty 10

## 2022-09-07 NOTE — Patient Instructions (Signed)
[facility]  Discharge Instructions: Thank you for choosing [facility] to provide your oncology and hematology care.   If you have a lab appointment with the Cancer Center, please go directly to the Cancer Center and check in at the registration area.  Wear comfortable clothing and clothing appropriate for easy access to any Portacath or PICC line.   We strive to give you quality time with your provider. You may need to reschedule your appointment if you arrive late (15 or more minutes).  Arriving late affects you and other patients whose appointments are after yours.  Also, if you miss three or more appointments without notifying the office, you may be dismissed from the clinic at the provider's discretion.      For prescription refill requests, have your pharmacy contact our office and allow 72 hours for refills to be completed.    Today you received the following chemotherapy and/or immunotherapy agents xgeva, inhertu      To help prevent nausea and vomiting after your treatment, we encourage you to take your nausea medication as directed.  BELOW ARE SYMPTOMS THAT SHOULD BE REPORTED IMMEDIATELY: *FEVER GREATER THAN 100.4 F (38 C) OR HIGHER *CHILLS OR SWEATING *NAUSEA AND VOMITING THAT IS NOT CONTROLLED WITH YOUR NAUSEA MEDICATION *UNUSUAL SHORTNESS OF BREATH *UNUSUAL BRUISING OR BLEEDING *URINARY PROBLEMS (pain or burning when urinating, or frequent urination) *BOWEL PROBLEMS (unusual diarrhea, constipation, pain near the anus) TENDERNESS IN MOUTH AND THROAT WITH OR WITHOUT PRESENCE OF ULCERS (sore throat, sores in mouth, or a toothache) UNUSUAL RASH, SWELLING OR PAIN  UNUSUAL VAGINAL DISCHARGE OR ITCHING   Items with * indicate a potential emergency and should be followed up as soon as possible or go to the Emergency Department if any problems should occur.  Please show the CHEMOTHERAPY ALERT CARD or IMMUNOTHERAPY ALERT CARD at  check-in to the Emergency Department and triage nurse. Should you have questions after your visit or need to cancel or reschedule your appointment, please contact [place] [facility] [place]  [phone] and follow the prompts.  Office hours are 8:00 a.m. to 4:30 p.m. Monday - Friday. Please note that voicemails left after 4:00 p.m. may not be returned until the following business day.  We are closed weekends and major holidays. You have access to a nurse at all times for urgent questions. Please call the main number to the clinic [phone] and follow the prompts.  For any non-urgent questions, you may also contact your provider using MyChart. We now offer e-Visits for anyone 63 and older to request care online for non-urgent symptoms. For details visit [email or web address].   Also download the MyChart app! Go to the app store, search "MyChart", open the app, select [place], and log in with your MyChart username and password.

## 2022-09-07 NOTE — Progress Notes (Signed)
Hematology and Oncology Follow Up Visit  Patricia [name] [id number] 03/12/1940 82 y.o. 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 is back for follow-up.  When she came in last time for her second cycle of treatment, she was found to be in rapid atrial fibrillation.  She was subsequently admitted.  Cardiology has been following her and trying to manage the atrial fibrillation.  She still has atrial fibrillation but is under decent control.  I cannot imagine that the atrial fibrillation is secondary to the Enhertu.  Her last CA 27.29 was down to 34.  As such, I think that the Enhertu has helped.  She is getting around with a rolling walker.  She had a prophylactic pinning of her left hip.  She seems to be doing pretty well with this.  She has had no problems with cough or shortness of breath.  She has had no fever.  She has had no change in bowel or bladder habits.  She has had no leg swelling.  Overall, I would say that her performance status is probably ECOG 2.    Wt Readings from Last 3 Encounters:  09/07/22 140 lb (63.5 kg)  08/25/22 147 lb (66.7 kg)  08/24/22 146 lb (66.2 kg)     Medications:  Current Outpatient Medications:    albuterol (PROVENTIL HFA;VENTOLIN HFA) 108 (90 Base) MCG/ACT inhaler, Inhale 2 puffs into the lungs every 6 (six) hours as needed for wheezing or shortness of breath., Disp: , Rfl:    apixaban (ELIQUIS) 5 MG TABS tablet, Take 1 tablet (5 mg total) by mouth 2 (two) times daily., Disp: 180 tablet, Rfl: 3   Apoaequorin (PREVAGEN) 10 MG CAPS, Take 1 capsule by mouth every morning., Disp: 90 capsule, Rfl:  3   atorvastatin (LIPITOR) 40 MG tablet, Take 1 tablet (40 mg total) by mouth daily before supper., Disp: 90 tablet, Rfl: 3   bimatoprost (LUMIGAN) 0.01 % SOLN, Place 1 drop into both eyes 2 (two) times daily., Disp: , Rfl:    calcium-vitamin D (OSCAL WITH D) 500-200 MG-UNIT tablet, Take 1 tablet by mouth daily with breakfast., Disp: 30 tablet, Rfl: 0   dexamethasone (DECADRON) 4 MG tablet, Take 2 tablets (8 mg) by mouth daily for 3 days starting the day after chemotherapy. Take with food., Disp: 30 tablet, Rfl: 1   digoxin (LANOXIN) 0.125 MG tablet, Take 1 tablet (0.125 mg total) by mouth daily., Disp: 90 tablet, Rfl: 3   dorzolamide-timolol (COSOPT) 22.3-6.8 MG/ML ophthalmic solution, Place 1 drop into both eyes at bedtime., Disp: , Rfl:    fluticasone (FLONASE) 50 MCG/ACT nasal spray, 1 spray each nostril following sinus rinses twice daily, Disp: 16 g, Rfl: 2   fluticasone-salmeterol (ADVAIR HFA) 230-21 MCG/ACT inhaler, USE 2 INHALATIONS TWICE A DAY, Disp: 36 g, Rfl: 3   folic acid (FOLVITE) 400 MCG tablet, Take 400 mcg by mouth daily., Disp: , Rfl:    furosemide (LASIX) 20 MG tablet, Take 1 tablet (20 mg total) by mouth 2 (two) times daily. Take additional 20 mg as needed for swelling, Disp: 180 tablet, Rfl: 2   gabapentin (NEURONTIN) 400 MG capsule, TAKE  1 CAPSULE TWICE A DAY, Disp: 180 capsule, Rfl: 3   L-Methylfolate-B12-B6-B2 (CEREFOLIN) 07-31-48-5 MG TABS, Take 1 tablet by mouth every morning., Disp: 90 tablet, Rfl: 3   levETIRAcetam (KEPPRA XR) 500 MG 24 hr tablet, TAKE 1 TABLET AT BEDTIME, Disp: 90 tablet, Rfl: 3   loratadine (CLARITIN) 10 MG tablet, Take 10 mg by mouth daily., Disp: , Rfl:    memantine (NAMENDA) 10 MG tablet, Take 1 tablet (10 mg total) by mouth 2 (two) times daily., Disp: 180 tablet, Rfl: 3   metoprolol tartrate (LOPRESSOR) 50 MG tablet, Take 1 tablet (50 mg total) by mouth 2 (two) times daily., Disp: 180 tablet, Rfl: 3   Multiple Vitamins-Iron (MULTIVITAMIN/IRON) TABS,  Take 1 tablet by mouth daily., Disp: , Rfl:    Netarsudil Dimesylate (RHOPRESSA) 0.02 % SOLN, Place 1 drop into both eyes at bedtime., Disp: , Rfl:    ondansetron (ZOFRAN) 8 MG tablet, Take 1 tablet (8 mg total) by mouth every 8 (eight) hours as needed for nausea or vomiting. Start on the third day after chemotherapy., Disp: 30 tablet, Rfl: 1   OVER THE COUNTER MEDICATION, Take 1 tablet by mouth daily. Zyflamend otc herbal supplement, Disp: , Rfl:    pantoprazole (PROTONIX) 40 MG tablet, TAKE 1 TABLET DAILY, Disp: 90 tablet, Rfl: 3   potassium chloride SA (KLOR-CON M) 20 MEQ tablet, TAKE 1 TABLET TWICE A DAY, Disp: 180 tablet, Rfl: 3   prochlorperazine (COMPAZINE) 10 MG tablet, Take 1 tablet (10 mg total) by mouth every 6 (six) hours as needed for nausea or vomiting., Disp: 30 tablet, Rfl: 1   thyroid (ARMOUR THYROID) 15 MG tablet, Take 1 tablet (15 mg total) by mouth daily. Take with 60mg  for a total dose of 75mg  daily., Disp: , Rfl:    thyroid (ARMOUR THYROID) 60 MG tablet, Take 1 tablet (60 mg total) by mouth daily. Takes with 15mg  daily., Disp: , Rfl:    traMADol (ULTRAM) 50 MG tablet, Take 1 tablet (50 mg total) by mouth every 6 (six) hours as needed (post op pain)., Disp: 20 tablet, Rfl: 0   Vitamin D, Ergocalciferol, (DRISDOL) 1.25 MG (50000 UNIT) CAPS capsule, TAKE 1 CAPSULE WEEKLY, Disp: 12 capsule, Rfl: 3 No current facility-administered medications for this visit.  Facility-Administered Medications Ordered in Other Visits:    acetaminophen (TYLENOL) tablet 650 mg, 650 mg, Oral, Once, Kaikoa Magro, Rose Phi, MD   dexamethasone (DECADRON) 10 mg in sodium chloride 0.9 % 50 mL IVPB, 10 mg, Intravenous, Once, Taisei Bonnette, Rose Phi, MD   dextrose 5 % solution, , Intravenous, Once, Samone Guhl, Rose Phi, MD   diphenhydrAMINE (BENADRYL) capsule 50 mg, 50 mg, Oral, Once, Vonceil Upshur, Rose Phi, MD   fosaprepitant (EMEND) 150 mg in sodium chloride 0.9 % 145 mL IVPB, 150 mg, Intravenous, Once, Teigan Sahli, Rose Phi, MD    palonosetron (ALOXI) injection 0.25 mg, 0.25 mg, Intravenous, Once, Rolin Schult, Rose Phi, MD  Allergies:  Allergies  Allergen Reactions   Combigan [Brimonidine Tartrate-Timolol] Itching    Per patient made eyes red, sore, and sensitivity to light   Sulfa Antibiotics Hives and Rash   Sulfamethoxazole-Trimethoprim Hives    Past Medical History, Surgical history, Social history, and Family History were reviewed and updated.  Review of Systems: Review of Systems  Constitutional:  Positive for fatigue. Negative for appetite change.  HENT:  Negative.    Eyes: Negative.   Respiratory:  Positive for shortness of breath. Negative for chest tightness.   Cardiovascular: Negative.   Gastrointestinal:  Negative.   Endocrine: Negative.   Genitourinary: Negative.    Musculoskeletal:  Positive for gait problem.  Skin: Negative.   Neurological:  Positive for dizziness and gait problem. Negative for headaches and light-headedness.  Hematological: Negative.   Psychiatric/Behavioral: Negative.      Physical Exam:  height is 5' (1.524 m) and weight is 140 lb (63.5 kg). Her oral temperature is 98.1 F (36.7 C). Her blood pressure is 134/92 (abnormal) and her pulse is 99. Her respiration is 18 and oxygen saturation is 100%.   Wt Readings from Last 3 Encounters:  09/07/22 140 lb (63.5 kg)  08/25/22 147 lb (66.7 kg)  08/24/22 146 lb (66.2 kg)    Physical Exam Vitals reviewed.  HENT:     Head: Normocephalic and atraumatic.  Eyes:     Pupils: Pupils are equal, round, and reactive to light.  Cardiovascular:     Rate and Rhythm: Normal rate and regular rhythm.     Heart sounds: Normal heart sounds.  Pulmonary:     Effort: Pulmonary effort is normal.     Breath sounds: Normal breath sounds.  Abdominal:     General: Bowel sounds are normal.     Palpations: Abdomen is soft.  Musculoskeletal:        General: No tenderness or deformity. Normal range of motion.     Cervical back: Normal range of  motion.     Left lower leg: Edema present.     Comments: There is 2+ edema of the left leg throughout. The leg is mildly cooler to touch than the right. 1+ DP of the left leg. No cyanosis of skin. No active bleeding.   Lymphadenopathy:     Cervical: No cervical adenopathy.  Skin:    General: Skin is warm and dry.     Findings: No erythema or rash.  Neurological:     Mental Status: She is alert and oriented to person, place, and time.  Psychiatric:        Behavior: Behavior normal.        Thought Content: Thought content normal.        Judgment: Judgment normal.     Lab Results  Component Value Date   WBC 7.8 09/07/2022   HGB 12.7 09/07/2022   HCT 39.7 09/07/2022   MCV 102.8 (H) 09/07/2022   PLT 271 09/07/2022     Chemistry      Component Value Date/Time   NA 141 09/07/2022 1205   NA 141 08/24/2022 1002   NA 141 09/22/2016 1459   K 4.1 09/07/2022 1205   K 3.8 09/22/2016 1459   CL 106 09/07/2022 1205   CO2 26 09/07/2022 1205   CO2 26 09/22/2016 1459   BUN 27 (H) 09/07/2022 1205   BUN 18 08/24/2022 1002   BUN 19.8 09/22/2016 1459   CREATININE 0.87 09/07/2022 1205   CREATININE 1.1 09/22/2016 1459      Component Value Date/Time   CALCIUM 9.9 09/07/2022 1205   CALCIUM 9.2 09/22/2016 1459   ALKPHOS 106 09/07/2022 1205   ALKPHOS 98 09/22/2016 1459   AST 23 09/07/2022 1205   AST 15 09/22/2016 1459   ALT 19 09/07/2022 1205   ALT 21 09/22/2016 1459   BILITOT 0.5 09/07/2022 1205   BILITOT 0.65 09/22/2016 1459      Impression and Plan: Ms. Pietropaolo is a very nice 82 year old postmenopausal white female.  She has a solitary metastatic focus from breast cancer.  Her initial breast cancer was probably  7 years ago.  It was estrogen positive. 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  The HER2 was equivocal on initial staining but on FISH was negative.  I still think that Enhertu is a reasonable option for her.  We will have to follow the CA 27.29 and see how this  trends.  She does need to have Xgeva today.  I would like to get her back in 3 weeks.  After her third cycle of Enhertu, we will then plan for a follow-up PET scan.  I am just glad that the atrial fibrillation seems to be under better control.     Josph Macho, MD 7/8/202412:40 PM

## 2022-09-07 NOTE — Progress Notes (Signed)
Dose of Enhertu decreased to 350 mg to reflect patient's current weight per Dr. Gustavo Lah instructions.

## 2022-09-07 NOTE — Telephone Encounter (Signed)
Rx filled per last office visit note. 

## 2022-09-08 ENCOUNTER — Encounter: Payer: Self-pay | Admitting: Hematology & Oncology

## 2022-09-08 ENCOUNTER — Encounter: Payer: Self-pay | Admitting: *Deleted

## 2022-09-08 LAB — CANCER ANTIGEN 27.29: CA 27.29: 45.7 U/mL — ABNORMAL HIGH (ref 0.0–38.6)

## 2022-09-08 NOTE — Progress Notes (Signed)
Patient received her second cycle. She will need a PET after her next cycle of treatment.   Oncology Nurse Navigator Documentation     09/08/2022   12:45 PM  Oncology Nurse Navigator Flowsheets  Navigator Follow Up Date: 09/28/2022  Navigator Follow Up Reason: Follow-up Appointment;Chemotherapy  Navigator Location [facility]  Navigator Encounter Type Appt/Treatment Plan Review  Patient Visit Type MedOnc  Treatment Phase Active Tx  Barriers/Navigation Needs Coordination of Care  Interventions None Required  Acuity Level 1-No Barriers  Support Groups/Services Friends and Family  Time Spent with Patient 15

## 2022-09-10 ENCOUNTER — Ambulatory Visit (HOSPITAL_COMMUNITY)
Admission: RE | Admit: 2022-09-10 | Discharge: 2022-09-10 | Disposition: A | Discharge status: Home / Self Care | Payer: Medicare Other | Source: Ambulatory Visit | Attending: Neurology | Admitting: Neurology

## 2022-09-10 DIAGNOSIS — Z8673 Personal history of transient ischemic attack (TIA), and cerebral infarction without residual deficits: Secondary | ICD-10-CM | POA: Diagnosis not present

## 2022-09-14 DIAGNOSIS — I272 Pulmonary hypertension, unspecified: Secondary | ICD-10-CM | POA: Diagnosis not present

## 2022-09-14 DIAGNOSIS — I5033 Acute on chronic diastolic (congestive) heart failure: Secondary | ICD-10-CM | POA: Diagnosis not present

## 2022-09-14 DIAGNOSIS — I4819 Other persistent atrial fibrillation: Secondary | ICD-10-CM | POA: Diagnosis not present

## 2022-09-14 DIAGNOSIS — I130 Hypertensive heart and chronic kidney disease with heart failure and stage 1 through stage 4 chronic kidney disease, or unspecified chronic kidney disease: Secondary | ICD-10-CM | POA: Diagnosis not present

## 2022-09-14 DIAGNOSIS — C50412 Malignant neoplasm of upper-outer quadrant of left female breast: Secondary | ICD-10-CM | POA: Diagnosis not present

## 2022-09-14 DIAGNOSIS — N183 Chronic kidney disease, stage 3 unspecified: Secondary | ICD-10-CM | POA: Diagnosis not present

## 2022-09-15 ENCOUNTER — Other Ambulatory Visit: Payer: Self-pay | Admitting: Adult Health

## 2022-09-15 ENCOUNTER — Encounter: Payer: Self-pay | Admitting: Hematology & Oncology

## 2022-09-15 DIAGNOSIS — G3184 Mild cognitive impairment, so stated: Secondary | ICD-10-CM

## 2022-09-16 DIAGNOSIS — M1990 Unspecified osteoarthritis, unspecified site: Secondary | ICD-10-CM | POA: Diagnosis not present

## 2022-09-16 DIAGNOSIS — Z7951 Long term (current) use of inhaled steroids: Secondary | ICD-10-CM | POA: Diagnosis not present

## 2022-09-16 DIAGNOSIS — Z87891 Personal history of nicotine dependence: Secondary | ICD-10-CM | POA: Diagnosis not present

## 2022-09-16 DIAGNOSIS — Z604 Social exclusion and rejection: Secondary | ICD-10-CM | POA: Diagnosis not present

## 2022-09-16 DIAGNOSIS — Z7901 Long term (current) use of anticoagulants: Secondary | ICD-10-CM | POA: Diagnosis not present

## 2022-09-16 DIAGNOSIS — E039 Hypothyroidism, unspecified: Secondary | ICD-10-CM | POA: Diagnosis not present

## 2022-09-16 DIAGNOSIS — J189 Pneumonia, unspecified organism: Secondary | ICD-10-CM | POA: Diagnosis not present

## 2022-09-16 DIAGNOSIS — N183 Chronic kidney disease, stage 3 unspecified: Secondary | ICD-10-CM | POA: Diagnosis not present

## 2022-09-16 DIAGNOSIS — F0280 Dementia in other diseases classified elsewhere without behavioral disturbance: Secondary | ICD-10-CM | POA: Diagnosis not present

## 2022-09-16 DIAGNOSIS — E785 Hyperlipidemia, unspecified: Secondary | ICD-10-CM | POA: Diagnosis not present

## 2022-09-16 DIAGNOSIS — Z556 Problems related to health literacy: Secondary | ICD-10-CM | POA: Diagnosis not present

## 2022-09-16 DIAGNOSIS — M8580 Other specified disorders of bone density and structure, unspecified site: Secondary | ICD-10-CM | POA: Diagnosis not present

## 2022-09-16 DIAGNOSIS — I5033 Acute on chronic diastolic (congestive) heart failure: Secondary | ICD-10-CM | POA: Diagnosis not present

## 2022-09-16 DIAGNOSIS — Z8673 Personal history of transient ischemic attack (TIA), and cerebral infarction without residual deficits: Secondary | ICD-10-CM | POA: Diagnosis not present

## 2022-09-16 DIAGNOSIS — I130 Hypertensive heart and chronic kidney disease with heart failure and stage 1 through stage 4 chronic kidney disease, or unspecified chronic kidney disease: Secondary | ICD-10-CM | POA: Diagnosis not present

## 2022-09-16 DIAGNOSIS — Z9181 History of falling: Secondary | ICD-10-CM | POA: Diagnosis not present

## 2022-09-16 DIAGNOSIS — J45991 Cough variant asthma: Secondary | ICD-10-CM | POA: Diagnosis not present

## 2022-09-16 DIAGNOSIS — I080 Rheumatic disorders of both mitral and aortic valves: Secondary | ICD-10-CM | POA: Diagnosis not present

## 2022-09-16 DIAGNOSIS — Z85118 Personal history of other malignant neoplasm of bronchus and lung: Secondary | ICD-10-CM | POA: Diagnosis not present

## 2022-09-16 DIAGNOSIS — I4819 Other persistent atrial fibrillation: Secondary | ICD-10-CM | POA: Diagnosis not present

## 2022-09-16 DIAGNOSIS — I272 Pulmonary hypertension, unspecified: Secondary | ICD-10-CM | POA: Diagnosis not present

## 2022-09-16 DIAGNOSIS — C50412 Malignant neoplasm of upper-outer quadrant of left female breast: Secondary | ICD-10-CM | POA: Diagnosis not present

## 2022-09-16 DIAGNOSIS — S72002D Fracture of unspecified part of neck of left femur, subsequent encounter for closed fracture with routine healing: Secondary | ICD-10-CM | POA: Diagnosis not present

## 2022-09-16 DIAGNOSIS — G40909 Epilepsy, unspecified, not intractable, without status epilepticus: Secondary | ICD-10-CM | POA: Diagnosis not present

## 2022-09-17 DIAGNOSIS — C50412 Malignant neoplasm of upper-outer quadrant of left female breast: Secondary | ICD-10-CM | POA: Diagnosis not present

## 2022-09-17 DIAGNOSIS — I130 Hypertensive heart and chronic kidney disease with heart failure and stage 1 through stage 4 chronic kidney disease, or unspecified chronic kidney disease: Secondary | ICD-10-CM | POA: Diagnosis not present

## 2022-09-17 DIAGNOSIS — N183 Chronic kidney disease, stage 3 unspecified: Secondary | ICD-10-CM | POA: Diagnosis not present

## 2022-09-17 DIAGNOSIS — I5033 Acute on chronic diastolic (congestive) heart failure: Secondary | ICD-10-CM | POA: Diagnosis not present

## 2022-09-17 DIAGNOSIS — I272 Pulmonary hypertension, unspecified: Secondary | ICD-10-CM | POA: Diagnosis not present

## 2022-09-17 DIAGNOSIS — I4819 Other persistent atrial fibrillation: Secondary | ICD-10-CM | POA: Diagnosis not present

## 2022-09-18 DIAGNOSIS — Z9889 Other specified postprocedural states: Secondary | ICD-10-CM | POA: Diagnosis not present

## 2022-09-18 NOTE — Progress Notes (Signed)
Kindly inform the patient that carotid ultrasound study shows no significant narrowing of either carotid arteries in the neck

## 2022-09-21 ENCOUNTER — Telehealth: Payer: Self-pay

## 2022-09-21 NOTE — Telephone Encounter (Signed)
Called and spoke with patient husband and informed him per Dr. Pearlean Brownie "Kindly inform the patient that carotid ultrasound study shows no significant narrowing of either carotid arteries in the neck " Pt husband verbalized understanding. Pt had no questions at this time but was encouraged to call back if questions arise. Patient husband would like for [name] to sent results to her Dr. Myna Hidalgo.

## 2022-09-21 NOTE — Telephone Encounter (Signed)
-----   Message from Delia Heady sent at 09/18/2022  5:07 PM EDT ----- Joneen Roach inform the patient that carotid ultrasound study shows no significant narrowing of either carotid arteries in the neck

## 2022-09-25 DIAGNOSIS — I4819 Other persistent atrial fibrillation: Secondary | ICD-10-CM | POA: Diagnosis not present

## 2022-09-25 DIAGNOSIS — C50412 Malignant neoplasm of upper-outer quadrant of left female breast: Secondary | ICD-10-CM | POA: Diagnosis not present

## 2022-09-25 DIAGNOSIS — I272 Pulmonary hypertension, unspecified: Secondary | ICD-10-CM | POA: Diagnosis not present

## 2022-09-25 DIAGNOSIS — N183 Chronic kidney disease, stage 3 unspecified: Secondary | ICD-10-CM | POA: Diagnosis not present

## 2022-09-25 DIAGNOSIS — I130 Hypertensive heart and chronic kidney disease with heart failure and stage 1 through stage 4 chronic kidney disease, or unspecified chronic kidney disease: Secondary | ICD-10-CM | POA: Diagnosis not present

## 2022-09-25 DIAGNOSIS — I5033 Acute on chronic diastolic (congestive) heart failure: Secondary | ICD-10-CM | POA: Diagnosis not present

## 2022-09-28 ENCOUNTER — Inpatient Hospital Stay (HOSPITAL_BASED_OUTPATIENT_CLINIC_OR_DEPARTMENT_OTHER): Payer: Medicare Other | Admitting: Hematology & Oncology

## 2022-09-28 ENCOUNTER — Inpatient Hospital Stay: Payer: Medicare Other

## 2022-09-28 ENCOUNTER — Encounter: Payer: Self-pay | Admitting: Hematology & Oncology

## 2022-09-28 ENCOUNTER — Other Ambulatory Visit: Payer: Self-pay

## 2022-09-28 VITALS — BP 113/86 | HR 86 | Temp 97.5°F | Resp 16 | Wt 140.0 lb

## 2022-09-28 DIAGNOSIS — Z171 Estrogen receptor negative status [ER-]: Secondary | ICD-10-CM | POA: Diagnosis not present

## 2022-09-28 DIAGNOSIS — Z170 Estrogen receptor positive status [ER+]: Secondary | ICD-10-CM | POA: Diagnosis not present

## 2022-09-28 DIAGNOSIS — C7951 Secondary malignant neoplasm of bone: Secondary | ICD-10-CM | POA: Diagnosis not present

## 2022-09-28 DIAGNOSIS — C50412 Malignant neoplasm of upper-outer quadrant of left female breast: Secondary | ICD-10-CM

## 2022-09-28 DIAGNOSIS — C50812 Malignant neoplasm of overlapping sites of left female breast: Secondary | ICD-10-CM | POA: Diagnosis not present

## 2022-09-28 DIAGNOSIS — Z5112 Encounter for antineoplastic immunotherapy: Secondary | ICD-10-CM | POA: Diagnosis not present

## 2022-09-28 LAB — CMP (CANCER CENTER ONLY)
ALT: 20 U/L (ref 0–44)
AST: 20 U/L (ref 15–41)
Albumin: 3.9 g/dL (ref 3.5–5.0)
Alkaline Phosphatase: 98 U/L (ref 38–126)
Anion gap: 10 (ref 5–15)
BUN: 21 mg/dL (ref 8–23)
CO2: 24 mmol/L (ref 22–32)
Calcium: 9 mg/dL (ref 8.9–10.3)
Chloride: 105 mmol/L (ref 98–111)
Creatinine: 0.8 mg/dL (ref 0.44–1.00)
GFR, Estimated: >60 mL/min (ref >60)
Glucose, Bld: 128 mg/dL — ABNORMAL HIGH (ref 70–99)
Potassium: 4.1 mmol/L (ref 3.5–5.1)
Sodium: 139 mmol/L (ref 135–145)
Total Bilirubin: 0.5 mg/dL (ref 0.3–1.2)
Total Protein: 6.4 g/dL — ABNORMAL LOW (ref 6.5–8.1)

## 2022-09-28 LAB — CBC WITH DIFFERENTIAL (CANCER CENTER ONLY)
Abs Immature Granulocytes: 0.1 10*3/uL — ABNORMAL HIGH (ref 0.00–0.07)
Basophils Absolute: 0 10*3/uL (ref 0.0–0.1)
Basophils Relative: 1 %
Eosinophils Absolute: 0 10*3/uL (ref 0.0–0.5)
Eosinophils Relative: 0 %
HCT: 38.2 % (ref 36.0–46.0)
Hemoglobin: 12.4 g/dL (ref 12.0–15.0)
Immature Granulocytes: 2 %
Lymphocytes Relative: 28 %
Lymphs Abs: 1.2 10*3/uL (ref 0.7–4.0)
MCH: 32.8 pg (ref 26.0–34.0)
MCHC: 32.5 g/dL (ref 30.0–36.0)
MCV: 101.1 fL — ABNORMAL HIGH (ref 80.0–100.0)
Monocytes Absolute: 0.6 10*3/uL (ref 0.1–1.0)
Monocytes Relative: 13 %
Neutro Abs: 2.5 10*3/uL (ref 1.7–7.7)
Neutrophils Relative %: 56 %
Platelet Count: 291 10*3/uL (ref 150–400)
RBC: 3.78 MIL/uL — ABNORMAL LOW (ref 3.87–5.11)
RDW: 16.5 % — ABNORMAL HIGH (ref 11.5–15.5)
WBC Count: 4.4 10*3/uL (ref 4.0–10.5)
nRBC: 0.5 % — ABNORMAL HIGH (ref 0.0–0.2)

## 2022-09-28 LAB — LACTATE DEHYDROGENASE: LDH: 262 U/L — ABNORMAL HIGH (ref 98–192)

## 2022-09-28 MED ORDER — ACETAMINOPHEN 325 MG PO TABS
650.0000 mg | ORAL_TABLET | Freq: Once | ORAL | Status: AC
  Administered 2022-09-28: 650 mg via ORAL
  Filled 2022-09-28: qty 2

## 2022-09-28 MED ORDER — DEXTROSE 5 % IV SOLN: Freq: Once | INTRAVENOUS | Status: AC

## 2022-09-28 MED ORDER — FAM-TRASTUZUMAB DERUXTECAN-NXKI CHEMO 100 MG IV SOLR
350.0000 mg | Freq: Once | INTRAVENOUS | Status: AC
  Administered 2022-09-28: 360 mg via INTRAVENOUS
  Filled 2022-09-28: qty 18

## 2022-09-28 MED ORDER — PALONOSETRON HCL INJECTION 0.25 MG/5ML
0.2500 mg | Freq: Once | INTRAVENOUS | Status: AC
  Administered 2022-09-28: 0.25 mg via INTRAVENOUS
  Filled 2022-09-28: qty 5

## 2022-09-28 MED ORDER — SODIUM CHLORIDE 0.9 % IV SOLN
150.0000 mg | Freq: Once | INTRAVENOUS | Status: AC
  Administered 2022-09-28: 150 mg via INTRAVENOUS
  Filled 2022-09-28: qty 150

## 2022-09-28 MED ORDER — DIPHENHYDRAMINE HCL 25 MG PO CAPS
50.0000 mg | ORAL_CAPSULE | Freq: Once | ORAL | Status: AC
  Administered 2022-09-28: 50 mg via ORAL
  Filled 2022-09-28: qty 2

## 2022-09-28 MED ORDER — SODIUM CHLORIDE 0.9 % IV SOLN
10.0000 mg | Freq: Once | INTRAVENOUS | Status: AC
  Administered 2022-09-28: 10 mg via INTRAVENOUS
  Filled 2022-09-28: qty 10

## 2022-09-28 NOTE — Patient Instructions (Addendum)
[place] [facility]  Discharge Instructions: Thank you for choosing [facility] to provide your oncology and hematology care.   If you have a lab appointment with the Cancer Center, please go directly to the Cancer Center and check in at the registration area.  Wear comfortable clothing and clothing appropriate for easy access to any Portacath or PICC line.   We strive to give you quality time with your provider. You may need to reschedule your appointment if you arrive late (15 or more minutes).  Arriving late affects you and other patients whose appointments are after yours.  Also, if you miss three or more appointments without notifying the office, you may be dismissed from the clinic at the provider's discretion.      For prescription refill requests, have your pharmacy contact our office and allow 72 hours for refills to be completed.    Today you received the following chemotherapy and/or immunotherapy agents Enhertu      To help prevent nausea and vomiting after your treatment, we encourage you to take your nausea medication as directed.  BELOW ARE SYMPTOMS THAT SHOULD BE REPORTED IMMEDIATELY: *FEVER GREATER THAN 100.4 F (38 C) OR HIGHER *CHILLS OR SWEATING *NAUSEA AND VOMITING THAT IS NOT CONTROLLED WITH YOUR NAUSEA MEDICATION *UNUSUAL SHORTNESS OF BREATH *UNUSUAL BRUISING OR BLEEDING *URINARY PROBLEMS (pain or burning when urinating, or frequent urination) *BOWEL PROBLEMS (unusual diarrhea, constipation, pain near the anus) TENDERNESS IN MOUTH AND THROAT WITH OR WITHOUT PRESENCE OF ULCERS (sore throat, sores in mouth, or a toothache) UNUSUAL RASH, SWELLING OR PAIN  UNUSUAL VAGINAL DISCHARGE OR ITCHING   Items with * indicate a potential emergency and should be followed up as soon as possible or go to the Emergency Department if any problems should occur.  Please show the CHEMOTHERAPY ALERT CARD or IMMUNOTHERAPY ALERT CARD at check-in  to the Emergency Department and triage nurse. Should you have questions after your visit or need to cancel or reschedule your appointment, please contact [place] [facility] [place]  [phone] and follow the prompts.  Office hours are 8:00 a.m. to 4:30 p.m. Monday - Friday. Please note that voicemails left after 4:00 p.m. may not be returned until the following business day.  We are closed weekends and major holidays. You have access to a nurse at all times for urgent questions. Please call the main number to the clinic [phone] and follow the prompts.  For any non-urgent questions, you may also contact your provider using MyChart. We now offer e-Visits for anyone 26 and older to request care online for non-urgent symptoms. For details visit [email or web address].   Also download the MyChart app! Go to the app store, search "MyChart", open the app, select [place], and log in with your MyChart username and password. [place] [facility]  Discharge Instructions: Thank you for choosing [facility] to provide your oncology and hematology care.   If you have a lab appointment with the Cancer Center, please go directly to the Cancer Center and check in at the registration area.  Wear comfortable clothing and clothing appropriate for easy access to any Portacath or PICC line.   We strive to give you quality time with your provider. You may need to reschedule your appointment if you arrive late (15 or more minutes).  Arriving late affects you and other patients whose appointments are after yours.  Also, if you miss  three or more appointments without notifying the office, you may be dismissed from the clinic at the provider's discretion.      For prescription refill requests, have your pharmacy contact our office and allow 72 hours for refills to be completed.    Today you received the following chemotherapy and/or immunotherapy  agents enhertu      To help prevent nausea and vomiting after your treatment, we encourage you to take your nausea medication as directed.  BELOW ARE SYMPTOMS THAT SHOULD BE REPORTED IMMEDIATELY: *FEVER GREATER THAN 100.4 F (38 C) OR HIGHER *CHILLS OR SWEATING *NAUSEA AND VOMITING THAT IS NOT CONTROLLED WITH YOUR NAUSEA MEDICATION *UNUSUAL SHORTNESS OF BREATH *UNUSUAL BRUISING OR BLEEDING *URINARY PROBLEMS (pain or burning when urinating, or frequent urination) *BOWEL PROBLEMS (unusual diarrhea, constipation, pain near the anus) TENDERNESS IN MOUTH AND THROAT WITH OR WITHOUT PRESENCE OF ULCERS (sore throat, sores in mouth, or a toothache) UNUSUAL RASH, SWELLING OR PAIN  UNUSUAL VAGINAL DISCHARGE OR ITCHING   Items with * indicate a potential emergency and should be followed up as soon as possible or go to the Emergency Department if any problems should occur.  Please show the CHEMOTHERAPY ALERT CARD or IMMUNOTHERAPY ALERT CARD at check-in to the Emergency Department and triage nurse. Should you have questions after your visit or need to cancel or reschedule your appointment, please contact [place] [facility] [place]  [phone] and follow the prompts.  Office hours are 8:00 a.m. to 4:30 p.m. Monday - Friday. Please note that voicemails left after 4:00 p.m. may not be returned until the following business day.  We are closed weekends and major holidays. You have access to a nurse at all times for urgent questions. Please call the main number to the clinic [phone] and follow the prompts.  For any non-urgent questions, you may also contact your provider using MyChart. We now offer e-Visits for anyone 47 and older to request care online for non-urgent symptoms. For details visit [email or web address].   Also download the MyChart app! Go to the app store, search "MyChart", open the app, select [place], and log in with your MyChart username and password.

## 2022-09-28 NOTE — Progress Notes (Signed)
Hematology and Oncology Follow Up Visit  Patricia [name] [id number] 1940/12/04 82 y.o. 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 Sieker is back for follow-up.  She is doing okay.  She really has had no problems with nausea or vomiting.  She is having no issues with pain.  She is tolerated the Enhertu okay.  Her last CA 27.29 was up a little bit at 46.  We will have to watch this closely.  She still get around with a cane.  She had pinning of the left hip.  She seems to be improving with this.  The atrial fibrillation is still present.  She is on Eliquis for this.  She has had no bleeding.  There is no change in bowel or bladder habits.  She has had no leg swelling.  She has had no headache.  Overall, I would have to say that her performance status is probably ECOG 1.   Wt Readings from Last 3 Encounters:  09/28/22 140 lb (63.5 kg)  09/07/22 140 lb (63.5 kg)  08/25/22 147 lb (66.7 kg)     Medications:  Current Outpatient Medications:    albuterol (PROVENTIL HFA;VENTOLIN HFA) 108 (90 Base) MCG/ACT inhaler, Inhale 2 puffs into the lungs every 6 (six) hours as needed for wheezing or shortness of breath., Disp: , Rfl:    apixaban (ELIQUIS) 5 MG TABS tablet, Take 1 tablet (5 mg total) by mouth 2 (two) times daily., Disp: 180 tablet, Rfl: 3   Apoaequorin (PREVAGEN) 10 MG CAPS, Take 1 capsule by mouth every morning., Disp: 90 capsule, Rfl: 3   atorvastatin (LIPITOR) 40 MG tablet, Take 1 tablet (40 mg total) by mouth daily before supper., Disp: 90 tablet, Rfl: 3   bimatoprost (LUMIGAN) 0.01 % SOLN, Place 1 drop into both eyes 2 (two) times  daily., Disp: , Rfl:    calcium-vitamin D (OSCAL WITH D) 500-200 MG-UNIT tablet, Take 1 tablet by mouth daily with breakfast., Disp: 30 tablet, Rfl: 0   dexamethasone (DECADRON) 4 MG tablet, Take 2 tablets (8 mg) by mouth daily for 3 days starting the day after chemotherapy. Take with food., Disp: 30 tablet, Rfl: 1   digoxin (LANOXIN) 0.125 MG tablet, Take 1 tablet (0.125 mg total) by mouth daily., Disp: 90 tablet, Rfl: 3   dorzolamide-timolol (COSOPT) 22.3-6.8 MG/ML ophthalmic solution, Place 1 drop into both eyes at bedtime., Disp: , Rfl:    fluticasone (FLONASE) 50 MCG/ACT nasal spray, 1 spray each nostril following sinus rinses twice daily, Disp: 16 g, Rfl: 2   fluticasone-salmeterol (ADVAIR HFA) 230-21 MCG/ACT inhaler, USE 2 INHALATIONS TWICE A DAY, Disp: 36 g, Rfl: 3   folic acid (FOLVITE) 400 MCG tablet, Take 400 mcg by mouth daily., Disp: , Rfl:    furosemide (LASIX) 20 MG tablet, Take 1 tablet (20 mg total) by mouth 2 (two) times daily. Take additional 20 mg as needed for swelling, Disp: 180 tablet, Rfl: 2   gabapentin (NEURONTIN) 400 MG capsule, TAKE 1 CAPSULE TWICE A DAY, Disp: 180 capsule, Rfl: 3   L-Methylfolate-B12-B6-B2 (CEREFOLIN) 07-31-48-5 MG TABS, Take 1 tablet by mouth every morning., Disp: 90 tablet, Rfl: 3  levETIRAcetam (KEPPRA XR) 500 MG 24 hr tablet, TAKE 1 TABLET AT BEDTIME, Disp: 90 tablet, Rfl: 3   loratadine (CLARITIN) 10 MG tablet, Take 10 mg by mouth daily., Disp: , Rfl:    memantine (NAMENDA) 10 MG tablet, TAKE 1 TABLET TWICE A DAY, Disp: 180 tablet, Rfl: 3   metoprolol tartrate (LOPRESSOR) 50 MG tablet, Take 1 tablet (50 mg total) by mouth 2 (two) times daily., Disp: 180 tablet, Rfl: 3   Multiple Vitamins-Iron (MULTIVITAMIN/IRON) TABS, Take 1 tablet by mouth daily., Disp: , Rfl:    Netarsudil Dimesylate (RHOPRESSA) 0.02 % SOLN, Place 1 drop into both eyes at bedtime., Disp: , Rfl:    ondansetron (ZOFRAN) 8 MG tablet, Take 1 tablet (8 mg total) by mouth every 8 (eight)  hours as needed for nausea or vomiting. Start on the third day after chemotherapy., Disp: 30 tablet, Rfl: 1   OVER THE COUNTER MEDICATION, Take 1 tablet by mouth daily. Zyflamend otc herbal supplement, Disp: , Rfl:    pantoprazole (PROTONIX) 40 MG tablet, TAKE 1 TABLET DAILY, Disp: 90 tablet, Rfl: 3   potassium chloride SA (KLOR-CON M) 20 MEQ tablet, TAKE 1 TABLET TWICE A DAY, Disp: 180 tablet, Rfl: 3   prochlorperazine (COMPAZINE) 10 MG tablet, Take 1 tablet (10 mg total) by mouth every 6 (six) hours as needed for nausea or vomiting., Disp: 30 tablet, Rfl: 1   thyroid (ARMOUR THYROID) 15 MG tablet, Take 1 tablet (15 mg total) by mouth daily. Take with 60mg  for a total dose of 75mg  daily., Disp: , Rfl:    thyroid (ARMOUR THYROID) 60 MG tablet, Take 1 tablet (60 mg total) by mouth daily. Takes with 15mg  daily., Disp: , Rfl:    traMADol (ULTRAM) 50 MG tablet, Take 1 tablet (50 mg total) by mouth every 6 (six) hours as needed (post op pain)., Disp: 20 tablet, Rfl: 0   Vitamin D, Ergocalciferol, (DRISDOL) 1.25 MG (50000 UNIT) CAPS capsule, TAKE 1 CAPSULE WEEKLY, Disp: 12 capsule, Rfl: 3 No current facility-administered medications for this visit.  Facility-Administered Medications Ordered in Other Visits:    acetaminophen (TYLENOL) tablet 650 mg, 650 mg, Oral, Once, Wasil Wolke, Rose Phi, MD   dexamethasone (DECADRON) 10 mg in sodium chloride 0.9 % 50 mL IVPB, 10 mg, Intravenous, Once, Tandi Hanko, Rose Phi, MD   dextrose 5 % solution, , Intravenous, Once, Lakeasha Petion, Rose Phi, MD   diphenhydrAMINE (BENADRYL) capsule 50 mg, 50 mg, Oral, Once, Dody Smartt, Rose Phi, MD   fosaprepitant (EMEND) 150 mg in sodium chloride 0.9 % 145 mL IVPB, 150 mg, Intravenous, Once, Demani Mcbrien, Rose Phi, MD   palonosetron (ALOXI) injection 0.25 mg, 0.25 mg, Intravenous, Once, Kastin Cerda, Rose Phi, MD  Allergies:  Allergies  Allergen Reactions   Combigan [Brimonidine Tartrate-Timolol] Itching    Per patient made eyes red, sore, and sensitivity  to light   Sulfa Antibiotics Hives and Rash   Sulfamethoxazole-Trimethoprim Hives    Past Medical History, Surgical history, Social history, and Family History were reviewed and updated.  Review of Systems: Review of Systems  Constitutional:  Positive for fatigue. Negative for appetite change.  HENT:  Negative.    Eyes: Negative.   Respiratory:  Positive for shortness of breath. Negative for chest tightness.   Cardiovascular: Negative.   Gastrointestinal: Negative.   Endocrine: Negative.   Genitourinary: Negative.    Musculoskeletal:  Positive for gait problem.  Skin: Negative.   Neurological:  Positive for dizziness and gait problem. Negative for headaches and light-headedness.  Hematological: Negative.   Psychiatric/Behavioral: Negative.      Physical Exam:  weight is 140 lb (63.5 kg). Her oral temperature is 97.5 F (36.4 C) (abnormal). Her blood pressure is 113/86 and her pulse is 86. Her respiration is 16 and oxygen saturation is 93%.   Wt Readings from Last 3 Encounters:  09/28/22 140 lb (63.5 kg)  09/07/22 140 lb (63.5 kg)  08/25/22 147 lb (66.7 kg)    Physical Exam Vitals reviewed.  HENT:     Head: Normocephalic and atraumatic.  Eyes:     Pupils: Pupils are equal, round, and reactive to light.  Cardiovascular:     Rate and Rhythm: Normal rate and regular rhythm.     Heart sounds: Normal heart sounds.  Pulmonary:     Effort: Pulmonary effort is normal.     Breath sounds: Normal breath sounds.  Abdominal:     General: Bowel sounds are normal.     Palpations: Abdomen is soft.  Musculoskeletal:        General: No tenderness or deformity. Normal range of motion.     Cervical back: Normal range of motion.     Left lower leg: Edema present.     Comments: There is 2+ edema of the left leg throughout. The leg is mildly cooler to touch than the right. 1+ DP of the left leg. No cyanosis of skin. No active bleeding.   Lymphadenopathy:     Cervical: No cervical  adenopathy.  Skin:    General: Skin is warm and dry.     Findings: No erythema or rash.  Neurological:     Mental Status: She is alert and oriented to person, place, and time.  Psychiatric:        Behavior: Behavior normal.        Thought Content: Thought content normal.        Judgment: Judgment normal.      Lab Results  Component Value Date   WBC 4.4 09/28/2022   HGB 12.4 09/28/2022   HCT 38.2 09/28/2022   MCV 101.1 (H) 09/28/2022   PLT 291 09/28/2022     Chemistry      Component Value Date/Time   NA 139 09/28/2022 1231   NA 141 08/24/2022 1002   NA 141 09/22/2016 1459   K 4.1 09/28/2022 1231   K 3.8 09/22/2016 1459   CL 105 09/28/2022 1231   CO2 24 09/28/2022 1231   CO2 26 09/22/2016 1459   BUN 21 09/28/2022 1231   BUN 18 08/24/2022 1002   BUN 19.8 09/22/2016 1459   CREATININE 0.80 09/28/2022 1231   CREATININE 1.1 09/22/2016 1459      Component Value Date/Time   CALCIUM 9.0 09/28/2022 1231   CALCIUM 9.2 09/22/2016 1459   ALKPHOS 98 09/28/2022 1231   ALKPHOS 98 09/22/2016 1459   AST 20 09/28/2022 1231   AST 15 09/22/2016 1459   ALT 20 09/28/2022 1231   ALT 21 09/22/2016 1459   BILITOT 0.5 09/28/2022 1231   BILITOT 0.65 09/22/2016 1459      Impression and Plan: Ms. Langer is a very nice 82 year old postmenopausal white female.  She has a solitary metastatic focus from breast cancer.  Her initial breast cancer was probably 7 years ago.  It was estrogen positive. 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  The HER2 was equivocal on initial staining but on FISH was negative.  I still think that Enhertu is a reasonable option for her.  We will  have to follow the CA 27.29 and see how this trends.  I will have to set her up with a follow-up PET scan after this cycle.  Hopefully, we will see that she is responding.  I will plan to get her back in 3 weeks.   Josph Macho, MD 7/29/20241:52 PM

## 2022-09-29 DIAGNOSIS — H26491 Other secondary cataract, right eye: Secondary | ICD-10-CM | POA: Diagnosis not present

## 2022-09-29 DIAGNOSIS — H04123 Dry eye syndrome of bilateral lacrimal glands: Secondary | ICD-10-CM | POA: Diagnosis not present

## 2022-09-29 DIAGNOSIS — H52203 Unspecified astigmatism, bilateral: Secondary | ICD-10-CM | POA: Diagnosis not present

## 2022-09-29 DIAGNOSIS — H524 Presbyopia: Secondary | ICD-10-CM | POA: Diagnosis not present

## 2022-09-29 DIAGNOSIS — H401133 Primary open-angle glaucoma, bilateral, severe stage: Secondary | ICD-10-CM | POA: Diagnosis not present

## 2022-09-29 DIAGNOSIS — H43813 Vitreous degeneration, bilateral: Secondary | ICD-10-CM | POA: Diagnosis not present

## 2022-10-02 DIAGNOSIS — I130 Hypertensive heart and chronic kidney disease with heart failure and stage 1 through stage 4 chronic kidney disease, or unspecified chronic kidney disease: Secondary | ICD-10-CM | POA: Diagnosis not present

## 2022-10-02 DIAGNOSIS — N183 Chronic kidney disease, stage 3 unspecified: Secondary | ICD-10-CM | POA: Diagnosis not present

## 2022-10-02 DIAGNOSIS — I5033 Acute on chronic diastolic (congestive) heart failure: Secondary | ICD-10-CM | POA: Diagnosis not present

## 2022-10-02 DIAGNOSIS — C50412 Malignant neoplasm of upper-outer quadrant of left female breast: Secondary | ICD-10-CM | POA: Diagnosis not present

## 2022-10-02 DIAGNOSIS — I272 Pulmonary hypertension, unspecified: Secondary | ICD-10-CM | POA: Diagnosis not present

## 2022-10-02 DIAGNOSIS — I4819 Other persistent atrial fibrillation: Secondary | ICD-10-CM | POA: Diagnosis not present

## 2022-10-05 ENCOUNTER — Telehealth: Payer: Self-pay | Admitting: *Deleted

## 2022-10-05 DIAGNOSIS — I5033 Acute on chronic diastolic (congestive) heart failure: Secondary | ICD-10-CM | POA: Diagnosis not present

## 2022-10-05 DIAGNOSIS — N183 Chronic kidney disease, stage 3 unspecified: Secondary | ICD-10-CM | POA: Diagnosis not present

## 2022-10-05 DIAGNOSIS — I272 Pulmonary hypertension, unspecified: Secondary | ICD-10-CM | POA: Diagnosis not present

## 2022-10-05 DIAGNOSIS — C50412 Malignant neoplasm of upper-outer quadrant of left female breast: Secondary | ICD-10-CM | POA: Diagnosis not present

## 2022-10-05 DIAGNOSIS — I130 Hypertensive heart and chronic kidney disease with heart failure and stage 1 through stage 4 chronic kidney disease, or unspecified chronic kidney disease: Secondary | ICD-10-CM | POA: Diagnosis not present

## 2022-10-05 DIAGNOSIS — I4819 Other persistent atrial fibrillation: Secondary | ICD-10-CM | POA: Diagnosis not present

## 2022-10-05 NOTE — Telephone Encounter (Signed)
[facility] Occupational Therapist calling to let provider know that he completed the OT and that there is no need for further OT services.  He said that they would continue PT services for balance issues and if you have any questions to call him at [phone].

## 2022-10-06 ENCOUNTER — Encounter: Payer: Self-pay | Admitting: *Deleted

## 2022-10-06 NOTE — Progress Notes (Signed)
Patient received cycle two of treatment and is now due for PET. Scheduled for 10/08/2022.  Oncology Nurse Navigator Documentation     10/06/2022    2:45 PM  Oncology Nurse Navigator Flowsheets  Navigator Follow Up Date: 10/08/2022  Navigator Follow Up Reason: Scan Review  Navigator Location [facility]  Navigator Encounter Type Appt/Treatment Plan Review  Patient Visit Type MedOnc  Treatment Phase Active Tx  Barriers/Navigation Needs No Barriers At This Time  Interventions None Required  Acuity Level 1-No Barriers  Support Groups/Services Friends and Family  Time Spent with Patient 15

## 2022-10-08 ENCOUNTER — Encounter (HOSPITAL_COMMUNITY): Admission: RE | Admit: 2022-10-08 | Payer: Medicare Other | Source: Ambulatory Visit

## 2022-10-09 ENCOUNTER — Encounter (HOSPITAL_COMMUNITY)
Admission: RE | Admit: 2022-10-09 | Discharge: 2022-10-09 | Disposition: A | Discharge status: Home / Self Care | Payer: Medicare Other | Source: Ambulatory Visit | Attending: Hematology & Oncology | Admitting: Hematology & Oncology

## 2022-10-09 DIAGNOSIS — C50412 Malignant neoplasm of upper-outer quadrant of left female breast: Secondary | ICD-10-CM | POA: Insufficient documentation

## 2022-10-09 DIAGNOSIS — C7951 Secondary malignant neoplasm of bone: Secondary | ICD-10-CM | POA: Diagnosis not present

## 2022-10-09 DIAGNOSIS — C50912 Malignant neoplasm of unspecified site of left female breast: Secondary | ICD-10-CM | POA: Diagnosis not present

## 2022-10-09 DIAGNOSIS — I5033 Acute on chronic diastolic (congestive) heart failure: Secondary | ICD-10-CM | POA: Diagnosis not present

## 2022-10-09 DIAGNOSIS — I4819 Other persistent atrial fibrillation: Secondary | ICD-10-CM | POA: Diagnosis not present

## 2022-10-09 DIAGNOSIS — I130 Hypertensive heart and chronic kidney disease with heart failure and stage 1 through stage 4 chronic kidney disease, or unspecified chronic kidney disease: Secondary | ICD-10-CM | POA: Diagnosis not present

## 2022-10-09 DIAGNOSIS — Z170 Estrogen receptor positive status [ER+]: Secondary | ICD-10-CM | POA: Insufficient documentation

## 2022-10-09 DIAGNOSIS — I272 Pulmonary hypertension, unspecified: Secondary | ICD-10-CM | POA: Diagnosis not present

## 2022-10-09 DIAGNOSIS — N183 Chronic kidney disease, stage 3 unspecified: Secondary | ICD-10-CM | POA: Diagnosis not present

## 2022-10-09 LAB — GLUCOSE, CAPILLARY: Glucose-Capillary: 102 mg/dL — ABNORMAL HIGH (ref 70–99)

## 2022-10-09 MED ORDER — FLUDEOXYGLUCOSE F - 18 (FDG) INJECTION
7.0100 | Freq: Once | INTRAVENOUS | Status: AC
  Administered 2022-10-09: 7.01 via INTRAVENOUS

## 2022-10-12 ENCOUNTER — Other Ambulatory Visit: Payer: Self-pay | Admitting: Hematology & Oncology

## 2022-10-12 ENCOUNTER — Telehealth: Payer: Self-pay | Admitting: Hematology & Oncology

## 2022-10-12 ENCOUNTER — Encounter: Payer: Self-pay | Admitting: *Deleted

## 2022-10-12 DIAGNOSIS — J90 Pleural effusion, not elsewhere classified: Secondary | ICD-10-CM

## 2022-10-12 NOTE — Telephone Encounter (Signed)
I called Patricia [name] about the PET scan that she had done.  What was in her bones looks much better.  However, there is a lot of pleural fluid around the left lung.  This I think needs to be taken off.  I will set up a thoracentesis for her.  I told her to stop the Eliquis right now so we will get the thoracentesis in a day or so.  I told her that if she has any questions, she can always call us.  I hate that she has this fluid.  However, I cannot let it sit there and then it can become a problem and then gel up in cause permanent issues.  Again, I told her to call us if she has a questions.   Christin Bach, MD

## 2022-10-12 NOTE — Progress Notes (Signed)
Reviewed PET which shows treatment response.   Oncology Nurse Navigator Documentation     10/12/2022    8:15 AM  Oncology Nurse Navigator Flowsheets  Navigator Follow Up Date: 10/19/2022  Navigator Follow Up Reason: Follow-up Appointment;Chemotherapy  Navigator Location [facility]  Navigator Encounter Type Scan Review  Patient Visit Type MedOnc  Treatment Phase Active Tx  Barriers/Navigation Needs No Barriers At This Time  Interventions None Required  Acuity Level 1-No Barriers  Support Groups/Services Friends and Family  Time Spent with Patient 15

## 2022-10-13 ENCOUNTER — Ambulatory Visit (HOSPITAL_COMMUNITY)
Admission: RE | Admit: 2022-10-13 | Discharge: 2022-10-13 | Disposition: A | Discharge status: Home / Self Care | Payer: Medicare Other | Source: Ambulatory Visit | Attending: Hematology & Oncology | Admitting: Hematology & Oncology

## 2022-10-13 ENCOUNTER — Ambulatory Visit (HOSPITAL_COMMUNITY)
Admission: RE | Admit: 2022-10-13 | Discharge: 2022-10-13 | Disposition: A | Discharge status: Home / Self Care | Payer: Medicare Other | Source: Ambulatory Visit | Attending: Radiology | Admitting: Radiology

## 2022-10-13 DIAGNOSIS — C459 Mesothelioma, unspecified: Secondary | ICD-10-CM | POA: Diagnosis not present

## 2022-10-13 DIAGNOSIS — Z48813 Encounter for surgical aftercare following surgery on the respiratory system: Secondary | ICD-10-CM | POA: Diagnosis not present

## 2022-10-13 DIAGNOSIS — J90 Pleural effusion, not elsewhere classified: Secondary | ICD-10-CM | POA: Diagnosis not present

## 2022-10-13 DIAGNOSIS — R0989 Other specified symptoms and signs involving the circulatory and respiratory systems: Secondary | ICD-10-CM | POA: Diagnosis not present

## 2022-10-13 MED ORDER — LIDOCAINE HCL 1 % IJ SOLN
INTRAMUSCULAR | Status: AC
  Filled 2022-10-13: qty 20

## 2022-10-13 NOTE — Procedures (Signed)
Ultrasound-guided diagnostic and therapeutic left thoracentesis performed yielding 450 cc of yellow  fluid. No immediate complications. Follow-up chest x-ray pending. The fluid was sent to the lab for cytology. EBL none.

## 2022-10-15 ENCOUNTER — Other Ambulatory Visit: Payer: Medicare Other

## 2022-10-15 DIAGNOSIS — E032 Hypothyroidism due to medicaments and other exogenous substances: Secondary | ICD-10-CM

## 2022-10-15 DIAGNOSIS — Z136 Encounter for screening for cardiovascular disorders: Secondary | ICD-10-CM | POA: Diagnosis not present

## 2022-10-17 LAB — LIPID PANEL
Chol/HDL Ratio: 3.2 ratio (ref 0.0–4.4)
Cholesterol, Total: 158 mg/dL (ref 100–199)
HDL: 49 mg/dL (ref >39)
LDL Chol Calc (NIH): 85 mg/dL (ref 0–99)
Triglycerides: 139 mg/dL (ref 0–149)
VLDL Cholesterol Cal: 24 mg/dL (ref 5–40)

## 2022-10-17 LAB — TSH: TSH: 20.5 u[IU]/mL — ABNORMAL HIGH (ref 0.450–4.500)

## 2022-10-19 ENCOUNTER — Inpatient Hospital Stay: Payer: Medicare Other | Attending: Hematology & Oncology

## 2022-10-19 ENCOUNTER — Encounter: Payer: Self-pay | Admitting: Hematology & Oncology

## 2022-10-19 ENCOUNTER — Inpatient Hospital Stay (HOSPITAL_BASED_OUTPATIENT_CLINIC_OR_DEPARTMENT_OTHER): Payer: Medicare Other | Admitting: Hematology & Oncology

## 2022-10-19 ENCOUNTER — Inpatient Hospital Stay: Payer: Medicare Other

## 2022-10-19 VITALS — BP 117/74 | HR 95 | Temp 97.9°F | Resp 18 | Ht 60.0 in | Wt 140.1 lb

## 2022-10-19 DIAGNOSIS — C7951 Secondary malignant neoplasm of bone: Secondary | ICD-10-CM | POA: Insufficient documentation

## 2022-10-19 DIAGNOSIS — C50412 Malignant neoplasm of upper-outer quadrant of left female breast: Secondary | ICD-10-CM

## 2022-10-19 DIAGNOSIS — C50912 Malignant neoplasm of unspecified site of left female breast: Secondary | ICD-10-CM | POA: Diagnosis not present

## 2022-10-19 DIAGNOSIS — Z5112 Encounter for antineoplastic immunotherapy: Secondary | ICD-10-CM | POA: Insufficient documentation

## 2022-10-19 DIAGNOSIS — Z170 Estrogen receptor positive status [ER+]: Secondary | ICD-10-CM | POA: Diagnosis not present

## 2022-10-19 DIAGNOSIS — Z171 Estrogen receptor negative status [ER-]: Secondary | ICD-10-CM | POA: Diagnosis not present

## 2022-10-19 LAB — CBC WITH DIFFERENTIAL (CANCER CENTER ONLY)
Abs Immature Granulocytes: 0.08 10*3/uL — ABNORMAL HIGH (ref 0.00–0.07)
Basophils Absolute: 0 10*3/uL (ref 0.0–0.1)
Basophils Relative: 1 %
Eosinophils Absolute: 0 10*3/uL (ref 0.0–0.5)
Eosinophils Relative: 0 %
HCT: 38.9 % (ref 36.0–46.0)
Hemoglobin: 12.5 g/dL (ref 12.0–15.0)
Immature Granulocytes: 2 %
Lymphocytes Relative: 22 %
Lymphs Abs: 1 10*3/uL (ref 0.7–4.0)
MCH: 32.6 pg (ref 26.0–34.0)
MCHC: 32.1 g/dL (ref 30.0–36.0)
MCV: 101.3 fL — ABNORMAL HIGH (ref 80.0–100.0)
Monocytes Absolute: 0.8 10*3/uL (ref 0.1–1.0)
Monocytes Relative: 17 %
Neutro Abs: 2.7 10*3/uL (ref 1.7–7.7)
Neutrophils Relative %: 58 %
Platelet Count: 285 10*3/uL (ref 150–400)
RBC: 3.84 MIL/uL — ABNORMAL LOW (ref 3.87–5.11)
RDW: 17.7 % — ABNORMAL HIGH (ref 11.5–15.5)
WBC Count: 4.7 10*3/uL (ref 4.0–10.5)
nRBC: 0 % (ref 0.0–0.2)

## 2022-10-19 LAB — CMP (CANCER CENTER ONLY)
ALT: 19 U/L (ref 0–44)
AST: 20 U/L (ref 15–41)
Albumin: 3.9 g/dL (ref 3.5–5.0)
Alkaline Phosphatase: 83 U/L (ref 38–126)
Anion gap: 8 (ref 5–15)
BUN: 23 mg/dL (ref 8–23)
CO2: 27 mmol/L (ref 22–32)
Calcium: 8.6 mg/dL — ABNORMAL LOW (ref 8.9–10.3)
Chloride: 104 mmol/L (ref 98–111)
Creatinine: 0.91 mg/dL (ref 0.44–1.00)
GFR, Estimated: >60 mL/min (ref >60)
Glucose, Bld: 118 mg/dL — ABNORMAL HIGH (ref 70–99)
Potassium: 4.5 mmol/L (ref 3.5–5.1)
Sodium: 139 mmol/L (ref 135–145)
Total Bilirubin: 0.4 mg/dL (ref 0.3–1.2)
Total Protein: 6.4 g/dL — ABNORMAL LOW (ref 6.5–8.1)

## 2022-10-19 MED ORDER — DEXTROSE 5 % IV SOLN: Freq: Once | INTRAVENOUS | Status: AC

## 2022-10-19 MED ORDER — DIPHENHYDRAMINE HCL 25 MG PO CAPS
50.0000 mg | ORAL_CAPSULE | Freq: Once | ORAL | Status: AC
  Administered 2022-10-19: 50 mg via ORAL
  Filled 2022-10-19: qty 2

## 2022-10-19 MED ORDER — SODIUM CHLORIDE 0.9 % IV SOLN
150.0000 mg | Freq: Once | INTRAVENOUS | Status: AC
  Administered 2022-10-19: 150 mg via INTRAVENOUS
  Filled 2022-10-19: qty 150

## 2022-10-19 MED ORDER — TRAMADOL HCL 50 MG PO TABS: 50.0000 mg | ORAL_TABLET | Freq: Four times a day (QID) | ORAL | 0 refills | Status: DC | PRN

## 2022-10-19 MED ORDER — FAM-TRASTUZUMAB DERUXTECAN-NXKI CHEMO 100 MG IV SOLR
360.0000 mg | Freq: Once | INTRAVENOUS | Status: AC
  Administered 2022-10-19: 360 mg via INTRAVENOUS
  Filled 2022-10-19: qty 18

## 2022-10-19 MED ORDER — ACETAMINOPHEN 325 MG PO TABS
650.0000 mg | ORAL_TABLET | Freq: Once | ORAL | Status: AC
  Administered 2022-10-19: 650 mg via ORAL
  Filled 2022-10-19: qty 2

## 2022-10-19 MED ORDER — SODIUM CHLORIDE 0.9 % IV SOLN
10.0000 mg | Freq: Once | INTRAVENOUS | Status: AC
  Administered 2022-10-19: 10 mg via INTRAVENOUS
  Filled 2022-10-19: qty 10

## 2022-10-19 MED ORDER — PALONOSETRON HCL INJECTION 0.25 MG/5ML
0.2500 mg | Freq: Once | INTRAVENOUS | Status: AC
  Administered 2022-10-19: 0.25 mg via INTRAVENOUS
  Filled 2022-10-19: qty 5

## 2022-10-19 NOTE — Patient Instructions (Signed)
[facility]  Discharge Instructions: Thank you for choosing [facility] to provide your oncology and hematology care.   If you have a lab appointment with the Cancer Center, please go directly to the Cancer Center and check in at the registration area.  Wear comfortable clothing and clothing appropriate for easy access to any Portacath or PICC line.   We strive to give you quality time with your provider. You may need to reschedule your appointment if you arrive late (15 or more minutes).  Arriving late affects you and other patients whose appointments are after yours.  Also, if you miss three or more appointments without notifying the office, you may be dismissed from the clinic at the provider's discretion.      For prescription refill requests, have your pharmacy contact our office and allow 72 hours for refills to be completed.    Today you received the following chemotherapy and/or immunotherapy agents   Trastuzumab  [id number]   To help prevent nausea and vomiting after your treatment, we encourage you to take your nausea medication as directed.  BELOW ARE SYMPTOMS THAT SHOULD BE REPORTED IMMEDIATELY: *FEVER GREATER THAN 100.4 F (38 C) OR HIGHER *CHILLS OR SWEATING *NAUSEA AND VOMITING THAT IS NOT CONTROLLED WITH YOUR NAUSEA MEDICATION *UNUSUAL SHORTNESS OF BREATH *UNUSUAL BRUISING OR BLEEDING *URINARY PROBLEMS (pain or burning when urinating, or frequent urination) *BOWEL PROBLEMS (unusual diarrhea, constipation, pain near the anus) TENDERNESS IN MOUTH AND THROAT WITH OR WITHOUT PRESENCE OF ULCERS (sore throat, sores in mouth, or a toothache) UNUSUAL RASH, SWELLING OR PAIN  UNUSUAL VAGINAL DISCHARGE OR ITCHING   Items with * indicate a potential emergency and should be followed up as soon as possible or go to the Emergency Department if any problems should occur.  Please show the CHEMOTHERAPY ALERT CARD or IMMUNOTHERAPY  ALERT CARD at check-in to the Emergency Department and triage nurse. Should you have questions after your visit or need to cancel or reschedule your appointment, please contact [facility]  [phone] and follow the prompts.  Office hours are 8:00 a.m. to 4:30 p.m. Monday - Friday. Please note that voicemails left after 4:00 p.m. may not be returned until the following business day.  We are closed weekends and major holidays. You have access to a nurse at all times for urgent questions. Please call the main number to the clinic [phone] and follow the prompts.  For any non-urgent questions, you may also contact your provider using MyChart. We now offer e-Visits for anyone 65 and older to request care online for non-urgent symptoms. For details visit [email or web address].   Also download the MyChart app! Go to the app store, search "MyChart", open the app, select [place], and log in with your MyChart username and password.

## 2022-10-19 NOTE — Progress Notes (Signed)
Hematology and Oncology Follow Up Visit  Patricia [name] [id number] 08-Sep-1940 82 y.o. 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  She is doing okay.  Reactive to PET scan on her recently.  The PET scan has to show that she had been responding to treatment.  She had decrease in her pulmonary lesions.  The 1 change was that there was a relatively large left pleural effusion.  I suspect this probably was from her cardiac issues.  She did undergo a thoracentesis.  They removed quite a bit of fluid.  Thankfully, the cytology ([id number]) was negative for any malignancy.  Her last CA 27.29 was down to 34.  She states she feels okay.  She has had no palpitations.  She is on anticoagulation because of her atrial fibrillation.  She has had a little bit of hip pain with the left hip.  She has had no nausea or vomiting.  There is been no cough or increased shortness of breath.  Currently, I would say that her performance status is probably ECOG 1.    Wt Readings from Last 3 Encounters:  10/19/22 140 lb 1.3 oz (63.5 kg)  09/28/22 140 lb (63.5 kg)  09/07/22 140 lb (63.5 kg)     Medications:  Current Outpatient Medications:    albuterol (PROVENTIL HFA;VENTOLIN HFA) 108 (90 Base) MCG/ACT inhaler, Inhale 2 puffs into the lungs every 6 (six) hours as needed for wheezing or shortness of breath., Disp: , Rfl:    apixaban (ELIQUIS) 5 MG TABS tablet, Take 1 tablet (5 mg total) by mouth 2 (two) times daily., Disp: 180 tablet, Rfl: 3   Apoaequorin (PREVAGEN) 10 MG CAPS, Take 1 capsule by mouth every morning., Disp: 90 capsule,  Rfl: 3   atorvastatin (LIPITOR) 40 MG tablet, Take 1 tablet (40 mg total) by mouth daily before supper., Disp: 90 tablet, Rfl: 3   bimatoprost (LUMIGAN) 0.01 % SOLN, Place 1 drop into both eyes 2 (two) times daily., Disp: , Rfl:    calcium-vitamin D (OSCAL WITH D) 500-200 MG-UNIT tablet, Take 1 tablet by mouth daily with breakfast., Disp: 30 tablet, Rfl: 0   dexamethasone (DECADRON) 4 MG tablet, Take 2 tablets (8 mg) by mouth daily for 3 days starting the day after chemotherapy. Take with food., Disp: 30 tablet, Rfl: 1   digoxin (LANOXIN) 0.125 MG tablet, Take 1 tablet (0.125 mg total) by mouth daily., Disp: 90 tablet, Rfl: 3   dorzolamide-timolol (COSOPT) 22.3-6.8 MG/ML ophthalmic solution, Place 1 drop into both eyes at bedtime., Disp: , Rfl:    fluticasone (FLONASE) 50 MCG/ACT nasal spray, 1 spray each nostril following sinus rinses twice daily, Disp: 16 g, Rfl: 2   fluticasone-salmeterol (ADVAIR HFA) 230-21 MCG/ACT inhaler, USE 2 INHALATIONS TWICE A DAY, Disp: 36 g, Rfl: 3   folic acid (FOLVITE) 400 MCG tablet, Take 400 mcg by mouth daily., Disp: , Rfl:    furosemide (LASIX) 20 MG tablet, Take 1 tablet (20 mg total) by mouth 2 (two) times daily. Take additional 20 mg as needed for swelling, Disp: 180 tablet, Rfl: 2   gabapentin (NEURONTIN) 400 MG capsule,  TAKE 1 CAPSULE TWICE A DAY, Disp: 180 capsule, Rfl: 3   L-Methylfolate-B12-B6-B2 (CEREFOLIN) 07-31-48-5 MG TABS, Take 1 tablet by mouth every morning., Disp: 90 tablet, Rfl: 3   levETIRAcetam (KEPPRA XR) 500 MG 24 hr tablet, TAKE 1 TABLET AT BEDTIME, Disp: 90 tablet, Rfl: 3   loratadine (CLARITIN) 10 MG tablet, Take 10 mg by mouth daily., Disp: , Rfl:    memantine (NAMENDA) 10 MG tablet, TAKE 1 TABLET TWICE A DAY, Disp: 180 tablet, Rfl: 3   metoprolol tartrate (LOPRESSOR) 50 MG tablet, Take 1 tablet (50 mg total) by mouth 2 (two) times daily., Disp: 180 tablet, Rfl: 3   Multiple Vitamins-Iron (MULTIVITAMIN/IRON) TABS, Take 1 tablet by mouth  daily., Disp: , Rfl:    Netarsudil Dimesylate (RHOPRESSA) 0.02 % SOLN, Place 1 drop into both eyes at bedtime., Disp: , Rfl:    OVER THE COUNTER MEDICATION, Take 1 tablet by mouth daily. Zyflamend otc herbal supplement, Disp: , Rfl:    pantoprazole (PROTONIX) 40 MG tablet, TAKE 1 TABLET DAILY, Disp: 90 tablet, Rfl: 3   potassium chloride SA (KLOR-CON M) 20 MEQ tablet, TAKE 1 TABLET TWICE A DAY, Disp: 180 tablet, Rfl: 3   thyroid (ARMOUR THYROID) 15 MG tablet, Take 1 tablet (15 mg total) by mouth daily. Take with 60mg  for a total dose of 75mg  daily., Disp: , Rfl:    thyroid (ARMOUR THYROID) 60 MG tablet, Take 1 tablet (60 mg total) by mouth daily. Takes with 15mg  daily., Disp: , Rfl:    traMADol (ULTRAM) 50 MG tablet, Take 1 tablet (50 mg total) by mouth every 6 (six) hours as needed (post op pain)., Disp: 20 tablet, Rfl: 0   Vitamin D, Ergocalciferol, (DRISDOL) 1.25 MG (50000 UNIT) CAPS capsule, TAKE 1 CAPSULE WEEKLY, Disp: 12 capsule, Rfl: 3   ondansetron (ZOFRAN) 8 MG tablet, Take 1 tablet (8 mg total) by mouth every 8 (eight) hours as needed for nausea or vomiting. Start on the third day after chemotherapy. (Patient not taking: Reported on 10/19/2022), Disp: 30 tablet, Rfl: 1   prochlorperazine (COMPAZINE) 10 MG tablet, Take 1 tablet (10 mg total) by mouth every 6 (six) hours as needed for nausea or vomiting. (Patient not taking: Reported on 10/19/2022), Disp: 30 tablet, Rfl: 1 No current facility-administered medications for this visit.  Facility-Administered Medications Ordered in Other Visits:    acetaminophen (TYLENOL) tablet 650 mg, 650 mg, Oral, Once, Armentha Branagan, Rose Phi, MD   dexamethasone (DECADRON) 10 mg in sodium chloride 0.9 % 50 mL IVPB, 10 mg, Intravenous, Once, Cordarrius Coad, Rose Phi, MD   dextrose 5 % solution, , Intravenous, Once, Lunell Robart, Rose Phi, MD   diphenhydrAMINE (BENADRYL) capsule 50 mg, 50 mg, Oral, Once, Smt Lokey, Rose Phi, MD   fosaprepitant (EMEND) 150 mg in sodium chloride 0.9 %  145 mL IVPB, 150 mg, Intravenous, Once, Maureena Dabbs, Rose Phi, MD   palonosetron (ALOXI) injection 0.25 mg, 0.25 mg, Intravenous, Once, Susette Seminara, Rose Phi, MD  Allergies:  Allergies  Allergen Reactions   Combigan [Brimonidine Tartrate-Timolol] Itching    Per patient made eyes red, sore, and sensitivity to light   Sulfa Antibiotics Hives and Rash   Sulfamethoxazole-Trimethoprim Hives    Past Medical History, Surgical history, Social history, and Family History were reviewed and updated.  Review of Systems: Review of Systems  Constitutional:  Positive for fatigue. Negative for appetite change.  HENT:  Negative.    Eyes: Negative.   Respiratory:  Positive for shortness of breath. Negative for chest tightness.  Cardiovascular: Negative.   Gastrointestinal: Negative.   Endocrine: Negative.   Genitourinary: Negative.    Musculoskeletal:  Positive for gait problem.  Skin: Negative.   Neurological:  Positive for dizziness and gait problem. Negative for headaches and light-headedness.  Hematological: Negative.   Psychiatric/Behavioral: Negative.      Physical Exam:  height is 5' (1.524 m) and weight is 140 lb 1.3 oz (63.5 kg). Her oral temperature is 97.9 F (36.6 C). Her blood pressure is 117/74 and her pulse is 95. Her respiration is 18 and oxygen saturation is 97%.   Wt Readings from Last 3 Encounters:  10/19/22 140 lb 1.3 oz (63.5 kg)  09/28/22 140 lb (63.5 kg)  09/07/22 140 lb (63.5 kg)    Physical Exam Vitals reviewed.  HENT:     Head: Normocephalic and atraumatic.  Eyes:     Pupils: Pupils are equal, round, and reactive to light.  Cardiovascular:     Rate and Rhythm: Normal rate and regular rhythm.     Heart sounds: Normal heart sounds.  Pulmonary:     Effort: Pulmonary effort is normal.     Breath sounds: Normal breath sounds.  Abdominal:     General: Bowel sounds are normal.     Palpations: Abdomen is soft.  Musculoskeletal:        General: No tenderness or  deformity. Normal range of motion.     Cervical back: Normal range of motion.     Left lower leg: Edema present.     Comments: There is 2+ edema of the left leg throughout. The leg is mildly cooler to touch than the right. 1+ DP of the left leg. No cyanosis of skin. No active bleeding.   Lymphadenopathy:     Cervical: No cervical adenopathy.  Skin:    General: Skin is warm and dry.     Findings: No erythema or rash.  Neurological:     Mental Status: She is alert and oriented to person, place, and time.  Psychiatric:        Behavior: Behavior normal.        Thought Content: Thought content normal.        Judgment: Judgment normal.     Lab Results  Component Value Date   WBC 4.7 10/19/2022   HGB 12.5 10/19/2022   HCT 38.9 10/19/2022   MCV 101.3 (H) 10/19/2022   PLT 285 10/19/2022     Chemistry      Component Value Date/Time   NA 139 10/19/2022 1206   NA 141 08/24/2022 1002   NA 141 09/22/2016 1459   K 4.5 10/19/2022 1206   K 3.8 09/22/2016 1459   CL 104 10/19/2022 1206   CO2 27 10/19/2022 1206   CO2 26 09/22/2016 1459   BUN 23 10/19/2022 1206   BUN 18 08/24/2022 1002   BUN 19.8 09/22/2016 1459   CREATININE 0.91 10/19/2022 1206   CREATININE 1.1 09/22/2016 1459      Component Value Date/Time   CALCIUM 8.6 (L) 10/19/2022 1206   CALCIUM 9.2 09/22/2016 1459   ALKPHOS 83 10/19/2022 1206   ALKPHOS 98 09/22/2016 1459   AST 20 10/19/2022 1206   AST 15 09/22/2016 1459   ALT 19 10/19/2022 1206   ALT 21 09/22/2016 1459   BILITOT 0.4 10/19/2022 1206   BILITOT 0.65 09/22/2016 1459      Impression and Plan: Ms. Hadorn is a very nice 82 year old postmenopausal white female.  She has a solitary metastatic focus from breast  cancer.  Her initial breast cancer was probably 7 years ago.  It was estrogen positive. 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  The HER2 was equivocal on initial staining but on FISH was negative.  For right now, we will continue her on the Enhertu.  I  think she is doing well with the Enhertu.  Her last echocardiogram was done back in May with ejection fraction of 60-65%.  I think that as long as she is responding and having no problems with toxicity, we will continue the Enhertu.  We will plan to get her back in another 3 weeks.   Josph Macho, MD 8/19/20242:23 PM

## 2022-10-19 NOTE — Progress Notes (Signed)
Difficult IV start and maintenance today. Pt. Stated that she has not been drinking much at home. Encourage both pt. and husband that she should increase fluid intake throughout the day.

## 2022-10-20 ENCOUNTER — Encounter: Payer: Self-pay | Admitting: *Deleted

## 2022-10-20 LAB — CANCER ANTIGEN 27.29: CA 27.29: 26.8 U/mL (ref 0.0–38.6)

## 2022-10-20 NOTE — Progress Notes (Unsigned)
Patient proceeded with next cycle.   Oncology Nurse Navigator Documentation     10/20/2022    9:15 AM  Oncology Nurse Navigator Flowsheets  Navigator Follow Up Date: 11/09/2022  Navigator Follow Up Reason: Follow-up Appointment;Chemotherapy  Navigator Location [facility]  Navigator Encounter Type Appt/Treatment Plan Review  Patient Visit Type MedOnc  Treatment Phase Active Tx  Barriers/Navigation Needs No Barriers At This Time  Interventions None Required  Acuity Level 1-No Barriers  Support Groups/Services Friends and Family  Time Spent with Patient 15

## 2022-10-21 ENCOUNTER — Encounter: Payer: Self-pay | Admitting: Hematology & Oncology

## 2022-10-22 ENCOUNTER — Ambulatory Visit (INDEPENDENT_AMBULATORY_CARE_PROVIDER_SITE_OTHER): Payer: Medicare Other | Admitting: Family Medicine

## 2022-10-22 ENCOUNTER — Encounter: Payer: Self-pay | Admitting: Family Medicine

## 2022-10-22 VITALS — BP 131/86 | HR 91 | Resp 18 | Ht 60.0 in | Wt 143.0 lb

## 2022-10-22 DIAGNOSIS — I10 Essential (primary) hypertension: Secondary | ICD-10-CM | POA: Diagnosis not present

## 2022-10-22 DIAGNOSIS — E032 Hypothyroidism due to medicaments and other exogenous substances: Secondary | ICD-10-CM | POA: Diagnosis not present

## 2022-10-22 MED ORDER — THYROID 60 MG PO TABS
60.0000 mg | ORAL_TABLET | Freq: Every day | ORAL | 1 refills | Status: DC
Start: 2022-10-22 — End: 2023-01-21

## 2022-10-22 MED ORDER — THYROID 15 MG PO TABS
15.0000 mg | ORAL_TABLET | Freq: Every day | ORAL | 1 refills | Status: DC
Start: 2022-10-22 — End: 2023-01-21

## 2022-10-22 NOTE — Progress Notes (Signed)
   Established Patient Office Visit  Subjective   Patient ID: Patricia [name], female    DOB: 1940-08-18  Age: 82 y.o. MRN: [id number]  Chief Complaint  Patient presents with   Hypertension    HPI Patricia [name] is a 82 y.o. female presenting today for follow up of hypertension and hypothyroidism.  She continues to get infusions at regular intervals with oncology, most recent infusion was 10/19/2022. Hypertension: Pt denies chest pain, SOB, dizziness, edema, syncope, fatigue or heart palpitations. Taking furosemide and metoprolol, reports excellent compliance with treatment. Denies side effects. Hypothyroidism: Taking levothyroxine 75 mcg total regularly in the AM away from food and vitamins. Denies fatigue, weight changes, heat/cold intolerance, skin/hair changes, bowel changes, CVS symptoms.  ROS Negative unless otherwise noted in HPI   Objective:     BP 131/86 (BP Location: Right Arm, Patient Position: Sitting, Cuff Size: Normal)   Pulse 91   Resp 18   Ht 5' (1.524 m)   Wt 143 lb (64.9 kg)   SpO2 97%   BMI 27.93 kg/m   Physical Exam Constitutional:      General: She is not in acute distress.    Appearance: Normal appearance.  HENT:     Head: Normocephalic and atraumatic.  Cardiovascular:     Rate and Rhythm: Normal rate and regular rhythm.     Heart sounds: No murmur heard.    No friction rub. No gallop.  Pulmonary:     Effort: Pulmonary effort is normal. No respiratory distress.     Breath sounds: No wheezing, rhonchi or rales.  Skin:    General: Skin is warm and dry.  Neurological:     Mental Status: She is alert and oriented to person, place, and time.     Assessment & Plan:  Essential hypertension Assessment & Plan: BP goal <140/90.  Also followed by cardiology.  Continue metoprolol tartrate 50 mg twice daily, furosemide 20 mg twice daily, digoxin 0.125 mg daily.  Will continue to monitor and coordinate care with cardiology.   Hypothyroidism due to  medication Assessment & Plan: Most recent TSH very high at 20.5, which is also a significant increase from its last check on 08/14/2022.  Recheck thyroid panel before next appointment and if TSH still elevated, consider increasing levothyroxine.  For now, continue levothyroxine 75 mcg daily.  Orders: -     Thyroid; Take 1 tablet (60 mg total) by mouth daily. Takes with 15mg  daily.  Dispense: 90 tablet; Refill: 1 -     Thyroid; Take 1 tablet (15 mg total) by mouth daily. Take with 60mg  for a total dose of 75mg  daily.  Dispense: 90 tablet; Refill: 1    Return in about 10 weeks (around 12/31/2022) for follow-up for HTN and thyroid, blood work (not fasting) 1 week prior.    [name], PA

## 2022-10-22 NOTE — Assessment & Plan Note (Signed)
Most recent TSH very high at 20.5, which is also a significant increase from its last check on 08/14/2022.  Recheck thyroid panel before next appointment and if TSH still elevated, consider increasing levothyroxine.  For now, continue levothyroxine 75 mcg daily.

## 2022-10-22 NOTE — Assessment & Plan Note (Signed)
BP goal <140/90.  Also followed by cardiology.  Continue metoprolol tartrate 50 mg twice daily, furosemide 20 mg twice daily, digoxin 0.125 mg daily.  Will continue to monitor and coordinate care with cardiology.

## 2022-10-29 ENCOUNTER — Other Ambulatory Visit (HOSPITAL_COMMUNITY): Payer: Self-pay | Admitting: Student

## 2022-10-29 ENCOUNTER — Ambulatory Visit: Payer: Medicare Other

## 2022-10-29 VITALS — Ht 60.0 in | Wt 143.0 lb

## 2022-10-29 DIAGNOSIS — Z0000 Encounter for general adult medical examination without abnormal findings: Secondary | ICD-10-CM | POA: Diagnosis not present

## 2022-10-29 NOTE — Progress Notes (Signed)
Subjective:   Patricia [name] is a 82 y.o. female who presents for Medicare Annual (Subsequent) preventive examination.  Visit Complete: Virtual  I connected with  [name] on 10/29/22 by a audio enabled telemedicine application and verified that I am speaking with the correct person using two identifiers.  Patient Location: Home  Provider Location: Home Office  I discussed the limitations of evaluation and management by telemedicine. The patient expressed understanding and agreed to proceed.   Review of Systems    Vital Signs: Unable to obtain new vitals due to this being a telehealth visit.  Cardiac Risk Factors include: advanced age (>52men, >17 women);hypertension     Objective:    Today's Vitals   10/25/22 1058 10/29/22 0832  Weight:  143 lb (64.9 kg)  Height:  5' (1.524 m)  PainSc: 0-No pain    Body mass index is 27.93 kg/m.     10/29/2022    8:46 AM 10/19/2022    1:58 PM 09/28/2022    1:41 PM 09/07/2022   12:19 PM 08/14/2022    1:58 PM 08/14/2022    1:56 PM 08/13/2022    8:30 PM  Advanced Directives  Does Patient Have a Medical Advance Directive? Yes Yes Yes Yes  Yes Yes  Type of Estate agent of [place];Living will Living will;Healthcare Power of Attorney Living will;Healthcare Power of [employer] Power of [place];Living will Healthcare Power of [place];Living will Healthcare Power of [employer] of [place];Living will  Does patient want to make changes to medical advance directive? No - Patient declined  No - Patient declined No - Patient declined  Yes (MAU/Ambulatory/Procedural Areas - Information given) No - Patient declined  Copy of Healthcare Power of Attorney in Chart? Yes - validated most recent copy scanned in chart (See row information)      No - copy requested  Would patient like information on creating a medical advance directive?       No - Patient declined    Current Medications (verified) Outpatient  Encounter Medications as of 10/29/2022  Medication Sig   albuterol (PROVENTIL HFA;VENTOLIN HFA) 108 (90 Base) MCG/ACT inhaler Inhale 2 puffs into the lungs every 6 (six) hours as needed for wheezing or shortness of breath.   apixaban (ELIQUIS) 5 MG TABS tablet Take 1 tablet (5 mg total) by mouth 2 (two) times daily.   Apoaequorin (PREVAGEN) 10 MG CAPS Take 1 capsule by mouth every morning.   atorvastatin (LIPITOR) 40 MG tablet Take 1 tablet (40 mg total) by mouth daily before supper.   bimatoprost (LUMIGAN) 0.01 % SOLN Place 1 drop into both eyes 2 (two) times daily.   calcium-vitamin D (OSCAL WITH D) 500-200 MG-UNIT tablet Take 1 tablet by mouth daily with breakfast.   dexamethasone (DECADRON) 4 MG tablet Take 2 tablets (8 mg) by mouth daily for 3 days starting the day after chemotherapy. Take with food.   digoxin (LANOXIN) 0.125 MG tablet Take 1 tablet (0.125 mg total) by mouth daily.   dorzolamide-timolol (COSOPT) 22.3-6.8 MG/ML ophthalmic solution Place 1 drop into both eyes at bedtime.   fluticasone (FLONASE) 50 MCG/ACT nasal spray 1 spray each nostril following sinus rinses twice daily   fluticasone-salmeterol (ADVAIR HFA) 230-21 MCG/ACT inhaler USE 2 INHALATIONS TWICE A DAY   folic acid (FOLVITE) 400 MCG tablet Take 400 mcg by mouth daily.   furosemide (LASIX) 20 MG tablet Take 1 tablet (20 mg total) by mouth 2 (two) times daily. Take additional 20 mg as  needed for swelling   gabapentin (NEURONTIN) 400 MG capsule TAKE 1 CAPSULE TWICE A DAY   L-Methylfolate-B12-B6-B2 (CEREFOLIN) 07-31-48-5 MG TABS Take 1 tablet by mouth every morning.   levETIRAcetam (KEPPRA XR) 500 MG 24 hr tablet TAKE 1 TABLET AT BEDTIME   loratadine (CLARITIN) 10 MG tablet Take 10 mg by mouth daily.   memantine (NAMENDA) 10 MG tablet TAKE 1 TABLET TWICE A DAY   metoprolol tartrate (LOPRESSOR) 50 MG tablet Take 1 tablet (50 mg total) by mouth 2 (two) times daily.   Multiple Vitamins-Iron (MULTIVITAMIN/IRON) TABS Take 1  tablet by mouth daily.   Netarsudil Dimesylate (RHOPRESSA) 0.02 % SOLN Place 1 drop into both eyes at bedtime.   ondansetron (ZOFRAN) 8 MG tablet Take 1 tablet (8 mg total) by mouth every 8 (eight) hours as needed for nausea or vomiting. Start on the third day after chemotherapy.   OVER THE COUNTER MEDICATION Take 1 tablet by mouth daily. Zyflamend otc herbal supplement   pantoprazole (PROTONIX) 40 MG tablet TAKE 1 TABLET DAILY   potassium chloride SA (KLOR-CON M) 20 MEQ tablet TAKE 1 TABLET TWICE A DAY   prochlorperazine (COMPAZINE) 10 MG tablet Take 1 tablet (10 mg total) by mouth every 6 (six) hours as needed for nausea or vomiting.   thyroid (ARMOUR THYROID) 15 MG tablet Take 1 tablet (15 mg total) by mouth daily. Take with 60mg  for a total dose of 75mg  daily.   thyroid (ARMOUR THYROID) 60 MG tablet Take 1 tablet (60 mg total) by mouth daily. Takes with 15mg  daily.   traMADol (ULTRAM) 50 MG tablet Take 1 tablet (50 mg total) by mouth every 6 (six) hours as needed (post op pain).   Vitamin D, Ergocalciferol, (DRISDOL) 1.25 MG (50000 UNIT) CAPS capsule TAKE 1 CAPSULE WEEKLY   Facility-Administered Encounter Medications as of 10/29/2022  Medication   acetaminophen (TYLENOL) tablet 650 mg   dexamethasone (DECADRON) 10 mg in sodium chloride 0.9 % 50 mL IVPB   dextrose 5 % solution   diphenhydrAMINE (BENADRYL) capsule 50 mg   fosaprepitant (EMEND) 150 mg in sodium chloride 0.9 % 145 mL IVPB   palonosetron (ALOXI) injection 0.25 mg    Allergies (verified) Combigan [brimonidine tartrate-timolol], Sulfa antibiotics, and Sulfamethoxazole-trimethoprim   History: Past Medical History:  Diagnosis Date   Aortic regurgitation 06/17/2015   mild by echo 2021   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 Dx 04/2015 at time of stroke. b. DCCV 06/2015 - did not hold. Amio started then stopped due to QT prolongation.    Chronic diastolic heart failure (HCC) 06/17/2015   CKD (chronic kidney disease), stage III (HCC)    Cough variant asthma    Dementia (HCC)    Diverticulosis    Essential hypertension    GERD (gastroesophageal reflux disease)    Glaucoma    Hiatal hernia    Hyperlipidemia    Hypothyroidism    Internal hemorrhoids    Laryngospasm    Mitral regurgitation    mild to moderate by echo 12/2019 and moderate by TEE 12/2020   Multinodular goiter    Osteopenia    Postmenopausal    Pulmonary HTN (HCC)     Moderate with PASP by echo 2018 likely Group 2 from pulmonary venous HTN from CHF>>normal PAP by echo 12/2019   Radiation 10/22/14-11/23/14   Left Breast  Stricture and stenosis of cervix    Stroke (HCC)    TIAs   Past Surgical History:  Procedure Laterality Date   BREAST LUMPECTOMY Left    BREAST SURGERY     CARDIOVERSION N/A 06/24/2015   Procedure: CARDIOVERSION;  Surgeon: Quintella Reichert, MD;  Location: [facility];  Service: Cardiovascular;  Laterality: N/A;   CARDIOVERSION N/A 05/19/2016   Procedure: CARDIOVERSION;  Surgeon: Quintella Reichert, MD;  Location: [facility];  Service: Cardiovascular;  Laterality: N/A;   COLONOSCOPY     EYE SURGERY Bilateral    cataract removal by Dr. Burgess Estelle   FEMUR IM NAIL Left 07/28/2022   Procedure: INTRAMEDULLARY (IM) NAIL FEMORAL;  Surgeon: Marcene Corning, MD;  Location: [facility];  Service: Orthopedics;  Laterality: Left;   LUNG CANCER SURGERY  2003   resection carcinoid lingula-lt upper lobe   RADIOACTIVE SEED GUIDED PARTIAL MASTECTOMY WITH AXILLARY SENTINEL LYMPH NODE BIOPSY Left 06/26/2014   Procedure: RADIOACTIVE SEED GUIDED PARTIAL MASTECTOMY WITH AXILLARY SENTINEL LYMPH NODE BIOPSY;  Surgeon: Almond Lint, MD;  Location: [facility];  Service: General;  Laterality: Left;   RE-EXCISION OF BREAST LUMPECTOMY Left 08/07/2014   Procedure: RE-EXCISION OF LEFT BREAST LUMPECTOMY;  Surgeon: Almond Lint, MD;  Location: [facility];  Service: General;  Laterality: Left;   RIGHT HEART CATH N/A 04/27/2016   Procedure: Right Heart Cath;  Surgeon: Laurey Morale, MD;  Location: [facility];  Service: Cardiovascular;  Laterality: N/A;   TEE WITHOUT CARDIOVERSION N/A 01/07/2021   Procedure: TRANSESOPHAGEAL ECHOCARDIOGRAM (TEE);  Surgeon: Elease Hashimoto, Deloris Ping, MD;  Location: [facility];  Service: Cardiovascular;  Laterality: N/A;   Family History  Problem Relation Age of Onset   Stroke Mother    Hypertension Mother    Hyperlipidemia Mother    Stroke Father    Transient ischemic attack Father    Hyperlipidemia Father    Hypertension Father    Atrial fibrillation Son    Heart attack Neg Hx    Social History   Socioeconomic History   Marital status: Married    Spouse name: Leonette Most   Number of children: 2   Years of education: Not on file   Highest education level: High school graduate  Occupational History   Occupation: Retired  Tobacco Use   Smoking status: Former    Current packs/day: 0.00    Average packs/day: 0.8 packs/day for 5.0 years (3.8 ttl pk-yrs)    Types: Cigarettes    Start date: 06/14/1961    Quit date: 06/15/1966    Years since quitting: 56.4    Passive exposure: Past   Smokeless tobacco: Never  Vaping Use   Vaping status: Never Used  Substance and Sexual Activity   Alcohol use: Not Currently    Comment: occasional wine   Drug use: No   Sexual activity: Not Currently    Birth control/protection: Post-menopausal  Other Topics Concern   Not on file  Social History Narrative   Retired. Lives with husband.    Right Handed   Drinks 1-2 cups caffeine daily   Social Determinants of Health   Financial Resource Strain: Low Risk  (10/25/2022)   Overall Financial Resource Strain (CARDIA)    Difficulty of Paying Living Expenses: Not hard at all  Food Insecurity: No Food Insecurity (10/25/2022)   Hunger Vital Sign    Worried About Running Out of Food in the Last Year: Never  true    Ran Out of Food in  the Last Year: Never true  Transportation Needs: No Transportation Needs (10/25/2022)   PRAPARE - Administrator, Civil Service (Medical): No    Lack of Transportation (Non-Medical): No  Physical Activity: Inactive (10/25/2022)   Exercise Vital Sign    Days of Exercise per Week: 0 days    Minutes of Exercise per Session: 0 min  Stress: No Stress Concern Present (10/25/2022)   [employer] of Occupational Health - Occupational Stress Questionnaire    Feeling of Stress : Not at all  Social Connections: Unknown (10/25/2022)   Social Connection and Isolation Panel [NHANES]    Frequency of Communication with Friends and Family: Twice a week    Frequency of Social Gatherings with Friends and Family: Twice a week    Attends Religious Services: Not on Marketing executive or Organizations: Yes    Attends Engineer, structural: More than 4 times per year    Marital Status: Married    Tobacco Counseling Counseling given: Not Answered   Clinical Intake:  Pre-visit preparation completed: Yes  Pain : No/denies pain Pain Score: 0-No pain     BMI - recorded: 27.93 Nutritional Status: BMI 25 -29 Overweight Nutritional Risks: None Diabetes: No  How often do you need to have someone help you when you read instructions, pamphlets, or other written materials from your doctor or pharmacy?: 2 - Rarely (Husband assist)  Interpreter Needed?: No  Information entered by :: Theresa Mulligan LPN   Activities of Daily Living    10/25/2022   10:58 AM 08/13/2022    8:30 PM  In your present state of health, do you have any difficulty performing the following activities:  Hearing? 0 1  Vision? 0 0  Difficulty concentrating or making decisions? 1 1  Comment Short term memory loss. Followed by medical attention   Walking or climbing stairs? 1 1  Comment Uses a walker and cane   Dressing or bathing? 1 1  Comment Husband assist   Doing  errands, shopping? 1 0  Comment Husband assist   Preparing Food and eating ? N   Using the Toilet? N   In the past six months, have you accidently leaked urine? N   Do you have problems with loss of bowel control? N   Managing your Medications? Y   Comment Husband assist   Managing your Finances? N   Housekeeping or managing your Housekeeping? N     Patient Care Team: [name], PA as PCP - General (Family Medicine) Quintella Reichert, MD as PCP - Cardiology (Cardiology) Regan Lemming, MD as PCP - Electrophysiology (Cardiology) Lurline Hare, MD as Consulting Physician (Radiation Oncology) Almond Lint, MD as Consulting Physician (General Surgery) Meryl Dare, MD as Consulting Physician (Gastroenterology) Janet Berlin, MD as Consulting Physician (Ophthalmology) Micki Riley, MD as Consulting Physician (Neurology) Kathreen Cornfield, PhD as Referring Physician (Neurology) Josph Macho, MD as Medical Oncologist (Oncology) Gwendel Hanson, RN as Oncology Nurse Navigator  Indicate any recent Medical Services you may have received from other than [facility] providers in the past year (date may be approximate).     Assessment:   This is a routine wellness examination for [facility].  Hearing/Vision screen Hearing Screening - Comments:: Denies hearing difficulties   Vision Screening - Comments:: Wears rx glasses - up to date with routine eye exams with  Dr Burgess Estelle  Dietary issues and exercise activities discussed:     Goals  Addressed               This Visit's Progress     Maintain good health (pt-stated)         Depression Screen    10/29/2022    8:41 AM 04/23/2022    3:18 PM 10/09/2021   10:36 AM 11/28/2020    9:25 AM 10/15/2020    8:20 AM 10/03/2020   10:36 AM 06/03/2020    1:39 PM  PHQ 2/9 Scores  PHQ - 2 Score 0 0 0 0 0 0 0  PHQ- 9 Score   0 0 0 0 0    Fall Risk    10/25/2022   10:58 AM 08/25/2022    1:43 PM 04/23/2022    3:18 PM 10/09/2021   10:35  AM 04/10/2021    9:32 AM  Fall Risk   Falls in the past year? 1 1 0 0 0  Number falls in past yr: 0 0 0 0 0  Injury with Fall? 0 0 0 0 0  Risk for fall due to :    No Fall Risks No Fall Risks  Follow up    Falls evaluation completed Falls evaluation completed    MEDICARE RISK AT HOME: Medicare Risk at Home Any stairs in or around the home?: Yes If so, are there any without handrails?: No Home free of loose throw rugs in walkways, pet beds, electrical cords, etc?: Yes Adequate lighting in your home to reduce risk of falls?: Yes Life alert?: No Use of a cane, walker or w/c?: Yes Grab bars in the bathroom?: No Shower chair or bench in shower?: Yes Elevated toilet seat or a handicapped toilet?: Yes  TIMED UP AND GO:  Was the test performed?  No    Cognitive Function:    08/25/2022    1:49 PM 02/10/2022    8:02 AM 08/07/2021    8:35 AM 02/04/2021    8:29 AM 09/12/2020    3:06 PM  MMSE - Mini Mental State Exam  Orientation to time 4 2 0 4 3  Orientation to Place 5 5 4 4 5   Registration 3 3 3 3 3   Attention/ Calculation 5 5 5 2 5   Recall 2 3 2 2 2   Language- name 2 objects 2 2 2 2 2   Language- repeat 1 1 1 1 1   Language- follow 3 step command 3 3 3 3 3   Language- read & follow direction 1 1 1 1 1   Write a sentence 1 1 1 1 1   Copy design 1 1 1 1 1   Total score 28 27 23 24 27         10/29/2022    8:47 AM 10/09/2021   10:37 AM 10/03/2020   10:26 AM 09/28/2019    1:21 PM 12/05/2018    2:49 PM  6CIT Screen  What Year? 0 points 4 points 0 points 0 points 0 points  What month? 0 points 3 points 3 points 0 points 0 points  What time? 0 points 3 points 3 points 0 points 0 points  Count back from 20 0 points 0 points 0 points 0 points 0 points  Months in reverse 4 points 0 points 0 points 0 points 0 points  Repeat phrase 6 points 6 points 6 points 0 points 0 points  Total Score 10 points 16 points 12 points 0 points 0 points    Immunizations Immunization History  Administered  Date(s) Administered   COVID-19, mRNA, vaccine(Comirnaty)12  years and older 12/12/2021   Hepatitis A, Adult 12/29/2012   Influenza Split 12/11/2010, 12/01/2011   Influenza Whole 01/21/2007, 01/01/2009, 11/28/2009   Influenza, High Dose Seasonal PF 12/01/2013, 12/14/2014, 12/02/2015, 12/18/2016, 12/14/2017, 11/12/2020, 12/12/2021   Influenza,inj,Quad PF,6+ Mos 12/29/2012, 12/01/2013   Influenza-Unspecified 12/18/2016, 12/14/2019   PFIZER(Purple Top)SARS-COV-2 Vaccination 03/14/2019, 04/03/2019, 11/27/2019   Pfizer Covid-19 Vaccine Bivalent Booster 86yrs & up 11/12/2020   Pneumococcal Conjugate-13 02/08/2014   Pneumococcal Polysaccharide-23 03/02/2005, 01/26/2011   Rsv, Bivalent, Protein Subunit Rsvpref,pf Verdis Frederickson) 12/15/2021   Td 03/02/2000, 12/29/2012   Typhoid Inactivated 10/01/2015   Yellow Fever 10/01/2015   Zoster Recombinant(Shingrix) 12/16/2021, 03/19/2022    TDAP status: Up to date  Flu Vaccine status: Due, Education has been provided regarding the importance of this vaccine. Advised may receive this vaccine at local pharmacy or Health Dept. Aware to provide a copy of the vaccination record if obtained from local pharmacy or Health Dept. Verbalized acceptance and understanding.  Pneumococcal vaccine status: Up to date  Covid-19 vaccine status: Declined, Education has been provided regarding the importance of this vaccine but patient still declined. Advised may receive this vaccine at local pharmacy or Health Dept.or vaccine clinic. Aware to provide a copy of the vaccination record if obtained from local pharmacy or Health Dept. Verbalized acceptance and understanding.  Qualifies for Shingles Vaccine? Yes   Zostavax completed Yes   Shingrix Completed?: Yes  Screening Tests Health Maintenance  Topic Date Due   COVID-19 Vaccine (6 - 2023-24 season) 02/06/2022   INFLUENZA VACCINE  05/31/2023 (Originally 10/01/2022)   DTaP/Tdap/Td (3 - Tdap) 12/30/2022   MAMMOGRAM  07/16/2023    Medicare Annual Wellness (AWV)  10/29/2023   Pneumonia Vaccine 52+ Years old  Completed   DEXA SCAN  Completed   Zoster Vaccines- Shingrix  Completed   HPV VACCINES  Aged Out    Health Maintenance  Health Maintenance Due  Topic Date Due   COVID-19 Vaccine (6 - 2023-24 season) 02/06/2022      Mammogram status: Completed 07/16/22. Repeat every year  Bone Density status: Completed 12/15/19. Results reflect: Bone density results: OSTEOPENIA. Repeat every   years.   Additional Screening:    Vision Screening: Recommended annual ophthalmology exams for early detection of glaucoma and other disorders of the eye. Is the patient up to date with their annual eye exam?  Yes  Who is the provider or what is the name of the office in which the patient attends annual eye exams? Dr Burgess Estelle If pt is not established with a provider, would they like to be referred to a provider to establish care? No .   Dental Screening: Recommended annual dental exams for proper oral hygiene   Community Resource Referral / Chronic Care Management:  CRR required this visit?  No   CCM required this visit?  No     Plan:     I have personally reviewed and noted the following in the patient's chart:   Medical and social history Use of alcohol, tobacco or illicit drugs  Current medications and supplements including opioid prescriptions. Patient is currently taking opioid prescriptions. Information provided to patient regarding non-opioid alternatives. Patient advised to discuss non-opioid treatment plan with their provider. Functional ability and status Nutritional status Physical activity Advanced directives List of other physicians Hospitalizations, surgeries, and ER visits in previous 12 months Vitals Screenings to include cognitive, depression, and falls Referrals and appointments  In addition, I have reviewed and discussed with patient certain preventive protocols, quality metrics, and  best  practice recommendations. A written personalized care plan for preventive services as well as general preventive health recommendations were provided to patient.     Tillie Rung, LPN   05/23/4008   After Visit Summary: (MyChart) Due to this being a telephonic visit, the after visit summary with patients personalized plan was offered to patient via MyChart   Nurse Notes: None

## 2022-10-29 NOTE — Patient Instructions (Signed)
[name] , Thank you for taking time to come for your Medicare Wellness Visit. I appreciate your ongoing commitment to your health goals. Please review the following plan we discussed and let me know if I can assist you in the future.   Referrals/Orders/Follow-Ups/Clinician Recommendations:   This is a list of the screening recommended for you and due dates:  Health Maintenance  Topic Date Due   COVID-19 Vaccine (6 - 2023-24 season) 02/06/2022   Flu Shot  05/31/2023*   DTaP/Tdap/Td vaccine (3 - Tdap) 12/30/2022   Mammogram  07/16/2023   Medicare Annual Wellness Visit  10/29/2023   Pneumonia Vaccine  Completed   DEXA scan (bone density measurement)  Completed   Zoster (Shingles) Vaccine  Completed   HPV Vaccine  Aged Out  *Topic was postponed. The date shown is not the original due date.  Opioid Pain Medicine Management Opioid pain medicines are strong medicines that are used to treat bad or very bad pain. When you take them for a short time, they can help you: Sleep better. Do better in physical therapy. Feel better during the first few days after you get hurt. Recover from surgery. Only take these medicines if a doctor says that you can. You should only take them for a short time. This is because opioids can be very addictive. This means that they are hard to stop taking. The longer you take opioids, the harder it may be to stop taking them. What are the risks? Opioids can cause problems (side effects). Taking them for more than 3 days raises your chance of problems, such as: Trouble pooping (constipation). Feeling sick to your stomach (nausea). Vomiting. Feeling very sleepy. Confusion. Not being able to stop taking the medicine. Breathing problems. Taking opioids for a long time can make it hard for you to do daily tasks. It can also put you at risk for: Car accidents. Depression. Suicide. Heart attack. Taking too much of the medicine (overdose). This can lead to  death. What is a pain treatment plan? A pain treatment plan is a plan made by you and your doctor. Work with your doctor to make a plan for treating your pain. To help you do this: Talk about the goals of your treatment, including: How much pain you might expect to have. How you will manage the pain. Talk about the risks and benefits of taking these medicines for your condition. Remember that a good treatment plan uses more than one approach and lowers the risks of side effects. Tell your doctor about the amount of medicines you take and about any drug or alcohol use. Get your pain medicine prescriptions from only one doctor. Pain can be managed with other treatments. Work with your doctor to find other ways to help your pain, such as: Physical therapy or doing gentle exercises. Counseling. Eating healthy foods. Massage. Meditation. Other pain medicines. How to use opioid pain medicine safely Taking medicine Take your pain medicine exactly as told by your doctor. Take it only when you need it. If your pain is not too bad, you may take less medicine if your doctor allows. If you have no pain, do not take the medicine unless your doctor tells you to take it. If your pain is very bad, do not take more medicine than your doctor told you to take. Call your doctor to know what to do. Write down the times when you take your pain medicine. Look at the times before you take your next dose.  Take other over-the-counter or prescription medicines only as told by your doctor. Keeping yourself and others safe  While you are taking opioids: Do not drive, use machines, or power tools. Do not sign important papers (legal documents). Do not drink alcohol. Do not take sleeping pills. Do not take care of children by yourself. Do not do activities where you need to climb or be in high places, like working on a ladder. Do not go to a lake, river, ocean, swimming pool, or hot tub. Keep your opioids locked  up or in a place where children cannot reach them. Do not share your pain medicine with anyone. Stopping your use of opioids If you have been taking opioids for more than a few weeks, you may need to slowly decrease (taper) how much you take until you stop taking them. Doing this can lower your chance of having symptoms.  Symptoms that come from suddenly stopping the use of opioids include: Pain and cramping in your belly (abdomen). Feeling sick to your stomach (nausea).z Sweating. Feeling very sleepy. Feeling restless. Shaking you cannot control (tremors). Cravings for the medicine. Do not try to stop taking them by yourself. Work with your doctor to stop. Your doctor will help you take less until you are not taking the medicine at all. Getting rid of unused pills Do not save any pills that you did not use. Get rid of the pills by: Taking them to a take-back program in your area. Bringing them to a pharmacy that receives unused pills. Flushing them down the toilet. Check the label or package insert of your medicine to see whether this is safe to do. Throwing them in the trash. Check the label or package insert of your medicine to see whether this is safe to do. If it is safe to throw them out: Take the pills out of their container. Put the pills into a container you can seal. Mix the pills with used coffee grounds, food scraps, dirt, or cat litter. Put this in the trash. Follow these instructions at home: Activity Do exercises as told by your doctor. Avoid doing things that make your pain worse. Return to your normal activities as told by your doctor. Ask your doctor what activities are safe for you. General instructions You may need to take these actions to prevent or treat constipation: Drink enough fluid to keep your pee (urine) pale yellow. Take over-the-counter or prescription medicines. Eat foods that are high in fiber. These include beans, whole grains, and fresh fruits and  vegetables. Limit foods that are high in fat and sugar. These include fried or sweet foods. Keep all follow-up visits. Where to find support If you have been taking opioids for a long time, get help from a local support group or counselor. Ask your doctor about this. Where to find more information Centers for Disease Control and Prevention (CDC): FootballExhibition.com.br [place] Food and Drug Administration (FDA): PumpkinSearch.com.ee Get help right away if: You may have taken too much of an opioid (overdosed). Common symptoms of an overdose: Your breathing is slower or more shallow than normal. You have a very slow heartbeat. Your speech is not normal. You vomit or you feel as if you may vomit. The black centers of your eyes (pupils) are smaller than normal. You have other potential symptoms: You feel very confused. You faint. You are very sleepy. You have cold skin. You have blue lips or fingernails. You have thoughts of harming yourself or harming others. These symptoms may  be an emergency. Get help right away. Call your local emergency services (911 in the [place] Do not wait to see if the symptoms will go away. Do not drive yourself to the hospital. Get help right away if you feel like you may hurt yourself or others, or have thoughts about taking your own life. Go to your nearest emergency room or: Call your local emergency services (911 in the [place] Call the [facility] at (671)692-9756. Call a suicide crisis helpline, such as the National Suicide Prevention Lifeline at 956 413 2686 or 988 in the [place] This is open 24 hours a day. Text the Crisis Text Line at (815)550-5835. Summary Opioid are strong medicines that are used to treat bad or very bad pain. A pain treatment plan is a plan made by you and your doctor. Work with your doctor to make a plan for treating your pain. If you think that you or someone else may have taken too much of an opioid, get help right away. This information  is not intended to replace advice given to you by your health care provider. Make sure you discuss any questions you have with your health care provider. Document Revised: 09/11/2020 Document Reviewed: 05/29/2020 Elsevier Patient Education  2024 [employer]   Advanced directives: (In Chart) A copy of your advanced directives are scanned into your chart should your provider ever need it.  Next Medicare Annual Wellness Visit scheduled for next year: Yes

## 2022-10-30 ENCOUNTER — Ambulatory Visit (HOSPITAL_COMMUNITY)
Admission: RE | Admit: 2022-10-30 | Discharge: 2022-10-30 | Disposition: A | Discharge status: Home / Self Care | Payer: Medicare Other | Source: Ambulatory Visit | Attending: Hematology & Oncology | Admitting: Hematology & Oncology

## 2022-10-30 ENCOUNTER — Other Ambulatory Visit: Payer: Self-pay | Admitting: Hematology & Oncology

## 2022-10-30 ENCOUNTER — Encounter (HOSPITAL_COMMUNITY): Payer: Self-pay

## 2022-10-30 ENCOUNTER — Other Ambulatory Visit: Payer: Self-pay

## 2022-10-30 DIAGNOSIS — I4891 Unspecified atrial fibrillation: Secondary | ICD-10-CM | POA: Diagnosis not present

## 2022-10-30 DIAGNOSIS — Z85118 Personal history of other malignant neoplasm of bronchus and lung: Secondary | ICD-10-CM | POA: Insufficient documentation

## 2022-10-30 DIAGNOSIS — Z170 Estrogen receptor positive status [ER+]: Secondary | ICD-10-CM | POA: Diagnosis not present

## 2022-10-30 DIAGNOSIS — Z7901 Long term (current) use of anticoagulants: Secondary | ICD-10-CM | POA: Insufficient documentation

## 2022-10-30 DIAGNOSIS — Z8673 Personal history of transient ischemic attack (TIA), and cerebral infarction without residual deficits: Secondary | ICD-10-CM | POA: Insufficient documentation

## 2022-10-30 DIAGNOSIS — Z87891 Personal history of nicotine dependence: Secondary | ICD-10-CM | POA: Insufficient documentation

## 2022-10-30 DIAGNOSIS — C50412 Malignant neoplasm of upper-outer quadrant of left female breast: Secondary | ICD-10-CM

## 2022-10-30 DIAGNOSIS — F0390 Unspecified dementia without behavioral disturbance: Secondary | ICD-10-CM | POA: Diagnosis not present

## 2022-10-30 DIAGNOSIS — C7951 Secondary malignant neoplasm of bone: Secondary | ICD-10-CM | POA: Diagnosis not present

## 2022-10-30 DIAGNOSIS — Z4901 Encounter for fitting and adjustment of extracorporeal dialysis catheter: Secondary | ICD-10-CM | POA: Diagnosis not present

## 2022-10-30 HISTORY — PX: IR IMAGING GUIDED PORT INSERTION: IMG5740

## 2022-10-30 MED ORDER — LIDOCAINE-EPINEPHRINE 1 %-1:100000 IJ SOLN
20.0000 mL | Freq: Once | INTRAMUSCULAR | Status: AC
  Administered 2022-10-30: 20 mL via INTRADERMAL

## 2022-10-30 MED ORDER — FENTANYL CITRATE (PF) 100 MCG/2ML IJ SOLN
INTRAMUSCULAR | Status: AC
  Filled 2022-10-30: qty 2

## 2022-10-30 MED ORDER — LIDOCAINE-EPINEPHRINE 1 %-1:100000 IJ SOLN
INTRAMUSCULAR | Status: AC
  Filled 2022-10-30: qty 1

## 2022-10-30 MED ORDER — HEPARIN SOD (PORK) LOCK FLUSH 100 UNIT/ML IV SOLN
500.0000 [IU] | Freq: Once | INTRAVENOUS | Status: AC
  Administered 2022-10-30: 500 [IU] via INTRAVENOUS

## 2022-10-30 MED ORDER — FENTANYL CITRATE (PF) 100 MCG/2ML IJ SOLN
INTRAMUSCULAR | Status: AC | PRN
Start: 2022-10-30 — End: 2022-10-30
  Administered 2022-10-30: 25 ug via INTRAVENOUS

## 2022-10-30 MED ORDER — HEPARIN SOD (PORK) LOCK FLUSH 100 UNIT/ML IV SOLN
INTRAVENOUS | Status: AC
  Filled 2022-10-30: qty 5

## 2022-10-30 MED ORDER — MIDAZOLAM HCL 2 MG/2ML IJ SOLN
INTRAMUSCULAR | Status: AC | PRN
Start: 2022-10-30 — End: 2022-10-30
  Administered 2022-10-30: .5 mg via INTRAVENOUS

## 2022-10-30 MED ORDER — MIDAZOLAM HCL 2 MG/2ML IJ SOLN
INTRAMUSCULAR | Status: AC
  Filled 2022-10-30: qty 2

## 2022-10-30 MED ORDER — SODIUM CHLORIDE 0.9 % IV SOLN: INTRAVENOUS | Status: DC

## 2022-10-30 NOTE — Discharge Instructions (Addendum)
Please call Interventional Radiology clinic [phone] with any questions or concerns.  You may remove your dressing and shower tomorrow.  NO EMLA CREAM/LOTIONS OVER PORT SITE X 2 WEEKS DO NOT REMOVE DRESSING X 24 HOURS    After the procedure, it is common to have: Discomfort at the port insertion site. Bruising on the skin over the port. This should improve over 3-4 days  Follow these instructions at home:  Medication: Do not use Aspirin or ibuprofen products, such as Advil or Motrin, as it may increase bleeding.  You may resume your usual medications as ordered by your doctor. If your doctor prescribed antibiotics, take them as directed. Do not stop taking them just because you feel better. You need to take the full course of antibiotics.  Eating and drinking: Drink plenty of liquids to keep your urine pale yellow You can resume your regular diet as directed by your doctor   Care of the procedure site Follow instructions from your health care provider about how to take care of your port insertion site. Make sure you: After your port is placed, you will get a manufacturer's information card. The card has information about your port. Keep this card with you at all times Make sure to remember what type of port you have Take care of the port as told by your health care provider DO NOT use EMLA cream for 2 weeks after port placement -the cream will remove surgical glue on your incision Wash your hands with soap and water before and after you change your bandage (dressing). If soap and water are not available, use hand sanitizer Change your dressing as told by your health care provider Leave skin glue, or adhesive strips in place. These skin closures may need to stay in place for 2 weeks or longer Check your port insertion site every day for signs of infection. Check for: Redness, swelling, or pain Fluid or blood Warmth Pus or a bad smell  Activity Return to your normal  activities as told by your health care provider. Ask your health care provider what activities are safe for you Do not lift anything that is heavier than 10 lb (4.5 kg), or the limit that you are told, until your health care provider says that it is safe Do not take baths, swim, or use a hot tub until your health care provider approves. Take showers only. Keep all follow-up visits as told by your doctor  Contact a health care provider if: You cannot flush your port with saline as directed, or you cannot draw blood from the port You have a fever or chills You have redness, swelling, or pain around your port insertion site You have fluid or blood coming from your port insertion site Your port insertion site feels warm to the touch You have pus or a bad smell coming from the port insertion site  Get help right away if: You have chest pain or shortness of breath You have bleeding from your port that you cannot control

## 2022-10-30 NOTE — Procedures (Signed)
Interventional Radiology Procedure Note  Procedure: Placement of a right IJ approach single lumen PowerPort.  Tip is positioned at the superior cavoatrial junction and catheter is ready for immediate use.  Complications: None Recommendations:  - Ok to shower tomorrow - Do not submerge for 7 days - Routine line care   Signed,  Jaime S. Wagner, DO   

## 2022-10-30 NOTE — H&P (Signed)
Chief Complaint: Patient was seen in consultation today for [facility] placement at the request of Ennever,Peter R  Referring Physician(s): Ennever,Peter R  Supervising Physician: Gilmer Mor  Patient Status: [facility] - Out-pt  History of Present Illness: Patricia [name] is a 82 y.o. 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 placement.   Patient underwent L5 bone lesion bx on 06/30/21 which showed metastatic carcinoma, has been followed by Dr. 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 Patient received palliative RTX to bone lesion, she presents to [facility] IR for [facility] placement today.   Patient laying in bed, not in acute distress. Husband at bedside.  Denise headache, fever, chills, shortness of breath, cough, chest pain, abdominal pain, nausea ,vomiting, and bleeding.   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 Dx 04/2015 at time of stroke. b. DCCV 06/2015 - did not hold. Amio started then stopped due to QT prolongation.   Chronic diastolic heart failure (HCC) 06/17/2015   CKD (chronic kidney disease), stage III (HCC)    Cough variant asthma    Dementia (HCC)    Diverticulosis    Essential hypertension    GERD (gastroesophageal reflux disease)    Glaucoma    Hiatal hernia    Hyperlipidemia    Hypothyroidism    Internal hemorrhoids    Laryngospasm    Mitral regurgitation    mild to moderate by echo 12/2019 and moderate by TEE 12/2020   Multinodular goiter    Osteopenia    Postmenopausal    Pulmonary HTN (HCC)     Moderate with PASP by echo 2018 likely Group 2 from pulmonary venous HTN from CHF>>normal PAP by echo 12/2019    Radiation 10/22/14-11/23/14   Left Breast   Stricture and stenosis of cervix    Stroke (HCC)    TIAs    Past Surgical History:  Procedure Laterality Date   BREAST LUMPECTOMY Left    BREAST SURGERY     CARDIOVERSION N/A 06/24/2015   Procedure: CARDIOVERSION;  Surgeon: Quintella Reichert, MD;  Location: [facility] ENDOSCOPY;  Service: Cardiovascular;  Laterality: N/A;   CARDIOVERSION N/A 05/19/2016   Procedure: CARDIOVERSION;  Surgeon: Quintella Reichert, MD;  Location: [facility] ENDOSCOPY;  Service: Cardiovascular;  Laterality: N/A;   COLONOSCOPY     EYE SURGERY Bilateral    cataract removal by Dr. Burgess Estelle   FEMUR IM NAIL Left 07/28/2022   Procedure: INTRAMEDULLARY (IM) NAIL FEMORAL;  Surgeon: Marcene Corning, MD;  Location: [facility] ORS;  Service: Orthopedics;  Laterality: Left;   LUNG CANCER SURGERY  2003   resection carcinoid lingula-lt upper lobe   RADIOACTIVE SEED GUIDED PARTIAL MASTECTOMY WITH AXILLARY SENTINEL LYMPH NODE BIOPSY Left 06/26/2014   Procedure: RADIOACTIVE SEED GUIDED PARTIAL MASTECTOMY WITH AXILLARY SENTINEL LYMPH NODE BIOPSY;  Surgeon: Almond Lint, MD;  Location: [facility];  Service: General;  Laterality: Left;   RE-EXCISION OF BREAST LUMPECTOMY Left 08/07/2014   Procedure: RE-EXCISION OF LEFT BREAST LUMPECTOMY;  Surgeon: Almond Lint, MD;  Location: [facility];  Service: General;  Laterality: Left;   RIGHT HEART  CATH N/A 04/27/2016   Procedure: Right Heart Cath;  Surgeon: Laurey Morale, MD;  Location: [facility] INVASIVE CV LAB;  Service: Cardiovascular;  Laterality: N/A;   TEE WITHOUT CARDIOVERSION N/A 01/07/2021   Procedure: TRANSESOPHAGEAL ECHOCARDIOGRAM (TEE);  Surgeon: Elease Hashimoto Deloris Ping, MD;  Location: [facility] ENDOSCOPY;  Service: Cardiovascular;  Laterality: N/A;    Allergies: Combigan [brimonidine tartrate-timolol], Sulfa antibiotics, and Sulfamethoxazole-trimethoprim  Medications: Prior to Admission medications   Medication Sig Start Date End Date Taking?  Authorizing Provider  albuterol (PROVENTIL HFA;VENTOLIN HFA) 108 (90 Base) MCG/ACT inhaler Inhale 2 puffs into the lungs every 6 (six) hours as needed for wheezing or shortness of breath.   Yes [provider]  apixaban (ELIQUIS) 5 MG TABS tablet Take 1 tablet (5 mg total) by mouth 2 (two) times daily. 08/03/22  Yes Turner, Cornelious Bryant, MD  Apoaequorin (PREVAGEN) 10 MG CAPS Take 1 capsule by mouth every morning. 11/28/20  Yes Boscia, Heather E, NP  atorvastatin (LIPITOR) 40 MG tablet Take 1 tablet (40 mg total) by mouth daily before supper. 11/25/21 11/26/22 Yes Turner, Traci R, MD  bimatoprost (LUMIGAN) 0.01 % SOLN Place 1 drop into both eyes 2 (two) times daily.   Yes [provider]  calcium-vitamin D (OSCAL WITH D) 500-200 MG-UNIT tablet Take 1 tablet by mouth daily with breakfast. 11/03/17  Yes Opalski, Gavin Pound, DO  digoxin (LANOXIN) 0.125 MG tablet Take 1 tablet (0.125 mg total) by mouth daily. 09/04/22  Yes Asa Lente, Tessa N, PA-C  fluticasone (FLONASE) 50 MCG/ACT nasal spray 1 spray each nostril following sinus rinses twice daily 02/08/18  Yes Opalski, Deborah, DO  fluticasone-salmeterol (ADVAIR HFA) 230-21 MCG/ACT inhaler USE 2 INHALATIONS TWICE A DAY 06/25/22  Yes Saralyn Pilar A, PA  folic acid (FOLVITE) 400 MCG tablet Take 400 mcg by mouth daily.   Yes [provider]  furosemide (LASIX) 20 MG tablet Take 1 tablet (20 mg total) by mouth 2 (two) times daily. Take additional 20 mg as needed for swelling 08/24/22  Yes Conte, Tessa N, PA-C  gabapentin (NEURONTIN) 400 MG capsule TAKE 1 CAPSULE TWICE A DAY 11/25/21  Yes Abonza, Maritza, PA-C  L-Methylfolate-B12-B6-B2 (CEREFOLIN) 07-31-48-5 MG TABS Take 1 tablet by mouth every morning. 02/10/22  Yes McCue, Shanda Bumps, NP  levETIRAcetam (KEPPRA XR) 500 MG 24 hr tablet TAKE 1 TABLET AT BEDTIME 09/07/22  Yes McCue, Shanda Bumps, NP  loratadine (CLARITIN) 10 MG tablet Take 10 mg by mouth daily.   Yes [provider]  memantine (NAMENDA) 10 MG  tablet TAKE 1 TABLET TWICE A DAY 09/15/22  Yes Micki Riley, MD  metoprolol tartrate (LOPRESSOR) 50 MG tablet Take 1 tablet (50 mg total) by mouth 2 (two) times daily. 09/02/22  Yes Turner, Cornelious Bryant, MD  Multiple Vitamins-Iron (MULTIVITAMIN/IRON) TABS Take 1 tablet by mouth daily.   Yes [provider]  Netarsudil Dimesylate (RHOPRESSA) 0.02 % SOLN Place 1 drop into both eyes at bedtime.   Yes [provider]  potassium chloride SA (KLOR-CON M) 20 MEQ tablet TAKE 1 TABLET TWICE A DAY 07/30/22  Yes Turner, Cornelious Bryant, MD  thyroid (ARMOUR THYROID) 15 MG tablet Take 1 tablet (15 mg total) by mouth daily. Take with 60mg  for a total dose of 75mg  daily. 10/22/22  Yes Saralyn Pilar A, PA  traMADol (ULTRAM) 50 MG tablet Take 1 tablet (50 mg total) by mouth every 6 (six) hours as needed (post op pain). 10/19/22  Yes Josph Macho, MD  Vitamin D, Ergocalciferol, (DRISDOL) 1.25  MG (50000 UNIT) CAPS capsule TAKE 1 CAPSULE WEEKLY 10/28/21  Yes Boscia, Heather E, NP  dexamethasone (DECADRON) 4 MG tablet Take 2 tablets (8 mg) by mouth daily for 3 days starting the day after chemotherapy. Take with food. 07/21/22   Josph Macho, MD  dorzolamide-timolol (COSOPT) 22.3-6.8 MG/ML ophthalmic solution Place 1 drop into both eyes at bedtime.    [provider]  ondansetron (ZOFRAN) 8 MG tablet Take 1 tablet (8 mg total) by mouth every 8 (eight) hours as needed for nausea or vomiting. Start on the third day after chemotherapy. 07/21/22   Josph Macho, MD  OVER THE COUNTER MEDICATION Take 1 tablet by mouth daily. Zyflamend otc herbal supplement    [provider]  pantoprazole (PROTONIX) 40 MG tablet TAKE 1 TABLET DAILY 02/26/22   Carlean Jews, NP  prochlorperazine (COMPAZINE) 10 MG tablet Take 1 tablet (10 mg total) by mouth every 6 (six) hours as needed for nausea or vomiting. 07/21/22   Josph Macho, MD  thyroid (ARMOUR THYROID) 60 MG tablet Take 1 tablet (60 mg total) by mouth  daily. Takes with 15mg  daily. 10/22/22   [name], PA     Family History  Problem Relation Age of Onset   Stroke Mother    Hypertension Mother    Hyperlipidemia Mother    Stroke Father    Transient ischemic attack Father    Hyperlipidemia Father    Hypertension Father    Atrial fibrillation Son    Heart attack Neg Hx     Social History   Socioeconomic History   Marital status: Married    Spouse name: Leonette Most   Number of children: 2   Years of education: Not on file   Highest education level: High school graduate  Occupational History   Occupation: Retired  Tobacco Use   Smoking status: Former    Current packs/day: 0.00    Average packs/day: 0.8 packs/day for 5.0 years (3.8 ttl pk-yrs)    Types: Cigarettes    Start date: 06/14/1961    Quit date: 06/15/1966    Years since quitting: 56.4    Passive exposure: Past   Smokeless tobacco: Never  Vaping Use   Vaping status: Never Used  Substance and Sexual Activity   Alcohol use: Not Currently    Comment: occasional wine   Drug use: No   Sexual activity: Not Currently    Birth control/protection: Post-menopausal  Other Topics Concern   Not on file  Social History Narrative   Retired. Lives with husband.    Right Handed   Drinks 1-2 cups caffeine daily   Social Determinants of Health   Financial Resource Strain: Low Risk  (10/25/2022)   Overall Financial Resource Strain (CARDIA)    Difficulty of Paying Living Expenses: Not hard at all  Food Insecurity: No Food Insecurity (10/25/2022)   Hunger Vital Sign    Worried About Running Out of Food in the Last Year: Never true    Ran Out of Food in the Last Year: Never true  Transportation Needs: No Transportation Needs (10/25/2022)   PRAPARE - Administrator, Civil Service (Medical): No    Lack of Transportation (Non-Medical): No  Physical Activity: Inactive (10/25/2022)   Exercise Vital Sign    Days of Exercise per Week: 0 days    Minutes of Exercise per  Session: 0 min  Stress: No Stress Concern Present (10/25/2022)   [employer] of Occupational Health - Occupational  Stress Questionnaire    Feeling of Stress : Not at all  Social Connections: Unknown (10/25/2022)   Social Connection and Isolation Panel [NHANES]    Frequency of Communication with Friends and Family: Twice a week    Frequency of Social Gatherings with Friends and Family: Twice a week    Attends Religious Services: Not on Marketing executive or Organizations: Yes    Attends Engineer, structural: More than 4 times per year    Marital Status: Married     Review of Systems: A 12 point ROS discussed and pertinent positives are indicated in the HPI above.  All other systems are negative.  Vital Signs: BP 132/73   Pulse 87   Temp 97.7 F (36.5 C) (Oral)   Resp 18   Ht 5' (1.524 m)   Wt 140 lb (63.5 kg)   SpO2 98%   BMI 27.34 kg/m    Physical Exam Vitals reviewed.  Constitutional:      General: She is not in acute distress.    Appearance: She is not ill-appearing.  HENT:     Head: Normocephalic and atraumatic.     Mouth/Throat:     Mouth: Mucous membranes are moist.     Pharynx: Oropharynx is clear.  Cardiovascular:     Rate and Rhythm: Normal rate. Rhythm irregular.     Heart sounds: Normal heart sounds.  Pulmonary:     Effort: Pulmonary effort is normal.     Breath sounds: Normal breath sounds.  Abdominal:     General: Abdomen is flat. Bowel sounds are normal.     Palpations: Abdomen is soft.  Musculoskeletal:     Cervical back: Neck supple.  Skin:    General: Skin is warm and dry.     Coloration: Skin is not jaundiced or pale.  Neurological:     Mental Status: She is alert and oriented to person, place, and time.  Psychiatric:        Mood and Affect: Mood normal.        Behavior: Behavior normal.        Judgment: Judgment normal.     MD Evaluation Airway: WNL Heart: WNL Abdomen: WNL Chest/ Lungs: WNL ASA   Classification: 3 Mallampati/Airway Score: Two  Imaging: US THORACENTESIS ASP PLEURAL SPACE W/IMG GUIDE  Result Date: 10/13/2022 INDICATION: Patient with history of metastatic breast cancer,heart failure, left pleural effusion. Request received for diagnostic and therapeutic left thoracentesis. EXAM: ULTRASOUND GUIDED DIAGNOSTIC AND THERAPEUTIC LEFT THORACENTESIS MEDICATIONS: 8 ml 1% lidocaine COMPLICATIONS: None immediate. PROCEDURE: An ultrasound guided thoracentesis was thoroughly discussed with the patient/spouse and questions answered. The benefits, risks, alternatives and complications were also discussed. The patient/spouse understands and wishes to proceed with the procedure. Written consent was obtained. Ultrasound was performed to localize and mark an adequate pocket of fluid in the left chest. The area was then prepped and draped in the normal sterile fashion. 1% Lidocaine was used for local anesthesia. Under ultrasound guidance a 6 Fr Safe-T-Centesis catheter was introduced. Thoracentesis was performed. The catheter was removed and a dressing applied. FINDINGS: A total of approximately 450 cc of yellow fluid was removed. Samples were sent to the laboratory as requested by the clinical team. IMPRESSION: Successful ultrasound guided diagnostic and therapeutic left thoracentesis yielding 450 cc of pleural fluid. Performed by: Artemio Aly Electronically Signed   By: Judie Petit.  Shick M.D.   On: 10/13/2022 12:18   DG Chest 1 View  Result  Date: 10/13/2022 CLINICAL DATA:  82 year old female status post thoracentesis EXAM: CHEST  1 VIEW COMPARISON:  08/13/2022 FINDINGS: Cardiomediastinal silhouette unchanged in size and contour. Low lung volumes. No pneumothorax. Improved aeration at the left lung base with no large pleural effusion. Reticulonodular opacities at the bilateral lung bases, unchanged from the prior plain film. Surgical changes of left chest. No displaced fracture. IMPRESSION: No  pneumothorax status post left thoracentesis Electronically Signed   By: Gilmer Mor D.O.   On: 10/13/2022 12:05   NM PET Image Restag (PS) Skull Base To Thigh  Result Date: 10/09/2022 CLINICAL DATA:  Subsequent treatment strategy for LEFT breast carcinoma. EXAM: NUCLEAR MEDICINE PET SKULL BASE TO THIGH TECHNIQUE: 7.1 mCi F-18 FDG was injected intravenously. Full-ring PET imaging was performed from the skull base to thigh after the radiotracer. CT data was obtained and used for attenuation correction and anatomic localization. Fasting blood glucose: 102 mg/dl COMPARISON:  PET-CT 46/96/2952 FINDINGS: Mediastinal blood pool activity: SUV max 2.5 Liver activity: SUV max NA NECK: No hypermetabolic lymph nodes in the neck. Incidental CT findings: None. CHEST: New focus metabolic activity inferior RIGHT lower lobe on image 79. There is mild ground-glass density at this site. Favor small focus of pulmonary infection or inflammation. No suspicious pulmonary nodules otherwise. Moderate to large LEFT pleural effusion slightly increased in volume compared to prior. Postsurgical change in the LEFT breast. No abnormal metabolic activity. No hypermetabolic axillary nodes. Incidental CT findings: None. ABDOMEN/PELVIS: No abnormal hypermetabolic activity within the liver, pancreas, adrenal glands, or spleen. No hypermetabolic lymph nodes in the abdomen or pelvis. Incidental CT findings: None. SKELETON: Interval improvement skeletal metastasis. Hypermetabolic lesions in the medial LEFT clavicle and RIGHT pubic symphysis have resolved. Mild activity in the RIGHT sacral ala with SUV max 3.6 similar prior (SUV max equal 3.8). Incidental CT findings: None. IMPRESSION: 1. Interval improvement in skeletal metastasis with resolution of hypermetabolic lesions in the medial LEFT clavicle and RIGHT pubic symphysis. 2. Stable mild metabolic activity in the RIGHT sacral ala. 3. No new sites of skeletal metastasis. 4. New focus of metabolic  activity in the inferior RIGHT lower lobe with mild ground-glass density. Favor small focus of pulmonary infection or inflammation. 5. Moderate to large LEFT pleural effusion slightly increased in volume compared to prior. 6. Postsurgical change in the LEFT breast. No evidence of local recurrence in the LEFT breast. 7. No evidence of visceral metastasis in the abdomen pelvis. Electronically Signed   By: Genevive Bi M.D.   On: 10/09/2022 15:01    Labs:  CBC: Recent Labs    08/24/22 1002 09/07/22 1205 09/28/22 1231 10/19/22 1206  WBC 7.4 7.8 4.4 4.7  HGB 11.6 12.7 12.4 12.5  HCT 36.5 39.7 38.2 38.9  PLT 423 271 291 285    COAGS: No results for input(s): "INR", "APTT" in the last 8760 hours.  BMP: Recent Labs    08/16/22 0420 08/24/22 1002 09/07/22 1205 09/28/22 1231 10/19/22 1206  NA 138 141 141 139 139  K 3.9 4.8 4.1 4.1 4.5  CL 109 108* 106 105 104  CO2 22 21 26 24 27   GLUCOSE 104* 113* 135* 128* 118*  BUN 14 18 27* 21 23  CALCIUM 7.9* 8.4* 9.9 9.0 8.6*  CREATININE 0.78 0.83 0.87 0.80 0.91  GFRNONAA >60  --  >60 >60 >60    LIVER FUNCTION TESTS: Recent Labs    08/24/22 1002 09/07/22 1205 09/28/22 1231 10/19/22 1206  BILITOT 0.5 0.5 0.5 0.4  AST  24 23 20 20   ALT 22 19 20 19   ALKPHOS 169* 106 98 83  PROT 6.0 7.1 6.4* 6.4*  ALBUMIN 3.6* 4.0 3.9 3.9    TUMOR MARKERS: No results for input(s): "AFPTM", "CEA", "CA199", "CHROMGRNA" in the last 8760 hours.  Assessment and Plan: 82 y.o. female with left breast CA who is in need of long term CVC.   NPO since MN VSS On Eliquis no need for d/c  Allergies reviewed   Risks and benefits of image guided port-a-catheter placement was discussed with the patient including, but not limited to bleeding, infection, pneumothorax, or fibrin sheath development and need for additional procedures.  All of the patient's questions were answered, patient is agreeable to proceed. Consent signed and in chart.   Thank you for  this interesting consult.  I greatly enjoyed meeting Leahrose Mcneal and look forward to participating in their care.  A copy of this report was sent to the requesting provider on this date.  Electronically Signed: Willette Brace, PA-C 10/30/2022, 1:37 PM   I spent a total of  30 Minutes   in face to face in clinical consultation, greater than 50% of which was counseling/coordinating care for [facility] placement.   This chart was dictated using voice recognition software.  Despite best efforts to proofread,  errors can occur which can change the documentation meaning.

## 2022-11-01 ENCOUNTER — Other Ambulatory Visit: Payer: Self-pay | Admitting: Nurse Practitioner

## 2022-11-01 DIAGNOSIS — E559 Vitamin D deficiency, unspecified: Secondary | ICD-10-CM

## 2022-11-04 ENCOUNTER — Ambulatory Visit: Payer: Medicare Other | Admitting: Physician Assistant

## 2022-11-04 ENCOUNTER — Other Ambulatory Visit: Payer: Self-pay | Admitting: Family Medicine

## 2022-11-04 DIAGNOSIS — E032 Hypothyroidism due to medicaments and other exogenous substances: Secondary | ICD-10-CM

## 2022-11-04 DIAGNOSIS — I10 Essential (primary) hypertension: Secondary | ICD-10-CM

## 2022-11-04 DIAGNOSIS — E559 Vitamin D deficiency, unspecified: Secondary | ICD-10-CM

## 2022-11-09 ENCOUNTER — Inpatient Hospital Stay: Payer: Medicare Other | Attending: Hematology & Oncology

## 2022-11-09 ENCOUNTER — Encounter: Payer: Self-pay | Admitting: Hematology & Oncology

## 2022-11-09 ENCOUNTER — Inpatient Hospital Stay: Payer: Medicare Other

## 2022-11-09 ENCOUNTER — Inpatient Hospital Stay (HOSPITAL_BASED_OUTPATIENT_CLINIC_OR_DEPARTMENT_OTHER): Payer: Medicare Other | Admitting: Hematology & Oncology

## 2022-11-09 ENCOUNTER — Other Ambulatory Visit: Payer: Self-pay

## 2022-11-09 ENCOUNTER — Other Ambulatory Visit: Payer: Self-pay | Admitting: *Deleted

## 2022-11-09 ENCOUNTER — Encounter: Payer: Self-pay | Admitting: *Deleted

## 2022-11-09 VITALS — BP 128/104 | HR 89 | Temp 97.7°F | Resp 20 | Ht 60.0 in | Wt 139.1 lb

## 2022-11-09 VITALS — BP 137/64 | HR 82

## 2022-11-09 DIAGNOSIS — Z853 Personal history of malignant neoplasm of breast: Secondary | ICD-10-CM | POA: Insufficient documentation

## 2022-11-09 DIAGNOSIS — M810 Age-related osteoporosis without current pathological fracture: Secondary | ICD-10-CM

## 2022-11-09 DIAGNOSIS — Z171 Estrogen receptor negative status [ER-]: Secondary | ICD-10-CM | POA: Insufficient documentation

## 2022-11-09 DIAGNOSIS — C7951 Secondary malignant neoplasm of bone: Secondary | ICD-10-CM

## 2022-11-09 DIAGNOSIS — Z170 Estrogen receptor positive status [ER+]: Secondary | ICD-10-CM

## 2022-11-09 DIAGNOSIS — M8580 Other specified disorders of bone density and structure, unspecified site: Secondary | ICD-10-CM

## 2022-11-09 DIAGNOSIS — Z5112 Encounter for antineoplastic immunotherapy: Secondary | ICD-10-CM | POA: Insufficient documentation

## 2022-11-09 DIAGNOSIS — C50412 Malignant neoplasm of upper-outer quadrant of left female breast: Secondary | ICD-10-CM | POA: Diagnosis not present

## 2022-11-09 DIAGNOSIS — Z95828 Presence of other vascular implants and grafts: Secondary | ICD-10-CM

## 2022-11-09 LAB — CMP (CANCER CENTER ONLY)
ALT: 26 U/L (ref 0–44)
AST: 23 U/L (ref 15–41)
Albumin: 3.7 g/dL (ref 3.5–5.0)
Alkaline Phosphatase: 76 U/L (ref 38–126)
Anion gap: 8 (ref 5–15)
BUN: 20 mg/dL (ref 8–23)
CO2: 26 mmol/L (ref 22–32)
Calcium: 9.1 mg/dL (ref 8.9–10.3)
Chloride: 105 mmol/L (ref 98–111)
Creatinine: 0.81 mg/dL (ref 0.44–1.00)
GFR, Estimated: >60 mL/min (ref >60)
Glucose, Bld: 142 mg/dL — ABNORMAL HIGH (ref 70–99)
Potassium: 4.1 mmol/L (ref 3.5–5.1)
Sodium: 139 mmol/L (ref 135–145)
Total Bilirubin: 0.4 mg/dL (ref 0.3–1.2)
Total Protein: 6.3 g/dL — ABNORMAL LOW (ref 6.5–8.1)

## 2022-11-09 LAB — CBC WITH DIFFERENTIAL (CANCER CENTER ONLY)
Abs Immature Granulocytes: 0.27 10*3/uL — ABNORMAL HIGH (ref 0.00–0.07)
Basophils Absolute: 0 10*3/uL (ref 0.0–0.1)
Basophils Relative: 1 %
Eosinophils Absolute: 0 10*3/uL (ref 0.0–0.5)
Eosinophils Relative: 0 %
HCT: 34.2 % — ABNORMAL LOW (ref 36.0–46.0)
Hemoglobin: 11.2 g/dL — ABNORMAL LOW (ref 12.0–15.0)
Immature Granulocytes: 5 %
Lymphocytes Relative: 18 %
Lymphs Abs: 1 10*3/uL (ref 0.7–4.0)
MCH: 32.7 pg (ref 26.0–34.0)
MCHC: 32.7 g/dL (ref 30.0–36.0)
MCV: 99.7 fL (ref 80.0–100.0)
Monocytes Absolute: 0.8 10*3/uL (ref 0.1–1.0)
Monocytes Relative: 14 %
Neutro Abs: 3.8 10*3/uL (ref 1.7–7.7)
Neutrophils Relative %: 62 %
Platelet Count: 324 10*3/uL (ref 150–400)
RBC: 3.43 MIL/uL — ABNORMAL LOW (ref 3.87–5.11)
RDW: 18 % — ABNORMAL HIGH (ref 11.5–15.5)
WBC Count: 5.9 10*3/uL (ref 4.0–10.5)
nRBC: 0.3 % — ABNORMAL HIGH (ref 0.0–0.2)

## 2022-11-09 LAB — LACTATE DEHYDROGENASE: LDH: 305 U/L — ABNORMAL HIGH (ref 98–192)

## 2022-11-09 MED ORDER — ACETAMINOPHEN 325 MG PO TABS
650.0000 mg | ORAL_TABLET | Freq: Once | ORAL | Status: AC
  Administered 2022-11-09: 650 mg via ORAL
  Filled 2022-11-09: qty 2

## 2022-11-09 MED ORDER — SODIUM CHLORIDE 0.9 % IV SOLN
150.0000 mg | Freq: Once | INTRAVENOUS | Status: AC
  Administered 2022-11-09: 150 mg via INTRAVENOUS
  Filled 2022-11-09: qty 150

## 2022-11-09 MED ORDER — DEXTROSE 5 % IV SOLN: Freq: Once | INTRAVENOUS | Status: AC

## 2022-11-09 MED ORDER — DEXAMETHASONE 4 MG PO TABS: ORAL_TABLET | ORAL | 1 refills | Status: DC

## 2022-11-09 MED ORDER — LIDOCAINE-PRILOCAINE 2.5-2.5 % EX CREA
TOPICAL_CREAM | CUTANEOUS | 3 refills | Status: DC
Start: 2022-11-09 — End: 2023-09-09

## 2022-11-09 MED ORDER — DENOSUMAB 120 MG/1.7ML ~~LOC~~ SOLN
120.0000 mg | Freq: Once | SUBCUTANEOUS | Status: AC
  Administered 2022-11-09: 120 mg via SUBCUTANEOUS
  Filled 2022-11-09: qty 1.7

## 2022-11-09 MED ORDER — SODIUM CHLORIDE 0.9 % IV SOLN
10.0000 mg | Freq: Once | INTRAVENOUS | Status: AC
  Administered 2022-11-09: 10 mg via INTRAVENOUS
  Filled 2022-11-09: qty 10

## 2022-11-09 MED ORDER — DIPHENHYDRAMINE HCL 25 MG PO CAPS
50.0000 mg | ORAL_CAPSULE | Freq: Once | ORAL | Status: AC
  Administered 2022-11-09: 50 mg via ORAL
  Filled 2022-11-09: qty 2

## 2022-11-09 MED ORDER — FAM-TRASTUZUMAB DERUXTECAN-NXKI CHEMO 100 MG IV SOLR
350.0000 mg | Freq: Once | INTRAVENOUS | Status: AC
  Administered 2022-11-09: 360 mg via INTRAVENOUS
  Filled 2022-11-09: qty 18

## 2022-11-09 MED ORDER — HEPARIN SOD (PORK) LOCK FLUSH 100 UNIT/ML IV SOLN
500.0000 [IU] | Freq: Once | INTRAVENOUS | Status: AC | PRN
  Administered 2022-11-09: 500 [IU]

## 2022-11-09 MED ORDER — PALONOSETRON HCL INJECTION 0.25 MG/5ML
0.2500 mg | Freq: Once | INTRAVENOUS | Status: AC
  Administered 2022-11-09: 0.25 mg via INTRAVENOUS
  Filled 2022-11-09: qty 5

## 2022-11-09 MED ORDER — SODIUM CHLORIDE 0.9% FLUSH
10.0000 mL | INTRAVENOUS | Status: DC | PRN
  Administered 2022-11-09: 10 mL

## 2022-11-09 NOTE — Progress Notes (Signed)
Hematology and Oncology Follow Up Visit  Lasharon Billups [id number] [birth year] 82 y.o. 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 Odegaard is back for follow-up.  She is still managing pretty well.  She really has no specific complaints.  Her last CA 27.29 was down to 27.  She seems to be more active.  She has had no problems with cough or shortness of breath.  She has had no bleeding.  There is been no change in bowel or bladder habits.  She has had no rashes.  There has been no leg swelling.  I am just happy that her quality of life is doing pretty well right now.  I know she has a cardiac issues.  I really do not hear any atrial fibrillation today.  She continues on anticoagulation.  Currently, I would have said that her performance status is ECOG 1-2.   Wt Readings from Last 3 Encounters:  11/09/22 139 lb 1.9 oz (63.1 kg)  10/30/22 140 lb (63.5 kg)  10/29/22 143 lb (64.9 kg)     Medications:  Current Outpatient Medications:    albuterol (PROVENTIL HFA;VENTOLIN HFA) 108 (90 Base) MCG/ACT inhaler, Inhale 2 puffs into the lungs every 6 (six) hours as needed for wheezing or shortness of breath., Disp: , Rfl:    apixaban (ELIQUIS) 5 MG TABS tablet, Take 1 tablet (5 mg total) by mouth 2 (two) times daily., Disp: 180 tablet, Rfl: 3   Apoaequorin (PREVAGEN) 10 MG CAPS, Take 1 capsule by mouth every morning., Disp: 90 capsule, Rfl: 3   atorvastatin (LIPITOR) 40 MG tablet, Take 1 tablet (40 mg total) by mouth daily before supper., Disp: 90 tablet, Rfl: 3   bimatoprost (LUMIGAN) 0.01 % SOLN, Place 1 drop into both eyes 2 (two) times daily.,  Disp: , Rfl:    calcium-vitamin D (OSCAL WITH D) 500-200 MG-UNIT tablet, Take 1 tablet by mouth daily with breakfast., Disp: 30 tablet, Rfl: 0   dexamethasone (DECADRON) 4 MG tablet, Take 2 tablets (8 mg) by mouth daily for 3 days starting the day after chemotherapy. Take with food., Disp: 30 tablet, Rfl: 1   digoxin (LANOXIN) 0.125 MG tablet, Take 1 tablet (0.125 mg total) by mouth daily., Disp: 90 tablet, Rfl: 3   dorzolamide-timolol (COSOPT) 22.3-6.8 MG/ML ophthalmic solution, Place 1 drop into both eyes at bedtime., Disp: , Rfl:    fluticasone (FLONASE) 50 MCG/ACT nasal spray, 1 spray each nostril following sinus rinses twice daily, Disp: 16 g, Rfl: 2   fluticasone-salmeterol (ADVAIR HFA) 230-21 MCG/ACT inhaler, USE 2 INHALATIONS TWICE A DAY, Disp: 36 g, Rfl: 3   folic acid (FOLVITE) 400 MCG tablet, Take 400 mcg by mouth daily., Disp: , Rfl:    furosemide (LASIX) 20 MG tablet, Take 1 tablet (20 mg total) by mouth 2 (two) times daily. Take additional 20 mg as needed for swelling, Disp: 180 tablet, Rfl: 2   gabapentin (NEURONTIN) 400 MG capsule, TAKE 1 CAPSULE TWICE A DAY, Disp: 180 capsule, Rfl: 3   L-Methylfolate-B12-B6-B2 (CEREFOLIN) 07-31-48-5 MG TABS, Take 1 tablet by mouth every morning., Disp: 90 tablet, Rfl: 3   levETIRAcetam (KEPPRA XR) 500 MG  24 hr tablet, TAKE 1 TABLET AT BEDTIME, Disp: 90 tablet, Rfl: 3   loratadine (CLARITIN) 10 MG tablet, Take 10 mg by mouth daily., Disp: , Rfl:    memantine (NAMENDA) 10 MG tablet, TAKE 1 TABLET TWICE A DAY, Disp: 180 tablet, Rfl: 3   metoprolol tartrate (LOPRESSOR) 50 MG tablet, Take 1 tablet (50 mg total) by mouth 2 (two) times daily., Disp: 180 tablet, Rfl: 3   Multiple Vitamins-Iron (MULTIVITAMIN/IRON) TABS, Take 1 tablet by mouth daily., Disp: , Rfl:    Netarsudil Dimesylate (RHOPRESSA) 0.02 % SOLN, Place 1 drop into both eyes at bedtime., Disp: , Rfl:    ondansetron (ZOFRAN) 8 MG tablet, Take 1 tablet (8 mg total) by mouth every 8 (eight) hours  as needed for nausea or vomiting. Start on the third day after chemotherapy., Disp: 30 tablet, Rfl: 1   OVER THE COUNTER MEDICATION, Take 1 tablet by mouth daily. Zyflamend otc herbal supplement, Disp: , Rfl:    pantoprazole (PROTONIX) 40 MG tablet, TAKE 1 TABLET DAILY, Disp: 90 tablet, Rfl: 3   potassium chloride SA (KLOR-CON M) 20 MEQ tablet, TAKE 1 TABLET TWICE A DAY, Disp: 180 tablet, Rfl: 3   prochlorperazine (COMPAZINE) 10 MG tablet, Take 1 tablet (10 mg total) by mouth every 6 (six) hours as needed for nausea or vomiting., Disp: 30 tablet, Rfl: 1   thyroid (ARMOUR THYROID) 15 MG tablet, Take 1 tablet (15 mg total) by mouth daily. Take with 60mg  for a total dose of 75mg  daily., Disp: 90 tablet, Rfl: 1   thyroid (ARMOUR THYROID) 60 MG tablet, Take 1 tablet (60 mg total) by mouth daily. Takes with 15mg  daily., Disp: 90 tablet, Rfl: 1   traMADol (ULTRAM) 50 MG tablet, Take 1 tablet (50 mg total) by mouth every 6 (six) hours as needed (post op pain)., Disp: 60 tablet, Rfl: 0   Vitamin D, Ergocalciferol, (DRISDOL) 1.25 MG (50000 UNIT) CAPS capsule, TAKE 1 CAPSULE WEEKLY, Disp: 12 capsule, Rfl: 3 No current facility-administered medications for this visit.  Facility-Administered Medications Ordered in Other Visits:    acetaminophen (TYLENOL) tablet 650 mg, 650 mg, Oral, Once, Dwanna Goshert, Rose Phi, MD   dexamethasone (DECADRON) 10 mg in sodium chloride 0.9 % 50 mL IVPB, 10 mg, Intravenous, Once, Himani Corona, Rose Phi, MD   dextrose 5 % solution, , Intravenous, Once, Kristi Norment, Rose Phi, MD   diphenhydrAMINE (BENADRYL) capsule 50 mg, 50 mg, Oral, Once, Anacaren Kohan, Rose Phi, MD   fosaprepitant (EMEND) 150 mg in sodium chloride 0.9 % 145 mL IVPB, 150 mg, Intravenous, Once, Verley Pariseau, Rose Phi, MD   palonosetron (ALOXI) injection 0.25 mg, 0.25 mg, Intravenous, Once, Constantine Ruddick, Rose Phi, MD  Allergies:  Allergies  Allergen Reactions   Combigan [Brimonidine Tartrate-Timolol] Itching    Per patient made eyes red, sore, and  sensitivity to light   Sulfa Antibiotics Hives and Rash   Sulfamethoxazole-Trimethoprim Hives    Past Medical History, Surgical history, Social history, and Family History were reviewed and updated.  Review of Systems: Review of Systems  Constitutional:  Positive for fatigue. Negative for appetite change.  HENT:  Negative.    Eyes: Negative.   Respiratory:  Positive for shortness of breath. Negative for chest tightness.   Cardiovascular: Negative.   Gastrointestinal: Negative.   Endocrine: Negative.   Genitourinary: Negative.    Musculoskeletal:  Positive for gait problem.  Skin: Negative.   Neurological:  Positive for dizziness and gait problem. Negative for headaches and light-headedness.  Hematological: Negative.  Psychiatric/Behavioral: Negative.      Physical Exam:  height is 5' (1.524 m) and weight is 139 lb 1.9 oz (63.1 kg). Her oral temperature is 97.7 F (36.5 C). Her blood pressure is 128/104 (abnormal) and her pulse is 89. Her respiration is 20 and oxygen saturation is 96%.   Wt Readings from Last 3 Encounters:  11/09/22 139 lb 1.9 oz (63.1 kg)  10/30/22 140 lb (63.5 kg)  10/29/22 143 lb (64.9 kg)    Physical Exam Vitals reviewed.  HENT:     Head: Normocephalic and atraumatic.  Eyes:     Pupils: Pupils are equal, round, and reactive to light.  Cardiovascular:     Rate and Rhythm: Normal rate and regular rhythm.     Heart sounds: Normal heart sounds.  Pulmonary:     Effort: Pulmonary effort is normal.     Breath sounds: Normal breath sounds.  Abdominal:     General: Bowel sounds are normal.     Palpations: Abdomen is soft.  Musculoskeletal:        General: No tenderness or deformity. Normal range of motion.     Cervical back: Normal range of motion.     Left lower leg: Edema present.     Comments: There is mild edema in the legs bilaterally.  She has good pulses in the distal extremities.  She has decent range of motion of her joints.    Lymphadenopathy:     Cervical: No cervical adenopathy.  Skin:    General: Skin is warm and dry.     Findings: No erythema or rash.  Neurological:     Mental Status: She is alert and oriented to person, place, and time.  Psychiatric:        Behavior: Behavior normal.        Thought Content: Thought content normal.        Judgment: Judgment normal.      Lab Results  Component Value Date   WBC 4.7 10/19/2022   HGB 12.5 10/19/2022   HCT 38.9 10/19/2022   MCV 101.3 (H) 10/19/2022   PLT 285 10/19/2022     Chemistry      Component Value Date/Time   NA 139 10/19/2022 1206   NA 141 08/24/2022 1002   NA 141 09/22/2016 1459   K 4.5 10/19/2022 1206   K 3.8 09/22/2016 1459   CL 104 10/19/2022 1206   CO2 27 10/19/2022 1206   CO2 26 09/22/2016 1459   BUN 23 10/19/2022 1206   BUN 18 08/24/2022 1002   BUN 19.8 09/22/2016 1459   CREATININE 0.91 10/19/2022 1206   CREATININE 1.1 09/22/2016 1459      Component Value Date/Time   CALCIUM 8.6 (L) 10/19/2022 1206   CALCIUM 9.2 09/22/2016 1459   ALKPHOS 83 10/19/2022 1206   ALKPHOS 98 09/22/2016 1459   AST 20 10/19/2022 1206   AST 15 09/22/2016 1459   ALT 19 10/19/2022 1206   ALT 21 09/22/2016 1459   BILITOT 0.4 10/19/2022 1206   BILITOT 0.65 09/22/2016 1459      Impression and Plan: Ms. Shirar is a very nice 82 year old postmenopausal white female.  She has a solitary metastatic focus from breast cancer.  Her initial breast cancer was probably 7 years ago.  It was estrogen positive. 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  The HER2 was equivocal on initial staining but on FISH was negative.  She really is doing nicely on the Enhertu.  I am just  happy that she has had no complications from the Enhertu.  I will go ahead and plan to get her back in 3 more weeks.  She will go ahead and get her Prolia today.  I do not think we need any scans probably till October or November.     Josph Macho, MD 9/9/20241:18 PM

## 2022-11-09 NOTE — Patient Instructions (Signed)
[facility]  Discharge Instructions: Thank you for choosing [facility] to provide your oncology and hematology care.   If you have a lab appointment with the Cancer Center, please go directly to the Cancer Center and check in at the registration area.  Wear comfortable clothing and clothing appropriate for easy access to any Portacath or PICC line.   We strive to give you quality time with your provider. You may need to reschedule your appointment if you arrive late (15 or more minutes).  Arriving late affects you and other patients whose appointments are after yours.  Also, if you miss three or more appointments without notifying the office, you may be dismissed from the clinic at the provider's discretion.      For prescription refill requests, have your pharmacy contact our office and allow 72 hours for refills to be completed.    Today you received the following chemotherapy and/or immunotherapy agents fam-Trastuzumab/Xegva      To help prevent nausea and vomiting after your treatment, we encourage you to take your nausea medication as directed.  BELOW ARE SYMPTOMS THAT SHOULD BE REPORTED IMMEDIATELY: *FEVER GREATER THAN 100.4 F (38 C) OR HIGHER *CHILLS OR SWEATING *NAUSEA AND VOMITING THAT IS NOT CONTROLLED WITH YOUR NAUSEA MEDICATION *UNUSUAL SHORTNESS OF BREATH *UNUSUAL BRUISING OR BLEEDING *URINARY PROBLEMS (pain or burning when urinating, or frequent urination) *BOWEL PROBLEMS (unusual diarrhea, constipation, pain near the anus) TENDERNESS IN MOUTH AND THROAT WITH OR WITHOUT PRESENCE OF ULCERS (sore throat, sores in mouth, or a toothache) UNUSUAL RASH, SWELLING OR PAIN  UNUSUAL VAGINAL DISCHARGE OR ITCHING   Items with * indicate a potential emergency and should be followed up as soon as possible or go to the Emergency Department if any problems should occur.  Please show the CHEMOTHERAPY ALERT CARD or IMMUNOTHERAPY ALERT  CARD at check-in to the Emergency Department and triage nurse. Should you have questions after your visit or need to cancel or reschedule your appointment, please contact [facility]  [phone] and follow the prompts.  Office hours are 8:00 a.m. to 4:30 p.m. Monday - Friday. Please note that voicemails left after 4:00 p.m. may not be returned until the following business day.  We are closed weekends and major holidays. You have access to a nurse at all times for urgent questions. Please call the main number to the clinic [phone] and follow the prompts.  For any non-urgent questions, you may also contact your provider using MyChart. We now offer e-Visits for anyone 30 and older to request care online for non-urgent symptoms. For details visit [email or web address].   Also download the MyChart app! Go to the app store, search "MyChart", open the app, select [facility], and log in with your MyChart username and password.

## 2022-11-09 NOTE — Progress Notes (Signed)
Patient tolerating treatment well with clinical improvement. Will continue and proceed with next cycle.   Oncology Nurse Navigator Documentation     11/09/2022    1:15 PM  Oncology Nurse Navigator Flowsheets  Navigator Follow Up Date: 11/30/2022  Navigator Follow Up Reason: Follow-up Appointment;Chemotherapy  Navigator Location [facility]  Navigator Encounter Type Treatment;Appt/Treatment Plan Review  Patient Visit Type MedOnc  Treatment Phase Active Tx  Barriers/Navigation Needs No Barriers At This Time  Interventions Psycho-Social Support  Acuity Level 1-No Barriers  Support Groups/Services Friends and Family  Time Spent with Patient 15

## 2022-11-09 NOTE — Progress Notes (Signed)
Diphenhydramine premedication dose decreased to 25 mg for future cycles per Dr. Gustavo Lah instructions. Patient is 82 years old and has had no adverse reactions to Enhertu.

## 2022-11-10 LAB — CANCER ANTIGEN 27.29: CA 27.29: 27 U/mL (ref 0.0–38.6)

## 2022-11-13 ENCOUNTER — Encounter: Payer: Self-pay | Admitting: Hematology & Oncology

## 2022-11-15 ENCOUNTER — Encounter: Payer: Self-pay | Admitting: Family Medicine

## 2022-11-15 DIAGNOSIS — E559 Vitamin D deficiency, unspecified: Secondary | ICD-10-CM

## 2022-11-17 ENCOUNTER — Other Ambulatory Visit: Payer: Self-pay | Admitting: Family Medicine

## 2022-11-17 DIAGNOSIS — R053 Chronic cough: Secondary | ICD-10-CM

## 2022-11-30 ENCOUNTER — Other Ambulatory Visit: Payer: Self-pay | Admitting: Cardiology

## 2022-11-30 ENCOUNTER — Inpatient Hospital Stay: Payer: Medicare Other

## 2022-11-30 ENCOUNTER — Inpatient Hospital Stay (HOSPITAL_BASED_OUTPATIENT_CLINIC_OR_DEPARTMENT_OTHER): Payer: Medicare Other | Admitting: Hematology & Oncology

## 2022-11-30 ENCOUNTER — Other Ambulatory Visit: Payer: Self-pay

## 2022-11-30 VITALS — BP 98/48 | HR 88 | Temp 98.2°F | Resp 16 | Ht 60.0 in | Wt 133.0 lb

## 2022-11-30 DIAGNOSIS — C7951 Secondary malignant neoplasm of bone: Secondary | ICD-10-CM

## 2022-11-30 DIAGNOSIS — Z5112 Encounter for antineoplastic immunotherapy: Secondary | ICD-10-CM | POA: Diagnosis not present

## 2022-11-30 DIAGNOSIS — C50412 Malignant neoplasm of upper-outer quadrant of left female breast: Secondary | ICD-10-CM | POA: Diagnosis not present

## 2022-11-30 DIAGNOSIS — Z853 Personal history of malignant neoplasm of breast: Secondary | ICD-10-CM | POA: Diagnosis not present

## 2022-11-30 DIAGNOSIS — Z170 Estrogen receptor positive status [ER+]: Secondary | ICD-10-CM

## 2022-11-30 DIAGNOSIS — Z171 Estrogen receptor negative status [ER-]: Secondary | ICD-10-CM | POA: Diagnosis not present

## 2022-11-30 LAB — CBC WITH DIFFERENTIAL (CANCER CENTER ONLY)
Abs Immature Granulocytes: 0.08 10*3/uL — ABNORMAL HIGH (ref 0.00–0.07)
Basophils Absolute: 0 10*3/uL (ref 0.0–0.1)
Basophils Relative: 1 %
Eosinophils Absolute: 0 10*3/uL (ref 0.0–0.5)
Eosinophils Relative: 0 %
HCT: 34.9 % — ABNORMAL LOW (ref 36.0–46.0)
Hemoglobin: 11.5 g/dL — ABNORMAL LOW (ref 12.0–15.0)
Immature Granulocytes: 2 %
Lymphocytes Relative: 16 %
Lymphs Abs: 0.8 10*3/uL (ref 0.7–4.0)
MCH: 33 pg (ref 26.0–34.0)
MCHC: 33 g/dL (ref 30.0–36.0)
MCV: 100.3 fL — ABNORMAL HIGH (ref 80.0–100.0)
Monocytes Absolute: 0.7 10*3/uL (ref 0.1–1.0)
Monocytes Relative: 14 %
Neutro Abs: 3.3 10*3/uL (ref 1.7–7.7)
Neutrophils Relative %: 67 %
Platelet Count: 295 10*3/uL (ref 150–400)
RBC: 3.48 MIL/uL — ABNORMAL LOW (ref 3.87–5.11)
RDW: 18.3 % — ABNORMAL HIGH (ref 11.5–15.5)
WBC Count: 4.9 10*3/uL (ref 4.0–10.5)
nRBC: 0 % (ref 0.0–0.2)

## 2022-11-30 LAB — CMP (CANCER CENTER ONLY)
ALT: 19 U/L (ref 0–44)
AST: 21 U/L (ref 15–41)
Albumin: 3.6 g/dL (ref 3.5–5.0)
Alkaline Phosphatase: 63 U/L (ref 38–126)
Anion gap: 8 (ref 5–15)
BUN: 19 mg/dL (ref 8–23)
CO2: 26 mmol/L (ref 22–32)
Calcium: 8.6 mg/dL — ABNORMAL LOW (ref 8.9–10.3)
Chloride: 106 mmol/L (ref 98–111)
Creatinine: 0.81 mg/dL (ref 0.44–1.00)
GFR, Estimated: >60 mL/min (ref >60)
Glucose, Bld: 117 mg/dL — ABNORMAL HIGH (ref 70–99)
Potassium: 4.2 mmol/L (ref 3.5–5.1)
Sodium: 140 mmol/L (ref 135–145)
Total Bilirubin: 0.6 mg/dL (ref 0.3–1.2)
Total Protein: 6.3 g/dL — ABNORMAL LOW (ref 6.5–8.1)

## 2022-11-30 LAB — LACTATE DEHYDROGENASE: LDH: 283 U/L — ABNORMAL HIGH (ref 98–192)

## 2022-11-30 MED ORDER — HEPARIN SOD (PORK) LOCK FLUSH 100 UNIT/ML IV SOLN
500.0000 [IU] | Freq: Once | INTRAVENOUS | Status: AC
  Administered 2022-11-30: 500 [IU] via INTRAVENOUS

## 2022-11-30 MED ORDER — DEXTROSE 5 % IV SOLN: Freq: Once | INTRAVENOUS | Status: AC

## 2022-11-30 MED ORDER — DIPHENHYDRAMINE HCL 25 MG PO CAPS
25.0000 mg | ORAL_CAPSULE | Freq: Once | ORAL | Status: AC
  Administered 2022-11-30: 25 mg via ORAL
  Filled 2022-11-30: qty 1

## 2022-11-30 MED ORDER — SODIUM CHLORIDE 0.9% FLUSH
10.0000 mL | INTRAVENOUS | Status: DC | PRN
  Administered 2022-11-30: 10 mL via INTRAVENOUS

## 2022-11-30 MED ORDER — SODIUM CHLORIDE 0.9 % IV SOLN
10.0000 mg | Freq: Once | INTRAVENOUS | Status: AC
  Administered 2022-11-30: 10 mg via INTRAVENOUS
  Filled 2022-11-30: qty 10

## 2022-11-30 MED ORDER — SODIUM CHLORIDE 0.9 % IV SOLN
150.0000 mg | Freq: Once | INTRAVENOUS | Status: AC
  Administered 2022-11-30: 150 mg via INTRAVENOUS
  Filled 2022-11-30: qty 150

## 2022-11-30 MED ORDER — ACETAMINOPHEN 325 MG PO TABS
650.0000 mg | ORAL_TABLET | Freq: Once | ORAL | Status: AC
  Administered 2022-11-30: 650 mg via ORAL
  Filled 2022-11-30: qty 2

## 2022-11-30 MED ORDER — PALONOSETRON HCL INJECTION 0.25 MG/5ML
0.2500 mg | Freq: Once | INTRAVENOUS | Status: AC
  Administered 2022-11-30: 0.25 mg via INTRAVENOUS
  Filled 2022-11-30: qty 5

## 2022-11-30 MED ORDER — FAM-TRASTUZUMAB DERUXTECAN-NXKI CHEMO 100 MG IV SOLR
300.0000 mg | Freq: Once | INTRAVENOUS | Status: AC
  Administered 2022-11-30: 300 mg via INTRAVENOUS
  Filled 2022-11-30: qty 15

## 2022-11-30 NOTE — Progress Notes (Signed)
Patient with weight loss. Adjusting dose to match current weight per Dr. Gustavo Lah instructions.

## 2022-11-30 NOTE — Progress Notes (Signed)
Hematology and Oncology Follow Up Visit  Patricia [name] [id number] 20-Jan-1941 82 y.o. 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 is back for follow-up.  She is still managing pretty well.  She really has no specific complaints.  When we last saw her, her CA 27.29 was down to 27.  Apparently, she is having some problems with her balance.  MRI shows why that would be.  I would not think that would be from the Enhertu.  Her husband says that he will get back up with the Orthopedic Surgeon and see if that might be able to get a physical therapy consult.  She has had good appetite.  She has had no change in bowel or bladder habits.  She has had no bleeding or bruising.  There is been no rashes.  She has had no cough or shortness of breath.  Overall, I would say that her performance status is probably ECOG 1.   Wt Readings from Last 3 Encounters:  11/30/22 133 lb (60.3 kg)  11/09/22 139 lb 1.9 oz (63.1 kg)  10/30/22 140 lb (63.5 kg)     Medications:  Current Outpatient Medications:    Cholecalciferol (VITAMIN D3) 50 MCG (2000 UT) capsule, Take 2,000 Units by mouth daily., Disp: , Rfl:    albuterol (PROVENTIL HFA;VENTOLIN HFA) 108 (90 Base) MCG/ACT inhaler, Inhale 2 puffs into the lungs every 6 (six) hours as needed for wheezing or shortness of breath., Disp: , Rfl:    apixaban (ELIQUIS) 5 MG TABS tablet, Take 1 tablet (5 mg total) by mouth 2 (two) times daily., Disp: 180 tablet, Rfl: 3   Apoaequorin (PREVAGEN) 10 MG CAPS, Take 1 capsule by mouth every morning., Disp: 90 capsule, Rfl:  3   atorvastatin (LIPITOR) 40 MG tablet, Take 1 tablet (40 mg total) by mouth daily before supper., Disp: 90 tablet, Rfl: 3   bimatoprost (LUMIGAN) 0.01 % SOLN, Place 1 drop into both eyes 2 (two) times daily., Disp: , Rfl:    calcium-vitamin D (OSCAL WITH D) 500-200 MG-UNIT tablet, Take 1 tablet by mouth daily with breakfast., Disp: 30 tablet, Rfl: 0   dexamethasone (DECADRON) 4 MG tablet, Take 2 tablets (8 mg) by mouth daily for 3 days starting the day after chemotherapy. Take with food., Disp: 30 tablet, Rfl: 1   digoxin (LANOXIN) 0.125 MG tablet, Take 1 tablet (0.125 mg total) by mouth daily., Disp: 90 tablet, Rfl: 3   dorzolamide-timolol (COSOPT) 22.3-6.8 MG/ML ophthalmic solution, Place 1 drop into both eyes at bedtime., Disp: , Rfl:    fluticasone (FLONASE) 50 MCG/ACT nasal spray, 1 spray each nostril following sinus rinses twice daily, Disp: 16 g, Rfl: 2   fluticasone-salmeterol (ADVAIR HFA) 230-21 MCG/ACT inhaler, USE 2 INHALATIONS TWICE A DAY, Disp: 36 g, Rfl: 3   folic acid (FOLVITE) 400 MCG tablet, Take 400 mcg by mouth daily., Disp: , Rfl:    furosemide (LASIX) 20 MG tablet, Take 1 tablet (20 mg total) by mouth 2 (two) times daily. Take additional 20 mg as needed for swelling, Disp: 180 tablet, Rfl: 2   gabapentin (  NEURONTIN) 400 MG capsule, TAKE 1 CAPSULE TWICE A DAY, Disp: 180 capsule, Rfl: 3   L-Methylfolate-B12-B6-B2 (CEREFOLIN) 07-31-48-5 MG TABS, Take 1 tablet by mouth every morning., Disp: 90 tablet, Rfl: 3   levETIRAcetam (KEPPRA XR) 500 MG 24 hr tablet, TAKE 1 TABLET AT BEDTIME, Disp: 90 tablet, Rfl: 3   lidocaine-prilocaine (EMLA) cream, Apply a dime size of cream to port-a-cath 1-2 hours prior to access. Cover with Warren Lacy., Disp: 30 g, Rfl: 3   loratadine (CLARITIN) 10 MG tablet, Take 10 mg by mouth daily., Disp: , Rfl:    memantine (NAMENDA) 10 MG tablet, TAKE 1 TABLET TWICE A DAY, Disp: 180 tablet, Rfl: 3   metoprolol tartrate (LOPRESSOR) 50 MG tablet, Take 1 tablet (50  mg total) by mouth 2 (two) times daily., Disp: 180 tablet, Rfl: 3   Multiple Vitamins-Iron (MULTIVITAMIN/IRON) TABS, Take 1 tablet by mouth daily., Disp: , Rfl:    Netarsudil Dimesylate (RHOPRESSA) 0.02 % SOLN, Place 1 drop into both eyes at bedtime., Disp: , Rfl:    ondansetron (ZOFRAN) 8 MG tablet, Take 1 tablet (8 mg total) by mouth every 8 (eight) hours as needed for nausea or vomiting. Start on the third day after chemotherapy., Disp: 30 tablet, Rfl: 1   OVER THE COUNTER MEDICATION, Take 1 tablet by mouth daily. Zyflamend otc herbal supplement, Disp: , Rfl:    pantoprazole (PROTONIX) 40 MG tablet, TAKE 1 TABLET DAILY, Disp: 90 tablet, Rfl: 3   potassium chloride SA (KLOR-CON M) 20 MEQ tablet, TAKE 1 TABLET TWICE A DAY, Disp: 180 tablet, Rfl: 3   prochlorperazine (COMPAZINE) 10 MG tablet, Take 1 tablet (10 mg total) by mouth every 6 (six) hours as needed for nausea or vomiting., Disp: 30 tablet, Rfl: 1   thyroid (ARMOUR THYROID) 15 MG tablet, Take 1 tablet (15 mg total) by mouth daily. Take with 60mg  for a total dose of 75mg  daily., Disp: 90 tablet, Rfl: 1   thyroid (ARMOUR THYROID) 60 MG tablet, Take 1 tablet (60 mg total) by mouth daily. Takes with 15mg  daily., Disp: 90 tablet, Rfl: 1   traMADol (ULTRAM) 50 MG tablet, Take 1 tablet (50 mg total) by mouth every 6 (six) hours as needed (post op pain)., Disp: 60 tablet, Rfl: 0 No current facility-administered medications for this visit.  Facility-Administered Medications Ordered in Other Visits:    acetaminophen (TYLENOL) tablet 650 mg, 650 mg, Oral, Once, Harlo Jaso, Rose Phi, MD   dexamethasone (DECADRON) 10 mg in sodium chloride 0.9 % 50 mL IVPB, 10 mg, Intravenous, Once, Macil Crady, Rose Phi, MD   dextrose 5 % solution, , Intravenous, Once, Skarlett Sedlacek, Rose Phi, MD   diphenhydrAMINE (BENADRYL) capsule 50 mg, 50 mg, Oral, Once, Taimane Stimmel, Rose Phi, MD   fosaprepitant (EMEND) 150 mg in sodium chloride 0.9 % 145 mL IVPB, 150 mg, Intravenous, Once, Margie Brink,  Rose Phi, MD   palonosetron (ALOXI) injection 0.25 mg, 0.25 mg, Intravenous, Once, Ellwood Steidle, Rose Phi, MD  Allergies:  Allergies  Allergen Reactions   Combigan [Brimonidine Tartrate-Timolol] Itching    Per patient made eyes red, sore, and sensitivity to light   Sulfa Antibiotics Hives and Rash   Sulfamethoxazole-Trimethoprim Hives    Past Medical History, Surgical history, Social history, and Family History were reviewed and updated.  Review of Systems: Review of Systems  Constitutional:  Positive for fatigue. Negative for appetite change.  HENT:  Negative.    Eyes: Negative.   Respiratory:  Positive for shortness of breath. Negative for chest tightness.  Cardiovascular: Negative.   Gastrointestinal: Negative.   Endocrine: Negative.   Genitourinary: Negative.    Musculoskeletal:  Positive for gait problem.  Skin: Negative.   Neurological:  Positive for dizziness and gait problem. Negative for headaches and light-headedness.  Hematological: Negative.   Psychiatric/Behavioral: Negative.      Physical Exam:  height is 5' (1.524 m) and weight is 133 lb (60.3 kg). Her temperature is 98.2 F (36.8 C). Her blood pressure is 98/48 (abnormal) and her pulse is 88. Her respiration is 16 and oxygen saturation is 96%.   Wt Readings from Last 3 Encounters:  11/30/22 133 lb (60.3 kg)  11/09/22 139 lb 1.9 oz (63.1 kg)  10/30/22 140 lb (63.5 kg)    Physical Exam Vitals reviewed.  HENT:     Head: Normocephalic and atraumatic.  Eyes:     Pupils: Pupils are equal, round, and reactive to light.  Cardiovascular:     Rate and Rhythm: Normal rate and regular rhythm.     Heart sounds: Normal heart sounds.  Pulmonary:     Effort: Pulmonary effort is normal.     Breath sounds: Normal breath sounds.  Abdominal:     General: Bowel sounds are normal.     Palpations: Abdomen is soft.  Musculoskeletal:        General: No tenderness or deformity. Normal range of motion.     Cervical back:  Normal range of motion.     Left lower leg: Edema present.     Comments: There is mild edema in the legs bilaterally.  She has good pulses in the distal extremities.  She has decent range of motion of her joints.   Lymphadenopathy:     Cervical: No cervical adenopathy.  Skin:    General: Skin is warm and dry.     Findings: No erythema or rash.  Neurological:     Mental Status: She is alert and oriented to person, place, and time.  Psychiatric:        Behavior: Behavior normal.        Thought Content: Thought content normal.        Judgment: Judgment normal.      Lab Results  Component Value Date   WBC 4.9 11/30/2022   HGB 11.5 (L) 11/30/2022   HCT 34.9 (L) 11/30/2022   MCV 100.3 (H) 11/30/2022   PLT 295 11/30/2022     Chemistry      Component Value Date/Time   NA 140 11/30/2022 1055   NA 141 08/24/2022 1002   NA 141 09/22/2016 1459   K 4.2 11/30/2022 1055   K 3.8 09/22/2016 1459   CL 106 11/30/2022 1055   CO2 26 11/30/2022 1055   CO2 26 09/22/2016 1459   BUN 19 11/30/2022 1055   BUN 18 08/24/2022 1002   BUN 19.8 09/22/2016 1459   CREATININE 0.81 11/30/2022 1055   CREATININE 1.1 09/22/2016 1459      Component Value Date/Time   CALCIUM 8.6 (L) 11/30/2022 1055   CALCIUM 9.2 09/22/2016 1459   ALKPHOS 63 11/30/2022 1055   ALKPHOS 98 09/22/2016 1459   AST 21 11/30/2022 1055   AST 15 09/22/2016 1459   ALT 19 11/30/2022 1055   ALT 21 09/22/2016 1459   BILITOT 0.6 11/30/2022 1055   BILITOT 0.65 09/22/2016 1459      Impression and Plan: Ms. Haghighi is a very nice 82 year old postmenopausal white female.  She has a solitary metastatic focus from breast cancer.  Her initial  breast cancer was probably 7 years ago.  It was estrogen positive. 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  The HER2 was equivocal on initial staining.  She really is doing nicely on the Enhertu.  I am just happy that she has had no complications from the Enhertu.  Her last PET scan was done  back in August.  I do not think we need another 1 probably until December.  We will plan for another follow-up in 3 weeks.    Josph Macho, MD 9/30/202411:58 AM

## 2022-11-30 NOTE — Patient Instructions (Signed)

## 2022-12-01 LAB — CANCER ANTIGEN 27.29: CA 27.29: 25.7 U/mL (ref 0.0–38.6)

## 2022-12-07 ENCOUNTER — Encounter: Payer: Self-pay | Admitting: *Deleted

## 2022-12-07 NOTE — Progress Notes (Signed)
Patient continues on current treatment. No scans needed at this time.   Oncology Nurse Navigator Documentation     12/07/2022    9:00 AM  Oncology Nurse Navigator Flowsheets  Navigator Follow Up Date: 12/21/2022  Navigator Follow Up Reason: Follow-up Appointment;Chemotherapy  Navigator Location [facility]  Navigator Encounter Type Appt/Treatment Plan Review  Patient Visit Type MedOnc  Treatment Phase Active Tx  Barriers/Navigation Needs No Barriers At This Time  Interventions None Required  Acuity Level 1-No Barriers  Support Groups/Services Friends and Family  Time Spent with Patient 15

## 2022-12-10 DIAGNOSIS — M79605 Pain in left leg: Secondary | ICD-10-CM | POA: Diagnosis not present

## 2022-12-10 DIAGNOSIS — Z9889 Other specified postprocedural states: Secondary | ICD-10-CM | POA: Diagnosis not present

## 2022-12-14 ENCOUNTER — Encounter: Payer: Self-pay | Admitting: Hematology & Oncology

## 2022-12-15 DIAGNOSIS — M6281 Muscle weakness (generalized): Secondary | ICD-10-CM | POA: Diagnosis not present

## 2022-12-17 DIAGNOSIS — M6281 Muscle weakness (generalized): Secondary | ICD-10-CM | POA: Diagnosis not present

## 2022-12-19 ENCOUNTER — Encounter: Payer: Self-pay | Admitting: Family Medicine

## 2022-12-21 ENCOUNTER — Inpatient Hospital Stay: Payer: Medicare Other | Attending: Hematology & Oncology

## 2022-12-21 ENCOUNTER — Inpatient Hospital Stay: Payer: Medicare Other

## 2022-12-21 ENCOUNTER — Inpatient Hospital Stay (HOSPITAL_BASED_OUTPATIENT_CLINIC_OR_DEPARTMENT_OTHER): Payer: Medicare Other | Admitting: Medical Oncology

## 2022-12-21 ENCOUNTER — Encounter: Payer: Self-pay | Admitting: Medical Oncology

## 2022-12-21 ENCOUNTER — Encounter: Payer: Self-pay | Admitting: *Deleted

## 2022-12-21 DIAGNOSIS — C7951 Secondary malignant neoplasm of bone: Secondary | ICD-10-CM

## 2022-12-21 DIAGNOSIS — Z170 Estrogen receptor positive status [ER+]: Secondary | ICD-10-CM

## 2022-12-21 DIAGNOSIS — C50412 Malignant neoplasm of upper-outer quadrant of left female breast: Secondary | ICD-10-CM | POA: Insufficient documentation

## 2022-12-21 DIAGNOSIS — Z17421 Hormone receptor negative with human epidermal growth factor receptor 2 negative status: Secondary | ICD-10-CM | POA: Insufficient documentation

## 2022-12-21 DIAGNOSIS — Z5112 Encounter for antineoplastic immunotherapy: Secondary | ICD-10-CM | POA: Diagnosis not present

## 2022-12-21 DIAGNOSIS — Z171 Estrogen receptor negative status [ER-]: Secondary | ICD-10-CM | POA: Insufficient documentation

## 2022-12-21 LAB — CMP (CANCER CENTER ONLY)
ALT: 19 U/L (ref 0–44)
AST: 22 U/L (ref 15–41)
Albumin: 3.8 g/dL (ref 3.5–5.0)
Alkaline Phosphatase: 64 U/L (ref 38–126)
Anion gap: 8 (ref 5–15)
BUN: 21 mg/dL (ref 8–23)
CO2: 26 mmol/L (ref 22–32)
Calcium: 8.9 mg/dL (ref 8.9–10.3)
Chloride: 107 mmol/L (ref 98–111)
Creatinine: 0.85 mg/dL (ref 0.44–1.00)
GFR, Estimated: >60 mL/min (ref >60)
Glucose, Bld: 121 mg/dL — ABNORMAL HIGH (ref 70–99)
Potassium: 4.5 mmol/L (ref 3.5–5.1)
Sodium: 141 mmol/L (ref 135–145)
Total Bilirubin: 0.5 mg/dL (ref 0.3–1.2)
Total Protein: 5.8 g/dL — ABNORMAL LOW (ref 6.5–8.1)

## 2022-12-21 LAB — CBC WITH DIFFERENTIAL (CANCER CENTER ONLY)
Abs Immature Granulocytes: 0.05 10*3/uL (ref 0.00–0.07)
Basophils Absolute: 0 10*3/uL (ref 0.0–0.1)
Basophils Relative: 1 %
Eosinophils Absolute: 0 10*3/uL (ref 0.0–0.5)
Eosinophils Relative: 0 %
HCT: 32 % — ABNORMAL LOW (ref 36.0–46.0)
Hemoglobin: 10.3 g/dL — ABNORMAL LOW (ref 12.0–15.0)
Immature Granulocytes: 1 %
Lymphocytes Relative: 16 %
Lymphs Abs: 0.8 10*3/uL (ref 0.7–4.0)
MCH: 33.2 pg (ref 26.0–34.0)
MCHC: 32.2 g/dL (ref 30.0–36.0)
MCV: 103.2 fL — ABNORMAL HIGH (ref 80.0–100.0)
Monocytes Absolute: 0.8 10*3/uL (ref 0.1–1.0)
Monocytes Relative: 16 %
Neutro Abs: 3.2 10*3/uL (ref 1.7–7.7)
Neutrophils Relative %: 66 %
Platelet Count: 277 10*3/uL (ref 150–400)
RBC: 3.1 MIL/uL — ABNORMAL LOW (ref 3.87–5.11)
RDW: 19.3 % — ABNORMAL HIGH (ref 11.5–15.5)
WBC Count: 4.9 10*3/uL (ref 4.0–10.5)
nRBC: 0.4 % — ABNORMAL HIGH (ref 0.0–0.2)

## 2022-12-21 MED ORDER — ACETAMINOPHEN 325 MG PO TABS
650.0000 mg | ORAL_TABLET | Freq: Once | ORAL | Status: AC
  Administered 2022-12-21: 650 mg via ORAL
  Filled 2022-12-21: qty 2

## 2022-12-21 MED ORDER — FAM-TRASTUZUMAB DERUXTECAN-NXKI CHEMO 100 MG IV SOLR
300.0000 mg | Freq: Once | INTRAVENOUS | Status: AC
  Administered 2022-12-21: 300 mg via INTRAVENOUS
  Filled 2022-12-21: qty 15

## 2022-12-21 MED ORDER — SODIUM CHLORIDE 0.9% FLUSH
10.0000 mL | INTRAVENOUS | Status: DC | PRN
  Administered 2022-12-21: 10 mL

## 2022-12-21 MED ORDER — HEPARIN SOD (PORK) LOCK FLUSH 100 UNIT/ML IV SOLN
500.0000 [IU] | Freq: Once | INTRAVENOUS | Status: AC | PRN
  Administered 2022-12-21: 500 [IU]

## 2022-12-21 MED ORDER — SODIUM CHLORIDE 0.9 % IV SOLN
150.0000 mg | Freq: Once | INTRAVENOUS | Status: AC
  Administered 2022-12-21: 150 mg via INTRAVENOUS
  Filled 2022-12-21: qty 150

## 2022-12-21 MED ORDER — PALONOSETRON HCL INJECTION 0.25 MG/5ML
0.2500 mg | Freq: Once | INTRAVENOUS | Status: AC
  Administered 2022-12-21: 0.25 mg via INTRAVENOUS
  Filled 2022-12-21: qty 5

## 2022-12-21 MED ORDER — DEXTROSE 5 % IV SOLN: Freq: Once | INTRAVENOUS | Status: AC

## 2022-12-21 MED ORDER — DIPHENHYDRAMINE HCL 25 MG PO CAPS
25.0000 mg | ORAL_CAPSULE | Freq: Once | ORAL | Status: AC
  Administered 2022-12-21: 25 mg via ORAL
  Filled 2022-12-21: qty 1

## 2022-12-21 MED ORDER — SODIUM CHLORIDE 0.9 % IV SOLN
10.0000 mg | Freq: Once | INTRAVENOUS | Status: AC
  Administered 2022-12-21: 10 mg via INTRAVENOUS
  Filled 2022-12-21: qty 10

## 2022-12-21 NOTE — Progress Notes (Signed)
Patient continues on chemo. She has not had any navigational needs for sometime, as such will discontinue active navigation at this time. I will be available to the patient as needed in the future.   Oncology Nurse Navigator Documentation     12/21/2022    1:45 PM  Oncology Nurse Navigator Flowsheets  Navigation Complete Date: 12/21/2022  Post Navigation: Continue to Follow Patient? No  Reason Not Navigating Patient: Patient On Maintenance Chemotherapy  Navigator Location [facility]  Navigator Encounter Type Appt/Treatment Plan Review  Patient Visit Type MedOnc  Treatment Phase Active Tx  Barriers/Navigation Needs No Barriers At This Time  Interventions None Required  Acuity Level 1-No Barriers  Support Groups/Services Friends and Family  Time Spent with Patient 15

## 2022-12-21 NOTE — Patient Instructions (Signed)
[facility]  Discharge Instructions: Thank you for choosing [facility] to provide your oncology and hematology care.   If you have a lab appointment with the Cancer Center, please go directly to the Cancer Center and check in at the registration area.  Wear comfortable clothing and clothing appropriate for easy access to any Portacath or PICC line.   We strive to give you quality time with your provider. You may need to reschedule your appointment if you arrive late (15 or more minutes).  Arriving late affects you and other patients whose appointments are after yours.  Also, if you miss three or more appointments without notifying the office, you may be dismissed from the clinic at the provider's discretion.      For prescription refill requests, have your pharmacy contact our office and allow 72 hours for refills to be completed.    Today you received the following chemotherapy and/or immunotherapy agents Enhertu      To help prevent nausea and vomiting after your treatment, we encourage you to take your nausea medication as directed.  BELOW ARE SYMPTOMS THAT SHOULD BE REPORTED IMMEDIATELY: *FEVER GREATER THAN 100.4 F (38 C) OR HIGHER *CHILLS OR SWEATING *NAUSEA AND VOMITING THAT IS NOT CONTROLLED WITH YOUR NAUSEA MEDICATION *UNUSUAL SHORTNESS OF BREATH *UNUSUAL BRUISING OR BLEEDING *URINARY PROBLEMS (pain or burning when urinating, or frequent urination) *BOWEL PROBLEMS (unusual diarrhea, constipation, pain near the anus) TENDERNESS IN MOUTH AND THROAT WITH OR WITHOUT PRESENCE OF ULCERS (sore throat, sores in mouth, or a toothache) UNUSUAL RASH, SWELLING OR PAIN  UNUSUAL VAGINAL DISCHARGE OR ITCHING   Items with * indicate a potential emergency and should be followed up as soon as possible or go to the Emergency Department if any problems should occur.  Please show the CHEMOTHERAPY ALERT CARD or IMMUNOTHERAPY ALERT CARD at check-in  to the Emergency Department and triage nurse. Should you have questions after your visit or need to cancel or reschedule your appointment, please contact [facility]  [phone] and follow the prompts.  Office hours are 8:00 a.m. to 4:30 p.m. Monday - Friday. Please note that voicemails left after 4:00 p.m. may not be returned until the following business day.  We are closed weekends and major holidays. You have access to a nurse at all times for urgent questions. Please call the main number to the clinic [phone] and follow the prompts.  For any non-urgent questions, you may also contact your provider using MyChart. We now offer e-Visits for anyone 18 and older to request care online for non-urgent symptoms. For details visit [email or web address].   Also download the MyChart app! Go to the app store, search "MyChart", open the app, select [facility], and log in with your MyChart username and password.   

## 2022-12-21 NOTE — Progress Notes (Signed)
Hematology and Oncology Follow Up Visit  Patricia [name] [id number] 06-04-1940 82 y.o. 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   Today she reports that She is doing well. They have no concerns today. They do mention that her daughter was diagnosed with breast cancer recently. Initially she was not doing well and had some complications but is doing better now.   When we last saw her, her CA 27.29 was down to 25.7  She has had good appetite.  She has had no change in bowel or bladder habits.  She has had no bleeding or bruising.  There is been no rashes. She has chronic off/on cough which is stable and not worsened. No worsened chronic SOB.   Overall, I would say that her performance status is probably ECOG 1.   Wt Readings from Last 3 Encounters:  12/21/22 135 lb 6.4 oz (61.4 kg)  11/30/22 133 lb (60.3 kg)  11/09/22 139 lb 1.9 oz (63.1 kg)   Medications:  Current Outpatient Medications:    albuterol (PROVENTIL HFA;VENTOLIN HFA) 108 (90 Base) MCG/ACT inhaler, Inhale 2 puffs into the lungs every 6 (six) hours as needed for wheezing or shortness of breath., Disp: , Rfl:    apixaban (ELIQUIS) 5 MG TABS tablet, Take 1 tablet (5 mg total) by mouth 2 (two) times daily., Disp: 180 tablet, Rfl: 3   Apoaequorin (PREVAGEN) 10 MG CAPS, Take 1 capsule by mouth every morning., Disp: 90 capsule, Rfl: 3   atorvastatin (LIPITOR) 40 MG tablet, TAKE 1 TABLET DAILY BEFORE SUPPER, Disp: 90 tablet, Rfl: 1   bimatoprost (LUMIGAN) 0.01  % SOLN, Place 1 drop into both eyes 2 (two) times daily., Disp: , Rfl:    calcium-vitamin D (OSCAL WITH D) 500-200 MG-UNIT tablet, Take 1 tablet by mouth daily with breakfast., Disp: 30 tablet, Rfl: 0   Cholecalciferol (VITAMIN D3) 50 MCG (2000 UT) capsule, Take 2,000 Units by mouth daily., Disp: , Rfl:    dexamethasone (DECADRON) 4 MG tablet, Take 2 tablets (8 mg) by mouth daily for 3 days starting the day after chemotherapy. Take with food., Disp: 30 tablet, Rfl: 1   digoxin (LANOXIN) 0.125 MG tablet, Take 1 tablet (0.125 mg total) by mouth daily., Disp: 90 tablet, Rfl: 3   dorzolamide-timolol (COSOPT) 22.3-6.8 MG/ML ophthalmic solution, Place 1 drop into both eyes at bedtime., Disp: , Rfl:    fluticasone (FLONASE) 50 MCG/ACT nasal spray, 1 spray each nostril following sinus rinses twice daily, Disp: 16 g, Rfl: 2   fluticasone-salmeterol (ADVAIR HFA) 230-21 MCG/ACT inhaler, USE 2 INHALATIONS TWICE A DAY, Disp: 36 g, Rfl: 3   folic acid (FOLVITE) 400 MCG tablet, Take 400 mcg by mouth daily., Disp: , Rfl:    furosemide (LASIX) 20 MG tablet, Take 1 tablet (20 mg total) by mouth 2 (two) times daily. Take additional 20 mg as needed for swelling, Disp: 180 tablet, Rfl: 2   gabapentin (NEURONTIN) 400 MG capsule, TAKE 1 CAPSULE TWICE A DAY, Disp: 180 capsule, Rfl: 3   L-Methylfolate-B12-B6-B2 (CEREFOLIN)  07-31-48-5 MG TABS, Take 1 tablet by mouth every morning., Disp: 90 tablet, Rfl: 3   levETIRAcetam (KEPPRA XR) 500 MG 24 hr tablet, TAKE 1 TABLET AT BEDTIME, Disp: 90 tablet, Rfl: 3   lidocaine-prilocaine (EMLA) cream, Apply a dime size of cream to port-a-cath 1-2 hours prior to access. Cover with Patricia [name]., Disp: 30 g, Rfl: 3   loratadine (CLARITIN) 10 MG tablet, Take 10 mg by mouth daily., Disp: , Rfl:    memantine (NAMENDA) 10 MG tablet, TAKE 1 TABLET TWICE A DAY, Disp: 180 tablet, Rfl: 3   metoprolol tartrate (LOPRESSOR) 50 MG tablet, Take 1 tablet (50 mg total) by mouth 2 (two) times daily., Disp: 180  tablet, Rfl: 3   Multiple Vitamins-Iron (MULTIVITAMIN/IRON) TABS, Take 1 tablet by mouth daily., Disp: , Rfl:    Netarsudil Dimesylate (RHOPRESSA) 0.02 % SOLN, Place 1 drop into both eyes at bedtime., Disp: , Rfl:    OVER THE COUNTER MEDICATION, Take 1 tablet by mouth daily. Zyflamend otc herbal supplement, Disp: , Rfl:    pantoprazole (PROTONIX) 40 MG tablet, TAKE 1 TABLET DAILY, Disp: 90 tablet, Rfl: 3   potassium chloride SA (KLOR-CON M) 20 MEQ tablet, TAKE 1 TABLET TWICE A DAY, Disp: 180 tablet, Rfl: 3   thyroid (ARMOUR THYROID) 15 MG tablet, Take 1 tablet (15 mg total) by mouth daily. Take with 60mg  for a total dose of 75mg  daily., Disp: 90 tablet, Rfl: 1   thyroid (ARMOUR THYROID) 60 MG tablet, Take 1 tablet (60 mg total) by mouth daily. Takes with 15mg  daily., Disp: 90 tablet, Rfl: 1   traMADol (ULTRAM) 50 MG tablet, Take 1 tablet (50 mg total) by mouth every 6 (six) hours as needed (post op pain)., Disp: 60 tablet, Rfl: 0   ondansetron (ZOFRAN) 8 MG tablet, Take 1 tablet (8 mg total) by mouth every 8 (eight) hours as needed for nausea or vomiting. Start on the third day after chemotherapy. (Patient not taking: Reported on 12/21/2022), Disp: 30 tablet, Rfl: 1   prochlorperazine (COMPAZINE) 10 MG tablet, Take 1 tablet (10 mg total) by mouth every 6 (six) hours as needed for nausea or vomiting. (Patient not taking: Reported on 12/21/2022), Disp: 30 tablet, Rfl: 1 No current facility-administered medications for this visit.  Facility-Administered Medications Ordered in Other Visits:    acetaminophen (TYLENOL) tablet 650 mg, 650 mg, Oral, Once, Patricia [name], Patricia Phi, MD   dexamethasone (DECADRON) 10 mg in sodium chloride 0.9 % 50 mL IVPB, 10 mg, Intravenous, Once, Patricia [name], Patricia Phi, MD   dextrose 5 % solution, , Intravenous, Once, Patricia [name], Patricia Phi, MD   diphenhydrAMINE (BENADRYL) capsule 50 mg, 50 mg, Oral, Once, Patricia [name], Patricia Phi, MD   fosaprepitant (EMEND) 150 mg in sodium chloride 0.9 % 145 mL IVPB,  150 mg, Intravenous, Once, Patricia [name], Patricia Phi, MD   palonosetron (ALOXI) injection 0.25 mg, 0.25 mg, Intravenous, Once, Patricia [name], Patricia Phi, MD  Allergies:  Allergies  Allergen Reactions   Combigan [Brimonidine Tartrate-Timolol] Itching    Per patient made eyes red, sore, and sensitivity to light   Sulfa Antibiotics Hives and Rash   Sulfamethoxazole-Trimethoprim Hives    Past Medical History, Surgical history, Social history, and Family History were reviewed and updated.  Review of Systems: Review of Systems  Constitutional:  Positive for fatigue. Negative for appetite change.  HENT:  Negative.    Eyes: Negative.   Respiratory:  Positive for shortness of breath. Negative for chest tightness.   Cardiovascular: Negative.   Gastrointestinal:  Negative.   Endocrine: Negative.   Genitourinary: Negative.    Musculoskeletal:  Positive for gait problem.  Skin: Negative.   Neurological:  Positive for dizziness and gait problem. Negative for headaches and light-headedness.  Hematological: Negative.   Psychiatric/Behavioral: Negative.      Physical Exam:  height is 5' (1.524 m) and weight is 135 lb 6.4 oz (61.4 kg). Her oral temperature is 98.5 F (36.9 C). Her blood pressure is 115/74 and her pulse is 63. Her respiration is 18 and oxygen saturation is 99%.   Wt Readings from Last 3 Encounters:  12/21/22 135 lb 6.4 oz (61.4 kg)  11/30/22 133 lb (60.3 kg)  11/09/22 139 lb 1.9 oz (63.1 kg)    Physical Exam Vitals reviewed.  HENT:     Head: Normocephalic and atraumatic.  Eyes:     Pupils: Pupils are equal, round, and reactive to light.  Cardiovascular:     Rate and Rhythm: Normal rate and regular rhythm.     Heart sounds: Normal heart sounds.  Pulmonary:     Effort: Pulmonary effort is normal.     Breath sounds: Normal breath sounds.  Abdominal:     General: Bowel sounds are normal.     Palpations: Abdomen is soft.  Musculoskeletal:        General: No tenderness or deformity.  Normal range of motion.     Cervical back: Normal range of motion.     Left lower leg: No edema.  Lymphadenopathy:     Cervical: No cervical adenopathy.  Skin:    General: Skin is warm and dry.     Findings: No erythema or rash.  Neurological:     Mental Status: She is alert and oriented to person, place, and time.  Psychiatric:        Behavior: Behavior normal.        Thought Content: Thought content normal.        Judgment: Judgment normal.      Lab Results  Component Value Date   WBC 4.9 11/30/2022   HGB 11.5 (L) 11/30/2022   HCT 34.9 (L) 11/30/2022   MCV 100.3 (H) 11/30/2022   PLT 295 11/30/2022     Chemistry      Component Value Date/Time   NA 140 11/30/2022 1055   NA 141 08/24/2022 1002   NA 141 09/22/2016 1459   K 4.2 11/30/2022 1055   K 3.8 09/22/2016 1459   CL 106 11/30/2022 1055   CO2 26 11/30/2022 1055   CO2 26 09/22/2016 1459   BUN 19 11/30/2022 1055   BUN 18 08/24/2022 1002   BUN 19.8 09/22/2016 1459   CREATININE 0.81 11/30/2022 1055   CREATININE 1.1 09/22/2016 1459      Component Value Date/Time   CALCIUM 8.6 (L) 11/30/2022 1055   CALCIUM 9.2 09/22/2016 1459   ALKPHOS 63 11/30/2022 1055   ALKPHOS 98 09/22/2016 1459   AST 21 11/30/2022 1055   AST 15 09/22/2016 1459   ALT 19 11/30/2022 1055   ALT 21 09/22/2016 1459   BILITOT 0.6 11/30/2022 1055   BILITOT 0.65 09/22/2016 1459     Encounter Diagnoses  Name Primary?   Malignant neoplasm of upper-outer quadrant of left breast in female, estrogen receptor positive (HCC)    Metastasis to bone [facility])    Impression and Plan: Ms. Deighan is a very nice 82 year old postmenopausal white female.  She has a solitary metastatic focus from breast cancer.  Her initial breast cancer was probably  7 years ago.  It was estrogen positive. 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  The HER2 was equivocal on initial staining.  She continues to do well on Enhertu. We again reviewed red flags in terms of her SOB and  cough.   Her last PET scan was done back in August showed below: Next is planned for Dec 2024   "IMPRESSION: 1. Interval improvement in skeletal metastasis with resolution of hypermetabolic lesions in the medial LEFT clavicle and RIGHT pubic symphysis. 2. Stable mild metabolic activity in the RIGHT sacral ala. 3. No new sites of skeletal metastasis. 4. New focus of metabolic activity in the inferior RIGHT lower lobe with mild ground-glass density. Favor small focus of pulmonary infection or inflammation. 5. Moderate to large LEFT pleural effusion slightly increased in volume compared to prior. 6. Postsurgical change in the LEFT breast. No evidence of local recurrence in the LEFT breast. 7. No evidence of visceral metastasis in the abdomen pelvis."  RTC 3 weeks MD, labs (CMP, CBC w/, CA 27.29), Enhertu, Xgeva-[place]    Brand Males Scott, PA-C 10/21/202412:53 PM

## 2022-12-22 DIAGNOSIS — M6281 Muscle weakness (generalized): Secondary | ICD-10-CM | POA: Diagnosis not present

## 2022-12-22 LAB — CANCER ANTIGEN 27.29: CA 27.29: 24.9 U/mL (ref 0.0–38.6)

## 2022-12-24 ENCOUNTER — Other Ambulatory Visit: Payer: Medicare Other

## 2022-12-24 DIAGNOSIS — E032 Hypothyroidism due to medicaments and other exogenous substances: Secondary | ICD-10-CM

## 2022-12-24 DIAGNOSIS — E559 Vitamin D deficiency, unspecified: Secondary | ICD-10-CM

## 2022-12-24 DIAGNOSIS — M6281 Muscle weakness (generalized): Secondary | ICD-10-CM | POA: Diagnosis not present

## 2022-12-24 DIAGNOSIS — I10 Essential (primary) hypertension: Secondary | ICD-10-CM

## 2022-12-25 ENCOUNTER — Encounter: Payer: Self-pay | Admitting: Hematology & Oncology

## 2022-12-25 LAB — COMPREHENSIVE METABOLIC PANEL
ALT: 68 [IU]/L — ABNORMAL HIGH (ref 0–32)
AST: 56 [IU]/L — ABNORMAL HIGH (ref 0–40)
Albumin: 3.8 g/dL (ref 3.7–4.7)
Alkaline Phosphatase: 78 [IU]/L (ref 44–121)
BUN/Creatinine Ratio: 42 — ABNORMAL HIGH (ref 12–28)
BUN: 35 mg/dL — ABNORMAL HIGH (ref 8–27)
Bilirubin Total: 0.3 mg/dL (ref 0.0–1.2)
CO2: 19 mmol/L — ABNORMAL LOW (ref 20–29)
Calcium: 8.5 mg/dL — ABNORMAL LOW (ref 8.7–10.3)
Chloride: 105 mmol/L (ref 96–106)
Creatinine, Ser: 0.83 mg/dL (ref 0.57–1.00)
Globulin, Total: 2.1 g/dL (ref 1.5–4.5)
Glucose: 121 mg/dL — ABNORMAL HIGH (ref 70–99)
Potassium: 5.2 mmol/L (ref 3.5–5.2)
Sodium: 142 mmol/L (ref 134–144)
Total Protein: 5.9 g/dL — ABNORMAL LOW (ref 6.0–8.5)
eGFR: 70 mL/min/{1.73_m2} (ref >59)

## 2022-12-25 LAB — CBC WITH DIFFERENTIAL/PLATELET
Basophils Absolute: 0 10*3/uL (ref 0.0–0.2)
Basos: 0 %
EOS (ABSOLUTE): 0 10*3/uL (ref 0.0–0.4)
Eos: 0 %
Hematocrit: 34.3 % (ref 34.0–46.6)
Hemoglobin: 10.9 g/dL — ABNORMAL LOW (ref 11.1–15.9)
Immature Grans (Abs): 0.1 10*3/uL (ref 0.0–0.1)
Immature Granulocytes: 1 %
Lymphocytes Absolute: 0.3 10*3/uL — ABNORMAL LOW (ref 0.7–3.1)
Lymphs: 3 %
MCH: 33.1 pg — ABNORMAL HIGH (ref 26.6–33.0)
MCHC: 31.8 g/dL (ref 31.5–35.7)
MCV: 104 fL — ABNORMAL HIGH (ref 79–97)
Monocytes Absolute: 0.1 10*3/uL (ref 0.1–0.9)
Monocytes: 1 %
Neutrophils Absolute: 10 10*3/uL — ABNORMAL HIGH (ref 1.4–7.0)
Neutrophils: 95 %
Platelets: 285 10*3/uL (ref 150–450)
RBC: 3.29 x10E6/uL — ABNORMAL LOW (ref 3.77–5.28)
RDW: 17.9 % — ABNORMAL HIGH (ref 11.7–15.4)
WBC: 10.5 10*3/uL (ref 3.4–10.8)

## 2022-12-25 LAB — VITAMIN D 25 HYDROXY (VIT D DEFICIENCY, FRACTURES): Vit D, 25-Hydroxy: 139 ng/mL — ABNORMAL HIGH (ref 30.0–100.0)

## 2022-12-25 LAB — TSH: TSH: 0.784 u[IU]/mL (ref 0.450–4.500)

## 2022-12-25 LAB — T3, FREE: T3, Free: 2 pg/mL (ref 2.0–4.4)

## 2022-12-25 LAB — T4, FREE: Free T4: 0.89 ng/dL (ref 0.82–1.77)

## 2022-12-29 DIAGNOSIS — M6281 Muscle weakness (generalized): Secondary | ICD-10-CM | POA: Diagnosis not present

## 2022-12-31 ENCOUNTER — Ambulatory Visit: Payer: Medicare Other | Admitting: Family Medicine

## 2023-01-03 ENCOUNTER — Encounter: Payer: Self-pay | Admitting: Family Medicine

## 2023-01-05 DIAGNOSIS — M6281 Muscle weakness (generalized): Secondary | ICD-10-CM | POA: Diagnosis not present

## 2023-01-07 DIAGNOSIS — M6281 Muscle weakness (generalized): Secondary | ICD-10-CM | POA: Diagnosis not present

## 2023-01-12 DIAGNOSIS — M6281 Muscle weakness (generalized): Secondary | ICD-10-CM | POA: Diagnosis not present

## 2023-01-14 DIAGNOSIS — M6281 Muscle weakness (generalized): Secondary | ICD-10-CM | POA: Diagnosis not present

## 2023-01-15 ENCOUNTER — Encounter: Payer: Self-pay | Admitting: Physician Assistant

## 2023-01-15 ENCOUNTER — Ambulatory Visit: Payer: Medicare Other | Attending: Physician Assistant | Admitting: Physician Assistant

## 2023-01-15 VITALS — BP 128/74 | HR 109 | Wt 133.4 lb

## 2023-01-15 DIAGNOSIS — G4733 Obstructive sleep apnea (adult) (pediatric): Secondary | ICD-10-CM | POA: Diagnosis not present

## 2023-01-15 DIAGNOSIS — R0602 Shortness of breath: Secondary | ICD-10-CM

## 2023-01-15 DIAGNOSIS — I5032 Chronic diastolic (congestive) heart failure: Secondary | ICD-10-CM

## 2023-01-15 DIAGNOSIS — I4821 Permanent atrial fibrillation: Secondary | ICD-10-CM | POA: Diagnosis not present

## 2023-01-15 DIAGNOSIS — I340 Nonrheumatic mitral (valve) insufficiency: Secondary | ICD-10-CM | POA: Diagnosis not present

## 2023-01-15 MED ORDER — POTASSIUM CHLORIDE CRYS ER 20 MEQ PO TBCR: 20.0000 meq | EXTENDED_RELEASE_TABLET | Freq: Every day | ORAL | 3 refills | Status: DC

## 2023-01-15 NOTE — Progress Notes (Signed)
Cardiology Office Note:  .   Date:  01/15/2023  ID:  Patricia [name], DOB [birth year], MRN [id number] PCP: Patricia Quitter, PA  [facility] Providers Cardiologist:  Armanda Magic, MD Electrophysiologist:  Regan Lemming, MD {  History of Present Illness: Marland Kitchen   Patricia [name] is a 82 y.o. female with a history of chronic atrial fibrillation on Eliquis (amiodarone stopped because of prolonged QT), CVA, chronic diastolic CHF, mild AI, normal NST 2017, lung CA resection 2003, breast CA status post lumpectomy/tamoxifen/XRT, HTN, HLD who presents today for follow-up appointment.  2D echocardiogram in 2018 showed moderate PHTN with PASP 52 mmHg and RHC 04/2016 with borderline pulmonary hypertension.  Repeat 2D echocardiogram 12/2019 showed normal LVEF with mild-moderate MR and normal PASP.  Followed in A-fib clinic and now has permanent atrial fibrillation.  Underwent sleep study in November 2021 was found to have mild OSA with an AHI of 9.6/h with no central apneas underwent CPAP titration but cannot be adequately treated due to ongoing events and underwent BiPAP titration and was ultimately placed on BiPAP.  Most recent 2D echocardiogram done follow-up of mitral regurgitation which showed normal LVEF and systolic function, mild RV enlargement and moderate right atrial enlargement as well as moderate to severe MR and TR which are new.  Underwent TEE on 01/07/2021 showing low normal LV function with EF 50 to 55% with severe left atrial enlargement, severe right atrial enlargement and moderate to severe mitral regurgitation with my exam he is tricuspid valve and moderate TR.  Refer to structural heart clinic and was seen by Dr. Excell Seltzer who felt she had moderate to severe 3 regurgitation due to combination of atrial functional MR with marked left atrial enlargement as well as degenerative mitral valve with severe calcification of the P2 scallop of the posterior leaflet.  It was felt that she was not a  candidate for transcatheter therapies because of severe posterior leaflet calcification.  Ongoing medical therapy was recommended at that time given lack of significant symptoms.  She has been continued on annual echocardiogram reassessments.  Last seen by Dr. Mayford Knife January 2024.  Was doing well with her PAP device at that time.  Tolerating mask and felt the pressure is adequate.  No other symptoms at that time.  She was seen by me 08/24/2022, she has been fighting bone cancer.  She was at the [facility] and felt.  Found to be in atrial fibrillation which is chronic.  Rates were uncontrolled at that time.  She was found to have fluid on her lungs as well.  She was given IV metoprolol as well as Cardizem.  Discharged home on p.o. digoxin and metoprolol. Her biggest issue has been getting through the physical therapy exercises.  Today, she presents with a history of bone cancer and atrial fibrillation (AFib). She reports that her bone cancer treatment seems to be going well, with recent tracers in the green. She had her seventh infusion a couple of weeks ago and now has a port, which has made the process easier.  Regarding her AFib, the patient does not report any noticeable symptoms. She has a history of unsuccessful attempts to correct the AFib, including two unsuccessful shocks. Her heart rate was slightly elevated during the visit, but she is asymptomatic. She is currently on metoprolol and digoxin for AFib management, having switched from Cardizem to digoxin during a previous hospital admission. The patient also takes Lasix for swelling, which she reports is well-controlled.  The  patient is also on potassium supplementation, which has been ongoing for several years. However, recent lab work showed a slightly high potassium level, leading to a discussion about reducing the dosage.  Reports no shortness of breath nor dyspnea on exertion. Reports no chest pain, pressure, or tightness. No  edema, orthopnea, PND. Reports no palpitations.   Discussed the use of AI scribe software for clinical note transcription with the patient, who gave verbal consent to proceed.  ROS: Pertinent ROS is in HPI  Studies Reviewed: .       07/21/22 Echo  IMPRESSIONS     1. Left ventricular ejection fraction, by estimation, is 60 to 65%. The  left ventricle has normal function. The left ventricle has no regional  wall motion abnormalities. There is mild left ventricular hypertrophy.  Left ventricular diastolic parameters  were normal.   2. Right ventricular systolic function is normal. The right ventricular  size is mildly enlarged.   3. Left atrial size was severely dilated.   4. Right atrial size was severely dilated.   5. The mitral valve is normal in structure. Mild to moderate mitral valve  regurgitation. No evidence of mitral stenosis.   6. Tricuspid valve regurgitation is moderate to severe.   7. The aortic valve is tricuspid. Aortic valve regurgitation is mild to  moderate. No aortic stenosis is present. Aortic regurgitation PHT measures  389 msec.   8. The inferior vena cava is normal in size with greater than 50%  respiratory variability, suggesting right atrial pressure of 3 mmHg.   2D echo 12/2019 IMPRESSIONS    1. Left ventricular ejection fraction, by estimation, is 55 to 60%. The  left ventricle has normal function. The left ventricle has no regional  wall motion abnormalities. There is mild left ventricular hypertrophy.  Left ventricular diastolic parameters  are indeterminate.   2. Right ventricular systolic function is mildly reduced. The right  ventricular size is normal. There is mildly elevated pulmonary artery  systolic pressure.   3. Left atrial size was severely dilated.   4. Right atrial size was severely dilated.   5. The mitral valve is normal in structure. Mild to moderate mitral valve  regurgitation. ERO 0.19 cm^2, RV 23 cc. Moderate mitral annular   calcification.   6. Tricuspid valve regurgitation is moderate.   7. The aortic valve is tricuspid. Aortic valve regurgitation is mild.  Mild to moderate aortic valve sclerosis/calcification is present, without  any evidence of aortic stenosis.    2D echo 11/2020 IMPRESSIONS    1. Left ventricular ejection fraction, by estimation, is 60 to 65%. The  left ventricle has normal function. The left ventricle has no regional  wall motion abnormalities. Left ventricular diastolic function could not  be evaluated. There is the  interventricular septum is flattened in systole and diastole, consistent  with right ventricular pressure and volume overload.   2. Right ventricular systolic function is mildly reduced. The right  ventricular size is mildly enlarged. There is moderately elevated  pulmonary artery systolic pressure. The estimated right ventricular  systolic pressure is 46.9 mmHg.   3. Left atrial size was severely dilated.   4. Right atrial size was severely dilated.   5. The mitral valve is degenerative. Moderate to severe mitral valve  regurgitation. No evidence of mitral stenosis.   6. The tricuspid valve is abnormal. Tricuspid valve regurgitation is  moderate to severe.   7. The aortic valve is tricuspid. Aortic valve regurgitation is mild. No  aortic stenosis is present. Aortic regurgitation PHT measures 884 msec.   8. The inferior vena cava is normal in size with greater than 50%  respiratory variability, suggesting right atrial pressure of 3 mmHg.   Comparison(s): Changes from prior study are noted. MR/TR appear to be  moderate to severe on this study. LV function unchanged. AI remains mild.    Risk Assessment/Calculations:    CHA2DS2-VASc Score = 7   This indicates a 11.2% annual risk of stroke. The patient's score is based upon: CHF History: 1 HTN History: 1 Diabetes History: 0 Stroke History: 2 Vascular Disease History: 0 Age Score: 2 Gender Score: 1            Physical Exam:   VS:  BP 128/74   Pulse (!) 109   Wt 133 lb 6.4 oz (60.5 kg)   SpO2 92%   BMI 26.05 kg/m    Wt Readings from Last 3 Encounters:  01/15/23 133 lb 6.4 oz (60.5 kg)  12/21/22 135 lb 6.4 oz (61.4 kg)  11/30/22 133 lb (60.3 kg)    GEN: Well nourished, well developed in no acute distress NECK: No JVD; No carotid bruits CARDIAC: Irregularly irregular, no murmurs, rubs, gallops RESPIRATORY:  Clear to auscultation without rales, wheezing or rhonchi  ABDOMEN: Soft, non-tender, non-distended EXTREMITIES:  No edema; No deformity   ASSESSMENT AND PLAN: .    Chronic diastolic CHF -Euvolemic on exam today -Continue Lasix 20 mg twice a day with an additional 20 mg as needed for swelling -Reduce potassium to 20 mEq daily -Continue current medications which include Eliquis 5 mg twice a day, Lipitor 40 mg daily, Lopressor 50 mg twice a day  HTN -Blood pressure well-controlled today -Continue current medication regimen -Continue low-sodium, heart healthy diet     Bone Cancer Undergoing chemotherapy with recent PET scan showing improvement. Next PET scan scheduled for December. -Continue current chemotherapy regimen.  Permanent Atrial Fibrillation Asymptomatic with irregular rhythm noted on exam. Current medications include Metoprolol 50mg  twice daily and Digoxin 0.125mg  daily. -Check Digoxin level today. -Continue Metoprolol 50mg  twice daily and Digoxin 0.125mg  daily. -Monitor heart rate at home and report if consistently in 90s-110s.  Hyperkalemia (borderline) Recent labs showed high-normal potassium level while on Potassium 20mg  twice daily. -Reduce Potassium to 20mg  once daily. -Check potassium level at next infusion appointment.  Chronic Mitral valve insuffiencey  Stable with no symptoms of leg swelling or difficulty breathing. -Continue current management with Lasix 20mg  twice daily, with additional 20mg  as needed for swelling.  Sleep Apnea No issues reported  with current CPAP device. -Continue current CPAP settings.   Dispo: Follow-up in 4-5 months with myself or Dr. Mayford Knife  Signed, Sharlene Dory, PA-C

## 2023-01-15 NOTE — Patient Instructions (Signed)
Medication Instructions:  Your physician has recommended you make the following change in your medication:  1.) decrease potassium 20 meq to one tablet daily  *If you need a refill on your cardiac medications before your next appointment, please call your pharmacy*   Lab Work: Today: digoxin level If you have labs (blood work) drawn today and your tests are completely normal, you will receive your results only by: MyChart Message (if you have MyChart) OR A paper copy in the mail If you have any lab test that is abnormal or we need to change your treatment, we will call you to review the results.   Testing/Procedures: none   Follow-Up: At [facility], you and your health needs are our priority.  As part of our continuing mission to provide you with exceptional heart care, we have created designated Provider Care Teams.  These Care Teams include your primary Cardiologist (physician) and Advanced Practice Providers (APPs -  Physician Assistants and Nurse Practitioners) who all work together to provide you with the care you need, when you need it.   Your next appointment:   4-5 month(s)  Provider:   Armanda Magic, MD

## 2023-01-16 LAB — DIGOXIN LEVEL: Digoxin, Serum: 0.9 ng/mL (ref 0.5–0.9)

## 2023-01-19 DIAGNOSIS — M6281 Muscle weakness (generalized): Secondary | ICD-10-CM | POA: Diagnosis not present

## 2023-01-19 IMAGING — MG MM DIGITAL SCREENING BILAT W/ TOMO AND CAD
6 of 10 series · 6 of 30 positions shown · non-contrast
Comparison: Previous exam(s).

CLINICAL DATA: Screening.

EXAM:
DIGITAL SCREENING BILATERAL MAMMOGRAM WITH TOMOSYNTHESIS AND CAD
TECHNIQUE: Bilateral screening digital craniocaudal and mediolateral oblique
mammograms were obtained. Bilateral screening digital breast
tomosynthesis was performed. The images were evaluated with
computer-aided detection.

[L CC synth-2D]
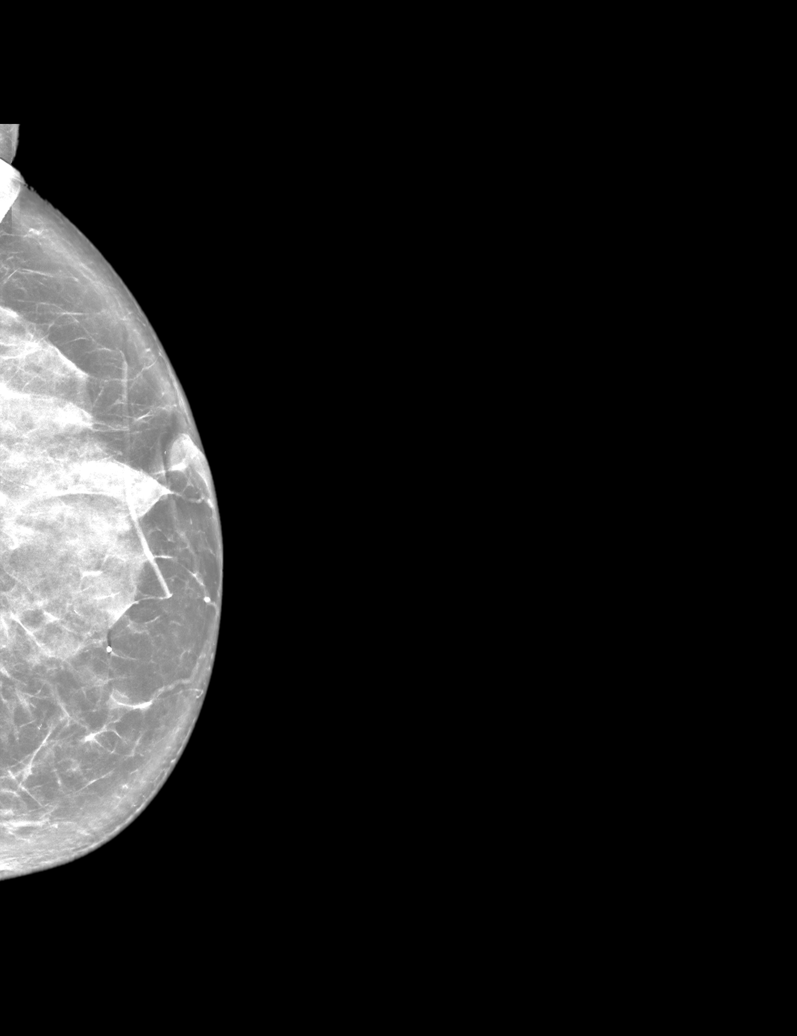

[L MLO synth-2D]
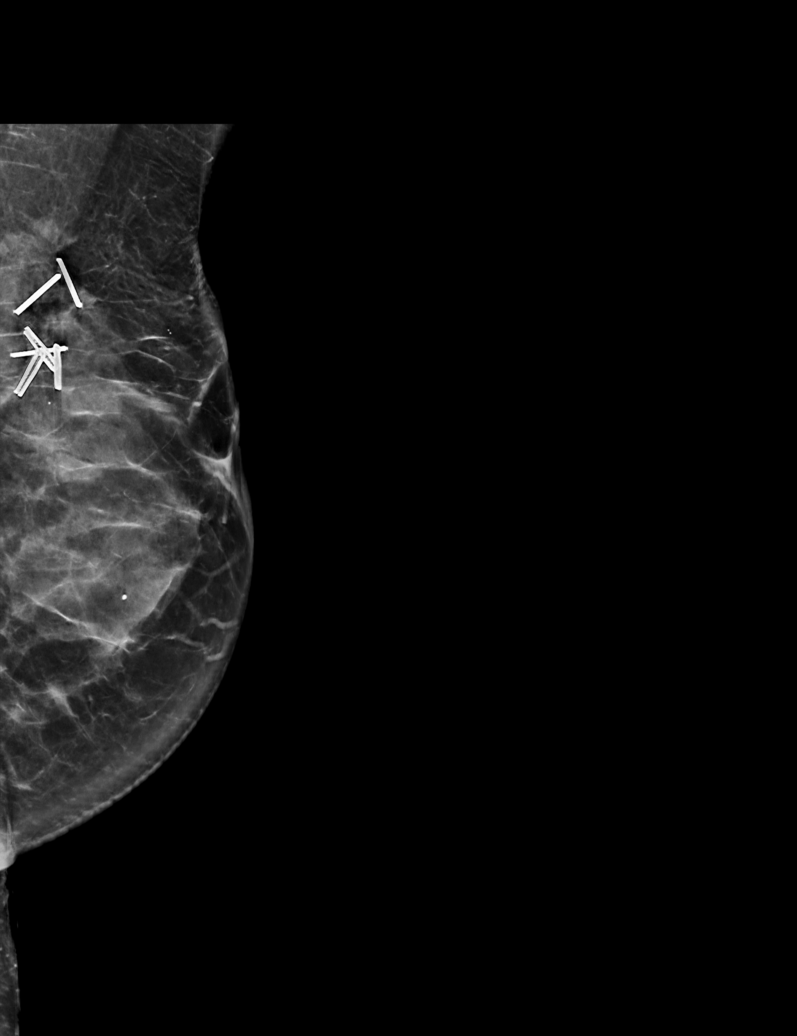

[L XCCL synth-2D]
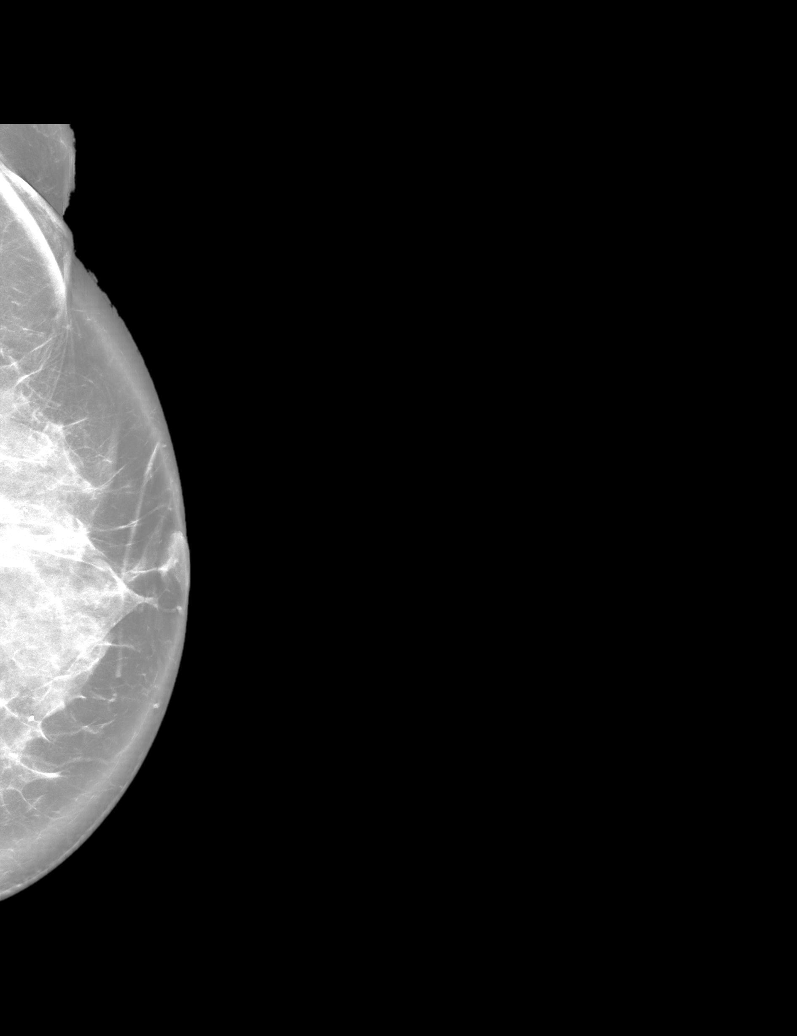

[R CC synth-2D]
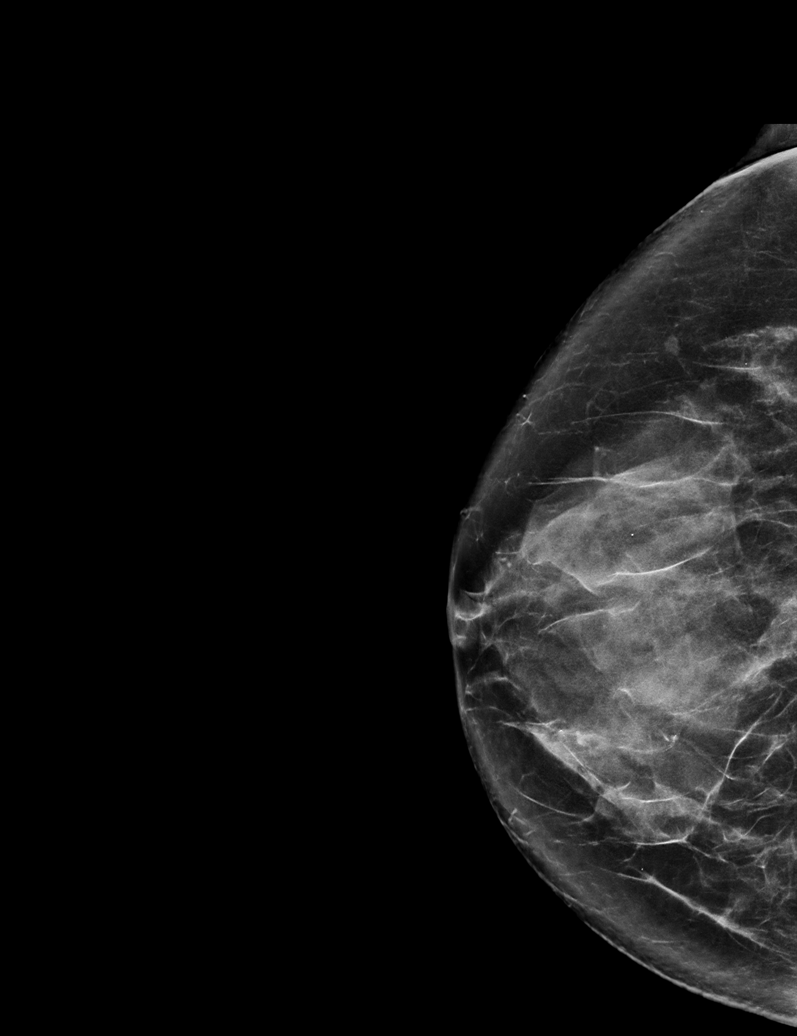

[R MLO synth-2D]
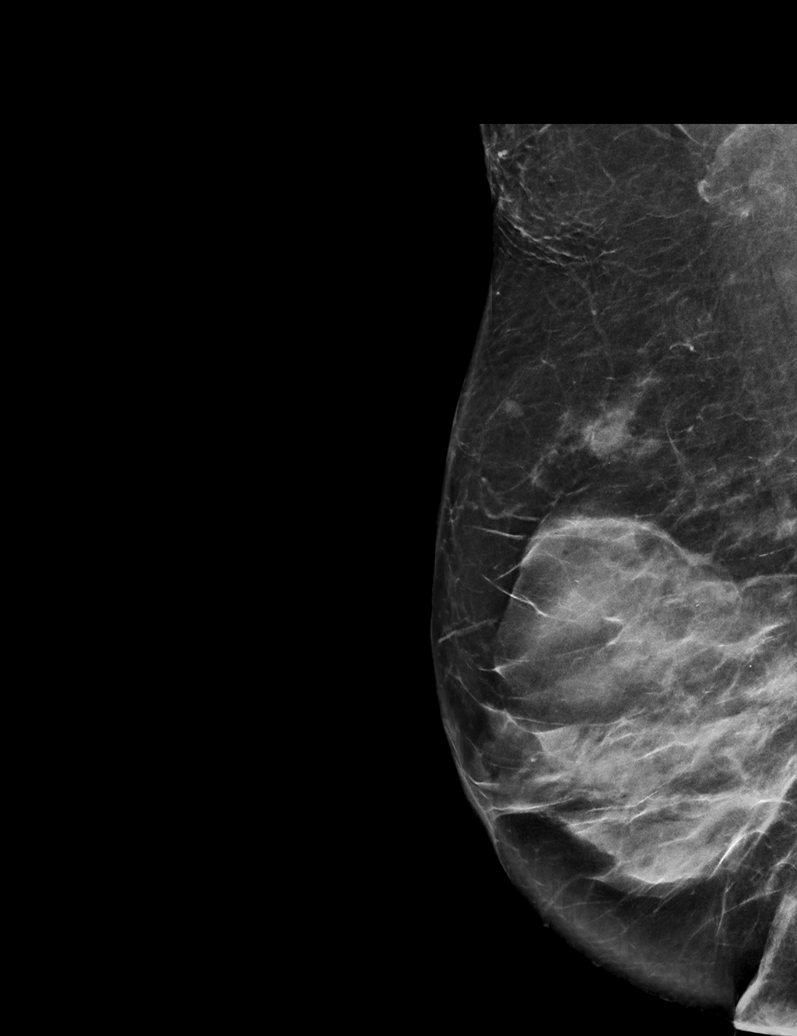

[L XCCL tomo · tomo slice 42/83.0]
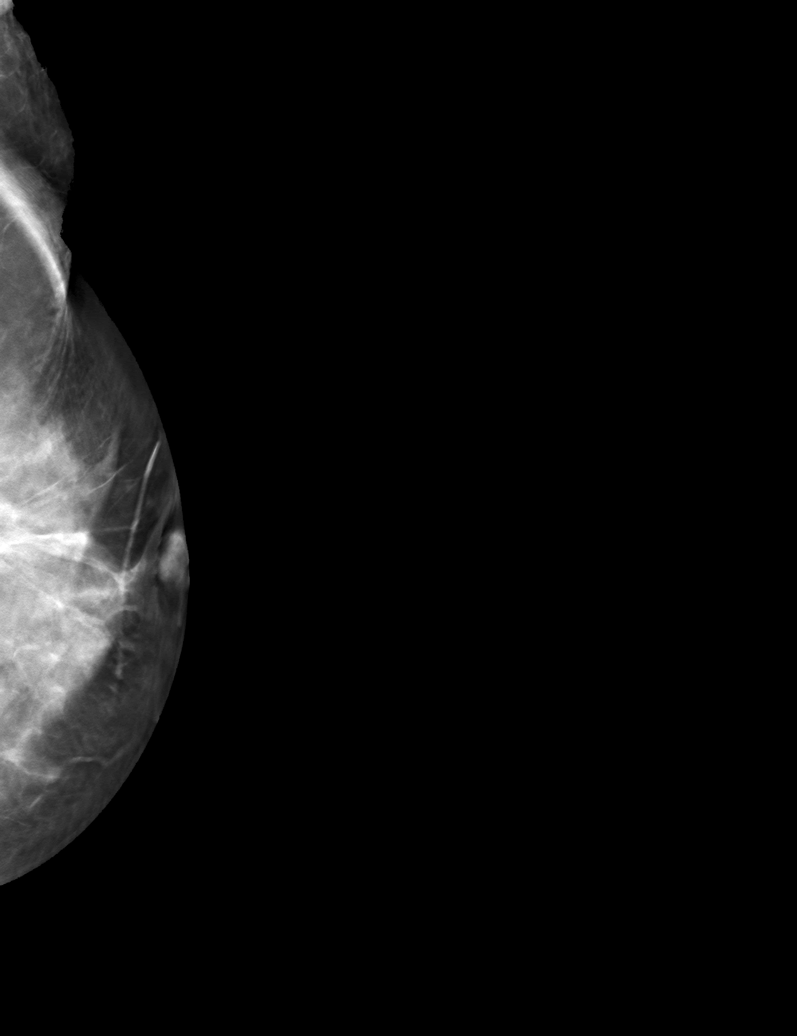

[6 of 30 positions shown; findings below may reference images not displayed]

ACR Breast Density Category d: The breast tissue is extremely dense,
which lowers the sensitivity of mammography.
FINDINGS: In the right breast, a possible asymmetry warrants further
evaluation. In the left breast, no findings suspicious for
malignancy.
IMPRESSION: Further evaluation is suggested for possible asymmetry in the right
breast.

RECOMMENDATION:
Diagnostic mammogram and possibly ultrasound of the right breast.
(Code:CR-G-IIX)

The patient will be contacted regarding the findings, and additional
imaging will be scheduled.

BI-RADS CATEGORY  0: Incomplete. Need additional imaging evaluation
and/or prior mammograms for comparison.

## 2023-01-21 ENCOUNTER — Ambulatory Visit (INDEPENDENT_AMBULATORY_CARE_PROVIDER_SITE_OTHER): Payer: Medicare Other | Admitting: Family Medicine

## 2023-01-21 ENCOUNTER — Encounter: Payer: Self-pay | Admitting: Family Medicine

## 2023-01-21 VITALS — BP 129/79 | HR 91 | Resp 18 | Ht 60.0 in | Wt 133.0 lb

## 2023-01-21 DIAGNOSIS — I10 Essential (primary) hypertension: Secondary | ICD-10-CM

## 2023-01-21 DIAGNOSIS — M6281 Muscle weakness (generalized): Secondary | ICD-10-CM | POA: Diagnosis not present

## 2023-01-21 DIAGNOSIS — E032 Hypothyroidism due to medicaments and other exogenous substances: Secondary | ICD-10-CM | POA: Diagnosis not present

## 2023-01-21 MED ORDER — THYROID 15 MG PO TABS: 15.0000 mg | ORAL_TABLET | Freq: Every day | ORAL | 1 refills | Status: DC

## 2023-01-21 MED ORDER — THYROID 60 MG PO TABS: 60.0000 mg | ORAL_TABLET | Freq: Every day | ORAL | 1 refills | Status: DC

## 2023-01-21 NOTE — Patient Instructions (Signed)
HOLD your vitamin D for the next 4 weeks, then resume your daily vitamin D supplement.

## 2023-01-21 NOTE — Progress Notes (Signed)
Established Patient Office Visit  Subjective   Patient ID: Patricia [name], female    DOB: 1940-06-13  Age: 82 y.o. MRN: [id number]  Chief Complaint  Patient presents with   Hypertension    HPI Patricia [name] is a 82 y.o. female presenting today for follow up of hypertension, hypothyroidism. Hypertension:  Pt denies chest pain, SOB, dizziness, edema, syncope, fatigue or heart palpitations. Taking furosemide and metoprolol, reports excellent compliance with treatment. Denies side effects.  She follows up with cardiology on a regular basis, most recent appointment on 01/15/2023. Hypothyroidism: Taking levothyroxine 75 mcg regularly in the AM away from food and vitamins. Denies fatigue, weight changes, heat/cold intolerance, skin/hair changes, bowel changes, CVS symptoms.  At last appointment, TSH was very high at 20.5, made plans to recheck prior to this appointment.   Outpatient Medications Prior to Visit  Medication Sig   albuterol (PROVENTIL HFA;VENTOLIN HFA) 108 (90 Base) MCG/ACT inhaler Inhale 2 puffs into the lungs every 6 (six) hours as needed for wheezing or shortness of breath.   apixaban (ELIQUIS) 5 MG TABS tablet Take 1 tablet (5 mg total) by mouth 2 (two) times daily.   Apoaequorin (PREVAGEN) 10 MG CAPS Take 1 capsule by mouth every morning.   atorvastatin (LIPITOR) 40 MG tablet TAKE 1 TABLET DAILY BEFORE SUPPER   bimatoprost (LUMIGAN) 0.01 % SOLN Place 1 drop into both eyes 2 (two) times daily.   calcium-vitamin D (OSCAL WITH D) 500-200 MG-UNIT tablet Take 1 tablet by mouth daily with breakfast.   Cholecalciferol (VITAMIN D3) 50 MCG (2000 UT) capsule Take 2,000 Units by mouth daily.   dexamethasone (DECADRON) 4 MG tablet Take 2 tablets (8 mg) by mouth daily for 3 days starting the day after chemotherapy. Take with food.   digoxin (LANOXIN) 0.125 MG tablet Take 1 tablet (0.125 mg total) by mouth daily.   dorzolamide-timolol (COSOPT) 22.3-6.8 MG/ML ophthalmic solution Place 1 drop into  both eyes at bedtime.   fluticasone (FLONASE) 50 MCG/ACT nasal spray 1 spray each nostril following sinus rinses twice daily   fluticasone-salmeterol (ADVAIR HFA) 230-21 MCG/ACT inhaler USE 2 INHALATIONS TWICE A DAY   folic acid (FOLVITE) 400 MCG tablet Take 400 mcg by mouth daily.   furosemide (LASIX) 20 MG tablet Take 1 tablet (20 mg total) by mouth 2 (two) times daily. Take additional 20 mg as needed for swelling   gabapentin (NEURONTIN) 400 MG capsule TAKE 1 CAPSULE TWICE A DAY   L-Methylfolate-B12-B6-B2 (CEREFOLIN) 07-31-48-5 MG TABS Take 1 tablet by mouth every morning.   levETIRAcetam (KEPPRA XR) 500 MG 24 hr tablet TAKE 1 TABLET AT BEDTIME   lidocaine-prilocaine (EMLA) cream Apply a dime size of cream to port-a-cath 1-2 hours prior to access. Cover with Warren Lacy.   loratadine (CLARITIN) 10 MG tablet Take 10 mg by mouth daily.   memantine (NAMENDA) 10 MG tablet TAKE 1 TABLET TWICE A DAY   metoprolol tartrate (LOPRESSOR) 50 MG tablet Take 1 tablet (50 mg total) by mouth 2 (two) times daily.   Multiple Vitamins-Iron (MULTIVITAMIN/IRON) TABS Take 1 tablet by mouth daily.   Netarsudil Dimesylate (RHOPRESSA) 0.02 % SOLN Place 1 drop into both eyes at bedtime.   ondansetron (ZOFRAN) 8 MG tablet Take 1 tablet (8 mg total) by mouth every 8 (eight) hours as needed for nausea or vomiting. Start on the third day after chemotherapy.   OVER THE COUNTER MEDICATION Take 1 tablet by mouth daily. Zyflamend otc herbal supplement   pantoprazole (PROTONIX) 40 MG tablet  TAKE 1 TABLET DAILY   potassium chloride SA (KLOR-CON M) 20 MEQ tablet Take 1 tablet (20 mEq total) by mouth daily.   prochlorperazine (COMPAZINE) 10 MG tablet Take 1 tablet (10 mg total) by mouth every 6 (six) hours as needed for nausea or vomiting.   traMADol (ULTRAM) 50 MG tablet Take 1 tablet (50 mg total) by mouth every 6 (six) hours as needed (post op pain).   [DISCONTINUED] thyroid (ARMOUR THYROID) 15 MG tablet Take 1 tablet (15 mg  total) by mouth daily. Take with 60mg  for a total dose of 75mg  daily.   [DISCONTINUED] thyroid (ARMOUR THYROID) 60 MG tablet Take 1 tablet (60 mg total) by mouth daily. Takes with 15mg  daily.   Facility-Administered Medications Prior to Visit  Medication Dose Route Frequency Provider   acetaminophen (TYLENOL) tablet 650 mg  650 mg Oral Once Josph Macho, MD   dexamethasone (DECADRON) 10 mg in sodium chloride 0.9 % 50 mL IVPB  10 mg Intravenous Once Ennever, Rose Phi, MD   dextrose 5 % solution   Intravenous Once Ennever, Rose Phi, MD   diphenhydrAMINE (BENADRYL) capsule 50 mg  50 mg Oral Once Josph Macho, MD   fosaprepitant (EMEND) 150 mg in sodium chloride 0.9 % 145 mL IVPB  150 mg Intravenous Once Josph Macho, MD   palonosetron (ALOXI) injection 0.25 mg  0.25 mg Intravenous Once Josph Macho, MD    ROS Negative unless otherwise noted in HPI   Objective:     BP 129/79 (BP Location: Right Arm, Patient Position: Sitting, Cuff Size: Normal)   Pulse 91   Resp 18   Ht 5' (1.524 m)   Wt 133 lb (60.3 kg)   SpO2 93%   BMI 25.97 kg/m   Physical Exam Constitutional:      General: She is not in acute distress.    Appearance: Normal appearance.  HENT:     Head: Normocephalic and atraumatic.  Cardiovascular:     Rate and Rhythm: Normal rate and regular rhythm.     Heart sounds: No murmur heard.    No friction rub. No gallop.  Pulmonary:     Effort: Pulmonary effort is normal. No respiratory distress.     Breath sounds: No wheezing, rhonchi or rales.  Skin:    General: Skin is warm and dry.  Neurological:     Mental Status: She is alert and oriented to person, place, and time.      Assessment & Plan:  Essential hypertension Assessment & Plan: BP goal <140/90.  Stable, at goal.  Also followed by cardiology.  Continue metoprolol tartrate 50 mg twice daily, furosemide 20 mg twice daily, digoxin 0.125 mg daily.  Will continue to monitor and coordinate care with  cardiology.   Hypothyroidism due to medication Assessment & Plan: TSH, T3, and T4 have normalized since previous check.  Continue levothyroxine 75 mcg daily.  Will continue to monitor.  Orders: -     Thyroid; Take 1 tablet (15 mg total) by mouth daily. Take with 60mg  for a total dose of 75mg  daily.  Dispense: 90 tablet; Refill: 1 -     Thyroid; Take 1 tablet (60 mg total) by mouth daily. Takes with 15mg  daily.  Dispense: 90 tablet; Refill: 1  Reviewed CMP, CBC, thyroid panel, vitamin D.  Within normal limits with the exception of vitamin D significantly elevated 139, BUN elevated 35, BUN/creatinine ratio elevated 42, low calcium, protein, and CO2.  AST and ALT elevated.  Hemoglobin low at 10.9 but has improved from 4 days prior.  Recommend to hold vitamin D for 4 weeks, maintain adequate hydration.  Lab results visible for cardiology and oncology as well.  Return in about 6 months (around 07/21/2023) for follow-up for HTN, HLD, preDM, hypothyroidism, fasting blood work 1 week before.    [name], PA

## 2023-01-21 NOTE — Assessment & Plan Note (Signed)
BP goal <140/90.  Stable, at goal.  Also followed by cardiology.  Continue metoprolol tartrate 50 mg twice daily, furosemide 20 mg twice daily, digoxin 0.125 mg daily.  Will continue to monitor and coordinate care with cardiology.

## 2023-01-21 NOTE — Assessment & Plan Note (Signed)
TSH, T3, and T4 have normalized since previous check.  Continue levothyroxine 75 mcg daily.  Will continue to monitor.

## 2023-02-01 ENCOUNTER — Telehealth: Payer: Self-pay

## 2023-02-01 NOTE — Telephone Encounter (Signed)
Received phone call from patient husband stating she never was scheduled for a return to clinic after their appt in October. Reviewed pt chart with Clent Jacks, PA and pt to be seen this week for labs/MD/Infusion as patient is overdue for chemotherapy.  This RN called husband with updated appointments and offered apologies for delay in appointment scheduling. Pt husband was very kind and understanding.

## 2023-02-02 DIAGNOSIS — M6281 Muscle weakness (generalized): Secondary | ICD-10-CM | POA: Diagnosis not present

## 2023-02-04 ENCOUNTER — Inpatient Hospital Stay: Payer: Medicare Other

## 2023-02-04 ENCOUNTER — Inpatient Hospital Stay: Payer: Medicare Other | Attending: Hematology & Oncology

## 2023-02-04 ENCOUNTER — Inpatient Hospital Stay (HOSPITAL_BASED_OUTPATIENT_CLINIC_OR_DEPARTMENT_OTHER): Payer: Medicare Other | Admitting: Medical Oncology

## 2023-02-04 ENCOUNTER — Encounter: Payer: Self-pay | Admitting: Medical Oncology

## 2023-02-04 VITALS — BP 130/77 | HR 100 | Resp 22

## 2023-02-04 VITALS — BP 125/82 | HR 89 | Temp 98.1°F | Resp 19 | Ht 60.0 in | Wt 133.8 lb

## 2023-02-04 DIAGNOSIS — Z170 Estrogen receptor positive status [ER+]: Secondary | ICD-10-CM

## 2023-02-04 DIAGNOSIS — Z5112 Encounter for antineoplastic immunotherapy: Secondary | ICD-10-CM | POA: Insufficient documentation

## 2023-02-04 DIAGNOSIS — C7951 Secondary malignant neoplasm of bone: Secondary | ICD-10-CM | POA: Insufficient documentation

## 2023-02-04 DIAGNOSIS — Z17421 Hormone receptor negative with human epidermal growth factor receptor 2 negative status: Secondary | ICD-10-CM | POA: Diagnosis not present

## 2023-02-04 DIAGNOSIS — M8580 Other specified disorders of bone density and structure, unspecified site: Secondary | ICD-10-CM

## 2023-02-04 DIAGNOSIS — Z171 Estrogen receptor negative status [ER-]: Secondary | ICD-10-CM | POA: Insufficient documentation

## 2023-02-04 DIAGNOSIS — C50412 Malignant neoplasm of upper-outer quadrant of left female breast: Secondary | ICD-10-CM

## 2023-02-04 DIAGNOSIS — M810 Age-related osteoporosis without current pathological fracture: Secondary | ICD-10-CM

## 2023-02-04 LAB — CBC WITH DIFFERENTIAL (CANCER CENTER ONLY)
Abs Immature Granulocytes: 0.03 10*3/uL (ref 0.00–0.07)
Basophils Absolute: 0 10*3/uL (ref 0.0–0.1)
Basophils Relative: 0 %
Eosinophils Absolute: 0 10*3/uL (ref 0.0–0.5)
Eosinophils Relative: 0 %
HCT: 37.9 % (ref 36.0–46.0)
Hemoglobin: 12.4 g/dL (ref 12.0–15.0)
Immature Granulocytes: 0 %
Lymphocytes Relative: 14 %
Lymphs Abs: 1.3 10*3/uL (ref 0.7–4.0)
MCH: 33.2 pg (ref 26.0–34.0)
MCHC: 32.7 g/dL (ref 30.0–36.0)
MCV: 101.6 fL — ABNORMAL HIGH (ref 80.0–100.0)
Monocytes Absolute: 0.8 10*3/uL (ref 0.1–1.0)
Monocytes Relative: 9 %
Neutro Abs: 6.9 10*3/uL (ref 1.7–7.7)
Neutrophils Relative %: 77 %
Platelet Count: 251 10*3/uL (ref 150–400)
RBC: 3.73 MIL/uL — ABNORMAL LOW (ref 3.87–5.11)
RDW: 14.6 % (ref 11.5–15.5)
WBC Count: 9.1 10*3/uL (ref 4.0–10.5)
nRBC: 0 % (ref 0.0–0.2)

## 2023-02-04 LAB — CMP (CANCER CENTER ONLY)
ALT: 17 U/L (ref 0–44)
AST: 22 U/L (ref 15–41)
Albumin: 3.7 g/dL (ref 3.5–5.0)
Alkaline Phosphatase: 61 U/L (ref 38–126)
Anion gap: 9 (ref 5–15)
BUN: 25 mg/dL — ABNORMAL HIGH (ref 8–23)
CO2: 28 mmol/L (ref 22–32)
Calcium: 9.3 mg/dL (ref 8.9–10.3)
Chloride: 104 mmol/L (ref 98–111)
Creatinine: 0.86 mg/dL (ref 0.44–1.00)
GFR, Estimated: >60 mL/min (ref >60)
Glucose, Bld: 106 mg/dL — ABNORMAL HIGH (ref 70–99)
Potassium: 4 mmol/L (ref 3.5–5.1)
Sodium: 141 mmol/L (ref 135–145)
Total Bilirubin: 0.6 mg/dL (ref <1.2)
Total Protein: 6.7 g/dL (ref 6.5–8.1)

## 2023-02-04 MED ORDER — SODIUM CHLORIDE 0.9 % IV SOLN
150.0000 mg | Freq: Once | INTRAVENOUS | Status: AC
  Administered 2023-02-04: 150 mg via INTRAVENOUS
  Filled 2023-02-04: qty 150

## 2023-02-04 MED ORDER — HEPARIN SOD (PORK) LOCK FLUSH 100 UNIT/ML IV SOLN
500.0000 [IU] | Freq: Once | INTRAVENOUS | Status: AC | PRN
  Administered 2023-02-04: 500 [IU]

## 2023-02-04 MED ORDER — SODIUM CHLORIDE 0.9% FLUSH
10.0000 mL | INTRAVENOUS | Status: DC | PRN
Start: 2023-02-04 — End: 2023-02-04
  Administered 2023-02-04: 10 mL

## 2023-02-04 MED ORDER — DIPHENHYDRAMINE HCL 25 MG PO CAPS
25.0000 mg | ORAL_CAPSULE | Freq: Once | ORAL | Status: AC
  Administered 2023-02-04: 25 mg via ORAL
  Filled 2023-02-04: qty 1

## 2023-02-04 MED ORDER — FAM-TRASTUZUMAB DERUXTECAN-NXKI CHEMO 100 MG IV SOLR
300.0000 mg | Freq: Once | INTRAVENOUS | Status: AC
  Administered 2023-02-04: 300 mg via INTRAVENOUS
  Filled 2023-02-04: qty 15

## 2023-02-04 MED ORDER — ACETAMINOPHEN 325 MG PO TABS
650.0000 mg | ORAL_TABLET | Freq: Once | ORAL | Status: AC
  Administered 2023-02-04: 650 mg via ORAL
  Filled 2023-02-04: qty 2

## 2023-02-04 MED ORDER — PALONOSETRON HCL INJECTION 0.25 MG/5ML
0.2500 mg | Freq: Once | INTRAVENOUS | Status: AC
  Administered 2023-02-04: 0.25 mg via INTRAVENOUS
  Filled 2023-02-04: qty 5

## 2023-02-04 MED ORDER — DENOSUMAB 120 MG/1.7ML ~~LOC~~ SOLN
120.0000 mg | Freq: Once | SUBCUTANEOUS | Status: AC
  Administered 2023-02-04: 120 mg via SUBCUTANEOUS
  Filled 2023-02-04: qty 1.7

## 2023-02-04 MED ORDER — DEXAMETHASONE SODIUM PHOSPHATE 10 MG/ML IJ SOLN
10.0000 mg | Freq: Once | INTRAMUSCULAR | Status: AC
  Administered 2023-02-04: 10 mg via INTRAVENOUS
  Filled 2023-02-04: qty 1

## 2023-02-04 MED ORDER — DEXTROSE 5 % IV SOLN: Freq: Once | INTRAVENOUS | Status: AC

## 2023-02-04 NOTE — Progress Notes (Signed)
Hematology and Oncology Follow Up Visit  Patricia [name] [id number] 1940/04/09 82 y.o. 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   Today she reports that She is doing well. They have no concerns today. They do mention that her daughter was diagnosed with breast cancer recently. Initially she was not doing well and had some complications but is doing better now.   When we last saw her, her CA 27.29 was down to 24.9  She has had good appetite.  She has had no change in bowel or bladder habits.  She has had no bleeding or bruising.  There is been no rashes. She has chronic off/on cough which is stable and not worsened. No worsened chronic SOB.   No upcoming dental work. No dental pain or concerns. No jaw pain.  She will see her eye specialist in about 2 weeks.   Overall, I would say that her performance status is probably ECOG 1.  Wt Readings from Last 3 Encounters:  02/04/23 133 lb 12.8 oz (60.7 kg)  01/21/23 133 lb (60.3 kg)  01/15/23 133 lb 6.4 oz (60.5 kg)   Medications:  Current Outpatient Medications:    albuterol (PROVENTIL HFA;VENTOLIN HFA) 108 (90 Base) MCG/ACT inhaler, Inhale 2 puffs into the lungs every 6 (six) hours as needed for wheezing or shortness of breath., Disp: , Rfl:    apixaban (ELIQUIS) 5 MG TABS tablet, Take 1 tablet (5 mg total) by mouth 2 (two) times daily., Disp: 180 tablet, Rfl: 3   Apoaequorin (PREVAGEN) 10 MG CAPS, Take 1 capsule by mouth every morning., Disp: 90 capsule, Rfl: 3   atorvastatin (LIPITOR) 40 MG tablet, TAKE 1 TABLET DAILY  BEFORE SUPPER, Disp: 90 tablet, Rfl: 1   bimatoprost (LUMIGAN) 0.01 % SOLN, Place 1 drop into both eyes 2 (two) times daily., Disp: , Rfl:    calcium-vitamin D (OSCAL WITH D) 500-200 MG-UNIT tablet, Take 1 tablet by mouth daily with breakfast., Disp: 30 tablet, Rfl: 0   dexamethasone (DECADRON) 4 MG tablet, Take 2 tablets (8 mg) by mouth daily for 3 days starting the day after chemotherapy. Take with food., Disp: 30 tablet, Rfl: 1   digoxin (LANOXIN) 0.125 MG tablet, Take 1 tablet (0.125 mg total) by mouth daily., Disp: 90 tablet, Rfl: 3   dorzolamide-timolol (COSOPT) 22.3-6.8 MG/ML ophthalmic solution, Place 1 drop into both eyes at bedtime., Disp: , Rfl:    fluticasone (FLONASE) 50 MCG/ACT nasal spray, 1 spray each nostril following sinus rinses twice daily, Disp: 16 g, Rfl: 2   fluticasone-salmeterol (ADVAIR HFA) 230-21 MCG/ACT inhaler, USE 2 INHALATIONS TWICE A DAY, Disp: 36 g, Rfl: 3   folic acid (FOLVITE) 400 MCG tablet, Take 400 mcg by mouth daily., Disp: , Rfl:    furosemide (LASIX) 20 MG tablet, Take 1 tablet (20 mg total) by mouth 2 (two) times daily. Take additional 20 mg as needed for swelling, Disp: 180 tablet, Rfl: 2   gabapentin (NEURONTIN) 400 MG capsule, TAKE 1 CAPSULE TWICE A DAY, Disp: 180 capsule, Rfl: 3   L-Methylfolate-B12-B6-B2 (CEREFOLIN) 07-31-48-5 MG TABS, Take 1 tablet  by mouth every morning., Disp: 90 tablet, Rfl: 3   levETIRAcetam (KEPPRA XR) 500 MG 24 hr tablet, TAKE 1 TABLET AT BEDTIME, Disp: 90 tablet, Rfl: 3   lidocaine-prilocaine (EMLA) cream, Apply a dime size of cream to port-a-cath 1-2 hours prior to access. Cover with Warren Lacy., Disp: 30 g, Rfl: 3   loratadine (CLARITIN) 10 MG tablet, Take 10 mg by mouth daily., Disp: , Rfl:    memantine (NAMENDA) 10 MG tablet, TAKE 1 TABLET TWICE A DAY, Disp: 180 tablet, Rfl: 3   metoprolol tartrate (LOPRESSOR) 50 MG tablet, Take 1 tablet (50 mg total) by mouth 2 (two) times daily., Disp: 180 tablet, Rfl: 3   Multiple  Vitamins-Iron (MULTIVITAMIN/IRON) TABS, Take 1 tablet by mouth daily., Disp: , Rfl:    Netarsudil Dimesylate (RHOPRESSA) 0.02 % SOLN, Place 1 drop into both eyes at bedtime., Disp: , Rfl:    ondansetron (ZOFRAN) 8 MG tablet, Take 1 tablet (8 mg total) by mouth every 8 (eight) hours as needed for nausea or vomiting. Start on the third day after chemotherapy., Disp: 30 tablet, Rfl: 1   OVER THE COUNTER MEDICATION, Take 1 tablet by mouth daily. Zyflamend otc herbal supplement, Disp: , Rfl:    pantoprazole (PROTONIX) 40 MG tablet, TAKE 1 TABLET DAILY, Disp: 90 tablet, Rfl: 3   potassium chloride SA (KLOR-CON M) 20 MEQ tablet, Take 1 tablet (20 mEq total) by mouth daily., Disp: 90 tablet, Rfl: 3   prochlorperazine (COMPAZINE) 10 MG tablet, Take 1 tablet (10 mg total) by mouth every 6 (six) hours as needed for nausea or vomiting., Disp: 30 tablet, Rfl: 1   thyroid (ARMOUR THYROID) 15 MG tablet, Take 1 tablet (15 mg total) by mouth daily. Take with 60mg  for a total dose of 75mg  daily., Disp: 90 tablet, Rfl: 1   thyroid (ARMOUR THYROID) 60 MG tablet, Take 1 tablet (60 mg total) by mouth daily. Takes with 15mg  daily., Disp: 90 tablet, Rfl: 1   traMADol (ULTRAM) 50 MG tablet, Take 1 tablet (50 mg total) by mouth every 6 (six) hours as needed (post op pain)., Disp: 60 tablet, Rfl: 0   Cholecalciferol (VITAMIN D3) 50 MCG (2000 UT) capsule, Take 2,000 Units by mouth daily. (Patient not taking: Reported on 02/04/2023), Disp: , Rfl:  No current facility-administered medications for this visit.  Facility-Administered Medications Ordered in Other Visits:    acetaminophen (TYLENOL) tablet 650 mg, 650 mg, Oral, Once, Ennever, Rose Phi, MD   dexamethasone (DECADRON) 10 mg in sodium chloride 0.9 % 50 mL IVPB, 10 mg, Intravenous, Once, Ennever, Rose Phi, MD   dextrose 5 % solution, , Intravenous, Once, Ennever, Rose Phi, MD   diphenhydrAMINE (BENADRYL) capsule 50 mg, 50 mg, Oral, Once, Ennever, Rose Phi, MD   fosaprepitant  (EMEND) 150 mg in sodium chloride 0.9 % 145 mL IVPB, 150 mg, Intravenous, Once, Ennever, Rose Phi, MD   palonosetron (ALOXI) injection 0.25 mg, 0.25 mg, Intravenous, Once, Ennever, Rose Phi, MD  Allergies:  Allergies  Allergen Reactions   Combigan [Brimonidine Tartrate-Timolol] Itching    Per patient made eyes red, sore, and sensitivity to light   Sulfa Antibiotics Hives and Rash   Sulfamethoxazole-Trimethoprim Hives    Past Medical History, Surgical history, Social history, and Family History were reviewed and updated.  Review of Systems: Review of Systems  Constitutional:  Positive for fatigue. Negative for appetite change.  HENT:  Negative.    Eyes: Negative.   Respiratory:  Positive for shortness of breath (  chronic and stable). Negative for chest tightness.   Cardiovascular: Negative.   Gastrointestinal: Negative.   Endocrine: Negative.   Genitourinary: Negative.    Musculoskeletal:  Positive for gait problem.  Skin: Negative.   Neurological:  Positive for dizziness and gait problem. Negative for headaches and light-headedness.  Hematological: Negative.   Psychiatric/Behavioral: Negative.      Physical Exam:  height is 5' (1.524 m) and weight is 133 lb 12.8 oz (60.7 kg). Her oral temperature is 98.1 F (36.7 C). Her blood pressure is 125/82 and her pulse is 89. Her respiration is 19 and oxygen saturation is 96%.   Wt Readings from Last 3 Encounters:  02/04/23 133 lb 12.8 oz (60.7 kg)  01/21/23 133 lb (60.3 kg)  01/15/23 133 lb 6.4 oz (60.5 kg)    Physical Exam Vitals reviewed.  HENT:     Head: Normocephalic and atraumatic.  Eyes:     Pupils: Pupils are equal, round, and reactive to light.  Cardiovascular:     Rate and Rhythm: Normal rate and regular rhythm.     Heart sounds: Normal heart sounds.  Pulmonary:     Effort: Pulmonary effort is normal.     Breath sounds: Normal breath sounds.  Abdominal:     General: Bowel sounds are normal.     Palpations: Abdomen  is soft.  Musculoskeletal:        General: No tenderness or deformity. Normal range of motion.     Cervical back: Normal range of motion.     Left lower leg: No edema.  Lymphadenopathy:     Cervical: No cervical adenopathy.  Skin:    General: Skin is warm and dry.     Findings: No erythema or rash.  Neurological:     Mental Status: She is alert and oriented to person, place, and time.  Psychiatric:        Behavior: Behavior normal.        Thought Content: Thought content normal.        Judgment: Judgment normal.      Lab Results  Component Value Date   WBC 9.1 02/04/2023   HGB 12.4 02/04/2023   HCT 37.9 02/04/2023   MCV 101.6 (H) 02/04/2023   PLT 251 02/04/2023     Chemistry      Component Value Date/Time   NA 142 12/24/2022 0925   NA 141 09/22/2016 1459   K 5.2 12/24/2022 0925   K 3.8 09/22/2016 1459   CL 105 12/24/2022 0925   CO2 19 (L) 12/24/2022 0925   CO2 26 09/22/2016 1459   BUN 35 (H) 12/24/2022 0925   BUN 19.8 09/22/2016 1459   CREATININE 0.83 12/24/2022 0925   CREATININE 0.85 12/21/2022 1220   CREATININE 1.1 09/22/2016 1459      Component Value Date/Time   CALCIUM 8.5 (L) 12/24/2022 0925   CALCIUM 9.2 09/22/2016 1459   ALKPHOS 78 12/24/2022 0925   ALKPHOS 98 09/22/2016 1459   AST 56 (H) 12/24/2022 0925   AST 22 12/21/2022 1220   AST 15 09/22/2016 1459   ALT 68 (H) 12/24/2022 0925   ALT 19 12/21/2022 1220   ALT 21 09/22/2016 1459   BILITOT 0.3 12/24/2022 0925   BILITOT 0.5 12/21/2022 1220   BILITOT 0.65 09/22/2016 1459     Encounter Diagnoses  Name Primary?   Malignant neoplasm of upper-outer quadrant of left breast in female, estrogen receptor positive (HCC) Yes   Metastasis to bone (HCC)    Impression and  Plan: Ms. Marucci is a very nice 82 year old postmenopausal white female.  She has a solitary metastatic focus from breast cancer.  Her initial breast cancer was probably 7 years ago.  It was estrogen positive. 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  The HER2 was equivocal on initial staining.  She continues to do well on Enhertu. We again reviewed red flags in terms of her SOB and cough.   Her last PET scan was done back in August showed below: Next is planned for this month. Order placed   "IMPRESSION: 1. Interval improvement in skeletal metastasis with resolution of hypermetabolic lesions in the medial LEFT clavicle and RIGHT pubic symphysis. 2. Stable mild metabolic activity in the RIGHT sacral ala. 3. No new sites of skeletal metastasis. 4. New focus of metabolic activity in the inferior RIGHT lower lobe with mild ground-glass density. Favor small focus of pulmonary infection or inflammation. 5. Moderate to large LEFT pleural effusion slightly increased in volume compared to prior. 6. Postsurgical change in the LEFT breast. No evidence of local recurrence in the LEFT breast. 7. No evidence of visceral metastasis in the abdomen pelvis."  Disposition Labs reviewed and acceptable for treatment today PET scan order placed- ASAP RTC 3 weeks MD, labs (CMP, CBC w/, CA 27.29), Enhertu   Brand Males [place], PA-C 12/5/202412:14 PM

## 2023-02-04 NOTE — Patient Instructions (Signed)
[facility] - A DEPT OF [facility]  Discharge Instructions: Thank you for choosing [facility] to provide your oncology and hematology care.   If you have a lab appointment with the Cancer Center, please go directly to the Cancer Center and check in at the registration area.  Wear comfortable clothing and clothing appropriate for easy access to any Portacath or PICC line.   We strive to give you quality time with your provider. You may need to reschedule your appointment if you arrive late (15 or more minutes).  Arriving late affects you and other patients whose appointments are after yours.  Also, if you miss three or more appointments without notifying the office, you may be dismissed from the clinic at the provider's discretion.      For prescription refill requests, have your pharmacy contact our office and allow 72 hours for refills to be completed.    Today you received the following chemotherapy and/or immunotherapy agents Enhertu      To help prevent nausea and vomiting after your treatment, we encourage you to take your nausea medication as directed.  BELOW ARE SYMPTOMS THAT SHOULD BE REPORTED IMMEDIATELY: *FEVER GREATER THAN 100.4 F (38 C) OR HIGHER *CHILLS OR SWEATING *NAUSEA AND VOMITING THAT IS NOT CONTROLLED WITH YOUR NAUSEA MEDICATION *UNUSUAL SHORTNESS OF BREATH *UNUSUAL BRUISING OR BLEEDING *URINARY PROBLEMS (pain or burning when urinating, or frequent urination) *BOWEL PROBLEMS (unusual diarrhea, constipation, pain near the anus) TENDERNESS IN MOUTH AND THROAT WITH OR WITHOUT PRESENCE OF ULCERS (sore throat, sores in mouth, or a toothache) UNUSUAL RASH, SWELLING OR PAIN  UNUSUAL VAGINAL DISCHARGE OR ITCHING   Items with * indicate a potential emergency and should be followed up as soon as possible or go to the Emergency Department if any problems should occur.  Please show the CHEMOTHERAPY ALERT CARD or IMMUNOTHERAPY  ALERT CARD at check-in to the Emergency Department and triage nurse. Should you have questions after your visit or need to cancel or reschedule your appointment, please contact [facility] - A DEPT OF [facility]  [phone] and follow the prompts.  Office hours are 8:00 a.m. to 4:30 p.m. Monday - Friday. Please note that voicemails left after 4:00 p.m. may not be returned until the following business day.  We are closed weekends and major holidays. You have access to a nurse at all times for urgent questions. Please call the main number to the clinic [phone] and follow the prompts.  For any non-urgent questions, you may also contact your provider using MyChart. We now offer e-Visits for anyone 48 and older to request care online for non-urgent symptoms. For details visit [email or web address].   Also download the MyChart app! Go to the app store, search "MyChart", open the app, select , and log in with your MyChart username and password.

## 2023-02-04 NOTE — Patient Instructions (Signed)

## 2023-02-05 DIAGNOSIS — M79605 Pain in left leg: Secondary | ICD-10-CM | POA: Diagnosis not present

## 2023-02-08 DIAGNOSIS — M6281 Muscle weakness (generalized): Secondary | ICD-10-CM | POA: Diagnosis not present

## 2023-02-09 ENCOUNTER — Encounter: Payer: Self-pay | Admitting: Hematology & Oncology

## 2023-02-11 ENCOUNTER — Encounter (HOSPITAL_COMMUNITY): Payer: Medicare Other

## 2023-02-16 DIAGNOSIS — H04123 Dry eye syndrome of bilateral lacrimal glands: Secondary | ICD-10-CM | POA: Diagnosis not present

## 2023-02-16 DIAGNOSIS — H401133 Primary open-angle glaucoma, bilateral, severe stage: Secondary | ICD-10-CM | POA: Diagnosis not present

## 2023-02-17 ENCOUNTER — Other Ambulatory Visit: Payer: Self-pay | Admitting: Nurse Practitioner

## 2023-02-17 ENCOUNTER — Encounter (HOSPITAL_COMMUNITY)
Admission: RE | Admit: 2023-02-17 | Discharge: 2023-02-17 | Disposition: A | Discharge status: Home / Self Care | Payer: Medicare Other | Source: Ambulatory Visit | Attending: Medical Oncology | Admitting: Medical Oncology

## 2023-02-17 DIAGNOSIS — C50412 Malignant neoplasm of upper-outer quadrant of left female breast: Secondary | ICD-10-CM | POA: Diagnosis not present

## 2023-02-17 DIAGNOSIS — C7951 Secondary malignant neoplasm of bone: Secondary | ICD-10-CM | POA: Diagnosis not present

## 2023-02-17 DIAGNOSIS — K219 Gastro-esophageal reflux disease without esophagitis: Secondary | ICD-10-CM

## 2023-02-17 DIAGNOSIS — Z170 Estrogen receptor positive status [ER+]: Secondary | ICD-10-CM | POA: Diagnosis not present

## 2023-02-17 LAB — GLUCOSE, CAPILLARY: Glucose-Capillary: 117 mg/dL — ABNORMAL HIGH (ref 70–99)

## 2023-02-17 MED ORDER — FLUDEOXYGLUCOSE F - 18 (FDG) INJECTION
6.6000 | Freq: Once | INTRAVENOUS | Status: AC
  Administered 2023-02-17: 6.6 via INTRAVENOUS

## 2023-02-21 ENCOUNTER — Encounter: Payer: Self-pay | Admitting: Family Medicine

## 2023-02-21 DIAGNOSIS — E032 Hypothyroidism due to medicaments and other exogenous substances: Secondary | ICD-10-CM

## 2023-02-22 ENCOUNTER — Encounter: Payer: Self-pay | Admitting: Family Medicine

## 2023-02-22 MED ORDER — THYROID 15 MG PO TABS: 15.0000 mg | ORAL_TABLET | Freq: Every day | ORAL | 1 refills | Status: DC

## 2023-02-22 MED ORDER — THYROID 60 MG PO TABS: 60.0000 mg | ORAL_TABLET | Freq: Every day | ORAL | 1 refills | Status: DC

## 2023-02-22 NOTE — Addendum Note (Signed)
Addended by: Tonny Bollman on: 02/22/2023 09:10 AM   Modules accepted: Orders

## 2023-02-23 ENCOUNTER — Other Ambulatory Visit: Payer: Self-pay

## 2023-02-23 DIAGNOSIS — C50412 Malignant neoplasm of upper-outer quadrant of left female breast: Secondary | ICD-10-CM

## 2023-02-25 ENCOUNTER — Inpatient Hospital Stay: Payer: Medicare Other

## 2023-02-25 ENCOUNTER — Encounter: Payer: Self-pay | Admitting: Hematology & Oncology

## 2023-02-25 ENCOUNTER — Inpatient Hospital Stay: Payer: Medicare Other | Admitting: Hematology & Oncology

## 2023-02-25 VITALS — BP 105/71 | HR 82 | Temp 98.2°F | Resp 18 | Ht 60.0 in | Wt 135.0 lb

## 2023-02-25 DIAGNOSIS — Z170 Estrogen receptor positive status [ER+]: Secondary | ICD-10-CM

## 2023-02-25 DIAGNOSIS — J385 Laryngeal spasm: Secondary | ICD-10-CM

## 2023-02-25 DIAGNOSIS — C3410 Malignant neoplasm of upper lobe, unspecified bronchus or lung: Secondary | ICD-10-CM

## 2023-02-25 DIAGNOSIS — I10 Essential (primary) hypertension: Secondary | ICD-10-CM

## 2023-02-25 DIAGNOSIS — J45991 Cough variant asthma: Secondary | ICD-10-CM

## 2023-02-25 DIAGNOSIS — R942 Abnormal results of pulmonary function studies: Secondary | ICD-10-CM

## 2023-02-25 DIAGNOSIS — C50412 Malignant neoplasm of upper-outer quadrant of left female breast: Secondary | ICD-10-CM | POA: Diagnosis not present

## 2023-02-25 DIAGNOSIS — H401111 Primary open-angle glaucoma, right eye, mild stage: Secondary | ICD-10-CM

## 2023-02-25 DIAGNOSIS — Z0000 Encounter for general adult medical examination without abnormal findings: Secondary | ICD-10-CM

## 2023-02-25 DIAGNOSIS — R053 Chronic cough: Secondary | ICD-10-CM

## 2023-02-25 DIAGNOSIS — I4819 Other persistent atrial fibrillation: Secondary | ICD-10-CM

## 2023-02-25 DIAGNOSIS — E032 Hypothyroidism due to medicaments and other exogenous substances: Secondary | ICD-10-CM

## 2023-02-25 DIAGNOSIS — I340 Nonrheumatic mitral (valve) insufficiency: Secondary | ICD-10-CM

## 2023-02-25 DIAGNOSIS — M810 Age-related osteoporosis without current pathological fracture: Secondary | ICD-10-CM

## 2023-02-25 DIAGNOSIS — M898X5 Other specified disorders of bone, thigh: Secondary | ICD-10-CM

## 2023-02-25 DIAGNOSIS — K222 Esophageal obstruction: Secondary | ICD-10-CM

## 2023-02-25 DIAGNOSIS — M1990 Unspecified osteoarthritis, unspecified site: Secondary | ICD-10-CM

## 2023-02-25 DIAGNOSIS — J810 Acute pulmonary edema: Secondary | ICD-10-CM

## 2023-02-25 DIAGNOSIS — Z8673 Personal history of transient ischemic attack (TIA), and cerebral infarction without residual deficits: Secondary | ICD-10-CM

## 2023-02-25 DIAGNOSIS — Z124 Encounter for screening for malignant neoplasm of cervix: Secondary | ICD-10-CM

## 2023-02-25 DIAGNOSIS — Z79899 Other long term (current) drug therapy: Secondary | ICD-10-CM

## 2023-02-25 DIAGNOSIS — Z17421 Hormone receptor negative with human epidermal growth factor receptor 2 negative status: Secondary | ICD-10-CM | POA: Diagnosis not present

## 2023-02-25 DIAGNOSIS — R413 Other amnesia: Secondary | ICD-10-CM

## 2023-02-25 DIAGNOSIS — I4891 Unspecified atrial fibrillation: Secondary | ICD-10-CM

## 2023-02-25 DIAGNOSIS — I6389 Other cerebral infarction: Secondary | ICD-10-CM

## 2023-02-25 DIAGNOSIS — Z171 Estrogen receptor negative status [ER-]: Secondary | ICD-10-CM | POA: Diagnosis not present

## 2023-02-25 DIAGNOSIS — Z5112 Encounter for antineoplastic immunotherapy: Secondary | ICD-10-CM | POA: Diagnosis not present

## 2023-02-25 DIAGNOSIS — N1831 Chronic kidney disease, stage 3a: Secondary | ICD-10-CM

## 2023-02-25 DIAGNOSIS — I351 Nonrheumatic aortic (valve) insufficiency: Secondary | ICD-10-CM

## 2023-02-25 DIAGNOSIS — J90 Pleural effusion, not elsewhere classified: Secondary | ICD-10-CM

## 2023-02-25 DIAGNOSIS — R7303 Prediabetes: Secondary | ICD-10-CM

## 2023-02-25 DIAGNOSIS — Z85118 Personal history of other malignant neoplasm of bronchus and lung: Secondary | ICD-10-CM

## 2023-02-25 DIAGNOSIS — E559 Vitamin D deficiency, unspecified: Secondary | ICD-10-CM

## 2023-02-25 DIAGNOSIS — C7951 Secondary malignant neoplasm of bone: Secondary | ICD-10-CM | POA: Diagnosis not present

## 2023-02-25 DIAGNOSIS — I517 Cardiomegaly: Secondary | ICD-10-CM

## 2023-02-25 DIAGNOSIS — I5033 Acute on chronic diastolic (congestive) heart failure: Secondary | ICD-10-CM

## 2023-02-25 DIAGNOSIS — R8271 Bacteriuria: Secondary | ICD-10-CM

## 2023-02-25 DIAGNOSIS — I5032 Chronic diastolic (congestive) heart failure: Secondary | ICD-10-CM

## 2023-02-25 DIAGNOSIS — E042 Nontoxic multinodular goiter: Secondary | ICD-10-CM

## 2023-02-25 DIAGNOSIS — K219 Gastro-esophageal reflux disease without esophagitis: Secondary | ICD-10-CM

## 2023-02-25 DIAGNOSIS — R4182 Altered mental status, unspecified: Secondary | ICD-10-CM

## 2023-02-25 DIAGNOSIS — I639 Cerebral infarction, unspecified: Secondary | ICD-10-CM

## 2023-02-25 DIAGNOSIS — K5730 Diverticulosis of large intestine without perforation or abscess without bleeding: Secondary | ICD-10-CM

## 2023-02-25 DIAGNOSIS — M8580 Other specified disorders of bone density and structure, unspecified site: Secondary | ICD-10-CM

## 2023-02-25 DIAGNOSIS — N951 Menopausal and female climacteric states: Secondary | ICD-10-CM

## 2023-02-25 DIAGNOSIS — I272 Pulmonary hypertension, unspecified: Secondary | ICD-10-CM

## 2023-02-25 DIAGNOSIS — D72829 Elevated white blood cell count, unspecified: Secondary | ICD-10-CM

## 2023-02-25 DIAGNOSIS — H401122 Primary open-angle glaucoma, left eye, moderate stage: Secondary | ICD-10-CM

## 2023-02-25 DIAGNOSIS — G40909 Epilepsy, unspecified, not intractable, without status epilepticus: Secondary | ICD-10-CM

## 2023-02-25 LAB — CBC WITH DIFFERENTIAL (CANCER CENTER ONLY)
Abs Immature Granulocytes: 0.06 10*3/uL (ref 0.00–0.07)
Basophils Absolute: 0 10*3/uL (ref 0.0–0.1)
Basophils Relative: 1 %
Eosinophils Absolute: 0 10*3/uL (ref 0.0–0.5)
Eosinophils Relative: 0 %
HCT: 34.9 % — ABNORMAL LOW (ref 36.0–46.0)
Hemoglobin: 11.5 g/dL — ABNORMAL LOW (ref 12.0–15.0)
Immature Granulocytes: 1 %
Lymphocytes Relative: 17 %
Lymphs Abs: 0.9 10*3/uL (ref 0.7–4.0)
MCH: 33 pg (ref 26.0–34.0)
MCHC: 33 g/dL (ref 30.0–36.0)
MCV: 100.3 fL — ABNORMAL HIGH (ref 80.0–100.0)
Monocytes Absolute: 0.7 10*3/uL (ref 0.1–1.0)
Monocytes Relative: 13 %
Neutro Abs: 3.4 10*3/uL (ref 1.7–7.7)
Neutrophils Relative %: 68 %
Platelet Count: 199 10*3/uL (ref 150–400)
RBC: 3.48 MIL/uL — ABNORMAL LOW (ref 3.87–5.11)
RDW: 15.5 % (ref 11.5–15.5)
WBC Count: 5 10*3/uL (ref 4.0–10.5)
nRBC: 0 % (ref 0.0–0.2)

## 2023-02-25 LAB — CMP (CANCER CENTER ONLY)
ALT: 21 U/L (ref 0–44)
AST: 22 U/L (ref 15–41)
Albumin: 3.6 g/dL (ref 3.5–5.0)
Alkaline Phosphatase: 68 U/L (ref 38–126)
Anion gap: 7 (ref 5–15)
BUN: 21 mg/dL (ref 8–23)
CO2: 28 mmol/L (ref 22–32)
Calcium: 9 mg/dL (ref 8.9–10.3)
Chloride: 105 mmol/L (ref 98–111)
Creatinine: 0.82 mg/dL (ref 0.44–1.00)
GFR, Estimated: >60 mL/min (ref >60)
Glucose, Bld: 120 mg/dL — ABNORMAL HIGH (ref 70–99)
Potassium: 4.2 mmol/L (ref 3.5–5.1)
Sodium: 140 mmol/L (ref 135–145)
Total Bilirubin: 0.5 mg/dL (ref <1.2)
Total Protein: 5.9 g/dL — ABNORMAL LOW (ref 6.5–8.1)

## 2023-02-25 LAB — LACTATE DEHYDROGENASE: LDH: 252 U/L — ABNORMAL HIGH (ref 98–192)

## 2023-02-25 MED ORDER — ACETAMINOPHEN 325 MG PO TABS
650.0000 mg | ORAL_TABLET | Freq: Once | ORAL | Status: AC
  Administered 2023-02-25: 650 mg via ORAL
  Filled 2023-02-25: qty 2

## 2023-02-25 MED ORDER — SODIUM CHLORIDE 0.9% FLUSH
10.0000 mL | INTRAVENOUS | Status: DC | PRN
  Administered 2023-02-25: 10 mL

## 2023-02-25 MED ORDER — SODIUM CHLORIDE 0.9 % IV SOLN
150.0000 mg | Freq: Once | INTRAVENOUS | Status: AC
  Administered 2023-02-25: 150 mg via INTRAVENOUS
  Filled 2023-02-25: qty 150

## 2023-02-25 MED ORDER — HEPARIN SOD (PORK) LOCK FLUSH 100 UNIT/ML IV SOLN
500.0000 [IU] | Freq: Once | INTRAVENOUS | Status: AC | PRN
  Administered 2023-02-25: 500 [IU]

## 2023-02-25 MED ORDER — PALONOSETRON HCL INJECTION 0.25 MG/5ML
0.2500 mg | Freq: Once | INTRAVENOUS | Status: AC
  Administered 2023-02-25: 0.25 mg via INTRAVENOUS
  Filled 2023-02-25: qty 5

## 2023-02-25 MED ORDER — DENOSUMAB 120 MG/1.7ML ~~LOC~~ SOLN
120.0000 mg | Freq: Once | SUBCUTANEOUS | Status: DC
Start: 2023-02-25 — End: 2023-02-25

## 2023-02-25 MED ORDER — DIPHENHYDRAMINE HCL 25 MG PO CAPS
25.0000 mg | ORAL_CAPSULE | Freq: Once | ORAL | Status: AC
Start: 2023-02-25 — End: 2023-02-25
  Administered 2023-02-25: 25 mg via ORAL
  Filled 2023-02-25: qty 1

## 2023-02-25 MED ORDER — HEPARIN SOD (PORK) LOCK FLUSH 100 UNIT/ML IV SOLN: 500.0000 [IU] | Freq: Once | INTRAVENOUS | Status: DC

## 2023-02-25 MED ORDER — SODIUM CHLORIDE 0.9% FLUSH
10.0000 mL | INTRAVENOUS | Status: DC | PRN
  Administered 2023-02-25: 10 mL via INTRAVENOUS

## 2023-02-25 MED ORDER — DEXAMETHASONE SODIUM PHOSPHATE 10 MG/ML IJ SOLN
10.0000 mg | Freq: Once | INTRAMUSCULAR | Status: AC
  Administered 2023-02-25: 10 mg via INTRAVENOUS
  Filled 2023-02-25: qty 1

## 2023-02-25 MED ORDER — DEXTROSE 5 % IV SOLN: Freq: Once | INTRAVENOUS | Status: AC

## 2023-02-25 MED ORDER — FAM-TRASTUZUMAB DERUXTECAN-NXKI CHEMO 100 MG IV SOLR
300.0000 mg | Freq: Once | INTRAVENOUS | Status: AC
  Administered 2023-02-25: 300 mg via INTRAVENOUS
  Filled 2023-02-25: qty 15

## 2023-02-25 NOTE — Patient Instructions (Signed)

## 2023-02-25 NOTE — Progress Notes (Signed)
Hematology and Oncology Follow Up Visit  Patricia [name] [id number] 12/29/1940 82 y.o. 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  She is doing quite well.  She is a very nice Thanksgiving and Christmas.  She did have a PET scan that was done.  The PET scan was done on 02/17/2023.  Thankfully, the PET scan did not show any evidence of active disease.  The only thing that may have troubled may was there is still the persistent moderate size left pleural effusion.  She has had this drained in the past.  She is not complaining of any shortness of breath.  There is no chest wall pain.  There is no heaviness.  She has had no change in bowel or bladder habits..  She gets around okay.  She does use a cane.  Her last CA 27.29 back in November was 25, and holding steady.  She is eating okay.  She is having no problems with nausea or vomiting.  She is having no bleeding.  There is no leg swelling.  She has had no issues with fever.  She has had no palpitations.  She is on Eliquis.  Overall, I would have said that her performance status is probably ECOG 1.     Wt Readings from Last 3 Encounters:  02/25/23 135 lb (61.2 kg)  02/04/23 133 lb 12.8 oz (60.7 kg)  01/21/23 133 lb (60.3 kg)   Medications:  Current Outpatient Medications:    albuterol (PROVENTIL HFA;VENTOLIN HFA) 108 (90 Base) MCG/ACT inhaler, Inhale 2 puffs into the lungs every 6 (six) hours as needed for wheezing or shortness of breath., Disp: , Rfl:    apixaban (ELIQUIS) 5 MG TABS tablet, Take 1 tablet (5 mg total) by mouth 2 (two) times daily., Disp: 180  tablet, Rfl: 3   Apoaequorin (PREVAGEN) 10 MG CAPS, Take 1 capsule by mouth every morning., Disp: 90 capsule, Rfl: 3   atorvastatin (LIPITOR) 40 MG tablet, TAKE 1 TABLET DAILY BEFORE SUPPER, Disp: 90 tablet, Rfl: 1   bimatoprost (LUMIGAN) 0.01 % SOLN, Place 1 drop into both eyes 2 (two) times daily., Disp: , Rfl:    calcium-vitamin D (OSCAL WITH D) 500-200 MG-UNIT tablet, Take 1 tablet by mouth daily with breakfast., Disp: 30 tablet, Rfl: 0   dexamethasone (DECADRON) 4 MG tablet, Take 2 tablets (8 mg) by mouth daily for 3 days starting the day after chemotherapy. Take with food., Disp: 30 tablet, Rfl: 1   digoxin (LANOXIN) 0.125 MG tablet, Take 1 tablet (0.125 mg total) by mouth daily., Disp: 90 tablet, Rfl: 3   dorzolamide-timolol (COSOPT) 22.3-6.8 MG/ML ophthalmic solution, Place 1 drop into both eyes at bedtime., Disp: , Rfl:    fluticasone (FLONASE) 50 MCG/ACT nasal spray, 1 spray each nostril following sinus rinses twice daily, Disp: 16 g, Rfl: 2   fluticasone-salmeterol (ADVAIR HFA) 230-21 MCG/ACT inhaler, USE 2 INHALATIONS TWICE A DAY, Disp: 36 g, Rfl: 3   folic acid (FOLVITE) 400 MCG tablet, Take 400 mcg by mouth daily., Disp: , Rfl:    furosemide (LASIX) 20 MG tablet, Take 1 tablet (20 mg total)  by mouth 2 (two) times daily. Take additional 20 mg as needed for swelling, Disp: 180 tablet, Rfl: 2   gabapentin (NEURONTIN) 400 MG capsule, TAKE 1 CAPSULE TWICE A DAY, Disp: 180 capsule, Rfl: 3   L-Methylfolate-B12-B6-B2 (CEREFOLIN) 07-31-48-5 MG TABS, Take 1 tablet by mouth every morning., Disp: 90 tablet, Rfl: 3   levETIRAcetam (KEPPRA XR) 500 MG 24 hr tablet, TAKE 1 TABLET AT BEDTIME, Disp: 90 tablet, Rfl: 3   lidocaine-prilocaine (EMLA) cream, Apply a dime size of cream to port-a-cath 1-2 hours prior to access. Cover with Warren Lacy., Disp: 30 g, Rfl: 3   loratadine (CLARITIN) 10 MG tablet, Take 10 mg by mouth daily., Disp: , Rfl:    memantine (NAMENDA) 10 MG tablet, TAKE 1 TABLET TWICE A DAY,  Disp: 180 tablet, Rfl: 3   metoprolol tartrate (LOPRESSOR) 50 MG tablet, Take 1 tablet (50 mg total) by mouth 2 (two) times daily., Disp: 180 tablet, Rfl: 3   Multiple Vitamins-Iron (MULTIVITAMIN/IRON) TABS, Take 1 tablet by mouth daily., Disp: , Rfl:    Netarsudil Dimesylate (RHOPRESSA) 0.02 % SOLN, Place 1 drop into both eyes at bedtime., Disp: , Rfl:    ondansetron (ZOFRAN) 8 MG tablet, Take 1 tablet (8 mg total) by mouth every 8 (eight) hours as needed for nausea or vomiting. Start on the third day after chemotherapy., Disp: 30 tablet, Rfl: 1   OVER THE COUNTER MEDICATION, Take 1 tablet by mouth daily. Zyflamend otc herbal supplement, Disp: , Rfl:    pantoprazole (PROTONIX) 40 MG tablet, TAKE 1 TABLET DAILY, Disp: 90 tablet, Rfl: 3   potassium chloride SA (KLOR-CON M) 20 MEQ tablet, Take 1 tablet (20 mEq total) by mouth daily., Disp: 90 tablet, Rfl: 3   prochlorperazine (COMPAZINE) 10 MG tablet, Take 1 tablet (10 mg total) by mouth every 6 (six) hours as needed for nausea or vomiting., Disp: 30 tablet, Rfl: 1   thyroid (ARMOUR THYROID) 15 MG tablet, Take 1 tablet (15 mg total) by mouth daily. Take with 60mg  for a total dose of 75mg  daily., Disp: 90 tablet, Rfl: 1   thyroid (ARMOUR THYROID) 60 MG tablet, Take 1 tablet (60 mg total) by mouth daily. Takes with 15mg  daily., Disp: 90 tablet, Rfl: 1   traMADol (ULTRAM) 50 MG tablet, Take 1 tablet (50 mg total) by mouth every 6 (six) hours as needed (post op pain)., Disp: 60 tablet, Rfl: 0   Cholecalciferol (VITAMIN D3) 50 MCG (2000 UT) capsule, Take 2,000 Units by mouth daily. (Patient not taking: Reported on 02/04/2023), Disp: , Rfl:  No current facility-administered medications for this visit.  Facility-Administered Medications Ordered in Other Visits:    acetaminophen (TYLENOL) tablet 650 mg, 650 mg, Oral, Once, Abisai Coble, Rose Phi, MD   dexamethasone (DECADRON) 10 mg in sodium chloride 0.9 % 50 mL IVPB, 10 mg, Intravenous, Once, Neyra Pettie, Rose Phi, MD    dextrose 5 % solution, , Intravenous, Once, Coty Larsh, Rose Phi, MD   diphenhydrAMINE (BENADRYL) capsule 50 mg, 50 mg, Oral, Once, Kaysie Michelini, Rose Phi, MD   fosaprepitant (EMEND) 150 mg in sodium chloride 0.9 % 145 mL IVPB, 150 mg, Intravenous, Once, Chai Routh, Rose Phi, MD   palonosetron (ALOXI) injection 0.25 mg, 0.25 mg, Intravenous, Once, Laylani Pudwill, Rose Phi, MD  Allergies:  Allergies  Allergen Reactions   Combigan [Brimonidine Tartrate-Timolol] Itching    Per patient made eyes red, sore, and sensitivity to light   Sulfa Antibiotics Hives and Rash   Sulfamethoxazole-Trimethoprim Hives    Past Medical  History, Surgical history, Social history, and Family History were reviewed and updated.  Review of Systems: Review of Systems  Constitutional:  Positive for fatigue. Negative for appetite change.  HENT:  Negative.    Eyes: Negative.   Respiratory:  Positive for shortness of breath (chronic and stable). Negative for chest tightness.   Cardiovascular: Negative.   Gastrointestinal: Negative.   Endocrine: Negative.   Genitourinary: Negative.    Musculoskeletal:  Positive for gait problem.  Skin: Negative.   Neurological:  Positive for dizziness and gait problem. Negative for headaches and light-headedness.  Hematological: Negative.   Psychiatric/Behavioral: Negative.      Physical Exam:  height is 5' (1.524 m) and weight is 135 lb (61.2 kg). Her oral temperature is 98.2 F (36.8 C). Her blood pressure is 105/71 and her pulse is 82. Her respiration is 18 and oxygen saturation is 97%.   Wt Readings from Last 3 Encounters:  02/25/23 135 lb (61.2 kg)  02/04/23 133 lb 12.8 oz (60.7 kg)  01/21/23 133 lb (60.3 kg)    Physical Exam Vitals reviewed.  HENT:     Head: Normocephalic and atraumatic.  Eyes:     Pupils: Pupils are equal, round, and reactive to light.  Cardiovascular:     Rate and Rhythm: Normal rate and regular rhythm.     Heart sounds: Normal heart sounds.  Pulmonary:      Effort: Pulmonary effort is normal.     Breath sounds: Normal breath sounds.  Abdominal:     General: Bowel sounds are normal.     Palpations: Abdomen is soft.  Musculoskeletal:        General: No tenderness or deformity. Normal range of motion.     Cervical back: Normal range of motion.     Left lower leg: No edema.  Lymphadenopathy:     Cervical: No cervical adenopathy.  Skin:    General: Skin is warm and dry.     Findings: No erythema or rash.  Neurological:     Mental Status: She is alert and oriented to person, place, and time.  Psychiatric:        Behavior: Behavior normal.        Thought Content: Thought content normal.        Judgment: Judgment normal.     Lab Results  Component Value Date   WBC 5.0 02/25/2023   HGB 11.5 (L) 02/25/2023   HCT 34.9 (L) 02/25/2023   MCV 100.3 (H) 02/25/2023   PLT 199 02/25/2023     Chemistry      Component Value Date/Time   NA 141 02/04/2023 1140   NA 142 12/24/2022 0925   NA 141 09/22/2016 1459   K 4.0 02/04/2023 1140   K 3.8 09/22/2016 1459   CL 104 02/04/2023 1140   CO2 28 02/04/2023 1140   CO2 26 09/22/2016 1459   BUN 25 (H) 02/04/2023 1140   BUN 35 (H) 12/24/2022 0925   BUN 19.8 09/22/2016 1459   CREATININE 0.86 02/04/2023 1140   CREATININE 1.1 09/22/2016 1459      Component Value Date/Time   CALCIUM 9.3 02/04/2023 1140   CALCIUM 9.2 09/22/2016 1459   ALKPHOS 61 02/04/2023 1140   ALKPHOS 98 09/22/2016 1459   AST 22 02/04/2023 1140   AST 15 09/22/2016 1459   ALT 17 02/04/2023 1140   ALT 21 09/22/2016 1459   BILITOT 0.6 02/04/2023 1140   BILITOT 0.65 09/22/2016 1459       Impression and  Plan: Ms. Champoux is a very nice 82 year old postmenopausal white female.  She has a solitary metastatic focus from breast cancer.  Her initial breast cancer was probably 7 years ago.  It was estrogen positive. 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  The HER2 was equivocal on initial staining.  She continues to do well on  Enhertu.  I think that the PET scan certainly helps confirm this.  Right now, I am not see any toxicity from the Enhertu.  She does not have any cough or shortness of breath.  There is nothing on the PET scan that looks like an official pneumonitis.  She will get her Rivka Barbara today.  We will continue her on the Enhertu.  We will plan to have her come back to see [name] in another 3 weeks.    Josph Macho, MD 12/26/202412:49 PM

## 2023-02-25 NOTE — Patient Instructions (Signed)
 [facility] - A DEPT OF [facility]  Discharge Instructions: Thank you for choosing [facility] to provide your oncology and hematology care.   If you have a lab appointment with the Cancer Center, please go directly to the Cancer Center and check in at the registration area.  Wear comfortable clothing and clothing appropriate for easy access to any Portacath or PICC line.   We strive to give you quality time with your provider. You may need to reschedule your appointment if you arrive late (15 or more minutes).  Arriving late affects you and other patients whose appointments are after yours.  Also, if you miss three or more appointments without notifying the office, you may be dismissed from the clinic at the provider's discretion.      For prescription refill requests, have your pharmacy contact our office and allow 72 hours for refills to be completed.    Today you received the following chemotherapy and/or immunotherapy agents Enhertu      To help prevent nausea and vomiting after your treatment, we encourage you to take your nausea medication as directed.  BELOW ARE SYMPTOMS THAT SHOULD BE REPORTED IMMEDIATELY: *FEVER GREATER THAN 100.4 F (38 C) OR HIGHER *CHILLS OR SWEATING *NAUSEA AND VOMITING THAT IS NOT CONTROLLED WITH YOUR NAUSEA MEDICATION *UNUSUAL SHORTNESS OF BREATH *UNUSUAL BRUISING OR BLEEDING *URINARY PROBLEMS (pain or burning when urinating, or frequent urination) *BOWEL PROBLEMS (unusual diarrhea, constipation, pain near the anus) TENDERNESS IN MOUTH AND THROAT WITH OR WITHOUT PRESENCE OF ULCERS (sore throat, sores in mouth, or a toothache) UNUSUAL RASH, SWELLING OR PAIN  UNUSUAL VAGINAL DISCHARGE OR ITCHING   Items with * indicate a potential emergency and should be followed up as soon as possible or go to the Emergency Department if any problems should occur.  Please show the CHEMOTHERAPY ALERT CARD or IMMUNOTHERAPY  ALERT CARD at check-in to the Emergency Department and triage nurse. Should you have questions after your visit or need to cancel or reschedule your appointment, please contact [facility] - A DEPT OF [facility]  [phone] and follow the prompts.  Office hours are 8:00 a.m. to 4:30 p.m. Monday - Friday. Please note that voicemails left after 4:00 p.m. may not be returned until the following business day.  We are closed weekends and major holidays. You have access to a nurse at all times for urgent questions. Please call the main number to the clinic [phone] and follow the prompts.  For any non-urgent questions, you may also contact your provider using MyChart. We now offer e-Visits for anyone 48 and older to request care online for non-urgent symptoms. For details visit [email or web address].   Also download the MyChart app! Go to the app store, search "MyChart", open the app, select , and log in with your MyChart username and password.

## 2023-02-26 LAB — CANCER ANTIGEN 27.29: CA 27.29: 19.7 U/mL (ref 0.0–38.6)

## 2023-03-10 ENCOUNTER — Other Ambulatory Visit: Payer: Self-pay | Admitting: Adult Health

## 2023-03-10 NOTE — Progress Notes (Signed)
 [facility] [address] [place]. [place] [address] [phone]       OFFICE FOLLOW-UP NOTE  [name] Date of Birth:  September 17, 1940 Medical Record Number:  [id number]   Follow-up visit:  Prior visit: 08/25/2022 with Dr. Rosemarie  Brief HPI:   Patricia [name] is a 83 y.o. 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 Other PMH include A-fib on Eliquis , hx of breast cancer with metastasis to the bone s/p radiation, OSA on BiPAP, HTN, HLD, CKD, and diastolic heart failure.  At prior visit with Dr. Rosemarie 6 months ago, she overall is stable from neurological standpoint.  Continued on Keppra  for seizure prophylaxis and Cerefolin, Prevagen and memantine  for cognition. MMSE 28/30 (prior 27/30).     Interval history:  Returns today for follow-up visit accompanied by her husband who provides majority of history.  Reports cognition has slightly declined since prior visit. MMSE 23/30 (prior 28/30).  Continues on memantine , Cerefolin and Prevagen.  She is relatively sedentary especially since treatment back in May for left femur pain (s/p intramedullary nail femoral).  She was working with PT but this was stopped as patient frequently refused to go and would not do the exercises at home.  Husband tries to encourage her to exercise but this can be challenging.  She ambulates with a cane most of the time, will use RW at night, sometimes will use furniture while at home, no recent falls. She routinely does sudoku. Was previously playing bridge, patient reports she just played last month but per husband, has not played in over a year.  Reports she sleeps well and has a good appetite.  No behavioral concerns.  Denies any seizure activity on Keppra , denies side effects.  No new stroke/TIA symptoms.  Remains on Eliquis  and atorvastatin .  Routinely follows with  PCP and cardiology.       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 Dx 04/2015 at time of stroke. b. DCCV 06/2015 - did not hold. Amio started then stopped due to QT prolongation.   Chronic diastolic heart failure (HCC) 06/17/2015   CKD (chronic kidney disease), stage III (HCC)    Cough variant asthma    Dementia (HCC)    Diverticulosis    Essential hypertension    GERD (gastroesophageal reflux disease)    Glaucoma    Hiatal hernia    Hyperlipidemia    Hypothyroidism    Internal hemorrhoids    Laryngospasm    Mitral regurgitation    mild to moderate by echo 12/2019 and moderate by TEE 12/2020   Multinodular goiter    Osteopenia    Postmenopausal    Pulmonary HTN (HCC)     Moderate with PASP by echo 2018 likely Group 2 from pulmonary venous HTN from CHF>>normal PAP by echo 12/2019   Radiation 10/22/14-11/23/14   Left Breast   Stricture and stenosis of cervix    Stroke [facility])    TIAs    Social History:  Social History  Socioeconomic History   Marital status: Married    Spouse name: Carlin   Number of children: 2   Years of education: Not on file   Highest education level: 12th grade  Occupational History   Occupation: Retired  Tobacco Use   Smoking status: Former    Current packs/day: 0.00    Average packs/day: 0.8 packs/day for 5.0 years (3.8 ttl pk-yrs)    Types: Cigarettes    Start date: 06/14/1961    Quit date: 06/15/1966    Years since quitting: 56.7    Passive exposure: Past   Smokeless tobacco: Never  Vaping Use   Vaping status: Never Used  Substance and Sexual Activity   Alcohol  use: Not Currently    Comment: occasional wine   Drug use: No   Sexual  activity: Not Currently    Birth control/protection: Post-menopausal  Other Topics Concern   Not on file  Social History Narrative   Retired. Lives with husband.    Right Handed   Drinks 1-2 cups caffeine daily   Social Drivers of Health   Financial Resource Strain: Low Risk  (12/28/2022)   Overall Financial Resource Strain (CARDIA)    Difficulty of Paying Living Expenses: Not hard at all  Food Insecurity: No Food Insecurity (01/16/2023)   Hunger Vital Sign    Worried About Running Out of Food in the Last Year: Never true    Ran Out of Food in the Last Year: Never true  Transportation Needs: No Transportation Needs (01/16/2023)   PRAPARE - Administrator, Civil Service (Medical): No    Lack of Transportation (Non-Medical): No  Physical Activity: Inactive (01/16/2023)   Exercise Vital Sign    Days of Exercise per Week: 0 days    Minutes of Exercise per Session: 40 min  Stress: No Stress Concern Present (01/16/2023)   [employer] of Occupational Health - Occupational Stress Questionnaire    Feeling of Stress : Only a little  Social Connections: Moderately Integrated (01/16/2023)   Social Connection and Isolation Panel [NHANES]    Frequency of Communication with Friends and Family: Once a week    Frequency of Social Gatherings with Friends and Family: Once a week    Attends Religious Services: More than 4 times per year    Active Member of [employer] or Organizations: Yes    Attends Engineer, Structural: More than 4 times per year    Marital Status: Married  Catering Manager Violence: Not At Risk (10/29/2022)   Humiliation, Afraid, Rape, and Kick questionnaire    Fear of Current or Ex-Partner: No    Emotionally Abused: No    Physically Abused: No    Sexually Abused: No    Medications:   Current Outpatient Medications on File Prior to Visit  Medication Sig Dispense Refill   albuterol  (PROVENTIL  HFA;VENTOLIN  HFA) 108 (90 Base) MCG/ACT inhaler Inhale 2  puffs into the lungs every 6 (six) hours as needed for wheezing or shortness of breath.     apixaban  (ELIQUIS ) 5 MG TABS tablet Take 1 tablet (5 mg total) by mouth 2 (two) times daily. 180 tablet 3   Apoaequorin (PREVAGEN) 10 MG CAPS Take 1 capsule by mouth every morning. 90 capsule 3   atorvastatin  (LIPITOR) 40 MG tablet TAKE 1 TABLET DAILY BEFORE SUPPER 90 tablet 1   bimatoprost (LUMIGAN) 0.01 % SOLN Place 1 drop into both eyes 2 (two) times daily.     calcium -vitamin D  (OSCAL WITH D) 500-200 MG-UNIT tablet  Take 1 tablet by mouth daily with breakfast. 30 tablet 0   dexamethasone  (DECADRON ) 4 MG tablet Take 2 tablets (8 mg) by mouth daily for 3 days starting the day after chemotherapy. Take with food. 30 tablet 1   digoxin  (LANOXIN ) 0.125 MG tablet Take 1 tablet (0.125 mg total) by mouth daily. 90 tablet 3   dorzolamide -timolol  (COSOPT ) 22.3-6.8 MG/ML ophthalmic solution Place 1 drop into both eyes at bedtime.     fluticasone  (FLONASE ) 50 MCG/ACT nasal spray 1 spray each nostril following sinus rinses twice daily 16 g 2   fluticasone -salmeterol (ADVAIR  HFA) 230-21 MCG/ACT inhaler USE 2 INHALATIONS TWICE A DAY 36 g 3   folic acid  (FOLVITE ) 400 MCG tablet Take 400 mcg by mouth daily.     furosemide  (LASIX ) 20 MG tablet Take 1 tablet (20 mg total) by mouth 2 (two) times daily. Take additional 20 mg as needed for swelling 180 tablet 2   gabapentin  (NEURONTIN ) 400 MG capsule TAKE 1 CAPSULE TWICE A DAY 180 capsule 3   L-Methylfolate-B12-B6-B2 (CEREFOLIN) 07-31-48-5 MG TABS Take 1 tablet by mouth every morning. 90 tablet 3   levETIRAcetam  (KEPPRA  XR) 500 MG 24 hr tablet TAKE 1 TABLET AT BEDTIME 90 tablet 3   lidocaine -prilocaine  (EMLA ) cream Apply a dime size of cream to port-a-cath 1-2 hours prior to access. Cover with [employer]. 30 g 3   loratadine  (CLARITIN ) 10 MG tablet Take 10 mg by mouth daily.     memantine  (NAMENDA ) 10 MG tablet TAKE 1 TABLET TWICE A DAY 180 tablet 3   metoprolol  tartrate  (LOPRESSOR ) 50 MG tablet Take 1 tablet (50 mg total) by mouth 2 (two) times daily. 180 tablet 3   Multiple Vitamins-Iron  (MULTIVITAMIN/IRON ) TABS Take 1 tablet by mouth daily.     Netarsudil  Dimesylate (RHOPRESSA) 0.02 % SOLN Place 1 drop into both eyes at bedtime.     ondansetron  (ZOFRAN ) 8 MG tablet Take 1 tablet (8 mg total) by mouth every 8 (eight) hours as needed for nausea or vomiting. Start on the third day after chemotherapy. 30 tablet 1   OVER THE COUNTER MEDICATION Take 1 tablet by mouth daily. Zyflamend otc herbal supplement     pantoprazole  (PROTONIX ) 40 MG tablet TAKE 1 TABLET DAILY 90 tablet 3   potassium chloride  SA (KLOR-CON  M) 20 MEQ tablet Take 1 tablet (20 mEq total) by mouth daily. 90 tablet 3   prochlorperazine  (COMPAZINE ) 10 MG tablet Take 1 tablet (10 mg total) by mouth every 6 (six) hours as needed for nausea or vomiting. 30 tablet 1   thyroid  (ARMOUR THYROID ) 15 MG tablet Take 1 tablet (15 mg total) by mouth daily. Take with 60mg  for a total dose of 75mg  daily. 90 tablet 1   thyroid  (ARMOUR THYROID ) 60 MG tablet Take 1 tablet (60 mg total) by mouth daily. Takes with 15mg  daily. 90 tablet 1   traMADol  (ULTRAM ) 50 MG tablet Take 1 tablet (50 mg total) by mouth every 6 (six) hours as needed (post op pain). 60 tablet 0   Cholecalciferol  (VITAMIN D3) 50 MCG (2000 UT) capsule Take 2,000 Units by mouth daily. (Patient not taking: Reported on 02/04/2023)     Current Facility-Administered Medications on File Prior to Visit  Medication Dose Route Frequency Provider Last Rate Last Admin   acetaminophen  (TYLENOL ) tablet 650 mg  650 mg Oral Once Ennever, Peter R, MD       dexamethasone  (DECADRON ) 10 mg in sodium chloride  0.9 % 50 mL IVPB  10 mg Intravenous Once Ennever, Peter R, MD       dextrose  5 % solution   Intravenous Once Ennever, Peter R, MD       diphenhydrAMINE  (BENADRYL ) capsule 50 mg  50 mg Oral Once Ennever, Peter R, MD       fosaprepitant  (EMEND) 150 mg in sodium chloride   0.9 % 145 mL IVPB  150 mg Intravenous Once Ennever, Peter R, MD       palonosetron  (ALOXI ) injection 0.25 mg  0.25 mg Intravenous Once Ennever, Peter R, MD        Allergies:   Allergies  Allergen Reactions   Combigan [Brimonidine Tartrate-Timolol ] Itching    Per patient made eyes red, sore, and sensitivity to light   Sulfa Antibiotics Hives and Rash   Sulfamethoxazole-Trimethoprim Hives    Physical Exam Today's Vitals   03/11/23 1347  BP: 123/71  Pulse: 85  Weight: 153 lb (69.4 kg)  Height: 5' 1 (1.549 m)   Body mass index is 28.91 kg/m.  General: Petite elderly very pleasant Caucasian lady, seated, in no evident distress Head: head normocephalic and atraumatic.  Neck: supple with no carotid or supraclavicular bruits Cardiovascular: irregular rate and rhythm, no murmurs Musculoskeletal: Mild kyphoscoliosis Skin:  no rash/petichiae Vascular:  Normal pulses all extremities  Neurologic Exam Mental Status: Awake and fully alert.  Fluent speech and language.  Recent memory impaired and remote memory intact. Attention span, concentration and fund of knowledge slightly diminished. Mood and affect appropriate.      03/11/2023    1:55 PM 08/25/2022    1:49 PM 02/10/2022    8:02 AM 08/07/2021    8:35 AM 02/04/2021    8:29 AM 09/12/2020    3:06 PM 09/27/2017    5:07 PM  MMSE - Mini Mental State Exam  Orientation to time 1 4 2  0 4 3 4   Orientation to Place 5 5 5 4 4 5 5   Registration 3 3 3 3 3 3 3   Attention/ Calculation 3 5 5 5 2 5 5   Recall 3 2 3 2 2 2 3   Language- name 2 objects 2 2 2 2 2 2 2   Language- repeat 1 1 1 1 1 1 1   Language- follow 3 step command 2 3 3 3 3 3 3   Language- read & follow direction 1 1 1 1 1 1 1   Write a sentence 1 1 1 1 1 1 1   Copy design 1 1 1 1 1 1 1   Total score 23 28 27 23 24 27 29    Cranial Nerves: Pupils equal, briskly reactive to light. Extraocular movements full without nystagmus. Visual fields full to confrontation. HOH bilaterally. Facial  sensation intact. Face, tongue, palate moves normally and symmetrically.  Motor: Normal bulk and tone. Normal strength in all tested extremity muscles except mild bilateral hip flexor weakness Sensory.: intact to touch ,pinprick .position and vibratory sensation.  Coordination: Rapid alternating movements normal in all extremities. Finger-to-nose performed accurately bilaterally. Gait and Station: Arises from chair without difficulty. Stance is normal. Gait demonstrates antalgic gait with mild unsteadiness and use of cane Reflexes: 1+ and symmetric. Toes downgoing.        ASSESSMENT/PLAN: 83 year old Caucasian lady with remote history of right MCA infarct from atrial fibrillation in February 2017.  Episode in 11/2018 of brief loss of consciousness followed by confusion possibly unwitnessed seizure with postictal confusion.  Also mild cognitive impairment with continued gradual decline of short-term memory. Episode of transient confusion  and disorientation likely related to her baseline mild cognitive impairment doubt seizure in 08/2020.  Neurocognitive evaluation 03/2021 frontal-subcortical dysfunction and some indication of possible mesial temporal lobe dysfunction with possible etiology multifactorial with combination of several vascular risk factors and signs of potential early Alzheimer's pathology. Hx of breast cancer with metastasis to the spine s/p radiation routinely followed by oncology    Hx of R MCA stroke -Continue Eliquis  5 mg twice daily and atorvastatin  40 mg daily for secondary stroke prevention measures managed/prescribed by PCP/cardiology -Continue close PCP/cardiology follow-up for aggressive stroke risk factor management  Post stroke seizure -No seizure activity -continue Keppra  XR 500 mg daily for seizure prophylaxis   Vascular dementia wo behavioral disturbance -Gradual decline in cognition over the past 6 months  -MMSE today 23/30 (prior 28/30) -start Aricept  5mg  daily  -consider increasing dose at follow-up visit -Continue Namenda  10 mg twice daily  -continue Prevagen 10 mg and  Cerefolin NAC 1 capsule daily  -Encourage increasing participation in cognitive and physical exercise as well as ensuring healthy diet and adequate sleep -Discussed importance of nightly use of BiPAP for OSA management     Follow-up in 6 months or call earlier if needed    CC:  Wallace Search A, PA   I spent 40 minutes of face-to-face and non-face-to-face time with patient and husband.  This included previsit chart review, lab review, study review, order entry, electronic health record documentation, patient and husband education and discussion regarding the above and answered all questions to patient and husband satisfaction  Harlene Bogaert, AGNP-BC  [facility] [address] [place], [place] [address]  Phone [phone] Fax [phone] Note: This document was prepared with digital dictation and possible smart phrase technology. Any transcriptional errors that result from this process are unintentional.

## 2023-03-11 ENCOUNTER — Ambulatory Visit (INDEPENDENT_AMBULATORY_CARE_PROVIDER_SITE_OTHER): Payer: Medicare Other | Admitting: Adult Health

## 2023-03-11 ENCOUNTER — Telehealth: Payer: Self-pay | Admitting: Adult Health

## 2023-03-11 ENCOUNTER — Encounter: Payer: Self-pay | Admitting: Adult Health

## 2023-03-11 VITALS — BP 123/71 | HR 85 | Ht 61.0 in | Wt 153.0 lb

## 2023-03-11 DIAGNOSIS — R569 Unspecified convulsions: Secondary | ICD-10-CM | POA: Diagnosis not present

## 2023-03-11 DIAGNOSIS — F01A0 Vascular dementia, mild, without behavioral disturbance, psychotic disturbance, mood disturbance, and anxiety: Secondary | ICD-10-CM | POA: Diagnosis not present

## 2023-03-11 DIAGNOSIS — I63411 Cerebral infarction due to embolism of right middle cerebral artery: Secondary | ICD-10-CM

## 2023-03-11 DIAGNOSIS — I639 Cerebral infarction, unspecified: Secondary | ICD-10-CM

## 2023-03-11 MED ORDER — DONEPEZIL HCL 5 MG PO TABS: 5.0000 mg | ORAL_TABLET | Freq: Every day | ORAL | 11 refills | Status: DC

## 2023-03-11 MED ORDER — CEREFOLIN 6-1-50-5 MG PO TABS: 1.0000 | ORAL_TABLET | Freq: Every morning | ORAL | 3 refills | Status: DC

## 2023-03-11 NOTE — Patient Instructions (Addendum)
 Your Plan:  Start Aricept  5mg  nightly   Continue Namenda , Prevagen and Cerefolin  Continue Keppra  for seizure prevention  Continue to follow with PCP and cardiology for stroke risk factor management    Follow-up in 6 months or call earlier if needed    Thank you for coming to see us  at [facility]. I hope we have been able to provide you high quality care today.  You may receive a patient satisfaction survey over the next few weeks. We would appreciate your feedback and comments so that we may continue to improve ourselves and the health of our patients.    Donepezil  Tablets What is this medication? DONEPEZIL  (doe NEP e zil) treats memory loss and confusion (dementia) in people who have Alzheimer disease. It works by improving attention, memory, and the ability to engage in daily activities. It is not a cure for dementia or Alzheimer disease. This medicine may be used for other purposes; ask your health care provider or pharmacist if you have questions. COMMON BRAND NAME(S): Aricept  What should I tell my care team before I take this medication? They need to know if you have any of these conditions: Asthma or other lung disease Difficulty passing urine Head injury Heart disease History of irregular heartbeat Liver disease Seizures (convulsions) Stomach or intestinal disease, ulcers or stomach bleeding An unusual or allergic reaction to donepezil , other medications, foods, dyes, or preservatives Pregnant or trying to get pregnant Breast-feeding How should I use this medication? Take this medication by mouth with a glass of water . Follow the directions on the prescription label. You may take this medication with or without food. Take this medication at regular intervals. This medication is usually taken before bedtime. Do not take it more often than directed. Continue to take your medication even if you feel better. Do not stop taking except on your care  team's advice. If you are taking the 23 mg donepezil  tablet, swallow it whole; do not cut, crush, or chew it. Talk to your care team about the use of this medication in children. Special care may be needed. Overdosage: If you think you have taken too much of this medicine contact a poison control center or emergency room at once. NOTE: This medicine is only for you. Do not share this medicine with others. What if I miss a dose? If you miss a dose, take it as soon as you can. If it is almost time for your next dose, take only that dose, do not take double or extra doses. What may interact with this medication? Do not take this medication with any of the following: Certain medications for fungal infections like itraconazole, fluconazole, posaconazole, and voriconazole Cisapride Dextromethorphan ; quinidine Dronedarone Pimozide Quinidine Thioridazine This medication may also interact with the following: Antihistamines for allergy, cough and cold Atropine Bethanechol Carbamazepine Certain medications for bladder problems like oxybutynin, tolterodine Certain medications for Parkinson's disease like benztropine, trihexyphenidyl Certain medications for stomach problems like dicyclomine, hyoscyamine Certain medications for travel sickness like scopolamine Dexamethasone  Dofetilide Ipratropium NSAIDs, medications for pain and inflammation, like ibuprofen or naproxen Other medications for Alzheimer's disease Other medications that prolong the QT interval (cause an abnormal heart rhythm) Phenobarbital Phenytoin Rifampin, rifabutin or rifapentine Ziprasidone This list may not describe all possible interactions. Give your health care provider a list of all the medicines, herbs, non-prescription drugs, or dietary supplements you use. Also tell them if you smoke, drink alcohol , or use illegal drugs. Some items may interact with your medicine.  What should I watch for while using this  medication? Visit your care team for regular checks on your progress. Check with your care team if your symptoms do not get better or if they get worse. You may get drowsy or dizzy. Do not drive, use machinery, or do anything that needs mental alertness until you know how this medication affects you. What side effects may I notice from receiving this medication? Side effects that you should report to your care team as soon as possible: Allergic reactions--skin rash, itching, hives, swelling of the face, lips, tongue, or throat Peptic ulcer--burning stomach pain, loss of appetite, bloating, burping, heartburn, nausea, vomiting Seizures Slow heartbeat--dizziness, feeling faint or lightheaded, confusion, trouble breathing, unusual weakness or fatigue Stomach bleeding--bloody or black, tar-like stools, vomiting blood or brown material that looks like coffee grounds Trouble passing urine Side effects that usually do not require medical attention (report these to your care team if they continue or are bothersome): Diarrhea Fatigue Loss of appetite Muscle pain or cramps Nausea Trouble sleeping This list may not describe all possible side effects. Call your doctor for medical advice about side effects. You may report side effects to FDA at 1-800-FDA-1088. Where should I keep my medication? Keep out of reach of children. Store at room temperature between 15 and 30 degrees C (59 and 86 degrees F). Throw away any unused medication after the expiration date. NOTE: This sheet is a summary. It may not cover all possible information. If you have questions about this medicine, talk to your doctor, pharmacist, or health care provider.  2024 Elsevier/Gold Standard (2020-10-02 00:00:00)

## 2023-03-11 NOTE — Telephone Encounter (Signed)
 FYI- Pt's husband states L-Methylfolate-B12-B6-B2 (CEREFOLIN) 07-31-48-5 MG TABS cannot be ordered through anyone but [facility] per [facility]

## 2023-03-14 ENCOUNTER — Encounter: Payer: Self-pay | Admitting: Family Medicine

## 2023-03-15 MED ORDER — CEREFOLIN 6-1-50-5 MG PO TABS: 1.0000 | ORAL_TABLET | Freq: Every morning | ORAL | 3 refills | Status: DC

## 2023-03-15 NOTE — Telephone Encounter (Signed)
 Refill has been resent to [facility] per pt request.

## 2023-03-15 NOTE — Addendum Note (Signed)
 Addended by: Judi Cong on: 03/15/2023 08:24 AM   Modules accepted: Orders

## 2023-03-18 ENCOUNTER — Inpatient Hospital Stay: Payer: Medicare Other

## 2023-03-18 ENCOUNTER — Encounter: Payer: Self-pay | Admitting: Medical Oncology

## 2023-03-18 ENCOUNTER — Inpatient Hospital Stay: Payer: Medicare Other | Attending: Hematology & Oncology

## 2023-03-18 ENCOUNTER — Inpatient Hospital Stay (HOSPITAL_BASED_OUTPATIENT_CLINIC_OR_DEPARTMENT_OTHER): Payer: Medicare Other | Admitting: Medical Oncology

## 2023-03-18 VITALS — BP 136/73 | HR 73 | Temp 98.0°F | Resp 20 | Ht 61.0 in | Wt 134.0 lb

## 2023-03-18 VITALS — BP 110/57 | HR 67

## 2023-03-18 DIAGNOSIS — E042 Nontoxic multinodular goiter: Secondary | ICD-10-CM

## 2023-03-18 DIAGNOSIS — R053 Chronic cough: Secondary | ICD-10-CM

## 2023-03-18 DIAGNOSIS — I4819 Other persistent atrial fibrillation: Secondary | ICD-10-CM

## 2023-03-18 DIAGNOSIS — M1990 Unspecified osteoarthritis, unspecified site: Secondary | ICD-10-CM

## 2023-03-18 DIAGNOSIS — I639 Cerebral infarction, unspecified: Secondary | ICD-10-CM

## 2023-03-18 DIAGNOSIS — I5032 Chronic diastolic (congestive) heart failure: Secondary | ICD-10-CM

## 2023-03-18 DIAGNOSIS — E032 Hypothyroidism due to medicaments and other exogenous substances: Secondary | ICD-10-CM

## 2023-03-18 DIAGNOSIS — C50412 Malignant neoplasm of upper-outer quadrant of left female breast: Secondary | ICD-10-CM

## 2023-03-18 DIAGNOSIS — Z170 Estrogen receptor positive status [ER+]: Secondary | ICD-10-CM

## 2023-03-18 DIAGNOSIS — R8271 Bacteriuria: Secondary | ICD-10-CM

## 2023-03-18 DIAGNOSIS — E559 Vitamin D deficiency, unspecified: Secondary | ICD-10-CM

## 2023-03-18 DIAGNOSIS — R942 Abnormal results of pulmonary function studies: Secondary | ICD-10-CM

## 2023-03-18 DIAGNOSIS — K219 Gastro-esophageal reflux disease without esophagitis: Secondary | ICD-10-CM

## 2023-03-18 DIAGNOSIS — Z124 Encounter for screening for malignant neoplasm of cervix: Secondary | ICD-10-CM

## 2023-03-18 DIAGNOSIS — R413 Other amnesia: Secondary | ICD-10-CM

## 2023-03-18 DIAGNOSIS — Z171 Estrogen receptor negative status [ER-]: Secondary | ICD-10-CM | POA: Insufficient documentation

## 2023-03-18 DIAGNOSIS — I340 Nonrheumatic mitral (valve) insufficiency: Secondary | ICD-10-CM

## 2023-03-18 DIAGNOSIS — I4891 Unspecified atrial fibrillation: Secondary | ICD-10-CM

## 2023-03-18 DIAGNOSIS — C3410 Malignant neoplasm of upper lobe, unspecified bronchus or lung: Secondary | ICD-10-CM

## 2023-03-18 DIAGNOSIS — J810 Acute pulmonary edema: Secondary | ICD-10-CM

## 2023-03-18 DIAGNOSIS — N951 Menopausal and female climacteric states: Secondary | ICD-10-CM

## 2023-03-18 DIAGNOSIS — I272 Pulmonary hypertension, unspecified: Secondary | ICD-10-CM

## 2023-03-18 DIAGNOSIS — Z5111 Encounter for antineoplastic chemotherapy: Secondary | ICD-10-CM | POA: Diagnosis not present

## 2023-03-18 DIAGNOSIS — Z79899 Other long term (current) drug therapy: Secondary | ICD-10-CM

## 2023-03-18 DIAGNOSIS — Z85118 Personal history of other malignant neoplasm of bronchus and lung: Secondary | ICD-10-CM

## 2023-03-18 DIAGNOSIS — J45991 Cough variant asthma: Secondary | ICD-10-CM

## 2023-03-18 DIAGNOSIS — Z8673 Personal history of transient ischemic attack (TIA), and cerebral infarction without residual deficits: Secondary | ICD-10-CM

## 2023-03-18 DIAGNOSIS — M8580 Other specified disorders of bone density and structure, unspecified site: Secondary | ICD-10-CM

## 2023-03-18 DIAGNOSIS — Z5112 Encounter for antineoplastic immunotherapy: Secondary | ICD-10-CM | POA: Diagnosis not present

## 2023-03-18 DIAGNOSIS — C7951 Secondary malignant neoplasm of bone: Secondary | ICD-10-CM

## 2023-03-18 DIAGNOSIS — R7303 Prediabetes: Secondary | ICD-10-CM

## 2023-03-18 DIAGNOSIS — Z853 Personal history of malignant neoplasm of breast: Secondary | ICD-10-CM | POA: Insufficient documentation

## 2023-03-18 DIAGNOSIS — R4182 Altered mental status, unspecified: Secondary | ICD-10-CM

## 2023-03-18 DIAGNOSIS — I6389 Other cerebral infarction: Secondary | ICD-10-CM

## 2023-03-18 DIAGNOSIS — K5730 Diverticulosis of large intestine without perforation or abscess without bleeding: Secondary | ICD-10-CM

## 2023-03-18 DIAGNOSIS — J90 Pleural effusion, not elsewhere classified: Secondary | ICD-10-CM

## 2023-03-18 DIAGNOSIS — I5033 Acute on chronic diastolic (congestive) heart failure: Secondary | ICD-10-CM

## 2023-03-18 DIAGNOSIS — I10 Essential (primary) hypertension: Secondary | ICD-10-CM

## 2023-03-18 DIAGNOSIS — G40909 Epilepsy, unspecified, not intractable, without status epilepticus: Secondary | ICD-10-CM

## 2023-03-18 DIAGNOSIS — N1831 Chronic kidney disease, stage 3a: Secondary | ICD-10-CM

## 2023-03-18 DIAGNOSIS — K222 Esophageal obstruction: Secondary | ICD-10-CM

## 2023-03-18 DIAGNOSIS — H401111 Primary open-angle glaucoma, right eye, mild stage: Secondary | ICD-10-CM

## 2023-03-18 DIAGNOSIS — M898X5 Other specified disorders of bone, thigh: Secondary | ICD-10-CM

## 2023-03-18 DIAGNOSIS — I351 Nonrheumatic aortic (valve) insufficiency: Secondary | ICD-10-CM

## 2023-03-18 DIAGNOSIS — D72829 Elevated white blood cell count, unspecified: Secondary | ICD-10-CM

## 2023-03-18 DIAGNOSIS — J385 Laryngeal spasm: Secondary | ICD-10-CM

## 2023-03-18 DIAGNOSIS — H401122 Primary open-angle glaucoma, left eye, moderate stage: Secondary | ICD-10-CM

## 2023-03-18 DIAGNOSIS — I517 Cardiomegaly: Secondary | ICD-10-CM

## 2023-03-18 DIAGNOSIS — Z0000 Encounter for general adult medical examination without abnormal findings: Secondary | ICD-10-CM

## 2023-03-18 DIAGNOSIS — M810 Age-related osteoporosis without current pathological fracture: Secondary | ICD-10-CM

## 2023-03-18 LAB — CMP (CANCER CENTER ONLY)
ALT: 20 U/L (ref 0–44)
AST: 24 U/L (ref 15–41)
Albumin: 3.7 g/dL (ref 3.5–5.0)
Alkaline Phosphatase: 61 U/L (ref 38–126)
Anion gap: 8 (ref 5–15)
BUN: 22 mg/dL (ref 8–23)
CO2: 27 mmol/L (ref 22–32)
Calcium: 8.8 mg/dL — ABNORMAL LOW (ref 8.9–10.3)
Chloride: 105 mmol/L (ref 98–111)
Creatinine: 0.79 mg/dL (ref 0.44–1.00)
GFR, Estimated: >60 mL/min (ref >60)
Glucose, Bld: 155 mg/dL — ABNORMAL HIGH (ref 70–99)
Potassium: 3.4 mmol/L — ABNORMAL LOW (ref 3.5–5.1)
Sodium: 140 mmol/L (ref 135–145)
Total Bilirubin: 0.6 mg/dL (ref 0.0–1.2)
Total Protein: 6.2 g/dL — ABNORMAL LOW (ref 6.5–8.1)

## 2023-03-18 LAB — CBC WITH DIFFERENTIAL (CANCER CENTER ONLY)
Abs Immature Granulocytes: 0.06 10*3/uL (ref 0.00–0.07)
Basophils Absolute: 0 10*3/uL (ref 0.0–0.1)
Basophils Relative: 1 %
Eosinophils Absolute: 0 10*3/uL (ref 0.0–0.5)
Eosinophils Relative: 0 %
HCT: 36.2 % (ref 36.0–46.0)
Hemoglobin: 11.9 g/dL — ABNORMAL LOW (ref 12.0–15.0)
Immature Granulocytes: 1 %
Lymphocytes Relative: 16 %
Lymphs Abs: 0.8 10*3/uL (ref 0.7–4.0)
MCH: 32.5 pg (ref 26.0–34.0)
MCHC: 32.9 g/dL (ref 30.0–36.0)
MCV: 98.9 fL (ref 80.0–100.0)
Monocytes Absolute: 0.7 10*3/uL (ref 0.1–1.0)
Monocytes Relative: 13 %
Neutro Abs: 3.5 10*3/uL (ref 1.7–7.7)
Neutrophils Relative %: 69 %
Platelet Count: 206 10*3/uL (ref 150–400)
RBC: 3.66 MIL/uL — ABNORMAL LOW (ref 3.87–5.11)
RDW: 16 % — ABNORMAL HIGH (ref 11.5–15.5)
WBC Count: 5.1 10*3/uL (ref 4.0–10.5)
nRBC: 0 % (ref 0.0–0.2)

## 2023-03-18 MED ORDER — SODIUM CHLORIDE 0.9 % IV SOLN
150.0000 mg | Freq: Once | INTRAVENOUS | Status: AC
  Administered 2023-03-18: 150 mg via INTRAVENOUS
  Filled 2023-03-18: qty 150

## 2023-03-18 MED ORDER — FAM-TRASTUZUMAB DERUXTECAN-NXKI CHEMO 100 MG IV SOLR
300.0000 mg | Freq: Once | INTRAVENOUS | Status: AC
  Administered 2023-03-18: 300 mg via INTRAVENOUS
  Filled 2023-03-18: qty 15

## 2023-03-18 MED ORDER — DIPHENHYDRAMINE HCL 25 MG PO CAPS
25.0000 mg | ORAL_CAPSULE | Freq: Once | ORAL | Status: AC
  Administered 2023-03-18: 25 mg via ORAL
  Filled 2023-03-18: qty 1

## 2023-03-18 MED ORDER — SODIUM CHLORIDE 0.9% FLUSH
10.0000 mL | INTRAVENOUS | Status: DC | PRN
  Administered 2023-03-18: 10 mL

## 2023-03-18 MED ORDER — DEXTROSE 5 % IV SOLN: Freq: Once | INTRAVENOUS | Status: AC

## 2023-03-18 MED ORDER — HEPARIN SOD (PORK) LOCK FLUSH 100 UNIT/ML IV SOLN
500.0000 [IU] | Freq: Once | INTRAVENOUS | Status: AC | PRN
  Administered 2023-03-18: 500 [IU]

## 2023-03-18 MED ORDER — ACETAMINOPHEN 325 MG PO TABS
650.0000 mg | ORAL_TABLET | Freq: Once | ORAL | Status: AC
  Administered 2023-03-18: 650 mg via ORAL
  Filled 2023-03-18: qty 2

## 2023-03-18 MED ORDER — DEXAMETHASONE SODIUM PHOSPHATE 10 MG/ML IJ SOLN
10.0000 mg | Freq: Once | INTRAMUSCULAR | Status: AC
  Administered 2023-03-18: 10 mg via INTRAVENOUS
  Filled 2023-03-18: qty 1

## 2023-03-18 MED ORDER — PALONOSETRON HCL INJECTION 0.25 MG/5ML
0.2500 mg | Freq: Once | INTRAVENOUS | Status: AC
  Administered 2023-03-18: 0.25 mg via INTRAVENOUS
  Filled 2023-03-18: qty 5

## 2023-03-18 NOTE — Progress Notes (Signed)
No Xgeva today per Maralyn Sago.  Anola Gurney Lutz, [place], BCPS, BCOP 03/18/2023 2:33 PM

## 2023-03-18 NOTE — Patient Instructions (Signed)
 [facility] - A DEPT OF [facility]  Discharge Instructions: Thank you for choosing [facility] to provide your oncology and hematology care.   If you have a lab appointment with the Cancer Center, please go directly to the Cancer Center and check in at the registration area.  Wear comfortable clothing and clothing appropriate for easy access to any Portacath or PICC line.   We strive to give you quality time with your provider. You may need to reschedule your appointment if you arrive late (15 or more minutes).  Arriving late affects you and other patients whose appointments are after yours.  Also, if you miss three or more appointments without notifying the office, you may be dismissed from the clinic at the provider's discretion.      For prescription refill requests, have your pharmacy contact our office and allow 72 hours for refills to be completed.    Today you received the following chemotherapy and/or immunotherapy agents Enhertu      To help prevent nausea and vomiting after your treatment, we encourage you to take your nausea medication as directed.  BELOW ARE SYMPTOMS THAT SHOULD BE REPORTED IMMEDIATELY: *FEVER GREATER THAN 100.4 F (38 C) OR HIGHER *CHILLS OR SWEATING *NAUSEA AND VOMITING THAT IS NOT CONTROLLED WITH YOUR NAUSEA MEDICATION *UNUSUAL SHORTNESS OF BREATH *UNUSUAL BRUISING OR BLEEDING *URINARY PROBLEMS (pain or burning when urinating, or frequent urination) *BOWEL PROBLEMS (unusual diarrhea, constipation, pain near the anus) TENDERNESS IN MOUTH AND THROAT WITH OR WITHOUT PRESENCE OF ULCERS (sore throat, sores in mouth, or a toothache) UNUSUAL RASH, SWELLING OR PAIN  UNUSUAL VAGINAL DISCHARGE OR ITCHING   Items with * indicate a potential emergency and should be followed up as soon as possible or go to the Emergency Department if any problems should occur.  Please show the CHEMOTHERAPY ALERT CARD or IMMUNOTHERAPY  ALERT CARD at check-in to the Emergency Department and triage nurse. Should you have questions after your visit or need to cancel or reschedule your appointment, please contact [facility] - A DEPT OF [facility]  [phone] and follow the prompts.  Office hours are 8:00 a.m. to 4:30 p.m. Monday - Friday. Please note that voicemails left after 4:00 p.m. may not be returned until the following business day.  We are closed weekends and major holidays. You have access to a nurse at all times for urgent questions. Please call the main number to the clinic [phone] and follow the prompts.  For any non-urgent questions, you may also contact your provider using MyChart. We now offer e-Visits for anyone 48 and older to request care online for non-urgent symptoms. For details visit [email or web address].   Also download the MyChart app! Go to the app store, search "MyChart", open the app, select , and log in with your MyChart username and password.

## 2023-03-18 NOTE — Progress Notes (Signed)
Hematology and Oncology Follow Up Visit  Patricia [name] [id number] March 21, 1940 83 y.o. 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 Q4-6 months      Interim History:  Patricia [name] is back for follow-up.  02/17/2023 PET scan did not show any evidence of active disease.  The only thing that may have troubled may was there is still the persistent moderate size left pleural effusion.  She has had this drained in the past however she does not report any SOB prior, current and did not notice any difference with having this area drained.   She is not complaining of any shortness of breath or increase of her chronic cough- this has been stable for many years.  There is no chest wall pain.  There is no heaviness. No hemoptysis or productivity of sputum.   She has had no change in bowel or bladder habits.  She gets around okay.  She does use a cane for support. Fortunately no recent falls.   Her last CA 27.29 back in Dec was 19.7  She is eating okay.  She is having no problems with nausea or vomiting.  She is having no bleeding.  There is no leg swelling.  She has had no issues with fever.  She has had no palpitations.  She is on Eliquis.  There has been no bleeding to her knowledge: denies epistaxis, gingivitis, hemoptysis, hematemesis, hematuria, melena, excessive bruising, blood donation.    Overall, I would have said that her performance status is probably ECOG 1.     Wt Readings from Last 3 Encounters:  03/18/23 134 lb (60.8 kg)  03/11/23 153 lb (69.4 kg)  02/25/23 135 lb (61.2 kg)    Medications:  Current Outpatient Medications:    albuterol (PROVENTIL HFA;VENTOLIN HFA) 108 (90 Base) MCG/ACT inhaler, Inhale 2 puffs into the lungs every 6 (six) hours as needed for wheezing or shortness of breath., Disp: , Rfl:    apixaban (ELIQUIS) 5 MG TABS tablet, Take 1 tablet (5 mg total) by mouth 2 (two) times daily., Disp: 180 tablet, Rfl: 3   Apoaequorin (PREVAGEN) 10 MG CAPS, Take 1 capsule by mouth every morning., Disp: 90 capsule, Rfl: 3   atorvastatin (LIPITOR) 40 MG tablet, TAKE 1 TABLET DAILY BEFORE SUPPER, Disp: 90 tablet, Rfl: 1   bimatoprost (LUMIGAN) 0.01 % SOLN, Place 1 drop into both eyes 2 (two) times daily., Disp: , Rfl:    calcium-vitamin D (OSCAL WITH D) 500-200 MG-UNIT tablet, Take 1 tablet by mouth daily with breakfast., Disp: 30 tablet, Rfl: 0   dexamethasone (DECADRON) 4 MG tablet, Take 2 tablets (8 mg) by mouth daily for 3 days starting the day after chemotherapy. Take with food., Disp: 30 tablet, Rfl: 1   digoxin (LANOXIN) 0.125 MG tablet, Take 1 tablet (0.125 mg total) by mouth daily., Disp: 90 tablet, Rfl: 3   donepezil (ARICEPT) 5 MG tablet, Take 1 tablet (5 mg total) by mouth at bedtime., Disp: 30 tablet, Rfl: 11   dorzolamide-timolol (COSOPT) 22.3-6.8 MG/ML ophthalmic solution, Place 1 drop into both eyes at bedtime., Disp: , Rfl:    fluticasone (FLONASE) 50 MCG/ACT nasal  spray, 1 spray each nostril following sinus rinses twice daily, Disp: 16 g, Rfl: 2   fluticasone-salmeterol (ADVAIR HFA) 230-21 MCG/ACT inhaler, USE 2 INHALATIONS TWICE A DAY, Disp: 36 g, Rfl: 3   folic acid (FOLVITE) 400 MCG tablet, Take 400 mcg by mouth daily., Disp: , Rfl:    furosemide (LASIX) 20 MG tablet, Take 1 tablet (20 mg total) by mouth 2 (two) times daily. Take additional 20 mg as needed for swelling, Disp: 180 tablet, Rfl: 2   gabapentin (NEURONTIN) 400 MG capsule, TAKE 1 CAPSULE TWICE A DAY, Disp: 180 capsule, Rfl: 3   L-Methylfolate-B12-B6-B2 (CEREFOLIN) 07-31-48-5 MG TABS, Take  1 tablet by mouth every morning., Disp: 90 tablet, Rfl: 3   levETIRAcetam (KEPPRA XR) 500 MG 24 hr tablet, TAKE 1 TABLET AT BEDTIME, Disp: 90 tablet, Rfl: 3   lidocaine-prilocaine (EMLA) cream, Apply a dime size of cream to port-a-cath 1-2 hours prior to access. Cover with Warren Lacy., Disp: 30 g, Rfl: 3   loratadine (CLARITIN) 10 MG tablet, Take 10 mg by mouth daily., Disp: , Rfl:    memantine (NAMENDA) 10 MG tablet, TAKE 1 TABLET TWICE A DAY, Disp: 180 tablet, Rfl: 3   metoprolol tartrate (LOPRESSOR) 50 MG tablet, Take 1 tablet (50 mg total) by mouth 2 (two) times daily., Disp: 180 tablet, Rfl: 3   Multiple Vitamins-Iron (MULTIVITAMIN/IRON) TABS, Take 1 tablet by mouth daily., Disp: , Rfl:    Netarsudil Dimesylate (RHOPRESSA) 0.02 % SOLN, Place 1 drop into both eyes at bedtime., Disp: , Rfl:    OVER THE COUNTER MEDICATION, Take 1 tablet by mouth daily. Zyflamend otc herbal supplement, Disp: , Rfl:    pantoprazole (PROTONIX) 40 MG tablet, TAKE 1 TABLET DAILY, Disp: 90 tablet, Rfl: 3   potassium chloride SA (KLOR-CON M) 20 MEQ tablet, Take 1 tablet (20 mEq total) by mouth daily., Disp: 90 tablet, Rfl: 3   thyroid (ARMOUR THYROID) 15 MG tablet, Take 1 tablet (15 mg total) by mouth daily. Take with 60mg  for a total dose of 75mg  daily., Disp: 90 tablet, Rfl: 1   thyroid (ARMOUR THYROID) 60 MG tablet, Take 1 tablet (60 mg total) by mouth daily. Takes with 15mg  daily., Disp: 90 tablet, Rfl: 1   traMADol (ULTRAM) 50 MG tablet, Take 1 tablet (50 mg total) by mouth every 6 (six) hours as needed (post op pain)., Disp: 60 tablet, Rfl: 0   Cholecalciferol (VITAMIN D3) 50 MCG (2000 UT) capsule, Take 2,000 Units by mouth daily. (Patient not taking: Reported on 02/04/2023), Disp: , Rfl:    ondansetron (ZOFRAN) 8 MG tablet, Take 1 tablet (8 mg total) by mouth every 8 (eight) hours as needed for nausea or vomiting. Start on the third day after chemotherapy. (Patient not taking: Reported on 03/18/2023), Disp: 30 tablet,  Rfl: 1   prochlorperazine (COMPAZINE) 10 MG tablet, Take 1 tablet (10 mg total) by mouth every 6 (six) hours as needed for nausea or vomiting. (Patient not taking: Reported on 03/18/2023), Disp: 30 tablet, Rfl: 1 No current facility-administered medications for this visit.  Facility-Administered Medications Ordered in Other Visits:    acetaminophen (TYLENOL) tablet 650 mg, 650 mg, Oral, Once, Ennever, Rose Phi, MD   dexamethasone (DECADRON) 10 mg in sodium chloride 0.9 % 50 mL IVPB, 10 mg, Intravenous, Once, Ennever, Rose Phi, MD   dextrose 5 % solution, , Intravenous, Once, Ennever, Rose Phi, MD   diphenhydrAMINE (BENADRYL) capsule 50 mg, 50 mg, Oral, Once, Ennever, Rose Phi, MD  fosaprepitant (EMEND) 150 mg in sodium chloride 0.9 % 145 mL IVPB, 150 mg, Intravenous, Once, Ennever, Rose Phi, MD   palonosetron (ALOXI) injection 0.25 mg, 0.25 mg, Intravenous, Once, Ennever, Rose Phi, MD  Allergies:  Allergies  Allergen Reactions   Combigan [Brimonidine Tartrate-Timolol] Itching    Per patient made eyes red, sore, and sensitivity to light   Sulfa Antibiotics Hives and Rash   Sulfamethoxazole-Trimethoprim Hives    Past Medical History, Surgical history, Social history, and Family History were reviewed and updated.  Review of Systems: Review of Systems  Constitutional:  Negative for appetite change and fatigue.  HENT:  Negative.    Eyes: Negative.   Respiratory:  Positive for cough (chronic unchanged). Negative for chest tightness and shortness of breath (chronic and stable).   Cardiovascular: Negative.   Gastrointestinal: Negative.   Endocrine: Negative.   Genitourinary: Negative.    Musculoskeletal:  Negative for gait problem.  Skin: Negative.   Neurological:  Negative for dizziness, gait problem, headaches and light-headedness.  Hematological: Negative.   Psychiatric/Behavioral: Negative.      Physical Exam:  height is 5\' 1"  (1.549 m) and weight is 134 lb (60.8 kg). Her oral  temperature is 98 F (36.7 C). Her blood pressure is 136/73 and her pulse is 73. Her respiration is 20 and oxygen saturation is 95%.   Wt Readings from Last 3 Encounters:  03/18/23 134 lb (60.8 kg)  03/11/23 153 lb (69.4 kg)  02/25/23 135 lb (61.2 kg)    Physical Exam Vitals reviewed.  HENT:     Head: Normocephalic and atraumatic.  Eyes:     Pupils: Pupils are equal, round, and reactive to light.  Cardiovascular:     Rate and Rhythm: Normal rate and regular rhythm.     Heart sounds: Normal heart sounds.  Pulmonary:     Effort: Pulmonary effort is normal.     Breath sounds: Normal breath sounds.  Abdominal:     General: Bowel sounds are normal.     Palpations: Abdomen is soft.  Musculoskeletal:        General: No tenderness or deformity. Normal range of motion.     Cervical back: Normal range of motion.     Left lower leg: No edema.  Lymphadenopathy:     Cervical: No cervical adenopathy.  Skin:    General: Skin is warm and dry.     Findings: No erythema or rash.  Neurological:     Mental Status: She is alert and oriented to person, place, and time.  Psychiatric:        Behavior: Behavior normal.        Thought Content: Thought content normal.        Judgment: Judgment normal.     Lab Results  Component Value Date   WBC 5.1 03/18/2023   HGB 11.9 (L) 03/18/2023   HCT 36.2 03/18/2023   MCV 98.9 03/18/2023   PLT 206 03/18/2023     Chemistry      Component Value Date/Time   NA 140 02/25/2023 1230   NA 142 12/24/2022 0925   NA 141 09/22/2016 1459   K 4.2 02/25/2023 1230   K 3.8 09/22/2016 1459   CL 105 02/25/2023 1230   CO2 28 02/25/2023 1230   CO2 26 09/22/2016 1459   BUN 21 02/25/2023 1230   BUN 35 (H) 12/24/2022 0925   BUN 19.8 09/22/2016 1459   CREATININE 0.82 02/25/2023 1230   CREATININE 1.1 09/22/2016 1459  Component Value Date/Time   CALCIUM 9.0 02/25/2023 1230   CALCIUM 9.2 09/22/2016 1459   ALKPHOS 68 02/25/2023 1230   ALKPHOS 98 09/22/2016  1459   AST 22 02/25/2023 1230   AST 15 09/22/2016 1459   ALT 21 02/25/2023 1230   ALT 21 09/22/2016 1459   BILITOT 0.5 02/25/2023 1230   BILITOT 0.65 09/22/2016 1459     Encounter Diagnoses  Name Primary?   Encounter for antineoplastic chemotherapy Yes   Chronic diastolic heart failure (HCC)    Impression and Plan: Patricia [name] is a very nice 83 year old postmenopausal white female.  She has a solitary metastatic focus from breast cancer.  Her initial breast cancer was probably 7 years ago.  It was estrogen positive. 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  The HER2 was equivocal on initial staining.  She continues to do well on Enhertu without evidence of toxicity. She is due for a repeat ECHO which I have ordered today. Again reviewed red flags of toxicities. She will also continue follow up with her cardiologist.   RTC 3 weeks MD, labs (CBC w/, CMP, CA 27.29, L[facility]), Enhertu   Brand Males [place], [place] 1/16/202512:03 PM

## 2023-03-19 LAB — CANCER ANTIGEN 27.29: CA 27.29: 21.5 U/mL (ref 0.0–38.6)

## 2023-03-26 ENCOUNTER — Ambulatory Visit (HOSPITAL_COMMUNITY)
Admission: RE | Admit: 2023-03-26 | Discharge: 2023-03-26 | Disposition: A | Discharge status: Home / Self Care | Payer: Medicare Other | Source: Ambulatory Visit | Attending: Medical Oncology | Admitting: Medical Oncology

## 2023-03-26 DIAGNOSIS — I272 Pulmonary hypertension, unspecified: Secondary | ICD-10-CM | POA: Diagnosis not present

## 2023-03-26 DIAGNOSIS — Z79899 Other long term (current) drug therapy: Secondary | ICD-10-CM | POA: Insufficient documentation

## 2023-03-26 DIAGNOSIS — I4891 Unspecified atrial fibrillation: Secondary | ICD-10-CM | POA: Insufficient documentation

## 2023-03-26 DIAGNOSIS — I5032 Chronic diastolic (congestive) heart failure: Secondary | ICD-10-CM | POA: Insufficient documentation

## 2023-03-26 DIAGNOSIS — I110 Hypertensive heart disease with heart failure: Secondary | ICD-10-CM | POA: Diagnosis not present

## 2023-03-26 DIAGNOSIS — Z5111 Encounter for antineoplastic chemotherapy: Secondary | ICD-10-CM | POA: Diagnosis not present

## 2023-03-26 DIAGNOSIS — I083 Combined rheumatic disorders of mitral, aortic and tricuspid valves: Secondary | ICD-10-CM | POA: Insufficient documentation

## 2023-03-26 DIAGNOSIS — E785 Hyperlipidemia, unspecified: Secondary | ICD-10-CM | POA: Diagnosis not present

## 2023-03-26 LAB — ECHOCARDIOGRAM COMPLETE
MV M vel: 4.56 m/s
MV Peak grad: 83.2 mm[Hg]
MV VTI: 1.07 cm2
P 1/2 time: 460 ms
Radius: 0.7 cm
S' Lateral: 2.3 cm

## 2023-03-29 ENCOUNTER — Encounter: Payer: Self-pay | Admitting: *Deleted

## 2023-04-05 DIAGNOSIS — M79605 Pain in left leg: Secondary | ICD-10-CM | POA: Diagnosis not present

## 2023-04-08 ENCOUNTER — Inpatient Hospital Stay: Payer: Medicare Other | Admitting: Hematology & Oncology

## 2023-04-08 ENCOUNTER — Inpatient Hospital Stay: Payer: Medicare Other

## 2023-04-08 ENCOUNTER — Encounter: Payer: Self-pay | Admitting: Family Medicine

## 2023-04-08 ENCOUNTER — Encounter: Payer: Self-pay | Admitting: Hematology & Oncology

## 2023-04-08 ENCOUNTER — Other Ambulatory Visit: Payer: Self-pay | Admitting: Oncology

## 2023-04-08 ENCOUNTER — Inpatient Hospital Stay: Payer: Medicare Other | Attending: Hematology & Oncology

## 2023-04-08 VITALS — BP 99/54 | HR 79 | Temp 98.0°F | Resp 19 | Ht 61.0 in | Wt 132.8 lb

## 2023-04-08 DIAGNOSIS — R8271 Bacteriuria: Secondary | ICD-10-CM

## 2023-04-08 DIAGNOSIS — I4891 Unspecified atrial fibrillation: Secondary | ICD-10-CM

## 2023-04-08 DIAGNOSIS — C7951 Secondary malignant neoplasm of bone: Secondary | ICD-10-CM | POA: Diagnosis not present

## 2023-04-08 DIAGNOSIS — K219 Gastro-esophageal reflux disease without esophagitis: Secondary | ICD-10-CM

## 2023-04-08 DIAGNOSIS — Z171 Estrogen receptor negative status [ER-]: Secondary | ICD-10-CM | POA: Diagnosis not present

## 2023-04-08 DIAGNOSIS — I340 Nonrheumatic mitral (valve) insufficiency: Secondary | ICD-10-CM

## 2023-04-08 DIAGNOSIS — Z85118 Personal history of other malignant neoplasm of bronchus and lung: Secondary | ICD-10-CM

## 2023-04-08 DIAGNOSIS — C50919 Malignant neoplasm of unspecified site of unspecified female breast: Secondary | ICD-10-CM | POA: Diagnosis not present

## 2023-04-08 DIAGNOSIS — I5032 Chronic diastolic (congestive) heart failure: Secondary | ICD-10-CM

## 2023-04-08 DIAGNOSIS — C3410 Malignant neoplasm of upper lobe, unspecified bronchus or lung: Secondary | ICD-10-CM

## 2023-04-08 DIAGNOSIS — Z5111 Encounter for antineoplastic chemotherapy: Secondary | ICD-10-CM

## 2023-04-08 DIAGNOSIS — N951 Menopausal and female climacteric states: Secondary | ICD-10-CM

## 2023-04-08 DIAGNOSIS — Z170 Estrogen receptor positive status [ER+]: Secondary | ICD-10-CM | POA: Diagnosis not present

## 2023-04-08 DIAGNOSIS — I6389 Other cerebral infarction: Secondary | ICD-10-CM

## 2023-04-08 DIAGNOSIS — M1990 Unspecified osteoarthritis, unspecified site: Secondary | ICD-10-CM

## 2023-04-08 DIAGNOSIS — J810 Acute pulmonary edema: Secondary | ICD-10-CM

## 2023-04-08 DIAGNOSIS — Z5112 Encounter for antineoplastic immunotherapy: Secondary | ICD-10-CM | POA: Insufficient documentation

## 2023-04-08 DIAGNOSIS — H401111 Primary open-angle glaucoma, right eye, mild stage: Secondary | ICD-10-CM

## 2023-04-08 DIAGNOSIS — H401122 Primary open-angle glaucoma, left eye, moderate stage: Secondary | ICD-10-CM

## 2023-04-08 DIAGNOSIS — R7303 Prediabetes: Secondary | ICD-10-CM

## 2023-04-08 DIAGNOSIS — Z79899 Other long term (current) drug therapy: Secondary | ICD-10-CM

## 2023-04-08 DIAGNOSIS — M898X5 Other specified disorders of bone, thigh: Secondary | ICD-10-CM

## 2023-04-08 DIAGNOSIS — I517 Cardiomegaly: Secondary | ICD-10-CM

## 2023-04-08 DIAGNOSIS — C50412 Malignant neoplasm of upper-outer quadrant of left female breast: Secondary | ICD-10-CM

## 2023-04-08 DIAGNOSIS — D72829 Elevated white blood cell count, unspecified: Secondary | ICD-10-CM

## 2023-04-08 DIAGNOSIS — R942 Abnormal results of pulmonary function studies: Secondary | ICD-10-CM

## 2023-04-08 DIAGNOSIS — I639 Cerebral infarction, unspecified: Secondary | ICD-10-CM

## 2023-04-08 DIAGNOSIS — I272 Pulmonary hypertension, unspecified: Secondary | ICD-10-CM

## 2023-04-08 DIAGNOSIS — N1831 Chronic kidney disease, stage 3a: Secondary | ICD-10-CM

## 2023-04-08 DIAGNOSIS — E559 Vitamin D deficiency, unspecified: Secondary | ICD-10-CM

## 2023-04-08 DIAGNOSIS — R053 Chronic cough: Secondary | ICD-10-CM

## 2023-04-08 DIAGNOSIS — Z8673 Personal history of transient ischemic attack (TIA), and cerebral infarction without residual deficits: Secondary | ICD-10-CM

## 2023-04-08 DIAGNOSIS — Z0000 Encounter for general adult medical examination without abnormal findings: Secondary | ICD-10-CM

## 2023-04-08 DIAGNOSIS — E032 Hypothyroidism due to medicaments and other exogenous substances: Secondary | ICD-10-CM

## 2023-04-08 DIAGNOSIS — J385 Laryngeal spasm: Secondary | ICD-10-CM

## 2023-04-08 DIAGNOSIS — R4182 Altered mental status, unspecified: Secondary | ICD-10-CM

## 2023-04-08 DIAGNOSIS — J45991 Cough variant asthma: Secondary | ICD-10-CM

## 2023-04-08 DIAGNOSIS — I4819 Other persistent atrial fibrillation: Secondary | ICD-10-CM

## 2023-04-08 DIAGNOSIS — J90 Pleural effusion, not elsewhere classified: Secondary | ICD-10-CM

## 2023-04-08 DIAGNOSIS — Z124 Encounter for screening for malignant neoplasm of cervix: Secondary | ICD-10-CM

## 2023-04-08 DIAGNOSIS — K5730 Diverticulosis of large intestine without perforation or abscess without bleeding: Secondary | ICD-10-CM

## 2023-04-08 DIAGNOSIS — E042 Nontoxic multinodular goiter: Secondary | ICD-10-CM

## 2023-04-08 DIAGNOSIS — I351 Nonrheumatic aortic (valve) insufficiency: Secondary | ICD-10-CM

## 2023-04-08 DIAGNOSIS — G40909 Epilepsy, unspecified, not intractable, without status epilepticus: Secondary | ICD-10-CM

## 2023-04-08 DIAGNOSIS — I5033 Acute on chronic diastolic (congestive) heart failure: Secondary | ICD-10-CM

## 2023-04-08 DIAGNOSIS — M810 Age-related osteoporosis without current pathological fracture: Secondary | ICD-10-CM

## 2023-04-08 DIAGNOSIS — I10 Essential (primary) hypertension: Secondary | ICD-10-CM

## 2023-04-08 DIAGNOSIS — R413 Other amnesia: Secondary | ICD-10-CM

## 2023-04-08 DIAGNOSIS — K222 Esophageal obstruction: Secondary | ICD-10-CM

## 2023-04-08 DIAGNOSIS — M8580 Other specified disorders of bone density and structure, unspecified site: Secondary | ICD-10-CM

## 2023-04-08 LAB — CBC WITH DIFFERENTIAL (CANCER CENTER ONLY)
Abs Immature Granulocytes: 0.09 10*3/uL — ABNORMAL HIGH (ref 0.00–0.07)
Basophils Absolute: 0 10*3/uL (ref 0.0–0.1)
Basophils Relative: 1 %
Eosinophils Absolute: 0 10*3/uL (ref 0.0–0.5)
Eosinophils Relative: 0 %
HCT: 36.4 % (ref 36.0–46.0)
Hemoglobin: 11.9 g/dL — ABNORMAL LOW (ref 12.0–15.0)
Immature Granulocytes: 2 %
Lymphocytes Relative: 15 %
Lymphs Abs: 0.9 10*3/uL (ref 0.7–4.0)
MCH: 32.6 pg (ref 26.0–34.0)
MCHC: 32.7 g/dL (ref 30.0–36.0)
MCV: 99.7 fL (ref 80.0–100.0)
Monocytes Absolute: 0.9 10*3/uL (ref 0.1–1.0)
Monocytes Relative: 17 %
Neutro Abs: 3.6 10*3/uL (ref 1.7–7.7)
Neutrophils Relative %: 65 %
Platelet Count: 211 10*3/uL (ref 150–400)
RBC: 3.65 MIL/uL — ABNORMAL LOW (ref 3.87–5.11)
RDW: 17.7 % — ABNORMAL HIGH (ref 11.5–15.5)
WBC Count: 5.5 10*3/uL (ref 4.0–10.5)
nRBC: 0 % (ref 0.0–0.2)

## 2023-04-08 LAB — CMP (CANCER CENTER ONLY)
ALT: 28 U/L (ref 0–44)
AST: 28 U/L (ref 15–41)
Albumin: 3.6 g/dL (ref 3.5–5.0)
Alkaline Phosphatase: 64 U/L (ref 38–126)
Anion gap: 9 (ref 5–15)
BUN: 19 mg/dL (ref 8–23)
CO2: 28 mmol/L (ref 22–32)
Calcium: 9.1 mg/dL (ref 8.9–10.3)
Chloride: 105 mmol/L (ref 98–111)
Creatinine: 0.86 mg/dL (ref 0.44–1.00)
GFR, Estimated: >60 mL/min (ref >60)
Glucose, Bld: 112 mg/dL — ABNORMAL HIGH (ref 70–99)
Potassium: 3.7 mmol/L (ref 3.5–5.1)
Sodium: 142 mmol/L (ref 135–145)
Total Bilirubin: 0.5 mg/dL (ref 0.0–1.2)
Total Protein: 6.1 g/dL — ABNORMAL LOW (ref 6.5–8.1)

## 2023-04-08 LAB — LACTATE DEHYDROGENASE: LDH: 281 U/L — ABNORMAL HIGH (ref 98–192)

## 2023-04-08 MED ORDER — ACETAMINOPHEN 325 MG PO TABS
650.0000 mg | ORAL_TABLET | Freq: Once | ORAL | Status: AC
  Administered 2023-04-08: 650 mg via ORAL
  Filled 2023-04-08: qty 2

## 2023-04-08 MED ORDER — SODIUM CHLORIDE 0.9 % IV SOLN
150.0000 mg | Freq: Once | INTRAVENOUS | Status: AC
  Administered 2023-04-08: 150 mg via INTRAVENOUS
  Filled 2023-04-08: qty 150

## 2023-04-08 MED ORDER — FAM-TRASTUZUMAB DERUXTECAN-NXKI CHEMO 100 MG IV SOLR
300.0000 mg | Freq: Once | INTRAVENOUS | Status: AC
  Administered 2023-04-08: 300 mg via INTRAVENOUS
  Filled 2023-04-08: qty 15

## 2023-04-08 MED ORDER — DEXAMETHASONE SODIUM PHOSPHATE 10 MG/ML IJ SOLN
10.0000 mg | Freq: Once | INTRAMUSCULAR | Status: AC
  Administered 2023-04-08: 10 mg via INTRAVENOUS
  Filled 2023-04-08: qty 1

## 2023-04-08 MED ORDER — DEXTROSE 5 % IV SOLN: Freq: Once | INTRAVENOUS | Status: AC

## 2023-04-08 MED ORDER — PALONOSETRON HCL INJECTION 0.25 MG/5ML
0.2500 mg | Freq: Once | INTRAVENOUS | Status: AC
  Administered 2023-04-08: 0.25 mg via INTRAVENOUS
  Filled 2023-04-08: qty 5

## 2023-04-08 MED ORDER — SODIUM CHLORIDE 0.9% FLUSH
10.0000 mL | INTRAVENOUS | Status: DC | PRN
  Administered 2023-04-08: 10 mL

## 2023-04-08 MED ORDER — HEPARIN SOD (PORK) LOCK FLUSH 100 UNIT/ML IV SOLN
500.0000 [IU] | Freq: Once | INTRAVENOUS | Status: AC | PRN
  Administered 2023-04-08: 500 [IU]

## 2023-04-08 MED ORDER — DIPHENHYDRAMINE HCL 25 MG PO CAPS
25.0000 mg | ORAL_CAPSULE | Freq: Once | ORAL | Status: AC
  Administered 2023-04-08: 25 mg via ORAL
  Filled 2023-04-08: qty 1

## 2023-04-08 NOTE — Patient Instructions (Signed)

## 2023-04-08 NOTE — Addendum Note (Signed)
 Addended by: Gray Layman R on: 04/08/2023 12:51 PM   Modules accepted: Orders

## 2023-04-08 NOTE — Patient Instructions (Signed)
 [facility] - A DEPT OF [facility]  Discharge Instructions: Thank you for choosing [facility] to provide your oncology and hematology care.   If you have a lab appointment with the Cancer Center, please go directly to the Cancer Center and check in at the registration area.  Wear comfortable clothing and clothing appropriate for easy access to any Portacath or PICC line.   We strive to give you quality time with your provider. You may need to reschedule your appointment if you arrive late (15 or more minutes).  Arriving late affects you and other patients whose appointments are after yours.  Also, if you miss three or more appointments without notifying the office, you may be dismissed from the clinic at the provider's discretion.      For prescription refill requests, have your pharmacy contact our office and allow 72 hours for refills to be completed.    Today you received the following chemotherapy and/or immunotherapy agents Enhertu      To help prevent nausea and vomiting after your treatment, we encourage you to take your nausea medication as directed.  BELOW ARE SYMPTOMS THAT SHOULD BE REPORTED IMMEDIATELY: *FEVER GREATER THAN 100.4 F (38 C) OR HIGHER *CHILLS OR SWEATING *NAUSEA AND VOMITING THAT IS NOT CONTROLLED WITH YOUR NAUSEA MEDICATION *UNUSUAL SHORTNESS OF BREATH *UNUSUAL BRUISING OR BLEEDING *URINARY PROBLEMS (pain or burning when urinating, or frequent urination) *BOWEL PROBLEMS (unusual diarrhea, constipation, pain near the anus) TENDERNESS IN MOUTH AND THROAT WITH OR WITHOUT PRESENCE OF ULCERS (sore throat, sores in mouth, or a toothache) UNUSUAL RASH, SWELLING OR PAIN  UNUSUAL VAGINAL DISCHARGE OR ITCHING   Items with * indicate a potential emergency and should be followed up as soon as possible or go to the Emergency Department if any problems should occur.  Please show the CHEMOTHERAPY ALERT CARD or IMMUNOTHERAPY  ALERT CARD at check-in to the Emergency Department and triage nurse. Should you have questions after your visit or need to cancel or reschedule your appointment, please contact [facility] - A DEPT OF [facility]  [phone] and follow the prompts.  Office hours are 8:00 a.m. to 4:30 p.m. Monday - Friday. Please note that voicemails left after 4:00 p.m. may not be returned until the following business day.  We are closed weekends and major holidays. You have access to a nurse at all times for urgent questions. Please call the main number to the clinic [phone] and follow the prompts.  For any non-urgent questions, you may also contact your provider using MyChart. We now offer e-Visits for anyone 48 and older to request care online for non-urgent symptoms. For details visit [email or web address].   Also download the MyChart app! Go to the app store, search "MyChart", open the app, select , and log in with your MyChart username and password.

## 2023-04-08 NOTE — Progress Notes (Addendum)
 Hematology and Oncology Follow Up Visit  Patricia [name] [id number] 12-11-1940 83 y.o. 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 Q4-6 months      Interim History:  Patricia [name] is back for follow-up.  Overall, I think she is doing pretty well.  She really has had no complaints..  She had echocardiogram on 03/26/2023.  This showed a excellent ejection fraction of 60 to 65%.  She is dealing with pain with the hip.  This is been chronic but very manageable.  She does have atrial fibrillation.  She is on blood thinner for this.  She denies any cough or shortness of breath.  Her last CA 27.29 was 21.5.  She has had no nausea or vomiting.  There is been no change in bowel or bladder habits.  There is been no headache.  Her appetite has been quite good.  Overall, I would say performance status is probably ECOG 1.    Wt Readings from Last 3 Encounters:  04/08/23 132 lb 12.8 oz (60.2 kg)  03/18/23 134 lb (60.8 kg)  03/11/23 153 lb (69.4 kg)   Medications:  Current Outpatient Medications:    albuterol  (PROVENTIL  HFA;VENTOLIN  HFA) 108 (90 Base) MCG/ACT inhaler, Inhale 2 puffs into the lungs every 6 (six) hours as needed for wheezing or shortness of breath., Disp: , Rfl:    apixaban  (ELIQUIS ) 5 MG TABS tablet, Take 1 tablet (5 mg total) by mouth 2 (two) times daily., Disp: 180 tablet, Rfl: 3   Apoaequorin (PREVAGEN) 10 MG CAPS, Take 1 capsule by mouth every morning., Disp: 90 capsule, Rfl: 3   atorvastatin  (LIPITOR) 40 MG tablet, TAKE 1 TABLET DAILY BEFORE SUPPER, Disp: 90  tablet, Rfl: 1   bimatoprost (LUMIGAN) 0.01 % SOLN, Place 1 drop into both eyes 2 (two) times daily., Disp: , Rfl:    calcium -vitamin D  (OSCAL WITH D) 500-200 MG-UNIT tablet, Take 1 tablet by mouth daily with breakfast., Disp: 30 tablet, Rfl: 0   Cholecalciferol  (VITAMIN D3) 50 MCG (2000 UT) capsule, Take 2,000 Units by mouth daily., Disp: , Rfl:    dexamethasone  (DECADRON ) 4 MG tablet, Take 2 tablets (8 mg) by mouth daily for 3 days starting the day after chemotherapy. Take with food., Disp: 30 tablet, Rfl: 1   digoxin  (LANOXIN ) 0.125 MG tablet, Take 1 tablet (0.125 mg total) by mouth daily., Disp: 90 tablet, Rfl: 3   donepezil  (ARICEPT ) 5 MG tablet, Take 1 tablet (5 mg total) by mouth at bedtime., Disp: 30 tablet, Rfl: 11   dorzolamide -timolol  (COSOPT ) 22.3-6.8 MG/ML ophthalmic solution, Place 1 drop into both eyes at bedtime., Disp: , Rfl:    fluticasone  (FLONASE ) 50 MCG/ACT nasal spray, 1 spray each nostril following sinus rinses twice daily, Disp: 16 g, Rfl: 2   fluticasone -salmeterol (ADVAIR  HFA) 230-21 MCG/ACT inhaler, USE 2 INHALATIONS TWICE A DAY, Disp: 36 g, Rfl: 3   folic acid  (FOLVITE ) 400 MCG tablet, Take 400 mcg by mouth daily., Disp: , Rfl:    furosemide  (LASIX ) 20 MG tablet, Take 1 tablet (20 mg total) by mouth 2 (two) times daily. Take additional 20 mg as needed for swelling,  Disp: 180 tablet, Rfl: 2   gabapentin  (NEURONTIN ) 400 MG capsule, TAKE 1 CAPSULE TWICE A DAY, Disp: 180 capsule, Rfl: 3   L-Methylfolate-B12-B6-B2 (CEREFOLIN) 07-31-48-5 MG TABS, Take 1 tablet by mouth every morning., Disp: 90 tablet, Rfl: 3   levETIRAcetam  (KEPPRA  XR) 500 MG 24 hr tablet, TAKE 1 TABLET AT BEDTIME, Disp: 90 tablet, Rfl: 3   lidocaine -prilocaine  (EMLA ) cream, Apply a dime size of cream to port-a-cath 1-2 hours prior to access. Cover with [employer]., Disp: 30 g, Rfl: 3   loratadine  (CLARITIN ) 10 MG tablet, Take 10 mg by mouth daily., Disp: , Rfl:    memantine  (NAMENDA ) 10 MG tablet, TAKE 1 TABLET  TWICE A DAY, Disp: 180 tablet, Rfl: 3   metoprolol  tartrate (LOPRESSOR ) 50 MG tablet, Take 1 tablet (50 mg total) by mouth 2 (two) times daily., Disp: 180 tablet, Rfl: 3   Multiple Vitamins-Iron  (MULTIVITAMIN/IRON ) TABS, Take 1 tablet by mouth daily., Disp: , Rfl:    Netarsudil  Dimesylate (RHOPRESSA) 0.02 % SOLN, Place 1 drop into both eyes at bedtime., Disp: , Rfl:    OVER THE COUNTER MEDICATION, Take 1 tablet by mouth daily. Zyflamend otc herbal supplement, Disp: , Rfl:    pantoprazole  (PROTONIX ) 40 MG tablet, TAKE 1 TABLET DAILY, Disp: 90 tablet, Rfl: 3   potassium chloride  SA (KLOR-CON  M) 20 MEQ tablet, Take 1 tablet (20 mEq total) by mouth daily., Disp: 90 tablet, Rfl: 3   thyroid  (ARMOUR THYROID ) 15 MG tablet, Take 1 tablet (15 mg total) by mouth daily. Take with 60mg  for a total dose of 75mg  daily., Disp: 90 tablet, Rfl: 1   thyroid  (ARMOUR THYROID ) 60 MG tablet, Take 1 tablet (60 mg total) by mouth daily. Takes with 15mg  daily., Disp: 90 tablet, Rfl: 1   traMADol  (ULTRAM ) 50 MG tablet, Take 1 tablet (50 mg total) by mouth every 6 (six) hours as needed (post op pain)., Disp: 60 tablet, Rfl: 0   ondansetron  (ZOFRAN ) 8 MG tablet, Take 1 tablet (8 mg total) by mouth every 8 (eight) hours as needed for nausea or vomiting. Start on the third day after chemotherapy. (Patient not taking: Reported on 03/18/2023), Disp: 30 tablet, Rfl: 1   prochlorperazine  (COMPAZINE ) 10 MG tablet, Take 1 tablet (10 mg total) by mouth every 6 (six) hours as needed for nausea or vomiting. (Patient not taking: Reported on 03/18/2023), Disp: 30 tablet, Rfl: 1 No current facility-administered medications for this visit.  Facility-Administered Medications Ordered in Other Visits:    acetaminophen  (TYLENOL ) tablet 650 mg, 650 mg, Oral, Once, Jshaun Abernathy, Maude SAUNDERS, MD   dexamethasone  (DECADRON ) 10 mg in sodium chloride  0.9 % 50 mL IVPB, 10 mg, Intravenous, Once, Koa Palla, Maude SAUNDERS, MD   dextrose  5 % solution, , Intravenous, Once,  Karmon Andis, Maude SAUNDERS, MD   diphenhydrAMINE  (BENADRYL ) capsule 50 mg, 50 mg, Oral, Once, Jaymason Ledesma, Maude SAUNDERS, MD   fosaprepitant  (EMEND) 150 mg in sodium chloride  0.9 % 145 mL IVPB, 150 mg, Intravenous, Once, Corie Allis, Maude SAUNDERS, MD   palonosetron  (ALOXI ) injection 0.25 mg, 0.25 mg, Intravenous, Once, Kenzli Barritt, Maude SAUNDERS, MD  Allergies:  Allergies  Allergen Reactions   Combigan [Brimonidine Tartrate-Timolol ] Itching    Per patient made eyes red, sore, and sensitivity to light   Sulfa Antibiotics Hives and Rash   Sulfamethoxazole-Trimethoprim Hives    Past Medical History, Surgical history, Social history, and Family History were reviewed and updated.  Review of Systems: Review of Systems  Constitutional:  Negative for appetite change and fatigue.  HENT:  Negative.    Eyes: Negative.   Respiratory:  Positive for cough (chronic unchanged). Negative for chest tightness and shortness of breath (chronic and stable).   Cardiovascular: Negative.   Gastrointestinal: Negative.   Endocrine: Negative.   Genitourinary: Negative.    Musculoskeletal:  Negative for gait problem.  Skin: Negative.   Neurological:  Negative for dizziness, gait problem, headaches and light-headedness.  Hematological: Negative.   Psychiatric/Behavioral: Negative.      Physical Exam:  height is 5' 1 (1.549 m) and weight is 132 lb 12.8 oz (60.2 kg). Her oral temperature is 98 F (36.7 C). Her blood pressure is 99/54 (abnormal) and her pulse is 79. Her respiration is 19 and oxygen saturation is 95%.   Wt Readings from Last 3 Encounters:  04/08/23 132 lb 12.8 oz (60.2 kg)  03/18/23 134 lb (60.8 kg)  03/11/23 153 lb (69.4 kg)    Physical Exam Vitals reviewed.  HENT:     Head: Normocephalic and atraumatic.  Eyes:     Pupils: Pupils are equal, round, and reactive to light.  Cardiovascular:     Rate and Rhythm: Normal rate and regular rhythm.     Heart sounds: Normal heart sounds.  Pulmonary:     Effort: Pulmonary effort  is normal.     Breath sounds: Normal breath sounds.  Abdominal:     General: Bowel sounds are normal.     Palpations: Abdomen is soft.  Musculoskeletal:        General: No tenderness or deformity. Normal range of motion.     Cervical back: Normal range of motion.     Left lower leg: No edema.  Lymphadenopathy:     Cervical: No cervical adenopathy.  Skin:    General: Skin is warm and dry.     Findings: No erythema or rash.  Neurological:     Mental Status: She is alert and oriented to person, place, and time.  Psychiatric:        Behavior: Behavior normal.        Thought Content: Thought content normal.        Judgment: Judgment normal.     Lab Results  Component Value Date   WBC 5.5 04/08/2023   HGB 11.9 (L) 04/08/2023   HCT 36.4 04/08/2023   MCV 99.7 04/08/2023   PLT 211 04/08/2023     Chemistry      Component Value Date/Time   NA 142 04/08/2023 1130   NA 142 12/24/2022 0925   NA 141 09/22/2016 1459   K 3.7 04/08/2023 1130   K 3.8 09/22/2016 1459   CL 105 04/08/2023 1130   CO2 28 04/08/2023 1130   CO2 26 09/22/2016 1459   BUN 19 04/08/2023 1130   BUN 35 (H) 12/24/2022 0925   BUN 19.8 09/22/2016 1459   CREATININE 0.86 04/08/2023 1130   CREATININE 1.1 09/22/2016 1459      Component Value Date/Time   CALCIUM  9.1 04/08/2023 1130   CALCIUM  9.2 09/22/2016 1459   ALKPHOS 64 04/08/2023 1130   ALKPHOS 98 09/22/2016 1459   AST 28 04/08/2023 1130   AST 15 09/22/2016 1459   ALT 28 04/08/2023 1130   ALT 21 09/22/2016 1459   BILITOT 0.5 04/08/2023 1130   BILITOT 0.65 09/22/2016 1459     I think back like this she this got acute this I do not  Impression and Plan: Ms. Ware is a very nice 83 year old postmenopausal white female.  She has a solitary  metastatic focus from breast cancer.  Her initial breast cancer was probably 7 years ago.  It was estrogen positive. 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  The HER2 was equivocal on initial staining.  She continues  to do well on Enhertu  without evidence of toxicity.  Her echocardiogram is quite reassuring.  She probably will be due for a PET scan in March.  Will have her back to see us  in another 3 weeks.  After the next treatment, we will then plan for a PET scan.  She is due for a repeat ECHO which I have ordered today. Again reviewed red flags of toxicities. She will also continue follow up with her cardiologist.   RTC 3 weeks MD, labs (CBC w/, CMP, CA 27.29, L[facility]), Enhertu    Maude JONELLE Crease, MD 2/6/202512:42 PM

## 2023-04-09 LAB — CANCER ANTIGEN 27.29: CA 27.29: 21.5 U/mL (ref 0.0–38.6)

## 2023-04-17 ENCOUNTER — Encounter: Payer: Self-pay | Admitting: Hematology & Oncology

## 2023-04-19 ENCOUNTER — Encounter: Payer: Self-pay | Admitting: *Deleted

## 2023-04-19 ENCOUNTER — Encounter: Payer: Self-pay | Admitting: Adult Health

## 2023-04-19 MED ORDER — DONEPEZIL HCL 5 MG PO TABS: 5.0000 mg | ORAL_TABLET | Freq: Every day | ORAL | 3 refills | Status: DC

## 2023-04-30 ENCOUNTER — Inpatient Hospital Stay: Payer: Medicare Other

## 2023-04-30 ENCOUNTER — Inpatient Hospital Stay (HOSPITAL_BASED_OUTPATIENT_CLINIC_OR_DEPARTMENT_OTHER): Payer: Medicare Other | Admitting: Hematology & Oncology

## 2023-04-30 ENCOUNTER — Encounter: Payer: Self-pay | Admitting: Hematology & Oncology

## 2023-04-30 VITALS — BP 109/70 | HR 69 | Resp 17

## 2023-04-30 VITALS — BP 136/68 | HR 79 | Temp 97.8°F | Resp 17 | Wt 130.0 lb

## 2023-04-30 DIAGNOSIS — E032 Hypothyroidism due to medicaments and other exogenous substances: Secondary | ICD-10-CM

## 2023-04-30 DIAGNOSIS — J90 Pleural effusion, not elsewhere classified: Secondary | ICD-10-CM

## 2023-04-30 DIAGNOSIS — D72829 Elevated white blood cell count, unspecified: Secondary | ICD-10-CM

## 2023-04-30 DIAGNOSIS — C50412 Malignant neoplasm of upper-outer quadrant of left female breast: Secondary | ICD-10-CM

## 2023-04-30 DIAGNOSIS — H401111 Primary open-angle glaucoma, right eye, mild stage: Secondary | ICD-10-CM

## 2023-04-30 DIAGNOSIS — C7951 Secondary malignant neoplasm of bone: Secondary | ICD-10-CM | POA: Diagnosis not present

## 2023-04-30 DIAGNOSIS — R7303 Prediabetes: Secondary | ICD-10-CM

## 2023-04-30 DIAGNOSIS — E042 Nontoxic multinodular goiter: Secondary | ICD-10-CM

## 2023-04-30 DIAGNOSIS — H401122 Primary open-angle glaucoma, left eye, moderate stage: Secondary | ICD-10-CM

## 2023-04-30 DIAGNOSIS — M1990 Unspecified osteoarthritis, unspecified site: Secondary | ICD-10-CM

## 2023-04-30 DIAGNOSIS — M898X5 Other specified disorders of bone, thigh: Secondary | ICD-10-CM

## 2023-04-30 DIAGNOSIS — Z79899 Other long term (current) drug therapy: Secondary | ICD-10-CM

## 2023-04-30 DIAGNOSIS — Z0000 Encounter for general adult medical examination without abnormal findings: Secondary | ICD-10-CM

## 2023-04-30 DIAGNOSIS — I351 Nonrheumatic aortic (valve) insufficiency: Secondary | ICD-10-CM

## 2023-04-30 DIAGNOSIS — K222 Esophageal obstruction: Secondary | ICD-10-CM

## 2023-04-30 DIAGNOSIS — I6389 Other cerebral infarction: Secondary | ICD-10-CM

## 2023-04-30 DIAGNOSIS — I4819 Other persistent atrial fibrillation: Secondary | ICD-10-CM

## 2023-04-30 DIAGNOSIS — K219 Gastro-esophageal reflux disease without esophagitis: Secondary | ICD-10-CM

## 2023-04-30 DIAGNOSIS — R4182 Altered mental status, unspecified: Secondary | ICD-10-CM

## 2023-04-30 DIAGNOSIS — Z124 Encounter for screening for malignant neoplasm of cervix: Secondary | ICD-10-CM

## 2023-04-30 DIAGNOSIS — I5033 Acute on chronic diastolic (congestive) heart failure: Secondary | ICD-10-CM

## 2023-04-30 DIAGNOSIS — M8580 Other specified disorders of bone density and structure, unspecified site: Secondary | ICD-10-CM

## 2023-04-30 DIAGNOSIS — I5032 Chronic diastolic (congestive) heart failure: Secondary | ICD-10-CM

## 2023-04-30 DIAGNOSIS — I639 Cerebral infarction, unspecified: Secondary | ICD-10-CM

## 2023-04-30 DIAGNOSIS — N951 Menopausal and female climacteric states: Secondary | ICD-10-CM

## 2023-04-30 DIAGNOSIS — C3410 Malignant neoplasm of upper lobe, unspecified bronchus or lung: Secondary | ICD-10-CM

## 2023-04-30 DIAGNOSIS — R053 Chronic cough: Secondary | ICD-10-CM

## 2023-04-30 DIAGNOSIS — Z171 Estrogen receptor negative status [ER-]: Secondary | ICD-10-CM | POA: Diagnosis not present

## 2023-04-30 DIAGNOSIS — M810 Age-related osteoporosis without current pathological fracture: Secondary | ICD-10-CM

## 2023-04-30 DIAGNOSIS — I517 Cardiomegaly: Secondary | ICD-10-CM

## 2023-04-30 DIAGNOSIS — R942 Abnormal results of pulmonary function studies: Secondary | ICD-10-CM

## 2023-04-30 DIAGNOSIS — Z5112 Encounter for antineoplastic immunotherapy: Secondary | ICD-10-CM | POA: Diagnosis not present

## 2023-04-30 DIAGNOSIS — Z170 Estrogen receptor positive status [ER+]: Secondary | ICD-10-CM | POA: Diagnosis not present

## 2023-04-30 DIAGNOSIS — E559 Vitamin D deficiency, unspecified: Secondary | ICD-10-CM

## 2023-04-30 DIAGNOSIS — G40909 Epilepsy, unspecified, not intractable, without status epilepticus: Secondary | ICD-10-CM

## 2023-04-30 DIAGNOSIS — N1831 Chronic kidney disease, stage 3a: Secondary | ICD-10-CM

## 2023-04-30 DIAGNOSIS — C50919 Malignant neoplasm of unspecified site of unspecified female breast: Secondary | ICD-10-CM | POA: Diagnosis not present

## 2023-04-30 DIAGNOSIS — R413 Other amnesia: Secondary | ICD-10-CM

## 2023-04-30 DIAGNOSIS — J385 Laryngeal spasm: Secondary | ICD-10-CM

## 2023-04-30 DIAGNOSIS — K5730 Diverticulosis of large intestine without perforation or abscess without bleeding: Secondary | ICD-10-CM

## 2023-04-30 DIAGNOSIS — Z8673 Personal history of transient ischemic attack (TIA), and cerebral infarction without residual deficits: Secondary | ICD-10-CM

## 2023-04-30 DIAGNOSIS — R8271 Bacteriuria: Secondary | ICD-10-CM

## 2023-04-30 DIAGNOSIS — I340 Nonrheumatic mitral (valve) insufficiency: Secondary | ICD-10-CM

## 2023-04-30 DIAGNOSIS — Z85118 Personal history of other malignant neoplasm of bronchus and lung: Secondary | ICD-10-CM

## 2023-04-30 DIAGNOSIS — I272 Pulmonary hypertension, unspecified: Secondary | ICD-10-CM

## 2023-04-30 DIAGNOSIS — J810 Acute pulmonary edema: Secondary | ICD-10-CM

## 2023-04-30 DIAGNOSIS — I4891 Unspecified atrial fibrillation: Secondary | ICD-10-CM

## 2023-04-30 DIAGNOSIS — I10 Essential (primary) hypertension: Secondary | ICD-10-CM

## 2023-04-30 DIAGNOSIS — J45991 Cough variant asthma: Secondary | ICD-10-CM

## 2023-04-30 LAB — CMP (CANCER CENTER ONLY)
ALT: 24 U/L (ref 0–44)
AST: 28 U/L (ref 15–41)
Albumin: 3.8 g/dL (ref 3.5–5.0)
Alkaline Phosphatase: 79 U/L (ref 38–126)
Anion gap: 8 (ref 5–15)
BUN: 20 mg/dL (ref 8–23)
CO2: 28 mmol/L (ref 22–32)
Calcium: 9.8 mg/dL (ref 8.9–10.3)
Chloride: 104 mmol/L (ref 98–111)
Creatinine: 0.88 mg/dL (ref 0.44–1.00)
GFR, Estimated: >60 mL/min (ref >60)
Glucose, Bld: 126 mg/dL — ABNORMAL HIGH (ref 70–99)
Potassium: 3.9 mmol/L (ref 3.5–5.1)
Sodium: 140 mmol/L (ref 135–145)
Total Bilirubin: 0.6 mg/dL (ref 0.0–1.2)
Total Protein: 6.4 g/dL — ABNORMAL LOW (ref 6.5–8.1)

## 2023-04-30 LAB — CBC WITH DIFFERENTIAL (CANCER CENTER ONLY)
Abs Immature Granulocytes: 0.05 10*3/uL (ref 0.00–0.07)
Basophils Absolute: 0 10*3/uL (ref 0.0–0.1)
Basophils Relative: 1 %
Eosinophils Absolute: 0 10*3/uL (ref 0.0–0.5)
Eosinophils Relative: 0 %
HCT: 37.3 % (ref 36.0–46.0)
Hemoglobin: 12.4 g/dL (ref 12.0–15.0)
Immature Granulocytes: 1 %
Lymphocytes Relative: 16 %
Lymphs Abs: 0.9 10*3/uL (ref 0.7–4.0)
MCH: 33.2 pg (ref 26.0–34.0)
MCHC: 33.2 g/dL (ref 30.0–36.0)
MCV: 99.7 fL (ref 80.0–100.0)
Monocytes Absolute: 0.8 10*3/uL (ref 0.1–1.0)
Monocytes Relative: 14 %
Neutro Abs: 3.8 10*3/uL (ref 1.7–7.7)
Neutrophils Relative %: 68 %
Platelet Count: 192 10*3/uL (ref 150–400)
RBC: 3.74 MIL/uL — ABNORMAL LOW (ref 3.87–5.11)
RDW: 18.3 % — ABNORMAL HIGH (ref 11.5–15.5)
WBC Count: 5.6 10*3/uL (ref 4.0–10.5)
nRBC: 0 % (ref 0.0–0.2)

## 2023-04-30 LAB — LACTATE DEHYDROGENASE: LDH: 267 U/L — ABNORMAL HIGH (ref 98–192)

## 2023-04-30 MED ORDER — PALONOSETRON HCL INJECTION 0.25 MG/5ML
0.2500 mg | Freq: Once | INTRAVENOUS | Status: AC
  Administered 2023-04-30: 0.25 mg via INTRAVENOUS
  Filled 2023-04-30: qty 5

## 2023-04-30 MED ORDER — SODIUM CHLORIDE 0.9% FLUSH
10.0000 mL | INTRAVENOUS | Status: DC | PRN
  Administered 2023-04-30: 10 mL

## 2023-04-30 MED ORDER — HEPARIN SOD (PORK) LOCK FLUSH 100 UNIT/ML IV SOLN
500.0000 [IU] | Freq: Once | INTRAVENOUS | Status: AC | PRN
  Administered 2023-04-30: 500 [IU]

## 2023-04-30 MED ORDER — DIPHENHYDRAMINE HCL 25 MG PO CAPS
25.0000 mg | ORAL_CAPSULE | Freq: Once | ORAL | Status: AC
  Administered 2023-04-30: 25 mg via ORAL
  Filled 2023-04-30: qty 1

## 2023-04-30 MED ORDER — ACETAMINOPHEN 325 MG PO TABS
650.0000 mg | ORAL_TABLET | Freq: Once | ORAL | Status: AC
  Administered 2023-04-30: 650 mg via ORAL
  Filled 2023-04-30: qty 2

## 2023-04-30 MED ORDER — DEXTROSE 5 % IV SOLN: Freq: Once | INTRAVENOUS | Status: AC

## 2023-04-30 MED ORDER — SODIUM CHLORIDE 0.9 % IV SOLN
150.0000 mg | Freq: Once | INTRAVENOUS | Status: AC
  Administered 2023-04-30: 150 mg via INTRAVENOUS
  Filled 2023-04-30: qty 150

## 2023-04-30 MED ORDER — DEXAMETHASONE SODIUM PHOSPHATE 10 MG/ML IJ SOLN
10.0000 mg | Freq: Once | INTRAMUSCULAR | Status: AC
Start: 2023-04-30 — End: 2023-04-30
  Administered 2023-04-30: 10 mg via INTRAVENOUS
  Filled 2023-04-30: qty 1

## 2023-04-30 MED ORDER — FAM-TRASTUZUMAB DERUXTECAN-NXKI CHEMO 100 MG IV SOLR
300.0000 mg | Freq: Once | INTRAVENOUS | Status: AC
  Administered 2023-04-30: 300 mg via INTRAVENOUS
  Filled 2023-04-30: qty 15

## 2023-04-30 NOTE — Patient Instructions (Signed)
 [facility] - A DEPT OF [facility]  Discharge Instructions: Thank you for choosing [facility] to provide your oncology and hematology care.   If you have a lab appointment with the Cancer Center, please go directly to the Cancer Center and check in at the registration area.  Wear comfortable clothing and clothing appropriate for easy access to any Portacath or PICC line.   We strive to give you quality time with your provider. You may need to reschedule your appointment if you arrive late (15 or more minutes).  Arriving late affects you and other patients whose appointments are after yours.  Also, if you miss three or more appointments without notifying the office, you may be dismissed from the clinic at the provider's discretion.      For prescription refill requests, have your pharmacy contact our office and allow 72 hours for refills to be completed.    Today you received the following chemotherapy and/or immunotherapy agents Enhertu      To help prevent nausea and vomiting after your treatment, we encourage you to take your nausea medication as directed.  BELOW ARE SYMPTOMS THAT SHOULD BE REPORTED IMMEDIATELY: *FEVER GREATER THAN 100.4 F (38 C) OR HIGHER *CHILLS OR SWEATING *NAUSEA AND VOMITING THAT IS NOT CONTROLLED WITH YOUR NAUSEA MEDICATION *UNUSUAL SHORTNESS OF BREATH *UNUSUAL BRUISING OR BLEEDING *URINARY PROBLEMS (pain or burning when urinating, or frequent urination) *BOWEL PROBLEMS (unusual diarrhea, constipation, pain near the anus) TENDERNESS IN MOUTH AND THROAT WITH OR WITHOUT PRESENCE OF ULCERS (sore throat, sores in mouth, or a toothache) UNUSUAL RASH, SWELLING OR PAIN  UNUSUAL VAGINAL DISCHARGE OR ITCHING   Items with * indicate a potential emergency and should be followed up as soon as possible or go to the Emergency Department if any problems should occur.  Please show the CHEMOTHERAPY ALERT CARD or IMMUNOTHERAPY  ALERT CARD at check-in to the Emergency Department and triage nurse. Should you have questions after your visit or need to cancel or reschedule your appointment, please contact [facility] - A DEPT OF [facility]  [phone] and follow the prompts.  Office hours are 8:00 a.m. to 4:30 p.m. Monday - Friday. Please note that voicemails left after 4:00 p.m. may not be returned until the following business day.  We are closed weekends and major holidays. You have access to a nurse at all times for urgent questions. Please call the main number to the clinic [phone] and follow the prompts.  For any non-urgent questions, you may also contact your provider using MyChart. We now offer e-Visits for anyone 48 and older to request care online for non-urgent symptoms. For details visit [email or web address].   Also download the MyChart app! Go to the app store, search "MyChart", open the app, select , and log in with your MyChart username and password.

## 2023-04-30 NOTE — Progress Notes (Signed)
 Hematology and Oncology Follow Up Visit  Patricia [name] [id number] 1940/07/19 83 y.o. 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 Q4-6 months      Interim History:  Patricia [name] is back for follow-up.  So far, everything seems to be doing pretty well with her.  She does have some discomfort over in the right hip area.  He  She has had no problems with cough.  She has had no issues with nausea or vomiting.  There has been no change in bowel or bladder habits.  She has had no bleeding.  She is getting no leg swelling.  She continues on anticoagulation for her atrial fibrillation.  She is on quite a few different medications for her other health issues.  Her last CA 27.29 was stable at 21.  Currently, I would have to say that her performance status is probably ECOG 1-2.     Wt Readings from Last 3 Encounters:  04/30/23 130 lb (59 kg)  04/08/23 132 lb 12.8 oz (60.2 kg)  03/18/23 134 lb (60.8 kg)   Medications:  Current Outpatient Medications:    albuterol (PROVENTIL HFA;VENTOLIN HFA) 108 (90 Base) MCG/ACT inhaler, Inhale 2 puffs into the lungs every 6 (six) hours as needed for wheezing or shortness of breath., Disp: , Rfl:    apixaban (ELIQUIS) 5 MG TABS tablet, Take 1 tablet (5 mg total) by mouth 2 (two) times daily., Disp: 180 tablet, Rfl: 3   Apoaequorin (PREVAGEN) 10 MG CAPS, Take 1 capsule by mouth every morning., Disp: 90 capsule, Rfl: 3   atorvastatin (LIPITOR) 40 MG tablet, TAKE 1 TABLET DAILY BEFORE SUPPER, Disp: 90 tablet, Rfl: 1   bimatoprost  (LUMIGAN) 0.01 % SOLN, Place 1 drop into both eyes 2 (two) times daily., Disp: , Rfl:    calcium-vitamin D (OSCAL WITH D) 500-200 MG-UNIT tablet, Take 1 tablet by mouth daily with breakfast., Disp: 30 tablet, Rfl: 0   Cholecalciferol (VITAMIN D3) 50 MCG (2000 UT) capsule, Take 2,000 Units by mouth daily., Disp: , Rfl:    dexamethasone (DECADRON) 4 MG tablet, Take 2 tablets (8 mg) by mouth daily for 3 days starting the day after chemotherapy. Take with food., Disp: 30 tablet, Rfl: 1   digoxin (LANOXIN) 0.125 MG tablet, Take 1 tablet (0.125 mg total) by mouth daily., Disp: 90 tablet, Rfl: 3   donepezil (ARICEPT) 5 MG tablet, Take 1 tablet (5 mg total) by mouth at bedtime., Disp: 90 tablet, Rfl: 3   dorzolamide-timolol (COSOPT) 22.3-6.8 MG/ML ophthalmic solution, Place 1 drop into both eyes at bedtime., Disp: , Rfl:    fluticasone (FLONASE) 50 MCG/ACT nasal spray, 1 spray each nostril following sinus rinses twice daily, Disp: 16 g, Rfl: 2   fluticasone-salmeterol (ADVAIR HFA) 230-21 MCG/ACT inhaler, USE 2 INHALATIONS TWICE A DAY, Disp: 36 g, Rfl: 3   folic acid (FOLVITE) 400 MCG tablet, Take 400 mcg by mouth daily., Disp: , Rfl:    furosemide (LASIX) 20 MG tablet, Take 1 tablet (20 mg total) by mouth 2 (two) times daily. Take additional 20 mg as needed for swelling, Disp: 180 tablet, Rfl: 2  gabapentin (NEURONTIN) 400 MG capsule, TAKE 1 CAPSULE TWICE A DAY, Disp: 180 capsule, Rfl: 3   L-Methylfolate-B12-B6-B2 (CEREFOLIN) 07-31-48-5 MG TABS, Take 1 tablet by mouth every morning., Disp: 90 tablet, Rfl: 3   levETIRAcetam (KEPPRA XR) 500 MG 24 hr tablet, TAKE 1 TABLET AT BEDTIME, Disp: 90 tablet, Rfl: 3   lidocaine-prilocaine (EMLA) cream, Apply a dime size of cream to port-a-cath 1-2 hours prior to access. Cover with Warren Lacy., Disp: 30 g, Rfl: 3   loratadine (CLARITIN) 10 MG tablet, Take 10 mg by mouth daily., Disp: , Rfl:    memantine (NAMENDA) 10 MG tablet, TAKE 1 TABLET TWICE A DAY, Disp: 180 tablet,  Rfl: 3   metoprolol tartrate (LOPRESSOR) 50 MG tablet, Take 1 tablet (50 mg total) by mouth 2 (two) times daily., Disp: 180 tablet, Rfl: 3   Multiple Vitamins-Iron (MULTIVITAMIN/IRON) TABS, Take 1 tablet by mouth daily., Disp: , Rfl:    Netarsudil Dimesylate (RHOPRESSA) 0.02 % SOLN, Place 1 drop into both eyes at bedtime., Disp: , Rfl:    ondansetron (ZOFRAN) 8 MG tablet, Take 1 tablet (8 mg total) by mouth every 8 (eight) hours as needed for nausea or vomiting. Start on the third day after chemotherapy., Disp: 30 tablet, Rfl: 1   OVER THE COUNTER MEDICATION, Take 1 tablet by mouth daily. Zyflamend otc herbal supplement, Disp: , Rfl:    pantoprazole (PROTONIX) 40 MG tablet, TAKE 1 TABLET DAILY, Disp: 90 tablet, Rfl: 3   potassium chloride SA (KLOR-CON M) 20 MEQ tablet, Take 1 tablet (20 mEq total) by mouth daily., Disp: 90 tablet, Rfl: 3   prochlorperazine (COMPAZINE) 10 MG tablet, Take 1 tablet (10 mg total) by mouth every 6 (six) hours as needed for nausea or vomiting., Disp: 30 tablet, Rfl: 1   thyroid (ARMOUR THYROID) 15 MG tablet, Take 1 tablet (15 mg total) by mouth daily. Take with 60mg  for a total dose of 75mg  daily., Disp: 90 tablet, Rfl: 1   thyroid (ARMOUR THYROID) 60 MG tablet, Take 1 tablet (60 mg total) by mouth daily. Takes with 15mg  daily., Disp: 90 tablet, Rfl: 1   traMADol (ULTRAM) 50 MG tablet, Take 1 tablet (50 mg total) by mouth every 6 (six) hours as needed (post op pain)., Disp: 60 tablet, Rfl: 0 No current facility-administered medications for this visit.  Facility-Administered Medications Ordered in Other Visits:    acetaminophen (TYLENOL) tablet 650 mg, 650 mg, Oral, Once, Braylin Formby, Rose Phi, MD   acetaminophen (TYLENOL) tablet 650 mg, 650 mg, Oral, Once, Nell Schrack, Rose Phi, MD   dexamethasone (DECADRON) 10 mg in sodium chloride 0.9 % 50 mL IVPB, 10 mg, Intravenous, Once, Tawsha Terrero, Rose Phi, MD   dextrose 5 % solution, , Intravenous, Once, Kamarrion Stfort, Rose Phi, MD   diphenhydrAMINE  (BENADRYL) capsule 25 mg, 25 mg, Oral, Once, Deaundra Kutzer, Rose Phi, MD   diphenhydrAMINE (BENADRYL) capsule 50 mg, 50 mg, Oral, Once, Clinton Dragone, Rose Phi, MD   fam-trastuzumab deruxtecan-nxki (ENHERTU) 300 mg in dextrose 5 % 100 mL chemo infusion, 300 mg, Intravenous, Once, Valary Manahan, Rose Phi, MD   fosaprepitant (EMEND) 150 mg in sodium chloride 0.9 % 145 mL IVPB, 150 mg, Intravenous, Once, Teosha Casso, Rose Phi, MD   fosaprepitant (EMEND) 150 mg in sodium chloride 0.9 % 145 mL IVPB, 150 mg, Intravenous, Once, Ivann Trimarco, Rose Phi, MD   heparin lock flush 100 unit/mL, 500 Units, Intracatheter, Once PRN, Josph Macho, MD   palonosetron (ALOXI) injection 0.25 mg, 0.25 mg, Intravenous, Once, Arlan Organ  R, MD   sodium chloride flush (NS) 0.9 % injection 10 mL, 10 mL, Intracatheter, PRN, Josph Macho, MD  Allergies:  Allergies  Allergen Reactions   Combigan [Brimonidine Tartrate-Timolol] Itching    Per patient made eyes red, sore, and sensitivity to light   Sulfa Antibiotics Hives and Rash   Sulfamethoxazole-Trimethoprim Hives    Past Medical History, Surgical history, Social history, and Family History were reviewed and updated.  Review of Systems: Review of Systems  Constitutional:  Negative for appetite change and fatigue.  HENT:  Negative.    Eyes: Negative.   Respiratory:  Positive for cough (chronic unchanged). Negative for chest tightness and shortness of breath (chronic and stable).   Cardiovascular: Negative.   Gastrointestinal: Negative.   Endocrine: Negative.   Genitourinary: Negative.    Musculoskeletal:  Negative for gait problem.  Skin: Negative.   Neurological:  Negative for dizziness, gait problem, headaches and light-headedness.  Hematological: Negative.   Psychiatric/Behavioral: Negative.      Physical Exam:  weight is 130 lb (59 kg). Her oral temperature is 97.8 F (36.6 C). Her blood pressure is 136/68 and her pulse is 79. Her respiration is 17 and oxygen saturation is  97%.   Wt Readings from Last 3 Encounters:  04/30/23 130 lb (59 kg)  04/08/23 132 lb 12.8 oz (60.2 kg)  03/18/23 134 lb (60.8 kg)    Physical Exam Vitals reviewed.  HENT:     Head: Normocephalic and atraumatic.  Eyes:     Pupils: Pupils are equal, round, and reactive to light.  Cardiovascular:     Rate and Rhythm: Normal rate and regular rhythm.     Heart sounds: Normal heart sounds.  Pulmonary:     Effort: Pulmonary effort is normal.     Breath sounds: Normal breath sounds.  Abdominal:     General: Bowel sounds are normal.     Palpations: Abdomen is soft.  Musculoskeletal:        General: No tenderness or deformity. Normal range of motion.     Cervical back: Normal range of motion.     Left lower leg: No edema.  Lymphadenopathy:     Cervical: No cervical adenopathy.  Skin:    General: Skin is warm and dry.     Findings: No erythema or rash.  Neurological:     Mental Status: She is alert and oriented to person, place, and time.  Psychiatric:        Behavior: Behavior normal.        Thought Content: Thought content normal.        Judgment: Judgment normal.     Lab Results  Component Value Date   WBC 5.6 04/30/2023   HGB 12.4 04/30/2023   HCT 37.3 04/30/2023   MCV 99.7 04/30/2023   PLT 192 04/30/2023     Chemistry      Component Value Date/Time   NA 140 04/30/2023 1141   NA 142 12/24/2022 0925   NA 141 09/22/2016 1459   K 3.9 04/30/2023 1141   K 3.8 09/22/2016 1459   CL 104 04/30/2023 1141   CO2 28 04/30/2023 1141   CO2 26 09/22/2016 1459   BUN 20 04/30/2023 1141   BUN 35 (H) 12/24/2022 0925   BUN 19.8 09/22/2016 1459   CREATININE 0.88 04/30/2023 1141   CREATININE 1.1 09/22/2016 1459      Component Value Date/Time   CALCIUM 9.8 04/30/2023 1141   CALCIUM 9.2 09/22/2016 1459   ALKPHOS  79 04/30/2023 1141   ALKPHOS 98 09/22/2016 1459   AST 28 04/30/2023 1141   AST 15 09/22/2016 1459   ALT 24 04/30/2023 1141   ALT 21 09/22/2016 1459   BILITOT 0.6  04/30/2023 1141   BILITOT 0.65 09/22/2016 1459      Impression and Plan: Patricia [name] is a very nice 83 year old postmenopausal white female.  She has a solitary metastatic focus from breast cancer.  Her initial breast cancer was probably 7 years ago.  It was estrogen positive. 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  The HER2 was equivocal on initial staining.  She continues to do well on Enhertu without evidence of toxicity.  Her echocardiogram is quite reassuring.  We will set her up with a echocardiogram next month.  She will also get her Xgeva next month.    Josph Macho, MD 2/28/20251:22 PM

## 2023-05-01 LAB — CANCER ANTIGEN 27.29: CA 27.29: 30.3 U/mL (ref 0.0–38.6)

## 2023-05-04 ENCOUNTER — Encounter (HOSPITAL_COMMUNITY)
Admission: RE | Admit: 2023-05-04 | Discharge: 2023-05-04 | Disposition: A | Discharge status: Home / Self Care | Source: Ambulatory Visit | Attending: Hematology & Oncology | Admitting: Hematology & Oncology

## 2023-05-04 DIAGNOSIS — C50912 Malignant neoplasm of unspecified site of left female breast: Secondary | ICD-10-CM | POA: Diagnosis not present

## 2023-05-04 DIAGNOSIS — C7951 Secondary malignant neoplasm of bone: Secondary | ICD-10-CM | POA: Insufficient documentation

## 2023-05-04 DIAGNOSIS — C50412 Malignant neoplasm of upper-outer quadrant of left female breast: Secondary | ICD-10-CM | POA: Diagnosis not present

## 2023-05-04 DIAGNOSIS — Z170 Estrogen receptor positive status [ER+]: Secondary | ICD-10-CM | POA: Diagnosis not present

## 2023-05-04 DIAGNOSIS — R918 Other nonspecific abnormal finding of lung field: Secondary | ICD-10-CM | POA: Diagnosis not present

## 2023-05-04 LAB — GLUCOSE, CAPILLARY: Glucose-Capillary: 59 mg/dL — ABNORMAL LOW (ref 70–99)

## 2023-05-04 MED ORDER — FLUDEOXYGLUCOSE F - 18 (FDG) INJECTION
6.5000 | Freq: Once | INTRAVENOUS | Status: AC
  Administered 2023-05-04: 6.5 via INTRAVENOUS

## 2023-05-04 NOTE — Progress Notes (Unsigned)
 Cardiology Office Note:    Date:  05/05/2023   ID:  Patricia [name], DOB Dec 19, 1940, MRN [id number]  PCP:  Patricia Quitter, PA  Cardiologist:  Patricia Magic, MD   Referring MD: Patricia Quitter, PA   Chief Complaint  Patient presents with   Congestive Heart Failure   Hypertension   Atrial Fibrillation   Mitral Regurgitation   Sleep Apnea   Shortness of Breath    History of Present Illness:    Patricia [name] is a 83 y.o. female with history of chronic AFib on Eliquis (amiodarone stopped because of prolonged QT), CVA, chronic diastolic CHF, mild  AI, normal NST 2017, lung CA resection 2003, breast CA s/p lumpectomy/Tamoxifen/XRT, HTN, HLD.  2D echo in 2018 showed moderate PHTN with PASP and RHC 04/2016 with borderline pulmonary HTN.  Repeat 2D echo 12/2019 showed normal LVF with mild to moderate MR and normal PASP.  She is followed in afib clinic and now permanent atrial fibrillation.    She underwent sleep study in Nov 2021 and was found to have mild OSA with an AHI of 9.6/hr with no central apneas and underwent CPAP titration but could not be adequately treated due to ongoing events and underwent BiPAP titration and was ultimately placed on BIPAP.   2D echocardiogram done for follow-up of her mitral regurgitation which showed normal LV size and systolic function, mild RV enlargement and moderate right atrial enlargement as well as moderate to severe MR and TR which are new.  She underwent TEE on 01/07/2021 showing low normal LV function with EF 50 to 55% with severe left atrial enlargement, severe right atrial enlargement and moderate to severe mitral regurgitation with myxomatous tricuspid valve and moderate TR.   She was referred to structural heart clinic and was seen by Dr. Excell [name] who felt she had moderate to severe 3+ mitral regurgitation due to a combination of atrial functional MR with marked left atrial enlargement as well as degenerative mitral regurgitation with severe  calcification of the P2 scallop of the posterior leaflet.  It was felt she was not a candidate for transcatheter therapies because of severe posterior leaflet calcification.  Ongoing medical therapy was recommended at that time given lack of significant symptoms.  She has been continued on annual echo reassessments.  She is here today for followup and is doing well.  She denies any chest pain or pressure, SOB, DOE, PND, orthopnea, dizziness, palpitations or syncope. She occasionally has some mild LE edema.  She is compliant with her meds and is tolerating meds with no SE.    She is doing well with her PAP device and thinks that she has gotten used to it.  She tolerates the mask and feels the pressure is adequate.  Since going on PAP she feels rested in the am and has no significant daytime sleepiness.  She denies any significant mouth or nasal dryness or nasal congestion.  She does not think that he snores.    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 Dx 04/2015 at time of stroke. b. DCCV 06/2015 - did not hold. Amio started then stopped due to QT prolongation.   Chronic diastolic heart failure (HCC) 06/17/2015   CKD (chronic kidney disease), stage III (HCC)  Cough variant asthma    Dementia (HCC)    Diverticulosis    Essential hypertension    GERD (gastroesophageal reflux disease)    Glaucoma    Hiatal hernia    Hyperlipidemia    Hypothyroidism    Internal hemorrhoids    Laryngospasm    Mitral regurgitation    mild to moderate by echo 12/2019 and moderate by TEE 12/2020   Multinodular goiter    Osteopenia    Postmenopausal    Pulmonary HTN (HCC)     Moderate with PASP by echo 2018 likely Group 2 from pulmonary venous HTN from CHF>>normal PAP by echo 12/2019   Radiation 10/22/14-11/23/14   Left  Breast   Stricture and stenosis of cervix    Stroke (HCC)    TIAs    Past Surgical History:  Procedure Laterality Date   BREAST LUMPECTOMY Left    BREAST SURGERY     CARDIOVERSION N/A 06/24/2015   Procedure: CARDIOVERSION;  Surgeon: Quintella Reichert, MD;  Location: [facility];  Service: Cardiovascular;  Laterality: N/A;   CARDIOVERSION N/A 05/19/2016   Procedure: CARDIOVERSION;  Surgeon: Quintella Reichert, MD;  Location: [facility];  Service: Cardiovascular;  Laterality: N/A;   COLONOSCOPY     EYE SURGERY Bilateral    cataract removal by Dr. Burgess Estelle   FEMUR IM NAIL Left 07/28/2022   Procedure: INTRAMEDULLARY (IM) NAIL FEMORAL;  Surgeon: Marcene Corning, MD;  Location: [facility];  Service: Orthopedics;  Laterality: Left;   IR IMAGING GUIDED PORT INSERTION  10/30/2022   LUNG CANCER SURGERY  2003   resection carcinoid lingula-lt upper lobe   RADIOACTIVE SEED GUIDED PARTIAL MASTECTOMY WITH AXILLARY SENTINEL LYMPH NODE BIOPSY Left 06/26/2014   Procedure: RADIOACTIVE SEED GUIDED PARTIAL MASTECTOMY WITH AXILLARY SENTINEL LYMPH NODE BIOPSY;  Surgeon: Almond Lint, MD;  Location: [facility];  Service: General;  Laterality: Left;   RE-EXCISION OF BREAST LUMPECTOMY Left 08/07/2014   Procedure: RE-EXCISION OF LEFT BREAST LUMPECTOMY;  Surgeon: Almond Lint, MD;  Location: [facility];  Service: General;  Laterality: Left;   RIGHT HEART CATH N/A 04/27/2016   Procedure: Right Heart Cath;  Surgeon: Laurey Morale, MD;  Location: [facility];  Service: Cardiovascular;  Laterality: N/A;   TEE WITHOUT CARDIOVERSION N/A 01/07/2021   Procedure: TRANSESOPHAGEAL ECHOCARDIOGRAM (TEE);  Surgeon: Vesta Mixer, MD;  [facility];  Service: Cardiovascular;  Laterality: N/A;    Current Medications: Current Meds  Medication Sig   albuterol (PROVENTIL HFA;VENTOLIN HFA) 108 (90 Base) MCG/ACT inhaler Inhale 2 puffs into the lungs every 6 (six) hours as needed for  wheezing or shortness of breath.   apixaban (ELIQUIS) 5 MG TABS tablet Take 1 tablet (5 mg total) by mouth 2 (two) times daily.   Apoaequorin (PREVAGEN) 10 MG CAPS Take 1 capsule by mouth every morning.   atorvastatin (LIPITOR) 40 MG tablet TAKE 1 TABLET DAILY BEFORE SUPPER   bimatoprost (LUMIGAN) 0.01 % SOLN Place 1 drop into both eyes 2 (two) times daily.   calcium-vitamin D (OSCAL WITH D) 500-200 MG-UNIT tablet Take 1 tablet by mouth daily with breakfast.   Cholecalciferol (VITAMIN D3) 50 MCG (2000 UT) capsule Take 2,000 Units by mouth daily.   dexamethasone (DECADRON) 4 MG tablet Take 2 tablets (8 mg) by mouth daily for 3 days starting the day after chemotherapy. Take with food.   digoxin (LANOXIN) 0.125 MG tablet Take 1 tablet (0.125 mg total) by mouth daily.   donepezil (  ARICEPT) 5 MG tablet Take 1 tablet (5 mg total) by mouth at bedtime.   dorzolamide-timolol (COSOPT) 22.3-6.8 MG/ML ophthalmic solution Place 1 drop into both eyes at bedtime.   fluticasone (FLONASE) 50 MCG/ACT nasal spray 1 spray each nostril following sinus rinses twice daily   fluticasone-salmeterol (ADVAIR HFA) 230-21 MCG/ACT inhaler USE 2 INHALATIONS TWICE A DAY   folic acid (FOLVITE) 400 MCG tablet Take 400 mcg by mouth daily.   furosemide (LASIX) 20 MG tablet Take 1 tablet (20 mg total) by mouth 2 (two) times daily. Take additional 20 mg as needed for swelling   gabapentin (NEURONTIN) 400 MG capsule TAKE 1 CAPSULE TWICE A DAY   L-Methylfolate-B12-B6-B2 (CEREFOLIN) 07-31-48-5 MG TABS Take 1 tablet by mouth every morning.   levETIRAcetam (KEPPRA XR) 500 MG 24 hr tablet TAKE 1 TABLET AT BEDTIME   lidocaine-prilocaine (EMLA) cream Apply a dime size of cream to port-a-cath 1-2 hours prior to access. Cover with Warren Lacy.   loratadine (CLARITIN) 10 MG tablet Take 10 mg by mouth daily.   memantine (NAMENDA) 10 MG tablet TAKE 1 TABLET TWICE A DAY   metoprolol tartrate (LOPRESSOR) 50 MG tablet Take 1 tablet (50 mg total) by  mouth 2 (two) times daily.   Multiple Vitamins-Iron (MULTIVITAMIN/IRON) TABS Take 1 tablet by mouth daily.   Netarsudil Dimesylate (RHOPRESSA) 0.02 % SOLN Place 1 drop into both eyes at bedtime.   ondansetron (ZOFRAN) 8 MG tablet Take 1 tablet (8 mg total) by mouth every 8 (eight) hours as needed for nausea or vomiting. Start on the third day after chemotherapy.   OVER THE COUNTER MEDICATION Take 1 tablet by mouth daily. Zyflamend otc herbal supplement   pantoprazole (PROTONIX) 40 MG tablet TAKE 1 TABLET DAILY   potassium chloride SA (KLOR-CON M) 20 MEQ tablet Take 1 tablet (20 mEq total) by mouth daily.   prochlorperazine (COMPAZINE) 10 MG tablet Take 1 tablet (10 mg total) by mouth every 6 (six) hours as needed for nausea or vomiting.   thyroid (ARMOUR THYROID) 15 MG tablet Take 1 tablet (15 mg total) by mouth daily. Take with 60mg  for a total dose of 75mg  daily.   thyroid (ARMOUR THYROID) 60 MG tablet Take 1 tablet (60 mg total) by mouth daily. Takes with 15mg  daily.   traMADol (ULTRAM) 50 MG tablet Take 1 tablet (50 mg total) by mouth every 6 (six) hours as needed (post op pain).     Allergies:   Combigan [brimonidine tartrate-timolol], Sulfa antibiotics, and Sulfamethoxazole-trimethoprim   Social History   Socioeconomic History   Marital status: Married    Spouse name: Leonette Most   Number of children: 2   Years of education: Not on file   Highest education level: 12th grade  Occupational History   Occupation: Retired  Tobacco Use   Smoking status: Former    Current packs/day: 0.00    Average packs/day: 0.8 packs/day for 5.0 years (3.8 ttl pk-yrs)    Types: Cigarettes    Start date: 06/14/1961    Quit date: 06/15/1966    Years since quitting: 56.9    Passive exposure: Past   Smokeless tobacco: Never  Vaping Use   Vaping status: Never Used  Substance and Sexual Activity   Alcohol use: Not Currently    Comment: occasional wine   Drug use: No   Sexual activity: Not Currently     Birth control/protection: Post-menopausal  Other Topics Concern   Not on file  Social History Narrative   Retired. Lives  with husband.    Right Handed   Drinks 1-2 cups caffeine daily   Social Drivers of Health   Financial Resource Strain: Low Risk  (12/28/2022)   Overall Financial Resource Strain (CARDIA)    Difficulty of Paying Living Expenses: Not hard at all  Food Insecurity: No Food Insecurity (01/16/2023)   Hunger Vital Sign    Worried About Running Out of Food in the Last Year: Never true    Ran Out of Food in the Last Year: Never true  Transportation Needs: No Transportation Needs (01/16/2023)   PRAPARE - Administrator, Civil Service (Medical): No    Lack of Transportation (Non-Medical): No  Physical Activity: Inactive (01/16/2023)   Exercise Vital Sign    Days of Exercise per Week: 0 days    Minutes of Exercise per Session: 40 min  Stress: No Stress Concern Present (01/16/2023)   [employer] of Occupational Health - Occupational Stress Questionnaire    Feeling of Stress : Only a little  Social Connections: Moderately Integrated (01/16/2023)   Social Connection and Isolation Panel [NHANES]    Frequency of Communication with Friends and Family: Once a week    Frequency of Social Gatherings with Friends and Family: Once a week    Attends Religious Services: More than 4 times per year    Active Member of [employer] or Organizations: Yes    Attends Engineer, structural: More than 4 times per year    Marital Status: Married     Family History: The patient's family history includes Atrial fibrillation in her son; Hyperlipidemia in her father and mother; Hypertension in her father and mother; Stroke in her father and mother; Transient ischemic attack in her father. There is no history of Heart attack.  ROS:   Please see the history of present illness.    ROS  All other systems reviewed and negative.   EKGs/Labs/Other Studies Reviewed:    The  following studies were reviewed today:  2D echo 12/2019 IMPRESSIONS    1. Left ventricular ejection fraction, by estimation, is 55 to 60%. The  left ventricle has normal function. The left ventricle has no regional  wall motion abnormalities. There is mild left ventricular hypertrophy.  Left ventricular diastolic parameters  are indeterminate.   2. Right ventricular systolic function is mildly reduced. The right  ventricular size is normal. There is mildly elevated pulmonary artery  systolic pressure.   3. Left atrial size was severely dilated.   4. Right atrial size was severely dilated.   5. The mitral valve is normal in structure. Mild to moderate mitral valve  regurgitation. ERO 0.19 cm^2, RV 23 cc. Moderate mitral annular  calcification.   6. Tricuspid valve regurgitation is moderate.   7. The aortic valve is tricuspid. Aortic valve regurgitation is mild.  Mild to moderate aortic valve sclerosis/calcification is present, without  any evidence of aortic stenosis.   2D echo 11/2020 IMPRESSIONS    1. Left ventricular ejection fraction, by estimation, is 60 to 65%. The  left ventricle has normal function. The left ventricle has no regional  wall motion abnormalities. Left ventricular diastolic function could not  be evaluated. There is the  interventricular septum is flattened in systole and diastole, consistent  with right ventricular pressure and volume overload.   2. Right ventricular systolic function is mildly reduced. The right  ventricular size is mildly enlarged. There is moderately elevated  pulmonary artery systolic pressure. The estimated right ventricular  systolic  pressure is 46.9 mmHg.   3. Left atrial size was severely dilated.   4. Right atrial size was severely dilated.   5. The mitral valve is degenerative. Moderate to severe mitral valve  regurgitation. No evidence of mitral stenosis.   6. The tricuspid valve is abnormal. Tricuspid valve regurgitation is   moderate to severe.   7. The aortic valve is tricuspid. Aortic valve regurgitation is mild. No  aortic stenosis is present. Aortic regurgitation PHT measures 884 msec.   8. The inferior vena cava is normal in size with greater than 50%  respiratory variability, suggesting right atrial pressure of 3 mmHg.   Comparison(s): Changes from prior study are noted. MR/TR appear to be  moderate to severe on this study. LV function unchanged. AI remains mild.    Recent Labs: 08/13/2022: B Natriuretic Peptide 548.3 08/16/2022: Magnesium 2.2 12/24/2022: TSH 0.784 04/30/2023: ALT 24; BUN 20; Creatinine 0.88; Hemoglobin 12.4; Platelet Count 192; Potassium 3.9; Sodium 140   Recent Lipid Panel    Component Value Date/Time   CHOL 158 10/15/2022 0900   TRIG 139 10/15/2022 0900   TRIG 52 01/13/2006 1012   HDL 49 10/15/2022 0900   CHOLHDL 3.2 10/15/2022 0900   CHOLHDL 3 04/01/2016 0952   VLDL 19.2 04/01/2016 0952   LDLCALC 85 10/15/2022 0900   LDLDIRECT 64 10/09/2021 1056   LDLDIRECT 174.3 06/04/2009 0000    Physical Exam:    VS:  BP 108/68   Pulse 84   Ht 5' (1.524 m)   Wt 134 lb 3.2 oz (60.9 kg)   SpO2 94%   BMI 26.21 kg/m     Wt Readings from Last 3 Encounters:  05/05/23 134 lb 3.2 oz (60.9 kg)  04/30/23 130 lb (59 kg)  04/08/23 132 lb 12.8 oz (60.2 kg)    GEN: Well nourished, well developed in no acute distress HEENT: Normal NECK: No JVD; No carotid bruits LYMPHATICS: No lymphadenopathy CARDIAC:RRR, no murmurs, rubs, gallops RESPIRATORY:  Clear to auscultation without rales, wheezing or rhonchi  ABDOMEN: Soft, non-tender, non-distended MUSCULOSKELETAL:  No edema; No deformity  SKIN: Warm and dry NEUROLOGIC:  Alert and oriented x 3 PSYCHIATRIC:  Normal affect  ASSESSMENT:    1. Chronic diastolic heart failure (HCC)   2. Nonrheumatic aortic valve insufficiency   3. Essential hypertension   4. Permanent atrial fibrillation (HCC)   5. Pulmonary HTN (HCC)   6. Nonrheumatic  mitral valve regurgitation   7. OSA (obstructive sleep apnea)   8. SOB (shortness of breath)     PLAN:    In order of problems listed above:  1.  Chronic diastolic CHF  -She does not appear volume overloaded on exam today -I have personally reviewed and interpreted outside labs performed by patient's PCP which showed SCr 0.88, K+ 3.9 on 04/30/2023 -Continue prescription drug management with Lasix 20 mg twice daily and Lopressor 50 mg twice daily with as needed refills  2.  Aortic insufficiency -mild AI by echo 11/2020 -Moderate by echo 12/2021 -Mild by echo 03/22/2023  3.  HTN  -BP controlled on exam today -Continue prescription drug management with Lopressor 50 mg twice daily  4.  Permanent atrial fibrillation  -she did not hold in NSR after DCCV due to severely dilated LA -Her LA is severely dilated so not a candidate for ablation.   -She did not tolerate amio due to prolonged QT so likely would happen with other Class III AAD.   -Remains in atrial fibrillation on exam  with good HR control -Denies bleeding problems on DOAC -Continue prescription drug management with apixaban 5 mg twice daily, Lopressor 50 mg twice daily , Lanoxin 0.125mg  daily with PRN refills -I have personally reviewed and interpreted outside labs performed by patient's PCP which showed SCr. 0.88, Hbg 12.4 on 04/30/2023 -check dig level  5.  Pulmonary HTN  - RHC with no significant pulmonary HTN with mean PAP and PCWP elevated at in the setting of afib.   -2D echo 12/2019 with no PHTN but showed mildly reduced RV function and mild right ventricular enlargement as well as moderate pulmonary hypertension moderate to severe MR as well as TR. PASP 46.9 mmHg -2D echo 01/19/2022 showed PASP 31 mmHg with mild RV dysfunction -2D echo 03/26/2023 showed moderate pulmonary hypertension PASP 44 mmHg with normal RV size and systolic function  -Repeat 2D echo 03/21/2024 -Continue diuretic therapy  6.  Mitral  Regurgitation -mild to moderate by echo 12/2019 and moderate to severe by echo 11/2020 -TEE confirmed moderate to severe mitral regurgitation but EF appeared to be slightly reduced at 50 to 55%. -She was evaluated by Dr. Excell [name] in structural heart clinic and felt to have a combination of atrial functional MR with moderate left atrial enlargement as well as degenerative mitral regurgitation with severe calcification of the P2 scallop of the posterior leaflet.  Medical therapy ongoing was recommended given her low NYHA functional class I-II symptoms with fatigue. -She was not felt to be a candidate for transcatheter therapies such as edge-to-edge repair of the mitral valve due to posterior leaflet calcification that was severe.  Only options if needed would be clinical trial devices or cardiac surgical repair or replacement. -2D echo 03/26/2023 showed moderate mitral regurgitation and moderate TR -Repeat echo in 1 year  7.  OSA - The patient is tolerating PAP therapy well without any problems. The PAP download performed by his DME was personally reviewed and interpreted by me today and showed an AHI of 2.9/hr on auto CPAP from 4 to 20 cm H2O with 90% compliance in using more than 4 hours nightly.  The patient has been using and benefiting from PAP use and will continue to benefit from therapy.    8.  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  Concerns regarding medicines are outlined above.  No orders  of the defined types were placed in this encounter.   No orders of the defined types were placed in this encounter.    Signed, Patricia Magic, MD  05/05/2023 3:22 PM     [facility]

## 2023-05-05 ENCOUNTER — Ambulatory Visit: Payer: Medicare Other | Attending: Cardiology | Admitting: Cardiology

## 2023-05-05 ENCOUNTER — Encounter: Payer: Self-pay | Admitting: Cardiology

## 2023-05-05 ENCOUNTER — Other Ambulatory Visit: Payer: Self-pay

## 2023-05-05 ENCOUNTER — Other Ambulatory Visit: Payer: Self-pay | Admitting: Hematology & Oncology

## 2023-05-05 VITALS — BP 108/68 | HR 84 | Ht 60.0 in | Wt 134.2 lb

## 2023-05-05 DIAGNOSIS — I351 Nonrheumatic aortic (valve) insufficiency: Secondary | ICD-10-CM | POA: Diagnosis not present

## 2023-05-05 DIAGNOSIS — R0602 Shortness of breath: Secondary | ICD-10-CM | POA: Insufficient documentation

## 2023-05-05 DIAGNOSIS — I10 Essential (primary) hypertension: Secondary | ICD-10-CM | POA: Diagnosis not present

## 2023-05-05 DIAGNOSIS — I272 Pulmonary hypertension, unspecified: Secondary | ICD-10-CM | POA: Insufficient documentation

## 2023-05-05 DIAGNOSIS — I4821 Permanent atrial fibrillation: Secondary | ICD-10-CM | POA: Insufficient documentation

## 2023-05-05 DIAGNOSIS — I340 Nonrheumatic mitral (valve) insufficiency: Secondary | ICD-10-CM | POA: Diagnosis not present

## 2023-05-05 DIAGNOSIS — C50412 Malignant neoplasm of upper-outer quadrant of left female breast: Secondary | ICD-10-CM

## 2023-05-05 DIAGNOSIS — G4733 Obstructive sleep apnea (adult) (pediatric): Secondary | ICD-10-CM | POA: Insufficient documentation

## 2023-05-05 DIAGNOSIS — I5032 Chronic diastolic (congestive) heart failure: Secondary | ICD-10-CM | POA: Insufficient documentation

## 2023-05-05 NOTE — Addendum Note (Signed)
 Addended by: Macie Burows on: 05/05/2023 03:34 PM   Modules accepted: Orders

## 2023-05-05 NOTE — Patient Instructions (Signed)
 Medication Instructions:  Your physician recommends that you continue on your current medications as directed. Please refer to the Current Medication list given to you today.  *If you need a refill on your cardiac medications before your next appointment, please call your pharmacy*   Lab Work: Digoxin level  If you have labs (blood work) drawn today and your tests are completely normal, you will receive your results only by: MyChart Message (if you have MyChart) OR A paper copy in the mail If you have any lab test that is abnormal or we need to change your treatment, we will call you to review the results.   Testing/Procedures: FEB 2026-- - Your physician has requested that you have an echocardiogram. Echocardiography is a painless test that uses sound waves to create images of your heart. It provides your doctor with information about the size and shape of your heart and how well your heart's chambers and valves are working. This procedure takes approximately one hour. There are no restrictions for this procedure. Please do NOT wear cologne, perfume, aftershave, or lotions (deodorant is allowed). Please arrive 15 minutes prior to your appointment time.  Please note: We ask at that you not bring children with you during ultrasound (echo/ vascular) testing. Due to room size and safety concerns, children are not allowed in the ultrasound rooms during exams. Our front office staff cannot provide observation of children in our lobby area while testing is being conducted. An adult accompanying a patient to their appointment will only be allowed in the ultrasound room at the discretion of the ultrasound technician under special circumstances. We apologize for any inconvenience.    Follow-Up: At [facility], you and your health needs are our priority.  As part of our continuing mission to provide you with exceptional heart care, we have created designated Provider Care Teams.  These Care  Teams include your primary Cardiologist (physician) and Advanced Practice Providers (APPs -  Physician Assistants and Nurse Practitioners) who all work together to provide you with the care you need, when you need it.    Your next appointment:   1 year(s)  Provider:   Armanda Magic, MD

## 2023-05-06 ENCOUNTER — Telehealth: Payer: Self-pay

## 2023-05-06 ENCOUNTER — Other Ambulatory Visit: Payer: Self-pay

## 2023-05-06 ENCOUNTER — Other Ambulatory Visit: Payer: Self-pay | Admitting: Physician Assistant

## 2023-05-06 LAB — DIGOXIN LEVEL: Digoxin, Serum: 1.1 ng/mL — ABNORMAL HIGH (ref 0.5–0.9)

## 2023-05-06 NOTE — Telephone Encounter (Signed)
-----   Message from Josph Macho sent at 05/05/2023  5:22 PM EST ----- Please call and let her know that the PET scan does not show any obvious recurrence or progression of cancer.  However, there is a relatively large fluid collection around the left lung.  If I need to have this taken off.

## 2023-05-06 NOTE — Telephone Encounter (Signed)
 Advised via MyChart.

## 2023-05-07 ENCOUNTER — Other Ambulatory Visit: Payer: Self-pay

## 2023-05-07 ENCOUNTER — Encounter: Payer: Self-pay | Admitting: Hematology & Oncology

## 2023-05-07 DIAGNOSIS — Z170 Estrogen receptor positive status [ER+]: Secondary | ICD-10-CM

## 2023-05-07 NOTE — Telephone Encounter (Signed)
 Spoke with husband (ok per DPR) and advised him that Dr Myna Hidalgo wanted pt's thoracentesis moved up to Monday, 05/10/23. Reschedule apt for 05/10/23 @ 10:00 AM at [facility] radiology dept. Also advised husband, that Dr Myna Hidalgo said impression #4 is being evaluated, but that he doubts its cancerous. Husband stated understanding.

## 2023-05-10 ENCOUNTER — Ambulatory Visit (HOSPITAL_COMMUNITY)
Admission: RE | Admit: 2023-05-10 | Discharge: 2023-05-10 | Disposition: A | Discharge status: Home / Self Care | Source: Ambulatory Visit | Attending: Hematology & Oncology | Admitting: Hematology & Oncology

## 2023-05-10 ENCOUNTER — Ambulatory Visit (HOSPITAL_COMMUNITY)
Admission: RE | Admit: 2023-05-10 | Discharge: 2023-05-10 | Disposition: A | Discharge status: Home / Self Care | Source: Ambulatory Visit | Attending: Student | Admitting: Student

## 2023-05-10 DIAGNOSIS — Z170 Estrogen receptor positive status [ER+]: Secondary | ICD-10-CM | POA: Insufficient documentation

## 2023-05-10 DIAGNOSIS — R918 Other nonspecific abnormal finding of lung field: Secondary | ICD-10-CM | POA: Diagnosis not present

## 2023-05-10 DIAGNOSIS — J90 Pleural effusion, not elsewhere classified: Secondary | ICD-10-CM | POA: Diagnosis not present

## 2023-05-10 DIAGNOSIS — J948 Other specified pleural conditions: Secondary | ICD-10-CM | POA: Diagnosis not present

## 2023-05-10 DIAGNOSIS — C50412 Malignant neoplasm of upper-outer quadrant of left female breast: Secondary | ICD-10-CM | POA: Insufficient documentation

## 2023-05-10 DIAGNOSIS — Z48813 Encounter for surgical aftercare following surgery on the respiratory system: Secondary | ICD-10-CM | POA: Diagnosis not present

## 2023-05-10 HISTORY — PX: IR THORACENTESIS ASP PLEURAL SPACE W/IMG GUIDE: IMG5380

## 2023-05-10 MED ORDER — LIDOCAINE HCL 1 % IJ SOLN
INTRAMUSCULAR | Status: AC
  Filled 2023-05-10: qty 20

## 2023-05-10 MED ORDER — LIDOCAINE HCL 1 % IJ SOLN
10.0000 mL | Freq: Once | INTRAMUSCULAR | Status: AC
  Administered 2023-05-10: 8 mL via INTRADERMAL

## 2023-05-10 NOTE — Procedures (Signed)
 PROCEDURE SUMMARY:  Successful US guided left thoracentesis. Yielded 700 ml of clear yellow fluid. Pt tolerated procedure well. No immediate complications.  Specimen not sent for labs. CXR ordered; no post-procedure pneumothorax identified.   EBL < 2 mL  Mickie Kay, NP 05/10/2023 10:28 AM

## 2023-05-11 ENCOUNTER — Ambulatory Visit: Payer: Medicare Other | Admitting: Internal Medicine

## 2023-05-11 ENCOUNTER — Encounter: Payer: Self-pay | Admitting: Internal Medicine

## 2023-05-11 VITALS — BP 124/60 | HR 69 | Temp 98.5°F | Ht 60.0 in | Wt 130.6 lb

## 2023-05-11 DIAGNOSIS — C50919 Malignant neoplasm of unspecified site of unspecified female breast: Secondary | ICD-10-CM | POA: Diagnosis not present

## 2023-05-11 DIAGNOSIS — R053 Chronic cough: Secondary | ICD-10-CM

## 2023-05-11 DIAGNOSIS — J90 Pleural effusion, not elsewhere classified: Secondary | ICD-10-CM | POA: Diagnosis not present

## 2023-05-11 DIAGNOSIS — J4520 Mild intermittent asthma, uncomplicated: Secondary | ICD-10-CM

## 2023-05-11 DIAGNOSIS — J4540 Moderate persistent asthma, uncomplicated: Secondary | ICD-10-CM

## 2023-05-11 DIAGNOSIS — C7951 Secondary malignant neoplasm of bone: Secondary | ICD-10-CM

## 2023-05-11 DIAGNOSIS — I5032 Chronic diastolic (congestive) heart failure: Secondary | ICD-10-CM

## 2023-05-11 DIAGNOSIS — I480 Paroxysmal atrial fibrillation: Secondary | ICD-10-CM

## 2023-05-11 LAB — CYTOLOGY - NON PAP

## 2023-05-11 NOTE — Patient Instructions (Signed)
 It was a pleasure to see you today!  Please schedule follow up scheduled with myself in 1 year.  If my schedule is not open yet, we will contact you with a reminder closer to that time. Please call [phone] if you haven't heard from [name] a month before, and always call us sooner if issues or concerns arise. You can also send [name] a message through MyChart, but but aware that this is not to be used for urgent issues and it may take up to 5-7 days to receive a reply. Please be aware that you will likely be able to view your results before I have a chance to respond to them. Please give [name] 5 business days to respond to any non-urgent results.    Since asthma is doing well, you can drop down to 2 puffs advair once daily, gargle after use.  If breathing worsens or you need the albuterol inhaler more frequently, you can go back up to twice daily.  Will await the results of your pleural fluid studies.   Continue gabapentin for your cough.   Please call me sooner if issues with your breathing.

## 2023-05-11 NOTE — Progress Notes (Signed)
 Patricia [name]    [id number]    1940-07-03  Primary Care Physician:Edstrom, Simmie Davies, PA Date of Appointment: 05/11/2023 Established Patient Visit  Chief complaint:   Chief Complaint  Patient presents with   Follow-up    Patient states she doing well.     HPI: Patricia [name] is a 83 y.o. woman with history of atrial fibrillation, breast cancer, carcinoid tumor s/p resection of lingula. Also has metastatic breast cancer and has had radiation therapy on her vertebral bodies, hip  Interval Updates: Breathing is doing well. No interval exacerbations for asthma.   Using advair 2 puffs twice a day. Minimal albuterol use.   She has had recurrent left sided pleural effusion s/p thoracentesis in IR in August 2024 and had another one yesterday. Cytology is in process. Initial cytology was negative. Chemistry and cell count were not sent either time.   Currently on enhertu infusions for ER positive breast cancer with a solitary metastatic focus - bony metastases.   Still taking gabapentin for her chronic neurogenic cough with good effect.    Current Regimen: advair 230 2 puffs twice a day. Albuterol as needed Asthma Triggers: seasonal variation,  Exacerbations in the last year: none requiring  History of hospitalization or intubation: none Allergy Testing: yes, sensitive to molds, pollen (many years ago.) GERD: denies Allergic Rhinitis: yes on claritin ACT:  Asthma Control Test ACT Total Score  05/11/2023  2:19 PM 24   FeNO:   I have reviewed the patient's family social and past medical history and updated as appropriate.   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 Dx 04/2015 at time of stroke. b. DCCV 06/2015 - did not hold. Amio started then stopped due to QT prolongation.   Chronic  diastolic heart failure (HCC) 06/17/2015   CKD (chronic kidney disease), stage III (HCC)    Cough variant asthma    Dementia (HCC)    Diverticulosis    Essential hypertension    GERD (gastroesophageal reflux disease)    Glaucoma    Hiatal hernia    Hyperlipidemia    Hypothyroidism    Internal hemorrhoids    Laryngospasm    Mitral regurgitation    mild to moderate by echo 12/2019 and moderate by TEE 12/2020   Multinodular goiter    Osteopenia    Postmenopausal    Pulmonary HTN (HCC)     Moderate with PASP by echo 2018 likely Group 2 from pulmonary venous HTN from CHF>>normal PAP by echo 12/2019   Radiation 10/22/14-11/23/14   Left Breast   Stricture and stenosis of cervix    Stroke (HCC)    TIAs    Past Surgical History:  Procedure Laterality Date   BREAST LUMPECTOMY Left    BREAST SURGERY     CARDIOVERSION N/A 06/24/2015   Procedure: CARDIOVERSION;  Surgeon: Quintella Reichert, MD;  Location: [facility];  Service: Cardiovascular;  Laterality: N/A;   CARDIOVERSION N/A 05/19/2016   Procedure: CARDIOVERSION;  Surgeon: Quintella Reichert, MD;  Location: [facility];  Service: Cardiovascular;  Laterality: N/A;   COLONOSCOPY     EYE SURGERY Bilateral    cataract removal by Dr. Burgess Estelle   FEMUR IM NAIL Left 07/28/2022  Procedure: INTRAMEDULLARY (IM) NAIL FEMORAL;  Surgeon: Marcene Corning, MD;  Location: [facility];  Service: Orthopedics;  Laterality: Left;   IR IMAGING GUIDED PORT INSERTION  10/30/2022   IR THORACENTESIS ASP PLEURAL SPACE W/IMG GUIDE  05/10/2023   LUNG CANCER SURGERY  2003   resection carcinoid lingula-lt upper lobe   RADIOACTIVE SEED GUIDED PARTIAL MASTECTOMY WITH AXILLARY SENTINEL LYMPH NODE BIOPSY Left 06/26/2014   Procedure: RADIOACTIVE SEED GUIDED PARTIAL MASTECTOMY WITH AXILLARY SENTINEL LYMPH NODE BIOPSY;  Surgeon: Almond Lint, MD;  Location: [facility];  Service: General;  Laterality: Left;   RE-EXCISION OF BREAST LUMPECTOMY Left 08/07/2014    Procedure: RE-EXCISION OF LEFT BREAST LUMPECTOMY;  Surgeon: Almond Lint, MD;  Location: [facility];  Service: General;  Laterality: Left;   RIGHT HEART CATH N/A 04/27/2016   Procedure: Right Heart Cath;  Surgeon: Laurey Morale, MD;  Location[facility] INVASIVE CV LAB;  Service: Cardiovascular;  Laterality: N/A;   TEE WITHOUT CARDIOVERSION N/A 01/07/2021   Procedure: TRANSESOPHAGEAL ECHOCARDIOGRAM (TEE);  Surgeon: Vesta Mixer, MD;  Location: [facility];  Service: Cardiovascular;  Laterality: N/A;    Family History  Problem Relation Age of Onset   Stroke Mother    Hypertension Mother    Hyperlipidemia Mother    Stroke Father    Transient ischemic attack Father    Hyperlipidemia Father    Hypertension Father    Atrial fibrillation Son    Heart attack Neg Hx     Social History   Occupational History   Occupation: Retired  Tobacco Use   Smoking status: Former    Current packs/day: 0.00    Average packs/day: 0.8 packs/day for 5.0 years (3.8 ttl pk-yrs)    Types: Cigarettes    Start date: 06/14/1961    Quit date: 06/15/1966    Years since quitting: 56.9    Passive exposure: Past   Smokeless tobacco: Never  Vaping Use   Vaping status: Never Used  Substance and Sexual Activity   Alcohol use: Not Currently    Comment: occasional wine   Drug use: No   Sexual activity: Not Currently    Birth control/protection: Post-menopausal     Physical Exam: Blood pressure 124/60, pulse 69, temperature 98.5 F (36.9 C), temperature source Oral, height 5' (1.524 m), weight 130 lb 9.6 oz (59.2 kg), SpO2 95%.  Gen:      No acute distress Lungs:    ctab no wheezes or crackles CV:         RRR no mrg   Data Reviewed: Imaging: I have personally reviewed the pet scan jan 2024 which shows no nodules or masses in the chest. Small left sided pleural effusion.   PFTs:     Latest Ref Rng & Units 12/21/2019   10:49 AM 09/17/2015    3:38 PM  PFT Results  FVC-Pre L 1.58  1.52    FVC-Predicted Pre % 76  71   FVC-Post L 1.62  1.45   FVC-Predicted Post % 78  68   Pre FEV1/FVC % % 83  85   Post FEV1/FCV % % 85  86   FEV1-Pre L 1.32  1.28   FEV1-Predicted Pre % 86  81   FEV1-Post L 1.38  1.25   DLCO uncorrected ml/min/mmHg 9.51  11.72   DLCO UNC% % 60  72   DLCO corrected ml/min/mmHg  11.32   DLCO COR %Predicted %  69   DLVA Predicted % 95  97  TLC L 2.63  3.48   TLC % Predicted % 61  83   RV % Predicted % 40  84    I have personally reviewed the patient's PFTs and no airflow limitation.   Labs: Pathology reviewed  Immunization status: Immunization History  Administered Date(s) Administered   Hepatitis A, Adult 12/29/2012   Influenza Split 12/11/2010, 12/01/2011   Influenza Whole 01/21/2007, 01/01/2009, 11/28/2009   Influenza, High Dose Seasonal PF 12/01/2013, 12/14/2014, 12/02/2015, 12/18/2016, 12/14/2017, 11/12/2020, 12/12/2021   Influenza,inj,Quad PF,6+ Mos 12/29/2012, 12/01/2013   Influenza-Unspecified 12/18/2016, 12/14/2019, 12/17/2022   PFIZER(Purple Top)SARS-COV-2 Vaccination 03/14/2019, 04/03/2019, 11/27/2019   Pfizer Covid-19 Vaccine Bivalent Booster 12yrs & up 11/12/2020   Pfizer(Comirnaty)Fall Seasonal Vaccine 12 years and older 05/31/2020, 12/02/2021, 12/17/2022   Pneumococcal Conjugate-13 02/08/2014   Pneumococcal Polysaccharide-23 03/02/2005, 01/26/2011   Rsv, Bivalent, Protein Subunit Rsvpref,pf Verdis Frederickson) 12/15/2021   Td 03/02/2000, 12/29/2012   Typhoid Inactivated 10/01/2015   Yellow Fever 10/01/2015   Zoster Recombinant(Shingrix) 12/16/2021, 03/19/2022    External Records Personally Reviewed: oncology, pcp, pulmonary  Assessment:  Ailani Governale is a 83 y.o. woman with metastatic breast cancer, carcinoid tumor s/p lingular resection who presents with:   Moderate persistent asthma, well controlled Chronic cough, neurogenic component Recurrent left sided pleural effusion Metastatic breast cancer with mets to bone Chronic  hfpef Paroxysmal a. fib  Plan/Recommendations:  Since asthma is doing well, you can drop down to 2 puffs advair once daily, gargle after use.  If breathing worsens or you need the albuterol inhaler more frequently, you can go back up to twice daily.  Will await the results of your pleural fluid studies.   Continue gabapentin for your cough.   Please call me sooner if issues with your breathing.   If the pleural effusion is recurrent please send for body fluid cell count, chemistry including protein, glucose and LDH, and cytology. So far cytology is negative x1 and repeat is pending. If negative x3 I feel comfortable excluding malignancy.   Return to Care: Return in about 1 year (around 05/10/2024).   Durel Salts, MD Pulmonary and Critical Care Medicine [facility] [phone]

## 2023-05-12 ENCOUNTER — Ambulatory Visit (HOSPITAL_COMMUNITY)

## 2023-05-12 ENCOUNTER — Encounter: Payer: Self-pay | Admitting: *Deleted

## 2023-05-21 ENCOUNTER — Inpatient Hospital Stay

## 2023-05-21 ENCOUNTER — Inpatient Hospital Stay: Attending: Hematology & Oncology

## 2023-05-21 ENCOUNTER — Encounter: Payer: Self-pay | Admitting: Family

## 2023-05-21 ENCOUNTER — Inpatient Hospital Stay: Admitting: Family

## 2023-05-21 ENCOUNTER — Encounter: Payer: Self-pay | Admitting: Hematology & Oncology

## 2023-05-21 VITALS — BP 112/99 | HR 80 | Temp 97.8°F | Resp 18 | Wt 131.4 lb

## 2023-05-21 DIAGNOSIS — R7303 Prediabetes: Secondary | ICD-10-CM

## 2023-05-21 DIAGNOSIS — Z5112 Encounter for antineoplastic immunotherapy: Secondary | ICD-10-CM | POA: Diagnosis not present

## 2023-05-21 DIAGNOSIS — R4182 Altered mental status, unspecified: Secondary | ICD-10-CM

## 2023-05-21 DIAGNOSIS — Z853 Personal history of malignant neoplasm of breast: Secondary | ICD-10-CM | POA: Diagnosis not present

## 2023-05-21 DIAGNOSIS — Z124 Encounter for screening for malignant neoplasm of cervix: Secondary | ICD-10-CM

## 2023-05-21 DIAGNOSIS — C50412 Malignant neoplasm of upper-outer quadrant of left female breast: Secondary | ICD-10-CM | POA: Diagnosis not present

## 2023-05-21 DIAGNOSIS — M1990 Unspecified osteoarthritis, unspecified site: Secondary | ICD-10-CM

## 2023-05-21 DIAGNOSIS — D229 Melanocytic nevi, unspecified: Secondary | ICD-10-CM | POA: Diagnosis not present

## 2023-05-21 DIAGNOSIS — G40909 Epilepsy, unspecified, not intractable, without status epilepticus: Secondary | ICD-10-CM

## 2023-05-21 DIAGNOSIS — I6389 Other cerebral infarction: Secondary | ICD-10-CM

## 2023-05-21 DIAGNOSIS — M810 Age-related osteoporosis without current pathological fracture: Secondary | ICD-10-CM

## 2023-05-21 DIAGNOSIS — I340 Nonrheumatic mitral (valve) insufficiency: Secondary | ICD-10-CM

## 2023-05-21 DIAGNOSIS — I639 Cerebral infarction, unspecified: Secondary | ICD-10-CM

## 2023-05-21 DIAGNOSIS — I10 Essential (primary) hypertension: Secondary | ICD-10-CM

## 2023-05-21 DIAGNOSIS — E032 Hypothyroidism due to medicaments and other exogenous substances: Secondary | ICD-10-CM

## 2023-05-21 DIAGNOSIS — K5730 Diverticulosis of large intestine without perforation or abscess without bleeding: Secondary | ICD-10-CM

## 2023-05-21 DIAGNOSIS — R8271 Bacteriuria: Secondary | ICD-10-CM

## 2023-05-21 DIAGNOSIS — D72829 Elevated white blood cell count, unspecified: Secondary | ICD-10-CM

## 2023-05-21 DIAGNOSIS — Z171 Estrogen receptor negative status [ER-]: Secondary | ICD-10-CM | POA: Insufficient documentation

## 2023-05-21 DIAGNOSIS — H401122 Primary open-angle glaucoma, left eye, moderate stage: Secondary | ICD-10-CM

## 2023-05-21 DIAGNOSIS — Z79899 Other long term (current) drug therapy: Secondary | ICD-10-CM

## 2023-05-21 DIAGNOSIS — I5032 Chronic diastolic (congestive) heart failure: Secondary | ICD-10-CM

## 2023-05-21 DIAGNOSIS — M8580 Other specified disorders of bone density and structure, unspecified site: Secondary | ICD-10-CM

## 2023-05-21 DIAGNOSIS — Z0000 Encounter for general adult medical examination without abnormal findings: Secondary | ICD-10-CM

## 2023-05-21 DIAGNOSIS — C3410 Malignant neoplasm of upper lobe, unspecified bronchus or lung: Secondary | ICD-10-CM

## 2023-05-21 DIAGNOSIS — C7951 Secondary malignant neoplasm of bone: Secondary | ICD-10-CM | POA: Diagnosis not present

## 2023-05-21 DIAGNOSIS — E559 Vitamin D deficiency, unspecified: Secondary | ICD-10-CM

## 2023-05-21 DIAGNOSIS — K222 Esophageal obstruction: Secondary | ICD-10-CM

## 2023-05-21 DIAGNOSIS — J90 Pleural effusion, not elsewhere classified: Secondary | ICD-10-CM

## 2023-05-21 DIAGNOSIS — R053 Chronic cough: Secondary | ICD-10-CM

## 2023-05-21 DIAGNOSIS — I5033 Acute on chronic diastolic (congestive) heart failure: Secondary | ICD-10-CM

## 2023-05-21 DIAGNOSIS — R942 Abnormal results of pulmonary function studies: Secondary | ICD-10-CM

## 2023-05-21 DIAGNOSIS — I4891 Unspecified atrial fibrillation: Secondary | ICD-10-CM

## 2023-05-21 DIAGNOSIS — J45991 Cough variant asthma: Secondary | ICD-10-CM

## 2023-05-21 DIAGNOSIS — J810 Acute pulmonary edema: Secondary | ICD-10-CM

## 2023-05-21 DIAGNOSIS — I272 Pulmonary hypertension, unspecified: Secondary | ICD-10-CM

## 2023-05-21 DIAGNOSIS — N951 Menopausal and female climacteric states: Secondary | ICD-10-CM

## 2023-05-21 DIAGNOSIS — K219 Gastro-esophageal reflux disease without esophagitis: Secondary | ICD-10-CM

## 2023-05-21 DIAGNOSIS — Z170 Estrogen receptor positive status [ER+]: Secondary | ICD-10-CM

## 2023-05-21 DIAGNOSIS — J385 Laryngeal spasm: Secondary | ICD-10-CM

## 2023-05-21 DIAGNOSIS — R413 Other amnesia: Secondary | ICD-10-CM

## 2023-05-21 DIAGNOSIS — I517 Cardiomegaly: Secondary | ICD-10-CM

## 2023-05-21 DIAGNOSIS — E042 Nontoxic multinodular goiter: Secondary | ICD-10-CM

## 2023-05-21 DIAGNOSIS — H401111 Primary open-angle glaucoma, right eye, mild stage: Secondary | ICD-10-CM

## 2023-05-21 DIAGNOSIS — I4819 Other persistent atrial fibrillation: Secondary | ICD-10-CM

## 2023-05-21 DIAGNOSIS — Z8673 Personal history of transient ischemic attack (TIA), and cerebral infarction without residual deficits: Secondary | ICD-10-CM

## 2023-05-21 DIAGNOSIS — M898X5 Other specified disorders of bone, thigh: Secondary | ICD-10-CM

## 2023-05-21 DIAGNOSIS — I351 Nonrheumatic aortic (valve) insufficiency: Secondary | ICD-10-CM

## 2023-05-21 DIAGNOSIS — Z85118 Personal history of other malignant neoplasm of bronchus and lung: Secondary | ICD-10-CM

## 2023-05-21 DIAGNOSIS — N1831 Chronic kidney disease, stage 3a: Secondary | ICD-10-CM

## 2023-05-21 LAB — CBC WITH DIFFERENTIAL (CANCER CENTER ONLY)
Abs Immature Granulocytes: 0.1 K/uL — ABNORMAL HIGH (ref 0.00–0.07)
Basophils Absolute: 0 K/uL (ref 0.0–0.1)
Basophils Relative: 1 %
Eosinophils Absolute: 0 K/uL (ref 0.0–0.5)
Eosinophils Relative: 0 %
HCT: 36 % (ref 36.0–46.0)
Hemoglobin: 11.9 g/dL — ABNORMAL LOW (ref 12.0–15.0)
Immature Granulocytes: 2 %
Lymphocytes Relative: 16 %
Lymphs Abs: 0.8 K/uL (ref 0.7–4.0)
MCH: 33.7 pg (ref 26.0–34.0)
MCHC: 33.1 g/dL (ref 30.0–36.0)
MCV: 102 fL — ABNORMAL HIGH (ref 80.0–100.0)
Monocytes Absolute: 0.6 K/uL (ref 0.1–1.0)
Monocytes Relative: 14 %
Neutro Abs: 3.1 K/uL (ref 1.7–7.7)
Neutrophils Relative %: 67 %
Platelet Count: 150 K/uL (ref 150–400)
RBC: 3.53 MIL/uL — ABNORMAL LOW (ref 3.87–5.11)
RDW: 18.2 % — ABNORMAL HIGH (ref 11.5–15.5)
WBC Count: 4.6 K/uL (ref 4.0–10.5)
nRBC: 0 % (ref 0.0–0.2)

## 2023-05-21 LAB — RETICULOCYTES
Immature Retic Fract: 20.7 % — ABNORMAL HIGH (ref 2.3–15.9)
RBC.: 3.55 MIL/uL — ABNORMAL LOW (ref 3.87–5.11)
Retic Count, Absolute: 182.8 10*3/uL (ref 19.0–186.0)
Retic Ct Pct: 5.2 % — ABNORMAL HIGH (ref 0.4–3.1)

## 2023-05-21 LAB — CMP (CANCER CENTER ONLY)
ALT: 22 U/L (ref 0–44)
AST: 29 U/L (ref 15–41)
Albumin: 3.6 g/dL (ref 3.5–5.0)
Alkaline Phosphatase: 69 U/L (ref 38–126)
Anion gap: 8 (ref 5–15)
BUN: 19 mg/dL (ref 8–23)
CO2: 27 mmol/L (ref 22–32)
Calcium: 8.4 mg/dL — ABNORMAL LOW (ref 8.9–10.3)
Chloride: 105 mmol/L (ref 98–111)
Creatinine: 0.77 mg/dL (ref 0.44–1.00)
GFR, Estimated: >60 mL/min (ref >60)
Glucose, Bld: 114 mg/dL — ABNORMAL HIGH (ref 70–99)
Potassium: 3.8 mmol/L (ref 3.5–5.1)
Sodium: 140 mmol/L (ref 135–145)
Total Bilirubin: 0.5 mg/dL (ref 0.0–1.2)
Total Protein: 5.8 g/dL — ABNORMAL LOW (ref 6.5–8.1)

## 2023-05-21 LAB — FERRITIN: Ferritin: 121 ng/mL (ref 11–307)

## 2023-05-21 LAB — IRON AND IRON BINDING CAPACITY (CC-WL,HP ONLY)
Iron: 80 ug/dL (ref 28–170)
Saturation Ratios: 27 % (ref 10.4–31.8)
TIBC: 295 ug/dL (ref 250–450)
UIBC: 215 ug/dL (ref 148–442)

## 2023-05-21 MED ORDER — ACETAMINOPHEN 325 MG PO TABS
650.0000 mg | ORAL_TABLET | Freq: Once | ORAL | Status: AC
  Administered 2023-05-21: 650 mg via ORAL
  Filled 2023-05-21: qty 2

## 2023-05-21 MED ORDER — DIPHENHYDRAMINE HCL 25 MG PO CAPS
25.0000 mg | ORAL_CAPSULE | Freq: Once | ORAL | Status: AC
  Administered 2023-05-21: 25 mg via ORAL
  Filled 2023-05-21: qty 1

## 2023-05-21 MED ORDER — SODIUM CHLORIDE 0.9% FLUSH
10.0000 mL | INTRAVENOUS | Status: DC | PRN
  Administered 2023-05-21: 10 mL

## 2023-05-21 MED ORDER — PALONOSETRON HCL INJECTION 0.25 MG/5ML
0.2500 mg | Freq: Once | INTRAVENOUS | Status: AC
Start: 2023-05-21 — End: 2023-05-21
  Administered 2023-05-21: 0.25 mg via INTRAVENOUS
  Filled 2023-05-21: qty 5

## 2023-05-21 MED ORDER — HEPARIN SOD (PORK) LOCK FLUSH 100 UNIT/ML IV SOLN
500.0000 [IU] | Freq: Once | INTRAVENOUS | Status: AC | PRN
  Administered 2023-05-21: 500 [IU]

## 2023-05-21 MED ORDER — DEXAMETHASONE SODIUM PHOSPHATE 10 MG/ML IJ SOLN
10.0000 mg | Freq: Once | INTRAMUSCULAR | Status: AC
  Administered 2023-05-21: 10 mg via INTRAVENOUS
  Filled 2023-05-21: qty 1

## 2023-05-21 MED ORDER — DENOSUMAB 120 MG/1.7ML ~~LOC~~ SOLN
120.0000 mg | Freq: Once | SUBCUTANEOUS | Status: AC
  Administered 2023-05-21: 120 mg via SUBCUTANEOUS
  Filled 2023-05-21: qty 1.7

## 2023-05-21 MED ORDER — DEXTROSE 5 % IV SOLN: Freq: Once | INTRAVENOUS | Status: AC

## 2023-05-21 MED ORDER — FAM-TRASTUZUMAB DERUXTECAN-NXKI CHEMO 100 MG IV SOLR
300.0000 mg | Freq: Once | INTRAVENOUS | Status: AC
  Administered 2023-05-21: 300 mg via INTRAVENOUS
  Filled 2023-05-21: qty 15

## 2023-05-21 MED ORDER — SODIUM CHLORIDE 0.9 % IV SOLN
150.0000 mg | Freq: Once | INTRAVENOUS | Status: AC
  Administered 2023-05-21: 150 mg via INTRAVENOUS
  Filled 2023-05-21: qty 150

## 2023-05-21 NOTE — Patient Instructions (Signed)

## 2023-05-21 NOTE — Progress Notes (Signed)
 Hematology and Oncology Follow Up Visit  Patricia [name] [id number] 04-Jan-1941 83 y.o. 05/21/2023   Principle Diagnosis:  Metastatic adenocarcinoma of the breast-L5 metastasis -- ER-/PR-/HER2 equivocal   Current Therapy:        S/p Radiation therapy to the 9th LEFT rib/L5 vertebral body-start on 07/07/2021 SBRT to the left hip-5 fractions finished on 11/28/2021 Enhertu 5.4 mg/kg - s/p cycle #10 - start on 07/16/2022 Xgeva 120 mg IM every 3 months-next dose 08/2023    Monitoring: Echo- 03/26/2023, 07/21/2022, 01/15/2022, 07/22/2021, 12/10/2020, 12/11/2019, 04/09/2016. Q4-6 months    Interim History:  Patricia [name] is here today with her husband for follow-up and treatment. She is doing well and has no complaints at this time.  Her husband who helps with her care has noted some new irregular moles on the left upper back and side. This are dark brown and irregular in shape ranging 3-4 mm in size. There are 3 new moles. No broken skin or bleeding noted. No redness or edema.   We will have dermatology take a look.  No changes to breast exam. No mass, lesion or rash. No lymphedema or adenopathy noted on exam.  No fever, chills, n/v, cough, rash, dizziness, SOB, chest pain, palpitations, abdominal pain or changes in bowel or bladder habits.  Minimal puffiness in her feet. She takes Lasix PO BID to help reduce.  She is on Eliquis for atrial fib.  No blood loss noted. No bruising or petechiae.  No numbness or tingling in her extremities.  No falls or syncope reported. She ambulates with a can for added support.  Appetite and hydration are described as good. Weight is stable at 131 lbs.   ECOG Performance Status: 1 - Symptomatic but completely ambulatory  Medications:  Allergies as of 05/21/2023       Reactions   Combigan [brimonidine Tartrate-timolol] Itching   Per patient made eyes red, sore, and sensitivity to light   Sulfa Antibiotics Hives, Rash   Sulfamethoxazole-trimethoprim Hives         Medication List        Accurate as of May 21, 2023 10:02 AM. If you have any questions, ask your nurse or doctor.          albuterol 108 (90 Base) MCG/ACT inhaler Commonly known as: VENTOLIN HFA Inhale 2 puffs into the lungs every 6 (six) hours as needed for wheezing or shortness of breath.   apixaban 5 MG Tabs tablet Commonly known as: Eliquis Take 1 tablet (5 mg total) by mouth 2 (two) times daily.   atorvastatin 40 MG tablet Commonly known as: LIPITOR TAKE 1 TABLET DAILY BEFORE SUPPER   bimatoprost 0.01 % Soln Commonly known as: LUMIGAN Place 1 drop into both eyes 2 (two) times daily.   calcium-vitamin D 500-200 MG-UNIT tablet Commonly known as: OSCAL WITH D Take 1 tablet by mouth daily with breakfast.   Cerefolin 07-31-48-5 MG Tabs Take 1 tablet by mouth every morning.   dexamethasone 4 MG tablet Commonly known as: DECADRON Take 2 tablets (8 mg) by mouth daily for 3 days starting the day after chemotherapy. Take with food.   digoxin 0.125 MG tablet Commonly known as: LANOXIN Take 1 tablet (0.125 mg total) by mouth daily.   donepezil 5 MG tablet Commonly known as: ARICEPT Take 1 tablet (5 mg total) by mouth at bedtime.   dorzolamide-timolol 2-0.5 % ophthalmic solution Commonly known as: COSOPT Place 1 drop into both eyes at bedtime.   fluticasone 50 MCG/ACT nasal spray  Commonly known as: FLONASE 1 spray each nostril following sinus rinses twice daily   fluticasone-salmeterol 230-21 MCG/ACT inhaler Commonly known as: Advair HFA USE 2 INHALATIONS TWICE A DAY   folic acid 400 MCG tablet Commonly known as: FOLVITE Take 400 mcg by mouth daily.   furosemide 20 MG tablet Commonly known as: LASIX TAKE 1 TABLET TWICE A DAY. TAKE ADDITIONAL 1 TABLET AS NEEDED FOR SWELLING   gabapentin 400 MG capsule Commonly known as: NEURONTIN TAKE 1 CAPSULE TWICE A DAY   levETIRAcetam 500 MG 24 hr tablet Commonly known as: KEPPRA XR TAKE 1 TABLET AT BEDTIME    lidocaine-prilocaine cream Commonly known as: EMLA Apply a dime size of cream to port-a-cath 1-2 hours prior to access. Cover with [employer].   loratadine 10 MG tablet Commonly known as: CLARITIN Take 10 mg by mouth daily.   memantine 10 MG tablet Commonly known as: NAMENDA TAKE 1 TABLET TWICE A DAY   metoprolol tartrate 50 MG tablet Commonly known as: LOPRESSOR Take 1 tablet (50 mg total) by mouth 2 (two) times daily.   Multivitamin/Iron Tabs Take 1 tablet by mouth daily.   ondansetron 8 MG tablet Commonly known as: Zofran Take 1 tablet (8 mg total) by mouth every 8 (eight) hours as needed for nausea or vomiting. Start on the third day after chemotherapy.   OVER THE COUNTER MEDICATION Take 1 tablet by mouth daily. Zyflamend otc herbal supplement   pantoprazole 40 MG tablet Commonly known as: PROTONIX TAKE 1 TABLET DAILY   potassium chloride SA 20 MEQ tablet Commonly known as: KLOR-CON M Take 1 tablet (20 mEq total) by mouth daily.   Prevagen 10 MG Caps Generic drug: Apoaequorin Take 1 capsule by mouth every morning.   prochlorperazine 10 MG tablet Commonly known as: COMPAZINE Take 1 tablet (10 mg total) by mouth every 6 (six) hours as needed for nausea or vomiting.   Rhopressa 0.02 % Soln Generic drug: Netarsudil Dimesylate Place 1 drop into both eyes at bedtime.   thyroid 60 MG tablet Commonly known as: Armour Thyroid Take 1 tablet (60 mg total) by mouth daily. Takes with 15mg  daily.   thyroid 15 MG tablet Commonly known as: Armour Thyroid Take 1 tablet (15 mg total) by mouth daily. Take with 60mg  for a total dose of 75mg  daily.   traMADol 50 MG tablet Commonly known as: ULTRAM Take 1 tablet (50 mg total) by mouth every 6 (six) hours as needed (post op pain).   Vitamin D3 50 MCG (2000 UT) capsule Take 2,000 Units by mouth daily.        Allergies:  Allergies  Allergen Reactions   Combigan [Brimonidine Tartrate-Timolol] Itching    Per patient  made eyes red, sore, and sensitivity to light   Sulfa Antibiotics Hives and Rash   Sulfamethoxazole-Trimethoprim Hives    Past Medical History, Surgical history, Social history, and Family History were reviewed and updated.  Review of Systems: All other 10 point review of systems is negative.   Physical Exam:  vitals were not taken for this visit.   Wt Readings from Last 3 Encounters:  05/11/23 130 lb 9.6 oz (59.2 kg)  05/05/23 134 lb 3.2 oz (60.9 kg)  04/30/23 130 lb (59 kg)    Ocular: Sclerae unicteric, pupils equal, round and reactive to light Ear-nose-throat: Oropharynx clear, dentition fair Lymphatic: No cervical, supraclavicular or axillary adenopathy Lungs no rales or rhonchi, good excursion bilaterally Heart regular rate and rhythm, no murmur appreciated Abd soft,  nontender, positive bowel sounds MSK no focal spinal tenderness, no joint edema Neuro: non-focal, well-oriented, appropriate affect Breasts: Same as above.   Lab Results  Component Value Date   WBC 5.6 04/30/2023   HGB 12.4 04/30/2023   HCT 37.3 04/30/2023   MCV 99.7 04/30/2023   PLT 192 04/30/2023   Lab Results  Component Value Date   FERRITIN 175 08/13/2022   IRON 73 08/13/2022   TIBC 255 08/13/2022   UIBC 182 08/13/2022   IRONPCTSAT 29 08/13/2022   Lab Results  Component Value Date   RETICCTPCT 8.4 (H) 08/13/2022   RBC 3.74 (L) 04/30/2023   Lab Results  Component Value Date   KPAFRELGTCHN 25.6 (H) 05/30/2021   LAMBDASER 15.6 05/30/2021   KAPLAMBRATIO 1.64 05/30/2021   Lab Results  Component Value Date   IGGSERUM 1,122 05/30/2021   IGA 184 05/30/2021   IGMSERUM 235 (H) 05/30/2021   Lab Results  Component Value Date   TOTALPROTELP 6.6 05/30/2021   ALBUMINELP 3.5 05/30/2021   A1GS 0.2 05/30/2021   A2GS 0.8 05/30/2021   BETS 0.9 05/30/2021   GAMS 1.2 05/30/2021   MSPIKE Not Observed 05/30/2021     Chemistry      Component Value Date/Time   NA 140 04/30/2023 1141   NA 142  12/24/2022 0925   NA 141 09/22/2016 1459   K 3.9 04/30/2023 1141   K 3.8 09/22/2016 1459   CL 104 04/30/2023 1141   CO2 28 04/30/2023 1141   CO2 26 09/22/2016 1459   BUN 20 04/30/2023 1141   BUN 35 (H) 12/24/2022 0925   BUN 19.8 09/22/2016 1459   CREATININE 0.88 04/30/2023 1141   CREATININE 1.1 09/22/2016 1459      Component Value Date/Time   CALCIUM 9.8 04/30/2023 1141   CALCIUM 9.2 09/22/2016 1459   ALKPHOS 79 04/30/2023 1141   ALKPHOS 98 09/22/2016 1459   AST 28 04/30/2023 1141   AST 15 09/22/2016 1459   ALT 24 04/30/2023 1141   ALT 21 09/22/2016 1459   BILITOT 0.6 04/30/2023 1141   BILITOT 0.65 09/22/2016 1459       Impression and Plan: Ms. Sconyers is a very nice 83 yo postmenopausal caucasian female with a solitary metastatic focus from breast cancer.  Her initial breast cancer was probably 7 years ago and was estrogen positive. 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  The HER2 was equivocal on initial staining. CA 27.29 pending. Last result was 30.3.  She continues to do well on Enhertu without evidence of toxicity. Her echocardiogram has remained stable so far with enlarged atria and atrial fib.  We will proceed with treatment today as planned.  Calcium discussed with Misty Stanley in pharmacy. Corrected calcium is 8.7. Patient is taking her calcium supplement daily and will continue. We will give Xgeva today.  Dermatology referral placed with patient preferred MD, Dr. Karlyn Agee.  Follow-up in 3 weeks.   Eileen Stanford, NP 3/21/202510:02 AM

## 2023-05-21 NOTE — Patient Instructions (Signed)
 [facility] - A DEPT OF [facility]  Discharge Instructions: Thank you for choosing [facility] to provide your oncology and hematology care.   If you have a lab appointment with the Cancer Center, please go directly to the Cancer Center and check in at the registration area.  Wear comfortable clothing and clothing appropriate for easy access to any Portacath or PICC line.   We strive to give you quality time with your provider. You may need to reschedule your appointment if you arrive late (15 or more minutes).  Arriving late affects you and other patients whose appointments are after yours.  Also, if you miss three or more appointments without notifying the office, you may be dismissed from the clinic at the provider's discretion.      For prescription refill requests, have your pharmacy contact our office and allow 72 hours for refills to be completed.    Today you received the following chemotherapy and/or immunotherapy agents Enhertu      To help prevent nausea and vomiting after your treatment, we encourage you to take your nausea medication as directed.  BELOW ARE SYMPTOMS THAT SHOULD BE REPORTED IMMEDIATELY: *FEVER GREATER THAN 100.4 F (38 C) OR HIGHER *CHILLS OR SWEATING *NAUSEA AND VOMITING THAT IS NOT CONTROLLED WITH YOUR NAUSEA MEDICATION *UNUSUAL SHORTNESS OF BREATH *UNUSUAL BRUISING OR BLEEDING *URINARY PROBLEMS (pain or burning when urinating, or frequent urination) *BOWEL PROBLEMS (unusual diarrhea, constipation, pain near the anus) TENDERNESS IN MOUTH AND THROAT WITH OR WITHOUT PRESENCE OF ULCERS (sore throat, sores in mouth, or a toothache) UNUSUAL RASH, SWELLING OR PAIN  UNUSUAL VAGINAL DISCHARGE OR ITCHING   Items with * indicate a potential emergency and should be followed up as soon as possible or go to the Emergency Department if any problems should occur.  Please show the CHEMOTHERAPY ALERT CARD or IMMUNOTHERAPY  ALERT CARD at check-in to the Emergency Department and triage nurse. Should you have questions after your visit or need to cancel or reschedule your appointment, please contact [facility] - A DEPT OF [facility]  [phone] and follow the prompts.  Office hours are 8:00 a.m. to 4:30 p.m. Monday - Friday. Please note that voicemails left after 4:00 p.m. may not be returned until the following business day.  We are closed weekends and major holidays. You have access to a nurse at all times for urgent questions. Please call the main number to the clinic [phone] and follow the prompts.  For any non-urgent questions, you may also contact your provider using MyChart. We now offer e-Visits for anyone 48 and older to request care online for non-urgent symptoms. For details visit [email or web address].   Also download the MyChart app! Go to the app store, search "MyChart", open the app, select , and log in with your MyChart username and password.

## 2023-05-22 ENCOUNTER — Encounter: Payer: Self-pay | Admitting: Hematology & Oncology

## 2023-05-22 LAB — CANCER ANTIGEN 27.29: CA 27.29: 29 U/mL (ref 0.0–38.6)

## 2023-05-25 ENCOUNTER — Other Ambulatory Visit: Payer: Self-pay

## 2023-05-25 DIAGNOSIS — C7951 Secondary malignant neoplasm of bone: Secondary | ICD-10-CM

## 2023-05-29 ENCOUNTER — Ambulatory Visit (HOSPITAL_COMMUNITY)
Admission: RE | Admit: 2023-05-29 | Discharge: 2023-05-29 | Disposition: A | Discharge status: Home / Self Care | Source: Ambulatory Visit | Attending: Hematology & Oncology | Admitting: Hematology & Oncology

## 2023-05-29 DIAGNOSIS — M4187 Other forms of scoliosis, lumbosacral region: Secondary | ICD-10-CM | POA: Diagnosis not present

## 2023-05-29 DIAGNOSIS — M47812 Spondylosis without myelopathy or radiculopathy, cervical region: Secondary | ICD-10-CM | POA: Diagnosis not present

## 2023-05-29 DIAGNOSIS — C7951 Secondary malignant neoplasm of bone: Secondary | ICD-10-CM | POA: Diagnosis not present

## 2023-05-29 DIAGNOSIS — C50919 Malignant neoplasm of unspecified site of unspecified female breast: Secondary | ICD-10-CM | POA: Diagnosis not present

## 2023-05-29 DIAGNOSIS — M4804 Spinal stenosis, thoracic region: Secondary | ICD-10-CM | POA: Diagnosis not present

## 2023-05-29 MED ORDER — GADOBUTROL 1 MMOL/ML IV SOLN
5.0000 mL | Freq: Once | INTRAVENOUS | Status: AC | PRN
Start: 2023-05-29 — End: 2023-05-29
  Administered 2023-05-29: 5 mL via INTRAVENOUS

## 2023-05-31 ENCOUNTER — Other Ambulatory Visit: Payer: Self-pay | Admitting: Cardiology

## 2023-06-11 ENCOUNTER — Inpatient Hospital Stay: Attending: Hematology & Oncology

## 2023-06-11 ENCOUNTER — Inpatient Hospital Stay

## 2023-06-11 ENCOUNTER — Other Ambulatory Visit: Payer: Self-pay

## 2023-06-11 ENCOUNTER — Encounter: Payer: Self-pay | Admitting: Hematology & Oncology

## 2023-06-11 ENCOUNTER — Inpatient Hospital Stay: Admitting: Hematology & Oncology

## 2023-06-11 VITALS — BP 99/60 | HR 60

## 2023-06-11 VITALS — BP 110/79 | HR 80 | Temp 98.3°F | Resp 18 | Ht 61.0 in | Wt 130.0 lb

## 2023-06-11 DIAGNOSIS — I340 Nonrheumatic mitral (valve) insufficiency: Secondary | ICD-10-CM

## 2023-06-11 DIAGNOSIS — G40909 Epilepsy, unspecified, not intractable, without status epilepticus: Secondary | ICD-10-CM

## 2023-06-11 DIAGNOSIS — K219 Gastro-esophageal reflux disease without esophagitis: Secondary | ICD-10-CM

## 2023-06-11 DIAGNOSIS — C3410 Malignant neoplasm of upper lobe, unspecified bronchus or lung: Secondary | ICD-10-CM

## 2023-06-11 DIAGNOSIS — Z79899 Other long term (current) drug therapy: Secondary | ICD-10-CM

## 2023-06-11 DIAGNOSIS — I5032 Chronic diastolic (congestive) heart failure: Secondary | ICD-10-CM

## 2023-06-11 DIAGNOSIS — J90 Pleural effusion, not elsewhere classified: Secondary | ICD-10-CM

## 2023-06-11 DIAGNOSIS — I517 Cardiomegaly: Secondary | ICD-10-CM

## 2023-06-11 DIAGNOSIS — H401111 Primary open-angle glaucoma, right eye, mild stage: Secondary | ICD-10-CM

## 2023-06-11 DIAGNOSIS — I4891 Unspecified atrial fibrillation: Secondary | ICD-10-CM

## 2023-06-11 DIAGNOSIS — Z8673 Personal history of transient ischemic attack (TIA), and cerebral infarction without residual deficits: Secondary | ICD-10-CM

## 2023-06-11 DIAGNOSIS — I4819 Other persistent atrial fibrillation: Secondary | ICD-10-CM

## 2023-06-11 DIAGNOSIS — C50412 Malignant neoplasm of upper-outer quadrant of left female breast: Secondary | ICD-10-CM | POA: Diagnosis not present

## 2023-06-11 DIAGNOSIS — Z0000 Encounter for general adult medical examination without abnormal findings: Secondary | ICD-10-CM

## 2023-06-11 DIAGNOSIS — N951 Menopausal and female climacteric states: Secondary | ICD-10-CM

## 2023-06-11 DIAGNOSIS — R053 Chronic cough: Secondary | ICD-10-CM

## 2023-06-11 DIAGNOSIS — R8271 Bacteriuria: Secondary | ICD-10-CM

## 2023-06-11 DIAGNOSIS — I351 Nonrheumatic aortic (valve) insufficiency: Secondary | ICD-10-CM

## 2023-06-11 DIAGNOSIS — Z5112 Encounter for antineoplastic immunotherapy: Secondary | ICD-10-CM | POA: Insufficient documentation

## 2023-06-11 DIAGNOSIS — C7951 Secondary malignant neoplasm of bone: Secondary | ICD-10-CM

## 2023-06-11 DIAGNOSIS — J45991 Cough variant asthma: Secondary | ICD-10-CM

## 2023-06-11 DIAGNOSIS — Z170 Estrogen receptor positive status [ER+]: Secondary | ICD-10-CM

## 2023-06-11 DIAGNOSIS — R942 Abnormal results of pulmonary function studies: Secondary | ICD-10-CM

## 2023-06-11 DIAGNOSIS — I6389 Other cerebral infarction: Secondary | ICD-10-CM

## 2023-06-11 DIAGNOSIS — D229 Melanocytic nevi, unspecified: Secondary | ICD-10-CM

## 2023-06-11 DIAGNOSIS — R413 Other amnesia: Secondary | ICD-10-CM

## 2023-06-11 DIAGNOSIS — I272 Pulmonary hypertension, unspecified: Secondary | ICD-10-CM

## 2023-06-11 DIAGNOSIS — Z171 Estrogen receptor negative status [ER-]: Secondary | ICD-10-CM | POA: Diagnosis not present

## 2023-06-11 DIAGNOSIS — E032 Hypothyroidism due to medicaments and other exogenous substances: Secondary | ICD-10-CM

## 2023-06-11 DIAGNOSIS — K5730 Diverticulosis of large intestine without perforation or abscess without bleeding: Secondary | ICD-10-CM

## 2023-06-11 DIAGNOSIS — N1831 Chronic kidney disease, stage 3a: Secondary | ICD-10-CM

## 2023-06-11 DIAGNOSIS — M1990 Unspecified osteoarthritis, unspecified site: Secondary | ICD-10-CM

## 2023-06-11 DIAGNOSIS — Z124 Encounter for screening for malignant neoplasm of cervix: Secondary | ICD-10-CM

## 2023-06-11 DIAGNOSIS — I10 Essential (primary) hypertension: Secondary | ICD-10-CM

## 2023-06-11 DIAGNOSIS — Z1722 Progesterone receptor negative status: Secondary | ICD-10-CM | POA: Diagnosis not present

## 2023-06-11 DIAGNOSIS — K222 Esophageal obstruction: Secondary | ICD-10-CM

## 2023-06-11 DIAGNOSIS — E042 Nontoxic multinodular goiter: Secondary | ICD-10-CM

## 2023-06-11 DIAGNOSIS — H401122 Primary open-angle glaucoma, left eye, moderate stage: Secondary | ICD-10-CM

## 2023-06-11 DIAGNOSIS — M810 Age-related osteoporosis without current pathological fracture: Secondary | ICD-10-CM

## 2023-06-11 DIAGNOSIS — J810 Acute pulmonary edema: Secondary | ICD-10-CM

## 2023-06-11 DIAGNOSIS — R4182 Altered mental status, unspecified: Secondary | ICD-10-CM

## 2023-06-11 DIAGNOSIS — Z853 Personal history of malignant neoplasm of breast: Secondary | ICD-10-CM | POA: Diagnosis not present

## 2023-06-11 DIAGNOSIS — E559 Vitamin D deficiency, unspecified: Secondary | ICD-10-CM

## 2023-06-11 DIAGNOSIS — D72829 Elevated white blood cell count, unspecified: Secondary | ICD-10-CM

## 2023-06-11 DIAGNOSIS — I5033 Acute on chronic diastolic (congestive) heart failure: Secondary | ICD-10-CM

## 2023-06-11 DIAGNOSIS — Z85118 Personal history of other malignant neoplasm of bronchus and lung: Secondary | ICD-10-CM

## 2023-06-11 DIAGNOSIS — J385 Laryngeal spasm: Secondary | ICD-10-CM

## 2023-06-11 DIAGNOSIS — I639 Cerebral infarction, unspecified: Secondary | ICD-10-CM

## 2023-06-11 DIAGNOSIS — R7303 Prediabetes: Secondary | ICD-10-CM

## 2023-06-11 DIAGNOSIS — M898X5 Other specified disorders of bone, thigh: Secondary | ICD-10-CM

## 2023-06-11 DIAGNOSIS — M8580 Other specified disorders of bone density and structure, unspecified site: Secondary | ICD-10-CM

## 2023-06-11 LAB — CBC WITH DIFFERENTIAL (CANCER CENTER ONLY)
Abs Immature Granulocytes: 0.05 10*3/uL (ref 0.00–0.07)
Basophils Absolute: 0 10*3/uL (ref 0.0–0.1)
Basophils Relative: 0 %
Eosinophils Absolute: 0 10*3/uL (ref 0.0–0.5)
Eosinophils Relative: 0 %
HCT: 35.6 % — ABNORMAL LOW (ref 36.0–46.0)
Hemoglobin: 11.7 g/dL — ABNORMAL LOW (ref 12.0–15.0)
Immature Granulocytes: 1 %
Lymphocytes Relative: 13 %
Lymphs Abs: 0.6 10*3/uL — ABNORMAL LOW (ref 0.7–4.0)
MCH: 33.8 pg (ref 26.0–34.0)
MCHC: 32.9 g/dL (ref 30.0–36.0)
MCV: 102.9 fL — ABNORMAL HIGH (ref 80.0–100.0)
Monocytes Absolute: 0.7 10*3/uL (ref 0.1–1.0)
Monocytes Relative: 15 %
Neutro Abs: 3.2 10*3/uL (ref 1.7–7.7)
Neutrophils Relative %: 71 %
Platelet Count: 159 10*3/uL (ref 150–400)
RBC: 3.46 MIL/uL — ABNORMAL LOW (ref 3.87–5.11)
RDW: 17.4 % — ABNORMAL HIGH (ref 11.5–15.5)
WBC Count: 4.6 10*3/uL (ref 4.0–10.5)
nRBC: 0 % (ref 0.0–0.2)

## 2023-06-11 LAB — CMP (CANCER CENTER ONLY)
ALT: 23 U/L (ref 0–44)
AST: 28 U/L (ref 15–41)
Albumin: 3.6 g/dL (ref 3.5–5.0)
Alkaline Phosphatase: 75 U/L (ref 38–126)
Anion gap: 7 (ref 5–15)
BUN: 22 mg/dL (ref 8–23)
CO2: 27 mmol/L (ref 22–32)
Calcium: 9 mg/dL (ref 8.9–10.3)
Chloride: 105 mmol/L (ref 98–111)
Creatinine: 0.85 mg/dL (ref 0.44–1.00)
GFR, Estimated: >60 mL/min (ref >60)
Glucose, Bld: 120 mg/dL — ABNORMAL HIGH (ref 70–99)
Potassium: 4 mmol/L (ref 3.5–5.1)
Sodium: 139 mmol/L (ref 135–145)
Total Bilirubin: 0.6 mg/dL (ref 0.0–1.2)
Total Protein: 5.8 g/dL — ABNORMAL LOW (ref 6.5–8.1)

## 2023-06-11 LAB — LACTATE DEHYDROGENASE: LDH: 297 U/L — ABNORMAL HIGH (ref 98–192)

## 2023-06-11 MED ORDER — SODIUM CHLORIDE 0.9 % IV SOLN: INTRAVENOUS | Status: DC

## 2023-06-11 MED ORDER — DEXTROSE 5 % IV SOLN: Freq: Once | INTRAVENOUS | Status: AC

## 2023-06-11 MED ORDER — FAM-TRASTUZUMAB DERUXTECAN-NXKI CHEMO 100 MG IV SOLR
300.0000 mg | Freq: Once | INTRAVENOUS | Status: AC
  Administered 2023-06-11: 300 mg via INTRAVENOUS
  Filled 2023-06-11: qty 15

## 2023-06-11 MED ORDER — HEPARIN SOD (PORK) LOCK FLUSH 100 UNIT/ML IV SOLN
500.0000 [IU] | Freq: Once | INTRAVENOUS | Status: AC | PRN
  Administered 2023-06-11: 500 [IU]

## 2023-06-11 MED ORDER — DIPHENHYDRAMINE HCL 25 MG PO CAPS
25.0000 mg | ORAL_CAPSULE | Freq: Once | ORAL | Status: AC
  Administered 2023-06-11: 25 mg via ORAL
  Filled 2023-06-11: qty 1

## 2023-06-11 MED ORDER — PALONOSETRON HCL INJECTION 0.25 MG/5ML
0.2500 mg | Freq: Once | INTRAVENOUS | Status: AC
  Administered 2023-06-11: 0.25 mg via INTRAVENOUS
  Filled 2023-06-11: qty 5

## 2023-06-11 MED ORDER — SODIUM CHLORIDE 0.9 % IV SOLN
150.0000 mg | Freq: Once | INTRAVENOUS | Status: AC
  Administered 2023-06-11: 150 mg via INTRAVENOUS
  Filled 2023-06-11: qty 150

## 2023-06-11 MED ORDER — SODIUM CHLORIDE 0.9% FLUSH
10.0000 mL | INTRAVENOUS | Status: DC | PRN
  Administered 2023-06-11: 10 mL

## 2023-06-11 MED ORDER — ACETAMINOPHEN 325 MG PO TABS
650.0000 mg | ORAL_TABLET | Freq: Once | ORAL | Status: AC
  Administered 2023-06-11: 650 mg via ORAL
  Filled 2023-06-11: qty 2

## 2023-06-11 MED ORDER — DEXAMETHASONE SODIUM PHOSPHATE 10 MG/ML IJ SOLN
10.0000 mg | Freq: Once | INTRAMUSCULAR | Status: AC
  Administered 2023-06-11: 10 mg via INTRAVENOUS
  Filled 2023-06-11: qty 1

## 2023-06-11 NOTE — Patient Instructions (Signed)

## 2023-06-11 NOTE — Patient Instructions (Signed)
 [facility] - A DEPT OF [facility]  Discharge Instructions: Thank you for choosing [facility] to provide your oncology and hematology care.   If you have a lab appointment with the Cancer Center, please go directly to the Cancer Center and check in at the registration area.  Wear comfortable clothing and clothing appropriate for easy access to any Portacath or PICC line.   We strive to give you quality time with your provider. You may need to reschedule your appointment if you arrive late (15 or more minutes).  Arriving late affects you and other patients whose appointments are after yours.  Also, if you miss three or more appointments without notifying the office, you may be dismissed from the clinic at the provider's discretion.      For prescription refill requests, have your pharmacy contact our office and allow 72 hours for refills to be completed.    Today you received the following chemotherapy and/or immunotherapy agents Fam-Trastuzumab Deruxtecan      To help prevent nausea and vomiting after your treatment, we encourage you to take your nausea medication as directed.  BELOW ARE SYMPTOMS THAT SHOULD BE REPORTED IMMEDIATELY: *FEVER GREATER THAN 100.4 F (38 C) OR HIGHER *CHILLS OR SWEATING *NAUSEA AND VOMITING THAT IS NOT CONTROLLED WITH YOUR NAUSEA MEDICATION *UNUSUAL SHORTNESS OF BREATH *UNUSUAL BRUISING OR BLEEDING *URINARY PROBLEMS (pain or burning when urinating, or frequent urination) *BOWEL PROBLEMS (unusual diarrhea, constipation, pain near the anus) TENDERNESS IN MOUTH AND THROAT WITH OR WITHOUT PRESENCE OF ULCERS (sore throat, sores in mouth, or a toothache) UNUSUAL RASH, SWELLING OR PAIN  UNUSUAL VAGINAL DISCHARGE OR ITCHING   Items with * indicate a potential emergency and should be followed up as soon as possible or go to the Emergency Department if any problems should occur.  Please show the CHEMOTHERAPY ALERT CARD or  IMMUNOTHERAPY ALERT CARD at check-in to the Emergency Department and triage nurse. Should you have questions after your visit or need to cancel or reschedule your appointment, please contact [facility] - A DEPT OF [facility]  [phone] and follow the prompts.  Office hours are 8:00 a.m. to 4:30 p.m. Monday - Friday. Please note that voicemails left after 4:00 p.m. may not be returned until the following business day.  We are closed weekends and major holidays. You have access to a nurse at all times for urgent questions. Please call the main number to the clinic [phone] and follow the prompts.  For any non-urgent questions, you may also contact your provider using MyChart. We now offer e-Visits for anyone 70 and older to request care online for non-urgent symptoms. For details visit [email or web address].   Also download the MyChart app! Go to the app store, search "MyChart", open the app, select [place], and log in with your MyChart username and password.

## 2023-06-11 NOTE — Progress Notes (Signed)
 Hematology and Oncology Follow Up Visit  Patricia [name] [id number] [birth year] 83 y.o. 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 Q4-6 months    Interim History:  [name] is here today with her husband for follow-up and treatment.   When we last saw her, we did do a PET scan.  The PET scan showed no evidence of obvious metastatic disease.  However, there was a new focus of metabolic activity at the T11 vertebral body.  We then did an MRI of the thoracic spine.  This was done on 05/29/2023.  This did show multiple osseous lesions.  However, the largest at T11.  There is no fractures.  There is no epidural tumor.  She is really not complain of any back discomfort.  She is having no problems with pain in her legs.  I think that we probably need to see about radiotherapy for her.  I know that she has had past radiotherapy to the spine and to the left hip.  Of note, her last CA 27.29 was 29.  She has had no cardiac issues.  She does have the atrial fibrillation.  She is on anticoagulation with Eliquis..  She is on quite a few other medications.  She has had no fever.  There is been no cough or increased shortness of breath.  She has had no nausea or vomiting.  She has had no change in bowel or bladder habits.  Overall, I would say that her performance status probably ECOG 1-2.  Medications:  Allergies as of 06/11/2023       Reactions   Combigan [brimonidine Tartrate-timolol] Itching   Per patient made eyes red, sore, and sensitivity to light   Sulfa  Antibiotics Hives, Rash   Sulfamethoxazole-trimethoprim Hives        Medication List        Accurate as of June 11, 2023 10:27 AM. If you have any questions, ask your nurse or doctor.          albuterol 108 (90 Base) MCG/ACT inhaler Commonly known as: VENTOLIN HFA Inhale 2 puffs into the lungs every 6 (six) hours as needed for wheezing or shortness of breath.   apixaban 5 MG Tabs tablet Commonly known as: Eliquis Take 1 tablet (5 mg total) by mouth 2 (two) times daily.   atorvastatin 40 MG tablet Commonly known as: LIPITOR TAKE 1 TABLET DAILY BEFORE SUPPER   bimatoprost 0.01 % Soln Commonly known as: LUMIGAN Place 1 drop into both eyes 2 (two) times daily.   calcium-vitamin D 500-200 MG-UNIT tablet Commonly known as: OSCAL WITH D Take 1 tablet by mouth daily with breakfast.   Cerefolin 07-31-48-5 MG Tabs Take 1 tablet by mouth every morning.   dexamethasone 4 MG tablet Commonly known as: DECADRON Take 2 tablets (8 mg) by mouth daily for 3 days starting the day after chemotherapy. Take with food.   digoxin 0.125 MG tablet Commonly known as: LANOXIN Take 1 tablet (0.125 mg total) by mouth daily.   donepezil 5 MG tablet Commonly known as: ARICEPT Take 1 tablet (5 mg  total) by mouth at bedtime.   dorzolamide-timolol 2-0.5 % ophthalmic solution Commonly known as: COSOPT Place 1 drop into both eyes at bedtime.   fluticasone 50 MCG/ACT nasal spray Commonly known as: FLONASE 1 spray each nostril following sinus rinses twice daily   fluticasone-salmeterol 230-21 MCG/ACT inhaler Commonly known as: Advair HFA USE 2 INHALATIONS TWICE A DAY   folic acid 400 MCG tablet Commonly known as: FOLVITE Take 400 mcg by mouth daily.   furosemide 20 MG tablet Commonly known as: LASIX TAKE 1 TABLET TWICE A DAY. TAKE ADDITIONAL 1 TABLET AS NEEDED FOR SWELLING   gabapentin 400 MG capsule Commonly known as: NEURONTIN TAKE 1 CAPSULE TWICE A DAY   levETIRAcetam 500 MG 24  hr tablet Commonly known as: KEPPRA XR TAKE 1 TABLET AT BEDTIME   lidocaine-prilocaine cream Commonly known as: EMLA Apply a dime size of cream to port-a-cath 1-2 hours prior to access. Cover with [employer].   loratadine 10 MG tablet Commonly known as: CLARITIN Take 10 mg by mouth daily.   memantine 10 MG tablet Commonly known as: NAMENDA TAKE 1 TABLET TWICE A DAY   metoprolol tartrate 50 MG tablet Commonly known as: LOPRESSOR Take 1 tablet (50 mg total) by mouth 2 (two) times daily.   Multivitamin/Iron Tabs Take 1 tablet by mouth daily.   ondansetron 8 MG tablet Commonly known as: Zofran Take 1 tablet (8 mg total) by mouth every 8 (eight) hours as needed for nausea or vomiting. Start on the third day after chemotherapy.   OVER THE COUNTER MEDICATION Take 1 tablet by mouth daily. Zyflamend otc herbal supplement   pantoprazole 40 MG tablet Commonly known as: PROTONIX TAKE 1 TABLET DAILY   potassium chloride SA 20 MEQ tablet Commonly known as: KLOR-CON M Take 1 tablet (20 mEq total) by mouth daily.   Prevagen 10 MG Caps Generic drug: Apoaequorin Take 1 capsule by mouth every morning.   prochlorperazine 10 MG tablet Commonly known as: COMPAZINE Take 1 tablet (10 mg total) by mouth every 6 (six) hours as needed for nausea or vomiting.   Rhopressa 0.02 % Soln Generic drug: Netarsudil Dimesylate Place 1 drop into both eyes at bedtime.   thyroid 60 MG tablet Commonly known as: Armour Thyroid Take 1 tablet (60 mg total) by mouth daily. Takes with 15mg  daily.   thyroid 15 MG tablet Commonly known as: Armour Thyroid Take 1 tablet (15 mg total) by mouth daily. Take with 60mg  for a total dose of 75mg  daily.   traMADol 50 MG tablet Commonly known as: ULTRAM Take 1 tablet (50 mg total) by mouth every 6 (six) hours as needed (post op pain).   Vitamin D3 50 MCG (2000 UT) capsule Take 2,000 Units by mouth daily.        Allergies:  Allergies  Allergen Reactions    Combigan [Brimonidine Tartrate-Timolol] Itching    Per patient made eyes red, sore, and sensitivity to light   Sulfa Antibiotics Hives and Rash   Sulfamethoxazole-Trimethoprim Hives    Past Medical History, Surgical history, Social history, and Family History were reviewed and updated.  Review of Systems: Review of Systems  Constitutional: Negative.   HENT: Negative.    Eyes: Negative.   Respiratory: Negative.    Cardiovascular: Negative.   Gastrointestinal: Negative.   Genitourinary: Negative.   Musculoskeletal: Negative.   Skin: Negative.   Neurological: Negative.   Endo/Heme/Allergies: Negative.   Psychiatric/Behavioral:  Positive for memory loss.      Physical Exam:  height is 5\' 1"  (1.549 m) and weight is 130 lb (59 kg). Her oral temperature is 98.3 F (36.8 C). Her blood pressure is 110/79 and her pulse is 80. Her respiration is 18 and oxygen saturation is 93%.   Wt Readings from Last 3 Encounters:  06/11/23 130 lb (59 kg)  05/21/23 131 lb 6.4 oz (59.6 kg)  05/11/23 130 lb 9.6 oz (59.2 kg)    Physical Exam Vitals reviewed.  HENT:     Head: Normocephalic and atraumatic.  Eyes:     Pupils: Pupils are equal, round, and reactive to light.  Cardiovascular:     Rate and Rhythm: Normal rate and regular rhythm.     Heart sounds: Normal heart sounds.  Pulmonary:     Effort: Pulmonary effort is normal.     Breath sounds: Normal breath sounds.  Abdominal:     General: Bowel sounds are normal.     Palpations: Abdomen is soft.  Musculoskeletal:        General: No tenderness or deformity. Normal range of motion.     Cervical back: Normal range of motion.  Lymphadenopathy:     Cervical: No cervical adenopathy.  Skin:    General: Skin is warm and dry.     Findings: No erythema or rash.  Neurological:     Mental Status: She is alert and oriented to person, place, and time.  Psychiatric:        Behavior: Behavior normal.        Thought Content: Thought content  normal.        Judgment: Judgment normal.       Lab Results  Component Value Date   WBC 4.6 06/11/2023   HGB 11.7 (L) 06/11/2023   HCT 35.6 (L) 06/11/2023   MCV 102.9 (H) 06/11/2023   PLT 159 06/11/2023   Lab Results  Component Value Date   FERRITIN 121 05/21/2023   IRON 80 05/21/2023   TIBC 295 05/21/2023   UIBC 215 05/21/2023   IRONPCTSAT 27 05/21/2023   Lab Results  Component Value Date   RETICCTPCT 5.2 (H) 05/21/2023   RBC 3.46 (L) 06/11/2023   Lab Results  Component Value Date   KPAFRELGTCHN 25.6 (H) 05/30/2021   LAMBDASER 15.6 05/30/2021   KAPLAMBRATIO 1.64 05/30/2021   Lab Results  Component Value Date   IGGSERUM 1,122 05/30/2021   IGA 184 05/30/2021   IGMSERUM 235 (H) 05/30/2021   Lab Results  Component Value Date   TOTALPROTELP 6.6 05/30/2021   ALBUMINELP 3.5 05/30/2021   A1GS 0.2 05/30/2021   A2GS 0.8 05/30/2021   BETS 0.9 05/30/2021   GAMS 1.2 05/30/2021   MSPIKE Not Observed 05/30/2021     Chemistry      Component Value Date/Time   NA 139 06/11/2023 0920   NA 142 12/24/2022 0925   NA 141 09/22/2016 1459   K 4.0 06/11/2023 0920   K 3.8 09/22/2016 1459   CL 105 06/11/2023 0920   CO2 27 06/11/2023 0920   CO2 26 09/22/2016 1459   BUN 22 06/11/2023 0920   BUN 35 (H) 12/24/2022 0925   BUN 19.8 09/22/2016 1459   CREATININE 0.85 06/11/2023 0920   CREATININE 1.1 09/22/2016 1459      Component Value Date/Time   CALCIUM 9.0 06/11/2023 0920   CALCIUM 9.2 09/22/2016 1459   ALKPHOS 75 06/11/2023 0920   ALKPHOS 98 09/22/2016 1459   AST 28 06/11/2023 0920   AST 15 09/22/2016 1459   ALT 23  06/11/2023 0920   ALT 21 09/22/2016 1459   BILITOT 0.6 06/11/2023 0920   BILITOT 0.65 09/22/2016 1459       Impression and Plan: [name] is a very nice 83 yo postmenopausal caucasian female with a solitary metastatic focus from breast cancer.  Her initial breast cancer was probably 7 years ago and was estrogen positive. 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  The HER2 was equivocal on initial staining.  Again, she has this T11 lesion.  She is really not symptomatic back to her.  However, I do think that it may be worthwhile doing some radiation therapy.  The the real issue is that she has a marginal performance status.  I am not sure how well she would tolerate systemic chemotherapy.  I think that she is doing well with the Enhertu overall.  As such, I think that we should probably continue with the Enhertu.  Her tumor marker has been holding steady.  We will go ahead and plan for Enhertu today.  We will then plan to get her back to see [name] in another 3-4 weeks.  I did speak with Dr. Lonie Peak of Radiation Oncology.  She will get Ms. Blassingame in for an evaluation and possible radiotherapy. Marland Kitchen    Josph Macho, MD 4/11/202510:27 AM

## 2023-06-12 LAB — CANCER ANTIGEN 27.29: CA 27.29: 35.8 U/mL (ref 0.0–38.6)

## 2023-06-14 ENCOUNTER — Telehealth: Payer: Self-pay

## 2023-06-14 NOTE — Telephone Encounter (Signed)
 Clinical Social Work was referred by medical provider for assessment of psychosocial needs.  CSW attempted to contact patient's husband by phone.  Left voicemail with contact information and request for return call.

## 2023-06-15 ENCOUNTER — Telehealth: Payer: Self-pay | Admitting: Radiation Oncology

## 2023-06-15 NOTE — Telephone Encounter (Signed)
 4/15 @ 11:03 am Left voicemail for patient to call our office to be schedule for an follow up consult.

## 2023-06-16 ENCOUNTER — Inpatient Hospital Stay: Admitting: Licensed Clinical Social Worker

## 2023-06-16 NOTE — Progress Notes (Signed)
 [facility] Clinical Social Work  Clinical Social Work was referred by nurse for assessment of psychosocial needs.  Clinical Social Worker contacted caregiver by phone to offer support and assess for needs.    Spoke with Herbst, patient's husband, and assessed for any psychosocial needs.  He stated he and [name] were doing well.  He confirmed that he could contact CSW in the future if their needs changed.     Kennth Peal, LCSW  Clinical Social Worker [facility]

## 2023-06-21 NOTE — Progress Notes (Signed)
 Radiation Oncology         [phone] ________________________________  Initial outpatient Re-Consultation  Name: Patricia [name] MRN: [id number]  Date: 06/22/2023  DOB: October 01, 1940  VH:[facility], [place], [place]  Maria Shiner Sherryll Donald, MD   REFERRING PHYSICIAN: Ivor Mars, MD  DIAGNOSIS: No diagnosis found.  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   Cancer Staging  Malignant neoplasm of upper-outer quadrant of left breast in female, estrogen receptor positive (HCC) Staging form: Breast, AJCC 7th Edition - Clinical: Stage IIA (T2, N0, cM0) - Unsigned Specimen type: Core Needle Biopsy Laterality: Left Staging comments: Staged at breast conference 06/14/13  - Pathologic stage from 06/28/2014: Stage IA (yT1c, N0, cM0) - Signed by Windle Hatch, MD on 07/12/2014 Staged by: Pathologist Specimen type: Core Needle Biopsy Stage prefix: Post-therapy Laterality: Left Estrogen receptor status: Positive Progesterone receptor status: Negative HER2 status: Negative Stage used in treatment planning: Yes National guidelines used in treatment planning: Yes Type of national guideline used in treatment planning: NCCN Staging comments: Staged on final lumpectomy specimen by Dr. Asa Bjork - Pathologic stage from 07/04/2021: Stage IV (rT1b, N0, M1) - Signed by Ivor Mars, MD on 07/04/2021 Staged by: Managing physician Diagnostic confirmation: Positive histology Specimen type: Fine Needle Aspirate Histopathologic type: Adenocarcinoma, metastatic, NOS Stage prefix: Recurrence Laterality: Right Biopsy of metastatic site performed: Yes Source of metastatic specimen: Bone Histologic grade (G): G2 Lymph-vascular invasion (LVI): Presence of LVI unknown/indeterminate Residual tumor (R): RX Paget's disease: Not assessed Estrogen receptor status: Negative Progesterone receptor status: Negative HER2 status: Equivocal  CHIEF COMPLAINT: Here to discuss management of osseous metastatic disease.   HISTORY OF  PRESENT ILLNESS::Patricia [name] is a 83 y.o. female who presents today for consideration of radiation therapy in management of a new T11 lesion. She was last seen in office on 01/02/22 for a routine follow up.   In the interval since her last visit, she presented for restaging PET scan on 3/4/ 25 showing new focus of metabolic activity in the T11 vertebral body, concerning of recurrent disease. No evidence of local breast cancer recurrence or evidence of metastatic disease in thorax.     Subsequently, she underwent a Thoracic spine MRI on 05/29/23 showing multiple osseous lesions concerning for metastatic disease,the largest in the left aspect of the T11 vertebral body, measuring 2.7 x 2.0 x 2.3 cm  with low T1 signal, heterogeneous T2 signal and prominent enhancement following contrast. No definite pathologic fracture or epidural tumor or acute findings or significant spinal stenosis were noted on exam.    She presented for a follow up with Dr. Maria Shiner on 06/11/23 during which she reported feeling well overall. At that time, she was asymptomatic with no complains of back pain or leg pain.   Current therapy: Enhertu  5.4 mg/kg - s/p cycle #11 - start on 07/16/2022 Xgeva  120 mg IM every 3 months-next dose 08/2023   PREVIOUS RADIATION THERAPY: Yes  3)Radiation Treatment Dates: 11/19/2021 through 11/28/2021 Site Technique Total Dose (Gy) Dose per Fx (Gy) Completed Fx Beam Energies  Hip, Left: Pelvis_L_acetab IMRT 40/40 8 5/5 6XFFF      2)Radiation Treatment Dates: 07/30/2021 through 08/08/2021 Site Technique Total Dose (Gy) Dose per Fx (Gy) Completed Fx Beam Energies  Lumbar Spine: Spine_L5_S1 IMRT/SBRT 35/35 7 5/5 6XFFF  Ribs, Left: Chest_L_9thRib IMRT/SBRT 40/40 8 5/5 6XFFF    1)Radiation treatment dates: 10/22/2014-11/23/2014 Site/dose:   Left breast/ 42.72 Gy at 2.67 Gy per fraction x 16 fractions.  Left breast boost/ 12 Gy at 2  Gy per fraction x 6 fractions Beams/energy:  Opposed tangents with 3D  breath hold with 6/10 MV photons Enface/ 15 MeV   PAST MEDICAL HISTORY:  has a past medical history of Aortic regurgitation (06/17/2015), Arthritis, Cancer (HCC), Chronic atrial fibrillation (HCC), Chronic diastolic heart failure (HCC) (16/12/9602), CKD (chronic kidney disease), stage III (HCC), Cough variant asthma, Dementia (HCC), Diverticulosis, Essential hypertension, GERD (gastroesophageal reflux disease), Glaucoma, Hiatal hernia, Hyperlipidemia, Hypothyroidism, Internal hemorrhoids, Laryngospasm, Mitral regurgitation, Multinodular goiter, Osteopenia, Postmenopausal, Pulmonary HTN (HCC), Radiation (10/22/14-11/23/14), Stricture and stenosis of cervix, and Stroke (HCC).    PAST SURGICAL HISTORY: Past Surgical History:  Procedure Laterality Date   BREAST LUMPECTOMY Left    BREAST SURGERY     CARDIOVERSION N/A 06/24/2015   Procedure: CARDIOVERSION;  Surgeon: Jacqueline Matsu, MD;  Location: [facility];  Service: Cardiovascular;  Laterality: N/A;   CARDIOVERSION N/A 05/19/2016   Procedure: CARDIOVERSION;  Surgeon: Jacqueline Matsu, MD;  Location: [facility];  Service: Cardiovascular;  Laterality: N/A;   COLONOSCOPY     EYE SURGERY Bilateral    cataract removal by Dr. 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   RADIOACTIVE SEED GUIDED PARTIAL MASTECTOMY WITH AXILLARY SENTINEL LYMPH NODE BIOPSY Left 06/26/2014   Procedure: RADIOACTIVE SEED GUIDED PARTIAL MASTECTOMY WITH AXILLARY SENTINEL LYMPH NODE BIOPSY;  Surgeon: Lockie Rima, MD;  Location: [facility];  Service: General;  Laterality: Left;   RE-EXCISION OF BREAST LUMPECTOMY Left 08/07/2014   Procedure: RE-EXCISION OF  LEFT BREAST LUMPECTOMY;  Surgeon: Lockie Rima, MD;  Location:  SURGERY CENTER;  Service: General;  Laterality: Left;   RIGHT HEART CATH N/A 04/27/2016   Procedure: Right Heart Cath;  Surgeon: Darlis Eisenmenger, MD;  Location: [facility]ASIVE CV LAB;  Service: Cardiovascular;  Laterality: N/A;   TEE WITHOUT CARDIOVERSION N/A 01/07/2021   Procedure: TRANSESOPHAGEAL ECHOCARDIOGRAM (TEE);  Surgeon: Alroy Aspen Lela Purple, MD;  [facility];  Service: Cardiovascular;  Laterality: N/A;    FAMILY HISTORY: family history includes Atrial fibrillation in her son; Hyperlipidemia in her father and mother; Hypertension in her father and mother; Stroke in her father and mother; Transient ischemic attack in her father.  SOCIAL HISTORY:  reports that she quit smoking about 57 years ago. Her smoking use included cigarettes. She started smoking about 62 years ago. She has a 3.8 pack-year smoking history. She has been exposed to tobacco smoke. She has never used smokeless tobacco. She reports that she does not currently use alcohol. She reports that she does not use drugs.  ALLERGIES: Combigan [brimonidine tartrate-timolol ], Sulfa antibiotics, and Sulfamethoxazole-trimethoprim  MEDICATIONS:  Current Outpatient Medications  Medication Sig Dispense Refill   albuterol  (PROVENTIL  HFA;VENTOLIN  HFA) 108 (90 Base) MCG/ACT inhaler Inhale 2 puffs into the lungs every 6 (six) hours as needed for wheezing or shortness of breath.     apixaban  (ELIQUIS ) 5 MG TABS tablet Take 1 tablet (5 mg total) by mouth 2 (two) times daily. 180 tablet 3   Apoaequorin (PREVAGEN) 10 MG CAPS Take 1 capsule by mouth every morning. 90 capsule 3   atorvastatin  (LIPITOR) 40 MG tablet TAKE 1 TABLET DAILY BEFORE SUPPER 90 tablet 3  bimatoprost (LUMIGAN) 0.01 % SOLN Place 1 drop into both eyes 2 (two) times daily.     calcium -vitamin D  (OSCAL WITH D) 500-200 MG-UNIT tablet Take 1 tablet by mouth daily with breakfast. 30 tablet 0    Cholecalciferol  (VITAMIN D3) 50 MCG (2000 UT) capsule Take 2,000 Units by mouth daily.     dexamethasone  (DECADRON ) 4 MG tablet Take 2 tablets (8 mg) by mouth daily for 3 days starting the day after chemotherapy. Take with food. 30 tablet 1   digoxin  (LANOXIN ) 0.125 MG tablet Take 1 tablet (0.125 mg total) by mouth daily. 90 tablet 3   donepezil  (ARICEPT ) 5 MG tablet Take 1 tablet (5 mg total) by mouth at bedtime. 90 tablet 3   dorzolamide -timolol  (COSOPT ) 22.3-6.8 MG/ML ophthalmic solution Place 1 drop into both eyes at bedtime.     fluticasone  (FLONASE ) 50 MCG/ACT nasal spray 1 spray each nostril following sinus rinses twice daily 16 g 2   fluticasone -salmeterol (ADVAIR  HFA) 230-21 MCG/ACT inhaler USE 2 INHALATIONS TWICE A DAY 36 g 3   folic acid  (FOLVITE ) 400 MCG tablet Take 400 mcg by mouth daily.     furosemide  (LASIX ) 20 MG tablet TAKE 1 TABLET TWICE A DAY. TAKE ADDITIONAL 1 TABLET AS NEEDED FOR SWELLING 180 tablet 3   gabapentin  (NEURONTIN ) 400 MG capsule TAKE 1 CAPSULE TWICE A DAY 180 capsule 3   L-Methylfolate-B12-B6-B2 (CEREFOLIN) 07-31-48-5 MG TABS Take 1 tablet by mouth every morning. 90 tablet 3   levETIRAcetam  (KEPPRA  XR) 500 MG 24 hr tablet TAKE 1 TABLET AT BEDTIME 90 tablet 3   lidocaine -prilocaine  (EMLA ) cream Apply a dime size of cream to port-a-cath 1-2 hours prior to access. Cover with [employer]. 30 g 3   loratadine  (CLARITIN ) 10 MG tablet Take 10 mg by mouth daily.     memantine  (NAMENDA ) 10 MG tablet TAKE 1 TABLET TWICE A DAY 180 tablet 3   metoprolol  tartrate (LOPRESSOR ) 50 MG tablet Take 1 tablet (50 mg total) by mouth 2 (two) times daily. 180 tablet 3   Multiple Vitamins-Iron (MULTIVITAMIN/IRON) TABS Take 1 tablet by mouth daily.     Netarsudil  Dimesylate (RHOPRESSA) 0.02 % SOLN Place 1 drop into both eyes at bedtime.     ondansetron  (ZOFRAN ) 8 MG tablet Take 1 tablet (8 mg total) by mouth every 8 (eight) hours as needed for nausea or vomiting. Start on the third day after  chemotherapy. 30 tablet 1   OVER THE COUNTER MEDICATION Take 1 tablet by mouth daily. Zyflamend otc herbal supplement     pantoprazole  (PROTONIX ) 40 MG tablet TAKE 1 TABLET DAILY 90 tablet 3   potassium chloride  SA (KLOR-CON  M) 20 MEQ tablet Take 1 tablet (20 mEq total) by mouth daily. 90 tablet 3   prochlorperazine  (COMPAZINE ) 10 MG tablet Take 1 tablet (10 mg total) by mouth every 6 (six) hours as needed for nausea or vomiting. 30 tablet 1   thyroid  (ARMOUR THYROID ) 15 MG tablet Take 1 tablet (15 mg total) by mouth daily. Take with 60mg  for a total dose of 75mg  daily. 90 tablet 1   thyroid  (ARMOUR THYROID ) 60 MG tablet Take 1 tablet (60 mg total) by mouth daily. Takes with 15mg  daily. 90 tablet 1   traMADol  (ULTRAM ) 50 MG tablet Take 1 tablet (50 mg total) by mouth every 6 (six) hours as needed (post op pain). 60 tablet 0   No current facility-administered medications for this encounter.   Facility-Administered Medications Ordered in Other Encounters  Medication  Dose Route Frequency Provider Last Rate Last Admin   acetaminophen  (TYLENOL ) tablet 650 mg  650 mg Oral Once Ennever, Peter R, MD       dexamethasone  (DECADRON ) 10 mg in sodium chloride  0.9 % 50 mL IVPB  10 mg Intravenous Once Ennever, Sherryll Donald, MD       dextrose  5 % solution   Intravenous Once Ennever, Sherryll Donald, MD       diphenhydrAMINE  (BENADRYL ) capsule 50 mg  50 mg Oral Once Ennever, Peter R, MD       fosaprepitant  (EMEND) 150 mg in sodium chloride  0.9 % 145 mL IVPB  150 mg Intravenous Once Ennever, Peter R, MD       palonosetron  (ALOXI ) injection 0.25 mg  0.25 mg Intravenous Once Ennever, Peter R, MD        REVIEW OF SYSTEMS:  Notable for that above.   PHYSICAL EXAM:  vitals were not taken for this visit.   General: Alert and oriented, in no acute distress *** HEENT: Head is normocephalic. Extraocular movements are intact. Oropharynx is clear. Neck: Neck is supple, no palpable cervical or supraclavicular  lymphadenopathy. Heart: Regular in rate and rhythm with no murmurs, rubs, or gallops. Chest: Clear to auscultation bilaterally, with no rhonchi, wheezes, or rales. Abdomen: Soft, nontender, nondistended, with no rigidity or guarding. Extremities: No cyanosis or edema. Lymphatics: see Neck Exam Skin: No concerning lesions. Musculoskeletal: symmetric strength and muscle tone throughout. Neurologic: Cranial nerves II through XII are grossly intact. No obvious focalities. Speech is fluent. Coordination is intact. Psychiatric: Judgment and insight are intact. Affect is appropriate.   ECOG = ***  0 - Asymptomatic (Fully active, able to carry on all predisease activities without restriction)  1 - Symptomatic but completely ambulatory (Restricted in physically strenuous activity but ambulatory and able to carry out work of a light or sedentary nature. For example, light housework, office work)  2 - Symptomatic, <50% in bed during the day (Ambulatory and capable of all self care but unable to carry out any work activities. Up and about more than 50% of waking hours)  3 - Symptomatic, >50% in bed, but not bedbound (Capable of only limited self-care, confined to bed or chair 50% or more of waking hours)  4 - Bedbound (Completely disabled. Cannot carry on any self-care. Totally confined to bed or chair)  5 - Death   Aurea Blossom MM, Creech RH, Tormey DC, et al. [phone]). "Toxicity and response criteria of the [facility] Group". Am. Hillard Lowes. Oncol. 5 (6): 649-55   LABORATORY DATA:  Lab Results  Component Value Date   WBC 4.6 06/11/2023   HGB 11.7 (L) 06/11/2023   HCT 35.6 (L) 06/11/2023   MCV 102.9 (H) 06/11/2023   PLT 159 06/11/2023   CMP     Component Value Date/Time   NA 139 06/11/2023 0920   NA 142 12/24/2022 0925   NA 141 09/22/2016 1459   K 4.0 06/11/2023 0920   K 3.8 09/22/2016 1459   CL 105 06/11/2023 0920   CO2 27 06/11/2023 0920   CO2 26 09/22/2016 1459   GLUCOSE  120 (H) 06/11/2023 0920   GLUCOSE 116 09/22/2016 1459   GLUCOSE 88 01/13/2006 1012   BUN 22 06/11/2023 0920   BUN 35 (H) 12/24/2022 0925   BUN 19.8 09/22/2016 1459   CREATININE 0.85 06/11/2023 0920   CREATININE 1.1 09/22/2016 1459   CALCIUM  9.0 06/11/2023 0920   CALCIUM  9.2 09/22/2016 1459   PROT 5.8 (  L) 06/11/2023 0920   PROT 5.9 (L) 12/24/2022 0925   PROT 7.1 09/22/2016 1459   ALBUMIN  3.6 06/11/2023 0920   ALBUMIN  3.8 12/24/2022 0925   ALBUMIN  3.8 09/22/2016 1459   AST 28 06/11/2023 0920   AST 15 09/22/2016 1459   ALT 23 06/11/2023 0920   ALT 21 09/22/2016 1459   ALKPHOS 75 06/11/2023 0920   ALKPHOS 98 09/22/2016 1459   BILITOT 0.6 06/11/2023 0920   BILITOT 0.65 09/22/2016 1459   GFR 57.38 (L) 04/01/2016 0952   EGFR 70 12/24/2022 0925   GFRNONAA >60 06/11/2023 0920         RADIOGRAPHY: MR THORACIC SPINE W WO CONTRAST Result Date: 05/29/2023 CLINICAL DATA:  abnormal PET scan History of invasive breast cancer with new hypermetabolic activity in the T11 vertebral body. EXAM: MRI THORACIC WITHOUT AND WITH CONTRAST TECHNIQUE: Multiplanar and multiecho pulse sequences of the thoracic spine were obtained without and with intravenous contrast. CONTRAST:  5mL GADAVIST  GADOBUTROL  1 MMOL/ML IV SOLN COMPARISON:  Chest radiographs 05/10/2023, PET-CT 05/04/2023 and 02/17/2023. Chest CT 08/13/2022. FINDINGS: Alignment: Mild convex right thoracic scoliosis. No focal angulation or significant listhesis. Vertebrae: There are multiple osseous lesions concerning for metastatic disease. The largest lesion in the left aspect of the T11 vertebral body demonstrates low T1 signal, heterogeneous T2 signal and prominent enhancement following contrast, measuring approximately 2.7 x 2.0 x 2.3 cm. There are other smaller enhancing lesions within the right T4 transverse process and spinous process, the left aspect of the T5 vertebral body, the L1 and L3 vertebral bodies. No definite pathologic fracture or  epidural tumor. Cord: The thoracic cord appears normal in signal and caliber.No abnormal intradural enhancement. The conus medullaris extends to the L1-2 level. Paraspinal and other soft tissues: No acute paraspinal findings. Moderate left and small right pleural effusions, similar to recent PET-CT. Disc levels: Multilevel spondylosis, most pronounced within the lower cervical spine. At C5-6 and C6-7, there are posterior osteophytes contributing to mild spinal stenosis and mild biforaminal narrowing. No significant spinal stenosis within the thoracic spine. There is mild foraminal narrowing, greatest on the left at T11-12. IMPRESSION: 1. Multiple osseous lesions concerning for metastatic disease, as described, the largest in the left aspect of the T11 vertebral body. No definite pathologic fracture or epidural tumor. 2. No acute findings or significant spinal stenosis. 3. Moderate left and small right pleural effusions, similar to recent PET-CT. Electronically Signed   By: Elmon Hagedorn M.D.   On: 05/29/2023 13:41      IMPRESSION/PLAN:***    On date of service, in total, I spent *** minutes on this encounter. Patient was seen in person.   __________________________________________   Colie Dawes, MD  This document serves as a record of services personally performed by Colie Dawes, MD. It was created on her behalf by Lucky Sable, a trained medical scribe. The creation of this record is based on the scribe's personal observations and the provider's statements to them. This document has been checked and approved by the attending provider.

## 2023-06-22 ENCOUNTER — Encounter: Payer: Self-pay | Admitting: Radiation Oncology

## 2023-06-22 ENCOUNTER — Ambulatory Visit
Admission: RE | Admit: 2023-06-22 | Discharge: 2023-06-22 | Disposition: A | Discharge status: Home / Self Care | Source: Ambulatory Visit | Attending: Radiation Oncology | Admitting: Radiation Oncology

## 2023-06-22 VITALS — BP 118/63 | HR 79 | Temp 97.0°F | Resp 20 | Ht 60.0 in | Wt 129.8 lb

## 2023-06-22 DIAGNOSIS — C7951 Secondary malignant neoplasm of bone: Secondary | ICD-10-CM | POA: Diagnosis not present

## 2023-06-22 DIAGNOSIS — C50412 Malignant neoplasm of upper-outer quadrant of left female breast: Secondary | ICD-10-CM | POA: Diagnosis not present

## 2023-06-22 DIAGNOSIS — Z170 Estrogen receptor positive status [ER+]: Secondary | ICD-10-CM | POA: Diagnosis not present

## 2023-06-22 NOTE — Progress Notes (Addendum)
 Histology and Location of Primary Cancer:  Metastatic Adenocarcinoma of the breast-L5 Metastasis  Metastatic Breast Cancer to Bone T11 Lesion  Location(s) of Symptomatic tumor(s):  5 out of 10 pain in the middle of her back  05/29/2023 MRI Thoracic Spine W WO Contrast   Past/Anticipated chemotherapy by medical oncology, if any:  06/11/2023 Dr. Maria Shiner,   Radiation Therapy  Patient's main complaints related to symptomatic tumor(s) are:  Intermittent 5 out of 10 back pain, manages pain with occasional Tramadol   Pain on a scale of 0-10 is:  Patient denies    If Spine Met(s), symptoms, if any, include: Bowel/Bladder retention or incontinence (please describe): None Numbness or weakness in extremities (please describe): None Current Decadron  regimen, if applicable: None  Ambulatory status? Walker? Wheelchair?:Uses a cane for walking during the day and a walker at night  SAFETY ISSUES: Prior radiation? Yes Pacemaker/ICD? None Possible current pregnancy? None Is the patient on methotrexate? None  Additional Complaints / other details:   BP 118/63 (BP Location: Right Arm, Patient Position: Sitting, Cuff Size: Normal)   Pulse 79   Temp (!) 97 F (36.1 C)   Resp 20   Ht 5' (1.524 m)   Wt 129 lb 12.8 oz (58.9 kg)   SpO2 98%   BMI 25.35 kg/m   Wt Readings from Last 3 Encounters:  06/22/23 129 lb 12.8 oz (58.9 kg)  06/11/23 130 lb (59 kg)  05/21/23 131 lb 6.4 oz (59.6 kg)    None

## 2023-06-23 DIAGNOSIS — Z170 Estrogen receptor positive status [ER+]: Secondary | ICD-10-CM | POA: Diagnosis not present

## 2023-06-23 DIAGNOSIS — C7951 Secondary malignant neoplasm of bone: Secondary | ICD-10-CM | POA: Diagnosis not present

## 2023-06-23 DIAGNOSIS — C50412 Malignant neoplasm of upper-outer quadrant of left female breast: Secondary | ICD-10-CM | POA: Diagnosis not present

## 2023-06-24 DIAGNOSIS — H04123 Dry eye syndrome of bilateral lacrimal glands: Secondary | ICD-10-CM | POA: Diagnosis not present

## 2023-06-24 DIAGNOSIS — H401133 Primary open-angle glaucoma, bilateral, severe stage: Secondary | ICD-10-CM | POA: Diagnosis not present

## 2023-06-28 ENCOUNTER — Other Ambulatory Visit: Payer: Self-pay | Admitting: Family Medicine

## 2023-06-28 DIAGNOSIS — E032 Hypothyroidism due to medicaments and other exogenous substances: Secondary | ICD-10-CM

## 2023-06-28 NOTE — Telephone Encounter (Signed)
 Copied from CRM [id number]. Topic: Clinical - Medication Refill >> Jun 28, 2023  9:58 AM Lorrane Rosette wrote: Most Recent Primary Care Visit:  Provider: Maryclare Smoke A  Department: [facility]  Visit Type: FOLLOW UP 20  Date: 01/21/2023  Medication: thyroid  (ARMOUR THYROID ) 15 MG tablet  Has the patient contacted their pharmacy? Yes  Is this the correct pharmacy for this prescription? Yes If no, delete pharmacy and type the correct one.  This is the patient's preferred pharmacy:  [facility] - [place], [place] - [address] [place] [address] Phone: [phone] Fax: [phone]  Has the prescription been filled recently? Yes  Is the patient out of the medication? No  Has the patient been seen for an appointment in the last year OR does the patient have an upcoming appointment? Yes  Can we respond through MyChart? Yes  Agent: Please be advised that Rx refills may take up to 3 business days. We ask that you follow-up with your pharmacy.

## 2023-06-28 NOTE — Telephone Encounter (Signed)
 Copied from CRM [id number]. Topic: Clinical - Medication Refill >> Jun 28, 2023 10:07 AM Lorrane Rosette wrote: Most Recent Primary Care Visit:  Provider: Maryclare Smoke A  Department: [facility]  Visit Type: FOLLOW UP 20  Date: 01/21/2023  Medication: thyroid  (ARMOUR THYROID ) 15 MG tablet  Has the patient contacted their pharmacy? Yes  Is this the correct pharmacy for this prescription? Yes If no, delete pharmacy and type the correct one.  This is the patient's preferred pharmacy:  [facility] - [place], [place] - [address] [place] [address] Phone: [phone] Fax: [phone]  Has the prescription been filled recently? No  Is the patient out of the medication? No  Has the patient been seen for an appointment in the last year OR does the patient have an upcoming appointment? Yes  Can we respond through MyChart? Yes  Agent: Please be advised that Rx refills may take up to 3 business days. We ask that you follow-up with your pharmacy.

## 2023-06-30 ENCOUNTER — Encounter: Payer: Self-pay | Admitting: Radiation Oncology

## 2023-06-30 MED ORDER — THYROID 15 MG PO TABS: 15.0000 mg | ORAL_TABLET | Freq: Every day | ORAL | 1 refills | Status: DC

## 2023-07-01 ENCOUNTER — Ambulatory Visit: Admitting: Radiation Oncology

## 2023-07-02 ENCOUNTER — Inpatient Hospital Stay (HOSPITAL_BASED_OUTPATIENT_CLINIC_OR_DEPARTMENT_OTHER): Admitting: Hematology & Oncology

## 2023-07-02 ENCOUNTER — Other Ambulatory Visit: Payer: Self-pay | Admitting: *Deleted

## 2023-07-02 ENCOUNTER — Inpatient Hospital Stay: Attending: Hematology & Oncology

## 2023-07-02 ENCOUNTER — Encounter: Payer: Self-pay | Admitting: Hematology & Oncology

## 2023-07-02 ENCOUNTER — Other Ambulatory Visit: Payer: Self-pay

## 2023-07-02 ENCOUNTER — Inpatient Hospital Stay

## 2023-07-02 VITALS — BP 112/65 | HR 79 | Temp 99.1°F | Resp 16 | Ht 60.0 in | Wt 127.0 lb

## 2023-07-02 DIAGNOSIS — C7951 Secondary malignant neoplasm of bone: Secondary | ICD-10-CM

## 2023-07-02 DIAGNOSIS — Z85118 Personal history of other malignant neoplasm of bronchus and lung: Secondary | ICD-10-CM

## 2023-07-02 DIAGNOSIS — I517 Cardiomegaly: Secondary | ICD-10-CM

## 2023-07-02 DIAGNOSIS — J810 Acute pulmonary edema: Secondary | ICD-10-CM

## 2023-07-02 DIAGNOSIS — M8580 Other specified disorders of bone density and structure, unspecified site: Secondary | ICD-10-CM

## 2023-07-02 DIAGNOSIS — I639 Cerebral infarction, unspecified: Secondary | ICD-10-CM

## 2023-07-02 DIAGNOSIS — G40909 Epilepsy, unspecified, not intractable, without status epilepticus: Secondary | ICD-10-CM

## 2023-07-02 DIAGNOSIS — J385 Laryngeal spasm: Secondary | ICD-10-CM

## 2023-07-02 DIAGNOSIS — I272 Pulmonary hypertension, unspecified: Secondary | ICD-10-CM

## 2023-07-02 DIAGNOSIS — K5730 Diverticulosis of large intestine without perforation or abscess without bleeding: Secondary | ICD-10-CM

## 2023-07-02 DIAGNOSIS — M898X5 Other specified disorders of bone, thigh: Secondary | ICD-10-CM

## 2023-07-02 DIAGNOSIS — H401111 Primary open-angle glaucoma, right eye, mild stage: Secondary | ICD-10-CM

## 2023-07-02 DIAGNOSIS — Z0000 Encounter for general adult medical examination without abnormal findings: Secondary | ICD-10-CM

## 2023-07-02 DIAGNOSIS — I340 Nonrheumatic mitral (valve) insufficiency: Secondary | ICD-10-CM

## 2023-07-02 DIAGNOSIS — Z8673 Personal history of transient ischemic attack (TIA), and cerebral infarction without residual deficits: Secondary | ICD-10-CM

## 2023-07-02 DIAGNOSIS — R942 Abnormal results of pulmonary function studies: Secondary | ICD-10-CM

## 2023-07-02 DIAGNOSIS — Z170 Estrogen receptor positive status [ER+]: Secondary | ICD-10-CM

## 2023-07-02 DIAGNOSIS — Z5112 Encounter for antineoplastic immunotherapy: Secondary | ICD-10-CM | POA: Insufficient documentation

## 2023-07-02 DIAGNOSIS — H401122 Primary open-angle glaucoma, left eye, moderate stage: Secondary | ICD-10-CM

## 2023-07-02 DIAGNOSIS — C50912 Malignant neoplasm of unspecified site of left female breast: Secondary | ICD-10-CM | POA: Diagnosis not present

## 2023-07-02 DIAGNOSIS — R4182 Altered mental status, unspecified: Secondary | ICD-10-CM

## 2023-07-02 DIAGNOSIS — K219 Gastro-esophageal reflux disease without esophagitis: Secondary | ICD-10-CM

## 2023-07-02 DIAGNOSIS — Z124 Encounter for screening for malignant neoplasm of cervix: Secondary | ICD-10-CM

## 2023-07-02 DIAGNOSIS — N951 Menopausal and female climacteric states: Secondary | ICD-10-CM

## 2023-07-02 DIAGNOSIS — N1831 Chronic kidney disease, stage 3a: Secondary | ICD-10-CM

## 2023-07-02 DIAGNOSIS — I10 Essential (primary) hypertension: Secondary | ICD-10-CM

## 2023-07-02 DIAGNOSIS — Z79899 Other long term (current) drug therapy: Secondary | ICD-10-CM

## 2023-07-02 DIAGNOSIS — K222 Esophageal obstruction: Secondary | ICD-10-CM

## 2023-07-02 DIAGNOSIS — C50412 Malignant neoplasm of upper-outer quadrant of left female breast: Secondary | ICD-10-CM

## 2023-07-02 DIAGNOSIS — C3410 Malignant neoplasm of upper lobe, unspecified bronchus or lung: Secondary | ICD-10-CM

## 2023-07-02 DIAGNOSIS — R8271 Bacteriuria: Secondary | ICD-10-CM

## 2023-07-02 DIAGNOSIS — R7303 Prediabetes: Secondary | ICD-10-CM

## 2023-07-02 DIAGNOSIS — R413 Other amnesia: Secondary | ICD-10-CM

## 2023-07-02 DIAGNOSIS — J45991 Cough variant asthma: Secondary | ICD-10-CM

## 2023-07-02 DIAGNOSIS — D72829 Elevated white blood cell count, unspecified: Secondary | ICD-10-CM

## 2023-07-02 DIAGNOSIS — E559 Vitamin D deficiency, unspecified: Secondary | ICD-10-CM

## 2023-07-02 DIAGNOSIS — I4819 Other persistent atrial fibrillation: Secondary | ICD-10-CM

## 2023-07-02 DIAGNOSIS — J90 Pleural effusion, not elsewhere classified: Secondary | ICD-10-CM

## 2023-07-02 DIAGNOSIS — I4891 Unspecified atrial fibrillation: Secondary | ICD-10-CM

## 2023-07-02 DIAGNOSIS — M1990 Unspecified osteoarthritis, unspecified site: Secondary | ICD-10-CM

## 2023-07-02 DIAGNOSIS — I5032 Chronic diastolic (congestive) heart failure: Secondary | ICD-10-CM

## 2023-07-02 DIAGNOSIS — E042 Nontoxic multinodular goiter: Secondary | ICD-10-CM

## 2023-07-02 DIAGNOSIS — M810 Age-related osteoporosis without current pathological fracture: Secondary | ICD-10-CM

## 2023-07-02 DIAGNOSIS — Z95828 Presence of other vascular implants and grafts: Secondary | ICD-10-CM

## 2023-07-02 DIAGNOSIS — I6389 Other cerebral infarction: Secondary | ICD-10-CM

## 2023-07-02 DIAGNOSIS — E032 Hypothyroidism due to medicaments and other exogenous substances: Secondary | ICD-10-CM

## 2023-07-02 DIAGNOSIS — R053 Chronic cough: Secondary | ICD-10-CM

## 2023-07-02 DIAGNOSIS — I5033 Acute on chronic diastolic (congestive) heart failure: Secondary | ICD-10-CM

## 2023-07-02 DIAGNOSIS — I351 Nonrheumatic aortic (valve) insufficiency: Secondary | ICD-10-CM

## 2023-07-02 LAB — CBC WITH DIFFERENTIAL (CANCER CENTER ONLY)
Abs Immature Granulocytes: 0.02 10*3/uL (ref 0.00–0.07)
Basophils Absolute: 0 10*3/uL (ref 0.0–0.1)
Basophils Relative: 0 %
Eosinophils Absolute: 0 10*3/uL (ref 0.0–0.5)
Eosinophils Relative: 0 %
HCT: 36.4 % (ref 36.0–46.0)
Hemoglobin: 12.1 g/dL (ref 12.0–15.0)
Immature Granulocytes: 0 %
Lymphocytes Relative: 16 %
Lymphs Abs: 0.7 10*3/uL (ref 0.7–4.0)
MCH: 34.6 pg — ABNORMAL HIGH (ref 26.0–34.0)
MCHC: 33.2 g/dL (ref 30.0–36.0)
MCV: 104 fL — ABNORMAL HIGH (ref 80.0–100.0)
Monocytes Absolute: 0.7 10*3/uL (ref 0.1–1.0)
Monocytes Relative: 16 %
Neutro Abs: 3.1 10*3/uL (ref 1.7–7.7)
Neutrophils Relative %: 68 %
Platelet Count: 135 10*3/uL — ABNORMAL LOW (ref 150–400)
RBC: 3.5 MIL/uL — ABNORMAL LOW (ref 3.87–5.11)
RDW: 16.8 % — ABNORMAL HIGH (ref 11.5–15.5)
WBC Count: 4.5 10*3/uL (ref 4.0–10.5)
nRBC: 0 % (ref 0.0–0.2)

## 2023-07-02 LAB — IRON AND IRON BINDING CAPACITY (CC-WL,HP ONLY)
Iron: 80 ug/dL (ref 28–170)
Saturation Ratios: 27 % (ref 10.4–31.8)
TIBC: 301 ug/dL (ref 250–450)
UIBC: 221 ug/dL (ref 148–442)

## 2023-07-02 LAB — CMP (CANCER CENTER ONLY)
ALT: 25 U/L (ref 0–44)
AST: 31 U/L (ref 15–41)
Albumin: 3.7 g/dL (ref 3.5–5.0)
Alkaline Phosphatase: 76 U/L (ref 38–126)
Anion gap: 7 (ref 5–15)
BUN: 19 mg/dL (ref 8–23)
CO2: 28 mmol/L (ref 22–32)
Calcium: 9.1 mg/dL (ref 8.9–10.3)
Chloride: 105 mmol/L (ref 98–111)
Creatinine: 0.85 mg/dL (ref 0.44–1.00)
GFR, Estimated: >60 mL/min (ref >60)
Glucose, Bld: 127 mg/dL — ABNORMAL HIGH (ref 70–99)
Potassium: 3.9 mmol/L (ref 3.5–5.1)
Sodium: 140 mmol/L (ref 135–145)
Total Bilirubin: 0.6 mg/dL (ref 0.0–1.2)
Total Protein: 6.1 g/dL — ABNORMAL LOW (ref 6.5–8.1)

## 2023-07-02 LAB — FERRITIN: Ferritin: 172 ng/mL (ref 11–307)

## 2023-07-02 LAB — LACTATE DEHYDROGENASE: LDH: 290 U/L — ABNORMAL HIGH (ref 98–192)

## 2023-07-02 MED ORDER — DEXAMETHASONE 4 MG PO TABS: ORAL_TABLET | ORAL | 1 refills | Status: DC

## 2023-07-02 MED ORDER — DEXTROSE 5 % IV SOLN: Freq: Once | INTRAVENOUS | Status: AC

## 2023-07-02 MED ORDER — DIPHENHYDRAMINE HCL 25 MG PO CAPS
25.0000 mg | ORAL_CAPSULE | Freq: Once | ORAL | Status: AC
  Administered 2023-07-02: 25 mg via ORAL
  Filled 2023-07-02: qty 1

## 2023-07-02 MED ORDER — TRAMADOL HCL 50 MG PO TABS: 50.0000 mg | ORAL_TABLET | Freq: Four times a day (QID) | ORAL | 0 refills | Status: DC | PRN

## 2023-07-02 MED ORDER — HEPARIN SOD (PORK) LOCK FLUSH 100 UNIT/ML IV SOLN
500.0000 [IU] | Freq: Once | INTRAVENOUS | Status: AC | PRN
  Administered 2023-07-02: 500 [IU]

## 2023-07-02 MED ORDER — ACETAMINOPHEN 325 MG PO TABS
650.0000 mg | ORAL_TABLET | Freq: Once | ORAL | Status: AC
  Administered 2023-07-02: 650 mg via ORAL
  Filled 2023-07-02: qty 2

## 2023-07-02 MED ORDER — PALONOSETRON HCL INJECTION 0.25 MG/5ML
0.2500 mg | Freq: Once | INTRAVENOUS | Status: AC
  Administered 2023-07-02: 0.25 mg via INTRAVENOUS
  Filled 2023-07-02: qty 5

## 2023-07-02 MED ORDER — SODIUM CHLORIDE 0.9% FLUSH
10.0000 mL | INTRAVENOUS | Status: DC | PRN
  Administered 2023-07-02: 10 mL

## 2023-07-02 MED ORDER — HEPARIN SOD (PORK) LOCK FLUSH 100 UNIT/ML IV SOLN
250.0000 [IU] | Freq: Once | INTRAVENOUS | Status: DC | PRN
Start: 2023-07-02 — End: 2023-07-02

## 2023-07-02 MED ORDER — SODIUM CHLORIDE 0.9% FLUSH: 3.0000 mL | INTRAVENOUS | Status: DC | PRN

## 2023-07-02 MED ORDER — HEPARIN SOD (PORK) LOCK FLUSH 100 UNIT/ML IV SOLN: 500.0000 [IU] | Freq: Once | INTRAVENOUS | Status: DC

## 2023-07-02 MED ORDER — SODIUM CHLORIDE 0.9% FLUSH
10.0000 mL | INTRAVENOUS | Status: DC | PRN
Start: 2023-07-02 — End: 2023-07-02
  Administered 2023-07-02: 10 mL via INTRAVENOUS

## 2023-07-02 MED ORDER — FAM-TRASTUZUMAB DERUXTECAN-NXKI CHEMO 100 MG IV SOLR
300.0000 mg | Freq: Once | INTRAVENOUS | Status: AC
  Administered 2023-07-02: 300 mg via INTRAVENOUS
  Filled 2023-07-02: qty 15

## 2023-07-02 MED ORDER — ALTEPLASE 2 MG IJ SOLR
2.0000 mg | Freq: Once | INTRAMUSCULAR | Status: DC | PRN
Start: 2023-07-02 — End: 2023-07-02

## 2023-07-02 MED ORDER — SODIUM CHLORIDE 0.9 % IV SOLN
150.0000 mg | Freq: Once | INTRAVENOUS | Status: AC
  Administered 2023-07-02: 150 mg via INTRAVENOUS
  Filled 2023-07-02: qty 5

## 2023-07-02 MED ORDER — DEXAMETHASONE SODIUM PHOSPHATE 10 MG/ML IJ SOLN
10.0000 mg | Freq: Once | INTRAMUSCULAR | Status: AC
  Administered 2023-07-02: 10 mg via INTRAVENOUS
  Filled 2023-07-02: qty 1

## 2023-07-02 NOTE — Progress Notes (Signed)
 Hematology and Oncology Follow Up Visit  Abi Baas [id number] 05-03-1940 83 y.o. 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 Q4-6 months    Interim History:  Ms. Ramel is here today with her husband for follow-up and treatment.  So far, she is doing pretty well.  She has she is going to have radiotherapy for her T11 lesion.  This seemed to start next week.  I suspect she will have 10 treatments.  Her last CA 27.29 is 36.  This is holding pretty steady.  So far, she has done well with the Enhertu .  I really do not have any issues with this.  She has had no problems with cough.  I know she has had the atrial fibrillation.  She seems to be in sinus rhythm right now..  She has had no problems with the Eliquis .  She has had no change in bowel or bladder habits.  There is been no leg swelling.  She has had no rashes.  Overall, I would say performance as about ECOG 2.   Medications:  Allergies as of 07/02/2023       Reactions   Combigan [brimonidine Tartrate-timolol ] Itching   Per patient made eyes red, sore, and sensitivity to light   Sulfa Antibiotics Hives, Rash   Sulfamethoxazole-trimethoprim Hives        Medication List        Accurate as of Jul 02, 2023 10:00 AM. If you have any questions, ask your nurse or doctor.          albuterol  108 (90 Base) MCG/ACT inhaler Commonly known as: VENTOLIN  HFA Inhale 2 puffs into the lungs every 6 (six) hours as needed for wheezing or shortness of breath.    apixaban  5 MG Tabs tablet Commonly known as: Eliquis  Take 1 tablet (5 mg total) by mouth 2 (two) times daily.   atorvastatin  40 MG tablet Commonly known as: LIPITOR TAKE 1 TABLET DAILY BEFORE SUPPER   bimatoprost 0.01 % Soln Commonly known as: LUMIGAN Place 1 drop into both eyes 2 (two) times daily.   calcium -vitamin D  500-200 MG-UNIT tablet Commonly known as: OSCAL WITH D Take 1 tablet by mouth daily with breakfast.   Cerefolin 07-31-48-5 MG Tabs Take 1 tablet by mouth every morning.   dexamethasone  4 MG tablet Commonly known as: DECADRON  Take 2 tablets (8 mg) by mouth daily for 3 days starting the day after chemotherapy. Take with food.   digoxin  0.125 MG tablet Commonly known as: LANOXIN  Take 1 tablet (0.125 mg total) by mouth daily.   donepezil  5 MG tablet Commonly known as: ARICEPT  Take 1 tablet (5 mg total) by mouth at bedtime.   dorzolamide -timolol  2-0.5 % ophthalmic solution Commonly known as: COSOPT  Place 1 drop into both eyes at bedtime.   fluticasone  50 MCG/ACT nasal spray Commonly known as: FLONASE  1 spray each nostril following sinus rinses twice daily   fluticasone -salmeterol 230-21 MCG/ACT inhaler Commonly known as: Advair  HFA USE 2 INHALATIONS TWICE A DAY   folic acid  400 MCG tablet Commonly known  as: FOLVITE  Take 400 mcg by mouth daily.   furosemide  20 MG tablet Commonly known as: LASIX  TAKE 1 TABLET TWICE A DAY. TAKE ADDITIONAL 1 TABLET AS NEEDED FOR SWELLING   gabapentin  400 MG capsule Commonly known as: NEURONTIN  TAKE 1 CAPSULE TWICE A DAY   levETIRAcetam  500 MG 24 hr tablet Commonly known as: KEPPRA  XR TAKE 1 TABLET AT BEDTIME   lidocaine -prilocaine  cream Commonly known as: EMLA  Apply a dime size of cream to port-a-cath 1-2 hours prior to access. Cover with [employer].   loratadine  10 MG tablet Commonly known as: CLARITIN  Take 10 mg by mouth daily.   memantine  10 MG tablet Commonly known as: NAMENDA  TAKE 1 TABLET TWICE A DAY    metoprolol  tartrate 50 MG tablet Commonly known as: LOPRESSOR  Take 1 tablet (50 mg total) by mouth 2 (two) times daily.   Multivitamin/Iron Tabs Take 1 tablet by mouth daily.   ondansetron  8 MG tablet Commonly known as: Zofran  Take 1 tablet (8 mg total) by mouth every 8 (eight) hours as needed for nausea or vomiting. Start on the third day after chemotherapy.   OVER THE COUNTER MEDICATION Take 1 tablet by mouth daily. Zyflamend otc herbal supplement   pantoprazole  40 MG tablet Commonly known as: PROTONIX  TAKE 1 TABLET DAILY   potassium chloride  SA 20 MEQ tablet Commonly known as: KLOR-CON  M Take 1 tablet (20 mEq total) by mouth daily.   Prevagen 10 MG Caps Generic drug: Apoaequorin Take 1 capsule by mouth every morning.   prochlorperazine  10 MG tablet Commonly known as: COMPAZINE  Take 1 tablet (10 mg total) by mouth every 6 (six) hours as needed for nausea or vomiting.   Rhopressa 0.02 % Soln Generic drug: Netarsudil  Dimesylate Place 1 drop into both eyes at bedtime.   thyroid  60 MG tablet Commonly known as: Armour Thyroid  Take 1 tablet (60 mg total) by mouth daily. Takes with 15mg  daily.   thyroid  15 MG tablet Commonly known as: Armour Thyroid  Take 1 tablet (15 mg total) by mouth daily. Take with 60mg  for a total dose of 75mg  daily.   traMADol  50 MG tablet Commonly known as: ULTRAM  Take 1 tablet (50 mg total) by mouth every 6 (six) hours as needed (post op pain).   Vitamin D3 50 MCG (2000 UT) capsule Take 2,000 Units by mouth daily.        Allergies:  Allergies  Allergen Reactions   Combigan [Brimonidine Tartrate-Timolol ] Itching    Per patient made eyes red, sore, and sensitivity to light   Sulfa Antibiotics Hives and Rash   Sulfamethoxazole-Trimethoprim Hives    Past Medical History, Surgical history, Social history, and Family History were reviewed and updated.  Review of Systems: Review of Systems  Constitutional: Negative.   HENT: Negative.     Eyes: Negative.   Respiratory: Negative.    Cardiovascular: Negative.   Gastrointestinal: Negative.   Genitourinary: Negative.   Musculoskeletal: Negative.   Skin: Negative.   Neurological: Negative.   Endo/Heme/Allergies: Negative.   Psychiatric/Behavioral:  Positive for memory loss.      Physical Exam:  height is 5' (1.524 m) and weight is 127 lb (57.6 kg). Her oral temperature is 99.1 F (37.3 C). Her blood pressure is 112/65 and her pulse is 79. Her respiration is 16 and oxygen saturation is 96%.   Wt Readings from Last 3 Encounters:  07/02/23 127 lb (57.6 kg)  06/22/23 129 lb 12.8 oz (58.9 kg)  06/11/23 130 lb (59 kg)  Physical Exam Vitals reviewed.  HENT:     Head: Normocephalic and atraumatic.  Eyes:     Pupils: Pupils are equal, round, and reactive to light.  Cardiovascular:     Rate and Rhythm: Normal rate and regular rhythm.     Heart sounds: Normal heart sounds.  Pulmonary:     Effort: Pulmonary effort is normal.     Breath sounds: Normal breath sounds.  Abdominal:     General: Bowel sounds are normal.     Palpations: Abdomen is soft.  Musculoskeletal:        General: No tenderness or deformity. Normal range of motion.     Cervical back: Normal range of motion.  Lymphadenopathy:     Cervical: No cervical adenopathy.  Skin:    General: Skin is warm and dry.     Findings: No erythema or rash.  Neurological:     Mental Status: She is alert and oriented to person, place, and time.  Psychiatric:        Behavior: Behavior normal.        Thought Content: Thought content normal.        Judgment: Judgment normal.       Lab Results  Component Value Date   WBC 4.5 07/02/2023   HGB 12.1 07/02/2023   HCT 36.4 07/02/2023   MCV 104.0 (H) 07/02/2023   PLT 135 (L) 07/02/2023   Lab Results  Component Value Date   FERRITIN 121 05/21/2023   IRON 80 05/21/2023   TIBC 295 05/21/2023   UIBC 215 05/21/2023   IRONPCTSAT 27 05/21/2023   Lab Results   Component Value Date   RETICCTPCT 5.2 (H) 05/21/2023   RBC 3.50 (L) 07/02/2023   Lab Results  Component Value Date   KPAFRELGTCHN 25.6 (H) 05/30/2021   LAMBDASER 15.6 05/30/2021   KAPLAMBRATIO 1.64 05/30/2021   Lab Results  Component Value Date   IGGSERUM 1,122 05/30/2021   IGA 184 05/30/2021   IGMSERUM 235 (H) 05/30/2021   Lab Results  Component Value Date   TOTALPROTELP 6.6 05/30/2021   ALBUMINELP 3.5 05/30/2021   A1GS 0.2 05/30/2021   A2GS 0.8 05/30/2021   BETS 0.9 05/30/2021   GAMS 1.2 05/30/2021   MSPIKE Not Observed 05/30/2021     Chemistry      Component Value Date/Time   NA 140 07/02/2023 0908   NA 142 12/24/2022 0925   NA 141 09/22/2016 1459   K 3.9 07/02/2023 0908   K 3.8 09/22/2016 1459   CL 105 07/02/2023 0908   CO2 28 07/02/2023 0908   CO2 26 09/22/2016 1459   BUN 19 07/02/2023 0908   BUN 35 (H) 12/24/2022 0925   BUN 19.8 09/22/2016 1459   CREATININE 0.85 07/02/2023 0908   CREATININE 1.1 09/22/2016 1459      Component Value Date/Time   CALCIUM  9.1 07/02/2023 0908   CALCIUM  9.2 09/22/2016 1459   ALKPHOS 76 07/02/2023 0908   ALKPHOS 98 09/22/2016 1459   AST 31 07/02/2023 0908   AST 15 09/22/2016 1459   ALT 25 07/02/2023 0908   ALT 21 09/22/2016 1459   BILITOT 0.6 07/02/2023 0908   BILITOT 0.65 09/22/2016 1459       Impression and Plan: Ms. Gentil is a very nice 83 yo postmenopausal caucasian female with a solitary metastatic focus from breast cancer.  Her initial breast cancer was probably 7 years ago and was estrogen positive. 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  The HER2 was equivocal  on initial staining.  We will continue the Enhertu  for right now.  Again I think that her radiotherapy is a very good idea for her spine.  She is due for the Xgeva  in June.  I will plan to see her back in another 3 weeks.    Ivor Mars, MD 5/2/202510:00 AM

## 2023-07-02 NOTE — Patient Instructions (Signed)

## 2023-07-03 LAB — CANCER ANTIGEN 27.29: CA 27.29: 42.1 U/mL — ABNORMAL HIGH (ref 0.0–38.6)

## 2023-07-05 ENCOUNTER — Ambulatory Visit
Admission: RE | Admit: 2023-07-05 | Discharge: 2023-07-05 | Disposition: A | Discharge status: Home / Self Care | Source: Ambulatory Visit | Attending: Radiation Oncology | Admitting: Radiation Oncology

## 2023-07-05 DIAGNOSIS — Z170 Estrogen receptor positive status [ER+]: Secondary | ICD-10-CM | POA: Diagnosis not present

## 2023-07-05 DIAGNOSIS — C7951 Secondary malignant neoplasm of bone: Secondary | ICD-10-CM | POA: Insufficient documentation

## 2023-07-05 DIAGNOSIS — Z510 Encounter for antineoplastic radiation therapy: Secondary | ICD-10-CM | POA: Diagnosis not present

## 2023-07-05 DIAGNOSIS — C50412 Malignant neoplasm of upper-outer quadrant of left female breast: Secondary | ICD-10-CM | POA: Diagnosis not present

## 2023-07-06 DIAGNOSIS — Z170 Estrogen receptor positive status [ER+]: Secondary | ICD-10-CM | POA: Diagnosis not present

## 2023-07-06 DIAGNOSIS — C50412 Malignant neoplasm of upper-outer quadrant of left female breast: Secondary | ICD-10-CM | POA: Diagnosis not present

## 2023-07-06 DIAGNOSIS — Z510 Encounter for antineoplastic radiation therapy: Secondary | ICD-10-CM | POA: Diagnosis not present

## 2023-07-06 DIAGNOSIS — C7951 Secondary malignant neoplasm of bone: Secondary | ICD-10-CM | POA: Diagnosis not present

## 2023-07-07 ENCOUNTER — Ambulatory Visit: Admitting: Radiation Oncology

## 2023-07-08 ENCOUNTER — Other Ambulatory Visit: Payer: Medicare Other

## 2023-07-08 ENCOUNTER — Ambulatory Visit
Admission: RE | Admit: 2023-07-08 | Discharge: 2023-07-08 | Disposition: A | Discharge status: Home / Self Care | Source: Ambulatory Visit | Attending: Radiation Oncology | Admitting: Radiation Oncology

## 2023-07-08 ENCOUNTER — Other Ambulatory Visit: Payer: Self-pay

## 2023-07-08 DIAGNOSIS — C50412 Malignant neoplasm of upper-outer quadrant of left female breast: Secondary | ICD-10-CM | POA: Diagnosis not present

## 2023-07-08 DIAGNOSIS — C7951 Secondary malignant neoplasm of bone: Secondary | ICD-10-CM | POA: Diagnosis not present

## 2023-07-08 DIAGNOSIS — Z170 Estrogen receptor positive status [ER+]: Secondary | ICD-10-CM | POA: Diagnosis not present

## 2023-07-08 DIAGNOSIS — Z510 Encounter for antineoplastic radiation therapy: Secondary | ICD-10-CM | POA: Diagnosis not present

## 2023-07-08 LAB — RAD ONC ARIA SESSION SUMMARY
Course Elapsed Days: 0
Plan Fractions Treated to Date: 1
Plan Prescribed Dose Per Fraction: 3 Gy
Plan Total Fractions Prescribed: 10
Plan Total Prescribed Dose: 30 Gy
Reference Point Dosage Given to Date: 3 Gy
Reference Point Session Dosage Given: 3 Gy
Session Number: 1

## 2023-07-09 ENCOUNTER — Other Ambulatory Visit: Payer: Self-pay

## 2023-07-09 ENCOUNTER — Ambulatory Visit
Admission: RE | Admit: 2023-07-09 | Discharge: 2023-07-09 | Disposition: A | Discharge status: Home / Self Care | Source: Ambulatory Visit | Attending: Radiation Oncology | Admitting: Radiation Oncology

## 2023-07-09 DIAGNOSIS — Z170 Estrogen receptor positive status [ER+]: Secondary | ICD-10-CM | POA: Diagnosis not present

## 2023-07-09 DIAGNOSIS — C50412 Malignant neoplasm of upper-outer quadrant of left female breast: Secondary | ICD-10-CM | POA: Diagnosis not present

## 2023-07-09 DIAGNOSIS — C7951 Secondary malignant neoplasm of bone: Secondary | ICD-10-CM | POA: Diagnosis not present

## 2023-07-09 DIAGNOSIS — Z510 Encounter for antineoplastic radiation therapy: Secondary | ICD-10-CM | POA: Diagnosis not present

## 2023-07-09 LAB — RAD ONC ARIA SESSION SUMMARY
Course Elapsed Days: 1
Plan Fractions Treated to Date: 2
Plan Prescribed Dose Per Fraction: 3 Gy
Plan Total Fractions Prescribed: 10
Plan Total Prescribed Dose: 30 Gy
Reference Point Dosage Given to Date: 6 Gy
Reference Point Session Dosage Given: 3 Gy
Session Number: 2

## 2023-07-12 ENCOUNTER — Other Ambulatory Visit: Payer: Self-pay

## 2023-07-12 ENCOUNTER — Ambulatory Visit
Admission: RE | Admit: 2023-07-12 | Discharge: 2023-07-12 | Disposition: A | Discharge status: Home / Self Care | Source: Ambulatory Visit | Attending: Radiation Oncology | Admitting: Radiation Oncology

## 2023-07-12 DIAGNOSIS — C50412 Malignant neoplasm of upper-outer quadrant of left female breast: Secondary | ICD-10-CM | POA: Diagnosis not present

## 2023-07-12 DIAGNOSIS — Z170 Estrogen receptor positive status [ER+]: Secondary | ICD-10-CM | POA: Diagnosis not present

## 2023-07-12 DIAGNOSIS — C7951 Secondary malignant neoplasm of bone: Secondary | ICD-10-CM | POA: Diagnosis not present

## 2023-07-12 DIAGNOSIS — Z510 Encounter for antineoplastic radiation therapy: Secondary | ICD-10-CM | POA: Diagnosis not present

## 2023-07-12 LAB — RAD ONC ARIA SESSION SUMMARY
Course Elapsed Days: 4
Plan Fractions Treated to Date: 3
Plan Prescribed Dose Per Fraction: 3 Gy
Plan Total Fractions Prescribed: 10
Plan Total Prescribed Dose: 30 Gy
Reference Point Dosage Given to Date: 9 Gy
Reference Point Session Dosage Given: 3 Gy
Session Number: 3

## 2023-07-13 ENCOUNTER — Ambulatory Visit
Admission: RE | Admit: 2023-07-13 | Discharge: 2023-07-13 | Disposition: A | Discharge status: Home / Self Care | Source: Ambulatory Visit | Attending: Radiation Oncology

## 2023-07-13 ENCOUNTER — Other Ambulatory Visit: Payer: Self-pay

## 2023-07-13 DIAGNOSIS — Z170 Estrogen receptor positive status [ER+]: Secondary | ICD-10-CM | POA: Diagnosis not present

## 2023-07-13 DIAGNOSIS — C50412 Malignant neoplasm of upper-outer quadrant of left female breast: Secondary | ICD-10-CM | POA: Diagnosis not present

## 2023-07-13 DIAGNOSIS — C7951 Secondary malignant neoplasm of bone: Secondary | ICD-10-CM | POA: Diagnosis not present

## 2023-07-13 DIAGNOSIS — Z510 Encounter for antineoplastic radiation therapy: Secondary | ICD-10-CM | POA: Diagnosis not present

## 2023-07-13 LAB — RAD ONC ARIA SESSION SUMMARY
Course Elapsed Days: 5
Plan Fractions Treated to Date: 4
Plan Prescribed Dose Per Fraction: 3 Gy
Plan Total Fractions Prescribed: 10
Plan Total Prescribed Dose: 30 Gy
Reference Point Dosage Given to Date: 12 Gy
Reference Point Session Dosage Given: 3 Gy
Session Number: 4

## 2023-07-14 ENCOUNTER — Ambulatory Visit
Admission: RE | Admit: 2023-07-14 | Discharge: 2023-07-14 | Disposition: A | Discharge status: Home / Self Care | Source: Ambulatory Visit | Attending: Radiation Oncology

## 2023-07-14 ENCOUNTER — Other Ambulatory Visit: Payer: Self-pay

## 2023-07-14 DIAGNOSIS — Z170 Estrogen receptor positive status [ER+]: Secondary | ICD-10-CM | POA: Diagnosis not present

## 2023-07-14 DIAGNOSIS — Z510 Encounter for antineoplastic radiation therapy: Secondary | ICD-10-CM | POA: Diagnosis not present

## 2023-07-14 DIAGNOSIS — C50412 Malignant neoplasm of upper-outer quadrant of left female breast: Secondary | ICD-10-CM | POA: Diagnosis not present

## 2023-07-14 DIAGNOSIS — C7951 Secondary malignant neoplasm of bone: Secondary | ICD-10-CM | POA: Diagnosis not present

## 2023-07-14 LAB — RAD ONC ARIA SESSION SUMMARY
Course Elapsed Days: 6
Plan Fractions Treated to Date: 5
Plan Prescribed Dose Per Fraction: 3 Gy
Plan Total Fractions Prescribed: 10
Plan Total Prescribed Dose: 30 Gy
Reference Point Dosage Given to Date: 15 Gy
Reference Point Session Dosage Given: 3 Gy
Session Number: 5

## 2023-07-15 ENCOUNTER — Other Ambulatory Visit: Payer: Medicare Other

## 2023-07-15 ENCOUNTER — Ambulatory Visit
Admission: RE | Admit: 2023-07-15 | Discharge: 2023-07-15 | Disposition: A | Discharge status: Home / Self Care | Source: Ambulatory Visit | Attending: Radiation Oncology

## 2023-07-15 ENCOUNTER — Other Ambulatory Visit: Payer: Self-pay

## 2023-07-15 DIAGNOSIS — C7951 Secondary malignant neoplasm of bone: Secondary | ICD-10-CM | POA: Diagnosis not present

## 2023-07-15 DIAGNOSIS — C50412 Malignant neoplasm of upper-outer quadrant of left female breast: Secondary | ICD-10-CM | POA: Diagnosis not present

## 2023-07-15 DIAGNOSIS — Z510 Encounter for antineoplastic radiation therapy: Secondary | ICD-10-CM | POA: Diagnosis not present

## 2023-07-15 DIAGNOSIS — Z170 Estrogen receptor positive status [ER+]: Secondary | ICD-10-CM | POA: Diagnosis not present

## 2023-07-15 LAB — RAD ONC ARIA SESSION SUMMARY
Course Elapsed Days: 7
Plan Fractions Treated to Date: 6
Plan Prescribed Dose Per Fraction: 3 Gy
Plan Total Fractions Prescribed: 10
Plan Total Prescribed Dose: 30 Gy
Reference Point Dosage Given to Date: 18 Gy
Reference Point Session Dosage Given: 3 Gy
Session Number: 6

## 2023-07-16 ENCOUNTER — Ambulatory Visit
Admission: RE | Admit: 2023-07-16 | Discharge: 2023-07-16 | Disposition: A | Discharge status: Home / Self Care | Source: Ambulatory Visit | Attending: Radiation Oncology | Admitting: Radiation Oncology

## 2023-07-16 ENCOUNTER — Other Ambulatory Visit: Payer: Self-pay

## 2023-07-16 DIAGNOSIS — C7951 Secondary malignant neoplasm of bone: Secondary | ICD-10-CM | POA: Diagnosis not present

## 2023-07-16 DIAGNOSIS — C50412 Malignant neoplasm of upper-outer quadrant of left female breast: Secondary | ICD-10-CM | POA: Diagnosis not present

## 2023-07-16 DIAGNOSIS — Z170 Estrogen receptor positive status [ER+]: Secondary | ICD-10-CM | POA: Diagnosis not present

## 2023-07-16 DIAGNOSIS — Z510 Encounter for antineoplastic radiation therapy: Secondary | ICD-10-CM | POA: Diagnosis not present

## 2023-07-16 LAB — RAD ONC ARIA SESSION SUMMARY
Course Elapsed Days: 8
Plan Fractions Treated to Date: 7
Plan Prescribed Dose Per Fraction: 3 Gy
Plan Total Fractions Prescribed: 10
Plan Total Prescribed Dose: 30 Gy
Reference Point Dosage Given to Date: 21 Gy
Reference Point Session Dosage Given: 3 Gy
Session Number: 7

## 2023-07-19 ENCOUNTER — Ambulatory Visit
Admission: RE | Admit: 2023-07-19 | Discharge: 2023-07-19 | Disposition: A | Discharge status: Home / Self Care | Source: Ambulatory Visit | Attending: Radiation Oncology | Admitting: Radiation Oncology

## 2023-07-19 ENCOUNTER — Other Ambulatory Visit: Payer: Self-pay

## 2023-07-19 DIAGNOSIS — Z510 Encounter for antineoplastic radiation therapy: Secondary | ICD-10-CM | POA: Diagnosis not present

## 2023-07-19 DIAGNOSIS — Z170 Estrogen receptor positive status [ER+]: Secondary | ICD-10-CM | POA: Diagnosis not present

## 2023-07-19 DIAGNOSIS — C50412 Malignant neoplasm of upper-outer quadrant of left female breast: Secondary | ICD-10-CM | POA: Diagnosis not present

## 2023-07-19 DIAGNOSIS — C7951 Secondary malignant neoplasm of bone: Secondary | ICD-10-CM | POA: Diagnosis not present

## 2023-07-19 LAB — RAD ONC ARIA SESSION SUMMARY
Course Elapsed Days: 11
Plan Fractions Treated to Date: 8
Plan Prescribed Dose Per Fraction: 3 Gy
Plan Total Fractions Prescribed: 10
Plan Total Prescribed Dose: 30 Gy
Reference Point Dosage Given to Date: 24 Gy
Reference Point Session Dosage Given: 3 Gy
Session Number: 8

## 2023-07-20 ENCOUNTER — Ambulatory Visit

## 2023-07-20 ENCOUNTER — Ambulatory Visit
Admission: RE | Admit: 2023-07-20 | Discharge: 2023-07-20 | Disposition: A | Discharge status: Home / Self Care | Source: Ambulatory Visit | Attending: Radiation Oncology | Admitting: Radiation Oncology

## 2023-07-20 ENCOUNTER — Other Ambulatory Visit: Payer: Self-pay

## 2023-07-20 DIAGNOSIS — C50412 Malignant neoplasm of upper-outer quadrant of left female breast: Secondary | ICD-10-CM | POA: Diagnosis not present

## 2023-07-20 DIAGNOSIS — C7951 Secondary malignant neoplasm of bone: Secondary | ICD-10-CM | POA: Diagnosis not present

## 2023-07-20 DIAGNOSIS — Z510 Encounter for antineoplastic radiation therapy: Secondary | ICD-10-CM | POA: Diagnosis not present

## 2023-07-20 DIAGNOSIS — Z170 Estrogen receptor positive status [ER+]: Secondary | ICD-10-CM | POA: Diagnosis not present

## 2023-07-20 LAB — RAD ONC ARIA SESSION SUMMARY
Course Elapsed Days: 12
Plan Fractions Treated to Date: 9
Plan Prescribed Dose Per Fraction: 3 Gy
Plan Total Fractions Prescribed: 10
Plan Total Prescribed Dose: 30 Gy
Reference Point Dosage Given to Date: 27 Gy
Reference Point Session Dosage Given: 3 Gy
Session Number: 9

## 2023-07-21 ENCOUNTER — Other Ambulatory Visit: Payer: Self-pay

## 2023-07-21 ENCOUNTER — Ambulatory Visit
Admission: RE | Admit: 2023-07-21 | Discharge: 2023-07-21 | Disposition: A | Discharge status: Home / Self Care | Source: Ambulatory Visit | Attending: Radiation Oncology | Admitting: Radiation Oncology

## 2023-07-21 DIAGNOSIS — C50412 Malignant neoplasm of upper-outer quadrant of left female breast: Secondary | ICD-10-CM | POA: Diagnosis not present

## 2023-07-21 DIAGNOSIS — C7951 Secondary malignant neoplasm of bone: Secondary | ICD-10-CM | POA: Diagnosis not present

## 2023-07-21 DIAGNOSIS — Z510 Encounter for antineoplastic radiation therapy: Secondary | ICD-10-CM | POA: Diagnosis not present

## 2023-07-21 DIAGNOSIS — Z170 Estrogen receptor positive status [ER+]: Secondary | ICD-10-CM | POA: Diagnosis not present

## 2023-07-21 LAB — RAD ONC ARIA SESSION SUMMARY
Course Elapsed Days: 13
Plan Fractions Treated to Date: 10
Plan Prescribed Dose Per Fraction: 3 Gy
Plan Total Fractions Prescribed: 10
Plan Total Prescribed Dose: 30 Gy
Reference Point Dosage Given to Date: 30 Gy
Reference Point Session Dosage Given: 3 Gy
Session Number: 10

## 2023-07-22 ENCOUNTER — Ambulatory Visit: Payer: Medicare Other | Admitting: Family Medicine

## 2023-07-23 ENCOUNTER — Encounter: Payer: Self-pay | Admitting: Hematology & Oncology

## 2023-07-23 ENCOUNTER — Inpatient Hospital Stay

## 2023-07-23 ENCOUNTER — Inpatient Hospital Stay (HOSPITAL_BASED_OUTPATIENT_CLINIC_OR_DEPARTMENT_OTHER): Admitting: Hematology & Oncology

## 2023-07-23 VITALS — BP 125/71 | HR 78 | Temp 97.9°F | Resp 32 | Ht 60.0 in | Wt 124.1 lb

## 2023-07-23 DIAGNOSIS — R4182 Altered mental status, unspecified: Secondary | ICD-10-CM

## 2023-07-23 DIAGNOSIS — H401111 Primary open-angle glaucoma, right eye, mild stage: Secondary | ICD-10-CM

## 2023-07-23 DIAGNOSIS — I4891 Unspecified atrial fibrillation: Secondary | ICD-10-CM

## 2023-07-23 DIAGNOSIS — D72829 Elevated white blood cell count, unspecified: Secondary | ICD-10-CM

## 2023-07-23 DIAGNOSIS — R8271 Bacteriuria: Secondary | ICD-10-CM

## 2023-07-23 DIAGNOSIS — M1990 Unspecified osteoarthritis, unspecified site: Secondary | ICD-10-CM

## 2023-07-23 DIAGNOSIS — I5033 Acute on chronic diastolic (congestive) heart failure: Secondary | ICD-10-CM

## 2023-07-23 DIAGNOSIS — I340 Nonrheumatic mitral (valve) insufficiency: Secondary | ICD-10-CM

## 2023-07-23 DIAGNOSIS — Z0000 Encounter for general adult medical examination without abnormal findings: Secondary | ICD-10-CM

## 2023-07-23 DIAGNOSIS — E032 Hypothyroidism due to medicaments and other exogenous substances: Secondary | ICD-10-CM

## 2023-07-23 DIAGNOSIS — E559 Vitamin D deficiency, unspecified: Secondary | ICD-10-CM

## 2023-07-23 DIAGNOSIS — C50412 Malignant neoplasm of upper-outer quadrant of left female breast: Secondary | ICD-10-CM

## 2023-07-23 DIAGNOSIS — Z5112 Encounter for antineoplastic immunotherapy: Secondary | ICD-10-CM | POA: Diagnosis not present

## 2023-07-23 DIAGNOSIS — J385 Laryngeal spasm: Secondary | ICD-10-CM

## 2023-07-23 DIAGNOSIS — E042 Nontoxic multinodular goiter: Secondary | ICD-10-CM

## 2023-07-23 DIAGNOSIS — R053 Chronic cough: Secondary | ICD-10-CM

## 2023-07-23 DIAGNOSIS — K5730 Diverticulosis of large intestine without perforation or abscess without bleeding: Secondary | ICD-10-CM

## 2023-07-23 DIAGNOSIS — I639 Cerebral infarction, unspecified: Secondary | ICD-10-CM

## 2023-07-23 DIAGNOSIS — Z124 Encounter for screening for malignant neoplasm of cervix: Secondary | ICD-10-CM

## 2023-07-23 DIAGNOSIS — M898X5 Other specified disorders of bone, thigh: Secondary | ICD-10-CM

## 2023-07-23 DIAGNOSIS — I272 Pulmonary hypertension, unspecified: Secondary | ICD-10-CM

## 2023-07-23 DIAGNOSIS — C7951 Secondary malignant neoplasm of bone: Secondary | ICD-10-CM

## 2023-07-23 DIAGNOSIS — Z79899 Other long term (current) drug therapy: Secondary | ICD-10-CM

## 2023-07-23 DIAGNOSIS — H401122 Primary open-angle glaucoma, left eye, moderate stage: Secondary | ICD-10-CM

## 2023-07-23 DIAGNOSIS — Z170 Estrogen receptor positive status [ER+]: Secondary | ICD-10-CM

## 2023-07-23 DIAGNOSIS — J90 Pleural effusion, not elsewhere classified: Secondary | ICD-10-CM

## 2023-07-23 DIAGNOSIS — J810 Acute pulmonary edema: Secondary | ICD-10-CM

## 2023-07-23 DIAGNOSIS — I10 Essential (primary) hypertension: Secondary | ICD-10-CM

## 2023-07-23 DIAGNOSIS — I517 Cardiomegaly: Secondary | ICD-10-CM

## 2023-07-23 DIAGNOSIS — G40909 Epilepsy, unspecified, not intractable, without status epilepticus: Secondary | ICD-10-CM

## 2023-07-23 DIAGNOSIS — R7303 Prediabetes: Secondary | ICD-10-CM

## 2023-07-23 DIAGNOSIS — C50912 Malignant neoplasm of unspecified site of left female breast: Secondary | ICD-10-CM | POA: Diagnosis not present

## 2023-07-23 DIAGNOSIS — M8580 Other specified disorders of bone density and structure, unspecified site: Secondary | ICD-10-CM

## 2023-07-23 DIAGNOSIS — M810 Age-related osteoporosis without current pathological fracture: Secondary | ICD-10-CM

## 2023-07-23 DIAGNOSIS — K219 Gastro-esophageal reflux disease without esophagitis: Secondary | ICD-10-CM

## 2023-07-23 DIAGNOSIS — Z8673 Personal history of transient ischemic attack (TIA), and cerebral infarction without residual deficits: Secondary | ICD-10-CM

## 2023-07-23 DIAGNOSIS — K222 Esophageal obstruction: Secondary | ICD-10-CM

## 2023-07-23 DIAGNOSIS — I4819 Other persistent atrial fibrillation: Secondary | ICD-10-CM

## 2023-07-23 DIAGNOSIS — N1831 Chronic kidney disease, stage 3a: Secondary | ICD-10-CM

## 2023-07-23 DIAGNOSIS — I5032 Chronic diastolic (congestive) heart failure: Secondary | ICD-10-CM

## 2023-07-23 DIAGNOSIS — I351 Nonrheumatic aortic (valve) insufficiency: Secondary | ICD-10-CM

## 2023-07-23 DIAGNOSIS — R413 Other amnesia: Secondary | ICD-10-CM

## 2023-07-23 DIAGNOSIS — N951 Menopausal and female climacteric states: Secondary | ICD-10-CM

## 2023-07-23 DIAGNOSIS — Z85118 Personal history of other malignant neoplasm of bronchus and lung: Secondary | ICD-10-CM

## 2023-07-23 DIAGNOSIS — R942 Abnormal results of pulmonary function studies: Secondary | ICD-10-CM

## 2023-07-23 DIAGNOSIS — C3410 Malignant neoplasm of upper lobe, unspecified bronchus or lung: Secondary | ICD-10-CM

## 2023-07-23 DIAGNOSIS — J45991 Cough variant asthma: Secondary | ICD-10-CM

## 2023-07-23 DIAGNOSIS — I6389 Other cerebral infarction: Secondary | ICD-10-CM

## 2023-07-23 LAB — CMP (CANCER CENTER ONLY)
ALT: 21 U/L (ref 0–44)
AST: 30 U/L (ref 15–41)
Albumin: 3.8 g/dL (ref 3.5–5.0)
Alkaline Phosphatase: 76 U/L (ref 38–126)
Anion gap: 8 (ref 5–15)
BUN: 19 mg/dL (ref 8–23)
CO2: 27 mmol/L (ref 22–32)
Calcium: 8.9 mg/dL (ref 8.9–10.3)
Chloride: 105 mmol/L (ref 98–111)
Creatinine: 0.76 mg/dL (ref 0.44–1.00)
GFR, Estimated: >60 mL/min (ref >60)
Glucose, Bld: 114 mg/dL — ABNORMAL HIGH (ref 70–99)
Potassium: 3.8 mmol/L (ref 3.5–5.1)
Sodium: 140 mmol/L (ref 135–145)
Total Bilirubin: 0.6 mg/dL (ref 0.0–1.2)
Total Protein: 5.8 g/dL — ABNORMAL LOW (ref 6.5–8.1)

## 2023-07-23 LAB — CBC WITH DIFFERENTIAL (CANCER CENTER ONLY)
Abs Immature Granulocytes: 0.03 10*3/uL (ref 0.00–0.07)
Basophils Absolute: 0 10*3/uL (ref 0.0–0.1)
Basophils Relative: 1 %
Eosinophils Absolute: 0 10*3/uL (ref 0.0–0.5)
Eosinophils Relative: 0 %
HCT: 36.2 % (ref 36.0–46.0)
Hemoglobin: 12.1 g/dL (ref 12.0–15.0)
Immature Granulocytes: 1 %
Lymphocytes Relative: 6 %
Lymphs Abs: 0.2 10*3/uL — ABNORMAL LOW (ref 0.7–4.0)
MCH: 34.7 pg — ABNORMAL HIGH (ref 26.0–34.0)
MCHC: 33.4 g/dL (ref 30.0–36.0)
MCV: 103.7 fL — ABNORMAL HIGH (ref 80.0–100.0)
Monocytes Absolute: 0.7 10*3/uL (ref 0.1–1.0)
Monocytes Relative: 18 %
Neutro Abs: 2.7 10*3/uL (ref 1.7–7.7)
Neutrophils Relative %: 74 %
Platelet Count: 116 10*3/uL — ABNORMAL LOW (ref 150–400)
RBC: 3.49 MIL/uL — ABNORMAL LOW (ref 3.87–5.11)
RDW: 17.2 % — ABNORMAL HIGH (ref 11.5–15.5)
WBC Count: 3.7 10*3/uL — ABNORMAL LOW (ref 4.0–10.5)
nRBC: 0 % (ref 0.0–0.2)

## 2023-07-23 MED ORDER — PALONOSETRON HCL INJECTION 0.25 MG/5ML
0.2500 mg | Freq: Once | INTRAVENOUS | Status: AC
  Administered 2023-07-23: 0.25 mg via INTRAVENOUS
  Filled 2023-07-23: qty 5

## 2023-07-23 MED ORDER — DEXAMETHASONE SODIUM PHOSPHATE 10 MG/ML IJ SOLN
10.0000 mg | Freq: Once | INTRAMUSCULAR | Status: AC
  Administered 2023-07-23: 10 mg via INTRAVENOUS
  Filled 2023-07-23: qty 1

## 2023-07-23 MED ORDER — HEPARIN SOD (PORK) LOCK FLUSH 100 UNIT/ML IV SOLN
500.0000 [IU] | Freq: Once | INTRAVENOUS | Status: AC | PRN
  Administered 2023-07-23: 500 [IU]

## 2023-07-23 MED ORDER — DIPHENHYDRAMINE HCL 25 MG PO CAPS
25.0000 mg | ORAL_CAPSULE | Freq: Once | ORAL | Status: AC
  Administered 2023-07-23: 25 mg via ORAL
  Filled 2023-07-23: qty 1

## 2023-07-23 MED ORDER — SODIUM CHLORIDE 0.9 % IV SOLN
150.0000 mg | Freq: Once | INTRAVENOUS | Status: AC
  Administered 2023-07-23: 150 mg via INTRAVENOUS
  Filled 2023-07-23: qty 150

## 2023-07-23 MED ORDER — DEXTROSE 5 % IV SOLN: Freq: Once | INTRAVENOUS | Status: AC

## 2023-07-23 MED ORDER — FAM-TRASTUZUMAB DERUXTECAN-NXKI CHEMO 100 MG IV SOLR
300.0000 mg | Freq: Once | INTRAVENOUS | Status: AC
  Administered 2023-07-23: 300 mg via INTRAVENOUS
  Filled 2023-07-23: qty 15

## 2023-07-23 MED ORDER — ACETAMINOPHEN 325 MG PO TABS
650.0000 mg | ORAL_TABLET | Freq: Once | ORAL | Status: AC
  Administered 2023-07-23: 650 mg via ORAL
  Filled 2023-07-23: qty 2

## 2023-07-23 MED ORDER — SODIUM CHLORIDE 0.9% FLUSH
10.0000 mL | INTRAVENOUS | Status: DC | PRN
  Administered 2023-07-23: 10 mL

## 2023-07-23 NOTE — Progress Notes (Signed)
 Hematology and Oncology Follow Up Visit  Patricia [name] [id number] 1941-01-06 83 y.o. 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 Q4-6 months    Interim History:  Patricia [name] is here today with her husband for follow-up and treatment.  She completed the radiotherapy to her T11 vertebral body.  This was 2 days ago.  Hopefully, this will help.  She is complaining little bit of pain in the right hip.  I am not sure exactly what this might be.  Will have to just watch this.  Her last CA 27.29 was up a little bit.  It was 42.1.  She has had no problems with cough.  There is no shortness of breath.  Her appetite is doing okay.  She has had no change in bowel or bladder habits.  She has had no leg swelling.  There is no rashes.  She has had no bleeding.  She has had no headache.  She is on anticoagulation.  She does have history of atrial fibrillation.  Overall, I would say that her performance status is probably ECOG 2.     Medications:  Allergies as of 07/23/2023       Reactions   Combigan [brimonidine Tartrate-timolol ] Itching   Per patient made eyes red, sore, and sensitivity to light   Sulfa Antibiotics Hives, Rash   Sulfamethoxazole-trimethoprim Hives        Medication List        Accurate as of Jul 23, 2023 10:32 AM. If you have any questions, ask your nurse or doctor.          albuterol  108 (90 Base) MCG/ACT inhaler Commonly known as: VENTOLIN  HFA Inhale 2 puffs into the  lungs every 6 (six) hours as needed for wheezing or shortness of breath.   apixaban  5 MG Tabs tablet Commonly known as: Eliquis  Take 1 tablet (5 mg total) by mouth 2 (two) times daily.   atorvastatin  40 MG tablet Commonly known as: LIPITOR TAKE 1 TABLET DAILY BEFORE SUPPER   bimatoprost 0.01 % Soln Commonly known as: LUMIGAN Place 1 drop into both eyes 2 (two) times daily.   calcium -vitamin D  500-200 MG-UNIT tablet Commonly known as: OSCAL WITH D Take 1 tablet by mouth daily with breakfast.   Cerefolin 07-31-48-5 MG Tabs Take 1 tablet by mouth every morning.   dexamethasone  4 MG tablet Commonly known as: DECADRON  Take 2 tablets (8 mg) by mouth daily for 3 days starting the day after chemotherapy. Take with food.   digoxin  0.125 MG tablet Commonly known as: LANOXIN  Take 1 tablet (0.125 mg total) by mouth daily.   donepezil  5 MG tablet Commonly known as: ARICEPT  Take 1 tablet (5 mg total) by mouth at bedtime.   dorzolamide -timolol  2-0.5 % ophthalmic solution Commonly known as: COSOPT  Place 1 drop into both eyes at bedtime.   fluticasone  50 MCG/ACT nasal spray Commonly known as: FLONASE  1 spray each nostril following sinus rinses twice daily   fluticasone -salmeterol 230-21 MCG/ACT inhaler Commonly known as: Advair  HFA USE 2  INHALATIONS TWICE A DAY   folic acid  400 MCG tablet Commonly known as: FOLVITE  Take 400 mcg by mouth daily.   furosemide  20 MG tablet Commonly known as: LASIX  TAKE 1 TABLET TWICE A DAY. TAKE ADDITIONAL 1 TABLET AS NEEDED FOR SWELLING   gabapentin  400 MG capsule Commonly known as: NEURONTIN  TAKE 1 CAPSULE TWICE A DAY   levETIRAcetam  500 MG 24 hr tablet Commonly known as: KEPPRA  XR TAKE 1 TABLET AT BEDTIME   lidocaine -prilocaine  cream Commonly known as: EMLA  Apply a dime size of cream to port-a-cath 1-2 hours prior to access. Cover with [employer].   loratadine  10 MG tablet Commonly known as: CLARITIN  Take 10 mg by mouth daily.    memantine  10 MG tablet Commonly known as: NAMENDA  TAKE 1 TABLET TWICE A DAY   metoprolol  tartrate 50 MG tablet Commonly known as: LOPRESSOR  Take 1 tablet (50 mg total) by mouth 2 (two) times daily.   Multivitamin/Iron Tabs Take 1 tablet by mouth daily.   ondansetron  8 MG tablet Commonly known as: Zofran  Take 1 tablet (8 mg total) by mouth every 8 (eight) hours as needed for nausea or vomiting. Start on the third day after chemotherapy.   OVER THE COUNTER MEDICATION Take 1 tablet by mouth daily. Zyflamend otc herbal supplement   pantoprazole  40 MG tablet Commonly known as: PROTONIX  TAKE 1 TABLET DAILY   potassium chloride  SA 20 MEQ tablet Commonly known as: KLOR-CON  M Take 1 tablet (20 mEq total) by mouth daily.   Prevagen 10 MG Caps Generic drug: Apoaequorin Take 1 capsule by mouth every morning.   prochlorperazine  10 MG tablet Commonly known as: COMPAZINE  Take 1 tablet (10 mg total) by mouth every 6 (six) hours as needed for nausea or vomiting.   Rhopressa 0.02 % Soln Generic drug: Netarsudil  Dimesylate Place 1 drop into both eyes at bedtime.   thyroid  60 MG tablet Commonly known as: Armour Thyroid  Take 1 tablet (60 mg total) by mouth daily. Takes with 15mg  daily.   thyroid  15 MG tablet Commonly known as: Armour Thyroid  Take 1 tablet (15 mg total) by mouth daily. Take with 60mg  for a total dose of 75mg  daily.   traMADol  50 MG tablet Commonly known as: ULTRAM  Take 1 tablet (50 mg total) by mouth every 6 (six) hours as needed (post op pain).   Vitamin D3 50 MCG (2000 UT) capsule Take 2,000 Units by mouth daily.        Allergies:  Allergies  Allergen Reactions   Combigan [Brimonidine Tartrate-Timolol ] Itching    Per patient made eyes red, sore, and sensitivity to light   Sulfa Antibiotics Hives and Rash   Sulfamethoxazole-Trimethoprim Hives    Past Medical History, Surgical history, Social history, and Family History were reviewed and  updated.  Review of Systems: Review of Systems  Constitutional: Negative.   HENT: Negative.    Eyes: Negative.   Respiratory: Negative.    Cardiovascular: Negative.   Gastrointestinal: Negative.   Genitourinary: Negative.   Musculoskeletal: Negative.   Skin: Negative.   Neurological: Negative.   Endo/Heme/Allergies: Negative.   Psychiatric/Behavioral:  Positive for memory loss.      Physical Exam:  height is 5' (1.524 m) and weight is 124 lb 1.9 oz (56.3 kg). Her oral temperature is 97.9 F (36.6 C). Her blood pressure is 125/71 and her pulse is 78. Her respiration is 32 (abnormal).   Wt Readings from Last 3 Encounters:  07/23/23 124 lb 1.9 oz (56.3 kg)  07/02/23 127  lb (57.6 kg)  06/22/23 129 lb 12.8 oz (58.9 kg)    Physical Exam Vitals reviewed.  HENT:     Head: Normocephalic and atraumatic.  Eyes:     Pupils: Pupils are equal, round, and reactive to light.  Cardiovascular:     Rate and Rhythm: Normal rate and regular rhythm.     Heart sounds: Normal heart sounds.  Pulmonary:     Effort: Pulmonary effort is normal.     Breath sounds: Normal breath sounds.  Abdominal:     General: Bowel sounds are normal.     Palpations: Abdomen is soft.  Musculoskeletal:        General: No tenderness or deformity. Normal range of motion.     Cervical back: Normal range of motion.  Lymphadenopathy:     Cervical: No cervical adenopathy.  Skin:    General: Skin is warm and dry.     Findings: No erythema or rash.  Neurological:     Mental Status: She is alert and oriented to person, place, and time.  Psychiatric:        Behavior: Behavior normal.        Thought Content: Thought content normal.        Judgment: Judgment normal.       Lab Results  Component Value Date   WBC 3.7 (L) 07/23/2023   HGB 12.1 07/23/2023   HCT 36.2 07/23/2023   MCV 103.7 (H) 07/23/2023   PLT 116 (L) 07/23/2023   Lab Results  Component Value Date   FERRITIN 172 07/02/2023   IRON 80  07/02/2023   TIBC 301 07/02/2023   UIBC 221 07/02/2023   IRONPCTSAT 27 07/02/2023   Lab Results  Component Value Date   RETICCTPCT 5.2 (H) 05/21/2023   RBC 3.49 (L) 07/23/2023   Lab Results  Component Value Date   KPAFRELGTCHN 25.6 (H) 05/30/2021   LAMBDASER 15.6 05/30/2021   KAPLAMBRATIO 1.64 05/30/2021   Lab Results  Component Value Date   IGGSERUM 1,122 05/30/2021   IGA 184 05/30/2021   IGMSERUM 235 (H) 05/30/2021   Lab Results  Component Value Date   TOTALPROTELP 6.6 05/30/2021   ALBUMINELP 3.5 05/30/2021   A1GS 0.2 05/30/2021   A2GS 0.8 05/30/2021   BETS 0.9 05/30/2021   GAMS 1.2 05/30/2021   MSPIKE Not Observed 05/30/2021     Chemistry      Component Value Date/Time   NA 140 07/23/2023 0905   NA 142 12/24/2022 0925   NA 141 09/22/2016 1459   K 3.8 07/23/2023 0905   K 3.8 09/22/2016 1459   CL 105 07/23/2023 0905   CO2 27 07/23/2023 0905   CO2 26 09/22/2016 1459   BUN 19 07/23/2023 0905   BUN 35 (H) 12/24/2022 0925   BUN 19.8 09/22/2016 1459   CREATININE 0.76 07/23/2023 0905   CREATININE 1.1 09/22/2016 1459      Component Value Date/Time   CALCIUM  8.9 07/23/2023 0905   CALCIUM  9.2 09/22/2016 1459   ALKPHOS 76 07/23/2023 0905   ALKPHOS 98 09/22/2016 1459   AST 30 07/23/2023 0905   AST 15 09/22/2016 1459   ALT 21 07/23/2023 0905   ALT 21 09/22/2016 1459   BILITOT 0.6 07/23/2023 0905   BILITOT 0.65 09/22/2016 1459       Impression and Plan: Ms. Storrs is a very nice 83 yo postmenopausal caucasian female with a solitary metastatic focus from breast cancer.  Her initial breast cancer was probably 7 years ago and  was estrogen positive. 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  The HER2 was equivocal on initial staining.  We will continue the Enhertu  for right now.  I do not think she is due for a PET scan probably until July or so.  We will plan to get her back in another 3 weeks.   Ivor Mars, MD 5/23/202510:32 AM

## 2023-07-23 NOTE — Radiation Completion Notes (Signed)
 Patient Name: Patricia [name], Patricia [name] MRN: [id number] Date of Birth: 03/01/1941 Referring Physician: Gray Layman, M.D. Date of Service: 2023-07-23 Radiation Oncologist: Colie Dawes, M.D. [facility] -                              RADIATION ONCOLOGY END OF TREATMENT NOTE     Diagnosis: C79.51 Secondary malignant neoplasm of bone Staging on 2021-07-04: Malignant neoplasm of upper-outer quadrant of left breast in female, estrogen receptor positive (HCC) T=T1b, N=N0, M=M1 Staging on 2013-06-14: Malignant neoplasm of upper-outer quadrant of left breast in female, estrogen receptor positive (HCC) T=T2, N=N0, M=cM0 Intent: Palliative     ==========DELIVERED PLANS==========  First Treatment Date: 2023-07-08 Last Treatment Date: 2023-07-21   Plan Name: Spine_T Site: Thoracic Spine Technique: 3D Mode: Photon Dose Per Fraction: 3 Gy Prescribed Dose (Delivered / Prescribed): 30 Gy / 30 Gy Prescribed Fxs (Delivered / Prescribed): 10 / 10     ==========ON TREATMENT VISIT DATES========== 2023-07-12, 2023-07-19     ==========UPCOMING VISITS==========       ==========APPENDIX - ON TREATMENT VISIT NOTES==========   See weekly On Treatment Notes in Epic for details in the Media tab (listed as Progress notes on the On Treatment Visit Dates listed above).

## 2023-07-24 LAB — CANCER ANTIGEN 27.29: CA 27.29: 50.5 U/mL — ABNORMAL HIGH (ref 0.0–38.6)

## 2023-07-25 ENCOUNTER — Other Ambulatory Visit: Payer: Self-pay | Admitting: Cardiology

## 2023-07-25 DIAGNOSIS — I4819 Other persistent atrial fibrillation: Secondary | ICD-10-CM

## 2023-07-27 NOTE — Telephone Encounter (Signed)
 Pt last saw Dr Micael Adas 05/05/23, last labs 07/23/23 Creat 0.76, age 83, weight 56.3kg, based on specified criteria pt is not on appropriate dosage of Eliquis .  Given weight less than 60kg, and age greater than 80, recommended dosage of Eliquis  is 2.5mg  BID for afib.  Please advise if dosage should be changed.  Thanks

## 2023-07-28 ENCOUNTER — Ambulatory Visit: Payer: Self-pay

## 2023-07-28 NOTE — Telephone Encounter (Signed)
 Called and spoke with pt's spouse, Ethelle Herb. Made him aware of Mrs Deasis's Eliquis  dose change. He verbalized understanding. Refill sent.

## 2023-07-28 NOTE — Telephone Encounter (Signed)
 Chief Complaint: upper abd pain Symptoms: pain in upper abd Frequency: x 1 week Pertinent Negatives: Patient denies chest pain nausea Disposition: [] ED /[x] Urgent Care (no appt availability in office) / [x] Appointment(In office/virtual)/ []  [facility]/ [] Home Care/ [] Refused Recommended Disposition /[] [facility]/ []  Follow-up with PCP  Additional Notes: husband states that that patient recently finished radiation. States on Sunday she started experiencing some pain in there upper abd and esophagus area while and after eating and drinking. States pt said pain around a 5/10 and comes and goes. Instructed UC if unable to get in with oncologist and also scheduled TOC appt.   Copied from CRM [id number]. Topic: Clinical - Red Word Triage >> Jul 28, 2023  1:22 PM Felizardo Hotter wrote: Red Word that prompted transfer to Nurse Triage: Pt's husband called [facility] ph: [phone], pt is having mid esophagus pain. Reason for Disposition  [1] MODERATE pain (e.g., interferes with normal activities) AND [2] comes and goes (cramps) AND [3] present > 24 hours  (Exception: Pain with Vomiting or Diarrhea - see that Guideline.)  Protocols used: Abdominal Pain - Upper-A-AH

## 2023-07-29 ENCOUNTER — Telehealth: Payer: Self-pay

## 2023-07-29 ENCOUNTER — Other Ambulatory Visit: Payer: Self-pay | Admitting: Radiology

## 2023-07-29 DIAGNOSIS — C7951 Secondary malignant neoplasm of bone: Secondary | ICD-10-CM

## 2023-07-29 MED ORDER — SUCRALFATE 1 G PO TABS: 1.0000 g | ORAL_TABLET | Freq: Three times a day (TID) | ORAL | 1 refills | Status: DC

## 2023-07-29 NOTE — Telephone Encounter (Signed)
 Received a call from patient's spouse to report patient have issues with swallowing since completing radiation treatment to her thoracic spine. Patient's completed treatment on 07/21/23. Patient has taken tramadol  to assist with the pain. Please advise

## 2023-07-30 ENCOUNTER — Encounter: Payer: Self-pay | Admitting: Hematology & Oncology

## 2023-08-03 ENCOUNTER — Ambulatory Visit (INDEPENDENT_AMBULATORY_CARE_PROVIDER_SITE_OTHER)

## 2023-08-03 VITALS — BP 113/74 | HR 75 | Ht 60.0 in | Wt 121.1 lb

## 2023-08-03 DIAGNOSIS — E559 Vitamin D deficiency, unspecified: Secondary | ICD-10-CM | POA: Diagnosis not present

## 2023-08-03 DIAGNOSIS — Z13228 Encounter for screening for other metabolic disorders: Secondary | ICD-10-CM | POA: Diagnosis not present

## 2023-08-03 DIAGNOSIS — E032 Hypothyroidism due to medicaments and other exogenous substances: Secondary | ICD-10-CM | POA: Diagnosis not present

## 2023-08-03 DIAGNOSIS — I10 Essential (primary) hypertension: Secondary | ICD-10-CM | POA: Diagnosis not present

## 2023-08-03 DIAGNOSIS — R7303 Prediabetes: Secondary | ICD-10-CM | POA: Diagnosis not present

## 2023-08-03 DIAGNOSIS — E785 Hyperlipidemia, unspecified: Secondary | ICD-10-CM | POA: Diagnosis not present

## 2023-08-03 DIAGNOSIS — E039 Hypothyroidism, unspecified: Secondary | ICD-10-CM

## 2023-08-03 DIAGNOSIS — R5383 Other fatigue: Secondary | ICD-10-CM

## 2023-08-03 NOTE — Assessment & Plan Note (Signed)
 Patient has a history of vitamin D  deficiency that was overcorrected 6 months ago and resulted in vitamin D  excess.  Patient reports that she currently takes 1000 international units of vitamin D3 per day.  Am rechecking vitamin D  level today and will follow-up with results.

## 2023-08-03 NOTE — Assessment & Plan Note (Signed)
 Last A1c was 1 year ago and was 6.3.  Patient not currently on glucose lowering medications.  I am updating A1c today and will adjust/add medication as needed.

## 2023-08-03 NOTE — Assessment & Plan Note (Signed)
 Last lipid panel was from 1 year ago, but was normal limits.  Am updating lipid panel today.  Will follow-up with results.  Continue atorvastatin  40 mg daily

## 2023-08-03 NOTE — Assessment & Plan Note (Signed)
 TSH has not been checked in 6 months.  Patient does endorse recent fatigue.  Will recheck TSH today and follow-up with results.  In the meantime continue levothyroxine  75 mcg daily

## 2023-08-03 NOTE — Patient Instructions (Addendum)
 It was nice to see you today!  As we discussed in clinic:   - Continue all of your current medications for hypothyroidism, hypertension, hyperlipidemia, and atrial fibrillation.  -I have placed orders to update your labs.  You can schedule this appointment as a lab visit before you checkout today. - You will receive a MyChart message for me in 24 to 48 hours explaining these results. -I will plan on seeing you in 6 months for follow-up on medication management.  If you have any problems before your next visit feel free to message me via MyChart (minor issues or questions) or call the office, otherwise you may reach out to schedule an office visit.  Thank you! Meryl Acosta, PA-C

## 2023-08-03 NOTE — Progress Notes (Signed)
 Established Patient Office Visit  Subjective   Patient ID: Patricia [name], female    DOB: Jun 30, 1940  Age: 83 y.o. MRN: [id number]  Chief Complaint  Patient presents with   Annual Exam    Establishing Care    HPI Patricia [name] is an 83 year old female who presents today for 38-month follow-up on hypertension, hyperlipidemia, prediabetes, hypothyroidism.  She is accompanied today by her husband.  She reports that she recently finished radiation treatment and is now undergoing chemo infusions for treatment of her cancer.  Hypertension: Patient currently taking furosemide  and metoprolol .  She and her husband report excellent compliance with medication.  She denies side effects.  She follows up with cardiology on a regular basis for history of atrial fibrillation.  Hypothyroidism: Patient currently taking 75 mcg of levothyroxine  regularly in the morning away from food and other vitamins/medications.  Patient and patient's husband do endorse some recent fatigue which is difficult to tell if this is because of her hypothyroidism or from the recent chemo treatments that she has been undergoing for cancer.  Has not had her TSH checked since her last visit in November.  Hyperlipidemia: Patient currently takes atorvastatin  40 mg daily.  Reports excellent compliance with medication denies recent myalgias or other side effects.  Pre-DM: A1c was 6.3 one year ago.  Has not been checked since.  Patient denies new wounds or sores that are not healing, polydipsia, polyuria.     ROS Per HPI.    Objective:     BP 113/74   Pulse 75   Ht 5' (1.524 m)   Wt 121 lb 1.9 oz (54.9 kg)   SpO2 98%   BMI 23.65 kg/m    Physical Exam Constitutional:      General: She is not in acute distress.    Appearance: Normal appearance.  Cardiovascular:     Rate and Rhythm: Normal rate. Rhythm irregular.     Heart sounds: Normal heart sounds. No murmur heard.    No friction rub. No gallop.  Pulmonary:     Effort:  Pulmonary effort is normal. No respiratory distress.     Breath sounds: Normal breath sounds.  Musculoskeletal:        General: No swelling.  Skin:    General: Skin is warm and dry.  Neurological:     General: No focal deficit present.     Mental Status: She is alert.  Psychiatric:        Mood and Affect: Mood normal.        Behavior: Behavior normal.        Thought Content: Thought content normal.      No results found for any visits on 08/03/23.    The ASCVD Risk score (Arnett DK, et al., 2019) failed to calculate for the following reasons:   The 2019 ASCVD risk score is only valid for ages 23 to 59   Risk score cannot be calculated because patient has a medical history suggesting prior/existing ASCVD    Assessment & Plan:   Essential hypertension Assessment & Plan: Blood pressure goal <140/90.  Stable and at goal.  Also followed by cardiology for history of permanent atrial fibrillation.  Continue metoprolol  tartrate 50 mg twice daily, furosemide  20 mg twice daily, digoxin  0.125 mg daily.  Will continue to monitor and coordinate care with cardiology team.  Orders: -     Comprehensive metabolic panel with GFR; Future  Vitamin D  deficiency Assessment & Plan: Patient has a history of vitamin D   deficiency that was overcorrected 6 months ago and resulted in vitamin D  excess.  Patient reports that she currently takes 1000 international units of vitamin D3 per day.  Am rechecking vitamin D  level today and will follow-up with results.  Orders: -     VITAMIN D  25 Hydroxy (Vit-D Deficiency, Fractures); Future  Hyperlipidemia, unspecified hyperlipidemia type Assessment & Plan: Last lipid panel was from 1 year ago, but was normal limits.  Am updating lipid panel today.  Will follow-up with results.  Continue atorvastatin  40 mg daily  Orders: -     Lipid panel; Future  Prediabetes Assessment & Plan: Last A1c was 1 year ago and was 6.3.  Patient not currently on glucose  lowering medications.  I am updating A1c today and will adjust/add medication as needed.  Orders: -     Hemoglobin A1c; Future -     Comprehensive metabolic panel with GFR; Future  Screening for metabolic disorder -     Comprehensive metabolic panel with GFR; Future  Fatigue, unspecified type -     B12 and Folate Panel; Future  Acquired hypothyroidism -     TSH; Future  Hypothyroidism due to medication Assessment & Plan: TSH has not been checked in 6 months.  Patient does endorse recent fatigue.  Will recheck TSH today and follow-up with results.  In the meantime continue levothyroxine  75 mcg daily    Return in about 6 months (around 02/02/2024) for HTN, DM, HLD, thyroid .    Patricia Bennett, PA-C

## 2023-08-03 NOTE — Assessment & Plan Note (Signed)
 Blood pressure goal <140/90.  Stable and at goal.  Also followed by cardiology for history of permanent atrial fibrillation.  Continue metoprolol  tartrate 50 mg twice daily, furosemide  20 mg twice daily, digoxin  0.125 mg daily.  Will continue to monitor and coordinate care with cardiology team.

## 2023-08-05 ENCOUNTER — Other Ambulatory Visit: Payer: Self-pay

## 2023-08-05 ENCOUNTER — Other Ambulatory Visit

## 2023-08-05 DIAGNOSIS — R5383 Other fatigue: Secondary | ICD-10-CM | POA: Diagnosis not present

## 2023-08-05 DIAGNOSIS — Z1231 Encounter for screening mammogram for malignant neoplasm of breast: Secondary | ICD-10-CM

## 2023-08-05 DIAGNOSIS — E559 Vitamin D deficiency, unspecified: Secondary | ICD-10-CM | POA: Diagnosis not present

## 2023-08-05 DIAGNOSIS — I10 Essential (primary) hypertension: Secondary | ICD-10-CM | POA: Diagnosis not present

## 2023-08-05 DIAGNOSIS — Z13228 Encounter for screening for other metabolic disorders: Secondary | ICD-10-CM

## 2023-08-05 DIAGNOSIS — R7303 Prediabetes: Secondary | ICD-10-CM | POA: Diagnosis not present

## 2023-08-05 DIAGNOSIS — E785 Hyperlipidemia, unspecified: Secondary | ICD-10-CM | POA: Diagnosis not present

## 2023-08-05 DIAGNOSIS — E039 Hypothyroidism, unspecified: Secondary | ICD-10-CM

## 2023-08-05 NOTE — Progress Notes (Signed)
 LABS

## 2023-08-06 ENCOUNTER — Encounter: Payer: Self-pay | Admitting: Hematology & Oncology

## 2023-08-06 LAB — COMPREHENSIVE METABOLIC PANEL WITH GFR
ALT: 32 IU/L (ref 0–32)
AST: 57 IU/L — ABNORMAL HIGH (ref 0–40)
Albumin: 3.3 g/dL — ABNORMAL LOW (ref 3.7–4.7)
Alkaline Phosphatase: 88 IU/L (ref 44–121)
BUN/Creatinine Ratio: 21 (ref 12–28)
BUN: 16 mg/dL (ref 8–27)
Bilirubin Total: 0.7 mg/dL (ref 0.0–1.2)
CO2: 23 mmol/L (ref 20–29)
Calcium: 8.5 mg/dL — ABNORMAL LOW (ref 8.7–10.3)
Chloride: 104 mmol/L (ref 96–106)
Creatinine, Ser: 0.77 mg/dL (ref 0.57–1.00)
Globulin, Total: 2.2 g/dL (ref 1.5–4.5)
Glucose: 98 mg/dL (ref 70–99)
Potassium: 3.5 mmol/L (ref 3.5–5.2)
Sodium: 141 mmol/L (ref 134–144)
Total Protein: 5.5 g/dL — ABNORMAL LOW (ref 6.0–8.5)
eGFR: 77 mL/min/{1.73_m2} (ref >59)

## 2023-08-06 LAB — VITAMIN D 25 HYDROXY (VIT D DEFICIENCY, FRACTURES): Vit D, 25-Hydroxy: 137 ng/mL — ABNORMAL HIGH (ref 30.0–100.0)

## 2023-08-06 LAB — LIPID PANEL
Chol/HDL Ratio: 3.9 ratio (ref 0.0–4.4)
Cholesterol, Total: 132 mg/dL (ref 100–199)
HDL: 34 mg/dL — ABNORMAL LOW (ref >39)
LDL Chol Calc (NIH): 72 mg/dL (ref 0–99)
Triglycerides: 146 mg/dL (ref 0–149)
VLDL Cholesterol Cal: 26 mg/dL (ref 5–40)

## 2023-08-06 LAB — HEMOGLOBIN A1C
Est. average glucose Bld gHb Est-mCnc: 126 mg/dL
Hgb A1c MFr Bld: 6 % — ABNORMAL HIGH (ref 4.8–5.6)

## 2023-08-06 LAB — TSH: TSH: 10.5 u[IU]/mL — ABNORMAL HIGH (ref 0.450–4.500)

## 2023-08-06 LAB — B12 AND FOLATE PANEL
Folate: >20 ng/mL (ref >3.0)
Vitamin B-12: 1109 pg/mL (ref 232–1245)

## 2023-08-07 ENCOUNTER — Other Ambulatory Visit: Payer: Self-pay

## 2023-08-07 ENCOUNTER — Ambulatory Visit: Payer: Self-pay

## 2023-08-07 MED ORDER — THYROID 90 MG PO TABS: 90.0000 mg | ORAL_TABLET | Freq: Every day | ORAL | 2 refills | Status: DC

## 2023-08-11 ENCOUNTER — Other Ambulatory Visit: Payer: Self-pay | Admitting: Cardiology

## 2023-08-13 ENCOUNTER — Inpatient Hospital Stay: Attending: Hematology & Oncology

## 2023-08-13 ENCOUNTER — Encounter: Payer: Self-pay | Admitting: Hematology & Oncology

## 2023-08-13 ENCOUNTER — Inpatient Hospital Stay

## 2023-08-13 ENCOUNTER — Inpatient Hospital Stay (HOSPITAL_BASED_OUTPATIENT_CLINIC_OR_DEPARTMENT_OTHER): Admitting: Hematology & Oncology

## 2023-08-13 VITALS — BP 113/81 | HR 76 | Temp 98.2°F | Resp 15 | Ht 60.0 in | Wt 119.9 lb

## 2023-08-13 DIAGNOSIS — I351 Nonrheumatic aortic (valve) insufficiency: Secondary | ICD-10-CM

## 2023-08-13 DIAGNOSIS — H401111 Primary open-angle glaucoma, right eye, mild stage: Secondary | ICD-10-CM

## 2023-08-13 DIAGNOSIS — C50412 Malignant neoplasm of upper-outer quadrant of left female breast: Secondary | ICD-10-CM

## 2023-08-13 DIAGNOSIS — R7303 Prediabetes: Secondary | ICD-10-CM

## 2023-08-13 DIAGNOSIS — Z17421 Hormone receptor negative with human epidermal growth factor receptor 2 negative status: Secondary | ICD-10-CM | POA: Diagnosis not present

## 2023-08-13 DIAGNOSIS — C7951 Secondary malignant neoplasm of bone: Secondary | ICD-10-CM

## 2023-08-13 DIAGNOSIS — C50912 Malignant neoplasm of unspecified site of left female breast: Secondary | ICD-10-CM | POA: Insufficient documentation

## 2023-08-13 DIAGNOSIS — R413 Other amnesia: Secondary | ICD-10-CM

## 2023-08-13 DIAGNOSIS — K219 Gastro-esophageal reflux disease without esophagitis: Secondary | ICD-10-CM

## 2023-08-13 DIAGNOSIS — R8271 Bacteriuria: Secondary | ICD-10-CM

## 2023-08-13 DIAGNOSIS — R942 Abnormal results of pulmonary function studies: Secondary | ICD-10-CM

## 2023-08-13 DIAGNOSIS — D72829 Elevated white blood cell count, unspecified: Secondary | ICD-10-CM

## 2023-08-13 DIAGNOSIS — G40909 Epilepsy, unspecified, not intractable, without status epilepticus: Secondary | ICD-10-CM

## 2023-08-13 DIAGNOSIS — Z170 Estrogen receptor positive status [ER+]: Secondary | ICD-10-CM | POA: Diagnosis not present

## 2023-08-13 DIAGNOSIS — M8580 Other specified disorders of bone density and structure, unspecified site: Secondary | ICD-10-CM

## 2023-08-13 DIAGNOSIS — I272 Pulmonary hypertension, unspecified: Secondary | ICD-10-CM

## 2023-08-13 DIAGNOSIS — E042 Nontoxic multinodular goiter: Secondary | ICD-10-CM

## 2023-08-13 DIAGNOSIS — I517 Cardiomegaly: Secondary | ICD-10-CM

## 2023-08-13 DIAGNOSIS — J45991 Cough variant asthma: Secondary | ICD-10-CM

## 2023-08-13 DIAGNOSIS — Z5112 Encounter for antineoplastic immunotherapy: Secondary | ICD-10-CM | POA: Insufficient documentation

## 2023-08-13 DIAGNOSIS — M1990 Unspecified osteoarthritis, unspecified site: Secondary | ICD-10-CM

## 2023-08-13 DIAGNOSIS — I340 Nonrheumatic mitral (valve) insufficiency: Secondary | ICD-10-CM

## 2023-08-13 DIAGNOSIS — R4182 Altered mental status, unspecified: Secondary | ICD-10-CM

## 2023-08-13 DIAGNOSIS — M898X5 Other specified disorders of bone, thigh: Secondary | ICD-10-CM

## 2023-08-13 DIAGNOSIS — I639 Cerebral infarction, unspecified: Secondary | ICD-10-CM

## 2023-08-13 DIAGNOSIS — I10 Essential (primary) hypertension: Secondary | ICD-10-CM

## 2023-08-13 DIAGNOSIS — K5730 Diverticulosis of large intestine without perforation or abscess without bleeding: Secondary | ICD-10-CM

## 2023-08-13 DIAGNOSIS — E559 Vitamin D deficiency, unspecified: Secondary | ICD-10-CM

## 2023-08-13 DIAGNOSIS — J385 Laryngeal spasm: Secondary | ICD-10-CM

## 2023-08-13 DIAGNOSIS — Z8673 Personal history of transient ischemic attack (TIA), and cerebral infarction without residual deficits: Secondary | ICD-10-CM

## 2023-08-13 DIAGNOSIS — C3410 Malignant neoplasm of upper lobe, unspecified bronchus or lung: Secondary | ICD-10-CM

## 2023-08-13 DIAGNOSIS — I5032 Chronic diastolic (congestive) heart failure: Secondary | ICD-10-CM

## 2023-08-13 DIAGNOSIS — Z0000 Encounter for general adult medical examination without abnormal findings: Secondary | ICD-10-CM

## 2023-08-13 DIAGNOSIS — I5033 Acute on chronic diastolic (congestive) heart failure: Secondary | ICD-10-CM

## 2023-08-13 DIAGNOSIS — K222 Esophageal obstruction: Secondary | ICD-10-CM

## 2023-08-13 DIAGNOSIS — Z85118 Personal history of other malignant neoplasm of bronchus and lung: Secondary | ICD-10-CM

## 2023-08-13 DIAGNOSIS — N1831 Chronic kidney disease, stage 3a: Secondary | ICD-10-CM

## 2023-08-13 DIAGNOSIS — R053 Chronic cough: Secondary | ICD-10-CM

## 2023-08-13 DIAGNOSIS — J810 Acute pulmonary edema: Secondary | ICD-10-CM

## 2023-08-13 DIAGNOSIS — Z79899 Other long term (current) drug therapy: Secondary | ICD-10-CM

## 2023-08-13 DIAGNOSIS — Z124 Encounter for screening for malignant neoplasm of cervix: Secondary | ICD-10-CM

## 2023-08-13 DIAGNOSIS — J90 Pleural effusion, not elsewhere classified: Secondary | ICD-10-CM

## 2023-08-13 DIAGNOSIS — H401122 Primary open-angle glaucoma, left eye, moderate stage: Secondary | ICD-10-CM

## 2023-08-13 DIAGNOSIS — M810 Age-related osteoporosis without current pathological fracture: Secondary | ICD-10-CM

## 2023-08-13 DIAGNOSIS — E032 Hypothyroidism due to medicaments and other exogenous substances: Secondary | ICD-10-CM

## 2023-08-13 DIAGNOSIS — I4891 Unspecified atrial fibrillation: Secondary | ICD-10-CM

## 2023-08-13 DIAGNOSIS — I4819 Other persistent atrial fibrillation: Secondary | ICD-10-CM

## 2023-08-13 DIAGNOSIS — N951 Menopausal and female climacteric states: Secondary | ICD-10-CM

## 2023-08-13 LAB — CBC WITH DIFFERENTIAL (CANCER CENTER ONLY)
Abs Immature Granulocytes: 0.02 10*3/uL (ref 0.00–0.07)
Basophils Absolute: 0 10*3/uL (ref 0.0–0.1)
Basophils Relative: 1 %
Eosinophils Absolute: 0 10*3/uL (ref 0.0–0.5)
Eosinophils Relative: 0 %
HCT: 34.7 % — ABNORMAL LOW (ref 36.0–46.0)
Hemoglobin: 12 g/dL (ref 12.0–15.0)
Immature Granulocytes: 1 %
Lymphocytes Relative: 10 %
Lymphs Abs: 0.3 10*3/uL — ABNORMAL LOW (ref 0.7–4.0)
MCH: 35.5 pg — ABNORMAL HIGH (ref 26.0–34.0)
MCHC: 34.6 g/dL (ref 30.0–36.0)
MCV: 102.7 fL — ABNORMAL HIGH (ref 80.0–100.0)
Monocytes Absolute: 0.5 10*3/uL (ref 0.1–1.0)
Monocytes Relative: 18 %
Neutro Abs: 2.2 10*3/uL (ref 1.7–7.7)
Neutrophils Relative %: 70 %
Platelet Count: 124 10*3/uL — ABNORMAL LOW (ref 150–400)
RBC: 3.38 MIL/uL — ABNORMAL LOW (ref 3.87–5.11)
RDW: 17.3 % — ABNORMAL HIGH (ref 11.5–15.5)
WBC Count: 3 10*3/uL — ABNORMAL LOW (ref 4.0–10.5)
nRBC: 0 % (ref 0.0–0.2)

## 2023-08-13 LAB — CMP (CANCER CENTER ONLY)
ALT: 32 U/L (ref 0–44)
AST: 49 U/L — ABNORMAL HIGH (ref 15–41)
Albumin: 3.5 g/dL (ref 3.5–5.0)
Alkaline Phosphatase: 68 U/L (ref 38–126)
Anion gap: 6 (ref 5–15)
BUN: 18 mg/dL (ref 8–23)
CO2: 28 mmol/L (ref 22–32)
Calcium: 8.9 mg/dL (ref 8.9–10.3)
Chloride: 105 mmol/L (ref 98–111)
Creatinine: 0.89 mg/dL (ref 0.44–1.00)
GFR, Estimated: >60 mL/min (ref >60)
Glucose, Bld: 182 mg/dL — ABNORMAL HIGH (ref 70–99)
Potassium: 3.9 mmol/L (ref 3.5–5.1)
Sodium: 139 mmol/L (ref 135–145)
Total Bilirubin: 0.9 mg/dL (ref 0.0–1.2)
Total Protein: 5.8 g/dL — ABNORMAL LOW (ref 6.5–8.1)

## 2023-08-13 LAB — LACTATE DEHYDROGENASE: LDH: 451 U/L — ABNORMAL HIGH (ref 98–192)

## 2023-08-13 MED ORDER — MEGESTROL ACETATE 400 MG/10ML PO SUSP: 400.0000 mg | Freq: Two times a day (BID) | ORAL | 3 refills | Status: DC

## 2023-08-13 MED ORDER — DENOSUMAB 120 MG/1.7ML ~~LOC~~ SOLN
120.0000 mg | Freq: Once | SUBCUTANEOUS | Status: AC
  Administered 2023-08-13: 120 mg via SUBCUTANEOUS
  Filled 2023-08-13: qty 1.7

## 2023-08-13 MED ORDER — DIPHENHYDRAMINE HCL 25 MG PO CAPS
25.0000 mg | ORAL_CAPSULE | Freq: Once | ORAL | Status: AC
  Administered 2023-08-13: 25 mg via ORAL
  Filled 2023-08-13: qty 1

## 2023-08-13 MED ORDER — SODIUM CHLORIDE 0.9% FLUSH
10.0000 mL | INTRAVENOUS | Status: DC | PRN
  Administered 2023-08-13: 10 mL

## 2023-08-13 MED ORDER — DEXAMETHASONE SODIUM PHOSPHATE 10 MG/ML IJ SOLN
10.0000 mg | Freq: Once | INTRAMUSCULAR | Status: AC
  Administered 2023-08-13: 10 mg via INTRAVENOUS
  Filled 2023-08-13: qty 1

## 2023-08-13 MED ORDER — DRONABINOL 2.5 MG PO CAPS: 2.5000 mg | ORAL_CAPSULE | Freq: Two times a day (BID) | ORAL | 0 refills | Status: DC

## 2023-08-13 MED ORDER — HEPARIN SOD (PORK) LOCK FLUSH 100 UNIT/ML IV SOLN
500.0000 [IU] | Freq: Once | INTRAVENOUS | Status: AC | PRN
  Administered 2023-08-13: 500 [IU]

## 2023-08-13 MED ORDER — ACETAMINOPHEN 325 MG PO TABS
650.0000 mg | ORAL_TABLET | Freq: Once | ORAL | Status: AC
  Administered 2023-08-13: 650 mg via ORAL
  Filled 2023-08-13: qty 2

## 2023-08-13 MED ORDER — SODIUM CHLORIDE 0.9 % IV SOLN
150.0000 mg | Freq: Once | INTRAVENOUS | Status: AC
  Administered 2023-08-13: 150 mg via INTRAVENOUS
  Filled 2023-08-13: qty 150

## 2023-08-13 MED ORDER — DEXTROSE 5 % IV SOLN: Freq: Once | INTRAVENOUS | Status: AC

## 2023-08-13 MED ORDER — FAM-TRASTUZUMAB DERUXTECAN-NXKI CHEMO 100 MG IV SOLR
300.0000 mg | Freq: Once | INTRAVENOUS | Status: AC
  Administered 2023-08-13: 300 mg via INTRAVENOUS
  Filled 2023-08-13: qty 15

## 2023-08-13 MED ORDER — PALONOSETRON HCL INJECTION 0.25 MG/5ML
0.2500 mg | Freq: Once | INTRAVENOUS | Status: AC
  Administered 2023-08-13: 0.25 mg via INTRAVENOUS
  Filled 2023-08-13: qty 5

## 2023-08-13 NOTE — Patient Instructions (Signed)

## 2023-08-13 NOTE — Progress Notes (Signed)
 Hematology and Oncology Follow Up Visit  Patricia [name] [id number] 18-Aug-1940 83 y.o. 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 Q4-6 months    Interim History:  Patricia [name] is here today with her husband for follow-up and treatment.  Unfortunately, I think that we may have some issues.  Looks like she is losing weight.  This really bothers me.  I think going to have to get on something to try to get her to eat a little bit better.  We are to get her on some Marinol and some Megace.  She is having little bit of pain in the right hip area.  She has had no cough.  She has had no change in bowel or bladder habits.  She has had no cardiac issues.  I think the problem is that her CA 27.29 has been creeping up.  This is being creeping up slowly but surely.  It was 50 when we last saw her.  We definitely need to get another scan on her so we see how everything looks.  She has had no fever.  She has had no bleeding.  Overall, I would have to say that her performance status is probably ECOG 2.   Medications:  Allergies as of 08/13/2023       Reactions   Combigan [brimonidine Tartrate-timolol ] Itching   Per patient made eyes red, sore, and sensitivity to light   Sulfa Antibiotics Hives, Rash   Sulfamethoxazole-trimethoprim Hives        Medication List        Accurate as of August 13, 2023  9:56 AM. If you have any questions, ask your nurse or doctor.          albuterol  108 (90 Base)  MCG/ACT inhaler Commonly known as: VENTOLIN  HFA Inhale 2 puffs into the lungs every 6 (six) hours as needed for wheezing or shortness of breath.   apixaban  2.5 MG Tabs tablet Commonly known as: ELIQUIS  Take 1 tablet (2.5 mg total) by mouth 2 (two) times daily.   atorvastatin  40 MG tablet Commonly known as: LIPITOR TAKE 1 TABLET DAILY BEFORE SUPPER   bimatoprost 0.01 % Soln Commonly known as: LUMIGAN Place 1 drop into both eyes 2 (two) times daily.   calcium -vitamin D  500-200 MG-UNIT tablet Commonly known as: OSCAL WITH D Take 1 tablet by mouth daily with breakfast.   Cerefolin 07-31-48-5 MG Tabs Take 1 tablet by mouth every morning.   dexamethasone  4 MG tablet Commonly known as: DECADRON  Take 2 tablets (8 mg) by mouth daily for 3 days starting the day after chemotherapy. Take with food.   digoxin  0.125 MG tablet Commonly known as: LANOXIN  Take 1 tablet (0.125 mg total) by mouth daily.   donepezil  5 MG tablet Commonly known as: ARICEPT  Take 1 tablet (5 mg total) by mouth at bedtime.   dorzolamide -timolol  2-0.5 % ophthalmic solution Commonly known as: COSOPT  Place 1 drop into both eyes at bedtime.   fluticasone  50 MCG/ACT nasal spray Commonly known as: FLONASE  1 spray  each nostril following sinus rinses twice daily   fluticasone -salmeterol 230-21 MCG/ACT inhaler Commonly known as: Advair  HFA USE 2 INHALATIONS TWICE A DAY   folic acid  400 MCG tablet Commonly known as: FOLVITE  Take 400 mcg by mouth daily.   furosemide  20 MG tablet Commonly known as: LASIX  TAKE 1 TABLET TWICE A DAY. TAKE ADDITIONAL 1 TABLET AS NEEDED FOR SWELLING   gabapentin  400 MG capsule Commonly known as: NEURONTIN  TAKE 1 CAPSULE TWICE A DAY   levETIRAcetam  500 MG 24 hr tablet Commonly known as: KEPPRA  XR TAKE 1 TABLET AT BEDTIME   lidocaine -prilocaine  cream Commonly known as: EMLA  Apply a dime size of cream to port-a-cath 1-2 hours prior to access. Cover with [employer].   loratadine   10 MG tablet Commonly known as: CLARITIN  Take 10 mg by mouth daily.   memantine  10 MG tablet Commonly known as: NAMENDA  TAKE 1 TABLET TWICE A DAY   metoprolol  tartrate 50 MG tablet Commonly known as: LOPRESSOR  TAKE 1 TABLET TWICE A DAY   Multivitamin/Iron Tabs Take 1 tablet by mouth daily.   ondansetron  8 MG tablet Commonly known as: Zofran  Take 1 tablet (8 mg total) by mouth every 8 (eight) hours as needed for nausea or vomiting. Start on the third day after chemotherapy.   OVER THE COUNTER MEDICATION Take 1 tablet by mouth daily. Zyflamend otc herbal supplement   pantoprazole  40 MG tablet Commonly known as: PROTONIX  TAKE 1 TABLET DAILY   potassium chloride  SA 20 MEQ tablet Commonly known as: KLOR-CON  M Take 1 tablet (20 mEq total) by mouth daily.   Prevagen 10 MG Caps Generic drug: Apoaequorin Take 1 capsule by mouth every morning.   prochlorperazine  10 MG tablet Commonly known as: COMPAZINE  Take 1 tablet (10 mg total) by mouth every 6 (six) hours as needed for nausea or vomiting.   Rhopressa 0.02 % Soln Generic drug: Netarsudil  Dimesylate Place 1 drop into both eyes at bedtime.   sucralfate  1 g tablet Commonly known as: Carafate  Take 1 tablet (1 g total) by mouth 4 (four) times daily -  with meals and at bedtime.   thyroid  90 MG tablet Commonly known as: Armour Thyroid  Take 1 tablet (90 mg total) by mouth daily.   traMADol  50 MG tablet Commonly known as: ULTRAM  Take 1 tablet (50 mg total) by mouth every 6 (six) hours as needed (post op pain).   Vitamin D3 50 MCG (2000 UT) capsule Take 2,000 Units by mouth daily.        Allergies:  Allergies  Allergen Reactions   Combigan [Brimonidine Tartrate-Timolol ] Itching    Per patient made eyes red, sore, and sensitivity to light   Sulfa Antibiotics Hives and Rash   Sulfamethoxazole-Trimethoprim Hives    Past Medical History, Surgical history, Social history, and Family History were reviewed and  updated.  Review of Systems: Review of Systems  Constitutional: Negative.   HENT: Negative.    Eyes: Negative.   Respiratory: Negative.    Cardiovascular: Negative.   Gastrointestinal: Negative.   Genitourinary: Negative.   Musculoskeletal: Negative.   Skin: Negative.   Neurological: Negative.   Endo/Heme/Allergies: Negative.   Psychiatric/Behavioral:  Positive for memory loss.      Physical Exam:  height is 5' (1.524 m) and weight is 119 lb 14.4 oz (54.4 kg). Her oral temperature is 98.2 F (36.8 C). Her blood pressure is 113/81 and her pulse is 76. Her respiration is 15 and oxygen saturation is 100%.   Wt Readings from Last  3 Encounters:  08/13/23 119 lb 14.4 oz (54.4 kg)  08/03/23 121 lb 1.9 oz (54.9 kg)  07/23/23 124 lb 1.9 oz (56.3 kg)    Physical Exam Vitals reviewed.  HENT:     Head: Normocephalic and atraumatic.   Eyes:     Pupils: Pupils are equal, round, and reactive to light.    Cardiovascular:     Rate and Rhythm: Normal rate and regular rhythm.     Heart sounds: Normal heart sounds.  Pulmonary:     Effort: Pulmonary effort is normal.     Breath sounds: Normal breath sounds.  Abdominal:     General: Bowel sounds are normal.     Palpations: Abdomen is soft.   Musculoskeletal:        General: No tenderness or deformity. Normal range of motion.     Cervical back: Normal range of motion.  Lymphadenopathy:     Cervical: No cervical adenopathy.   Skin:    General: Skin is warm and dry.     Findings: No erythema or rash.   Neurological:     Mental Status: She is alert and oriented to person, place, and time.   Psychiatric:        Behavior: Behavior normal.        Thought Content: Thought content normal.        Judgment: Judgment normal.      Lab Results  Component Value Date   WBC 3.0 (L) 08/13/2023   HGB 12.0 08/13/2023   HCT 34.7 (L) 08/13/2023   MCV 102.7 (H) 08/13/2023   PLT 124 (L) 08/13/2023   Lab Results  Component Value Date    FERRITIN 172 07/02/2023   IRON 80 07/02/2023   TIBC 301 07/02/2023   UIBC 221 07/02/2023   IRONPCTSAT 27 07/02/2023   Lab Results  Component Value Date   RETICCTPCT 5.2 (H) 05/21/2023   RBC 3.38 (L) 08/13/2023   Lab Results  Component Value Date   KPAFRELGTCHN 25.6 (H) 05/30/2021   LAMBDASER 15.6 05/30/2021   KAPLAMBRATIO 1.64 05/30/2021   Lab Results  Component Value Date   IGGSERUM 1,122 05/30/2021   IGA 184 05/30/2021   IGMSERUM 235 (H) 05/30/2021   Lab Results  Component Value Date   TOTALPROTELP 6.6 05/30/2021   ALBUMINELP 3.5 05/30/2021   A1GS 0.2 05/30/2021   A2GS 0.8 05/30/2021   BETS 0.9 05/30/2021   GAMS 1.2 05/30/2021   MSPIKE Not Observed 05/30/2021     Chemistry      Component Value Date/Time   NA 139 08/13/2023 0905   NA 141 08/05/2023 1018   NA 141 09/22/2016 1459   K 3.9 08/13/2023 0905   K 3.8 09/22/2016 1459   CL 105 08/13/2023 0905   CO2 28 08/13/2023 0905   CO2 26 09/22/2016 1459   BUN 18 08/13/2023 0905   BUN 16 08/05/2023 1018   BUN 19.8 09/22/2016 1459   CREATININE 0.89 08/13/2023 0905   CREATININE 1.1 09/22/2016 1459      Component Value Date/Time   CALCIUM  8.9 08/13/2023 0905   CALCIUM  9.2 09/22/2016 1459   ALKPHOS 68 08/13/2023 0905   ALKPHOS 98 09/22/2016 1459   AST 49 (H) 08/13/2023 0905   AST 15 09/22/2016 1459   ALT 32 08/13/2023 0905   ALT 21 09/22/2016 1459   BILITOT 0.9 08/13/2023 0905   BILITOT 0.65 09/22/2016 1459       Impression and Plan: Ms. Fellman is a very nice 83 yo  postmenopausal caucasian female with a solitary metastatic focus from breast cancer.  Her initial breast cancer was probably 7 years ago and was estrogen positive. 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  The HER2 was equivocal on initial staining.  We will continue the Enhertu  for right now.  Again, I realized that the CA 27.29 has been going up.  She really is not a great candidate for chemotherapy given her performance status.  We will  see what the PET scan shows.  Will try to get this set up for her in about 3 weeks or so.  For right now, I really think that we had to focus on her weight gain.  Hopefully, the Marinol and the Megace will help.  I would like to see her back in about 4 to 5 weeks.  I want to give her maybe a couple weeks off to try to get her to improve her performance status.     Ivor Mars, MD 6/13/20259:56 AM

## 2023-08-13 NOTE — Patient Instructions (Signed)
 [facility] - A DEPT OF [facility]  Discharge Instructions: Thank you for choosing [facility] to provide your oncology and hematology care.   If you have a lab appointment with the Cancer Center, please go directly to the Cancer Center and check in at the registration area.  Wear comfortable clothing and clothing appropriate for easy access to any Portacath or PICC line.   We strive to give you quality time with your provider. You may need to reschedule your appointment if you arrive late (15 or more minutes).  Arriving late affects you and other patients whose appointments are after yours.  Also, if you miss three or more appointments without notifying the office, you may be dismissed from the clinic at the provider's discretion.      For prescription refill requests, have your pharmacy contact our office and allow 72 hours for refills to be completed.    Today you received the following chemotherapy and/or immunotherapy agents Enhertu      To help prevent nausea and vomiting after your treatment, we encourage you to take your nausea medication as directed.  BELOW ARE SYMPTOMS THAT SHOULD BE REPORTED IMMEDIATELY: *FEVER GREATER THAN 100.4 F (38 C) OR HIGHER *CHILLS OR SWEATING *NAUSEA AND VOMITING THAT IS NOT CONTROLLED WITH YOUR NAUSEA MEDICATION *UNUSUAL SHORTNESS OF BREATH *UNUSUAL BRUISING OR BLEEDING *URINARY PROBLEMS (pain or burning when urinating, or frequent urination) *BOWEL PROBLEMS (unusual diarrhea, constipation, pain near the anus) TENDERNESS IN MOUTH AND THROAT WITH OR WITHOUT PRESENCE OF ULCERS (sore throat, sores in mouth, or a toothache) UNUSUAL RASH, SWELLING OR PAIN  UNUSUAL VAGINAL DISCHARGE OR ITCHING   Items with * indicate a potential emergency and should be followed up as soon as possible or go to the Emergency Department if any problems should occur.  Please show the CHEMOTHERAPY ALERT CARD or IMMUNOTHERAPY  ALERT CARD at check-in to the Emergency Department and triage nurse. Should you have questions after your visit or need to cancel or reschedule your appointment, please contact [facility] - A DEPT OF [facility]  [phone] and follow the prompts.  Office hours are 8:00 a.m. to 4:30 p.m. Monday - Friday. Please note that voicemails left after 4:00 p.m. may not be returned until the following business day.  We are closed weekends and major holidays. You have access to a nurse at all times for urgent questions. Please call the main number to the clinic [phone] and follow the prompts.  For any non-urgent questions, you may also contact your provider using MyChart. We now offer e-Visits for anyone 48 and older to request care online for non-urgent symptoms. For details visit [email or web address].   Also download the MyChart app! Go to the app store, search "MyChart", open the app, select , and log in with your MyChart username and password.

## 2023-08-14 LAB — CANCER ANTIGEN 27.29: CA 27.29: 62.8 U/mL — ABNORMAL HIGH (ref 0.0–38.6)

## 2023-08-16 ENCOUNTER — Encounter: Payer: Self-pay | Admitting: *Deleted

## 2023-08-16 ENCOUNTER — Ambulatory Visit: Payer: Self-pay | Admitting: Hematology & Oncology

## 2023-08-20 ENCOUNTER — Other Ambulatory Visit: Payer: Self-pay | Admitting: Physician Assistant

## 2023-08-20 ENCOUNTER — Other Ambulatory Visit: Payer: Self-pay

## 2023-08-20 ENCOUNTER — Inpatient Hospital Stay (HOSPITAL_COMMUNITY)
Admission: EM | Admit: 2023-08-20 | Discharge: 2023-09-09 | DRG: 809 | Disposition: A | Discharge status: Skilled Nursing Facility | Attending: Internal Medicine | Admitting: Internal Medicine

## 2023-08-20 ENCOUNTER — Emergency Department (HOSPITAL_COMMUNITY)

## 2023-08-20 ENCOUNTER — Encounter (HOSPITAL_COMMUNITY): Payer: Self-pay | Admitting: Emergency Medicine

## 2023-08-20 DIAGNOSIS — E876 Hypokalemia: Secondary | ICD-10-CM | POA: Diagnosis not present

## 2023-08-20 DIAGNOSIS — G35 Multiple sclerosis: Secondary | ICD-10-CM | POA: Diagnosis not present

## 2023-08-20 DIAGNOSIS — Z881 Allergy status to other antibiotic agents status: Secondary | ICD-10-CM

## 2023-08-20 DIAGNOSIS — D6481 Anemia due to antineoplastic chemotherapy: Secondary | ICD-10-CM | POA: Diagnosis present

## 2023-08-20 DIAGNOSIS — Z170 Estrogen receptor positive status [ER+]: Secondary | ICD-10-CM | POA: Diagnosis not present

## 2023-08-20 DIAGNOSIS — M25572 Pain in left ankle and joints of left foot: Secondary | ICD-10-CM | POA: Diagnosis not present

## 2023-08-20 DIAGNOSIS — I4891 Unspecified atrial fibrillation: Secondary | ICD-10-CM | POA: Diagnosis not present

## 2023-08-20 DIAGNOSIS — C3492 Malignant neoplasm of unspecified part of left bronchus or lung: Secondary | ICD-10-CM | POA: Diagnosis not present

## 2023-08-20 DIAGNOSIS — M25551 Pain in right hip: Secondary | ICD-10-CM | POA: Diagnosis not present

## 2023-08-20 DIAGNOSIS — R627 Adult failure to thrive: Secondary | ICD-10-CM | POA: Diagnosis not present

## 2023-08-20 DIAGNOSIS — R945 Abnormal results of liver function studies: Secondary | ICD-10-CM | POA: Diagnosis not present

## 2023-08-20 DIAGNOSIS — N3000 Acute cystitis without hematuria: Secondary | ICD-10-CM | POA: Diagnosis present

## 2023-08-20 DIAGNOSIS — I110 Hypertensive heart disease with heart failure: Secondary | ICD-10-CM | POA: Diagnosis present

## 2023-08-20 DIAGNOSIS — Z8673 Personal history of transient ischemic attack (TIA), and cerebral infarction without residual deficits: Secondary | ICD-10-CM

## 2023-08-20 DIAGNOSIS — C799 Secondary malignant neoplasm of unspecified site: Secondary | ICD-10-CM | POA: Diagnosis not present

## 2023-08-20 DIAGNOSIS — F0390 Unspecified dementia without behavioral disturbance: Secondary | ICD-10-CM

## 2023-08-20 DIAGNOSIS — Z711 Person with feared health complaint in whom no diagnosis is made: Secondary | ICD-10-CM | POA: Diagnosis not present

## 2023-08-20 DIAGNOSIS — I5032 Chronic diastolic (congestive) heart failure: Secondary | ICD-10-CM | POA: Diagnosis not present

## 2023-08-20 DIAGNOSIS — E039 Hypothyroidism, unspecified: Secondary | ICD-10-CM | POA: Diagnosis not present

## 2023-08-20 DIAGNOSIS — F0394 Unspecified dementia, unspecified severity, with anxiety: Secondary | ICD-10-CM | POA: Diagnosis not present

## 2023-08-20 DIAGNOSIS — R188 Other ascites: Secondary | ICD-10-CM | POA: Diagnosis not present

## 2023-08-20 DIAGNOSIS — Z66 Do not resuscitate: Secondary | ICD-10-CM | POA: Diagnosis present

## 2023-08-20 DIAGNOSIS — Z882 Allergy status to sulfonamides status: Secondary | ICD-10-CM

## 2023-08-20 DIAGNOSIS — G40909 Epilepsy, unspecified, not intractable, without status epilepticus: Secondary | ICD-10-CM | POA: Diagnosis present

## 2023-08-20 DIAGNOSIS — Z1732 Human epidermal growth factor receptor 2 negative status: Secondary | ICD-10-CM | POA: Diagnosis not present

## 2023-08-20 DIAGNOSIS — D61818 Other pancytopenia: Secondary | ICD-10-CM | POA: Diagnosis not present

## 2023-08-20 DIAGNOSIS — R531 Weakness: Secondary | ICD-10-CM | POA: Diagnosis not present

## 2023-08-20 DIAGNOSIS — Z7189 Other specified counseling: Secondary | ICD-10-CM

## 2023-08-20 DIAGNOSIS — Z87891 Personal history of nicotine dependence: Secondary | ICD-10-CM

## 2023-08-20 DIAGNOSIS — K219 Gastro-esophageal reflux disease without esophagitis: Secondary | ICD-10-CM | POA: Diagnosis present

## 2023-08-20 DIAGNOSIS — R4182 Altered mental status, unspecified: Secondary | ICD-10-CM | POA: Diagnosis not present

## 2023-08-20 DIAGNOSIS — D638 Anemia in other chronic diseases classified elsewhere: Secondary | ICD-10-CM | POA: Diagnosis not present

## 2023-08-20 DIAGNOSIS — J948 Other specified pleural conditions: Secondary | ICD-10-CM | POA: Diagnosis not present

## 2023-08-20 DIAGNOSIS — I517 Cardiomegaly: Secondary | ICD-10-CM | POA: Diagnosis not present

## 2023-08-20 DIAGNOSIS — I340 Nonrheumatic mitral (valve) insufficiency: Secondary | ICD-10-CM | POA: Diagnosis not present

## 2023-08-20 DIAGNOSIS — I081 Rheumatic disorders of both mitral and tricuspid valves: Secondary | ICD-10-CM | POA: Diagnosis present

## 2023-08-20 DIAGNOSIS — I272 Pulmonary hypertension, unspecified: Secondary | ICD-10-CM | POA: Diagnosis not present

## 2023-08-20 DIAGNOSIS — J45909 Unspecified asthma, uncomplicated: Secondary | ICD-10-CM | POA: Diagnosis present

## 2023-08-20 DIAGNOSIS — N390 Urinary tract infection, site not specified: Secondary | ICD-10-CM | POA: Diagnosis present

## 2023-08-20 DIAGNOSIS — J439 Emphysema, unspecified: Secondary | ICD-10-CM | POA: Diagnosis present

## 2023-08-20 DIAGNOSIS — R52 Pain, unspecified: Secondary | ICD-10-CM

## 2023-08-20 DIAGNOSIS — C7951 Secondary malignant neoplasm of bone: Secondary | ICD-10-CM | POA: Diagnosis not present

## 2023-08-20 DIAGNOSIS — D696 Thrombocytopenia, unspecified: Secondary | ICD-10-CM

## 2023-08-20 DIAGNOSIS — I361 Nonrheumatic tricuspid (valve) insufficiency: Secondary | ICD-10-CM | POA: Diagnosis not present

## 2023-08-20 DIAGNOSIS — I10 Essential (primary) hypertension: Secondary | ICD-10-CM | POA: Diagnosis present

## 2023-08-20 DIAGNOSIS — Z7401 Bed confinement status: Secondary | ICD-10-CM | POA: Diagnosis not present

## 2023-08-20 DIAGNOSIS — R404 Transient alteration of awareness: Secondary | ICD-10-CM | POA: Diagnosis not present

## 2023-08-20 DIAGNOSIS — Z7901 Long term (current) use of anticoagulants: Secondary | ICD-10-CM

## 2023-08-20 DIAGNOSIS — I700 Atherosclerosis of aorta: Secondary | ICD-10-CM | POA: Diagnosis not present

## 2023-08-20 DIAGNOSIS — E8809 Other disorders of plasma-protein metabolism, not elsewhere classified: Secondary | ICD-10-CM | POA: Diagnosis not present

## 2023-08-20 DIAGNOSIS — R0609 Other forms of dyspnea: Secondary | ICD-10-CM | POA: Diagnosis not present

## 2023-08-20 DIAGNOSIS — C50919 Malignant neoplasm of unspecified site of unspecified female breast: Secondary | ICD-10-CM | POA: Diagnosis present

## 2023-08-20 DIAGNOSIS — Z515 Encounter for palliative care: Secondary | ICD-10-CM

## 2023-08-20 DIAGNOSIS — E785 Hyperlipidemia, unspecified: Secondary | ICD-10-CM | POA: Diagnosis present

## 2023-08-20 DIAGNOSIS — Z48813 Encounter for surgical aftercare following surgery on the respiratory system: Secondary | ICD-10-CM | POA: Diagnosis not present

## 2023-08-20 DIAGNOSIS — Z923 Personal history of irradiation: Secondary | ICD-10-CM

## 2023-08-20 DIAGNOSIS — Z79899 Other long term (current) drug therapy: Secondary | ICD-10-CM | POA: Diagnosis not present

## 2023-08-20 DIAGNOSIS — R6889 Other general symptoms and signs: Secondary | ICD-10-CM | POA: Diagnosis not present

## 2023-08-20 DIAGNOSIS — J90 Pleural effusion, not elsewhere classified: Secondary | ICD-10-CM | POA: Diagnosis present

## 2023-08-20 DIAGNOSIS — I878 Other specified disorders of veins: Secondary | ICD-10-CM | POA: Diagnosis not present

## 2023-08-20 DIAGNOSIS — J9811 Atelectasis: Secondary | ICD-10-CM | POA: Diagnosis not present

## 2023-08-20 DIAGNOSIS — R630 Anorexia: Secondary | ICD-10-CM | POA: Diagnosis present

## 2023-08-20 DIAGNOSIS — E1122 Type 2 diabetes mellitus with diabetic chronic kidney disease: Secondary | ICD-10-CM | POA: Diagnosis present

## 2023-08-20 DIAGNOSIS — R7402 Elevation of levels of lactic acid dehydrogenase (LDH): Secondary | ICD-10-CM | POA: Diagnosis not present

## 2023-08-20 DIAGNOSIS — R0989 Other specified symptoms and signs involving the circulatory and respiratory systems: Secondary | ICD-10-CM | POA: Diagnosis not present

## 2023-08-20 DIAGNOSIS — Z6822 Body mass index (BMI) 22.0-22.9, adult: Secondary | ICD-10-CM

## 2023-08-20 DIAGNOSIS — T451X5A Adverse effect of antineoplastic and immunosuppressive drugs, initial encounter: Secondary | ICD-10-CM | POA: Diagnosis not present

## 2023-08-20 DIAGNOSIS — Z8249 Family history of ischemic heart disease and other diseases of the circulatory system: Secondary | ICD-10-CM

## 2023-08-20 DIAGNOSIS — I482 Chronic atrial fibrillation, unspecified: Secondary | ICD-10-CM | POA: Diagnosis present

## 2023-08-20 MED ORDER — LACTATED RINGERS IV BOLUS
500.0000 mL | Freq: Once | INTRAVENOUS | Status: AC
  Administered 2023-08-20: 500 mL via INTRAVENOUS

## 2023-08-20 NOTE — ED Provider Notes (Signed)
 [place] EMERGENCY DEPARTMENT AT [facility] Provider Note   CSN: [id number] Arrival date & time: 08/20/23  2144     Patient presents with: No chief complaint on file.   Patricia [name] is a 83 y.o. female.   HPI Patient has metastatic adenocarcinoma breast cancer.  She underwent radiation to the ninth left rib\L5 vertebral body in 2023 and just completed radiation therapy to T11 last month.  Chemotherapy on hold.  Patient's husband reports she has gotten progressively weak.  Patient also has cognitive impairment with short-term memory loss.  He reports that progressively she went from using a cane to a walker and has had decreased appetite.  He reports she continues to take fluids but is not eating much.  No fevers no chills.  He reports that she intermittently has pain in the area of the hip where there is a metastatic tumor but is taking tramadol  for pain with some success.  He reports that comes emergency department with concerns for increasing weakness and weight loss with poor oral intake.  Patient is denying any pain currently.    Prior to Admission medications   Medication Sig Start Date End Date Taking? Authorizing Provider  albuterol  (PROVENTIL  HFA;VENTOLIN  HFA) 108 (90 Base) MCG/ACT inhaler Inhale 2 puffs into the lungs every 6 (six) hours as needed for wheezing or shortness of breath.    [provider]  apixaban  (ELIQUIS ) 2.5 MG TABS tablet Take 1 tablet (2.5 mg total) by mouth 2 (two) times daily. 07/28/23   Jacqueline Matsu, MD  Apoaequorin (PREVAGEN) 10 MG CAPS Take 1 capsule by mouth every morning. 11/28/20   Boscia, Heather E, NP  atorvastatin  (LIPITOR) 40 MG tablet TAKE 1 TABLET DAILY BEFORE SUPPER 05/31/23   Turner, Traci R, MD  bimatoprost (LUMIGAN) 0.01 % SOLN Place 1 drop into both eyes 2 (two) times daily.    [provider]  calcium -vitamin D  (OSCAL WITH D) 500-200 MG-UNIT tablet Take 1 tablet by mouth daily with breakfast. 11/03/17   Opalski,  Bernardo Bridgeman, DO  Cholecalciferol  (VITAMIN D3) 50 MCG (2000 UT) capsule Take 2,000 Units by mouth daily.    [provider]  dexamethasone  (DECADRON ) 4 MG tablet Take 2 tablets (8 mg) by mouth daily for 3 days starting the day after chemotherapy. Take with food. 07/02/23   Ivor Mars, MD  digoxin  (LANOXIN ) 0.125 MG tablet Take 1 tablet (0.125 mg total) by mouth daily. 09/04/22   Von Grumbling, PA-C  donepezil  (ARICEPT ) 5 MG tablet Take 1 tablet (5 mg total) by mouth at bedtime. 04/19/23   Johny Nap, NP  dorzolamide -timolol  (COSOPT ) 22.3-6.8 MG/ML ophthalmic solution Place 1 drop into both eyes at bedtime.    [provider]  dronabinol (MARINOL) 2.5 MG capsule Take 1 capsule (2.5 mg total) by mouth 2 (two) times daily before lunch and supper. 08/13/23   Ivor Mars, MD  fluticasone  (FLONASE ) 50 MCG/ACT nasal spray 1 spray each nostril following sinus rinses twice daily 02/08/18   Marceil Sensor, DO  fluticasone -salmeterol (ADVAIR  HFA) 230-21 MCG/ACT inhaler USE 2 INHALATIONS TWICE A DAY 06/25/22   Maryclare Smoke A, PA  folic acid  (FOLVITE ) 400 MCG tablet Take 400 mcg by mouth daily.    [provider]  furosemide  (LASIX ) 20 MG tablet TAKE 1 TABLET TWICE A DAY. TAKE ADDITIONAL 1 TABLET AS NEEDED FOR SWELLING 05/07/23   Jacqueline Matsu, MD  gabapentin  (NEURONTIN ) 400 MG capsule TAKE 1 CAPSULE TWICE A DAY 11/17/22  Maryclare Smoke A, PA  L-Methylfolate-B12-B6-B2 (CEREFOLIN) 07-31-48-5 MG TABS Take 1 tablet by mouth every morning. 03/15/23   Johny Nap, NP  levETIRAcetam  (KEPPRA  XR) 500 MG 24 hr tablet TAKE 1 TABLET AT BEDTIME 09/07/22   Johny Nap, NP  lidocaine -prilocaine  (EMLA ) cream Apply a dime size of cream to port-a-cath 1-2 hours prior to access. Cover with [employer]. 11/09/22   Ivor Mars, MD  loratadine  (CLARITIN ) 10 MG tablet Take 10 mg by mouth daily.    [provider]  megestrol (MEGACE) 400 MG/10ML suspension Take 10 mLs (400 mg total) by  mouth 2 (two) times daily. 08/13/23   Ivor Mars, MD  memantine  (NAMENDA ) 10 MG tablet TAKE 1 TABLET TWICE A DAY 09/15/22   Sethi, Pramod S, MD  metoprolol  tartrate (LOPRESSOR ) 50 MG tablet TAKE 1 TABLET TWICE A DAY 08/11/23   Jacqueline Matsu, MD  Multiple Vitamins-Iron (MULTIVITAMIN/IRON) TABS Take 1 tablet by mouth daily.    [provider]  Netarsudil  Dimesylate (RHOPRESSA) 0.02 % SOLN Place 1 drop into both eyes at bedtime.    [provider]  ondansetron  (ZOFRAN ) 8 MG tablet Take 1 tablet (8 mg total) by mouth every 8 (eight) hours as needed for nausea or vomiting. Start on the third day after chemotherapy. 07/21/22   Ivor Mars, MD  OVER THE COUNTER MEDICATION Take 1 tablet by mouth daily. Zyflamend otc herbal supplement    [provider]  pantoprazole  (PROTONIX ) 40 MG tablet TAKE 1 TABLET DAILY 02/17/23   Maryclare Smoke A, PA  potassium chloride  SA (KLOR-CON  M) 20 MEQ tablet Take 1 tablet (20 mEq total) by mouth daily. 01/15/23   Von Grumbling, PA-C  prochlorperazine  (COMPAZINE ) 10 MG tablet Take 1 tablet (10 mg total) by mouth every 6 (six) hours as needed for nausea or vomiting. 07/21/22   Ivor Mars, MD  sucralfate  (CARAFATE ) 1 g tablet Take 1 tablet (1 g total) by mouth 4 (four) times daily -  with meals and at bedtime. 07/29/23   Pearlene Bouchard, PA-C  thyroid  (ARMOUR THYROID ) 90 MG tablet Take 1 tablet (90 mg total) by mouth daily. 08/07/23   Odilia Bennett, PA-C  traMADol  (ULTRAM ) 50 MG tablet Take 1 tablet (50 mg total) by mouth every 6 (six) hours as needed (post op pain). 07/02/23   Ivor Mars, MD    Allergies: Combigan [brimonidine tartrate-timolol ], Sulfa antibiotics, and Sulfamethoxazole-trimethoprim    Review of Systems  Updated Vital Signs BP 119/77   Pulse 81   Temp 98.7 F (37.1 C) (Oral)   Resp 18   SpO2 96%   Physical Exam Constitutional:      Comments: The patient is awake.  Alert.  No respiratory distress at rest.   Chronically ill appearance.  HENT:     Mouth/Throat:     Pharynx: Oropharynx is clear.   Eyes:     Extraocular Movements: Extraocular movements intact.    Cardiovascular:     Rate and Rhythm: Normal rate and regular rhythm.  Pulmonary:     Comments: No respiratory distress at rest.  Decreased breath sounds on the left lower lung field Abdominal:     General: There is no distension.     Palpations: Abdomen is soft.     Tenderness: There is no abdominal tenderness. There is no guarding.   Musculoskeletal:     Comments: No significant peripheral edema.  Calves are soft and pliable.  Patient is able to  spontaneously flex and extend at the hips.  Currently she is not having pain with flexion and extension at the hips.   Skin:    General: Skin is warm and dry.     Coloration: Skin is pale.   Neurological:     Comments: Patient is awake and alert.  She is system following commands.  She does seem to have short-term memory loss and answer some simple questions but often replies she does not remember.  No focal motor deficits.  Generally weak.  Psychiatric:        Mood and Affect: Mood normal.     (all labs ordered are listed, but only abnormal results are displayed) Labs Reviewed  COMPREHENSIVE METABOLIC PANEL WITH GFR  LIPASE, BLOOD  CBC WITH DIFFERENTIAL/PLATELET  URINALYSIS, ROUTINE W REFLEX MICROSCOPIC  MAGNESIUM   PHOSPHORUS    EKG: None  Radiology: CT Head Wo Contrast Result Date: 08/20/2023 CLINICAL DATA:  Mental status change, unknown cause EXAM: CT HEAD WITHOUT CONTRAST TECHNIQUE: Contiguous axial images were obtained from the base of the skull through the vertex without intravenous contrast. RADIATION DOSE REDUCTION: This exam was performed according to the departmental dose-optimization program which includes automated exposure control, adjustment of the mA and/or kV according to patient size and/or use of iterative reconstruction technique. COMPARISON:  04/29/2015  FINDINGS: Brain: Normal anatomic configuration. Progressive parenchymal volume loss is present, with global parenchymal volume commensurate with the patient's age. Mild periventricular white matter changes are present likely reflecting the sequela of small vessel ischemia. No abnormal intra or extra-axial mass lesion or fluid collection. No abnormal mass effect or midline shift. No evidence of acute intracranial hemorrhage or infarct. Ventricular size is normal. Cerebellum unremarkable. Vascular: No asymmetric hyperdense vasculature at the skull base. Skull: No acute fracture. Interval development of multiple sclerotic foci within the calvarium compatible with sclerotic metastatic disease. Sinuses/Orbits: Paranasal sinuses are clear. Ocular lenses have been removed. Orbits are otherwise unremarkable. Other: Mastoid air cells and middle ear cavities are clear. IMPRESSION: 1. No evidence of acute intracranial abnormality. 2. Progressive global parenchymal volume loss. 3. Interval development of multiple sclerotic foci within the calvarium compatible with sclerotic metastatic disease. Electronically Signed   By: Worthy Heads M.D.   On: 08/20/2023 23:48   DG Chest Port 1 View Result Date: 08/20/2023 CLINICAL DATA:  Generalized weakness, breast cancer EXAM: PORTABLE CHEST 1 VIEW COMPARISON:  05/10/2023 FINDINGS: Moderate left pleural effusion has developed with compressive atelectasis of the left lung base. Right lung is clear. No pneumothorax. No pleural effusion on the right. Right internal jugular chest port tip seen within the superior vena cava. Cardiac size is stable. Pulmonary vascularity is normal. Surgical clips are seen within the left axilla. IMPRESSION: 1. Interval development of moderate left pleural effusion. Electronically Signed   By: Worthy Heads M.D.   On: 08/20/2023 23:30     Procedures   Medications Ordered in the ED  lactated ringers  bolus 500 mL (500 mLs Intravenous New Bag/Given  08/20/23 2357)                                    Medical Decision Making Amount and/or Complexity of Data Reviewed Labs: ordered. Radiology: ordered.   Patient presents as outlined.  She has complex medical history with metastatic breast cancer.  At this time she is having progressive weakness and loss of appetite and dysfunction at home.  Will proceed with  evaluation for metabolic derangement\infectious illness\metastatic complications.  Patient CT head interpreted radiology no acute intracranial finding but multiple sclerotic lesions of the skull consistent with metastatic disease. 1 view chest x-ray reviewed by radiology and also personally reviewed by myself moderate developing left pleural effusion.  Labs pending.  At this time findings are consistent with progression of patient's metastatic cancer.    If patient has significant metabolic derangement pain may need admission for correction and treatment.  If all findings are stable in terms of lab work, patient may be appropriate for continued home management if patient's husband is able to manage with adequate support at home.       Final diagnoses:  Metastatic malignant neoplasm, unspecified site [facility])  Weakness generalized    ED Discharge Orders     None          Wynetta Heckle, MD 08/21/23 0000

## 2023-08-20 NOTE — ED Triage Notes (Signed)
 Pt BIBA from home c/o generalized weakness and hip pain after last chemo tx last week.  PTA  126/88 97%RA Afib on monitor 84-108

## 2023-08-21 DIAGNOSIS — C50919 Malignant neoplasm of unspecified site of unspecified female breast: Secondary | ICD-10-CM | POA: Diagnosis not present

## 2023-08-21 DIAGNOSIS — R531 Weakness: Secondary | ICD-10-CM

## 2023-08-21 DIAGNOSIS — T451X5A Adverse effect of antineoplastic and immunosuppressive drugs, initial encounter: Secondary | ICD-10-CM | POA: Diagnosis not present

## 2023-08-21 DIAGNOSIS — C7951 Secondary malignant neoplasm of bone: Secondary | ICD-10-CM

## 2023-08-21 DIAGNOSIS — D696 Thrombocytopenia, unspecified: Secondary | ICD-10-CM

## 2023-08-21 DIAGNOSIS — D6481 Anemia due to antineoplastic chemotherapy: Secondary | ICD-10-CM

## 2023-08-21 LAB — IRON AND TIBC
Iron: 71 ug/dL (ref 28–170)
Saturation Ratios: 32 % — ABNORMAL HIGH (ref 10.4–31.8)
TIBC: 224 ug/dL — ABNORMAL LOW (ref 250–450)
UIBC: 153 ug/dL

## 2023-08-21 LAB — URINALYSIS, ROUTINE W REFLEX MICROSCOPIC
Bacteria, UA: NONE SEEN
Bilirubin Urine: NEGATIVE
Glucose, UA: NEGATIVE mg/dL
Hgb urine dipstick: NEGATIVE
Ketones, ur: NEGATIVE mg/dL
Nitrite: NEGATIVE
Protein, ur: 30 mg/dL — AB
Specific Gravity, Urine: 1.02 (ref 1.005–1.030)
WBC, UA: >50 WBC/hpf (ref 0–5)
pH: 6 (ref 5.0–8.0)

## 2023-08-21 LAB — MAGNESIUM: Magnesium: 2.3 mg/dL (ref 1.7–2.4)

## 2023-08-21 LAB — COMPREHENSIVE METABOLIC PANEL WITH GFR
ALT: 46 U/L — ABNORMAL HIGH (ref 0–44)
AST: 50 U/L — ABNORMAL HIGH (ref 15–41)
Albumin: 2.8 g/dL — ABNORMAL LOW (ref 3.5–5.0)
Alkaline Phosphatase: 68 U/L (ref 38–126)
Anion gap: 8 (ref 5–15)
BUN: 25 mg/dL — ABNORMAL HIGH (ref 8–23)
CO2: 24 mmol/L (ref 22–32)
Calcium: 8.2 mg/dL — ABNORMAL LOW (ref 8.9–10.3)
Chloride: 104 mmol/L (ref 98–111)
Creatinine, Ser: 0.78 mg/dL (ref 0.44–1.00)
GFR, Estimated: >60 mL/min (ref >60)
Glucose, Bld: 121 mg/dL — ABNORMAL HIGH (ref 70–99)
Potassium: 3.7 mmol/L (ref 3.5–5.1)
Sodium: 136 mmol/L (ref 135–145)
Total Bilirubin: 0.9 mg/dL (ref 0.0–1.2)
Total Protein: 5.6 g/dL — ABNORMAL LOW (ref 6.5–8.1)

## 2023-08-21 LAB — CBC WITH DIFFERENTIAL/PLATELET
Abs Immature Granulocytes: 0.06 10*3/uL (ref 0.00–0.07)
Basophils Absolute: 0 10*3/uL (ref 0.0–0.1)
Basophils Relative: 0 %
Eosinophils Absolute: 0 10*3/uL (ref 0.0–0.5)
Eosinophils Relative: 0 %
HCT: 30.5 % — ABNORMAL LOW (ref 36.0–46.0)
Hemoglobin: 9.9 g/dL — ABNORMAL LOW (ref 12.0–15.0)
Immature Granulocytes: 1 %
Lymphocytes Relative: 5 %
Lymphs Abs: 0.2 10*3/uL — ABNORMAL LOW (ref 0.7–4.0)
MCH: 34.6 pg — ABNORMAL HIGH (ref 26.0–34.0)
MCHC: 32.5 g/dL (ref 30.0–36.0)
MCV: 106.6 fL — ABNORMAL HIGH (ref 80.0–100.0)
Monocytes Absolute: 0.4 10*3/uL (ref 0.1–1.0)
Monocytes Relative: 8 %
Neutro Abs: 3.5 10*3/uL (ref 1.7–7.7)
Neutrophils Relative %: 86 %
Platelets: 57 10*3/uL — ABNORMAL LOW (ref 150–400)
RBC: 2.86 MIL/uL — ABNORMAL LOW (ref 3.87–5.11)
RDW: 16.3 % — ABNORMAL HIGH (ref 11.5–15.5)
WBC: 4.2 10*3/uL (ref 4.0–10.5)
nRBC: 0 % (ref 0.0–0.2)

## 2023-08-21 LAB — FERRITIN: Ferritin: 438 ng/mL — ABNORMAL HIGH (ref 11–307)

## 2023-08-21 LAB — POC OCCULT BLOOD, ED: Fecal Occult Bld: NEGATIVE

## 2023-08-21 LAB — VITAMIN B12: Vitamin B-12: 404 pg/mL (ref 180–914)

## 2023-08-21 LAB — RETICULOCYTES
RBC.: 2.79 MIL/uL — ABNORMAL LOW (ref 3.87–5.11)
Retic Ct Pct: <0.4 % — ABNORMAL LOW (ref 0.4–3.1)

## 2023-08-21 LAB — CBC
HCT: 36.1 % (ref 36.0–46.0)
Hemoglobin: 12.1 g/dL (ref 12.0–15.0)
MCH: 33.4 pg (ref 26.0–34.0)
MCHC: 33.5 g/dL (ref 30.0–36.0)
MCV: 99.7 fL (ref 80.0–100.0)
Platelets: 51 10*3/uL — ABNORMAL LOW (ref 150–400)
RBC: 3.62 MIL/uL — ABNORMAL LOW (ref 3.87–5.11)
RDW: 19.9 % — ABNORMAL HIGH (ref 11.5–15.5)
WBC: 4.3 10*3/uL (ref 4.0–10.5)
nRBC: 0 % (ref 0.0–0.2)

## 2023-08-21 LAB — PHOSPHORUS: Phosphorus: 2.3 mg/dL — ABNORMAL LOW (ref 2.5–4.6)

## 2023-08-21 LAB — FOLATE: Folate: >40 ng/mL (ref >5.9)

## 2023-08-21 LAB — LIPASE, BLOOD: Lipase: 40 U/L (ref 11–51)

## 2023-08-21 LAB — PREPARE RBC (CROSSMATCH)

## 2023-08-21 MED ORDER — GABAPENTIN 400 MG PO CAPS
400.0000 mg | ORAL_CAPSULE | Freq: Two times a day (BID) | ORAL | Status: DC
  Administered 2023-08-21 – 2023-09-09 (×38): 400 mg via ORAL
  Filled 2023-08-21 (×38): qty 1

## 2023-08-21 MED ORDER — APOAEQUORIN 10 MG PO CAPS: 1.0000 | ORAL_CAPSULE | Freq: Every morning | ORAL | Status: DC

## 2023-08-21 MED ORDER — SODIUM CHLORIDE 0.9 % IV BOLUS
1000.0000 mL | Freq: Once | INTRAVENOUS | Status: AC
  Administered 2023-08-21: 1000 mL via INTRAVENOUS

## 2023-08-21 MED ORDER — DRONABINOL 2.5 MG PO CAPS: 2.5000 mg | ORAL_CAPSULE | Freq: Two times a day (BID) | ORAL | Status: DC

## 2023-08-21 MED ORDER — METOPROLOL TARTRATE 50 MG PO TABS
50.0000 mg | ORAL_TABLET | Freq: Two times a day (BID) | ORAL | Status: DC
  Administered 2023-08-21 – 2023-08-28 (×14): 50 mg via ORAL
  Filled 2023-08-21 (×16): qty 1

## 2023-08-21 MED ORDER — TRAMADOL HCL 50 MG PO TABS
50.0000 mg | ORAL_TABLET | Freq: Four times a day (QID) | ORAL | Status: DC | PRN
  Administered 2023-08-28 – 2023-08-31 (×4): 50 mg via ORAL
  Filled 2023-08-21 (×4): qty 1

## 2023-08-21 MED ORDER — SENNOSIDES-DOCUSATE SODIUM 8.6-50 MG PO TABS: 1.0000 | ORAL_TABLET | Freq: Every evening | ORAL | Status: DC | PRN

## 2023-08-21 MED ORDER — CEREFOLIN 6-1-50-5 MG PO TABS: 1.0000 | ORAL_TABLET | Freq: Every morning | ORAL | Status: DC

## 2023-08-21 MED ORDER — ONDANSETRON HCL 4 MG/2ML IJ SOLN
4.0000 mg | Freq: Four times a day (QID) | INTRAMUSCULAR | Status: DC | PRN
  Administered 2023-08-31: 4 mg via INTRAVENOUS
  Filled 2023-08-21: qty 2

## 2023-08-21 MED ORDER — B COMPLEX-C PO TABS
1.0000 | ORAL_TABLET | Freq: Every day | ORAL | Status: DC
  Administered 2023-08-21 – 2023-08-31 (×11): 1 via ORAL
  Filled 2023-08-21 (×11): qty 1

## 2023-08-21 MED ORDER — FLUTICASONE PROPIONATE 50 MCG/ACT NA SUSP
1.0000 | Freq: Two times a day (BID) | NASAL | Status: DC
  Administered 2023-08-21 – 2023-09-08 (×37): 1 via NASAL
  Filled 2023-08-21: qty 16

## 2023-08-21 MED ORDER — TAB-A-VITE/IRON PO TABS
1.0000 | ORAL_TABLET | Freq: Every day | ORAL | Status: DC
  Administered 2023-08-21 – 2023-08-31 (×11): 1 via ORAL
  Filled 2023-08-21 (×12): qty 1

## 2023-08-21 MED ORDER — FUROSEMIDE 20 MG PO TABS
20.0000 mg | ORAL_TABLET | Freq: Two times a day (BID) | ORAL | Status: DC
  Administered 2023-08-21 – 2023-08-24 (×6): 20 mg via ORAL
  Filled 2023-08-21 (×6): qty 1

## 2023-08-21 MED ORDER — LORATADINE 10 MG PO TABS
10.0000 mg | ORAL_TABLET | Freq: Every day | ORAL | Status: DC
  Administered 2023-08-21 – 2023-08-31 (×11): 10 mg via ORAL
  Filled 2023-08-21 (×11): qty 1

## 2023-08-21 MED ORDER — DONEPEZIL HCL 10 MG PO TABS
5.0000 mg | ORAL_TABLET | Freq: Every day | ORAL | Status: DC
  Administered 2023-08-21 – 2023-08-31 (×11): 5 mg via ORAL
  Filled 2023-08-21 (×11): qty 1

## 2023-08-21 MED ORDER — OYSTER SHELL CALCIUM/D3 500-5 MG-MCG PO TABS
1.0000 | ORAL_TABLET | Freq: Every day | ORAL | Status: DC
  Administered 2023-08-22 – 2023-08-31 (×10): 1 via ORAL
  Filled 2023-08-21 (×12): qty 1

## 2023-08-21 MED ORDER — MEMANTINE HCL 10 MG PO TABS
10.0000 mg | ORAL_TABLET | Freq: Two times a day (BID) | ORAL | Status: DC
  Administered 2023-08-21 – 2023-08-31 (×22): 10 mg via ORAL
  Filled 2023-08-21 (×22): qty 1

## 2023-08-21 MED ORDER — SODIUM CHLORIDE 0.9% IV SOLUTION: Freq: Once | INTRAVENOUS | Status: AC

## 2023-08-21 MED ORDER — ONDANSETRON HCL 4 MG PO TABS
4.0000 mg | ORAL_TABLET | Freq: Four times a day (QID) | ORAL | Status: DC | PRN
  Administered 2023-08-26: 4 mg via ORAL
  Filled 2023-08-21: qty 1

## 2023-08-21 MED ORDER — APIXABAN 2.5 MG PO TABS: 2.5000 mg | ORAL_TABLET | Freq: Two times a day (BID) | ORAL | Status: DC

## 2023-08-21 MED ORDER — VITAMIN D 25 MCG (1000 UNIT) PO TABS
2000.0000 [IU] | ORAL_TABLET | Freq: Every day | ORAL | Status: DC
  Administered 2023-08-21 – 2023-08-31 (×11): 2000 [IU] via ORAL
  Filled 2023-08-21 (×11): qty 2

## 2023-08-21 MED ORDER — ACETAMINOPHEN 325 MG PO TABS
650.0000 mg | ORAL_TABLET | Freq: Four times a day (QID) | ORAL | Status: DC | PRN
  Administered 2023-08-21 – 2023-09-02 (×5): 650 mg via ORAL
  Filled 2023-08-21 (×5): qty 2

## 2023-08-21 MED ORDER — ATORVASTATIN CALCIUM 40 MG PO TABS
40.0000 mg | ORAL_TABLET | Freq: Every evening | ORAL | Status: DC
  Administered 2023-08-21 – 2023-08-31 (×11): 40 mg via ORAL
  Filled 2023-08-21 (×11): qty 1

## 2023-08-21 MED ORDER — DIGOXIN 125 MCG PO TABS
0.1250 mg | ORAL_TABLET | Freq: Every day | ORAL | Status: DC
  Administered 2023-08-21 – 2023-09-09 (×18): 0.125 mg via ORAL
  Filled 2023-08-21 (×19): qty 1

## 2023-08-21 MED ORDER — FOLIC ACID 1 MG PO TABS
500.0000 ug | ORAL_TABLET | Freq: Every day | ORAL | Status: DC
  Administered 2023-08-21 – 2023-08-31 (×11): 0.5 mg via ORAL
  Filled 2023-08-21 (×11): qty 1

## 2023-08-21 MED ORDER — DRONABINOL 5 MG PO CAPS
5.0000 mg | ORAL_CAPSULE | Freq: Two times a day (BID) | ORAL | Status: DC
  Administered 2023-08-21 – 2023-08-31 (×21): 5 mg via ORAL
  Filled 2023-08-21 (×21): qty 1

## 2023-08-21 MED ORDER — LEVETIRACETAM ER 500 MG PO TB24
500.0000 mg | ORAL_TABLET | Freq: Every day | ORAL | Status: DC
  Administered 2023-08-21 – 2023-09-08 (×19): 500 mg via ORAL
  Filled 2023-08-21 (×19): qty 1

## 2023-08-21 MED ORDER — ACETAMINOPHEN 650 MG RE SUPP: 650.0000 mg | Freq: Four times a day (QID) | RECTAL | Status: DC | PRN

## 2023-08-21 MED ORDER — MEGESTROL ACETATE 400 MG/10ML PO SUSP
400.0000 mg | Freq: Two times a day (BID) | ORAL | Status: DC
  Administered 2023-08-21 – 2023-08-31 (×22): 400 mg via ORAL
  Filled 2023-08-21 (×22): qty 10

## 2023-08-21 MED ORDER — FLUTICASONE FUROATE-VILANTEROL 200-25 MCG/ACT IN AEPB
1.0000 | INHALATION_SPRAY | Freq: Every day | RESPIRATORY_TRACT | Status: DC
  Administered 2023-08-21 – 2023-09-07 (×17): 1 via RESPIRATORY_TRACT
  Filled 2023-08-21 (×2): qty 28

## 2023-08-21 MED ORDER — PANTOPRAZOLE SODIUM 40 MG PO TBEC
40.0000 mg | DELAYED_RELEASE_TABLET | Freq: Every day | ORAL | Status: DC
  Administered 2023-08-21 – 2023-09-09 (×19): 40 mg via ORAL
  Filled 2023-08-21 (×19): qty 1

## 2023-08-21 MED ORDER — ENSURE PLUS HIGH PROTEIN PO LIQD
237.0000 mL | Freq: Three times a day (TID) | ORAL | Status: DC
  Administered 2023-08-21 – 2023-08-31 (×24): 237 mL via ORAL
  Filled 2023-08-21 (×2): qty 237

## 2023-08-21 MED ORDER — THYROID 60 MG PO TABS
90.0000 mg | ORAL_TABLET | Freq: Every day | ORAL | Status: DC
  Administered 2023-08-21 – 2023-09-09 (×19): 90 mg via ORAL
  Filled 2023-08-21 (×20): qty 1

## 2023-08-21 MED ORDER — LATANOPROST 0.005 % OP SOLN
1.0000 [drp] | Freq: Two times a day (BID) | OPHTHALMIC | Status: DC
  Administered 2023-08-21 – 2023-09-08 (×37): 1 [drp] via OPHTHALMIC
  Filled 2023-08-21: qty 2.5

## 2023-08-21 MED ORDER — SODIUM CHLORIDE 0.9 % IV SOLN
1.0000 g | INTRAVENOUS | Status: AC
  Administered 2023-08-21 – 2023-08-25 (×5): 1 g via INTRAVENOUS
  Filled 2023-08-21 (×5): qty 10

## 2023-08-21 MED ORDER — POTASSIUM CHLORIDE CRYS ER 20 MEQ PO TBCR
20.0000 meq | EXTENDED_RELEASE_TABLET | Freq: Every day | ORAL | Status: DC
  Administered 2023-08-21 – 2023-08-22 (×2): 20 meq via ORAL
  Filled 2023-08-21 (×2): qty 1

## 2023-08-21 NOTE — Progress Notes (Signed)
 MEWS Progress Note  Patient Details Name: Patricia [name] MRN: [id number] DOB: 03-Feb-1941 Today's Date: 08/21/2023   MEWS Flowsheet Documentation:  Assess: MEWS Score Temp: (!) 100.5 F (38.1 C) BP: (!) 94/54 MAP (mmHg): (!) 63 Pulse Rate: 87 Resp: 18 Level of Consciousness: Alert SpO2: 94 % O2 Device: Room Air Assess: MEWS Score MEWS Temp: 1 MEWS Systolic: 1 MEWS Pulse: 0 MEWS RR: 0 MEWS LOC: 0 MEWS Score: 2 MEWS Score Color: Yellow Assess: SIRS CRITERIA SIRS Temperature : 0 SIRS Respirations : 0 SIRS Pulse: 0 SIRS WBC: 0 SIRS Score Sum : 0 SIRS Temperature : 0 SIRS Pulse: 0 SIRS Respirations : 0 SIRS WBC: 0 SIRS Score Sum : 0 Assess: if the MEWS score is Yellow or Red Were vital signs accurate and taken at a resting state?: Yes Does the patient meet 2 or more of the SIRS criteria?: No Notify: Charge Nurse/RN Name of Charge Nurse/RN Notified: Ingrid, RN Provider Notification Provider Name/Title: Lou, MD Date Provider Notified: 08/21/23 Time Provider Notified: [id number] Method of Notification: Page Notification Reason: Change in status (abnormal vitals 4 hrs post blood transfusion)      Taila Basinski L Nevin Grizzle 08/21/2023, 6:10 PM

## 2023-08-21 NOTE — H&P (Addendum)
 History and Physical  Patricia [name] FMW:[id number] DOB: [birth year] DOA: 08/20/2023  PCP: Patricia Saddie FALCON, PA-C   Chief Complaint: Generalized weakness, right hip pain  HPI: Patricia [name] is a 83 y.o. 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 Per spouse, patient has had poor appetite and weight loss over the last few weeks.  She was seen by her oncologist 2 weeks ago and started on Marinol  and Megace  to help with appetite.  Over the last few days, patient has been significantly weak and had difficulty getting in bed last night.  She has also reported right hip pain.  She has not had any abdominal pain, shortness of breath, chest pain, nausea, vomiting, bloody stools, falls, bruises, hematuria, chest pain, fevers or chills  ED Course: Initial vitals overall stable with soft BP with SBP in the 90s to 110s. Initial labs significant for WBC 4.2, Hgb 9.9 (from 12.0 about 2 weeks ago), platelet 57 (from 124 about 2 weeks ago), normal kidney function, albumin  2.8 mag 2.3, Phos 2.3, iron  studies showing anemia of chronic disease, vitamin B12 404, negative FOBT, UA shows mild proteinuria, negative nitrite, large leuks, WBC >50 with no bacteria. CXR shows moderate left pleural effusion.  CT head with no acute intracranial abnormality. Pt received IV LR 500 cc bolus. TR[facility] was consulted for admission.   Review of Systems: Please see HPI for pertinent positives and negatives. A complete 10 system review of systems are otherwise negative.  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 Dx 04/2015 at time of stroke. b. DCCV 06/2015 - did not hold. Amio started then stopped due to QT prolongation.   Chronic diastolic heart failure (HCC) 06/17/2015   CKD (chronic kidney disease), stage III (HCC)    Cough variant asthma    Dementia (HCC)    Diverticulosis    Essential hypertension    GERD (gastroesophageal reflux disease)    Glaucoma    Hiatal hernia    Hyperlipidemia    Hypothyroidism    Internal hemorrhoids    Laryngospasm    Mitral regurgitation    mild to moderate by echo 12/2019 and moderate by TEE 12/2020   Multinodular goiter    Osteopenia    Postmenopausal    Pulmonary HTN (HCC)     Moderate with PASP by echo 2018 likely Group 2 from pulmonary venous HTN from CHF>>normal PAP by echo 12/2019   Radiation 10/22/14-11/23/14   Left Breast   Stricture and stenosis of cervix    Stroke (HCC)    TIAs   Past Surgical History:  Procedure Laterality Date   BREAST LUMPECTOMY Left    BREAST SURGERY     CARDIOVERSION N/A 06/24/2015   Procedure: CARDIOVERSION;  Surgeon: Patricia JONELLE Bihari, MD;  Location: [facility];  Service: Cardiovascular;  Laterality: N/A;   CARDIOVERSION N/A 05/19/2016   Procedure: CARDIOVERSION;  Surgeon: Patricia JONELLE Bihari, MD;  Location: [facility];  Service: Cardiovascular;  Laterality: N/A;   COLONOSCOPY     EYE SURGERY Bilateral  cataract removal by Dr. Patrcia   FEMUR IM NAIL Left 07/28/2022   Procedure: INTRAMEDULLARY (IM) NAIL FEMORAL;  Surgeon: Patricia Coy, MD;  Location: [facility];  Service: Orthopedics;  Laterality: Left;   IR IMAGING GUIDED PORT INSERTION  10/30/2022   IR THORACENTESIS ASP PLEURAL SPACE W/IMG GUIDE  05/10/2023   LUNG CANCER SURGERY  2003   resection carcinoid lingula-lt upper lobe   RADIOACTIVE SEED GUIDED PARTIAL MASTECTOMY WITH AXILLARY SENTINEL LYMPH NODE BIOPSY Left 06/26/2014   Procedure: RADIOACTIVE SEED GUIDED PARTIAL MASTECTOMY WITH AXILLARY SENTINEL LYMPH NODE BIOPSY;  Surgeon:  Patricia Nephew, MD;  Location: [facility];  Service: General;  Laterality: Left;   RE-EXCISION OF BREAST LUMPECTOMY Left 08/07/2014   Procedure: RE-EXCISION OF LEFT BREAST LUMPECTOMY;  Surgeon: Patricia Nephew, MD;  Location: [facility];  Service: General;  Laterality: Left;   RIGHT HEART CATH N/A 04/27/2016   Procedure: Right Heart Cath;  Surgeon: Patricia GORMAN Shuck, MD;  Location: [facility] CV [facility];  Service: Cardiovascular;  Laterality: N/A;   TEE WITHOUT CARDIOVERSION N/A 01/07/2021   Procedure: TRANSESOPHAGEAL ECHOCARDIOGRAM (TEE);  Surgeon: Patricia Aleene PARAS, MD;  Location: [facility] ENDOSCOPY;  Service: Cardiovascular;  Laterality: N/A;   Social History:  reports that she quit smoking about 57 years ago. Her smoking use included cigarettes. She started smoking about 62 years ago. She has a 3.8 pack-year smoking history. She has been exposed to tobacco smoke. She has never used smokeless tobacco. She reports that she does not currently use alcohol. She reports that she does not use drugs.  Allergies  Allergen Reactions   Combigan [Brimonidine Tartrate-Timolol ] Itching    Per patient made eyes red, sore, and sensitivity to light   Sulfa Antibiotics Hives and Rash   Sulfamethoxazole-Trimethoprim Hives    Family History  Problem Relation Age of Onset   Stroke Mother    Hypertension Mother    Hyperlipidemia Mother    Stroke Father    Transient ischemic attack Father    Hyperlipidemia Father    Hypertension Father    Atrial fibrillation Son    Heart attack Neg Hx      Prior to Admission medications   Medication Sig Start Date End Date Taking? Authorizing Provider  albuterol  (PROVENTIL  HFA;VENTOLIN  HFA) 108 (90 Base) MCG/ACT inhaler Inhale 2 puffs into the lungs every 6 (six) hours as needed for wheezing or shortness of breath.    [provider]  apixaban  (ELIQUIS ) 2.5 MG TABS tablet Take 1 tablet (2.5 mg total) by mouth 2 (two) times daily. 07/28/23    Patricia Patricia SAUNDERS, MD  Apoaequorin (PREVAGEN) 10 MG CAPS Take 1 capsule by mouth every morning. 11/28/20   Patricia [name] E, NP  atorvastatin  (LIPITOR) 40 MG tablet TAKE 1 TABLET DAILY BEFORE SUPPER 05/31/23   Turner, Traci R, MD  bimatoprost (LUMIGAN) 0.01 % SOLN Place 1 drop into both eyes 2 (two) times daily.    [provider]  calcium -vitamin D  (OSCAL WITH D) 500-200 MG-UNIT tablet Take 1 tablet by mouth daily with breakfast. 11/03/17   Opalski, Barnie, DO  Cholecalciferol  (VITAMIN D3) 50 MCG (2000 UT) capsule Take 2,000 Units by mouth daily.    [provider]  dexamethasone  (DECADRON ) 4 MG tablet Take 2 tablets (8 mg) by mouth daily for 3 days starting the day after chemotherapy. Take with food. 07/02/23   Timmy [name] SAUNDERS, MD  digoxin  (LANOXIN ) 0.125 MG tablet Take 1 tablet (0.125 mg total) by mouth daily. 09/04/22  Lucien Orren SAILOR, PA-C  donepezil  (ARICEPT ) 5 MG tablet Take 1 tablet (5 mg total) by mouth at bedtime. 04/19/23   Whitfield Raisin, NP  dorzolamide -timolol  (COSOPT ) 22.3-6.8 MG/ML ophthalmic solution Place 1 drop into both eyes at bedtime.    [provider]  dronabinol  (MARINOL ) 2.5 MG capsule Take 1 capsule (2.5 mg total) by mouth 2 (two) times daily before lunch and supper. 08/13/23   Timmy Maude SAUNDERS, MD  fluticasone  (FLONASE ) 50 MCG/ACT nasal spray 1 spray each nostril following sinus rinses twice daily 02/08/18   Midge Sober, DO  fluticasone -salmeterol (ADVAIR  HFA) 230-21 MCG/ACT inhaler USE 2 INHALATIONS TWICE A DAY 06/25/22   Wallace Search A, PA  folic acid  (FOLVITE ) 400 MCG tablet Take 400 mcg by mouth daily.    [provider]  furosemide  (LASIX ) 20 MG tablet TAKE 1 TABLET TWICE A DAY. TAKE ADDITIONAL 1 TABLET AS NEEDED FOR SWELLING 05/07/23   Patricia Patricia SAUNDERS, MD  gabapentin  (NEURONTIN ) 400 MG capsule TAKE 1 CAPSULE TWICE A DAY 11/17/22   Wallace Search A, PA  L-Methylfolate-B12-B6-B2 (CEREFOLIN) 07-31-48-5 MG TABS Take 1 tablet by mouth every  morning. 03/15/23   Whitfield Raisin, NP  levETIRAcetam  (KEPPRA  XR) 500 MG 24 hr tablet TAKE 1 TABLET AT BEDTIME 09/07/22   Whitfield Raisin, NP  lidocaine -prilocaine  (EMLA ) cream Apply a dime size of cream to port-a-cath 1-2 hours prior to access. Cover with [employer]. 11/09/22   Timmy Maude SAUNDERS, MD  loratadine  (CLARITIN ) 10 MG tablet Take 10 mg by mouth daily.    [provider]  megestrol  (MEGACE ) 400 MG/10ML suspension Take 10 mLs (400 mg total) by mouth 2 (two) times daily. 08/13/23   Timmy Maude SAUNDERS, MD  memantine  (NAMENDA ) 10 MG tablet TAKE 1 TABLET TWICE A DAY 09/15/22   Sethi, Pramod S, MD  metoprolol  tartrate (LOPRESSOR ) 50 MG tablet TAKE 1 TABLET TWICE A DAY 08/11/23   Patricia Patricia SAUNDERS, MD  Multiple Vitamins-Iron  (MULTIVITAMIN/IRON ) TABS Take 1 tablet by mouth daily.    [provider]  Netarsudil  Dimesylate (RHOPRESSA) 0.02 % SOLN Place 1 drop into both eyes at bedtime.    [provider]  ondansetron  (ZOFRAN ) 8 MG tablet Take 1 tablet (8 mg total) by mouth every 8 (eight) hours as needed for nausea or vomiting. Start on the third day after chemotherapy. 07/21/22   Timmy Maude SAUNDERS, MD  OVER THE COUNTER MEDICATION Take 1 tablet by mouth daily. Zyflamend otc herbal supplement    [provider]  pantoprazole  (PROTONIX ) 40 MG tablet TAKE 1 TABLET DAILY 02/17/23   Wallace Search A, PA  potassium chloride  SA (KLOR-CON  M) 20 MEQ tablet Take 1 tablet (20 mEq total) by mouth daily. 01/15/23   Lucien Orren SAILOR, PA-C  prochlorperazine  (COMPAZINE ) 10 MG tablet Take 1 tablet (10 mg total) by mouth every 6 (six) hours as needed for nausea or vomiting. 07/21/22   Timmy Maude SAUNDERS, MD  sucralfate  (CARAFATE ) 1 g tablet Take 1 tablet (1 g total) by mouth 4 (four) times daily -  with meals and at bedtime. 07/29/23   Wyatt Leeroy HERO, PA-C  thyroid  (ARMOUR THYROID ) 90 MG tablet Take 1 tablet (90 mg total) by mouth daily. 08/07/23   Patricia Saddie FALCON, PA-C  traMADol  (ULTRAM ) 50 MG tablet Take 1  tablet (50 mg total) by mouth every 6 (six) hours as needed (post op pain). 07/02/23   Timmy Maude SAUNDERS, MD    Physical Exam: BP (!) 97/56   Pulse  80   Temp 97.8 F (36.6 C) (Oral)   Resp 20   SpO2 96%  General: Pleasant, chronically ill elderly woman laying in bed. No acute distress. HEENT: [place]/AT. Anicteric sclera. Dry mucous membrane CV: Regular rate. Irregular rhythm. No murmurs, rubs, or gallops. No LE edema Pulmonary: Lungs CTAB. Normal effort. No wheezing or rales. Decreased breath sounds throughout. Abdominal: Soft, nontender, nondistended. Normal bowel sounds. MSK: Mild tenderness to palpation of the right hip normal ROM. Skin: Warm and dry. No obvious rash or lesions. Neuro: A&Ox3. Moves all extremities. Normal sensation to light touch. No focal deficit. Psych: Normal mood and affect          Labs on Admission:  Basic Metabolic Panel: Recent Labs  Lab 08/20/23 2328  NA 136  K 3.7  CL 104  CO2 24  GLUCOSE 121*  BUN 25*  CREATININE 0.78  CALCIUM  8.2*  MG 2.3  PHOS 2.3*   Liver Function Tests: Recent Labs  Lab 08/20/23 2328  AST 50*  ALT 46*  ALKPHOS 68  BILITOT 0.9  PROT 5.6*  ALBUMIN  2.8*   Recent Labs  Lab 08/20/23 2328  LIPASE 40   No results for input(s): AMMONIA in the last 168 hours. CBC: Recent Labs  Lab 08/21/23 0031  WBC 4.2  NEUTROABS 3.5  HGB 9.9*  HCT 30.5*  MCV 106.6*  PLT 57*   Cardiac Enzymes: No results for input(s): CKTOTAL, CKMB, CKMBINDEX, TROPONINI in the last 168 hours. BNP (last 3 results) No results for input(s): BNP in the last 8760 hours.  ProBNP (last 3 results) No results for input(s): PROBNP in the last 8760 hours.  CBG: No results for input(s): GLUCAP in the last 168 hours.  Radiological Exams on Admission: CT Head Wo Contrast Result Date: 08/20/2023 CLINICAL DATA:  Mental status change, unknown cause EXAM: CT HEAD WITHOUT CONTRAST TECHNIQUE: Contiguous axial images were obtained from the  base of the skull through the vertex without intravenous contrast. RADIATION DOSE REDUCTION: This exam was performed according to the departmental dose-optimization program which includes automated exposure control, adjustment of the mA and/or kV according to patient size and/or use of iterative reconstruction technique. COMPARISON:  04/29/2015 FINDINGS: Brain: Normal anatomic configuration. Progressive parenchymal volume loss is present, with global parenchymal volume commensurate with the patient's age. Mild periventricular white matter changes are present likely reflecting the sequela of small vessel ischemia. No abnormal intra or extra-axial mass lesion or fluid collection. No abnormal mass effect or midline shift. No evidence of acute intracranial hemorrhage or infarct. Ventricular size is normal. Cerebellum unremarkable. Vascular: No asymmetric hyperdense vasculature at the skull base. Skull: No acute fracture. Interval development of multiple sclerotic foci within the calvarium compatible with sclerotic metastatic disease. Sinuses/Orbits: Paranasal sinuses are clear. Ocular lenses have been removed. Orbits are otherwise unremarkable. Other: Mastoid air cells and middle ear cavities are clear. IMPRESSION: 1. No evidence of acute intracranial abnormality. 2. Progressive global parenchymal volume loss. 3. Interval development of multiple sclerotic foci within the calvarium compatible with sclerotic metastatic disease. Electronically Signed   By: Dorethia Molt M.D.   On: 08/20/2023 23:48   DG Chest Port 1 View Result Date: 08/20/2023 CLINICAL DATA:  Generalized weakness, breast cancer EXAM: PORTABLE CHEST 1 VIEW COMPARISON:  05/10/2023 FINDINGS: Moderate left pleural effusion has developed with compressive atelectasis of the left lung base. Right lung is clear. No pneumothorax. No pleural effusion on the right. Right internal jugular chest port tip seen within the superior vena cava.  Cardiac size is stable.  Pulmonary vascularity is normal. Surgical clips are seen within the left axilla. IMPRESSION: 1. Interval development of moderate left pleural effusion. Electronically Signed   By: Dorethia Molt M.D.   On: 08/20/2023 23:30   Assessment/Plan Oaklyn Jakubek is a 83 y.o. 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   # Generalized weakness - Patient with metastatic breast cancer presented with generalized weakness - It is likely secondary to her acute anemia, poor p.o. intake and progression of her malignancy - PT/OT eval and treat - Registered dietitian consulted, appreciate assistance with nutrition - Start potassium supplementation - Blood transfusion as below - Fall precautions  # Pancytopenia # Anemia # Thrombocytopenia - Admission labs show drop in both Hgb and PLT from 12.0 and 124, respectively 2 weeks ago to 9.9 and 57 - Likely secondary to her chemotherapy - No evidence of active bleed or bruising - Iron  studies consistent with anemia of chronic disease, normal folate and vitamin B12 - Transfuse 1 unit PRBC - Follow-up posttransfusion CBC  # Metastatic breast cancer # Osseous metastases - Complaining of hip pain likely from her bone metastases - Followed by oncology, on Enhertu  infusions - Holding this infusion due to recent weight loss per spouse - Continue as gabapentin  and needed tramadol  - Continue Megace  and Marinol  - Continue vitamin supplementation - Dr. Timmy, patient's oncologist, added to care team  # Acute cystitis - UA shows mild proteinuria, negative nitrite, large leuks, WBC >50 but no bacteria  - Patient without any urinary or abdominal  symptoms - Initially held off antibiotics initially however patient now spiked a fever of 100.5 - Due to her immunocompromise state, will start IV Rocephin  - Follow-up urine and blood cultures  # Chronic pleural effusion - CXR on admission shows moderate left pleural effusion - Secondary to her breast malignancy - Patient without any shortness of breath, remains on room air - Supplemental O2 as needed  # A-fib - HR stable in the 80s - Continue Lopressor  - Hold Eliquis  in the setting of thrombocytopenia  # Chronic diastolic HF - Patient slightly dry on exam - Continue on digoxin , Lasix  and Lopressor  - Continue potassium supplementation  # HLD - Continue atorvastatin   # Hypothyroidism - Continue Armour - Follow-up repeat TSH  # Seizure disorder - Continue Keppra   # GERD - Continue Protonix   # Dementia - Continue Aricept  and memantine   # Asthma - Continue home bronchodilators  DVT prophylaxis: SCDs    Code Status: Full Code  Consults called: Oncology  Family Communication: Discussed admission with spouse at bedside  Severity of Illness: The appropriate patient status for this patient is OBSERVATION. Observation status is judged to be reasonable and necessary in order to provide the required intensity of service to ensure the patient's safety. The patient's presenting symptoms, physical exam findings, and initial radiographic and laboratory data in the context of their medical condition is felt to place them at decreased risk for further clinical deterioration. Furthermore, it is anticipated that the patient will be medically stable for discharge from the hospital within 2 midnights of admission.   Level of care: Med-Surg   This record has been created using Conservation officer, historic buildings. Errors have been  sought and corrected, but may not always be located. Such creation errors do not reflect on the standard of care.   Lou Claretta HERO, MD 08/21/2023, 8:15  AM [facility] Pager: [phone] Isaiah 41:10   If 7PM-7AM, please contact night-coverage [email or web address] Password [email or web address]

## 2023-08-21 NOTE — ED Provider Notes (Signed)
 I assumed care of this patient from previous provider.  Please see their note for further details of history, exam, and MDM.   Briefly patient is a 83 y.o. female who presented with generalized malaise fatigue, decreased oral intake.  Patient has a history of metastatic breast cancer not currently on chemotherapy.  Currently awaiting labs.  CBC without leukocytosis.  Notable for anemia with a hemoglobin of 9.9 down 2 g from 8 days ago.  Patient's platelets also down from 2 days ago. No electrolyte derangements or renal insufficiency.  UA without evidence of infection or hematuria.  Hemoccult was negative.  Spoke to the hospitalist service regarding her anemia.  They requested anemia panel.  They will evaluate the patient in the emergency department and determine need for admission versus transfusion and outpatient management.      Patricia Raynell Moder, MD 08/21/23 [phone]

## 2023-08-21 NOTE — Plan of Care (Signed)

## 2023-08-22 ENCOUNTER — Other Ambulatory Visit (HOSPITAL_COMMUNITY)

## 2023-08-22 ENCOUNTER — Observation Stay (HOSPITAL_COMMUNITY)

## 2023-08-22 ENCOUNTER — Encounter (HOSPITAL_COMMUNITY): Payer: Self-pay | Admitting: Student

## 2023-08-22 DIAGNOSIS — D6481 Anemia due to antineoplastic chemotherapy: Secondary | ICD-10-CM | POA: Diagnosis not present

## 2023-08-22 DIAGNOSIS — E8809 Other disorders of plasma-protein metabolism, not elsewhere classified: Secondary | ICD-10-CM | POA: Diagnosis present

## 2023-08-22 DIAGNOSIS — Z66 Do not resuscitate: Secondary | ICD-10-CM | POA: Diagnosis present

## 2023-08-22 DIAGNOSIS — R7402 Elevation of levels of lactic acid dehydrogenase (LDH): Secondary | ICD-10-CM | POA: Diagnosis not present

## 2023-08-22 DIAGNOSIS — J90 Pleural effusion, not elsewhere classified: Secondary | ICD-10-CM

## 2023-08-22 DIAGNOSIS — I5032 Chronic diastolic (congestive) heart failure: Secondary | ICD-10-CM | POA: Diagnosis present

## 2023-08-22 DIAGNOSIS — R6889 Other general symptoms and signs: Secondary | ICD-10-CM

## 2023-08-22 DIAGNOSIS — I517 Cardiomegaly: Secondary | ICD-10-CM | POA: Diagnosis not present

## 2023-08-22 DIAGNOSIS — Z48813 Encounter for surgical aftercare following surgery on the respiratory system: Secondary | ICD-10-CM | POA: Diagnosis not present

## 2023-08-22 DIAGNOSIS — F0394 Unspecified dementia, unspecified severity, with anxiety: Secondary | ICD-10-CM | POA: Diagnosis present

## 2023-08-22 DIAGNOSIS — C7951 Secondary malignant neoplasm of bone: Secondary | ICD-10-CM | POA: Diagnosis present

## 2023-08-22 DIAGNOSIS — Z515 Encounter for palliative care: Secondary | ICD-10-CM

## 2023-08-22 DIAGNOSIS — D61818 Other pancytopenia: Secondary | ICD-10-CM

## 2023-08-22 DIAGNOSIS — I878 Other specified disorders of veins: Secondary | ICD-10-CM | POA: Diagnosis not present

## 2023-08-22 DIAGNOSIS — C3492 Malignant neoplasm of unspecified part of left bronchus or lung: Secondary | ICD-10-CM | POA: Diagnosis not present

## 2023-08-22 DIAGNOSIS — N3000 Acute cystitis without hematuria: Secondary | ICD-10-CM | POA: Diagnosis present

## 2023-08-22 DIAGNOSIS — I4891 Unspecified atrial fibrillation: Secondary | ICD-10-CM | POA: Diagnosis not present

## 2023-08-22 DIAGNOSIS — F0390 Unspecified dementia without behavioral disturbance: Secondary | ICD-10-CM | POA: Diagnosis not present

## 2023-08-22 DIAGNOSIS — R945 Abnormal results of liver function studies: Secondary | ICD-10-CM | POA: Diagnosis not present

## 2023-08-22 DIAGNOSIS — I361 Nonrheumatic tricuspid (valve) insufficiency: Secondary | ICD-10-CM | POA: Diagnosis not present

## 2023-08-22 DIAGNOSIS — Z7189 Other specified counseling: Secondary | ICD-10-CM | POA: Diagnosis not present

## 2023-08-22 DIAGNOSIS — T451X5A Adverse effect of antineoplastic and immunosuppressive drugs, initial encounter: Secondary | ICD-10-CM

## 2023-08-22 DIAGNOSIS — Z1732 Human epidermal growth factor receptor 2 negative status: Secondary | ICD-10-CM

## 2023-08-22 DIAGNOSIS — E039 Hypothyroidism, unspecified: Secondary | ICD-10-CM | POA: Diagnosis present

## 2023-08-22 DIAGNOSIS — C50919 Malignant neoplasm of unspecified site of unspecified female breast: Secondary | ICD-10-CM

## 2023-08-22 DIAGNOSIS — C799 Secondary malignant neoplasm of unspecified site: Secondary | ICD-10-CM | POA: Diagnosis not present

## 2023-08-22 DIAGNOSIS — R627 Adult failure to thrive: Secondary | ICD-10-CM | POA: Diagnosis present

## 2023-08-22 DIAGNOSIS — M25551 Pain in right hip: Secondary | ICD-10-CM | POA: Diagnosis not present

## 2023-08-22 DIAGNOSIS — E1122 Type 2 diabetes mellitus with diabetic chronic kidney disease: Secondary | ICD-10-CM | POA: Diagnosis present

## 2023-08-22 DIAGNOSIS — Z8673 Personal history of transient ischemic attack (TIA), and cerebral infarction without residual deficits: Secondary | ICD-10-CM

## 2023-08-22 DIAGNOSIS — R531 Weakness: Secondary | ICD-10-CM

## 2023-08-22 DIAGNOSIS — Z711 Person with feared health complaint in whom no diagnosis is made: Secondary | ICD-10-CM | POA: Diagnosis not present

## 2023-08-22 DIAGNOSIS — Z170 Estrogen receptor positive status [ER+]: Secondary | ICD-10-CM | POA: Diagnosis not present

## 2023-08-22 DIAGNOSIS — R0609 Other forms of dyspnea: Secondary | ICD-10-CM | POA: Diagnosis not present

## 2023-08-22 DIAGNOSIS — I081 Rheumatic disorders of both mitral and tricuspid valves: Secondary | ICD-10-CM | POA: Diagnosis present

## 2023-08-22 DIAGNOSIS — I700 Atherosclerosis of aorta: Secondary | ICD-10-CM | POA: Diagnosis present

## 2023-08-22 DIAGNOSIS — J439 Emphysema, unspecified: Secondary | ICD-10-CM | POA: Diagnosis present

## 2023-08-22 DIAGNOSIS — R52 Pain, unspecified: Secondary | ICD-10-CM | POA: Diagnosis not present

## 2023-08-22 DIAGNOSIS — N390 Urinary tract infection, site not specified: Secondary | ICD-10-CM | POA: Diagnosis present

## 2023-08-22 DIAGNOSIS — I482 Chronic atrial fibrillation, unspecified: Secondary | ICD-10-CM | POA: Diagnosis present

## 2023-08-22 DIAGNOSIS — R0989 Other specified symptoms and signs involving the circulatory and respiratory systems: Secondary | ICD-10-CM | POA: Diagnosis not present

## 2023-08-22 DIAGNOSIS — I110 Hypertensive heart disease with heart failure: Secondary | ICD-10-CM | POA: Diagnosis present

## 2023-08-22 DIAGNOSIS — G40909 Epilepsy, unspecified, not intractable, without status epilepticus: Secondary | ICD-10-CM | POA: Diagnosis present

## 2023-08-22 DIAGNOSIS — I340 Nonrheumatic mitral (valve) insufficiency: Secondary | ICD-10-CM | POA: Diagnosis not present

## 2023-08-22 DIAGNOSIS — J948 Other specified pleural conditions: Secondary | ICD-10-CM | POA: Diagnosis not present

## 2023-08-22 DIAGNOSIS — Z79899 Other long term (current) drug therapy: Secondary | ICD-10-CM | POA: Diagnosis not present

## 2023-08-22 DIAGNOSIS — R188 Other ascites: Secondary | ICD-10-CM | POA: Diagnosis present

## 2023-08-22 DIAGNOSIS — D638 Anemia in other chronic diseases classified elsewhere: Secondary | ICD-10-CM | POA: Diagnosis present

## 2023-08-22 DIAGNOSIS — I272 Pulmonary hypertension, unspecified: Secondary | ICD-10-CM | POA: Diagnosis present

## 2023-08-22 LAB — COMPREHENSIVE METABOLIC PANEL WITH GFR
ALT: 50 U/L — ABNORMAL HIGH (ref 0–44)
AST: 58 U/L — ABNORMAL HIGH (ref 15–41)
Albumin: 2.8 g/dL — ABNORMAL LOW (ref 3.5–5.0)
Alkaline Phosphatase: 74 U/L (ref 38–126)
Anion gap: 9 (ref 5–15)
BUN: 22 mg/dL (ref 8–23)
CO2: 19 mmol/L — ABNORMAL LOW (ref 22–32)
Calcium: 7.9 mg/dL — ABNORMAL LOW (ref 8.9–10.3)
Chloride: 112 mmol/L — ABNORMAL HIGH (ref 98–111)
Creatinine, Ser: 0.69 mg/dL (ref 0.44–1.00)
GFR, Estimated: >60 mL/min (ref >60)
Glucose, Bld: 126 mg/dL — ABNORMAL HIGH (ref 70–99)
Potassium: 3.7 mmol/L (ref 3.5–5.1)
Sodium: 140 mmol/L (ref 135–145)
Total Bilirubin: 0.9 mg/dL (ref 0.0–1.2)
Total Protein: 5.6 g/dL — ABNORMAL LOW (ref 6.5–8.1)

## 2023-08-22 LAB — CBC
HCT: 39.3 % (ref 36.0–46.0)
Hemoglobin: 13.4 g/dL (ref 12.0–15.0)
MCH: 33.5 pg (ref 26.0–34.0)
MCHC: 34.1 g/dL (ref 30.0–36.0)
MCV: 98.3 fL (ref 80.0–100.0)
Platelets: 57 10*3/uL — ABNORMAL LOW (ref 150–400)
RBC: 4 MIL/uL (ref 3.87–5.11)
RDW: 19.8 % — ABNORMAL HIGH (ref 11.5–15.5)
WBC: 5.2 10*3/uL (ref 4.0–10.5)
nRBC: 0.4 % — ABNORMAL HIGH (ref 0.0–0.2)

## 2023-08-22 LAB — PHOSPHORUS: Phosphorus: 1.1 mg/dL — ABNORMAL LOW (ref 2.5–4.6)

## 2023-08-22 LAB — MAGNESIUM: Magnesium: 2.3 mg/dL (ref 1.7–2.4)

## 2023-08-22 LAB — TSH: TSH: 5.923 u[IU]/mL — ABNORMAL HIGH (ref 0.350–4.500)

## 2023-08-22 MED ORDER — K PHOS MONO-SOD PHOS DI & MONO 155-852-130 MG PO TABS
500.0000 mg | ORAL_TABLET | Freq: Two times a day (BID) | ORAL | Status: AC
  Administered 2023-08-22 – 2023-08-24 (×6): 500 mg via ORAL
  Filled 2023-08-22 (×6): qty 2

## 2023-08-22 MED ORDER — IOHEXOL 300 MG/ML  SOLN
100.0000 mL | Freq: Once | INTRAMUSCULAR | Status: AC | PRN
  Administered 2023-08-22: 100 mL via INTRAVENOUS

## 2023-08-22 MED ORDER — IOHEXOL 9 MG/ML PO SOLN
500.0000 mL | ORAL | Status: AC
  Administered 2023-08-22 (×2): 500 mL via ORAL

## 2023-08-22 MED ORDER — POTASSIUM PHOSPHATES 15 MMOLE/5ML IV SOLN
30.0000 mmol | Freq: Once | INTRAVENOUS | Status: AC
  Administered 2023-08-22: 30 mmol via INTRAVENOUS
  Filled 2023-08-22: qty 10

## 2023-08-22 NOTE — Consult Note (Signed)
 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  Oncology consulted for recommendations.  Palliative medicine team consulted to assist with complex medical decision making.  Extensive review of EMR prior to presenting to bedside.  Reviewed recent documentation from oncology and hospitalist.  Oncology concerned that patient is having progression of her breast cancer despite being on therapy.  Further imaging has been ordered to evaluate.  Currently awaiting the CT imaging of chest and abdomen/pelvis.  Did personally review CT of head noting interval development of sclerotic lesions in calvarium showing progression of disease. Reviewed recent CMP noting BUN 22, creatinine 0.69, and so GFR >60.  Patient also noted to have albumin  of 2.8. Patient does have ACP documentation on file naming her HCPOA as her husband Carlin Kerns, primary alternate is son Carlin Pride, and secondary alternant is Afton Lavalle.  Presented to bedside to meet with patient.  Patient laying comfortably in bed.  Patient's husband, Riva, present at bedside.  Able to introduce myself as a member of the palliative medicine team my role in patient's medical journey.  Spent time learning about patient's medical care up into this point.  Husband describes that patient does have difficulties with memory, particularly short-term memory.  Most  history was obtained from husband secondary to this.  Husband describes that patient has been deteriorating over past few weeks.  Patient's mobility status has greatly deteriorated.  Patient has become weaker and has been eating less.  Patient also now having pain in right hip.  Husband describes how patient previously received radiation for hip pain though now knows that is reoccurring.  Husband knows awaiting further imaging to determine extent of metastatic disease progression.  Once imaging has been obtained, can discuss with oncology what/if cancer directed therapies are appropriate.  Inquired about symptom management at this time.  Husband noted that patient has been started on tramadol  as per oncology.  Husband noted that patient received tramadol  at home and usually has relief with taking 1-2 tabs daily.  Did express concern with patient's reported history of seizures about use of tramadol .  Husband agreeing with continuing tramadol  at this time.  Can change in future if needed.  Deferred symptom management at this time to oncology.  With permission, was able to address patient's CODE STATUS.  Spent time explaining full code versus DNR/DNI.  Expressed concern that if patient were to worsen and her heart were to stop or she would stop breathing, interventions such as cardiac resuscitation and intubation with mechanical ventilation would not lead to quality of life outcomes.  Husband agreed with this.  Noted would appropriately change CODE STATUS to DNR/DNI at this time.  Husband agreeing with this as well.  Will continue other appropriate medical interventions currently.  Awaiting CT results to determine next cancer directed steps.  Spent time providing emotional support via active listening.  All questions answered at that time as able.  Noted palliative medicine team will  continue following patient's medical journey.  Discussed care with RN, hospitalist, and oncologist coordinate care.  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 Dx 04/2015 at time of stroke. b. DCCV 06/2015 - did not hold. Amio started then stopped due to QT prolongation.   Chronic diastolic heart failure (HCC) 06/17/2015   CKD (chronic kidney disease), stage III (HCC)    Cough variant asthma    Dementia (HCC)    Diverticulosis    Essential hypertension    GERD (gastroesophageal reflux disease)    Glaucoma    Hiatal hernia    Hyperlipidemia    Hypothyroidism    Internal hemorrhoids    Laryngospasm    Mitral regurgitation    mild to moderate by echo 12/2019 and moderate by TEE 12/2020   Multinodular goiter    Osteopenia    Postmenopausal    Pulmonary HTN (HCC)     Moderate with PASP by echo 2018 likely Group 2 from pulmonary venous HTN from CHF>>normal PAP by echo 12/2019   Radiation 10/22/14-11/23/14   Left Breast   Stricture and stenosis of cervix    Stroke (HCC)    TIAs   Social History   Socioeconomic History   Marital status: Married    Spouse name: Carlin   Number of children: 2   Years of education: Not on file   Highest education level: 12th grade  Occupational History   Occupation: Retired  Tobacco Use   Smoking status: Former    Current packs/day: 0.00    Average packs/day: 0.8 packs/day for 5.0 years (3.8 ttl pk-yrs)    Types: Cigarettes    Start date: 06/14/1961    Quit date: 06/15/1966    Years since quitting: 57.2    Passive exposure: Past   Smokeless tobacco: Never  Vaping Use   Vaping status: Never Used  Substance and Sexual Activity   Alcohol use: Not Currently    Comment: occasional wine   Drug use: No   Sexual activity: Not Currently    Birth control/protection: Post-menopausal  Other Topics Concern   Not on file  Social  History Narrative   Retired. Lives with husband.    Right Handed   Drinks 1-2 cups caffeine daily   Social Drivers of Health   Financial Resource Strain: Low Risk  (07/30/2023)   Overall Financial Resource Strain (CARDIA)    Difficulty of Paying Living Expenses: Not hard at all  Food Insecurity: No Food Insecurity (08/21/2023)   Hunger Vital Sign    Worried About Running Out of Food in the Last Year: Never true    Ran Out of Food in the Last Year: Never true  Transportation Needs: No Transportation Needs (08/21/2023)   PRAPARE - Administrator, Civil Service (Medical): No    Lack of Transportation (Non-Medical): No  Physical Activity: Inactive (07/30/2023)   Exercise Vital Sign    Days of Exercise per Week: 0 days    Minutes of Exercise per Session: 40 min  Stress: No Stress Concern Present (07/30/2023)   [employer] of Occupational Health - Occupational Stress Questionnaire    Feeling of Stress : Only a little  Social Connections: Moderately  Integrated (08/21/2023)   Social Connection and Isolation Panel    Frequency of Communication with Friends and Family: Once a week    Frequency of Social Gatherings with Friends and Family: Once a week    Attends Religious Services: More than 4 times per year    Active Member of Clubs or Organizations: No    Attends Engineer, structural: More than 4 times per year    Marital Status: Married   Family History  Problem Relation Age of Onset   Stroke Mother    Hypertension Mother    Hyperlipidemia Mother    Stroke Father    Transient ischemic attack Father    Hyperlipidemia Father    Hypertension Father    Atrial fibrillation Son    Heart attack Neg Hx    Scheduled Meds:  atorvastatin   40 mg Oral QPM   B-complex with vitamin C  1 tablet Oral Daily   calcium -vitamin D   1 tablet Oral Q breakfast   cholecalciferol   2,000 Units Oral Daily   digoxin   0.125 mg Oral Daily   donepezil   5 mg Oral QHS   dronabinol   5 mg  Oral BID AC   feeding supplement  237 mL Oral TID BM   fluticasone   1 spray Each Nare BID   fluticasone  furoate-vilanterol  1 puff Inhalation Daily   folic acid   500 mcg Oral Daily   furosemide   20 mg Oral BID   gabapentin   400 mg Oral BID   latanoprost   1 drop Both Eyes BID   levETIRAcetam   500 mg Oral QHS   loratadine   10 mg Oral Daily   megestrol   400 mg Oral BID   memantine   10 mg Oral BID   metoprolol  tartrate  50 mg Oral BID   multivitamins with iron   1 tablet Oral Daily   pantoprazole   40 mg Oral Daily   potassium chloride  SA  20 mEq Oral Daily   thyroid   90 mg Oral Daily   Continuous Infusions:  cefTRIAXone  (ROCEPHIN )  IV Stopped (08/22/23 0721)   PRN Meds:.acetaminophen  **OR** acetaminophen , ondansetron  **OR** ondansetron  (ZOFRAN ) IV, senna-docusate, traMADol  Allergies  Allergen Reactions   Combigan [Brimonidine Tartrate-Timolol ] Itching    Per patient made eyes red, sore, and sensitivity to light   Sulfa Antibiotics Hives and Rash   Sulfamethoxazole-Trimethoprim Hives   CBC:    Component Value Date/Time   WBC 4.3 08/21/2023 1552   HGB 12.1 08/21/2023 1552   HGB 12.0 08/13/2023 0905   HGB 10.9 (L) 12/24/2022 0925   HGB 15.1 09/22/2016 1459   HCT 36.1 08/21/2023 1552   HCT 34.3 12/24/2022 0925   HCT 44.7 09/22/2016 1459   PLT 51 (L) 08/21/2023 1552   PLT 124 (L) 08/13/2023 0905   PLT 285 12/24/2022 0925   MCV 99.7 08/21/2023 1552   MCV 104 (H) 12/24/2022 0925   MCV 93.6 09/22/2016 1459   NEUTROABS 3.5 08/21/2023 0031   NEUTROABS 10.0 (H) 12/24/2022 0925   NEUTROABS 5.5 09/22/2016 1459   LYMPHSABS 0.2 (L) 08/21/2023 0031   LYMPHSABS 0.3 (L) 12/24/2022 0925   LYMPHSABS 2.3 09/22/2016 1459   MONOABS 0.4 08/21/2023 0031   MONOABS 0.7 09/22/2016 1459   EOSABS 0.0 08/21/2023 0031   EOSABS 0.0 12/24/2022 0925   BASOSABS 0.0 08/21/2023 0031   BASOSABS 0.0 12/24/2022 0925   BASOSABS 0.1 09/22/2016 1459   Comprehensive Metabolic Panel:    Component Value  Date/Time   NA 136 08/20/2023 2328  NA 141 08/05/2023 1018   NA 141 09/22/2016 1459   K 3.7 08/20/2023 2328   K 3.8 09/22/2016 1459   CL 104 08/20/2023 2328   CO2 24 08/20/2023 2328   CO2 26 09/22/2016 1459   BUN 25 (H) 08/20/2023 2328   BUN 16 08/05/2023 1018   BUN 19.8 09/22/2016 1459   CREATININE 0.78 08/20/2023 2328   CREATININE 0.89 08/13/2023 0905   CREATININE 1.1 09/22/2016 1459   GLUCOSE 121 (H) 08/20/2023 2328   GLUCOSE 116 09/22/2016 1459   GLUCOSE 88 01/13/2006 1012   CALCIUM  8.2 (L) 08/20/2023 2328   CALCIUM  9.2 09/22/2016 1459   AST 50 (H) 08/20/2023 2328   AST 49 (H) 08/13/2023 0905   AST 15 09/22/2016 1459   ALT 46 (H) 08/20/2023 2328   ALT 32 08/13/2023 0905   ALT 21 09/22/2016 1459   ALKPHOS 68 08/20/2023 2328   ALKPHOS 98 09/22/2016 1459   BILITOT 0.9 08/20/2023 2328   BILITOT 0.9 08/13/2023 0905   BILITOT 0.65 09/22/2016 1459   PROT 5.6 (L) 08/20/2023 2328   PROT 5.5 (L) 08/05/2023 1018   PROT 7.1 09/22/2016 1459   ALBUMIN  2.8 (L) 08/20/2023 2328   ALBUMIN  3.3 (L) 08/05/2023 1018   ALBUMIN  3.8 09/22/2016 1459    Physical Exam: Vital Signs: BP 110/71 (BP Location: Left Arm)   Pulse 87   Temp (!) 100.4 F (38 C) (Oral)   Resp 16   Ht 5' (1.524 m)   Wt 54.6 kg   SpO2 94%   BMI 23.51 kg/m  SpO2: SpO2: 94 % O2 Device: O2 Device: Room Air O2 Flow Rate:   Intake/output summary:  Intake/Output Summary (Last 24 hours) at 08/22/2023 0849 Last data filed at 08/22/2023 9156 Gross per 24 hour  Intake 1579.8 ml  Output 200 ml  Net 1379.8 ml   LBM: Last BM Date : 08/20/23 Baseline Weight: Weight: 54.6 kg Most recent weight: Weight: 54.6 kg  General: NAD, awake, confused at times, unable to participate in complex medical decision-making, cachectic, frail Cardiovascular: RRR, no edema in LE b/l Respiratory: no increased work of breathing noted, not in respiratory distress Abdomen: not distended Extremities: Muscle wasting present in all  extremities Neuro: Awake, confused, not able to participate in complex medical decision making Psych: Calm          Palliative Performance Scale: 40%              Additional Data Reviewed: Recent Labs    08/20/23 2328 08/21/23 0031 08/21/23 1552  WBC  --  4.2 4.3  HGB  --  9.9* 12.1  PLT  --  57* 51*  NA 136  --   --   BUN 25*  --   --   CREATININE 0.78  --   --     Imaging: CT Head Wo Contrast CLINICAL DATA:  Mental status change, unknown cause  EXAM: CT HEAD WITHOUT CONTRAST  TECHNIQUE: Contiguous axial images were obtained from the base of the skull through the vertex without intravenous contrast.  RADIATION DOSE REDUCTION: This exam was performed according to the departmental dose-optimization program which includes automated exposure control, adjustment of the mA and/or kV according to patient size and/or use of iterative reconstruction technique.  COMPARISON:  04/29/2015  FINDINGS: Brain: Normal anatomic configuration. Progressive parenchymal volume loss is present, with global parenchymal volume commensurate with the patient's age. Mild periventricular white matter changes are present likely reflecting the sequela of small vessel ischemia. No  abnormal intra or extra-axial mass lesion or fluid collection. No abnormal mass effect or midline shift. No evidence of acute intracranial hemorrhage or infarct. Ventricular size is normal. Cerebellum unremarkable.  Vascular: No asymmetric hyperdense vasculature at the skull base.  Skull: No acute fracture. Interval development of multiple sclerotic foci within the calvarium compatible with sclerotic metastatic disease.  Sinuses/Orbits: Paranasal sinuses are clear. Ocular lenses have been removed. Orbits are otherwise unremarkable.  Other: Mastoid air cells and middle ear cavities are clear.  IMPRESSION: 1. No evidence of acute intracranial abnormality. 2. Progressive global parenchymal volume loss. 3.  Interval development of multiple sclerotic foci within the calvarium compatible with sclerotic metastatic disease.  Electronically Signed   By: Dorethia Molt M.D.   On: 08/20/2023 23:48 DG Chest Port 1 View CLINICAL DATA:  Generalized weakness, breast cancer  EXAM: PORTABLE CHEST 1 VIEW  COMPARISON:  05/10/2023  FINDINGS: Moderate left pleural effusion has developed with compressive atelectasis of the left lung base. Right lung is clear. No pneumothorax. No pleural effusion on the right. Right internal jugular chest port tip seen within the superior vena cava. Cardiac size is stable. Pulmonary vascularity is normal. Surgical clips are seen within the left axilla.  IMPRESSION: 1. Interval development of moderate left pleural effusion.  Electronically Signed   By: Dorethia Molt M.D.   On: 08/20/2023 23:30    I personally reviewed recent imaging.   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  Oncology consulted for recommendations.  Palliative medicine team consulted to assist with complex medical decision making.  # Complex medical decision making/goals of care  - Discussed care with husband while patient laying comfortably in bed as detailed above in HPI.  Patient has dementia and so has difficulties participating in complex medical decision making.  Husband notes particularly difficult with short-term memory loss.  Discussed medical care at this time.  Awaiting further imaging to determine extent of progression of patient's metastatic breast cancer.  Oncology to determine if  patient is appropriate for further cancer directed therapies or if would be appropriate to focus on patient's comfort at this time due to her debilitated state.  Palliative medicine team will continue to follow along to engage in conversations as able and appropriate.  -  Code Status: Limited: Do not attempt resuscitation (DNR) -DNR-LIMITED -Do Not Intubate/DNI     - Discussed CODE STATUS with patient husband as detailed above in HPI.  Explained full code versus DNR/DNI.  Expressed concern that if patient were to be sick enough that her heart were to stop or she would stop breathing, interventions such as cardiac resuscitation and intubation with mechanical ventilation would not lead to good quality of life outcomes for patient with her underlying metastatic breast cancer.  Husband agreeing with change of CODE STATUS to DNR/DNI at this time.  # Symptom management Oncologist has been managing patient's symptom medications.  Will defer to oncology regarding this.  Will express appropriateness of monitoring patient's with reported history of seizures if receiving tramadol  as tramadol  can lower seizure threshold.  Also appropriate to monitor since patient receiving Megace  which can cause adrenal insufficiency and hypercoagulability which is  concerning in a patient with cancer.  # Psycho-social/Spiritual Support:  - Support System: Husband, son, daughter  # Discharge Planning:  To Be Determined  Thank you for allowing the palliative care team to participate in the care Ronal Primrose.  Tinnie Radar, DO Palliative Care Provider PMT # [phone]  If patient remains symptomatic despite maximum doses, please call PMT at [phone] between 0700 and 1900. Outside of these hours, please call attending, as PMT does not have night coverage.  Billing based on MDM: High  Problems Addressed: One or more chronic illnesses with severe exacerbation, progression, or side effects of treatment.  Amount and/or  Complexity of Data: Category 1:Review of prior external note(s) from each unique source, Review of the result(s) of each unique test, and Assessment requiring an independent historian(s), Category 2:Independent interpretation of a test performed by another physician/other qualified health care professional (not separately reported), and Category 3:Discussion of management or test interpretation with external physician/other qualified health care professional/appropriate source (not separately reported)  Risks: Decision not to resuscitate or to de-escalate care because of poor prognosis

## 2023-08-22 NOTE — Progress Notes (Signed)
 [facility]                                                                              Patricia [name], is a 83 y.o. female, DOB - 01-05-1941, FMW:[id number] Admit date - 08/20/2023    Outpatient Primary MD for the patient is Gayle Saddie FALCON, PA-C  LOS - 0  days  No chief complaint on file.      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  Per spouse, patient has had poor appetite and weight loss over the last few weeks.  She was seen by her oncologist 2 weeks ago and was started on Marinol  and Megace  to help with appetite.  Over the last few days, patient had been significantly weak and had difficulty getting in bed.  In ED, BP soft in 90s, hemoglobin 9.9, (12.0 on 08/13/2023), platelets 57 (124 on 08/13/2023)  UA positive for UTI  Assessment & Plan    Principal Problem:   Generalized weakness, FTT - In the setting of metastatic breast CA, stage IV presenting with generalized weakness, poor p.o. intake, worsening pancytopenia, UTI -Transfuse 1 unit packed RBCs -Hemoglobin stable today -Continue Marinol , Megace , fall precautions - Oncology following, seen by Dr. Timmy, palliative medicine consulted for GOC.  Right hip pain in the setting of metastatic breast CA, metastasis -Obtain right hip x-ray, oncology/Dr. Timmy has also ordered CT chest abdomen pelvis for further workup   Pancytopenia with anemia, thrombocytopenia -Hemoglobin 9.9 on admission, status post 1 unit packed RBCs -Hemoglobin stable at 12.1, at baseline -PLTs trending down, 51K, no active bleeding or bruising   Metastatic breast cancer, Osseous metastases - Management per oncology,  - Follow CT chest abdomen pelvis, per oncology, likely has  progression of the disease  - Continue gabapentin , Megace , Marinol , as needed tramadol   - PT OT consulted, palliative medicine consulted for GOC   UTI, acute cystitis - immunocompromised state, - Continue IV Rocephin , follow urine culture and sensitivities -Follow blood cultures     Chronic pleural effusion - CXR on admission shows moderate left pleural effusion - Secondary to her breast malignancy -  will obtain thoracentesis if develops any hypoxia   Atrial fibrillation - HR stable, continue Lopressor  - Hold Eliquis  in the setting of thrombocytopenia    Chronic diastolic HF -  Continue on digoxin , Lasix  and Lopressor  - Continue K supplementation -2D echo 03/26/2023, EF 60 to 65%, diastolic function could not be evaluated, moderate MR, moderate TR.  Hypophosphatemia -Placed on Neutra-Phos x 1 week, follow labs    HLD - Continue atorvastatin    Hypothyroidism - Continue Armour - TSH 5.9   Seizure disorder - Continue Keppra    GERD - Continue Protonix    Dementia - Continue Aricept  and memantine    Asthma - No wheezing, continue home bronchodilators   Estimated body mass index is 23.29 kg/m as calculated from the following:   Height as of this encounter: 5' (  1.524 m).   Weight as of this encounter: 54.1 kg.  Code Status: Full code DVT Prophylaxis:  SCDs Start: 08/21/23 0809   Level of Care: Level of care: Med-Surg Family Communication: Updated patient Disposition Plan:      Remains inpatient appropriate:     Procedures:    Consultants:   Oncology Palliative medicine  Antimicrobials:   Anti-infectives (From admission, onward)    Start     Dose/Rate Route Frequency Ordered Stop   08/21/23 2000  cefTRIAXone  (ROCEPHIN ) 1 g in sodium chloride  0.9 % 100 mL IVPB        1 g 200 mL/hr over 30 Minutes Intravenous Every 24 hours 08/21/23 1821            Medications  atorvastatin   40 mg Oral QPM   B-complex with vitamin C  1 tablet Oral Daily    calcium -vitamin D   1 tablet Oral Q breakfast   cholecalciferol   2,000 Units Oral Daily   digoxin   0.125 mg Oral Daily   donepezil   5 mg Oral QHS   dronabinol   5 mg Oral BID AC   feeding supplement  237 mL Oral TID BM   fluticasone   1 spray Each Nare BID   fluticasone  furoate-vilanterol  1 puff Inhalation Daily   folic acid   500 mcg Oral Daily   furosemide   20 mg Oral BID   gabapentin   400 mg Oral BID   latanoprost   1 drop Both Eyes BID   levETIRAcetam   500 mg Oral QHS   loratadine   10 mg Oral Daily   megestrol   400 mg Oral BID   memantine   10 mg Oral BID   metoprolol  tartrate  50 mg Oral BID   multivitamins with iron   1 tablet Oral Daily   pantoprazole   40 mg Oral Daily   phosphorus  500 mg Oral BID   potassium chloride  SA  20 mEq Oral Daily   thyroid   90 mg Oral Daily      Subjective:   Patricia [name] was seen and examined today.  Occasionally has right hip pain, currently no pain.  No acute nausea vomiting, chest pain or shortness of breath.  Spiked fever, 100.4 F this morning.  Patient denies dizziness, chest pain, shortness of breath, abdominal pain, N/V/D/C.  Objective:   Vitals:   08/22/23 0258 08/22/23 0557 08/22/23 0705 08/22/23 0759  BP: 106/76 110/71    Pulse: 65 87    Resp:  16    Temp:  (!) 100.4 F (38 C)    TempSrc:  Oral    SpO2: (!) 56% 95%  94%  Weight:   54.1 kg   Height:        Intake/Output Summary (Last 24 hours) at 08/22/2023 1010 Last data filed at 08/22/2023 0843 Gross per 24 hour  Intake 1579.8 ml  Output 200 ml  Net 1379.8 ml     Wt Readings from Last 3 Encounters:  08/22/23 54.1 kg  08/13/23 54.4 kg  08/03/23 54.9 kg     Exam General: Alert and oriented x 3, NAD Cardiovascular: S1 S2 auscultated,  RRR Respiratory: CTAB Gastrointestinal: Soft, nontender, nondistended, + bowel sounds Ext: no pedal edema bilaterally Neuro: no new deficits Psych: flat affect    Data Reviewed:  I have personally reviewed following labs     CBC Lab Results  Component Value Date   WBC 4.3 08/21/2023   RBC 3.62 (L) 08/21/2023   HGB 12.1 08/21/2023   HCT 36.1 08/21/2023  MCV 99.7 08/21/2023   MCH 33.4 08/21/2023   PLT 51 (L) 08/21/2023   MCHC 33.5 08/21/2023   RDW 19.9 (H) 08/21/2023   LYMPHSABS 0.2 (L) 08/21/2023   MONOABS 0.4 08/21/2023   EOSABS 0.0 08/21/2023   BASOSABS 0.0 08/21/2023     Last metabolic panel Lab Results  Component Value Date   NA 136 08/20/2023   K 3.7 08/20/2023   CL 104 08/20/2023   CO2 24 08/20/2023   BUN 25 (H) 08/20/2023   CREATININE 0.78 08/20/2023   GLUCOSE 121 (H) 08/20/2023   GFRNONAA >60 08/20/2023   GFRAA 61 09/28/2019   CALCIUM  8.2 (L) 08/20/2023   PHOS 1.1 (L) 08/22/2023   PROT 5.6 (L) 08/20/2023   ALBUMIN  2.8 (L) 08/20/2023   LABGLOB 2.2 08/05/2023   AGRATIO 2.0 10/09/2021   BILITOT 0.9 08/20/2023   ALKPHOS 68 08/20/2023   AST 50 (H) 08/20/2023   ALT 46 (H) 08/20/2023   ANIONGAP 8 08/20/2023    CBG (last 3)  No results for input(s): GLUCAP in the last 72 hours.    Coagulation Profile: No results for input(s): INR, PROTIME in the last 168 hours.   Radiology Studies: I have personally reviewed the imaging studies  CT Head Wo Contrast Result Date: 08/20/2023 CLINICAL DATA:  Mental status change, unknown cause EXAM: CT HEAD WITHOUT CONTRAST TECHNIQUE: Contiguous axial images were obtained from the base of the skull through the vertex without intravenous contrast. RADIATION DOSE REDUCTION: This exam was performed according to the departmental dose-optimization program which includes automated exposure control, adjustment of the mA and/or kV according to patient size and/or use of iterative reconstruction technique. COMPARISON:  04/29/2015 FINDINGS: Brain: Normal anatomic configuration. Progressive parenchymal volume loss is present, with global parenchymal volume commensurate with the patient's age. Mild periventricular white matter changes are present likely  reflecting the sequela of small vessel ischemia. No abnormal intra or extra-axial mass lesion or fluid collection. No abnormal mass effect or midline shift. No evidence of acute intracranial hemorrhage or infarct. Ventricular size is normal. Cerebellum unremarkable. Vascular: No asymmetric hyperdense vasculature at the skull base. Skull: No acute fracture. Interval development of multiple sclerotic foci within the calvarium compatible with sclerotic metastatic disease. Sinuses/Orbits: Paranasal sinuses are clear. Ocular lenses have been removed. Orbits are otherwise unremarkable. Other: Mastoid air cells and middle ear cavities are clear. IMPRESSION: 1. No evidence of acute intracranial abnormality. 2. Progressive global parenchymal volume loss. 3. Interval development of multiple sclerotic foci within the calvarium compatible with sclerotic metastatic disease. Electronically Signed   By: Dorethia Molt M.D.   On: 08/20/2023 23:48   DG Chest Port 1 View Result Date: 08/20/2023 CLINICAL DATA:  Generalized weakness, breast cancer EXAM: PORTABLE CHEST 1 VIEW COMPARISON:  05/10/2023 FINDINGS: Moderate left pleural effusion has developed with compressive atelectasis of the left lung base. Right lung is clear. No pneumothorax. No pleural effusion on the right. Right internal jugular chest port tip seen within the superior vena cava. Cardiac size is stable. Pulmonary vascularity is normal. Surgical clips are seen within the left axilla. IMPRESSION: 1. Interval development of moderate left pleural effusion. Electronically Signed   By: Dorethia Molt M.D.   On: 08/20/2023 23:30       Seidy Labreck M.D. [facility] 08/22/2023, 10:10 AM  Available via Epic secure chat 7am-7pm After 7 pm, please refer to night coverage provider listed on amion.

## 2023-08-22 NOTE — Evaluation (Signed)
 Occupational Therapy Evaluation Patient Details Name: Patricia [name] MRN: [id number] DOB: 08/10/1940 Today's Date: 08/22/2023   History of Present Illness   Patient is a 83 year old female who presented to hospital on 6/20 with R hip pain and weakness. Patient was admitted with R hip pain in setting of metastatic breast cancer, pancytopenia with anemia, UTI, chronic pleural effusion. 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     Clinical Impressions Patient evaluated by Occupational Therapy with no further acute OT needs identified. All education has been completed and the patient has no further questions. Patient has husband support for ADLs at baseline. Patient appears to be at functional baseline at this time.  See below for any follow-up Occupational Therapy or equipment needs. OT is signing off. Thank you for this referral.      If plan is discharge home, recommend the following:   A little help with walking and/or transfers;A little help with bathing/dressing/bathroom;Assistance with cooking/housework;Direct supervision/assist for medications management;Assist for transportation;Help with stairs or ramp for entrance;Direct supervision/assist for financial management;Supervision due to cognitive status     Functional Status Assessment   Patient has not had a recent decline in their functional status     Equipment Recommendations   None recommended by OT      Precautions/Restrictions   Precautions Precautions: Fall Restrictions Weight Bearing Restrictions Per Provider Order: No     Mobility Bed Mobility Overal bed mobility: Needs Assistance Bed Mobility: Supine to Sit, Sit to Supine     Supine to sit: Contact guard Sit to supine: Contact guard assist   General bed mobility comments: with increased time and education for rolling onto side to prevent twisting.              Balance Overall balance assessment:  Mild deficits observed, not formally tested           ADL either performed or assessed with clinical judgement   ADL Overall ADL's : At baseline       General ADL Comments: patient has help from husband for all ADLs. patient's husband said he needed patient to be able to get out of bed and be able to make it into bathroom to be able to go back home. patient was CGA for bed mobility with education to avoid twisting on back. patient was CGA with RW to get to bathroom and back with increased time and cues to lean nose over toes to stand from toilet. patient's husband reported that this is how she typically moved at home and he had to cue her to use RW or she would attempt to furniture walk. paitent was min A for clothing management and max A for hygiene with husband reporting he helps with this at home.  patient will need continued 24/7 caregiver support in next level of care. patient appears to be near functional baseline at this time. mobility referral was placed     Vision   Vision Assessment?: No apparent visual deficits            Pertinent Vitals/Pain Pain Assessment Pain Assessment: Faces Faces Pain Scale: Hurts a little bit Pain Location: all over Pain Descriptors / Indicators: Constant Pain Intervention(s): Limited activity within patient's tolerance, Monitored during session     Extremity/Trunk Assessment Upper Extremity Assessment Upper Extremity Assessment: Generalized weakness       Cervical / Trunk Assessment Cervical / Trunk Assessment: Kyphotic      Cognition  Arousal: Alert Behavior During Therapy: Flat affect Cognition: History of cognitive impairments             OT - Cognition Comments: patients husband very verbouse during session. patient was able to follow one step commands.                 Following commands: Intact                  Home Living Family/patient expects to be discharged to:: Private residence Living Arrangements:  Spouse/significant other Available Help at Discharge: Family;Available 24 hours/day Type of Home: House Home Access: Stairs to enter [employer] of Steps: 5-6 Entrance Stairs-Rails: Right Home Layout: One level     Bathroom Shower/Tub: Producer, television/film/video: Handicapped height     Home Equipment: Agricultural consultant (2 wheels);Cane - single point;Grab bars - tub/shower;Shower seat - built in;Hand held shower head   Additional Comments: has gait belt      Prior Functioning/Environment Prior Level of Function : Needs assist             Mobility Comments: has been using RW since L IM nail per spouse ADLs Comments: spouse assists with ADLs    OT Problem List: Decreased safety awareness;Decreased activity tolerance   OT Treatment/Interventions:        OT Goals(Current goals can be found in the care plan section)   Acute Rehab OT Goals OT Goal Formulation: All assessment and education complete, DC therapy   OT Frequency:          AM-PAC OT 6 Clicks Daily Activity     Outcome Measure Help from another person eating meals?: A Little Help from another person taking care of personal grooming?: A Little Help from another person toileting, which includes using toliet, bedpan, or urinal?: A Lot Help from another person bathing (including washing, rinsing, drying)?: A Lot Help from another person to put on and taking off regular upper body clothing?: A Little Help from another person to put on and taking off regular lower body clothing?: A Lot 6 Click Score: 15   End of Session Equipment Utilized During Treatment: Rolling walker (2 wheels)  Activity Tolerance: Patient limited by fatigue Patient left: in bed;with call bell/phone within reach;with bed alarm set;with family/visitor present  OT Visit Diagnosis: Unsteadiness on feet (R26.81);Other abnormalities of gait and mobility (R26.89);History of falling (Z91.81)                Time: 8850-8790 OT  Time Calculation (min): 20 min Charges:  OT General Charges $OT Visit: 1 Visit OT Evaluation $OT Eval Low Complexity: 1 Low  Deetya Drouillard OTR/L, MS Acute Rehabilitation Department Office# [phone]   Geofm CHRISTELLA Dance 08/22/2023, 12:26 PM

## 2023-08-22 NOTE — Consult Note (Signed)
 [name] is well-known to me.  She is a very charming 83 year old white female.  She has metastatic breast cancer.  She has equivocal HER2 status.  We have had her on Enhertu  for quite a while.  We started this a year ago.  Her last echocardiogram was done back in January which showed a decent ejection fraction.  Unfortunately, I suspect that her cancer is progressing.  Her last CA 27.29 was I think 64.  She was admitted with weakness on 08/20/2023.  I think she may have been transfused.  When she came in, her white cell count was 4.2.  Hemoglobin 9.9.  Platelet count 57,000.  Her sodium 136.  Potassium 3.7.  BUN 25 creatinine 0.78.  Calcium  8.2 with an albumin  of 2.8.  Her last LDH was quite high at 451.  She had slightly elevated LFTs.  On a chest x-ray when she came in, she had a moderate left pleural effusion.  She has had this before.  She has had this drained without any malignant cells.  Again, I suspect that we probably are looking to cancer progression.  Again I am not sure while she is eating.  She just looks somewhat frail to me.  She does have some hearing difficulties.  She has had no obvious bleeding.  There is been no obvious change in bowel or bladder habits.  Unfortunately, her husband is not with her who can really give a lot of the history.  She has had no fever.  There has been no pain issues.  On occasion, she has some discomfort in the right hip.  She has had no leg swelling.   Her vital signs are temperature 100.4.  Pulse 87.  Blood pressure 110/71.  Her head and exam shows no ocular or oral lesions.  She has no mucositis.  There is no scleral icterus.  There is no adenopathy in the neck.  Lungs are clear bilaterally.  She has decent air movement bilaterally.  Cardiac exam regular rate and rhythm.  Abdomen is soft.  There is no fluid wave.  There is no palpable liver or spleen tip.  Extremity shows some trace edema in the legs.  Neurological exam shows no obvious  neurological deficits.  Again, I have to suspect that we are looking at progressive disease.  We also might be looking at an infection.  She is somewhat febrile.  I would make sure that cultures are checked.  Again I will get a CT of her body.  I know that she is due for a PET scan but while she is in the hospital, I think a CT scan would help with.  Her LFTs were slightly elevated.  LDH was elevated.  I do worry that she may have liver metastasis.  We may have to think about a left thoracentesis.  Again I would not think that the fluid would be infected.  However, this is always a possibility.  Again I am not sure what she is eating.  She is on Megace .  I believe that she probably will be in the hospital for a few more days until we get a better handle on her weakness.  Again she really needs to have some physical therapy if she can do this.  I know that she will get incredible care for everybody up on 6 E.     Jeralyn Crease, MD  Inge 30:17

## 2023-08-22 NOTE — Plan of Care (Signed)

## 2023-08-22 NOTE — Progress Notes (Signed)
 Initial Nutrition Assessment  INTERVENTION:   -Ensure Plus High Protein po TID, each supplement provides 350 kcal and 20 grams of protein.   -Continue vitamin supplementation  NUTRITION DIAGNOSIS:   Increased nutrient needs related to cancer and cancer related treatments as evidenced by estimated needs.  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  Patient currently consuming 25% of meals.  Per chart review, pt with dementia and poor short term memory. Will attempt to gather history at follow-up in person. Ensure has been ordered as well as vitamin supplements, accepting at this time.   Admission weight: 120 lbs Per weight records, pt has lost 27 lbs since 08/24/22 (18% wt loss x 1 year, insignificant for time frame). However, weight has been trending down over the past year.   Medications: B complex w/ Vitamin C, OSCAL-D, Vitamin D , Marinol , Folic acid , Lasix , Megace , Multivitamin with minerals daily, K-Phos, KLOR-CON ,   Labs reviewed: Low Phos   NUTRITION - FOCUSED PHYSICAL EXAM:  Unable to complete, working remotely  Diet Order:   Diet Order             Diet regular Room service appropriate? Yes; Fluid consistency: Thin  Diet effective now                   EDUCATION NEEDS:   Not appropriate for education at this time  Skin:  Skin Assessment: Reviewed RN Assessment  Last BM:  6/20 -type 7  Height:   Ht Readings from Last 1 Encounters:  08/21/23 5' (1.524 m)    Weight:   Wt Readings from Last 1 Encounters:   08/22/23 54.1 kg    BMI:  Body mass index is 23.29 kg/m.  Estimated Nutritional Needs:   Kcal:  1650-1850  Protein:  80-95g  Fluid:  1.8L/day  Morna Lee, MS, RD, LDN Inpatient Clinical Dietitian Contact via Secure chat

## 2023-08-22 NOTE — Evaluation (Signed)
 Physical Therapy Evaluation Patient Details Name: Patricia [name] MRN: [id number] DOB: Apr 14, 1940 Today's Date: 08/22/2023  History of Present Illness  Patient is a 83 year old female who presented to hospital on 6/20 with R hip pain and weakness. Patient was admitted with R hip pain in setting of metastatic breast cancer, pancytopenia with anemia, UTI, chronic pleural effusion. 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  Clinical Impression  Pt admitted with above diagnosis.  Pt currently with functional limitations due to the deficits listed below (see PT Problem List). Pt will benefit from acute skilled PT to increase their independence and safety with mobility to allow discharge.  Xray leaving pt's room and pt requesting assist to bathroom.  Pt assisted with ambulating to bathroom and changing her depends from home (diarrhea present).  Pt then washed her hands and returned to bed as lab arrived.  Spouse present and reports he assists pt at home as needed.  Pt has been able to ambulate around home with [facility] however more recently requiring RW.  Pt typically not leaving home unless going to MD appointments and occasionally out to dinner but spouse reports pt mostly homebound.  He prefers pt return home upon d/c.           If plan is discharge home, recommend the following: A little help with walking and/or transfers;A little help with bathing/dressing/bathroom;Assistance with cooking/housework;Assist for transportation;Help with stairs or ramp for entrance   Can travel by private vehicle        Equipment Recommendations None recommended by PT  Recommendations for Other Services       Functional Status Assessment Patient has had a recent decline in their functional status and demonstrates the ability to make significant improvements in function in a reasonable and predictable amount of time.     Precautions / Restrictions  Precautions Precautions: Fall      Mobility  Bed Mobility Overal bed mobility: Needs Assistance Bed Mobility: Sit to Supine, Rolling, Sidelying to Sit Rolling: Contact guard assist Sidelying to sit: Contact guard assist   Sit to supine: Min assist   General bed mobility comments: cues for rolling; light assist for LEs onto bed due to fatigue    Transfers Overall transfer level: Needs assistance Equipment used: Rolling walker (2 wheels) Transfers: Sit to/from Stand Sit to Stand: Contact guard assist           General transfer comment: verbal cues for hand placement    Ambulation/Gait Ambulation/Gait assistance: Contact guard assist Gait Distance (Feet): 8 Feet (x2) Assistive device: Rolling walker (2 wheels) Gait Pattern/deviations: Step-through pattern, Decreased stride length, Trunk flexed Gait velocity: decr     General Gait Details: cues for use of RW/maneuvering; ambulated to/from bathroom per request; no significant change in pain reported  Stairs            Wheelchair Mobility     Tilt Bed    Modified Rankin (Stroke Patients Only)       Balance Overall balance assessment: Needs assistance Sitting-balance support: No upper extremity supported, Feet supported Sitting balance-Leahy Scale: Good     Standing balance support: No upper extremity supported, During functional activity Standing balance-Leahy Scale: Good Standing balance comment: able to stand at sink and wash/dry hands without UE support                             Pertinent Vitals/Pain  Pain Assessment Pain Assessment: Faces Faces Pain Scale: Hurts a little bit Pain Location: all over Pain Descriptors / Indicators: Constant Pain Intervention(s): Repositioned, Monitored during session    Home Living Family/patient expects to be discharged to:: Private residence Living Arrangements: Spouse/significant other Available Help at Discharge: Family;Available 24  hours/day Type of Home: House Home Access: Stairs to enter Entrance Stairs-Rails: Right Entrance Stairs-Number of Steps: 5-6   Home Layout: One level Home Equipment: Agricultural consultant (2 wheels);Cane - single point;Grab bars - tub/shower;Shower seat - built in;Hand held shower head Additional Comments: has gait belt    Prior Function Prior Level of Function : Needs assist             Mobility Comments: has been using RW since L IM nail per spouse ADLs Comments: spouse assists with ADLs     Extremity/Trunk Assessment   Upper Extremity Assessment Upper Extremity Assessment: Generalized weakness    Lower Extremity Assessment Lower Extremity Assessment: Generalized weakness    Cervical / Trunk Assessment Cervical / Trunk Assessment: Kyphotic  Communication   Communication Communication: No apparent difficulties    Cognition Arousal: Alert Behavior During Therapy: Flat affect   PT - Cognitive impairments: Problem solving, Safety/Judgement                         Following commands: Intact       Cueing       General Comments      Exercises     Assessment/Plan    PT Assessment Patient needs continued PT services  PT Problem List Decreased mobility;Decreased balance;Decreased activity tolerance;Decreased strength;Pain;Decreased knowledge of use of DME       PT Treatment Interventions DME instruction;Gait training;Balance training;Functional mobility training;Therapeutic activities;Therapeutic exercise;Patient/family education    PT Goals (Current goals can be found in the Care Plan section)  Acute Rehab PT Goals PT Goal Formulation: With patient/family Time For Goal Achievement: 09/05/23 Potential to Achieve Goals: Good    Frequency Min 3X/week     Co-evaluation               AM-PAC PT 6 Clicks Mobility  Outcome Measure Help needed turning from your back to your side while in a flat bed without using bedrails?: A Little Help  needed moving from lying on your back to sitting on the side of a flat bed without using bedrails?: A Little Help needed moving to and from a bed to a chair (including a wheelchair)?: A Little Help needed standing up from a chair using your arms (e.g., wheelchair or bedside chair)?: A Little Help needed to walk in hospital room?: A Little Help needed climbing 3-5 steps with a railing? : A Lot 6 Click Score: 17    End of Session   Activity Tolerance: Patient limited by fatigue Patient left: with call bell/phone within reach;in bed;with family/visitor present Nurse Communication: Mobility status PT Visit Diagnosis: Difficulty in walking, not elsewhere classified (R26.2);Muscle weakness (generalized) (M62.81)    Time: 8989-8971 PT Time Calculation (min) (ACUTE ONLY): 18 min   Charges:   PT Evaluation $PT Eval Low Complexity: 1 Low   PT General Charges $$ ACUTE PT VISIT: 1 Visit        Tari PT, DPT Physical Therapist Acute Rehabilitation Services Office: [phone]   Tari CROME Payson 08/22/2023, 12:39 PM

## 2023-08-22 NOTE — Care Management Obs Status (Signed)
 MEDICARE OBSERVATION STATUS NOTIFICATION   Patient Details  Name: Patricia [name] MRN: [id number] Date of Birth: 06/19/1940   Medicare Observation Status Notification Given:  Yes    Jon ONEIDA Anon, RN 08/22/2023, 10:00 AM

## 2023-08-23 ENCOUNTER — Inpatient Hospital Stay (HOSPITAL_COMMUNITY)

## 2023-08-23 ENCOUNTER — Other Ambulatory Visit: Payer: Self-pay | Admitting: Family Medicine

## 2023-08-23 ENCOUNTER — Other Ambulatory Visit (HOSPITAL_COMMUNITY)

## 2023-08-23 ENCOUNTER — Other Ambulatory Visit: Payer: Self-pay | Admitting: Neurology

## 2023-08-23 DIAGNOSIS — R531 Weakness: Secondary | ICD-10-CM | POA: Diagnosis not present

## 2023-08-23 DIAGNOSIS — T451X5A Adverse effect of antineoplastic and immunosuppressive drugs, initial encounter: Secondary | ICD-10-CM | POA: Diagnosis not present

## 2023-08-23 DIAGNOSIS — G3184 Mild cognitive impairment, so stated: Secondary | ICD-10-CM

## 2023-08-23 DIAGNOSIS — E032 Hypothyroidism due to medicaments and other exogenous substances: Secondary | ICD-10-CM

## 2023-08-23 DIAGNOSIS — D61818 Other pancytopenia: Secondary | ICD-10-CM | POA: Diagnosis not present

## 2023-08-23 DIAGNOSIS — C799 Secondary malignant neoplasm of unspecified site: Secondary | ICD-10-CM | POA: Diagnosis not present

## 2023-08-23 DIAGNOSIS — R0609 Other forms of dyspnea: Secondary | ICD-10-CM

## 2023-08-23 DIAGNOSIS — D6481 Anemia due to antineoplastic chemotherapy: Secondary | ICD-10-CM | POA: Diagnosis not present

## 2023-08-23 DIAGNOSIS — Z515 Encounter for palliative care: Secondary | ICD-10-CM | POA: Diagnosis not present

## 2023-08-23 DIAGNOSIS — Z7189 Other specified counseling: Secondary | ICD-10-CM | POA: Diagnosis not present

## 2023-08-23 LAB — ECHOCARDIOGRAM COMPLETE
AR max vel: 2 cm2
AV Area VTI: 2.12 cm2
AV Area mean vel: 1.82 cm2
AV Mean grad: 5 mmHg
AV Peak grad: 9 mmHg
Ao pk vel: 1.5 m/s
Area-P 1/2: 4.63 cm2
Height: 60 in
MV M vel: 4.75 m/s
MV Peak grad: 90.3 mmHg
P 1/2 time: 376 ms
S' Lateral: 2.4 cm
Weight: 1908.3 [oz_av]

## 2023-08-23 LAB — COMPREHENSIVE METABOLIC PANEL WITH GFR
ALT: 45 U/L — ABNORMAL HIGH (ref 0–44)
AST: 54 U/L — ABNORMAL HIGH (ref 15–41)
Albumin: 2.6 g/dL — ABNORMAL LOW (ref 3.5–5.0)
Alkaline Phosphatase: 63 U/L (ref 38–126)
Anion gap: 10 (ref 5–15)
BUN: 16 mg/dL (ref 8–23)
CO2: 19 mmol/L — ABNORMAL LOW (ref 22–32)
Calcium: 7.5 mg/dL — ABNORMAL LOW (ref 8.9–10.3)
Chloride: 108 mmol/L (ref 98–111)
Creatinine, Ser: 0.59 mg/dL (ref 0.44–1.00)
GFR, Estimated: >60 mL/min (ref >60)
Glucose, Bld: 78 mg/dL (ref 70–99)
Potassium: 3.4 mmol/L — ABNORMAL LOW (ref 3.5–5.1)
Sodium: 137 mmol/L (ref 135–145)
Total Bilirubin: 0.9 mg/dL (ref 0.0–1.2)
Total Protein: 5.2 g/dL — ABNORMAL LOW (ref 6.5–8.1)

## 2023-08-23 LAB — C DIFFICILE QUICK SCREEN W PCR REFLEX
C Diff antigen: NEGATIVE
C Diff interpretation: NOT DETECTED
C Diff toxin: NEGATIVE

## 2023-08-23 LAB — CBC WITH DIFFERENTIAL/PLATELET
Abs Immature Granulocytes: 0.05 10*3/uL (ref 0.00–0.07)
Basophils Absolute: 0 10*3/uL (ref 0.0–0.1)
Basophils Relative: 0 %
Eosinophils Absolute: 0 10*3/uL (ref 0.0–0.5)
Eosinophils Relative: 0 %
HCT: 35.8 % — ABNORMAL LOW (ref 36.0–46.0)
Hemoglobin: 12.2 g/dL (ref 12.0–15.0)
Immature Granulocytes: 1 %
Lymphocytes Relative: 5 %
Lymphs Abs: 0.2 10*3/uL — ABNORMAL LOW (ref 0.7–4.0)
MCH: 33.5 pg (ref 26.0–34.0)
MCHC: 34.1 g/dL (ref 30.0–36.0)
MCV: 98.4 fL (ref 80.0–100.0)
Monocytes Absolute: 0.4 10*3/uL (ref 0.1–1.0)
Monocytes Relative: 8 %
Neutro Abs: 3.9 10*3/uL (ref 1.7–7.7)
Neutrophils Relative %: 86 %
Platelets: 60 10*3/uL — ABNORMAL LOW (ref 150–400)
RBC: 3.64 MIL/uL — ABNORMAL LOW (ref 3.87–5.11)
RDW: 19.6 % — ABNORMAL HIGH (ref 11.5–15.5)
WBC: 4.5 10*3/uL (ref 4.0–10.5)
nRBC: 0 % (ref 0.0–0.2)

## 2023-08-23 LAB — GASTROINTESTINAL PANEL BY PCR, STOOL (REPLACES STOOL CULTURE)

## 2023-08-23 LAB — TYPE AND SCREEN
ABO/RH(D): B POS
Antibody Screen: NEGATIVE
Unit division: 0

## 2023-08-23 LAB — BODY FLUID CELL COUNT WITH DIFFERENTIAL
Eos, Fluid: 0 %
Lymphs, Fluid: 16 %
Monocyte-Macrophage-Serous Fluid: 76 % (ref 50–90)
Neutrophil Count, Fluid: 8 % (ref 0–25)
Total Nucleated Cell Count, Fluid: 55 uL (ref 0–1000)

## 2023-08-23 LAB — PREALBUMIN: Prealbumin: 19 mg/dL (ref 18–38)

## 2023-08-23 LAB — BPAM RBC
Blood Product Expiration Date: 202507192359
ISSUE DATE / TIME: 202506211045
Unit Type and Rh: 7300

## 2023-08-23 LAB — LACTATE DEHYDROGENASE, PLEURAL OR PERITONEAL FLUID: LD, Fluid: 110 U/L — ABNORMAL HIGH (ref 3–23)

## 2023-08-23 LAB — GLUCOSE, PLEURAL OR PERITONEAL FLUID: Glucose, Fluid: 114 mg/dL

## 2023-08-23 LAB — PHOSPHORUS: Phosphorus: 2 mg/dL — ABNORMAL LOW (ref 2.5–4.6)

## 2023-08-23 LAB — PROTEIN, PLEURAL OR PERITONEAL FLUID: Total protein, fluid: <3 g/dL

## 2023-08-23 LAB — DIGOXIN LEVEL: Digoxin Level: 1.1 ng/mL (ref 0.8–2.0)

## 2023-08-23 LAB — LACTATE DEHYDROGENASE: LDH: 402 U/L — ABNORMAL HIGH (ref 98–192)

## 2023-08-23 MED ORDER — LIDOCAINE HCL 1 % IJ SOLN
INTRAMUSCULAR | Status: AC
Start: 2023-08-23 — End: 2023-08-23
  Filled 2023-08-23: qty 20

## 2023-08-23 NOTE — Progress Notes (Signed)
 Overall, everything looks about the same.  I am not sure why she has enteric precautions.  I do not see any positive cultures..  She had a CAT scan.  She has some sclerotic bone lesions which may have been increased.  Liver looks fine.  She does have some ascites.  She has a left pleural effusion.  Maybe, we need to see about the ascites and see if there is any cancer in the ascites.  Again I think she has an element of dementia that is causing some difficulty and really assessing her.  She has had no bleeding.  She has had no fever.  There are no labs back yet today. She does have an elevated BNP.  Again, she could certainly have congestive heart failure issues.  I think an echocardiogram is going to be necessary.  I know that she is due for a PET scan.  I think this could certainly help us  out with respect to the extent of her disease.  It is certainly possible that she has other issues going on that are affecting her.  Again I think the echocardiogram is going to be quite helpful.  I do not know if she needs to have a paracentesis.  I know that she has a left pleural effusion.  I think she does need quite a bit of physical therapy.  She is somewhat deconditioned.  I suspect that we probably will have to stop the Enhertu  that she had been on.  We have had this going for quite a while.  She has done incredibly well.  However, I just think that we are going to have to make a change because of her overall quality of life and performance status.   Jeralyn Crease, MD  [place] 12:12

## 2023-08-23 NOTE — Progress Notes (Addendum)
 [facility]                                                                              Silvina Hackleman, is a 83 y.o. female, DOB - 08/06/1940, FMW:[id number] Admit date - 08/20/2023    Outpatient Primary MD for the patient is Gayle Saddie FALCON, PA-C  LOS - 1  days  No chief complaint on file.      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  Per spouse, patient has had poor appetite and weight loss over the last few weeks.  She was seen by her oncologist 2 weeks ago and was started on Marinol  and Megace  to help with appetite.  Over the last few days, patient had been significantly weak and had difficulty getting in bed.  In ED, BP soft in 90s, hemoglobin 9.9, (12.0 on 08/13/2023), platelets 57 (124 on 08/13/2023)  UA positive for UTI  Assessment & Plan    Principal Problem:   Generalized weakness, FTT - In the setting of metastatic breast CA, stage IV presenting with generalized weakness, poor p.o. intake, worsening pancytopenia, UTI -Transfused 1 unit packed RBCs on 6/21, H&H stable -Continue Marinol , Megace , fall precautions - Oncology following, seen by Dr. Timmy, palliative medicine consulted for GOC.  Right hip pain in the setting of metastatic breast CA, metastasis - Continue pain control  - Right hip x-ray shows sclerotic lesions, no obvious lytic lesion or bony destruction.  CT chest abdomen pelvis on 6/22 showed scattered sclerotic lesions in the bones slightly increased from 2024, no pathologic fractures   Pancytopenia with anemia, thrombocytopenia -Hemoglobin 9.9 on admission, status post 1 unit packed RBCs - H&H stable, platelets improving   Metastatic breast cancer, Osseous metastases - Management  per oncology,  -  Continue gabapentin , Megace , Marinol , as needed tramadol   -CT chest abdomen pelvis on 6/22 showed scattered sclerotic lesions in the bones slightly increased from 2024, no pathologic fractures, anasarca with moderate ascites, loculated moderate left pleural effusion - Will attempt US  to centesis on the left, likely malignant.  Currently no hypoxia or abdominal pain, hold off on paracentesis  Left moderate pleural effusion, also chronic pleural effusion -Likely secondary to breast malignancy - CT chest abdomen pelvis showed moderate loculated left pleural effusion and small right pleural effusion, emphysema, moderate ascites and anasarca - Will obtain ultrasound-guided thoracentesis on the left although no emergency as patient does not have any acute hypoxia, follow labs, likely malignant  UTI, acute cystitis - immunocompromised state, - Continue IV Rocephin , follow urine culture and sensitivities - Blood cultures negative so far    Atrial fibrillation - HR stable, continue Lopressor  - Hold Eliquis  in the setting of thrombocytopenia    Chronic diastolic HF -  Continue on digoxin , Lasix  and Lopressor  -2D echo showed EF 6065%, no regional WMA, left ventricular diastolic function could not be evaluated, RV SF, massive biatrial dilation suspect secondary to underlying A-fib  but could consider workup for infiltrative cardiomyopathy, left atrial size severely dilated, moderate AR - Follows cardiology, Dr. Shlomo, will discuss with cardiology regarding abnormal echo  Hypophosphatemia - Continue replacement     HLD - Continue atorvastatin    Hypothyroidism - Continue Armour - TSH 5.9   Seizure disorder - Continue Keppra    GERD - Continue Protonix    Dementia - Continue Aricept  and memantine    Asthma - No wheezing, continue home bronchodilators   Estimated body mass index is 23.29 kg/m as calculated from the following:   Height as of this encounter: 5' (1.524  m).   Weight as of this encounter: 54.1 kg.  Code Status: Full code DVT Prophylaxis:  SCDs Start: 08/21/23 0809   Level of Care: Level of care: Med-Surg Family Communication: Updated patient Disposition Plan:      Remains inpatient appropriate:     Procedures:  2D echo  Consultants:   Oncology Palliative medicine Cardiology  Antimicrobials:   Anti-infectives (From admission, onward)    Start     Dose/Rate Route Frequency Ordered Stop   08/21/23 2000  cefTRIAXone  (ROCEPHIN ) 1 g in sodium chloride  0.9 % 100 mL IVPB        1 g 200 mL/hr over 30 Minutes Intravenous Every 24 hours 08/21/23 1821            Medications  atorvastatin   40 mg Oral QPM   B-complex with vitamin C  1 tablet Oral Daily   calcium -vitamin D   1 tablet Oral Q breakfast   cholecalciferol   2,000 Units Oral Daily   digoxin   0.125 mg Oral Daily   donepezil   5 mg Oral QHS   dronabinol   5 mg Oral BID AC   feeding supplement  237 mL Oral TID BM   fluticasone   1 spray Each Nare BID   fluticasone  furoate-vilanterol  1 puff Inhalation Daily   folic acid   500 mcg Oral Daily   furosemide   20 mg Oral BID   gabapentin   400 mg Oral BID   latanoprost   1 drop Both Eyes BID   levETIRAcetam   500 mg Oral QHS   loratadine   10 mg Oral Daily   megestrol   400 mg Oral BID   memantine   10 mg Oral BID   metoprolol  tartrate  50 mg Oral BID   multivitamins with iron   1 tablet Oral Daily   pantoprazole   40 mg Oral Daily   phosphorus  500 mg Oral BID   thyroid   90 mg Oral Daily      Subjective:   Zurie Platas was seen and examined today.  Flat affect, currently states pain however minimal responses, does not engage in conversation for ROS.  No chest pain, shortness of breath, fever chills, nausea or vomiting.    Objective:   Vitals:   08/22/23 2033 08/23/23 0536 08/23/23 0803 08/23/23 1342  BP: 125/80 129/77  127/77  Pulse: (!) 104 84  92  Resp: 18 18    Temp: 99 F (37.2 C) 99.2 F (37.3 C)  98.4 F (36.9  C)  TempSrc: Oral Oral  Oral  SpO2: 94% 94% 93% 92%  Weight:      Height:        Intake/Output Summary (Last 24 hours) at 08/23/2023 1346 Last data filed at 08/23/2023 0600 Gross per 24 hour  Intake 617.62 ml  Output --  Net 617.62 ml     Wt Readings from Last 3 Encounters:  08/22/23 54.1 kg  08/13/23 54.4 kg  08/03/23 54.9 kg   Physical Exam General: Awake and alert, flat affect, NAD, ill-appearing  Cardiovascular: S1 S2 clear, RRR.  Respiratory: CTAB, no wheezing Gastrointestinal: Soft, nontender, nondistended, NBS Ext: no pedal edema bilaterally Neuro: no new deficits Psych: flat affect    Data Reviewed:  I have personally reviewed following labs    CBC Lab Results  Component Value Date   WBC 4.5 08/23/2023   RBC 3.64 (L) 08/23/2023   HGB 12.2 08/23/2023   HCT 35.8 (L) 08/23/2023   MCV 98.4 08/23/2023   MCH 33.5 08/23/2023   PLT 60 (L) 08/23/2023   MCHC 34.1 08/23/2023   RDW 19.6 (H) 08/23/2023   LYMPHSABS 0.2 (L) 08/23/2023   MONOABS 0.4 08/23/2023   EOSABS 0.0 08/23/2023   BASOSABS 0.0 08/23/2023     Last metabolic panel Lab Results  Component Value Date   NA 137 08/23/2023   K 3.4 (L) 08/23/2023   CL 108 08/23/2023   CO2 19 (L) 08/23/2023   BUN 16 08/23/2023   CREATININE 0.59 08/23/2023   GLUCOSE 78 08/23/2023   GFRNONAA >60 08/23/2023   GFRAA 61 09/28/2019   CALCIUM  7.5 (L) 08/23/2023   PHOS 2.0 (L) 08/23/2023   PROT 5.2 (L) 08/23/2023   ALBUMIN  2.6 (L) 08/23/2023   LABGLOB 2.2 08/05/2023   AGRATIO 2.0 10/09/2021   BILITOT 0.9 08/23/2023   ALKPHOS 63 08/23/2023   AST 54 (H) 08/23/2023   ALT 45 (H) 08/23/2023   ANIONGAP 10 08/23/2023    CBG (last 3)  No results for input(s): GLUCAP in the last 72 hours.    Coagulation Profile: No results for input(s): INR, PROTIME in the last 168 hours.   Radiology Studies: I have personally reviewed the imaging studies  ECHOCARDIOGRAM COMPLETE Result Date: 08/23/2023     ECHOCARDIOGRAM REPORT   Patient Name:   VENNESA BASTEDO Date of Exam: 08/23/2023 Medical Rec #:  [id number]   Height:       60.0 in Accession #:    [id number]  Weight:       119.3 lb Date of Birth:  [birth year]   BSA:          1.498 m Patient Age:    82 years    BP:           129/77 mmHg Patient Gender: F           HR:           112 bpm. Exam Location:  Inpatient Procedure: 2D Echo, Cardiac Doppler and Color Doppler (Both Spectral and Color            Flow Doppler were utilized during procedure). Indications:    Dyspnea  History:        Patient has prior history of Echocardiogram examinations, most                 recent 03/26/2023. Risk Factors:Hypertension.  Sonographer:    Benard Stallion Referring Phys: [id number] PETER R ENNEVER IMPRESSIONS  1. Left ventricular ejection fraction, by estimation, is 60 to 65%. The left ventricle has normal function. The left ventricle has no regional wall motion abnormalities. Left ventricular diastolic function could not be evaluated.  2. Right ventricular systolic function is normal. The right ventricular size is normal. There is normal pulmonary artery systolic pressure. The estimated right ventricular systolic pressure is 35.5 mmHg.  3. Massive biatrial dilation, suspect secondary to underlying afib but could consider workup for infiltrative cardiomyopathy. Left atrial size was severely  dilated.  4. Right atrial size was severely dilated.  5. Moderate pleural effusion.  6. The mitral valve is normal in structure. Mild mitral valve regurgitation. No evidence of mitral stenosis.  7. The aortic valve is tricuspid. There is mild calcification of the aortic valve. Aortic valve regurgitation is moderate. Aortic valve sclerosis/calcification is present, without any evidence of aortic stenosis.  8. The inferior vena cava is normal in size with greater than 50% respiratory variability, suggesting right atrial pressure of 3 mmHg. FINDINGS  Left Ventricle: Left ventricular ejection fraction, by  estimation, is 60 to 65%. The left ventricle has normal function. The left ventricle has no regional wall motion abnormalities. The left ventricular internal cavity size was normal in size. There is  no left ventricular hypertrophy. Left ventricular diastolic function could not be evaluated due to atrial fibrillation. Left ventricular diastolic function could not be evaluated. Right Ventricle: The right ventricular size is normal. No increase in right ventricular wall thickness. Right ventricular systolic function is normal. There is normal pulmonary artery systolic pressure. The tricuspid regurgitant velocity is 2.85 m/s, and  with an assumed right atrial pressure of 3 mmHg, the estimated right ventricular systolic pressure is 35.5 mmHg. Left Atrium: Massive biatrial dilation, suspect secondary to underlying afib but could consider workup for infiltrative cardiomyopathy. Left atrial size was severely dilated. Right Atrium: Right atrial size was severely dilated. Pericardium: There is no evidence of pericardial effusion. Mitral Valve: The mitral valve is normal in structure. Mild mitral valve regurgitation. No evidence of mitral valve stenosis. Tricuspid Valve: The tricuspid valve is normal in structure. Tricuspid valve regurgitation is mild . No evidence of tricuspid stenosis. Aortic Valve: The aortic valve is tricuspid. There is mild calcification of the aortic valve. Aortic valve regurgitation is moderate. Aortic regurgitation PHT measures 376 msec. Aortic valve sclerosis/calcification is present, without any evidence of aortic stenosis. Aortic valve mean gradient measures 5.0 mmHg. Aortic valve peak gradient measures 9.0 mmHg. Aortic valve area, by VTI measures 2.12 cm. Pulmonic Valve: The pulmonic valve was normal in structure. Pulmonic valve regurgitation is not visualized. No evidence of pulmonic stenosis. Aorta: The aortic root is normal in size and structure. Venous: The inferior vena cava is normal in  size with greater than 50% respiratory variability, suggesting right atrial pressure of 3 mmHg. IAS/Shunts: There is right bowing of the interatrial septum, suggestive of elevated left atrial pressure. No atrial level shunt detected by color flow Doppler. Additional Comments: There is a moderate pleural effusion.  LEFT VENTRICLE PLAX 2D LVIDd:         3.70 cm   Diastology LVIDs:         2.40 cm   LV e' medial:    8.92 cm/s LV PW:         0.85 cm   LV E/e' medial:  14.3 LV IVS:        0.90 cm   LV e' lateral:   10.40 cm/s LVOT diam:     2.20 cm   LV E/e' lateral: 12.3 LV SV:         46 LV SV Index:   31 LVOT Area:     3.80 cm  RIGHT VENTRICLE RV Basal diam:  2.70 cm RV Mid diam:    2.10 cm RV S prime:     11.20 cm/s TAPSE (M-mode): 2.0 cm LEFT ATRIUM              Index  RIGHT ATRIUM           Index LA diam:        5.20 cm  3.47 cm/m   RA Area:     25.40 cm LA Vol (A2C):   146.0 ml 97.43 ml/m  RA Volume:   80.20 ml  53.52 ml/m LA Vol (A4C):   141.0 ml 94.10 ml/m LA Biplane Vol: 145.0 ml 96.77 ml/m  AORTIC VALVE AV Area (Vmax):    2.00 cm AV Area (Vmean):   1.82 cm AV Area (VTI):     2.12 cm AV Vmax:           150.00 cm/s AV Vmean:          102.000 cm/s AV VTI:            0.217 m AV Peak Grad:      9.0 mmHg AV Mean Grad:      5.0 mmHg LVOT Vmax:         78.80 cm/s LVOT Vmean:        48.800 cm/s LVOT VTI:          0.121 m LVOT/AV VTI ratio: 0.56 AI PHT:            376 msec  AORTA Ao Root diam: 3.30 cm Ao Asc diam:  3.50 cm MITRAL VALVE                TRICUSPID VALVE MV Area (PHT): 4.63 cm     TR Peak grad:   32.5 mmHg MV Decel Time: 164 msec     TR Vmax:        285.00 cm/s MR Peak grad: 90.2 mmHg MR Vmax:      475.00 cm/s   SHUNTS MV E velocity: 128.00 cm/s  Systemic VTI:  0.12 m MV A velocity: 50.50 cm/s   Systemic Diam: 2.20 cm MV E/A ratio:  2.53 Morene Brownie Electronically signed by Morene Brownie Signature Date/Time: 08/23/2023/10:31:27 AM    Final    CT CHEST ABDOMEN PELVIS W CONTRAST Result  Date: 08/22/2023 CLINICAL DATA:  Invasive breast cancer, stage IV. Assess treatment response. EXAM: CT CHEST, ABDOMEN, AND PELVIS WITH CONTRAST TECHNIQUE: Multidetector CT imaging of the chest, abdomen and pelvis was performed following the standard protocol during bolus administration of intravenous contrast. RADIATION DOSE REDUCTION: This exam was performed according to the departmental dose-optimization program which includes automated exposure control, adjustment of the mA and/or kV according to patient size and/or use of iterative reconstruction technique. CONTRAST:  OMNIPAQUE  IOHEXOL  300 MG/ML  SOLN COMPARISON:  05/04/2023, 08/13/2022. FINDINGS: CT CHEST FINDINGS Cardiovascular: The heart is enlarged and there is a small pericardial effusion. The distal tip of a chest port terminates in the superior vena cava. There is atherosclerotic calcification of the aorta without evidence of aneurysm. The pulmonary trunk is normal in caliber. Mediastinum/Nodes: No mediastinal, hilar, or axillary lymphadenopathy is seen. Surgical clips are noted in the axilla on the left. The trachea and esophagus are within normal limits. There is a small hiatal hernia. Lungs/Pleura: There is a moderate loculated pleural effusion on the left. A small pleural effusion is noted on the right. Centrilobular emphysematous changes are present in the lungs. Atelectasis is noted bilaterally. No pneumothorax. Treatment related changes are noted in the left lung. No discrete pulmonary nodule or mass is seen. Musculoskeletal: Degenerative changes are present in the thoracic spine. A few scattered sclerotic lesions are noted in the thoracic spine and ribs, slightly increased from  2024. No pathologic fracture is seen. CT ABDOMEN PELVIS FINDINGS Hepatobiliary: No focal liver abnormality is seen. No gallstones, gallbladder wall thickening, or biliary dilatation. Pancreas: Unremarkable. No pancreatic ductal dilatation or surrounding inflammatory  changes. Spleen: Normal in size without focal abnormality. 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 The right adrenal gland is within normal limits. The kidneys enhance symmetrically. No renal calculus or hydronephrosis bilaterally. The bladder is unremarkable. Stomach/Bowel: There is a small hiatal hernia. The stomach is otherwise within normal limits. No bowel obstruction, free air, or pneumatosis is seen. Scattered diverticular present along the colon without evidence of diverticulitis. Appendix appears normal. Vascular/Lymphatic: Aortic atherosclerosis. No abdominal or pelvic lymphadenopathy is seen by size criteria. Reproductive: Uterus and bilateral adnexa are unremarkable. Other: Moderate ascites is noted in all 4 quadrants. There is mild anasarca. Musculoskeletal: Fixation hardware is noted in the proximal left femur. Scattered sclerotic lesions are seen in the bones, slightly increased from 2024. There degenerative changes in the lumbar spine. There is a compression deformity at the L5 vertebral body, unchanged from 2024. IMPRESSION: 1. Scattered sclerotic lesions in the in the bones, slightly increased from 2024. No new pathologic fracture is seen. 2. Moderate loculated left pleural effusion and small right pleural effusion with scattered atelectasis. 3. Emphysema. 4. Moderate ascites and anasarca. 5. Diverticulosis without diverticulitis. 6. Aortic atherosclerosis. Electronically Signed   By: Leita Birmingham M.D.   On: 08/22/2023 15:35   DG HIP UNILAT WITH PELVIS 2-3 VIEWS RIGHT Result Date: 08/22/2023 CLINICAL DATA:  Right hip pain. History of metastatic breast cancer. EXAM: DG HIP (WITH OR WITHOUT PELVIS) 2-3V RIGHT COMPARISON:  PET CT 05/04/2023 reviewed FINDINGS: Sclerotic lesions in the pelvis on prior PET are not well demonstrated by radiograph. There is no obvious lytic lesion or bony destruction. No evidence of acute fracture. The hip joint  spaces preserved. Pubic rami are intact. Right pelvic phleboliths. Left femoral intramedullary nail with trans trochanteric screw fixation is partially included in the field of view. IMPRESSION: Sclerotic lesions in the pelvis on prior PET are not well demonstrated by radiograph. No obvious lytic lesion or bony destruction. If there is clinical concern for metastatic disease, recommend MRI for further assessment. Electronically Signed   By: Andrea Gasman M.D.   On: 08/22/2023 13:57       Javar Eshbach M.D. [facility] 08/23/2023, 1:46 PM  Available via Epic secure chat 7am-7pm After 7 pm, please refer to night coverage provider listed on amion.

## 2023-08-23 NOTE — Progress Notes (Signed)
 Daily Progress Note   Patient Name: Patricia [name]       Date: 08/23/2023 DOB: February 26, 1941  Age: 83 y.o. MRN#: [id number] Attending Physician: Davia Nydia POUR, MD Primary Care Physician: Gayle Saddie JULIANNA DEVONNA Admit Date: 08/20/2023 Length of Stay: 1 day  Reason for Consultation/Follow-up: Establishing goals of care  Subjective:   Reviewed recent EMR including documentation from hospitalist and oncologist.  Reviewed recent CT imaging noting slight increase in sclerotic lesions related to cancer.  Patient also had echo performed today noting left ventricular EF 60-65%.  Presented to bedside to see patient.  Patient receiving nurse care.  Able to speak separately with patient's husband, Patricia [name].  Reviewed patient's imaging and echo at his request.  Husband noted that he had also discussed care with family members who are in the healthcare field.  With permission, able to express concern that based on patient's physical and mental deterioration that husband has described, patient may not be a candidate for further cancer directed therapies as they may cause her more harm than benefit.  Has been acknowledged this.  Noted would defer this ultimate decision regarding cancer directed therapies to patient's oncologist Dr. Timmy.  Did discuss with patient's physical aspect concerns that patient could not retain information to continue building on strength with physical therapy and husband agreed with this. Tried to explore care planning moving forward.  Husband feels at this time that patient may need long-term care facility.  Husband noted that he and patient have long-term care policies to prepare for this.  Noted would involve TOC to assist with coordination of care.  Husband noted that he would like to pursue patient potentially going to [facility] facility which is near him for long-term care.  Also discussed that should patient not be a candidate for further cancer directed therapies and wanted to focus on her  quality of life and comfort outside of the hospital, could involve hospice support at the long-term care facility.  Husband acknowledging this and that he has known people who have received hospice support and found it very beneficial.    Spent time answering questions as able.  Noted would inform care team of discussion to allow for care coordination moving forward.  Husband voiced appreciation for this.  Discussed care with hospitalist, TOC, RN, and oncologist after visit.  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 Oncology consulted for recommendations. Palliative medicine team consulted to assist with complex medical decision making.   Recommendations/Plan: # Complex medical decision making/goals of care:     -Patient unable to participate in complex medical decision-making due to underlying medical status.  Patient has underlying dementia.      - Discussed care with patient's husband as detailed above in HPI.  Reviewed imaging obtained today at husband's request.  Expressed concern that with patient's deterioration in functional and mental status, may not be a candidate for further cancer directed therapies as concerned  therapies would cause her more harm than benefit.  Noted oncology would be the team to determine if patient is appropriate for further cancer directed therapies.  Husband acknowledged patient's debilitated state.  Husband considering that patient needs long-term care support; husband specifically requesting evaluation for patient being at [facility] as that is close to him.  Discussed that if patient is not a candidate for further cancer directed therapies and goal would be to allow her quality time and symptom management, could involve hospice support at long-term care facility.  Palliative medicine team will continue to follow along to engage in conversations as able and appropriate.                -  Code Status: Limited: Do not attempt resuscitation (DNR) -DNR-LIMITED -Do Not Intubate/DNI    # Symptom management Defer to oncologist as he has been managing patient's symptom medications.  Will express appropriateness of monitoring patient's with reported history of seizures if receiving tramadol  as tramadol  can lower seizure threshold.  Also appropriate to monitor since patient receiving Megace  which can cause adrenal insufficiency and hypercoagulability which is concerning in a patient with cancer.   # Psycho-social/Spiritual Support:  - Support System: Husband, son, daughter   # Discharge Planning:  To Be Determined  Discussed with: Hospitalist, TOC, RN, oncologist, patient's husband  Thank you for allowing the palliative care team to participate in the care Patricia [name].  Tinnie Radar, DO Palliative Care Provider PMT # [phone]  If patient remains symptomatic despite maximum doses, please call PMT at [phone] between 0700 and 1900. Outside of these hours, please call attending, as PMT does not have night coverage.  Personally spent 50 minutes in patient care including extensive chart review (labs, imaging, progress/consult notes, vital signs), medically appropraite exam, discussed with  treatment team, education to patient, family, and staff, documenting clinical information, medication review and management, coordination of care, and available advanced directive documents.

## 2023-08-23 NOTE — Procedures (Signed)
 Ultrasound-guided diagnostic and therapeutic left thoracentesis performed yielding 720 cc of yellow fluid. No immediate complications. Follow-up chest x-ray pending. The fluid was submitted to the laboratory for preordered studies.  Due to persistent patient coughing only the above amount of fluid was removed today. EBL < 1 cc.

## 2023-08-23 NOTE — Plan of Care (Signed)

## 2023-08-23 NOTE — Progress Notes (Signed)
*  PRELIMINARY RESULTS* Echocardiogram 2D Echocardiogram has been performed.  Patricia [name] E Ellee Wawrzyniak 08/23/2023, 10:00 AM

## 2023-08-24 DIAGNOSIS — C799 Secondary malignant neoplasm of unspecified site: Secondary | ICD-10-CM | POA: Diagnosis not present

## 2023-08-24 DIAGNOSIS — I4891 Unspecified atrial fibrillation: Secondary | ICD-10-CM

## 2023-08-24 DIAGNOSIS — I5032 Chronic diastolic (congestive) heart failure: Secondary | ICD-10-CM | POA: Diagnosis not present

## 2023-08-24 DIAGNOSIS — I340 Nonrheumatic mitral (valve) insufficiency: Secondary | ICD-10-CM

## 2023-08-24 DIAGNOSIS — I361 Nonrheumatic tricuspid (valve) insufficiency: Secondary | ICD-10-CM

## 2023-08-24 DIAGNOSIS — J90 Pleural effusion, not elsewhere classified: Secondary | ICD-10-CM | POA: Diagnosis not present

## 2023-08-24 DIAGNOSIS — R531 Weakness: Secondary | ICD-10-CM | POA: Diagnosis not present

## 2023-08-24 LAB — CBC WITH DIFFERENTIAL/PLATELET
Abs Immature Granulocytes: 0.03 10*3/uL (ref 0.00–0.07)
Basophils Absolute: 0 10*3/uL (ref 0.0–0.1)
Basophils Relative: 1 %
Eosinophils Absolute: 0 10*3/uL (ref 0.0–0.5)
Eosinophils Relative: 0 %
HCT: 40 % (ref 36.0–46.0)
Hemoglobin: 12.9 g/dL (ref 12.0–15.0)
Immature Granulocytes: 1 %
Lymphocytes Relative: 6 %
Lymphs Abs: 0.2 10*3/uL — ABNORMAL LOW (ref 0.7–4.0)
MCH: 33.3 pg (ref 26.0–34.0)
MCHC: 32.3 g/dL (ref 30.0–36.0)
MCV: 103.4 fL — ABNORMAL HIGH (ref 80.0–100.0)
Monocytes Absolute: 0.3 10*3/uL (ref 0.1–1.0)
Monocytes Relative: 7 %
Neutro Abs: 3.1 10*3/uL (ref 1.7–7.7)
Neutrophils Relative %: 85 %
Platelets: 81 10*3/uL — ABNORMAL LOW (ref 150–400)
RBC: 3.87 MIL/uL (ref 3.87–5.11)
RDW: 19.4 % — ABNORMAL HIGH (ref 11.5–15.5)
WBC: 3.7 10*3/uL — ABNORMAL LOW (ref 4.0–10.5)
nRBC: 0 % (ref 0.0–0.2)

## 2023-08-24 LAB — COMPREHENSIVE METABOLIC PANEL WITH GFR
ALT: 43 U/L (ref 0–44)
AST: 56 U/L — ABNORMAL HIGH (ref 15–41)
Albumin: 2.6 g/dL — ABNORMAL LOW (ref 3.5–5.0)
Alkaline Phosphatase: 59 U/L (ref 38–126)
Anion gap: 9 (ref 5–15)
BUN: 15 mg/dL (ref 8–23)
CO2: 22 mmol/L (ref 22–32)
Calcium: 7.4 mg/dL — ABNORMAL LOW (ref 8.9–10.3)
Chloride: 109 mmol/L (ref 98–111)
Creatinine, Ser: 0.7 mg/dL (ref 0.44–1.00)
GFR, Estimated: >60 mL/min (ref >60)
Glucose, Bld: 85 mg/dL (ref 70–99)
Potassium: 3 mmol/L — ABNORMAL LOW (ref 3.5–5.1)
Sodium: 140 mmol/L (ref 135–145)
Total Bilirubin: 0.9 mg/dL (ref 0.0–1.2)
Total Protein: 5.2 g/dL — ABNORMAL LOW (ref 6.5–8.1)

## 2023-08-24 LAB — URINE CULTURE: Culture: 10000 — AB

## 2023-08-24 MED ORDER — FUROSEMIDE 20 MG PO TABS: 20.0000 mg | ORAL_TABLET | Freq: Two times a day (BID) | ORAL | Status: DC

## 2023-08-24 MED ORDER — CHLORHEXIDINE GLUCONATE CLOTH 2 % EX PADS
6.0000 | MEDICATED_PAD | Freq: Every day | CUTANEOUS | Status: DC
  Administered 2023-08-25 – 2023-09-01 (×8): 6 via TOPICAL

## 2023-08-24 MED ORDER — POTASSIUM CHLORIDE CRYS ER 20 MEQ PO TBCR
40.0000 meq | EXTENDED_RELEASE_TABLET | Freq: Two times a day (BID) | ORAL | Status: AC
  Administered 2023-08-24 (×2): 40 meq via ORAL
  Filled 2023-08-24 (×2): qty 2

## 2023-08-24 MED ORDER — SPIRONOLACTONE 25 MG PO TABS
25.0000 mg | ORAL_TABLET | Freq: Every day | ORAL | Status: DC
  Administered 2023-08-24 – 2023-09-09 (×15): 25 mg via ORAL
  Filled 2023-08-24 (×16): qty 1

## 2023-08-24 MED ORDER — SODIUM CHLORIDE 0.9% FLUSH
10.0000 mL | Freq: Two times a day (BID) | INTRAVENOUS | Status: DC
  Administered 2023-08-24 – 2023-09-07 (×17): 10 mL

## 2023-08-24 MED ORDER — FUROSEMIDE 40 MG PO TABS
40.0000 mg | ORAL_TABLET | Freq: Two times a day (BID) | ORAL | Status: DC
  Administered 2023-08-25 – 2023-09-09 (×30): 40 mg via ORAL
  Filled 2023-08-24 (×32): qty 1

## 2023-08-24 MED ORDER — FUROSEMIDE 10 MG/ML IJ SOLN
40.0000 mg | Freq: Once | INTRAMUSCULAR | Status: AC
  Administered 2023-08-24: 40 mg via INTRAVENOUS
  Filled 2023-08-24: qty 4

## 2023-08-24 MED ORDER — SODIUM CHLORIDE 0.9% FLUSH: 10.0000 mL | INTRAVENOUS | Status: DC | PRN

## 2023-08-24 NOTE — Consult Note (Addendum)
 Cardiology Consultation   Patient ID: Marja Adderley MRN: [id number]; DOB: 11-06-1940  Admit date: 08/20/2023 Date of Consult: 08/24/2023  PCP:  Gayle Saddie JULIANNA DEVONNA   [facility] Providers Cardiologist:  Wilbert Bihari, MD  Electrophysiologist:  Will Gladis Norton, MD     Patient Profile: Sanita Estrada is a 83 y.o. female with a hx of chronic diastolic congestive heart failure, atrial fibrillation on Eliquis , moderate to severe mitral regurgitation, moderate to severe tricuspid regurgitation, hypertension, prior CVA, lung cancer resection in 2003, metastatic breast cancer s/p lumpectomy, radiation, tamoxifen  and mild sleep apnea who is being seen 08/24/2023 for the evaluation of chronic diastolic heart failure at the request of Rai Ripudeep.  History of Present Illness: Ms. Clippinger is a 83 year old female who is followed by Dr. Bihari and the A-fib clinic. Was diagnosed with mild obstructive sleep apnea on 01/2020.  Has been placed on BiPAP.  On 11/2020 an echocardiogram was done because of the patient's known history of mitral regurgitation and showed moderate to severe mitral and tricuspid regurgitation.  Because of this a follow-up TEE was done on 12/2020 and showed severely dilated left and right atrial size, myxomatous tricuspid valve with moderate regurgitation, and moderate to severe mitral regurgitation with moderate calcification of the posterior mitral leaflets.  Saw Dr. Wonda  on 01/2021 for the moderate to severe tricuspid and mitral regurgitation.  She was not felt to be a candidate for transcatheter therapies because of the severity of her posterior leaflet calcification.  Medical therapy and continued annual echocardiograms were recommended. Echo on 03/2023 showed severe biatrial dilation, moderate mitral regurgitation, moderate tricuspid regurgitation, mild aortic regurgitation, and mildly elevated pulmonary artery systolic pressure.  In 2023 she underwent radiation for  metastatic adenocarcinoma breast cancer.  The patient presented to the emergency department on 08/20/2023 for weakness, memory loss, poor oral intake, and right-sided hip pain.  She was admitted to the hospitalist service and is being seen by oncology and palliative care.  Oncology suspects that this weakness is likely from her cancer progressing. The patient's CODE STATUS was changed to DNR/DNI. A CT abdomen and pelvis was ordered and showed increasing sclerotic bone lesions, moderate ascites, moderate left pleural effusion, mild right pleural effusion, aortic atherosclerosis, and emphysema.  A thoracentesis was done on 08/23/2023 and collected 720 cc of yellow fluid.  The fluid appears to be transudative.  CHEM panel on 08/23/2023 showed hypoalbuminemia of 2.6, and hypokalema of 3.4.    On interview patient reported patient was confused and was uncertain why she was in the hospital.  She knew where she was at, her name, and her date of birth.  Denies any shortness of breath, chest pain, nausea, vomiting, fever, chills, and diaphoresis.   Echocardiogram on 08/23/2023 showed normal LVEF of 60 to 65%, massive biatrial dilation, moderate pleural effusion, mild mitral regurgitation, and moderate aortic regurgitation.  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 Dx 04/2015 at time of stroke. b. DCCV 06/2015 - did not hold. Amio started then stopped due to QT prolongation.   Chronic diastolic heart failure (HCC) 06/17/2015   CKD (chronic kidney disease), stage III (HCC)    Cough variant asthma    Dementia (HCC)    Diverticulosis    Essential  hypertension    GERD (gastroesophageal reflux disease)    Glaucoma    Hiatal hernia    Hyperlipidemia    Hypothyroidism    Internal hemorrhoids    Laryngospasm    Mitral regurgitation     mild to moderate by echo 12/2019 and moderate by TEE 12/2020   Multinodular goiter    Osteopenia    Postmenopausal    Pulmonary HTN (HCC)     Moderate with PASP by echo 2018 likely Group 2 from pulmonary venous HTN from CHF>>normal PAP by echo 12/2019   Radiation 10/22/14-11/23/14   Left Breast   Stricture and stenosis of cervix    Stroke (HCC)    TIAs    Past Surgical History:  Procedure Laterality Date   BREAST LUMPECTOMY Left    BREAST SURGERY     CARDIOVERSION N/A 06/24/2015   Procedure: CARDIOVERSION;  Surgeon: Wilbert JONELLE Bihari, MD;  Location: [facility];  Service: Cardiovascular;  Laterality: N/A;   CARDIOVERSION N/A 05/19/2016   Procedure: CARDIOVERSION;  Surgeon: Wilbert JONELLE Bihari, MD;  Location: [facility];  Service: Cardiovascular;  Laterality: N/A;   COLONOSCOPY     EYE SURGERY Bilateral    cataract removal by Dr. Patrcia   FEMUR IM NAIL Left 07/28/2022   Procedure: INTRAMEDULLARY (IM) NAIL FEMORAL;  Surgeon: Sheril Coy, MD;  Location: [facility];  Service: Orthopedics;  Laterality: Left;   IR IMAGING GUIDED PORT INSERTION  10/30/2022   IR THORACENTESIS ASP PLEURAL SPACE W/IMG GUIDE  05/10/2023   LUNG CANCER SURGERY  2003   resection carcinoid lingula-lt upper lobe   RADIOACTIVE SEED GUIDED PARTIAL MASTECTOMY WITH AXILLARY SENTINEL LYMPH NODE BIOPSY Left 06/26/2014   Procedure: RADIOACTIVE SEED GUIDED PARTIAL MASTECTOMY WITH AXILLARY SENTINEL LYMPH NODE BIOPSY;  Surgeon: Jina Nephew, MD;  Location: [facility];  Service: General;  Laterality: Left;   RE-EXCISION OF BREAST LUMPECTOMY Left 08/07/2014   Procedure: RE-EXCISION OF LEFT BREAST LUMPECTOMY;  Surgeon: Jina Nephew, MD;  Location: [facility];  Service: General;  Laterality: Left;   RIGHT HEART CATH N/A 04/27/2016   Procedure: Right Heart Cath;  Surgeon: Ezra GORMAN Shuck, MD;  Location: [facility] INVASIVE CV LAB;  Service: Cardiovascular;  Laterality: N/A;   TEE WITHOUT CARDIOVERSION N/A  01/07/2021   Procedure: TRANSESOPHAGEAL ECHOCARDIOGRAM (TEE);  Surgeon: Alveta Aleene PARAS, MD;  Location: [facility] ENDOSCOPY;  Service: Cardiovascular;  Laterality: N/A;     Home Medications:  Prior to Admission medications   Medication Sig Start Date End Date Taking? Authorizing Provider  albuterol  (PROVENTIL  HFA;VENTOLIN  HFA) 108 (90 Base) MCG/ACT inhaler Inhale 2 puffs into the lungs daily as needed for wheezing or shortness of breath.   Yes [provider]  apixaban  (ELIQUIS ) 2.5 MG TABS tablet Take 1 tablet (2.5 mg total) by mouth 2 (two) times daily. 07/28/23  Yes Turner, Wilbert JONELLE, MD  Apoaequorin (PREVAGEN) 10 MG CAPS Take 1 capsule by mouth every morning. 11/28/20  Yes Boscia, Heather E, NP  atorvastatin  (LIPITOR) 40 MG tablet TAKE 1 TABLET DAILY BEFORE SUPPER 05/31/23  Yes Turner, Traci R, MD  bimatoprost (LUMIGAN) 0.01 % SOLN Place 1 drop into both eyes 2 (two) times daily.   Yes [provider]  calcium -vitamin D  (OSCAL WITH D) 500-200 MG-UNIT tablet Take 1 tablet by mouth daily with breakfast. 11/03/17  Yes Opalski, Deborah, DO  Cholecalciferol  (VITAMIN D3) 50 MCG (2000 UT) capsule Take 2,000 Units by mouth daily.   Yes [provider]  dexamethasone  (DECADRON )  4 MG tablet Take 2 tablets (8 mg) by mouth daily for 3 days starting the day after chemotherapy. Take with food. 07/02/23  Yes Timmy Maude SAUNDERS, MD  digoxin  (LANOXIN ) 0.125 MG tablet Take 1 tablet (0.125 mg total) by mouth daily. 09/04/22  Yes Conte, Tessa N, PA-C  donepezil  (ARICEPT ) 5 MG tablet Take 1 tablet (5 mg total) by mouth at bedtime. 04/19/23  Yes McCue, Harlene, NP  dorzolamide -timolol  (COSOPT ) 22.3-6.8 MG/ML ophthalmic solution Place 1 drop into both eyes at bedtime.   Yes [provider]  dronabinol  (MARINOL ) 2.5 MG capsule Take 1 capsule (2.5 mg total) by mouth 2 (two) times daily before lunch and supper. 08/13/23  Yes Timmy Maude SAUNDERS, MD  fluticasone  (FLONASE ) 50 MCG/ACT nasal spray 1 spray each  nostril following sinus rinses twice daily 02/08/18  Yes Opalski, Deborah, DO  fluticasone -salmeterol (ADVAIR  HFA) 230-21 MCG/ACT inhaler USE 2 INHALATIONS TWICE A DAY 06/25/22  Yes Wallace Search A, PA  folic acid  (FOLVITE ) 400 MCG tablet Take 400 mcg by mouth daily.   Yes [provider]  furosemide  (LASIX ) 20 MG tablet TAKE 1 TABLET TWICE A DAY. TAKE ADDITIONAL 1 TABLET AS NEEDED FOR SWELLING Patient taking differently: Take 20 mg by mouth 2 (two) times daily. 05/07/23  Yes Turner, Wilbert SAUNDERS, MD  gabapentin  (NEURONTIN ) 400 MG capsule TAKE 1 CAPSULE TWICE A DAY 11/17/22  Yes Wallace Search A, PA  L-Methylfolate-B12-B6-B2 (CEREFOLIN) 07-31-48-5 MG TABS Take 1 tablet by mouth every morning. 03/15/23  Yes McCue, Harlene, NP  levETIRAcetam  (KEPPRA  XR) 500 MG 24 hr tablet TAKE 1 TABLET AT BEDTIME 09/07/22  Yes McCue, Harlene, NP  lidocaine -prilocaine  (EMLA ) cream Apply a dime size of cream to port-a-cath 1-2 hours prior to access. Cover with [employer]. 11/09/22  Yes Timmy Maude SAUNDERS, MD  loratadine  (CLARITIN ) 10 MG tablet Take 10 mg by mouth daily.   Yes [provider]  megestrol  (MEGACE ) 400 MG/10ML suspension Take 10 mLs (400 mg total) by mouth 2 (two) times daily. 08/13/23  Yes Ennever, Maude SAUNDERS, MD  memantine  (NAMENDA ) 10 MG tablet TAKE 1 TABLET TWICE A DAY 09/15/22  Yes Sethi, Pramod S, MD  metoprolol  tartrate (LOPRESSOR ) 50 MG tablet TAKE 1 TABLET TWICE A DAY 08/11/23  Yes Turner, Wilbert SAUNDERS, MD  Multiple Vitamins-Iron  (MULTIVITAMIN/IRON ) TABS Take 1 tablet by mouth daily.   Yes [provider]  Netarsudil  Dimesylate (RHOPRESSA) 0.02 % SOLN Place 1 drop into both eyes at bedtime.   Yes [provider]  ondansetron  (ZOFRAN ) 8 MG tablet Take 1 tablet (8 mg total) by mouth every 8 (eight) hours as needed for nausea or vomiting. Start on the third day after chemotherapy. 07/21/22  Yes Timmy Maude SAUNDERS, MD  OVER THE COUNTER MEDICATION Take 1 tablet by mouth daily. Zyflamend otc  herbal supplement   Yes [provider]  pantoprazole  (PROTONIX ) 40 MG tablet TAKE 1 TABLET DAILY 02/17/23  Yes Wallace Search A, PA  potassium chloride  SA (KLOR-CON  M) 20 MEQ tablet Take 1 tablet (20 mEq total) by mouth daily. 01/15/23  Yes Conte, Tessa N, PA-C  prochlorperazine  (COMPAZINE ) 10 MG tablet Take 1 tablet (10 mg total) by mouth every 6 (six) hours as needed for nausea or vomiting. 07/21/22  Yes Ennever, Maude SAUNDERS, MD  thyroid  (ARMOUR THYROID ) 90 MG tablet Take 1 tablet (90 mg total) by mouth daily. 08/07/23  Yes Clapp, Kara F, PA-C  traMADol  (ULTRAM ) 50 MG tablet Take 1 tablet (50 mg total) by mouth  every 6 (six) hours as needed (post op pain). 07/02/23  Yes Timmy Maude SAUNDERS, MD    Scheduled Meds:  atorvastatin   40 mg Oral QPM   B-complex with vitamin C  1 tablet Oral Daily   calcium -vitamin D   1 tablet Oral Q breakfast   cholecalciferol   2,000 Units Oral Daily   digoxin   0.125 mg Oral Daily   donepezil   5 mg Oral QHS   dronabinol   5 mg Oral BID AC   feeding supplement  237 mL Oral TID BM   fluticasone   1 spray Each Nare BID   fluticasone  furoate-vilanterol  1 puff Inhalation Daily   folic acid   500 mcg Oral Daily   furosemide   20 mg Oral BID   gabapentin   400 mg Oral BID   latanoprost   1 drop Both Eyes BID   levETIRAcetam   500 mg Oral QHS   loratadine   10 mg Oral Daily   megestrol   400 mg Oral BID   memantine   10 mg Oral BID   metoprolol  tartrate  50 mg Oral BID   multivitamins with iron   1 tablet Oral Daily   pantoprazole   40 mg Oral Daily   phosphorus  500 mg Oral BID   thyroid   90 mg Oral Daily   Continuous Infusions:  cefTRIAXone  (ROCEPHIN )  IV 1 g (08/23/23 2145)   PRN Meds: acetaminophen  **OR** acetaminophen , ondansetron  **OR** ondansetron  (ZOFRAN ) IV, senna-docusate, traMADol   Allergies:    Allergies  Allergen Reactions   Combigan [Brimonidine Tartrate-Timolol ] Itching    Per patient made eyes red, sore, and sensitivity to light   Sulfa Antibiotics  Hives and Rash   Sulfamethoxazole-Trimethoprim Hives    Social History:   Social History   Socioeconomic History   Marital status: Married    Spouse name: Carlin   Number of children: 2   Years of education: Not on file   Highest education level: 12th grade  Occupational History   Occupation: Retired  Tobacco Use   Smoking status: Former    Current packs/day: 0.00    Average packs/day: 0.8 packs/day for 5.0 years (3.8 ttl pk-yrs)    Types: Cigarettes    Start date: 06/14/1961    Quit date: 06/15/1966    Years since quitting: 57.2    Passive exposure: Past   Smokeless tobacco: Never  Vaping Use   Vaping status: Never Used  Substance and Sexual Activity   Alcohol use: Not Currently    Comment: occasional wine   Drug use: No   Sexual activity: Not Currently    Birth control/protection: Post-menopausal  Other Topics Concern   Not on file  Social History Narrative   Retired. Lives with husband.    Right Handed   Drinks 1-2 cups caffeine daily   Social Drivers of Health   Financial Resource Strain: Low Risk  (07/30/2023)   Overall Financial Resource Strain (CARDIA)    Difficulty of Paying Living Expenses: Not hard at all  Food Insecurity: No Food Insecurity (08/21/2023)   Hunger Vital Sign    Worried About Running Out of Food in the Last Year: Never true    Ran Out of Food in the Last Year: Never true  Transportation Needs: No Transportation Needs (08/21/2023)   PRAPARE - Administrator, Civil Service (Medical): No    Lack of Transportation (Non-Medical): No  Physical Activity: Inactive (07/30/2023)   Exercise Vital Sign    Days of Exercise per Week: 0 days    Minutes  of Exercise per Session: 40 min  Stress: No Stress Concern Present (07/30/2023)   [employer] of Occupational Health - Occupational Stress Questionnaire    Feeling of Stress : Only a little  Social Connections: Moderately Integrated (08/21/2023)   Social Connection and Isolation Panel     Frequency of Communication with Friends and Family: Once a week    Frequency of Social Gatherings with Friends and Family: Once a week    Attends Religious Services: More than 4 times per year    Active Member of [employer] or Organizations: No    Attends Engineer, structural: More than 4 times per year    Marital Status: Married  Catering manager Violence: Not At Risk (08/21/2023)   Humiliation, Afraid, Rape, and Kick questionnaire    Fear of Current or Ex-Partner: No    Emotionally Abused: No    Physically Abused: No    Sexually Abused: No    Family History:    Family History  Problem Relation Age of Onset   Stroke Mother    Hypertension Mother    Hyperlipidemia Mother    Stroke Father    Transient ischemic attack Father    Hyperlipidemia Father    Hypertension Father    Atrial fibrillation Son    Heart attack Neg Hx      ROS:  Please see the history of present illness.   All other ROS reviewed and negative.     Physical Exam/Data: Vitals:   08/23/23 1426 08/23/23 1431 08/23/23 2052 08/24/23 0531  BP: (!) 140/87 (!) 117/102 119/67 107/65  Pulse:   (!) 102 76  Resp:   18 18  Temp:   99 F (37.2 C) 98 F (36.7 C)  TempSrc:   Oral Oral  SpO2:   90% 94%  Weight:      Height:       No intake or output data in the 24 hours ending 08/24/23 1103    08/22/2023    7:05 AM 08/21/2023    9:31 AM 08/13/2023    9:47 AM  Last 3 Weights  Weight (lbs) 119 lb 4.3 oz 120 lb 5.9 oz 119 lb 14.4 oz  Weight (kg) 54.1 kg 54.6 kg 54.386 kg     Body mass index is 23.29 kg/m.  General: Malnourished, in no acute distress, on interview patient was confused and had difficulty answering questions HEENT: normal Neck: no JVD Vascular: No carotid bruits; Distal pulses 2+ bilaterally Cardiac:  normal S1, S2; RRR; no murmur  Lungs: Diffuse wheezing heard throughout the lungs Abd: soft, nontender, no hepatomegaly  Ext: no edema Musculoskeletal:  No deformities. Skin: warm and dry   Neuro:   no focal abnormalities noted Psych:  Normal affect   Patient was not on telemetry and had no recent EKGs done  Relevant CV Studies: Echocardiogram on 08/23/2023 IMPRESSIONS     1. Left ventricular ejection fraction, by estimation, is 60 to 65%. The  left ventricle has normal function. The left ventricle has no regional  wall motion abnormalities. Left ventricular diastolic function could not  be evaluated.   2. Right ventricular systolic function is normal. The right ventricular  size is normal. There is normal pulmonary artery systolic pressure. The  estimated right ventricular systolic pressure is 35.5 mmHg.   3. Massive biatrial dilation, suspect secondary to underlying afib but  could consider workup for infiltrative cardiomyopathy. Left atrial size  was severely dilated.   4. Right atrial size was severely dilated.  5. Moderate pleural effusion.   6. The mitral valve is normal in structure. Mild mitral valve  regurgitation. No evidence of mitral stenosis.   7. The aortic valve is tricuspid. There is mild calcification of the  aortic valve. Aortic valve regurgitation is moderate. Aortic valve  sclerosis/calcification is present, without any evidence of aortic  stenosis.   8. The inferior vena cava is normal in size with greater than 50%  respiratory variability, suggesting right atrial pressure of 3 mmHg.    Laboratory Data: High Sensitivity Troponin:  No results for input(s): TROPONINIHS in the last 720 hours.   Chemistry Recent Labs  Lab 08/20/23 2328 08/22/23 0716 08/22/23 1038 08/23/23 0753  NA 136  --  140 137  K 3.7  --  3.7 3.4*  CL 104  --  112* 108  CO2 24  --  19* 19*  GLUCOSE 121*  --  126* 78  BUN 25*  --  22 16  CREATININE 0.78  --  0.69 0.59  CALCIUM  8.2*  --  7.9* 7.5*  MG 2.3 2.3  --   --   GFRNONAA >60  --  >60 >60  ANIONGAP 8  --  9 10    Recent Labs  Lab 08/20/23 2328 08/22/23 1038 08/23/23 0753  PROT 5.6* 5.6* 5.2*   ALBUMIN  2.8* 2.8* 2.6*  AST 50* 58* 54*  ALT 46* 50* 45*  ALKPHOS 68 74 63  BILITOT 0.9 0.9 0.9   Lipids No results for input(s): CHOL, TRIG, HDL, LABVLDL, LDLCALC, CHOLHDL in the last 168 hours.  Hematology Recent Labs  Lab 08/21/23 1552 08/22/23 1038 08/23/23 0753  WBC 4.3 5.2 4.5  RBC 3.62* 4.00 3.64*  HGB 12.1 13.4 12.2  HCT 36.1 39.3 35.8*  MCV 99.7 98.3 98.4  MCH 33.4 33.5 33.5  MCHC 33.5 34.1 34.1  RDW 19.9* 19.8* 19.6*  PLT 51* 57* 60*   Thyroid   Recent Labs  Lab 08/22/23 0716  TSH 5.923*    BNPNo results for input(s): BNP, PROBNP in the last 168 hours.  DDimer No results for input(s): DDIMER in the last 168 hours.  Radiology/Studies:  DG Chest 1 View Result Date: 08/23/2023 CLINICAL DATA:  Post thoracentesis on the left EXAM: CHEST  1 VIEW COMPARISON:  08/20/2023 FINDINGS: Decreasing left pleural effusion following thoracentesis. No pneumothorax. Small residual left pleural effusion. Heart is mildly enlarged. Aortic atherosclerosis. Vascular congestion. No confluent opacity or effusion on the right. Right Port-A-Cath is unchanged. IMPRESSION: Decreasing left pleural effusion following thoracentesis. No pneumothorax. Electronically Signed   By: Franky Crease M.D.   On: 08/23/2023 15:25   US  THORACENTESIS ASP PLEURAL SPACE W/IMG GUIDE Result Date: 08/23/2023 INDICATION: Patient with history of metastatic breast cancer, recurrent left pleural effusion. Request received for diagnostic and therapeutic left thoracentesis. EXAM: ULTRASOUND GUIDED DIAGNOSTIC AND THERAPEUTIC LEFT THORACENTESIS MEDICATIONS: 8 mL 1% lidocaine  COMPLICATIONS: None immediate. PROCEDURE: An ultrasound guided thoracentesis was thoroughly discussed with the patient/spouse and questions answered. The benefits, risks, alternatives and complications were also discussed. The patient/spouse understands and wishes to proceed with the procedure. Written consent was obtained. Ultrasound was  performed to localize and mark an adequate pocket of fluid in the left chest. The area was then prepped and draped in the normal sterile fashion. 1% Lidocaine  was used for local anesthesia. Under ultrasound guidance a 6 Fr Safe-T-Centesis catheter was introduced. Thoracentesis was performed. The catheter was removed and a dressing applied. FINDINGS: A total of approximately 720 cc of yellow  fluid was removed. Samples were sent to the laboratory as requested by the clinical team. IMPRESSION: Successful ultrasound guided diagnostic and therapeutic left thoracentesis yielding 720 cc of pleural fluid. Performed by: Franky Kelsie RIGGERS Electronically Signed   By: Marcey Moan M.D.   On: 08/23/2023 15:20   ECHOCARDIOGRAM COMPLETE Result Date: 08/23/2023    ECHOCARDIOGRAM REPORT   Patient Name:   MARTINIQUE PIZZIMENTI Date of Exam: 08/23/2023 Medical Rec #:  [id number]   Height:       60.0 in Accession #:    [id number]  Weight:       119.3 lb Date of Birth:  01-01-1941   BSA:          1.498 m Patient Age:    82 years    BP:           129/77 mmHg Patient Gender: F           HR:           112 bpm. Exam Location:  Inpatient Procedure: 2D Echo, Cardiac Doppler and Color Doppler (Both Spectral and Color            Flow Doppler were utilized during procedure). Indications:    Dyspnea  History:        Patient has prior history of Echocardiogram examinations, most                 recent 03/26/2023. Risk Factors:Hypertension.  Sonographer:    Benard Stallion Referring Phys: [id number] PETER R ENNEVER IMPRESSIONS  1. Left ventricular ejection fraction, by estimation, is 60 to 65%. The left ventricle has normal function. The left ventricle has no regional wall motion abnormalities. Left ventricular diastolic function could not be evaluated.  2. Right ventricular systolic function is normal. The right ventricular size is normal. There is normal pulmonary artery systolic pressure. The estimated right ventricular systolic pressure is 35.5 mmHg.  3.  Massive biatrial dilation, suspect secondary to underlying afib but could consider workup for infiltrative cardiomyopathy. Left atrial size was severely dilated.  4. Right atrial size was severely dilated.  5. Moderate pleural effusion.  6. The mitral valve is normal in structure. Mild mitral valve regurgitation. No evidence of mitral stenosis.  7. The aortic valve is tricuspid. There is mild calcification of the aortic valve. Aortic valve regurgitation is moderate. Aortic valve sclerosis/calcification is present, without any evidence of aortic stenosis.  8. The inferior vena cava is normal in size with greater than 50% respiratory variability, suggesting right atrial pressure of 3 mmHg. FINDINGS  Left Ventricle: Left ventricular ejection fraction, by estimation, is 60 to 65%. The left ventricle has normal function. The left ventricle has no regional wall motion abnormalities. The left ventricular internal cavity size was normal in size. There is  no left ventricular hypertrophy. Left ventricular diastolic function could not be evaluated due to atrial fibrillation. Left ventricular diastolic function could not be evaluated. Right Ventricle: The right ventricular size is normal. No increase in right ventricular wall thickness. Right ventricular systolic function is normal. There is normal pulmonary artery systolic pressure. The tricuspid regurgitant velocity is 2.85 m/s, and  with an assumed right atrial pressure of 3 mmHg, the estimated right ventricular systolic pressure is 35.5 mmHg. Left Atrium: Massive biatrial dilation, suspect secondary to underlying afib but could consider workup for infiltrative cardiomyopathy. Left atrial size was severely dilated. Right Atrium: Right atrial size was severely dilated. Pericardium: There is no evidence of pericardial effusion. Mitral Valve: The mitral valve  is normal in structure. Mild mitral valve regurgitation. No evidence of mitral valve stenosis. Tricuspid Valve: The  tricuspid valve is normal in structure. Tricuspid valve regurgitation is mild . No evidence of tricuspid stenosis. Aortic Valve: The aortic valve is tricuspid. There is mild calcification of the aortic valve. Aortic valve regurgitation is moderate. Aortic regurgitation PHT measures 376 msec. Aortic valve sclerosis/calcification is present, without any evidence of aortic stenosis. Aortic valve mean gradient measures 5.0 mmHg. Aortic valve peak gradient measures 9.0 mmHg. Aortic valve area, by VTI measures 2.12 cm. Pulmonic Valve: The pulmonic valve was normal in structure. Pulmonic valve regurgitation is not visualized. No evidence of pulmonic stenosis. Aorta: The aortic root is normal in size and structure. Venous: The inferior vena cava is normal in size with greater than 50% respiratory variability, suggesting right atrial pressure of 3 mmHg. IAS/Shunts: There is right bowing of the interatrial septum, suggestive of elevated left atrial pressure. No atrial level shunt detected by color flow Doppler. Additional Comments: There is a moderate pleural effusion.  LEFT VENTRICLE PLAX 2D LVIDd:         3.70 cm   Diastology LVIDs:         2.40 cm   LV e' medial:    8.92 cm/s LV PW:         0.85 cm   LV E/e' medial:  14.3 LV IVS:        0.90 cm   LV e' lateral:   10.40 cm/s LVOT diam:     2.20 cm   LV E/e' lateral: 12.3 LV SV:         46 LV SV Index:   31 LVOT Area:     3.80 cm  RIGHT VENTRICLE RV Basal diam:  2.70 cm RV Mid diam:    2.10 cm RV S prime:     11.20 cm/s TAPSE (M-mode): 2.0 cm LEFT ATRIUM              Index        RIGHT ATRIUM           Index LA diam:        5.20 cm  3.47 cm/m   RA Area:     25.40 cm LA Vol (A2C):   146.0 ml 97.43 ml/m  RA Volume:   80.20 ml  53.52 ml/m LA Vol (A4C):   141.0 ml 94.10 ml/m LA Biplane Vol: 145.0 ml 96.77 ml/m  AORTIC VALVE AV Area (Vmax):    2.00 cm AV Area (Vmean):   1.82 cm AV Area (VTI):     2.12 cm AV Vmax:           150.00 cm/s AV Vmean:          102.000 cm/s AV  VTI:            0.217 m AV Peak Grad:      9.0 mmHg AV Mean Grad:      5.0 mmHg LVOT Vmax:         78.80 cm/s LVOT Vmean:        48.800 cm/s LVOT VTI:          0.121 m LVOT/AV VTI ratio: 0.56 AI PHT:            376 msec  AORTA Ao Root diam: 3.30 cm Ao Asc diam:  3.50 cm MITRAL VALVE                TRICUSPID VALVE MV Area (  PHT): 4.63 cm     TR Peak grad:   32.5 mmHg MV Decel Time: 164 msec     TR Vmax:        285.00 cm/s MR Peak grad: 90.2 mmHg MR Vmax:      475.00 cm/s   SHUNTS MV E velocity: 128.00 cm/s  Systemic VTI:  0.12 m MV A velocity: 50.50 cm/s   Systemic Diam: 2.20 cm MV E/A ratio:  2.53 Morene Brownie Electronically signed by Morene Brownie Signature Date/Time: 08/23/2023/10:31:27 AM    Final    CT CHEST ABDOMEN PELVIS W CONTRAST Result Date: 08/22/2023 CLINICAL DATA:  Invasive breast cancer, stage IV. Assess treatment response. EXAM: CT CHEST, ABDOMEN, AND PELVIS WITH CONTRAST TECHNIQUE: Multidetector CT imaging of the chest, abdomen and pelvis was performed following the standard protocol during bolus administration of intravenous contrast. RADIATION DOSE REDUCTION: This exam was performed according to the departmental dose-optimization program which includes automated exposure control, adjustment of the mA and/or kV according to patient size and/or use of iterative reconstruction technique. CONTRAST:  OMNIPAQUE  IOHEXOL  300 MG/ML  SOLN COMPARISON:  05/04/2023, 08/13/2022. FINDINGS: CT CHEST FINDINGS Cardiovascular: The heart is enlarged and there is a small pericardial effusion. The distal tip of a chest port terminates in the superior vena cava. There is atherosclerotic calcification of the aorta without evidence of aneurysm. The pulmonary trunk is normal in caliber. Mediastinum/Nodes: No mediastinal, hilar, or axillary lymphadenopathy is seen. Surgical clips are noted in the axilla on the left. The trachea and esophagus are within normal limits. There is a small hiatal hernia. Lungs/Pleura:  There is a moderate loculated pleural effusion on the left. A small pleural effusion is noted on the right. Centrilobular emphysematous changes are present in the lungs. Atelectasis is noted bilaterally. No pneumothorax. Treatment related changes are noted in the left lung. No discrete pulmonary nodule or mass is seen. Musculoskeletal: Degenerative changes are present in the thoracic spine. A few scattered sclerotic lesions are noted in the thoracic spine and ribs, slightly increased from 2024. No pathologic fracture is seen. CT ABDOMEN PELVIS FINDINGS Hepatobiliary: No focal liver abnormality is seen. No gallstones, gallbladder wall thickening, or biliary dilatation. Pancreas: Unremarkable. No pancreatic ductal dilatation or surrounding inflammatory changes. Spleen: Normal in size without focal abnormality. 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 The right adrenal gland is within normal limits. The kidneys enhance symmetrically. No renal calculus or hydronephrosis bilaterally. The bladder is unremarkable. Stomach/Bowel: There is a small hiatal hernia. The stomach is otherwise within normal limits. No bowel obstruction, free air, or pneumatosis is seen. Scattered diverticular present along the colon without evidence of diverticulitis. Appendix appears normal. Vascular/Lymphatic: Aortic atherosclerosis. No abdominal or pelvic lymphadenopathy is seen by size criteria. Reproductive: Uterus and bilateral adnexa are unremarkable. Other: Moderate ascites is noted in all 4 quadrants. There is mild anasarca. Musculoskeletal: Fixation hardware is noted in the proximal left femur. Scattered sclerotic lesions are seen in the bones, slightly increased from 2024. There degenerative changes in the lumbar spine. There is a compression deformity at the L5 vertebral body, unchanged from 2024. IMPRESSION: 1. Scattered sclerotic lesions in the in the bones, slightly increased from  2024. No new pathologic fracture is seen. 2. Moderate loculated left pleural effusion and small right pleural effusion with scattered atelectasis. 3. Emphysema. 4. Moderate ascites and anasarca. 5. Diverticulosis without diverticulitis. 6. Aortic atherosclerosis. Electronically Signed   By: Leita Waddell HERO.D.  On: 08/22/2023 15:35   DG HIP UNILAT WITH PELVIS 2-3 VIEWS RIGHT Result Date: 08/22/2023 CLINICAL DATA:  Right hip pain. History of metastatic breast cancer. EXAM: DG HIP (WITH OR WITHOUT PELVIS) 2-3V RIGHT COMPARISON:  PET CT 05/04/2023 reviewed FINDINGS: Sclerotic lesions in the pelvis on prior PET are not well demonstrated by radiograph. There is no obvious lytic lesion or bony destruction. No evidence of acute fracture. The hip joint spaces preserved. Pubic rami are intact. Right pelvic phleboliths. Left femoral intramedullary nail with trans trochanteric screw fixation is partially included in the field of view. IMPRESSION: Sclerotic lesions in the pelvis on prior PET are not well demonstrated by radiograph. No obvious lytic lesion or bony destruction. If there is clinical concern for metastatic disease, recommend MRI for further assessment. Electronically Signed   By: Andrea Gasman M.D.   On: 08/22/2023 13:57   CT Head Wo Contrast Result Date: 08/20/2023 CLINICAL DATA:  Mental status change, unknown cause EXAM: CT HEAD WITHOUT CONTRAST TECHNIQUE: Contiguous axial images were obtained from the base of the skull through the vertex without intravenous contrast. RADIATION DOSE REDUCTION: This exam was performed according to the departmental dose-optimization program which includes automated exposure control, adjustment of the mA and/or kV according to patient size and/or use of iterative reconstruction technique. COMPARISON:  04/29/2015 FINDINGS: Brain: Normal anatomic configuration. Progressive parenchymal volume loss is present, with global parenchymal volume commensurate with the patient's age.  Mild periventricular white matter changes are present likely reflecting the sequela of small vessel ischemia. No abnormal intra or extra-axial mass lesion or fluid collection. No abnormal mass effect or midline shift. No evidence of acute intracranial hemorrhage or infarct. Ventricular size is normal. Cerebellum unremarkable. Vascular: No asymmetric hyperdense vasculature at the skull base. Skull: No acute fracture. Interval development of multiple sclerotic foci within the calvarium compatible with sclerotic metastatic disease. Sinuses/Orbits: Paranasal sinuses are clear. Ocular lenses have been removed. Orbits are otherwise unremarkable. Other: Mastoid air cells and middle ear cavities are clear. IMPRESSION: 1. No evidence of acute intracranial abnormality. 2. Progressive global parenchymal volume loss. 3. Interval development of multiple sclerotic foci within the calvarium compatible with sclerotic metastatic disease. Electronically Signed   By: Dorethia Molt M.D.   On: 08/20/2023 23:48   DG Chest Port 1 View Result Date: 08/20/2023 CLINICAL DATA:  Generalized weakness, breast cancer EXAM: PORTABLE CHEST 1 VIEW COMPARISON:  05/10/2023 FINDINGS: Moderate left pleural effusion has developed with compressive atelectasis of the left lung base. Right lung is clear. No pneumothorax. No pleural effusion on the right. Right internal jugular chest port tip seen within the superior vena cava. Cardiac size is stable. Pulmonary vascularity is normal. Surgical clips are seen within the left axilla. IMPRESSION: 1. Interval development of moderate left pleural effusion. Electronically Signed   By: Dorethia Molt M.D.   On: 08/20/2023 23:30     Assessment and Plan: Mariadelaluz Guggenheim is a 83 y.o. female with a hx of chronic diastolic congestive heart failure, atrial fibrillation on Eliquis , moderate to severe mitral regurgitation, moderate to severe tricuspid regurgitation, hypertension, prior CVA, lung cancer resection in  2003, metastatic breast cancer s/p lumpectomy, radiation, tamoxifen  and mild sleep apnea who is being seen 08/24/2023 for the evaluation of chronic diastolic heart failure at the request of Rai Ripudeep.  Metastatic breast cancer Failure to thrive Worsening memory Weakness Was hospitalized with weakness, worsening memory, and poor oral intake.  CT abdomen and pelvis showed sclerotic bone lesions, moderate ascites, moderate left pleural  effusion, mild right pleural effusion, aortic atherosclerosis, and emphysema. Labs showed decreased albumin  of 2.6.  Oncology feels like the weakness is likely from her cancer progressing.  Was seen by palliative care and made DNR DNI   Chronic diastolic heart failure Moderate to severe mitral regurgitation Moderate to severe tricuspid regurgitation Massive biatrial enlargement Hypokalemia Has a history of moderate to severe tricuspid and mitral regurgitation.  In 2022 was seen by Dr. Wonda and felt to be a poor candidate for transcatheter therapies because of the significant calcification on the posterior mitral leaflets.  Appears euvolemic on exam. Echo on 03/2023 showed severe biatrial dilation, moderate mitral regurgitation, moderate tricuspid regurgitation, mild aortic regurgitation, and mildly elevated pulmonary artery systolic pressure. Echocardiogram this hospitalization on 08/23/2023 showed normal LVEF of 60 to 65%, massive biatrial dilation, moderate pleural effusion, mild mitral regurgitation, and moderate aortic regurgitation. Chest x-ray on 08/23/2023 after thoracentesis showed small residual pleural effusion, vascular congestion, and aortic atherosclerosis With metastatic breast cancer, failure to thrive, and worsening memory, and recently changing status to DNR/DNI no further workup for atrial enlargement, tricuspid regurgitation, and mitral regurgitation recommended at this time. Continue Oral Lasix  20 mg daily. Order one dose of IV lasix  40 mg.  Will  order potassium replacement   Left pleural effusion Had thoracentesis performed on 08/23/2023 that seem to indicate transudative fluid. Will give a dose of IV lasix  as above.   Atrial fibrillation Prior CVA CHA2DS2-VASc Score = 8 [CHF History: 1, HTN History: 1, Diabetes History: 0, Stroke History: 2, Vascular Disease History: 1, Age Score: 2, Gender Score: 1].  Therefore, the patient's annual risk of stroke is 10.8 %.    Eliquis  was held due to decreased platelets.  Most recent platelet count on 08/23/2023 was 60. May continue to hold Eliquis  per primary.   Otherwise manage per primary     Risk Assessment/Risk Scores:     New York  Heart Association (NYHA) Functional Class NYHA Class III  CHA2DS2-VASc Score = 8  1} This indicates a 10.8% annual risk of stroke. The patient's score is based upon: CHF History: 1 HTN History: 1 Diabetes History: 0 Stroke History: 2 Vascular Disease History: 1 Age Score: 2 Gender Score: 1        For questions or updates, please contact [facility] Please consult [email or web address] for contact info under    Signed, Morse Clause, PA-C  08/24/2023 11:03 AM   I have seen and examined the patient along with Zane Adams, PA-C.  I have reviewed the chart, notes and new data.  I agree with PA/NP's note.  Key new complaints: Appears to be quite comfortable lying almost fully flat in bed, denies dyspnea or pain Key examination changes: Mildly reduced breath sounds left base, otherwise clear lung fields.  Irregular rhythm, rate controlled.  Holosystolic murmurs are heard at the left lower sternal border and at the apex.  Do not hear any diastolic murmurs. Key new findings / data:  I have reviewed the echocardiogram which is reportedly changed from the previous 1.  There is in fact a very little meaningful change.  She does have massively dilated atria, but this is just a change in the language used to describe the atria, which are unchanged from  the previous echo.  This can be seen with restrictive cardiomyopathy but is also very commonly seen with longstanding atrial fibrillation.  There may be a slight worsening of the aortic insufficiency, but this is not a hemodynamically significant change. Thoracentesis fluid  is relatively bland, with low cell count and low protein level (note that the patient is also hypoalbuminemic), with the exception of the LDH which is elevated at 110 (but serum LDH is 402).  BNP has not been checked during this admission, but has been elevated in the past.  PLAN: It is likely that the patient's recurrent pleural effusion is related to chronic diastolic heart failure, but cannot firmly exclude that her left-sided pleural effusion is related to malignancy rather than a bland hemodynamic cause. Additional workup is not indicated due to her other comorbid conditions.  Recommend increasing the dose of her loop diuretic and adding spironolactone to avoid the issues with hypokalemia.  [facility] will sign off.   Medication Recommendations: Furosemide  40 mg twice daily, spironolactone 25 mg daily.  Continue other cardiac medications unchanged Other recommendations (labs, testing, etc): Repeat basic metabolic panel daily while in house.  Repeat a basic metabolic panel in 3-4 days at nursing facility Follow up as an outpatient: Will make arrangements for an earlier office visit (at this point she was scheduled to see Dr. Shlomo back in September, will try to see if we can make arrangements for an office visit in about a month).   Azelyn Batie, MD, FACC [facility] [phone] 08/24/2023, 11:57 AM

## 2023-08-24 NOTE — Progress Notes (Signed)
 Overall, I think she is about the same.  I do think that her dementia is a problem.  I know she has had issues with dementia.  This may be worsening..  She did have ascites removed.  She had 720 cc of fluid removed.  From what I can tell, looks like it is transudative.  I am not sure we will have any cytology back for a while.  She is not eating much.  Again, I think that some of this is from her dementia.  Her husband has his health issues.  He has a hernia that may need to be fixed.  He really cannot take care of her at home.  I talked to him this morning.  I totally agree that trying to get her to a nursing home would not be a bad idea.  If we get her to [facility] that would be fantastic.  She is not having any problems with actual pain.  There are no labs back yet today.  She has had no obvious cough.  There is no obvious shortness of breath.  There is no nausea or vomiting.  It is hard to say if there is any diarrhea.  Again, I am not sure if she would be a candidate for any additional therapy for her breast cancer.  I know a PET scan is supposed to be done next week.  I think this can really help us  out.  She had an echocardiogram which shows that she still has a decent ejection fraction of 60-65%.  She does have large atria secondary to history of atrial fibrillation.  Vital signs are temperature of 98.  Pulse 76.  Blood pressure 107/65.  Her lungs sound relatively clear bilaterally.  Cardiac exam is irregular rate consistent with the atrial fibrillation.  The rate is fairly well-controlled.  Abdomen is slightly distended.  Abdomen is soft.  She has decent bowel sounds.  There is no fluid wave.  There is no palpable liver or spleen tip.  Extremities shows no clubbing, cyanosis or edema.  Neurological exam shows no focal neurological deficits.  She does have the cognitive changes.  Again, I think our goal is to try to get her to a nursing home.  Again, if we can get her to  [facility] that would be fantastic.  Again, I spoke to her husband today.  I totally agree with the DO NOT RESUSCITATE order.  I just want quality of life for her and for him.  I do appreciate the great care that she is getting from everybody on 6E.  Jeralyn Crease, MD  Inge 17:14

## 2023-08-24 NOTE — Plan of Care (Signed)
   Problem: Elimination: Goal: Will not experience complications related to bowel motility Outcome: Progressing   Problem: Safety: Goal: Ability to remain free from injury will improve Outcome: Progressing

## 2023-08-24 NOTE — Progress Notes (Signed)
   08/24/23 1613  TOC Brief Assessment  Insurance and Status Reviewed  Patient has primary care physician Yes Ozie, Saddie [place], [place])  Home environment has been reviewed From home  Prior level of function: modified independent assist  Prior/Current Home Services No current home services  Social Drivers of Health Review SDOH reviewed no interventions necessary  Transition of care needs transition of care needs identified, TOC will continue to follow   TOC following for disposition needs. CM at bedside for assessment husband not present.  High risk assessment to follow.

## 2023-08-24 NOTE — Progress Notes (Signed)
 PT Cancellation Note  Patient Details Name: Patricia [name] MRN: [id number] DOB: 29-Dec-1940   Cancelled Treatment:    Reason Eval/Treat Not Completed: Fatigue/lethargy limiting ability to participate  Patient states that she just does not feel like getting up. Will check back another time.  [name] PT Acute Rehabilitation Services Office [phone]  [name] Norris 08/24/2023, 1:46 PM

## 2023-08-24 NOTE — Progress Notes (Signed)
  Daily Progress Note   Patient Name: Elianah Karis       Date: 08/24/2023 DOB: 21-Feb-1941  Age: 83 y.o. MRN#: [id number] Attending Physician: Davia Nydia POUR, MD Primary Care Physician: Gayle Saddie JULIANNA DEVONNA Admit Date: 08/20/2023 Length of Stay: 2 days  Reason for Consultation/Follow-up: Establishing goals of care  Subjective:   Reviewed recent EMR including documentation from hospitalist and oncologist.  Reviewed recent CT imaging noting slight increase in sclerotic lesions related to cancer.  Patient also had echo performed today noting left ventricular EF 60-65%. Cariology also consulted, oncology following closely as well.   Presented to bedside to see patient.  Patient's husband also at bedside, patient in no distress, denies pain.     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 Oncology consulted for recommendations. Palliative medicine team consulted to assist with complex medical decision making.   Recommendations/Plan: # Complex medical decision making/goals of care:     -Patient unable to participate in complex medical decision-making due to underlying medical status.  Patient has underlying dementia.      Medical  oncology following closely.                 -  Code Status: Limited: Do not attempt resuscitation (DNR) -DNR-LIMITED -Do Not Intubate/DNI    # Symptom management Continue current pain and non-pain symptom management.    # Psycho-social/Spiritual Support:  - Support System: Husband, son, daughter   # Discharge Planning:  [facility]  Discussed with:  patient and patient's husband  Thank you for allowing the palliative care team to participate in the care Ronal Primrose.  Low MDM Lonia Serve MD.  Palliative Care Provider PMT # [phone]  If patient remains symptomatic despite maximum doses, please call PMT at [phone] between 0700 and 1900. Outside of these hours, please call attending, as PMT does not have night coverage.

## 2023-08-24 NOTE — Progress Notes (Addendum)
 [facility]                                                                              Patricia [name], is a 83 y.o. female, DOB - [birth year], FMW:[id number] Admit date - 08/20/2023    Outpatient Primary MD for the patient is Patricia Saddie FALCON, PA-C  LOS - 2  days  No chief complaint on file.      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  Per spouse, patient has had poor appetite and weight loss over the last few weeks.  She was seen by her oncologist 2 weeks ago and was started on Marinol  and Megace  to help with appetite.  Over the last few days, patient had been significantly weak and had difficulty getting in bed.  In ED, BP soft in 90s, hemoglobin 9.9, (12.0 on 08/13/2023), platelets 57 (124 on 08/13/2023)  UA positive for UTI    Assessment & Plan    Principal Problem:   Generalized weakness, FTT - In the setting of metastatic breast CA, stage IV presenting with generalized weakness, poor p.o. intake, worsening pancytopenia, UTI -Transfused 1 unit packed RBCs on 6/21, H&H stable -Continue Marinol , Megace , fall precautions - Palliative medicine, oncology following  Right hip pain in the setting of metastatic breast CA, metastasis - Continue pain control  - Right hip x-ray shows sclerotic lesions, no obvious lytic lesion or bony destruction.  CT chest abdomen pelvis on 6/22 showed scattered sclerotic lesions in the bones slightly increased from 2024, no pathologic fractures   Pancytopenia with anemia, thrombocytopenia -Hemoglobin 9.9 on admission, status post 1 unit packed RBCs - H&H stable, monitor counts   Metastatic breast cancer, Osseous metastases -  Continue gabapentin , Megace , Marinol , as needed  tramadol   -CT chest abdomen pelvis on 6/22 showed scattered sclerotic lesions in the bones slightly increased from 2024, no pathologic fractures, anasarca with moderate ascites, loculated moderate left pleural effusion - US  thoracentesis, likely malignant.  Currently no hypoxia or abdominal pain, hold off on paracentesis - Oncology, palliative medicine following, recommended DNR, SNF.  Outpatient PET scan.  Left moderate pleural effusion, also chronic pleural effusion -Likely secondary to breast malignancy - CT chest abdomen pelvis showed moderate loculated left pleural effusion and small right pleural effusion, emphysema, moderate ascites and anasarca - US  guided left thoracentesis on 6/23, 720 cc removed, follow studies   UTI, acute cystitis - immunocompromised state, - Continue IV Rocephin , follow urine culture and sensitivities - Blood cultures negative so far    Atrial fibrillation - HR stable, continue Lopressor  - Hold Eliquis  in the setting of thrombocytopenia    Chronic diastolic HF -  Continue on digoxin , Lasix  and Lopressor  -2D echo showed EF 60-65%, no regional WMA, left ventricular diastolic function could not be evaluated, RV SF, massive biatrial dilation suspect secondary to underlying A-fib but could consider workup for infiltrative cardiomyopathy, left atrial size severely dilated, moderate AR -  Cardiology onsulted  Hypophosphatemia - Continue replacement     HLD - Continue atorvastatin    Hypothyroidism - Continue Armour - TSH 5.9   Seizure disorder - Continue Keppra    GERD - Continue Protonix    Dementia - Continue Aricept  and memantine    Asthma - No wheezing, continue home bronchodilators   Estimated body mass index is 23.29 kg/m as calculated from the following:   Height as of this encounter: 5' (1.524 m).   Weight as of this encounter: 54.1 kg.  Code Status: DNR/DNI DVT Prophylaxis:  SCDs Start: 08/21/23 0809   Level of Care: Level of care:  Med-Surg Family Communication: Updated patient Disposition Plan:      Remains inpatient appropriate:     Procedures:  2D echo  Consultants:   Oncology Palliative medicine Cardiology  Antimicrobials:   Anti-infectives (From admission, onward)    Start     Dose/Rate Route Frequency Ordered Stop   08/21/23 2000  cefTRIAXone  (ROCEPHIN ) 1 g in sodium chloride  0.9 % 100 mL IVPB        1 g 200 mL/hr over 30 Minutes Intravenous Every 24 hours 08/21/23 1821            Medications  atorvastatin   40 mg Oral QPM   B-complex with vitamin C  1 tablet Oral Daily   calcium -vitamin D   1 tablet Oral Q breakfast   cholecalciferol   2,000 Units Oral Daily   digoxin   0.125 mg Oral Daily   donepezil   5 mg Oral QHS   dronabinol   5 mg Oral BID AC   feeding supplement  237 mL Oral TID BM   fluticasone   1 spray Each Nare BID   fluticasone  furoate-vilanterol  1 puff Inhalation Daily   folic acid   500 mcg Oral Daily   furosemide   40 mg Intravenous Once   [START ON 08/25/2023] furosemide   40 mg Oral BID   gabapentin   400 mg Oral BID   latanoprost   1 drop Both Eyes BID   levETIRAcetam   500 mg Oral QHS   loratadine   10 mg Oral Daily   megestrol   400 mg Oral BID   memantine   10 mg Oral BID   metoprolol  tartrate  50 mg Oral BID   multivitamins with iron   1 tablet Oral Daily   pantoprazole   40 mg Oral Daily   phosphorus  500 mg Oral BID   potassium chloride   40 mEq Oral BID   spironolactone  25 mg Oral Daily   thyroid   90 mg Oral Daily      Subjective:   Patricia [name] was seen and examined today.  Much more alert and oriented today, no acute complaints.    Objective:   Vitals:   08/23/23 1431 08/23/23 2052 08/24/23 0531 08/24/23 1305  BP: (!) 117/102 119/67 107/65 (!) 103/58  Pulse:  (!) 102 76 78  Resp:  18 18 18   Temp:  99 F (37.2 C) 98 F (36.7 C) 98.4 F (36.9 C)  TempSrc:  Oral Oral Oral  SpO2:  90% 94% 95%  Weight:      Height:       No intake or output data in the 24  hours ending 08/24/23 1321    Wt Readings from Last 3 Encounters:  08/22/23 54.1 kg  08/13/23 54.4 kg  08/03/23 54.9 kg   Physical Exam General: Alert and oriented x 3, NAD Cardiovascular: S1 S2 clear, RRR.  Respiratory: CTAB Gastrointestinal: Soft, nontender, nondistended, NBS Ext: no pedal edema  bilaterally Neuro: no new deficits Skin: No rashes Psych: flat affect   Data Reviewed:  I have personally reviewed following labs    CBC Lab Results  Component Value Date   WBC 4.5 08/23/2023   RBC 3.64 (L) 08/23/2023   HGB 12.2 08/23/2023   HCT 35.8 (L) 08/23/2023   MCV 98.4 08/23/2023   MCH 33.5 08/23/2023   PLT 60 (L) 08/23/2023   MCHC 34.1 08/23/2023   RDW 19.6 (H) 08/23/2023   LYMPHSABS 0.2 (L) 08/23/2023   MONOABS 0.4 08/23/2023   EOSABS 0.0 08/23/2023   BASOSABS 0.0 08/23/2023     Last metabolic panel Lab Results  Component Value Date   NA 140 08/24/2023   K 3.0 (L) 08/24/2023   CL 109 08/24/2023   CO2 22 08/24/2023   BUN 15 08/24/2023   CREATININE 0.70 08/24/2023   GLUCOSE 85 08/24/2023   GFRNONAA >60 08/24/2023   GFRAA 61 09/28/2019   CALCIUM  7.4 (L) 08/24/2023   PHOS 2.0 (L) 08/23/2023   PROT 5.2 (L) 08/24/2023   ALBUMIN  2.6 (L) 08/24/2023   LABGLOB 2.2 08/05/2023   AGRATIO 2.0 10/09/2021   BILITOT 0.9 08/24/2023   ALKPHOS 59 08/24/2023   AST 56 (H) 08/24/2023   ALT 43 08/24/2023   ANIONGAP 9 08/24/2023    CBG (last 3)  No results for input(s): GLUCAP in the last 72 hours.    Coagulation Profile: No results for input(s): INR, PROTIME in the last 168 hours.   Radiology Studies: I have personally reviewed the imaging studies  DG Chest 1 View Result Date: 08/23/2023 CLINICAL DATA:  Post thoracentesis on the left EXAM: CHEST  1 VIEW COMPARISON:  08/20/2023 FINDINGS: Decreasing left pleural effusion following thoracentesis. No pneumothorax. Small residual left pleural effusion. Heart is mildly enlarged. Aortic atherosclerosis. Vascular  congestion. No confluent opacity or effusion on the right. Right Port-A-Cath is unchanged. IMPRESSION: Decreasing left pleural effusion following thoracentesis. No pneumothorax. Electronically Signed   By: Franky Crease M.D.   On: 08/23/2023 15:25   US  THORACENTESIS ASP PLEURAL SPACE W/IMG GUIDE Result Date: 08/23/2023 INDICATION: Patient with history of metastatic breast cancer, recurrent left pleural effusion. Request received for diagnostic and therapeutic left thoracentesis. EXAM: ULTRASOUND GUIDED DIAGNOSTIC AND THERAPEUTIC LEFT THORACENTESIS MEDICATIONS: 8 mL 1% lidocaine  COMPLICATIONS: None immediate. PROCEDURE: An ultrasound guided thoracentesis was thoroughly discussed with the patient/spouse and questions answered. The benefits, risks, alternatives and complications were also discussed. The patient/spouse understands and wishes to proceed with the procedure. Written consent was obtained. Ultrasound was performed to localize and mark an adequate pocket of fluid in the left chest. The area was then prepped and draped in the normal sterile fashion. 1% Lidocaine  was used for local anesthesia. Under ultrasound guidance a 6 Fr Safe-T-Centesis catheter was introduced. Thoracentesis was performed. The catheter was removed and a dressing applied. FINDINGS: A total of approximately 720 cc of yellow fluid was removed. Samples were sent to the laboratory as requested by the clinical team. IMPRESSION: Successful ultrasound guided diagnostic and therapeutic left thoracentesis yielding 720 cc of pleural fluid. Performed by: Franky Kelsie RIGGERS Electronically Signed   By: Marcey Moan M.D.   On: 08/23/2023 15:20   ECHOCARDIOGRAM COMPLETE Result Date: 08/23/2023    ECHOCARDIOGRAM REPORT   Patient Name:   Patricia [name] Date of Exam: 08/23/2023 Medical Rec #:  [id number]   Height:       60.0 in Accession #:    [id number]  Weight:       119.3  lb Date of Birth:  06/24/1940   BSA:          1.498 m Patient Age:    82 years     BP:           129/77 mmHg Patient Gender: F           HR:           112 bpm. Exam Location:  Inpatient Procedure: 2D Echo, Cardiac Doppler and Color Doppler (Both Spectral and Color            Flow Doppler were utilized during procedure). Indications:    Dyspnea  History:        Patient has prior history of Echocardiogram examinations, most                 recent 03/26/2023. Risk Factors:Hypertension.  Sonographer:    Benard Stallion Referring Phys: [id number] PETER R ENNEVER IMPRESSIONS  1. Left ventricular ejection fraction, by estimation, is 60 to 65%. The left ventricle has normal function. The left ventricle has no regional wall motion abnormalities. Left ventricular diastolic function could not be evaluated.  2. Right ventricular systolic function is normal. The right ventricular size is normal. There is normal pulmonary artery systolic pressure. The estimated right ventricular systolic pressure is 35.5 mmHg.  3. Massive biatrial dilation, suspect secondary to underlying afib but could consider workup for infiltrative cardiomyopathy. Left atrial size was severely dilated.  4. Right atrial size was severely dilated.  5. Moderate pleural effusion.  6. The mitral valve is normal in structure. Mild mitral valve regurgitation. No evidence of mitral stenosis.  7. The aortic valve is tricuspid. There is mild calcification of the aortic valve. Aortic valve regurgitation is moderate. Aortic valve sclerosis/calcification is present, without any evidence of aortic stenosis.  8. The inferior vena cava is normal in size with greater than 50% respiratory variability, suggesting right atrial pressure of 3 mmHg. FINDINGS  Left Ventricle: Left ventricular ejection fraction, by estimation, is 60 to 65%. The left ventricle has normal function. The left ventricle has no regional wall motion abnormalities. The left ventricular internal cavity size was normal in size. There is  no left ventricular hypertrophy. Left ventricular diastolic  function could not be evaluated due to atrial fibrillation. Left ventricular diastolic function could not be evaluated. Right Ventricle: The right ventricular size is normal. No increase in right ventricular wall thickness. Right ventricular systolic function is normal. There is normal pulmonary artery systolic pressure. The tricuspid regurgitant velocity is 2.85 m/s, and  with an assumed right atrial pressure of 3 mmHg, the estimated right ventricular systolic pressure is 35.5 mmHg. Left Atrium: Massive biatrial dilation, suspect secondary to underlying afib but could consider workup for infiltrative cardiomyopathy. Left atrial size was severely dilated. Right Atrium: Right atrial size was severely dilated. Pericardium: There is no evidence of pericardial effusion. Mitral Valve: The mitral valve is normal in structure. Mild mitral valve regurgitation. No evidence of mitral valve stenosis. Tricuspid Valve: The tricuspid valve is normal in structure. Tricuspid valve regurgitation is mild . No evidence of tricuspid stenosis. Aortic Valve: The aortic valve is tricuspid. There is mild calcification of the aortic valve. Aortic valve regurgitation is moderate. Aortic regurgitation PHT measures 376 msec. Aortic valve sclerosis/calcification is present, without any evidence of aortic stenosis. Aortic valve mean gradient measures 5.0 mmHg. Aortic valve peak gradient measures 9.0 mmHg. Aortic valve area, by VTI measures 2.12 cm. Pulmonic Valve: The pulmonic valve was normal in structure.  Pulmonic valve regurgitation is not visualized. No evidence of pulmonic stenosis. Aorta: The aortic root is normal in size and structure. Venous: The inferior vena cava is normal in size with greater than 50% respiratory variability, suggesting right atrial pressure of 3 mmHg. IAS/Shunts: There is right bowing of the interatrial septum, suggestive of elevated left atrial pressure. No atrial level shunt detected by color flow Doppler.  Additional Comments: There is a moderate pleural effusion.  LEFT VENTRICLE PLAX 2D LVIDd:         3.70 cm   Diastology LVIDs:         2.40 cm   LV e' medial:    8.92 cm/s LV PW:         0.85 cm   LV E/e' medial:  14.3 LV IVS:        0.90 cm   LV e' lateral:   10.40 cm/s LVOT diam:     2.20 cm   LV E/e' lateral: 12.3 LV SV:         46 LV SV Index:   31 LVOT Area:     3.80 cm  RIGHT VENTRICLE RV Basal diam:  2.70 cm RV Mid diam:    2.10 cm RV S prime:     11.20 cm/s TAPSE (M-mode): 2.0 cm LEFT ATRIUM              Index        RIGHT ATRIUM           Index LA diam:        5.20 cm  3.47 cm/m   RA Area:     25.40 cm LA Vol (A2C):   146.0 ml 97.43 ml/m  RA Volume:   80.20 ml  53.52 ml/m LA Vol (A4C):   141.0 ml 94.10 ml/m LA Biplane Vol: 145.0 ml 96.77 ml/m  AORTIC VALVE AV Area (Vmax):    2.00 cm AV Area (Vmean):   1.82 cm AV Area (VTI):     2.12 cm AV Vmax:           150.00 cm/s AV Vmean:          102.000 cm/s AV VTI:            0.217 m AV Peak Grad:      9.0 mmHg AV Mean Grad:      5.0 mmHg LVOT Vmax:         78.80 cm/s LVOT Vmean:        48.800 cm/s LVOT VTI:          0.121 m LVOT/AV VTI ratio: 0.56 AI PHT:            376 msec  AORTA Ao Root diam: 3.30 cm Ao Asc diam:  3.50 cm MITRAL VALVE                TRICUSPID VALVE MV Area (PHT): 4.63 cm     TR Peak grad:   32.5 mmHg MV Decel Time: 164 msec     TR Vmax:        285.00 cm/s MR Peak grad: 90.2 mmHg MR Vmax:      475.00 cm/s   SHUNTS MV E velocity: 128.00 cm/s  Systemic VTI:  0.12 m MV A velocity: 50.50 cm/s   Systemic Diam: 2.20 cm MV E/A ratio:  2.53 Morene Brownie Electronically signed by Morene Brownie Signature Date/Time: 08/23/2023/10:31:27 AM    Final    CT CHEST ABDOMEN PELVIS W CONTRAST Result  Date: 08/22/2023 CLINICAL DATA:  Invasive breast cancer, stage IV. Assess treatment response. EXAM: CT CHEST, ABDOMEN, AND PELVIS WITH CONTRAST TECHNIQUE: Multidetector CT imaging of the chest, abdomen and pelvis was performed following the standard  protocol during bolus administration of intravenous contrast. RADIATION DOSE REDUCTION: This exam was performed according to the departmental dose-optimization program which includes automated exposure control, adjustment of the mA and/or kV according to patient size and/or use of iterative reconstruction technique. CONTRAST:  OMNIPAQUE  IOHEXOL  300 MG/ML  SOLN COMPARISON:  05/04/2023, 08/13/2022. FINDINGS: CT CHEST FINDINGS Cardiovascular: The heart is enlarged and there is a small pericardial effusion. The distal tip of a chest port terminates in the superior vena cava. There is atherosclerotic calcification of the aorta without evidence of aneurysm. The pulmonary trunk is normal in caliber. Mediastinum/Nodes: No mediastinal, hilar, or axillary lymphadenopathy is seen. Surgical clips are noted in the axilla on the left. The trachea and esophagus are within normal limits. There is a small hiatal hernia. Lungs/Pleura: There is a moderate loculated pleural effusion on the left. A small pleural effusion is noted on the right. Centrilobular emphysematous changes are present in the lungs. Atelectasis is noted bilaterally. No pneumothorax. Treatment related changes are noted in the left lung. No discrete pulmonary nodule or mass is seen. Musculoskeletal: Degenerative changes are present in the thoracic spine. A few scattered sclerotic lesions are noted in the thoracic spine and ribs, slightly increased from 2024. No pathologic fracture is seen. CT ABDOMEN PELVIS FINDINGS Hepatobiliary: No focal liver abnormality is seen. No gallstones, gallbladder wall thickening, or biliary dilatation. Pancreas: Unremarkable. No pancreatic ductal dilatation or surrounding inflammatory changes. Spleen: Normal in size without focal abnormality. 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 The right adrenal gland is within normal limits. The kidneys enhance symmetrically. No renal  calculus or hydronephrosis bilaterally. The bladder is unremarkable. Stomach/Bowel: There is a small hiatal hernia. The stomach is otherwise within normal limits. No bowel obstruction, free air, or pneumatosis is seen. Scattered diverticular present along the colon without evidence of diverticulitis. Appendix appears normal. Vascular/Lymphatic: Aortic atherosclerosis. No abdominal or pelvic lymphadenopathy is seen by size criteria. Reproductive: Uterus and bilateral adnexa are unremarkable. Other: Moderate ascites is noted in all 4 quadrants. There is mild anasarca. Musculoskeletal: Fixation hardware is noted in the proximal left femur. Scattered sclerotic lesions are seen in the bones, slightly increased from 2024. There degenerative changes in the lumbar spine. There is a compression deformity at the L5 vertebral body, unchanged from 2024. IMPRESSION: 1. Scattered sclerotic lesions in the in the bones, slightly increased from 2024. No new pathologic fracture is seen. 2. Moderate loculated left pleural effusion and small right pleural effusion with scattered atelectasis. 3. Emphysema. 4. Moderate ascites and anasarca. 5. Diverticulosis without diverticulitis. 6. Aortic atherosclerosis. Electronically Signed   By: Leita Birmingham M.D.   On: 08/22/2023 15:35       Devone Bonilla M.D. [facility] 08/24/2023, 1:21 PM  Available via Epic secure chat 7am-7pm After 7 pm, please refer to night coverage provider listed on amion.

## 2023-08-25 DIAGNOSIS — R531 Weakness: Secondary | ICD-10-CM | POA: Diagnosis not present

## 2023-08-25 LAB — CBC WITH DIFFERENTIAL/PLATELET
Abs Immature Granulocytes: 0.03 10*3/uL (ref 0.00–0.07)
Basophils Absolute: 0 10*3/uL (ref 0.0–0.1)
Basophils Relative: 0 %
Eosinophils Absolute: 0 10*3/uL (ref 0.0–0.5)
Eosinophils Relative: 0 %
HCT: 34.8 % — ABNORMAL LOW (ref 36.0–46.0)
Hemoglobin: 11.7 g/dL — ABNORMAL LOW (ref 12.0–15.0)
Immature Granulocytes: 1 %
Lymphocytes Relative: 6 %
Lymphs Abs: 0.2 10*3/uL — ABNORMAL LOW (ref 0.7–4.0)
MCH: 33.4 pg (ref 26.0–34.0)
MCHC: 33.6 g/dL (ref 30.0–36.0)
MCV: 99.4 fL (ref 80.0–100.0)
Monocytes Absolute: 0.4 10*3/uL (ref 0.1–1.0)
Monocytes Relative: 10 %
Neutro Abs: 3.2 10*3/uL (ref 1.7–7.7)
Neutrophils Relative %: 83 %
Platelets: 98 10*3/uL — ABNORMAL LOW (ref 150–400)
RBC: 3.5 MIL/uL — ABNORMAL LOW (ref 3.87–5.11)
RDW: 19.5 % — ABNORMAL HIGH (ref 11.5–15.5)
WBC: 3.8 10*3/uL — ABNORMAL LOW (ref 4.0–10.5)
nRBC: 0 % (ref 0.0–0.2)

## 2023-08-25 LAB — COMPREHENSIVE METABOLIC PANEL WITH GFR
ALT: 44 U/L (ref 0–44)
AST: 55 U/L — ABNORMAL HIGH (ref 15–41)
Albumin: 2.5 g/dL — ABNORMAL LOW (ref 3.5–5.0)
Alkaline Phosphatase: 73 U/L (ref 38–126)
Anion gap: 8 (ref 5–15)
BUN: 20 mg/dL (ref 8–23)
CO2: 22 mmol/L (ref 22–32)
Calcium: 8.2 mg/dL — ABNORMAL LOW (ref 8.9–10.3)
Chloride: 107 mmol/L (ref 98–111)
Creatinine, Ser: 0.68 mg/dL (ref 0.44–1.00)
GFR, Estimated: >60 mL/min (ref >60)
Glucose, Bld: 126 mg/dL — ABNORMAL HIGH (ref 70–99)
Potassium: 3.9 mmol/L (ref 3.5–5.1)
Sodium: 137 mmol/L (ref 135–145)
Total Bilirubin: 0.7 mg/dL (ref 0.0–1.2)
Total Protein: 5.3 g/dL — ABNORMAL LOW (ref 6.5–8.1)

## 2023-08-25 LAB — GRAM STAIN

## 2023-08-25 LAB — CYTOLOGY - NON PAP

## 2023-08-25 NOTE — Progress Notes (Signed)
 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  Per spouse, patient has had poor appetite and weight loss over the last few weeks.  She was seen by her oncologist 2 weeks ago and was started on Marinol  and Megace  to help with appetite. Over the last few days, patient had been significantly weak and had difficulty getting in bed.  UA at intake concerning for UTI.  Hospitalist called for admission.  Patient admitted as above with worsening p.o. intake, weakness, ambulatory dysfunction and concern over failure to thrive complicated by what appears to be an acute UTI.  Clinically improving but given patient's profound ambulatory dysfunction and weakness discussion in regards to disposition with husband indicates she is not safe to return home as he cannot care for her in her current state.  Will discuss with care team and consultants in regards to patient's prognosis and possible discharge to either facility with hospice or at hospice house given her recent decline and advanced disease..     Assessment & Plan:   Principal Problem:   Generalized weakness Active Problems:   Hyperlipidemia   Essential hypertension   History of CVA (cerebrovascular accident)   Anemia associated with chemotherapy   Thrombocytopenia (HCC)   Breast cancer metastasized to bone, unspecified laterality (HCC)   UTI (urinary tract infection)   Pancytopenia (HCC)   DNR (do not resuscitate)   Dementia (HCC)   Weakness generalized   Counseling and coordination of care   Goals of care, counseling/discussion   Palliative care encounter   Metastatic malignant  neoplasm (HCC)   UTI, acute cystitis - Complicated by immunocompromised state, - 5-day antibiotic course completed 6/25 - Blood cultures negative so far, urine culture with multiple species  Generalized weakness, FTT - In the setting of metastatic breast CA, stage IV presenting with generalized weakness, poor p.o. intake, worsening pancytopenia, UTI -Transfused 1 unit packed RBCs on 6/21, H&H stable -Continue Marinol , Megace , fall precautions - Palliative medicine, oncology following   Goals of care  - Lengthy discussion today with patient and her husband, we discussed prognosis which is a new topic for them as they are unaware of any specific/estimated prognosis. - Given patient's worsening condition, failure to thrive, UTI we discussed patient's wishes in regards to further procedures, imaging or invasive testing.   - At this time I would not recommend any further invasive testing and would focus on patient's comfort and quality of life  - I appreciate oncology and palliative care assistance with this conversation.  Right hip pain in the setting of metastatic breast CA, metastasis - Continue pain control  - Right hip x-ray shows sclerotic lesions, no obvious lytic lesion or bony destruction.  CT chest abdomen pelvis on 6/22 showed scattered sclerotic lesions in the bones slightly increased from 2024, no pathologic fractures. Would not repeat imaging given limited options for intervention at this time.   Pancytopenia with mild anemia, thrombocytopenia - Hemoglobin 9.9 on admission and received 1 unit packed RBCs - currently >11 - Avoid further blood products without overt symptoms or <8 given risk vs benefit   Metastatic breast cancer, Osseous metastases -  Continue gabapentin , Megace , Marinol , as needed tramadol   -  CT chest abdomen pelvis on 6/22 showed scattered sclerotic lesions in the bones slightly increased from 2024, no pathologic fractures, anasarca with moderate ascites,  loculated moderate left pleural effusion - US  thoracentesis, likely malignant.  Currently no hypoxia or abdominal pain, hold off on paracentesis - Oncology, palliative medicine following, recommended DNR.   Left moderate pleural effusion, also chronic pleural effusion - Likely secondary to breast malignancy - CT chest abdomen pelvis showed moderate loculated left pleural effusion and small right pleural effusion, emphysema, moderate ascites and anasarca - US  guided left thoracentesis on 6/23, 720 cc removed   Atrial fibrillation, rate controlled - HR stable, continue Lopressor  - Hold Eliquis  in the setting of thrombocytopenia    Chronic diastolic HF -  Continue on digoxin , Lasix  and Lopressor  - Echo showed EF 60-65%, no regional WMA, left ventricular diastolic function could not be evaluated, RV SF, massive biatrial dilation suspect secondary to underlying A-fib but could consider workup for infiltrative cardiomyopathy, left atrial size severely dilated, moderate AR - Cardiology onsulted   Hypophosphatemia - Continue replacement HLD - Continue atorvastatin  Hypothyroidism - Continue Armour - TSH 5.9 Seizure disorder - Continue Keppra  GERD - Continue Protonix  Dementia - Continue Aricept  and memantine  Asthma - No wheezing, continue home bronchodilators  DVT prophylaxis: SCDs Start: 08/21/23 0809   Code Status:   Code Status: Limited: Do not attempt resuscitation (DNR) -DNR-LIMITED -Do Not Intubate/DNI  Family Communication: Husband at bedside Status is: Inpatient  Dispo: The patient is from: Home              Anticipated d/c is to: To be determined              Anticipated d/c date is: Imminent              Patient currently is medically stable for discharge  Consultants:  Oncology, palliative care  Procedures:  Thoracentesis  Antimicrobials:  Ceftriaxone  x 5 days, stop date 08/25/2023  Subjective: No acute issues or events reported overnight  Objective: Vitals:    08/24/23 1305 08/24/23 1938 08/25/23 0416 08/25/23 0525  BP: (!) 103/58 125/71 120/81   Pulse: 78 99 80   Resp: 18 18 18    Temp: 98.4 F (36.9 C) 98.6 F (37 C) 98.7 F (37.1 C)   TempSrc: Oral Oral Oral   SpO2: 95% 93% 93%   Weight:    56.1 kg  Height:        Intake/Output Summary (Last 24 hours) at 08/25/2023 0828 Last data filed at 08/24/2023 2216 Gross per 24 hour  Intake 381.29 ml  Output 151 ml  Net 230.29 ml   Filed Weights   08/21/23 0931 08/22/23 0705 08/25/23 0525  Weight: 54.6 kg 54.1 kg 56.1 kg    Examination:  General:  Pleasantly resting in bed, No acute distress. HEENT:  Normocephalic atraumatic.  Sclerae nonicteric, noninjected.  Extraocular movements intact bilaterally. Neck:  Without mass or deformity.  Trachea is midline. Lungs:  Clear to auscultate bilaterally without rhonchi, wheeze, or rales. Heart:  Regular rate and rhythm.  Without murmurs, rubs, or gallops. Abdomen:  Soft, nontender, mildly distended.  Without guarding or rebound. Extremities: Without cyanosis, clubbing, 1+ pitting edema, or obvious deformity. Skin:  Warm and dry, no erythema.  Data Reviewed: I have personally reviewed following labs and imaging studies  CBC: Recent Labs  Lab 08/21/23 0031 08/21/23 1552 08/22/23 1038 08/23/23 0753 08/24/23 1045 08/25/23 0500  WBC 4.2 4.3 5.2 4.5 3.7* 3.8*  NEUTROABS 3.5  --   --  3.9 3.1 3.2  HGB 9.9* 12.1 13.4 12.2 12.9 11.7*  HCT 30.5* 36.1 39.3 35.8* 40.0 34.8*  MCV 106.6* 99.7 98.3 98.4 103.4* 99.4  PLT 57* 51* 57* 60* 81* 98*   Basic Metabolic Panel: Recent Labs  Lab 08/20/23 2328 08/22/23 0716 08/22/23 1038 08/23/23 0746 08/23/23 0753 08/24/23 1045 08/25/23 0500  NA 136  --  140  --  137 140 137  K 3.7  --  3.7  --  3.4* 3.0* 3.9  CL 104  --  112*  --  108 109 107  CO2 24  --  19*  --  19* 22 22  GLUCOSE 121*  --  126*  --  78 85 126*  BUN 25*  --  22  --  16 15 20   CREATININE 0.78  --  0.69  --  0.59 0.70 0.68   CALCIUM  8.2*  --  7.9*  --  7.5* 7.4* 8.2*  MG 2.3 2.3  --   --   --   --   --   PHOS 2.3* 1.1*  --  2.0*  --   --   --    GFR: Estimated Creatinine Clearance: 42.5 mL/min (by C-G formula based on SCr of 0.68 mg/dL). Liver Function Tests: Recent Labs  Lab 08/20/23 2328 08/22/23 1038 08/23/23 0753 08/24/23 1045 08/25/23 0500  AST 50* 58* 54* 56* 55*  ALT 46* 50* 45* 43 44  ALKPHOS 68 74 63 59 73  BILITOT 0.9 0.9 0.9 0.9 0.7  PROT 5.6* 5.6* 5.2* 5.2* 5.3*  ALBUMIN  2.8* 2.8* 2.6* 2.6* 2.5*   Recent Labs  Lab 08/20/23 2328  LIPASE 40    Recent Results (from the past 240 hours)  Urine Culture (for pregnant, neutropenic or urologic patients or patients with an indwelling urinary catheter)     Status: Abnormal   Collection Time: 08/21/23  6:28 PM   Specimen: Urine, Clean Catch  Result Value Ref Range Status   Specimen Description   Final    URINE, CLEAN CATCH Performed at [facility], [address] [place], [place] [address]    Special Requests   Final    NONE Performed at [facility], [address] [place], [place] [address]    Culture MULTIPLE SPECIES PRESENT, SUGGEST RECOLLECTION (A)  Final   Report Status 08/24/2023 FINAL  Final  Culture, blood (Routine X 2) w Reflex to ID Panel     Status: None (Preliminary result)   Collection Time: 08/21/23  7:49 PM   Specimen: BLOOD RIGHT HAND  Result Value Ref Range Status   Specimen Description BLOOD RIGHT HAND  Final   Special Requests AEROBIC BOTTLE ONLY Blood Culture adequate volume  Final   Culture   Final    NO GROWTH 2 DAYS Performed at [facility], [address] [place], [place] [address]    Report Status PENDING  Incomplete  Culture, blood (Routine X 2) w Reflex to ID Panel     Status: None (Preliminary result)   Collection Time: 08/21/23  7:49 PM   Specimen: BLOOD RIGHT HAND  Result Value Ref Range Status   Specimen Description BLOOD RIGHT HAND  Final    Special Requests AEROBIC BOTTLE ONLY Blood Culture adequate volume  Final   Culture   Final    NO GROWTH 2 DAYS Performed at [facility], [address], [place], [place] [address]    Report Status PENDING  Incomplete  C Difficile Quick Screen w PCR reflex     Status: None   Collection Time: 08/22/23  3:57 AM   Specimen: STOOL  Result Value Ref Range Status   C Diff antigen NEGATIVE NEGATIVE Final   C Diff toxin NEGATIVE NEGATIVE Final   C Diff interpretation No C. difficile detected.  Final    Comment: Performed at [facility], [address], [place], [place] [address]  Gastrointestinal Panel by PCR , Stool     Status: None   Collection Time: 08/22/23  3:57 AM   Specimen: Stool  Result Value Ref Range Status   Campylobacter species NOT DETECTED NOT DETECTED Final   Plesimonas shigelloides NOT DETECTED NOT DETECTED Final   Salmonella species NOT DETECTED NOT DETECTED Final   Yersinia enterocolitica NOT DETECTED NOT DETECTED Final   Vibrio species NOT DETECTED NOT DETECTED Final   Vibrio cholerae NOT DETECTED NOT DETECTED Final   Enteroaggregative E coli (EAEC) NOT DETECTED NOT DETECTED Final   Enteropathogenic E coli (EPEC) NOT DETECTED NOT DETECTED Final   Enterotoxigenic E coli (ETEC) NOT DETECTED NOT DETECTED Final   Shiga like toxin producing E coli (STEC) NOT DETECTED NOT DETECTED Final   Shigella/Enteroinvasive E coli (EIEC) NOT DETECTED NOT DETECTED Final   Cryptosporidium NOT DETECTED NOT DETECTED Final   Cyclospora cayetanensis NOT DETECTED NOT DETECTED Final   Entamoeba histolytica NOT DETECTED NOT DETECTED Final   Giardia lamblia NOT DETECTED NOT DETECTED Final   Adenovirus F40/41 NOT DETECTED NOT DETECTED Final   Astrovirus NOT DETECTED NOT DETECTED Final   Norovirus GI/GII NOT DETECTED NOT DETECTED Final   Rotavirus A NOT DETECTED NOT DETECTED Final   Sapovirus (I, II, IV, and V) NOT DETECTED NOT DETECTED Final    Comment: Performed at  [facility], [address] [place], [place] [address]  Culture, body fluid w Gram Stain-bottle     Status: None (Preliminary result)   Collection Time: 08/23/23  2:32 PM   Specimen: Fluid  Result Value Ref Range Status   Specimen Description FLUID PLEURAL  Final   Special Requests BOTTLES DRAWN AEROBIC AND ANAEROBIC  Final   Culture   Final    NO GROWTH < 12 HOURS Performed at [facility], [address], [place], [place] [address]    Report Status PENDING  Incomplete  Gram stain     Status: None (Preliminary result)   Collection Time: 08/23/23  2:32 PM   Specimen: Fluid  Result Value Ref Range Status   Specimen Description FLUID PLEURAL  Final   Special Requests NONE  Final   Gram Stain   Final    WBC PRESENT,BOTH PMN AND MONONUCLEAR NO ORGANISMS SEEN CYTOSPIN SMEAR Performed at [facility], [address] [place], [place] [address]    Report Status PENDING  Incomplete    Radiology Studies: DG Chest 1 View Result Date: 08/23/2023 CLINICAL DATA:  Post thoracentesis on the left EXAM: CHEST  1 VIEW COMPARISON:  08/20/2023 FINDINGS: Decreasing left pleural effusion following thoracentesis. No pneumothorax. Small residual left pleural effusion. Heart is mildly enlarged. Aortic atherosclerosis. Vascular congestion. No confluent opacity or effusion on the right. Right Port-A-Cath is unchanged. IMPRESSION: Decreasing left pleural effusion following thoracentesis. No pneumothorax. Electronically Signed   By: Franky Crease M.D.   On: 08/23/2023 15:25   US  THORACENTESIS ASP PLEURAL SPACE W/IMG GUIDE Result Date: 08/23/2023 INDICATION: Patient with history of metastatic breast cancer, recurrent left pleural effusion. Request  received for diagnostic and therapeutic left thoracentesis. EXAM: ULTRASOUND GUIDED DIAGNOSTIC AND THERAPEUTIC LEFT THORACENTESIS MEDICATIONS: 8 mL 1% lidocaine  COMPLICATIONS: None immediate. PROCEDURE: An ultrasound guided thoracentesis was  thoroughly discussed with the patient/spouse and questions answered. The benefits, risks, alternatives and complications were also discussed. The patient/spouse understands and wishes to proceed with the procedure. Written consent was obtained. Ultrasound was performed to localize and mark an adequate pocket of fluid in the left chest. The area was then prepped and draped in the normal sterile fashion. 1% Lidocaine  was used for local anesthesia. Under ultrasound guidance a 6 Fr Safe-T-Centesis catheter was introduced. Thoracentesis was performed. The catheter was removed and a dressing applied. FINDINGS: A total of approximately 720 cc of yellow fluid was removed. Samples were sent to the laboratory as requested by the clinical team. IMPRESSION: Successful ultrasound guided diagnostic and therapeutic left thoracentesis yielding 720 cc of pleural fluid. Performed by: Franky Kelsie RIGGERS Electronically Signed   By: Marcey Moan M.D.   On: 08/23/2023 15:20   ECHOCARDIOGRAM COMPLETE Result Date: 08/23/2023    ECHOCARDIOGRAM REPORT   Patient Name:   ELEASHA CATALDO Date of Exam: 08/23/2023 Medical Rec #:  [id number]   Height:       60.0 in Accession #:    [id number]  Weight:       119.3 lb Date of Birth:  [birth year]   BSA:          1.498 m Patient Age:    82 years    BP:           129/77 mmHg Patient Gender: F           HR:           112 bpm. Exam Location:  Inpatient Procedure: 2D Echo, Cardiac Doppler and Color Doppler (Both Spectral and Color            Flow Doppler were utilized during procedure). Indications:    Dyspnea  History:        Patient has prior history of Echocardiogram examinations, most                 recent 03/26/2023. Risk Factors:Hypertension.  Sonographer:    Benard Stallion Referring Phys: [id number] PETER R ENNEVER IMPRESSIONS  1. Left ventricular ejection fraction, by estimation, is 60 to 65%. The left ventricle has normal function. The left ventricle has no regional wall motion abnormalities. Left  ventricular diastolic function could not be evaluated.  2. Right ventricular systolic function is normal. The right ventricular size is normal. There is normal pulmonary artery systolic pressure. The estimated right ventricular systolic pressure is 35.5 mmHg.  3. Massive biatrial dilation, suspect secondary to underlying afib but could consider workup for infiltrative cardiomyopathy. Left atrial size was severely dilated.  4. Right atrial size was severely dilated.  5. Moderate pleural effusion.  6. The mitral valve is normal in structure. Mild mitral valve regurgitation. No evidence of mitral stenosis.  7. The aortic valve is tricuspid. There is mild calcification of the aortic valve. Aortic valve regurgitation is moderate. Aortic valve sclerosis/calcification is present, without any evidence of aortic stenosis.  8. The inferior vena cava is normal in size with greater than 50% respiratory variability, suggesting right atrial pressure of 3 mmHg. FINDINGS  Left Ventricle: Left ventricular ejection fraction, by estimation, is 60 to 65%. The left ventricle has normal function. The left ventricle has no regional wall motion abnormalities. The left ventricular internal cavity size was normal in size.  There is  no left ventricular hypertrophy. Left ventricular diastolic function could not be evaluated due to atrial fibrillation. Left ventricular diastolic function could not be evaluated. Right Ventricle: The right ventricular size is normal. No increase in right ventricular wall thickness. Right ventricular systolic function is normal. There is normal pulmonary artery systolic pressure. The tricuspid regurgitant velocity is 2.85 m/s, and  with an assumed right atrial pressure of 3 mmHg, the estimated right ventricular systolic pressure is 35.5 mmHg. Left Atrium: Massive biatrial dilation, suspect secondary to underlying afib but could consider workup for infiltrative cardiomyopathy. Left atrial size was severely dilated.  Right Atrium: Right atrial size was severely dilated. Pericardium: There is no evidence of pericardial effusion. Mitral Valve: The mitral valve is normal in structure. Mild mitral valve regurgitation. No evidence of mitral valve stenosis. Tricuspid Valve: The tricuspid valve is normal in structure. Tricuspid valve regurgitation is mild . No evidence of tricuspid stenosis. Aortic Valve: The aortic valve is tricuspid. There is mild calcification of the aortic valve. Aortic valve regurgitation is moderate. Aortic regurgitation PHT measures 376 msec. Aortic valve sclerosis/calcification is present, without any evidence of aortic stenosis. Aortic valve mean gradient measures 5.0 mmHg. Aortic valve peak gradient measures 9.0 mmHg. Aortic valve area, by VTI measures 2.12 cm. Pulmonic Valve: The pulmonic valve was normal in structure. Pulmonic valve regurgitation is not visualized. No evidence of pulmonic stenosis. Aorta: The aortic root is normal in size and structure. Venous: The inferior vena cava is normal in size with greater than 50% respiratory variability, suggesting right atrial pressure of 3 mmHg. IAS/Shunts: There is right bowing of the interatrial septum, suggestive of elevated left atrial pressure. No atrial level shunt detected by color flow Doppler. Additional Comments: There is a moderate pleural effusion.  LEFT VENTRICLE PLAX 2D LVIDd:         3.70 cm   Diastology LVIDs:         2.40 cm   LV e' medial:    8.92 cm/s LV PW:         0.85 cm   LV E/e' medial:  14.3 LV IVS:        0.90 cm   LV e' lateral:   10.40 cm/s LVOT diam:     2.20 cm   LV E/e' lateral: 12.3 LV SV:         46 LV SV Index:   31 LVOT Area:     3.80 cm  RIGHT VENTRICLE RV Basal diam:  2.70 cm RV Mid diam:    2.10 cm RV S prime:     11.20 cm/s TAPSE (M-mode): 2.0 cm LEFT ATRIUM              Index        RIGHT ATRIUM           Index LA diam:        5.20 cm  3.47 cm/m   RA Area:     25.40 cm LA Vol (A2C):   146.0 ml 97.43 ml/m  RA Volume:    80.20 ml  53.52 ml/m LA Vol (A4C):   141.0 ml 94.10 ml/m LA Biplane Vol: 145.0 ml 96.77 ml/m  AORTIC VALVE AV Area (Vmax):    2.00 cm AV Area (Vmean):   1.82 cm AV Area (VTI):     2.12 cm AV Vmax:           150.00 cm/s AV Vmean:  102.000 cm/s AV VTI:            0.217 m AV Peak Grad:      9.0 mmHg AV Mean Grad:      5.0 mmHg LVOT Vmax:         78.80 cm/s LVOT Vmean:        48.800 cm/s LVOT VTI:          0.121 m LVOT/AV VTI ratio: 0.56 AI PHT:            376 msec  AORTA Ao Root diam: 3.30 cm Ao Asc diam:  3.50 cm MITRAL VALVE                TRICUSPID VALVE MV Area (PHT): 4.63 cm     TR Peak grad:   32.5 mmHg MV Decel Time: 164 msec     TR Vmax:        285.00 cm/s MR Peak grad: 90.2 mmHg MR Vmax:      475.00 cm/s   SHUNTS MV E velocity: 128.00 cm/s  Systemic VTI:  0.12 m MV A velocity: 50.50 cm/s   Systemic Diam: 2.20 cm MV E/A ratio:  2.53 Morene Brownie Electronically signed by Morene Brownie Signature Date/Time: 08/23/2023/10:31:27 AM    Final    Scheduled Meds:  atorvastatin   40 mg Oral QPM   B-complex with vitamin C  1 tablet Oral Daily   calcium -vitamin D   1 tablet Oral Q breakfast   Chlorhexidine  Gluconate Cloth  6 each Topical Daily   cholecalciferol   2,000 Units Oral Daily   digoxin   0.125 mg Oral Daily   donepezil   5 mg Oral QHS   dronabinol   5 mg Oral BID AC   feeding supplement  237 mL Oral TID BM   fluticasone   1 spray Each Nare BID   fluticasone  furoate-vilanterol  1 puff Inhalation Daily   folic acid   500 mcg Oral Daily   furosemide   40 mg Oral BID   gabapentin   400 mg Oral BID   latanoprost   1 drop Both Eyes BID   levETIRAcetam   500 mg Oral QHS   loratadine   10 mg Oral Daily   megestrol   400 mg Oral BID   memantine   10 mg Oral BID   metoprolol  tartrate  50 mg Oral BID   multivitamins with iron   1 tablet Oral Daily   pantoprazole   40 mg Oral Daily   sodium chloride  flush  10-40 mL Intracatheter Q12H   spironolactone  25 mg Oral Daily   thyroid   90 mg Oral Daily    Continuous Infusions:  cefTRIAXone  (ROCEPHIN )  IV 1 g (08/24/23 2100)     LOS: 3 days   Time spent:  Elsie JAYSON [place], DO [facility]  If 7PM-7AM, please contact night-coverage [email or web address]  08/25/2023, 8:28 AM

## 2023-08-25 NOTE — TOC Initial Note (Addendum)
 Transition of Care [facility]) - Initial/Assessment Note    Patient Details  Name: Patricia [name] MRN: [id number] Date of Birth: 29-Nov-1940  Transition of Care [facility]) CM/SW Contact:    Toy LITTIE Agar, RN Phone Number:[phone]  08/25/2023, 2:58 PM  Clinical Narrative:                 TOC following for disposition planning. CM called spouse on phone to discuss disposition planning. Spouse states that he was under the impression that Dr. Clayton  is setting everything up. Spouse states that he is unable to bring patient  home because he has has hernia surgery and he is not physically able to care for the patient. Spouse states that he is hoping for long term care at [facility]. CM has explained to spouse that these types of referrals are driven by therapy recommendations and followed through with [facility] collaborating with facility liaisons and the care team. Spouse acknowledges that patient has long term care insurance. Spouse is agreeable to hospice referral and would like [facility] for [facility] location. MD please enter referral for [facility] to consult Hospice. Patient continues to states that he is not able to care for patient at home.   CM has reached out to Hanson at [facility]. Per Randine the facility does have some long term beds and can review patient information once FL2 has been submitted. Randine does state that patient can not be receiving any active chemotherapy in order to be considered for long term care.   1554 FL2 completed and sent to Randine at [place] for review.   Expected Discharge Plan: Long Term Nursing Home Barriers to Discharge: Continued Medical Work up, Other (must enter comment) (Requires long term care)   Patient Goals and CMS Choice Patient states their goals for this hospitalization and ongoing recovery are:: Patient uanable to verbalize   Choice offered to / list presented to : Spouse (for Hospice Agency) [facility] ownership interest in [facility].provided to:: Spouse     Expected Discharge Plan and Services In-house Referral: NA Discharge Planning Services: CM Consult Post Acute Care Choice: NA Living arrangements for the past 2 months: Single Family Home                 DME Arranged: N/A DME Agency: NA       HH Arranged: NA HH Agency: NA        Prior Living Arrangements/Services Living arrangements for the past 2 months: Single Family Home Lives with:: Spouse Patient language and need for interpreter reviewed:: Yes        Need for Family Participation in Patient Care: Yes (Comment)     Criminal Activity/Legal Involvement Pertinent to Current Situation/Hospitalization: No - Comment as needed  Activities of Daily Living   ADL Screening (condition at time of admission) Independently performs ADLs?: No Does the patient have a NEW difficulty with bathing/dressing/toileting/self-feeding that is expected to last >3 days?: No Does the patient have a NEW difficulty with getting in/out of bed, walking, or climbing stairs that is expected to last >3 days?: Yes (Initiates electronic notice to provider for possible PT consult) Does the patient have a NEW difficulty with communication that is expected to last >3 days?: No Is the patient deaf or have difficulty hearing?: Yes Does the patient have difficulty seeing, even when wearing glasses/contacts?: No Does the patient have difficulty concentrating, remembering, or making decisions?: Yes  Permission Sought/Granted Permission sought to share information with : Family Supports Permission granted to share  information with : Yes, Verbal Permission Granted  Share Information with NAME: Davionna Blacksher [phone]     Permission granted to share info w Relationship: spouse  Permission granted to share info w Contact Information: [phone]  Emotional Assessment Appearance:: Appears stated age Attitude/Demeanor/Rapport: Other (comment) (sleepy) Affect (typically observed): Unable to  Assess Orientation: : Oriented to Self, Oriented to Place Alcohol / Substance Use: Not Applicable Psych Involvement: No (comment)  Admission diagnosis:  Weakness generalized [R53.1] Generalized weakness [R53.1] Metastatic malignant neoplasm, unspecified site [facility]) [C79.9] Patient Active Problem List   Diagnosis Date Noted   Metastatic malignant neoplasm (HCC) 08/23/2023   UTI (urinary tract infection) 08/22/2023   Pancytopenia (HCC) 08/22/2023   DNR (do not resuscitate) 08/22/2023   Dementia (HCC) 08/22/2023   Weakness generalized 08/22/2023   Counseling and coordination of care 08/22/2023   Goals of care, counseling/discussion 08/22/2023   Palliative care encounter 08/22/2023   Generalized weakness 08/21/2023   Anemia associated with chemotherapy 08/21/2023   Thrombocytopenia (HCC) 08/21/2023   Breast cancer metastasized to bone, unspecified laterality (HCC) 08/21/2023   A-fib (HCC) 08/14/2022   Atrial fibrillation with RVR (HCC) 08/13/2022   Pleural effusion, bilateral 08/13/2022   Acute on chronic heart failure with preserved ejection fraction (HFpEF) (HCC) 08/13/2022   Pain of left femur 07/28/2022   Metastasis to bone (HCC) 07/14/2021   Mitral regurgitation    Enlargement of right atrium 01/23/2020   Pulmonary HTN (HCC)    Seizure disorder (HCC)- 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 03/06/2019   Leukocytosis 12/05/2018   Bacteria in urine 12/05/2018   Altered mental status 12/05/2018   Cerebrovascular accident (HCC) 09/27/2017   Memory changes 09/27/2017   Hypocalcemia 05/27/2017   h/o Radiation 04/14/2017   High risk medication use 04/14/2017   Arthritis 01/27/2017   Prediabetes 01/06/2017   Vitamin D  deficiency 01/06/2017   Postmenopausal bone loss 01/06/2017   CKD (chronic kidney disease), stage III (HCC) 10/30/2016   Lung cancer, lingula (HCC) 09/08/2016   History of CVA (cerebrovascular accident) 05/14/2016   Abnormal PFT 10/24/2015   Persistent atrial fibrillation (HCC)     Chronic diastolic heart failure (HCC) 06/17/2015   Aortic regurgitation 06/17/2015   Essential hypertension 06/17/2015   CVA (cerebral vascular accident) (HCC) 04/30/2015   Cerebral infarction (HCC)    Pulmonary edema 04/27/2015   Primary open angle glaucoma of left eye, moderate stage 04/08/2015   Primary open angle glaucoma of right eye, mild stage 04/08/2015   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   Malignant neoplasm of upper-outer quadrant of left breast in female, estrogen receptor positive (HCC) 06/08/2013   Multinodular goiter 01/11/2013   Laryngospasm 12/23/2011   General medical examination 06/09/2010   Screening for malignant neoplasm of cervix 06/09/2010   ESOPHAGEAL STRICTURE 09/26/2009   Gastroesophageal reflux disease without esophagitis 09/26/2009   Hearing loss 08/15/2009   Hyperlipidemia 06/10/2009   Diverticulosis of large intestine 06/10/2009   Unspecified glaucoma 02/07/2009   Cough variant asthma 06/27/2007   Osteopenia 05/28/2007   POSTMENOPAUSAL STATUS 05/06/2007   Chronic cough 01/21/2007   LUNG CANCER, HX OF 07/29/2006   PCP:  Gayle Saddie FALCON PA-C Pharmacy:   [facility] - [place], [place] - [address] [place] [address] Phone: [phone] Fax: [phone]  [facility] [address] [place] - [address] [place] [place] [address] Phone: [phone] Fax: [phone]     Social Drivers of Health (SDOH) Social History: SDOH Screenings  Food Insecurity: No Food Insecurity (08/21/2023)  Housing: Low Risk  (08/21/2023)  Transportation Needs: No Transportation Needs (08/21/2023)  Utilities: Not At Risk (08/21/2023)  Alcohol Screen: Low Risk  (07/30/2023)  Depression (PHQ2-9): Medium Risk (08/03/2023)  Financial Resource Strain: Low Risk  (07/30/2023)  Physical Activity: Inactive (07/30/2023)  Social Connections: Moderately Integrated (08/21/2023)  Stress: No Stress  Concern Present (07/30/2023)  Tobacco Use: Medium Risk (08/20/2023)  Health Literacy: Adequate Health Literacy (10/29/2022)   SDOH Interventions:     Readmission Risk Interventions    08/25/2023    2:47 PM  Readmission Risk Prevention Plan  Transportation Screening Complete  PCP or Specialist Appt within 3-5 Days Complete  HRI or Home Care Consult Complete  Social Work Consult for Recovery Care Planning/Counseling Complete  Palliative Care Screening Complete  Medication Review Oceanographer) Complete

## 2023-08-25 NOTE — Progress Notes (Signed)
 PT Cancellation Note  Patient Details Name: Patricia [name] MRN: [id number] DOB: October 27, 1940   Cancelled Treatment:    Reason Eval/Treat Not Completed: Fatigue/lethargy limiting ability to participate (pt sleeping soundly, did not arouse to verbal stimulii. Will follow.)   Sylvan Delon Copp PT 08/25/2023  Acute Rehabilitation Services  Office [phone]

## 2023-08-25 NOTE — [place] FL2 (Signed)
 [place]  MEDICAID FL2 LEVEL OF CARE FORM     IDENTIFICATION  Patient Name: Patricia [name] Birthdate: 1940-06-10 Sex: female Admission Date (Current Location): 08/20/2023  [facility] and [place] Number:  Producer, television/film/video and Address:  [facility],  [address], [place] [address]      Provider Number:    Attending Physician Name and Address:  Lue Elsie BROCKS, MD  Relative Name and Phone Number:  Perrie Ragin   [phone]    Current Level of Care: Hospital Recommended Level of Care: Other (Comment) (SNF long term care) Prior Approval Number:    Date Approved/Denied:   PASRR Number:    Discharge Plan: SNF    Current Diagnoses: Patient Active Problem List   Diagnosis Date Noted   Metastatic malignant neoplasm (HCC) 08/23/2023   UTI (urinary tract infection) 08/22/2023   Pancytopenia (HCC) 08/22/2023   DNR (do not resuscitate) 08/22/2023   Dementia (HCC) 08/22/2023   Weakness generalized 08/22/2023   Counseling and coordination of care 08/22/2023   Goals of care, counseling/discussion 08/22/2023   Palliative care encounter 08/22/2023   Generalized weakness 08/21/2023   Anemia associated with chemotherapy 08/21/2023   Thrombocytopenia (HCC) 08/21/2023   Breast cancer metastasized to bone, unspecified laterality (HCC) 08/21/2023   A-fib (HCC) 08/14/2022   Atrial fibrillation with RVR (HCC) 08/13/2022   Pleural effusion, bilateral 08/13/2022   Acute on chronic heart failure with preserved ejection fraction (HFpEF) (HCC) 08/13/2022   Pain of left femur 07/28/2022   Metastasis to bone (HCC) 07/14/2021   Mitral regurgitation    Enlargement of right atrium 01/23/2020   Pulmonary HTN (HCC)    Seizure disorder (HCC)- 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 03/06/2019   Leukocytosis 12/05/2018   Bacteria in urine 12/05/2018   Altered mental status 12/05/2018   Cerebrovascular accident (HCC) 09/27/2017   Memory changes 09/27/2017   Hypocalcemia 05/27/2017   h/o  Radiation 04/14/2017   High risk medication use 04/14/2017   Arthritis 01/27/2017   Prediabetes 01/06/2017   Vitamin D  deficiency 01/06/2017   Postmenopausal bone loss 01/06/2017   CKD (chronic kidney disease), stage III (HCC) 10/30/2016   Lung cancer, lingula (HCC) 09/08/2016   History of CVA (cerebrovascular accident) 05/14/2016   Abnormal PFT 10/24/2015   Persistent atrial fibrillation (HCC)    Chronic diastolic heart failure (HCC) 06/17/2015   Aortic regurgitation 06/17/2015   Essential hypertension 06/17/2015   CVA (cerebral vascular accident) (HCC) 04/30/2015   Cerebral infarction (HCC)    Pulmonary edema 04/27/2015   Primary open angle glaucoma of left eye, moderate stage 04/08/2015   Primary open angle glaucoma of right eye, mild stage 04/08/2015   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   Malignant neoplasm of upper-outer quadrant of left breast in female, estrogen receptor positive (HCC) 06/08/2013   Multinodular goiter 01/11/2013   Laryngospasm 12/23/2011   General medical examination 06/09/2010   Screening for malignant neoplasm of cervix 06/09/2010   ESOPHAGEAL STRICTURE 09/26/2009   Gastroesophageal reflux disease without esophagitis 09/26/2009   Hearing loss 08/15/2009   Hyperlipidemia 06/10/2009   Diverticulosis of large intestine 06/10/2009   Unspecified glaucoma 02/07/2009   Cough variant asthma 06/27/2007   Osteopenia 05/28/2007   POSTMENOPAUSAL STATUS 05/06/2007   Chronic cough 01/21/2007   LUNG CANCER, HX OF 07/29/2006    Orientation RESPIRATION BLADDER Height & Weight     Self, Place    Incontinent Weight: 56.1 kg Height:  5' (152.4 cm)  BEHAVIORAL SYMPTOMS/MOOD NEUROLOGICAL BOWEL NUTRITION STATUS     (  n/a) Incontinent Diet (Regular diet)  AMBULATORY STATUS COMMUNICATION OF NEEDS Skin   Limited Assist Verbally Other (Comment) (Erythema/ redness noted to bilateral buttocks)                       Personal Care Assistance Level of  Assistance  Bathing, Feeding, Dressing Bathing Assistance: Limited assistance Feeding assistance: Independent Dressing Assistance: Limited assistance     Functional Limitations Info  Sight, Hearing, Speech     Speech Info: Adequate    SPECIAL CARE FACTORS FREQUENCY                       Contractures Contractures Info: Not present    Additional Factors Info  Code Status, Allergies, Psychotropic, Insulin Sliding Scale, Isolation Precautions, Suctioning Needs Code Status Info: DNR Allergies Info: Combigan (Brimonidine Tartrate-timolol ), Sulfa Antibiotics, Sulfamethoxazole-trimethoprim Psychotropic Info: see d/c summary Insulin Sliding Scale Info: see d/c summary Isolation Precautions Info: see d/c summary Suctioning Needs: see d/c summary   Current Medications (08/25/2023):  This is the current hospital active medication list Current Facility-Administered Medications  Medication Dose Route Frequency Provider Last Rate Last Admin   acetaminophen  (TYLENOL ) tablet 650 mg  650 mg Oral Q6H PRN Amponsah, Prosper M, MD   650 mg at 08/21/23 1850   Or   acetaminophen  (TYLENOL ) suppository 650 mg  650 mg Rectal Q6H PRN Lou Claretta HERO, MD       atorvastatin  (LIPITOR) tablet 40 mg  40 mg Oral QPM Amponsah, Prosper M, MD   40 mg at 08/24/23 1738   B-complex with vitamin C tablet 1 tablet  1 tablet Oral Daily Lynwood Setter, RPH   1 tablet at 08/25/23 9063   calcium -vitamin D  (OSCAL WITH D) 500-5 MG-MCG per tablet 1 tablet  1 tablet Oral Q breakfast Lou Claretta HERO, MD   1 tablet at 08/25/23 9062   cefTRIAXone  (ROCEPHIN ) 1 g in sodium chloride  0.9 % 100 mL IVPB  1 g Intravenous Q24H Lue Elsie BROCKS, MD 200 mL/hr at 08/24/23 2100 1 g at 08/24/23 2100   Chlorhexidine  Gluconate Cloth 2 % PADS 6 each  6 each Topical Daily Rai, Ripudeep K, MD   6 each at 08/25/23 1251   cholecalciferol  (VITAMIN D3) 25 MCG (1000 UNIT) tablet 2,000 Units  2,000 Units Oral Daily Amponsah, Prosper M,  MD   2,000 Units at 08/25/23 9062   digoxin  (LANOXIN ) tablet 0.125 mg  0.125 mg Oral Daily Amponsah, Prosper M, MD   0.125 mg at 08/25/23 9063   donepezil  (ARICEPT ) tablet 5 mg  5 mg Oral QHS Amponsah, Prosper M, MD   5 mg at 08/24/23 2208   dronabinol  (MARINOL ) capsule 5 mg  5 mg Oral BID AC Lou Claretta HERO, MD   5 mg at 08/25/23 1250   feeding supplement (ENSURE PLUS HIGH PROTEIN) liquid 237 mL  237 mL Oral TID BM Amponsah, Prosper M, MD   237 mL at 08/25/23 9062   fluticasone  (FLONASE ) 50 MCG/ACT nasal spray 1 spray  1 spray Each Nare BID Lou Claretta HERO, MD   1 spray at 08/25/23 9061   fluticasone  furoate-vilanterol (BREO ELLIPTA ) 200-25 MCG/ACT 1 puff  1 puff Inhalation Daily Lou Claretta HERO, MD   1 puff at 08/25/23 0934   folic acid  (FOLVITE ) tablet 0.5 mg  500 mcg Oral Daily Amponsah, Prosper M, MD   0.5 mg at 08/25/23 9062   furosemide  (LASIX ) tablet 40 mg  40 mg Oral  BID Croitoru, Mihai, MD   40 mg at 08/25/23 9062   gabapentin  (NEURONTIN ) capsule 400 mg  400 mg Oral BID Amponsah, Prosper M, MD   400 mg at 08/25/23 9062   latanoprost  (XALATAN ) 0.005 % ophthalmic solution 1 drop  1 drop Both Eyes BID Amponsah, Prosper M, MD   1 drop at 08/25/23 9061   levETIRAcetam  (KEPPRA  XR) 24 hr tablet 500 mg  500 mg Oral QHS Amponsah, Prosper M, MD   500 mg at 08/24/23 2208   loratadine  (CLARITIN ) tablet 10 mg  10 mg Oral Daily Amponsah, Prosper M, MD   10 mg at 08/25/23 9063   megestrol  (MEGACE ) 400 MG/10ML suspension 400 mg  400 mg Oral BID Amponsah, Prosper M, MD   400 mg at 08/25/23 9063   memantine  (NAMENDA ) tablet 10 mg  10 mg Oral BID Amponsah, Prosper M, MD   10 mg at 08/25/23 0937   metoprolol  tartrate (LOPRESSOR ) tablet 50 mg  50 mg Oral BID Amponsah, Prosper M, MD   50 mg at 08/25/23 0936   multivitamins with iron  tablet 1 tablet  1 tablet Oral Daily Lou Claretta HERO, MD   1 tablet at 08/25/23 0935   ondansetron  (ZOFRAN ) tablet 4 mg  4 mg Oral Q6H PRN Amponsah, Prosper M, MD        Or   ondansetron  (ZOFRAN ) injection 4 mg  4 mg Intravenous Q6H PRN Amponsah, Prosper M, MD       pantoprazole  (PROTONIX ) EC tablet 40 mg  40 mg Oral Daily Amponsah, Prosper M, MD   40 mg at 08/25/23 9063   senna-docusate (Senokot-S) tablet 1 tablet  1 tablet Oral QHS PRN Lou Claretta HERO, MD       sodium chloride  flush (NS) 0.9 % injection 10-40 mL  10-40 mL Intracatheter Q12H Rai, Ripudeep K, MD   10 mL at 08/25/23 9060   sodium chloride  flush (NS) 0.9 % injection 10-40 mL  10-40 mL Intracatheter PRN Rai, Ripudeep K, MD       spironolactone (ALDACTONE) tablet 25 mg  25 mg Oral Daily Croitoru, Mihai, MD   25 mg at 08/25/23 0937   thyroid  (ARMOUR) tablet 90 mg  90 mg Oral Daily Amponsah, Prosper M, MD   90 mg at 08/25/23 0935   traMADol  (ULTRAM ) tablet 50 mg  50 mg Oral Q6H PRN Amponsah, Prosper M, MD       Facility-Administered Medications Ordered in Other Encounters  Medication Dose Route Frequency Provider Last Rate Last Admin   acetaminophen  (TYLENOL ) tablet 650 mg  650 mg Oral Once Ennever, Peter R, MD       dexamethasone  (DECADRON ) 10 mg in sodium chloride  0.9 % 50 mL IVPB  10 mg Intravenous Once Ennever, Peter R, MD       dextrose  5 % solution   Intravenous Once Ennever, Peter R, MD       diphenhydrAMINE  (BENADRYL ) capsule 50 mg  50 mg Oral Once Ennever, Peter R, MD       fosaprepitant  (EMEND) 150 mg in sodium chloride  0.9 % 145 mL IVPB  150 mg Intravenous Once Ennever, Peter R, MD       palonosetron  (ALOXI ) injection 0.25 mg  0.25 mg Intravenous Once Ennever, Peter R, MD         Discharge Medications: Please see discharge summary for a list of discharge medications.  Relevant Imaging Results:  Relevant Lab Results:   Additional Information SS# [id number]     This is  a request for long term care. Patient has long term care policy.  Toy LITTIE Agar, RN

## 2023-08-25 NOTE — Plan of Care (Signed)
  Problem: Education: Goal: Knowledge of General Education information will improve Description: Including pain rating scale, medication(s)/side effects and non-pharmacologic comfort measures Outcome: Progressing   Problem: Health Behavior/Discharge Planning: Goal: Ability to manage health-related needs will improve Outcome: Progressing   Problem: Clinical Measurements: Goal: Ability to maintain clinical measurements within normal limits will improve Outcome: Progressing Goal: Will remain free from infection Outcome: Progressing Goal: Diagnostic test results will improve Outcome: Progressing Goal: Respiratory complications will improve Outcome: Progressing Goal: Cardiovascular complication will be avoided Outcome: Progressing   Problem: Activity: Goal: Risk for activity intolerance will decrease Outcome: Progressing   Problem: Nutrition: Goal: Adequate nutrition will be maintained Outcome: Progressing   Problem: Coping: Goal: Level of anxiety will decrease Outcome: Progressing   Problem: Pain Managment: Goal: General experience of comfort will improve and/or be controlled Outcome: Progressing   Problem: Skin Integrity: Goal: Risk for impaired skin integrity will decrease Outcome: Progressing

## 2023-08-25 NOTE — Progress Notes (Signed)
 PMT brief progress note  Resting in bed BP 136/74 (BP Location: Left Arm)   Pulse 83   Temp 98.7 F (37.1 C) (Oral)   Resp 18   Ht 5' (1.524 m)   Wt 56.1 kg   SpO2 95%   BMI 24.15 kg/m  TOC note reviewed Recommend outpatient palliative care Oncology also on board Recommend SNF on discharge, TOC assisting patient and husband with disposition arrangements.  Chart reviewed.  Continue current mode of care Medication history also reviewed Low MDM Lonia Serve MD [facility].

## 2023-08-26 ENCOUNTER — Ambulatory Visit
Admission: RE | Admit: 2023-08-26 | Discharge: 2023-08-26 | Disposition: A | Discharge status: Home / Self Care | Source: Ambulatory Visit | Attending: Radiation Oncology | Admitting: Radiation Oncology

## 2023-08-26 DIAGNOSIS — R531 Weakness: Secondary | ICD-10-CM | POA: Diagnosis not present

## 2023-08-26 MED ORDER — MELATONIN 5 MG PO TABS
5.0000 mg | ORAL_TABLET | Freq: Every evening | ORAL | Status: AC | PRN
  Administered 2023-08-26 – 2023-08-29 (×3): 5 mg via ORAL
  Filled 2023-08-26 (×3): qty 1

## 2023-08-26 NOTE — Progress Notes (Signed)
 Physical Therapy Treatment Patient Details Name: Patricia [name] MRN: [id number] DOB: May 11, 1940 Today's Date: 08/26/2023   History of Present Illness Patient is a 83 year old female who presented to hospital on 6/20 with R hip pain and weakness. Patient was admitted with R hip pain in setting of metastatic breast cancer, pancytopenia with anemia, UTI, chronic pleural effusion. 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    PT Comments  Pt tolerated increased ambulation distance this session. She ambulated 62' with RW, no loss of balance, distance limited by fatigue. She performed seated BUE/LE exercises at edge of bed. Spouse stated he cannot provide care at home as he has a hernia.     If plan is discharge home, recommend the following: A little help with walking and/or transfers;A little help with bathing/dressing/bathroom;Assistance with cooking/housework;Assist for transportation;Help with stairs or ramp for entrance   Can travel by private vehicle     Yes  Equipment Recommendations  None recommended by PT    Recommendations for Other Services       Precautions / Restrictions Precautions Precautions: Fall Recall of Precautions/Restrictions: Impaired Restrictions Weight Bearing Restrictions Per Provider Order: No     Mobility  Bed Mobility Overal bed mobility: Needs Assistance Bed Mobility: Supine to Sit, Sit to Supine     Supine to sit: Min assist Sit to supine: Min assist   General bed mobility comments: assist to raise trunk, then assist for BLEs into bed    Transfers Overall transfer level: Needs assistance Equipment used: Rolling walker (2 wheels) Transfers: Sit to/from Stand Sit to Stand: Min assist           General transfer comment: verbal cues for hand placement, assist to power up    Ambulation/Gait Ambulation/Gait assistance: Contact guard assist Gait Distance (Feet): 22 Feet Assistive device: Rolling  walker (2 wheels) Gait Pattern/deviations: Step-through pattern, Decreased stride length, Trunk flexed Gait velocity: decr     General Gait Details: steady with RW, distance limited by fatigue   Stairs             Wheelchair Mobility     Tilt Bed    Modified Rankin (Stroke Patients Only)       Balance Overall balance assessment: Needs assistance Sitting-balance support: No upper extremity supported, Feet supported Sitting balance-Leahy Scale: Good     Standing balance support: No upper extremity supported, During functional activity Standing balance-Leahy Scale: Fair                              Hotel manager: No apparent difficulties  Cognition Arousal: Alert Behavior During Therapy: Flat affect   PT - Cognitive impairments: Problem solving, Safety/Judgement                         Following commands: Intact      Cueing    Exercises  Shoulder flexion, both, AROM x 10 seated Long arc quads, both, AROM x 10 seated Ankle pumps, both, AROM x 10 seated Marching, both, AROM x 10 seated    General Comments        Pertinent Vitals/Pain Pain Assessment Pain Score: 0-No pain    Home Living                          Prior Function  PT Goals (current goals can now be found in the care plan section) Acute Rehab PT Goals PT Goal Formulation: With patient/family Time For Goal Achievement: 09/05/23 Potential to Achieve Goals: Good Progress towards PT goals: Progressing toward goals    Frequency    Min 2X/week      PT Plan      Co-evaluation              AM-PAC PT 6 Clicks Mobility   Outcome Measure  Help needed turning from your back to your side while in a flat bed without using bedrails?: A Little Help needed moving from lying on your back to sitting on the side of a flat bed without using bedrails?: A Little Help needed moving to and from a bed to a chair  (including a wheelchair)?: A Little Help needed standing up from a chair using your arms (e.g., wheelchair or bedside chair)?: A Little Help needed to walk in hospital room?: A Little Help needed climbing 3-5 steps with a railing? : A Lot 6 Click Score: 17    End of Session Equipment Utilized During Treatment: Gait belt Activity Tolerance: Patient limited by fatigue Patient left: with call bell/phone within reach;in bed;with family/visitor present;with bed alarm set Nurse Communication: Mobility status PT Visit Diagnosis: Difficulty in walking, not elsewhere classified (R26.2);Muscle weakness (generalized) (M62.81)     Time: 8876-8860 PT Time Calculation (min) (ACUTE ONLY): 16 min  Charges:    $Gait Training: 8-22 mins PT General Charges $$ ACUTE PT VISIT: 1 Visit                    Sylvan Delon Copp PT 08/26/2023  Acute Rehabilitation Services  Office [phone]

## 2023-08-26 NOTE — Progress Notes (Signed)
 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  Per spouse, patient has had poor appetite and weight loss over the last few weeks.  She was seen by her oncologist 2 weeks ago and was started on Marinol  and Megace  to help with appetite. Over the last few days, patient had been significantly weak and had difficulty getting in bed.  UA at intake concerning for UTI.  Hospitalist called for admission.  Patient admitted as above with worsening p.o. intake, weakness, ambulatory dysfunction and concern over failure to thrive complicated by what appears to be an acute UTI.  Clinically improving but given patient's profound ambulatory dysfunction and weakness discussion in regards to disposition with husband indicates she is not safe to return home as he cannot care for her in her current state.  Possible discharge to either facility with hospice or at hospice house given her recent decline and advanced disease  Patient remains medically stable for discharge once safe disposition is obtained.     Assessment & Plan:   Principal Problem:   Generalized weakness Active Problems:   Hyperlipidemia   Essential hypertension   History of CVA (cerebrovascular accident)   Anemia associated with chemotherapy   Thrombocytopenia (HCC)   Breast cancer metastasized to bone, unspecified laterality (HCC)   UTI (urinary tract infection)   Pancytopenia (HCC)   DNR (do not resuscitate)   Dementia (HCC)   Weakness generalized   Counseling and coordination of care   Goals of care, counseling/discussion   Palliative care encounter   Metastatic malignant  neoplasm (HCC)   UTI, acute cystitis - Complicated by immunocompromised state, - 5-day antibiotic course completed 6/25 - Blood cultures negative so far, urine culture with multiple species  Generalized weakness, FTT - In the setting of metastatic breast CA, stage IV presenting with generalized weakness, poor p.o. intake, worsening pancytopenia, UTI -Transfused 1 unit packed RBCs on 6/21, H&H stable -Continue Marinol , Megace , fall precautions - Palliative medicine, oncology following   Goals of care  - Lengthy discussion today with patient and her husband, we discussed prognosis which is a new topic for them as they are unaware of any specific/estimated prognosis. - Given patient's worsening condition, failure to thrive, UTI we discussed patient's wishes in regards to further procedures, imaging or invasive testing.   - At this time I would not recommend any further invasive testing and would focus on patient's comfort and quality of life  - I appreciate oncology and palliative care assistance with this conversation.  Right hip pain in the setting of metastatic breast CA, metastasis - Continue pain control  - Right hip x-ray shows sclerotic lesions, no obvious lytic lesion or bony destruction.  CT chest abdomen pelvis on 6/22 showed scattered sclerotic lesions in the bones slightly increased from 2024, no pathologic fractures. Would not repeat imaging given limited options for intervention at this time.   Pancytopenia with mild anemia, thrombocytopenia - Hemoglobin 9.9 on admission and received 1 unit packed RBCs - currently >11 - Avoid further blood products without overt symptoms or <8 given risk vs benefit   Metastatic breast cancer, Osseous metastases -  Continue gabapentin , Megace , Marinol , as needed tramadol   -  CT chest abdomen pelvis on 6/22 showed scattered sclerotic lesions in the bones slightly increased from 2024, no pathologic fractures, anasarca with moderate ascites,  loculated moderate left pleural effusion - US  thoracentesis, likely malignant.  Currently no hypoxia or abdominal pain, hold off on paracentesis - Oncology, palliative medicine following, recommended DNR.   Left moderate pleural effusion, also chronic pleural effusion - Likely secondary to breast malignancy - CT chest abdomen pelvis showed moderate loculated left pleural effusion and small right pleural effusion, emphysema, moderate ascites and anasarca - US  guided left thoracentesis on 6/23, 720 cc removed   Atrial fibrillation, rate controlled - HR stable, continue Lopressor  - Hold Eliquis  in the setting of thrombocytopenia    Chronic diastolic HF -  Continue on digoxin , Lasix  and Lopressor  - Echo showed EF 60-65%, no regional WMA, left ventricular diastolic function could not be evaluated, RV SF, massive biatrial dilation suspect secondary to underlying A-fib but could consider workup for infiltrative cardiomyopathy, left atrial size severely dilated, moderate AR - Cardiology onsulted   Hypophosphatemia - Continue replacement HLD - Continue atorvastatin  Hypothyroidism - Continue Armour - TSH 5.9 Seizure disorder - Continue Keppra  GERD - Continue Protonix  Dementia - Continue Aricept  and memantine  Asthma - No wheezing, continue home bronchodilators  DVT prophylaxis: SCDs Start: 08/21/23 0809   Code Status:   Code Status: Limited: Do not attempt resuscitation (DNR) -DNR-LIMITED -Do Not Intubate/DNI  Family Communication: Husband at bedside Status is: Inpatient  Dispo: The patient is from: Home              Anticipated d/c is to: To be determined              Anticipated d/c date is: Imminent              Patient currently is medically stable for discharge  Consultants:  Oncology, palliative care  Procedures:  Thoracentesis  Antimicrobials:  Ceftriaxone  x 5 days, stop date 08/25/2023  Subjective: No acute issues or events reported overnight  Objective: Vitals:    08/25/23 0933 08/25/23 2124 08/26/23 0618 08/26/23 0620  BP: 136/74 124/69 106/68   Pulse: 83 (!) 107 72   Resp:  18 18   Temp:  98.6 F (37 C) 98.2 F (36.8 C)   TempSrc:  Oral Oral   SpO2: 95% 92% 96%   Weight:    55.4 kg  Height:        Intake/Output Summary (Last 24 hours) at 08/26/2023 0755 Last data filed at 08/26/2023 0010 Gross per 24 hour  Intake 830.56 ml  Output 50 ml  Net 780.56 ml   Filed Weights   08/22/23 0705 08/25/23 0525 08/26/23 0620  Weight: 54.1 kg 56.1 kg 55.4 kg    Examination:  General:  Pleasantly resting in bed, No acute distress. HEENT:  Normocephalic atraumatic.  Sclerae nonicteric, noninjected.  Extraocular movements intact bilaterally. Neck:  Without mass or deformity.  Trachea is midline. Lungs:  Clear to auscultate bilaterally without rhonchi, wheeze, or rales. Heart:  Regular rate and rhythm.  Without murmurs, rubs, or gallops. Abdomen:  Soft, nontender, mildly distended.  Without guarding or rebound. Extremities: Without cyanosis, clubbing, 1+ pitting edema, or obvious deformity. Skin:  Warm and dry, no erythema.  Data Reviewed: I have personally reviewed following labs and imaging studies  CBC: Recent Labs  Lab 08/21/23 0031 08/21/23 1552 08/22/23 1038 08/23/23 0753 08/24/23 1045 08/25/23 0500  WBC 4.2 4.3 5.2 4.5 3.7* 3.8*  NEUTROABS 3.5  --   --  3.9 3.1 3.2  HGB 9.9* 12.1 13.4 12.2 12.9 11.7*  HCT 30.5* 36.1 39.3 35.8* 40.0 34.8*  MCV 106.6* 99.7 98.3 98.4 103.4* 99.4  PLT 57* 51* 57* 60* 81* 98*   Basic Metabolic Panel: Recent Labs  Lab 08/20/23 2328 08/22/23 0716 08/22/23 1038 08/23/23 0746 08/23/23 0753 08/24/23 1045 08/25/23 0500  NA 136  --  140  --  137 140 137  K 3.7  --  3.7  --  3.4* 3.0* 3.9  CL 104  --  112*  --  108 109 107  CO2 24  --  19*  --  19* 22 22  GLUCOSE 121*  --  126*  --  78 85 126*  BUN 25*  --  22  --  16 15 20   CREATININE 0.78  --  0.69  --  0.59 0.70 0.68  CALCIUM  8.2*  --  7.9*  --   7.5* 7.4* 8.2*  MG 2.3 2.3  --   --   --   --   --   PHOS 2.3* 1.1*  --  2.0*  --   --   --    GFR: Estimated Creatinine Clearance: 42.4 mL/min (by C-G formula based on SCr of 0.68 mg/dL). Liver Function Tests: Recent Labs  Lab 08/20/23 2328 08/22/23 1038 08/23/23 0753 08/24/23 1045 08/25/23 0500  AST 50* 58* 54* 56* 55*  ALT 46* 50* 45* 43 44  ALKPHOS 68 74 63 59 73  BILITOT 0.9 0.9 0.9 0.9 0.7  PROT 5.6* 5.6* 5.2* 5.2* 5.3*  ALBUMIN  2.8* 2.8* 2.6* 2.6* 2.5*   Recent Labs  Lab 08/20/23 2328  LIPASE 40    Recent Results (from the past 240 hours)  Urine Culture (for pregnant, neutropenic or urologic patients or patients with an indwelling urinary catheter)     Status: Abnormal   Collection Time: 08/21/23  6:28 PM   Specimen: Urine, Clean Catch  Result Value Ref Range Status   Specimen Description   Final    URINE, CLEAN CATCH Performed at [facility], [address], [place], [place] [address]    Special Requests   Final    NONE Performed at [facility], [address] [name], [place] [address]    Culture MULTIPLE SPECIES PRESENT, SUGGEST RECOLLECTION (A)  Final   Report Status 08/24/2023 FINAL  Final  Culture, blood (Routine X 2) w Reflex to ID Panel     Status: None (Preliminary result)   Collection Time: 08/21/23  7:49 PM   Specimen: BLOOD RIGHT HAND  Result Value Ref Range Status   Specimen Description BLOOD RIGHT HAND  Final   Special Requests AEROBIC BOTTLE ONLY Blood Culture adequate volume  Final   Culture   Final    NO GROWTH 3 DAYS Performed at [facility], [address] [place], [place] [address]    Report Status PENDING  Incomplete  Culture, blood (Routine X 2) w Reflex to ID Panel     Status: None (Preliminary result)   Collection Time: 08/21/23  7:49 PM   Specimen: BLOOD RIGHT HAND  Result Value Ref Range Status   Specimen Description BLOOD RIGHT HAND  Final   Special Requests AEROBIC BOTTLE ONLY  Blood Culture adequate volume  Final   Culture   Final    NO GROWTH 3 DAYS Performed at [facility], [address], [place], [place] [address]    Report Status PENDING  Incomplete  C Difficile Quick Screen w PCR reflex     Status: None   Collection Time: 08/22/23  3:57 AM   Specimen: STOOL  Result Value Ref Range Status   C Diff antigen NEGATIVE NEGATIVE Final   C Diff toxin NEGATIVE NEGATIVE Final   C Diff interpretation No C. difficile detected.  Final    Comment: Performed at [facility], [address] [place], [place] [address]  Gastrointestinal Panel by PCR , Stool     Status: None   Collection Time: 08/22/23  3:57 AM   Specimen: Stool  Result Value Ref Range Status   Campylobacter species NOT DETECTED NOT DETECTED Final   Plesimonas shigelloides NOT DETECTED NOT DETECTED Final   Salmonella species NOT DETECTED NOT DETECTED Final   Yersinia enterocolitica NOT DETECTED NOT DETECTED Final   Vibrio species NOT DETECTED NOT DETECTED Final   Vibrio cholerae NOT DETECTED NOT DETECTED Final   Enteroaggregative E coli (EAEC) NOT DETECTED NOT DETECTED Final   Enteropathogenic E coli (EPEC) NOT DETECTED NOT DETECTED Final   Enterotoxigenic E coli (ETEC) NOT DETECTED NOT DETECTED Final   Shiga like toxin producing E coli (STEC) NOT DETECTED NOT DETECTED Final   Shigella/Enteroinvasive E coli (EIEC) NOT DETECTED NOT DETECTED Final   Cryptosporidium NOT DETECTED NOT DETECTED Final   Cyclospora cayetanensis NOT DETECTED NOT DETECTED Final   Entamoeba histolytica NOT DETECTED NOT DETECTED Final   Giardia lamblia NOT DETECTED NOT DETECTED Final   Adenovirus F40/41 NOT DETECTED NOT DETECTED Final   Astrovirus NOT DETECTED NOT DETECTED Final   Norovirus GI/GII NOT DETECTED NOT DETECTED Final   Rotavirus A NOT DETECTED NOT DETECTED Final   Sapovirus (I, II, IV, and V) NOT DETECTED NOT DETECTED Final    Comment: Performed at [facility], [address] [place], [place] [address]  Culture, body fluid w Gram Stain-bottle     Status: None (Preliminary result)   Collection Time: 08/23/23  2:32 PM   Specimen: Fluid  Result Value Ref Range Status   Specimen Description FLUID PLEURAL  Final   Special Requests BOTTLES DRAWN AEROBIC AND ANAEROBIC  Final   Culture   Final    NO GROWTH 2 DAYS Performed at [facility], [address] [place], [place] [address]    Report Status PENDING  Incomplete  Gram stain     Status: None   Collection Time: 08/23/23  2:32 PM   Specimen: Fluid  Result Value Ref Range Status   Specimen Description FLUID PLEURAL  Final   Special Requests NONE  Final   Gram Stain   Final    WBC PRESENT,BOTH PMN AND MONONUCLEAR NO ORGANISMS SEEN CYTOSPIN SMEAR Performed at [facility], [address], [place], [place] [address]    Report Status 08/25/2023 FINAL  Final    Radiology Studies: No results found.  Scheduled Meds:  atorvastatin   40 mg Oral QPM   B-complex with vitamin C  1 tablet Oral Daily   calcium -vitamin D   1 tablet Oral Q breakfast   Chlorhexidine  Gluconate Cloth  6 each Topical Daily   cholecalciferol   2,000 Units Oral Daily   digoxin   0.125 mg Oral Daily   donepezil   5 mg Oral QHS   dronabinol   5 mg Oral BID AC   feeding supplement  237 mL Oral TID BM   fluticasone   1 spray Each Nare BID   fluticasone  furoate-vilanterol  1 puff Inhalation Daily  folic acid   500 mcg Oral Daily   furosemide   40 mg Oral BID   gabapentin   400 mg Oral BID   latanoprost   1 drop Both Eyes BID   levETIRAcetam   500 mg Oral QHS   loratadine   10 mg Oral Daily   megestrol   400 mg Oral BID   memantine   10 mg Oral BID   metoprolol  tartrate  50 mg Oral BID   multivitamins with iron   1 tablet Oral Daily   pantoprazole   40 mg Oral Daily   sodium chloride  flush  10-40 mL Intracatheter Q12H   spironolactone  25 mg Oral Daily   thyroid   90 mg Oral Daily   Continuous Infusions:     LOS: 4 days   Time  spent:  Elsie JAYSON Montclair, DO [facility]  If 7PM-7AM, please contact night-coverage [email or web address]  08/26/2023, 7:55 AM

## 2023-08-26 NOTE — Plan of Care (Signed)
  Problem: Education: Goal: Knowledge of General Education information will improve Description: Including pain rating scale, medication(s)/side effects and non-pharmacologic comfort measures Outcome: Progressing   Problem: Health Behavior/Discharge Planning: Goal: Ability to manage health-related needs will improve Outcome: Progressing   Problem: Clinical Measurements: Goal: Ability to maintain clinical measurements within normal limits will improve Outcome: Progressing Goal: Will remain free from infection Outcome: Progressing Goal: Diagnostic test results will improve Outcome: Progressing Goal: Respiratory complications will improve Outcome: Progressing Goal: Cardiovascular complication will be avoided Outcome: Progressing   Problem: Activity: Goal: Risk for activity intolerance will decrease Outcome: Progressing   Problem: Nutrition: Goal: Adequate nutrition will be maintained Outcome: Progressing   Problem: Pain Managment: Goal: General experience of comfort will improve and/or be controlled Outcome: Progressing   Problem: Safety: Goal: Ability to remain free from injury will improve Outcome: Progressing

## 2023-08-26 NOTE — Progress Notes (Signed)
 PMT brief progress note  Resting in bed, does not appear to be in distress, no family at bedside.  BP 121/72 (BP Location: Right Arm)   Pulse 83   Temp 98.2 F (36.8 C) (Oral)   Resp 18   Ht 5' (1.524 m)   Wt 55.4 kg   SpO2 90%   BMI 23.85 kg/m  TOC note reviewed Recommend outpatient palliative care Oncology also on board Recommend SNF on discharge, TOC assisting patient and husband with disposition arrangements. - under consideration for [facility] facility.  Chart reviewed.  Continue current mode of care Medication history also reviewed Low MDM Lonia Serve MD [facility].

## 2023-08-27 ENCOUNTER — Encounter: Payer: Self-pay | Admitting: Hematology & Oncology

## 2023-08-27 DIAGNOSIS — C799 Secondary malignant neoplasm of unspecified site: Secondary | ICD-10-CM | POA: Diagnosis not present

## 2023-08-27 DIAGNOSIS — R531 Weakness: Secondary | ICD-10-CM | POA: Diagnosis not present

## 2023-08-27 LAB — CULTURE, BLOOD (ROUTINE X 2)
Culture: NO GROWTH
Culture: NO GROWTH
Special Requests: ADEQUATE
Special Requests: ADEQUATE

## 2023-08-27 NOTE — Progress Notes (Signed)
 I talked with the patient's husband today by phone to provide emotional support.  I let him know that I concur with Dr. Jessy recommendations.  He seems to be coping relatively well.  He shared his feelings regarding his wife's health status as well as some recent health challenges that he is experiencing personally. He thanked me for my call and knows to contact us  if there is anything else we can do for him in the future. -----------------------------------  Lauraine Golden, MD

## 2023-08-27 NOTE — Progress Notes (Signed)
 Took patient V/S this evening , she desaturated to 85%,  Got her placed on 2 L of O2. Night nurse made aware. She will continue to monitor.

## 2023-08-27 NOTE — Progress Notes (Signed)
 Patient's BP at 1010 was 99/74, HR 82, Blood pressure and Diuretic due at this time held.  Lue Fallow, MD made aware. Will continue to monitor.

## 2023-08-27 NOTE — Plan of Care (Signed)

## 2023-08-27 NOTE — Plan of Care (Signed)
  Problem: Education: Goal: Knowledge of General Education information will improve Description: Including pain rating scale, medication(s)/side effects and non-pharmacologic comfort measures Outcome: Progressing   Problem: Clinical Measurements: Goal: Ability to maintain clinical measurements within normal limits will improve Outcome: Progressing   Problem: Coping: Goal: Level of anxiety will decrease Outcome: Progressing   

## 2023-08-27 NOTE — Progress Notes (Signed)
 PMT brief progress note  Resting in bed, does not appear to be in distress,met with husband outside the room.  BP 99/74 (BP Location: Right Arm)   Pulse 82   Temp 97.8 F (36.6 C) (Oral)   Resp 18   Ht 5' (1.524 m)   Wt 54.3 kg   SpO2 90%   BMI 23.38 kg/m  TOC note reviewed, [facility] MD note also reviewed.  Agree with residential hospice evaluation.  Oncology also on board Patient's husband asking about whether Monday's PET scan is going to be done or not. Discussed with him about comfort measures and residential hospice and likely no need for PET scan.  Hospice evaluation pending. Continue to monitor hospital course.   Chart reviewed.  Continue current mode of care Medication history also reviewed mod MDM Lonia Serve MD [facility].

## 2023-08-27 NOTE — Progress Notes (Signed)
 Unfortunately, I just think that she is declining secondary to this dementia.  She just is not eating.  I am not sure if she just does not remember to eat.  She really needs to go to a nursing home.  I know that the husband is really wanting her to go to [facility].  I totally agree.  I think this is the best and only option for her.  I know that she would get great care at [facility].  I do not think there is any pain that she has.  I do not think she has problems with fever.  There is no bleeding.  There is some I think bladder incontinence.  She has not had labs for couple days.  We really need to get some labs on her.  Her vital signs show temperature of 98.8.  Pulse 95.  Blood pressure 109/67.  Weight is 119 pounds.  Her lungs sound wheezy some decrease of the bases.  Her cardiac exam is regular rate and rhythm.  Abdomen is soft.  Bowel sounds are slightly decreased.  There is no fluid wave.  There is no palpable abdominal mass.  There is no palpable hepatomegaly.  Extremities shows no clubbing, cyanosis or edema.  Neurological exam shows no focal deficits.  Again she has the cognitive issues from her dementia.   I know she has a PET scan for June 30.  I am not sure that this is going to be done if she is can be in the hospital.  I think would be important for us  to get this.  If we see that she has significant progression of disease, I just do not see that we are going to be able to give any further therapy for her.  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  We had her on Enhertu  as she did have some low-level HER2 protein.  Again this is all about quality of life.  We will continue to follow along.  Again, she really, really, really needs to go to [facility]!!!!!!   Jeralyn Crease, MD  Ila 40:31

## 2023-08-27 NOTE — TOC Progression Note (Signed)
 Transition of Care [facility]) - Progression Note    Patient Details  Name: Patricia [name] MRN: [id number] Date of Birth: 1940/06/26  Transition of Care [facility]) CM/SW Contact  Sonda Manuella Quill, RN Phone Number: 08/27/2023, 2:08 PM  Clinical Narrative:    TOC notified by Dr Jeryl  pt's husband would like to discuss residential hospice residential hospice and is asking if a hospice liaison will contact him today; spoke w/ pt's husband Roxane Puerto [phone]); pt's husband says he is not interested in hospice; says he would like for pt to go to long-term care at [facility]; notified Randine Kobus, Admissions at facility; she will review referral, and notify TOC; is follow.   Expected Discharge Plan: Long Term Nursing Home Barriers to Discharge: Continued Medical Work up, Other (must enter comment) (Requires long term care)  Expected Discharge Plan and Services In-house Referral: NA Discharge Planning Services: CM Consult Post Acute Care Choice: NA Living arrangements for the past 2 months: Single Family Home                 DME Arranged: N/A DME Agency: NA       HH Arranged: NA HH Agency: NA         Social Determinants of Health (SDOH) Interventions SDOH Screenings   Food Insecurity: No Food Insecurity (08/21/2023)  Housing: Low Risk  (08/21/2023)  Transportation Needs: No Transportation Needs (08/21/2023)  Utilities: Not At Risk (08/21/2023)  Alcohol Screen: Low Risk  (07/30/2023)  Depression (PHQ2-9): Medium Risk (08/03/2023)  Financial Resource Strain: Low Risk  (07/30/2023)  Physical Activity: Inactive (07/30/2023)  Social Connections: Moderately Integrated (08/21/2023)  Stress: No Stress Concern Present (07/30/2023)  Tobacco Use: Medium Risk (08/20/2023)  Health Literacy: Adequate Health Literacy (10/29/2022)    Readmission Risk Interventions    08/25/2023    2:47 PM  Readmission Risk Prevention Plan  Transportation Screening Complete  PCP or Specialist Appt  within 3-5 Days Complete  HRI or Home Care Consult Complete  Social Work Consult for Recovery Care Planning/Counseling Complete  Palliative Care Screening Complete  Medication Review Oceanographer) Complete

## 2023-08-27 NOTE — Progress Notes (Signed)
 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  Per spouse, patient has had poor appetite and weight loss over the last few weeks.  She was seen by her oncologist 2 weeks ago and was started on Marinol  and Megace  to help with appetite. Over the last few days, patient had been significantly weak and had difficulty getting in bed.  UA at intake concerning for UTI.  Hospitalist called for admission.  Patient admitted as above with worsening p.o. intake, weakness, ambulatory dysfunction and concern over failure to thrive complicated by what appears to be an acute UTI.  Clinically improving but given patient's profound ambulatory dysfunction and weakness discussion in regards to disposition with husband indicates she is not safe to return home as he cannot care for her in her current state.  Possible discharge to either facility with hospice or at hospice house given her recent decline and advanced disease.  Patient remains medically stable for discharge once safe disposition is obtained.   Assessment & Plan:   Principal Problem:   Generalized weakness Active Problems:   Hyperlipidemia   Essential hypertension   History of CVA (cerebrovascular accident)   Anemia associated with chemotherapy   Thrombocytopenia (HCC)   Breast cancer metastasized to bone, unspecified laterality (HCC)   UTI (urinary tract infection)   Pancytopenia (HCC)   DNR (do not resuscitate)   Dementia (HCC)   Weakness generalized   Counseling and coordination of care   Goals of care, counseling/discussion   Palliative care encounter   Metastatic malignant neoplasm  (HCC)   UTI, acute cystitis - Complicated by immunocompromised state, - 5-day antibiotic course completed 6/25 - Blood cultures negative so far, urine culture with multiple species  Generalized weakness, FTT - In the setting of metastatic breast CA, stage IV presenting with generalized weakness, poor p.o. intake, worsening pancytopenia, UTI -Transfused 1 unit packed RBCs on 6/21, H&H stable -Continue Marinol , Megace , fall precautions - Palliative medicine, oncology following   Goals of care  - Lengthy discussion today with patient and her husband, we discussed prognosis which is a new topic for them as they are unaware of any specific/estimated prognosis. - Given patient's worsening condition, failure to thrive, UTI we discussed patient's wishes in regards to further procedures, imaging or invasive testing.   - At this time I would not recommend any further invasive testing and would focus on patient's comfort and quality of life  - I appreciate oncology and palliative care assistance with this conversation.  Right hip pain in the setting of metastatic breast CA, metastasis - Continue pain control  - Right hip x-ray shows sclerotic lesions, no obvious lytic lesion or bony destruction.  CT chest abdomen pelvis on 6/22 showed scattered sclerotic lesions in the bones slightly increased from 2024, no pathologic fractures. Would not repeat imaging given limited options for intervention at this time.   Pancytopenia with mild anemia, thrombocytopenia - Hemoglobin 9.9 on admission and received 1 unit packed RBCs -repeat H&H > 11 - Avoid further blood products without overt symptoms or <8 given risk vs benefit   Metastatic breast cancer, Osseous metastases -  Continue gabapentin , Megace , Marinol , as needed tramadol   -CT  chest abdomen pelvis on 6/22 showed scattered sclerotic lesions in the bones slightly increased from 2024, no pathologic fractures, anasarca with moderate ascites, loculated  moderate left pleural effusion - US  thoracentesis, likely malignant.  Currently no hypoxia or abdominal pain, hold off on paracentesis - Oncology, palliative medicine following, recommended DNR.   Left moderate pleural effusion, also chronic pleural effusion - Likely secondary to breast malignancy - CT chest abdomen pelvis showed moderate loculated left pleural effusion and small right pleural effusion, emphysema, moderate ascites and anasarca - US  guided left thoracentesis on 6/23, 720 cc removed   Atrial fibrillation, rate controlled - HR stable, continue Lopressor  - Hold Eliquis  in the setting of thrombocytopenia    Chronic diastolic HF -  Continue on digoxin , Lasix  and Lopressor  - Echo showed EF 60-65%, no regional WMA, left ventricular diastolic function could not be evaluated, RV SF, massive biatrial dilation suspect secondary to underlying A-fib but could consider workup for infiltrative cardiomyopathy, left atrial size severely dilated, moderate AR - Cardiology onsulted   Hypophosphatemia - Continue replacement HLD - Continue atorvastatin  Hypothyroidism - Continue Armour - TSH 5.9 Seizure disorder - Continue Keppra  GERD - Continue Protonix  Dementia - Continue Aricept  and memantine  Asthma - No wheezing, continue home bronchodilators  DVT prophylaxis: SCDs Start: 08/21/23 0809   Code Status:   Code Status: Limited: Do not attempt resuscitation (DNR) -DNR-LIMITED -Do Not Intubate/DNI  Family Communication: Husband at bedside Status is: Inpatient  Dispo: The patient is from: Home              Anticipated d/c is to: To be determined              Anticipated d/c date is: Pending safe dispo              Patient currently is medically stable for discharge  Consultants:  Oncology, palliative care  Procedures:  Thoracentesis  Antimicrobials:  Ceftriaxone  x 5 days, stop date 08/25/2023  Subjective: No acute issues or events reported overnight  Objective: Vitals:    08/26/23 1943 08/26/23 2008 08/26/23 2322 08/27/23 0604  BP: (!) 116/58 106/72  91/61  Pulse: 93   69  Resp: 20   18  Temp: 99.9 F (37.7 C)  98.4 F (36.9 C) (!) 97.5 F (36.4 C)  TempSrc: Oral  Oral Oral  SpO2: 90%   90%  Weight:      Height:       No intake or output data in the 24 hours ending 08/27/23 0804  Filed Weights   08/22/23 0705 08/25/23 0525 08/26/23 0620  Weight: 54.1 kg 56.1 kg 55.4 kg    Examination:  General:  Pleasantly resting in bed, No acute distress.  Somnolent but easily arousable, oriented to person HEENT:  Normocephalic atraumatic.  Sclerae nonicteric, noninjected.  Extraocular movements intact bilaterally. Neck:  Without mass or deformity.  Trachea is midline. Lungs:  Clear to auscultate bilaterally without rhonchi, wheeze, or rales. Heart:  Regular rate and rhythm.  Without murmurs, rubs, or gallops. Abdomen:  Soft, nontender, mildly distended.  Without guarding or rebound. Extremities: Without cyanosis, clubbing, 1+ pitting edema, or obvious deformity. Skin:  Warm and dry, no erythema.  Data Reviewed: I have personally reviewed following labs and imaging studies  CBC: Recent Labs  Lab 08/21/23 0031 08/21/23 1552 08/22/23 1038 08/23/23 0753 08/24/23 1045 08/25/23 0500  WBC 4.2 4.3 5.2 4.5 3.7* 3.8*  NEUTROABS 3.5  --   --  3.9 3.1 3.2  HGB 9.9* 12.1  13.4 12.2 12.9 11.7*  HCT 30.5* 36.1 39.3 35.8* 40.0 34.8*  MCV 106.6* 99.7 98.3 98.4 103.4* 99.4  PLT 57* 51* 57* 60* 81* 98*   Basic Metabolic Panel: Recent Labs  Lab 08/20/23 2328 08/22/23 0716 08/22/23 1038 08/23/23 0746 08/23/23 0753 08/24/23 1045 08/25/23 0500  NA 136  --  140  --  137 140 137  K 3.7  --  3.7  --  3.4* 3.0* 3.9  CL 104  --  112*  --  108 109 107  CO2 24  --  19*  --  19* 22 22  GLUCOSE 121*  --  126*  --  78 85 126*  BUN 25*  --  22  --  16 15 20   CREATININE 0.78  --  0.69  --  0.59 0.70 0.68  CALCIUM  8.2*  --  7.9*  --  7.5* 7.4* 8.2*  MG 2.3 2.3  --    --   --   --   --   PHOS 2.3* 1.1*  --  2.0*  --   --   --    GFR: Estimated Creatinine Clearance: 42.4 mL/min (by C-G formula based on SCr of 0.68 mg/dL). Liver Function Tests: Recent Labs  Lab 08/20/23 2328 08/22/23 1038 08/23/23 0753 08/24/23 1045 08/25/23 0500  AST 50* 58* 54* 56* 55*  ALT 46* 50* 45* 43 44  ALKPHOS 68 74 63 59 73  BILITOT 0.9 0.9 0.9 0.9 0.7  PROT 5.6* 5.6* 5.2* 5.2* 5.3*  ALBUMIN  2.8* 2.8* 2.6* 2.6* 2.5*   Recent Labs  Lab 08/20/23 2328  LIPASE 40    Recent Results (from the past 240 hours)  Urine Culture (for pregnant, neutropenic or urologic patients or patients with an indwelling urinary catheter)     Status: Abnormal   Collection Time: 08/21/23  6:28 PM   Specimen: Urine, Clean Catch  Result Value Ref Range Status   Specimen Description   Final    URINE, CLEAN CATCH Performed at [facility], [address], [place], [place] [address]    Special Requests   Final    NONE Performed at [facility], [address], [place], [place] [address]    Culture MULTIPLE SPECIES PRESENT, SUGGEST RECOLLECTION (A)  Final   Report Status 08/24/2023 FINAL  Final  Culture, blood (Routine X 2) w Reflex to ID Panel     Status: None (Preliminary result)   Collection Time: 08/21/23  7:49 PM   Specimen: BLOOD RIGHT HAND  Result Value Ref Range Status   Specimen Description BLOOD RIGHT HAND  Final   Special Requests AEROBIC BOTTLE ONLY Blood Culture adequate volume  Final   Culture   Final    NO GROWTH 4 DAYS Performed at [facility], [address], [place], [place] [address]    Report Status PENDING  Incomplete  Culture, blood (Routine X 2) w Reflex to ID Panel     Status: None (Preliminary result)   Collection Time: 08/21/23  7:49 PM   Specimen: BLOOD RIGHT HAND  Result Value Ref Range Status   Specimen Description BLOOD RIGHT HAND  Final   Special Requests AEROBIC BOTTLE ONLY Blood Culture adequate volume   Final   Culture   Final    NO GROWTH 4 DAYS Performed at [facility], [address], [place], [place] [address]    Report Status PENDING  Incomplete  C Difficile Quick Screen w PCR  reflex     Status: None   Collection Time: 08/22/23  3:57 AM   Specimen: STOOL  Result Value Ref Range Status   C Diff antigen NEGATIVE NEGATIVE Final   C Diff toxin NEGATIVE NEGATIVE Final   C Diff interpretation No C. difficile detected.  Final    Comment: Performed at [facility], [address] [place], [place] [address]  Gastrointestinal Panel by PCR , Stool     Status: None   Collection Time: 08/22/23  3:57 AM   Specimen: Stool  Result Value Ref Range Status   Campylobacter species NOT DETECTED NOT DETECTED Final   Plesimonas shigelloides NOT DETECTED NOT DETECTED Final   Salmonella species NOT DETECTED NOT DETECTED Final   Yersinia enterocolitica NOT DETECTED NOT DETECTED Final   Vibrio species NOT DETECTED NOT DETECTED Final   Vibrio cholerae NOT DETECTED NOT DETECTED Final   Enteroaggregative E coli (EAEC) NOT DETECTED NOT DETECTED Final   Enteropathogenic E coli (EPEC) NOT DETECTED NOT DETECTED Final   Enterotoxigenic E coli (ETEC) NOT DETECTED NOT DETECTED Final   Shiga like toxin producing E coli (STEC) NOT DETECTED NOT DETECTED Final   Shigella/Enteroinvasive E coli (EIEC) NOT DETECTED NOT DETECTED Final   Cryptosporidium NOT DETECTED NOT DETECTED Final   Cyclospora cayetanensis NOT DETECTED NOT DETECTED Final   Entamoeba histolytica NOT DETECTED NOT DETECTED Final   Giardia lamblia NOT DETECTED NOT DETECTED Final   Adenovirus F40/41 NOT DETECTED NOT DETECTED Final   Astrovirus NOT DETECTED NOT DETECTED Final   Norovirus GI/GII NOT DETECTED NOT DETECTED Final   Rotavirus A NOT DETECTED NOT DETECTED Final   Sapovirus (I, II, IV, and V) NOT DETECTED NOT DETECTED Final    Comment: Performed at [facility], [address] [place], [place] [address]   Culture, body fluid w Gram Stain-bottle     Status: None (Preliminary result)   Collection Time: 08/23/23  2:32 PM   Specimen: Fluid  Result Value Ref Range Status   Specimen Description FLUID PLEURAL  Final   Special Requests BOTTLES DRAWN AEROBIC AND ANAEROBIC  Final   Culture   Final    NO GROWTH 3 DAYS Performed at [facility], [address], [place], [place] [address]    Report Status PENDING  Incomplete  Gram stain     Status: None   Collection Time: 08/23/23  2:32 PM   Specimen: Fluid  Result Value Ref Range Status   Specimen Description FLUID PLEURAL  Final   Special Requests NONE  Final   Gram Stain   Final    WBC PRESENT,BOTH PMN AND MONONUCLEAR NO ORGANISMS SEEN CYTOSPIN SMEAR Performed at [facility], [address] [place], [place] [address]    Report Status 08/25/2023 FINAL  Final    Radiology Studies: No results found.  Scheduled Meds:  atorvastatin   40 mg Oral QPM   B-complex with vitamin C  1 tablet Oral Daily   calcium -vitamin D   1 tablet Oral Q breakfast   Chlorhexidine  Gluconate Cloth  6 each Topical Daily   cholecalciferol   2,000 Units Oral Daily   digoxin   0.125 mg Oral Daily   donepezil   5 mg Oral QHS   dronabinol   5 mg Oral BID AC   feeding supplement  237 mL Oral TID BM   fluticasone   1 spray Each Nare BID   fluticasone  furoate-vilanterol  1 puff Inhalation Daily   folic acid   500 mcg Oral  Daily   furosemide   40 mg Oral BID   gabapentin   400 mg Oral BID   latanoprost   1 drop Both Eyes BID   levETIRAcetam   500 mg Oral QHS   loratadine   10 mg Oral Daily   megestrol   400 mg Oral BID   memantine   10 mg Oral BID   metoprolol  tartrate  50 mg Oral BID   multivitamins with iron   1 tablet Oral Daily   pantoprazole   40 mg Oral Daily   sodium chloride  flush  10-40 mL Intracatheter Q12H   spironolactone   25 mg Oral Daily   thyroid   90 mg Oral Daily   Continuous Infusions:     LOS: 5 days   Time spent:  Elsie JAYSON [place], DO [facility]  If 7PM-7AM, please contact night-coverage [email or web address]  08/27/2023, 8:04 AM

## 2023-08-27 NOTE — Progress Notes (Signed)
 Patient identity verified x2. Patricia [name] presents in the clinic for a telephone follow up. 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  Fatigue post radiation: Yes, she got  Appetite good/fair/poor: Loss of appetite   Additional comments if applicable:  Dementia is getting

## 2023-08-28 DIAGNOSIS — C799 Secondary malignant neoplasm of unspecified site: Secondary | ICD-10-CM | POA: Diagnosis not present

## 2023-08-28 DIAGNOSIS — R531 Weakness: Secondary | ICD-10-CM | POA: Diagnosis not present

## 2023-08-28 LAB — CULTURE, BODY FLUID W GRAM STAIN -BOTTLE: Culture: NO GROWTH

## 2023-08-28 NOTE — Progress Notes (Signed)
 PMT brief progress note  Resting in bed, does not appear to be in distress,met with husband at bedside.  BP 102/68   Pulse 94   Temp 98.7 F (37.1 C) (Oral)   Resp 14   Ht 5' (1.524 m)   Wt 51.6 kg   SpO2 95%   BMI 22.22 kg/m  TOC note reviewed, TR[facility] MD note also reviewed. Dr Jessy note also reviewed. Goals of care and disposition options discussed with the patient's husband.  He wishes to continue with current mode of care, he is thankful for the information received from medical oncology as well as TOC Wishes to proceed with PET scan scheduled for 6/30 Plan remains for SNF-labs facility towards the end of this hospitalization Continue current mode of care No new inpatient palliative specific recommendations at this time   Medication history also reviewed mod MDM Lonia Serve MD [facility] palliative.

## 2023-08-28 NOTE — Progress Notes (Signed)
 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  Per spouse, patient has had poor appetite and weight loss over the last few weeks.  She was seen by her oncologist 2 weeks ago and was started on Marinol  and Megace  to help with appetite. Over the last few days, patient had been significantly weak and had difficulty getting in bed.  UA at intake concerning for UTI.  Hospitalist called for admission.  Patient admitted as above with worsening p.o. intake, weakness, ambulatory dysfunction and concern over failure to thrive complicated by what appears to be an acute UTI.  Clinically improving but given patient's profound ambulatory dysfunction and weakness discussion in regards to disposition with husband indicates she is not safe to return home as he cannot care for her in her current state.  Possible discharge to either facility with hospice or at hospice house given her recent decline and advanced disease.  Patient remains medically stable for discharge once safe disposition is obtained. Family has been 'promised' [facility] SN[facility] but it remains unclear if she meets criteria at this time. Family is refusing hospice care and was told there will still be repeat imaging (PET) which, per oncology, will not change trajectory of treatment plan given limited options in the setting of triple negative disease  Patient continues to decline - taking PO poorly, concern this is progression of disease complicated by advanced dementia - no indication that repeat labs at this time - as we continue to focus on comfort would recommend against lab draws, procedures,  repeat imaging, aggressive therapy unless the purpose/goal is to improve overall comfort/quality of life. Defer to oncology/radiation for ongoing testing/treatment per their expertise.   Assessment & Plan:   Principal Problem:   Generalized weakness Active Problems:   Hyperlipidemia   Essential hypertension   History of CVA (cerebrovascular accident)   Anemia associated with chemotherapy   Thrombocytopenia (HCC)   Breast cancer metastasized to bone, unspecified laterality (HCC)   UTI (urinary tract infection)   Pancytopenia (HCC)   DNR (do not resuscitate)   Dementia (HCC)   Weakness generalized   Counseling and coordination of care   Goals of care, counseling/discussion   Palliative care encounter   Metastatic malignant neoplasm (HCC)   UTI, acute cystitis - Complicated by immunocompromised state, - 5-day antibiotic course completed 6/25 - Blood cultures negative so far, urine culture with multiple species  Generalized weakness, FTT - In the setting of advanced metastatic breast CA, stage IV  - Complicated by worsening pancytopenia/UTI - Transfused 1 unit packed RBCs on 6/21, H&H stable - Continue Marinol , Megace , fall precautions - Palliative medicine, oncology following   Goals of care  - Lengthy discussion with patient and her husband, again attempted to discuss prognosis/goals of care. - Patient's husband is fixated on repeat PET scan despite our discussion that included Dr Jessy note indicating there is no 'further therapy for her' - At this time I would not recommend any further invasive testing and would focus on patient's comfort and quality of life  - I appreciate oncology and palliative care assistance with this conversation.  Right hip pain in the setting of metastatic breast CA,  metastasis - Continue pain control  - Right hip x-ray shows sclerotic lesions, no obvious lytic lesion or bony destruction.  CT chest abdomen pelvis on 6/22 showed scattered  sclerotic lesions in the bones slightly increased from 2024, no pathologic fractures. Would not repeat imaging given limited options for intervention at this time.   Pancytopenia with mild anemia, thrombocytopenia - Hemoglobin 9.9 on admission and received 1 unit packed RBCs - repeat H&H > 11 - Avoid further blood products without overt symptoms or <8 given risk vs benefit   Metastatic breast cancer, Osseous metastases -  Continue gabapentin , Megace , Marinol , as needed tramadol   -CT chest abdomen pelvis on 6/22 showed scattered sclerotic lesions in the bones slightly increased from 2024, no pathologic fractures, anasarca with moderate ascites, loculated moderate left pleural effusion - US  thoracentesis, likely malignant.  Currently no hypoxia or abdominal pain, hold off on paracentesis - Oncology, palliative medicine following, recommended DNR.   Left moderate pleural effusion, also chronic pleural effusion - Likely secondary to breast malignancy - CT chest abdomen pelvis showed moderate loculated left pleural effusion and small right pleural effusion, emphysema, moderate ascites and anasarca - US  guided left thoracentesis on 6/23, 720 cc removed   Atrial fibrillation, rate controlled - HR stable, continue Lopressor  - Hold Eliquis  in the setting of thrombocytopenia    Chronic diastolic HF - Continue on digoxin , Lasix  and Lopressor  - Echo showed EF 60-65%, no regional WMA, left ventricular diastolic function could not be evaluated, RV SF, massive biatrial dilation suspect secondary to underlying A-fib but could consider workup for infiltrative cardiomyopathy, left atrial size severely dilated, moderate AR - Cardiology onsulted   Hypophosphatemia - Continue replacement HLD - Continue atorvastatin  Hypothyroidism - Continue Armour - TSH 5.9 Seizure disorder - Continue Keppra  GERD - Continue Protonix  Dementia - Continue Aricept  and memantine  Asthma - No wheezing, continue home  bronchodilators  DVT prophylaxis: SCDs Start: 08/21/23 0809   Code Status:   Code Status: Limited: Do not attempt resuscitation (DNR) -DNR-LIMITED -Do Not Intubate/DNI  Family Communication: Husband at bedside Status is: Inpatient  Dispo: The patient is from: Home              Anticipated d/c is to: To be determined              Anticipated d/c date is: Pending safe dispo              Patient currently is medically stable for discharge  Consultants:  Oncology, palliative care  Procedures:  Thoracentesis  Antimicrobials:  Ceftriaxone  x 5 days, stop date 08/25/2023  Subjective: No acute issues or events reported overnight  Objective: Vitals:   08/27/23 1742 08/27/23 2102 08/28/23 0500 08/28/23 0548  BP: 108/70 128/69  (!) 102/55  Pulse: (!) 110 (!) 106  90  Resp:  15  14  Temp:  98.2 F (36.8 C)  98.7 F (37.1 C)  TempSrc:    Oral  SpO2: (!) 85% 91%  94%  Weight:   51.6 kg   Height:        Intake/Output Summary (Last 24 hours) at 08/28/2023 0806 Last data filed at 08/28/2023 0550 Gross per 24 hour  Intake 660 ml  Output --  Net 660 ml    Filed Weights   08/26/23 0620 08/27/23 0835 08/28/23 0500  Weight: 55.4 kg 54.3 kg 51.6 kg    Examination:  General:  Pleasantly resting in bed, No acute distress.  Somnolent but easily arousable, oriented to person HEENT:  Normocephalic atraumatic.  Sclerae nonicteric, noninjected.  Extraocular movements intact bilaterally. Neck:  Without mass or deformity.  Trachea is midline. Lungs:  Clear to auscultate bilaterally without rhonchi, wheeze, or rales. Heart:  Regular rate and rhythm.  Without murmurs, rubs, or gallops. Abdomen:  Soft, nontender, mildly distended.  Without guarding or rebound. Extremities: Without cyanosis, clubbing, 1+ pitting edema, or obvious deformity. Skin:  Warm and dry, no erythema.  Data Reviewed: I have personally reviewed following labs and imaging studies  CBC: Recent Labs  Lab 08/21/23 1552  08/22/23 1038 08/23/23 0753 08/24/23 1045 08/25/23 0500  WBC 4.3 5.2 4.5 3.7* 3.8*  NEUTROABS  --   --  3.9 3.1 3.2  HGB 12.1 13.4 12.2 12.9 11.7*  HCT 36.1 39.3 35.8* 40.0 34.8*  MCV 99.7 98.3 98.4 103.4* 99.4  PLT 51* 57* 60* 81* 98*   Basic Metabolic Panel: Recent Labs  Lab 08/22/23 0716 08/22/23 1038 08/23/23 0746 08/23/23 0753 08/24/23 1045 08/25/23 0500  NA  --  140  --  137 140 137  K  --  3.7  --  3.4* 3.0* 3.9  CL  --  112*  --  108 109 107  CO2  --  19*  --  19* 22 22  GLUCOSE  --  126*  --  78 85 126*  BUN  --  22  --  16 15 20   CREATININE  --  0.69  --  0.59 0.70 0.68  CALCIUM   --  7.9*  --  7.5* 7.4* 8.2*  MG 2.3  --   --   --   --   --   PHOS 1.1*  --  2.0*  --   --   --    GFR: Estimated Creatinine Clearance: 38.9 mL/min (by C-G formula based on SCr of 0.68 mg/dL). Liver Function Tests: Recent Labs  Lab 08/22/23 1038 08/23/23 0753 08/24/23 1045 08/25/23 0500  AST 58* 54* 56* 55*  ALT 50* 45* 43 44  ALKPHOS 74 63 59 73  BILITOT 0.9 0.9 0.9 0.7  PROT 5.6* 5.2* 5.2* 5.3*  ALBUMIN  2.8* 2.6* 2.6* 2.5*   No results for input(s): LIPASE, AMYLASE in the last 168 hours.   Recent Results (from the past 240 hours)  Urine Culture (for pregnant, neutropenic or urologic patients or patients with an indwelling urinary catheter)     Status: Abnormal   Collection Time: 08/21/23  6:28 PM   Specimen: Urine, Clean Catch  Result Value Ref Range Status   Specimen Description   Final    URINE, CLEAN CATCH Performed at [facility], [address], [place], [place] [address]    Special Requests   Final    NONE Performed at [facility], [address] [place], [place] [address]    Culture MULTIPLE SPECIES PRESENT, SUGGEST RECOLLECTION (A)  Final   Report Status 08/24/2023 FINAL  Final  Culture, blood (Routine X 2) w Reflex to ID Panel     Status: None   Collection Time: 08/21/23  7:49 PM   Specimen: BLOOD RIGHT HAND   Result Value Ref Range Status   Specimen Description BLOOD RIGHT HAND  Final   Special Requests AEROBIC BOTTLE ONLY Blood Culture adequate volume  Final   Culture   Final    NO GROWTH 5 DAYS Performed at [facility], [address] [place], [place] [address]    Report Status 08/27/2023 FINAL  Final  Culture, blood (Routine  X 2) w Reflex to ID Panel     Status: None   Collection Time: 08/21/23  7:49 PM   Specimen: BLOOD RIGHT HAND  Result Value Ref Range Status   Specimen Description BLOOD RIGHT HAND  Final   Special Requests AEROBIC BOTTLE ONLY Blood Culture adequate volume  Final   Culture   Final    NO GROWTH 5 DAYS Performed at [facility], [address], [place], [place] [address]    Report Status 08/27/2023 FINAL  Final  C Difficile Quick Screen w PCR reflex     Status: None   Collection Time: 08/22/23  3:57 AM   Specimen: STOOL  Result Value Ref Range Status   C Diff antigen NEGATIVE NEGATIVE Final   C Diff toxin NEGATIVE NEGATIVE Final   C Diff interpretation No C. difficile detected.  Final    Comment: Performed at [facility], [address] [place], [place] [address]  Gastrointestinal Panel by PCR , Stool     Status: None   Collection Time: 08/22/23  3:57 AM   Specimen: Stool  Result Value Ref Range Status   Campylobacter species NOT DETECTED NOT DETECTED Final   Plesimonas shigelloides NOT DETECTED NOT DETECTED Final   Salmonella species NOT DETECTED NOT DETECTED Final   Yersinia enterocolitica NOT DETECTED NOT DETECTED Final   Vibrio species NOT DETECTED NOT DETECTED Final   Vibrio cholerae NOT DETECTED NOT DETECTED Final   Enteroaggregative E coli (EAEC) NOT DETECTED NOT DETECTED Final   Enteropathogenic E coli (EPEC) NOT DETECTED NOT DETECTED Final   Enterotoxigenic E coli (ETEC) NOT DETECTED NOT DETECTED Final   Shiga like toxin producing E coli (STEC) NOT DETECTED NOT DETECTED Final   Shigella/Enteroinvasive E coli (EIEC)  NOT DETECTED NOT DETECTED Final   Cryptosporidium NOT DETECTED NOT DETECTED Final   Cyclospora cayetanensis NOT DETECTED NOT DETECTED Final   Entamoeba histolytica NOT DETECTED NOT DETECTED Final   Giardia lamblia NOT DETECTED NOT DETECTED Final   Adenovirus F40/41 NOT DETECTED NOT DETECTED Final   Astrovirus NOT DETECTED NOT DETECTED Final   Norovirus GI/GII NOT DETECTED NOT DETECTED Final   Rotavirus A NOT DETECTED NOT DETECTED Final   Sapovirus (I, II, IV, and V) NOT DETECTED NOT DETECTED Final    Comment: Performed at [facility], [address] [place], [place] [address]  Culture, body fluid w Gram Stain-bottle     Status: None (Preliminary result)   Collection Time: 08/23/23  2:32 PM   Specimen: Fluid  Result Value Ref Range Status   Specimen Description FLUID PLEURAL  Final   Special Requests BOTTLES DRAWN AEROBIC AND ANAEROBIC  Final   Culture   Final    NO GROWTH 4 DAYS Performed at [facility], [address] [name], [place] [address]    Report Status PENDING  Incomplete  Gram stain     Status: None   Collection Time: 08/23/23  2:32 PM   Specimen: Fluid  Result Value Ref Range Status   Specimen Description FLUID PLEURAL  Final   Special Requests NONE  Final   Gram Stain   Final    WBC PRESENT,BOTH PMN AND MONONUCLEAR NO ORGANISMS SEEN CYTOSPIN SMEAR Performed at [facility], [address], [place], [place] [address]    Report Status 08/25/2023 FINAL  Final    Radiology Studies: No results found.  Scheduled Meds:  atorvastatin   40 mg Oral QPM   B-complex  with vitamin C  1 tablet Oral Daily   calcium -vitamin D   1 tablet Oral Q breakfast   Chlorhexidine  Gluconate Cloth  6 each Topical Daily   cholecalciferol   2,000 Units Oral Daily   digoxin   0.125 mg Oral Daily   donepezil   5 mg Oral QHS   dronabinol   5 mg Oral BID AC   feeding supplement  237 mL Oral TID BM   fluticasone   1 spray Each Nare BID   fluticasone  furoate-vilanterol  1  puff Inhalation Daily   folic acid   500 mcg Oral Daily   furosemide   40 mg Oral BID   gabapentin   400 mg Oral BID   latanoprost   1 drop Both Eyes BID   levETIRAcetam   500 mg Oral QHS   loratadine   10 mg Oral Daily   megestrol   400 mg Oral BID   memantine   10 mg Oral BID   metoprolol  tartrate  50 mg Oral BID   multivitamins with iron   1 tablet Oral Daily   pantoprazole   40 mg Oral Daily   sodium chloride  flush  10-40 mL Intracatheter Q12H   spironolactone   25 mg Oral Daily   thyroid   90 mg Oral Daily   Continuous Infusions:     LOS: 6 days   Time spent:  Elsie JAYSON Montclair, DO [facility]  If 7PM-7AM, please contact night-coverage [email or web address]  08/28/2023, 8:06 AM

## 2023-08-28 NOTE — Plan of Care (Signed)
  Problem: Clinical Measurements: Goal: Will remain free from infection Outcome: Progressing   Problem: Coping: Goal: Level of anxiety will decrease Outcome: Progressing   Problem: Skin Integrity: Goal: Risk for impaired skin integrity will decrease Outcome: Progressing

## 2023-08-29 DIAGNOSIS — R531 Weakness: Secondary | ICD-10-CM | POA: Diagnosis not present

## 2023-08-29 DIAGNOSIS — C799 Secondary malignant neoplasm of unspecified site: Secondary | ICD-10-CM | POA: Diagnosis not present

## 2023-08-29 NOTE — Plan of Care (Signed)
  Problem: Clinical Measurements: Goal: Ability to maintain clinical measurements within normal limits will improve Outcome: Progressing Goal: Will remain free from infection Outcome: Progressing   Problem: Pain Managment: Goal: General experience of comfort will improve and/or be controlled Outcome: Progressing

## 2023-08-29 NOTE — Progress Notes (Signed)
 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  Per spouse, patient has had poor appetite and weight loss over the last few weeks.  She was seen by her oncologist 2 weeks ago and was started on Marinol  and Megace  to help with appetite. Over the last few days, patient had been significantly weak and had difficulty getting in bed.  UA at intake concerning for UTI.  Hospitalist called for admission.  Patient remains medically stable for discharge once safe disposition is obtained. Family has been 'promised' [facility] long term SNF but it remains unclear if she meets criteria at this time. 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   Patient continues to decline - taking PO poorly even with assistance, concern this is progression of disease complicated by advanced dementia - as we continue to focus on comfort would recommend against lab draws, procedures, repeat imaging, aggressive therapy unless the purpose/goal is to improve overall comfort/quality of life. Defer to oncology/radiation for ongoing testing/treatment per their expertise. Labs x1 am 6/30 in preparation for possible PET scan.   Assessment & Plan:   Principal Problem:   Generalized weakness Active Problems:   Hyperlipidemia   Essential hypertension   History of CVA (cerebrovascular accident)   Anemia associated with chemotherapy   Thrombocytopenia (HCC)   Breast cancer metastasized to bone, unspecified laterality  (HCC)   UTI (urinary tract infection)   Pancytopenia (HCC)   DNR (do not resuscitate)   Dementia (HCC)   Weakness generalized   Counseling and coordination of care   Goals of care, counseling/discussion   Palliative care encounter   Metastatic malignant neoplasm (HCC)   UTI, acute cystitis - Complicated by immunocompromised state, - 5-day antibiotic course completed 6/25 - Blood cultures negative so far, urine culture with multiple species  Generalized weakness, FTT - In the setting of advanced metastatic breast CA, stage IV  - Complicated by worsening pancytopenia/UTI - Transfused 1 unit packed RBCs on 6/21, H&H stable - Continue Marinol , Megace , fall precautions - Palliative medicine, oncology following   Goals of care  - Lengthy discussion with patient and her husband, again attempted to discuss prognosis/goals of care. - Patient's husband is fixated on repeat PET scan despite our discussion that included Dr Jessy note indicating there is no 'further therapy for her' - At this time I would not recommend any further invasive testing and would focus on patient's comfort and quality of life  - I appreciate oncology and palliative care assistance with this conversation.  Right hip pain in the setting of metastatic breast CA, metastasis - Continue pain control  - Right hip x-ray shows sclerotic lesions, no obvious lytic lesion or bony destruction.  CT chest abdomen pelvis on 6/22 showed scattered sclerotic lesions in the bones slightly increased from 2024, no pathologic fractures. Would not repeat imaging given limited options for intervention at this time.   Pancytopenia with mild anemia, thrombocytopenia - Hemoglobin 9.9 on admission and received 1 unit packed RBCs - repeat H&H > 11 -  Avoid further blood products without overt symptoms or <8 given risk vs benefit   Metastatic breast cancer, Osseous metastases -  Continue gabapentin , Megace , Marinol , as needed tramadol   -CT  chest abdomen pelvis on 6/22 showed scattered sclerotic lesions in the bones slightly increased from 2024, no pathologic fractures, anasarca with moderate ascites, loculated moderate left pleural effusion - US  thoracentesis, likely malignant.  Currently no hypoxia or abdominal pain, hold off on paracentesis - Oncology, palliative medicine following, recommended DNR.   Left moderate pleural effusion, also chronic pleural effusion - Likely secondary to breast malignancy - CT chest abdomen pelvis showed moderate loculated left pleural effusion and small right pleural effusion, emphysema, moderate ascites and anasarca - US  guided left thoracentesis on 6/23, 720 cc removed   Atrial fibrillation, rate controlled - HR stable, continue Lopressor  - Hold Eliquis  in the setting of thrombocytopenia    Chronic diastolic HF - Continue on digoxin , Lasix  and Lopressor  - Echo showed EF 60-65%, no regional WMA, left ventricular diastolic function could not be evaluated, RV SF, massive biatrial dilation suspect secondary to underlying A-fib but could consider workup for infiltrative cardiomyopathy, left atrial size severely dilated, moderate AR - Cardiology onsulted   Hypophosphatemia - Continue replacement HLD - Continue atorvastatin  Hypothyroidism - Continue Armour - TSH 5.9 Seizure disorder - Continue Keppra  GERD - Continue Protonix  Dementia - Continue Aricept  and memantine  Asthma - No wheezing, continue home bronchodilators  DVT prophylaxis: SCDs Start: 08/21/23 0809   Code Status:   Code Status: Limited: Do not attempt resuscitation (DNR) -DNR-LIMITED -Do Not Intubate/DNI  Family Communication: Husband at bedside Status is: Inpatient  Dispo: The patient is from: Home              Anticipated d/c is to: To be determined, ?SNF              Anticipated d/c date is: Pending safe dispo              Patient currently is medically stable for discharge  Consultants:  Oncology, palliative  care  Procedures:  Thoracentesis  Antimicrobials:  Ceftriaxone  stop date 08/25/2023  Subjective: No acute issues or events reported overnight, ROS limited by patient's baseline mental status  Objective: Vitals:   08/28/23 0840 08/28/23 1313 08/28/23 2000 08/29/23 0623  BP: 102/68 106/67 111/67 (!) 114/58  Pulse: 94 86 (!) 104 91  Resp:  20 16 16   Temp:  (!) 97.4 F (36.3 C) 98 F (36.7 C) 98.8 F (37.1 C)  TempSrc:  Oral Oral Oral  SpO2: 95% 94% 93% 90%  Weight:    51.6 kg  Height:        Intake/Output Summary (Last 24 hours) at 08/29/2023 0750 Last data filed at 08/28/2023 2200 Gross per 24 hour  Intake 120 ml  Output --  Net 120 ml    Filed Weights   08/27/23 0835 08/28/23 0500 08/29/23 0623  Weight: 54.3 kg 51.6 kg 51.6 kg    Examination:  General:  Pleasantly resting in bed, No acute distress.  Somnolent but easily arousable, oriented to person HEENT:  Normocephalic atraumatic.  Sclerae nonicteric, noninjected.  Extraocular movements intact bilaterally. Neck:  Without mass or deformity.  Trachea is midline. Lungs:  Clear to auscultate bilaterally without rhonchi, wheeze, or rales. Heart:  Regular rate and rhythm.  Without murmurs, rubs, or gallops. Abdomen:  Soft, nontender, mildly distended.  Without guarding or rebound. Extremities: Without cyanosis, clubbing, 1+ pitting edema, or obvious deformity. Skin:  Warm and  dry, no erythema.  Data Reviewed: I have personally reviewed following labs and imaging studies  CBC: Recent Labs  Lab 08/22/23 1038 08/23/23 0753 08/24/23 1045 08/25/23 0500  WBC 5.2 4.5 3.7* 3.8*  NEUTROABS  --  3.9 3.1 3.2  HGB 13.4 12.2 12.9 11.7*  HCT 39.3 35.8* 40.0 34.8*  MCV 98.3 98.4 103.4* 99.4  PLT 57* 60* 81* 98*   Basic Metabolic Panel: Recent Labs  Lab 08/22/23 1038 08/23/23 0746 08/23/23 0753 08/24/23 1045 08/25/23 0500  NA 140  --  137 140 137  K 3.7  --  3.4* 3.0* 3.9  CL 112*  --  108 109 107  CO2 19*  --   19* 22 22  GLUCOSE 126*  --  78 85 126*  BUN 22  --  16 15 20   CREATININE 0.69  --  0.59 0.70 0.68  CALCIUM  7.9*  --  7.5* 7.4* 8.2*  PHOS  --  2.0*  --   --   --    GFR: Estimated Creatinine Clearance: 38.9 mL/min (by C-G formula based on SCr of 0.68 mg/dL). Liver Function Tests: Recent Labs  Lab 08/22/23 1038 08/23/23 0753 08/24/23 1045 08/25/23 0500  AST 58* 54* 56* 55*  ALT 50* 45* 43 44  ALKPHOS 74 63 59 73  BILITOT 0.9 0.9 0.9 0.7  PROT 5.6* 5.2* 5.2* 5.3*  ALBUMIN  2.8* 2.6* 2.6* 2.5*   No results for input(s): LIPASE, AMYLASE in the last 168 hours.   Recent Results (from the past 240 hours)  Urine Culture (for pregnant, neutropenic or urologic patients or patients with an indwelling urinary catheter)     Status: Abnormal   Collection Time: 08/21/23  6:28 PM   Specimen: Urine, Clean Catch  Result Value Ref Range Status   Specimen Description   Final    URINE, CLEAN CATCH Performed at [facility], [address] [place], [place] [address]    Special Requests   Final    NONE Performed at [facility], [address], [place], [place] [address]    Culture MULTIPLE SPECIES PRESENT, SUGGEST RECOLLECTION (A)  Final   Report Status 08/24/2023 FINAL  Final  Culture, blood (Routine X 2) w Reflex to ID Panel     Status: None   Collection Time: 08/21/23  7:49 PM   Specimen: BLOOD RIGHT HAND  Result Value Ref Range Status   Specimen Description BLOOD RIGHT HAND  Final   Special Requests AEROBIC BOTTLE ONLY Blood Culture adequate volume  Final   Culture   Final    NO GROWTH 5 DAYS Performed at [facility], [address] [place], [place] [address]    Report Status 08/27/2023 FINAL  Final  Culture, blood (Routine X 2) w Reflex to ID Panel     Status: None   Collection Time: 08/21/23  7:49 PM   Specimen: BLOOD RIGHT HAND  Result Value Ref Range Status   Specimen Description BLOOD RIGHT HAND  Final   Special Requests  AEROBIC BOTTLE ONLY Blood Culture adequate volume  Final   Culture   Final    NO GROWTH 5 DAYS Performed at [facility], [address] [place], [place] [address]    Report Status 08/27/2023 FINAL  Final  C Difficile Quick Screen w PCR reflex     Status: None   Collection Time: 08/22/23  3:57 AM   Specimen: STOOL  Result Value Ref Range Status  C Diff antigen NEGATIVE NEGATIVE Final   C Diff toxin NEGATIVE NEGATIVE Final   C Diff interpretation No C. difficile detected.  Final    Comment: Performed at [facility], [address], [place], [place] [address]  Gastrointestinal Panel by PCR , Stool     Status: None   Collection Time: 08/22/23  3:57 AM   Specimen: Stool  Result Value Ref Range Status   Campylobacter species NOT DETECTED NOT DETECTED Final   Plesimonas shigelloides NOT DETECTED NOT DETECTED Final   Salmonella species NOT DETECTED NOT DETECTED Final   Yersinia enterocolitica NOT DETECTED NOT DETECTED Final   Vibrio species NOT DETECTED NOT DETECTED Final   Vibrio cholerae NOT DETECTED NOT DETECTED Final   Enteroaggregative E coli (EAEC) NOT DETECTED NOT DETECTED Final   Enteropathogenic E coli (EPEC) NOT DETECTED NOT DETECTED Final   Enterotoxigenic E coli (ETEC) NOT DETECTED NOT DETECTED Final   Shiga like toxin producing E coli (STEC) NOT DETECTED NOT DETECTED Final   Shigella/Enteroinvasive E coli (EIEC) NOT DETECTED NOT DETECTED Final   Cryptosporidium NOT DETECTED NOT DETECTED Final   Cyclospora cayetanensis NOT DETECTED NOT DETECTED Final   Entamoeba histolytica NOT DETECTED NOT DETECTED Final   Giardia lamblia NOT DETECTED NOT DETECTED Final   Adenovirus F40/41 NOT DETECTED NOT DETECTED Final   Astrovirus NOT DETECTED NOT DETECTED Final   Norovirus GI/GII NOT DETECTED NOT DETECTED Final   Rotavirus A NOT DETECTED NOT DETECTED Final   Sapovirus (I, II, IV, and V) NOT DETECTED NOT DETECTED Final    Comment: Performed at [facility], [address] [place], [place] [address]  Culture, body fluid w Gram Stain-bottle     Status: None   Collection Time: 08/23/23  2:32 PM   Specimen: Fluid  Result Value Ref Range Status   Specimen Description FLUID PLEURAL  Final   Special Requests BOTTLES DRAWN AEROBIC AND ANAEROBIC  Final   Culture   Final    NO GROWTH 5 DAYS Performed at [facility], [address], [place], [place] [address]    Report Status 08/28/2023 FINAL  Final  Gram stain     Status: None   Collection Time: 08/23/23  2:32 PM   Specimen: Fluid  Result Value Ref Range Status   Specimen Description FLUID PLEURAL  Final   Special Requests NONE  Final   Gram Stain   Final    WBC PRESENT,BOTH PMN AND MONONUCLEAR NO ORGANISMS SEEN CYTOSPIN SMEAR Performed at [facility], [address] [place], [place] [address]    Report Status 08/25/2023 FINAL  Final    Radiology Studies: No results found.  Scheduled Meds:  atorvastatin   40 mg Oral QPM   B-complex with vitamin C  1 tablet Oral Daily   calcium -vitamin D   1 tablet Oral Q breakfast   Chlorhexidine  Gluconate Cloth  6 each Topical Daily   cholecalciferol   2,000 Units Oral Daily   digoxin   0.125 mg Oral Daily   donepezil   5 mg Oral QHS   dronabinol   5 mg Oral BID AC   feeding supplement  237 mL Oral TID BM   fluticasone   1 spray Each Nare BID   fluticasone  furoate-vilanterol  1 puff Inhalation Daily   folic acid   500 mcg Oral Daily   furosemide   40 mg Oral BID   gabapentin   400 mg Oral BID   latanoprost   1 drop Both Eyes BID  levETIRAcetam   500 mg Oral QHS   loratadine   10 mg Oral Daily   megestrol   400 mg Oral BID   memantine   10 mg Oral BID   metoprolol  tartrate  50 mg Oral BID   multivitamins with iron   1 tablet Oral Daily   pantoprazole   40 mg Oral Daily   sodium chloride  flush  10-40 mL Intracatheter Q12H   spironolactone   25 mg Oral Daily   thyroid   90 mg Oral Daily   Continuous Infusions:     LOS: 7 days    Time spent:  Elsie JAYSON Montclair, DO [facility]  If 7PM-7AM, please contact night-coverage [email or web address]  08/29/2023, 7:50 AM

## 2023-08-29 NOTE — Plan of Care (Signed)

## 2023-08-30 ENCOUNTER — Encounter (HOSPITAL_COMMUNITY): Payer: Self-pay

## 2023-08-30 ENCOUNTER — Encounter (HOSPITAL_COMMUNITY): Admission: RE | Admit: 2023-08-30 | Source: Ambulatory Visit

## 2023-08-30 DIAGNOSIS — R531 Weakness: Secondary | ICD-10-CM | POA: Diagnosis not present

## 2023-08-30 LAB — CBC WITH DIFFERENTIAL/PLATELET
Abs Immature Granulocytes: 0.03 10*3/uL (ref 0.00–0.07)
Basophils Absolute: 0 10*3/uL (ref 0.0–0.1)
Basophils Relative: 1 %
Eosinophils Absolute: 0 10*3/uL (ref 0.0–0.5)
Eosinophils Relative: 0 %
HCT: 32.7 % — ABNORMAL LOW (ref 36.0–46.0)
Hemoglobin: 10.9 g/dL — ABNORMAL LOW (ref 12.0–15.0)
Immature Granulocytes: 2 %
Lymphocytes Relative: 10 %
Lymphs Abs: 0.2 10*3/uL — ABNORMAL LOW (ref 0.7–4.0)
MCH: 34 pg (ref 26.0–34.0)
MCHC: 33.3 g/dL (ref 30.0–36.0)
MCV: 101.9 fL — ABNORMAL HIGH (ref 80.0–100.0)
Monocytes Absolute: 0.3 10*3/uL (ref 0.1–1.0)
Monocytes Relative: 16 %
Neutro Abs: 1.4 10*3/uL — ABNORMAL LOW (ref 1.7–7.7)
Neutrophils Relative %: 71 %
Platelets: 77 10*3/uL — ABNORMAL LOW (ref 150–400)
RBC: 3.21 MIL/uL — ABNORMAL LOW (ref 3.87–5.11)
RDW: 19.9 % — ABNORMAL HIGH (ref 11.5–15.5)
WBC: 1.9 10*3/uL — ABNORMAL LOW (ref 4.0–10.5)
nRBC: 1.1 % — ABNORMAL HIGH (ref 0.0–0.2)

## 2023-08-30 LAB — BASIC METABOLIC PANEL WITH GFR
Anion gap: 11 (ref 5–15)
BUN: 26 mg/dL — ABNORMAL HIGH (ref 8–23)
CO2: 29 mmol/L (ref 22–32)
Calcium: 8.1 mg/dL — ABNORMAL LOW (ref 8.9–10.3)
Chloride: 99 mmol/L (ref 98–111)
Creatinine, Ser: 0.66 mg/dL (ref 0.44–1.00)
GFR, Estimated: >60 mL/min (ref >60)
Glucose, Bld: 106 mg/dL — ABNORMAL HIGH (ref 70–99)
Potassium: 3.2 mmol/L — ABNORMAL LOW (ref 3.5–5.1)
Sodium: 139 mmol/L (ref 135–145)

## 2023-08-30 MED ORDER — POTASSIUM CHLORIDE 20 MEQ PO PACK
40.0000 meq | PACK | Freq: Once | ORAL | Status: AC
  Administered 2023-08-30: 40 meq via ORAL
  Filled 2023-08-30: qty 2

## 2023-08-30 NOTE — Plan of Care (Signed)
 Palliative care no charge note Chart reviewed Palliative care following peripherally Palliative care remains available for further goals of care discussions Medical oncology also following closely It appears that the plan remains for SNF - [facility] facility towards the end of this hospitalization. No new inpatient palliative specific recommendations at this time. No charge Lonia Serve MD [place] palliative.

## 2023-08-30 NOTE — TOC Progression Note (Signed)
 Transition of Care [facility]) - Progression Note    Patient Details  Name: Patricia [name] MRN: [id number] Date of Birth: 23-May-1940  Transition of [facility]) CM/SW Contact  Toy LITTIE Agar, RN Phone Number:[phone]  08/30/2023, 12:05 PM  Clinical Narrative:    CM has sent message to [facility] to determine if facility will be able to make bed offer. Awaiting response.   Expected Discharge Plan: Long Term Nursing Home Barriers to Discharge: Continued Medical Work up, Other (must enter comment) (Requires long term care)  Expected Discharge Plan and Services In-house Referral: NA Discharge Planning Services: CM Consult Post Acute Care Choice: NA Living arrangements for the past 2 months: Single Family Home                 DME Arranged: N/A DME Agency: NA       HH Arranged: NA HH Agency: NA         Social Determinants of Health (SDOH) Interventions SDOH Screenings   Food Insecurity: No Food Insecurity (08/21/2023)  Housing: Low Risk  (08/21/2023)  Transportation Needs: No Transportation Needs (08/21/2023)  Utilities: Not At Risk (08/21/2023)  Alcohol Screen: Low Risk  (07/30/2023)  Depression (PHQ2-9): Medium Risk (08/03/2023)  Financial Resource Strain: Low Risk  (07/30/2023)  Physical Activity: Inactive (07/30/2023)  Social Connections: Moderately Integrated (08/21/2023)  Stress: No Stress Concern Present (07/30/2023)  Tobacco Use: Medium Risk (08/20/2023)  Health Literacy: Adequate Health Literacy (10/29/2022)    Readmission Risk Interventions    08/25/2023    2:47 PM  Readmission Risk Prevention Plan  Transportation Screening Complete  PCP or Specialist Appt within 3-5 Days Complete  HRI or Home Care Consult Complete  Social Work Consult for Recovery Care Planning/Counseling Complete  Palliative Care Screening Complete  Medication Review Oceanographer) Complete

## 2023-08-30 NOTE — Progress Notes (Signed)
 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  Per spouse, patient has had poor appetite and weight loss over the last few weeks.  She was seen by her oncologist 2 weeks ago and was started on Marinol  and Megace  to help with appetite. Over the last few days, patient had been significantly weak and had difficulty getting in bed. UA at intake concerning for UTI. Hospitalist called for admission.  Patient remains medically stable for discharge once safe disposition is obtained. 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   Patient continues to decline - taking PO poorly even with assistance, concern this is progression of disease complicated by advanced dementia - as we continue to focus on comfort would recommend against lab draws, procedures, repeat imaging, aggressive therapy unless the purpose/goal is to improve overall comfort/quality of life. Defer to oncology/radiation for ongoing testing/treatment per their expertise. Labs x1 am 6/30 in preparation for possible PET scan.   Assessment & Plan:   Principal Problem:   Generalized weakness Active Problems:   Hyperlipidemia   Essential hypertension   History of CVA (cerebrovascular accident)   Anemia associated with chemotherapy   Thrombocytopenia (HCC)   Breast cancer metastasized to bone, unspecified laterality (HCC)   UTI (urinary tract infection)   Pancytopenia (HCC)   DNR (do not resuscitate)   Dementia (HCC)    Weakness generalized   Counseling and coordination of care   Goals of care, counseling/discussion   Palliative care encounter   Metastatic malignant neoplasm (HCC)  UTI, acute cystitis - Complicated by immunocompromised state - 5-day antibiotic course completed 6/25 - Blood cultures negative so far, urine culture with multiple species  Generalized weakness, FTT - In the setting of advanced metastatic breast CA, stage IV  - Complicated by worsening pancytopenia/UTI - Transfused 1 unit packed RBCs on 6/21, H&H stable - Continue Marinol , Megace , fall precautions - Palliative medicine, oncology following  Goals of care  - Lengthy discussion with patient and her husband, again attempted to discuss prognosis/goals of care. - Patient's husband is fixated on repeat PET scan despite our discussion that included Dr Jessy note indicating there is no 'further therapy for her' - At this time I would not recommend any further invasive testing and would focus on patient's comfort and quality of life  - I appreciate oncology and palliative care assistance with this conversation.  Right hip pain in the setting of metastatic breast CA, metastasis - Continue pain control  - Right hip x-ray shows sclerotic lesions, no obvious lytic lesion or bony destruction.  CT chest abdomen pelvis on 6/22 showed scattered sclerotic lesions in the bones slightly increased from 2024, no pathologic fractures. Would not repeat imaging given limited options for intervention at this time.   Pancytopenia with mild anemia, thrombocytopenia - Hemoglobin 9.9 on admission and received 1 unit packed RBCs - repeat H&H > 11 - Avoid further blood products without overt symptoms or <8 given risk vs benefit   Metastatic breast cancer, Osseous metastases - Continue gabapentin ,  Megace , Marinol , as needed tramadol   - CT chest abdomen pelvis on 6/22 showed scattered sclerotic lesions in the bones slightly increased from 2024, no  pathologic fractures, anasarca with moderate ascites, loculated moderate left pleural effusion - US  thoracentesis, likely malignant.  Currently no hypoxia or abdominal pain, hold off on paracentesis - Oncology, palliative medicine following, recommended DNR.   Left moderate pleural effusion, also chronic pleural effusion - Likely secondary to breast malignancy - CT chest abdomen pelvis showed moderate loculated left pleural effusion and small right pleural effusion, emphysema, moderate ascites and anasarca - US  guided left thoracentesis on 6/23, 720 cc removed   Atrial fibrillation, rate controlled - HR stable, continue Lopressor  - Hold Eliquis  in the setting of thrombocytopenia    Chronic diastolic HF - Continue on digoxin , Lasix  and Lopressor  - Echo showed EF 60-65%, no regional WMA, left ventricular diastolic function could not be evaluated, RV SF, massive biatrial dilation suspect secondary to underlying A-fib but could consider workup for infiltrative cardiomyopathy, left atrial size severely dilated, moderate AR - Cardiology onsulted   Hypophosphatemia - Continue replacement HLD - Continue atorvastatin  Hypothyroidism - Continue Armour - TSH 5.9 Seizure disorder - Continue Keppra  GERD - Continue Protonix  Dementia - Continue Aricept  and memantine  Asthma - No wheezing, continue home bronchodilators  DVT prophylaxis: SCDs Start: 08/21/23 0809   Code Status:   Code Status: Limited: Do not attempt resuscitation (DNR) -DNR-LIMITED -Do Not Intubate/DNI  Family Communication: Husband at bedside Status is: Inpatient  Dispo: The patient is from: Home              Anticipated d/c is to: To be determined, ?SNF              Anticipated d/c date is: Pending safe dispo              Patient currently is medically stable for discharge  Consultants:  Oncology, palliative care  Procedures:  Thoracentesis  Antimicrobials:  Ceftriaxone  stop date 08/25/2023  Subjective: No acute issues  or events reported overnight, ROS limited by patient's baseline mental status  Objective: Vitals:   08/29/23 0902 08/29/23 1328 08/29/23 2030 08/30/23 0507  BP: 114/68 103/72 111/70 114/75  Pulse: 84 96 90 79  Resp:  18 14 14   Temp:  98.3 F (36.8 C) 98.4 F (36.9 C) 98.3 F (36.8 C)  TempSrc:  Oral Oral Oral  SpO2: 93% 90% 90% 93%  Weight:    53.2 kg  Height:        Intake/Output Summary (Last 24 hours) at 08/30/2023 0738 Last data filed at 08/29/2023 0900 Gross per 24 hour  Intake 240 ml  Output --  Net 240 ml    Filed Weights   08/28/23 0500 08/29/23 0623 08/30/23 0507  Weight: 51.6 kg 51.6 kg 53.2 kg    Examination:  General:  Pleasantly resting in bed, No acute distress.  Somnolent but easily arousable, oriented to person HEENT:  Normocephalic atraumatic.  Sclerae nonicteric, noninjected.  Extraocular movements intact bilaterally. Neck:  Without mass or deformity.  Trachea is midline. Lungs:  Clear to auscultate bilaterally without rhonchi, wheeze, or rales. Heart:  Regular rate and rhythm.  Without murmurs, rubs, or gallops. Abdomen:  Soft, nontender, mildly distended.  Without guarding or rebound. Extremities: Without cyanosis, clubbing, 1+ pitting edema, or obvious deformity. Skin:  Warm and dry, no erythema.  Data Reviewed: I have personally reviewed following labs and imaging studies  CBC: Recent Labs  Lab 08/23/23 0753 08/24/23 1045 08/25/23  0500 08/30/23 0500  WBC 4.5 3.7* 3.8* 1.9*  NEUTROABS 3.9 3.1 3.2 1.4*  HGB 12.2 12.9 11.7* 10.9*  HCT 35.8* 40.0 34.8* 32.7*  MCV 98.4 103.4* 99.4 101.9*  PLT 60* 81* 98* 77*   Basic Metabolic Panel: Recent Labs  Lab 08/23/23 0746 08/23/23 0753 08/24/23 1045 08/25/23 0500 08/30/23 0500  NA  --  137 140 137 139  K  --  3.4* 3.0* 3.9 3.2*  CL  --  108 109 107 99  CO2  --  19* 22 22 29   GLUCOSE  --  78 85 126* 106*  BUN  --  16 15 20  26*  CREATININE  --  0.59 0.70 0.68 0.66  CALCIUM   --  7.5* 7.4*  8.2* 8.1*  PHOS 2.0*  --   --   --   --    GFR: Estimated Creatinine Clearance: 38.9 mL/min (by C-G formula based on SCr of 0.66 mg/dL). Liver Function Tests: Recent Labs  Lab 08/23/23 0753 08/24/23 1045 08/25/23 0500  AST 54* 56* 55*  ALT 45* 43 44  ALKPHOS 63 59 73  BILITOT 0.9 0.9 0.7  PROT 5.2* 5.2* 5.3*  ALBUMIN  2.6* 2.6* 2.5*   No results for input(s): LIPASE, AMYLASE in the last 168 hours.   Recent Results (from the past 240 hours)  Urine Culture (for pregnant, neutropenic or urologic patients or patients with an indwelling urinary catheter)     Status: Abnormal   Collection Time: 08/21/23  6:28 PM   Specimen: Urine, Clean Catch  Result Value Ref Range Status   Specimen Description   Final    URINE, CLEAN CATCH Performed at [facility], [address] [place], [place] [address]    Special Requests   Final    NONE Performed at [facility], [address], [place], [place] [address]    Culture MULTIPLE SPECIES PRESENT, SUGGEST RECOLLECTION (A)  Final   Report Status 08/24/2023 FINAL  Final  Culture, blood (Routine X 2) w Reflex to ID Panel     Status: None   Collection Time: 08/21/23  7:49 PM   Specimen: BLOOD RIGHT HAND  Result Value Ref Range Status   Specimen Description BLOOD RIGHT HAND  Final   Special Requests AEROBIC BOTTLE ONLY Blood Culture adequate volume  Final   Culture   Final    NO GROWTH 5 DAYS Performed at [facility], [address], [place], [place] [address]    Report Status 08/27/2023 FINAL  Final  Culture, blood (Routine X 2) w Reflex to ID Panel     Status: None   Collection Time: 08/21/23  7:49 PM   Specimen: BLOOD RIGHT HAND  Result Value Ref Range Status   Specimen Description BLOOD RIGHT HAND  Final   Special Requests AEROBIC BOTTLE ONLY Blood Culture adequate volume  Final   Culture   Final    NO GROWTH 5 DAYS Performed at [facility], [address] [place], [place]  [address]    Report Status 08/27/2023 FINAL  Final  C Difficile Quick Screen w PCR reflex     Status: None   Collection Time: 08/22/23  3:57 AM   Specimen: STOOL  Result Value Ref Range Status   C Diff antigen NEGATIVE NEGATIVE Final   C Diff toxin NEGATIVE NEGATIVE Final   C Diff interpretation No C. difficile detected.  Final    Comment: Performed at [facility], [address] [place], [place] [address]  Gastrointestinal Panel by PCR , Stool     Status: None   Collection Time: 08/22/23  3:57 AM   Specimen: Stool  Result Value Ref Range Status   Campylobacter species NOT DETECTED NOT DETECTED Final   Plesimonas shigelloides NOT DETECTED NOT DETECTED Final   Salmonella species NOT DETECTED NOT DETECTED Final   Yersinia enterocolitica NOT DETECTED NOT DETECTED Final   Vibrio species NOT DETECTED NOT DETECTED Final   Vibrio cholerae NOT DETECTED NOT DETECTED Final   Enteroaggregative E coli (EAEC) NOT DETECTED NOT DETECTED Final   Enteropathogenic E coli (EPEC) NOT DETECTED NOT DETECTED Final   Enterotoxigenic E coli (ETEC) NOT DETECTED NOT DETECTED Final   Shiga like toxin producing E coli (STEC) NOT DETECTED NOT DETECTED Final   Shigella/Enteroinvasive E coli (EIEC) NOT DETECTED NOT DETECTED Final   Cryptosporidium NOT DETECTED NOT DETECTED Final   Cyclospora cayetanensis NOT DETECTED NOT DETECTED Final   Entamoeba histolytica NOT DETECTED NOT DETECTED Final   Giardia lamblia NOT DETECTED NOT DETECTED Final   Adenovirus F40/41 NOT DETECTED NOT DETECTED Final   Astrovirus NOT DETECTED NOT DETECTED Final   Norovirus GI/GII NOT DETECTED NOT DETECTED Final   Rotavirus A NOT DETECTED NOT DETECTED Final   Sapovirus (I, II, IV, and V) NOT DETECTED NOT DETECTED Final    Comment: Performed at [facility], [address] [place], [place] [address]  Culture, body fluid w Gram Stain-bottle     Status: None   Collection Time: 08/23/23  2:32 PM   Specimen: Fluid   Result Value Ref Range Status   Specimen Description FLUID PLEURAL  Final   Special Requests BOTTLES DRAWN AEROBIC AND ANAEROBIC  Final   Culture   Final    NO GROWTH 5 DAYS Performed at [facility], [address] [place], [place] [address]    Report Status 08/28/2023 FINAL  Final  Gram stain     Status: None   Collection Time: 08/23/23  2:32 PM   Specimen: Fluid  Result Value Ref Range Status   Specimen Description FLUID PLEURAL  Final   Special Requests NONE  Final   Gram Stain   Final    WBC PRESENT,BOTH PMN AND MONONUCLEAR NO ORGANISMS SEEN CYTOSPIN SMEAR Performed at [facility], [address], [place], [place] [address]    Report Status 08/25/2023 FINAL  Final    Radiology Studies: No results found.  Scheduled Meds:  atorvastatin   40 mg Oral QPM   B-complex with vitamin C  1 tablet Oral Daily   calcium -vitamin D   1 tablet Oral Q breakfast   Chlorhexidine  Gluconate Cloth  6 each Topical Daily   cholecalciferol   2,000 Units Oral Daily   digoxin   0.125 mg Oral Daily   donepezil   5 mg Oral QHS   dronabinol   5 mg Oral BID AC   feeding supplement  237 mL Oral TID BM   fluticasone   1 spray Each Nare BID   fluticasone  furoate-vilanterol  1 puff Inhalation Daily   folic acid   500 mcg Oral Daily   furosemide   40 mg Oral BID   gabapentin   400 mg Oral BID   latanoprost   1 drop Both Eyes BID   levETIRAcetam   500 mg Oral QHS   loratadine   10 mg Oral Daily   megestrol   400 mg Oral BID   memantine   10 mg Oral BID   multivitamins with iron   1 tablet Oral Daily   pantoprazole   40 mg Oral Daily   potassium chloride   40 mEq Oral Once   sodium chloride  flush  10-40 mL Intracatheter Q12H   spironolactone   25 mg Oral Daily   thyroid   90 mg Oral Daily   Continuous Infusions:     LOS: 8 days   Time spent:  Elsie JAYSON Montclair, DO [facility]  If 7PM-7AM, please contact night-coverage [email or web address]  08/30/2023, 7:38 AM

## 2023-08-30 NOTE — Plan of Care (Signed)

## 2023-08-30 NOTE — Progress Notes (Signed)
 Physical Therapy Treatment Patient Details Name: Patricia [name] MRN: [id number] DOB: 04/09/1940 Today's Date: 08/30/2023   History of Present Illness Patient is a 83 year old female who presented to hospital on 6/20 with R hip pain and weakness. Patient was admitted with R hip pain in setting of metastatic breast cancer, pancytopenia with anemia, UTI, chronic pleural effusion. 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    PT Comments  Pt supine in bed asleep.  Difficult to rouse and keep awake during session. Nursing reports she has been like this most of the day.  Pt unable to stay alert so further progression of gt deferred.  Continue to recommend rehab in a post acute setting.  Pt only alert to self this pm.      If plan is discharge home, recommend the following: A little help with walking and/or transfers;A little help with bathing/dressing/bathroom;Assistance with cooking/housework;Assist for transportation;Help with stairs or ramp for entrance   Can travel by private vehicle     Yes  Equipment Recommendations  None recommended by PT    Recommendations for Other Services       Precautions / Restrictions Precautions Precautions: Fall Recall of Precautions/Restrictions: Impaired Restrictions Weight Bearing Restrictions Per Provider Order: No     Mobility  Bed Mobility Overal bed mobility: Needs Assistance Bed Mobility: Supine to Sit, Sit to Supine Rolling: Contact guard assist Sidelying to sit: Contact guard assist Supine to sit: Min assist Sit to supine: Min assist   General bed mobility comments: assist to raise trunk, then assist for BLEs into bed    Transfers Overall transfer level: Needs assistance Equipment used: None, 1 person hand held assist Transfers: Sit to/from Stand Sit to Stand: Min assist           General transfer comment: verbal cues for hand placement, assist to power up     Ambulation/Gait Ambulation/Gait assistance: Min assist Gait Distance (Feet):  (side steps along edge of bed.) Assistive device: 1 person hand held assist Gait Pattern/deviations: Shuffle, Trunk flexed Gait velocity: decr     General Gait Details: Pt very sleepy this afternoon and at times intermittently falling asleep when talking to PTA, focused on bed level therapies and edge of bed OOB activities due to level of alertness.   Stairs             Wheelchair Mobility     Tilt Bed    Modified Rankin (Stroke Patients Only)       Balance Overall balance assessment: Needs assistance Sitting-balance support: No upper extremity supported, Feet supported Sitting balance-Leahy Scale: Good     Standing balance support: No upper extremity supported, During functional activity Standing balance-Leahy Scale: Fair                              Hotel manager: No apparent difficulties  Cognition Arousal: Alert Behavior During Therapy: Flat affect   PT - Cognitive impairments: Problem solving, Safety/Judgement                         Following commands: Intact      Cueing    Exercises General Exercises - Lower Extremity Ankle Circles/Pumps: AROM, AAROM, Both, 20 reps (increased time and tactile feedback, falling asleep during exercises.) Heel Slides: AAROM, Both, 10 reps, Supine (tactile guidance d/t level of alertness.)    General  Comments        Pertinent Vitals/Pain Pain Assessment Pain Assessment: Faces Faces Pain Scale: Hurts a little bit Pain Location: all over, L foot Pain Descriptors / Indicators: Discomfort Pain Intervention(s): Monitored during session, Repositioned    Home Living                          Prior Function            PT Goals (current goals can now be found in the care plan section) Acute Rehab PT Goals PT Goal Formulation: With patient/family Potential to Achieve Goals:  Good Progress towards PT goals: Progressing toward goals    Frequency    Min 2X/week      PT Plan      Co-evaluation              AM-PAC PT 6 Clicks Mobility   Outcome Measure  Help needed turning from your back to your side while in a flat bed without using bedrails?: A Little Help needed moving from lying on your back to sitting on the side of a flat bed without using bedrails?: A Little Help needed moving to and from a bed to a chair (including a wheelchair)?: A Little Help needed standing up from a chair using your arms (e.g., wheelchair or bedside chair)?: A Little Help needed to walk in hospital room?: A Little Help needed climbing 3-5 steps with a railing? : A Lot 6 Click Score: 17    End of Session Equipment Utilized During Treatment: Gait belt Activity Tolerance: Patient limited by fatigue Patient left: with call bell/phone within reach;in bed;with family/visitor present;with bed alarm set Nurse Communication: Mobility status PT Visit Diagnosis: Difficulty in walking, not elsewhere classified (R26.2);Muscle weakness (generalized) (M62.81)     Time: 8446-8391 PT Time Calculation (min) (ACUTE ONLY): 15 min  Charges:    $Therapeutic Activity: 8-22 mins PT General Charges $$ ACUTE PT VISIT: 1 Visit                     Toya HAMS , PTA Acute Rehabilitation Services Office [phone]    Toya JINNY Gosling 08/30/2023, 4:16 PM

## 2023-08-31 DIAGNOSIS — Z8673 Personal history of transient ischemic attack (TIA), and cerebral infarction without residual deficits: Secondary | ICD-10-CM | POA: Diagnosis not present

## 2023-08-31 DIAGNOSIS — C7951 Secondary malignant neoplasm of bone: Secondary | ICD-10-CM | POA: Diagnosis not present

## 2023-08-31 DIAGNOSIS — Z7189 Other specified counseling: Secondary | ICD-10-CM | POA: Diagnosis not present

## 2023-08-31 DIAGNOSIS — Z515 Encounter for palliative care: Secondary | ICD-10-CM | POA: Diagnosis not present

## 2023-08-31 NOTE — TOC Progression Note (Addendum)
 Transition of Care [facility]) - Progression Note    Patient Details  Name: Patricia [name] MRN: [id number] Date of Birth: Mar 20, 1940  [facility] CM/SW Contact  Toy LITTIE Agar, RN Phone [phone]  08/31/2023, 8:59 AM  Clinical Narrative:    CM spoke with Randine at [facility] awaiting determination if facility can offer bed.   1128 CM overheard patients husband leaving a message about placement for Dr. Clayton. Cm introduced self and explained TOC role in patient disposition planning. CM has made husband aware that [facility] has not given an answer for acceptance. CM made husband aware that we will need to work on a more definite plan. CM has expanded search and provided patient with a list of facilities and medicare.gov listing for star ratings. CM will follow up for bed offers and follow up with husband.   1235 Husband is agreeable to referral for [facility] in order to assist with navigating through his long term care policy and assisting with long term care placement. Referral has been called to [facility]. Info has been faxed.    Expected Discharge Plan: Long Term Nursing Home Barriers to Discharge: Continued Medical Work up, Other (must enter comment) (Requires long term care)  Expected Discharge Plan and Services In-house Referral: NA Discharge Planning Services: CM Consult Post Acute Care Choice: NA Living arrangements for the past 2 months: Single Family Home                 DME Arranged: N/A DME Agency: NA       HH Arranged: NA HH Agency: NA         Social Determinants of Health (SDOH) Interventions SDOH Screenings   Food Insecurity: No Food Insecurity (08/21/2023)  Housing: Low Risk  (08/21/2023)  Transportation Needs: No Transportation Needs (08/21/2023)  Utilities: Not At Risk (08/21/2023)  Alcohol Screen: Low Risk  (07/30/2023)  Depression (PHQ2-9): Medium Risk (08/03/2023)  Financial Resource Strain: Low Risk  (07/30/2023)  Physical Activity:  Inactive (07/30/2023)  Social Connections: Moderately Integrated (08/21/2023)  Stress: No Stress Concern Present (07/30/2023)  Tobacco Use: Medium Risk (08/20/2023)  Health Literacy: Adequate Health Literacy (10/29/2022)    Readmission Risk Interventions    08/25/2023    2:47 PM  Readmission Risk Prevention Plan  Transportation Screening Complete  PCP or Specialist Appt within 3-5 Days Complete  HRI or Home Care Consult Complete  Social Work Consult for Recovery Care Planning/Counseling Complete  Palliative Care Screening Complete  Medication Review Oceanographer) Complete

## 2023-08-31 NOTE — Progress Notes (Signed)
 Daily Progress Note   Patient Name: Patricia [name]       Date: 08/31/2023 DOB: [birth year]  Age: 83 y.o. MRN#: [id number] Attending Physician: Lue Elsie BROCKS, MD Primary Care Physician: Gayle Saddie JULIANNA DEVONNA Admit Date: 08/20/2023 Length of Stay: 9 days  Reason for Consultation/Follow-up: Establishing goals of care  Subjective:   Reviewed recent EMR including documentation from hospitalist and TOC.  Review of I&O noted decreased oral input.  Reviewed last oncology note discussing that patient would not be a candidate for further therapy due to her decline secondary to dementia.  TOC has been assisting with referral for long-term care placement.  Presents to bedside to see patient.  Patient's husband present at bedside.  Patient's husband remembered this provider from prior conversations.  Patient lying comfortably in bed without noted pain.   Patient has memory loss in setting of dementia so able to speak to patient's husband privately outside of room as to not disturb the patient.  Discussed care planning moving forward.  Husband acknowledges patient's continued deterioration with minimal oral intake and sleeping more during the day.  Husband noted hide plan was for a PET scan so reviewed if patient is not a candidate for further cancer directed therapies, further imaging would not be beneficial to planning.  Husband acknowledges.  Discussed hope for care moving forward.  Husband still wants patient to have long-term care placement since he cannot care for her at home.  Discussed addition of hospice with this in the care they would help in providing to focus on end-of-life care.  Husband acknowledged this.  Husband again confirmed that he is a friend who has hospice and greatly appreciates their support.  Husband to reach out to his friend about which hospice agency they use.  Also noted TOC would provide choice.  Discussed involving [facility] to assist with coordinating long-term care placement  since patient and husband have long-term care policy.  Husband agreeing with this plan.  Spent time providing emotional support via active listening.  All questions answered at that time.  Discussed care with hospitalist, RN, and TOC to coordinate plan for patient to go to facility with hospice support.  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 Oncology consulted for recommendations. Palliative medicine team consulted to assist with complex medical decision making.   Recommendations/Plan: # Complex medical decision making/goals of care:     -Patient unable to participate in complex medical decision-making due to underlying medical status.  Patient has underlying dementia.      - Discussed care with patient's husband as detailed above in HPI.  Patient is not a candidate for further cancer directed therapies due to her debilitated state with worsening symptoms of dementia including increased sleeping throughout  the day, poor functional status, and decreased oral intake.  Discussed seeking long-term care with hospice support, husband supporting of this.  TOC assisting with coordination of care.  Palliative medicine team will continue  to follow along to engage in conversations as able and appropriate.                -  Code Status: Limited: Do not attempt resuscitation (DNR) -DNR-LIMITED -Do Not Intubate/DNI     # Psycho-social/Spiritual Support:  - Support System: Husband, son, daughter   # Discharge Planning: Seeking long-term care placement at facility with hospice support  Discussed with: Hospitalist, TOC, RN, patient's husband, patient  Thank you for allowing the palliative care team to participate in the care Ronal Primrose.  Tinnie Radar, DO Palliative Care Provider PMT # [phone]  If patient remains symptomatic despite maximum doses, please call PMT at [phone] between 0700 and 1900. Outside of these hours, please call attending, as PMT does not have night coverage.  Personally spent 40 minutes in patient care including extensive chart review (labs, imaging, progress/consult notes, vital signs), medically appropraite exam, discussed with treatment team, education to patient, family, and staff, documenting clinical information, medication review and management, coordination of care, and available advanced directive documents.

## 2023-08-31 NOTE — Progress Notes (Signed)
 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  Per spouse, patient has had poor appetite and weight loss over the last few weeks.  She was seen by her oncologist 2 weeks ago and was started on Marinol  and Megace  to help with appetite. Over the last few days, patient had been significantly weak and had difficulty getting in bed. UA at intake concerning for UTI. Hospitalist called for admission.  Patient remains medically stable for discharge once safe disposition is obtained. 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   Patient continues to decline - taking PO poorly even with assistance, concern this is progression of disease complicated by advanced dementia - as we continue to focus on comfort would recommend against lab draws, procedures, repeat imaging, aggressive therapy unless the purpose/goal is to improve overall comfort/quality of life. Defer to oncology/radiation for ongoing testing/treatment per their expertise. Labs x1 am 6/30 in preparation for possible PET scan.   Assessment & Plan:   Principal Problem:   Generalized weakness Active Problems:   Hyperlipidemia   Essential hypertension   History of CVA (cerebrovascular accident)   Anemia associated with chemotherapy   Thrombocytopenia (HCC)   Breast cancer metastasized to bone, unspecified laterality (HCC)   UTI (urinary tract infection)   Pancytopenia (HCC)   DNR (do not resuscitate)   Dementia (HCC)    Weakness generalized   Counseling and coordination of care   Goals of care, counseling/discussion   Palliative care encounter   Metastatic malignant neoplasm (HCC)  UTI, acute cystitis - Complicated by immunocompromised state - 5-day antibiotic course completed 6/25 - Blood cultures negative so far, urine culture with multiple species  Generalized weakness, FTT - In the setting of advanced metastatic breast CA, stage IV  - Complicated by worsening pancytopenia/UTI - Transfused 1 unit packed RBCs on 6/21, H&H stable - Continue Marinol , Megace , fall precautions - Palliative medicine, oncology following  Goals of care  - Lengthy discussion with patient and her husband, again attempted to discuss prognosis/goals of care. - Patient's husband is fixated on repeat PET scan despite our discussion that included Dr Jessy note indicating there is no 'further therapy for her' - At this time I would not recommend any further invasive testing and would focus on patient's comfort and quality of life  - I appreciate oncology and palliative care assistance with this conversation.  Right hip pain in the setting of metastatic breast CA, metastasis - Continue pain control  - Right hip x-ray shows sclerotic lesions, no obvious lytic lesion or bony destruction.  CT chest abdomen pelvis on 6/22 showed scattered sclerotic lesions in the bones slightly increased from 2024, no pathologic fractures. Would not repeat imaging given limited options for intervention at this time.   Pancytopenia with mild anemia, thrombocytopenia - Hemoglobin 9.9 on admission and received 1 unit packed RBCs - repeat H&H > 11 - Avoid further blood products without overt symptoms or <8 given risk vs benefit   Metastatic breast cancer, Osseous metastases - Continue gabapentin ,  Megace , Marinol , as needed tramadol   - CT chest abdomen pelvis on 6/22 showed scattered sclerotic lesions in the bones slightly increased from 2024, no  pathologic fractures, anasarca with moderate ascites, loculated moderate left pleural effusion - US  thoracentesis, likely malignant.  Currently no hypoxia or abdominal pain, hold off on paracentesis - Oncology, palliative medicine following, recommended DNR.   Left moderate pleural effusion, also chronic pleural effusion - Likely secondary to breast malignancy - CT chest abdomen pelvis showed moderate loculated left pleural effusion and small right pleural effusion, emphysema, moderate ascites and anasarca - US  guided left thoracentesis on 6/23, 720 cc removed   Atrial fibrillation, rate controlled - HR stable, continue Lopressor  - Hold Eliquis  in the setting of thrombocytopenia    Chronic diastolic HF - Continue on digoxin , Lasix  and Lopressor  - Echo showed EF 60-65%, no regional WMA, left ventricular diastolic function could not be evaluated, RV SF, massive biatrial dilation suspect secondary to underlying A-fib but could consider workup for infiltrative cardiomyopathy, left atrial size severely dilated, moderate AR - Cardiology onsulted   Hypophosphatemia - Continue replacement HLD - Continue atorvastatin  Hypothyroidism - Continue Armour - TSH 5.9 Seizure disorder - Continue Keppra  GERD - Continue Protonix  Dementia - Continue Aricept  and memantine  Asthma - No wheezing, continue home bronchodilators  DVT prophylaxis: SCDs Start: 08/21/23 0809   Code Status:   Code Status: Limited: Do not attempt resuscitation (DNR) -DNR-LIMITED -Do Not Intubate/DNI  Family Communication: Husband at bedside Status is: Inpatient  Dispo: The patient is from: Home              Anticipated d/c is to: To be determined, ?SNF              Anticipated d/c date is: Pending safe dispo              Patient currently is medically stable for discharge  Consultants:  Oncology, palliative care  Procedures:  Thoracentesis  Antimicrobials:  Ceftriaxone  stop date 08/25/2023  Subjective: No acute issues  or events reported overnight, ROS limited by patient's baseline mental status  Objective: Vitals:   08/30/23 0836 08/30/23 1329 08/30/23 2011 08/31/23 0545  BP:  119/68 (!) 110/54 110/75  Pulse:  90 83 91  Resp:  19 18 18   Temp:  98.8 F (37.1 C) 98.3 F (36.8 C) 98.1 F (36.7 C)  TempSrc:  Oral Oral Oral  SpO2: 97% 94% 92% 97%  Weight:      Height:        Intake/Output Summary (Last 24 hours) at 08/31/2023 0757 Last data filed at 08/30/2023 1742 Gross per 24 hour  Intake 120 ml  Output --  Net 120 ml    Filed Weights   08/28/23 0500 08/29/23 0623 08/30/23 0507  Weight: 51.6 kg 51.6 kg 53.2 kg    Examination:  General:  Pleasantly resting in bed, No acute distress.  Somnolent but easily arousable, oriented to person HEENT:  Normocephalic atraumatic.  Sclerae nonicteric, noninjected.  Extraocular movements intact bilaterally. Neck:  Without mass or deformity.  Trachea is midline. Lungs:  Clear to auscultate bilaterally without rhonchi, wheeze, or rales. Heart:  Regular rate and rhythm.  Without murmurs, rubs, or gallops. Abdomen:  Soft, nontender, mildly distended.  Without guarding or rebound. Extremities: Without cyanosis, clubbing, 1+ pitting edema, or obvious deformity. Skin:  Warm and dry, no erythema.  Data Reviewed: I have personally reviewed following labs and imaging studies  CBC: Recent Labs  Lab 08/24/23 1045 08/25/23 0500 08/30/23  0500  WBC 3.7* 3.8* 1.9*  NEUTROABS 3.1 3.2 1.4*  HGB 12.9 11.7* 10.9*  HCT 40.0 34.8* 32.7*  MCV 103.4* 99.4 101.9*  PLT 81* 98* 77*   Basic Metabolic Panel: Recent Labs  Lab 08/24/23 1045 08/25/23 0500 08/30/23 0500  NA 140 137 139  K 3.0* 3.9 3.2*  CL 109 107 99  CO2 22 22 29   GLUCOSE 85 126* 106*  BUN 15 20 26*  CREATININE 0.70 0.68 0.66  CALCIUM  7.4* 8.2* 8.1*   GFR: Estimated Creatinine Clearance: 38.9 mL/min (by C-G formula based on SCr of 0.66 mg/dL). Liver Function Tests: Recent Labs  Lab  08/24/23 1045 08/25/23 0500  AST 56* 55*  ALT 43 44  ALKPHOS 59 73  BILITOT 0.9 0.7  PROT 5.2* 5.3*  ALBUMIN  2.6* 2.5*   No results for input(s): LIPASE, AMYLASE in the last 168 hours.   Recent Results (from the past 240 hours)  Urine Culture (for pregnant, neutropenic or urologic patients or patients with an indwelling urinary catheter)     Status: Abnormal   Collection Time: 08/21/23  6:28 PM   Specimen: Urine, Clean Catch  Result Value Ref Range Status   Specimen Description   Final    URINE, CLEAN CATCH Performed at [facility], [address] [place], [place] [address]    Special Requests   Final    NONE Performed at [facility], [address] [place], [place] [address]    Culture MULTIPLE SPECIES PRESENT, SUGGEST RECOLLECTION (A)  Final   Report Status 08/24/2023 FINAL  Final  Culture, blood (Routine X 2) w Reflex to ID Panel     Status: None   Collection Time: 08/21/23  7:49 PM   Specimen: BLOOD RIGHT HAND  Result Value Ref Range Status   Specimen Description BLOOD RIGHT HAND  Final   Special Requests AEROBIC BOTTLE ONLY Blood Culture adequate volume  Final   Culture   Final    NO GROWTH 5 DAYS Performed at [facility], [address] [place], [place] [address]    Report Status 08/27/2023 FINAL  Final  Culture, blood (Routine X 2) w Reflex to ID Panel     Status: None   Collection Time: 08/21/23  7:49 PM   Specimen: BLOOD RIGHT HAND  Result Value Ref Range Status   Specimen Description BLOOD RIGHT HAND  Final   Special Requests AEROBIC BOTTLE ONLY Blood Culture adequate volume  Final   Culture   Final    NO GROWTH 5 DAYS Performed at [facility], [address], [place], [place] [address]    Report Status 08/27/2023 FINAL  Final  C Difficile Quick Screen w PCR reflex     Status: None   Collection Time: 08/22/23  3:57 AM   Specimen: STOOL  Result Value Ref Range Status   C Diff antigen NEGATIVE  NEGATIVE Final   C Diff toxin NEGATIVE NEGATIVE Final   C Diff interpretation No C. difficile detected.  Final    Comment: Performed at [facility], [address] [place], [place] [address]  Gastrointestinal Panel by PCR , Stool     Status: None   Collection Time: 08/22/23  3:57 AM   Specimen: Stool  Result Value Ref Range Status   Campylobacter species NOT DETECTED NOT DETECTED Final   Plesimonas shigelloides NOT DETECTED NOT DETECTED Final   Salmonella species NOT DETECTED NOT DETECTED Final   Yersinia  enterocolitica NOT DETECTED NOT DETECTED Final   Vibrio species NOT DETECTED NOT DETECTED Final   Vibrio cholerae NOT DETECTED NOT DETECTED Final   Enteroaggregative E coli (EAEC) NOT DETECTED NOT DETECTED Final   Enteropathogenic E coli (EPEC) NOT DETECTED NOT DETECTED Final   Enterotoxigenic E coli (ETEC) NOT DETECTED NOT DETECTED Final   Shiga like toxin producing E coli (STEC) NOT DETECTED NOT DETECTED Final   Shigella/Enteroinvasive E coli (EIEC) NOT DETECTED NOT DETECTED Final   Cryptosporidium NOT DETECTED NOT DETECTED Final   Cyclospora cayetanensis NOT DETECTED NOT DETECTED Final   Entamoeba histolytica NOT DETECTED NOT DETECTED Final   Giardia lamblia NOT DETECTED NOT DETECTED Final   Adenovirus F40/41 NOT DETECTED NOT DETECTED Final   Astrovirus NOT DETECTED NOT DETECTED Final   Norovirus GI/GII NOT DETECTED NOT DETECTED Final   Rotavirus A NOT DETECTED NOT DETECTED Final   Sapovirus (I, II, IV, and V) NOT DETECTED NOT DETECTED Final    Comment: Performed at [facility], [address] [place], [place] [address]  Culture, body fluid w Gram Stain-bottle     Status: None   Collection Time: 08/23/23  2:32 PM   Specimen: Fluid  Result Value Ref Range Status   Specimen Description FLUID PLEURAL  Final   Special Requests BOTTLES DRAWN AEROBIC AND ANAEROBIC  Final   Culture   Final    NO GROWTH 5 DAYS Performed at [facility],  [address], [place], [place] [address]    Report Status 08/28/2023 FINAL  Final  Gram stain     Status: None   Collection Time: 08/23/23  2:32 PM   Specimen: Fluid  Result Value Ref Range Status   Specimen Description FLUID PLEURAL  Final   Special Requests NONE  Final   Gram Stain   Final    WBC PRESENT,BOTH PMN AND MONONUCLEAR NO ORGANISMS SEEN CYTOSPIN SMEAR Performed at [facility], [address] [place], [place] [address]    Report Status 08/25/2023 FINAL  Final    Radiology Studies: No results found.  Scheduled Meds:  atorvastatin   40 mg Oral QPM   B-complex with vitamin C  1 tablet Oral Daily   calcium -vitamin D   1 tablet Oral Q breakfast   Chlorhexidine  Gluconate Cloth  6 each Topical Daily   cholecalciferol   2,000 Units Oral Daily   digoxin   0.125 mg Oral Daily   donepezil   5 mg Oral QHS   dronabinol   5 mg Oral BID AC   feeding supplement  237 mL Oral TID BM   fluticasone   1 spray Each Nare BID   fluticasone  furoate-vilanterol  1 puff Inhalation Daily   folic acid   500 mcg Oral Daily   furosemide   40 mg Oral BID   gabapentin   400 mg Oral BID   latanoprost   1 drop Both Eyes BID   levETIRAcetam   500 mg Oral QHS   loratadine   10 mg Oral Daily   megestrol   400 mg Oral BID   memantine   10 mg Oral BID   multivitamins with iron   1 tablet Oral Daily   pantoprazole   40 mg Oral Daily   sodium chloride  flush  10-40 mL Intracatheter Q12H   spironolactone   25 mg Oral Daily   thyroid   90 mg Oral Daily   Continuous Infusions:     LOS: 9 days   Time spent:  Elsie JAYSON Montclair, DO [facility]  If 7PM-7AM, please contact night-coverage  [email or web address]  08/31/2023, 7:57 AM

## 2023-09-01 DIAGNOSIS — Z515 Encounter for palliative care: Secondary | ICD-10-CM | POA: Diagnosis not present

## 2023-09-01 DIAGNOSIS — Z79899 Other long term (current) drug therapy: Secondary | ICD-10-CM

## 2023-09-01 DIAGNOSIS — Z7189 Other specified counseling: Secondary | ICD-10-CM

## 2023-09-01 DIAGNOSIS — C7951 Secondary malignant neoplasm of bone: Secondary | ICD-10-CM | POA: Diagnosis not present

## 2023-09-01 DIAGNOSIS — R531 Weakness: Secondary | ICD-10-CM | POA: Diagnosis not present

## 2023-09-01 DIAGNOSIS — R52 Pain, unspecified: Secondary | ICD-10-CM

## 2023-09-01 DIAGNOSIS — F0390 Unspecified dementia without behavioral disturbance: Secondary | ICD-10-CM | POA: Diagnosis not present

## 2023-09-01 DIAGNOSIS — Z711 Person with feared health complaint in whom no diagnosis is made: Secondary | ICD-10-CM

## 2023-09-01 DIAGNOSIS — Z8673 Personal history of transient ischemic attack (TIA), and cerebral infarction without residual deficits: Secondary | ICD-10-CM | POA: Diagnosis not present

## 2023-09-01 MED ORDER — GLYCOPYRROLATE 0.2 MG/ML IJ SOLN: 0.2000 mg | INTRAMUSCULAR | Status: DC | PRN

## 2023-09-01 MED ORDER — HALOPERIDOL LACTATE 5 MG/ML IJ SOLN: 0.5000 mg | INTRAMUSCULAR | Status: DC | PRN

## 2023-09-01 MED ORDER — HALOPERIDOL LACTATE 2 MG/ML PO CONC: 0.5000 mg | ORAL | Status: DC | PRN

## 2023-09-01 MED ORDER — BIOTENE DRY MOUTH MT LIQD
15.0000 mL | OROMUCOSAL | Status: DC | PRN
Start: 2023-09-01 — End: 2023-09-09

## 2023-09-01 MED ORDER — HALOPERIDOL 0.5 MG PO TABS
0.5000 mg | ORAL_TABLET | ORAL | Status: DC | PRN
  Administered 2023-09-04 – 2023-09-08 (×4): 0.5 mg via ORAL
  Filled 2023-09-01 (×4): qty 1

## 2023-09-01 MED ORDER — GLYCOPYRROLATE 1 MG PO TABS
1.0000 mg | ORAL_TABLET | ORAL | Status: DC | PRN
Start: 2023-09-01 — End: 2023-09-09

## 2023-09-01 MED ORDER — POLYVINYL ALCOHOL 1.4 % OP SOLN: 1.0000 [drp] | Freq: Four times a day (QID) | OPHTHALMIC | Status: DC | PRN

## 2023-09-01 MED ORDER — MORPHINE SULFATE (PF) 2 MG/ML IV SOLN: 1.0000 mg | INTRAVENOUS | Status: DC | PRN

## 2023-09-01 MED ORDER — TRAMADOL HCL 50 MG PO TABS
50.0000 mg | ORAL_TABLET | Freq: Four times a day (QID) | ORAL | Status: DC | PRN
  Administered 2023-09-04 – 2023-09-08 (×4): 50 mg via ORAL
  Filled 2023-09-01 (×4): qty 1

## 2023-09-01 NOTE — Progress Notes (Addendum)
 Daily Progress Note   Patient Name: Patricia [name]       Date: 09/01/2023 DOB: Feb 26, 1941  Age: 83 y.o. MRN#: [id number] Attending Physician: Leotis Bogus, MD Primary Care Physician: Gayle Saddie JULIANNA DEVONNA Admit Date: 08/20/2023 Length of Stay: 10 days  Reason for Consultation/Follow-up: Establishing goals of care  Subjective:   Reviewed EMR prior to presenting to bedside.  Patient refused multiple oral medications this morning.  Discussed care with RN for updates who noted patient very lethargic today. Reviewed recent documentation from hospitalist and TOC.  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 Individuals present for the conversation: Patient unable to participate in complex medical decision-making due to medical status.  Discussed care with patient's husband at bedside.  Summary of the conversation:  Presented to bedside to see patient.  Patient lying in bed sleeping.  Patient will awaken though only minimally and moan at times.  Spoke to patient's husband at bedside.  Husband noted that [facility] and [employer] [facility] assisting with long-term care placement.  Husband reached out to his friend who has hospice and noted that they have [facility] so he would like referral to [facility] specifically regarding hospice care.  Acknowledged and noted would involve TOC to assist.  Discussed transitioning to full comfort focused care at this time with patient's lack of involvement, difficulties taking oral medications, and increased lethargy.  Husband agreeing with this plan.  Noted would ask [facility] to evaluate for possible  inpatient hospice in case patient continues to further deteriorate.  If patient not appropriate, still seeking long-term care with hospice support at the same time.  Husband agreeing with this plan.  Outcome of the conversations and/or documents completed:  Transition to full comfort focused care at this time.  Involving [facility] for hospice evaluation.  Considering hospice and long-term care facility versus inpatient hospice if continues to deteriorate rapidly.  I spent 25 minutes providing separately identifiable ACP services with the patient and/or surrogate decision maker in a voluntary, in-person conversation discussing the patient's wishes and goals as detailed in the above note.  Tinnie Radar, DO Palliative Medicine Provider  -------------------------------------------------------------------------------------------------------------  Spent time providing emotional support via active listening.  All question answered at that time.  Discussed care with hospitalist, RN, TOC, and [facility] liaison to coordinate care.  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 Oncology consulted for recommendations.  Palliative medicine team consulted to assist with complex medical decision making.   Recommendations/Plan: # Complex medical decision making/goals of care:     -  Patient unable to participate in complex medical decision-making due to underlying medical status.  Patient has underlying dementia.      - Discussed care with patient's husband as detailed above in HPI.  Patient is not a candidate for further cancer directed therapies due to her debilitated state with worsening symptoms of dementia including increased sleeping throughout the day, poor functional status, and decreased oral intake.  Husband requesting evaluation by [facility] for hospice support.  Noted would transition to comfort focused care at this time and have [facility] evaluated for possible inpatient hospice versus long-term care with hospice support.  Husband agreeing with this plan.               -At this time we will discontinue interventions that are no longer focused on comfort such as IV fluids, imaging, or lab work.  Will instead focus on symptom management of pain, dyspnea, and agitation in the setting of end-of-life care.    Code Status: Do not attempt resuscitation (DNR) - Comfort care  # Symptom management Patient is receiving these palliative interventions for symptom management with an intent to improve quality of life.     -Pain/Dyspnea, acute in the setting of end-of-life care   - Continue tramadol  50 mg every 6 hours as needed   - Start IV morphine  1-2 mg every 4 hours as needed breakthrough only after given oral tramadol     - Continue gabapentin  for 100 mg twice daily                 -Anxiety/agitation, in the setting of end-of-life care                               -Start as needed Haldol, continue to adjust based on patient's symptom burden                  -Secretions, in the setting of end-of-life care                               -Start as needed glycopyrrolate , continue to adjust based on patient's symptom  burden  # Psycho-social/Spiritual Support:  - Support System: Husband, son, daughter   # Discharge Planning: Transition to comfort focused care today.  [facility] to evaluate for hospice support at inpatient hospice versus long-term care facility with hospice.  Discussed with: Hospitalist, TOC, RN, patient's husband, [facility] liaison  Thank you for allowing the palliative care team to participate in the care Ronal Primrose.  Tinnie Radar, DO Palliative Care Provider PMT # [phone]  If patient remains symptomatic despite maximum doses, please call PMT at [phone] between 0700 and 1900. Outside of these hours, please call attending, as PMT does not have night coverage.  Billing based on MDM: High Problems Addressed: One acute or chronic illness or injury that poses a threat to life or bodily function  Risks: Parenteral controlled substances

## 2023-09-01 NOTE — Progress Notes (Signed)
 [facility] Liaison RN note Received request from [facility] for hospice services at [facility] vs home after discharge. Chart and patient information under review by Hospice physician. Hospice eligibility pending at this time.   Spoke with husband at bedside to initiate education related to hospice philosophy, services, and team approach to care. Patient/family verbalized understanding of information given.   Per discussion, the plan is for discharge to long term care facility, husband is hopeful for [facility].   DME needs discussed. N/A  Please send signed and completed DNR with patient/family. Please provide prescriptions at discharge as needed for ongoing symptom management.   Please call with any hospice related questions or concerns.  Thank you for the opportunity to participate in this patient's care.  Hunter Seip, Charity fundraiser, BSN [employer] [phone]

## 2023-09-01 NOTE — Progress Notes (Signed)
 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  Per spouse, patient has had poor appetite and weight loss over the last few weeks.  She was seen by her oncologist 2 weeks ago and was started on Marinol  and Megace  to help with appetite. Over the last few days, patient had been significantly weak and had difficulty getting in bed. UA at intake concerning for UTI. Hospitalist called for admission.   Patient remains medically stable for discharge once safe disposition is obtained. 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    Patient continues to decline - taking PO poorly even with assistance, concern this is progression of disease complicated by advanced dementia - as we continue to focus on comfort , would recommend against lab draws, procedures, repeat imaging, aggressive therapy unless the purpose/goal is to improve overall comfort/quality of life. Defer to oncology/radiation for ongoing testing/treatment per their expertise. Labs x1 am 6/30 in preparation for possible PET scan.  Assessment & Plan:   Principal Problem:   Generalized weakness Active Problems:   Hyperlipidemia   Essential hypertension   History of CVA (cerebrovascular accident)   Anemia associated with chemotherapy   Thrombocytopenia (HCC)   Breast cancer metastasized to bone, unspecified laterality (HCC)   UTI (urinary tract infection)   Pancytopenia (HCC)   DNR (do not resuscitate)   Dementia (HCC)    Weakness generalized   Counseling and coordination of care   Goals of care, counseling/discussion   Palliative care encounter   Metastatic malignant neoplasm (HCC)   UTI, acute cystitis: Complicated by immunocompromised state. - 5-day antibiotics course completed 6/25. - Blood cultures negative so far, urine culture with multiple species.   Generalized weakness, FTT - In the setting of advanced metastatic breast CA, stage IV . - Complicated by worsening pancytopenia/UTI - Transfused 1 unit packed RBCs on 6/21, H&H stable. - Continue Marinol , Megace , fall precautions - Palliative medicine, oncology following.   Goals of care  - Lengthy discussion with patient and her husband, again attempted to discuss prognosis/goals of care. - Patient's husband is fixated on repeat PET scan despite our discussion that included Dr Jessy note indicating there is no 'further therapy for her'. - At this time I would not recommend any further invasive testing and would focus on patient's comfort and quality of life  - I appreciate oncology and palliative care assistance with this conversation. Palliative care has changed the care to comfort care.   Right hip pain in the setting of metastatic breast CA, metastasis: - Continue pain control. - Right hip x-ray shows sclerotic lesions, no obvious lytic lesion or bony destruction.  CT chest abdomen pelvis on 6/22 showed scattered sclerotic lesions in the bones slightly increased from 2024, no pathologic fractures. Would not repeat imaging given limited options for intervention at this time.   Pancytopenia with mild anemia, thrombocytopenia - Hemoglobin 9.9 on admission and received 1 unit packed RBCs - repeat H&H > 11 - Avoid further blood products without overt symptoms or <8 given risk vs  benefits.   Metastatic breast cancer, Osseous metastases - Continue gabapentin , Megace , Marinol , as needed tramadol   - CT chest abdomen pelvis on 6/22 showed  scattered sclerotic lesions in the bones slightly increased from 2024, no pathologic fractures, anasarca with moderate ascites, loculated moderate left pleural effusion - US  thoracentesis, likely malignant.  Currently no hypoxia or abdominal pain, hold off on paracentesis. - Oncology, palliative medicine following, recommended DNR.   Left moderate pleural effusion, also chronic pleural effusion - Likely secondary to breast malignancy - CT chest abdomen pelvis showed moderate loculated left pleural effusion and small right pleural effusion, emphysema, moderate ascites and anasarca - US  guided left thoracentesis on 6/23, 720 cc removed.   Atrial fibrillation, rate controlled: - HR stable, continue Lopressor . - Hold Eliquis  in the setting of thrombocytopenia    Chronic diastolic HF - Continue on digoxin , Lasix  and Lopressor  - Echo showed EF 60-65%, no regional WMA, left ventricular diastolic function could not be evaluated, RV SF, massive biatrial dilation suspect secondary to underlying A-fib but could consider workup for infiltrative cardiomyopathy, left atrial size severely dilated, moderate AR - Cardiology consulted   Hypophosphatemia - Continue replacement.  HLD - Continue atorvastatin .  Hypothyroidism - Continue Armour - TSH 5.9  Seizure disorder - Continue Keppra   GERD - Continue Protonix .  Dementia - Continue Aricept  and memantine .  Asthma - No wheezing, continue home bronchodilators.   DVT prophylaxis: SCDs Code Status: DNR-Comfort care Family Communication: No family at bed side Disposition Plan:    Status is: Inpatient Remains inpatient appropriate because: Inpatient severity of illness     Consultants:  Palliative care  Procedures:  Antimicrobials:  Anti-infectives (From admission, onward)    Start     Dose/Rate Route Frequency Ordered Stop   08/21/23 2000  cefTRIAXone  (ROCEPHIN ) 1 g in sodium chloride  0.9 % 100 mL IVPB        1 g 200 mL/hr over 30  Minutes Intravenous Every 24 hours 08/21/23 1821 08/25/23 2200       Subjective: Patient was seen and examined at bedside.  Overnight events noted. Patient appears comfortable,  very deconditioned,sick looking, partially following commands..  Objective: Vitals:   08/31/23 2018 09/01/23 0536 09/01/23 0956 09/01/23 1000  BP: 117/67 117/76    Pulse: 78 95 86   Resp: 18 18    Temp: 98.6 F (37 C) 98.2 F (36.8 C)    TempSrc: Oral Oral    SpO2: 95% 93% 91% 92%  Weight:      Height:        Intake/Output Summary (Last 24 hours) at 09/01/2023 1311 Last data filed at 09/01/2023 1000 Gross per 24 hour  Intake 0 ml  Output --  Net 0 ml   Filed Weights   08/28/23 0500 08/29/23 0623 08/30/23 0507  Weight: 51.6 kg 51.6 kg 53.2 kg    Examination:  General exam: Appears calm and comfortable, severely deconditioned,  not in acute distress Respiratory system: CTA Bilaterally . Respiratory effort normal. RR 14 Cardiovascular system: S1 & S2 heard, . No JVD, murmurs, rubs, gallops or clicks. No pedal edema. Gastrointestinal system: Abdomen is non distended, soft and non tender.  Normal bowel sounds heard. Central nervous system: Confused, not following commands. No focal neurological deficits. Extremities: No edema, no cyanosis, no clubbing Skin: No rashes, lesions or ulcers Psychiatry: Mood & affect appropriate.    Data Reviewed: I have personally reviewed following labs and imaging studies  CBC: Recent Labs  Lab 08/30/23 0500  WBC  1.9*  NEUTROABS 1.4*  HGB 10.9*  HCT 32.7*  MCV 101.9*  PLT 77*   Basic Metabolic Panel: Recent Labs  Lab 08/30/23 0500  NA 139  K 3.2*  CL 99  CO2 29  GLUCOSE 106*  BUN 26*  CREATININE 0.66  CALCIUM  8.1*   GFR: Estimated Creatinine Clearance: 38.9 mL/min (by C-G formula based on SCr of 0.66 mg/dL). Liver Function Tests: No results for input(s): AST, ALT, ALKPHOS, BILITOT, PROT, ALBUMIN  in the last 168 hours. No results for  input(s): LIPASE, AMYLASE in the last 168 hours. No results for input(s): AMMONIA in the last 168 hours. Coagulation Profile: No results for input(s): INR, PROTIME in the last 168 hours. Cardiac Enzymes: No results for input(s): CKTOTAL, CKMB, CKMBINDEX, TROPONINI in the last 168 hours. BNP (last 3 results) No results for input(s): PROBNP in the last 8760 hours. HbA1C: No results for input(s): HGBA1C in the last 72 hours. CBG: No results for input(s): GLUCAP in the last 168 hours. Lipid Profile: No results for input(s): CHOL, HDL, LDLCALC, TRIG, CHOLHDL, LDLDIRECT in the last 72 hours. Thyroid  Function Tests: No results for input(s): TSH, T4TOTAL, FREET4, T3FREE, THYROIDAB in the last 72 hours. Anemia Panel: No results for input(s): VITAMINB12, FOLATE, FERRITIN, TIBC, IRON , RETICCTPCT in the last 72 hours. Sepsis Labs: No results for input(s): PROCALCITON, LATICACIDVEN in the last 168 hours.  Recent Results (from the past 240 hours)  Culture, body fluid w Gram Stain-bottle     Status: None   Collection Time: 08/23/23  2:32 PM   Specimen: Fluid  Result Value Ref Range Status   Specimen Description FLUID PLEURAL  Final   Special Requests BOTTLES DRAWN AEROBIC AND ANAEROBIC  Final   Culture   Final    NO GROWTH 5 DAYS Performed at [facility], [address], [place], [place] [address]    Report Status 08/28/2023 FINAL  Final  Gram stain     Status: None   Collection Time: 08/23/23  2:32 PM   Specimen: Fluid  Result Value Ref Range Status   Specimen Description FLUID PLEURAL  Final   Special Requests NONE  Final   Gram Stain   Final    WBC PRESENT,BOTH PMN AND MONONUCLEAR NO ORGANISMS SEEN CYTOSPIN SMEAR Performed at [facility], [address], [place], [place] [address]    Report Status 08/25/2023 FINAL  Final    Radiology Studies: No results found.  Scheduled Meds:  digoxin   0.125 mg Oral  Daily   dronabinol   5 mg Oral BID AC   feeding supplement  237 mL Oral TID BM   fluticasone   1 spray Each Nare BID   fluticasone  furoate-vilanterol  1 puff Inhalation Daily   furosemide   40 mg Oral BID   gabapentin   400 mg Oral BID   latanoprost   1 drop Both Eyes BID   levETIRAcetam   500 mg Oral QHS   loratadine   10 mg Oral Daily   pantoprazole   40 mg Oral Daily   sodium chloride  flush  10-40 mL Intracatheter Q12H   spironolactone   25 mg Oral Daily   thyroid   90 mg Oral Daily   Continuous Infusions:   LOS: 10 days    Time spent: 50 mins    Darcel Dawley, MD [facility]   If 7PM-7AM, please contact night-coverage

## 2023-09-02 ENCOUNTER — Ambulatory Visit

## 2023-09-02 ENCOUNTER — Ambulatory Visit: Admitting: Hematology & Oncology

## 2023-09-02 ENCOUNTER — Other Ambulatory Visit

## 2023-09-02 ENCOUNTER — Inpatient Hospital Stay

## 2023-09-02 DIAGNOSIS — R531 Weakness: Secondary | ICD-10-CM | POA: Diagnosis not present

## 2023-09-02 DIAGNOSIS — Z7189 Other specified counseling: Secondary | ICD-10-CM | POA: Diagnosis not present

## 2023-09-02 DIAGNOSIS — C50919 Malignant neoplasm of unspecified site of unspecified female breast: Secondary | ICD-10-CM | POA: Diagnosis not present

## 2023-09-02 DIAGNOSIS — Z515 Encounter for palliative care: Secondary | ICD-10-CM | POA: Diagnosis not present

## 2023-09-02 NOTE — Plan of Care (Signed)
  Problem: Education: Goal: Knowledge of General Education information will improve Description: Including pain rating scale, medication(s)/side effects and non-pharmacologic comfort measures Outcome: Not Progressing   Problem: Health Behavior/Discharge Planning: Goal: Ability to manage health-related needs will improve Outcome: Not Progressing   Problem: Clinical Measurements: Goal: Ability to maintain clinical measurements within normal limits will improve Outcome: Not Progressing Goal: Will remain free from infection Outcome: Not Progressing Goal: Diagnostic test results will improve Outcome: Not Progressing Goal: Respiratory complications will improve Outcome: Not Progressing Goal: Cardiovascular complication will be avoided Outcome: Not Progressing   Problem: Activity: Goal: Risk for activity intolerance will decrease Outcome: Not Progressing   Problem: Nutrition: Goal: Adequate nutrition will be maintained Outcome: Not Progressing   Problem: Coping: Goal: Level of anxiety will decrease Outcome: Not Progressing   Problem: Elimination: Goal: Will not experience complications related to bowel motility Outcome: Not Progressing Goal: Will not experience complications related to urinary retention Outcome: Not Progressing   Problem: Pain Managment: Goal: General experience of comfort will improve and/or be controlled Outcome: Not Progressing   Problem: Safety: Goal: Ability to remain free from injury will improve Outcome: Not Progressing   Problem: Skin Integrity: Goal: Risk for impaired skin integrity will decrease Outcome: Not Progressing   Problem: Education: Goal: Knowledge of the prescribed therapeutic regimen will improve Outcome: Not Progressing   Problem: Coping: Goal: Ability to identify and develop effective coping behavior will improve Outcome: Not Progressing   Problem: Clinical Measurements: Goal: Quality of life will improve Outcome: Not  Progressing   Problem: Respiratory: Goal: Verbalizations of increased ease of respirations will increase Outcome: Not Progressing   Problem: Role Relationship: Goal: Family's ability to cope with current situation will improve Outcome: Not Progressing Goal: Ability to verbalize concerns, feelings, and thoughts to partner or family member will improve Outcome: Not Progressing   Problem: Pain Management: Goal: Satisfaction with pain management regimen will improve Outcome: Not Progressing

## 2023-09-02 NOTE — Telephone Encounter (Signed)
 Last seen on 03/11/23 Follow up scheduled on 09/16/23

## 2023-09-02 NOTE — Progress Notes (Signed)
 Daily Progress Note   Patient Name: Patricia [name]       Date: 09/02/2023 DOB: [birth year]  Age: 83 y.o. MRN#: [id number] Attending Physician: Leotis Bogus, MD Primary Care Physician: Gayle Saddie JULIANNA DEVONNA Admit Date: 08/20/2023 Length of Stay: 11 days  Reason for Consultation/Follow-up: Establishing goals of care  Subjective:   Reviewed EMR prior to presenting to bedside.  Patient has not required any as needed medication for symptom management.  Patient able to tolerate taking oral medications this morning as per EMR documentation.  Presented to bedside to see patient.  Patient much more awake and alert today.  Husband present at bedside.  Husband noted that patient was able to eat a little bit of breakfast and enjoy some lunch.  Husband has talked to coordinator and patient is supposed to be evaluated at 2 PM today for possible long-term care at that [facility] with [facility] hospice support layered in.  Noted will await outcomes of this evaluation.  Spent time providing emotional support via active listening.  All questions answered at that time.  Thanked patient and husband for allowing me to visit with him today.  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 Oncology consulted for recommendations. Palliative medicine team consulted to assist with complex medical decision making.   Recommendations/Plan: # Complex medical decision making/goals of care:     -Patient unable to participate in complex medical decision-making due to underlying medical status.  Patient has underlying dementia.      - Discussed care with patient's husband as detailed above in HPI.  Patient is not a candidate for further cancer directed therapies due to her debilitated state with worsening symptoms of dementia including increased sleeping throughout the day, poor functional status, and decreased oral intake.  Patient transition to full comfort focused care on 7/2.  Seeking long-term care placement with addition of [facility].  Palliative medicine team continuing to follow along to engage in conversations as able and appropriate.               -Have discontinued interventions that are no longer focused on comfort such as IV fluids, imaging, or lab work.  Focused on symptom management of pain, dyspnea, and agitation in the setting of end-of-life care.    Code Status: Do not attempt resuscitation (DNR) - Comfort care  # Symptom management Patient is receiving these palliative interventions for symptom management with an intent to improve quality of life.     -Pain/Dyspnea, acute in the setting of end-of-life care   - Continue tramadol  50 mg every 6 hours as needed   - Continue IV morphine  1-2 mg every 4 hours as needed breakthrough only after given oral tramadol     -  Continue gabapentin  for 100 mg twice daily                 -Anxiety/agitation, in the setting of end-of-life care                               - Continue as needed Haldol, continue to adjust based on patient's symptom burden                  -Secretions, in the setting of end-of-life care                               - Continue as needed  glycopyrrolate , continue to adjust based on patient's symptom burden  # Psycho-social/Spiritual Support:  - Support System: Husband, son, daughter   # Discharge Planning: Seeking long-term care placement with [facility] layered in  Discussed with: Hospitalist, TOC, RN, patient's husband, [facility] liaison  Thank you for allowing the palliative care team to participate in the care Ronal Primrose.  Tinnie Radar, DO Palliative Care Provider PMT # [phone]  If patient remains symptomatic despite maximum doses, please call PMT at [phone] between 0700 and 1900. Outside of these hours, please call attending, as PMT does not have night coverage.

## 2023-09-02 NOTE — Plan of Care (Signed)
  Problem: Education: Goal: Knowledge of General Education information will improve Description: Including pain rating scale, medication(s)/side effects and non-pharmacologic comfort measures Outcome: Progressing   Problem: Health Behavior/Discharge Planning: Goal: Ability to manage health-related needs will improve Outcome: Progressing   Problem: Clinical Measurements: Goal: Ability to maintain clinical measurements within normal limits will improve Outcome: Progressing Goal: Will remain free from infection Outcome: Progressing Goal: Diagnostic test results will improve Outcome: Progressing Goal: Respiratory complications will improve Outcome: Progressing Goal: Cardiovascular complication will be avoided Outcome: Progressing   Problem: Activity: Goal: Risk for activity intolerance will decrease Outcome: Progressing   Problem: Nutrition: Goal: Adequate nutrition will be maintained Outcome: Progressing   Problem: Coping: Goal: Level of anxiety will decrease Outcome: Progressing   Problem: Elimination: Goal: Will not experience complications related to bowel motility Outcome: Progressing Goal: Will not experience complications related to urinary retention Outcome: Progressing   Problem: Pain Managment: Goal: General experience of comfort will improve and/or be controlled Outcome: Progressing   Problem: Safety: Goal: Ability to remain free from injury will improve Outcome: Progressing   Problem: Skin Integrity: Goal: Risk for impaired skin integrity will decrease Outcome: Progressing   Problem: Education: Goal: Knowledge of the prescribed therapeutic regimen will improve Outcome: Progressing   Problem: Coping: Goal: Ability to identify and develop effective coping behavior will improve Outcome: Progressing   Problem: Respiratory: Goal: Verbalizations of increased ease of respirations will increase Outcome: Progressing   Problem: Pain Management: Goal:  Satisfaction with pain management regimen will improve Outcome: Progressing

## 2023-09-02 NOTE — Progress Notes (Signed)
 When entered patient's room, I noticed that she had removed her port and needle which was sitting on her bedside table. Notified charge RN, Renita. CHG bath done with gown and linen changed. Gauze and tape applied on site after CHG bath. Will continue to monitor.

## 2023-09-02 NOTE — Progress Notes (Signed)
 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  Per spouse, patient has had poor appetite and weight loss over the last few weeks.  She was seen by her oncologist 2 weeks ago and was started on Marinol  and Megace  to help with appetite. Over the last few days, patient had been significantly weak and had difficulty getting in bed. UA at intake concerning for UTI. Hospitalist called for admission.   Patient remains medically stable for discharge once safe disposition is obtained. 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    Patient continues to decline - taking PO poorly even with assistance, concern this is progression of disease complicated by advanced dementia - as we continue to focus on comfort , would recommend against lab draws, procedures, repeat imaging, aggressive therapy unless the purpose/goal is to improve overall comfort/quality of life. Defer to oncology/radiation for ongoing testing/treatment per their expertise. Labs x1 am 6/30 in preparation for possible PET scan.  Assessment & Plan:   Principal Problem:   Generalized weakness Active Problems:   Hyperlipidemia   Essential hypertension   History of CVA (cerebrovascular accident)   Anemia associated with chemotherapy   Thrombocytopenia (HCC)   Breast cancer metastasized to bone, unspecified laterality (HCC)   UTI (urinary tract infection)   Pancytopenia (HCC)   DNR (do not resuscitate)   Dementia (HCC)    Weakness generalized   Counseling and coordination of care   Goals of care, counseling/discussion   Palliative care encounter   Metastatic malignant neoplasm [facility])   Concern about end of life   Pain   ACP (advance care planning)   Medication management   UTI, acute cystitis: Complicated by immunocompromised state. - 5-day antibiotics course completed 6/25. - Blood cultures negative so far, urine culture with multiple species.   Generalized weakness, FTT - In the setting of advanced metastatic breast CA, stage IV . - Complicated by worsening pancytopenia/UTI - Transfused 1 unit packed RBCs on 6/21, H&H stable. - Continue Marinol , Megace , fall precautions - Palliative medicine, oncology following. - Care transitioned to full comfort care.   Goals of care  - Lengthy discussion with patient and her husband, again attempted to discuss prognosis/goals of care. - Patient's husband is fixated on repeat PET scan despite our discussion that included Dr Jessy note indicating there is no 'further therapy for her'. - At this time I would not recommend any further invasive testing and would focus on patient's comfort and quality of life  - I appreciate oncology and palliative care assistance with this conversation. Palliative care has changed the care to full comfort care.   Right hip pain in the setting of metastatic breast CA, metastasis: - Continue pain control. - Right hip x-ray shows sclerotic lesions, no obvious lytic lesion or bony destruction.  CT chest abdomen pelvis on 6/22 showed scattered sclerotic lesions in the bones slightly increased from 2024, no pathologic fractures. Would not repeat imaging given limited options for intervention at this time.   Pancytopenia with mild anemia, thrombocytopenia -  Hemoglobin 9.9 on admission and received 1 unit packed RBCs - repeat H&H > 11 - Avoid further blood products without overt symptoms or <8 given risk vs benefits.   Metastatic  breast cancer, Osseous metastases - Continue gabapentin , Megace , Marinol , as needed tramadol   - CT chest abdomen pelvis on 6/22 showed scattered sclerotic lesions in the bones slightly increased from 2024, no pathologic fractures, anasarca with moderate ascites, loculated moderate left pleural effusion - US  thoracentesis, likely malignant.  Currently no hypoxia or abdominal pain, hold off on paracentesis. - Oncology, palliative medicine following, recommended DNR.   Left moderate pleural effusion, also chronic pleural effusion - Likely secondary to breast malignancy - CT chest abdomen pelvis showed moderate loculated left pleural effusion and small right pleural effusion, emphysema, moderate ascites and anasarca - US  guided left thoracentesis on 6/23, 720 cc removed.   Atrial fibrillation, rate controlled: - HR stable, continue Lopressor . - Hold Eliquis  in the setting of thrombocytopenia    Chronic diastolic HF - Continue on digoxin , Lasix  and Lopressor  - Echo showed EF 60-65%, no regional WMA, left ventricular diastolic function could not be evaluated, RV SF, massive biatrial dilation suspect secondary to underlying A-fib but could consider workup for infiltrative cardiomyopathy, left atrial size severely dilated, moderate AR - Cardiology consulted   Hypophosphatemia - Continue replacement.  HLD - Continue atorvastatin .  Hypothyroidism - Continue Armour - TSH 5.9  Seizure disorder - Continue Keppra   GERD - Continue Protonix .  Dementia - Continue Aricept  and memantine .  Asthma - No wheezing, continue home bronchodilators.   DVT prophylaxis: SCDs Code Status: DNR-Comfort care Family Communication: No family at bed side Disposition Plan:    Status is: Inpatient Remains inpatient appropriate because: Inpatient severity of illness. Transition to full comfort focused care at this time.   Consultants:  Palliative care  Procedures:  Antimicrobials:  Anti-infectives (From  admission, onward)    Start     Dose/Rate Route Frequency Ordered Stop   08/21/23 2000  cefTRIAXone  (ROCEPHIN ) 1 g in sodium chloride  0.9 % 100 mL IVPB        1 g 200 mL/hr over 30 Minutes Intravenous Every 24 hours 08/21/23 1821 08/25/23 2200       Subjective: Patient was seen and examined at bedside.  Overnight events noted. Patient appears comfortable,  very deconditioned,sick looking, partially following commands..  Objective: Vitals:   09/01/23 0956 09/01/23 1000 09/02/23 0430 09/02/23 1114  BP:   115/65 106/60  Pulse: 86  87 80  Resp:   16   Temp:   98.1 F (36.7 C)   TempSrc:   Oral   SpO2: 91% 92% 94% 96%  Weight:      Height:        Intake/Output Summary (Last 24 hours) at 09/02/2023 1339 Last data filed at 09/02/2023 0900 Gross per 24 hour  Intake 170 ml  Output --  Net 170 ml   Filed Weights   08/28/23 0500 08/29/23 0623 08/30/23 0507  Weight: 51.6 kg 51.6 kg 53.2 kg    Examination:  General exam: Appears calm and comfortable, severely deconditioned,  not in acute distress Respiratory system: CTA Bilaterally . Respiratory effort normal. RR 14 Cardiovascular system: S1 & S2 heard, No JVD, murmurs, rubs, gallops or clicks. No pedal edema. Gastrointestinal system: Abdomen is non distended, soft and non tender.  Normal bowel sounds heard. Central nervous system: Confused, not following commands. No focal neurological deficits. Extremities: No edema, no cyanosis, no clubbing Skin: No rashes, lesions  or ulcers Psychiatry: Mood & affect appropriate.    Data Reviewed: I have personally reviewed following labs and imaging studies  CBC: Recent Labs  Lab 08/30/23 0500  WBC 1.9*  NEUTROABS 1.4*  HGB 10.9*  HCT 32.7*  MCV 101.9*  PLT 77*   Basic Metabolic Panel: Recent Labs  Lab 08/30/23 0500  NA 139  K 3.2*  CL 99  CO2 29  GLUCOSE 106*  BUN 26*  CREATININE 0.66  CALCIUM  8.1*   GFR: Estimated Creatinine Clearance: 38.9 mL/min (by C-G formula  based on SCr of 0.66 mg/dL). Liver Function Tests: No results for input(s): AST, ALT, ALKPHOS, BILITOT, PROT, ALBUMIN  in the last 168 hours. No results for input(s): LIPASE, AMYLASE in the last 168 hours. No results for input(s): AMMONIA in the last 168 hours. Coagulation Profile: No results for input(s): INR, PROTIME in the last 168 hours. Cardiac Enzymes: No results for input(s): CKTOTAL, CKMB, CKMBINDEX, TROPONINI in the last 168 hours. BNP (last 3 results) No results for input(s): PROBNP in the last 8760 hours. HbA1C: No results for input(s): HGBA1C in the last 72 hours. CBG: No results for input(s): GLUCAP in the last 168 hours. Lipid Profile: No results for input(s): CHOL, HDL, LDLCALC, TRIG, CHOLHDL, LDLDIRECT in the last 72 hours. Thyroid  Function Tests: No results for input(s): TSH, T4TOTAL, FREET4, T3FREE, THYROIDAB in the last 72 hours. Anemia Panel: No results for input(s): VITAMINB12, FOLATE, FERRITIN, TIBC, IRON , RETICCTPCT in the last 72 hours. Sepsis Labs: No results for input(s): PROCALCITON, LATICACIDVEN in the last 168 hours.  Recent Results (from the past 240 hours)  Culture, body fluid w Gram Stain-bottle     Status: None   Collection Time: 08/23/23  2:32 PM   Specimen: Fluid  Result Value Ref Range Status   Specimen Description FLUID PLEURAL  Final   Special Requests BOTTLES DRAWN AEROBIC AND ANAEROBIC  Final   Culture   Final    NO GROWTH 5 DAYS Performed at [facility], [address] [place], [place] [address]    Report Status 08/28/2023 FINAL  Final  Gram stain     Status: None   Collection Time: 08/23/23  2:32 PM   Specimen: Fluid  Result Value Ref Range Status   Specimen Description FLUID PLEURAL  Final   Special Requests NONE  Final   Gram Stain   Final    WBC PRESENT,BOTH PMN AND MONONUCLEAR NO ORGANISMS SEEN CYTOSPIN SMEAR Performed at [facility],  [address], [place], [place] [address]    Report Status 08/25/2023 FINAL  Final    Radiology Studies: No results found.  Scheduled Meds:  digoxin   0.125 mg Oral Daily   fluticasone   1 spray Each Nare BID   fluticasone  furoate-vilanterol  1 puff Inhalation Daily   furosemide   40 mg Oral BID   gabapentin   400 mg Oral BID   latanoprost   1 drop Both Eyes BID   levETIRAcetam   500 mg Oral QHS   pantoprazole   40 mg Oral Daily   sodium chloride  flush  10-40 mL Intracatheter Q12H   spironolactone   25 mg Oral Daily   thyroid   90 mg Oral Daily   Continuous Infusions:   LOS: 11 days    Time spent: 35 mins    Darcel Dawley, MD [facility]   If 7PM-7AM, please contact night-coverage

## 2023-09-02 NOTE — TOC Progression Note (Signed)
 Transition of Care [facility]) - Progression Note    Patient Details  Name: Patricia [name] MRN: [id number] Date of Birth: 10/25/1940  Transition of Care [facility]) CM/SW Contact  Toy LITTIE Agar, RN Phone Number:[phone]  09/02/2023, 2:53 PM  Clinical Narrative:    [facility] is not able to accept patient . Husband reports bedside meeting with [facility] today. Husband concerned because he does not this ALF is appropriate level of care right now. CM has sent message to [facility] with [facility]. List of accepting facilities have been presented to husband to assist with choice.    Expected Discharge Plan: Long Term Nursing Home Barriers to Discharge: Continued Medical Work up, Other (must enter comment) (Requires long term care)  Expected Discharge Plan and Services In-house Referral: NA Discharge Planning Services: CM Consult Post Acute Care Choice: NA Living arrangements for the past 2 months: Single Family Home                 DME Arranged: N/A DME Agency: NA       HH Arranged: NA HH Agency: NA         Social Determinants of Health (SDOH) Interventions SDOH Screenings   Food Insecurity: No Food Insecurity (08/21/2023)  Housing: Low Risk  (08/21/2023)  Transportation Needs: No Transportation Needs (08/21/2023)  Utilities: Not At Risk (08/21/2023)  Alcohol Screen: Low Risk  (07/30/2023)  Depression (PHQ2-9): Medium Risk (08/03/2023)  Financial Resource Strain: Low Risk  (07/30/2023)  Physical Activity: Inactive (07/30/2023)  Social Connections: Moderately Integrated (08/21/2023)  Stress: No Stress Concern Present (07/30/2023)  Tobacco Use: Medium Risk (08/20/2023)  Health Literacy: Adequate Health Literacy (10/29/2022)    Readmission Risk Interventions    08/25/2023    2:47 PM  Readmission Risk Prevention Plan  Transportation Screening Complete  PCP or Specialist Appt within 3-5 Days Complete  HRI or Home Care Consult Complete  Social Work Consult for Recovery Care  Planning/Counseling Complete  Palliative Care Screening Complete  Medication Review [employer]) Complete

## 2023-09-03 DIAGNOSIS — R531 Weakness: Secondary | ICD-10-CM | POA: Diagnosis not present

## 2023-09-03 MED ORDER — ORAL CARE MOUTH RINSE: 15.0000 mL | OROMUCOSAL | Status: DC | PRN

## 2023-09-03 NOTE — Progress Notes (Signed)
 [id number] hospital liaison note:  Patient is referred for hospice services at discharge and current plan is for discharge to LTC facility. Per report of patient's husband he is scheduled to go talk to the admissions coordinator at [facility] on Monday.   Liaison will continue to follow and help facilitate discharge once confirmed.  Hunter Seip, BSN, [employer] hospital liaison [phone]

## 2023-09-03 NOTE — Plan of Care (Signed)
  Problem: Coping: Goal: Level of anxiety will decrease Outcome: Progressing   Problem: Elimination: Goal: Will not experience complications related to bowel motility Outcome: Progressing   Problem: Pain Managment: Goal: General experience of comfort will improve and/or be controlled Outcome: Progressing   Problem: Safety: Goal: Ability to remain free from injury will improve Outcome: Progressing

## 2023-09-03 NOTE — Progress Notes (Signed)
 Assumed care of pt from Abigail, RN

## 2023-09-03 NOTE — Progress Notes (Signed)
 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  Per spouse, patient has had poor appetite and weight loss over the last few weeks.  She was seen by her oncologist 2 weeks ago and was started on Marinol  and Megace  to help with appetite. Over the last few days, patient had been significantly weak and had difficulty getting in bed. UA at intake concerning for UTI. Hospitalist called for admission.   Patient remains medically stable for discharge once safe disposition is obtained. 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    Patient continues to decline - taking PO poorly even with assistance, concern this is progression of disease complicated by advanced dementia - as we continue to focus on comfort , would recommend against lab draws, procedures, repeat imaging, aggressive therapy unless the purpose/goal is to improve overall comfort/quality of life. Defer to oncology/radiation for ongoing testing/treatment per their expertise. Labs x1 am 6/30 in preparation for possible PET scan.  Assessment & Plan:   Principal Problem:   Generalized weakness Active Problems:   Hyperlipidemia   Essential hypertension   History of CVA (cerebrovascular accident)   Anemia associated with chemotherapy   Thrombocytopenia (HCC)   Breast cancer metastasized to bone, unspecified laterality (HCC)   UTI (urinary tract infection)   Pancytopenia (HCC)   DNR (do not resuscitate)   Dementia (HCC)    Weakness generalized   Counseling and coordination of care   Goals of care, counseling/discussion   Palliative care encounter   Metastatic malignant neoplasm [facility])   Concern about end of life   Pain   ACP (advance care planning)   Medication management   UTI, Acute cystitis: Complicated by immunocompromised state. - 5-day antibiotics course completed 6/25. - Blood cultures negative so far, urine culture with multiple species.   Generalized weakness, FTT - In the setting of advanced metastatic breast CA, stage IV . - Complicated by worsening pancytopenia/UTI - Transfused 1 unit packed RBCs on 6/21, H&H stable. - Continue Marinol , Megace , fall precautions - Palliative medicine, oncology following. - Care transitioned to full comfort care.   Goals of care  - Lengthy discussion with patient and her husband, again attempted to discuss prognosis/goals of care. - Patient's husband is fixated on repeat PET scan despite our discussion that included Dr Jessy note indicating there is no 'further therapy for her'. - At this time I would not recommend any further invasive testing and would focus on patient's comfort and quality of life  - I appreciate oncology and palliative care assistance with this conversation. Palliative care has changed the care to full comfort care.   Right hip pain in the setting of metastatic breast CA, metastasis: - Continue pain control. - Right hip x-ray shows sclerotic lesions, no obvious lytic lesion or bony destruction.  CT chest abdomen pelvis on 6/22 showed scattered sclerotic lesions in the bones slightly increased from 2024, no pathologic fractures. Would not repeat imaging given limited options for intervention at this time.   Pancytopenia with mild anemia, thrombocytopenia -  Hemoglobin 9.9 on admission and received 1 unit packed RBCs - repeat H&H > 11 - Avoid further blood products without overt symptoms or <8 given risk vs benefits.   Metastatic  breast cancer, Osseous metastases - Continue gabapentin , Megace , Marinol , as needed tramadol   - CT chest abdomen pelvis on 6/22 showed scattered sclerotic lesions in the bones slightly increased from 2024, no pathologic fractures, anasarca with moderate ascites, loculated moderate left pleural effusion - US  thoracentesis, likely malignant.  Currently no hypoxia or abdominal pain, hold off on paracentesis. - Oncology, palliative medicine following, recommended DNR.   Left moderate pleural effusion, also chronic pleural effusion - Likely secondary to breast malignancy - CT chest abdomen pelvis showed moderate loculated left pleural effusion and small right pleural effusion, emphysema, moderate ascites and anasarca - US  guided left thoracentesis on 6/23, 720 cc removed.   Atrial fibrillation, rate controlled: - HR stable, continue Lopressor . - Hold Eliquis  in the setting of thrombocytopenia    Chronic diastolic HF - Continue on digoxin , Lasix  and Lopressor  - Echo showed EF 60-65%, no regional WMA, left ventricular diastolic function could not be evaluated, RV SF, massive biatrial dilation suspect secondary to underlying A-fib but could consider workup for infiltrative cardiomyopathy, left atrial size severely dilated, moderate AR - Cardiology consulted   Hypophosphatemia - Continue replacement.  HLD - Continue atorvastatin .  Hypothyroidism - Continue Armour - TSH 5.9  Seizure disorder - Continue Keppra   GERD - Continue Protonix .  Dementia - Continue Aricept  and memantine .  Asthma - No wheezing, continue home bronchodilators.   DVT prophylaxis: SCDs Code Status: DNR-Comfort care Family Communication: No family at bed side Disposition Plan:    Status is: Inpatient Remains inpatient appropriate because: Inpatient severity of illness.  Transition to full comfort focused care at this time.   Consultants:  Palliative care  Procedures:  Antimicrobials:  Anti-infectives (From  admission, onward)    Start     Dose/Rate Route Frequency Ordered Stop   08/21/23 2000  cefTRIAXone  (ROCEPHIN ) 1 g in sodium chloride  0.9 % 100 mL IVPB        1 g 200 mL/hr over 30 Minutes Intravenous Every 24 hours 08/21/23 1821 08/25/23 2200       Subjective: Patient was seen and examined at bedside.  Overnight events noted. Patient appears comfortable, very deconditioned, sick looking, partially following commands. She denies any overnight concerns.  Objective: Vitals:   09/02/23 1114 09/03/23 0517 09/03/23 0745 09/03/23 1024  BP: 106/60 114/69 112/69 124/78  Pulse: 80 82 89 93  Resp:  18    Temp:  98.6 F (37 C)    TempSrc:  Oral    SpO2: 96% 94%    Weight:      Height:        Intake/Output Summary (Last 24 hours) at 09/03/2023 1103 Last data filed at 09/02/2023 1900 Gross per 24 hour  Intake --  Output 400 ml  Net -400 ml   Filed Weights   08/28/23 0500 08/29/23 0623 08/30/23 0507  Weight: 51.6 kg 51.6 kg 53.2 kg    Examination:  General exam: Appears calm and comfortable, severely deconditioned, Sick looking,  not in acute distress Respiratory system: CTA Bilaterally . Respiratory effort normal. RR 14 Cardiovascular system: S1 & S2 heard, No JVD, murmurs, rubs, gallops or clicks. No pedal edema. Gastrointestinal system: Abdomen is non distended, soft and non tender.  Normal bowel sounds heard. Central nervous system: Confused, not following commands. No focal neurological deficits. Extremities: No edema,  no cyanosis, no clubbing Skin: No rashes, lesions or ulcers Psychiatry: Mood & affect appropriate.    Data Reviewed: I have personally reviewed following labs and imaging studies  CBC: Recent Labs  Lab 08/30/23 0500  WBC 1.9*  NEUTROABS 1.4*  HGB 10.9*  HCT 32.7*  MCV 101.9*  PLT 77*   Basic Metabolic Panel: Recent Labs  Lab 08/30/23 0500  NA 139  K 3.2*  CL 99  CO2 29  GLUCOSE 106*  BUN 26*  CREATININE 0.66  CALCIUM  8.1*    GFR: Estimated Creatinine Clearance: 38.9 mL/min (by C-G formula based on SCr of 0.66 mg/dL). Liver Function Tests: No results for input(s): AST, ALT, ALKPHOS, BILITOT, PROT, ALBUMIN  in the last 168 hours. No results for input(s): LIPASE, AMYLASE in the last 168 hours. No results for input(s): AMMONIA in the last 168 hours. Coagulation Profile: No results for input(s): INR, PROTIME in the last 168 hours. Cardiac Enzymes: No results for input(s): CKTOTAL, CKMB, CKMBINDEX, TROPONINI in the last 168 hours. BNP (last 3 results) No results for input(s): PROBNP in the last 8760 hours. HbA1C: No results for input(s): HGBA1C in the last 72 hours. CBG: No results for input(s): GLUCAP in the last 168 hours. Lipid Profile: No results for input(s): CHOL, HDL, LDLCALC, TRIG, CHOLHDL, LDLDIRECT in the last 72 hours. Thyroid  Function Tests: No results for input(s): TSH, T4TOTAL, FREET4, T3FREE, THYROIDAB in the last 72 hours. Anemia Panel: No results for input(s): VITAMINB12, FOLATE, FERRITIN, TIBC, IRON , RETICCTPCT in the last 72 hours. Sepsis Labs: No results for input(s): PROCALCITON, LATICACIDVEN in the last 168 hours.  No results found for this or any previous visit (from the past 240 hours).   Radiology Studies: No results found.  Scheduled Meds:  digoxin   0.125 mg Oral Daily   fluticasone   1 spray Each Nare BID   fluticasone  furoate-vilanterol  1 puff Inhalation Daily   furosemide   40 mg Oral BID   gabapentin   400 mg Oral BID   latanoprost   1 drop Both Eyes BID   levETIRAcetam   500 mg Oral QHS   pantoprazole   40 mg Oral Daily   sodium chloride  flush  10-40 mL Intracatheter Q12H   spironolactone   25 mg Oral Daily   thyroid   90 mg Oral Daily   Continuous Infusions:   LOS: 12 days    Time spent: 35 mins    Darcel Dawley, MD [facility]   If 7PM-7AM, please contact night-coverage

## 2023-09-04 DIAGNOSIS — Z515 Encounter for palliative care: Secondary | ICD-10-CM | POA: Diagnosis not present

## 2023-09-04 DIAGNOSIS — C7951 Secondary malignant neoplasm of bone: Secondary | ICD-10-CM | POA: Diagnosis not present

## 2023-09-04 DIAGNOSIS — R531 Weakness: Secondary | ICD-10-CM | POA: Diagnosis not present

## 2023-09-04 DIAGNOSIS — C50919 Malignant neoplasm of unspecified site of unspecified female breast: Secondary | ICD-10-CM | POA: Diagnosis not present

## 2023-09-04 NOTE — Progress Notes (Signed)
 Ms. Goosby is quite pleasant this morning.  She is quite demented.  She says she had a good time watching fireworks yesterday at the bank.  She says she also went walking yesterday around the neighborhood.  She said that she ate well.  Again, this dementia is her biggest problem.  We are trying to find an appropriate facility for her.  Her husband just cannot take care of her.  He has hernias that need to be fixed.  At least, she is not hurting.  There is no shortness of breath.  Again I am not sure what she is eating.  I am unsure if she really remembers to eat.  She has had no bleeding.  She has had no cough.  She has had no headache.  There has been no hip pain.  She has had no leg swelling.  There have been no rashes.  Vital signs are temperature of 97.9.  Pulse 66.  Blood pressure 94/58.  Head and neck exam shows no ocular or oral lesions.  She has no scleral icterus.  She has no adenopathy.  Lungs actually sound pretty good bilaterally.  Cardiac exam is irregular rate and irregular rhythm consistent with atrial fibrillation.  The rate is well-controlled.  Abdomen soft.  Bowel sounds are present.  There is no fluid wave.  She has no palpable liver or spleen tip.  Extremities shows no clubbing, cyanosis or edema.  Neurological exam shows no focal neurological deficits.  She has metastatic breast cancer.  However, I just do not think this is that much of an issue right now given the issues with her cognitive state.  She is pleasant.  I really do not think that she realizes that she is in the hospital.  She tells me that her husband is out traveling.  Again, they are trying to find a place for her to stay.  I told her I would understand this.  I do not think that Hospice would be a bad idea for her for them to follow her up wherever she might go.  Hopefully, she will be able to be discharged soon.    Jeralyn Crease, MD  [place] 6:34

## 2023-09-04 NOTE — Plan of Care (Signed)

## 2023-09-04 NOTE — Progress Notes (Signed)
 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  Per spouse, patient has had poor appetite and weight loss over the last few weeks.  She was seen by her oncologist 2 weeks ago and was started on Marinol  and Megace  to help with appetite. Over the last few days, patient had been significantly weak and had difficulty getting in bed. UA at intake concerning for UTI. Hospitalist called for admission.   Patient remains medically stable for discharge once safe disposition is obtained. 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    Patient continues to decline - taking PO poorly even with assistance, concern this is progression of disease complicated by advanced dementia - as we continue to focus on comfort , would recommend against lab draws, procedures, repeat imaging, aggressive therapy unless the purpose/goal is to improve overall comfort/quality of life. Defer to oncology/radiation for ongoing testing/treatment per their expertise. Labs x1 am 6/30 in preparation for possible PET scan.  Assessment & Plan:   Principal Problem:   Generalized weakness Active Problems:   Hyperlipidemia   Essential hypertension   History of CVA (cerebrovascular accident)   Anemia associated with chemotherapy   Thrombocytopenia (HCC)   Breast cancer metastasized to bone, unspecified laterality (HCC)   UTI (urinary tract infection)   Pancytopenia (HCC)   DNR (do not resuscitate)   Dementia (HCC)    Weakness generalized   Counseling and coordination of care   Goals of care, counseling/discussion   Palliative care encounter   Metastatic malignant neoplasm [facility])   Concern about end of life   Pain   ACP (advance care planning)   Medication management   UTI, Acute cystitis: Complicated by immunocompromised state. - 5-day antibiotics course completed 6/25. - Blood cultures negative so far, urine culture with multiple species.   Generalized weakness, FTT - In the setting of advanced metastatic breast CA, stage IV . - Complicated by worsening pancytopenia/UTI - Transfused 1 unit packed RBCs on 6/21, H&H stable. - Continue Marinol , Megace , fall precautions - Palliative medicine, oncology following. - Care transitioned to full comfort care.   Goals of care  - Lengthy discussion with patient and her husband, again attempted to discuss prognosis/goals of care. - Patient's husband is fixated on repeat PET scan despite our discussion that included Dr Jessy note indicating there is no 'further therapy for her'. - At this time I would not recommend any further invasive testing and would focus on patient's comfort and quality of life  - I appreciate oncology and palliative care assistance with this conversation. Palliative care has changed the care to full comfort care.   Right hip pain in the setting of metastatic breast CA, metastasis: - Continue pain control. - Right hip x-ray shows sclerotic lesions, no obvious lytic lesion or bony destruction.  CT chest abdomen pelvis on 6/22 showed scattered sclerotic lesions in the bones slightly increased from 2024, no pathologic fractures. Would not repeat imaging given limited options for intervention at this time.   Pancytopenia with mild anemia, thrombocytopenia -  Hemoglobin 9.9 on admission and received 1 unit packed RBCs - repeat H&H > 11 - Avoid further blood products without overt symptoms or <8 given risk vs benefits.   Metastatic  breast cancer, Osseous metastases - Continue gabapentin , Megace , Marinol , as needed tramadol   - CT chest abdomen pelvis on 6/22 showed scattered sclerotic lesions in the bones slightly increased from 2024, no pathologic fractures, anasarca with moderate ascites, loculated moderate left pleural effusion - US  thoracentesis, likely malignant.  Currently no hypoxia or abdominal pain, hold off on paracentesis. - Oncology, palliative medicine following, recommended DNR.   Left moderate pleural effusion, also chronic pleural effusion - Likely secondary to breast malignancy - CT chest abdomen pelvis showed moderate loculated left pleural effusion and small right pleural effusion, emphysema, moderate ascites and anasarca - US  guided left thoracentesis on 6/23, 720 cc removed.   Atrial fibrillation, rate controlled: - HR stable, continue Lopressor . - Hold Eliquis  in the setting of thrombocytopenia    Chronic diastolic HF - Continue on digoxin , Lasix  and Lopressor  - Echo showed EF 60-65%, no regional WMA, left ventricular diastolic function could not be evaluated, RV SF, massive biatrial dilation suspect secondary to underlying A-fib but could consider workup for infiltrative cardiomyopathy, left atrial size severely dilated, moderate AR - Cardiology consulted   Hypophosphatemia - Continue replacement.  HLD - Continue atorvastatin .  Hypothyroidism - Continue Armour - TSH 5.9  Seizure disorder - Continue Keppra   GERD - Continue Protonix .  Dementia - Continue Aricept  and memantine .  Asthma - No wheezing, continue home bronchodilators.   DVT prophylaxis: SCDs Code Status: DNR-Comfort care Family Communication: No family at bed side Disposition Plan:    Status is: Inpatient Remains inpatient appropriate because: Inpatient severity of illness.  Transition to full comfort focused care at this time.   Consultants:  Palliative care  Procedures:  Antimicrobials:  Anti-infectives (From  admission, onward)    Start     Dose/Rate Route Frequency Ordered Stop   08/21/23 2000  cefTRIAXone  (ROCEPHIN ) 1 g in sodium chloride  0.9 % 100 mL IVPB        1 g 200 mL/hr over 30 Minutes Intravenous Every 24 hours 08/21/23 1821 08/25/23 2200       Subjective: Patient was seen and examined at bedside.  Overnight events noted. Patient appears comfortable, very deconditioned, sick looking, partially following commands. She denies any overnight concerns.  She is awaiting placement in a long-term facility with hospice services  Objective: Vitals:   09/03/23 1024 09/03/23 1350 09/04/23 0516 09/04/23 0807  BP: 124/78 127/64 (!) 94/58 128/71  Pulse: 93 (!) 107 66 83  Resp:   18 17  Temp:   97.9 F (36.6 C) 98.1 F (36.7 C)  TempSrc:   Oral Oral  SpO2:  94% 93% 92%  Weight:      Height:       No intake or output data in the 24 hours ending 09/04/23 1118  Filed Weights   08/28/23 0500 08/29/23 0623 08/30/23 0507  Weight: 51.6 kg 51.6 kg 53.2 kg    Examination:  General exam: Appears calm and comfortable, severely deconditioned, Sick looking,  not in acute distress Respiratory system: CTA Bilaterally . Respiratory effort normal. RR 15 Cardiovascular system: S1 & S2 heard, No JVD, murmurs, rubs, gallops or clicks. No pedal edema. Gastrointestinal system: Abdomen is non distended, soft and non tender.  Normal bowel sounds heard. Central nervous system: Confused, not following commands. No focal neurological deficits. Extremities: No edema, no cyanosis,  no clubbing Skin: No rashes, lesions or ulcers Psychiatry: Mood & affect appropriate.    Data Reviewed: I have personally reviewed following labs and imaging studies  CBC: Recent Labs  Lab 08/30/23 0500  WBC 1.9*  NEUTROABS 1.4*  HGB 10.9*  HCT 32.7*  MCV 101.9*  PLT 77*   Basic Metabolic Panel: Recent Labs  Lab 08/30/23 0500  NA 139  K 3.2*  CL 99  CO2 29  GLUCOSE 106*  BUN 26*  CREATININE 0.66  CALCIUM  8.1*    GFR: Estimated Creatinine Clearance: 38.9 mL/min (by C-G formula based on SCr of 0.66 mg/dL). Liver Function Tests: No results for input(s): AST, ALT, ALKPHOS, BILITOT, PROT, ALBUMIN  in the last 168 hours. No results for input(s): LIPASE, AMYLASE in the last 168 hours. No results for input(s): AMMONIA in the last 168 hours. Coagulation Profile: No results for input(s): INR, PROTIME in the last 168 hours. Cardiac Enzymes: No results for input(s): CKTOTAL, CKMB, CKMBINDEX, TROPONINI in the last 168 hours. BNP (last 3 results) No results for input(s): PROBNP in the last 8760 hours. HbA1C: No results for input(s): HGBA1C in the last 72 hours. CBG: No results for input(s): GLUCAP in the last 168 hours. Lipid Profile: No results for input(s): CHOL, HDL, LDLCALC, TRIG, CHOLHDL, LDLDIRECT in the last 72 hours. Thyroid  Function Tests: No results for input(s): TSH, T4TOTAL, FREET4, T3FREE, THYROIDAB in the last 72 hours. Anemia Panel: No results for input(s): VITAMINB12, FOLATE, FERRITIN, TIBC, IRON , RETICCTPCT in the last 72 hours. Sepsis Labs: No results for input(s): PROCALCITON, LATICACIDVEN in the last 168 hours.  No results found for this or any previous visit (from the past 240 hours).   Radiology Studies: No results found.  Scheduled Meds:  digoxin   0.125 mg Oral Daily   fluticasone   1 spray Each Nare BID   fluticasone  furoate-vilanterol  1 puff Inhalation Daily   furosemide   40 mg Oral BID   gabapentin   400 mg Oral BID   latanoprost   1 drop Both Eyes BID   levETIRAcetam   500 mg Oral QHS   pantoprazole   40 mg Oral Daily   sodium chloride  flush  10-40 mL Intracatheter Q12H   spironolactone   25 mg Oral Daily   thyroid   90 mg Oral Daily   Continuous Infusions:   LOS: 13 days    Time spent: 35 mins    Darcel Dawley, MD [facility]   If 7PM-7AM, please contact night-coverage

## 2023-09-04 NOTE — Progress Notes (Signed)
 Daily Progress Note   Patient Name: Patricia [name]       Date: 09/04/2023 DOB: Jul 23, 1940  Age: 83 y.o. MRN#: [id number] Attending Physician: Leotis Bogus, MD Primary Care Physician: Gayle Saddie Patricia [name] Admit Date: 08/20/2023 Length of Stay: 13 days  Reason for Consultation/Follow-up: Establishing goals of care  Subjective:   Reviewed EMR including recent documentation from hospitalist and oncologist.  Patient currently awaiting long-term care placement with hospice support. Patient laying comfortably in bed today.  Still having little oral intake.  Taking oral medications.  Not needing as needed medications for comfort currently.  Husband with visitors present at bedside so allowed time for patient to enjoy this.  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 Oncology consulted for recommendations. Palliative medicine team consulted to assist with complex medical decision making.   Recommendations/Plan: # Complex medical decision making/goals of care:     -Patient unable to participate in complex medical decision-making due to underlying medical status.  Patient has underlying dementia.      - Have  already discussed care with patient's husband.  Patient is not a candidate for further cancer directed therapies due to her debilitated state with worsening symptoms of dementia including increased sleeping throughout the day, poor functional status, and decreased oral intake.  Patient was already transitioned to full comfort focused care on 7/2.  Seeking long-term care placement with [facility] of [facility].  Palliative medicine team continuing to follow along to engage in conversations as able and appropriate.               -Have discontinued interventions that are no longer focused on comfort such as IV fluids, imaging, or lab work.  Focused on symptom management of pain, dyspnea, and agitation in the setting of end-of-life care.    Code Status: Do not attempt resuscitation (DNR) - Comfort care  # Symptom management Patient is receiving these palliative interventions for symptom management with an intent to improve quality of life.     -Pain/Dyspnea, acute in the setting of end-of-life care   - Continue tramadol  50 mg every 6 hours as needed   - Continue IV morphine  1-2 mg every 4 hours as needed breakthrough only after given oral tramadol     - Continue gabapentin  for 100 mg twice daily                 -Anxiety/agitation, in the setting of end-of-life care                               - Continue as needed Haldol , continue to adjust based on patient's symptom burden                  -  Secretions, in the setting of end-of-life care                               - Continue as needed glycopyrrolate , continue to adjust based on patient's symptom burden  # Psycho-social/Spiritual Support:  - Support System: Husband, son, daughter   # Discharge Planning: Seeking long-term care placement with [facility] hospice layered in  Thank you for allowing the palliative care team to participate in the care Patricia [name].  Patricia Radar, DO Palliative Care Provider PMT # [phone]  If patient remains symptomatic  despite maximum doses, please call PMT at [phone] between 0700 and 1900. Outside of these hours, please call attending, as PMT does not have night coverage.

## 2023-09-04 NOTE — Plan of Care (Signed)
  Problem: Clinical Measurements: Goal: Will remain free from infection Outcome: Progressing Goal: Cardiovascular complication will be avoided Outcome: Progressing   Problem: Coping: Goal: Level of anxiety will decrease Outcome: Progressing   Problem: Elimination: Goal: Will not experience complications related to urinary retention Outcome: Progressing   Problem: Pain Managment: Goal: General experience of comfort will improve and/or be controlled Outcome: Progressing

## 2023-09-05 DIAGNOSIS — C799 Secondary malignant neoplasm of unspecified site: Secondary | ICD-10-CM | POA: Diagnosis not present

## 2023-09-05 DIAGNOSIS — Z8673 Personal history of transient ischemic attack (TIA), and cerebral infarction without residual deficits: Secondary | ICD-10-CM | POA: Diagnosis not present

## 2023-09-05 DIAGNOSIS — R531 Weakness: Secondary | ICD-10-CM | POA: Diagnosis not present

## 2023-09-05 DIAGNOSIS — Z515 Encounter for palliative care: Secondary | ICD-10-CM | POA: Diagnosis not present

## 2023-09-05 DIAGNOSIS — Z7189 Other specified counseling: Secondary | ICD-10-CM | POA: Diagnosis not present

## 2023-09-05 LAB — CBC WITH DIFFERENTIAL/PLATELET
Abs Immature Granulocytes: 0.15 K/uL — ABNORMAL HIGH (ref 0.00–0.07)
Basophils Absolute: 0 K/uL (ref 0.0–0.1)
Basophils Relative: 1 %
Eosinophils Absolute: 0 K/uL (ref 0.0–0.5)
Eosinophils Relative: 0 %
HCT: 33.7 % — ABNORMAL LOW (ref 36.0–46.0)
Hemoglobin: 11.8 g/dL — ABNORMAL LOW (ref 12.0–15.0)
Immature Granulocytes: 3 %
Lymphocytes Relative: 10 %
Lymphs Abs: 0.5 K/uL — ABNORMAL LOW (ref 0.7–4.0)
MCH: 34.6 pg — ABNORMAL HIGH (ref 26.0–34.0)
MCHC: 35 g/dL (ref 30.0–36.0)
MCV: 98.8 fL (ref 80.0–100.0)
Monocytes Absolute: 0.7 K/uL (ref 0.1–1.0)
Monocytes Relative: 15 %
Neutro Abs: 3.4 K/uL (ref 1.7–7.7)
Neutrophils Relative %: 71 %
Platelets: 112 K/uL — ABNORMAL LOW (ref 150–400)
RBC: 3.41 MIL/uL — ABNORMAL LOW (ref 3.87–5.11)
RDW: 21.2 % — ABNORMAL HIGH (ref 11.5–15.5)
WBC: 4.8 K/uL (ref 4.0–10.5)
nRBC: 0.4 % — ABNORMAL HIGH (ref 0.0–0.2)

## 2023-09-05 LAB — COMPREHENSIVE METABOLIC PANEL WITH GFR
ALT: 48 U/L — ABNORMAL HIGH (ref 0–44)
AST: 57 U/L — ABNORMAL HIGH (ref 15–41)
Albumin: 2.7 g/dL — ABNORMAL LOW (ref 3.5–5.0)
Alkaline Phosphatase: 75 U/L (ref 38–126)
Anion gap: 10 (ref 5–15)
BUN: 16 mg/dL (ref 8–23)
CO2: 24 mmol/L (ref 22–32)
Calcium: 7.6 mg/dL — ABNORMAL LOW (ref 8.9–10.3)
Chloride: 101 mmol/L (ref 98–111)
Creatinine, Ser: 0.6 mg/dL (ref 0.44–1.00)
GFR, Estimated: >60 mL/min (ref >60)
Glucose, Bld: 93 mg/dL (ref 70–99)
Potassium: 3.4 mmol/L — ABNORMAL LOW (ref 3.5–5.1)
Sodium: 135 mmol/L (ref 135–145)
Total Bilirubin: 1.3 mg/dL — ABNORMAL HIGH (ref 0.0–1.2)
Total Protein: 5.5 g/dL — ABNORMAL LOW (ref 6.5–8.1)

## 2023-09-05 LAB — PREALBUMIN: Prealbumin: 18 mg/dL (ref 18–38)

## 2023-09-05 MED ORDER — POTASSIUM CHLORIDE 20 MEQ PO PACK
40.0000 meq | PACK | Freq: Once | ORAL | Status: AC
  Administered 2023-09-05: 40 meq via ORAL
  Filled 2023-09-05: qty 2

## 2023-09-05 NOTE — Plan of Care (Signed)
  Problem: Safety: Goal: Ability to remain free from injury will improve Outcome: Progressing   Problem: Skin Integrity: Goal: Risk for impaired skin integrity will decrease Outcome: Progressing   Problem: Pain Management: Goal: Satisfaction with pain management regimen will improve Outcome: Progressing

## 2023-09-05 NOTE — Plan of Care (Signed)
  Problem: Activity: Goal: Risk for activity intolerance will decrease Outcome: Progressing   Problem: Nutrition: Goal: Adequate nutrition will be maintained Outcome: Progressing   Problem: Elimination: Goal: Will not experience complications related to bowel motility Outcome: Progressing Goal: Will not experience complications related to urinary retention Outcome: Progressing   Problem: Pain Managment: Goal: General experience of comfort will improve and/or be controlled Outcome: Progressing

## 2023-09-05 NOTE — Progress Notes (Signed)
 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  Per spouse, patient has had poor appetite and weight loss over the last few weeks.  She was seen by her oncologist 2 weeks ago and was started on Marinol  and Megace  to help with appetite. Over the last few days, patient had been significantly weak and had difficulty getting in bed. UA at intake concerning for UTI. Hospitalist called for admission.   Patient remains medically stable for discharge once safe disposition is obtained. 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    Patient continues to decline - taking PO poorly even with assistance, concern this is progression of disease complicated by advanced dementia - as we continue to focus on comfort , would recommend against lab draws, procedures, repeat imaging, aggressive therapy unless the purpose/goal is to improve overall comfort/quality of life. Defer to oncology/radiation for ongoing testing/treatment per their expertise. Labs x1 am 6/30 in preparation for possible PET scan.  Assessment & Plan:   Principal Problem:   Generalized weakness Active Problems:   Hyperlipidemia   Essential hypertension   History of CVA (cerebrovascular accident)   Anemia associated with chemotherapy   Thrombocytopenia (HCC)   Breast cancer metastasized to bone, unspecified laterality (HCC)   UTI (urinary tract infection)   Pancytopenia (HCC)   DNR (do not resuscitate)   Dementia (HCC)    Weakness generalized   Counseling and coordination of care   Goals of care, counseling/discussion   Palliative care encounter   Metastatic malignant neoplasm [facility])   Concern about end of life   Pain   ACP (advance care planning)   Medication management   UTI, Acute cystitis: Complicated by immunocompromised state. - 5-day antibiotics course completed 6/25. - Blood cultures negative so far, urine culture with multiple species.   Generalized weakness, FTT - In the setting of advanced metastatic breast CA, stage IV . - Complicated by worsening pancytopenia/UTI - Transfused 1 unit packed RBCs on 6/21, H&H stable. - Continue Marinol , Megace , fall precautions - Palliative medicine, oncology following. - Care transitioned to full comfort care.   Goals of care  - Lengthy discussion with patient and her husband, again attempted to discuss prognosis/goals of care. - Patient's husband is fixated on repeat PET scan despite our discussion that included Dr Jessy note indicating there is no 'further therapy for her'. - At this time I would not recommend any further invasive testing and would focus on patient's comfort and quality of life  - I appreciate oncology and palliative care assistance with this conversation. Palliative care has changed the care to full comfort care.   Right hip pain in the setting of metastatic breast CA, metastasis: - Continue pain control. - Right hip x-ray shows sclerotic lesions, no obvious lytic lesion or bony destruction.  CT chest abdomen pelvis on 6/22 showed scattered sclerotic lesions in the bones slightly increased from 2024, no pathologic fractures. Would not repeat imaging given limited options for intervention at this time.   Pancytopenia with mild anemia, thrombocytopenia -  Hemoglobin 9.9 on admission and received 1 unit packed RBCs - repeat H&H > 11 - Avoid further blood products without overt symptoms or <8 given risk vs benefits.   Metastatic  breast cancer, Osseous metastases - Continue gabapentin , Megace , Marinol , as needed tramadol   - CT chest abdomen pelvis on 6/22 showed scattered sclerotic lesions in the bones slightly increased from 2024, no pathologic fractures, anasarca with moderate ascites, loculated moderate left pleural effusion - US  thoracentesis, likely malignant.  Currently no hypoxia or abdominal pain, hold off on paracentesis. - Oncology, palliative medicine following, recommended DNR.   Left moderate pleural effusion, also chronic pleural effusion - Likely secondary to breast malignancy - CT chest abdomen pelvis showed moderate loculated left pleural effusion and small right pleural effusion, emphysema, moderate ascites and anasarca - US  guided left thoracentesis on 6/23, 720 cc removed.   Atrial fibrillation, rate controlled: - HR stable, continue Lopressor . - Hold Eliquis  in the setting of thrombocytopenia    Chronic diastolic HF - Continue on digoxin , Lasix  and Lopressor  - Echo showed EF 60-65%, no regional WMA, left ventricular diastolic function could not be evaluated, RV SF, massive biatrial dilation suspect secondary to underlying A-fib but could consider workup for infiltrative cardiomyopathy, left atrial size severely dilated, moderate AR - Cardiology consulted   Hypophosphatemia - Continue replacement.  HLD - Continue atorvastatin .  Hypothyroidism - Continue Armour - TSH 5.9  Seizure disorder - Continue Keppra   GERD - Continue Protonix .  Dementia - Continue Aricept  and memantine .  Asthma - No wheezing, continue home bronchodilators.   DVT prophylaxis: SCDs Code Status: DNR-Comfort care Family Communication: No family at bed side Disposition Plan:    Status is: Inpatient Remains inpatient appropriate because: Inpatient severity of illness.  Transition to full comfort focused care at this time.   Consultants:  Palliative care  Procedures:  Antimicrobials:  Anti-infectives (From  admission, onward)    Start     Dose/Rate Route Frequency Ordered Stop   08/21/23 2000  cefTRIAXone  (ROCEPHIN ) 1 g in sodium chloride  0.9 % 100 mL IVPB        1 g 200 mL/hr over 30 Minutes Intravenous Every 24 hours 08/21/23 1821 08/25/23 2200       Subjective: Patient was seen and examined at bedside.  Overnight events noted. Patient appears comfortable, very deconditioned, sick looking,  following commands. She denies any overnight concerns.  She is awaiting placement in a long-term facility with hospice services  Objective: Vitals:   09/04/23 0516 09/04/23 0807 09/05/23 0607 09/05/23 0644  BP: (!) 94/58 128/71 122/64   Pulse: 66 83 89   Resp: 18 17 19    Temp: 97.9 F (36.6 C) 98.1 F (36.7 C) 97.7 F (36.5 C)   TempSrc: Oral Oral Oral   SpO2: 93% 92% 96% 95%  Weight:      Height:        Intake/Output Summary (Last 24 hours) at 09/05/2023 1222 Last data filed at 09/05/2023 0734 Gross per 24 hour  Intake 160 ml  Output --  Net 160 ml    Filed Weights   08/28/23 0500 08/29/23 0623 08/30/23 0507  Weight: 51.6 kg 51.6 kg 53.2 kg    Examination:  General exam: Appears comfortable, severely deconditioned, Sick looking, not in acute distress. Respiratory system: CTA Bilaterally . Respiratory effort normal. RR 15 Cardiovascular system: S1 & S2 heard, No JVD, murmurs, rubs, gallops or clicks. No pedal edema. Gastrointestinal system: Abdomen is non distended, soft and non tender.  Normal  bowel sounds heard. Central nervous system: Confused, not following commands. No focal neurological deficits. Extremities: No edema, no cyanosis, no clubbing Skin: No rashes, lesions or ulcers Psychiatry: Mood & affect appropriate.    Data Reviewed: I have personally reviewed following labs and imaging studies  CBC: Recent Labs  Lab 08/30/23 0500 09/05/23 0547  WBC 1.9* 4.8  NEUTROABS 1.4* 3.4  HGB 10.9* 11.8*  HCT 32.7* 33.7*  MCV 101.9* 98.8  PLT 77* 112*   Basic Metabolic  Panel: Recent Labs  Lab 08/30/23 0500 09/05/23 0547  NA 139 135  K 3.2* 3.4*  CL 99 101  CO2 29 24  GLUCOSE 106* 93  BUN 26* 16  CREATININE 0.66 0.60  CALCIUM  8.1* 7.6*   GFR: Estimated Creatinine Clearance: 38.9 mL/min (by C-G formula based on SCr of 0.6 mg/dL). Liver Function Tests: Recent Labs  Lab 09/05/23 0547  AST 57*  ALT 48*  ALKPHOS 75  BILITOT 1.3*  PROT 5.5*  ALBUMIN  2.7*   No results for input(s): LIPASE, AMYLASE in the last 168 hours. No results for input(s): AMMONIA in the last 168 hours. Coagulation Profile: No results for input(s): INR, PROTIME in the last 168 hours. Cardiac Enzymes: No results for input(s): CKTOTAL, CKMB, CKMBINDEX, TROPONINI in the last 168 hours. BNP (last 3 results) No results for input(s): PROBNP in the last 8760 hours. HbA1C: No results for input(s): HGBA1C in the last 72 hours. CBG: No results for input(s): GLUCAP in the last 168 hours. Lipid Profile: No results for input(s): CHOL, HDL, LDLCALC, TRIG, CHOLHDL, LDLDIRECT in the last 72 hours. Thyroid  Function Tests: No results for input(s): TSH, T4TOTAL, FREET4, T3FREE, THYROIDAB in the last 72 hours. Anemia Panel: No results for input(s): VITAMINB12, FOLATE, FERRITIN, TIBC, IRON , RETICCTPCT in the last 72 hours. Sepsis Labs: No results for input(s): PROCALCITON, LATICACIDVEN in the last 168 hours.  No results found for this or any previous visit (from the past 240 hours).   Radiology Studies: No results found.  Scheduled Meds:  digoxin   0.125 mg Oral Daily   fluticasone   1 spray Each Nare BID   fluticasone  furoate-vilanterol  1 puff Inhalation Daily   furosemide   40 mg Oral BID   gabapentin   400 mg Oral BID   latanoprost   1 drop Both Eyes BID   levETIRAcetam   500 mg Oral QHS   pantoprazole   40 mg Oral Daily   sodium chloride  flush  10-40 mL Intracatheter Q12H   spironolactone   25 mg Oral Daily   thyroid    90 mg Oral Daily   Continuous Infusions:   LOS: 14 days    Time spent: 35 mins    Darcel Dawley, MD [facility]   If 7PM-7AM, please contact night-coverage

## 2023-09-05 NOTE — Progress Notes (Signed)
 Daily Progress Note   Patient Name: Patricia [name]       Date: 09/05/2023 DOB: 1940-05-28  Age: 83 y.o. MRN#: [id number] Attending Physician: Leotis Bogus, MD Primary Care Physician: Gayle Saddie JULIANNA DEVONNA Admit Date: 08/20/2023 Length of Stay: 14 days  Reason for Consultation/Follow-up: Establishing goals of care  Subjective:   Reviewed EMR including recent documentation from hospitalist.  Patient continues to receive comfort focused care while awaiting on long-term care placement with hospice layered in.  Presented to bedside to see patient.  Patient sitting up in chair at bedside.  Husband present at bedside as well.  Able to follow-up regarding symptom management.  Patient appears comfortable.  Husband noted patient did have abdominal discomfort though had a bowel movement this morning and appears to feel better.  Patient on medication for acid reflux as well.  Husband noted he is planning to speak with representative at [facility] tomorrow at 11 AM.  Plan is still for seeking long-term care placement with hospice layered in.  No questions or concerns expressed.  Noted palliative medicine team would be available if needed.  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 Oncology consulted for recommendations. Palliative medicine team consulted to assist with complex medical decision making.   Recommendations/Plan: # Complex medical decision making/goals of care:     -Patient unable to participate in complex medical decision-making due to underlying medical status.  Patient has underlying dementia.      - Discussed care with patient's husband as detailed above in HPI.  Patient is not a candidate for further cancer directed therapies due to her debilitated state with worsening symptoms of dementia including increased sleeping throughout the day, poor functional status, and decreased oral intake.  Patient was already transitioned to full comfort focused care on 7/2.  Patient is not a candidate for inpatient hospice.  Seeking long-term care placement with [facility] of [facility].  Awaiting disposition.  Palliative medicine team will be available as needed.               -Have discontinued interventions that are no longer focused on comfort such as IV fluids, imaging, or lab work.  Focused on symptom management of pain, dyspnea, and agitation in the setting of end-of-life care.    Code Status: Do not attempt resuscitation (DNR) - Comfort care  # Symptom management Patient is receiving these palliative interventions for symptom management with an intent to improve quality of life.     -Pain/Dyspnea, acute in the setting of end-of-life care   - Continue tramadol  50 mg every 6 hours as needed   - Continue IV morphine  1-2 mg every 4 hours as needed breakthrough only after given oral tramadol     -  Continue gabapentin  for 100 mg twice daily                 -Anxiety/agitation, in the setting of end-of-life care                               - Continue as needed Haldol , continue to adjust based on patient's symptom burden                  -Secretions, in the setting of end-of-life care                                - Continue as needed glycopyrrolate , continue to adjust based on patient's symptom burden  # Psycho-social/Spiritual Support:  - Support System: Husband, son, daughter   # Discharge Planning: Seeking long-term care placement with [facility] hospice layered in  Thank you for allowing the palliative care team to participate in the care Patricia [name].  Palliative medicine team will continue to follow along peripherally.  Please reach out if acute PMT needs arise.  Tinnie Radar, DO Palliative Care Provider PMT # [phone]  If patient remains symptomatic despite maximum doses, please call PMT at [phone] between 0700 and 1900. Outside of these hours, please call attending, as PMT does not have night coverage.

## 2023-09-06 DIAGNOSIS — R531 Weakness: Secondary | ICD-10-CM | POA: Diagnosis not present

## 2023-09-06 NOTE — Progress Notes (Signed)
 Daily Progress Note   Patient Name: Patricia [name]       Date: 09/06/2023 DOB: 08-31-1940  Age: 83 y.o. MRN#: [id number] Attending Physician: Leotis Bogus, MD Primary Care Physician: Gayle Saddie JULIANNA DEVONNA Admit Date: 08/20/2023 Length of Stay: 15 days  Reason for Consultation/Follow-up: Establishing goals of care  Subjective:   Reviewed EMR including recent documentation from hospitalist.  Patient continues to receive comfort focused care while awaiting on long-term care placement with hospice layered in.  Patient resting in bed, appears weak, no acute symptoms, medication history noted.  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 Oncology consulted for recommendations. Palliative medicine team consulted to assist with complex medical decision making.   Recommendations/Plan: # Complex medical decision making/goals of care:     -Patient unable to participate in complex medical decision-making due to underlying medical status.  Patient has underlying dementia.      - Discussed care with patient's husband as detailed above in HPI.  Patient is not a candidate for further cancer directed therapies due to her debilitated  state with worsening symptoms of dementia including increased sleeping throughout the day, poor functional status, and decreased oral intake.  Patient was already transitioned to full comfort focused care on 7/2.  Patient is not a candidate for inpatient hospice.  Seeking long-term care placement with [facility] of [facility].  Awaiting disposition.  Palliative medicine team will be available as needed.               -Have discontinued interventions that are no longer focused on comfort such as IV fluids, imaging, or lab work.  Focused on symptom management of pain, dyspnea, and agitation in the setting of end-of-life care.    Code Status: Do not attempt resuscitation (DNR) - Comfort care  # Symptom management Patient is receiving these palliative interventions for symptom management with an intent to improve quality of life.     -Pain/Dyspnea, acute in the setting of end-of-life care   - Continue tramadol  50 mg every 6 hours as needed - one to two doses used in the past 24 hours.    - Continue IV morphine  1-2 mg every 4 hours as needed breakthrough only after given oral tramadol     - Continue gabapentin  for 100 mg twice daily                 -Anxiety/agitation, in the setting of end-of-life care                               - Continue as needed Haldol , continue to adjust based on patient's symptom burden                  -  Secretions, in the setting of end-of-life care                               - Continue as needed glycopyrrolate , continue to adjust based on patient's symptom burden  # Psycho-social/Spiritual Support:  - Support System: Husband, son, daughter   # Discharge Planning: Seeking long-term care placement with [facility] hospice support.   Thank you for allowing the palliative care team to participate in the care Patricia [name].  Palliative medicine team will continue to follow along peripherally.  Please reach out if acute PMT needs arise.  Low MDM Lonia Serve MD.  Palliative Care  Provider PMT # [phone]  If patient remains symptomatic despite maximum doses, please call PMT at [phone] between 0700 and 1900. Outside of these hours, please call attending, as PMT does not have night coverage.

## 2023-09-06 NOTE — TOC Progression Note (Addendum)
 Transition of Care [facility]) - Progression Note    Patient Details  Name: Patricia [name] MRN: [id number] Date of Birth: 01/28/1941  [facility]) CM/SW Contact  Toy LITTIE Agar, RN Phone Number:[phone]  09/06/2023, 2:08 PM  Clinical Narrative:    Adina with [employer] reports that patients spouse has accepted bed at [facility].  Spouse updated CM to make aware that he will finalize paperwork and secure room tomorrow after which DME can be delivered. CM to follow up with [facility] for DME delivery.   1420 Message has been sent to [facility] for [facility]. Message received form Matt to update that patient will need TB skin test. Message has been sent to MD.   MD has ordered quantiferon gold and it will take 72 hrs.  Expected Discharge Plan: Long Term Nursing Home Barriers to Discharge: Continued Medical Work up, Other (must enter comment) (Requires long term care)  Expected Discharge Plan and Services In-house Referral: NA Discharge Planning Services: CM Consult Post Acute Care Choice: NA Living arrangements for the past 2 months: Single Family Home                 DME Arranged: N/A DME Agency: NA       HH Arranged: NA HH Agency: NA         Social Determinants of Health (SDOH) Interventions SDOH Screenings   Food Insecurity: No Food Insecurity (08/21/2023)  Housing: Low Risk  (08/21/2023)  Transportation Needs: No Transportation Needs (08/21/2023)  Utilities: Not At Risk (08/21/2023)  Alcohol  Screen: Low Risk  (07/30/2023)  Depression (PHQ2-9): Medium Risk (08/03/2023)  Financial Resource Strain: Low Risk  (07/30/2023)  Physical Activity: Inactive (07/30/2023)  Social Connections: Moderately Integrated (08/21/2023)  Stress: No Stress Concern Present (07/30/2023)  Tobacco Use: Medium Risk (08/20/2023)  Health Literacy: Adequate Health Literacy (10/29/2022)    Readmission Risk Interventions    08/25/2023    2:47 PM  Readmission Risk Prevention Plan   Transportation Screening Complete  PCP or Specialist Appt within 3-5 Days Complete  HRI or Home Care Consult Complete  Social Work Consult for Recovery Care Planning/Counseling Complete  Palliative Care Screening Complete  Medication Review [employer]) Complete

## 2023-09-06 NOTE — Plan of Care (Signed)
  Problem: Activity: Goal: Risk for activity intolerance will decrease Outcome: Progressing   Problem: Coping: Goal: Level of anxiety will decrease Outcome: Progressing   Problem: Elimination: Goal: Will not experience complications related to bowel motility Outcome: Progressing Goal: Will not experience complications related to urinary retention Outcome: Progressing   Problem: Pain Managment: Goal: General experience of comfort will improve and/or be controlled Outcome: Progressing

## 2023-09-06 NOTE — Plan of Care (Signed)
  Problem: Pain Managment: Goal: General experience of comfort will improve and/or be controlled Outcome: Progressing   Problem: Safety: Goal: Ability to remain free from injury will improve Outcome: Progressing   Problem: Skin Integrity: Goal: Risk for impaired skin integrity will decrease Outcome: Progressing

## 2023-09-06 NOTE — Progress Notes (Signed)
 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  Per spouse, patient has had poor appetite and weight loss over the last few weeks.  She was seen by her oncologist 2 weeks ago and was started on Marinol  and Megace  to help with appetite. Over the last few days, patient had been significantly weak and had difficulty getting in bed. UA at intake concerning for UTI. Hospitalist called for admission.   Patient remains medically stable for discharge once safe disposition is obtained. 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    Patient continues to decline - taking PO poorly even with assistance, concern this is progression of disease complicated by advanced dementia - as we continue to focus on comfort , would recommend against lab draws, procedures, repeat imaging, aggressive therapy unless the purpose/goal is to improve overall comfort/quality of life. Defer to oncology/radiation for ongoing testing/treatment per their expertise. Labs x1 am 6/30 in preparation for possible PET scan.  Assessment & Plan:   Principal Problem:   Generalized weakness Active Problems:   Hyperlipidemia   Essential hypertension   History of CVA (cerebrovascular accident)   Anemia associated with chemotherapy   Thrombocytopenia (HCC)   Breast cancer metastasized to bone, unspecified laterality (HCC)   UTI (urinary tract infection)   Pancytopenia (HCC)   DNR (do not resuscitate)   Dementia (HCC)    Weakness generalized   Counseling and coordination of care   Goals of care, counseling/discussion   Palliative care encounter   Metastatic malignant neoplasm [facility])   Concern about end of life   Pain   ACP (advance care planning)   Medication management   UTI, Acute cystitis: Complicated by immunocompromised state. - 5-day antibiotics course completed on 6/25. - Blood cultures negative so far, urine culture with multiple species.   Generalized weakness, FTT: - In the setting of advanced metastatic breast CA, stage IV . - Complicated by worsening pancytopenia/UTI - Transfused 1 unit packed RBCs on 6/21, H&H stable. - Continue Marinol , Megace , fall precautions - Palliative medicine, oncology following. - Care transitioned to full comfort care.   Goals of care  - Lengthy discussion with patient and her husband, again attempted to discuss prognosis/goals of care. - Patient's husband is fixated on repeat PET scan despite our discussion that included Dr Jessy note indicating there is no 'further therapy for her'. - At this time I would not recommend any further invasive testing and would focus on patient's comfort and quality of life  - I appreciate oncology and palliative care assistance with this conversation. Palliative care has changed the care to full comfort care.   Right hip pain in the setting of metastatic breast CA, metastasis: - Continue pain control. - Right hip x-ray shows sclerotic lesions, no obvious lytic lesion or bony destruction.  CT chest abdomen pelvis on 6/22 showed scattered sclerotic lesions in the bones slightly increased from 2024, no pathologic fractures. Would not repeat imaging given limited options for intervention at this time.   Pancytopenia with mild anemia, thrombocytopenia -  Hemoglobin 9.9 on admission and received 1 unit packed RBCs - repeat H&H > 11 - Avoid further blood products without overt symptoms or <8 given risk vs benefits.   Metastatic  breast cancer, Osseous metastases - Continue gabapentin , Megace , Marinol , as needed tramadol   - CT chest abdomen pelvis on 6/22 showed scattered sclerotic lesions in the bones slightly increased from 2024, no pathologic fractures, anasarca with moderate ascites, loculated moderate left pleural effusion - US  thoracentesis, likely malignant.  Currently no hypoxia or abdominal pain, hold off on paracentesis. - Oncology, palliative medicine following, recommended DNR.   Left moderate pleural effusion, also chronic pleural effusion - Likely secondary to breast malignancy - CT chest abdomen pelvis showed moderate loculated left pleural effusion and small right pleural effusion, emphysema, moderate ascites and anasarca - US  guided left thoracentesis on 6/23, 720 cc removed.   Atrial fibrillation, rate controlled: - HR stable, continue Lopressor . - Hold Eliquis  in the setting of thrombocytopenia    Chronic diastolic HF - Continue on digoxin , Lasix  and Lopressor  - Echo showed EF 60-65%, no regional WMA, left ventricular diastolic function could not be evaluated, RV SF, massive biatrial dilation suspect secondary to underlying A-fib but could consider workup for infiltrative cardiomyopathy, left atrial size severely dilated, moderate AR - Cardiology consulted   Hypophosphatemia - Continue replacement.  HLD - Continue atorvastatin .  Hypothyroidism - Continue Armour - TSH 5.9  Seizure disorder - Continue Keppra   GERD - Continue Protonix .  Dementia - Continue Aricept  and memantine .  Asthma - No wheezing, continue home bronchodilators.   DVT prophylaxis: SCDs Code Status: DNR-Comfort care Family Communication: No family at bed side Disposition Plan:    Status is: Inpatient Remains inpatient appropriate because: Inpatient severity of illness.  Transition to full comfort focused care at this time.   Consultants:  Palliative care  Procedures:  Antimicrobials:  Anti-infectives (From  admission, onward)    Start     Dose/Rate Route Frequency Ordered Stop   08/21/23 2000  cefTRIAXone  (ROCEPHIN ) 1 g in sodium chloride  0.9 % 100 mL IVPB        1 g 200 mL/hr over 30 Minutes Intravenous Every 24 hours 08/21/23 1821 08/25/23 2200       Subjective: Patient was seen and examined at bedside.  Overnight events noted. Patient appears comfortable, very deconditioned, sick looking,  following commands. She is awaiting placement in a long-term facility with hospice services  Objective: Vitals:   09/05/23 0644 09/05/23 1300 09/06/23 0459 09/06/23 1151  BP:  118/74 123/80 110/71  Pulse:  87 70 88  Resp:  20 18   Temp:  97.8 F (36.6 C) 97.8 F (36.6 C) 97.7 F (36.5 C)  TempSrc:  Oral Oral Oral  SpO2: 95% 96% 92% 94%  Weight:      Height:        Intake/Output Summary (Last 24 hours) at 09/06/2023 1621 Last data filed at 09/06/2023 1037 Gross per 24 hour  Intake 420 ml  Output --  Net 420 ml    Filed Weights   08/28/23 0500 08/29/23 0623 08/30/23 0507  Weight: 51.6 kg 51.6 kg 53.2 kg    Examination:  General exam: Appears comfortable, severely deconditioned, Sick looking, not in acute distress. Respiratory system: CTA Bilaterally . Respiratory effort normal. RR 14 Cardiovascular system: S1 & S2 heard, No JVD, murmurs, rubs, gallops or clicks. No pedal edema. Gastrointestinal system: Abdomen is non distended, soft and non tender.  Normal bowel sounds heard. Central nervous system: Confused,  not following commands. No focal neurological deficits. Extremities: No edema, no cyanosis, no clubbing Skin: No rashes, lesions or ulcers Psychiatry: Mood & affect appropriate.    Data Reviewed: I have personally reviewed following labs and imaging studies  CBC: Recent Labs  Lab 09/05/23 0547  WBC 4.8  NEUTROABS 3.4  HGB 11.8*  HCT 33.7*  MCV 98.8  PLT 112*   Basic Metabolic Panel: Recent Labs  Lab 09/05/23 0547  NA 135  K 3.4*  CL 101  CO2 24  GLUCOSE 93   BUN 16  CREATININE 0.60  CALCIUM  7.6*   GFR: Estimated Creatinine Clearance: 38.9 mL/min (by C-G formula based on SCr of 0.6 mg/dL). Liver Function Tests: Recent Labs  Lab 09/05/23 0547  AST 57*  ALT 48*  ALKPHOS 75  BILITOT 1.3*  PROT 5.5*  ALBUMIN  2.7*   No results for input(s): LIPASE, AMYLASE in the last 168 hours. No results for input(s): AMMONIA in the last 168 hours. Coagulation Profile: No results for input(s): INR, PROTIME in the last 168 hours. Cardiac Enzymes: No results for input(s): CKTOTAL, CKMB, CKMBINDEX, TROPONINI in the last 168 hours. BNP (last 3 results) No results for input(s): PROBNP in the last 8760 hours. HbA1C: No results for input(s): HGBA1C in the last 72 hours. CBG: No results for input(s): GLUCAP in the last 168 hours. Lipid Profile: No results for input(s): CHOL, HDL, LDLCALC, TRIG, CHOLHDL, LDLDIRECT in the last 72 hours. Thyroid  Function Tests: No results for input(s): TSH, T4TOTAL, FREET4, T3FREE, THYROIDAB in the last 72 hours. Anemia Panel: No results for input(s): VITAMINB12, FOLATE, FERRITIN, TIBC, IRON , RETICCTPCT in the last 72 hours. Sepsis Labs: No results for input(s): PROCALCITON, LATICACIDVEN in the last 168 hours.  No results found for this or any previous visit (from the past 240 hours).   Radiology Studies: No results found.  Scheduled Meds:  digoxin   0.125 mg Oral Daily   fluticasone   1 spray Each Nare BID   fluticasone  furoate-vilanterol  1 puff Inhalation Daily   furosemide   40 mg Oral BID   gabapentin   400 mg Oral BID   latanoprost   1 drop Both Eyes BID   levETIRAcetam   500 mg Oral QHS   pantoprazole   40 mg Oral Daily   sodium chloride  flush  10-40 mL Intracatheter Q12H   spironolactone   25 mg Oral Daily   thyroid   90 mg Oral Daily   Continuous Infusions:   LOS: 15 days    Time spent: 35 mins    Darcel Dawley, MD [facility]   If 7PM-7AM, please contact night-coverage

## 2023-09-07 ENCOUNTER — Encounter: Payer: Self-pay | Admitting: Hematology & Oncology

## 2023-09-07 DIAGNOSIS — R531 Weakness: Secondary | ICD-10-CM | POA: Diagnosis not present

## 2023-09-07 NOTE — Progress Notes (Signed)
 Daily Progress Note   Patient Name: Patricia [name]       Date: 09/07/2023 DOB: [birth year]  Age: 83 y.o. MRN#: [id number] Attending Physician: Leotis Bogus, MD Primary Care Physician: Gayle Saddie JULIANNA DEVONNA Admit Date: 08/20/2023 Length of Stay: 16 days  Reason for Consultation/Follow-up: Establishing goals of care  Subjective:   Reviewed EMR including recent documentation from hospitalist.  Patient continues to receive comfort focused care while awaiting on long-term care placement with hospice layered in.  Patient resting in bed, appears weak, no acute symptoms, medication history noted.  Vital Signs:  BP 98/64 (BP Location: Right Arm)   Pulse 80   Temp 98.4 F (36.9 C) (Oral)   Resp 17   Ht 5' (1.524 m)   Wt 53.2 kg   SpO2 94%   BMI 22.91 kg/m   Physical Exam: General: Appears comfortable, NAD, unable to participate in complex medical decision-making, cachectic, frail Cardiovascular: RRR Respiratory: no increased work of breathing noted, not in respiratory distress Extremities: Muscle wasting present in all extremities  Assessment & Plan:   Assessment: Patient is an 83 year old female with a past medical history of metastatic breast cancer with disease to spine, A-fib on Eliquis , HFpEF, dementia, hypothyroidism, hyperlipidemia, pulmonary hypertension, CVA, pleural effusion, seizures, asthma, and GERD who was admitted on 08/20/2023 for management of generalized weakness and right hip pain. Oncology consulted for recommendations. Palliative medicine team consulted to assist with complex medical decision making.   Recommendations/Plan: # Complex medical decision making/goals of care:     -Patient unable to participate in complex medical decision-making due to underlying medical status.  Patient has underlying dementia.      -   Patient was already transitioned to full comfort focused care on 7/2.  Patient is not a candidate for inpatient hospice.  Seeking long-term care placement  with [facility] of [facility].  Awaiting disposition.  Palliative medicine team will be available as needed.              .  Focused on symptom management of pain, dyspnea, and agitation in the setting of end-of-life care.    Code Status: Do not attempt resuscitation (DNR) - Comfort care  # Symptom management Patient is receiving these palliative interventions for symptom management with an intent to improve quality of life.     -Pain/Dyspnea, acute in the setting of end-of-life care   - Continue tramadol  50 mg every 6 hours as needed - one to two doses used in the past 24 hours.    - Continue IV morphine  1-2 mg every 4 hours as needed breakthrough only after given oral tramadol     - Continue gabapentin  for 100 mg twice daily                 -Anxiety/agitation, in the setting of end-of-life care                               - Continue as needed Haldol , continue to adjust based on patient's symptom burden                  -Secretions, in the setting of end-of-life care                               - Continue as needed glycopyrrolate , continue to adjust based on patient's symptom burden  # Psycho-social/Spiritual Support:  -  Support System: Husband, son, daughter   # Discharge Planning: [facility] facility with [facility] hospice support. Discussed with TOC.   Thank you for allowing the palliative care team to participate in the care Ronal Primrose.  Palliative medicine team will continue to follow along peripherally.  Please reach out if acute PMT needs arise.  mod MDM Lonia Serve MD.  Palliative Care Provider PMT # [phone]  If patient remains symptomatic despite maximum doses, please call PMT at [phone] between 0700 and 1900. Outside of these hours, please call attending, as PMT does not have night coverage.

## 2023-09-07 NOTE — Plan of Care (Signed)

## 2023-09-07 NOTE — Progress Notes (Signed)
 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  Per spouse, patient has had poor appetite and weight loss over the last few weeks.  She was seen by her oncologist 2 weeks ago and was started on Marinol  and Megace  to help with appetite. Over the last few days, patient had been significantly weak and had difficulty getting in bed. UA at intake concerning for UTI. Hospitalist called for admission.   Patient remains medically stable for discharge once safe disposition is obtained. 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    Patient continues to decline - taking PO poorly even with assistance, concern this is progression of disease complicated by advanced dementia - as we continue to focus on comfort , would recommend against lab draws, procedures, repeat imaging, aggressive therapy unless the purpose/goal is to improve overall comfort/quality of life. Defer to oncology/radiation for ongoing testing/treatment per their expertise. Labs x1 am 6/30 in preparation for possible PET scan.  Plan: Care transitioned to full comfort care.  Patient is awaiting placement in a long-term facility.  Which requires TB test to be completed before discharge. Patient is awaiting QuantiFERON gold test results.  Assessment & Plan:   Principal Problem:   Generalized weakness Active Problems:   Hyperlipidemia   Essential hypertension   History of CVA (cerebrovascular accident)   Anemia associated with  chemotherapy   Thrombocytopenia (HCC)   Breast cancer metastasized to bone, unspecified laterality (HCC)   UTI (urinary tract infection)   Pancytopenia (HCC)   DNR (do not resuscitate)   Dementia (HCC)   Weakness generalized   Counseling and coordination of care   Goals of care, counseling/discussion   Palliative care encounter   Metastatic malignant neoplasm [facility])   Concern about end of life   Pain   ACP (advance care planning)   Medication management   UTI, Acute cystitis: Completed antibiotic course. - Blood cultures negative so far, urine culture with multiple species.   Generalized weakness, FTT: - In the setting of advanced metastatic breast CA, stage IV . - Continue Marinol , Megace , fall precautions - Palliative medicine, oncology following. - Care transitioned to full comfort care.   Goals of care  - Lengthy discussion with patient and her husband, again attempted to discuss prognosis/goals of care. - I appreciate oncology and palliative care assistance with this conversation. -Palliative care has changed the care to full comfort care.   Right hip pain in the setting of metastatic breast CA, metastasis: - Continue pain control. - Right hip x-ray shows sclerotic lesions, no obvious lytic lesion or bony destruction.  CT chest abdomen pelvis on 6/22 showed scattered sclerotic lesions in the bones slightly increased from 2024, no pathologic fractures. Would not repeat imaging given limited options for intervention at this time.   Pancytopenia with mild anemia, thrombocytopenia - Hemoglobin 9.9 on admission and received 1 unit packed RBCs - repeat H&H > 11 - Avoid further blood products without overt symptoms or <8 given risk vs benefits.   Metastatic breast cancer, Osseous metastases -  Continue gabapentin , Megace , Marinol , as needed tramadol   - CT chest abdomen pelvis on 6/22 showed scattered sclerotic lesions in the bones slightly increased from 2024, no pathologic  fractures, anasarca with moderate ascites, loculated moderate left pleural effusion - US  thoracentesis, likely malignant.  Currently no hypoxia or abdominal pain, hold off on paracentesis. - Oncology, palliative medicine following, recommended DNR.   Left moderate pleural effusion, also chronic pleural effusion - Likely secondary to breast malignancy - CT chest abdomen pelvis showed moderate loculated left pleural effusion and small right pleural effusion, emphysema, moderate ascites and anasarca - US  guided left thoracentesis on 6/23, 720 cc removed.   Atrial fibrillation, rate controlled: - HR stable, continue Lopressor . - Hold Eliquis  in the setting of thrombocytopenia    Chronic diastolic HF - Continue on digoxin , Lasix  and Lopressor  - Echo showed EF 60-65%, no regional WMA, left ventricular diastolic function could not be evaluated, RV SF, massive biatrial dilation suspect secondary to underlying A-fib but could consider workup for infiltrative cardiomyopathy, left atrial size severely dilated, moderate AR - Cardiology consulted   Hypophosphatemia - Continue replacement. HLD - Continue atorvastatin . Hypothyroidism - Continue Armour - TSH 5.9 Seizure disorder - Continue Keppra  GERD - Continue Protonix . Dementia - Continue Aricept  and memantine . Asthma - No wheezing, continue home bronchodilators.   DVT prophylaxis: SCDs Code Status: DNR-Comfort care Family Communication: No family at bed side Disposition Plan:    Status is: Inpatient Remains inpatient appropriate because:    Care transitioned to full comfort care.  Patient is awaiting placement in a long-term facility.  Which requires TB test to be completed before discharge. Patient is awaiting QuantiFERON gold test results.  Consultants:  Palliative care  Procedures:  Antimicrobials:  Anti-infectives (From admission, onward)    Start     Dose/Rate Route Frequency Ordered Stop   08/21/23 2000  cefTRIAXone   (ROCEPHIN ) 1 g in sodium chloride  0.9 % 100 mL IVPB        1 g 200 mL/hr over 30 Minutes Intravenous Every 24 hours 08/21/23 1821 08/25/23 2200       Subjective: Patient was seen and examined at bedside.  Overnight events noted. Patient appears comfortable, very deconditioned, sick looking,  following commands. She is awaiting placement in a long-term facility with hospice services.  Objective: Vitals:   09/06/23 2011 09/06/23 2012 09/07/23 0432 09/07/23 0905  BP: (!) 136/111 136/70 98/64   Pulse: (!) 116 (!) 104 92 80  Resp: 17  17   Temp: 99.1 F (37.3 C)  98.4 F (36.9 C)   TempSrc: Oral  Oral   SpO2: 93% 93% 94%   Weight:      Height:        Intake/Output Summary (Last 24 hours) at 09/07/2023 1352 Last data filed at 09/07/2023 1010 Gross per 24 hour  Intake 430 ml  Output --  Net 430 ml    Filed Weights   08/28/23 0500 08/29/23 0623 08/30/23 0507  Weight: 51.6 kg 51.6 kg 53.2 kg    Examination:  General exam: Appears comfortable, severely deconditioned, Sick looking, not in acute distress. Respiratory system: CTA Bilaterally . Respiratory effort normal. RR 14 Cardiovascular system: S1 & S2 heard, No JVD, murmurs, rubs, gallops or clicks. No pedal edema. Gastrointestinal system: Abdomen is non distended, soft and non tender.  Normal bowel sounds heard. Central nervous system: Confused, not following commands. No focal neurological deficits. Extremities: No edema, no cyanosis, no clubbing Skin: No rashes, lesions or ulcers Psychiatry: Mood &  affect appropriate.    Data Reviewed: I have personally reviewed following labs and imaging studies  CBC: Recent Labs  Lab 09/05/23 0547  WBC 4.8  NEUTROABS 3.4  HGB 11.8*  HCT 33.7*  MCV 98.8  PLT 112*   Basic Metabolic Panel: Recent Labs  Lab 09/05/23 0547  NA 135  K 3.4*  CL 101  CO2 24  GLUCOSE 93  BUN 16  CREATININE 0.60  CALCIUM  7.6*   GFR: Estimated Creatinine Clearance: 38.9 mL/min (by C-G  formula based on SCr of 0.6 mg/dL). Liver Function Tests: Recent Labs  Lab 09/05/23 0547  AST 57*  ALT 48*  ALKPHOS 75  BILITOT 1.3*  PROT 5.5*  ALBUMIN  2.7*   No results for input(s): LIPASE, AMYLASE in the last 168 hours. No results for input(s): AMMONIA in the last 168 hours. Coagulation Profile: No results for input(s): INR, PROTIME in the last 168 hours. Cardiac Enzymes: No results for input(s): CKTOTAL, CKMB, CKMBINDEX, TROPONINI in the last 168 hours. BNP (last 3 results) No results for input(s): PROBNP in the last 8760 hours. HbA1C: No results for input(s): HGBA1C in the last 72 hours. CBG: No results for input(s): GLUCAP in the last 168 hours. Lipid Profile: No results for input(s): CHOL, HDL, LDLCALC, TRIG, CHOLHDL, LDLDIRECT in the last 72 hours. Thyroid  Function Tests: No results for input(s): TSH, T4TOTAL, FREET4, T3FREE, THYROIDAB in the last 72 hours. Anemia Panel: No results for input(s): VITAMINB12, FOLATE, FERRITIN, TIBC, IRON , RETICCTPCT in the last 72 hours. Sepsis Labs: No results for input(s): PROCALCITON, LATICACIDVEN in the last 168 hours.  No results found for this or any previous visit (from the past 240 hours).   Radiology Studies: No results found.  Scheduled Meds:  digoxin   0.125 mg Oral Daily   fluticasone   1 spray Each Nare BID   furosemide   40 mg Oral BID   gabapentin   400 mg Oral BID   latanoprost   1 drop Both Eyes BID   levETIRAcetam   500 mg Oral QHS   pantoprazole   40 mg Oral Daily   sodium chloride  flush  10-40 mL Intracatheter Q12H   spironolactone   25 mg Oral Daily   thyroid   90 mg Oral Daily   Continuous Infusions:   LOS: 16 days    Time spent: 35 mins    Darcel Dawley, MD [facility]   If 7PM-7AM, please contact night-coverage

## 2023-09-07 NOTE — Plan of Care (Signed)
  Problem: Education: Goal: Knowledge of General Education information will improve Description: Including pain rating scale, medication(s)/side effects and non-pharmacologic comfort measures Outcome: Not Progressing   Problem: Health Behavior/Discharge Planning: Goal: Ability to manage health-related needs will improve Outcome: Not Progressing   Problem: Clinical Measurements: Goal: Ability to maintain clinical measurements within normal limits will improve Outcome: Not Progressing Goal: Will remain free from infection Outcome: Not Progressing Goal: Diagnostic test results will improve Outcome: Not Progressing Goal: Respiratory complications will improve Outcome: Not Progressing Goal: Cardiovascular complication will be avoided Outcome: Not Progressing   Problem: Activity: Goal: Risk for activity intolerance will decrease Outcome: Not Progressing   Problem: Nutrition: Goal: Adequate nutrition will be maintained Outcome: Not Progressing   Problem: Coping: Goal: Level of anxiety will decrease Outcome: Not Progressing   Problem: Elimination: Goal: Will not experience complications related to bowel motility Outcome: Not Progressing Goal: Will not experience complications related to urinary retention Outcome: Not Progressing   Problem: Pain Managment: Goal: General experience of comfort will improve and/or be controlled Outcome: Not Progressing   Problem: Safety: Goal: Ability to remain free from injury will improve Outcome: Not Progressing   Problem: Skin Integrity: Goal: Risk for impaired skin integrity will decrease Outcome: Not Progressing   Problem: Education: Goal: Knowledge of the prescribed therapeutic regimen will improve Outcome: Not Progressing   Problem: Coping: Goal: Ability to identify and develop effective coping behavior will improve Outcome: Not Progressing   Problem: Clinical Measurements: Goal: Quality of life will improve Outcome: Not  Progressing   Problem: Respiratory: Goal: Verbalizations of increased ease of respirations will increase Outcome: Not Progressing   Problem: Role Relationship: Goal: Family's ability to cope with current situation will improve Outcome: Not Progressing Goal: Ability to verbalize concerns, feelings, and thoughts to partner or family member will improve Outcome: Not Progressing   Problem: Pain Management: Goal: Satisfaction with pain management regimen will improve Outcome: Not Progressing

## 2023-09-08 DIAGNOSIS — R531 Weakness: Secondary | ICD-10-CM | POA: Diagnosis not present

## 2023-09-08 LAB — QUANTIFERON-TB GOLD PLUS: QuantiFERON-TB Gold Plus: NEGATIVE

## 2023-09-08 LAB — QUANTIFERON-TB GOLD PLUS (RQFGPL)
QuantiFERON Mitogen Value: 1.31 [IU]/mL
QuantiFERON Nil Value: 0.26 [IU]/mL
QuantiFERON TB1 Ag Value: 0.27 [IU]/mL
QuantiFERON TB2 Ag Value: 0.27 [IU]/mL

## 2023-09-08 NOTE — Plan of Care (Signed)

## 2023-09-08 NOTE — Progress Notes (Signed)
 Daily Progress Note   Patient Name: Patricia [name]       Date: 09/08/2023 DOB: Jul 18, 1940  Age: 83 y.o. MRN#: [id number] Attending Physician: Will Almarie MATSU, MD Primary Care Physician: Gayle Saddie JULIANNA DEVONNA Admit Date: 08/20/2023 Length of Stay: 17 days  Reason for Consultation/Follow-up: Establishing goals of care  Subjective:   Reviewed EMR including recent documentation from hospitalist.  Patient continues to receive comfort focused care while awaiting on long-term care placement with hospice layered in.  Patient resting in bed, appears weak, no acute symptoms, medication history noted. Awake alert, interactive with nursing staff, tolerating some PO.   Vital Signs:  BP (!) 145/83 (BP Location: Right Arm)   Pulse 98   Temp 97.8 F (36.6 C) (Oral)   Resp 16   Ht 5' (1.524 m)   Wt 53.2 kg   SpO2 95%   BMI 22.91 kg/m   Physical Exam: General: Appears comfortable, NAD, unable to participate in complex medical decision-making, cachectic, frail Cardiovascular: RRR Respiratory: no increased work of breathing noted, not in respiratory distress Extremities: Muscle wasting present in all extremities  Assessment & Plan:   Assessment: Patient is an 83 year old female with a past medical history of metastatic breast cancer with disease to spine, A-fib on Eliquis , HFpEF, dementia, hypothyroidism, hyperlipidemia, pulmonary hypertension, CVA, pleural effusion, seizures, asthma, and GERD who was admitted on 08/20/2023 for management of generalized weakness and right hip pain. Oncology consulted for recommendations. Palliative medicine team consulted to assist with complex medical decision making.   Recommendations/Plan: # Complex medical decision making/goals of care:     -Patient unable to participate in complex medical decision-making due to underlying medical status.  Patient has underlying dementia.      -   Patient was already transitioned to full comfort focused care on 7/2.  Patient  is not a candidate for inpatient hospice.  Seeking long-term care placement with addition of [facility].  Awaiting disposition.  Palliative medicine team will be available as needed.              .  Focused on symptom management of pain, dyspnea, and agitation in the setting of end-of-life care.    Code Status: Do not attempt resuscitation (DNR) - Comfort care  # Symptom management Patient is receiving these palliative interventions for symptom management with an intent to improve quality of life.     -Pain/Dyspnea, acute in the setting of end-of-life care   - Continue tramadol  50 mg every 6 hours as needed - one to two doses used in the past 24 hours.    - Continue IV morphine  1-2 mg every 4 hours as needed breakthrough only after given oral tramadol     - Continue gabapentin  for 100 mg twice daily                 -Anxiety/agitation, in the setting of end-of-life care                               - Continue as needed Haldol , continue to adjust based on patient's symptom burden                  -Secretions, in the setting of end-of-life care                               - Continue as needed glycopyrrolate , continue to  adjust based on patient's symptom burden  # Psycho-social/Spiritual Support:  - Support System: Husband, son, daughter   # Discharge Planning: [facility] facility with [facility] hosp[facility] support. Discussed with TOC on 09-07-23.   Thank you for allowing the palliative care team to participate in the care Ronal Primrose.  Palliative medicine team will continue to follow along peripherally.  Please reach out if acute PMT needs arise.  low MDM Lonia Serve MD.  Palliative Care Provider PMT # [phone]  If patient remains symptomatic despite maximum doses, please call PMT at [phone] between 0700 and 1900. Outside of these hours, please call attending, as PMT does not have night coverage.

## 2023-09-08 NOTE — TOC Progression Note (Addendum)
 Transition of Care [facility]) - Progression Note    Patient Details  Name: Patricia [name] MRN: [id number] Date of Birth: 12/26/1940  [facility] CM/SW Contact  Toy LITTIE Agar, RN Phone Number:[phone]  09/08/2023, 1:22 PM  Clinical Narrative:    TB test remains pending for ALF placement at [facility]. FL2 and diet order have been faxed to [facility] with [facility] per request.   1512 CM received call from spouse with update. Spouse states that he is suppose to meet [facility] nurse at [facility] tomorrow at 2pm and DME is suppose to be delivered between 10-12 tomorrow. CM will continue to follow.   Expected Discharge Plan: Long Term Nursing Home Barriers to Discharge: Continued Medical Work up, Other (must enter comment) (Requires long term care)  Expected Discharge Plan and Services In-house Referral: NA Discharge Planning Services: CM Consult Post Acute Care Choice: NA Living arrangements for the past 2 months: Single Family Home                 DME Arranged: N/A DME Agency: NA       HH Arranged: NA HH Agency: NA         Social Determinants of Health (SDOH) Interventions SDOH Screenings   Food Insecurity: No Food Insecurity (08/21/2023)  Housing: Low Risk  (08/21/2023)  Transportation Needs: No Transportation Needs (08/21/2023)  Utilities: Not At Risk (08/21/2023)  Alcohol  Screen: Low Risk  (07/30/2023)  Depression (PHQ2-9): Medium Risk (08/03/2023)  Financial Resource Strain: Low Risk  (07/30/2023)  Physical Activity: Inactive (07/30/2023)  Social Connections: Moderately Integrated (08/21/2023)  Stress: No Stress Concern Present (07/30/2023)  Tobacco Use: Medium Risk (08/20/2023)  Health Literacy: Adequate Health Literacy (10/29/2022)    Readmission Risk Interventions    08/25/2023    2:47 PM  Readmission Risk Prevention Plan  Transportation Screening Complete  PCP or Specialist Appt within 3-5 Days Complete  HRI or Home Care Consult Complete  Social  Work Consult for Recovery Care Planning/Counseling Complete  Palliative Care Screening Complete  Medication Review Oceanographer) Complete

## 2023-09-08 NOTE — Progress Notes (Signed)
 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  Per spouse, patient has had poor appetite and weight loss over the last few weeks.  She was seen by her oncologist 2 weeks ago and was started on Marinol  and Megace  to help with appetite. Over the last few days, patient had been significantly weak and had difficulty getting in bed. UA at intake concerning for UTI. Hospitalist called for admission.   Patient remains medically stable for discharge once safe disposition is obtained. 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    Patient continues to decline - taking PO poorly even with assistance, concern this is progression of disease complicated by advanced dementia - as we continue to focus on comfort , would recommend against lab draws, procedures, repeat imaging, aggressive therapy unless the purpose/goal is to improve overall comfort/quality of life. Defer to oncology/radiation for ongoing testing/treatment per their expertise. Labs x1 am 6/30 in preparation for possible PET scan.   Plan: Care transitioned to full comfort care.  Patient is awaiting placement in a long-term facility.  Which requires TB test to be completed before discharge. Patient is awaiting QuantiFERON gold test results. Assessment & Plan:   Principal Problem:   Generalized weakness Active Problems:   Hyperlipidemia   Essential hypertension   History of CVA (cerebrovascular accident)   Anemia associated with  chemotherapy   Thrombocytopenia (HCC)   Breast cancer metastasized to bone, unspecified laterality (HCC)   UTI (urinary tract infection)   Pancytopenia (HCC)   DNR (do not resuscitate)   Dementia (HCC)   Weakness generalized   Counseling and coordination of care   Goals of care, counseling/discussion   Palliative care encounter   Metastatic malignant neoplasm [facility])   Concern about end of life   Pain   ACP (advance care planning)   Medication management     UTI, Acute cystitis: Completed antibiotic course. - Blood cultures negative so far, urine culture with multiple species.   Generalized weakness, FTT: - In the setting of advanced metastatic breast CA, stage IV . - Continue Marinol , Megace , fall precautions - Palliative medicine, oncology following. - Care transitioned to full comfort care.   Goals of care  - Lengthy discussion with patient and her husband, again attempted to discuss prognosis/goals of care. - I appreciate oncology and palliative care assistance with this conversation. -Palliative care has changed the care to full comfort care.   Right hip pain in the setting of metastatic breast CA, metastasis: - Continue pain control. - Right hip x-ray shows sclerotic lesions, no obvious lytic lesion or bony destruction.  CT chest abdomen pelvis on 6/22 showed scattered sclerotic lesions in the bones slightly increased from 2024, no pathologic fractures. Would not repeat imaging given limited options for intervention at this time.   Pancytopenia with mild anemia, thrombocytopenia - Hemoglobin 9.9 on admission and received 1 unit packed RBCs - repeat H&H > 11 - Avoid further blood products without overt symptoms or <8 given risk vs benefits.   Metastatic breast cancer, Osseous metastases -  Continue gabapentin , Megace , Marinol , as needed tramadol   - CT chest abdomen pelvis on 6/22 showed scattered sclerotic lesions in the bones slightly increased from 2024, no pathologic  fractures, anasarca with moderate ascites, loculated moderate left pleural effusion - US  thoracentesis, likely malignant.  Currently no hypoxia or abdominal pain, hold off on paracentesis. - Oncology, palliative medicine following, recommended DNR.   Left moderate pleural effusion, also chronic pleural effusion - Likely secondary to breast malignancy - CT chest abdomen pelvis showed moderate loculated left pleural effusion and small right pleural effusion, emphysema, moderate ascites and anasarca - US  guided left thoracentesis on 6/23, 720 cc removed.    Atrial fibrillation, rate controlled: - HR stable, continue Lopressor . - Hold Eliquis  in the setting of thrombocytopenia    Chronic diastolic HF - Continue on digoxin , Lasix  and Lopressor  - Echo showed EF 60-65%, no regional WMA, left ventricular diastolic function could not be evaluated, RV SF, massive biatrial dilation suspect secondary to underlying A-fib but could consider workup for infiltrative cardiomyopathy, left atrial size severely dilated, moderate AR - Cardiology consulted  HLD - Continue atorvastatin . Hypothyroidism - Continue Armour - TSH 5.9 Seizure disorder - Continue Keppra  GERD - Continue Protonix . Dementia - Continue Aricept  and memantine . Asthma - No wheezing, continue home bronchodilators.   Nutrition Problem: Increased nutrient needs Etiology: cancer and cancer related treatments  Signs/Symptoms: estimated needs  Interventions: Ensure Enlive (each supplement provides 350kcal and 20 grams of protein), MVI  Estimated body mass index is 22.91 kg/m as calculated from the following:   Height as of this encounter: 5' (1.524 m).   Weight as of this encounter: 53.2 kg.  DVT prophylaxis: lovenox  Code Status: dnr Family Communication: none Disposition Plan:  Status is: Inpatient stable for dc Await quantiferon gold   Consultants: palliative  Procedures: none Antimicrobials:  Anti-infectives (From admission,  onward)    Start     Dose/Rate Route Frequency Ordered Stop   08/21/23 2000  cefTRIAXone  (ROCEPHIN ) 1 g in sodium chloride  0.9 % 100 mL IVPB        1 g 200 mL/hr over 30 Minutes Intravenous Every 24 hours 08/21/23 1821 08/25/23 2200       Subjective:  No overnight events Objective: Vitals:   09/06/23 2012 09/07/23 0432 09/07/23 0905 09/08/23 0611  BP: 136/70 98/64  (!) 145/83  Pulse: (!) 104 92 80 98  Resp:  17  16  Temp:  98.4 F (36.9 C)  97.8 F (36.6 C)  TempSrc:  Oral  Oral  SpO2: 93% 94%  95%  Weight:      Height:        Intake/Output Summary (Last 24 hours) at 09/08/2023 1234 Last data filed at 09/08/2023 0856 Gross per 24 hour  Intake 120 ml  Output --  Net 120 ml   Filed Weights   08/28/23 0500 08/29/23 0623 08/30/23 0507  Weight: 51.6 kg 51.6 kg 53.2 kg    Examination:  General exam: Appears in nad Respiratory system: Clear to auscultation. Respiratory effort normal. Cardiovascular system: S1 & S2 heard, RRR. No JVD, murmurs, rubs, gallops or clicks. No pedal edema. Gastrointestinal system: Abdomen is nondistended, soft and nontender. No organomegaly or masses felt. Normal bowel sounds heard. Central nervous system: Alert and oriented. No focal neurological deficits. Extremities: no edema   Data Reviewed: I have personally reviewed following labs and imaging studies  CBC: Recent Labs  Lab 09/05/23 0547  WBC 4.8  NEUTROABS 3.4  HGB 11.8*  HCT 33.7*  MCV 98.8  PLT 112*   Basic Metabolic Panel: Recent Labs  Lab 09/05/23 0547  NA 135  K 3.4*  CL 101  CO2 24  GLUCOSE 93  BUN 16  CREATININE 0.60  CALCIUM  7.6*   GFR: Estimated Creatinine Clearance: 38.9 mL/min (by C-G formula based on SCr of 0.6 mg/dL). Liver Function Tests: Recent Labs  Lab 09/05/23 0547  AST 57*  ALT 48*  ALKPHOS 75  BILITOT 1.3*  PROT 5.5*  ALBUMIN  2.7*   No results for input(s): LIPASE, AMYLASE in the last 168 hours. No results for input(s): AMMONIA in  the last 168 hours. Coagulation Profile: No results for input(s): INR, PROTIME in the last 168 hours. Cardiac Enzymes: No results for input(s): CKTOTAL, CKMB, CKMBINDEX, TROPONINI in the last 168 hours. BNP (last 3 results) No results for input(s): PROBNP in the last 8760 hours. HbA1C: No results for input(s): HGBA1C in the last 72 hours. CBG: No results for input(s): GLUCAP in the last 168 hours. Lipid Profile: No results for input(s): CHOL, HDL, LDLCALC, TRIG, CHOLHDL, LDLDIRECT in the last 72 hours. Thyroid  Function Tests: No results for input(s): TSH, T4TOTAL, FREET4, T3FREE, THYROIDAB in the last 72 hours. Anemia Panel: No results for input(s): VITAMINB12, FOLATE, FERRITIN, TIBC, IRON , RETICCTPCT in the last 72 hours. Sepsis Labs: No results for input(s): PROCALCITON, LATICACIDVEN in the last 168 hours.  No results found for this or any previous visit (from the past 240 hours).    Radiology Studies: No results found.   Scheduled Meds:  digoxin   0.125 mg Oral Daily   fluticasone   1 spray Each Nare BID   furosemide   40 mg Oral BID   gabapentin   400 mg Oral BID   latanoprost   1 drop Both Eyes BID   levETIRAcetam   500 mg Oral QHS   pantoprazole   40 mg Oral Daily   sodium chloride  flush  10-40 mL Intracatheter Q12H   spironolactone   25 mg Oral Daily   thyroid   90 mg Oral Daily   Continuous Infusions:   LOS: 17 days    Time spent: 30 min  Patricia KANDICE Hoots, MD  09/08/2023, 12:34 PM

## 2023-09-08 NOTE — Plan of Care (Signed)
  Problem: Education: Goal: Knowledge of General Education information will improve Description: Including pain rating scale, medication(s)/side effects and non-pharmacologic comfort measures Outcome: Progressing   Problem: Clinical Measurements: Goal: Respiratory complications will improve Outcome: Progressing   Problem: Coping: Goal: Level of anxiety will decrease Outcome: Progressing   Problem: Elimination: Goal: Will not experience complications related to bowel motility Outcome: Progressing Goal: Will not experience complications related to urinary retention Outcome: Progressing

## 2023-09-09 DIAGNOSIS — R531 Weakness: Secondary | ICD-10-CM | POA: Diagnosis not present

## 2023-09-09 MED ORDER — GLYCOPYRROLATE 1 MG PO TABS: 1.0000 mg | ORAL_TABLET | ORAL | 0 refills | Status: DC | PRN

## 2023-09-09 MED ORDER — TRAMADOL HCL 50 MG PO TABS: 50.0000 mg | ORAL_TABLET | Freq: Four times a day (QID) | ORAL | 0 refills | Status: DC | PRN

## 2023-09-09 MED ORDER — ONDANSETRON HCL 4 MG PO TABS: 4.0000 mg | ORAL_TABLET | Freq: Four times a day (QID) | ORAL | 0 refills | Status: DC | PRN

## 2023-09-09 MED ORDER — ACETAMINOPHEN 325 MG PO TABS: 650.0000 mg | ORAL_TABLET | Freq: Four times a day (QID) | ORAL | Status: DC | PRN

## 2023-09-09 MED ORDER — SENNOSIDES-DOCUSATE SODIUM 8.6-50 MG PO TABS: 1.0000 | ORAL_TABLET | Freq: Every evening | ORAL | Status: DC | PRN

## 2023-09-09 MED ORDER — HALOPERIDOL 0.5 MG PO TABS: 0.5000 mg | ORAL_TABLET | ORAL | 0 refills | Status: DC | PRN

## 2023-09-09 NOTE — Progress Notes (Signed)
 Daily Progress Note   Patient Name: Patricia [name]       Date: 09/09/2023 DOB: 06-16-1940  Age: 83 y.o. MRN#: [id number] Attending Physician: Will Almarie MATSU, MD Primary Care Physician: Gayle Saddie JULIANNA DEVONNA Admit Date: 08/20/2023 Length of Stay: 18 days  Reason for Consultation/Follow-up: Establishing goals of care  Subjective:   Reviewed EMR including recent documentation from hospitalist.  Patient continues to receive comfort focused care while awaiting on long-term care placement with hospice layered in.  Patient resting in bed, appears weak, no acute symptoms, medication history noted. Awake alert, interactive with nursing staff, tolerating some PO.   Vital Signs:  BP 119/81 (BP Location: Right Arm)   Pulse 83   Temp 98.9 F (37.2 C) (Oral)   Resp 18   Ht 5' (1.524 m)   Wt 53.2 kg   SpO2 94%   BMI 22.91 kg/m   Physical Exam: General: Appears comfortable, NAD, unable to participate in complex medical decision-making, cachectic, frail Cardiovascular: RRR Respiratory: no increased work of breathing noted, not in respiratory distress Extremities: Muscle wasting present in all extremities  Assessment & Plan:   Assessment: Patient is an 83 year old female with a past medical history of metastatic breast cancer with disease to spine, A-fib on Eliquis , HFpEF, dementia, hypothyroidism, hyperlipidemia, pulmonary hypertension, CVA, pleural effusion, seizures, asthma, and GERD who was admitted on 08/20/2023 for management of generalized weakness and right hip pain. Oncology consulted for recommendations. Palliative medicine team consulted to assist with complex medical decision making.   Recommendations/Plan: # Complex medical decision making/goals of care:     -Patient unable to participate in complex medical decision-making due to underlying medical status.  Patient has underlying dementia.      -   Patient was already transitioned to full comfort focused care on 7/2.  Patient is  not a candidate for inpatient hospice.  Seeking long-term care placement with addition of [facility].  Awaiting disposition.  Palliative medicine team will be available as needed.              .  Focused on symptom management of pain, dyspnea, and agitation in the setting of end-of-life care.    Code Status: Do not attempt resuscitation (DNR) - Comfort care  # Symptom management Patient is receiving these palliative interventions for symptom management with an intent to improve quality of life.     -Pain/Dyspnea, acute in the setting of end-of-life care   - Continue tramadol  50 mg every 6 hours as needed - one to two doses used in the past 24 hours.    - Continue IV morphine  1-2 mg every 4 hours as needed breakthrough only after given oral tramadol     - Continue gabapentin  for 100 mg twice daily                 -Anxiety/agitation, in the setting of end-of-life care                               - Continue as needed Haldol , continue to adjust based on patient's symptom burden                  -Secretions, in the setting of end-of-life care                               - Continue as needed glycopyrrolate , continue to adjust  based on patient's symptom burden  # Psycho-social/Spiritual Support:  - Support System: Husband, son, daughter   # Discharge Planning: [facility] facility with [facility] hosp[facility] support. Discussed with TRH MD.   Thank you for allowing the palliative care team to participate in the care Ronal Primrose.  Palliative medicine team will continue to follow along peripherally.  Please reach out if acute PMT needs arise.  low MDM Lonia Serve MD.  Palliative Care Provider PMT # [phone]  If patient remains symptomatic despite maximum doses, please call PMT at [phone] between 0700 and 1900. Outside of these hours, please call attending, as PMT does not have night coverage.

## 2023-09-09 NOTE — Discharge Summary (Signed)
 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  Per spouse, patient has had poor appetite and weight loss over the last few weeks.  She was seen by her oncologist 2 weeks ago and was started on Marinol  and Megace  to help with appetite. Over the last few days, patient had been significantly weak and had difficulty getting in bed. UA at intake concerning for UTI. Hospitalist called for admission.   Patient continues to decline - taking PO poorly even with assistance, concern this is progression of disease complicated by advanced dementia - as we continue to focus on comfort , would recommend against lab draws, procedures, repeat imaging, aggressive therapy unless the purpose/goal is to improve overall comfort/quality of life.Care transitioned to full comfort care.  Patient is awaiting placement in a long-term facility. QuantiFERON gold test NEGATIVE.   Discharge Diagnoses:  Principal Problem:   Generalized weakness Active Problems:   Hyperlipidemia   Essential hypertension   History of CVA (cerebrovascular accident)   Anemia associated with chemotherapy   Thrombocytopenia (HCC)   Breast cancer metastasized to bone, unspecified laterality (HCC)   UTI (urinary tract infection)   Pancytopenia (HCC)   DNR (do not resuscitate)   Dementia (HCC)   Weakness generalized   Counseling and  coordination of care   Goals of care, counseling/discussion   Palliative care encounter   Metastatic malignant neoplasm [facility])   Concern about end of life   Pain   ACP (advance care planning)   Medication management    UTI, Acute cystitis: Completed antibiotic course. - Blood cultures negative so far, urine culture with multiple species.   Generalized weakness, FTT: - In the setting of advanced metastatic breast CA, stage IV . - Care transitioned to full comfort care.   Right hip pain in the setting of metastatic breast CA, metastasis: - Continue pain control. - Right hip x-ray shows sclerotic lesions, no obvious lytic lesion or bony destruction.  CT chest abdomen pelvis on 6/22 showed scattered sclerotic lesions in the bones slightly increased from 2024, no pathologic fractures.    Pancytopenia with mild anemia, thrombocytopenia - Hemoglobin 9.9 on admission and received 1 unit packed RBCs    Metastatic breast cancer, Osseous metastases - Continue gabapentin  as needed tramadol   - CT chest abdomen pelvis on 6/22 showed scattered sclerotic lesions in the bones slightly increased from 2024, no pathologic fractures, anasarca with moderate ascites, loculated moderate left pleural effusion - US  thoracentesis, likely malignant.  Currently no hypoxia or abdominal pain- Oncology, palliative medicine following, recommended DNR.   Left moderate pleural effusion, also chronic pleural effusion - Likely secondary to breast malignancy - CT chest abdomen pelvis showed moderate loculated left pleural effusion and small right pleural effusion, emphysema, moderate ascites and anasarca - US  guided left thoracentesis on 6/23, 720 cc removed.   Atrial fibrillation, rate controlled: Continue Eliquis  and digoxin    Chronic diastolic HF - Continue on digoxin  -Lopressor  stopped due to soft blood pressure  - Echo showed EF 60-65%, no  regional WMA, left ventricular diastolic function could not be  evaluated, RV SF, massive biatrial dilation suspect secondary to underlying A-fib but could consider workup for infiltrative cardiomyopathy, left atrial size severely dilated, moderate AR  Hypothyroidism - Continue Armour - TSH 5.9 Seizure disorder - Continue Keppra  GERD - Continue Protonix . Dementia - Continue Aricept  and memantine . Asthma - No wheezing, continue home bronchodilators.   Nutrition Problem: Increased nutrient needs Etiology: cancer and cancer related treatments    Signs/Symptoms: estimated needs     Interventions: Ensure Enlive (each supplement provides 350kcal and 20 grams of protein), MVI  Estimated body mass index is 22.91 kg/m as calculated from the following:   Height as of this encounter: 5' (1.524 m).   Weight as of this encounter: 53.2 kg.  Discharge Instructions  Discharge Instructions     Diet - low sodium heart healthy   Complete by: As directed    Increase activity slowly   Complete by: As directed       Allergies as of 09/09/2023       Reactions   Combigan [brimonidine Tartrate-timolol ] Itching   Per patient made eyes red, sore, and sensitivity to light   Sulfa Antibiotics Hives, Rash   Sulfamethoxazole-trimethoprim Hives        Medication List     STOP taking these medications    atorvastatin  40 MG tablet Commonly known as: LIPITOR   calcium -vitamin D  500-200 MG-UNIT tablet Commonly known as: OSCAL WITH D   Cerefolin 07-31-48-5 MG Tabs   dexamethasone  4 MG tablet Commonly known as: DECADRON    dronabinol  2.5 MG capsule Commonly known as: MARINOL    folic acid  400 MCG tablet Commonly known as: FOLVITE    gabapentin  400 MG capsule Commonly known as: NEURONTIN    lidocaine -prilocaine  cream Commonly known as: EMLA    megestrol  400 MG/10ML suspension Commonly known as: MEGACE    memantine  10 MG tablet Commonly known as: NAMENDA    metoprolol  tartrate 50 MG tablet Commonly known as: LOPRESSOR    Multivitamin/Iron  Tabs    OVER THE COUNTER MEDICATION   pantoprazole  40 MG tablet Commonly known as: PROTONIX    Prevagen 10 MG Caps Generic drug: Apoaequorin   prochlorperazine  10 MG tablet Commonly known as: COMPAZINE    Vitamin D3 50 MCG (2000 UT) capsule       TAKE these medications    acetaminophen  325 MG tablet Commonly known as: TYLENOL  Take 2 tablets (650 mg total) by mouth every 6 (six) hours as needed for mild pain (pain score 1-3) or fever (or Fever >/= 101).   albuterol  108 (90 Base) MCG/ACT inhaler Commonly known as: VENTOLIN  HFA Inhale 2 puffs into the lungs daily as needed for wheezing or shortness of breath.   apixaban  2.5 MG Tabs tablet Commonly known as: ELIQUIS  Take 1 tablet (2.5 mg total) by mouth 2 (two) times daily.   bimatoprost 0.01 % Soln Commonly known as: LUMIGAN Place 1 drop into both eyes 2 (two) times daily.   digoxin  0.125 MG tablet Commonly known as: LANOXIN  Take 1 tablet (0.125 mg total) by mouth daily.   donepezil  5 MG tablet Commonly known as: ARICEPT  Take 1 tablet (5 mg total) by mouth at bedtime.   dorzolamide -timolol  2-0.5 % ophthalmic solution Commonly known as: COSOPT  Place 1 drop into both eyes at bedtime.   fluticasone  50 MCG/ACT nasal spray Commonly known as: FLONASE  1 spray each nostril following sinus rinses twice daily   fluticasone -salmeterol 230-21 MCG/ACT inhaler Commonly known as: Advair  HFA USE 2 INHALATIONS TWICE A DAY  furosemide  20 MG tablet Commonly known as: LASIX  TAKE 1 TABLET TWICE A DAY. TAKE ADDITIONAL 1 TABLET AS NEEDED FOR SWELLING What changed: See the new instructions.   glycopyrrolate  1 MG tablet Commonly known as: ROBINUL  Take 1 tablet (1 mg total) by mouth every 4 (four) hours as needed (excessive secretions).   haloperidol  0.5 MG tablet Commonly known as: HALDOL  Take 1 tablet (0.5 mg total) by mouth every 4 (four) hours as needed for agitation (or delirium).   levETIRAcetam  500 MG 24 hr tablet Commonly  known as: KEPPRA  XR TAKE 1 TABLET AT BEDTIME   loratadine  10 MG tablet Commonly known as: CLARITIN  Take 10 mg by mouth daily.   ondansetron  4 MG tablet Commonly known as: ZOFRAN  Take 1 tablet (4 mg total) by mouth every 6 (six) hours as needed for nausea. What changed:  medication strength how much to take when to take this reasons to take this additional instructions   potassium chloride  SA 20 MEQ tablet Commonly known as: KLOR-CON  M Take 1 tablet (20 mEq total) by mouth daily.   Rhopressa 0.02 % Soln Generic drug: Netarsudil  Dimesylate Place 1 drop into both eyes at bedtime.   senna-docusate 8.6-50 MG tablet Commonly known as: Senokot-S Take 1 tablet by mouth at bedtime as needed for mild constipation.   thyroid  90 MG tablet Commonly known as: Armour Thyroid  Take 1 tablet (90 mg total) by mouth daily.   traMADol  50 MG tablet Commonly known as: ULTRAM  Take 1 tablet (50 mg total) by mouth every 6 (six) hours as needed for moderate pain (pain score 4-6) or severe pain (pain score 7-10). What changed: reasons to take this        Allergies  Allergen Reactions   Combigan [Brimonidine Tartrate-Timolol ] Itching    Per patient made eyes red, sore, and sensitivity to light   Sulfa Antibiotics Hives and Rash   Sulfamethoxazole-Trimethoprim Hives    Consultations:PALLIATIVE   Procedures/Studies: DG Chest 1 View Result Date: 08/23/2023 CLINICAL DATA:  Post thoracentesis on the left EXAM: CHEST  1 VIEW COMPARISON:  08/20/2023 FINDINGS: Decreasing left pleural effusion following thoracentesis. No pneumothorax. Small residual left pleural effusion. Heart is mildly enlarged. Aortic atherosclerosis. Vascular congestion. No confluent opacity or effusion on the right. Right Port-A-Cath is unchanged. IMPRESSION: Decreasing left pleural effusion following thoracentesis. No pneumothorax. Electronically Signed   By: Franky Crease M.D.   On: 08/23/2023 15:25   US  THORACENTESIS ASP  PLEURAL SPACE W/IMG GUIDE Result Date: 08/23/2023 INDICATION: Patient with history of metastatic breast cancer, recurrent left pleural effusion. Request received for diagnostic and therapeutic left thoracentesis. EXAM: ULTRASOUND GUIDED DIAGNOSTIC AND THERAPEUTIC LEFT THORACENTESIS MEDICATIONS: 8 mL 1% lidocaine  COMPLICATIONS: None immediate. PROCEDURE: An ultrasound guided thoracentesis was thoroughly discussed with the patient/spouse and questions answered. The benefits, risks, alternatives and complications were also discussed. The patient/spouse understands and wishes to proceed with the procedure. Written consent was obtained. Ultrasound was performed to localize and mark an adequate pocket of fluid in the left chest. The area was then prepped and draped in the normal sterile fashion. 1% Lidocaine  was used for local anesthesia. Under ultrasound guidance a 6 Fr Safe-T-Centesis catheter was introduced. Thoracentesis was performed. The catheter was removed and a dressing applied. FINDINGS: A total of approximately 720 cc of yellow fluid was removed. Samples were sent to the laboratory as requested by the clinical team. IMPRESSION: Successful ultrasound guided diagnostic and therapeutic left thoracentesis yielding 720 cc of pleural fluid. Performed by: Kevin Allred,PA-C Electronically  Signed   By: Marcey Moan M.D.   On: 08/23/2023 15:20   ECHOCARDIOGRAM COMPLETE Result Date: 08/23/2023    ECHOCARDIOGRAM REPORT   Patient Name:   Patricia [name] Date of Exam: 08/23/2023 Medical Rec #:  [id number]   Height:       60.0 in Accession #:    [id number]  Weight:       119.3 lb Date of Birth:  09-30-1940   BSA:          1.498 m Patient Age:    82 years    BP:           129/77 mmHg Patient Gender: F           HR:           112 bpm. Exam Location:  Inpatient Procedure: 2D Echo, Cardiac Doppler and Color Doppler (Both Spectral and Color            Flow Doppler were utilized during procedure). Indications:    Dyspnea  History:         Patient has prior history of Echocardiogram examinations, most                 recent 03/26/2023. Risk Factors:Hypertension.  Sonographer:    Benard Stallion Referring Phys: [id number] PETER R ENNEVER IMPRESSIONS  1. Left ventricular ejection fraction, by estimation, is 60 to 65%. The left ventricle has normal function. The left ventricle has no regional wall motion abnormalities. Left ventricular diastolic function could not be evaluated.  2. Right ventricular systolic function is normal. The right ventricular size is normal. There is normal pulmonary artery systolic pressure. The estimated right ventricular systolic pressure is 35.5 mmHg.  3. Massive biatrial dilation, suspect secondary to underlying afib but could consider workup for infiltrative cardiomyopathy. Left atrial size was severely dilated.  4. Right atrial size was severely dilated.  5. Moderate pleural effusion.  6. The mitral valve is normal in structure. Mild mitral valve regurgitation. No evidence of mitral stenosis.  7. The aortic valve is tricuspid. There is mild calcification of the aortic valve. Aortic valve regurgitation is moderate. Aortic valve sclerosis/calcification is present, without any evidence of aortic stenosis.  8. The inferior vena cava is normal in size with greater than 50% respiratory variability, suggesting right atrial pressure of 3 mmHg. FINDINGS  Left Ventricle: Left ventricular ejection fraction, by estimation, is 60 to 65%. The left ventricle has normal function. The left ventricle has no regional wall motion abnormalities. The left ventricular internal cavity size was normal in size. There is  no left ventricular hypertrophy. Left ventricular diastolic function could not be evaluated due to atrial fibrillation. Left ventricular diastolic function could not be evaluated. Right Ventricle: The right ventricular size is normal. No increase in right ventricular wall thickness. Right ventricular systolic function is normal.  There is normal pulmonary artery systolic pressure. The tricuspid regurgitant velocity is 2.85 m/s, and  with an assumed right atrial pressure of 3 mmHg, the estimated right ventricular systolic pressure is 35.5 mmHg. Left Atrium: Massive biatrial dilation, suspect secondary to underlying afib but could consider workup for infiltrative cardiomyopathy. Left atrial size was severely dilated. Right Atrium: Right atrial size was severely dilated. Pericardium: There is no evidence of pericardial effusion. Mitral Valve: The mitral valve is normal in structure. Mild mitral valve regurgitation. No evidence of mitral valve stenosis. Tricuspid Valve: The tricuspid valve is normal in structure. Tricuspid valve regurgitation is mild . No evidence of tricuspid stenosis.  Aortic Valve: The aortic valve is tricuspid. There is mild calcification of the aortic valve. Aortic valve regurgitation is moderate. Aortic regurgitation PHT measures 376 msec. Aortic valve sclerosis/calcification is present, without any evidence of aortic stenosis. Aortic valve mean gradient measures 5.0 mmHg. Aortic valve peak gradient measures 9.0 mmHg. Aortic valve area, by VTI measures 2.12 cm. Pulmonic Valve: The pulmonic valve was normal in structure. Pulmonic valve regurgitation is not visualized. No evidence of pulmonic stenosis. Aorta: The aortic root is normal in size and structure. Venous: The inferior vena cava is normal in size with greater than 50% respiratory variability, suggesting right atrial pressure of 3 mmHg. IAS/Shunts: There is right bowing of the interatrial septum, suggestive of elevated left atrial pressure. No atrial level shunt detected by color flow Doppler. Additional Comments: There is a moderate pleural effusion.  LEFT VENTRICLE PLAX 2D LVIDd:         3.70 cm   Diastology LVIDs:         2.40 cm   LV e' medial:    8.92 cm/s LV PW:         0.85 cm   LV E/e' medial:  14.3 LV IVS:        0.90 cm   LV e' lateral:   10.40 cm/s LVOT  diam:     2.20 cm   LV E/e' lateral: 12.3 LV SV:         46 LV SV Index:   31 LVOT Area:     3.80 cm  RIGHT VENTRICLE RV Basal diam:  2.70 cm RV Mid diam:    2.10 cm RV S prime:     11.20 cm/s TAPSE (M-mode): 2.0 cm LEFT ATRIUM              Index        RIGHT ATRIUM           Index LA diam:        5.20 cm  3.47 cm/m   RA Area:     25.40 cm LA Vol (A2C):   146.0 ml 97.43 ml/m  RA Volume:   80.20 ml  53.52 ml/m LA Vol (A4C):   141.0 ml 94.10 ml/m LA Biplane Vol: 145.0 ml 96.77 ml/m  AORTIC VALVE AV Area (Vmax):    2.00 cm AV Area (Vmean):   1.82 cm AV Area (VTI):     2.12 cm AV Vmax:           150.00 cm/s AV Vmean:          102.000 cm/s AV VTI:            0.217 m AV Peak Grad:      9.0 mmHg AV Mean Grad:      5.0 mmHg LVOT Vmax:         78.80 cm/s LVOT Vmean:        48.800 cm/s LVOT VTI:          0.121 m LVOT/AV VTI ratio: 0.56 AI PHT:            376 msec  AORTA Ao Root diam: 3.30 cm Ao Asc diam:  3.50 cm MITRAL VALVE                TRICUSPID VALVE MV Area (PHT): 4.63 cm     TR Peak grad:   32.5 mmHg MV Decel Time: 164 msec     TR Vmax:        285.00 cm/s MR  Peak grad: 90.2 mmHg MR Vmax:      475.00 cm/s   SHUNTS MV E velocity: 128.00 cm/s  Systemic VTI:  0.12 m MV A velocity: 50.50 cm/s   Systemic Diam: 2.20 cm MV E/A ratio:  2.53 Morene Brownie Electronically signed by Morene Brownie Signature Date/Time: 08/23/2023/10:31:27 AM    Final    CT CHEST ABDOMEN PELVIS W CONTRAST Result Date: 08/22/2023 CLINICAL DATA:  Invasive breast cancer, stage IV. Assess treatment response. EXAM: CT CHEST, ABDOMEN, AND PELVIS WITH CONTRAST TECHNIQUE: Multidetector CT imaging of the chest, abdomen and pelvis was performed following the standard protocol during bolus administration of intravenous contrast. RADIATION DOSE REDUCTION: This exam was performed according to the departmental dose-optimization program which includes automated exposure control, adjustment of the mA and/or kV according to patient size and/or use of  iterative reconstruction technique. CONTRAST:  OMNIPAQUE  IOHEXOL  300 MG/ML  SOLN COMPARISON:  05/04/2023, 08/13/2022. FINDINGS: CT CHEST FINDINGS Cardiovascular: The heart is enlarged and there is a small pericardial effusion. The distal tip of a chest port terminates in the superior vena cava. There is atherosclerotic calcification of the aorta without evidence of aneurysm. The pulmonary trunk is normal in caliber. Mediastinum/Nodes: No mediastinal, hilar, or axillary lymphadenopathy is seen. Surgical clips are noted in the axilla on the left. The trachea and esophagus are within normal limits. There is a small hiatal hernia. Lungs/Pleura: There is a moderate loculated pleural effusion on the left. A small pleural effusion is noted on the right. Centrilobular emphysematous changes are present in the lungs. Atelectasis is noted bilaterally. No pneumothorax. Treatment related changes are noted in the left lung. No discrete pulmonary nodule or mass is seen. Musculoskeletal: Degenerative changes are present in the thoracic spine. A few scattered sclerotic lesions are noted in the thoracic spine and ribs, slightly increased from 2024. No pathologic fracture is seen. CT ABDOMEN PELVIS FINDINGS Hepatobiliary: No focal liver abnormality is seen. No gallstones, gallbladder wall thickening, or biliary dilatation. Pancreas: Unremarkable. No pancreatic ductal dilatation or surrounding inflammatory changes. Spleen: Normal in size without focal abnormality. 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 The right adrenal gland is within normal limits. The kidneys enhance symmetrically. No renal calculus or hydronephrosis bilaterally. The bladder is unremarkable. Stomach/Bowel: There is a small hiatal hernia. The stomach is otherwise within normal limits. No bowel obstruction, free air, or pneumatosis is seen. Scattered diverticular present along the colon without evidence of  diverticulitis. Appendix appears normal. Vascular/Lymphatic: Aortic atherosclerosis. No abdominal or pelvic lymphadenopathy is seen by size criteria. Reproductive: Uterus and bilateral adnexa are unremarkable. Other: Moderate ascites is noted in all 4 quadrants. There is mild anasarca. Musculoskeletal: Fixation hardware is noted in the proximal left femur. Scattered sclerotic lesions are seen in the bones, slightly increased from 2024. There degenerative changes in the lumbar spine. There is a compression deformity at the L5 vertebral body, unchanged from 2024. IMPRESSION: 1. Scattered sclerotic lesions in the in the bones, slightly increased from 2024. No new pathologic fracture is seen. 2. Moderate loculated left pleural effusion and small right pleural effusion with scattered atelectasis. 3. Emphysema. 4. Moderate ascites and anasarca. 5. Diverticulosis without diverticulitis. 6. Aortic atherosclerosis. Electronically Signed   By: Leita Birmingham M.D.   On: 08/22/2023 15:35   DG HIP UNILAT WITH PELVIS 2-3 VIEWS RIGHT Result Date: 08/22/2023 CLINICAL DATA:  Right hip pain. History of metastatic breast cancer. EXAM: DG HIP (WITH OR WITHOUT PELVIS) 2-3V  RIGHT COMPARISON:  PET CT 05/04/2023 reviewed FINDINGS: Sclerotic lesions in the pelvis on prior PET are not well demonstrated by radiograph. There is no obvious lytic lesion or bony destruction. No evidence of acute fracture. The hip joint spaces preserved. Pubic rami are intact. Right pelvic phleboliths. Left femoral intramedullary nail with trans trochanteric screw fixation is partially included in the field of view. IMPRESSION: Sclerotic lesions in the pelvis on prior PET are not well demonstrated by radiograph. No obvious lytic lesion or bony destruction. If there is clinical concern for metastatic disease, recommend MRI for further assessment. Electronically Signed   By: Andrea Gasman M.D.   On: 08/22/2023 13:57   CT Head Wo Contrast Result Date:  08/20/2023 CLINICAL DATA:  Mental status change, unknown cause EXAM: CT HEAD WITHOUT CONTRAST TECHNIQUE: Contiguous axial images were obtained from the base of the skull through the vertex without intravenous contrast. RADIATION DOSE REDUCTION: This exam was performed according to the departmental dose-optimization program which includes automated exposure control, adjustment of the mA and/or kV according to patient size and/or use of iterative reconstruction technique. COMPARISON:  04/29/2015 FINDINGS: Brain: Normal anatomic configuration. Progressive parenchymal volume loss is present, with global parenchymal volume commensurate with the patient's age. Mild periventricular white matter changes are present likely reflecting the sequela of small vessel ischemia. No abnormal intra or extra-axial mass lesion or fluid collection. No abnormal mass effect or midline shift. No evidence of acute intracranial hemorrhage or infarct. Ventricular size is normal. Cerebellum unremarkable. Vascular: No asymmetric hyperdense vasculature at the skull base. Skull: No acute fracture. Interval development of multiple sclerotic foci within the calvarium compatible with sclerotic metastatic disease. Sinuses/Orbits: Paranasal sinuses are clear. Ocular lenses have been removed. Orbits are otherwise unremarkable. Other: Mastoid air cells and middle ear cavities are clear. IMPRESSION: 1. No evidence of acute intracranial abnormality. 2. Progressive global parenchymal volume loss. 3. Interval development of multiple sclerotic foci within the calvarium compatible with sclerotic metastatic disease. Electronically Signed   By: Dorethia Molt M.D.   On: 08/20/2023 23:48   DG Chest Port 1 View Result Date: 08/20/2023 CLINICAL DATA:  Generalized weakness, breast cancer EXAM: PORTABLE CHEST 1 VIEW COMPARISON:  05/10/2023 FINDINGS: Moderate left pleural effusion has developed with compressive atelectasis of the left lung base. Right lung is  clear. No pneumothorax. No pleural effusion on the right. Right internal jugular chest port tip seen within the superior vena cava. Cardiac size is stable. Pulmonary vascularity is normal. Surgical clips are seen within the left axilla. IMPRESSION: 1. Interval development of moderate left pleural effusion. Electronically Signed   By: Dorethia Molt M.D.   On: 08/20/2023 23:30   (Echo, Carotid, EGD, Colonoscopy, ERCP)    Subjective: No overnight events  Discharge Exam: Vitals:   09/08/23 0611 09/09/23 0548  BP: (!) 145/83 119/81  Pulse: 98 99  Resp: 16 18  Temp: 97.8 F (36.6 C) 98.9 F (37.2 C)  SpO2: 95% 94%   Vitals:   09/07/23 0432 09/07/23 0905 09/08/23 0611 09/09/23 0548  BP: 98/64  (!) 145/83 119/81  Pulse: 92 80 98 99  Resp: 17  16 18   Temp: 98.4 F (36.9 C)  97.8 F (36.6 C) 98.9 F (37.2 C)  TempSrc: Oral  Oral Oral  SpO2: 94%  95% 94%  Weight:      Height:        General: Pt is alert, awake, not in acute distress Cardiovascular: RRR, S1/S2 +, no rubs, no gallops Respiratory: CTA  bilaterally, no wheezing, no rhonchi Abdominal: Soft, NT, ND, bowel sounds + Extremities: no edema, no cyanosis    The results of significant diagnostics from this hospitalization (including imaging, microbiology, ancillary and laboratory) are listed below for reference.     Microbiology: No results found for this or any previous visit (from the past 240 hours).   Labs: BNP (last 3 results) No results for input(s): BNP in the last 8760 hours. Basic Metabolic Panel: Recent Labs  Lab 09/05/23 0547  NA 135  K 3.4*  CL 101  CO2 24  GLUCOSE 93  BUN 16  CREATININE 0.60  CALCIUM  7.6*   Liver Function Tests: Recent Labs  Lab 09/05/23 0547  AST 57*  ALT 48*  ALKPHOS 75  BILITOT 1.3*  PROT 5.5*  ALBUMIN  2.7*   No results for input(s): LIPASE, AMYLASE in the last 168 hours. No results for input(s): AMMONIA in the last 168 hours. CBC: Recent Labs  Lab  09/05/23 0547  WBC 4.8  NEUTROABS 3.4  HGB 11.8*  HCT 33.7*  MCV 98.8  PLT 112*   Cardiac Enzymes: No results for input(s): CKTOTAL, CKMB, CKMBINDEX, TROPONINI in the last 168 hours. BNP: Invalid input(s): POCBNP CBG: No results for input(s): GLUCAP in the last 168 hours. D-Dimer No results for input(s): DDIMER in the last 72 hours. Hgb A1c No results for input(s): HGBA1C in the last 72 hours. Lipid Profile No results for input(s): CHOL, HDL, LDLCALC, TRIG, CHOLHDL, LDLDIRECT in the last 72 hours. Thyroid  function studies No results for input(s): TSH, T4TOTAL, T3FREE, THYROIDAB in the last 72 hours.  Invalid input(s): FREET3 Anemia work up No results for input(s): VITAMINB12, FOLATE, FERRITIN, TIBC, IRON , RETICCTPCT in the last 72 hours. Urinalysis    Component Value Date/Time   COLORURINE YELLOW 08/21/2023 0450   APPEARANCEUR HAZY (A) 08/21/2023 0450   LABSPEC 1.020 08/21/2023 0450   PHURINE 6.0 08/21/2023 0450   GLUCOSEU NEGATIVE 08/21/2023 0450   HGBUR NEGATIVE 08/21/2023 0450   HGBUR negative 05/20/2007 0942   BILIRUBINUR NEGATIVE 08/21/2023 0450   BILIRUBINUR negative 12/05/2018 1540   KETONESUR NEGATIVE 08/21/2023 0450   PROTEINUR 30 (A) 08/21/2023 0450   UROBILINOGEN 1.0 12/05/2018 1540   UROBILINOGEN 0.2 05/20/2007 0942   NITRITE NEGATIVE 08/21/2023 0450   LEUKOCYTESUR LARGE (A) 08/21/2023 0450   Sepsis Labs Recent Labs  Lab 09/05/23 0547  WBC 4.8   Microbiology No results found for this or any previous visit (from the past 240 hours).   Time coordinating discharge: 35 minutes SIGNED:   Almarie KANDICE Hoots, MD  [facility] 09/09/2023, 9:05 AM

## 2023-09-09 NOTE — Progress Notes (Signed)
 This nurse called to [facility] in regards to patient being transferred. Report given to Asberry Rosella (RN).

## 2023-09-09 NOTE — TOC Transition Note (Addendum)
 Transition of Care [facility]) - Discharge Note   Patient Details  Name: Patricia [name] MRN: [id number] Date of Birth: 03-27-1940  Transition of Care [facility]) CM/SW Contact:  Patricia LITTIE Agar, RN Phone Number:[phone]  09/09/2023, 11:38 AM   Clinical Narrative:    Patient discharging to [facility] with [facility] hospice following. Awaiting DME delivery before setting up transport.   CM has confirmed with Hospice that DME has been delivered. Transportation has been arranged per PTAR.   Nursing please call report to [facility] [phone] Room # 322   Final next level of care:  (ALF with hospice) Barriers to Discharge: No Barriers Identified   Patient Goals and CMS Choice Patient states their goals for this hospitalization and ongoing recovery are:: Patient uanable to verbalize   Choice offered to / list presented to : Spouse (for Hospice Agency) Patricia [name] ownership interest in [facility].provided to:: Spouse    Discharge Placement                Patient to be transferred to facility by: PTAR Name of family member notified: Patricia [name]  [phone] Patient and family notified of of transfer: 09/09/23  Discharge Plan and Services Additional resources added to the After Visit Summary for   In-house Referral: NA Discharge Planning Services: CM Consult Post Acute Care Choice: NA          DME Arranged: N/A DME Agency: Other - Comment (Arranged per [facility])       HH Arranged: NA HH Agency:  [facility] [facility])        Social Drivers of Health (SDOH) Interventions SDOH Screenings   Food Insecurity: No Food Insecurity (08/21/2023)  Housing: Low Risk  (08/21/2023)  Transportation Needs: No Transportation Needs (08/21/2023)  Utilities: Not At Risk (08/21/2023)  Alcohol  Screen: Low Risk  (07/30/2023)  Depression (PHQ2-9): Medium Risk (08/03/2023)  Financial Resource Strain: Low Risk  (07/30/2023)  Physical Activity: Inactive (07/30/2023)  Social Connections:  Moderately Integrated (08/21/2023)  Stress: No Stress Concern Present (07/30/2023)  Tobacco Use: Medium Risk (08/20/2023)  Health Literacy: Adequate Health Literacy (10/29/2022)     Readmission Risk Interventions    08/25/2023    2:47 PM  Readmission Risk Prevention Plan  Transportation Screening Complete  PCP or Specialist Appt within 3-5 Days Complete  HRI or Home Care Consult Complete  Social Work Consult for Recovery Care Planning/Counseling Complete  Palliative Care Screening Complete  Medication Review [employer]) Complete

## 2023-09-09 NOTE — Plan of Care (Signed)
  Problem: Safety: Goal: Ability to remain free from injury will improve Outcome: Progressing   Problem: Pain Management: Goal: Satisfaction with pain management regimen will improve Outcome: Progressing

## 2023-09-12 DIAGNOSIS — I4891 Unspecified atrial fibrillation: Secondary | ICD-10-CM | POA: Diagnosis not present

## 2023-09-12 DIAGNOSIS — H44519 Absolute glaucoma, unspecified eye: Secondary | ICD-10-CM | POA: Diagnosis not present

## 2023-09-12 DIAGNOSIS — I10 Essential (primary) hypertension: Secondary | ICD-10-CM | POA: Diagnosis not present

## 2023-09-12 DIAGNOSIS — E039 Hypothyroidism, unspecified: Secondary | ICD-10-CM | POA: Diagnosis not present

## 2023-09-12 DIAGNOSIS — I5032 Chronic diastolic (congestive) heart failure: Secondary | ICD-10-CM | POA: Diagnosis not present

## 2023-09-12 DIAGNOSIS — C7951 Secondary malignant neoplasm of bone: Secondary | ICD-10-CM | POA: Diagnosis not present

## 2023-09-12 DIAGNOSIS — E785 Hyperlipidemia, unspecified: Secondary | ICD-10-CM | POA: Diagnosis not present

## 2023-09-12 DIAGNOSIS — J4530 Mild persistent asthma, uncomplicated: Secondary | ICD-10-CM | POA: Diagnosis not present

## 2023-09-12 DIAGNOSIS — I679 Cerebrovascular disease, unspecified: Secondary | ICD-10-CM | POA: Diagnosis not present

## 2023-09-12 DIAGNOSIS — K219 Gastro-esophageal reflux disease without esophagitis: Secondary | ICD-10-CM | POA: Diagnosis not present

## 2023-09-12 DIAGNOSIS — G4089 Other seizures: Secondary | ICD-10-CM | POA: Diagnosis not present

## 2023-09-12 DIAGNOSIS — C50919 Malignant neoplasm of unspecified site of unspecified female breast: Secondary | ICD-10-CM | POA: Diagnosis not present

## 2023-09-13 ENCOUNTER — Telehealth: Payer: Self-pay | Admitting: Adult Health

## 2023-09-13 DIAGNOSIS — I4891 Unspecified atrial fibrillation: Secondary | ICD-10-CM | POA: Diagnosis not present

## 2023-09-13 DIAGNOSIS — I10 Essential (primary) hypertension: Secondary | ICD-10-CM | POA: Diagnosis not present

## 2023-09-13 DIAGNOSIS — C50919 Malignant neoplasm of unspecified site of unspecified female breast: Secondary | ICD-10-CM | POA: Diagnosis not present

## 2023-09-13 DIAGNOSIS — K219 Gastro-esophageal reflux disease without esophagitis: Secondary | ICD-10-CM | POA: Diagnosis not present

## 2023-09-13 DIAGNOSIS — E785 Hyperlipidemia, unspecified: Secondary | ICD-10-CM | POA: Diagnosis not present

## 2023-09-13 DIAGNOSIS — C7951 Secondary malignant neoplasm of bone: Secondary | ICD-10-CM | POA: Diagnosis not present

## 2023-09-13 NOTE — Telephone Encounter (Signed)
 Spouse states not needed, pt under hospice care

## 2023-09-14 DIAGNOSIS — F0390 Unspecified dementia without behavioral disturbance: Secondary | ICD-10-CM | POA: Diagnosis not present

## 2023-09-14 DIAGNOSIS — E039 Hypothyroidism, unspecified: Secondary | ICD-10-CM | POA: Diagnosis not present

## 2023-09-14 DIAGNOSIS — I10 Essential (primary) hypertension: Secondary | ICD-10-CM | POA: Diagnosis not present

## 2023-09-14 DIAGNOSIS — I4891 Unspecified atrial fibrillation: Secondary | ICD-10-CM | POA: Diagnosis not present

## 2023-09-14 DIAGNOSIS — R52 Pain, unspecified: Secondary | ICD-10-CM | POA: Diagnosis not present

## 2023-09-14 DIAGNOSIS — K219 Gastro-esophageal reflux disease without esophagitis: Secondary | ICD-10-CM | POA: Diagnosis not present

## 2023-09-14 DIAGNOSIS — E785 Hyperlipidemia, unspecified: Secondary | ICD-10-CM | POA: Diagnosis not present

## 2023-09-14 DIAGNOSIS — I5022 Chronic systolic (congestive) heart failure: Secondary | ICD-10-CM | POA: Diagnosis not present

## 2023-09-14 DIAGNOSIS — C50919 Malignant neoplasm of unspecified site of unspecified female breast: Secondary | ICD-10-CM | POA: Diagnosis not present

## 2023-09-14 DIAGNOSIS — C7951 Secondary malignant neoplasm of bone: Secondary | ICD-10-CM | POA: Diagnosis not present

## 2023-09-16 ENCOUNTER — Ambulatory Visit: Payer: Medicare Other | Admitting: Adult Health

## 2023-09-16 ENCOUNTER — Encounter: Payer: Self-pay | Admitting: Hematology & Oncology

## 2023-09-16 DIAGNOSIS — C50919 Malignant neoplasm of unspecified site of unspecified female breast: Secondary | ICD-10-CM | POA: Diagnosis not present

## 2023-09-16 DIAGNOSIS — E785 Hyperlipidemia, unspecified: Secondary | ICD-10-CM | POA: Diagnosis not present

## 2023-09-16 DIAGNOSIS — K219 Gastro-esophageal reflux disease without esophagitis: Secondary | ICD-10-CM | POA: Diagnosis not present

## 2023-09-16 DIAGNOSIS — I4891 Unspecified atrial fibrillation: Secondary | ICD-10-CM | POA: Diagnosis not present

## 2023-09-16 DIAGNOSIS — I10 Essential (primary) hypertension: Secondary | ICD-10-CM | POA: Diagnosis not present

## 2023-09-16 DIAGNOSIS — C7951 Secondary malignant neoplasm of bone: Secondary | ICD-10-CM | POA: Diagnosis not present

## 2023-09-17 DIAGNOSIS — C7951 Secondary malignant neoplasm of bone: Secondary | ICD-10-CM | POA: Diagnosis not present

## 2023-09-17 DIAGNOSIS — I10 Essential (primary) hypertension: Secondary | ICD-10-CM | POA: Diagnosis not present

## 2023-09-17 DIAGNOSIS — K219 Gastro-esophageal reflux disease without esophagitis: Secondary | ICD-10-CM | POA: Diagnosis not present

## 2023-09-17 DIAGNOSIS — C50919 Malignant neoplasm of unspecified site of unspecified female breast: Secondary | ICD-10-CM | POA: Diagnosis not present

## 2023-09-17 DIAGNOSIS — E785 Hyperlipidemia, unspecified: Secondary | ICD-10-CM | POA: Diagnosis not present

## 2023-09-17 DIAGNOSIS — I4891 Unspecified atrial fibrillation: Secondary | ICD-10-CM | POA: Diagnosis not present

## 2023-09-19 DIAGNOSIS — K219 Gastro-esophageal reflux disease without esophagitis: Secondary | ICD-10-CM | POA: Diagnosis not present

## 2023-09-19 DIAGNOSIS — C50919 Malignant neoplasm of unspecified site of unspecified female breast: Secondary | ICD-10-CM | POA: Diagnosis not present

## 2023-09-19 DIAGNOSIS — C7951 Secondary malignant neoplasm of bone: Secondary | ICD-10-CM | POA: Diagnosis not present

## 2023-09-19 DIAGNOSIS — E785 Hyperlipidemia, unspecified: Secondary | ICD-10-CM | POA: Diagnosis not present

## 2023-09-19 DIAGNOSIS — I10 Essential (primary) hypertension: Secondary | ICD-10-CM | POA: Diagnosis not present

## 2023-09-19 DIAGNOSIS — I4891 Unspecified atrial fibrillation: Secondary | ICD-10-CM | POA: Diagnosis not present

## 2023-09-20 ENCOUNTER — Ambulatory Visit: Admitting: Hematology & Oncology

## 2023-09-20 ENCOUNTER — Other Ambulatory Visit

## 2023-09-20 ENCOUNTER — Ambulatory Visit

## 2023-09-20 DIAGNOSIS — C7951 Secondary malignant neoplasm of bone: Secondary | ICD-10-CM | POA: Diagnosis not present

## 2023-09-20 DIAGNOSIS — E785 Hyperlipidemia, unspecified: Secondary | ICD-10-CM | POA: Diagnosis not present

## 2023-09-20 DIAGNOSIS — I10 Essential (primary) hypertension: Secondary | ICD-10-CM | POA: Diagnosis not present

## 2023-09-20 DIAGNOSIS — I4891 Unspecified atrial fibrillation: Secondary | ICD-10-CM | POA: Diagnosis not present

## 2023-09-20 DIAGNOSIS — C50919 Malignant neoplasm of unspecified site of unspecified female breast: Secondary | ICD-10-CM | POA: Diagnosis not present

## 2023-09-20 DIAGNOSIS — K219 Gastro-esophageal reflux disease without esophagitis: Secondary | ICD-10-CM | POA: Diagnosis not present

## 2023-09-22 DIAGNOSIS — C7951 Secondary malignant neoplasm of bone: Secondary | ICD-10-CM | POA: Diagnosis not present

## 2023-09-22 DIAGNOSIS — K219 Gastro-esophageal reflux disease without esophagitis: Secondary | ICD-10-CM | POA: Diagnosis not present

## 2023-09-22 DIAGNOSIS — C50919 Malignant neoplasm of unspecified site of unspecified female breast: Secondary | ICD-10-CM | POA: Diagnosis not present

## 2023-09-22 DIAGNOSIS — E785 Hyperlipidemia, unspecified: Secondary | ICD-10-CM | POA: Diagnosis not present

## 2023-09-22 DIAGNOSIS — I10 Essential (primary) hypertension: Secondary | ICD-10-CM | POA: Diagnosis not present

## 2023-09-22 DIAGNOSIS — I4891 Unspecified atrial fibrillation: Secondary | ICD-10-CM | POA: Diagnosis not present

## 2023-09-23 DIAGNOSIS — C50919 Malignant neoplasm of unspecified site of unspecified female breast: Secondary | ICD-10-CM | POA: Diagnosis not present

## 2023-09-23 DIAGNOSIS — I4891 Unspecified atrial fibrillation: Secondary | ICD-10-CM | POA: Diagnosis not present

## 2023-09-23 DIAGNOSIS — C7951 Secondary malignant neoplasm of bone: Secondary | ICD-10-CM | POA: Diagnosis not present

## 2023-09-23 DIAGNOSIS — E785 Hyperlipidemia, unspecified: Secondary | ICD-10-CM | POA: Diagnosis not present

## 2023-09-23 DIAGNOSIS — I10 Essential (primary) hypertension: Secondary | ICD-10-CM | POA: Diagnosis not present

## 2023-09-23 DIAGNOSIS — K219 Gastro-esophageal reflux disease without esophagitis: Secondary | ICD-10-CM | POA: Diagnosis not present

## 2023-09-24 DIAGNOSIS — I10 Essential (primary) hypertension: Secondary | ICD-10-CM | POA: Diagnosis not present

## 2023-09-24 DIAGNOSIS — C7951 Secondary malignant neoplasm of bone: Secondary | ICD-10-CM | POA: Diagnosis not present

## 2023-09-24 DIAGNOSIS — C50919 Malignant neoplasm of unspecified site of unspecified female breast: Secondary | ICD-10-CM | POA: Diagnosis not present

## 2023-09-24 DIAGNOSIS — K219 Gastro-esophageal reflux disease without esophagitis: Secondary | ICD-10-CM | POA: Diagnosis not present

## 2023-09-24 DIAGNOSIS — I4891 Unspecified atrial fibrillation: Secondary | ICD-10-CM | POA: Diagnosis not present

## 2023-09-24 DIAGNOSIS — E785 Hyperlipidemia, unspecified: Secondary | ICD-10-CM | POA: Diagnosis not present

## 2023-09-26 DIAGNOSIS — I4891 Unspecified atrial fibrillation: Secondary | ICD-10-CM | POA: Diagnosis not present

## 2023-09-26 DIAGNOSIS — C7951 Secondary malignant neoplasm of bone: Secondary | ICD-10-CM | POA: Diagnosis not present

## 2023-09-26 DIAGNOSIS — K219 Gastro-esophageal reflux disease without esophagitis: Secondary | ICD-10-CM | POA: Diagnosis not present

## 2023-09-26 DIAGNOSIS — E785 Hyperlipidemia, unspecified: Secondary | ICD-10-CM | POA: Diagnosis not present

## 2023-09-26 DIAGNOSIS — I10 Essential (primary) hypertension: Secondary | ICD-10-CM | POA: Diagnosis not present

## 2023-09-26 DIAGNOSIS — C50919 Malignant neoplasm of unspecified site of unspecified female breast: Secondary | ICD-10-CM | POA: Diagnosis not present

## 2023-09-28 DIAGNOSIS — E785 Hyperlipidemia, unspecified: Secondary | ICD-10-CM | POA: Diagnosis not present

## 2023-09-28 DIAGNOSIS — C7951 Secondary malignant neoplasm of bone: Secondary | ICD-10-CM | POA: Diagnosis not present

## 2023-09-28 DIAGNOSIS — C50919 Malignant neoplasm of unspecified site of unspecified female breast: Secondary | ICD-10-CM | POA: Diagnosis not present

## 2023-09-28 DIAGNOSIS — I4891 Unspecified atrial fibrillation: Secondary | ICD-10-CM | POA: Diagnosis not present

## 2023-09-28 DIAGNOSIS — K219 Gastro-esophageal reflux disease without esophagitis: Secondary | ICD-10-CM | POA: Diagnosis not present

## 2023-09-28 DIAGNOSIS — I10 Essential (primary) hypertension: Secondary | ICD-10-CM | POA: Diagnosis not present

## 2023-09-28 DIAGNOSIS — I959 Hypotension, unspecified: Secondary | ICD-10-CM | POA: Diagnosis not present

## 2023-09-29 ENCOUNTER — Ambulatory Visit: Admitting: Emergency Medicine

## 2023-09-29 DIAGNOSIS — I10 Essential (primary) hypertension: Secondary | ICD-10-CM | POA: Diagnosis not present

## 2023-09-29 DIAGNOSIS — I4891 Unspecified atrial fibrillation: Secondary | ICD-10-CM | POA: Diagnosis not present

## 2023-09-29 DIAGNOSIS — C50919 Malignant neoplasm of unspecified site of unspecified female breast: Secondary | ICD-10-CM | POA: Diagnosis not present

## 2023-09-29 DIAGNOSIS — E785 Hyperlipidemia, unspecified: Secondary | ICD-10-CM | POA: Diagnosis not present

## 2023-09-29 DIAGNOSIS — C7951 Secondary malignant neoplasm of bone: Secondary | ICD-10-CM | POA: Diagnosis not present

## 2023-09-29 DIAGNOSIS — K219 Gastro-esophageal reflux disease without esophagitis: Secondary | ICD-10-CM | POA: Diagnosis not present

## 2023-10-01 DIAGNOSIS — I5032 Chronic diastolic (congestive) heart failure: Secondary | ICD-10-CM | POA: Diagnosis not present

## 2023-10-01 DIAGNOSIS — K219 Gastro-esophageal reflux disease without esophagitis: Secondary | ICD-10-CM | POA: Diagnosis not present

## 2023-10-01 DIAGNOSIS — C7951 Secondary malignant neoplasm of bone: Secondary | ICD-10-CM | POA: Diagnosis not present

## 2023-10-01 DIAGNOSIS — E039 Hypothyroidism, unspecified: Secondary | ICD-10-CM | POA: Diagnosis not present

## 2023-10-01 DIAGNOSIS — I4891 Unspecified atrial fibrillation: Secondary | ICD-10-CM | POA: Diagnosis not present

## 2023-10-01 DIAGNOSIS — G4089 Other seizures: Secondary | ICD-10-CM | POA: Diagnosis not present

## 2023-10-01 DIAGNOSIS — J4530 Mild persistent asthma, uncomplicated: Secondary | ICD-10-CM | POA: Diagnosis not present

## 2023-10-01 DIAGNOSIS — E785 Hyperlipidemia, unspecified: Secondary | ICD-10-CM | POA: Diagnosis not present

## 2023-10-01 DIAGNOSIS — C50919 Malignant neoplasm of unspecified site of unspecified female breast: Secondary | ICD-10-CM | POA: Diagnosis not present

## 2023-10-01 DIAGNOSIS — I679 Cerebrovascular disease, unspecified: Secondary | ICD-10-CM | POA: Diagnosis not present

## 2023-10-01 DIAGNOSIS — H44519 Absolute glaucoma, unspecified eye: Secondary | ICD-10-CM | POA: Diagnosis not present

## 2023-10-01 DIAGNOSIS — I10 Essential (primary) hypertension: Secondary | ICD-10-CM | POA: Diagnosis not present

## 2023-10-03 DIAGNOSIS — E785 Hyperlipidemia, unspecified: Secondary | ICD-10-CM | POA: Diagnosis not present

## 2023-10-03 DIAGNOSIS — C50919 Malignant neoplasm of unspecified site of unspecified female breast: Secondary | ICD-10-CM | POA: Diagnosis not present

## 2023-10-03 DIAGNOSIS — C7951 Secondary malignant neoplasm of bone: Secondary | ICD-10-CM | POA: Diagnosis not present

## 2023-10-03 DIAGNOSIS — I10 Essential (primary) hypertension: Secondary | ICD-10-CM | POA: Diagnosis not present

## 2023-10-03 DIAGNOSIS — I4891 Unspecified atrial fibrillation: Secondary | ICD-10-CM | POA: Diagnosis not present

## 2023-10-03 DIAGNOSIS — K219 Gastro-esophageal reflux disease without esophagitis: Secondary | ICD-10-CM | POA: Diagnosis not present

## 2023-10-05 DIAGNOSIS — C7951 Secondary malignant neoplasm of bone: Secondary | ICD-10-CM | POA: Diagnosis not present

## 2023-10-05 DIAGNOSIS — K219 Gastro-esophageal reflux disease without esophagitis: Secondary | ICD-10-CM | POA: Diagnosis not present

## 2023-10-05 DIAGNOSIS — I4891 Unspecified atrial fibrillation: Secondary | ICD-10-CM | POA: Diagnosis not present

## 2023-10-05 DIAGNOSIS — C50919 Malignant neoplasm of unspecified site of unspecified female breast: Secondary | ICD-10-CM | POA: Diagnosis not present

## 2023-10-05 DIAGNOSIS — I10 Essential (primary) hypertension: Secondary | ICD-10-CM | POA: Diagnosis not present

## 2023-10-05 DIAGNOSIS — E785 Hyperlipidemia, unspecified: Secondary | ICD-10-CM | POA: Diagnosis not present

## 2023-10-06 DIAGNOSIS — I10 Essential (primary) hypertension: Secondary | ICD-10-CM | POA: Diagnosis not present

## 2023-10-06 DIAGNOSIS — E785 Hyperlipidemia, unspecified: Secondary | ICD-10-CM | POA: Diagnosis not present

## 2023-10-06 DIAGNOSIS — I4891 Unspecified atrial fibrillation: Secondary | ICD-10-CM | POA: Diagnosis not present

## 2023-10-06 DIAGNOSIS — C50919 Malignant neoplasm of unspecified site of unspecified female breast: Secondary | ICD-10-CM | POA: Diagnosis not present

## 2023-10-06 DIAGNOSIS — K219 Gastro-esophageal reflux disease without esophagitis: Secondary | ICD-10-CM | POA: Diagnosis not present

## 2023-10-06 DIAGNOSIS — C7951 Secondary malignant neoplasm of bone: Secondary | ICD-10-CM | POA: Diagnosis not present

## 2023-10-07 DIAGNOSIS — E785 Hyperlipidemia, unspecified: Secondary | ICD-10-CM | POA: Diagnosis not present

## 2023-10-07 DIAGNOSIS — C50919 Malignant neoplasm of unspecified site of unspecified female breast: Secondary | ICD-10-CM | POA: Diagnosis not present

## 2023-10-07 DIAGNOSIS — K219 Gastro-esophageal reflux disease without esophagitis: Secondary | ICD-10-CM | POA: Diagnosis not present

## 2023-10-07 DIAGNOSIS — I10 Essential (primary) hypertension: Secondary | ICD-10-CM | POA: Diagnosis not present

## 2023-10-07 DIAGNOSIS — C7951 Secondary malignant neoplasm of bone: Secondary | ICD-10-CM | POA: Diagnosis not present

## 2023-10-07 DIAGNOSIS — I4891 Unspecified atrial fibrillation: Secondary | ICD-10-CM | POA: Diagnosis not present

## 2023-10-08 ENCOUNTER — Ambulatory Visit

## 2023-10-08 ENCOUNTER — Other Ambulatory Visit

## 2023-10-08 ENCOUNTER — Ambulatory Visit: Admitting: Hematology & Oncology

## 2023-10-10 DIAGNOSIS — K219 Gastro-esophageal reflux disease without esophagitis: Secondary | ICD-10-CM | POA: Diagnosis not present

## 2023-10-10 DIAGNOSIS — C50919 Malignant neoplasm of unspecified site of unspecified female breast: Secondary | ICD-10-CM | POA: Diagnosis not present

## 2023-10-10 DIAGNOSIS — E785 Hyperlipidemia, unspecified: Secondary | ICD-10-CM | POA: Diagnosis not present

## 2023-10-10 DIAGNOSIS — I10 Essential (primary) hypertension: Secondary | ICD-10-CM | POA: Diagnosis not present

## 2023-10-10 DIAGNOSIS — I4891 Unspecified atrial fibrillation: Secondary | ICD-10-CM | POA: Diagnosis not present

## 2023-10-10 DIAGNOSIS — C7951 Secondary malignant neoplasm of bone: Secondary | ICD-10-CM | POA: Diagnosis not present

## 2023-10-12 DIAGNOSIS — F0390 Unspecified dementia without behavioral disturbance: Secondary | ICD-10-CM | POA: Diagnosis not present

## 2023-10-12 DIAGNOSIS — K219 Gastro-esophageal reflux disease without esophagitis: Secondary | ICD-10-CM | POA: Diagnosis not present

## 2023-10-12 DIAGNOSIS — I5022 Chronic systolic (congestive) heart failure: Secondary | ICD-10-CM | POA: Diagnosis not present

## 2023-10-12 DIAGNOSIS — C50919 Malignant neoplasm of unspecified site of unspecified female breast: Secondary | ICD-10-CM | POA: Diagnosis not present

## 2023-10-12 DIAGNOSIS — I4891 Unspecified atrial fibrillation: Secondary | ICD-10-CM | POA: Diagnosis not present

## 2023-10-12 DIAGNOSIS — C7951 Secondary malignant neoplasm of bone: Secondary | ICD-10-CM | POA: Diagnosis not present

## 2023-10-12 DIAGNOSIS — E785 Hyperlipidemia, unspecified: Secondary | ICD-10-CM | POA: Diagnosis not present

## 2023-10-12 DIAGNOSIS — I10 Essential (primary) hypertension: Secondary | ICD-10-CM | POA: Diagnosis not present

## 2023-10-13 DIAGNOSIS — E785 Hyperlipidemia, unspecified: Secondary | ICD-10-CM | POA: Diagnosis not present

## 2023-10-13 DIAGNOSIS — I4891 Unspecified atrial fibrillation: Secondary | ICD-10-CM | POA: Diagnosis not present

## 2023-10-13 DIAGNOSIS — C7951 Secondary malignant neoplasm of bone: Secondary | ICD-10-CM | POA: Diagnosis not present

## 2023-10-13 DIAGNOSIS — C50919 Malignant neoplasm of unspecified site of unspecified female breast: Secondary | ICD-10-CM | POA: Diagnosis not present

## 2023-10-13 DIAGNOSIS — K219 Gastro-esophageal reflux disease without esophagitis: Secondary | ICD-10-CM | POA: Diagnosis not present

## 2023-10-13 DIAGNOSIS — I10 Essential (primary) hypertension: Secondary | ICD-10-CM | POA: Diagnosis not present

## 2023-10-14 DIAGNOSIS — K219 Gastro-esophageal reflux disease without esophagitis: Secondary | ICD-10-CM | POA: Diagnosis not present

## 2023-10-14 DIAGNOSIS — C50919 Malignant neoplasm of unspecified site of unspecified female breast: Secondary | ICD-10-CM | POA: Diagnosis not present

## 2023-10-14 DIAGNOSIS — I4891 Unspecified atrial fibrillation: Secondary | ICD-10-CM | POA: Diagnosis not present

## 2023-10-14 DIAGNOSIS — E785 Hyperlipidemia, unspecified: Secondary | ICD-10-CM | POA: Diagnosis not present

## 2023-10-14 DIAGNOSIS — C7951 Secondary malignant neoplasm of bone: Secondary | ICD-10-CM | POA: Diagnosis not present

## 2023-10-14 DIAGNOSIS — I10 Essential (primary) hypertension: Secondary | ICD-10-CM | POA: Diagnosis not present

## 2023-10-17 DIAGNOSIS — I10 Essential (primary) hypertension: Secondary | ICD-10-CM | POA: Diagnosis not present

## 2023-10-17 DIAGNOSIS — C50919 Malignant neoplasm of unspecified site of unspecified female breast: Secondary | ICD-10-CM | POA: Diagnosis not present

## 2023-10-17 DIAGNOSIS — I4891 Unspecified atrial fibrillation: Secondary | ICD-10-CM | POA: Diagnosis not present

## 2023-10-17 DIAGNOSIS — E785 Hyperlipidemia, unspecified: Secondary | ICD-10-CM | POA: Diagnosis not present

## 2023-10-17 DIAGNOSIS — C7951 Secondary malignant neoplasm of bone: Secondary | ICD-10-CM | POA: Diagnosis not present

## 2023-10-17 DIAGNOSIS — K219 Gastro-esophageal reflux disease without esophagitis: Secondary | ICD-10-CM | POA: Diagnosis not present

## 2023-10-18 DIAGNOSIS — C50919 Malignant neoplasm of unspecified site of unspecified female breast: Secondary | ICD-10-CM | POA: Diagnosis not present

## 2023-10-18 DIAGNOSIS — E038 Other specified hypothyroidism: Secondary | ICD-10-CM | POA: Diagnosis not present

## 2023-10-18 DIAGNOSIS — I10 Essential (primary) hypertension: Secondary | ICD-10-CM | POA: Diagnosis not present

## 2023-10-18 DIAGNOSIS — K219 Gastro-esophageal reflux disease without esophagitis: Secondary | ICD-10-CM | POA: Diagnosis not present

## 2023-10-18 DIAGNOSIS — K5901 Slow transit constipation: Secondary | ICD-10-CM | POA: Diagnosis not present

## 2023-10-18 DIAGNOSIS — Z79899 Other long term (current) drug therapy: Secondary | ICD-10-CM | POA: Diagnosis not present

## 2023-10-18 DIAGNOSIS — E785 Hyperlipidemia, unspecified: Secondary | ICD-10-CM | POA: Diagnosis not present

## 2023-10-18 DIAGNOSIS — R000 Tachycardia, unspecified: Secondary | ICD-10-CM | POA: Diagnosis not present

## 2023-10-18 DIAGNOSIS — I482 Chronic atrial fibrillation, unspecified: Secondary | ICD-10-CM | POA: Diagnosis not present

## 2023-10-18 DIAGNOSIS — C7951 Secondary malignant neoplasm of bone: Secondary | ICD-10-CM | POA: Diagnosis not present

## 2023-10-18 DIAGNOSIS — I5022 Chronic systolic (congestive) heart failure: Secondary | ICD-10-CM | POA: Diagnosis not present

## 2023-10-18 DIAGNOSIS — I4891 Unspecified atrial fibrillation: Secondary | ICD-10-CM | POA: Diagnosis not present

## 2023-10-19 ENCOUNTER — Telehealth (HOSPITAL_COMMUNITY): Payer: Self-pay | Admitting: Cardiology

## 2023-10-19 NOTE — Telephone Encounter (Signed)
 Will forward to the ordering MD and covering RN as a general FYI.

## 2023-10-19 NOTE — Telephone Encounter (Signed)
 Patient called and cancelled echocardiogram for Feb 2026 due to she in now in [facility] care.

## 2023-10-20 DIAGNOSIS — I4891 Unspecified atrial fibrillation: Secondary | ICD-10-CM | POA: Diagnosis not present

## 2023-10-20 DIAGNOSIS — C50919 Malignant neoplasm of unspecified site of unspecified female breast: Secondary | ICD-10-CM | POA: Diagnosis not present

## 2023-10-20 DIAGNOSIS — K219 Gastro-esophageal reflux disease without esophagitis: Secondary | ICD-10-CM | POA: Diagnosis not present

## 2023-10-20 DIAGNOSIS — C7951 Secondary malignant neoplasm of bone: Secondary | ICD-10-CM | POA: Diagnosis not present

## 2023-10-20 DIAGNOSIS — E785 Hyperlipidemia, unspecified: Secondary | ICD-10-CM | POA: Diagnosis not present

## 2023-10-20 DIAGNOSIS — I10 Essential (primary) hypertension: Secondary | ICD-10-CM | POA: Diagnosis not present

## 2023-10-21 DIAGNOSIS — E785 Hyperlipidemia, unspecified: Secondary | ICD-10-CM | POA: Diagnosis not present

## 2023-10-21 DIAGNOSIS — I10 Essential (primary) hypertension: Secondary | ICD-10-CM | POA: Diagnosis not present

## 2023-10-21 DIAGNOSIS — C7951 Secondary malignant neoplasm of bone: Secondary | ICD-10-CM | POA: Diagnosis not present

## 2023-10-21 DIAGNOSIS — C50919 Malignant neoplasm of unspecified site of unspecified female breast: Secondary | ICD-10-CM | POA: Diagnosis not present

## 2023-10-21 DIAGNOSIS — K219 Gastro-esophageal reflux disease without esophagitis: Secondary | ICD-10-CM | POA: Diagnosis not present

## 2023-10-21 DIAGNOSIS — I4891 Unspecified atrial fibrillation: Secondary | ICD-10-CM | POA: Diagnosis not present

## 2023-10-24 DIAGNOSIS — K219 Gastro-esophageal reflux disease without esophagitis: Secondary | ICD-10-CM | POA: Diagnosis not present

## 2023-10-24 DIAGNOSIS — I10 Essential (primary) hypertension: Secondary | ICD-10-CM | POA: Diagnosis not present

## 2023-10-24 DIAGNOSIS — I4891 Unspecified atrial fibrillation: Secondary | ICD-10-CM | POA: Diagnosis not present

## 2023-10-24 DIAGNOSIS — E785 Hyperlipidemia, unspecified: Secondary | ICD-10-CM | POA: Diagnosis not present

## 2023-10-24 DIAGNOSIS — C50919 Malignant neoplasm of unspecified site of unspecified female breast: Secondary | ICD-10-CM | POA: Diagnosis not present

## 2023-10-24 DIAGNOSIS — C7951 Secondary malignant neoplasm of bone: Secondary | ICD-10-CM | POA: Diagnosis not present

## 2023-10-27 DIAGNOSIS — I4891 Unspecified atrial fibrillation: Secondary | ICD-10-CM | POA: Diagnosis not present

## 2023-10-27 DIAGNOSIS — C50919 Malignant neoplasm of unspecified site of unspecified female breast: Secondary | ICD-10-CM | POA: Diagnosis not present

## 2023-10-27 DIAGNOSIS — K219 Gastro-esophageal reflux disease without esophagitis: Secondary | ICD-10-CM | POA: Diagnosis not present

## 2023-10-27 DIAGNOSIS — E785 Hyperlipidemia, unspecified: Secondary | ICD-10-CM | POA: Diagnosis not present

## 2023-10-27 DIAGNOSIS — C7951 Secondary malignant neoplasm of bone: Secondary | ICD-10-CM | POA: Diagnosis not present

## 2023-10-27 DIAGNOSIS — I10 Essential (primary) hypertension: Secondary | ICD-10-CM | POA: Diagnosis not present

## 2023-10-28 DIAGNOSIS — I4891 Unspecified atrial fibrillation: Secondary | ICD-10-CM | POA: Diagnosis not present

## 2023-10-28 DIAGNOSIS — K219 Gastro-esophageal reflux disease without esophagitis: Secondary | ICD-10-CM | POA: Diagnosis not present

## 2023-10-28 DIAGNOSIS — E785 Hyperlipidemia, unspecified: Secondary | ICD-10-CM | POA: Diagnosis not present

## 2023-10-28 DIAGNOSIS — C50919 Malignant neoplasm of unspecified site of unspecified female breast: Secondary | ICD-10-CM | POA: Diagnosis not present

## 2023-10-28 DIAGNOSIS — I10 Essential (primary) hypertension: Secondary | ICD-10-CM | POA: Diagnosis not present

## 2023-10-28 DIAGNOSIS — C7951 Secondary malignant neoplasm of bone: Secondary | ICD-10-CM | POA: Diagnosis not present

## 2023-10-29 ENCOUNTER — Other Ambulatory Visit

## 2023-10-29 ENCOUNTER — Ambulatory Visit

## 2023-10-29 ENCOUNTER — Ambulatory Visit: Admitting: Hematology & Oncology

## 2023-10-29 DIAGNOSIS — I10 Essential (primary) hypertension: Secondary | ICD-10-CM | POA: Diagnosis not present

## 2023-10-29 DIAGNOSIS — C7951 Secondary malignant neoplasm of bone: Secondary | ICD-10-CM | POA: Diagnosis not present

## 2023-10-29 DIAGNOSIS — K219 Gastro-esophageal reflux disease without esophagitis: Secondary | ICD-10-CM | POA: Diagnosis not present

## 2023-10-29 DIAGNOSIS — I4891 Unspecified atrial fibrillation: Secondary | ICD-10-CM | POA: Diagnosis not present

## 2023-10-29 DIAGNOSIS — E785 Hyperlipidemia, unspecified: Secondary | ICD-10-CM | POA: Diagnosis not present

## 2023-10-29 DIAGNOSIS — C50919 Malignant neoplasm of unspecified site of unspecified female breast: Secondary | ICD-10-CM | POA: Diagnosis not present

## 2023-10-31 DIAGNOSIS — I4891 Unspecified atrial fibrillation: Secondary | ICD-10-CM | POA: Diagnosis not present

## 2023-10-31 DIAGNOSIS — I10 Essential (primary) hypertension: Secondary | ICD-10-CM | POA: Diagnosis not present

## 2023-10-31 DIAGNOSIS — C50919 Malignant neoplasm of unspecified site of unspecified female breast: Secondary | ICD-10-CM | POA: Diagnosis not present

## 2023-10-31 DIAGNOSIS — K219 Gastro-esophageal reflux disease without esophagitis: Secondary | ICD-10-CM | POA: Diagnosis not present

## 2023-10-31 DIAGNOSIS — E785 Hyperlipidemia, unspecified: Secondary | ICD-10-CM | POA: Diagnosis not present

## 2023-10-31 DIAGNOSIS — C7951 Secondary malignant neoplasm of bone: Secondary | ICD-10-CM | POA: Diagnosis not present

## 2023-11-01 DIAGNOSIS — G4089 Other seizures: Secondary | ICD-10-CM | POA: Diagnosis not present

## 2023-11-01 DIAGNOSIS — E039 Hypothyroidism, unspecified: Secondary | ICD-10-CM | POA: Diagnosis not present

## 2023-11-01 DIAGNOSIS — J4530 Mild persistent asthma, uncomplicated: Secondary | ICD-10-CM | POA: Diagnosis not present

## 2023-11-01 DIAGNOSIS — I5032 Chronic diastolic (congestive) heart failure: Secondary | ICD-10-CM | POA: Diagnosis not present

## 2023-11-01 DIAGNOSIS — H44519 Absolute glaucoma, unspecified eye: Secondary | ICD-10-CM | POA: Diagnosis not present

## 2023-11-01 DIAGNOSIS — I10 Essential (primary) hypertension: Secondary | ICD-10-CM | POA: Diagnosis not present

## 2023-11-01 DIAGNOSIS — I679 Cerebrovascular disease, unspecified: Secondary | ICD-10-CM | POA: Diagnosis not present

## 2023-11-01 DIAGNOSIS — I4891 Unspecified atrial fibrillation: Secondary | ICD-10-CM | POA: Diagnosis not present

## 2023-11-01 DIAGNOSIS — C50919 Malignant neoplasm of unspecified site of unspecified female breast: Secondary | ICD-10-CM | POA: Diagnosis not present

## 2023-11-01 DIAGNOSIS — C7951 Secondary malignant neoplasm of bone: Secondary | ICD-10-CM | POA: Diagnosis not present

## 2023-11-01 DIAGNOSIS — K219 Gastro-esophageal reflux disease without esophagitis: Secondary | ICD-10-CM | POA: Diagnosis not present

## 2023-11-01 DIAGNOSIS — E785 Hyperlipidemia, unspecified: Secondary | ICD-10-CM | POA: Diagnosis not present

## 2023-11-02 DIAGNOSIS — C7951 Secondary malignant neoplasm of bone: Secondary | ICD-10-CM | POA: Diagnosis not present

## 2023-11-02 DIAGNOSIS — I4891 Unspecified atrial fibrillation: Secondary | ICD-10-CM | POA: Diagnosis not present

## 2023-11-02 DIAGNOSIS — C50919 Malignant neoplasm of unspecified site of unspecified female breast: Secondary | ICD-10-CM | POA: Diagnosis not present

## 2023-11-02 DIAGNOSIS — E785 Hyperlipidemia, unspecified: Secondary | ICD-10-CM | POA: Diagnosis not present

## 2023-11-02 DIAGNOSIS — I10 Essential (primary) hypertension: Secondary | ICD-10-CM | POA: Diagnosis not present

## 2023-11-02 DIAGNOSIS — K219 Gastro-esophageal reflux disease without esophagitis: Secondary | ICD-10-CM | POA: Diagnosis not present

## 2023-11-04 DIAGNOSIS — I10 Essential (primary) hypertension: Secondary | ICD-10-CM | POA: Diagnosis not present

## 2023-11-04 DIAGNOSIS — E785 Hyperlipidemia, unspecified: Secondary | ICD-10-CM | POA: Diagnosis not present

## 2023-11-04 DIAGNOSIS — C50919 Malignant neoplasm of unspecified site of unspecified female breast: Secondary | ICD-10-CM | POA: Diagnosis not present

## 2023-11-04 DIAGNOSIS — C7951 Secondary malignant neoplasm of bone: Secondary | ICD-10-CM | POA: Diagnosis not present

## 2023-11-04 DIAGNOSIS — K219 Gastro-esophageal reflux disease without esophagitis: Secondary | ICD-10-CM | POA: Diagnosis not present

## 2023-11-04 DIAGNOSIS — I4891 Unspecified atrial fibrillation: Secondary | ICD-10-CM | POA: Diagnosis not present

## 2023-11-07 DIAGNOSIS — K219 Gastro-esophageal reflux disease without esophagitis: Secondary | ICD-10-CM | POA: Diagnosis not present

## 2023-11-07 DIAGNOSIS — I10 Essential (primary) hypertension: Secondary | ICD-10-CM | POA: Diagnosis not present

## 2023-11-07 DIAGNOSIS — C50919 Malignant neoplasm of unspecified site of unspecified female breast: Secondary | ICD-10-CM | POA: Diagnosis not present

## 2023-11-07 DIAGNOSIS — C7951 Secondary malignant neoplasm of bone: Secondary | ICD-10-CM | POA: Diagnosis not present

## 2023-11-07 DIAGNOSIS — E785 Hyperlipidemia, unspecified: Secondary | ICD-10-CM | POA: Diagnosis not present

## 2023-11-07 DIAGNOSIS — I4891 Unspecified atrial fibrillation: Secondary | ICD-10-CM | POA: Diagnosis not present

## 2023-11-09 DIAGNOSIS — K219 Gastro-esophageal reflux disease without esophagitis: Secondary | ICD-10-CM | POA: Diagnosis not present

## 2023-11-09 DIAGNOSIS — F0390 Unspecified dementia without behavioral disturbance: Secondary | ICD-10-CM | POA: Diagnosis not present

## 2023-11-09 DIAGNOSIS — C50919 Malignant neoplasm of unspecified site of unspecified female breast: Secondary | ICD-10-CM | POA: Diagnosis not present

## 2023-11-09 DIAGNOSIS — I4891 Unspecified atrial fibrillation: Secondary | ICD-10-CM | POA: Diagnosis not present

## 2023-11-09 DIAGNOSIS — I10 Essential (primary) hypertension: Secondary | ICD-10-CM | POA: Diagnosis not present

## 2023-11-09 DIAGNOSIS — I5022 Chronic systolic (congestive) heart failure: Secondary | ICD-10-CM | POA: Diagnosis not present

## 2023-11-09 DIAGNOSIS — E785 Hyperlipidemia, unspecified: Secondary | ICD-10-CM | POA: Diagnosis not present

## 2023-11-09 DIAGNOSIS — C7951 Secondary malignant neoplasm of bone: Secondary | ICD-10-CM | POA: Diagnosis not present

## 2023-11-10 DIAGNOSIS — I10 Essential (primary) hypertension: Secondary | ICD-10-CM | POA: Diagnosis not present

## 2023-11-10 DIAGNOSIS — C7951 Secondary malignant neoplasm of bone: Secondary | ICD-10-CM | POA: Diagnosis not present

## 2023-11-10 DIAGNOSIS — C50919 Malignant neoplasm of unspecified site of unspecified female breast: Secondary | ICD-10-CM | POA: Diagnosis not present

## 2023-11-10 DIAGNOSIS — K219 Gastro-esophageal reflux disease without esophagitis: Secondary | ICD-10-CM | POA: Diagnosis not present

## 2023-11-10 DIAGNOSIS — E785 Hyperlipidemia, unspecified: Secondary | ICD-10-CM | POA: Diagnosis not present

## 2023-11-10 DIAGNOSIS — I4891 Unspecified atrial fibrillation: Secondary | ICD-10-CM | POA: Diagnosis not present

## 2023-11-11 DIAGNOSIS — C50919 Malignant neoplasm of unspecified site of unspecified female breast: Secondary | ICD-10-CM | POA: Diagnosis not present

## 2023-11-11 DIAGNOSIS — K219 Gastro-esophageal reflux disease without esophagitis: Secondary | ICD-10-CM | POA: Diagnosis not present

## 2023-11-11 DIAGNOSIS — C7951 Secondary malignant neoplasm of bone: Secondary | ICD-10-CM | POA: Diagnosis not present

## 2023-11-11 DIAGNOSIS — I10 Essential (primary) hypertension: Secondary | ICD-10-CM | POA: Diagnosis not present

## 2023-11-11 DIAGNOSIS — E785 Hyperlipidemia, unspecified: Secondary | ICD-10-CM | POA: Diagnosis not present

## 2023-11-11 DIAGNOSIS — I4891 Unspecified atrial fibrillation: Secondary | ICD-10-CM | POA: Diagnosis not present

## 2023-11-14 DIAGNOSIS — E785 Hyperlipidemia, unspecified: Secondary | ICD-10-CM | POA: Diagnosis not present

## 2023-11-14 DIAGNOSIS — K219 Gastro-esophageal reflux disease without esophagitis: Secondary | ICD-10-CM | POA: Diagnosis not present

## 2023-11-14 DIAGNOSIS — C50919 Malignant neoplasm of unspecified site of unspecified female breast: Secondary | ICD-10-CM | POA: Diagnosis not present

## 2023-11-14 DIAGNOSIS — I10 Essential (primary) hypertension: Secondary | ICD-10-CM | POA: Diagnosis not present

## 2023-11-14 DIAGNOSIS — C7951 Secondary malignant neoplasm of bone: Secondary | ICD-10-CM | POA: Diagnosis not present

## 2023-11-14 DIAGNOSIS — I4891 Unspecified atrial fibrillation: Secondary | ICD-10-CM | POA: Diagnosis not present

## 2023-11-15 DIAGNOSIS — I4891 Unspecified atrial fibrillation: Secondary | ICD-10-CM | POA: Diagnosis not present

## 2023-11-15 DIAGNOSIS — C7951 Secondary malignant neoplasm of bone: Secondary | ICD-10-CM | POA: Diagnosis not present

## 2023-11-15 DIAGNOSIS — E785 Hyperlipidemia, unspecified: Secondary | ICD-10-CM | POA: Diagnosis not present

## 2023-11-15 DIAGNOSIS — K219 Gastro-esophageal reflux disease without esophagitis: Secondary | ICD-10-CM | POA: Diagnosis not present

## 2023-11-15 DIAGNOSIS — C50919 Malignant neoplasm of unspecified site of unspecified female breast: Secondary | ICD-10-CM | POA: Diagnosis not present

## 2023-11-15 DIAGNOSIS — I10 Essential (primary) hypertension: Secondary | ICD-10-CM | POA: Diagnosis not present

## 2023-11-17 DIAGNOSIS — C50919 Malignant neoplasm of unspecified site of unspecified female breast: Secondary | ICD-10-CM | POA: Diagnosis not present

## 2023-11-17 DIAGNOSIS — I4891 Unspecified atrial fibrillation: Secondary | ICD-10-CM | POA: Diagnosis not present

## 2023-11-17 DIAGNOSIS — K219 Gastro-esophageal reflux disease without esophagitis: Secondary | ICD-10-CM | POA: Diagnosis not present

## 2023-11-17 DIAGNOSIS — E785 Hyperlipidemia, unspecified: Secondary | ICD-10-CM | POA: Diagnosis not present

## 2023-11-17 DIAGNOSIS — I10 Essential (primary) hypertension: Secondary | ICD-10-CM | POA: Diagnosis not present

## 2023-11-17 DIAGNOSIS — C7951 Secondary malignant neoplasm of bone: Secondary | ICD-10-CM | POA: Diagnosis not present

## 2023-11-18 DIAGNOSIS — I10 Essential (primary) hypertension: Secondary | ICD-10-CM | POA: Diagnosis not present

## 2023-11-18 DIAGNOSIS — C50919 Malignant neoplasm of unspecified site of unspecified female breast: Secondary | ICD-10-CM | POA: Diagnosis not present

## 2023-11-18 DIAGNOSIS — E785 Hyperlipidemia, unspecified: Secondary | ICD-10-CM | POA: Diagnosis not present

## 2023-11-18 DIAGNOSIS — K219 Gastro-esophageal reflux disease without esophagitis: Secondary | ICD-10-CM | POA: Diagnosis not present

## 2023-11-18 DIAGNOSIS — I4891 Unspecified atrial fibrillation: Secondary | ICD-10-CM | POA: Diagnosis not present

## 2023-11-18 DIAGNOSIS — C7951 Secondary malignant neoplasm of bone: Secondary | ICD-10-CM | POA: Diagnosis not present

## 2023-11-21 DIAGNOSIS — K219 Gastro-esophageal reflux disease without esophagitis: Secondary | ICD-10-CM | POA: Diagnosis not present

## 2023-11-21 DIAGNOSIS — I10 Essential (primary) hypertension: Secondary | ICD-10-CM | POA: Diagnosis not present

## 2023-11-21 DIAGNOSIS — C7951 Secondary malignant neoplasm of bone: Secondary | ICD-10-CM | POA: Diagnosis not present

## 2023-11-21 DIAGNOSIS — E785 Hyperlipidemia, unspecified: Secondary | ICD-10-CM | POA: Diagnosis not present

## 2023-11-21 DIAGNOSIS — I4891 Unspecified atrial fibrillation: Secondary | ICD-10-CM | POA: Diagnosis not present

## 2023-11-21 DIAGNOSIS — C50919 Malignant neoplasm of unspecified site of unspecified female breast: Secondary | ICD-10-CM | POA: Diagnosis not present

## 2023-11-22 DIAGNOSIS — I10 Essential (primary) hypertension: Secondary | ICD-10-CM | POA: Diagnosis not present

## 2023-11-22 DIAGNOSIS — E785 Hyperlipidemia, unspecified: Secondary | ICD-10-CM | POA: Diagnosis not present

## 2023-11-22 DIAGNOSIS — K219 Gastro-esophageal reflux disease without esophagitis: Secondary | ICD-10-CM | POA: Diagnosis not present

## 2023-11-22 DIAGNOSIS — I4891 Unspecified atrial fibrillation: Secondary | ICD-10-CM | POA: Diagnosis not present

## 2023-11-22 DIAGNOSIS — C7951 Secondary malignant neoplasm of bone: Secondary | ICD-10-CM | POA: Diagnosis not present

## 2023-11-22 DIAGNOSIS — C50919 Malignant neoplasm of unspecified site of unspecified female breast: Secondary | ICD-10-CM | POA: Diagnosis not present

## 2023-11-23 DIAGNOSIS — I4891 Unspecified atrial fibrillation: Secondary | ICD-10-CM | POA: Diagnosis not present

## 2023-11-23 DIAGNOSIS — I10 Essential (primary) hypertension: Secondary | ICD-10-CM | POA: Diagnosis not present

## 2023-11-23 DIAGNOSIS — C7951 Secondary malignant neoplasm of bone: Secondary | ICD-10-CM | POA: Diagnosis not present

## 2023-11-23 DIAGNOSIS — N390 Urinary tract infection, site not specified: Secondary | ICD-10-CM | POA: Diagnosis not present

## 2023-11-23 DIAGNOSIS — C50919 Malignant neoplasm of unspecified site of unspecified female breast: Secondary | ICD-10-CM | POA: Diagnosis not present

## 2023-11-23 DIAGNOSIS — E785 Hyperlipidemia, unspecified: Secondary | ICD-10-CM | POA: Diagnosis not present

## 2023-11-23 DIAGNOSIS — K219 Gastro-esophageal reflux disease without esophagitis: Secondary | ICD-10-CM | POA: Diagnosis not present

## 2023-11-25 DIAGNOSIS — K219 Gastro-esophageal reflux disease without esophagitis: Secondary | ICD-10-CM | POA: Diagnosis not present

## 2023-11-25 DIAGNOSIS — I10 Essential (primary) hypertension: Secondary | ICD-10-CM | POA: Diagnosis not present

## 2023-11-25 DIAGNOSIS — C50919 Malignant neoplasm of unspecified site of unspecified female breast: Secondary | ICD-10-CM | POA: Diagnosis not present

## 2023-11-25 DIAGNOSIS — C7951 Secondary malignant neoplasm of bone: Secondary | ICD-10-CM | POA: Diagnosis not present

## 2023-11-25 DIAGNOSIS — E785 Hyperlipidemia, unspecified: Secondary | ICD-10-CM | POA: Diagnosis not present

## 2023-11-25 DIAGNOSIS — I4891 Unspecified atrial fibrillation: Secondary | ICD-10-CM | POA: Diagnosis not present

## 2023-11-26 DIAGNOSIS — C50919 Malignant neoplasm of unspecified site of unspecified female breast: Secondary | ICD-10-CM | POA: Diagnosis not present

## 2023-11-26 DIAGNOSIS — E785 Hyperlipidemia, unspecified: Secondary | ICD-10-CM | POA: Diagnosis not present

## 2023-11-26 DIAGNOSIS — K219 Gastro-esophageal reflux disease without esophagitis: Secondary | ICD-10-CM | POA: Diagnosis not present

## 2023-11-26 DIAGNOSIS — I10 Essential (primary) hypertension: Secondary | ICD-10-CM | POA: Diagnosis not present

## 2023-11-26 DIAGNOSIS — I4891 Unspecified atrial fibrillation: Secondary | ICD-10-CM | POA: Diagnosis not present

## 2023-11-26 DIAGNOSIS — C7951 Secondary malignant neoplasm of bone: Secondary | ICD-10-CM | POA: Diagnosis not present

## 2023-11-29 DIAGNOSIS — E785 Hyperlipidemia, unspecified: Secondary | ICD-10-CM | POA: Diagnosis not present

## 2023-11-29 DIAGNOSIS — I10 Essential (primary) hypertension: Secondary | ICD-10-CM | POA: Diagnosis not present

## 2023-11-29 DIAGNOSIS — K219 Gastro-esophageal reflux disease without esophagitis: Secondary | ICD-10-CM | POA: Diagnosis not present

## 2023-11-29 DIAGNOSIS — C50919 Malignant neoplasm of unspecified site of unspecified female breast: Secondary | ICD-10-CM | POA: Diagnosis not present

## 2023-11-29 DIAGNOSIS — C7951 Secondary malignant neoplasm of bone: Secondary | ICD-10-CM | POA: Diagnosis not present

## 2023-11-29 DIAGNOSIS — I4891 Unspecified atrial fibrillation: Secondary | ICD-10-CM | POA: Diagnosis not present

## 2023-11-30 DIAGNOSIS — I4891 Unspecified atrial fibrillation: Secondary | ICD-10-CM | POA: Diagnosis not present

## 2023-11-30 DIAGNOSIS — K219 Gastro-esophageal reflux disease without esophagitis: Secondary | ICD-10-CM | POA: Diagnosis not present

## 2023-11-30 DIAGNOSIS — E785 Hyperlipidemia, unspecified: Secondary | ICD-10-CM | POA: Diagnosis not present

## 2023-11-30 DIAGNOSIS — C7951 Secondary malignant neoplasm of bone: Secondary | ICD-10-CM | POA: Diagnosis not present

## 2023-11-30 DIAGNOSIS — C50919 Malignant neoplasm of unspecified site of unspecified female breast: Secondary | ICD-10-CM | POA: Diagnosis not present

## 2023-11-30 DIAGNOSIS — I10 Essential (primary) hypertension: Secondary | ICD-10-CM | POA: Diagnosis not present

## 2023-12-01 DIAGNOSIS — E039 Hypothyroidism, unspecified: Secondary | ICD-10-CM | POA: Diagnosis not present

## 2023-12-01 DIAGNOSIS — J4530 Mild persistent asthma, uncomplicated: Secondary | ICD-10-CM | POA: Diagnosis not present

## 2023-12-01 DIAGNOSIS — C7951 Secondary malignant neoplasm of bone: Secondary | ICD-10-CM | POA: Diagnosis not present

## 2023-12-01 DIAGNOSIS — I10 Essential (primary) hypertension: Secondary | ICD-10-CM | POA: Diagnosis not present

## 2023-12-01 DIAGNOSIS — E785 Hyperlipidemia, unspecified: Secondary | ICD-10-CM | POA: Diagnosis not present

## 2023-12-01 DIAGNOSIS — H44519 Absolute glaucoma, unspecified eye: Secondary | ICD-10-CM | POA: Diagnosis not present

## 2023-12-01 DIAGNOSIS — C50919 Malignant neoplasm of unspecified site of unspecified female breast: Secondary | ICD-10-CM | POA: Diagnosis not present

## 2023-12-01 DIAGNOSIS — G4089 Other seizures: Secondary | ICD-10-CM | POA: Diagnosis not present

## 2023-12-01 DIAGNOSIS — I5032 Chronic diastolic (congestive) heart failure: Secondary | ICD-10-CM | POA: Diagnosis not present

## 2023-12-01 DIAGNOSIS — I679 Cerebrovascular disease, unspecified: Secondary | ICD-10-CM | POA: Diagnosis not present

## 2023-12-01 DIAGNOSIS — I4891 Unspecified atrial fibrillation: Secondary | ICD-10-CM | POA: Diagnosis not present

## 2023-12-01 DIAGNOSIS — K219 Gastro-esophageal reflux disease without esophagitis: Secondary | ICD-10-CM | POA: Diagnosis not present

## 2023-12-03 DIAGNOSIS — C7951 Secondary malignant neoplasm of bone: Secondary | ICD-10-CM | POA: Diagnosis not present

## 2023-12-03 DIAGNOSIS — I10 Essential (primary) hypertension: Secondary | ICD-10-CM | POA: Diagnosis not present

## 2023-12-03 DIAGNOSIS — K219 Gastro-esophageal reflux disease without esophagitis: Secondary | ICD-10-CM | POA: Diagnosis not present

## 2023-12-03 DIAGNOSIS — I4891 Unspecified atrial fibrillation: Secondary | ICD-10-CM | POA: Diagnosis not present

## 2023-12-03 DIAGNOSIS — C50919 Malignant neoplasm of unspecified site of unspecified female breast: Secondary | ICD-10-CM | POA: Diagnosis not present

## 2023-12-03 DIAGNOSIS — E785 Hyperlipidemia, unspecified: Secondary | ICD-10-CM | POA: Diagnosis not present

## 2023-12-05 ENCOUNTER — Emergency Department (HOSPITAL_COMMUNITY)

## 2023-12-05 ENCOUNTER — Inpatient Hospital Stay (HOSPITAL_COMMUNITY)
Admission: EM | Admit: 2023-12-05 | Discharge: 2023-12-07 | DRG: 299 | Disposition: A | Discharge status: Hospice | Source: Skilled Nursing Facility | Attending: Internal Medicine | Admitting: Internal Medicine

## 2023-12-05 DIAGNOSIS — I82432 Acute embolism and thrombosis of left popliteal vein: Secondary | ICD-10-CM | POA: Diagnosis present

## 2023-12-05 DIAGNOSIS — I340 Nonrheumatic mitral (valve) insufficiency: Secondary | ICD-10-CM | POA: Diagnosis present

## 2023-12-05 DIAGNOSIS — I82422 Acute embolism and thrombosis of left iliac vein: Principal | ICD-10-CM | POA: Diagnosis present

## 2023-12-05 DIAGNOSIS — Z66 Do not resuscitate: Secondary | ICD-10-CM | POA: Diagnosis not present

## 2023-12-05 DIAGNOSIS — R609 Edema, unspecified: Secondary | ICD-10-CM | POA: Diagnosis not present

## 2023-12-05 DIAGNOSIS — K219 Gastro-esophageal reflux disease without esophagitis: Secondary | ICD-10-CM | POA: Diagnosis present

## 2023-12-05 DIAGNOSIS — I5032 Chronic diastolic (congestive) heart failure: Secondary | ICD-10-CM | POA: Diagnosis present

## 2023-12-05 DIAGNOSIS — I82452 Acute embolism and thrombosis of left peroneal vein: Secondary | ICD-10-CM | POA: Diagnosis present

## 2023-12-05 DIAGNOSIS — I130 Hypertensive heart and chronic kidney disease with heart failure and stage 1 through stage 4 chronic kidney disease, or unspecified chronic kidney disease: Secondary | ICD-10-CM | POA: Diagnosis present

## 2023-12-05 DIAGNOSIS — Z882 Allergy status to sulfonamides status: Secondary | ICD-10-CM

## 2023-12-05 DIAGNOSIS — C7989 Secondary malignant neoplasm of other specified sites: Secondary | ICD-10-CM | POA: Diagnosis present

## 2023-12-05 DIAGNOSIS — C7951 Secondary malignant neoplasm of bone: Secondary | ICD-10-CM

## 2023-12-05 DIAGNOSIS — Z8249 Family history of ischemic heart disease and other diseases of the circulatory system: Secondary | ICD-10-CM

## 2023-12-05 DIAGNOSIS — R079 Chest pain, unspecified: Secondary | ICD-10-CM | POA: Diagnosis not present

## 2023-12-05 DIAGNOSIS — N3289 Other specified disorders of bladder: Secondary | ICD-10-CM | POA: Diagnosis not present

## 2023-12-05 DIAGNOSIS — E785 Hyperlipidemia, unspecified: Secondary | ICD-10-CM | POA: Diagnosis not present

## 2023-12-05 DIAGNOSIS — I351 Nonrheumatic aortic (valve) insufficiency: Secondary | ICD-10-CM | POA: Diagnosis present

## 2023-12-05 DIAGNOSIS — C50412 Malignant neoplasm of upper-outer quadrant of left female breast: Secondary | ICD-10-CM | POA: Diagnosis not present

## 2023-12-05 DIAGNOSIS — I4891 Unspecified atrial fibrillation: Secondary | ICD-10-CM | POA: Diagnosis not present

## 2023-12-05 DIAGNOSIS — I10 Essential (primary) hypertension: Secondary | ICD-10-CM

## 2023-12-05 DIAGNOSIS — I82462 Acute embolism and thrombosis of left calf muscular vein: Secondary | ICD-10-CM | POA: Diagnosis present

## 2023-12-05 DIAGNOSIS — M79605 Pain in left leg: Secondary | ICD-10-CM | POA: Diagnosis not present

## 2023-12-05 DIAGNOSIS — E871 Hypo-osmolality and hyponatremia: Secondary | ICD-10-CM | POA: Diagnosis present

## 2023-12-05 DIAGNOSIS — N183 Chronic kidney disease, stage 3 unspecified: Secondary | ICD-10-CM | POA: Diagnosis present

## 2023-12-05 DIAGNOSIS — Z87891 Personal history of nicotine dependence: Secondary | ICD-10-CM

## 2023-12-05 DIAGNOSIS — Z85118 Personal history of other malignant neoplasm of bronchus and lung: Secondary | ICD-10-CM

## 2023-12-05 DIAGNOSIS — Z923 Personal history of irradiation: Secondary | ICD-10-CM

## 2023-12-05 DIAGNOSIS — I4819 Other persistent atrial fibrillation: Secondary | ICD-10-CM | POA: Diagnosis present

## 2023-12-05 DIAGNOSIS — R64 Cachexia: Secondary | ICD-10-CM | POA: Diagnosis present

## 2023-12-05 DIAGNOSIS — I82402 Acute embolism and thrombosis of unspecified deep veins of left lower extremity: Secondary | ICD-10-CM | POA: Diagnosis not present

## 2023-12-05 DIAGNOSIS — N1831 Chronic kidney disease, stage 3a: Secondary | ICD-10-CM | POA: Diagnosis present

## 2023-12-05 DIAGNOSIS — I82412 Acute embolism and thrombosis of left femoral vein: Secondary | ICD-10-CM | POA: Diagnosis present

## 2023-12-05 DIAGNOSIS — Z170 Estrogen receptor positive status [ER+]: Secondary | ICD-10-CM

## 2023-12-05 DIAGNOSIS — C50919 Malignant neoplasm of unspecified site of unspecified female breast: Secondary | ICD-10-CM

## 2023-12-05 DIAGNOSIS — F0390 Unspecified dementia without behavioral disturbance: Secondary | ICD-10-CM | POA: Diagnosis present

## 2023-12-05 DIAGNOSIS — I2699 Other pulmonary embolism without acute cor pulmonale: Secondary | ICD-10-CM | POA: Diagnosis present

## 2023-12-05 DIAGNOSIS — G40909 Epilepsy, unspecified, not intractable, without status epilepticus: Secondary | ICD-10-CM

## 2023-12-05 DIAGNOSIS — I824Y2 Acute embolism and thrombosis of unspecified deep veins of left proximal lower extremity: Secondary | ICD-10-CM | POA: Diagnosis not present

## 2023-12-05 DIAGNOSIS — R531 Weakness: Secondary | ICD-10-CM | POA: Diagnosis not present

## 2023-12-05 DIAGNOSIS — I82409 Acute embolism and thrombosis of unspecified deep veins of unspecified lower extremity: Secondary | ICD-10-CM | POA: Diagnosis present

## 2023-12-05 DIAGNOSIS — M1712 Unilateral primary osteoarthritis, left knee: Secondary | ICD-10-CM | POA: Diagnosis not present

## 2023-12-05 DIAGNOSIS — E032 Hypothyroidism due to medicaments and other exogenous substances: Secondary | ICD-10-CM | POA: Diagnosis present

## 2023-12-05 DIAGNOSIS — Z515 Encounter for palliative care: Secondary | ICD-10-CM

## 2023-12-05 DIAGNOSIS — Z7901 Long term (current) use of anticoagulants: Secondary | ICD-10-CM

## 2023-12-05 DIAGNOSIS — Z79899 Other long term (current) drug therapy: Secondary | ICD-10-CM

## 2023-12-05 DIAGNOSIS — Z888 Allergy status to other drugs, medicaments and biological substances status: Secondary | ICD-10-CM

## 2023-12-05 DIAGNOSIS — Z79890 Hormone replacement therapy (postmenopausal): Secondary | ICD-10-CM

## 2023-12-05 DIAGNOSIS — I272 Pulmonary hypertension, unspecified: Secondary | ICD-10-CM | POA: Diagnosis present

## 2023-12-05 DIAGNOSIS — M25462 Effusion, left knee: Secondary | ICD-10-CM | POA: Diagnosis not present

## 2023-12-05 DIAGNOSIS — Z83438 Family history of other disorder of lipoprotein metabolism and other lipidemia: Secondary | ICD-10-CM

## 2023-12-05 DIAGNOSIS — Z823 Family history of stroke: Secondary | ICD-10-CM

## 2023-12-05 DIAGNOSIS — Z7951 Long term (current) use of inhaled steroids: Secondary | ICD-10-CM

## 2023-12-05 DIAGNOSIS — Z9181 History of falling: Secondary | ICD-10-CM

## 2023-12-05 DIAGNOSIS — M25562 Pain in left knee: Secondary | ICD-10-CM | POA: Diagnosis not present

## 2023-12-05 DIAGNOSIS — C787 Secondary malignant neoplasm of liver and intrahepatic bile duct: Secondary | ICD-10-CM | POA: Diagnosis present

## 2023-12-05 DIAGNOSIS — R4182 Altered mental status, unspecified: Secondary | ICD-10-CM | POA: Diagnosis not present

## 2023-12-05 DIAGNOSIS — I517 Cardiomegaly: Secondary | ICD-10-CM | POA: Diagnosis not present

## 2023-12-05 DIAGNOSIS — Z6822 Body mass index (BMI) 22.0-22.9, adult: Secondary | ICD-10-CM

## 2023-12-05 DIAGNOSIS — Z8673 Personal history of transient ischemic attack (TIA), and cerebral infarction without residual deficits: Secondary | ICD-10-CM

## 2023-12-05 DIAGNOSIS — M79662 Pain in left lower leg: Secondary | ICD-10-CM | POA: Diagnosis not present

## 2023-12-05 LAB — COMPREHENSIVE METABOLIC PANEL WITH GFR
ALT: 34 U/L (ref 0–44)
AST: 73 U/L — ABNORMAL HIGH (ref 15–41)
Albumin: 2.7 g/dL — ABNORMAL LOW (ref 3.5–5.0)
Alkaline Phosphatase: 131 U/L — ABNORMAL HIGH (ref 38–126)
Anion gap: 12 (ref 5–15)
BUN: 14 mg/dL (ref 8–23)
CO2: 28 mmol/L (ref 22–32)
Calcium: 9.2 mg/dL (ref 8.9–10.3)
Chloride: 90 mmol/L — ABNORMAL LOW (ref 98–111)
Creatinine, Ser: 1.32 mg/dL — ABNORMAL HIGH (ref 0.44–1.00)
GFR, Estimated: 40 mL/min — ABNORMAL LOW (ref >60)
Glucose, Bld: 100 mg/dL — ABNORMAL HIGH (ref 70–99)
Potassium: 3.5 mmol/L (ref 3.5–5.1)
Sodium: 130 mmol/L — ABNORMAL LOW (ref 135–145)
Total Bilirubin: 1.5 mg/dL — ABNORMAL HIGH (ref 0.0–1.2)
Total Protein: 6.7 g/dL (ref 6.5–8.1)

## 2023-12-05 LAB — I-STAT CHEM 8, ED
BUN: 17 mg/dL (ref 8–23)
Calcium, Ion: 1.02 mmol/L — ABNORMAL LOW (ref 1.15–1.40)
Chloride: 94 mmol/L — ABNORMAL LOW (ref 98–111)
Creatinine, Ser: 1.4 mg/dL — ABNORMAL HIGH (ref 0.44–1.00)
Glucose, Bld: 98 mg/dL (ref 70–99)
HCT: 48 % — ABNORMAL HIGH (ref 36.0–46.0)
Hemoglobin: 16.3 g/dL — ABNORMAL HIGH (ref 12.0–15.0)
Potassium: 3.6 mmol/L (ref 3.5–5.1)
Sodium: 129 mmol/L — ABNORMAL LOW (ref 135–145)
TCO2: 28 mmol/L (ref 22–32)

## 2023-12-05 LAB — CBC WITH DIFFERENTIAL/PLATELET
Abs Immature Granulocytes: 0.06 K/uL (ref 0.00–0.07)
Basophils Absolute: 0 K/uL (ref 0.0–0.1)
Basophils Relative: 1 %
Eosinophils Absolute: 0 K/uL (ref 0.0–0.5)
Eosinophils Relative: 0 %
HCT: 45.4 % (ref 36.0–46.0)
Hemoglobin: 14.9 g/dL (ref 12.0–15.0)
Immature Granulocytes: 1 %
Lymphocytes Relative: 12 %
Lymphs Abs: 0.7 K/uL (ref 0.7–4.0)
MCH: 32.3 pg (ref 26.0–34.0)
MCHC: 32.8 g/dL (ref 30.0–36.0)
MCV: 98.5 fL (ref 80.0–100.0)
Monocytes Absolute: 0.7 K/uL (ref 0.1–1.0)
Monocytes Relative: 13 %
Neutro Abs: 4.1 K/uL (ref 1.7–7.7)
Neutrophils Relative %: 73 %
Platelets: 155 K/uL (ref 150–400)
RBC: 4.61 MIL/uL (ref 3.87–5.11)
RDW: 13.1 % (ref 11.5–15.5)
WBC: 5.5 K/uL (ref 4.0–10.5)
nRBC: 0 % (ref 0.0–0.2)

## 2023-12-05 MED ORDER — HEPARIN SODIUM (PORCINE) 5000 UNIT/ML IJ SOLN: 60.0000 [IU]/kg | Freq: Once | INTRAMUSCULAR | Status: DC

## 2023-12-05 MED ORDER — IOHEXOL 350 MG/ML SOLN
100.0000 mL | Freq: Once | INTRAVENOUS | Status: AC | PRN
  Administered 2023-12-05: 100 mL via INTRAVENOUS

## 2023-12-05 MED ORDER — HEPARIN (PORCINE) 25000 UT/250ML-% IV SOLN
550.0000 [IU]/h | INTRAVENOUS | Status: DC
  Administered 2023-12-05: 750 [IU]/h via INTRAVENOUS
  Administered 2023-12-07: 700 [IU]/h via INTRAVENOUS
  Filled 2023-12-05 (×2): qty 250

## 2023-12-05 NOTE — ED Triage Notes (Signed)
 BIB [facility] from assisted living [facility] for swelling in left leg from hip to foot. Redness/purple, pain, poor cap refill. Good PMS.  10pm LKN. Dementia at baseline.  144/78 HR 80 Afib Hx, 22RR 120 CBG 99RA. 97.5

## 2023-12-05 NOTE — H&P (Signed)
 History and Physical    Patient: Patricia [name] FMW:[id number] DOB: 1940-07-10 DOA: 12/05/2023 DOS: the patient was seen and examined on 12/05/2023 PCP: Gayle Saddie FALCON, PA-C  Patient coming from: SNF  Chief Complaint:  Chief Complaint  Patient presents with   Leg Pain   Leg Swelling   HPI: Patricia [name] is a 83 y.o. female with medical history significant of metastatic breast cancer, history of lung cancer, history of multiple metastasis from the breast cancer, chronic kidney disease stage III, essential hypertension, atrial fibrillation, dementia, GERD, hyperlipidemia, chronic diastolic heart failure, pulmonary hypertension, glaucoma, seizure disorder, who is from [place] brought in secondary to left lower extremity swelling.  Patient is on hospice care at the [facility].  Husband is here with the patient.  She was previously on Eliquis  for atrial fibrillation but was taken off after last hospitalization due to patient's recurrent falls.  Patient was seen in the ER and evaluated.  She currently has significant left lower extremity swelling and pain.  The swelling is all the way from the hip to the legs.  CT venogram and CT angiogram of the chest showed right upper lobe arterial emboli as well as DVT in the left external iliac vein, iliac vein superficial and deep fever events and calf veins.  There is also metastatic bone disease with progression in the lumbar spine.  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  There is also another large tissue mass in the left pelvic sidewall suspected to be uroepithelial neoplasm or metastatic disease.  IR was consulted regarding patient's DVT.  Recommend started heparin  and they will discuss with the husband regarding embolectomy in the morning.  Review of Systems: As mentioned in the history of present illness. All other systems reviewed and are negative. 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 Dx 04/2015 at time of stroke. b. DCCV 06/2015 - did not hold. Amio started then stopped due to QT prolongation.   Chronic diastolic heart failure (HCC) 06/17/2015   CKD (chronic kidney disease), stage III (HCC)    Cough variant asthma    Dementia (HCC)    Diverticulosis    Essential hypertension    GERD (gastroesophageal reflux disease)    Glaucoma    Hiatal hernia    Hyperlipidemia    Hypothyroidism    Internal hemorrhoids    Laryngospasm    Mitral regurgitation    mild to moderate by echo 12/2019 and moderate by TEE 12/2020   Multinodular goiter    Osteopenia    Postmenopausal    Pulmonary HTN (HCC)     Moderate with PASP by echo 2018 likely Group 2 from pulmonary venous HTN from CHF>>normal PAP by echo 12/2019   Radiation 10/22/14-11/23/14   Left Breast   Stricture and stenosis of cervix    Stroke (HCC)    TIAs   Past Surgical History:  Procedure Laterality Date   BREAST LUMPECTOMY Left    BREAST SURGERY     CARDIOVERSION N/A 06/24/2015   Procedure: CARDIOVERSION;  Surgeon: Wilbert JONELLE Bihari, MD;  Location: [facility];  Service: Cardiovascular;  Laterality: N/A;   CARDIOVERSION N/A 05/19/2016   Procedure: CARDIOVERSION;  Surgeon: Wilbert JONELLE Bihari, MD;  Location:  [facility];  Service: Cardiovascular;  Laterality: N/A;   COLONOSCOPY     EYE SURGERY Bilateral    cataract removal by Dr. Patrcia   FEMUR IM NAIL Left 07/28/2022   Procedure: INTRAMEDULLARY (IM) NAIL FEMORAL;  Surgeon: Sheril Coy, MD;  Location: [facility];  Service: Orthopedics;  Laterality: Left;   IR IMAGING GUIDED PORT INSERTION  10/30/2022   IR THORACENTESIS ASP PLEURAL SPACE W/IMG GUIDE  05/10/2023   LUNG CANCER SURGERY  2003   resection carcinoid lingula-lt upper lobe   RADIOACTIVE SEED GUIDED PARTIAL MASTECTOMY WITH AXILLARY SENTINEL LYMPH NODE BIOPSY Left  06/26/2014   Procedure: RADIOACTIVE SEED GUIDED PARTIAL MASTECTOMY WITH AXILLARY SENTINEL LYMPH NODE BIOPSY;  Surgeon: Jina Nephew, MD;  Location: [facility];  Service: General;  Laterality: Left;   RE-EXCISION OF BREAST LUMPECTOMY Left 08/07/2014   Procedure: RE-EXCISION OF LEFT BREAST LUMPECTOMY;  Surgeon: Jina Nephew, MD;  Location: [facility];  Service: General;  Laterality: Left;   RIGHT HEART CATH N/A 04/27/2016   Procedure: Right Heart Cath;  Surgeon: Ezra GORMAN Shuck, MD;  Location: [facility] INVASIVE CV LAB;  Service: Cardiovascular;  Laterality: N/A;   TEE WITHOUT CARDIOVERSION N/A 01/07/2021   Procedure: TRANSESOPHAGEAL ECHOCARDIOGRAM (TEE);  Surgeon: Alveta Aleene PARAS, MD;  Location[facility]COPY;  Service: Cardiovascular;  Laterality: N/A;   Social History:  reports that she quit smoking about 57 years ago. Her smoking use included cigarettes. She started smoking about 62 years ago. She has a 3.8 pack-year smoking history. She has been exposed to tobacco smoke. She has never used smokeless tobacco. She reports that she does not currently use alcohol . She reports that she does not use drugs.  Allergies  Allergen Reactions   Combigan [Brimonidine Tartrate-Timolol ] Itching    Per patient made eyes red, sore, and sensitivity to light   Sulfa Antibiotics Hives and Rash   Sulfamethoxazole-Trimethoprim Hives    Family History  Problem Relation Age of Onset   Stroke Mother    Hypertension Mother    Hyperlipidemia Mother    Stroke Father    Transient ischemic attack Father    Hyperlipidemia Father    Hypertension Father    Atrial fibrillation Son    Heart attack Neg Hx     Prior to Admission medications   Medication Sig Start Date End Date Taking? Authorizing Provider  acetaminophen  (TYLENOL ) 325 MG tablet Take 2 tablets (650 mg total) by mouth every 6 (six) hours as needed for mild pain (pain score 1-3) or fever (or Fever >/= 101). 09/09/23   Will Almarie MATSU, MD  albuterol  (PROVENTIL  HFA;VENTOLIN  HFA) [phone] Base) MCG/ACT inhaler Inhale 2 puffs into the lungs daily as needed for wheezing or shortness of breath.    [provider]  apixaban  (ELIQUIS ) 2.5 MG TABS tablet Take 1 tablet (2.5 mg total) by mouth 2 (two) times daily. 07/28/23   Turner, Wilbert SAUNDERS, MD  bimatoprost (LUMIGAN) 0.01 % SOLN Place 1 drop into both eyes 2 (two) times daily.    [provider]  digoxin  (LANOXIN ) 0.125 MG tablet Take 1 tablet (0.125 mg total) by mouth daily. 09/04/22   Lucien Orren SAILOR, PA-C  donepezil  (ARICEPT ) 5 MG tablet Take 1 tablet (5 mg total) by mouth at bedtime. 04/19/23   Whitfield Raisin, NP  dorzolamide -timolol  (COSOPT ) 22.3-6.8 MG/ML ophthalmic solution Place 1 drop into both eyes at bedtime.    [provider]  fluticasone  (FLONASE ) 50 MCG/ACT nasal spray 1 spray each  nostril following sinus rinses twice daily 02/08/18   Midge Sober, DO  fluticasone -salmeterol (ADVAIR  HFA) 230-21 MCG/ACT inhaler USE 2 INHALATIONS TWICE A DAY 06/25/22   Wallace Search A, PA  furosemide  (LASIX ) 20 MG tablet TAKE 1 TABLET TWICE A DAY. TAKE ADDITIONAL 1 TABLET AS NEEDED FOR SWELLING Patient taking differently: Take 20 mg by mouth 2 (two) times daily. 05/07/23   Shlomo Wilbert SAUNDERS, MD  glycopyrrolate  (ROBINUL ) 1 MG tablet Take 1 tablet (1 mg total) by mouth every 4 (four) hours as needed (excessive secretions). 09/09/23   Will Almarie MATSU, MD  haloperidol  (HALDOL ) 0.5 MG tablet Take 1 tablet (0.5 mg total) by mouth every 4 (four) hours as needed for agitation (or delirium). 09/09/23   Will Almarie MATSU, MD  levETIRAcetam  (KEPPRA  XR) 500 MG 24 hr tablet TAKE 1 TABLET AT BEDTIME 09/07/22   Whitfield Raisin, NP  loratadine  (CLARITIN ) 10 MG tablet Take 10 mg by mouth daily.    [provider]  Netarsudil  Dimesylate (RHOPRESSA) 0.02 % SOLN Place 1 drop into both eyes at bedtime.    [provider]  ondansetron  (ZOFRAN ) 4 MG tablet Take 1  tablet (4 mg total) by mouth every 6 (six) hours as needed for nausea. 09/09/23   Will Almarie MATSU, MD  potassium chloride  SA (KLOR-CON  M) 20 MEQ tablet Take 1 tablet (20 mEq total) by mouth daily. 01/15/23   Lucien Orren SAILOR, PA-C  senna-docusate (SENOKOT-S) 8.6-50 MG tablet Take 1 tablet by mouth at bedtime as needed for mild constipation. 09/09/23   Will Almarie MATSU, MD  thyroid  (ARMOUR THYROID ) 90 MG tablet Take 1 tablet (90 mg total) by mouth daily. 08/07/23   Gayle Saddie FALCON, PA-C  traMADol  (ULTRAM ) 50 MG tablet Take 1 tablet (50 mg total) by mouth every 6 (six) hours as needed for moderate pain (pain score 4-6) or severe pain (pain score 7-10). 09/09/23   Will Almarie MATSU, MD    Physical Exam: Vitals:   12/05/23 1848 12/05/23 1900 12/05/23 2130 12/05/23 2236  BP:  103/85  104/70  Pulse:  69 77 91  Resp:  (!) 23 20 (!) 24  Temp: 97.7 F (36.5 C)   97.8 F (36.6 C)  TempSrc: Oral     SpO2:  100% 98% 96%   Constitutional: Chronic ill looking, NAD, calm, comfortable Eyes: PERRL, lids and conjunctivae normal ENMT: Mucous membranes are dry. Posterior pharynx clear of any exudate or lesions.Normal dentition.  Neck: normal, supple, no masses, no thyromegaly Respiratory: Decreased air entry with some crackles,. Normal respiratory effort. No accessory muscle use.  Cardiovascular: Regular rate and rhythm, no murmurs / rubs / gallops. No extremity edema. 2+ pedal pulses. No carotid bruits.  Abdomen: Diffuse tenderness with mass palpable in the left lower quadrant, palpated. No hepatosplenomegaly. Bowel sounds positive.  Musculoskeletal: Left lower extremity is swollen tender, great, Skin: no rashes, lesions, ulcers. No induration Neurologic: CN 2-12 grossly intact. Sensation intact, DTR normal. Strength 5/5 in all 4.  Psychiatric: Awake and alert, confused but fully awake  Data Reviewed:  Respirate 24,sodium 129 chloride 94, creatinine 1.4 alkaline phos 131 albumin  2.7 AST 73.  Total  bilirubin 1.5 CBC is stable x-ray of the knee showed unremarkable partially visible femoral into medullary rod with small spur patella bursal effusion osteopenia CT angiogram of the chest pulmonary and CT venogram shows 2 small vessel right upper lobe arterial emboli.  Also moderate left and small right pleural effusions right lower lobe interstitial changes which are  chronic, stable cardiomegaly, new liver metastasis, new left-sided obstructive uropathy, 5 mm filling defect in the mid left ureter which could be neoplasm, also an abnormal ill-defined soft tissue thickening around the aortic bifurcation continuing along the left pelvic sidewall down to and abnormal new thickening and enhancement on the left side of the bladder.  The left ureter is engulfed within this.  Also finding of possible urothelial neoplasm or metastatic disease to the left pelvic wall and bladder.  Patient also has another heterogeneous enhancement and symmetric enlargement of the left iliopsoas muscle which could indicate tumor involvement or infectious complication as well as DVT in the left external iliac vein, iliac vein superficial and deep femoral veins and visualized calf veins.  Assessment and Plan:  #1 extensive DVT of the left lower extremity: Patient is not a perfect candidate for anticoagulation due to previous falls.  Patient has been evaluated.  IR will evaluate patient in the morning and discussed with the husband if they want thrombectomy.  We will initiate heparin  drip until then.  Pain control and supportive care.  #2 metastatic breast cancer: Extensive and advanced.  No active treatment.  Patient on hospice care.  Will continue to monitor.  #3 hyponatremia: Will hydrate with fluid.  #4 chronic atrial fibrillation: Continue rate control.  #5 GERD: Continue PPIs  #6 hypothyroidism: Patient on levothyroxine .  Will continue  #7 chronic diastolic heart failure: Compensated.  Continue to monitor  #8 essential  hypertension: Confirm on resume home regimen  #9 chronic kidney disease stage IIIa: At baseline.  #10 history of CVA: On hospice care now  #11 history of seizure disorder: On Keppra   #12 pulmonary hypertension: Continue oxygen     Advance Care Planning:   Code Status: Do not attempt resuscitation (DNR) - Comfort care   Consults: Interventional radiology  Family Communication: Husband at bedside  Severity of Illness: The appropriate patient status for this patient is INPATIENT. Inpatient status is judged to be reasonable and necessary in order to provide the required intensity of service to ensure the patient's safety. The patient's presenting symptoms, physical exam findings, and initial radiographic and laboratory data in the context of their chronic comorbidities is felt to place them at high risk for further clinical deterioration. Furthermore, it is not anticipated that the patient will be medically stable for discharge from the hospital within 2 midnights of admission.   * I certify that at the point of admission it is my clinical judgment that the patient will require inpatient hospital care spanning beyond 2 midnights from the point of admission due to high intensity of service, high risk for further deterioration and high frequency of surveillance required.*  AuthorBETHA SIM KNOLL, MD 12/05/2023 11:29 PM  For on call review [email or web address].

## 2023-12-05 NOTE — ED Provider Notes (Signed)
 [place] EMERGENCY DEPARTMENT AT [facility] Provider Note   CSN: [id number] Arrival date & time: 12/05/23  1806     Patient presents with: Leg Pain and Leg Swelling   Patricia [name] is a 83 y.o. female.   83 yo F with a hx of metastatic breast cancer that is stage IV, AF on eliquis , CHF, dementia, CVA, and asthma who presents with LLE pain and swelling.  History is somewhat limited due to her baseline mental status.  Per her son started having left lower extremity swelling today.  Does have a femur rod for a tumor that was in that leg. Currently is on hospice.  Patient reports that her pain is mild at this point in time.  EMS noted some blue discoloration of her toes.  She denies any chest pain or shortness of breath to me. Per husband it is unclear if hospice has kept her on eliquis .         Prior to Admission medications   Medication Sig Start Date End Date Taking? Authorizing Provider  acetaminophen  (TYLENOL ) 325 MG tablet Take 2 tablets (650 mg total) by mouth every 6 (six) hours as needed for mild pain (pain score 1-3) or fever (or Fever >/= 101). 09/09/23   Will Almarie MATSU, MD  albuterol  (PROVENTIL  HFA;VENTOLIN  HFA) 108 (90 Base) MCG/ACT inhaler Inhale 2 puffs into the lungs daily as needed for wheezing or shortness of breath.    [provider]  apixaban  (ELIQUIS ) 2.5 MG TABS tablet Take 1 tablet (2.5 mg total) by mouth 2 (two) times daily. 07/28/23   Turner, Wilbert SAUNDERS, MD  bimatoprost (LUMIGAN) 0.01 % SOLN Place 1 drop into both eyes 2 (two) times daily.    [provider]  digoxin  (LANOXIN ) 0.125 MG tablet Take 1 tablet (0.125 mg total) by mouth daily. 09/04/22   Lucien Orren SAILOR, PA-C  donepezil  (ARICEPT ) 5 MG tablet Take 1 tablet (5 mg total) by mouth at bedtime. 04/19/23   Whitfield Raisin, NP  dorzolamide -timolol  (COSOPT ) 22.3-6.8 MG/ML ophthalmic solution Place 1 drop into both eyes at bedtime.    [provider]  fluticasone  (FLONASE ) 50  MCG/ACT nasal spray 1 spray each nostril following sinus rinses twice daily 02/08/18   Opalski, Deborah, DO  fluticasone -salmeterol (ADVAIR  HFA) 230-21 MCG/ACT inhaler USE 2 INHALATIONS TWICE A DAY 06/25/22   Wallace Search A, PA  furosemide  (LASIX ) 20 MG tablet TAKE 1 TABLET TWICE A DAY. TAKE ADDITIONAL 1 TABLET AS NEEDED FOR SWELLING Patient taking differently: Take 20 mg by mouth 2 (two) times daily. 05/07/23   Shlomo Wilbert SAUNDERS, MD  glycopyrrolate  (ROBINUL ) 1 MG tablet Take 1 tablet (1 mg total) by mouth every 4 (four) hours as needed (excessive secretions). 09/09/23   Will Almarie MATSU, MD  haloperidol  (HALDOL ) 0.5 MG tablet Take 1 tablet (0.5 mg total) by mouth every 4 (four) hours as needed for agitation (or delirium). 09/09/23   Will Almarie MATSU, MD  levETIRAcetam  (KEPPRA  XR) 500 MG 24 hr tablet TAKE 1 TABLET AT BEDTIME 09/07/22   Whitfield Raisin, NP  loratadine  (CLARITIN ) 10 MG tablet Take 10 mg by mouth daily.    [provider]  Netarsudil  Dimesylate (RHOPRESSA) 0.02 % SOLN Place 1 drop into both eyes at bedtime.    [provider]  ondansetron  (ZOFRAN ) 4 MG tablet Take 1 tablet (4 mg total) by mouth every 6 (six) hours as needed for nausea. 09/09/23   Will Almarie MATSU, MD  potassium chloride  SA (KLOR-CON   M) 20 MEQ tablet Take 1 tablet (20 mEq total) by mouth daily. 01/15/23   Lucien Orren SAILOR, PA-C  senna-docusate (SENOKOT-S) 8.6-50 MG tablet Take 1 tablet by mouth at bedtime as needed for mild constipation. 09/09/23   Will Almarie MATSU, MD  thyroid  (ARMOUR THYROID ) 90 MG tablet Take 1 tablet (90 mg total) by mouth daily. 08/07/23   Gayle Saddie FALCON, PA-C  traMADol  (ULTRAM ) 50 MG tablet Take 1 tablet (50 mg total) by mouth every 6 (six) hours as needed for moderate pain (pain score 4-6) or severe pain (pain score 7-10). 09/09/23   Will Almarie MATSU, MD    Allergies: Combigan [brimonidine tartrate-timolol ], Sulfa antibiotics, and Sulfamethoxazole-trimethoprim    Review of  Systems  Updated Vital Signs BP 103/85   Pulse 69   Temp 97.7 F (36.5 C) (Oral)   Resp (!) 23   SpO2 100%   Physical Exam Constitutional:      Appearance: Normal appearance.  Cardiovascular:     Rate and Rhythm: Normal rate and regular rhythm.     Pulses: Normal pulses.     Heart sounds: Normal heart sounds.  Pulmonary:     Effort: Pulmonary effort is normal.     Breath sounds: Normal breath sounds.  Musculoskeletal:     Comments: 3+ edema of left lower extremity.  No edema right lower extremity.  2+ DP pulse in left lower extremity.  Toes do appear to have bluish discoloration but cap refill is less than 2 seconds in all toes of the left foot.  Intact sensation to light touch in all dermatomes of the left foot.  Patient still able to wiggle toes and dorsiflex and plantarflex ankle on the left foot  Neurological:     Mental Status: She is alert.     (all labs ordered are listed, but only abnormal results are displayed) Labs Reviewed  COMPREHENSIVE METABOLIC PANEL WITH GFR - Abnormal; Notable for the following components:      Result Value   Sodium 130 (*)    Chloride 90 (*)    Glucose, Bld 100 (*)    Creatinine, Ser 1.32 (*)    Albumin  2.7 (*)    AST 73 (*)    Alkaline Phosphatase 131 (*)    Total Bilirubin 1.5 (*)    GFR, Estimated 40 (*)    All other components within normal limits  I-STAT CHEM 8, ED - Abnormal; Notable for the following components:   Sodium 129 (*)    Chloride 94 (*)    Creatinine, Ser 1.40 (*)    Calcium , Ion 1.02 (*)    Hemoglobin 16.3 (*)    HCT 48.0 (*)    All other components within normal limits  CBC WITH DIFFERENTIAL/PLATELET  HEPARIN  LEVEL (UNFRACTIONATED)  CBC  APTT    EKG: None  Radiology: DG Knee Complete 4 Views Left Result Date: 12/05/2023 CLINICAL DATA:  Left knee pain and swelling. EXAM: LEFT KNEE - COMPLETE 4+ VIEW COMPARISON:  None Available. FINDINGS: Four views. There is a small suprapatellar bursal effusion. Mild  stranding edema in the visualized left lower extremity. There is a partially visible femoral intramedullary rod. The visualized rod shows no evidence of loosening but there are multiple scattered shallow cortical defects in the visualized mid to distal left femoral shaft concerning for osteomyelitis. There is no evidence of fractures. There is meniscal chondrocalcinosis. Mild narrowing and spurring of the femorotibial joint is noted, more advanced joint space loss and osteophytosis of the patellofemoral  joint. There is no evidence of erosive arthropathy.  There is osteopenia. IMPRESSION: 1. Unremarkable partially visible femoral intramedullary rod, but there are multiple scattered shallow cortical defects in the visualized mid to distal left femoral shaft worrisome for osteomyelitis. 2. Small suprapatellar bursal effusion. 3. Osteopenia and degenerative change. 4. Meniscal chondrocalcinosis. 5. Mild stranding edema in the visualized left lower extremity. Electronically Signed   By: Francis Quam M.D.   On: 12/05/2023 20:11     .Ultrasound ED Peripheral IV (Provider)  Date/Time: 12/05/2023 7:58 PM  Performed by: Yolande Lamar BROCKS, MD Authorized by: Yolande Lamar BROCKS, MD   Procedure details:    Indications: multiple failed IV attempts     Skin Prep: chlorhexidine  gluconate     Location:  Right AC   Angiocath:  18 G   Bedside Ultrasound Guided: Yes     Images: not archived     Patient tolerated procedure without complications: Yes     Dressing applied: Yes     EMERGENCY DEPARTMENT US  EXTREMITY EXAM Study:  Limited Duplex of left lower Extremity Veins  INDICATIONS: Leg swelling Visualization of  regions in transverse plane with full compression visualized.   PERFORMED BY: Myself IMAGES ARCHIVED?: Yes VIEWS USED: Saphenous-femoral junction, Proximal femoral vein, and Popliteal vein INTERPRETATION: DVT visualized     Medications Ordered in the ED  heparin  ADULT infusion 100 units/mL  (25000 units/250mL) (750 Units/hr Intravenous New Bag/Given 12/05/23 2030)  iohexol  (OMNIPAQUE ) 350 MG/ML injection 100 mL (100 mLs Intravenous Contrast Given 12/05/23 2133)    Clinical Course as of 12/05/23 2201  Sun Dec 05, 2023  1907 Discussed with Dr Pearline from vascular surgery.  Feels that patient could be a candidate for palliative thrombectomy [RP]  2156 Signed out to Dr Dean [RP]    Clinical Course User Index [RP] Yolande Lamar BROCKS, MD                                 Medical Decision Making Amount and/or Complexity of Data Reviewed Labs: ordered. Radiology: ordered.  Risk Prescription drug management.   83 yo F with a hx of metastatic breast cancer that is stage IV, AF on eliquis , CHF, dementia, CVA, and asthma who presents with LLE pain and swelling.   Initial Ddx:  DVT, phlegmasia cerulea dolens, arterial insufficiency, fracture, malignancy  MDM/Course:  Patient resents emergency department swelling of her left lower extremity.  Does have significant swelling.  Bedside ultrasound shows that she likely has a DVT.  Unfortunately we do not have vascular lab at this point in time.  Did discuss with vascular surgery since she is on hospice who feels that she could still potentially be a palliative thrombectomy candidate.  Recommend starting her on heparin .  CT venogram was ordered to evaluate for proximal DVT.  Also ordered a CTA of the chest to see if she has any evidence of PE.  Upon re-evaluation pain was under control.  Signed out to the oncoming physician awaiting the results of her CT scan.  Of note the x-rays of the lower extremity did show possible osteomyelitis but with her history of cancer in that area could potentially reflect some changes because of that.  Will hold off on treating her for osteo until we get the results of the CT scan.  This patient presents to the ED for concern of complaints listed in HPI, this involves an extensive number of treatment options,  and is a complaint that carries with it a high risk of complications and morbidity. Disposition including potential need for admission considered.   Dispo: Pending remainder of workup  Additional history obtained from spouse Records reviewed Outpatient Clinic Notes The following labs were independently interpreted: Chemistry and show AKI I independently reviewed the following imaging with scope of interpretation limited to determining acute life threatening conditions related to emergency care: CT Abdomen/Pelvis and CT Extremity and agree with the radiologist interpretation with the following exceptions: none I personally reviewed and interpreted cardiac monitoring: normal sinus rhythm  I personally reviewed and interpreted the pt's EKG: see above for interpretation  I have reviewed the patients home medications and made adjustments as needed Consults: Vascular Surgery Social Determinants of health:  Geriatric  Portions of this note were generated with Scientist, clinical (histocompatibility and immunogenetics). Dictation errors may occur despite best attempts at proofreading.     Final diagnoses:  Acute deep vein thrombosis (DVT) of proximal vein of left lower extremity (HCC)  Stage IV breast cancer in female [facility])    ED Discharge Orders     None          Yolande Lamar BROCKS, MD 12/05/23 2201

## 2023-12-05 NOTE — ED Provider Notes (Signed)
 Pt signed out by Dr. Yolande pending CT readings.  CT venogram and CT chest:  1. 2 small-vessel right upper lobe arterial emboli. No other embolus  is seen. No findings of acute right heart strain.  2. Moderate left and small right pleural effusions, unchanged.  3. Right lower lobe interstitial changes which be chronic or due to  mild edema.  4. Stable cardiomegaly with biatrial chamber predominance. Query  chronic right heart dysfunction.  5. Innumerable new liver metastases.  6. New left-sided obstructive uropathy.  7. There appears to be a 5 mm filling defect in the mid left ureter  which could be a sloughed papilla or neoplasm.  8. Below this level, new abnormal ill-defined soft tissue thickening  around the aortic bifurcation continuing along the left pelvic  sidewall down to an abnormal new thickening and enhancement of the  left side of the bladder. The left ureter is probably engulfed  within this. The proximal internal and external iliac veins are  likely also encased. Findings could be due to new urothelial  neoplasm or metastatic disease to the left pelvic wall and bladder.  9. Further abnormal heterogeneous enhancement and asymmetric  enlargement of the left iliopsoas muscle, which could indicate tumor  involvement or infectious complication.  10. DVT in the left external iliac vein, common iliac vein,  superficial and deep femoral veins and visualized calf veins.  11. Metastatic bone disease with progression in the lumbar spine  since 08/22/2023.  12. Body wall and left lower extremity edema, mild mesenteric  congestive changes, and mild ascites.    Results discussed with pt's husband.  Pt d/w Dr. Sim for admission.   Dean Clarity, MD 12/05/23 2310

## 2023-12-05 NOTE — Progress Notes (Addendum)
 PHARMACY - ANTICOAGULATION CONSULT NOTE  Pharmacy Consult for heparin  Indication: VTE treatment  Allergies  Allergen Reactions   Combigan [Brimonidine Tartrate-Timolol ] Itching    Per patient made eyes red, sore, and sensitivity to light   Sulfa Antibiotics Hives and Rash   Sulfamethoxazole-Trimethoprim Hives    Patient Measurements:    Vital Signs: Temp: 97.7 F (36.5 C) (10/05 1848) Temp Source: Oral (10/05 1848) BP: 122/84 (10/05 1837) Pulse Rate: 77 (10/05 1842)  Labs: No results for input(s): HGB, HCT, PLT, APTT, LABPROT, INR, HEPARINUNFRC, HEPRLOWMOCWT, CREATININE, CKTOTAL, CKMB, TROPONINIHS in the last 72 hours.  CrCl cannot be calculated (Patient's most recent lab result is older than the maximum 21 days allowed.).   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 Dx 04/2015 at time of stroke. b. DCCV 06/2015 - did not hold. Amio started then stopped due to QT prolongation.   Chronic diastolic heart failure (HCC) 06/17/2015   CKD (chronic kidney disease), stage III (HCC)    Cough variant asthma    Dementia (HCC)    Diverticulosis    Essential hypertension    GERD (gastroesophageal reflux disease)    Glaucoma    Hiatal hernia    Hyperlipidemia    Hypothyroidism    Internal hemorrhoids    Laryngospasm    Mitral regurgitation    mild to moderate by echo 12/2019 and moderate by TEE 12/2020   Multinodular goiter    Osteopenia    Postmenopausal    Pulmonary HTN (HCC)     Moderate with PASP by echo 2018 likely Group 2 from pulmonary venous HTN from CHF>>normal PAP by echo 12/2019   Radiation 10/22/14-11/23/14   Left Breast   Stricture and stenosis of cervix    Stroke [facility])    TIAs    Assessment: 65 YOF presenting with leg  pain/swelling, acute DVT, hx of afib and was on Eliquis  however unclear if this was continued, will obtain baseline Anti-Xa level to determine  Goal of Therapy:  Heparin  level 0.3-0.7 units/ml aPTT 66-102 seconds Monitor platelets by anticoagulation protocol: Yes   Plan:  Heparin  gtt at 750 units/hr F/u 8 hour aPTT/HL F/u VVS recs and plan  Addendum: Despite full tube sample sent by RN lab reports insufficient, baseline Anti-Xa level now unavailable  Dorn Poot, PharmD, [facility] Clinical Pharmacist ED Pharmacist Phone # [phone] 12/05/2023 7:50 PM

## 2023-12-06 ENCOUNTER — Inpatient Hospital Stay (HOSPITAL_COMMUNITY)

## 2023-12-06 ENCOUNTER — Encounter (HOSPITAL_COMMUNITY): Payer: Self-pay | Admitting: Internal Medicine

## 2023-12-06 ENCOUNTER — Other Ambulatory Visit: Payer: Self-pay

## 2023-12-06 DIAGNOSIS — M79662 Pain in left lower leg: Secondary | ICD-10-CM | POA: Diagnosis not present

## 2023-12-06 DIAGNOSIS — Z66 Do not resuscitate: Secondary | ICD-10-CM

## 2023-12-06 DIAGNOSIS — C50919 Malignant neoplasm of unspecified site of unspecified female breast: Secondary | ICD-10-CM

## 2023-12-06 DIAGNOSIS — C7951 Secondary malignant neoplasm of bone: Secondary | ICD-10-CM

## 2023-12-06 DIAGNOSIS — I82422 Acute embolism and thrombosis of left iliac vein: Principal | ICD-10-CM

## 2023-12-06 DIAGNOSIS — C50412 Malignant neoplasm of upper-outer quadrant of left female breast: Secondary | ICD-10-CM

## 2023-12-06 DIAGNOSIS — Z170 Estrogen receptor positive status [ER+]: Secondary | ICD-10-CM

## 2023-12-06 LAB — HEPARIN LEVEL (UNFRACTIONATED)
Heparin Unfractionated: 0.39 [IU]/mL (ref 0.30–0.70)
Heparin Unfractionated: 0.65 [IU]/mL (ref 0.30–0.70)

## 2023-12-06 LAB — CBC
HCT: 42.8 % (ref 36.0–46.0)
HCT: 40.8 % (ref 36.0–46.0)
Hemoglobin: 14.2 g/dL (ref 12.0–15.0)
Hemoglobin: 13.6 g/dL (ref 12.0–15.0)
MCH: 32.7 pg (ref 26.0–34.0)
MCH: 32.2 pg (ref 26.0–34.0)
MCHC: 33.2 g/dL (ref 30.0–36.0)
MCHC: 33.3 g/dL (ref 30.0–36.0)
MCV: 98.6 fL (ref 80.0–100.0)
MCV: 96.5 fL (ref 80.0–100.0)
Platelets: 145 K/uL — ABNORMAL LOW (ref 150–400)
Platelets: 154 K/uL (ref 150–400)
RBC: 4.34 MIL/uL (ref 3.87–5.11)
RBC: 4.23 MIL/uL (ref 3.87–5.11)
RDW: 13 % (ref 11.5–15.5)
RDW: 13 % (ref 11.5–15.5)
WBC: 6.1 K/uL (ref 4.0–10.5)
WBC: 5.8 K/uL (ref 4.0–10.5)
nRBC: 0 % (ref 0.0–0.2)
nRBC: 0 % (ref 0.0–0.2)

## 2023-12-06 LAB — APTT: aPTT: 126 s — ABNORMAL HIGH (ref 24–36)

## 2023-12-06 LAB — COMPREHENSIVE METABOLIC PANEL WITH GFR
ALT: 33 U/L (ref 0–44)
AST: 68 U/L — ABNORMAL HIGH (ref 15–41)
Albumin: 2.4 g/dL — ABNORMAL LOW (ref 3.5–5.0)
Alkaline Phosphatase: 114 U/L (ref 38–126)
Anion gap: 11 (ref 5–15)
BUN: 16 mg/dL (ref 8–23)
CO2: 27 mmol/L (ref 22–32)
Calcium: 9.4 mg/dL (ref 8.9–10.3)
Chloride: 94 mmol/L — ABNORMAL LOW (ref 98–111)
Creatinine, Ser: 1.44 mg/dL — ABNORMAL HIGH (ref 0.44–1.00)
GFR, Estimated: 36 mL/min — ABNORMAL LOW (ref >60)
Glucose, Bld: 144 mg/dL — ABNORMAL HIGH (ref 70–99)
Potassium: 3.3 mmol/L — ABNORMAL LOW (ref 3.5–5.1)
Sodium: 132 mmol/L — ABNORMAL LOW (ref 135–145)
Total Bilirubin: 1.3 mg/dL — ABNORMAL HIGH (ref 0.0–1.2)
Total Protein: 5.8 g/dL — ABNORMAL LOW (ref 6.5–8.1)

## 2023-12-06 MED ORDER — TRAMADOL HCL 50 MG PO TABS
50.0000 mg | ORAL_TABLET | Freq: Four times a day (QID) | ORAL | Status: DC | PRN
  Administered 2023-12-07: 50 mg via ORAL
  Filled 2023-12-06: qty 1

## 2023-12-06 MED ORDER — GLYCOPYRROLATE 1 MG PO TABS: 1.0000 mg | ORAL_TABLET | ORAL | Status: DC | PRN

## 2023-12-06 MED ORDER — LEVETIRACETAM ER 500 MG PO TB24
500.0000 mg | ORAL_TABLET | Freq: Every day | ORAL | Status: DC
  Administered 2023-12-06: 500 mg via ORAL
  Filled 2023-12-06 (×3): qty 1

## 2023-12-06 MED ORDER — ONDANSETRON HCL 4 MG PO TABS: 4.0000 mg | ORAL_TABLET | Freq: Four times a day (QID) | ORAL | Status: DC | PRN

## 2023-12-06 MED ORDER — ONDANSETRON HCL 4 MG/2ML IJ SOLN
4.0000 mg | Freq: Four times a day (QID) | INTRAMUSCULAR | Status: DC | PRN
  Administered 2023-12-07: 4 mg via INTRAVENOUS
  Filled 2023-12-06: qty 2

## 2023-12-06 MED ORDER — MORPHINE SULFATE (PF) 2 MG/ML IV SOLN
2.0000 mg | INTRAVENOUS | Status: DC | PRN
  Administered 2023-12-07 (×3): 2 mg via INTRAVENOUS
  Filled 2023-12-06 (×3): qty 1

## 2023-12-06 MED ORDER — THYROID 30 MG PO TABS
90.0000 mg | ORAL_TABLET | Freq: Every day | ORAL | Status: DC
  Filled 2023-12-06: qty 1

## 2023-12-06 MED ORDER — DEXTROSE IN LACTATED RINGERS 5 % IV SOLN: INTRAVENOUS | Status: DC

## 2023-12-06 MED ORDER — POTASSIUM CHLORIDE CRYS ER 20 MEQ PO TBCR
20.0000 meq | EXTENDED_RELEASE_TABLET | Freq: Every day | ORAL | Status: DC
  Administered 2023-12-06: 20 meq via ORAL
  Filled 2023-12-06 (×2): qty 1

## 2023-12-06 MED ORDER — TRAMADOL HCL 50 MG PO TABS
50.0000 mg | ORAL_TABLET | Freq: Four times a day (QID) | ORAL | Status: DC | PRN
  Administered 2023-12-06: 50 mg via ORAL
  Filled 2023-12-06: qty 1

## 2023-12-06 NOTE — Plan of Care (Addendum)
 Patient admitted on 2 [address]. Patient calm, cooperative and forgetful at times. Large wound on left anterior left leg, patient states she has not fallen and does not know where wound came from. Foam placed on left leg wound. Patient intermittently confused. Scab on left arm. Patient left with call bell in reach, side rails up and bed in lowest position.   Problem: Education: Goal: Knowledge of General Education information will improve Description: Including pain rating scale, medication(s)/side effects and non-pharmacologic comfort measures Outcome: Progressing   Problem: Health Behavior/Discharge Planning: Goal: Ability to manage health-related needs will improve Outcome: Progressing   Problem: Clinical Measurements: Goal: Ability to maintain clinical measurements within normal limits will improve Outcome: Progressing   Problem: Activity: Goal: Risk for activity intolerance will decrease Outcome: Progressing   Problem: Nutrition: Goal: Adequate nutrition will be maintained Outcome: Progressing   Problem: Coping: Goal: Level of anxiety will decrease Outcome: Progressing   Problem: Elimination: Goal: Will not experience complications related to bowel motility Outcome: Progressing   Problem: Pain Managment: Goal: General experience of comfort will improve and/or be controlled Outcome: Progressing   Problem: Safety: Goal: Ability to remain free from injury will improve Outcome: Progressing

## 2023-12-06 NOTE — Progress Notes (Signed)
 Transition of Care [facility]) - Inpatient Brief Assessment   Patient Details  Name: Patricia [name] MRN: [id number] Date of Birth: [birth year]  Transition of Care [facility]) CM/SW Contact:    Rosaline JONELLE Joe, RN Phone Number: 12/06/2023, 4:39 PM   Clinical Narrative: CM met with the patient and spouse at the bedside to discuss IP Care management needs.  Patient admitted to the hospital from [facility] with active services for Home Hospice through [facility].  DME at the ALF includes hospital bed, WC, RW and bedside table.  Patient is currently waiting from radiology to determine if thrombectomy is needed per MD.  Spouse states that patient will likely needs ambulance transport home once patient is stable to return to [facility] with hospice.   Transition of Care Asessment: Insurance and Status: Insurance coverage has been reviewed Patient has primary care physician: Yes Home environment has been reviewed: from [facility] Prior level of function:: Palliative care through [facility] Prior/Current Home Services: Current home services (active with [facility]) Social Drivers of Health Review: SDOH reviewed needs interventions Readmission risk has been reviewed: Yes Transition of care needs: (P) transition of care needs identified, TOC will continue to follow

## 2023-12-06 NOTE — Hospital Course (Signed)
 83 y.o. female with history of metastatic breast cancer, history of lung cancer, history of multiple metastasis from the breast cancer, chronic kidney disease stage III, essential hypertension, atrial fibrillation, dementia, GERD, hyperlipidemia, chronic diastolic heart failure, pulmonary hypertension, glaucoma, seizure disorder, who is from [place] brought in secondary to left lower extremity swelling.  Pt is currently on hospice.   Assessment and Plan:   RUL pulmonary embolism with extensive LLE DVT - Previously not on anticoagulation secondary to falls.  Noted history of extensive metastatic malignancy.  CT showing small RUL PE but noting DVT in left external iliac, common iliac, superficial and deep femoral veins.  No hypoxia or dyspnea.  IR to evaluate later this morning to discuss with husband whether thrombectomy warranted/appropriate.  Currently on heparin  drip.  Doppler pending.   Metastatic breast cancer - Extensive and advanced.  CT noting extensive new liver metastasis and extensive abdominal metastatic disease.  No active treatment.  Patient on hospice care.  Hospice following closely.   Chronic A-fib/GERD/hypothyroidism/hypertension - Resume home medication regiment.   Goals of care - Patient is hospice patient, followed by [facility].  Determining whether appropriate for procedure or not.  Unlikely to be discharged back to hospice care pending decision/procedure.

## 2023-12-06 NOTE — Progress Notes (Signed)
 PHARMACY - ANTICOAGULATION CONSULT NOTE  Pharmacy Consult for heparin  Indication: VTE treatment  Allergies  Allergen Reactions   Combigan [Brimonidine Tartrate-Timolol ] Itching    Per patient made eyes red, sore, and sensitivity to light   Sulfa Antibiotics Hives and Rash   Sulfamethoxazole-Trimethoprim Hives    Patient Measurements:    Vital Signs: Temp: 97.8 F (36.6 C) (10/06 0436) Temp Source: Oral (10/06 0148) BP: 127/74 (10/06 0436) Pulse Rate: 70 (10/06 0436)  Labs: Recent Labs    12/05/23 2000 12/05/23 2036 12/06/23 0425  HGB 14.9 16.3* 14.2  HCT 45.4 48.0* 42.8  PLT 155  --  145*  APTT  --   --  126*  HEPARINUNFRC  --   --  0.39  CREATININE 1.32* 1.40*  --     CrCl cannot be calculated (Unknown ideal weight.).   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 Dx 04/2015 at time of stroke. b. DCCV 06/2015 - did not hold. Amio started then stopped due to QT prolongation.   Chronic diastolic heart failure (HCC) 06/17/2015   CKD (chronic kidney disease), stage III (HCC)    Cough variant asthma    Dementia (HCC)    Diverticulosis    Essential hypertension    GERD (gastroesophageal reflux disease)    Glaucoma    Hiatal hernia    Hyperlipidemia    Hypothyroidism    Internal hemorrhoids    Laryngospasm    Mitral regurgitation    mild to moderate by echo 12/2019 and moderate by TEE 12/2020   Multinodular goiter    Osteopenia    Postmenopausal    Pulmonary HTN (HCC)     Moderate with PASP by echo 2018 likely Group 2 from pulmonary venous HTN from CHF>>normal PAP by echo 12/2019   Radiation 10/22/14-11/23/14   Left Breast   Stricture and stenosis of cervix    Stroke [facility])    TIAs    Assessment: 69 YOF presenting with leg pain/swelling, acute DVT, hx  of afib and was on Eliquis  however unclear if this was continued, will obtain baseline Anti-Xa level to determine  10/6 AM update:  Heparin  level therapeutic   Goal of Therapy:  Heparin  level 0.3-0.7 units/mL Monitor platelets by anticoagulation protocol: Yes   Plan:  Cont heparin  750 units/hr Heparin  level in 8 hours F/u VVS recs and plan   Lynwood Mckusick, PharmD, BCPS Clinical Pharmacist Phone: [phone]

## 2023-12-06 NOTE — Progress Notes (Signed)
 Progress Note   Patient: Patricia [name] FMW:[id number] DOB: 06-Jan-1941 DOA: 12/05/2023  DOS: the patient was seen and examined on 12/06/2023   Brief hospital course:  83 y.o. female with history of metastatic breast cancer, history of lung cancer, history of multiple metastasis from the breast cancer, chronic kidney disease stage III, essential hypertension, atrial fibrillation, dementia, GERD, hyperlipidemia, chronic diastolic heart failure, pulmonary hypertension, glaucoma, seizure disorder, who is from [place] brought in secondary to left lower extremity swelling.  Pt is currently on hospice.  Assessment and Plan:  RUL pulmonary embolism with extensive LLE DVT - Previously not on anticoagulation secondary to falls.  Noted history of extensive metastatic malignancy.  CT showing small RUL PE but noting DVT in left external iliac, common iliac, superficial and deep femoral veins.  No hypoxia or dyspnea.  IR to evaluate later this morning to discuss with husband whether thrombectomy warranted/appropriate.  Currently on heparin  drip.  Doppler pending.  Metastatic breast cancer - Extensive and advanced.  CT noting extensive new liver metastasis and extensive abdominal metastatic disease.  No active treatment.  Patient on hospice care.  Hospice following closely.  Chronic A-fib/GERD/hypothyroidism/hypertension - Resume home medication regiment.  Goals of care - Patient is hospice patient, followed by [facility].  Determining whether appropriate for procedure or not.  Unlikely to be discharged back to hospice care pending decision/procedure.  Subjective: Patient resting comfortably this morning.  Stated that she did not realize she had a blood clot.  Denies any shortness of breath, fever, chills, chest pain, nausea, vomiting, abdominal pain.  Physical Exam:  Vitals:   12/06/23 0140 12/06/23 0148 12/06/23 0436 12/06/23 0815  BP: 122/65  127/74 116/71  Pulse: 77 77 70 74  Resp: 19  18    Temp: 97.8 F (36.6 C) 97.8 F (36.6 C) 97.8 F (36.6 C) 97.9 F (36.6 C)  TempSrc:  Oral  Oral  SpO2: 98%  98% 100%    GENERAL:  Alert, pleasant, no acute distress, cachectic HEENT:  EOMI CARDIOVASCULAR:  RRR, no murmurs appreciated RESPIRATORY:  Clear to auscultation, no wheezing, rales, or rhonchi GASTROINTESTINAL:  Soft, nontender, nondistended EXTREMITIES: 3+ LLE pitting edema NEURO:  No new focal deficits appreciated SKIN:  No rashes noted PSYCH:  Appropriate mood and affect     Data Reviewed:  Imaging Studies: CT VENOGRAM ABD/PELVIS/LOWER EXT BILAT Result Date: 12/05/2023 CLINICAL DATA:  Left lower extremity swelling, redness and pain. Poor capillary refill. 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 EXAM: CT ANGIOGRAPHY CHEST WITH CONTRAST CT VENOGRAM ABDOMEN AND PELVIS AND LOWER EXTREMITY BILATERAL TECHNIQUE: Multidetector CT imaging of the chest was performed using standard protocol during bolus administration of intravenous contrast. Multiplanar CT image reconstructions and MIPS were obtained to evaluate the vascular anatomy. Portal phase abdomen and pelvis and systemic Venographic phase images of the abdomen, pelvis and lower extremities were obtained following the administration of intravenous contrast. Multiplanar reformats and maximum intensity projections were generated. RADIATION DOSE REDUCTION: This exam was performed according to the departmental dose-optimization program which includes automated exposure control, adjustment of the mA and/or kV according to patient size and/or use of iterative reconstruction technique. CONTRAST:  OMNIPAQUE  IOHEXOL  350 MG/ML SOLN COMPARISON:  The last chest x-ray was portable chest 08/20/2023, comparison made with chest, abdomen and pelvis CT with IV contrast 08/22/2023 and a PET-CT from 05/04/2023. FINDINGS: CT ANGIOGRAPHY CHEST  FINDINGS Cardiovascular: There  is a subsegmental right upper lobe artery to the anterior segment with nonocclusive thrombus on 5:64, and a subsegmental artery with nonocclusive thrombus in the posterior right upper lobe segment on 5:66. This is a small clot burden. No further embolus is seen. The pulmonary arteries and trunk are upper normal in caliber but unchanged, the pulmonary trunk measuring 2.7 cm. Stable cardiomegaly with a biatrial chamber predominance especially the left atrium. As before there is IVC contrast reflux which could be due to chronic right heart dysfunction, tricuspid regurgitation or rapid IV contrast injection. Small pericardial effusion is unchanged. No visible coronary calcification. Atherosclerosis in the aorta and great vessels is seen without aneurysmal dissection. Mild aortic tortuosity. Right IJ port catheter terminates in the SVC. Pulmonary veins are nondistended. Mediastinum/nodes: Old surgical clips left axilla. No axillary or intrathoracic adenopathy is seen. Negative trachea and main bronchi. 1 cm low-density right thyroid  nodule. No follow-up imaging recommended. Negative thoracic esophagus. Lungs/pleura: Similar to the prior studies there is a moderate left and small right layering pleural effusions with adjacent compressive atelectatic effect. There are postsurgical changes in the lingula from remote wedge resection. Subpleural chronic pleural-parenchymal thickening in the anterior left temporal lobe could be due to XRT. There is mosaicism in the right lower lobe basal segments, chronic septal thickening, could relate to interstitial and ground-glass edema or chronic change. There is chronic ground-glass disease in the left upper lobe. Atelectasis is noted bilaterally. Minimal apical paraseptal emphysematous change. Musculoskeletal: Osteopenia and advanced degenerative change of the spine. Interbody ankylosis T10 and T11. There are scattered sclerotic lesions in the lumbar  spine and ribcage, slightly increased from 2024 scans. No pathologic fractures. Postsurgical change left breast. CT VENOGRAM ABDOMEN AND PELVIS AND LOWER EXTREMITY BILATERAL Hepatobiliary: The liver is mildly steatotic. There are numerous new enhancing liver metastases since 08/24/2023. All segments are involved. These are too numerous to count. Some of them are solid and some of them are ring enhancing and centrally necrotic. Examples include a 1.4 cm metastasis in segment 7 on 3:12, 1.5 cm lesion laterally in segment 8 on 3:18, 1.7 cm lesion in segment 6 on 3:22, 2.4 cm mass in segment 4B on 3:20 and several tiny ones in the segments 2 and 3, for example a 9 mm lesion on 3:21. Gallbladder and bile ducts are unremarkable. Pancreas: Partially atrophic.  No mass.  No ductal dilatation. Spleen: No mass.  No splenomegaly. Adrenals/Urinary Tract: There is no right adrenal or renal mass enhancement, no urinary stone is seen. 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 There is prompt cortical uptake and excretion on the right, delayed cortical uptake and excretion on the left with moderate left hydronephrosis. On 6:45, level of L4-5 there is a 5 mm focus of soft tissue believed to be within the left ureter. The small ureteral filling defect could be a neoplasm versus a sloughed papilla. The pelvic left ureter is not well seen below this level. There is ill-defined enhancing soft tissue extending along the left pelvic wall down to the left posterolateral bladder wall which itself is enhancing and thickened. The pelvic left ureter may be engulfed in this enhancing soft tissue. Findings could be due to a new urothelial neoplasm with retroperitoneal spread, or other metastatic disease. There is also ill-defined abnormal enhancement and asymmetric prominence in the left iliopsoas muscle at and below the level of the left kidney which could represent further tumor involvement although an infectious complication is a further  possibility. Stomach/Bowel: Negative for  dilatation or overt wall thickening. Uncomplicated sigmoid diverticulosis. Negative appendix. Vascular/Lymphatic: As above there is ill-defined soft tissue along the left pelvic sidewall inseparable from the thickened enhancing outer left bladder wall. Elsewhere no discrete adenopathy. There is aortic atherosclerosis. Ill-defined material encasing the aortoiliac bifurcation without narrowing it, could represent developing mass or phlegmon. Reproductive: Intact uterus. Left ovary obscured within the left pelvic soft tissue abnormality. Right ovary normal in size. Other: Increased body wall anasarca, increased mesenteric congestive change, small volume perihepatic and pelvic ascites. No free air. Musculoskeletal: Chronic fixation hardware proximal left femur. Levoscoliosis and degenerative change lumbar spine. Mild compression fracture L5 probably pathologic with diffuse sclerotic vertebral metastatic involvement seen again at this level. Additional less extensive sclerotic metastases in the remainder of the lumbar spine. Interbody ankylosis chronically T12-L1. Increased metastatic sclerosis L3 body, L1 body. There is asymmetric edema in the visualized left lower extremity. No deep space collections or soft tissue gas. IVC: Patent. Portal and mesenteric veins: Patent. Bilateral iliac veins: Patent on the right. On the left some enhancement is seen at least in the proximal common iliac vein. The iliac venous bifurcation is obscured within the abnormal soft tissue in the left hemipelvis. Left internal iliac vein is not seen proximally and is likely proximally thrombosed. The proximal left external iliac vein is likely also encased by tumor and thrombosed. In the posterior left pelvic sidewall the internal iliac vein and tributaries do show enhancement. The visualized left external iliac vein is completely thrombosed. Right lower extremity: No evidence for thrombus involving the  common femoral, femoral, popliteal and visualized deep calf veins Left lower extremity: Extensive thrombosis noted in the common femoral, deep and superficial femoral, popliteal and visualized deep calf veins. The greater saphenous vein demonstrates preserved enhancement. IMPRESSION: 1. 2 small-vessel right upper lobe arterial emboli. No other embolus is seen. No findings of acute right heart strain. 2. Moderate left and small right pleural effusions, unchanged. 3. Right lower lobe interstitial changes which be chronic or due to mild edema. 4. Stable cardiomegaly with biatrial chamber predominance. Query chronic right heart dysfunction. 5. Innumerable new liver metastases. 6. New left-sided obstructive uropathy. 7. There appears to be a 5 mm filling defect in the mid left ureter which could be a sloughed papilla or neoplasm. 8. Below this level, new abnormal ill-defined soft tissue thickening around the aortic bifurcation continuing along the left pelvic sidewall down to an abnormal new thickening and enhancement of the left side of the bladder. The left ureter is probably engulfed within this. The proximal internal and external iliac veins are likely also encased. Findings could be due to new urothelial neoplasm or metastatic disease to the left pelvic wall and bladder. 9. Further abnormal heterogeneous enhancement and asymmetric enlargement of the left iliopsoas muscle, which could indicate tumor involvement or infectious complication. 10. DVT in the left external iliac vein, common iliac vein, superficial and deep femoral veins and visualized calf veins. 11. 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 12. Body wall and left lower extremity edema, mild mesenteric congestive changes, and mild ascites. 13. Critical Value/emergent results were called by telephone at the time of interpretation on 12/05/2023 at 10:32 pm to provider Dr. Dean, who verbally acknowledged these results.  Electronically Signed   By: Francis Quam M.D.   On: 12/05/2023 22:59   CT Angio Chest PE W and/or Wo Contrast Result Date: 12/05/2023 CLINICAL DATA:  Left lower extremity swelling, redness and pain. Poor capillary refill.  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 EXAM: CT ANGIOGRAPHY CHEST WITH CONTRAST CT VENOGRAM ABDOMEN AND PELVIS AND LOWER EXTREMITY BILATERAL TECHNIQUE: Multidetector CT imaging of the chest was performed using standard protocol during bolus administration of intravenous contrast. Multiplanar CT image reconstructions and MIPS were obtained to evaluate the vascular anatomy. Portal phase abdomen and pelvis and systemic Venographic phase images of the abdomen, pelvis and lower extremities were obtained following the administration of intravenous contrast. Multiplanar reformats and maximum intensity projections were generated. RADIATION DOSE REDUCTION: This exam was performed according to the departmental dose-optimization program which includes automated exposure control, adjustment of the mA and/or kV according to patient size and/or use of iterative reconstruction technique. CONTRAST:  OMNIPAQUE  IOHEXOL  350 MG/ML SOLN COMPARISON:  The last chest x-ray was portable chest 08/20/2023, comparison made with chest, abdomen and pelvis CT with IV contrast 08/22/2023 and a PET-CT from 05/04/2023. FINDINGS: CT ANGIOGRAPHY CHEST FINDINGS Cardiovascular: There is a subsegmental right upper lobe artery to the anterior segment with nonocclusive thrombus on 5:64, and a subsegmental artery with nonocclusive thrombus in the posterior right upper lobe segment on 5:66. This is a small clot burden. No further embolus is seen. The pulmonary arteries and trunk are upper normal in caliber but unchanged, the pulmonary trunk measuring 2.7 cm. Stable cardiomegaly with a biatrial chamber predominance especially  the left atrium. As before there is IVC contrast reflux which could be due to chronic right heart dysfunction, tricuspid regurgitation or rapid IV contrast injection. Small pericardial effusion is unchanged. No visible coronary calcification. Atherosclerosis in the aorta and great vessels is seen without aneurysmal dissection. Mild aortic tortuosity. Right IJ port catheter terminates in the SVC. Pulmonary veins are nondistended. Mediastinum/nodes: Old surgical clips left axilla. No axillary or intrathoracic adenopathy is seen. Negative trachea and main bronchi. 1 cm low-density right thyroid  nodule. No follow-up imaging recommended. Negative thoracic esophagus. Lungs/pleura: Similar to the prior studies there is a moderate left and small right layering pleural effusions with adjacent compressive atelectatic effect. There are postsurgical changes in the lingula from remote wedge resection. Subpleural chronic pleural-parenchymal thickening in the anterior left temporal lobe could be due to XRT. There is mosaicism in the right lower lobe basal segments, chronic septal thickening, could relate to interstitial and ground-glass edema or chronic change. There is chronic ground-glass disease in the left upper lobe. Atelectasis is noted bilaterally. Minimal apical paraseptal emphysematous change. Musculoskeletal: Osteopenia and advanced degenerative change of the spine. Interbody ankylosis T10 and T11. There are scattered sclerotic lesions in the lumbar spine and ribcage, slightly increased from 2024 scans. No pathologic fractures. Postsurgical change left breast. CT VENOGRAM ABDOMEN AND PELVIS AND LOWER EXTREMITY BILATERAL Hepatobiliary: The liver is mildly steatotic. There are numerous new enhancing liver metastases since 08/24/2023. All segments are involved. These are too numerous to count. Some of them are solid and some of them are ring enhancing and centrally necrotic. Examples include a 1.4 cm metastasis in segment 7  on 3:12, 1.5 cm lesion laterally in segment 8 on 3:18, 1.7 cm lesion in segment 6 on 3:22, 2.4 cm mass in segment 4B on 3:20 and several tiny ones in the segments 2 and 3, for example a 9 mm lesion on 3:21. Gallbladder and bile ducts are unremarkable. Pancreas: Partially atrophic.  No mass.  No ductal dilatation. Spleen: No mass.  No splenomegaly. Adrenals/Urinary Tract: There is no  right adrenal or renal mass enhancement, no urinary stone is seen. 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 There is prompt cortical uptake and excretion on the right, delayed cortical uptake and excretion on the left with moderate left hydronephrosis. On 6:45, level of L4-5 there is a 5 mm focus of soft tissue believed to be within the left ureter. The small ureteral filling defect could be a neoplasm versus a sloughed papilla. The pelvic left ureter is not well seen below this level. There is ill-defined enhancing soft tissue extending along the left pelvic wall down to the left posterolateral bladder wall which itself is enhancing and thickened. The pelvic left ureter may be engulfed in this enhancing soft tissue. Findings could be due to a new urothelial neoplasm with retroperitoneal spread, or other metastatic disease. There is also ill-defined abnormal enhancement and asymmetric prominence in the left iliopsoas muscle at and below the level of the left kidney which could represent further tumor involvement although an infectious complication is a further possibility. Stomach/Bowel: Negative for dilatation or overt wall thickening. Uncomplicated sigmoid diverticulosis. Negative appendix. Vascular/Lymphatic: As above there is ill-defined soft tissue along the left pelvic sidewall inseparable from the thickened enhancing outer left bladder wall. Elsewhere no discrete adenopathy. There is aortic atherosclerosis. Ill-defined material encasing the aortoiliac bifurcation without narrowing it, could represent developing mass or  phlegmon. Reproductive: Intact uterus. Left ovary obscured within the left pelvic soft tissue abnormality. Right ovary normal in size. Other: Increased body wall anasarca, increased mesenteric congestive change, small volume perihepatic and pelvic ascites. No free air. Musculoskeletal: Chronic fixation hardware proximal left femur. Levoscoliosis and degenerative change lumbar spine. Mild compression fracture L5 probably pathologic with diffuse sclerotic vertebral metastatic involvement seen again at this level. Additional less extensive sclerotic metastases in the remainder of the lumbar spine. Interbody ankylosis chronically T12-L1. Increased metastatic sclerosis L3 body, L1 body. There is asymmetric edema in the visualized left lower extremity. No deep space collections or soft tissue gas. IVC: Patent. Portal and mesenteric veins: Patent. Bilateral iliac veins: Patent on the right. On the left some enhancement is seen at least in the proximal common iliac vein. The iliac venous bifurcation is obscured within the abnormal soft tissue in the left hemipelvis. Left internal iliac vein is not seen proximally and is likely proximally thrombosed. The proximal left external iliac vein is likely also encased by tumor and thrombosed. In the posterior left pelvic sidewall the internal iliac vein and tributaries do show enhancement. The visualized left external iliac vein is completely thrombosed. Right lower extremity: No evidence for thrombus involving the common femoral, femoral, popliteal and visualized deep calf veins Left lower extremity: Extensive thrombosis noted in the common femoral, deep and superficial femoral, popliteal and visualized deep calf veins. The greater saphenous vein demonstrates preserved enhancement. IMPRESSION: 1. 2 small-vessel right upper lobe arterial emboli. No other embolus is seen. No findings of acute right heart strain. 2. Moderate left and small right pleural effusions, unchanged. 3. Right  lower lobe interstitial changes which be chronic or due to mild edema. 4. Stable cardiomegaly with biatrial chamber predominance. Query chronic right heart dysfunction. 5. Innumerable new liver metastases. 6. New left-sided obstructive uropathy. 7. There appears to be a 5 mm filling defect in the mid left ureter which could be a sloughed papilla or neoplasm. 8. Below this level, new abnormal ill-defined soft tissue thickening around the aortic bifurcation continuing along the left pelvic sidewall down to an abnormal new thickening and enhancement  of the left side of the bladder. The left ureter is probably engulfed within this. The proximal internal and external iliac veins are likely also encased. Findings could be due to new urothelial neoplasm or metastatic disease to the left pelvic wall and bladder. 9. Further abnormal heterogeneous enhancement and asymmetric enlargement of the left iliopsoas muscle, which could indicate tumor involvement or infectious complication. 10. DVT in the left external iliac vein, common iliac vein, superficial and deep femoral veins and visualized calf veins. 11. 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 12. Body wall and left lower extremity edema, mild mesenteric congestive changes, and mild ascites. 13. Critical Value/emergent results were called by telephone at the time of interpretation on 12/05/2023 at 10:32 pm to provider Dr. Dean, who verbally acknowledged these results. Electronically Signed   By: Francis Quam M.D.   On: 12/05/2023 22:59   DG Knee Complete 4 Views Left Result Date: 12/05/2023 CLINICAL DATA:  Left knee pain and swelling. EXAM: LEFT KNEE - COMPLETE 4+ VIEW COMPARISON:  None Available. FINDINGS: Four views. There is a small suprapatellar bursal effusion. Mild stranding edema in the visualized left lower extremity. There is a partially visible femoral intramedullary rod. The visualized rod shows no evidence of  loosening but there are multiple scattered shallow cortical defects in the visualized mid to distal left femoral shaft concerning for osteomyelitis. There is no evidence of fractures. There is meniscal chondrocalcinosis. Mild narrowing and spurring of the femorotibial joint is noted, more advanced joint space loss and osteophytosis of the patellofemoral joint. There is no evidence of erosive arthropathy.  There is osteopenia. IMPRESSION: 1. Unremarkable partially visible femoral intramedullary rod, but there are multiple scattered shallow cortical defects in the visualized mid to distal left femoral shaft worrisome for osteomyelitis. 2. Small suprapatellar bursal effusion. 3. Osteopenia and degenerative change. 4. Meniscal chondrocalcinosis. 5. Mild stranding edema in the visualized left lower extremity. Electronically Signed   By: Francis Quam M.D.   On: 12/05/2023 20:11    Results are pending, will review when available.  Previous records (including but not limited to H&P, progress notes, nursing notes, TOC management) were reviewed in assessment of this patient.  Labs: CBC: Recent Labs  Lab 12/05/23 2000 12/05/23 2036 12/06/23 0425  WBC 5.5  --  6.1  NEUTROABS 4.1  --   --   HGB 14.9 16.3* 14.2  HCT 45.4 48.0* 42.8  MCV 98.5  --  98.6  PLT 155  --  145*   Basic Metabolic Panel: Recent Labs  Lab 12/05/23 2000 12/05/23 2036  NA 130* 129*  K 3.5 3.6  CL 90* 94*  CO2 28  --   GLUCOSE 100* 98  BUN 14 17  CREATININE 1.32* 1.40*  CALCIUM  9.2  --    Liver Function Tests: Recent Labs  Lab 12/05/23 2000  AST 73*  ALT 34  ALKPHOS 131*  BILITOT 1.5*  PROT 6.7  ALBUMIN  2.7*   CBG: No results for input(s): GLUCAP in the last 168 hours.  Scheduled Meds: Continuous Infusions:  heparin  750 Units/hr (12/06/23 0415)   PRN Meds:.  Family Communication: None at bedside  Disposition: Status is: Inpatient Remains inpatient appropriate because: DVT     Time spent: 40  minutes  Length of inpatient stay: 1 days  Author: Carliss LELON Canales, DO 12/06/2023 10:54 AM  For on call review [email or web address].

## 2023-12-06 NOTE — Progress Notes (Addendum)
 PHARMACY - ANTICOAGULATION CONSULT NOTE  Pharmacy Consult for heparin  Indication: VTE treatment  Allergies  Allergen Reactions   Combigan [Brimonidine Tartrate-Timolol ] Itching    Per patient made eyes red, sore, and sensitivity to light   Sulfa Antibiotics Hives and Rash   Sulfamethoxazole-Trimethoprim Hives    Patient Measurements:    Vital Signs: Temp: 97.9 F (36.6 C) (10/06 1356) Temp Source: Oral (10/06 1356) BP: 121/66 (10/06 1356) Pulse Rate: 69 (10/06 1356)  Labs: Recent Labs    12/05/23 2000 12/05/23 2036 12/06/23 0425 12/06/23 1433  HGB 14.9 16.3* 14.2  --   HCT 45.4 48.0* 42.8  --   PLT 155  --  145*  --   APTT  --   --  126*  --   HEPARINUNFRC  --   --  0.39 0.65  CREATININE 1.32* 1.40*  --   --     CrCl cannot be calculated (Unknown ideal weight.).   Assessment: 52 YOF presenting with leg pain/swelling, acute DVT, hx of afib and was on Eliquis  previously but no longer taking.   Heparin  level therapeutic at 0.65 on 750 units/hr however trended up toward upper end of goal. No bleeding noted, CBC stable.   Goal of Therapy:  Heparin  level 0.3-0.7 units/mL Monitor platelets by anticoagulation protocol: Yes   Plan:  Decrease heparin  drip to 700 units/hr Daily heparin  level, CBC Monitor for s/sx of bleeding F/u IR recs and plan  Thank you for involving pharmacy in this patient's care.  Delon Sax, PharmD, BCPS Clinical Pharmacist Clinical phone for 12/06/2023 is [phone] 12/06/2023 3:49 PM

## 2023-12-06 NOTE — Progress Notes (Signed)
 Patricia [name] [id number] [facility] Liaison Note   [name] is a current patient with [facility] with a terminal diagnosis of malignant neoplasm of unspecified breast. She was transferred to the ED from [facility] with left lower extremity swelling and pain.  Patient was admitted 10.05.2025 for LLE DVT and found to also have RUL PE. She was started on a heparin  drip. Per Dr. Norleen Sella with [facility] this is a related hospital admission. Patient is a DNR.   Went to patient's room to speak with her or her husband. She was alone and appeared to be sleeping, in no apparent distress. Writer did not want to wake her. Attempted to call both numbers listed for her husband but was unable to reach him or leave a message. Plan is for IR to meet with both Mr. and Mrs. Otoole today to discuss whether a thrombectomy is appropriate. Internal Medicine MD Carliss Canales said that he has not heard anything from radiology yet.    Patient remains inpatient appropriate for skilled monitoring, assessment and medication titration for complex symptom management for DVT and PE.   V/S: 97.9, 121/66, 69,18, Sats 98% on room air   I/O: not recorded   Abnormal Labs: Na 129, Cr. 1.14, Alb 2.7, GFR 40   Diagnostics: CT chest showed two small vessel RUL arterial emboli, innumerable new liver mets, metastatic bone disease with progression lumbar spine since 6.22.2025 and DVT left common and external iliac veins, superficial and deep femoral veins.    IV/PRN medications: heparin  drip 7.5 ml/hour continuous      Recommendations/Plan per Dr. Carliss Canales progress note dated 10.06.2025:     RUL pulmonary embolism with extensive LLE DVT - Previously not on anticoagulation secondary to falls.  Noted history of extensive metastatic malignancy.  CT showing small RUL PE but noting DVT in left external iliac, common iliac, superficial and deep femoral veins.  No hypoxia or  dyspnea.  IR to evaluate later this morning to discuss with husband whether thrombectomy warranted/appropriate.  Currently on heparin  drip.  Doppler pending.   Metastatic breast cancer - Extensive and advanced.  CT noting extensive new liver metastasis and extensive abdominal metastatic disease.  No active treatment.  Patient on hospice care.  Hospice following closely.   Chronic A-fib/GERD/hypothyroidism/hypertension - Resume home medication regiment.    Goals of care - Patient is hospice patient, followed by [facility].  Determining whether appropriate for procedure or not.  Unlikely to be discharged back to hospice care pending decision/procedure.   Subjective: Patient resting comfortably this morning.  Stated that she did not realize she had a blood clot.  Denies any shortness of breath, fever, chills, chest pain, nausea, vomiting, abdominal pain.   Physical Exam:         Vitals:    12/06/23 0140 12/06/23 0148 12/06/23 0436 12/06/23 0815  BP: 122/65   127/74 116/71  Pulse: 77 77 70 74  Resp: 19   18    Temp: 97.8 F (36.6 C) 97.8 F (36.6 C) 97.8 F (36.6 C) 97.9 F (36.6 C)  TempSrc:   Oral   Oral  SpO2: 98%   98% 100%      GENERAL:  Alert, pleasant, no acute distress, cachectic HEENT:  EOMI CARDIOVASCULAR:  RRR, no murmurs appreciated RESPIRATORY:  Clear to auscultation, no wheezing, rales, or rhonchi GASTROINTESTINAL:  Soft, nontender, nondistended EXTREMITIES: 3+ LLE pitting edema NEURO:  No new focal deficits appreciated SKIN:  No rashes noted  PSYCH:  Appropriate mood and affect        Data Reviewed:   Imaging Studies:  Imaging Results  CT VENOGRAM ABD/PELVIS/LOWER EXT BILAT Result Date: 12/05/2023 CLINICAL DATA:  Left lower extremity swelling, redness and pain. Poor capillary refill. 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 EXAM: CT ANGIOGRAPHY CHEST WITH  CONTRAST CT VENOGRAM ABDOMEN AND PELVIS AND LOWER EXTREMITY BILATERAL TECHNIQUE: Multidetector CT imaging of the chest was performed using standard protocol during bolus administration of intravenous contrast. Multiplanar CT image reconstructions and MIPS were obtained to evaluate the vascular anatomy. Portal phase abdomen and pelvis and systemic Venographic phase images of the abdomen, pelvis and lower extremities were obtained following the administration of intravenous contrast. Multiplanar reformats and maximum intensity projections were generated. RADIATION DOSE REDUCTION: This exam was performed according to the departmental dose-optimization program which includes automated exposure control, adjustment of the mA and/or kV according to patient size and/or use of iterative reconstruction technique. CONTRAST:  OMNIPAQUE  IOHEXOL  350 MG/ML SOLN COMPARISON:  The last chest x-ray was portable chest 08/20/2023, comparison made with chest, abdomen and pelvis CT with IV contrast 08/22/2023 and a PET-CT from 05/04/2023. FINDINGS: CT ANGIOGRAPHY CHEST FINDINGS Cardiovascular: There is a subsegmental right upper lobe artery to the anterior segment with nonocclusive thrombus on 5:64, and a subsegmental artery with nonocclusive thrombus in the posterior right upper lobe segment on 5:66. This is a small clot burden. No further embolus is seen. The pulmonary arteries and trunk are upper normal in caliber but unchanged, the pulmonary trunk measuring 2.7 cm. Stable cardiomegaly with a biatrial chamber predominance especially the left atrium. As before there is IVC contrast reflux which could be due to chronic right heart dysfunction, tricuspid regurgitation or rapid IV contrast injection. Small pericardial effusion is unchanged. No visible coronary calcification. Atherosclerosis in the aorta and great vessels is seen without aneurysmal dissection. Mild aortic tortuosity. Right IJ port catheter terminates in the SVC.  Pulmonary veins are nondistended. Mediastinum/nodes: Old surgical clips left axilla. No axillary or intrathoracic adenopathy is seen. Negative trachea and main bronchi. 1 cm low-density right thyroid  nodule. No follow-up imaging recommended. Negative thoracic esophagus. Lungs/pleura: Similar to the prior studies there is a moderate left and small right layering pleural effusions with adjacent compressive atelectatic effect. There are postsurgical changes in the lingula from remote wedge resection. Subpleural chronic pleural-parenchymal thickening in the anterior left temporal lobe could be due to XRT. There is mosaicism in the right lower lobe basal segments, chronic septal thickening, could relate to interstitial and ground-glass edema or chronic change. There is chronic ground-glass disease in the left upper lobe. Atelectasis is noted bilaterally. Minimal apical paraseptal emphysematous change. Musculoskeletal: Osteopenia and advanced degenerative change of the spine. Interbody ankylosis T10 and T11. There are scattered sclerotic lesions in the lumbar spine and ribcage, slightly increased from 2024 scans. No pathologic fractures. Postsurgical change left breast. CT VENOGRAM ABDOMEN AND PELVIS AND LOWER EXTREMITY BILATERAL Hepatobiliary: The liver is mildly steatotic. There are numerous new enhancing liver metastases since 08/24/2023. All segments are involved. These are too numerous to count. Some of them are solid and some of them are ring enhancing and centrally necrotic. Examples include a 1.4 cm metastasis in segment 7 on 3:12, 1.5 cm lesion laterally in segment 8 on 3:18, 1.7 cm lesion in segment 6 on 3:22, 2.4 cm mass in segment 4B on 3:20 and  several tiny ones in the segments 2 and 3, for example a 9 mm lesion on 3:21. Gallbladder and bile ducts are unremarkable. Pancreas: Partially atrophic.  No mass.  No ductal dilatation. Spleen: No mass.  No splenomegaly. Adrenals/Urinary Tract: There is no right  adrenal or renal mass enhancement, no urinary stone is seen. 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 There is prompt cortical uptake and excretion on the right, delayed cortical uptake and excretion on the left with moderate left hydronephrosis. On 6:45, level of L4-5 there is a 5 mm focus of soft tissue believed to be within the left ureter. The small ureteral filling defect could be a neoplasm versus a sloughed papilla. The pelvic left ureter is not well seen below this level. There is ill-defined enhancing soft tissue extending along the left pelvic wall down to the left posterolateral bladder wall which itself is enhancing and thickened. The pelvic left ureter may be engulfed in this enhancing soft tissue. Findings could be due to a new urothelial neoplasm with retroperitoneal spread, or other metastatic disease. There is also ill-defined abnormal enhancement and asymmetric prominence in the left iliopsoas muscle at and below the level of the left kidney which could represent further tumor involvement although an infectious complication is a further possibility. Stomach/Bowel: Negative for dilatation or overt wall thickening. Uncomplicated sigmoid diverticulosis. Negative appendix. Vascular/Lymphatic: As above there is ill-defined soft tissue along the left pelvic sidewall inseparable from the thickened enhancing outer left bladder wall. Elsewhere no discrete adenopathy. There is aortic atherosclerosis. Ill-defined material encasing the aortoiliac bifurcation without narrowing it, could represent developing mass or phlegmon. Reproductive: Intact uterus. Left ovary obscured within the left pelvic soft tissue abnormality. Right ovary normal in size. Other: Increased body wall anasarca, increased mesenteric congestive change, small volume perihepatic and pelvic ascites. No free air. Musculoskeletal: Chronic fixation hardware proximal left femur. Levoscoliosis and degenerative change lumbar spine. Mild  compression fracture L5 probably pathologic with diffuse sclerotic vertebral metastatic involvement seen again at this level. Additional less extensive sclerotic metastases in the remainder of the lumbar spine. Interbody ankylosis chronically T12-L1. Increased metastatic sclerosis L3 body, L1 body. There is asymmetric edema in the visualized left lower extremity. No deep space collections or soft tissue gas. IVC: Patent. Portal and mesenteric veins: Patent. Bilateral iliac veins: Patent on the right. On the left some enhancement is seen at least in the proximal common iliac vein. The iliac venous bifurcation is obscured within the abnormal soft tissue in the left hemipelvis. Left internal iliac vein is not seen proximally and is likely proximally thrombosed. The proximal left external iliac vein is likely also encased by tumor and thrombosed. In the posterior left pelvic sidewall the internal iliac vein and tributaries do show enhancement. The visualized left external iliac vein is completely thrombosed. Right lower extremity: No evidence for thrombus involving the common femoral, femoral, popliteal and visualized deep calf veins Left lower extremity: Extensive thrombosis noted in the common femoral, deep and superficial femoral, popliteal and visualized deep calf veins. The greater saphenous vein demonstrates preserved enhancement. IMPRESSION: 1. 2 small-vessel right upper lobe arterial emboli. No other embolus is seen. No findings of acute right heart strain. 2. Moderate left and small right pleural effusions, unchanged. 3. Right lower lobe interstitial changes which be chronic or due to mild edema. 4. Stable cardiomegaly with biatrial chamber predominance. Query chronic right heart dysfunction. 5. Innumerable new liver metastases. 6. New left-sided obstructive uropathy. 7. There appears to be a 5  mm filling defect in the mid left ureter which could be a sloughed papilla or neoplasm. 8. Below this level, new  abnormal ill-defined soft tissue thickening around the aortic bifurcation continuing along the left pelvic sidewall down to an abnormal new thickening and enhancement of the left side of the bladder. The left ureter is probably engulfed within this. The proximal internal and external iliac veins are likely also encased. Findings could be due to new urothelial neoplasm or metastatic disease to the left pelvic wall and bladder. 9. Further abnormal heterogeneous enhancement and asymmetric enlargement of the left iliopsoas muscle, which could indicate tumor involvement or infectious complication. 10. DVT in the left external iliac vein, common iliac vein, superficial and deep femoral veins and visualized calf veins. 11. 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 12. Body wall and left lower extremity edema, mild mesenteric congestive changes, and mild ascites. 13. Critical Value/emergent results were called by telephone at the time of interpretation on 12/05/2023 at 10:32 pm to provider Dr. Dean, who verbally acknowledged these results. Electronically Signed   By: Francis Quam M.D.   On: 12/05/2023 22:59    CT Angio Chest PE W and/or Wo Contrast Result Date: 12/05/2023 CLINICAL DATA:  Left lower extremity swelling, redness and pain. Poor capillary refill. 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 EXAM: CT ANGIOGRAPHY CHEST WITH CONTRAST CT VENOGRAM ABDOMEN AND PELVIS AND LOWER EXTREMITY BILATERAL TECHNIQUE: Multidetector CT imaging of the chest was performed using standard protocol during bolus administration of intravenous contrast. Multiplanar CT image reconstructions and MIPS were obtained to evaluate the vascular anatomy. Portal phase abdomen and pelvis and systemic Venographic phase images of the abdomen, pelvis and lower extremities were obtained following the  administration of intravenous contrast. Multiplanar reformats and maximum intensity projections were generated. RADIATION DOSE REDUCTION: This exam was performed according to the departmental dose-optimization program which includes automated exposure control, adjustment of the mA and/or kV according to patient size and/or use of iterative reconstruction technique. CONTRAST:  OMNIPAQUE  IOHEXOL  350 MG/ML SOLN COMPARISON:  The last chest x-ray was portable chest 08/20/2023, comparison made with chest, abdomen and pelvis CT with IV contrast 08/22/2023 and a PET-CT from 05/04/2023. FINDINGS: CT ANGIOGRAPHY CHEST FINDINGS Cardiovascular: There is a subsegmental right upper lobe artery to the anterior segment with nonocclusive thrombus on 5:64, and a subsegmental artery with nonocclusive thrombus in the posterior right upper lobe segment on 5:66. This is a small clot burden. No further embolus is seen. The pulmonary arteries and trunk are upper normal in caliber but unchanged, the pulmonary trunk measuring 2.7 cm. Stable cardiomegaly with a biatrial chamber predominance especially the left atrium. As before there is IVC contrast reflux which could be due to chronic right heart dysfunction, tricuspid regurgitation or rapid IV contrast injection. Small pericardial effusion is unchanged. No visible coronary calcification. Atherosclerosis in the aorta and great vessels is seen without aneurysmal dissection. Mild aortic tortuosity. Right IJ port catheter terminates in the SVC. Pulmonary veins are nondistended. Mediastinum/nodes: Old surgical clips left axilla. No axillary or intrathoracic adenopathy is seen. Negative trachea and main bronchi. 1 cm low-density right thyroid  nodule. No follow-up imaging recommended. Negative thoracic esophagus. Lungs/pleura: Similar to the prior studies there is a moderate left and small right layering pleural effusions with adjacent compressive atelectatic effect. There are postsurgical  changes in the lingula from remote wedge  resection. Subpleural chronic pleural-parenchymal thickening in the anterior left temporal lobe could be due to XRT. There is mosaicism in the right lower lobe basal segments, chronic septal thickening, could relate to interstitial and ground-glass edema or chronic change. There is chronic ground-glass disease in the left upper lobe. Atelectasis is noted bilaterally. Minimal apical paraseptal emphysematous change. Musculoskeletal: Osteopenia and advanced degenerative change of the spine. Interbody ankylosis T10 and T11. There are scattered sclerotic lesions in the lumbar spine and ribcage, slightly increased from 2024 scans. No pathologic fractures. Postsurgical change left breast. CT VENOGRAM ABDOMEN AND PELVIS AND LOWER EXTREMITY BILATERAL Hepatobiliary: The liver is mildly steatotic. There are numerous new enhancing liver metastases since 08/24/2023. All segments are involved. These are too numerous to count. Some of them are solid and some of them are ring enhancing and centrally necrotic. Examples include a 1.4 cm metastasis in segment 7 on 3:12, 1.5 cm lesion laterally in segment 8 on 3:18, 1.7 cm lesion in segment 6 on 3:22, 2.4 cm mass in segment 4B on 3:20 and several tiny ones in the segments 2 and 3, for example a 9 mm lesion on 3:21. Gallbladder and bile ducts are unremarkable. Pancreas: Partially atrophic.  No mass.  No ductal dilatation. Spleen: No mass.  No splenomegaly. Adrenals/Urinary Tract: There is no right adrenal or renal mass enhancement, no urinary stone is seen. 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 There is prompt cortical uptake and excretion on the right, delayed cortical uptake and excretion on the left with moderate left hydronephrosis. On 6:45, level of L4-5 there is a 5 mm focus of soft tissue believed to be within the left ureter. The small ureteral filling defect could be a neoplasm versus a sloughed papilla. The pelvic left ureter is  not well seen below this level. There is ill-defined enhancing soft tissue extending along the left pelvic wall down to the left posterolateral bladder wall which itself is enhancing and thickened. The pelvic left ureter may be engulfed in this enhancing soft tissue. Findings could be due to a new urothelial neoplasm with retroperitoneal spread, or other metastatic disease. There is also ill-defined abnormal enhancement and asymmetric prominence in the left iliopsoas muscle at and below the level of the left kidney which could represent further tumor involvement although an infectious complication is a further possibility. Stomach/Bowel: Negative for dilatation or overt wall thickening. Uncomplicated sigmoid diverticulosis. Negative appendix. Vascular/Lymphatic: As above there is ill-defined soft tissue along the left pelvic sidewall inseparable from the thickened enhancing outer left bladder wall. Elsewhere no discrete adenopathy. There is aortic atherosclerosis. Ill-defined material encasing the aortoiliac bifurcation without narrowing it, could represent developing mass or phlegmon. Reproductive: Intact uterus. Left ovary obscured within the left pelvic soft tissue abnormality. Right ovary normal in size. Other: Increased body wall anasarca, increased mesenteric congestive change, small volume perihepatic and pelvic ascites. No free air. Musculoskeletal: Chronic fixation hardware proximal left femur. Levoscoliosis and degenerative change lumbar spine. Mild compression fracture L5 probably pathologic with diffuse sclerotic vertebral metastatic involvement seen again at this level. Additional less extensive sclerotic metastases in the remainder of the lumbar spine. Interbody ankylosis chronically T12-L1. Increased metastatic sclerosis L3 body, L1 body. There is asymmetric edema in the visualized left lower extremity. No deep space collections or soft tissue gas. IVC: Patent. Portal and mesenteric veins: Patent.  Bilateral iliac veins: Patent on the right. On the left some enhancement is seen at least in the proximal common iliac vein. The iliac venous  bifurcation is obscured within the abnormal soft tissue in the left hemipelvis. Left internal iliac vein is not seen proximally and is likely proximally thrombosed. The proximal left external iliac vein is likely also encased by tumor and thrombosed. In the posterior left pelvic sidewall the internal iliac vein and tributaries do show enhancement. The visualized left external iliac vein is completely thrombosed. Right lower extremity: No evidence for thrombus involving the common femoral, femoral, popliteal and visualized deep calf veins Left lower extremity: Extensive thrombosis noted in the common femoral, deep and superficial femoral, popliteal and visualized deep calf veins. The greater saphenous vein demonstrates preserved enhancement. IMPRESSION: 1. 2 small-vessel right upper lobe arterial emboli. No other embolus is seen. No findings of acute right heart strain. 2. Moderate left and small right pleural effusions, unchanged. 3. Right lower lobe interstitial changes which be chronic or due to mild edema. 4. Stable cardiomegaly with biatrial chamber predominance. Query chronic right heart dysfunction. 5. Innumerable new liver metastases. 6. New left-sided obstructive uropathy. 7. There appears to be a 5 mm filling defect in the mid left ureter which could be a sloughed papilla or neoplasm. 8. Below this level, new abnormal ill-defined soft tissue thickening around the aortic bifurcation continuing along the left pelvic sidewall down to an abnormal new thickening and enhancement of the left side of the bladder. The left ureter is probably engulfed within this. The proximal internal and external iliac veins are likely also encased. Findings could be due to new urothelial neoplasm or metastatic disease to the left pelvic wall and bladder. 9. Further abnormal heterogeneous  enhancement and asymmetric enlargement of the left iliopsoas muscle, which could indicate tumor involvement or infectious complication. 10. DVT in the left external iliac vein, common iliac vein, superficial and deep femoral veins and visualized calf veins. 11. 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 12. Body wall and left lower extremity edema, mild mesenteric congestive changes, and mild ascites. 13. Critical Value/emergent results were called by telephone at the time of interpretation on 12/05/2023 at 10:32 pm to provider Dr. Dean, who verbally acknowledged these results. Electronically Signed   By: Francis Quam M.D.   On: 12/05/2023 22:59    DG Knee Complete 4 Views Left Result Date: 12/05/2023 CLINICAL DATA:  Left knee pain and swelling. EXAM: LEFT KNEE - COMPLETE 4+ VIEW COMPARISON:  None Available. FINDINGS: Four views. There is a small suprapatellar bursal effusion. Mild stranding edema in the visualized left lower extremity. There is a partially visible femoral intramedullary rod. The visualized rod shows no evidence of loosening but there are multiple scattered shallow cortical defects in the visualized mid to distal left femoral shaft concerning for osteomyelitis. There is no evidence of fractures. There is meniscal chondrocalcinosis. Mild narrowing and spurring of the femorotibial joint is noted, more advanced joint space loss and osteophytosis of the patellofemoral joint. There is no evidence of erosive arthropathy.  There is osteopenia. IMPRESSION: 1. Unremarkable partially visible femoral intramedullary rod, but there are multiple scattered shallow cortical defects in the visualized mid to distal left femoral shaft worrisome for osteomyelitis. 2. Small suprapatellar bursal effusion. 3. Osteopenia and degenerative change. 4. Meniscal chondrocalcinosis. 5. Mild stranding edema in the visualized left lower extremity. Electronically Signed   By: Francis Quam M.D.   On: 12/05/2023 20:11       Results are pending, will review when available.   Previous records (including but not limited to H&P, progress notes, nursing notes,  TOC management) were reviewed in assessment of this patient.   Labs: CBC: Last Labs       Recent Labs  Lab 12/05/23 2000 12/05/23 2036 12/06/23 0425  WBC 5.5  --  6.1  NEUTROABS 4.1  --   --   HGB 14.9 16.3* 14.2  HCT 45.4 48.0* 42.8  MCV 98.5  --  98.6  PLT 155  --  145*      Basic Metabolic Panel: Last Labs      Recent Labs  Lab 12/05/23 2000 12/05/23 2036  NA 130* 129*  K 3.5 3.6  CL 90* 94*  CO2 28  --   GLUCOSE 100* 98  BUN 14 17  CREATININE 1.32* 1.40*  CALCIUM  9.2  --       Liver Function Tests: Last Labs     Recent Labs  Lab 12/05/23 2000  AST 73*  ALT 34  ALKPHOS 131*  BILITOT 1.5*  PROT 6.7  ALBUMIN  2.7*      CBG: Last Labs  No results for input(s): GLUCAP in the last 168 hours.     Scheduled Meds:      Continuous Infusions:  heparin  750 Units/hr (12/06/23 0415)        PRN Meds:.        Family Communication: None at bedside   Disposition: Status is: Inpatient Remains inpatient appropriate because: DVT       Discharge Planning: resumption of home hospice services   Family contact: patient's husband Carlin [phone]   IDT: updated   Goals of care: clear, comfort focused care, DNR   Should patient need ambulance transport, please use [facility] as they contract this service for our active hospice patients.   Please call with any hospice questions or concerns.   Greig Basket, BSN, RN [facility] Liaison [phone]

## 2023-12-06 NOTE — Progress Notes (Signed)
 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   [facility] will continue to follow for any discharge planning needs and to coordinate continuation of hospice care. See media tab for additional hospice information.  Please don't hesitate to call with any Hospice related questions or concerns.    Thank you for the opportunity to participate in this patient's care.  Greig Basket, BSN, RN [facility] Liaison [phone]

## 2023-12-06 NOTE — Plan of Care (Signed)

## 2023-12-06 NOTE — Progress Notes (Signed)
 Left lower extremity venous duplex has been completed.  Results can be found in chart review under CV Proc.  12/06/2023 4:11 PM  Yuleimy Kretz Elden Appl, RVT.

## 2023-12-07 DIAGNOSIS — C50412 Malignant neoplasm of upper-outer quadrant of left female breast: Secondary | ICD-10-CM | POA: Diagnosis not present

## 2023-12-07 DIAGNOSIS — Z66 Do not resuscitate: Secondary | ICD-10-CM | POA: Diagnosis not present

## 2023-12-07 DIAGNOSIS — G40909 Epilepsy, unspecified, not intractable, without status epilepticus: Secondary | ICD-10-CM

## 2023-12-07 DIAGNOSIS — Z170 Estrogen receptor positive status [ER+]: Secondary | ICD-10-CM

## 2023-12-07 DIAGNOSIS — Z85118 Personal history of other malignant neoplasm of bronchus and lung: Secondary | ICD-10-CM

## 2023-12-07 DIAGNOSIS — C50919 Malignant neoplasm of unspecified site of unspecified female breast: Secondary | ICD-10-CM | POA: Diagnosis not present

## 2023-12-07 DIAGNOSIS — I82422 Acute embolism and thrombosis of left iliac vein: Principal | ICD-10-CM

## 2023-12-07 LAB — CBC
HCT: 37.3 % (ref 36.0–46.0)
Hemoglobin: 12.5 g/dL (ref 12.0–15.0)
MCH: 32.6 pg (ref 26.0–34.0)
MCHC: 33.5 g/dL (ref 30.0–36.0)
MCV: 97.1 fL (ref 80.0–100.0)
Platelets: 173 K/uL (ref 150–400)
RBC: 3.84 MIL/uL — ABNORMAL LOW (ref 3.87–5.11)
RDW: 13.1 % (ref 11.5–15.5)
WBC: 5.5 K/uL (ref 4.0–10.5)
nRBC: 0 % (ref 0.0–0.2)

## 2023-12-07 LAB — HEPARIN LEVEL (UNFRACTIONATED): Heparin Unfractionated: 0.93 [IU]/mL — ABNORMAL HIGH (ref 0.30–0.70)

## 2023-12-07 MED ORDER — OXYCODONE HCL 5 MG PO TABS: 5.0000 mg | ORAL_TABLET | ORAL | Status: DC | PRN

## 2023-12-07 MED ORDER — ALTEPLASE 2 MG IJ SOLR
2.0000 mg | Freq: Once | INTRAMUSCULAR | Status: AC
  Administered 2023-12-07: 2 mg
  Filled 2023-12-07: qty 2

## 2023-12-07 MED ORDER — CHLORHEXIDINE GLUCONATE CLOTH 2 % EX PADS
6.0000 | MEDICATED_PAD | Freq: Every day | CUTANEOUS | Status: DC
  Administered 2023-12-07: 6 via TOPICAL

## 2023-12-07 MED ORDER — TRAMADOL HCL 50 MG PO TABS: 50.0000 mg | ORAL_TABLET | Freq: Four times a day (QID) | ORAL | Status: DC | PRN

## 2023-12-07 MED ORDER — APIXABAN (ELIQUIS) VTE STARTER PACK (10MG AND 5MG): ORAL_TABLET | ORAL | Status: DC

## 2023-12-07 MED ORDER — SODIUM CHLORIDE 0.9% FLUSH: 10.0000 mL | INTRAVENOUS | Status: DC | PRN

## 2023-12-07 MED ORDER — POTASSIUM CHLORIDE 10 MEQ/100ML IV SOLN
10.0000 meq | INTRAVENOUS | Status: DC
  Administered 2023-12-07 (×2): 10 meq via INTRAVENOUS
  Filled 2023-12-07: qty 100

## 2023-12-07 NOTE — Progress Notes (Signed)
 PHARMACY - ANTICOAGULATION CONSULT NOTE  Pharmacy Consult for heparin  Indication: VTE treatment  Allergies  Allergen Reactions   Combigan [Brimonidine Tartrate-Timolol ] Itching    Per patient made eyes red, sore, and sensitivity to light   Sulfa Antibiotics Hives and Rash   Sulfamethoxazole-Trimethoprim Hives    Patient Measurements:    Vital Signs: Temp: 97.4 F (36.3 C) (10/07 1108) Temp Source: Oral (10/07 0731) BP: 106/66 (10/07 1108) Pulse Rate: 79 (10/07 1108)  Labs: Recent Labs    12/05/23 2000 12/05/23 2036 12/06/23 0425 12/06/23 1433 12/06/23 2050 12/07/23 0626 12/07/23 0730  HGB 14.9 16.3* 14.2  --  13.6 12.5  --   HCT 45.4 48.0* 42.8  --  40.8 37.3  --   PLT 155  --  145*  --  154 173  --   APTT  --   --  126*  --   --   --   --   HEPARINUNFRC  --   --  0.39 0.65  --   --  0.93*  CREATININE 1.32* 1.40*  --   --  1.44*  --   --     CrCl cannot be calculated (Unknown ideal weight.).   Assessment: 33 YOF presenting with leg pain/swelling, acute DVT, hx of afib and was on Eliquis  previously but no longer taking (stopped due to frailty / risk of falls.)   Heparin  level supratherapeutic at 0.93 on 700 units/hr. No bleeding noted.   Goal of Therapy:  Heparin  level 0.3-0.7 units/mL Monitor platelets by anticoagulation protocol: Yes   Plan:  Decrease heparin  drip to 550 units/hr Daily heparin  level, CBC Monitor for s/sx of bleeding F/u IR recs and plan   Thank you for involving pharmacy in this patient's care.  [employer], Pharm.D., BCPS Clinical Pharmacist Clinical phone for 12/07/2023 from 7:30-3:00 is [phone].  **Pharmacist phone directory can be found on [email or web address] listed under [facility].  12/07/2023 12:10 PM

## 2023-12-07 NOTE — Plan of Care (Signed)

## 2023-12-07 NOTE — Progress Notes (Signed)
 [name] [id number] [facility] Liaison Note   [name] is a current patient with [facility] with a terminal diagnosis of malignant neoplasm of unspecified breast. She was transferred to the ED from [facility] with left lower extremity swelling and pain.  Patient was admitted 10.05.2025 for LLE DVT and found to also have RUL PE. She was started on a heparin  drip. Per Dr. Norleen Sella with [facility] this is a related hospital admission. Patient is a DNR.   Went to the bedside and met with the patient and her husband. Patricia [name] was alert and oriented and had no complaints of pain. Her husband said that she was nauseated earlier but they gave her some medication. He said that she was hungry and ordered a hamburger for lunch. He said that they were waiting to speak with interventional radiology when writer was meeting with them.  IR met with the patient and her husband and there will be no surgical intervention. She will likely be discharged back to [facility] tomorrow if stable.   Patient remains inpatient appropriate for skilled monitoring, assessment and medication titration for complex symptom management for DVT and PE.   V/S: 97.4, 106/66, 79,18, Sats 96% on room air  I/O: not recorded  Abnormal Labs: Na 132, Alb 2.4, GFR 36, Glucose 144, AST 68, K 3.3 Diagnostics: no new tests  IV/PRN medications: heparin  drip 7.5 ml/hour continuous, morphine  2mg  IV Q2H PRN x2, Zofran  4mg  IV Q6H PRN x1, Tramadol  50mg  tab Q6H PRN x1.   Recommendations/Plan per Dr. Carliss Canales progress note dated 10.07.2025:  RUL pulmonary embolism with extensive LLE DVT - Previously not on anticoagulation secondary to falls, no hx of bleed/stroke per husband.  Noted extensive metastatic malignancy.  CT showing small RUL PE but noting DVT in left external iliac, common iliac, superficial and deep femoral veins.  2-3+ LLE edema.  No hypoxia or dyspnea, but does  complain of intermittent leg pain.  IR to evaluate later this morning to discuss with husband whether thrombectomy/IVC filter warranted/appropriate/etc.  Currently on heparin  drip.  Doppler confirming DVT.   Metastatic breast cancer - Extensive and advanced.  CT noting extensive new liver metastasis and extensive abdominal metastatic disease.  No active treatment.  Patient on hospice care.  Hospice following closely.  Will DC back to hospice eventually.   Chronic A-fib/GERD/hypothyroidism/hypertension - Resume home medication regiment.   Goals of care - Patient is hospice patient, followed by [facility].  Determining whether appropriate for procedure or not.  Likely to be discharged back to hospice care, timing dependant on decision/procedure.     Subjective: Patient resting comfortably this morning.  Intermittently complaining of left lower extremity pain but this morning says she feels okay.  Denies any fever, chills, shortness of breath, chest pain, nausea, vomiting, abdominal pain.  Still has left lower extremity swelling.   Was able to update and talk to the husband over the phone.  Patient was not on anticoagulation purely due to frailty and risk of fall.  Was on Eliquis  prior.  Updated him on her current situation, discussion about benefits and risk of thrombectomy, whether it is palliative or necessary at all.  Husband was appreciative of the information.  Stated he will come to the hospital later today to talk with interventional radiologist.   Physical Exam:         Vitals:    12/06/23 1644 12/06/23 2026 12/07/23 0507 12/07/23 0731  BP: (P) 122/69 122/66 120/73 ROLLEN)  110/59  Pulse: (P) 75 75 71 71  Resp:   18 17    Temp:   97.9 F (36.6 C) 98.5 F (36.9 C) 97.7 F (36.5 C)  TempSrc:   Oral Oral Oral  SpO2: (P) 99% 96% 99% 98%      GENERAL:  Alert, pleasant, no acute distress, cachectic HEENT:  EOMI CARDIOVASCULAR:  RRR, no murmurs appreciated RESPIRATORY:  Clear to  auscultation, no wheezing, rales, or rhonchi GASTROINTESTINAL:  Soft, nontender, nondistended EXTREMITIES: 2-3+ LLE pitting edema NEURO:  No new focal deficits appreciated SKIN:  No rashes noted PSYCH:  Appropriate mood and affect      Data Reviewed:   Imaging Studies:  Imaging Results  VAS US  LOWER EXTREMITY VENOUS (DVT) (ONLY [facility] & [facility]) Result Date: 12/06/2023  Lower Venous DVT Study Patient Name:  Patricia [name]  Date of Exam:   12/06/2023 Medical Rec #: [id number]    Accession #:    [id number] Date of Birth: Jun 22, 1940    Patient Gender: F Patient Age:   83 years Exam Location:  [facility] Procedure:      VAS US  LOWER EXTREMITY VENOUS (DVT) Referring Phys: LAMAR PATERSON --------------------------------------------------------------------------------  Indications: Pain, Swelling, and H/O breast and lung cancer. Other Indications: + CT for DVT. Comparison Study: No prior exam. Performing Technologist: Edilia Elden Appl  Examination Guidelines: A complete evaluation includes B-mode imaging, spectral Doppler, color Doppler, and power Doppler as needed of all accessible portions of each vessel. Bilateral testing is considered an integral part of a complete examination. Limited examinations for reoccurring indications may be performed as noted. The reflux portion of the exam is performed with the patient in reverse Trendelenburg.  +-----+---------------+---------+-----------+----------+--------------+ RIGHTCompressibilityPhasicitySpontaneityPropertiesThrombus Aging +-----+---------------+---------+-----------+----------+--------------+ CFV  Full           Yes      Yes                                 +-----+---------------+---------+-----------+----------+--------------+ SFJ  Full           Yes      Yes                                 +-----+---------------+---------+-----------+----------+--------------+    +---------+---------------+---------+-----------+----------+--------------+ LEFT     CompressibilityPhasicitySpontaneityPropertiesThrombus Aging +---------+---------------+---------+-----------+----------+--------------+ CFV      None           No       No                                  +---------+---------------+---------+-----------+----------+--------------+ SFJ      None           No       No                                  +---------+---------------+---------+-----------+----------+--------------+ FV Prox  None           No       No                                  +---------+---------------+---------+-----------+----------+--------------+ FV Mid   None  No       No                                  +---------+---------------+---------+-----------+----------+--------------+ FV DistalNone           No       No                                  +---------+---------------+---------+-----------+----------+--------------+ PFV      None           No       No                                  +---------+---------------+---------+-----------+----------+--------------+ POP      None           No       No                                  +---------+---------------+---------+-----------+----------+--------------+ PTV      Full           No       No                                  +---------+---------------+---------+-----------+----------+--------------+ PERO     Partial        No       No                                  +---------+---------------+---------+-----------+----------+--------------+ Soleal   None           No       No                                  +---------+---------------+---------+-----------+----------+--------------+ Gastroc  None           No       No                                  +---------+---------------+---------+-----------+----------+--------------+ CIV                     Yes      Yes                   Not visualized +---------+---------------+---------+-----------+----------+--------------+ EIV                     No       No                                  +---------+---------------+---------+-----------+----------+--------------+ Left common Iliac vein was not well visualized but had minimal flow. Left posterior tibial veins are compressible with no color or pulsed wave doppler present.    Summary: RIGHT: - No evidence of common femoral vein obstruction.   LEFT: - Findings consistent with acute deep vein  thrombosis involving the left common femoral vein, SF junction, left femoral vein, left proximal profunda vein, left popliteal vein, left peroneal veins, and Common Iliac Vein and External Iliac Vein. Findings consistent with acute intramuscular thrombosis involving the left soleal veins, and left gastrocnemius veins.  *See table(s) above for measurements and observations. Electronically signed by Fonda Rim on 12/06/2023 at 3:21:22 PM.    Final     CT VENOGRAM ABD/PELVIS/LOWER EXT BILAT Result Date: 12/05/2023 CLINICAL DATA:  Left lower extremity swelling, redness and pain. Poor capillary refill. 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 EXAM: CT ANGIOGRAPHY CHEST WITH CONTRAST CT VENOGRAM ABDOMEN AND PELVIS AND LOWER EXTREMITY BILATERAL TECHNIQUE: Multidetector CT imaging of the chest was performed using standard protocol during bolus administration of intravenous contrast. Multiplanar CT image reconstructions and MIPS were obtained to evaluate the vascular anatomy. Portal phase abdomen and pelvis and systemic Venographic phase images of the abdomen, pelvis and lower extremities were obtained following the administration of intravenous contrast. Multiplanar reformats and maximum intensity projections were generated. RADIATION DOSE REDUCTION: This exam was performed according to the  departmental dose-optimization program which includes automated exposure control, adjustment of the mA and/or kV according to patient size and/or use of iterative reconstruction technique. CONTRAST:  OMNIPAQUE  IOHEXOL  350 MG/ML SOLN COMPARISON:  The last chest x-ray was portable chest 08/20/2023, comparison made with chest, abdomen and pelvis CT with IV contrast 08/22/2023 and a PET-CT from 05/04/2023. FINDINGS: CT ANGIOGRAPHY CHEST FINDINGS Cardiovascular: There is a subsegmental right upper lobe artery to the anterior segment with nonocclusive thrombus on 5:64, and a subsegmental artery with nonocclusive thrombus in the posterior right upper lobe segment on 5:66. This is a small clot burden. No further embolus is seen. The pulmonary arteries and trunk are upper normal in caliber but unchanged, the pulmonary trunk measuring 2.7 cm. Stable cardiomegaly with a biatrial chamber predominance especially the left atrium. As before there is IVC contrast reflux which could be due to chronic right heart dysfunction, tricuspid regurgitation or rapid IV contrast injection. Small pericardial effusion is unchanged. No visible coronary calcification. Atherosclerosis in the aorta and great vessels is seen without aneurysmal dissection. Mild aortic tortuosity. Right IJ port catheter terminates in the SVC. Pulmonary veins are nondistended. Mediastinum/nodes: Old surgical clips left axilla. No axillary or intrathoracic adenopathy is seen. Negative trachea and main bronchi. 1 cm low-density right thyroid  nodule. No follow-up imaging recommended. Negative thoracic esophagus. Lungs/pleura: Similar to the prior studies there is a moderate left and small right layering pleural effusions with adjacent compressive atelectatic effect. There are postsurgical changes in the lingula from remote wedge resection. Subpleural chronic pleural-parenchymal thickening in the anterior left temporal lobe could be due to XRT. There is mosaicism in  the right lower lobe basal segments, chronic septal thickening, could relate to interstitial and ground-glass edema or chronic change. There is chronic ground-glass disease in the left upper lobe. Atelectasis is noted bilaterally. Minimal apical paraseptal emphysematous change. Musculoskeletal: Osteopenia and advanced degenerative change of the spine. Interbody ankylosis T10 and T11. There are scattered sclerotic lesions in the lumbar spine and ribcage, slightly increased from 2024 scans. No pathologic fractures. Postsurgical change left breast. CT VENOGRAM ABDOMEN AND PELVIS AND LOWER EXTREMITY BILATERAL Hepatobiliary: The liver is mildly steatotic. There are numerous new enhancing liver metastases since 08/24/2023. All segments are involved. These are too numerous to count. Some of them are  solid and some of them are ring enhancing and centrally necrotic. Examples include a 1.4 cm metastasis in segment 7 on 3:12, 1.5 cm lesion laterally in segment 8 on 3:18, 1.7 cm lesion in segment 6 on 3:22, 2.4 cm mass in segment 4B on 3:20 and several tiny ones in the segments 2 and 3, for example a 9 mm lesion on 3:21. Gallbladder and bile ducts are unremarkable. Pancreas: Partially atrophic.  No mass.  No ductal dilatation. Spleen: No mass.  No splenomegaly. Adrenals/Urinary Tract: There is no right adrenal or renal mass enhancement, no urinary stone is seen. 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 There is prompt cortical uptake and excretion on the right, delayed cortical uptake and excretion on the left with moderate left hydronephrosis. On 6:45, level of L4-5 there is a 5 mm focus of soft tissue believed to be within the left ureter. The small ureteral filling defect could be a neoplasm versus a sloughed papilla. The pelvic left ureter is not well seen below this level. There is ill-defined enhancing soft tissue extending along the left pelvic wall down to the left posterolateral bladder wall which itself is  enhancing and thickened. The pelvic left ureter may be engulfed in this enhancing soft tissue. Findings could be due to a new urothelial neoplasm with retroperitoneal spread, or other metastatic disease. There is also ill-defined abnormal enhancement and asymmetric prominence in the left iliopsoas muscle at and below the level of the left kidney which could represent further tumor involvement although an infectious complication is a further possibility. Stomach/Bowel: Negative for dilatation or overt wall thickening. Uncomplicated sigmoid diverticulosis. Negative appendix. Vascular/Lymphatic: As above there is ill-defined soft tissue along the left pelvic sidewall inseparable from the thickened enhancing outer left bladder wall. Elsewhere no discrete adenopathy. There is aortic atherosclerosis. Ill-defined material encasing the aortoiliac bifurcation without narrowing it, could represent developing mass or phlegmon. Reproductive: Intact uterus. Left ovary obscured within the left pelvic soft tissue abnormality. Right ovary normal in size. Other: Increased body wall anasarca, increased mesenteric congestive change, small volume perihepatic and pelvic ascites. No free air. Musculoskeletal: Chronic fixation hardware proximal left femur. Levoscoliosis and degenerative change lumbar spine. Mild compression fracture L5 probably pathologic with diffuse sclerotic vertebral metastatic involvement seen again at this level. Additional less extensive sclerotic metastases in the remainder of the lumbar spine. Interbody ankylosis chronically T12-L1. Increased metastatic sclerosis L3 body, L1 body. There is asymmetric edema in the visualized left lower extremity. No deep space collections or soft tissue gas. IVC: Patent. Portal and mesenteric veins: Patent. Bilateral iliac veins: Patent on the right. On the left some enhancement is seen at least in the proximal common iliac vein. The iliac venous bifurcation is obscured within the  abnormal soft tissue in the left hemipelvis. Left internal iliac vein is not seen proximally and is likely proximally thrombosed. The proximal left external iliac vein is likely also encased by tumor and thrombosed. In the posterior left pelvic sidewall the internal iliac vein and tributaries do show enhancement. The visualized left external iliac vein is completely thrombosed. Right lower extremity: No evidence for thrombus involving the common femoral, femoral, popliteal and visualized deep calf veins Left lower extremity: Extensive thrombosis noted in the common femoral, deep and superficial femoral, popliteal and visualized deep calf veins. The greater saphenous vein demonstrates preserved enhancement. IMPRESSION: 1. 2 small-vessel right upper lobe arterial emboli. No other embolus is seen. No findings of acute right heart strain. 2. Moderate left and  small right pleural effusions, unchanged. 3. Right lower lobe interstitial changes which be chronic or due to mild edema. 4. Stable cardiomegaly with biatrial chamber predominance. Query chronic right heart dysfunction. 5. Innumerable new liver metastases. 6. New left-sided obstructive uropathy. 7. There appears to be a 5 mm filling defect in the mid left ureter which could be a sloughed papilla or neoplasm. 8. Below this level, new abnormal ill-defined soft tissue thickening around the aortic bifurcation continuing along the left pelvic sidewall down to an abnormal new thickening and enhancement of the left side of the bladder. The left ureter is probably engulfed within this. The proximal internal and external iliac veins are likely also encased. Findings could be due to new urothelial neoplasm or metastatic disease to the left pelvic wall and bladder. 9. Further abnormal heterogeneous enhancement and asymmetric enlargement of the left iliopsoas muscle, which could indicate tumor involvement or infectious complication. 10. DVT in the left external iliac vein,  common iliac vein, superficial and deep femoral veins and visualized calf veins. 11. 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 12. Body wall and left lower extremity edema, mild mesenteric congestive changes, and mild ascites. 13. Critical Value/emergent results were called by telephone at the time of interpretation on 12/05/2023 at 10:32 pm to provider Dr. Dean, who verbally acknowledged these results. Electronically Signed   By: Francis Quam M.D.   On: 12/05/2023 22:59    CT Angio Chest PE W and/or Wo Contrast Result Date: 12/05/2023 CLINICAL DATA:  Left lower extremity swelling, redness and pain. Poor capillary refill. 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 EXAM: CT ANGIOGRAPHY CHEST WITH CONTRAST CT VENOGRAM ABDOMEN AND PELVIS AND LOWER EXTREMITY BILATERAL TECHNIQUE: Multidetector CT imaging of the chest was performed using standard protocol during bolus administration of intravenous contrast. Multiplanar CT image reconstructions and MIPS were obtained to evaluate the vascular anatomy. Portal phase abdomen and pelvis and systemic Venographic phase images of the abdomen, pelvis and lower extremities were obtained following the administration of intravenous contrast. Multiplanar reformats and maximum intensity projections were generated. RADIATION DOSE REDUCTION: This exam was performed according to the departmental dose-optimization program which includes automated exposure control, adjustment of the mA and/or kV according to patient size and/or use of iterative reconstruction technique. CONTRAST:  OMNIPAQUE  IOHEXOL  350 MG/ML SOLN COMPARISON:  The last chest x-ray was portable chest 08/20/2023, comparison made with chest, abdomen and pelvis CT with IV contrast 08/22/2023 and a PET-CT from 05/04/2023. FINDINGS: CT ANGIOGRAPHY CHEST FINDINGS Cardiovascular:  There is a subsegmental right upper lobe artery to the anterior segment with nonocclusive thrombus on 5:64, and a subsegmental artery with nonocclusive thrombus in the posterior right upper lobe segment on 5:66. This is a small clot burden. No further embolus is seen. The pulmonary arteries and trunk are upper normal in caliber but unchanged, the pulmonary trunk measuring 2.7 cm. Stable cardiomegaly with a biatrial chamber predominance especially the left atrium. As before there is IVC contrast reflux which could be due to chronic right heart dysfunction, tricuspid regurgitation or rapid IV contrast injection. Small pericardial effusion is unchanged. No visible coronary calcification. Atherosclerosis in the aorta and great vessels is seen without aneurysmal dissection. Mild aortic tortuosity. Right IJ port catheter terminates in the SVC. Pulmonary veins are nondistended. Mediastinum/nodes: Old surgical clips left axilla. No axillary or intrathoracic adenopathy is seen. Negative trachea and  main bronchi. 1 cm low-density right thyroid  nodule. No follow-up imaging recommended. Negative thoracic esophagus. Lungs/pleura: Similar to the prior studies there is a moderate left and small right layering pleural effusions with adjacent compressive atelectatic effect. There are postsurgical changes in the lingula from remote wedge resection. Subpleural chronic pleural-parenchymal thickening in the anterior left temporal lobe could be due to XRT. There is mosaicism in the right lower lobe basal segments, chronic septal thickening, could relate to interstitial and ground-glass edema or chronic change. There is chronic ground-glass disease in the left upper lobe. Atelectasis is noted bilaterally. Minimal apical paraseptal emphysematous change. Musculoskeletal: Osteopenia and advanced degenerative change of the spine. Interbody ankylosis T10 and T11. There are scattered sclerotic lesions in the lumbar spine and ribcage, slightly  increased from 2024 scans. No pathologic fractures. Postsurgical change left breast. CT VENOGRAM ABDOMEN AND PELVIS AND LOWER EXTREMITY BILATERAL Hepatobiliary: The liver is mildly steatotic. There are numerous new enhancing liver metastases since 08/24/2023. All segments are involved. These are too numerous to count. Some of them are solid and some of them are ring enhancing and centrally necrotic. Examples include a 1.4 cm metastasis in segment 7 on 3:12, 1.5 cm lesion laterally in segment 8 on 3:18, 1.7 cm lesion in segment 6 on 3:22, 2.4 cm mass in segment 4B on 3:20 and several tiny ones in the segments 2 and 3, for example a 9 mm lesion on 3:21. Gallbladder and bile ducts are unremarkable. Pancreas: Partially atrophic.  No mass.  No ductal dilatation. Spleen: No mass.  No splenomegaly. Adrenals/Urinary Tract: There is no right adrenal or renal mass enhancement, no urinary stone is seen. 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 There is prompt cortical uptake and excretion on the right, delayed cortical uptake and excretion on the left with moderate left hydronephrosis. On 6:45, level of L4-5 there is a 5 mm focus of soft tissue believed to be within the left ureter. The small ureteral filling defect could be a neoplasm versus a sloughed papilla. The pelvic left ureter is not well seen below this level. There is ill-defined enhancing soft tissue extending along the left pelvic wall down to the left posterolateral bladder wall which itself is enhancing and thickened. The pelvic left ureter may be engulfed in this enhancing soft tissue. Findings could be due to a new urothelial neoplasm with retroperitoneal spread, or other metastatic disease. There is also ill-defined abnormal enhancement and asymmetric prominence in the left iliopsoas muscle at and below the level of the left kidney which could represent further tumor involvement although an infectious complication is a further possibility. Stomach/Bowel:  Negative for dilatation or overt wall thickening. Uncomplicated sigmoid diverticulosis. Negative appendix. Vascular/Lymphatic: As above there is ill-defined soft tissue along the left pelvic sidewall inseparable from the thickened enhancing outer left bladder wall. Elsewhere no discrete adenopathy. There is aortic atherosclerosis. Ill-defined material encasing the aortoiliac bifurcation without narrowing it, could represent developing mass or phlegmon. Reproductive: Intact uterus. Left ovary obscured within the left pelvic soft tissue abnormality. Right ovary normal in size. Other: Increased body wall anasarca, increased mesenteric congestive change, small volume perihepatic and pelvic ascites. No free air. Musculoskeletal: Chronic fixation hardware proximal left femur. Levoscoliosis and degenerative change lumbar spine. Mild compression fracture L5 probably pathologic with diffuse sclerotic vertebral metastatic involvement seen again at this level. Additional less extensive sclerotic metastases in the remainder of the lumbar spine. Interbody ankylosis chronically T12-L1. Increased metastatic sclerosis L3 body, L1 body. There is asymmetric  edema in the visualized left lower extremity. No deep space collections or soft tissue gas. IVC: Patent. Portal and mesenteric veins: Patent. Bilateral iliac veins: Patent on the right. On the left some enhancement is seen at least in the proximal common iliac vein. The iliac venous bifurcation is obscured within the abnormal soft tissue in the left hemipelvis. Left internal iliac vein is not seen proximally and is likely proximally thrombosed. The proximal left external iliac vein is likely also encased by tumor and thrombosed. In the posterior left pelvic sidewall the internal iliac vein and tributaries do show enhancement. The visualized left external iliac vein is completely thrombosed. 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 The greater saphenous vein demonstrates preserved enhancement. IMPRESSION: 1. 2 small-vessel right upper lobe arterial emboli. No other embolus is seen. No findings of acute right heart strain. 2. Moderate left and small right pleural effusions, unchanged. 3. Right lower lobe interstitial changes which be chronic or due to mild edema. 4. Stable cardiomegaly with biatrial chamber predominance. Query chronic right heart dysfunction. 5. Innumerable new liver metastases. 6. New left-sided obstructive uropathy. 7. There appears to be a 5 mm filling defect in the mid left ureter which could be a sloughed papilla or neoplasm. 8. Below this level, new abnormal ill-defined soft tissue thickening around the aortic bifurcation continuing along the left pelvic sidewall down to an abnormal new thickening and enhancement of the left side of the bladder. The left ureter is probably engulfed within this. The proximal internal and external iliac veins are likely also encased. Findings could be due to new urothelial neoplasm or metastatic disease to the left pelvic wall and bladder. 9. Further abnormal heterogeneous enhancement and asymmetric enlargement of the left iliopsoas muscle, which could indicate tumor involvement or infectious complication. 10. DVT in the left external iliac vein, common iliac vein, superficial and deep femoral veins and visualized calf veins. 11. 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 12. Body wall and left lower extremity edema, mild mesenteric congestive changes, and mild ascites. 13. Critical Value/emergent results were called by telephone at the time of interpretation on 12/05/2023 at 10:32 pm to provider Dr. Dean, who verbally acknowledged these results. Electronically Signed    By: Francis Quam M.D.   On: 12/05/2023 22:59    DG Knee Complete 4 Views Left Result Date: 12/05/2023 CLINICAL DATA:  Left knee pain and swelling. EXAM: LEFT KNEE - COMPLETE 4+ VIEW COMPARISON:  None Available. FINDINGS: Four views. There is a small suprapatellar bursal effusion. Mild stranding edema in the visualized left lower extremity. There is a partially visible femoral intramedullary rod. The visualized rod shows no evidence of loosening but there are multiple scattered shallow cortical defects in the visualized mid to distal left femoral shaft concerning for osteomyelitis. There is no evidence of fractures. There is meniscal chondrocalcinosis. Mild narrowing and spurring of the femorotibial joint is noted, more advanced joint space loss and osteophytosis of the patellofemoral joint. There is no evidence of erosive arthropathy.  There is osteopenia. IMPRESSION: 1. Unremarkable partially visible femoral intramedullary rod, but there are multiple scattered shallow cortical defects in the visualized mid to distal left femoral shaft worrisome for osteomyelitis. 2. Small suprapatellar bursal effusion. 3. Osteopenia and degenerative change. 4. Meniscal chondrocalcinosis. 5. Mild stranding edema  in the visualized left lower extremity. Electronically Signed   By: Francis Quam M.D.   On: 12/05/2023 20:11       There are no new results to review at this time.   Previous records (including but not limited to H&P, progress notes, nursing notes, TOC management) were reviewed in assessment of this patient.   Labs: CBC: Last Labs         Recent Labs  Lab 12/05/23 2000 12/05/23 2036 12/06/23 0425 12/06/23 2050 12/07/23 0626  WBC 5.5  --  6.1 5.8 5.5  NEUTROABS 4.1  --   --   --   --   HGB 14.9 16.3* 14.2 13.6 12.5  HCT 45.4 48.0* 42.8 40.8 37.3  MCV 98.5  --  98.6 96.5 97.1  PLT 155  --  145* 154 173      Basic Metabolic Panel: Last Labs       Recent Labs  Lab 12/05/23 2000  12/05/23 2036 12/06/23 2050  NA 130* 129* 132*  K 3.5 3.6 3.3*  CL 90* 94* 94*  CO2 28  --  27  GLUCOSE 100* 98 144*  BUN 14 17 16   CREATININE 1.32* 1.40* 1.44*  CALCIUM  9.2  --  9.4      Liver Function Tests: Last Labs      Recent Labs  Lab 12/05/23 2000 12/06/23 2050  AST 73* 68*  ALT 34 33  ALKPHOS 131* 114  BILITOT 1.5* 1.3*  PROT 6.7 5.8*  ALBUMIN  2.7* 2.4*      CBG: Last Labs  No results for input(s): GLUCAP in the last 168 hours.     Scheduled Meds:  Chlorhexidine  Gluconate Cloth  6 each Topical Daily   levETIRAcetam   500 mg Oral QHS   potassium chloride  SA  20 mEq Oral Daily   thyroid   90 mg Oral QAC breakfast        Continuous Infusions:  dextrose  5% lactated ringers  Stopped (12/07/23 0114)   heparin  700 Units/hr (12/07/23 0602)        PRN Meds:. glycopyrrolate , morphine  injection, ondansetron  **OR** ondansetron  (ZOFRAN ) IV, sodium chloride  flush, traMADol        Family Communication: Husband via telephone   Disposition: Status is: Inpatient Remains inpatient appropriate because: DVT, metastatic disease         Discharge Planning: resumption of home hospice services   Family contact: patient's husband Carlin [phone]   IDT: updated   Goals of care: clear, comfort focused care, DNR   Should patient need ambulance transport, please use [facility] as they contract this service for our active hospice patients.   Please call with any hospice questions or concerns.   Greig Basket, BSN, RN [facility] Liaison [phone]

## 2023-12-07 NOTE — Discharge Summary (Signed)
 Physician Discharge Summary   Patient: Patricia [name] MRN: [id number] DOB: 1940-07-12  Admit date:     12/05/2023  Discharge date: 12/07/23  Discharge Physician: Carliss LELON Canales   PCP: Gayle Saddie FALCON, PA-C   Recommendations at discharge:    Pt to be discharged to inpatient hospice-[facility].   If you experience worsening fever, chills, chest pain, shortness of breath, or other concerning symptoms, please call your PCP or go to the emergency department immediately.  Discharge Diagnoses: Principal Problem:   DVT (deep venous thrombosis) (HCC) Active Problems:   Malignant neoplasm of upper-outer quadrant of left breast in female, estrogen receptor positive (HCC)   Gastroesophageal reflux disease without esophagitis   LUNG CANCER, HX OF   Hypothyroidism due to medication   Chronic diastolic heart failure (HCC)   Essential hypertension   Persistent atrial fibrillation (HCC)   History of CVA (cerebrovascular accident)   CKD (chronic kidney disease), stage III (HCC)   Seizure disorder (HCC)- txed by [facility]- [facility]   Pulmonary HTN (HCC)   Breast cancer metastasized to bone, unspecified laterality (HCC)   DNR (do not resuscitate)  Resolved Problems:   * No resolved hospital problems. *   Hospital Course:  83 y.o. female with history of metastatic breast cancer, history of lung cancer, history of multiple metastasis from the breast cancer, chronic kidney disease stage III, essential hypertension, atrial fibrillation, dementia, GERD, hyperlipidemia, chronic diastolic heart failure, pulmonary hypertension, glaucoma, seizure disorder, who is from [place] brought in secondary to left lower extremity swelling.  Pt is currently on hospice.   Assessment and Plan:   RUL pulmonary embolism with extensive LLE DVT - Previously not on anticoagulation secondary to falls, no hx of bleed/stroke per husband.  Noted extensive metastatic malignancy.  CT showing small RUL PE but noting DVT in left  external iliac, common iliac, superficial and deep femoral veins.  2-3+ LLE edema.  No hypoxia or dyspnea, but does complain of intermittent leg pain.  IR to evaluated, discussed with husband whether thrombectomy/IVC filter warranted/appropriate/etc. consensus decided against invasive procedure.  Transition patient from heparin  drip to p.o. Eliquis .  Pain management with oxycodone , tramadol  as directed.  Metastatic breast cancer - Extensive and advanced.  CT noting extensive new liver metastasis and extensive abdominal metastatic disease.  No active treatment.  Patient on hospice care.  Hospice following closely.  Will DC back to hospice.   Chronic A-fib/GERD/hypothyroidism/hypertension - Resume home medication regiment.   Goals of care - Patient is hospice patient, followed by [facility].  Not going to pursue interventional thrombectomy or other advanced procedure.  CT scan noting worsening of her diffuse metastatic disease.  Consensus to transition to inpatient hospice-[facility].  Consultants: Interventional radiology, hospice Procedures performed: None Disposition: Hospice care Diet recommendation:  Discharge Diet Orders (From admission, onward)     Start     Ordered   12/07/23 0000  Diet - low sodium heart healthy        12/07/23 1617           Regular diet  DISCHARGE MEDICATION: Allergies as of 12/07/2023       Reactions   Combigan [brimonidine Tartrate-timolol ] Itching   Per patient made eyes red, sore, and sensitivity to light   Sulfa Antibiotics Hives, Rash   Sulfamethoxazole-trimethoprim Hives        Medication List     STOP taking these medications    apixaban  2.5 MG Tabs tablet Commonly known as: ELIQUIS  Replaced by: Apixaban  Starter  Pack (10mg  and 5mg )   ciprofloxacin 250 MG tablet Commonly known as: CIPRO   potassium chloride  SA 20 MEQ tablet Commonly known as: KLOR-CON  M   senna-docusate 8.6-50 MG tablet Commonly known as: Senokot-S        TAKE these medications    Apixaban  Starter Pack (10mg  and 5mg ) Commonly known as: ELIQUIS  STARTER PACK Take as directed on package: start with two-5mg  tablets twice daily for 7 days. On day 8, switch to one-5mg  tablet twice daily. Replaces: apixaban  2.5 MG Tabs tablet   digoxin  0.125 MG tablet Commonly known as: LANOXIN  Take 1 tablet (0.125 mg total) by mouth daily.   fluticasone  50 MCG/ACT nasal spray Commonly known as: FLONASE  1 spray each nostril following sinus rinses twice daily   furosemide  40 MG tablet Commonly known as: LASIX  Take 40 mg by mouth 2 (two) times daily.   gabapentin  400 MG capsule Commonly known as: NEURONTIN  Take 400 mg by mouth 2 (two) times daily.   haloperidol  0.5 MG tablet Commonly known as: HALDOL  Take 1 tablet (0.5 mg total) by mouth every 4 (four) hours as needed for agitation (or delirium).   latanoprost  0.005 % ophthalmic solution Commonly known as: XALATAN  Place 1 drop into both eyes at bedtime.   levETIRAcetam  500 MG 24 hr tablet Commonly known as: KEPPRA  XR TAKE 1 TABLET AT BEDTIME   lidocaine  4 % Place 1 patch onto the skin daily.   NP Thyroid  90 MG tablet Generic drug: thyroid  Take 90 mg by mouth daily.   ondansetron  4 MG tablet Commonly known as: ZOFRAN  Take 1 tablet (4 mg total) by mouth every 6 (six) hours as needed for nausea.   oxyCODONE  5 MG immediate release tablet Commonly known as: Oxy IR/ROXICODONE  Take 1 tablet (5 mg total) by mouth every 4 (four) hours as needed for severe pain (pain score 7-10).   pantoprazole  40 MG tablet Commonly known as: PROTONIX  Take 40 mg by mouth daily.   spironolactone  25 MG tablet Commonly known as: ALDACTONE  Take 25 mg by mouth daily.   traMADol  50 MG tablet Commonly known as: ULTRAM  Take 1 tablet (50 mg total) by mouth every 6 (six) hours as needed for moderate pain (pain score 4-6). What changed: reasons to take this        Follow-up Information     [facility]  Follow up.   Specialty: Hospice and Palliative Medicine Why: [facility] will continue to provide home hospice services. Contact information: [address] [place]  [address] [phone]                Discharge Exam: Fredricka Weights   12/07/23 1216  Weight: 51.7 kg    GENERAL:  Alert, pleasant, no acute distress, cachectic HEENT:  EOMI CARDIOVASCULAR:  RRR, no murmurs appreciated RESPIRATORY:  Clear to auscultation, no wheezing, rales, or rhonchi GASTROINTESTINAL:  Soft, nontender, nondistended EXTREMITIES: 2-3+ LLE pitting edema NEURO:  No new focal deficits appreciated SKIN:  No rashes noted PSYCH:  Appropriate mood and affect    Condition at discharge: stable  The results of significant diagnostics from this hospitalization (including imaging, microbiology, ancillary and laboratory) are listed below for reference.   Imaging Studies: VAS US  LOWER EXTREMITY VENOUS (DVT) (ONLY [facility] & [facility]) Result Date: 12/06/2023  Lower Venous DVT Study Patient Name:  SUMAYYA MUHA  Date of Exam:   12/06/2023 Medical Rec #: [id number]    Accession #:    [id number] Date of Birth: [birth year]  Patient Gender: F Patient Age:   89 years Exam Location:  [facility] Procedure:      VAS US  LOWER EXTREMITY VENOUS (DVT) Referring Phys: LAMAR PATERSON --------------------------------------------------------------------------------  Indications: Pain, Swelling, and H/O breast and lung cancer. Other Indications: + CT for DVT. Comparison Study: No prior exam. Performing Technologist: Edilia Elden Appl  Examination Guidelines: A complete evaluation includes B-mode imaging, spectral Doppler, color Doppler, and power Doppler as needed of all accessible portions of each vessel. Bilateral testing is considered an integral part of a complete examination. Limited examinations for reoccurring indications may be performed as noted. The reflux portion of the exam is performed with the  patient in reverse Trendelenburg.  +-----+---------------+---------+-----------+----------+--------------+ RIGHTCompressibilityPhasicitySpontaneityPropertiesThrombus Aging +-----+---------------+---------+-----------+----------+--------------+ CFV  Full           Yes      Yes                                 +-----+---------------+---------+-----------+----------+--------------+ SFJ  Full           Yes      Yes                                 +-----+---------------+---------+-----------+----------+--------------+   +---------+---------------+---------+-----------+----------+--------------+ LEFT     CompressibilityPhasicitySpontaneityPropertiesThrombus Aging +---------+---------------+---------+-----------+----------+--------------+ CFV      None           No       No                                  +---------+---------------+---------+-----------+----------+--------------+ SFJ      None           No       No                                  +---------+---------------+---------+-----------+----------+--------------+ FV Prox  None           No       No                                  +---------+---------------+---------+-----------+----------+--------------+ FV Mid   None           No       No                                  +---------+---------------+---------+-----------+----------+--------------+ FV DistalNone           No       No                                  +---------+---------------+---------+-----------+----------+--------------+ PFV      None           No       No                                  +---------+---------------+---------+-----------+----------+--------------+ POP      None  No       No                                  +---------+---------------+---------+-----------+----------+--------------+ PTV      Full           No       No                                   +---------+---------------+---------+-----------+----------+--------------+ PERO     Partial        No       No                                  +---------+---------------+---------+-----------+----------+--------------+ Soleal   None           No       No                                  +---------+---------------+---------+-----------+----------+--------------+ Gastroc  None           No       No                                  +---------+---------------+---------+-----------+----------+--------------+ CIV                     Yes      Yes                  Not visualized +---------+---------------+---------+-----------+----------+--------------+ EIV                     No       No                                  +---------+---------------+---------+-----------+----------+--------------+ Left common Iliac vein was not well visualized but had minimal flow. Left posterior tibial veins are compressible with no color or pulsed wave doppler present.    Summary: RIGHT: - No evidence of common femoral vein obstruction.   LEFT: - Findings consistent with acute deep vein thrombosis involving the left common femoral vein, SF junction, left femoral vein, left proximal profunda vein, left popliteal vein, left peroneal veins, and Common Iliac Vein and External Iliac Vein. Findings consistent with acute intramuscular thrombosis involving the left soleal veins, and left gastrocnemius veins.  *See table(s) above for measurements and observations. Electronically signed by Fonda Rim on 12/06/2023 at 3:21:22 PM.    Final    CT VENOGRAM ABD/PELVIS/LOWER EXT BILAT Result Date: 12/05/2023 CLINICAL DATA:  Left lower extremity swelling, redness and pain. Poor capillary refill. 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 EXAM: CT ANGIOGRAPHY CHEST WITH CONTRAST CT VENOGRAM ABDOMEN AND PELVIS AND  LOWER EXTREMITY BILATERAL TECHNIQUE: Multidetector CT imaging of the chest was performed using standard protocol during bolus administration of intravenous contrast. Multiplanar CT image reconstructions and MIPS were obtained to evaluate the vascular anatomy. Portal phase abdomen and pelvis and systemic Venographic phase images  of the abdomen, pelvis and lower extremities were obtained following the administration of intravenous contrast. Multiplanar reformats and maximum intensity projections were generated. RADIATION DOSE REDUCTION: This exam was performed according to the departmental dose-optimization program which includes automated exposure control, adjustment of the mA and/or kV according to patient size and/or use of iterative reconstruction technique. CONTRAST:  OMNIPAQUE  IOHEXOL  350 MG/ML SOLN COMPARISON:  The last chest x-ray was portable chest 08/20/2023, comparison made with chest, abdomen and pelvis CT with IV contrast 08/22/2023 and a PET-CT from 05/04/2023. FINDINGS: CT ANGIOGRAPHY CHEST FINDINGS Cardiovascular: There is a subsegmental right upper lobe artery to the anterior segment with nonocclusive thrombus on 5:64, and a subsegmental artery with nonocclusive thrombus in the posterior right upper lobe segment on 5:66. This is a small clot burden. No further embolus is seen. The pulmonary arteries and trunk are upper normal in caliber but unchanged, the pulmonary trunk measuring 2.7 cm. Stable cardiomegaly with a biatrial chamber predominance especially the left atrium. As before there is IVC contrast reflux which could be due to chronic right heart dysfunction, tricuspid regurgitation or rapid IV contrast injection. Small pericardial effusion is unchanged. No visible coronary calcification. Atherosclerosis in the aorta and great vessels is seen without aneurysmal dissection. Mild aortic tortuosity. Right IJ port catheter terminates in the SVC. Pulmonary veins are nondistended.  Mediastinum/nodes: Old surgical clips left axilla. No axillary or intrathoracic adenopathy is seen. Negative trachea and main bronchi. 1 cm low-density right thyroid  nodule. No follow-up imaging recommended. Negative thoracic esophagus. Lungs/pleura: Similar to the prior studies there is a moderate left and small right layering pleural effusions with adjacent compressive atelectatic effect. There are postsurgical changes in the lingula from remote wedge resection. Subpleural chronic pleural-parenchymal thickening in the anterior left temporal lobe could be due to XRT. There is mosaicism in the right lower lobe basal segments, chronic septal thickening, could relate to interstitial and ground-glass edema or chronic change. There is chronic ground-glass disease in the left upper lobe. Atelectasis is noted bilaterally. Minimal apical paraseptal emphysematous change. Musculoskeletal: Osteopenia and advanced degenerative change of the spine. Interbody ankylosis T10 and T11. There are scattered sclerotic lesions in the lumbar spine and ribcage, slightly increased from 2024 scans. No pathologic fractures. Postsurgical change left breast. CT VENOGRAM ABDOMEN AND PELVIS AND LOWER EXTREMITY BILATERAL Hepatobiliary: The liver is mildly steatotic. There are numerous new enhancing liver metastases since 08/24/2023. All segments are involved. These are too numerous to count. Some of them are solid and some of them are ring enhancing and centrally necrotic. Examples include a 1.4 cm metastasis in segment 7 on 3:12, 1.5 cm lesion laterally in segment 8 on 3:18, 1.7 cm lesion in segment 6 on 3:22, 2.4 cm mass in segment 4B on 3:20 and several tiny ones in the segments 2 and 3, for example a 9 mm lesion on 3:21. Gallbladder and bile ducts are unremarkable. Pancreas: Partially atrophic.  No mass.  No ductal dilatation. Spleen: No mass.  No splenomegaly. Adrenals/Urinary Tract: There is no right adrenal or renal mass enhancement, no  urinary stone is seen. 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 There is prompt cortical uptake and excretion on the right, delayed cortical uptake and excretion on the left with moderate left hydronephrosis. On 6:45, level of L4-5 there is a 5 mm focus of soft tissue believed to be within the left ureter. The small ureteral filling defect could be a neoplasm versus a sloughed papilla. The pelvic left ureter is  not well seen below this level. There is ill-defined enhancing soft tissue extending along the left pelvic wall down to the left posterolateral bladder wall which itself is enhancing and thickened. The pelvic left ureter may be engulfed in this enhancing soft tissue. Findings could be due to a new urothelial neoplasm with retroperitoneal spread, or other metastatic disease. There is also ill-defined abnormal enhancement and asymmetric prominence in the left iliopsoas muscle at and below the level of the left kidney which could represent further tumor involvement although an infectious complication is a further possibility. Stomach/Bowel: Negative for dilatation or overt wall thickening. Uncomplicated sigmoid diverticulosis. Negative appendix. Vascular/Lymphatic: As above there is ill-defined soft tissue along the left pelvic sidewall inseparable from the thickened enhancing outer left bladder wall. Elsewhere no discrete adenopathy. There is aortic atherosclerosis. Ill-defined material encasing the aortoiliac bifurcation without narrowing it, could represent developing mass or phlegmon. Reproductive: Intact uterus. Left ovary obscured within the left pelvic soft tissue abnormality. Right ovary normal in size. Other: Increased body wall anasarca, increased mesenteric congestive change, small volume perihepatic and pelvic ascites. No free air. Musculoskeletal: Chronic fixation hardware proximal left femur. Levoscoliosis and degenerative change lumbar spine. Mild compression fracture L5 probably pathologic  with diffuse sclerotic vertebral metastatic involvement seen again at this level. Additional less extensive sclerotic metastases in the remainder of the lumbar spine. Interbody ankylosis chronically T12-L1. Increased metastatic sclerosis L3 body, L1 body. There is asymmetric edema in the visualized left lower extremity. No deep space collections or soft tissue gas. IVC: Patent. Portal and mesenteric veins: Patent. Bilateral iliac veins: Patent on the right. On the left some enhancement is seen at least in the proximal common iliac vein. The iliac venous bifurcation is obscured within the abnormal soft tissue in the left hemipelvis. Left internal iliac vein is not seen proximally and is likely proximally thrombosed. The proximal left external iliac vein is likely also encased by tumor and thrombosed. In the posterior left pelvic sidewall the internal iliac vein and tributaries do show enhancement. The visualized left external iliac vein is completely thrombosed. Right lower extremity: No evidence for thrombus involving the common femoral, femoral, popliteal and visualized deep calf veins Left lower extremity: Extensive thrombosis noted in the common femoral, deep and superficial femoral, popliteal and visualized deep calf veins. The greater saphenous vein demonstrates preserved enhancement. IMPRESSION: 1. 2 small-vessel right upper lobe arterial emboli. No other embolus is seen. No findings of acute right heart strain. 2. Moderate left and small right pleural effusions, unchanged. 3. Right lower lobe interstitial changes which be chronic or due to mild edema. 4. Stable cardiomegaly with biatrial chamber predominance. Query chronic right heart dysfunction. 5. Innumerable new liver metastases. 6. New left-sided obstructive uropathy. 7. There appears to be a 5 mm filling defect in the mid left ureter which could be a sloughed papilla or neoplasm. 8. Below this level, new abnormal ill-defined soft tissue thickening  around the aortic bifurcation continuing along the left pelvic sidewall down to an abnormal new thickening and enhancement of the left side of the bladder. The left ureter is probably engulfed within this. The proximal internal and external iliac veins are likely also encased. Findings could be due to new urothelial neoplasm or metastatic disease to the left pelvic wall and bladder. 9. Further abnormal heterogeneous enhancement and asymmetric enlargement of the left iliopsoas muscle, which could indicate tumor involvement or infectious complication. 10. DVT in the left external iliac vein, common iliac vein, superficial and deep femoral  veins and visualized calf veins. 11. 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 12. Body wall and left lower extremity edema, mild mesenteric congestive changes, and mild ascites. 13. Critical Value/emergent results were called by telephone at the time of interpretation on 12/05/2023 at 10:32 pm to provider Dr. Dean, who verbally acknowledged these results. Electronically Signed   By: Francis Quam M.D.   On: 12/05/2023 22:59   CT Angio Chest PE W and/or Wo Contrast Result Date: 12/05/2023 CLINICAL DATA:  Left lower extremity swelling, redness and pain. Poor capillary refill. 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 EXAM: CT ANGIOGRAPHY CHEST WITH CONTRAST CT VENOGRAM ABDOMEN AND PELVIS AND LOWER EXTREMITY BILATERAL TECHNIQUE: Multidetector CT imaging of the chest was performed using standard protocol during bolus administration of intravenous contrast. Multiplanar CT image reconstructions and MIPS were obtained to evaluate the vascular anatomy. Portal phase abdomen and pelvis and systemic Venographic phase images of the abdomen, pelvis and lower extremities were obtained following the administration of intravenous contrast. Multiplanar  reformats and maximum intensity projections were generated. RADIATION DOSE REDUCTION: This exam was performed according to the departmental dose-optimization program which includes automated exposure control, adjustment of the mA and/or kV according to patient size and/or use of iterative reconstruction technique. CONTRAST:  OMNIPAQUE  IOHEXOL  350 MG/ML SOLN COMPARISON:  The last chest x-ray was portable chest 08/20/2023, comparison made with chest, abdomen and pelvis CT with IV contrast 08/22/2023 and a PET-CT from 05/04/2023. FINDINGS: CT ANGIOGRAPHY CHEST FINDINGS Cardiovascular: There is a subsegmental right upper lobe artery to the anterior segment with nonocclusive thrombus on 5:64, and a subsegmental artery with nonocclusive thrombus in the posterior right upper lobe segment on 5:66. This is a small clot burden. No further embolus is seen. The pulmonary arteries and trunk are upper normal in caliber but unchanged, the pulmonary trunk measuring 2.7 cm. Stable cardiomegaly with a biatrial chamber predominance especially the left atrium. As before there is IVC contrast reflux which could be due to chronic right heart dysfunction, tricuspid regurgitation or rapid IV contrast injection. Small pericardial effusion is unchanged. No visible coronary calcification. Atherosclerosis in the aorta and great vessels is seen without aneurysmal dissection. Mild aortic tortuosity. Right IJ port catheter terminates in the SVC. Pulmonary veins are nondistended. Mediastinum/nodes: Old surgical clips left axilla. No axillary or intrathoracic adenopathy is seen. Negative trachea and main bronchi. 1 cm low-density right thyroid  nodule. No follow-up imaging recommended. Negative thoracic esophagus. Lungs/pleura: Similar to the prior studies there is a moderate left and small right layering pleural effusions with adjacent compressive atelectatic effect. There are postsurgical changes in the lingula from remote wedge resection.  Subpleural chronic pleural-parenchymal thickening in the anterior left temporal lobe could be due to XRT. There is mosaicism in the right lower lobe basal segments, chronic septal thickening, could relate to interstitial and ground-glass edema or chronic change. There is chronic ground-glass disease in the left upper lobe. Atelectasis is noted bilaterally. Minimal apical paraseptal emphysematous change. Musculoskeletal: Osteopenia and advanced degenerative change of the spine. Interbody ankylosis T10 and T11. There are scattered sclerotic lesions in the lumbar spine and ribcage, slightly increased from 2024 scans. No pathologic fractures. Postsurgical change left breast. CT VENOGRAM ABDOMEN AND PELVIS AND LOWER EXTREMITY BILATERAL Hepatobiliary: The liver is mildly steatotic. There are numerous new enhancing liver metastases since 08/24/2023. All segments are involved. These are too  numerous to count. Some of them are solid and some of them are ring enhancing and centrally necrotic. Examples include a 1.4 cm metastasis in segment 7 on 3:12, 1.5 cm lesion laterally in segment 8 on 3:18, 1.7 cm lesion in segment 6 on 3:22, 2.4 cm mass in segment 4B on 3:20 and several tiny ones in the segments 2 and 3, for example a 9 mm lesion on 3:21. Gallbladder and bile ducts are unremarkable. Pancreas: Partially atrophic.  No mass.  No ductal dilatation. Spleen: No mass.  No splenomegaly. Adrenals/Urinary Tract: There is no right adrenal or renal mass enhancement, no urinary stone is seen. 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 There is prompt cortical uptake and excretion on the right, delayed cortical uptake and excretion on the left with moderate left hydronephrosis. On 6:45, level of L4-5 there is a 5 mm focus of soft tissue believed to be within the left ureter. The small ureteral filling defect could be a neoplasm versus a sloughed papilla. The pelvic left ureter is not well seen below this level. There is  ill-defined enhancing soft tissue extending along the left pelvic wall down to the left posterolateral bladder wall which itself is enhancing and thickened. The pelvic left ureter may be engulfed in this enhancing soft tissue. Findings could be due to a new urothelial neoplasm with retroperitoneal spread, or other metastatic disease. There is also ill-defined abnormal enhancement and asymmetric prominence in the left iliopsoas muscle at and below the level of the left kidney which could represent further tumor involvement although an infectious complication is a further possibility. Stomach/Bowel: Negative for dilatation or overt wall thickening. Uncomplicated sigmoid diverticulosis. Negative appendix. Vascular/Lymphatic: As above there is ill-defined soft tissue along the left pelvic sidewall inseparable from the thickened enhancing outer left bladder wall. Elsewhere no discrete adenopathy. There is aortic atherosclerosis. Ill-defined material encasing the aortoiliac bifurcation without narrowing it, could represent developing mass or phlegmon. Reproductive: Intact uterus. Left ovary obscured within the left pelvic soft tissue abnormality. Right ovary normal in size. Other: Increased body wall anasarca, increased mesenteric congestive change, small volume perihepatic and pelvic ascites. No free air. Musculoskeletal: Chronic fixation hardware proximal left femur. Levoscoliosis and degenerative change lumbar spine. Mild compression fracture L5 probably pathologic with diffuse sclerotic vertebral metastatic involvement seen again at this level. Additional less extensive sclerotic metastases in the remainder of the lumbar spine. Interbody ankylosis chronically T12-L1. Increased metastatic sclerosis L3 body, L1 body. There is asymmetric edema in the visualized left lower extremity. No deep space collections or soft tissue gas. IVC: Patent. Portal and mesenteric veins: Patent. Bilateral iliac veins: Patent on the right.  On the left some enhancement is seen at least in the proximal common iliac vein. The iliac venous bifurcation is obscured within the abnormal soft tissue in the left hemipelvis. Left internal iliac vein is not seen proximally and is likely proximally thrombosed. The proximal left external iliac vein is likely also encased by tumor and thrombosed. In the posterior left pelvic sidewall the internal iliac vein and tributaries do show enhancement. The visualized left external iliac vein is completely thrombosed. Right lower extremity: No evidence for thrombus involving the common femoral, femoral, popliteal and visualized deep calf veins Left lower extremity: Extensive thrombosis noted in the common femoral, deep and superficial femoral, popliteal and visualized deep calf veins. The greater saphenous vein demonstrates preserved enhancement. IMPRESSION: 1. 2 small-vessel right upper lobe arterial emboli. No other embolus is seen. No findings of acute  right heart strain. 2. Moderate left and small right pleural effusions, unchanged. 3. Right lower lobe interstitial changes which be chronic or due to mild edema. 4. Stable cardiomegaly with biatrial chamber predominance. Query chronic right heart dysfunction. 5. Innumerable new liver metastases. 6. New left-sided obstructive uropathy. 7. There appears to be a 5 mm filling defect in the mid left ureter which could be a sloughed papilla or neoplasm. 8. Below this level, new abnormal ill-defined soft tissue thickening around the aortic bifurcation continuing along the left pelvic sidewall down to an abnormal new thickening and enhancement of the left side of the bladder. The left ureter is probably engulfed within this. The proximal internal and external iliac veins are likely also encased. Findings could be due to new urothelial neoplasm or metastatic disease to the left pelvic wall and bladder. 9. Further abnormal heterogeneous enhancement and asymmetric enlargement of the  left iliopsoas muscle, which could indicate tumor involvement or infectious complication. 10. DVT in the left external iliac vein, common iliac vein, superficial and deep femoral veins and visualized calf veins. 11. 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 12. Body wall and left lower extremity edema, mild mesenteric congestive changes, and mild ascites. 13. Critical Value/emergent results were called by telephone at the time of interpretation on 12/05/2023 at 10:32 pm to provider Dr. Dean, who verbally acknowledged these results. Electronically Signed   By: Francis Quam M.D.   On: 12/05/2023 22:59   DG Knee Complete 4 Views Left Result Date: 12/05/2023 CLINICAL DATA:  Left knee pain and swelling. EXAM: LEFT KNEE - COMPLETE 4+ VIEW COMPARISON:  None Available. FINDINGS: Four views. There is a small suprapatellar bursal effusion. Mild stranding edema in the visualized left lower extremity. There is a partially visible femoral intramedullary rod. The visualized rod shows no evidence of loosening but there are multiple scattered shallow cortical defects in the visualized mid to distal left femoral shaft concerning for osteomyelitis. There is no evidence of fractures. There is meniscal chondrocalcinosis. Mild narrowing and spurring of the femorotibial joint is noted, more advanced joint space loss and osteophytosis of the patellofemoral joint. There is no evidence of erosive arthropathy.  There is osteopenia. IMPRESSION: 1. Unremarkable partially visible femoral intramedullary rod, but there are multiple scattered shallow cortical defects in the visualized mid to distal left femoral shaft worrisome for osteomyelitis. 2. Small suprapatellar bursal effusion. 3. Osteopenia and degenerative change. 4. Meniscal chondrocalcinosis. 5. Mild stranding edema in the visualized left lower extremity. Electronically Signed   By: Francis Quam M.D.   On: 12/05/2023 20:11     Microbiology: Results for orders placed or performed during the hospital encounter of 08/20/23  Urine Culture (for pregnant, neutropenic or urologic patients or patients with an indwelling urinary catheter)     Status: Abnormal   Collection Time: 08/21/23  6:28 PM   Specimen: Urine, Clean Catch  Result Value Ref Range Status   Specimen Description   Final    URINE, CLEAN CATCH Performed at [facility], [address] [place], [place] [address]    Special Requests   Final    NONE Performed at [facility], [address] [place], [place] [address]    Culture MULTIPLE SPECIES PRESENT, SUGGEST RECOLLECTION (A)  Final   Report Status 08/24/2023 FINAL  Final  Culture, blood (Routine X 2) w Reflex to ID Panel     Status: None   Collection Time: 08/21/23  7:49 PM  Specimen: BLOOD RIGHT HAND  Result Value Ref Range Status   Specimen Description BLOOD RIGHT HAND  Final   Special Requests AEROBIC BOTTLE ONLY Blood Culture adequate volume  Final   Culture   Final    NO GROWTH 5 DAYS Performed at [facility], [address], [place], [place] [address]    Report Status 08/27/2023 FINAL  Final  Culture, blood (Routine X 2) w Reflex to ID Panel     Status: None   Collection Time: 08/21/23  7:49 PM   Specimen: BLOOD RIGHT HAND  Result Value Ref Range Status   Specimen Description BLOOD RIGHT HAND  Final   Special Requests AEROBIC BOTTLE ONLY Blood Culture adequate volume  Final   Culture   Final    NO GROWTH 5 DAYS Performed at [facility], [address], [place], [place] [address]    Report Status 08/27/2023 FINAL  Final  C Difficile Quick Screen w PCR reflex     Status: None   Collection Time: 08/22/23  3:57 AM   Specimen: STOOL  Result Value Ref Range Status   C Diff antigen NEGATIVE NEGATIVE Final   C Diff toxin NEGATIVE NEGATIVE Final   C Diff interpretation No C. difficile detected.  Final    Comment: Performed at  [facility], [address] [place], [place] [address]  Gastrointestinal Panel by PCR , Stool     Status: None   Collection Time: 08/22/23  3:57 AM   Specimen: Stool  Result Value Ref Range Status   Campylobacter species NOT DETECTED NOT DETECTED Final   Plesimonas shigelloides NOT DETECTED NOT DETECTED Final   Salmonella species NOT DETECTED NOT DETECTED Final   Yersinia enterocolitica NOT DETECTED NOT DETECTED Final   Vibrio species NOT DETECTED NOT DETECTED Final   Vibrio cholerae NOT DETECTED NOT DETECTED Final   Enteroaggregative E coli (EAEC) NOT DETECTED NOT DETECTED Final   Enteropathogenic E coli (EPEC) NOT DETECTED NOT DETECTED Final   Enterotoxigenic E coli (ETEC) NOT DETECTED NOT DETECTED Final   Shiga like toxin producing E coli (STEC) NOT DETECTED NOT DETECTED Final   Shigella/Enteroinvasive E coli (EIEC) NOT DETECTED NOT DETECTED Final   Cryptosporidium NOT DETECTED NOT DETECTED Final   Cyclospora cayetanensis NOT DETECTED NOT DETECTED Final   Entamoeba histolytica NOT DETECTED NOT DETECTED Final   Giardia lamblia NOT DETECTED NOT DETECTED Final   Adenovirus F40/41 NOT DETECTED NOT DETECTED Final   Astrovirus NOT DETECTED NOT DETECTED Final   Norovirus GI/GII NOT DETECTED NOT DETECTED Final   Rotavirus A NOT DETECTED NOT DETECTED Final   Sapovirus (I, II, IV, and V) NOT DETECTED NOT DETECTED Final    Comment: Performed at [facility], [address] [place], [place] [address]  Culture, body fluid w Gram Stain-bottle     Status: None   Collection Time: 08/23/23  2:32 PM   Specimen: Fluid  Result Value Ref Range Status   Specimen Description FLUID PLEURAL  Final   Special Requests BOTTLES DRAWN AEROBIC AND ANAEROBIC  Final   Culture   Final    NO GROWTH 5 DAYS Performed at [facility], [address] [place], [place] [address]    Report Status 08/28/2023 FINAL  Final  Gram stain     Status: None   Collection Time: 08/23/23   2:32 PM   Specimen: Fluid  Result Value Ref Range Status   Specimen Description FLUID PLEURAL  Final   Special Requests NONE  Final   Gram Stain   Final    WBC PRESENT,BOTH PMN AND MONONUCLEAR NO ORGANISMS SEEN CYTOSPIN SMEAR Performed at [facility], [address] [place], [place] [address]    Report Status 08/25/2023 FINAL  Final   *Note: Due to a large number of results and/or encounters for the requested time period, some results have not been displayed. A complete set of results can be found in Results Review.    Labs: CBC: Recent Labs  Lab 12/05/23 2000 12/05/23 2036 12/06/23 0425 12/06/23 2050 12/07/23 0626  WBC 5.5  --  6.1 5.8 5.5  NEUTROABS 4.1  --   --   --   --   HGB 14.9 16.3* 14.2 13.6 12.5  HCT 45.4 48.0* 42.8 40.8 37.3  MCV 98.5  --  98.6 96.5 97.1  PLT 155  --  145* 154 173   Basic Metabolic Panel: Recent Labs  Lab 12/05/23 2000 12/05/23 2036 12/06/23 2050  NA 130* 129* 132*  K 3.5 3.6 3.3*  CL 90* 94* 94*  CO2 28  --  27  GLUCOSE 100* 98 144*  BUN 14 17 16   CREATININE 1.32* 1.40* 1.44*  CALCIUM  9.2  --  9.4   Liver Function Tests: Recent Labs  Lab 12/05/23 2000 12/06/23 2050  AST 73* 68*  ALT 34 33  ALKPHOS 131* 114  BILITOT 1.5* 1.3*  PROT 6.7 5.8*  ALBUMIN  2.7* 2.4*   CBG: No results for input(s): GLUCAP in the last 168 hours.  Discharge time spent: 40 minutes.  Length of inpatient stay: 2 days  Signed: Carliss LELON Canales, DO [facility] 12/07/2023

## 2023-12-07 NOTE — Progress Notes (Signed)
 Progress Note   Patient: Patricia [name] FMW:[id number] DOB: 1940-09-13 DOA: 12/05/2023  DOS: the patient was seen and examined on 12/07/2023   Brief hospital course:  83 y.o. female with history of metastatic breast cancer, history of lung cancer, history of multiple metastasis from the breast cancer, chronic kidney disease stage III, essential hypertension, atrial fibrillation, dementia, GERD, hyperlipidemia, chronic diastolic heart failure, pulmonary hypertension, glaucoma, seizure disorder, who is from [place] brought in secondary to left lower extremity swelling.  Pt is currently on hospice.   Assessment and Plan:   RUL pulmonary embolism with extensive LLE DVT - Previously not on anticoagulation secondary to falls, no hx of bleed/stroke per husband.  Noted extensive metastatic malignancy.  CT showing small RUL PE but noting DVT in left external iliac, common iliac, superficial and deep femoral veins.  2-3+ LLE edema.  No hypoxia or dyspnea, but does complain of intermittent leg pain.  IR to evaluate later this morning to discuss with husband whether thrombectomy/IVC filter warranted/appropriate/etc.  Currently on heparin  drip.  Doppler confirming DVT.   Metastatic breast cancer - Extensive and advanced.  CT noting extensive new liver metastasis and extensive abdominal metastatic disease.  No active treatment.  Patient on hospice care.  Hospice following closely.  Will DC back to hospice eventually.   Chronic A-fib/GERD/hypothyroidism/hypertension - Resume home medication regiment.   Goals of care - Patient is hospice patient, followed by [facility].  Determining whether appropriate for procedure or not.  Likely to be discharged back to hospice care, timing dependant on decision/procedure.   Subjective: Patient resting comfortably this morning.  Intermittently complaining of left lower extremity pain but this morning says she feels okay.  Denies any fever, chills, shortness of breath,  chest pain, nausea, vomiting, abdominal pain.  Still has left lower extremity swelling.  Was able to update and talk to the husband over the phone.  Patient was not on anticoagulation purely due to frailty and risk of fall.  Was on Eliquis  prior.  Updated him on her current situation, discussion about benefits and risk of thrombectomy, whether it is palliative or necessary at all.  Husband was appreciative of the information.  Stated he will come to the hospital later today to talk with interventional radiologist.  Physical Exam:  Vitals:   12/06/23 1644 12/06/23 2026 12/07/23 0507 12/07/23 0731  BP: (P) 122/69 122/66 120/73 (!) 110/59  Pulse: (P) 75 75 71 71  Resp:  18 17   Temp:  97.9 F (36.6 C) 98.5 F (36.9 C) 97.7 F (36.5 C)  TempSrc:  Oral Oral Oral  SpO2: (P) 99% 96% 99% 98%    GENERAL:  Alert, pleasant, no acute distress, cachectic HEENT:  EOMI CARDIOVASCULAR:  RRR, no murmurs appreciated RESPIRATORY:  Clear to auscultation, no wheezing, rales, or rhonchi GASTROINTESTINAL:  Soft, nontender, nondistended EXTREMITIES: 2-3+ LLE pitting edema NEURO:  No new focal deficits appreciated SKIN:  No rashes noted PSYCH:  Appropriate mood and affect    Data Reviewed:  Imaging Studies: VAS US  LOWER EXTREMITY VENOUS (DVT) (ONLY [facility] & [facility]) Result Date: 12/06/2023  Lower Venous DVT Study Patient Name:  Patricia [name]  Date of Exam:   12/06/2023 Medical Rec #: [id number]    Accession #:    [id number] Date of Birth: 1940/03/07    Patient Gender: F Patient Age:   19 years Exam Location:  [facility] Procedure:      VAS US  LOWER EXTREMITY VENOUS (DVT) Referring Phys: LAMAR PATERSON --------------------------------------------------------------------------------  Indications: Pain, Swelling, and H/O breast and lung cancer. Other Indications: + CT for DVT. Comparison Study: No prior exam. Performing Technologist: Edilia Elden Appl  Examination Guidelines: A complete evaluation includes  B-mode imaging, spectral Doppler, color Doppler, and power Doppler as needed of all accessible portions of each vessel. Bilateral testing is considered an integral part of a complete examination. Limited examinations for reoccurring indications may be performed as noted. The reflux portion of the exam is performed with the patient in reverse Trendelenburg.  +-----+---------------+---------+-----------+----------+--------------+ RIGHTCompressibilityPhasicitySpontaneityPropertiesThrombus Aging +-----+---------------+---------+-----------+----------+--------------+ CFV  Full           Yes      Yes                                 +-----+---------------+---------+-----------+----------+--------------+ SFJ  Full           Yes      Yes                                 +-----+---------------+---------+-----------+----------+--------------+   +---------+---------------+---------+-----------+----------+--------------+ LEFT     CompressibilityPhasicitySpontaneityPropertiesThrombus Aging +---------+---------------+---------+-----------+----------+--------------+ CFV      None           No       No                                  +---------+---------------+---------+-----------+----------+--------------+ SFJ      None           No       No                                  +---------+---------------+---------+-----------+----------+--------------+ FV Prox  None           No       No                                  +---------+---------------+---------+-----------+----------+--------------+ FV Mid   None           No       No                                  +---------+---------------+---------+-----------+----------+--------------+ FV DistalNone           No       No                                  +---------+---------------+---------+-----------+----------+--------------+ PFV      None           No       No                                   +---------+---------------+---------+-----------+----------+--------------+ POP      None           No       No                                  +---------+---------------+---------+-----------+----------+--------------+  PTV      Full           No       No                                  +---------+---------------+---------+-----------+----------+--------------+ PERO     Partial        No       No                                  +---------+---------------+---------+-----------+----------+--------------+ Soleal   None           No       No                                  +---------+---------------+---------+-----------+----------+--------------+ Gastroc  None           No       No                                  +---------+---------------+---------+-----------+----------+--------------+ CIV                     Yes      Yes                  Not visualized +---------+---------------+---------+-----------+----------+--------------+ EIV                     No       No                                  +---------+---------------+---------+-----------+----------+--------------+ Left common Iliac vein was not well visualized but had minimal flow. Left posterior tibial veins are compressible with no color or pulsed wave doppler present.    Summary: RIGHT: - No evidence of common femoral vein obstruction.   LEFT: - Findings consistent with acute deep vein thrombosis involving the left common femoral vein, SF junction, left femoral vein, left proximal profunda vein, left popliteal vein, left peroneal veins, and Common Iliac Vein and External Iliac Vein. Findings consistent with acute intramuscular thrombosis involving the left soleal veins, and left gastrocnemius veins.  *See table(s) above for measurements and observations. Electronically signed by Fonda Rim on 12/06/2023 at 3:21:22 PM.    Final    CT VENOGRAM ABD/PELVIS/LOWER EXT BILAT Result Date: 12/05/2023 CLINICAL  DATA:  Left lower extremity swelling, redness and pain. Poor capillary refill. 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 EXAM: CT ANGIOGRAPHY CHEST WITH CONTRAST CT VENOGRAM ABDOMEN AND PELVIS AND LOWER EXTREMITY BILATERAL TECHNIQUE: Multidetector CT imaging of the chest was performed using standard protocol during bolus administration of intravenous contrast. Multiplanar CT image reconstructions and MIPS were obtained to evaluate the vascular anatomy. Portal phase abdomen and pelvis and systemic Venographic phase images of the abdomen, pelvis and lower extremities were obtained following the administration of intravenous contrast. Multiplanar reformats and maximum intensity projections were generated. RADIATION DOSE REDUCTION: This exam was performed according to the departmental dose-optimization program which includes automated exposure control, adjustment of  the mA and/or kV according to patient size and/or use of iterative reconstruction technique. CONTRAST:  OMNIPAQUE  IOHEXOL  350 MG/ML SOLN COMPARISON:  The last chest x-ray was portable chest 08/20/2023, comparison made with chest, abdomen and pelvis CT with IV contrast 08/22/2023 and a PET-CT from 05/04/2023. FINDINGS: CT ANGIOGRAPHY CHEST FINDINGS Cardiovascular: There is a subsegmental right upper lobe artery to the anterior segment with nonocclusive thrombus on 5:64, and a subsegmental artery with nonocclusive thrombus in the posterior right upper lobe segment on 5:66. This is a small clot burden. No further embolus is seen. The pulmonary arteries and trunk are upper normal in caliber but unchanged, the pulmonary trunk measuring 2.7 cm. Stable cardiomegaly with a biatrial chamber predominance especially the left atrium. As before there is IVC contrast reflux which could be due to chronic right heart dysfunction, tricuspid regurgitation or rapid IV  contrast injection. Small pericardial effusion is unchanged. No visible coronary calcification. Atherosclerosis in the aorta and great vessels is seen without aneurysmal dissection. Mild aortic tortuosity. Right IJ port catheter terminates in the SVC. Pulmonary veins are nondistended. Mediastinum/nodes: Old surgical clips left axilla. No axillary or intrathoracic adenopathy is seen. Negative trachea and main bronchi. 1 cm low-density right thyroid  nodule. No follow-up imaging recommended. Negative thoracic esophagus. Lungs/pleura: Similar to the prior studies there is a moderate left and small right layering pleural effusions with adjacent compressive atelectatic effect. There are postsurgical changes in the lingula from remote wedge resection. Subpleural chronic pleural-parenchymal thickening in the anterior left temporal lobe could be due to XRT. There is mosaicism in the right lower lobe basal segments, chronic septal thickening, could relate to interstitial and ground-glass edema or chronic change. There is chronic ground-glass disease in the left upper lobe. Atelectasis is noted bilaterally. Minimal apical paraseptal emphysematous change. Musculoskeletal: Osteopenia and advanced degenerative change of the spine. Interbody ankylosis T10 and T11. There are scattered sclerotic lesions in the lumbar spine and ribcage, slightly increased from 2024 scans. No pathologic fractures. Postsurgical change left breast. CT VENOGRAM ABDOMEN AND PELVIS AND LOWER EXTREMITY BILATERAL Hepatobiliary: The liver is mildly steatotic. There are numerous new enhancing liver metastases since 08/24/2023. All segments are involved. These are too numerous to count. Some of them are solid and some of them are ring enhancing and centrally necrotic. Examples include a 1.4 cm metastasis in segment 7 on 3:12, 1.5 cm lesion laterally in segment 8 on 3:18, 1.7 cm lesion in segment 6 on 3:22, 2.4 cm mass in segment 4B on 3:20 and several tiny ones  in the segments 2 and 3, for example a 9 mm lesion on 3:21. Gallbladder and bile ducts are unremarkable. Pancreas: Partially atrophic.  No mass.  No ductal dilatation. Spleen: No mass.  No splenomegaly. Adrenals/Urinary Tract: There is no right adrenal or renal mass enhancement, no urinary stone is seen. 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 There is prompt cortical uptake and excretion on the right, delayed cortical uptake and excretion on the left with moderate left hydronephrosis. On 6:45, level of L4-5 there is a 5 mm focus of soft tissue believed to be within the left ureter. The small ureteral filling defect could be a neoplasm versus a sloughed papilla. The pelvic left ureter is not well seen below this level. There is ill-defined enhancing soft tissue extending along the left pelvic wall down to the left posterolateral bladder wall which itself is enhancing and thickened. The pelvic left ureter may be engulfed in this enhancing soft tissue.  Findings could be due to a new urothelial neoplasm with retroperitoneal spread, or other metastatic disease. There is also ill-defined abnormal enhancement and asymmetric prominence in the left iliopsoas muscle at and below the level of the left kidney which could represent further tumor involvement although an infectious complication is a further possibility. Stomach/Bowel: Negative for dilatation or overt wall thickening. Uncomplicated sigmoid diverticulosis. Negative appendix. Vascular/Lymphatic: As above there is ill-defined soft tissue along the left pelvic sidewall inseparable from the thickened enhancing outer left bladder wall. Elsewhere no discrete adenopathy. There is aortic atherosclerosis. Ill-defined material encasing the aortoiliac bifurcation without narrowing it, could represent developing mass or phlegmon. Reproductive: Intact uterus. Left ovary obscured within the left pelvic soft tissue abnormality. Right ovary normal in size. Other: Increased  body wall anasarca, increased mesenteric congestive change, small volume perihepatic and pelvic ascites. No free air. Musculoskeletal: Chronic fixation hardware proximal left femur. Levoscoliosis and degenerative change lumbar spine. Mild compression fracture L5 probably pathologic with diffuse sclerotic vertebral metastatic involvement seen again at this level. Additional less extensive sclerotic metastases in the remainder of the lumbar spine. Interbody ankylosis chronically T12-L1. Increased metastatic sclerosis L3 body, L1 body. There is asymmetric edema in the visualized left lower extremity. No deep space collections or soft tissue gas. IVC: Patent. Portal and mesenteric veins: Patent. Bilateral iliac veins: Patent on the right. On the left some enhancement is seen at least in the proximal common iliac vein. The iliac venous bifurcation is obscured within the abnormal soft tissue in the left hemipelvis. Left internal iliac vein is not seen proximally and is likely proximally thrombosed. The proximal left external iliac vein is likely also encased by tumor and thrombosed. In the posterior left pelvic sidewall the internal iliac vein and tributaries do show enhancement. The visualized left external iliac vein is completely thrombosed. Right lower extremity: No evidence for thrombus involving the common femoral, femoral, popliteal and visualized deep calf veins Left lower extremity: Extensive thrombosis noted in the common femoral, deep and superficial femoral, popliteal and visualized deep calf veins. The greater saphenous vein demonstrates preserved enhancement. IMPRESSION: 1. 2 small-vessel right upper lobe arterial emboli. No other embolus is seen. No findings of acute right heart strain. 2. Moderate left and small right pleural effusions, unchanged. 3. Right lower lobe interstitial changes which be chronic or due to mild edema. 4. Stable cardiomegaly with biatrial chamber predominance. Query chronic right  heart dysfunction. 5. Innumerable new liver metastases. 6. New left-sided obstructive uropathy. 7. There appears to be a 5 mm filling defect in the mid left ureter which could be a sloughed papilla or neoplasm. 8. Below this level, new abnormal ill-defined soft tissue thickening around the aortic bifurcation continuing along the left pelvic sidewall down to an abnormal new thickening and enhancement of the left side of the bladder. The left ureter is probably engulfed within this. The proximal internal and external iliac veins are likely also encased. Findings could be due to new urothelial neoplasm or metastatic disease to the left pelvic wall and bladder. 9. Further abnormal heterogeneous enhancement and asymmetric enlargement of the left iliopsoas muscle, which could indicate tumor involvement or infectious complication. 10. DVT in the left external iliac vein, common iliac vein, superficial and deep femoral veins and visualized calf veins. 11. 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 12. Body wall and left lower extremity edema, mild mesenteric congestive changes, and mild ascites. 13. Critical Value/emergent results were called by telephone at the time  of interpretation on 12/05/2023 at 10:32 pm to provider Dr. Dean, who verbally acknowledged these results. Electronically Signed   By: Francis Quam M.D.   On: 12/05/2023 22:59   CT Angio Chest PE W and/or Wo Contrast Result Date: 12/05/2023 CLINICAL DATA:  Left lower extremity swelling, redness and pain. Poor capillary refill. 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 EXAM: CT ANGIOGRAPHY CHEST WITH CONTRAST CT VENOGRAM ABDOMEN AND PELVIS AND LOWER EXTREMITY BILATERAL TECHNIQUE: Multidetector CT imaging of the chest was performed using standard protocol during bolus administration of intravenous contrast. Multiplanar  CT image reconstructions and MIPS were obtained to evaluate the vascular anatomy. Portal phase abdomen and pelvis and systemic Venographic phase images of the abdomen, pelvis and lower extremities were obtained following the administration of intravenous contrast. Multiplanar reformats and maximum intensity projections were generated. RADIATION DOSE REDUCTION: This exam was performed according to the departmental dose-optimization program which includes automated exposure control, adjustment of the mA and/or kV according to patient size and/or use of iterative reconstruction technique. CONTRAST:  OMNIPAQUE  IOHEXOL  350 MG/ML SOLN COMPARISON:  The last chest x-ray was portable chest 08/20/2023, comparison made with chest, abdomen and pelvis CT with IV contrast 08/22/2023 and a PET-CT from 05/04/2023. FINDINGS: CT ANGIOGRAPHY CHEST FINDINGS Cardiovascular: There is a subsegmental right upper lobe artery to the anterior segment with nonocclusive thrombus on 5:64, and a subsegmental artery with nonocclusive thrombus in the posterior right upper lobe segment on 5:66. This is a small clot burden. No further embolus is seen. The pulmonary arteries and trunk are upper normal in caliber but unchanged, the pulmonary trunk measuring 2.7 cm. Stable cardiomegaly with a biatrial chamber predominance especially the left atrium. As before there is IVC contrast reflux which could be due to chronic right heart dysfunction, tricuspid regurgitation or rapid IV contrast injection. Small pericardial effusion is unchanged. No visible coronary calcification. Atherosclerosis in the aorta and great vessels is seen without aneurysmal dissection. Mild aortic tortuosity. Right IJ port catheter terminates in the SVC. Pulmonary veins are nondistended. Mediastinum/nodes: Old surgical clips left axilla. No axillary or intrathoracic adenopathy is seen. Negative trachea and main bronchi. 1 cm low-density right thyroid  nodule. No follow-up  imaging recommended. Negative thoracic esophagus. Lungs/pleura: Similar to the prior studies there is a moderate left and small right layering pleural effusions with adjacent compressive atelectatic effect. There are postsurgical changes in the lingula from remote wedge resection. Subpleural chronic pleural-parenchymal thickening in the anterior left temporal lobe could be due to XRT. There is mosaicism in the right lower lobe basal segments, chronic septal thickening, could relate to interstitial and ground-glass edema or chronic change. There is chronic ground-glass disease in the left upper lobe. Atelectasis is noted bilaterally. Minimal apical paraseptal emphysematous change. Musculoskeletal: Osteopenia and advanced degenerative change of the spine. Interbody ankylosis T10 and T11. There are scattered sclerotic lesions in the lumbar spine and ribcage, slightly increased from 2024 scans. No pathologic fractures. Postsurgical change left breast. CT VENOGRAM ABDOMEN AND PELVIS AND LOWER EXTREMITY BILATERAL Hepatobiliary: The liver is mildly steatotic. There are numerous new enhancing liver metastases since 08/24/2023. All segments are involved. These are too numerous to count. Some of them are solid and some of them are ring enhancing and centrally necrotic. Examples include a 1.4 cm metastasis in segment 7 on 3:12, 1.5 cm lesion laterally in segment 8 on 3:18, 1.7 cm lesion in segment  6 on 3:22, 2.4 cm mass in segment 4B on 3:20 and several tiny ones in the segments 2 and 3, for example a 9 mm lesion on 3:21. Gallbladder and bile ducts are unremarkable. Pancreas: Partially atrophic.  No mass.  No ductal dilatation. Spleen: No mass.  No splenomegaly. Adrenals/Urinary Tract: There is no right adrenal or renal mass enhancement, no urinary stone is seen. 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 There is prompt cortical uptake and excretion on the right, delayed cortical uptake and excretion on the left with  moderate left hydronephrosis. On 6:45, level of L4-5 there is a 5 mm focus of soft tissue believed to be within the left ureter. The small ureteral filling defect could be a neoplasm versus a sloughed papilla. The pelvic left ureter is not well seen below this level. There is ill-defined enhancing soft tissue extending along the left pelvic wall down to the left posterolateral bladder wall which itself is enhancing and thickened. The pelvic left ureter may be engulfed in this enhancing soft tissue. Findings could be due to a new urothelial neoplasm with retroperitoneal spread, or other metastatic disease. There is also ill-defined abnormal enhancement and asymmetric prominence in the left iliopsoas muscle at and below the level of the left kidney which could represent further tumor involvement although an infectious complication is a further possibility. Stomach/Bowel: Negative for dilatation or overt wall thickening. Uncomplicated sigmoid diverticulosis. Negative appendix. Vascular/Lymphatic: As above there is ill-defined soft tissue along the left pelvic sidewall inseparable from the thickened enhancing outer left bladder wall. Elsewhere no discrete adenopathy. There is aortic atherosclerosis. Ill-defined material encasing the aortoiliac bifurcation without narrowing it, could represent developing mass or phlegmon. Reproductive: Intact uterus. Left ovary obscured within the left pelvic soft tissue abnormality. Right ovary normal in size. Other: Increased body wall anasarca, increased mesenteric congestive change, small volume perihepatic and pelvic ascites. No free air. Musculoskeletal: Chronic fixation hardware proximal left femur. Levoscoliosis and degenerative change lumbar spine. Mild compression fracture L5 probably pathologic with diffuse sclerotic vertebral metastatic involvement seen again at this level. Additional less extensive sclerotic metastases in the remainder of the lumbar spine. Interbody  ankylosis chronically T12-L1. Increased metastatic sclerosis L3 body, L1 body. There is asymmetric edema in the visualized left lower extremity. No deep space collections or soft tissue gas. IVC: Patent. Portal and mesenteric veins: Patent. Bilateral iliac veins: Patent on the right. On the left some enhancement is seen at least in the proximal common iliac vein. The iliac venous bifurcation is obscured within the abnormal soft tissue in the left hemipelvis. Left internal iliac vein is not seen proximally and is likely proximally thrombosed. The proximal left external iliac vein is likely also encased by tumor and thrombosed. In the posterior left pelvic sidewall the internal iliac vein and tributaries do show enhancement. The visualized left external iliac vein is completely thrombosed. Right lower extremity: No evidence for thrombus involving the common femoral, femoral, popliteal and visualized deep calf veins Left lower extremity: Extensive thrombosis noted in the common femoral, deep and superficial femoral, popliteal and visualized deep calf veins. The greater saphenous vein demonstrates preserved enhancement. IMPRESSION: 1. 2 small-vessel right upper lobe arterial emboli. No other embolus is seen. No findings of acute right heart strain. 2. Moderate left and small right pleural effusions, unchanged. 3. Right lower lobe interstitial changes which be chronic or due to mild edema. 4. Stable cardiomegaly with biatrial chamber predominance. Query chronic right heart dysfunction. 5. Innumerable new liver metastases.  6. New left-sided obstructive uropathy. 7. There appears to be a 5 mm filling defect in the mid left ureter which could be a sloughed papilla or neoplasm. 8. Below this level, new abnormal ill-defined soft tissue thickening around the aortic bifurcation continuing along the left pelvic sidewall down to an abnormal new thickening and enhancement of the left side of the bladder. The left ureter is  probably engulfed within this. The proximal internal and external iliac veins are likely also encased. Findings could be due to new urothelial neoplasm or metastatic disease to the left pelvic wall and bladder. 9. Further abnormal heterogeneous enhancement and asymmetric enlargement of the left iliopsoas muscle, which could indicate tumor involvement or infectious complication. 10. DVT in the left external iliac vein, common iliac vein, superficial and deep femoral veins and visualized calf veins. 11. 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 12. Body wall and left lower extremity edema, mild mesenteric congestive changes, and mild ascites. 13. Critical Value/emergent results were called by telephone at the time of interpretation on 12/05/2023 at 10:32 pm to provider Dr. Dean, who verbally acknowledged these results. Electronically Signed   By: Francis Quam M.D.   On: 12/05/2023 22:59   DG Knee Complete 4 Views Left Result Date: 12/05/2023 CLINICAL DATA:  Left knee pain and swelling. EXAM: LEFT KNEE - COMPLETE 4+ VIEW COMPARISON:  None Available. FINDINGS: Four views. There is a small suprapatellar bursal effusion. Mild stranding edema in the visualized left lower extremity. There is a partially visible femoral intramedullary rod. The visualized rod shows no evidence of loosening but there are multiple scattered shallow cortical defects in the visualized mid to distal left femoral shaft concerning for osteomyelitis. There is no evidence of fractures. There is meniscal chondrocalcinosis. Mild narrowing and spurring of the femorotibial joint is noted, more advanced joint space loss and osteophytosis of the patellofemoral joint. There is no evidence of erosive arthropathy.  There is osteopenia. IMPRESSION: 1. Unremarkable partially visible femoral intramedullary rod, but there are multiple scattered shallow cortical defects in the visualized mid to distal left femoral shaft  worrisome for osteomyelitis. 2. Small suprapatellar bursal effusion. 3. Osteopenia and degenerative change. 4. Meniscal chondrocalcinosis. 5. Mild stranding edema in the visualized left lower extremity. Electronically Signed   By: Francis Quam M.D.   On: 12/05/2023 20:11    There are no new results to review at this time.  Previous records (including but not limited to H&P, progress notes, nursing notes, TOC management) were reviewed in assessment of this patient.  Labs: CBC: Recent Labs  Lab 12/05/23 2000 12/05/23 2036 12/06/23 0425 12/06/23 2050 12/07/23 0626  WBC 5.5  --  6.1 5.8 5.5  NEUTROABS 4.1  --   --   --   --   HGB 14.9 16.3* 14.2 13.6 12.5  HCT 45.4 48.0* 42.8 40.8 37.3  MCV 98.5  --  98.6 96.5 97.1  PLT 155  --  145* 154 173   Basic Metabolic Panel: Recent Labs  Lab 12/05/23 2000 12/05/23 2036 12/06/23 2050  NA 130* 129* 132*  K 3.5 3.6 3.3*  CL 90* 94* 94*  CO2 28  --  27  GLUCOSE 100* 98 144*  BUN 14 17 16   CREATININE 1.32* 1.40* 1.44*  CALCIUM  9.2  --  9.4   Liver Function Tests: Recent Labs  Lab 12/05/23 2000 12/06/23 2050  AST 73* 68*  ALT 34 33  ALKPHOS 131* 114  BILITOT 1.5* 1.3*  PROT 6.7 5.8*  ALBUMIN  2.7* 2.4*   CBG: No results for input(s): GLUCAP in the last 168 hours.  Scheduled Meds:  Chlorhexidine  Gluconate Cloth  6 each Topical Daily   levETIRAcetam   500 mg Oral QHS   potassium chloride  SA  20 mEq Oral Daily   thyroid   90 mg Oral QAC breakfast   Continuous Infusions:  dextrose  5% lactated ringers  Stopped (12/07/23 0114)   heparin  700 Units/hr (12/07/23 0602)   PRN Meds:.glycopyrrolate , morphine  injection, ondansetron  **OR** ondansetron  (ZOFRAN ) IV, sodium chloride  flush, traMADol   Family Communication: Husband via telephone  Disposition: Status is: Inpatient Remains inpatient appropriate because: DVT, metastatic disease     Time spent: 40 minutes  Length of inpatient stay: 2 days  Author: Carliss LELON Canales,  DO 12/07/2023 10:59 AM  For on call review [email or web address].

## 2023-12-07 NOTE — Progress Notes (Addendum)
 83 y.o. female inpatient. History of  a fib, HTN, CKD, CHF.  CVA, dementia, metastatic breast cancer. Presented to ED at [facility] on 10.5.25 with left leg swelling. Found to have an extensive left leg DVT and new liver metastases. Venous doppler from 10.6.25 reads acute deep vein thrombosis involving the left common femoral vein, SF junction, left femoral vein, left proximal profunda vein, left popliteal vein, left peroneal vein and common iliac vain and eternal iliac veins. Findings consistent with acute intramuscular thrombosis involving the left soleal veins and left gastrocnemius veins. Patient currently on Hospice. Patient seen at bedside with IR Attending Dr. Frederic Specking. Husband present. Patient appears to be in no acute distress. Neurologically at baseline per husband. Left leg swollen. Patient reporting tenderness with palpation. After review of the imaging  including risks and benefits with Mr [employer] Mr Khun has  elected to defer any IR intervention at this time.   Recommend Team continue to medical manage.   Further plans per Primary Team / Hospice. Please call IR with questions or concerns.

## 2023-12-07 NOTE — TOC Progression Note (Addendum)
 Transition of Care [facility]) - Progression Note    Patient Details  Name: Patricia [name] MRN: [id number] Date of Birth: Jun 17, 1940  [facility] of [facility]) CM/SW Contact  Rosaline JONELLE Joe, RN Phone Number: 12/07/2023, 3:31 PM  Clinical Narrative:    CM spoke with MD and patient is continuing to require frequent IV pain medication to control Right leg pain relating to the DVT.  Amy, CM with [facility] will speak with MD with [facility] to see if patient can admit to inpatient hospice facility for care until her symptoms are managed.  Patient's spouse and Dr. Arlon are aware of the plan.  CM with IP Care management will continue to follow the patient for likely discharge to [facility] facility for care if approved for inpatient care for pain relief management.  [facility] has agreed for patient to return to the facility after pain is managed by [employer].  12/07/23 1635 - Patient was accepted to admit to [facility] tonight.  The patient's spouse was in agreement as well.  MD placed discharge and provided discharge summary.  [facility] was called and transportation was set up for transportation to the facility.  Bedside nurse called report to [facility] and [facility] packet includes the patient's DNR that is signed.                     Expected Discharge Plan and Services                                               Social Drivers of Health (SDOH) Interventions SDOH Screenings   Food Insecurity: No Food Insecurity (12/06/2023)  Housing: Low Risk  (12/06/2023)  Transportation Needs: No Transportation Needs (12/06/2023)  Utilities: Not At Risk (12/06/2023)  Alcohol  Screen: Low Risk  (07/30/2023)  Depression (PHQ2-9): Medium Risk (08/03/2023)  Financial Resource Strain: Low Risk  (07/30/2023)  Physical Activity: Inactive (07/30/2023)  Social Connections: Socially Integrated (12/06/2023)  Stress: No Stress Concern Present  (07/30/2023)  Tobacco Use: Medium Risk (12/06/2023)  Health Literacy: Adequate Health Literacy (10/29/2022)    Readmission Risk Interventions    12/06/2023    4:36 PM 08/25/2023    2:47 PM  Readmission Risk Prevention Plan  Transportation Screening Complete Complete  PCP or Specialist Appt within 5-7 Days Complete   PCP or Specialist Appt within 3-5 Days  Complete  Home Care Screening Complete   Medication Review (RN CM) Complete   HRI or Home Care Consult  Complete  Social Work Consult for Recovery Care Planning/Counseling  Complete  Palliative Care Screening  Complete  Medication Review Oceanographer)  Complete

## 2023-12-08 DIAGNOSIS — I4891 Unspecified atrial fibrillation: Secondary | ICD-10-CM | POA: Diagnosis not present

## 2023-12-08 DIAGNOSIS — C7951 Secondary malignant neoplasm of bone: Secondary | ICD-10-CM | POA: Diagnosis not present

## 2023-12-08 DIAGNOSIS — I10 Essential (primary) hypertension: Secondary | ICD-10-CM | POA: Diagnosis not present

## 2023-12-08 DIAGNOSIS — C50919 Malignant neoplasm of unspecified site of unspecified female breast: Secondary | ICD-10-CM | POA: Diagnosis not present

## 2023-12-08 DIAGNOSIS — E785 Hyperlipidemia, unspecified: Secondary | ICD-10-CM | POA: Diagnosis not present

## 2023-12-08 DIAGNOSIS — K219 Gastro-esophageal reflux disease without esophagitis: Secondary | ICD-10-CM | POA: Diagnosis not present

## 2023-12-09 DIAGNOSIS — I10 Essential (primary) hypertension: Secondary | ICD-10-CM | POA: Diagnosis not present

## 2023-12-09 DIAGNOSIS — I4891 Unspecified atrial fibrillation: Secondary | ICD-10-CM | POA: Diagnosis not present

## 2023-12-09 DIAGNOSIS — K219 Gastro-esophageal reflux disease without esophagitis: Secondary | ICD-10-CM | POA: Diagnosis not present

## 2023-12-09 DIAGNOSIS — E785 Hyperlipidemia, unspecified: Secondary | ICD-10-CM | POA: Diagnosis not present

## 2023-12-09 DIAGNOSIS — C7951 Secondary malignant neoplasm of bone: Secondary | ICD-10-CM | POA: Diagnosis not present

## 2023-12-09 DIAGNOSIS — C50919 Malignant neoplasm of unspecified site of unspecified female breast: Secondary | ICD-10-CM | POA: Diagnosis not present

## 2023-12-10 DIAGNOSIS — C7951 Secondary malignant neoplasm of bone: Secondary | ICD-10-CM | POA: Diagnosis not present

## 2023-12-10 DIAGNOSIS — E785 Hyperlipidemia, unspecified: Secondary | ICD-10-CM | POA: Diagnosis not present

## 2023-12-10 DIAGNOSIS — K219 Gastro-esophageal reflux disease without esophagitis: Secondary | ICD-10-CM | POA: Diagnosis not present

## 2023-12-10 DIAGNOSIS — C50919 Malignant neoplasm of unspecified site of unspecified female breast: Secondary | ICD-10-CM | POA: Diagnosis not present

## 2023-12-10 DIAGNOSIS — I10 Essential (primary) hypertension: Secondary | ICD-10-CM | POA: Diagnosis not present

## 2023-12-10 DIAGNOSIS — I4891 Unspecified atrial fibrillation: Secondary | ICD-10-CM | POA: Diagnosis not present

## 2023-12-11 DIAGNOSIS — I10 Essential (primary) hypertension: Secondary | ICD-10-CM | POA: Diagnosis not present

## 2023-12-11 DIAGNOSIS — E785 Hyperlipidemia, unspecified: Secondary | ICD-10-CM | POA: Diagnosis not present

## 2023-12-11 DIAGNOSIS — C7951 Secondary malignant neoplasm of bone: Secondary | ICD-10-CM | POA: Diagnosis not present

## 2023-12-11 DIAGNOSIS — I4891 Unspecified atrial fibrillation: Secondary | ICD-10-CM | POA: Diagnosis not present

## 2023-12-11 DIAGNOSIS — K219 Gastro-esophageal reflux disease without esophagitis: Secondary | ICD-10-CM | POA: Diagnosis not present

## 2023-12-11 DIAGNOSIS — C50919 Malignant neoplasm of unspecified site of unspecified female breast: Secondary | ICD-10-CM | POA: Diagnosis not present

## 2023-12-21 ENCOUNTER — Encounter: Payer: Self-pay | Admitting: Hematology & Oncology

## 2023-12-29 ENCOUNTER — Other Ambulatory Visit: Payer: Self-pay

## 2024-01-01 DEATH — deceased

## 2024-01-25 ENCOUNTER — Other Ambulatory Visit

## 2024-02-01 ENCOUNTER — Ambulatory Visit

## 2024-02-22 ENCOUNTER — Other Ambulatory Visit: Payer: Self-pay

## 2024-04-05 ENCOUNTER — Other Ambulatory Visit (HOSPITAL_COMMUNITY)
